# Patient Record
Sex: Male | Born: 1970 | Race: White | Hispanic: No | Smoking: Current every day smoker
Health system: Southern US, Community
[De-identification: ages and names within clinical notes are randomized; demographics above are authoritative.]

## PROBLEM LIST (undated history)

## (undated) ENCOUNTER — Emergency Department (HOSPITAL_COMMUNITY): Payer: Medicare Other | Source: Home / Self Care

## (undated) DIAGNOSIS — F32A Depression, unspecified: Secondary | ICD-10-CM

## (undated) DIAGNOSIS — I4901 Ventricular fibrillation: Secondary | ICD-10-CM

## (undated) DIAGNOSIS — R51 Headache: Principal | ICD-10-CM

## (undated) DIAGNOSIS — F99 Mental disorder, not otherwise specified: Secondary | ICD-10-CM

## (undated) DIAGNOSIS — I10 Essential (primary) hypertension: Secondary | ICD-10-CM

## (undated) DIAGNOSIS — F419 Anxiety disorder, unspecified: Secondary | ICD-10-CM

## (undated) DIAGNOSIS — I998 Other disorder of circulatory system: Secondary | ICD-10-CM

## (undated) DIAGNOSIS — M1990 Unspecified osteoarthritis, unspecified site: Secondary | ICD-10-CM

## (undated) DIAGNOSIS — I2510 Atherosclerotic heart disease of native coronary artery without angina pectoris: Secondary | ICD-10-CM

## (undated) DIAGNOSIS — G2581 Restless legs syndrome: Secondary | ICD-10-CM

## (undated) DIAGNOSIS — F329 Major depressive disorder, single episode, unspecified: Secondary | ICD-10-CM

## (undated) DIAGNOSIS — E119 Type 2 diabetes mellitus without complications: Secondary | ICD-10-CM

## (undated) DIAGNOSIS — K760 Fatty (change of) liver, not elsewhere classified: Secondary | ICD-10-CM

## (undated) DIAGNOSIS — K219 Gastro-esophageal reflux disease without esophagitis: Secondary | ICD-10-CM

## (undated) DIAGNOSIS — R0602 Shortness of breath: Secondary | ICD-10-CM

## (undated) DIAGNOSIS — I252 Old myocardial infarction: Secondary | ICD-10-CM

## (undated) DIAGNOSIS — E785 Hyperlipidemia, unspecified: Secondary | ICD-10-CM

## (undated) DIAGNOSIS — F319 Bipolar disorder, unspecified: Secondary | ICD-10-CM

## (undated) DIAGNOSIS — M47812 Spondylosis without myelopathy or radiculopathy, cervical region: Secondary | ICD-10-CM

## (undated) DIAGNOSIS — N189 Chronic kidney disease, unspecified: Secondary | ICD-10-CM

## (undated) DIAGNOSIS — D759 Disease of blood and blood-forming organs, unspecified: Secondary | ICD-10-CM

## (undated) HISTORY — PX: NASAL SINUS SURGERY: SHX719

## (undated) HISTORY — DX: Headache: R51

## (undated) HISTORY — DX: Type 2 diabetes mellitus without complications: E11.9

## (undated) HISTORY — DX: Depression, unspecified: F32.A

## (undated) HISTORY — DX: Spondylosis without myelopathy or radiculopathy, cervical region: M47.812

## (undated) HISTORY — DX: Hyperlipidemia, unspecified: E78.5

## (undated) HISTORY — DX: Major depressive disorder, single episode, unspecified: F32.9

---

## 2003-07-19 ENCOUNTER — Other Ambulatory Visit: Payer: Self-pay

## 2004-09-28 ENCOUNTER — Emergency Department: Payer: Self-pay | Admitting: Unknown Physician Specialty

## 2005-06-24 ENCOUNTER — Ambulatory Visit: Payer: Self-pay | Admitting: Family Medicine

## 2005-07-13 ENCOUNTER — Emergency Department: Payer: Self-pay | Admitting: Emergency Medicine

## 2005-07-13 ENCOUNTER — Other Ambulatory Visit: Payer: Self-pay

## 2005-07-15 ENCOUNTER — Emergency Department: Payer: Self-pay | Admitting: Emergency Medicine

## 2005-07-16 ENCOUNTER — Emergency Department: Payer: Self-pay | Admitting: Emergency Medicine

## 2005-07-17 ENCOUNTER — Emergency Department (HOSPITAL_COMMUNITY): Admission: EM | Admit: 2005-07-17 | Discharge: 2005-07-17 | Payer: Self-pay | Admitting: Emergency Medicine

## 2005-09-18 ENCOUNTER — Emergency Department: Payer: Self-pay | Admitting: Emergency Medicine

## 2005-11-09 ENCOUNTER — Ambulatory Visit: Payer: Self-pay | Admitting: Unknown Physician Specialty

## 2005-12-24 ENCOUNTER — Emergency Department: Payer: Self-pay | Admitting: Emergency Medicine

## 2006-01-07 ENCOUNTER — Emergency Department: Payer: Self-pay | Admitting: General Practice

## 2006-01-17 ENCOUNTER — Ambulatory Visit: Payer: Self-pay | Admitting: Unknown Physician Specialty

## 2006-01-23 ENCOUNTER — Other Ambulatory Visit: Payer: Self-pay

## 2006-01-23 ENCOUNTER — Ambulatory Visit: Payer: Self-pay | Admitting: Unknown Physician Specialty

## 2006-03-16 ENCOUNTER — Emergency Department: Payer: Self-pay | Admitting: Emergency Medicine

## 2006-04-04 ENCOUNTER — Emergency Department: Payer: Self-pay | Admitting: Emergency Medicine

## 2006-04-10 ENCOUNTER — Other Ambulatory Visit: Payer: Self-pay

## 2006-04-10 ENCOUNTER — Emergency Department: Payer: Self-pay | Admitting: Unknown Physician Specialty

## 2006-04-24 ENCOUNTER — Ambulatory Visit: Payer: Self-pay | Admitting: Otolaryngology

## 2006-04-27 ENCOUNTER — Emergency Department: Payer: Self-pay | Admitting: Internal Medicine

## 2006-06-07 ENCOUNTER — Emergency Department: Payer: Self-pay | Admitting: Emergency Medicine

## 2006-06-28 ENCOUNTER — Ambulatory Visit: Payer: Self-pay | Admitting: Maternal & Fetal Medicine

## 2006-07-20 ENCOUNTER — Emergency Department (HOSPITAL_COMMUNITY): Admission: EM | Admit: 2006-07-20 | Discharge: 2006-07-20 | Payer: Self-pay | Admitting: Emergency Medicine

## 2006-10-18 ENCOUNTER — Emergency Department: Payer: Self-pay | Admitting: Emergency Medicine

## 2006-10-20 ENCOUNTER — Emergency Department: Payer: Self-pay | Admitting: Emergency Medicine

## 2006-11-26 ENCOUNTER — Emergency Department: Payer: Self-pay | Admitting: Emergency Medicine

## 2006-12-14 ENCOUNTER — Emergency Department: Payer: Self-pay | Admitting: Emergency Medicine

## 2006-12-20 ENCOUNTER — Emergency Department (HOSPITAL_COMMUNITY): Admission: EM | Admit: 2006-12-20 | Discharge: 2006-12-20 | Payer: Self-pay | Admitting: Emergency Medicine

## 2007-01-14 ENCOUNTER — Emergency Department: Payer: Self-pay | Admitting: Emergency Medicine

## 2007-01-29 ENCOUNTER — Emergency Department: Payer: Self-pay | Admitting: Emergency Medicine

## 2007-03-13 ENCOUNTER — Emergency Department: Payer: Self-pay | Admitting: Emergency Medicine

## 2007-03-13 ENCOUNTER — Other Ambulatory Visit: Payer: Self-pay

## 2007-03-21 ENCOUNTER — Emergency Department: Payer: Self-pay | Admitting: Emergency Medicine

## 2007-03-21 ENCOUNTER — Other Ambulatory Visit: Payer: Self-pay

## 2007-04-14 ENCOUNTER — Emergency Department (HOSPITAL_COMMUNITY): Admission: EM | Admit: 2007-04-14 | Discharge: 2007-04-14 | Payer: Self-pay | Admitting: Emergency Medicine

## 2007-05-11 ENCOUNTER — Ambulatory Visit: Payer: Self-pay | Admitting: Family Medicine

## 2007-05-13 ENCOUNTER — Ambulatory Visit: Payer: Self-pay | Admitting: Family Medicine

## 2007-06-18 ENCOUNTER — Emergency Department: Payer: Self-pay | Admitting: Emergency Medicine

## 2007-08-14 ENCOUNTER — Emergency Department: Payer: Self-pay | Admitting: Internal Medicine

## 2007-08-15 ENCOUNTER — Emergency Department (HOSPITAL_COMMUNITY): Admission: EM | Admit: 2007-08-15 | Discharge: 2007-08-15 | Payer: Self-pay | Admitting: Emergency Medicine

## 2007-09-03 ENCOUNTER — Inpatient Hospital Stay: Payer: Self-pay | Admitting: Psychiatry

## 2007-10-06 ENCOUNTER — Other Ambulatory Visit: Payer: Self-pay

## 2007-10-06 ENCOUNTER — Emergency Department: Payer: Self-pay | Admitting: Emergency Medicine

## 2007-11-09 ENCOUNTER — Ambulatory Visit: Payer: Self-pay | Admitting: Internal Medicine

## 2007-11-25 ENCOUNTER — Emergency Department: Payer: Self-pay | Admitting: Emergency Medicine

## 2007-11-27 ENCOUNTER — Emergency Department: Payer: Self-pay | Admitting: Emergency Medicine

## 2008-03-02 ENCOUNTER — Emergency Department: Payer: Self-pay | Admitting: Unknown Physician Specialty

## 2008-03-22 ENCOUNTER — Emergency Department (HOSPITAL_COMMUNITY): Admission: EM | Admit: 2008-03-22 | Discharge: 2008-03-23 | Payer: Self-pay | Admitting: Emergency Medicine

## 2008-03-23 ENCOUNTER — Inpatient Hospital Stay (HOSPITAL_COMMUNITY): Admission: RE | Admit: 2008-03-23 | Discharge: 2008-03-23 | Payer: Self-pay | Admitting: *Deleted

## 2008-03-23 ENCOUNTER — Ambulatory Visit: Payer: Self-pay | Admitting: *Deleted

## 2008-08-12 ENCOUNTER — Emergency Department: Payer: Self-pay | Admitting: Internal Medicine

## 2008-09-06 ENCOUNTER — Emergency Department (HOSPITAL_COMMUNITY): Admission: EM | Admit: 2008-09-06 | Discharge: 2008-09-06 | Payer: Self-pay | Admitting: Emergency Medicine

## 2008-11-25 ENCOUNTER — Emergency Department: Payer: Self-pay | Admitting: Emergency Medicine

## 2009-01-10 ENCOUNTER — Emergency Department: Payer: Self-pay | Admitting: Internal Medicine

## 2009-02-27 ENCOUNTER — Emergency Department: Payer: Self-pay | Admitting: Internal Medicine

## 2009-04-25 ENCOUNTER — Emergency Department (HOSPITAL_COMMUNITY): Admission: EM | Admit: 2009-04-25 | Discharge: 2009-04-25 | Payer: Self-pay | Admitting: Emergency Medicine

## 2009-08-04 ENCOUNTER — Emergency Department: Payer: Self-pay | Admitting: Unknown Physician Specialty

## 2009-08-09 ENCOUNTER — Emergency Department: Payer: Self-pay | Admitting: Emergency Medicine

## 2009-08-10 ENCOUNTER — Ambulatory Visit: Payer: Self-pay | Admitting: Family Medicine

## 2009-08-11 ENCOUNTER — Emergency Department: Payer: Self-pay | Admitting: Unknown Physician Specialty

## 2009-08-12 ENCOUNTER — Emergency Department: Payer: Self-pay | Admitting: Emergency Medicine

## 2009-09-21 ENCOUNTER — Emergency Department (HOSPITAL_COMMUNITY): Admission: EM | Admit: 2009-09-21 | Discharge: 2009-09-21 | Payer: Self-pay | Admitting: Emergency Medicine

## 2009-09-23 ENCOUNTER — Emergency Department: Payer: Self-pay | Admitting: Emergency Medicine

## 2009-10-29 ENCOUNTER — Emergency Department: Payer: Self-pay | Admitting: Emergency Medicine

## 2010-05-20 ENCOUNTER — Emergency Department: Payer: Self-pay | Admitting: Emergency Medicine

## 2010-06-27 ENCOUNTER — Emergency Department: Payer: Self-pay | Admitting: Internal Medicine

## 2010-08-06 ENCOUNTER — Emergency Department: Payer: Self-pay | Admitting: Unknown Physician Specialty

## 2010-08-12 NOTE — Discharge Summary (Signed)
Michael Schmitt, Michael Schmitt             ACCOUNT NO.:  [id number]   MEDICAL RECORD NO.:  [id number]          PATIENT TYPE:  IPS   LOCATION:  0302                          FACILITY:  [facility]   PHYSICIAN:  Stark Jock, M.D. DATE OF BIRTH:  January 06, 1971   DATE OF ADMISSION:  03/23/2008  DATE OF DISCHARGE:  03/23/2008                               DISCHARGE SUMMARY   IDENTIFICATION:  This is a 40 year old single white male from [place],  [place], who was admitted on March 23, 2008 on a voluntary  basis.   HISTORY OF PRESENT ILLNESS:  The patient states father is very sick and  this has been a stress.  He has been feeling depressed.  He has been  arguing with his girlfriend.  He has not been helping around the house.  His fiancee told him he had to get help, so he came to the hospital.  He  has been having psychiatric problems since beginning panic attacks 3  years ago.  He has been treated with Klonopin and has had no more panic  attacks with the Klonopin.  He states he still worries a lot.  He had  been on Lexapro, which helped, but he cannot afford this.  He has been  on Cymbalta, which caused suicidal ideation, Wellbutrin, which caused  suicidal ideation, and Zoloft, which did not help.  He sees Dr. Collie Siad for  psychiatric treatment.  He admits that when he went to the ED, he told  the staff there he was suicidal.   PAST PSYCHIATRIC HISTORY:  The patient sees Dr. Collie Siad.  He has been  hospitalized x1 at [facility].  He has a  Social worker, named Designer, multimedia.  He has been treated with Klonopin.  He has been  treated by [facility] in [place].  For further past psychiatric history,  see above.   ALCOHOL AND DRUG HISTORY:  The patient denies.   FAMILY HISTORY:  His mother has depression.  Father was depressed to  sickness.  Grandmother was hospitalized at [facility] in the past.   MEDICAL PROBLEMS:  Gastritis, hypertension.   MEDICATIONS:  1. Atenolol 25 mg per day.  2.  Zantac.  3. Klonopin 0.5 mg p.o. b.i.d.   PHYSICAL FINDINGS:  There were no acute physical or medical problems  noted.   HOSPITAL COURSE:  Upon admission, the patient was restarted on atenolol  25 mg p.o. q. day.  He was also started on Klonopin 0.5 mg p.o. b.i.d.  and trazodone 50 mg p.o. q.h.s. p.r.n. may repeat x1 as well as a  nicotine patch 21 mg daily and Celexa 20 mg daily.  In individual  sessions, the patient was friendly and cooperative.  He stated he wanted  to go home.  He gave Education officer, museum permission to contact his Danice Goltz.  He wanted a family session prior to discharge, but social  worker explained that if the patient wants to go home today, they could  not have a family session prior to discharge due to no available times.  Social worker did speak with Threasa Beards over the phone and  answered her  questions.  Social worker explained both the patient and Threasa Beards that if  the patient wanted a formal family session, one could be scheduled for  tomorrow.  The patient was willing to hold off on discharge.  However,  they both decided it would be better to have the patient come home  today.  Social worker gave Threasa Beards and the patient the phone number to  the mental health association in [place].  He was also scheduled back  at [facility] in [place] with Ky Barban for counseling and Dr. Collie Siad for  medication management.  His fiancee, Threasa Beards was going to pick him up in  the afternoon.  He requested a letter to take home with his hospital  stay on it.  Mental status had improved.  The patient was less  depressed, less anxious.  Affect was consistent with his affect was  consistent with mood.  There was no suicidal or homicidal ideation.  No  thoughts of self-injurious behavior.  No auditory or visual  hallucinations.  No paranoia or delusions.  Thoughts were logical and  goal-directed.  Thought content no predominant theme.  Cognitive was  grossly intact.  Insight good.   Judgment good.  Impulse control good.  It was felt the patient was safe for discharge today.   DISCHARGE DIAGNOSES:  Axis I:  Mood disorder, not otherwise specified,  anxiety disorder.  Axis II:  None.  Axis III:  Hypertension and gastritis.  Axis IV:  Severe (problems with primary support, other psychosocial  problems, burden of psychiatric illness).  Axis V:  Global assessment of functioning was 50 upon discharge.  GAF  was 35 upon admission.  GAF highest past year was 89.   DISCHARGE PLANS:  There was no specific activity level or dietary  restrictions.   POSTHOSPITAL CARE PLANS:  The patient will return to see Dr. Collie Siad, at  [facility] in [place] on January 25 at 4:45 p.m.  He will also return to  [facility] Barban, [place] counselor on January 6th at 1 p.m.   DISCHARGE MEDICATIONS:  1. Celexa 20 mg daily.  2. Klonopin 0.5 mg twice a day.  3. Atenolol 25 mg daily.      Stark Jock, M.D.  Electronically Signed     BHS/MEDQ  D:  04/27/2008  T:  04/28/2008  Job:  [id number]

## 2010-09-15 ENCOUNTER — Emergency Department: Payer: Self-pay | Admitting: Emergency Medicine

## 2010-10-08 ENCOUNTER — Emergency Department: Payer: Self-pay | Admitting: Emergency Medicine

## 2010-10-16 ENCOUNTER — Emergency Department: Payer: Self-pay | Admitting: Emergency Medicine

## 2010-10-20 DIAGNOSIS — J45909 Unspecified asthma, uncomplicated: Secondary | ICD-10-CM | POA: Diagnosis present

## 2010-10-20 DIAGNOSIS — F17200 Nicotine dependence, unspecified, uncomplicated: Secondary | ICD-10-CM | POA: Insufficient documentation

## 2010-11-24 ENCOUNTER — Encounter: Payer: Self-pay | Admitting: Family Medicine

## 2010-11-26 ENCOUNTER — Encounter: Payer: Self-pay | Admitting: Family Medicine

## 2010-12-21 LAB — POCT I-STAT, CHEM 8
Calcium, Ion: 1.22
Glucose, Bld: 170 — ABNORMAL HIGH
HCT: 50
Hemoglobin: 17
Potassium: 4.1

## 2010-12-21 LAB — RAPID URINE DRUG SCREEN, HOSP PERFORMED: Opiates: NOT DETECTED

## 2010-12-21 LAB — ETHANOL

## 2010-12-25 ENCOUNTER — Emergency Department: Payer: Self-pay | Admitting: Emergency Medicine

## 2010-12-30 LAB — RAPID URINE DRUG SCREEN, HOSP PERFORMED
Amphetamines: NOT DETECTED
Benzodiazepines: POSITIVE — AB
Opiates: NOT DETECTED
Tetrahydrocannabinol: NOT DETECTED

## 2010-12-30 LAB — CBC
MCHC: 33.2 g/dL (ref 30.0–36.0)
Platelets: 182 10*3/uL (ref 150–400)
RDW: 12.9 % (ref 11.5–15.5)

## 2010-12-30 LAB — POCT I-STAT, CHEM 8
Calcium, Ion: 1.07 mmol/L — ABNORMAL LOW (ref 1.12–1.32)
Creatinine, Ser: 1 mg/dL (ref 0.4–1.5)
Glucose, Bld: 112 mg/dL — ABNORMAL HIGH (ref 70–99)
Hemoglobin: 15.3 g/dL (ref 13.0–17.0)
TCO2: 25 mmol/L (ref 0–100)

## 2010-12-30 LAB — DIFFERENTIAL
Basophils Absolute: 0 10*3/uL (ref 0.0–0.1)
Basophils Relative: 1 % (ref 0–1)
Monocytes Absolute: 0.5 10*3/uL (ref 0.1–1.0)
Neutro Abs: 3.7 10*3/uL (ref 1.7–7.7)
Neutrophils Relative %: 52 % (ref 43–77)

## 2010-12-30 LAB — ETHANOL

## 2011-01-05 LAB — URINE CULTURE: Colony Count: NO GROWTH

## 2011-01-05 LAB — COMPREHENSIVE METABOLIC PANEL
ALT: 48
Albumin: 4.1
Alkaline Phosphatase: 75
BUN: 11
Chloride: 102
Potassium: 4.4
Sodium: 139
Total Bilirubin: 0.8
Total Protein: 6.6

## 2011-01-05 LAB — DIFFERENTIAL
Basophils Relative: 1
Eosinophils Absolute: 0.2
Eosinophils Relative: 2
Lymphs Abs: 2.5
Monocytes Absolute: 0.5
Monocytes Relative: 6
Neutrophils Relative %: 59

## 2011-01-05 LAB — CBC
Hemoglobin: 15.4
MCHC: 33.9
RBC: 5.01
RDW: 13.6

## 2011-01-05 LAB — URINALYSIS, ROUTINE W REFLEX MICROSCOPIC
Bilirubin Urine: NEGATIVE
Glucose, UA: NEGATIVE
Hgb urine dipstick: NEGATIVE
Protein, ur: NEGATIVE

## 2011-01-26 ENCOUNTER — Emergency Department: Payer: Self-pay | Admitting: Emergency Medicine

## 2011-01-30 ENCOUNTER — Emergency Department: Payer: Self-pay

## 2011-04-16 ENCOUNTER — Emergency Department: Payer: Self-pay | Admitting: Emergency Medicine

## 2011-05-08 ENCOUNTER — Emergency Department: Payer: Self-pay | Admitting: Emergency Medicine

## 2011-05-17 ENCOUNTER — Emergency Department: Payer: Self-pay

## 2011-05-18 ENCOUNTER — Emergency Department: Payer: Self-pay | Admitting: Emergency Medicine

## 2011-06-06 ENCOUNTER — Ambulatory Visit: Payer: Self-pay | Admitting: Pain Medicine

## 2011-06-16 ENCOUNTER — Ambulatory Visit: Payer: Self-pay | Admitting: Pain Medicine

## 2011-06-21 ENCOUNTER — Ambulatory Visit: Payer: Self-pay | Admitting: Pain Medicine

## 2011-07-05 ENCOUNTER — Ambulatory Visit: Payer: Self-pay | Admitting: Pain Medicine

## 2011-07-19 ENCOUNTER — Ambulatory Visit: Payer: Self-pay | Admitting: Pain Medicine

## 2011-09-29 ENCOUNTER — Ambulatory Visit: Payer: Self-pay

## 2011-10-04 ENCOUNTER — Emergency Department: Payer: Self-pay | Admitting: Internal Medicine

## 2011-10-04 LAB — BASIC METABOLIC PANEL
Anion Gap: 7 (ref 7–16)
BUN: 6 mg/dL — ABNORMAL LOW (ref 7–18)
Co2: 27 mmol/L (ref 21–32)
Creatinine: 1.03 mg/dL (ref 0.60–1.30)
EGFR (African American): >60
EGFR (Non-African Amer.): >60
Sodium: 142 mmol/L (ref 136–145)

## 2011-10-04 LAB — URINALYSIS, COMPLETE
Bilirubin,UR: NEGATIVE
Blood: NEGATIVE
Ketone: NEGATIVE
Leukocyte Esterase: NEGATIVE
Ph: 6 (ref 4.5–8.0)
Protein: NEGATIVE
RBC,UR: NONE SEEN /HPF (ref 0–5)
Squamous Epithelial: <1

## 2011-10-04 LAB — CBC
HGB: 15.9 g/dL (ref 13.0–18.0)
MCH: 31.6 pg (ref 26.0–34.0)
Platelet: 202 10*3/uL (ref 150–440)
RBC: 5.03 10*6/uL (ref 4.40–5.90)

## 2011-11-01 ENCOUNTER — Other Ambulatory Visit: Payer: Self-pay | Admitting: Neurosurgery

## 2011-11-06 ENCOUNTER — Encounter (HOSPITAL_COMMUNITY): Payer: Self-pay

## 2011-11-09 ENCOUNTER — Encounter (HOSPITAL_COMMUNITY)
Admission: RE | Admit: 2011-11-09 | Discharge: 2011-11-09 | Disposition: A | Discharge status: Home / Self Care | Payer: Medicaid Other | Source: Ambulatory Visit | Attending: Neurosurgery | Admitting: Neurosurgery

## 2011-11-09 ENCOUNTER — Encounter (HOSPITAL_COMMUNITY): Payer: Self-pay

## 2011-11-09 ENCOUNTER — Ambulatory Visit (HOSPITAL_COMMUNITY)
Admission: RE | Admit: 2011-11-09 | Discharge: 2011-11-09 | Disposition: A | Discharge status: Home / Self Care | Payer: Medicaid Other | Source: Ambulatory Visit | Attending: Neurosurgery | Admitting: Neurosurgery

## 2011-11-09 DIAGNOSIS — Z01810 Encounter for preprocedural cardiovascular examination: Secondary | ICD-10-CM | POA: Insufficient documentation

## 2011-11-09 DIAGNOSIS — F17200 Nicotine dependence, unspecified, uncomplicated: Secondary | ICD-10-CM | POA: Insufficient documentation

## 2011-11-09 DIAGNOSIS — Z01812 Encounter for preprocedural laboratory examination: Secondary | ICD-10-CM | POA: Insufficient documentation

## 2011-11-09 DIAGNOSIS — Z01818 Encounter for other preprocedural examination: Secondary | ICD-10-CM | POA: Insufficient documentation

## 2011-11-09 DIAGNOSIS — I10 Essential (primary) hypertension: Secondary | ICD-10-CM | POA: Insufficient documentation

## 2011-11-09 DIAGNOSIS — K219 Gastro-esophageal reflux disease without esophagitis: Secondary | ICD-10-CM | POA: Insufficient documentation

## 2011-11-09 DIAGNOSIS — R0602 Shortness of breath: Secondary | ICD-10-CM | POA: Insufficient documentation

## 2011-11-09 HISTORY — DX: Unspecified osteoarthritis, unspecified site: M19.90

## 2011-11-09 HISTORY — DX: Gastro-esophageal reflux disease without esophagitis: K21.9

## 2011-11-09 HISTORY — DX: Headache: R51

## 2011-11-09 HISTORY — DX: Restless legs syndrome: G25.81

## 2011-11-09 HISTORY — DX: Essential (primary) hypertension: I10

## 2011-11-09 HISTORY — DX: Anxiety disorder, unspecified: F41.9

## 2011-11-09 HISTORY — DX: Chronic kidney disease, unspecified: N18.9

## 2011-11-09 HISTORY — DX: Mental disorder, not otherwise specified: F99

## 2011-11-09 HISTORY — DX: Disease of blood and blood-forming organs, unspecified: D75.9

## 2011-11-09 HISTORY — DX: Shortness of breath: R06.02

## 2011-11-09 LAB — CBC
HCT: 47.1 % (ref 39.0–52.0)
MCV: 92.2 fL (ref 78.0–100.0)
RBC: 5.11 MIL/uL (ref 4.22–5.81)
WBC: 6.7 10*3/uL (ref 4.0–10.5)

## 2011-11-09 LAB — BASIC METABOLIC PANEL
CO2: 32 mEq/L (ref 19–32)
Chloride: 101 mEq/L (ref 96–112)
Potassium: 3.7 mEq/L (ref 3.5–5.1)
Sodium: 142 mEq/L (ref 135–145)

## 2011-11-09 LAB — SURGICAL PCR SCREEN: Staphylococcus aureus: NEGATIVE

## 2011-11-09 NOTE — Progress Notes (Signed)
ekg- last one done- [facility], June 2013

## 2011-11-09 NOTE — Pre-Procedure Instructions (Signed)
[place]  11/09/2011   Your procedure is scheduled on:  11/13/2011  Report to [facility] at 11:45 AM.  Call this number if you have problems the morning of surgery: [phone]   Remember:   Do not eat food or drink liquid :After Midnight.-  TUESDAY      Take these medicines the morning of surgery with A SIP OF WATER: pain medicine if needed, atenolol, klonipin, lexapro, prilosec     Do not wear jewelry, make-up or nail polish.   Do not wear lotions, powders, or perfumes. You may wear deodorant.   Men may shave face and neck.   Do not bring valuables to the hospital.   Contacts, dentures or bridgework may not be worn into surgery.   Leave suitcase in the car. After surgery it may be brought to your room.  For patients admitted to the hospital, checkout time is 11:00 AM the day of discharge.   Patients discharged the day of surgery will not be allowed to drive home.  Name and phone number of your driver: /w family  Special Instructions: CHG Shower Use Special Wash: 1/2 bottle night before surgery and 1/2 bottle morning of surgery.   Please read over the following fact sheets that you were given: Pain Booklet, Coughing and Deep Breathing, MRSA Information and Surgical Site Infection Prevention

## 2011-11-09 NOTE — Progress Notes (Signed)
Pt. Reports that the Vicodin is not helping his pain at all. Pt. States that there is an Rx of Oxycodone waiting for him in the MD office. He also reports that he has not been taking the atenolol the last several days because he ran out & he has contacted the MD office but "they have messed up all my medicines".   Pt. Reports that he has been out of work for 2 yrs+ & can't afford some of the medicines.  Also reports lapsing on Lexapro & Remeron, states he sees a Teacher, music & a counselor but missed appt. yesterday because of pain. Pt. Admits to feeling hopeless but denies thoughts of harming himself.  States he took his normal Klonopin upon waking today & then a couple hrs. later took another because he knew he was coming to the hosp. For PAT visit & he was nervous.   Pt. Has been advised to get Atenolol filled & take as prescribed. Pt. Agrees.

## 2011-11-12 MED ORDER — CEFAZOLIN SODIUM-DEXTROSE 2-3 GM-% IV SOLR
2.0000 g | INTRAVENOUS | Status: AC
  Administered 2011-11-13: 2 g via INTRAVENOUS
  Filled 2011-11-12 (×2): qty 50

## 2011-11-13 ENCOUNTER — Encounter (HOSPITAL_COMMUNITY): Payer: Self-pay | Admitting: Certified Registered Nurse Anesthetist

## 2011-11-13 ENCOUNTER — Ambulatory Visit (HOSPITAL_COMMUNITY): Payer: Medicaid Other | Admitting: Certified Registered Nurse Anesthetist

## 2011-11-13 ENCOUNTER — Encounter (HOSPITAL_COMMUNITY)
Admission: RE | Disposition: A | Discharge status: Home / Self Care | Payer: Self-pay | Source: Ambulatory Visit | Attending: Neurosurgery

## 2011-11-13 ENCOUNTER — Ambulatory Visit (HOSPITAL_COMMUNITY): Payer: Medicaid Other

## 2011-11-13 ENCOUNTER — Encounter (HOSPITAL_COMMUNITY): Payer: Self-pay | Admitting: *Deleted

## 2011-11-13 ENCOUNTER — Ambulatory Visit (HOSPITAL_COMMUNITY)
Admission: RE | Admit: 2011-11-13 | Discharge: 2011-11-14 | Disposition: A | Discharge status: Home / Self Care | Payer: Medicaid Other | Source: Ambulatory Visit | Attending: Neurosurgery | Admitting: Neurosurgery

## 2011-11-13 DIAGNOSIS — K219 Gastro-esophageal reflux disease without esophagitis: Secondary | ICD-10-CM | POA: Insufficient documentation

## 2011-11-13 DIAGNOSIS — F17200 Nicotine dependence, unspecified, uncomplicated: Secondary | ICD-10-CM | POA: Insufficient documentation

## 2011-11-13 DIAGNOSIS — J45909 Unspecified asthma, uncomplicated: Secondary | ICD-10-CM | POA: Insufficient documentation

## 2011-11-13 DIAGNOSIS — M47812 Spondylosis without myelopathy or radiculopathy, cervical region: Secondary | ICD-10-CM | POA: Insufficient documentation

## 2011-11-13 DIAGNOSIS — I10 Essential (primary) hypertension: Secondary | ICD-10-CM | POA: Insufficient documentation

## 2011-11-13 HISTORY — PX: ANTERIOR CERVICAL DECOMP/DISCECTOMY FUSION: SHX1161

## 2011-11-13 SURGERY — ANTERIOR CERVICAL DECOMPRESSION/DISCECTOMY FUSION 1 LEVEL
Anesthesia: General | Wound class: Clean

## 2011-11-13 MED ORDER — CEFAZOLIN SODIUM 1-5 GM-% IV SOLN
1.0000 g | Freq: Three times a day (TID) | INTRAVENOUS | Status: AC
  Administered 2011-11-13 – 2011-11-14 (×2): 1 g via INTRAVENOUS
  Filled 2011-11-13 (×2): qty 50

## 2011-11-13 MED ORDER — PROPOFOL 10 MG/ML IV BOLUS
INTRAVENOUS | Status: DC | PRN
  Administered 2011-11-13: 200 mg via INTRAVENOUS

## 2011-11-13 MED ORDER — LIDOCAINE HCL (CARDIAC) 20 MG/ML IV SOLN
INTRAVENOUS | Status: DC | PRN
  Administered 2011-11-13: 20 mg via INTRAVENOUS

## 2011-11-13 MED ORDER — DEXAMETHASONE SODIUM PHOSPHATE 10 MG/ML IJ SOLN
10.0000 mg | Freq: Four times a day (QID) | INTRAMUSCULAR | Status: AC
  Administered 2011-11-13 (×2): 10 mg via INTRAVENOUS
  Filled 2011-11-13 (×3): qty 1

## 2011-11-13 MED ORDER — NEOSTIGMINE METHYLSULFATE 1 MG/ML IJ SOLN
INTRAMUSCULAR | Status: DC | PRN
  Administered 2011-11-13: 4 mg via INTRAVENOUS

## 2011-11-13 MED ORDER — BACITRACIN 50000 UNITS IM SOLR
INTRAMUSCULAR | Status: AC
  Filled 2011-11-13: qty 1

## 2011-11-13 MED ORDER — DOCUSATE SODIUM 100 MG PO CAPS
100.0000 mg | ORAL_CAPSULE | Freq: Two times a day (BID) | ORAL | Status: DC
  Administered 2011-11-13: 100 mg via ORAL
  Filled 2011-11-13: qty 1

## 2011-11-13 MED ORDER — HYDROMORPHONE HCL PF 1 MG/ML IJ SOLN
INTRAMUSCULAR | Status: AC
  Filled 2011-11-13: qty 1

## 2011-11-13 MED ORDER — MIRTAZAPINE 7.5 MG PO TABS
22.5000 mg | ORAL_TABLET | Freq: Every day | ORAL | Status: DC
  Administered 2011-11-13: 22.5 mg via ORAL
  Filled 2011-11-13 (×2): qty 1

## 2011-11-13 MED ORDER — SODIUM CHLORIDE 0.9 % IV SOLN
INTRAVENOUS | Status: AC
  Filled 2011-11-13: qty 500

## 2011-11-13 MED ORDER — MENTHOL 3 MG MT LOZG
1.0000 | LOZENGE | OROMUCOSAL | Status: DC | PRN
  Filled 2011-11-13: qty 9

## 2011-11-13 MED ORDER — HEMOSTATIC AGENTS (NO CHARGE) OPTIME
TOPICAL | Status: DC | PRN
  Administered 2011-11-13: 1 via TOPICAL

## 2011-11-13 MED ORDER — CLONAZEPAM 0.5 MG PO TABS: 1.0000 mg | ORAL_TABLET | Freq: Every day | ORAL | Status: DC

## 2011-11-13 MED ORDER — ROCURONIUM BROMIDE 100 MG/10ML IV SOLN
INTRAVENOUS | Status: DC | PRN
  Administered 2011-11-13: 10 mg via INTRAVENOUS
  Administered 2011-11-13: 50 mg via INTRAVENOUS
  Administered 2011-11-13 (×2): 10 mg via INTRAVENOUS

## 2011-11-13 MED ORDER — THROMBIN 5000 UNITS EX SOLR
OROMUCOSAL | Status: DC | PRN
  Administered 2011-11-13: 14:00:00 via TOPICAL

## 2011-11-13 MED ORDER — OXYCODONE-ACETAMINOPHEN 5-325 MG PO TABS
1.0000 | ORAL_TABLET | ORAL | Status: DC | PRN
  Administered 2011-11-13 – 2011-11-14 (×3): 2 via ORAL
  Filled 2011-11-13 (×3): qty 2

## 2011-11-13 MED ORDER — SODIUM CHLORIDE 0.9 % IJ SOLN
3.0000 mL | Freq: Two times a day (BID) | INTRAMUSCULAR | Status: DC
  Administered 2011-11-13: 3 mL via INTRAVENOUS

## 2011-11-13 MED ORDER — MIDAZOLAM HCL 2 MG/2ML IJ SOLN: 0.5000 mg | Freq: Once | INTRAMUSCULAR | Status: DC | PRN

## 2011-11-13 MED ORDER — LORATADINE 10 MG PO TABS
10.0000 mg | ORAL_TABLET | Freq: Every day | ORAL | Status: DC
  Filled 2011-11-13 (×2): qty 1

## 2011-11-13 MED ORDER — HYDROCODONE-ACETAMINOPHEN 5-325 MG PO TABS: 1.0000 | ORAL_TABLET | ORAL | Status: DC | PRN

## 2011-11-13 MED ORDER — SODIUM CHLORIDE 0.9 % IV SOLN: 250.0000 mL | INTRAVENOUS | Status: DC

## 2011-11-13 MED ORDER — 0.9 % SODIUM CHLORIDE (POUR BTL) OPTIME
TOPICAL | Status: DC | PRN
  Administered 2011-11-13: 1000 mL

## 2011-11-13 MED ORDER — MIRTAZAPINE 15 MG PO TABS: 22.5000 mg | ORAL_TABLET | Freq: Every day | ORAL | Status: DC

## 2011-11-13 MED ORDER — HYDROMORPHONE HCL PF 1 MG/ML IJ SOLN
0.2500 mg | INTRAMUSCULAR | Status: DC | PRN
  Administered 2011-11-13 (×4): 0.5 mg via INTRAVENOUS

## 2011-11-13 MED ORDER — THROMBIN 5000 UNITS EX KIT
PACK | CUTANEOUS | Status: DC | PRN
  Administered 2011-11-13 (×2): 5000 [IU] via TOPICAL

## 2011-11-13 MED ORDER — PROMETHAZINE HCL 25 MG/ML IJ SOLN: 6.2500 mg | INTRAMUSCULAR | Status: DC | PRN

## 2011-11-13 MED ORDER — CYCLOBENZAPRINE HCL 10 MG PO TABS
10.0000 mg | ORAL_TABLET | Freq: Three times a day (TID) | ORAL | Status: DC | PRN
  Administered 2011-11-13: 10 mg via ORAL
  Filled 2011-11-13: qty 1

## 2011-11-13 MED ORDER — LACTATED RINGERS IV SOLN
INTRAVENOUS | Status: DC | PRN
  Administered 2011-11-13 (×2): via INTRAVENOUS

## 2011-11-13 MED ORDER — ONDANSETRON HCL 4 MG/2ML IJ SOLN
INTRAMUSCULAR | Status: DC | PRN
  Administered 2011-11-13: 4 mg via INTRAVENOUS

## 2011-11-13 MED ORDER — ACETAMINOPHEN 325 MG PO TABS: 650.0000 mg | ORAL_TABLET | ORAL | Status: DC | PRN

## 2011-11-13 MED ORDER — PANTOPRAZOLE SODIUM 40 MG PO TBEC
40.0000 mg | DELAYED_RELEASE_TABLET | Freq: Every day | ORAL | Status: DC
  Administered 2011-11-14: 40 mg via ORAL
  Filled 2011-11-13: qty 1

## 2011-11-13 MED ORDER — SODIUM CHLORIDE 0.9 % IJ SOLN: 3.0000 mL | INTRAMUSCULAR | Status: DC | PRN

## 2011-11-13 MED ORDER — HYDROMORPHONE HCL PF 1 MG/ML IJ SOLN: 0.5000 mg | INTRAMUSCULAR | Status: DC | PRN

## 2011-11-13 MED ORDER — SODIUM CHLORIDE 0.9 % IR SOLN
Status: DC | PRN
  Administered 2011-11-13: 14:00:00

## 2011-11-13 MED ORDER — MIDAZOLAM HCL 5 MG/5ML IJ SOLN
INTRAMUSCULAR | Status: DC | PRN
  Administered 2011-11-13: 2 mg via INTRAVENOUS

## 2011-11-13 MED ORDER — PANTOPRAZOLE SODIUM 40 MG PO TBEC: 40.0000 mg | DELAYED_RELEASE_TABLET | Freq: Every day | ORAL | Status: DC

## 2011-11-13 MED ORDER — ESCITALOPRAM OXALATE 20 MG PO TABS
20.0000 mg | ORAL_TABLET | Freq: Every day | ORAL | Status: DC
  Filled 2011-11-13 (×2): qty 1

## 2011-11-13 MED ORDER — MIRTAZAPINE 7.5 MG PO TABS
7.5000 mg | ORAL_TABLET | Freq: Every evening | ORAL | Status: DC | PRN
  Filled 2011-11-13: qty 1

## 2011-11-13 MED ORDER — DEXAMETHASONE SODIUM PHOSPHATE 10 MG/ML IJ SOLN
INTRAMUSCULAR | Status: DC | PRN
  Administered 2011-11-13: 10 mg via INTRAVENOUS

## 2011-11-13 MED ORDER — GLYCOPYRROLATE 0.2 MG/ML IJ SOLN
INTRAMUSCULAR | Status: DC | PRN
  Administered 2011-11-13: .8 mg via INTRAVENOUS

## 2011-11-13 MED ORDER — CLONAZEPAM 0.5 MG PO TABS
1.0000 mg | ORAL_TABLET | Freq: Three times a day (TID) | ORAL | Status: DC | PRN
  Administered 2011-11-13 – 2011-11-14 (×2): 1 mg via ORAL
  Filled 2011-11-13 (×2): qty 2

## 2011-11-13 MED ORDER — ONDANSETRON HCL 4 MG/2ML IJ SOLN: 4.0000 mg | INTRAMUSCULAR | Status: DC | PRN

## 2011-11-13 MED ORDER — ACETAMINOPHEN 650 MG RE SUPP: 650.0000 mg | RECTAL | Status: DC | PRN

## 2011-11-13 MED ORDER — MEPERIDINE HCL 25 MG/ML IJ SOLN: 6.2500 mg | INTRAMUSCULAR | Status: DC | PRN

## 2011-11-13 MED ORDER — FENTANYL CITRATE 0.05 MG/ML IJ SOLN
INTRAMUSCULAR | Status: DC | PRN
  Administered 2011-11-13: 250 ug via INTRAVENOUS

## 2011-11-13 MED ORDER — ATENOLOL 25 MG PO TABS
25.0000 mg | ORAL_TABLET | Freq: Every day | ORAL | Status: DC
  Administered 2011-11-14: 25 mg via ORAL
  Filled 2011-11-13 (×2): qty 1

## 2011-11-13 MED ORDER — ALUM & MAG HYDROXIDE-SIMETH 200-200-20 MG/5ML PO SUSP: 30.0000 mL | Freq: Four times a day (QID) | ORAL | Status: DC | PRN

## 2011-11-13 MED ORDER — PHENOL 1.4 % MT LIQD
1.0000 | OROMUCOSAL | Status: DC | PRN
  Filled 2011-11-13: qty 177

## 2011-11-13 SURGICAL SUPPLY — 62 items
ADH SKN CLS APL DERMABOND .7 (GAUZE/BANDAGES/DRESSINGS) ×1
APL SKNCLS STERI-STRIP NONHPOA (GAUZE/BANDAGES/DRESSINGS) ×1
BAG DECANTER FOR FLEXI CONT (MISCELLANEOUS) ×2 IMPLANT
BENZOIN TINCTURE PRP APPL 2/3 (GAUZE/BANDAGES/DRESSINGS) ×2 IMPLANT
BIT DRILL SPINE QC 12 (BIT) ×1 IMPLANT
BRUSH SCRUB EZ PLAIN DRY (MISCELLANEOUS) ×2 IMPLANT
BUR MATCHSTICK NEURO 3.0 LAGG (BURR) ×2 IMPLANT
CANISTER SUCTION 2500CC (MISCELLANEOUS) ×2 IMPLANT
CLOTH BEACON ORANGE TIMEOUT ST (SAFETY) ×2 IMPLANT
CONT SPEC 4OZ CLIKSEAL STRL BL (MISCELLANEOUS) ×2 IMPLANT
DERMABOND ADVANCED (GAUZE/BANDAGES/DRESSINGS) ×1
DERMABOND ADVANCED .7 DNX12 (GAUZE/BANDAGES/DRESSINGS) IMPLANT
DRAPE C-ARM 42X72 X-RAY (DRAPES) ×4 IMPLANT
DRAPE LAPAROTOMY 100X72 PEDS (DRAPES) ×2 IMPLANT
DRAPE MICROSCOPE ZEISS OPMI (DRAPES) ×2 IMPLANT
DRAPE POUCH INSTRU U-SHP 10X18 (DRAPES) ×2 IMPLANT
DRSG OPSITE 4X5.5 SM (GAUZE/BANDAGES/DRESSINGS) ×2 IMPLANT
ELECT COATED BLADE 2.86 ST (ELECTRODE) ×2 IMPLANT
ELECT REM PT RETURN 9FT ADLT (ELECTROSURGICAL) ×2
ELECTRODE REM PT RTRN 9FT ADLT (ELECTROSURGICAL) ×1 IMPLANT
GAUZE SPONGE 4X4 16PLY XRAY LF (GAUZE/BANDAGES/DRESSINGS) IMPLANT
GLOVE BIO SURGEON STRL SZ8 (GLOVE) ×2 IMPLANT
GLOVE BIOGEL PI IND STRL 8.5 (GLOVE) IMPLANT
GLOVE BIOGEL PI INDICATOR 8.5 (GLOVE) ×1
GLOVE EXAM NITRILE LRG STRL (GLOVE) IMPLANT
GLOVE EXAM NITRILE MD LF STRL (GLOVE) IMPLANT
GLOVE EXAM NITRILE XL STR (GLOVE) IMPLANT
GLOVE EXAM NITRILE XS STR PU (GLOVE) IMPLANT
GLOVE INDICATOR 7.5 STRL GRN (GLOVE) ×1 IMPLANT
GLOVE INDICATOR 8.5 STRL (GLOVE) ×2 IMPLANT
GLOVE SURG SS PI 8.0 STRL IVOR (GLOVE) ×1 IMPLANT
GOWN BRE IMP SLV AUR LG STRL (GOWN DISPOSABLE) ×2 IMPLANT
GOWN BRE IMP SLV AUR XL STRL (GOWN DISPOSABLE) ×2 IMPLANT
GOWN STRL REIN 2XL LVL4 (GOWN DISPOSABLE) ×2 IMPLANT
HEAD HALTER (SOFTGOODS) ×2 IMPLANT
HEMOSTAT POWDER KIT SURGIFOAM (HEMOSTASIS) ×2 IMPLANT
KIT BASIN OR (CUSTOM PROCEDURE TRAY) ×2 IMPLANT
KIT ROOM TURNOVER OR (KITS) ×2 IMPLANT
NDL HYPO 18GX1.5 BLUNT FILL (NEEDLE) ×1 IMPLANT
NDL SPNL 20GX3.5 QUINCKE YW (NEEDLE) ×1 IMPLANT
NEEDLE HYPO 18GX1.5 BLUNT FILL (NEEDLE) ×2 IMPLANT
NEEDLE SPNL 20GX3.5 QUINCKE YW (NEEDLE) ×2 IMPLANT
NS IRRIG 1000ML POUR BTL (IV SOLUTION) ×2 IMPLANT
PACK LAMINECTOMY NEURO (CUSTOM PROCEDURE TRAY) ×2 IMPLANT
PAD ARMBOARD 7.5X6 YLW CONV (MISCELLANEOUS) ×5 IMPLANT
PLATE ANT CERV XTEND 1 LV 14 (Plate) ×1 IMPLANT
PUTTY BONE DBX 2.5 MIS (Bone Implant) ×1 IMPLANT
RUBBERBAND STERILE (MISCELLANEOUS) ×4 IMPLANT
SCREW XTD VAR 4.2 SELF TAP 12 (Screw) ×4 IMPLANT
SPACER COLONIAL 7 SZ 9 (Spacer) ×1 IMPLANT
SPONGE GAUZE 4X4 12PLY (GAUZE/BANDAGES/DRESSINGS) ×2 IMPLANT
SPONGE INTESTINAL PEANUT (DISPOSABLE) ×2 IMPLANT
SPONGE SURGIFOAM ABS GEL SZ50 (HEMOSTASIS) ×2 IMPLANT
STRIP CLOSURE SKIN 1/2X4 (GAUZE/BANDAGES/DRESSINGS) ×2 IMPLANT
SUT VIC AB 3-0 SH 8-18 (SUTURE) ×2 IMPLANT
SUT VICRYL 4-0 PS2 18IN ABS (SUTURE) ×2 IMPLANT
SYR 20ML ECCENTRIC (SYRINGE) ×2 IMPLANT
TAPE CLOTH 4X10 WHT NS (GAUZE/BANDAGES/DRESSINGS) IMPLANT
TOWEL OR 17X24 6PK STRL BLUE (TOWEL DISPOSABLE) ×2 IMPLANT
TOWEL OR 17X26 10 PK STRL BLUE (TOWEL DISPOSABLE) ×2 IMPLANT
TRAP SPECIMEN MUCOUS 40CC (MISCELLANEOUS) ×2 IMPLANT
WATER STERILE IRR 1000ML POUR (IV SOLUTION) ×2 IMPLANT

## 2011-11-13 NOTE — Anesthesia Postprocedure Evaluation (Signed)
  Anesthesia Post-op Note  Patient: Michael Schmitt  Procedure(s) Performed: Procedure(s) (LRB): ANTERIOR CERVICAL DECOMPRESSION/DISCECTOMY FUSION 1 LEVEL (N/A)  Patient Location: PACU  Anesthesia Type: General  Level of Consciousness: awake, alert  and oriented  Airway and Oxygen Therapy: Patient Spontanous Breathing  Post-op Pain: mild  Post-op Assessment: Post-op Vital signs reviewed, Patient's Cardiovascular Status Stable, Respiratory Function Stable, Patent Airway, No signs of Nausea or vomiting and Pain level controlled  Post-op Vital Signs: Reviewed and stable  Complications: No apparent anesthesia complications

## 2011-11-13 NOTE — Plan of Care (Signed)
Problem: Consults Goal: Diagnosis - Spinal Surgery Outcome: Completed/Met Date Met:  11/13/11 Cervical Spine Fusion

## 2011-11-13 NOTE — H&P (Signed)
Michael Schmitt is an 41 y.o. male.   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 I also explained perioperative course and expectations of outcome alternatives surgery.  Past Medical History  Diagnosis Date  . Hypertension   . Mental disorder   . Anxiety   . Shortness of breath   . RLS (restless legs syndrome)     detected on sleep study  . GERD (gastroesophageal reflux disease)   . Headache   . Blood dyscrasia     told that when he was young he was a" free bleeder"  . Arthritis     cerv. stenosis, spondylosis, HNP- lower back , has been followed in pain clinic, has  had injection s in cerv. area  . Chronic kidney disease     renal calculi- passed spontaneously    Past Surgical History  Procedure Date  . Nasal sinus surgery     2005    History reviewed. No pertinent family history. Social History:  reports that he has been smoking.  He does not have any smokeless tobacco history on file. He reports that he drinks alcohol. He reports that he does not use illicit drugs.  Allergies:  Allergies  Allergen Reactions  . Hydrocodone     Headache, irritable    Medications Prior to Admission  Medication Sig Dispense Refill  . atenolol (TENORMIN) 25 MG tablet Take 25 mg by mouth daily before breakfast.       . cetirizine (ZYRTEC) 10 MG tablet Take 10 mg by mouth daily.      . clonazePAM (KLONOPIN) 1  MG tablet Take 1 mg by mouth 3 (three) times daily as needed. For anxiety      . escitalopram (LEXAPRO) 20 MG tablet Take 20 mg by mouth daily.      . mirtazapine (REMERON) 15 MG tablet Take 22.5-30 mg by mouth at bedtime. 1.5 tablets - 2 tablets for sleep. If 1.5 tablets doesn't make him fall asleep he will take the remaining 0.5 tablet      . omeprazole (PRILOSEC) 40 MG capsule Take 40 mg by mouth daily before breakfast.       . aspirin 325 MG tablet Take 650 mg by mouth every 4 (four) hours as needed. Remarks that its BC powder that he used 3-4 times per day, until 11/06/2011      . HYDROcodone-acetaminophen (NORCO/VICODIN) 5-325 MG per tablet Take 1-2 tablets by mouth every 4 (four) hours as needed.        No results found for this or any previous visit (from the past 48 hour(s)). No results found.  Review of Systems  Constitutional: Negative.   HENT: Positive for neck pain.   Eyes: Negative.   Respiratory: Negative.   Cardiovascular: Negative.   Gastrointestinal: Negative.   Genitourinary: Negative.   Musculoskeletal: Positive for myalgias and joint pain.  Skin: Negative.   Neurological: Positive for sensory change.  Endo/Heme/Allergies: Negative.     Blood pressure 134/82,  pulse 69, temperature 98.3 F (36.8 C), temperature source Oral, resp. rate 16, SpO2 98.00%. Physical Exam  Constitutional: He is oriented to person, place, and time.  HENT:  Head: Normocephalic and atraumatic.  Eyes: Pupils are equal, round, and reactive to light.  Cardiovascular: Normal rate.   Respiratory: Breath sounds normal.  GI: Soft.  Musculoskeletal: Normal range of motion.  Neurological: He is alert and oriented to person, place, and time. He has normal strength. He displays a negative Romberg sign. GCS eye subscore is 4. GCS verbal subscore is 5. GCS motor subscore is 6.  Reflex Scores:      Tricep reflexes are 2+ on the right side and 2+ on the left side.      Bicep reflexes are 2+ on the  right side and 2+ on the left side.      Brachioradialis reflexes are 2+ on the right side and 2+ on the left side.      Patellar reflexes are 1+ on the right side and 1+ on the left side.      Achilles reflexes are 1+ on the right side and 1+ on the left side.      Strength is 5 of 5 in his deltoids, biceps, triceps, wrist extension, wrist flexion, and intrinsics. Lower ectomy strength is also 5 out of 5     Assessment/Plan 30 years and presents for an ACDF at C3-4.  Tyerra Loretto P 11/13/2011, 12:10 PM

## 2011-11-13 NOTE — Op Note (Signed)
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  EBL: Minimal  History of present illness: Patient is a very pleasant 41 year old gentleman has had progressive worsening neck and prominent pain he would radiate into the webspace of the left side his neck he failed all forms of conservative treatment with physical therapy exercises anti-inflammatories and steroids MRI scan showed cervical stenosis with a severe spur extending in the left C4 foramen in his neck pain and pain in the web spaces To be due to this as it is today conservative treatment imaging findings and progression conical syndrome was recommended anterior cervical discectomy and fusion 1 over the risks and benefits of the operation with her as well as perioperative course and expectations of outcome and alternatives of surgery he understands and agrees to proceed forward.  Operative procedure: Patient was brought into the or was induced under general anesthesia positioned supine the neck in slight extension in 5 pounds of halter traction the right side was next prepped and draped in routine sterile fashion preoperative localizing appropriate level so a curvilinear incision was made just off midline to the interbody the sternomastoid and the superficial layer of the platysma was dissected and divided longitudinally the avascular plane to sternomastoid and strap as was developed and the previously fashioned professes acid with Kitners. Initial postoperative x-ray identify the C2-3 disc space suggesting when the space below this at C3-4 this was incised with 15 blade scalpel marked the disc space and lungs close  was reflected laterally and self-retaining retractors placed. The disc space was further incised large anterior aspect of did not have him a Kerrison punch disc space is then drilled down capturing the bone shavings in a mucous trap at this point he optimizer drape brought in the field and the microscope illumination the space was further drilled down the posterior annulus facet complex. Then using a 1 Kerrison punch aggressive and viable template is carried out the PLL was identified and removed in piecemeal fashion exposing the thecal sac. There was a large osteophyte coming off the posterior aspect of the C3 vertebral body extending towards the left side of the spinal canal and left C4 foramen there is also some soft disc material that was easily extracted to the nerve hook I. the C4 foramen excess amount of epidural fibrosis was also overlying the C4 foramen. After all this was removed the C4 nerve root was a decompressed marking laterally the right C4 nerve was also identified around and the right C4 nerve root was decompressed flush thoracic pedicle. At the discectomy after aggressive abutting both endplates central canal and foramina were all widely patent. Then the endplates were scraped and copiously irrigated the space this was maintained a 9 mm peek cages packed with local autograft mixed DBX and inserted a 12 mm globus extend plate was placed all screws excellent purchase locking mechanism was engaged wounds and copiously irrigated posterior fluoroscopy confirmed good position of plate screws and bone grafts and the wounds closed in layers with after Vicryl and platysma and a running 4 subcuticular benzoin Steri-Strips were applied patient recovered in stable condition at the case on it counts sponge counts were correct.

## 2011-11-13 NOTE — Preoperative (Signed)
Beta Blockers   Reason not to administer Beta Blockers:Not Applicable 

## 2011-11-13 NOTE — Transfer of Care (Signed)
Immediate Anesthesia Transfer of Care Note  Patient: Michael Schmitt  Procedure(s) Performed: Procedure(s) (LRB): ANTERIOR CERVICAL DECOMPRESSION/DISCECTOMY FUSION 1 LEVEL (N/A)  Patient Location: PACU  Anesthesia Type: General  Level of Consciousness: awake, alert , oriented and patient cooperative  Airway & Oxygen Therapy: Patient Spontanous Breathing and Patient connected to nasal cannula oxygen  Post-op Assessment: Report given to PACU RN, Post -op Vital signs reviewed and stable and Patient moving all extremities X 4  Post vital signs: Reviewed and stable  Complications: No apparent anesthesia complications

## 2011-11-13 NOTE — Anesthesia Preprocedure Evaluation (Addendum)
Anesthesia Evaluation  Patient identified by MRN, date of birth, ID band Patient awake    Reviewed: Allergy & Precautions, H&P , NPO status , Patient's Chart, lab work & pertinent test results, reviewed documented beta blocker date and time   History of Anesthesia Complications Negative for: history of anesthetic complications  Airway Mallampati: II TM Distance: >3 FB Neck ROM: Limited    Dental No notable dental hx. (+) Teeth Intact and Dental Advisory Given   Pulmonary asthma (no inhaler in a year) , Current Smoker,  breath sounds clear to auscultation  Pulmonary exam normal       Cardiovascular hypertension, Pt. on medications and Pt. on home beta blockers Rhythm:Regular Rate:Normal     Neuro/Psych  Headaches, PSYCHIATRIC DISORDERS Anxiety    GI/Hepatic Neg liver ROS, GERD-  Controlled and Medicated,  Endo/Other  negative endocrine ROS  Renal/GU Renal diseasenegative Renal ROS     Musculoskeletal   Abdominal   Peds  Hematology   Anesthesia Other Findings   Reproductive/Obstetrics                          Anesthesia Physical Anesthesia Plan  ASA: II  Anesthesia Plan: General   Post-op Pain Management:    Induction: Intravenous  Airway Management Planned: Oral ETT and Video Laryngoscope Planned  Additional Equipment:   Intra-op Plan:   Post-operative Plan: Extubation in OR  Informed Consent: I have reviewed the patients History and Physical, chart, labs and discussed the procedure including the risks, benefits and alternatives for the proposed anesthesia with the patient or authorized representative who has indicated his/her understanding and acceptance.   Dental advisory given  Plan Discussed with: CRNA, Surgeon and Anesthesiologist  Anesthesia Plan Comments: (Plan routine monitors, GETA with VideoGlide intubation  )       Anesthesia Quick Evaluation

## 2011-11-13 NOTE — Anesthesia Procedure Notes (Signed)
Procedure Name: Intubation Date/Time: 11/13/2011 12:56 PM Performed by: Carney Living Pre-anesthesia Checklist: Patient identified, Emergency Drugs available, Suction available, Patient being monitored and Timeout performed Patient Re-evaluated:Patient Re-evaluated prior to inductionOxygen Delivery Method: Circle system utilized Preoxygenation: Pre-oxygenation with 100% oxygen Ventilation: Mask ventilation without difficulty and Oral airway inserted - appropriate to patient size Tube type: Oral Tube size: 7.5 mm Number of attempts: 1 Airway Equipment and Method: Video-laryngoscopy and Stylet Placement Confirmation: ETT inserted through vocal cords under direct vision,  positive ETCO2 and breath sounds checked- equal and bilateral Secured at: 21 cm Tube secured with: Tape Dental Injury: Teeth and Oropharynx as per pre-operative assessment

## 2011-11-14 ENCOUNTER — Encounter (HOSPITAL_COMMUNITY): Payer: Self-pay | Admitting: Neurosurgery

## 2011-11-14 MED ORDER — OXYCODONE-ACETAMINOPHEN 5-325 MG PO TABS: 1.0000 | ORAL_TABLET | ORAL | Status: AC | PRN

## 2011-11-14 MED ORDER — CYCLOBENZAPRINE HCL 10 MG PO TABS: 10.0000 mg | ORAL_TABLET | Freq: Three times a day (TID) | ORAL | Status: AC | PRN

## 2011-11-14 NOTE — Discharge Summary (Signed)
  Physician Discharge Summary  Patient ID: Michael Schmitt MRN: [id number] DOB/AGE: Jul 28, 1970 41 y.o.  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 *    Discharged Condition: good  Hospital Course: Patient was admitted hospital underwent the aforementioned procedure of an ACDF at C3-4 postoperatively patient did very well with recovered in the floor on the floor patient convalesced well was angling and voiding spontaneously tolerating regular diet to soft foods and was he'll be discharged on postop day 1.  Consults: Significant Diagnostic Studies: Treatments: ACDF C3-4 Discharge Exam: Blood pressure 129/77, pulse 83, temperature 98.2 F (36.8 C), temperature source Oral, resp. rate 16, SpO2 93.00%. Strength out of 5 wound clean and dry  Disposition: Home   Medication List  As of 11/14/2011  7:44 AM   TAKE these medications         aspirin 325 MG tablet   Take 650 mg by mouth every 4 (four) hours as needed. Remarks that its BC powder that he used 3-4 times per day, until 11/06/2011      atenolol 25 MG tablet   Commonly known as: TENORMIN   Take 25 mg by mouth daily before breakfast.      cetirizine 10 MG tablet   Commonly known as: ZYRTEC   Take 10 mg by mouth daily.      clonazePAM 1 MG tablet   Commonly known as: KLONOPIN   Take 1 mg by mouth 3 (three) times daily as needed. For anxiety      cyclobenzaprine 10 MG tablet   Commonly known as: FLEXERIL   Take 1 tablet (10 mg total) by mouth 3 (three) times daily as needed for muscle spasms.      escitalopram 20 MG tablet   Commonly known as: LEXAPRO   Take 20 mg by mouth daily.      HYDROcodone-acetaminophen 5-325 MG per tablet   Commonly known as: NORCO/VICODIN   Take 1-2 tablets by mouth every 4 (four) hours as needed.      mirtazapine 15 MG tablet   Commonly known as:  REMERON   Take 22.5-30 mg by mouth at bedtime. 1.5 tablets - 2 tablets for sleep. If 1.5 tablets doesn't make him fall asleep he will take the remaining 0.5 tablet      omeprazole 40 MG capsule   Commonly known as: PRILOSEC   Take 40 mg by mouth daily before breakfast.      oxyCODONE-acetaminophen 5-325 MG per tablet   Commonly known as: PERCOCET/ROXICET   Take 1-2 tablets by mouth every 4 (four) hours as needed.             Signed: Mackenze Grandison P 11/14/2011, 7:44 AM

## 2011-11-14 NOTE — Progress Notes (Signed)
Subjective: Patient reports Significant proven his arm pain swelling is slightly better  Objective: Vital signs in last 24 hours: Temp:  [96.8 F (36 C)-98.6 F (37 C)] 98.2 F (36.8 C) (08/20 0348) Pulse Rate:  [69-89] 83  (08/20 0348) Resp:  [11-27] 16  (08/20 0348) BP: (129-156)/(77-97) 129/77 mmHg (08/20 0348) SpO2:  [93 %-100 %] 93 % (08/20 0348)  Intake/Output from previous day: 08/19 0701 - 08/20 0700 In: 2440 [P.O.:840; I.V.:1600] Out: -  Intake/Output this shift:    Strength out of 5 wound clean and dry  Lab Results: No results found for this basename: WBC:2,HGB:2,HCT:2,PLT:2 in the last 72 hours BMET No results found for this basename: NA:2,K:2,CL:2,CO2:2,GLUCOSE:2,BUN:2,CREATININE:2,CALCIUM:2 in the last 72 hours  Studies/Results: Dg Cervical Spine 1 View  11/13/2011  *RADIOLOGY REPORT*  Clinical Data: Neck pain  DG C-ARM 1-60 MIN,DG CERVICAL SPINE - 1 VIEW  Comparison: None.  Findings: C-arm films document C3-4 ACDF.  No adverse features.  IMPRESSION: As above.   Original Report Authenticated By: Staci Righter, M.D.    Dg C-arm 1-60 Min  11/13/2011  *RADIOLOGY REPORT*  Clinical Data: Neck pain  DG C-ARM 1-60 MIN,DG CERVICAL SPINE - 1 VIEW  Comparison: None.  Findings: C-arm films document C3-4 ACDF.  No adverse features.  IMPRESSION: As above.   Original Report Authenticated By: Staci Righter, M.D.     Assessment/Plan: Discharged home  LOS: 1 day     Michael Schmitt P 11/14/2011, 7:41 AM

## 2011-11-16 ENCOUNTER — Encounter (HOSPITAL_COMMUNITY): Payer: Self-pay

## 2011-12-14 ENCOUNTER — Emergency Department: Payer: Self-pay | Admitting: Emergency Medicine

## 2011-12-14 LAB — COMPREHENSIVE METABOLIC PANEL
Anion Gap: 6 — ABNORMAL LOW (ref 7–16)
BUN: 10 mg/dL (ref 7–18)
Calcium, Total: 9.1 mg/dL (ref 8.5–10.1)
Co2: 27 mmol/L (ref 21–32)
Creatinine: 1.17 mg/dL (ref 0.60–1.30)
EGFR (Non-African Amer.): >60
Osmolality: 278 (ref 275–301)
Potassium: 4 mmol/L (ref 3.5–5.1)
Sodium: 140 mmol/L (ref 136–145)

## 2011-12-14 LAB — CBC
Platelet: 188 10*3/uL (ref 150–440)
RBC: 5.3 10*6/uL (ref 4.40–5.90)
WBC: 8.2 10*3/uL (ref 3.8–10.6)

## 2011-12-15 ENCOUNTER — Emergency Department: Payer: Self-pay | Admitting: Emergency Medicine

## 2011-12-15 LAB — CBC
HGB: 16.6 g/dL (ref 13.0–18.0)
MCH: 31.1 pg (ref 26.0–34.0)
MCV: 91 fL (ref 80–100)
RBC: 5.35 10*6/uL (ref 4.40–5.90)

## 2011-12-15 LAB — BASIC METABOLIC PANEL
Anion Gap: 7 (ref 7–16)
BUN: 11 mg/dL (ref 7–18)
Chloride: 103 mmol/L (ref 98–107)
Co2: 28 mmol/L (ref 21–32)
EGFR (African American): >60
Osmolality: 275 (ref 275–301)

## 2012-01-10 ENCOUNTER — Emergency Department: Payer: Self-pay

## 2012-02-20 ENCOUNTER — Encounter: Payer: Self-pay | Admitting: Neurosurgery

## 2012-02-23 ENCOUNTER — Emergency Department: Payer: Self-pay | Admitting: Emergency Medicine

## 2012-02-25 ENCOUNTER — Encounter: Payer: Self-pay | Admitting: Neurosurgery

## 2012-03-12 ENCOUNTER — Ambulatory Visit: Payer: Self-pay | Admitting: Pain Medicine

## 2012-03-22 ENCOUNTER — Emergency Department: Payer: Self-pay | Admitting: Emergency Medicine

## 2012-03-25 ENCOUNTER — Emergency Department: Payer: Self-pay | Admitting: Emergency Medicine

## 2012-03-25 LAB — CBC
HCT: 50.1 % (ref 40.0–52.0)
MCHC: 33.2 g/dL (ref 32.0–36.0)
MCV: 93 fL (ref 80–100)
Platelet: 218 10*3/uL (ref 150–440)
RBC: 5.41 10*6/uL (ref 4.40–5.90)
RDW: 15 % — ABNORMAL HIGH (ref 11.5–14.5)
WBC: 8.3 10*3/uL (ref 3.8–10.6)

## 2012-03-25 LAB — URINALYSIS, COMPLETE
Bacteria: NONE SEEN
Bilirubin,UR: NEGATIVE
Glucose,UR: NEGATIVE mg/dL (ref 0–75)
Ketone: NEGATIVE
Ph: 5 (ref 4.5–8.0)
RBC,UR: <1 /HPF (ref 0–5)
Squamous Epithelial: 1
WBC UR: 1 /HPF (ref 0–5)

## 2012-03-25 LAB — DRUG SCREEN, URINE
Amphetamines, Ur Screen: NEGATIVE (ref ?–1000)
Barbiturates, Ur Screen: NEGATIVE (ref ?–200)
Cocaine Metabolite,Ur ~~LOC~~: NEGATIVE (ref ?–300)
MDMA (Ecstasy)Ur Screen: NEGATIVE (ref ?–500)

## 2012-03-25 LAB — COMPREHENSIVE METABOLIC PANEL
Bilirubin,Total: 0.1 mg/dL — ABNORMAL LOW (ref 0.2–1.0)
Calcium, Total: 8.9 mg/dL (ref 8.5–10.1)
Co2: 27 mmol/L (ref 21–32)
EGFR (African American): >60
EGFR (Non-African Amer.): >60
Glucose: 118 mg/dL — ABNORMAL HIGH (ref 65–99)
Osmolality: 277 (ref 275–301)
Potassium: 4.2 mmol/L (ref 3.5–5.1)
SGOT(AST): 26 U/L (ref 15–37)

## 2012-03-25 LAB — ETHANOL
Ethanol %: <0.003 % (ref 0.000–0.080)
Ethanol: <3 mg/dL

## 2012-07-05 ENCOUNTER — Emergency Department: Payer: Self-pay | Admitting: Emergency Medicine

## 2012-07-05 LAB — URINALYSIS, COMPLETE
Bacteria: NONE SEEN
Ph: 6 (ref 4.5–8.0)
Protein: NEGATIVE
RBC,UR: 1 /HPF (ref 0–5)
Specific Gravity: 1.019 (ref 1.003–1.030)
WBC UR: <1 /HPF (ref 0–5)

## 2012-07-05 LAB — CBC
HGB: 16.7 g/dL (ref 13.0–18.0)
MCV: 93 fL (ref 80–100)

## 2012-07-05 LAB — COMPREHENSIVE METABOLIC PANEL
Albumin: 3.8 g/dL (ref 3.4–5.0)
Alkaline Phosphatase: 112 U/L (ref 50–136)
BUN: 13 mg/dL (ref 7–18)
Calcium, Total: 9.1 mg/dL (ref 8.5–10.1)
Glucose: 89 mg/dL (ref 65–99)
SGOT(AST): 52 U/L — ABNORMAL HIGH (ref 15–37)

## 2012-07-12 ENCOUNTER — Ambulatory Visit: Payer: Self-pay

## 2012-08-14 ENCOUNTER — Emergency Department: Payer: Self-pay | Admitting: Emergency Medicine

## 2012-08-14 LAB — DRUG SCREEN, URINE
Barbiturates, Ur Screen: NEGATIVE (ref ?–200)
Cannabinoid 50 Ng, Ur ~~LOC~~: NEGATIVE (ref ?–50)
MDMA (Ecstasy)Ur Screen: NEGATIVE (ref ?–500)
Opiate, Ur Screen: NEGATIVE (ref ?–300)

## 2012-08-14 LAB — COMPREHENSIVE METABOLIC PANEL
Albumin: 3.7 g/dL (ref 3.4–5.0)
BUN: 7 mg/dL (ref 7–18)
Calcium, Total: 9.1 mg/dL (ref 8.5–10.1)
Creatinine: 0.98 mg/dL (ref 0.60–1.30)
EGFR (African American): >60
EGFR (Non-African Amer.): >60
Glucose: 106 mg/dL — ABNORMAL HIGH (ref 65–99)
Osmolality: 268 (ref 275–301)

## 2012-08-14 LAB — URINALYSIS, COMPLETE
Blood: NEGATIVE
Hyaline Cast: 1
Nitrite: NEGATIVE
Protein: NEGATIVE
Specific Gravity: 1.015 (ref 1.003–1.030)
WBC UR: 1 /HPF (ref 0–5)

## 2012-08-14 LAB — CBC
HCT: 48.2 % (ref 40.0–52.0)
MCH: 31.7 pg (ref 26.0–34.0)
MCV: 91 fL (ref 80–100)
RBC: 5.3 10*6/uL (ref 4.40–5.90)

## 2012-08-14 LAB — ETHANOL: Ethanol: <3 mg/dL

## 2012-12-02 ENCOUNTER — Emergency Department: Payer: Self-pay | Admitting: Emergency Medicine

## 2012-12-02 LAB — COMPREHENSIVE METABOLIC PANEL
Albumin: 4.1 g/dL (ref 3.4–5.0)
BUN: 12 mg/dL (ref 7–18)
Calcium, Total: 9.3 mg/dL (ref 8.5–10.1)
Co2: 26 mmol/L (ref 21–32)
Creatinine: 1.16 mg/dL (ref 0.60–1.30)
EGFR (African American): >60
Potassium: 4.2 mmol/L (ref 3.5–5.1)
SGOT(AST): 34 U/L (ref 15–37)
SGPT (ALT): 52 U/L (ref 12–78)
Sodium: 138 mmol/L (ref 136–145)
Total Protein: 7.7 g/dL (ref 6.4–8.2)

## 2012-12-02 LAB — URINALYSIS, COMPLETE
Bacteria: NONE SEEN
Nitrite: NEGATIVE
Ph: 5 (ref 4.5–8.0)
RBC,UR: <1 /HPF (ref 0–5)

## 2012-12-02 LAB — CBC
MCH: 32 pg (ref 26.0–34.0)
MCV: 93 fL (ref 80–100)
Platelet: 195 10*3/uL (ref 150–440)
RBC: 5.38 10*6/uL (ref 4.40–5.90)

## 2013-02-12 DIAGNOSIS — J342 Deviated nasal septum: Secondary | ICD-10-CM | POA: Insufficient documentation

## 2013-03-31 ENCOUNTER — Emergency Department: Payer: Self-pay | Admitting: Emergency Medicine

## 2013-03-31 LAB — COMPREHENSIVE METABOLIC PANEL
ALK PHOS: 102 U/L
AST: 29 U/L (ref 15–37)
Albumin: 3.6 g/dL (ref 3.4–5.0)
Anion Gap: 3 — ABNORMAL LOW (ref 7–16)
BILIRUBIN TOTAL: 0.3 mg/dL (ref 0.2–1.0)
BUN: 15 mg/dL (ref 7–18)
CALCIUM: 9.2 mg/dL (ref 8.5–10.1)
CO2: 29 mmol/L (ref 21–32)
Chloride: 108 mmol/L — ABNORMAL HIGH (ref 98–107)
Creatinine: 0.96 mg/dL (ref 0.60–1.30)
GLUCOSE: 83 mg/dL (ref 65–99)
OSMOLALITY: 279 (ref 275–301)
Potassium: 4.5 mmol/L (ref 3.5–5.1)
SGPT (ALT): 54 U/L (ref 12–78)
SODIUM: 140 mmol/L (ref 136–145)
TOTAL PROTEIN: 7 g/dL (ref 6.4–8.2)

## 2013-03-31 LAB — CBC
HCT: 45.8 % (ref 40.0–52.0)
HGB: 15.7 g/dL (ref 13.0–18.0)
MCH: 31.3 pg (ref 26.0–34.0)
MCHC: 34.3 g/dL (ref 32.0–36.0)
MCV: 91 fL (ref 80–100)
PLATELETS: 204 10*3/uL (ref 150–440)
RBC: 5.02 10*6/uL (ref 4.40–5.90)
RDW: 13.4 % (ref 11.5–14.5)
WBC: 7.7 10*3/uL (ref 3.8–10.6)

## 2013-04-15 DIAGNOSIS — F332 Major depressive disorder, recurrent severe without psychotic features: Secondary | ICD-10-CM | POA: Insufficient documentation

## 2013-04-17 ENCOUNTER — Emergency Department: Payer: Self-pay | Admitting: Emergency Medicine

## 2013-04-17 LAB — BASIC METABOLIC PANEL
Anion Gap: 6 — ABNORMAL LOW (ref 7–16)
BUN: 10 mg/dL (ref 7–18)
CHLORIDE: 103 mmol/L (ref 98–107)
CO2: 26 mmol/L (ref 21–32)
Calcium, Total: 9.3 mg/dL (ref 8.5–10.1)
Creatinine: 1.1 mg/dL (ref 0.60–1.30)
EGFR (Non-African Amer.): >60
Glucose: 151 mg/dL — ABNORMAL HIGH (ref 65–99)
Osmolality: 272 (ref 275–301)
POTASSIUM: 4.2 mmol/L (ref 3.5–5.1)
SODIUM: 135 mmol/L — AB (ref 136–145)

## 2013-04-17 LAB — PROTIME-INR
INR: 1
PROTHROMBIN TIME: 12.6 s (ref 11.5–14.7)

## 2013-04-17 LAB — CBC WITH DIFFERENTIAL/PLATELET
Basophil #: 0.1 10*3/uL (ref 0.0–0.1)
Basophil %: 0.3 %
EOS PCT: 0 %
Eosinophil #: 0 10*3/uL (ref 0.0–0.7)
HCT: 45.2 % (ref 40.0–52.0)
HGB: 14.8 g/dL (ref 13.0–18.0)
LYMPHS PCT: 5.1 %
Lymphocyte #: 0.9 10*3/uL — ABNORMAL LOW (ref 1.0–3.6)
MCH: 30.4 pg (ref 26.0–34.0)
MCHC: 32.7 g/dL (ref 32.0–36.0)
MCV: 93 fL (ref 80–100)
Monocyte #: 0.5 x10 3/mm (ref 0.2–1.0)
Monocyte %: 2.5 %
NEUTROS PCT: 92.1 %
Neutrophil #: 16.8 10*3/uL — ABNORMAL HIGH (ref 1.4–6.5)
Platelet: 199 10*3/uL (ref 150–440)
RBC: 4.87 10*6/uL (ref 4.40–5.90)
RDW: 13.7 % (ref 11.5–14.5)
WBC: 18.3 10*3/uL — ABNORMAL HIGH (ref 3.8–10.6)

## 2013-04-18 ENCOUNTER — Emergency Department: Payer: Self-pay | Admitting: Emergency Medicine

## 2013-08-18 ENCOUNTER — Emergency Department: Payer: Self-pay | Admitting: Emergency Medicine

## 2013-08-18 LAB — COMPREHENSIVE METABOLIC PANEL
ALBUMIN: 3.7 g/dL (ref 3.4–5.0)
ALT: 101 U/L — AB (ref 12–78)
AST: 76 U/L — AB (ref 15–37)
Alkaline Phosphatase: 90 U/L
Anion Gap: 7 (ref 7–16)
BUN: 11 mg/dL (ref 7–18)
Bilirubin,Total: 0.3 mg/dL (ref 0.2–1.0)
CO2: 26 mmol/L (ref 21–32)
Calcium, Total: 9.7 mg/dL (ref 8.5–10.1)
Chloride: 104 mmol/L (ref 98–107)
Creatinine: 1.23 mg/dL (ref 0.60–1.30)
EGFR (Non-African Amer.): >60
GLUCOSE: 153 mg/dL — AB (ref 65–99)
Osmolality: 276 (ref 275–301)
Potassium: 4.3 mmol/L (ref 3.5–5.1)
Sodium: 137 mmol/L (ref 136–145)
Total Protein: 7.2 g/dL (ref 6.4–8.2)

## 2013-08-18 LAB — URINALYSIS, COMPLETE
BILIRUBIN, UR: NEGATIVE
Bacteria: NONE SEEN
Blood: NEGATIVE
Glucose,UR: NEGATIVE mg/dL (ref 0–75)
KETONE: NEGATIVE
LEUKOCYTE ESTERASE: NEGATIVE
Nitrite: NEGATIVE
PROTEIN: NEGATIVE
Ph: 5 (ref 4.5–8.0)
RBC,UR: <1 /HPF (ref 0–5)
Specific Gravity: 1.023 (ref 1.003–1.030)
Squamous Epithelial: 1
WBC UR: NONE SEEN /HPF (ref 0–5)

## 2013-08-18 LAB — CBC
HCT: 47.5 % (ref 40.0–52.0)
HGB: 16 g/dL (ref 13.0–18.0)
MCH: 31.3 pg (ref 26.0–34.0)
MCHC: 33.7 g/dL (ref 32.0–36.0)
MCV: 93 fL (ref 80–100)
Platelet: 218 10*3/uL (ref 150–440)
RBC: 5.12 10*6/uL (ref 4.40–5.90)
RDW: 14.4 % (ref 11.5–14.5)
WBC: 7 10*3/uL (ref 3.8–10.6)

## 2013-08-18 LAB — ETHANOL
Ethanol %: <0.003 % (ref 0.000–0.080)
Ethanol: <3 mg/dL

## 2013-08-18 LAB — LIPASE, BLOOD: LIPASE: 173 U/L (ref 73–393)

## 2013-08-18 LAB — ACETAMINOPHEN LEVEL

## 2013-09-09 ENCOUNTER — Inpatient Hospital Stay: Payer: Self-pay | Admitting: Psychiatry

## 2013-09-09 LAB — URINALYSIS, COMPLETE
BLOOD: NEGATIVE
Bacteria: NONE SEEN
Bilirubin,UR: NEGATIVE
GLUCOSE, UR: NEGATIVE mg/dL (ref 0–75)
KETONE: NEGATIVE
Leukocyte Esterase: NEGATIVE
Nitrite: NEGATIVE
PH: 5 (ref 4.5–8.0)
PROTEIN: NEGATIVE
RBC, UR: NONE SEEN /HPF (ref 0–5)
SPECIFIC GRAVITY: 1.014 (ref 1.003–1.030)
Squamous Epithelial: <1

## 2013-09-09 LAB — BEHAVIORAL MEDICINE 1 PANEL
ALBUMIN: 3.9 g/dL (ref 3.4–5.0)
ANION GAP: 4 — AB (ref 7–16)
AST: 64 U/L — AB (ref 15–37)
Alkaline Phosphatase: 83 U/L
BASOS ABS: 0.1 10*3/uL (ref 0.0–0.1)
BASOS PCT: 1.4 %
BUN: 11 mg/dL (ref 7–18)
Bilirubin,Total: 0.3 mg/dL (ref 0.2–1.0)
CALCIUM: 8.8 mg/dL (ref 8.5–10.1)
CHLORIDE: 106 mmol/L (ref 98–107)
CO2: 27 mmol/L (ref 21–32)
CREATININE: 1.07 mg/dL (ref 0.60–1.30)
EGFR (African American): >60
EGFR (Non-African Amer.): >60
Eosinophil #: 0.3 10*3/uL (ref 0.0–0.7)
Eosinophil %: 3.9 %
Glucose: 96 mg/dL (ref 65–99)
HCT: 48 % (ref 40.0–52.0)
HGB: 16 g/dL (ref 13.0–18.0)
LYMPHS ABS: 2.7 10*3/uL (ref 1.0–3.6)
Lymphocyte %: 32.8 %
MCH: 31.5 pg (ref 26.0–34.0)
MCHC: 33.4 g/dL (ref 32.0–36.0)
MCV: 94 fL (ref 80–100)
Monocyte #: 0.6 x10 3/mm (ref 0.2–1.0)
Monocyte %: 6.7 %
NEUTROS PCT: 55.2 %
Neutrophil #: 4.5 10*3/uL (ref 1.4–6.5)
OSMOLALITY: 273 (ref 275–301)
Platelet: 239 10*3/uL (ref 150–440)
Potassium: 4.3 mmol/L (ref 3.5–5.1)
RBC: 5.09 10*6/uL (ref 4.40–5.90)
RDW: 14.2 % (ref 11.5–14.5)
SGPT (ALT): 88 U/L — ABNORMAL HIGH (ref 12–78)
SODIUM: 137 mmol/L (ref 136–145)
TOTAL PROTEIN: 7.2 g/dL (ref 6.4–8.2)
Thyroid Stimulating Horm: 0.61 u[IU]/mL
WBC: 8.2 10*3/uL (ref 3.8–10.6)

## 2013-09-10 ENCOUNTER — Ambulatory Visit: Payer: Self-pay | Admitting: Psychiatry

## 2013-10-03 ENCOUNTER — Ambulatory Visit: Payer: Self-pay | Admitting: Psychiatry

## 2014-03-18 ENCOUNTER — Ambulatory Visit: Payer: Self-pay

## 2014-03-18 LAB — CBC WITH DIFFERENTIAL/PLATELET
Basophil #: 0.1 10*3/uL (ref 0.0–0.1)
Basophil %: 1.4 %
EOS ABS: 0.2 10*3/uL (ref 0.0–0.7)
EOS PCT: 3.2 %
HCT: 41.9 % (ref 40.0–52.0)
HGB: 14 g/dL (ref 13.0–18.0)
LYMPHS ABS: 2.2 10*3/uL (ref 1.0–3.6)
LYMPHS PCT: 33.7 %
MCH: 30.9 pg (ref 26.0–34.0)
MCHC: 33.4 g/dL (ref 32.0–36.0)
MCV: 93 fL (ref 80–100)
MONOS PCT: 5.3 %
Monocyte #: 0.3 x10 3/mm (ref 0.2–1.0)
NEUTROS ABS: 3.6 10*3/uL (ref 1.4–6.5)
NEUTROS PCT: 56.4 %
PLATELETS: 197 10*3/uL (ref 150–440)
RBC: 4.53 10*6/uL (ref 4.40–5.90)
RDW: 13.8 % (ref 11.5–14.5)
WBC: 6.4 10*3/uL (ref 3.8–10.6)

## 2014-03-18 LAB — COMPREHENSIVE METABOLIC PANEL
ALBUMIN: 3.4 g/dL (ref 3.4–5.0)
ALT: 75 U/L — AB
ANION GAP: 6 — AB (ref 7–16)
Alkaline Phosphatase: 104 U/L
BUN: 7 mg/dL (ref 7–18)
Bilirubin,Total: 0.2 mg/dL (ref 0.2–1.0)
CALCIUM: 8.5 mg/dL (ref 8.5–10.1)
CO2: 26 mmol/L (ref 21–32)
CREATININE: 1.02 mg/dL (ref 0.60–1.30)
Chloride: 110 mmol/L — ABNORMAL HIGH (ref 98–107)
EGFR (Non-African Amer.): >60
Glucose: 117 mg/dL — ABNORMAL HIGH (ref 65–99)
OSMOLALITY: 282 (ref 275–301)
Potassium: 4 mmol/L (ref 3.5–5.1)
SGOT(AST): 44 U/L — ABNORMAL HIGH (ref 15–37)
SODIUM: 142 mmol/L (ref 136–145)
TOTAL PROTEIN: 6.5 g/dL (ref 6.4–8.2)

## 2014-03-18 LAB — T4, FREE: Free Thyroxine: 0.73 ng/dL — ABNORMAL LOW (ref 0.76–1.46)

## 2014-03-18 LAB — TSH: THYROID STIMULATING HORM: 0.763 u[IU]/mL

## 2014-03-18 LAB — HEMOGLOBIN A1C: HEMOGLOBIN A1C: 7.5 % — AB (ref 4.2–6.3)

## 2014-05-23 ENCOUNTER — Emergency Department: Payer: Self-pay | Admitting: Emergency Medicine

## 2014-06-24 ENCOUNTER — Emergency Department
Admit: 2014-06-24 | Disposition: A | Discharge status: Home / Self Care | Payer: Self-pay | Admitting: Emergency Medicine

## 2014-06-29 ENCOUNTER — Ambulatory Visit: Payer: Medicaid Other | Admitting: Nurse Practitioner

## 2014-07-17 NOTE — Consult Note (Signed)
Brief Consult Note: Diagnosis: generallized anxiety disorder.   Patient was seen by consultant.   Consult note dictated.   Orders entered.   Discussed with Attending MD.   Comments: Psychiatry: Patient seen. Chronic depression and anxiety. Off clonazepam 2 days, he says, due to using it up too soon. Denies any SI. Has an appointment with outpt provider next tuesday. Wants script to last until then. Psychoed done. Script given for 12 x 1.0mg  clonazepam to take bid and instructions to follow up with psychiatry.  Electronic Signatures: Gonzella Lex (MD)  (Signed 21-May-14 18:15)  Authored: Brief Consult Note   Last Updated: 21-May-14 18:15 by Gonzella Lex (MD)

## 2014-07-17 NOTE — Consult Note (Signed)
PATIENT NAME:  Michael Schmitt, Michael Schmitt MR#:  [id number] DATE OF BIRTH:  1970/11/02  DATE OF CONSULTATION:  08/14/2012  CONSULTING PHYSICIAN:  Gonzella Lex, MD  IDENTIFYING INFORMATION AND REASON FOR CONSULT: A 44 year old man with a history of depression and anxiety symptoms, who presented to the Emergency Room saying that he was anxious. Consultation for appropriate management and treatment.   HISTORY OF PRESENT ILLNESS: Information obtained from the patient and from the chart. The patient tells me that he was taking clonazepam prescribed to him by an outpatient psychiatrist, but that outpatient psychiatrist had decreased his dose to twice a day rather than 3 times a day last month. As a result, the patient ran through his medicines early, and is now out of his clonazepam for the last 2 days. As a result, he is feeling anxious and dysphoric, jittery, feels like he needs to be on his medicine. Afraid he cannot cope without it. He also complains of his chronic pain, which seems to be at about his baseline. He tells me overall his mood has been mildly depressed, but not too bad. He says he wakes up at night frequently from his pain. He has been eating okay. He denies having any suicidal ideation. Says that he never thinks about killing himself because he has to live for his children. He does, however, say that he is frustrated with his chronic impairment from anxiety, and from chronic neck and back pain. He denies any psychotic symptoms. He denies that he is drinking alcohol or using any other drugs.   PAST PSYCHIATRIC HISTORY: The patient has had 2 prior psychiatric hospitalizations, the last one probably about 5 years ago here at our hospital. He has been diagnosed with depression in the past. He has been treated with multiple antidepressants, but it appears that he has had little to no response to them in recent years. He gives a history of having had one panic attack many years ago. He does not report that  he is having regular panic attacks now. He denies any history of trying to kill himself. Denies any history of violence. The patient was being prescribed clonazepam by his primary care doctor previously. The primary care doctor requested that the patient switch over to seeing a psychiatrist. His psychiatrist advised him that he would like him to cut the dose down, and switched him to 1 mg twice a day last month.   PAST MEDICAL HISTORY: The patient says he has high blood pressure. Also has a history of chronic neck and back pain. Had a "disc" in his neck, and had surgery last autumn. Subsequently the pain did not get better. He had some management by Dr. Primus Bravo, but does not seem to still be going. He is not currently getting any narcotic pain medicine.   SOCIAL HISTORY: The patient has 3 children, ages 6, 49 and 70. He is a single parent, takes care of the 3 kids. He is not currently working. He tells me that he survives off a small amount of money that he gets every month, and food stamps. He has applied for disability, but has not gotten it.   MEDICATIONS:  Atenolol 25 mg once a day, probably lisinopril from what he can remember, although it also listed in the chart as Bentyl 20 mg 4 times a day. Clonazepam variable doses. Mirtazapine is 15 mg, 2of them at night, as listed in the chart. Patient did not report that he was currently taking that.   ALLERGIES:  No known drug allergies.   REVIEW OF SYSTEMS:  He complains of chronic neck and back pain. Chronic general anxiety. Feels nervous. Feels dysphoric. Mildly hopeless. Denies hallucinations. Denies suicidal or homicidal ideation.   MENTAL STATUS EXAMINATION: Neatly groomed man, looks his stated age, cooperative with the interview. Eye contact normal. Psychomotor activity normal. Speech normal rate, tone and volume. Affect slightly blunted. Mood stated as okay. Thoughts are lucid, without loosening of associations or delusions. Denies auditory or visual  hallucinations. Denies any suicidal or homicidal ideation. Adequate judgment and insight, overall normal intelligence.   LABORATORY RESULTS: The drug screen is positive for benzodiazepines. TSH normal. Alcohol undetected. Chemistry shows a low sodium at 135. CBC normal. Urinalysis unremarkable.   ASSESSMENT: This is a 44 year old man with chronic anxiety and depression, and chronic pain. 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 By the standard of using larger amounts than were prescribed, he has been abusing it for the past month. He does not, however, have, as far as we know, a past history of other drug or alcohol abuse. The patient is not acutely suicidal or dangerous, and does not require hospitalization. At the same time, it seems probably unnecessarily harsh to immediately cut him off of clonazepam at this juncture, especially considering that we do not know that he has a chronic abuse problem. It seems reasonable to me to give him a prescription for 6 days of medicine at the lower dose of 1 mg b.i.d.   TREATMENT PLAN:  Psychoeducation and medicine education was done with the patient, and I strongly requested that he stay with the twice a day dosage. He should not suffer any ill effects with staying on this compared to the 3 mg. He needs to follow up with his appointment at [facility] on Tuesday, and he can discuss with the doctor his needs and thoughts about his medicine, both the benzodiazepine and antidepressants, and follow up with recommended treatment there. The patient is agreeable to the plan.   DIAGNOSIS, PRINCIPAL AND PRIMARY:  AXIS I:  Depression, not otherwise specified.   SECONDARY DIAGNOSES: AXIS I:  No further diagnosis.   AXIS II:  Deferred.   AXIS III:  Chronic pain, hypertension.   AXIS IV:  Moderate to severe from being a single parent out of work with 3 children.   AXIS V:  Functioning at time of evaluation  44.    ____________________________ Gonzella Lex, MD jtc:mr D: 08/14/2012 18:24:30 ET T: 08/14/2012 19:31:55 ET JOB#: [id number]  cc: Gonzella Lex, MD, <Dictator> Gonzella Lex MD ELECTRONICALLY SIGNED 08/14/2012 20:45

## 2014-07-18 NOTE — H&P (Signed)
PATIENT NAME:  Michael Schmitt, Michael Schmitt MR#:  [id number] DATE OF BIRTH:  [birth year]  DATE OF ADMISSION:  09/09/2013  IDENTIFYING INFORMATION AND CHIEF COMPLAINT: A 44 year old man admitted voluntarily with a planned admission to begin ECT.   CHIEF COMPLAINT: "I hope this works."   HISTORY OF PRESENT ILLNESS: Information obtained from the patient and the chart. I have met him before in my office. The patient reports several years of severely depressed mood. It has been getting worse recently. His mood feels sad and down all of the time. He feels hopeless about his life. He has no interest in doing anything and his activities that he used to find enjoyable are of no interest to him anymore. He feels tired most of the time during the day. His sleep is frequently interrupted by pain. Appetite is normal. He denies any psychotic symptoms. He has passive suicidal thoughts. He is currently being treated by his outpatient psychiatrist, who has tried him on several medications without clear-cut lasting benefit. He has been referred for ECT treatment.   PAST PSYCHIATRIC HISTORY: No previous hospitalizations. Denies suicide attempts, denies violent behavior. Has had chronic passive suicidal ideation. He is currently taking mirtazapine at a high dose, which has been and no benefit. He has been on several other antidepressants and mood stabilizers in the past without benefit.   SOCIAL HISTORY: The patient has 3 children. The oldest one is now a young adult but the other 2 are adolescents and all live with him at home. The patient has been unable to work for years because of chronic neck and back pain. He has applied for disability but it has not been awarded yet. He is divorced. Closest family member is his sister who lives next-door.   FAMILY HISTORY: Positive for depression.   PAST MEDICAL HISTORY: Neck and back pain. Diagnosed with a bulging disk in his neck. Has had surgery on his neck without benefit. Takes chronic  narcotics for it. Also gastric reflux disease and a history of ulcers for which he takes medication.   CURRENT MEDICATIONS: Atenolol 25 mg per day, clonazepam 1 mg 3 times a day, lisinopril 5 mg per day, mirtazapine 60 mg at night, pantoprazole 40 mg twice a day, oxycodone 10 mg 4 of them a day, sucralfate 1 gram orally with each meal and at bedtime, Zantac 150 mg twice a day.   ALLERGIES: PREDNISONE.   REVIEW OF SYSTEMS: Depressed mood, hopelessness, fatigue, lack of interest in activities. Chronic severe neck pain and back pain. No pulmonary complaints. No cough, no wheezes. No cardiac complaints. No chest pain, no palpitations. No GI complaints.   MENTAL STATUS EXAMINATION: Neatly-dressed and groomed gentleman who looks his stated age or older, cooperative with the interview. Eye contact good. Psychomotor activity a little subdued. Speech normal rate, tone and volume. Affect is dysphoric and down but not tearful. Mood is stated as depressed. Thoughts are lucid. No evidence of loosening of associations or delusions. Denies auditory or visual hallucinations. Denies acute suicidal intent or plan, but has chronic hopelessness. No homicidal ideation. Short and long-term memory intact. Alert and oriented x 4. Normal intelligence and fund of knowledge.   PHYSICAL EXAMINATION: GENERAL: The patient appears to move stiffly, probably has some chronic discomfort in his neck.  SKIN:  No acute skin lesions identified.  HEENT: Pupils equal and reactive. Face symmetric. Oral mucosa normal.  NECK AND BACK: Tender to palpation.  NEUROLOGICAL EXAMINATION:  Full range of motion at extremities. Gait within normal  limits. Reflexes and strength symmetric throughout. Cranial nerves symmetric and normal.  LUNGS: Clear, no wheezes.  HEART: Regular rate and rhythm.  ABDOMEN: Soft, nontender, normal bowel sounds.  CURRENT VITAL SIGNS: Show a temperature of 98.2, pulse 72, respirations 18, blood pressure 139/94.    LABORATORY, DIAGNOSTIC AND RADIOLOGICAL DATA:  EKG shows sinus bradycardia but is otherwise normal. Chest x-ray unremarkable. Chemistry panel: Slightly elevated ALT at 88, elevated AST at 64, otherwise normal. CBC normal. Urinalysis is not back yet.   ASSESSMENT: A 44 year old man with major depression, severe, has not responded to multiple medications. Has severe effects on his ability to take care of himself and his family. Passive suicidal ideation. Based on failure to respond to adequate treatment, he is a good candidate for electroconvulsive therapy.    TREATMENT PLAN: Admit to psychiatry for ECT initiation. Laboratory tests ordered. He has already met with the ECT service and signed consent. Orders done. The patient was educated about ECT and given ample opportunity to ask questions. He is agreeable to the treatment. We anticipate treatment tomorrow morning and likely discharge after that.   DIAGNOSIS, PRINCIPAL AND PRIMARY:  AXIS I: Major depression, severe, single.   SECONDARY DIAGNOSES: AXIS I: No further.  AXIS II: No diagnosis.  AXIS III: Chronic pain, history of ulcers.  AXIS IV: Severe from financial difficulties.  AXIS V: Functioning at time of evaluation 37.   ____________________________ Gonzella Lex, MD jtc:cs D: 09/09/2013 18:39:15 ET T: 09/09/2013 18:53:49 ET JOB#: [id number]  cc: Gonzella Lex, MD, <Dictator> Gonzella Lex MD ELECTRONICALLY SIGNED 09/10/2013 13:24

## 2014-07-18 NOTE — Discharge Summary (Signed)
PATIENT NAME:  Michael Schmitt, Michael Schmitt MR#:  [id number] DATE OF BIRTH:  [birth year]  DATE OF ADMISSION:  09/09/2013 DATE OF DISCHARGE:  09/10/2013  HOSPITAL COURSE: See dictated history and physical for details of admission. A 44 year old male with a history of severe major depression that has not responded to medication treatment. Also a long history of chronic pain, major social stresses. Admitted to the hospital to begin ECT. The patient did not engage in any dangerous or aggressive behavior in the hospital. Did not show any suicidal actions. He was cooperative with treatment. Full lab studies were all ordered in preparation for ECT and there were no significant findings to contraindicate treatment. He was seen by anesthesia on the morning of treatment and had 1 right unilateral ECT treatment this morning. Afterwards, he had a significant headache and was given his usual pain medication. He is alert and oriented and awake but continues to have some head pain. His blood pressure went up transiently during the procedure, which was managed with medication and has come down again to a reasonable range afterwards. At this point, the patient is not reporting acute suicidal ideation and is agreeable to followup ECT. He will be discharged home today with the intention that he will follow up with Dr. Kasandra Knudsen and that he will come back for ECT here on Friday. I have requested that he not take his clonazepam the night before the treatment, and also not take the clonazepam the morning of treatments.   MENTAL STATUS EXAM AT DISCHARGE: Neatly dressed and groomed man who looks his stated age, cooperative with the interview. Good eye contact. Sluggish psychomotor activity. Slow speech. Blunted affect. Mood stated as being in pain. Thoughts are lucid but slow. No loosening of associations or delusions. Denies auditory or visual hallucinations. Denies suicidal or homicidal ideation. Judgment and insight adequate. Short and long-term  memory intact. Intelligence normal.   DISCHARGE MEDICATIONS: Oxycodone 10 mg every 6 hours for pain, lisinopril 5 mg once a day for blood pressure, mirtazapine 30 mg 2 tablets at night, temazepam 1 mg 3 times a day, atenolol 25 mg once a day, Zantac 150 mg twice a day, sucralfate 1 gram with each meal and at bedtime, and esomeprazole, that is Nexium 20 mg once a day.   LABORATORY, DIAGNOSTIC AND RADIOLOGICAL DATA:  Urinalysis all normal. Chemistry panel: Slightly elevated AST, otherwise unremarkable. CBC all normal. EKG sinus bradycardia, otherwise normal. The chest x-ray was normal.   DISPOSITION: Discharge home. Follow up to see me for ECT on Friday. As mentioned above, I have requested that he cut back on his clonazepam prior to treatment.   DIAGNOSIS, PRINCIPAL AND PRIMARY:  AXIS I: Major depression, severe, single episode.   SECONDARY DIAGNOSES: AXIS I: No further.  AXIS II: Deferred.  AXIS III: Chronic pain, gastric reflux, history of ulcers.  AXIS IV: Severe from financial difficulties.  AXIS V: Functioning at time of discharge is 52.    ____________________________ Gonzella Lex, MD jtc:cs D: 09/10/2013 13:36:00 ET T: 09/10/2013 14:05:33 ET JOB#: [id number]  cc: Gonzella Lex, MD, <Dictator> Gonzella Lex MD ELECTRONICALLY SIGNED 09/16/2013 17:10

## 2014-07-24 ENCOUNTER — Ambulatory Visit (INDEPENDENT_AMBULATORY_CARE_PROVIDER_SITE_OTHER): Payer: Medicaid Other | Admitting: Neurology

## 2014-07-24 ENCOUNTER — Encounter: Payer: Self-pay | Admitting: Neurology

## 2014-07-24 VITALS — BP 139/87 | HR 78 | Ht 74.0 in | Wt 225.8 lb

## 2014-07-24 DIAGNOSIS — M47812 Spondylosis without myelopathy or radiculopathy, cervical region: Secondary | ICD-10-CM | POA: Diagnosis not present

## 2014-07-24 DIAGNOSIS — R51 Headache: Secondary | ICD-10-CM | POA: Diagnosis not present

## 2014-07-24 DIAGNOSIS — G4486 Cervicogenic headache: Secondary | ICD-10-CM

## 2014-07-24 HISTORY — DX: Cervicogenic headache: G44.86

## 2014-07-24 HISTORY — DX: Spondylosis without myelopathy or radiculopathy, cervical region: M47.812

## 2014-07-24 MED ORDER — PREGABALIN 25 MG PO CAPS: ORAL_CAPSULE | ORAL | Status: DC

## 2014-07-24 NOTE — Patient Instructions (Signed)

## 2014-07-24 NOTE — Progress Notes (Signed)
Reason for visit: Cervicogenic headache  Referring physician: Dr. Luna Fuse is a 44 y.o. male  History of present illness:  Michael Schmitt is a 44 year old right-handed white male with a history of cervical spine disease, requiring cervical spine surgery in 2013. Michael Schmitt indicates that his pain level increased after surgery, and he is having significant neck and left shoulder discomfort, and some discomfort down Michael left arm to Michael forearm. He denies any numbness or weakness of Michael extremities, but he also has low back pain that is chronic in nature. He denies balance issues or difficulty controlling Michael bowels or Michael bladder. He indicates that he has not had a repeat MRI of Michael cervical spine since surgery. He has been seen through a pain center, and epidural steroid injections have not been beneficial. He claims that he has had EMG and nerve conduction study evaluation of Michael arm on Michael left, but Michael results of this is not available to me. Michael Schmitt describes Michael pain is a sharp discomfort that will go up Michael neck into Michael head, and forward into Michael frontal areas. Michael Schmitt has daily pain. Michael Schmitt has neck stiffness, crepitus in Michael neck. He is sent to this office for further evaluation. Michael Schmitt has a history of significant depression, requiring hospitalization and ECT therapy previously.  Past Medical History  Diagnosis Date  . Hypertension   . Mental disorder   . Anxiety   . Shortness of breath   . RLS (restless legs syndrome)     detected on sleep study  . GERD (gastroesophageal reflux disease)   . Headache(784.0)   . Blood dyscrasia     told that when he was young he was a" free bleeder"  . Arthritis     cerv. stenosis, spondylosis, HNP- lower back , has been followed in pain clinic, has  had injection s in cerv. area  . Chronic kidney disease     renal calculi- passed spontaneously  . Anxiety   . Depression   . Diabetes mellitus without complication    . Cervicogenic headache 07/24/2014  . Cervical spondylosis without myelopathy 07/24/2014    Past Surgical History  Procedure Laterality Date  . Nasal sinus surgery      2005  . Anterior cervical decomp/discectomy fusion  11/13/2011    Procedure: ANTERIOR CERVICAL DECOMPRESSION/DISCECTOMY FUSION 1 LEVEL;  Surgeon: [name], MD;  Location: [facility];  Service: Neurosurgery;  Laterality: N/A;  Anterior Cervical Decompression/discectomy Fusion. Cervical three-four.    Family History  Problem Relation Age of Onset  . Prostate cancer Father   . Hypertension Mother   . Kidney Stones Mother   . COPD Sister   . Hypertension Sister   . Diabetes Sister     Social history:  reports that he has been smoking.  He does not have any smokeless tobacco history on file. He reports that he drinks alcohol. He reports that he does not use illicit drugs.  Medications:  Prior to Admission medications   Medication Sig Start Date End Date Taking? Authorizing Provider  atenolol (TENORMIN) 25 MG tablet Take 25 mg by mouth daily before breakfast.    Yes Historical Provider, MD  clonazePAM (KLONOPIN) 1 MG tablet Take 1 mg by mouth 2 (two) times daily.   Yes Historical Provider, MD  esomeprazole (NEXIUM) 40 MG capsule Take 40 mg by mouth daily at 12 noon.   Yes Historical Provider, MD  glimepiride (AMARYL) 1  MG tablet Take 1 mg by mouth daily with breakfast.   Yes Historical Provider, MD  lisinopril (PRINIVIL,ZESTRIL) 5 MG tablet Take 5 mg by mouth daily.   Yes Historical Provider, MD  mirtazapine (REMERON) 30 MG tablet Take 60 mg by mouth at bedtime.   Yes Historical Provider, MD  QUEtiapine (SEROQUEL) 300 MG tablet Take 300 mg by mouth at bedtime.   Yes Historical Provider, MD  tamsulosin (FLOMAX) 0.4 MG CAPS capsule Take 0.4 mg by mouth daily.   Yes Historical Provider, MD  pregabalin (LYRICA) 25 MG capsule One capsule twice a day for 2 weeks, then take 2 capsules twice a day 07/24/14   Kathrynn Ducking,  MD      Allergies  Allergen Reactions  . Hydrocodone     Headache, irritable    ROS:  Out of a complete 14 system review of symptoms, Michael Schmitt complains only of Michael following symptoms, and all other reviewed systems are negative.  Weight gain Difficulty swallowing Skin rash Blurred vision, loss of vision Wheezing, snoring Feeling hot, increased thirst Joint pain, achy muscles Confusion, headache, numbness, weakness, difficulty swallowing Depression, anxiety, decreased energy, racing thoughts Snoring, restless legs  Blood pressure 139/87, pulse 78, height 6\' 2"  (1.88 m), weight 225 lb 12.8 oz (102.422 kg).  Physical Exam  General: Michael Schmitt is alert and cooperative at Michael time of Michael examination.  Eyes: Pupils are equal, round, and reactive to light. Discs are flat bilaterally.  Neck: Michael neck is supple, no carotid bruits are noted.  Respiratory: Michael respiratory examination is clear.  Cardiovascular: Michael cardiovascular examination reveals a regular rate and rhythm, no obvious murmurs or rubs are noted.  Neuromuscular: Range of movement of Michael cervical spine lacks about 20 of lateral rotation bilaterally.  Skin: Extremities are without significant edema.  Neurologic Exam  Mental status: Michael Schmitt is alert and oriented x 3 at Michael time of Michael examination. Michael Schmitt has apparent normal recent and remote memory, with an apparently normal attention span and concentration ability.  Cranial nerves: Facial symmetry is present. There is good sensation of Michael face to pinprick and soft touch bilaterally. Michael strength of Michael facial muscles and Michael muscles to head turning and shoulder shrug are normal bilaterally. Speech is well enunciated, no aphasia or dysarthria is noted. Extraocular movements are full. Visual fields are full. Michael tongue is midline, and Michael Schmitt has symmetric elevation of Michael soft palate. No obvious hearing deficits are noted.  Motor: Michael motor  testing reveals 5 over 5 strength of all 4 extremities. Good symmetric motor tone is noted throughout.  Sensory: Sensory testing is intact to pinprick, soft touch, vibration sensation, and position sense on all 4 extremities. No evidence of extinction is noted.  Coordination: Cerebellar testing reveals good finger-nose-finger and heel-to-shin bilaterally.  Gait and station: Gait is normal. Tandem gait is normal. Romberg is negative. No drift is seen.  Reflexes: Deep tendon reflexes are symmetric and normal bilaterally. Toes are downgoing bilaterally.   Assessment/Plan:  1. Cervical spondylosis  2. Cervicogenic headache  3. Chronic low back pain  Michael Schmitt is having ongoing neuromuscular pain. He has oxycodone to take if needed, we will place him on Lyrica and gradually increase Michael dose. Michael Schmitt will be set up for MRI evaluation of Michael cervical spine. If this does not show any significant structural abnormalities, Michael Schmitt may be set up for trigger point injections on Michael neck and head. He will follow-up otherwise in  3 months.  Jill Alexanders MD 07/25/2014 5:10 PM  [facility] [address] [place], [place] [address]  Phone [phone] Fax [phone]

## 2014-07-27 ENCOUNTER — Telehealth: Payer: Self-pay

## 2014-07-27 MED ORDER — GABAPENTIN 300 MG PO CAPS: ORAL_CAPSULE | ORAL | Status: DC

## 2014-07-27 NOTE — Telephone Encounter (Signed)
I called the patient. The insurance will not cover Lyrica, I'll switch him over to gabapentin capsules.

## 2014-07-27 NOTE — Telephone Encounter (Signed)
I called patient to schedule f/u appointment. He stated that he has not been able to get his Lyrica Rx filled d/t not having a prior authorization. I am forwarding this to [place].

## 2014-07-27 NOTE — Telephone Encounter (Signed)
Pharmacy sent the PA request while we were closed over the weekend.  I contacted Medicaid.  They will not cover Lyrica, as it is not preferred on the plan.  Message has been forwarded to the provider for review.   (current encounter was already closed when it was forwarded)

## 2014-07-27 NOTE — Telephone Encounter (Signed)
[employer] will not approve the request for coverage on Lyrica.  They indicate a documented trial and failure of Gabapentin is required.  They will cover Gabapentin Capsules (tablets are not covered under plan) if a drug change is desired.  Otherwise, a letter of med [place] may be written including reason Gabapentin is not appropriate for the patient.  Please advise.  Thank you.

## 2014-07-29 ENCOUNTER — Telehealth: Payer: Self-pay | Admitting: Neurology

## 2014-07-29 DIAGNOSIS — M542 Cervicalgia: Secondary | ICD-10-CM

## 2014-07-29 NOTE — Telephone Encounter (Signed)
mri cervical spine denied case [id number]  for review md may call [phone] case has been denied.

## 2014-07-30 MED ORDER — GABAPENTIN 100 MG PO CAPS: ORAL_CAPSULE | ORAL | Status: DC

## 2014-07-30 NOTE — Telephone Encounter (Signed)
I called the insurance company, they still denied the MRI. They indicate that he needs a recent cervical spine x-ray, and we need to wait 6 weeks on Lyrica therapy to determine whether the pain is still there. I will call the patient regarding this.

## 2014-08-06 ENCOUNTER — Telehealth: Payer: Self-pay | Admitting: Neurology

## 2014-08-06 MED ORDER — TRAMADOL HCL 50 MG PO TABS: 50.0000 mg | ORAL_TABLET | Freq: Four times a day (QID) | ORAL | Status: DC | PRN

## 2014-08-06 NOTE — Telephone Encounter (Signed)
I called patient. The patient makes operate the increase on the gabapentin if he is tolerating it. He may go to 200 mg 3 times a day now, if he is doing well for 5 days or so, let me know, I will go up to the 300 mg capsules. I will not call in a prescription for Percocet, I will give him a small prescription for Ultram.

## 2014-08-06 NOTE — Telephone Encounter (Signed)
Patient called and stated that he is currently taking Rx. gabapentin (NEURONTIN) 100 MG capsule and is requesting to change to Rx. PERCOCET instead. He states that he cannot complete normal daily activities because the gabapentin is offering no relief. Please call and advise.

## 2014-08-07 ENCOUNTER — Inpatient Hospital Stay: Admission: RE | Admit: 2014-08-07 | Payer: Medicaid Other | Source: Ambulatory Visit

## 2014-08-17 ENCOUNTER — Telehealth: Payer: Self-pay | Admitting: Neurology

## 2014-08-17 NOTE — Telephone Encounter (Signed)
I called the patient. I explained to him that per Dr. Rexene Alberts, he could take an anti-inflammatory, such as advil or motrin in addition to what he is already taking. I explained to him that she cautioned against using it every day. I promised him that I would follow up on the MRI that was ordered, and that I would pass this along to Dr. Jannifer Franklin in case there is anything else he would like to advise once he is back in the office tomorrow.

## 2014-08-17 NOTE — Telephone Encounter (Signed)
Patients MRI was denied x2.  He was supposed to go to [facility] for an xray.  We have to wait 6 weeks before submitting another request for MRI.  According to notes he was aware of this.

## 2014-08-17 NOTE — Telephone Encounter (Signed)
I Have called [facility] and spoke to them They have order for XRAY Patient can walk in Monday - Friday between 8-4 to have done . I have called patient he is aware of details.

## 2014-08-17 NOTE — Telephone Encounter (Signed)
I called the patient to relay Shannon's message. He stated that he wanted to get his x-ray in [place]. The order was place for [facility] but had not been sent. Hinton Dyer is working on sending the order now.

## 2014-08-17 NOTE — Telephone Encounter (Signed)
Patient is calling and is in a extreme pain in neck.  He states he is on Rx gabapentin and tramadol for pain but it does not help. Please call to discuss another to pain medicine.  He states Dr. Jannifer Franklin also suggested he have an x-ray done but has not heard anything.  Thanks!

## 2014-08-18 ENCOUNTER — Ambulatory Visit
Admission: RE | Admit: 2014-08-18 | Discharge: 2014-08-18 | Disposition: A | Discharge status: Home / Self Care | Payer: Medicaid Other | Source: Ambulatory Visit | Attending: Neurology | Admitting: Neurology

## 2014-08-18 ENCOUNTER — Telehealth: Payer: Self-pay | Admitting: Neurology

## 2014-08-18 DIAGNOSIS — M542 Cervicalgia: Secondary | ICD-10-CM

## 2014-08-18 DIAGNOSIS — M47812 Spondylosis without myelopathy or radiculopathy, cervical region: Secondary | ICD-10-CM | POA: Diagnosis not present

## 2014-08-18 DIAGNOSIS — Z981 Arthrodesis status: Secondary | ICD-10-CM | POA: Insufficient documentation

## 2014-08-18 NOTE — Telephone Encounter (Signed)
Patient called requesting to speak with [facility] RN regarding the possibility of receiving a nerve block injection. Please call and advise.

## 2014-08-18 NOTE — Telephone Encounter (Signed)
I called the patient. MRI of the cervical spine cannot be done, the insurance denied the study. X-ray of the neck was done, does not show any significant abnormalities, fusion at the C3-4 level. The patient is still having a lot of pain issues, particularly on the left side. In the past, epidural steroidal injections were not helpful. The patient will be set up for trigger point injections to see if this is helpful.

## 2014-08-18 NOTE — Telephone Encounter (Signed)
I returned Michael Schmitt's call. I told her it was my understanding that the orders for x-ray (such as this one) placed by our providers are printed and faxed without signatures. I told her I could get Dr. Jannifer Franklin to sign something, I just needed to know exactly what she needed. She spoke to her manager and they determined the information already sent over to them should be sufficient. They will call back if they end up needing a signature.

## 2014-08-18 NOTE — Telephone Encounter (Signed)
    X-ray cervical spine 08/18/2014:  IMPRESSION: Solid fusion changes at C3-4. No complicating features.  Normal alignment and no acute bony findings or significant degenerative changes.

## 2014-08-18 NOTE — Addendum Note (Signed)
Addended by: Margette Fast on: 08/18/2014 05:21 PM   Modules accepted: Orders, Medications

## 2014-08-18 NOTE — Telephone Encounter (Signed)
Toney Reil from [facility] [phone]) called regarding an order that was sent over for the patient, it is missing Dr. Jannifer Franklin' signature. Please call and advise.

## 2014-08-19 ENCOUNTER — Ambulatory Visit (INDEPENDENT_AMBULATORY_CARE_PROVIDER_SITE_OTHER): Payer: Medicaid Other | Admitting: Neurology

## 2014-08-19 ENCOUNTER — Encounter: Payer: Self-pay | Admitting: Neurology

## 2014-08-19 VITALS — BP 135/84 | HR 69

## 2014-08-19 DIAGNOSIS — G4486 Cervicogenic headache: Secondary | ICD-10-CM

## 2014-08-19 DIAGNOSIS — M62838 Other muscle spasm: Secondary | ICD-10-CM

## 2014-08-19 DIAGNOSIS — M47812 Spondylosis without myelopathy or radiculopathy, cervical region: Secondary | ICD-10-CM | POA: Diagnosis not present

## 2014-08-19 DIAGNOSIS — M6248 Contracture of muscle, other site: Secondary | ICD-10-CM | POA: Diagnosis not present

## 2014-08-19 DIAGNOSIS — R51 Headache: Secondary | ICD-10-CM | POA: Diagnosis not present

## 2014-08-19 MED ORDER — TIZANIDINE HCL 2 MG PO TABS: 4.0000 mg | ORAL_TABLET | Freq: Every day | ORAL | Status: DC

## 2014-08-19 MED ORDER — BETAMETHASONE SOD PHOS & ACET 6 (3-3) MG/ML IJ SUSP
30.0000 mg | Freq: Once | INTRAMUSCULAR | Status: AC
  Administered 2014-08-19: 18 mg via INTRAMUSCULAR

## 2014-08-19 MED ORDER — BETAMETHASONE SOD PHOS & ACET 6 (3-3) MG/ML IJ SUSP
30.0000 mg | Freq: Once | INTRAMUSCULAR | Status: AC
  Administered 2014-08-19: 9 mg via INTRAMUSCULAR

## 2014-08-19 MED ORDER — BETAMETHASONE SOD PHOS & ACET 6 (3-3) MG/ML IJ SUSP
30.0000 mg | Freq: Once | INTRAMUSCULAR | Status: AC
  Administered 2014-08-19: 30 mg via INTRAMUSCULAR

## 2014-08-19 NOTE — Progress Notes (Signed)
Reason for visit: Cervicogenic headache  Michael Schmitt is an 44 y.o. male  History of present illness:  Michael Schmitt is a 44 year old right-handed white male with a history of cervical spondylosis associated with chronic cervical spasm and cervicogenic headache. The patient has been given a prescription for Lyrica, but his insurance company would not allow for this medication. The patient was then given a prescription for gabapentin, but he has not been able to tolerate the medication. He feels sleepy, lethargic, and cognitively clouded on the medication. He will come off of the drug. He indicates that his neck is quite stiff in the morning. He feels nauseated at times associated with the headache. The left side of the neck is worse than the right. The patient returns for further evaluation. He comes in for trigger point injections to help treat the neck pain and headache.  Past Medical History  Diagnosis Date  . Hypertension   . Mental disorder   . Anxiety   . Shortness of breath   . RLS (restless legs syndrome)     detected on sleep study  . GERD (gastroesophageal reflux disease)   . Headache(784.0)   . Blood dyscrasia     told that when he was young he was a" free bleeder"  . Arthritis     cerv. stenosis, spondylosis, HNP- lower back , has been followed in pain clinic, has  had injection s in cerv. area  . Chronic kidney disease     renal calculi- passed spontaneously  . Anxiety   . Depression   . Diabetes mellitus without complication   . Cervicogenic headache 07/24/2014  . Cervical spondylosis without myelopathy 07/24/2014    Past Surgical History  Procedure Laterality Date  . Nasal sinus surgery      2005  . Anterior cervical decomp/discectomy fusion  11/13/2011    Procedure: ANTERIOR CERVICAL DECOMPRESSION/DISCECTOMY FUSION 1 LEVEL;  Surgeon: [name], MD;  Location: [facility];  Service: Neurosurgery;  Laterality: N/A;  Anterior Cervical  Decompression/discectomy Fusion. Cervical three-four.    Family History  Problem Relation Age of Onset  . Prostate cancer Father   . Hypertension Mother   . Kidney Stones Mother   . COPD Sister   . Hypertension Sister   . Diabetes Sister     Social history:  reports that he has been smoking.  He does not have any smokeless tobacco history on file. He reports that he drinks alcohol. He reports that he does not use illicit drugs.    Allergies  Allergen Reactions  . Hydrocodone     Headache, irritable    Medications:  Prior to Admission medications   Medication Sig Start Date End Date Taking? Authorizing Provider  atenolol (TENORMIN) 25 MG tablet Take 25 mg by mouth daily before breakfast.     Historical Provider, MD  clonazePAM (KLONOPIN) 1 MG tablet Take 1 mg by mouth 2 (two) times daily.    Historical Provider, MD  esomeprazole (NEXIUM) 40 MG capsule Take 40 mg by mouth daily at 12 noon.    Historical Provider, MD  glimepiride (AMARYL) 1 MG tablet Take 1 mg by mouth daily with breakfast.    Historical Provider, MD  lisinopril (PRINIVIL,ZESTRIL) 5 MG tablet Take 5 mg by mouth daily.    Historical Provider, MD  mirtazapine (REMERON) 30 MG tablet Take 60 mg by mouth at bedtime.    Historical Provider, MD  QUEtiapine (SEROQUEL) 300 MG tablet Take 300 mg by  mouth at bedtime.    Historical Provider, MD  tamsulosin (FLOMAX) 0.4 MG CAPS capsule Take 0.4 mg by mouth daily.    Historical Provider, MD  traMADol (ULTRAM) 50 MG tablet Take 1 tablet (50 mg total) by mouth every 6 (six) hours as needed. Must last 28 days 08/06/14   Kathrynn Ducking, MD    ROS:  Out of a complete 14 system review of symptoms, the patient complains only of the following symptoms, and all other reviewed systems are negative.  Decreased activity, change in appetite Heat intolerance, excessive thirst Wheezing Abdominal pain, diarrhea, nausea, vomiting Restless legs, snoring Difficulty urinating Back pain,  achy muscles, muscle cramps, neck pain, neck stiffness Memory loss, dizziness, headache Behavior problem, confusion, depression, anxiety  Blood pressure 135/84, pulse 69.  Physical Exam  General: The patient is alert and cooperative at the time of the examination.  Neuromuscular: The patient lacks about 20 of full lateral rotation of the cervical spine bilaterally.  Skin: No significant peripheral edema is noted.   Neurologic Exam  Mental status: The patient is alert and oriented x 3 at the time of the examination. The patient has apparent normal recent and remote memory, with an apparently normal attention span and concentration ability.   Cranial nerves: Facial symmetry is present. Speech is normal, no aphasia or dysarthria is noted. Extraocular movements are full. Visual fields are full.  Motor: The patient has good strength in all 4 extremities.  Sensory examination: Soft touch sensation is symmetric on the face, arms, and legs.  Coordination: The patient has good finger-nose-finger and heel-to-shin bilaterally.  Gait and station: The patient has a normal gait. Tandem gait is normal. Romberg is negative. No drift is seen.  Reflexes: Deep tendon reflexes are symmetric.   Assessment/Plan:  1. Cervical spondylosis, chronic neck pain  2. Cervicogenic headache  The patient cannot tolerate the gabapentin, he will stop the medication. The patient will be placed on tizanidine in the evening hours, the dose can be increased if he can tolerate the drug and it is effective. The patient has an appointment to be seen in August 2016. The patient will contact me if he believes that the trigger point injections were helpful, this can be repeated in the future.  Jill Alexanders MD 08/19/2014 8:37 PM  [facility] [address] [place], [place] [address]  Phone [phone] Fax [phone]

## 2014-08-19 NOTE — Telephone Encounter (Signed)
Appointment scheduled for today at 4 PM.

## 2014-08-19 NOTE — Procedures (Signed)
    History: Michael Schmitt is a 44 year old gentleman with a chronic history of neck discomfort, and cervicogenic headache. The patient has trigger points along the shoulders and upper neck. The patient returns for her point injections today.  Description of procedure:  A 1:1 mixture of bupivacaine and betamethasone was used. A 0.5% bupivacaine solution was used, and the betamethasone concentration was 30 mg per 5 mL solution.  Bilateral injections were done in the upper cervical paraspinal muscles, upper trapezius muscle, and middle trapezius muscle. 0.5 mL solution was injected into the upper cervical paraspinal muscles on 2 locations on both sides, and 1 mL of solution was injected into the upper trapezius and middle trapezius muscle bilaterally.  The patient tolerated procedure well, patient were noted.  Bupivacaine 0.5%  NDC 5515 0-1 6 9-1 0 Lot number: [id number] Expiration date: December 2017

## 2014-08-19 NOTE — Telephone Encounter (Signed)
Please see phone note on 5/23.

## 2014-08-19 NOTE — Patient Instructions (Signed)

## 2014-08-20 ENCOUNTER — Telehealth: Payer: Self-pay | Admitting: Neurology

## 2014-08-20 NOTE — Telephone Encounter (Signed)
I called the patient. He stated that the injections seemed to help right after he left our office yesterday but when he tried to go to sleep last night his head began hurting again. He described the pain as "feeling like someone is pressing on his temples." He stated this is the same type of pain he had before, but it is much worse now.

## 2014-08-20 NOTE — Telephone Encounter (Signed)
Patient's sister is calling regarding the patient. The patient was seen in our office yesterday and had trigger point injections. Today the patient is in so much pain and the pain is making him very sick on his stomach. Please call and discuss.

## 2014-08-20 NOTE — Telephone Encounter (Signed)
I called the patient. The patient has had increased headache today. The patient actually feels a little bit better with his neck and shoulders. The patient has not yet started the tizanidine that I called in. He will get on the medication. I have also recommended physical therapy, he will call me back with the name of a therapist that he prefers.

## 2014-08-28 ENCOUNTER — Emergency Department
Admission: EM | Admit: 2014-08-28 | Discharge: 2014-08-28 | Discharge status: Against Medical Advice | Payer: Medicaid Other | Attending: Emergency Medicine | Admitting: Emergency Medicine

## 2014-08-28 DIAGNOSIS — R112 Nausea with vomiting, unspecified: Secondary | ICD-10-CM | POA: Insufficient documentation

## 2014-08-28 DIAGNOSIS — R1010 Upper abdominal pain, unspecified: Secondary | ICD-10-CM | POA: Insufficient documentation

## 2014-08-28 DIAGNOSIS — Z720 Tobacco use: Secondary | ICD-10-CM | POA: Insufficient documentation

## 2014-08-28 DIAGNOSIS — N189 Chronic kidney disease, unspecified: Secondary | ICD-10-CM | POA: Diagnosis not present

## 2014-08-28 DIAGNOSIS — I129 Hypertensive chronic kidney disease with stage 1 through stage 4 chronic kidney disease, or unspecified chronic kidney disease: Secondary | ICD-10-CM | POA: Diagnosis not present

## 2014-08-28 DIAGNOSIS — R197 Diarrhea, unspecified: Secondary | ICD-10-CM | POA: Diagnosis not present

## 2014-08-28 DIAGNOSIS — E119 Type 2 diabetes mellitus without complications: Secondary | ICD-10-CM | POA: Diagnosis not present

## 2014-08-28 HISTORY — DX: Fatty (change of) liver, not elsewhere classified: K76.0

## 2014-08-28 LAB — COMPREHENSIVE METABOLIC PANEL
ALBUMIN: 4.3 g/dL (ref 3.5–5.0)
ALT: 45 U/L (ref 17–63)
AST: 30 U/L (ref 15–41)
Alkaline Phosphatase: 95 U/L (ref 38–126)
Anion gap: 8 (ref 5–15)
BUN: 16 mg/dL (ref 6–20)
CALCIUM: 9.6 mg/dL (ref 8.9–10.3)
CO2: 26 mmol/L (ref 22–32)
CREATININE: 1.25 mg/dL — AB (ref 0.61–1.24)
Chloride: 106 mmol/L (ref 101–111)
GFR calc Af Amer: >60 mL/min (ref >60)
GFR calc non Af Amer: >60 mL/min (ref >60)
Glucose, Bld: 131 mg/dL — ABNORMAL HIGH (ref 65–99)
Potassium: 3.9 mmol/L (ref 3.5–5.1)
Sodium: 140 mmol/L (ref 135–145)
Total Bilirubin: 0.5 mg/dL (ref 0.3–1.2)
Total Protein: 7.6 g/dL (ref 6.5–8.1)

## 2014-08-28 LAB — CBC WITH DIFFERENTIAL/PLATELET
Basophils Absolute: 0.1 10*3/uL (ref 0–0.1)
Basophils Relative: 2 %
EOS ABS: 0.1 10*3/uL (ref 0–0.7)
EOS PCT: 1 %
HCT: 47.2 % (ref 40.0–52.0)
Hemoglobin: 15.9 g/dL (ref 13.0–18.0)
LYMPHS ABS: 2.1 10*3/uL (ref 1.0–3.6)
Lymphocytes Relative: 23 %
MCH: 31.5 pg (ref 26.0–34.0)
MCHC: 33.8 g/dL (ref 32.0–36.0)
MCV: 93.3 fL (ref 80.0–100.0)
Monocytes Absolute: 0.5 10*3/uL (ref 0.2–1.0)
Monocytes Relative: 6 %
Neutro Abs: 6.1 10*3/uL (ref 1.4–6.5)
Neutrophils Relative %: 68 %
PLATELETS: 244 10*3/uL (ref 150–440)
RBC: 5.06 MIL/uL (ref 4.40–5.90)
RDW: 14.9 % — ABNORMAL HIGH (ref 11.5–14.5)
WBC: 8.9 10*3/uL (ref 3.8–10.6)

## 2014-08-28 LAB — URINALYSIS COMPLETE WITH MICROSCOPIC (ARMC ONLY)
BACTERIA UA: NONE SEEN
Bilirubin Urine: NEGATIVE
Glucose, UA: NEGATIVE mg/dL
HGB URINE DIPSTICK: NEGATIVE
Ketones, ur: NEGATIVE mg/dL
Leukocytes, UA: NEGATIVE
Nitrite: NEGATIVE
Protein, ur: NEGATIVE mg/dL
Specific Gravity, Urine: 1.02 (ref 1.005–1.030)
pH: 5 (ref 5.0–8.0)

## 2014-08-28 LAB — LIPASE, BLOOD: Lipase: 36 U/L (ref 22–51)

## 2014-08-28 MED ORDER — GI COCKTAIL ~~LOC~~
30.0000 mL | Freq: Once | ORAL | Status: AC
  Administered 2014-08-28: 30 mL via ORAL

## 2014-08-28 MED ORDER — GI COCKTAIL ~~LOC~~
ORAL | Status: AC
Start: 2014-08-28 — End: 2014-08-28
  Administered 2014-08-28: 30 mL via ORAL
  Filled 2014-08-28: qty 30

## 2014-08-28 NOTE — ED Notes (Signed)
Pt c/o upper abd pain with N/V/D for the past week..states he has only had ensure shakes the past 2 days.Marland Kitchen

## 2014-08-31 ENCOUNTER — Telehealth: Payer: Self-pay | Admitting: Emergency Medicine

## 2014-08-31 NOTE — ED Notes (Signed)
Called pt due to lwobs to ask about condition and follow up plans.  Patient says he is still sick and thinks he has a stomach ulcer.  Says pcp is Dr. Humphrey Rolls at [place].  I told him to call office to let his pcp know about his condition.  Patient agreed.

## 2014-09-03 ENCOUNTER — Emergency Department
Admission: EM | Admit: 2014-09-03 | Discharge: 2014-09-03 | Disposition: A | Discharge status: Home / Self Care | Payer: Medicaid Other | Attending: Emergency Medicine | Admitting: Emergency Medicine

## 2014-09-03 ENCOUNTER — Encounter: Payer: Self-pay | Admitting: Emergency Medicine

## 2014-09-03 ENCOUNTER — Emergency Department: Payer: Medicaid Other

## 2014-09-03 DIAGNOSIS — M549 Dorsalgia, unspecified: Secondary | ICD-10-CM | POA: Diagnosis not present

## 2014-09-03 DIAGNOSIS — Z720 Tobacco use: Secondary | ICD-10-CM | POA: Insufficient documentation

## 2014-09-03 DIAGNOSIS — G8929 Other chronic pain: Secondary | ICD-10-CM | POA: Insufficient documentation

## 2014-09-03 DIAGNOSIS — Z79899 Other long term (current) drug therapy: Secondary | ICD-10-CM | POA: Insufficient documentation

## 2014-09-03 DIAGNOSIS — K2970 Gastritis, unspecified, without bleeding: Secondary | ICD-10-CM | POA: Diagnosis not present

## 2014-09-03 DIAGNOSIS — N189 Chronic kidney disease, unspecified: Secondary | ICD-10-CM | POA: Diagnosis not present

## 2014-09-03 DIAGNOSIS — R109 Unspecified abdominal pain: Secondary | ICD-10-CM

## 2014-09-03 DIAGNOSIS — R1084 Generalized abdominal pain: Secondary | ICD-10-CM | POA: Diagnosis present

## 2014-09-03 DIAGNOSIS — E119 Type 2 diabetes mellitus without complications: Secondary | ICD-10-CM | POA: Insufficient documentation

## 2014-09-03 DIAGNOSIS — I129 Hypertensive chronic kidney disease with stage 1 through stage 4 chronic kidney disease, or unspecified chronic kidney disease: Secondary | ICD-10-CM | POA: Insufficient documentation

## 2014-09-03 LAB — CBC WITH DIFFERENTIAL/PLATELET
BASOS ABS: 0.1 10*3/uL (ref 0–0.1)
Basophils Relative: 1 %
EOS ABS: 0.2 10*3/uL (ref 0–0.7)
EOS PCT: 2 %
HCT: 47.6 % (ref 40.0–52.0)
HEMOGLOBIN: 16.2 g/dL (ref 13.0–18.0)
LYMPHS PCT: 19 %
Lymphs Abs: 1.9 10*3/uL (ref 1.0–3.6)
MCH: 31.9 pg (ref 26.0–34.0)
MCHC: 34.1 g/dL (ref 32.0–36.0)
MCV: 93.5 fL (ref 80.0–100.0)
MONOS PCT: 5 %
Monocytes Absolute: 0.5 10*3/uL (ref 0.2–1.0)
NEUTROS ABS: 7.3 10*3/uL — AB (ref 1.4–6.5)
Neutrophils Relative %: 73 %
PLATELETS: 241 10*3/uL (ref 150–440)
RBC: 5.09 MIL/uL (ref 4.40–5.90)
RDW: 14.6 % — ABNORMAL HIGH (ref 11.5–14.5)
WBC: 9.9 10*3/uL (ref 3.8–10.6)

## 2014-09-03 LAB — URINALYSIS COMPLETE WITH MICROSCOPIC (ARMC ONLY)
BACTERIA UA: NONE SEEN
Bilirubin Urine: NEGATIVE
Glucose, UA: NEGATIVE mg/dL
Hgb urine dipstick: NEGATIVE
KETONES UR: NEGATIVE mg/dL
LEUKOCYTES UA: NEGATIVE
Nitrite: NEGATIVE
PROTEIN: 30 mg/dL — AB
Specific Gravity, Urine: 1.033 — ABNORMAL HIGH (ref 1.005–1.030)
pH: 5 (ref 5.0–8.0)

## 2014-09-03 LAB — COMPREHENSIVE METABOLIC PANEL
ALK PHOS: 100 U/L (ref 38–126)
ALT: 43 U/L (ref 17–63)
AST: 30 U/L (ref 15–41)
Albumin: 4 g/dL (ref 3.5–5.0)
Anion gap: 8 (ref 5–15)
BILIRUBIN TOTAL: 0.5 mg/dL (ref 0.3–1.2)
BUN: 12 mg/dL (ref 6–20)
CHLORIDE: 105 mmol/L (ref 101–111)
CO2: 25 mmol/L (ref 22–32)
CREATININE: 1.03 mg/dL (ref 0.61–1.24)
Calcium: 9.1 mg/dL (ref 8.9–10.3)
GFR calc non Af Amer: >60 mL/min (ref >60)
Glucose, Bld: 128 mg/dL — ABNORMAL HIGH (ref 65–99)
Potassium: 4.2 mmol/L (ref 3.5–5.1)
SODIUM: 138 mmol/L (ref 135–145)
Total Protein: 7.1 g/dL (ref 6.5–8.1)

## 2014-09-03 MED ORDER — PANTOPRAZOLE SODIUM 40 MG PO TBEC: 40.0000 mg | DELAYED_RELEASE_TABLET | Freq: Every day | ORAL | Status: DC

## 2014-09-03 MED ORDER — PROMETHAZINE HCL 12.5 MG PO TABS: 12.5000 mg | ORAL_TABLET | Freq: Four times a day (QID) | ORAL | Status: DC | PRN

## 2014-09-03 MED ORDER — ONDANSETRON HCL 4 MG/2ML IJ SOLN
4.0000 mg | Freq: Once | INTRAMUSCULAR | Status: AC
  Administered 2014-09-03: 4 mg via INTRAVENOUS

## 2014-09-03 MED ORDER — ONDANSETRON HCL 4 MG/2ML IJ SOLN
INTRAMUSCULAR | Status: AC
Start: 2014-09-03 — End: 2014-09-03
  Administered 2014-09-03: 4 mg via INTRAVENOUS
  Filled 2014-09-03: qty 2

## 2014-09-03 NOTE — Discharge Instructions (Signed)
Gastritis, Adult °Gastritis is soreness and puffiness (inflammation) of the lining of the stomach. If you do not get help, gastritis can cause bleeding and sores (ulcers) in the stomach. °HOME CARE  °· Only take medicine as told by your doctor. °· If you were given antibiotic medicines, take them as told. Finish the medicines even if you start to feel better. °· Drink enough fluids to keep your pee (urine) clear or pale yellow. °· Avoid foods and drinks that make your problems worse. Foods you may want to avoid include: °¨ Caffeine or alcohol. °¨ Chocolate. °¨ Mint. °¨ Garlic and onions. °¨ Spicy foods. °¨ Citrus fruits, including oranges, lemons, or limes. °¨ Food containing tomatoes, including sauce, chili, salsa, and pizza. °¨ Fried and fatty foods. °· Eat small meals throughout the day instead of large meals. °GET HELP RIGHT AWAY IF:  °· You have black or dark red poop (stools). °· You throw up (vomit) blood. It may look like coffee grounds. °· You cannot keep fluids down. °· Your belly (abdominal) pain gets worse. °· You have a fever. °· You do not feel better after 1 week. °· You have any other questions or concerns. °MAKE SURE YOU:  °· Understand these instructions. °· Will watch your condition. °· Will get help right away if you are not doing well or get worse. °Document Released: 08/30/2007 Document Revised: 06/05/2011 Document Reviewed: 04/26/2011 °ExitCare® Patient Information ©2015 [employer]. This information is not intended to replace advice given to you by your health care provider. Make sure you discuss any questions you have with your health care provider. ° °

## 2014-09-03 NOTE — ED Notes (Signed)
Pt here with c/o lower abd pain for past 2 weeks now, diarrhea for 2 weeks also. Pt states his stomach feels full of gas. Also c/o lower back pain.

## 2014-09-03 NOTE — ED Provider Notes (Signed)
[facility] Emergency Department Provider Note  Time seen: 3:07 PM  I have reviewed the triage vital signs and the nursing notes.   HISTORY  Chief Complaint Abdominal Pain    HPI Michael Schmitt is a 44 y.o. male with past medical history of hypertension, anxiety, reflux, arthritis, chronic pain, diabetes, fatty liver who presents the emergency department with diffuse abdominal pain 2 weeks. According to the patient passed several weeks he's had gradually worsening upper abdominal pain. States he gets nauseated every time he eats and often times has diarrhea within 30 minutes of eating. Denies any fever, bloody or black stool or vomit. Denies dysuria. Patient states he has been on chronic Percocet for years for his chronic pain however 3 months ago the patient was taken off all narcotics and since that time his pain has become much worse. He has been taking Goody's powders every day for the past several weeks oftentimes multiple times per day with no relief. He states Tylenol does not work for him. He sees a neurologist for his chronic pain, and is currently prescribed tramadol but he states this does not work either. Denies any incontinence issues. Patient describes his pain as upper abdominal, 10/10 at times, associated with nausea/vomiting/diarrhea. Denies any worsening of pain when he eats but states his nausea worsens when he eats. He has been diagnosed with gastritis in the past several years ago when he had an endoscopy. He has not followed up with GI medicine since.    Past Medical History  Diagnosis Date  . Hypertension   . Mental disorder   . Anxiety   . Shortness of breath   . RLS (restless legs syndrome)     detected on sleep study  . GERD (gastroesophageal reflux disease)   . Headache(784.0)   . Blood dyscrasia     told that when he was young he was a" free bleeder"  . Arthritis     cerv. stenosis, spondylosis, HNP- lower back , has been followed  in pain clinic, has  had injection s in cerv. area  . Chronic kidney disease     renal calculi- passed spontaneously  . Anxiety   . Depression   . Diabetes mellitus without complication   . Cervicogenic headache 07/24/2014  . Cervical spondylosis without myelopathy 07/24/2014  . Fatty liver     Patient Active Problem List   Diagnosis Date Noted  . Cervicogenic headache 07/24/2014  . Cervical spondylosis without myelopathy 07/24/2014    Past Surgical History  Procedure Laterality Date  . Nasal sinus surgery      2005  . Anterior cervical decomp/discectomy fusion  11/13/2011    Procedure: ANTERIOR CERVICAL DECOMPRESSION/DISCECTOMY FUSION 1 LEVEL;  Surgeon: [name], MD;  Location: [facility];  Service: Neurosurgery;  Laterality: N/A;  Anterior Cervical Decompression/discectomy Fusion. Cervical three-four.    Current Outpatient Rx  Name  Route  Sig  Dispense  Refill  . atenolol (TENORMIN) 25 MG tablet   Oral   Take 25 mg by mouth daily before breakfast.          . clonazePAM (KLONOPIN) 1 MG tablet   Oral   Take 1 mg by mouth 2 (two) times daily.         Marland Kitchen esomeprazole (NEXIUM) 40 MG capsule   Oral   Take 40 mg by mouth daily at 12 noon.         Marland Kitchen glimepiride (AMARYL) 1 MG tablet   Oral  Take 1 mg by mouth daily with breakfast.         . lisinopril (PRINIVIL,ZESTRIL) 5 MG tablet   Oral   Take 5 mg by mouth daily.         . mirtazapine (REMERON) 30 MG tablet   Oral   Take 60 mg by mouth at bedtime.         Marland Kitchen QUEtiapine (SEROQUEL) 300 MG tablet   Oral   Take 300 mg by mouth at bedtime.         . tamsulosin (FLOMAX) 0.4 MG CAPS capsule   Oral   Take 0.4 mg by mouth daily.         Marland Kitchen tiZANidine (ZANAFLEX) 2 MG tablet   Oral   Take 2 tablets (4 mg total) by mouth at bedtime.   60 tablet   2   . traMADol (ULTRAM) 50 MG tablet   Oral   Take 1 tablet (50 mg total) by mouth every 6 (six) hours as needed. Must last 28 days   50 tablet   1      Allergies Hydrocodone  Family History  Problem Relation Age of Onset  . Prostate cancer Father   . Hypertension Mother   . Kidney Stones Mother   . COPD Sister   . Hypertension Sister   . Diabetes Sister     Social History History  Substance Use Topics  . Smoking status: Current Every Day Smoker -- 1.50 packs/day for 17 years  . Smokeless tobacco: Never Used  . Alcohol Use: Yes     Comment: beer- 1-2 x's per month     Review of Systems Constitutional: Negative for fever. Cardiovascular: Negative for chest pain. Respiratory: Negative for shortness of breath. Gastrointestinal: Positive for upper abdominal pain, nausea, vomiting, diarrhea. Genitourinary: Negative for dysuria. Musculoskeletal: Positive for back pain but states this is chronic. Neurological: Negative for headache 10-point ROS otherwise negative.  ____________________________________________   PHYSICAL EXAM:  VITAL SIGNS: ED Triage Vitals  Enc Vitals Group     BP 09/03/14 1239 134/82 mmHg     Pulse Rate 09/03/14 1239 66     Resp 09/03/14 1239 18     Temp 09/03/14 1239 97.5 F (36.4 C)     Temp Source 09/03/14 1239 Oral     SpO2 09/03/14 1239 97 %     Weight 09/03/14 1239 210 lb (95.255 kg)     Height 09/03/14 1239 6\' 2"  (1.88 m)     Head Cir --      Peak Flow --      Pain Score 09/03/14 1241 8     Pain Loc --      Pain Edu? --      Excl. in [place]? --     Constitutional: Alert and oriented. Well appearing and in no distress. ENT   Head: Normocephalic and atraumatic.   Mouth/Throat: Mucous membranes are moist. Cardiovascular: Normal rate, regular rhythm. No murmurs Respiratory: Normal respiratory effort without tachypnea nor retractions. Breath sounds are clear  Gastrointestinal: Soft and nontender. No distention.   Musculoskeletal: Nontender with normal range of motion in all extremities.  Neurologic:  Normal speech and language. No gross focal neurologic deficits  Skin:  Skin is  warm, dry and intact.  Psychiatric: Mood and affect are normal. Speech and behavior are normal.   ____________________________________________   INITIAL IMPRESSION / ASSESSMENT AND PLAN / ED COURSE  Pertinent labs & imaging results that were available during my care of  the patient were reviewed by me and considered in my medical decision making (see chart for details).  44 year old male with multiple men medical problems presents with upper abdominal pain 2-3 weeks. States he is seen here several days ago but left as it was taking too long to be seen. We will check a quick CT scan, noncontrasted to further evaluate. Patient's symptoms are highly suggestive of gastritis, especially given his recent increase aspirin/NSAID use to treat his chronic pain. I discussed with the patient that we will not be writing narcotics for his chronic pain, and he is to follow-up with pain management. Patient takes Nexium on a daily basis, I have discussed with the patient discontinuing Nexium and starting Protonix, as well as using Maalox when necessary. Patient is agreeable to this plan.  Labs are within normal limits. CT scan is within normal limits. We'll discharge the patient home with GI medicine follow-up. Patient agreeable to plan. ____________________________________________   FINAL CLINICAL IMPRESSION(S) / ED DIAGNOSES  Gastritis Upper abdominal pain   Harvest Dark, MD 09/03/14 1513

## 2014-09-10 ENCOUNTER — Other Ambulatory Visit: Payer: Self-pay | Admitting: Nurse Practitioner

## 2014-09-10 DIAGNOSIS — K2970 Gastritis, unspecified, without bleeding: Secondary | ICD-10-CM

## 2014-09-10 DIAGNOSIS — R1013 Epigastric pain: Secondary | ICD-10-CM

## 2014-09-16 ENCOUNTER — Ambulatory Visit
Admission: RE | Admit: 2014-09-16 | Discharge: 2014-09-16 | Disposition: A | Discharge status: Home / Self Care | Payer: Medicaid Other | Source: Ambulatory Visit | Attending: Nurse Practitioner | Admitting: Nurse Practitioner

## 2014-09-16 DIAGNOSIS — K3189 Other diseases of stomach and duodenum: Secondary | ICD-10-CM | POA: Diagnosis not present

## 2014-09-16 DIAGNOSIS — K2970 Gastritis, unspecified, without bleeding: Secondary | ICD-10-CM

## 2014-09-16 DIAGNOSIS — R1013 Epigastric pain: Secondary | ICD-10-CM

## 2014-10-05 ENCOUNTER — Encounter: Payer: Self-pay | Admitting: General Surgery

## 2014-10-05 ENCOUNTER — Ambulatory Visit (INDEPENDENT_AMBULATORY_CARE_PROVIDER_SITE_OTHER): Payer: Medicaid Other | Admitting: General Surgery

## 2014-10-05 VITALS — BP 140/80 | HR 78 | Resp 14 | Ht 74.0 in | Wt 207.0 lb

## 2014-10-05 DIAGNOSIS — K838 Other specified diseases of biliary tract: Secondary | ICD-10-CM | POA: Diagnosis not present

## 2014-10-05 DIAGNOSIS — R1031 Right lower quadrant pain: Secondary | ICD-10-CM

## 2014-10-05 DIAGNOSIS — R634 Abnormal weight loss: Secondary | ICD-10-CM

## 2014-10-05 NOTE — Progress Notes (Signed)
Patient ID: Michael Schmitt, male   DOB: [birth year], 44 y.o.   MRN: [id number]  Chief Complaint  Patient presents with  . Other    gallstones    HPI Michael Schmitt is a 44 y.o. male here today for a evaluation of gallstones. Patient states he has been having abdominal pain for a year and a half. He states the pain is in his right lower quadrant and radiation to his right lower back. The pain is constant. He has had some nausea but rare vomiting. He reports that his peak weight was 220 pounds prior to the onset of his pain. He has had multiple evaluations in the emergency department and private physician offices. Multiple CTs and ultrasounds completed. An earlier ultrasound completed in spring 2016 was notable only for fatty liver. He had a ultrasound performed at his PCP office on 09/16/14 that suggested the possibility of cholelithiasis as well as common bile duct dilatation.    The patient was unable to pinpoint any particular foods that are troublesome, reporting that all dietary intake produces some abdominal discomfort. His appetite has been down more so than experiencing early satiety. He did report a modest change in his bowels occasional pudding-like stools. No history of rectal bleeding or mucus in the stools.  The patient has had multiple orthopedic procedures with less than ideal results reported. At the end of his visits he described having retrosternal pain, burning in nature when he lays down at night. A GI appointment is scheduled for next month.  He reports he was only recently diagnosed with glucose intolerance and started on oral medications for diabetes.  He reports he had previously been followed in a pain management clinic but had been discharged due to some discrepancy between the pills he had with him and the pills that were expected. He had been making use of Percocet 10 last 325 4 times a day. In its place he has been consuming a significant number of Goody's/BC powders.  Its with this recent substitution of medications that he is noticed increased retrosternal discomfort. This has persisted in spite of multiple changes of his PPI.   Sister Jayion Favinger was present with patient, as well as his 20 year old son. HPI  Past Medical History  Diagnosis Date  . Hypertension   . Mental disorder   . Anxiety   . Shortness of breath   . RLS (restless legs syndrome)     detected on sleep study  . GERD (gastroesophageal reflux disease)   . Headache(784.0)   . Blood dyscrasia     told that when he was young he was a" free bleeder"  . Arthritis     cerv. stenosis, spondylosis, HNP- lower back , has been followed in pain clinic, has  had injection s in cerv. area  . Chronic kidney disease     renal calculi- passed spontaneously  . Anxiety   . Depression   . Diabetes mellitus without complication   . Cervicogenic headache 07/24/2014  . Cervical spondylosis without myelopathy 07/24/2014  . Fatty liver     Past Surgical History  Procedure Laterality Date  . Nasal sinus surgery      2005  . Anterior cervical decomp/discectomy fusion  11/13/2011    Procedure: ANTERIOR CERVICAL DECOMPRESSION/DISCECTOMY FUSION 1 LEVEL;  Surgeon: [name], MD;  Location: [place] [facility];  Service: Neurosurgery;  Laterality: N/A;  Anterior Cervical Decompression/discectomy Fusion. Cervical three-four.    Family History  Problem Relation Age of Onset  .  Prostate cancer Father   . Hypertension Mother   . Kidney Stones Mother   . COPD Sister   . Hypertension Sister   . Diabetes Sister     Social History History  Substance Use Topics  . Smoking status: Current Every Day Smoker -- 1.50 packs/day for 17 years  . Smokeless tobacco: Never Used  . Alcohol Use: Yes     Comment: beer- 1-2 x's per month     Allergies  Allergen Reactions  . Hydrocodone     Headache, irritable    Current Outpatient Prescriptions  Medication Sig Dispense Refill  . atenolol (TENORMIN) 25 MG  tablet Take 25 mg by mouth daily before breakfast.     . clonazePAM (KLONOPIN) 1 MG tablet Take 1 mg by mouth 2 (two) times daily.    Marland Kitchen esomeprazole (NEXIUM) 40 MG capsule Take 40 mg by mouth daily at 12 noon.    Marland Kitchen glimepiride (AMARYL) 1 MG tablet Take 1 mg by mouth daily with breakfast.    . lisinopril (PRINIVIL,ZESTRIL) 5 MG tablet Take 5 mg by mouth daily.    . mirtazapine (REMERON) 30 MG tablet Take 60 mg by mouth at bedtime.    . pantoprazole (PROTONIX) 40 MG tablet Take 1 tablet (40 mg total) by mouth daily. 30 tablet 1  . Probiotic Product (PROBIOTIC ADVANCED PO) Take by mouth.    . promethazine (PHENERGAN) 12.5 MG tablet Take 1 tablet (12.5 mg total) by mouth every 6 (six) hours as needed for nausea or vomiting. 30 tablet 0  . QUEtiapine (SEROQUEL) 300 MG tablet Take 300 mg by mouth at bedtime.    . tamsulosin (FLOMAX) 0.4 MG CAPS capsule Take 0.4 mg by mouth daily.    Marland Kitchen tiZANidine (ZANAFLEX) 2 MG tablet Take 2 tablets (4 mg total) by mouth at bedtime. 60 tablet 2  . traMADol (ULTRAM) 50 MG tablet Take 1 tablet (50 mg total) by mouth every 6 (six) hours as needed. Must last 28 days 50 tablet 1   No current facility-administered medications for this visit.    Review of Systems Review of Systems  Constitutional: Positive for chills and appetite change.  HENT: Negative.   Eyes: Negative.   Respiratory: Negative.   Cardiovascular: Negative.   Gastrointestinal: Positive for nausea, vomiting, abdominal pain and diarrhea. Negative for constipation, blood in stool, abdominal distention, anal bleeding and rectal pain.  Endocrine: Negative.   Hematological: Negative.     Blood pressure 140/80, pulse 78, resp. rate 14, height 6\' 2"  (1.88 m), weight 207 lb (93.895 kg).  Physical Exam Physical Exam  Constitutional: He is oriented to person, place, and time. He appears well-developed and well-nourished.  HENT:  Mouth/Throat: Oropharynx is clear and moist.  Eyes: Conjunctivae are normal.  No scleral icterus.  Neck: Neck supple.  Cardiovascular: Normal rate, regular rhythm and normal heart sounds.   Pulmonary/Chest: Effort normal and breath sounds normal.  Abdominal: Soft. Normal appearance and bowel sounds are normal. There is no hepatosplenomegaly or hepatomegaly. There is tenderness in the right lower quadrant and left lower quadrant. No hernia.    Lymphadenopathy:    He has no cervical adenopathy.  Neurological: He is alert and oriented to person, place, and time.  Skin: Skin is warm and dry.  Psychiatric: He has a normal mood and affect.    Data Reviewed Emergency room records from June 2016 including noncontrast CT and laboratory studies were reviewed. Serum transaminases and alkaline phosphatase were normal. No hepatic, biliary or  pancreatic lesions noted. No renal calculi. Abdominal wall was unremarkable on my review.  PCP records up to and including 10/01/2014 were reviewed.  Recently completed upper GI series showed a normal esophagus, and suggested a prominent gastric mucosal thickening thought secondary to gastritis. Gastric malignancy could not be excluded. No reflux was evident on my review the films and none was reported by the radiologist.  Abdominal ultrasound completed in office on 04/16/2014 showed a normal gallbladder. On bile duct 5 mm.  Abdominal ultrasound completed in office on 09/25/2014 suggested a shadowing echogenic foci in the anticipated cystic duct region suspicious for a calculus. Proximal common bile duct was dilated to 11 mm suggesting distal obstruction. New from 04/16/2014 exam area at the 2 ultrasounds noted above are not available for review.  Assessment    Long history of abdominal pain predating by at least 12 months the possible identification of cholelithiasis/common bile duct dilatation.    Plan    Based on the patient's history and clinical exam, by suspicions for a biliary source of his 18 month history of abdominal pain is  unlikely. With the language of the ultrasound report and the new report of common bile duct dilatation, confirmation of biliary calculi with or without choledocholithiasis would be critical to avoid an unnecessary procedure and certainly would provide guidance if common bile duct stones are identified necessitating preoperative ERCP and stone extraction.  We'll arrange for an MRCP if this can be cleared with his insurance provider.  In regards to his increasing episodes of retrosternal pain, he is going to need to decide whether the aspirin products can be excluded for a week to see if this doesn't improve his reflux symptoms.    Patient has been scheduled for an MRCP at [facility] for Friday, 10-09-14 at 7:30 am (arrive 7 am). Prep: NPO after midnight.    PCP:  Vanetta Shawl 10/06/2014, 8:08 PM

## 2014-10-05 NOTE — Patient Instructions (Addendum)
Patient to have a MRI.      Patient has been scheduled for an MRCP at [facility] for Friday, 10-09-14 at 7:30 am (arrive 7 am). Prep: NPO after midnight.

## 2014-10-09 ENCOUNTER — Ambulatory Visit
Admission: RE | Admit: 2014-10-09 | Discharge: 2014-10-09 | Disposition: A | Discharge status: Home / Self Care | Payer: Medicaid Other | Source: Ambulatory Visit | Attending: General Surgery | Admitting: General Surgery

## 2014-10-09 DIAGNOSIS — K838 Other specified diseases of biliary tract: Secondary | ICD-10-CM

## 2014-10-09 DIAGNOSIS — N281 Cyst of kidney, acquired: Secondary | ICD-10-CM | POA: Insufficient documentation

## 2014-10-09 MED ORDER — GADOBENATE DIMEGLUMINE 529 MG/ML IV SOLN
20.0000 mL | Freq: Once | INTRAVENOUS | Status: AC | PRN
  Administered 2014-10-09: 19 mL via INTRAVENOUS

## 2014-10-12 ENCOUNTER — Other Ambulatory Visit: Payer: Self-pay | Admitting: Neurology

## 2014-10-12 NOTE — Telephone Encounter (Signed)
Rx signed and faxed.

## 2014-10-13 ENCOUNTER — Telehealth: Payer: Self-pay

## 2014-10-13 NOTE — Telephone Encounter (Signed)
-----   Message from Robert Bellow, MD sent at 10/13/2014  8:20 AM EDT ----- Please notify the patient that the MRI did not show any gallstones. There is no indication for removal of the gallbladder.  He should continue follow-up with his PCP. A copy of the report should be forwarded to Leretha Pol NP. ----- Message -----    From: Rad Results In Interface    Sent: 10/09/2014   9:18 AM      To: Robert Bellow, MD

## 2014-10-13 NOTE — Telephone Encounter (Signed)
Notified patient as instructed, patient pleased. Discussed follow-up with his PCP, patient agrees. Copy of MRI forwarded to Leretha Pol NP.

## 2014-10-29 ENCOUNTER — Telehealth: Payer: Self-pay

## 2014-10-29 ENCOUNTER — Ambulatory Visit: Payer: Medicaid Other | Admitting: Neurology

## 2014-10-29 NOTE — Telephone Encounter (Signed)
Patient canceled appointment the morning of the appointment.

## 2015-01-01 ENCOUNTER — Inpatient Hospital Stay (HOSPITAL_COMMUNITY)
Admission: EM | Admit: 2015-01-01 | Discharge: 2015-01-03 | DRG: 247 | Disposition: A | Discharge status: Home / Self Care | Payer: Medicaid Other | Attending: Internal Medicine | Admitting: Internal Medicine

## 2015-01-01 ENCOUNTER — Encounter (HOSPITAL_COMMUNITY)
Admission: EM | Disposition: A | Discharge status: Home / Self Care | Payer: Medicaid Other | Attending: Internal Medicine

## 2015-01-01 ENCOUNTER — Encounter (HOSPITAL_COMMUNITY): Payer: Self-pay | Admitting: Emergency Medicine

## 2015-01-01 DIAGNOSIS — F17210 Nicotine dependence, cigarettes, uncomplicated: Secondary | ICD-10-CM | POA: Diagnosis present

## 2015-01-01 DIAGNOSIS — N189 Chronic kidney disease, unspecified: Secondary | ICD-10-CM | POA: Diagnosis present

## 2015-01-01 DIAGNOSIS — G2581 Restless legs syndrome: Secondary | ICD-10-CM | POA: Diagnosis present

## 2015-01-01 DIAGNOSIS — Z981 Arthrodesis status: Secondary | ICD-10-CM | POA: Diagnosis not present

## 2015-01-01 DIAGNOSIS — K219 Gastro-esophageal reflux disease without esophagitis: Secondary | ICD-10-CM | POA: Diagnosis present

## 2015-01-01 DIAGNOSIS — I2111 ST elevation (STEMI) myocardial infarction involving right coronary artery: Secondary | ICD-10-CM | POA: Diagnosis present

## 2015-01-01 DIAGNOSIS — E785 Hyperlipidemia, unspecified: Secondary | ICD-10-CM | POA: Diagnosis present

## 2015-01-01 DIAGNOSIS — M1990 Unspecified osteoarthritis, unspecified site: Secondary | ICD-10-CM | POA: Diagnosis present

## 2015-01-01 DIAGNOSIS — I129 Hypertensive chronic kidney disease with stage 1 through stage 4 chronic kidney disease, or unspecified chronic kidney disease: Secondary | ICD-10-CM | POA: Diagnosis present

## 2015-01-01 DIAGNOSIS — Z8249 Family history of ischemic heart disease and other diseases of the circulatory system: Secondary | ICD-10-CM | POA: Diagnosis not present

## 2015-01-01 DIAGNOSIS — F329 Major depressive disorder, single episode, unspecified: Secondary | ICD-10-CM | POA: Diagnosis present

## 2015-01-01 DIAGNOSIS — I2119 ST elevation (STEMI) myocardial infarction involving other coronary artery of inferior wall: Secondary | ICD-10-CM

## 2015-01-01 DIAGNOSIS — I213 ST elevation (STEMI) myocardial infarction of unspecified site: Secondary | ICD-10-CM

## 2015-01-01 DIAGNOSIS — F419 Anxiety disorder, unspecified: Secondary | ICD-10-CM | POA: Diagnosis present

## 2015-01-01 DIAGNOSIS — R079 Chest pain, unspecified: Secondary | ICD-10-CM | POA: Diagnosis present

## 2015-01-01 DIAGNOSIS — E1122 Type 2 diabetes mellitus with diabetic chronic kidney disease: Secondary | ICD-10-CM | POA: Diagnosis present

## 2015-01-01 HISTORY — PX: CARDIAC CATHETERIZATION: SHX172

## 2015-01-01 SURGERY — LEFT HEART CATH AND CORONARY ANGIOGRAPHY
Anesthesia: LOCAL

## 2015-01-01 MED ORDER — FAMOTIDINE IN NACL 20-0.9 MG/50ML-% IV SOLN
INTRAVENOUS | Status: AC
  Filled 2015-01-01: qty 50

## 2015-01-01 MED ORDER — LIDOCAINE-EPINEPHRINE 1 %-1:100000 IJ SOLN
INTRAMUSCULAR | Status: AC
  Filled 2015-01-01: qty 1

## 2015-01-01 MED ORDER — TRAMADOL HCL 50 MG PO TABS: 50.0000 mg | ORAL_TABLET | Freq: Four times a day (QID) | ORAL | Status: DC | PRN

## 2015-01-01 MED ORDER — ATORVASTATIN CALCIUM 80 MG PO TABS
80.0000 mg | ORAL_TABLET | Freq: Every day | ORAL | Status: DC
Start: 2015-01-01 — End: 2015-01-03
  Administered 2015-01-02 (×2): 80 mg via ORAL
  Filled 2015-01-01 (×2): qty 1

## 2015-01-01 MED ORDER — ASPIRIN EC 81 MG PO TBEC: 81.0000 mg | DELAYED_RELEASE_TABLET | Freq: Every day | ORAL | Status: DC

## 2015-01-01 MED ORDER — SODIUM CHLORIDE 0.9 % IJ SOLN: 3.0000 mL | INTRAMUSCULAR | Status: DC | PRN

## 2015-01-01 MED ORDER — MIDAZOLAM HCL 2 MG/2ML IJ SOLN
INTRAMUSCULAR | Status: DC | PRN
  Administered 2015-01-01 (×2): 1 mg via INTRAVENOUS

## 2015-01-01 MED ORDER — TIROFIBAN HCL IV 5 MG/100ML
INTRAVENOUS | Status: DC | PRN
  Administered 2015-01-01: 0.15 ug/kg/min via INTRAVENOUS

## 2015-01-01 MED ORDER — TIROFIBAN HCL IV 5 MG/100ML: 0.1500 ug/kg/min | INTRAVENOUS | Status: AC

## 2015-01-01 MED ORDER — ONDANSETRON HCL 4 MG/2ML IJ SOLN: 4.0000 mg | Freq: Four times a day (QID) | INTRAMUSCULAR | Status: DC | PRN

## 2015-01-01 MED ORDER — MIRTAZAPINE 30 MG PO TABS
60.0000 mg | ORAL_TABLET | Freq: Every day | ORAL | Status: DC
  Administered 2015-01-02 (×2): 60 mg via ORAL
  Filled 2015-01-01 (×4): qty 2

## 2015-01-01 MED ORDER — SODIUM CHLORIDE 0.9 % IV SOLN
INTRAVENOUS | Status: AC
  Administered 2015-01-02 (×2): via INTRAVENOUS

## 2015-01-01 MED ORDER — TIROFIBAN (AGGRASTAT) BOLUS VIA INFUSION
INTRAVENOUS | Status: DC | PRN
  Administered 2015-01-01: 2325 ug via INTRAVENOUS

## 2015-01-01 MED ORDER — BIVALIRUDIN BOLUS VIA INFUSION - CUPID
INTRAVENOUS | Status: DC | PRN
  Administered 2015-01-01: 69.75 mg via INTRAVENOUS

## 2015-01-01 MED ORDER — LISINOPRIL 5 MG PO TABS
5.0000 mg | ORAL_TABLET | Freq: Every day | ORAL | Status: DC
  Administered 2015-01-02 – 2015-01-03 (×2): 5 mg via ORAL
  Filled 2015-01-01 (×2): qty 1

## 2015-01-01 MED ORDER — MIDAZOLAM HCL 2 MG/2ML IJ SOLN
INTRAMUSCULAR | Status: AC
  Filled 2015-01-01: qty 4

## 2015-01-01 MED ORDER — QUETIAPINE FUMARATE 100 MG PO TABS
100.0000 mg | ORAL_TABLET | Freq: Every day | ORAL | Status: DC
  Administered 2015-01-02: 100 mg via ORAL
  Filled 2015-01-01: qty 1

## 2015-01-01 MED ORDER — FENTANYL CITRATE (PF) 100 MCG/2ML IJ SOLN
INTRAMUSCULAR | Status: AC
  Filled 2015-01-01: qty 4

## 2015-01-01 MED ORDER — FENTANYL CITRATE (PF) 100 MCG/2ML IJ SOLN
INTRAMUSCULAR | Status: DC | PRN
  Administered 2015-01-01 (×2): 25 ug via INTRAVENOUS

## 2015-01-01 MED ORDER — PRASUGREL HCL 10 MG PO TABS
ORAL_TABLET | ORAL | Status: AC
  Filled 2015-01-01: qty 6

## 2015-01-01 MED ORDER — ATORVASTATIN CALCIUM 80 MG PO TABS: 80.0000 mg | ORAL_TABLET | Freq: Every day | ORAL | Status: DC

## 2015-01-01 MED ORDER — SODIUM CHLORIDE 0.9 % IV SOLN
250.0000 mg | INTRAVENOUS | Status: DC | PRN
  Administered 2015-01-01: 1.75 mg/kg/h via INTRAVENOUS

## 2015-01-01 MED ORDER — SODIUM CHLORIDE 0.9 % IV SOLN: 250.0000 mL | INTRAVENOUS | Status: DC | PRN

## 2015-01-01 MED ORDER — PRASUGREL HCL 10 MG PO TABS
60.0000 mg | ORAL_TABLET | Freq: Once | ORAL | Status: DC
  Filled 2015-01-01: qty 6

## 2015-01-01 MED ORDER — INSULIN ASPART 100 UNIT/ML ~~LOC~~ SOLN
0.0000 [IU] | Freq: Three times a day (TID) | SUBCUTANEOUS | Status: DC
  Administered 2015-01-02: 1 [IU] via SUBCUTANEOUS

## 2015-01-01 MED ORDER — TAMSULOSIN HCL 0.4 MG PO CAPS: 0.4000 mg | ORAL_CAPSULE | Freq: Every day | ORAL | Status: DC

## 2015-01-01 MED ORDER — ACETAMINOPHEN 325 MG PO TABS: 650.0000 mg | ORAL_TABLET | ORAL | Status: DC | PRN

## 2015-01-01 MED ORDER — BIVALIRUDIN 250 MG IV SOLR
INTRAVENOUS | Status: AC
  Filled 2015-01-01: qty 250

## 2015-01-01 MED ORDER — TIROFIBAN HCL IV 12.5 MG/250 ML
INTRAVENOUS | Status: AC
  Filled 2015-01-01: qty 250

## 2015-01-01 MED ORDER — HEPARIN SODIUM (PORCINE) 5000 UNIT/ML IJ SOLN
INTRAMUSCULAR | Status: AC
  Administered 2015-01-01: 4000 [IU]
  Filled 2015-01-01: qty 1

## 2015-01-01 MED ORDER — SODIUM CHLORIDE 0.9 % IJ SOLN
3.0000 mL | Freq: Two times a day (BID) | INTRAMUSCULAR | Status: DC
  Administered 2015-01-02 (×3): 3 mL via INTRAVENOUS

## 2015-01-01 MED ORDER — METOPROLOL TARTRATE 12.5 MG HALF TABLET
12.5000 mg | ORAL_TABLET | Freq: Two times a day (BID) | ORAL | Status: DC
Start: 2015-01-01 — End: 2015-01-03
  Administered 2015-01-02 – 2015-01-03 (×4): 12.5 mg via ORAL
  Filled 2015-01-01 (×4): qty 1

## 2015-01-01 MED ORDER — QUETIAPINE FUMARATE 300 MG PO TABS
300.0000 mg | ORAL_TABLET | Freq: Every day | ORAL | Status: DC
  Filled 2015-01-01: qty 1

## 2015-01-01 MED ORDER — NITROGLYCERIN IN D5W 200-5 MCG/ML-% IV SOLN
5.0000 ug/min | INTRAVENOUS | Status: DC
  Administered 2015-01-02: 5 ug/min via INTRAVENOUS

## 2015-01-01 MED ORDER — PANTOPRAZOLE SODIUM 40 MG PO TBEC
40.0000 mg | DELAYED_RELEASE_TABLET | Freq: Every day | ORAL | Status: DC
  Administered 2015-01-02 – 2015-01-03 (×2): 40 mg via ORAL
  Filled 2015-01-01 (×2): qty 1

## 2015-01-01 MED ORDER — ONDANSETRON HCL 4 MG/2ML IJ SOLN
4.0000 mg | Freq: Four times a day (QID) | INTRAMUSCULAR | Status: DC | PRN
Start: 2015-01-01 — End: 2015-01-01

## 2015-01-01 MED ORDER — PRASUGREL HCL 10 MG PO TABS
ORAL_TABLET | ORAL | Status: DC | PRN
  Administered 2015-01-01: 60 mg via ORAL

## 2015-01-01 MED ORDER — NITROGLYCERIN 0.4 MG SL SUBL: 0.4000 mg | SUBLINGUAL_TABLET | SUBLINGUAL | Status: DC | PRN

## 2015-01-01 MED ORDER — ONDANSETRON HCL 4 MG/2ML IJ SOLN
INTRAMUSCULAR | Status: DC | PRN
  Administered 2015-01-01: 4 mg via INTRAVENOUS

## 2015-01-01 MED ORDER — ASPIRIN 81 MG PO CHEW
81.0000 mg | CHEWABLE_TABLET | Freq: Every day | ORAL | Status: DC
  Administered 2015-01-02 – 2015-01-03 (×2): 81 mg via ORAL
  Filled 2015-01-01 (×2): qty 1

## 2015-01-01 MED ORDER — PRASUGREL HCL 10 MG PO TABS
10.0000 mg | ORAL_TABLET | Freq: Every day | ORAL | Status: DC
  Administered 2015-01-02 – 2015-01-03 (×2): 10 mg via ORAL
  Filled 2015-01-01 (×2): qty 1

## 2015-01-01 MED ORDER — ONDANSETRON HCL 4 MG/2ML IJ SOLN
INTRAMUSCULAR | Status: AC
  Filled 2015-01-01: qty 2

## 2015-01-01 MED ORDER — PRASUGREL HCL 10 MG PO TABS: 10.0000 mg | ORAL_TABLET | Freq: Every day | ORAL | Status: DC

## 2015-01-01 SURGICAL SUPPLY — 22 items
BALLN EMERGE MR 2.5X15 (BALLOONS) ×2
BALLN ~~LOC~~ EMERGE MR 3.25X20 (BALLOONS) ×2
BALLN ~~LOC~~ EMERGE MR 3.5X20 (BALLOONS) ×2
BALLOON EMERGE MR 2.5X15 (BALLOONS) IMPLANT
BALLOON ~~LOC~~ EMERGE MR 3.25X20 (BALLOONS) IMPLANT
BALLOON ~~LOC~~ EMERGE MR 3.5X20 (BALLOONS) IMPLANT
CATH INFINITI 5FR MULTPACK ANG (CATHETERS) ×2 IMPLANT
GUIDE CATH RUNWAY 6FR FR4 (CATHETERS) ×1 IMPLANT
GUIDE CATH RUNWAY 6FR FR4 SH (CATHETERS) ×1 IMPLANT
GUIDE CATH RUNWAY 6FR VL3.5 (CATHETERS) ×1 IMPLANT
KIT ENCORE 26 ADVANTAGE (KITS) ×1 IMPLANT
KIT HEART LEFT (KITS) ×2 IMPLANT
PACK CARDIAC CATHETERIZATION (CUSTOM PROCEDURE TRAY) ×2 IMPLANT
SHEATH PINNACLE 5F 10CM (SHEATH) ×2 IMPLANT
SHEATH PINNACLE 6F 10CM (SHEATH) ×1 IMPLANT
STENT XIENCE ALPINE RX 3.0X12 (Permanent Stent) ×1 IMPLANT
STENT XIENCE ALPINE RX 3.0X28 (Permanent Stent) ×1 IMPLANT
STENT XIENCE ALPINE RX 3.0X33 (Permanent Stent) ×1 IMPLANT
SYR MEDRAD MARK V 150ML (SYRINGE) ×2 IMPLANT
TRANSDUCER W/STOPCOCK (MISCELLANEOUS) ×2 IMPLANT
WIRE EMERALD 3MM-J .035X150CM (WIRE) ×2 IMPLANT
WIRE RUNTHROUGH .014X180CM (WIRE) ×1 IMPLANT

## 2015-01-01 NOTE — H&P (Signed)
Michael Schmitt is an 44 y.o. male.   Chief Complaint: Recurrent chest pain HPI: Patient is 44 year old male with past medical history significant for hypertension, diabetes mellitus, hyperlipidemia, tobacco abuse, positive family history of coronary artery disease, depression, anxiety disorder, degenerative joint disease, came to the ER by EMS as code STEMI was called. Patient complains of recurrent chest pain which started on Tuesday off and on called EMS on Tuesday had EKG done which was normal as per patient and was told to have acid reflux today as the chest pain got worst so decided to call EMS again chest pain radiated to the left arm and back associated with nausea EKG done on the field showed the sinus tach with ST elevation in inferior leads and ST depression in lead V1 to V3 and reciprocal ST depression in lead 1 aVL suggestive of acute inferoposterior wall injury. Patient was emergently brought to the Cath Lab. Patient denies any cocaine abuse denies any shortness of breath denies palpitation lightheadedness or syncope.  Past Medical History  Diagnosis Date  . Hypertension   . Mental disorder   . Anxiety   . Shortness of breath   . RLS (restless legs syndrome)     detected on sleep study  . GERD (gastroesophageal reflux disease)   . Headache(784.0)   . Blood dyscrasia     told that when he was young he was a" free bleeder"  . Arthritis     cerv. stenosis, spondylosis, HNP- lower back , has been followed in pain clinic, has  had injection s in cerv. area  . Chronic kidney disease     renal calculi- passed spontaneously  . Anxiety   . Depression   . Diabetes mellitus without complication ([place])   . Cervicogenic headache 07/24/2014  . Cervical spondylosis without myelopathy 07/24/2014  . Fatty liver     Past Surgical History  Procedure Laterality Date  . Nasal sinus surgery      2005  . Anterior cervical decomp/discectomy fusion  11/13/2011    Procedure: ANTERIOR CERVICAL  DECOMPRESSION/DISCECTOMY FUSION 1 LEVEL;  Surgeon: [name], MD;  Location: [place] [facility];  Service: Neurosurgery;  Laterality: N/A;  Anterior Cervical Decompression/discectomy Fusion. Cervical three-four.    Family History  Problem Relation Age of Onset  . Prostate cancer Father   . Hypertension Mother   . Kidney Stones Mother   . COPD Sister   . Hypertension Sister   . Diabetes Sister    Social History:  reports that he has been smoking.  He has never used smokeless tobacco. He reports that he drinks alcohol. He reports that he does not use illicit drugs.  Allergies:  Allergies  Allergen Reactions  . Hydrocodone     Headache, irritable    Medications Prior to Admission  Medication Sig Dispense Refill  . atenolol (TENORMIN) 25 MG tablet Take 25 mg by mouth daily before breakfast.     . clonazePAM (KLONOPIN) 1 MG tablet Take 1 mg by mouth 2 (two) times daily.    Marland Kitchen esomeprazole (NEXIUM) 40 MG capsule Take 40 mg by mouth daily at 12 noon.    Marland Kitchen glimepiride (AMARYL) 1 MG tablet Take 1 mg by mouth daily with breakfast.    . lisinopril (PRINIVIL,ZESTRIL) 5 MG tablet Take 5 mg by mouth daily.    . mirtazapine (REMERON) 30 MG tablet Take 60 mg by mouth at bedtime.    . pantoprazole (PROTONIX) 40 MG tablet Take 1 tablet (40 mg  total) by mouth daily. 30 tablet 1  . Probiotic Product (PROBIOTIC ADVANCED PO) Take by mouth.    . promethazine (PHENERGAN) 12.5 MG tablet Take 1 tablet (12.5 mg total) by mouth every 6 (six) hours as needed for nausea or vomiting. 30 tablet 0  . QUEtiapine (SEROQUEL) 300 MG tablet Take 300 mg by mouth at bedtime.    . tamsulosin (FLOMAX) 0.4 MG CAPS capsule Take 0.4 mg by mouth daily.    Marland Kitchen tiZANidine (ZANAFLEX) 2 MG tablet Take 2 tablets (4 mg total) by mouth at bedtime. 60 tablet 2  . traMADol (ULTRAM) 50 MG tablet TAKE 1 TABLET EVERY 6 HOURS AS NEEDED(MUST LAST 28 DAYS) 50 tablet 1    No results found for this or any previous visit (from the past 48  hour(s)). No results found.  Review of Systems  Respiratory: Negative for sputum production and shortness of breath.   Cardiovascular: Positive for chest pain. Negative for palpitations and leg swelling.  Gastrointestinal: Positive for nausea and abdominal pain.  Genitourinary: Negative for dysuria.  Neurological: Negative for dizziness and headaches.    Blood pressure 148/90, pulse 91, temperature 97.5 F (36.4 C), temperature source Oral, resp. rate 20, SpO2 100 %. Physical Exam  Constitutional: He is oriented to person, place, and time.  HENT:  Head: Atraumatic.  Eyes: Conjunctivae are normal. Pupils are equal, round, and reactive to light. Left eye exhibits no discharge. No scleral icterus.  Neck: Normal range of motion. Neck supple. No tracheal deviation present. No thyromegaly present.  Cardiovascular: Normal rate and regular rhythm.   Murmur (Soft systolic murmur and S4 gallop noted) heard. Respiratory: Effort normal and breath sounds normal. No respiratory distress. He has no wheezes. He has no rales.  GI: Soft. Bowel sounds are normal. He exhibits no distension. There is no tenderness.  Musculoskeletal: He exhibits no edema or tenderness.  Neurological: He is alert and oriented to person, place, and time.     Assessment/Plan Acute inferoposterior wall cardial infarction Hypertension Diabetes mellitus Tobacco abuse Strong family history of coronary artery disease Depression Anxiety disorder Degenerative joint disease Plan Discussed with patient briefly regarding emergency left cath possible PTCA stenting its risk and benefits i.e. death MI stroke need for emergency CABG local vascular complications etc. and consents for PCI  [name] 01/01/2015, 11:05 PM

## 2015-01-01 NOTE — ED Notes (Signed)
From home via [facility], sudden onset central CP radiating to back, left arm numbness, no other symptoms, VSS, HTN, 18g LAC, A/O X4  3 nitros 324 ASA 50 mcg Fentanyl

## 2015-01-01 NOTE — ED Provider Notes (Signed)
CSN: [id number]     Arrival date & time 01/01/15  2119 History   None    Chief Complaint  Patient presents with  . Code STEMI   Patient is a 44 y.o. male presenting with chest pain.  Chest Pain Pain location:  Epigastric Pain quality: aching and pressure   Pain radiates to:  Mid back and L arm Pain radiates to the back: no   Pain severity:  Moderate Onset quality:  Sudden Timing:  Constant Progression:  Improving Chronicity:  Recurrent Context: eating   Relieved by:  Nothing Associated symptoms: abdominal pain, nausea and shortness of breath   Associated symptoms: no cough, no diaphoresis, no fatigue, no headache, no near-syncope, not vomiting and no weakness   Risk factors: diabetes mellitus, high cholesterol, hypertension, male sex, obesity and smoking     Past Medical History  Diagnosis Date  . Hypertension   . Mental disorder   . Anxiety   . Shortness of breath   . RLS (restless legs syndrome)     detected on sleep study  . GERD (gastroesophageal reflux disease)   . Headache(784.0)   . Blood dyscrasia     told that when he was young he was a" free bleeder"  . Arthritis     cerv. stenosis, spondylosis, HNP- lower back , has been followed in pain clinic, has  had injection s in cerv. area  . Chronic kidney disease     renal calculi- passed spontaneously  . Anxiety   . Depression   . Diabetes mellitus without complication ([place])   . Cervicogenic headache 07/24/2014  . Cervical spondylosis without myelopathy 07/24/2014  . Fatty liver    Past Surgical History  Procedure Laterality Date  . Nasal sinus surgery      2005  . Anterior cervical decomp/discectomy fusion  11/13/2011    Procedure: ANTERIOR CERVICAL DECOMPRESSION/DISCECTOMY FUSION 1 LEVEL;  Surgeon: [name], MD;  Location: [place] [facility];  Service: Neurosurgery;  Laterality: N/A;  Anterior Cervical Decompression/discectomy Fusion. Cervical three-four.   Family History  Problem Relation Age of Onset  .  Prostate cancer Father   . Hypertension Mother   . Kidney Stones Mother   . COPD Sister   . Hypertension Sister   . Diabetes Sister    Social History  Substance Use Topics  . Smoking status: Current Every Day Smoker -- 1.50 packs/day for 17 years  . Smokeless tobacco: Never Used  . Alcohol Use: Yes     Comment: beer- 1-2 x's per month     Review of Systems  Constitutional: Negative for diaphoresis and fatigue.  Respiratory: Positive for shortness of breath. Negative for cough.   Cardiovascular: Positive for chest pain. Negative for near-syncope.  Gastrointestinal: Positive for nausea and abdominal pain. Negative for vomiting.  Neurological: Negative for weakness and headaches.  All other systems reviewed and are negative.     Allergies  Hydrocodone  Home Medications   Prior to Admission medications   Medication Sig Start Date End Date Taking? Authorizing Provider  atenolol (TENORMIN) 25 MG tablet Take 25 mg by mouth daily before breakfast.     Historical Provider, MD  clonazePAM (KLONOPIN) 1 MG tablet Take 1 mg by mouth 2 (two) times daily.    Historical Provider, MD  esomeprazole (NEXIUM) 40 MG capsule Take 40 mg by mouth daily at 12 noon.    Historical Provider, MD  glimepiride (AMARYL) 1 MG tablet Take 1 mg by mouth daily with breakfast.  Historical Provider, MD  lisinopril (PRINIVIL,ZESTRIL) 5 MG tablet Take 5 mg by mouth daily.    Historical Provider, MD  mirtazapine (REMERON) 30 MG tablet Take 60 mg by mouth at bedtime.    Historical Provider, MD  pantoprazole (PROTONIX) 40 MG tablet Take 1 tablet (40 mg total) by mouth daily. 09/03/14 09/03/15  Harvest Dark, MD  Probiotic Product (PROBIOTIC ADVANCED PO) Take by mouth.    Historical Provider, MD  promethazine (PHENERGAN) 12.5 MG tablet Take 1 tablet (12.5 mg total) by mouth every 6 (six) hours as needed for nausea or vomiting. 09/03/14   Harvest Dark, MD  QUEtiapine (SEROQUEL) 300 MG tablet Take 300 mg by mouth  at bedtime.    Historical Provider, MD  tamsulosin (FLOMAX) 0.4 MG CAPS capsule Take 0.4 mg by mouth daily.    Historical Provider, MD  tiZANidine (ZANAFLEX) 2 MG tablet Take 2 tablets (4 mg total) by mouth at bedtime. 08/19/14   Kathrynn Ducking, MD  traMADol (ULTRAM) 50 MG tablet TAKE 1 TABLET EVERY 6 HOURS AS NEEDED(MUST LAST 28 DAYS) 10/12/14   Kathrynn Ducking, MD   BP 148/90 mmHg  Pulse 91  Temp(Src) 97.5 F (36.4 C) (Oral)  Resp 20  SpO2 100% Physical Exam  Constitutional: He is oriented to person, place, and time. He appears well-developed and well-nourished. No distress.  HENT:  Head: Normocephalic and atraumatic.  Eyes: Pupils are equal, round, and reactive to light.  Neck: Normal range of motion. No JVD present.  Cardiovascular: Normal rate.   Pulmonary/Chest: Effort normal. No respiratory distress.  Abdominal: Soft. He exhibits no distension.  Musculoskeletal: Normal range of motion. He exhibits no edema.  Neurological: He is alert and oriented to person, place, and time. No cranial nerve deficit.  Skin: Skin is warm and dry. He is not diaphoretic. No erythema.  Psychiatric: He has a normal mood and affect. His behavior is normal. Judgment and thought content normal.  Nursing note and vitals reviewed.   ED Course  Procedures (including critical care time) Labs Review Labs Reviewed  MRSA PCR SCREENING  COMPREHENSIVE METABOLIC PANEL  TSH  T4, FREE  TROPONIN I  TROPONIN I  TROPONIN I  HEMOGLOBIN A1C  CBC WITH DIFFERENTIAL/PLATELET  PROTIME-INR  LIPID PANEL  CBC  BASIC METABOLIC PANEL  HEMOGLOBIN A1C    MDM   Patient seen as code STEMI from field. EKG shows inferior elevation with reciprocal changes in anteroseptal leads. Patient given ASA in field and 3 nitroglycerins. Patient has improvement in pain but slight abdominal pain. Patient anticoagulated with heparin and prasugrel in ED. Pads placed and patient disrobed. Patient sent to [facility] lab with cardiology.    Final diagnoses:  ST elevation myocardial infarction (STEMI), unspecified artery (HCC)  Acute MI, inferoposterior wall [facility])      Roberto Scales, MD 01/02/15 0028  Elnora Morrison, MD 01/02/15 [id number]

## 2015-01-01 NOTE — H&P (Signed)
Patient ID: Michael Schmitt MRN: [id number], DOB/AGE: [birth year]   Admit date: 01/01/2015   Primary Physician: [name], MD Primary Cardiologist: None  Pt. Profile:  Pt is a 44 yo M w/ a PMH significant for HL, HTN, DMII, and smoking and FHx of early MI p/w STEMI.  Problem List  Past Medical History  Diagnosis Date  . Hypertension   . Mental disorder   . Anxiety   . Shortness of breath   . RLS (restless legs syndrome)     detected on sleep study  . GERD (gastroesophageal reflux disease)   . Headache(784.0)   . Blood dyscrasia     told that when he was young he was a" free bleeder"  . Arthritis     cerv. stenosis, spondylosis, HNP- lower back , has been followed in pain clinic, has  had injection s in cerv. area  . Chronic kidney disease     renal calculi- passed spontaneously  . Anxiety   . Depression   . Diabetes mellitus without complication ([place])   . Cervicogenic headache 07/24/2014  . Cervical spondylosis without myelopathy 07/24/2014  . Fatty liver     Past Surgical History  Procedure Laterality Date  . Nasal sinus surgery      2005  . Anterior cervical decomp/discectomy fusion  11/13/2011    Procedure: ANTERIOR CERVICAL DECOMPRESSION/DISCECTOMY FUSION 1 LEVEL;  Surgeon: [name], MD;  Location: [facility];  Service: Neurosurgery;  Laterality: N/A;  Anterior Cervical Decompression/discectomy Fusion. Cervical three-four.    Allergies  Allergies  Allergen Reactions  . Hydrocodone     Headache, irritable   HPI  CP symptoms have been going on since Wed, 10/5 on and off. Started today again sometime in the evening. Chest and stomach bothering him. Felt very nauseated. Pain radiated to neck and L arm. SOB and diaphoretic. Called EMS. Improved by arrival. Initial EKG showed inferolateral ST elevations and depressions in R precordial leads. Cath shows 99% lesion pLAD and pRCA lesion as well as ~90% in large branching OM. Hazy appearance in LAD suggests  thrombus and culprit lesion. PCI performed to LAD and RCA. Admit to CCU.   Home Medications  Prior to Admission medications   Medication Sig Start Date End Date Taking? Authorizing Provider  atenolol (TENORMIN) 25 MG tablet Take 25 mg by mouth daily before breakfast.     Historical Provider, MD  clonazePAM (KLONOPIN) 1 MG tablet Take 1 mg by mouth 2 (two) times daily.    Historical Provider, MD  esomeprazole (NEXIUM) 40 MG capsule Take 40 mg by mouth daily at 12 noon.    Historical Provider, MD  glimepiride (AMARYL) 1 MG tablet Take 1 mg by mouth daily with breakfast.    Historical Provider, MD  lisinopril (PRINIVIL,ZESTRIL) 5 MG tablet Take 5 mg by mouth daily.    Historical Provider, MD  mirtazapine (REMERON) 30 MG tablet Take 60 mg by mouth at bedtime.    Historical Provider, MD  pantoprazole (PROTONIX) 40 MG tablet Take 1 tablet (40 mg total) by mouth daily. 09/03/14 09/03/15  Harvest Dark, MD  Probiotic Product (PROBIOTIC ADVANCED PO) Take by mouth.    Historical Provider, MD  promethazine (PHENERGAN) 12.5 MG tablet Take 1 tablet (12.5 mg total) by mouth every 6 (six) hours as needed for nausea or vomiting. 09/03/14   Harvest Dark, MD  QUEtiapine (SEROQUEL) 300 MG tablet Take 300 mg by mouth at bedtime.    Historical Provider, MD  tamsulosin (FLOMAX) 0.4 MG CAPS capsule Take 0.4 mg by mouth daily.    Historical Provider, MD  tiZANidine (ZANAFLEX) 2 MG tablet Take 2 tablets (4 mg total) by mouth at bedtime. 08/19/14   Kathrynn Ducking, MD  traMADol (ULTRAM) 50 MG tablet TAKE 1 TABLET EVERY 6 HOURS AS NEEDED(MUST LAST 28 DAYS) 10/12/14   Kathrynn Ducking, MD   Family History  Family History  Problem Relation Age of Onset  . Prostate cancer Father   . Hypertension Mother   . Kidney Stones Mother   . COPD Sister   . Hypertension Sister   . Diabetes Sister    Social History  Social History   Social History  . Marital Status: Single    Spouse Name: N/A  . Number of Children: 3    . Years of Education: 12   Occupational History  .      unemployed   Social History Main Topics  . Smoking status: Current Every Day Smoker -- 1.50 packs/day for 17 years  . Smokeless tobacco: Never Used  . Alcohol Use: Yes     Comment: beer- 1-2 x's per month   . Drug Use: No  . Sexual Activity: Not on file   Other Topics Concern  . Not on file   Social History Narrative   Patient is right handed.   Patient drinks 2 sodas daily.    Review of Systems General:  No chills, fever, night sweats or weight changes.  Cardiovascular:  No chest pain, dyspnea on exertion, edema, orthopnea, palpitations, paroxysmal nocturnal dyspnea. Dermatological: No rash, lesions/masses Respiratory: No cough, dyspnea Urologic: No hematuria, dysuria Abdominal:   No nausea, vomiting, diarrhea, bright red blood per rectum, melena, or hematemesis Neurologic:  No visual changes, wkns, changes in mental status. All other systems reviewed and are otherwise negative except as noted above.  Physical Exam  Blood pressure 148/90, pulse 91, temperature 97.5 F (36.4 C), temperature source Oral, resp. rate 20, SpO2 100 %.  General: Pleasant, NAD Psych: Normal affect. Neuro: Alert and oriented X 3. Moves all extremities spontaneously. HEENT: Normal  Neck: Supple without bruits or JVD. Lungs:  Resp regular and unlabored, CTA. Heart: RRR no s3, s4, or murmurs. Abdomen: Soft, non-tender, non-distended, BS + x 4.  Extremities: No clubbing, cyanosis or edema. DP/PT/Radials 2+ and equal bilaterally.  Labs  Troponin (Point of Care Test) No results for input(s): TROPIPOC in the last 72 hours. No results for input(s): CKTOTAL, CKMB, TROPONINI in the last 72 hours. Lab Results  Component Value Date   WBC 9.9 09/03/2014   HGB 16.2 09/03/2014   HCT 47.6 09/03/2014   MCV 93.5 09/03/2014   PLT 241 09/03/2014   No results for input(s): NA, K, CL, CO2, BUN, CREATININE, CALCIUM, PROT, BILITOT, ALKPHOS, ALT, AST,  GLUCOSE in the last 168 hours.  Invalid input(s): LABALBU No results found for: CHOL, HDL, LDLCALC, TRIG No results found for: DDIMER   Radiology/Studies  No results found.  ECG - NSR, inferolateral STE, R precordial STD, no pathologic Q waves or early R in precordial leads  ASSESSMENT AND PLAN Pt is a 44 yo M w/ a PMH significant for HL, HTN, DMII, and smoking and FHx of early MI p/w STEMI.  #STEMI: 3vCAD in setting of DMII. Likely LAD is culprit lesion. Plan per cath attending is PCI of LAD and RCA. Could stage PCI and address OM lesion per PRAMI/CULPRIT trial. Will need to assess risk factors and aggressive lifestyle  modification/medications. -admit to tele -check lipid panel, HgbA1c, TFTs -echo in AM to assess EF -continue ASA and prasugrel -start atorvastatin 80 mg PO q hs -start metoprolol 12.5 mg PO bid, lisinopril 5 mg PO q d -refer for smoking cessation and phase I cardiac rehab  #IDDM -check HgbA1c -SSI  #FEN -IVF PRN -replete lytes PRN -cardiac/diabetic diet  #PPx -early ambulation -PPI  #Dispo  -pending w/u and eval  Signed, Bari Edward, MD 01/01/2015, 11:00 PM

## 2015-01-01 NOTE — Progress Notes (Signed)
   01/01/15 2135  Clinical Encounter Type  Visited With Health care provider  Visit Type Initial;Code  Referral From Nurse   Chaplain responded to a Code STEMI in the ED, and there was no family present. Chaplain support available as needed.   Jeri Lager, Chaplain 01/01/2015 9:36 PM

## 2015-01-01 NOTE — Progress Notes (Signed)
   01/01/15 2211  Clinical Encounter Type  Visited With Family;Health care provider  Visit Type Follow-up;Patient in surgery  Referral From Nurse   Chaplain helped escort family from ED waiting area to Cath Lab waiting and facilitated medical update. Chaplain support available as needed.   Jeri Lager, Chaplain 01/01/2015 10:12 PM

## 2015-01-02 ENCOUNTER — Inpatient Hospital Stay (HOSPITAL_COMMUNITY): Payer: Medicaid Other

## 2015-01-02 ENCOUNTER — Encounter (HOSPITAL_COMMUNITY): Payer: Self-pay | Admitting: *Deleted

## 2015-01-02 DIAGNOSIS — R079 Chest pain, unspecified: Secondary | ICD-10-CM

## 2015-01-02 LAB — TROPONIN I
TROPONIN I: 3.34 ng/mL — AB (ref <0.031)
TROPONIN I: 4.79 ng/mL — AB (ref <0.031)
Troponin I: 1.86 ng/mL (ref <0.031)
Troponin I: 5.13 ng/mL (ref <0.031)
Troponin I: 4.67 ng/mL (ref <0.031)
Troponin I: 4.35 ng/mL (ref <0.031)

## 2015-01-02 LAB — POCT ACTIVATED CLOTTING TIME: Activated Clotting Time: 134 seconds

## 2015-01-02 LAB — T4, FREE: Free T4: 0.74 ng/dL (ref 0.61–1.12)

## 2015-01-02 LAB — CBC WITH DIFFERENTIAL/PLATELET
BASOS PCT: 0 %
Basophils Absolute: 0 10*3/uL (ref 0.0–0.1)
EOS ABS: 0 10*3/uL (ref 0.0–0.7)
EOS PCT: 0 %
HCT: 40.1 % (ref 39.0–52.0)
Hemoglobin: 13.3 g/dL (ref 13.0–17.0)
Lymphocytes Relative: 24 %
Lymphs Abs: 2.3 10*3/uL (ref 0.7–4.0)
MCH: 30.2 pg (ref 26.0–34.0)
MCHC: 33.2 g/dL (ref 30.0–36.0)
MCV: 90.9 fL (ref 78.0–100.0)
MONO ABS: 0.4 10*3/uL (ref 0.1–1.0)
MONOS PCT: 4 %
NEUTROS PCT: 72 %
Neutro Abs: 6.7 10*3/uL (ref 1.7–7.7)
PLATELETS: 208 10*3/uL (ref 150–400)
RBC: 4.41 MIL/uL (ref 4.22–5.81)
RDW: 13.8 % (ref 11.5–15.5)
WBC: 9.4 10*3/uL (ref 4.0–10.5)

## 2015-01-02 LAB — COMPREHENSIVE METABOLIC PANEL
ALK PHOS: 89 U/L (ref 38–126)
ALT: 20 U/L (ref 17–63)
AST: 28 U/L (ref 15–41)
Albumin: 3.2 g/dL — ABNORMAL LOW (ref 3.5–5.0)
Anion gap: 7 (ref 5–15)
BUN: 8 mg/dL (ref 6–20)
CALCIUM: 8.3 mg/dL — AB (ref 8.9–10.3)
CHLORIDE: 102 mmol/L (ref 101–111)
CO2: 25 mmol/L (ref 22–32)
CREATININE: 0.87 mg/dL (ref 0.61–1.24)
Glucose, Bld: 102 mg/dL — ABNORMAL HIGH (ref 65–99)
Potassium: 4.2 mmol/L (ref 3.5–5.1)
Sodium: 134 mmol/L — ABNORMAL LOW (ref 135–145)
Total Bilirubin: 0.2 mg/dL — ABNORMAL LOW (ref 0.3–1.2)
Total Protein: 5.8 g/dL — ABNORMAL LOW (ref 6.5–8.1)

## 2015-01-02 LAB — BASIC METABOLIC PANEL
ANION GAP: 9 (ref 5–15)
ANION GAP: 8 (ref 5–15)
BUN: 7 mg/dL (ref 6–20)
BUN: 8 mg/dL (ref 6–20)
CALCIUM: 9 mg/dL (ref 8.9–10.3)
CO2: 27 mmol/L (ref 22–32)
CO2: 26 mmol/L (ref 22–32)
Calcium: 8.7 mg/dL — ABNORMAL LOW (ref 8.9–10.3)
Chloride: 103 mmol/L (ref 101–111)
Chloride: 107 mmol/L (ref 101–111)
Creatinine, Ser: 0.97 mg/dL (ref 0.61–1.24)
Creatinine, Ser: 0.85 mg/dL (ref 0.61–1.24)
GFR calc Af Amer: >60 mL/min (ref >60)
GFR calc Af Amer: >60 mL/min (ref >60)
GFR calc non Af Amer: >60 mL/min (ref >60)
GLUCOSE: 144 mg/dL — AB (ref 65–99)
GLUCOSE: 85 mg/dL (ref 65–99)
POTASSIUM: 4.3 mmol/L (ref 3.5–5.1)
POTASSIUM: 3.9 mmol/L (ref 3.5–5.1)
Sodium: 139 mmol/L (ref 135–145)
Sodium: 141 mmol/L (ref 135–145)

## 2015-01-02 LAB — CBC
HEMATOCRIT: 45.3 % (ref 39.0–52.0)
HEMATOCRIT: 40.2 % (ref 39.0–52.0)
HEMOGLOBIN: 14.6 g/dL (ref 13.0–17.0)
Hemoglobin: 13.4 g/dL (ref 13.0–17.0)
MCH: 30 pg (ref 26.0–34.0)
MCH: 30.6 pg (ref 26.0–34.0)
MCHC: 32.2 g/dL (ref 30.0–36.0)
MCHC: 33.3 g/dL (ref 30.0–36.0)
MCV: 93.2 fL (ref 78.0–100.0)
MCV: 91.8 fL (ref 78.0–100.0)
Platelets: 199 10*3/uL (ref 150–400)
Platelets: 206 10*3/uL (ref 150–400)
RBC: 4.86 MIL/uL (ref 4.22–5.81)
RBC: 4.38 MIL/uL (ref 4.22–5.81)
RDW: 14 % (ref 11.5–15.5)
RDW: 14 % (ref 11.5–15.5)
WBC: 8.2 10*3/uL (ref 4.0–10.5)
WBC: 9.8 10*3/uL (ref 4.0–10.5)

## 2015-01-02 LAB — GLUCOSE, CAPILLARY
GLUCOSE-CAPILLARY: 83 mg/dL (ref 65–99)
GLUCOSE-CAPILLARY: 93 mg/dL (ref 65–99)
Glucose-Capillary: 133 mg/dL — ABNORMAL HIGH (ref 65–99)
Glucose-Capillary: 109 mg/dL — ABNORMAL HIGH (ref 65–99)
Glucose-Capillary: 103 mg/dL — ABNORMAL HIGH (ref 65–99)

## 2015-01-02 LAB — PROTIME-INR
INR: 1.44 (ref 0.00–1.49)
PROTHROMBIN TIME: 17.6 s — AB (ref 11.6–15.2)

## 2015-01-02 LAB — LIPID PANEL
Cholesterol: 184 mg/dL (ref 0–200)
HDL: 28 mg/dL — ABNORMAL LOW (ref >40)
LDL Cholesterol: 92 mg/dL (ref 0–99)
Total CHOL/HDL Ratio: 6.6 RATIO
Triglycerides: 321 mg/dL — ABNORMAL HIGH (ref <150)
VLDL: 64 mg/dL — ABNORMAL HIGH (ref 0–40)

## 2015-01-02 LAB — TSH: TSH: 0.98 u[IU]/mL (ref 0.350–4.500)

## 2015-01-02 LAB — MRSA PCR SCREENING: MRSA by PCR: NEGATIVE

## 2015-01-02 MED ORDER — PNEUMOCOCCAL VAC POLYVALENT 25 MCG/0.5ML IJ INJ
0.5000 mL | INJECTION | INTRAMUSCULAR | Status: AC
  Administered 2015-01-03: 0.5 mL via INTRAMUSCULAR
  Filled 2015-01-02: qty 0.5

## 2015-01-02 MED ORDER — INFLUENZA VAC SPLIT QUAD 0.5 ML IM SUSY
0.5000 mL | PREFILLED_SYRINGE | INTRAMUSCULAR | Status: AC
  Administered 2015-01-03: 0.5 mL via INTRAMUSCULAR
  Filled 2015-01-02 (×2): qty 0.5

## 2015-01-02 MED ORDER — OXYCODONE-ACETAMINOPHEN 5-325 MG PO TABS
1.0000 | ORAL_TABLET | Freq: Three times a day (TID) | ORAL | Status: DC | PRN
  Administered 2015-01-02 – 2015-01-03 (×2): 2 via ORAL
  Filled 2015-01-02 (×4): qty 2

## 2015-01-02 MED ORDER — ZOLPIDEM TARTRATE 5 MG PO TABS
5.0000 mg | ORAL_TABLET | Freq: Every evening | ORAL | Status: DC | PRN
Start: 2015-01-02 — End: 2015-01-03
  Administered 2015-01-02: 5 mg via ORAL
  Filled 2015-01-02: qty 1

## 2015-01-02 MED ORDER — PANTOPRAZOLE SODIUM 40 MG IV SOLR
40.0000 mg | Freq: Once | INTRAVENOUS | Status: AC
  Administered 2015-01-02: 40 mg via INTRAVENOUS
  Filled 2015-01-02: qty 40

## 2015-01-02 NOTE — Progress Notes (Signed)
Sheath pulled at 0510 from right groin. Pressure held for 20 mins. Pt vitals remained stable. Pt tolerated well. Instructed pt on site care when coughing sneezing etc. Also informed pt of keeping right leg straight. Pt demonstrated understanding. Will continue to monitor.

## 2015-01-02 NOTE — Progress Notes (Signed)
Utilization review completed.  

## 2015-01-02 NOTE — Progress Notes (Signed)
CARDIAC REHAB PHASE I   PRE:  Rate/Rhythm: 72  BP:  Sitting: 138/98     SaO2: 98  MODE:  Ambulation: 150 ft   POST:  Rate/Rhythm: 77  BP:  Sitting: 160/93     SaO2: 99  1:13pm-1:45pm  Patient ambulated at steady pace independently. Patient did not complain of pain--patient stated that it felt good to get up out of bed. Patient placed in chair with feet elevated and call bell in reach.   Earline Stiner, [place], [place] 01/02/2015 1:43 PM

## 2015-01-02 NOTE — Plan of Care (Signed)
Problem: Consults Goal: Tobacco Cessation referral if indicated Outcome: Completed/Met Date Met:  01/02/15 Encouraged to stop smoking

## 2015-01-02 NOTE — Progress Notes (Signed)
[facility] Progress Note Patient Name: Michael Schmitt DOB: [birth year] MRN: [id number]   Date of Service  01/02/2015  HPI/Events of Note  55 M with MMP including DM/CKD/HTN admitted with STEMI to cath lab where he was found to have multiple vessel disease requiring PTCA and stenting times 3.  He is HD stable alert and in NAD.  eICU Interventions  Plan of care per primary cardiology team Continue to monitor via [facility]     Intervention Category Evaluation Type: New Patient Evaluation  DETERDING,ELIZABETH 01/02/2015, 12:59 AM

## 2015-01-02 NOTE — Progress Notes (Signed)
  Echocardiogram 2D Echocardiogram has been performed.  Aggie Cosier 01/02/2015, 2:31 PM

## 2015-01-02 NOTE — Progress Notes (Signed)
Subjective:  Doing well denies any chest pain or shortness of breath. Tolerated multivessel PCI well with excellent angiographic results.  Objective:  Vital Signs in the last 24 hours: Temp:  [97.5 F (36.4 C)-98.5 F (36.9 C)] 98.4 F (36.9 C) (10/08 1100) Pulse Rate:  [0-128] 71 (10/08 1100) Resp:  [0-33] 12 (10/08 1100) BP: (108-157)/(64-104) 127/73 mmHg (10/08 1100) SpO2:  [0 %-100 %] 98 % (10/08 1100) Arterial Line BP: (130-166)/(75-95) 134/76 mmHg (10/08 0500) Weight:  [93.2 kg (205 lb 7.5 oz)] 93.2 kg (205 lb 7.5 oz) (10/07 2349)  Intake/Output from previous day: 10/07 0701 - 10/08 0700 In: 1192.9 [I.V.:1192.9] Out: 1150 [Urine:1150] Intake/Output from this shift: Total I/O In: 807.3 [P.O.:340; I.V.:467.3] Out: 950 [Urine:950]  Physical Exam: Neck: no adenopathy, no carotid bruit, no JVD and supple, symmetrical, trachea midline Lungs: clear to auscultation bilaterally Heart: regular rate and rhythm, S1, S2 normal and Soft systolic murmur noted Abdomen: soft, non-tender; bowel sounds normal; no masses,  no organomegaly Extremities: extremities normal, atraumatic, no cyanosis or edema  Lab Results:  Recent Labs  01/02/15 0226 01/02/15 0518  WBC 8.2 9.8  HGB 14.6 13.4  PLT 199 206    Recent Labs  01/02/15 0226 01/02/15 0518  NA 139 141  K 4.3 3.9  CL 103 107  CO2 27 26  GLUCOSE 144* 85  BUN 7 8  CREATININE 0.97 0.85    Recent Labs  01/02/15 0708 01/02/15 1040  TROPONINI 5.13* 4.67*   Hepatic Function Panel  Recent Labs  01/02/15 0010  PROT 5.8*  ALBUMIN 3.2*  AST 28  ALT 20  ALKPHOS 89  BILITOT 0.2*    Recent Labs  01/02/15 0010  CHOL 184   No results for input(s): PROTIME in the last 72 hours.  Imaging: Imaging results have been reviewed and No results found.  Cardiac Studies:  Assessment/Plan:  Status post acute inferoposterior wall myocardial infarction status post PCI to RCA/obtuse marginal/LAD with excellent angiographic  result Multivessel CAD Hypertension Diabetes mellitus Tobacco abuse Strong family history of coronary artery disease Depression Anxiety disorder Degenerative joint disease plan Plan Start low-dose ACE inhibitor as per orders Check labs in a.m. Okay to transfer to 2 heart stepdown unit.  LOS: 1 day    Michael Schmitt 01/02/2015, 1:00 PM

## 2015-01-03 LAB — POCT ACTIVATED CLOTTING TIME: Activated Clotting Time: 460 seconds

## 2015-01-03 LAB — GLUCOSE, CAPILLARY: GLUCOSE-CAPILLARY: 121 mg/dL — AB (ref 65–99)

## 2015-01-03 LAB — CBC
HEMATOCRIT: 42.9 % (ref 39.0–52.0)
HEMOGLOBIN: 14.1 g/dL (ref 13.0–17.0)
MCH: 30.5 pg (ref 26.0–34.0)
MCHC: 32.9 g/dL (ref 30.0–36.0)
MCV: 92.7 fL (ref 78.0–100.0)
Platelets: 200 10*3/uL (ref 150–400)
RBC: 4.63 MIL/uL (ref 4.22–5.81)
RDW: 14.2 % (ref 11.5–15.5)
WBC: 9.9 10*3/uL (ref 4.0–10.5)

## 2015-01-03 LAB — BASIC METABOLIC PANEL
Anion gap: 12 (ref 5–15)
BUN: 9 mg/dL (ref 6–20)
CHLORIDE: 103 mmol/L (ref 101–111)
CO2: 24 mmol/L (ref 22–32)
CREATININE: 1.08 mg/dL (ref 0.61–1.24)
Calcium: 9.1 mg/dL (ref 8.9–10.3)
GFR calc non Af Amer: >60 mL/min (ref >60)
Glucose, Bld: 121 mg/dL — ABNORMAL HIGH (ref 65–99)
Potassium: 4.2 mmol/L (ref 3.5–5.1)
Sodium: 139 mmol/L (ref 135–145)

## 2015-01-03 LAB — TROPONIN I: Troponin I: 3.17 ng/mL (ref <0.031)

## 2015-01-03 MED ORDER — PRASUGREL HCL 10 MG PO TABS
10.0000 mg | ORAL_TABLET | Freq: Every day | ORAL | Status: DC
Start: 2015-01-03 — End: 2015-06-21

## 2015-01-03 MED ORDER — ATORVASTATIN CALCIUM 80 MG PO TABS: 80.0000 mg | ORAL_TABLET | Freq: Every day | ORAL | Status: DC

## 2015-01-03 MED ORDER — METOPROLOL TARTRATE 25 MG PO TABS: 12.5000 mg | ORAL_TABLET | Freq: Two times a day (BID) | ORAL | Status: DC

## 2015-01-03 MED ORDER — ASPIRIN 81 MG PO CHEW: 81.0000 mg | CHEWABLE_TABLET | Freq: Every day | ORAL | Status: DC

## 2015-01-03 MED ORDER — NITROGLYCERIN 0.4 MG SL SUBL: 0.4000 mg | SUBLINGUAL_TABLET | SUBLINGUAL | Status: DC | PRN

## 2015-01-03 NOTE — Discharge Summary (Signed)
NAMECLEMENCE, Michael Schmitt             ACCOUNT NO.:  [id number]  MEDICAL RECORD NO.:  [id number]  LOCATION:  2H20C                        FACILITY:  [facility]  PHYSICIAN:  Jobany Montellano N. Terrence Dupont, M.D. DATE OF BIRTH:  03/08/1971  DATE OF ADMISSION:  01/01/2015 DATE OF DISCHARGE:  01/03/2015                              DISCHARGE SUMMARY   ADMITTING DIAGNOSES: 1. Acute inferoposterior wall myocardial infarction. 2. Hypertension. 3. Diabetes mellitus. 4. Tobacco abuse. 5. Strong family history of coronary artery disease. 6. Depression. 7. Anxiety disorder. 8. Degenerative joint disease.  DISCHARGE DIAGNOSES: 1. Status post acute inferoposterior wall myocardial infarction,     status post percutaneous transluminal coronary angioplasty (PTCA)     stenting to right coronary artery (RCA)/obtuse marginal/left     anterior descending (LAD). 2. Multivessel coronary artery disease (CAD). 3. Hypertension. 4. Diabetes mellitus. 5. Tobacco abuse. 6. Strong family history of coronary artery disease. 7. Depression. 8. Anxiety disorder. 9. Degenerative joint disease. 10.Chronic pain syndrome.  DISCHARGE HOME MEDICATIONS: 1. Aspirin 81 mg 1 tablet daily. 2. Atorvastatin 80 mg 1 tablet daily. 3. Metoprolol tartrate 12.5 mg twice daily. 4. Nitrostat sublingual p.r.n. 5. Prasugrel 10 mg daily. 6. Cetirizine 10 mg daily. 7. Clonazepam 1 mg 3 times daily, as before as needed. 8. Dexilant 60 mg daily. 9. Glimepiride 1 mg daily. 10.Lisinopril 5 mg daily. 11.Lyrica 75 mg 3 times daily as before. 12.Zanaflex 2 mg 2 tablets at night as before. 13.The patient has been advised to stop atenolol, morphine, oxycodone,     pantoprazole, promethazine, tramadol, and Trintellix.  DIET:  Low-salt low-cholesterol 1800 calories ADA diet.  Monitor blood pressure and blood sugar daily and chart.  The patient will be seen by phase 1 cardiac rehab prior to discharge and will be scheduled for phase 2 cardiac rehab as  outpatient.  CONDITION AT DISCHARGE:  Stable.  Post PCI instructions have been given. The patient has been encouraged regarding lifestyle changes diet, exercise, and also smoking cessation to which he agrees.  CONDITION AT DISCHARGE:  Stable.  BRIEF HISTORY AND HOSPITAL COURSE:  Mr. Michael Schmitt is a 44 year old male, with past medical history significant for hypertension, diabetes mellitus, hyperlipidemia, tobacco abuse, positive family history of coronary artery disease, depression, anxiety disorder, and degenerative joint disease.  He came to the ER by EMS, as code STEMI was called.  The patient complained of recurrent chest pain that started on Tuesday off and on.  Called the EMS on Tuesday, had  EKG done, which was normal as per the patient and was told to have acid reflux today.  He had chest pain, it got worst, so decided to call EMS again.  Chest pain radiating to the left arm, back associated with nausea.  EKG done on the field showed sinus tach with ST elevation in inferior leads and ST depression in leads V1 to V3, and reciprocal ST depression in lead 1 aVL, suggestive of acute inferoposterior wall MI.  The patient was emergently brought to the cath lab.  The patient denies any cocaine abuse.  Denies shortness of breath.  Denies palpitation, lightheadedness or syncope.  PHYSICAL EXAMINATION:  GENERAL:  He was alert, awake, oriented x3. VITAL SIGNS:  Blood  pressure was 148/90 and pulse 91.  He was afebrile. HEENT:  Conjunctivae were pink. NECK:  Supple.  No JVD.  No bruit. LUNGS:  Clear to auscultation without rhonchi or rales. CARDIOVASCULAR:  S1, S2 was normal.  There was soft systolic murmur and S4 gallop. ABDOMEN:  Soft.  Bowel sounds present and nontender. EXTREMITIES:  There is no clubbing, cyanosis or edema.  LABORATORY DATA:  Sodium was 134, potassium 4.2, BUN was 8, creatinine 0.87, and glucose was 102.  Troponin I was first set 3.34, second set 1.86, post PCI 4.79,  5.13, 4.67, 4.35, and today it is down to 3.17. Last labs today; sodium 139, potassium 4.2, BUN 9, and creatinine 1.08. Glucose was 121.  Hemoglobin is 14.1, hematocrit 42.9, and white count of 9.9.  His cholesterol was 184, triglycerides were 321, HDL was low at 20, and LDL was 92.  BRIEF HOSPITAL COURSE:  The patient was directly brought to the cath lab and underwent left cardiac cath with selective left and right coronary angiography and PTCA stenting to LAD, obtuse marginal, and RCA.  As per procedure report, the patient tolerated the procedure well.  There were no complications.  Postprocedure, the patient did not have any episodes of chest pain during the hospital stay.  His groin is stable with no evidence of hematoma or bruit.  The patient is ambulating in room without any problems.  The patient has been discussed extensively regarding compliance with diet, medication, lifestyle changes, smoking cessation, and joining phase 2 cardiac rehab.  The patient agrees.  The patient will be followed up in my office in 1 week.     Allegra Lai. Terrence Dupont, M.D.     MNH/MEDQ  D:  01/03/2015  T:  01/03/2015  Job:  [id number]

## 2015-01-03 NOTE — Discharge Instructions (Signed)
Acute Coronary Syndrome °Acute coronary syndrome (ACS) is a serious problem in which there is suddenly not enough blood and oxygen supplied to the heart. ACS may mean that one or more of the blood vessels in your heart (coronary arteries) may be blocked. ACS can result in chest pain or a heart attack (myocardial infarction or MI). °CAUSES °This condition is caused by atherosclerosis, which is the buildup of fat and cholesterol (plaque) on the inside of the arteries. Over time, the plaque may narrow or block the artery, and this will lessen blood flow to the heart. Plaque can also become weak and break off within a coronary artery to form a clot and cause a sudden blockage. °RISK FACTORS °The risks factors of this condition include: °· High cholesterol levels. °· High blood pressure (hypertension). °· Smoking. °· Diabetes. °· Age. °· Family history of chest pain, heart disease, or stroke. °· Lack of exercise. °SYMPTOMS °The most common signs of this condition include: °· Chest pain, which can be: °¨ A crushing or squeezing in the chest. °¨ A tightness, pressure, fullness, or heaviness in the chest. °¨ Present for more than a few minutes, or it can stop and recur. °· Pain in the arms, neck, jaw, or back. °· Unexplained heartburn or indigestion. °· Shortness of breath. °· Nausea. °· Sudden cold sweats. °· Feeling light-headed or dizzy. °Sometimes, this condition has no symptoms. °DIAGNOSIS °ACS may be diagnosed through the following tests: °· Electrocardiogram (ECG). °· Blood tests. °· Coronary angiogram. This is a procedure to look at the coronary arteries to see if there is any blockage. °TREATMENT °Treatment for ACS may include: °· Healthy behavioral changes to reduce or control risk factors. °· Medicine. °· Coronary stenting. A stent helps to keep an artery open. °· Coronary angioplasty. This procedure widens a narrowed or blocked artery. °· Coronary artery bypass surgery. This will allow your blood to pass the  blockage (bypass) to reach your heart. °HOME CARE INSTRUCTIONS °Eating and Drinking °· Follow a heart-healthy diet. A dietitian can you help to educate you about healthy food options and changes. °· Use healthy cooking methods such as roasting, grilling, broiling, baking, poaching, steaming, or stir-frying. Talk to a dietitian to learn more about healthy cooking methods. °Medicines °· Take medicines only as directed by your health care provider. °· Do not take the following medicines unless your health care provider approves: °¨ Nonsteroidal anti-inflammatory drugs (NSAIDs), such as ibuprofen, naproxen, or celecoxib. °¨ Vitamin supplements that contain vitamin A, vitamin E, or both. °¨ Hormone replacement therapy that contains estrogen with or without progestin. °· Stop illegal drug use. °Activities °· Follow an exercise program that is approved by your health care provider. °· Plan rest periods when you are fatigued. °Lifestyle °· Do not use any tobacco products, including cigarettes, chewing tobacco, or electronic cigarettes. If you need help quitting, ask your health care provider. °· If you drink alcohol, and your health care provider approves, limit your alcohol intake to no more than 1 drink per day. One drink equals 12 ounces of beer, 5 ounces of wine, or 1½ ounces of hard liquor. °· Learn to manage stress. °· Maintain a healthy weight. Lose weight as approved by your health care provider. °General Instructions °· Manage other health conditions, such as hypertension and diabetes, as directed by your health care provider. °· Keep all follow-up visits as directed by your health care provider. This is important. °· Your health care provider may ask you to monitor your blood   pressure. A blood pressure reading consists of a higher number over a lower number, such as 110 over 72, written as 110/72. Ideally, your blood pressure should be:  Below 140/90 if you have no other medical conditions.  Below 130/80 if  you have diabetes or kidney disease. SEEK IMMEDIATE MEDICAL CARE IF:  You have pain in your chest, neck, arm, jaw, stomach, or back that lasts more than a few minutes, is recurring, or is not relieved by taking medicine under your tongue (sublingual nitroglycerin).  You have profuse sweating without cause.  You have unexplained:  Heartburn or indigestion.  Shortness of breath or difficulty breathing.  Nausea or vomiting.  Fatigue.  Feelings of nervousness or anxiety.  Weakness.  Diarrhea.  You have sudden light-headedness or dizziness.  You faint. These symptoms may represent a serious problem that is an emergency. Do not wait to see if the symptoms will go away. Get medical help right away. Call your local emergency services (911 in the [place] Do not drive yourself to the clinic or hospital.   This information is not intended to replace advice given to you by your health care provider. Make sure you discuss any questions you have with your health care provider.   Document Released: 03/13/2005 Document Revised: 04/03/2014 Document Reviewed: 07/15/2013 Elsevier Interactive Patient Education 2016 Elsevier [employer]. Coronary Angiogram With Stent Coronary angiography with stent placement is a procedure to widen or open a narrow blood vessel of the heart (coronary artery). When a coronary artery becomes partially blocked, it decreases blood flow to that area. This may lead to chest pain or a heart attack (myocardial infarction). Arteries may become blocked by cholesterol buildup (plaque) in the lining or wall.  A stent is a small piece of metal that looks like a mesh or a spring. Stent placement may be done right after a coronary angiography in which a blocked artery is found or as a treatment for a heart attack.  LET [facility] CARE PROVIDER KNOW ABOUT:  Any allergies you have.   All medicines you are taking, including vitamins, herbs, eye drops, creams, and over-the-counter  medicines.   Previous problems you or members of your family have had with the use of anesthetics.   Any blood disorders you have.   Previous surgeries you have had.   Medical conditions you have. RISKS AND COMPLICATIONS Generally, coronary angiography with stent is a safe procedure. However, problems can occur and include:  Damage to the heart or its blood vessels.   A return of blockage.   Bleeding, infection, or bruising at the insertion site.   A collection of blood under the skin (hematoma) at the insertion site.  Blood clot in another part of the body.   Kidney injury.   Allergic reaction to the dye or contrast used.   Bleeding into the abdomen (retroperitoneal bleeding). BEFORE THE PROCEDURE  Do not eat or drink anything after midnight on the night before the procedure or as directed by your health care provider.  Ask your health care provider about changing or stopping your regular medicines. This is especially important if you are taking diabetes medicines or blood thinners.  Your health care provider will make sure you understand the procedure as well as the risks and potential problems associated with the procedure.  PROCEDURE  You may be given a medicine to help you relax before and during the procedure (sedative). This medicine will be given through an IV tube that is put into one  of your veins.   The area where the catheter will be inserted will be shaved and cleaned. This is usually done in the groin but may be done in the fold of your arm (near your elbow) or in the wrist.   A medicine will be given to numb the area where the catheter will be inserted (local anesthetic).   The catheter will be inserted into an artery using a guide wire. A type of X-ray (fluoroscopy) will be used to help guide the catheter to the opening of the blocked artery.   A dye will then be injected into the catheter, and X-rays will be taken. The dye will help to  show where any narrowing or blockages are located in the heart arteries.   A tiny wire will be guided to the blocked spot, and a balloon will be inflated to make the artery wider. The stent will be expanded and will crush the plaque into the wall of the vessel. The stent will hold the area open like a scaffolding and improve the blood flow.   Sometimes the artery may be made wider using a laser or other tools to remove plaque.   When the blood flow is better, the catheter will be removed. The lining of the artery will grow over the stent, which stays where it was placed.  AFTER THE PROCEDURE  If the procedure is done through the leg, you will be kept in bed lying flat for about 6 hours. You will be instructed to not bend or cross your legs.   The insertion site will be checked frequently.   The pulse in your feet or wrist will be checked frequently.   Additional blood tests, X-rays, and electrocardiography may be done.   This information is not intended to replace advice given to you by your health care provider. Make sure you discuss any questions you have with your health care provider.   Document Released: 09/17/2002 Document Revised: 04/03/2014 Document Reviewed: 08/05/2012 Elsevier Interactive Patient Education [employer].

## 2015-01-03 NOTE — Progress Notes (Signed)
Pt stable for discharge.  IV removed, cannula intact.  Discharge instructions given to patient.  He stated his understanding.  Prescriptions electronically sent to patients pharmacy.  Pt discharged home via POV with family.

## 2015-01-03 NOTE — Discharge Summary (Signed)
Discharge summary dictated on 01/03/2015 dictation number is [phone]

## 2015-01-03 NOTE — Care Management Note (Signed)
Case Management Note  Patient Details  Name: Michael Schmitt MRN: [id number] Date of Birth: [birth year]  Subjective/Objective:                  Recurrent chest pain  Action/Plan: Discharge planning  Expected Discharge Date:  01/03/15               Expected Discharge Plan:  Home/Self Care  In-House Referral:     Discharge planning Services  CM Consult, Medication Assistance  Post Acute Care Choice:    Choice offered to:     DME Arranged:    DME Agency:     HH Arranged:    [place] Agency:     Status of Service:  Completed, signed off  Medicare Important Message Given:    Date Medicare IM Given:    Medicare IM give by:    Date Additional Medicare IM Given:    Additional Medicare Important Message give by:     If discussed at [place] of Stay Meetings, dates discussed:    Additional Comments: Cm met with pt in room and gave pt effient free 30 day trail card.  Pt verbalizes understanding this card will cover the cost of today's discharge prescription and give enough time for medication to be authorized for refills.  No other CM needs were communicated. Dellie Catholic, RN 01/03/2015, 1:05 PM

## 2015-01-04 ENCOUNTER — Telehealth (HOSPITAL_COMMUNITY): Payer: Self-pay | Admitting: *Deleted

## 2015-01-04 ENCOUNTER — Encounter (HOSPITAL_COMMUNITY): Payer: Self-pay | Admitting: Cardiology

## 2015-01-04 LAB — HEMOGLOBIN A1C
Hgb A1c MFr Bld: 5.6 % (ref 4.8–5.6)
Mean Plasma Glucose: 114 mg/dL

## 2015-01-04 MED FILL — Lidocaine Inj 1% w/ Epinephrine-1:100000: INTRAMUSCULAR | Qty: 20 | Status: AC

## 2015-01-05 MED FILL — Tirofiban HCl in NaCl IV Soln 25 MG/500ML (Base Equiv): INTRAVENOUS | Qty: 250 | Status: AC

## 2015-01-05 MED FILL — Famotidine in NaCl 0.9% IV Soln 20 MG/50ML: INTRAVENOUS | Qty: 50 | Status: AC

## 2015-05-13 ENCOUNTER — Emergency Department
Admission: EM | Admit: 2015-05-13 | Discharge: 2015-05-13 | Disposition: A | Discharge status: Home / Self Care | Payer: Medicaid Other

## 2015-05-13 NOTE — ED Notes (Signed)
Pt called for triage without answer 

## 2015-05-13 NOTE — ED Notes (Signed)
Pt called to triage without answer.  

## 2015-06-06 ENCOUNTER — Emergency Department
Admission: EM | Admit: 2015-06-06 | Discharge: 2015-06-06 | Disposition: A | Discharge status: Home / Self Care | Payer: Medicaid Other | Attending: Emergency Medicine | Admitting: Emergency Medicine

## 2015-06-06 ENCOUNTER — Encounter: Payer: Self-pay | Admitting: *Deleted

## 2015-06-06 DIAGNOSIS — I129 Hypertensive chronic kidney disease with stage 1 through stage 4 chronic kidney disease, or unspecified chronic kidney disease: Secondary | ICD-10-CM | POA: Diagnosis not present

## 2015-06-06 DIAGNOSIS — Z79899 Other long term (current) drug therapy: Secondary | ICD-10-CM | POA: Diagnosis not present

## 2015-06-06 DIAGNOSIS — E119 Type 2 diabetes mellitus without complications: Secondary | ICD-10-CM | POA: Diagnosis not present

## 2015-06-06 DIAGNOSIS — F419 Anxiety disorder, unspecified: Secondary | ICD-10-CM | POA: Insufficient documentation

## 2015-06-06 DIAGNOSIS — Z7982 Long term (current) use of aspirin: Secondary | ICD-10-CM | POA: Diagnosis not present

## 2015-06-06 DIAGNOSIS — N189 Chronic kidney disease, unspecified: Secondary | ICD-10-CM | POA: Diagnosis not present

## 2015-06-06 DIAGNOSIS — F17200 Nicotine dependence, unspecified, uncomplicated: Secondary | ICD-10-CM | POA: Insufficient documentation

## 2015-06-06 DIAGNOSIS — Z760 Encounter for issue of repeat prescription: Secondary | ICD-10-CM

## 2015-06-06 NOTE — ED Notes (Addendum)
Goes to [place] [facility] for meds - states no longer has medicaid because he makes too much money thru disability.  also ran out of some of his meds, ie: his blood thinner in February and had a panic attack thinking about it. Gave pt info on [facility], and advised that they go by income which may help, or that his new doctor could help him find less expensive substitutes. Pt states his medicaid is to renew in august, but can't wait til then, and also wants to quit smoking. Again advised he could use the [facility].

## 2015-06-06 NOTE — Discharge Instructions (Signed)
Follow-up with the medication management clinic as directed.

## 2015-06-06 NOTE — ED Notes (Signed)
Pt asking if the [facility] would help him refill his pain management prescriptions - advised he would have to speak with them about that.

## 2015-06-06 NOTE — ED Notes (Signed)
Pt presents w/ multiple vague complaints related to health conditions and life stressors as well as an inability to pay for and obtain medications. Pt states he has run out of most of his home meds. Pt has a lot of social complaints related to his health problems and medical problems.

## 2015-06-06 NOTE — ED Provider Notes (Signed)
[facility] Emergency Department Provider Note  ____________________________________________  Time seen: Approximately 9:49 PM  I have reviewed the triage vital signs and the nursing notes.   HISTORY  Chief Complaint Panic Attack    HPI Michael Schmitt is a 45 y.o. male patient state Medicaid has been counselled because he is receiving too much money on disability. Patient say ran out of his medications this month and is anxious because the medicines are expensive. Patient is asked for samples of his medications until he can find another source to pay for his prescriptions. Patient states he is becoming anxious to his condition of not being able to afford medications.   Past Medical History  Diagnosis Date  . Hypertension   . Mental disorder   . Anxiety   . Shortness of breath   . RLS (restless legs syndrome)     detected on sleep study  . GERD (gastroesophageal reflux disease)   . Headache(784.0)   . Blood dyscrasia     told that when he was young he was a" free bleeder"  . Arthritis     cerv. stenosis, spondylosis, HNP- lower back , has been followed in pain clinic, has  had injection s in cerv. area  . Chronic kidney disease     renal calculi- passed spontaneously  . Anxiety   . Depression   . Diabetes mellitus without complication ([place])   . Cervicogenic headache 07/24/2014  . Cervical spondylosis without myelopathy 07/24/2014  . Fatty liver     Patient Active Problem List   Diagnosis Date Noted  . STEMI (ST elevation myocardial infarction) ([place]) 01/01/2015  . 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 Common bile duct dilation 10/06/2014  . Right lower quadrant abdominal pain 10/06/2014  . Loss of weight 10/06/2014  . Cervicogenic headache 07/24/2014  . Cervical spondylosis without myelopathy 07/24/2014    Past Surgical History  Procedure Laterality Date  . Nasal sinus surgery      2005  . Anterior cervical decomp/discectomy  fusion  11/13/2011    Procedure: ANTERIOR CERVICAL DECOMPRESSION/DISCECTOMY FUSION 1 LEVEL;  Surgeon: [name], MD;  Location: [place];  Service: Neurosurgery;  Laterality: N/A;  Anterior Cervical Decompression/discectomy Fusion. Cervical three-four.  . Cardiac catheterization N/A 01/01/2015    Procedure: Left Heart Cath and Coronary Angiography;  Surgeon: [name], MD;  Location: [facility];  Service: Cardiovascular;  Laterality: N/A;    Current Outpatient Rx  Name  Route  Sig  Dispense  Refill  . aspirin 81 MG chewable tablet   Oral   Chew 1 tablet (81 mg total) by mouth daily.   30 tablet   3   . atorvastatin (LIPITOR) 80 MG tablet   Oral   Take 1 tablet (80 mg total) by mouth daily at 6 PM.   30 tablet   3   . cetirizine (ZYRTEC) 10 MG tablet   Oral   Take 10 mg by mouth daily.      5   . clonazePAM (KLONOPIN) 1 MG tablet   Oral   Take 1 mg by mouth 3 (three) times daily.          Marland Kitchen dexlansoprazole (DEXILANT) 60 MG capsule   Oral   Take 60 mg by mouth daily.         Marland Kitchen glimepiride (AMARYL) 1 MG tablet   Oral   Take 1 mg by mouth daily.          Marland Kitchen  lisinopril (PRINIVIL,ZESTRIL) 5 MG tablet   Oral   Take 5 mg by mouth at bedtime as needed (blood pressure >150/100).          . Menthol, Topical Analgesic, (ICY HOT EX)   Apply externally   Apply 1 application topically at bedtime.         . metoprolol tartrate (LOPRESSOR) 25 MG tablet   Oral   Take 0.5 tablets (12.5 mg total) by mouth 2 (two) times daily.   30 tablet   3   . nitroGLYCERIN (NITROSTAT) 0.4 MG SL tablet   Sublingual   Place 1 tablet (0.4 mg total) under the tongue every 5 (five) minutes x 3 doses as needed for chest pain.   25 tablet   12   . prasugrel (EFFIENT) 10 MG TABS tablet   Oral   Take 1 tablet (10 mg total) by mouth daily.   30 tablet   11   . pregabalin (LYRICA) 75 MG capsule   Oral   Take 75 mg by mouth 3 (three) times daily.         Marland Kitchen tiZANidine  (ZANAFLEX) 2 MG tablet   Oral   Take 2 tablets (4 mg total) by mouth at bedtime. Patient not taking: Reported on 01/02/2015   60 tablet   2     Allergies Prednisone; Hydrocodone; and Varenicline  Family History  Problem Relation Age of Onset  . Prostate cancer Father   . Hypertension Mother   . Kidney Stones Mother   . COPD Sister   . Hypertension Sister   . Diabetes Sister     Social History Social History  Substance Use Topics  . Smoking status: Current Every Day Smoker -- 1.50 packs/day for 17 years  . Smokeless tobacco: Never Used  . Alcohol Use: Yes     Comment: beer- 1-2 x's per month     Review of Systems Constitutional: No fever/chills Eyes: No visual changes. ENT: No sore throat. Cardiovascular: Denies chest pain. Respiratory: Denies shortness of breath. Gastrointestinal: No abdominal pain.  No nausea, no vomiting.  No diarrhea.  No constipation. Genitourinary: Negative for dysuria. Musculoskeletal: Negative for back pain. Skin: Negative for rash. Neurological: Negative for headaches, focal weakness or numbness. Psychiatric:Anxiety Endocrine:Hypertension/ diabetes Hematological/Lymphatic: Allergic/Immunilogical: The medication list 10-point ROS otherwise negative.  ____________________________________________   PHYSICAL EXAM:  VITAL SIGNS: ED Triage Vitals  Enc Vitals Group     BP 06/06/15 2104 111/74 mmHg     Pulse Rate 06/06/15 2104 72     Resp 06/06/15 2104 18     Temp 06/06/15 2104 98.2 F (36.8 C)     Temp Source 06/06/15 2104 Oral     SpO2 06/06/15 2104 96 %     Weight --      Height --      Head Cir --      Peak Flow --      Pain Score 06/06/15 2046 7     Pain Loc --      Pain Edu? --      Excl. in [place]? --     Constitutional: Alert and oriented. Well appearing and in no acute distress. Eyes: Conjunctivae are normal. PERRL. EOMI. Head: Atraumatic. Nose: No congestion/rhinnorhea. Mouth/Throat: Mucous membranes are moist.   Oropharynx non-erythematous. Neck: No stridor.  No cervical spine tenderness to palpation. Hematological/Lymphatic/Immunilogical: No cervical lymphadenopathy. Cardiovascular: Normal rate, regular rhythm. Grossly normal heart sounds.  Good peripheral circulation. Respiratory: Normal respiratory effort.  No retractions.  Lungs CTAB. Gastrointestinal: Soft and nontender. No distention. No abdominal bruits. No CVA tenderness. Musculoskeletal: No lower extremity tenderness nor edema.  No joint effusions. Neurologic:  Normal speech and language. No gross focal neurologic deficits are appreciated. No gait instability. Skin:  Skin is warm, dry and intact. No rash noted. Psychiatric: Mood and affect are normal. Speech and behavior are normal.  ____________________________________________   LABS (all labs ordered are listed, but only abnormal results are displayed)  Labs Reviewed - No data to display ____________________________________________  EKG   ____________________________________________  RADIOLOGY   ____________________________________________   PROCEDURES  Procedure(s) performed: None  Critical Care performed: No  ____________________________________________   INITIAL IMPRESSION / ASSESSMENT AND PLAN / ED COURSE  Pertinent labs & imaging results that were available during my care of the patient were reviewed by me and considered in my medical decision making (see chart for details).  Medication refill. Patient is referred to medication management clinic to determine status to obtain free prescriptions. ____________________________________________   FINAL CLINICAL IMPRESSION(S) / ED DIAGNOSES  Final diagnoses:  None      Sable Feil, PA-C 06/06/15 2157  Delman Kitten, MD 06/07/15 [phone]

## 2015-06-20 ENCOUNTER — Inpatient Hospital Stay
Admission: EM | Admit: 2015-06-20 | Discharge: 2015-06-21 | DRG: 311 | Disposition: A | Discharge status: Home / Self Care | Payer: Medicaid Other | Attending: Internal Medicine | Admitting: Internal Medicine

## 2015-06-20 ENCOUNTER — Emergency Department: Payer: Medicaid Other

## 2015-06-20 ENCOUNTER — Encounter: Payer: Self-pay | Admitting: Emergency Medicine

## 2015-06-20 DIAGNOSIS — Z7982 Long term (current) use of aspirin: Secondary | ICD-10-CM | POA: Diagnosis not present

## 2015-06-20 DIAGNOSIS — Z9119 Patient's noncompliance with other medical treatment and regimen: Secondary | ICD-10-CM | POA: Diagnosis not present

## 2015-06-20 DIAGNOSIS — Z825 Family history of asthma and other chronic lower respiratory diseases: Secondary | ICD-10-CM

## 2015-06-20 DIAGNOSIS — Z981 Arthrodesis status: Secondary | ICD-10-CM

## 2015-06-20 DIAGNOSIS — I252 Old myocardial infarction: Secondary | ICD-10-CM | POA: Diagnosis not present

## 2015-06-20 DIAGNOSIS — F331 Major depressive disorder, recurrent, moderate: Secondary | ICD-10-CM | POA: Diagnosis not present

## 2015-06-20 DIAGNOSIS — Z79899 Other long term (current) drug therapy: Secondary | ICD-10-CM | POA: Diagnosis not present

## 2015-06-20 DIAGNOSIS — Z955 Presence of coronary angioplasty implant and graft: Secondary | ICD-10-CM

## 2015-06-20 DIAGNOSIS — Z8042 Family history of malignant neoplasm of prostate: Secondary | ICD-10-CM | POA: Diagnosis not present

## 2015-06-20 DIAGNOSIS — F410 Panic disorder [episodic paroxysmal anxiety] without agoraphobia: Secondary | ICD-10-CM | POA: Diagnosis present

## 2015-06-20 DIAGNOSIS — K219 Gastro-esophageal reflux disease without esophagitis: Secondary | ICD-10-CM | POA: Diagnosis present

## 2015-06-20 DIAGNOSIS — G8929 Other chronic pain: Secondary | ICD-10-CM | POA: Diagnosis present

## 2015-06-20 DIAGNOSIS — F17210 Nicotine dependence, cigarettes, uncomplicated: Secondary | ICD-10-CM | POA: Diagnosis present

## 2015-06-20 DIAGNOSIS — Z888 Allergy status to other drugs, medicaments and biological substances status: Secondary | ICD-10-CM

## 2015-06-20 DIAGNOSIS — N189 Chronic kidney disease, unspecified: Secondary | ICD-10-CM | POA: Diagnosis present

## 2015-06-20 DIAGNOSIS — F329 Major depressive disorder, single episode, unspecified: Secondary | ICD-10-CM | POA: Diagnosis present

## 2015-06-20 DIAGNOSIS — F419 Anxiety disorder, unspecified: Secondary | ICD-10-CM | POA: Diagnosis present

## 2015-06-20 DIAGNOSIS — E871 Hypo-osmolality and hyponatremia: Secondary | ICD-10-CM | POA: Diagnosis present

## 2015-06-20 DIAGNOSIS — Z87442 Personal history of urinary calculi: Secondary | ICD-10-CM | POA: Diagnosis not present

## 2015-06-20 DIAGNOSIS — E1122 Type 2 diabetes mellitus with diabetic chronic kidney disease: Secondary | ICD-10-CM | POA: Diagnosis present

## 2015-06-20 DIAGNOSIS — Z833 Family history of diabetes mellitus: Secondary | ICD-10-CM | POA: Diagnosis not present

## 2015-06-20 DIAGNOSIS — I129 Hypertensive chronic kidney disease with stage 1 through stage 4 chronic kidney disease, or unspecified chronic kidney disease: Secondary | ICD-10-CM | POA: Diagnosis present

## 2015-06-20 DIAGNOSIS — I25119 Atherosclerotic heart disease of native coronary artery with unspecified angina pectoris: Secondary | ICD-10-CM | POA: Diagnosis present

## 2015-06-20 DIAGNOSIS — Z716 Tobacco abuse counseling: Secondary | ICD-10-CM | POA: Diagnosis not present

## 2015-06-20 DIAGNOSIS — Z8249 Family history of ischemic heart disease and other diseases of the circulatory system: Secondary | ICD-10-CM

## 2015-06-20 DIAGNOSIS — Z9889 Other specified postprocedural states: Secondary | ICD-10-CM

## 2015-06-20 DIAGNOSIS — E876 Hypokalemia: Secondary | ICD-10-CM | POA: Diagnosis present

## 2015-06-20 DIAGNOSIS — M1990 Unspecified osteoarthritis, unspecified site: Secondary | ICD-10-CM | POA: Diagnosis present

## 2015-06-20 DIAGNOSIS — Z885 Allergy status to narcotic agent status: Secondary | ICD-10-CM

## 2015-06-20 DIAGNOSIS — G2581 Restless legs syndrome: Secondary | ICD-10-CM | POA: Diagnosis present

## 2015-06-20 DIAGNOSIS — Z599 Problem related to housing and economic circumstances, unspecified: Secondary | ICD-10-CM

## 2015-06-20 DIAGNOSIS — F32A Depression, unspecified: Secondary | ICD-10-CM

## 2015-06-20 DIAGNOSIS — R778 Other specified abnormalities of plasma proteins: Secondary | ICD-10-CM

## 2015-06-20 DIAGNOSIS — R7989 Other specified abnormal findings of blood chemistry: Secondary | ICD-10-CM

## 2015-06-20 DIAGNOSIS — I248 Other forms of acute ischemic heart disease: Principal | ICD-10-CM | POA: Diagnosis present

## 2015-06-20 HISTORY — DX: Atherosclerotic heart disease of native coronary artery without angina pectoris: I25.10

## 2015-06-20 LAB — CBC
HCT: 45.4 % (ref 40.0–52.0)
HEMOGLOBIN: 15.5 g/dL (ref 13.0–18.0)
MCH: 30.1 pg (ref 26.0–34.0)
MCHC: 34.1 g/dL (ref 32.0–36.0)
MCV: 88.2 fL (ref 80.0–100.0)
PLATELETS: 239 10*3/uL (ref 150–440)
RBC: 5.15 MIL/uL (ref 4.40–5.90)
RDW: 14.5 % (ref 11.5–14.5)
WBC: 10.5 10*3/uL (ref 3.8–10.6)

## 2015-06-20 LAB — BASIC METABOLIC PANEL
ANION GAP: 8 (ref 5–15)
BUN: 12 mg/dL (ref 6–20)
CALCIUM: 9.4 mg/dL (ref 8.9–10.3)
CO2: 23 mmol/L (ref 22–32)
CREATININE: 1.04 mg/dL (ref 0.61–1.24)
Chloride: 103 mmol/L (ref 101–111)
Glucose, Bld: 117 mg/dL — ABNORMAL HIGH (ref 65–99)
Potassium: 3.4 mmol/L — ABNORMAL LOW (ref 3.5–5.1)
SODIUM: 134 mmol/L — AB (ref 135–145)

## 2015-06-20 LAB — TROPONIN I
TROPONIN I: 0.09 ng/mL — AB (ref <0.031)
TROPONIN I: 0.12 ng/mL — AB (ref <0.031)

## 2015-06-20 MED ORDER — SODIUM CHLORIDE 0.9 % IV SOLN
INTRAVENOUS | Status: DC
  Administered 2015-06-20: via INTRAVENOUS

## 2015-06-20 MED ORDER — NITROGLYCERIN 0.4 MG SL SUBL: 0.4000 mg | SUBLINGUAL_TABLET | SUBLINGUAL | Status: DC | PRN

## 2015-06-20 MED ORDER — PRASUGREL HCL 10 MG PO TABS
10.0000 mg | ORAL_TABLET | Freq: Once | ORAL | Status: AC
  Administered 2015-06-20: 10 mg via ORAL
  Filled 2015-06-20: qty 1

## 2015-06-20 MED ORDER — ONDANSETRON HCL 4 MG/2ML IJ SOLN
4.0000 mg | Freq: Four times a day (QID) | INTRAMUSCULAR | Status: DC | PRN
  Administered 2015-06-20 – 2015-06-21 (×2): 4 mg via INTRAVENOUS
  Filled 2015-06-20 (×2): qty 2

## 2015-06-20 MED ORDER — KETOROLAC TROMETHAMINE 30 MG/ML IJ SOLN
15.0000 mg | Freq: Four times a day (QID) | INTRAMUSCULAR | Status: DC | PRN
  Administered 2015-06-20: 15 mg via INTRAVENOUS
  Filled 2015-06-20 (×2): qty 1

## 2015-06-20 MED ORDER — CLONAZEPAM 1 MG PO TABS
1.0000 mg | ORAL_TABLET | Freq: Three times a day (TID) | ORAL | Status: DC
  Administered 2015-06-20 – 2015-06-21 (×2): 1 mg via ORAL
  Filled 2015-06-20: qty 2
  Filled 2015-06-20: qty 1

## 2015-06-20 MED ORDER — DIAZEPAM 5 MG PO TABS
5.0000 mg | ORAL_TABLET | Freq: Once | ORAL | Status: AC
  Administered 2015-06-20: 5 mg via ORAL
  Filled 2015-06-20: qty 1

## 2015-06-20 MED ORDER — PANTOPRAZOLE SODIUM 40 MG PO TBEC
40.0000 mg | DELAYED_RELEASE_TABLET | Freq: Every day | ORAL | Status: DC
  Administered 2015-06-20 – 2015-06-21 (×2): 40 mg via ORAL
  Filled 2015-06-20 (×2): qty 1

## 2015-06-20 MED ORDER — ALPRAZOLAM 0.25 MG PO TABS
0.2500 mg | ORAL_TABLET | Freq: Two times a day (BID) | ORAL | Status: DC | PRN
  Administered 2015-06-20: 0.25 mg via ORAL
  Filled 2015-06-20: qty 1

## 2015-06-20 MED ORDER — TIZANIDINE HCL 4 MG PO TABS
4.0000 mg | ORAL_TABLET | Freq: Every day | ORAL | Status: DC
  Administered 2015-06-20: 4 mg via ORAL
  Filled 2015-06-20: qty 1

## 2015-06-20 MED ORDER — PRASUGREL HCL 10 MG PO TABS
10.0000 mg | ORAL_TABLET | Freq: Every day | ORAL | Status: DC
  Administered 2015-06-21: 10 mg via ORAL
  Filled 2015-06-20: qty 1

## 2015-06-20 MED ORDER — LISINOPRIL 5 MG PO TABS: 5.0000 mg | ORAL_TABLET | Freq: Every evening | ORAL | Status: DC | PRN

## 2015-06-20 MED ORDER — LORATADINE 10 MG PO TABS
10.0000 mg | ORAL_TABLET | Freq: Every day | ORAL | Status: DC
  Administered 2015-06-20 – 2015-06-21 (×2): 10 mg via ORAL
  Filled 2015-06-20 (×2): qty 1

## 2015-06-20 MED ORDER — METOPROLOL TARTRATE 25 MG PO TABS
12.5000 mg | ORAL_TABLET | Freq: Two times a day (BID) | ORAL | Status: DC
  Administered 2015-06-20: 12.5 mg via ORAL
  Filled 2015-06-20: qty 1

## 2015-06-20 MED ORDER — ACETAMINOPHEN 325 MG PO TABS: 650.0000 mg | ORAL_TABLET | ORAL | Status: DC | PRN

## 2015-06-20 MED ORDER — ASPIRIN 81 MG PO CHEW
324.0000 mg | CHEWABLE_TABLET | Freq: Once | ORAL | Status: AC
  Administered 2015-06-20: 324 mg via ORAL
  Filled 2015-06-20: qty 4

## 2015-06-20 MED ORDER — KETOROLAC TROMETHAMINE 30 MG/ML IJ SOLN
INTRAMUSCULAR | Status: AC
  Filled 2015-06-20: qty 1

## 2015-06-20 NOTE — ED Notes (Signed)
MD Williams at bedside.  

## 2015-06-20 NOTE — ED Notes (Addendum)
Pt c/o neck pain for years.  Reports single parent of 2 kids and has had anxiety and worries about having another heart attack after having one last November.  Felt like heart was racing.  Anxiety is biggest thing pt wants help with.  Woke up at 3 am with anxiety today.  Has had some shortness of breath.  C/o soreness in left chest.  Feels like he may dehydrated.  "i would like to be admitted to the basement, this is everyday and i need help.  i started ECT therapy but his driver could not get here anymore".  Denies SI/HI.  Denies visual or auditory hallucination.

## 2015-06-20 NOTE — ED Notes (Signed)
Notified MD Jimmye Norman of pt's pain score, and pt's request not to go to the [facility]

## 2015-06-20 NOTE — ED Notes (Signed)
Pt requesting to speak to Chaplain. Chaplain called

## 2015-06-20 NOTE — ED Notes (Signed)
Admitting MD at bedside.

## 2015-06-20 NOTE — ED Notes (Signed)
Brought pt pillow, lowered head of bed
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
## 2015-06-20 NOTE — ED Notes (Signed)
TTS at bedside. 
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
## 2015-06-20 NOTE — ED Notes (Signed)
Chaplain at bedside

## 2015-06-20 NOTE — H&P (Addendum)
[facility] NAME: Michael Schmitt    MR#:  [id number]  DATE OF BIRTH:  [birth year]  DATE OF ADMISSION:  06/20/2015  PRIMARY CARE PHYSICIAN: [name], MD   REQUESTING/REFERRING PHYSICIAN: Earleen Newport, MD  CHIEF COMPLAINT:   Chief Complaint  Patient presents with  . Shortness of Breath  Chest pain and shortness of breath today.  HISTORY OF PRESENT ILLNESS:  Michael Schmitt  is a 45 y.o. male with a known history of Hypertension, diabetes, anxiety. The patient presented in the ED with chest pain and shortness of breath today. The chest pain is seen in substernal area, intermittent, sharp without radiation. Patient also complains of nausea but no vomiting or diarrhea. He has chronic lower back pain. He feels anxious but denies any depression. He recently run out of prasugrel. His troponin is elevated and trending up. He had heart attack and got stent placement last year.  PAST MEDICAL HISTORY:   Past Medical History  Diagnosis Date  . Hypertension   . Mental disorder   . Anxiety   . Shortness of breath   . RLS (restless legs syndrome)     detected on sleep study  . GERD (gastroesophageal reflux disease)   . Headache(784.0)   . Blood dyscrasia     told that when he was young he was a" free bleeder"  . Arthritis     cerv. stenosis, spondylosis, HNP- lower back , has been followed in pain clinic, has  had injection s in cerv. area  . Chronic kidney disease     renal calculi- passed spontaneously  . Anxiety   . Depression   . Diabetes mellitus without complication ([place])   . Cervicogenic headache 07/24/2014  . Cervical spondylosis without myelopathy 07/24/2014  . Fatty liver     PAST SURGICAL HISTORY:   Past Surgical History  Procedure Laterality Date  . Nasal sinus surgery      2005  . Anterior cervical decomp/discectomy fusion  11/13/2011    Procedure: ANTERIOR CERVICAL DECOMPRESSION/DISCECTOMY FUSION  1 LEVEL;  Surgeon: [name], MD;  Location: [facility];  Service: Neurosurgery;  Laterality: N/A;  Anterior Cervical Decompression/discectomy Fusion. Cervical three-four.  . Cardiac catheterization N/A 01/01/2015    Procedure: Left Heart Cath and Coronary Angiography;  Surgeon: [name], MD;  Location: [facility];  Service: Cardiovascular;  Laterality: N/A;    SOCIAL HISTORY:   Social History  Substance Use Topics  . Smoking status: Current Every Day Smoker -- 1.50 packs/day for 17 years  . Smokeless tobacco: Never Used  . Alcohol Use: Yes     Comment: beer- 1-2 x's per month     FAMILY HISTORY:   Family History  Problem Relation Age of Onset  . Prostate cancer Father   . Hypertension Mother   . Kidney Stones Mother   . COPD Sister   . Hypertension Sister   . Diabetes Sister     DRUG ALLERGIES:   Allergies  Allergen Reactions  . Prednisone Other (See Comments)    Hypertension, makes him feel spacey  . Hydrocodone Other (See Comments)    Headache, irritable  . Varenicline Other (See Comments)    Suicidal thoughts    REVIEW OF SYSTEMS:  CONSTITUTIONAL: No fever, fatigue or weakness.  EYES: No blurred or double vision.  EARS, NOSE, AND THROAT: No tinnitus or ear pain.  RESPIRATORY: No cough, Has shortness of breath, no  wheezing or hemoptysis.  CARDIOVASCULAR: Has chest pain, no orthopnea, edema.  GASTROINTESTINAL: Has nausea, no vomiting, diarrhea or abdominal pain.  GENITOURINARY: No dysuria, hematuria.  ENDOCRINE: No polyuria, nocturia,  HEMATOLOGY: No anemia, easy bruising or bleeding SKIN: No rash or lesion. MUSCULOSKELETAL: Chronic back pain. NEUROLOGIC: No tingling, numbness, weakness.  PSYCHIATRY: has anxiety but no depression.   MEDICATIONS AT HOME:   Prior to Admission medications   Medication Sig Start Date End Date Taking? Authorizing Provider  aspirin 81 MG chewable tablet Chew 1 tablet (81 mg total) by mouth daily. 01/03/15   [name], MD  atorvastatin (LIPITOR) 80 MG tablet Take 1 tablet (80 mg total) by mouth daily at 6 PM. 01/03/15   [name], MD  cetirizine (ZYRTEC) 10 MG tablet Take 10 mg by mouth daily. 11/19/14   Historical Provider, MD  clonazePAM (KLONOPIN) 1 MG tablet Take 1 mg by mouth 3 (three) times daily.     Historical Provider, MD  dexlansoprazole (DEXILANT) 60 MG capsule Take 60 mg by mouth daily.    Historical Provider, MD  glimepiride (AMARYL) 1 MG tablet Take 1 mg by mouth daily.     Historical Provider, MD  lisinopril (PRINIVIL,ZESTRIL) 5 MG tablet Take 5 mg by mouth at bedtime as needed (blood pressure >150/100).     Historical Provider, MD  Menthol, Topical Analgesic, (ICY HOT EX) Apply 1 application topically at bedtime.    Historical Provider, MD  metoprolol tartrate (LOPRESSOR) 25 MG tablet Take 0.5 tablets (12.5 mg total) by mouth 2 (two) times daily. 01/03/15   [name], MD  nitroGLYCERIN (NITROSTAT) 0.4 MG SL tablet Place 1 tablet (0.4 mg total) under the tongue every 5 (five) minutes x 3 doses as needed for chest pain. 01/03/15   [name], MD  prasugrel (EFFIENT) 10 MG TABS tablet Take 1 tablet (10 mg total) by mouth daily. 01/03/15   [name], MD  pregabalin (LYRICA) 75 MG capsule Take 75 mg by mouth 3 (three) times daily.    Historical Provider, MD  tiZANidine (ZANAFLEX) 2 MG tablet Take 2 tablets (4 mg total) by mouth at bedtime. Patient not taking: Reported on 01/02/2015 08/19/14   Kathrynn Ducking, MD      VITAL SIGNS:  Blood pressure 115/90, pulse 78, temperature 98.2 F (36.8 C), temperature source Oral, resp. rate 23, height 6\' 2"  (1.88 m), weight 92.534 kg (204 lb), SpO2 91 %.  PHYSICAL EXAMINATION:  GENERAL:  45 y.o.-year-old patient lying in the bed with no acute distress.  EYES: Pupils equal, round, reactive to light and accommodation. No scleral icterus. Extraocular muscles intact.  HEENT: Head atraumatic, normocephalic. Oropharynx and nasopharynx clear. Moist  oral mucosa. NECK:  Supple, no jugular venous distention. No thyroid enlargement, no tenderness.  LUNGS: Normal breath sounds bilaterally, no wheezing, rales,rhonchi or crepitation. No use of accessory muscles of respiration.  CARDIOVASCULAR: S1, S2 normal. No murmurs, rubs, or gallops.  ABDOMEN: Soft, mild tenderness, nondistended. Bowel sounds present. No organomegaly or mass.  EXTREMITIES: No pedal edema, cyanosis, or clubbing.  NEUROLOGIC: Cranial nerves II through XII are intact. Muscle strength 5/5 in all extremities. Sensation intact. Gait not checked.  PSYCHIATRIC: The patient is alert and oriented x 3.  SKIN: No obvious rash, lesion, or ulcer.   LABORATORY PANEL:   CBC  Recent Labs Lab 06/20/15 1721  WBC 10.5  HGB 15.5  HCT 45.4  PLT 239   ------------------------------------------------------------------------------------------------------------------  Chemistries   Recent Labs Lab 06/20/15 1721  NA 134*  K 3.4*  CL 103  CO2 23  GLUCOSE 117*  BUN 12  CREATININE 1.04  CALCIUM 9.4   ------------------------------------------------------------------------------------------------------------------  Cardiac Enzymes  Recent Labs Lab 06/20/15 1920  TROPONINI 0.12*   ------------------------------------------------------------------------------------------------------------------  RADIOLOGY:  Dg Chest 2 View  06/20/2015  CLINICAL DATA:  Chest pain, history of MI, heart racing EXAM: CHEST  2 VIEW COMPARISON:  09/09/2013 FINDINGS: Cardiomediastinal silhouette is stable. No acute infiltrate or pleural effusion. No pulmonary edema. Bony thorax is unremarkable. IMPRESSION: No active cardiopulmonary disease. Electronically Signed   By: Lahoma Crocker M.D.   On: 06/20/2015 17:48    EKG:   Orders placed or performed during the hospital encounter of 06/20/15  . EKG 12-Lead  . EKG 12-Lead  . ED EKG within 10 minutes  . ED EKG within 10 minutes    IMPRESSION AND  PLAN:   Chest pain with elevated troponin, possible non-STEMI Continue telemetry monitor, follow-up troponin, continue aspirin and Lipitor, heparin drip and nitroglycerin when necessary. Cardiology consult.  Hyponatremia and Hypokalemia. Give normal saline with potassium, follow-up BMP.  Hypertension. Continue Lopressor and lisinopril. Diabetes. Start a sliding scale. Hold by mouth glimepiride. Anxiety. Xanax when necessary.  Tobacco abuse. Smoking cessation was counseled for 3 minutes, give nicotine patch.  All the records are reviewed and case discussed with ED provider. Management plans discussed with the patient, family and they are in agreement.  CODE STATUS: Full code  TOTAL TIME TAKING CARE OF THIS PATIENT: 53 minutes.    [name] M.D on 06/20/2015 at 9:40 PM  Between 7am to 6pm - Pager - [phone]  After 6pm go to [email or web address] - password [email or web address] [facility]  Office  [phone]  CC: Primary care physician; [name], MD

## 2015-06-21 DIAGNOSIS — F331 Major depressive disorder, recurrent, moderate: Secondary | ICD-10-CM

## 2015-06-21 LAB — HEPARIN LEVEL (UNFRACTIONATED): HEPARIN UNFRACTIONATED: 0.48 [IU]/mL (ref 0.30–0.70)

## 2015-06-21 LAB — PROTIME-INR
INR: 0.97
Prothrombin Time: 13.1 seconds (ref 11.4–15.0)

## 2015-06-21 LAB — GLUCOSE, CAPILLARY
GLUCOSE-CAPILLARY: 107 mg/dL — AB (ref 65–99)
Glucose-Capillary: 118 mg/dL — ABNORMAL HIGH (ref 65–99)
Glucose-Capillary: 95 mg/dL (ref 65–99)

## 2015-06-21 LAB — CBC
HEMATOCRIT: 41.8 % (ref 40.0–52.0)
HEMOGLOBIN: 14.2 g/dL (ref 13.0–18.0)
MCH: 30.2 pg (ref 26.0–34.0)
MCHC: 34 g/dL (ref 32.0–36.0)
MCV: 89 fL (ref 80.0–100.0)
Platelets: 198 10*3/uL (ref 150–440)
RBC: 4.7 MIL/uL (ref 4.40–5.90)
RDW: 14.4 % (ref 11.5–14.5)
WBC: 8.4 10*3/uL (ref 3.8–10.6)

## 2015-06-21 LAB — APTT: aPTT: 32 seconds (ref 24–36)

## 2015-06-21 LAB — TROPONIN I: Troponin I: 0.1 ng/mL — ABNORMAL HIGH (ref <0.031)

## 2015-06-21 MED ORDER — SODIUM CHLORIDE 0.9% FLUSH: 3.0000 mL | INTRAVENOUS | Status: DC | PRN

## 2015-06-21 MED ORDER — PREGABALIN 50 MG PO CAPS
75.0000 mg | ORAL_CAPSULE | Freq: Three times a day (TID) | ORAL | Status: DC
  Administered 2015-06-21: 75 mg via ORAL
  Filled 2015-06-21: qty 1

## 2015-06-21 MED ORDER — SODIUM CHLORIDE 0.9 % IV SOLN: 250.0000 mL | INTRAVENOUS | Status: DC | PRN

## 2015-06-21 MED ORDER — HEPARIN BOLUS VIA INFUSION
4000.0000 [IU] | Freq: Once | INTRAVENOUS | Status: AC
  Administered 2015-06-21: 4000 [IU] via INTRAVENOUS
  Filled 2015-06-21: qty 4000

## 2015-06-21 MED ORDER — LISINOPRIL 5 MG PO TABS: 5.0000 mg | ORAL_TABLET | Freq: Every day | ORAL | Status: DC

## 2015-06-21 MED ORDER — ATORVASTATIN CALCIUM 80 MG PO TABS
80.0000 mg | ORAL_TABLET | Freq: Every day | ORAL | Status: DC
Start: 2015-06-21 — End: 2016-04-07

## 2015-06-21 MED ORDER — INSULIN ASPART 100 UNIT/ML ~~LOC~~ SOLN: 0.0000 [IU] | Freq: Three times a day (TID) | SUBCUTANEOUS | Status: DC

## 2015-06-21 MED ORDER — ASPIRIN EC 81 MG PO TBEC
81.0000 mg | DELAYED_RELEASE_TABLET | Freq: Every day | ORAL | Status: DC
  Administered 2015-06-21: 81 mg via ORAL
  Filled 2015-06-21: qty 1

## 2015-06-21 MED ORDER — HEPARIN (PORCINE) IN NACL 100-0.45 UNIT/ML-% IJ SOLN
1200.0000 [IU]/h | INTRAMUSCULAR | Status: DC
  Administered 2015-06-21: 1200 [IU]/h via INTRAVENOUS
  Filled 2015-06-21: qty 250

## 2015-06-21 MED ORDER — KETOROLAC TROMETHAMINE 15 MG/ML IJ SOLN
15.0000 mg | Freq: Four times a day (QID) | INTRAMUSCULAR | Status: DC | PRN
Start: 2015-06-21 — End: 2015-06-21
  Administered 2015-06-21: 15 mg via INTRAVENOUS
  Filled 2015-06-21: qty 1

## 2015-06-21 MED ORDER — NITROGLYCERIN 0.4 MG SL SUBL: 0.4000 mg | SUBLINGUAL_TABLET | SUBLINGUAL | Status: DC | PRN

## 2015-06-21 MED ORDER — POTASSIUM CHLORIDE IN NACL 20-0.9 MEQ/L-% IV SOLN
INTRAVENOUS | Status: DC
  Administered 2015-06-21: 01:00:00 via INTRAVENOUS
  Filled 2015-06-21 (×3): qty 1000

## 2015-06-21 MED ORDER — SODIUM CHLORIDE 0.9% FLUSH
3.0000 mL | Freq: Two times a day (BID) | INTRAVENOUS | Status: DC
  Administered 2015-06-21 (×2): 3 mL via INTRAVENOUS

## 2015-06-21 MED ORDER — ATORVASTATIN CALCIUM 20 MG PO TABS: 80.0000 mg | ORAL_TABLET | Freq: Every day | ORAL | Status: DC

## 2015-06-21 MED ORDER — ATORVASTATIN CALCIUM 20 MG PO TABS: 40.0000 mg | ORAL_TABLET | Freq: Every day | ORAL | Status: DC

## 2015-06-21 MED ORDER — INSULIN ASPART 100 UNIT/ML ~~LOC~~ SOLN: 0.0000 [IU] | Freq: Every day | SUBCUTANEOUS | Status: DC

## 2015-06-21 MED ORDER — METOPROLOL TARTRATE 25 MG PO TABS: 12.5000 mg | ORAL_TABLET | Freq: Two times a day (BID) | ORAL | Status: DC

## 2015-06-21 MED ORDER — PRASUGREL HCL 10 MG PO TABS: 10.0000 mg | ORAL_TABLET | Freq: Every day | ORAL | Status: DC

## 2015-06-21 NOTE — Consult Note (Signed)
Mr. Michael Schmitt 's 45 year old male with a history of depression seen in consultation for worsening of depression. Denies suicidal or homicidal ideation.  PLAN: 1. The patient doesn't meet criteria for inpatient psychiatric commitment. Please discharge is appropriate.  2. The patient is not interested in starting antidepressant. He wants to continue Clonazepam.   3. He is interested in ECT treatment but [name] is out of town.  4. Will consult note to follow.

## 2015-06-21 NOTE — Consult Note (Signed)
Reason for Consult: Chest pain angina panic attacks borderline troponin Referring Physician: Dr. Bridgett Larsson hospitalist. Michael Schmitt primary  Michael Schmitt is an 45 y.o. male.  HPI: 45 year old male known coronary disease history of myocardial infarction back in October 2016 requiring PCI and stent. Procedure was reported and agrees per his cardiologist resides. Patient states she's had some financial issues and wants to cut his Medicaid so he no longer receives financial assistance this happened sometime in late January. Patient states she is one of many of his medications since February was for at least a month he hasn't taken Effient for his stent which was placed in October and he should be on Effient for a minimum of 12 months. Patient also states he ran out of some of his pain medications for chronic neck pain. Patient states he feels depressed and hopeless. Presented to the emergency room with a panic attack which she states she's had before. No different than his myocardial infarction he complains of anxiety palpitations and rapid breathing. He states he gets them often because he sits at home alone and has no money. Patient continues to smoke states to be pain-free. He is asking for help with his finances so he can afford his medications  Past Medical History  Diagnosis Date  . Hypertension   . Mental disorder   . Anxiety   . Shortness of breath   . RLS (restless legs syndrome)     detected on sleep study  . GERD (gastroesophageal reflux disease)   . Headache(784.0)   . Blood dyscrasia     told that when he was young he was a" free bleeder"  . Arthritis     cerv. stenosis, spondylosis, HNP- lower back , has been followed in pain clinic, has  had injection s in cerv. area  . Chronic kidney disease     renal calculi- passed spontaneously  . Anxiety   . Depression   . Diabetes mellitus without complication ([place])   . Cervicogenic headache 07/24/2014  . Cervical spondylosis without  myelopathy 07/24/2014  . Fatty liver   . CAD (coronary artery disease)     Past Surgical History  Procedure Laterality Date  . Nasal sinus surgery      2005  . Anterior cervical decomp/discectomy fusion  11/13/2011    Procedure: ANTERIOR CERVICAL DECOMPRESSION/DISCECTOMY FUSION 1 LEVEL;  Surgeon: [name], MD;  Location: [facility];  Service: Neurosurgery;  Laterality: N/A;  Anterior Cervical Decompression/discectomy Fusion. Cervical three-four.  . Cardiac catheterization N/A 01/01/2015    Procedure: Left Heart Cath and Coronary Angiography;  Surgeon: [name], MD;  Location: [facility];  Service: Cardiovascular;  Laterality: N/A;    Family History  Problem Relation Age of Onset  . Prostate cancer Father   . Hypertension Mother   . Kidney Stones Mother   . COPD Sister   . Hypertension Sister   . Diabetes Sister     Social History:  reports that he has been smoking.  He has never used smokeless tobacco. He reports that he drinks alcohol. He reports that he does not use illicit drugs.  Allergies:  Allergies  Allergen Reactions  . Prednisone Other (See Comments)    Hypertension, makes him feel spacey  . Hydrocodone Other (See Comments)    Headache, irritable  . Varenicline Other (See Comments)    Suicidal thoughts    Medications: I have reviewed the patient's current medications.  Results for orders placed or performed during  the hospital encounter of 06/20/15 (from the past 48 hour(s))  Basic metabolic panel     Status: Abnormal   Collection Time: 06/20/15  5:21 PM  Result Value Ref Range   Sodium 134 (L) 135 - 145 mmol/L   Potassium 3.4 (L) 3.5 - 5.1 mmol/L   Chloride 103 101 - 111 mmol/L   CO2 23 22 - 32 mmol/L   Glucose, Bld 117 (H) 65 - 99 mg/dL   BUN 12 6 - 20 mg/dL   Creatinine, Ser 1.04 0.61 - 1.24 mg/dL   Calcium 9.4 8.9 - 10.3 mg/dL   GFR calc non Af Amer >60 >60 mL/min   GFR calc Af Amer >60 >60 mL/min    Comment: (NOTE) The eGFR has been  calculated using the CKD EPI equation. This calculation has not been validated in all clinical situations. eGFR's persistently <60 mL/min signify possible Chronic Kidney Disease.    Anion gap 8 5 - 15  CBC     Status: None   Collection Time: 06/20/15  5:21 PM  Result Value Ref Range   WBC 10.5 3.8 - 10.6 K/uL   RBC 5.15 4.40 - 5.90 MIL/uL   Hemoglobin 15.5 13.0 - 18.0 g/dL   HCT 45.4 40.0 - 52.0 %   MCV 88.2 80.0 - 100.0 fL   MCH 30.1 26.0 - 34.0 pg   MCHC 34.1 32.0 - 36.0 g/dL   RDW 14.5 11.5 - 14.5 %   Platelets 239 150 - 440 K/uL  Troponin I     Status: Abnormal   Collection Time: 06/20/15  5:21 PM  Result Value Ref Range   Troponin I 0.09 (H) <0.031 ng/mL    Comment: READ BACK AND VERIFIED WITH Velta Addison RN AT 3354 06/20/15 MSS.        PERSISTENTLY INCREASED TROPONIN VALUES IN THE RANGE OF 0.04-0.49 ng/mL CAN BE SEEN IN:       -UNSTABLE ANGINA       -CONGESTIVE HEART FAILURE       -MYOCARDITIS       -CHEST TRAUMA       -ARRYHTHMIAS       -LATE PRESENTING MYOCARDIAL INFARCTION       -COPD   CLINICAL FOLLOW-UP RECOMMENDED.   Troponin I     Status: Abnormal   Collection Time: 06/20/15  7:20 PM  Result Value Ref Range   Troponin I 0.12 (H) <0.031 ng/mL    Comment: PREVIOUS RESULT CALLED AT 1745 06/20/15.PMH        PERSISTENTLY INCREASED TROPONIN VALUES IN THE RANGE OF 0.04-0.49 ng/mL CAN BE SEEN IN:       -UNSTABLE ANGINA       -CONGESTIVE HEART FAILURE       -MYOCARDITIS       -CHEST TRAUMA       -ARRYHTHMIAS       -LATE PRESENTING MYOCARDIAL INFARCTION       -COPD   CLINICAL FOLLOW-UP RECOMMENDED.   Protime-INR     Status: None   Collection Time: 06/20/15 11:42 PM  Result Value Ref Range   Prothrombin Time 13.1 11.4 - 15.0 seconds   INR 0.97   APTT     Status: None   Collection Time: 06/20/15 11:42 PM  Result Value Ref Range   aPTT 32 24 - 36 seconds  Troponin I     Status: Abnormal   Collection Time: 06/21/15 12:16 AM  Result Value Ref Range    Troponin I 0.10 (H) <  0.031 ng/mL    Comment: PREVIOUS RESULT CALLED BY MSS @ 3151 ON 06/20/2015 [facility]        PERSISTENTLY INCREASED TROPONIN VALUES IN THE RANGE OF 0.04-0.49 ng/mL CAN BE SEEN IN:       -UNSTABLE ANGINA       -CONGESTIVE HEART FAILURE       -MYOCARDITIS       -CHEST TRAUMA       -ARRYHTHMIAS       -LATE PRESENTING MYOCARDIAL INFARCTION       -COPD   CLINICAL FOLLOW-UP RECOMMENDED.   Glucose, capillary     Status: None   Collection Time: 06/21/15 12:29 AM  Result Value Ref Range   Glucose-Capillary 95 65 - 99 mg/dL  Heparin level (unfractionated)     Status: None   Collection Time: 06/21/15  7:02 AM  Result Value Ref Range   Heparin Unfractionated 0.48 0.30 - 0.70 IU/mL    Comment:        IF HEPARIN RESULTS ARE BELOW EXPECTED VALUES, AND PATIENT DOSAGE HAS BEEN CONFIRMED, SUGGEST FOLLOW UP TESTING OF ANTITHROMBIN III LEVELS.   CBC     Status: None   Collection Time: 06/21/15  7:02 AM  Result Value Ref Range   WBC 8.4 3.8 - 10.6 K/uL   RBC 4.70 4.40 - 5.90 MIL/uL   Hemoglobin 14.2 13.0 - 18.0 g/dL   HCT 41.8 40.0 - 52.0 %   MCV 89.0 80.0 - 100.0 fL   MCH 30.2 26.0 - 34.0 pg   MCHC 34.0 32.0 - 36.0 g/dL   RDW 14.4 11.5 - 14.5 %   Platelets 198 150 - 440 K/uL  Glucose, capillary     Status: Abnormal   Collection Time: 06/21/15  7:28 AM  Result Value Ref Range   Glucose-Capillary 118 (H) 65 - 99 mg/dL  Glucose, capillary     Status: Abnormal   Collection Time: 06/21/15 11:34 AM  Result Value Ref Range   Glucose-Capillary 107 (H) 65 - 99 mg/dL    Dg Chest 2 View  06/20/2015  CLINICAL DATA:  Chest pain, history of MI, heart racing EXAM: CHEST  2 VIEW COMPARISON:  09/09/2013 FINDINGS: Cardiomediastinal silhouette is stable. No acute infiltrate or pleural effusion. No pulmonary edema. Bony thorax is unremarkable. IMPRESSION: No active cardiopulmonary disease. Electronically Signed   By: Lahoma Crocker M.D.   On: 06/20/2015 17:48    Review of Systems   Constitutional: Positive for malaise/fatigue and diaphoresis.  HENT: Positive for congestion.   Eyes: Negative.   Respiratory: Positive for shortness of breath.   Cardiovascular: Positive for chest pain and palpitations.  Gastrointestinal: Negative.   Genitourinary: Negative.   Musculoskeletal: Negative.   Skin: Negative.   Neurological: Positive for dizziness and weakness.  Endo/Heme/Allergies: Negative.   Psychiatric/Behavioral: Positive for depression. The patient is nervous/anxious.    Blood pressure 132/76, pulse 65, temperature 98.2 F (36.8 C), temperature source Oral, resp. rate 18, height 6' 2" (1.88 m), weight 89.132 kg (196 lb 8 oz), SpO2 96 %. Physical Exam  Assessment/Plan: Chest pain possible angina Borderline troponins Coronary disease Panic attack anxiety Chronic pain from arthritis in neck Smoking Depression Shortness of breath Restless leg syndrome Hypertension Diabetes . PLAN Agree with admit for rule out myocardial infarction Follow-up troponins which were basically flat Probably represents demand ischemia This does not represent a non-STEMI in Opinion History of PCI and stent would recommend reinstitution Eliquis or Plavix to complete 12 months Aspirin therapy for appears  cardiovascular disease Advised patient quit smoking Noncompliance because of financial issues Continue pain control with Pain clinic I do not recommend any invasive studies at this point Recommend medical therapy DVT prophylaxis Consider treatment for panic attacks and anxiety. Psychological counseling Have the patient follow-up with cardiology as an outpatient   , D. 06/21/2015, 1:54 PM

## 2015-06-21 NOTE — Care Management Note (Signed)
Case Management Note  Patient Details  Name: Michael Schmitt MRN: [id number] Date of Birth: 1971-02-16  Subjective/Objective:  CM consult for medication assistance. Met with patient at bedside. Patient states he lives at home with his children and uses no assistive devices. He had medicaid until Jan. 31, 2017 when his disability started and they then told him he made too much.  Gave patient applications to [facility]. Explained both applications. Patient stated he had heard of the "[facility]" and " they are hard to get an appointment with and can't help me with my narcotics".  He wanted to pay the $75 to see his normal PCP, Dr. Humphrey Rolls.  During our conversation, patient was c/o severe headache, depression and anxiety. He states he never leaves the house due to his depression. Encouraged patient to get connected with [facility] psychiatrics and pain management to get help with his pain and depression. He is on disability due to back and neck nerve damage. He then said he had no way to drive there because he doesn't drive that far. Offered transportation options and he refused. With each option CM offered the patient was negative, had some reason he couldn't do it.                    Action/Plan: Case closed  Expected Discharge Date:                  Expected Discharge Plan:  Home/Self Care  In-House Referral:     Discharge planning Services  CM Consult, Medication Assistance, [facility]  Post Acute Care Choice:    Choice offered to:  Patient  DME Arranged:    DME Agency:     HH Arranged:    [place] Agency:     Status of Service:  Completed, signed off  Medicare Important Message Given:    Date Medicare IM Given:    Medicare IM give by:    Date Additional Medicare IM Given:    Additional Medicare Important Message give by:     If discussed at [place] of Stay Meetings, dates discussed:    Additional Comments:  Jolly Mango, RN 06/21/2015, 11:49 AM

## 2015-06-21 NOTE — Progress Notes (Signed)
MD Michael Schmitt was asked about doing cath/ stress test. Order was to feed will not proceed with procedure at this time.

## 2015-06-21 NOTE — Progress Notes (Signed)
ANTICOAGULATION CONSULT NOTE - follow up [place] for heparin drip Indication: ACS/STEMI  Allergies  Allergen Reactions  . Prednisone Other (See Comments)    Hypertension, makes him feel spacey  . Hydrocodone Other (See Comments)    Headache, irritable  . Varenicline Other (See Comments)    Suicidal thoughts    Patient Measurements: Height: 6\' 2"  (188 cm) Weight: 196 lb 8 oz (89.132 kg) IBW/kg (Calculated) : 82.2 Heparin Dosing Weight: 89.1 kg  Vital Signs: Temp: 97.8 F (36.6 C) (03/27 0537) Temp Source: Oral (03/27 0537) BP: 113/62 mmHg (03/27 0537) Pulse Rate: 56 (03/27 0537)  Labs:  Recent Labs  06/20/15 1721 06/20/15 1920 06/20/15 2342 06/21/15 0016 06/21/15 0702  HGB 15.5  --   --   --  14.2  HCT 45.4  --   --   --  41.8  PLT 239  --   --   --  198  APTT  --   --  32  --   --   LABPROT  --   --  13.1  --   --   INR  --   --  0.97  --   --   HEPARINUNFRC  --   --   --   --  0.48  CREATININE 1.04  --   --   --   --   TROPONINI 0.09* 0.12*  --  0.10*  --     Estimated Creatinine Clearance: 107.6 mL/min (by C-G formula based on Cr of 1.04).     Assessment: 45 yo male here with chest pain on heparin drip for possible NSTEMI  INR 0.97   aPTT 32 3/26 Hgb 15.5, Plt 239 3/27 Hgb 14.2, Plt 198  No anticoagulation as outpatient in med rec.  RN states heparin drip running at 12 ml/hr; no line issues or s/sx of bleeding noted  Goal of Therapy:  Heparin level 0.3-0.7 units/ml Monitor platelets by anticoagulation protocol: Yes   Plan:  First anti-Xa level therapeutic at 0.48. Will continue heparin drip at current rate.  Heparin level in 6h to confirm CBC in AM  Pharmacy will continue to follow  Rayna Sexton L 06/21/2015,7:48 AM

## 2015-06-21 NOTE — Consult Note (Signed)
[facility] Face-to-Face Psychiatry Consult   Reason for Consult:  Depression. Referring Physician:  Dr. Bettey Costa Patient Identification: Michael Schmitt MRN:  [id number] Principal Diagnosis: NSTEMI (non-ST elevated myocardial infarction) [facility]) Diagnosis:   Patient Active Problem List   Diagnosis Date Noted  . NSTEMI (non-ST elevated myocardial infarction) ([place]) [I21.4] 06/20/2015  . STEMI (ST elevation myocardial infarction) ([place]) [I21.3] 01/01/2015  . Acute MI, inferoposterior wall ([place]) [I21.19] 01/01/2015  . Common bile duct dilation [K83.8] 10/06/2014  . Right lower quadrant abdominal pain [R10.31] 10/06/2014  . Loss of weight [R63.4] 10/06/2014  . Cervicogenic headache [R51] 07/24/2014  . Cervical spondylosis without myelopathy [M47.812] 07/24/2014    Total Time spent with patient: 1 hour  Subjective:    Identifying data. Michael Schmitt is a 45 year old male with a history of depression.  Chief complaint. "I need ECT."  History of present illness. Information was obtained from the patient and the chart. The patient has a long history of depression and according to his own account he has been tried on every antidepressant known. He does not believe that they were ever helpful. During his last hospitalization at [facility] he was offered ECT treatment. He received one treatment as an inpatient and was supposed to continue it on outpatient basis. Unfortunately he did not have transportation and stop the treatment. He was admitted to a medical floor for chest pain and a cardiac event was ruled out. The patient now inquires about ECT again. He complains of severe depression with poor sleep, decreased appetite, anhedonia, feeling of guilt and hopelessness worthlessness, poor energy and concentration, crying spells and social isolation. He denies thoughts of suicide any intentions or plans. He sometimes feels like people would be better without him. He is not actively  suicidal or homicidal at the moment. She denies psychotic symptoms or symptoms suggestive of bipolar mania. He reports some anxiety for which she takes clonazepam with much improvement. He denies alcohol or illicit substance use.  Past psychiatric history. There were several inpatient hospitalizations for depression and an attempt to treat depression with ECT. 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 He denies ever attempting suicide.  Family psychiatric history. None reported.  Social history. He is a single father of 3 children. He is disabled and has multiple medical conditions including chronic pain. He lost Medicaid and its difficult for him to obtain proper care that involves pain management.  Risk to Self: Suicidal Ideation: No Suicidal Intent: No Is patient at risk for suicide?: No Suicidal Plan?: No Access to Means: No What has been your use of drugs/alcohol within the last 12 months?: None How many times?: 0 Other Self Harm Risks: Unknown  Triggers for Past Attempts: Other (Comment) (Pain) Intentional Self Injurious Behavior: None Risk to Others: Homicidal Ideation: No Thoughts of Harm to Others: No Current Homicidal Intent: No Current Homicidal Plan: No Access to Homicidal Means: No Identified Victim: N/A History of harm to others?: No Assessment of Violence: None Noted Violent Behavior Description: N/A Does patient have access to weapons?: No Criminal Charges Pending?: No Does patient have a court date: No Prior Inpatient Therapy: Prior Inpatient Therapy: Yes Prior Therapy Dates: 2015 Prior Therapy Facilty/Provider(s): [facility] Reason for Treatment: Derpression  Prior Outpatient Therapy: Prior Outpatient Therapy: Yes Prior Therapy Dates: Ended 03/2015 (Due to insurace loss) Prior Therapy Facilty/Provider(s): [facility] Reason for Treatment: Depression and Anxiety  Does patient have an ACCT team?: No Does patient have Intensive In-House Services?  : No  Does  patient have [facility] services? : No Does patient have P4CC services?: No  Past Medical History:  Past Medical History  Diagnosis Date  . Hypertension   . Mental disorder   . Anxiety   . Shortness of breath   . RLS (restless legs syndrome)     detected on sleep study  . GERD (gastroesophageal reflux disease)   . Headache(784.0)   . Blood dyscrasia     told that when he was young he was a" free bleeder"  . Arthritis     cerv. stenosis, spondylosis, HNP- lower back , has been followed in pain clinic, has  had injection s in cerv. area  . Chronic kidney disease     renal calculi- passed spontaneously  . Anxiety   . Depression   . Diabetes mellitus without complication ([place])   . Cervicogenic headache 07/24/2014  . Cervical spondylosis without myelopathy 07/24/2014  . Fatty liver   . CAD (coronary artery disease)     Past Surgical History  Procedure Laterality Date  . Nasal sinus surgery      2005  . Anterior cervical decomp/discectomy fusion  11/13/2011    Procedure: ANTERIOR CERVICAL DECOMPRESSION/DISCECTOMY FUSION 1 LEVEL;  Surgeon: [name], MD;  Location: [place] NEURO ORS;  Service: Neurosurgery;  Laterality: N/A;  Anterior Cervical Decompression/discectomy Fusion. Cervical three-four.  . Cardiac catheterization N/A 01/01/2015    Procedure: Left Heart Cath and Coronary Angiography;  Surgeon: [name], MD;  Location: [place] CV LAB;  Service: Cardiovascular;  Laterality: N/A;   Family History:  Family History  Problem Relation Age of Onset  . Prostate cancer Father   . Hypertension Mother   . Kidney Stones Mother   . COPD Sister   . Hypertension Sister   . Diabetes Sister    Family Psychiatric  History: None reported. Social History:  History  Alcohol Use  . Yes    Comment: beer- 1-2 x's per month      History  Drug Use No    Social History   Social History  . Marital Status: Single    Spouse Name: N/A  . Number of Children: 3  . Years of Education: 12    Occupational History  .      unemployed   Social History Main Topics  . Smoking status: Current Every Day Smoker -- 1.50 packs/day for 17 years  . Smokeless tobacco: Never Used  . Alcohol Use: Yes     Comment: beer- 1-2 x's per month   . Drug Use: No  . Sexual Activity: Not Asked   Other Topics Concern  . None   Social History Narrative   Patient is right handed.   Patient drinks 2 sodas daily.   Additional Social History:    Allergies:   Allergies  Allergen Reactions  . Prednisone Other (See Comments)    Hypertension, makes him feel spacey  . Hydrocodone Other (See Comments)    Headache, irritable  . Varenicline Other (See Comments)    Suicidal thoughts    Labs:  Results for orders placed or performed during the hospital encounter of 06/20/15 (from the past 48 hour(s))  Basic metabolic panel     Status: Abnormal   Collection Time: 06/20/15  5:21 PM  Result Value Ref Range   Sodium 134 (L) 135 - 145 mmol/L   Potassium 3.4 (L) 3.5 - 5.1 mmol/L   Chloride 103 101 - 111 mmol/L   CO2 23 22 - 32  mmol/L   Glucose, Bld 117 (H) 65 - 99 mg/dL   BUN 12 6 - 20 mg/dL   Creatinine, Ser 1.04 0.61 - 1.24 mg/dL   Calcium 9.4 8.9 - 10.3 mg/dL   GFR calc non Af Amer >60 >60 mL/min   GFR calc Af Amer >60 >60 mL/min    Comment: (NOTE) The eGFR has been calculated using the CKD EPI equation. This calculation has not been validated in all clinical situations. eGFR's persistently <60 mL/min signify possible Chronic Kidney Disease.    Anion gap 8 5 - 15  CBC     Status: None   Collection Time: 06/20/15  5:21 PM  Result Value Ref Range   WBC 10.5 3.8 - 10.6 K/uL   RBC 5.15 4.40 - 5.90 MIL/uL   Hemoglobin 15.5 13.0 - 18.0 g/dL   HCT 45.4 40.0 - 52.0 %   MCV 88.2 80.0 - 100.0 fL   MCH 30.1 26.0 - 34.0 pg   MCHC 34.1 32.0 - 36.0 g/dL   RDW 14.5 11.5 - 14.5 %   Platelets 239 150 - 440 K/uL  Troponin I     Status: Abnormal   Collection Time: 06/20/15  5:21 PM  Result Value  Ref Range   Troponin I 0.09 (H) <0.031 ng/mL    Comment: READ BACK AND VERIFIED WITH Velta Addison RN AT 8280 06/20/15 MSS.        PERSISTENTLY INCREASED TROPONIN VALUES IN THE RANGE OF 0.04-0.49 ng/mL CAN BE SEEN IN:       -UNSTABLE ANGINA       -CONGESTIVE HEART FAILURE       -MYOCARDITIS       -CHEST TRAUMA       -ARRYHTHMIAS       -LATE PRESENTING MYOCARDIAL INFARCTION       -COPD   CLINICAL FOLLOW-UP RECOMMENDED.   Troponin I     Status: Abnormal   Collection Time: 06/20/15  7:20 PM  Result Value Ref Range   Troponin I 0.12 (H) <0.031 ng/mL    Comment: PREVIOUS RESULT CALLED AT 1745 06/20/15.PMH        PERSISTENTLY INCREASED TROPONIN VALUES IN THE RANGE OF 0.04-0.49 ng/mL CAN BE SEEN IN:       -UNSTABLE ANGINA       -CONGESTIVE HEART FAILURE       -MYOCARDITIS       -CHEST TRAUMA       -ARRYHTHMIAS       -LATE PRESENTING MYOCARDIAL INFARCTION       -COPD   CLINICAL FOLLOW-UP RECOMMENDED.   Protime-INR     Status: None   Collection Time: 06/20/15 11:42 PM  Result Value Ref Range   Prothrombin Time 13.1 11.4 - 15.0 seconds   INR 0.97   APTT     Status: None   Collection Time: 06/20/15 11:42 PM  Result Value Ref Range   aPTT 32 24 - 36 seconds  Troponin I     Status: Abnormal   Collection Time: 06/21/15 12:16 AM  Result Value Ref Range   Troponin I 0.10 (H) <0.031 ng/mL    Comment: PREVIOUS RESULT CALLED BY MSS @ 0349 ON 06/20/2015 [facility]        PERSISTENTLY INCREASED TROPONIN VALUES IN THE RANGE OF 0.04-0.49 ng/mL CAN BE SEEN IN:       -UNSTABLE ANGINA       -CONGESTIVE HEART FAILURE       -MYOCARDITIS       -  CHEST TRAUMA       -ARRYHTHMIAS       -LATE PRESENTING MYOCARDIAL INFARCTION       -COPD   CLINICAL FOLLOW-UP RECOMMENDED.   Glucose, capillary     Status: None   Collection Time: 06/21/15 12:29 AM  Result Value Ref Range   Glucose-Capillary 95 65 - 99 mg/dL  Heparin level (unfractionated)     Status: None   Collection Time: 06/21/15  7:02 AM   Result Value Ref Range   Heparin Unfractionated 0.48 0.30 - 0.70 IU/mL    Comment:        IF HEPARIN RESULTS ARE BELOW EXPECTED VALUES, AND PATIENT DOSAGE HAS BEEN CONFIRMED, SUGGEST FOLLOW UP TESTING OF ANTITHROMBIN III LEVELS.   CBC     Status: None   Collection Time: 06/21/15  7:02 AM  Result Value Ref Range   WBC 8.4 3.8 - 10.6 K/uL   RBC 4.70 4.40 - 5.90 MIL/uL   Hemoglobin 14.2 13.0 - 18.0 g/dL   HCT 41.8 40.0 - 52.0 %   MCV 89.0 80.0 - 100.0 fL   MCH 30.2 26.0 - 34.0 pg   MCHC 34.0 32.0 - 36.0 g/dL   RDW 14.4 11.5 - 14.5 %   Platelets 198 150 - 440 K/uL  Glucose, capillary     Status: Abnormal   Collection Time: 06/21/15  7:28 AM  Result Value Ref Range   Glucose-Capillary 118 (H) 65 - 99 mg/dL  Glucose, capillary     Status: Abnormal   Collection Time: 06/21/15 11:34 AM  Result Value Ref Range   Glucose-Capillary 107 (H) 65 - 99 mg/dL    Current Facility-Administered Medications  Medication Dose Route Frequency Provider Last Rate Last Dose  . 0.9 %  sodium chloride infusion  250 mL Intravenous PRN [name], MD      . 0.9 %  sodium chloride infusion   Intravenous Continuous [name], MD   Stopped at 06/21/15 0105  . 0.9 % NaCl with KCl 20 mEq/ L  infusion   Intravenous Continuous [name], MD   Stopped at 06/21/15 1133  . acetaminophen (TYLENOL) tablet 650 mg  650 mg Oral Q4H PRN [name], MD      . ALPRAZolam Duanne Moron) tablet 0.25 mg  0.25 mg Oral BID PRN [name], MD   0.25 mg at 06/20/15 2241  . aspirin EC tablet 81 mg  81 mg Oral Daily [name], MD   81 mg at 06/21/15 0946  . atorvastatin (LIPITOR) tablet 80 mg  80 mg Oral q1800 [name], MD      . clonazePAM [facility]) tablet 1 mg  1 mg Oral TID [name], MD   1 mg at 06/21/15 0946  . heparin ADULT infusion 100 units/mL (25000 units/250 mL)  1,200 Units/hr Intravenous Continuous [name], MD   Stopped at 06/21/15 1133  . insulin aspart (novoLOG) injection 0-5 Units  0-5 Units Subcutaneous QHS [name],  MD   0 Units at 06/21/15 0015  . insulin aspart (novoLOG) injection 0-9 Units  0-9 Units Subcutaneous TID WC [name], MD   0 Units at 06/21/15 0800  . ketorolac (TORADOL) 15 MG/ML injection 15 mg  15 mg Intravenous Q6H PRN Bettey Costa, MD   15 mg at 06/21/15 0816  . lisinopril (PRINIVIL,ZESTRIL) tablet 5 mg  5 mg Oral QHS PRN [name], MD      . loratadine (CLARITIN) tablet 10 mg  10 mg Oral Daily [name],  MD   10 mg at 06/21/15 1000  . metoprolol tartrate (LOPRESSOR) tablet 12.5 mg  12.5 mg Oral BID [name], MD   Stopped at 06/21/15 1000  . nitroGLYCERIN (NITROSTAT) SL tablet 0.4 mg  0.4 mg Sublingual Q5 Min x 3 PRN [name], MD      . ondansetron [facility]) injection 4 mg  4 mg Intravenous Q6H PRN [name], MD   4 mg at 06/21/15 0946  . pantoprazole (PROTONIX) EC tablet 40 mg  40 mg Oral Daily [name], MD   40 mg at 06/21/15 0946  . prasugrel (EFFIENT) tablet 10 mg  10 mg Oral Daily [name], MD   10 mg at 06/21/15 0946  . pregabalin (LYRICA) capsule 75 mg  75 mg Oral TID [name], MD   75 mg at 06/21/15 0946  . sodium chloride flush (NS) 0.9 % injection 3 mL  3 mL Intravenous Q12H [name], MD   3 mL at 06/21/15 1000  . sodium chloride flush (NS) 0.9 % injection 3 mL  3 mL Intravenous PRN [name], MD      . tiZANidine (ZANAFLEX) tablet 4 mg  4 mg Oral QHS [name], MD   4 mg at 06/20/15 2251   Current Outpatient Prescriptions  Medication Sig Dispense Refill  . aspirin 81 MG chewable tablet Chew 1 tablet (81 mg total) by mouth daily. 30 tablet 3  . clonazePAM (KLONOPIN) 1 MG tablet Take 1 mg by mouth 3 (three) times daily.     Marland Kitchen atorvastatin (LIPITOR) 80 MG tablet Take 1 tablet (80 mg total) by mouth daily at 6 PM. 30 tablet 3  . lisinopril (PRINIVIL,ZESTRIL) 5 MG tablet Take 1 tablet (5 mg total) by mouth daily. 30 tablet 0  . metoprolol tartrate (LOPRESSOR) 25 MG tablet Take 0.5 tablets (12.5 mg total) by mouth 2 (two) times daily. 30 tablet 3  . nitroGLYCERIN (NITROSTAT) 0.4  MG SL tablet Place 1 tablet (0.4 mg total) under the tongue every 5 (five) minutes x 3 doses as needed for chest pain. 25 tablet 12  . prasugrel (EFFIENT) 10 MG TABS tablet Take 1 tablet (10 mg total) by mouth daily. 30 tablet 11    Musculoskeletal: Strength & Muscle Tone: within normal limits Gait & Station: normal Patient leans: N/A  Psychiatric Specialty Exam: Review of Systems  Psychiatric/Behavioral: Positive for depression.  All other systems reviewed and are negative.   Blood pressure 132/76, pulse 65, temperature 98.2 F (36.8 C), temperature source Oral, resp. rate 18, height _0  (1.88 m), weight 89.132 kg (196 lb 8 oz), SpO2 96 %.Body mass index is 25.22 kg/(m^2).  General Appearance: Casual  Eye Contact::  Good  Speech:  Clear and Coherent  Volume:  Normal  Mood:  Anxious  Affect:  Appropriate  Thought Process:  Goal Directed  Orientation:  Full (Time, Place, and Person)  Thought Content:  WDL  Suicidal Thoughts:  No  Homicidal Thoughts:  No  Memory:  Immediate;   Fair Recent;   Fair Remote;   Fair  Judgement:  Fair  Insight:  Shallow  Psychomotor Activity:  Normal  Concentration:  Fair  Recall:  [place]  Language: Fair  Akathisia:  No  Handed:  Right  AIMS (if indicated):     Assets:  Communication Skills Desire for Improvement Resilience Social Support  ADL's:  Intact  Cognition: WNL  Sleep:      Treatment Plan Summary: Daily contact  with patient to assess and evaluate symptoms and progress in treatment and Medication management  Disposition: No evidence of imminent risk to self or others at present.   Patient does not meet criteria for psychiatric inpatient admission. Supportive therapy provided about ongoing stressors. Discussed crisis plan, support from social network, calling 911, coming to the Emergency Department, and calling Suicide Hotline.   PLAN: 1. The patient doesn't meet criteria for inpatient psychiatric  commitment. Please discharge is appropriate.  2. The patient is not interested in starting antidepressant. He wants to continue Clonazepam.   3. He is interested in ECT treatment but [name] is out of town.   Orson Slick, MD 06/21/2015 4:46 PM

## 2015-06-21 NOTE — Progress Notes (Signed)
A & O. Complained of pain and received meds. Takes meds ok. Ambulated to the BR and tolerarted it well. IV and tele removed. Discharge instructions given to pt. Med assist application was given pt states he can not afford that. Lattie Haw was notified.Pt has no further concerns at this time.

## 2015-06-21 NOTE — Progress Notes (Addendum)
ANTICOAGULATION CONSULT NOTE - Initial Consult  Pharmacy Consult for he;arin drip Indication: ACS/STEMI  Allergies  Allergen Reactions  . Prednisone Other (See Comments)    Hypertension, makes him feel spacey  . Hydrocodone Other (See Comments)    Headache, irritable  . Varenicline Other (See Comments)    Suicidal thoughts    Patient Measurements: Height: 6\' 2"  (188 cm) Weight: 204 lb (92.534 kg) IBW/kg (Calculated) : 82.2 Heparin Dosing Weight: 92.5kg  Vital Signs: Temp: 98.2 F (36.8 C) (03/26 1718) Temp Source: Oral (03/26 1718) BP: 124/69 mmHg (03/26 2330) Pulse Rate: 57 (03/26 2330)  Labs:  Recent Labs  06/20/15 1721 06/20/15 1920  HGB 15.5  --   HCT 45.4  --   PLT 239  --   CREATININE 1.04  --   TROPONINI 0.09* 0.12*    Estimated Creatinine Clearance: 107.6 mL/min (by C-G formula based on Cr of 1.04).   Medical History: Past Medical History  Diagnosis Date  . Hypertension   . Mental disorder   . Anxiety   . Shortness of breath   . RLS (restless legs syndrome)     detected on sleep study  . GERD (gastroesophageal reflux disease)   . Headache(784.0)   . Blood dyscrasia     told that when he was young he was a" free bleeder"  . Arthritis     cerv. stenosis, spondylosis, HNP- lower back , has been followed in pain clinic, has  had injection s in cerv. area  . Chronic kidney disease     renal calculi- passed spontaneously  . Anxiety   . Depression   . Diabetes mellitus without complication ([place])   . Cervicogenic headache 07/24/2014  . Cervical spondylosis without myelopathy 07/24/2014  . Fatty liver     Medications:    Assessment: INR 0.92   aPTT 32 No anticoagulation as outpatient in med rec.  Goal of Therapy:  Heparin level 0.3-0.7 units/ml Monitor platelets by anticoagulation protocol: Yes   Plan:  4000 unit bolus and initial rate of 1200 units/hr. First anti-Xa 6 hours after start of infusion.  Michael Schmitt S 06/21/2015,12:04  AM

## 2015-06-21 NOTE — Progress Notes (Signed)
[facility] [place] NAME: Michael Schmitt    MR#:  [id number]  DATE OF BIRTH:  11/19/1970  SUBJECTIVE:  Patient without chest pain thinks it was anxiety related  REVIEW OF SYSTEMS:    Review of Systems  Constitutional: Negative for fever, chills and malaise/fatigue.  HENT: Negative for ear discharge, ear pain, hearing loss, nosebleeds and sore throat.   Eyes: Negative for blurred vision and pain.  Respiratory: Negative for cough, hemoptysis, shortness of breath and wheezing.   Cardiovascular: Negative for chest pain, palpitations and leg swelling.  Gastrointestinal: Negative for nausea, vomiting, abdominal pain, diarrhea and blood in stool.  Genitourinary: Negative for dysuria.  Musculoskeletal: Negative for back pain.  Neurological: Negative for dizziness, tremors, speech change, focal weakness, seizures and headaches.  Endo/Heme/Allergies: Does not bruise/bleed easily.  Psychiatric/Behavioral: Negative for depression, suicidal ideas and hallucinations. The patient is nervous/anxious.     Tolerating Diet: yes      DRUG ALLERGIES:   Allergies  Allergen Reactions  . Prednisone Other (See Comments)    Hypertension, makes him feel spacey  . Hydrocodone Other (See Comments)    Headache, irritable  . Varenicline Other (See Comments)    Suicidal thoughts    VITALS:  Blood pressure 113/62, pulse 56, temperature 97.8 F (36.6 C), temperature source Oral, resp. rate 18, height 6\' 2"  (1.88 m), weight 89.132 kg (196 lb 8 oz), SpO2 97 %.  PHYSICAL EXAMINATION:   Physical Exam  Constitutional: He is oriented to person, place, and time and well-developed, well-nourished, and in no distress. No distress.  HENT:  Head: Normocephalic.  Eyes: No scleral icterus.  Neck: Normal range of motion. Neck supple. No JVD present. No tracheal deviation present.  Cardiovascular: Normal rate, regular rhythm and normal heart sounds.  Exam  reveals no gallop and no friction rub.   No murmur heard. Pulmonary/Chest: Effort normal and breath sounds normal. No respiratory distress. He has no wheezes. He has no rales. He exhibits no tenderness.  Abdominal: Soft. Bowel sounds are normal. He exhibits no distension and no mass. There is no tenderness. There is no rebound and no guarding.  Musculoskeletal: Normal range of motion. He exhibits no edema.  Neurological: He is alert and oriented to person, place, and time.  Skin: Skin is warm. No rash noted. No erythema.  Psychiatric: Affect and judgment normal.      LABORATORY PANEL:   CBC  Recent Labs Lab 06/21/15 0702  WBC 8.4  HGB 14.2  HCT 41.8  PLT 198   ------------------------------------------------------------------------------------------------------------------  Chemistries   Recent Labs Lab 06/20/15 1721  NA 134*  K 3.4*  CL 103  CO2 23  GLUCOSE 117*  BUN 12  CREATININE 1.04  CALCIUM 9.4   ------------------------------------------------------------------------------------------------------------------  Cardiac Enzymes  Recent Labs Lab 06/20/15 1721 06/20/15 1920 06/21/15 0016  TROPONINI 0.09* 0.12* 0.10*   ------------------------------------------------------------------------------------------------------------------  RADIOLOGY:  Dg Chest 2 View  06/20/2015  CLINICAL DATA:  Chest pain, history of MI, heart racing EXAM: CHEST  2 VIEW COMPARISON:  09/09/2013 FINDINGS: Cardiomediastinal silhouette is stable. No acute infiltrate or pleural effusion. No pulmonary edema. Bony thorax is unremarkable. IMPRESSION: No active cardiopulmonary disease. Electronically Signed   By: Lahoma Crocker M.D.   On: 06/20/2015 17:48     ASSESSMENT AND PLAN:    45 year old male with a history of diabetes and CAD with 3 stents in October who presents with chest pain and due to insurance expiring has not  been on any medication since the end of January.   1. Chest  pain with elevated troponin: Does not appear to be non-ST elevation MI. Patient was restarted on his outpatient medications.Cardiology consult pending. Case management consult.  2. Anxiety and depression: Patient was on clonazepam.  3. Tobacco dependence: Patient was counseled by admitting physician and myself to stop smoking.  4. Essential hypertension: Continue metoprolol and lisinopril.   D/w dr Clayborn Bigness  Management plans discussed with the patient and he is in agreement.  CODE STATUS: FULL  TOTAL TIME TAKING CARE OF THIS PATIENT: 30 minutes.     POSSIBLE D/C today or tomorrow, DEPENDING ON CLINICAL CONDITION.   Juanda Luba M.D on 06/21/2015 at 11:00 AM  Between 7am to 6pm - Pager - [phone] After 6pm go to [email or web address] - password [email or web address] [facility]  Office  [phone]  CC: Primary care physician; [name], MD  Note: This dictation was prepared with Dragon dictation along with smaller phrase technology. Any transcriptional errors that result from this process are unintentional.

## 2015-06-21 NOTE — Discharge Summary (Signed)
[facility] NAME: Michael Schmitt    MR#:  [id number]  DATE OF BIRTH:  Aug 27, 1970  DATE OF ADMISSION:  06/20/2015 ADMITTING PHYSICIAN: [name], MD  DATE OF DISCHARGE: 06/21/2015  2:48 PM  PRIMARY CARE PHYSICIAN: [name], MD    ADMISSION DIAGNOSIS:  Anxiety [F41.9] Depression [F32.9] Elevated troponin I level [R79.89]  DISCHARGE DIAGNOSIS:  Principal Problem:   NSTEMI (non-ST elevated myocardial infarction) ([place])   SECONDARY DIAGNOSIS:   Past Medical History  Diagnosis Date  . Hypertension   . Mental disorder   . Anxiety   . Shortness of breath   . RLS (restless legs syndrome)     detected on sleep study  . GERD (gastroesophageal reflux disease)   . Headache(784.0)   . Blood dyscrasia     told that when he was young he was a" free bleeder"  . Arthritis     cerv. stenosis, spondylosis, HNP- lower back , has been followed in pain clinic, has  had injection s in cerv. area  . Chronic kidney disease     renal calculi- passed spontaneously  . Anxiety   . Depression   . Diabetes mellitus without complication ([place])   . Cervicogenic headache 07/24/2014  . Cervical spondylosis without myelopathy 07/24/2014  . Fatty liver   . CAD (coronary artery disease)     HOSPITAL COURSE:  45 year old male with a history of diabetes and CAD with 3 stents in October who presents with chest pain and due to insurance expiring has not been on any medication since the end of January.   1. Chest pain with elevated troponin with hX ASCVD/stents: This was due to demnad ischemia not ACS.  Patient was restarted on his outpatient medications.Cardiology consult did not recommend further investigation as his chest pain was atypical in nature and troponins were flat and EKG did not show ST/T changes. Case management was consulted for discharge planning  2. Anxiety and depression: Patient was on clonazepam.  3. Tobacco dependence:  Patient was counseled by admitting physician and myself to stop smoking.  4. Essential hypertension: Continue metoprolol and lisinopril. 5. Depression: He was seen and evaluated by Psychiatrist. He doed not meet IP Psych hospital stay. He will follow up for possible ECT in the future.  DISCHARGE CONDITIONS AND DIET:  Stable for discharge Cardiac diet  CONSULTS OBTAINED:  Treatment Team:  Yolonda Kida, MD Clovis Fredrickson, MD  DRUG ALLERGIES:   Allergies  Allergen Reactions  . Prednisone Other (See Comments)    Hypertension, makes him feel spacey  . Hydrocodone Other (See Comments)    Headache, irritable  . Varenicline Other (See Comments)    Suicidal thoughts    DISCHARGE MEDICATIONS:   Discharge Medication List as of 06/21/2015 12:18 PM    CONTINUE these medications which have CHANGED   Details  atorvastatin (LIPITOR) 80 MG tablet Take 1 tablet (80 mg total) by mouth daily at 6 PM., Starting 06/21/2015, Until Discontinued, Normal    lisinopril (PRINIVIL,ZESTRIL) 5 MG tablet Take 1 tablet (5 mg total) by mouth daily., Starting 06/21/2015, Until Discontinued, Normal    metoprolol tartrate (LOPRESSOR) 25 MG tablet Take 0.5 tablets (12.5 mg total) by mouth 2 (two) times daily., Starting 06/21/2015, Until Discontinued, Normal    nitroGLYCERIN (NITROSTAT) 0.4 MG SL tablet Place 1 tablet (0.4 mg total) under the tongue every 5 (five) minutes x 3 doses as needed for chest pain., Starting 06/21/2015,  Until Discontinued, Normal    prasugrel (EFFIENT) 10 MG TABS tablet Take 1 tablet (10 mg total) by mouth daily., Starting 06/21/2015, Until Discontinued, Normal      CONTINUE these medications which have NOT CHANGED   Details  aspirin 81 MG chewable tablet Chew 1 tablet (81 mg total) by mouth daily., Starting 01/03/2015, Until Discontinued, Normal    clonazePAM (KLONOPIN) 1 MG tablet Take 1 mg by mouth 3 (three) times daily. , Until Discontinued, Historical Med      STOP  taking these medications     mirtazapine (REMERON) 15 MG tablet      OXcarbazepine (TRILEPTAL) 150 MG tablet      oxyCODONE-acetaminophen (PERCOCET) 10-325 MG tablet      cetirizine (ZYRTEC) 10 MG tablet      dexlansoprazole (DEXILANT) 60 MG capsule      pregabalin (LYRICA) 75 MG capsule      tiZANidine (ZANAFLEX) 2 MG tablet               Today   CHIEF COMPLAINT:  No chest pain overnight No SI.   VITAL SIGNS:  Blood pressure 132/76, pulse 65, temperature 98.2 F (36.8 C), temperature source Oral, resp. rate 18, height 6\' 2"  (1.88 m), weight 89.132 kg (196 lb 8 oz), SpO2 96 %.   REVIEW OF SYSTEMS:  Review of Systems  Constitutional: Negative for fever, chills and malaise/fatigue.  HENT: Negative for ear discharge, ear pain, hearing loss, nosebleeds and sore throat.   Eyes: Negative for blurred vision and pain.  Respiratory: Negative for cough, hemoptysis, shortness of breath and wheezing.   Cardiovascular: Negative for chest pain, palpitations and leg swelling.  Gastrointestinal: Negative for nausea, vomiting, abdominal pain, diarrhea and blood in stool.  Genitourinary: Negative for dysuria.  Musculoskeletal: Negative for back pain.  Neurological: Negative for dizziness, tremors, speech change, focal weakness, seizures and headaches.  Endo/Heme/Allergies: Does not bruise/bleed easily.  Psychiatric/Behavioral: Positive for depression. Negative for suicidal ideas and hallucinations.     PHYSICAL EXAMINATION:  GENERAL:  45 y.o.-year-old patient lying in the bed with no acute distress.  NECK:  Supple, no jugular venous distention. No thyroid enlargement, no tenderness.  LUNGS: Normal breath sounds bilaterally, no wheezing, rales,rhonchi  No use of accessory muscles of respiration.  CARDIOVASCULAR: S1, S2 normal. No murmurs, rubs, or gallops.  ABDOMEN: Soft, non-tender, non-distended. Bowel sounds present. No organomegaly or mass.  EXTREMITIES: No pedal edema,  cyanosis, or clubbing.  PSYCHIATRIC: The patient is alert and oriented x 3.  SKIN: No obvious rash, lesion, or ulcer.   DATA REVIEW:   CBC  Recent Labs Lab 06/21/15 0702  WBC 8.4  HGB 14.2  HCT 41.8  PLT 198    Chemistries   Recent Labs Lab 06/20/15 1721  NA 134*  K 3.4*  CL 103  CO2 23  GLUCOSE 117*  BUN 12  CREATININE 1.04  CALCIUM 9.4    Cardiac Enzymes  Recent Labs Lab 06/20/15 1721 06/20/15 1920 06/21/15 0016  TROPONINI 0.09* 0.12* 0.10*    Microbiology Results  @[email or web address] @  RADIOLOGY:  Dg Chest 2 View  06/20/2015  CLINICAL DATA:  Chest pain, history of MI, heart racing EXAM: CHEST  2 VIEW COMPARISON:  09/09/2013 FINDINGS: Cardiomediastinal silhouette is stable. No acute infiltrate or pleural effusion. No pulmonary edema. Bony thorax is unremarkable. IMPRESSION: No active cardiopulmonary disease. Electronically Signed   By: Lahoma Crocker M.D.   On: 06/20/2015 17:48      Management plans discussed with  the patient and he is in agreement. Stable for discharge home  Patient should follow up with pcp in 1 week  CODE STATUS:     Code Status Orders        Start     Ordered   06/21/15 0013  Full code   Continuous     06/21/15 0012    Code Status History    Date Active Date Inactive Code Status Order ID Comments User Context   01/01/2015 11:20 PM 01/03/2015  4:15 PM Full Code [id number]  [name], MD Inpatient   01/01/2015 11:00 PM 01/01/2015 11:20 PM Full Code [id number]  Jay Schlichter, MD Inpatient      TOTAL TIME TAKING CARE OF THIS PATIENT: 35 minutes.    Note: This dictation was prepared with Dragon dictation along with smaller phrase technology. Any transcriptional errors that result from this process are unintentional.  Calianna Kim M.D on 06/21/2015 at 3:48 PM  Between 7am to 6pm - Pager - [phone] After 6pm go to [email or web address] - password [email or web address] [facility]  Office  [phone]  CC: Primary care  physician; [name], MD

## 2015-06-23 NOTE — Care Management (Addendum)
Received call from patient at 3:50pm.  Says that his family is at the [facility] and his prescriptions are not there.  Patient questions why his prescriptions were sent to [facility] when he informed CM that he did not have money to pay for his meds (lisinopril, metoprolol, atorvastatin, ntg, Effient)   CM receiving call does recall discussion  3/28 that attending should be contacted to have scripts reprinted and given to patient/ [facility].  Contacted attending and obtained permission to call in prescriptions to the clinic.  Clinic will contact Dr Clayborn Bigness to inquire whether Plavix can be supplemented for Effient as clinic does not have this medication.  Clinic will provide assist with completion of patient assistance application for Effient.  Informed patient that he must present to [facility] to obtain his medications 3/30 as clinic closes at 4:30.  Instructed to inquire prior to see if clinic has received response from Dr Clayborn Bigness prior to picking up meds to prevent 2 trips.  Instructed to let clinic assist him with completion of Effient application.  Patient had medication but it was terminated Jan 2017.  Patient has not intervened on his own behalf to reapply.  Provided phone number for financial counselors and instructed patient to call.  He says "they said maybe i could get adult medicaid."  Patient says he has two children in the home (he is the father) under the age of 12.  Complains that "it can take 45 days to get approval' for [facility]

## 2015-07-07 ENCOUNTER — Ambulatory Visit: Payer: Medicaid Other

## 2015-07-10 ENCOUNTER — Encounter: Payer: Self-pay | Admitting: Emergency Medicine

## 2015-07-10 ENCOUNTER — Emergency Department
Admission: EM | Admit: 2015-07-10 | Discharge: 2015-07-10 | Disposition: A | Discharge status: Home / Self Care | Payer: Medicaid Other | Attending: Emergency Medicine | Admitting: Emergency Medicine

## 2015-07-10 DIAGNOSIS — Z5321 Procedure and treatment not carried out due to patient leaving prior to being seen by health care provider: Secondary | ICD-10-CM | POA: Insufficient documentation

## 2015-07-10 DIAGNOSIS — R109 Unspecified abdominal pain: Secondary | ICD-10-CM | POA: Diagnosis present

## 2015-07-10 HISTORY — DX: Old myocardial infarction: I25.2

## 2015-07-10 LAB — URINALYSIS COMPLETE WITH MICROSCOPIC (ARMC ONLY)
BACTERIA UA: NONE SEEN
BILIRUBIN URINE: NEGATIVE
GLUCOSE, UA: NEGATIVE mg/dL
HGB URINE DIPSTICK: NEGATIVE
Ketones, ur: NEGATIVE mg/dL
LEUKOCYTES UA: NEGATIVE
Nitrite: NEGATIVE
Protein, ur: NEGATIVE mg/dL
RBC / HPF: NONE SEEN RBC/hpf (ref 0–5)
Specific Gravity, Urine: 1.016 (ref 1.005–1.030)
pH: 5 (ref 5.0–8.0)

## 2015-07-10 LAB — COMPREHENSIVE METABOLIC PANEL
ALBUMIN: 4.2 g/dL (ref 3.5–5.0)
ALT: 38 U/L (ref 17–63)
AST: 33 U/L (ref 15–41)
Alkaline Phosphatase: 94 U/L (ref 38–126)
Anion gap: 8 (ref 5–15)
BUN: 10 mg/dL (ref 6–20)
CHLORIDE: 106 mmol/L (ref 101–111)
CO2: 23 mmol/L (ref 22–32)
CREATININE: 0.88 mg/dL (ref 0.61–1.24)
Calcium: 9.2 mg/dL (ref 8.9–10.3)
GFR calc Af Amer: >60 mL/min (ref >60)
GLUCOSE: 149 mg/dL — AB (ref 65–99)
POTASSIUM: 3.9 mmol/L (ref 3.5–5.1)
SODIUM: 137 mmol/L (ref 135–145)
Total Bilirubin: 0.4 mg/dL (ref 0.3–1.2)
Total Protein: 7.1 g/dL (ref 6.5–8.1)

## 2015-07-10 LAB — LIPASE, BLOOD: LIPASE: 22 U/L (ref 11–51)

## 2015-07-10 LAB — CBC
HEMATOCRIT: 43.9 % (ref 40.0–52.0)
Hemoglobin: 14.9 g/dL (ref 13.0–18.0)
MCH: 30.8 pg (ref 26.0–34.0)
MCHC: 34 g/dL (ref 32.0–36.0)
MCV: 90.5 fL (ref 80.0–100.0)
PLATELETS: 173 10*3/uL (ref 150–440)
RBC: 4.85 MIL/uL (ref 4.40–5.90)
RDW: 14.1 % (ref 11.5–14.5)
WBC: 8.4 10*3/uL (ref 3.8–10.6)

## 2015-07-10 NOTE — ED Notes (Signed)
Mid abdominal pain began this am, nausea without vomiting. Denies fevers. States urinating more often but not painful.

## 2015-07-22 ENCOUNTER — Emergency Department: Payer: Medicaid Other

## 2015-07-22 ENCOUNTER — Emergency Department
Admission: EM | Admit: 2015-07-22 | Discharge: 2015-07-22 | Disposition: A | Discharge status: Home / Self Care | Payer: Medicaid Other | Attending: Emergency Medicine | Admitting: Emergency Medicine

## 2015-07-22 ENCOUNTER — Encounter: Payer: Self-pay | Admitting: Emergency Medicine

## 2015-07-22 DIAGNOSIS — I252 Old myocardial infarction: Secondary | ICD-10-CM | POA: Diagnosis not present

## 2015-07-22 DIAGNOSIS — F329 Major depressive disorder, single episode, unspecified: Secondary | ICD-10-CM | POA: Insufficient documentation

## 2015-07-22 DIAGNOSIS — F17210 Nicotine dependence, cigarettes, uncomplicated: Secondary | ICD-10-CM | POA: Diagnosis not present

## 2015-07-22 DIAGNOSIS — L03113 Cellulitis of right upper limb: Secondary | ICD-10-CM

## 2015-07-22 DIAGNOSIS — N189 Chronic kidney disease, unspecified: Secondary | ICD-10-CM | POA: Diagnosis not present

## 2015-07-22 DIAGNOSIS — F419 Anxiety disorder, unspecified: Secondary | ICD-10-CM

## 2015-07-22 DIAGNOSIS — I2510 Atherosclerotic heart disease of native coronary artery without angina pectoris: Secondary | ICD-10-CM | POA: Diagnosis not present

## 2015-07-22 DIAGNOSIS — M79601 Pain in right arm: Secondary | ICD-10-CM | POA: Diagnosis present

## 2015-07-22 DIAGNOSIS — I10 Essential (primary) hypertension: Secondary | ICD-10-CM

## 2015-07-22 DIAGNOSIS — M549 Dorsalgia, unspecified: Secondary | ICD-10-CM | POA: Insufficient documentation

## 2015-07-22 DIAGNOSIS — F32A Depression, unspecified: Secondary | ICD-10-CM

## 2015-07-22 DIAGNOSIS — I129 Hypertensive chronic kidney disease with stage 1 through stage 4 chronic kidney disease, or unspecified chronic kidney disease: Secondary | ICD-10-CM | POA: Insufficient documentation

## 2015-07-22 DIAGNOSIS — E119 Type 2 diabetes mellitus without complications: Secondary | ICD-10-CM | POA: Insufficient documentation

## 2015-07-22 DIAGNOSIS — M542 Cervicalgia: Secondary | ICD-10-CM | POA: Insufficient documentation

## 2015-07-22 LAB — URIC ACID: Uric Acid, Serum: 6.5 mg/dL (ref 4.4–7.6)

## 2015-07-22 LAB — CBC WITH DIFFERENTIAL/PLATELET
BASOS PCT: 1 %
Basophils Absolute: 0.1 10*3/uL (ref 0–0.1)
EOS PCT: 1 %
Eosinophils Absolute: 0.1 10*3/uL (ref 0–0.7)
HEMATOCRIT: 40.5 % (ref 40.0–52.0)
HEMOGLOBIN: 13.9 g/dL (ref 13.0–18.0)
LYMPHS PCT: 19 %
Lymphs Abs: 1.9 10*3/uL (ref 1.0–3.6)
MCH: 31 pg (ref 26.0–34.0)
MCHC: 34.4 g/dL (ref 32.0–36.0)
MCV: 90 fL (ref 80.0–100.0)
Monocytes Absolute: 0.6 10*3/uL (ref 0.2–1.0)
Monocytes Relative: 6 %
NEUTROS ABS: 7.2 10*3/uL — AB (ref 1.4–6.5)
NEUTROS PCT: 73 %
Platelets: 164 10*3/uL (ref 150–440)
RBC: 4.5 MIL/uL (ref 4.40–5.90)
RDW: 14 % (ref 11.5–14.5)
WBC: 9.9 10*3/uL (ref 3.8–10.6)

## 2015-07-22 MED ORDER — CLINDAMYCIN HCL 300 MG PO CAPS: 300.0000 mg | ORAL_CAPSULE | Freq: Three times a day (TID) | ORAL | Status: DC

## 2015-07-22 MED ORDER — NAPROXEN 500 MG PO TABS: 500.0000 mg | ORAL_TABLET | Freq: Two times a day (BID) | ORAL | Status: DC

## 2015-07-22 NOTE — Discharge Instructions (Signed)
Cellulitis Cellulitis is an infection of the skin and the tissue beneath it. The infected area is usually red and tender. Cellulitis occurs most often in the arms and lower legs.  CAUSES  Cellulitis is caused by bacteria that enter the skin through cracks or cuts in the skin. The most common types of bacteria that cause cellulitis are staphylococci and streptococci. SIGNS AND SYMPTOMS   Redness and warmth.  Swelling.  Tenderness or pain.  Fever. DIAGNOSIS  Your health care provider can usually determine what is wrong based on a physical exam. Blood tests may also be done. TREATMENT  Treatment usually involves taking an antibiotic medicine. HOME CARE INSTRUCTIONS   Take your antibiotic medicine as directed by your health care provider. Finish the antibiotic even if you start to feel better.  Keep the infected arm or leg elevated to reduce swelling.  Apply a warm cloth to the affected area up to 4 times per day to relieve pain.  Take medicines only as directed by your health care provider.  Keep all follow-up visits as directed by your health care provider. SEEK MEDICAL CARE IF:   You notice red streaks coming from the infected area.  Your red area gets larger or turns dark in color.  Your bone or joint underneath the infected area becomes painful after the skin has healed.  Your infection returns in the same area or another area.  You notice a swollen bump in the infected area.  You develop new symptoms.  You have a fever. SEEK IMMEDIATE MEDICAL CARE IF:   You feel very sleepy.  You develop vomiting or diarrhea.  You have a general ill feeling (malaise) with muscle aches and pains.   This information is not intended to replace advice given to you by your health care provider. Make sure you discuss any questions you have with your health care provider.   Document Released: 12/21/2004 Document Revised: 12/02/2014 Document Reviewed: 05/29/2011 Elsevier Interactive  Patient Education 2016 Else[employer] Inc[employer]  Panic Attacks Panic attacks are sudden, short feelings of great fear or discomfort. You may have them for no reason when you are relaxed, when you are uneasy (anxious), or when you are sleeping.  HOME CARE  Take all your medicines as told.  Check with your doctor before starting new medicines.  Keep all doctor visits. GET HELP IF:  You are not able to take your medicines as told.  Your symptoms do not get better.  Your symptoms get worse. GET HELP RIGHT AWAY IF:  Your attacks seem different than your normal attacks.  You have thoughts about hurting yourself or others.  You take panic attack medicine and you have a side effect. MAKE SURE YOU:  Understand these instructions.  Will watch your condition.  Will get help right away if you are not doing well or get worse.   This information is not intended to replace advice given to you by your health care provider. Make sure you discuss any questions you have with your health care provider.   Document Released: 04/15/2010 Document Revised: 01/01/2013 Document Reviewed: 10/25/2012 Elsevier Interactive Patient Education 2016 [employer].  Hypertension Hypertension is another name for high blood pressure. High blood pressure forces your heart to work harder to pump blood. A blood pressure reading has two numbers, which includes a higher number over a lower number (example: 110/72). HOME CARE   Have your blood pressure rechecked by your doctor.  Only take medicine as told by your doctor. Follow  the directions carefully. The medicine does not work as well if you skip doses. Skipping doses also puts you at risk for problems.  Do not smoke.  Monitor your blood pressure at home as told by your doctor. GET HELP IF:  You think you are having a reaction to the medicine you are taking.  You have repeat headaches or feel dizzy.  You have puffiness (swelling) in your ankles.  You have  trouble with your vision. GET HELP RIGHT AWAY IF:   You get a very bad headache and are confused.  You feel weak, numb, or faint.  You get chest or belly (abdominal) pain.  You throw up (vomit).  You cannot breathe very well. MAKE SURE YOU:   Understand these instructions.  Will watch your condition.  Will get help right away if you are not doing well or get worse.   This information is not intended to replace advice given to you by your health care provider. Make sure you discuss any questions you have with your health care provider.   Document Released: 08/30/2007 Document Revised: 03/18/2013 Document Reviewed: 01/03/2013 Elsevier Interactive Patient Education [employer].

## 2015-07-22 NOTE — ED Notes (Signed)
Patient presents to the ED with left arm redness, swelling and pain.  Patient reports increased anxiety x 2 weeks with increased blood pressure.  Patient states, "I wish they would give me something for my anxiety too."  Patient's right arm appears red from his mid forearm past his elbow.  Patient is complaining of elbow pain.

## 2015-07-22 NOTE — ED Provider Notes (Signed)
[facility] Emergency Department Provider Note  ____________________________________________  Time seen: Approximately 7:34 PM  I have reviewed the triage vital signs and the nursing notes.   HISTORY  Chief Complaint Arm Pain    HPI Michael Schmitt is a 45 y.o. male , NAD, presents to the emergency department with redness, swelling and pain to the right elbow. States this began approximately 2 days ago. Is currently on disability and does not work. Denies any injury, falls, traumas. Does not believe he was bitten by anything. Has not noted any oozing or weeping from the elbow. Has not noted any skin sores. Denies fever, chills, body aches. Has not had any abdominal pain, nausea, vomiting.   Also states he has had increased depression and anxiety since his MI in October 2016. Is currently taking Klonopin 1 mg 3 times daily but he doesn't feel that is working. States he is under a lot of stress at home. He denies suicidal or homicidal ideations at this time noting that he "lives for his 2 children". States he will be starting electroconvulsive shock treatment next Friday. Has taken those treatments in the past but unfortunately had a family emergency and which she missed 2 appointments and therefore had to restart. He notes he has been in psychotherapy in the past but doesn't feel that it helps. He believes that his disability status and sitting at home watching television without a life outside of his home is contributing to his depression.  Also notes that when he becomes stressed she can feel his blood pressure rising. He is on 2 blood pressure medications in which he takes on a daily basis. He does keep regular follow-up with his primary care provider who is monitoring.   Also reports he has chronic neck and back pain which he is no longer on pain medications. Lost his insurance sometime ago and was unable to return to the pain clinic. He was seen in this emergency  department approximately one month ago and the hospital is assisting him in getting established with the [place] [facility] pain clinic at this time.   Past Medical History  Diagnosis Date  . Hypertension   . Mental disorder   . Anxiety   . Shortness of breath   . RLS (restless legs syndrome)     detected on sleep study  . GERD (gastroesophageal reflux disease)   . Headache(784.0)   . Blood dyscrasia     told that when he was young he was a" free bleeder"  . Arthritis     cerv. stenosis, spondylosis, HNP- lower back , has been followed in pain clinic, has  had injection s in cerv. area  . Chronic kidney disease     renal calculi- passed spontaneously  . Anxiety   . Depression   . Diabetes mellitus without complication ([place])   . Cervicogenic headache 07/24/2014  . Cervical spondylosis without myelopathy 07/24/2014  . Fatty liver   . CAD (coronary artery disease)   . MI, old     Patient Active Problem List   Diagnosis Date Noted  . Major depressive disorder, recurrent episode, moderate with anxious distress ([place]) 06/21/2015  . NSTEMI (non-ST elevated myocardial infarction) ([place]) 06/20/2015  . STEMI (ST elevation myocardial infarction) ([place]) 01/01/2015  . Acute MI, inferoposterior wall ([place]) 01/01/2015  . Common bile duct dilation 10/06/2014  . Right lower quadrant abdominal pain 10/06/2014  . Loss of weight 10/06/2014  . Cervicogenic headache 07/24/2014  . Cervical spondylosis without  myelopathy 07/24/2014    Past Surgical History  Procedure Laterality Date  . Nasal sinus surgery      2005  . Anterior cervical decomp/discectomy fusion  11/13/2011    Procedure: ANTERIOR CERVICAL DECOMPRESSION/DISCECTOMY FUSION 1 LEVEL;  Surgeon: [name], MD;  Location: [place] NEUR[facility] ORS;  Service: Neurosurgery;  Laterality: N/A;  Anterior Cervical Decompression/discectomy Fusion. Cervical three-four.  . Cardiac catheterization N/A 01/01/2015    Procedure: Left Heart Cath and Coronary  Angiography;  Surgeon: [name], MD;  Location: [place] CV LAB;  Service: Cardiovascular;  Laterality: N/A;    Current Outpatient Rx  Name  Route  Sig  Dispense  Refill  . aspirin 81 MG chewable tablet   Oral   Chew 1 tablet (81 mg total) by mouth daily.   30 tablet   3   . atorvastatin (LIPITOR) 80 MG tablet   Oral   Take 1 tablet (80 mg total) by mouth daily at 6 PM.   30 tablet   3   . clindamycin (CLEOCIN) 300 MG capsule   Oral   Take 1 capsule (300 mg total) by mouth 3 (three) times daily.   30 capsule   0   . clonazePAM (KLONOPIN) 1 MG tablet   Oral   Take 1 mg by mouth 3 (three) times daily.          Marland Kitchen lisinopril (PRINIVIL,ZESTRIL) 5 MG tablet   Oral   Take 1 tablet (5 mg total) by mouth daily.   30 tablet   0   . metoprolol tartrate (LOPRESSOR) 25 MG tablet   Oral   Take 0.5 tablets (12.5 mg total) by mouth 2 (two) times daily.   30 tablet   3   . naproxen (NAPROSYN) 500 MG tablet   Oral   Take 1 tablet (500 mg total) by mouth 2 (two) times daily with a meal.   14 tablet   0   . nitroGLYCERIN (NITROSTAT) 0.4 MG SL tablet   Sublingual   Place 1 tablet (0.4 mg total) under the tongue every 5 (five) minutes x 3 doses as needed for chest pain.   25 tablet   12   . prasugrel (EFFIENT) 10 MG TABS tablet   Oral   Take 1 tablet (10 mg total) by mouth daily.   30 tablet   11     Allergies Prednisone; Hydrocodone; and Varenicline  Family History  Problem Relation Age of Onset  . Prostate cancer Father   . Hypertension Mother   . Kidney Stones Mother   . COPD Sister   . Hypertension Sister   . Diabetes Sister     Social History Social History  Substance Use Topics  . Smoking status: Current Every Day Smoker -- 1.50 packs/day for 17 years    Types: Cigarettes  . Smokeless tobacco: Never Used  . Alcohol Use: Yes     Comment: beer- 1-2 x's per month      Review of Systems Constitutional: No fever/chills, fatigue Eyes: No visual  changes.  Cardiovascular: No chest pain. Respiratory:  No shortness of breath. No wheezing.  Gastrointestinal: No abdominal pain.  No nausea, vomiting.  Musculoskeletal: Positive right elbow, upper arm pain. Positive chronic neck and back pain.   Psychological: Positive depression and anxiety. No suicidal or homicidal ideation.  Skin: Negative for rash. Neurological: Negative for headaches, focal weakness or numbness. No saddle paraesthesias. No loss of bowel or bladder.  10-point ROS otherwise negative.  ____________________________________________  PHYSICAL EXAM:  VITAL SIGNS: ED Triage Vitals  Enc Vitals Group     BP 07/22/15 1901 144/99 mmHg     Pulse Rate 07/22/15 1901 92     Resp 07/22/15 1901 20     Temp 07/22/15 1901 98.7 F (37.1 C)     Temp Source 07/22/15 1901 Oral     SpO2 07/22/15 1901 97 %     Weight 07/22/15 1901 203 lb (92.08 kg)     Height 07/22/15 1901 6\' 2"  (1.88 m)     Head Cir --      Peak Flow --      Pain Score 07/22/15 1902 8     Pain Loc --      Pain Edu? --      Excl. in [place]? --      Constitutional: Alert and oriented. Well appearing and in no acute distress. Eyes: Conjunctivae are normal. PERRL. EOMI without pain.  Head: Atraumatic. Neck: Supple with full range of motion Hematological/Lymphatic/Immunilogical: No cervical lymphadenopathy. Cardiovascular: Normal rate, regular rhythm. Normal S1 and S2.  Good peripheral circulation noting 2+ pulses in bilateral upper extremities. Capillary refill less than 3 seconds in the right upper extremity. Respiratory: Normal respiratory effort without tachypnea or retractions. Lungs CTAB with breath sounds noted in all lung fields. Musculoskeletal: Full range of motion of the right shoulder, right elbow, right wrist, right hand and fingers. Some pain with full extension at the right elbow. No effusion to palpation of the right elbow. Tender to palpation about the olecranon. No lower extremity tenderness nor  edema.  No joint effusions. Neurologic:  Normal speech and language. No gross focal neurologic deficits are appreciated.  Skin:  Skin about the right proximal forearm, elbow, distal upper arm is diffusely erythematous, warm to touch. No fluctuance or induration is noted. No open wounds or lesions. No skin sores. No oozing or weeping.  Psychiatric: Mood and affect are normal. Speech and behavior are normal. Patient exhibits appropriate insight and judgement. Patient continuously verbalizes he is "not suicidal".    ____________________________________________   LABS (all labs ordered are listed, but only abnormal results are displayed)  Labs Reviewed  CBC WITH DIFFERENTIAL/PLATELET  URIC ACID   ____________________________________________  EKG  None ____________________________________________  RADIOLOGY I have personally viewed and evaluated these images (plain radiographs) as part of my medical decision making, as well as reviewing the written report by the radiologist.  Dg Elbow Complete Right  07/22/2015  CLINICAL DATA:  Right arm redness, swelling and pain for 3 days. EXAM: RIGHT ELBOW - COMPLETE 3+ VIEW COMPARISON:  None. FINDINGS: There is no evidence of fracture, dislocation, or joint effusion. There is no evidence of arthropathy or other focal bone abnormality. Soft tissues are unremarkable. IMPRESSION: No acute fracture or dislocation. Electronically Signed   By: Abelardo Diesel M.D.   On: 07/22/2015 20:00    ____________________________________________    PROCEDURES  Procedure(s) performed: None    Medications - No data to display   ____________________________________________   INITIAL IMPRESSION / ASSESSMENT AND PLAN / ED COURSE  Pertinent labs & imaging results that were available during my care of the patient were reviewed by me and considered in my medical decision making (see chart for details).  Patient's diagnosis is consistent with cellulitis right  upper extremity, depression, anxiety, essential hypertension. Patient will be discharged home with prescriptions for clindamycin and naproxen to take as directed. Patient is to follow-up with his primary care provider in 2 days for  recheck.  If he is unable to get into his primary care provider he may go to the [place] [facility], [facility] or [facility] Crumble to [place] for follow-up. Patient is advised to continue regular follow-up with his primary care provider for continues treatment of his hypertension, depression and anxiety.  Patient is given strict ED precautions to return to the ED for any worsening or new symptoms.    ____________________________________________  FINAL CLINICAL IMPRESSION(S) / ED DIAGNOSES  Final diagnoses:  Cellulitis of right upper extremity  Depression  Anxiety  Essential hypertension      NEW MEDICATIONS STARTED DURING THIS VISIT:  New Prescriptions   CLINDAMYCIN (CLEOCIN) 300 MG CAPSULE    Take 1 capsule (300 mg total) by mouth 3 (three) times daily.   NAPROXEN (NAPROSYN) 500 MG TABLET    Take 1 tablet (500 mg total) by mouth 2 (two) times daily with a meal.         Braxton Feathers, PA-C 07/22/15 2122  Schuyler Amor, MD 07/22/15 [phone]

## 2015-07-30 ENCOUNTER — Ambulatory Visit (INDEPENDENT_AMBULATORY_CARE_PROVIDER_SITE_OTHER): Payer: Medicaid Other | Admitting: Psychiatry

## 2015-07-30 ENCOUNTER — Encounter: Payer: Self-pay | Admitting: Psychiatry

## 2015-07-30 VITALS — BP 120/82 | HR 86 | Temp 98.4°F | Ht 74.0 in | Wt 204.6 lb

## 2015-07-30 DIAGNOSIS — F339 Major depressive disorder, recurrent, unspecified: Secondary | ICD-10-CM

## 2015-07-31 ENCOUNTER — Emergency Department
Admission: EM | Admit: 2015-07-31 | Discharge: 2015-08-01 | Disposition: A | Discharge status: Home / Self Care | Payer: Medicaid Other | Attending: Emergency Medicine | Admitting: Emergency Medicine

## 2015-07-31 ENCOUNTER — Emergency Department: Payer: Medicaid Other

## 2015-07-31 DIAGNOSIS — F1019 Alcohol abuse with unspecified alcohol-induced disorder: Secondary | ICD-10-CM | POA: Insufficient documentation

## 2015-07-31 DIAGNOSIS — F418 Other specified anxiety disorders: Secondary | ICD-10-CM | POA: Diagnosis not present

## 2015-07-31 DIAGNOSIS — I129 Hypertensive chronic kidney disease with stage 1 through stage 4 chronic kidney disease, or unspecified chronic kidney disease: Secondary | ICD-10-CM | POA: Diagnosis not present

## 2015-07-31 DIAGNOSIS — F17210 Nicotine dependence, cigarettes, uncomplicated: Secondary | ICD-10-CM | POA: Insufficient documentation

## 2015-07-31 DIAGNOSIS — E1122 Type 2 diabetes mellitus with diabetic chronic kidney disease: Secondary | ICD-10-CM | POA: Insufficient documentation

## 2015-07-31 DIAGNOSIS — N189 Chronic kidney disease, unspecified: Secondary | ICD-10-CM | POA: Diagnosis not present

## 2015-07-31 DIAGNOSIS — Z79899 Other long term (current) drug therapy: Secondary | ICD-10-CM | POA: Diagnosis not present

## 2015-07-31 DIAGNOSIS — F1010 Alcohol abuse, uncomplicated: Secondary | ICD-10-CM

## 2015-07-31 DIAGNOSIS — F32A Depression, unspecified: Secondary | ICD-10-CM

## 2015-07-31 DIAGNOSIS — F329 Major depressive disorder, single episode, unspecified: Secondary | ICD-10-CM

## 2015-07-31 DIAGNOSIS — R079 Chest pain, unspecified: Secondary | ICD-10-CM | POA: Diagnosis present

## 2015-07-31 DIAGNOSIS — Z7982 Long term (current) use of aspirin: Secondary | ICD-10-CM | POA: Insufficient documentation

## 2015-07-31 DIAGNOSIS — I2510 Atherosclerotic heart disease of native coronary artery without angina pectoris: Secondary | ICD-10-CM | POA: Insufficient documentation

## 2015-07-31 LAB — BASIC METABOLIC PANEL
ANION GAP: 12 (ref 5–15)
BUN: 12 mg/dL (ref 6–20)
CO2: 23 mmol/L (ref 22–32)
Calcium: 9.7 mg/dL (ref 8.9–10.3)
Chloride: 105 mmol/L (ref 101–111)
Creatinine, Ser: 0.94 mg/dL (ref 0.61–1.24)
GFR calc Af Amer: >60 mL/min (ref >60)
GLUCOSE: 101 mg/dL — AB (ref 65–99)
POTASSIUM: 3.7 mmol/L (ref 3.5–5.1)
Sodium: 140 mmol/L (ref 135–145)

## 2015-07-31 LAB — CBC
HEMATOCRIT: 43.9 % (ref 40.0–52.0)
HEMOGLOBIN: 15.3 g/dL (ref 13.0–18.0)
MCH: 31 pg (ref 26.0–34.0)
MCHC: 34.7 g/dL (ref 32.0–36.0)
MCV: 89.3 fL (ref 80.0–100.0)
Platelets: 236 10*3/uL (ref 150–440)
RBC: 4.92 MIL/uL (ref 4.40–5.90)
RDW: 13.9 % (ref 11.5–14.5)
WBC: 9.7 10*3/uL (ref 3.8–10.6)

## 2015-07-31 LAB — URINE DRUG SCREEN, QUALITATIVE (ARMC ONLY)
AMPHETAMINES, UR SCREEN: NOT DETECTED
Barbiturates, Ur Screen: NOT DETECTED
Benzodiazepine, Ur Scrn: POSITIVE — AB
Cannabinoid 50 Ng, Ur ~~LOC~~: NOT DETECTED
Cocaine Metabolite,Ur ~~LOC~~: NOT DETECTED
MDMA (ECSTASY) UR SCREEN: NOT DETECTED
METHADONE SCREEN, URINE: NOT DETECTED
Opiate, Ur Screen: NOT DETECTED
PHENCYCLIDINE (PCP) UR S: NOT DETECTED
TRICYCLIC, UR SCREEN: POSITIVE — AB

## 2015-07-31 LAB — TROPONIN I: Troponin I: <0.03 ng/mL (ref <0.031)

## 2015-07-31 LAB — ETHANOL

## 2015-07-31 MED ORDER — LORATADINE 10 MG PO TABS
10.0000 mg | ORAL_TABLET | Freq: Every day | ORAL | Status: DC
  Administered 2015-08-01: 10 mg via ORAL
  Filled 2015-07-31: qty 1

## 2015-07-31 MED ORDER — NITROGLYCERIN 0.4 MG SL SUBL
0.4000 mg | SUBLINGUAL_TABLET | SUBLINGUAL | Status: DC | PRN
  Filled 2015-07-31: qty 25

## 2015-07-31 MED ORDER — CLOPIDOGREL BISULFATE 75 MG PO TABS
75.0000 mg | ORAL_TABLET | Freq: Once | ORAL | Status: AC
  Administered 2015-08-01: 75 mg via ORAL
  Filled 2015-07-31: qty 1

## 2015-07-31 MED ORDER — GLIMEPIRIDE 1 MG PO TABS
1.0000 mg | ORAL_TABLET | Freq: Every day | ORAL | Status: DC
  Administered 2015-08-01: 1 mg via ORAL
  Filled 2015-07-31 (×2): qty 1

## 2015-07-31 MED ORDER — ATORVASTATIN CALCIUM 20 MG PO TABS: 80.0000 mg | ORAL_TABLET | Freq: Every day | ORAL | Status: DC

## 2015-07-31 MED ORDER — MIRTAZAPINE 15 MG PO TABS
15.0000 mg | ORAL_TABLET | Freq: Every day | ORAL | Status: DC
  Administered 2015-07-31: 15 mg via ORAL
  Filled 2015-07-31: qty 1

## 2015-07-31 MED ORDER — LISINOPRIL 5 MG PO TABS
5.0000 mg | ORAL_TABLET | Freq: Every day | ORAL | Status: DC
  Administered 2015-08-01: 5 mg via ORAL
  Filled 2015-07-31: qty 1

## 2015-07-31 MED ORDER — NICOTINE 21 MG/24HR TD PT24
21.0000 mg | MEDICATED_PATCH | Freq: Once | TRANSDERMAL | Status: DC
  Administered 2015-07-31: 21 mg via TRANSDERMAL
  Filled 2015-07-31: qty 1

## 2015-07-31 MED ORDER — CLONAZEPAM 1 MG PO TABS
1.0000 mg | ORAL_TABLET | Freq: Three times a day (TID) | ORAL | Status: DC
  Administered 2015-07-31 – 2015-08-01 (×3): 1 mg via ORAL
  Filled 2015-07-31: qty 1
  Filled 2015-07-31: qty 2
  Filled 2015-07-31: qty 1

## 2015-07-31 MED ORDER — ASPIRIN 81 MG PO CHEW
81.0000 mg | CHEWABLE_TABLET | Freq: Every day | ORAL | Status: DC
  Administered 2015-08-01: 81 mg via ORAL
  Filled 2015-07-31: qty 1

## 2015-07-31 MED ORDER — METOPROLOL TARTRATE 25 MG PO TABS
12.5000 mg | ORAL_TABLET | Freq: Two times a day (BID) | ORAL | Status: DC
  Administered 2015-07-31 – 2015-08-01 (×2): 12.5 mg via ORAL
  Filled 2015-07-31 (×2): qty 1

## 2015-07-31 MED ORDER — ACETAMINOPHEN 500 MG PO TABS
1000.0000 mg | ORAL_TABLET | Freq: Once | ORAL | Status: AC
  Administered 2015-07-31: 1000 mg via ORAL
  Filled 2015-07-31: qty 2

## 2015-07-31 NOTE — ED Notes (Signed)
Patient returned from XR. 

## 2015-07-31 NOTE — ED Notes (Signed)

## 2015-07-31 NOTE — BH Assessment (Signed)
Assessment Note  Michael Schmitt is an 45 y.o. male presenting to the ED with concerns of depression, anxiety and chest pains. Patient reports being depressed for years, following a back injury he sustained several years ago.  Despite medications and talk therapy, he says his depression has not improved.  He reports seeing [name] yesterday for referral for ECT but was denied because he doesn't have insurance. He reports loosing his Medicaid because he makes too much with disability.  He says that it will be August before he is approved for Medicare.   Patient reports drinking last night and made the "bad decision" to drive.  He reports getting arrested for DUI.  He reports having anxieties about the thought of having to go to court next month.  He says that he decided to drink because he is in constant pain and is "tired of being depressed all the time"  He states he came to the ED because he wants help with the chronic pain and treatment for depression.  Pt denies any SI/HI or auditory/visual hallucinations.  He denies drug/alcohol abuse.   Diagnosis: Depression  Past Medical History:  Past Medical History  Diagnosis Date  . Hypertension   . Mental disorder   . Anxiety   . Shortness of breath   . RLS (restless legs syndrome)     detected on sleep study  . GERD (gastroesophageal reflux disease)   . Headache(784.0)   . Blood dyscrasia     told that when he was young he was a" free bleeder"  . Arthritis     cerv. stenosis, spondylosis, HNP- lower back , has been followed in pain clinic, has  had injection s in cerv. area  . Chronic kidney disease     renal calculi- passed spontaneously  . Anxiety   . Depression   . Diabetes mellitus without complication ([place])   . Cervicogenic headache 07/24/2014  . Cervical spondylosis without myelopathy 07/24/2014  . Fatty liver   . CAD (coronary artery disease)   . MI, old     Past Surgical History  Procedure Laterality Date  . Nasal sinus  surgery      2005  . Anterior cervical decomp/discectomy fusion  11/13/2011    Procedure: ANTERIOR CERVICAL DECOMPRESSION/DISCECTOMY FUSION 1 LEVEL;  Surgeon: [name], MD;  Location: [place] NEURO ORS;  Service: Neurosurgery;  Laterality: N/A;  Anterior Cervical Decompression/discectomy Fusion. Cervical three-four.  . Cardiac catheterization N/A 01/01/2015    Procedure: Left Heart Cath and Coronary Angiography;  Surgeon: [name], MD;  Location: [place] CV LAB;  Service: Cardiovascular;  Laterality: N/A;    Family History:  Family History  Problem Relation Age of Onset  . Prostate cancer Father   . Depression Father   . Hypertension Mother   . Kidney Stones Mother   . Anxiety disorder Mother   . Depression Mother   . COPD Sister   . Hypertension Sister   . Diabetes Sister   . Depression Sister   . Anxiety disorder Sister   . Seizures Sister     Social History:  reports that he has been smoking Cigarettes.  He has a 25.5 pack-year smoking history. He has never used smokeless tobacco. He reports that he drinks about 7.2 oz of alcohol per week. He reports that he does not use illicit drugs.  Additional Social History:  Alcohol / Drug Use History of alcohol / drug use?: No history of alcohol / drug abuse (Pt denies)  CIWA: CIWA-Ar BP: (!) 154/89 mmHg Pulse Rate: 95 COWS:    Allergies:  Allergies  Allergen Reactions  . Prednisone Other (See Comments)    Hypertension, makes him feel spacey  . Hydrocodone Other (See Comments)    Headache, irritable  . Varenicline Other (See Comments)    Suicidal thoughts    Home Medications:  (Not in a hospital admission)  OB/GYN Status:  No LMP for male patient.  General Assessment Data Location of Assessment: [facility] ED TTS Assessment: In system Is this a Tele or Face-to-Face Assessment?: Face-to-Face Is this an Initial Assessment or a Re-assessment for this encounter?: Initial Assessment Marital status: Single Maiden name:  N/A Is patient pregnant?: No Pregnancy Status: No Living Arrangements: Children Can pt return to current living arrangement?: Yes Admission Status: Voluntary Is patient capable of signing voluntary admission?: Yes Referral Source: Self/Family/Friend Insurance type: None     Crisis Care Plan Living Arrangements: Children Legal Guardian: Other: (self) Name of Psychiatrist: [name] Name of Therapist: [name]  Education Status Is patient currently in school?: No Current Grade: N/A Highest grade of school patient has completed: 12th Name of school: N/A Contact person: N/A  Risk to self with the past 6 months Suicidal Ideation: No Has patient been a risk to self within the past 6 months prior to admission? : No Suicidal Intent: No Has patient had any suicidal intent within the past 6 months prior to admission? : No Is patient at risk for suicide?: No Suicidal Plan?: No Has patient had any suicidal plan within the past 6 months prior to admission? : No Access to Means: No What has been your use of drugs/alcohol within the last 12 months?: a couple of beers Previous Attempts/Gestures: No How many times?: 0 Other Self Harm Risks: None identified Triggers for Past Attempts: None known Intentional Self Injurious Behavior: None Family Suicide History: No Recent stressful life event(s): Recent negative physical changes, Legal Issues Persecutory voices/beliefs?: No Depression: Yes Depression Symptoms: Loss of interest in usual pleasures, Feeling worthless/self pity, Despondent Substance abuse history and/or treatment for substance abuse?: No Suicide prevention information given to non-admitted patients: Not applicable  Risk to Others within the past 6 months Homicidal Ideation: No Does patient have any lifetime risk of violence toward others beyond the six months prior to admission? : No Thoughts of Harm to Others: No Current Homicidal  Intent: No Current Homicidal Plan: No Access to Homicidal Means: No Identified Victim: None identified History of harm to others?: No Assessment of Violence: None Noted Violent Behavior Description: None identified Does patient have access to weapons?: No Criminal Charges Pending?: Yes Describe Pending Criminal Charges: DWI Does patient have a court date: Yes Court Date: 08/31/15 Is patient on probation?: No  Psychosis Hallucinations: None noted Delusions: None noted  Mental Status Report Appearance/Hygiene: In scrubs Eye Contact: Good Motor Activity: Freedom of movement Speech: Logical/coherent Level of Consciousness: Alert Mood: Depressed Affect: Appropriate to circumstance, Depressed Anxiety Level: None Thought Processes: Coherent, Relevant Judgement: Unimpaired Orientation: Person, Place, Time, Situation Obsessive Compulsive Thoughts/Behaviors: None  Cognitive Functioning Concentration: Normal Memory: Recent Intact, Remote Intact IQ: Average Insight: Good Impulse Control: Good Appetite: Good Weight Loss: 0 Weight Gain: 0 Sleep: No Change Total Hours of Sleep: 6 Vegetative Symptoms: None  ADLScreening [facility] Assessment Services) Patient's cognitive ability adequate to safely complete daily activities?: Yes Patient able to express need for assistance with ADLs?: Yes Independently performs ADLs?: Yes (appropriate for developmental age)  Prior Inpatient Therapy  Prior Inpatient Therapy: No Prior Therapy Dates: N/A Prior Therapy Facilty/Provider(s): N/A Reason for Treatment: N/A  Prior Outpatient Therapy Prior Outpatient Therapy: Yes Prior Therapy Dates: current Prior Therapy Facilty[facility]Provider([facility]): [facility] Reason for Treatment: depression Does patient have an ACCT team?: No Does patient have Intensive In-House Services?  : No Does patient have [facility] services? : No Does patient have P4CC services?: No  ADL Screening (condition at time of  admission) Patient's cognitive ability adequate to safely complete daily activities?: Yes Patient able to express need for assistance with ADLs?: Yes Independently performs ADLs?: Yes (appropriate for developmental age)       Abuse/Neglect Assessment (Assessment to be complete while patient is alone) Physical Abuse: Denies Verbal Abuse: Denies Sexual Abuse: Denies Exploitation of patient/patient's resources: Denies Self-Neglect: Denies Values / Beliefs Cultural Requests During Hospitalization: None Spiritual Requests During Hospitalization: None Consults Spiritual Care Consult Needed: No Social Work Consult Needed: No Regulatory affairs officer (For Healthcare) Does patient have an advance directive?: No Would patient like information on creating an advanced directive?: No - patient declined information    Additional Information 1:1 In Past 12 Months?: No CIRT Risk: No Elopement Risk: No Does patient have medical clearance?: Yes     Disposition:  Disposition Initial Assessment Completed for this Encounter: Yes Disposition of Patient: Other dispositions Other disposition(s): Other (Comment) (Psych MD Consult)  On Site Evaluation by:   Reviewed with Physician:    Oneita Hurt 07/31/2015 10:36 PM

## 2015-07-31 NOTE — ED Notes (Signed)
Pt sending his sister home with wallet, some cash money, coins, klonopin and Nitroglycerin

## 2015-07-31 NOTE — ED Notes (Signed)
Patient transported to X-ray 

## 2015-07-31 NOTE — ED Notes (Signed)
Took patient a sandwich tray and Diet Coke, removed utensils.

## 2015-07-31 NOTE — ED Provider Notes (Signed)
[facility] Emergency Department Provider Note        Time seen: ----------------------------------------- 8:19 PM on 07/31/2015 -----------------------------------------    I have reviewed the triage vital signs and the nursing notes.   HISTORY  Chief Complaint Chest Pain; Panic Attack; and Depression    HPI Michael Schmitt is a 45 y.o. male who presents to ER for chest pain and anxiety. Patient states his been depressed for years, has not improved despite treatment. He states he was post to receive ECT but his insurance wouldn't cover it. Patient states last night he drank and got arrested for a DUI. Patient states this is what made him very anxious and depressed feeling. He states he is in chronic constant pain.   Past Medical History  Diagnosis Date  . Hypertension   . Mental disorder   . Anxiety   . Shortness of breath   . RLS (restless legs syndrome)     detected on sleep study  . GERD (gastroesophageal reflux disease)   . Headache(784.0)   . Blood dyscrasia     told that when he was young he was a" free bleeder"  . Arthritis     cerv. stenosis, spondylosis, HNP- lower back , has been followed in pain clinic, has  had injection s in cerv. area  . Chronic kidney disease     renal calculi- passed spontaneously  . Anxiety   . Depression   . Diabetes mellitus without complication ([place])   . Cervicogenic headache 07/24/2014  . Cervical spondylosis without myelopathy 07/24/2014  . Fatty liver   . CAD (coronary artery disease)   . MI, old     Patient Active Problem List   Diagnosis Date Noted  . Major depressive disorder, recurrent episode, moderate with anxious distress ([place]) 06/21/2015  . NSTEMI (non-ST elevated myocardial infarction) ([place]) 06/20/2015  . STEMI (ST elevation myocardial infarction) ([place]) 01/01/2015  . Acute MI, inferoposterior wall ([place]) 01/01/2015  . Common bile duct dilation 10/06/2014  . Right lower quadrant abdominal  pain 10/06/2014  . Loss of weight 10/06/2014  . Cervicogenic headache 07/24/2014  . Cervical spondylosis without myelopathy 07/24/2014  . Clinical depression 04/15/2013  . Acid reflux 04/15/2013  . BP (high blood pressure) 04/15/2013  . Encounter for other preprocedural examination 04/15/2013  . Nasal obstruction 02/12/2013  . Deflected nasal septum 02/12/2013  . Inflamed nasal mucosa 02/12/2013  . Sinus infection 02/12/2013  . Gastric catarrh 03/01/2011  . Allergic rhinitis 10/20/2010  . Airway hyperreactivity 10/20/2010  . Anxiety, generalized 10/20/2010  . Cervical pain 10/20/2010  . Current tobacco use 10/20/2010    Past Surgical History  Procedure Laterality Date  . Nasal sinus surgery      2005  . Anterior cervical decomp/discectomy fusion  11/13/2011    Procedure: ANTERIOR CERVICAL DECOMPRESSION/DISCECTOMY FUSION 1 LEVEL;  Surgeon: [name], MD;  Location: [facility];  Service: Neurosurgery;  Laterality: N/A;  Anterior Cervical Decompression/discectomy Fusion. Cervical three-four.  . Cardiac catheterization N/A 01/01/2015    Procedure: Left Heart Cath and Coronary Angiography;  Surgeon: [name], MD;  Location: [facility];  Service: Cardiovascular;  Laterality: N/A;    Allergies Prednisone; Hydrocodone; and Varenicline  Social History Social History  Substance Use Topics  . Smoking status: Current Every Day Smoker -- 1.50 packs/day for 17 years    Types: Cigarettes  . Smokeless tobacco: Never Used  . Alcohol Use: 7.2 oz/week    0 Glasses of wine, 12  Cans of beer, 0 Shots of liquor per week     Comment: beer- 1-2 x's per month     Review of Systems Constitutional: Negative for fever. Eyes: Negative for visual changes. ENT: Negative for sore throat. Cardiovascular: Negative for chest pain. Respiratory: Negative for shortness of breath. Gastrointestinal: Negative for abdominal pain, vomiting and diarrhea. Genitourinary: Negative for  dysuria. Musculoskeletal: Positive for chronic back pain Skin: Negative for rash. Neurological: Negative for headaches, focal weakness or numbness. Psychiatric: Positive for depression, denies SI 10-point ROS otherwise negative.  ____________________________________________   PHYSICAL EXAM:  VITAL SIGNS: ED Triage Vitals  Enc Vitals Group     BP 07/31/15 2003 154/89 mmHg     Pulse Rate 07/31/15 2003 95     Resp 07/31/15 2003 18     Temp 07/31/15 2003 98.2 F (36.8 C)     Temp Source 07/31/15 2003 Oral     SpO2 07/31/15 2003 97 %     Weight 07/31/15 2003 204 lb (92.534 kg)     Height 07/31/15 2003 6\' 2"  (1.88 m)     Head Cir --      Peak Flow --      Pain Score 07/31/15 2004 7     Pain Loc --      Pain Edu? --      Excl. in [place]? --     Constitutional: Alert and oriented. Well appearing and in no distress. Eyes: Conjunctivae are normal. PERRL. Normal extraocular movements. ENT   Head: Normocephalic and atraumatic.   Nose: No congestion/rhinnorhea.   Mouth/Throat: Mucous membranes are moist.   Neck: No stridor. Cardiovascular: Normal rate, regular rhythm. No murmurs, rubs, or gallops. Respiratory: Normal respiratory effort without tachypnea nor retractions. Breath sounds are clear and equal bilaterally. No wheezes/rales/rhonchi. Gastrointestinal: Soft and nontender. Normal bowel sounds Musculoskeletal: Nontender with normal range of motion in all extremities. No lower extremity tenderness nor edema. Neurologic:  Normal speech and language. No gross focal neurologic deficits are appreciated.  Skin:  Skin is warm, dry and intact. No rash noted. Psychiatric: Depressed mood and affect ____________________________________________  EKG: Interpreted by me. Sinus rhythm with a rate of 96 bpm, normal PR interval, normal QRS, normal QT interval. Normal axis. Nonspecific t weight changes ____________________________________________  ED COURSE:  Pertinent labs &  imaging results that were available during my care of the patient were reviewed by me and considered in my medical decision making (see chart for details). Patient presents with depression, I will check basic labs and likely consult psychiatry. ____________________________________________    LABS (pertinent positives/negatives)  Labs Reviewed  BASIC METABOLIC PANEL - Abnormal; Notable for the following:    Glucose, Bld 101 (*)    All other components within normal limits  URINE DRUG SCREEN, QUALITATIVE ([facility] ONLY) - Abnormal; Notable for the following:    Tricyclic, Ur Screen POSITIVE (*)    Benzodiazepine, Ur Scrn POSITIVE (*)    All other components within normal limits  CBC  TROPONIN I  ETHANOL    RADIOLOGY  Chest x-ray IMPRESSION: No active cardiopulmonary disease. ____________________________________________  FINAL ASSESSMENT AND PLAN  Depression, anxiety, alcohol abuse  Plan: Patient with labs and imaging as dictated above. Patient is medically stable for psychiatric disposition.   Earleen Newport, MD   Note: This dictation was prepared with Dragon dictation. Any transcriptional errors that result from this process are unintentional   Earleen Newport, MD 07/31/15 2245

## 2015-07-31 NOTE — ED Notes (Signed)
Pt says he's not sure if he's having chest pain or a panic attack; pt adds he's been depressed for years; denies suicidal thoughts at this time; pt says last night he "did something stupid"-drank beer and got arrested for DUI; says this has set him off today; pt with flat affect; pt adds he is having pain to right lower back, history of bulging disc;

## 2015-08-01 MED ORDER — IBUPROFEN 800 MG PO TABS: 800.0000 mg | ORAL_TABLET | Freq: Once | ORAL | Status: DC

## 2015-08-01 MED ORDER — NAPROXEN 500 MG PO TABS
ORAL_TABLET | ORAL | Status: AC
  Administered 2015-08-01: 500 mg via ORAL
  Filled 2015-08-01: qty 1

## 2015-08-01 MED ORDER — ASPIRIN 81 MG PO CHEW
CHEWABLE_TABLET | ORAL | Status: AC
  Administered 2015-08-01: 81 mg via ORAL
  Filled 2015-08-01: qty 1

## 2015-08-01 MED ORDER — NAPROXEN 250 MG PO TABS
500.0000 mg | ORAL_TABLET | Freq: Once | ORAL | Status: AC
  Administered 2015-08-01: 500 mg via ORAL
  Filled 2015-08-01: qty 2

## 2015-08-01 NOTE — ED Notes (Signed)

## 2015-08-01 NOTE — Progress Notes (Signed)
Patient assigned to appropriate care area. Patient oriented to unit/care area: Informed that, for their safety, care areas are designed for safety and monitored by security cameras at all times; and visiting hours explained to patient. Patient verbalizes understanding, and verbal contract for safety obtained.Patient denies needs/SI/HI.WIll continue to monitor for safety.

## 2015-08-01 NOTE — ED Notes (Signed)
Patient resting quietly in room. No noted distress or abnormal behaviors noted. Will continue 15 minute checks and observation by security camera for safety. 

## 2015-08-01 NOTE — ED Notes (Signed)

## 2015-08-01 NOTE — ED Notes (Signed)
Patient met with psychiatrist.  Maintained on 15 minute checks and observation by security camera for safety. 

## 2015-08-01 NOTE — ED Notes (Signed)
Patient discharged ambulatory to home. He denies SI or HI. Discharge instructions reviewed with patient, he verbalizes understanding. Patient received copy of DC plan and all personal belongings.

## 2015-08-01 NOTE — Discharge Instructions (Signed)
Please return to the emergency department for thoughts of hurting yourself or anyone else, hallucinations, or any other symptoms concerning to you.

## 2015-08-01 NOTE — Progress Notes (Signed)
Report received from David, RN

## 2015-08-01 NOTE — ED Notes (Signed)
Patient awake, alert, and oriented. He states he is depressed and anxious and medications are not working. He denies SI. No evidence of psychotic thinking. Patient is focused on legal issues, and also inability to get health insurance until August. He states this is the reason he came to the hospital. He says he can no longer afford his medications and is unable to see out- patient physician. He wants to be hospitalized. Maintained on 15 minute checks and observation by security camera for safety.

## 2015-08-01 NOTE — ED Notes (Signed)
Patient belongings taken to [facility]:  One pairs of pants, One pair of socks, One t-shirt, One pair of underwear, One pair of shoes.  All valuables given to family prior to rooming in ER per Triage RN

## 2015-08-01 NOTE — ED Provider Notes (Signed)
-----------------------------------------   7:23 AM on 08/01/2015 -----------------------------------------   Blood pressure 136/93, pulse 79, temperature 97.6 F (36.4 C), temperature source Oral, resp. rate 18, height 6\' 2"  (1.88 m), weight 204 lb (92.534 kg), SpO2 97 %.  The patient had no acute events since last update.  Calm and cooperative at this time.    The patient is awaiting psych evaluation.     Loney Hering, MD 08/01/15 [phone]

## 2015-08-05 ENCOUNTER — Emergency Department
Admission: EM | Admit: 2015-08-05 | Discharge: 2015-08-05 | Disposition: A | Discharge status: Home / Self Care | Payer: Medicaid Other | Attending: Emergency Medicine | Admitting: Emergency Medicine

## 2015-08-05 ENCOUNTER — Encounter: Payer: Self-pay | Admitting: Emergency Medicine

## 2015-08-05 DIAGNOSIS — M1990 Unspecified osteoarthritis, unspecified site: Secondary | ICD-10-CM | POA: Insufficient documentation

## 2015-08-05 DIAGNOSIS — N189 Chronic kidney disease, unspecified: Secondary | ICD-10-CM | POA: Insufficient documentation

## 2015-08-05 DIAGNOSIS — F17210 Nicotine dependence, cigarettes, uncomplicated: Secondary | ICD-10-CM | POA: Insufficient documentation

## 2015-08-05 DIAGNOSIS — Z79899 Other long term (current) drug therapy: Secondary | ICD-10-CM | POA: Insufficient documentation

## 2015-08-05 DIAGNOSIS — E119 Type 2 diabetes mellitus without complications: Secondary | ICD-10-CM | POA: Diagnosis not present

## 2015-08-05 DIAGNOSIS — I2510 Atherosclerotic heart disease of native coronary artery without angina pectoris: Secondary | ICD-10-CM | POA: Diagnosis not present

## 2015-08-05 DIAGNOSIS — F331 Major depressive disorder, recurrent, moderate: Secondary | ICD-10-CM | POA: Diagnosis not present

## 2015-08-05 DIAGNOSIS — I129 Hypertensive chronic kidney disease with stage 1 through stage 4 chronic kidney disease, or unspecified chronic kidney disease: Secondary | ICD-10-CM | POA: Diagnosis not present

## 2015-08-05 DIAGNOSIS — R197 Diarrhea, unspecified: Secondary | ICD-10-CM | POA: Insufficient documentation

## 2015-08-05 DIAGNOSIS — R1084 Generalized abdominal pain: Secondary | ICD-10-CM

## 2015-08-05 DIAGNOSIS — I252 Old myocardial infarction: Secondary | ICD-10-CM | POA: Insufficient documentation

## 2015-08-05 DIAGNOSIS — Z7984 Long term (current) use of oral hypoglycemic drugs: Secondary | ICD-10-CM | POA: Insufficient documentation

## 2015-08-05 DIAGNOSIS — Z7982 Long term (current) use of aspirin: Secondary | ICD-10-CM | POA: Insufficient documentation

## 2015-08-05 LAB — URINALYSIS COMPLETE WITH MICROSCOPIC (ARMC ONLY)
BILIRUBIN URINE: NEGATIVE
Bacteria, UA: NONE SEEN
GLUCOSE, UA: NEGATIVE mg/dL
HGB URINE DIPSTICK: NEGATIVE
KETONES UR: NEGATIVE mg/dL
LEUKOCYTES UA: NEGATIVE
NITRITE: NEGATIVE
Protein, ur: NEGATIVE mg/dL
Specific Gravity, Urine: 1.018 (ref 1.005–1.030)
pH: 6 (ref 5.0–8.0)

## 2015-08-05 LAB — CBC
HCT: 44.1 % (ref 40.0–52.0)
Hemoglobin: 15 g/dL (ref 13.0–18.0)
MCH: 30.5 pg (ref 26.0–34.0)
MCHC: 34.1 g/dL (ref 32.0–36.0)
MCV: 89.5 fL (ref 80.0–100.0)
PLATELETS: 233 10*3/uL (ref 150–440)
RBC: 4.93 MIL/uL (ref 4.40–5.90)
RDW: 13.9 % (ref 11.5–14.5)
WBC: 9.5 10*3/uL (ref 3.8–10.6)

## 2015-08-05 LAB — COMPREHENSIVE METABOLIC PANEL
ALT: 27 U/L (ref 17–63)
AST: 22 U/L (ref 15–41)
Albumin: 4.5 g/dL (ref 3.5–5.0)
Alkaline Phosphatase: 105 U/L (ref 38–126)
Anion gap: 7 (ref 5–15)
BILIRUBIN TOTAL: 0.4 mg/dL (ref 0.3–1.2)
BUN: 11 mg/dL (ref 6–20)
CALCIUM: 9.6 mg/dL (ref 8.9–10.3)
CO2: 29 mmol/L (ref 22–32)
CREATININE: 0.94 mg/dL (ref 0.61–1.24)
Chloride: 104 mmol/L (ref 101–111)
Glucose, Bld: 84 mg/dL (ref 65–99)
Potassium: 4 mmol/L (ref 3.5–5.1)
Sodium: 140 mmol/L (ref 135–145)
TOTAL PROTEIN: 7.5 g/dL (ref 6.5–8.1)

## 2015-08-05 LAB — LIPASE, BLOOD: Lipase: 37 U/L (ref 11–51)

## 2015-08-05 MED ORDER — RANITIDINE HCL 150 MG PO TABS
150.0000 mg | ORAL_TABLET | Freq: Every day | ORAL | Status: DC
Start: 2015-08-05 — End: 2015-09-23

## 2015-08-05 MED ORDER — GI COCKTAIL ~~LOC~~
30.0000 mL | Freq: Once | ORAL | Status: AC
  Administered 2015-08-05: 30 mL via ORAL

## 2015-08-05 MED ORDER — DICYCLOMINE HCL 20 MG PO TABS: 20.0000 mg | ORAL_TABLET | Freq: Three times a day (TID) | ORAL | Status: DC | PRN

## 2015-08-05 MED ORDER — DICYCLOMINE HCL 10 MG/ML IM SOLN
20.0000 mg | Freq: Once | INTRAMUSCULAR | Status: AC
  Administered 2015-08-05: 20 mg via INTRAMUSCULAR
  Filled 2015-08-05 (×2): qty 2

## 2015-08-05 MED ORDER — DICYCLOMINE HCL 10 MG PO CAPS
ORAL_CAPSULE | ORAL | Status: AC
  Filled 2015-08-05: qty 2

## 2015-08-05 MED ORDER — GI COCKTAIL ~~LOC~~
ORAL | Status: AC
  Administered 2015-08-05: 30 mL via ORAL
  Filled 2015-08-05: qty 30

## 2015-08-05 MED ORDER — SUCRALFATE 1 G PO TABS: 1.0000 g | ORAL_TABLET | Freq: Four times a day (QID) | ORAL | Status: DC

## 2015-08-05 NOTE — ED Provider Notes (Signed)
[facility] Emergency Department Provider Note   ____________________________________________  Time seen: ~2130  I have reviewed the triage vital signs and the nursing notes.   HISTORY  Chief Complaint Abdominal Pain and Diarrhea   History limited by: Not Limited   HPI Michael Schmitt is a 45 y.o. male who presents to the emergency department today because of concerns for diarrhea, nausea and abdominal pain. The patient states that the symptoms have been present for the past4-5 days. He states that he feels like every time he eats it immediately comes out of the form of diarrhea. He states that when he eats he does also have the onset of abdominal pain. He describes it as being somewhat diffuse however does present worse perhaps in the lower quadrants. The patient denies any fevers. The patient does state he has been under a lot of stress recently. States he was recently charged with DUI. Additionally the patient states that he has no longer been getting the Percocet prescription he is used to. He denies any recent fevers.   Past Medical History  Diagnosis Date  . Hypertension   . Mental disorder   . Anxiety   . Shortness of breath   . RLS (restless legs syndrome)     detected on sleep study  . GERD (gastroesophageal reflux disease)   . Headache(784.0)   . Blood dyscrasia     told that when he was young he was a" free bleeder"  . Arthritis     cerv. stenosis, spondylosis, HNP- lower back , has been followed in pain clinic, has  had injection s in cerv. area  . Chronic kidney disease     renal calculi- passed spontaneously  . Anxiety   . Depression   . Diabetes mellitus without complication ([place])   . Cervicogenic headache 07/24/2014  . Cervical spondylosis without myelopathy 07/24/2014  . Fatty liver   . CAD (coronary artery disease)   . MI, old     Patient Active Problem List   Diagnosis Date Noted  . Major depressive disorder, recurrent  episode, moderate with anxious distress ([place]) 06/21/2015  . NSTEMI (non-ST elevated myocardial infarction) ([place]) 06/20/2015  . STEMI (ST elevation myocardial infarction) ([place]) 01/01/2015  . Acute MI, inferoposterior wall ([place]) 01/01/2015  . Common bile duct dilation 10/06/2014  . Right lower quadrant abdominal pain 10/06/2014  . Loss of weight 10/06/2014  . Cervicogenic headache 07/24/2014  . Cervical spondylosis without myelopathy 07/24/2014  . Clinical depression 04/15/2013  . Acid reflux 04/15/2013  . BP (high blood pressure) 04/15/2013  . Encounter for other preprocedural examination 04/15/2013  . Nasal obstruction 02/12/2013  . Deflected nasal septum 02/12/2013  . Inflamed nasal mucosa 02/12/2013  . Sinus infection 02/12/2013  . Gastric catarrh 03/01/2011  . Allergic rhinitis 10/20/2010  . Airway hyperreactivity 10/20/2010  . Anxiety, generalized 10/20/2010  . Cervical pain 10/20/2010  . Current tobacco use 10/20/2010    Past Surgical History  Procedure Laterality Date  . Nasal sinus surgery      2005  . Anterior cervical decomp/discectomy fusion  11/13/2011    Procedure: ANTERIOR CERVICAL DECOMPRESSION/DISCECTOMY FUSION 1 LEVEL;  Surgeon: [name], MD;  Location: [facility];  Service: Neurosurgery;  Laterality: N/A;  Anterior Cervical Decompression/discectomy Fusion. Cervical three-four.  . Cardiac catheterization N/A 01/01/2015    Procedure: Left Heart Cath and Coronary Angiography;  Surgeon: [name], MD;  Location: [facility];  Service: Cardiovascular;  Laterality: N/A;  Current Outpatient Rx  Name  Route  Sig  Dispense  Refill  . aspirin 81 MG chewable tablet   Oral   Chew 1 tablet (81 mg total) by mouth daily.   30 tablet   3   . atorvastatin (LIPITOR) 80 MG tablet   Oral   Take 1 tablet (80 mg total) by mouth daily at 6 PM.   30 tablet   3   . cetirizine (ZYRTEC) 10 MG tablet               . clindamycin (CLEOCIN) 300 MG capsule    Oral   Take 1 capsule (300 mg total) by mouth 3 (three) times daily.   30 capsule   0   . clonazePAM (KLONOPIN) 1 MG tablet   Oral   Take 1 mg by mouth 3 (three) times daily.          . clopidogrel (PLAVIX) 75 MG tablet               . glimepiride (AMARYL) 1 MG tablet      TAKE 1 TABLET(S) BY MOUTH DAILY WITH LARGEST MEAL OF THE DAY      3   . lisinopril (PRINIVIL,ZESTRIL) 5 MG tablet   Oral   Take 1 tablet (5 mg total) by mouth daily.   30 tablet   0   . metoprolol tartrate (LOPRESSOR) 25 MG tablet   Oral   Take 0.5 tablets (12.5 mg total) by mouth 2 (two) times daily.   30 tablet   3   . mirtazapine (REMERON) 15 MG tablet      TAKE 1 TABLET(S) BY MOUTH EVERY EVENING FOR DEPRESSION/INSOMNIA      3   . naproxen (NAPROSYN) 500 MG tablet   Oral   Take 1 tablet (500 mg total) by mouth 2 (two) times daily with a meal.   14 tablet   0   . nitroGLYCERIN (NITROSTAT) 0.4 MG SL tablet   Sublingual   Place 1 tablet (0.4 mg total) under the tongue every 5 (five) minutes x 3 doses as needed for chest pain.   25 tablet   12   . prasugrel (EFFIENT) 10 MG TABS tablet   Oral   Take 1 tablet (10 mg total) by mouth daily.   30 tablet   11     Allergies Prednisone; Hydrocodone; and Varenicline  Family History  Problem Relation Age of Onset  . Prostate cancer Father   . Depression Father   . Hypertension Mother   . Kidney Stones Mother   . Anxiety disorder Mother   . Depression Mother   . COPD Sister   . Hypertension Sister   . Diabetes Sister   . Depression Sister   . Anxiety disorder Sister   . Seizures Sister     Social History Social History  Substance Use Topics  . Smoking status: Current Every Day Smoker -- 1.50 packs/day for 17 years    Types: Cigarettes  . Smokeless tobacco: Never Used  . Alcohol Use: 7.2 oz/week    0 Glasses of wine, 12 Cans of beer, 0 Shots of liquor per week     Comment: beer- 1-2 x's per month     Review of  Systems  Constitutional: Negative for fever. Cardiovascular: Negative for chest pain. Respiratory: Negative for shortness of breath. Gastrointestinal: Positive for abdominal pain, nausea and diarrhea Neurological: Negative for headaches, focal weakness or numbness.   10-point ROS otherwise negative.  ____________________________________________   PHYSICAL EXAM:  VITAL SIGNS: ED Triage Vitals  Enc Vitals Group     BP 08/05/15 2026 139/74 mmHg     Pulse Rate 08/05/15 2026 83     Resp 08/05/15 2026 18     Temp 08/05/15 2026 98 F (36.7 C)     Temp src --      SpO2 08/05/15 2026 97 %     Weight 08/05/15 2026 204 lb (92.534 kg)     Height 08/05/15 2026 6\' 2"  (1.88 m)     Head Cir --      Peak Flow --      Pain Score 08/05/15 2027 8   Constitutional: Alert and oriented. Well appearing and in no distress. Eyes: Conjunctivae are normal. PERRL. Normal extraocular movements. ENT   Head: Normocephalic and atraumatic.   Nose: No congestion/rhinnorhea.   Mouth/Throat: Mucous membranes are moist.   Neck: No stridor. Hematological/Lymphatic/Immunilogical: No cervical lymphadenopathy. Cardiovascular: Normal rate, regular rhythm.  No murmurs, rubs, or gallops. Respiratory: Normal respiratory effort without tachypnea nor retractions. Breath sounds are clear and equal bilaterally. No wheezes/rales/rhonchi. Gastrointestinal: Soft and Somewhat diffusely mildly tender to palpation. Genitourinary: Deferred Musculoskeletal: Normal range of motion in all extremities. No joint effusions.  No lower extremity tenderness nor edema. Neurologic:  Normal speech and language. No gross focal neurologic deficits are appreciated.  Skin:  Skin is warm, dry and intact. No rash noted. Psychiatric: Mood and affect are normal. Speech and behavior are normal. Patient exhibits appropriate insight and judgment.  ____________________________________________    LABS (pertinent  positives/negatives)  Labs Reviewed  URINALYSIS COMPLETEWITH MICROSCOPIC ([place] ONLY) - Abnormal; Notable for the following:    Color, Urine YELLOW (*)    APPearance CLEAR (*)    Squamous Epithelial / LPF 0-5 (*)    All other components within normal limits  LIPASE, BLOOD  COMPREHENSIVE METABOLIC PANEL  CBC     ____________________________________________   EKG  None  ____________________________________________    RADIOLOGY  None  ____________________________________________   PROCEDURES  Procedure(s) performed: None  Critical Care performed: No  ____________________________________________   INITIAL IMPRESSION / ASSESSMENT AND PLAN / ED COURSE  Pertinent labs & imaging results that were available during my care of the patient were reviewed by me and considered in my medical decision making (see chart for details).  Patient presented to the emergency department today because of concerns for abdominal pain nausea and diarrhea. Patient has been under a lot of stress recently and does maintain that he has a lot of anxiety. Denies any SI or HI today. The patient abdominal exam has mild diffuse tenderness however no rebound or guarding. Blood work without any concerning leukocytosis. Will try symptomatic treatment and reassess.  ----------------------------------------- 10:27 PM on 08/05/2015 -----------------------------------------  Patient does state he feels better after medication. I think likely then his pain is secondary to GI upset essentially secondary to over-the-counter medication use. Discussed this with the patient. Will give patient prescription for medication. Will discharge home.  ____________________________________________   FINAL CLINICAL IMPRESSION(S) / ED DIAGNOSES  Final diagnoses:  Generalized abdominal pain     Nance Pear, MD 08/05/15 2228

## 2015-08-05 NOTE — ED Notes (Signed)
Pt reports he has had lower abdominal pain for weeks, loss of appetite and in the recent week has developed diarrhea. Pt states he use to take percocet and his doctor stopped the prescriptions and the pt has been taking 5 to 6 BC powders per day.

## 2015-08-05 NOTE — ED Notes (Signed)
Pt to ER with c/o abdominal pain everytime he eats, diarrhea, hemiroids, and low back pain.

## 2015-08-05 NOTE — Discharge Instructions (Signed)
Please seek medical attention for any high fevers, chest pain, shortness of breath, change in behavior, persistent vomiting, bloody stool or any other new or concerning symptoms.  Outpatient Psychiatry and Counseling  [facility]: Mobile Crisis Management 24 hours:  [phone]  [facility] of the [employer] sliding scale fee and walk in schedule: M-F 8am-12pm/1pm-3pm [place], [place] [address] [place] [place], [place] [address] [phone]  [facility] ([facility] [employer])- new patient walk-in appointments available Monday - Friday 8am -3pm.          [address] [place], [place] [address] [phone] or crisis line- [facility]/ Intensive Outpatient Therapy Program [place], [place] [address] [place]      [phone] [place], [place] [address]                 [place]   [facility] [phone]. [place], [place] [address]   [employer] of Care          [address] Johnette Abraham  [place], [place] [address]       [phone]  [facility] [address] [place], [place] [address] [phone]  [facility]    [address] [place], [place] [address]     [place], [place] Joycelyn Man     [place] [address]     [phone]       [facility] [place] [address]  [facility]     [address], [place], [place], [place] [address] [place]     [address]     [place], [place] [address]     [phone]       [facility] [address] [place], [place] [address] [phone] Resources for Temporary Residential Assistance/Crisis [facility]) M-F 8am-3pm   [address], [place] [address]   [phone] Services include: laundry, barbering, support groups, case management, phone  & computer access, showers, AA/NA mtgs, mental health/substance abuse nurse, job skills class, disability information, VA assistance, spiritual classes, etc.   [facility]   [address], [place]     [place]              [employer] (women and children)       [place]. [place], [place] [address] [phone] [email or web address] for application and process Application Required  [facility]   [address]    [place] [address]     [phone]                    [place],  [place] [address] [phone] [email or web address] application appt.) Application Required  [facility] (women only)    [place], [place] [address]     [phone]      Intake starts 6pm daily Need valid ID, SSC, & Police report [employer] [address] [place], [place] [id number] Application Required  [employer] (men only)     [place].      [place], [place]       [place] (Pregnant women only) [address] [place], [place]  [facility]      [place] [address].      [place], [place] [address]     [phone]             [facility] [address] [place], [place] 90 day commitment/SA/Application process  [facility](men only)     [address]     [place], [place]       Check-in at [place] of [facility] [address] [place], [place] [address] [phone] Men/Women/Women and Children must be there by 7 pm  [place], [place]

## 2015-08-08 ENCOUNTER — Emergency Department: Payer: Medicaid Other

## 2015-08-08 ENCOUNTER — Encounter: Payer: Self-pay | Admitting: Emergency Medicine

## 2015-08-08 ENCOUNTER — Emergency Department
Admission: EM | Admit: 2015-08-08 | Discharge: 2015-08-08 | Disposition: A | Discharge status: Home / Self Care | Payer: Medicaid Other | Attending: Emergency Medicine | Admitting: Emergency Medicine

## 2015-08-08 DIAGNOSIS — F329 Major depressive disorder, single episode, unspecified: Secondary | ICD-10-CM | POA: Insufficient documentation

## 2015-08-08 DIAGNOSIS — I2510 Atherosclerotic heart disease of native coronary artery without angina pectoris: Secondary | ICD-10-CM | POA: Diagnosis not present

## 2015-08-08 DIAGNOSIS — M1990 Unspecified osteoarthritis, unspecified site: Secondary | ICD-10-CM | POA: Insufficient documentation

## 2015-08-08 DIAGNOSIS — I252 Old myocardial infarction: Secondary | ICD-10-CM | POA: Diagnosis not present

## 2015-08-08 DIAGNOSIS — Z7982 Long term (current) use of aspirin: Secondary | ICD-10-CM | POA: Insufficient documentation

## 2015-08-08 DIAGNOSIS — R1084 Generalized abdominal pain: Secondary | ICD-10-CM | POA: Diagnosis present

## 2015-08-08 DIAGNOSIS — Z79899 Other long term (current) drug therapy: Secondary | ICD-10-CM | POA: Insufficient documentation

## 2015-08-08 DIAGNOSIS — F17210 Nicotine dependence, cigarettes, uncomplicated: Secondary | ICD-10-CM | POA: Insufficient documentation

## 2015-08-08 DIAGNOSIS — I129 Hypertensive chronic kidney disease with stage 1 through stage 4 chronic kidney disease, or unspecified chronic kidney disease: Secondary | ICD-10-CM | POA: Diagnosis not present

## 2015-08-08 DIAGNOSIS — N189 Chronic kidney disease, unspecified: Secondary | ICD-10-CM | POA: Insufficient documentation

## 2015-08-08 DIAGNOSIS — E119 Type 2 diabetes mellitus without complications: Secondary | ICD-10-CM | POA: Insufficient documentation

## 2015-08-08 LAB — COMPREHENSIVE METABOLIC PANEL
ALK PHOS: 97 U/L (ref 38–126)
ALT: 28 U/L (ref 17–63)
AST: 25 U/L (ref 15–41)
Albumin: 4.3 g/dL (ref 3.5–5.0)
Anion gap: 5 (ref 5–15)
BUN: 14 mg/dL (ref 6–20)
CALCIUM: 9.3 mg/dL (ref 8.9–10.3)
CHLORIDE: 105 mmol/L (ref 101–111)
CO2: 29 mmol/L (ref 22–32)
Creatinine, Ser: 0.99 mg/dL (ref 0.61–1.24)
GFR calc non Af Amer: >60 mL/min (ref >60)
GLUCOSE: 115 mg/dL — AB (ref 65–99)
Potassium: 3.7 mmol/L (ref 3.5–5.1)
SODIUM: 139 mmol/L (ref 135–145)
TOTAL PROTEIN: 7.3 g/dL (ref 6.5–8.1)
Total Bilirubin: 0.3 mg/dL (ref 0.3–1.2)

## 2015-08-08 LAB — LIPASE, BLOOD: Lipase: 30 U/L (ref 11–51)

## 2015-08-08 LAB — CBC WITH DIFFERENTIAL/PLATELET
BASOS ABS: 0.1 10*3/uL (ref 0–0.1)
Basophils Relative: 1 %
Eosinophils Absolute: 0.2 10*3/uL (ref 0–0.7)
HEMATOCRIT: 43.9 % (ref 40.0–52.0)
Hemoglobin: 14.8 g/dL (ref 13.0–18.0)
Lymphocytes Relative: 16 %
Lymphs Abs: 1.9 10*3/uL (ref 1.0–3.6)
MCH: 30.4 pg (ref 26.0–34.0)
MCHC: 33.8 g/dL (ref 32.0–36.0)
MCV: 89.9 fL (ref 80.0–100.0)
MONO ABS: 0.8 10*3/uL (ref 0.2–1.0)
Monocytes Relative: 6 %
NEUTROS ABS: 9.4 10*3/uL — AB (ref 1.4–6.5)
Neutrophils Relative %: 76 %
PLATELETS: 231 10*3/uL (ref 150–440)
RBC: 4.89 MIL/uL (ref 4.40–5.90)
RDW: 13.7 % (ref 11.5–14.5)
WBC: 12.4 10*3/uL — AB (ref 3.8–10.6)

## 2015-08-08 MED ORDER — DICYCLOMINE HCL 10 MG/ML IM SOLN
INTRAMUSCULAR | Status: AC
  Administered 2015-08-08: 20 mg via INTRAMUSCULAR
  Filled 2015-08-08: qty 2

## 2015-08-08 MED ORDER — DICYCLOMINE HCL 10 MG/ML IM SOLN
20.0000 mg | Freq: Once | INTRAMUSCULAR | Status: AC
  Administered 2015-08-08: 20 mg via INTRAMUSCULAR

## 2015-08-08 MED ORDER — IOPAMIDOL (ISOVUE-300) INJECTION 61%
100.0000 mL | Freq: Once | INTRAVENOUS | Status: AC | PRN
  Administered 2015-08-08: 100 mL via INTRAVENOUS

## 2015-08-08 MED ORDER — DIATRIZOATE MEGLUMINE & SODIUM 66-10 % PO SOLN
15.0000 mL | Freq: Once | ORAL | Status: AC
  Administered 2015-08-08: 15 mL via ORAL

## 2015-08-08 NOTE — ED Provider Notes (Signed)
[facility] Emergency Department Provider Note   ____________________________________________  Time seen: ~1230  I have reviewed the triage vital signs and the nursing notes.   HISTORY  Chief Complaint Abdominal Pain   History limited by: Not Limited   HPI Michael Schmitt is a 45 y.o. male who read presents to the emergency department today because of continued and worsening abdominal pain. I did evaluate the patient when he was in the emergency department 3 days ago. He states that he has continued to have abdominal pain. He does describe as being diffuse. He describes it as being severe. He states he is still taking Excedrin. He denies any bloody stool. No vomiting. No fevers.   Past Medical History  Diagnosis Date  . Hypertension   . Mental disorder   . Anxiety   . Shortness of breath   . RLS (restless legs syndrome)     detected on sleep study  . GERD (gastroesophageal reflux disease)   . Headache(784.0)   . Blood dyscrasia     told that when he was young he was a" free bleeder"  . Arthritis     cerv. stenosis, spondylosis, HNP- lower back , has been followed in pain clinic, has  had injection s in cerv. area  . Chronic kidney disease     renal calculi- passed spontaneously  . Anxiety   . Depression   . Diabetes mellitus without complication ([place])   . Cervicogenic headache 07/24/2014  . Cervical spondylosis without myelopathy 07/24/2014  . Fatty liver   . CAD (coronary artery disease)   . MI, old     Patient Active Problem List   Diagnosis Date Noted  . Major depressive disorder, recurrent episode, moderate with anxious distress ([place]) 06/21/2015  . NSTEMI (non-ST elevated myocardial infarction) ([place]) 06/20/2015  . STEMI (ST elevation myocardial infarction) ([place]) 01/01/2015  . Acute MI, inferoposterior wall ([place]) 01/01/2015  . Common bile duct dilation 10/06/2014  . Right lower quadrant abdominal pain 10/06/2014  . Loss of weight  10/06/2014  . Cervicogenic headache 07/24/2014  . Cervical spondylosis without myelopathy 07/24/2014  . Clinical depression 04/15/2013  . Acid reflux 04/15/2013  . BP (high blood pressure) 04/15/2013  . Encounter for other preprocedural examination 04/15/2013  . Nasal obstruction 02/12/2013  . Deflected nasal septum 02/12/2013  . Inflamed nasal mucosa 02/12/2013  . Sinus infection 02/12/2013  . Gastric catarrh 03/01/2011  . Allergic rhinitis 10/20/2010  . Airway hyperreactivity 10/20/2010  . Anxiety, generalized 10/20/2010  . Cervical pain 10/20/2010  . Current tobacco use 10/20/2010    Past Surgical History  Procedure Laterality Date  . Nasal sinus surgery      2005  . Anterior cervical decomp/discectomy fusion  11/13/2011    Procedure: ANTERIOR CERVICAL DECOMPRESSION/DISCECTOMY FUSION 1 LEVEL;  Surgeon: [name], MD;  Location: [facility];  Service: Neurosurgery;  Laterality: N/A;  Anterior Cervical Decompression/discectomy Fusion. Cervical three-four.  . Cardiac catheterization N/A 01/01/2015    Procedure: Left Heart Cath and Coronary Angiography;  Surgeon: [name], MD;  Location: [facility];  Service: Cardiovascular;  Laterality: N/A;    Current Outpatient Rx  Name  Route  Sig  Dispense  Refill  . aspirin 81 MG chewable tablet   Oral   Chew 1 tablet (81 mg total) by mouth daily.   30 tablet   3   . atorvastatin (LIPITOR) 80 MG tablet   Oral   Take 1 tablet (80 mg total)  by mouth daily at 6 PM.   30 tablet   3   . cetirizine (ZYRTEC) 10 MG tablet               . clindamycin (CLEOCIN) 300 MG capsule   Oral   Take 1 capsule (300 mg total) by mouth 3 (three) times daily.   30 capsule   0   . clonazePAM (KLONOPIN) 1 MG tablet   Oral   Take 1 mg by mouth 3 (three) times daily.          . clopidogrel (PLAVIX) 75 MG tablet               . dicyclomine (BENTYL) 20 MG tablet   Oral   Take 1 tablet (20 mg total) by mouth 3 (three) times  daily as needed (abdominal pain).   30 tablet   0   . glimepiride (AMARYL) 1 MG tablet      TAKE 1 TABLET(S) BY MOUTH DAILY WITH LARGEST MEAL OF THE DAY      3   . lisinopril (PRINIVIL,ZESTRIL) 5 MG tablet   Oral   Take 1 tablet (5 mg total) by mouth daily.   30 tablet   0   . metoprolol tartrate (LOPRESSOR) 25 MG tablet   Oral   Take 0.5 tablets (12.5 mg total) by mouth 2 (two) times daily.   30 tablet   3   . mirtazapine (REMERON) 15 MG tablet      TAKE 1 TABLET(S) BY MOUTH EVERY EVENING FOR DEPRESSION/INSOMNIA      3   . naproxen (NAPROSYN) 500 MG tablet   Oral   Take 1 tablet (500 mg total) by mouth 2 (two) times daily with a meal.   14 tablet   0   . nitroGLYCERIN (NITROSTAT) 0.4 MG SL tablet   Sublingual   Place 1 tablet (0.4 mg total) under the tongue every 5 (five) minutes x 3 doses as needed for chest pain.   25 tablet   12   . prasugrel (EFFIENT) 10 MG TABS tablet   Oral   Take 1 tablet (10 mg total) by mouth daily.   30 tablet   11   . ranitidine (ZANTAC) 150 MG tablet   Oral   Take 1 tablet (150 mg total) by mouth at bedtime.   30 tablet   1   . sucralfate (CARAFATE) 1 g tablet   Oral   Take 1 tablet (1 g total) by mouth 4 (four) times daily.   60 tablet   0     Allergies Prednisone; Hydrocodone; and Varenicline  Family History  Problem Relation Age of Onset  . Prostate cancer Father   . Depression Father   . Hypertension Mother   . Kidney Stones Mother   . Anxiety disorder Mother   . Depression Mother   . COPD Sister   . Hypertension Sister   . Diabetes Sister   . Depression Sister   . Anxiety disorder Sister   . Seizures Sister     Social History Social History  Substance Use Topics  . Smoking status: Current Every Day Smoker -- 1.50 packs/day for 17 years    Types: Cigarettes  . Smokeless tobacco: Never Used  . Alcohol Use: 7.2 oz/week    0 Glasses of wine, 12 Cans of beer, 0 Shots of liquor per week     Comment:  beer- 1-2 x's per month     Review of Systems  Constitutional: Negative for fever. Cardiovascular: Negative for chest pain. Respiratory: Negative for shortness of breath. Gastrointestinal: Positive for abdominal pain. Neurological: Negative for headaches, focal weakness or numbness.  10-point ROS otherwise negative.  ____________________________________________   PHYSICAL EXAM:  VITAL SIGNS: ED Triage Vitals  Enc Vitals Group     BP 08/08/15 1141 149/97 mmHg     Pulse Rate 08/08/15 1141 70     Resp 08/08/15 1141 18     Temp 08/08/15 1141 97.7 F (36.5 C)     Temp Source 08/08/15 1141 Oral     SpO2 --      Weight 08/08/15 1141 204 lb (92.534 kg)     Height 08/08/15 1141 6\' 2"  (1.88 m)     Head Cir --      Peak Flow --      Pain Score 08/08/15 1139 10   Constitutional: Alert and oriented. Well appearing and in no distress. Eyes: Conjunctivae are normal. PERRL. Normal extraocular movements. ENT   Head: Normocephalic and atraumatic.   Nose: No congestion/rhinnorhea.   Mouth/Throat: Mucous membranes are moist.   Neck: No stridor. Hematological/Lymphatic/Immunilogical: No cervical lymphadenopathy. Cardiovascular: Normal rate, regular rhythm.  No murmurs, rubs, or gallops. Respiratory: Normal respiratory effort without tachypnea nor retractions. Breath sounds are clear and equal bilaterally. No wheezes/rales/rhonchi. Gastrointestinal: Soft and Minimally tender diffusely. No rebound. No guarding. No distention. Genitourinary: Deferred Musculoskeletal: Normal range of motion in all extremities. No joint effusions.  No lower extremity tenderness nor edema. Neurologic:  Normal speech and language. No gross focal neurologic deficits are appreciated.  Skin:  Skin is warm, dry and intact. No rash noted. Psychiatric: Mood and affect are normal. Speech and behavior are normal. Patient exhibits appropriate insight and  judgment.  ____________________________________________    LABS (pertinent positives/negatives)  Labs Reviewed  COMPREHENSIVE METABOLIC PANEL - Abnormal; Notable for the following:    Glucose, Bld 115 (*)    All other components within normal limits  CBC WITH DIFFERENTIAL/PLATELET - Abnormal; Notable for the following:    WBC 12.4 (*)    Neutro Abs 9.4 (*)    All other components within normal limits  LIPASE, BLOOD     ____________________________________________   EKG  I, Nance Pear, attending physician, personally viewed and interpreted this EKG  EKG Time: 1142 Rate: 75 Rhythm: normal sinus rhythm Axis: normal Intervals: qtc 418 QRS: narrow, RSR' in V1, V2 ST changes: no st elevation Impression: abnormal ekg  ____________________________________________    RADIOLOGY  CT abd/pel  IMPRESSION: 1. No evidence of acute inflammatory process within abdomen or pelvis. 2. No hydronephrosis or hydroureter. 3. No small bowel obstruction. 4. Normal appendix. No pericecal inflammation.   ____________________________________________   PROCEDURES  Procedure(s) performed: None  Critical Care performed: No  ____________________________________________   INITIAL IMPRESSION / ASSESSMENT AND PLAN / ED COURSE  Pertinent labs & imaging results that were available during my care of the patient were reviewed by me and considered in my medical decision making (see chart for details).  Patient presented to the emergency department today because of concerns for continued abdominal pain. I did examine the patient read days ago for similar complaints. Today's physical exam is not significantly changed from the exam 3 days ago. Blood work did show however a leukocytosis today. Patient states he is continue to take over-the-counter pain medications. Again I discussed with the patient that I think this could be contributing to the patient's symptoms. Will obtain a CT scan  today however to  evaluate for other intra-abdominal pathology.  ----------------------------------------- 3:21 PM on 08/08/2015 -----------------------------------------  CT scan without any concerning pathology. I did discuss again with patient importance of stopping over-the-counter medications. I did state that Tylenol would be the best for him. Started the patient to continue to take the antacids and sucralfate prescribed at the last visit. Will give GI follow up.  ____________________________________________   FINAL CLINICAL IMPRESSION(S) / ED DIAGNOSES  Final diagnoses:  Generalized abdominal pain     Nance Pear, MD 08/08/15 1521

## 2015-08-08 NOTE — ED Notes (Signed)
Pt called out reporting pain in right side of abd and increased amount of urination. MD made aware.

## 2015-08-08 NOTE — ED Notes (Signed)
Patient transported to CT 

## 2015-08-08 NOTE — ED Notes (Signed)
MD at bedside. 

## 2015-08-08 NOTE — Discharge Instructions (Signed)
Please seek medical attention for any high fevers, chest pain, shortness of breath, change in behavior, persistent vomiting, bloody stool or any other new or concerning symptoms. ° ° °Abdominal Pain, Adult °Many things can cause belly (abdominal) pain. Most times, the belly pain is not dangerous. Many cases of belly pain can be watched and treated at home. °HOME CARE  °· Do not take medicines that help you go poop (laxatives) unless told to by your doctor. °· Only take medicine as told by your doctor. °· Eat or drink as told by your doctor. Your doctor will tell you if you should be on a special diet. °GET HELP IF: °· You do not know what is causing your belly pain. °· You have belly pain while you are sick to your stomach (nauseous) or have runny poop (diarrhea). °· You have pain while you pee or poop. °· Your belly pain wakes you up at night. °· You have belly pain that gets worse or better when you eat. °· You have belly pain that gets worse when you eat fatty foods. °· You have a fever. °GET HELP RIGHT AWAY IF:  °· The pain does not go away within 2 hours. °· You keep throwing up (vomiting). °· The pain changes and is only in the right or left part of the belly. °· You have bloody or tarry looking poop. °MAKE SURE YOU:  °· Understand these instructions. °· Will watch your condition. °· Will get help right away if you are not doing well or get worse. °  °This information is not intended to replace advice given to you by your health care provider. Make sure you discuss any questions you have with your health care provider. °  °Document Released: 08/30/2007 Document Revised: 04/03/2014 Document Reviewed: 11/20/2012 °Elsevier Interactive Patient Education ©2016 [employer] ° °

## 2015-08-08 NOTE — ED Notes (Signed)
Seen in ED Thursday night for c/o abdominal pain.  Pain continues and worsening.  C/O generalized abdominal pain, headache, and chills.

## 2015-08-08 NOTE — ED Notes (Addendum)
Pt up to use restroom. Pt able to ambulate independently and without difficulty.

## 2015-08-10 ENCOUNTER — Emergency Department
Admission: EM | Admit: 2015-08-10 | Discharge: 2015-08-10 | Disposition: A | Discharge status: Home / Self Care | Payer: Medicaid Other | Attending: Emergency Medicine | Admitting: Emergency Medicine

## 2015-08-10 ENCOUNTER — Encounter: Payer: Self-pay | Admitting: Emergency Medicine

## 2015-08-10 DIAGNOSIS — Z5321 Procedure and treatment not carried out due to patient leaving prior to being seen by health care provider: Secondary | ICD-10-CM | POA: Insufficient documentation

## 2015-08-10 DIAGNOSIS — Z7982 Long term (current) use of aspirin: Secondary | ICD-10-CM | POA: Diagnosis not present

## 2015-08-10 DIAGNOSIS — F17210 Nicotine dependence, cigarettes, uncomplicated: Secondary | ICD-10-CM | POA: Insufficient documentation

## 2015-08-10 DIAGNOSIS — F329 Major depressive disorder, single episode, unspecified: Secondary | ICD-10-CM | POA: Diagnosis present

## 2015-08-10 LAB — URINALYSIS COMPLETE WITH MICROSCOPIC (ARMC ONLY)
BILIRUBIN URINE: NEGATIVE
Bacteria, UA: NONE SEEN
GLUCOSE, UA: NEGATIVE mg/dL
HGB URINE DIPSTICK: NEGATIVE
LEUKOCYTES UA: NEGATIVE
NITRITE: NEGATIVE
Protein, ur: NEGATIVE mg/dL
RBC / HPF: NONE SEEN RBC/hpf (ref 0–5)
Specific Gravity, Urine: 1.015 (ref 1.005–1.030)
pH: 5 (ref 5.0–8.0)

## 2015-08-10 LAB — COMPREHENSIVE METABOLIC PANEL
ALK PHOS: 96 U/L (ref 38–126)
ALT: 28 U/L (ref 17–63)
AST: 29 U/L (ref 15–41)
Albumin: 4.5 g/dL (ref 3.5–5.0)
Anion gap: 9 (ref 5–15)
BILIRUBIN TOTAL: 0.4 mg/dL (ref 0.3–1.2)
BUN: 12 mg/dL (ref 6–20)
CO2: 26 mmol/L (ref 22–32)
CREATININE: 1.03 mg/dL (ref 0.61–1.24)
Calcium: 9.7 mg/dL (ref 8.9–10.3)
Chloride: 104 mmol/L (ref 101–111)
GFR calc Af Amer: >60 mL/min (ref >60)
Glucose, Bld: 95 mg/dL (ref 65–99)
Potassium: 4.2 mmol/L (ref 3.5–5.1)
Sodium: 139 mmol/L (ref 135–145)
TOTAL PROTEIN: 7.5 g/dL (ref 6.5–8.1)

## 2015-08-10 LAB — CBC
HCT: 46.4 % (ref 40.0–52.0)
Hemoglobin: 15.2 g/dL (ref 13.0–18.0)
MCH: 30 pg (ref 26.0–34.0)
MCHC: 32.8 g/dL (ref 32.0–36.0)
MCV: 91.5 fL (ref 80.0–100.0)
PLATELETS: 234 10*3/uL (ref 150–440)
RBC: 5.07 MIL/uL (ref 4.40–5.90)
RDW: 14.4 % (ref 11.5–14.5)
WBC: 15.4 10*3/uL — AB (ref 3.8–10.6)

## 2015-08-10 LAB — LIPASE, BLOOD: LIPASE: 30 U/L (ref 11–51)

## 2015-08-10 NOTE — ED Notes (Addendum)
Pt to ed with c/o abd pain and vomiting.  Pt states he was seen here last week for same.  States he was told he has an ulcer but has not been able to keep food down.  Pt states he is also having severe anxiety and depression and his meds are not working anymore. Pt denies si, denies hi, denies hallucinations.  Pt states he recently got a dwi.  Reports he is very anxious about this.

## 2015-08-10 NOTE — ED Notes (Signed)
Last call for patient without answer.

## 2015-08-10 NOTE — ED Notes (Signed)
Pt called to room without answer.  

## 2015-08-10 NOTE — ED Notes (Signed)
Pt called to room without answer. Looked in lobby for patient.

## 2015-08-11 ENCOUNTER — Emergency Department
Admission: EM | Admit: 2015-08-11 | Discharge: 2015-08-11 | Disposition: A | Discharge status: Home / Self Care | Payer: Medicaid Other | Attending: Emergency Medicine | Admitting: Emergency Medicine

## 2015-08-11 ENCOUNTER — Emergency Department: Payer: Medicaid Other

## 2015-08-11 ENCOUNTER — Encounter: Payer: Self-pay | Admitting: Emergency Medicine

## 2015-08-11 DIAGNOSIS — F419 Anxiety disorder, unspecified: Secondary | ICD-10-CM | POA: Diagnosis not present

## 2015-08-11 DIAGNOSIS — N189 Chronic kidney disease, unspecified: Secondary | ICD-10-CM | POA: Diagnosis not present

## 2015-08-11 DIAGNOSIS — Z7984 Long term (current) use of oral hypoglycemic drugs: Secondary | ICD-10-CM | POA: Diagnosis not present

## 2015-08-11 DIAGNOSIS — I2510 Atherosclerotic heart disease of native coronary artery without angina pectoris: Secondary | ICD-10-CM | POA: Diagnosis not present

## 2015-08-11 DIAGNOSIS — F331 Major depressive disorder, recurrent, moderate: Secondary | ICD-10-CM | POA: Insufficient documentation

## 2015-08-11 DIAGNOSIS — Z79899 Other long term (current) drug therapy: Secondary | ICD-10-CM | POA: Diagnosis not present

## 2015-08-11 DIAGNOSIS — E1122 Type 2 diabetes mellitus with diabetic chronic kidney disease: Secondary | ICD-10-CM | POA: Diagnosis not present

## 2015-08-11 DIAGNOSIS — R109 Unspecified abdominal pain: Secondary | ICD-10-CM | POA: Diagnosis present

## 2015-08-11 DIAGNOSIS — I252 Old myocardial infarction: Secondary | ICD-10-CM | POA: Diagnosis not present

## 2015-08-11 DIAGNOSIS — Z7982 Long term (current) use of aspirin: Secondary | ICD-10-CM | POA: Diagnosis not present

## 2015-08-11 DIAGNOSIS — F17210 Nicotine dependence, cigarettes, uncomplicated: Secondary | ICD-10-CM | POA: Insufficient documentation

## 2015-08-11 DIAGNOSIS — R079 Chest pain, unspecified: Secondary | ICD-10-CM | POA: Diagnosis not present

## 2015-08-11 DIAGNOSIS — I129 Hypertensive chronic kidney disease with stage 1 through stage 4 chronic kidney disease, or unspecified chronic kidney disease: Secondary | ICD-10-CM | POA: Insufficient documentation

## 2015-08-11 DIAGNOSIS — Z791 Long term (current) use of non-steroidal anti-inflammatories (NSAID): Secondary | ICD-10-CM | POA: Diagnosis not present

## 2015-08-11 LAB — CBC
HEMATOCRIT: 44.7 % (ref 40.0–52.0)
HEMOGLOBIN: 15.5 g/dL (ref 13.0–18.0)
MCH: 30.8 pg (ref 26.0–34.0)
MCHC: 34.6 g/dL (ref 32.0–36.0)
MCV: 89 fL (ref 80.0–100.0)
Platelets: 234 10*3/uL (ref 150–440)
RBC: 5.02 MIL/uL (ref 4.40–5.90)
RDW: 13.9 % (ref 11.5–14.5)
WBC: 10.5 10*3/uL (ref 3.8–10.6)

## 2015-08-11 LAB — BASIC METABOLIC PANEL
Anion gap: 10 (ref 5–15)
BUN: 11 mg/dL (ref 6–20)
CALCIUM: 9.8 mg/dL (ref 8.9–10.3)
CHLORIDE: 104 mmol/L (ref 101–111)
CO2: 26 mmol/L (ref 22–32)
Creatinine, Ser: 1.03 mg/dL (ref 0.61–1.24)
GFR calc Af Amer: >60 mL/min (ref >60)
GLUCOSE: 102 mg/dL — AB (ref 65–99)
POTASSIUM: 3.4 mmol/L — AB (ref 3.5–5.1)
Sodium: 140 mmol/L (ref 135–145)

## 2015-08-11 LAB — HEPATIC FUNCTION PANEL
ALT: 26 U/L (ref 17–63)
AST: 28 U/L (ref 15–41)
Albumin: 4.6 g/dL (ref 3.5–5.0)
Alkaline Phosphatase: 101 U/L (ref 38–126)
TOTAL PROTEIN: 7.6 g/dL (ref 6.5–8.1)
Total Bilirubin: 0.7 mg/dL (ref 0.3–1.2)

## 2015-08-11 LAB — TROPONIN I: Troponin I: <0.03 ng/mL (ref <0.031)

## 2015-08-11 LAB — LIPASE, BLOOD: LIPASE: 28 U/L (ref 11–51)

## 2015-08-11 MED ORDER — ESCITALOPRAM OXALATE 10 MG PO TABS: 10.0000 mg | ORAL_TABLET | Freq: Every day | ORAL | Status: DC

## 2015-08-11 MED ORDER — HYDROXYZINE HCL 25 MG PO TABS
25.0000 mg | ORAL_TABLET | Freq: Once | ORAL | Status: AC
  Administered 2015-08-11: 25 mg via ORAL

## 2015-08-11 MED ORDER — HYDROXYZINE HCL 25 MG PO TABS
ORAL_TABLET | ORAL | Status: AC
  Administered 2015-08-11: 25 mg via ORAL
  Filled 2015-08-11: qty 1

## 2015-08-11 MED ORDER — MIRTAZAPINE 45 MG PO TABS: 45.0000 mg | ORAL_TABLET | Freq: Every day | ORAL | Status: DC

## 2015-08-11 NOTE — ED Provider Notes (Signed)
Ultrasound unremarkable. Vital signs remain stable and normal. Patient calm and comfortable. Low suspicion for acute intra-abdominal pathology. Patient is medically stable at this time to proceed with psychiatry evaluation. We'll move to the behavioral health unit area of the emergency department.  Carrie Mew, MD 08/11/15 [phone]

## 2015-08-11 NOTE — ED Notes (Signed)
Pt to go home with sis ter and 2 rx

## 2015-08-11 NOTE — ED Notes (Signed)
Pt expressing significant depression and anxiety related to recent DUI, upcoming court date, loss of financial assistance, fear of losing his kids, and other life stressors.  Pt states he does not want to go home.  Pt told he cannot be admitted unless he meets certain criteria.  Pt expressing interest to go to behavioral medicine.  Dr. Dahlia Client to be informed.

## 2015-08-11 NOTE — ED Provider Notes (Signed)
Case discussed with [name] of psychiatry after his evaluation of the patient. He feels the patient is psychiatrically stable, not a danger to himself or others. He feels that this is related to the patient's chronic anxiety and desire for more benzodiazepine medications. The patient has not followed up with his psychiatrist due to financial concerns and has not been able to get refills of his benzodiazepines on an outpatient basis so he ultimately presented to the emergency department in an attempt to gain a refill. [name] plans to write prescriptions for Remeron and recommends patient follow up with [facility] or Dr. Kasandra Knudsen his psychiatrist.  Carrie Mew, MD 08/11/15 [phone]

## 2015-08-11 NOTE — ED Provider Notes (Signed)
[facility] Emergency Department Provider Note   ____________________________________________  Time seen: Approximately 530 AM  I have reviewed the triage vital signs and the nursing notes.   HISTORY  Chief Complaint Abdominal Pain and Chest Pain    HPI Michael Schmitt is a 45 y.o. male who comes into the hospital today with multiple symptoms. He reports he's been having a lot of panic attacks and he's been having abdominal pain. He reports that the abdominal pain has been going on for over a week. He has been to the hospital twice and reports that he had blood work initially administered CT scan which is negative. He reports that it sharp pain in his right side. He also has been straining to urinate and feels that that makes his abdominal pain worse. He's been taking a lot of green powders as well as some Tylenol for his chronic back pain. He reports that nothing makes his symptoms better or worse. He's vomited the other day after drinking and ensure. He reports that he is only eats once a day and does need much. He denies any alcohol or drugs. He did have some diarrhea but it improved. The patient has not had any fevers. He reports that he does have some panic attacks no every single day and is affecting his quality of life.   Past Medical History  Diagnosis Date  . Hypertension   . Mental disorder   . Anxiety   . Shortness of breath   . RLS (restless legs syndrome)     detected on sleep study  . GERD (gastroesophageal reflux disease)   . Headache(784.0)   . Blood dyscrasia     told that when he was young he was a" free bleeder"  . Arthritis     cerv. stenosis, spondylosis, HNP- lower back , has been followed in pain clinic, has  had injection s in cerv. area  . Chronic kidney disease     renal calculi- passed spontaneously  . Anxiety   . Depression   . Diabetes mellitus without complication ([place])   . Cervicogenic headache 07/24/2014  . Cervical  spondylosis without myelopathy 07/24/2014  . Fatty liver   . CAD (coronary artery disease)   . MI, old     Patient Active Problem List   Diagnosis Date Noted  . Major depressive disorder, recurrent episode, moderate with anxious distress ([place]) 06/21/2015  . NSTEMI (non-ST elevated myocardial infarction) ([place]) 06/20/2015  . STEMI (ST elevation myocardial infarction) ([place]) 01/01/2015  . Acute MI, inferoposterior wall ([place]) 01/01/2015  . Common bile duct dilation 10/06/2014  . Right lower quadrant abdominal pain 10/06/2014  . Loss of weight 10/06/2014  . Cervicogenic headache 07/24/2014  . Cervical spondylosis without myelopathy 07/24/2014  . Clinical depression 04/15/2013  . Acid reflux 04/15/2013  . BP (high blood pressure) 04/15/2013  . Encounter for other preprocedural examination 04/15/2013  . Nasal obstruction 02/12/2013  . Deflected nasal septum 02/12/2013  . Inflamed nasal mucosa 02/12/2013  . Sinus infection 02/12/2013  . Gastric catarrh 03/01/2011  . Allergic rhinitis 10/20/2010  . Airway hyperreactivity 10/20/2010  . Anxiety, generalized 10/20/2010  . Cervical pain 10/20/2010  . Current tobacco use 10/20/2010    Past Surgical History  Procedure Laterality Date  . Nasal sinus surgery      2005  . Anterior cervical decomp/discectomy fusion  11/13/2011    Procedure: ANTERIOR CERVICAL DECOMPRESSION/DISCECTOMY FUSION 1 LEVEL;  Surgeon: [name], MD;  Location: [facility];  Service: Neurosurgery;  Laterality: N/A;  Anterior Cervical Decompression/discectomy Fusion. Cervical three-four.  . Cardiac catheterization N/A 01/01/2015    Procedure: Left Heart Cath and Coronary Angiography;  Surgeon: [name], MD;  Location: [facility];  Service: Cardiovascular;  Laterality: N/A;    Current Outpatient Rx  Name  Route  Sig  Dispense  Refill  . aspirin 81 MG chewable tablet   Oral   Chew 1 tablet (81 mg total) by mouth daily.   30 tablet   3   . atorvastatin  (LIPITOR) 80 MG tablet   Oral   Take 1 tablet (80 mg total) by mouth daily at 6 PM.   30 tablet   3   . cetirizine (ZYRTEC) 10 MG tablet               . clindamycin (CLEOCIN) 300 MG capsule   Oral   Take 1 capsule (300 mg total) by mouth 3 (three) times daily.   30 capsule   0   . clonazePAM (KLONOPIN) 1 MG tablet   Oral   Take 1 mg by mouth 3 (three) times daily.          . clopidogrel (PLAVIX) 75 MG tablet               . dicyclomine (BENTYL) 20 MG tablet   Oral   Take 1 tablet (20 mg total) by mouth 3 (three) times daily as needed (abdominal pain).   30 tablet   0   . glimepiride (AMARYL) 1 MG tablet      TAKE 1 TABLET(S) BY MOUTH DAILY WITH LARGEST MEAL OF THE DAY      3   . lisinopril (PRINIVIL,ZESTRIL) 5 MG tablet   Oral   Take 1 tablet (5 mg total) by mouth daily.   30 tablet   0   . metoprolol tartrate (LOPRESSOR) 25 MG tablet   Oral   Take 0.5 tablets (12.5 mg total) by mouth 2 (two) times daily.   30 tablet   3   . mirtazapine (REMERON) 15 MG tablet      TAKE 1 TABLET(S) BY MOUTH EVERY EVENING FOR DEPRESSION/INSOMNIA      3   . naproxen (NAPROSYN) 500 MG tablet   Oral   Take 1 tablet (500 mg total) by mouth 2 (two) times daily with a meal.   14 tablet   0   . nitroGLYCERIN (NITROSTAT) 0.4 MG SL tablet   Sublingual   Place 1 tablet (0.4 mg total) under the tongue every 5 (five) minutes x 3 doses as needed for chest pain.   25 tablet   12   . prasugrel (EFFIENT) 10 MG TABS tablet   Oral   Take 1 tablet (10 mg total) by mouth daily.   30 tablet   11   . ranitidine (ZANTAC) 150 MG tablet   Oral   Take 1 tablet (150 mg total) by mouth at bedtime.   30 tablet   1   . sucralfate (CARAFATE) 1 g tablet   Oral   Take 1 tablet (1 g total) by mouth 4 (four) times daily.   60 tablet   0     Allergies Prednisone; Hydrocodone; and Varenicline  Family History  Problem Relation Age of Onset  . Prostate cancer Father   .  Depression Father   . Hypertension Mother   . Kidney Stones Mother   . Anxiety disorder Mother   .  Depression Mother   . COPD Sister   . Hypertension Sister   . Diabetes Sister   . Depression Sister   . Anxiety disorder Sister   . Seizures Sister     Social History Social History  Substance Use Topics  . Smoking status: Current Every Day Smoker -- 1.50 packs/day for 17 years    Types: Cigarettes  . Smokeless tobacco: Never Used  . Alcohol Use: 7.2 oz/week    0 Glasses of wine, 12 Cans of beer, 0 Shots of liquor per week     Comment: beer- 1-2 x's per month     Review of Systems Constitutional: No fever/chills Eyes: No visual changes. ENT: No sore throat. Cardiovascular:chest pain. Respiratory: Denies shortness of breath. Gastrointestinal:  abdominal pain, nausea and vomiting.  No diarrhea.  No constipation. Genitourinary: Negative for dysuria. Musculoskeletal: Negative for back pain. Skin: Negative for rash. Neurological: Negative for headaches, focal weakness or numbness. Psych: Anxiety and racing thoughts  10-point ROS otherwise negative.  ____________________________________________   PHYSICAL EXAM:  VITAL SIGNS: ED Triage Vitals  Enc Vitals Group     BP 08/11/15 0448 145/104 mmHg     Pulse Rate 08/11/15 0448 74     Resp 08/11/15 0448 14     Temp 08/11/15 0448 97.3 F (36.3 C)     Temp Source 08/11/15 0448 Oral     SpO2 08/11/15 0448 100 %     Weight 08/11/15 0448 204 lb (92.534 kg)     Height 08/11/15 0448 6\' 2"  (1.88 m)     Head Cir --      Peak Flow --      Pain Score 08/11/15 0448 8     Pain Loc --      Pain Edu? --      Excl. in [place]? --     Constitutional: Alert and oriented. Well appearing and in moderate distress. Eyes: Conjunctivae are normal. PERRL. EOMI. Head: Atraumatic. Nose: No congestion/rhinnorhea. Mouth/Throat: Mucous membranes are moist.  Oropharynx non-erythematous. Cardiovascular: Normal rate, regular rhythm. Grossly normal  heart sounds.  Good peripheral circulation. Respiratory: Normal respiratory effort.  No retractions. Lungs CTAB. Gastrointestinal: Soft with right upper quadrant tenderness to palpation. No distention.  Musculoskeletal: No lower extremity tenderness nor edema.  Neurologic:  Normal speech and language.  Skin:  Skin is warm, dry and intact. [place] [facility] Psychiatric: Mood and affect are normal.   ____________________________________________   LABS (all labs ordered are listed, but only abnormal results are displayed)  Labs Reviewed  BASIC METABOLIC PANEL - Abnormal; Notable for the following:    Potassium 3.4 (*)    Glucose, Bld 102 (*)    All other components within normal limits  HEPATIC FUNCTION PANEL - Abnormal; Notable for the following:    Bilirubin, Direct <0.1 (*)    All other components within normal limits  CBC  TROPONIN I  LIPASE, BLOOD  TROPONIN I   ____________________________________________  EKG  ED ECG REPORT I, Loney Hering, the attending physician, personally viewed and interpreted this ECG.   Date: 08/11/2015  EKG Time: 451  Rate: 78  Rhythm: normal sinus rhythm  Axis: normal  Intervals:none  ST&T Change: flipped t wave leads I, avl  ____________________________________________  RADIOLOGY  CXR: No acute cardiopulmonary disease  [place] abd: Normal limited right upper quadrant abdominal ultrasound ____________________________________________   PROCEDURES  Procedure(s) performed: None  Critical Care performed: No  ____________________________________________   INITIAL IMPRESSION / ASSESSMENT AND PLAN / ED COURSE  Pertinent labs & imaging results that were available during my care of the patient were reviewed by me and considered in my medical decision making (see chart for details).  Is a 44 year old male who comes in with right upper quadrant pain and abdominal pain. The patient's also been having some anxiety and panic attacks. We will evaluate  the patient and disposition him home as BP. Also due to his panic attacks and anxiety I will have him evaluated by TTS. Denies suicidal ideation.  The patient's care was signed out to Dr. Joni Fears who will follow up the recommendations by psychiatry. He will also follow up the patient's urinalysis. ____________________________________________   FINAL CLINICAL IMPRESSION(S) / ED DIAGNOSES  Final diagnoses:  Abdominal pain  Anxiety  Chest pain, unspecified chest pain type      NEW MEDICATIONS STARTED DURING THIS VISIT:  New Prescriptions   No medications on file     Note:  This document was prepared using Dragon voice recognition software and may include unintentional dictation errors.    Loney Hering, MD 08/11/15 [id number]

## 2015-08-11 NOTE — Consult Note (Signed)
[facility] Face-to-Face Psychiatry Consult   Reason for Consult:  Consult for this 45 year old man came in to the hospital complaining primarily of medical problems specifically abdominal pain. Referral for psychiatric evaluation for anxiety and depression Referring Physician:  Dahlia Client Patient Identification: COWAN PILAR MRN:  [id number] Principal Diagnosis: Major depressive disorder, recurrent episode, moderate with anxious distress [facility]) Diagnosis:   Patient Active Problem List   Diagnosis Date Noted  . Generalized anxiety disorder [F41.1] 08/11/2015  . Major depressive disorder, recurrent episode, moderate with anxious distress ([place]) [F33.1] 06/21/2015  . NSTEMI (non-ST elevated myocardial infarction) ([place]) [I21.4] 06/20/2015  . STEMI (ST elevation myocardial infarction) ([place]) [I21.3] 01/01/2015  . Acute MI, inferoposterior wall ([place]) [I21.19] 01/01/2015  . Common bile duct dilation [K83.8] 10/06/2014  . Right lower quadrant abdominal pain [R10.31] 10/06/2014  . Loss of weight [R63.4] 10/06/2014  . Cervicogenic headache [R51] 07/24/2014  . Cervical spondylosis without myelopathy [M47.812] 07/24/2014  . Clinical depression [F32.9] 04/15/2013  . Acid reflux [K21.9] 04/15/2013  . BP (high blood pressure) [I10] 04/15/2013  . Encounter for other preprocedural examination [L97.673] 04/15/2013  . Nasal obstruction [J34.89] 02/12/2013  . Inflamed nasal mucosa [J31.0] 02/12/2013  . Sinus infection [J32.9] 02/12/2013  . Gastric catarrh [K29.70] 03/01/2011  . Allergic rhinitis [J30.9] 10/20/2010  . Airway hyperreactivity [J45.909] 10/20/2010  . Anxiety, generalized [F41.1] 10/20/2010  . Cervical pain [M54.2] 10/20/2010  . Current tobacco use [Z72.0] 10/20/2010    Total Time spent with patient: 1 hour  Subjective:   Michael Schmitt is a 45 y.o. male patient admitted with "I've just been under a lot of stress".  HPI:  Patient interviewed. Patient known to me also from recent and past  encounters. Chart reviewed. Labs and vitals reviewed. Patient reported to the emergency room today primarily complaining about physical symptoms. It became evident to the emergency room doctor that his most acute problem was anxiety and he was referred for a psychiatric consult. Patient tells me that his mood stays depressed all the time and he feels like he is under tremendous stress. The symptoms of been present for years but he says they've been a little worse over the last couple weeks. Most immediate new stress was that 2 weeks ago he went out and had a couple beers for what he says was the first time in years. Needless to say he immediately got a DWI. Now he has no driver's license and is even more worried about money. Has a lot of somatic symptoms of anxiety. Stomach stays upset all the time. Feels achy. He says his thoughts are racing all the time. He doesn't sleep. Well and he doesn't eat very well. Patient denies that he has continued to use alcohol and denies other drug abuse. He was a little evasive about it at first but it eventually came out that he is really not been compliant with his psychiatric treatment. Ever since his Medicaid expired recently he has not gone back to see Dr. Kasandra Knudsen and so he has not gotten refills on his medications.  Social history: Patient lives independently. He has 2 children ages 46 and 16 who live most of the time with him although he shares custody I believe with his ex-wife. He is not working outside the home. He used to get Medicaid along with his disability but no longer gets it evidently because his income was too high. Now he has no insurance.  Substance abuse history: Patient has a history of alcohol abuse and possibly abuse  of prescription medicines in the past. He claims that he has not been drinking and not been abusing any drugs in years except for this one day that he got a DWI 2 weeks ago.  Medical history: Multiple somatic complaints without often a really  clear cause. He has chronic abdominal pain. It's been worked up and there is no evidence of clear abdominal pathology. He likes to believe that he has ulcers but he is taking H2 blockers and Carafate regularly. He has high blood pressure. Possible past history of an MI  Past Psychiatric History: Patient has had long-standing depression. He's had hospitalizations in the past. He has had times in the past of expressing suicidal ideation. Has not made serious suicide attempts. No homicidal ideation no history of violence. He's been on multiple medications. He says that Lexapro was at one time helpful but eventually seemed to stop working. More recently he was taking mirtazapine and Klonopin. He says he still on the Klonopin but has run out of the mirtazapine.  Risk to Self: Is patient at risk for suicide?: No Risk to Others:   Prior Inpatient Therapy:   Prior Outpatient Therapy:    Past Medical History:  Past Medical History  Diagnosis Date  . Hypertension   . Mental disorder   . Anxiety   . Shortness of breath   . RLS (restless legs syndrome)     detected on sleep study  . GERD (gastroesophageal reflux disease)   . Headache(784.0)   . Blood dyscrasia     told that when he was young he was a" free bleeder"  . Arthritis     cerv. stenosis, spondylosis, HNP- lower back , has been followed in pain clinic, has  had injection s in cerv. area  . Chronic kidney disease     renal calculi- passed spontaneously  . Anxiety   . Depression   . Diabetes mellitus without complication ([place])   . Cervicogenic headache 07/24/2014  . Cervical spondylosis without myelopathy 07/24/2014  . Fatty liver   . CAD (coronary artery disease)   . MI, old     Past Surgical History  Procedure Laterality Date  . Nasal sinus surgery      2005  . Anterior cervical decomp/discectomy fusion  11/13/2011    Procedure: ANTERIOR CERVICAL DECOMPRESSION/DISCECTOMY FUSION 1 LEVEL;  Surgeon: [name], MD;  Location: [place] [facility];  Service: Neurosurgery;  Laterality: N/A;  Anterior Cervical Decompression/discectomy Fusion. Cervical three-four.  . Cardiac catheterization N/A 01/01/2015    Procedure: Left Heart Cath and Coronary Angiography;  Surgeon: [name], MD;  Location: [place] [facility];  Service: Cardiovascular;  Laterality: N/A;   Family History:  Family History  Problem Relation Age of Onset  . Prostate cancer Father   . Depression Father   . Hypertension Mother   . Kidney Stones Mother   . Anxiety disorder Mother   . Depression Mother   . COPD Sister   . Hypertension Sister   . Diabetes Sister   . Depression Sister   . Anxiety disorder Sister   . Seizures Sister    Family Psychiatric  History: Patient denies knowing of any family history of mental health problems Social History:  History  Alcohol Use  . 7.2 oz/week  . 0 Glasses of wine, 12 Cans of beer, 0 Shots of liquor per week    Comment: beer- 1-2 x's per month      History  Drug Use No  Social History   Social History  . Marital Status: Single    Spouse Name: N/A  . Number of Children: 3  . Years of Education: 12   Occupational History  .      unemployed   Social History Main Topics  . Smoking status: Current Every Day Smoker -- 1.50 packs/day for 17 years    Types: Cigarettes  . Smokeless tobacco: Never Used  . Alcohol Use: 7.2 oz/week    0 Glasses of wine, 12 Cans of beer, 0 Shots of liquor per week     Comment: beer- 1-2 x's per month   . Drug Use: No  . Sexual Activity: Not Currently   Other Topics Concern  . None   Social History Narrative   Patient is right handed.   Patient drinks 2 sodas daily.   Additional Social History:    Allergies:   Allergies  Allergen Reactions  . Prednisone Other (See Comments)    Hypertension, makes him feel spacey  . Hydrocodone Other (See Comments)    Headache, irritable  . Varenicline Other (See Comments)    Suicidal thoughts    Labs:  Results for orders  placed or performed during the hospital encounter of 08/11/15 (from the past 48 hour(s))  Basic metabolic panel     Status: Abnormal   Collection Time: 08/11/15  4:55 AM  Result Value Ref Range   Sodium 140 135 - 145 mmol/L   Potassium 3.4 (L) 3.5 - 5.1 mmol/L   Chloride 104 101 - 111 mmol/L   CO2 26 22 - 32 mmol/L   Glucose, Bld 102 (H) 65 - 99 mg/dL   BUN 11 6 - 20 mg/dL   Creatinine, Ser 1.03 0.61 - 1.24 mg/dL   Calcium 9.8 8.9 - 10.3 mg/dL   GFR calc non Af Amer >60 >60 mL/min   GFR calc Af Amer >60 >60 mL/min    Comment: (NOTE) The eGFR has been calculated using the CKD EPI equation. This calculation has not been validated in all clinical situations. eGFR's persistently <60 mL/min signify possible Chronic Kidney Disease.    Anion gap 10 5 - 15  CBC     Status: None   Collection Time: 08/11/15  4:55 AM  Result Value Ref Range   WBC 10.5 3.8 - 10.6 K/uL   RBC 5.02 4.40 - 5.90 MIL/uL   Hemoglobin 15.5 13.0 - 18.0 g/dL   HCT 44.7 40.0 - 52.0 %   MCV 89.0 80.0 - 100.0 fL   MCH 30.8 26.0 - 34.0 pg   MCHC 34.6 32.0 - 36.0 g/dL   RDW 13.9 11.5 - 14.5 %   Platelets 234 150 - 440 K/uL  Troponin I     Status: None   Collection Time: 08/11/15  4:55 AM  Result Value Ref Range   Troponin I <0.03 <0.031 ng/mL    Comment:        NO INDICATION OF MYOCARDIAL INJURY.   Lipase, blood     Status: None   Collection Time: 08/11/15  4:55 AM  Result Value Ref Range   Lipase 28 11 - 51 U/L  Hepatic function panel     Status: Abnormal   Collection Time: 08/11/15  4:55 AM  Result Value Ref Range   Total Protein 7.6 6.5 - 8.1 g/dL   Albumin 4.6 3.5 - 5.0 g/dL   AST 28 15 - 41 U/L   ALT 26 17 - 63 U/L   Alkaline Phosphatase 101  38 - 126 U/L   Total Bilirubin 0.7 0.3 - 1.2 mg/dL   Bilirubin, Direct <0.1 (L) 0.1 - 0.5 mg/dL   Indirect Bilirubin NOT CALCULATED 0.3 - 0.9 mg/dL  Troponin I     Status: None   Collection Time: 08/11/15  8:35 AM  Result Value Ref Range   Troponin I <0.03  <0.031 ng/mL    Comment:        NO INDICATION OF MYOCARDIAL INJURY.     No current facility-administered medications for this encounter.   Current Outpatient Prescriptions  Medication Sig Dispense Refill  . aspirin 81 MG chewable tablet Chew 1 tablet (81 mg total) by mouth daily. 30 tablet 3  . atorvastatin (LIPITOR) 80 MG tablet Take 1 tablet (80 mg total) by mouth daily at 6 PM. 30 tablet 3  . cetirizine (ZYRTEC) 10 MG tablet     . clindamycin (CLEOCIN) 300 MG capsule Take 1 capsule (300 mg total) by mouth 3 (three) times daily. 30 capsule 0  . clonazePAM (KLONOPIN) 1 MG tablet Take 1 mg by mouth 3 (three) times daily.     . clopidogrel (PLAVIX) 75 MG tablet     . dicyclomine (BENTYL) 20 MG tablet Take 1 tablet (20 mg total) by mouth 3 (three) times daily as needed (abdominal pain). 30 tablet 0  . escitalopram (LEXAPRO) 10 MG tablet Take 1 tablet (10 mg total) by mouth daily. 30 tablet 0  . glimepiride (AMARYL) 1 MG tablet TAKE 1 TABLET(S) BY MOUTH DAILY WITH LARGEST MEAL OF THE DAY  3  . lisinopril (PRINIVIL,ZESTRIL) 5 MG tablet Take 1 tablet (5 mg total) by mouth daily. 30 tablet 0  . metoprolol tartrate (LOPRESSOR) 25 MG tablet Take 0.5 tablets (12.5 mg total) by mouth 2 (two) times daily. 30 tablet 3  . mirtazapine (REMERON) 45 MG tablet Take 1 tablet (45 mg total) by mouth at bedtime. 30 tablet 0  . naproxen (NAPROSYN) 500 MG tablet Take 1 tablet (500 mg total) by mouth 2 (two) times daily with a meal. 14 tablet 0  . nitroGLYCERIN (NITROSTAT) 0.4 MG SL tablet Place 1 tablet (0.4 mg total) under the tongue every 5 (five) minutes x 3 doses as needed for chest pain. 25 tablet 12  . prasugrel (EFFIENT) 10 MG TABS tablet Take 1 tablet (10 mg total) by mouth daily. 30 tablet 11  . ranitidine (ZANTAC) 150 MG tablet Take 1 tablet (150 mg total) by mouth at bedtime. 30 tablet 1  . sucralfate (CARAFATE) 1 g tablet Take 1 tablet (1 g total) by mouth 4 (four) times daily. 60 tablet 0     Musculoskeletal: Strength & Muscle Tone: within normal limits Gait & Station: normal Patient leans: N/A  Psychiatric Specialty Exam: Review of Systems  Constitutional: Negative.   HENT: Negative.   Eyes: Negative.   Respiratory: Negative.   Cardiovascular: Negative.   Gastrointestinal: Positive for nausea and abdominal pain.  Musculoskeletal: Negative.   Skin: Negative.   Neurological: Negative.   Psychiatric/Behavioral: Positive for depression. Negative for suicidal ideas, hallucinations, memory loss and substance abuse. The patient is nervous/anxious and has insomnia.     Blood pressure 122/75, pulse 82, temperature 98 F (36.7 C), temperature source Oral, resp. rate 20, height '6\' 2"'  (1.88 m), weight 92.534 kg (204 lb), SpO2 99 %.Body mass index is 26.18 kg/(m^2).  General Appearance: Casual  Eye Contact::  Fair  Speech:  Clear and Coherent  Volume:  Normal  Mood:  Anxious and Dysphoric  Affect:  Constricted  Thought Process:  Goal Directed  Orientation:  Full (Time, Place, and Person)  Thought Content:  Negative  Suicidal Thoughts:  No  Homicidal Thoughts:  No  Memory:  Immediate;   Good Recent;   Fair Remote;   Fair  Judgement:  Fair  Insight:  Fair  Psychomotor Activity:  Decreased  Concentration:  Fair  Recall:  [employer] of Knowledge:Fair  Language: Fair  Akathisia:  No  Handed:  Right  AIMS (if indicated):     Assets:  Desire for Improvement Financial Resources/Insurance Housing Resilience Social Support  ADL's:  Intact  Cognition: WNL  Sleep:      Treatment Plan Summary: Medication management and Plan Patient is not currently suicidal or homicidal, not psychotic, does not require inpatient psychiatric treatment. Patient has been strongly advised to get back into seeing a psychiatrist as soon as possible. It sounds like Dr. Kasandra Knudsen would see him for only $75 a visit which seems more than reasonable to me but if that is still too much for him we will  suggest he go to [facility] for an intake. I have agreed to give her a prescription for the Remeron 45 mg at night and also Lexapro 10 mg a day for his anxiety and depression but I have declined to give him a new prescription for Klonopin which he also asked for. Patient strongly encouraged to continue staying off of alcohol. He can be released from the emergency room. Case discussed with emergency room physician.  Disposition: Patient does not meet criteria for psychiatric inpatient admission. Supportive therapy provided about ongoing stressors.  Alethia Berthold, MD 08/11/2015 2:02 PM

## 2015-08-11 NOTE — ED Notes (Signed)
Pt to rm 2 via EMS from home.  Pt reports CP x 2 weeks and lower abd pain x 1 week.  Pt reports nausea, no vomiting, reports loose stools but not diarrhea.  Pt reports anxiety and depression as well, denies SI.  Pt NAD at this time, respirations equal and unlabored, skin warm and dry.

## 2015-08-11 NOTE — ED Notes (Signed)
Pt resting in bed, eyes closed, xray at bedside for transfer

## 2015-08-11 NOTE — Discharge Instructions (Signed)
Abdominal Pain, Adult Many things can cause abdominal pain. Usually, abdominal pain is not caused by a disease and will improve without treatment. It can often be observed and treated at home. Your health care provider will do a physical exam and possibly order blood tests and X-rays to help determine the seriousness of your pain. However, in many cases, more time must pass before a clear cause of the pain can be found. Before that point, your health care provider may not know if you need more testing or further treatment. HOME CARE INSTRUCTIONS Monitor your abdominal pain for any changes. The following actions may help to alleviate any discomfort you are experiencing:  Only take over-the-counter or prescription medicines as directed by your health care provider.  Do not take laxatives unless directed to do so by your health care provider.  Try a clear liquid diet (broth, tea, or water) as directed by your health care provider. Slowly move to a bland diet as tolerated. SEEK MEDICAL CARE IF:  You have unexplained abdominal pain.  You have abdominal pain associated with nausea or diarrhea.  You have pain when you urinate or have a bowel movement.  You experience abdominal pain that wakes you in the night.  You have abdominal pain that is worsened or improved by eating food.  You have abdominal pain that is worsened with eating fatty foods.  You have a fever. SEEK IMMEDIATE MEDICAL CARE IF:  Your pain does not go away within 2 hours.  You keep throwing up (vomiting).  Your pain is felt only in portions of the abdomen, such as the right side or the left lower portion of the abdomen.  You pass bloody or black tarry stools. MAKE SURE YOU:  Understand these instructions.  Will watch your condition.  Will get help right away if you are not doing well or get worse.   This information is not intended to replace advice given to you by your health care provider. Make sure you discuss  any questions you have with your health care provider.   Document Released: 12/21/2004 Document Revised: 12/02/2014 Document Reviewed: 11/20/2012 Elsevier Interactive Patient Education 2016 Elsevier Inc.  Nonspecific Chest Pain  Chest pain can be caused by many different conditions. There is always a chance that your pain could be related to something serious, such as a heart attack or a blood clot in your lungs. Chest pain can also be caused by conditions that are not life-threatening. If you have chest pain, it is very important to follow up with your health care provider. CAUSES  Chest pain can be caused by:  Heartburn.  Pneumonia or bronchitis.  Anxiety or stress.  Inflammation around your heart (pericarditis) or lung (pleuritis or pleurisy).  A blood clot in your lung.  A collapsed lung (pneumothorax). It can develop suddenly on its own (spontaneous pneumothorax) or from trauma to the chest.  Shingles infection (varicella-zoster virus).  Heart attack.  Damage to the bones, muscles, and cartilage that make up your chest wall. This can include:  Bruised bones due to injury.  Strained muscles or cartilage due to frequent or repeated coughing or overwork.  Fracture to one or more ribs.  Sore cartilage due to inflammation (costochondritis). RISK FACTORS  Risk factors for chest pain may include:  Activities that increase your risk for trauma or injury to your chest.  Respiratory infections or conditions that cause frequent coughing.  Medical conditions or overeating that can cause heartburn.  Heart disease or family  history of heart disease.  Conditions or health behaviors that increase your risk of developing a blood clot.  Having had chicken pox (varicella zoster). SIGNS AND SYMPTOMS Chest pain can feel like:  Burning or tingling on the surface of your chest or deep in your chest.  Crushing, pressure, aching, or squeezing pain.  Dull or sharp pain that is  worse when you move, cough, or take a deep breath.  Pain that is also felt in your back, neck, shoulder, or arm, or pain that spreads to any of these areas. Your chest pain may come and go, or it may stay constant. DIAGNOSIS Lab tests or other studies may be needed to find the cause of your pain. Your health care provider may have you take a test called an ambulatory ECG (electrocardiogram). An ECG records your heartbeat patterns at the time the test is performed. You may also have other tests, such as:  Transthoracic echocardiogram (TTE). During echocardiography, sound waves are used to create a picture of all of the heart structures and to look at how blood flows through your heart.  Transesophageal echocardiogram (TEE).This is a more advanced imaging test that obtains images from inside your body. It allows your health care provider to see your heart in finer detail.  Cardiac monitoring. This allows your health care provider to monitor your heart rate and rhythm in real time.  Holter monitor. This is a portable device that records your heartbeat and can help to diagnose abnormal heartbeats. It allows your health care provider to track your heart activity for several days, if needed.  Stress tests. These can be done through exercise or by taking medicine that makes your heart beat more quickly.  Blood tests.  Imaging tests. TREATMENT  Your treatment depends on what is causing your chest pain. Treatment may include:  Medicines. These may include:  Acid blockers for heartburn.  Anti-inflammatory medicine.  Pain medicine for inflammatory conditions.  Antibiotic medicine, if an infection is present.  Medicines to dissolve blood clots.  Medicines to treat coronary artery disease.  Supportive care for conditions that do not require medicines. This may include:  Resting.  Applying heat or cold packs to injured areas.  Limiting activities until pain decreases. HOME CARE  INSTRUCTIONS  If you were prescribed an antibiotic medicine, finish it all even if you start to feel better.  Avoid any activities that bring on chest pain.  Do not use any tobacco products, including cigarettes, chewing tobacco, or electronic cigarettes. If you need help quitting, ask your health care provider.  Do not drink alcohol.  Take medicines only as directed by your health care provider.  Keep all follow-up visits as directed by your health care provider. This is important. This includes any further testing if your chest pain does not go away.  If heartburn is the cause for your chest pain, you may be told to keep your head raised (elevated) while sleeping. This reduces the chance that acid will go from your stomach into your esophagus.  Make lifestyle changes as directed by your health care provider. These may include:  Getting regular exercise. Ask your health care provider to suggest some activities that are safe for you.  Eating a heart-healthy diet. A registered dietitian can help you to learn healthy eating options.  Maintaining a healthy weight.  Managing diabetes, if necessary.  Reducing stress. SEEK MEDICAL CARE IF:  Your chest pain does not go away after treatment.  You have a rash with  blisters on your chest.  You have a fever. SEEK IMMEDIATE MEDICAL CARE IF:   Your chest pain is worse.  You have an increasing cough, or you cough up blood.  You have severe abdominal pain.  You have severe weakness.  You faint.  You have chills.  You have sudden, unexplained chest discomfort.  You have sudden, unexplained discomfort in your arms, back, neck, or jaw.  You have shortness of breath at any time.  You suddenly start to sweat, or your skin gets clammy.  You feel nauseous or you vomit.  You suddenly feel light-headed or dizzy.  Your heart begins to beat quickly, or it feels like it is skipping beats. These symptoms may represent a serious  problem that is an emergency. Do not wait to see if the symptoms will go away. Get medical help right away. Call your local emergency services (911 in the [place] Do not drive yourself to the hospital.   This information is not intended to replace advice given to you by your health care provider. Make sure you discuss any questions you have with your health care provider.   Document Released: 12/21/2004 Document Revised: 04/03/2014 Document Reviewed: 10/17/2013 Elsevier Interactive Patient Education 2016 Elsevier Inc.  Generalized Anxiety Disorder Generalized anxiety disorder (GAD) is a mental disorder. It interferes with life functions, including relationships, work, and school. GAD is different from normal anxiety, which everyone experiences at some point in their lives in response to specific life events and activities. Normal anxiety actually helps [place] prepare for and get through these life events and activities. Normal anxiety goes away after the event or activity is over.  GAD causes anxiety that is not necessarily related to specific events or activities. It also causes excess anxiety in proportion to specific events or activities. The anxiety associated with GAD is also difficult to control. GAD can vary from mild to severe. People with severe GAD can have intense waves of anxiety with physical symptoms (panic attacks).  SYMPTOMS The anxiety and worry associated with GAD are difficult to control. This anxiety and worry are related to many life events and activities and also occur more days than not for 6 months or longer. People with GAD also have three or more of the following symptoms (one or more in children):  Restlessness.   Fatigue.  Difficulty concentrating.   Irritability.  Muscle tension.  Difficulty sleeping or unsatisfying sleep. DIAGNOSIS GAD is diagnosed through an assessment by your health care provider. Your health care provider will ask you questions aboutyour  mood,physical symptoms, and events in your life. Your health care provider may ask you about your medical history and use of alcohol or drugs, including prescription medicines. Your health care provider may also do a physical exam and blood tests. Certain medical conditions and the use of certain substances can cause symptoms similar to those associated with GAD. Your health care provider may refer you to a mental health specialist for further evaluation. TREATMENT The following therapies are usually used to treat GAD:   Medication. Antidepressant medication usually is prescribed for long-term daily control. Antianxiety medicines may be added in severe cases, especially when panic attacks occur.   Talk therapy (psychotherapy). Certain types of talk therapy can be helpful in treating GAD by providing support, education, and guidance. A form of talk therapy called cognitive behavioral therapy can teach you healthy ways to think about and react to daily life events and activities.  Stress managementtechniques. These include yoga, meditation, and  exercise and can be very helpful when they are practiced regularly. A mental health specialist can help determine which treatment is best for you. Some people see improvement with one therapy. However, other people require a combination of therapies.   This information is not intended to replace advice given to you by your health care provider. Make sure you discuss any questions you have with your health care provider.   Document Released: 07/08/2012 Document Revised: 04/03/2014 Document Reviewed: 07/08/2012 Elsevier Interactive Patient Education [employer].

## 2015-08-11 NOTE — ED Notes (Signed)
Pt returned to room, resting in bed in no acute distress

## 2015-08-11 NOTE — Consult Note (Signed)
  Psychiatry: Preliminary note. Full note to follow. Patient not currently suicidal or dangerous and does not require inpatient treatment. Prescriptions written. Reviewed case with the ER doctor. Patient can be released from the ER.

## 2015-08-13 ENCOUNTER — Telehealth: Payer: Self-pay | Admitting: *Deleted

## 2015-08-13 NOTE — Telephone Encounter (Signed)
sw pt made him aware that his appt for 10/01/15 has been cancelled due to [facility] no longer accepting new pts. pt requested for me to try Dr. Lowella Dandy...td

## 2015-08-20 ENCOUNTER — Emergency Department
Admission: EM | Admit: 2015-08-20 | Discharge: 2015-08-20 | Disposition: A | Discharge status: Home / Self Care | Payer: Medicaid Other | Attending: Emergency Medicine | Admitting: Emergency Medicine

## 2015-08-20 ENCOUNTER — Encounter: Payer: Self-pay | Admitting: Emergency Medicine

## 2015-08-20 DIAGNOSIS — I129 Hypertensive chronic kidney disease with stage 1 through stage 4 chronic kidney disease, or unspecified chronic kidney disease: Secondary | ICD-10-CM | POA: Diagnosis not present

## 2015-08-20 DIAGNOSIS — Z72820 Sleep deprivation: Secondary | ICD-10-CM | POA: Diagnosis not present

## 2015-08-20 DIAGNOSIS — G8929 Other chronic pain: Secondary | ICD-10-CM | POA: Diagnosis not present

## 2015-08-20 DIAGNOSIS — M542 Cervicalgia: Secondary | ICD-10-CM | POA: Diagnosis present

## 2015-08-20 DIAGNOSIS — F331 Major depressive disorder, recurrent, moderate: Secondary | ICD-10-CM | POA: Diagnosis not present

## 2015-08-20 DIAGNOSIS — N189 Chronic kidney disease, unspecified: Secondary | ICD-10-CM | POA: Diagnosis not present

## 2015-08-20 DIAGNOSIS — F17210 Nicotine dependence, cigarettes, uncomplicated: Secondary | ICD-10-CM | POA: Diagnosis not present

## 2015-08-20 DIAGNOSIS — R109 Unspecified abdominal pain: Secondary | ICD-10-CM | POA: Insufficient documentation

## 2015-08-20 DIAGNOSIS — M1990 Unspecified osteoarthritis, unspecified site: Secondary | ICD-10-CM | POA: Diagnosis not present

## 2015-08-20 DIAGNOSIS — I2510 Atherosclerotic heart disease of native coronary artery without angina pectoris: Secondary | ICD-10-CM | POA: Insufficient documentation

## 2015-08-20 DIAGNOSIS — I252 Old myocardial infarction: Secondary | ICD-10-CM | POA: Diagnosis not present

## 2015-08-20 DIAGNOSIS — R51 Headache: Secondary | ICD-10-CM | POA: Insufficient documentation

## 2015-08-20 DIAGNOSIS — E1122 Type 2 diabetes mellitus with diabetic chronic kidney disease: Secondary | ICD-10-CM | POA: Diagnosis not present

## 2015-08-20 DIAGNOSIS — Z79899 Other long term (current) drug therapy: Secondary | ICD-10-CM | POA: Insufficient documentation

## 2015-08-20 DIAGNOSIS — R519 Headache, unspecified: Secondary | ICD-10-CM

## 2015-08-20 DIAGNOSIS — Z7982 Long term (current) use of aspirin: Secondary | ICD-10-CM | POA: Insufficient documentation

## 2015-08-20 NOTE — ED Provider Notes (Signed)
[facility] Emergency Department Provider Note   ____________________________________________  Time seen: ~0835  I have reviewed the triage vital signs and the nursing notes.   HISTORY  Chief Complaint Insomnia and Neck Pain   History limited by: Not Limited   HPI Michael Schmitt is a 45 y.o. male who presents to the emergency department today with primary concerns for difficulty with sleeping. Patient has secondary concerns of chronic neck pain, abdominal pain. This is now the patient's fifth visit to this emergency Department this month. His is the third time I have personally evaluated this patient. Patient states that for the past 3 days he has been unable to sleep. He will sleep for maybe 2 hours and then wake up and not go back to sleep. He states he continues to have his chronic neck pain. He continues to have his abdominal plain and is continuing to take Excedrin. He denies any SI or HI.   Past Medical History  Diagnosis Date  . Hypertension   . Mental disorder   . Anxiety   . Shortness of breath   . RLS (restless legs syndrome)     detected on sleep study  . GERD (gastroesophageal reflux disease)   . Headache(784.0)   . Blood dyscrasia     told that when he was young he was a" free bleeder"  . Arthritis     cerv. stenosis, spondylosis, HNP- lower back , has been followed in pain clinic, has  had injection s in cerv. area  . Chronic kidney disease     renal calculi- passed spontaneously  . Anxiety   . Depression   . Diabetes mellitus without complication ([place])   . Cervicogenic headache 07/24/2014  . Cervical spondylosis without myelopathy 07/24/2014  . Fatty liver   . CAD (coronary artery disease)   . MI, old     Patient Active Problem List   Diagnosis Date Noted  . Generalized anxiety disorder 08/11/2015  . Major depressive disorder, recurrent episode, moderate with anxious distress ([place]) 06/21/2015  . NSTEMI (non-ST elevated  myocardial infarction) ([place]) 06/20/2015  . STEMI (ST elevation myocardial infarction) ([place]) 01/01/2015  . Acute MI, inferoposterior wall ([place]) 01/01/2015  . Common bile duct dilation 10/06/2014  . Right lower quadrant abdominal pain 10/06/2014  . Loss of weight 10/06/2014  . Cervicogenic headache 07/24/2014  . Cervical spondylosis without myelopathy 07/24/2014  . Clinical depression 04/15/2013  . Acid reflux 04/15/2013  . BP (high blood pressure) 04/15/2013  . Encounter for other preprocedural examination 04/15/2013  . Nasal obstruction 02/12/2013  . Inflamed nasal mucosa 02/12/2013  . Sinus infection 02/12/2013  . Gastric catarrh 03/01/2011  . Allergic rhinitis 10/20/2010  . Airway hyperreactivity 10/20/2010  . Anxiety, generalized 10/20/2010  . Cervical pain 10/20/2010  . Current tobacco use 10/20/2010    Past Surgical History  Procedure Laterality Date  . Nasal sinus surgery      2005  . Anterior cervical decomp/discectomy fusion  11/13/2011    Procedure: ANTERIOR CERVICAL DECOMPRESSION/DISCECTOMY FUSION 1 LEVEL;  Surgeon: [name], MD;  Location: [place] [facility];  Service: Neurosurgery;  Laterality: N/A;  Anterior Cervical Decompression/discectomy Fusion. Cervical three-four.  . Cardiac catheterization N/A 01/01/2015    Procedure: Left Heart Cath and Coronary Angiography;  Surgeon: [name], MD;  Location: [place] [facility];  Service: Cardiovascular;  Laterality: N/A;    Current Outpatient Rx  Name  Route  Sig  Dispense  Refill  . aspirin 81 MG  chewable tablet   Oral   Chew 1 tablet (81 mg total) by mouth daily.   30 tablet   3   . atorvastatin (LIPITOR) 80 MG tablet   Oral   Take 1 tablet (80 mg total) by mouth daily at 6 PM.   30 tablet   3   . cetirizine (ZYRTEC) 10 MG tablet               . clindamycin (CLEOCIN) 300 MG capsule   Oral   Take 1 capsule (300 mg total) by mouth 3 (three) times daily.   30 capsule   0   . clonazePAM (KLONOPIN) 1 MG  tablet   Oral   Take 1 mg by mouth 3 (three) times daily.          . clopidogrel (PLAVIX) 75 MG tablet               . dicyclomine (BENTYL) 20 MG tablet   Oral   Take 1 tablet (20 mg total) by mouth 3 (three) times daily as needed (abdominal pain).   30 tablet   0   . escitalopram (LEXAPRO) 10 MG tablet   Oral   Take 1 tablet (10 mg total) by mouth daily.   30 tablet   0   . glimepiride (AMARYL) 1 MG tablet      TAKE 1 TABLET(S) BY MOUTH DAILY WITH LARGEST MEAL OF THE DAY      3   . lisinopril (PRINIVIL,ZESTRIL) 5 MG tablet   Oral   Take 1 tablet (5 mg total) by mouth daily.   30 tablet   0   . metoprolol tartrate (LOPRESSOR) 25 MG tablet   Oral   Take 0.5 tablets (12.5 mg total) by mouth 2 (two) times daily.   30 tablet   3   . mirtazapine (REMERON) 45 MG tablet   Oral   Take 1 tablet (45 mg total) by mouth at bedtime.   30 tablet   0   . naproxen (NAPROSYN) 500 MG tablet   Oral   Take 1 tablet (500 mg total) by mouth 2 (two) times daily with a meal.   14 tablet   0   . nitroGLYCERIN (NITROSTAT) 0.4 MG SL tablet   Sublingual   Place 1 tablet (0.4 mg total) under the tongue every 5 (five) minutes x 3 doses as needed for chest pain.   25 tablet   12   . prasugrel (EFFIENT) 10 MG TABS tablet   Oral   Take 1 tablet (10 mg total) by mouth daily.   30 tablet   11   . ranitidine (ZANTAC) 150 MG tablet   Oral   Take 1 tablet (150 mg total) by mouth at bedtime.   30 tablet   1   . sucralfate (CARAFATE) 1 g tablet   Oral   Take 1 tablet (1 g total) by mouth 4 (four) times daily.   60 tablet   0     Allergies Prednisone; Hydrocodone; and Varenicline  Family History  Problem Relation Age of Onset  . Prostate cancer Father   . Depression Father   . Hypertension Mother   . Kidney Stones Mother   . Anxiety disorder Mother   . Depression Mother   . COPD Sister   . Hypertension Sister   . Diabetes Sister   . Depression Sister   . Anxiety  disorder Sister   . Seizures Sister  Social History Social History  Substance Use Topics  . Smoking status: Current Every Day Smoker -- 1.50 packs/day for 17 years    Types: Cigarettes  . Smokeless tobacco: Never Used  . Alcohol Use: 7.2 oz/week    0 Glasses of wine, 12 Cans of beer, 0 Shots of liquor per week     Comment: beer- 1-2 x's per month     Review of Systems  Constitutional: Negative for fever. Cardiovascular: Negative for chest pain. Respiratory: Negative for shortness of breath. Gastrointestinal: Positive for abdominal pain Genitourinary: Negative for dysuria. Musculoskeletal: Positive neck pain Skin: Negative for rash. Neurological: Negative for headaches, focal weakness or numbness.   10-point ROS otherwise negative.  ____________________________________________   PHYSICAL EXAM:  VITAL SIGNS:    97.9 F (36.6 C)  70  18   133/100 mmHg  100 %     Constitutional: Alert and oriented. Well appearing and in no distress. Eyes: Conjunctivae are normal. PERRL. Normal extraocular movements. ENT   Head: Normocephalic and atraumatic.   Nose: No congestion/rhinnorhea.   Mouth/Throat: Mucous membranes are moist.   Neck: No stridor. Hematological/Lymphatic/Immunilogical: No cervical lymphadenopathy. Cardiovascular: Normal rate, regular rhythm.  No murmurs, rubs, or gallops. Respiratory: Normal respiratory effort without tachypnea nor retractions. Breath sounds are clear and equal bilaterally. No wheezes/rales/rhonchi. Gastrointestinal: Soft and nontender. No distention. There is no CVA tenderness. Genitourinary: Deferred Musculoskeletal: Normal range of motion in all extremities. No joint effusions.  No lower extremity tenderness nor edema. Neurologic:  Normal speech and language. No gross focal neurologic deficits are appreciated.  Skin:  Skin is warm, dry and intact. No rash noted. Psychiatric: Mood and affect are normal. Speech and behavior  are normal. Patient exhibits appropriate insight and judgment.  ____________________________________________    LABS (pertinent positives/negatives)  None  ____________________________________________   EKG  None  ____________________________________________    RADIOLOGY  None  ____________________________________________   PROCEDURES  Procedure(s) performed: None  Critical Care performed: No  ____________________________________________   INITIAL IMPRESSION / ASSESSMENT AND PLAN / ED COURSE  Pertinent labs & imaging results that were available during my care of the patient were reviewed by me and considered in my medical decision making (see chart for details).  Patient presents to the emergency department today because of 3 days difficulty sleeping as well as chronic pain issues. Physical exam today is benign. I discussion with the patient that his sleep and medications to help for sleep would best be managed by primary care. I again advised the patient to stop Excedrin given that that is likely contributing to his abdominal pain. Will again give patient a primary care follow-up information.  ____________________________________________   FINAL CLINICAL IMPRESSION(S) / ED DIAGNOSES  Final diagnoses:  Chronic neck pain  Headache, unspecified headache type  Lack of adequate sleep     Note: This dictation was prepared with Dragon dictation. Any transcriptional errors that result from this process are unintentional    Nance Pear, MD 08/20/15 [phone]

## 2015-08-20 NOTE — ED Notes (Signed)
Patient states he hasnt been sleeping and having a headache.  C/o chronic neck pain where he takes 4 10mg  Percocet a day but states he does not have Medicaid any longer.  Pt continues to take Excedrin.  Pt also reports abdominal pain.  Pt states he hasn't talked to primary care MD and has not followed up. Dr. Archie Balboa in room talking to patient.

## 2015-08-20 NOTE — Discharge Instructions (Signed)
Please seek medical attention for any high fevers, chest pain, shortness of breath, change in behavior, persistent vomiting, bloody stool or any other new or concerning symptoms.  

## 2015-08-20 NOTE — ED Notes (Signed)
Patient given info for [facility] as well as [facility].  Patient already going to medication management clinic.

## 2015-08-20 NOTE — ED Notes (Signed)
Pt denies any SI/HI and states he just wants to sleep.

## 2015-08-20 NOTE — ED Notes (Signed)
Patient presents to the ED with difficulty sleeping and headache for 3 days.  Patient was recently seen by [name] and was prescribed mirtazipine to help patient sleep and patient states it is not helping him sleep.  Patient states he was recently taken off medicaid and can now not afford to go to the pain clinic.  Patient states he is under a lot of stress but denies any SI and HI.  Patient states, "I do not want to go to the basement and I do not want to go to that holding area."  Patient is calm and cooperative.  Appears tired.

## 2015-08-27 ENCOUNTER — Encounter: Payer: Self-pay | Admitting: Emergency Medicine

## 2015-08-27 ENCOUNTER — Emergency Department
Admission: EM | Admit: 2015-08-27 | Discharge: 2015-08-27 | Disposition: A | Discharge status: Home / Self Care | Payer: Medicaid Other | Attending: Emergency Medicine | Admitting: Emergency Medicine

## 2015-08-27 ENCOUNTER — Emergency Department: Payer: Medicaid Other

## 2015-08-27 DIAGNOSIS — Z7984 Long term (current) use of oral hypoglycemic drugs: Secondary | ICD-10-CM | POA: Diagnosis not present

## 2015-08-27 DIAGNOSIS — Z7902 Long term (current) use of antithrombotics/antiplatelets: Secondary | ICD-10-CM | POA: Diagnosis not present

## 2015-08-27 DIAGNOSIS — N189 Chronic kidney disease, unspecified: Secondary | ICD-10-CM | POA: Insufficient documentation

## 2015-08-27 DIAGNOSIS — Z5321 Procedure and treatment not carried out due to patient leaving prior to being seen by health care provider: Secondary | ICD-10-CM | POA: Insufficient documentation

## 2015-08-27 DIAGNOSIS — F329 Major depressive disorder, single episode, unspecified: Secondary | ICD-10-CM | POA: Insufficient documentation

## 2015-08-27 DIAGNOSIS — I129 Hypertensive chronic kidney disease with stage 1 through stage 4 chronic kidney disease, or unspecified chronic kidney disease: Secondary | ICD-10-CM | POA: Diagnosis not present

## 2015-08-27 DIAGNOSIS — R079 Chest pain, unspecified: Secondary | ICD-10-CM | POA: Diagnosis present

## 2015-08-27 DIAGNOSIS — Z7982 Long term (current) use of aspirin: Secondary | ICD-10-CM | POA: Insufficient documentation

## 2015-08-27 DIAGNOSIS — E1122 Type 2 diabetes mellitus with diabetic chronic kidney disease: Secondary | ICD-10-CM | POA: Insufficient documentation

## 2015-08-27 DIAGNOSIS — M1990 Unspecified osteoarthritis, unspecified site: Secondary | ICD-10-CM | POA: Insufficient documentation

## 2015-08-27 DIAGNOSIS — I252 Old myocardial infarction: Secondary | ICD-10-CM | POA: Diagnosis not present

## 2015-08-27 DIAGNOSIS — Z79899 Other long term (current) drug therapy: Secondary | ICD-10-CM | POA: Insufficient documentation

## 2015-08-27 DIAGNOSIS — I2510 Atherosclerotic heart disease of native coronary artery without angina pectoris: Secondary | ICD-10-CM | POA: Diagnosis not present

## 2015-08-27 NOTE — ED Notes (Signed)
Patient stating that he wants to leave. States "well they said my EKG looked good and I already had the xray and y'all will call me if that looks bad." I encouraged patient to stay so that he could be evaluated by an MD. I explained to him the labs that we were collecting and what each one looked for. Patient stated that "I know this is just stress, I just thought when my chest started hurting I would come get it check out but last time I was here y'all told me that I had a light heart attack and kept me over night and nothing was wrong. I'm not going through that again." Again, stressed to patient the need to stay and be evaluated, however patient was adamant about leaving and walked out of triage.

## 2015-08-27 NOTE — ED Notes (Addendum)
Pt to ed with c/o chest pain that started this am after smoking several cigarettes today.  States it was a small episode of chest pain in a couple of different places on the left side of his chest.  Pt states he has been under a lot of stress, was seen here about 1 month by dr clapacs and started on lexapro, however pt states it caused insomnia and nausea, so he stopped taking the meds.  Pt reports he continues to feel "stressed out"  Pt states he is taking klonopin but that is not helping either.  Reports he should be taking 3 per day but he is on 5 per day.  Pt denies SI, denies HI.

## 2015-09-04 ENCOUNTER — Emergency Department
Admission: EM | Admit: 2015-09-04 | Discharge: 2015-09-04 | Disposition: A | Discharge status: Home / Self Care | Payer: Medicaid Other | Attending: Emergency Medicine | Admitting: Emergency Medicine

## 2015-09-04 ENCOUNTER — Encounter: Payer: Self-pay | Admitting: Emergency Medicine

## 2015-09-04 DIAGNOSIS — Z7984 Long term (current) use of oral hypoglycemic drugs: Secondary | ICD-10-CM | POA: Diagnosis not present

## 2015-09-04 DIAGNOSIS — I129 Hypertensive chronic kidney disease with stage 1 through stage 4 chronic kidney disease, or unspecified chronic kidney disease: Secondary | ICD-10-CM | POA: Insufficient documentation

## 2015-09-04 DIAGNOSIS — F17210 Nicotine dependence, cigarettes, uncomplicated: Secondary | ICD-10-CM | POA: Diagnosis not present

## 2015-09-04 DIAGNOSIS — E1122 Type 2 diabetes mellitus with diabetic chronic kidney disease: Secondary | ICD-10-CM | POA: Insufficient documentation

## 2015-09-04 DIAGNOSIS — I2510 Atherosclerotic heart disease of native coronary artery without angina pectoris: Secondary | ICD-10-CM | POA: Insufficient documentation

## 2015-09-04 DIAGNOSIS — F329 Major depressive disorder, single episode, unspecified: Secondary | ICD-10-CM | POA: Insufficient documentation

## 2015-09-04 DIAGNOSIS — I252 Old myocardial infarction: Secondary | ICD-10-CM | POA: Diagnosis not present

## 2015-09-04 DIAGNOSIS — Z79899 Other long term (current) drug therapy: Secondary | ICD-10-CM | POA: Insufficient documentation

## 2015-09-04 DIAGNOSIS — F10120 Alcohol abuse with intoxication, uncomplicated: Secondary | ICD-10-CM

## 2015-09-04 DIAGNOSIS — Z7982 Long term (current) use of aspirin: Secondary | ICD-10-CM | POA: Insufficient documentation

## 2015-09-04 DIAGNOSIS — F10129 Alcohol abuse with intoxication, unspecified: Secondary | ICD-10-CM | POA: Diagnosis not present

## 2015-09-04 DIAGNOSIS — M479 Spondylosis, unspecified: Secondary | ICD-10-CM | POA: Insufficient documentation

## 2015-09-04 DIAGNOSIS — E860 Dehydration: Secondary | ICD-10-CM

## 2015-09-04 DIAGNOSIS — N189 Chronic kidney disease, unspecified: Secondary | ICD-10-CM | POA: Diagnosis not present

## 2015-09-04 LAB — BASIC METABOLIC PANEL
ANION GAP: 12 (ref 5–15)
BUN: 10 mg/dL (ref 6–20)
CHLORIDE: 104 mmol/L (ref 101–111)
CO2: 23 mmol/L (ref 22–32)
Calcium: 9.5 mg/dL (ref 8.9–10.3)
Creatinine, Ser: 0.87 mg/dL (ref 0.61–1.24)
GFR calc Af Amer: >60 mL/min (ref >60)
GFR calc non Af Amer: >60 mL/min (ref >60)
GLUCOSE: 98 mg/dL (ref 65–99)
POTASSIUM: 3.9 mmol/L (ref 3.5–5.1)
SODIUM: 139 mmol/L (ref 135–145)

## 2015-09-04 MED ORDER — SODIUM CHLORIDE 0.9 % IV BOLUS (SEPSIS)
1000.0000 mL | Freq: Once | INTRAVENOUS | Status: AC
  Administered 2015-09-04: 1000 mL via INTRAVENOUS

## 2015-09-04 MED ORDER — ONDANSETRON 4 MG PO TBDP: 4.0000 mg | ORAL_TABLET | Freq: Four times a day (QID) | ORAL | Status: DC | PRN

## 2015-09-04 MED ORDER — ONDANSETRON HCL 4 MG/2ML IJ SOLN
4.0000 mg | Freq: Once | INTRAMUSCULAR | Status: AC
  Administered 2015-09-04: 4 mg via INTRAVENOUS
  Filled 2015-09-04: qty 2

## 2015-09-04 MED ORDER — GLIMEPIRIDE 1 MG PO TABS
1.0000 mg | ORAL_TABLET | ORAL | Status: DC
Start: 2015-09-04 — End: 2016-04-05

## 2015-09-04 NOTE — ED Notes (Addendum)
States he has been depressed for awhile   Drank a lot yesterday  Feels like he is dehydrated with rapid heart rate states he just doesn t feel right   Dizziness and pain to neck  Denies SI or HI

## 2015-09-04 NOTE — ED Provider Notes (Signed)
[facility] Emergency Department Provider Note  ____________________________________________  Time seen: Approximately 4:11 PM  I have reviewed the triage vital signs and the nursing notes.   HISTORY  Chief Complaint Dehydration    HPI Michael Schmitt is a 45 y.o. male sense for evaluation of feeling dry and dehydrated, his heart occasionally racing when he is up walking around today. Patient reports that he drank heavily, but 18 beers yesterday and this morning when he got up he's been feeling lightheaded and feeling hung over.  Denies any other symptoms. No fevers, chills, chest pain, trouble breathing. Reports he feels "dry" and is been drinking water at home. Some mild nausea but no vomiting. He's had these symptoms may times in the past, namely when he drank heavily. He is not a daily drinker but he does report that occasionally on the weekends he will have several beers and then evening.  Patient also tells me that he is running low on his glimeperide and Percocet.   Past Medical History  Diagnosis Date  . Hypertension   . Mental disorder   . Anxiety   . Shortness of breath   . RLS (restless legs syndrome)     detected on sleep study  . GERD (gastroesophageal reflux disease)   . Headache(784.0)   . Blood dyscrasia     told that when he was young he was a" free bleeder"  . Arthritis     cerv. stenosis, spondylosis, HNP- lower back , has been followed in pain clinic, has  had injection s in cerv. area  . Chronic kidney disease     renal calculi- passed spontaneously  . Anxiety   . Depression   . Diabetes mellitus without complication ([place])   . Cervicogenic headache 07/24/2014  . Cervical spondylosis without myelopathy 07/24/2014  . Fatty liver   . CAD (coronary artery disease)   . MI, old     Patient Active Problem List   Diagnosis Date Noted  . Generalized anxiety disorder 08/11/2015  . Major depressive disorder, recurrent episode,  moderate with anxious distress ([place]) 06/21/2015  . NSTEMI (non-ST elevated myocardial infarction) ([place]) 06/20/2015  . STEMI (ST elevation myocardial infarction) ([place]) 01/01/2015  . Acute MI, inferoposterior wall ([place]) 01/01/2015  . Common bile duct dilation 10/06/2014  . Right lower quadrant abdominal pain 10/06/2014  . Loss of weight 10/06/2014  . Cervicogenic headache 07/24/2014  . Cervical spondylosis without myelopathy 07/24/2014  . Clinical depression 04/15/2013  . Acid reflux 04/15/2013  . BP (high blood pressure) 04/15/2013  . Encounter for other preprocedural examination 04/15/2013  . Nasal obstruction 02/12/2013  . Inflamed nasal mucosa 02/12/2013  . Sinus infection 02/12/2013  . Gastric catarrh 03/01/2011  . Allergic rhinitis 10/20/2010  . Airway hyperreactivity 10/20/2010  . Anxiety, generalized 10/20/2010  . Cervical pain 10/20/2010  . Current tobacco use 10/20/2010    Past Surgical History  Procedure Laterality Date  . Nasal sinus surgery      2005  . Anterior cervical decomp/discectomy fusion  11/13/2011    Procedure: ANTERIOR CERVICAL DECOMPRESSION/DISCECTOMY FUSION 1 LEVEL;  Surgeon: [name], MD;  Location: [facility];  Service: Neurosurgery;  Laterality: N/A;  Anterior Cervical Decompression/discectomy Fusion. Cervical three-four.  . Cardiac catheterization N/A 01/01/2015    Procedure: Left Heart Cath and Coronary Angiography;  Surgeon: [name], MD;  Location: [facility];  Service: Cardiovascular;  Laterality: N/A;    Current Outpatient Rx  Name  Route  Sig  Dispense  Refill  . aspirin 81 MG chewable tablet   Oral   Chew 1 tablet (81 mg total) by mouth daily.   30 tablet   3   . atorvastatin (LIPITOR) 80 MG tablet   Oral   Take 1 tablet (80 mg total) by mouth daily at 6 PM.   30 tablet   3   . cetirizine (ZYRTEC) 10 MG tablet               . clindamycin (CLEOCIN) 300 MG capsule   Oral   Take 1 capsule (300 mg total) by mouth 3  (three) times daily.   30 capsule   0   . clonazePAM (KLONOPIN) 1 MG tablet   Oral   Take 1 mg by mouth 3 (three) times daily.          . clopidogrel (PLAVIX) 75 MG tablet               . dicyclomine (BENTYL) 20 MG tablet   Oral   Take 1 tablet (20 mg total) by mouth 3 (three) times daily as needed (abdominal pain).   30 tablet   0   . escitalopram (LEXAPRO) 10 MG tablet   Oral   Take 1 tablet (10 mg total) by mouth daily.   30 tablet   0   . glimepiride (AMARYL) 1 MG tablet   Oral   Take 1 tablet (1 mg total) by mouth every morning.   30 tablet   0   . lisinopril (PRINIVIL,ZESTRIL) 5 MG tablet   Oral   Take 1 tablet (5 mg total) by mouth daily.   30 tablet   0   . metoprolol tartrate (LOPRESSOR) 25 MG tablet   Oral   Take 0.5 tablets (12.5 mg total) by mouth 2 (two) times daily.   30 tablet   3   . mirtazapine (REMERON) 45 MG tablet   Oral   Take 1 tablet (45 mg total) by mouth at bedtime.   30 tablet   0   . naproxen (NAPROSYN) 500 MG tablet   Oral   Take 1 tablet (500 mg total) by mouth 2 (two) times daily with a meal.   14 tablet   0   . nitroGLYCERIN (NITROSTAT) 0.4 MG SL tablet   Sublingual   Place 1 tablet (0.4 mg total) under the tongue every 5 (five) minutes x 3 doses as needed for chest pain.   25 tablet   12   . ondansetron (ZOFRAN ODT) 4 MG disintegrating tablet   Oral   Take 1 tablet (4 mg total) by mouth every 6 (six) hours as needed for nausea or vomiting.   20 tablet   0   . prasugrel (EFFIENT) 10 MG TABS tablet   Oral   Take 1 tablet (10 mg total) by mouth daily.   30 tablet   11   . ranitidine (ZANTAC) 150 MG tablet   Oral   Take 1 tablet (150 mg total) by mouth at bedtime.   30 tablet   1   . sucralfate (CARAFATE) 1 g tablet   Oral   Take 1 tablet (1 g total) by mouth 4 (four) times daily.   60 tablet   0     Allergies Prednisone; Hydrocodone; and Varenicline  Family History  Problem Relation Age of Onset   . Prostate cancer Father   . Depression Father   . Hypertension Mother   . Kidney Stones  Mother   . Anxiety disorder Mother   . Depression Mother   . COPD Sister   . Hypertension Sister   . Diabetes Sister   . Depression Sister   . Anxiety disorder Sister   . Seizures Sister     Social History Social History  Substance Use Topics  . Smoking status: Current Every Day Smoker -- 1.50 packs/day for 17 years    Types: Cigarettes  . Smokeless tobacco: Never Used  . Alcohol Use: 7.2 oz/week    0 Glasses of wine, 12 Cans of beer, 0 Shots of liquor per week     Comment: beer- 1-2 x's per month     Review of Systems Constitutional: No fever/chills Eyes: No visual changes. ENT: No sore throat.Mouth feels "dry" Cardiovascular: Denies chest pain. Respiratory: Denies shortness of breath. Gastrointestinal: No abdominal pain.  No vomiting.  No diarrhea.  No constipation. Genitourinary: Negative for dysuria. Musculoskeletal: Negative for back pain. Skin: Negative for rash. Neurological: Negative for headaches, focal weakness or numbness.  10-point ROS otherwise negative.  ____________________________________________   PHYSICAL EXAM:  VITAL SIGNS: ED Triage Vitals  Enc Vitals Group     BP 09/04/15 1533 141/89 mmHg     Pulse Rate 09/04/15 1533 102     Resp 09/04/15 1533 20     Temp 09/04/15 1533 98.2 F (36.8 C)     Temp Source 09/04/15 1533 Oral     SpO2 09/04/15 1533 96 %     Weight 09/04/15 1533 204 lb (92.534 kg)     Height 09/04/15 1533 6\' 2"  (1.88 m)     Head Cir --      Peak Flow --      Pain Score 09/04/15 1552 8     Pain Loc --      Pain Edu? --      Excl. in [place]? --    Constitutional: Alert and oriented. Well appearing and in no acute distress. Eyes: Conjunctivae are normal. PERRL. EOMI. Head: Atraumatic. Nose: No congestion/rhinnorhea. Mouth/Throat: Mucous membranes are Slightly dry.  Oropharynx non-erythematous. Neck: No stridor.   Cardiovascular: Normal  rate, regular rhythm. Grossly normal heart sounds.  Good peripheral circulation. Respiratory: Normal respiratory effort.  No retractions. Lungs CTAB. Gastrointestinal: Soft and nontender. No distention.Musculoskeletal: No lower extremity tenderness nor edema.  No joint effusions. Neurologic:  Normal speech and language. No gross focal neurologic deficits are appreciated. No pronator drift. No tremors. 5/5 strength all extremities.  Skin:  Skin is warm, dry and intact. No rash noted. Psychiatric: Mood and affect are normal. Speech and behavior are normal.  ____________________________________________   LABS (all labs ordered are listed, but only abnormal results are displayed)  Labs Reviewed  BASIC METABOLIC PANEL   ____________________________________________  EKG  Reviewed and interpreted by me at 1625 Normal sinus rhythm Heart rate 85 PR 142 QTc 450 QRS 90 Viewed and interpreted as normal sinus rhythm, RSR prime pattern noted without ischemic abnormality. No evidence of acute ischemic change ____________________________________________  RADIOLOGY   ____________________________________________   PROCEDURES  Procedure(s) performed: None  Critical Care performed: No  ____________________________________________   INITIAL IMPRESSION / ASSESSMENT AND PLAN / ED COURSE  Pertinent labs & imaging results that were available during my care of the patient were reviewed by me and considered in my medical decision making (see chart for details).  Patient presents for feeling dehydrated after drinking heavily yesterday. He is intact and normal neurologic examination, no cardiac or pulmonary complaints other than some  palpitations with walking around earlier. He is not expressing any chest pain. He is a diabetic and reports he ran out yesterday of his Amaryl, which I will refill. The patient is very pleasant, in no distress.  Discussed with the patient, he we will have him follow  up with primary care doctor.  ----------------------------------------- 4:42 PM on 09/04/2015 -----------------------------------------  Patient reports improvement, nausea improved and is able take by mouth by mouth well here. He is stable hemodynamics, plan to discharge him home. His sister is driving him. Return precautions and treatment recommendations and follow-up discussed with the patient who is agreeable with the plan. Counseled on alcohol use especially with excessive use and  the dangers of such. ____________________________________________   FINAL CLINICAL IMPRESSION(S) / ED DIAGNOSES  Final diagnoses:  Hangover without complication ([place])  Dehydration, mild      Delman Kitten, MD 09/04/15 1643

## 2015-09-04 NOTE — Discharge Instructions (Signed)

## 2015-09-05 ENCOUNTER — Emergency Department
Admission: EM | Admit: 2015-09-05 | Discharge: 2015-09-05 | Disposition: A | Discharge status: Home / Self Care | Payer: Medicaid Other | Attending: Emergency Medicine | Admitting: Emergency Medicine

## 2015-09-05 DIAGNOSIS — F339 Major depressive disorder, recurrent, unspecified: Secondary | ICD-10-CM | POA: Diagnosis not present

## 2015-09-05 DIAGNOSIS — I129 Hypertensive chronic kidney disease with stage 1 through stage 4 chronic kidney disease, or unspecified chronic kidney disease: Secondary | ICD-10-CM | POA: Insufficient documentation

## 2015-09-05 DIAGNOSIS — F99 Mental disorder, not otherwise specified: Secondary | ICD-10-CM | POA: Diagnosis not present

## 2015-09-05 DIAGNOSIS — Z79899 Other long term (current) drug therapy: Secondary | ICD-10-CM | POA: Insufficient documentation

## 2015-09-05 DIAGNOSIS — F17210 Nicotine dependence, cigarettes, uncomplicated: Secondary | ICD-10-CM | POA: Insufficient documentation

## 2015-09-05 DIAGNOSIS — N189 Chronic kidney disease, unspecified: Secondary | ICD-10-CM | POA: Insufficient documentation

## 2015-09-05 DIAGNOSIS — I2510 Atherosclerotic heart disease of native coronary artery without angina pectoris: Secondary | ICD-10-CM | POA: Diagnosis not present

## 2015-09-05 DIAGNOSIS — E1122 Type 2 diabetes mellitus with diabetic chronic kidney disease: Secondary | ICD-10-CM | POA: Insufficient documentation

## 2015-09-05 DIAGNOSIS — Z7982 Long term (current) use of aspirin: Secondary | ICD-10-CM | POA: Diagnosis not present

## 2015-09-05 DIAGNOSIS — I252 Old myocardial infarction: Secondary | ICD-10-CM | POA: Insufficient documentation

## 2015-09-05 LAB — COMPREHENSIVE METABOLIC PANEL
ALBUMIN: 4.5 g/dL (ref 3.5–5.0)
ALK PHOS: 104 U/L (ref 38–126)
ALT: 24 U/L (ref 17–63)
AST: 23 U/L (ref 15–41)
Anion gap: 9 (ref 5–15)
BILIRUBIN TOTAL: 0.3 mg/dL (ref 0.3–1.2)
BUN: 10 mg/dL (ref 6–20)
CALCIUM: 9.3 mg/dL (ref 8.9–10.3)
CO2: 22 mmol/L (ref 22–32)
Chloride: 106 mmol/L (ref 101–111)
Creatinine, Ser: 1 mg/dL (ref 0.61–1.24)
GFR calc Af Amer: >60 mL/min (ref >60)
GFR calc non Af Amer: >60 mL/min (ref >60)
GLUCOSE: 98 mg/dL (ref 65–99)
Potassium: 3.9 mmol/L (ref 3.5–5.1)
SODIUM: 137 mmol/L (ref 135–145)
Total Protein: 7.1 g/dL (ref 6.5–8.1)

## 2015-09-05 LAB — CBC WITH DIFFERENTIAL/PLATELET
BASOS ABS: 0.1 10*3/uL (ref 0–0.1)
Basophils Relative: 1 %
EOS PCT: 2 %
Eosinophils Absolute: 0.2 10*3/uL (ref 0–0.7)
HEMATOCRIT: 43.6 % (ref 40.0–52.0)
Hemoglobin: 14.9 g/dL (ref 13.0–18.0)
LYMPHS PCT: 22 %
Lymphs Abs: 2 10*3/uL (ref 1.0–3.6)
MCH: 30.7 pg (ref 26.0–34.0)
MCHC: 34.1 g/dL (ref 32.0–36.0)
MCV: 89.9 fL (ref 80.0–100.0)
Monocytes Absolute: 0.4 10*3/uL (ref 0.2–1.0)
Monocytes Relative: 5 %
Neutro Abs: 6.2 10*3/uL (ref 1.4–6.5)
Neutrophils Relative %: 70 %
PLATELETS: 185 10*3/uL (ref 150–440)
RBC: 4.85 MIL/uL (ref 4.40–5.90)
RDW: 14.3 % (ref 11.5–14.5)
WBC: 8.8 10*3/uL (ref 3.8–10.6)

## 2015-09-05 LAB — URINE DRUG SCREEN, QUALITATIVE (ARMC ONLY)
AMPHETAMINES, UR SCREEN: NOT DETECTED
BARBITURATES, UR SCREEN: NOT DETECTED
BENZODIAZEPINE, UR SCRN: POSITIVE — AB
Cannabinoid 50 Ng, Ur ~~LOC~~: NOT DETECTED
Cocaine Metabolite,Ur ~~LOC~~: NOT DETECTED
MDMA (Ecstasy)Ur Screen: NOT DETECTED
METHADONE SCREEN, URINE: NOT DETECTED
OPIATE, UR SCREEN: NOT DETECTED
Phencyclidine (PCP) Ur S: NOT DETECTED
TRICYCLIC, UR SCREEN: NOT DETECTED

## 2015-09-05 LAB — ETHANOL

## 2015-09-05 NOTE — ED Notes (Signed)
[place] set up in pt room. RN notified.

## 2015-09-05 NOTE — ED Notes (Signed)
Told registration he is here for suicidal thoughts. States having racing thoughts, and having chronic pain. States was on lexapro and been taking his girlfriends prozac. States thoughts of suicide but states has kids and would never kill himself.

## 2015-09-05 NOTE — ED Notes (Signed)
Pt given warm blanket and TV remote. MD in with pt now.

## 2015-09-05 NOTE — ED Provider Notes (Signed)
[facility] Emergency Department Provider Note  ____________________________________________  Time seen: Approximately 5:26 PM  I have reviewed the triage vital signs and the nursing notes.   HISTORY  Chief Complaint Psychiatric Evaluation    HPI Michael Schmitt is a 45 y.o. male presents for evaluation of depression area patient reports that he feels anxious at home all the time checking his blood pressure repeatedly since he had a heart attack years ago, and he feels like there is not much going on in his life and that he stuck in his "for walls". He reports that he is feeling much better yet than he did yesterday after having drank so much beer, and is no longer having nausea and lightheadedness but reports that he thinks he just needs to talk to a doctor about getting back on anxiety and depression medication. He also continues to deal with chronic neck pain.  No nausea vomiting fevers or chills. Eating and drinking well today. He is no longer drinking alcohol, and has no history of daily use or withdrawals per the patient.  He denies suicidal thoughts. He tells me that he would "swear on the Bible" and his children that he would never harm himself, and he is not feeling suicidal but just very "severely depressed".He also affirm to the nurse that he is not suicidal or homicidal.   Past Medical History  Diagnosis Date  . Hypertension   . Mental disorder   . Anxiety   . Shortness of breath   . RLS (restless legs syndrome)     detected on sleep study  . GERD (gastroesophageal reflux disease)   . Headache(784.0)   . Blood dyscrasia     told that when he was young he was a" free bleeder"  . Arthritis     cerv. stenosis, spondylosis, HNP- lower back , has been followed in pain clinic, has  had injection s in cerv. area  . Chronic kidney disease     renal calculi- passed spontaneously  . Anxiety   . Depression   . Diabetes mellitus without complication  ([place])   . Cervicogenic headache 07/24/2014  . Cervical spondylosis without myelopathy 07/24/2014  . Fatty liver   . CAD (coronary artery disease)   . MI, old     Patient Active Problem List   Diagnosis Date Noted  . Generalized anxiety disorder 08/11/2015  . Major depressive disorder, recurrent episode, moderate with anxious distress ([place]) 06/21/2015  . NSTEMI (non-ST elevated myocardial infarction) ([place]) 06/20/2015  . STEMI (ST elevation myocardial infarction) ([place]) 01/01/2015  . Acute MI, inferoposterior wall ([place]) 01/01/2015  . Common bile duct dilation 10/06/2014  . Right lower quadrant abdominal pain 10/06/2014  . Loss of weight 10/06/2014  . Cervicogenic headache 07/24/2014  . Cervical spondylosis without myelopathy 07/24/2014  . Clinical depression 04/15/2013  . Acid reflux 04/15/2013  . BP (high blood pressure) 04/15/2013  . Encounter for other preprocedural examination 04/15/2013  . Nasal obstruction 02/12/2013  . Inflamed nasal mucosa 02/12/2013  . Sinus infection 02/12/2013  . Gastric catarrh 03/01/2011  . Allergic rhinitis 10/20/2010  . Airway hyperreactivity 10/20/2010  . Anxiety, generalized 10/20/2010  . Cervical pain 10/20/2010  . Current tobacco use 10/20/2010    Past Surgical History  Procedure Laterality Date  . Nasal sinus surgery      2005  . Anterior cervical decomp/discectomy fusion  11/13/2011    Procedure: ANTERIOR CERVICAL DECOMPRESSION/DISCECTOMY FUSION 1 LEVEL;  Surgeon: [name], MD;  Location:  [place] [facility];  Service: Neurosurgery;  Laterality: N/A;  Anterior Cervical Decompression/discectomy Fusion. Cervical three-four.  . Cardiac catheterization N/A 01/01/2015    Procedure: Left Heart Cath and Coronary Angiography;  Surgeon: [name], MD;  Location: [facility];  Service: Cardiovascular;  Laterality: N/A;    Current Outpatient Rx  Name  Route  Sig  Dispense  Refill  . aspirin 81 MG chewable tablet   Oral   Chew 1 tablet (81 mg  total) by mouth daily.   30 tablet   3   . atorvastatin (LIPITOR) 80 MG tablet   Oral   Take 1 tablet (80 mg total) by mouth daily at 6 PM.   30 tablet   3   . cetirizine (ZYRTEC) 10 MG tablet               . clindamycin (CLEOCIN) 300 MG capsule   Oral   Take 1 capsule (300 mg total) by mouth 3 (three) times daily.   30 capsule   0   . clonazePAM (KLONOPIN) 1 MG tablet   Oral   Take 1 mg by mouth 3 (three) times daily.          . clopidogrel (PLAVIX) 75 MG tablet               . dicyclomine (BENTYL) 20 MG tablet   Oral   Take 1 tablet (20 mg total) by mouth 3 (three) times daily as needed (abdominal pain).   30 tablet   0   . escitalopram (LEXAPRO) 10 MG tablet   Oral   Take 1 tablet (10 mg total) by mouth daily.   30 tablet   0   . glimepiride (AMARYL) 1 MG tablet   Oral   Take 1 tablet (1 mg total) by mouth every morning.   30 tablet   0   . lisinopril (PRINIVIL,ZESTRIL) 5 MG tablet   Oral   Take 1 tablet (5 mg total) by mouth daily.   30 tablet   0   . metoprolol tartrate (LOPRESSOR) 25 MG tablet   Oral   Take 0.5 tablets (12.5 mg total) by mouth 2 (two) times daily.   30 tablet   3   . mirtazapine (REMERON) 45 MG tablet   Oral   Take 1 tablet (45 mg total) by mouth at bedtime.   30 tablet   0   . naproxen (NAPROSYN) 500 MG tablet   Oral   Take 1 tablet (500 mg total) by mouth 2 (two) times daily with a meal.   14 tablet   0   . nitroGLYCERIN (NITROSTAT) 0.4 MG SL tablet   Sublingual   Place 1 tablet (0.4 mg total) under the tongue every 5 (five) minutes x 3 doses as needed for chest pain.   25 tablet   12   . ondansetron (ZOFRAN ODT) 4 MG disintegrating tablet   Oral   Take 1 tablet (4 mg total) by mouth every 6 (six) hours as needed for nausea or vomiting.   20 tablet   0   . prasugrel (EFFIENT) 10 MG TABS tablet   Oral   Take 1 tablet (10 mg total) by mouth daily.   30 tablet   11   . ranitidine (ZANTAC) 150 MG  tablet   Oral   Take 1 tablet (150 mg total) by mouth at bedtime.   30 tablet   1   . sucralfate (CARAFATE) 1 g tablet  Oral   Take 1 tablet (1 g total) by mouth 4 (four) times daily.   60 tablet   0     Allergies Prednisone; Hydrocodone; and Varenicline  Family History  Problem Relation Age of Onset  . Prostate cancer Father   . Depression Father   . Hypertension Mother   . Kidney Stones Mother   . Anxiety disorder Mother   . Depression Mother   . COPD Sister   . Hypertension Sister   . Diabetes Sister   . Depression Sister   . Anxiety disorder Sister   . Seizures Sister     Social History Social History  Substance Use Topics  . Smoking status: Current Every Day Smoker -- 1.50 packs/day for 17 years    Types: Cigarettes  . Smokeless tobacco: Never Used  . Alcohol Use: 7.2 oz/week    0 Glasses of wine, 12 Cans of beer, 0 Shots of liquor per week     Comment: beer- 1-2 x's per month     Review of Systems Constitutional: No fever/chills Eyes: No visual changes. ENT: No sore throat. Chronic posterior neck pain. Cardiovascular: Denies chest pain. Respiratory: Denies shortness of breath. Gastrointestinal: No abdominal pain.  No nausea, no vomiting.  No diarrhea.  No constipation. Genitourinary: Negative for dysuria. Musculoskeletal: Negative for back pain. Skin: Negative for rash. Neurological: Negative for headaches, focal weakness or numbness.  Denies suicidal thoughts, but tells me that he is quite depressed and sad all the time.  10-point ROS otherwise negative.  ____________________________________________   PHYSICAL EXAM:  VITAL SIGNS: ED Triage Vitals  Enc Vitals Group     BP 09/05/15 1628 131/83 mmHg     Pulse Rate 09/05/15 1628 91     Resp 09/05/15 1628 16     Temp 09/05/15 1628 98.5 F (36.9 C)     Temp src --      SpO2 09/05/15 1628 96 %     Weight 09/05/15 1628 204 lb (92.534 kg)     Height 09/05/15 1628 6\' 2"  (1.88 m)     Head Cir  --      Peak Flow --      Pain Score 09/05/15 1628 0     Pain Loc --      Pain Edu? --      Excl. in [place]? --    Constitutional: Alert and oriented. Well appearing and in no acute distress.Watching television. Eyes: Conjunctivae are normal. PERRL. EOMI. Head: Atraumatic. Nose: No congestion/rhinnorhea. Mouth/Throat: Mucous membranes are moist.  Oropharynx non-erythematous. Neck: No stridor.   Cardiovascular: Normal rate, regular rhythm. Grossly normal heart sounds.  Good peripheral circulation. Respiratory: Normal respiratory effort.  No retractions. Lungs CTAB. Gastrointestinal: Soft and nontender. Musculoskeletal: No lower extremity tenderness nor edema.   Neurologic:  Normal speech and language. No gross focal neurologic deficits are appreciated. No gait instability. Skin:  Skin is warm, dry and intact. No rash noted. Psychiatric: Mood and affect are slightly flat. Speech and behavior are normal.  ____________________________________________   LABS (all labs ordered are listed, but only abnormal results are displayed)  Labs Reviewed  URINE DRUG SCREEN, QUALITATIVE ([facility] ONLY) - Abnormal; Notable for the following:    Benzodiazepine, Ur Scrn POSITIVE (*)    All other components within normal limits  COMPREHENSIVE METABOLIC PANEL  ETHANOL  CBC WITH DIFFERENTIAL/PLATELET   ____________________________________________  EKG   ____________________________________________  RADIOLOGY   ____________________________________________   PROCEDURES  Procedure(s) performed: None  Critical Care performed:  No  ____________________________________________   INITIAL IMPRESSION / ASSESSMENT AND PLAN / ED COURSE  Pertinent labs & imaging results that were available during my care of the patient were reviewed by me and considered in my medical decision making (see chart for details).  Patient presents for evaluation of symptoms of anxiety and depression. He denies to myself as  well as nurse any thoughts that he would harm himself and swears to me upon his children bottle that he would not. He denies any active paranoia or hallucinations. He came here voluntarily, and I will keep him here voluntarily at present based on what he is telling me. He is agreeable to waiting to see a psychiatrist to discuss his symptoms, and he has an outpatient psychiatrist and tells me he has an appointment on Tuesday.  ----------------------------------------- 7:50 PM on 09/05/2015 -----------------------------------------  Patient cleared for discharge by psychiatry. He has an appointment with his outpatient psychiatrist on Tuesday from which he will follow-up. Return precautions and treatment recommendations and follow-up discussed with the patient who is agreeable with the plan.  ____________________________________________   FINAL CLINICAL IMPRESSION(S) / ED DIAGNOSES  Final diagnoses:  Recurrent major depressive disorder, remission status unspecified ([facility])      Delman Kitten, MD 09/05/15 1950

## 2015-09-05 NOTE — Discharge Instructions (Signed)
You have been seen in the Emergency Department (ED) today for a psychiatric complaint.  You have been evaluated by psychiatry and we believe you are safe to be discharged from the hospital.    Please return to the ED immediately if you have ANY thoughts of hurting yourself or anyone else, so that we may help you.  Please avoid alcohol and drug use.  Follow up with your doctor Tuesday as already scheduled regarding today's ED visit.   Please follow up any other recommendations and clinic appointments provided by the psychiatry team that saw you in the Emergency Department.   Major Depressive Disorder Major depressive disorder is a mental illness. It also may be called clinical depression or unipolar depression. Major depressive disorder usually causes feelings of sadness, hopelessness, or helplessness. Some people with this disorder do not feel particularly sad but lose interest in doing things they used to enjoy (anhedonia). Major depressive disorder also can cause physical symptoms. It can interfere with work, school, relationships, and other normal everyday activities. The disorder varies in severity but is longer lasting and more serious than the sadness we all feel from time to time in our lives. Major depressive disorder often is triggered by stressful life events or major life changes. Examples of these triggers include divorce, loss of your job or home, a move, and the death of a family member or close friend. Sometimes this disorder occurs for no obvious reason at all. People who have family members with major depressive disorder or bipolar disorder are at higher risk for developing this disorder, with or without life stressors. Major depressive disorder can occur at any age. It may occur just once in your life (single episode major depressive disorder). It may occur multiple times (recurrent major depressive disorder). SYMPTOMS People with major depressive disorder have either anhedonia or  depressed mood on nearly a daily basis for at least 2 weeks or longer. Symptoms of depressed mood include:  Feelings of sadness (blue or down in the dumps) or emptiness.  Feelings of hopelessness or helplessness.  Tearfulness or episodes of crying (may be observed by others).  Irritability (children and adolescents). In addition to depressed mood or anhedonia or both, people with this disorder have at least four of the following symptoms:  Difficulty sleeping or sleeping too much.   Significant change (increase or decrease) in appetite or weight.   Lack of energy or motivation.  Feelings of guilt and worthlessness.   Difficulty concentrating, remembering, or making decisions.  Unusually slow movement (psychomotor retardation) or restlessness (as observed by others).   Recurrent wishes for death, recurrent thoughts of self-harm (suicide), or a suicide attempt. People with major depressive disorder commonly have persistent negative thoughts about themselves, other people, and the world. People with severe major depressive disorder may experiencedistorted beliefs or perceptions about the world (psychotic delusions). They also may see or hear things that are not real (psychotic hallucinations). DIAGNOSIS Major depressive disorder is diagnosed through an assessment by your health care provider. Your health care provider will ask aboutaspects of your daily life, such as mood,sleep, and appetite, to see if you have the diagnostic symptoms of major depressive disorder. Your health care provider may ask about your medical history and use of alcohol or drugs, including prescription medicines. Your health care provider also may do a physical exam and blood work. This is because certain medical conditions and the use of certain substances can cause major depressive disorder-like symptoms (secondary depression). Your health care provider  also may refer you to a mental health specialist for  further evaluation and treatment. TREATMENT It is important to recognize the symptoms of major depressive disorder and seek treatment. The following treatments can be prescribed for this disorder:   Medicine. Antidepressant medicines usually are prescribed. Antidepressant medicines are thought to correct chemical imbalances in the brain that are commonly associated with major depressive disorder. Other types of medicine may be added if the symptoms do not respond to antidepressant medicines alone or if psychotic delusions or hallucinations occur.  Talk therapy. Talk therapy can be helpful in treating major depressive disorder by providing support, education, and guidance. Certain types of talk therapy also can help with negative thinking (cognitive behavioral therapy) and with relationship issues that trigger this disorder (interpersonal therapy). A mental health specialist can help determine which treatment is best for you. Most people with major depressive disorder do well with a combination of medicine and talk therapy. Treatments involving electrical stimulation of the brain can be used in situations with extremely severe symptoms or when medicine and talk therapy do not work over time. These treatments include electroconvulsive therapy, transcranial magnetic stimulation, and vagal nerve stimulation.   This information is not intended to replace advice given to you by your health care provider. Make sure you discuss any questions you have with your health care provider.   Document Released: 07/08/2012 Document Revised: 04/03/2014 Document Reviewed: 07/08/2012 Elsevier Interactive Patient Education [employer].

## 2015-09-05 NOTE — ED Notes (Signed)
SOC set up at bedside. 

## 2015-09-05 NOTE — ED Notes (Signed)
Pt denies SI/HI at this time.  States tired of feeling sad and depressed but no thoughts of harming self.

## 2015-09-08 ENCOUNTER — Emergency Department
Admission: EM | Admit: 2015-09-08 | Discharge: 2015-09-08 | Disposition: A | Discharge status: Home / Self Care | Payer: Medicaid Other | Attending: Emergency Medicine | Admitting: Emergency Medicine

## 2015-09-08 ENCOUNTER — Encounter: Payer: Self-pay | Admitting: Emergency Medicine

## 2015-09-08 DIAGNOSIS — M1990 Unspecified osteoarthritis, unspecified site: Secondary | ICD-10-CM | POA: Diagnosis not present

## 2015-09-08 DIAGNOSIS — F411 Generalized anxiety disorder: Secondary | ICD-10-CM | POA: Insufficient documentation

## 2015-09-08 DIAGNOSIS — E1122 Type 2 diabetes mellitus with diabetic chronic kidney disease: Secondary | ICD-10-CM | POA: Insufficient documentation

## 2015-09-08 DIAGNOSIS — Z79899 Other long term (current) drug therapy: Secondary | ICD-10-CM | POA: Insufficient documentation

## 2015-09-08 DIAGNOSIS — I129 Hypertensive chronic kidney disease with stage 1 through stage 4 chronic kidney disease, or unspecified chronic kidney disease: Secondary | ICD-10-CM | POA: Diagnosis not present

## 2015-09-08 DIAGNOSIS — Z794 Long term (current) use of insulin: Secondary | ICD-10-CM | POA: Insufficient documentation

## 2015-09-08 DIAGNOSIS — N189 Chronic kidney disease, unspecified: Secondary | ICD-10-CM | POA: Insufficient documentation

## 2015-09-08 DIAGNOSIS — F331 Major depressive disorder, recurrent, moderate: Secondary | ICD-10-CM | POA: Insufficient documentation

## 2015-09-08 DIAGNOSIS — I252 Old myocardial infarction: Secondary | ICD-10-CM | POA: Insufficient documentation

## 2015-09-08 DIAGNOSIS — F17210 Nicotine dependence, cigarettes, uncomplicated: Secondary | ICD-10-CM | POA: Insufficient documentation

## 2015-09-08 DIAGNOSIS — M47812 Spondylosis without myelopathy or radiculopathy, cervical region: Secondary | ICD-10-CM | POA: Diagnosis not present

## 2015-09-08 DIAGNOSIS — Z792 Long term (current) use of antibiotics: Secondary | ICD-10-CM | POA: Insufficient documentation

## 2015-09-08 DIAGNOSIS — I2510 Atherosclerotic heart disease of native coronary artery without angina pectoris: Secondary | ICD-10-CM | POA: Diagnosis not present

## 2015-09-08 DIAGNOSIS — Z7982 Long term (current) use of aspirin: Secondary | ICD-10-CM | POA: Diagnosis not present

## 2015-09-08 DIAGNOSIS — R451 Restlessness and agitation: Secondary | ICD-10-CM | POA: Diagnosis present

## 2015-09-08 LAB — GLUCOSE, CAPILLARY: Glucose-Capillary: 106 mg/dL — ABNORMAL HIGH (ref 65–99)

## 2015-09-08 NOTE — ED Provider Notes (Signed)
[facility] Emergency Department Provider Note    ____________________________________________  Time seen: ~0940  I have reviewed the triage vital signs and the nursing notes.   HISTORY  Chief Complaint Suicidal Ideation  and Insomnia   History limited by: Not Limited   HPI Michael Schmitt is a 45 y.o. male who presents to the emergency department today because of concern for agitation and continued restlessness. Patient has been seen multiple times in this emergency department for these symptoms. I personally evaluated the patient multiple times in the past couple of months for similar complaints. Today patient states that he continues to have difficulty with sleeping. States he feels like his thoughts are racing. He saw his primary psychiatrist yesterday who put him on a new medication. Patient states he tried that but was still unable to sleep last night. Patient states that he currently does not have any thoughts of self-harm. He states he knows he would never hurt himself. He does have a history of diabetes states he just started his medication again today. Denies any recent fevers or chest pain.    Past Medical History  Diagnosis Date  . Hypertension   . Mental disorder   . Anxiety   . Shortness of breath   . RLS (restless legs syndrome)     detected on sleep study  . GERD (gastroesophageal reflux disease)   . Headache(784.0)   . Blood dyscrasia     told that when he was young he was a" free bleeder"  . Arthritis     cerv. stenosis, spondylosis, HNP- lower back , has been followed in pain clinic, has  had injection s in cerv. area  . Chronic kidney disease     renal calculi- passed spontaneously  . Anxiety   . Depression   . Diabetes mellitus without complication ([place])   . Cervicogenic headache 07/24/2014  . Cervical spondylosis without myelopathy 07/24/2014  . Fatty liver   . CAD (coronary artery disease)   . MI, old     Patient Active  Problem List   Diagnosis Date Noted  . Generalized anxiety disorder 08/11/2015  . Major depressive disorder, recurrent episode, moderate with anxious distress ([place]) 06/21/2015  . NSTEMI (non-ST elevated myocardial infarction) ([place]) 06/20/2015  . STEMI (ST elevation myocardial infarction) ([place]) 01/01/2015  . Acute MI, inferoposterior wall ([place]) 01/01/2015  . Common bile duct dilation 10/06/2014  . Right lower quadrant abdominal pain 10/06/2014  . Loss of weight 10/06/2014  . Cervicogenic headache 07/24/2014  . Cervical spondylosis without myelopathy 07/24/2014  . Clinical depression 04/15/2013  . Acid reflux 04/15/2013  . BP (high blood pressure) 04/15/2013  . Encounter for other preprocedural examination 04/15/2013  . Nasal obstruction 02/12/2013  . Inflamed nasal mucosa 02/12/2013  . Sinus infection 02/12/2013  . Gastric catarrh 03/01/2011  . Allergic rhinitis 10/20/2010  . Airway hyperreactivity 10/20/2010  . Anxiety, generalized 10/20/2010  . Cervical pain 10/20/2010  . Current tobacco use 10/20/2010    Past Surgical History  Procedure Laterality Date  . Nasal sinus surgery      2005  . Anterior cervical decomp/discectomy fusion  11/13/2011    Procedure: ANTERIOR CERVICAL DECOMPRESSION/DISCECTOMY FUSION 1 LEVEL;  Surgeon: [name], MD;  Location: [facility];  Service: Neurosurgery;  Laterality: N/A;  Anterior Cervical Decompression/discectomy Fusion. Cervical three-four.  . Cardiac catheterization N/A 01/01/2015    Procedure: Left Heart Cath and Coronary Angiography;  Surgeon: [name], MD;  Location: [facility];  Service: Cardiovascular;  Laterality: N/A;    Current Outpatient Rx  Name  Route  Sig  Dispense  Refill  . aspirin 81 MG chewable tablet   Oral   Chew 1 tablet (81 mg total) by mouth daily.   30 tablet   3   . atorvastatin (LIPITOR) 80 MG tablet   Oral   Take 1 tablet (80 mg total) by mouth daily at 6 PM.   30 tablet   3   . cetirizine  (ZYRTEC) 10 MG tablet               . clindamycin (CLEOCIN) 300 MG capsule   Oral   Take 1 capsule (300 mg total) by mouth 3 (three) times daily.   30 capsule   0   . clonazePAM (KLONOPIN) 1 MG tablet   Oral   Take 1 mg by mouth 3 (three) times daily.          . clopidogrel (PLAVIX) 75 MG tablet               . dicyclomine (BENTYL) 20 MG tablet   Oral   Take 1 tablet (20 mg total) by mouth 3 (three) times daily as needed (abdominal pain).   30 tablet   0   . escitalopram (LEXAPRO) 10 MG tablet   Oral   Take 1 tablet (10 mg total) by mouth daily.   30 tablet   0   . glimepiride (AMARYL) 1 MG tablet   Oral   Take 1 tablet (1 mg total) by mouth every morning.   30 tablet   0   . lisinopril (PRINIVIL,ZESTRIL) 5 MG tablet   Oral   Take 1 tablet (5 mg total) by mouth daily.   30 tablet   0   . metoprolol tartrate (LOPRESSOR) 25 MG tablet   Oral   Take 0.5 tablets (12.5 mg total) by mouth 2 (two) times daily.   30 tablet   3   . mirtazapine (REMERON) 45 MG tablet   Oral   Take 1 tablet (45 mg total) by mouth at bedtime.   30 tablet   0   . naproxen (NAPROSYN) 500 MG tablet   Oral   Take 1 tablet (500 mg total) by mouth 2 (two) times daily with a meal.   14 tablet   0   . nitroGLYCERIN (NITROSTAT) 0.4 MG SL tablet   Sublingual   Place 1 tablet (0.4 mg total) under the tongue every 5 (five) minutes x 3 doses as needed for chest pain.   25 tablet   12   . ondansetron (ZOFRAN ODT) 4 MG disintegrating tablet   Oral   Take 1 tablet (4 mg total) by mouth every 6 (six) hours as needed for nausea or vomiting.   20 tablet   0   . prasugrel (EFFIENT) 10 MG TABS tablet   Oral   Take 1 tablet (10 mg total) by mouth daily.   30 tablet   11   . ranitidine (ZANTAC) 150 MG tablet   Oral   Take 1 tablet (150 mg total) by mouth at bedtime.   30 tablet   1   . sucralfate (CARAFATE) 1 g tablet   Oral   Take 1 tablet (1 g total) by mouth 4 (four) times  daily.   60 tablet   0     Allergies Prednisone; Hydrocodone; and Varenicline  Family History  Problem Relation Age of Onset  .  Prostate cancer Father   . Depression Father   . Hypertension Mother   . Kidney Stones Mother   . Anxiety disorder Mother   . Depression Mother   . COPD Sister   . Hypertension Sister   . Diabetes Sister   . Depression Sister   . Anxiety disorder Sister   . Seizures Sister     Social History Social History  Substance Use Topics  . Smoking status: Current Every Day Smoker -- 1.50 packs/day for 17 years    Types: Cigarettes  . Smokeless tobacco: Never Used  . Alcohol Use: 7.2 oz/week    0 Glasses of wine, 12 Cans of beer, 0 Shots of liquor per week     Comment: beer- 1-2 x's per month     Review of Systems  Constitutional: Negative for fever. Cardiovascular: Negative for chest pain. Respiratory: Negative for shortness of breath. Gastrointestinal: Negative for abdominal pain, vomiting and diarrhea. Neurological: Negative for headaches, focal weakness or numbness.  10-point ROS otherwise negative.  ____________________________________________   PHYSICAL EXAM:  VITAL SIGNS: ED Triage Vitals  Enc Vitals Group     BP 09/08/15 0842 117/80 mmHg     Pulse Rate 09/08/15 0842 79     Resp 09/08/15 0842 18     Temp 09/08/15 0842 98.1 F (36.7 C)     Temp Source 09/08/15 0842 Oral     SpO2 09/08/15 0842 99 %     Weight 09/08/15 0842 191 lb (86.637 kg)     Height 09/08/15 0842 6\' 2"  (1.88 m)     Head Cir --      Peak Flow --      Pain Score 09/08/15 0843 8   Constitutional: Alert and oriented. Well appearing and in no distress. Eyes: Conjunctivae are normal. PERRL. Normal extraocular movements. ENT   Head: Normocephalic and atraumatic.   Nose: No congestion/rhinnorhea.   Mouth/Throat: Mucous membranes are moist.   Neck: No stridor. Hematological/Lymphatic/Immunilogical: No cervical lymphadenopathy. Cardiovascular: Normal  rate, regular rhythm.  No murmurs, rubs, or gallops. Respiratory: Normal respiratory effort without tachypnea nor retractions. Breath sounds are clear and equal bilaterally. No wheezes/rales/rhonchi. Gastrointestinal: Soft and nontender. No distention. There is no CVA tenderness. Genitourinary: Deferred Musculoskeletal: Normal range of motion in all extremities. No joint effusions.  No lower extremity tenderness nor edema. Neurologic:  Normal speech and language. No gross focal neurologic deficits are appreciated.  Skin:  Skin is warm, dry and intact. No rash noted. Psychiatric: Mood and affect are normal. Speech and behavior are normal. Patient exhibits appropriate insight and judgment.  ____________________________________________    LABS (pertinent positives/negatives)  Labs Reviewed  GLUCOSE, CAPILLARY - Abnormal; Notable for the following:    Glucose-Capillary 106 (*)    All other components within normal limits     ____________________________________________   EKG  None  ____________________________________________    RADIOLOGY  None  ____________________________________________   PROCEDURES  Procedure(s) performed: None  Critical Care performed: No  ____________________________________________   INITIAL IMPRESSION / ASSESSMENT AND PLAN / ED COURSE  Pertinent labs & imaging results that were available during my care of the patient were reviewed by me and considered in my medical decision making (see chart for details).  Patient presents to the emergency department today because of concerns for continued restlessness and agitation. Patient was seen by psychiatrist yesterday. He states he was put on some new medication but last night he was still unable to sleep. He stated that he was not suicidal.  Denies any current thoughts of self-harm. Has been seen by emergency psychiatry recently. Discussed with patient that he will need to continue to follow-up with  psychiatry. We will additionally give patient [facility] information.  ____________________________________________   FINAL CLINICAL IMPRESSION(S) / ED DIAGNOSES  Final diagnoses:  Generalized anxiety disorder     Note: This dictation was prepared with Dragon dictation. Any transcriptional errors that result from this process are unintentional    Nance Pear, MD 09/08/15 1008

## 2015-09-08 NOTE — ED Notes (Signed)
Pt presents to ED with reports of racing thoughts, insomnia, fear of being by himself and thoughts that if he were to die he would be better off. Pt denies active plan to commit suicide. Pt presents voluntarily. Pt states he saw his psychiatrist yesterday and his medications were changed.

## 2015-09-08 NOTE — ED Notes (Signed)
Pt states "racing thoughts", states he has occasional thoughts of SI but claims he would never actually hurt himself, states he is a single parent of two and fears everything, states he does not sleep, pt sees outpatient psych and states they are trying to wean him off clonopin and recently started on clorazepate

## 2015-09-08 NOTE — Discharge Instructions (Signed)
Please seek medical attention and help for any thoughts about wanting to harm herself, harm others, any concerning change in behavior, severe depression, inappropriate drug use or any other new or concerning symptoms.  Generalized Anxiety Disorder Generalized anxiety disorder (GAD) is a mental disorder. It interferes with life functions, including relationships, work, and school. GAD is different from normal anxiety, which everyone experiences at some point in their lives in response to specific life events and activities. Normal anxiety actually helps [place] prepare for and get through these life events and activities. Normal anxiety goes away after the event or activity is over.  GAD causes anxiety that is not necessarily related to specific events or activities. It also causes excess anxiety in proportion to specific events or activities. The anxiety associated with GAD is also difficult to control. GAD can vary from mild to severe. People with severe GAD can have intense waves of anxiety with physical symptoms (panic attacks).  SYMPTOMS The anxiety and worry associated with GAD are difficult to control. This anxiety and worry are related to many life events and activities and also occur more days than not for 6 months or longer. People with GAD also have three or more of the following symptoms (one or more in children):  Restlessness.   Fatigue.  Difficulty concentrating.   Irritability.  Muscle tension.  Difficulty sleeping or unsatisfying sleep. DIAGNOSIS GAD is diagnosed through an assessment by your health care provider. Your health care provider will ask you questions aboutyour mood,physical symptoms, and events in your life. Your health care provider may ask you about your medical history and use of alcohol or drugs, including prescription medicines. Your health care provider may also do a physical exam and blood tests. Certain medical conditions and the use of certain substances can  cause symptoms similar to those associated with GAD. Your health care provider may refer you to a mental health specialist for further evaluation. TREATMENT The following therapies are usually used to treat GAD:   Medication. Antidepressant medication usually is prescribed for long-term daily control. Antianxiety medicines may be added in severe cases, especially when panic attacks occur.   Talk therapy (psychotherapy). Certain types of talk therapy can be helpful in treating GAD by providing support, education, and guidance. A form of talk therapy called cognitive behavioral therapy can teach you healthy ways to think about and react to daily life events and activities.  Stress managementtechniques. These include yoga, meditation, and exercise and can be very helpful when they are practiced regularly. A mental health specialist can help determine which treatment is best for you. Some people see improvement with one therapy. However, other people require a combination of therapies.   This information is not intended to replace advice given to you by your health care provider. Make sure you discuss any questions you have with your health care provider.   Document Released: 07/08/2012 Document Revised: 04/03/2014 Document Reviewed: 07/08/2012 Elsevier Interactive Patient Education [employer].

## 2015-09-23 ENCOUNTER — Emergency Department
Admission: EM | Admit: 2015-09-23 | Discharge: 2015-09-23 | Disposition: A | Discharge status: Home / Self Care | Payer: Medicaid Other | Attending: Emergency Medicine | Admitting: Emergency Medicine

## 2015-09-23 ENCOUNTER — Encounter: Payer: Self-pay | Admitting: *Deleted

## 2015-09-23 DIAGNOSIS — F17210 Nicotine dependence, cigarettes, uncomplicated: Secondary | ICD-10-CM | POA: Diagnosis not present

## 2015-09-23 DIAGNOSIS — N189 Chronic kidney disease, unspecified: Secondary | ICD-10-CM | POA: Insufficient documentation

## 2015-09-23 DIAGNOSIS — I129 Hypertensive chronic kidney disease with stage 1 through stage 4 chronic kidney disease, or unspecified chronic kidney disease: Secondary | ICD-10-CM | POA: Diagnosis not present

## 2015-09-23 DIAGNOSIS — I2510 Atherosclerotic heart disease of native coronary artery without angina pectoris: Secondary | ICD-10-CM | POA: Insufficient documentation

## 2015-09-23 DIAGNOSIS — F99 Mental disorder, not otherwise specified: Secondary | ICD-10-CM | POA: Diagnosis not present

## 2015-09-23 DIAGNOSIS — E1122 Type 2 diabetes mellitus with diabetic chronic kidney disease: Secondary | ICD-10-CM | POA: Diagnosis not present

## 2015-09-23 DIAGNOSIS — Z79899 Other long term (current) drug therapy: Secondary | ICD-10-CM | POA: Diagnosis not present

## 2015-09-23 DIAGNOSIS — M1990 Unspecified osteoarthritis, unspecified site: Secondary | ICD-10-CM | POA: Insufficient documentation

## 2015-09-23 DIAGNOSIS — R45851 Suicidal ideations: Secondary | ICD-10-CM | POA: Diagnosis present

## 2015-09-23 DIAGNOSIS — I252 Old myocardial infarction: Secondary | ICD-10-CM | POA: Insufficient documentation

## 2015-09-23 DIAGNOSIS — F331 Major depressive disorder, recurrent, moderate: Secondary | ICD-10-CM | POA: Diagnosis not present

## 2015-09-23 LAB — COMPREHENSIVE METABOLIC PANEL
ALT: 22 U/L (ref 17–63)
AST: 19 U/L (ref 15–41)
Albumin: 4.5 g/dL (ref 3.5–5.0)
Alkaline Phosphatase: 102 U/L (ref 38–126)
Anion gap: 10 (ref 5–15)
BUN: 14 mg/dL (ref 6–20)
CHLORIDE: 106 mmol/L (ref 101–111)
CO2: 23 mmol/L (ref 22–32)
Calcium: 9.4 mg/dL (ref 8.9–10.3)
Creatinine, Ser: 1 mg/dL (ref 0.61–1.24)
Glucose, Bld: 99 mg/dL (ref 65–99)
POTASSIUM: 4.2 mmol/L (ref 3.5–5.1)
SODIUM: 139 mmol/L (ref 135–145)
Total Bilirubin: 0.4 mg/dL (ref 0.3–1.2)
Total Protein: 7.2 g/dL (ref 6.5–8.1)

## 2015-09-23 LAB — ACETAMINOPHEN LEVEL

## 2015-09-23 LAB — URINE DRUG SCREEN, QUALITATIVE (ARMC ONLY)
AMPHETAMINES, UR SCREEN: NOT DETECTED
Barbiturates, Ur Screen: NOT DETECTED
Benzodiazepine, Ur Scrn: POSITIVE — AB
COCAINE METABOLITE, UR ~~LOC~~: NOT DETECTED
Cannabinoid 50 Ng, Ur ~~LOC~~: NOT DETECTED
MDMA (ECSTASY) UR SCREEN: NOT DETECTED
METHADONE SCREEN, URINE: NOT DETECTED
OPIATE, UR SCREEN: NOT DETECTED
Phencyclidine (PCP) Ur S: NOT DETECTED
TRICYCLIC, UR SCREEN: POSITIVE — AB

## 2015-09-23 LAB — CBC
HEMATOCRIT: 46 % (ref 40.0–52.0)
HEMOGLOBIN: 15.6 g/dL (ref 13.0–18.0)
MCH: 30.7 pg (ref 26.0–34.0)
MCHC: 34 g/dL (ref 32.0–36.0)
MCV: 90.4 fL (ref 80.0–100.0)
Platelets: 215 10*3/uL (ref 150–440)
RBC: 5.09 MIL/uL (ref 4.40–5.90)
RDW: 14 % (ref 11.5–14.5)
WBC: 12.5 10*3/uL — AB (ref 3.8–10.6)

## 2015-09-23 LAB — GLUCOSE, CAPILLARY: Glucose-Capillary: 71 mg/dL (ref 65–99)

## 2015-09-23 LAB — SALICYLATE LEVEL

## 2015-09-23 LAB — ETHANOL: Alcohol, Ethyl (B): <5 mg/dL (ref <5)

## 2015-09-23 NOTE — ED Notes (Signed)
Pt given back all belongings at this time, pt states everything there at this time. Nothing missing.

## 2015-09-23 NOTE — Discharge Instructions (Signed)
Continue to follow up with Dr. Kasandra Knudsen. The Trintillix you're taking should help with your depression symptoms including difficulty sleeping, poor appetite, poor motivation, and sadness. You may need a dosage adjustment if the current dose is not improving your symptoms enough.  Major Depressive Disorder Major depressive disorder is a mental illness. It also may be called clinical depression or unipolar depression. Major depressive disorder usually causes feelings of sadness, hopelessness, or helplessness. Some people with this disorder do not feel particularly sad but lose interest in doing things they used to enjoy (anhedonia). Major depressive disorder also can cause physical symptoms. It can interfere with work, school, relationships, and other normal everyday activities. The disorder varies in severity but is longer lasting and more serious than the sadness we all feel from time to time in our lives. Major depressive disorder often is triggered by stressful life events or major life changes. Examples of these triggers include divorce, loss of your job or home, a move, and the death of a family member or close friend. Sometimes this disorder occurs for no obvious reason at all. People who have family members with major depressive disorder or bipolar disorder are at higher risk for developing this disorder, with or without life stressors. Major depressive disorder can occur at any age. It may occur just once in your life (single episode major depressive disorder). It may occur multiple times (recurrent major depressive disorder). SYMPTOMS People with major depressive disorder have either anhedonia or depressed mood on nearly a daily basis for at least 2 weeks or longer. Symptoms of depressed mood include:  Feelings of sadness (blue or down in the dumps) or emptiness.  Feelings of hopelessness or helplessness.  Tearfulness or episodes of crying (may be observed by others).  Irritability (children and  adolescents). In addition to depressed mood or anhedonia or both, people with this disorder have at least four of the following symptoms:  Difficulty sleeping or sleeping too much.   Significant change (increase or decrease) in appetite or weight.   Lack of energy or motivation.  Feelings of guilt and worthlessness.   Difficulty concentrating, remembering, or making decisions.  Unusually slow movement (psychomotor retardation) or restlessness (as observed by others).   Recurrent wishes for death, recurrent thoughts of self-harm (suicide), or a suicide attempt. People with major depressive disorder commonly have persistent negative thoughts about themselves, other people, and the world. People with severe major depressive disorder may experiencedistorted beliefs or perceptions about the world (psychotic delusions). They also may see or hear things that are not real (psychotic hallucinations). DIAGNOSIS Major depressive disorder is diagnosed through an assessment by your health care provider. Your health care provider will ask aboutaspects of your daily life, such as mood,sleep, and appetite, to see if you have the diagnostic symptoms of major depressive disorder. Your health care provider may ask about your medical history and use of alcohol or drugs, including prescription medicines. Your health care provider also may do a physical exam and blood work. This is because certain medical conditions and the use of certain substances can cause major depressive disorder-like symptoms (secondary depression). Your health care provider also may refer you to a mental health specialist for further evaluation and treatment. TREATMENT It is important to recognize the symptoms of major depressive disorder and seek treatment. The following treatments can be prescribed for this disorder:   Medicine. Antidepressant medicines usually are prescribed. Antidepressant medicines are thought to correct chemical  imbalances in the brain that are commonly associated  with major depressive disorder. Other types of medicine may be added if the symptoms do not respond to antidepressant medicines alone or if psychotic delusions or hallucinations occur.  Talk therapy. Talk therapy can be helpful in treating major depressive disorder by providing support, education, and guidance. Certain types of talk therapy also can help with negative thinking (cognitive behavioral therapy) and with relationship issues that trigger this disorder (interpersonal therapy). A mental health specialist can help determine which treatment is best for you. Most people with major depressive disorder do well with a combination of medicine and talk therapy. Treatments involving electrical stimulation of the brain can be used in situations with extremely severe symptoms or when medicine and talk therapy do not work over time. These treatments include electroconvulsive therapy, transcranial magnetic stimulation, and vagal nerve stimulation.   This information is not intended to replace advice given to you by your health care provider. Make sure you discuss any questions you have with your health care provider.   Document Released: 07/08/2012 Document Revised: 04/03/2014 Document Reviewed: 07/08/2012 Elsevier Interactive Patient Education [employer].

## 2015-09-23 NOTE — ED Notes (Signed)
Pt arrived to ED via EMS from home reporting headache and thoughts of hurting self. Pt reports he has been seeing outpatient psych. Klonopin 10 years 1mg  3 times a day. mertazepim 30mg  at night that is no longer working. Pt reports "I am about to go crazy"  Decreased appetite, 1 meal a day Pt reports he has been having worsening depression ever since receiving a DUI on May 5th. Pt reports he has had decreased appetite ( 1 meal a day) and has been having trouble sleeping through the night recently ( 3 hours of sleep). Pt reports he has been seeing an outpatient psychiatrist that had placed him on Klonopin that used to help but no longer helps. Then pt was placed on 10 mg melatonin which pt also reports did not help. Pt most recently reports taking XR Xanax which had not helped with depression but helped pt sleep through the night. Pt reports he has been sitting in his house not wanting to move from one spot and has felt like he can function due to decreased energy. Denies HI but reports having thoughts of hurting self but states, " I would never hurt myself because of my children." Pt denies substance abuse to triage RN.

## 2015-09-23 NOTE — ED Notes (Signed)
Pt currently on phone with wife, pt tolerated well. NAD noted. Calm and cooperative.

## 2015-09-23 NOTE — ED Provider Notes (Signed)
[facility] Emergency Department Provider Note  ____________________________________________  Time seen: 12:05 PM  I have reviewed the triage vital signs and the nursing notes.   HISTORY  Chief Complaint Depression   HPI NUR KRALIK is a 45 y.o. male who complains of chronic depression. He states that last time he was here [name] started him on mirtazapine 45 mg once daily and Lexapro daily. He then followed up with his psychiatrist Dr. Collie Siad who decreased her mirtazapine dose and discontinued the Lexapro and replaced it with Trintillix.  Patient reports that he still is having persistence of chronic insomnia, poor appetite, poor motivation and volition, sadness. Denies suicidal ideation, homicidal ideation, or hallucinations.  He does not relate any specific new concerns. He emphasizes "I just want to be happy".   Past Medical History  Diagnosis Date  . Hypertension   . Mental disorder   . Anxiety   . Shortness of breath   . RLS (restless legs syndrome)     detected on sleep study  . GERD (gastroesophageal reflux disease)   . Headache(784.0)   . Blood dyscrasia     told that when he was young he was a" free bleeder"  . Arthritis     cerv. stenosis, spondylosis, HNP- lower back , has been followed in pain clinic, has  had injection s in cerv. area  . Chronic kidney disease     renal calculi- passed spontaneously  . Anxiety   . Depression   . Diabetes mellitus without complication ([place])   . Cervicogenic headache 07/24/2014  . Cervical spondylosis without myelopathy 07/24/2014  . Fatty liver   . CAD (coronary artery disease)   . MI, old      Patient Active Problem List   Diagnosis Date Noted  . Generalized anxiety disorder 08/11/2015  . Major depressive disorder, recurrent episode, moderate with anxious distress ([place]) 06/21/2015  . NSTEMI (non-ST elevated myocardial infarction) ([place]) 06/20/2015  . STEMI (ST elevation myocardial  infarction) ([place]) 01/01/2015  . Acute MI, inferoposterior wall ([place]) 01/01/2015  . Common bile duct dilation 10/06/2014  . Right lower quadrant abdominal pain 10/06/2014  . Loss of weight 10/06/2014  . Cervicogenic headache 07/24/2014  . Cervical spondylosis without myelopathy 07/24/2014  . Clinical depression 04/15/2013  . Acid reflux 04/15/2013  . BP (high blood pressure) 04/15/2013  . Encounter for other preprocedural examination 04/15/2013  . Nasal obstruction 02/12/2013  . Inflamed nasal mucosa 02/12/2013  . Sinus infection 02/12/2013  . Gastric catarrh 03/01/2011  . Allergic rhinitis 10/20/2010  . Airway hyperreactivity 10/20/2010  . Anxiety, generalized 10/20/2010  . Cervical pain 10/20/2010  . Current tobacco use 10/20/2010     Past Surgical History  Procedure Laterality Date  . Nasal sinus surgery      2005  . Anterior cervical decomp/discectomy fusion  11/13/2011    Procedure: ANTERIOR CERVICAL DECOMPRESSION/DISCECTOMY FUSION 1 LEVEL;  Surgeon: [name], MD;  Location: [place] [facility];  Service: Neurosurgery;  Laterality: N/A;  Anterior Cervical Decompression/discectomy Fusion. Cervical three-four.  . Cardiac catheterization N/A 01/01/2015    Procedure: Left Heart Cath and Coronary Angiography;  Surgeon: [name], MD;  Location: [place] [facility];  Service: Cardiovascular;  Laterality: N/A;     Current Outpatient Rx  Name  Route  Sig  Dispense  Refill  . aspirin 81 MG chewable tablet   Oral   Chew 1 tablet (81 mg total) by mouth daily.   30 tablet   3   .  atorvastatin (LIPITOR) 80 MG tablet   Oral   Take 1 tablet (80 mg total) by mouth daily at 6 PM.   30 tablet   3   . cetirizine (ZYRTEC) 10 MG tablet               . clindamycin (CLEOCIN) 300 MG capsule   Oral   Take 1 capsule (300 mg total) by mouth 3 (three) times daily.   30 capsule   0   . clonazePAM (KLONOPIN) 1 MG tablet   Oral   Take 1 mg by mouth 3 (three) times daily.           . clopidogrel (PLAVIX) 75 MG tablet               . dicyclomine (BENTYL) 20 MG tablet   Oral   Take 1 tablet (20 mg total) by mouth 3 (three) times daily as needed (abdominal pain).   30 tablet   0   . escitalopram (LEXAPRO) 10 MG tablet   Oral   Take 1 tablet (10 mg total) by mouth daily.   30 tablet   0   . glimepiride (AMARYL) 1 MG tablet   Oral   Take 1 tablet (1 mg total) by mouth every morning.   30 tablet   0   . lisinopril (PRINIVIL,ZESTRIL) 5 MG tablet   Oral   Take 1 tablet (5 mg total) by mouth daily.   30 tablet   0   . metoprolol tartrate (LOPRESSOR) 25 MG tablet   Oral   Take 0.5 tablets (12.5 mg total) by mouth 2 (two) times daily.   30 tablet   3   . mirtazapine (REMERON) 45 MG tablet   Oral   Take 1 tablet (45 mg total) by mouth at bedtime.   30 tablet   0   . naproxen (NAPROSYN) 500 MG tablet   Oral   Take 1 tablet (500 mg total) by mouth 2 (two) times daily with a meal.   14 tablet   0   . nitroGLYCERIN (NITROSTAT) 0.4 MG SL tablet   Sublingual   Place 1 tablet (0.4 mg total) under the tongue every 5 (five) minutes x 3 doses as needed for chest pain.   25 tablet   12   . ondansetron (ZOFRAN ODT) 4 MG disintegrating tablet   Oral   Take 1 tablet (4 mg total) by mouth every 6 (six) hours as needed for nausea or vomiting.   20 tablet   0   . prasugrel (EFFIENT) 10 MG TABS tablet   Oral   Take 1 tablet (10 mg total) by mouth daily.   30 tablet   11   . ranitidine (ZANTAC) 150 MG tablet   Oral   Take 1 tablet (150 mg total) by mouth at bedtime.   30 tablet   1   . sucralfate (CARAFATE) 1 g tablet   Oral   Take 1 tablet (1 g total) by mouth 4 (four) times daily.   60 tablet   0      Allergies Prednisone; Hydrocodone; and Varenicline   Family History  Problem Relation Age of Onset  . Prostate cancer Father   . Depression Father   . Hypertension Mother   . Kidney Stones Mother   . Anxiety disorder Mother   .  Depression Mother   . COPD Sister   . Hypertension Sister   . Diabetes Sister   . Depression Sister   .  Anxiety disorder Sister   . Seizures Sister     Social History Social History  Substance Use Topics  . Smoking status: Current Every Day Smoker -- 1.50 packs/day for 17 years    Types: Cigarettes  . Smokeless tobacco: Never Used  . Alcohol Use: 7.2 oz/week    0 Glasses of wine, 12 Cans of beer, 0 Shots of liquor per week     Comment: beer- 1-2 x's per month     Review of Systems  Constitutional:   No fever or chills.   Cardiovascular:   No chest pain. Respiratory:   No dyspnea or cough. Gastrointestinal:   Negative for abdominal pain, vomiting and diarrhea.   Neurological:   Negative for headaches 10-point ROS otherwise negative.  ____________________________________________   PHYSICAL EXAM:  VITAL SIGNS: ED Triage Vitals  Enc Vitals Group     BP 09/23/15 1208 134/84 mmHg     Pulse Rate 09/23/15 1208 88     Resp 09/23/15 1208 17     Temp 09/23/15 1208 98.5 F (36.9 C)     Temp Source 09/23/15 1208 Oral     SpO2 09/23/15 1208 97 %     Weight 09/23/15 1208 190 lb (86.183 kg)     Height 09/23/15 1208 6\' 2"  (1.88 m)     Head Cir --      Peak Flow --      Pain Score --      Pain Loc --      Pain Edu? --      Excl. in [place]? --     Vital signs reviewed, nursing assessments reviewed.   Constitutional:   Alert and oriented. Well appearing and in no distress. Eyes:   No scleral icterus. Juliette Mangle.   ENT   Head:   Normocephalic and atraumatic.       Neck:   No stridor. No SubQ emphysema. No meningismus. Hematological/Lymphatic/Immunilogical:   No cervical lymphadenopathy. Cardiovascular:   RRR. Symmetric bilateral radial and DP pulses.  No murmurs.  Respiratory:   Normal respiratory effort without tachypnea nor retractions. Breath sounds are clear and equal bilaterally. No wheezes/rales/rhonchi. Gastrointestinal:   Soft and nontender. Non distended.  There is no CVA tenderness.  No rebound, rigidity, or guarding. Genitourinary:   deferred Musculoskeletal:   Nontender with normal range of motion in all extremities. No joint effusions.  No lower extremity tenderness.  No edema. Neurologic:   Normal speech and language.  CN 2-10 normal. Motor grossly intact. No gross focal neurologic deficits are appreciated.  Skin:    Skin is warm, dry and intact. No rash noted.  No petechiae, purpura, or bullae. Psychiatric: No SI HI or hallucinations. Linear thought, appropriately interactive. Goal-directed, future oriented, intact insight.    ____________________________________________    LABS (pertinent positives/negatives) (all labs ordered are listed, but only abnormal results are displayed) Labs Reviewed  CBC - Abnormal; Notable for the following:    WBC 12.5 (*)    All other components within normal limits  COMPREHENSIVE METABOLIC PANEL  ETHANOL  SALICYLATE LEVEL  ACETAMINOPHEN LEVEL  URINE DRUG SCREEN, QUALITATIVE ([place])   ____________________________________________   EKG    ____________________________________________    RADIOLOGY    ____________________________________________   PROCEDURES   ____________________________________________   INITIAL IMPRESSION / ASSESSMENT AND PLAN / ED COURSE  Pertinent labs & imaging results that were available during my care of the patient were reviewed by me and considered in my medical decision making (see chart  for details).  Patient well appearing no acute distress. Medically and psychiatrically stable. Not a danger to himself or others. Continue current outpatient regimen, continue following up with outpatient psychiatrist. He seems to have a desire to switch from clonazepam to Xanax which I will defer to his psychiatrist. No further interventions needed at this time.     ____________________________________________   FINAL CLINICAL IMPRESSION(S) / ED  DIAGNOSES  Final diagnoses:  Major depressive disorder, recurrent episode, moderate with anxious distress ([place])       Portions of this note were generated with dragon dictation software. Dictation errors may occur despite best attempts at proofreading.   Carrie Mew, MD 09/23/15 1330

## 2015-09-23 NOTE — ED Notes (Signed)
Pt arrived to ED via EMS from home reporting increased depression. Pt reports having decreased energy and recent inability to sleep through the night. Pt denies attempted suicide but reports having thoughts of hurting self. Pt is alert and oriented x 4 and in NAD.

## 2015-09-23 NOTE — ED Notes (Signed)
Patient called sister to come pick him up for discharge. Sister says her car is out of gas but she would get patient's son to come and get him. Patient given belongings and changed into clothes. Waiting on ride for discharge.

## 2015-09-23 NOTE — ED Notes (Signed)
Pt now currently speaking to sister on phone in regards to ride home. Pt made aware of discharge and getting a ride set up as we speak.

## 2015-09-25 ENCOUNTER — Emergency Department
Admission: EM | Admit: 2015-09-25 | Discharge: 2015-09-25 | Disposition: A | Discharge status: Home / Self Care | Payer: Medicaid Other | Attending: Emergency Medicine | Admitting: Emergency Medicine

## 2015-09-25 ENCOUNTER — Encounter: Payer: Self-pay | Admitting: Emergency Medicine

## 2015-09-25 DIAGNOSIS — E119 Type 2 diabetes mellitus without complications: Secondary | ICD-10-CM | POA: Diagnosis not present

## 2015-09-25 DIAGNOSIS — F17210 Nicotine dependence, cigarettes, uncomplicated: Secondary | ICD-10-CM | POA: Diagnosis not present

## 2015-09-25 DIAGNOSIS — T43015A Adverse effect of tricyclic antidepressants, initial encounter: Secondary | ICD-10-CM | POA: Insufficient documentation

## 2015-09-25 DIAGNOSIS — F331 Major depressive disorder, recurrent, moderate: Secondary | ICD-10-CM | POA: Insufficient documentation

## 2015-09-25 DIAGNOSIS — F419 Anxiety disorder, unspecified: Secondary | ICD-10-CM | POA: Diagnosis present

## 2015-09-25 DIAGNOSIS — I10 Essential (primary) hypertension: Secondary | ICD-10-CM | POA: Diagnosis not present

## 2015-09-25 DIAGNOSIS — I252 Old myocardial infarction: Secondary | ICD-10-CM | POA: Insufficient documentation

## 2015-09-25 DIAGNOSIS — Z79899 Other long term (current) drug therapy: Secondary | ICD-10-CM | POA: Diagnosis not present

## 2015-09-25 DIAGNOSIS — M47812 Spondylosis without myelopathy or radiculopathy, cervical region: Secondary | ICD-10-CM | POA: Insufficient documentation

## 2015-09-25 DIAGNOSIS — R002 Palpitations: Secondary | ICD-10-CM

## 2015-09-25 DIAGNOSIS — I2510 Atherosclerotic heart disease of native coronary artery without angina pectoris: Secondary | ICD-10-CM | POA: Insufficient documentation

## 2015-09-25 LAB — BASIC METABOLIC PANEL
Anion gap: 7 (ref 5–15)
BUN: 14 mg/dL (ref 6–20)
CO2: 25 mmol/L (ref 22–32)
CREATININE: 0.99 mg/dL (ref 0.61–1.24)
Calcium: 8.9 mg/dL (ref 8.9–10.3)
Chloride: 107 mmol/L (ref 101–111)
GFR calc Af Amer: >60 mL/min (ref >60)
Glucose, Bld: 112 mg/dL — ABNORMAL HIGH (ref 65–99)
Potassium: 4.2 mmol/L (ref 3.5–5.1)
SODIUM: 139 mmol/L (ref 135–145)

## 2015-09-25 LAB — TROPONIN I

## 2015-09-25 LAB — HEMOGLOBIN AND HEMATOCRIT, BLOOD
HEMATOCRIT: 42.1 % (ref 40.0–52.0)
HEMOGLOBIN: 14.5 g/dL (ref 13.0–18.0)

## 2015-09-25 MED ORDER — ASPIRIN 81 MG PO CHEW
324.0000 mg | CHEWABLE_TABLET | Freq: Once | ORAL | Status: AC
  Administered 2015-09-25: 324 mg via ORAL
  Filled 2015-09-25: qty 4

## 2015-09-25 NOTE — ED Provider Notes (Signed)
[facility] Emergency Department Provider Note  ____________________________________________  Time seen: Approximately 9:00 AM  I have reviewed the triage vital signs and the nursing notes.   HISTORY  Chief Complaint Chest Pain; Insomnia; and Anxiety    HPI Michael Schmitt is a 45 y.o. male presents for evaluation of poor sleep, and also tells me that this morning he started having a racing heart that lasted for about 30 minutes, and went away about time paramedics arrived.  Patient tells me that he's been having a lot of trouble sleeping, only sleeping 2-3 hours a day. He is been following up closely with and is seeing [name] and was recently on and continues on mirtazapine, and also tried doxepin last night for sleep. Reports at 2 AM he took a doxepin, and then he started to feel little lightheaded, and shortly thereafter he felt like his heart was racing. He was not having chest pain or tightness, but describes what is racing heart. This went away, but he advises the paramedics told him to come for evaluation.  He reports he had a heart attack once, but this feels nothing similar. He is not having shortness of breath. No wheezing. He reports all of his symptoms are improved, but does report that he is having ongoing insomnia. He is not feeling suicidal or having any thoughts of harming himself or others. Denies any hallucinations. He was no nausea, sweating, chest pain, or other symptoms.  Patient reports he does drink alcohol from time to time.   Past Medical History  Diagnosis Date  . Hypertension   . Mental disorder   . Anxiety   . Shortness of breath   . RLS (restless legs syndrome)     detected on sleep study  . GERD (gastroesophageal reflux disease)   . Headache(784.0)   . Blood dyscrasia     told that when he was young he was a" free bleeder"  . Arthritis     cerv. stenosis, spondylosis, HNP- lower back , has been followed in pain clinic,  has  had injection s in cerv. area  . Chronic kidney disease     renal calculi- passed spontaneously  . Anxiety   . Depression   . Diabetes mellitus without complication ([place])   . Cervicogenic headache 07/24/2014  . Cervical spondylosis without myelopathy 07/24/2014  . Fatty liver   . CAD (coronary artery disease)   . MI, old     Patient Active Problem List   Diagnosis Date Noted  . Generalized anxiety disorder 08/11/2015  . Major depressive disorder, recurrent episode, moderate with anxious distress ([place]) 06/21/2015  . NSTEMI (non-ST elevated myocardial infarction) ([place]) 06/20/2015  . STEMI (ST elevation myocardial infarction) ([place]) 01/01/2015  . Acute MI, inferoposterior wall ([place]) 01/01/2015  . Common bile duct dilation 10/06/2014  . Right lower quadrant abdominal pain 10/06/2014  . Loss of weight 10/06/2014  . Cervicogenic headache 07/24/2014  . Cervical spondylosis without myelopathy 07/24/2014  . Clinical depression 04/15/2013  . Acid reflux 04/15/2013  . BP (high blood pressure) 04/15/2013  . Encounter for other preprocedural examination 04/15/2013  . Nasal obstruction 02/12/2013  . Inflamed nasal mucosa 02/12/2013  . Sinus infection 02/12/2013  . Gastric catarrh 03/01/2011  . Allergic rhinitis 10/20/2010  . Airway hyperreactivity 10/20/2010  . Anxiety, generalized 10/20/2010  . Cervical pain 10/20/2010  . Current tobacco use 10/20/2010    Past Surgical History  Procedure Laterality Date  . Nasal sinus surgery  2005  . Anterior cervical decomp/discectomy fusion  11/13/2011    Procedure: ANTERIOR CERVICAL DECOMPRESSION/DISCECTOMY FUSION 1 LEVEL;  Surgeon: [name], MD;  Location: [place] [facility];  Service: Neurosurgery;  Laterality: N/A;  Anterior Cervical Decompression/discectomy Fusion. Cervical three-four.  . Cardiac catheterization N/A 01/01/2015    Procedure: Left Heart Cath and Coronary Angiography;  Surgeon: [name], MD;  Location: [place] [facility];   Service: Cardiovascular;  Laterality: N/A;    Current Outpatient Rx  Name  Route  Sig  Dispense  Refill  . atorvastatin (LIPITOR) 80 MG tablet   Oral   Take 1 tablet (80 mg total) by mouth daily at 6 PM.   30 tablet   3   . clonazePAM (KLONOPIN) 1 MG tablet   Oral   Take 1 mg by mouth 3 (three) times daily.          Marland Kitchen doxepin (SINEQUAN) 50 MG capsule   Oral   Take 50 mg by mouth at bedtime as needed.         Marland Kitchen glimepiride (AMARYL) 1 MG tablet   Oral   Take 1 tablet (1 mg total) by mouth every morning.   30 tablet   0   . lisinopril (PRINIVIL,ZESTRIL) 5 MG tablet   Oral   Take 1 tablet (5 mg total) by mouth daily.   30 tablet   0   . LORazepam (ATIVAN) 1 MG tablet   Oral   Take 1 mg by mouth 3 (three) times daily.         . mirtazapine (REMERON) 45 MG tablet   Oral   Take 1 tablet (45 mg total) by mouth at bedtime. Patient taking differently: Take 30 mg by mouth at bedtime.    30 tablet   0   . nitroGLYCERIN (NITROSTAT) 0.4 MG SL tablet   Sublingual   Place 1 tablet (0.4 mg total) under the tongue every 5 (five) minutes x 3 doses as needed for chest pain.   25 tablet   12   . ondansetron (ZOFRAN ODT) 4 MG disintegrating tablet   Oral   Take 1 tablet (4 mg total) by mouth every 6 (six) hours as needed for nausea or vomiting.   20 tablet   0   . propranolol (INDERAL) 10 MG tablet   Oral   Take 10 mg by mouth 2 (two) times daily.         . QUEtiapine (SEROQUEL) 100 MG tablet   Oral   Take 100 mg by mouth at bedtime.         . vortioxetine HBr (TRINTELLIX) 10 MG TABS   Oral   Take 1 tablet by mouth daily.           Allergies Prednisone; Hydrocodone; and Varenicline  Family History  Problem Relation Age of Onset  . Prostate cancer Father   . Depression Father   . Hypertension Mother   . Kidney Stones Mother   . Anxiety disorder Mother   . Depression Mother   . COPD Sister   . Hypertension Sister   . Diabetes Sister   .  Depression Sister   . Anxiety disorder Sister   . Seizures Sister     Social History Social History  Substance Use Topics  . Smoking status: Current Every Day Smoker -- 1.50 packs/day for 17 years    Types: Cigarettes  . Smokeless tobacco: Never Used  . Alcohol Use: 7.2 oz/week    0  Glasses of wine, 12 Cans of beer, 0 Shots of liquor per week     Comment: beer- 1-2 x's per month     Review of Systems Constitutional: No fever/chills Eyes: No visual changes. ENT: No sore throat. Cardiovascular: Denies chest pain. Respiratory: Denies shortness of breath. Gastrointestinal: No abdominal pain.  No nausea, no vomiting.  No diarrhea.  No constipation. Genitourinary: Negative for dysuria. Musculoskeletal: Negative for back pain. Skin: Negative for rash. Neurological: Negative for headaches, focal weakness or numbness. See history of present illness regarding poor sleep and depression. Denies suicidal ideation or thoughts of self-harm.  10-point ROS otherwise negative.  ____________________________________________   PHYSICAL EXAM:  VITAL SIGNS: ED Triage Vitals  Enc Vitals Group     BP 09/25/15 0848 124/89 mmHg     Pulse Rate 09/25/15 0848 83     Resp 09/25/15 0848 16     Temp 09/25/15 0848 97.9 F (36.6 C)     Temp Source 09/25/15 0848 Oral     SpO2 09/25/15 0848 96 %     Weight 09/25/15 0848 191 lb 6.4 oz (86.818 kg)     Height 09/25/15 0848 5\' 9"  (1.753 m)     Head Cir --      Peak Flow --      Pain Score 09/25/15 0850 4     Pain Loc --      Pain Edu? --      Excl. in [place]? --    Constitutional: Alert and oriented. Well appearing and in no acute distress. Eyes: Conjunctivae are normal. PERRL. EOMI. Head: Atraumatic. Nose: No congestion/rhinnorhea. Mouth/Throat: Mucous membranes are moist.  Oropharynx non-erythematous. Neck: No stridor.   Cardiovascular: Normal rate, regular rhythm. Grossly normal heart sounds.  Good peripheral circulation. Respiratory: Normal  respiratory effort.  No retractions. Lungs CTAB. Gastrointestinal: Soft and nontender. No distention. Musculoskeletal: No lower extremity tenderness nor edema.  No joint effusions. Neurologic:  Normal speech and language. Normal cranial nerve exam. No pronator drift. No tremors. Normal strength all extremities. No gross focal neurologic deficits are appreciated. Skin:  Skin is warm, dry and intact. No rash noted. Psychiatric: Mood and affect are normal. Speech and behavior are normal.  ____________________________________________   LABS (all labs ordered are listed, but only abnormal results are displayed)  Labs Reviewed  BASIC METABOLIC PANEL - Abnormal; Notable for the following:    Glucose, Bld 112 (*)    All other components within normal limits  TROPONIN I  HEMOGLOBIN AND HEMATOCRIT, BLOOD   ____________________________________________  EKG  Reviewed and interpreted me at 8:45 AM Normal sinus rhythm Heart rate 80 QRS 90 QTc 4:30 Reviewed and interpreted as normal sinus rhythm, no evidence of ischemic abnormality. ____________________________________________  RADIOLOGY   ____________________________________________   PROCEDURES  Procedure(s) performed: None  Critical Care performed: No  ____________________________________________   INITIAL IMPRESSION / ASSESSMENT AND PLAN / ED COURSE  Pertinent labs & imaging results that were available during my care of the patient were reviewed by me and considered in my medical decision making (see chart for details).  Patient presents for evaluation of a brief episode of palpitations. Was not associated with any concerning symptoms such as dyspnea, chest pain, severe weakness, or other concerns. He is very normal and reassuring examination at this time. EKG demonstrates no ischemic abnormality. His symptoms seem very atypical acute coronary syndrome, and having occurred about 2 AM I suspect that if his first troponin is  negative, there is no further need for  close observation in the ER but rather I think the patient would benefit by stopping doxepin, which she reports is a new medicine that he took for the first time last night. He reports to me that he will stop this medication.  He'll follow closely with [name], as well as his primary physician.  ----------------------------------------- 9:46 AM on 09/25/2015 -----------------------------------------  Patient resting comfortably. No complaints.  Return precautions and treatment recommendations and follow-up discussed with the patient who is agreeable with the plan.  ____________________________________________   FINAL CLINICAL IMPRESSION(S) / ED DIAGNOSES  Final diagnoses:  Palpitation  Doxepin adverse reaction, initial encounter      Delman Kitten, MD 09/25/15 [phone]

## 2015-09-25 NOTE — Discharge Instructions (Signed)
Palpitations  A palpitation is the feeling that your heartbeat is irregular or is faster than normal. It may feel like your heart is fluttering or skipping a beat. Palpitations are usually not a serious problem. However, in some cases, you may need further medical evaluation.  CAUSES   Palpitations can be caused by:  · Smoking.  · Caffeine or other stimulants, such as diet pills or energy drinks.  · Alcohol.  · Stress and anxiety.  · Strenuous physical activity.  · Fatigue.  · Certain medicines.  · Heart disease, especially if you have a history of irregular heart rhythms (arrhythmias), such as atrial fibrillation, atrial flutter, or supraventricular tachycardia.  · An improperly working pacemaker or defibrillator.  DIAGNOSIS   To find the cause of your palpitations, your health care provider will take your medical history and perform a physical exam. Your health care provider may also have you take a test called an ambulatory electrocardiogram (ECG). An ECG records your heartbeat patterns over a 24-hour period. You may also have other tests, such as:  · Transthoracic echocardiogram (TTE). During echocardiography, sound waves are used to evaluate how blood flows through your heart.  · Transesophageal echocardiogram (TEE).  · Cardiac monitoring. This allows your health care provider to monitor your heart rate and rhythm in real time.  · Holter monitor. This is a portable device that records your heartbeat and can help diagnose heart arrhythmias. It allows your health care provider to track your heart activity for several days, if needed.  · Stress tests by exercise or by giving medicine that makes the heart beat faster.  TREATMENT   Treatment of palpitations depends on the cause of your symptoms and can vary greatly. Most cases of palpitations do not require any treatment other than time, relaxation, and monitoring your symptoms. Other causes, such as atrial fibrillation, atrial flutter, or supraventricular  tachycardia, usually require further treatment.  HOME CARE INSTRUCTIONS   · Avoid:    Caffeinated coffee, tea, soft drinks, diet pills, and energy drinks.    Chocolate.    Alcohol.  · Stop smoking if you smoke.  · Reduce your stress and anxiety. Things that can help you relax include:    A method of controlling things in your body, such as your heartbeats, with your mind (biofeedback).    Yoga.    Meditation.    Physical activity such as swimming, jogging, or walking.  · Get plenty of rest and sleep.  SEEK MEDICAL CARE IF:   · You continue to have a fast or irregular heartbeat beyond 24 hours.  · Your palpitations occur more often.  SEEK IMMEDIATE MEDICAL CARE IF:  · You have chest pain or shortness of breath.  · You have a severe headache.  · You feel dizzy or you faint.  MAKE SURE YOU:  · Understand these instructions.  · Will watch your condition.  · Will get help right away if you are not doing well or get worse.     This information is not intended to replace advice given to you by your health care provider. Make sure you discuss any questions you have with your health care provider.     Document Released: 03/10/2000 Document Revised: 03/18/2013 Document Reviewed: 05/12/2011  Elsevier Interactive Patient Education ©2016 [employer]

## 2015-09-25 NOTE — ED Notes (Signed)
Patient brought in by [facility] from home for central chest pain, patient reports that he felt like his heart was racing. Patient also c/o not being able to sleep. Patient has a hx/o anxiety attack.

## 2015-10-01 ENCOUNTER — Ambulatory Visit: Payer: Self-pay | Admitting: Anesthesiology

## 2015-10-03 ENCOUNTER — Emergency Department
Admission: EM | Admit: 2015-10-03 | Discharge: 2015-10-03 | Disposition: A | Discharge status: Home / Self Care | Payer: Medicaid Other | Attending: Emergency Medicine | Admitting: Emergency Medicine

## 2015-10-03 ENCOUNTER — Emergency Department: Payer: Medicaid Other

## 2015-10-03 ENCOUNTER — Encounter: Payer: Self-pay | Admitting: Emergency Medicine

## 2015-10-03 DIAGNOSIS — I129 Hypertensive chronic kidney disease with stage 1 through stage 4 chronic kidney disease, or unspecified chronic kidney disease: Secondary | ICD-10-CM | POA: Insufficient documentation

## 2015-10-03 DIAGNOSIS — N189 Chronic kidney disease, unspecified: Secondary | ICD-10-CM | POA: Diagnosis not present

## 2015-10-03 DIAGNOSIS — F17210 Nicotine dependence, cigarettes, uncomplicated: Secondary | ICD-10-CM | POA: Diagnosis not present

## 2015-10-03 DIAGNOSIS — J0111 Acute recurrent frontal sinusitis: Secondary | ICD-10-CM | POA: Diagnosis not present

## 2015-10-03 DIAGNOSIS — M1990 Unspecified osteoarthritis, unspecified site: Secondary | ICD-10-CM | POA: Insufficient documentation

## 2015-10-03 DIAGNOSIS — I252 Old myocardial infarction: Secondary | ICD-10-CM | POA: Diagnosis not present

## 2015-10-03 DIAGNOSIS — F331 Major depressive disorder, recurrent, moderate: Secondary | ICD-10-CM | POA: Diagnosis not present

## 2015-10-03 DIAGNOSIS — I2510 Atherosclerotic heart disease of native coronary artery without angina pectoris: Secondary | ICD-10-CM | POA: Diagnosis not present

## 2015-10-03 DIAGNOSIS — G894 Chronic pain syndrome: Secondary | ICD-10-CM | POA: Insufficient documentation

## 2015-10-03 DIAGNOSIS — M940 Chondrocostal junction syndrome [Tietze]: Secondary | ICD-10-CM | POA: Insufficient documentation

## 2015-10-03 DIAGNOSIS — Z79899 Other long term (current) drug therapy: Secondary | ICD-10-CM | POA: Insufficient documentation

## 2015-10-03 DIAGNOSIS — Z7984 Long term (current) use of oral hypoglycemic drugs: Secondary | ICD-10-CM | POA: Insufficient documentation

## 2015-10-03 DIAGNOSIS — E1122 Type 2 diabetes mellitus with diabetic chronic kidney disease: Secondary | ICD-10-CM | POA: Insufficient documentation

## 2015-10-03 DIAGNOSIS — G8929 Other chronic pain: Secondary | ICD-10-CM

## 2015-10-03 DIAGNOSIS — R042 Hemoptysis: Secondary | ICD-10-CM | POA: Diagnosis present

## 2015-10-03 LAB — URINALYSIS COMPLETE WITH MICROSCOPIC (ARMC ONLY)
BACTERIA UA: NONE SEEN
Bilirubin Urine: NEGATIVE
Glucose, UA: NEGATIVE mg/dL
Hgb urine dipstick: NEGATIVE
KETONES UR: NEGATIVE mg/dL
Nitrite: NEGATIVE
Protein, ur: NEGATIVE mg/dL
Specific Gravity, Urine: 1.006 (ref 1.005–1.030)
pH: 6 (ref 5.0–8.0)

## 2015-10-03 LAB — COMPREHENSIVE METABOLIC PANEL
ALBUMIN: 4.3 g/dL (ref 3.5–5.0)
ALK PHOS: 94 U/L (ref 38–126)
ALT: 29 U/L (ref 17–63)
ANION GAP: 6 (ref 5–15)
AST: 28 U/L (ref 15–41)
BILIRUBIN TOTAL: 0.7 mg/dL (ref 0.3–1.2)
BUN: 11 mg/dL (ref 6–20)
CO2: 27 mmol/L (ref 22–32)
Calcium: 9.5 mg/dL (ref 8.9–10.3)
Chloride: 106 mmol/L (ref 101–111)
Creatinine, Ser: 1 mg/dL (ref 0.61–1.24)
GFR calc Af Amer: >60 mL/min (ref >60)
GFR calc non Af Amer: >60 mL/min (ref >60)
GLUCOSE: 91 mg/dL (ref 65–99)
POTASSIUM: 4 mmol/L (ref 3.5–5.1)
SODIUM: 139 mmol/L (ref 135–145)
TOTAL PROTEIN: 7.1 g/dL (ref 6.5–8.1)

## 2015-10-03 LAB — CBC
HEMATOCRIT: 45.9 % (ref 40.0–52.0)
HEMOGLOBIN: 16.3 g/dL (ref 13.0–18.0)
MCH: 31.9 pg (ref 26.0–34.0)
MCHC: 35.4 g/dL (ref 32.0–36.0)
MCV: 90.1 fL (ref 80.0–100.0)
Platelets: 191 10*3/uL (ref 150–440)
RBC: 5.09 MIL/uL (ref 4.40–5.90)
RDW: 14 % (ref 11.5–14.5)
WBC: 10.3 10*3/uL (ref 3.8–10.6)

## 2015-10-03 LAB — LIPASE, BLOOD: Lipase: 20 U/L (ref 11–51)

## 2015-10-03 LAB — TROPONIN I: Troponin I: <0.03 ng/mL (ref <0.03)

## 2015-10-03 MED ORDER — AZITHROMYCIN 250 MG PO TABS: ORAL_TABLET | ORAL | Status: AC

## 2015-10-03 MED ORDER — ACETAMINOPHEN 325 MG PO TABS
650.0000 mg | ORAL_TABLET | Freq: Once | ORAL | Status: DC
  Filled 2015-10-03: qty 2

## 2015-10-03 NOTE — ED Provider Notes (Signed)
[facility] Emergency Department Provider Note  ____________________________________________  Time seen: Approximately 12:07 PM  I have reviewed the triage vital signs and the nursing notes.   HISTORY  Chief Complaint Hematemesis and Abdominal Pain    HPI Michael Schmitt is a 45 y.o. male with a history of ongoing tobacco abuse, recurrent sinusitis, CAD S/P MI, and chronic neck pain presenting with multiple symptoms. The patient reports a chronic neck pain, for which he has been taking a significant amount of Goody's powder because he has been unable to visit his pain doctor in [place]. This is unchanged and he has no new neurologic symptoms. The patient also reports difficulty with sleep, which is managed by his psychiatrist, but he has been unable to follow up with his psychiatrist for this ongoing issue. The patient ascribes frontal and maxillary pressure over the past week with associated postnasal drip and build up of mucus when he lays down. This causes him to cough and he has noticed clear or yellowish sputum. When he coughs, he has a central chest pain, which is also worse with positional changes. He denies any shortness of breath, lower extremity swelling. This does not feel similar to his previous MI. The patient denies any nausea vomiting or diarrhea, has not had any sore throat or ear pain. After taking Goody's powder, the patient describes a diffuse abdominal cramping, 3 episodes of diarrhea which resolved with loperamide overnight. He thought he might feel better if he tried to make himself vomit so he "gagged himself" and then attempted to deeply cough "to see if I could bring up blood." He noticed some sputum with 1 blood streak. He denies any lightheadedness, syncope, or shortness of breath. He does have long fingernails and thinks he may have scratched his throat.   Past Medical History  Diagnosis Date  . Hypertension   . Mental disorder   . Anxiety    . Shortness of breath   . RLS (restless legs syndrome)     detected on sleep study  . GERD (gastroesophageal reflux disease)   . Headache(784.0)   . Blood dyscrasia     told that when he was young he was a" free bleeder"  . Arthritis     cerv. stenosis, spondylosis, HNP- lower back , has been followed in pain clinic, has  had injection s in cerv. area  . Chronic kidney disease     renal calculi- passed spontaneously  . Anxiety   . Depression   . Diabetes mellitus without complication ([place])   . Cervicogenic headache 07/24/2014  . Cervical spondylosis without myelopathy 07/24/2014  . Fatty liver   . CAD (coronary artery disease)   . MI, old     Patient Active Problem List   Diagnosis Date Noted  . Generalized anxiety disorder 08/11/2015  . Major depressive disorder, recurrent episode, moderate with anxious distress ([place]) 06/21/2015  . NSTEMI (non-ST elevated myocardial infarction) ([place]) 06/20/2015  . STEMI (ST elevation myocardial infarction) ([place]) 01/01/2015  . Acute MI, inferoposterior wall ([place]) 01/01/2015  . Common bile duct dilation 10/06/2014  . Right lower quadrant abdominal pain 10/06/2014  . Loss of weight 10/06/2014  . Cervicogenic headache 07/24/2014  . Cervical spondylosis without myelopathy 07/24/2014  . Clinical depression 04/15/2013  . Acid reflux 04/15/2013  . BP (high blood pressure) 04/15/2013  . Encounter for other preprocedural examination 04/15/2013  . Nasal obstruction 02/12/2013  . Inflamed nasal mucosa 02/12/2013  . Sinus infection 02/12/2013  . Gastric  catarrh 03/01/2011  . Allergic rhinitis 10/20/2010  . Airway hyperreactivity 10/20/2010  . Anxiety, generalized 10/20/2010  . Cervical pain 10/20/2010  . Current tobacco use 10/20/2010    Past Surgical History  Procedure Laterality Date  . Nasal sinus surgery      2005  . Anterior cervical decomp/discectomy fusion  11/13/2011    Procedure: ANTERIOR CERVICAL DECOMPRESSION/DISCECTOMY FUSION 1  LEVEL;  Surgeon: [name], MD;  Location: [facility];  Service: Neurosurgery;  Laterality: N/A;  Anterior Cervical Decompression/discectomy Fusion. Cervical three-four.  . Cardiac catheterization N/A 01/01/2015    Procedure: Left Heart Cath and Coronary Angiography;  Surgeon: [name], MD;  Location: [facility];  Service: Cardiovascular;  Laterality: N/A;    Current Outpatient Rx  Name  Route  Sig  Dispense  Refill  . atorvastatin (LIPITOR) 80 MG tablet   Oral   Take 1 tablet (80 mg total) by mouth daily at 6 PM.   30 tablet   3   . azithromycin (ZITHROMAX Z-PAK) 250 MG tablet      Take 2 tablets (500 mg) on  Day 1,  followed by 1 tablet (250 mg) once daily on Days 2 through 5.   6 each   0   . clonazePAM (KLONOPIN) 1 MG tablet   Oral   Take 1 mg by mouth 3 (three) times daily.          Marland Kitchen doxepin (SINEQUAN) 50 MG capsule   Oral   Take 50 mg by mouth at bedtime as needed.         Marland Kitchen glimepiride (AMARYL) 1 MG tablet   Oral   Take 1 tablet (1 mg total) by mouth every morning.   30 tablet   0   . lisinopril (PRINIVIL,ZESTRIL) 5 MG tablet   Oral   Take 1 tablet (5 mg total) by mouth daily.   30 tablet   0   . LORazepam (ATIVAN) 1 MG tablet   Oral   Take 1 mg by mouth 3 (three) times daily.         . mirtazapine (REMERON) 45 MG tablet   Oral   Take 1 tablet (45 mg total) by mouth at bedtime. Patient taking differently: Take 30 mg by mouth at bedtime.    30 tablet   0   . nitroGLYCERIN (NITROSTAT) 0.4 MG SL tablet   Sublingual   Place 1 tablet (0.4 mg total) under the tongue every 5 (five) minutes x 3 doses as needed for chest pain.   25 tablet   12   . ondansetron (ZOFRAN ODT) 4 MG disintegrating tablet   Oral   Take 1 tablet (4 mg total) by mouth every 6 (six) hours as needed for nausea or vomiting.   20 tablet   0   . propranolol (INDERAL) 10 MG tablet   Oral   Take 10 mg by mouth 2 (two) times daily.         . QUEtiapine  (SEROQUEL) 100 MG tablet   Oral   Take 100 mg by mouth at bedtime.         . vortioxetine HBr (TRINTELLIX) 10 MG TABS   Oral   Take 1 tablet by mouth daily.           Allergies Prednisone; Hydrocodone; and Varenicline  Family History  Problem Relation Age of Onset  . Prostate cancer Father   . Depression Father   . Hypertension Mother   .  Kidney Stones Mother   . Anxiety disorder Mother   . Depression Mother   . COPD Sister   . Hypertension Sister   . Diabetes Sister   . Depression Sister   . Anxiety disorder Sister   . Seizures Sister     Social History Social History  Substance Use Topics  . Smoking status: Current Every Day Smoker -- 1.50 packs/day for 17 years    Types: Cigarettes  . Smokeless tobacco: Never Used  . Alcohol Use: 7.2 oz/week    0 Glasses of wine, 12 Cans of beer, 0 Shots of liquor per week     Comment: beer- 1-2 x's per month     Review of Systems Constitutional: No fever/chills.No lightheadedness or syncope. No general malaise. Eyes: No visual changes. No eye discharge. ENT: No sore throat. No congestion or rhinorrhea. Positive maxillary and frontal sinus pressure. Positive postnasal drip. Cardiovascular: Positive chest pain. Denies palpitations. Respiratory: Denies shortness of breath.  Positive cough with a single episode of a mild blood streak with sputum. Gastrointestinal: No abdominal pain.  No nausea, no vomiting.  Positive diarrhea, now resolved.  No constipation. No melena. No bright red blood per rectum. Genitourinary: Negative for dysuria. Musculoskeletal: Negative for back pain. Positive chronic neck pain which is unchanged. Skin: Negative for rash. Neurological: Negative for headaches. No focal numbness, tingling or weakness.  Psychiatric:Positive difficulty sleeping. 10-point ROS otherwise negative.  ____________________________________________   PHYSICAL EXAM:  VITAL SIGNS: ED Triage Vitals  Enc Vitals Group     BP  10/03/15 0853 129/85 mmHg     Pulse Rate 10/03/15 0853 77     Resp 10/03/15 0853 18     Temp 10/03/15 0853 98.3 F (36.8 C)     Temp src --      SpO2 10/03/15 0849 98 %     Weight 10/03/15 0853 186 lb (84.369 kg)     Height 10/03/15 0853 6\' 2"  (1.88 m)     Head Cir --      Peak Flow --      Pain Score 10/03/15 0854 7     Pain Loc --      Pain Edu? --      Excl. in [place]? --     Constitutional: Alert and oriented. Well appearing and in no acute distress. Answers questions appropriately. Eyes: Conjunctivae are normal.  EOMI. No scleral icterus. Head: Atraumatic.Positive mild tenderness to palpation over the frontal and maxillary sinuses. Nose: No congestion/rhinnorhea. Mouth/Throat: Mucous membranes are moist. No posterior pharyngeal erythema. No tonsillar swelling or exudate. No posterior palate asymmetry. Uvula is midline. No evidence of scratch or laceration in the posterior pharynx. Neck: No stridor.  Supple.  No JVD. Cardiovascular: Normal rate, regular rhythm. No murmurs, rubs or gallops.  Respiratory: Normal respiratory effort.  No accessory muscle use or retractions. Lungs CTAB.  No wheezes, rales or ronchi. Gastrointestinal: Soft, nontender and nondistended.  No guarding or rebound.  No peritoneal signs. Musculoskeletal: No LE edema. No ttp in the calves or palpable cords.  Negative Homan's sign. Positive long fingernails on all the fingers. Neurologic:  A&Ox3.  Speech is clear.  Face and smile are symmetric.  EOMI.  Moves all extremities well. Skin:  Skin is warm, dry and intact. No rash noted. Psychiatric: Mood is normal with flat affect. Speech and behavior are normal.  Normal judgement.  ____________________________________________   LABS (all labs ordered are listed, but only abnormal results are displayed)  Labs Reviewed  URINALYSIS  COMPLETEWITH MICROSCOPIC ([facility] ONLY) - Abnormal; Notable for the following:    Color, Urine YELLOW (*)    APPearance CLEAR (*)     Leukocytes, UA TRACE (*)    Squamous Epithelial / LPF 0-5 (*)    All other components within normal limits  COMPREHENSIVE METABOLIC PANEL  CBC  TROPONIN I  LIPASE, BLOOD   ____________________________________________  EKG  ED ECG REPORT I, Eula Listen, the attending physician, personally viewed and interpreted this ECG.   Date: 10/03/2015  EKG Time: 853  Rate: 82  Rhythm: normal sinus rhythm  Axis: normal  Intervals:none  ST&T Change: Specific T-wave inversion in V1. No ST elevation. No evidence of ischemic changes.  ____________________________________________  RADIOLOGY  Dg Chest 2 View  10/03/2015  CLINICAL DATA:  Chest pain EXAM: CHEST  2 VIEW COMPARISON:  08/27/2015 chest radiograph. FINDINGS: Coronary stent overlies the left heart. Stable cardiomediastinal silhouette with normal heart size. No pneumothorax. No pleural effusion. Lungs appear clear, with no acute consolidative airspace disease and no pulmonary edema. IMPRESSION: No active cardiopulmonary disease. Electronically Signed   By: Ilona Sorrel M.D.   On: 10/03/2015 09:44    ____________________________________________   PROCEDURES  Procedure(s) performed: None  Critical Care performed: No ____________________________________________   INITIAL IMPRESSION / ASSESSMENT AND PLAN / ED COURSE  Pertinent labs & imaging results that were available during my care of the patient were reviewed by me and considered in my medical decision making (see chart for details).  45 y.o. male with multiple chronic medical and psychiatric comorbidities presenting with multiple vague symptoms. I have encouraged the patient to reestablish care with his primary pain doctor under him to discuss his chronic pain issues. I have encouraged the patient to recheck his psychiatrist to discuss his sleep issues. I'll plan to treat the patient for sinusitis with a Z-Pak, and have him follow-up with his primary care physician for  this. The patient's chest pain is atypical and unlikely to be cardiac in origin. His EKG does not show any ischemic changes and his troponin which is negative. The patient's chest x-ray does not show pneumonia, nor any acute cardiopulmonary process. At this time, the patient is stable for discharge and I will have him follow-up as an outpatient. I discussed return precautions as well as follow-up instructions.  ____________________________________________  FINAL CLINICAL IMPRESSION(S) / ED DIAGNOSES  Final diagnoses:  Acute recurrent frontal sinusitis  Costochondritis, acute  Chronic pain      NEW MEDICATIONS STARTED DURING THIS VISIT:  New Prescriptions   AZITHROMYCIN (ZITHROMAX Z-PAK) 250 MG TABLET    Take 2 tablets (500 mg) on  Day 1,  followed by 1 tablet (250 mg) once daily on Days 2 through 5.     Eula Listen, MD 10/03/15 1218

## 2015-10-03 NOTE — ED Notes (Signed)
MD at bedside for eval.

## 2015-10-03 NOTE — Discharge Instructions (Signed)
Please make an appointment with your chronic pain doctor to discuss long-term pain management.  Please take the entire course of antibiotics for your sinus infection. Please make a follow-up appointment with your primary care physician in 3-5 days.  Return to the emergency department if you develop severe pain, lightheadedness or fainting, chest pain, shortness of breath, or any other symptoms concerning to you.

## 2015-10-03 NOTE — ED Notes (Signed)
Pt currently in xray. Will assess when placed in room

## 2015-10-03 NOTE — ED Notes (Signed)
Pt via EMS from home.  Pt states he has been having chest pain and abdominal pain for several days.  Pt also has hx of insomnia and states he has not been sleeping either. Pt tried to make him self vomit and when he did a small amount of  bright red came up.  Pt states he has hx of ulcers.  Pt states he uses about 5-6 Goody's powder.  Hx of gastritis as well.

## 2015-11-11 DIAGNOSIS — R000 Tachycardia, unspecified: Secondary | ICD-10-CM | POA: Diagnosis not present

## 2015-11-24 ENCOUNTER — Emergency Department: Payer: Medicare Other

## 2015-11-24 ENCOUNTER — Emergency Department
Admission: EM | Admit: 2015-11-24 | Discharge: 2015-11-24 | Disposition: A | Discharge status: Home / Self Care | Payer: Medicare Other | Attending: Emergency Medicine | Admitting: Emergency Medicine

## 2015-11-24 DIAGNOSIS — Z79899 Other long term (current) drug therapy: Secondary | ICD-10-CM | POA: Diagnosis not present

## 2015-11-24 DIAGNOSIS — N189 Chronic kidney disease, unspecified: Secondary | ICD-10-CM | POA: Diagnosis not present

## 2015-11-24 DIAGNOSIS — E1122 Type 2 diabetes mellitus with diabetic chronic kidney disease: Secondary | ICD-10-CM | POA: Insufficient documentation

## 2015-11-24 DIAGNOSIS — R079 Chest pain, unspecified: Secondary | ICD-10-CM | POA: Diagnosis not present

## 2015-11-24 DIAGNOSIS — I252 Old myocardial infarction: Secondary | ICD-10-CM | POA: Diagnosis not present

## 2015-11-24 DIAGNOSIS — F17210 Nicotine dependence, cigarettes, uncomplicated: Secondary | ICD-10-CM | POA: Insufficient documentation

## 2015-11-24 DIAGNOSIS — I129 Hypertensive chronic kidney disease with stage 1 through stage 4 chronic kidney disease, or unspecified chronic kidney disease: Secondary | ICD-10-CM | POA: Insufficient documentation

## 2015-11-24 DIAGNOSIS — R0789 Other chest pain: Secondary | ICD-10-CM | POA: Diagnosis not present

## 2015-11-24 DIAGNOSIS — Z7984 Long term (current) use of oral hypoglycemic drugs: Secondary | ICD-10-CM | POA: Insufficient documentation

## 2015-11-24 DIAGNOSIS — I2510 Atherosclerotic heart disease of native coronary artery without angina pectoris: Secondary | ICD-10-CM | POA: Insufficient documentation

## 2015-11-24 LAB — COMPREHENSIVE METABOLIC PANEL
ALBUMIN: 4.4 g/dL (ref 3.5–5.0)
ALT: 38 U/L (ref 17–63)
ANION GAP: 6 (ref 5–15)
AST: 29 U/L (ref 15–41)
Alkaline Phosphatase: 110 U/L (ref 38–126)
BILIRUBIN TOTAL: 0.9 mg/dL (ref 0.3–1.2)
BUN: 15 mg/dL (ref 6–20)
CO2: 28 mmol/L (ref 22–32)
Calcium: 9.4 mg/dL (ref 8.9–10.3)
Chloride: 102 mmol/L (ref 101–111)
Creatinine, Ser: 1.14 mg/dL (ref 0.61–1.24)
Glucose, Bld: 92 mg/dL (ref 65–99)
POTASSIUM: 4.3 mmol/L (ref 3.5–5.1)
Sodium: 136 mmol/L (ref 135–145)
TOTAL PROTEIN: 7.8 g/dL (ref 6.5–8.1)

## 2015-11-24 LAB — TROPONIN I

## 2015-11-24 MED ORDER — ASPIRIN 81 MG PO CHEW
324.0000 mg | CHEWABLE_TABLET | Freq: Once | ORAL | Status: AC
  Administered 2015-11-24: 324 mg via ORAL
  Filled 2015-11-24: qty 4

## 2015-11-24 NOTE — Discharge Instructions (Signed)

## 2015-11-24 NOTE — ED Triage Notes (Signed)
Pt arrives here via [facility] from home with reports of chest pain and anxiety  Pt has a history of chronic pain and goes to the pain clinic in [place]  He also sees his psychiatrist in [place] tomorrow but he does not have any gas to get there  Pt reports klonipin is not working for him and he cannot get to his appointment

## 2015-11-24 NOTE — ED Provider Notes (Signed)
[facility] Emergency Department Provider Note  ____________________________________________   First MD Initiated Contact with Patient 11/24/15 1003     (approximate)  I have reviewed the triage vital signs and the nursing notes.   HISTORY  Chief Complaint Chest Pain    HPI Michael Schmitt is a 45 y.o. male with a history of prior episodes of chest pain, coronary artery disease, anxiety, unspecified mental disorder, recurrent tobacco use, and chronic pain who presents for evaluation of anxiety, running out of his benzodiazepines, and chest pain.  He states that he is worried that he will not be able to get to his psychiatrist next week to have a refill for his benzodiazepines.  He is down to only a few tablets left.  He has felt gradually worsening anxiety recently.  Last night he developed gradual onset of persistent "tingling and sharp pain" in his anterior chest.  He denies fever/chills, shortness of breath, abdominal pain, episodes of diaphoresis, nausea/vomiting.  Nothing makes it better and nothing makes it worse.   Past Medical History:  Diagnosis Date  . Anxiety   . Anxiety   . Arthritis    cerv. stenosis, spondylosis, HNP- lower back , has been followed in pain clinic, has  had injection s in cerv. area  . Blood dyscrasia    told that when he was young he was a" free bleeder"  . CAD (coronary artery disease)   . Cervical spondylosis without myelopathy 07/24/2014  . Cervicogenic headache 07/24/2014  . Chronic kidney disease    renal calculi- passed spontaneously  . Depression   . Diabetes mellitus without complication ([place])   . Fatty liver   . GERD (gastroesophageal reflux disease)   . Headache(784.0)   . Hypertension   . Mental disorder   . MI, old   . RLS (restless legs syndrome)    detected on sleep study  . Shortness of breath     Patient Active Problem List   Diagnosis Date Noted  . Generalized anxiety disorder 08/11/2015  .  Major depressive disorder, recurrent episode, moderate with anxious distress ([place]) 06/21/2015  . NSTEMI (non-ST elevated myocardial infarction) ([place]) 06/20/2015  . STEMI (ST elevation myocardial infarction) ([place]) 01/01/2015  . Acute MI, inferoposterior wall ([place]) 01/01/2015  . Common bile duct dilation 10/06/2014  . Right lower quadrant abdominal pain 10/06/2014  . Loss of weight 10/06/2014  . Cervicogenic headache 07/24/2014  . Cervical spondylosis without myelopathy 07/24/2014  . Clinical depression 04/15/2013  . Acid reflux 04/15/2013  . BP (high blood pressure) 04/15/2013  . Encounter for other preprocedural examination 04/15/2013  . Nasal obstruction 02/12/2013  . Inflamed nasal mucosa 02/12/2013  . Sinus infection 02/12/2013  . Gastric catarrh 03/01/2011  . Allergic rhinitis 10/20/2010  . Airway hyperreactivity 10/20/2010  . Anxiety, generalized 10/20/2010  . Cervical pain 10/20/2010  . Current tobacco use 10/20/2010    Past Surgical History:  Procedure Laterality Date  . ANTERIOR CERVICAL DECOMP/DISCECTOMY FUSION  11/13/2011   Procedure: ANTERIOR CERVICAL DECOMPRESSION/DISCECTOMY FUSION 1 LEVEL;  Surgeon: [name], MD;  Location: [facility];  Service: Neurosurgery;  Laterality: N/A;  Anterior Cervical Decompression/discectomy Fusion. Cervical three-four.  Marland Kitchen CARDIAC CATHETERIZATION N/A 01/01/2015   Procedure: Left Heart Cath and Coronary Angiography;  Surgeon: [name], MD;  Location: [facility];  Service: Cardiovascular;  Laterality: N/A;  . NASAL SINUS SURGERY     2005    Prior to Admission medications   Medication Sig Start  Date End Date Taking? Authorizing Provider  atorvastatin (LIPITOR) 80 MG tablet Take 1 tablet (80 mg total) by mouth daily at 6 PM. 06/21/15  Yes Sital Mody, MD  clonazePAM (KLONOPIN) 1 MG tablet Take 1 mg by mouth 3 (three) times daily.    Yes Historical Provider, MD  esomeprazole (NEXIUM) 40 MG capsule Take 40 mg by mouth daily at 12  noon.   Yes Historical Provider, MD  lisinopril (PRINIVIL,ZESTRIL) 5 MG tablet Take 1 tablet (5 mg total) by mouth daily. 06/21/15  Yes Bettey Costa, MD  mirtazapine (REMERON) 45 MG tablet Take 1 tablet (45 mg total) by mouth at bedtime. Patient taking differently: Take 30 mg by mouth at bedtime.  08/11/15  Yes Gonzella Lex, MD  prasugrel (EFFIENT) 10 MG TABS tablet Take 10 mg by mouth daily.   Yes Historical Provider, MD  propranolol (INDERAL) 10 MG tablet Take 10 mg by mouth 2 (two) times daily.   Yes Historical Provider, MD  vortioxetine HBr (TRINTELLIX) 10 MG TABS Take 1 tablet by mouth daily.   Yes Historical Provider, MD  dexlansoprazole (DEXILANT) 60 MG capsule Take 60 mg by mouth daily.    Historical Provider, MD  doxepin (SINEQUAN) 50 MG capsule Take 50 mg by mouth at bedtime as needed.    Historical Provider, MD  glimepiride (AMARYL) 1 MG tablet Take 1 tablet (1 mg total) by mouth every morning. Patient not taking: Reported on 11/24/2015 09/04/15 09/03/16  Delman Kitten, MD  LORazepam (ATIVAN) 1 MG tablet Take 1 mg by mouth 3 (three) times daily.    Historical Provider, MD  nitroGLYCERIN (NITROSTAT) 0.4 MG SL tablet Place 1 tablet (0.4 mg total) under the tongue every 5 (five) minutes x 3 doses as needed for chest pain. 06/21/15   Sital Mody, MD  ondansetron (ZOFRAN ODT) 4 MG disintegrating tablet Take 1 tablet (4 mg total) by mouth every 6 (six) hours as needed for nausea or vomiting. Patient not taking: Reported on 11/24/2015 09/04/15   Delman Kitten, MD  QUEtiapine (SEROQUEL) 100 MG tablet Take 100 mg by mouth at bedtime.    Historical Provider, MD    Allergies Prednisone; Hydrocodone; and Varenicline  Family History  Problem Relation Age of Onset  . Prostate cancer Father   . Depression Father   . Hypertension Mother   . Kidney Stones Mother   . Anxiety disorder Mother   . Depression Mother   . COPD Sister   . Hypertension Sister   . Diabetes Sister   . Depression Sister   . Anxiety  disorder Sister   . Seizures Sister     Social History Social History  Substance Use Topics  . Smoking status: Current Every Day Smoker    Packs/day: 1.50    Years: 17.00    Types: Cigarettes  . Smokeless tobacco: Never Used  . Alcohol use 7.2 oz/week    12 Cans of beer per week     Comment: beer- 1-2 x's per month     Review of Systems Constitutional: No fever/chills Eyes: No visual changes. ENT: No sore throat. Cardiovascular: +chest pain. Respiratory: Denies shortness of breath. Gastrointestinal: No abdominal pain.  No nausea, no vomiting.  No diarrhea.  No constipation. Genitourinary: Negative for dysuria. Musculoskeletal: Negative for back pain. Skin: Negative for rash. Neurological: Negative for headaches, focal weakness or numbness.  10-point ROS otherwise negative.  ____________________________________________   PHYSICAL EXAM:  VITAL SIGNS: ED Triage Vitals [11/24/15 0918]  Enc Vitals Group  BP 127/80     Pulse Rate 82     Resp 17     Temp 98.4 F (36.9 C)     Temp Source Oral     SpO2 97 %     Weight 180 lb (81.6 kg)     Height 6' (1.829 m)     Head Circumference      Peak Flow      Pain Score 7     Pain Loc      Pain Edu?      Excl. in [place]?     Constitutional: Alert and oriented. Well appearing and in no acute distress. Eyes: Conjunctivae are normal. PERRL. EOMI. Head: Atraumatic. Nose: No congestion/rhinnorhea. Mouth/Throat: Mucous membranes are moist.  Oropharynx non-erythematous. Neck: No stridor.  No meningeal signs.   Cardiovascular: Normal rate, regular rhythm. Good peripheral circulation. Grossly normal heart sounds. Respiratory: Normal respiratory effort.  No retractions. Lungs CTAB. Gastrointestinal: Soft and nontender. No distention.  Musculoskeletal: No lower extremity tenderness nor edema. No gross deformities of extremities. Neurologic:  Normal speech and language. No gross focal neurologic deficits are appreciated.  Skin:   Skin is warm, dry and intact. No rash noted. Psychiatric: Mood and affect are normal. Speech and behavior are normal.  ____________________________________________   LABS (all labs ordered are listed, but only abnormal results are displayed)  Labs Reviewed  COMPREHENSIVE METABOLIC PANEL  TROPONIN I   ____________________________________________  EKG  ED ECG REPORT I, Jozeph Persing, the attending physician, personally viewed and interpreted this ECG.  Date: 11/24/2015 EKG Time: 09:24 Rate: 81 Rhythm: normal sinus rhythm QRS Axis: normal Intervals: normal ST/T Wave abnormalities: normal Conduction Disturbances: none Narrative Interpretation: unremarkable  ____________________________________________  RADIOLOGY   Dg Chest 2 View  Result Date: 11/24/2015 CLINICAL DATA:  Chest pain for 1 day EXAM: CHEST  2 VIEW COMPARISON:  October 03, 2015 FINDINGS: There is no edema or consolidation. The heart size and pulmonary vascularity are normal. No adenopathy. No pneumothorax. No bone lesions. IMPRESSION: No edema or consolidation. Electronically Signed   By: Lowella Grip III M.D.   On: 11/24/2015 09:50    ____________________________________________   PROCEDURES  Procedure(s) performed:   Procedures   Critical Care performed: No ____________________________________________   INITIAL IMPRESSION / ASSESSMENT AND PLAN / ED COURSE  Pertinent labs & imaging results that were available during my care of the patient were reviewed by me and considered in my medical decision making (see chart for details).  The patient's symptoms seem to mostly stemming from his concern over almost running out of his benzodiazepines and reportedly not having transportation to his psychiatrist.  I explained that according to the [facility] I cannot prescribe additional benzodiazepines and that he should have her narcotics at this time.  He did ask several times if there is any pain  medicine I can give him and I gave him a full dose aspirin given the cardiac benefit aspirin has been shown to have.  I encouraged him to follow up as an outpatient.  He states that he used to go to a pain management doctor but he "cussed him out because he lied to me", and I provided the name and number of the pain clinic so that he can follow-up.  I gave my usual and customary return precautions.   ____________________________________________  FINAL CLINICAL IMPRESSION(S) / ED DIAGNOSES  Final diagnoses:  Atypical chest pain     MEDICATIONS GIVEN DURING THIS VISIT:  Medications  aspirin  chewable tablet 324 mg (324 mg Oral Given 11/24/15 1225)     NEW OUTPATIENT MEDICATIONS STARTED DURING THIS VISIT:  New Prescriptions   No medications on file      Note:  This document was prepared using Dragon voice recognition software and may include unintentional dictation errors.    Hinda Kehr, MD 11/24/15 1259

## 2015-12-01 ENCOUNTER — Telehealth: Payer: Self-pay

## 2015-12-01 NOTE — Telephone Encounter (Signed)
lmov on for patient to call back and schedule ED fu from 11/24/15

## 2015-12-06 ENCOUNTER — Encounter: Payer: Self-pay | Admitting: Emergency Medicine

## 2015-12-06 ENCOUNTER — Emergency Department
Admission: EM | Admit: 2015-12-06 | Discharge: 2015-12-06 | Disposition: A | Discharge status: Home / Self Care | Payer: Medicare Other | Attending: Emergency Medicine | Admitting: Emergency Medicine

## 2015-12-06 DIAGNOSIS — R112 Nausea with vomiting, unspecified: Secondary | ICD-10-CM | POA: Insufficient documentation

## 2015-12-06 DIAGNOSIS — I2510 Atherosclerotic heart disease of native coronary artery without angina pectoris: Secondary | ICD-10-CM | POA: Diagnosis not present

## 2015-12-06 DIAGNOSIS — R000 Tachycardia, unspecified: Secondary | ICD-10-CM | POA: Diagnosis not present

## 2015-12-06 DIAGNOSIS — Z79899 Other long term (current) drug therapy: Secondary | ICD-10-CM | POA: Diagnosis not present

## 2015-12-06 DIAGNOSIS — E860 Dehydration: Secondary | ICD-10-CM | POA: Insufficient documentation

## 2015-12-06 DIAGNOSIS — R197 Diarrhea, unspecified: Secondary | ICD-10-CM

## 2015-12-06 DIAGNOSIS — I252 Old myocardial infarction: Secondary | ICD-10-CM | POA: Diagnosis not present

## 2015-12-06 DIAGNOSIS — I129 Hypertensive chronic kidney disease with stage 1 through stage 4 chronic kidney disease, or unspecified chronic kidney disease: Secondary | ICD-10-CM | POA: Diagnosis not present

## 2015-12-06 DIAGNOSIS — N189 Chronic kidney disease, unspecified: Secondary | ICD-10-CM | POA: Insufficient documentation

## 2015-12-06 DIAGNOSIS — F17210 Nicotine dependence, cigarettes, uncomplicated: Secondary | ICD-10-CM | POA: Insufficient documentation

## 2015-12-06 DIAGNOSIS — E1122 Type 2 diabetes mellitus with diabetic chronic kidney disease: Secondary | ICD-10-CM | POA: Insufficient documentation

## 2015-12-06 LAB — COMPREHENSIVE METABOLIC PANEL
ALK PHOS: 116 U/L (ref 38–126)
ALT: 28 U/L (ref 17–63)
AST: 22 U/L (ref 15–41)
Albumin: 4.2 g/dL (ref 3.5–5.0)
Anion gap: 9 (ref 5–15)
BUN: 5 mg/dL — ABNORMAL LOW (ref 6–20)
CHLORIDE: 104 mmol/L (ref 101–111)
CO2: 21 mmol/L — AB (ref 22–32)
Calcium: 9.1 mg/dL (ref 8.9–10.3)
Creatinine, Ser: 0.89 mg/dL (ref 0.61–1.24)
GLUCOSE: 141 mg/dL — AB (ref 65–99)
Potassium: 4 mmol/L (ref 3.5–5.1)
SODIUM: 134 mmol/L — AB (ref 135–145)
TOTAL PROTEIN: 7.6 g/dL (ref 6.5–8.1)
Total Bilirubin: 0.6 mg/dL (ref 0.3–1.2)

## 2015-12-06 LAB — CBC
HEMATOCRIT: 51.3 % (ref 40.0–52.0)
HEMOGLOBIN: 17.6 g/dL (ref 13.0–18.0)
MCH: 31.5 pg (ref 26.0–34.0)
MCHC: 34.3 g/dL (ref 32.0–36.0)
MCV: 92 fL (ref 80.0–100.0)
Platelets: 259 10*3/uL (ref 150–440)
RBC: 5.57 MIL/uL (ref 4.40–5.90)
RDW: 13.9 % (ref 11.5–14.5)
WBC: 13.3 10*3/uL — ABNORMAL HIGH (ref 3.8–10.6)

## 2015-12-06 LAB — URINALYSIS COMPLETE WITH MICROSCOPIC (ARMC ONLY)
BACTERIA UA: NONE SEEN
Bilirubin Urine: NEGATIVE
GLUCOSE, UA: NEGATIVE mg/dL
HGB URINE DIPSTICK: NEGATIVE
Ketones, ur: NEGATIVE mg/dL
Leukocytes, UA: NEGATIVE
NITRITE: NEGATIVE
PROTEIN: NEGATIVE mg/dL
Specific Gravity, Urine: 1.005 (ref 1.005–1.030)
pH: 6 (ref 5.0–8.0)

## 2015-12-06 LAB — LIPASE, BLOOD: Lipase: 25 U/L (ref 11–51)

## 2015-12-06 MED ORDER — PROMETHAZINE HCL 25 MG/ML IJ SOLN
25.0000 mg | Freq: Once | INTRAMUSCULAR | Status: AC
  Administered 2015-12-06: 25 mg via INTRAVENOUS
  Filled 2015-12-06: qty 1

## 2015-12-06 MED ORDER — GI COCKTAIL ~~LOC~~
30.0000 mL | Freq: Once | ORAL | Status: AC
  Administered 2015-12-06: 30 mL via ORAL
  Filled 2015-12-06: qty 30

## 2015-12-06 MED ORDER — MORPHINE SULFATE (PF) 4 MG/ML IV SOLN
4.0000 mg | Freq: Once | INTRAVENOUS | Status: AC
  Administered 2015-12-06: 4 mg via INTRAVENOUS
  Filled 2015-12-06: qty 1

## 2015-12-06 MED ORDER — PROMETHAZINE HCL 12.5 MG PO TABS: 12.5000 mg | ORAL_TABLET | Freq: Four times a day (QID) | ORAL | 0 refills | Status: DC | PRN

## 2015-12-06 NOTE — ED Notes (Signed)
Pt states feeling better.  Pt in hallway.  No n/v/d  Pt alert.  Family with pt.

## 2015-12-06 NOTE — ED Triage Notes (Signed)
Pt to ed with c/o vomiting and diarrhea x 2 days.

## 2015-12-06 NOTE — ED Notes (Signed)
Pt reports he has ulcers in his mouth, abd pain with vomiting, headache, neck pain and feeling well for several days.  Pt in hallway  Family and md with pt.

## 2015-12-06 NOTE — ED Notes (Signed)
Iv started and meds given.  siderails up x 2.

## 2015-12-06 NOTE — Discharge Instructions (Signed)
Stay hydrated.   Take phenergan for nausea.   See your doctor and pain specialist.   Return to ER if you have worse vomiting, severe abdominal pain, fevers, dehydration

## 2015-12-06 NOTE — ED Provider Notes (Signed)
[facility] Provider Note   CSN: [id number] Arrival date & time: 12/06/15  1430     History   Chief Complaint Chief Complaint  Patient presents with  . Emesis  . Diarrhea    HPI Michael Schmitt is a 45 y.o. male hx of anxiety, CAD, CKD, Chronic pain syndrome, here presenting with vomiting, diarrhea, tachycardia, possible aphthous ulcers. Patient states that for the last 2 days she's been having some diarrhea and vomiting. Also noticed some ulcers in his mouth. Patient states that he has been having chronic neck pain but denies a chance to refill his narcotics with his pain doctor injure him. Patient was seen in the ER about 2 weeks ago for anxiety and eventually did see his doctor and had his Ativan refilled. Denies any sick contacts currently denies any fevers.     The history is provided by the patient.    Past Medical History:  Diagnosis Date  . Anxiety   . Anxiety   . Arthritis    cerv. stenosis, spondylosis, HNP- lower back , has been followed in pain clinic, has  had injection s in cerv. area  . Blood dyscrasia    told that when he was young he was a" free bleeder"  . CAD (coronary artery disease)   . Cervical spondylosis without myelopathy 07/24/2014  . Cervicogenic headache 07/24/2014  . Chronic kidney disease    renal calculi- passed spontaneously  . Depression   . Diabetes mellitus without complication ([place])   . Fatty liver   . GERD (gastroesophageal reflux disease)   . Headache(784.0)   . Hypertension   . Mental disorder   . MI, old   . RLS (restless legs syndrome)    detected on sleep study  . Shortness of breath     Patient Active Problem List   Diagnosis Date Noted  . Generalized anxiety disorder 08/11/2015  . Major depressive disorder, recurrent episode, moderate with anxious distress ([place]) 06/21/2015  . NSTEMI (non-ST elevated myocardial infarction) ([place]) 06/20/2015  . STEMI (ST elevation myocardial infarction) ([place]) 01/01/2015  .  Acute MI, inferoposterior wall ([place]) 01/01/2015  . Common bile duct dilation 10/06/2014  . Right lower quadrant abdominal pain 10/06/2014  . Loss of weight 10/06/2014  . Cervicogenic headache 07/24/2014  . Cervical spondylosis without myelopathy 07/24/2014  . Clinical depression 04/15/2013  . Acid reflux 04/15/2013  . BP (high blood pressure) 04/15/2013  . Encounter for other preprocedural examination 04/15/2013  . Nasal obstruction 02/12/2013  . Inflamed nasal mucosa 02/12/2013  . Sinus infection 02/12/2013  . Gastric catarrh 03/01/2011  . Allergic rhinitis 10/20/2010  . Airway hyperreactivity 10/20/2010  . Anxiety, generalized 10/20/2010  . Cervical pain 10/20/2010  . Current tobacco use 10/20/2010    Past Surgical History:  Procedure Laterality Date  . ANTERIOR CERVICAL DECOMP/DISCECTOMY FUSION  11/13/2011   Procedure: ANTERIOR CERVICAL DECOMPRESSION/DISCECTOMY FUSION 1 LEVEL;  Surgeon: [name], MD;  Location: [facility];  Service: Neurosurgery;  Laterality: N/A;  Anterior Cervical Decompression/discectomy Fusion. Cervical three-four.  Marland Kitchen CARDIAC CATHETERIZATION N/A 01/01/2015   Procedure: Left Heart Cath and Coronary Angiography;  Surgeon: [name], MD;  Location: [facility];  Service: Cardiovascular;  Laterality: N/A;  . NASAL SINUS SURGERY     2005       Home Medications    Prior to Admission medications   Medication Sig Start Date End Date Taking? Authorizing Provider  atorvastatin (LIPITOR) 80 MG tablet Take 1 tablet (80 mg  total) by mouth daily at 6 PM. 06/21/15   Bettey Costa, MD  clonazePAM (KLONOPIN) 1 MG tablet Take 1 mg by mouth 3 (three) times daily.     Historical Provider, MD  dexlansoprazole (DEXILANT) 60 MG capsule Take 60 mg by mouth daily.    Historical Provider, MD  doxepin (SINEQUAN) 50 MG capsule Take 50 mg by mouth at bedtime as needed.    Historical Provider, MD  esomeprazole (NEXIUM) 40 MG capsule Take 40 mg by mouth daily at 12 noon.     Historical Provider, MD  glimepiride (AMARYL) 1 MG tablet Take 1 tablet (1 mg total) by mouth every morning. Patient not taking: Reported on 11/24/2015 09/04/15 09/03/16  Delman Kitten, MD  lisinopril (PRINIVIL,ZESTRIL) 5 MG tablet Take 1 tablet (5 mg total) by mouth daily. 06/21/15   Sital Mody, MD  LORazepam (ATIVAN) 1 MG tablet Take 1 mg by mouth 3 (three) times daily.    Historical Provider, MD  mirtazapine (REMERON) 45 MG tablet Take 1 tablet (45 mg total) by mouth at bedtime. Patient taking differently: Take 30 mg by mouth at bedtime.  08/11/15   Gonzella Lex, MD  nitroGLYCERIN (NITROSTAT) 0.4 MG SL tablet Place 1 tablet (0.4 mg total) under the tongue every 5 (five) minutes x 3 doses as needed for chest pain. 06/21/15   Sital Mody, MD  ondansetron (ZOFRAN ODT) 4 MG disintegrating tablet Take 1 tablet (4 mg total) by mouth every 6 (six) hours as needed for nausea or vomiting. Patient not taking: Reported on 11/24/2015 09/04/15   Delman Kitten, MD  prasugrel (EFFIENT) 10 MG TABS tablet Take 10 mg by mouth daily.    Historical Provider, MD  propranolol (INDERAL) 10 MG tablet Take 10 mg by mouth 2 (two) times daily.    Historical Provider, MD  QUEtiapine (SEROQUEL) 100 MG tablet Take 100 mg by mouth at bedtime.    Historical Provider, MD  vortioxetine HBr (TRINTELLIX) 10 MG TABS Take 1 tablet by mouth daily.    Historical Provider, MD    Family History Family History  Problem Relation Age of Onset  . Prostate cancer Father   . Depression Father   . Hypertension Mother   . Kidney Stones Mother   . Anxiety disorder Mother   . Depression Mother   . COPD Sister   . Hypertension Sister   . Diabetes Sister   . Depression Sister   . Anxiety disorder Sister   . Seizures Sister     Social History Social History  Substance Use Topics  . Smoking status: Current Every Day Smoker    Packs/day: 1.50    Years: 17.00    Types: Cigarettes  . Smokeless tobacco: Never Used  . Alcohol use 7.2 oz/week      12 Cans of beer per week     Comment: beer- 1-2 x's per month      Allergies   Prednisone; Hydrocodone; and Varenicline   Review of Systems Review of Systems  Gastrointestinal: Positive for diarrhea and vomiting.  All other systems reviewed and are negative.    Physical Exam Updated Vital Signs BP 118/84 (BP Location: Right Arm)   Pulse (!) 102   Temp 98.3 F (36.8 C) (Oral)   Resp 18   Ht 6\' 2"  (1.88 m)   Wt 185 lb (83.9 kg)   SpO2 96%   BMI 23.75 kg/m   Physical Exam  Constitutional:  Anxious   HENT:  Head: Normocephalic.  MM slightly  dry. Some small aphthous ulcers on tongue and cheek   Eyes: EOM are normal. Pupils are equal, round, and reactive to light.  Neck: Normal range of motion. Neck supple.  Cardiovascular: Regular rhythm.   Slightly tachy   Pulmonary/Chest: Effort normal and breath sounds normal.  Abdominal: Soft. Bowel sounds are normal.  Mild diffuse tenderness, no rebound. When he is distracted, nontender    Musculoskeletal: Normal range of motion.  Neurological: He is alert.  Skin: Skin is warm.  Psychiatric: He has a normal mood and affect.  Nursing note and vitals reviewed.    ED Treatments / Results  Labs (all labs ordered are listed, but only abnormal results are displayed) Labs Reviewed  COMPREHENSIVE METABOLIC PANEL - Abnormal; Notable for the following:       Result Value   Sodium 134 (*)    CO2 21 (*)    Glucose, Bld 141 (*)    BUN 5 (*)    All other components within normal limits  CBC - Abnormal; Notable for the following:    WBC 13.3 (*)    All other components within normal limits  URINALYSIS COMPLETEWITH MICROSCOPIC ([facility] ONLY) - Abnormal; Notable for the following:    Color, Urine YELLOW (*)    APPearance CLEAR (*)    Squamous Epithelial / LPF 0-5 (*)    All other components within normal limits  LIPASE, BLOOD    EKG  EKG Interpretation None       Radiology No results found.  Procedures Procedures  (including critical care time)  Medications Ordered in ED Medications  promethazine (PHENERGAN) injection 25 mg (25 mg Intravenous Given 12/06/15 1721)  morphine 4 MG/ML injection 4 mg (4 mg Intravenous Given 12/06/15 1721)  gi cocktail (Maalox,Lidocaine,Donnatal) (30 mLs Oral Given 12/06/15 1721)     Initial Impression / Assessment and Plan / ED Course  I have reviewed the triage vital signs and the nursing notes.  Pertinent labs & imaging results that were available during my care of the patient were reviewed by me and considered in my medical decision making (see chart for details).  Clinical Course    Michael Schmitt is a 45 y.o. male here with vomiting, diarrhea. Also has aphthous ulcers. Slightly tachy likely from dehydration and anxiety. Will give IVF, antiemetics. Will check labs and reassess.   6:21 PM Labs showed bicarb 21, nl AG. UA nl. Given IVF and phenergan and GI cocktail and felt better. HR down to 102 from 122. Will dc home with phenergan prn, nystatin for cold sores.    Final Clinical Impressions(s) / ED Diagnoses   Final diagnoses:  None    New Prescriptions New Prescriptions   No medications on file     Drenda Freeze, MD 12/06/15 1821

## 2015-12-07 NOTE — Telephone Encounter (Signed)
Called patient to make ED appointment  Pt states he would call us back to schedule appointment States his sister (who was sleeping at the time) is his ride and he would like to make sure when he can come based on her times available

## 2015-12-08 ENCOUNTER — Ambulatory Visit: Payer: Medicare Other | Admitting: Cardiology

## 2015-12-11 ENCOUNTER — Emergency Department
Admission: EM | Admit: 2015-12-11 | Discharge: 2015-12-11 | Disposition: A | Discharge status: Home / Self Care | Payer: Medicare Other | Attending: Emergency Medicine | Admitting: Emergency Medicine

## 2015-12-11 DIAGNOSIS — Z7984 Long term (current) use of oral hypoglycemic drugs: Secondary | ICD-10-CM | POA: Diagnosis not present

## 2015-12-11 DIAGNOSIS — I129 Hypertensive chronic kidney disease with stage 1 through stage 4 chronic kidney disease, or unspecified chronic kidney disease: Secondary | ICD-10-CM | POA: Insufficient documentation

## 2015-12-11 DIAGNOSIS — Z5321 Procedure and treatment not carried out due to patient leaving prior to being seen by health care provider: Secondary | ICD-10-CM | POA: Diagnosis not present

## 2015-12-11 DIAGNOSIS — F17210 Nicotine dependence, cigarettes, uncomplicated: Secondary | ICD-10-CM | POA: Diagnosis not present

## 2015-12-11 DIAGNOSIS — I2510 Atherosclerotic heart disease of native coronary artery without angina pectoris: Secondary | ICD-10-CM | POA: Insufficient documentation

## 2015-12-11 DIAGNOSIS — N189 Chronic kidney disease, unspecified: Secondary | ICD-10-CM | POA: Insufficient documentation

## 2015-12-11 DIAGNOSIS — R42 Dizziness and giddiness: Secondary | ICD-10-CM | POA: Diagnosis not present

## 2015-12-11 DIAGNOSIS — E1122 Type 2 diabetes mellitus with diabetic chronic kidney disease: Secondary | ICD-10-CM | POA: Diagnosis not present

## 2015-12-11 LAB — BASIC METABOLIC PANEL
ANION GAP: 8 (ref 5–15)
BUN: 10 mg/dL (ref 6–20)
CALCIUM: 9 mg/dL (ref 8.9–10.3)
CO2: 25 mmol/L (ref 22–32)
Chloride: 104 mmol/L (ref 101–111)
Creatinine, Ser: 0.91 mg/dL (ref 0.61–1.24)
Glucose, Bld: 100 mg/dL — ABNORMAL HIGH (ref 65–99)
POTASSIUM: 4.5 mmol/L (ref 3.5–5.1)
Sodium: 137 mmol/L (ref 135–145)

## 2015-12-11 LAB — URINALYSIS COMPLETE WITH MICROSCOPIC (ARMC ONLY)
BILIRUBIN URINE: NEGATIVE
Bacteria, UA: NONE SEEN
Glucose, UA: NEGATIVE mg/dL
Hgb urine dipstick: NEGATIVE
KETONES UR: NEGATIVE mg/dL
LEUKOCYTES UA: NEGATIVE
NITRITE: NEGATIVE
PH: 5 (ref 5.0–8.0)
Protein, ur: NEGATIVE mg/dL
SPECIFIC GRAVITY, URINE: 1.016 (ref 1.005–1.030)
Squamous Epithelial / LPF: NONE SEEN

## 2015-12-11 LAB — CBC
HEMATOCRIT: 46.3 % (ref 40.0–52.0)
HEMOGLOBIN: 16.3 g/dL (ref 13.0–18.0)
MCH: 32.5 pg (ref 26.0–34.0)
MCHC: 35.2 g/dL (ref 32.0–36.0)
MCV: 92.2 fL (ref 80.0–100.0)
Platelets: 187 10*3/uL (ref 150–440)
RBC: 5.02 MIL/uL (ref 4.40–5.90)
RDW: 14.4 % (ref 11.5–14.5)
WBC: 7.7 10*3/uL (ref 3.8–10.6)

## 2015-12-11 LAB — GLUCOSE, CAPILLARY: Glucose-Capillary: 116 mg/dL — ABNORMAL HIGH (ref 65–99)

## 2015-12-11 LAB — TROPONIN I: Troponin I: <0.03 ng/mL (ref <0.03)

## 2015-12-11 NOTE — ED Triage Notes (Signed)
BIB EMS for dizziness. States he stood up once today and fell caught himself on his hands denies injury. Pt states Monday he was diagnosed with the stomach flu, States he has not felt better since constantly nauseated. Denies vomiting, diarrhea or fever. Pt states he was started on a new mediation for anxiety that he is scheduled to take at bedtime. States that he took the medication again today by accident and had felt lightheaded every since. Denies SI/HI

## 2015-12-11 NOTE — ED Notes (Signed)
Called for room, no in waiting room.

## 2015-12-13 ENCOUNTER — Emergency Department
Admission: EM | Admit: 2015-12-13 | Discharge: 2015-12-13 | Disposition: A | Discharge status: Home / Self Care | Payer: Medicare Other | Attending: Emergency Medicine | Admitting: Emergency Medicine

## 2015-12-13 ENCOUNTER — Encounter: Payer: Self-pay | Admitting: Medical Oncology

## 2015-12-13 DIAGNOSIS — E119 Type 2 diabetes mellitus without complications: Secondary | ICD-10-CM | POA: Insufficient documentation

## 2015-12-13 DIAGNOSIS — E1122 Type 2 diabetes mellitus with diabetic chronic kidney disease: Secondary | ICD-10-CM | POA: Diagnosis not present

## 2015-12-13 DIAGNOSIS — T887XXA Unspecified adverse effect of drug or medicament, initial encounter: Secondary | ICD-10-CM | POA: Diagnosis not present

## 2015-12-13 DIAGNOSIS — N189 Chronic kidney disease, unspecified: Secondary | ICD-10-CM | POA: Diagnosis not present

## 2015-12-13 DIAGNOSIS — Z7984 Long term (current) use of oral hypoglycemic drugs: Secondary | ICD-10-CM | POA: Diagnosis not present

## 2015-12-13 DIAGNOSIS — Z79899 Other long term (current) drug therapy: Secondary | ICD-10-CM | POA: Insufficient documentation

## 2015-12-13 DIAGNOSIS — F17210 Nicotine dependence, cigarettes, uncomplicated: Secondary | ICD-10-CM | POA: Insufficient documentation

## 2015-12-13 DIAGNOSIS — I252 Old myocardial infarction: Secondary | ICD-10-CM | POA: Insufficient documentation

## 2015-12-13 DIAGNOSIS — T50995A Adverse effect of other drugs, medicaments and biological substances, initial encounter: Secondary | ICD-10-CM | POA: Diagnosis not present

## 2015-12-13 DIAGNOSIS — Y69 Unspecified misadventure during surgical and medical care: Secondary | ICD-10-CM | POA: Diagnosis not present

## 2015-12-13 DIAGNOSIS — M791 Myalgia: Secondary | ICD-10-CM | POA: Diagnosis not present

## 2015-12-13 DIAGNOSIS — I2510 Atherosclerotic heart disease of native coronary artery without angina pectoris: Secondary | ICD-10-CM | POA: Diagnosis not present

## 2015-12-13 DIAGNOSIS — I129 Hypertensive chronic kidney disease with stage 1 through stage 4 chronic kidney disease, or unspecified chronic kidney disease: Secondary | ICD-10-CM | POA: Insufficient documentation

## 2015-12-13 DIAGNOSIS — Z5321 Procedure and treatment not carried out due to patient leaving prior to being seen by health care provider: Secondary | ICD-10-CM | POA: Insufficient documentation

## 2015-12-13 DIAGNOSIS — T50905A Adverse effect of unspecified drugs, medicaments and biological substances, initial encounter: Secondary | ICD-10-CM

## 2015-12-13 DIAGNOSIS — R42 Dizziness and giddiness: Secondary | ICD-10-CM | POA: Diagnosis present

## 2015-12-13 LAB — CBC
HCT: 47.2 % (ref 40.0–52.0)
Hemoglobin: 16.3 g/dL (ref 13.0–18.0)
MCH: 31.6 pg (ref 26.0–34.0)
MCHC: 34.5 g/dL (ref 32.0–36.0)
MCV: 91.6 fL (ref 80.0–100.0)
PLATELETS: 189 10*3/uL (ref 150–440)
RBC: 5.16 MIL/uL (ref 4.40–5.90)
RDW: 13.9 % (ref 11.5–14.5)
WBC: 9.2 10*3/uL (ref 3.8–10.6)

## 2015-12-13 LAB — COMPREHENSIVE METABOLIC PANEL
ALT: 30 U/L (ref 17–63)
AST: 32 U/L (ref 15–41)
Albumin: 4.1 g/dL (ref 3.5–5.0)
Alkaline Phosphatase: 84 U/L (ref 38–126)
Anion gap: 8 (ref 5–15)
BUN: 12 mg/dL (ref 6–20)
CHLORIDE: 103 mmol/L (ref 101–111)
CO2: 24 mmol/L (ref 22–32)
CREATININE: 0.86 mg/dL (ref 0.61–1.24)
Calcium: 8.9 mg/dL (ref 8.9–10.3)
GFR calc Af Amer: >60 mL/min (ref >60)
GFR calc non Af Amer: >60 mL/min (ref >60)
Glucose, Bld: 127 mg/dL — ABNORMAL HIGH (ref 65–99)
POTASSIUM: 4.4 mmol/L (ref 3.5–5.1)
SODIUM: 135 mmol/L (ref 135–145)
Total Bilirubin: 0.4 mg/dL (ref 0.3–1.2)
Total Protein: 7.2 g/dL (ref 6.5–8.1)

## 2015-12-13 MED ORDER — SODIUM CHLORIDE 0.9 % IV BOLUS (SEPSIS)
1000.0000 mL | Freq: Once | INTRAVENOUS | Status: AC
  Administered 2015-12-13: 1000 mL via INTRAVENOUS

## 2015-12-13 MED ORDER — HYDROXYZINE HCL 25 MG PO TABS
25.0000 mg | ORAL_TABLET | Freq: Once | ORAL | Status: AC
  Administered 2015-12-13: 25 mg via ORAL
  Filled 2015-12-13: qty 1

## 2015-12-13 MED ORDER — HYDROXYZINE HCL 25 MG PO TABS: 25.0000 mg | ORAL_TABLET | Freq: Three times a day (TID) | ORAL | 0 refills | Status: DC | PRN

## 2015-12-13 NOTE — ED Notes (Signed)
Pt called to room without answer.  

## 2015-12-13 NOTE — ED Triage Notes (Signed)
Pt discharged from ER earlier today. Still c/o headache and panic attacks at home. Pt states he was prescribed vistaril and needs to be prescribed something else. Pt alert and oriented X4, active, cooperative, pt in NAD. RR even and unlabored, color WNL.

## 2015-12-13 NOTE — ED Notes (Signed)
No answer when called from lobby 

## 2015-12-13 NOTE — ED Provider Notes (Signed)
Medical screening exam: I have evaluated the patient in the ER triage room #1. Patient states he's run out of his benzodiazepines and feels bad, having tremor and muscle aches. Patient was just seen in the ER earlier today and was discharged with withdrawal precautions. Patient was not given prescriptions for controlled substance. I do not fill, will writing for controlled substances from patient. Patient clearly seems to be in some type of medication withdrawal. Medically he seems stable at this time.   Earleen Newport, MD 12/13/15 [phone]

## 2015-12-13 NOTE — ED Provider Notes (Signed)
[facility] Emergency Department Provider Note  Time seen: 8:33 AM  I have reviewed the triage vital signs and the nursing notes.   HISTORY  Chief Complaint Shortness of Breath; Dizziness; Headache; and Chest Pain    HPI Michael Schmitt is a 45 y.o. male with a past medical history of anxiety, CK D, diabetes, who presents the emergency department not feeling right. According to the patient over the past 2-3 weeks he has gone through multiple medication changes by his psychiatrist for his anxiety. States his psychiatrist took them off Klonopin, which she has been on for 10 years and placed him on Ativan every 8 hours. Patient states he was needing to take 4 tablets per day and has run out of Ativan. He called his psychiatrist but he cannot get any more Ativan for 8 days. He states his psychiatrist called in a new medication for him, he does not recall the name of this medication, but states since taking it he has felt very "loopy" and out of it. Patient states he did not take the medication today. Patient denies any acute medical concerns. Denies any chest pain or abdominal pain. States he has been feeling somewhat nauseated.  Past Medical History:  Diagnosis Date  . Anxiety   . Anxiety   . Arthritis    cerv. stenosis, spondylosis, HNP- lower back , has been followed in pain clinic, has  had injection s in cerv. area  . Blood dyscrasia    told that when he was young he was a" free bleeder"  . CAD (coronary artery disease)   . Cervical spondylosis without myelopathy 07/24/2014  . Cervicogenic headache 07/24/2014  . Chronic kidney disease    renal calculi- passed spontaneously  . Depression   . Diabetes mellitus without complication ([place])   . Fatty liver   . GERD (gastroesophageal reflux disease)   . Headache(784.0)   . Hypertension   . Mental disorder   . MI, old   . RLS (restless legs syndrome)    detected on sleep study  . Shortness of breath      Patient Active Problem List   Diagnosis Date Noted  . Generalized anxiety disorder 08/11/2015  . Major depressive disorder, recurrent episode, moderate with anxious distress ([place]) 06/21/2015  . NSTEMI (non-ST elevated myocardial infarction) ([place]) 06/20/2015  . STEMI (ST elevation myocardial infarction) ([place]) 01/01/2015  . Acute MI, inferoposterior wall ([place]) 01/01/2015  . Common bile duct dilation 10/06/2014  . Right lower quadrant abdominal pain 10/06/2014  . Loss of weight 10/06/2014  . Cervicogenic headache 07/24/2014  . Cervical spondylosis without myelopathy 07/24/2014  . Clinical depression 04/15/2013  . Acid reflux 04/15/2013  . BP (high blood pressure) 04/15/2013  . Encounter for other preprocedural examination 04/15/2013  . Nasal obstruction 02/12/2013  . Inflamed nasal mucosa 02/12/2013  . Sinus infection 02/12/2013  . Gastric catarrh 03/01/2011  . Allergic rhinitis 10/20/2010  . Airway hyperreactivity 10/20/2010  . Anxiety, generalized 10/20/2010  . Cervical pain 10/20/2010  . Current tobacco use 10/20/2010    Past Surgical History:  Procedure Laterality Date  . ANTERIOR CERVICAL DECOMP/DISCECTOMY FUSION  11/13/2011   Procedure: ANTERIOR CERVICAL DECOMPRESSION/DISCECTOMY FUSION 1 LEVEL;  Surgeon: [name], MD;  Location: [place] [facility];  Service: Neurosurgery;  Laterality: N/A;  Anterior Cervical Decompression/discectomy Fusion. Cervical three-four.  Marland Kitchen CARDIAC CATHETERIZATION N/A 01/01/2015   Procedure: Left Heart Cath and Coronary Angiography;  Surgeon: [name], MD;  Location: [place] [facility];  Service: Cardiovascular;  Laterality: N/A;  . NASAL SINUS SURGERY     2005    Prior to Admission medications   Medication Sig Start Date End Date Taking? Authorizing Provider  atorvastatin (LIPITOR) 80 MG tablet Take 1 tablet (80 mg total) by mouth daily at 6 PM. 06/21/15   Bettey Costa, MD  clonazePAM (KLONOPIN) 1 MG tablet Take 1 mg by mouth 3 (three) times  daily.     Historical Provider, MD  dexlansoprazole (DEXILANT) 60 MG capsule Take 60 mg by mouth daily.    Historical Provider, MD  doxepin (SINEQUAN) 50 MG capsule Take 50 mg by mouth at bedtime as needed.    Historical Provider, MD  esomeprazole (NEXIUM) 40 MG capsule Take 40 mg by mouth daily at 12 noon.    Historical Provider, MD  glimepiride (AMARYL) 1 MG tablet Take 1 tablet (1 mg total) by mouth every morning. Patient not taking: Reported on 11/24/2015 09/04/15 09/03/16  Delman Kitten, MD  lisinopril (PRINIVIL,ZESTRIL) 5 MG tablet Take 1 tablet (5 mg total) by mouth daily. 06/21/15   Sital Mody, MD  LORazepam (ATIVAN) 1 MG tablet Take 1 mg by mouth 3 (three) times daily.    Historical Provider, MD  mirtazapine (REMERON) 45 MG tablet Take 1 tablet (45 mg total) by mouth at bedtime. Patient taking differently: Take 30 mg by mouth at bedtime.  08/11/15   Gonzella Lex, MD  nitroGLYCERIN (NITROSTAT) 0.4 MG SL tablet Place 1 tablet (0.4 mg total) under the tongue every 5 (five) minutes x 3 doses as needed for chest pain. 06/21/15   Sital Mody, MD  ondansetron (ZOFRAN ODT) 4 MG disintegrating tablet Take 1 tablet (4 mg total) by mouth every 6 (six) hours as needed for nausea or vomiting. Patient not taking: Reported on 11/24/2015 09/04/15   Delman Kitten, MD  prasugrel (EFFIENT) 10 MG TABS tablet Take 10 mg by mouth daily.    Historical Provider, MD  promethazine (PHENERGAN) 12.5 MG tablet Take 1 tablet (12.5 mg total) by mouth every 6 (six) hours as needed for nausea or vomiting. 12/06/15   Drenda Freeze, MD  propranolol (INDERAL) 10 MG tablet Take 10 mg by mouth 2 (two) times daily.    Historical Provider, MD  QUEtiapine (SEROQUEL) 100 MG tablet Take 100 mg by mouth at bedtime.    Historical Provider, MD  vortioxetine HBr (TRINTELLIX) 10 MG TABS Take 1 tablet by mouth daily.    Historical Provider, MD    Allergies  Allergen Reactions  . Prednisone Other (See Comments)    Hypertension, makes him feel  spacey  . Hydrocodone Other (See Comments)    Headache, irritable  . Varenicline Other (See Comments)    Suicidal thoughts    Family History  Problem Relation Age of Onset  . Prostate cancer Father   . Depression Father   . Hypertension Mother   . Kidney Stones Mother   . Anxiety disorder Mother   . Depression Mother   . COPD Sister   . Hypertension Sister   . Diabetes Sister   . Depression Sister   . Anxiety disorder Sister   . Seizures Sister     Social History Social History  Substance Use Topics  . Smoking status: Current Every Day Smoker    Packs/day: 1.50    Years: 17.00    Types: Cigarettes  . Smokeless tobacco: Never Used  . Alcohol use 7.2 oz/week    12 Cans of beer per week  Comment: beer- 1-2 x's per month     Review of Systems Constitutional: Negative for fever Cardiovascular: Negative for chest pain. Respiratory: Negative for shortness of breath. Gastrointestinal: Negative for abdominal pain Musculoskeletal: Negative for back pain. Neurological: Negative for headache 10-point ROS otherwise negative.  ____________________________________________   PHYSICAL EXAM:  VITAL SIGNS: ED Triage Vitals  Enc Vitals Group     BP 12/13/15 0808 (!) 135/91     Pulse Rate 12/13/15 0808 87     Resp 12/13/15 0808 18     Temp 12/13/15 0808 97.9 F (36.6 C)     Temp Source 12/13/15 0808 Oral     SpO2 12/13/15 0808 100 %     Weight 12/13/15 0802 187 lb (84.8 kg)     Height 12/13/15 0802 6\' 2"  (1.88 m)     Head Circumference --      Peak Flow --      Pain Score 12/13/15 0802 7     Pain Loc --      Pain Edu? --      Excl. in [place]? --     Constitutional: Alert and oriented. Well appearing and in no distress. Eyes: Normal exam ENT   Head: Normocephalic and atraumatic.   Mouth/Throat: Mucous membranes are moist. Cardiovascular: Normal rate, regular rhythm. No murmur Respiratory: Normal respiratory effort without tachypnea nor retractions. Breath  sounds are clear Gastrointestinal: Soft and nontender. No distention. Musculoskeletal: Nontender with normal range of motion in all extremities. Neurologic:  Normal speech and language. No gross focal neurologic deficits Skin:  Skin is warm, dry and intact.  Psychiatric: Mood and affect are normal.  ____________________________________________    EKG  EKG reviewed and interpreted by myself shows normal sinus rhythm at 90 bpm, narrow QRS, normal axis, normal intervals, no ST changes. Normal EKG.  ____________________________________________    INITIAL IMPRESSION / ASSESSMENT AND PLAN / ED COURSE  Pertinent labs & imaging results that were available during my care of the patient were reviewed by me and considered in my medical decision making (see chart for details).  The patient presents emergency department for symptoms of feeling "out of it" and loopy. Patient has undergone multiple medication changes over the past 2-3 weeks, has run out of Ativan. I strongly believe the patient's symptoms are likely medication induced. However I discussed with the patient that I cannot refill his controlled substance and he will need to speak to his psychiatrist to do so. We will check basic labs, IV hydrate, dose hydroxyzine in the emergency department, and continue to closely monitor.  Patient's labs are largely within normal limits. Patient is not complaining of pain and asking for something for pain, patient has a history of chronic pain. I discussed with the patient that with his history of chronic pain and did not feel comfortable writing him for narcotic pain medication. We will prescribe a short course of hydroxyzine and the patient is follow up with his doctor.  ____________________________________________   FINAL CLINICAL IMPRESSION(S) / ED DIAGNOSES  Medication reaction    Harvest Dark, MD 12/13/15 [phone]

## 2015-12-13 NOTE — Discharge Instructions (Signed)
Please call your psychiatrist today to discuss your symptoms and any medication changes to baseline medications.

## 2015-12-13 NOTE — ED Notes (Addendum)
Pt reports CP, dizziness that is causing him to fall and SOB since Saturday. Chronic neck pain 8/10. Pt started on a new medication by his psychiatrist Saturday, unsure if related. Pt also reports that he was taking Klonopin and is concerned with withdrawal from that. MD at bedside currently. Pt alert and oriented X4, active, cooperative, pt in NAD. RR even and unlabored, color WNL.

## 2015-12-13 NOTE — ED Notes (Addendum)
MD, Jimmye Norman- in triage room. Offering patient to be seen by psychiatry if he would like his Klonopin refilled. Pt states that he has to be seen now and cannot wait. Pt informed that there are no rooms available at this time and will have to wait for room. Pt back in lobby with wife.

## 2015-12-13 NOTE — ED Triage Notes (Signed)
Pt reports he was seen here Saturday for same thing but reports chest pain, sob, headache, and dizziness since he stopped taking one of his psych meds.

## 2015-12-28 DIAGNOSIS — I2584 Coronary atherosclerosis due to calcified coronary lesion: Secondary | ICD-10-CM | POA: Diagnosis not present

## 2015-12-28 DIAGNOSIS — E119 Type 2 diabetes mellitus without complications: Secondary | ICD-10-CM | POA: Diagnosis not present

## 2015-12-28 DIAGNOSIS — E291 Testicular hypofunction: Secondary | ICD-10-CM | POA: Diagnosis not present

## 2016-01-05 DIAGNOSIS — E782 Mixed hyperlipidemia: Secondary | ICD-10-CM | POA: Diagnosis not present

## 2016-01-05 DIAGNOSIS — I2510 Atherosclerotic heart disease of native coronary artery without angina pectoris: Secondary | ICD-10-CM | POA: Diagnosis not present

## 2016-01-05 DIAGNOSIS — R030 Elevated blood-pressure reading, without diagnosis of hypertension: Secondary | ICD-10-CM | POA: Diagnosis not present

## 2016-01-05 DIAGNOSIS — R0789 Other chest pain: Secondary | ICD-10-CM | POA: Diagnosis not present

## 2016-01-05 DIAGNOSIS — R079 Chest pain, unspecified: Secondary | ICD-10-CM | POA: Diagnosis not present

## 2016-01-05 DIAGNOSIS — I252 Old myocardial infarction: Secondary | ICD-10-CM | POA: Insufficient documentation

## 2016-01-11 DIAGNOSIS — N401 Enlarged prostate with lower urinary tract symptoms: Secondary | ICD-10-CM | POA: Diagnosis not present

## 2016-01-11 DIAGNOSIS — E538 Deficiency of other specified B group vitamins: Secondary | ICD-10-CM | POA: Diagnosis not present

## 2016-01-11 DIAGNOSIS — E291 Testicular hypofunction: Secondary | ICD-10-CM | POA: Diagnosis not present

## 2016-01-11 DIAGNOSIS — I10 Essential (primary) hypertension: Secondary | ICD-10-CM | POA: Diagnosis not present

## 2016-01-11 DIAGNOSIS — E785 Hyperlipidemia, unspecified: Secondary | ICD-10-CM | POA: Diagnosis not present

## 2016-01-11 DIAGNOSIS — E118 Type 2 diabetes mellitus with unspecified complications: Secondary | ICD-10-CM | POA: Diagnosis not present

## 2016-01-11 DIAGNOSIS — K760 Fatty (change of) liver, not elsewhere classified: Secondary | ICD-10-CM | POA: Diagnosis not present

## 2016-01-12 DIAGNOSIS — Z720 Tobacco use: Secondary | ICD-10-CM | POA: Diagnosis not present

## 2016-01-12 DIAGNOSIS — R030 Elevated blood-pressure reading, without diagnosis of hypertension: Secondary | ICD-10-CM | POA: Diagnosis not present

## 2016-01-12 DIAGNOSIS — R0789 Other chest pain: Secondary | ICD-10-CM | POA: Diagnosis not present

## 2016-01-12 DIAGNOSIS — I2510 Atherosclerotic heart disease of native coronary artery without angina pectoris: Secondary | ICD-10-CM | POA: Diagnosis not present

## 2016-01-12 DIAGNOSIS — E782 Mixed hyperlipidemia: Secondary | ICD-10-CM | POA: Diagnosis not present

## 2016-01-20 DIAGNOSIS — I10 Essential (primary) hypertension: Secondary | ICD-10-CM | POA: Diagnosis not present

## 2016-01-20 DIAGNOSIS — E785 Hyperlipidemia, unspecified: Secondary | ICD-10-CM | POA: Diagnosis not present

## 2016-01-20 DIAGNOSIS — E237 Disorder of pituitary gland, unspecified: Secondary | ICD-10-CM | POA: Diagnosis not present

## 2016-01-20 DIAGNOSIS — E291 Testicular hypofunction: Secondary | ICD-10-CM | POA: Diagnosis not present

## 2016-01-24 DIAGNOSIS — G894 Chronic pain syndrome: Secondary | ICD-10-CM | POA: Diagnosis not present

## 2016-01-24 DIAGNOSIS — M542 Cervicalgia: Secondary | ICD-10-CM | POA: Diagnosis not present

## 2016-01-24 DIAGNOSIS — Z79891 Long term (current) use of opiate analgesic: Secondary | ICD-10-CM | POA: Diagnosis not present

## 2016-01-24 DIAGNOSIS — M5412 Radiculopathy, cervical region: Secondary | ICD-10-CM | POA: Diagnosis not present

## 2016-01-24 DIAGNOSIS — M961 Postlaminectomy syndrome, not elsewhere classified: Secondary | ICD-10-CM | POA: Diagnosis not present

## 2016-03-11 NOTE — Progress Notes (Signed)
Patient came to the office seeking to begin treatment. We reviewed his plan. Reviewed past treatment. This patient has no insurance still. He has chronic anxiety and depression. He is currently on medicine prescribed outside.  Long-standing depression. No active suicidal intent or plan. No sign of psychosis.  Review treatment goals with the patient and possible medication changes were right now I am not able to start working with him because of his lack of insurance and inability to continue paying. He has outpatient treatment already in the community at the local clinics and should continue to follow up there. No need for any further appointments with me. I will not be able to do ECT which is his primary goal until he were to have insurance.

## 2016-03-26 ENCOUNTER — Encounter: Payer: Self-pay | Admitting: Emergency Medicine

## 2016-03-26 ENCOUNTER — Emergency Department: Payer: Medicare Other

## 2016-03-26 ENCOUNTER — Emergency Department
Admission: EM | Admit: 2016-03-26 | Discharge: 2016-03-26 | Disposition: A | Discharge status: Home / Self Care | Payer: Medicare Other | Attending: Emergency Medicine | Admitting: Emergency Medicine

## 2016-03-26 DIAGNOSIS — J069 Acute upper respiratory infection, unspecified: Secondary | ICD-10-CM

## 2016-03-26 DIAGNOSIS — R0602 Shortness of breath: Secondary | ICD-10-CM | POA: Diagnosis not present

## 2016-03-26 DIAGNOSIS — I129 Hypertensive chronic kidney disease with stage 1 through stage 4 chronic kidney disease, or unspecified chronic kidney disease: Secondary | ICD-10-CM | POA: Insufficient documentation

## 2016-03-26 DIAGNOSIS — R05 Cough: Secondary | ICD-10-CM | POA: Diagnosis not present

## 2016-03-26 DIAGNOSIS — F17210 Nicotine dependence, cigarettes, uncomplicated: Secondary | ICD-10-CM | POA: Insufficient documentation

## 2016-03-26 DIAGNOSIS — Z79899 Other long term (current) drug therapy: Secondary | ICD-10-CM | POA: Diagnosis not present

## 2016-03-26 DIAGNOSIS — J209 Acute bronchitis, unspecified: Secondary | ICD-10-CM | POA: Insufficient documentation

## 2016-03-26 DIAGNOSIS — F329 Major depressive disorder, single episode, unspecified: Secondary | ICD-10-CM

## 2016-03-26 DIAGNOSIS — F32A Depression, unspecified: Secondary | ICD-10-CM

## 2016-03-26 DIAGNOSIS — E1122 Type 2 diabetes mellitus with diabetic chronic kidney disease: Secondary | ICD-10-CM | POA: Insufficient documentation

## 2016-03-26 DIAGNOSIS — N189 Chronic kidney disease, unspecified: Secondary | ICD-10-CM | POA: Diagnosis not present

## 2016-03-26 DIAGNOSIS — I2510 Atherosclerotic heart disease of native coronary artery without angina pectoris: Secondary | ICD-10-CM | POA: Diagnosis not present

## 2016-03-26 LAB — CBC
HCT: 44.5 % (ref 40.0–52.0)
Hemoglobin: 15.1 g/dL (ref 13.0–18.0)
MCH: 31.6 pg (ref 26.0–34.0)
MCHC: 34 g/dL (ref 32.0–36.0)
MCV: 92.8 fL (ref 80.0–100.0)
PLATELETS: 199 10*3/uL (ref 150–440)
RBC: 4.79 MIL/uL (ref 4.40–5.90)
RDW: 14.3 % (ref 11.5–14.5)
WBC: 8.4 10*3/uL (ref 3.8–10.6)

## 2016-03-26 LAB — INFLUENZA PANEL BY PCR (TYPE A & B)
INFLAPCR: NEGATIVE
INFLBPCR: NEGATIVE

## 2016-03-26 LAB — COMPREHENSIVE METABOLIC PANEL
ALT: 22 U/L (ref 17–63)
AST: 26 U/L (ref 15–41)
Albumin: 3.8 g/dL (ref 3.5–5.0)
Alkaline Phosphatase: 77 U/L (ref 38–126)
Anion gap: 5 (ref 5–15)
BILIRUBIN TOTAL: 0.4 mg/dL (ref 0.3–1.2)
BUN: 12 mg/dL (ref 6–20)
CHLORIDE: 108 mmol/L (ref 101–111)
CO2: 27 mmol/L (ref 22–32)
CREATININE: 1.03 mg/dL (ref 0.61–1.24)
Calcium: 9.2 mg/dL (ref 8.9–10.3)
Glucose, Bld: 97 mg/dL (ref 65–99)
POTASSIUM: 4.3 mmol/L (ref 3.5–5.1)
Sodium: 140 mmol/L (ref 135–145)
TOTAL PROTEIN: 6.9 g/dL (ref 6.5–8.1)

## 2016-03-26 LAB — ETHANOL

## 2016-03-26 LAB — URINE DRUG SCREEN, QUALITATIVE (ARMC ONLY)
Amphetamines, Ur Screen: NOT DETECTED
BENZODIAZEPINE, UR SCRN: POSITIVE — AB
Barbiturates, Ur Screen: NOT DETECTED
CANNABINOID 50 NG, UR ~~LOC~~: NOT DETECTED
Cocaine Metabolite,Ur ~~LOC~~: NOT DETECTED
MDMA (Ecstasy)Ur Screen: NOT DETECTED
Methadone Scn, Ur: NOT DETECTED
Opiate, Ur Screen: NOT DETECTED
PHENCYCLIDINE (PCP) UR S: NOT DETECTED
TRICYCLIC, UR SCREEN: NOT DETECTED

## 2016-03-26 LAB — ACETAMINOPHEN LEVEL: Acetaminophen (Tylenol), Serum: <10 ug/mL — ABNORMAL LOW (ref 10–30)

## 2016-03-26 LAB — SALICYLATE LEVEL

## 2016-03-26 MED ORDER — OLANZAPINE 5 MG PO TABS: 5.0000 mg | ORAL_TABLET | Freq: Every day | ORAL | 0 refills | Status: DC

## 2016-03-26 MED ORDER — AMOXICILLIN-POT CLAVULANATE 875-125 MG PO TABS: 1.0000 | ORAL_TABLET | Freq: Two times a day (BID) | ORAL | 0 refills | Status: DC

## 2016-03-26 MED ORDER — FLUOXETINE HCL 20 MG PO CAPS
20.0000 mg | ORAL_CAPSULE | Freq: Every day | ORAL | 0 refills | Status: DC
Start: 2016-03-26 — End: 2016-03-26

## 2016-03-26 MED ORDER — CLONAZEPAM 1 MG PO TABS: 1.0000 mg | ORAL_TABLET | Freq: Three times a day (TID) | ORAL | 0 refills | Status: DC | PRN

## 2016-03-26 MED ORDER — FLUOXETINE HCL 20 MG PO CAPS: 20.0000 mg | ORAL_CAPSULE | Freq: Every day | ORAL | 0 refills | Status: DC

## 2016-03-26 MED ORDER — CLONAZEPAM 1 MG PO TABS: 1.0000 mg | ORAL_TABLET | Freq: Every day | ORAL | 0 refills | Status: DC

## 2016-03-26 NOTE — ED Provider Notes (Signed)
Patient sent to be evaluated by me given his complaints of SI. He tells me he would never hurt himself because he has two kids. He states he wants his mother to demonstrate affection towards him. He also is scared of going to jail for a dwi. Do not feel patient is an acute threat to himself. He agrees to stay and speak with psychiarty. Anticipate discharge.    Lavonia Drafts, MD 03/26/16 [phone]

## 2016-03-26 NOTE — ED Provider Notes (Signed)
[facility] Emergency Department Provider Note  ____________________________________________  Time seen: Approximately 10:50 AM  I have reviewed the triage vital signs and the nursing notes.   HISTORY  Chief Complaint Influenza    HPI Michael Schmitt is a 45 y.o. male presents for evaluation of a one-week history of cough congestion and body aches fever chills nausea vomiting. Patient states he took ibuprofen 800 mg this morning still feels better. In addition patient is feelings of hopelessness helplessness. States he received 2 nebulizers here lost his home is homeless and has a suicidal plan and intact.   Past Medical History:  Diagnosis Date  . Anxiety   . Anxiety   . Arthritis    cerv. stenosis, spondylosis, HNP- lower back , has been followed in pain clinic, has  had injection s in cerv. area  . Blood dyscrasia    told that when he was young he was a" free bleeder"  . CAD (coronary artery disease)   . Cervical spondylosis without myelopathy 07/24/2014  . Cervicogenic headache 07/24/2014  . Chronic kidney disease    renal calculi- passed spontaneously  . Depression   . Diabetes mellitus without complication ([place])   . Fatty liver   . GERD (gastroesophageal reflux disease)   . Headache(784.0)   . Hypertension   . Mental disorder   . MI, old   . RLS (restless legs syndrome)    detected on sleep study  . Shortness of breath     Patient Active Problem List   Diagnosis Date Noted  . Generalized anxiety disorder 08/11/2015  . Major depressive disorder, recurrent episode, moderate with anxious distress ([place]) 06/21/2015  . NSTEMI (non-ST elevated myocardial infarction) ([place]) 06/20/2015  . STEMI (ST elevation myocardial infarction) ([place]) 01/01/2015  . Acute MI, inferoposterior wall ([place]) 01/01/2015  . Common bile duct dilation 10/06/2014  . Right lower quadrant abdominal pain 10/06/2014  . Loss of weight 10/06/2014  . Cervicogenic headache  07/24/2014  . Cervical spondylosis without myelopathy 07/24/2014  . Clinical depression 04/15/2013  . Acid reflux 04/15/2013  . BP (high blood pressure) 04/15/2013  . Encounter for other preprocedural examination 04/15/2013  . Nasal obstruction 02/12/2013  . Inflamed nasal mucosa 02/12/2013  . Sinus infection 02/12/2013  . Gastric catarrh 03/01/2011  . Allergic rhinitis 10/20/2010  . Airway hyperreactivity 10/20/2010  . Anxiety, generalized 10/20/2010  . Cervical pain 10/20/2010  . Current tobacco use 10/20/2010    Past Surgical History:  Procedure Laterality Date  . ANTERIOR CERVICAL DECOMP/DISCECTOMY FUSION  11/13/2011   Procedure: ANTERIOR CERVICAL DECOMPRESSION/DISCECTOMY FUSION 1 LEVEL;  Surgeon: [name], MD;  Location: [place] NEURO ORS;  Service: Neurosurgery;  Laterality: N/A;  Anterior Cervical Decompression/discectomy Fusion. Cervical three-four.  Marland Kitchen CARDIAC CATHETERIZATION N/A 01/01/2015   Procedure: Left Heart Cath and Coronary Angiography;  Surgeon: [name], MD;  Location: [place] [facility];  Service: Cardiovascular;  Laterality: N/A;  . NASAL SINUS SURGERY     2005    Prior to Admission medications   Medication Sig Start Date End Date Taking? Authorizing Provider  atorvastatin (LIPITOR) 80 MG tablet Take 1 tablet (80 mg total) by mouth daily at 6 PM. 06/21/15   Bettey Costa, MD  clonazePAM (KLONOPIN) 1 MG tablet Take 1 mg by mouth 3 (three) times daily.     Historical Provider, MD  dexlansoprazole (DEXILANT) 60 MG capsule Take 60 mg by mouth daily.    Historical Provider, MD  doxepin (SINEQUAN) 50 MG capsule Take  50 mg by mouth at bedtime as needed.    Historical Provider, MD  esomeprazole (NEXIUM) 40 MG capsule Take 40 mg by mouth daily at 12 noon.    Historical Provider, MD  glimepiride (AMARYL) 1 MG tablet Take 1 tablet (1 mg total) by mouth every morning. Patient not taking: Reported on 11/24/2015 09/04/15 09/03/16  Delman Kitten, MD  hydrOXYzine (ATARAX/VISTARIL) 25  MG tablet Take 1 tablet (25 mg total) by mouth 3 (three) times daily as needed for anxiety. 12/13/15   Harvest Dark, MD  lisinopril (PRINIVIL,ZESTRIL) 5 MG tablet Take 1 tablet (5 mg total) by mouth daily. 06/21/15   Sital Mody, MD  LORazepam (ATIVAN) 1 MG tablet Take 1 mg by mouth 3 (three) times daily.    Historical Provider, MD  mirtazapine (REMERON) 45 MG tablet Take 1 tablet (45 mg total) by mouth at bedtime. Patient taking differently: Take 30 mg by mouth at bedtime.  08/11/15   Gonzella Lex, MD  nitroGLYCERIN (NITROSTAT) 0.4 MG SL tablet Place 1 tablet (0.4 mg total) under the tongue every 5 (five) minutes x 3 doses as needed for chest pain. 06/21/15   Sital Mody, MD  ondansetron (ZOFRAN ODT) 4 MG disintegrating tablet Take 1 tablet (4 mg total) by mouth every 6 (six) hours as needed for nausea or vomiting. Patient not taking: Reported on 11/24/2015 09/04/15   Delman Kitten, MD  prasugrel (EFFIENT) 10 MG TABS tablet Take 10 mg by mouth daily.    Historical Provider, MD  promethazine (PHENERGAN) 12.5 MG tablet Take 1 tablet (12.5 mg total) by mouth every 6 (six) hours as needed for nausea or vomiting. 12/06/15   Drenda Freeze, MD  propranolol (INDERAL) 10 MG tablet Take 10 mg by mouth 2 (two) times daily.    Historical Provider, MD  QUEtiapine (SEROQUEL) 100 MG tablet Take 100 mg by mouth at bedtime.    Historical Provider, MD  vortioxetine HBr (TRINTELLIX) 10 MG TABS Take 1 tablet by mouth daily.    Historical Provider, MD    Allergies Prednisone; Hydrocodone; and Varenicline  Family History  Problem Relation Age of Onset  . Prostate cancer Father   . Depression Father   . Hypertension Mother   . Kidney Stones Mother   . Anxiety disorder Mother   . Depression Mother   . COPD Sister   . Hypertension Sister   . Diabetes Sister   . Depression Sister   . Anxiety disorder Sister   . Seizures Sister     Social History Social History  Substance Use Topics  . Smoking status:  Current Every Day Smoker    Packs/day: 1.50    Years: 17.00    Types: Cigarettes  . Smokeless tobacco: Never Used  . Alcohol use 7.2 oz/week    12 Cans of beer per week     Comment: beer- 1-2 x's per month     Review of Systems Constitutional: Positive intermittent fever/chills Eyes: No visual changes. ENT: Positive for runny nose drainage sore throat. Cardiovascular: Positive chest pain secondary to coughing. Respiratory: Admits shortness of breath at times when coughing. Gastrointestinal: No abdominal pain.  No nausea, no vomiting.  No diarrhea.  No constipation. Genitourinary: Negative for dysuria. Musculoskeletal: Negative for back pain. Skin: Negative for rash. Neurological: Negative for headaches, focal weakness or numbness.  10-point ROS otherwise negative.  ____________________________________________   PHYSICAL EXAM:  VITAL SIGNS: ED Triage Vitals  Enc Vitals Group     BP 03/26/16 0928 Marland Kitchen)  144/84     Pulse Rate 03/26/16 0928 89     Resp 03/26/16 0928 17     Temp 03/26/16 0928 98.1 F (36.7 C)     Temp Source 03/26/16 0928 Oral     SpO2 03/26/16 0928 98 %     Weight 03/26/16 0929 200 lb (90.7 kg)     Height 03/26/16 0929 6\' 2"  (1.88 m)     Head Circumference --      Peak Flow --      Pain Score --      Pain Loc --      Pain Edu? --      Excl. in [place]? --     Constitutional: Alert and oriented. Well appearing and in Mild distress. Patient is tearful during exam. Eyes: Conjunctivae are normal. PERRL. EOMI. Head: Atraumatic. Nose: Positive congestion/rhinnorhea. Mouth/Throat: Mucous membranes are moist.  Oropharynx non-erythematous. Neck: No stridor. Minimal adenopathy noted.  Cardiovascular: Normal rate, regular rhythm. Grossly normal heart sounds.  Good peripheral circulation. Respiratory: Normal respiratory effort.  No retractions. Lungs decreased breath sounds bilaterally. No acute rhonchi. Gastrointestinal: Soft and nontender. No distention. No  abdominal bruits. No CVA tenderness. Musculoskeletal: No lower extremity tenderness nor edema.  No joint effusions. Neurologic:  Normal speech and language. No gross focal neurologic deficits are appreciated. No gait instability. Skin:  Skin is warm, dry and intact. No rash noted. Psychiatric: Mood and affect are normal. Speech and behavior are normal.  ____________________________________________   LABS (all labs ordered are listed, but only abnormal results are displayed)  Labs Reviewed  INFLUENZA PANEL BY PCR (TYPE A & B, H1N1)   ____________________________________________  EKG   ____________________________________________  RADIOLOGY  Bronchitic changes noted ____________________________________________   PROCEDURES  Procedure(s) performed: None  Critical Care performed: No  ____________________________________________   INITIAL IMPRESSION / ASSESSMENT AND PLAN / ED COURSE  Pertinent labs & imaging results that were available during my care of the patient were reviewed by me and considered in my medical decision making (see chart for details). Review of the  [place]RS was performed in accordance of the [place] prior to dispensing any controlled drugs.  Acute URI with bronchitis. In addition patient is feeling very depressed. We'll transfer to the ED for further evaluation  Clinical Course     ____________________________________________   FINAL CLINICAL IMPRESSION(S) / ED DIAGNOSES  Final diagnoses:  None     This chart was dictated using voice recognition software/Dragon. Despite best efforts to proofread, errors can occur which can change the meaning. Any change was purely unintentional.    Arlyss Repress, PA-C 03/26/16 [place], MD 03/26/16 1251    Lavonia Drafts, MD 03/26/16 [phone]

## 2016-03-26 NOTE — ED Notes (Signed)
SOC at bedside, MD talking to pt

## 2016-03-26 NOTE — ED Notes (Signed)
Pt changed into paper scrubs by Edison Nasuti, EDT, patient's family left to go home, report called to [place], [place]. Pt transferred to room 20 due to voicing SI to [place], [place]. Pt did not voice SI with this RN.

## 2016-03-26 NOTE — Discharge Instructions (Addendum)
STOP Remeron and Paxil.  You have been seen in the Emergency Department (ED) today for a psychiatric complaint.  You have been evaluated by psychiatry and we believe you are safe to be discharged from the hospital.    Please return to the ED immediately if you have ANY thoughts of hurting yourself or anyone else, so that we may help you.  Please avoid alcohol and drug use.  Follow up with your doctor and/or therapist as soon as possible regarding today's ED visit.   Please follow up any other recommendations and clinic appointments provided by the psychiatry team that saw you in the Emergency Department.

## 2016-03-26 NOTE — ED Triage Notes (Signed)
Patient presents ambulatory to [facility] ED via POV with complaint of nasal congestion, cough, N/V (yesterday), generalized weakness, body aches, shortness of breath with 2 PPD smoking HX

## 2016-03-26 NOTE — ED Provider Notes (Signed)
Specialist on-call has rescinded the patient's involuntary commitment. They have made medication recommendations, this was discussed with the patient including discontinuing his Paxil as well as Remeron.  I provided short prescriptions to the patient for Klonopin, Zyprexa, and fluoxetine as recommended by psychiatry and the patient understands that he needs to set up follow-up with his doctor and primary physician/psychiatrist this week for ongoing management and refills of his medications.  Patient is alert, well oriented, denies any suicidal ideations or desire to harm himself or anyone else to me.  Return precautions and treatment recommendations and follow-up discussed with the patient who is agreeable with the plan.    Delman Kitten, MD 03/26/16 [phone]

## 2016-03-26 NOTE — ED Notes (Addendum)
Pt presents to ED with c/o nasal and head congestion, cough, fever, N/V yesterday, has not had an emesis episode since approx 1400 yesterday. Pt is alert and oriented at this time. Pt states his symptoms started approx 4 days ago.

## 2016-03-29 ENCOUNTER — Other Ambulatory Visit: Payer: Self-pay | Admitting: *Deleted

## 2016-03-29 NOTE — Patient Outreach (Signed)
[place] [facility]) Care Management  03/29/2016  Michael Schmitt 1970/06/26 [id number]   RN Health Coach  attempted #1telephone screening call to patient.  Patient was unavailable. HIPPA compliance message was left with person answering phone with return callback number given.   Plan: RN will call patient again within 14 days.  [place] Care Management [phone]

## 2016-04-04 ENCOUNTER — Other Ambulatory Visit: Payer: Self-pay

## 2016-04-04 ENCOUNTER — Ambulatory Visit: Payer: Self-pay

## 2016-04-04 NOTE — Patient Outreach (Signed)
[place] [facility]) Care Management  04/04/2016  Michael Schmitt 04-17-1970 [id number]   2nd telephone call to patient for high ED utilization referral. No answer.  HIPAA compliant voice message left.  Plan: RN Health Coach will attempt patient again within 14 days.  Jone Baseman, RN, MSN [place] [phone]

## 2016-04-05 ENCOUNTER — Inpatient Hospital Stay (HOSPITAL_COMMUNITY)
Admission: EM | Admit: 2016-04-05 | Discharge: 2016-04-07 | DRG: 246 | Disposition: A | Discharge status: Home / Self Care | Payer: Medicare Other | Source: Other Acute Inpatient Hospital | Attending: Interventional Cardiology | Admitting: Interventional Cardiology

## 2016-04-05 ENCOUNTER — Encounter (HOSPITAL_COMMUNITY)
Admission: EM | Disposition: A | Discharge status: Home / Self Care | Payer: Self-pay | Source: Other Acute Inpatient Hospital | Attending: Interventional Cardiology

## 2016-04-05 ENCOUNTER — Inpatient Hospital Stay (HOSPITAL_COMMUNITY): Payer: Medicare Other

## 2016-04-05 ENCOUNTER — Encounter (HOSPITAL_COMMUNITY): Payer: Self-pay | Admitting: Physician Assistant

## 2016-04-05 ENCOUNTER — Emergency Department: Payer: Medicare Other

## 2016-04-05 ENCOUNTER — Emergency Department
Admission: EM | Admit: 2016-04-05 | Discharge: 2016-04-05 | Payer: Medicare Other | Attending: Emergency Medicine | Admitting: Emergency Medicine

## 2016-04-05 ENCOUNTER — Encounter: Payer: Self-pay | Admitting: *Deleted

## 2016-04-05 DIAGNOSIS — I2102 ST elevation (STEMI) myocardial infarction involving left anterior descending coronary artery: Secondary | ICD-10-CM | POA: Diagnosis not present

## 2016-04-05 DIAGNOSIS — E785 Hyperlipidemia, unspecified: Secondary | ICD-10-CM | POA: Diagnosis present

## 2016-04-05 DIAGNOSIS — E119 Type 2 diabetes mellitus without complications: Secondary | ICD-10-CM | POA: Diagnosis not present

## 2016-04-05 DIAGNOSIS — G2581 Restless legs syndrome: Secondary | ICD-10-CM | POA: Diagnosis present

## 2016-04-05 DIAGNOSIS — E1122 Type 2 diabetes mellitus with diabetic chronic kidney disease: Secondary | ICD-10-CM | POA: Insufficient documentation

## 2016-04-05 DIAGNOSIS — I214 Non-ST elevation (NSTEMI) myocardial infarction: Secondary | ICD-10-CM | POA: Diagnosis present

## 2016-04-05 DIAGNOSIS — K219 Gastro-esophageal reflux disease without esophagitis: Secondary | ICD-10-CM | POA: Diagnosis present

## 2016-04-05 DIAGNOSIS — I4901 Ventricular fibrillation: Secondary | ICD-10-CM | POA: Diagnosis not present

## 2016-04-05 DIAGNOSIS — F1010 Alcohol abuse, uncomplicated: Secondary | ICD-10-CM | POA: Diagnosis present

## 2016-04-05 DIAGNOSIS — F411 Generalized anxiety disorder: Secondary | ICD-10-CM | POA: Diagnosis present

## 2016-04-05 DIAGNOSIS — T82855A Stenosis of coronary artery stent, initial encounter: Secondary | ICD-10-CM | POA: Diagnosis not present

## 2016-04-05 DIAGNOSIS — Z79899 Other long term (current) drug therapy: Secondary | ICD-10-CM | POA: Diagnosis not present

## 2016-04-05 DIAGNOSIS — F17210 Nicotine dependence, cigarettes, uncomplicated: Secondary | ICD-10-CM | POA: Insufficient documentation

## 2016-04-05 DIAGNOSIS — I472 Ventricular tachycardia: Secondary | ICD-10-CM | POA: Diagnosis present

## 2016-04-05 DIAGNOSIS — N189 Chronic kidney disease, unspecified: Secondary | ICD-10-CM | POA: Diagnosis present

## 2016-04-05 DIAGNOSIS — I129 Hypertensive chronic kidney disease with stage 1 through stage 4 chronic kidney disease, or unspecified chronic kidney disease: Secondary | ICD-10-CM | POA: Diagnosis not present

## 2016-04-05 DIAGNOSIS — T82867A Thrombosis of cardiac prosthetic devices, implants and grafts, initial encounter: Secondary | ICD-10-CM | POA: Diagnosis not present

## 2016-04-05 DIAGNOSIS — Z885 Allergy status to narcotic agent status: Secondary | ICD-10-CM | POA: Diagnosis not present

## 2016-04-05 DIAGNOSIS — G8929 Other chronic pain: Secondary | ICD-10-CM | POA: Diagnosis not present

## 2016-04-05 DIAGNOSIS — Z955 Presence of coronary angioplasty implant and graft: Secondary | ICD-10-CM

## 2016-04-05 DIAGNOSIS — I469 Cardiac arrest, cause unspecified: Secondary | ICD-10-CM | POA: Insufficient documentation

## 2016-04-05 DIAGNOSIS — I2510 Atherosclerotic heart disease of native coronary artery without angina pectoris: Secondary | ICD-10-CM | POA: Insufficient documentation

## 2016-04-05 DIAGNOSIS — R9431 Abnormal electrocardiogram [ECG] [EKG]: Secondary | ICD-10-CM | POA: Diagnosis not present

## 2016-04-05 DIAGNOSIS — F329 Major depressive disorder, single episode, unspecified: Secondary | ICD-10-CM | POA: Diagnosis present

## 2016-04-05 DIAGNOSIS — R079 Chest pain, unspecified: Secondary | ICD-10-CM | POA: Diagnosis present

## 2016-04-05 DIAGNOSIS — M549 Dorsalgia, unspecified: Secondary | ICD-10-CM | POA: Diagnosis present

## 2016-04-05 DIAGNOSIS — I213 ST elevation (STEMI) myocardial infarction of unspecified site: Secondary | ICD-10-CM

## 2016-04-05 DIAGNOSIS — M47812 Spondylosis without myelopathy or radiculopathy, cervical region: Secondary | ICD-10-CM | POA: Diagnosis present

## 2016-04-05 DIAGNOSIS — Z888 Allergy status to other drugs, medicaments and biological substances status: Secondary | ICD-10-CM

## 2016-04-05 DIAGNOSIS — Y831 Surgical operation with implant of artificial internal device as the cause of abnormal reaction of the patient, or of later complication, without mention of misadventure at the time of the procedure: Secondary | ICD-10-CM | POA: Diagnosis present

## 2016-04-05 HISTORY — DX: Ventricular fibrillation: I49.01

## 2016-04-05 HISTORY — PX: CARDIAC CATHETERIZATION: SHX172

## 2016-04-05 LAB — CBC
HCT: 49.7 % (ref 40.0–52.0)
HCT: 49.9 % (ref 39.0–52.0)
HEMOGLOBIN: 16.9 g/dL (ref 13.0–17.0)
Hemoglobin: 16.9 g/dL (ref 13.0–18.0)
MCH: 30.7 pg (ref 26.0–34.0)
MCH: 31.1 pg (ref 26.0–34.0)
MCHC: 34 g/dL (ref 32.0–36.0)
MCHC: 33.9 g/dL (ref 30.0–36.0)
MCV: 90.5 fL (ref 80.0–100.0)
MCV: 91.9 fL (ref 78.0–100.0)
Platelets: 418 10*3/uL (ref 150–440)
Platelets: 302 10*3/uL (ref 150–400)
RBC: 5.49 MIL/uL (ref 4.40–5.90)
RBC: 5.43 MIL/uL (ref 4.22–5.81)
RDW: 13.7 % (ref 11.5–14.5)
RDW: 13.5 % (ref 11.5–15.5)
WBC: 12.6 10*3/uL — ABNORMAL HIGH (ref 3.8–10.6)
WBC: 17.1 10*3/uL — ABNORMAL HIGH (ref 4.0–10.5)

## 2016-04-05 LAB — BASIC METABOLIC PANEL
ANION GAP: 9 (ref 5–15)
Anion gap: 11 (ref 5–15)
BUN: 8 mg/dL (ref 6–20)
BUN: 10 mg/dL (ref 6–20)
CHLORIDE: 103 mmol/L (ref 101–111)
CO2: 24 mmol/L (ref 22–32)
CO2: 22 mmol/L (ref 22–32)
Calcium: 9.5 mg/dL (ref 8.9–10.3)
Calcium: 9.3 mg/dL (ref 8.9–10.3)
Chloride: 102 mmol/L (ref 101–111)
Creatinine, Ser: 1.03 mg/dL (ref 0.61–1.24)
Creatinine, Ser: 0.97 mg/dL (ref 0.61–1.24)
GFR calc non Af Amer: >60 mL/min (ref >60)
GLUCOSE: 115 mg/dL — AB (ref 65–99)
Glucose, Bld: 149 mg/dL — ABNORMAL HIGH (ref 65–99)
Potassium: 5.2 mmol/L — ABNORMAL HIGH (ref 3.5–5.1)
Potassium: 3.7 mmol/L (ref 3.5–5.1)
SODIUM: 135 mmol/L (ref 135–145)
Sodium: 136 mmol/L (ref 135–145)

## 2016-04-05 LAB — LIPID PANEL
CHOL/HDL RATIO: 5.9 ratio
CHOLESTEROL: 246 mg/dL — AB (ref 0–200)
HDL: 42 mg/dL (ref >40)
LDL Cholesterol: UNDETERMINED mg/dL (ref 0–99)
TRIGLYCERIDES: 860 mg/dL — AB (ref <150)
VLDL: UNDETERMINED mg/dL (ref 0–40)

## 2016-04-05 LAB — TROPONIN I
TROPONIN I: 17.08 ng/mL — AB (ref <0.03)
TROPONIN I: 31.74 ng/mL — AB (ref <0.03)
Troponin I: 0.08 ng/mL (ref <0.03)
Troponin I: 2.34 ng/mL (ref <0.03)

## 2016-04-05 LAB — DIFFERENTIAL
BASOS PCT: 2 %
Basophils Absolute: 0.3 10*3/uL — ABNORMAL HIGH (ref 0–0.1)
EOS PCT: 1 %
Eosinophils Absolute: 0.2 10*3/uL (ref 0–0.7)
Lymphocytes Relative: 25 %
Lymphs Abs: 3.1 10*3/uL (ref 1.0–3.6)
Monocytes Absolute: 0.9 10*3/uL (ref 0.2–1.0)
Monocytes Relative: 7 %
NEUTROS PCT: 65 %
Neutro Abs: 8.1 10*3/uL — ABNORMAL HIGH (ref 1.4–6.5)

## 2016-04-05 LAB — COMPREHENSIVE METABOLIC PANEL
ALT: 47 U/L (ref 17–63)
ANION GAP: 11 (ref 5–15)
AST: 56 U/L — ABNORMAL HIGH (ref 15–41)
Albumin: 4.1 g/dL (ref 3.5–5.0)
Alkaline Phosphatase: 100 U/L (ref 38–126)
BILIRUBIN TOTAL: 0.5 mg/dL (ref 0.3–1.2)
BUN: 11 mg/dL (ref 6–20)
CHLORIDE: 102 mmol/L (ref 101–111)
CO2: 27 mmol/L (ref 22–32)
Calcium: 9.8 mg/dL (ref 8.9–10.3)
Creatinine, Ser: 1.21 mg/dL (ref 0.61–1.24)
Glucose, Bld: 151 mg/dL — ABNORMAL HIGH (ref 65–99)
POTASSIUM: 3.8 mmol/L (ref 3.5–5.1)
Sodium: 140 mmol/L (ref 135–145)
TOTAL PROTEIN: 7.5 g/dL (ref 6.5–8.1)

## 2016-04-05 LAB — PROTIME-INR
INR: 0.93
PROTHROMBIN TIME: 12.5 s (ref 11.4–15.2)

## 2016-04-05 LAB — POCT ACTIVATED CLOTTING TIME: ACTIVATED CLOTTING TIME: 301 s

## 2016-04-05 LAB — ECHOCARDIOGRAM COMPLETE
Height: 74 in
Weight: 3058.22 oz

## 2016-04-05 LAB — APTT: APTT: 26 s (ref 24–36)

## 2016-04-05 LAB — PLATELET COUNT
PLATELETS: 292 10*3/uL (ref 150–400)
Platelets: 285 10*3/uL (ref 150–400)

## 2016-04-05 LAB — LIPASE, BLOOD: Lipase: 27 U/L (ref 11–51)

## 2016-04-05 LAB — MRSA PCR SCREENING: MRSA by PCR: NEGATIVE

## 2016-04-05 SURGERY — LEFT HEART CATH AND CORONARY ANGIOGRAPHY

## 2016-04-05 MED ORDER — HEPARIN SODIUM (PORCINE) 1000 UNIT/ML IJ SOLN
INTRAMUSCULAR | Status: DC | PRN
  Administered 2016-04-05 (×2): 4000 [IU] via INTRAVENOUS
  Administered 2016-04-05: 2000 [IU] via INTRAVENOUS

## 2016-04-05 MED ORDER — HYDRALAZINE HCL 20 MG/ML IJ SOLN
5.0000 mg | INTRAMUSCULAR | Status: AC | PRN
  Administered 2016-04-05: 5 mg via INTRAVENOUS
  Filled 2016-04-05: qty 1

## 2016-04-05 MED ORDER — LISINOPRIL 5 MG PO TABS
5.0000 mg | ORAL_TABLET | Freq: Every day | ORAL | Status: DC
  Administered 2016-04-05 – 2016-04-07 (×3): 5 mg via ORAL
  Filled 2016-04-05 (×3): qty 1

## 2016-04-05 MED ORDER — ATORVASTATIN CALCIUM 80 MG PO TABS
80.0000 mg | ORAL_TABLET | Freq: Every day | ORAL | Status: DC
  Administered 2016-04-05 – 2016-04-06 (×2): 80 mg via ORAL
  Filled 2016-04-05 (×2): qty 1

## 2016-04-05 MED ORDER — HEPARIN (PORCINE) IN NACL 2-0.9 UNIT/ML-% IJ SOLN
INTRAMUSCULAR | Status: DC | PRN
  Administered 2016-04-05: 10 mL via INTRA_ARTERIAL

## 2016-04-05 MED ORDER — NITROGLYCERIN IN D5W 200-5 MCG/ML-% IV SOLN
2.0000 ug/min | INTRAVENOUS | Status: DC
  Administered 2016-04-05: 2 ug/min via INTRAVENOUS
  Filled 2016-04-05: qty 250

## 2016-04-05 MED ORDER — NITROGLYCERIN 1 MG/10 ML FOR IR/CATH LAB
INTRA_ARTERIAL | Status: DC | PRN
  Administered 2016-04-05: 200 ug via INTRACORONARY

## 2016-04-05 MED ORDER — NICOTINE 21 MG/24HR TD PT24
21.0000 mg | MEDICATED_PATCH | Freq: Every day | TRANSDERMAL | Status: DC
  Administered 2016-04-05 – 2016-04-07 (×3): 21 mg via TRANSDERMAL
  Filled 2016-04-05 (×3): qty 1

## 2016-04-05 MED ORDER — QUETIAPINE FUMARATE 100 MG PO TABS: 100.0000 mg | ORAL_TABLET | Freq: Every day | ORAL | Status: DC

## 2016-04-05 MED ORDER — ASPIRIN 81 MG PO CHEW: 81.0000 mg | CHEWABLE_TABLET | Freq: Every day | ORAL | Status: DC

## 2016-04-05 MED ORDER — SODIUM CHLORIDE 0.9% FLUSH
3.0000 mL | Freq: Two times a day (BID) | INTRAVENOUS | Status: DC
  Administered 2016-04-05 – 2016-04-06 (×3): 3 mL via INTRAVENOUS

## 2016-04-05 MED ORDER — HEPARIN SODIUM (PORCINE) 5000 UNIT/ML IJ SOLN
4000.0000 [IU] | INTRAMUSCULAR | Status: AC
  Administered 2016-04-05: 4000 [IU] via INTRAVENOUS

## 2016-04-05 MED ORDER — MORPHINE SULFATE (PF) 4 MG/ML IV SOLN
4.0000 mg | Freq: Once | INTRAVENOUS | Status: AC
  Administered 2016-04-05: 4 mg via INTRAVENOUS

## 2016-04-05 MED ORDER — CARVEDILOL 3.125 MG PO TABS
3.1250 mg | ORAL_TABLET | Freq: Two times a day (BID) | ORAL | Status: DC
  Administered 2016-04-05 – 2016-04-06 (×3): 3.125 mg via ORAL
  Filled 2016-04-05 (×3): qty 1

## 2016-04-05 MED ORDER — HEPARIN SODIUM (PORCINE) 5000 UNIT/ML IJ SOLN
5000.0000 [IU] | Freq: Three times a day (TID) | INTRAMUSCULAR | Status: DC
  Administered 2016-04-05 – 2016-04-07 (×5): 5000 [IU] via SUBCUTANEOUS
  Filled 2016-04-05 (×5): qty 1

## 2016-04-05 MED ORDER — LIDOCAINE 5 % EX PTCH
1.0000 | MEDICATED_PATCH | CUTANEOUS | Status: DC
  Administered 2016-04-05 – 2016-04-07 (×3): 1 via TRANSDERMAL
  Filled 2016-04-05 (×3): qty 1

## 2016-04-05 MED ORDER — LABETALOL HCL 5 MG/ML IV SOLN: 10.0000 mg | INTRAVENOUS | Status: AC | PRN

## 2016-04-05 MED ORDER — FENTANYL CITRATE (PF) 100 MCG/2ML IJ SOLN
INTRAMUSCULAR | Status: AC
  Filled 2016-04-05: qty 2

## 2016-04-05 MED ORDER — NITROGLYCERIN 1 MG/10 ML FOR IR/CATH LAB
INTRA_ARTERIAL | Status: AC
  Filled 2016-04-05: qty 10

## 2016-04-05 MED ORDER — HEPARIN SODIUM (PORCINE) 1000 UNIT/ML IJ SOLN
INTRAMUSCULAR | Status: AC
  Filled 2016-04-05: qty 1

## 2016-04-05 MED ORDER — TIROFIBAN HCL IN NACL 5-0.9 MG/100ML-% IV SOLN
INTRAVENOUS | Status: DC | PRN
  Administered 2016-04-05: 0.15 ug/kg/min via INTRAVENOUS

## 2016-04-05 MED ORDER — SODIUM CHLORIDE 0.9 % IV SOLN
10.0000 mL/h | INTRAVENOUS | Status: DC
  Administered 2016-04-05: 20 mL/h via INTRAVENOUS

## 2016-04-05 MED ORDER — TICAGRELOR 90 MG PO TABS
ORAL_TABLET | ORAL | Status: AC
  Filled 2016-04-05: qty 2

## 2016-04-05 MED ORDER — METOPROLOL TARTRATE 12.5 MG HALF TABLET: 12.5000 mg | ORAL_TABLET | Freq: Two times a day (BID) | ORAL | Status: DC

## 2016-04-05 MED ORDER — FENTANYL CITRATE (PF) 100 MCG/2ML IJ SOLN
INTRAMUSCULAR | Status: DC | PRN
  Administered 2016-04-05: 75 ug via INTRAVENOUS
  Administered 2016-04-05: 50 ug via INTRAVENOUS

## 2016-04-05 MED ORDER — ONDANSETRON HCL 4 MG/2ML IJ SOLN
4.0000 mg | Freq: Once | INTRAMUSCULAR | Status: AC
  Administered 2016-04-05: 4 mg via INTRAVENOUS

## 2016-04-05 MED ORDER — IOPAMIDOL (ISOVUE-370) INJECTION 76%
INTRAVENOUS | Status: DC | PRN
  Administered 2016-04-05: 185 mL via INTRA_ARTERIAL

## 2016-04-05 MED ORDER — TICAGRELOR 90 MG PO TABS: 90.0000 mg | ORAL_TABLET | Freq: Two times a day (BID) | ORAL | Status: DC

## 2016-04-05 MED ORDER — FLUOXETINE HCL 20 MG PO CAPS
20.0000 mg | ORAL_CAPSULE | Freq: Every day | ORAL | Status: DC
  Administered 2016-04-05 – 2016-04-06 (×2): 20 mg via ORAL
  Filled 2016-04-05 (×2): qty 1

## 2016-04-05 MED ORDER — SODIUM CHLORIDE 0.9 % WEIGHT BASED INFUSION: 1.0000 mL/kg/h | INTRAVENOUS | Status: AC

## 2016-04-05 MED ORDER — LIDOCAINE HCL (PF) 1 % IJ SOLN
INTRAMUSCULAR | Status: DC | PRN
  Administered 2016-04-05: 3 mL

## 2016-04-05 MED ORDER — IOPAMIDOL (ISOVUE-370) INJECTION 76%
INTRAVENOUS | Status: AC
  Filled 2016-04-05: qty 100

## 2016-04-05 MED ORDER — MORPHINE SULFATE (PF) 4 MG/ML IV SOLN
INTRAVENOUS | Status: AC
  Administered 2016-04-05: 4 mg via INTRAVENOUS
  Filled 2016-04-05: qty 1

## 2016-04-05 MED ORDER — ACETAMINOPHEN 325 MG PO TABS: 650.0000 mg | ORAL_TABLET | ORAL | Status: DC | PRN

## 2016-04-05 MED ORDER — AMIODARONE IV BOLUS ONLY 150 MG/100ML: 150.0000 mg | Freq: Once | INTRAVENOUS | Status: DC

## 2016-04-05 MED ORDER — HEPARIN (PORCINE) IN NACL 2-0.9 UNIT/ML-% IJ SOLN
INTRAMUSCULAR | Status: DC | PRN
  Administered 2016-04-05: 1000 mL

## 2016-04-05 MED ORDER — MIDAZOLAM HCL 2 MG/2ML IJ SOLN
INTRAMUSCULAR | Status: AC
  Filled 2016-04-05: qty 2

## 2016-04-05 MED ORDER — TIROFIBAN HCL IN NACL 5-0.9 MG/100ML-% IV SOLN
0.1500 ug/kg/min | INTRAVENOUS | Status: AC
  Administered 2016-04-05 (×3): 0.15 ug/kg/min via INTRAVENOUS
  Filled 2016-04-05 (×3): qty 100

## 2016-04-05 MED ORDER — ASPIRIN EC 81 MG PO TBEC
81.0000 mg | DELAYED_RELEASE_TABLET | Freq: Every day | ORAL | Status: DC
  Administered 2016-04-06 – 2016-04-07 (×2): 81 mg via ORAL
  Filled 2016-04-05 (×2): qty 1

## 2016-04-05 MED ORDER — IOPAMIDOL (ISOVUE-370) INJECTION 76%
INTRAVENOUS | Status: AC
  Filled 2016-04-05: qty 125

## 2016-04-05 MED ORDER — PRASUGREL HCL 10 MG PO TABS: 10.0000 mg | ORAL_TABLET | Freq: Every day | ORAL | Status: DC

## 2016-04-05 MED ORDER — PANTOPRAZOLE SODIUM 40 MG PO TBEC
40.0000 mg | DELAYED_RELEASE_TABLET | Freq: Every day | ORAL | Status: DC
  Administered 2016-04-06 – 2016-04-07 (×2): 40 mg via ORAL
  Filled 2016-04-05 (×2): qty 1

## 2016-04-05 MED ORDER — TICAGRELOR 90 MG PO TABS
90.0000 mg | ORAL_TABLET | Freq: Two times a day (BID) | ORAL | Status: DC
  Administered 2016-04-05 – 2016-04-07 (×4): 90 mg via ORAL
  Filled 2016-04-05 (×5): qty 1

## 2016-04-05 MED ORDER — MIDAZOLAM HCL 2 MG/2ML IJ SOLN
INTRAMUSCULAR | Status: DC | PRN
  Administered 2016-04-05: 1 mg via INTRAVENOUS

## 2016-04-05 MED ORDER — MORPHINE SULFATE (PF) 2 MG/ML IV SOLN
2.0000 mg | INTRAVENOUS | Status: DC | PRN
  Administered 2016-04-05 – 2016-04-06 (×6): 4 mg via INTRAVENOUS
  Administered 2016-04-06: 2 mg via INTRAVENOUS
  Administered 2016-04-06 (×3): 4 mg via INTRAVENOUS
  Administered 2016-04-07 (×2): 2 mg via INTRAVENOUS
  Filled 2016-04-05: qty 1
  Filled 2016-04-05 (×8): qty 2
  Filled 2016-04-05: qty 1
  Filled 2016-04-05: qty 2
  Filled 2016-04-05: qty 1

## 2016-04-05 MED ORDER — ONDANSETRON HCL 4 MG/2ML IJ SOLN: 4.0000 mg | Freq: Four times a day (QID) | INTRAMUSCULAR | Status: DC | PRN

## 2016-04-05 MED ORDER — NITROGLYCERIN 0.4 MG SL SUBL: 0.4000 mg | SUBLINGUAL_TABLET | SUBLINGUAL | Status: DC | PRN

## 2016-04-05 MED ORDER — BIVALIRUDIN BOLUS VIA INFUSION - CUPID: INTRAVENOUS | Status: DC | PRN

## 2016-04-05 MED ORDER — TIROFIBAN HCL IN NACL 5-0.9 MG/100ML-% IV SOLN
INTRAVENOUS | Status: AC
  Filled 2016-04-05: qty 100

## 2016-04-05 MED ORDER — VORTIOXETINE HBR 10 MG PO TABS: 1.0000 | ORAL_TABLET | Freq: Every day | ORAL | Status: DC

## 2016-04-05 MED ORDER — VERAPAMIL HCL 2.5 MG/ML IV SOLN
INTRAVENOUS | Status: AC
  Filled 2016-04-05: qty 2

## 2016-04-05 MED ORDER — OLANZAPINE 5 MG PO TABS: 5.0000 mg | ORAL_TABLET | Freq: Every day | ORAL | Status: DC

## 2016-04-05 MED ORDER — LIDOCAINE HCL (PF) 1 % IJ SOLN
INTRAMUSCULAR | Status: AC
  Filled 2016-04-05: qty 30

## 2016-04-05 MED ORDER — MIRTAZAPINE 7.5 MG PO TABS
30.0000 mg | ORAL_TABLET | Freq: Every day | ORAL | Status: DC
  Administered 2016-04-05 – 2016-04-06 (×2): 30 mg via ORAL
  Filled 2016-04-05: qty 4
  Filled 2016-04-05 (×2): qty 1

## 2016-04-05 MED ORDER — ASPIRIN 81 MG PO CHEW
324.0000 mg | CHEWABLE_TABLET | Freq: Once | ORAL | Status: AC
  Administered 2016-04-05: 324 mg via ORAL

## 2016-04-05 MED ORDER — SODIUM CHLORIDE 0.9 % IV SOLN: 250.0000 mL | INTRAVENOUS | Status: DC | PRN

## 2016-04-05 MED ORDER — HEPARIN (PORCINE) IN NACL 2-0.9 UNIT/ML-% IJ SOLN
INTRAMUSCULAR | Status: AC
  Filled 2016-04-05: qty 1000

## 2016-04-05 MED ORDER — AMIODARONE IV BOLUS ONLY 150 MG/100ML
150.0000 mg | Freq: Once | INTRAVENOUS | Status: AC
  Administered 2016-04-05 (×2): 150 mg via INTRAVENOUS

## 2016-04-05 MED ORDER — BIVALIRUDIN 250 MG IV SOLR: INTRAVENOUS | Status: DC | PRN

## 2016-04-05 MED ORDER — CLONAZEPAM 0.5 MG PO TABS
1.0000 mg | ORAL_TABLET | Freq: Three times a day (TID) | ORAL | Status: DC | PRN
  Administered 2016-04-05 – 2016-04-07 (×5): 1 mg via ORAL
  Filled 2016-04-05 (×3): qty 1
  Filled 2016-04-05: qty 2
  Filled 2016-04-05: qty 1

## 2016-04-05 MED ORDER — ACETAMINOPHEN 325 MG PO TABS
650.0000 mg | ORAL_TABLET | ORAL | Status: DC | PRN
  Administered 2016-04-05 – 2016-04-06 (×2): 650 mg via ORAL
  Filled 2016-04-05 (×2): qty 2

## 2016-04-05 MED ORDER — GLIMEPIRIDE 1 MG PO TABS
1.0000 mg | ORAL_TABLET | Freq: Every day | ORAL | Status: DC
  Administered 2016-04-06 – 2016-04-07 (×2): 1 mg via ORAL
  Filled 2016-04-05 (×3): qty 1

## 2016-04-05 MED ORDER — ONDANSETRON HCL 4 MG/2ML IJ SOLN
INTRAMUSCULAR | Status: AC
  Administered 2016-04-05: 4 mg via INTRAVENOUS
  Filled 2016-04-05: qty 2

## 2016-04-05 MED ORDER — PANTOPRAZOLE SODIUM 40 MG PO TBEC
40.0000 mg | DELAYED_RELEASE_TABLET | Freq: Every day | ORAL | Status: DC
  Administered 2016-04-05: 40 mg via ORAL
  Filled 2016-04-05: qty 1

## 2016-04-05 MED ORDER — SODIUM CHLORIDE 0.9% FLUSH: 3.0000 mL | INTRAVENOUS | Status: DC | PRN

## 2016-04-05 MED ORDER — TICAGRELOR 90 MG PO TABS
ORAL_TABLET | ORAL | Status: DC | PRN
  Administered 2016-04-05: 180 mg via ORAL

## 2016-04-05 MED ORDER — PANTOPRAZOLE SODIUM 40 MG PO TBEC: 40.0000 mg | DELAYED_RELEASE_TABLET | Freq: Every day | ORAL | Status: DC

## 2016-04-05 MED ORDER — TIROFIBAN (AGGRASTAT) BOLUS VIA INFUSION
INTRAVENOUS | Status: DC | PRN
  Administered 2016-04-05: 2267.5 ug via INTRAVENOUS

## 2016-04-05 MED ORDER — MORPHINE SULFATE (PF) 2 MG/ML IV SOLN
2.0000 mg | INTRAVENOUS | Status: DC | PRN
  Administered 2016-04-05 (×2): 2 mg via INTRAVENOUS
  Filled 2016-04-05 (×2): qty 1

## 2016-04-05 MED ORDER — ALPRAZOLAM 0.5 MG PO TABS
0.5000 mg | ORAL_TABLET | Freq: Three times a day (TID) | ORAL | Status: DC | PRN
  Administered 2016-04-05 – 2016-04-06 (×4): 0.5 mg via ORAL
  Filled 2016-04-05 (×4): qty 1

## 2016-04-05 SURGICAL SUPPLY — 21 items
BALLN EMERGE MR 3.0X12 (BALLOONS) ×2
BALLN ~~LOC~~ EMERGE MR 3.75X6 (BALLOONS) ×2
BALLOON EMERGE MR 3.0X12 (BALLOONS) IMPLANT
BALLOON ~~LOC~~ EMERGE MR 3.75X6 (BALLOONS) IMPLANT
CATH EXTRAC PRONTO 5.5F 138CM (CATHETERS) ×1 IMPLANT
CATH INFINITI JR4 5F (CATHETERS) ×1 IMPLANT
CATH OPTICROSS 40MHZ (CATHETERS) ×1 IMPLANT
CATH VISTA GUIDE 6FR XBLAD3.0 (CATHETERS) ×1 IMPLANT
DEVICE RAD COMP TR BAND LRG (VASCULAR PRODUCTS) ×1 IMPLANT
ELECT DEFIB PAD ADLT CADENCE (PAD) ×2 IMPLANT
GLIDESHEATH SLEND A-KIT 6F 22G (SHEATH) ×1 IMPLANT
GUIDEWIRE INQWIRE 1.5J.035X260 (WIRE) IMPLANT
INQWIRE 1.5J .035X260CM (WIRE) ×2
KIT ENCORE 26 ADVANTAGE (KITS) ×1 IMPLANT
KIT HEART LEFT (KITS) ×2 IMPLANT
PACK CARDIAC CATHETERIZATION (CUSTOM PROCEDURE TRAY) ×2 IMPLANT
SLED PULL BACK IVUS (MISCELLANEOUS) ×1 IMPLANT
STENT SYNERGY DES 3.5X12 (Permanent Stent) ×1 IMPLANT
TRANSDUCER W/STOPCOCK (MISCELLANEOUS) ×2 IMPLANT
TUBING CIL FLEX 10 FLL-RA (TUBING) ×2 IMPLANT
WIRE ASAHI PROWATER 180CM (WIRE) ×1 IMPLANT

## 2016-04-05 NOTE — ED Provider Notes (Signed)
[facility] Emergency Department Provider Note  ____________________________________________   First MD Initiated Contact with Patient 04/05/16 [phone]     (approximate)  I have reviewed the triage vital signs and the nursing notes.   HISTORY  Chief Complaint Chest Pain  Level 5 caveat:  history/ROS limited by acute/critical illness  HPI Michael Schmitt is a 46 y.o. male with a prior history of coronary artery disease status post 3 stents last performed in 2016 at [facility].  He presents by EMS for acute onset chest pain couple of hours prior to arrival.  His initial EKG by EMS was concerning for questionable ischemia but reportedly did not meet STEMI criteria.  Upon arrival in triage 12-lead EKG was performed and it was clearly a STEMI.  EKG was brought to me and the patient was brought immediately to room 12.  See hospital course below for additional details.  Patient is pale and diaphoretic and ill-appearing upon arrival but he is alert and oriented.  His chest pain is central, severe, sharp, 10 out of 10.  He denies shortness of breath.   Past Medical History:  Diagnosis Date  . Anxiety   . Anxiety   . Arthritis    cerv. stenosis, spondylosis, HNP- lower back , has been followed in pain clinic, has  had injection s in cerv. area  . Blood dyscrasia    told that when he was young he was a" free bleeder"  . CAD (coronary artery disease)   . Cervical spondylosis without myelopathy 07/24/2014  . Cervicogenic headache 07/24/2014  . Chronic kidney disease    renal calculi- passed spontaneously  . Depression   . Diabetes mellitus without complication ([address])   . Fatty liver   . GERD (gastroesophageal reflux disease)   . Headache(784.0)   . Hypertension   . Mental disorder   . MI, old   . RLS (restless legs syndrome)    detected on sleep study  . Shortness of breath     Patient Active Problem List   Diagnosis Date Noted  . Generalized anxiety  disorder 08/11/2015  . Major depressive disorder, recurrent episode, moderate with anxious distress ([place]) 06/21/2015  . NSTEMI (non-ST elevated myocardial infarction) ([place]) 06/20/2015  . STEMI (ST elevation myocardial infarction) ([place]) 01/01/2015  . Acute MI, inferoposterior wall ([place]) 01/01/2015  . Common bile duct dilation 10/06/2014  . Right lower quadrant abdominal pain 10/06/2014  . Loss of weight 10/06/2014  . Cervicogenic headache 07/24/2014  . Cervical spondylosis without myelopathy 07/24/2014  . Clinical depression 04/15/2013  . Acid reflux 04/15/2013  . BP (high blood pressure) 04/15/2013  . Encounter for other preprocedural examination 04/15/2013  . Nasal obstruction 02/12/2013  . Inflamed nasal mucosa 02/12/2013  . Sinus infection 02/12/2013  . Gastric catarrh 03/01/2011  . Allergic rhinitis 10/20/2010  . Airway hyperreactivity 10/20/2010  . Anxiety, generalized 10/20/2010  . Cervical pain 10/20/2010  . Current tobacco use 10/20/2010    Past Surgical History:  Procedure Laterality Date  . ANTERIOR CERVICAL DECOMP/DISCECTOMY FUSION  11/13/2011   Procedure: ANTERIOR CERVICAL DECOMPRESSION/DISCECTOMY FUSION 1 LEVEL;  Surgeon: [name], MD;  Location: [place] [facility]URO ORS;  Service: Neurosurgery;  Laterality: N/A;  Anterior Cervical Decompression/discectomy Fusion. Cervical three-four.  Marland Kitchen CARDIAC CATHETERIZATION N/A 01/01/2015   Procedure: Left Heart Cath and Coronary Angiography;  Surgeon: [name], MD;  Location: [place] CV LAB;  Service: Cardiovascular;  Laterality: N/A;  . NASAL SINUS SURGERY     2005  Prior to Admission medications   Medication Sig Start Date End Date Taking? Authorizing Provider  amoxicillin-clavulanate (AUGMENTIN) 875-125 MG tablet Take 1 tablet by mouth 2 (two) times daily. 03/26/16   Delman Kitten, MD  atorvastatin (LIPITOR) 80 MG tablet Take 1 tablet (80 mg total) by mouth daily at 6 PM. 06/21/15   Bettey Costa, MD  clonazePAM (KLONOPIN) 1 MG  tablet Take 1 tablet (1 mg total) by mouth 3 (three) times daily as needed for anxiety. 03/26/16 04/02/16  Delman Kitten, MD  dexlansoprazole (DEXILANT) 60 MG capsule Take 60 mg by mouth daily.    Historical Provider, MD  doxepin (SINEQUAN) 50 MG capsule Take 50 mg by mouth at bedtime as needed.    Historical Provider, MD  esomeprazole (NEXIUM) 40 MG capsule Take 40 mg by mouth daily at 12 noon.    Historical Provider, MD  FLUoxetine (PROZAC) 20 MG capsule Take 1 capsule (20 mg total) by mouth at bedtime. 03/26/16 04/02/16  Delman Kitten, MD  glimepiride (AMARYL) 1 MG tablet Take 1 tablet (1 mg total) by mouth every morning. Patient not taking: Reported on 11/24/2015 09/04/15 09/03/16  Delman Kitten, MD  hydrOXYzine (ATARAX/VISTARIL) 25 MG tablet Take 1 tablet (25 mg total) by mouth 3 (three) times daily as needed for anxiety. 12/13/15   Harvest Dark, MD  lisinopril (PRINIVIL,ZESTRIL) 5 MG tablet Take 1 tablet (5 mg total) by mouth daily. 06/21/15   Bettey Costa, MD  nitroGLYCERIN (NITROSTAT) 0.4 MG SL tablet Place 1 tablet (0.4 mg total) under the tongue every 5 (five) minutes x 3 doses as needed for chest pain. 06/21/15   Sital Mody, MD  OLANZapine (ZYPREXA) 5 MG tablet Take 1 tablet (5 mg total) by mouth at bedtime. 03/26/16 04/02/16  Delman Kitten, MD  ondansetron (ZOFRAN ODT) 4 MG disintegrating tablet Take 1 tablet (4 mg total) by mouth every 6 (six) hours as needed for nausea or vomiting. Patient not taking: Reported on 11/24/2015 09/04/15   Delman Kitten, MD  prasugrel (EFFIENT) 10 MG TABS tablet Take 10 mg by mouth daily.    Historical Provider, MD  promethazine (PHENERGAN) 12.5 MG tablet Take 1 tablet (12.5 mg total) by mouth every 6 (six) hours as needed for nausea or vomiting. 12/06/15   Drenda Freeze, MD  propranolol (INDERAL) 10 MG tablet Take 10 mg by mouth 2 (two) times daily.    Historical Provider, MD  QUEtiapine (SEROQUEL) 100 MG tablet Take 100 mg by mouth at bedtime.    Historical Provider, MD    vortioxetine HBr (TRINTELLIX) 10 MG TABS Take 1 tablet by mouth daily.    Historical Provider, MD    Allergies Prednisone; Hydrocodone; and Varenicline  Family History  Problem Relation Age of Onset  . Prostate cancer Father   . Depression Father   . Hypertension Mother   . Kidney Stones Mother   . Anxiety disorder Mother   . Depression Mother   . COPD Sister   . Hypertension Sister   . Diabetes Sister   . Depression Sister   . Anxiety disorder Sister   . Seizures Sister     Social History Social History  Substance Use Topics  . Smoking status: Current Every Day Smoker    Packs/day: 1.50    Years: 17.00    Types: Cigarettes  . Smokeless tobacco: Never Used  . Alcohol use 7.2 oz/week    12 Cans of beer per week     Comment: beer- 1-2 x's per month  Review of Systems Level 5 caveat:  history/ROS limited by acute/critical illness  ____________________________________________   PHYSICAL EXAM:  VITAL SIGNS: ED Triage Vitals  Enc Vitals Group     BP 04/05/16 0241 114/69     Pulse Rate 04/05/16 0241 67     Resp 04/05/16 0241 20     Temp 04/05/16 0241 97.5 F (36.4 C)     Temp Source 04/05/16 0241 Oral     SpO2 --      Weight 04/05/16 0242 200 lb (90.7 kg)     Height 04/05/16 0242 6\' 2"  (1.88 m)     Head Circumference --      Peak Flow --      Pain Score 04/05/16 0242 10     Pain Loc --      Pain Edu? --      Excl. in [place]? --     Constitutional: Alert and oriented. Ill appearing, pale, diaphoretic.   Eyes: Conjunctivae are normal. PERRL. EOMI. Head: Atraumatic. Nose: No congestion/rhinnorhea. Mouth/Throat: Mucous membranes are moist.  Oropharynx non-erythematous. Neck: No stridor.  No meningeal signs.   Cardiovascular: Normal rate, regular rhythm. Good peripheral circulation. Grossly normal heart sounds. Respiratory: Normal respiratory effort.  No retractions. Lungs CTAB. Gastrointestinal: Soft and nontender. No distention.  Musculoskeletal: No  lower extremity tenderness nor edema. No gross deformities of extremities. Neurologic:  Normal speech and language. No gross focal neurologic deficits are appreciated.  Skin:  Skin is pale, warm, diaphoretic, and intact. No rash noted. Psychiatric: Mood and affect are normal. Speech and behavior are normal.  ____________________________________________   LABS (all labs ordered are listed, but only abnormal results are displayed)  Labs Reviewed  TROPONIN I - Abnormal; Notable for the following:       Result Value   Troponin I 0.08 (*)    All other components within normal limits  DIFFERENTIAL - Abnormal; Notable for the following:    Neutro Abs 8.1 (*)    Basophils Absolute 0.3 (*)    All other components within normal limits  CBC - Abnormal; Notable for the following:    WBC 12.6 (*)    All other components within normal limits  LIPASE, BLOOD  PROTIME-INR  APTT  COMPREHENSIVE METABOLIC PANEL  LIPID PANEL   ____________________________________________  EKG  ED ECG REPORT I, Race Latour, the attending physician, personally viewed and interpreted this ECG.  Date: 04/05/2016 EKG Time: 02:43 Rate: 64 Rhythm: normal sinus rhythm QRS Axis: LAD Intervals: normal ST/T Wave abnormalities: acute STEMI with marked elevation in I, aVL, V2-V6 and reciprocal changes in III and aVF Conduction Disturbances: none Narrative Interpretation: acute STEMI  ____________________________________________  RADIOLOGY   No results found.  ____________________________________________   PROCEDURES  Procedure(s) performed:   (defibrillation) .Cardioversion Date/Time: 04/05/2016 3:05 AM Performed by: Hinda Kehr Authorized by: Hinda Kehr   Consent:    Consent obtained:  Emergent situation Pre-procedure details:    Cardioversion basis:  Emergent   Pre-procedure rhythm: ventricular fibrillation. Attempt one:    Cardioversion mode:  Asynchronous   Waveform:  Biphasic   Shock  (Joules):  200   Shock outcome:  Conversion to normal sinus rhythm .Critical Care Performed by: Hinda Kehr Authorized by: Hinda Kehr   Critical care provider statement:    Critical care time (minutes):  30   Critical care time was exclusive of:  Separately billable procedures and treating other patients   Critical care was necessary to treat or prevent imminent or life-threatening deterioration of  the following conditions:  Cardiac failure and circulatory failure   Critical care was time spent personally by me on the following activities:  Development of treatment plan with patient or surrogate, discussions with consultants, evaluation of patient's response to treatment, examination of patient, obtaining history from patient or surrogate, ordering and performing treatments and interventions, ordering and review of laboratory studies, ordering and review of radiographic studies, pulse oximetry, re-evaluation of patient's condition and review of old charts     Critical Care performed: Yes, see critical care procedure note(s) ____________________________________________   INITIAL IMPRESSION / [place] / ED COURSE  Pertinent labs & imaging results that were available during my care of the patient were reviewed by me and considered in my medical decision making (see chart for details).   Clinical Course as of Apr 05 330  Wed Apr 05, 2016  0253 Identified STEMI from triage EKG.  Patient brought to room #12 immediately.  Saw patient, confirmed three prior stents, tobacco use, non-compliance with all medications including no ASA use.  Has been to [facility] before.  Awaitin callback from [facility] STEMI doc  [CF]  0301 Patient had v-fib arrest while I was talking to cardiology.  Defib at 200 J, immediate return of normal sinus rhythm.  Giving amiodarone 300 mg IVP.  Finished conversation with Dr. Tamala Julian at [facility] who accepted patient.  Critical care truck from [place] on the way.  [CF]    0303 Dr. Tamala Julian did not want any additional medications (such as Brilinta).  [CF]    Clinical Course User Index [CF] Hinda Kehr, MD    ____________________________________________  FINAL CLINICAL IMPRESSION(S) / ED DIAGNOSES  Final diagnoses:  ST elevation myocardial infarction (STEMI), unspecified artery [facility])  Cardiac arrest with ventricular fibrillation ([place])     MEDICATIONS GIVEN DURING THIS VISIT:  Medications  0.9 %  sodium chloride infusion (999 mL/hr Intravenous Transfusing/Transfer 04/05/16 0325)  aspirin chewable tablet 324 mg (324 mg Oral Given 04/05/16 0253)  heparin injection 4,000 Units (4,000 Units Intravenous Given 04/05/16 0253)  amiodarone (NEXTERONE) IV bolus only 150 mg/100 mL (0 mg Intravenous Stopped 04/05/16 0317)  morphine 4 MG/ML injection 4 mg (4 mg Intravenous Given 04/05/16 0311)  ondansetron (ZOFRAN) injection 4 mg (4 mg Intravenous Given 04/05/16 0311)     NEW OUTPATIENT MEDICATIONS STARTED DURING THIS VISIT:  New Prescriptions   No medications on file    Modified Medications   No medications on file    Discontinued Medications   No medications on file     Note:  This document was prepared using Dragon voice recognition software and may include unintentional dictation errors.    Hinda Kehr, MD 04/05/16 [phone]

## 2016-04-05 NOTE — Care Management Note (Addendum)
Case Management Note  Patient Details  Name: KNOX CERVI MRN: [id number] Date of Birth: June 15, 1970  Subjective/Objective:     Adm w mi             Action/Plan:lives at home, pcp dr Humphrey Rolls   Expected Discharge Date:                  Expected Discharge Plan:  Home/Self Care  In-House Referral:     Discharge planning Services  CM Consult, Medication Assistance  Post Acute Care Choice:    Choice offered to:     DME Arranged:    DME Agency:     HH Arranged:    [place] Agency:     Status of Service:  In process, will continue to follow  If discussed at Long Length of Stay Meetings, dates discussed:    Additional Comments:gave pt 30day free brilinta card. [facility] medicare ins for meds.S/W TINA @ [employer] RX # [phone]   BRILINTA  90 MG BID   COVER- YES   PATIENT A LEVEL- 2 LOWE INCOME  MEDICAID PAY $ 3.70 FOR [address]   Lacretia Leigh, RN 04/05/2016, 10:13 AM

## 2016-04-05 NOTE — CV Procedure (Signed)
   Presentation: Anterior ST elevation MI, secondary to acute stent thrombosis.  Results: Angiography demonstrated thrombosis with partial reperfusion of the proximal segment of the LAD stent. Obtuse marginal stent is patent. Proximal obtuse marginal is 70-80% obstructed. Right coronary including the previously placed stent is patent.  Intervention: Based Thrombectomy, Followed by Angioplasty, Followed by Overlapped Stenting of the Proximal LAD Stent Margin, Followed by Intravascular Ultrasound to Document and Confirm Good Stent Apposition.  Complications: None

## 2016-04-05 NOTE — ED Notes (Signed)
[facility]STER [facility] [phone] CALLED TO MAKE AWARE OF TRANSFER TO [facility]  STILL AT [facility] NO ANSWER VOICEMAIL LEFT.  CHARGE NURSE MADE AWARE.

## 2016-04-05 NOTE — ED Notes (Signed)
Report given to [facility] RN IN THE CATH LAB AT [place].

## 2016-04-05 NOTE — Progress Notes (Signed)
Patient Name: Michael Schmitt Date of Encounter: 04/05/2016  Primary Cardiologist: Dr Tamala Julian did cath, fellow did H&P  Hospital Problem List     Principal Problem:   Acute ST elevation myocardial infarction (STEMI) involving left anterior descending (LAD) coronary artery [facility]) Active Problems:   Clinical depression   Acid reflux   Generalized anxiety disorder   Coronary stent thrombosis     Subjective   Pt has significant anxiety. Pt is having central CP, Right CP, back pain. He is concerned about not being on his home rx. Smokes heavily. Drinks a lot of beer, but says he will not have DTs. Says he cannot quit drinking and smoking due to depression/anxiety. Depression/anxiety are severe for him.  Inpatient Medications    Scheduled Meds: . [START ON 04/06/2016] aspirin EC  81 mg Oral Daily  . atorvastatin  80 mg Oral q1800  . carvedilol  3.125 mg Oral BID WC  . FLUoxetine  20 mg Oral QHS  . glimepiride  1 mg Oral Q breakfast  . heparin  5,000 Units Subcutaneous Q8H  . lisinopril  5 mg Oral Daily  . mirtazapine  30 mg Oral QHS  . pantoprazole  40 mg Oral Daily  . QUEtiapine  100 mg Oral QHS  . sodium chloride flush  3 mL Intravenous Q12H  . ticagrelor  90 mg Oral BID   Continuous Infusions: . sodium chloride 1 mL/kg/hr (04/05/16 0800)  . tirofiban 0.15 mcg/kg/min (04/05/16 0800)   PRN Meds: sodium chloride, acetaminophen, ALPRAZolam, clonazePAM, hydrALAZINE, labetalol, morphine injection, nitroGLYCERIN, ondansetron (ZOFRAN) IV, sodium chloride flush   Vital Signs    Vitals:   04/05/16 0645 04/05/16 0700 04/05/16 0812 04/05/16 0819  BP: (!) 149/93 (!) 137/95 (!) 144/107   Pulse: 72 78    Resp: 17 16    Temp:    98.2 F (36.8 C)  TempSrc:    Oral  SpO2: 97% 97%    Weight:      Height:        Intake/Output Summary (Last 24 hours) at 04/05/16 1033 Last data filed at 04/05/16 0800  Gross per 24 hour  Intake           408.55 ml  Output             1200 ml    Net          -791.45 ml   Filed Weights   04/05/16 0545  Weight: 191 lb 2.2 oz (86.7 kg)    Physical Exam    GEN: Well nourished, well developed, anxious, uncomfortable.  HEENT: Grossly normal.  Neck: Supple, no JVD, carotid bruits, or masses. Cardiac: RRR, no murmurs, rubs, or gallops. No clubbing, cyanosis, edema.  Radials/DP/PT 2+ and equal bilaterally. TR band still in place but deflated Respiratory:  Respirations regular and unlabored, clear to auscultation bilaterally. GI: Soft, nontender, nondistended, BS + x 4. MS: no deformity or atrophy. Skin: warm and dry, no rash. Neuro:  Strength and sensation are intact. Psych: AAOx3.  Normal affect.  Labs    CBC  Recent Labs  04/05/16 0246 04/05/16 0555  WBC 12.6* 17.1*  NEUTROABS 8.1*  --   HGB 16.9 16.9  HCT 49.7 49.9  MCV 90.5 91.9  PLT 418 465   Basic Metabolic Panel  Recent Labs  04/05/16 0246 04/05/16 0555  NA 140 136  K 3.8 5.2*  CL 102 103  CO2 27 24  GLUCOSE 151* 115*  BUN 11 8  CREATININE 1.21 1.03  CALCIUM 9.8 9.5   Liver Function Tests  Recent Labs  04/05/16 0246  AST 56*  ALT 47  ALKPHOS 100  BILITOT 0.5  PROT 7.5  ALBUMIN 4.1    Recent Labs  04/05/16 0246  LIPASE 27   Cardiac Enzymes  Recent Labs  04/05/16 0246 04/05/16 0555  TROPONINI 0.08* 2.34*   Hemoglobin A1C ordered  Fasting Lipid Panel  Recent Labs  04/05/16 0246  CHOL 246*  HDL 42  LDLCALC UNABLE TO REPORT DUE TO LIPEMIC INTERFERENCE  TRIG 860*  CHOLHDL 5.9    Telemetry    SR, ST, rare ectopy - Personally Reviewed  ECG    01/10 at 5:34 am, SR but still w/ ST elevation - Personally Reviewed  Radiology    No results found.  Cardiac Studies   CATH: 01/10  Acute anterior ST elevation myocardial infarction from acute LAD stent thrombosis.  Widely patent second obtuse marginal and right coronary stents.  75-80% stenosis in the ostium of the second obtuse marginal.  95-99% proximal LAD  in-stent restenosis and stent thrombosis with heavy thrombus burden noted by angiography. Apical occlusion of the LAD due to distal embolization from the proximal vessel.  Successful PTCA followed by 12 x 3.5 mm Synergy overlap stenting of the previous proximal LAD stent. Stenosis from 99% to 0% with TIMI grade 3 flow. The apical LAD could not be recanalized with "Dotter".  LVEF 45-50% with apical akinesis noted. EDP 19 mmHg RECOMMENDATIONS:  Aggrastat 18 hours  Aspirin and Brilinta. After 12 months of Brilinta, switch to Plavix which should continue indefinitely.  Low-dose ACE inhibitor and beta blocker therapy  Cardiac rehabilitation  Aggressive statin management of lipids  Aggressive management of diabetes and tobacco use. Post-Intervention Diagram       Patient Profile      46 y o man with a PMH of diabetes, CAD (s/p multiple stents in 2016), anxiety and depression now presents with Stemi from [place]. In the ED he went into [facility]F twice. Required 2 DCCV with immediate ROSC. 300 Amio given. Also Heparin and ASA 325. S/p cath w/ DES prox LAD  Assessment & Plan    Principal Problem: 1.  Acute ST elevation myocardial infarction (STEMI) involving left anterior descending (LAD) coronary artery (HCC) - S/P cath w/ DES pLAD, overlapping previous stent - distal embolization may be responsible for his ongoing pain, will add IV NTG, morphine  Active Problems: 2.   Ventricular fib: - happened at [place] in setting of STEMI - follow on telemetry - increase BB  3.  Clinical depression - home rx  4.  Acid reflux - on Protonix  5.  Generalized anxiety disorder - on home rx - if sx not controlled, may need Psych consult  6.  Coronary stent thrombosis - see above  7. Back pain - disc problems, so is chronic - try Lidocaine patch  8. Tobacco use - nicotine patch  9. ETOH abuse - pt states will not go into DTs - watch for this  Signed, Rosaria Ferries, PA-C    04/05/2016, 10:33 AM   History and all data above reviewed.  Patient examined.  I agree with the findings as above.  Complains of cervical neck pain and chronic back pain.  Also with some residual chest discomfort.  He has chronic pain.  The patient exam reveals COR:RRR, no rub  ,  Lungs: Clear  ,  Abd: Positive bowel sounds, no rebound no guarding, Ext No  edema  .  All available labs, radiology testing, previous records reviewed. Agree with documented assessment and plan. Continue care as above.   Jeneen Rinks Marnie Fazzino  1:57 PM  04/05/2016

## 2016-04-05 NOTE — H&P (Signed)
History & Physical    Patient ID: Michael Schmitt MRN: [id number], DOB/AGE: Sep 07, 1970   Admit date: 04/05/2016   Primary Physician: [name], MD Primary Cardiologist: Not following  Patient Profile    Mr Michael Schmitt is a 46 y o man with a PMH of diabetes, CAD (s/p multiple stents in 2016), anxiety and depression now presents with Stemi from [place]. He developed CP late last night. EMS came to his house earlier and determined that patient was doing ok. On a second EMS call he was then brought in to ED where ST elevations were diagnosed. This was a repeat episode of CP. He had recurrent CP over last months with an ED visit late last year. No recent stress tests or caths since 2016. He aslo had numerous Ed visits for psychiatric issues and admits to have stopped taking all his CV meds shortly after the stents were placed. He lists side effects such as "not feeling well" as one reason for it.   In the ED he went into [facility] twice. Required 2 DCCV with immediate ROSC. 300 Amio given. Also Heparin and ASA 325 given.   ON arrival to [facility] still in pain. Persistent ST elevations.     2016 cath : Colon Flattery 2nd Mrg to 2nd Mrg lesion, 50% stenosed.  Prox RCA to Mid RCA lesion, 85% stenosed. Post intervention, there is a 0% residual stenosis.  2nd Mrg lesion, 90% stenosed. Post intervention, there is a 0% residual stenosis.  Prox LAD lesion, 95% stenosed. Post intervention, there is a 0% residual stenosis.   TTE with preserved LVEF  Past Medical History    Past Medical History:  Diagnosis Date  . Anxiety   . Anxiety   . Arthritis    cerv. stenosis, spondylosis, HNP- lower back , has been followed in pain clinic, has  had injection s in cerv. area  . Blood dyscrasia    told that when he was young he was a" free bleeder"  . CAD (coronary artery disease)   . Cervical spondylosis without myelopathy 07/24/2014  . Cervicogenic headache 07/24/2014  . Chronic kidney disease    renal calculi-  passed spontaneously  . Depression   . Diabetes mellitus without complication ([place])   . Fatty liver   . GERD (gastroesophageal reflux disease)   . Headache(784.0)   . Hypertension   . Mental disorder   . MI, old   . RLS (restless legs syndrome)    detected on sleep study  . Shortness of breath     Past Surgical History:  Procedure Laterality Date  . ANTERIOR CERVICAL DECOMP/DISCECTOMY FUSION  11/13/2011   Procedure: ANTERIOR CERVICAL DECOMPRESSION/DISCECTOMY FUSION 1 LEVEL;  Surgeon: [name], MD;  Location: [place] [facility];  Service: Neurosurgery;  Laterality: N/A;  Anterior Cervical Decompression/discectomy Fusion. Cervical three-four.  Marland Kitchen CARDIAC CATHETERIZATION N/A 01/01/2015   Procedure: Left Heart Cath and Coronary Angiography;  Surgeon: [name], MD;  Location: [place] [facility];  Service: Cardiovascular;  Laterality: N/A;  . NASAL SINUS SURGERY     2005     Allergies  Allergies  Allergen Reactions  . Prednisone Other (See Comments)    Hypertension, makes him feel spacey  . Hydrocodone Other (See Comments)    Headache, irritable  . Varenicline Other (See Comments)    Suicidal thoughts     Home Medications    Prior to Admission medications   Medication Sig Start Date End Date Taking? Authorizing Provider  amoxicillin-clavulanate (AUGMENTIN)  875-125 MG tablet Take 1 tablet by mouth 2 (two) times daily. 03/26/16   Delman Kitten, MD  atorvastatin (LIPITOR) 80 MG tablet Take 1 tablet (80 mg total) by mouth daily at 6 PM. 06/21/15   Bettey Costa, MD  clonazePAM (KLONOPIN) 1 MG tablet Take 1 tablet (1 mg total) by mouth 3 (three) times daily as needed for anxiety. 03/26/16 04/02/16  Delman Kitten, MD  dexlansoprazole (DEXILANT) 60 MG capsule Take 60 mg by mouth daily.    Historical Provider, MD  doxepin (SINEQUAN) 50 MG capsule Take 50 mg by mouth at bedtime as needed.    Historical Provider, MD  esomeprazole (NEXIUM) 40 MG capsule Take 40 mg by mouth daily at 12 noon.     Historical Provider, MD  FLUoxetine (PROZAC) 20 MG capsule Take 1 capsule (20 mg total) by mouth at bedtime. 03/26/16 04/02/16  Delman Kitten, MD  glimepiride (AMARYL) 1 MG tablet Take 1 tablet (1 mg total) by mouth every morning. Patient not taking: Reported on 11/24/2015 09/04/15 09/03/16  Delman Kitten, MD  hydrOXYzine (ATARAX/VISTARIL) 25 MG tablet Take 1 tablet (25 mg total) by mouth 3 (three) times daily as needed for anxiety. 12/13/15   Harvest Dark, MD  lisinopril (PRINIVIL,ZESTRIL) 5 MG tablet Take 1 tablet (5 mg total) by mouth daily. 06/21/15   Bettey Costa, MD  nitroGLYCERIN (NITROSTAT) 0.4 MG SL tablet Place 1 tablet (0.4 mg total) under the tongue every 5 (five) minutes x 3 doses as needed for chest pain. 06/21/15   Sital Mody, MD  OLANZapine (ZYPREXA) 5 MG tablet Take 1 tablet (5 mg total) by mouth at bedtime. 03/26/16 04/02/16  Delman Kitten, MD  ondansetron (ZOFRAN ODT) 4 MG disintegrating tablet Take 1 tablet (4 mg total) by mouth every 6 (six) hours as needed for nausea or vomiting. Patient not taking: Reported on 11/24/2015 09/04/15   Delman Kitten, MD  prasugrel (EFFIENT) 10 MG TABS tablet Take 10 mg by mouth daily.    Historical Provider, MD  promethazine (PHENERGAN) 12.5 MG tablet Take 1 tablet (12.5 mg total) by mouth every 6 (six) hours as needed for nausea or vomiting. 12/06/15   Drenda Freeze, MD  propranolol (INDERAL) 10 MG tablet Take 10 mg by mouth 2 (two) times daily.    Historical Provider, MD  QUEtiapine (SEROQUEL) 100 MG tablet Take 100 mg by mouth at bedtime.    Historical Provider, MD  vortioxetine HBr (TRINTELLIX) 10 MG TABS Take 1 tablet by mouth daily.    Historical Provider, MD    Family History    Family History  Problem Relation Age of Onset  . Prostate cancer Father   . Depression Father   . Hypertension Mother   . Kidney Stones Mother   . Anxiety disorder Mother   . Depression Mother   . COPD Sister   . Hypertension Sister   . Diabetes Sister   . Depression  Sister   . Anxiety disorder Sister   . Seizures Sister     Social History    Social History   Social History  . Marital status: Widowed    Spouse name: N/A  . Number of children: 3  . Years of education: 12   Occupational History  .      unemployed   Social History Main Topics  . Smoking status: Current Every Day Smoker    Packs/day: 1.50    Years: 17.00    Types: Cigarettes  . Smokeless tobacco: Never Used  .  Alcohol use 7.2 oz/week    12 Cans of beer per week     Comment: beer- 1-2 x's per month   . Drug use: No  . Sexual activity: Not Currently   Other Topics Concern  . Not on file   Social History Narrative   Patient is right handed.   Patient drinks 2 sodas daily.     Review of Systems    General:  No chills, fever, night sweats or weight changes.  Cardiovascular:  No chest pain, dyspnea on exertion, edema, orthopnea, palpitations, paroxysmal nocturnal dyspnea. Dermatological: No rash, lesions/masses Respiratory: No cough, dyspnea Urologic: No hematuria, dysuria Abdominal:   No nausea, vomiting, diarrhea, bright red blood per rectum, melena, or hematemesis Neurologic:  No visual changes, wkns, changes in mental status. All other systems reviewed and are otherwise negative except as noted above.  Physical Exam    There were no vitals taken for this visit.  General: in pain Psych: Normal affect. Neuro: Alert and oriented X 3. Moves all extremities spontaneously. HEENT: Normal  Neck: Supple without bruits or JVD. Lungs:  Resp regular and unlabored, CTA. Heart: tachycardic Abdomen: Soft, non-tender, non-distended, BS + x 4.  Extremities: No clubbing, cyanosis or edema. DP/PT/Radials 2+ and equal bilaterally.  Labs    Troponin (Point of Care Test) No results for input(s): TROPIPOC in the last 72 hours.  Recent Labs  04/05/16 0246  TROPONINI 0.08*   Lab Results  Component Value Date   WBC 12.6 (H) 04/05/2016   HGB 16.9 04/05/2016   HCT 49.7  04/05/2016   MCV 90.5 04/05/2016   PLT PENDING 04/05/2016    Recent Labs Lab 04/05/16 0246  NA 140  K 3.8  CL 102  CO2 27  BUN 11  CREATININE 1.21  CALCIUM 9.8  PROT 7.5  BILITOT 0.5  ALKPHOS 100  ALT 47  AST 56*  GLUCOSE 151*   Lab Results  Component Value Date   CHOL 246 (H) 04/05/2016   HDL 42 04/05/2016   LDLCALC UNABLE TO REPORT DUE TO LIPEMIC INTERFERENCE 04/05/2016   TRIG 860 (H) 04/05/2016   No results found for: [facility]   Radiology Studies    Dg Chest 2 View  Result Date: 03/26/2016 CLINICAL DATA:  Cough and chest congestion and shortness of breath. EXAM: CHEST  2 VIEW COMPARISON:  11/24/2015 FINDINGS: Heart size and pulmonary vascularity are normal. Chronic or recurrent peribronchial thickening. No acute infiltrates or effusions. Bones are normal. IMPRESSION: Chronic or recurrent bronchitic changes. Electronically Signed   By: Lorriane Shire M.D.   On: 03/26/2016 10:29    ECG & Cardiac Imaging    Sinus tach with St elevation in lateral leads.  Assessment & Plan    Mr Sells is a 56 y o man with hyperlipidemia, diabetes and CAD. He presents with STEMI. Suspicious for LAD in stent thrombosis. Cause of his presentation is likely non adherence to medical therapy .  Plan: - [facility] - Will restart on home meds which he has not been taking including prasugrel, BB, ACEI, statin and ASA.  Signed, Cristina Gong, MD 04/05/2016, 3:55 AM

## 2016-04-05 NOTE — ED Notes (Signed)
CODE STEMI CALLED AT THIS TIME

## 2016-04-05 NOTE — ED Notes (Signed)
ekg done stat and pt taken to room 12 from triage.

## 2016-04-05 NOTE — ED Notes (Signed)
PT HAD TWO SEPARATE DOSE OF 150MG  BOLUS PER DR. [facility] SEE NOTES IN MAR. PT IS ALERT AND RESTING. NSR WITH ST ELEVATION. AWAITING TRANPORT.

## 2016-04-05 NOTE — ED Notes (Signed)
Report given to [facility].

## 2016-04-05 NOTE — Progress Notes (Signed)
CRITICAL VALUE ALERT  Critical value received:  Troponin 17.08  Date of notification:  04/05/16  Time of notification:  12.54  Critical value read back:Yes.    Nurse who received alert:  Stann Ore   MD notified (1st page):  Suanne Marker, [place]  Time of first page:  1335  MD notified (2nd page):  Time of second page:  Responding MD:  Suanne Marker, [place]  Time MD responded:  [phone]

## 2016-04-05 NOTE — ED Triage Notes (Addendum)
Pt brought in via ems from home with chest pain since 2030 tonight.  Pain in center of chest.   Pt belching in triage.  Pt has reflux.  No sob.   Pt pale and diaphoetic Pt alert.

## 2016-04-06 LAB — BASIC METABOLIC PANEL
ANION GAP: 12 (ref 5–15)
BUN: 10 mg/dL (ref 6–20)
CHLORIDE: 99 mmol/L — AB (ref 101–111)
CO2: 22 mmol/L (ref 22–32)
Calcium: 8.6 mg/dL — ABNORMAL LOW (ref 8.9–10.3)
Creatinine, Ser: 0.96 mg/dL (ref 0.61–1.24)
GFR calc non Af Amer: >60 mL/min (ref >60)
GLUCOSE: 177 mg/dL — AB (ref 65–99)
Potassium: 3.8 mmol/L (ref 3.5–5.1)
Sodium: 133 mmol/L — ABNORMAL LOW (ref 135–145)

## 2016-04-06 LAB — HEMOGLOBIN A1C
HEMOGLOBIN A1C: 5.9 % — AB (ref 4.8–5.6)
MEAN PLASMA GLUCOSE: 123 mg/dL

## 2016-04-06 LAB — PLATELET COUNT
PLATELETS: 239 10*3/uL (ref 150–400)
PLATELETS: 229 10*3/uL (ref 150–400)
PLATELETS: 202 10*3/uL (ref 150–400)
Platelets: 258 10*3/uL (ref 150–400)

## 2016-04-06 LAB — TROPONIN I
TROPONIN I: 22.89 ng/mL — AB (ref <0.03)
TROPONIN I: 16.8 ng/mL — AB (ref <0.03)
Troponin I: 39.4 ng/mL (ref <0.03)
Troponin I: 24.91 ng/mL (ref <0.03)

## 2016-04-06 MED ORDER — SALINE SPRAY 0.65 % NA SOLN
1.0000 | NASAL | Status: DC | PRN
  Filled 2016-04-06: qty 44

## 2016-04-06 MED ORDER — GUAIFENESIN ER 600 MG PO TB12
600.0000 mg | ORAL_TABLET | Freq: Two times a day (BID) | ORAL | Status: DC
  Administered 2016-04-06 – 2016-04-07 (×2): 600 mg via ORAL
  Filled 2016-04-06: qty 1

## 2016-04-06 MED ORDER — CARVEDILOL 6.25 MG PO TABS
6.2500 mg | ORAL_TABLET | Freq: Two times a day (BID) | ORAL | Status: DC
  Administered 2016-04-06 – 2016-04-07 (×2): 6.25 mg via ORAL
  Filled 2016-04-06 (×2): qty 1

## 2016-04-06 MED ORDER — LORATADINE 10 MG PO TABS
10.0000 mg | ORAL_TABLET | Freq: Every day | ORAL | Status: DC
  Administered 2016-04-07: 10 mg via ORAL
  Filled 2016-04-06: qty 1

## 2016-04-06 NOTE — Progress Notes (Signed)
EKG CRITICAL VALUE     12 lead EKG performed.  Critical value noted.  Jacqlyn Larsen, RN notified.   Martie Lee, CCT 04/06/2016 7:17 AM

## 2016-04-06 NOTE — Progress Notes (Signed)
Pt complaining of soreness in chest with inspiration as well as sore throat, stuffy nose and pressure in his ears. EKG obtained. ST elevation noted though less prominent than prior EKG, EKG relayed to PA Barrett. Order given for nasal spray and Claritin daily. Will continue to monitor.

## 2016-04-06 NOTE — Progress Notes (Signed)
CARDIAC REHAB PHASE I   PRE:  Rate/Rhythm: 98 SR    BP: sitting 128/58    SaO2: 96 RA  MODE:  Ambulation: 940 ft   POST:  Rate/Rhythm: 108 ST    BP: sitting 132/90     SaO2: 96 RA  Pt thankful to get up. Able to walk without problems except neck pain. Anxious about his HR being elevated.  To recliner, comfortable. Ed began including MI, stent, restrictions, smoking cessation, Brilinta, NTG, and CRPII. Pt has many stressors in his life. He is raising his children, is facing jail time for DWIs, and may be homeless soon. He is very concerned about pain meds. Sts he can't imagine quitting smoking but is thinking about quitting drinking. I gave him a fake cigarette. Encouraged coping strategies. Will refer to [place].  [place], ACSM 04/06/2016 1:36 PM

## 2016-04-06 NOTE — Progress Notes (Addendum)
Patient Name: Michael Schmitt Date of Encounter: 04/06/2016  Primary Cardiologist: Dr. Laurena Bering Problem List     Principal Problem:   Acute ST elevation myocardial infarction (STEMI) involving left anterior descending (LAD) coronary artery [facility]) Active Problems:   Clinical depression   Acid reflux   Generalized anxiety disorder   Coronary stent thrombosis   Ventricular fibrillation (HCC)     Subjective   Complaining of headache and neck and back pain. Not complaining of his previous chest pain.   Inpatient Medications    Scheduled Meds: . aspirin EC  81 mg Oral Daily  . atorvastatin  80 mg Oral q1800  . carvedilol  3.125 mg Oral BID WC  . FLUoxetine  20 mg Oral QHS  . glimepiride  1 mg Oral Q breakfast  . heparin  5,000 Units Subcutaneous Q8H  . lidocaine  1 patch Transdermal Q24H  . lisinopril  5 mg Oral Daily  . mirtazapine  30 mg Oral QHS  . nicotine  21 mg Transdermal Daily  . pantoprazole  40 mg Oral Daily  . sodium chloride flush  3 mL Intravenous Q12H  . ticagrelor  90 mg Oral BID   Continuous Infusions: . nitroGLYCERIN 5 mcg/min (04/06/16 0800)   PRN Meds: sodium chloride, acetaminophen, ALPRAZolam, clonazePAM, morphine injection, nitroGLYCERIN, ondansetron (ZOFRAN) IV, sodium chloride flush   Vital Signs    Vitals:   04/06/16 0630 04/06/16 0700 04/06/16 0800 04/06/16 0804  BP:  116/73 126/86   Pulse:  (!) 102 100   Resp:  (!) 21 17   Temp: 99.3 F (37.4 C)   98.8 F (37.1 C)  TempSrc: Oral   Oral  SpO2:  94% 95%   Weight:      Height:        Intake/Output Summary (Last 24 hours) at 04/06/16 0915 Last data filed at 04/06/16 0800  Gross per 24 hour  Intake          1083.66 ml  Output              450 ml  Net           633.66 ml   Filed Weights   04/05/16 0545  Weight: 191 lb 2.2 oz (86.7 kg)    Physical Exam    GEN: NAD.  Neck:  No  JVD Cardiac: Regular Rate and Rhythm, no murmurs, rubs, or gallops.  No edema.   Radials/DP/PT 2+ without hematoma at the right radial and equal bilaterally.  Respiratory:  Respirations  regular and unlabored, clear to auscultation bilaterally. GI: Soft, nontender, nondistended, BS + x 4. Skin: warm and dry, no rash. Neuro:   Strength and sensation are intact. Psych:  AAOx3.  Normal affect.  Labs    CBC  Recent Labs  04/05/16 0246 04/05/16 0555  04/06/16 0010 04/06/16 0727  WBC 12.6* 17.1*  --   --   --   NEUTROABS 8.1*  --   --   --   --   HGB 16.9 16.9  --   --   --   HCT 49.7 49.9  --   --   --   MCV 90.5 91.9  --   --   --   PLT 418 302  < > 258 239  < > = values in this interval not displayed. Basic Metabolic Panel  Recent Labs  04/05/16 1803 04/06/16 0727  NA 135 133*  K 3.7 3.8  CL 102 99*  CO2 22 22  GLUCOSE 149* 177*  BUN 10 10  CREATININE 0.97 0.96  CALCIUM 9.3 8.6*   Liver Function Tests  Recent Labs  04/05/16 0246  AST 56*  ALT 47  ALKPHOS 100  BILITOT 0.5  PROT 7.5  ALBUMIN 4.1    Recent Labs  04/05/16 0246  LIPASE 27   Cardiac Enzymes  Recent Labs  04/05/16 1803 04/06/16 0010 04/06/16 0727  TROPONINI 31.74* 39.40* 24.91*   BNP Invalid input(s): POCBNP D-Dimer No results for input(s): DDIMER in the last 72 hours. Hemoglobin A1C  Recent Labs  04/05/16 0555  HGBA1C 5.9*   Fasting Lipid Panel  Recent Labs  04/05/16 0246  CHOL 246*  HDL 42  LDLCALC UNABLE TO REPORT DUE TO LIPEMIC INTERFERENCE  TRIG 860*  CHOLHDL 5.9   Thyroid Function Tests No results for input(s): TSH, T4TOTAL, T3FREE, THYROIDAB in the last 72 hours.  Invalid input(s): FREET3  Telemetry     NSVT, NSR - Personally Reviewed  ECG    NSR, rate 121, evolving anterior MI, acute injury pattern is resolved, QT prolonged. . - Personally Reviewed  Radiology    No results found.  Cardiac Studies   ECHO:  - Left ventricle: The cavity size was normal. Systolic function was   mildly reduced. The estimated ejection fraction  was in the range   of 45% to 50%. There is akinesis of the apical myocardium.   Doppler parameters are consistent with abnormal left ventricular   relaxation (grade 1 diastolic dysfunction). - Tricuspid valve: There was trivial regurgitation.   Patient Profile     46 y o man with a PMH of diabetes, CAD (s/p multiple stents in 2016), anxiety and depression now presents with Stemi from [place]. In the ED he went into VF twice. Required 2 DCCV with immediate ROSC. 300 Amio given. Also Heparin and ASA 325. S/p cath w/ DES prox LAD  Assessment & Plan    ACUTE ANTERIOR MI:  PCI with DES to previously stented LAD.    EF is slightly low compared with previous.  Medical management. Complains of neck pain and headache.   Stop ASA.   Stop IV NTG.  Cardiac rehab.    VENTRICULAR FIB:   Runs of NSVT.  Will increased beta blocker.   TOBACCO ABUSE:  Educated.  He is unlikely to quit.   ETOH ABUSE:   Educated.  He is unlikely to quit.   CHRONIC PAIN:   He was advised not to use Goodies Powder and to follow up as an out patient with cardiac rehab.   Signed, Minus Breeding, MD  04/06/2016, 9:15 AM

## 2016-04-07 LAB — TROPONIN I
TROPONIN I: 10.87 ng/mL — AB (ref <0.03)
Troponin I: 15.22 ng/mL (ref <0.03)
Troponin I: 10.33 ng/mL (ref <0.03)

## 2016-04-07 LAB — CBC
HCT: 43.6 % (ref 39.0–52.0)
Hemoglobin: 14.6 g/dL (ref 13.0–17.0)
MCH: 30.7 pg (ref 26.0–34.0)
MCHC: 33.5 g/dL (ref 30.0–36.0)
MCV: 91.8 fL (ref 78.0–100.0)
PLATELETS: 202 10*3/uL (ref 150–400)
RBC: 4.75 MIL/uL (ref 4.22–5.81)
RDW: 12.7 % (ref 11.5–15.5)
WBC: 11.2 10*3/uL — AB (ref 4.0–10.5)

## 2016-04-07 LAB — PLATELET COUNT
PLATELETS: 191 10*3/uL (ref 150–400)
PLATELETS: 198 10*3/uL (ref 150–400)
Platelets: 186 10*3/uL (ref 150–400)

## 2016-04-07 MED ORDER — CARVEDILOL 6.25 MG PO TABS: 6.2500 mg | ORAL_TABLET | Freq: Two times a day (BID) | ORAL | 5 refills | Status: DC

## 2016-04-07 MED ORDER — NITROGLYCERIN 0.4 MG SL SUBL: 0.4000 mg | SUBLINGUAL_TABLET | SUBLINGUAL | 2 refills | Status: DC | PRN

## 2016-04-07 MED ORDER — TICAGRELOR 90 MG PO TABS: 90.0000 mg | ORAL_TABLET | Freq: Two times a day (BID) | ORAL | 10 refills | Status: DC

## 2016-04-07 MED ORDER — ATORVASTATIN CALCIUM 80 MG PO TABS: 80.0000 mg | ORAL_TABLET | Freq: Every day | ORAL | 5 refills | Status: DC

## 2016-04-07 MED ORDER — ASPIRIN 81 MG PO TBEC: 81.0000 mg | DELAYED_RELEASE_TABLET | Freq: Every day | ORAL | Status: DC

## 2016-04-07 MED ORDER — NICOTINE 21 MG/24HR TD PT24: 21.0000 mg | MEDICATED_PATCH | Freq: Every day | TRANSDERMAL | 0 refills | Status: DC

## 2016-04-07 MED ORDER — TICAGRELOR 90 MG PO TABS
90.0000 mg | ORAL_TABLET | Freq: Two times a day (BID) | ORAL | 0 refills | Status: DC
Start: 2016-04-07 — End: 2016-05-11

## 2016-04-07 MED FILL — Medication: Qty: 1 | Status: AC

## 2016-04-07 NOTE — Progress Notes (Signed)
Pt discharged in stable condition with his sister and son to private vehicle. I reviewed discharge instructions with patient. No further questions. Patient not very receptive to education related to his heart.

## 2016-04-07 NOTE — Discharge Instructions (Signed)
Finish your antibiotics that you were recently given for your cough  Acute Coronary Syndrome Acute coronary syndrome (ACS) is a serious problem in which there is suddenly not enough blood and oxygen supplied to the heart. ACS may mean that one or more of the blood vessels in your heart (coronary arteries) may be blocked. ACS can result in chest pain or a heart attack (myocardial infarction or MI). What are the causes? This condition is caused by atherosclerosis, which is the buildup of fat and cholesterol (plaque) on the inside of the arteries. Over time, the plaque may narrow or block the artery, and this will lessen blood flow to the heart. Plaque can also become weak and break off within a coronary artery to form a clot and cause a sudden blockage. What increases the risk? The risk factors of this condition include:  High cholesterol levels.  High blood pressure (hypertension).  Smoking.  Diabetes.  Age.  Family history of chest pain, heart disease, or stroke.  Lack of exercise. What are the signs or symptoms? The most common signs of this condition include:  Chest pain, which can be:  A crushing or squeezing in the chest.  A tightness, pressure, fullness, or heaviness in the chest.  Present for more than a few minutes, or it can stop and recur.  Pain in the arms, neck, jaw, or back.  Unexplained heartburn or indigestion.  Shortness of breath.  Nausea.  Sudden cold sweats.  Feeling light-headed or dizzy. Sometimes, this condition has no symptoms. How is this diagnosed? ACS may be diagnosed through the following tests:  Electrocardiogram (ECG).  Blood tests.  Coronary angiogram. This is a procedure to look at the coronary arteries to see if there is any blockage. How is this treated? Treatment for ACS may include:  Healthy behavioral changes to reduce or control risk factors.  Medicine.  Coronary stenting.A stent helps to keep an artery open.  Coronary  angioplasty. This procedure widens a narrowed or blocked artery.  Coronary artery bypass surgery. This will allow your blood to pass the blockage (bypass) to reach your heart. Follow these instructions at home: Eating and drinking  Follow a heart-healthy diet. A dietitian can you help to educate you about healthy food options and changes.  Use healthy cooking methods such as roasting, grilling, broiling, baking, poaching, steaming, or stir-frying. Talk to a dietitian to learn more about healthy cooking methods. Medicines  Take medicines only as directed by your health care provider.  Do not take the following medicines unless your health care provider approves:  Nonsteroidal anti-inflammatory drugs (NSAIDs), such as ibuprofen, naproxen, or celecoxib.  Vitamin supplements that contain vitamin A, vitamin E, or both.  Hormone replacement therapy that contains estrogen with or without progestin.  Stop illegal drug use. Activity  Follow an exercise program that is approved by your health care provider.  Plan rest periods when you are fatigued. Lifestyle  Do not use any tobacco products, including cigarettes, chewing tobacco, or electronic cigarettes. If you need help quitting, ask your health care provider.  If you drink alcohol, and your health care provider approves, limit your alcohol intake to no more than 1 drink per day. One drink equals 12 ounces of beer, 5 ounces of wine, or 1 ounces of hard liquor.  Learn to manage stress.  Maintain a healthy weight. Lose weight as approved by your health care provider. General instructions  Manage other health conditions, such as hypertension and diabetes, as directed by your  health care provider.  Keep all follow-up visits as directed by your health care provider. This is important.  Your health care provider may ask you to monitor your blood pressure. A blood pressure reading consists of a higher number over a lower number, such as  110 over 72, written as 110/72. Ideally, your blood pressure should be:  Below 140/90 if you have no other medical conditions.  Below 130/80 if you have diabetes or kidney disease. Get help right away if:  You have pain in your chest, neck, arm, jaw, stomach, or back that lasts more than a few minutes, is recurring, or is not relieved by taking medicine under your tongue (sublingual nitroglycerin).  You have profuse sweating without cause.  You have unexplained:  Heartburn or indigestion.  Shortness of breath or difficulty breathing.  Nausea or vomiting.  Fatigue.  Feelings of nervousness or anxiety.  Weakness.  Diarrhea.  You have sudden light-headedness or dizziness.  You faint. These symptoms may represent a serious problem that is an emergency. Do not wait to see if the symptoms will go away. Get medical help right away. Call your local emergency services (911 in the [place] Do not drive yourself to the clinic or hospital.  This information is not intended to replace advice given to you by your health care provider. Make sure you discuss any questions you have with your health care provider. Document Released: 03/13/2005 Document Revised: 08/25/2015 Document Reviewed: 07/15/2013 Elsevier Interactive Patient Education  2017 [employer].

## 2016-04-07 NOTE — Discharge Summary (Signed)
Discharge Summary    Patient ID: Michael Schmitt,  MRN: [id number], DOB/AGE: October 15, 1970 46 y.o.  Admit date: 04/05/2016 Discharge date: 04/07/2016  Primary Care Provider: [name] Primary Cardiologist: Dr. Tamala Julian   Discharge Diagnoses    Principal Problem:   Acute ST elevation myocardial infarction (STEMI) involving left anterior descending (LAD) coronary artery [facility]) Active Problems:   Clinical depression   Acid reflux   Generalized anxiety disorder   Coronary stent thrombosis   Ventricular fibrillation (HCC)   Allergies Allergies  Allergen Reactions  . Prednisone Other (See Comments)    Hypertension, makes him feel spacey  . Hydrocodone Other (See Comments)    Headache, irritable  . Varenicline Other (See Comments)    Suicidal thoughts    Diagnostic Studies/Procedures    Procedures   Coronary Stent Intervention  Intravascular Ultrasound/IVUS  Left Heart Cath and Coronary Angiography  Conclusion    Acute anterior ST elevation myocardial infarction from acute LAD stent thrombosis.  Widely patent second obtuse marginal and right coronary stents.  75-80% stenosis in the ostium of the second obtuse marginal.  95-99% proximal LAD in-stent restenosis and stent thrombosis with heavy thrombus burden noted by angiography. Apical occlusion of the LAD due to distal embolization from the proximal vessel.  Successful PTCA followed by overlap stenting of the proximal LAD from 99% to 0% with TIMI grade 3 flow. The apical LAD could not be recanalized with "Dotter".  LVEF 45-50% with apical akinesis noted. EDP 19 mmHg    2D Echo 04/05/16 Study Conclusions  - Left ventricle: The cavity size was normal. Systolic function was   mildly reduced. The estimated ejection fraction was in the range   of 45% to 50%. There is akinesis of the apical myocardium.   Doppler parameters are consistent with abnormal left ventricular   relaxation (grade 1 diastolic  dysfunction). - Tricuspid valve: There was trivial regurgitation.   History of Present Illness    46 y/o male with h/o DM, CAD (s/p multiple stents in 2016), anxiety and depression who presented as a CODE STEMI on 04/05/2016.  In the ED he went into VF twice. Required 2 DCCV with immediate ROSC. 300 Amio given. Also Heparin and ASA 325 given.   ON arrival to cath lab, patient was  still in pain with persistent ST elevations.   Hospital Course   Emergent LHC was performed by Dr. Tamala Julian. He was found to have acute LAD stent thrombosis, widely patent second obtuse marginal and right coronary stents, 75-80% stenosis in the ostium of the second obtuse marginal. He underwent successful PTCA followed by overlap stenting of the proximal LAD from 99% to 0% with TIMI grade 3 flow. The apical LAD could not be recanalized with "Dotter". LVEF 45-50% with apical akinesis noted. EDP 19 mmHg.  Troponin peaked at 15.22. He tolerated the procedure well and left the cath lab in stable condition. He was placed on DAPT with ASA + Brilinta. He was also treated with a high dose statin, a beta blocker and ACE-I. Lipid panel showed severely elevated TG at 860 mg/dL. LDL could not be calculated. Lipitor 80 mg was prescribed. His DM is controlled. Hgb A1c is at goal of < 7 at 5.9. He had no post cath complications and no recurrent CP. 2D echo was preformed showing an EF of 45-50%. There is akinesis of the apical myocardium. Renal function remained stable. He was noted to have an elevated white count, as high as 17  at time of admit and complained of cough and ear pain. He reported he had been given an Rx for an antibiotic prior to being admitted, but had not yet finished his antibiotic course. He remained afebrile and repeat CBC showed improvement in WBC, down to 11. He was advised to resume his home antibiotic once discharged, 8 days remaining. On 04/07/16, he was seen and examined by Dr. Percival Spanish, who determined he was stable for  discharge home. He will f/u in 1-2 weeks with Dr. Tamala Julian or APP.    Consultants: none    Discharge Vitals Blood pressure 129/85, pulse 97, temperature 98.5 F (36.9 C), temperature source Oral, resp. rate (!) 21, height 6\' 2"  (1.88 m), weight 187 lb 6.3 oz (85 kg), SpO2 99 %.  Filed Weights   04/05/16 0545 04/06/16 0400 04/07/16 0335  Weight: 191 lb 2.2 oz (86.7 kg) 190 lb 1.6 oz (86.2 kg) 187 lb 6.3 oz (85 kg)    Labs & Radiologic Studies    CBC  Recent Labs  04/05/16 0246 04/05/16 0555  04/07/16 0623 04/07/16 1139  WBC 12.6* 17.1*  --   --  11.2*  NEUTROABS 8.1*  --   --   --   --   HGB 16.9 16.9  --   --  14.6  HCT 49.7 49.9  --   --  43.6  MCV 90.5 91.9  --   --  91.8  PLT 418 302  < > 191 198  202  < > = values in this interval not displayed. Basic Metabolic Panel  Recent Labs  04/05/16 1803 04/06/16 0727  NA 135 133*  K 3.7 3.8  CL 102 99*  CO2 22 22  GLUCOSE 149* 177*  BUN 10 10  CREATININE 0.97 0.96  CALCIUM 9.3 8.6*   Liver Function Tests  Recent Labs  04/05/16 0246  AST 56*  ALT 47  ALKPHOS 100  BILITOT 0.5  PROT 7.5  ALBUMIN 4.1    Recent Labs  04/05/16 0246  LIPASE 27   Cardiac Enzymes  Recent Labs  04/06/16 2351 04/07/16 0623 04/07/16 1139  TROPONINI 15.22* 10.87* 10.33*   BNP Invalid input(s): POCBNP D-Dimer No results for input(s): DDIMER in the last 72 hours. Hemoglobin A1C  Recent Labs  04/05/16 0555  HGBA1C 5.9*   Fasting Lipid Panel  Recent Labs  04/05/16 0246  CHOL 246*  HDL 42  LDLCALC UNABLE TO REPORT DUE TO LIPEMIC INTERFERENCE  TRIG 860*  CHOLHDL 5.9   Thyroid Function Tests No results for input(s): TSH, T4TOTAL, T3FREE, THYROIDAB in the last 72 hours.  Invalid input(s): FREET3 _____________  Dg Chest 2 View  Result Date: 03/26/2016 CLINICAL DATA:  Cough and chest congestion and shortness of breath. EXAM: CHEST  2 VIEW COMPARISON:  11/24/2015 FINDINGS: Heart size and pulmonary vascularity  are normal. Chronic or recurrent peribronchial thickening. No acute infiltrates or effusions. Bones are normal. IMPRESSION: Chronic or recurrent bronchitic changes. Electronically Signed   By: Lorriane Shire M.D.   On: 03/26/2016 10:29   Disposition   Pt is being discharged home today in good condition.  Follow-up Plans & Appointments    Follow-up Dib[place] III, MD Follow up.   Specialty:  Cardiology Why:  our office will call you with a hospital follow visit in our office in 1-2 weeks  Contact information: [address] [place] [address] [phone]  Discharge Instructions    Amb Referral to Cardiac Rehabilitation    Complete by:  As directed    Diagnosis:   Coronary Stents STEMI PTCA     Diet - low sodium heart healthy    Complete by:  As directed    Increase activity slowly    Complete by:  As directed       Discharge Medications   Discharge Medication List as of 04/07/2016  1:31 PM    START taking these medications   Details  aspirin EC 81 MG EC tablet Take 1 tablet (81 mg total) by mouth daily., Starting Sat 04/08/2016, No Print    carvedilol (COREG) 6.25 MG tablet Take 1 tablet (6.25 mg total) by mouth 2 (two) times daily with a meal., Starting Fri 04/07/2016, Normal    nicotine (NICODERM CQ - DOSED IN MG/24 HOURS) 21 mg/24hr patch Place 1 patch (21 mg total) onto the skin daily., Starting Sat 04/08/2016, OTC    !! ticagrelor (BRILINTA) 90 MG TABS tablet Take 1 tablet (90 mg total) by mouth 2 (two) times daily., Starting Fri 04/07/2016, Normal    !! ticagrelor (BRILINTA) 90 MG TABS tablet Take 1 tablet (90 mg total) by mouth 2 (two) times daily., Starting Fri 04/07/2016, Normal     !! - Potential duplicate medications found. Please discuss with provider.    CONTINUE these medications which have CHANGED   Details  atorvastatin (LIPITOR) 80 MG tablet Take 1 tablet (80 mg total) by mouth daily at 6 PM., Starting  Fri 04/07/2016, Normal    nitroGLYCERIN (NITROSTAT) 0.4 MG SL tablet Place 1 tablet (0.4 mg total) under the tongue every 5 (five) minutes x 3 doses as needed for chest pain., Starting Fri 04/07/2016, Normal      CONTINUE these medications which have NOT CHANGED   Details  clonazePAM (KLONOPIN) 1 MG tablet Take 1 tablet (1 mg total) by mouth 3 (three) times daily as needed for anxiety., Starting Sun 03/26/2016, Until Wed 04/05/2016, Print    dexlansoprazole (DEXILANT) 60 MG capsule Take 60 mg by mouth daily., Historical Med    FLUoxetine (PROZAC) 20 MG capsule Take 1 capsule (20 mg total) by mouth at bedtime., Starting Sun 03/26/2016, Until Wed 04/05/2016, Print    lisinopril (PRINIVIL,ZESTRIL) 5 MG tablet Take 1 tablet (5 mg total) by mouth daily., Starting Mon 06/21/2015, Normal    mirtazapine (REMERON) 30 MG tablet Take 30 mg by mouth at bedtime., Starting Tue 03/07/2016, Historical Med    OLANZapine (ZYPREXA) 5 MG tablet Take 1 tablet (5 mg total) by mouth at bedtime., Starting Sun 03/26/2016, Until Sun 04/02/2016, Print      STOP taking these medications     glimepiride (AMARYL) 1 MG tablet      propranolol (INDERAL) 10 MG tablet          Aspirin prescribed at discharge?  Yes High Intensity Statin Prescribed? (Lipitor 40-80mg  or Crestor 20-40mg ): Yes Beta Blocker Prescribed? Yes For EF <40%, was ACEI/ARB Prescribed? Yes ADP Receptor Inhibitor Prescribed? (i.e. Plavix etc.-Includes Medically Managed Patients): Yes For EF <40%, Aldosterone Inhibitor Prescribed? No: EF >40% Was EF assessed during THIS hospitalization? Yes Was Cardiac Rehab II ordered? (Included Medically managed Patients): Yes   Outstanding Labs/Studies   F/u BMP at post hospital f/u. F/u FLP and HFTs in 6 weeks  Duration of Discharge Encounter   Greater than 30 minutes including physician time.  Signed, Lyda Jester PA-C 04/07/2016, 2:14 PM  Patient seen and examined.  Plan  as discussed in my  rounding note for today and outlined above. Jeneen Rinks [facility]  04/07/2016  2:24 PM

## 2016-04-07 NOTE — Progress Notes (Signed)
CARDIAC REHAB PHASE I   PRE:  Rate/Rhythm: 98 SR  BP:  Sitting: 122/75        SaO2: 97 RA  MODE:  Ambulation: 550 ft   POST:  Rate/Rhythm: 107 ST  BP:  Sitting: 128/92         SaO2: 99 RA  Pt c/o ongoing chest discomfort, states it is worse with inspiration. Pt ambulated 550 ft on RA, independent, steady gait, tolerated well with no worsening complaints, declined to ambulate farther. Pt is concerned about his HR. Attempted to review/reinforce education with pt with very limited receptiveness. Pt unlikely to change his habits, states he is more concerned about losing his home and having to go to jail after multiple DWIs. Phase 2 referral sent to [facility]. Pt to bed per pt request after walk, call bell within reach.   [place], RN, BSN 04/07/2016 11:45 AM

## 2016-04-07 NOTE — Progress Notes (Signed)
Patient Name: Michael Schmitt Date of Encounter: 04/07/2016  Primary Cardiologist: Dr. Laurena Bering Problem List     Principal Problem:   Acute ST elevation myocardial infarction (STEMI) involving left anterior descending (LAD) coronary artery [facility]) Active Problems:   Clinical depression   Acid reflux   Generalized anxiety disorder   Coronary stent thrombosis   Ventricular fibrillation (HCC)     Subjective   Complaining of cough with some ear pain.  No SOB.  Chest soreness.  No acute changes on EKG yesterday.     Inpatient Medications    Scheduled Meds: . aspirin EC  81 mg Oral Daily  . atorvastatin  80 mg Oral q1800  . carvedilol  6.25 mg Oral BID WC  . FLUoxetine  20 mg Oral QHS  . glimepiride  1 mg Oral Q breakfast  . guaiFENesin  600 mg Oral BID  . heparin  5,000 Units Subcutaneous Q8H  . lidocaine  1 patch Transdermal Q24H  . lisinopril  5 mg Oral Daily  . loratadine  10 mg Oral Daily  . mirtazapine  30 mg Oral QHS  . nicotine  21 mg Transdermal Daily  . pantoprazole  40 mg Oral Daily  . sodium chloride flush  3 mL Intravenous Q12H  . ticagrelor  90 mg Oral BID   Continuous Infusions:  PRN Meds: sodium chloride, acetaminophen, ALPRAZolam, clonazePAM, morphine injection, nitroGLYCERIN, ondansetron (ZOFRAN) IV, sodium chloride, sodium chloride flush   Vital Signs    Vitals:   04/07/16 0052 04/07/16 0335 04/07/16 0611 04/07/16 0747  BP: 110/80 105/75 119/78 129/85  Pulse: (!) 101 90 98 97  Resp: 18 16 (!) 23 (!) 21  Temp: 98.2 F (36.8 C) 98.5 F (36.9 C)    TempSrc: Oral Oral    SpO2: 97% 97% 95% 99%  Weight:  187 lb 6.3 oz (85 kg)    Height:        Intake/Output Summary (Last 24 hours) at 04/07/16 0821 Last data filed at 04/07/16 0700  Gross per 24 hour  Intake              480 ml  Output             1250 ml  Net             -770 ml   Filed Weights   04/05/16 0545 04/06/16 0400 04/07/16 0335  Weight: 191 lb 2.2 oz (86.7 kg) 190 lb 1.6  oz (86.2 kg) 187 lb 6.3 oz (85 kg)    Physical Exam    GEN: NAD.  Neck:  No  JVD Cardiac: Regular Rate (tachy) and Rhythm, no murmurs, no rubs, or gallops.  No edema.  Radials/DP/PT 2+  Respiratory:  Respirations  regular and unlabored, clear to auscultation bilaterally. GI: Soft, nontender, nondistended, BS + x 4. Skin: warm and dry, no rash. Neuro:   Strength and sensation are intact. Psych:  AAOx3.  Normal affect.  Labs    CBC  Recent Labs  04/05/16 0246 04/05/16 0555  04/06/16 2351 04/07/16 0623  WBC 12.6* 17.1*  --   --   --   NEUTROABS 8.1*  --   --   --   --   HGB 16.9 16.9  --   --   --   HCT 49.7 49.9  --   --   --   MCV 90.5 91.9  --   --   --   PLT 418 302  < >  186 191  < > = values in this interval not displayed. Basic Metabolic Panel  Recent Labs  04/05/16 1803 04/06/16 0727  NA 135 133*  K 3.7 3.8  CL 102 99*  CO2 22 22  GLUCOSE 149* 177*  BUN 10 10  CREATININE 0.97 0.96  CALCIUM 9.3 8.6*   Liver Function Tests  Recent Labs  04/05/16 0246  AST 56*  ALT 47  ALKPHOS 100  BILITOT 0.5  PROT 7.5  ALBUMIN 4.1    Recent Labs  04/05/16 0246  LIPASE 27   Cardiac Enzymes  Recent Labs  04/06/16 1914 04/06/16 2351 04/07/16 0623  TROPONINI 16.80* 15.22* 10.87*   BNP Invalid input(s): POCBNP D-Dimer No results for input(s): DDIMER in the last 72 hours. Hemoglobin A1C  Recent Labs  04/05/16 0555  HGBA1C 5.9*   Fasting Lipid Panel  Recent Labs  04/05/16 0246  CHOL 246*  HDL 42  LDLCALC UNABLE TO REPORT DUE TO LIPEMIC INTERFERENCE  TRIG 860*  CHOLHDL 5.9   Thyroid Function Tests No results for input(s): TSH, T4TOTAL, T3FREE, THYROIDAB in the last 72 hours.  Invalid input(s): FREET3  Telemetry    NSR, PVCs - Personally Reviewed  ECG    NSR, rate 96, evolving anterior MI, acute injury pattern is resolving, QT prolonged (Done 1/11). . - Personally Reviewed  Radiology    No results found.  Cardiac Studies    ECHO:  - Left ventricle: The cavity size was normal. Systolic function was   mildly reduced. The estimated ejection fraction was in the range   of 45% to 50%. There is akinesis of the apical myocardium.   Doppler parameters are consistent with abnormal left ventricular   relaxation (grade 1 diastolic dysfunction). - Tricuspid valve: There was trivial regurgitation.   Patient Profile     46 y o man with a PMH of diabetes, CAD (s/p multiple stents in 2016), anxiety and depression now presents with Stemi from [place]. In the ED he went into VF twice. Required 2 DCCV with immediate ROSC. 300 Amio given. Also Heparin and ASA 325. S/p cath w/ DES prox LAD  Assessment & Plan    ACUTE ANTERIOR MI:  PCI with DES to previously stented LAD.    EF is slightly low compared with previous.  Medical management.  I increased the beta blocker yesterday.   VENTRICULAR FIB:   No further runs of NSVT.  Beta blocker increased yesterday.  I think his continued tachycardia is due to his chronic pain, lack of ETOH use in hospital and lower benzos here than he gets at home.  Continue current meds.   TOBACCO ABUSE:  Educated.  He is unlikely to quit.   ETOH ABUSE:   Educated.  He is unlikely to quit.   CHRONIC PAIN:   He was advised not to use Goodies Powder and to follow up as an out patient with cardiac rehab.   COUGH/EAR PAIN:  Nothing on exam.  However, he said that he did not complete a course of antibiotics that he was to take at home and he has 8 days worth of amoxacillin to complete for a URI.  He has not had fevers.  WBC was up yesterday secondary to the MI.  I will repeat CBC and BMET.  He can go home to complete his antibiotics and follow with his PCP.    Signed, Minus Breeding, MD  04/07/2016, 8:21 AM

## 2016-04-11 ENCOUNTER — Other Ambulatory Visit: Payer: Self-pay | Admitting: *Deleted

## 2016-04-11 ENCOUNTER — Ambulatory Visit: Payer: Self-pay | Admitting: *Deleted

## 2016-04-11 DIAGNOSIS — I2102 ST elevation (STEMI) myocardial infarction involving left anterior descending coronary artery: Secondary | ICD-10-CM

## 2016-04-11 DIAGNOSIS — E0800 Diabetes mellitus due to underlying condition with hyperosmolarity without nonketotic hyperglycemic-hyperosmolar coma (NKHHC): Secondary | ICD-10-CM

## 2016-04-11 NOTE — Patient Outreach (Signed)
[facility]) Care Management  04/11/2016  Michael Schmitt March 14, 1971 [id number]  Patient was referred from [facility] ED utilization  Census. Patient was admitted to hospital on 04/05/2016 and Discharged on 04/07/2016. Patient had Stent placement Plan:  Refer to [facility] Management for transition of care.  [place] [facility] Management [phone]

## 2016-04-12 ENCOUNTER — Other Ambulatory Visit: Payer: Self-pay | Admitting: *Deleted

## 2016-04-12 ENCOUNTER — Encounter: Payer: Self-pay | Admitting: *Deleted

## 2016-04-12 NOTE — Patient Outreach (Signed)
Transition of care call successful, referral received  from [place] coach 1/16 to follow for transition of care.  Recent hospitalization 1/10-1/12 for MI (stemi), stent placement.  Hx of clinical depression, acid reflux, V fib, DM.    Spoke with pt, HIPAA/identity verified, discussed purpose of call, follow up on recent hospital discharge.  Also discussed [facility] services/transition of care program - follow 31 days (weekly phone calls, a home visit), no cost to him to which pt was not open at first but then gave verbal consent.   Pt reports no chest pain, little sob, knows when to call MD/signs to look for.   RN CM discussed with pt MD follow up appointments post discharge to which pt states told them he is not going to [facility] (Heart MD), has his own Heart MD Dr. Saralyn Pilar. Pt reports he has not made follow up appointments yet with Primary Care MD and Heart MD, has issues with transportation, lost his license, can ask sister to drive to MD appointments. RN CM discussed with pt if sister unable to provide transportation to call RN CM to which pt voiced  Understanding.   Pt reports his is not checking his blood sugars, last time MD checked it,  it was where it was suppose to be, MD took him off diabetic medication.   Pt reports he has all of his medications except Aspirin (was called in but pharmacy did not give to him) and Nicoderm patch (does not want to stop smoking).  Pt requested RN CM speak with his sister Graylon Good to review his medications as she helps him with them.   Patient was recently discharged from hospital and all medications have been reviewed- with pt's sister.   Sister reports pt only has one tablet  left of his Clonazepam and as was relayed by pt to sister was only given a 7 day supply post discharge/told to follow up with MD for refill.  Sister states pt did not want to follow up with MD to which RN CM discussed   Importance of pt follow up with MD (heart) post discharge, Primary Care MD post  discharge/medication refill - sister voiced understanding.     Plan:  As discussed with pt, plan to follow  up again next week- home visit.   Pt provided a different address for home visit next week-               [address], [place] [address].            Barrier letter sent to Dr. Clayborn Bigness - inform of [facility] involvement (Transition of care).     Zara Chess.   [facility] Management  [phone]

## 2016-04-20 ENCOUNTER — Encounter: Payer: Self-pay | Admitting: *Deleted

## 2016-04-20 ENCOUNTER — Other Ambulatory Visit: Payer: Self-pay | Admitting: *Deleted

## 2016-04-20 NOTE — Patient Outreach (Signed)
[facility]) Care Management   04/20/2016  Michael Schmitt 05/24/1970 [id number]  Michael Schmitt is an 46 y.o. male  Subjective: Pt reports currently dealing with where he and his children will live after 2/3, where he  Is staying has to be out by then.   Pt reports he is a single parent raising 2 children plus his sister and  Mom are staying with him.  Pt reports  He has issues with his Mom but cannot live without both sister And Mom - on disability because of chronic pain, mental issues.  Pt reports depression causes him to Drink bad, not drinking now, no license because of DWI, has to go to court for second DWI, concerned  If will go to jail.   Pt reports his sister manages his medications, did  not get Aspirin yet.   Pt reports did  not schedule appointment with Primary Care MD or heart MD yet.   Objective:  Vitals:   04/20/16 1540  BP: 114/80  Pulse: 90  Resp: 20     ROS  Physical Exam  Constitutional: He is oriented to person, place, and time. He appears well-developed and well-nourished.  Cardiovascular: Normal rate, regular rhythm and normal heart sounds.   Respiratory: Effort normal and breath sounds normal.  GI: Soft. Bowel sounds are normal.  Musculoskeletal: Normal range of motion. He exhibits no edema.  Neurological: He is alert and oriented to person, place, and time.  Skin: Skin is warm and dry.  Psychiatric: His behavior is normal. Judgment and thought content normal.  Per pt depressed, tear eye at times.     Encounter Medications:   Outpatient Encounter Prescriptions as of 04/20/2016  Medication Sig Note  . atorvastatin (LIPITOR) 80 MG tablet Take 1 tablet (80 mg total) by mouth daily at 6 PM.   . carvedilol (COREG) 6.25 MG tablet Take 1 tablet (6.25 mg total) by mouth 2 (two) times daily with a meal.   . dexlansoprazole (DEXILANT) 60 MG capsule Take 60 mg by mouth daily.   Marland Kitchen lisinopril (PRINIVIL,ZESTRIL) 5 MG tablet Take 1 tablet (5 mg  total) by mouth daily.   . mirtazapine (REMERON) 30 MG tablet Take 30 mg by mouth at bedtime. 04/05/2016: Received from: External Pharmacy Received Sig: TAKE 1 TABLET BY MOUTH AT BEDTIME  . ticagrelor (BRILINTA) 90 MG TABS tablet Take 1 tablet (90 mg total) by mouth 2 (two) times daily.   Marland Kitchen aspirin EC 81 MG EC tablet Take 1 tablet (81 mg total) by mouth daily. (Patient not taking: Reported on 04/12/2016)   . clonazePAM (KLONOPIN) 1 MG tablet Take 1 tablet (1 mg total) by mouth 3 (three) times daily as needed for anxiety. (Patient taking differently: Take 2 mg by mouth 3 (three) times daily as needed for anxiety. ) 04/12/2016: Per pt, only has one pill left, told to follow up with MD (no refills)   . FLUoxetine (PROZAC) 20 MG capsule Take 1 capsule (20 mg total) by mouth at bedtime. 04/20/2016: Pt reports has one tablet left.  Need to f/u with PCP for refill.   . nicotine (NICODERM CQ - DOSED IN MG/24 HOURS) 21 mg/24hr patch Place 1 patch (21 mg total) onto the skin daily. (Patient not taking: Reported on 04/12/2016) 04/12/2016: Does not have, per pt does not want to stop smoking   . nitroGLYCERIN (NITROSTAT) 0.4 MG SL tablet Place 1 tablet (0.4 mg total) under the tongue every 5 (five) minutes x 3  doses as needed for chest pain. (Patient not taking: Reported on 04/12/2016) 04/20/2016: Available if needed.   Marland Kitchen OLANZapine (ZYPREXA) 5 MG tablet Take 1 tablet (5 mg total) by mouth at bedtime. 04/12/2016: Pt pt not been taking.    . ticagrelor (BRILINTA) 90 MG TABS tablet Take 1 tablet (90 mg total) by mouth 2 (two) times daily. 04/20/2016: Medication repeated.    No facility-administered encounter medications on file as of 04/20/2016.     Functional Status:   In your present state of health, do you have any difficulty performing the following activities: 04/20/2016 04/05/2016  Hearing? N N  Vision? Y N  Difficulty concentrating or making decisions? Y N  Walking or climbing stairs? N N  Dressing or bathing? N N   Doing errands, shopping? N N  Preparing Food and eating ? N -  Using the Toilet? N -  In the past six months, have you accidently leaked urine? N -  Do you have problems with loss of bowel control? N -  Managing your Medications? Y -  Managing your Finances? N -  Housekeeping or managing your Housekeeping? N -  Some recent data might be hidden    Fall/Depression Screening:    PHQ 2/9 Scores 04/20/2016  PHQ - 2 Score 6  PHQ- 9 Score 24    Assessment:  46 year old gentleman, resides with 2 children,sister, Mom.   Tear  Eye at times  talking about issues with Mom, depression.  Depression/social issues:  Discussed with pt referral to [facility] LCSW to which pt agreed.  Chronic pain:  As discussed with pt, need to follow up with Primary Care MD Klonopin.   CAD:  Recent hospitalization for MI (stemi)- reinforced with pt to purchase Aspirin, take as  Directed.   Pt needs follow up appointments with Primary Care MD and Heart MD.   Plan:  Per pt's permission, called Dr. Laurelyn Sickle office- follow up appointment made for 2/14.            Per pt's permission, called Dr. Alveria Apley office- follow up appointment made for 1/30, both               Appointments relayed to sister.             Medication issues:  Relayed to sister pt needs Aspirin to which voiced understanding.             Referral for [facility] CSW to be sent- address social issues.             Plan to continue to follow pt for transition of care, follow up again next week telephonically.             Plan to fax  Dr. Humphrey Rolls 1/25 home visit encounter.                [facility] CM Care Plan Problem One   Flowsheet Row Most Recent Value  Care Plan Problem One  Risk for readmission related to recent hospitalization of MI (stemi)  Role Documenting the Problem One  Care Management Toast for Problem One  Active  THN Long Term Goal (31-90 days)  Pt will not readmit to the hospital within the next 31 days   THN Long Term Goal Start Date   04/12/16  Interventions for Problem One Long Term Goal  Home visit done, medications reviewed,    THN CM Short Term Goal #1 (0-30 days)  Pt would take all  medications as ordered for the next 30 days   THN CM Short Term Goal #1 Start Date  04/12/16  Interventions for Short Term Goal #1  Reinforced with pt/sister to purchase aspirin, start taking.   THN CM Short Term Goal #2 (0-30 days)  Pt would follow up with Primary Care MD and Heart MD within the next 14 days   THN CM Short Term Goal #2 Start Date  04/12/16  Interventions for Short Term Goal #2  Appointments made for pt to see Heart MD 1/30 and PCP 2/14.      Zara Chess.   [place] Care Management  [phone]

## 2016-04-25 ENCOUNTER — Other Ambulatory Visit: Payer: Self-pay | Admitting: *Deleted

## 2016-04-25 NOTE — Patient Outreach (Signed)
[facility]) Care Management  04/25/2016  Michael Schmitt  1970-05-13 [id number]    Phone call to patient at the request of RNCM to provide community resources. Patient reports having 2 DUI's and is facing mandatory  jail time of 7 days to 1 year. Patient increasingly nervous about this stating that he cannot do jail time. Patient concerned about not being allowed his Klonopjn while incarcerated and about the welfare of his children, who will probably reside with family members. Patient reports feeling depressed, however not suicidal. Patient reports last using alcohol about 2 to 3 days ago.  "I have been so stressed lately, I do not know what else to do".   This Education officer, museum provided supportive counseling.  Discussed the possibility of talking to his lawyer about alternative solutions as in substance abuse treatment. Patient states that he was considering [facility](halfway house, substance abuse program), however he is not really interested in substance abuse treatment. "I can quit when I want".  Patient also reports not being interested in mental health counseling, stating that he did not find it beneficial.  Patient states that he has an appointment to talk with his lawyer on 04/26/16 to further discuss his options but he is pretty sure he is facing some jail time.  Patient also states that his housing is temporary and he will need to move by 04/28/16.  This Education officer, museum gave patient the contact information for [employer] for emergency housing and food resources    Plan: This social will follow up with patient on 04/26/16 following his meeting with his lawyer.    Sheralyn Boatman [facility] Care Management [phone]

## 2016-04-26 ENCOUNTER — Other Ambulatory Visit: Payer: Self-pay | Admitting: *Deleted

## 2016-04-26 NOTE — Patient Outreach (Addendum)
[facility]) Care Management  04/26/2016  Michael Schmitt 06-Mar-1971 [id number]   Phone call to patient to follow up on his meeting with his lawyer. Per patient, he did not have transportation to see his lawyer and was not abel to speak with him.  This Education officer, museum provided patient with the contact number to [facility] [phone] to assist with addressing patient's alcohol abuse and mental health needs.   Plan; This Education officer, museum will follow up with patient within 2 weeks.     Sheralyn Boatman [facility] Care Management [phone]

## 2016-04-27 ENCOUNTER — Other Ambulatory Visit: Payer: Self-pay | Admitting: *Deleted

## 2016-04-27 ENCOUNTER — Ambulatory Visit: Payer: Medicare Other | Admitting: *Deleted

## 2016-04-27 NOTE — Patient Outreach (Signed)
Attempt made to contact pt as part of ongoing transition of care, follow up on recent hospitalization 1/10-1/12 for MI (STEMI) stent placement.    HIPAA compliant voice message left with contact name and number.     Plan: if no response to voice message left, plan to follow up again tomorrow telephonically.     Zara Chess.   [place] Care Management  [phone]

## 2016-04-28 ENCOUNTER — Other Ambulatory Visit: Payer: Self-pay | Admitting: *Deleted

## 2016-04-28 NOTE — Patient Outreach (Signed)
Transition of care call successful, ongoing follow up on recent hospitalization 1/10-1/12 for MI (STEMI), stent placement.   Spoke with pt, HIPAA/identity verified.   Pt reports was suppose to see Dr. Nehemiah Schmitt 1/30, sister was sick (provides transportation), rescheduled for 2/5.  Pt reports did get his Aspirin, taking as well as his other medications, sister still does his medications.   Pt reports no chest pain.  Pt reports on housing situation, fixing to move today- friend got him a house (address [address] [place], [place] [address]), once settled in to change address.   Pt reports he was not able to meet with his lawyer, sister (provides transportation)got sick, left lawyer a message,have not heard back.  Pt reports continues to smoke, not willing to quit.     Plan:  As discussed with pt, plan to follow up again next week telephonically (part of transition of care).  Michael Schmitt.   [place] Care Management  [phone]

## 2016-05-04 ENCOUNTER — Ambulatory Visit: Payer: Medicare Other | Admitting: *Deleted

## 2016-05-04 ENCOUNTER — Other Ambulatory Visit: Payer: Self-pay | Admitting: *Deleted

## 2016-05-04 NOTE — Patient Outreach (Signed)
Attempt made to contact pt as part of transition of care,ongoing follow up on recent hospitalization 1/10-1/12 for MI (STEMI), stent placement.  HIPAA compliant voice message left with contact name and number.   Plan:  If no response, plan to follow up again in next 4 days.      Zara Chess.   [place] Care Management  [phone]

## 2016-05-05 DIAGNOSIS — Z79899 Other long term (current) drug therapy: Secondary | ICD-10-CM | POA: Diagnosis not present

## 2016-05-06 ENCOUNTER — Emergency Department
Admission: EM | Admit: 2016-05-06 | Discharge: 2016-05-06 | Disposition: A | Discharge status: Home / Self Care | Payer: Medicare Other | Attending: Emergency Medicine | Admitting: Emergency Medicine

## 2016-05-06 ENCOUNTER — Encounter: Payer: Self-pay | Admitting: Emergency Medicine

## 2016-05-06 DIAGNOSIS — Z7982 Long term (current) use of aspirin: Secondary | ICD-10-CM | POA: Insufficient documentation

## 2016-05-06 DIAGNOSIS — E119 Type 2 diabetes mellitus without complications: Secondary | ICD-10-CM | POA: Insufficient documentation

## 2016-05-06 DIAGNOSIS — I2510 Atherosclerotic heart disease of native coronary artery without angina pectoris: Secondary | ICD-10-CM | POA: Diagnosis not present

## 2016-05-06 DIAGNOSIS — F1099 Alcohol use, unspecified with unspecified alcohol-induced disorder: Secondary | ICD-10-CM | POA: Insufficient documentation

## 2016-05-06 DIAGNOSIS — I10 Essential (primary) hypertension: Secondary | ICD-10-CM | POA: Insufficient documentation

## 2016-05-06 DIAGNOSIS — F17290 Nicotine dependence, other tobacco product, uncomplicated: Secondary | ICD-10-CM | POA: Diagnosis not present

## 2016-05-06 DIAGNOSIS — F17210 Nicotine dependence, cigarettes, uncomplicated: Secondary | ICD-10-CM | POA: Diagnosis not present

## 2016-05-06 DIAGNOSIS — F329 Major depressive disorder, single episode, unspecified: Secondary | ICD-10-CM | POA: Insufficient documentation

## 2016-05-06 DIAGNOSIS — Y900 Blood alcohol level of less than 20 mg/100 ml: Secondary | ICD-10-CM | POA: Diagnosis not present

## 2016-05-06 DIAGNOSIS — IMO0002 Reserved for concepts with insufficient information to code with codable children: Secondary | ICD-10-CM

## 2016-05-06 DIAGNOSIS — F32A Depression, unspecified: Secondary | ICD-10-CM

## 2016-05-06 DIAGNOSIS — Z79899 Other long term (current) drug therapy: Secondary | ICD-10-CM | POA: Diagnosis not present

## 2016-05-06 LAB — URINE DRUG SCREEN, QUALITATIVE (ARMC ONLY)
Amphetamines, Ur Screen: NOT DETECTED
BARBITURATES, UR SCREEN: NOT DETECTED
Benzodiazepine, Ur Scrn: NOT DETECTED
Cocaine Metabolite,Ur ~~LOC~~: NOT DETECTED
MDMA (Ecstasy)Ur Screen: NOT DETECTED
METHADONE SCREEN, URINE: NOT DETECTED
OPIATE, UR SCREEN: NOT DETECTED
Phencyclidine (PCP) Ur S: NOT DETECTED
Tricyclic, Ur Screen: NOT DETECTED

## 2016-05-06 LAB — COMPREHENSIVE METABOLIC PANEL
ALBUMIN: 3.9 g/dL (ref 3.5–5.0)
ALK PHOS: 138 U/L — AB (ref 38–126)
ALT: 30 U/L (ref 17–63)
ANION GAP: 11 (ref 5–15)
AST: 44 U/L — ABNORMAL HIGH (ref 15–41)
BUN: 9 mg/dL (ref 6–20)
CALCIUM: 9 mg/dL (ref 8.9–10.3)
CO2: 21 mmol/L — AB (ref 22–32)
CREATININE: 1.17 mg/dL (ref 0.61–1.24)
Chloride: 104 mmol/L (ref 101–111)
GFR calc Af Amer: >60 mL/min (ref >60)
GFR calc non Af Amer: >60 mL/min (ref >60)
Glucose, Bld: 136 mg/dL — ABNORMAL HIGH (ref 65–99)
Potassium: 4.4 mmol/L (ref 3.5–5.1)
SODIUM: 136 mmol/L (ref 135–145)
Total Bilirubin: 0.5 mg/dL (ref 0.3–1.2)
Total Protein: 7.5 g/dL (ref 6.5–8.1)

## 2016-05-06 LAB — CBC
HEMATOCRIT: 46.3 % (ref 40.0–52.0)
HEMOGLOBIN: 16.1 g/dL (ref 13.0–18.0)
MCH: 31.3 pg (ref 26.0–34.0)
MCHC: 34.7 g/dL (ref 32.0–36.0)
MCV: 90 fL (ref 80.0–100.0)
Platelets: 210 10*3/uL (ref 150–440)
RBC: 5.14 MIL/uL (ref 4.40–5.90)
RDW: 13.8 % (ref 11.5–14.5)
WBC: 10.8 10*3/uL — ABNORMAL HIGH (ref 3.8–10.6)

## 2016-05-06 LAB — ETHANOL: Alcohol, Ethyl (B): <5 mg/dL (ref <5)

## 2016-05-06 LAB — ACETAMINOPHEN LEVEL

## 2016-05-06 LAB — SALICYLATE LEVEL: Salicylate Lvl: 17.2 mg/dL (ref 2.8–30.0)

## 2016-05-06 NOTE — ED Triage Notes (Signed)
Pt to ed with c/o wanting to go through detox for klonopin and alcohol.  Pt states he stopped using klonopin yesterday and now is going to withdrawal.  Pt states "he wanted me to go to [place] but I don't have a ride"  Pt reports si, depression, denies HI.

## 2016-05-06 NOTE — ED Notes (Signed)
BEHAVIORAL HEALTH ROUNDING Patient sleeping: No. Patient alert and oriented: yes Behavior appropriate: Yes.  ; If no, describe:  Nutrition and fluids offered: Yes  Toileting and hygiene offered: Yes  Sitter present: not applicable Law enforcement present: Yes  

## 2016-05-06 NOTE — Discharge Instructions (Signed)
You have been seen in the emergency department for a  psychiatric concern. You have been evaluated both medically as well as psychiatrically. Please follow-up with your outpatient resources provided. Return to the emergency department for any worsening symptoms, or any thoughts of hurting yourself or anyone else so that we may attempt to help you. 

## 2016-05-06 NOTE — ED Notes (Signed)

## 2016-05-06 NOTE — ED Notes (Signed)
Patient requests discharge. Instructions given. Patient discharged ambulatory.

## 2016-05-06 NOTE — ED Provider Notes (Signed)
[facility] Emergency Department Provider Note  Time seen: 11:34 AM  I have reviewed the triage vital signs and the nursing notes.   HISTORY  Chief Complaint Addiction Problem    HPI Michael Schmitt is a 46 y.o. male with a past medical history of anxiety, hypertension, hyperlipidemia, presents to the emergency department with depression and Klonopin withdrawal. According to the patient his psychiatrist abruptly stopped his Klonopin 2 days ago. Told him to go to the hospital for an inpatient detox per patient. Patient states he admitted to the psychiatrist that he was occasionally drinking alcohol which prompted them to discontinue the Klonopin. Patient states his last alcohol drink was last night. Denies anything today. Denies any recreational drug use. Patient states he is feeling depressed but specifically denies any SI or HI.  Past Medical History:  Diagnosis Date  . Anxiety   . Anxiety   . Arthritis    cerv. stenosis, spondylosis, HNP- lower back , has been followed in pain clinic, has  had injection s in cerv. area  . Blood dyscrasia    told that when he was young he was a" free bleeder"  . CAD (coronary artery disease)   . Cervical spondylosis without myelopathy 07/24/2014  . Cervicogenic headache 07/24/2014  . Chronic kidney disease    renal calculi- passed spontaneously  . Depression   . Diabetes mellitus without complication ([place])   . Fatty liver   . GERD (gastroesophageal reflux disease)   . Headache(784.0)   . Hyperlipidemia   . Hypertension   . Mental disorder   . MI, old   . RLS (restless legs syndrome)    detected on sleep study  . Shortness of breath   . Ventricular fibrillation ([place]) 04/05/2016    Patient Active Problem List   Diagnosis Date Noted  . Ventricular fibrillation ([place]) 04/05/2016  . Coronary stent thrombosis   . Generalized anxiety disorder 08/11/2015  . Major depressive disorder, recurrent episode, moderate with  anxious distress ([place]) 06/21/2015  . NSTEMI (non-ST elevated myocardial infarction) ([place]) 06/20/2015  . Acute ST elevation myocardial infarction (STEMI) involving left anterior descending (LAD) coronary artery ([place]) 01/01/2015  . Acute MI, inferoposterior wall ([place]) 01/01/2015  . Common bile duct dilation 10/06/2014  . Right lower quadrant abdominal pain 10/06/2014  . Loss of weight 10/06/2014  . Cervicogenic headache 07/24/2014  . Cervical spondylosis without myelopathy 07/24/2014  . Clinical depression 04/15/2013  . Acid reflux 04/15/2013  . BP (high blood pressure) 04/15/2013  . Encounter for other preprocedural examination 04/15/2013  . Nasal obstruction 02/12/2013  . Inflamed nasal mucosa 02/12/2013  . Sinus infection 02/12/2013  . Gastric catarrh 03/01/2011  . Allergic rhinitis 10/20/2010  . Airway hyperreactivity 10/20/2010  . Anxiety, generalized 10/20/2010  . Cervical pain 10/20/2010  . Current tobacco use 10/20/2010    Past Surgical History:  Procedure Laterality Date  . ANTERIOR CERVICAL DECOMP/DISCECTOMY FUSION  11/13/2011   Procedure: ANTERIOR CERVICAL DECOMPRESSION/DISCECTOMY FUSION 1 LEVEL;  Surgeon: [name], MD;  Location: [facility]URO ORS;  Service: Neurosurgery;  Laterality: N/A;  Anterior Cervical Decompression/discectomy Fusion. Cervical three-four.  Marland Kitchen CARDIAC CATHETERIZATION N/A 01/01/2015   Procedure: Left Heart Cath and Coronary Angiography;  Surgeon: [name], MD;  Location: [facility] CV LAB;  Service: Cardiovascular;  Laterality: N/A;  . CARDIAC CATHETERIZATION N/A 04/05/2016   Procedure: Left Heart Cath and Coronary Angiography;  Surgeon: Belva Crome, MD;  Location: [place] CV LAB;  Service: Cardiovascular;  Laterality:  N/A;  . CARDIAC CATHETERIZATION N/A 04/05/2016   Procedure: Coronary Stent Intervention;  Surgeon: Belva Crome, MD;  Location: [place] CV LAB;  Service: Cardiovascular;  Laterality: N/A;  . CARDIAC CATHETERIZATION N/A  04/05/2016   Procedure: Intravascular Ultrasound/IVUS;  Surgeon: Belva Crome, MD;  Location: [place] CV LAB;  Service: Cardiovascular;  Laterality: N/A;  . NASAL SINUS SURGERY     2005    Prior to Admission medications   Medication Sig Start Date End Date Taking? Authorizing Provider  aspirin EC 81 MG EC tablet Take 1 tablet (81 mg total) by mouth daily. Patient not taking: Reported on 04/12/2016 04/08/16   Brittainy M Rosita Fire, PA-C  atorvastatin (LIPITOR) 80 MG tablet Take 1 tablet (80 mg total) by mouth daily at 6 PM. 04/07/16   Brittainy M Rosita Fire, PA-C  carvedilol (COREG) 6.25 MG tablet Take 1 tablet (6.25 mg total) by mouth 2 (two) times daily with a meal. 04/07/16   Brittainy Erie Noe, PA-C  clonazePAM (KLONOPIN) 1 MG tablet Take 1 tablet (1 mg total) by mouth 3 (three) times daily as needed for anxiety. Patient taking differently: Take 2 mg by mouth 3 (three) times daily as needed for anxiety.  03/26/16 04/05/16  Delman Kitten, MD  dexlansoprazole (DEXILANT) 60 MG capsule Take 60 mg by mouth daily.    Historical Provider, MD  FLUoxetine (PROZAC) 20 MG capsule Take 1 capsule (20 mg total) by mouth at bedtime. 03/26/16 04/05/16  Delman Kitten, MD  lisinopril (PRINIVIL,ZESTRIL) 5 MG tablet Take 1 tablet (5 mg total) by mouth daily. 06/21/15   Bettey Costa, MD  mirtazapine (REMERON) 30 MG tablet Take 30 mg by mouth at bedtime. 03/07/16   Historical Provider, MD  nicotine (NICODERM CQ - DOSED IN MG/24 HOURS) 21 mg/24hr patch Place 1 patch (21 mg total) onto the skin daily. Patient not taking: Reported on 04/12/2016 04/08/16   Brittainy M Rosita Fire, PA-C  nitroGLYCERIN (NITROSTAT) 0.4 MG SL tablet Place 1 tablet (0.4 mg total) under the tongue every 5 (five) minutes x 3 doses as needed for chest pain. Patient not taking: Reported on 04/12/2016 04/07/16   Brittainy M Rosita Fire, PA-C  OLANZapine (ZYPREXA) 5 MG tablet Take 1 tablet (5 mg total) by mouth at bedtime. 03/26/16 04/02/16  Delman Kitten, MD  ticagrelor  (BRILINTA) 90 MG TABS tablet Take 1 tablet (90 mg total) by mouth 2 (two) times daily. 04/07/16   Brittainy Erie Noe, PA-C  ticagrelor (BRILINTA) 90 MG TABS tablet Take 1 tablet (90 mg total) by mouth 2 (two) times daily. 04/07/16   Brittainy Erie Noe, PA-C    Allergies  Allergen Reactions  . Prednisone Other (See Comments)    Hypertension, makes him feel spacey  . Hydrocodone Other (See Comments)    Headache, irritable  . Varenicline Other (See Comments)    Suicidal thoughts    Family History  Problem Relation Age of Onset  . Prostate cancer Father   . Hypertension Mother   . Kidney Stones Mother   . Anxiety disorder Mother   . Depression Mother   . COPD Sister   . Hypertension Sister   . Diabetes Sister   . Depression Sister   . Anxiety disorder Sister   . Seizures Sister     Social History Social History  Substance Use Topics  . Smoking status: Current Every Day Smoker    Packs/day: 2.00    Years: 17.00    Types: Cigarettes, Cigars  . Smokeless tobacco: Current  User     Comment: occassional snuff   . Alcohol use No     Comment: trying not to drink     Review of Systems Constitutional: Negative for fever. Cardiovascular: Negative for chest pain. Respiratory: Negative for shortness of breath.Occasional cough Gastrointestinal: Negative for abdominal pain Neurological: Negative for headache 10-point ROS otherwise negative.  ____________________________________________   PHYSICAL EXAM:  VITAL SIGNS: ED Triage Vitals  Enc Vitals Group     BP 05/06/16 1049 (!) 144/100     Pulse Rate 05/06/16 1049 70     Resp 05/06/16 1049 20     Temp 05/06/16 1049 98.3 F (36.8 C)     Temp Source 05/06/16 1049 Oral     SpO2 05/06/16 1049 100 %     Weight 05/06/16 1049 204 lb (92.5 kg)     Height --      Head Circumference --      Peak Flow --      Pain Score 05/06/16 1050 0     Pain Loc --      Pain Edu? --      Excl. in [place]? --     Constitutional: Alert and  oriented. Well appearing and in no distress. Eyes: Normal exam ENT   Head: Normocephalic and atraumatic   Mouth/Throat: Mucous membranes are moist. Cardiovascular: Normal rate, regular rhythm. No murmur Respiratory: Normal respiratory effort without tachypnea nor retractions. Breath sounds are clear Gastrointestinal: Soft and nontender. No distention.  Musculoskeletal: Nontender with normal range of motion in all extremities. Neurologic:  Normal speech and language. No gross focal neurologic deficits  Skin:  Skin is warm, dry and intact.  Psychiatric: Admits depression, somewhat flat affect, denies SI or HI  ____________________________________________    INITIAL IMPRESSION / ASSESSMENT AND PLAN / ED COURSE  Pertinent labs & imaging results that were available during my care of the patient were reviewed by me and considered in my medical decision making (see chart for details).  The patient presents to the emergency department with depression and Klonopin withdrawal. Patient states his psychiatrist took him off Klonopin 2 days ago and since he has been withdrawing. I discussed with the patient possible detox which he is requesting. He states worsening depression but specifically denies any SI or HI.  Patient is now requesting to leave the emergency department. States he no longer wishes to detox. As the patient is not under IVC, denies SI or HI, we will release from the emergency department. Medical workup has been largely nonrevealing.  ____________________________________________   FINAL CLINICAL IMPRESSION(S) / ED DIAGNOSES  Depression Benzodiazepine withdrawal Alcohol use disorder    Harvest Dark, MD 05/06/16 1334

## 2016-05-06 NOTE — ED Notes (Signed)
Sitting in bed watching TV, lunch served.

## 2016-05-07 ENCOUNTER — Emergency Department
Admission: EM | Admit: 2016-05-07 | Discharge: 2016-05-07 | Disposition: A | Discharge status: Home / Self Care | Payer: Medicare Other | Attending: Emergency Medicine | Admitting: Emergency Medicine

## 2016-05-07 ENCOUNTER — Encounter: Payer: Self-pay | Admitting: Emergency Medicine

## 2016-05-07 DIAGNOSIS — IMO0002 Reserved for concepts with insufficient information to code with codable children: Secondary | ICD-10-CM

## 2016-05-07 DIAGNOSIS — Z955 Presence of coronary angioplasty implant and graft: Secondary | ICD-10-CM | POA: Diagnosis not present

## 2016-05-07 DIAGNOSIS — E1122 Type 2 diabetes mellitus with diabetic chronic kidney disease: Secondary | ICD-10-CM | POA: Insufficient documentation

## 2016-05-07 DIAGNOSIS — F1320 Sedative, hypnotic or anxiolytic dependence, uncomplicated: Secondary | ICD-10-CM | POA: Insufficient documentation

## 2016-05-07 DIAGNOSIS — Z7982 Long term (current) use of aspirin: Secondary | ICD-10-CM | POA: Insufficient documentation

## 2016-05-07 DIAGNOSIS — F17210 Nicotine dependence, cigarettes, uncomplicated: Secondary | ICD-10-CM | POA: Diagnosis not present

## 2016-05-07 DIAGNOSIS — N189 Chronic kidney disease, unspecified: Secondary | ICD-10-CM | POA: Diagnosis not present

## 2016-05-07 DIAGNOSIS — F17290 Nicotine dependence, other tobacco product, uncomplicated: Secondary | ICD-10-CM | POA: Diagnosis not present

## 2016-05-07 DIAGNOSIS — Z79899 Other long term (current) drug therapy: Secondary | ICD-10-CM | POA: Insufficient documentation

## 2016-05-07 DIAGNOSIS — I10 Essential (primary) hypertension: Secondary | ICD-10-CM | POA: Diagnosis not present

## 2016-05-07 DIAGNOSIS — F1010 Alcohol abuse, uncomplicated: Secondary | ICD-10-CM | POA: Insufficient documentation

## 2016-05-07 DIAGNOSIS — I2510 Atherosclerotic heart disease of native coronary artery without angina pectoris: Secondary | ICD-10-CM | POA: Insufficient documentation

## 2016-05-07 DIAGNOSIS — I129 Hypertensive chronic kidney disease with stage 1 through stage 4 chronic kidney disease, or unspecified chronic kidney disease: Secondary | ICD-10-CM | POA: Insufficient documentation

## 2016-05-07 DIAGNOSIS — Y909 Presence of alcohol in blood, level not specified: Secondary | ICD-10-CM | POA: Diagnosis not present

## 2016-05-07 DIAGNOSIS — R05 Cough: Secondary | ICD-10-CM | POA: Diagnosis present

## 2016-05-07 DIAGNOSIS — G4700 Insomnia, unspecified: Secondary | ICD-10-CM | POA: Diagnosis not present

## 2016-05-07 DIAGNOSIS — R110 Nausea: Secondary | ICD-10-CM | POA: Diagnosis not present

## 2016-05-07 DIAGNOSIS — I252 Old myocardial infarction: Secondary | ICD-10-CM | POA: Diagnosis not present

## 2016-05-07 DIAGNOSIS — K219 Gastro-esophageal reflux disease without esophagitis: Secondary | ICD-10-CM | POA: Diagnosis not present

## 2016-05-07 NOTE — ED Provider Notes (Signed)
[facility] Emergency Department Provider Note  Time seen: 7:20 AM  I have reviewed the triage vital signs and the nursing notes.   HISTORY  Chief Complaint No chief complaint on file.    HPI Michael Schmitt is a 46 y.o. male with a past medical history of anxiety, hypertension who presents to the emergency department hoping to detox off Klonopin. According to the patient his psychiatrist discontinued his Klonopin 2 days ago, and since he has not had any. He is now saying that his blood pressure is high, and "I just don't feel right." The patient was seen by myself Yesterday but left before he could be seen by TTS. Denies any drug use. Last alcohol use was 2 days ago. Patient is on medical complaint is a mild cough which was present when he was seen yesterday as well.  Past Medical History:  Diagnosis Date  . Anxiety   . Anxiety   . Arthritis    cerv. stenosis, spondylosis, HNP- lower back , has been followed in pain clinic, has  had injection s in cerv. area  . Blood dyscrasia    told that when he was young he was a" free bleeder"  . CAD (coronary artery disease)   . Cervical spondylosis without myelopathy 07/24/2014  . Cervicogenic headache 07/24/2014  . Chronic kidney disease    renal calculi- passed spontaneously  . Depression   . Diabetes mellitus without complication ([place])   . Fatty liver   . GERD (gastroesophageal reflux disease)   . Headache(784.0)   . Hyperlipidemia   . Hypertension   . Mental disorder   . MI, old   . RLS (restless legs syndrome)    detected on sleep study  . Shortness of breath   . Ventricular fibrillation ([place]) 04/05/2016    Patient Active Problem List   Diagnosis Date Noted  . Ventricular fibrillation ([place]) 04/05/2016  . Coronary stent thrombosis   . Generalized anxiety disorder 08/11/2015  . Major depressive disorder, recurrent episode, moderate with anxious distress ([place]) 06/21/2015  . NSTEMI (non-ST elevated  myocardial infarction) ([place]) 06/20/2015  . Acute ST elevation myocardial infarction (STEMI) involving left anterior descending (LAD) coronary artery ([place]) 01/01/2015  . Acute MI, inferoposterior wall ([place]) 01/01/2015  . Common bile duct dilation 10/06/2014  . Right lower quadrant abdominal pain 10/06/2014  . Loss of weight 10/06/2014  . Cervicogenic headache 07/24/2014  . Cervical spondylosis without myelopathy 07/24/2014  . Clinical depression 04/15/2013  . Acid reflux 04/15/2013  . BP (high blood pressure) 04/15/2013  . Encounter for other preprocedural examination 04/15/2013  . Nasal obstruction 02/12/2013  . Inflamed nasal mucosa 02/12/2013  . Sinus infection 02/12/2013  . Gastric catarrh 03/01/2011  . Allergic rhinitis 10/20/2010  . Airway hyperreactivity 10/20/2010  . Anxiety, generalized 10/20/2010  . Cervical pain 10/20/2010  . Current tobacco use 10/20/2010    Past Surgical History:  Procedure Laterality Date  . ANTERIOR CERVICAL DECOMP/DISCECTOMY FUSION  11/13/2011   Procedure: ANTERIOR CERVICAL DECOMPRESSION/DISCECTOMY FUSION 1 LEVEL;  Surgeon: [name], MD;  Location: [place] NEURO ORS;  Service: Neurosurgery;  Laterality: N/A;  Anterior Cervical Decompression/discectomy Fusion. Cervical three-four.  Marland Kitchen CARDIAC CATHETERIZATION N/A 01/01/2015   Procedure: Left Heart Cath and Coronary Angiography;  Surgeon: [name], MD;  Location: [place] CV LAB;  Service: Cardiovascular;  Laterality: N/A;  . CARDIAC CATHETERIZATION N/A 04/05/2016   Procedure: Left Heart Cath and Coronary Angiography;  Surgeon: Belva Crome, MD;  Location: [facility]  INVASIVE CV LAB;  Service: Cardiovascular;  Laterality: N/A;  . CARDIAC CATHETERIZATION N/A 04/05/2016   Procedure: Coronary Stent Intervention;  Surgeon: Belva Crome, MD;  Location: [place] CV LAB;  Service: Cardiovascular;  Laterality: N/A;  . CARDIAC CATHETERIZATION N/A 04/05/2016   Procedure: Intravascular Ultrasound/IVUS;  Surgeon: Belva Crome, MD;  Location: [place] CV LAB;  Service: Cardiovascular;  Laterality: N/A;  . NASAL SINUS SURGERY     2005    Prior to Admission medications   Medication Sig Start Date End Date Taking? Authorizing Provider  aspirin EC 81 MG EC tablet Take 1 tablet (81 mg total) by mouth daily. Patient not taking: Reported on 04/12/2016 04/08/16   Brittainy M Rosita Fire, PA-C  atorvastatin (LIPITOR) 80 MG tablet Take 1 tablet (80 mg total) by mouth daily at 6 PM. 04/07/16   Brittainy M Rosita Fire, PA-C  carvedilol (COREG) 6.25 MG tablet Take 1 tablet (6.25 mg total) by mouth 2 (two) times daily with a meal. 04/07/16   Brittainy Erie Noe, PA-C  clonazePAM (KLONOPIN) 1 MG tablet Take 1 tablet (1 mg total) by mouth 3 (three) times daily as needed for anxiety. Patient taking differently: Take 2 mg by mouth 3 (three) times daily as needed for anxiety.  03/26/16 04/05/16  Delman Kitten, MD  dexlansoprazole (DEXILANT) 60 MG capsule Take 60 mg by mouth daily.    Historical Provider, MD  FLUoxetine (PROZAC) 20 MG capsule Take 1 capsule (20 mg total) by mouth at bedtime. 03/26/16 04/05/16  Delman Kitten, MD  lisinopril (PRINIVIL,ZESTRIL) 5 MG tablet Take 1 tablet (5 mg total) by mouth daily. 06/21/15   Bettey Costa, MD  mirtazapine (REMERON) 30 MG tablet Take 30 mg by mouth at bedtime. 03/07/16   Historical Provider, MD  nicotine (NICODERM CQ - DOSED IN MG/24 HOURS) 21 mg/24hr patch Place 1 patch (21 mg total) onto the skin daily. Patient not taking: Reported on 04/12/2016 04/08/16   Brittainy M Rosita Fire, PA-C  nitroGLYCERIN (NITROSTAT) 0.4 MG SL tablet Place 1 tablet (0.4 mg total) under the tongue every 5 (five) minutes x 3 doses as needed for chest pain. Patient not taking: Reported on 04/12/2016 04/07/16   Brittainy M Rosita Fire, PA-C  OLANZapine (ZYPREXA) 5 MG tablet Take 1 tablet (5 mg total) by mouth at bedtime. 03/26/16 04/02/16  Delman Kitten, MD  ticagrelor (BRILINTA) 90 MG TABS tablet Take 1 tablet (90 mg total) by mouth 2 (two)  times daily. 04/07/16   Brittainy Erie Noe, PA-C  ticagrelor (BRILINTA) 90 MG TABS tablet Take 1 tablet (90 mg total) by mouth 2 (two) times daily. 04/07/16   Brittainy Erie Noe, PA-C    Allergies  Allergen Reactions  . Prednisone Other (See Comments)    Hypertension, makes him feel spacey  . Hydrocodone Other (See Comments)    Headache, irritable  . Varenicline Other (See Comments)    Suicidal thoughts    Family History  Problem Relation Age of Onset  . Prostate cancer Father   . Hypertension Mother   . Kidney Stones Mother   . Anxiety disorder Mother   . Depression Mother   . COPD Sister   . Hypertension Sister   . Diabetes Sister   . Depression Sister   . Anxiety disorder Sister   . Seizures Sister     Social History Social History  Substance Use Topics  . Smoking status: Current Every Day Smoker    Packs/day: 2.00    Years: 17.00  Types: Cigarettes, Cigars  . Smokeless tobacco: Current User     Comment: occassional snuff   . Alcohol use No     Comment: trying not to drink     Review of Systems Constitutional: Negative for fever. Cardiovascular: Negative for chest pain. Respiratory: Negative for shortness of breath.Occasional cough. Gastrointestinal: Negative for abdominal pain Neurological: Negative for headache 10-point ROS otherwise negative.  ____________________________________________   PHYSICAL EXAM:  Constitutional: Alert and oriented. Well appearing and in no distress. Eyes: Normal exam ENT   Head: Normocephalic and atraumatic.   Mouth/Throat: Mucous membranes are moist. Cardiovascular: Normal rate, regular rhythm. No murmur Respiratory: Normal respiratory effort without tachypnea nor retractions. Breath sounds are clear  Gastrointestinal: Soft and nontender. No distention. Musculoskeletal: Nontender with normal range of motion in all extremities. Neurologic:  Normal speech and language. No gross focal neurologic deficits  Skin:   Skin is warm, dry and intact.  Psychiatric: Mood and affect are normal. Denies SI or HI.  ____________________________________________   INITIAL IMPRESSION / ASSESSMENT AND PLAN / ED COURSE  Pertinent labs & imaging results that were available during my care of the patient were reviewed by me and considered in my medical decision making (see chart for details).  The patient presents the emergency department wishing to detox from Klonopin and alcohol. Patient denies SI or HI. Patient was seen here for the same yesterday but after being told that we would not be prescribing his Klonopin he decided to leave. He is back today hoping to detox from [place]. We will discuss with TTS for detox options. Labs were performed yesterday, we will repeat if needed for detox.  Patient has been provided outpatient resources for detox by TTS. We will discharge the patient home at this time.  ____________________________________________   FINAL CLINICAL IMPRESSION(S) / ED DIAGNOSES  Benzodiazepine detox Alcohol detox    Harvest Dark, MD 05/07/16 [phone]

## 2016-05-07 NOTE — ED Notes (Signed)
Encouraged patient to follow up with referrals for treatment centers. Discussed going to [name] and [name]. Pt to lobby to call for a ride home.  Denies any SI/HI at this time. States he wants to be back on his Klonopin.

## 2016-05-07 NOTE — BH Assessment (Signed)
Per the request of ER MD (Dr. Wynonia Hazard) and patient is able to discharge home when medically cleared. Patient was giving referral information and instructions on how to follow up with Inpatient & Outpatient Treatment for his substance use.  Patient denies SI/HI and AV/H.    Behavioral Health Referral Facilities [facility] Providers Out Patient Treatment  [facility] Patient Treatment  [facility]ox [facility] SA Treatment Inpatient Address: [address]  [place], [place] [address] [phone] Trixie Deis ([address]) [place],  [place], [place] [address] [phone](P) Detox & SA Treatment Inpatient  [facility] [address],  [place], [place] [address] [phone] Inpatient SA Treatment

## 2016-05-07 NOTE — Discharge Instructions (Signed)
Please follow-up with the outpatient resources provided by TTS. Return to the emergency department for any symptoms personally concerning to yourself.

## 2016-05-07 NOTE — ED Triage Notes (Addendum)
Pt arrived via EMS from home with reports of being depressed and not having Klonopin to take.  Pt has been on Klonopin x 10 years and his PCP took him off on Friday because the patient continues to drink. Pt is wanting information on a detox program. No etoh use in 2-3 days.

## 2016-05-08 ENCOUNTER — Other Ambulatory Visit: Payer: Self-pay | Admitting: *Deleted

## 2016-05-08 ENCOUNTER — Encounter: Payer: Self-pay | Admitting: *Deleted

## 2016-05-08 NOTE — Patient Outreach (Addendum)
Transition of care call done, follow  up on recent hospitalization 1/10-1/12 for MI (STEMI) stent placement.   Spoke with Olin Hauser (pt's sister, on consent form), HIPAA/identity verified on pt (verified pt's change of address [address] [place] [address])  Sister  reports pt not available, in detox - went last night to [place] at [facility], not sure when he will come home.  Discussed with sister pt calling RN CM when discharged home.    Plan:  Update to  [facility] LCSW  On pt going to [facility].             Plan to discharge pt from community nurse case management services, close case (pt  enrolled             In external program, does not meet [facility] program criteria).            Plan to fax Dr. Clayborn Bigness case closure letter.            Case closure letter to be sent to pt's home.                Zara Chess.   [facility] Care Management  [phone]

## 2016-05-09 NOTE — Progress Notes (Signed)
This encounter was created in error - please disregard.

## 2016-05-11 ENCOUNTER — Encounter: Payer: Self-pay | Admitting: Emergency Medicine

## 2016-05-11 ENCOUNTER — Ambulatory Visit: Payer: Medicare Other | Admitting: *Deleted

## 2016-05-11 ENCOUNTER — Other Ambulatory Visit: Payer: Self-pay | Admitting: *Deleted

## 2016-05-11 ENCOUNTER — Emergency Department
Admission: EM | Admit: 2016-05-11 | Discharge: 2016-05-11 | Disposition: A | Discharge status: Home / Self Care | Payer: Medicare Other | Attending: Emergency Medicine | Admitting: Emergency Medicine

## 2016-05-11 DIAGNOSIS — E1122 Type 2 diabetes mellitus with diabetic chronic kidney disease: Secondary | ICD-10-CM | POA: Diagnosis not present

## 2016-05-11 DIAGNOSIS — Z7982 Long term (current) use of aspirin: Secondary | ICD-10-CM | POA: Insufficient documentation

## 2016-05-11 DIAGNOSIS — F17210 Nicotine dependence, cigarettes, uncomplicated: Secondary | ICD-10-CM | POA: Insufficient documentation

## 2016-05-11 DIAGNOSIS — Z79899 Other long term (current) drug therapy: Secondary | ICD-10-CM | POA: Diagnosis not present

## 2016-05-11 DIAGNOSIS — N189 Chronic kidney disease, unspecified: Secondary | ICD-10-CM | POA: Diagnosis not present

## 2016-05-11 DIAGNOSIS — R109 Unspecified abdominal pain: Secondary | ICD-10-CM | POA: Diagnosis not present

## 2016-05-11 DIAGNOSIS — F13230 Sedative, hypnotic or anxiolytic dependence with withdrawal, uncomplicated: Secondary | ICD-10-CM | POA: Insufficient documentation

## 2016-05-11 DIAGNOSIS — I129 Hypertensive chronic kidney disease with stage 1 through stage 4 chronic kidney disease, or unspecified chronic kidney disease: Secondary | ICD-10-CM | POA: Diagnosis not present

## 2016-05-11 DIAGNOSIS — F17290 Nicotine dependence, other tobacco product, uncomplicated: Secondary | ICD-10-CM | POA: Insufficient documentation

## 2016-05-11 DIAGNOSIS — F13930 Sedative, hypnotic or anxiolytic use, unspecified with withdrawal, uncomplicated: Secondary | ICD-10-CM

## 2016-05-11 DIAGNOSIS — F419 Anxiety disorder, unspecified: Secondary | ICD-10-CM | POA: Diagnosis present

## 2016-05-11 DIAGNOSIS — I2510 Atherosclerotic heart disease of native coronary artery without angina pectoris: Secondary | ICD-10-CM | POA: Insufficient documentation

## 2016-05-11 LAB — URINALYSIS, COMPLETE (UACMP) WITH MICROSCOPIC
Bilirubin Urine: NEGATIVE
GLUCOSE, UA: NEGATIVE mg/dL
Hgb urine dipstick: NEGATIVE
Ketones, ur: 5 mg/dL — AB
Leukocytes, UA: NEGATIVE
Nitrite: NEGATIVE
PH: 5 (ref 5.0–8.0)
Protein, ur: 30 mg/dL — AB
SPECIFIC GRAVITY, URINE: 1.031 — AB (ref 1.005–1.030)

## 2016-05-11 LAB — COMPREHENSIVE METABOLIC PANEL
ALK PHOS: 102 U/L (ref 38–126)
ALT: 25 U/L (ref 17–63)
ANION GAP: 9 (ref 5–15)
AST: 29 U/L (ref 15–41)
Albumin: 4 g/dL (ref 3.5–5.0)
BILIRUBIN TOTAL: 0.5 mg/dL (ref 0.3–1.2)
BUN: 7 mg/dL (ref 6–20)
CHLORIDE: 105 mmol/L (ref 101–111)
CO2: 25 mmol/L (ref 22–32)
Calcium: 9.1 mg/dL (ref 8.9–10.3)
Creatinine, Ser: 0.83 mg/dL (ref 0.61–1.24)
GLUCOSE: 132 mg/dL — AB (ref 65–99)
Potassium: 3.8 mmol/L (ref 3.5–5.1)
SODIUM: 139 mmol/L (ref 135–145)
Total Protein: 7.7 g/dL (ref 6.5–8.1)

## 2016-05-11 LAB — LIPASE, BLOOD: Lipase: 37 U/L (ref 11–51)

## 2016-05-11 LAB — CBC
HEMATOCRIT: 44.9 % (ref 40.0–52.0)
Hemoglobin: 15.6 g/dL (ref 13.0–18.0)
MCH: 30.9 pg (ref 26.0–34.0)
MCHC: 34.7 g/dL (ref 32.0–36.0)
MCV: 88.9 fL (ref 80.0–100.0)
Platelets: 234 10*3/uL (ref 150–440)
RBC: 5.06 MIL/uL (ref 4.40–5.90)
RDW: 14.6 % — ABNORMAL HIGH (ref 11.5–14.5)
WBC: 6.7 10*3/uL (ref 3.8–10.6)

## 2016-05-11 MED ORDER — DIPHENHYDRAMINE HCL 25 MG PO CAPS
50.0000 mg | ORAL_CAPSULE | Freq: Once | ORAL | Status: AC
  Administered 2016-05-11: 50 mg via ORAL

## 2016-05-11 MED ORDER — DIPHENHYDRAMINE HCL 25 MG PO CAPS
ORAL_CAPSULE | ORAL | Status: AC
  Filled 2016-05-11: qty 2

## 2016-05-11 MED ORDER — IBUPROFEN 400 MG PO TABS
ORAL_TABLET | ORAL | Status: AC
  Administered 2016-05-11: 400 mg
  Filled 2016-05-11: qty 1

## 2016-05-11 MED ORDER — ONDANSETRON 4 MG PO TBDP
4.0000 mg | ORAL_TABLET | Freq: Once | ORAL | Status: AC
  Administered 2016-05-11: 4 mg via ORAL
  Filled 2016-05-11: qty 1

## 2016-05-11 NOTE — ED Triage Notes (Addendum)
Pt arrived to triage by EMS, ambulatory and from home. EMS reports pt [facility], pt sts he was taken off his klonopin and feels he is going through withdrawal symptoms. Pt c/o abdominal pain, headaches and malaise. Pt is A&O x4. Pt denies SI/HI at this time.

## 2016-05-11 NOTE — ED Notes (Signed)
Pt awaiting to see psych. Pt denies any thoughts of SI or HI. Sister at bedside.

## 2016-05-11 NOTE — ED Notes (Signed)
Per pt, states he was "taken off klonopin last Friday (05/05/16) while he was a pt at [facility]. Pt states he was there for "detox of alcohol and klonopin." Pt states since that time he has had "abd pain, diarrhea, feeling weak." Pt states he was on klonopin "for anxiety", denies seizures. Pt resting in bed at this time, able to answer all questions at this time.

## 2016-05-11 NOTE — ED Notes (Signed)
Pt aware that he is waiting to see Dr Weber Cooks. Pt also aware that it may this afternoon before md makes his rounds. Pt is thinking about leaving, states he cannot wait that long.  Pt up out of bed, unhooked from monitor. Pt states he can't lay in the bed any longer.  Pt given IBU for headache and neck pain which is chronic for pt.

## 2016-05-11 NOTE — ED Provider Notes (Signed)
Patient is no longer willing to wait. He does not meet any criteria for involuntary commitment. We'll discharge with outpatient follow-up. He knows he can return at any time   Lavonia Drafts, MD 05/11/16 1118

## 2016-05-11 NOTE — Patient Outreach (Signed)
[facility]) Care Management  05/11/2016 MATAN STEEN 06-12-1970 [id number]   Collaboration phone call from [place] who states that patient is currently in detox at [facility] in [place].  Per patient's sister, she is not sure when he will return.  Patient to be discharged from [facility] Management at patient is involved in another external program. RNCM has faxed closure letter's to patient and patient's  provider's office.   Sheralyn Boatman [facility] Care Management [phone]

## 2016-05-11 NOTE — ED Provider Notes (Signed)
[facility] Emergency Department Provider Note   ____________________________________________    I have reviewed the triage vital signs and the nursing notes.   HISTORY  Chief Complaint Withdrawal     HPI Michael Schmitt is a 46 y.o. male with a history of anxiety, hypertension who presents with withdrawal symptoms. Patient reports that he stopped taking Klonopin proximally one week ago and he is having diarrhea and abdominal cramping. He also reports he is anxious and feels significantly depressed. He denies suicidal ideation. He wants to speak to psychiatry. He thinks he may need to "check himself".   Past Medical History:  Diagnosis Date  . Anxiety   . Anxiety   . Arthritis    cerv. stenosis, spondylosis, HNP- lower back , has been followed in pain clinic, has  had injection s in cerv. area  . Blood dyscrasia    told that when he was young he was a" free bleeder"  . CAD (coronary artery disease)   . Cervical spondylosis without myelopathy 07/24/2014  . Cervicogenic headache 07/24/2014  . Chronic kidney disease    renal calculi- passed spontaneously  . Depression   . Diabetes mellitus without complication ([place])   . Fatty liver   . GERD (gastroesophageal reflux disease)   . Headache(784.0)   . Hyperlipidemia   . Hypertension   . Mental disorder   . MI, old   . RLS (restless legs syndrome)    detected on sleep study  . Shortness of breath   . Ventricular fibrillation ([place]) 04/05/2016    Patient Active Problem List   Diagnosis Date Noted  . Ventricular fibrillation ([place]) 04/05/2016  . Coronary stent thrombosis   . Generalized anxiety disorder 08/11/2015  . Major depressive disorder, recurrent episode, moderate with anxious distress ([place]) 06/21/2015  . NSTEMI (non-ST elevated myocardial infarction) ([place]) 06/20/2015  . Acute ST elevation myocardial infarction (STEMI) involving left anterior descending (LAD) coronary artery ([place])  01/01/2015  . Acute MI, inferoposterior wall ([place]) 01/01/2015  . Common bile duct dilation 10/06/2014  . Right lower quadrant abdominal pain 10/06/2014  . Loss of weight 10/06/2014  . Cervicogenic headache 07/24/2014  . Cervical spondylosis without myelopathy 07/24/2014  . Clinical depression 04/15/2013  . Acid reflux 04/15/2013  . BP (high blood pressure) 04/15/2013  . Encounter for other preprocedural examination 04/15/2013  . Nasal obstruction 02/12/2013  . Inflamed nasal mucosa 02/12/2013  . Sinus infection 02/12/2013  . Gastric catarrh 03/01/2011  . Allergic rhinitis 10/20/2010  . Airway hyperreactivity 10/20/2010  . Anxiety, generalized 10/20/2010  . Cervical pain 10/20/2010  . Current tobacco use 10/20/2010    Past Surgical History:  Procedure Laterality Date  . ANTERIOR CERVICAL DECOMP/DISCECTOMY FUSION  11/13/2011   Procedure: ANTERIOR CERVICAL DECOMPRESSION/DISCECTOMY FUSION 1 LEVEL;  Surgeon: [name], MD;  Location: [place] NEURO ORS;  Service: Neurosurgery;  Laterality: N/A;  Anterior Cervical Decompression/discectomy Fusion. Cervical three-four.  Marland Kitchen CARDIAC CATHETERIZATION N/A 01/01/2015   Procedure: Left Heart Cath and Coronary Angiography;  Surgeon: [name], MD;  Location: [place] CV LAB;  Service: Cardiovascular;  Laterality: N/A;  . CARDIAC CATHETERIZATION N/A 04/05/2016   Procedure: Left Heart Cath and Coronary Angiography;  Surgeon: Belva Crome, MD;  Location: [facility] CV LAB;  Service: Cardiovascular;  Laterality: N/A;  . CARDIAC CATHETERIZATION N/A 04/05/2016   Procedure: Coronary Stent Intervention;  Surgeon: Belva Crome, MD;  Location: [facility] CV LAB;  Service: Cardiovascular;  Laterality: N/A;  . CARDIAC  CATHETERIZATION N/A 04/05/2016   Procedure: Intravascular Ultrasound/IVUS;  Surgeon: Belva Crome, MD;  Location: [place] CV LAB;  Service: Cardiovascular;  Laterality: N/A;  . NASAL SINUS SURGERY     2005    Prior to Admission  medications   Medication Sig Start Date End Date Taking? Authorizing Provider  atorvastatin (LIPITOR) 80 MG tablet Take 1 tablet (80 mg total) by mouth daily at 6 PM. 04/07/16  Yes Brittainy Erie Noe, PA-C  carvedilol (COREG) 6.25 MG tablet Take 1 tablet (6.25 mg total) by mouth 2 (two) times daily with a meal. 04/07/16  Yes Brittainy Erie Noe, PA-C  dexlansoprazole (DEXILANT) 60 MG capsule Take 60 mg by mouth daily.   Yes Historical Provider, MD  FLUoxetine (PROZAC) 20 MG capsule Take 1 capsule (20 mg total) by mouth at bedtime. 03/26/16 05/11/16 Yes Delman Kitten, MD  lisinopril (PRINIVIL,ZESTRIL) 5 MG tablet Take 1 tablet (5 mg total) by mouth daily. 06/21/15  Yes Bettey Costa, MD  metoprolol tartrate (LOPRESSOR) 25 MG tablet Take 25 mg by mouth 2 (two) times daily.   Yes Historical Provider, MD  mirtazapine (REMERON) 30 MG tablet Take 30 mg by mouth at bedtime. 03/07/16  Yes Historical Provider, MD  nitroGLYCERIN (NITROSTAT) 0.4 MG SL tablet Place 1 tablet (0.4 mg total) under the tongue every 5 (five) minutes x 3 doses as needed for chest pain. 04/07/16  Yes Brittainy Erie Noe, PA-C  ticagrelor (BRILINTA) 90 MG TABS tablet Take 1 tablet (90 mg total) by mouth 2 (two) times daily. 04/07/16  Yes Brittainy Erie Noe, PA-C  aspirin EC 81 MG EC tablet Take 1 tablet (81 mg total) by mouth daily. Patient not taking: Reported on 04/12/2016 04/08/16   Brittainy M Rosita Fire, PA-C  clonazePAM (KLONOPIN) 1 MG tablet Take 1 tablet (1 mg total) by mouth 3 (three) times daily as needed for anxiety. Patient taking differently: Take 2 mg by mouth 3 (three) times daily as needed for anxiety.  03/26/16 04/05/16  Delman Kitten, MD  nicotine (NICODERM CQ - DOSED IN MG/24 HOURS) 21 mg/24hr patch Place 1 patch (21 mg total) onto the skin daily. Patient not taking: Reported on 04/12/2016 04/08/16   Brittainy M Rosita Fire, PA-C  OLANZapine (ZYPREXA) 5 MG tablet Take 1 tablet (5 mg total) by mouth at bedtime. Patient not taking: Reported  on 05/11/2016 03/26/16 04/02/16  Delman Kitten, MD     Allergies Prednisone; Hydrocodone; and Varenicline  Family History  Problem Relation Age of Onset  . Prostate cancer Father   . Hypertension Mother   . Kidney Stones Mother   . Anxiety disorder Mother   . Depression Mother   . COPD Sister   . Hypertension Sister   . Diabetes Sister   . Depression Sister   . Anxiety disorder Sister   . Seizures Sister     Social History Social History  Substance Use Topics  . Smoking status: Current Every Day Smoker    Packs/day: 2.00    Years: 17.00    Types: Cigarettes, Cigars  . Smokeless tobacco: Current User     Comment: occassional snuff   . Alcohol use No     Comment: trying not to drink     Review of Systems  Constitutional: No fever/chills  Cardiovascular: Denies chest pain. Respiratory: Denies shortness of breath. Gastrointestinal: Mild abdominal cramping, diarrhea, mild nausea Genitourinary: Negative for dysuria. Musculoskeletal: Chronic neck pain  Neurological: Negative for headaches  10-point ROS otherwise negative.  ____________________________________________   PHYSICAL  EXAM:  VITAL SIGNS: ED Triage Vitals  Enc Vitals Group     BP 05/11/16 0320 (!) 137/98     Pulse Rate 05/11/16 0320 89     Resp 05/11/16 0320 15     Temp 05/11/16 0320 98 F (36.7 C)     Temp Source 05/11/16 0320 Oral     SpO2 05/11/16 0320 100 %     Weight 05/11/16 0320 204 lb (92.5 kg)     Height 05/11/16 0320 6\' 2"  (1.88 m)     Head Circumference --      Peak Flow --      Pain Score 05/11/16 0818 10     Pain Loc --      Pain Edu? --      Excl. in [place]? --     Constitutional: Alert and oriented. No acute distress.  Eyes: Conjunctivae are normal.   Nose: No congestion/rhinnorhea.  Cardiovascular: Normal rate, regular rhythm. Grossly normal heart sounds.  Good peripheral circulation. Respiratory: Normal respiratory effort.  No retractions. Lungs CTAB. Gastrointestinal: Soft and  nontender. No distention.  No CVA tenderness. Genitourinary: deferred Musculoskeletal: No lower extremity tenderness nor edema.  Warm and well perfused Neurologic:  Normal speech and language. No gross focal neurologic deficits are appreciated.  Skin:  Skin is warm, dry and intact. No rash noted. Psychiatric: Mood and affect are normal. Speech and behavior are normal.  ____________________________________________   LABS (all labs ordered are listed, but only abnormal results are displayed)  Labs Reviewed  COMPREHENSIVE METABOLIC PANEL - Abnormal; Notable for the following:       Result Value   Glucose, Bld 132 (*)    All other components within normal limits  CBC - Abnormal; Notable for the following:    RDW 14.6 (*)    All other components within normal limits  URINALYSIS, COMPLETE (UACMP) WITH MICROSCOPIC - Abnormal; Notable for the following:    Color, Urine YELLOW (*)    APPearance TURBID (*)    Specific Gravity, Urine 1.031 (*)    Ketones, ur 5 (*)    Protein, ur 30 (*)    All other components within normal limits  LIPASE, BLOOD   ____________________________________________  EKG  None ____________________________________________  RADIOLOGY  None ____________________________________________   PROCEDURES  Procedure(s) performed: No    Critical Care performed: No ____________________________________________   INITIAL IMPRESSION / ASSESSMENT AND PLAN / ED COURSE  Pertinent labs & imaging results that were available during my care of the patient were reviewed by me and considered in my medical decision making (see chart for details).  Patient presents with likely mild withdrawal symptoms. His exam is unremarkable. Lab work is reassuring. No SI but he is requesting to speak to psychiatry and does report significant depression and anxiety so I will consult [name]    ____________________________________________   FINAL CLINICAL IMPRESSION(S) / ED  DIAGNOSES  Final diagnoses:  Anxiety  Benzodiazepine withdrawal without complication ([place])      NEW MEDICATIONS STARTED DURING THIS VISIT:  New Prescriptions   No medications on file     Note:  This document was prepared using Dragon voice recognition software and may include unintentional dictation errors.    Lavonia Drafts, MD 05/11/16 [phone]

## 2016-05-11 NOTE — ED Notes (Signed)
Pt still awaiting to see psych. Pt given PO Benedryl for anxiety. Sister remains at bedside.

## 2016-05-12 ENCOUNTER — Emergency Department
Admission: EM | Admit: 2016-05-12 | Discharge: 2016-05-12 | Discharge status: Against Medical Advice | Payer: Medicare Other | Attending: Emergency Medicine | Admitting: Emergency Medicine

## 2016-05-12 ENCOUNTER — Encounter: Payer: Self-pay | Admitting: Emergency Medicine

## 2016-05-12 DIAGNOSIS — N189 Chronic kidney disease, unspecified: Secondary | ICD-10-CM | POA: Diagnosis not present

## 2016-05-12 DIAGNOSIS — Y900 Blood alcohol level of less than 20 mg/100 ml: Secondary | ICD-10-CM | POA: Diagnosis not present

## 2016-05-12 DIAGNOSIS — F419 Anxiety disorder, unspecified: Secondary | ICD-10-CM | POA: Diagnosis not present

## 2016-05-12 DIAGNOSIS — Z7982 Long term (current) use of aspirin: Secondary | ICD-10-CM | POA: Insufficient documentation

## 2016-05-12 DIAGNOSIS — F1010 Alcohol abuse, uncomplicated: Secondary | ICD-10-CM | POA: Diagnosis not present

## 2016-05-12 DIAGNOSIS — I129 Hypertensive chronic kidney disease with stage 1 through stage 4 chronic kidney disease, or unspecified chronic kidney disease: Secondary | ICD-10-CM | POA: Diagnosis not present

## 2016-05-12 DIAGNOSIS — Z79899 Other long term (current) drug therapy: Secondary | ICD-10-CM | POA: Insufficient documentation

## 2016-05-12 DIAGNOSIS — F17210 Nicotine dependence, cigarettes, uncomplicated: Secondary | ICD-10-CM | POA: Diagnosis not present

## 2016-05-12 DIAGNOSIS — E1122 Type 2 diabetes mellitus with diabetic chronic kidney disease: Secondary | ICD-10-CM | POA: Insufficient documentation

## 2016-05-12 DIAGNOSIS — F17290 Nicotine dependence, other tobacco product, uncomplicated: Secondary | ICD-10-CM | POA: Insufficient documentation

## 2016-05-12 DIAGNOSIS — F1320 Sedative, hypnotic or anxiolytic dependence, uncomplicated: Secondary | ICD-10-CM

## 2016-05-12 DIAGNOSIS — I2510 Atherosclerotic heart disease of native coronary artery without angina pectoris: Secondary | ICD-10-CM | POA: Insufficient documentation

## 2016-05-12 LAB — CBC WITH DIFFERENTIAL/PLATELET
Basophils Absolute: 0 10*3/uL (ref 0–0.1)
Basophils Relative: 1 %
EOS ABS: 0 10*3/uL (ref 0–0.7)
Eosinophils Relative: 0 %
HCT: 44.2 % (ref 40.0–52.0)
Hemoglobin: 14.7 g/dL (ref 13.0–18.0)
LYMPHS ABS: 1.2 10*3/uL (ref 1.0–3.6)
Lymphocytes Relative: 13 %
MCH: 30.3 pg (ref 26.0–34.0)
MCHC: 33.3 g/dL (ref 32.0–36.0)
MCV: 91 fL (ref 80.0–100.0)
MONO ABS: 0.7 10*3/uL (ref 0.2–1.0)
MONOS PCT: 7 %
Neutro Abs: 7.5 10*3/uL — ABNORMAL HIGH (ref 1.4–6.5)
Neutrophils Relative %: 79 %
PLATELETS: 230 10*3/uL (ref 150–440)
RBC: 4.86 MIL/uL (ref 4.40–5.90)
RDW: 14.5 % (ref 11.5–14.5)
WBC: 9.5 10*3/uL (ref 3.8–10.6)

## 2016-05-12 LAB — URINE DRUG SCREEN, QUALITATIVE (ARMC ONLY)
AMPHETAMINES, UR SCREEN: NOT DETECTED
BARBITURATES, UR SCREEN: NOT DETECTED
Benzodiazepine, Ur Scrn: NOT DETECTED
COCAINE METABOLITE, UR ~~LOC~~: NOT DETECTED
Cannabinoid 50 Ng, Ur ~~LOC~~: NOT DETECTED
MDMA (Ecstasy)Ur Screen: NOT DETECTED
METHADONE SCREEN, URINE: NOT DETECTED
Opiate, Ur Screen: NOT DETECTED
Phencyclidine (PCP) Ur S: NOT DETECTED
TRICYCLIC, UR SCREEN: NOT DETECTED

## 2016-05-12 LAB — COMPREHENSIVE METABOLIC PANEL
ALK PHOS: 105 U/L (ref 38–126)
ALT: 22 U/L (ref 17–63)
ANION GAP: 10 (ref 5–15)
AST: 28 U/L (ref 15–41)
Albumin: 4.2 g/dL (ref 3.5–5.0)
BUN: 9 mg/dL (ref 6–20)
CALCIUM: 9.1 mg/dL (ref 8.9–10.3)
CHLORIDE: 104 mmol/L (ref 101–111)
CO2: 23 mmol/L (ref 22–32)
Creatinine, Ser: 0.86 mg/dL (ref 0.61–1.24)
GFR calc non Af Amer: >60 mL/min (ref >60)
Glucose, Bld: 119 mg/dL — ABNORMAL HIGH (ref 65–99)
POTASSIUM: 4 mmol/L (ref 3.5–5.1)
SODIUM: 137 mmol/L (ref 135–145)
Total Bilirubin: 0.7 mg/dL (ref 0.3–1.2)
Total Protein: 7.4 g/dL (ref 6.5–8.1)

## 2016-05-12 LAB — TROPONIN I

## 2016-05-12 LAB — ETHANOL: Alcohol, Ethyl (B): <5 mg/dL (ref <5)

## 2016-05-12 LAB — LIPASE, BLOOD: Lipase: 43 U/L (ref 11–51)

## 2016-05-12 MED ORDER — FOLIC ACID 1 MG PO TABS: 1.0000 mg | ORAL_TABLET | Freq: Every day | ORAL | Status: DC

## 2016-05-12 MED ORDER — THIAMINE HCL 100 MG/ML IJ SOLN: 100.0000 mg | Freq: Every day | INTRAMUSCULAR | Status: DC

## 2016-05-12 MED ORDER — LORAZEPAM 2 MG PO TABS: 0.0000 mg | ORAL_TABLET | Freq: Two times a day (BID) | ORAL | Status: DC

## 2016-05-12 MED ORDER — ADULT MULTIVITAMIN W/MINERALS CH: 1.0000 | ORAL_TABLET | Freq: Every day | ORAL | Status: DC

## 2016-05-12 MED ORDER — LORAZEPAM 2 MG PO TABS
0.0000 mg | ORAL_TABLET | Freq: Four times a day (QID) | ORAL | Status: DC
Start: 2016-05-12 — End: 2016-05-12

## 2016-05-12 MED ORDER — VITAMIN B-1 100 MG PO TABS: 100.0000 mg | ORAL_TABLET | Freq: Every day | ORAL | Status: DC

## 2016-05-12 MED ORDER — LORAZEPAM 1 MG PO TABS
1.0000 mg | ORAL_TABLET | Freq: Once | ORAL | Status: AC
  Administered 2016-05-12: 1 mg via ORAL
  Filled 2016-05-12: qty 1

## 2016-05-12 MED ORDER — LORAZEPAM 1 MG PO TABS: 1.0000 mg | ORAL_TABLET | Freq: Four times a day (QID) | ORAL | Status: DC | PRN

## 2016-05-12 MED ORDER — LORAZEPAM 2 MG/ML IJ SOLN: 1.0000 mg | Freq: Four times a day (QID) | INTRAMUSCULAR | Status: DC | PRN

## 2016-05-12 NOTE — ED Triage Notes (Signed)
Pt ems from home for anxiety, palpitations. Pt seen here yesterday for same. Per ems pts md refused to refill Klonopin prescription due to etoh. pts last dose was Friday.

## 2016-05-12 NOTE — BH Assessment (Signed)
Assessment Note  Michael Schmitt is an 46 y.o. male who presents to the ER due to having concerns increase anxiety. Per his report, he's doctor stop prescribing his Klonopin due to his ongoing use of alcohol. Patient denies SI/HI and AV/H. He reports of being inpatient for his alcohol use approximately 18 years ago. He was last inpatient with [facility] 08/2013 for depression.  He currently receives mental health outpatient treatment with [facility] He is under the psychiatric care of Dr. Myer Haff. He has an appointment with him next week. Patient states, he was hoping to get "some Klonopin to hold me over till I see my doctor next week. I have a letter from the detox place to prove I stop drinking..."   Patient currently have two upcoming court dates, 05/15/2016 and 05/19/2016. According to the [employer], he was charged with; [place], Traffic DRIVING WHILE IMPAIRED, Traffic CIVIL REVOCATION DR LIC & Traffic OPEN CONT AFTER CONS ALC 1ST.   Past Medical History:  Past Medical History:  Diagnosis Date  . Anxiety   . Anxiety   . Arthritis    cerv. stenosis, spondylosis, HNP- lower back , has been followed in pain clinic, has  had injection s in cerv. area  . Blood dyscrasia    told that when he was young he was a" free bleeder"  . CAD (coronary artery disease)   . Cervical spondylosis without myelopathy 07/24/2014  . Cervicogenic headache 07/24/2014  . Chronic kidney disease    renal calculi- passed spontaneously  . Depression   . Diabetes mellitus without complication ([place])   . Fatty liver   . GERD (gastroesophageal reflux disease)   . Headache(784.0)   . Hyperlipidemia   . Hypertension   . Mental disorder   . MI, old   . RLS (restless legs syndrome)    detected on sleep study  . Shortness of breath   . Ventricular fibrillation ([place]) 04/05/2016    Past Surgical History:  Procedure Laterality Date  . ANTERIOR CERVICAL  DECOMP/DISCECTOMY FUSION  11/13/2011   Procedure: ANTERIOR CERVICAL DECOMPRESSION/DISCECTOMY FUSION 1 LEVEL;  Surgeon: [name], MD;  Location: [facility];  Service: Neurosurgery;  Laterality: N/A;  Anterior Cervical Decompression/discectomy Fusion. Cervical three-four.  Marland Kitchen CARDIAC CATHETERIZATION N/A 01/01/2015   Procedure: Left Heart Cath and Coronary Angiography;  Surgeon: [name], MD;  Location: [place] [facility];  Service: Cardiovascular;  Laterality: N/A;  . CARDIAC CATHETERIZATION N/A 04/05/2016   Procedure: Left Heart Cath and Coronary Angiography;  Surgeon: Belva Crome, MD;  Location: [place] [facility];  Service: Cardiovascular;  Laterality: N/A;  . CARDIAC CATHETERIZATION N/A 04/05/2016   Procedure: Coronary Stent Intervention;  Surgeon: Belva Crome, MD;  Location: [place] [facility];  Service: Cardiovascular;  Laterality: N/A;  . CARDIAC CATHETERIZATION N/A 04/05/2016   Procedure: Intravascular Ultrasound/IVUS;  Surgeon: Belva Crome, MD;  Location: [place] [facility];  Service: Cardiovascular;  Laterality: N/A;  . NASAL SINUS SURGERY     2005    Family History:  Family History  Problem Relation Age of Onset  . Prostate cancer Father   . Hypertension Mother   . Kidney Stones Mother   . Anxiety disorder Mother   . Depression Mother   . COPD Sister   . Hypertension Sister   . Diabetes Sister   . Depression Sister   . Anxiety disorder Sister   . Seizures Sister  Social History:  reports that he has been smoking Cigarettes and Cigars.  He has a 34.00 pack-year smoking history. He uses smokeless tobacco. He reports that he does not drink alcohol or use drugs.  Additional Social History:  Alcohol / Drug Use Pain Medications: See PTA Prescriptions: See PTA Over the Counter: See PTA  History of alcohol / drug use?: Yes Longest period of sobriety (when/how long): Unable to quantify Substance #1 Name of Substance 1: Alcohol 1 - Age of First Use: 20 1 - Amount  (size/oz): "12 pack" 1 - Frequency: Daily 1 - Duration: Unable to quantify 1 - Last Use / Amount: 05/06/2016  CIWA: CIWA-Ar BP: (!) 157/96 Pulse Rate: (!) 107 Nausea and Vomiting: no nausea and no vomiting Tactile Disturbances: none Tremor: moderate, with patient's arms extended Auditory Disturbances: not present Paroxysmal Sweats: barely perceptible sweating, palms moist Visual Disturbances: not present Anxiety: three Headache, Fullness in Head: none present Agitation: normal activity Orientation and Clouding of Sensorium: oriented and can do serial additions CIWA-Ar Total: 8 COWS:    Allergies:  Allergies  Allergen Reactions  . Prednisone Other (See Comments)    Hypertension, makes him feel spacey  . Hydrocodone Other (See Comments)    Headache, irritable  . Varenicline Other (See Comments)    Suicidal thoughts    Home Medications:  (Not in a hospital admission)  OB/GYN Status:  No LMP for male patient.  General Assessment Data Location of Assessment: [facility] ED TTS Assessment: In system Is this a Tele or Face-to-Face Assessment?: Face-to-Face Is this an Initial Assessment or a Re-assessment for this encounter?: Initial Assessment Marital status: Single Maiden name: n/a Is patient pregnant?: No Living Arrangements: Alone Can pt return to current living arrangement?: Yes Admission Status: Voluntary Is patient capable of signing voluntary admission?: Yes Referral Source: Self/Family/Friend Insurance type: [facility] MCR  Medical Screening Exam ([place]) Medical Exam completed: Yes  Crisis Care Plan Living Arrangements: Alone Legal Guardian: Other: (Self) Name of Psychiatrist: Dr. Myer Haff ([place]) Name of Therapist: [place]  Education Status Is patient currently in school?: No Current Grade: n/a Highest grade of school patient has completed: n/a Name of school: n/a Contact person: n/a  Risk to self with the past 6  months Suicidal Ideation: No Has patient been a risk to self within the past 6 months prior to admission? : No Suicidal Intent: No Has patient had any suicidal intent within the past 6 months prior to admission? : No Is patient at risk for suicide?: No Suicidal Plan?: No Has patient had any suicidal plan within the past 6 months prior to admission? : No Access to Means: No What has been your use of drugs/alcohol within the last 12 months?: Alcohol Previous Attempts/Gestures: No How many times?: 0 Other Self Harm Risks: Active Addiction Triggers for Past Attempts: None known Intentional Self Injurious Behavior: None Family Suicide History: No Recent stressful life event(s): Other (Comment), Turmoil (Comment) (Active Addiction) Persecutory voices/beliefs?: No Depression: Yes Depression Symptoms: Guilt Substance abuse history and/or treatment for substance abuse?: Yes Suicide prevention information given to non-admitted patients: Not applicable  Risk to Others within the past 6 months Homicidal Ideation: No Does patient have any lifetime risk of violence toward others beyond the six months prior to admission? : No Thoughts of Harm to Others: No Current Homicidal Intent: No Current Homicidal Plan: No Access to Homicidal Means: No Identified Victim: Reports of none History of harm to others?: No Assessment of  Violence: None Noted Violent Behavior Description: Reports of none Does patient have access to weapons?: No Criminal Charges Pending?: Yes Describe Pending Criminal Charges: Two DWI's Does patient have a court date: Yes Court Date: 05/15/16 (05/19/2016) Is patient on probation?: No  Psychosis Hallucinations: None noted Delusions: None noted  Mental Status Report Appearance/Hygiene: Unremarkable, In scrubs Eye Contact: Good Motor Activity: Unremarkable Speech: Logical/coherent Level of Consciousness: Alert Mood: Anxious, Depressed, Sad, Pleasant Affect: Appropriate  to circumstance, Sad Anxiety Level: None Thought Processes: Coherent, Relevant Judgement: Unimpaired Orientation: Person, Place, Time, Situation, Appropriate for developmental age Obsessive Compulsive Thoughts/Behaviors: None  Cognitive Functioning Concentration: Normal Memory: Recent Intact, Remote Intact IQ: Average Insight: Fair Impulse Control: Fair Appetite: Good Weight Loss: 0 Weight Gain: 0 Sleep: Decreased (Trouble falling and staying asleep) Total Hours of Sleep: 2 Vegetative Symptoms: None  ADLScreening [facility] Assessment Services) Patient's cognitive ability adequate to safely complete daily activities?: Yes Patient able to express need for assistance with ADLs?: Yes Independently performs ADLs?: Yes (appropriate for developmental age)  Prior Inpatient Therapy Prior Inpatient Therapy: Yes Prior Therapy Dates: "It was over 18 years ago." Prior Therapy Facilty/Provider(s): [facility] Reason for Treatment: Alcohol Use Disorder  Prior Outpatient Therapy Prior Outpatient Therapy: Yes Prior Therapy Dates: Current Prior Therapy Facilty/Provider(s): [facility] Reason for Treatment: Anxiety and Depression Does patient have an ACCT team?: No Does patient have Intensive In-House Services?  : No Does patient have [facility] services? : No Does patient have P4CC services?: No  ADL Screening (condition at time of admission) Patient's cognitive ability adequate to safely complete daily activities?: Yes Is the patient deaf or have difficulty hearing?: No Does the patient have difficulty seeing, even when wearing glasses/contacts?: No Does the patient have difficulty concentrating, remembering, or making decisions?: No Patient able to express need for assistance with ADLs?: Yes Does the patient have difficulty dressing or bathing?: No Independently performs ADLs?: Yes (appropriate for developmental age) Does the patient have difficulty walking or  climbing stairs?: No Weakness of Legs: None Weakness of Arms/Hands: None  Home Assistive Devices/Equipment Home Assistive Devices/Equipment: None  Therapy Consults (therapy consults require a physician order) PT Evaluation Needed: No OT Evalulation Needed: No SLP Evaluation Needed: No Abuse/Neglect Assessment (Assessment to be complete while patient is alone) Physical Abuse: Denies Verbal Abuse: Denies Sexual Abuse: Denies Exploitation of patient/patient's resources: Denies Self-Neglect: Denies Values / Beliefs Cultural Requests During Hospitalization: None Spiritual Requests During Hospitalization: None Consults Spiritual Care Consult Needed: No Social Work Consult Needed: No Regulatory affairs officer (For Healthcare) Does Patient Have a Medical Advance Directive?: No    Additional Information 1:1 In Past 12 Months?: No CIRT Risk: No Elopement Risk: No Does patient have medical clearance?: Yes  Child/Adolescent Assessment Running Away Risk: Denies (Patient is an adult)  Disposition:  Disposition Initial Assessment Completed for this Encounter: Yes Disposition of Patient: Other dispositions (ER MD Ordered Psych Consult)  On Site Evaluation by:   Reviewed with Physician:    Gunnar Fusi MS, LCAS, LPC, [place], CCSI Therapeutic Triage Specialist 05/12/2016 11:29 AM

## 2016-05-12 NOTE — Consult Note (Signed)
[facility] Consult   Reason for Consult:  Consult for 46 year old man with a history of alcohol and substance abuse who came to the emergency room complaining of anxiety Referring Physician:  Darl Householder Patient Identification: Michael Schmitt MRN:  [id number] Principal Diagnosis: Alcohol abuse Diagnosis:   Patient Active Problem List   Diagnosis Date Noted  . Alcohol abuse [F10.10] 05/12/2016  . Substance induced mood disorder ([place]) [F19.94] 05/12/2016  . Benzodiazepine dependence ([place]) [F13.20] 05/12/2016  . Ventricular fibrillation ([place]) [I49.01] 04/05/2016  . 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 Generalized anxiety disorder [F41.1] 08/11/2015  . Major depressive disorder, recurrent episode, moderate with anxious distress ([employer]) [F33.1] 06/21/2015  . NSTEMI (non-ST elevated myocardial infarction) ([place]) [I21.4] 06/20/2015  . Acute ST elevation myocardial infarction (STEMI) involving left anterior descending (LAD) coronary artery ([place]) [I21.02] 01/01/2015  . Acute MI, inferoposterior wall ([place]) [I21.19] 01/01/2015  . Common bile duct dilation [K83.8] 10/06/2014  . Right lower quadrant abdominal pain [R10.31] 10/06/2014  . Loss of weight [R63.4] 10/06/2014  . Cervicogenic headache [R51] 07/24/2014  . Cervical spondylosis without myelopathy [M47.812] 07/24/2014  . Clinical depression [F32.9] 04/15/2013  . Acid reflux [K21.9] 04/15/2013  . BP (high blood pressure) [I10] 04/15/2013  . Encounter for other preprocedural examination [W09.811] 04/15/2013  . Nasal obstruction [J34.89] 02/12/2013  . Inflamed nasal mucosa [J31.0] 02/12/2013  . Sinus infection [J32.9] 02/12/2013  . Gastric catarrh [K29.70] 03/01/2011  . Allergic rhinitis [J30.9] 10/20/2010  . Airway hyperreactivity [J45.909] 10/20/2010  . Anxiety, generalized [F41.1] 10/20/2010  . Cervical pain [M54.2] 10/20/2010  . Current tobacco use [Z72.0] 10/20/2010    Total Time spent with patient: 1  hour  Subjective:   Michael Schmitt is a 46 y.o. male patient admitted with "a took me off those Klonopin and I just can't take it".  HPI:  Patient interviewed. Chart reviewed. Patient known from multiple prior encounters. 46 year old man presented to the emergency room complaining of anxiety. He says that his psychiatrist abruptly took him off of his Klonopin last Friday. In fact what it sounds like And is that the patient was asking for a refill and his doctor told him that he was unwilling to do it given that the patient was still drinking regularly without coming in for an appointment. The patient was in a detox program at [facility] for several days and just left on Wednesday. During that time he was not given any clonazepam or other benzodiazepines. As soon as he got out it sounds that he started back to drinking. He says he's had about 3 beers each of the last 2 nights which by his standard is a trivial amount of alcohol. His chief complaint now is that he is so anxious that he just can't stand how he feels. Ringing any psychotic symptoms. Not suicidal or homicidal. Physically he says that he's had some tachycardia although currently his vital signs are stable.  Social history: Patient lives with his mother and children and sister. He is on disability. Not currently working.  Medical history: He has not had full DTs or alcohol withdrawal seizures. Long-standing alcohol abuse. Reasonably good physical condition otherwise.  Substance abuse history: Long-standing abuse of alcohol and other benzodiazepines. Has an outpatient psychiatrist who has facilitated this by maintaining him on benzodiazepines for years. Patient's insight is poor in terms of being able to understand the connection between his substance abuse and his anxiety symptoms.  Past Psychiatric History: He has long complained of depression and anxiety.  Currently not on any psychiatric medicines. He had asked about restarting on  Prozac which she had taken previously although it's not clear how beneficial any of that has been. No history of suicide attempts or violence or psychosis.  Risk to Self: Suicidal Ideation: No Suicidal Intent: No Is patient at risk for suicide?: No Suicidal Plan?: No Access to Means: No What has been your use of drugs/alcohol within the last 12 months?: Alcohol How many times?: 0 Other Self Harm Risks: Active Addiction Triggers for Past Attempts: None known Intentional Self Injurious Behavior: None Risk to Others: Homicidal Ideation: No Thoughts of Harm to Others: No Current Homicidal Intent: No Current Homicidal Plan: No Access to Homicidal Means: No Identified Victim: Reports of none History of harm to others?: No Assessment of Violence: None Noted Violent Behavior Description: Reports of none Does patient have access to weapons?: No Criminal Charges Pending?: Yes Describe Pending Criminal Charges: Two DWI's Does patient have a court date: Yes Court Date: 05/15/16 (05/19/2016) Prior Inpatient Therapy: Prior Inpatient Therapy: Yes Prior Therapy Dates: "It was over 18 years ago." Prior Therapy Facilty/Provider(s): [facility] Reason for Treatment: Alcohol Use Disorder Prior Outpatient Therapy: Prior Outpatient Therapy: Yes Prior Therapy Dates: Current Prior Therapy Facilty/Provider(s): [facility] Reason for Treatment: Anxiety and Depression Does patient have an ACCT team?: No Does patient have Intensive In-House Services?  : No Does patient have [facility] services? : No Does patient have P4CC services?: No  Past Medical History:  Past Medical History:  Diagnosis Date  . Anxiety   . Anxiety   . Arthritis    cerv. stenosis, spondylosis, HNP- lower back , has been followed in pain clinic, has  had injection s in cerv. area  . Blood dyscrasia    told that when he was young he was a" free bleeder"  . CAD (coronary artery disease)   . Cervical  spondylosis without myelopathy 07/24/2014  . Cervicogenic headache 07/24/2014  . Chronic kidney disease    renal calculi- passed spontaneously  . Depression   . Diabetes mellitus without complication ([place])   . Fatty liver   . GERD (gastroesophageal reflux disease)   . Headache(784.0)   . Hyperlipidemia   . Hypertension   . Mental disorder   . MI, old   . RLS (restless legs syndrome)    detected on sleep study  . Shortness of breath   . Ventricular fibrillation ([place]) 04/05/2016    Past Surgical History:  Procedure Laterality Date  . ANTERIOR CERVICAL DECOMP/DISCECTOMY FUSION  11/13/2011   Procedure: ANTERIOR CERVICAL DECOMPRESSION/DISCECTOMY FUSION 1 LEVEL;  Surgeon: [name], MD;  Location: [facility];  Service: Neurosurgery;  Laterality: N/A;  Anterior Cervical Decompression/discectomy Fusion. Cervical three-four.  Marland Kitchen CARDIAC CATHETERIZATION N/A 01/01/2015   Procedure: Left Heart Cath and Coronary Angiography;  Surgeon: [name], MD;  Location: [facility];  Service: Cardiovascular;  Laterality: N/A;  . CARDIAC CATHETERIZATION N/A 04/05/2016   Procedure: Left Heart Cath and Coronary Angiography;  Surgeon: Belva Crome, MD;  Location: [facility];  Service: Cardiovascular;  Laterality: N/A;  . CARDIAC CATHETERIZATION N/A 04/05/2016   Procedure: Coronary Stent Intervention;  Surgeon: Belva Crome, MD;  Location: [facility];  Service: Cardiovascular;  Laterality: N/A;  . CARDIAC CATHETERIZATION N/A 04/05/2016   Procedure: Intravascular Ultrasound/IVUS;  Surgeon: Belva Crome, MD;  Location: [facility];  Service: Cardiovascular;  Laterality: N/A;  . NASAL  SINUS SURGERY     2005   Family History:  Family History  Problem Relation Age of Onset  . Prostate cancer Father   . Hypertension Mother   . Kidney Stones Mother   . Anxiety disorder Mother   . Depression Mother   . COPD Sister   . Hypertension Sister   . Diabetes Sister   . Depression  Sister   . Anxiety disorder Sister   . Seizures Sister    Family Psychiatric  History: Unknown Social History:  History  Alcohol Use No    Comment: trying not to drink      History  Drug Use No    Social History   Social History  . Marital status: Widowed    Spouse name: N/A  . Number of children: 3  . Years of education: 12   Occupational History  .      unemployed   Social History Main Topics  . Smoking status: Current Every Day Smoker    Packs/day: 2.00    Years: 17.00    Types: Cigarettes, Cigars  . Smokeless tobacco: Current User     Comment: occassional snuff   . Alcohol use No     Comment: trying not to drink   . Drug use: No  . Sexual activity: Not Currently   Other Topics Concern  . None   Social History Narrative   Patient is right handed.   Patient drinks 2 sodas daily.   Additional Social History:    Allergies:   Allergies  Allergen Reactions  . Prednisone Other (See Comments)    Hypertension, makes him feel spacey  . Hydrocodone Other (See Comments)    Headache, irritable  . Varenicline Other (See Comments)    Suicidal thoughts    Labs:  Results for orders placed or performed during the hospital encounter of 05/12/16 (from the past 48 hour(s))  CBC with Differential/Platelet     Status: Abnormal   Collection Time: 05/12/16  7:38 AM  Result Value Ref Range   WBC 9.5 3.8 - 10.6 K/uL   RBC 4.86 4.40 - 5.90 MIL/uL   Hemoglobin 14.7 13.0 - 18.0 g/dL   HCT 44.2 40.0 - 52.0 %   MCV 91.0 80.0 - 100.0 fL   MCH 30.3 26.0 - 34.0 pg   MCHC 33.3 32.0 - 36.0 g/dL   RDW 14.5 11.5 - 14.5 %   Platelets 230 150 - 440 K/uL   Neutrophils Relative % 79 %   Neutro Abs 7.5 (H) 1.4 - 6.5 K/uL   Lymphocytes Relative 13 %   Lymphs Abs 1.2 1.0 - 3.6 K/uL   Monocytes Relative 7 %   Monocytes Absolute 0.7 0.2 - 1.0 K/uL   Eosinophils Relative 0 %   Eosinophils Absolute 0.0 0 - 0.7 K/uL   Basophils Relative 1 %   Basophils Absolute 0.0 0 - 0.1 K/uL   Comprehensive metabolic panel     Status: Abnormal   Collection Time: 05/12/16  7:38 AM  Result Value Ref Range   Sodium 137 135 - 145 mmol/L   Potassium 4.0 3.5 - 5.1 mmol/L   Chloride 104 101 - 111 mmol/L   CO2 23 22 - 32 mmol/L   Glucose, Bld 119 (H) 65 - 99 mg/dL   BUN 9 6 - 20 mg/dL   Creatinine, Ser 0.86 0.61 - 1.24 mg/dL   Calcium 9.1 8.9 - 10.3 mg/dL   Total Protein 7.4 6.5 - 8.1 g/dL  Albumin 4.2 3.5 - 5.0 g/dL   AST 28 15 - 41 U/L   ALT 22 17 - 63 U/L   Alkaline Phosphatase 105 38 - 126 U/L   Total Bilirubin 0.7 0.3 - 1.2 mg/dL   GFR calc non Af Amer >60 >60 mL/min   GFR calc Af Amer >60 >60 mL/min    Comment: (NOTE) The eGFR has been calculated using the CKD EPI equation. This calculation has not been validated in all clinical situations. eGFR's persistently <60 mL/min signify possible Chronic Kidney Disease.    Anion gap 10 5 - 15  Ethanol     Status: None   Collection Time: 05/12/16  7:38 AM  Result Value Ref Range   Alcohol, Ethyl (B) <5 <5 mg/dL    Comment:        LOWEST DETECTABLE LIMIT FOR SERUM ALCOHOL IS 5 mg/dL FOR MEDICAL PURPOSES ONLY   Urine Drug Screen, Qualitative ([facility] only)     Status: None   Collection Time: 05/12/16  7:38 AM  Result Value Ref Range   Tricyclic, Ur Screen NONE DETECTED NONE DETECTED   Amphetamines, Ur Screen NONE DETECTED NONE DETECTED   MDMA (Ecstasy)Ur Screen NONE DETECTED NONE DETECTED   Cocaine Metabolite,Ur [place] NONE DETECTED NONE DETECTED   Opiate, Ur Screen NONE DETECTED NONE DETECTED   Phencyclidine (PCP) Ur S NONE DETECTED NONE DETECTED   Cannabinoid 50 Ng, Ur [place] NONE DETECTED NONE DETECTED   Barbiturates, Ur Screen NONE DETECTED NONE DETECTED   Benzodiazepine, Ur Scrn NONE DETECTED NONE DETECTED   Methadone Scn, Ur NONE DETECTED NONE DETECTED    Comment: (NOTE) 253  Tricyclics, urine               Cutoff 1000 ng/mL 200  Amphetamines, urine             Cutoff 1000 ng/mL 300  MDMA (Ecstasy), urine            Cutoff 500 ng/mL 400  Cocaine Metabolite, urine       Cutoff 300 ng/mL 500  Opiate, urine                   Cutoff 300 ng/mL 600  Phencyclidine (PCP), urine      Cutoff 25 ng/mL 700  Cannabinoid, urine              Cutoff 50 ng/mL 800  Barbiturates, urine             Cutoff 200 ng/mL 900  Benzodiazepine, urine           Cutoff 200 ng/mL 1000 Methadone, urine                Cutoff 300 ng/mL 1100 1200 The urine drug screen provides only a preliminary, unconfirmed 1300 analytical test result and should not be used for non-medical 1400 purposes. Clinical consideration and professional judgment should 1500 be applied to any positive drug screen result due to possible 1600 interfering substances. A more specific alternate chemical method 1700 must be used in order to obtain a confirmed analytical result.  1800 Gas chromato graphy / mass spectrometry (GC/MS) is the preferred 1900 confirmatory method.   Lipase, blood     Status: None   Collection Time: 05/12/16  7:38 AM  Result Value Ref Range   Lipase 43 11 - 51 U/L  Troponin I     Status: None   Collection Time: 05/12/16  7:38 AM  Result Value Ref Range  Troponin I <0.03 <0.03 ng/mL    Current Facility-Administered Medications  Medication Dose Route Frequency Provider Last Rate Last Dose  . folic acid (FOLVITE) tablet 1 mg  1 mg Oral Daily Drenda Freeze, MD      . LORazepam (ATIVAN) tablet 1 mg  1 mg Oral Q6H PRN Drenda Freeze, MD       Or  . LORazepam (ATIVAN) injection 1 mg  1 mg Intravenous Q6H PRN Drenda Freeze, MD      . LORazepam (ATIVAN) tablet 0-4 mg  0-4 mg Oral Q6H Drenda Freeze, MD       Followed by  . [START ON 05/14/2016] LORazepam (ATIVAN) tablet 0-4 mg  0-4 mg Oral Q12H Drenda Freeze, MD      . multivitamin with minerals tablet 1 tablet  1 tablet Oral Daily Drenda Freeze, MD      . thiamine (VITAMIN B-1) tablet 100 mg  100 mg Oral Daily Drenda Freeze, MD       Or  . thiamine (B-1)  injection 100 mg  100 mg Intravenous Daily Drenda Freeze, MD       Current Outpatient Prescriptions  Medication Sig Dispense Refill  . atorvastatin (LIPITOR) 80 MG tablet Take 1 tablet (80 mg total) by mouth daily at 6 PM. 30 tablet 5  . carvedilol (COREG) 6.25 MG tablet Take 1 tablet (6.25 mg total) by mouth 2 (two) times daily with a meal. 60 tablet 5  . dexlansoprazole (DEXILANT) 60 MG capsule Take 60 mg by mouth daily.    Marland Kitchen FLUoxetine (PROZAC) 20 MG capsule Take 1 capsule (20 mg total) by mouth at bedtime. 7 capsule 0  . lisinopril (PRINIVIL,ZESTRIL) 5 MG tablet Take 1 tablet (5 mg total) by mouth daily. 30 tablet 0  . metoprolol tartrate (LOPRESSOR) 25 MG tablet Take 25 mg by mouth 2 (two) times daily.    . mirtazapine (REMERON) 30 MG tablet Take 30 mg by mouth at bedtime.  4  . nitroGLYCERIN (NITROSTAT) 0.4 MG SL tablet Place 1 tablet (0.4 mg total) under the tongue every 5 (five) minutes x 3 doses as needed for chest pain. 25 tablet 2  . ticagrelor (BRILINTA) 90 MG TABS tablet Take 1 tablet (90 mg total) by mouth 2 (two) times daily. 60 tablet 10  . aspirin EC 81 MG EC tablet Take 1 tablet (81 mg total) by mouth daily. (Patient not taking: Reported on 04/12/2016)    . clonazePAM (KLONOPIN) 1 MG tablet Take 1 tablet (1 mg total) by mouth 3 (three) times daily as needed for anxiety. (Patient not taking: Reported on 05/12/2016) 21 tablet 0  . nicotine (NICODERM CQ - DOSED IN MG/24 HOURS) 21 mg/24hr patch Place 1 patch (21 mg total) onto the skin daily. (Patient not taking: Reported on 04/12/2016) 28 patch 0  . OLANZapine (ZYPREXA) 5 MG tablet Take 1 tablet (5 mg total) by mouth at bedtime. (Patient not taking: Reported on 05/11/2016) 7 tablet 0    Musculoskeletal: Strength & Muscle Tone: within normal limits Gait & Station: normal Patient leans: N/A  Psychiatric Specialty Exam: Physical Exam  Nursing note and vitals reviewed. Constitutional: He appears well-developed and  well-nourished.  HENT:  Head: Normocephalic and atraumatic.  Eyes: Conjunctivae are normal. Pupils are equal, round, and reactive to light.  Neck: Normal range of motion.  Cardiovascular: Regular rhythm and normal heart sounds.   Respiratory: Effort normal. No respiratory distress.  GI: Soft.  Musculoskeletal:  Normal range of motion.  Neurological: He is alert.  Skin: Skin is warm and dry.  Psychiatric: His speech is normal and behavior is normal. Judgment and thought content normal. His mood appears anxious. Cognition and memory are normal.    Review of Systems  Constitutional: Negative.   HENT: Negative.   Eyes: Negative.   Respiratory: Negative.   Cardiovascular: Negative.   Gastrointestinal: Negative.   Musculoskeletal: Negative.   Skin: Negative.   Neurological: Positive for dizziness and tingling.  Psychiatric/Behavioral: Positive for depression, memory loss and substance abuse. Negative for hallucinations and suicidal ideas. The patient is nervous/anxious and has insomnia.     Blood pressure (!) 149/93, pulse (!) 108, temperature 98.1 F (36.7 C), temperature source Oral, resp. rate 18, height _0  (1.88 m), weight 92.5 kg (204 lb), SpO2 97 %.Body mass index is 26.19 kg/m.  General Appearance: Casual  Eye Contact:  Fair  Speech:  Slow  Volume:  Decreased  Mood:  Dysphoric  Affect:  Congruent  Thought Process:  Goal Directed  Orientation:  Full (Time, Place, and Person)  Thought Content:  Tangential  Suicidal Thoughts:  No  Homicidal Thoughts:  No  Memory:  Immediate;   Fair Recent;   Fair Remote;   Fair  Judgement:  Fair  Insight:  Fair  Psychomotor Activity:  Decreased  Concentration:  Concentration: Fair  Recall:  [employer] of Knowledge:  Fair  Language:  Fair  Akathisia:  No  Handed:  Right  AIMS (if indicated):     Assets:  Communication Skills Desire for Improvement Housing Physical Health Resilience  ADL's:  Intact  Cognition:  WNL  Sleep:         Treatment Plan Summary: Plan 46 year old man with alcohol abuse and benzodiazepine abuse. No suicidal ideation no psychosis medically stable. Patient is essentially here in the emergency room requesting a prescription for clonazepam. I explained to him that it would be inappropriate to restart this first of all because I think he has a clear alcohol and drug abuse history and second of all because we are not going to start any controlled substances from the emergency room. There is no medical indication for right now. Patient encouraged to try to stick to his plan to stop drinking. He is going to follow-up with his outpatient psychiatrist, Dr. Kasandra Knudsen next week.  Disposition: Patient does not meet criteria for psychiatric inpatient admission.  Alethia Berthold, MD 05/12/2016 2:26 PM

## 2016-05-12 NOTE — ED Provider Notes (Signed)
[name] Provider Note   CSN: [id number] Arrival date & time: 05/12/16  0719     History   Chief Complaint Chief Complaint  Patient presents with  . Anxiety    HPI Michael Schmitt is a 46 y.o. male hx of anxiety, CAD, DM, HL, HTN, here with Depression, alcohol abuse, anxiety. Patient has been drinking alcohol off and on for many years. Detox before. Patient has been anxious and was taken off of Klonopin a week ago because he has been drinking alcohol. Last alcohol was last night. It actually came to the ER yesterday for anxiety and depression but left prior to the psychiatrist. Patient states that last night he got more anxious so tried to drink some alcohol again. This morning, he admits to be anxious and very depressed. Denies any suicidal ideation or plan.   The history is provided by the patient.    Past Medical History:  Diagnosis Date  . Anxiety   . Anxiety   . Arthritis    cerv. stenosis, spondylosis, HNP- lower back , has been followed in pain clinic, has  had injection s in cerv. area  . Blood dyscrasia    told that when he was young he was a" free bleeder"  . CAD (coronary artery disease)   . Cervical spondylosis without myelopathy 07/24/2014  . Cervicogenic headache 07/24/2014  . Chronic kidney disease    renal calculi- passed spontaneously  . Depression   . Diabetes mellitus without complication ([place])   . Fatty liver   . GERD (gastroesophageal reflux disease)   . Headache(784.0)   . Hyperlipidemia   . Hypertension   . Mental disorder   . MI, old   . RLS (restless legs syndrome)    detected on sleep study  . Shortness of breath   . Ventricular fibrillation ([place]) 04/05/2016    Patient Active Problem List   Diagnosis Date Noted  . Ventricular fibrillation ([place]) 04/05/2016  . Coronary stent thrombosis   . Generalized anxiety disorder 08/11/2015  . Major depressive disorder, recurrent episode, moderate with anxious distress ([place])  06/21/2015  . NSTEMI (non-ST elevated myocardial infarction) ([place]) 06/20/2015  . Acute ST elevation myocardial infarction (STEMI) involving left anterior descending (LAD) coronary artery ([place]) 01/01/2015  . Acute MI, inferoposterior wall ([place]) 01/01/2015  . Common bile duct dilation 10/06/2014  . Right lower quadrant abdominal pain 10/06/2014  . Loss of weight 10/06/2014  . Cervicogenic headache 07/24/2014  . Cervical spondylosis without myelopathy 07/24/2014  . Clinical depression 04/15/2013  . Acid reflux 04/15/2013  . BP (high blood pressure) 04/15/2013  . Encounter for other preprocedural examination 04/15/2013  . Nasal obstruction 02/12/2013  . Inflamed nasal mucosa 02/12/2013  . Sinus infection 02/12/2013  . Gastric catarrh 03/01/2011  . Allergic rhinitis 10/20/2010  . Airway hyperreactivity 10/20/2010  . Anxiety, generalized 10/20/2010  . Cervical pain 10/20/2010  . Current tobacco use 10/20/2010    Past Surgical History:  Procedure Laterality Date  . ANTERIOR CERVICAL DECOMP/DISCECTOMY FUSION  11/13/2011   Procedure: ANTERIOR CERVICAL DECOMPRESSION/DISCECTOMY FUSION 1 LEVEL;  Surgeon: [name], MD;  Location: [facility];  Service: Neurosurgery;  Laterality: N/A;  Anterior Cervical Decompression/discectomy Fusion. Cervical three-four.  Marland Kitchen CARDIAC CATHETERIZATION N/A 01/01/2015   Procedure: Left Heart Cath and Coronary Angiography;  Surgeon: [name], MD;  Location: [facility];  Service: Cardiovascular;  Laterality: N/A;  . CARDIAC CATHETERIZATION N/A 04/05/2016   Procedure: Left Heart Cath and Coronary Angiography;  Surgeon:  Belva Crome, MD;  Location: [facility];  Service: Cardiovascular;  Laterality: N/A;  . CARDIAC CATHETERIZATION N/A 04/05/2016   Procedure: Coronary Stent Intervention;  Surgeon: Belva Crome, MD;  Location: [facility];  Service: Cardiovascular;  Laterality: N/A;  . CARDIAC CATHETERIZATION N/A 04/05/2016   Procedure:  Intravascular Ultrasound/IVUS;  Surgeon: Belva Crome, MD;  Location: [facility];  Service: Cardiovascular;  Laterality: N/A;  . NASAL SINUS SURGERY     2005       Home Medications    Prior to Admission medications   Medication Sig Start Date End Date Taking? Authorizing Provider  aspirin EC 81 MG EC tablet Take 1 tablet (81 mg total) by mouth daily. Patient not taking: Reported on 04/12/2016 04/08/16   Brittainy M Rosita Fire, PA-C  atorvastatin (LIPITOR) 80 MG tablet Take 1 tablet (80 mg total) by mouth daily at 6 PM. 04/07/16   Brittainy M Rosita Fire, PA-C  carvedilol (COREG) 6.25 MG tablet Take 1 tablet (6.25 mg total) by mouth 2 (two) times daily with a meal. 04/07/16   Brittainy Erie Noe, PA-C  clonazePAM (KLONOPIN) 1 MG tablet Take 1 tablet (1 mg total) by mouth 3 (three) times daily as needed for anxiety. Patient taking differently: Take 2 mg by mouth 3 (three) times daily as needed for anxiety.  03/26/16 04/05/16  Delman Kitten, MD  dexlansoprazole (DEXILANT) 60 MG capsule Take 60 mg by mouth daily.    Historical Provider, MD  FLUoxetine (PROZAC) 20 MG capsule Take 1 capsule (20 mg total) by mouth at bedtime. 03/26/16 05/11/16  Delman Kitten, MD  lisinopril (PRINIVIL,ZESTRIL) 5 MG tablet Take 1 tablet (5 mg total) by mouth daily. 06/21/15   Bettey Costa, MD  metoprolol tartrate (LOPRESSOR) 25 MG tablet Take 25 mg by mouth 2 (two) times daily.    Historical Provider, MD  mirtazapine (REMERON) 30 MG tablet Take 30 mg by mouth at bedtime. 03/07/16   Historical Provider, MD  nicotine (NICODERM CQ - DOSED IN MG/24 HOURS) 21 mg/24hr patch Place 1 patch (21 mg total) onto the skin daily. Patient not taking: Reported on 04/12/2016 04/08/16   Brittainy M Rosita Fire, PA-C  nitroGLYCERIN (NITROSTAT) 0.4 MG SL tablet Place 1 tablet (0.4 mg total) under the tongue every 5 (five) minutes x 3 doses as needed for chest pain. 04/07/16   Brittainy M Simmons, PA-C  OLANZapine (ZYPREXA) 5 MG tablet Take 1 tablet (5 mg  total) by mouth at bedtime. Patient not taking: Reported on 05/11/2016 03/26/16 04/02/16  Delman Kitten, MD  ticagrelor (BRILINTA) 90 MG TABS tablet Take 1 tablet (90 mg total) by mouth 2 (two) times daily. 04/07/16   Brittainy Erie Noe, PA-C    Family History Family History  Problem Relation Age of Onset  . Prostate cancer Father   . Hypertension Mother   . Kidney Stones Mother   . Anxiety disorder Mother   . Depression Mother   . COPD Sister   . Hypertension Sister   . Diabetes Sister   . Depression Sister   . Anxiety disorder Sister   . Seizures Sister     Social History Social History  Substance Use Topics  . Smoking status: Current Every Day Smoker    Packs/day: 2.00    Years: 17.00    Types: Cigarettes, Cigars  . Smokeless tobacco: Current User     Comment: occassional snuff   . Alcohol use No     Comment: trying not to  drink      Allergies   Prednisone; Hydrocodone; and Varenicline   Review of Systems Review of Systems  Psychiatric/Behavioral: Positive for dysphoric mood.  All other systems reviewed and are negative.    Physical Exam Updated Vital Signs BP (!) 157/96 (BP Location: Left Arm)   Pulse (!) 107   Temp 98.1 F (36.7 C) (Oral)   Ht 6\' 2"  (1.88 m)   Wt 204 lb (92.5 kg)   SpO2 97%   BMI 26.19 kg/m   Physical Exam  Constitutional: He is oriented to person, place, and time.  Depressed, anxious   HENT:  Head: Normocephalic.  Mouth/Throat: Oropharynx is clear and moist.  Eyes: EOM are normal. Pupils are equal, round, and reactive to light.  Neck: Normal range of motion. Neck supple.  Cardiovascular: Normal rate, regular rhythm and normal heart sounds.   Pulmonary/Chest: Effort normal and breath sounds normal. No respiratory distress. He has no wheezes. He has no rales.  Abdominal: Soft. Bowel sounds are normal. He exhibits no distension. There is no tenderness. There is no guarding.  Musculoskeletal: Normal range of motion.  Neurological: He  is alert and oriented to person, place, and time. No cranial nerve deficit. Coordination normal.  Skin: Skin is warm.  Psychiatric:  Depressed, anxious   Nursing note and vitals reviewed.    ED Treatments / Results  Labs (all labs ordered are listed, but only abnormal results are displayed) Labs Reviewed  CBC WITH DIFFERENTIAL/PLATELET  COMPREHENSIVE METABOLIC PANEL  ETHANOL  URINE DRUG SCREEN, QUALITATIVE ([place])    EKG  EKG Interpretation None       ED ECG REPORT I, Wandra Arthurs, the attending physician, personally viewed and interpreted this ECG.   Date: 05/12/2016  EKG Time: 7:22 am  Rate: 103  Rhythm: sinus tachycardia  Axis: normal  Intervals:none  ST&T Change: nonspecific   Radiology No results found.  Procedures Procedures (including critical care time)  Medications Ordered in ED Medications  LORazepam (ATIVAN) tablet 1 mg (not administered)  LORazepam (ATIVAN) tablet 1 mg (not administered)    Or  LORazepam (ATIVAN) injection 1 mg (not administered)  thiamine (VITAMIN B-1) tablet 100 mg (not administered)    Or  thiamine (B-1) injection 100 mg (not administered)  folic acid (FOLVITE) tablet 1 mg (not administered)  multivitamin with minerals tablet 1 tablet (not administered)  LORazepam (ATIVAN) tablet 0-4 mg (not administered)    Followed by  LORazepam (ATIVAN) tablet 0-4 mg (not administered)     Initial Impression / Assessment and Plan / ED Course  I have reviewed the triage vital signs and the nursing notes.  Pertinent labs & imaging results that were available during my care of the patient were reviewed by me and considered in my medical decision making (see chart for details).     Michael Schmitt is a 46 y.o. male here with depression, anxiety, alcohol abuse. Slightly tachycardic. Appears anxious and depressed. Last alcohol use yesterday. Will check labs, ETOH, UDS. Will start on CIWA.   1 pm Labs unremarkable. ETOH neg. Given  ativan. TTS saw patient. [name] didn't see patient yet. He wanted to leave. Signed AMA form. Patient not intoxicated, competent to make decisions.   Final Clinical Impressions(s) / ED Diagnoses   Final diagnoses:  None    New Prescriptions New Prescriptions   No medications on file     Drenda Freeze, MD 05/12/16 1413

## 2016-05-12 NOTE — ED Notes (Signed)
Pt oob to desk stating he was going to leave. I asked him did he wanted to wait to be discharged. Pt replied that there was no reason, Dr Weber Cooks was not giving him what he needed, that he had wasted his time for nothing. EDP was not at desk to notify. Pt signed AMA form. Dr. Darl Householder notified after pt left.

## 2016-05-14 ENCOUNTER — Emergency Department
Admission: EM | Admit: 2016-05-14 | Discharge: 2016-05-14 | Disposition: A | Discharge status: Home / Self Care | Payer: Medicare Other | Source: Home / Self Care

## 2016-05-14 ENCOUNTER — Emergency Department: Payer: Medicare Other

## 2016-05-14 ENCOUNTER — Encounter: Payer: Self-pay | Admitting: Emergency Medicine

## 2016-05-14 ENCOUNTER — Emergency Department
Admission: EM | Admit: 2016-05-14 | Discharge: 2016-05-14 | Disposition: A | Discharge status: Home / Self Care | Payer: Medicare Other | Attending: Emergency Medicine | Admitting: Emergency Medicine

## 2016-05-14 DIAGNOSIS — F419 Anxiety disorder, unspecified: Secondary | ICD-10-CM | POA: Insufficient documentation

## 2016-05-14 DIAGNOSIS — Z79899 Other long term (current) drug therapy: Secondary | ICD-10-CM | POA: Insufficient documentation

## 2016-05-14 DIAGNOSIS — I2510 Atherosclerotic heart disease of native coronary artery without angina pectoris: Secondary | ICD-10-CM | POA: Insufficient documentation

## 2016-05-14 DIAGNOSIS — Z7982 Long term (current) use of aspirin: Secondary | ICD-10-CM

## 2016-05-14 DIAGNOSIS — F17290 Nicotine dependence, other tobacco product, uncomplicated: Secondary | ICD-10-CM | POA: Diagnosis not present

## 2016-05-14 DIAGNOSIS — Z5321 Procedure and treatment not carried out due to patient leaving prior to being seen by health care provider: Secondary | ICD-10-CM

## 2016-05-14 DIAGNOSIS — F17210 Nicotine dependence, cigarettes, uncomplicated: Secondary | ICD-10-CM | POA: Diagnosis not present

## 2016-05-14 DIAGNOSIS — I129 Hypertensive chronic kidney disease with stage 1 through stage 4 chronic kidney disease, or unspecified chronic kidney disease: Secondary | ICD-10-CM

## 2016-05-14 DIAGNOSIS — E1122 Type 2 diabetes mellitus with diabetic chronic kidney disease: Secondary | ICD-10-CM | POA: Insufficient documentation

## 2016-05-14 DIAGNOSIS — R079 Chest pain, unspecified: Secondary | ICD-10-CM | POA: Diagnosis not present

## 2016-05-14 DIAGNOSIS — N189 Chronic kidney disease, unspecified: Secondary | ICD-10-CM | POA: Insufficient documentation

## 2016-05-14 LAB — BASIC METABOLIC PANEL
Anion gap: 8 (ref 5–15)
BUN: 15 mg/dL (ref 6–20)
CALCIUM: 9.1 mg/dL (ref 8.9–10.3)
CO2: 25 mmol/L (ref 22–32)
CREATININE: 0.96 mg/dL (ref 0.61–1.24)
Chloride: 104 mmol/L (ref 101–111)
GFR calc non Af Amer: >60 mL/min (ref >60)
Glucose, Bld: 182 mg/dL — ABNORMAL HIGH (ref 65–99)
Potassium: 3.9 mmol/L (ref 3.5–5.1)
SODIUM: 137 mmol/L (ref 135–145)

## 2016-05-14 LAB — URINE DRUG SCREEN, QUALITATIVE (ARMC ONLY)
Amphetamines, Ur Screen: NOT DETECTED
BARBITURATES, UR SCREEN: NOT DETECTED
Benzodiazepine, Ur Scrn: POSITIVE — AB
COCAINE METABOLITE, UR ~~LOC~~: NOT DETECTED
Cannabinoid 50 Ng, Ur ~~LOC~~: NOT DETECTED
MDMA (ECSTASY) UR SCREEN: NOT DETECTED
Methadone Scn, Ur: NOT DETECTED
Opiate, Ur Screen: NOT DETECTED
PHENCYCLIDINE (PCP) UR S: NOT DETECTED
TRICYCLIC, UR SCREEN: NOT DETECTED

## 2016-05-14 LAB — CBC WITH DIFFERENTIAL/PLATELET
BASOS PCT: 1 %
Basophils Absolute: 0.1 10*3/uL (ref 0–0.1)
EOS PCT: 1 %
Eosinophils Absolute: 0.1 10*3/uL (ref 0–0.7)
HCT: 43.3 % (ref 40.0–52.0)
Hemoglobin: 14.5 g/dL (ref 13.0–18.0)
LYMPHS ABS: 1.9 10*3/uL (ref 1.0–3.6)
Lymphocytes Relative: 21 %
MCH: 30.7 pg (ref 26.0–34.0)
MCHC: 33.6 g/dL (ref 32.0–36.0)
MCV: 91.4 fL (ref 80.0–100.0)
MONO ABS: 0.6 10*3/uL (ref 0.2–1.0)
MONOS PCT: 7 %
Neutro Abs: 6.4 10*3/uL (ref 1.4–6.5)
Neutrophils Relative %: 70 %
Platelets: 248 10*3/uL (ref 150–440)
RBC: 4.74 MIL/uL (ref 4.40–5.90)
RDW: 14.4 % (ref 11.5–14.5)
WBC: 9.2 10*3/uL (ref 3.8–10.6)

## 2016-05-14 LAB — TROPONIN I
Troponin I: <0.03 ng/mL (ref <0.03)
Troponin I: <0.03 ng/mL (ref <0.03)

## 2016-05-14 LAB — ETHANOL

## 2016-05-14 MED ORDER — CHLORDIAZEPOXIDE HCL 25 MG PO CAPS: ORAL_CAPSULE | ORAL | 0 refills | Status: DC

## 2016-05-14 MED ORDER — LORAZEPAM 2 MG/ML IJ SOLN
1.0000 mg | Freq: Once | INTRAMUSCULAR | Status: AC
  Administered 2016-05-14: 1 mg via INTRAVENOUS
  Filled 2016-05-14: qty 1

## 2016-05-14 MED ORDER — ASPIRIN 81 MG PO CHEW: 324.0000 mg | CHEWABLE_TABLET | Freq: Once | ORAL | Status: DC

## 2016-05-14 MED ORDER — SODIUM CHLORIDE 0.9 % IV BOLUS (SEPSIS)
500.0000 mL | Freq: Once | INTRAVENOUS | Status: AC
  Administered 2016-05-14: 500 mL via INTRAVENOUS

## 2016-05-14 MED ORDER — NITROGLYCERIN 0.4 MG SL SUBL
0.4000 mg | SUBLINGUAL_TABLET | SUBLINGUAL | Status: DC | PRN
  Filled 2016-05-14: qty 1

## 2016-05-14 NOTE — Discharge Instructions (Signed)

## 2016-05-14 NOTE — ED Notes (Signed)
Pt denies any pain at present pt reports his anxiety is starting to increase reports he does not feel like Ativan helped. RN informed will be doing another blood test

## 2016-05-14 NOTE — ED Notes (Signed)
Dr. Veronese at bedside 

## 2016-05-14 NOTE — ED Notes (Signed)
[facility] administered 324mg  of aspirin

## 2016-05-14 NOTE — ED Notes (Signed)

## 2016-05-14 NOTE — ED Notes (Signed)
Pt states to RN "Do I have to be suicidal to be admitted? I just want to be honest with you I am anxious and I have two kids at home and I don't want to get upset because this makes me upset and I don't want to yell at you" RN asked pt if he feels SI pt did not answered reports he does not want the police to come if he gets mad because he gets upset and starts yelling. RN provided active listening comforted pt and informed that blood work sent to lab to rule out any heart related condition with his symptoms.

## 2016-05-14 NOTE — ED Provider Notes (Signed)
[facility] Emergency Department Provider Note  ____________________________________________  Time seen: Approximately 7:33 AM  I have reviewed the triage vital signs and the nursing notes.   HISTORY  Chief Complaint Chest Pain   HPI Michael Schmitt is a 46 y.o. male with h/o CAD s/p STEMI one month ago on Brilinta, anxiety, depression, alcohol abuse, smoker, DM, HTN, HLD who presents for evaluation of chest pain. Patient reportsthat he has had continuous central chest pain for the last 2 days since running out of his Klonopin. He reports that his psychiatrist stopped prescribing it to him because he was drinking alcohol together with his Klonopin. He reports that he has been very anxious. He reports that his chest pain is moderate, constant, nonradiating, dull. Different than his prior heart attacks. He reports that his last drink was 3 days ago. He denies history of complicated withdrawals. He continues to smoke cigarettes. Denies any other drug use. He reports compliance with his Brilinta. No SOB, URI symptoms, paresthesias, dizziness, abdominal pain, nausea, vomiting, diarrhea. Patient reports that he has been very anxious. Last Wednesday he checked into rehabilitation for alcohol and benzos however they weren't giving him any medications so he left  Past Medical History:  Diagnosis Date  . Anxiety   . Anxiety   . Arthritis    cerv. stenosis, spondylosis, HNP- lower back , has been followed in pain clinic, has  had injection s in cerv. area  . Blood dyscrasia    told that when he was young he was a" free bleeder"  . CAD (coronary artery disease)   . Cervical spondylosis without myelopathy 07/24/2014  . Cervicogenic headache 07/24/2014  . Chronic kidney disease    renal calculi- passed spontaneously  . Depression   . Diabetes mellitus without complication ([place])   . Fatty liver   . GERD (gastroesophageal reflux disease)   . Headache(784.0)   .  Hyperlipidemia   . Hypertension   . Mental disorder   . MI, old   . RLS (restless legs syndrome)    detected on sleep study  . Shortness of breath   . Ventricular fibrillation ([place]) 04/05/2016    Patient Active Problem List   Diagnosis Date Noted  . Alcohol abuse 05/12/2016  . Substance induced mood disorder ([place]) 05/12/2016  . Benzodiazepine dependence ([place]) 05/12/2016  . Ventricular fibrillation ([place]) 04/05/2016  . Coronary stent thrombosis   . Generalized anxiety disorder 08/11/2015  . Major depressive disorder, recurrent episode, moderate with anxious distress ([place]) 06/21/2015  . NSTEMI (non-ST elevated myocardial infarction) ([place]) 06/20/2015  . Acute ST elevation myocardial infarction (STEMI) involving left anterior descending (LAD) coronary artery ([place]) 01/01/2015  . Acute MI, inferoposterior wall ([place]) 01/01/2015  . Common bile duct dilation 10/06/2014  . Right lower quadrant abdominal pain 10/06/2014  . Loss of weight 10/06/2014  . Cervicogenic headache 07/24/2014  . Cervical spondylosis without myelopathy 07/24/2014  . Clinical depression 04/15/2013  . Acid reflux 04/15/2013  . BP (high blood pressure) 04/15/2013  . Encounter for other preprocedural examination 04/15/2013  . Nasal obstruction 02/12/2013  . Inflamed nasal mucosa 02/12/2013  . Sinus infection 02/12/2013  . Gastric catarrh 03/01/2011  . Allergic rhinitis 10/20/2010  . Airway hyperreactivity 10/20/2010  . Anxiety, generalized 10/20/2010  . Cervical pain 10/20/2010  . Current tobacco use 10/20/2010    Past Surgical History:  Procedure Laterality Date  . ANTERIOR CERVICAL DECOMP/DISCECTOMY FUSION  11/13/2011   Procedure: ANTERIOR CERVICAL DECOMPRESSION/DISCECTOMY FUSION 1 LEVEL;  Surgeon: [name], MD;  Location: [place] [facility];  Service: Neurosurgery;  Laterality: N/A;  Anterior Cervical Decompression/discectomy Fusion. Cervical three-four.  Marland Kitchen CARDIAC CATHETERIZATION N/A 01/01/2015   Procedure: Left  Heart Cath and Coronary Angiography;  Surgeon: [name], MD;  Location: [place] [facility];  Service: Cardiovascular;  Laterality: N/A;  . CARDIAC CATHETERIZATION N/A 04/05/2016   Procedure: Left Heart Cath and Coronary Angiography;  Surgeon: Belva Crome, MD;  Location: [place] [facility];  Service: Cardiovascular;  Laterality: N/A;  . CARDIAC CATHETERIZATION N/A 04/05/2016   Procedure: Coronary Stent Intervention;  Surgeon: Belva Crome, MD;  Location: [place] [facility];  Service: Cardiovascular;  Laterality: N/A;  . CARDIAC CATHETERIZATION N/A 04/05/2016   Procedure: Intravascular Ultrasound/IVUS;  Surgeon: Belva Crome, MD;  Location: [place] [facility];  Service: Cardiovascular;  Laterality: N/A;  . NASAL SINUS SURGERY     2005    Prior to Admission medications   Medication Sig Start Date End Date Taking? Authorizing Provider  atorvastatin (LIPITOR) 80 MG tablet Take 1 tablet (80 mg total) by mouth daily at 6 PM. 04/07/16  Yes Brittainy Erie Noe, PA-C  carvedilol (COREG) 6.25 MG tablet Take 1 tablet (6.25 mg total) by mouth 2 (two) times daily with a meal. 04/07/16  Yes Brittainy Erie Noe, PA-C  dexlansoprazole (DEXILANT) 60 MG capsule Take 60 mg by mouth daily.   Yes Historical Provider, MD  lisinopril (PRINIVIL,ZESTRIL) 5 MG tablet Take 1 tablet (5 mg total) by mouth daily. 06/21/15  Yes Bettey Costa, MD  mirtazapine (REMERON) 30 MG tablet Take 30 mg by mouth at bedtime. 03/07/16  Yes Historical Provider, MD  ticagrelor (BRILINTA) 90 MG TABS tablet Take 1 tablet (90 mg total) by mouth 2 (two) times daily. 04/07/16  Yes Brittainy Erie Noe, PA-C  aspirin EC 81 MG EC tablet Take 1 tablet (81 mg total) by mouth daily. Patient not taking: Reported on 04/12/2016 04/08/16   Brittainy M Rosita Fire, PA-C  chlordiazePOXIDE (LIBRIUM) 25 MG capsule 50 mg 3 times a day x 3 days 50 mg 2 times a day for 3 days 50 mg once a day for 3 days stop 05/14/16   Rudene Re, MD  clonazePAM (KLONOPIN) 1 MG  tablet Take 1 tablet (1 mg total) by mouth 3 (three) times daily as needed for anxiety. Patient not taking: Reported on 05/12/2016 03/26/16 04/05/16  Delman Kitten, MD  FLUoxetine (PROZAC) 20 MG capsule Take 1 capsule (20 mg total) by mouth at bedtime. Patient not taking: Reported on 05/14/2016 03/26/16 05/14/16  Delman Kitten, MD  metoprolol tartrate (LOPRESSOR) 25 MG tablet Take 25 mg by mouth 2 (two) times daily.    Historical Provider, MD  nicotine (NICODERM CQ - DOSED IN MG/24 HOURS) 21 mg/24hr patch Place 1 patch (21 mg total) onto the skin daily. Patient not taking: Reported on 04/12/2016 04/08/16   Brittainy M Rosita Fire, PA-C  nitroGLYCERIN (NITROSTAT) 0.4 MG SL tablet Place 1 tablet (0.4 mg total) under the tongue every 5 (five) minutes x 3 doses as needed for chest pain. 04/07/16   Brittainy M Simmons, PA-C  OLANZapine (ZYPREXA) 5 MG tablet Take 1 tablet (5 mg total) by mouth at bedtime. Patient not taking: Reported on 05/11/2016 03/26/16 04/02/16  Delman Kitten, MD    Allergies Prednisone; Hydrocodone; and Varenicline  Family History  Problem Relation Age of Onset  . Prostate cancer Father   . Hypertension Mother   . Kidney Stones Mother   .  Anxiety disorder Mother   . Depression Mother   . COPD Sister   . Hypertension Sister   . Diabetes Sister   . Depression Sister   . Anxiety disorder Sister   . Seizures Sister     Social History Social History  Substance Use Topics  . Smoking status: Current Every Day Smoker    Packs/day: 2.00    Years: 17.00    Types: Cigarettes, Cigars  . Smokeless tobacco: Current User     Comment: occassional snuff   . Alcohol use No     Comment: trying not to drink     Review of Systems  Constitutional: Negative for fever. Eyes: Negative for visual changes. ENT: Negative for sore throat. Neck: No neck pain  Cardiovascular: + chest pain. Respiratory: Negative for shortness of breath. Gastrointestinal: Negative for abdominal pain, vomiting or  diarrhea. Genitourinary: Negative for dysuria. Musculoskeletal: Negative for back pain. Skin: Negative for rash. Neurological: Negative for headaches, weakness or numbness. Psych: No SI or HI + anxiety  ____________________________________________   PHYSICAL EXAM:  VITAL SIGNS: ED Triage Vitals  Enc Vitals Group     BP 05/14/16 0722 (!) 157/97     Pulse Rate 05/14/16 0722 89     Resp 05/14/16 0722 15     Temp 05/14/16 0722 98.6 F (37 C)     Temp Source 05/14/16 0722 Oral     SpO2 05/14/16 0722 99 %     Weight 05/14/16 0723 204 lb (92.5 kg)     Height 05/14/16 0723 6\' 2"  (1.88 m)     Head Circumference --      Peak Flow --      Pain Score 05/14/16 0723 8     Pain Loc --      Pain Edu? --      Excl. in [place]? --     Constitutional: Alert and oriented. Well appearing and in no apparent distress. HEENT:      Head: Normocephalic and atraumatic.         Eyes: Conjunctivae are normal. Sclera is non-icteric. EOMI. PERRL      Mouth/Throat: Mucous membranes are moist.       Neck: Supple with no signs of meningismus. Cardiovascular: Regular rate and rhythm. No murmurs, gallops, or rubs. 2+ symmetrical distal pulses are present in all extremities. No JVD. Respiratory: Normal respiratory effort. Lungs are clear to auscultation bilaterally. No wheezes, crackles, or rhonchi.  Gastrointestinal: Soft, non tender, and non distended with positive bowel sounds. No rebound or guarding. Genitourinary: No CVA tenderness. Musculoskeletal: Nontender with normal range of motion in all extremities. No edema, cyanosis, or erythema of extremities. Neurologic: Normal speech and language. Face is symmetric. Moving all extremities. No gross focal neurologic deficits are appreciated. Skin: Skin is warm, dry and intact. No rash noted. Psychiatric: Mood and affect are normal. Speech and behavior are normal.  ____________________________________________   LABS (all labs ordered are listed, but only  abnormal results are displayed)  Labs Reviewed  BASIC METABOLIC PANEL - Abnormal; Notable for the following:       Result Value   Glucose, Bld 182 (*)    All other components within normal limits  URINE DRUG SCREEN, QUALITATIVE ([facility] ONLY) - Abnormal; Notable for the following:    Benzodiazepine, Ur Scrn POSITIVE (*)    All other components within normal limits  TROPONIN I  CBC WITH DIFFERENTIAL/PLATELET  ETHANOL  TROPONIN I   ____________________________________________  EKG  ED ECG REPORT I,  Rudene Re, the attending physician, personally viewed and interpreted this ECG.  Sinus rhythm, rate of 87, normal intervals, right axis deviation, deep T-wave inversions on anterior and lateral leads which are unchanged from prior. No ST elevation. ____________________________________________  RADIOLOGY  CXR: No acute findigns  ____________________________________________   PROCEDURES  Procedure(s) performed: None Procedures Critical Care performed:  None ____________________________________________   INITIAL IMPRESSION / ASSESSMENT AND PLAN / ED COURSE  46 y.o. male with h/o CAD s/p STEMI one month ago on Brilinta, anxiety, depression, alcohol abuse, smoker, DM, HTN, HLD who presents for evaluation of chest pain. EKG has T-wave T-wave inversions on anterior lateral leads however those are unchanged from prior from 2 days ago. Patient has obvious cardiac disease and needs to be rule out for ACS at this time. The patient is also very anxious to be withdrawing from alcohol and benzos. We'll give her a milligram of IV Ativan. Patient already received a full dose of aspirin. If that doesn't take care of patients pain will give nitroglycerin. We'll cycle cardiac enzymes. Will start patient on Librium taper. Patient does have an appointment with his PCP on Tuesday. We'll check alcohol level and drug screen. Patient has been placed on telemetry.  Clinical Course as of May 14 1210   Sun May 14, 2016  1207 Patient's chest pain resolved with 1 mg of IV Ativan. He was monitored in the emergency department for 5 hours with no arrhythmias or recurrence of his chest pain. Troponin 2 is negative. I had a long discussion with patient about his benzos and alcohol abuse. At this time I will provide him with a Librium taper to help him with withdrawal symptoms. Explained to him that nobody here in emergency department will prescribe him benzodiazepines such as his Klonopin. He does have an appointment with his PCP in 2 days and I told him he needs to have this conversation with his doctor and if no one is willing to give him benzos that he should try different medications for anxiety and his PCP can provide those to him. Patient denies being suicidal or homicidal at this time. He is going to be discharged home.  [CV]    Clinical Course User Index [CV] Rudene Re, MD    Pertinent labs & imaging results that were available during my care of the patient were reviewed by me and considered in my medical decision making (see chart for details).    ____________________________________________   FINAL CLINICAL IMPRESSION(S) / ED DIAGNOSES  Final diagnoses:  Chest pain, unspecified type  Anxiety      NEW MEDICATIONS STARTED DURING THIS VISIT:  New Prescriptions   CHLORDIAZEPOXIDE (LIBRIUM) 25 MG CAPSULE    50 mg 3 times a day x 3 days 50 mg 2 times a day for 3 days 50 mg once a day for 3 days stop     Note:  This document was prepared using Dragon voice recognition software and may include unintentional dictation errors.    Rudene Re, MD 05/14/16 1213

## 2016-05-14 NOTE — ED Triage Notes (Addendum)
Patient presents to the ED with anxiety and left leg and arm pain.  Patient states he is withdrawing from klonopin.  Patient states, "I feel better with ativan.  This is my 5th time up here and I don't want to go home."  Patient states, "they gave me medicine this morning but it's not helping."  Patient states, "I'm off alcohol now and I'd like to go back on klonopin.  I have court tomorrow."  Patient has an appointment with psychiatry Wednesday.

## 2016-05-14 NOTE — ED Notes (Signed)
Pt's sister called and left message for RN to call back to talk about pt's health at [phone], RN talked to pt and provided phone to pt to call his sister informed pt RN unable to discuss any health information over the phone

## 2016-05-14 NOTE — ED Notes (Signed)
Provided pt with an urinal and a warm blanket denies any further needs at present will continue to monitor

## 2016-05-14 NOTE — ED Triage Notes (Signed)
Pt presents to ER via [facility] with complaints of chest pain, pt reports he has been of Klonopin for 9 days, reports "I'm having anxiety" reports has been seen in ER for the same W/Th, has a f/u with PCP 2/20. Pt talks in complete sentences no respiratory distress noted, asked pain level "I'm having anxiety" points to left chest.

## 2016-05-15 ENCOUNTER — Ambulatory Visit: Payer: Medicare Other | Admitting: *Deleted

## 2016-05-16 DIAGNOSIS — I10 Essential (primary) hypertension: Secondary | ICD-10-CM | POA: Diagnosis not present

## 2016-05-22 ENCOUNTER — Encounter: Payer: Self-pay | Admitting: *Deleted

## 2016-05-22 ENCOUNTER — Emergency Department
Admission: EM | Admit: 2016-05-22 | Discharge: 2016-05-22 | Disposition: A | Discharge status: Home / Self Care | Payer: Medicare Other | Attending: Emergency Medicine | Admitting: Emergency Medicine

## 2016-05-22 DIAGNOSIS — I252 Old myocardial infarction: Secondary | ICD-10-CM | POA: Diagnosis not present

## 2016-05-22 DIAGNOSIS — Z9114 Patient's other noncompliance with medication regimen: Secondary | ICD-10-CM | POA: Insufficient documentation

## 2016-05-22 DIAGNOSIS — Z79899 Other long term (current) drug therapy: Secondary | ICD-10-CM | POA: Diagnosis not present

## 2016-05-22 DIAGNOSIS — N189 Chronic kidney disease, unspecified: Secondary | ICD-10-CM | POA: Insufficient documentation

## 2016-05-22 DIAGNOSIS — Z7982 Long term (current) use of aspirin: Secondary | ICD-10-CM | POA: Diagnosis not present

## 2016-05-22 DIAGNOSIS — I129 Hypertensive chronic kidney disease with stage 1 through stage 4 chronic kidney disease, or unspecified chronic kidney disease: Secondary | ICD-10-CM | POA: Insufficient documentation

## 2016-05-22 DIAGNOSIS — R51 Headache: Secondary | ICD-10-CM | POA: Insufficient documentation

## 2016-05-22 DIAGNOSIS — R519 Headache, unspecified: Secondary | ICD-10-CM

## 2016-05-22 DIAGNOSIS — E1122 Type 2 diabetes mellitus with diabetic chronic kidney disease: Secondary | ICD-10-CM | POA: Diagnosis not present

## 2016-05-22 DIAGNOSIS — F17290 Nicotine dependence, other tobacco product, uncomplicated: Secondary | ICD-10-CM | POA: Diagnosis not present

## 2016-05-22 DIAGNOSIS — F17210 Nicotine dependence, cigarettes, uncomplicated: Secondary | ICD-10-CM | POA: Insufficient documentation

## 2016-05-22 DIAGNOSIS — I2510 Atherosclerotic heart disease of native coronary artery without angina pectoris: Secondary | ICD-10-CM | POA: Insufficient documentation

## 2016-05-22 MED ORDER — METOCLOPRAMIDE HCL 5 MG/ML IJ SOLN
10.0000 mg | Freq: Once | INTRAMUSCULAR | Status: AC
  Administered 2016-05-22: 10 mg via INTRAVENOUS
  Filled 2016-05-22: qty 2

## 2016-05-22 MED ORDER — SODIUM CHLORIDE 0.9 % IV SOLN
Freq: Once | INTRAVENOUS | Status: AC
  Administered 2016-05-22: 09:00:00 via INTRAVENOUS

## 2016-05-22 MED ORDER — LORAZEPAM 2 MG/ML IJ SOLN
1.0000 mg | Freq: Once | INTRAMUSCULAR | Status: AC
  Administered 2016-05-22: 1 mg via INTRAVENOUS
  Filled 2016-05-22: qty 1

## 2016-05-22 MED ORDER — KETOROLAC TROMETHAMINE 30 MG/ML IJ SOLN
30.0000 mg | Freq: Once | INTRAMUSCULAR | Status: AC
  Administered 2016-05-22: 30 mg via INTRAVENOUS
  Filled 2016-05-22: qty 1

## 2016-05-22 NOTE — ED Notes (Signed)
Pt resting in bed, eyes closed, resp even and unlabored

## 2016-05-22 NOTE — ED Triage Notes (Signed)
Pt arrives via EMS with complaints of a headache and SI per EMS, upon arrival pt states "my head just feels weird", states he has been off his BP meds for several weeks, when asked about SI pt states "I just want to feel better, I have been off my clonodine since 2/9"

## 2016-05-22 NOTE — ED Notes (Signed)
Per Dr. Jimmye Norman pt not dressed out at this time

## 2016-05-22 NOTE — ED Provider Notes (Signed)
[facility] Emergency Department Provider Note        Time seen: ----------------------------------------- 8:34 AM on 05/22/2016 -----------------------------------------    I have reviewed the triage vital signs and the nursing notes.   HISTORY  Chief Complaint Headache    HPI Michael Schmitt is a 46 y.o. male who presents to ER with headache and suicidal ideation according to EMS. On arrival he states his head just feels weird. Patient reports she's been off his blood pressure medicines for several weeks and reports he doesn't have any Klonopin which he is to take for anxiety. Patient states they took him off of it because he drinks. Patient states he wants to feel better.   Past Medical History:  Diagnosis Date  . Anxiety   . Anxiety   . Arthritis    cerv. stenosis, spondylosis, HNP- lower back , has been followed in pain clinic, has  had injection s in cerv. area  . Blood dyscrasia    told that when he was young he was a" free bleeder"  . CAD (coronary artery disease)   . Cervical spondylosis without myelopathy 07/24/2014  . Cervicogenic headache 07/24/2014  . Chronic kidney disease    renal calculi- passed spontaneously  . Depression   . Diabetes mellitus without complication ([place])   . Fatty liver   . GERD (gastroesophageal reflux disease)   . Headache(784.0)   . Hyperlipidemia   . Hypertension   . Mental disorder   . MI, old   . RLS (restless legs syndrome)    detected on sleep study  . Shortness of breath   . Ventricular fibrillation ([place]) 04/05/2016    Patient Active Problem List   Diagnosis Date Noted  . Alcohol abuse 05/12/2016  . Substance induced mood disorder ([place]) 05/12/2016  . Benzodiazepine dependence ([place]) 05/12/2016  . Ventricular fibrillation ([place]) 04/05/2016  . Coronary stent thrombosis   . Generalized anxiety disorder 08/11/2015  . Major depressive disorder, recurrent episode, moderate with anxious distress ([place])  06/21/2015  . NSTEMI (non-ST elevated myocardial infarction) ([place]) 06/20/2015  . Acute ST elevation myocardial infarction (STEMI) involving left anterior descending (LAD) coronary artery ([place]) 01/01/2015  . Acute MI, inferoposterior wall ([place]) 01/01/2015  . Common bile duct dilation 10/06/2014  . Right lower quadrant abdominal pain 10/06/2014  . Loss of weight 10/06/2014  . Cervicogenic headache 07/24/2014  . Cervical spondylosis without myelopathy 07/24/2014  . Clinical depression 04/15/2013  . Acid reflux 04/15/2013  . BP (high blood pressure) 04/15/2013  . Encounter for other preprocedural examination 04/15/2013  . Nasal obstruction 02/12/2013  . Inflamed nasal mucosa 02/12/2013  . Sinus infection 02/12/2013  . Gastric catarrh 03/01/2011  . Allergic rhinitis 10/20/2010  . Airway hyperreactivity 10/20/2010  . Anxiety, generalized 10/20/2010  . Cervical pain 10/20/2010  . Current tobacco use 10/20/2010    Past Surgical History:  Procedure Laterality Date  . ANTERIOR CERVICAL DECOMP/DISCECTOMY FUSION  11/13/2011   Procedure: ANTERIOR CERVICAL DECOMPRESSION/DISCECTOMY FUSION 1 LEVEL;  Surgeon: [name], MD;  Location: [place] [facility];  Service: Neurosurgery;  Laterality: N/A;  Anterior Cervical Decompression/discectomy Fusion. Cervical three-four.  Marland Kitchen CARDIAC CATHETERIZATION N/A 01/01/2015   Procedure: Left Heart Cath and Coronary Angiography;  Surgeon: [name], MD;  Location: [place] [facility];  Service: Cardiovascular;  Laterality: N/A;  . CARDIAC CATHETERIZATION N/A 04/05/2016   Procedure: Left Heart Cath and Coronary Angiography;  Surgeon: Belva Crome, MD;  Location: [place] [facility];  Service: Cardiovascular;  Laterality: N/A;  . CARDIAC CATHETERIZATION N/A 04/05/2016   Procedure: Coronary Stent Intervention;  Surgeon: Belva Crome, MD;  Location: [place] [facility] LAB;  Service: Cardiovascular;  Laterality: N/A;  . CARDIAC CATHETERIZATION N/A 04/05/2016   Procedure:  Intravascular Ultrasound/IVUS;  Surgeon: Belva Crome, MD;  Location: [place] [facility];  Service: Cardiovascular;  Laterality: N/A;  . NASAL SINUS SURGERY     2005    Allergies Prednisone; Hydrocodone; and Varenicline  Social History Social History  Substance Use Topics  . Smoking status: Current Every Day Smoker    Packs/day: 2.00    Years: 17.00    Types: Cigarettes, Cigars  . Smokeless tobacco: Current User     Comment: occassional snuff   . Alcohol use No     Comment: trying not to drink     Review of Systems Constitutional: Negative for fever. Cardiovascular: Negative for chest pain. Respiratory: Negative for shortness of breath. Gastrointestinal: Negative for abdominal pain, vomiting and diarrhea. Genitourinary: Negative for dysuria. Musculoskeletal: Negative for back pain. Skin: Negative for rash. Neurological: Positive for headache  10-point ROS otherwise negative.  ____________________________________________   PHYSICAL EXAM:  VITAL SIGNS: ED Triage Vitals  Enc Vitals Group     BP 05/22/16 0827 (!) 179/105     Pulse Rate 05/22/16 0827 99     Resp 05/22/16 0827 18     Temp 05/22/16 0827 98.1 F (36.7 C)     Temp Source 05/22/16 0827 Oral     SpO2 05/22/16 0823 96 %     Weight 05/22/16 0827 194 lb (88 kg)     Height 05/22/16 0827 6\' 2"  (1.88 m)     Head Circumference --      Peak Flow --      Pain Score 05/22/16 0827 10     Pain Loc --      Pain Edu? --      Excl. in [place]? --     Constitutional: Alert and oriented. Well appearing and in no distress. Eyes: Conjunctivae are normal. PERRL. Normal extraocular movements. ENT   Head: Normocephalic and atraumatic.   Nose: No congestion/rhinnorhea.   Mouth/Throat: Mucous membranes are moist.   Neck: No stridor. Cardiovascular: Normal rate, regular rhythm. No murmurs, rubs, or gallops. Respiratory: Normal respiratory effort without tachypnea nor retractions. Breath sounds are clear and  equal bilaterally. No wheezes/rales/rhonchi. Gastrointestinal: Soft and nontender. Normal bowel sounds Musculoskeletal: Nontender with normal range of motion in all extremities. No lower extremity tenderness nor edema. Neurologic:  Normal speech and language. No gross focal neurologic deficits are appreciated.  Skin:  Skin is warm, dry and intact. No rash noted. Psychiatric: Depressed mood and affect ____________________________________________  ED COURSE:  Pertinent labs & imaging results that were available during my care of the patient were reviewed by me and considered in my medical decision making (see chart for details). Patient presents to ER with headache and general ill feeling. He has been noncompliant with his medications.   Procedures  ____________________________________________  FINAL ASSESSMENT AND PLAN  Headache  Plan: Patient  is well-known to the ER and presents with headache and after running out of his medications. He was given IV headache cocktail and is encouraged to refill his medications when possible. He is stable for outpatient follow-up.   Earleen Newport, MD   Note: This note was generated in part or whole with voice recognition software. Voice recognition is usually quite accurate but there are transcription errors that can  and very often do occur. I apologize for any typographical errors that were not detected and corrected.     Earleen Newport, MD 05/22/16 1031

## 2016-05-24 ENCOUNTER — Emergency Department
Admission: EM | Admit: 2016-05-24 | Discharge: 2016-05-24 | Disposition: A | Discharge status: Home / Self Care | Payer: Medicare Other | Attending: Emergency Medicine | Admitting: Emergency Medicine

## 2016-05-24 DIAGNOSIS — I2510 Atherosclerotic heart disease of native coronary artery without angina pectoris: Secondary | ICD-10-CM | POA: Insufficient documentation

## 2016-05-24 DIAGNOSIS — F339 Major depressive disorder, recurrent, unspecified: Secondary | ICD-10-CM | POA: Insufficient documentation

## 2016-05-24 DIAGNOSIS — R45851 Suicidal ideations: Secondary | ICD-10-CM | POA: Diagnosis present

## 2016-05-24 DIAGNOSIS — F17210 Nicotine dependence, cigarettes, uncomplicated: Secondary | ICD-10-CM | POA: Diagnosis not present

## 2016-05-24 DIAGNOSIS — F17290 Nicotine dependence, other tobacco product, uncomplicated: Secondary | ICD-10-CM | POA: Diagnosis not present

## 2016-05-24 DIAGNOSIS — Z7982 Long term (current) use of aspirin: Secondary | ICD-10-CM | POA: Insufficient documentation

## 2016-05-24 DIAGNOSIS — E119 Type 2 diabetes mellitus without complications: Secondary | ICD-10-CM | POA: Diagnosis not present

## 2016-05-24 DIAGNOSIS — I10 Essential (primary) hypertension: Secondary | ICD-10-CM | POA: Insufficient documentation

## 2016-05-24 DIAGNOSIS — F331 Major depressive disorder, recurrent, moderate: Secondary | ICD-10-CM | POA: Diagnosis not present

## 2016-05-24 DIAGNOSIS — Z79899 Other long term (current) drug therapy: Secondary | ICD-10-CM | POA: Insufficient documentation

## 2016-05-24 DIAGNOSIS — F1320 Sedative, hypnotic or anxiolytic dependence, uncomplicated: Secondary | ICD-10-CM | POA: Diagnosis present

## 2016-05-24 LAB — URINE DRUG SCREEN, QUALITATIVE (ARMC ONLY)
AMPHETAMINES, UR SCREEN: NOT DETECTED
BENZODIAZEPINE, UR SCRN: POSITIVE — AB
Barbiturates, Ur Screen: NOT DETECTED
CANNABINOID 50 NG, UR ~~LOC~~: NOT DETECTED
Cocaine Metabolite,Ur ~~LOC~~: NOT DETECTED
MDMA (Ecstasy)Ur Screen: NOT DETECTED
Methadone Scn, Ur: NOT DETECTED
OPIATE, UR SCREEN: NOT DETECTED
PHENCYCLIDINE (PCP) UR S: POSITIVE — AB
Tricyclic, Ur Screen: NOT DETECTED

## 2016-05-24 LAB — COMPREHENSIVE METABOLIC PANEL
ALBUMIN: 4.2 g/dL (ref 3.5–5.0)
ALK PHOS: 112 U/L (ref 38–126)
ALT: 13 U/L — ABNORMAL LOW (ref 17–63)
AST: 16 U/L (ref 15–41)
Anion gap: 14 (ref 5–15)
BILIRUBIN TOTAL: 0.9 mg/dL (ref 0.3–1.2)
BUN: 9 mg/dL (ref 6–20)
CALCIUM: 9.2 mg/dL (ref 8.9–10.3)
CO2: 22 mmol/L (ref 22–32)
CREATININE: 0.98 mg/dL (ref 0.61–1.24)
Chloride: 101 mmol/L (ref 101–111)
GFR calc Af Amer: >60 mL/min (ref >60)
GLUCOSE: 92 mg/dL (ref 65–99)
Potassium: 4.2 mmol/L (ref 3.5–5.1)
Sodium: 137 mmol/L (ref 135–145)
TOTAL PROTEIN: 7.8 g/dL (ref 6.5–8.1)

## 2016-05-24 LAB — CBC
HEMATOCRIT: 48.5 % (ref 40.0–52.0)
Hemoglobin: 15.8 g/dL (ref 13.0–18.0)
MCH: 29.7 pg (ref 26.0–34.0)
MCHC: 32.6 g/dL (ref 32.0–36.0)
MCV: 91 fL (ref 80.0–100.0)
PLATELETS: 310 10*3/uL (ref 150–440)
RBC: 5.33 MIL/uL (ref 4.40–5.90)
RDW: 14.9 % — AB (ref 11.5–14.5)
WBC: 11.7 10*3/uL — ABNORMAL HIGH (ref 3.8–10.6)

## 2016-05-24 LAB — ETHANOL

## 2016-05-24 LAB — ACETAMINOPHEN LEVEL: Acetaminophen (Tylenol), Serum: <10 ug/mL — ABNORMAL LOW (ref 10–30)

## 2016-05-24 LAB — SALICYLATE LEVEL: Salicylate Lvl: <7 mg/dL (ref 2.8–30.0)

## 2016-05-24 MED ORDER — ACETAMINOPHEN 500 MG PO TABS
ORAL_TABLET | ORAL | Status: AC
  Administered 2016-05-24: 1000 mg via ORAL
  Filled 2016-05-24: qty 2

## 2016-05-24 MED ORDER — QUETIAPINE FUMARATE 100 MG PO TABS: 100.0000 mg | ORAL_TABLET | Freq: Every day | ORAL | 0 refills | Status: DC

## 2016-05-24 MED ORDER — ACETAMINOPHEN 500 MG PO TABS
1000.0000 mg | ORAL_TABLET | Freq: Once | ORAL | Status: AC
  Administered 2016-05-24: 1000 mg via ORAL

## 2016-05-24 NOTE — ED Notes (Signed)
Pt given warm blanket and offered a breakfast tray, but refused.

## 2016-05-24 NOTE — ED Notes (Signed)
Pt ambulated to bathroom with no assistance. Pt requested pain med for neck. RN notified.

## 2016-05-24 NOTE — ED Provider Notes (Signed)
Patient has been cleared again by psychiatry for discharge. He's been prescribed Seroquel and is encouraged to have outpatient follow-up.   Earleen Newport, MD 05/24/16 1250

## 2016-05-24 NOTE — Consult Note (Signed)
[facility] Consult   Reason for Consult:  Consult for 46 year old man with history of chronic depression and anxiety who came to the emergency room complaining of his usual anxiety Referring Physician:  Jimmye Norman Patient Identification: Michael Schmitt MRN:  [id number] Principal Diagnosis: Major depressive disorder, recurrent episode, moderate with anxious distress [facility]) Diagnosis:   Patient Active Problem List   Diagnosis Date Noted  . Alcohol abuse [F10.10] 05/12/2016  . Substance induced mood disorder ([place]) [F19.94] 05/12/2016  . Benzodiazepine dependence ([place]) [F13.20] 05/12/2016  . Ventricular fibrillation ([place]) [I49.01] 04/05/2016  . 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 Generalized anxiety disorder [F41.1] 08/11/2015  . Major depressive disorder, recurrent episode, moderate with anxious distress ([place]) [F33.1] 06/21/2015  . NSTEMI (non-ST elevated myocardial infarction) ([place]) [I21.4] 06/20/2015  . Acute ST elevation myocardial infarction (STEMI) involving left anterior descending (LAD) coronary artery ([place]) [I21.02] 01/01/2015  . Acute MI, inferoposterior wall ([place]) [I21.19] 01/01/2015  . Common bile duct dilation [K83.8] 10/06/2014  . Right lower quadrant abdominal pain [R10.31] 10/06/2014  . Loss of weight [R63.4] 10/06/2014  . Cervicogenic headache [R51] 07/24/2014  . Cervical spondylosis without myelopathy [M47.812] 07/24/2014  . Clinical depression [F32.9] 04/15/2013  . Acid reflux [K21.9] 04/15/2013  . BP (high blood pressure) [I10] 04/15/2013  . Encounter for other preprocedural examination [K81.275] 04/15/2013  . Nasal obstruction [J34.89] 02/12/2013  . Inflamed nasal mucosa [J31.0] 02/12/2013  . Sinus infection [J32.9] 02/12/2013  . Gastric catarrh [K29.70] 03/01/2011  . Allergic rhinitis [J30.9] 10/20/2010  . Airway hyperreactivity [J45.909] 10/20/2010  . Anxiety, generalized [F41.1] 10/20/2010  . Cervical pain [M54.2] 10/20/2010  . Current  tobacco use [Z72.0] 10/20/2010    Total Time spent with patient: 1 hour  Subjective:   Michael Schmitt is a 46 y.o. male patient admitted with "I'm just very depressed".  HPI:  Patient interviewed. Chart reviewed. Patient well known to me from previous encounters. 46 year old man who has a history of chronic anxiety and depression as well as a history of chronic dependency on benzodiazepines and abuse of alcohol. He came to the emergency room today complaining of chronic depression and anxiety essentially the same symptoms that he always has. He says he's been sleeping poorly recently. He feels stressed out and hopeless about his situation. Major stresses that he has 2 DWI charges hanging over him. His attorney has evidently told him that he can count on doing some jail time. Although there is no time course for any of this the patient remains very anxious and focused on it. He tells me that his mind will think about suicide but doesn't have any plan about it and has no intention. As always he reacts very strongly against the idea and says he would never try to kill himself. Not having any psychotic symptoms. He says he is continuing to take his Depakote Lexapro and mirtazapine but has not been taking any kind of benzodiazepines or sedatives. He has been told by his outpatient psychiatrist, Dr. Kasandra Knudsen, that he needs to come and make an in person appointment if he wants to be reevaluated for that sort of medicine. Patient has not made this appointment because of his belief that he can't arrange the transport. Drug screen is positive for benzodiazepines indicating that he is certainly getting some benzodiazepines off the street somehow although he denies it. Denies that he is drinking alcohol. He was given a prescription for Seroquel last time he came through the emergency room to help him with sleep  but never got it filled.  History: He gets disability for his neck pain. Lives with his mother and sister  neither of whom work either. He has 2 children ages 62 and 78 who live with him. He claims that among all of them in the household they do have one working vehicle but that his sister refuses to help to drive him to [place].  Medical history: Multiple medical problems including diabetes and high blood pressure chronic neck pain  Substance abuse history: Long history of alcohol abuse as well as abuse and dependency on prescription benzodiazepines. Patient says he is not drinking recently. He claims he is not taking any benzodiazepines although the drug screen indicates otherwise. His insight about all of this is not good.    Past Psychiatric History: Patient has a history of the symptoms going back years. Nothing really has consistently gotten him back to a functional. Patient tends to be very dependent and throw up a lot of excuses rather than work on finding solutions to his problems. He does not have any history of suicide attempts or violence or psychosis.  Risk to Self: Is patient at risk for suicide?: Yes Risk to Others:   Prior Inpatient Therapy:   Prior Outpatient Therapy:    Past Medical History:  Past Medical History:  Diagnosis Date  . Anxiety   . Anxiety   . Arthritis    cerv. stenosis, spondylosis, HNP- lower back , has been followed in pain clinic, has  had injection s in cerv. area  . Blood dyscrasia    told that when he was young he was a" free bleeder"  . CAD (coronary artery disease)   . Cervical spondylosis without myelopathy 07/24/2014  . Cervicogenic headache 07/24/2014  . Chronic kidney disease    renal calculi- passed spontaneously  . Depression   . Diabetes mellitus without complication ([place])   . Fatty liver   . GERD (gastroesophageal reflux disease)   . Headache(784.0)   . Hyperlipidemia   . Hypertension   . Mental disorder   . MI, old   . RLS (restless legs syndrome)    detected on sleep study  . Shortness of breath   . Ventricular fibrillation ([place])  04/05/2016    Past Surgical History:  Procedure Laterality Date  . ANTERIOR CERVICAL DECOMP/DISCECTOMY FUSION  11/13/2011   Procedure: ANTERIOR CERVICAL DECOMPRESSION/DISCECTOMY FUSION 1 LEVEL;  Surgeon: [name], MD;  Location: [facility];  Service: Neurosurgery;  Laterality: N/A;  Anterior Cervical Decompression/discectomy Fusion. Cervical three-four.  Marland Kitchen CARDIAC CATHETERIZATION N/A 01/01/2015   Procedure: Left Heart Cath and Coronary Angiography;  Surgeon: [name], MD;  Location: [facility];  Service: Cardiovascular;  Laterality: N/A;  . CARDIAC CATHETERIZATION N/A 04/05/2016   Procedure: Left Heart Cath and Coronary Angiography;  Surgeon: Belva Crome, MD;  Location: [facility];  Service: Cardiovascular;  Laterality: N/A;  . CARDIAC CATHETERIZATION N/A 04/05/2016   Procedure: Coronary Stent Intervention;  Surgeon: Belva Crome, MD;  Location: [facility];  Service: Cardiovascular;  Laterality: N/A;  . CARDIAC CATHETERIZATION N/A 04/05/2016   Procedure: Intravascular Ultrasound/IVUS;  Surgeon: Belva Crome, MD;  Location: [facility];  Service: Cardiovascular;  Laterality: N/A;  . NASAL SINUS SURGERY     2005   Family History:  Family History  Problem Relation Age of Onset  . Prostate cancer Father   . Hypertension Mother   . Kidney Stones Mother   .  Anxiety disorder Mother   . Depression Mother   . COPD Sister   . Hypertension Sister   . Diabetes Sister   . Depression Sister   . Anxiety disorder Sister   . Seizures Sister    Family Psychiatric  History: Family history positive for anxiety Social History:  History  Alcohol Use No    Comment: trying not to drink      History  Drug Use No    Social History   Social History  . Marital status: Widowed    Spouse name: N/A  . Number of children: 3  . Years of education: 12   Occupational History  .      unemployed   Social History Main Topics  . Smoking status: Current Every Day  Smoker    Packs/day: 2.00    Years: 17.00    Types: Cigarettes, Cigars  . Smokeless tobacco: Current User     Comment: occassional snuff   . Alcohol use No     Comment: trying not to drink   . Drug use: No  . Sexual activity: Not Currently   Other Topics Concern  . Not on file   Social History Narrative   Patient is right handed.   Patient drinks 2 sodas daily.   Additional Social History:    Allergies:   Allergies  Allergen Reactions  . Prednisone Other (See Comments)    Hypertension, makes him feel spacey  . Hydrocodone Other (See Comments)    Headache, irritable  . Varenicline Other (See Comments)    Suicidal thoughts    Labs:  Results for orders placed or performed during the hospital encounter of 05/24/16 (from the past 48 hour(s))  Comprehensive metabolic panel     Status: Abnormal   Collection Time: 05/24/16  8:17 AM  Result Value Ref Range   Sodium 137 135 - 145 mmol/L   Potassium 4.2 3.5 - 5.1 mmol/L   Chloride 101 101 - 111 mmol/L   CO2 22 22 - 32 mmol/L   Glucose, Bld 92 65 - 99 mg/dL   BUN 9 6 - 20 mg/dL   Creatinine, Ser 0.98 0.61 - 1.24 mg/dL   Calcium 9.2 8.9 - 10.3 mg/dL   Total Protein 7.8 6.5 - 8.1 g/dL   Albumin 4.2 3.5 - 5.0 g/dL   AST 16 15 - 41 U/L   ALT 13 (L) 17 - 63 U/L   Alkaline Phosphatase 112 38 - 126 U/L   Total Bilirubin 0.9 0.3 - 1.2 mg/dL   GFR calc non Af Amer >60 >60 mL/min   GFR calc Af Amer >60 >60 mL/min    Comment: (NOTE) The eGFR has been calculated using the CKD EPI equation. This calculation has not been validated in all clinical situations. eGFR's persistently <60 mL/min signify possible Chronic Kidney Disease.    Anion gap 14 5 - 15  Ethanol     Status: None   Collection Time: 05/24/16  8:17 AM  Result Value Ref Range   Alcohol, Ethyl (B) <5 <5 mg/dL    Comment:        LOWEST DETECTABLE LIMIT FOR SERUM ALCOHOL IS 5 mg/dL FOR MEDICAL PURPOSES ONLY   Salicylate level     Status: None   Collection Time:  05/24/16  8:17 AM  Result Value Ref Range   Salicylate Lvl <4.9 2.8 - 30.0 mg/dL  Acetaminophen level     Status: Abnormal   Collection Time: 05/24/16  8:17 AM  Result Value Ref Range   Acetaminophen (Tylenol), Serum <10 (L) 10 - 30 ug/mL    Comment:        THERAPEUTIC CONCENTRATIONS VARY SIGNIFICANTLY. A RANGE OF 10-30 ug/mL MAY BE AN EFFECTIVE CONCENTRATION FOR MANY PATIENTS. HOWEVER, SOME ARE BEST TREATED AT CONCENTRATIONS OUTSIDE THIS RANGE. ACETAMINOPHEN CONCENTRATIONS >150 ug/mL AT 4 HOURS AFTER INGESTION AND >50 ug/mL AT 12 HOURS AFTER INGESTION ARE OFTEN ASSOCIATED WITH TOXIC REACTIONS.   cbc     Status: Abnormal   Collection Time: 05/24/16  8:17 AM  Result Value Ref Range   WBC 11.7 (H) 3.8 - 10.6 K/uL   RBC 5.33 4.40 - 5.90 MIL/uL   Hemoglobin 15.8 13.0 - 18.0 g/dL   HCT 48.5 40.0 - 52.0 %   MCV 91.0 80.0 - 100.0 fL   MCH 29.7 26.0 - 34.0 pg   MCHC 32.6 32.0 - 36.0 g/dL   RDW 14.9 (H) 11.5 - 14.5 %   Platelets 310 150 - 440 K/uL  Urine Drug Screen, Qualitative     Status: Abnormal   Collection Time: 05/24/16  8:17 AM  Result Value Ref Range   Tricyclic, Ur Screen NONE DETECTED NONE DETECTED   Amphetamines, Ur Screen NONE DETECTED NONE DETECTED   MDMA (Ecstasy)Ur Screen NONE DETECTED NONE DETECTED   Cocaine Metabolite,Ur [place] NONE DETECTED NONE DETECTED   Opiate, Ur Screen NONE DETECTED NONE DETECTED   Phencyclidine (PCP) Ur S POSITIVE (A) NONE DETECTED   Cannabinoid 50 Ng, Ur [place] NONE DETECTED NONE DETECTED   Barbiturates, Ur Screen NONE DETECTED NONE DETECTED   Benzodiazepine, Ur Scrn POSITIVE (A) NONE DETECTED   Methadone Scn, Ur NONE DETECTED NONE DETECTED    Comment: (NOTE) 782  Tricyclics, urine               Cutoff 1000 ng/mL 200  Amphetamines, urine             Cutoff 1000 ng/mL 300  MDMA (Ecstasy), urine           Cutoff 500 ng/mL 400  Cocaine Metabolite, urine       Cutoff 300 ng/mL 500  Opiate, urine                   Cutoff 300 ng/mL 600   Phencyclidine (PCP), urine      Cutoff 25 ng/mL 700  Cannabinoid, urine              Cutoff 50 ng/mL 800  Barbiturates, urine             Cutoff 200 ng/mL 900  Benzodiazepine, urine           Cutoff 200 ng/mL 1000 Methadone, urine                Cutoff 300 ng/mL 1100 1200 The urine drug screen provides only a preliminary, unconfirmed 1300 analytical test result and should not be used for non-medical 1400 purposes. Clinical consideration and professional judgment should 1500 be applied to any positive drug screen result due to possible 1600 interfering substances. A more specific alternate chemical method 1700 must be used in order to obtain a confirmed analytical result.  1800 Gas chromato graphy / mass spectrometry (GC/MS) is the preferred 1900 confirmatory method.     No current facility-administered medications for this encounter.    Current Outpatient Prescriptions  Medication Sig Dispense Refill  . atorvastatin (LIPITOR) 80 MG tablet Take 1 tablet (80 mg total) by mouth daily at 6  PM. 30 tablet 5  . carvedilol (COREG) 6.25 MG tablet Take 1 tablet (6.25 mg total) by mouth 2 (two) times daily with a meal. 60 tablet 5  . dexlansoprazole (DEXILANT) 60 MG capsule Take 60 mg by mouth daily.    . divalproex (DEPAKOTE ER) 500 MG 24 hr tablet Take 500 mg by mouth 2 (two) times daily.    Marland Kitchen escitalopram (LEXAPRO) 20 MG tablet Take 20 mg by mouth 2 (two) times daily.    Marland Kitchen lisinopril (PRINIVIL,ZESTRIL) 5 MG tablet Take 1 tablet (5 mg total) by mouth daily. 30 tablet 0  . metoprolol tartrate (LOPRESSOR) 25 MG tablet Take 25 mg by mouth 2 (two) times daily.    . mirtazapine (REMERON) 30 MG tablet Take 60 mg by mouth at bedtime.   4  . nitroGLYCERIN (NITROSTAT) 0.4 MG SL tablet Place 1 tablet (0.4 mg total) under the tongue every 5 (five) minutes x 3 doses as needed for chest pain. 25 tablet 2  . ticagrelor (BRILINTA) 90 MG TABS tablet Take 1 tablet (90 mg total) by mouth 2 (two) times daily. 60  tablet 10  . aspirin EC 81 MG EC tablet Take 1 tablet (81 mg total) by mouth daily. (Patient not taking: Reported on 04/12/2016)    . chlordiazePOXIDE (LIBRIUM) 25 MG capsule 50 mg 3 times a day x 3 days 50 mg 2 times a day for 3 days 50 mg once a day for 3 days stop 36 capsule 0  . clonazePAM (KLONOPIN) 1 MG tablet Take 1 tablet (1 mg total) by mouth 3 (three) times daily as needed for anxiety. (Patient not taking: Reported on 05/12/2016) 21 tablet 0  . FLUoxetine (PROZAC) 20 MG capsule Take 1 capsule (20 mg total) by mouth at bedtime. (Patient not taking: Reported on 05/14/2016) 7 capsule 0  . nicotine (NICODERM CQ - DOSED IN MG/24 HOURS) 21 mg/24hr patch Place 1 patch (21 mg total) onto the skin daily. (Patient not taking: Reported on 04/12/2016) 28 patch 0  . OLANZapine (ZYPREXA) 5 MG tablet Take 1 tablet (5 mg total) by mouth at bedtime. (Patient not taking: Reported on 05/11/2016) 7 tablet 0  . QUEtiapine (SEROQUEL) 100 MG tablet Take 1 tablet (100 mg total) by mouth at bedtime. 30 tablet 0    Musculoskeletal: Strength & Muscle Tone: within normal limits Gait & Station: normal Patient leans: N/A  Psychiatric Specialty Exam: Physical Exam  Nursing note and vitals reviewed. Constitutional: He appears well-developed and well-nourished.  HENT:  Head: Normocephalic and atraumatic.  Eyes: Conjunctivae are normal. Pupils are equal, round, and reactive to light.  Neck: Normal range of motion.  Cardiovascular: Regular rhythm and normal heart sounds.   Respiratory: Effort normal and breath sounds normal.  GI: Soft.  Musculoskeletal: Normal range of motion.  Neurological: He is alert.  Skin: Skin is warm and dry.  Psychiatric: His mood appears anxious. His affect is blunt. His speech is delayed. He is slowed. Cognition and memory are normal. He expresses impulsivity. He expresses no suicidal ideation.    Review of Systems  Constitutional: Negative.   HENT: Negative.   Eyes: Negative.    Respiratory: Negative.   Cardiovascular: Negative.   Gastrointestinal: Negative.   Musculoskeletal: Negative.   Skin: Negative.   Neurological: Negative.   Psychiatric/Behavioral: Positive for depression and substance abuse. Negative for hallucinations, memory loss and suicidal ideas. The patient is nervous/anxious and has insomnia.     Blood pressure (!) 148/87, pulse 87, temperature 98.4  F (36.9 C), temperature source Oral, resp. rate 18, height '6\' 2"'  (1.88 m), weight 88 kg (194 lb), SpO2 96 %.Body mass index is 24.91 kg/m.  General Appearance: Casual  Eye Contact:  Good  Speech:  Clear and Coherent  Volume:  Normal  Mood:  Anxious and Depressed  Affect:  Congruent  Thought Process:  Goal Directed  Orientation:  Full (Time, Place, and Person)  Thought Content:  Logical and Rumination  Suicidal Thoughts:  Yes.  without intent/plan  Homicidal Thoughts:  No  Memory:  Immediate;   Good Recent;   Fair Remote;   Fair  Judgement:  Impaired  Insight:  Shallow  Psychomotor Activity:  Decreased  Concentration:  Concentration: Fair  Recall:  [employer] of Knowledge:  Fair  Language:  Fair  Akathisia:  No  Handed:  Right  AIMS (if indicated):     Assets:  Desire for Improvement Housing Social Support  ADL's:  Intact  Cognition:  WNL  Sleep:        Treatment Plan Summary: Medication management and Plan This is a patient with chronic anxiety and depression and chronic dependency on benzodiazepines. It's pretty clear that he has come into the emergency room for the same basic agenda as previously which is that he would like [place] to give him a prescription for clonazepam or a similar medicine. I have made it clear to him as have the emergency room doctors that this is not going to happen because of his history of abuse of alcohol and drugs as well as his lack of any plan to get follow-up treatment and his clear history of alcohol abuse. Patient has been informed that he absolutely  needs to get back to see Dr. Kasandra Knudsen to have an outpatient appointment if he wants any appropriate further treatment for his anxiety. Meanwhile I agreed to once again try to address his complaints about sleep by giving him a prescription for 100 mg of Seroquel at night as needed for sleep for 1 month. Patient can be released from the ER. Case reviewed with TTS and emergency room physician  Disposition: Patient does not meet criteria for psychiatric inpatient admission. Supportive therapy provided about ongoing stressors. Discussed crisis plan, support from social network, calling 911, coming to the Emergency Department, and calling Suicide Hotline.  Alethia Berthold, MD 05/24/2016 12:50 PM

## 2016-05-24 NOTE — ED Triage Notes (Signed)
He arrives with [name] with reports of being taken off his klonopin "cold [place]" on the 9th and he has been feeling suicidal  "I just can't do this anymore"
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
## 2016-05-24 NOTE — ED Notes (Signed)
Pt reports neck pain, pt reports he usually takes goodies powder at home for his neck pain, MD made aware.

## 2016-05-24 NOTE — ED Notes (Signed)
Pt given lunch tray.

## 2016-05-26 ENCOUNTER — Inpatient Hospital Stay
Admission: RE | Admit: 2016-05-26 | Discharge: 2016-06-02 | DRG: 885 | Disposition: A | Discharge status: Home / Self Care | Payer: Medicare Other | Source: Intra-hospital | Attending: Psychiatry | Admitting: Psychiatry

## 2016-05-26 ENCOUNTER — Encounter: Payer: Self-pay | Admitting: Emergency Medicine

## 2016-05-26 ENCOUNTER — Emergency Department
Admission: EM | Admit: 2016-05-26 | Discharge: 2016-05-26 | Disposition: A | Discharge status: Other Acute Inpatient Hospital | Payer: Medicare Other | Attending: Emergency Medicine | Admitting: Emergency Medicine

## 2016-05-26 DIAGNOSIS — E7800 Pure hypercholesterolemia, unspecified: Secondary | ICD-10-CM | POA: Diagnosis present

## 2016-05-26 DIAGNOSIS — I249 Acute ischemic heart disease, unspecified: Secondary | ICD-10-CM | POA: Diagnosis present

## 2016-05-26 DIAGNOSIS — Z87442 Personal history of urinary calculi: Secondary | ICD-10-CM

## 2016-05-26 DIAGNOSIS — F331 Major depressive disorder, recurrent, moderate: Secondary | ICD-10-CM | POA: Diagnosis not present

## 2016-05-26 DIAGNOSIS — K219 Gastro-esophageal reflux disease without esophagitis: Secondary | ICD-10-CM | POA: Diagnosis present

## 2016-05-26 DIAGNOSIS — I10 Essential (primary) hypertension: Secondary | ICD-10-CM | POA: Diagnosis present

## 2016-05-26 DIAGNOSIS — E119 Type 2 diabetes mellitus without complications: Secondary | ICD-10-CM | POA: Diagnosis present

## 2016-05-26 DIAGNOSIS — G4700 Insomnia, unspecified: Secondary | ICD-10-CM | POA: Diagnosis present

## 2016-05-26 DIAGNOSIS — Z818 Family history of other mental and behavioral disorders: Secondary | ICD-10-CM

## 2016-05-26 DIAGNOSIS — F411 Generalized anxiety disorder: Secondary | ICD-10-CM | POA: Diagnosis present

## 2016-05-26 DIAGNOSIS — E785 Hyperlipidemia, unspecified: Secondary | ICD-10-CM | POA: Diagnosis present

## 2016-05-26 DIAGNOSIS — R45851 Suicidal ideations: Secondary | ICD-10-CM | POA: Diagnosis present

## 2016-05-26 DIAGNOSIS — F332 Major depressive disorder, recurrent severe without psychotic features: Principal | ICD-10-CM | POA: Diagnosis present

## 2016-05-26 DIAGNOSIS — F1320 Sedative, hypnotic or anxiolytic dependence, uncomplicated: Secondary | ICD-10-CM | POA: Diagnosis present

## 2016-05-26 DIAGNOSIS — Z888 Allergy status to other drugs, medicaments and biological substances status: Secondary | ICD-10-CM

## 2016-05-26 DIAGNOSIS — N189 Chronic kidney disease, unspecified: Secondary | ICD-10-CM | POA: Insufficient documentation

## 2016-05-26 DIAGNOSIS — Z955 Presence of coronary angioplasty implant and graft: Secondary | ICD-10-CM

## 2016-05-26 DIAGNOSIS — F329 Major depressive disorder, single episode, unspecified: Secondary | ICD-10-CM | POA: Insufficient documentation

## 2016-05-26 DIAGNOSIS — Z833 Family history of diabetes mellitus: Secondary | ICD-10-CM

## 2016-05-26 DIAGNOSIS — Y900 Blood alcohol level of less than 20 mg/100 ml: Secondary | ICD-10-CM | POA: Diagnosis present

## 2016-05-26 DIAGNOSIS — I2510 Atherosclerotic heart disease of native coronary artery without angina pectoris: Secondary | ICD-10-CM | POA: Insufficient documentation

## 2016-05-26 DIAGNOSIS — Z9119 Patient's noncompliance with other medical treatment and regimen: Secondary | ICD-10-CM | POA: Diagnosis not present

## 2016-05-26 DIAGNOSIS — Z7982 Long term (current) use of aspirin: Secondary | ICD-10-CM

## 2016-05-26 DIAGNOSIS — F1010 Alcohol abuse, uncomplicated: Secondary | ICD-10-CM | POA: Diagnosis present

## 2016-05-26 DIAGNOSIS — F17290 Nicotine dependence, other tobacco product, uncomplicated: Secondary | ICD-10-CM | POA: Insufficient documentation

## 2016-05-26 DIAGNOSIS — K760 Fatty (change of) liver, not elsewhere classified: Secondary | ICD-10-CM | POA: Diagnosis present

## 2016-05-26 DIAGNOSIS — Z8249 Family history of ischemic heart disease and other diseases of the circulatory system: Secondary | ICD-10-CM

## 2016-05-26 DIAGNOSIS — F17200 Nicotine dependence, unspecified, uncomplicated: Secondary | ICD-10-CM | POA: Diagnosis present

## 2016-05-26 DIAGNOSIS — G2581 Restless legs syndrome: Secondary | ICD-10-CM | POA: Diagnosis present

## 2016-05-26 DIAGNOSIS — I252 Old myocardial infarction: Secondary | ICD-10-CM | POA: Diagnosis not present

## 2016-05-26 DIAGNOSIS — Z885 Allergy status to narcotic agent status: Secondary | ICD-10-CM

## 2016-05-26 DIAGNOSIS — F17210 Nicotine dependence, cigarettes, uncomplicated: Secondary | ICD-10-CM | POA: Diagnosis present

## 2016-05-26 DIAGNOSIS — Z79899 Other long term (current) drug therapy: Secondary | ICD-10-CM

## 2016-05-26 DIAGNOSIS — F429 Obsessive-compulsive disorder, unspecified: Secondary | ICD-10-CM | POA: Diagnosis present

## 2016-05-26 DIAGNOSIS — E1122 Type 2 diabetes mellitus with diabetic chronic kidney disease: Secondary | ICD-10-CM | POA: Insufficient documentation

## 2016-05-26 DIAGNOSIS — F410 Panic disorder [episodic paroxysmal anxiety] without agoraphobia: Secondary | ICD-10-CM | POA: Diagnosis present

## 2016-05-26 DIAGNOSIS — I129 Hypertensive chronic kidney disease with stage 1 through stage 4 chronic kidney disease, or unspecified chronic kidney disease: Secondary | ICD-10-CM | POA: Insufficient documentation

## 2016-05-26 DIAGNOSIS — F32A Depression, unspecified: Secondary | ICD-10-CM

## 2016-05-26 DIAGNOSIS — R531 Weakness: Secondary | ICD-10-CM | POA: Diagnosis not present

## 2016-05-26 DIAGNOSIS — R6889 Other general symptoms and signs: Secondary | ICD-10-CM | POA: Diagnosis not present

## 2016-05-26 DIAGNOSIS — I25119 Atherosclerotic heart disease of native coronary artery with unspecified angina pectoris: Secondary | ICD-10-CM | POA: Diagnosis present

## 2016-05-26 MED ORDER — MAGNESIUM HYDROXIDE 400 MG/5ML PO SUSP: 30.0000 mL | Freq: Every day | ORAL | Status: DC | PRN

## 2016-05-26 MED ORDER — LISINOPRIL 5 MG PO TABS
ORAL_TABLET | ORAL | Status: AC
  Filled 2016-05-26: qty 1

## 2016-05-26 MED ORDER — CARVEDILOL 6.25 MG PO TABS
6.2500 mg | ORAL_TABLET | Freq: Two times a day (BID) | ORAL | Status: DC
Start: 2016-05-26 — End: 2016-05-26
  Administered 2016-05-26: 6.25 mg via ORAL

## 2016-05-26 MED ORDER — CARVEDILOL 6.25 MG PO TABS
ORAL_TABLET | ORAL | Status: AC
  Filled 2016-05-26: qty 1

## 2016-05-26 MED ORDER — ASPIRIN EC 81 MG PO TBEC
DELAYED_RELEASE_TABLET | ORAL | Status: AC
  Filled 2016-05-26: qty 1

## 2016-05-26 MED ORDER — PANTOPRAZOLE SODIUM 40 MG PO TBEC
DELAYED_RELEASE_TABLET | ORAL | Status: AC
  Filled 2016-05-26: qty 1

## 2016-05-26 MED ORDER — ESCITALOPRAM OXALATE 10 MG PO TABS
ORAL_TABLET | ORAL | Status: AC
  Filled 2016-05-26: qty 2

## 2016-05-26 MED ORDER — TRAZODONE HCL 100 MG PO TABS: 100.0000 mg | ORAL_TABLET | Freq: Every evening | ORAL | Status: DC | PRN

## 2016-05-26 MED ORDER — ESCITALOPRAM OXALATE 10 MG PO TABS
20.0000 mg | ORAL_TABLET | Freq: Every day | ORAL | Status: DC
  Administered 2016-05-26: 20 mg via ORAL

## 2016-05-26 MED ORDER — ATORVASTATIN CALCIUM 20 MG PO TABS: 80.0000 mg | ORAL_TABLET | Freq: Every day | ORAL | Status: DC

## 2016-05-26 MED ORDER — ALUM & MAG HYDROXIDE-SIMETH 200-200-20 MG/5ML PO SUSP: 30.0000 mL | ORAL | Status: DC | PRN

## 2016-05-26 MED ORDER — DIVALPROEX SODIUM 500 MG PO DR TAB
500.0000 mg | DELAYED_RELEASE_TABLET | Freq: Two times a day (BID) | ORAL | Status: DC
  Administered 2016-05-26: 500 mg via ORAL

## 2016-05-26 MED ORDER — LISINOPRIL 5 MG PO TABS
5.0000 mg | ORAL_TABLET | Freq: Every day | ORAL | Status: DC
  Administered 2016-05-26: 5 mg via ORAL

## 2016-05-26 MED ORDER — DIVALPROEX SODIUM 500 MG PO DR TAB
DELAYED_RELEASE_TABLET | ORAL | Status: AC
  Filled 2016-05-26: qty 1

## 2016-05-26 MED ORDER — ACETAMINOPHEN 325 MG PO TABS
650.0000 mg | ORAL_TABLET | Freq: Four times a day (QID) | ORAL | Status: DC | PRN
  Administered 2016-05-29: 650 mg via ORAL
  Filled 2016-05-26: qty 2

## 2016-05-26 MED ORDER — ASPIRIN EC 81 MG PO TBEC
81.0000 mg | DELAYED_RELEASE_TABLET | Freq: Every day | ORAL | Status: DC
  Administered 2016-05-26: 81 mg via ORAL

## 2016-05-26 MED ORDER — QUETIAPINE FUMARATE 25 MG PO TABS
100.0000 mg | ORAL_TABLET | Freq: Every day | ORAL | Status: DC
Start: 2016-05-26 — End: 2016-05-26

## 2016-05-26 MED ORDER — PANTOPRAZOLE SODIUM 40 MG PO TBEC
40.0000 mg | DELAYED_RELEASE_TABLET | Freq: Every day | ORAL | Status: DC
  Administered 2016-05-26: 40 mg via ORAL

## 2016-05-26 MED ORDER — MIRTAZAPINE 15 MG PO TABS: 30.0000 mg | ORAL_TABLET | Freq: Every day | ORAL | Status: DC

## 2016-05-26 NOTE — Tx Team (Signed)
Initial Treatment Plan 05/26/2016 10:46 PM Michael Schmitt LGK:[id number]    PATIENT STRESSORS: Medication change or noncompliance   PATIENT STRENGTHS: Capable of independent living Communication skills General fund of knowledge   PATIENT IDENTIFIED PROBLEMS:   Substance Abusse  Depression  Anxiety    " My head feels foggy"  "My medicines aren't right"         DISCHARGE CRITERIA:  Improved stabilization in mood, thinking, and/or behavior  PRELIMINARY DISCHARGE PLAN: Outpatient therapy Return to previous living arrangement  PATIENT/FAMILY INVOLVEMENT: This treatment plan has been presented to and reviewed with the patient, Michael Schmitt, and/or family member,.The patient and family have been given the opportunity to ask questions and make suggestions.  Amie [place], RN 05/26/2016, 10:46 PM

## 2016-05-26 NOTE — ED Notes (Signed)
Pt observed lying in bed    Pt visualized with NAD  No verbalized needs or concerns at this time  He is to be admitted to [facility]  Pt verbalizes agreement with the plan   Continue to monitor

## 2016-05-26 NOTE — ED Notes (Signed)
meds administered as ordered   
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
## 2016-05-26 NOTE — ED Notes (Signed)

## 2016-05-26 NOTE — ED Notes (Signed)
BEHAVIORAL HEALTH ROUNDING Patient sleeping: No. Patient alert and oriented: yes Behavior appropriate: Yes.  ; If no, describe:  Nutrition and fluids offered: yes Toileting and hygiene offered: Yes  Sitter present: q15 minute observations and security  monitoring Law enforcement present: Yes  ODS  

## 2016-05-26 NOTE — Progress Notes (Signed)
LCSW met with patient to provided 1-1 emotional support. He reports he needs real help and just feels hopeless. He is followed by Dr Sui/CBC in the community and can return home. Patient reports he is very anxious with upcoming court cases for DUI- This worker collected data to complete BMU assessment will provide handoff rough draft for LCSW  BMU- Weekend   [employer] LCSW [phone]

## 2016-05-26 NOTE — ED Triage Notes (Addendum)
Pt states that he is hopeless, that he wants to be here for his kids and does not know if he will. States he is not eating, sleeping and has not taken a shower in 3 months. Also concerned that having 2 DWI's will mean jail time.
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
## 2016-05-26 NOTE — Progress Notes (Signed)
LCSW was consulted by Dr Weber Cooks and this patient will be admitted to [facility]. Will consult with [place] LCSW [phone]

## 2016-05-26 NOTE — ED Provider Notes (Addendum)
[facility] Emergency Department Provider Note ____________________________________________   I have reviewed the triage vital signs and the triage nursing note.  HISTORY  Chief Complaint Psychiatric Evaluation   Historian Patient  HPI Michael Schmitt is a 46 y.o. male with a history of anxiety and depression, states he lives at home with his mother and 2 children, has a history of alcohol abuse with 2 DWIs last year, but is currently not drinking, presents stating that he still has extremely depressed mood, and trouble sleeping as well as anxiety. He states he hasn't slept in several nights. He actually was seen in the emergency department on Wednesdayand saw [name] who added Seroquel for "sleep" per the patient. Patient states he still feels jittery and anxious. He states he is "reaching out for help. "States that he has occasionally vague suicidal thoughts, about not wanting to be alive, but no plan. He states when it came down to it, he "would never be able to do it" because he has 2 children he has 2 take care of.  Past Medical History:  Diagnosis Date  . Anxiety   . Anxiety   . Arthritis    cerv. stenosis, spondylosis, HNP- lower back , has been followed in pain clinic, has  had injection s in cerv. area  . Blood dyscrasia    told that when he was young he was a" free bleeder"  . CAD (coronary artery disease)   . Cervical spondylosis without myelopathy 07/24/2014  . Cervicogenic headache 07/24/2014  . Chronic kidney disease    renal calculi- passed spontaneously  . Depression   . Diabetes mellitus without complication ([place])   . Fatty liver   . GERD (gastroesophageal reflux disease)   . Headache(784.0)   . Hyperlipidemia   . Hypertension   . Mental disorder   . MI, old   . RLS (restless legs syndrome)    detected on sleep study  . Shortness of breath   . Ventricular fibrillation ([place]) 04/05/2016    Patient Active Problem List   Diagnosis  Date Noted  . Alcohol abuse 05/12/2016  . Substance induced mood disorder ([place]) 05/12/2016  . Benzodiazepine dependence ([place]) 05/12/2016  . Ventricular fibrillation ([place]) 04/05/2016  . Coronary stent thrombosis   . Generalized anxiety disorder 08/11/2015  . Major depressive disorder, recurrent episode, moderate with anxious distress ([place]) 06/21/2015  . NSTEMI (non-ST elevated myocardial infarction) ([place]) 06/20/2015  . Acute ST elevation myocardial infarction (STEMI) involving left anterior descending (LAD) coronary artery ([place]) 01/01/2015  . Acute MI, inferoposterior wall ([place]) 01/01/2015  . Common bile duct dilation 10/06/2014  . Right lower quadrant abdominal pain 10/06/2014  . Loss of weight 10/06/2014  . Cervicogenic headache 07/24/2014  . Cervical spondylosis without myelopathy 07/24/2014  . Clinical depression 04/15/2013  . Acid reflux 04/15/2013  . BP (high blood pressure) 04/15/2013  . Encounter for other preprocedural examination 04/15/2013  . Nasal obstruction 02/12/2013  . Inflamed nasal mucosa 02/12/2013  . Sinus infection 02/12/2013  . Gastric catarrh 03/01/2011  . Allergic rhinitis 10/20/2010  . Airway hyperreactivity 10/20/2010  . Anxiety, generalized 10/20/2010  . Cervical pain 10/20/2010  . Current tobacco use 10/20/2010    Past Surgical History:  Procedure Laterality Date  . ANTERIOR CERVICAL DECOMP/DISCECTOMY FUSION  11/13/2011   Procedure: ANTERIOR CERVICAL DECOMPRESSION/DISCECTOMY FUSION 1 LEVEL;  Surgeon: [name], MD;  Location: [place] [facility];  Service: Neurosurgery;  Laterality: N/A;  Anterior Cervical Decompression/discectomy Fusion. Cervical three-four.  Marland Kitchen  CARDIAC CATHETERIZATION N/A 01/01/2015   Procedure: Left Heart Cath and Coronary Angiography;  Surgeon: [name], MD;  Location: [place] [facility];  Service: Cardiovascular;  Laterality: N/A;  . CARDIAC CATHETERIZATION N/A 04/05/2016   Procedure: Left Heart Cath and Coronary Angiography;   Surgeon: Belva Crome, MD;  Location: [place] [facility];  Service: Cardiovascular;  Laterality: N/A;  . CARDIAC CATHETERIZATION N/A 04/05/2016   Procedure: Coronary Stent Intervention;  Surgeon: Belva Crome, MD;  Location: [place] [facility];  Service: Cardiovascular;  Laterality: N/A;  . CARDIAC CATHETERIZATION N/A 04/05/2016   Procedure: Intravascular Ultrasound/IVUS;  Surgeon: Belva Crome, MD;  Location: [place] [facility];  Service: Cardiovascular;  Laterality: N/A;  . NASAL SINUS SURGERY     2005    Prior to Admission medications   Medication Sig Start Date End Date Taking? Authorizing Provider  aspirin EC 81 MG EC tablet Take 1 tablet (81 mg total) by mouth daily. Patient not taking: Reported on 04/12/2016 04/08/16   Brittainy M Rosita Fire, PA-C  atorvastatin (LIPITOR) 80 MG tablet Take 1 tablet (80 mg total) by mouth daily at 6 PM. 04/07/16   Brittainy M Rosita Fire, PA-C  carvedilol (COREG) 6.25 MG tablet Take 1 tablet (6.25 mg total) by mouth 2 (two) times daily with a meal. 04/07/16   Brittainy Erie Noe, PA-C  chlordiazePOXIDE (LIBRIUM) 25 MG capsule 50 mg 3 times a day x 3 days 50 mg 2 times a day for 3 days 50 mg once a day for 3 days stop 05/14/16   Rudene Re, MD  clonazePAM (KLONOPIN) 1 MG tablet Take 1 tablet (1 mg total) by mouth 3 (three) times daily as needed for anxiety. Patient not taking: Reported on 05/12/2016 03/26/16 04/05/16  Delman Kitten, MD  dexlansoprazole (DEXILANT) 60 MG capsule Take 60 mg by mouth daily.    Historical Provider, MD  divalproex (DEPAKOTE ER) 500 MG 24 hr tablet Take 500 mg by mouth 2 (two) times daily.    Historical Provider, MD  escitalopram (LEXAPRO) 20 MG tablet Take 20 mg by mouth 2 (two) times daily.    Historical Provider, MD  FLUoxetine (PROZAC) 20 MG capsule Take 1 capsule (20 mg total) by mouth at bedtime. Patient not taking: Reported on 05/14/2016 03/26/16 05/14/16  Delman Kitten, MD  lisinopril (PRINIVIL,ZESTRIL) 5 MG tablet Take 1 tablet (5  mg total) by mouth daily. 06/21/15   Bettey Costa, MD  metoprolol tartrate (LOPRESSOR) 25 MG tablet Take 25 mg by mouth 2 (two) times daily.    Historical Provider, MD  mirtazapine (REMERON) 30 MG tablet Take 60 mg by mouth at bedtime.  03/07/16   Historical Provider, MD  nicotine (NICODERM CQ - DOSED IN MG/24 HOURS) 21 mg/24hr patch Place 1 patch (21 mg total) onto the skin daily. Patient not taking: Reported on 04/12/2016 04/08/16   Brittainy M Rosita Fire, PA-C  nitroGLYCERIN (NITROSTAT) 0.4 MG SL tablet Place 1 tablet (0.4 mg total) under the tongue every 5 (five) minutes x 3 doses as needed for chest pain. 04/07/16   Brittainy M Simmons, PA-C  OLANZapine (ZYPREXA) 5 MG tablet Take 1 tablet (5 mg total) by mouth at bedtime. Patient not taking: Reported on 05/11/2016 03/26/16 04/02/16  Delman Kitten, MD  QUEtiapine (SEROQUEL) 100 MG tablet Take 1 tablet (100 mg total) by mouth at bedtime. 05/24/16   Gonzella Lex, MD  ticagrelor (BRILINTA) 90 MG TABS tablet Take 1 tablet (90 mg total) by mouth 2 (two) times  daily. 04/07/16   Brittainy Erie Noe, PA-C    Allergies  Allergen Reactions  . Prednisone Other (See Comments)    Hypertension, makes him feel spacey  . Hydrocodone Other (See Comments)    Headache, irritable  . Varenicline Other (See Comments)    Suicidal thoughts    Family History  Problem Relation Age of Onset  . Prostate cancer Father   . Hypertension Mother   . Kidney Stones Mother   . Anxiety disorder Mother   . Depression Mother   . COPD Sister   . Hypertension Sister   . Diabetes Sister   . Depression Sister   . Anxiety disorder Sister   . Seizures Sister     Social History Social History  Substance Use Topics  . Smoking status: Current Every Day Smoker    Packs/day: 2.00    Years: 17.00    Types: Cigarettes, Cigars  . Smokeless tobacco: Former Systems developer     Comment: occassional snuff   . Alcohol use 0.0 oz/week     Comment: trying not to drink     Review of  Systems  Constitutional: Negative for fever. Eyes: Negative for visual changes. ENT: Negative for sore throat. Cardiovascular: Negative for chest pain. Respiratory: Negative for shortness of breath. Gastrointestinal: Negative for abdominal pain, vomiting and diarrhea. Genitourinary: Negative for dysuria. Musculoskeletal: Negative for back pain. Skin: Negative for rash. Neurological: Negative for headache. 10 point Review of Systems otherwise negative ____________________________________________   PHYSICAL EXAM:  VITAL SIGNS: ED Triage Vitals  Enc Vitals Group     BP 05/26/16 1240 (!) 149/103     Pulse Rate 05/26/16 1240 89     Resp --      Temp 05/26/16 1240 98.7 F (37.1 C)     Temp Source 05/26/16 1240 Oral     SpO2 05/26/16 1240 96 %     Weight 05/26/16 1240 194 lb (88 kg)     Height 05/26/16 1240 6\' 2"  (1.88 m)     Head Circumference --      Peak Flow --      Pain Score 05/26/16 1241 10     Pain Loc --      Pain Edu? --      Excl. in [place]? --      Constitutional: Alert and oriented. Well appearing and in no distress.  Somewhat anxious. HEENT   Head: Normocephalic and atraumatic.      Eyes: Conjunctivae are normal. PERRL. Normal extraocular movements.      Ears:         Nose: No congestion/rhinnorhea.   Mouth/Throat: Mucous membranes are moist.   Neck: No stridor. Cardiovascular/Chest: Normal rate, regular rhythm.  No murmurs, rubs, or gallops. Respiratory: Normal respiratory effort without tachypnea nor retractions. Breath sounds are clear and equal bilaterally. No wheezes/rales/rhonchi. Gastrointestinal: Soft. No distention, no guarding, no rebound. Nontender.    Genitourinary/rectal:Deferred Musculoskeletal: Nontender with normal range of motion in all extremities. No joint effusions.  No lower extremity tenderness.  No edema. Neurologic:  Normal speech and language. No gross or focal neurologic deficits are appreciated. Skin:  Skin is warm, dry and  intact. No rash noted. Psychiatric: Depressed mood and flat affect. No agitation or aggression. Speech and behavior are normal. Patient exhibits appropriate insight and judgment.  Initially endorses vague suicidal ideation without any specific plan, actually states he would never do anything like that.   ____________________________________________  LABS (pertinent positives/negatives)  Labs Reviewed - No data to  display  ____________________________________________    EKG I, Lisa Roca, MD, the attending physician have personally viewed and interpreted all ECGs.  None  ____________________________________________  RADIOLOGY All Xrays were viewed by me. Imaging interpreted by Radiologist.  None __________________________________________  PROCEDURES  Procedure(s) performed: None  Critical Care performed: None  ____________________________________________   ED COURSE / ASSESSMENT AND PLAN  Pertinent labs & imaging results that were available during my care of the patient were reviewed by me and considered in my medical decision making (see chart for details).   Mr. Word is back for behavioral health evaluation given ongoing depressed mood and trouble with sleep, endorsing hopelessness. When asked about suicidal thoughts, he initially states that yes he is had some vague thoughts, but then quickly states he would never be able to do anything like that. He is voluntary here for help. He saw [name] on Wednesday. I will have TTS and [name] see him.  Denies any new or ongoing medical issues, I don't think any labs are indicated today.  I spoke with [name] who intends to admit this patient here [place]    CONSULTATIONS: Psychiatry and TTS.   Patient / Family / Caregiver informed of clinical course, medical decision-making process, and agree with plan.    ___________________________________________   FINAL CLINICAL IMPRESSION(S) / ED  DIAGNOSES   Final diagnoses:  Depression, unspecified depression type              Note: This dictation was prepared with Dragon dictation. Any transcriptional errors that result from this process are unintentional    Lisa Roca, MD 05/26/16 [place], MD 05/26/16 1435

## 2016-05-26 NOTE — Consult Note (Signed)
[facility] Face-to-Face Psychiatry Consult   Reason for Consult:  Consult for 46 year old man well known to the psychiatric service with a history of recurrent severe depression and anxiety and substance abuse who presents once again for one of many presentations to the emergency room in the last week or so. Referring Physician:  Reita Cliche Patient Identification: Michael Schmitt MRN:  [id number] Principal Diagnosis: Major depressive disorder, recurrent episode, moderate with anxious distress ([place]) Diagnosis:   Patient Active Problem List   Diagnosis Date Noted  . Alcohol abuse [F10.10] 05/12/2016  . Substance induced mood disorder ([place]) [F19.94] 05/12/2016  . Benzodiazepine dependence ([place]) [F13.20] 05/12/2016  . Ventricular fibrillation ([place]) [I49.01] 04/05/2016  . 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 Generalized anxiety disorder [F41.1] 08/11/2015  . Major depressive disorder, recurrent episode, moderate with anxious distress ([place]) [F33.1] 06/21/2015  . NSTEMI (non-ST elevated myocardial infarction) ([place]) [I21.4] 06/20/2015  . Acute ST elevation myocardial infarction (STEMI) involving left anterior descending (LAD) coronary artery ([place]) [I21.02] 01/01/2015  . Acute MI, inferoposterior wall ([place]) [I21.19] 01/01/2015  . Common bile duct dilation [K83.8] 10/06/2014  . Right lower quadrant abdominal pain [R10.31] 10/06/2014  . Loss of weight [R63.4] 10/06/2014  . Cervicogenic headache [R51] 07/24/2014  . Cervical spondylosis without myelopathy [M47.812] 07/24/2014  . Clinical depression [F32.9] 04/15/2013  . Acid reflux [K21.9] 04/15/2013  . BP (high blood pressure) [I10] 04/15/2013  . Encounter for other preprocedural examination [Y07.371] 04/15/2013  . Nasal obstruction [J34.89] 02/12/2013  . Inflamed nasal mucosa [J31.0] 02/12/2013  . Sinus infection [J32.9] 02/12/2013  . Gastric catarrh [K29.70] 03/01/2011  . Allergic rhinitis [J30.9] 10/20/2010  . Airway hyperreactivity [J45.909]  10/20/2010  . Anxiety, generalized [F41.1] 10/20/2010  . Cervical pain [M54.2] 10/20/2010  . Current tobacco use [Z72.0] 10/20/2010    Total Time spent with patient: 1 hour  Subjective:   Michael Schmitt is a 46 y.o. male patient admitted with "I just can't stand it, I just don't know what to do".  HPI:  Patient interviewed. Chart reviewed. This a 46 year old man with chronic depression and anxiety. He has been to the emergency room several times in the last week or so and was here just 2 days ago. He returns with essentially exactly the same complaints. He complains of feeling depressed sad and hopeless all the time. Says his anxiety is enormous and he feels shaky and nervous constantly. He claims that he is not able to sleep whatsoever despite adding 100 mg of Seroquel at night to his medicine. Patient says he is having suicidal thoughts and feels hopeless although he admits that he is not likely to act on any suicidality because of his love for his children. Denies having any psychotic symptoms. He denies that he is been using any benzodiazepines, abusing any drugs or drinking any alcohol. He says that he has made an appointment to see his outpatient provider but will not be time for it for over 2 more weeks.  Social history: He lives at home with his mother and his 2 young children. Patient is disabled due to chronic pain in his neck.  Medical history: Complaints of chronic pain, acid reflex  Substance abuse history: Patient has a history of alcohol abuse and a history of benzodiazepine dependence. Has been on high doses of benzodiazepines with excessive use as well as a history of alcohol use in the past. Has a history of presentation frequently to the hospital asking for more sedative medicines. He is clearly misrepresenting his current  sedative use as he insists that he has not been taking any benzodiazepines but always has a positive in his drug screen  Past Psychiatric History: Patient  has a history of depression. Has been prescribed multiple medicines with partial to no improvement. He has recently been noncompliant with his outpatient provider. Chronic anxiety symptoms. He has been offered ECT in the past but did not follow through with full course. Unclear if it would be of any benefit to him. No history of suicide attempts or violence  Risk to Self: Is patient at risk for suicide?: No Risk to Others:   Prior Inpatient Therapy:   Prior Outpatient Therapy:    Past Medical History:  Past Medical History:  Diagnosis Date  . Anxiety   . Anxiety   . Arthritis    cerv. stenosis, spondylosis, HNP- lower back , has been followed in pain clinic, has  had injection s in cerv. area  . Blood dyscrasia    told that when he was young he was a" free bleeder"  . CAD (coronary artery disease)   . Cervical spondylosis without myelopathy 07/24/2014  . Cervicogenic headache 07/24/2014  . Chronic kidney disease    renal calculi- passed spontaneously  . Depression   . Diabetes mellitus without complication ([place])   . Fatty liver   . GERD (gastroesophageal reflux disease)   . Headache(784.0)   . Hyperlipidemia   . Hypertension   . Mental disorder   . MI, old   . RLS (restless legs syndrome)    detected on sleep study  . Shortness of breath   . Ventricular fibrillation ([place]) 04/05/2016    Past Surgical History:  Procedure Laterality Date  . ANTERIOR CERVICAL DECOMP/DISCECTOMY FUSION  11/13/2011   Procedure: ANTERIOR CERVICAL DECOMPRESSION/DISCECTOMY FUSION 1 LEVEL;  Surgeon: [name], MD;  Location: [facility];  Service: Neurosurgery;  Laterality: N/A;  Anterior Cervical Decompression/discectomy Fusion. Cervical three-four.  Marland Kitchen CARDIAC CATHETERIZATION N/A 01/01/2015   Procedure: Left Heart Cath and Coronary Angiography;  Surgeon: [name], MD;  Location: [place];  Service: Cardiovascular;  Laterality: N/A;  . CARDIAC CATHETERIZATION N/A 04/05/2016   Procedure:  Left Heart Cath and Coronary Angiography;  Surgeon: Belva Crome, MD;  Location: [facility];  Service: Cardiovascular;  Laterality: N/A;  . CARDIAC CATHETERIZATION N/A 04/05/2016   Procedure: Coronary Stent Intervention;  Surgeon: Belva Crome, MD;  Location: [place] [facility];  Service: Cardiovascular;  Laterality: N/A;  . CARDIAC CATHETERIZATION N/A 04/05/2016   Procedure: Intravascular Ultrasound/IVUS;  Surgeon: Belva Crome, MD;  Location: [facility];  Service: Cardiovascular;  Laterality: N/A;  . NASAL SINUS SURGERY     2005   Family History:  Family History  Problem Relation Age of Onset  . Prostate cancer Father   . Hypertension Mother   . Kidney Stones Mother   . Anxiety disorder Mother   . Depression Mother   . COPD Sister   . Hypertension Sister   . Diabetes Sister   . Depression Sister   . Anxiety disorder Sister   . Seizures Sister    Family Psychiatric  History: Positive for anxiety Social History:  History  Alcohol Use  . 0.0 oz/week    Comment: trying not to drink      History  Drug Use No    Social History   Social History  . Marital status: Widowed    Spouse name: N/A  . Number of  children: 3  . Years of education: 12   Occupational History  .      unemployed   Social History Main Topics  . Smoking status: Current Every Day Smoker    Packs/day: 2.00    Years: 17.00    Types: Cigarettes, Cigars  . Smokeless tobacco: Former Systems developer     Comment: occassional snuff   . Alcohol use 0.0 oz/week     Comment: trying not to drink   . Drug use: No  . Sexual activity: Not Currently   Other Topics Concern  . None   Social History Narrative   Patient is right handed.   Patient drinks 2 sodas daily.   Additional Social History:    Allergies:   Allergies  Allergen Reactions  . Prednisone Other (See Comments)    Hypertension, makes him feel spacey  . Hydrocodone Other (See Comments)    Headache, irritable  . Varenicline Other  (See Comments)    Suicidal thoughts    Labs: No results found for this or any previous visit (from the past 48 hour(s)).  No current facility-administered medications for this encounter.    Current Outpatient Prescriptions  Medication Sig Dispense Refill  . aspirin EC 81 MG EC tablet Take 1 tablet (81 mg total) by mouth daily. (Patient not taking: Reported on 04/12/2016)    . atorvastatin (LIPITOR) 80 MG tablet Take 1 tablet (80 mg total) by mouth daily at 6 PM. 30 tablet 5  . carvedilol (COREG) 6.25 MG tablet Take 1 tablet (6.25 mg total) by mouth 2 (two) times daily with a meal. 60 tablet 5  . chlordiazePOXIDE (LIBRIUM) 25 MG capsule 50 mg 3 times a day x 3 days 50 mg 2 times a day for 3 days 50 mg once a day for 3 days stop 36 capsule 0  . clonazePAM (KLONOPIN) 1 MG tablet Take 1 tablet (1 mg total) by mouth 3 (three) times daily as needed for anxiety. (Patient not taking: Reported on 05/12/2016) 21 tablet 0  . dexlansoprazole (DEXILANT) 60 MG capsule Take 60 mg by mouth daily.    . divalproex (DEPAKOTE ER) 500 MG 24 hr tablet Take 500 mg by mouth 2 (two) times daily.    Marland Kitchen escitalopram (LEXAPRO) 20 MG tablet Take 20 mg by mouth 2 (two) times daily.    Marland Kitchen FLUoxetine (PROZAC) 20 MG capsule Take 1 capsule (20 mg total) by mouth at bedtime. (Patient not taking: Reported on 05/14/2016) 7 capsule 0  . lisinopril (PRINIVIL,ZESTRIL) 5 MG tablet Take 1 tablet (5 mg total) by mouth daily. 30 tablet 0  . metoprolol tartrate (LOPRESSOR) 25 MG tablet Take 25 mg by mouth 2 (two) times daily.    . mirtazapine (REMERON) 30 MG tablet Take 60 mg by mouth at bedtime.   4  . nicotine (NICODERM CQ - DOSED IN MG/24 HOURS) 21 mg/24hr patch Place 1 patch (21 mg total) onto the skin daily. (Patient not taking: Reported on 04/12/2016) 28 patch 0  . nitroGLYCERIN (NITROSTAT) 0.4 MG SL tablet Place 1 tablet (0.4 mg total) under the tongue every 5 (five) minutes x 3 doses as needed for chest pain. 25 tablet 2  .  OLANZapine (ZYPREXA) 5 MG tablet Take 1 tablet (5 mg total) by mouth at bedtime. (Patient not taking: Reported on 05/11/2016) 7 tablet 0  . QUEtiapine (SEROQUEL) 100 MG tablet Take 1 tablet (100 mg total) by mouth at bedtime. 30 tablet 0  . ticagrelor (BRILINTA) 90 MG TABS  tablet Take 1 tablet (90 mg total) by mouth 2 (two) times daily. 60 tablet 10    Musculoskeletal: Strength & Muscle Tone: within normal limits Gait & Station: normal Patient leans: N/A  Psychiatric Specialty Exam: Physical Exam  Nursing note and vitals reviewed. Constitutional: He appears well-developed and well-nourished.  HENT:  Head: Normocephalic and atraumatic.  Eyes: Conjunctivae are normal. Pupils are equal, round, and reactive to light.  Neck: Normal range of motion.  Cardiovascular: Regular rhythm and normal heart sounds.   Respiratory: Effort normal. No respiratory distress.  GI: Soft.  Musculoskeletal: Normal range of motion.  Neurological: He is alert.  Skin: Skin is warm and dry.  Psychiatric: His mood appears anxious. His speech is delayed. He is slowed and withdrawn. Cognition and memory are normal. He expresses impulsivity and inappropriate judgment. He exhibits a depressed mood. He expresses suicidal ideation. He expresses no suicidal plans.    Review of Systems  Constitutional: Negative.   HENT: Negative.   Eyes: Negative.   Respiratory: Negative.   Cardiovascular: Negative.   Gastrointestinal: Negative.   Musculoskeletal: Negative.   Skin: Negative.   Neurological: Negative.   Psychiatric/Behavioral: Positive for depression and suicidal ideas. Negative for hallucinations, memory loss and substance abuse. The patient is nervous/anxious and has insomnia.     Blood pressure (!) 149/103, pulse 89, temperature 98.7 F (37.1 C), temperature source Oral, height 6\' 2"  (1.88 m), weight 88 kg (194 lb), SpO2 96 %.Body mass index is 24.91 kg/m.  General Appearance: Casual  Eye Contact:  Fair   Speech:  Slow  Volume:  Decreased  Mood:  Anxious and Depressed  Affect:  Congruent and Tearful  Thought Process:  Goal Directed  Orientation:  Full (Time, Place, and Person)  Thought Content:  Rumination  Suicidal Thoughts:  Yes.  without intent/plan  Homicidal Thoughts:  No  Memory:  Immediate;   Good Recent;   Fair Remote;   Fair  Judgement:  Impaired  Insight:  Lacking  Psychomotor Activity:  Decreased  Concentration:  Concentration: Fair  Recall:  [employer] of Knowledge:  Fair  Language:  Good  Akathisia:  No  Handed:  Right  AIMS (if indicated):     Assets:  Desire for Improvement Housing Physical Health  ADL's:  Intact  Cognition:  WNL  Sleep:        Treatment Plan Summary: Daily contact with patient to assess and evaluate symptoms and progress in treatment, Medication management and Plan 46 year old man presents to the hospital with complaints of anxiety and depression. He has been to the emergency room multiple times in the last couple weeks. Always with the same complaints. Supportive counseling and even addition of medicine and discharge is apparently not doing anything to address his neediness for hospital level care. It has been made clear to the patient that I would not support giving him any benzodiazepines at all but that if he feels that it's a matter of safety we can admit him to the hospital. Patient is rather passive aggressive and dependent about all of this. Given the need to break this cycle of repeated hospitalizations I am going to recommend admission to the psychiatry unit. Continue outpatient medicines but with no benzodiazepines. Patient will be on 15 minute checks. Labs will be reviewed. He can be engaged in regular group and individual counseling.  Disposition: Recommend psychiatric Inpatient admission when medically cleared. Supportive therapy provided about ongoing stressors.  Alethia Berthold, MD 05/26/2016 3:34 PM

## 2016-05-26 NOTE — BHH Counselor (Signed)
Per [name], pt meets criteria for admission to  [facility] BMU.  Case Reviewed with [facility] Charge Nurse Cyril Mourning.  Pt has been admitted and assigned to Bed 324-A. Attending MD: Dr. Bary Leriche.

## 2016-05-27 DIAGNOSIS — Z818 Family history of other mental and behavioral disorders: Secondary | ICD-10-CM

## 2016-05-27 LAB — LIPID PANEL
CHOL/HDL RATIO: 6.8 ratio
Cholesterol: 217 mg/dL — ABNORMAL HIGH (ref 0–200)
HDL: 32 mg/dL — AB (ref >40)
LDL CALC: 118 mg/dL — AB (ref 0–99)
TRIGLYCERIDES: 334 mg/dL — AB (ref <150)
VLDL: 67 mg/dL — AB (ref 0–40)

## 2016-05-27 LAB — TSH: TSH: 0.732 u[IU]/mL (ref 0.350–4.500)

## 2016-05-27 MED ORDER — SERTRALINE HCL 25 MG PO TABS
25.0000 mg | ORAL_TABLET | Freq: Every day | ORAL | Status: DC
  Administered 2016-05-27 – 2016-05-28 (×2): 25 mg via ORAL
  Filled 2016-05-27 (×2): qty 1

## 2016-05-27 MED ORDER — HYDROXYZINE HCL 25 MG PO TABS
25.0000 mg | ORAL_TABLET | Freq: Three times a day (TID) | ORAL | Status: DC | PRN
  Administered 2016-05-27 – 2016-06-01 (×10): 25 mg via ORAL
  Filled 2016-05-27 (×10): qty 1

## 2016-05-27 NOTE — Plan of Care (Signed)
Problem: Safety: Goal: Periods of time without injury will increase Outcome: Progressing Pt remained free from injury this shift.

## 2016-05-27 NOTE — BHH Suicide Risk Assessment (Signed)
[facility] Admission Suicide Risk Assessment   Nursing information obtained from:    Demographic factors:   46 yo male with depression  Current Mental Status:   depressed, anxious, denies SI/AH/VH, limited insight and judgement Loss Factors:   multiple Historical Factors:   substance dependence Risk Reduction Factors:   unknown  Total Time spent with patient: 45 minutes Principal Problem: <principal problem not specified> Diagnosis:   Patient Active Problem List   Diagnosis Date Noted  . Severe recurrent major depression without psychotic features ([place]) [F33.2] 05/26/2016  . Alcohol abuse [F10.10] 05/12/2016  . Substance induced mood disorder ([place]) [F19.94] 05/12/2016  . Benzodiazepine dependence ([place]) [F13.20] 05/12/2016  . Ventricular fibrillation ([place]) [I49.01] 04/05/2016  . 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 Generalized anxiety disorder [F41.1] 08/11/2015  . Major depressive disorder, recurrent episode, moderate with anxious distress ([place]) [F33.1] 06/21/2015  . NSTEMI (non-ST elevated myocardial infarction) ([place]) [I21.4] 06/20/2015  . Acute ST elevation myocardial infarction (STEMI) involving left anterior descending (LAD) coronary artery ([place]) [I21.02] 01/01/2015  . Acute MI, inferoposterior wall ([place]) [I21.19] 01/01/2015  . Common bile duct dilation [K83.8] 10/06/2014  . Right lower quadrant abdominal pain [R10.31] 10/06/2014  . Loss of weight [R63.4] 10/06/2014  . Cervicogenic headache [R51] 07/24/2014  . Cervical spondylosis without myelopathy [M47.812] 07/24/2014  . Clinical depression [F32.9] 04/15/2013  . Acid reflux [K21.9] 04/15/2013  . BP (high blood pressure) [I10] 04/15/2013  . Encounter for other preprocedural examination [X21.194] 04/15/2013  . Nasal obstruction [J34.89] 02/12/2013  . Inflamed nasal mucosa [J31.0] 02/12/2013  . Sinus infection [J32.9] 02/12/2013  . Gastric catarrh [K29.70] 03/01/2011  . Allergic rhinitis [J30.9] 10/20/2010  . Airway  hyperreactivity [J45.909] 10/20/2010  . Anxiety, generalized [F41.1] 10/20/2010  . Cervical pain [M54.2] 10/20/2010  . Current tobacco use [Z72.0] 10/20/2010   Subjective Data:Patient is a 46 yo male with depression and history of benzodiazepine dependence  Continued Clinical Symptoms:  Alcohol Use Disorder Identification Test Final Score (AUDIT): 7 The "Alcohol Use Disorders Identification Test", Guidelines for Use in Primary Care, Second Edition.  World Pharmacologist [facility]). Score between 0-7:  no or low risk or alcohol related problems. Score between 8-15:  moderate risk of alcohol related problems. Score between 16-19:  high risk of alcohol related problems. Score 20 or above:  warrants further diagnostic evaluation for alcohol dependence and treatment.   CLINICAL FACTORS:   Depression:   Hopelessness Alcohol/Substance Abuse/Dependencies   Musculoskeletal: Strength & Muscle Tone: within normal limits Gait & Station: normal Patient leans: N/A  Psychiatric Specialty Exam: Physical Exam  ROS  Blood pressure (!) 148/83, pulse 83, temperature 98.1 F (36.7 C), resp. rate 18, height 6\' 2"  (1.88 m), weight 180 lb (81.6 kg), SpO2 99 %.Body mass index is 23.11 kg/m.  General Appearance: Casual  Eye Contact:  Poor  Speech:  Clear and Coherent  Volume:  Normal  Mood:  Anxious and Dysphoric  Affect:  Blunt and Constricted  Thought Process:  Coherent  Orientation:  Full (Time, Place, and Person)  Thought Content:  Rumination  Suicidal Thoughts:  No  Homicidal Thoughts:  No  Memory:  Immediate;   Fair Recent;   Fair Remote;   Fair  Judgement:  Impaired  Insight:  Lacking  Psychomotor Activity:  Normal  Concentration:  Concentration: Fair and Attention Span: Fair  Recall:  [employer] of Knowledge:  Poor  Language:  Fair  Akathisia:  No  Handed:  Right  AIMS (if indicated):  Assets:  Communication Skills  ADL's:  Intact  Cognition:  WNL  Sleep:  Number of Hours:  4.45    Treatment Plan Summary: Daily contact with patient to assess and evaluate symptoms and progress in treatment and Medication management  Observation Level/Precautions:  15 minute checks  Laboratory:  Lipid panel with elevated cholesterol,ldl, triglycerides, HgA1C 5.9, TSH is normal  Psychotherapy:  Patient to engage in therapy and develop coping skills to address his substance dependence and depression.  Medications:  None except for prn meds  Consultations:  As needed  Discharge Concerns:  Follow up plan for outpatient care and stabilization  Estimated LOS:3-4 days  Other:     Physician Treatment Plan for Primary Diagnosis: MDD, without psychotic symptoms  Start Zoloft at 25mg  po qd.  Long Term Goal(s): Improvement in symptoms so as ready for discharge  Short Term Goals: Ability to identify changes in lifestyle to reduce recurrence of condition will improve, Ability to verbalize feelings will improve, Ability to disclose and discuss suicidal ideas, Ability to demonstrate self-control will improve, Ability to identify and develop effective coping behaviors will improve, Ability to maintain clinical measurements within normal limits will improve and Compliance with prescribed medications will improve  Physician Treatment Plan for Secondary Diagnosis: Active Problems:   Severe recurrent major depression without psychotic features ([place])  Long Term Goal(s): Improvement in symptoms so as ready for discharge  Short Term Goals: Ability to identify changes in lifestyle to reduce recurrence of condition will improve, Ability to verbalize feelings will improve, Ability to disclose and discuss suicidal ideas, Ability to demonstrate self-control will improve, Ability to identify and develop effective coping behaviors will improve, Ability to maintain clinical measurements within normal limits will improve and Compliance with prescribed medications will improve                                                           COGNITIVE FEATURES THAT CONTRIBUTE TO RISK:  Closed-mindedness, Polarized thinking and Thought constriction (tunnel vision)    SUICIDE RISK:   Mild:  Suicidal ideation of limited frequency, intensity, duration, and specificity.  There are no identifiable plans, no associated intent, mild dysphoria and related symptoms, good self-control (both objective and subjective assessment), few other risk factors, and identifiable protective factors, including available and accessible social support.  PLAN OF CARE: as above  I certify that inpatient services furnished can reasonably be expected to improve the patient's condition.   Elvin So, MD 05/27/2016, 3:40 PM

## 2016-05-27 NOTE — Progress Notes (Signed)
Patient with depressed affect, withdrawn and tired in bed. No meds scheduled and MD is notified and aware. MD into visit.  No SI/HI at this time. Patient states he "does not feel well". VS monitored and recorded. Minimal interaction with peers. Verbalizes needs appropriately with staff. Rests in bed rt malaise. Safety maintained.

## 2016-05-27 NOTE — BHH Counselor (Signed)
CSW attempted to meet with patient to complete psychosocial assessment. Patient stated he was not feeling well and has not been able to sleep. Patient stated "I just can't think straight, I feel so cloudy". Patient stated he does not feel he can participate in the assessment today, CSW informed patient she would meet with his tomorrow to complete assessment.   Keats Kingry G. Claybon Jabs MSW, [facility] 05/27/2016 3:40 PM

## 2016-05-27 NOTE — H&P (Signed)
Psychiatric Admission Assessment Adult  Patient Identification: Michael Schmitt MRN:  [id number] Date of Evaluation:  05/27/2016 Chief Complaint:  Depression Principal Diagnosis: <principal problem not specified> Diagnosis:   Patient Active Problem List   Diagnosis Date Noted  . Severe recurrent major depression without psychotic features ([place]) [F33.2] 05/26/2016  . Alcohol abuse [F10.10] 05/12/2016  . Substance induced mood disorder ([place]) [F19.94] 05/12/2016  . Benzodiazepine dependence ([place]) [F13.20] 05/12/2016  . Ventricular fibrillation ([place]) [I49.01] 04/05/2016  . 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 Generalized anxiety disorder [F41.1] 08/11/2015  . Major depressive disorder, recurrent episode, moderate with anxious distress ([place]) [F33.1] 06/21/2015  . NSTEMI (non-ST elevated myocardial infarction) ([place]) [I21.4] 06/20/2015  . Acute ST elevation myocardial infarction (STEMI) involving left anterior descending (LAD) coronary artery ([place]) [I21.02] 01/01/2015  . Acute MI, inferoposterior wall ([place]) [I21.19] 01/01/2015  . Common bile duct dilation [K83.8] 10/06/2014  . Right lower quadrant abdominal pain [R10.31] 10/06/2014  . Loss of weight [R63.4] 10/06/2014  . Cervicogenic headache [R51] 07/24/2014  . Cervical spondylosis without myelopathy [M47.812] 07/24/2014  . Clinical depression [F32.9] 04/15/2013  . Acid reflux [K21.9] 04/15/2013  . BP (high blood pressure) [I10] 04/15/2013  . Encounter for other preprocedural examination [U31.497] 04/15/2013  . Nasal obstruction [J34.89] 02/12/2013  . Inflamed nasal mucosa [J31.0] 02/12/2013  . Sinus infection [J32.9] 02/12/2013  . Gastric catarrh [K29.70] 03/01/2011  . Allergic rhinitis [J30.9] 10/20/2010  . Airway hyperreactivity [J45.909] 10/20/2010  . Anxiety, generalized [F41.1] 10/20/2010  . Cervical pain [M54.2] 10/20/2010  . Current tobacco use [Z72.0] 10/20/2010   History of Present Illness:Per consult note, " This a  46 year old man with chronic depression and anxiety. He has been to the emergency room several times in the last week or so and was here just 2 days ago. He returns with essentially exactly the same complaints. He complains of feeling depressed sad and hopeless all the time. Says his anxiety is enormous and he feels shaky and nervous constantly. He claims that he is not able to sleep whatsoever despite adding 100 mg of Seroquel at night to his medicine. Patient says he is having suicidal thoughts and feels hopeless although he admits that he is not likely to act on any suicidality because of his love for his children. Denies having any psychotic symptoms. He denies that he is been using any benzodiazepines, abusing any drugs or drinking any alcohol. He says that he has made an appointment to see his outpatient provider but will not be time for it for over 2 more weeks."  Patient this morning very focused on feeling weird, states he feels foggy and clouded in his thinking. Looks comfortable when ambulating on unit. Not forthcoming about his admission, denies any substance abuse, denies suicidal thoughts.    Total Time spent with patient: 1 hour  Past Psychiatric History: Patient has a history of depression. Has been prescribed multiple medicines with partial to no improvement. He has recently been noncompliant with his outpatient provider. Chronic anxiety symptoms. He has been offered ECT in the past but did not follow through with full course. Unclear if it would be of any benefit to him. No history of suicide attempts or violence  Is the patient at risk to self? Yes.    Has the patient been a risk to self in the past 6 months? Yes.    Has the patient been a risk to self within the distant past? Yes.    Is the patient a risk to  others? Yes.    Has the patient been a risk to others in the past 6 months? Yes.    Has the patient been a risk to others within the distant past? No.   Prior Inpatient  Therapy:   Prior Outpatient Therapy:    Alcohol Screening: 1. How often do you have a drink containing alcohol?: 2 to 3 times a week 2. How many drinks containing alcohol do you have on a typical day when you are drinking?: 3 or 4 3. How often do you have six or more drinks on one occasion?: Less than monthly Preliminary Score: 2 4. How often during the last year have you found that you were not able to stop drinking once you had started?: Monthly 5. How often during the last year have you failed to do what was normally expected from you becasue of drinking?: Never 6. How often during the last year have you needed a first drink in the morning to get yourself going after a heavy drinking session?: Never 7. How often during the last year have you had a feeling of guilt of remorse after drinking?: Never 8. How often during the last year have you been unable to remember what happened the night before because you had been drinking?: Never 9. Have you or someone else been injured as a result of your drinking?: No 10. Has a relative or friend or a doctor or another health worker been concerned about your drinking or suggested you cut down?: No Alcohol Use Disorder Identification Test Final Score (AUDIT): 7 Brief Intervention: Patient declined brief intervention Substance Abuse History in the last 12 months:  Yes.    Patient has a history of alcohol abuse and a history of benzodiazepine dependence. Has been on high doses of benzodiazepines with excessive use as well as a history of alcohol use in the past. Has a history of presentation frequently to the hospital asking for more sedative medicines. He is clearly misrepresenting his current sedative use as he insists that he has not been taking any benzodiazepines but always has a positive in his drug screen Consequences of Substance Abuse: NA Previous Psychotropic Medications: Yes  Psychological Evaluations: No  Past Medical History:  Past Medical  History:  Diagnosis Date  . Anxiety   . Anxiety   . Arthritis    cerv. stenosis, spondylosis, HNP- lower back , has been followed in pain clinic, has  had injection s in cerv. area  . Blood dyscrasia    told that when he was young he was a" free bleeder"  . CAD (coronary artery disease)   . Cervical spondylosis without myelopathy 07/24/2014  . Cervicogenic headache 07/24/2014  . Chronic kidney disease    renal calculi- passed spontaneously  . Depression   . Diabetes mellitus without complication ([place])   . Fatty liver   . GERD (gastroesophageal reflux disease)   . Headache(784.0)   . Hyperlipidemia   . Hypertension   . Mental disorder   . MI, old   . RLS (restless legs syndrome)    detected on sleep study  . Shortness of breath   . Ventricular fibrillation ([place]) 04/05/2016    Past Surgical History:  Procedure Laterality Date  . ANTERIOR CERVICAL DECOMP/DISCECTOMY FUSION  11/13/2011   Procedure: ANTERIOR CERVICAL DECOMPRESSION/DISCECTOMY FUSION 1 LEVEL;  Surgeon: [name], MD;  Location: [facility];  Service: Neurosurgery;  Laterality: N/A;  Anterior Cervical Decompression/discectomy Fusion. Cervical three-four.  Marland Kitchen CARDIAC CATHETERIZATION N/A 01/01/2015  Procedure: Left Heart Cath and Coronary Angiography;  Surgeon: [name], MD;  Location: [facility];  Service: Cardiovascular;  Laterality: N/A;  . CARDIAC CATHETERIZATION N/A 04/05/2016   Procedure: Left Heart Cath and Coronary Angiography;  Surgeon: Belva Crome, MD;  Location: [facility];  Service: Cardiovascular;  Laterality: N/A;  . CARDIAC CATHETERIZATION N/A 04/05/2016   Procedure: Coronary Stent Intervention;  Surgeon: Belva Crome, MD;  Location: [facility];  Service: Cardiovascular;  Laterality: N/A;  . CARDIAC CATHETERIZATION N/A 04/05/2016   Procedure: Intravascular Ultrasound/IVUS;  Surgeon: Belva Crome, MD;  Location: [facility];  Service: Cardiovascular;  Laterality: N/A;  .  NASAL SINUS SURGERY     2005   Family History:  Family History  Problem Relation Age of Onset  . Prostate cancer Father   . Hypertension Mother   . Kidney Stones Mother   . Anxiety disorder Mother   . Depression Mother   . COPD Sister   . Hypertension Sister   . Diabetes Sister   . Depression Sister   . Anxiety disorder Sister   . Seizures Sister    Family Psychiatric  History: none Tobacco Screening: Have you used any form of tobacco in the last 30 days? (Cigarettes, Smokeless Tobacco, Cigars, and/or Pipes): Yes Tobacco use, Select all that apply: 5 or more cigarettes per day Are you interested in Tobacco Cessation Medications?: No, patient refused Counseled patient on smoking cessation including recognizing danger situations, developing coping skills and basic information about quitting provided: Refused/Declined practical counseling Social History:  History  Alcohol Use  . 0.0 oz/week    Comment: trying not to drink      History  Drug Use No    Additional Social History:                           Allergies:   Allergies  Allergen Reactions  . Prednisone Other (See Comments)    Hypertension, makes him feel spacey  . Hydrocodone Other (See Comments)    Headache, irritable  . Varenicline Other (See Comments)    Suicidal thoughts   Lab Results:  Results for orders placed or performed during the hospital encounter of 05/26/16 (from the past 48 hour(s))  Lipid panel     Status: Abnormal   Collection Time: 05/27/16  7:25 AM  Result Value Ref Range   Cholesterol 217 (H) 0 - 200 mg/dL   Triglycerides 334 (H) <150 mg/dL   HDL 32 (L) >40 mg/dL   Total CHOL/HDL Ratio 6.8 RATIO   VLDL 67 (H) 0 - 40 mg/dL   LDL Cholesterol 118 (H) 0 - 99 mg/dL    Comment:        Total Cholesterol/HDL:CHD Risk Coronary Heart Disease Risk Table                     Men   Women  1/2 Average Risk   3.4   3.3  Average Risk       5.0   4.4  2 X Average Risk   9.6   7.1  3 X  Average Risk  23.4   11.0        Use the calculated Patient Ratio above and the CHD Risk Table to determine the patient's CHD Risk.        ATP III CLASSIFICATION (LDL):  <100     mg/dL   Optimal  100-129  mg/dL   Near or Above                    Optimal  130-159  mg/dL   Borderline  160-189  mg/dL   High  >190     mg/dL   Very High   TSH     Status: None   Collection Time: 05/27/16  7:25 AM  Result Value Ref Range   TSH 0.732 0.350 - 4.500 uIU/mL    Comment: Performed by a 3rd Generation assay with a functional sensitivity of <=0.01 uIU/mL.    Blood Alcohol level:  Lab Results  Component Value Date   ETH <5 05/24/2016   ETH <5 61/44/3154    Metabolic Disorder Labs:  Lab Results  Component Value Date   HGBA1C 5.9 (H) 04/05/2016   MPG 123 04/05/2016   MPG 114 01/02/2015   No results found for: PROLACTIN Lab Results  Component Value Date   CHOL 217 (H) 05/27/2016   TRIG 334 (H) 05/27/2016   HDL 32 (L) 05/27/2016   CHOLHDL 6.8 05/27/2016   VLDL 67 (H) 05/27/2016   LDLCALC 118 (H) 05/27/2016   LDLCALC UNABLE TO REPORT DUE TO LIPEMIC INTERFERENCE 04/05/2016    Current Medications: Current Facility-Administered Medications  Medication Dose Route Frequency Provider Last Rate Last Dose  . acetaminophen (TYLENOL) tablet 650 mg  650 mg Oral Q6H PRN Gonzella Lex, MD      . alum & mag hydroxide-simeth (MAALOX/MYLANTA) 200-200-20 MG/5ML suspension 30 mL  30 mL Oral Q4H PRN Gonzella Lex, MD      . magnesium hydroxide (MILK OF MAGNESIA) suspension 30 mL  30 mL Oral Daily PRN Gonzella Lex, MD      . traZODone (DESYREL) tablet 100 mg  100 mg Oral QHS PRN Gonzella Lex, MD       PTA Medications: Prescriptions Prior to Admission  Medication Sig Dispense Refill Last Dose  . aspirin EC 81 MG EC tablet Take 1 tablet (81 mg total) by mouth daily. (Patient not taking: Reported on 04/12/2016)   Not Taking at Unknown time  . atorvastatin (LIPITOR) 80 MG tablet Take 1 tablet (80  mg total) by mouth daily at 6 PM. 30 tablet 5 05/13/2016 at unknown  . carvedilol (COREG) 6.25 MG tablet Take 1 tablet (6.25 mg total) by mouth 2 (two) times daily with a meal. 60 tablet 5 Past Week at Unknown time  . chlordiazePOXIDE (LIBRIUM) 25 MG capsule 50 mg 3 times a day x 3 days 50 mg 2 times a day for 3 days 50 mg once a day for 3 days stop 36 capsule 0   . clonazePAM (KLONOPIN) 1 MG tablet Take 1 tablet (1 mg total) by mouth 3 (three) times daily as needed for anxiety. (Patient not taking: Reported on 05/12/2016) 21 tablet 0 Not Taking at Unknown time  . dexlansoprazole (DEXILANT) 60 MG capsule Take 60 mg by mouth daily.   05/13/2016 at unknown  . divalproex (DEPAKOTE ER) 500 MG 24 hr tablet Take 500 mg by mouth 2 (two) times daily.     Marland Kitchen escitalopram (LEXAPRO) 20 MG tablet Take 20 mg by mouth 2 (two) times daily.   Past Week at Unknown time  . FLUoxetine (PROZAC) 20 MG capsule Take 1 capsule (20 mg total) by mouth at bedtime. (Patient not taking: Reported on 05/14/2016) 7 capsule 0 Not Taking at unknown  . lisinopril (PRINIVIL,ZESTRIL) 5 MG tablet Take 1 tablet (  5 mg total) by mouth daily. 30 tablet 0 05/13/2016 at unknown  . metoprolol tartrate (LOPRESSOR) 25 MG tablet Take 25 mg by mouth 2 (two) times daily.   Not Taking at unknown  . mirtazapine (REMERON) 30 MG tablet Take 60 mg by mouth at bedtime.   4 unknown at unknown  . nicotine (NICODERM CQ - DOSED IN MG/24 HOURS) 21 mg/24hr patch Place 1 patch (21 mg total) onto the skin daily. (Patient not taking: Reported on 04/12/2016) 28 patch 0 Not Taking at Unknown time  . nitroGLYCERIN (NITROSTAT) 0.4 MG SL tablet Place 1 tablet (0.4 mg total) under the tongue every 5 (five) minutes x 3 doses as needed for chest pain. 25 tablet 2 pen at prn  . OLANZapine (ZYPREXA) 5 MG tablet Take 1 tablet (5 mg total) by mouth at bedtime. (Patient not taking: Reported on 05/11/2016) 7 tablet 0 Not Taking at Unknown time  . QUEtiapine (SEROQUEL) 100 MG tablet  Take 1 tablet (100 mg total) by mouth at bedtime. 30 tablet 0   . ticagrelor (BRILINTA) 90 MG TABS tablet Take 1 tablet (90 mg total) by mouth 2 (two) times daily. 60 tablet 10 05/13/2016 at pm    Musculoskeletal: Strength & Muscle Tone: within normal limits Gait & Station: normal Patient leans: N/A  Psychiatric Specialty Exam: Physical Exam  ROS  Blood pressure (!) 148/83, pulse 83, temperature 98.1 F (36.7 C), resp. rate 18, height 6\' 2"  (1.88 m), weight 180 lb (81.6 kg), SpO2 99 %.Body mass index is 23.11 kg/m.  General Appearance: Casual  Eye Contact:  Poor  Speech:  Clear and Coherent  Volume:  Normal  Mood:  Anxious and Dysphoric  Affect:  Blunt and Constricted  Thought Process:  Coherent  Orientation:  Full (Time, Place, and Person)  Thought Content:  Rumination  Suicidal Thoughts:  No  Homicidal Thoughts:  No  Memory:  Immediate;   Fair Recent;   Fair Remote;   Fair  Judgement:  Impaired  Insight:  Lacking  Psychomotor Activity:  Normal  Concentration:  Concentration: Fair and Attention Span: Fair  Recall:  [employer] of Knowledge:  Poor  Language:  Fair  Akathisia:  No  Handed:  Right  AIMS (if indicated):     Assets:  Communication Skills  ADL's:  Intact  Cognition:  WNL  Sleep:  Number of Hours: 4.45    Treatment Plan Summary: Daily contact with patient to assess and evaluate symptoms and progress in treatment and Medication management  Observation Level/Precautions:  15 minute checks  Laboratory:  Lipid panel with elevated cholesterol,ldl, triglycerides, HgA1C 5.9, TSH is normal  Psychotherapy:  Patient to engage in therapy and develop coping skills to address his substance dependence and depression.  Medications:  None except for prn meds  Consultations:  As needed  Discharge Concerns:  Follow up plan for outpatient care and stabilization  Estimated LOS:3-4 days  Other:     Physician Treatment Plan for Primary Diagnosis: MDD, without psychotic  symptoms  Start Zoloft at 25mg  po qd.  Long Term Goal(s): Improvement in symptoms so as ready for discharge  Short Term Goals: Ability to identify changes in lifestyle to reduce recurrence of condition will improve, Ability to verbalize feelings will improve, Ability to disclose and discuss suicidal ideas, Ability to demonstrate self-control will improve, Ability to identify and develop effective coping behaviors will improve, Ability to maintain clinical measurements within normal limits will improve and Compliance with prescribed medications will improve  Physician Treatment Plan for Secondary Diagnosis: Active Problems:   Severe recurrent major depression without psychotic features ([place])  Long Term Goal(s): Improvement in symptoms so as ready for discharge  Short Term Goals: Ability to identify changes in lifestyle to reduce recurrence of condition will improve, Ability to verbalize feelings will improve, Ability to disclose and discuss suicidal ideas, Ability to demonstrate self-control will improve, Ability to identify and develop effective coping behaviors will improve, Ability to maintain clinical measurements within normal limits will improve and Compliance with prescribed medications will improve  I certify that inpatient services furnished can reasonably be expected to improve the patient's condition.    Elvin So, MD 3/3/20181:02 PM

## 2016-05-27 NOTE — Progress Notes (Signed)
Pt isolated to room majority of shift. Requesting meds to help "his head feel right". Pt did not initiate conversation other than to ask about medications. Denies pain. Affect anxious and depressed. Med focused. Voices no additional concerns at this time. Safety maintained. Will continue to monitor.

## 2016-05-27 NOTE — [facility] Group Notes (Signed)
[facility] LCSW Group Therapy  05/27/2016 3:22 PM  Type of Therapy:  Group Therapy  Participation Level:  Patient did not attend group. CSW invited patient to group.   Summary of Progress/Problems: Communications: Patients identify how individuals communicate with one another appropriately and inappropriately. Patients will be guided to discuss their thoughts, feelings, and behaviors related to barriers when communicating. The group will process together ways to execute positive and appropriate communications.   Zaydon Kinser G. [place], [place] 05/27/2016, 3:22 PM

## 2016-05-28 ENCOUNTER — Encounter: Payer: Self-pay | Admitting: Psychiatry

## 2016-05-28 LAB — HEMOGLOBIN A1C
Hgb A1c MFr Bld: 5.6 % (ref 4.8–5.6)
MEAN PLASMA GLUCOSE: 114 mg/dL

## 2016-05-28 MED ORDER — ATORVASTATIN CALCIUM 20 MG PO TABS
40.0000 mg | ORAL_TABLET | Freq: Every day | ORAL | Status: DC
  Administered 2016-05-28 – 2016-06-01 (×5): 40 mg via ORAL
  Filled 2016-05-28 (×5): qty 2

## 2016-05-28 MED ORDER — METOPROLOL TARTRATE 25 MG PO TABS
25.0000 mg | ORAL_TABLET | Freq: Two times a day (BID) | ORAL | Status: DC
  Administered 2016-05-28 – 2016-05-30 (×4): 25 mg via ORAL
  Filled 2016-05-28 (×4): qty 1

## 2016-05-28 MED ORDER — CLONIDINE HCL 0.1 MG PO TABS
0.0500 mg | ORAL_TABLET | Freq: Once | ORAL | Status: AC
  Administered 2016-05-28: 0.05 mg via ORAL
  Filled 2016-05-28: qty 1

## 2016-05-28 MED ORDER — SERTRALINE HCL 25 MG PO TABS
50.0000 mg | ORAL_TABLET | Freq: Every day | ORAL | Status: DC
  Administered 2016-05-29 – 2016-05-30 (×2): 50 mg via ORAL
  Filled 2016-05-28 (×2): qty 2

## 2016-05-28 NOTE — Progress Notes (Signed)
[place] responded to a OR for suicidal thoughts. Pt presented scared and confused. Pt stated he has not eaten for a couple of days because of diarrhea. He also stated that he has not slept. Pt is afraid he will be put in jail because of past DUI's. He feels he could not survive jail, and fears losing his children as well. CH offered a prayer for relief of his anxiety, cessation of his diarrhea, and for sleep. CH is available for follow up as needed.    05/28/16 1000  Clinical Encounter Type  Visited With Patient  Visit Type Initial;Spiritual support  Referral From Nurse  Consult/Referral To Chaplain  Spiritual Encounters  Spiritual Needs Prayer;Emotional

## 2016-05-28 NOTE — [facility] Group Notes (Signed)
[facility] Group Notes:  (Nursing/MHT/Case Management/Adjunct)  Date:  05/28/2016  Time:  2:45 AM  Type of Therapy:  Psychoeducational Skills  Participation Level:  Did Not Attend  Summary of Progress/Problems:  [name] 05/28/2016, 2:45 AM
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
## 2016-05-28 NOTE — BHH Group Notes (Signed)
[facility] Group Notes:  (Nursing/MHT/Case Management/Adjunct)  Date:  05/28/2016  Time:  11:10 PM  Type of Therapy:  Evening Wrap-up Group  Participation Level:  Active  Participation Quality:  Appropriate and Attentive  Affect:  Appropriate  Cognitive:  Alert and Appropriate  Insight:  Appropriate and Good  Engagement in Group:  Developing/Improving and Engaged  Modes of Intervention:  Discussion  Summary of Progress/Problems:  Levonne Spiller 05/28/2016, 11:10 PM

## 2016-05-28 NOTE — Plan of Care (Signed)
Problem: Activity: Goal: Interest or engagement in activities will improve Outcome: Not Progressing Pt isolates to room frequently during free time.

## 2016-05-28 NOTE — Progress Notes (Signed)
Patient requesting something for anxiety.  Informed that hydroxyzine is what he has for anxiety. Patient states "that will not work, do have a shot or some ativan."  Explained that hydroxyzine all you have.  Patient again states   "that will not work"  Then patient starting stating  "Lord how mercy, help me please."  Informed Dr. Einar Grad of what patient stated.  Dr. Einar Grad instructed to give a cup of sprite with ice and inform patient that will help him cool down and help with anxiety and to also tell him to do some exercises and that will help with the anxiety. Patient informed.

## 2016-05-28 NOTE — BHH Suicide Risk Assessment (Signed)
[facility] INPATIENT:  Family/Significant Other Suicide Prevention Education  Suicide Prevention Education:  Education Completed;Michael Schmitt(sister [phone]), has been identified by the patient as the family member/significant other with whom the patient will be residing, and identified as the person(s) who will aid the patient in the event of a mental health crisis (suicidal ideations/suicide attempt).  With written consent from the patient, the family member/significant other has been provided the following suicide prevention education, prior to the and/or following the discharge of the patient.  The suicide prevention education provided includes the following:  Suicide risk factors  Suicide prevention and interventions  National Suicide Hotline telephone number  [facility] assessment telephone number  [facility] Emergency Assistance [place] and/or Residential Mobile Crisis Unit telephone number  Request made of family/significant other to:  Remove weapons (e.g., guns, rifles, knives), all items previously/currently identified as safety concern.    Remove drugs/medications (over-the-counter, prescriptions, illicit drugs), all items previously/currently identified as a safety concern.  The family member/significant other verbalizes understanding of the suicide prevention education information provided.  The family member/significant other agrees to remove the items of safety concern listed above.  Michael Schmitt G. [place], [place] 05/28/2016, 4:26 PM

## 2016-05-28 NOTE — BHH Group Notes (Signed)
[facility] LCSW Group Therapy  05/28/2016 2:45 PM  Type of Therapy:  Group Therapy  Participation Level:  Patient did not attend group. CSW invited patient to group.   Summary of Progress/Problems: Coping Skills: Patients defined and discussed healthy coping skills. Patients identified healthy coping skills they would like to try during hospitalization and after discharge. CSW offered insight to varying coping skills that may have been new to patients such as practicing mindfulness.  Jiselle Sheu G. [place], [place] 05/28/2016, 2:45 PM

## 2016-05-28 NOTE — Progress Notes (Signed)
Pt isolated to room. Refused PRN Trazodone and Vistaril after it was offered due to Patient stating that he did not sleep well last night. Pt refused stating "Trazodone doesn't work, it keeps me up". Attended evening group. Minimal interaction with peers and staff. Denies pain and voices no additional concerns at this time. Encouragement and support provided. Safety maintained. Will continue to monitor.

## 2016-05-29 MED ORDER — QUETIAPINE FUMARATE ER 50 MG PO TB24
100.0000 mg | ORAL_TABLET | Freq: Every day | ORAL | Status: DC
  Administered 2016-05-29 – 2016-05-31 (×3): 100 mg via ORAL
  Filled 2016-05-29 (×3): qty 2

## 2016-05-29 MED ORDER — NICOTINE 21 MG/24HR TD PT24
21.0000 mg | MEDICATED_PATCH | Freq: Every day | TRANSDERMAL | Status: DC
Start: 2016-05-29 — End: 2016-06-02
  Administered 2016-05-29 – 2016-06-02 (×5): 21 mg via TRANSDERMAL
  Filled 2016-05-29 (×5): qty 1

## 2016-05-29 NOTE — BHH Group Notes (Signed)
[facility] Group Notes:  (Nursing/MHT/Case Management/Adjunct)  Date:  05/29/2016  Time:  5:56 PM  Type of Therapy:  Psychoeducational Skills  Participation Level:  Active  Participation Quality:  Appropriate, Attentive and Supportive  Affect:  Appropriate  Cognitive:  Appropriate  Insight:  Appropriate  Engagement in Group:  Engaged and Supportive  Modes of Intervention:  Discussion and Education  Summary of Progress/Problems:  Charise Killian 05/29/2016, 5:56 PM

## 2016-05-29 NOTE — Progress Notes (Signed)
Pt awake, alert, oriented and up on unit today. Appropriately interacts with staff/peers. Observed socializing in dayroom/hallway with peers. Complains of anxiety and confusion related to stress. Pt did take a dose of PRN hydroxyzine as ordered, but reports this medication does not help. Requests ativan at morning medication pass. Reports fair sleep without the help of sleep medications, which is noted pt refused last night. Reports fair appetite, normal energy, good concentration. Rates depression 10/10, hopelessness 5/10, anxiety 8/10 (low 0-10 high). Denies SI/HI/AVH. Goal today is "myself" by "ask for help." Pt is cooperative, no behavioral issues. Did attend group. Pt is medication compliant.   Support and encouragement provided with use of therapeutic communication. Medications administered with education as ordered. Safety maintained with every 15 minute checks. Will continue to monitor.

## 2016-05-29 NOTE — Plan of Care (Signed)
Problem: Coping: Goal: Ability to cope will improve Outcome: Not Progressing Pt continues to complain of anxiety and confusion related to stress. Did take a hydroxyzine as ordered this morning, but reports no change. Pt verbally request ativan.

## 2016-05-29 NOTE — Progress Notes (Signed)
[facility] MD Progress Note  05/29/2016 12:00 PM Michael Schmitt  MRN:  [id number] Subjective:    05/28/2016. Patient seen in his room. Reports feeling anxious. Denies suicidal thoughts. Compliant with Zoloft , tolerating well. Slept well, good appetite.  Per nursing, patient presenting with anxiety, requesting ativan, was told he has vistaril and given strategies to help with his anxiety. Patient not attending groups.  05/29/2016. Michael Schmitt complains of feeling anxious and confused. He is under considerable stress from pending legal charges. He knows he will have to go to jail and this would be unbearable. His charges are related to drinking. He is very pessimistic and MEDICATIONS and believes that on the Klonopin is helpful. He was explained that Klonopin is not appropriate for someone with substance abuse history. He minimizes his substance abuse problems and is not interested in treatment. He started participating in groups yesterday. There are no somatic complaints.  Per nursing: Pt isolated to room. Refused PRN Trazodone and Vistaril after it was offered due to Patient stating that he did not sleep well last night. Pt refused stating "Trazodone doesn't work, it keeps me up". Attended evening group. Minimal interaction with peers and staff. Denies pain and voices no additional concerns at this time. Encouragement and support provided. Safety maintained. Will continue to monitor.  Principal Problem: Severe recurrent major depression without psychotic features ([place]) Diagnosis:   Patient Active Problem List   Diagnosis Date Noted  . Phencyclidine (PCP) use disorder, severe, dependence ([place]) [F16.20] 05/28/2016  . Severe recurrent major depression without psychotic features ([place]) [F33.2] 05/26/2016  . Alcohol abuse [F10.10] 05/12/2016  . Substance induced mood disorder ([place]) [F19.94] 05/12/2016  . Sedative, hypnotic or anxiolytic use disorder, severe, dependence ([place]) [F13.20] 05/12/2016  .  Ventricular fibrillation ([place]) [I49.01] 04/05/2016  . 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 Generalized anxiety disorder [F41.1] 08/11/2015  . Major depressive disorder, recurrent episode, moderate with anxious distress ([place]) [F33.1] 06/21/2015  . NSTEMI (non-ST elevated myocardial infarction) ([place]) [I21.4] 06/20/2015  . Acute ST elevation myocardial infarction (STEMI) involving left anterior descending (LAD) coronary artery ([place]) [I21.02] 01/01/2015  . Acute MI, inferoposterior wall ([place]) [I21.19] 01/01/2015  . Common bile duct dilation [K83.8] 10/06/2014  . Right lower quadrant abdominal pain [R10.31] 10/06/2014  . Loss of weight [R63.4] 10/06/2014  . Cervicogenic headache [R51] 07/24/2014  . Cervical spondylosis without myelopathy [M47.812] 07/24/2014  . Clinical depression [F32.9] 04/15/2013  . Acid reflux [K21.9] 04/15/2013  . BP (high blood pressure) [I10] 04/15/2013  . Encounter for other preprocedural examination [H60.737] 04/15/2013  . Nasal obstruction [J34.89] 02/12/2013  . Inflamed nasal mucosa [J31.0] 02/12/2013  . Sinus infection [J32.9] 02/12/2013  . Gastric catarrh [K29.70] 03/01/2011  . Allergic rhinitis [J30.9] 10/20/2010  . Airway hyperreactivity [J45.909] 10/20/2010  . Anxiety, generalized [F41.1] 10/20/2010  . Cervical pain [M54.2] 10/20/2010  . Tobacco use disorder [F17.200] 10/20/2010   Total Time spent with patient: 20 minutes  Past Psychiatric History:  Patient has a history of depression. Has been prescribed multiple medicines with partial to no improvement. He has recently been noncompliant with his outpatient provider. Chronic anxiety symptoms. He has been offered ECT in the past but did not follow through with full course. Unclear if it would be of any benefit to him. No history of suicide attempts or violence  Past Medical History:  Past Medical History:  Diagnosis Date  . Anxiety   . Anxiety   . Arthritis    cerv. stenosis, spondylosis, HNP- lower  back , has been followed in pain clinic, has  had injection s in cerv. area  . Blood dyscrasia    told that when he was young he was a" free bleeder"  . CAD (coronary artery disease)   . Cervical spondylosis without myelopathy 07/24/2014  . Cervicogenic headache 07/24/2014  . Chronic kidney disease    renal calculi- passed spontaneously  . Depression   . Diabetes mellitus without complication ([place])   . Fatty liver   . GERD (gastroesophageal reflux disease)   . Headache(784.0)   . Hyperlipidemia   . Hypertension   . Mental disorder   . MI, old   . RLS (restless legs syndrome)    detected on sleep study  . Shortness of breath   . Ventricular fibrillation ([place]) 04/05/2016    Past Surgical History:  Procedure Laterality Date  . ANTERIOR CERVICAL DECOMP/DISCECTOMY FUSION  11/13/2011   Procedure: ANTERIOR CERVICAL DECOMPRESSION/DISCECTOMY FUSION 1 LEVEL;  Surgeon: [name], MD;  Location: [place] [facility];  Service: Neurosurgery;  Laterality: N/A;  Anterior Cervical Decompression/discectomy Fusion. Cervical three-four.  Marland Kitchen CARDIAC CATHETERIZATION N/A 01/01/2015   Procedure: Left Heart Cath and Coronary Angiography;  Surgeon: [name], MD;  Location: [facility];  Service: Cardiovascular;  Laterality: N/A;  . CARDIAC CATHETERIZATION N/A 04/05/2016   Procedure: Left Heart Cath and Coronary Angiography;  Surgeon: Belva Crome, MD;  Location: [facility];  Service: Cardiovascular;  Laterality: N/A;  . CARDIAC CATHETERIZATION N/A 04/05/2016   Procedure: Coronary Stent Intervention;  Surgeon: Belva Crome, MD;  Location: [place] [facility] LAB;  Service: Cardiovascular;  Laterality: N/A;  . CARDIAC CATHETERIZATION N/A 04/05/2016   Procedure: Intravascular Ultrasound/IVUS;  Surgeon: Belva Crome, MD;  Location: [facility] CV LAB;  Service: Cardiovascular;  Laterality: N/A;  . NASAL SINUS SURGERY     2005   Family History:  Family History  Problem Relation Age of Onset  . Prostate  cancer Father   . Hypertension Mother   . Kidney Stones Mother   . Anxiety disorder Mother   . Depression Mother   . COPD Sister   . Hypertension Sister   . Diabetes Sister   . Depression Sister   . Anxiety disorder Sister   . Seizures Sister    Family Psychiatric  History: unknown Social History:  History  Alcohol Use  . 0.0 oz/week    Comment: trying not to drink      History  Drug Use No    Social History   Social History  . Marital status: Widowed    Spouse name: N/A  . Number of children: 3  . Years of education: 12   Occupational History  .      unemployed   Social History Main Topics  . Smoking status: Current Every Day Smoker    Packs/day: 2.00    Years: 17.00    Types: Cigarettes, Cigars  . Smokeless tobacco: Former Systems developer     Comment: occassional snuff   . Alcohol use 0.0 oz/week     Comment: trying not to drink   . Drug use: No  . Sexual activity: Not Currently   Other Topics Concern  . None   Social History Narrative   Patient is right handed.   Patient drinks 2 sodas daily.   Additional Social History:  Sleep: Fair  Appetite:  Fair  Current Medications: Current Facility-Administered Medications  Medication Dose Route Frequency Provider Last Rate Last Dose  . acetaminophen (TYLENOL) tablet 650 mg  650 mg Oral Q6H PRN Gonzella Lex, MD      . alum & mag hydroxide-simeth (MAALOX/MYLANTA) 200-200-20 MG/5ML suspension 30 mL  30 mL Oral Q4H PRN Gonzella Lex, MD      . atorvastatin (LIPITOR) tablet 40 mg  40 mg Oral q1800 Himabindu Ravi, MD   40 mg at 05/28/16 1651  . hydrOXYzine (ATARAX/VISTARIL) tablet 25 mg  25 mg Oral TID PRN Himabindu Ravi, MD   25 mg at 05/29/16 0836  . magnesium hydroxide (MILK OF MAGNESIA) suspension 30 mL  30 mL Oral Daily PRN Gonzella Lex, MD      . metoprolol tartrate (LOPRESSOR) tablet 25 mg  25 mg Oral BID Himabindu Ravi, MD   25 mg at 05/29/16 0998  . sertraline (ZOLOFT)  tablet 50 mg  50 mg Oral Daily Himabindu Ravi, MD   50 mg at 05/29/16 3382  . traZODone (DESYREL) tablet 100 mg  100 mg Oral QHS PRN Gonzella Lex, MD        Lab Results:  No results found for this or any previous visit (from the past 48 hour(s)).  Blood Alcohol level:  Lab Results  Component Value Date   ETH <5 05/24/2016   ETH <5 50/53/9767    Metabolic Disorder Labs: Lab Results  Component Value Date   HGBA1C 5.6 05/27/2016   MPG 114 05/27/2016   MPG 123 04/05/2016   No results found for: PROLACTIN Lab Results  Component Value Date   CHOL 217 (H) 05/27/2016   TRIG 334 (H) 05/27/2016   HDL 32 (L) 05/27/2016   CHOLHDL 6.8 05/27/2016   VLDL 67 (H) 05/27/2016   LDLCALC 118 (H) 05/27/2016   [place] UNABLE TO REPORT DUE TO LIPEMIC INTERFERENCE 04/05/2016    Physical Findings: AIMS:  , ,  ,  ,    CIWA:    COWS:     Musculoskeletal: Strength & Muscle Tone: within normal limits Gait & Station: normal Patient leans: N/A  Psychiatric Specialty Exam: Physical Exam  Nursing note and vitals reviewed. Psychiatric: His speech is normal. His mood appears anxious. He is withdrawn. Cognition and memory are normal. He expresses impulsivity. He expresses suicidal ideation. He expresses suicidal plans.    Review of Systems  Psychiatric/Behavioral: Positive for depression and suicidal ideas.  All other systems reviewed and are negative.   Blood pressure 139/81, pulse 89, temperature 98.2 F (36.8 C), temperature source Oral, resp. rate 18, height 6\' 2"  (1.88 m), weight 81.6 kg (180 lb), SpO2 99 %.Body mass index is 23.11 kg/m.  General Appearance: Casual  Eye Contact:  fair  Speech:  Clear and Coherent  Volume:  Normal  Mood:  Anxious and Dysphoric  Affect:  Blunt and Constricted  Thought Process:  Coherent  Orientation:  Full (Time, Place, and Person)  Thought Content:  Rumination  Suicidal Thoughts:  No  Homicidal Thoughts:  No  Memory:  Immediate;   Fair Recent;    Fair Remote;   Fair  Judgement:  Impaired  Insight:  Lacking  Psychomotor Activity:  Normal  Concentration:  Concentration: Fair and Attention Span: Fair  Recall:  [employer] of Knowledge:  Poor  Language:  Fair  Akathisia:  No  Handed:  Right  AIMS (if indicated):     Assets:  Communication  Skills  ADL's:  Intact  Cognition:  WNL  Sleep:  Number of Hours: 6                                                          Treatment Plan Summary: Daily contact with patient to assess and evaluate symptoms and progress in treatment and Medication management   Michael Schmitt is a 46 year old male with history of depression, anxiety, mood instability, and substance abuse admitted for suicidal ideation.   1. Suicidal ideation. The patient is able to contract for safety in the hospital.   2. Mood. He was started on Zoloft and the dose was increased to 50 mg daily for depression.   3. Benzodiazepine abuse.  Patient minimizing, encourage patient to follow up with AA/NA meetings and substance abuse counselling.  4. Insomnia. He is on trazodone.  5. Anxiety. He is on Vistaril.  6. Metabolic syndrome monitoring. Lipid panel shows elevated cholesterol, LDL and triglycerides. Hemoglobin A1c and TSH are normal. We restarted Lipitor.   6. HTN. We restarted Metoprolol at 25mg  po bid and given one dose of clonidine 0.05mg .  (patient has not been compliant per admission assessment).  7. Smoking. Nicotine patch is available.  8. Disposition. He will be discharged to home with family.     Orson Slick, MD 05/29/2016, 12:00 PM

## 2016-05-29 NOTE — BHH Group Notes (Signed)
[facility] Group Notes:  (Nursing/MHT/Case Management/Adjunct)  Date:  05/29/2016  Time:  11:20 PM  Type of Therapy:  Psychoeducational Skills  Participation Level:  Minimal  Participation Quality:  Appropriate  Affect:  Appropriate  Cognitive:  Alert  Insight:  Good  Engagement in Group:  Engaged  Modes of Intervention:  Activity  Summary of Progress/Problems:  Michael Schmitt 05/29/2016, 11:20 PM

## 2016-05-29 NOTE — Tx Team (Signed)
Interdisciplinary Treatment and Diagnostic Plan Update  05/29/2016 Time of Session: 10:30 AM Michael Schmitt MRN: [id number]  Principal Diagnosis: Severe recurrent major depression without psychotic features [facility])  Secondary Diagnoses: Principal Problem:   Severe recurrent major depression without psychotic features ([place]) Active Problems:   Tobacco use disorder   Sedative, hypnotic or anxiolytic use disorder, severe, dependence ([place])   Phencyclidine (PCP) use disorder, severe, dependence ([place])   Current Medications:  Current Facility-Administered Medications  Medication Dose Route Frequency Provider Last Rate Last Dose  . acetaminophen (TYLENOL) tablet 650 mg  650 mg Oral Q6H PRN Gonzella Lex, MD      . alum & mag hydroxide-simeth (MAALOX/MYLANTA) 200-200-20 MG/5ML suspension 30 mL  30 mL Oral Q4H PRN Gonzella Lex, MD      . atorvastatin (LIPITOR) tablet 40 mg  40 mg Oral q1800 Himabindu Ravi, MD   40 mg at 05/28/16 1651  . hydrOXYzine (ATARAX/VISTARIL) tablet 25 mg  25 mg Oral TID PRN Himabindu Ravi, MD   25 mg at 05/29/16 0836  . magnesium hydroxide (MILK OF MAGNESIA) suspension 30 mL  30 mL Oral Daily PRN Gonzella Lex, MD      . metoprolol tartrate (LOPRESSOR) tablet 25 mg  25 mg Oral BID Himabindu Ravi, MD   25 mg at 05/29/16 3007  . sertraline (ZOLOFT) tablet 50 mg  50 mg Oral Daily Himabindu Ravi, MD   50 mg at 05/29/16 6226  . traZODone (DESYREL) tablet 100 mg  100 mg Oral QHS PRN Gonzella Lex, MD       PTA Medications: Prescriptions Prior to Admission  Medication Sig Dispense Refill Last Dose  . aspirin EC 81 MG EC tablet Take 1 tablet (81 mg total) by mouth daily. (Patient not taking: Reported on 04/12/2016)   Not Taking at Unknown time  . atorvastatin (LIPITOR) 80 MG tablet Take 1 tablet (80 mg total) by mouth daily at 6 PM. 30 tablet 5 05/13/2016 at unknown  . carvedilol (COREG) 6.25 MG tablet Take 1 tablet (6.25 mg total) by mouth 2 (two) times daily with a meal. 60  tablet 5 Past Week at Unknown time  . chlordiazePOXIDE (LIBRIUM) 25 MG capsule 50 mg 3 times a day x 3 days 50 mg 2 times a day for 3 days 50 mg once a day for 3 days stop 36 capsule 0   . clonazePAM (KLONOPIN) 1 MG tablet Take 1 tablet (1 mg total) by mouth 3 (three) times daily as needed for anxiety. (Patient not taking: Reported on 05/12/2016) 21 tablet 0 Not Taking at Unknown time  . dexlansoprazole (DEXILANT) 60 MG capsule Take 60 mg by mouth daily.   05/13/2016 at unknown  . divalproex (DEPAKOTE ER) 500 MG 24 hr tablet Take 500 mg by mouth 2 (two) times daily.     Marland Kitchen escitalopram (LEXAPRO) 20 MG tablet Take 20 mg by mouth 2 (two) times daily.   Past Week at Unknown time  . FLUoxetine (PROZAC) 20 MG capsule Take 1 capsule (20 mg total) by mouth at bedtime. (Patient not taking: Reported on 05/14/2016) 7 capsule 0 Not Taking at unknown  . lisinopril (PRINIVIL,ZESTRIL) 5 MG tablet Take 1 tablet (5 mg total) by mouth daily. 30 tablet 0 05/13/2016 at unknown  . metoprolol tartrate (LOPRESSOR) 25 MG tablet Take 25 mg by mouth 2 (two) times daily.   Not Taking at unknown  . mirtazapine (REMERON) 30 MG tablet Take 60 mg by mouth at bedtime.  4 unknown at unknown  . nicotine (NICODERM CQ - DOSED IN MG/24 HOURS) 21 mg/24hr patch Place 1 patch (21 mg total) onto the skin daily. (Patient not taking: Reported on 04/12/2016) 28 patch 0 Not Taking at Unknown time  . nitroGLYCERIN (NITROSTAT) 0.4 MG SL tablet Place 1 tablet (0.4 mg total) under the tongue every 5 (five) minutes x 3 doses as needed for chest pain. 25 tablet 2 pen at prn  . OLANZapine (ZYPREXA) 5 MG tablet Take 1 tablet (5 mg total) by mouth at bedtime. (Patient not taking: Reported on 05/11/2016) 7 tablet 0 Not Taking at Unknown time  . QUEtiapine (SEROQUEL) 100 MG tablet Take 1 tablet (100 mg total) by mouth at bedtime. 30 tablet 0   . ticagrelor (BRILINTA) 90 MG TABS tablet Take 1 tablet (90 mg total) by mouth 2 (two) times daily. 60 tablet 10  05/13/2016 at pm    Patient Stressors: Medication change or noncompliance  Patient Strengths: Capable of independent living [employer] fund of knowledge  Treatment Modalities: Medication Management, Group therapy, Case management,  1 to 1 session with clinician, Psychoeducation, Recreational therapy.   Physician Treatment Plan for Primary Diagnosis: Severe recurrent major depression without psychotic features ([place]) Long Term Goal(s): Improvement in symptoms so as ready for discharge Improvement in symptoms so as ready for discharge   Short Term Goals: Ability to identify changes in lifestyle to reduce recurrence of condition will improve Ability to verbalize feelings will improve Ability to disclose and discuss suicidal ideas Ability to demonstrate self-control will improve Ability to identify and develop effective coping behaviors will improve Ability to maintain clinical measurements within normal limits will improve Compliance with prescribed medications will improve Ability to identify changes in lifestyle to reduce recurrence of condition will improve Ability to verbalize feelings will improve Ability to disclose and discuss suicidal ideas Ability to demonstrate self-control will improve Ability to identify and develop effective coping behaviors will improve Ability to maintain clinical measurements within normal limits will improve Compliance with prescribed medications will improve  Medication Management: Evaluate patient's response, side effects, and tolerance of medication regimen.  Therapeutic Interventions: 1 to 1 sessions, Unit Group sessions and Medication administration.  Evaluation of Outcomes: Progressing  Physician Treatment Plan for Secondary Diagnosis: Principal Problem:   Severe recurrent major depression without psychotic features ([place]) Active Problems:   Tobacco use disorder   Sedative, hypnotic or anxiolytic use disorder, severe,  dependence ([facility])   Phencyclidine (PCP) use disorder, severe, dependence ([place])  Long Term Goal(s): Improvement in symptoms so as ready for discharge Improvement in symptoms so as ready for discharge   Short Term Goals: Ability to identify changes in lifestyle to reduce recurrence of condition will improve Ability to verbalize feelings will improve Ability to disclose and discuss suicidal ideas Ability to demonstrate self-control will improve Ability to identify and develop effective coping behaviors will improve Ability to maintain clinical measurements within normal limits will improve Compliance with prescribed medications will improve Ability to identify changes in lifestyle to reduce recurrence of condition will improve Ability to verbalize feelings will improve Ability to disclose and discuss suicidal ideas Ability to demonstrate self-control will improve Ability to identify and develop effective coping behaviors will improve Ability to maintain clinical measurements within normal limits will improve Compliance with prescribed medications will improve     Medication Management: Evaluate patient's response, side effects, and tolerance of medication regimen.  Therapeutic Interventions: 1 to 1 sessions, Unit Group sessions and Medication administration.  Evaluation of Outcomes: Progressing   RN Treatment Plan for Primary Diagnosis: Severe recurrent major depression without psychotic features ([place]) Long Term Goal(s): Knowledge of disease and therapeutic regimen to maintain health will improve  Short Term Goals: Ability to demonstrate self-control, Ability to disclose and discuss suicidal ideas and Ability to identify and develop effective coping behaviors will improve  Medication Management: RN will administer medications as ordered by provider, will assess and evaluate patient's response and provide education to patient for prescribed medication. RN will report any adverse and/or  side effects to prescribing provider.  Therapeutic Interventions: 1 on 1 counseling sessions, Psychoeducation, Medication administration, Evaluate responses to treatment, Monitor vital signs and CBGs as ordered, Perform/monitor CIWA, COWS, AIMS and Fall Risk screenings as ordered, Perform wound care treatments as ordered.  Evaluation of Outcomes: Progressing   LCSW Treatment Plan for Primary Diagnosis: Severe recurrent major depression without psychotic features ([place]) Long Term Goal(s): Safe transition to appropriate next level of care at discharge, Engage patient in therapeutic group addressing interpersonal concerns.  Short Term Goals: Engage patient in aftercare planning with referrals and resources, Increase social support and Increase ability to appropriately verbalize feelings  Therapeutic Interventions: Assess for all discharge needs, 1 to 1 time with Social worker, Explore available resources and support systems, Assess for adequacy in community support network, Educate family and significant other(s) on suicide prevention, Complete Psychosocial Assessment, Interpersonal group therapy.  Evaluation of Outcomes: Progressing   Progress in Treatment: Attending groups: Yes. Participating in groups: Yes. Taking medication as prescribed: Yes. Toleration medication: Yes. Family/Significant other contact made: Yes, individual(s) contacted:  sister was contacted. Patient understands diagnosis: Yes. Discussing patient identified problems/goals with staff: Yes. Medical problems stabilized or resolved: Yes. Denies suicidal/homicidal ideation: Yes. Issues/concerns per patient self-inventory: No.  New problem(s) identified: No, Describe:  None identified.  New Short Term/Long Term Goal(s): Pt's goal is to return home and find effective coping mechanisms.  Discharge Plan or Barriers: Patient will return home and f/u with [facility].  Reason for Continuation of Hospitalization:  Depression Withdrawal symptoms  Estimated Length of Stay: 1-2 days   Attendees: Patient: Michael Schmitt 05/29/2016 11:17 AM  Physician: Dr. Orson Slick, MD  05/29/2016 11:17 AM  Nursing: Polly Cobia, RN  05/29/2016 11:17 AM  RN Care Manager: 05/29/2016 11:17 AM  Social Worker: Glorious Peach, MSW, LCSW-A 05/29/2016 11:17 AM  Recreational Therapist:  05/29/2016 11:17 AM  Other:  05/29/2016 11:17 AM  Other:  05/29/2016 11:17 AM  Other: 05/29/2016 11:17 AM    Scribe for Treatment Team: Emilie Rutter, [place] 05/29/2016 11:17 AM

## 2016-05-29 NOTE — BHH Group Notes (Signed)
[facility] LCSW Group Therapy Note  Date/Time: 05/29/16, 1300  Type of Therapy and Topic:  Group Therapy:  Overcoming Obstacles  Participation Level:  minimal  Description of Group:    In this group patients will be encouraged to explore what they see as obstacles to their own wellness and recovery. They will be guided to discuss their thoughts, feelings, and behaviors related to these obstacles. The group will process together ways to cope with barriers, with attention given to specific choices patients can make. Each patient will be challenged to identify changes they are motivated to make in order to overcome their obstacles. This group will be process-oriented, with patients participating in exploration of their own experiences as well as giving and receiving support and challenge from other group members.  Therapeutic Goals: 1. Patient will identify personal and current obstacles as they relate to admission. 2. Patient will identify barriers that currently interfere with their wellness or overcoming obstacles.  3. Patient will identify feelings, thought process and behaviors related to these barriers. 4. Patient will identify two changes they are willing to make to overcome these obstacles:    Summary of Patient Progress: Pt reports that he is on disability and has nothing to do most days and ends up "overthinking" everything, which lead to worry and anxiety.  CSW and group did brainstorm with pt possible productive activities he could add to his schedule so that he was not sitting in his house for long periods of time.  Pt did offer that he could get reinvolved with church as a way of being more active.      Therapeutic Modalities:   Cognitive Behavioral Therapy Solution Focused Therapy Motivational Interviewing Relapse Prevention Therapy  Lurline Idol, LCSW

## 2016-05-30 LAB — PROLACTIN: PROLACTIN: 6.5 ng/mL (ref 4.0–15.2)

## 2016-05-30 MED ORDER — METOPROLOL TARTRATE 50 MG PO TABS
50.0000 mg | ORAL_TABLET | Freq: Two times a day (BID) | ORAL | Status: DC
  Administered 2016-05-30 – 2016-06-02 (×6): 50 mg via ORAL
  Filled 2016-05-30 (×7): qty 1

## 2016-05-30 MED ORDER — LISINOPRIL 5 MG PO TABS
5.0000 mg | ORAL_TABLET | Freq: Every day | ORAL | Status: DC
  Administered 2016-05-30 – 2016-06-02 (×4): 5 mg via ORAL
  Filled 2016-05-30 (×5): qty 1

## 2016-05-30 MED ORDER — FLUVOXAMINE MALEATE 50 MG PO TABS
50.0000 mg | ORAL_TABLET | Freq: Every day | ORAL | Status: DC
  Administered 2016-05-30 – 2016-05-31 (×2): 50 mg via ORAL
  Filled 2016-05-30 (×2): qty 1

## 2016-05-30 NOTE — Progress Notes (Addendum)
Pt used call light to call out for assistance in his room. Unable to understand what pt is staying, so this nurse went to his room. Pt found lying in bed on his stomach, breathing heavily and tearful, stating "I don't know what's wrong with me." Pt instructed to deep breathe, relax, use meditation to work through high anxiety. Pt somewhat cooperative. Encouraged pt to utilize activity other than being in his room like color, write, or work a puzzle. Pt refuses, stating "I don't want to do that."  VS stable. PRN anxiety medication given as ordered, see MAR. Pt was able to slow breathing, stop crying, and appears to be in no distress. Observed pt walk to the bathroom to urinate before exiting the room without issues. Pt turned around before this nurse left the room, and asked, "what do I need to do now? Can I lie down and pull the covers up over me? I'm cold." Pt somewhat childlike in his request. Informed pt that he is allowed to lie down and cover up with blanket in bed. Safety maintained with every 15 minute checks. Will continue to monitor.

## 2016-05-30 NOTE — Progress Notes (Signed)
D: Pt denies SI/HI/AVH. Pt is pleasant and cooperative, affect is blunted, thoughts are organized no bizarre behavior noted. Patient was noted to be restless he stated " l need something for sleep' Patient was noted to be pacing around the unit no aggression or hostile behavior notes at any time. Patient has minimal interaction with peers and staff.   A: Pt was offered support and encouragement. Pt was given scheduled medications. Pt was encouraged to attend groups. Q 15 minute checks were done for safety.  R:Pt attends groups and interacts well with peers and staff. Pt is taking medication. Pt has no complaints.Pt receptive to treatment and safety maintained on unit.

## 2016-05-30 NOTE — Progress Notes (Signed)
PT requested to speak to CSW, reports his anxiety is very high currently and he is not sure it will ever get better.  Pt very anxious about having to leave and go home while he is feeling this way.  Pt reports he does try to using breathing to calm himself down, also has been walking constantly on the unit.  CSW encouraged him to continue taking his medication, communicating with his MD, and participating in the scheduled activities as a way to cope with the anxiety. Winferd Humphrey, MSW, LCSW Clinical Social Worker 05/30/2016 2:03 PM

## 2016-05-30 NOTE — Progress Notes (Signed)
[facility] MD Progress Note  05/30/2016 1:47 PM Michael Schmitt  MRN:  [id number] Subjective:    05/28/2016. Patient seen in his room. Reports feeling anxious. Denies suicidal thoughts. Compliant with Zoloft , tolerating well. Slept well, good appetite.  Per nursing, patient presenting with anxiety, requesting ativan, was told he has vistaril and given strategies to help with his anxiety. Patient not attending groups.  05/29/2016. Michael Schmitt complains of feeling anxious and confused. He is under considerable stress from pending legal charges. He knows he will have to go to jail and this would be unbearable. His charges are related to drinking. He is very pessimistic and MEDICATIONS and believes that on the Klonopin is helpful. He was explained that Klonopin is not appropriate for someone with substance abuse history. He minimizes his substance abuse problems and is not interested in treatment. He started participating in groups yesterday. There are no somatic complaints.  05/30/2016. Michael Schmitt has multiple complaints today. He slept poorly, he is very anxious, he has diarrhea, he is confused, he is very depressed, his thoughts are racing, he had a bad dream last night, he woke up with a racing heart this morning. Yesterday he told me that he was very pessimistic about any pharmacotherapy but I was called last night to prescribe Seroquel for sleep. The patient also tells me that 60 mg of Remeron was helpful for insomnia. Some of his medications were prescribed by his primary care provider and they include Celexa, Prozac, Depakote, Lexapro. Seroquel comes from Dr. Weber Schmitt. The patient used to get clonazepam from Dr. Kasandra Knudsen at [facility]. It is no longer possible at the patient is a drinker. He understands that no benzodiazepines will be given in the hospital. He does not remember what other medicines Dr. Manuela Schwartz prescribes.  Per nursing: D: Pt denies SI/HI/AVH. Pt is pleasant and cooperative, affect is blunted, thoughts are  organized no bizarre behavior noted. Patient was noted to be restless he stated " l need something for sleep' Patient was noted to be pacing around the unit no aggression or hostile behavior notes at any time. Patient has minimal interaction with peers and staff.   A: Pt was offered support and encouragement. Pt was given scheduled medications. Pt was encouraged to attend groups. Q 15 minute checks were done for safety.  R:Pt attends groups and interacts well with peers and staff. Pt is taking medication. Pt has no complaints.Pt receptive to treatment and safety maintained on unit.  Principal Problem: Severe recurrent major depression without psychotic features ([place]) Diagnosis:   Patient Active Problem List   Diagnosis Date Noted  . Phencyclidine (PCP) use disorder, severe, dependence ([place]) [F16.20] 05/28/2016  . Severe recurrent major depression without psychotic features ([place]) [F33.2] 05/26/2016  . Alcohol abuse [F10.10] 05/12/2016  . Substance induced mood disorder ([place]) [F19.94] 05/12/2016  . Sedative, hypnotic or anxiolytic use disorder, severe, dependence ([place]) [F13.20] 05/12/2016  . Ventricular fibrillation ([place]) [I49.01] 04/05/2016  . 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 Generalized anxiety disorder [F41.1] 08/11/2015  . Major depressive disorder, recurrent episode, moderate with anxious distress ([place]) [F33.1] 06/21/2015  . NSTEMI (non-ST elevated myocardial infarction) ([place]) [I21.4] 06/20/2015  . Acute ST elevation myocardial infarction (STEMI) involving left anterior descending (LAD) coronary artery ([place]) [I21.02] 01/01/2015  . Acute MI, inferoposterior wall ([place]) [I21.19] 01/01/2015  . Common bile duct dilation [K83.8] 10/06/2014  . Right lower quadrant abdominal pain [R10.31] 10/06/2014  . Loss of weight [R63.4] 10/06/2014  . Cervicogenic headache [R51] 07/24/2014  .  Cervical spondylosis without myelopathy [M47.812] 07/24/2014  . Clinical depression [F32.9] 04/15/2013  .  Acid reflux [K21.9] 04/15/2013  . BP (high blood pressure) [I10] 04/15/2013  . Encounter for other preprocedural examination [E93.810] 04/15/2013  . Nasal obstruction [J34.89] 02/12/2013  . Inflamed nasal mucosa [J31.0] 02/12/2013  . Sinus infection [J32.9] 02/12/2013  . Gastric catarrh [K29.70] 03/01/2011  . Allergic rhinitis [J30.9] 10/20/2010  . Airway hyperreactivity [J45.909] 10/20/2010  . Anxiety, generalized [F41.1] 10/20/2010  . Cervical pain [M54.2] 10/20/2010  . Tobacco use disorder [F17.200] 10/20/2010   Total Time spent with patient: 20 minutes  Past Psychiatric History:  Patient has a history of depression. Has been prescribed multiple medicines with partial to no improvement. He has recently been noncompliant with his outpatient provider. Chronic anxiety symptoms. He has been offered ECT in the past but did not follow through with full course. Unclear if it would be of any benefit to him. No history of suicide attempts or violence  Past Medical History:  Past Medical History:  Diagnosis Date  . Anxiety   . Anxiety   . Arthritis    cerv. stenosis, spondylosis, HNP- lower back , has been followed in pain clinic, has  had injection s in cerv. area  . Blood dyscrasia    told that when he was young he was a" free bleeder"  . CAD (coronary artery disease)   . Cervical spondylosis without myelopathy 07/24/2014  . Cervicogenic headache 07/24/2014  . Chronic kidney disease    renal calculi- passed spontaneously  . Depression   . Diabetes mellitus without complication ([place])   . Fatty liver   . GERD (gastroesophageal reflux disease)   . Headache(784.0)   . Hyperlipidemia   . Hypertension   . Mental disorder   . MI, old   . RLS (restless legs syndrome)    detected on sleep study  . Shortness of breath   . Ventricular fibrillation ([place]) 04/05/2016    Past Surgical History:  Procedure Laterality Date  . ANTERIOR CERVICAL DECOMP/DISCECTOMY FUSION  11/13/2011   Procedure:  ANTERIOR CERVICAL DECOMPRESSION/DISCECTOMY FUSION 1 LEVEL;  Surgeon: [name], MD;  Location: [place] NEURO ORS;  Service: Neurosurgery;  Laterality: N/A;  Anterior Cervical Decompression/discectomy Fusion. Cervical three-four.  Marland Kitchen CARDIAC CATHETERIZATION N/A 01/01/2015   Procedure: Left Heart Cath and Coronary Angiography;  Surgeon: [name], MD;  Location: [place] CV LAB;  Service: Cardiovascular;  Laterality: N/A;  . CARDIAC CATHETERIZATION N/A 04/05/2016   Procedure: Left Heart Cath and Coronary Angiography;  Surgeon: Belva Crome, MD;  Location: [place] CV LAB;  Service: Cardiovascular;  Laterality: N/A;  . CARDIAC CATHETERIZATION N/A 04/05/2016   Procedure: Coronary Stent Intervention;  Surgeon: Belva Crome, MD;  Location: [place] CV LAB;  Service: Cardiovascular;  Laterality: N/A;  . CARDIAC CATHETERIZATION N/A 04/05/2016   Procedure: Intravascular Ultrasound/IVUS;  Surgeon: Belva Crome, MD;  Location: [place] CV LAB;  Service: Cardiovascular;  Laterality: N/A;  . NASAL SINUS SURGERY     2005   Family History:  Family History  Problem Relation Age of Onset  . Prostate cancer Father   . Hypertension Mother   . Kidney Stones Mother   . Anxiety disorder Mother   . Depression Mother   . COPD Sister   . Hypertension Sister   . Diabetes Sister   . Depression Sister   . Anxiety disorder Sister   . Seizures Sister    Family Psychiatric  History: unknown Social History:  History  Alcohol Use  . 0.0 oz/week    Comment: trying not to drink      History  Drug Use No    Social History   Social History  . Marital status: Widowed    Spouse name: N/A  . Number of children: 3  . Years of education: 12   Occupational History  .      unemployed   Social History Main Topics  . Smoking status: Current Every Day Smoker    Packs/day: 2.00    Years: 17.00    Types: Cigarettes, Cigars  . Smokeless tobacco: Former Systems developer     Comment: occassional snuff   . Alcohol  use 0.0 oz/week     Comment: trying not to drink   . Drug use: No  . Sexual activity: Not Currently   Other Topics Concern  . None   Social History Narrative   Patient is right handed.   Patient drinks 2 sodas daily.   Additional Social History:                         Sleep: Fair  Appetite:  Fair  Current Medications: Current Facility-Administered Medications  Medication Dose Route Frequency Provider Last Rate Last Dose  . acetaminophen (TYLENOL) tablet 650 mg  650 mg Oral Q6H PRN Gonzella Lex, MD   650 mg at 05/29/16 1708  . alum & mag hydroxide-simeth (MAALOX/MYLANTA) 200-200-20 MG/5ML suspension 30 mL  30 mL Oral Q4H PRN Gonzella Lex, MD      . atorvastatin (LIPITOR) tablet 40 mg  40 mg Oral q1800 Himabindu Ravi, MD   40 mg at 05/29/16 1706  . hydrOXYzine (ATARAX/VISTARIL) tablet 25 mg  25 mg Oral TID PRN Himabindu Ravi, MD   25 mg at 05/30/16 0817  . magnesium hydroxide (MILK OF MAGNESIA) suspension 30 mL  30 mL Oral Daily PRN Gonzella Lex, MD      . metoprolol tartrate (LOPRESSOR) tablet 25 mg  25 mg Oral BID Himabindu Ravi, MD   25 mg at 05/30/16 0815  . nicotine (NICODERM CQ - dosed in mg/24 hours) patch 21 mg  21 mg Transdermal Daily Aaleah Hirsch B Adalyn Pennock, MD   21 mg at 05/30/16 0816  . QUEtiapine (SEROQUEL XR) 24 hr tablet 100 mg  100 mg Oral QHS Angy Swearengin B Marsella Suman, MD   100 mg at 05/29/16 2244  . sertraline (ZOLOFT) tablet 50 mg  50 mg Oral Daily Himabindu Ravi, MD   50 mg at 05/30/16 0815    Lab Results:  No results found for this or any previous visit (from the past 61 hour(s)).  Blood Alcohol level:  Lab Results  Component Value Date   ETH <5 05/24/2016   ETH <5 82/42/3536    Metabolic Disorder Labs: Lab Results  Component Value Date   HGBA1C 5.6 05/27/2016   MPG 114 05/27/2016   MPG 123 04/05/2016   Lab Results  Component Value Date   PROLACTIN 6.5 05/27/2016   Lab Results  Component Value Date   CHOL 217 (H) 05/27/2016   TRIG  334 (H) 05/27/2016   HDL 32 (L) 05/27/2016   CHOLHDL 6.8 05/27/2016   VLDL 67 (H) 05/27/2016   LDLCALC 118 (H) 05/27/2016   LDLCALC UNABLE TO REPORT DUE TO LIPEMIC INTERFERENCE 04/05/2016    Physical Findings: AIMS:  , ,  ,  ,    CIWA:    COWS:     Musculoskeletal:  Strength & Muscle Tone: within normal limits Gait & Station: normal Patient leans: N/A  Psychiatric Specialty Exam: Physical Exam  Nursing note and vitals reviewed. Psychiatric: His speech is normal. His mood appears anxious. He is withdrawn. Cognition and memory are normal. He expresses impulsivity. He expresses suicidal ideation. He expresses suicidal plans.    Review of Systems  Psychiatric/Behavioral: Positive for depression and suicidal ideas.  All other systems reviewed and are negative.   Blood pressure 135/90, pulse (!) 110, temperature 98.1 F (36.7 C), resp. rate 18, height 6\' 2"  (1.88 m), weight 81.6 kg (180 lb), SpO2 99 %.Body mass index is 23.11 kg/m.  General Appearance: Casual  Eye Contact:  fair  Speech:  Clear and Coherent  Volume:  Normal  Mood:  Anxious and Dysphoric  Affect:  Blunt and Constricted  Thought Process:  Coherent  Orientation:  Full (Time, Place, and Person)  Thought Content:  Rumination  Suicidal Thoughts:  No  Homicidal Thoughts:  No  Memory:  Immediate;   Fair Recent;   Fair Remote;   Fair  Judgement:  Impaired  Insight:  Lacking  Psychomotor Activity:  Normal  Concentration:  Concentration: Fair and Attention Span: Fair  Recall:  [employer] of Knowledge:  Poor  Language:  Fair  Akathisia:  No  Handed:  Right  AIMS (if indicated):     Assets:  Communication Skills  ADL's:  Intact  Cognition:  WNL  Sleep:  Number of Hours: 6                                                          Treatment Plan Summary: Daily contact with patient to assess and evaluate symptoms and progress in treatment and Medication management   Michael Schmitt is a  46 year old male with history of depression, anxiety, mood instability, and substance abuse admitted for suicidal ideation.   1. Suicidal ideation. The patient is able to contract for safety in the hospital.   2. Mood. He was started on Zoloft. We will stop Zoloft and give Luvox at night. We will continue Seroquel started last night.    3. Benzodiazepine abuse.  Patient minimizing, encourage patient to follow up with AA/NA meetings and substance abuse counselling.  4. Insomnia. He is on trazodone.  5. Anxiety. He is on Vistaril.  6. Metabolic syndrome monitoring. Lipid panel shows elevated cholesterol, LDL and triglycerides. Hemoglobin A1c and TSH are normal. We restarted Lipitor.   6. HTN. We restarted Metoprolol and Lisinopril.   7. Smoking. Nicotine patch is available.  8. Disposition. He will be discharged to home with family.     Orson Slick, MD 05/30/2016, 1:47 PM

## 2016-05-30 NOTE — B[facility] Group Notes (Signed)
Goals Group Date/Time: 05/30/2016 9:00 AM Type of Therapy and Topic: Group Therapy: Goals Group: SMART Goals   Participation Level: Moderate  Description of Group:    The purpose of a daily goals group is to assist and guide patients in setting recovery/wellness-related goals. The objective is to set goals as they relate to the crisis in which they were admitted. Patients will be using SMART goal modalities to set measurable goals. Characteristics of realistic goals will be discussed and patients will be assisted in setting and processing how one will reach their goal. Facilitator will also assist patients in applying interventions and coping skills learned in psycho-education groups to the SMART goal and process how one will achieve defined goal.   Therapeutic Goals:   -Patients will develop and document one goal related to or their crisis in which brought them into treatment.  -Patients will be guided by LCSW using SMART goal setting modality in how to set a measurable, attainable, realistic and time sensitive goal.  -Patients will process barriers in reaching goal.  -Patients will process interventions in how to overcome and successful in reaching goal.   Patient's Goal: PT reports his anxiety has been very high all morning and he is frustrated by his continued inability to get relief from it. His goal, aided by CSW, is to continue to work with his MD to try to find something to help with the anxiety.   Therapeutic Modalities:  Motivational Interviewing  Art gallery manager  SMART goals setting   Lurline Idol, 

## 2016-05-30 NOTE — B[facility] Group Notes (Signed)
[place] LCSW Group Therapy Note  Date/Time: 05/30/16, 0930  Type of Therapy/Topic:  Group Therapy:  Feelings about Diagnosis  Participation Level:  Minimal   Mood: anxious  Description of Group:    This group will allow patients to explore their thoughts and feelings about diagnoses they have received. Patients will be guided to explore their level of understanding and acceptance of these diagnoses. Facilitator will encourage patients to process their thoughts and feelings about the reactions of others to their diagnosis, and will guide patients in identifying ways to discuss their diagnosis with significant others in their lives. This group will be process-oriented, with patients participating in exploration of their own experiences as well as giving and receiving support and challenge from other group members.   Therapeutic Goals: 1. Patient will demonstrate understanding of diagnosis as evidence by identifying two or more symptoms of the disorder:  2. Patient will be able to express two feelings regarding the diagnosis 3. Patient will demonstrate ability to communicate their needs through discussion and/or role plays  Summary of Patient Progress: Pt shared that he feels "cheated" that he has to constantly deal with his depression and anxiety and that it is difficult to be around other people who do not have these problems.  He confessed to thinking about "what's the point of life?" when he has to live like this.  Pt attended morning meeting and was very engaged but left the group session early and had participated less. CSW later asked him in the hallway if he was OK and he said he could not stay in group any longer due to his anxiety leve.        Therapeutic Modalities:   Cognitive Behavioral Therapy Brief Therapy Feelings Identification   Lurline Idol, LCSW

## 2016-05-30 NOTE — Plan of Care (Signed)
Problem: Coping: Goal: Ability to verbalize frustrations and anger appropriately will improve Outcome: Progressing Patient verbalize frustration to staff.

## 2016-05-30 NOTE — Progress Notes (Signed)
Recreation Therapy Notes  INPATIENT RECREATION THERAPY ASSESSMENT  Patient Details Name: Michael Schmitt MRN: [id number] DOB: 08-10-1970 Today's Date: 05/30/2016  Patient Stressors: Other (Comment) (2 DWIs last year and cannot deal with it has court coming up)  Coping Skills:   Isolate, Arguments, Substance Abuse, Avoidance, Exercise, Talking, Music, Other (Comment) (Deep breathing)  Personal Challenges: Communication, Concentration, Decision-Making, Expressing Yourself, Problem-Solving, Self-Esteem/Confidence, Social Interaction, Stress Management, Substance Abuse, Time Management, Trusting Others  Leisure Interests (2+):   ("I can't think of any")  Awareness of Community Resources:  No  Community Resources:     Current Use:    If no, Barriers?:    Patient Strengths:  "I can't thik of any"  Patient Identified Areas of Improvement:  Want to get better  Current Recreation Participation:  Nothing  Patient Goal for Hospitalization:  To get better  [place] of Residence:  [place] of Residence:  [place]   Current SI (including self-harm):  Yes  Current HI:  No  Consent to Intern Participation: N/A   Leonette Monarch, LRT/CTRS 05/30/2016, 4:07 PM

## 2016-05-30 NOTE — Progress Notes (Signed)
Pt complaining of diarrhea this morning, asking for medication. MD informed. Will continue to monitor.

## 2016-05-30 NOTE — Progress Notes (Signed)
Recreation Therapy Notes  Date: 03.06.18 Time: 3:00 pm Location: Craft Room  Group Topic: Self-expression  Goal Area(s) Addresses:  Patient will be able to identify a color that represents each emotion. Patient will verbalize benefit of using art as a means of self-expression. Patient will verbalize one emotion experienced while participating in activity.  Behavioral Response: Attentive, Interactive  Intervention: The Colors Within Me  Activity: Patients were given a blank face worksheet and were instructed pick a color for each emotion they were feeling and show on the worksheet how much of the emotion they were feeling.  Education: LRT educated patients on other forms of self-expression.  Education Outcome: In group clarification offered  Clinical Observations/Feedback: Patient picked a color for each emotion and showed on the worksheet how much of that emotion he was feeling. Patient contributed to group discussion by stating what emotions he was feeling, what he can do to feel more positive emotions, and what emotions he felt during group.  Leonette Monarch, LRT/CTRS 05/30/2016 3:54 PM

## 2016-05-31 DIAGNOSIS — F332 Major depressive disorder, recurrent severe without psychotic features: Principal | ICD-10-CM

## 2016-05-31 MED ORDER — QUETIAPINE FUMARATE 25 MG PO TABS
25.0000 mg | ORAL_TABLET | Freq: Three times a day (TID) | ORAL | Status: DC
  Administered 2016-05-31 – 2016-06-02 (×8): 25 mg via ORAL
  Filled 2016-05-31 (×8): qty 1

## 2016-05-31 NOTE — Progress Notes (Signed)
Pt awake, alert, oriented and up on unit today. Less needy, noted to be complaining less about anxiety and what to do on the unit. Appears less anxious. Reports his day is going "alright," which is voiced improvement, more optimistic.  Does not pace the unit today.  Observed in dayroom socializing with staff/peers appropriately. Attends groups. Pt is medication compliant. Denies SI/HI/AVH. Pt is able to voice coping skills in which he has developed while being here on the unit. Support and encouragement provided. Medications administered as ordered with education. Safety maintained with every 15 minute checks. Will continue to monitor.

## 2016-05-31 NOTE — Progress Notes (Signed)
[facility] MD Progress Note  05/31/2016 1:25 PM Michael Schmitt  MRN:  [id number] Subjective:    05/28/2016. Patient seen in his room. Reports feeling anxious. Denies suicidal thoughts. Compliant with Zoloft , tolerating well. Slept well, good appetite.  Per nursing, patient presenting with anxiety, requesting ativan, was told he has vistaril and given strategies to help with his anxiety. Patient not attending groups.  05/29/2016. Michael Schmitt complains of feeling anxious and confused. He is under considerable stress from pending legal charges. He knows he will have to go to jail and this would be unbearable. His charges are related to drinking. He is very pessimistic and MEDICATIONS and believes that on the Klonopin is helpful. He was explained that Klonopin is not appropriate for someone with substance abuse history. He minimizes his substance abuse problems and is not interested in treatment. He started participating in groups yesterday. There are no somatic complaints.  05/30/2016. Michael Schmitt has multiple complaints today. He slept poorly, he is very anxious, he has diarrhea, he is confused, he is very depressed, his thoughts are racing, he had a bad dream last night, he woke up with a racing heart this morning. Yesterday he told me that he was very pessimistic about any pharmacotherapy but I was called last night to prescribe Seroquel for sleep. The patient also tells me that 60 mg of Remeron was helpful for insomnia. Some of his medications were prescribed by his primary care provider and they include Celexa, Prozac, Depakote, Lexapro. Seroquel comes from [name]. The patient used to get clonazepam from Dr. Kasandra Knudsen at [facility]. It is no longer possible at the patient is a drinker. He understands that no benzodiazepines will be given in the hospital. He does not remember what other medicines Dr. Manuela Schwartz prescribes.  05/31/2016. Michael Schmitt met with the treatment team today. He is having a "better day". He seems less  anxious and not as so somatic. He sept better. There are no side effects of medications. He participates in programming. He is out of his room and visible in the milieu. No more pacing.  Per nursing: Patient remains anxious and restless. Walking up/down the halls. Limited interaction with peers and staff. Med compliant. No PRNs given. Med adm w/education. Safety maintained with q 15 min checks.   Principal Problem: Severe recurrent major depression without psychotic features ([place]) Diagnosis:   Patient Active Problem List   Diagnosis Date Noted  . Phencyclidine (PCP) use disorder, severe, dependence ([place]) [F16.20] 05/28/2016  . Severe recurrent major depression without psychotic features ([place]) [F33.2] 05/26/2016  . Alcohol abuse [F10.10] 05/12/2016  . Substance induced mood disorder ([place]) [F19.94] 05/12/2016  . Sedative, hypnotic or anxiolytic use disorder, severe, dependence ([place]) [F13.20] 05/12/2016  . Ventricular fibrillation ([place]) [I49.01] 04/05/2016  . 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 Generalized anxiety disorder [F41.1] 08/11/2015  . Major depressive disorder, recurrent episode, moderate with anxious distress ([place]) [F33.1] 06/21/2015  . NSTEMI (non-ST elevated myocardial infarction) ([place]) [I21.4] 06/20/2015  . Acute ST elevation myocardial infarction (STEMI) involving left anterior descending (LAD) coronary artery ([place]) [I21.02] 01/01/2015  . Acute MI, inferoposterior wall ([place]) [I21.19] 01/01/2015  . Common bile duct dilation [K83.8] 10/06/2014  . Right lower quadrant abdominal pain [R10.31] 10/06/2014  . Loss of weight [R63.4] 10/06/2014  . Cervicogenic headache [R51] 07/24/2014  . Cervical spondylosis without myelopathy [M47.812] 07/24/2014  . Clinical depression [F32.9] 04/15/2013  . Acid reflux [K21.9] 04/15/2013  . BP (high blood pressure) [I10] 04/15/2013  . Encounter  for other preprocedural examination [Z01.818] 04/15/2013  . Nasal obstruction [J34.89] 02/12/2013   . Inflamed nasal mucosa [J31.0] 02/12/2013  . Sinus infection [J32.9] 02/12/2013  . Gastric catarrh [K29.70] 03/01/2011  . Allergic rhinitis [J30.9] 10/20/2010  . Airway hyperreactivity [J45.909] 10/20/2010  . Anxiety, generalized [F41.1] 10/20/2010  . Cervical pain [M54.2] 10/20/2010  . Tobacco use disorder [F17.200] 10/20/2010   Total Time spent with patient: 20 minutes  Past Psychiatric History:  Patient has a history of depression. Has been prescribed multiple medicines with partial to no improvement. He has recently been noncompliant with his outpatient provider. Chronic anxiety symptoms. He has been offered ECT in the past but did not follow through with full course. Unclear if it would be of any benefit to him. No history of suicide attempts or violence  Past Medical History:  Past Medical History:  Diagnosis Date  . Anxiety   . Anxiety   . Arthritis    cerv. stenosis, spondylosis, HNP- lower back , has been followed in pain clinic, has  had injection s in cerv. area  . Blood dyscrasia    told that when he was young he was a" free bleeder"  . CAD (coronary artery disease)   . Cervical spondylosis without myelopathy 07/24/2014  . Cervicogenic headache 07/24/2014  . Chronic kidney disease    renal calculi- passed spontaneously  . Depression   . Diabetes mellitus without complication ([place])   . Fatty liver   . GERD (gastroesophageal reflux disease)   . Headache(784.0)   . Hyperlipidemia   . Hypertension   . Mental disorder   . MI, old   . RLS (restless legs syndrome)    detected on sleep study  . Shortness of breath   . Ventricular fibrillation ([place]) 04/05/2016    Past Surgical History:  Procedure Laterality Date  . ANTERIOR CERVICAL DECOMP/DISCECTOMY FUSION  11/13/2011   Procedure: ANTERIOR CERVICAL DECOMPRESSION/DISCECTOMY FUSION 1 LEVEL;  Surgeon: [name], MD;  Location: [facility];  Service: Neurosurgery;  Laterality: N/A;  Anterior Cervical  Decompression/discectomy Fusion. Cervical three-four.  Marland Kitchen CARDIAC CATHETERIZATION N/A 01/01/2015   Procedure: Left Heart Cath and Coronary Angiography;  Surgeon: [name], MD;  Location: [facility];  Service: Cardiovascular;  Laterality: N/A;  . CARDIAC CATHETERIZATION N/A 04/05/2016   Procedure: Left Heart Cath and Coronary Angiography;  Surgeon: Belva Crome, MD;  Location: [place] [facility];  Service: Cardiovascular;  Laterality: N/A;  . CARDIAC CATHETERIZATION N/A 04/05/2016   Procedure: Coronary Stent Intervention;  Surgeon: Belva Crome, MD;  Location: [place] [facility];  Service: Cardiovascular;  Laterality: N/A;  . CARDIAC CATHETERIZATION N/A 04/05/2016   Procedure: Intravascular Ultrasound/IVUS;  Surgeon: Belva Crome, MD;  Location: [place] [facility];  Service: Cardiovascular;  Laterality: N/A;  . NASAL SINUS SURGERY     2005   Family History:  Family History  Problem Relation Age of Onset  . Prostate cancer Father   . Hypertension Mother   . Kidney Stones Mother   . Anxiety disorder Mother   . Depression Mother   . COPD Sister   . Hypertension Sister   . Diabetes Sister   . Depression Sister   . Anxiety disorder Sister   . Seizures Sister    Family Psychiatric  History: unknown Social History:  History  Alcohol Use  . 0.0 oz/week    Comment: trying not to drink      History  Drug Use No  Social History   Social History  . Marital status: Widowed    Spouse name: N/A  . Number of children: 3  . Years of education: 12   Occupational History  .      unemployed   Social History Main Topics  . Smoking status: Current Every Day Smoker    Packs/day: 2.00    Years: 17.00    Types: Cigarettes, Cigars  . Smokeless tobacco: Former Systems developer     Comment: occassional snuff   . Alcohol use 0.0 oz/week     Comment: trying not to drink   . Drug use: No  . Sexual activity: Not Currently   Other Topics Concern  . None   Social History Narrative    Patient is right handed.   Patient drinks 2 sodas daily.   Additional Social History:                         Sleep: Fair  Appetite:  Fair  Current Medications: Current Facility-Administered Medications  Medication Dose Route Frequency Provider Last Rate Last Dose  . acetaminophen (TYLENOL) tablet 650 mg  650 mg Oral Q6H PRN Gonzella Lex, MD   650 mg at 05/29/16 1708  . alum & mag hydroxide-simeth (MAALOX/MYLANTA) 200-200-20 MG/5ML suspension 30 mL  30 mL Oral Q4H PRN Gonzella Lex, MD      . atorvastatin (LIPITOR) tablet 40 mg  40 mg Oral q1800 Himabindu Ravi, MD   40 mg at 05/30/16 1641  . fluvoxaMINE (LUVOX) tablet 50 mg  50 mg Oral QHS Wagner Tanzi B Yuki Brunsman, MD   50 mg at 05/30/16 2117  . hydrOXYzine (ATARAX/VISTARIL) tablet 25 mg  25 mg Oral TID PRN Himabindu Ravi, MD   25 mg at 05/31/16 0804  . lisinopril (PRINIVIL,ZESTRIL) tablet 5 mg  5 mg Oral Daily Clovis Fredrickson, MD   5 mg at 05/31/16 0804  . magnesium hydroxide (MILK OF MAGNESIA) suspension 30 mL  30 mL Oral Daily PRN Gonzella Lex, MD      . metoprolol (LOPRESSOR) tablet 50 mg  50 mg Oral BID Clovis Fredrickson, MD   50 mg at 05/31/16 0804  . nicotine (NICODERM CQ - dosed in mg/24 hours) patch 21 mg  21 mg Transdermal Daily Clovis Fredrickson, MD   21 mg at 05/31/16 0806  . QUEtiapine (SEROQUEL XR) 24 hr tablet 100 mg  100 mg Oral QHS Clovis Fredrickson, MD   100 mg at 05/30/16 2117  . QUEtiapine (SEROQUEL) tablet 25 mg  25 mg Oral TID Clovis Fredrickson, MD   25 mg at 05/31/16 1151    Lab Results:  No results found for this or any previous visit (from the past 48 hour(s)).  Blood Alcohol level:  Lab Results  Component Value Date   ETH <5 05/24/2016   ETH <5 96/75/9163    Metabolic Disorder Labs: Lab Results  Component Value Date   HGBA1C 5.6 05/27/2016   MPG 114 05/27/2016   MPG 123 04/05/2016   Lab Results  Component Value Date   PROLACTIN 6.5 05/27/2016   Lab Results   Component Value Date   CHOL 217 (H) 05/27/2016   TRIG 334 (H) 05/27/2016   HDL 32 (L) 05/27/2016   CHOLHDL 6.8 05/27/2016   VLDL 67 (H) 05/27/2016   LDLCALC 118 (H) 05/27/2016   [place] UNABLE TO REPORT DUE TO LIPEMIC INTERFERENCE 04/05/2016    Physical Findings: AIMS:  , ,  ,  ,  CIWA:    COWS:     Musculoskeletal: Strength & Muscle Tone: within normal limits Gait & Station: normal Patient leans: N/A  Psychiatric Specialty Exam: Physical Exam  Nursing note and vitals reviewed. Psychiatric: His speech is normal. His mood appears anxious. He is withdrawn. Cognition and memory are normal. He expresses impulsivity. He expresses suicidal ideation. He expresses suicidal plans.    Review of Systems  Psychiatric/Behavioral: Positive for depression and suicidal ideas.  All other systems reviewed and are negative.   Blood pressure (!) 129/92, pulse 87, temperature 98.1 F (36.7 C), temperature source Oral, resp. rate 18, height _0  (1.88 m), weight 81.6 kg (180 lb), SpO2 98 %.Body mass index is 23.11 kg/m.  General Appearance: Casual  Eye Contact:  fair  Speech:  Clear and Coherent  Volume:  Normal  Mood:  Anxious and Dysphoric  Affect:  Blunt and Constricted  Thought Process:  Coherent  Orientation:  Full (Time, Place, and Person)  Thought Content:  Rumination  Suicidal Thoughts:  No  Homicidal Thoughts:  No  Memory:  Immediate;   Fair Recent;   Fair Remote;   Fair  Judgement:  Impaired  Insight:  Lacking  Psychomotor Activity:  Normal  Concentration:  Concentration: Fair and Attention Span: Fair  Recall:  [employer] of Knowledge:  Poor  Language:  Fair  Akathisia:  No  Handed:  Right  AIMS (if indicated):     Assets:  Communication Skills  ADL's:  Intact  Cognition:  WNL  Sleep:  Number of Hours: 6                                                          Treatment Plan Summary: Daily contact with patient to assess and evaluate  symptoms and progress in treatment and Medication management   Michael Schmitt is a 46 year old male with history of depression, anxiety, mood instability, and substance abuse admitted for suicidal ideation.   1. Suicidal ideation. The patient is able to contract for safety in the hospital.   2. Mood. He was started on Zoloft. We will stop Zoloft and give Luvox at night. We will continue Seroquel started last night.    3. Benzodiazepine abuse.  Patient minimizing, encourage patient to follow up with AA/NA meetings and substance abuse counselling.  4. Insomnia. He is on trazodone.  5. Anxiety. He is on Vistaril. We added low dose Seroquel throughout the day.  6. Metabolic syndrome monitoring. Lipid panel shows elevated cholesterol, LDL and triglycerides. Hemoglobin A1c and TSH are normal. We restarted Lipitor.   6. HTN. We restarted Metoprolol and Lisinopril.   7. Smoking. Nicotine patch is available.  8. Disposition. He will be discharged to home with family.     Orson Slick, MD 05/31/2016, 1:25 PM

## 2016-05-31 NOTE — Plan of Care (Signed)
Problem: Coping: Goal: Ability to verbalize frustrations and anger appropriately will improve Outcome: Not Met (add Reason) Patient remains anxious and restless. Walking up/down the halls. Limited interaction with peers and staff. Med compliant. No PRNs given. Med adm w/education. Safety maintained with q 15 min checks.

## 2016-05-31 NOTE — Plan of Care (Signed)
Problem: Activity: Goal: Interest or engagement in leisure activities will improve Outcome: Progressing Pt noted to be in dayroom most of the day, observed socializing with staff/peers. Attends groups.

## 2016-05-31 NOTE — Plan of Care (Signed)
Problem: [facility] Participation in Recreation Therapeutic Interventions Goal: STG-Patient will demonstrate improved self esteem by identif STG: Self-Esteem - Within 4 treatment sessions, patient will verbalize at least 5 positive affirmation statements in each of 2 treatment sessions to increase self-esteem.  Outcome: Progressing Treatment Session 1; Completed 1 out of 2: At approximately 2:15 pm, LRT met with patient in community room. Patient verbalized 5 positive affirmation statements. Patient reported it felt "good". LRT encouraged patient to continue saying positive affirmation statements.   M , LRT/CTRS 03.07.18 3:16 pm Goal: STG-Patient will identify at least five coping skills for ** STG: Coping Skills - Within 4 treatment sessions, patient will verbalize at least 5 coping skills for substance abuse in each of 2 treatment sessions to decrease substance abuse.  Outcome: Progressing Treatment Session 1; Completed 1 out of 2: At approximately 2:15 pm, LRT met with patient in community room. Patient verbalized 5 coping skills for substance abuse. LRT educated patient on leisure and why it is important to implement it into his schedule. LRT educated and provided patient with blank schedules to help him plan his day and try to avoid using substances. LRT educated patient on healthy support systems.   M , LRT/CTRS 03.07.18 3:18 pm   

## 2016-05-31 NOTE — [facility] Group Notes (Signed)
[place] LCSW Group Therapy   05/31/2016  9:30 am   Type of Therapy: Group Therapy   Participation Level: Active   Participation Quality: Attentive, Sharing and Supportive   Affect: Appropriate    Cognitive: Alert and Oriented   Insight: Developing/Improving and Engaged   Engagement in Therapy: Developing/Improving and Engaged   Modes of Intervention: Clarification, Confrontation, Discussion, Education, Exploration, Limit-setting, Orientation, Problem-solving, Rapport Building, Art therapist, Socialization and Support   Summary of Progress/Problems: The topic for group today was emotional regulation. This group focused on both positive and negative emotion identification and allowed  group members to process ways to identify feelings, regulate negative emotions, and find healthy ways to manage internal/external emotions. Group members were asked to reflect on a time when their reaction to an emotion led to a negative outcome and explored how alternative responses using emotion regulation would have benefited them. Group members were also asked to discuss a time when emotion regulation was utilized when a negative emotion was experienced.  Patient identified emotions that led to hospitalization as feeling isolated. To regulate that negative emotion, he stated coping mechanisms such as prayer and therapy.   Glorious Peach, MSW, LCSWA 05/31/2016, 11:28AM

## 2016-05-31 NOTE — Progress Notes (Signed)
Recreation Therapy Notes  Date: 03.07.18 Time: 1:00 pm Location: Craft Room  Group Topic: Communication  Goal Area(s) Addresses:  Patient will participate in communication activity. Patient will verbalize importance of communication.  Behavioral Response: Attentive, Interactive  Intervention: I Am  Activity: Patients were paired up back-to-back and one patient was given a picture and was instructed to tell the other patient what to draw and afterwards they switched roles. Next they could sit side by side and one patient would tell the other patient what to draw and they switched roles.  Education: LRT educated patients on communication.  Education Outcome: In group clarification offered  Clinical Observations/Feedback: Patient gave directions to peer and listened to directions from peer. Patient contributed to group discussion.  Leonette Monarch, LRT/CTRS 05/31/2016 4:12 PM

## 2016-05-31 NOTE — Tx Team (Addendum)
Interdisciplinary Treatment and Diagnostic Plan Update  05/31/2016 Time of Session: 10:30 AM Michael Schmitt MRN: [id number]  Principal Diagnosis: Severe recurrent major depression without psychotic features [facility])  Secondary Diagnoses: Principal Problem:   Severe recurrent major depression without psychotic features ([place]) Active Problems:   Tobacco use disorder   Sedative, hypnotic or anxiolytic use disorder, severe, dependence ([place])   Phencyclidine (PCP) use disorder, severe, dependence ([place])   Current Medications:  Current Facility-Administered Medications  Medication Dose Route Frequency Provider Last Rate Last Dose  . acetaminophen (TYLENOL) tablet 650 mg  650 mg Oral Q6H PRN Gonzella Lex, MD   650 mg at 05/29/16 1708  . alum & mag hydroxide-simeth (MAALOX/MYLANTA) 200-200-20 MG/5ML suspension 30 mL  30 mL Oral Q4H PRN Gonzella Lex, MD      . atorvastatin (LIPITOR) tablet 40 mg  40 mg Oral q1800 Himabindu Ravi, MD   40 mg at 05/30/16 1641  . fluvoxaMINE (LUVOX) tablet 50 mg  50 mg Oral QHS Jolanta B Pucilowska, MD   50 mg at 05/30/16 2117  . hydrOXYzine (ATARAX/VISTARIL) tablet 25 mg  25 mg Oral TID PRN Himabindu Ravi, MD   25 mg at 05/31/16 0804  . lisinopril (PRINIVIL,ZESTRIL) tablet 5 mg  5 mg Oral Daily Clovis Fredrickson, MD   5 mg at 05/31/16 0804  . magnesium hydroxide (MILK OF MAGNESIA) suspension 30 mL  30 mL Oral Daily PRN Gonzella Lex, MD      . metoprolol (LOPRESSOR) tablet 50 mg  50 mg Oral BID Clovis Fredrickson, MD   50 mg at 05/31/16 0804  . nicotine (NICODERM CQ - dosed in mg/24 hours) patch 21 mg  21 mg Transdermal Daily Clovis Fredrickson, MD   21 mg at 05/31/16 0806  . QUEtiapine (SEROQUEL XR) 24 hr tablet 100 mg  100 mg Oral QHS Clovis Fredrickson, MD   100 mg at 05/30/16 2117  . QUEtiapine (SEROQUEL) tablet 25 mg  25 mg Oral TID Clovis Fredrickson, MD   25 mg at 05/31/16 1004   PTA Medications: Prescriptions Prior to Admission  Medication Sig  Dispense Refill Last Dose  . aspirin EC 81 MG EC tablet Take 1 tablet (81 mg total) by mouth daily. (Patient not taking: Reported on 04/12/2016)   Not Taking at Unknown time  . atorvastatin (LIPITOR) 80 MG tablet Take 1 tablet (80 mg total) by mouth daily at 6 PM. 30 tablet 5 05/13/2016 at unknown  . carvedilol (COREG) 6.25 MG tablet Take 1 tablet (6.25 mg total) by mouth 2 (two) times daily with a meal. 60 tablet 5 Past Week at Unknown time  . chlordiazePOXIDE (LIBRIUM) 25 MG capsule 50 mg 3 times a day x 3 days 50 mg 2 times a day for 3 days 50 mg once a day for 3 days stop 36 capsule 0   . clonazePAM (KLONOPIN) 1 MG tablet Take 1 tablet (1 mg total) by mouth 3 (three) times daily as needed for anxiety. (Patient not taking: Reported on 05/12/2016) 21 tablet 0 Not Taking at Unknown time  . dexlansoprazole (DEXILANT) 60 MG capsule Take 60 mg by mouth daily.   05/13/2016 at unknown  . divalproex (DEPAKOTE ER) 500 MG 24 hr tablet Take 500 mg by mouth 2 (two) times daily.     Marland Kitchen escitalopram (LEXAPRO) 20 MG tablet Take 20 mg by mouth 2 (two) times daily.   Past Week at Unknown time  . FLUoxetine (PROZAC) 20 MG  capsule Take 1 capsule (20 mg total) by mouth at bedtime. (Patient not taking: Reported on 05/14/2016) 7 capsule 0 Not Taking at unknown  . lisinopril (PRINIVIL,ZESTRIL) 5 MG tablet Take 1 tablet (5 mg total) by mouth daily. 30 tablet 0 05/13/2016 at unknown  . metoprolol tartrate (LOPRESSOR) 25 MG tablet Take 25 mg by mouth 2 (two) times daily.   Not Taking at unknown  . mirtazapine (REMERON) 30 MG tablet Take 60 mg by mouth at bedtime.   4 unknown at unknown  . nicotine (NICODERM CQ - DOSED IN MG/24 HOURS) 21 mg/24hr patch Place 1 patch (21 mg total) onto the skin daily. (Patient not taking: Reported on 04/12/2016) 28 patch 0 Not Taking at Unknown time  . nitroGLYCERIN (NITROSTAT) 0.4 MG SL tablet Place 1 tablet (0.4 mg total) under the tongue every 5 (five) minutes x 3 doses as needed for chest pain.  25 tablet 2 pen at prn  . OLANZapine (ZYPREXA) 5 MG tablet Take 1 tablet (5 mg total) by mouth at bedtime. (Patient not taking: Reported on 05/11/2016) 7 tablet 0 Not Taking at Unknown time  . QUEtiapine (SEROQUEL) 100 MG tablet Take 1 tablet (100 mg total) by mouth at bedtime. 30 tablet 0   . ticagrelor (BRILINTA) 90 MG TABS tablet Take 1 tablet (90 mg total) by mouth 2 (two) times daily. 60 tablet 10 05/13/2016 at pm    Patient Stressors: Medication change or noncompliance  Patient Strengths: Capable of independent living [employer] knowledge  Treatment Modalities: Medication Management, Group therapy, Case management,  1 to 1 session with clinician, Psychoeducation, Recreational therapy.   Physician Treatment Plan for Primary Diagnosis: Severe recurrent major depression without psychotic features ([place]) Long Term Goal(s): Improvement in symptoms so as ready for discharge Improvement in symptoms so as ready for discharge   Short Term Goals: Ability to identify changes in lifestyle to reduce recurrence of condition will improve Ability to verbalize feelings will improve Ability to disclose and discuss suicidal ideas Ability to demonstrate self-control will improve Ability to identify and develop effective coping behaviors will improve Ability to maintain clinical measurements within normal limits will improve Compliance with prescribed medications will improve Ability to identify changes in lifestyle to reduce recurrence of condition will improve Ability to verbalize feelings will improve Ability to disclose and discuss suicidal ideas Ability to demonstrate self-control will improve Ability to identify and develop effective coping behaviors will improve Ability to maintain clinical measurements within normal limits will improve Compliance with prescribed medications will improve  Medication Management: Evaluate patient's response, side effects, and tolerance  of medication regimen.  Therapeutic Interventions: 1 to 1 sessions, Unit Group sessions and Medication administration.  Evaluation of Outcomes: Progressing  Physician Treatment Plan for Secondary Diagnosis: Principal Problem:   Severe recurrent major depression without psychotic features ([place]) Active Problems:   Tobacco use disorder   Sedative, hypnotic or anxiolytic use disorder, severe, dependence ([facility])   Phencyclidine (PCP) use disorder, severe, dependence ([place])  Long Term Goal(s): Improvement in symptoms so as ready for discharge Improvement in symptoms so as ready for discharge   Short Term Goals: Ability to identify changes in lifestyle to reduce recurrence of condition will improve Ability to verbalize feelings will improve Ability to disclose and discuss suicidal ideas Ability to demonstrate self-control will improve Ability to identify and develop effective coping behaviors will improve Ability to maintain clinical measurements within normal limits will improve Compliance with prescribed medications will improve Ability to  identify changes in lifestyle to reduce recurrence of condition will improve Ability to verbalize feelings will improve Ability to disclose and discuss suicidal ideas Ability to demonstrate self-control will improve Ability to identify and develop effective coping behaviors will improve Ability to maintain clinical measurements within normal limits will improve Compliance with prescribed medications will improve     Medication Management: Evaluate patient's response, side effects, and tolerance of medication regimen.  Therapeutic Interventions: 1 to 1 sessions, Unit Group sessions and Medication administration.  Evaluation of Outcomes: Progressing   RN Treatment Plan for Primary Diagnosis: Severe recurrent major depression without psychotic features ([place]) Long Term Goal(s): Knowledge of disease and therapeutic regimen to maintain health will  improve  Short Term Goals: Ability to demonstrate self-control, Ability to disclose and discuss suicidal ideas and Ability to identify and develop effective coping behaviors will improve  Medication Management: RN will administer medications as ordered by provider, will assess and evaluate patient's response and provide education to patient for prescribed medication. RN will report any adverse and/or side effects to prescribing provider.  Therapeutic Interventions: 1 on 1 counseling sessions, Psychoeducation, Medication administration, Evaluate responses to treatment, Monitor vital signs and CBGs as ordered, Perform/monitor CIWA, COWS, AIMS and Fall Risk screenings as ordered, Perform wound care treatments as ordered.  Evaluation of Outcomes: Progressing   LCSW Treatment Plan for Primary Diagnosis: Severe recurrent major depression without psychotic features ([place]) Long Term Goal(s): Safe transition to appropriate next level of care at discharge, Engage patient in therapeutic group addressing interpersonal concerns.  Short Term Goals: Engage patient in aftercare planning with referrals and resources, Increase social support and Increase ability to appropriately verbalize feelings  Therapeutic Interventions: Assess for all discharge needs, 1 to 1 time with Social worker, Explore available resources and support systems, Assess for adequacy in community support network, Educate family and significant other(s) on suicide prevention, Complete Psychosocial Assessment, Interpersonal group therapy.  Evaluation of Outcomes: Progressing    Recreational Therapy Treatment Plan for Primary Diagnosis: Severe recurrent major depression without psychotic features ([place]) Long Term Goal(s): Interest or engagement in recreation therapeutic interventions will improve  Short Term Goals: Increase self-esteem, Increase healthy coping skills  Treatment Modalities: Group Therapy and Individual Treatment  Sessions  Therapeutic Interventions: Psychoeducation  Evaluation of Outcomes: Progressing   Progress in Treatment: Attending groups: Yes. Participating in groups: Yes. Taking medication as prescribed: Yes. Toleration medication: Yes. Family/Significant other contact made: Yes, individual(s) contacted:  sister was contacted. Patient understands diagnosis: Yes. Discussing patient identified problems/goals with staff: Yes. Medical problems stabilized or resolved: Yes. Denies suicidal/homicidal ideation: Yes. Issues/concerns per patient self-inventory: No.  New problem(s) identified: No, Describe:  None identified.  New Short Term/Long Term Goal(s): Pt's goal is to return home and find effective coping mechanisms.  Discharge Plan or Barriers: Patient will return home and f/u with [facility].  Reason for Continuation of Hospitalization: Depression Withdrawal symptoms  Estimated Length of Stay: 2 days   Attendees: Patient: Michael Schmitt 05/31/2016 10:50 AM  Physician: Dr. Orson Slick, MD  05/31/2016 10:50 AM  Nursing: Polly Cobia, RN  05/31/2016 10:50 AM  RN Care Manager: 05/31/2016 10:50 AM  Social Worker: Glorious Peach, MSW, LCSW-A 05/31/2016 10:50 AM  Recreational Therapist: Drue Flirt, LRT/CTRS  05/31/2016 10:50 AM  Other:  05/31/2016 10:50 AM  Other:  05/31/2016 10:50 AM  Other: 05/31/2016 10:50 AM    Scribe for Treatment Team: Emilie Rutter, [place] 05/31/2016 10:50 AM

## 2016-06-01 MED ORDER — FLUVOXAMINE MALEATE 50 MG PO TABS
100.0000 mg | ORAL_TABLET | Freq: Every day | ORAL | Status: DC
  Administered 2016-06-01: 100 mg via ORAL
  Filled 2016-06-01: qty 2

## 2016-06-01 MED ORDER — QUETIAPINE FUMARATE ER 300 MG PO TB24
300.0000 mg | ORAL_TABLET | Freq: Every day | ORAL | Status: DC
  Administered 2016-06-01: 300 mg via ORAL
  Filled 2016-06-01: qty 1

## 2016-06-01 NOTE — Plan of Care (Signed)
Problem: Nutrition: Goal: Adequate nutrition will be maintained Outcome: Progressing Pt does eat his meals, but complains of poor appetite.
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
## 2016-06-01 NOTE — Progress Notes (Signed)
D: Pt denies SI/HI/AVH. Pt is pleasant and cooperative, affect is flat bur brightens. Patient's thoughts are organized no bizarre behavior noted. Pt appears less anxious and he is interacting with peers and staff appropriately.  A: Pt was offered support and encouragement. Pt was given scheduled medications. Pt was encouraged to attend groups. Q 15 minute checks were done for safety.  R:Pt attends groups and interacts well with peers and staff. Pt is taking medication. Pt has no complaints.Pt receptive to treatment and safety maintained on unit.

## 2016-06-01 NOTE — BHH Group Notes (Signed)
[place] Group Notes:  (Nursing/MHT/Case Management/Adjunct)  Date:  06/01/2016  Time:  1:52 AM  Type of Therapy:  Group Therapy  Participation Level:  Active  Participation Quality:  Attentive  Affect:  Depressed and Flat  Cognitive:  Alert  Insight:  Appropriate  Engagement in Group:  Engaged  Modes of Intervention:  n/a  Summary of Progress/Problems: Pt stated that his goal was to remain positive throughout the day. Pt stated that he was able to accomplish this until later in the evening. Pt felt like his thoughts begin to overwhelm him the later it became.   Jenetta Downer Leeana Creer 06/01/2016, 1:52 AM

## 2016-06-01 NOTE — BHH Group Notes (Signed)
[place] Group Notes:  (Nursing/MHT/Case Management/Adjunct)  Date:  06/01/2016  Time:  9:56 PM  Type of Therapy:  Evening Wrap-up Group  Participation Level:  Minimal  Participation Quality:  Appropriate  Affect:  Appropriate  Cognitive:  Appropriate  Insight:  Improving  Engagement in Group:  Developing/Improving  Modes of Intervention:  Discussion  Summary of Progress/Problems:  Levonne Spiller 06/01/2016, 9:56 PM

## 2016-06-01 NOTE — BHH Group Notes (Signed)
[place] Group Notes:  (Nursing/MHT/Case Management/Adjunct)  Date:  06/01/2016  Time:  4:59 PM  Type of Therapy:  Psychoeducational Skills  Participation Level:  Active  Participation Quality:  Appropriate and Attentive  Affect:  Appropriate  Cognitive:  Alert, Appropriate and Oriented  Insight:  Appropriate  Engagement in Group:  Engaged  Modes of Intervention:  Activity  Summary of Progress/Problems:  Michael Schmitt 06/01/2016, 4:59 PM

## 2016-06-01 NOTE — BHH Group Notes (Signed)
Goals Group Date/Time: 06/01/2016 9:00 AM Type of Therapy and Topic: Group Therapy: Goals Group: SMART Goals   Participation Level: Moderate  Description of Group:    The purpose of a daily goals group is to assist and guide patients in setting recovery/wellness-related goals. The objective is to set goals as they relate to the crisis in which they were admitted. Patients will be using SMART goal modalities to set measurable goals. Characteristics of realistic goals will be discussed and patients will be assisted in setting and processing how one will reach their goal. Facilitator will also assist patients in applying interventions and coping skills learned in psycho-education groups to the SMART goal and process how one will achieve defined goal.   Therapeutic Goals:   -Patients will develop and document one goal related to or their crisis in which brought them into treatment.  -Patients will be guided by LCSW using SMART goal setting modality in how to set a measurable, attainable, realistic and time sensitive goal.  -Patients will process barriers in reaching goal.  -Patients will process interventions in how to overcome and successful in reaching goal.   Patient's Goal: Pt goal today is "learning how to stop drinking."  CSW spent some time with pt reviewing how to make this a SMART goal.   Therapeutic Modalities:  Motivational Interviewing  Cognitive Behavioral Therapy  Crisis Intervention Model  SMART goals setting   Lurline Idol, LCSW

## 2016-06-01 NOTE — BHH Group Notes (Signed)
[place] LCSW Group Therapy Note  Date/Time: 06/01/16, 0930  Type of Therapy/Topic:  Group Therapy:  Balance in Life  Participation Level:  minimal  Description of Group:    This group will address the concept of balance and how it feels and looks when one is unbalanced. Patients will be encouraged to process areas in their lives that are out of balance, and identify reasons for remaining unbalanced. Facilitators will guide patients utilizing problem- solving interventions to address and correct the stressor making their life unbalanced. Understanding and applying boundaries will be explored and addressed for obtaining  and maintaining a balanced life. Patients will be encouraged to explore ways to assertively make their unbalanced needs known to significant others in their lives, using other group members and facilitator for support and feedback.  Therapeutic Goals: 1. Patient will identify two or more emotions or situations they have that consume much of in their lives. 2. Patient will identify signs/triggers that life has become out of balance:  3. Patient will identify two ways to set boundaries in order to achieve balance in their lives:  4. Patient will demonstrate ability to communicate their needs through discussion and/or role plays  Summary of Patient Progress: Pt was attentive during group but made only a few comments when called on by CSW.  He identified work as an area of his life that was out of balance due to the compete lack of productive activity in his life due to his disability. CSW processed with him other productive things he could engage in that were not paid employment.          Therapeutic Modalities:   Cognitive Behavioral Therapy Solution-Focused Therapy Assertiveness Training  Lurline Idol, [place]

## 2016-06-01 NOTE — Progress Notes (Signed)
Pt awake, alert, oriented and up on unit today. Noted to be out of his room, in dayroom most of the day. Minimal interactions with staff/peers. Pleasant and cooperative on approach. Today pt voices hopelessness in feeling better, voices feeling anxious, unable to see a positive future. Reports poor sleep last night although he did take sleep medications and "only thought about a few things, my mind wasn't racing." Pt is medication complaint. Denies SI/HI/AVH.   Support and encouragement provided. Medications administered as ordered with education. Safety maintained with every 15 minute checks. Will continue to monitor.

## 2016-06-01 NOTE — Plan of Care (Signed)
Problem: [facility] Participation in Recreation Therapeutic Interventions Goal: STG-Patient will demonstrate improved self esteem by identif STG: Self-Esteem - Within 4 treatment sessions, patient will verbalize at least 5 positive affirmation statements in each of 2 treatment sessions to increase self-esteem.  Outcome: Completed/Met Date Met: 06/01/16 Treatment Session 2; Completed 2 out of 2: At approximately 1:55 pm, LRT met with patient in craft room. Patient verbalized 5 positive affirmation statements. Patient reported, "It made me feel good." LRT encouraged patient to continue saying positive affirmation statements.  Leonette Monarch, LRT/CTRS 03.08.18 3:48 pm Goal: STG-Patient will identify at least five coping skills for ** STG: Coping Skills - Within 4 treatment sessions, patient will verbalize at least 5 coping skills for substance abuse in each of 2 treatment sessions to decrease substance abuse.  Outcome: Completed/Met Date Met: 06/01/16 Treatment Session 2; Completed 2 out of 2: At approximately 1:55 pm, LRT met with patient in craft room. Patient verbalized 5 coping skills for substance abuse. LRT encouraged patient to use his coping skills instead of using substances.  Leonette Monarch, LRT/CTRS 03.08.18 3:51 pm

## 2016-06-02 MED ORDER — LISINOPRIL 5 MG PO TABS: 5.0000 mg | ORAL_TABLET | Freq: Every day | ORAL | 1 refills | Status: DC

## 2016-06-02 MED ORDER — ASPIRIN 81 MG PO TBEC: 81.0000 mg | DELAYED_RELEASE_TABLET | Freq: Every day | ORAL | 1 refills | Status: DC

## 2016-06-02 MED ORDER — QUETIAPINE FUMARATE 25 MG PO TABS: 25.0000 mg | ORAL_TABLET | Freq: Three times a day (TID) | ORAL | 1 refills | Status: DC

## 2016-06-02 MED ORDER — NITROGLYCERIN 0.4 MG SL SUBL: 0.4000 mg | SUBLINGUAL_TABLET | SUBLINGUAL | 2 refills | Status: DC | PRN

## 2016-06-02 MED ORDER — HYDROXYZINE HCL 25 MG PO TABS: 25.0000 mg | ORAL_TABLET | Freq: Three times a day (TID) | ORAL | 1 refills | Status: DC | PRN

## 2016-06-02 MED ORDER — QUETIAPINE FUMARATE 300 MG PO TABS: 300.0000 mg | ORAL_TABLET | Freq: Every day | ORAL | 1 refills | Status: DC

## 2016-06-02 MED ORDER — TICAGRELOR 90 MG PO TABS
90.0000 mg | ORAL_TABLET | Freq: Two times a day (BID) | ORAL | Status: DC
  Filled 2016-06-02: qty 1

## 2016-06-02 MED ORDER — CARVEDILOL 6.25 MG PO TABS: 6.2500 mg | ORAL_TABLET | Freq: Two times a day (BID) | ORAL | Status: DC

## 2016-06-02 MED ORDER — FLUVOXAMINE MALEATE 100 MG PO TABS: 100.0000 mg | ORAL_TABLET | Freq: Every day | ORAL | 1 refills | Status: DC

## 2016-06-02 MED ORDER — NITROGLYCERIN 0.4 MG SL SUBL: 0.4000 mg | SUBLINGUAL_TABLET | SUBLINGUAL | Status: DC | PRN

## 2016-06-02 MED ORDER — MELATONIN 5 MG PO TABS
10.0000 mg | ORAL_TABLET | Freq: Every day | ORAL | Status: DC
  Filled 2016-06-02: qty 2

## 2016-06-02 MED ORDER — ATORVASTATIN CALCIUM 20 MG PO TABS: 80.0000 mg | ORAL_TABLET | Freq: Every day | ORAL | Status: DC

## 2016-06-02 MED ORDER — TICAGRELOR 90 MG PO TABS: 90.0000 mg | ORAL_TABLET | Freq: Two times a day (BID) | ORAL | 1 refills | Status: DC

## 2016-06-02 MED ORDER — ASPIRIN EC 81 MG PO TBEC
81.0000 mg | DELAYED_RELEASE_TABLET | Freq: Every day | ORAL | Status: DC
  Administered 2016-06-02: 81 mg via ORAL
  Filled 2016-06-02: qty 1

## 2016-06-02 MED ORDER — MELATONIN 5 MG PO TABS: 10.0000 mg | ORAL_TABLET | Freq: Every day | ORAL | 1 refills | Status: DC

## 2016-06-02 MED ORDER — QUETIAPINE FUMARATE 200 MG PO TABS: 300.0000 mg | ORAL_TABLET | Freq: Every day | ORAL | Status: DC

## 2016-06-02 MED ORDER — ATORVASTATIN CALCIUM 80 MG PO TABS: 80.0000 mg | ORAL_TABLET | Freq: Every day | ORAL | 1 refills | Status: DC

## 2016-06-02 MED ORDER — CARVEDILOL 6.25 MG PO TABS: 6.2500 mg | ORAL_TABLET | Freq: Two times a day (BID) | ORAL | 1 refills | Status: DC

## 2016-06-02 NOTE — Progress Notes (Signed)
  [facility] Adult Case Management Discharge Plan :  Will you be returning to the same living situation after discharge:  Yes,  home with family. At discharge, do you have transportation home?: Yes,  family will pick pt up. Do you have the ability to pay for your medications: Yes,  [employer].  Release of information consent forms completed and in the chart;  Patient's signature needed at discharge.  Patient to Follow up at: Follow-up Information    [facility]. Go on 06/20/2016.   Why:  [facility] your appointment with Dr. Kasandra Knudsen on March 27th at [facility]. Please bring your discharge paperwork to this appointment. If you have any questions or concerns, contact office directly. Contact information: [place] [address] [phone]           Next level of care provider has access to [place] and Suicide Prevention discussed: Yes,  SPE completed with patient and sister.  Have you used any form of tobacco in the last 30 days? (Cigarettes, Smokeless Tobacco, Cigars, and/or Pipes): Yes  Has patient been referred to the Quitline?: Patient refused referral  Patient has been referred for addiction treatment: Pt. refused referral  Emilie Rutter, MSW, LCSW-A 06/02/2016, 2:49 PM

## 2016-06-02 NOTE — Progress Notes (Signed)
Recreation Therapy Notes  INPATIENT RECREATION TR PLAN  Patient Details Name: [name] MRN: [id number] DOB: Jul 20, 1970 Today's Date: 06/02/2016  Rec Therapy Plan Is patient appropriate for Therapeutic Recreation?: Yes Treatment times per week: At least once a week TR Treatment/Interventions: 1:1 session, Group participation (Comment) (Appropriate participation in daily recreational therapy tx)  Discharge Criteria Pt will be discharged from therapy if:: Treatment goals are met, Discharged Treatment plan/goals/alternatives discussed and agreed upon by:: Patient/family  Discharge Summary Short term goals set: See Care Plan Short term goals met: Complete Progress toward goals comments: One-to-one attended Which groups?: Communication, Social skills, Leisure education, Other (Comment) (Self-expression) One-to-one attended: Self-esteem, coping skills Reason goals not met: N/A Therapeutic equipment acquired: None Reason patient discharged from therapy: Discharge from hospital Pt/family agrees with progress & goals achieved: Yes Date patient discharged from therapy: 06/02/16   Leonette Monarch, LRT/CTRS 06/02/2016, 4:41 PM
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
## 2016-06-02 NOTE — BHH Group Notes (Signed)
[place] LCSW Group Therapy Note  Date/Time: 06/02/16, 0930  Type of Therapy and Topic:  Group Therapy:  Feelings around Relapse and Recovery  Participation Level:  Active   Mood: pleasant  Description of Group:    Patients in this group will discuss emotions they experience before and after a relapse. They will process how experiencing these feelings, or avoidance of experiencing them, relates to having a relapse. Facilitator will guide patients to explore emotions they have related to recovery. Patients will be encouraged to process which emotions are more powerful. They will be guided to discuss the emotional reaction significant others in their lives may have to patients' relapse or recovery. Patients will be assisted in exploring ways to respond to the emotions of others without this contributing to a relapse.  Therapeutic Goals: 1. Patient will identify two or more emotions that lead to relapse for them:  2. Patient will identify two emotions that result when they relapse:  3. Patient will identify two emotions related to recovery:  4. Patient will demonstrate ability to communicate their needs through discussion and/or role plays.   Summary of Patient Progress: Pt had trouble identifying feelings that lead to relapse but with CSW direction acknowledged it is often anxiety.  Pt did not participate significantly in the group, but appeared to be engaged throughout.     Therapeutic Modalities:   Cognitive Behavioral Therapy Solution-Focused Therapy Assertiveness Training Relapse Prevention Therapy  Lurline Idol, LCSW

## 2016-06-02 NOTE — Progress Notes (Signed)
Recreation Therapy Notes  Date: 03.09.18 Time: 1:00 pm Location: Craft Room  Group Topic: Social Skills  Goal Area(s) Addresses:  Patient will effectively work with peer towards shared goal. Patient will identify skills used to make activity successful. Patient will identify benefit of using group skills effectively post d/c.  Behavioral Response: Attentive, Interactive  Intervention: [employer]  Activity: Patients were put in groups and given 15 pipe cleaners. Patients were instructed to build a free standing tower with all 15 pipe cleaners. Patients were given 2 minutes to strategize. After about 5 minutes of building, patient were instructed to put their dominant hand behind their backs. After about another 5 minutes of building, patients were instructed to stop talking to each other.  Education: LRT educated patients on healthy support systems.  Education Outcome: Acknowledges education/In group clarification offered  Clinical Observations/Feedback: Patient worked with peers to build tower. Patient used effective communication, problem solving, and teamwork skills. Patient was anxious at the end of the activity and went for a walk. Patient returned to group. Patient did not contribute to group discussion.  Leonette Monarch, LRT/CTRS 06/02/2016 2:05 PM

## 2016-06-02 NOTE — Progress Notes (Signed)
D: Pt denies SI/HI/AVH. Pt is pleasant and cooperative, affect is flat but brightens upon approach. Pt stated appears less anxious and he is interacting with peers and staff appropriately.  A: Pt was offered support and encouragement. Pt was given scheduled medications. Pt was encouraged to attend groups. Q 15 minute checks were done for safety.  R:Pt attends groups and interacts well with peers and staff. Pt is taking medication. Pt has no complaints.Pt receptive to treatment and safety maintained on unit.

## 2016-06-02 NOTE — [facility] Suicide Risk Assessment (Signed)
[facility] Discharge Suicide Risk Assessment   Principal Problem: Severe recurrent major depression without psychotic features [facility]) Discharge Diagnoses:  Patient Active Problem List   Diagnosis Date Noted  . Phencyclidine (PCP) use disorder, severe, dependence ([place]) [F16.20] 05/28/2016  . Severe recurrent major depression without psychotic features ([place]) [F33.2] 05/26/2016  . Alcohol abuse [F10.10] 05/12/2016  . Substance induced mood disorder ([place]) [F19.94] 05/12/2016  . Sedative, hypnotic or anxiolytic use disorder, severe, dependence ([place]) [F13.20] 05/12/2016  . Ventricular fibrillation ([place]) [I49.01] 04/05/2016  . 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 Generalized anxiety disorder [F41.1] 08/11/2015  . Major depressive disorder, recurrent episode, moderate with anxious distress ([place]) [F33.1] 06/21/2015  . NSTEMI (non-ST elevated myocardial infarction) ([place]) [I21.4] 06/20/2015  . Acute ST elevation myocardial infarction (STEMI) involving left anterior descending (LAD) coronary artery ([place]) [I21.02] 01/01/2015  . Acute MI, inferoposterior wall ([place]) [I21.19] 01/01/2015  . Common bile duct dilation [K83.8] 10/06/2014  . Right lower quadrant abdominal pain [R10.31] 10/06/2014  . Loss of weight [R63.4] 10/06/2014  . Cervicogenic headache [R51] 07/24/2014  . Cervical spondylosis without myelopathy [M47.812] 07/24/2014  . Clinical depression [F32.9] 04/15/2013  . Acid reflux [K21.9] 04/15/2013  . BP (high blood pressure) [I10] 04/15/2013  . Encounter for other preprocedural examination [P95.093] 04/15/2013  . Nasal obstruction [J34.89] 02/12/2013  . Inflamed nasal mucosa [J31.0] 02/12/2013  . Sinus infection [J32.9] 02/12/2013  . Gastric catarrh [K29.70] 03/01/2011  . Allergic rhinitis [J30.9] 10/20/2010  . Airway hyperreactivity [J45.909] 10/20/2010  . Anxiety, generalized [F41.1] 10/20/2010  . Cervical pain [M54.2] 10/20/2010  . Tobacco use disorder [F17.200] 10/20/2010    Total  Time spent with patient: 30 minutes  Musculoskeletal: Strength & Muscle Tone: within normal limits Gait & Station: normal Patient leans: N/A  Psychiatric Specialty Exam: Review of Systems  Psychiatric/Behavioral: The patient has insomnia.   All other systems reviewed and are negative.   Blood pressure 128/86, pulse 75, temperature 97.8 F (36.6 C), resp. rate 18, height 6\' 2"  (1.88 m), weight 81.6 kg (180 lb), SpO2 98 %.Body mass index is 23.11 kg/m.  General Appearance: Casual  Eye Contact::  Good  Speech:  Clear and Coherent409  Volume:  Normal  Mood:  Euthymic  Affect:  Appropriate  Thought Process:  Goal Directed and Descriptions of Associations: Intact  Orientation:  Full (Time, Place, and Person)  Thought Content:  WDL  Suicidal Thoughts:  No  Homicidal Thoughts:  No  Memory:  Immediate;   Fair Recent;   Fair Remote;   Fair  Judgement:  Impaired  Insight:  Shallow  Psychomotor Activity:  Normal  Concentration:  Fair  Recall:  [employer] of Knowledge:Fair  Language: Fair  Akathisia:  No  Handed:  Right  AIMS (if indicated):     Assets:  Communication Skills Desire for Improvement Financial Resources/Insurance Housing Physical Health Resilience Social Support  Sleep:  Number of Hours: 6.15  Cognition: WNL  ADL's:  Intact   Mental Status Per Nursing Assessment::   On Admission:     Demographic Factors:  Male, Divorced or widowed and Caucasian  Loss Factors: Legal issues  Historical Factors: Prior suicide attempts, Family history of mental illness or substance abuse and Impulsivity  Risk Reduction Factors:   Responsible for children under 50 years of age, Sense of responsibility to family, Living with another person, especially a relative, Positive social support and Positive therapeutic relationship  Continued Clinical Symptoms:  Depression:   Comorbid alcohol abuse/dependence Impulsivity Insomnia Alcohol/Substance  Abuse/Dependencies  Cognitive Features That Contribute To Risk:  None    Suicide Risk:  Minimal: No identifiable suicidal ideation.  Patients presenting with no risk factors but with morbid ruminations; may be classified as minimal risk based on the severity of the depressive symptoms  Follow-up [place]. Go on 06/20/2016.   Why:  Please arrive to [facility] for your appointment with Dr. Kasandra Knudsen on March 27th at [facility]. Please bring your discharge paperwork to this appointment. If you have any questions or concerns, contact office directly. Contact information: [place] [address] [phone]           Plan Of Care/Follow-up recommendations:  Activity:  As tolerated. Diet:  Low sodium heart healthy. Other:  Keep follow-up appointments.  Orson Slick, MD 06/02/2016, 11:31 AM

## 2016-06-02 NOTE — Plan of Care (Signed)
Problem: Coping: Goal: Ability to verbalize feelings will improve Outcome: Progressing Patient expressed feelings to staff.

## 2016-06-02 NOTE — Tx Team (Signed)
Interdisciplinary Treatment and Diagnostic Plan Update  06/02/2016 Time of Session: 10:30 AM Michael Schmitt MRN: [id number]  Principal Diagnosis: Severe recurrent major depression without psychotic features [facility])  Secondary Diagnoses: Principal Problem:   Severe recurrent major depression without psychotic features ([place]) Active Problems:   Tobacco use disorder   Sedative, hypnotic or anxiolytic use disorder, severe, dependence ([place])   Phencyclidine (PCP) use disorder, severe, dependence ([place])   Current Medications:  Current Facility-Administered Medications  Medication Dose Route Frequency Provider Last Rate Last Dose  . acetaminophen (TYLENOL) tablet 650 mg  650 mg Oral Q6H PRN Gonzella Lex, MD   650 mg at 05/29/16 1708  . alum & mag hydroxide-simeth (MAALOX/MYLANTA) 200-200-20 MG/5ML suspension 30 mL  30 mL Oral Q4H PRN Gonzella Lex, MD      . atorvastatin (LIPITOR) tablet 40 mg  40 mg Oral q1800 Himabindu Ravi, MD   40 mg at 06/01/16 1650  . fluvoxaMINE (LUVOX) tablet 100 mg  100 mg Oral QHS Jolanta B Pucilowska, MD   100 mg at 06/01/16 2142  . hydrOXYzine (ATARAX/VISTARIL) tablet 25 mg  25 mg Oral TID PRN Himabindu Ravi, MD   25 mg at 06/01/16 1153  . lisinopril (PRINIVIL,ZESTRIL) tablet 5 mg  5 mg Oral Daily Clovis Fredrickson, MD   5 mg at 06/02/16 0849  . magnesium hydroxide (MILK OF MAGNESIA) suspension 30 mL  30 mL Oral Daily PRN Gonzella Lex, MD      . Melatonin TABS 10 mg  10 mg Oral QHS Jolanta B Pucilowska, MD      . metoprolol (LOPRESSOR) tablet 50 mg  50 mg Oral BID Clovis Fredrickson, MD   50 mg at 06/02/16 0849  . nicotine (NICODERM CQ - dosed in mg/24 hours) patch 21 mg  21 mg Transdermal Daily Jolanta B Pucilowska, MD   21 mg at 06/02/16 0849  . QUEtiapine (SEROQUEL XR) 24 hr tablet 300 mg  300 mg Oral QHS Jolanta B Pucilowska, MD   300 mg at 06/01/16 2142  . QUEtiapine (SEROQUEL) tablet 25 mg  25 mg Oral TID Clovis Fredrickson, MD   25 mg at 06/02/16  0849   PTA Medications: Prescriptions Prior to Admission  Medication Sig Dispense Refill Last Dose  . aspirin EC 81 MG EC tablet Take 1 tablet (81 mg total) by mouth daily. (Patient not taking: Reported on 04/12/2016)   Not Taking at Unknown time  . atorvastatin (LIPITOR) 80 MG tablet Take 1 tablet (80 mg total) by mouth daily at 6 PM. 30 tablet 5 05/13/2016 at unknown  . carvedilol (COREG) 6.25 MG tablet Take 1 tablet (6.25 mg total) by mouth 2 (two) times daily with a meal. 60 tablet 5 Past Week at Unknown time  . chlordiazePOXIDE (LIBRIUM) 25 MG capsule 50 mg 3 times a day x 3 days 50 mg 2 times a day for 3 days 50 mg once a day for 3 days stop 36 capsule 0   . clonazePAM (KLONOPIN) 1 MG tablet Take 1 tablet (1 mg total) by mouth 3 (three) times daily as needed for anxiety. (Patient not taking: Reported on 05/12/2016) 21 tablet 0 Not Taking at Unknown time  . dexlansoprazole (DEXILANT) 60 MG capsule Take 60 mg by mouth daily.   05/13/2016 at unknown  . divalproex (DEPAKOTE ER) 500 MG 24 hr tablet Take 500 mg by mouth 2 (two) times daily.     Marland Kitchen escitalopram (LEXAPRO) 20 MG tablet Take 20 mg  by mouth 2 (two) times daily.   Past Week at Unknown time  . FLUoxetine (PROZAC) 20 MG capsule Take 1 capsule (20 mg total) by mouth at bedtime. (Patient not taking: Reported on 05/14/2016) 7 capsule 0 Not Taking at unknown  . lisinopril (PRINIVIL,ZESTRIL) 5 MG tablet Take 1 tablet (5 mg total) by mouth daily. 30 tablet 0 05/13/2016 at unknown  . metoprolol tartrate (LOPRESSOR) 25 MG tablet Take 25 mg by mouth 2 (two) times daily.   Not Taking at unknown  . mirtazapine (REMERON) 30 MG tablet Take 60 mg by mouth at bedtime.   4 unknown at unknown  . nicotine (NICODERM CQ - DOSED IN MG/24 HOURS) 21 mg/24hr patch Place 1 patch (21 mg total) onto the skin daily. (Patient not taking: Reported on 04/12/2016) 28 patch 0 Not Taking at Unknown time  . nitroGLYCERIN (NITROSTAT) 0.4 MG SL tablet Place 1 tablet (0.4 mg total)  under the tongue every 5 (five) minutes x 3 doses as needed for chest pain. 25 tablet 2 pen at prn  . OLANZapine (ZYPREXA) 5 MG tablet Take 1 tablet (5 mg total) by mouth at bedtime. (Patient not taking: Reported on 05/11/2016) 7 tablet 0 Not Taking at Unknown time  . QUEtiapine (SEROQUEL) 100 MG tablet Take 1 tablet (100 mg total) by mouth at bedtime. 30 tablet 0   . ticagrelor (BRILINTA) 90 MG TABS tablet Take 1 tablet (90 mg total) by mouth 2 (two) times daily. 60 tablet 10 05/13/2016 at pm    Patient Stressors: Medication change or noncompliance  Patient Strengths: Capable of independent living Curator fund of knowledge  Treatment Modalities: Medication Management, Group therapy, Case management,  1 to 1 session with clinician, Psychoeducation, Recreational therapy.   Physician Treatment Plan for Primary Diagnosis: Severe recurrent major depression without psychotic features ([place]) Long Term Goal(s): Improvement in symptoms so as ready for discharge Improvement in symptoms so as ready for discharge   Short Term Goals: Ability to identify changes in lifestyle to reduce recurrence of condition will improve Ability to verbalize feelings will improve Ability to disclose and discuss suicidal ideas Ability to demonstrate self-control will improve Ability to identify and develop effective coping behaviors will improve Ability to maintain clinical measurements within normal limits will improve Compliance with prescribed medications will improve Ability to identify changes in lifestyle to reduce recurrence of condition will improve Ability to verbalize feelings will improve Ability to disclose and discuss suicidal ideas Ability to demonstrate self-control will improve Ability to identify and develop effective coping behaviors will improve Ability to maintain clinical measurements within normal limits will improve Compliance with prescribed medications will  improve  Medication Management: Evaluate patient's response, side effects, and tolerance of medication regimen.  Therapeutic Interventions: 1 to 1 sessions, Unit Group sessions and Medication administration.  Evaluation of Outcomes: Progressing  Physician Treatment Plan for Secondary Diagnosis: Principal Problem:   Severe recurrent major depression without psychotic features ([place]) Active Problems:   Tobacco use disorder   Sedative, hypnotic or anxiolytic use disorder, severe, dependence ([facility])   Phencyclidine (PCP) use disorder, severe, dependence ([place])  Long Term Goal(s): Improvement in symptoms so as ready for discharge Improvement in symptoms so as ready for discharge   Short Term Goals: Ability to identify changes in lifestyle to reduce recurrence of condition will improve Ability to verbalize feelings will improve Ability to disclose and discuss suicidal ideas Ability to demonstrate self-control will improve Ability to identify and develop effective coping behaviors will  improve Ability to maintain clinical measurements within normal limits will improve Compliance with prescribed medications will improve Ability to identify changes in lifestyle to reduce recurrence of condition will improve Ability to verbalize feelings will improve Ability to disclose and discuss suicidal ideas Ability to demonstrate self-control will improve Ability to identify and develop effective coping behaviors will improve Ability to maintain clinical measurements within normal limits will improve Compliance with prescribed medications will improve     Medication Management: Evaluate patient's response, side effects, and tolerance of medication regimen.  Therapeutic Interventions: 1 to 1 sessions, Unit Group sessions and Medication administration.  Evaluation of Outcomes: Progressing   RN Treatment Plan for Primary Diagnosis: Severe recurrent major depression without psychotic features ([place]) Long  Term Goal(s): Knowledge of disease and therapeutic regimen to maintain health will improve  Short Term Goals: Ability to demonstrate self-control, Ability to disclose and discuss suicidal ideas and Ability to identify and develop effective coping behaviors will improve  Medication Management: RN will administer medications as ordered by provider, will assess and evaluate patient's response and provide education to patient for prescribed medication. RN will report any adverse and/or side effects to prescribing provider.  Therapeutic Interventions: 1 on 1 counseling sessions, Psychoeducation, Medication administration, Evaluate responses to treatment, Monitor vital signs and CBGs as ordered, Perform/monitor CIWA, COWS, AIMS and Fall Risk screenings as ordered, Perform wound care treatments as ordered.  Evaluation of Outcomes: Progressing   LCSW Treatment Plan for Primary Diagnosis: Severe recurrent major depression without psychotic features ([place]) Long Term Goal(s): Safe transition to appropriate next level of care at discharge, Engage patient in therapeutic group addressing interpersonal concerns.  Short Term Goals: Engage patient in aftercare planning with referrals and resources, Increase social support and Increase ability to appropriately verbalize feelings  Therapeutic Interventions: Assess for all discharge needs, 1 to 1 time with Social worker, Explore available resources and support systems, Assess for adequacy in community support network, Educate family and significant other(s) on suicide prevention, Complete Psychosocial Assessment, Interpersonal group therapy.  Evaluation of Outcomes: Progressing    Recreational Therapy Treatment Plan for Primary Diagnosis: Severe recurrent major depression without psychotic features ([place]) Long Term Goal(s): Interest or engagement in recreation therapeutic interventions will improve  Short Term Goals: Increase self-esteem, Increase healthy coping  skills  Treatment Modalities: Group Therapy and Individual Treatment Sessions  Therapeutic Interventions: Psychoeducation  Evaluation of Outcomes: Progressing   Progress in Treatment: Attending groups: Yes. Participating in groups: Yes. Taking medication as prescribed: Yes. Toleration medication: Yes. Family/Significant other contact made: Yes, individual(s) contacted:  sister was contacted. Patient understands diagnosis: Yes. Discussing patient identified problems/goals with staff: Yes. Medical problems stabilized or resolved: Yes. Denies suicidal/homicidal ideation: Yes. Issues/concerns per patient self-inventory: No.  New problem(s) identified: No, Describe:  None identified.  New Short Term/Long Term Goal(s): Pt's goal is to return home and find effective coping mechanisms.  Discharge Plan or Barriers: Patient will return home and f/u with [facility].  Reason for Continuation of Hospitalization: Depression Withdrawal symptoms  Estimated Length of Stay: D/C 06/02/2016  Attendees: Patient: Michael Schmitt 06/02/2016 10:54 AM  Physician: Dr. Orson Slick, MD  06/02/2016 10:54 AM  Nursing: Elige Radon, BSN, RN  06/02/2016 10:54 AM  RN Care Manager: 06/02/2016 10:54 AM  Social Worker: Glorious Peach, MSW, LCSW-A 06/02/2016 10:54 AM  Recreational Therapist: Drue Flirt, LRT/CTRS  06/02/2016 10:54 AM  Other:  06/02/2016 10:54 AM  Other:  06/02/2016 10:54 AM  Other: 06/02/2016 10:54 AM    Scribe for  Treatment Team: Emilie Rutter, Latanya Presser 06/02/2016 10:54 AM

## 2016-06-02 NOTE — Progress Notes (Signed)
Recreation Therapy Notes  (Late Entry for 03.08.18)  Date: 03.08.18 Time: 1:00 pm Location: Craft Room  Group Topic: Leisure Education  Goal Area(s) Addresses:  Patient will identify activities for each letter of the alphabet. Patient will verbalize ability to integrate positive leisure into life post d/c. 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  Behavioral Response: Attentive, Interactive  Intervention: Leisure Alphabet  Activity: Patients were given a Leisure Air traffic controller and were instructed to write healthy leisure activities for each letter of the alphabet.  Education: LRT educated patients on what they need to participate in leisure.  Education Outcome: In group clarification offered   Clinical Observations/Feedback: Patient wrote healthy leisure activities. Patient contributed to group discussion by stating some of his healthy leisure activities.  Leonette Monarch, LRT/CTRS 06/02/2016 8:20 AM

## 2016-06-02 NOTE — Progress Notes (Signed)
In review it appears this patients assessment was not completed. LCSW will submit a late submission. Information was collected by LCSW on March 4th while in the ED.  [employer] LCSW [phone]

## 2016-06-03 ENCOUNTER — Encounter: Payer: Self-pay | Admitting: Emergency Medicine

## 2016-06-03 ENCOUNTER — Emergency Department
Admission: EM | Admit: 2016-06-03 | Discharge: 2016-06-03 | Disposition: A | Discharge status: Home / Self Care | Payer: Medicare Other | Source: Home / Self Care | Attending: Emergency Medicine | Admitting: Emergency Medicine

## 2016-06-03 ENCOUNTER — Emergency Department
Admission: EM | Admit: 2016-06-03 | Discharge: 2016-06-05 | Disposition: A | Discharge status: Home / Self Care | Payer: Medicare Other | Attending: Emergency Medicine | Admitting: Emergency Medicine

## 2016-06-03 DIAGNOSIS — I252 Old myocardial infarction: Secondary | ICD-10-CM

## 2016-06-03 DIAGNOSIS — N189 Chronic kidney disease, unspecified: Secondary | ICD-10-CM

## 2016-06-03 DIAGNOSIS — I10 Essential (primary) hypertension: Secondary | ICD-10-CM | POA: Diagnosis not present

## 2016-06-03 DIAGNOSIS — F32A Depression, unspecified: Secondary | ICD-10-CM

## 2016-06-03 DIAGNOSIS — F419 Anxiety disorder, unspecified: Secondary | ICD-10-CM

## 2016-06-03 DIAGNOSIS — E119 Type 2 diabetes mellitus without complications: Secondary | ICD-10-CM | POA: Insufficient documentation

## 2016-06-03 DIAGNOSIS — F17290 Nicotine dependence, other tobacco product, uncomplicated: Secondary | ICD-10-CM

## 2016-06-03 DIAGNOSIS — R45851 Suicidal ideations: Secondary | ICD-10-CM | POA: Diagnosis present

## 2016-06-03 DIAGNOSIS — Z79899 Other long term (current) drug therapy: Secondary | ICD-10-CM | POA: Insufficient documentation

## 2016-06-03 DIAGNOSIS — I2510 Atherosclerotic heart disease of native coronary artery without angina pectoris: Secondary | ICD-10-CM | POA: Insufficient documentation

## 2016-06-03 DIAGNOSIS — I129 Hypertensive chronic kidney disease with stage 1 through stage 4 chronic kidney disease, or unspecified chronic kidney disease: Secondary | ICD-10-CM

## 2016-06-03 DIAGNOSIS — F329 Major depressive disorder, single episode, unspecified: Secondary | ICD-10-CM | POA: Diagnosis not present

## 2016-06-03 DIAGNOSIS — Z7982 Long term (current) use of aspirin: Secondary | ICD-10-CM

## 2016-06-03 DIAGNOSIS — F411 Generalized anxiety disorder: Secondary | ICD-10-CM | POA: Diagnosis not present

## 2016-06-03 DIAGNOSIS — F17210 Nicotine dependence, cigarettes, uncomplicated: Secondary | ICD-10-CM | POA: Insufficient documentation

## 2016-06-03 DIAGNOSIS — E1122 Type 2 diabetes mellitus with diabetic chronic kidney disease: Secondary | ICD-10-CM | POA: Insufficient documentation

## 2016-06-03 LAB — URINE DRUG SCREEN, QUALITATIVE (ARMC ONLY)
AMPHETAMINES, UR SCREEN: NOT DETECTED
Barbiturates, Ur Screen: NOT DETECTED
Benzodiazepine, Ur Scrn: NOT DETECTED
CANNABINOID 50 NG, UR ~~LOC~~: NOT DETECTED
Cocaine Metabolite,Ur ~~LOC~~: NOT DETECTED
MDMA (ECSTASY) UR SCREEN: NOT DETECTED
Methadone Scn, Ur: NOT DETECTED
OPIATE, UR SCREEN: NOT DETECTED
PHENCYCLIDINE (PCP) UR S: NOT DETECTED
Tricyclic, Ur Screen: NOT DETECTED

## 2016-06-03 LAB — CBC WITH DIFFERENTIAL/PLATELET
BASOS ABS: 0.1 10*3/uL (ref 0–0.1)
Basophils Relative: 1 %
EOS PCT: 1 %
Eosinophils Absolute: 0.1 10*3/uL (ref 0–0.7)
HEMATOCRIT: 46.1 % (ref 40.0–52.0)
Hemoglobin: 15.2 g/dL (ref 13.0–18.0)
LYMPHS ABS: 1.2 10*3/uL (ref 1.0–3.6)
LYMPHS PCT: 15 %
MCH: 29.8 pg (ref 26.0–34.0)
MCHC: 33 g/dL (ref 32.0–36.0)
MCV: 90.2 fL (ref 80.0–100.0)
MONO ABS: 0.6 10*3/uL (ref 0.2–1.0)
Monocytes Relative: 8 %
NEUTROS ABS: 6.1 10*3/uL (ref 1.4–6.5)
Neutrophils Relative %: 75 %
Platelets: 232 10*3/uL (ref 150–440)
RBC: 5.11 MIL/uL (ref 4.40–5.90)
RDW: 14.7 % — ABNORMAL HIGH (ref 11.5–14.5)
WBC: 8 10*3/uL (ref 3.8–10.6)

## 2016-06-03 LAB — COMPREHENSIVE METABOLIC PANEL
ALT: 46 U/L (ref 17–63)
AST: 37 U/L (ref 15–41)
Albumin: 4.3 g/dL (ref 3.5–5.0)
Alkaline Phosphatase: 93 U/L (ref 38–126)
Anion gap: 8 (ref 5–15)
BUN: 10 mg/dL (ref 6–20)
CHLORIDE: 102 mmol/L (ref 101–111)
CO2: 23 mmol/L (ref 22–32)
CREATININE: 1.02 mg/dL (ref 0.61–1.24)
Calcium: 9.7 mg/dL (ref 8.9–10.3)
GFR calc Af Amer: >60 mL/min (ref >60)
Glucose, Bld: 236 mg/dL — ABNORMAL HIGH (ref 65–99)
POTASSIUM: 3.9 mmol/L (ref 3.5–5.1)
SODIUM: 133 mmol/L — AB (ref 135–145)
Total Bilirubin: 0.2 mg/dL — ABNORMAL LOW (ref 0.3–1.2)
Total Protein: 7.3 g/dL (ref 6.5–8.1)

## 2016-06-03 LAB — ETHANOL

## 2016-06-03 MED ORDER — IBUPROFEN 800 MG PO TABS
ORAL_TABLET | ORAL | Status: AC
  Administered 2016-06-03: 800 mg via ORAL
  Filled 2016-06-03: qty 1

## 2016-06-03 MED ORDER — IBUPROFEN 800 MG PO TABS
800.0000 mg | ORAL_TABLET | Freq: Four times a day (QID) | ORAL | Status: DC | PRN
  Administered 2016-06-03: 800 mg via ORAL

## 2016-06-03 NOTE — ED Notes (Signed)

## 2016-06-03 NOTE — ED Notes (Signed)
Patient states that he was discharged from behavioral unit here yesterday. States that he does not have SI. States that he did not want to be discharged and that being discharged made him anxious. States he is having hallucinations. When questioned regarding type, states that when he woke up this am he could not remember what his sons face looked like at first.

## 2016-06-03 NOTE — ED Provider Notes (Signed)
[facility] Emergency Department Provider Note        Time seen: ----------------------------------------- 3:33 PM on 06/03/2016 -----------------------------------------    I have reviewed the triage vital signs and the nursing notes.   HISTORY  Chief Complaint Suicidal    HPI Michael Schmitt is a 46 y.o. male who presents to ER for possible suicidal ideation. Patient is brought back under involuntary commitment after asking his sister shoot him. Patient states when he was in the psychiatric part of the hospital he felt safe but when he went home he felt like he was unsafe and he was concerned he may hurt himself or someone else.   Past Medical History:  Diagnosis Date  . Anxiety   . Anxiety   . Arthritis    cerv. stenosis, spondylosis, HNP- lower back , has been followed in pain clinic, has  had injection s in cerv. area  . Blood dyscrasia    told that when he was young he was a" free bleeder"  . CAD (coronary artery disease)   . Cervical spondylosis without myelopathy 07/24/2014  . Cervicogenic headache 07/24/2014  . Chronic kidney disease    renal calculi- passed spontaneously  . Depression   . Diabetes mellitus without complication ([place])   . Fatty liver   . GERD (gastroesophageal reflux disease)   . Headache(784.0)   . Hyperlipidemia   . Hypertension   . Mental disorder   . MI, old   . RLS (restless legs syndrome)    detected on sleep study  . Shortness of breath   . Ventricular fibrillation ([place]) 04/05/2016    Patient Active Problem List   Diagnosis Date Noted  . Phencyclidine (PCP) use disorder, severe, dependence ([place]) 05/28/2016  . Severe recurrent major depression without psychotic features ([place]) 05/26/2016  . Alcohol abuse 05/12/2016  . Substance induced mood disorder ([place]) 05/12/2016  . Sedative, hypnotic or anxiolytic use disorder, severe, dependence ([place]) 05/12/2016  . Ventricular fibrillation ([place]) 04/05/2016  .  Coronary stent thrombosis   . Generalized anxiety disorder 08/11/2015  . Major depressive disorder, recurrent episode, moderate with anxious distress ([place]) 06/21/2015  . NSTEMI (non-ST elevated myocardial infarction) ([place]) 06/20/2015  . Acute ST elevation myocardial infarction (STEMI) involving left anterior descending (LAD) coronary artery ([place]) 01/01/2015  . Acute MI, inferoposterior wall ([place]) 01/01/2015  . Common bile duct dilation 10/06/2014  . Right lower quadrant abdominal pain 10/06/2014  . Loss of weight 10/06/2014  . Cervicogenic headache 07/24/2014  . Cervical spondylosis without myelopathy 07/24/2014  . Clinical depression 04/15/2013  . Acid reflux 04/15/2013  . BP (high blood pressure) 04/15/2013  . Encounter for other preprocedural examination 04/15/2013  . Nasal obstruction 02/12/2013  . Inflamed nasal mucosa 02/12/2013  . Sinus infection 02/12/2013  . Gastric catarrh 03/01/2011  . Allergic rhinitis 10/20/2010  . Airway hyperreactivity 10/20/2010  . Anxiety, generalized 10/20/2010  . Cervical pain 10/20/2010  . Tobacco use disorder 10/20/2010    Past Surgical History:  Procedure Laterality Date  . ANTERIOR CERVICAL DECOMP/DISCECTOMY FUSION  11/13/2011   Procedure: ANTERIOR CERVICAL DECOMPRESSION/DISCECTOMY FUSION 1 LEVEL;  Surgeon: [name], MD;  Location: [place];  Service: Neurosurgery;  Laterality: N/A;  Anterior Cervical Decompression/discectomy Fusion. Cervical three-four.  Marland Kitchen CARDIAC CATHETERIZATION N/A 01/01/2015   Procedure: Left Heart Cath and Coronary Angiography;  Surgeon: [name], MD;  Location: [facility];  Service: Cardiovascular;  Laterality: N/A;  . CARDIAC CATHETERIZATION N/A 04/05/2016   Procedure: Left Heart  Cath and Coronary Angiography;  Surgeon: Belva Crome, MD;  Location: [facility];  Service: Cardiovascular;  Laterality: N/A;  . CARDIAC CATHETERIZATION N/A 04/05/2016   Procedure: Coronary Stent Intervention;   Surgeon: Belva Crome, MD;  Location: [place] [facility];  Service: Cardiovascular;  Laterality: N/A;  . CARDIAC CATHETERIZATION N/A 04/05/2016   Procedure: Intravascular Ultrasound/IVUS;  Surgeon: Belva Crome, MD;  Location: [place];  Service: Cardiovascular;  Laterality: N/A;  . NASAL SINUS SURGERY     2005    Allergies Prednisone; Hydrocodone; and Varenicline  Social History Social History  Substance Use Topics  . Smoking status: Current Every Day Smoker    Packs/day: 2.00    Years: 17.00    Types: Cigarettes, Cigars  . Smokeless tobacco: Former Systems developer     Comment: occassional snuff   . Alcohol use 0.0 oz/week     Comment: trying not to drink     Review of Systems Constitutional: Negative for fever. Cardiovascular: Negative for chest pain. Respiratory: Negative for shortness of breath. Gastrointestinal: Negative for abdominal pain, vomiting and diarrhea. Genitourinary: Negative for dysuria. Musculoskeletal: Negative for back pain. Skin: Negative for rash. Neurological: Negative for headaches, focal weakness or numbness. Psychiatric: Positive for anxiety, depression, suicidal ideation  10-point ROS otherwise negative.  ____________________________________________   PHYSICAL EXAM:  VITAL SIGNS: ED Triage Vitals [06/03/16 1437]  Enc Vitals Group     BP (!) 162/86     Pulse Rate (!) 126     Resp 18     Temp 98.5 F (36.9 C)     Temp Source Oral     SpO2 98 %     Weight 180 lb (81.6 kg)     Height 6\' 2"  (1.88 m)     Head Circumference      Peak Flow      Pain Score 0     Pain Loc      Pain Edu?      Excl. in [place]?    Constitutional: Alert and oriented. Well appearing and in no distress. Eyes: Conjunctivae are normal. PERRL. Normal extraocular movements. ENT   Head: Normocephalic and atraumatic.   Nose: No congestion/rhinnorhea.   Mouth/Throat: Mucous membranes are moist.   Neck: No stridor. Cardiovascular: Normal rate, regular  rhythm. No murmurs, rubs, or gallops. Respiratory: Normal respiratory effort without tachypnea nor retractions. Breath sounds are clear and equal bilaterally. No wheezes/rales/rhonchi. Gastrointestinal: Soft and nontender. Normal bowel sounds Musculoskeletal: Nontender with normal range of motion in all extremities. No lower extremity tenderness nor edema. Neurologic:  Normal speech and language. No gross focal neurologic deficits are appreciated.  Skin:  Skin is warm, dry and intact. No rash noted. Psychiatric:Depressed mood and affect ____________________________________________  ED COURSE:  Pertinent labs & imaging results that were available during my care of the patient were reviewed by me and considered in my medical decision making (see chart for details). Patient presents to the ER with depression and suicidal ideation that appears chronic. We will consult with psychiatry for evaluation.   Procedures ____________________________________________  FINAL ASSESSMENT AND PLAN  Depression, suicidal ideation  Plan: Patient presents with persistent depression and suicidal thought. He is stable medically for psychiatric evaluation.   Earleen Newport, MD   Note: This note was generated in part or whole with voice recognition software. Voice recognition is usually quite accurate but there are transcription errors that can and very often do occur. I apologize for any typographical  errors that were not detected and corrected.     Earleen Newport, MD 06/03/16 1535

## 2016-06-03 NOTE — ED Notes (Signed)
Pt given dinner tray with sprite to drink.

## 2016-06-03 NOTE — Progress Notes (Signed)
LCSW met with patient and received verbal consent to speak to all his family members, Call the ACT Team and [employer] to complete a referral for Intensive Care manager who will assist with coordinating community resources as required.  LCSW will proceed with all referrals for this patient and contact family to review strategies for additional support.  [employer]  [phone]

## 2016-06-03 NOTE — ED Notes (Signed)
Pt states that he was dc yesterday and everything was ok until he woke up  during the night and was confused and thought he was seeing things  He then had an argument  With his mother and then he came to the hospital

## 2016-06-03 NOTE — ED Triage Notes (Signed)
Pt discharged from facility this AM. Pt back under IVC with [name] [employer] for asking his sister to shoot him.

## 2016-06-03 NOTE — ED Notes (Signed)
BEHAVIORAL HEALTH ROUNDING Patient sleeping: Yes.   Patient alert and oriented: not applicable SLEEPING Behavior appropriate: Yes.  ; If no, describe: SLEEPING Nutrition and fluids offered: No SLEEPING Toileting and hygiene offered: NoSLEEPING Sitter present: not applicable, Q 15 min safety rounds and observation. Law enforcement present: Yes ODS 

## 2016-06-03 NOTE — BH Assessment (Signed)
Per Heloise Purpura, DNP - patient does not meet criteria for inpatient hospitalization.  Patient is able to follow up with his current provider Dr. Collie Siad with [facility].  Writer informed the ER MD of the disposition.

## 2016-06-03 NOTE — ED Notes (Signed)
BEHAVIORAL HEALTH ROUNDING  Patient sleeping: No.  Patient alert and oriented: yes  Behavior appropriate: Yes. ; If no, describe:  Nutrition and fluids offered: Yes  Toileting and hygiene offered: Yes  Sitter present: not applicable, Q 15 min safety rounds and observation.  Law enforcement present: Yes ODS  

## 2016-06-03 NOTE — ED Notes (Signed)
BEHAVIORAL HEALTH ROUNDING Patient sleeping: Yes.   Patient alert and oriented: yes Behavior appropriate: Yes.  ; If no, describe:  Nutrition and fluids offered: Yes  Toileting and hygiene offered: Yes  Sitter present: not applicable Law enforcement present: Yes  

## 2016-06-03 NOTE — ED Notes (Signed)
After social work informed pt  about his aftercare and provided him with bus tickets for transportation . I went back to offer him lunch before he left and offer him the phone if he needed a ride . He became angry stating he had to stay here and that he could not go home and then he began yelling and using obscenities and so I called extra security and pt was escorted out of unit and out of hospital He refused his vss

## 2016-06-03 NOTE — ED Notes (Signed)
BEHAVIORAL HEALTH ROUNDING Patient sleeping: No. Patient alert and oriented: yes Behavior appropriate: Yes.  ; If no, describe:  Nutrition and fluids offered: Yes  Toileting and hygiene offered: Yes  Sitter present: not applicable Law enforcement present: Yes  

## 2016-06-03 NOTE — BH Assessment (Signed)
Assessment Note  Michael Schmitt is an 46 y.o. male that denies SI/HI/Psychosis/Substance Abuse.    Patient reports that he was discharged from [facility] yesterday.  Patient reports that he has been compliant with taking his psychiatric medication.     Patient reports that he got into an argument with his mother and began yelling.  Patient reports that he has been feeling as if he is in a fog.  Patient reports that he  is not able to describe how he his feeling.   Patient reports that he was feeling confused; therefore, he called the police to take him to the ED.   Per Michael Purpura, DNP - patient does not meet criteria for inpatient hospitalization.  Patient is able to follow up with his current provider Dr. Collie Siad with [facility].   Diagnosis:   Past Medical History:  Past Medical History:  Diagnosis Date  . Anxiety   . Anxiety   . Arthritis    cerv. stenosis, spondylosis, HNP- lower back , has been followed in pain clinic, has  had injection s in cerv. area  . Blood dyscrasia    told that when he was young he was a" free bleeder"  . CAD (coronary artery disease)   . Cervical spondylosis without myelopathy 07/24/2014  . Cervicogenic headache 07/24/2014  . Chronic kidney disease    renal calculi- passed spontaneously  . Depression   . Diabetes mellitus without complication ([place])   . Fatty liver   . GERD (gastroesophageal reflux disease)   . Headache(784.0)   . Hyperlipidemia   . Hypertension   . Mental disorder   . MI, old   . RLS (restless legs syndrome)    detected on sleep study  . Shortness of breath   . Ventricular fibrillation ([place]) 04/05/2016    Past Surgical History:  Procedure Laterality Date  . ANTERIOR CERVICAL DECOMP/DISCECTOMY FUSION  11/13/2011   Procedure: ANTERIOR CERVICAL DECOMPRESSION/DISCECTOMY FUSION 1 LEVEL;  Surgeon: [name], MD;  Location: [place] [facility];  Service: Neurosurgery;  Laterality: N/A;  Anterior Cervical  Decompression/discectomy Fusion. Cervical three-four.  Marland Kitchen CARDIAC CATHETERIZATION N/A 01/01/2015   Procedure: Left Heart Cath and Coronary Angiography;  Surgeon: [name], MD;  Location: [facility];  Service: Cardiovascular;  Laterality: N/A;  . CARDIAC CATHETERIZATION N/A 04/05/2016   Procedure: Left Heart Cath and Coronary Angiography;  Surgeon: Belva Crome, MD;  Location: [place];  Service: Cardiovascular;  Laterality: N/A;  . CARDIAC CATHETERIZATION N/A 04/05/2016   Procedure: Coronary Stent Intervention;  Surgeon: Belva Crome, MD;  Location: [facility];  Service: Cardiovascular;  Laterality: N/A;  . CARDIAC CATHETERIZATION N/A 04/05/2016   Procedure: Intravascular Ultrasound/IVUS;  Surgeon: Belva Crome, MD;  Location: [facility];  Service: Cardiovascular;  Laterality: N/A;  . NASAL SINUS SURGERY     2005    Family History:  Family History  Problem Relation Age of Onset  . Prostate cancer Father   . Hypertension Mother   . Kidney Stones Mother   . Anxiety disorder Mother   . Depression Mother   . COPD Sister   . Hypertension Sister   . Diabetes Sister   . Depression Sister   . Anxiety disorder Sister   . Seizures Sister     Social History:  reports that he has been smoking Cigarettes and Cigars.  He has a 34.00 pack-year smoking history. He has quit using smokeless tobacco.  He reports that he drinks alcohol. He reports that he does not use drugs.  Additional Social History:  Alcohol / Drug Use History of alcohol / drug use?: No history of alcohol / drug abuse Longest period of sobriety (when/how long): NA  CIWA: CIWA-Ar BP: (!) 143/105 Pulse Rate: (!) 108 COWS:    Allergies:  Allergies  Allergen Reactions  . Prednisone Other (See Comments)    Hypertension, makes him feel spacey  . Hydrocodone Other (See Comments)    Headache, irritable  . Varenicline Other (See Comments)    Suicidal thoughts    Home Medications:  (Not in  a hospital admission)  OB/GYN Status:  No LMP for male patient.  General Assessment Data [facility] of Assessment: [facility] ED TTS Assessment: In system Is this a Tele or Face-to-Face Assessment?: Face-to-Face Is this an Initial Assessment or a Re-assessment for this encounter?: Initial Assessment Marital status: Single Maiden name: NA Is patient pregnant?: No Pregnancy Status: No Living Arrangements: Children Can pt return to current living arrangement?: Yes Admission Status: Voluntary Is patient capable of signing voluntary admission?: Yes Referral Source: Self/Family/Friend Insurance type: uhc  Medical Screening Exam ([place]) Medical Exam completed:  (na)  Crisis Care Plan Living Arrangements: Children Legal Guardian:  (NA) Name of Psychiatrist: Dr. Myer Haff Name of Therapist: [place]  Education Status Is patient currently in school?: No Current Grade: NA Highest grade of school patient has completed: NA Name of school: NA Contact person: NA  Risk to self with the past 6 months Suicidal Ideation: No Has patient been a risk to self within the past 6 months prior to admission? : No Suicidal Intent: No Has patient had any suicidal intent within the past 6 months prior to admission? : No Is patient at risk for suicide?: No Suicidal Plan?: No Has patient had any suicidal plan within the past 6 months prior to admission? : No Access to Means: No What has been your use of drugs/alcohol within the last 12 months?: NA Previous Attempts/Gestures: No How many times?:  (Unable to remember) Other Self Harm Risks: NA Triggers for Past Attempts: None known Intentional Self Injurious Behavior: None Family Suicide History: No Recent stressful life event(s): Other (Comment), Conflict (Comment) (Arguing with his mother) Persecutory voices/beliefs?: No Depression: Yes Depression Symptoms: Despondent, Loss of interest in usual pleasures Substance abuse  history and/or treatment for substance abuse?: No Suicide prevention information given to non-admitted patients: Not applicable  Risk to Others within the past 6 months Homicidal Ideation: No Does patient have any lifetime risk of violence toward others beyond the six months prior to admission? : No Thoughts of Harm to Others: No Current Homicidal Intent: No Current Homicidal Plan: No Access to Homicidal Means: No Identified Victim: None Reported History of harm to others?: No Assessment of Violence: None Noted Violent Behavior Description: None Reported Does patient have access to weapons?: No Criminal Charges Pending?: No Describe Pending Criminal Charges: NA Does patient have a court date: No Court Date:  (NA) Is patient on probation?: No  Psychosis Hallucinations: None noted Delusions: None noted  Mental Status Report Appearance/Hygiene: Unremarkable Eye Contact: Fair Motor Activity: Freedom of movement Speech: Logical/coherent Level of Consciousness: Alert Mood: Anxious Affect: Blunted Anxiety Level: Minimal Thought Processes: Coherent, Relevant Judgement: Unimpaired Orientation: Person, Place, Time, Situation, Appropriate for developmental age Obsessive Compulsive Thoughts/Behaviors: None  Cognitive Functioning Concentration: Decreased Memory: Recent Intact, Remote Intact IQ: Average Insight: Fair Impulse Control: Poor Appetite: Fair Weight Loss:  0 Weight Gain: 0 Sleep: Decreased Total Hours of Sleep: 6 Vegetative Symptoms: Staying in bed  ADLScreening [facility]) Patient's cognitive ability adequate to safely complete daily activities?: Yes Patient able to express need for assistance with ADLs?: Yes Independently performs ADLs?: Yes (appropriate for developmental age)  Prior Inpatient Therapy Prior Inpatient Therapy: Yes Prior Therapy Dates: 06-02-2016 Prior Therapy Facilty/Provider(s): [facility] Reason for Treatment:  Alcohol Use Disorder  Prior Outpatient Therapy Prior Outpatient Therapy: Yes Prior Therapy Dates: Current Prior Therapy Facilty/Provider(s): [facility] Reason for Treatment: Anxiety and Depression Does patient have an ACCT team?: No Does patient have Intensive In-House Services?  : No Does patient have [facility] services? : No Does patient have P4CC services?: No  ADL Screening (condition at time of admission) Patient's cognitive ability adequate to safely complete daily activities?: Yes Is the patient deaf or have difficulty hearing?: No Does the patient have difficulty seeing, even when wearing glasses/contacts?: No Does the patient have difficulty concentrating, remembering, or making decisions?: Yes Patient able to express need for assistance with ADLs?: Yes Does the patient have difficulty dressing or bathing?: No Independently performs ADLs?: Yes (appropriate for developmental age) Does the patient have difficulty walking or climbing stairs?: No Weakness of Legs: None Weakness of Arms/Hands: None  Home Assistive Devices/Equipment Home Assistive Devices/Equipment: None    Abuse/Neglect Assessment (Assessment to be complete while patient is alone) Physical Abuse: Denies Verbal Abuse: Denies Sexual Abuse: Denies Exploitation of patient/patient's resources: Denies Self-Neglect: Denies Values / Beliefs Cultural Requests During Hospitalization: None Spiritual Requests During Hospitalization: None Consults Spiritual Care Consult Needed: No Social Work Consult Needed: No Regulatory affairs officer (For Healthcare) Does Patient Have a Medical Advance Directive?: No Would patient like information on creating a medical advance directive?: No - Patient declined    Additional Information 1:1 In Past 12 Months?: No CIRT Risk: No Elopement Risk: No Does patient have medical clearance?: Yes     Disposition: Per Michael Purpura, DNP - patient does not meet criteria for inpatient  hospitalization.  Patient is able to follow up with his current provider Dr. Collie Siad with [facility]. Disposition Initial Assessment Completed for this Encounter: Yes Disposition of Patient: Outpatient treatment Type of outpatient treatment: Adult (Pt can follow up with his current provider.)  On Site Evaluation by:   Reviewed with Physician:    Graciella Freer [place] 06/03/2016 11:21 AM

## 2016-06-03 NOTE — ED Notes (Signed)
Consult with Education officer, museum.

## 2016-06-03 NOTE — ED Provider Notes (Signed)
[facility] Emergency Department Provider Note  Time seen: 9:53 AM  I have reviewed the triage vital signs and the nursing notes.   HISTORY  Chief Complaint Suicidal    HPI Michael Schmitt is a 46 y.o. male with a past medical history of anxiety, diabetes, hypertension, hyperlipidemia, presents the emergency department for psychiatric evaluation. According to the patient he was discharged from psychiatry yesterday has been started on Seroquel. States he takes Seroquel last night, he woke up this morning and could not remember what his son's face look like. States he sleeps on a couch and his son sleeps on the love seat, he was looking at the back of his son's head but he could not remember what his son's face look like. Patient was concerned so he came to the emergency department for evaluation. Denies any SI or HI. Denies alcohol or drug use.  Past Medical History:  Diagnosis Date  . Anxiety   . Anxiety   . Arthritis    cerv. stenosis, spondylosis, HNP- lower back , has been followed in pain clinic, has  had injection s in cerv. area  . Blood dyscrasia    told that when he was young he was a" free bleeder"  . CAD (coronary artery disease)   . Cervical spondylosis without myelopathy 07/24/2014  . Cervicogenic headache 07/24/2014  . Chronic kidney disease    renal calculi- passed spontaneously  . Depression   . Diabetes mellitus without complication ([place])   . Fatty liver   . GERD (gastroesophageal reflux disease)   . Headache(784.0)   . Hyperlipidemia   . Hypertension   . Mental disorder   . MI, old   . RLS (restless legs syndrome)    detected on sleep study  . Shortness of breath   . Ventricular fibrillation ([place]) 04/05/2016    Patient Active Problem List   Diagnosis Date Noted  . Phencyclidine (PCP) use disorder, severe, dependence ([place]) 05/28/2016  . Severe recurrent major depression without psychotic features ([place]) 05/26/2016  . Alcohol abuse  05/12/2016  . Substance induced mood disorder ([place]) 05/12/2016  . Sedative, hypnotic or anxiolytic use disorder, severe, dependence ([place]) 05/12/2016  . Ventricular fibrillation ([place]) 04/05/2016  . Coronary stent thrombosis   . Generalized anxiety disorder 08/11/2015  . Major depressive disorder, recurrent episode, moderate with anxious distress ([place]) 06/21/2015  . NSTEMI (non-ST elevated myocardial infarction) ([place]) 06/20/2015  . Acute ST elevation myocardial infarction (STEMI) involving left anterior descending (LAD) coronary artery ([place]) 01/01/2015  . Acute MI, inferoposterior wall ([place]) 01/01/2015  . Common bile duct dilation 10/06/2014  . Right lower quadrant abdominal pain 10/06/2014  . Loss of weight 10/06/2014  . Cervicogenic headache 07/24/2014  . Cervical spondylosis without myelopathy 07/24/2014  . Clinical depression 04/15/2013  . Acid reflux 04/15/2013  . BP (high blood pressure) 04/15/2013  . Encounter for other preprocedural examination 04/15/2013  . Nasal obstruction 02/12/2013  . Inflamed nasal mucosa 02/12/2013  . Sinus infection 02/12/2013  . Gastric catarrh 03/01/2011  . Allergic rhinitis 10/20/2010  . Airway hyperreactivity 10/20/2010  . Anxiety, generalized 10/20/2010  . Cervical pain 10/20/2010  . Tobacco use disorder 10/20/2010    Past Surgical History:  Procedure Laterality Date  . ANTERIOR CERVICAL DECOMP/DISCECTOMY FUSION  11/13/2011   Procedure: ANTERIOR CERVICAL DECOMPRESSION/DISCECTOMY FUSION 1 LEVEL;  Surgeon: [name], MD;  Location: [place] [facility];  Service: Neurosurgery;  Laterality: N/A;  Anterior Cervical Decompression/discectomy Fusion. Cervical three-four.  Marland Kitchen CARDIAC CATHETERIZATION  N/A 01/01/2015   Procedure: Left Heart Cath and Coronary Angiography;  Surgeon: [name], MD;  Location: [place] CV LAB;  Service: Cardiovascular;  Laterality: N/A;  . CARDIAC CATHETERIZATION N/A 04/05/2016   Procedure: Left Heart Cath and Coronary  Angiography;  Surgeon: Belva Crome, MD;  Location: [place] CV LAB;  Service: Cardiovascular;  Laterality: N/A;  . CARDIAC CATHETERIZATION N/A 04/05/2016   Procedure: Coronary Stent Intervention;  Surgeon: Belva Crome, MD;  Location: [place] CV LAB;  Service: Cardiovascular;  Laterality: N/A;  . CARDIAC CATHETERIZATION N/A 04/05/2016   Procedure: Intravascular Ultrasound/IVUS;  Surgeon: Belva Crome, MD;  Location: [place] CV LAB;  Service: Cardiovascular;  Laterality: N/A;  . NASAL SINUS SURGERY     2005    Prior to Admission medications   Medication Sig Start Date End Date Taking? Authorizing Provider  aspirin 81 MG EC tablet Take 1 tablet (81 mg total) by mouth daily. 06/02/16   Clovis Fredrickson, MD  atorvastatin (LIPITOR) 80 MG tablet Take 1 tablet (80 mg total) by mouth daily at 6 PM. 06/02/16   Jolanta B Pucilowska, MD  carvedilol (COREG) 6.25 MG tablet Take 1 tablet (6.25 mg total) by mouth 2 (two) times daily with a meal. 06/02/16   Jolanta B Pucilowska, MD  fluvoxaMINE (LUVOX) 100 MG tablet Take 1 tablet (100 mg total) by mouth at bedtime. 06/02/16   Clovis Fredrickson, MD  hydrOXYzine (ATARAX/VISTARIL) 25 MG tablet Take 1 tablet (25 mg total) by mouth 3 (three) times daily as needed for anxiety. 06/02/16   Clovis Fredrickson, MD  lisinopril (PRINIVIL,ZESTRIL) 5 MG tablet Take 1 tablet (5 mg total) by mouth daily. 06/02/16   Clovis Fredrickson, MD  Melatonin 5 MG TABS Take 2 tablets (10 mg total) by mouth at bedtime. 06/02/16   Clovis Fredrickson, MD  nitroGLYCERIN (NITROSTAT) 0.4 MG SL tablet Place 1 tablet (0.4 mg total) under the tongue every 5 (five) minutes x 3 doses as needed for chest pain. 06/02/16   Clovis Fredrickson, MD  QUEtiapine (SEROQUEL) 25 MG tablet Take 1 tablet (25 mg total) by mouth 3 (three) times daily. 06/02/16   Clovis Fredrickson, MD  QUEtiapine (SEROQUEL) 300 MG tablet Take 1 tablet (300 mg total) by mouth at bedtime. 06/02/16   Clovis Fredrickson,  MD  ticagrelor (BRILINTA) 90 MG TABS tablet Take 1 tablet (90 mg total) by mouth 2 (two) times daily. 06/02/16   Clovis Fredrickson, MD    Allergies  Allergen Reactions  . Prednisone Other (See Comments)    Hypertension, makes him feel spacey  . Hydrocodone Other (See Comments)    Headache, irritable  . Varenicline Other (See Comments)    Suicidal thoughts    Family History  Problem Relation Age of Onset  . Prostate cancer Father   . Hypertension Mother   . Kidney Stones Mother   . Anxiety disorder Mother   . Depression Mother   . COPD Sister   . Hypertension Sister   . Diabetes Sister   . Depression Sister   . Anxiety disorder Sister   . Seizures Sister     Social History Social History  Substance Use Topics  . Smoking status: Current Every Day Smoker    Packs/day: 2.00    Years: 17.00    Types: Cigarettes, Cigars  . Smokeless tobacco: Former Systems developer     Comment: occassional snuff   . Alcohol use 0.0 oz/week  Comment: trying not to drink     Review of Systems Constitutional: Negative for fever.States anxiety. Cardiovascular: Negative for chest pain. Respiratory: Negative for shortness of breath. Gastrointestinal: Negative for abdominal pain Neurological: Negative for headache 10-point ROS otherwise negative.  ____________________________________________   PHYSICAL EXAM:  VITAL SIGNS: ED Triage Vitals  Enc Vitals Group     BP 06/03/16 0842 (!) 143/105     Pulse Rate 06/03/16 0842 (!) 108     Resp 06/03/16 0842 20     Temp 06/03/16 0842 98.3 F (36.8 C)     Temp Source 06/03/16 0842 Oral     SpO2 06/03/16 0842 98 %     Weight 06/03/16 0843 180 lb (81.6 kg)     Height --      Head Circumference --      Peak Flow --      Pain Score 06/03/16 0843 0     Pain Loc --      Pain Edu? --      Excl. in [place]? --     Constitutional: Alert and oriented. Well appearing and in no distress. Eyes: Normal exam ENT   Head: Normocephalic and atraumatic.    Mouth/Throat: Mucous membranes are moist. Cardiovascular: Normal rate, regular rhythm. No murmur Respiratory: Normal respiratory effort without tachypnea nor retractions. Breath sounds are clear Gastrointestinal: Soft and nontender. No distention. Musculoskeletal: Nontender with normal range of motion in all extremities. Neurologic:  Normal speech and language. No gross focal neurologic deficits Skin:  Skin is warm, dry and intact.  Psychiatric: Mood and affect are normal.   ____________________________________________   INITIAL IMPRESSION / ASSESSMENT AND PLAN / ED COURSE  Pertinent labs & imaging results that were available during my care of the patient were reviewed by me and considered in my medical decision making (see chart for details).  Patient presents the emergency department for psychiatric evaluation. Denies any medical issues besides anxiety. We'll have the patient seen by telemetry psychiatry and TTS. Labs are largely at baseline.  The patient has been seen by psychiatry through telemetry psychiatry. They believe the patient is safe for discharge home. Patient will be discharged with outpatient follow-up.  ____________________________________________   FINAL CLINICAL IMPRESSION(S) / ED DIAGNOSES  Psychiatric evaluation    Harvest Dark, MD 06/03/16 1200

## 2016-06-03 NOTE — ED Notes (Signed)
Pt brought over from main ed , cooperative c/o of confusion and poor thought process

## 2016-06-03 NOTE — Progress Notes (Signed)
LCSW consulted Dr Bary Leriche for additional information on this patient to ensure this patients wellness and care . Dr Bary Leriche has reconciled this patients medications and reports he has not been on Benzos. She recommends that he continue on with his medications plan and follow up with Dr Evelena Peat. She suggested we contact [facility] to see how they assist patients who dont want to leave. She did agree he has anxiety over DUI- but is not suicidal and reported this to Dr Mamie Nick several times this past week.   LCSW did report to [facility] and Charge nurse that this patient meds are reconciled. They will advise Dr Raelyn Ensign  In meeting this patient earlier LCSW and BHU nurse heard patient say  he is not suicidal or homicidal and would never hurt his kids.   LCSW created a plan of care and put it under Plan of care.  [employer] LCSW [phone]

## 2016-06-03 NOTE — Discharge Instructions (Signed)
You have been seen in the emergency department for a  psychiatric concern. You have been evaluated both medically as well as psychiatrically. Please follow-up with your outpatient resources provided. Return to the emergency department for any worsening symptoms, or any thoughts of hurting yourself or anyone else so that we may attempt to help you. 

## 2016-06-03 NOTE — ED Notes (Signed)
ENVIRONMENTAL ASSESSMENT  Potentially harmful objects out of patient reach: Yes.  Personal belongings secured: Yes.  Patient dressed in hospital provided attire only: Yes.  Plastic bags out of patient reach: Yes.  Patient care equipment (cords, cables, call bells, lines, and drains) shortened, removed, or accounted for: Yes.  Equipment and supplies removed from bottom of stretcher: Yes.  Potentially toxic materials out of patient reach: Yes.  Sharps container removed or out of patient reach: Yes.   BEHAVIORAL HEALTH ROUNDING  Patient sleeping: No.  Patient alert and oriented: yes  Behavior appropriate: Yes. ; If no, describe:  Nutrition and fluids offered: Yes  Toileting and hygiene offered: Yes  Sitter present: not applicable, Q 15 min safety rounds and observation.  Law enforcement present: Yes ODS  ED BHU PLACEMENT JUSTIFICATION  Is the patient under IVC or is there intent for IVC: Yes.  Is the patient medically cleared: Yes.  Is there vacancy in the ED BHU: Yes.  Is the population mix appropriate for patient: Yes.  Is the patient awaiting placement in inpatient or outpatient setting: Yes.  Has the patient had a psychiatric consult: Yes.  Survey of unit performed for contraband, proper placement and condition of furniture, tampering with fixtures in bathroom, shower, and each patient room: Yes. ; Findings: All clear  APPEARANCE/BEHAVIOR  calm, cooperative and adequate rapport can be established  NEURO ASSESSMENT  Orientation: time, place and person  Hallucinations: No.None noted (Hallucinations)  Speech: Normal  Gait: normal  RESPIRATORY ASSESSMENT  WNL  CARDIOVASCULAR ASSESSMENT  WNL  GASTROINTESTINAL ASSESSMENT  WNL  EXTREMITIES  WNL  PLAN OF CARE  Provide calm/safe environment. Vital signs assessed TID. ED BHU Assessment once each 12-hour shift. Collaborate with TTS daily or as condition indicates. Assure the ED provider has rounded once each shift. Provide and  encourage hygiene. Provide redirection as needed. Assess for escalating behavior; address immediately and inform ED provider.  Assess family dynamic and appropriateness for visitation as needed: Yes. ; If necessary, describe findings:  Educate the patient/family about BHU procedures/visitation: Yes. ; If necessary, describe findings: Pt is calm and cooperative at this time. Pt understanding and accepting of unit procedures/rules. Will continue to monitor with Q 15 min safety rounds and observation.      

## 2016-06-03 NOTE — ED Notes (Signed)
BEHAVIORAL HEALTH ROUNDING Patient sleeping: Yes.   Patient alert and oriented: yes Behavior appropriate: Yes.  ; If no, describe:  Nutrition and fluids offered: Yes  Toileting and hygiene offered: Yes  Sitter present: not applicable Law enforcement present: Yes  and No

## 2016-06-03 NOTE — ED Notes (Signed)
Pt given warm blanket.

## 2016-06-03 NOTE — ED Notes (Signed)
Hydrologist drew labs, [employer] [facility].

## 2016-06-03 NOTE — ED Notes (Signed)
Resting on guerney on hallway bed by officer. Has taken dinner.

## 2016-06-03 NOTE — ED Triage Notes (Addendum)
Pt to ed with c/o hallucinations.  Pt states he was just released form BHU yesterday, reports he was started on a new medication which makes him see things that are not there.  Pt states he feels confused and disoriented.  Pt alert, accompanied by police.

## 2016-06-03 NOTE — Progress Notes (Signed)
LCSW met with patient and provided 1-1 Support for 30 minutes with patient and reviewed steps this patient needs to follow to ensure his health and wellness.  Step 1) He will [facility] [place] and take his medications as prescribed Step 2) He will contact his OUTPATIENT Provider Dr Sui requesting a referral for a therapist that he can be seen 1x week and additional phone support. Current appointment with Dr Sui is on March 27th/2018 P Step 3) He will access the emergency crisis line or his family supports when he is having an anxiety  Patient will review and access the Transit system as CSW reviewed and provided map and 4 tickets to ensure his transportation issues to an from pharmacy and Dr appointments will be met. Patient repeated I don't want to leave. It was explained to him there is a criteria that has to be met for admission and he is not exhibited behaviors that state he meets inpatient criteria ( acute psychosis)    LCSW [phone] 

## 2016-06-04 DIAGNOSIS — F329 Major depressive disorder, single episode, unspecified: Secondary | ICD-10-CM | POA: Diagnosis not present

## 2016-06-04 MED ORDER — QUETIAPINE FUMARATE 25 MG PO TABS
25.0000 mg | ORAL_TABLET | Freq: Three times a day (TID) | ORAL | Status: DC
  Administered 2016-06-04 – 2016-06-05 (×3): 25 mg via ORAL
  Filled 2016-06-04 (×3): qty 1

## 2016-06-04 MED ORDER — FLUVOXAMINE MALEATE 50 MG PO TABS
100.0000 mg | ORAL_TABLET | Freq: Every day | ORAL | Status: DC
  Administered 2016-06-04: 100 mg via ORAL
  Filled 2016-06-04: qty 2

## 2016-06-04 MED ORDER — HYDROXYZINE HCL 25 MG PO TABS
25.0000 mg | ORAL_TABLET | Freq: Three times a day (TID) | ORAL | Status: DC | PRN
  Administered 2016-06-05 (×2): 25 mg via ORAL
  Filled 2016-06-04 (×2): qty 1

## 2016-06-04 MED ORDER — QUETIAPINE FUMARATE 300 MG PO TABS
300.0000 mg | ORAL_TABLET | Freq: Every day | ORAL | Status: DC
  Administered 2016-06-04: 300 mg via ORAL
  Filled 2016-06-04: qty 1

## 2016-06-04 NOTE — ED Notes (Signed)
BEHAVIORAL HEALTH ROUNDING Patient sleeping: Yes.   Patient alert and oriented: not applicable SLEEPING Behavior appropriate: Yes.  ; If no, describe: SLEEPING Nutrition and fluids offered: No SLEEPING Toileting and hygiene offered: NoSLEEPING Sitter present: not applicable, Q 15 min safety rounds and observation. Law enforcement present: Yes ODS 

## 2016-06-04 NOTE — ED Notes (Addendum)
Pt lying in bed with eyes open.

## 2016-06-04 NOTE — ED Notes (Signed)
Pt given lunch tray.

## 2016-06-04 NOTE — Progress Notes (Signed)
LCSW met with Patient and he is agreeable for a [facility] and [facility] Act Team. Patient signed both consent forms.  LCSW consulted with ED RN and EDP and they will review and administer his medications. EDP reported SOC recommends inpatient treatment at this time.  LCSW awaiting referal package from [facility] and will complete and refax it back.  LCSW called and spoke to his sister Brewster Wolters [phone] (30 minutes on the phone.) LCSW has agreed to meet with pt sister between 1-1:30 today to provide her additional resources ( LCSW will create a care giver package) Explained he was seen by other [facility] and they recommended inpatient however we have no beds at [facility] so he may go to another psychiatric hospital for 3-7 days. He will remain in our ED hallway until then.  Reviewed patients home environment and she supports this pt medications- LCSW went step by step over the original discharge instruction and advised her to have all the new medications that were prescribed to be filled. She is to throw out any old prescriptions that he is not required to take and bring it to the pharmacy ( She agreed to do this today) She will only assist in his medications based on March 09 th 2018 Discharge package medication list.  Explained to her that a referral has been made to get pt a Care Coordinator from [facility] and provided her the number- She will call in 3-5 days if she has not heard from them. Explained at length [facility] act team and provided her their phone number/Crisis line numbers  Reviewed patient safety plan, and family states there is no access to guns or weapons and they will bring him to all his doctor appointments as required.  TTS and LCSW consulted and she will send out information to other Adult Psych Facilities.  [employer] LCSW [phone]

## 2016-06-04 NOTE — ED Provider Notes (Signed)
-----------------------------------------   6:58 AM on 06/04/2016 -----------------------------------------   Blood pressure 133/86, pulse 69, temperature 98 F (36.7 C), temperature source Oral, resp. rate 16, height 6\' 2"  (1.88 m), weight 180 lb (81.6 kg), SpO2 96 %.  The patient had no acute events since last update.  Calm and cooperative at this time.  Patient with frequent ED presentations, has been evaluated by psychiatry multiple times in the last several days and discharged home. However, were unable to find a suitable disposition for the patient at this time. We will reconsult psychiatry. Disposition is pending Psychiatry/Behavioral Medicine team recommendations.     Carrie Mew, MD 06/04/16 [phone]

## 2016-06-04 NOTE — Progress Notes (Signed)
LCSW had family meeting from 1:30-150pm ( 20 minutes)  Resources and Crisis Line numbers and [place] way handouts for both [place] were provided.  Reviewed brochure of [facility] and explained this might be a good resource. Suicide prevention and anxiety reduction workbooks were provided to both patient and sister.  After meeting LCSW, patient and his sister all chatted and safety matters and plans were reviewed this patient is to be connected to [facility] and [facility] application has been completed and faxed. They will assess and contact the family or patient in 3-5 It was verified that [facility] received fax.  Sister reported she bagged up all old medication and found the [facility]  LCSW consulted with TTS, EDP and EDRN and patient will remain in hall until seen by Dr Weber Cooks.  No further needs

## 2016-06-04 NOTE — ED Notes (Signed)
PT IVC/ PENDING PLACEMENT  

## 2016-06-04 NOTE — ED Notes (Signed)
Pt given breakfast tray

## 2016-06-04 NOTE — ED Notes (Signed)
Pt finished eating; lying in hall bed; calm and cooperative

## 2016-06-04 NOTE — BH Assessment (Signed)
Referral information for Psychiatric Hospitalization faxed to;   [facility] [phone] or [phone])  - Per Ivin Booty they do have open beds.  Writer faxed referral to [facility] [phone]), - Per Estill Bamberg, they do have open beds.  Writer faxed referral to  [phone]   [facility] [phone]), -  Per Ena Dawley there are open beds.  Writer faxed referral to [phone]  [facility] [phone]) - Per Larene Beach, they have one adult male bed.  Writer faxed referral to: [phone]  [facility] Ho[facility] [phone]), - Per Danae Chen - no open today; however, a referral can be sent due to open beds tomorrow.  Writer faxed referral to [phone]   [facility] [phone]), - Per Janett Billow no open today; however, a referral can be sent due to open beds tomorrow. Writer faxed referral to [phone]    The following facilities do not have any open beds [facility] [phone]), Per [facility] - no open beds. Mikel Cella [phone]), - Per Roderic Palau - no open beds.     The following facilities did not answer the phone but referrals were faxed to the facility.   [facility] [phone] or [phone] - Voicemail - referral faxed.        [name] [phone]), - No answer writer faxed referral    [facility] [phone] or [phone]), - No answer writer faxed referral    [facility] Bab[facility] [phone]), - No answer Probation officer faxed referral    [facility] [phone]) - No Education officer, environmental faxed referral     [facility] [phone]), - No Education officer, environmental faxed referral

## 2016-06-04 NOTE — Progress Notes (Signed)
   LCSW called the [facility] ACT team and requested them to fax over a referral form for this patient. I will complete and resend to them information/have patient sign a consent form and they will do a home visit next 3-5 days to assess.  Called [facility] to request a Care Manager called Judson Roch and a referral has been made. This process will take 3-5 business days.  LCSW met with patient and had him sign consents forms for [facility] and [facility] Act team. LCSW will contact family to set up family conference if patient is OK with this plan.   Cira Deyoe LCSW

## 2016-06-04 NOTE — ED Notes (Signed)
Sister visited with pt, spoke with sw claudine, then returned with claudine so they could speak with pt together

## 2016-06-04 NOTE — BH Assessment (Signed)
Assessment Note  Michael Schmitt is an 46 y.o. male that reports SI with a plan to have his sister shot him.  Patient denies having access to a gun.  Patient was brought to the ED by IVC.   Patient has a history of mental illness and psychiatric hospitalizations.  Per documentation in the [facility] the patient reports that, "I thought that a red ball was talking to me"    Patient reports that he is not able to control his feeling of anxiety and he is afraid of everything.  Patient was emotional during the assessment.  Patient reports that he has family supports in place.  Patient reports that he resides with his sister, mother and two children ages 10 and 42.  Patient reports that his mother and sister assist him in raising his children.    Patient denies HI and Substance Abuse; however, patient reported to the [facility] a past history of alcohol abuse.    Diagnosis: Anxiety Disorder and Bipolar Disorder with psychotic features  Past Medical History:  Past Medical History:  Diagnosis Date  . Anxiety   . Anxiety   . Arthritis    cerv. stenosis, spondylosis, HNP- lower back , has been followed in pain clinic, has  had injection s in cerv. area  . Blood dyscrasia    told that when he was young he was a" free bleeder"  . CAD (coronary artery disease)   . Cervical spondylosis without myelopathy 07/24/2014  . Cervicogenic headache 07/24/2014  . Chronic kidney disease    renal calculi- passed spontaneously  . Depression   . Diabetes mellitus without complication ([place])   . Fatty liver   . GERD (gastroesophageal reflux disease)   . Headache(784.0)   . Hyperlipidemia   . Hypertension   . Mental disorder   . MI, old   . RLS (restless legs syndrome)    detected on sleep study  . Shortness of breath   . Ventricular fibrillation ([place]) 04/05/2016    Past Surgical History:  Procedure Laterality Date  . ANTERIOR CERVICAL DECOMP/DISCECTOMY FUSION  11/13/2011   Procedure: ANTERIOR CERVICAL  DECOMPRESSION/DISCECTOMY FUSION 1 LEVEL;  Surgeon: [name], MD;  Location: [place] [facility]UR[facility] ORS;  Service: Neurosurgery;  Laterality: N/A;  Anterior Cervical Decompression/discectomy Fusion. Cervical three-four.  Marland Kitchen CARDIAC CATHETERIZATION N/A 01/01/2015   Procedure: Left Heart Cath and Coronary Angiography;  Surgeon: [name], MD;  Location: [facility] CV LAB;  Service: Cardiovascular;  Laterality: N/A;  . CARDIAC CATHETERIZATION N/A 04/05/2016   Procedure: Left Heart Cath and Coronary Angiography;  Surgeon: Belva Crome, MD;  Location: [place] [facility];  Service: Cardiovascular;  Laterality: N/A;  . CARDIAC CATHETERIZATION N/A 04/05/2016   Procedure: Coronary Stent Intervention;  Surgeon: Belva Crome, MD;  Location: [facility] [facility];  Service: Cardiovascular;  Laterality: N/A;  . CARDIAC CATHETERIZATION N/A 04/05/2016   Procedure: Intravascular Ultrasound/IVUS;  Surgeon: Belva Crome, MD;  Location: [place] [facility] LAB;  Service: Cardiovascular;  Laterality: N/A;  . NASAL SINUS SURGERY     2005    Family History:  Family History  Problem Relation Age of Onset  . Prostate cancer Father   . Hypertension Mother   . Kidney Stones Mother   . Anxiety disorder Mother   . Depression Mother   . COPD Sister   . Hypertension Sister   . Diabetes Sister   . Depression Sister   . Anxiety disorder Sister   . Seizures Sister  Social History:  reports that he has been smoking Cigarettes and Cigars.  He has a 34.00 pack-year smoking history. He has quit using smokeless tobacco. He reports that he drinks alcohol. He reports that he does not use drugs.  Additional Social History:  Alcohol / Drug Use History of alcohol / drug use?: No history of alcohol / drug abuse  CIWA: CIWA-Ar BP: 128/78 Pulse Rate: 84 COWS:    Allergies:  Allergies  Allergen Reactions  . Prednisone Other (See Comments)    Hypertension, makes him feel spacey  . Hydrocodone Other (See Comments)    Headache,  irritable  . Varenicline Other (See Comments)    Suicidal thoughts    Home Medications:  (Not in a hospital admission)  OB/GYN Status:  No LMP for male patient.  General Assessment Data Location of Assessment: [facility] ED TTS Assessment: In system Is this a Tele or Face-to-Face Assessment?: Face-to-Face Is this an Initial Assessment or a Re-assessment for this encounter?: Initial Assessment Marital status: Single Maiden name: NA Is patient pregnant?: No Pregnancy Status: No Living Arrangements: Children Can pt return to current living arrangement?: Yes Admission Status: Involuntary Is patient capable of signing voluntary admission?: No Referral Source: Self/Family/Friend Insurance type: [facility]  Medical Screening Exam ([place]) Medical Exam completed:  (NA)  Crisis Care Plan Living Arrangements: Children Legal Guardian:  (NA) Name of Psychiatrist: Dr. Myer Haff Name of Therapist: [place]  Education Status Is patient currently in school?: No Current Grade: NA Highest grade of school patient has completed: NA Name of school: NA Contact person: NA  Risk to self with the past 6 months Suicidal Ideation: Yes-Currently Present Has patient been a risk to self within the past 6 months prior to admission? : Yes Suicidal Intent: No-Not Currently/Within Last 6 Months Has patient had any suicidal intent within the past 6 months prior to admission? : Yes Is patient at risk for suicide?: Yes Suicidal Plan?: Yes-Currently Present Has patient had any suicidal plan within the past 6 months prior to admission? : Yes Specify Current Suicidal Plan: Asked his sister to shot him Access to Means: No What has been your use of drugs/alcohol within the last 12 months?: NA Previous Attempts/Gestures: Yes How many times?:  (Pt unable to remember) Other Self Harm Risks: None Reported Triggers for Past Attempts: None known Intentional Self Injurious Behavior: None Family  Suicide History: No Recent stressful life event(s): Other (Comment) (Psychosis) Persecutory voices/beliefs?: Yes Depression: Yes Depression Symptoms: Despondent, Insomnia, Tearfulness, Isolating, Fatigue, Guilt, Loss of interest in usual pleasures, Feeling worthless/self pity, Feeling angry/irritable Substance abuse history and/or treatment for substance abuse?: No Suicide prevention information given to non-admitted patients: Yes  Risk to Others within the past 6 months Homicidal Ideation: No Does patient have any lifetime risk of violence toward others beyond the six months prior to admission? : No Thoughts of Harm to Others: No Current Homicidal Intent: No Current Homicidal Plan: No Access to Homicidal Means: No Identified Victim: NA History of harm to others?: No Assessment of Violence: None Noted Violent Behavior Description: NA Does patient have access to weapons?: No Criminal Charges Pending?: Yes Describe Pending Criminal Charges: DWI Does patient have a court date: Yes Court Date:  (June 16, 2016) Is patient on probation?: No  Psychosis Hallucinations: Auditory, Visual Delusions: None noted  Mental Status Report Appearance/Hygiene: Unremarkable Eye Contact: Poor Motor Activity: Freedom of movement Speech: Pressured Level of Consciousness: Alert Mood: Depressed, Anxious, Suspicious Affect: Blunted Anxiety Level:  Minimal Thought Processes: Flight of Ideas Judgement: Impaired Orientation: Person, Place, Time, Situation, Appropriate for developmental age Obsessive Compulsive Thoughts/Behaviors: None  Cognitive Functioning Concentration: Decreased Memory: Recent Intact, Remote Intact IQ: Average Insight: Poor Impulse Control: Poor Appetite: Fair Weight Loss: 0 Weight Gain: 0 Sleep: Decreased Total Hours of Sleep: 3 Vegetative Symptoms: Decreased grooming, Staying in bed, Not bathing  ADLScreening [facility] Assessment Services) Patient's cognitive ability  adequate to safely complete daily activities?: Yes Patient able to express need for assistance with ADLs?: Yes Independently performs ADLs?: Yes (appropriate for developmental age)  Prior Inpatient Therapy Prior Inpatient Therapy: Yes Prior Therapy Dates: 06-02-2016 Prior Therapy Facilty/Provider(s): [place] Reason for Treatment: Alcohol Use Disorder  Prior Outpatient Therapy Prior Outpatient Therapy: Yes Prior Therapy Dates: Current Prior Therapy Facilty/Provider(s): [place] Reason for Treatment: Anxiety and Depression Does patient have an ACCT team?: No Does patient have Intensive In-House Services?  : No Does patient have [facility] services? : No Does patient have P4CC services?: No  ADL Screening (condition at time of admission) Patient's cognitive ability adequate to safely complete daily activities?: Yes Is the patient deaf or have difficulty hearing?: No Does the patient have difficulty seeing, even when wearing glasses/contacts?: No Does the patient have difficulty concentrating, remembering, or making decisions?: Yes Patient able to express need for assistance with ADLs?: Yes Does the patient have difficulty dressing or bathing?: No Independently performs ADLs?: Yes (appropriate for developmental age) Does the patient have difficulty walking or climbing stairs?: No Weakness of Legs: None Weakness of Arms/Hands: None  Home Assistive Devices/Equipment Home Assistive Devices/Equipment: None    Abuse/Neglect Assessment (Assessment to be complete while patient is alone) Physical Abuse: Denies Verbal Abuse: Denies Sexual Abuse: Denies Exploitation of patient/patient's resources: Denies Self-Neglect: Denies Values / Beliefs Cultural Requests During Hospitalization: None Spiritual Requests During Hospitalization: None Consults Spiritual Care Consult Needed: No Social Work Consult Needed: No Regulatory affairs officer (For Healthcare) Does  Patient Have a Medical Advance Directive?: No Would patient like information on creating a medical advance directive?: No - Patient declined    Additional Information 1:1 In Past 12 Months?: No CIRT Risk: No Elopement Risk: No Does patient have medical clearance?: Yes     Disposition: Per SOC, Dr. Roderic Ovens - patient meets criteria for inpatient hospitalization.  TTS will seek placement.  Disposition Initial Assessment Completed for this Encounter: Yes Disposition of Patient: Inpatient treatment program Type of inpatient treatment program: Adult Type of outpatient treatment: Adult (Per SOC Dr. Hinton Lovely, [place]onia Blood pt meets inpt criteria)  On Site Evaluation by:   Reviewed with Physician:    Graciella Freer [place] 06/04/2016 2:40 PM

## 2016-06-04 NOTE — ED Notes (Signed)
Pt refused shower. No reason given.

## 2016-06-04 NOTE — ED Notes (Signed)
Pt sister is visiting with pt.

## 2016-06-05 DIAGNOSIS — F411 Generalized anxiety disorder: Secondary | ICD-10-CM | POA: Diagnosis not present

## 2016-06-05 DIAGNOSIS — F329 Major depressive disorder, single episode, unspecified: Secondary | ICD-10-CM | POA: Diagnosis not present

## 2016-06-05 NOTE — ED Notes (Signed)
Pt given cab voucher. Info in patient chart

## 2016-06-05 NOTE — ED Notes (Signed)
BEHAVIORAL HEALTH ROUNDING Patient sleeping: Yes.   Patient alert and oriented: not applicable SLEEPING Behavior appropriate: Yes.  ; If no, describe: SLEEPING Nutrition and fluids offered: No SLEEPING Toileting and hygiene offered: NoSLEEPING Sitter present: not applicable, Q 15 min safety rounds and observation. Law enforcement present: Yes ODS 

## 2016-06-05 NOTE — ED Provider Notes (Signed)
-----------------------------------------   7:49 AM on 06/05/2016 -----------------------------------------   Blood pressure 127/77, pulse 61, temperature 97.9 F (36.6 C), temperature source Oral, resp. rate 18, height 6\' 2"  (1.88 m), weight 180 lb (81.6 kg), SpO2 97 %.  The patient had no acute events since last update.  Calm and cooperative at this time.  Disposition is pending Psychiatry/Behavioral Medicine team recommendations.     Darel Hong, MD 06/05/16 [id number]

## 2016-06-05 NOTE — Consult Note (Signed)
[facility] Face-to-Face Psychiatry Consult   Reason for Consult:  Consult for 46 year old man with a history of anxiety and mood symptoms who came back to the emergency room over the weekend. Referring Physician:  Jimmye Norman Patient Identification: Michael Schmitt MRN:  [id number] Principal Diagnosis: Anxiety, generalized Diagnosis:   Patient Active Problem List   Diagnosis Date Noted  . Phencyclidine (PCP) use disorder, severe, dependence ([place]) [F16.20] 05/28/2016  . Severe recurrent major depression without psychotic features ([place]) [F33.2] 05/26/2016  . Alcohol abuse [F10.10] 05/12/2016  . Substance induced mood disorder ([place]) [F19.94] 05/12/2016  . Sedative, hypnotic or anxiolytic use disorder, severe, dependence ([place]) [F13.20] 05/12/2016  . Ventricular fibrillation ([place]) [I49.01] 04/05/2016  . 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 Generalized anxiety disorder [F41.1] 08/11/2015  . Major depressive disorder, recurrent episode, moderate with anxious distress ([place]) [F33.1] 06/21/2015  . NSTEMI (non-ST elevated myocardial infarction) ([place]) [I21.4] 06/20/2015  . Acute ST elevation myocardial infarction (STEMI) involving left anterior descending (LAD) coronary artery ([place]) [I21.02] 01/01/2015  . Acute MI, inferoposterior wall ([place]) [I21.19] 01/01/2015  . Common bile duct dilation [K83.8] 10/06/2014  . Right lower quadrant abdominal pain [R10.31] 10/06/2014  . Loss of weight [R63.4] 10/06/2014  . Cervicogenic headache [R51] 07/24/2014  . Cervical spondylosis without myelopathy [M47.812] 07/24/2014  . Clinical depression [F32.9] 04/15/2013  . Acid reflux [K21.9] 04/15/2013  . BP (high blood pressure) [I10] 04/15/2013  . Encounter for other preprocedural examination [Z99.357] 04/15/2013  . Nasal obstruction [J34.89] 02/12/2013  . Inflamed nasal mucosa [J31.0] 02/12/2013  . Sinus infection [J32.9] 02/12/2013  . Gastric catarrh [K29.70] 03/01/2011  . Allergic rhinitis [J30.9] 10/20/2010  .  Airway hyperreactivity [J45.909] 10/20/2010  . Anxiety, generalized [F41.1] 10/20/2010  . Cervical pain [M54.2] 10/20/2010  . Tobacco use disorder [F17.200] 10/20/2010    Total Time spent with patient: 1 hour  Subjective:   Michael Schmitt is a 46 y.o. male patient admitted with "I just get so anxious".  HPI:  Patient interviewed. Chart reviewed. Patient known from previous encounters. 46 year old man with a history of anxiety and depression. He was discharged from the hospital on Friday and returned to the emergency room by mid day on Saturday once again complaining of anxiety. Symptoms were the same as what he was having before. Chronic anxiety. Feels sometimes like he just can't cope. Passive suicidal thoughts without any intention or plan or specifics. No homicidal ideation. No psychosis. Says he has not gone back to drinking or using any other drugs.  Medical history: Patient has chronic pain issues.  Social history: Lives with his children and his sister. Doesn't get out of the house much. Gets disability but actually seems to regret it sometimes.  Substance abuse history: History of benzodiazepine abuse and alcohol abuse.  Past Psychiatric History: Multiple prior admissions and presentations to the emergency room. Past history of suicidality. No history of psychosis. History of poor insight as far as his substance abuse goes. Tends to continue to abuse benzodiazepines repeatedly. Just discharged from the hospital the day before re-presenting.  Risk to Self: Suicidal Ideation: Yes-Currently Present Suicidal Intent: No-Not Currently/Within Last 6 Months Is patient at risk for suicide?: Yes Suicidal Plan?: Yes-Currently Present Specify Current Suicidal Plan: Asked his sister to shot him Access to Means: No What has been your use of drugs/alcohol within the last 12 months?: NA How many times?:  (Pt unable to remember) Other Self Harm Risks: None Reported Triggers for Past Attempts:  None known Intentional Self Injurious Behavior:  None Risk to Others: Homicidal Ideation: No Thoughts of Harm to Others: No Current Homicidal Intent: No Current Homicidal Plan: No Access to Homicidal Means: No Identified Victim: NA History of harm to others?: No Assessment of Violence: None Noted Violent Behavior Description: NA Does patient have access to weapons?: No Criminal Charges Pending?: Yes Describe Pending Criminal Charges: DWI Does patient have a court date: Yes Court Date:  (June 16, 2016) Prior Inpatient Therapy: Prior Inpatient Therapy: Yes Prior Therapy Dates: 06-02-2016 Prior Therapy Facilty/Provider(s): [place] Reason for Treatment: Alcohol Use Disorder Prior Outpatient Therapy: Prior Outpatient Therapy: Yes Prior Therapy Dates: Current Prior Therapy Facilty/Provider(s): [place] Reason for Treatment: Anxiety and Depression Does patient have an ACCT team?: No Does patient have Intensive In-House Services?  : No Does patient have [facility] services? : No Does patient have P4CC services?: No  Past Medical History:  Past Medical History:  Diagnosis Date  . Anxiety   . Anxiety   . Arthritis    cerv. stenosis, spondylosis, HNP- lower back , has been followed in pain clinic, has  had injection s in cerv. area  . Blood dyscrasia    told that when he was young he was a" free bleeder"  . CAD (coronary artery disease)   . Cervical spondylosis without myelopathy 07/24/2014  . Cervicogenic headache 07/24/2014  . Chronic kidney disease    renal calculi- passed spontaneously  . Depression   . Diabetes mellitus without complication ([place])   . Fatty liver   . GERD (gastroesophageal reflux disease)   . Headache(784.0)   . Hyperlipidemia   . Hypertension   . Mental disorder   . MI, old   . RLS (restless legs syndrome)    detected on sleep study  . Shortness of breath   . Ventricular fibrillation ([place]) 04/05/2016    Past Surgical  History:  Procedure Laterality Date  . ANTERIOR CERVICAL DECOMP/DISCECTOMY FUSION  11/13/2011   Procedure: ANTERIOR CERVICAL DECOMPRESSION/DISCECTOMY FUSION 1 LEVEL;  Surgeon: [name], MD;  Location: [place] [facility]UR[facility];  Service: Neurosurgery;  Laterality: N/A;  Anterior Cervical Decompression/discectomy Fusion. Cervical three-four.  Marland Kitchen CARDIAC CATHETERIZATION N/A 01/01/2015   Procedure: Left Heart Cath and Coronary Angiography;  Surgeon: [name], MD;  Location: [place] CV LAB;  Service: Cardiovascular;  Laterality: N/A;  . CARDIAC CATHETERIZATION N/A 04/05/2016   Procedure: Left Heart Cath and Coronary Angiography;  Surgeon: Belva Crome, MD;  Location: [place] CV LAB;  Service: Cardiovascular;  Laterality: N/A;  . CARDIAC CATHETERIZATION N/A 04/05/2016   Procedure: Coronary Stent Intervention;  Surgeon: Belva Crome, MD;  Location: [place] CV LAB;  Service: Cardiovascular;  Laterality: N/A;  . CARDIAC CATHETERIZATION N/A 04/05/2016   Procedure: Intravascular Ultrasound/IVUS;  Surgeon: Belva Crome, MD;  Location: [place] CV LAB;  Service: Cardiovascular;  Laterality: N/A;  . NASAL SINUS SURGERY     2005   Family History:  Family History  Problem Relation Age of Onset  . Prostate cancer Father   . Hypertension Mother   . Kidney Stones Mother   . Anxiety disorder Mother   . Depression Mother   . COPD Sister   . Hypertension Sister   . Diabetes Sister   . Depression Sister   . Anxiety disorder Sister   . Seizures Sister    Family Psychiatric  History: Positive for anxiety Social History:  History  Alcohol Use  . 0.0 oz/week    Comment: trying  not to drink      History  Drug Use No    Social History   Social History  . Marital status: Widowed    Spouse name: N/A  . Number of children: 3  . Years of education: 12   Occupational History  .      unemployed   Social History Main Topics  . Smoking status: Current Every Day Smoker    Packs/day: 2.00     Years: 17.00    Types: Cigarettes, Cigars  . Smokeless tobacco: Former Systems developer     Comment: occassional snuff   . Alcohol use 0.0 oz/week     Comment: trying not to drink   . Drug use: No  . Sexual activity: Not Currently   Other Topics Concern  . None   Social History Narrative   Patient is right handed.   Patient drinks 2 sodas daily.   Additional Social History:    Allergies:   Allergies  Allergen Reactions  . Prednisone Other (See Comments)    Hypertension, makes him feel spacey  . Hydrocodone Other (See Comments)    Headache, irritable  . Varenicline Other (See Comments)    Suicidal thoughts    Labs: No results found for this or any previous visit (from the past 48 hour(s)).  Current Facility-Administered Medications  Medication Dose Route Frequency Provider Last Rate Last Dose  . fluvoxaMINE (LUVOX) tablet 100 mg  100 mg Oral QHS Nena Polio, MD   100 mg at 06/04/16 2153  . hydrOXYzine (ATARAX/VISTARIL) tablet 25 mg  25 mg Oral TID PRN Nena Polio, MD   25 mg at 06/05/16 1338  . QUEtiapine (SEROQUEL) tablet 25 mg  25 mg Oral TID Nena Polio, MD   25 mg at 06/05/16 9509  . QUEtiapine (SEROQUEL) tablet 300 mg  300 mg Oral QHS Nena Polio, MD   300 mg at 06/04/16 2142   Current Outpatient Prescriptions  Medication Sig Dispense Refill  . aspirin 81 MG EC tablet Take 1 tablet (81 mg total) by mouth daily. 30 tablet 1  . fluvoxaMINE (LUVOX) 100 MG tablet Take 1 tablet (100 mg total) by mouth at bedtime. 30 tablet 1  . hydrOXYzine (ATARAX/VISTARIL) 25 MG tablet Take 1 tablet (25 mg total) by mouth 3 (three) times daily as needed for anxiety. 90 tablet 1  . QUEtiapine (SEROQUEL) 25 MG tablet Take 1 tablet (25 mg total) by mouth 3 (three) times daily. 90 tablet 1  . QUEtiapine (SEROQUEL) 300 MG tablet Take 1 tablet (300 mg total) by mouth at bedtime. 30 tablet 1  . ticagrelor (BRILINTA) 90 MG TABS tablet Take 1 tablet (90 mg total) by mouth 2 (two) times daily.  (Patient not taking: Reported on 06/03/2016) 60 tablet 1    Musculoskeletal: Strength & Muscle Tone: within normal limits Gait & Station: normal Patient leans: N/A  Psychiatric Specialty Exam: Physical Exam  Nursing note and vitals reviewed. Constitutional: He appears well-developed and well-nourished.  HENT:  Head: Normocephalic and atraumatic.  Eyes: Conjunctivae are normal. Pupils are equal, round, and reactive to light.  Neck: Normal range of motion.  Cardiovascular: Regular rhythm and normal heart sounds.   Respiratory: Effort normal. No respiratory distress.  GI: Soft.  Musculoskeletal: Normal range of motion.  Neurological: He is alert.  Skin: Skin is warm and dry.  Psychiatric: His mood appears anxious. His speech is delayed. He is slowed. Cognition and memory are normal. He expresses impulsivity. He expresses  no homicidal and no suicidal ideation.    Review of Systems  Constitutional: Negative.   HENT: Negative.   Eyes: Negative.   Respiratory: Negative.   Cardiovascular: Negative.   Gastrointestinal: Negative.   Musculoskeletal: Negative.   Skin: Negative.   Neurological: Negative.   Psychiatric/Behavioral: Positive for depression. Negative for hallucinations, memory loss, substance abuse and suicidal ideas. The patient is nervous/anxious and has insomnia.     Blood pressure 132/90, pulse 91, temperature 97.9 F (36.6 C), temperature source Oral, resp. rate 16, height 6\' 2"  (1.88 m), weight 81.6 kg (180 lb), SpO2 97 %.Body mass index is 23.11 kg/m.  General Appearance: Fairly Groomed  Eye Contact:  Fair  Speech:  Slow  Volume:  Normal  Mood:  Anxious  Affect:  Congruent  Thought Process:  Goal Directed  Orientation:  Full (Time, Place, and Person)  Thought Content:  Logical  Suicidal Thoughts:  No  Homicidal Thoughts:  No  Memory:  Immediate;   Good Recent;   Fair Remote;   Poor  Judgement:  Impaired  Insight:  Shallow  Psychomotor Activity:  Normal   Concentration:  Concentration: Fair  Recall:  [employer] of Knowledge:  Fair  Language:  Fair  Akathisia:  No  Handed:  Right  AIMS (if indicated):     Assets:  Desire for Improvement Financial Resources/Insurance Housing Social Support  ADL's:  Intact  Cognition:  WNL  Sleep:        Treatment Plan Summary: Plan 46 year old man with a history of anxiety and depression. Not actively suicidal. Not psychotic. Patient's behavior has been appropriate since being in the emergency room. Patient is not meeting commitment criteria nor does require inpatient psychiatric treatment. He Artie has medication and has an outpatient plan to follow-up with Dr. Kasandra Knudsen. Case reviewed with emergency room physician. Patient no longer meets commitment criteria and can be discharged home.  Disposition: Patient does not meet criteria for psychiatric inpatient admission. Supportive therapy provided about ongoing stressors.  Alethia Berthold, MD 06/05/2016 3:33 PM

## 2016-06-05 NOTE — ED Notes (Signed)

## 2016-06-05 NOTE — Discharge Instructions (Signed)
Followup with your psychiatrist this week.  Return for any concerns.

## 2016-06-10 ENCOUNTER — Emergency Department
Admission: EM | Admit: 2016-06-10 | Discharge: 2016-06-10 | Disposition: A | Discharge status: Home / Self Care | Payer: Medicare Other | Attending: Emergency Medicine | Admitting: Emergency Medicine

## 2016-06-10 DIAGNOSIS — F17290 Nicotine dependence, other tobacco product, uncomplicated: Secondary | ICD-10-CM | POA: Insufficient documentation

## 2016-06-10 DIAGNOSIS — I2510 Atherosclerotic heart disease of native coronary artery without angina pectoris: Secondary | ICD-10-CM | POA: Insufficient documentation

## 2016-06-10 DIAGNOSIS — Z79899 Other long term (current) drug therapy: Secondary | ICD-10-CM | POA: Insufficient documentation

## 2016-06-10 DIAGNOSIS — N189 Chronic kidney disease, unspecified: Secondary | ICD-10-CM | POA: Insufficient documentation

## 2016-06-10 DIAGNOSIS — I129 Hypertensive chronic kidney disease with stage 1 through stage 4 chronic kidney disease, or unspecified chronic kidney disease: Secondary | ICD-10-CM | POA: Insufficient documentation

## 2016-06-10 DIAGNOSIS — F17210 Nicotine dependence, cigarettes, uncomplicated: Secondary | ICD-10-CM | POA: Diagnosis not present

## 2016-06-10 DIAGNOSIS — F419 Anxiety disorder, unspecified: Secondary | ICD-10-CM | POA: Diagnosis not present

## 2016-06-10 DIAGNOSIS — I252 Old myocardial infarction: Secondary | ICD-10-CM | POA: Insufficient documentation

## 2016-06-10 DIAGNOSIS — Z046 Encounter for general psychiatric examination, requested by authority: Secondary | ICD-10-CM | POA: Diagnosis present

## 2016-06-10 DIAGNOSIS — E1122 Type 2 diabetes mellitus with diabetic chronic kidney disease: Secondary | ICD-10-CM | POA: Insufficient documentation

## 2016-06-10 DIAGNOSIS — Z7982 Long term (current) use of aspirin: Secondary | ICD-10-CM | POA: Diagnosis not present

## 2016-06-10 LAB — COMPREHENSIVE METABOLIC PANEL
ALBUMIN: 3.9 g/dL (ref 3.5–5.0)
ALT: 36 U/L (ref 17–63)
ANION GAP: 10 (ref 5–15)
AST: 29 U/L (ref 15–41)
Alkaline Phosphatase: 118 U/L (ref 38–126)
BILIRUBIN TOTAL: 0.4 mg/dL (ref 0.3–1.2)
BUN: 11 mg/dL (ref 6–20)
CALCIUM: 9 mg/dL (ref 8.9–10.3)
CO2: 21 mmol/L — ABNORMAL LOW (ref 22–32)
CREATININE: 1.11 mg/dL (ref 0.61–1.24)
Chloride: 106 mmol/L (ref 101–111)
GFR calc Af Amer: >60 mL/min (ref >60)
GFR calc non Af Amer: >60 mL/min (ref >60)
Glucose, Bld: 119 mg/dL — ABNORMAL HIGH (ref 65–99)
Potassium: 3.9 mmol/L (ref 3.5–5.1)
SODIUM: 137 mmol/L (ref 135–145)
Total Protein: 7.4 g/dL (ref 6.5–8.1)

## 2016-06-10 LAB — URINE DRUG SCREEN, QUALITATIVE (ARMC ONLY)
Amphetamines, Ur Screen: NOT DETECTED
BARBITURATES, UR SCREEN: NOT DETECTED
Benzodiazepine, Ur Scrn: NOT DETECTED
Cannabinoid 50 Ng, Ur ~~LOC~~: POSITIVE — AB
Cocaine Metabolite,Ur ~~LOC~~: NOT DETECTED
MDMA (Ecstasy)Ur Screen: NOT DETECTED
Methadone Scn, Ur: NOT DETECTED
Opiate, Ur Screen: NOT DETECTED
PHENCYCLIDINE (PCP) UR S: NOT DETECTED
TRICYCLIC, UR SCREEN: NOT DETECTED

## 2016-06-10 LAB — SALICYLATE LEVEL: SALICYLATE LVL: 21 mg/dL (ref 2.8–30.0)

## 2016-06-10 LAB — CBC
HCT: 44.7 % (ref 40.0–52.0)
Hemoglobin: 15.2 g/dL (ref 13.0–18.0)
MCH: 29.8 pg (ref 26.0–34.0)
MCHC: 34 g/dL (ref 32.0–36.0)
MCV: 87.8 fL (ref 80.0–100.0)
PLATELETS: 378 10*3/uL (ref 150–440)
RBC: 5.09 MIL/uL (ref 4.40–5.90)
RDW: 14.9 % — ABNORMAL HIGH (ref 11.5–14.5)
WBC: 14.3 10*3/uL — ABNORMAL HIGH (ref 3.8–10.6)

## 2016-06-10 LAB — ACETAMINOPHEN LEVEL: Acetaminophen (Tylenol), Serum: <10 ug/mL — ABNORMAL LOW (ref 10–30)

## 2016-06-10 LAB — ETHANOL: Alcohol, Ethyl (B): <5 mg/dL (ref <5)

## 2016-06-10 MED ORDER — LORAZEPAM 2 MG/ML IJ SOLN
1.0000 mg | Freq: Once | INTRAMUSCULAR | Status: AC
  Administered 2016-06-10: 1 mg via INTRAVENOUS
  Filled 2016-06-10: qty 1

## 2016-06-10 NOTE — ED Triage Notes (Signed)
Pt came to ED via EMS from home. Reports suicidal thoughts, unable to verbalize plan. Reports withdrawing from alcohol and clonipine. Reports last use 2/16. Pt restless in bed.

## 2016-06-10 NOTE — ED Provider Notes (Signed)
[facility] Emergency Department Provider Note  ____________________________________________   I have reviewed the triage vital signs and the nursing notes.   HISTORY  Chief Complaint Suicidal    HPI Michael Schmitt is a 46 y.o. male are well-known to [place] presents today complaining of being anxious. To me he denies any SI or HIhe states he is just anxious. He thought maybe he was anxious because he was in withdrawal from benzos but he hasn't used them in well over a month. Same with alcohol. I did express some this is unlikely. Patient has no hallucinations, no thoughts of hurting himself or anyone else. He just feels anxious. Patient is very frequently here with similar complaints.      Past Medical History:  Diagnosis Date  . Anxiety   . Anxiety   . Arthritis    cerv. stenosis, spondylosis, HNP- lower back , has been followed in pain clinic, has  had injection s in cerv. area  . Blood dyscrasia    told that when he was young he was a" free bleeder"  . CAD (coronary artery disease)   . Cervical spondylosis without myelopathy 07/24/2014  . Cervicogenic headache 07/24/2014  . Chronic kidney disease    renal calculi- passed spontaneously  . Depression   . Diabetes mellitus without complication ([place])   . Fatty liver   . GERD (gastroesophageal reflux disease)   . Headache(784.0)   . Hyperlipidemia   . Hypertension   . Mental disorder   . MI, old   . RLS (restless legs syndrome)    detected on sleep study  . Shortness of breath   . Ventricular fibrillation ([place]) 04/05/2016    Patient Active Problem List   Diagnosis Date Noted  . Phencyclidine (PCP) use disorder, severe, dependence ([place]) 05/28/2016  . Severe recurrent major depression without psychotic features ([place]) 05/26/2016  . Alcohol abuse 05/12/2016  . Substance induced mood disorder ([place]) 05/12/2016  . Sedative, hypnotic or anxiolytic use disorder, severe, dependence ([place]) 05/12/2016  .  Ventricular fibrillation ([place]) 04/05/2016  . Coronary stent thrombosis   . Generalized anxiety disorder 08/11/2015  . Major depressive disorder, recurrent episode, moderate with anxious distress ([place]) 06/21/2015  . NSTEMI (non-ST elevated myocardial infarction) ([place]) 06/20/2015  . Acute ST elevation myocardial infarction (STEMI) involving left anterior descending (LAD) coronary artery ([place]) 01/01/2015  . Acute MI, inferoposterior wall ([place]) 01/01/2015  . Common bile duct dilation 10/06/2014  . Right lower quadrant abdominal pain 10/06/2014  . Loss of weight 10/06/2014  . Cervicogenic headache 07/24/2014  . Cervical spondylosis without myelopathy 07/24/2014  . Clinical depression 04/15/2013  . Acid reflux 04/15/2013  . BP (high blood pressure) 04/15/2013  . Encounter for other preprocedural examination 04/15/2013  . Nasal obstruction 02/12/2013  . Inflamed nasal mucosa 02/12/2013  . Sinus infection 02/12/2013  . Gastric catarrh 03/01/2011  . Allergic rhinitis 10/20/2010  . Airway hyperreactivity 10/20/2010  . Anxiety, generalized 10/20/2010  . Cervical pain 10/20/2010  . Tobacco use disorder 10/20/2010    Past Surgical History:  Procedure Laterality Date  . ANTERIOR CERVICAL DECOMP/DISCECTOMY FUSION  11/13/2011   Procedure: ANTERIOR CERVICAL DECOMPRESSION/DISCECTOMY FUSION 1 LEVEL;  Surgeon: [name], MD;  Location: [place] [facility];  Service: Neurosurgery;  Laterality: N/A;  Anterior Cervical Decompression/discectomy Fusion. Cervical three-four.  Marland Kitchen CARDIAC CATHETERIZATION N/A 01/01/2015   Procedure: Left Heart Cath and Coronary Angiography;  Surgeon: [name], MD;  Location: [place] [facility];  Service: Cardiovascular;  Laterality: N/A;  .  CARDIAC CATHETERIZATION N/A 04/05/2016   Procedure: Left Heart Cath and Coronary Angiography;  Surgeon: Belva Crome, MD;  Location: [place] [facility];  Service: Cardiovascular;  Laterality: N/A;  . CARDIAC CATHETERIZATION N/A 04/05/2016    Procedure: Coronary Stent Intervention;  Surgeon: Belva Crome, MD;  Location: [place] [facility];  Service: Cardiovascular;  Laterality: N/A;  . CARDIAC CATHETERIZATION N/A 04/05/2016   Procedure: Intravascular Ultrasound/IVUS;  Surgeon: Belva Crome, MD;  Location: [place] [facility];  Service: Cardiovascular;  Laterality: N/A;  . NASAL SINUS SURGERY     2005    Prior to Admission medications   Medication Sig Start Date End Date Taking? Authorizing Provider  aspirin 81 MG EC tablet Take 1 tablet (81 mg total) by mouth daily. 06/02/16   Clovis Fredrickson, MD  fluvoxaMINE (LUVOX) 100 MG tablet Take 1 tablet (100 mg total) by mouth at bedtime. 06/02/16   Clovis Fredrickson, MD  hydrOXYzine (ATARAX/VISTARIL) 25 MG tablet Take 1 tablet (25 mg total) by mouth 3 (three) times daily as needed for anxiety. 06/02/16   Clovis Fredrickson, MD  QUEtiapine (SEROQUEL) 25 MG tablet Take 1 tablet (25 mg total) by mouth 3 (three) times daily. 06/02/16   Clovis Fredrickson, MD  QUEtiapine (SEROQUEL) 300 MG tablet Take 1 tablet (300 mg total) by mouth at bedtime. 06/02/16   Clovis Fredrickson, MD  ticagrelor (BRILINTA) 90 MG TABS tablet Take 1 tablet (90 mg total) by mouth 2 (two) times daily. Patient not taking: Reported on 06/03/2016 06/02/16   Clovis Fredrickson, MD    Allergies Prednisone; Hydrocodone; and Varenicline  Family History  Problem Relation Age of Onset  . Prostate cancer Father   . Hypertension Mother   . Kidney Stones Mother   . Anxiety disorder Mother   . Depression Mother   . COPD Sister   . Hypertension Sister   . Diabetes Sister   . Depression Sister   . Anxiety disorder Sister   . Seizures Sister     Social History Social History  Substance Use Topics  . Smoking status: Current Every Day Smoker    Packs/day: 2.00    Years: 17.00    Types: Cigarettes, Cigars  . Smokeless tobacco: Former Systems developer     Comment: occassional snuff   . Alcohol use 0.0 oz/week     Comment:  trying not to drink     Review of Systems Constitutional: No fever/chills Eyes: No visual changes. ENT: No sore throat. No stiff neck no neck pain Cardiovascular: Denies chest pain. Respiratory: Denies shortness of breath. Gastrointestinal:   no vomiting.  No diarrhea.  No constipation. Genitourinary: Negative for dysuria. Musculoskeletal: Negative lower extremity swelling Skin: Negative for rash. Neurological: Negative for severe headaches, focal weakness or numbness. 10-point ROS otherwise negative.  ____________________________________________   PHYSICAL EXAM:  VITAL SIGNS: ED Triage Vitals  Enc Vitals Group     BP 06/10/16 1124 (!) 156/95     Pulse Rate 06/10/16 1124 95     Resp 06/10/16 1124 20     Temp 06/10/16 1124 97.9 F (36.6 C)     Temp Source 06/10/16 1124 Oral     SpO2 06/10/16 1124 100 %     Weight 06/10/16 1119 180 lb (81.6 kg)     Height 06/10/16 1119 6\' 2"  (1.88 m)     Head Circumference --      Peak Flow --      Pain Score --  Pain Loc --      Pain Edu? --      Excl. in [place]? --     Constitutional: Alert and oriented. Well appearing and in no acute distress.anxious and upset but in no other medical  Eyes: Conjunctivae are normal. PERRL. EOMI. Head: Atraumatic. Nose: No congestion/rhinnorhea. Mouth/Throat: Mucous membranes are moist.  Oropharynx non-erythematous. Neck: No stridor.   Nontender with no meningismus Cardiovascular: Normal rate, regular rhythm. Grossly normal heart sounds.  Good peripheral circulation. Respiratory: Normal respiratory effort.  No retractions. Lungs CTAB. Abdominal: Soft and nontender. No distention. No guarding no rebound Back:  There is no focal tenderness or step off.  there is no midline tenderness there are no lesions noted. there is no CVA tenderness Musculoskeletal: No lower extremity tenderness, no upper extremity tenderness. No joint effusions, no DVT signs strong distal pulses no edema Neurologic:  Normal  speech and language. No gross focal neurologic deficits are appreciated.  Skin:  Skin is warm, dry and intact. No rash noted. Psychiatric: Mood and affect are normal. Speech and behavior are normal.  ____________________________________________   LABS (all labs ordered are listed, but only abnormal results are displayed)  Labs Reviewed  COMPREHENSIVE METABOLIC PANEL - Abnormal; Notable for the following:       Result Value   CO2 21 (*)    Glucose, Bld 119 (*)    All other components within normal limits  ACETAMINOPHEN LEVEL - Abnormal; Notable for the following:    Acetaminophen (Tylenol), Serum <10 (*)    All other components within normal limits  CBC - Abnormal; Notable for the following:    WBC 14.3 (*)    RDW 14.9 (*)    All other components within normal limits  ETHANOL  SALICYLATE LEVEL  URINE DRUG SCREEN, QUALITATIVE ([facility] ONLY)   ____________________________________________  EKG  I personally interpreted any EKGs ordered by me or triage  ____________________________________________  RADIOLOGY  I reviewed any imaging ordered by me or triage that were performed during my shift and, if possible, patient and/or family made aware of any abnormal findings. ____________________________________________   PROCEDURES  Procedure(s) performed: None  Procedures  Critical Care performed: None  ____________________________________________   INITIAL IMPRESSION / ASSESSMENT AND PLAN / ED COURSE  Pertinent labs & imaging results that were available during my care of the patient were reviewed by me and considered in my medical decision making (see chart for details).  The patientpresents with anxiety gave him some benzodiazepine and he feels much better. No evidence of withdrawal. He was evaluated by psychiatry they feel there is no role for inpatient treatment. The wish him to go home. Patient has adequate outpatient follow-up and we will discharge him     ____________________________________________   FINAL CLINICAL IMPRESSION(S) / ED DIAGNOSES  Final diagnoses:  None      This chart was dictated using voice recognition software.  Despite best efforts to proofread,  errors can occur which can change meaning.      Schuyler Amor, MD 06/10/16 [phone]

## 2016-06-10 NOTE — ED Notes (Signed)
Pt called ride. Sister will come get pt.

## 2016-06-11 DIAGNOSIS — Z79899 Other long term (current) drug therapy: Secondary | ICD-10-CM | POA: Diagnosis not present

## 2016-06-11 DIAGNOSIS — I2510 Atherosclerotic heart disease of native coronary artery without angina pectoris: Secondary | ICD-10-CM | POA: Diagnosis not present

## 2016-06-11 DIAGNOSIS — F1310 Sedative, hypnotic or anxiolytic abuse, uncomplicated: Secondary | ICD-10-CM | POA: Insufficient documentation

## 2016-06-11 DIAGNOSIS — Z8249 Family history of ischemic heart disease and other diseases of the circulatory system: Secondary | ICD-10-CM | POA: Diagnosis not present

## 2016-06-11 DIAGNOSIS — F1021 Alcohol dependence, in remission: Secondary | ICD-10-CM | POA: Insufficient documentation

## 2016-06-11 DIAGNOSIS — Z7951 Long term (current) use of inhaled steroids: Secondary | ICD-10-CM | POA: Diagnosis not present

## 2016-06-11 DIAGNOSIS — G4700 Insomnia, unspecified: Secondary | ICD-10-CM | POA: Diagnosis not present

## 2016-06-11 DIAGNOSIS — Z7982 Long term (current) use of aspirin: Secondary | ICD-10-CM | POA: Diagnosis not present

## 2016-06-11 DIAGNOSIS — I10 Essential (primary) hypertension: Secondary | ICD-10-CM | POA: Diagnosis not present

## 2016-06-11 DIAGNOSIS — G8929 Other chronic pain: Secondary | ICD-10-CM | POA: Diagnosis not present

## 2016-06-11 DIAGNOSIS — I252 Old myocardial infarction: Secondary | ICD-10-CM | POA: Diagnosis not present

## 2016-06-11 DIAGNOSIS — Z765 Malingerer [conscious simulation]: Secondary | ICD-10-CM | POA: Insufficient documentation

## 2016-06-11 DIAGNOSIS — K219 Gastro-esophageal reflux disease without esophagitis: Secondary | ICD-10-CM | POA: Diagnosis not present

## 2016-06-11 DIAGNOSIS — R5383 Other fatigue: Secondary | ICD-10-CM | POA: Diagnosis not present

## 2016-06-23 ENCOUNTER — Encounter: Payer: Self-pay | Admitting: Emergency Medicine

## 2016-06-23 ENCOUNTER — Emergency Department: Payer: Medicare Other

## 2016-06-23 ENCOUNTER — Emergency Department
Admission: EM | Admit: 2016-06-23 | Discharge: 2016-06-23 | Disposition: A | Discharge status: Home / Self Care | Payer: Medicare Other | Attending: Emergency Medicine | Admitting: Emergency Medicine

## 2016-06-23 DIAGNOSIS — F17210 Nicotine dependence, cigarettes, uncomplicated: Secondary | ICD-10-CM | POA: Insufficient documentation

## 2016-06-23 DIAGNOSIS — N189 Chronic kidney disease, unspecified: Secondary | ICD-10-CM | POA: Insufficient documentation

## 2016-06-23 DIAGNOSIS — F1910 Other psychoactive substance abuse, uncomplicated: Secondary | ICD-10-CM | POA: Diagnosis not present

## 2016-06-23 DIAGNOSIS — R110 Nausea: Secondary | ICD-10-CM | POA: Diagnosis not present

## 2016-06-23 DIAGNOSIS — R079 Chest pain, unspecified: Secondary | ICD-10-CM | POA: Diagnosis not present

## 2016-06-23 DIAGNOSIS — Z7982 Long term (current) use of aspirin: Secondary | ICD-10-CM | POA: Insufficient documentation

## 2016-06-23 DIAGNOSIS — Z79899 Other long term (current) drug therapy: Secondary | ICD-10-CM | POA: Insufficient documentation

## 2016-06-23 DIAGNOSIS — F419 Anxiety disorder, unspecified: Secondary | ICD-10-CM | POA: Diagnosis not present

## 2016-06-23 DIAGNOSIS — E1122 Type 2 diabetes mellitus with diabetic chronic kidney disease: Secondary | ICD-10-CM | POA: Insufficient documentation

## 2016-06-23 DIAGNOSIS — I129 Hypertensive chronic kidney disease with stage 1 through stage 4 chronic kidney disease, or unspecified chronic kidney disease: Secondary | ICD-10-CM | POA: Diagnosis not present

## 2016-06-23 DIAGNOSIS — I2510 Atherosclerotic heart disease of native coronary artery without angina pectoris: Secondary | ICD-10-CM | POA: Diagnosis not present

## 2016-06-23 DIAGNOSIS — F17290 Nicotine dependence, other tobacco product, uncomplicated: Secondary | ICD-10-CM | POA: Diagnosis not present

## 2016-06-23 LAB — URINE DRUG SCREEN, QUALITATIVE (ARMC ONLY)
Amphetamines, Ur Screen: NOT DETECTED
BARBITURATES, UR SCREEN: NOT DETECTED
Benzodiazepine, Ur Scrn: NOT DETECTED
Cannabinoid 50 Ng, Ur ~~LOC~~: NOT DETECTED
Cocaine Metabolite,Ur ~~LOC~~: NOT DETECTED
MDMA (Ecstasy)Ur Screen: NOT DETECTED
METHADONE SCREEN, URINE: NOT DETECTED
Opiate, Ur Screen: NOT DETECTED
Phencyclidine (PCP) Ur S: NOT DETECTED
TRICYCLIC, UR SCREEN: POSITIVE — AB

## 2016-06-23 LAB — COMPREHENSIVE METABOLIC PANEL
ALBUMIN: 4.5 g/dL (ref 3.5–5.0)
ALT: 44 U/L (ref 17–63)
ANION GAP: 10 (ref 5–15)
AST: 27 U/L (ref 15–41)
Alkaline Phosphatase: 136 U/L — ABNORMAL HIGH (ref 38–126)
BILIRUBIN TOTAL: 0.7 mg/dL (ref 0.3–1.2)
BUN: 8 mg/dL (ref 6–20)
CO2: 25 mmol/L (ref 22–32)
Calcium: 9.9 mg/dL (ref 8.9–10.3)
Chloride: 102 mmol/L (ref 101–111)
Creatinine, Ser: 0.92 mg/dL (ref 0.61–1.24)
GFR calc Af Amer: >60 mL/min (ref >60)
Glucose, Bld: 150 mg/dL — ABNORMAL HIGH (ref 65–99)
POTASSIUM: 4 mmol/L (ref 3.5–5.1)
Sodium: 137 mmol/L (ref 135–145)
TOTAL PROTEIN: 7.7 g/dL (ref 6.5–8.1)

## 2016-06-23 LAB — CBC WITH DIFFERENTIAL/PLATELET
BASOS PCT: 0 %
Basophils Absolute: 0 10*3/uL (ref 0–0.1)
EOS ABS: 0.1 10*3/uL (ref 0–0.7)
Eosinophils Relative: 1 %
HCT: 45.8 % (ref 40.0–52.0)
HEMOGLOBIN: 15.8 g/dL (ref 13.0–18.0)
Lymphocytes Relative: 16 %
Lymphs Abs: 1.3 10*3/uL (ref 1.0–3.6)
MCH: 30.1 pg (ref 26.0–34.0)
MCHC: 34.6 g/dL (ref 32.0–36.0)
MCV: 86.9 fL (ref 80.0–100.0)
Monocytes Absolute: 0.5 10*3/uL (ref 0.2–1.0)
Monocytes Relative: 6 %
NEUTROS PCT: 77 %
Neutro Abs: 6.3 10*3/uL (ref 1.4–6.5)
Platelets: 214 10*3/uL (ref 150–440)
RBC: 5.27 MIL/uL (ref 4.40–5.90)
RDW: 14.3 % (ref 11.5–14.5)
WBC: 8.2 10*3/uL (ref 3.8–10.6)

## 2016-06-23 LAB — ETHANOL: Alcohol, Ethyl (B): <5 mg/dL (ref <5)

## 2016-06-23 LAB — ACETAMINOPHEN LEVEL: Acetaminophen (Tylenol), Serum: <10 ug/mL — ABNORMAL LOW (ref 10–30)

## 2016-06-23 LAB — TROPONIN I: Troponin I: <0.03 ng/mL (ref <0.03)

## 2016-06-23 MED ORDER — ONDANSETRON 4 MG PO TBDP
4.0000 mg | ORAL_TABLET | Freq: Once | ORAL | Status: AC
  Administered 2016-06-23: 4 mg via ORAL
  Filled 2016-06-23: qty 1

## 2016-06-23 MED ORDER — LORAZEPAM 2 MG PO TABS
2.0000 mg | ORAL_TABLET | Freq: Once | ORAL | Status: AC
  Administered 2016-06-23: 2 mg via ORAL
  Filled 2016-06-23: qty 1

## 2016-06-23 NOTE — Progress Notes (Signed)
READ HIS PLAN OF CARE FYI- Section  UNDER FYI- Once medically cleared he can follow up with his community supports.   [employer] LCSW [phone]

## 2016-06-23 NOTE — ED Notes (Signed)
Called [facility] for consult [phone]

## 2016-06-23 NOTE — Progress Notes (Signed)
LCSW met with patient and he was pleasant and less anxious than before. He reported he has been at [facility] for almost a week and discharged on this past  Sunday  He reported he went to [place] and did well on their program and reports he no longer drinks/ Court is on [address] and reports he hopes he will get off and not have jail time.  He stated his sister and mother have been a great support and he even went to [place] last Sunday.  He has made an appointment with [facility] April 3rd at 2pm and will be attending th [place] called and spoke to his sister yesterday and his family expects a home visit soon.  March 27th appointment was cancelled with Dr Evelena Peat as he was in [facility]. Patient himself will renew a new time to follow up with Dr Evelena Peat.   [employer] LCSW [phone]

## 2016-06-23 NOTE — ED Notes (Signed)
Shaunta Milsapp, sister to pt called, on the way to pick-up pt

## 2016-06-23 NOTE — ED Notes (Signed)
Per verbal from Dr. Cinda Quest, wait until patient is medically cleared to dress patient in behavioral paper scrubs.

## 2016-06-23 NOTE — ED Provider Notes (Signed)
[facility] Emergency Department Provider Note   ____________________________________________   First MD Initiated Contact with Patient 06/23/16 1320     (approximate)  I have reviewed the triage vital signs and the nursing notes.   HISTORY  Chief Complaint Chest Pain and Suicidal    HPI Michael Schmitt is a 46 y.o. male who comes in by EMS. EMS reports urine initially called out for chest pain but then the patient said he was really having anxiety. Patient tells me that he had some chest pain this morning but will not elaborate on that further. He says he is really very anxious his been in the hospital for anxiety several times is still anxious and nauseated and almost crying with his anxiety. He does not want to talk about his chest pain anymore.   Past Medical History:  Diagnosis Date  . Anxiety   . Anxiety   . Arthritis    cerv. stenosis, spondylosis, HNP- lower back , has been followed in pain clinic, has  had injection s in cerv. area  . Blood dyscrasia    told that when he was young he was a" free bleeder"  . CAD (coronary artery disease)   . Cervical spondylosis without myelopathy 07/24/2014  . Cervicogenic headache 07/24/2014  . Chronic kidney disease    renal calculi- passed spontaneously  . Depression   . Diabetes mellitus without complication ([place])   . Fatty liver   . GERD (gastroesophageal reflux disease)   . Headache(784.0)   . Hyperlipidemia   . Hypertension   . Mental disorder   . MI, old   . RLS (restless legs syndrome)    detected on sleep study  . Shortness of breath   . Ventricular fibrillation ([place]) 04/05/2016    Patient Active Problem List   Diagnosis Date Noted  . Phencyclidine (PCP) use disorder, severe, dependence ([place]) 05/28/2016  . Severe recurrent major depression without psychotic features ([place]) 05/26/2016  . Alcohol abuse 05/12/2016  . Substance induced mood disorder ([place]) 05/12/2016  . Sedative, hypnotic  or anxiolytic use disorder, severe, dependence ([place]) 05/12/2016  . Ventricular fibrillation ([place]) 04/05/2016  . Coronary stent thrombosis   . Generalized anxiety disorder 08/11/2015  . Major depressive disorder, recurrent episode, moderate with anxious distress ([place]) 06/21/2015  . NSTEMI (non-ST elevated myocardial infarction) ([place] ) 06/20/2015  . Acute ST elevation myocardial infarction (STEMI) involving left anterior descending (LAD) coronary artery ([place]) 01/01/2015  . Acute MI, inferoposterior wall ([place]) 01/01/2015  . Common bile duct dilation 10/06/2014  . Right lower quadrant abdominal pain 10/06/2014  . Loss of weight 10/06/2014  . Cervicogenic headache 07/24/2014  . Cervical spondylosis without myelopathy 07/24/2014  . Clinical depression 04/15/2013  . Acid reflux 04/15/2013  . BP (high blood pressure) 04/15/2013  . Encounter for other preprocedural examination 04/15/2013  . Nasal obstruction 02/12/2013  . Inflamed nasal mucosa 02/12/2013  . Sinus infection 02/12/2013  . Gastric catarrh 03/01/2011  . Allergic rhinitis 10/20/2010  . Airway hyperreactivity 10/20/2010  . Anxiety, generalized 10/20/2010  . Cervical pain 10/20/2010  . Tobacco use disorder 10/20/2010    Past Surgical History:  Procedure Laterality Date  . ANTERIOR CERVICAL DECOMP/DISCECTOMY FUSION  11/13/2011   Procedure: ANTERIOR CERVICAL DECOMPRESSION/DISCECTOMY FUSION 1 LEVEL;  Surgeon: [name], MD;  Location: [place] [facility];  Service: Neurosurgery;  Laterality: N/A;  Anterior Cervical Decompression/discectomy Fusion. Cervical three-four.  Marland Kitchen CARDIAC CATHETERIZATION N/A 01/01/2015   Procedure: Left Heart Cath and Coronary Angiography;  Surgeon: [name], MD;  Location: [place] [facility];  Service: Cardiovascular;  Laterality: N/A;  . CARDIAC CATHETERIZATION N/A 04/05/2016   Procedure: Left Heart Cath and Coronary Angiography;  Surgeon: Belva Crome, MD;  Location: [place] [facility];  Service:  Cardiovascular;  Laterality: N/A;  . CARDIAC CATHETERIZATION N/A 04/05/2016   Procedure: Coronary Stent Intervention;  Surgeon: Belva Crome, MD;  Location: [place] [facility];  Service: Cardiovascular;  Laterality: N/A;  . CARDIAC CATHETERIZATION N/A 04/05/2016   Procedure: Intravascular Ultrasound/IVUS;  Surgeon: Belva Crome, MD;  Location: [place] [facility];  Service: Cardiovascular;  Laterality: N/A;  . NASAL SINUS SURGERY     2005    Prior to Admission medications   Medication Sig Start Date End Date Taking? Authorizing Provider  aspirin 81 MG EC tablet Take 1 tablet (81 mg total) by mouth daily. 06/02/16   Clovis Fredrickson, MD  fluvoxaMINE (LUVOX) 100 MG tablet Take 1 tablet (100 mg total) by mouth at bedtime. 06/02/16   Clovis Fredrickson, MD  hydrOXYzine (ATARAX/VISTARIL) 25 MG tablet Take 1 tablet (25 mg total) by mouth 3 (three) times daily as needed for anxiety. 06/02/16   Clovis Fredrickson, MD  QUEtiapine (SEROQUEL) 25 MG tablet Take 1 tablet (25 mg total) by mouth 3 (three) times daily. 06/02/16   Clovis Fredrickson, MD  QUEtiapine (SEROQUEL) 300 MG tablet Take 1 tablet (300 mg total) by mouth at bedtime. 06/02/16   Clovis Fredrickson, MD  ticagrelor (BRILINTA) 90 MG TABS tablet Take 1 tablet (90 mg total) by mouth 2 (two) times daily. Patient not taking: Reported on 06/03/2016 06/02/16   Clovis Fredrickson, MD    Allergies Prednisone; Hydrocodone; and Varenicline  Family History  Problem Relation Age of Onset  . Prostate cancer Father   . Hypertension Mother   . Kidney Stones Mother   . Anxiety disorder Mother   . Depression Mother   . COPD Sister   . Hypertension Sister   . Diabetes Sister   . Depression Sister   . Anxiety disorder Sister   . Seizures Sister     Social History Social History  Substance Use Topics  . Smoking status: Current Every Day Smoker    Packs/day: 2.00    Years: 17.00    Types: Cigarettes, Cigars  . Smokeless tobacco: Former Systems developer       Comment: occassional snuff   . Alcohol use 0.0 oz/week     Comment: trying not to drink     Review of Systems Constitutional: No fever/chills Eyes: No visual changes. ENT: No sore throat. Cardiovascular: See history of present illness Respiratory: Denies shortness of breath. Gastrointestinal: No abdominal pain.  No nausea, no vomiting.  No diarrhea.  No constipation. Genitourinary: Negative for dysuria. Musculoskeletal: Negative for back pain. Skin: Negative for rash. Neurological: Negative for headaches, focal weakness or numbness.  10-point ROS otherwise negative.  ____________________________________________   PHYSICAL EXAM:  VITAL SIGNS: ED Triage Vitals  Enc Vitals Group     BP 06/23/16 1330 (!) 160/99     Pulse Rate 06/23/16 1330 87     Resp 06/23/16 1330 17     Temp 06/23/16 1330 98.4 F (36.9 C)     Temp Source 06/23/16 1330 Oral     SpO2 06/23/16 1330 99 %     Weight 06/23/16 1324 180 lb (81.6 kg)     Height 06/23/16 1324 5\' 10"  (1.778 m)  Head Circumference --      Peak Flow --      Pain Score 06/23/16 1324 10     Pain Loc --      Pain Edu? --      Excl. in [place]? --     Constitutional: Alert and oriented.Appearing anxious Eyes: Conjunctivae are normal. PERRL. EOMI. Head: Atraumatic. Nose: No congestion/rhinnorhea. Mouth/Throat: Mucous membranes are moist.  Oropharynx non-erythematous. Neck: No stridor. Cardiovascular: Normal rate, regular rhythm. Grossly normal heart sounds.  Good peripheral circulation. Respiratory: Normal respiratory effort.  No retractions. Lungs CTAB. Gastrointestinal: Soft and nontender. No distention. No abdominal bruits. No CVA tenderness. Musculoskeletal: No lower extremity tenderness nor edema.  No joint effusions. Neurologic:  Normal speech and language. No gross focal neurologic deficits are appreciated. No gait instability. Skin:  Skin is warm, dry and intact. No rash  noted.  ____________________________________________   LABS (all labs ordered are listed, but only abnormal results are displayed)  Labs Reviewed  ACETAMINOPHEN LEVEL  COMPREHENSIVE METABOLIC PANEL  ETHANOL  TROPONIN I  CBC WITH DIFFERENTIAL/PLATELET  URINE DRUG SCREEN, QUALITATIVE ([place])   ____________________________________________  EKG  EKG read and interpreted by me shows sinus rhythm at a rate of 86 right axis there is some ST segment elevation in V3 and V4 slightly in the 5 as well this was not present on the last EKG done in the emergency room but was present on several EKGs previous to that. ____________________________________________  RADIOLOGY  Study Result   CLINICAL DATA:  Chest pain  EXAM: CHEST  2 VIEW  COMPARISON:  05/14/2016  FINDINGS: The heart size and mediastinal contours are within normal limits. Both lungs are clear. The visualized skeletal structures are unremarkable.  IMPRESSION: No active cardiopulmonary disease.   Electronically Signed   By: Inez Catalina M.D.   On: 06/23/2016 14:15     ____________________________________________   PROCEDURES  Procedure(s) performed:   Procedures  Critical Care performed:  ____________________________________________   INITIAL IMPRESSION / ASSESSMENT AND PLAN / ED COURSE  Pertinent labs & imaging results that were available during my care of the patient were reviewed by me and considered in my medical decision making (see chart for details).        ____________________________________________   FINAL CLINICAL IMPRESSION(S) / ED DIAGNOSES  Final diagnoses:  None      NEW MEDICATIONS STARTED DURING THIS VISIT:  New Prescriptions   No medications on file     Note:  This document was prepared using Dragon voice recognition software and may include unintentional dictation errors.    Nena Polio, MD 06/23/16 1535

## 2016-06-23 NOTE — Discharge Instructions (Signed)

## 2016-06-23 NOTE — ED Triage Notes (Signed)
Per [facility], patient comes from home with c/o CP that started yesterday. EMS reports patient had 4 stents placed and 2 MI's. Patient states, "Its more my anxiety. I can't do this anymore. I don't want to be here anymore. I hate having to come here".

## 2016-06-23 NOTE — ED Provider Notes (Signed)
Patient has been calm, appropriate throughout the remainder of his ER stay. He has been seen by psychiatry was cleared him for discharge. Patient comfortable with plan to go home and follow-up with outpatient.  Return precautions and treatment recommendations and follow-up discussed with the patient who is agreeable with the plan.    Delman Kitten, MD 06/23/16 [phone]

## 2016-06-24 ENCOUNTER — Emergency Department
Admission: EM | Admit: 2016-06-24 | Discharge: 2016-06-24 | Disposition: A | Discharge status: Home / Self Care | Payer: Medicare Other | Attending: Emergency Medicine | Admitting: Emergency Medicine

## 2016-06-24 ENCOUNTER — Encounter: Payer: Self-pay | Admitting: Emergency Medicine

## 2016-06-24 DIAGNOSIS — I2510 Atherosclerotic heart disease of native coronary artery without angina pectoris: Secondary | ICD-10-CM | POA: Insufficient documentation

## 2016-06-24 DIAGNOSIS — F419 Anxiety disorder, unspecified: Secondary | ICD-10-CM | POA: Insufficient documentation

## 2016-06-24 DIAGNOSIS — I129 Hypertensive chronic kidney disease with stage 1 through stage 4 chronic kidney disease, or unspecified chronic kidney disease: Secondary | ICD-10-CM | POA: Insufficient documentation

## 2016-06-24 DIAGNOSIS — N189 Chronic kidney disease, unspecified: Secondary | ICD-10-CM | POA: Insufficient documentation

## 2016-06-24 DIAGNOSIS — E1122 Type 2 diabetes mellitus with diabetic chronic kidney disease: Secondary | ICD-10-CM | POA: Insufficient documentation

## 2016-06-24 DIAGNOSIS — Z7982 Long term (current) use of aspirin: Secondary | ICD-10-CM | POA: Insufficient documentation

## 2016-06-24 DIAGNOSIS — F17210 Nicotine dependence, cigarettes, uncomplicated: Secondary | ICD-10-CM | POA: Insufficient documentation

## 2016-06-24 DIAGNOSIS — Z79899 Other long term (current) drug therapy: Secondary | ICD-10-CM

## 2016-06-24 DIAGNOSIS — I252 Old myocardial infarction: Secondary | ICD-10-CM | POA: Insufficient documentation

## 2016-06-24 DIAGNOSIS — F17290 Nicotine dependence, other tobacco product, uncomplicated: Secondary | ICD-10-CM | POA: Insufficient documentation

## 2016-06-24 LAB — COMPREHENSIVE METABOLIC PANEL
ALT: 36 U/L (ref 17–63)
AST: 23 U/L (ref 15–41)
Albumin: 4.1 g/dL (ref 3.5–5.0)
Alkaline Phosphatase: 115 U/L (ref 38–126)
Anion gap: 9 (ref 5–15)
BUN: 11 mg/dL (ref 6–20)
CALCIUM: 9.5 mg/dL (ref 8.9–10.3)
CHLORIDE: 104 mmol/L (ref 101–111)
CO2: 24 mmol/L (ref 22–32)
CREATININE: 0.82 mg/dL (ref 0.61–1.24)
GFR calc non Af Amer: >60 mL/min (ref >60)
GLUCOSE: 121 mg/dL — AB (ref 65–99)
POTASSIUM: 4 mmol/L (ref 3.5–5.1)
SODIUM: 137 mmol/L (ref 135–145)
Total Bilirubin: 0.5 mg/dL (ref 0.3–1.2)
Total Protein: 6.9 g/dL (ref 6.5–8.1)

## 2016-06-24 LAB — CBC
HCT: 44.6 % (ref 40.0–52.0)
Hemoglobin: 15 g/dL (ref 13.0–18.0)
MCH: 29.7 pg (ref 26.0–34.0)
MCHC: 33.6 g/dL (ref 32.0–36.0)
MCV: 88.4 fL (ref 80.0–100.0)
Platelets: 194 10*3/uL (ref 150–440)
RBC: 5.05 MIL/uL (ref 4.40–5.90)
RDW: 14.4 % (ref 11.5–14.5)
WBC: 9.4 10*3/uL (ref 3.8–10.6)

## 2016-06-24 LAB — ETHANOL: Alcohol, Ethyl (B): <5 mg/dL (ref <5)

## 2016-06-24 LAB — TROPONIN I: Troponin I: <0.03 ng/mL (ref <0.03)

## 2016-06-24 NOTE — BH Assessment (Signed)
Assessment Note  Michael Schmitt is an 46 y.o. male. Pt was discharged recently from [facility] and reports he has since been admitted to [facility] and was discharged Wednesday.  Pt reports he continues to have problems with his anxiety and that it "won't go away and never gets better."  Pt reports he told them he did not want to be discharged from [facility] and they discharged him anyway.  Pt reports "I think about hanging myself all the time but I would never do it because of my kids."  Pt denies HI, AV.  Pt has history of significant alcohol use and has 2 pending DUI charges.  Diagnosis: anxiety disorder  Past Medical History:  Past Medical History:  Diagnosis Date  . Anxiety   . Anxiety   . Arthritis    cerv. stenosis, spondylosis, HNP- lower back , has been followed in pain clinic, has  had injection s in cerv. area  . Blood dyscrasia    told that when he was young he was a" free bleeder"  . CAD (coronary artery disease)   . Cervical spondylosis without myelopathy 07/24/2014  . Cervicogenic headache 07/24/2014  . Chronic kidney disease    renal calculi- passed spontaneously  . Depression   . Diabetes mellitus without complication ([place])   . Fatty liver   . GERD (gastroesophageal reflux disease)   . Headache(784.0)   . Hyperlipidemia   . Hypertension   . Mental disorder   . MI, old   . RLS (restless legs syndrome)    detected on sleep study  . Shortness of breath   . Ventricular fibrillation ([place]) 04/05/2016    Past Surgical History:  Procedure Laterality Date  . ANTERIOR CERVICAL DECOMP/DISCECTOMY FUSION  11/13/2011   Procedure: ANTERIOR CERVICAL DECOMPRESSION/DISCECTOMY FUSION 1 LEVEL;  Surgeon: [name], MD;  Location: [facility]UR[facility] ORS;  Service: Neurosurgery;  Laterality: N/A;  Anterior Cervical Decompression/discectomy Fusion. Cervical three-four.  Marland Kitchen CARDIAC CATHETERIZATION N/A 01/01/2015   Procedure: Left Heart Cath and Coronary Angiography;  Surgeon: [name], MD;   Location: [facility] CV LAB;  Service: Cardiovascular;  Laterality: N/A;  . CARDIAC CATHETERIZATION N/A 04/05/2016   Procedure: Left Heart Cath and Coronary Angiography;  Surgeon: Belva Crome, MD;  Location: [place] CV LAB;  Service: Cardiovascular;  Laterality: N/A;  . CARDIAC CATHETERIZATION N/A 04/05/2016   Procedure: Coronary Stent Intervention;  Surgeon: Belva Crome, MD;  Location: [place] CV LAB;  Service: Cardiovascular;  Laterality: N/A;  . CARDIAC CATHETERIZATION N/A 04/05/2016   Procedure: Intravascular Ultrasound/IVUS;  Surgeon: Belva Crome, MD;  Location: [place] CV LAB;  Service: Cardiovascular;  Laterality: N/A;  . NASAL SINUS SURGERY     2005    Family History:  Family History  Problem Relation Age of Onset  . Prostate cancer Father   . Hypertension Mother   . Kidney Stones Mother   . Anxiety disorder Mother   . Depression Mother   . COPD Sister   . Hypertension Sister   . Diabetes Sister   . Depression Sister   . Anxiety disorder Sister   . Seizures Sister     Social History:  reports that he has been smoking Cigarettes and Cigars.  He has a 34.00 pack-year smoking history. He has quit using smokeless tobacco. He reports that he drinks alcohol. He reports that he does not use drugs.  Additional Social History:  Alcohol / Drug Use Pain Medications: pt denies Prescriptions: pt  denies Over the Counter: pt denies History of alcohol / drug use?: Yes Substance #1 Name of Substance 1: Alcohol 1 - Amount (size/oz): Pt has history of significant alcohol use, 2 pending DWI.  Pt reports he has not had any alcohol since 05/12/16.  CIWA: CIWA-Ar BP: (!) 130/93 Pulse Rate: 68 COWS:    Allergies:  Allergies  Allergen Reactions  . Prednisone Other (See Comments)    Hypertension, makes him feel spacey  . Hydrocodone Other (See Comments)    Headache, irritable  . Varenicline Other (See Comments)    Suicidal thoughts    Home Medications:  (Not in a  hospital admission)  OB/GYN Status:  No LMP for male patient.  General Assessment Data Location of Assessment: [facility] ED TTS Assessment: In system Is this a Tele or Face-to-Face Assessment?: Face-to-Face Is this an Initial Assessment or a Re-assessment for this encounter?: Initial Assessment Marital status: Single Is patient pregnant?: No Pregnancy Status: No Living Arrangements: Parent, Other relatives, Children Can pt return to current living arrangement?: Yes Admission Status: Voluntary     Crisis Care Plan Living Arrangements: Parent, Other relatives, Children Name of Psychiatrist: Dr. Myer Haff Name of Therapist: [place] to self with the past 6 months Suicidal Ideation: No-Not Currently/Within Last 6 Months ("I think about hanging myself all the time.") Has patient been a risk to self within the past 6 months prior to admission? : Yes Suicidal Intent: No Has patient had any suicidal intent within the past 6 months prior to admission? : No Is patient at risk for suicide?: No Suicidal Plan?: No-Not Currently/Within Last 6 Months Has patient had any suicidal plan within the past 6 months prior to admission? : Yes What has been your use of drugs/alcohol within the last 12 months?: history of problem alcohol use Previous Attempts/Gestures: No Intentional Self Injurious Behavior: None Family Suicide History: No Recent stressful life event(s):  (none, per pt) Persecutory voices/beliefs?: No Depression: No Substance abuse history and/or treatment for substance abuse?: Yes  Risk to Others within the past 6 months Homicidal Ideation: No Does patient have any lifetime risk of violence toward others beyond the six months prior to admission? : No Thoughts of Harm to Others: No Current Homicidal Intent: No Current Homicidal Plan: No Access to Homicidal Means: No History of harm to others?: No Assessment of Violence: None Noted Does patient have access  to weapons?: No Criminal Charges Pending?: Yes Describe Pending Criminal Charges: 2 DUI charges Does patient have a court date: Yes Court Date: 07/14/16 Is patient on probation?: No  Psychosis Hallucinations: None noted Delusions: None noted  Mental Status Report Appearance/Hygiene: Unremarkable Eye Contact: Good Motor Activity: Unremarkable Speech: Logical/coherent Level of Consciousness: Alert Mood: Anxious Affect: Anxious Anxiety Level: Moderate Thought Processes: Coherent, Relevant Judgement: Unimpaired Orientation: Person, Place, Time, Situation Obsessive Compulsive Thoughts/Behaviors: None  Cognitive Functioning Concentration: Normal Memory: Recent Intact, Remote Intact IQ: Average Insight: Poor Impulse Control: Unable to Assess Appetite: Poor Weight Loss:  (unsure) Weight Gain: 0 Sleep: Decreased Total Hours of Sleep:  (varies) Vegetative Symptoms: None  ADLScreening [facility] Assessment Services) Patient's cognitive ability adequate to safely complete daily activities?: Yes Patient able to express need for assistance with ADLs?: Yes Independently performs ADLs?: Yes (appropriate for developmental age)  Prior Inpatient Therapy Prior Inpatient Therapy: Yes Prior Therapy Dates: 05/2016 Prior Therapy Facilty/Provider(s): [facility] [place] Reason for Treatment: anxiety  Prior Outpatient Therapy Prior Outpatient Therapy: Yes  Prior Therapy Dates: Current Prior Therapy Facilty/Provider(s): [facility] Reason for Treatment: Anxiety and Depression Does patient have an ACCT team?: No Does patient have Intensive In-House Services?  : No Does patient have [facility] services? : No Does patient have P4CC services?: No  ADL Screening (condition at time of admission) Patient's cognitive ability adequate to safely complete daily activities?: Yes Patient able to express need for assistance with ADLs?: Yes Independently performs ADLs?: Yes  (appropriate for developmental age)             Regulatory affairs officer ([place]) Does Patient Have a Medical Advance Directive?: No    Additional Information 1:1 In Past 12 Months?: No CIRT Risk: No Elopement Risk: No Does patient have medical clearance?: Yes     Disposition: TTS came out from assessment and was informed by RN that [facility] had recommended discharge and they were working to discharge pt currently. Disposition Initial Assessment Completed for this Encounter: Yes Disposition of Patient: Other dispositions (Current provider) Other disposition(s): To current provider  On Site Evaluation by:   Reviewed with Physician:    Joanne Chars 06/24/2016 8:27 PM

## 2016-06-24 NOTE — ED Notes (Signed)
Per MD Kinner, no labs needed at this time. Pt was recently [facility] and results are on file.

## 2016-06-24 NOTE — ED Triage Notes (Signed)
Pt states that he does not remember being discharged from our facility last evening. Pt states that he is feeling very anxious and does not know what else to do at this time. Pt denies wanting to hurt himself. Pt is ambulatory to triage with NAD noted at this time.

## 2016-06-24 NOTE — ED Notes (Signed)

## 2016-06-24 NOTE — ED Notes (Signed)
Report was received from [place]., RN; Pt. Verbalizes no complaints or distress; denies S.I./Hi. Continue to monitor with 15 min. Monitoring.

## 2016-06-24 NOTE — ED Notes (Signed)
Patient having tele psych assessment at present.

## 2016-06-24 NOTE — ED Notes (Signed)

## 2016-06-24 NOTE — ED Notes (Signed)
Pt just discharged from ED [facility]. Pt up to the registration desk stating he can not go home because he feels suicidal. Pt states he "just needs some help."

## 2016-06-24 NOTE — ED Notes (Signed)
Patient stating that if he goes home he feels like he will snap.  Patient denying SI/HI at this time, but unsure what will happen if he goes home, feeling very anxious.  Patient being dressed into hospital scrubs by Edison Nasuti, tech and belongings placed into belongings bag and secured.

## 2016-06-24 NOTE — Progress Notes (Signed)
LCSW met with patient. It became apparent he was having some anxiety today. He was assured that he has been improving over the last month. He was reminded of all the community supports in place- [employer], [facility] Evelena Peat, He admits he is taking his medications  Each day and his mother and sister are helping him, he does not understand why he just cant shake this feeling.  LCSW provided support and he was informed they will run some blood work and if he is medically clear he is free to go home and be with his children for Easter.   No further needs at this time.    Ralphie Lovelady LCSW

## 2016-06-24 NOTE — ED Triage Notes (Addendum)
Pt states he was recently discharged from [facility], pt states that his luvox was increased and also states that every time he stands he feels like he is going to fall backwards, light headed, pt denies SI, states "no, I don't want to hurt myself, I want to feel better, I'm tired of feeling this way, my children are wanting me to come outside and play and I just don't feel well enough to do that and it hurts my heart", pt is tearful when talking about his children. Pt also states that he thinks that the neurontin maybe causing him to feel this way also,states that his father told him neurontin made him suicidal so pt is concerned the neurontin maybe making him feel "off"

## 2016-06-24 NOTE — ED Provider Notes (Addendum)
[facility] Emergency Department Provider Note   ____________________________________________    I have reviewed the triage vital signs and the nursing notes.   HISTORY  Chief Complaint Anxiety     HPI Michael Schmitt is a 46 y.o. male who presents with complaints of anxiety. Patient well-known to our emergency department presents frequently with similar complaints. He was here yesterday and reports psychiatrist increased his Luvox, he states he has also been having dizziness when he stands up which she attributed to being severely anxious. He requests Ativan for his anxiety. He denies chest pain. No shortness of breath. No palpitations.   Past Medical History:  Diagnosis Date  . Anxiety   . Anxiety   . Arthritis    cerv. stenosis, spondylosis, HNP- lower back , has been followed in pain clinic, has  had injection s in cerv. area  . Blood dyscrasia    told that when he was young he was a" free bleeder"  . CAD (coronary artery disease)   . Cervical spondylosis without myelopathy 07/24/2014  . Cervicogenic headache 07/24/2014  . Chronic kidney disease    renal calculi- passed spontaneously  . Depression   . Diabetes mellitus without complication ([place])   . Fatty liver   . GERD (gastroesophageal reflux disease)   . Headache(784.0)   . Hyperlipidemia   . Hypertension   . Mental disorder   . MI, old   . RLS (restless legs syndrome)    detected on sleep study  . Shortness of breath   . Ventricular fibrillation ([place]) 04/05/2016    Patient Active Problem List   Diagnosis Date Noted  . Phencyclidine (PCP) use disorder, severe, dependence ([place]) 05/28/2016  . Severe recurrent major depression without psychotic features ([place]) 05/26/2016  . Alcohol abuse 05/12/2016  . Substance induced mood disorder ([place]) 05/12/2016  . Sedative, hypnotic or anxiolytic use disorder, severe, dependence ([place]) 05/12/2016  . Ventricular fibrillation ([place]) 04/05/2016    . Coronary stent thrombosis   . Generalized anxiety disorder 08/11/2015  . Major depressive disorder, recurrent episode, moderate with anxious distress ([place]) 06/21/2015  . NSTEMI (non-ST elevated myocardial infarction) ([place]) 06/20/2015  . Acute ST elevation myocardial infarction (STEMI) involving left anterior descending (LAD) coronary artery ([place]) 01/01/2015  . Acute MI, inferoposterior wall ([place]) 01/01/2015  . Common bile duct dilation 10/06/2014  . Right lower quadrant abdominal pain 10/06/2014  . Loss of weight 10/06/2014  . Cervicogenic headache 07/24/2014  . Cervical spondylosis without myelopathy 07/24/2014  . Clinical depression 04/15/2013  . Acid reflux 04/15/2013  . BP (high blood pressure) 04/15/2013  . Encounter for other preprocedural examination 04/15/2013  . Nasal obstruction 02/12/2013  . Inflamed nasal mucosa 02/12/2013  . Sinus infection 02/12/2013  . Gastric catarrh 03/01/2011  . Allergic rhinitis 10/20/2010  . Airway hyperreactivity 10/20/2010  . Anxiety, generalized 10/20/2010  . Cervical pain 10/20/2010  . Tobacco use disorder 10/20/2010    Past Surgical History:  Procedure Laterality Date  . ANTERIOR CERVICAL DECOMP/DISCECTOMY FUSION  11/13/2011   Procedure: ANTERIOR CERVICAL DECOMPRESSION/DISCECTOMY FUSION 1 LEVEL;  Surgeon: [name], MD;  Location: [place] NEURO ORS;  Service: Neurosurgery;  Laterality: N/A;  Anterior Cervical Decompression/discectomy Fusion. Cervical three-four.  Marland Kitchen CARDIAC CATHETERIZATION N/A 01/01/2015   Procedure: Left Heart Cath and Coronary Angiography;  Surgeon: [name], MD;  Location: [place] CV LAB;  Service: Cardiovascular;  Laterality: N/A;  . CARDIAC CATHETERIZATION N/A 04/05/2016   Procedure: Left Heart Cath and Coronary Angiography;  Surgeon: Belva Crome, MD;  Location: [place] CV LAB;  Service: Cardiovascular;  Laterality: N/A;  . CARDIAC CATHETERIZATION N/A 04/05/2016   Procedure: Coronary Stent Intervention;   Surgeon: Belva Crome, MD;  Location: [place] CV LAB;  Service: Cardiovascular;  Laterality: N/A;  . CARDIAC CATHETERIZATION N/A 04/05/2016   Procedure: Intravascular Ultrasound/IVUS;  Surgeon: Belva Crome, MD;  Location: [place] CV LAB;  Service: Cardiovascular;  Laterality: N/A;  . NASAL SINUS SURGERY     2005    Prior to Admission medications   Medication Sig Start Date End Date Taking? Authorizing Provider  aspirin 81 MG EC tablet Take 1 tablet (81 mg total) by mouth daily. 06/02/16   Clovis Fredrickson, MD  atorvastatin (LIPITOR) 80 MG tablet Take 80 mg by mouth every evening.    Historical Provider, MD  baclofen (LIORESAL) 10 MG tablet Take 10 mg by mouth 2 (two) times daily as needed for muscle spasms.    Historical Provider, MD  benztropine (COGENTIN) 1 MG tablet Take 1 mg by mouth daily as needed for tremors.    Historical Provider, MD  carvedilol (COREG) 6.25 MG tablet Take 6.25 mg by mouth 2 (two) times daily. 05/07/16   Historical Provider, MD  fluvoxaMINE (LUVOX) 100 MG tablet Take 1 tablet (100 mg total) by mouth at bedtime. 06/02/16   Clovis Fredrickson, MD  gabapentin (NEURONTIN) 400 MG capsule Take 400 mg by mouth 3 (three) times daily.    Historical Provider, MD  hydrOXYzine (ATARAX/VISTARIL) 25 MG tablet Take 1 tablet (25 mg total) by mouth 3 (three) times daily as needed for anxiety. 06/02/16   Clovis Fredrickson, MD  naltrexone (DEPADE) 50 MG tablet Take 50 mg by mouth daily.    Historical Provider, MD  nitroGLYCERIN (NITROSTAT) 0.4 MG SL tablet Place 0.4 mg under the tongue every 5 (five) minutes x 3 doses as needed. 05/07/16 05/07/17  Historical Provider, MD  QUEtiapine (SEROQUEL) 25 MG tablet Take 1 tablet (25 mg total) by mouth 3 (three) times daily. 06/02/16   Clovis Fredrickson, MD  QUEtiapine (SEROQUEL) 300 MG tablet Take 1 tablet (300 mg total) by mouth at bedtime. 06/02/16   Clovis Fredrickson, MD  ticagrelor (BRILINTA) 90 MG TABS tablet Take 1 tablet (90 mg  total) by mouth 2 (two) times daily. 06/02/16   Clovis Fredrickson, MD  traZODone (DESYREL) 50 MG tablet Take 50 mg by mouth at bedtime as needed for sleep.    Historical Provider, MD     Allergies Prednisone; Hydrocodone; and Varenicline  Family History  Problem Relation Age of Onset  . Prostate cancer Father   . Hypertension Mother   . Kidney Stones Mother   . Anxiety disorder Mother   . Depression Mother   . COPD Sister   . Hypertension Sister   . Diabetes Sister   . Depression Sister   . Anxiety disorder Sister   . Seizures Sister     Social History Social History  Substance Use Topics  . Smoking status: Current Every Day Smoker    Packs/day: 2.00    Years: 17.00    Types: Cigarettes, Cigars  . Smokeless tobacco: Former Systems developer     Comment: occassional snuff   . Alcohol use 0.0 oz/week     Comment: trying not to drink     Review of Systems  Constitutional: No fever/chills Eyes: No visual changes.  ENT: Chronic neck pain Cardiovascular: Denies chest pain. Respiratory: Denies shortness  of breath. Gastrointestinal: No abdominal pain.  No nausea, no vomiting.    Musculoskeletal: Chronic neck pain  Skin: Negative for rash. Neurological: Negative for headaches or weakness  10-point ROS otherwise negative.  ____________________________________________   PHYSICAL EXAM:  VITAL SIGNS: ED Triage Vitals  Enc Vitals Group     BP 06/24/16 1452 (!) 140/96     Pulse Rate 06/24/16 1452 82     Resp 06/24/16 1452 20     Temp 06/24/16 1452 98 F (36.7 C)     Temp Source 06/24/16 1452 Oral     SpO2 06/24/16 1452 98 %     Weight 06/24/16 1453 200 lb (90.7 kg)     Height 06/24/16 1453 6\' 2"  (1.88 m)     Head Circumference --      Peak Flow --      Pain Score 06/24/16 1451 7     Pain Loc --      Pain Edu? --      Excl. in [facility]? --     Constitutional: Alert and oriented. No acute distress. Eyes: Conjunctivae are normal.   Nose: No  congestion/rhinnorhea. Mouth/Throat: Mucous membranes are moist.   Neck: No vertebral tenderness to palpation Cardiovascular: Normal rate, regular rhythm. Grossly normal heart sounds.  Good peripheral circulation. Respiratory: Normal respiratory effort.  No retractions. Lungs CTAB. Gastrointestinal: Soft and nontender. No distention.   Genitourinary: deferred Musculoskeletal: No lower extremity tenderness nor edema.  Warm and well perfused Neurologic:  Normal speech and language. No gross focal neurologic deficits are appreciated.  Skin:  Skin is warm, dry and intact. No rash noted. Psychiatric:  Speech and behavior are normal.  ____________________________________________   LABS (all labs ordered are listed, but only abnormal results are displayed)  Labs Reviewed  COMPREHENSIVE METABOLIC PANEL - Abnormal; Notable for the following:       Result Value   Glucose, Bld 121 (*)    All other components within normal limits  CBC  TROPONIN I  ETHANOL   ____________________________________________  EKG  ED ECG REPORT I, Lavonia Drafts, the attending physician, personally viewed and interpreted this ECG.  Date: 06/24/2016  Rate: 79 Rhythm: normal sinus rhythm QRS Axis: normal Intervals: normal ST/T Wave abnormalities: T-wave inversions laterally, unchanged from prior EKGs Conduction Disturbances: none   ____________________________________________  RADIOLOGY   ____________________________________________   PROCEDURES  Procedure(s) performed: No    Critical Care performed: No ____________________________________________   INITIAL IMPRESSION / ASSESSMENT AND PLAN / ED COURSE  Pertinent labs & imaging results that were available during my care of the patient were reviewed by me and considered in my medical decision making (see chart for details).  Patient presents with complaints of anxiety. Also complains of dizziness but this seems to be related to his anxiety  per the patient. We will check labs, including troponin and EKG given his history of heart disease however I suspect these will be unremarkable and he'll be appropriate for discharge. He has significant outpatient resources available to him. He has no SI or HI and does not meet criteria for involuntary commitment   Lab work is unremarkable.  Went to discuss with patient that his labs are normal and now he states "he is going to snap" and is requesting to see a psychiatrist.   I will consult [place] and psych. He remains voluntary at this time.   Will transfer to [facility] by psychiatrist and cleared for discharge    ____________________________________________   FINAL CLINICAL  IMPRESSION(S) / ED DIAGNOSES  Final diagnoses:  Anxiety      NEW MEDICATIONS STARTED DURING THIS VISIT:  New Prescriptions   No medications on file     Note:  This document was prepared using Dragon voice recognition software and may include unintentional dictation errors.    Lavonia Drafts, MD 06/24/16 1742    Lavonia Drafts, MD 06/24/16 [phone]

## 2016-06-25 ENCOUNTER — Emergency Department
Admission: EM | Admit: 2016-06-25 | Discharge: 2016-06-25 | Disposition: A | Discharge status: Other Acute Inpatient Hospital | Payer: Medicare Other | Source: Home / Self Care | Attending: Emergency Medicine | Admitting: Emergency Medicine

## 2016-06-25 ENCOUNTER — Encounter: Payer: Self-pay | Admitting: Emergency Medicine

## 2016-06-25 ENCOUNTER — Inpatient Hospital Stay
Admission: AD | Admit: 2016-06-25 | Discharge: 2016-07-03 | DRG: 885 | Disposition: A | Discharge status: Home / Self Care | Payer: Medicare Other | Source: Ambulatory Visit | Attending: Psychiatry | Admitting: Psychiatry

## 2016-06-25 ENCOUNTER — Emergency Department
Admission: EM | Admit: 2016-06-25 | Discharge: 2016-06-25 | Disposition: A | Discharge status: Home / Self Care | Payer: Medicare Other | Source: Home / Self Care | Attending: Emergency Medicine | Admitting: Emergency Medicine

## 2016-06-25 DIAGNOSIS — I252 Old myocardial infarction: Secondary | ICD-10-CM | POA: Diagnosis not present

## 2016-06-25 DIAGNOSIS — I10 Essential (primary) hypertension: Secondary | ICD-10-CM

## 2016-06-25 DIAGNOSIS — Z79899 Other long term (current) drug therapy: Secondary | ICD-10-CM

## 2016-06-25 DIAGNOSIS — F1620 Hallucinogen dependence, uncomplicated: Secondary | ICD-10-CM | POA: Diagnosis present

## 2016-06-25 DIAGNOSIS — Z8249 Family history of ischemic heart disease and other diseases of the circulatory system: Secondary | ICD-10-CM | POA: Diagnosis not present

## 2016-06-25 DIAGNOSIS — G2581 Restless legs syndrome: Secondary | ICD-10-CM | POA: Diagnosis present

## 2016-06-25 DIAGNOSIS — E119 Type 2 diabetes mellitus without complications: Secondary | ICD-10-CM

## 2016-06-25 DIAGNOSIS — M47812 Spondylosis without myelopathy or radiculopathy, cervical region: Secondary | ICD-10-CM | POA: Diagnosis present

## 2016-06-25 DIAGNOSIS — K219 Gastro-esophageal reflux disease without esophagitis: Secondary | ICD-10-CM

## 2016-06-25 DIAGNOSIS — F17200 Nicotine dependence, unspecified, uncomplicated: Secondary | ICD-10-CM | POA: Diagnosis present

## 2016-06-25 DIAGNOSIS — I129 Hypertensive chronic kidney disease with stage 1 through stage 4 chronic kidney disease, or unspecified chronic kidney disease: Secondary | ICD-10-CM | POA: Diagnosis present

## 2016-06-25 DIAGNOSIS — Z888 Allergy status to other drugs, medicaments and biological substances status: Secondary | ICD-10-CM

## 2016-06-25 DIAGNOSIS — F1021 Alcohol dependence, in remission: Secondary | ICD-10-CM | POA: Diagnosis present

## 2016-06-25 DIAGNOSIS — F32A Depression, unspecified: Secondary | ICD-10-CM

## 2016-06-25 DIAGNOSIS — L600 Ingrowing nail: Secondary | ICD-10-CM | POA: Diagnosis present

## 2016-06-25 DIAGNOSIS — G4700 Insomnia, unspecified: Secondary | ICD-10-CM | POA: Diagnosis present

## 2016-06-25 DIAGNOSIS — E1122 Type 2 diabetes mellitus with diabetic chronic kidney disease: Secondary | ICD-10-CM | POA: Diagnosis present

## 2016-06-25 DIAGNOSIS — I2510 Atherosclerotic heart disease of native coronary artery without angina pectoris: Secondary | ICD-10-CM | POA: Diagnosis present

## 2016-06-25 DIAGNOSIS — G8929 Other chronic pain: Secondary | ICD-10-CM | POA: Diagnosis present

## 2016-06-25 DIAGNOSIS — F329 Major depressive disorder, single episode, unspecified: Secondary | ICD-10-CM

## 2016-06-25 DIAGNOSIS — J45909 Unspecified asthma, uncomplicated: Secondary | ICD-10-CM | POA: Diagnosis present

## 2016-06-25 DIAGNOSIS — F17210 Nicotine dependence, cigarettes, uncomplicated: Secondary | ICD-10-CM | POA: Insufficient documentation

## 2016-06-25 DIAGNOSIS — L0390 Cellulitis, unspecified: Secondary | ICD-10-CM | POA: Diagnosis present

## 2016-06-25 DIAGNOSIS — I4901 Ventricular fibrillation: Secondary | ICD-10-CM | POA: Diagnosis present

## 2016-06-25 DIAGNOSIS — F331 Major depressive disorder, recurrent, moderate: Secondary | ICD-10-CM | POA: Diagnosis not present

## 2016-06-25 DIAGNOSIS — N189 Chronic kidney disease, unspecified: Secondary | ICD-10-CM | POA: Diagnosis not present

## 2016-06-25 DIAGNOSIS — F418 Other specified anxiety disorders: Secondary | ICD-10-CM | POA: Insufficient documentation

## 2016-06-25 DIAGNOSIS — Z7982 Long term (current) use of aspirin: Secondary | ICD-10-CM | POA: Insufficient documentation

## 2016-06-25 DIAGNOSIS — F1320 Sedative, hypnotic or anxiolytic dependence, uncomplicated: Secondary | ICD-10-CM | POA: Diagnosis present

## 2016-06-25 DIAGNOSIS — Z818 Family history of other mental and behavioral disorders: Secondary | ICD-10-CM

## 2016-06-25 DIAGNOSIS — F419 Anxiety disorder, unspecified: Secondary | ICD-10-CM | POA: Diagnosis present

## 2016-06-25 DIAGNOSIS — I152 Hypertension secondary to endocrine disorders: Secondary | ICD-10-CM

## 2016-06-25 DIAGNOSIS — Z885 Allergy status to narcotic agent status: Secondary | ICD-10-CM

## 2016-06-25 DIAGNOSIS — E782 Mixed hyperlipidemia: Secondary | ICD-10-CM | POA: Diagnosis present

## 2016-06-25 DIAGNOSIS — Z833 Family history of diabetes mellitus: Secondary | ICD-10-CM

## 2016-06-25 DIAGNOSIS — Z825 Family history of asthma and other chronic lower respiratory diseases: Secondary | ICD-10-CM

## 2016-06-25 DIAGNOSIS — K760 Fatty (change of) liver, not elsewhere classified: Secondary | ICD-10-CM | POA: Diagnosis present

## 2016-06-25 DIAGNOSIS — Z87442 Personal history of urinary calculi: Secondary | ICD-10-CM | POA: Diagnosis not present

## 2016-06-25 MED ORDER — MAGNESIUM HYDROXIDE 400 MG/5ML PO SUSP: 30.0000 mL | Freq: Every day | ORAL | Status: DC | PRN

## 2016-06-25 MED ORDER — ACETAMINOPHEN 500 MG PO TABS
1000.0000 mg | ORAL_TABLET | Freq: Once | ORAL | Status: AC
Start: 2016-06-25 — End: 2016-06-25
  Administered 2016-06-25: 1000 mg via ORAL

## 2016-06-25 MED ORDER — ACETAMINOPHEN 500 MG PO TABS
ORAL_TABLET | ORAL | Status: AC
  Filled 2016-06-25: qty 2

## 2016-06-25 MED ORDER — ALUM & MAG HYDROXIDE-SIMETH 200-200-20 MG/5ML PO SUSP: 30.0000 mL | ORAL | Status: DC | PRN

## 2016-06-25 MED ORDER — TRAZODONE HCL 100 MG PO TABS
100.0000 mg | ORAL_TABLET | Freq: Every evening | ORAL | Status: DC | PRN
  Administered 2016-06-26: 100 mg via ORAL
  Filled 2016-06-25: qty 1

## 2016-06-25 MED ORDER — ACETAMINOPHEN 325 MG PO TABS
650.0000 mg | ORAL_TABLET | Freq: Four times a day (QID) | ORAL | Status: DC | PRN
  Administered 2016-06-26: 650 mg via ORAL
  Filled 2016-06-25: qty 2

## 2016-06-25 MED ORDER — NICOTINE 21 MG/24HR TD PT24
21.0000 mg | MEDICATED_PATCH | Freq: Every day | TRANSDERMAL | Status: DC
  Administered 2016-06-26 – 2016-07-03 (×7): 21 mg via TRANSDERMAL
  Filled 2016-06-25 (×9): qty 1

## 2016-06-25 NOTE — ED Notes (Addendum)
Patient's sister called the unit requesting to speak with patient. She did not have ID privacy code. Policy explained to sister. RN told sister that if patient was here, staff will give him message to call family. Spoke with patient regarding above. Patient said he did not want to talk to his sister now.

## 2016-06-25 NOTE — ED Notes (Signed)
Patient is IVC and is pending placement. 

## 2016-06-25 NOTE — ED Notes (Signed)
Pt dressed and ready for DC came to desk and asked again what do I do now? This RN informed pt another time that he needs to follow up with [facility] and get assistance there. Pt to lobby at this time. First nurse Dawn aware and will make sure that pt locates the phone for his ride home.

## 2016-06-25 NOTE — ED Notes (Signed)
Patient resting quietly in room. No noted distress or abnormal behaviors noted. Will continue 15 minute checks and observation by security camera for safety. 

## 2016-06-25 NOTE — ED Notes (Signed)
Patient reports continued anxiety with intermittent suicidal thoughts, no specific plan.  Maintained on 15 minute checks and observation by security camera for safety.

## 2016-06-25 NOTE — ED Notes (Signed)
Patient continues to rest quietly, voices no complaints.

## 2016-06-25 NOTE — ED Notes (Signed)
Pt to room and requesting "something for feeling sick". MD Archie Balboa aware and informed this RN and pt that he would be receiving no medication and will be discharged shortly.

## 2016-06-25 NOTE — ED Notes (Signed)
Report was received from Amy h., RN; Pt. Verbalizes  Complaints of having anxiety and somatic complaints; verbalizes having S.I.; denies having H.I.. Continue to monitor with 15 min. Monitoring.

## 2016-06-25 NOTE — Progress Notes (Signed)
LCSW and TTS consulted- As per [facility] patient is to be admitted  inpatient ( [facility] Dr Mamie Nick )   Enis Slipper LCSW [phone]

## 2016-06-25 NOTE — ED Notes (Signed)

## 2016-06-25 NOTE — ED Notes (Signed)
Patient resting quietly at this time.  Patient voices no complaints.

## 2016-06-25 NOTE — Discharge Instructions (Signed)
Please seek medical attention and help for any thoughts about wanting to harm yourself, harm others, any concerning change in behavior, severe depression, inappropriate drug use or any other new or concerning symptoms. ° °

## 2016-06-25 NOTE — BH Assessment (Signed)
Per [facility], patient meets inpatient criteria.   Writer spoke with [facility] Attending Physician, Dr. Bary Leriche and she will put in admissions orders.   Writer spoke [facility] BMU Charge RN Meredith Mody F.),  Pending bed assignment.

## 2016-06-25 NOTE — ED Notes (Signed)
[place] set up at bedside for Psych Consult.

## 2016-06-25 NOTE — ED Notes (Signed)
Resting quietly with eyes closed. No acute distress noted.

## 2016-06-25 NOTE — ED Notes (Signed)
Patient given meal tray and beverage.

## 2016-06-25 NOTE — ED Notes (Signed)
Pt received discharge instructions and shown three times where his follow up contact number for [facility] was located. Pt stated "but they're closed today". This RN informed pt that the rehab center does not close and he can call today or Monday morning. Pt then stated that "I don't want to hit someone" and was informed that if he chose to attempt to assault a staff member, he would be taken into custody. [facility] officers aware of discussion.

## 2016-06-25 NOTE — ED Provider Notes (Signed)
[facility] Emergency Department Provider Note   ____________________________________________   I have reviewed the triage vital signs and the nursing notes.   HISTORY  Chief Complaint Anxiety and Suicidal   History limited by: Not Limited   HPI Michael Schmitt is a 46 y.o. male who presents to the emergency department today because of continued anxiety and depression. The patient was seen today for the same symptom and checked back into the emergency department shortly afterwards. He was evaluated by psychiatry and deemed safe for outpatient evaluation. Furthermore he was seen the day before and again was evaluated by psychiatry who felt he was safe for outpatient follow-up. Patient does have an appointment scheduled in 2 days per patient. He over felt like he needed something for anxiety. Patient denies suicidal ideation   Past Medical History:  Diagnosis Date  . Anxiety   . Anxiety   . Arthritis    cerv. stenosis, spondylosis, HNP- lower back , has been followed in pain clinic, has  had injection s in cerv. area  . Blood dyscrasia    told that when he was young he was a" free bleeder"  . CAD (coronary artery disease)   . Cervical spondylosis without myelopathy 07/24/2014  . Cervicogenic headache 07/24/2014  . Chronic kidney disease    renal calculi- passed spontaneously  . Depression   . Diabetes mellitus without complication ([place])   . Fatty liver   . GERD (gastroesophageal reflux disease)   . Headache(784.0)   . Hyperlipidemia   . Hypertension   . Mental disorder   . MI, old   . RLS (restless legs syndrome)    detected on sleep study  . Shortness of breath   . Ventricular fibrillation ([place]) 04/05/2016    Patient Active Problem List   Diagnosis Date Noted  . Phencyclidine (PCP) use disorder, severe, dependence ([place]) 05/28/2016  . Severe recurrent major depression without psychotic features ([place]) 05/26/2016  . Alcohol abuse 05/12/2016  .  Substance induced mood disorder ([place]) 05/12/2016  . Sedative, hypnotic or anxiolytic use disorder, severe, dependence ([place]) 05/12/2016  . Ventricular fibrillation ([place]) 04/05/2016  . Coronary stent thrombosis   . Generalized anxiety disorder 08/11/2015  . Major depressive disorder, recurrent episode, moderate with anxious distress ([place]) 06/21/2015  . NSTEMI (non-ST elevated myocardial infarction) ([place]) 06/20/2015  . Acute ST elevation myocardial infarction (STEMI) involving left anterior descending (LAD) coronary artery ([place]) 01/01/2015  . Acute MI, inferoposterior wall ([place]) 01/01/2015  . Common bile duct dilation 10/06/2014  . Right lower quadrant abdominal pain 10/06/2014  . Loss of weight 10/06/2014  . Cervicogenic headache 07/24/2014  . Cervical spondylosis without myelopathy 07/24/2014  . Clinical depression 04/15/2013  . Acid reflux 04/15/2013  . BP (high blood pressure) 04/15/2013  . Encounter for other preprocedural examination 04/15/2013  . Nasal obstruction 02/12/2013  . Inflamed nasal mucosa 02/12/2013  . Sinus infection 02/12/2013  . Gastric catarrh 03/01/2011  . Allergic rhinitis 10/20/2010  . Airway hyperreactivity 10/20/2010  . Anxiety, generalized 10/20/2010  . Cervical pain 10/20/2010  . Tobacco use disorder 10/20/2010    Past Surgical History:  Procedure Laterality Date  . ANTERIOR CERVICAL DECOMP/DISCECTOMY FUSION  11/13/2011   Procedure: ANTERIOR CERVICAL DECOMPRESSION/DISCECTOMY FUSION 1 LEVEL;  Surgeon: [name], MD;  Location: [place] [facility];  Service: Neurosurgery;  Laterality: N/A;  Anterior Cervical Decompression/discectomy Fusion. Cervical three-four.  Marland Kitchen CARDIAC CATHETERIZATION N/A 01/01/2015   Procedure: Left Heart Cath and Coronary Angiography;  Surgeon: [name],  MD;  Location: [place] [facility];  Service: Cardiovascular;  Laterality: N/A;  . CARDIAC CATHETERIZATION N/A 04/05/2016   Procedure: Left Heart Cath and Coronary Angiography;  Surgeon:  Belva Crome, MD;  Location: [place] [facility];  Service: Cardiovascular;  Laterality: N/A;  . CARDIAC CATHETERIZATION N/A 04/05/2016   Procedure: Coronary Stent Intervention;  Surgeon: Belva Crome, MD;  Location: [place] [facility];  Service: Cardiovascular;  Laterality: N/A;  . CARDIAC CATHETERIZATION N/A 04/05/2016   Procedure: Intravascular Ultrasound/IVUS;  Surgeon: Belva Crome, MD;  Location: [place] [facility];  Service: Cardiovascular;  Laterality: N/A;  . NASAL SINUS SURGERY     2005    Prior to Admission medications   Medication Sig Start Date End Date Taking? Authorizing Provider  aspirin 81 MG EC tablet Take 1 tablet (81 mg total) by mouth daily. 06/02/16  Yes Jolanta B Pucilowska, MD  atorvastatin (LIPITOR) 80 MG tablet Take 80 mg by mouth every evening.   Yes Historical Provider, MD  baclofen (LIORESAL) 10 MG tablet Take 10 mg by mouth 2 (two) times daily as needed for muscle spasms.   Yes Historical Provider, MD  benztropine (COGENTIN) 1 MG tablet Take 1 mg by mouth daily as needed for tremors.   Yes Historical Provider, MD  carvedilol (COREG) 6.25 MG tablet Take 6.25 mg by mouth 2 (two) times daily. 05/07/16  Yes Historical Provider, MD  fluvoxaMINE (LUVOX) 100 MG tablet Take 1 tablet (100 mg total) by mouth at bedtime. 06/02/16  Yes Jolanta B Pucilowska, MD  gabapentin (NEURONTIN) 400 MG capsule Take 400 mg by mouth 3 (three) times daily.   Yes Historical Provider, MD  hydrOXYzine (ATARAX/VISTARIL) 25 MG tablet Take 1 tablet (25 mg total) by mouth 3 (three) times daily as needed for anxiety. 06/02/16  Yes Jolanta B Pucilowska, MD  naltrexone (DEPADE) 50 MG tablet Take 50 mg by mouth daily.   Yes Historical Provider, MD  nitroGLYCERIN (NITROSTAT) 0.4 MG SL tablet Place 0.4 mg under the tongue every 5 (five) minutes x 3 doses as needed. 05/07/16 05/07/17 Yes Historical Provider, MD  QUEtiapine (SEROQUEL) 25 MG tablet Take 1 tablet (25 mg total) by mouth 3 (three) times daily. 06/02/16   Yes Jolanta B Pucilowska, MD  QUEtiapine (SEROQUEL) 300 MG tablet Take 1 tablet (300 mg total) by mouth at bedtime. 06/02/16  Yes Jolanta B Pucilowska, MD  ticagrelor (BRILINTA) 90 MG TABS tablet Take 1 tablet (90 mg total) by mouth 2 (two) times daily. 06/02/16  Yes Jolanta B Pucilowska, MD  traZODone (DESYREL) 50 MG tablet Take 50 mg by mouth at bedtime as needed for sleep.   Yes Historical Provider, MD    Allergies Prednisone; Hydrocodone; and Varenicline  Family History  Problem Relation Age of Onset  . Prostate cancer Father   . Hypertension Mother   . Kidney Stones Mother   . Anxiety disorder Mother   . Depression Mother   . COPD Sister   . Hypertension Sister   . Diabetes Sister   . Depression Sister   . Anxiety disorder Sister   . Seizures Sister     Social History Social History  Substance Use Topics  . Smoking status: Current Every Day Smoker    Packs/day: 2.00    Years: 17.00    Types: Cigarettes, Cigars  . Smokeless tobacco: Former Systems developer     Comment: occassional snuff   . Alcohol use 0.0 oz/week     Comment: trying not to  drink     Review of Systems  Constitutional: Negative for fever. Cardiovascular: Negative for chest pain. Respiratory: Negative for shortness of breath. Gastrointestinal: Negative for abdominal pain, vomiting and diarrhea. Positive for nausea.  Genitourinary: Negative for dysuria. Musculoskeletal: Negative for back pain. Skin: Negative for rash. Neurological: Negative for headaches, focal weakness or numbness.  10-point ROS otherwise negative.  ____________________________________________   PHYSICAL EXAM:  VITAL SIGNS: ED Triage Vitals  Enc Vitals Group     BP 06/24/16 2133 (!) 141/100     Pulse Rate 06/24/16 2133 87     Resp 06/24/16 2133 18     Temp 06/24/16 2133 98.7 F (37.1 C)     Temp Source 06/24/16 2133 Oral     SpO2 06/24/16 2133 99 %     Weight 06/24/16 2133 200 lb (90.7 kg)     Height 06/24/16 2133 6\' 2"  (1.88 m)      Head Circumference --      Peak Flow --      Pain Score 06/24/16 2141 10   Constitutional: Alert and oriented. Well appearing and in no distress. Eyes: Conjunctivae are normal. Normal extraocular movements. ENT   Head: Normocephalic and atraumatic.   Nose: No congestion/rhinnorhea.   Mouth/Throat: Mucous membranes are moist.   Neck: No stridor. Hematological/Lymphatic/Immunilogical: No cervical lymphadenopathy. Cardiovascular: Normal rate, regular rhythm.  No murmurs, rubs, or gallops. Respiratory: Normal respiratory effort without tachypnea nor retractions. Breath sounds are clear and equal bilaterally. No wheezes/rales/rhonchi. Gastrointestinal: Soft and non tender. No rebound. No guarding.  Genitourinary: Deferred Musculoskeletal: Normal range of motion in all extremities. No lower extremity edema. Neurologic:  Normal speech and language. No gross focal neurologic deficits are appreciated.  Skin:  Skin is warm, dry and intact. No rash noted. Psychiatric: Slightly anxious. Denies SI.  ____________________________________________    LABS (pertinent positives/negatives)  None  ____________________________________________   EKG  None  ____________________________________________    RADIOLOGY  None  ____________________________________________   PROCEDURES  Procedures  ____________________________________________   INITIAL IMPRESSION / ASSESSMENT AND PLAN / ED COURSE  Pertinent labs & imaging results that were available during my care of the patient were reviewed by me and considered in my medical decision making (see chart for details).  Patient presents to the emergency department today with continued concerns for anxiety. Patient has been evaluated by psychiatry and past 2 days and is been deemed safe for outpatient follow up both times. No new or change in the patient's symptoms. This point do not feel that patient requires another emergent  psychiatric evaluation. Will discharge patient to follow up at scheduled outpatient appointment in 2 days.  ____________________________________________   FINAL CLINICAL IMPRESSION(S) / ED DIAGNOSES  Final diagnoses:  Anxiety     Note: This dictation was prepared with Dragon dictation. Any transcriptional errors that result from this process are unintentional     Nance Pear, MD 06/25/16 [phone]

## 2016-06-25 NOTE — ED Notes (Signed)
Continues to rest quietly without complaint.

## 2016-06-25 NOTE — ED Provider Notes (Signed)
[facility] Emergency Department Provider Note   ____________________________________________   I have reviewed the triage vital signs and the nursing notes.   HISTORY  Chief Complaint Suicidal   History limited by: Not Limited   HPI Michael Schmitt is a 46 y.o. male who presents to the emergency department today because of concerns for suicidal ideation. The patient presents under IVC paperwork. I evaluated the patient last night. At that time he is complaining of anxiety. He now states that he is suicidal with plans to hang himself. He states he cannot be honest with me or psychiatrist in the past because he was worried that no one would be able to take cares for his children. He states that since being discharged she is now arranged for his sister to take care of his children so he can now be honest with providers.   Past Medical History:  Diagnosis Date  . Anxiety   . Anxiety   . Arthritis    cerv. stenosis, spondylosis, HNP- lower back , has been followed in pain clinic, has  had injection s in cerv. area  . Blood dyscrasia    told that when he was young he was a" free bleeder"  . CAD (coronary artery disease)   . Cervical spondylosis without myelopathy 07/24/2014  . Cervicogenic headache 07/24/2014  . Chronic kidney disease    renal calculi- passed spontaneously  . Depression   . Diabetes mellitus without complication ([place])   . Fatty liver   . GERD (gastroesophageal reflux disease)   . Headache(784.0)   . Hyperlipidemia   . Hypertension   . Mental disorder   . MI, old   . RLS (restless legs syndrome)    detected on sleep study  . Shortness of breath   . Ventricular fibrillation ([place]) 04/05/2016    Patient Active Problem List   Diagnosis Date Noted  . Phencyclidine (PCP) use disorder, severe, dependence ([place]) 05/28/2016  . Severe recurrent major depression without psychotic features ([place]) 05/26/2016  . Alcohol abuse 05/12/2016  .  Substance induced mood disorder ([place]) 05/12/2016  . Sedative, hypnotic or anxiolytic use disorder, severe, dependence ([place]) 05/12/2016  . Ventricular fibrillation ([place]) 04/05/2016  . Coronary stent thrombosis   . Generalized anxiety disorder 08/11/2015  . Major depressive disorder, recurrent episode, moderate with anxious distress ([place]) 06/21/2015  . NSTEMI (non-ST elevated myocardial infarction) ([place]) 06/20/2015  . Acute ST elevation myocardial infarction (STEMI) involving left anterior descending (LAD) coronary artery ([place]) 01/01/2015  . Acute MI, inferoposterior wall ([place]) 01/01/2015  . Common bile duct dilation 10/06/2014  . Right lower quadrant abdominal pain 10/06/2014  . Loss of weight 10/06/2014  . Cervicogenic headache 07/24/2014  . Cervical spondylosis without myelopathy 07/24/2014  . Clinical depression 04/15/2013  . Acid reflux 04/15/2013  . BP (high blood pressure) 04/15/2013  . Encounter for other preprocedural examination 04/15/2013  . Nasal obstruction 02/12/2013  . Inflamed nasal mucosa 02/12/2013  . Sinus infection 02/12/2013  . Gastric catarrh 03/01/2011  . Allergic rhinitis 10/20/2010  . Airway hyperreactivity 10/20/2010  . Anxiety, generalized 10/20/2010  . Cervical pain 10/20/2010  . Tobacco use disorder 10/20/2010    Past Surgical History:  Procedure Laterality Date  . ANTERIOR CERVICAL DECOMP/DISCECTOMY FUSION  11/13/2011   Procedure: ANTERIOR CERVICAL DECOMPRESSION/DISCECTOMY FUSION 1 LEVEL;  Surgeon: [name], MD;  Location: [place] [facility];  Service: Neurosurgery;  Laterality: N/A;  Anterior Cervical Decompression/discectomy Fusion. Cervical three-four.  Marland Kitchen CARDIAC CATHETERIZATION N/A 01/01/2015  Procedure: Left Heart Cath and Coronary Angiography;  Surgeon: [name], MD;  Location: [place] [facility];  Service: Cardiovascular;  Laterality: N/A;  . CARDIAC CATHETERIZATION N/A 04/05/2016   Procedure: Left Heart Cath and Coronary Angiography;  Surgeon:  Belva Crome, MD;  Location: [place] CV [facility];  Service: Cardiovascular;  Laterality: N/A;  . CARDIAC CATHETERIZATION N/A 04/05/2016   Procedure: Coronary Stent Intervention;  Surgeon: Belva Crome, MD;  Location: [place] CV LAB;  Service: Cardiovascular;  Laterality: N/A;  . CARDIAC CATHETERIZATION N/A 04/05/2016   Procedure: Intravascular Ultrasound/IVUS;  Surgeon: Belva Crome, MD;  Location: [place] [facility];  Service: Cardiovascular;  Laterality: N/A;  . NASAL SINUS SURGERY     2005    Prior to Admission medications   Medication Sig Start Date End Date Taking? Authorizing Provider  aspirin 81 MG EC tablet Take 1 tablet (81 mg total) by mouth daily. 06/02/16   Clovis Fredrickson, MD  atorvastatin (LIPITOR) 80 MG tablet Take 80 mg by mouth every evening.    Historical Provider, MD  baclofen (LIORESAL) 10 MG tablet Take 10 mg by mouth 2 (two) times daily as needed for muscle spasms.    Historical Provider, MD  benztropine (COGENTIN) 1 MG tablet Take 1 mg by mouth daily as needed for tremors.    Historical Provider, MD  carvedilol (COREG) 6.25 MG tablet Take 6.25 mg by mouth 2 (two) times daily. 05/07/16   Historical Provider, MD  fluvoxaMINE (LUVOX) 100 MG tablet Take 1 tablet (100 mg total) by mouth at bedtime. 06/02/16   Clovis Fredrickson, MD  gabapentin (NEURONTIN) 400 MG capsule Take 400 mg by mouth 3 (three) times daily.    Historical Provider, MD  hydrOXYzine (ATARAX/VISTARIL) 25 MG tablet Take 1 tablet (25 mg total) by mouth 3 (three) times daily as needed for anxiety. 06/02/16   Clovis Fredrickson, MD  naltrexone (DEPADE) 50 MG tablet Take 50 mg by mouth daily.    Historical Provider, MD  nitroGLYCERIN (NITROSTAT) 0.4 MG SL tablet Place 0.4 mg under the tongue every 5 (five) minutes x 3 doses as needed. 05/07/16 05/07/17  Historical Provider, MD  QUEtiapine (SEROQUEL) 25 MG tablet Take 1 tablet (25 mg total) by mouth 3 (three) times daily. 06/02/16   Clovis Fredrickson, MD   QUEtiapine (SEROQUEL) 300 MG tablet Take 1 tablet (300 mg total) by mouth at bedtime. 06/02/16   Clovis Fredrickson, MD  ticagrelor (BRILINTA) 90 MG TABS tablet Take 1 tablet (90 mg total) by mouth 2 (two) times daily. 06/02/16   Clovis Fredrickson, MD  traZODone (DESYREL) 50 MG tablet Take 50 mg by mouth at bedtime as needed for sleep.    Historical Provider, MD    Allergies Prednisone; Hydrocodone; and Varenicline  Family History  Problem Relation Age of Onset  . Prostate cancer Father   . Hypertension Mother   . Kidney Stones Mother   . Anxiety disorder Mother   . Depression Mother   . COPD Sister   . Hypertension Sister   . Diabetes Sister   . Depression Sister   . Anxiety disorder Sister   . Seizures Sister     Social History Social History  Substance Use Topics  . Smoking status: Current Every Day Smoker    Packs/day: 2.00    Years: 17.00    Types: Cigarettes, Cigars  . Smokeless tobacco: Former Systems developer     Comment: occassional snuff   . Alcohol  use 0.0 oz/week     Comment: trying not to drink     Review of Systems  Constitutional: Negative for fever. Cardiovascular: Negative for chest pain. Respiratory: Negative for shortness of breath. Gastrointestinal: Negative for abdominal pain, vomiting and diarrhea. Genitourinary: Negative for dysuria. Musculoskeletal: Negative for back pain. Skin: Negative for rash. Neurological: Negative for headaches, focal weakness or numbness. Psychiatry: Positive for SI, anxiety 10-point ROS otherwise negative.  ____________________________________________   PHYSICAL EXAM:  VITAL SIGNS: ED Triage Vitals  Enc Vitals Group     BP 06/25/16 0824 (!) 156/90     Pulse Rate 06/25/16 0824 90     Resp 06/25/16 0824 (!) 24     Temp --      Temp src --      SpO2 06/25/16 0824 98 %     Weight 06/25/16 0824 200 lb (90.7 kg)     Height 06/25/16 0824 6\' 2"  (1.88 m)     Head Circumference --      Peak Flow --      Pain Score  06/25/16 0823 10   Constitutional: Alert and oriented. Appears anxious. Eyes: Conjunctivae are normal. Normal extraocular movements. ENT   Head: Normocephalic and atraumatic.   Nose: No congestion/rhinnorhea.   Mouth/Throat: Mucous membranes are moist.   Neck: No stridor. Hematological/Lymphatic/Immunilogical: No cervical lymphadenopathy. Cardiovascular: Normal rate, regular rhythm.  No murmurs, rubs, or gallops.  Respiratory: Normal respiratory effort without tachypnea nor retractions. Breath sounds are clear and equal bilaterally. No wheezes/rales/rhonchi. Gastrointestinal: Soft and non tender. No rebound. No guarding.  Genitourinary: Deferred Musculoskeletal: Normal range of motion in all extremities. No lower extremity edema. Neurologic:  Normal speech and language. No gross focal neurologic deficits are appreciated.  Skin:  Skin is warm, dry and intact. No rash noted. Psychiatric: Anxious, endorses SI.  ____________________________________________    LABS (pertinent positives/negatives)  None  ____________________________________________   EKG  None  ____________________________________________    RADIOLOGY  None  ____________________________________________   PROCEDURES  Procedures  ____________________________________________   INITIAL IMPRESSION / ASSESSMENT AND PLAN / ED COURSE  Pertinent labs & imaging results that were available during my care of the patient were reviewed by me and considered in my medical decision making (see chart for details).  Patient presents now stating he is suicidal. States he cannot say that earlier because of concerns that his children would not have anybody to look after him. He did present under IVC. Will have patella psych evaluate. At this point do not feel any repeat blood work necessary.  ____________________________________________   FINAL CLINICAL IMPRESSION(S) / ED  DIAGNOSES  Depression Anxiety  Note: This dictation was prepared with Dragon dictation. Any transcriptional errors that result from this process are unintentional     Nance Pear, MD 06/25/16 [phone]

## 2016-06-25 NOTE — ED Notes (Signed)
Pt unable to sign to sign e-pad at this time due to being upset over discharge.

## 2016-06-25 NOTE — ED Notes (Signed)
Patient received PM snack. 

## 2016-06-25 NOTE — ED Notes (Signed)

## 2016-06-25 NOTE — ED Notes (Signed)
Patient asserting that he "wants help" and "nobody understands what it is like for him." Patient was reminded that he has out patient resources available for him but he declines to use them.  Maintained on 15 minute checks and observation by security camera for safety.

## 2016-06-25 NOTE — ED Notes (Signed)
PT IVC/ PENDING PLACEMENT  

## 2016-06-25 NOTE — ED Notes (Signed)
Patient resting quietly at this time, voices no complaints.

## 2016-06-25 NOTE — ED Notes (Signed)
Pt transported to [place] per myself and ODS.  Pt belongings placed in [facility] locker at this time.

## 2016-06-25 NOTE — ED Triage Notes (Signed)
Pt here from home via [name] [employer], reports he's suicidal and has been lying when recently admitted here. Pt anxious in triage, states "am I going to have to act out to get some attention around here?"

## 2016-06-26 DIAGNOSIS — I152 Hypertension secondary to endocrine disorders: Secondary | ICD-10-CM

## 2016-06-26 DIAGNOSIS — F331 Major depressive disorder, recurrent, moderate: Principal | ICD-10-CM

## 2016-06-26 DIAGNOSIS — F1021 Alcohol dependence, in remission: Secondary | ICD-10-CM

## 2016-06-26 DIAGNOSIS — K219 Gastro-esophageal reflux disease without esophagitis: Secondary | ICD-10-CM

## 2016-06-26 DIAGNOSIS — I10 Essential (primary) hypertension: Secondary | ICD-10-CM

## 2016-06-26 MED ORDER — ACETAMINOPHEN 500 MG PO TABS
1000.0000 mg | ORAL_TABLET | Freq: Four times a day (QID) | ORAL | Status: DC | PRN
  Administered 2016-06-27 – 2016-06-29 (×4): 1000 mg via ORAL
  Filled 2016-06-26 (×4): qty 2

## 2016-06-26 MED ORDER — ATORVASTATIN CALCIUM 20 MG PO TABS
80.0000 mg | ORAL_TABLET | Freq: Every evening | ORAL | Status: DC
  Administered 2016-06-26 – 2016-07-02 (×7): 80 mg via ORAL
  Filled 2016-06-26 (×7): qty 4

## 2016-06-26 MED ORDER — TICAGRELOR 90 MG PO TABS
90.0000 mg | ORAL_TABLET | Freq: Two times a day (BID) | ORAL | Status: DC
  Administered 2016-06-26 – 2016-07-03 (×14): 90 mg via ORAL
  Filled 2016-06-26 (×16): qty 1

## 2016-06-26 MED ORDER — CARVEDILOL 6.25 MG PO TABS
6.2500 mg | ORAL_TABLET | Freq: Two times a day (BID) | ORAL | Status: DC
  Administered 2016-06-26 – 2016-07-03 (×14): 6.25 mg via ORAL
  Filled 2016-06-26 (×15): qty 1

## 2016-06-26 MED ORDER — DOXEPIN HCL 25 MG PO CAPS
50.0000 mg | ORAL_CAPSULE | Freq: Every day | ORAL | Status: DC
  Administered 2016-06-26 – 2016-06-27 (×2): 50 mg via ORAL
  Filled 2016-06-26 (×2): qty 2

## 2016-06-26 MED ORDER — ASPIRIN EC 81 MG PO TBEC
81.0000 mg | DELAYED_RELEASE_TABLET | Freq: Every day | ORAL | Status: DC
  Administered 2016-06-26 – 2016-07-03 (×8): 81 mg via ORAL
  Filled 2016-06-26 (×9): qty 1

## 2016-06-26 MED ORDER — CEPHALEXIN 500 MG PO CAPS
500.0000 mg | ORAL_CAPSULE | Freq: Three times a day (TID) | ORAL | Status: DC
  Administered 2016-06-26 – 2016-07-03 (×22): 500 mg via ORAL
  Filled 2016-06-26 (×25): qty 1

## 2016-06-26 MED ORDER — HALOPERIDOL 0.5 MG PO TABS
0.5000 mg | ORAL_TABLET | Freq: Four times a day (QID) | ORAL | Status: DC | PRN
  Administered 2016-06-26: 0.5 mg via ORAL

## 2016-06-26 MED ORDER — HALOPERIDOL 0.5 MG PO TABS
0.5000 mg | ORAL_TABLET | Freq: Three times a day (TID) | ORAL | Status: DC
  Administered 2016-06-26 – 2016-06-30 (×12): 0.5 mg via ORAL
  Filled 2016-06-26 (×13): qty 1

## 2016-06-26 MED ORDER — FLUVOXAMINE MALEATE 50 MG PO TABS
100.0000 mg | ORAL_TABLET | Freq: Two times a day (BID) | ORAL | Status: DC
  Administered 2016-06-26 – 2016-06-28 (×4): 100 mg via ORAL
  Filled 2016-06-26 (×4): qty 2

## 2016-06-26 MED ORDER — IBUPROFEN 600 MG PO TABS
600.0000 mg | ORAL_TABLET | Freq: Four times a day (QID) | ORAL | Status: DC | PRN
  Administered 2016-06-27 – 2016-06-29 (×2): 600 mg via ORAL
  Filled 2016-06-26 (×2): qty 1

## 2016-06-26 MED ORDER — CYCLOBENZAPRINE HCL 10 MG PO TABS
5.0000 mg | ORAL_TABLET | Freq: Three times a day (TID) | ORAL | Status: DC
  Administered 2016-06-26 – 2016-06-29 (×9): 5 mg via ORAL
  Filled 2016-06-26 (×9): qty 1

## 2016-06-26 MED ORDER — NITROGLYCERIN 0.4 MG SL SUBL: 0.4000 mg | SUBLINGUAL_TABLET | SUBLINGUAL | Status: DC | PRN

## 2016-06-26 NOTE — Progress Notes (Signed)
Patient denies SI/HI/AVH.   Affect is anxious and tense.  Verbalizes that he does not know what is wrong with him because he has never felt this way before.  Complains of being cold.  Extra blankets given at his request.  Support and encouragement offered.  Safety maintained.

## 2016-06-26 NOTE — [facility] Suicide Risk Assessment (Signed)
[facility] Admission Suicide Risk Assessment   Nursing information obtained from:  Patient Demographic factors:  Male, Divorced or widowed, Caucasian, Unemployed Current Mental Status:  Suicidal ideation indicated by patient, Self-harm thoughts Loss Factors:  Legal issues Historical Factors:  NA Risk Reduction Factors:  Living with another person, especially a relative, Responsible for children under 44 years of age, Sense of responsibility to family   Principal Problem: Major depressive disorder, recurrent episode, moderate with anxious distress ([place]) Diagnosis:   Patient Active Problem List   Diagnosis Date Noted  . HTN (hypertension) [I10] 06/26/2016  . GERD (gastroesophageal reflux disease) [K21.9] 06/26/2016  . Alcohol use disorder, severe, 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[place]) [F10.21] 06/26/2016  . Phencyclidine (PCP) use disorder, severe, dependence ([place]) [F16.20] 05/28/2016  . Sedative, hypnotic or anxiolytic use disorder, severe, dependence ([place]) [F13.20] 05/12/2016  . Ventricular fibrillation ([place]) [I49.01] 04/05/2016  . 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 History of ST elevation myocardial infarction (STEMI) [I25.2] 01/05/2016  . Major depressive disorder, recurrent episode, moderate with anxious distress ([place]) [F33.1] 06/21/2015  . Cervical spondylosis without myelopathy [M47.812] 07/24/2014  . Tobacco use disorder [F17.200] 10/20/2010  . Asthma [J45.909] 10/20/2010   Subjective Data:   Continued Clinical Symptoms:  Alcohol Use Disorder Identification Test Final Score (AUDIT): 18 The "Alcohol Use Disorders Identification Test", Guidelines for Use in Primary Care, Second Edition.  World Pharmacologist [facility]). Score between 0-7:  no or low risk or alcohol related problems. Score between 8-15:  moderate risk of alcohol related problems. Score between 16-19:  high risk of alcohol related problems. Score 20 or above:  warrants further diagnostic evaluation for alcohol dependence  and treatment.   CLINICAL FACTORS:   Severe Anxiety and/or Agitation Depression:   Comorbid alcohol abuse/dependence Hopelessness Insomnia Alcohol/Substance Abuse/Dependencies Chronic Pain More than one psychiatric diagnosis Previous Psychiatric Diagnoses and Treatments Medical Diagnoses and Treatments/Surgeries    Psychiatric Specialty Exam: Physical Exam  Constitutional: He is oriented to person, place, and time. He appears well-developed and well-nourished.  HENT:  Head: Normocephalic and atraumatic.  Eyes: Conjunctivae and EOM are normal.  Neck: Normal range of motion.  Respiratory: Effort normal.  Musculoskeletal: Normal range of motion.  Neurological: He is alert and oriented to person, place, and time.    Review of Systems  Constitutional: Negative.   HENT: Negative.   Eyes: Negative.   Respiratory: Negative.   Cardiovascular: Negative.   Gastrointestinal: Negative.   Genitourinary: Negative.   Musculoskeletal: Negative.   Skin: Negative.   Neurological: Negative.   Endo/Heme/Allergies: Negative.   Psychiatric/Behavioral: Positive for depression and substance abuse. Negative for hallucinations, memory loss and suicidal ideas. The patient is nervous/anxious and has insomnia.     Blood pressure 130/87, pulse 71, temperature 98.4 F (36.9 C), temperature source Oral, resp. rate 18, height 6\' 2"  (1.88 m), weight 90.7 kg (200 lb), SpO2 99 %.Body mass index is 25.68 kg/m.                                                    Sleep:  Number of Hours: 5.45      COGNITIVE FEATURES THAT CONTRIBUTE TO RISK:  Closed-mindedness    SUICIDE RISK:   Moderate:  Frequent suicidal ideation with limited intensity, and duration, some specificity in terms of plans, no associated intent, good self-control, limited dysphoria/symptomatology, some risk factors  present, and identifiable protective factors, including available and accessible social  support.  PLAN OF CARE: admit to [facility]  I certify that inpatient services furnished can reasonably be expected to improve the patient's condition.   Hildred Priest, MD 06/26/2016, 3:20 PM

## 2016-06-26 NOTE — H&P (Addendum)
Psychiatric Admission Assessment Adult  Patient Identification: Michael Schmitt MRN:  [id number] Date of Evaluation:  06/26/2016 Chief Complaint:  depression Principal Diagnosis: Major depressive disorder, recurrent episode, moderate with anxious distress ([place]) Diagnosis:   Patient Active Problem List   Diagnosis Date Noted  . HTN (hypertension) [I10] 06/26/2016  . GERD (gastroesophageal reflux disease) [K21.9] 06/26/2016  . Alcohol use disorder, severe, 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[place]) [F10.21] 06/26/2016  . Phencyclidine (PCP) use disorder, severe, dependence ([place]) [F16.20] 05/28/2016  . Sedative, hypnotic or anxiolytic use disorder, severe, dependence ([place]) [F13.20] 05/12/2016  . Ventricular fibrillation ([place]) [I49.01] 04/05/2016  . 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 History of ST elevation myocardial infarction (STEMI) [I25.2] 01/05/2016  . Major depressive disorder, recurrent episode, moderate with anxious distress ([place]) [F33.1] 06/21/2015  . Cervical spondylosis without myelopathy [M47.812] 07/24/2014  . Tobacco use disorder [F17.200] 10/20/2010  . Asthma [J45.909] 10/20/2010   History of Present Illness:   Patient is a 46 year old widowed Caucasian male from [place]. Patient has history of major depressive disorder and alcohol use disorder.  Last year the patient was charged with 2 DWIs and also with disorderly conduct. He is awaiting court hearing for those charges this month. He says he has been unable to deal with the anxiety related to possible outcome that he is facing which is going to jail.  Patient was able to stop drinking in February and his last drink was on February 16. Patient has been under psychiatric care for depression and anxiety for many years. He was mainly treated with high doses of clonazepam  Patient came to the emergency department voluntarily on March 30. He was evaluated by a [facility] and discharged from the emergency department. He came  back again on March 31 and was once again discharged from the emergency department. Patient came back to the emergency department on April 1.  All his complaints were worsening depression and anxiety. During his last ER visit he complained about also having suicidal ideation and wanting some help.  Patient was here in our unit from March 2 to March 9. Immediately after discharge on March 10 he came to the emergency department twice. He had a third visit to the emergency department on March 17. On March 18 he went to [facility] and from there he was transferred to [facility]. He was just discharged from there on March 28 (9 day stay).  Patient complains that his anxiety is severe. He feels almost "drunk". Seroquel, Vistaril are not working. He was started on Neurontin and he has not felt any benefit from it. He is afraid of taking it as well because his father and sister were on Neurontin and had suicidality.  Today the patient denies having suicidal thoughts but does feel like life is never going to get better. He denies auditory or visual hallucinations. He complains of having poor concentration, poor appetite poor energy, severe anxiety and severe depression.  As far as substance abuse per chart He had a PCP addiction however he did not report this to me. He smokes heavily. He was drinking about 12 pack a day but quit drinking in February. He tried marijuana years ago denies any recent use.  Urine toxicology only positive for TCAs (likely false positive for seroquel). Alcohol level was below the detection limit.     Associated Signs/Symptoms: Depression Symptoms:  depressed mood, insomnia, psychomotor agitation, hopelessness, anxiety, (Hypo) Manic Symptoms:  Distractibility, Anxiety Symptoms:  Excessive Worry, Psychotic Symptoms:  denies  PTSD Symptoms: NA Total Time spent with patient: 1 hour  Past Psychiatric History: Patient reports that he has had at least 4 hospitalizations in his life,  so basically most of them had happened this year as a result of the charges he received last year.  He has been a patient of Dr. suicidal at [facility] in [place] for more than 14 years. He was treated with high-dose clonazepam. High-dose clonazepam was discontinued several months ago as the patient was also drinking.  Denies any history of suicidal attempts  Is the patient at risk to self? Yes.    Has the patient been a risk to self in the past 6 months? No.  Has the patient been a risk to self within the distant past? No.  Is the patient a risk to others? No.  Has the patient been a risk to others in the past 6 months? No.  Has the patient been a risk to others within the distant past? No.    Alcohol Screening: 1. How often do you have a drink containing alcohol?: Never 2. How many drinks containing alcohol do you have on a typical day when you are drinking?: 1 or 2 3. How often do you have six or more drinks on one occasion?: Never Preliminary Score: 0 4. How often during the last year have you found that you were not able to stop drinking once you had started?: Daily or almost daily 5. How often during the last year have you failed to do what was normally expected from you becasue of drinking?: Daily or almost daily 6. How often during the last year have you needed a first drink in the morning to get yourself going after a heavy drinking session?: Monthly 7. How often during the last year have you had a feeling of guilt of remorse after drinking?: Daily or almost daily 8. How often during the last year have you been unable to remember what happened the night before because you had been drinking?: Never 9. Have you or someone else been injured as a result of your drinking?: No 10. Has a relative or friend or a doctor or another health worker been concerned about your drinking or suggested you cut down?: Yes, during the last year Alcohol Use Disorder Identification Test Final Score (AUDIT):  18 Brief Intervention: Patient declined brief intervention  Past Medical History:  Past Medical History:  Diagnosis Date  . Anxiety   . Anxiety   . Arthritis    cerv. stenosis, spondylosis, HNP- lower back , has been followed in pain clinic, has  had injection s in cerv. area  . Blood dyscrasia    told that when he was young he was a" free bleeder"  . CAD (coronary artery disease)   . Cervical spondylosis without myelopathy 07/24/2014  . Cervicogenic headache 07/24/2014  . Chronic kidney disease    renal calculi- passed spontaneously  . Depression   . Diabetes mellitus without complication ([place])   . Fatty liver   . GERD (gastroesophageal reflux disease)   . Headache(784.0)   . Hyperlipidemia   . Hypertension   . Mental disorder   . MI, old   . RLS (restless legs syndrome)    detected on sleep study  . Shortness of breath   . Ventricular fibrillation ([place]) 04/05/2016    Past Surgical History:  Procedure Laterality Date  . ANTERIOR CERVICAL DECOMP/DISCECTOMY FUSION  11/13/2011   Procedure: ANTERIOR CERVICAL DECOMPRESSION/DISCECTOMY FUSION 1 LEVEL;  Surgeon: Dominica Severin  Levy Sjogren, MD;  Location: [facility] ORS;  Service: Neurosurgery;  Laterality: N/A;  Anterior Cervical Decompression/discectomy Fusion. Cervical three-four.  Marland Kitchen CARDIAC CATHETERIZATION N/A 01/01/2015   Procedure: Left Heart Cath and Coronary Angiography;  Surgeon: [name], MD;  Location: [facility] CV LAB;  Service: Cardiovascular;  Laterality: N/A;  . CARDIAC CATHETERIZATION N/A 04/05/2016   Procedure: Left Heart Cath and Coronary Angiography;  Surgeon: Belva Crome, MD;  Location: [facility];  Service: Cardiovascular;  Laterality: N/A;  . CARDIAC CATHETERIZATION N/A 04/05/2016   Procedure: Coronary Stent Intervention;  Surgeon: Belva Crome, MD;  Location: [facility] LAB;  Service: Cardiovascular;  Laterality: N/A;  . CARDIAC CATHETERIZATION N/A 04/05/2016   Procedure: Intravascular Ultrasound/IVUS;  Surgeon:  Belva Crome, MD;  Location: [facility] LAB;  Service: Cardiovascular;  Laterality: N/A;  . NASAL SINUS SURGERY     2005   Family History:  Family History  Problem Relation Age of Onset  . Prostate cancer Father   . Hypertension Mother   . Kidney Stones Mother   . Anxiety disorder Mother   . Depression Mother   . COPD Sister   . Hypertension Sister   . Diabetes Sister   . Depression Sister   . Anxiety disorder Sister   . Seizures Sister    Family Psychiatric  History: Denies  Tobacco Screening: Have you used any form of tobacco in the last 30 days? (Cigarettes, Smokeless Tobacco, Cigars, and/or Pipes): Yes Tobacco use, Select all that apply: 5 or more cigarettes per day Are you interested in Tobacco Cessation Medications?: Yes, will notify MD for an order Counseled patient on smoking cessation including recognizing danger situations, developing coping skills and basic information about quitting provided: Yes  Social History: Patient has been disabled for years after having an injury in his neck. 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 He lives with his mother, sister and the patient's 2 children who are 77 and 61 years old. The patient's first wife died many years ago years ago. He had a long relationship after that but never got married. He had 2 children with this partner.  History  Alcohol Use  . 0.0 oz/week    Comment: trying not to drink      History  Drug Use No    Additional Social History: Marital status: Widowed Widowed, when?: 80 years Are you sexually active?: No What is your sexual orientation?: heterosexual Does patient have children?: Yes How many children?: 3 How is patient's relationship with their children?: two still in the home, one is an adult and lives outside the home.  Good relationships.     Allergies:   Allergies  Allergen Reactions  . Prednisone Other (See Comments)    Hypertension, makes him feel spacey  . Hydrocodone Other (See  Comments)    Headache, irritable  . Varenicline Other (See Comments)    Suicidal thoughts   Lab Results:  Results for orders placed or performed during the hospital encounter of 06/24/16 (from the past 48 hour(s))  CBC     Status: None   Collection Time: 06/24/16  3:48 PM  Result Value Ref Range   WBC 9.4 3.8 - 10.6 K/uL   RBC 5.05 4.40 - 5.90 MIL/uL   Hemoglobin 15.0 13.0 - 18.0 g/dL   HCT 44.6 40.0 - 52.0 %   MCV 88.4 80.0 - 100.0 fL   MCH 29.7 26.0 - 34.0 pg   MCHC 33.6 32.0 -  36.0 g/dL   RDW 14.4 11.5 - 14.5 %   Platelets 194 150 - 440 K/uL  Comprehensive metabolic panel     Status: Abnormal   Collection Time: 06/24/16  3:48 PM  Result Value Ref Range   Sodium 137 135 - 145 mmol/L   Potassium 4.0 3.5 - 5.1 mmol/L   Chloride 104 101 - 111 mmol/L   CO2 24 22 - 32 mmol/L   Glucose, Bld 121 (H) 65 - 99 mg/dL   BUN 11 6 - 20 mg/dL   Creatinine, Ser 0.82 0.61 - 1.24 mg/dL   Calcium 9.5 8.9 - 10.3 mg/dL   Total Protein 6.9 6.5 - 8.1 g/dL   Albumin 4.1 3.5 - 5.0 g/dL   AST 23 15 - 41 U/L   ALT 36 17 - 63 U/L   Alkaline Phosphatase 115 38 - 126 U/L   Total Bilirubin 0.5 0.3 - 1.2 mg/dL   GFR calc non Af Amer >60 >60 mL/min   GFR calc Af Amer >60 >60 mL/min    Comment: (NOTE) The eGFR has been calculated using the CKD EPI equation. This calculation has not been validated in all clinical situations. eGFR's persistently <60 mL/min signify possible Chronic Kidney Disease.    Anion gap 9 5 - 15  Troponin I     Status: None   Collection Time: 06/24/16  3:48 PM  Result Value Ref Range   Troponin I <0.03 <0.03 ng/mL  Ethanol     Status: None   Collection Time: 06/24/16  3:48 PM  Result Value Ref Range   Alcohol, Ethyl (B) <5 <5 mg/dL    Comment:        LOWEST DETECTABLE LIMIT FOR SERUM ALCOHOL IS 5 mg/dL FOR MEDICAL PURPOSES ONLY     Blood Alcohol level:  Lab Results  Component Value Date   ETH <5 06/24/2016   ETH <5 70/96/2836    Metabolic Disorder Labs:  Lab  Results  Component Value Date   HGBA1C 5.6 05/27/2016   MPG 114 05/27/2016   MPG 123 04/05/2016   Lab Results  Component Value Date   PROLACTIN 6.5 05/27/2016   Lab Results  Component Value Date   CHOL 217 (H) 05/27/2016   TRIG 334 (H) 05/27/2016   HDL 32 (L) 05/27/2016   CHOLHDL 6.8 05/27/2016   VLDL 67 (H) 05/27/2016   LDLCALC 118 (H) 05/27/2016   [place] UNABLE TO REPORT DUE TO LIPEMIC INTERFERENCE 04/05/2016    Current Medications: Current Facility-Administered Medications  Medication Dose Route Frequency Provider Last Rate Last Dose  . acetaminophen (TYLENOL) tablet 1,000 mg  1,000 mg Oral Q6H PRN Hildred Priest, MD      . alum & mag hydroxide-simeth (MAALOX/MYLANTA) 200-200-20 MG/5ML suspension 30 mL  30 mL Oral Q4H PRN Jolanta B Pucilowska, MD      . aspirin EC tablet 81 mg  81 mg Oral Daily Hildred Priest, MD   81 mg at 06/26/16 1215  . atorvastatin (LIPITOR) tablet 80 mg  80 mg Oral QPM Hildred Priest, MD      . carvedilol (COREG) tablet 6.25 mg  6.25 mg Oral BID Hildred Priest, MD      . cephALEXin (KEFLEX) capsule 500 mg  500 mg Oral Q8H Hildred Priest, MD      . cyclobenzaprine (FLEXERIL) tablet 5 mg  5 mg Oral TID Hildred Priest, MD   5 mg at 06/26/16 1228  . doxepin (SINEQUAN) capsule 50 mg  50 mg Oral QHS  Hildred Priest, MD      . fluvoxaMINE (LUVOX) tablet 100 mg  100 mg Oral BID Hildred Priest, MD      . haloperidol (HALDOL) tablet 0.5 mg  0.5 mg Oral TID Hildred Priest, MD   0.5 mg at 06/26/16 1228  . ibuprofen (ADVIL,MOTRIN) tablet 600 mg  600 mg Oral Q6H PRN Hildred Priest, MD      . magnesium hydroxide (MILK OF MAGNESIA) suspension 30 mL  30 mL Oral Daily PRN Jolanta B Pucilowska, MD      . nicotine (NICODERM CQ - dosed in mg/24 hours) patch 21 mg  21 mg Transdermal Q0600 Clovis Fredrickson, MD   21 mg at 06/26/16 0640  . nitroGLYCERIN (NITROSTAT)  SL tablet 0.4 mg  0.4 mg Sublingual Q5 Min x 3 PRN Hildred Priest, MD       PTA Medications: Prescriptions Prior to Admission  Medication Sig Dispense Refill Last Dose  . aspirin 81 MG EC tablet Take 1 tablet (81 mg total) by mouth daily. 30 tablet 1 unknown at unknown  . atorvastatin (LIPITOR) 80 MG tablet Take 80 mg by mouth every evening.   unknown at unknown  . baclofen (LIORESAL) 10 MG tablet Take 10 mg by mouth 2 (two) times daily as needed for muscle spasms.   unknown at unknown  . benztropine (COGENTIN) 1 MG tablet Take 1 mg by mouth daily as needed for tremors.   unknown at unknown  . carvedilol (COREG) 6.25 MG tablet Take 6.25 mg by mouth 2 (two) times daily.   unknown at unknown  . fluvoxaMINE (LUVOX) 100 MG tablet Take 1 tablet (100 mg total) by mouth at bedtime. 30 tablet 1 unknown at unknown  . gabapentin (NEURONTIN) 400 MG capsule Take 400 mg by mouth 3 (three) times daily.   unknown at unknown  . hydrOXYzine (ATARAX/VISTARIL) 25 MG tablet Take 1 tablet (25 mg total) by mouth 3 (three) times daily as needed for anxiety. 90 tablet 1 unknown at unknown  . naltrexone (DEPADE) 50 MG tablet Take 50 mg by mouth daily.   unknown at unknown  . nitroGLYCERIN (NITROSTAT) 0.4 MG SL tablet Place 0.4 mg under the tongue every 5 (five) minutes x 3 doses as needed.   unknown at unknown  . QUEtiapine (SEROQUEL) 25 MG tablet Take 1 tablet (25 mg total) by mouth 3 (three) times daily. 90 tablet 1 unknown at unknown  . QUEtiapine (SEROQUEL) 300 MG tablet Take 1 tablet (300 mg total) by mouth at bedtime. 30 tablet 1 unknown at unknown  . ticagrelor (BRILINTA) 90 MG TABS tablet Take 1 tablet (90 mg total) by mouth 2 (two) times daily. 60 tablet 1 unknown at unknown  . traZODone (DESYREL) 50 MG tablet Take 50 mg by mouth at bedtime as needed for sleep.   unknown at unknown    Musculoskeletal: Strength & Muscle Tone: within normal limits Gait & Station: normal Patient leans:  N/A  Psychiatric Specialty Exam: Physical Exam  Constitutional: He is oriented to person, place, and time. He appears well-developed and well-nourished.  HENT:  Head: Normocephalic and atraumatic.  Eyes: Conjunctivae and EOM are normal.  Neck: Normal range of motion.  Respiratory: Effort normal.  Musculoskeletal: Normal range of motion.  Neurological: He is alert and oriented to person, place, and time.    Review of Systems  Constitutional: Negative.   HENT: Negative.   Eyes: Negative.   Respiratory: Negative.   Cardiovascular: Negative.   Gastrointestinal: Negative.  Genitourinary: Negative.   Musculoskeletal: Positive for back pain and neck pain. Negative for falls, joint pain and myalgias.  Skin: Negative.   Neurological: Positive for tingling and sensory change.  Endo/Heme/Allergies: Negative.   Psychiatric/Behavioral: Positive for depression and substance abuse. Negative for hallucinations, memory loss and suicidal ideas. The patient is nervous/anxious and has insomnia.     Blood pressure 130/87, pulse 71, temperature 98.4 F (36.9 C), temperature source Oral, resp. rate 18, height '6\' 2"'  (1.88 m), weight 90.7 kg (200 lb), SpO2 99 %.Body mass index is 25.68 kg/m.  General Appearance: Well Groomed  Eye Contact:  Good  Speech:  Clear and Coherent  Volume:  Normal  Mood:  Anxious and Dysphoric  Affect:  Appropriate and Congruent  Thought Process:  Linear and Descriptions of Associations: Intact  Orientation:  Full (Time, Place, and Person)  Thought Content:  Hallucinations: None  Suicidal Thoughts:  No  Homicidal Thoughts:  No  Memory:  Immediate;   Fair Recent;   Fair Remote;   Fair  Judgement:  Poor  Insight:  Shallow  Psychomotor Activity:  Increased  Concentration:  Concentration: Poor and Attention Span: Poor  Recall:  [employer] of Knowledge:  Fair  Language:  Good  Akathisia:  No  Handed:    AIMS (if indicated):     Assets:  Armed forces logistics/support/administrative officer Social  Support  ADL's:  Intact  Cognition:  WNL  Sleep:  Number of Hours: 5.45    Treatment Plan Summary: Daily contact with patient to assess and evaluate symptoms and progress in treatment and Medication management  Patient is a 46 year old with depression, chronic anxiety and substance abuse issues. The patient is facing legal charges this month, discharges are related to alcohol dependence. Patient is distressed about the possible outcome of having to go to jail. Patient believes his making jail time by staying in the hospital and seems to think that the judge will not making the stain jail because he has already stated in a lockdown facility   Major depressive disorder the patient has been started on Luvox which has been titrated up from 100-150. Today I will increase it to 100 mg twice a day  For anxiety the patient has failed multiple agents. He says that Seroquel, Neurontin, Vistaril have not helped at all. Will start patient on Haldol 0.5 mg 3 times a day for now .   For insomnia and chronic pain we'll start him on doxepin 50 mg daily at bedtime.  Cellulitis: Ingrown toenail. [place] the patient on Keflex 500 mg every 8 hours  Alcohol use disorder patient has been in remission for about a month. He will benefit from intensive outpatient substance abuse  Chronic pain we'll start him on Flexeril 5 mg 3 times a day along with Tylenol and ibuprofen as needed  Tobacco use disorder I'll start him on nicotine patch 21 mg  HTN: continue coreg 6.5 mg bid  Dyslipidemia: Continue Lipitor 80 mg a day   Cardiovascular disease continue aspirin 81 mg a day and brilinta  Diet heart healthy  VS q shift  Hospitalization status voluntary  Precautions q 15 m  Dispo: back home  F/u: CBC       Physician Treatment Plan for Primary Diagnosis: Major depressive disorder, recurrent episode, moderate with anxious distress ([place]) Long Term Goal(s): Improvement in symptoms so as ready for  discharge  Short Term Goals: Ability to identify changes in lifestyle to reduce recurrence of condition will improve, Ability to demonstrate  self-control will improve, Ability to identify and develop effective coping behaviors will improve and Ability to identify triggers associated with substance abuse/mental health issues will improve  Physician Treatment Plan for Secondary Diagnosis: Principal Problem:   Major depressive disorder, recurrent episode, moderate with anxious distress ([place]) Active Problems:   Cervical spondylosis without myelopathy   Tobacco use disorder   Ventricular fibrillation ([facility])   Sedative, hypnotic or anxiolytic use disorder, severe, dependence ([facility])   HTN (hypertension)   GERD (gastroesophageal reflux disease)   Alcohol use disorder, severe, 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[place])   Asthma   Combined hyperlipidemia   History of ST elevation myocardial infarction (STEMI)  Long Term Goal(s): Improvement in symptoms so as ready for discharge  Short Term Goals: Ability to identify changes in lifestyle to reduce recurrence of condition will improve, Ability to demonstrate self-control will improve, Ability to identify and develop effective coping behaviors will improve, Compliance with prescribed medications will improve and Ability to identify triggers associated with substance abuse/mental health issues will improve  I certify that inpatient services furnished can reasonably be expected to improve the patient's condition.    Hildred Priest, MD 4/2/20183:22 PM

## 2016-06-26 NOTE — Progress Notes (Signed)
Patient arrived on the unit at 2345 from [facility] accompanied by Nurse and Security.  He has had multiple admissions to [facility] since the beginning of March for depression and SI.  Patient is cooperative during assessment.  Fall Risk and Personal Belongings forms were signed.  Skin assessment was done with left big toe having a reddened area in the corner of the nail.  Patient states he may have an ingrown nail.  No contraband was found.  He reports pain in his left lower neck of 10/10 and was given acetaminophen.  He denies SI/HI/AV/H but admits to feeling hopeless and worried.  He focused on physical pain and anxiety rating both at 10/10.  He states he has not had alcohol intake since May 05, 2016, "which I was using to medicate myself".  He smokes up to two packs a day when not in the hospital.  He requests an order for a Nicotine patch. He was observed sleeping by 0045.

## 2016-06-26 NOTE — Progress Notes (Signed)
Recreation Therapy Notes  INPATIENT RECREATION THERAPY ASSESSMENT  Patient Details Name: Michael Schmitt MRN: [id number] DOB: Jul 09, 1970 Today's Date: 06/26/2016  Patient Stressors: Other (Comment) (Pain; has court at the end of the month for two DWIs)  Coping Skills:   Isolate, Avoidance, Exercise, Talking, Music, Other (Comment) (Deep breathing, reading the Bible)  Personal Challenges: Communication, Concentration, Decision-Making, Expressing Yourself, Problem-Solving, Self-Esteem/Confidence, Social Interaction, Stress Management, Trusting Others, Anger, Relationships  Leisure Interests (2+):  Lawyer, Therapist, music - Leisure centre manager Resources:  No  Community Resources:     Current Use:    If no, Barriers?:    Patient Strengths:  "I don't have any"  Patient Identified Areas of Improvement:  Not feel like he does  Current Recreation Participation:  Nothing  Patient Goal for Hospitalization:  To feel better and not be scared when doing every day activities  [place] of Residence:  [place] of Residence:  [place]   Current SI (including self-harm):  Yes  Current HI:  No  Consent to Intern Participation: N/A   Leonette Monarch, LRT/CTRS 06/26/2016, 3:31 PM

## 2016-06-26 NOTE — Tx Team (Signed)
Initial Treatment Plan 06/26/2016 3:02 AM Michael Schmitt NGI:[id number]    PATIENT STRESSORS: Financial difficulties Legal issue Medication change or noncompliance Substance abuse   PATIENT STRENGTHS: Motivation for treatment/growth Supportive family/friends   PATIENT IDENTIFIED PROBLEMS: Pending court issues with DUI  Anxiety/Hx of medication misuse  Chronic pain  Depression with Suicidal Ideation               DISCHARGE CRITERIA:  Ability to meet basic life and health needs Improved stabilization in mood, thinking, and/or behavior Verbal commitment to aftercare and medication compliance  PRELIMINARY DISCHARGE PLAN: Outpatient therapy Return to previous living arrangement  PATIENT/FAMILY INVOLVEMENT: This treatment plan has been presented to and reviewed with the patient, Michael Schmitt.  The Patient verbalizes willingness to comply.  Carl Best, RN 06/26/2016, 3:02 AM

## 2016-06-26 NOTE — Progress Notes (Signed)
Chaplain received an order to visit with pt in room 320. Pt expressed suicidal thoughts and said he did not know if he was going to act on it. Chaplain spoke with nursing staff about a long term facility and was told that he would be discharging in the morning. Pt has been in a long term facility prior to coming to [facility]. Provided ministry of prayer and emotional support.    06/26/16 1728  Clinical Encounter Type  Visited With Patient  Visit Type Initial;Spiritual support  Referral From Nurse  Consult/Referral To Chaplain  Spiritual Encounters  Spiritual Needs Prayer;Emotional
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
## 2016-06-26 NOTE — Plan of Care (Signed)
Problem: Alteration in mood Goal: Knowledge of medications will improve Outcome: Progressing Pt is knowledgeable of his medication regime and remains  compliant

## 2016-06-26 NOTE — BHH Group Notes (Signed)
[place] LCSW Group Therapy Note  Date/Time: 06/26/16, 0930  Type of Therapy and Topic:  Group Therapy:  Overcoming Obstacles  Participation Level:  PT did not attend group.  Description of Group:    In this group patients will be encouraged to explore what they see as obstacles to their own wellness and recovery. They will be guided to discuss their thoughts, feelings, and behaviors related to these obstacles. The group will process together ways to cope with barriers, with attention given to specific choices patients can make. Each patient will be challenged to identify changes they are motivated to make in order to overcome their obstacles. This group will be process-oriented, with patients participating in exploration of their own experiences as well as giving and receiving support and challenge from other group members.  Therapeutic Goals: 1. Patient will identify personal and current obstacles as they relate to admission. 2. Patient will identify barriers that currently interfere with their wellness or overcoming obstacles.  3. Patient will identify feelings, thought process and behaviors related to these barriers. 4. Patient will identify two changes they are willing to make to overcome these obstacles:    Summary of Patient Progress      Therapeutic Modalities:   Cognitive Behavioral Therapy Solution Focused Therapy Motivational Interviewing Relapse Prevention Therapy  Lurline Idol, LCSW

## 2016-06-26 NOTE — Plan of Care (Signed)
Problem: Safety: Goal: Ability to remain free from injury will improve Outcome: Progressing No injuries. Safety maintained

## 2016-06-26 NOTE — Progress Notes (Signed)
Patient has been in bed and did not attend evening activities. Reports that he is not ready for discharge tomorrow "I still .Marland KitchenMarland KitchenMarland Kitchenfeel suicidal, they should not discharge me...Marland KitchenMarland Kitchen need to go to a treatment program...". Was encouraged to discuss his feelings with MD and case management. Safety precautions reinforced.

## 2016-06-26 NOTE — Progress Notes (Signed)
Patient pacing halls.  Presents to the nurses station stating "I need something for anxiety before I loose it"  [name] informed.  Orders received.

## 2016-06-26 NOTE — Progress Notes (Signed)
Recreation Therapy Notes  Date: 04.02.18 Time: 1:00 pm Location: [facility]  Group Topic: Wellness  Goal Area(s) Addresses:  Patient will identify at least one item per dimension of health. Patient will examine areas they are deficient in.  Behavioral Response: Did not attend  Intervention: 6 Dimensions of Health  Activity: Patients were given a definition sheet defining each dimension of health. Patients were given a worksheet with each dimension listed and were instructed to write items they were currently doing in each dimension.  Education: LRT educated patients on ways to improve each dimension.  Education Outcome: Patient did not attend group.  Clinical Observations/Feedback: Patient did not attend group.  Leonette Monarch, LRT/CTRS 06/26/2016 1:41 PM

## 2016-06-26 NOTE — Tx Team (Signed)
Interdisciplinary Treatment and Diagnostic Plan Update  06/26/2016 Time of Session: [place] MRN: [id number]  Principal Diagnosis: Major depressive disorder, recurrent episode, moderate with anxious distress ([place])  Secondary Diagnoses: Principal Problem:   Major depressive disorder, recurrent episode, moderate with anxious distress ([place]) Active Problems:   Cervical spondylosis without myelopathy   Tobacco use disorder   Ventricular fibrillation (HCC)   Sedative, hypnotic or anxiolytic use disorder, severe, dependence (HCC)   HTN (hypertension)   GERD (gastroesophageal reflux disease)   Alcohol use disorder, severe, 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[place])   Asthma   Combined hyperlipidemia   History of ST elevation myocardial infarction (STEMI)   Current Medications:  Current Facility-Administered Medications  Medication Dose Route Frequency Provider Last Rate Last Dose  . acetaminophen (TYLENOL) tablet 1,000 mg  1,000 mg Oral Q6H PRN Hildred Priest, MD      . alum & mag hydroxide-simeth (MAALOX/MYLANTA) 200-200-20 MG/5ML suspension 30 mL  30 mL Oral Q4H PRN Jolanta B Pucilowska, MD      . aspirin EC tablet 81 mg  81 mg Oral Daily Hildred Priest, MD   81 mg at 06/26/16 1215  . atorvastatin (LIPITOR) tablet 80 mg  80 mg Oral QPM Hildred Priest, MD      . carvedilol (COREG) tablet 6.25 mg  6.25 mg Oral BID Hildred Priest, MD      . cephALEXin (KEFLEX) capsule 500 mg  500 mg Oral Q8H Hildred Priest, MD      . cyclobenzaprine (FLEXERIL) tablet 5 mg  5 mg Oral TID Hildred Priest, MD   5 mg at 06/26/16 1228  . doxepin (SINEQUAN) capsule 50 mg  50 mg Oral QHS Hildred Priest, MD      . fluvoxaMINE (LUVOX) tablet 100 mg  100 mg Oral BID Hildred Priest, MD      . haloperidol (HALDOL) tablet 0.5 mg  0.5 mg Oral TID Hildred Priest, MD   0.5 mg at 06/26/16 1228  . ibuprofen (ADVIL,MOTRIN) tablet 600  mg  600 mg Oral Q6H PRN Hildred Priest, MD      . magnesium hydroxide (MILK OF MAGNESIA) suspension 30 mL  30 mL Oral Daily PRN Jolanta B Pucilowska, MD      . nicotine (NICODERM CQ - dosed in mg/24 hours) patch 21 mg  21 mg Transdermal Q0600 Clovis Fredrickson, MD   21 mg at 06/26/16 0640  . nitroGLYCERIN (NITROSTAT) SL tablet 0.4 mg  0.4 mg Sublingual Q5 Min x 3 PRN Hildred Priest, MD       PTA Medications: Prescriptions Prior to Admission  Medication Sig Dispense Refill Last Dose  . aspirin 81 MG EC tablet Take 1 tablet (81 mg total) by mouth daily. 30 tablet 1 unknown at unknown  . atorvastatin (LIPITOR) 80 MG tablet Take 80 mg by mouth every evening.   unknown at unknown  . baclofen (LIORESAL) 10 MG tablet Take 10 mg by mouth 2 (two) times daily as needed for muscle spasms.   unknown at unknown  . benztropine (COGENTIN) 1 MG tablet Take 1 mg by mouth daily as needed for tremors.   unknown at unknown  . carvedilol (COREG) 6.25 MG tablet Take 6.25 mg by mouth 2 (two) times daily.   unknown at unknown  . fluvoxaMINE (LUVOX) 100 MG tablet Take 1 tablet (100 mg total) by mouth at bedtime. 30 tablet 1 unknown at unknown  . gabapentin (NEURONTIN) 400 MG capsule Take 400 mg by mouth 3 (three) times daily.  unknown at unknown  . hydrOXYzine (ATARAX/VISTARIL) 25 MG tablet Take 1 tablet (25 mg total) by mouth 3 (three) times daily as needed for anxiety. 90 tablet 1 unknown at unknown  . naltrexone (DEPADE) 50 MG tablet Take 50 mg by mouth daily.   unknown at unknown  . nitroGLYCERIN (NITROSTAT) 0.4 MG SL tablet Place 0.4 mg under the tongue every 5 (five) minutes x 3 doses as needed.   unknown at unknown  . QUEtiapine (SEROQUEL) 25 MG tablet Take 1 tablet (25 mg total) by mouth 3 (three) times daily. 90 tablet 1 unknown at unknown  . QUEtiapine (SEROQUEL) 300 MG tablet Take 1 tablet (300 mg total) by mouth at bedtime. 30 tablet 1 unknown at unknown  . ticagrelor (BRILINTA) 90  MG TABS tablet Take 1 tablet (90 mg total) by mouth 2 (two) times daily. 60 tablet 1 unknown at unknown  . traZODone (DESYREL) 50 MG tablet Take 50 mg by mouth at bedtime as needed for sleep.   unknown at unknown    Patient Stressors: Financial difficulties Legal issue Medication change or noncompliance Substance abuse  Patient Strengths: Motivation for treatment/growth Supportive family/friends  Treatment Modalities: Medication Management, Group therapy, Case management,  1 to 1 session with clinician, Psychoeducation, Recreational therapy.   Physician Treatment Plan for Primary Diagnosis: Major depressive disorder, recurrent episode, moderate with anxious distress ([place]) Long Term Goal(s):     Short Term Goals:    Medication Management: Evaluate patient's response, side effects, and tolerance of medication regimen.  Therapeutic Interventions: 1 to 1 sessions, Unit Group sessions and Medication administration.  Evaluation of Outcomes: Not Met  Physician Treatment Plan for Secondary Diagnosis: Principal Problem:   Major depressive disorder, recurrent episode, moderate with anxious distress ([place]) Active Problems:   Cervical spondylosis without myelopathy   Tobacco use disorder   Ventricular fibrillation (HCC)   Sedative, hypnotic or anxiolytic use disorder, severe, dependence (HCC)   HTN (hypertension)   GERD (gastroesophageal reflux disease)   Alcohol use disorder, severe, 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[place])   Asthma   Combined hyperlipidemia   History of ST elevation myocardial infarction (STEMI)  Long Term Goal(s):     Short Term Goals:       Medication Management: Evaluate patient's response, side effects, and tolerance of medication regimen.  Therapeutic Interventions: 1 to 1 sessions, Unit Group sessions and Medication administration.  Evaluation of Outcomes: Not Met   RN Treatment Plan for Primary Diagnosis: Major depressive disorder, recurrent episode, moderate with  anxious distress ([place]) Long Term Goal(s): Knowledge of disease and therapeutic regimen to maintain health will improve  Short Term Goals: Ability to demonstrate self-control and Ability to identify and develop effective coping behaviors will improve  Medication Management: RN will administer medications as ordered by provider, will assess and evaluate patient's response and provide education to patient for prescribed medication. RN will report any adverse and/or side effects to prescribing provider.  Therapeutic Interventions: 1 on 1 counseling sessions, Psychoeducation, Medication administration, Evaluate responses to treatment, Monitor vital signs and CBGs as ordered, Perform/monitor CIWA, COWS, AIMS and Fall Risk screenings as ordered, Perform wound care treatments as ordered.  Evaluation of Outcomes: Not Met   LCSW Treatment Plan for Primary Diagnosis: Major depressive disorder, recurrent episode, moderate with anxious distress ([place]) Long Term Goal(s): Safe transition to appropriate next level of care at discharge, Engage patient in therapeutic group addressing interpersonal concerns.  Short Term Goals: Engage patient in aftercare planning with referrals and resources, Increase emotional  regulation and Increase skills for wellness and recovery  Therapeutic Interventions: Assess for all discharge needs, 1 to 1 time with Social worker, Explore available resources and support systems, Assess for adequacy in community support network, Educate family and significant other(s) on suicide prevention, Complete Psychosocial Assessment, Interpersonal group therapy.  Evaluation of Outcomes: Not Met   Progress in Treatment: Attending groups: No. Just arrived. Participating in groups: No. Taking medication as prescribed: Yes. Toleration medication: Yes. Family/Significant other contact made: No, will contact:  sister Patient understands diagnosis: Yes. Discussing patient identified problems/goals  with staff: Yes. Medical problems stabilized or resolved: Yes. Denies suicidal/homicidal ideation: Yes. Issues/concerns per patient self-inventory: No. Other: none  New problem(s) identified: No, Describe:  none  New Short Term/Long Term Goal(s):  Discharge Plan or Barriers:   Reason for Continuation of Hospitalization: Anxiety Depression Medication stabilization  Estimated Length of Stay:2-3 days.  Attendees: Patient: 06/26/2016   Physician: [name], MD 06/26/2016   Nursing: Elige Radon, RN 06/26/2016   RN Care Manager: 06/26/2016  Social Worker: Lurline Idol, LCSW 06/26/2016   Recreational Therapist: Everitt Amber, LRT/CTRS  06/26/2016   Other:  06/26/2016   Other:  06/26/2016   Other: 06/26/2016     Scribe for Treatment Team: Joanne Chars, [place] 06/26/2016 3:34 PM

## 2016-06-27 NOTE — Progress Notes (Signed)
Pt is alert and oriented this evening. Pt mood is depressed and his affect is sad/flat. Pt is c/o 10/10 neck pain although he does not appear to be in any distress. Writer provided PRN medications. Pt reports passive SI but also contracts for safety. He is pleasant and cooperative with staff. 15 minute checks are ongoing for safety.

## 2016-06-27 NOTE — BHH Suicide Risk Assessment (Signed)
[place] INPATIENT:  Family/Significant Other Suicide Prevention Education  Suicide Prevention Education:  Contact Attempts: Keeton Kassebaum, sister, [phone] has been identified by the patient as the family member/significant other with whom the patient will be residing, and identified as the person(s) who will aid the patient in the event of a mental health crisis.  With written consent from the patient, two attempts were made to provide suicide prevention education, prior to and/or following the patient's discharge.  We were unsuccessful in providing suicide prevention education.  A suicide education pamphlet was given to the patient to share with family/significant other.  Date and time of first attempt:06/27/16, 1250 Date and time of second attempt:  Joanne Chars 06/27/2016, 12:50 PM

## 2016-06-27 NOTE — Progress Notes (Signed)
Continues to have passive SI.  States that he does not know what he will do once he leaves.  Continues to have anxiety.  [place] hall. Minimum interaction noted with peers.  Support and encouragement offered.  Safety checks maintained.

## 2016-06-27 NOTE — BHH Suicide Risk Assessment (Signed)
[place] INPATIENT:  Family/Significant Other Suicide Prevention Education  Suicide Prevention Education:  Education Completed;Kearney Evitt, sister, [phone],   has been identified by the patient as the family member/significant other with whom the patient will be residing, and identified as the person(s) who will aid the patient in the event of a mental health crisis (suicidal ideations/suicide attempt).  With written consent from the patient, the family member/significant other has been provided the following suicide prevention education, prior to the and/or following the discharge of the patient.  The suicide prevention education provided includes the following:  Suicide risk factors  Suicide prevention and interventions  National Suicide Hotline telephone number  [facility] assessment telephone number  [facility] Emergency Assistance [place] and/or Residential Mobile Crisis Unit telephone number  Request made of family/significant other to:  Remove weapons (e.g., guns, rifles, knives), all items previously/currently identified as safety concern.  NO guns in the home, per [place].  Remove drugs/medications (over-the-counter, prescriptions, illicit drugs), all items previously/currently identified as a safety concern.  The family member/significant other verbalizes understanding of the suicide prevention education information provided.  The family member/significant other agrees to remove the items of safety concern listed above.  Shanta Mcgeachy also provided the following information:  Pt was taken off klonapan at [place] in February due to alcohol use.  Pt has been much worse since then.  He has not been drinking since mid February, just like he says.  They are worried that he is developing dementia as he does strange things: calls for his daddy (who is deceased), cries a lot, can't remember who certain people are.  He has not been any better  after either of this recent hospitalizations at [facility] or at [facility].    Joanne Chars, LCSW 06/27/2016, 2:33 PM

## 2016-06-27 NOTE — Plan of Care (Signed)
Problem: Alteration in mood Goal: Ability to cope will improve Outcome: Not Progressing  Patient verbalizes that he does not know what he can do to stop feeling this way.  States that he feels safe here but does not feel safe at home.  Wants long term treatment.  Discussed coping skills and explained that he has to take ownership of his anxiety.  Further stated that we can assist him in staying safe while he is here but once discharged he is the only that can ensure that he does not harm himself.

## 2016-06-27 NOTE — BHH Group Notes (Signed)
[place] LCSW Group Therapy Note  Date/Time  Type of Therapy/Topic:  Group Therapy:  Feelings about Diagnosis  Participation Level:  None   Mood: Depressed and Flat    Description of Group:    This group will allow patients to explore their thoughts and feelings about diagnoses they have received. Patients will be guided to explore their level of understanding and acceptance of these diagnoses. Facilitator will encourage patients to process their thoughts and feelings about the reactions of others to their diagnosis, and will guide patients in identifying ways to discuss their diagnosis with significant others in their lives. This group will be process-oriented, with patients participating in exploration of their own experiences as well as giving and receiving support and challenge from other group members.   Therapeutic Goals: 1. Patient will demonstrate understanding of diagnosis as evidence by identifying two or more symptoms of the disorder:  2. Patient will be able to express two feelings regarding the diagnosis 3. Patient will demonstrate ability to communicate their needs through discussion and/or role plays  Summary of Patient Progress: Patient did not stay in group, complained about suicidal thoughts and anxiety.     Therapeutic Modalities:   Cognitive Behavioral Therapy Brief Therapy Feelings Identification   Glorious Peach, MSW, LCSW-A 06/27/2016, 3:28PM

## 2016-06-27 NOTE — Progress Notes (Signed)
[facility] MD Progress Note  06/27/2016 2:27 PM DEV DHONDT  MRN:  [id number] Subjective:  Patient is a 46 year old widowed Caucasian male from [place]. Patient has history of major depressive disorder and alcohol use disorder.  Last year the patient was charged with 2 DWIs and also with disorderly conduct. He is awaiting court hearing for those charges this month. He says he has been unable to deal with the anxiety related to possible outcome that he is facing which is going to jail.  Patient was able to stop drinking in February and his last drink was on February 16. Patient has been under psychiatric care for depression and anxiety for many years. He was mainly treated with high doses of clonazepam  Patient came to the emergency department voluntarily on March 30. He was evaluated by a [facility] and discharged from the emergency department. He came back again on March 31 and was once again discharged from the emergency department. Patient came back to the emergency department on April 1.  All his complaints were worsening depression and anxiety. During his last ER visit he complained about also having suicidal ideation and wanting some help.  Patient was here in our unit from March 2 to March 9. Immediately after discharge on March 10 he came to the emergency department twice. He had a third visit to the emergency department on March 17. On March 18 he went to [facility] and from there he was transferred to [facility]. He was just discharged from there on March 28 (9 day stay).  Patient complains that his anxiety is severe. He feels almost "drunk". Seroquel, Vistaril are not working. He was started on Neurontin and he has not felt any benefit from it. He is afraid of taking it as well because his father and sister were on Neurontin and had suicidality.  06/26/16 the patient denies having suicidal thoughts but does feel like life is never going to get better. He denies auditory or visual  hallucinations. He complains of having poor concentration, poor appetite poor energy, severe anxiety and severe depression.  06/27/16 patient has been to several staff members that he should not be discharged because he still suicidal. He was stating he just did that if he were to be discharged today and that he was going to kill himself. The patient has been pacing around the unit. He appears to be severely anxious and has no ability to cope with stress. Patient says that the medications are not helping. He feels that a lot of the medications he has tried over the years has really help other than clonazepam.  Patient denies any side effects. As far as physical complaints he continues to complain about chronic neck and shoulder pain.  Per nursing: Patient verbalizes that he does not know what he can do to stop feeling this way.  States that he feels safe here but does not feel safe at home.  Wants long term treatment.  Discussed coping skills and explained that he has to take ownership of his anxiety.  Further stated that we can assist him in staying safe while he is here but once discharged he is the only that can ensure that he does not harm himself.   Patient has been in bed and did not attend evening activities. Reports that he is not ready for discharge tomorrow "I still .Marland KitchenMarland KitchenMarland Kitchenfeel suicidal, they should not discharge me...Marland KitchenMarland Kitchen need to go to a treatment program...". Was encouraged to discuss his feelings  with MD and case management. Safety precautions reinforced.   Principal Problem: Major depressive disorder, recurrent episode, moderate with anxious distress ([place]) Diagnosis:   Patient Active Problem List   Diagnosis Date Noted  . HTN (hypertension) [I10] 06/26/2016  . GERD (gastroesophageal reflux disease) [K21.9] 06/26/2016  . Alcohol use disorder, severe, 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[place]) [F10.21] 06/26/2016  . Cellulitis [L03.90] 06/26/2016  . Phencyclidine (PCP) use disorder, severe, dependence ([place])  [F16.20] 05/28/2016  . Sedative, hypnotic or anxiolytic use disorder, severe, dependence ([place]) [F13.20] 05/12/2016  . Ventricular fibrillation ([place]) [I49.01] 04/05/2016  . 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 History of ST elevation myocardial infarction (STEMI) [I25.2] 01/05/2016  . Major depressive disorder, recurrent episode, moderate with anxious distress ([place]) [F33.1] 06/21/2015  . Cervical spondylosis without myelopathy [M47.812] 07/24/2014  . Tobacco use disorder [F17.200] 10/20/2010  . Asthma [J45.909] 10/20/2010   Total Time spent with patient: 30 minutes  Past Psychiatric History: Patient reports that he has had at least 4 hospitalizations in his life, so basically most of them had happened this year as a result of the charges he received last year.  He has been a patient of Dr. suicidal at [facility] in [place] for more than 14 years. He was treated with high-dose clonazepam. High-dose clonazepam was discontinued several months ago as the patient was also drinking.  Denies any history of suicidal attempts  Past Medical History:  Past Medical History:  Diagnosis Date  . Anxiety   . Anxiety   . Arthritis    cerv. stenosis, spondylosis, HNP- lower back , has been followed in pain clinic, has  had injection s in cerv. area  . Blood dyscrasia    told that when he was young he was a" free bleeder"  . CAD (coronary artery disease)   . Cervical spondylosis without myelopathy 07/24/2014  . Cervicogenic headache 07/24/2014  . Chronic kidney disease    renal calculi- passed spontaneously  . Depression   . Diabetes mellitus without complication ([place])   . Fatty liver   . GERD (gastroesophageal reflux disease)   . Headache(784.0)   . Hyperlipidemia   . Hypertension   . Mental disorder   . MI, old   . RLS (restless legs syndrome)    detected on sleep study  . Shortness of breath   . Ventricular fibrillation ([place]) 04/05/2016    Past Surgical History:  Procedure Laterality  Date  . ANTERIOR CERVICAL DECOMP/DISCECTOMY FUSION  11/13/2011   Procedure: ANTERIOR CERVICAL DECOMPRESSION/DISCECTOMY FUSION 1 LEVEL;  Surgeon: [name], MD;  Location: [place] [facility] ORS;  Service: Neurosurgery;  Laterality: N/A;  Anterior Cervical Decompression/discectomy Fusion. Cervical three-four.  Marland Kitchen CARDIAC CATHETERIZATION N/A 01/01/2015   Procedure: Left Heart Cath and Coronary Angiography;  Surgeon: [name], MD;  Location: [place] CV LAB;  Service: Cardiovascular;  Laterality: N/A;  . CARDIAC CATHETERIZATION N/A 04/05/2016   Procedure: Left Heart Cath and Coronary Angiography;  Surgeon: Belva Crome, MD;  Location: [place] CV LAB;  Service: Cardiovascular;  Laterality: N/A;  . CARDIAC CATHETERIZATION N/A 04/05/2016   Procedure: Coronary Stent Intervention;  Surgeon: Belva Crome, MD;  Location: [place] CV LAB;  Service: Cardiovascular;  Laterality: N/A;  . CARDIAC CATHETERIZATION N/A 04/05/2016   Procedure: Intravascular Ultrasound/IVUS;  Surgeon: Belva Crome, MD;  Location: [place] CV LAB;  Service: Cardiovascular;  Laterality: N/A;  . NASAL SINUS SURGERY     2005   Family History:  Family History  Problem Relation Age  of Onset  . Prostate cancer Father   . Hypertension Mother   . Kidney Stones Mother   . Anxiety disorder Mother   . Depression Mother   . COPD Sister   . Hypertension Sister   . Diabetes Sister   . Depression Sister   . Anxiety disorder Sister   . Seizures Sister    Family Psychiatric  History: Denies  Social History: Patient has been disabled for years after having an injury in his neck. 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 He lives with his mother, sister and the patient's 2 children who are 94 and 50 years old. The patient's first wife died many years ago years ago. He had a long relationship after that but never got married. He had 2 children with this partner.  History  Alcohol Use  . 0.0 oz/week    Comment: trying not to  drink      History  Drug Use No    Social History   Social History  . Marital status: Widowed    Spouse name: N/A  . Number of children: 3  . Years of education: 12   Occupational History  .      unemployed   Social History Main Topics  . Smoking status: Current Every Day Smoker    Packs/day: 2.00    Years: 17.00    Types: Cigarettes, Cigars  . Smokeless tobacco: Former Systems developer     Comment: occassional snuff   . Alcohol use 0.0 oz/week     Comment: trying not to drink   . Drug use: No  . Sexual activity: Not Currently   Other Topics Concern  . None   Social History Narrative   Patient is right handed.   Patient drinks 2 sodas daily.     Current Medications: Current Facility-Administered Medications  Medication Dose Route Frequency Provider Last Rate Last Dose  . acetaminophen (TYLENOL) tablet 1,000 mg  1,000 mg Oral Q6H PRN Hildred Priest, MD      . alum & mag hydroxide-simeth (MAALOX/MYLANTA) 200-200-20 MG/5ML suspension 30 mL  30 mL Oral Q4H PRN Jolanta B Pucilowska, MD      . aspirin EC tablet 81 mg  81 mg Oral Daily Hildred Priest, MD   81 mg at 06/27/16 0854  . atorvastatin (LIPITOR) tablet 80 mg  80 mg Oral QPM Hildred Priest, MD   80 mg at 06/26/16 1640  . carvedilol (COREG) tablet 6.25 mg  6.25 mg Oral BID Hildred Priest, MD   6.25 mg at 06/27/16 0854  . cephALEXin (KEFLEX) capsule 500 mg  500 mg Oral Q8H Hildred Priest, MD   500 mg at 06/27/16 1322  . cyclobenzaprine (FLEXERIL) tablet 5 mg  5 mg Oral TID Hildred Priest, MD   5 mg at 06/27/16 1323  . doxepin (SINEQUAN) capsule 50 mg  50 mg Oral QHS Hildred Priest, MD   50 mg at 06/26/16 2100  . fluvoxaMINE (LUVOX) tablet 100 mg  100 mg Oral BID Hildred Priest, MD   100 mg at 06/27/16 0854  . haloperidol (HALDOL) tablet 0.5 mg  0.5 mg Oral TID Hildred Priest, MD   0.5 mg at 06/27/16 1323  . ibuprofen (ADVIL,MOTRIN)  tablet 600 mg  600 mg Oral Q6H PRN Hildred Priest, MD      . magnesium hydroxide (MILK OF MAGNESIA) suspension 30 mL  30 mL Oral Daily PRN Jolanta B Pucilowska, MD      . nicotine (NICODERM CQ - dosed  in mg/24 hours) patch 21 mg  21 mg Transdermal Q0600 Clovis Fredrickson, MD   21 mg at 06/27/16 0855  . nitroGLYCERIN (NITROSTAT) SL tablet 0.4 mg  0.4 mg Sublingual Q5 Min x 3 PRN Hildred Priest, MD      . ticagrelor [facility]) tablet 90 mg  90 mg Oral BID Hildred Priest, MD   90 mg at 06/27/16 1660    Lab Results: No results found for this or any previous visit (from the past 48 hour(s)).  Blood Alcohol level:  Lab Results  Component Value Date   ETH <5 06/24/2016   ETH <5 63/03/6008    Metabolic Disorder Labs: Lab Results  Component Value Date   HGBA1C 5.6 05/27/2016   MPG 114 05/27/2016   MPG 123 04/05/2016   Lab Results  Component Value Date   PROLACTIN 6.5 05/27/2016   Lab Results  Component Value Date   CHOL 217 (H) 05/27/2016   TRIG 334 (H) 05/27/2016   HDL 32 (L) 05/27/2016   CHOLHDL 6.8 05/27/2016   VLDL 67 (H) 05/27/2016   LDLCALC 118 (H) 05/27/2016   LDLCALC UNABLE TO REPORT DUE TO LIPEMIC INTERFERENCE 04/05/2016    Physical Findings: AIMS:  , ,  ,  ,    CIWA:    COWS:     Musculoskeletal: Strength & Muscle Tone: within normal limits Gait & Station: normal Patient leans: N/A  Psychiatric Specialty Exam: Physical Exam  Constitutional: He is oriented to person, place, and time. He appears well-developed and well-nourished.  HENT:  Head: Normocephalic and atraumatic.  Eyes: EOM are normal.  Respiratory: Effort normal.  Neurological: He is alert and oriented to person, place, and time.    Review of Systems  Constitutional: Negative.   HENT: Negative.   Eyes: Negative.   Respiratory: Negative.   Cardiovascular: Negative.   Gastrointestinal: Negative.   Genitourinary: Negative.   Musculoskeletal: Positive for joint  pain and neck pain.  Skin: Negative.   Neurological: Negative.   Endo/Heme/Allergies: Negative.   Psychiatric/Behavioral: Positive for depression, substance abuse and suicidal ideas. Negative for hallucinations and memory loss. The patient is nervous/anxious and has insomnia.     Blood pressure (!) 138/92, pulse 81, temperature 98 F (36.7 C), resp. rate 18, height '6\' 2"'  (1.88 m), weight 90.7 kg (200 lb), SpO2 99 %.Body mass index is 25.68 kg/m.  General Appearance: Well Groomed  Eye Contact:  Good  Speech:  Clear and Coherent  Volume:  Normal  Mood:  Anxious and Dysphoric  Affect:  Congruent  Thought Process:  Linear and Descriptions of Associations: Intact  Orientation:  Full (Time, Place, and Person)  Thought Content:  Hallucinations: None  Suicidal Thoughts:  Yes.  without intent/plan  Homicidal Thoughts:  No  Memory:  Immediate;   Fair Recent;   Fair Remote;   Fair  Judgement:  Poor  Insight:  Shallow  Psychomotor Activity:  Increased  Concentration:  Concentration: Poor and Attention Span: Poor  Recall:  [employer] of Knowledge:  Fair  Language:  Fair  Akathisia:  No  Handed:    AIMS (if indicated):     Assets:  Agricultural consultant Housing Social Support  ADL's:  Intact  Cognition:  WNL  Sleep:  Number of Hours: 8     Treatment Plan Summary: Daily contact with patient to assess and evaluate symptoms and progress in treatment and Medication management   Patient is a 46 year old with depression, chronic anxiety and substance abuse  issues. The patient is facing legal charges this month, discharges are related to alcohol dependence. Patient is distressed about the possible outcome of having to go to jail. Patient believes his making jail time by staying in the hospital and seems to think that the judge will not making the stain jail because he has already stated in a lockdown facility   Major depressive disorder the patient has been  started on Luvox which has been titrated up from 100 to 150. Here luvox has been increased to 100 mg twice a day.  Patient has been med seeking. He has been pacing the floors. He has not been attending groups and when he does he does not stay all throughout the group. Social worker and myself met with the patient today we encouraged the patient to participate in groups.  For anxiety the patient has failed multiple agents. He says that Seroquel, Neurontin, Vistaril have not helped at all. Started Haldol 0.5 mg 3 times a day for now .   For insomnia and chronic pain: continue doxepin 50 mg po qhs  Cellulitis: Ingrown toenail. Continue  Keflex 500 mg every 8 hours until Tuesday 10  Alcohol use disorder patient has been in remission for about a month. He will benefit from intensive outpatient substance abuse  Chronic pain we'll start him on Flexeril 5 mg 3 times a day along with Tylenol and ibuprofen as needed  Tobacco use disorder: continue  nicotine patch 21 mg  HTN: continue coreg 6.5 mg bid  Dyslipidemia: Continue Lipitor 80 mg a day   Cardiovascular disease continue aspirin 81 mg a day and brilinta  Diet heart healthy  VS q shift  Hospitalization status voluntary  Precautions q 15 m  Dispo: back home  F/u: CBC  Hildred Priest, MD 06/27/2016, 2:27 PM

## 2016-06-27 NOTE — Plan of Care (Signed)
Problem: [facility] Participation in Recreation Therapeutic Interventions Goal: STG-Patient will demonstrate improved self esteem by identif STG: Self-Esteem - Within 4 treatment sessions, patient will verbalize at least 5 positive affirmation statements in each of 2 treatment sessions to increase self-esteem.  Outcome: Progressing Treatment Session 1; Completed 1 out of 1: At approximately 10:10 am, LRT met with patient in patient room. Patient verbalized 5 positive affirmation statements. Patient reported it felt "like shit". LRT encouraged patient to continue saying positive affirmation statements. Patient reported he did not want to feel like he did, but did not know how to get better.  Leonette Monarch, LRT/CTRS 04.03.18 1:30 pm Goal: STG-Other Recreation Therapy Goal (Specify) STG: Stress Management - Within 4 treatment sessions, patient will verbalize understanding of the stress management techniques in each of 2 treatment sessions to increase stress management skills.  Outcome: Progressing Treatment Session 1; Completed 1 out of 2: At approximately 10:10 am, LRT met with patient in patient room. LRT educated and provided patient with handouts on the stress management techniques. Patient verbalized understanding. LRT encouraged patient to read over and practice the stress management techniques.  Leonette Monarch, LRT/CTRS  04.03.18 1:32 pm

## 2016-06-27 NOTE — Progress Notes (Signed)
Recreation Therapy Notes  Date: 04.03.18 Time: 3:00 pm Location: Craft Room  Group Topic: Self-expression  Goal Area(s) Addresses:  Patient will effectively use art as a means of self-expression. Patient will recognize positive benefit of self-expression. Patient will verbalize one positive emotion experienced while participating in activity.  Behavioral Response: Attentive, Interactive  Intervention: Two Faces of Me  Activity: Patients were given blank face worksheets and were instructed to draw a line down the middle. On one side, they were instructed to draw or write how they felt when they were admitted and on the other side they were instructed to draw or write how they want to feel when they are d/c.  Education: LRT educated patients on other forms of self-expression.  Education Outcome: Acknowledges education/In group clarification offered  Clinical Observations/Feedback: Patient drew and wrote how he felt when he was admitted and how he wants to feel when he is d/c. Patient contributed to group discussion by stating how the sides were different, what he can do to help maintain feeling good, what makes art a good form of self-expression, and what happens when he bottles up his emotions.  Leonette Monarch, LRT/CTRS 06/27/2016 3:37 PM

## 2016-06-28 MED ORDER — DOXEPIN HCL 25 MG PO CAPS
75.0000 mg | ORAL_CAPSULE | Freq: Every day | ORAL | Status: DC
  Administered 2016-06-28: 75 mg via ORAL
  Filled 2016-06-28 (×2): qty 3

## 2016-06-28 MED ORDER — FLUVOXAMINE MALEATE 50 MG PO TABS
50.0000 mg | ORAL_TABLET | Freq: Two times a day (BID) | ORAL | Status: DC
  Administered 2016-06-28 – 2016-06-29 (×2): 50 mg via ORAL
  Filled 2016-06-28 (×2): qty 1

## 2016-06-28 NOTE — [facility] Group Notes (Signed)
[facility] Group Notes:  (Nursing/MHT/Case Management/Adjunct)  Date:  06/28/2016  Time:  5:30 PM  Type of Therapy:  Psychoeducational Skills  Participation Level:  Active  Participation Quality:  Appropriate  Affect:  Appropriate  Cognitive:  Appropriate  Insight:  Appropriate  Engagement in Group:  Engaged  Modes of Intervention:  Socialization  Summary of Progress/Problems:  Michael Schmitt [name] 06/28/2016, 5:30 PM

## 2016-06-28 NOTE — Progress Notes (Signed)
Recreation Therapy Notes  Date: 04.04.18 Time: 1:00 pm Location: Craft Room  Group Topic: Self-esteem  Goal Area(s) Addresses:  Patients will write at least one positive trait about themselves. Patients will verbalize benefit of having a healthy self-esteem.  Behavioral Response: Attentive  Intervention: I Am  Activity: Patients were given a worksheet with the letter I on it and were instructed to write as many positive traits about themselves inside the letter.  Education: LRT educated patients on ways they can improve their self-esteem.  Education Outcome: Acknowledges education/In group clarification offered  Clinical Observations/Feedback: Patient wrote positive traits about self. Patient did not contribute to group discussion.  Leonette Monarch, LRT/CTRS 06/28/2016 1:58 PM

## 2016-06-28 NOTE — Progress Notes (Signed)
[facility] MD Progress Note  06/28/2016 12:58 PM Michael Schmitt  MRN:  [id number] Subjective:  Patient is a 46 year old widowed Caucasian male from [place]. Patient has history of major depressive disorder and alcohol use disorder.  Last year the patient was charged with 2 DWIs and also with disorderly conduct. He is awaiting court hearing for those charges this month. He says he has been unable to deal with the anxiety related to possible outcome that he is facing which is going to jail.  Patient was able to stop drinking in February and his last drink was on February 16. Patient has been under psychiatric care for depression and anxiety for many years. He was mainly treated with high doses of clonazepam  Patient came to the emergency department voluntarily on March 30. He was evaluated by a [facility] and discharged from the emergency department. He came back again on March 31 and was once again discharged from the emergency department. Patient came back to the emergency department on April 1.  All his complaints were worsening depression and anxiety. During his last ER visit he complained about also having suicidal ideation and wanting some help.  Patient was here in our unit from March 2 to March 9. Immediately after discharge on March 10 he came to the emergency department twice. He had a third visit to the emergency department on March 17. On March 18 he went to [facility] and from there he was transferred to [facility]. He was just discharged from there on March 28 (9 day stay).  Patient complains that his anxiety is severe. He feels almost "drunk". Seroquel, Vistaril are not working. He was started on Neurontin and he has not felt any benefit from it. He is afraid of taking it as well because his father and sister were on Neurontin and had suicidality.  06/26/16 the patient denies having suicidal thoughts but does feel like life is never going to get better. He denies auditory or visual  hallucinations. He complains of having poor concentration, poor appetite poor energy, severe anxiety and severe depression.  06/27/16 patient has been to several staff members that he should not be discharged because he still suicidal. He was stating he just did that if he were to be discharged today and that he was going to kill himself. The patient has been pacing around the unit. He appears to be severely anxious and has no ability to cope with stress. Patient says that the medications are not helping. He feels that a lot of the medications he has tried over the years has really help other than clonazepam.  Patient denies any side effects. As far as physical complaints he continues to complain about chronic neck and shoulder pain.  06/28/16 patient reports sleeping a little better last night however he continued to have multiple awakenings throughout the night. He has been able to attend groups and feels that he is benefited from the material his learning. He is still having a very difficult time relaxing. He is pacing the hallways and hyperventilating all day. He does not feel the medications have been of much benefit. Continues to have passive suicidal ideation. Denies homicidal ideation or auditory or visual hallucinations. Denies side effects from medications or major physical complaints other than chronic pain.  Per nursing: Pt is alert and oriented this evening. Pt mood is depressed and his affect is sad/flat. Pt is c/o 10/10 neck pain although he does not appear to be in any  distress. Writer provided PRN medications. Pt reports passive SI but also contracts for safety. He is pleasant and cooperative with staff. 15 minute checks are ongoing for safety.   Pt is alert and oriented this evening. Pt mood is depressed and his affect is sad/flat. Pt is c/o 10/10 neck pain although he does not appear to be in any distress. Writer provided PRN medications. Pt reports passive SI but also contracts for safety.  He is pleasant and cooperative with staff. 15 minute checks are ongoing for safety.   Principal Problem: Major depressive disorder, recurrent episode, moderate with anxious distress ([place]) Diagnosis:   Patient Active Problem List   Diagnosis Date Noted  . HTN (hypertension) [I10] 06/26/2016  . GERD (gastroesophageal reflux disease) [K21.9] 06/26/2016  . Alcohol use disorder, severe, 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[place]) [F10.21] 06/26/2016  . Cellulitis [L03.90] 06/26/2016  . Phencyclidine (PCP) use disorder, severe, dependence ([place]) [F16.20] 05/28/2016  . Sedative, hypnotic or anxiolytic use disorder, severe, dependence ([place]) [F13.20] 05/12/2016  . Ventricular fibrillation ([place]) [I49.01] 04/05/2016  . 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 History of ST elevation myocardial infarction (STEMI) [I25.2] 01/05/2016  . Major depressive disorder, recurrent episode, moderate with anxious distress ([place]) [F33.1] 06/21/2015  . Cervical spondylosis without myelopathy [M47.812] 07/24/2014  . Tobacco use disorder [F17.200] 10/20/2010  . Asthma [J45.909] 10/20/2010   Total Time spent with patient: 30 minutes  Past Psychiatric History: Patient reports that he has had at least 4 hospitalizations in his life, so basically most of them had happened this year as a result of the charges he received last year.  He has been a patient of Dr. suicidal at [facility] in [place] for more than 14 years. He was treated with high-dose clonazepam. High-dose clonazepam was discontinued several months ago as the patient was also drinking.  Denies any history of suicidal attempts  Past Medical History:  Past Medical History:  Diagnosis Date  . Anxiety   . Anxiety   . Arthritis    cerv. stenosis, spondylosis, HNP- lower back , has been followed in pain clinic, has  had injection s in cerv. area  . Blood dyscrasia    told that when he was young he was a" free bleeder"  . CAD (coronary artery disease)   . Cervical  spondylosis without myelopathy 07/24/2014  . Cervicogenic headache 07/24/2014  . Chronic kidney disease    renal calculi- passed spontaneously  . Depression   . Diabetes mellitus without complication ([place])   . Fatty liver   . GERD (gastroesophageal reflux disease)   . Headache(784.0)   . Hyperlipidemia   . Hypertension   . Mental disorder   . MI, old   . RLS (restless legs syndrome)    detected on sleep study  . Shortness of breath   . Ventricular fibrillation ([place]) 04/05/2016    Past Surgical History:  Procedure Laterality Date  . ANTERIOR CERVICAL DECOMP/DISCECTOMY FUSION  11/13/2011   Procedure: ANTERIOR CERVICAL DECOMPRESSION/DISCECTOMY FUSION 1 LEVEL;  Surgeon: [name], MD;  Location: [facility];  Service: Neurosurgery;  Laterality: N/A;  Anterior Cervical Decompression/discectomy Fusion. Cervical three-four.  Marland Kitchen CARDIAC CATHETERIZATION N/A 01/01/2015   Procedure: Left Heart Cath and Coronary Angiography;  Surgeon: [name], MD;  Location: [facility];  Service: Cardiovascular;  Laterality: N/A;  . CARDIAC CATHETERIZATION N/A 04/05/2016   Procedure: Left Heart Cath and Coronary Angiography;  Surgeon: Belva Crome, MD;  Location: [place] [facility];  Service: Cardiovascular;  Laterality:  N/A;  . CARDIAC CATHETERIZATION N/A 04/05/2016   Procedure: Coronary Stent Intervention;  Surgeon: Belva Crome, MD;  Location: [place] CV LAB;  Service: Cardiovascular;  Laterality: N/A;  . CARDIAC CATHETERIZATION N/A 04/05/2016   Procedure: Intravascular Ultrasound/IVUS;  Surgeon: Belva Crome, MD;  Location: [place];  Service: Cardiovascular;  Laterality: N/A;  . NASAL SINUS SURGERY     2005   Family History:  Family History  Problem Relation Age of Onset  . Prostate cancer Father   . Hypertension Mother   . Kidney Stones Mother   . Anxiety disorder Mother   . Depression Mother   . COPD Sister   . Hypertension Sister   . Diabetes Sister   . Depression  Sister   . Anxiety disorder Sister   . Seizures Sister    Family Psychiatric  History: Denies  Social History: Patient has been disabled for years after having an injury in his neck. 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 He lives with his mother, sister and the patient's 2 children who are 14 and 43 years old. The patient's first wife died many years ago years ago. He had a long relationship after that but never got married. He had 2 children with this partner.  History  Alcohol Use  . 0.0 oz/week    Comment: trying not to drink      History  Drug Use No    Social History   Social History  . Marital status: Widowed    Spouse name: N/A  . Number of children: 3  . Years of education: 12   Occupational History  .      unemployed   Social History Main Topics  . Smoking status: Current Every Day Smoker    Packs/day: 2.00    Years: 17.00    Types: Cigarettes, Cigars  . Smokeless tobacco: Former Systems developer     Comment: occassional snuff   . Alcohol use 0.0 oz/week     Comment: trying not to drink   . Drug use: No  . Sexual activity: Not Currently   Other Topics Concern  . None   Social History Narrative   Patient is right handed.   Patient drinks 2 sodas daily.     Current Medications: Current Facility-Administered Medications  Medication Dose Route Frequency Provider Last Rate Last Dose  . acetaminophen (TYLENOL) tablet 1,000 mg  1,000 mg Oral Q6H PRN Hildred Priest, MD   1,000 mg at 06/28/16 1158  . alum & mag hydroxide-simeth (MAALOX/MYLANTA) 200-200-20 MG/5ML suspension 30 mL  30 mL Oral Q4H PRN Jolanta B Pucilowska, MD      . aspirin EC tablet 81 mg  81 mg Oral Daily Hildred Priest, MD   81 mg at 06/28/16 0811  . atorvastatin (LIPITOR) tablet 80 mg  80 mg Oral QPM Hildred Priest, MD   80 mg at 06/27/16 1801  . carvedilol (COREG) tablet 6.25 mg  6.25 mg Oral BID Hildred Priest, MD   6.25 mg at 06/28/16 0811  .  cephALEXin (KEFLEX) capsule 500 mg  500 mg Oral Q8H Hildred Priest, MD   500 mg at 06/28/16 1246  . cyclobenzaprine (FLEXERIL) tablet 5 mg  5 mg Oral TID Hildred Priest, MD   5 mg at 06/28/16 1158  . doxepin (SINEQUAN) capsule 75 mg  75 mg Oral QHS Hildred Priest, MD      . fluvoxaMINE (LUVOX) tablet 50 mg  50 mg Oral BID Hildred Priest,  MD      . haloperidol (HALDOL) tablet 0.5 mg  0.5 mg Oral TID Hildred Priest, MD   0.5 mg at 06/28/16 1157  . ibuprofen (ADVIL,MOTRIN) tablet 600 mg  600 mg Oral Q6H PRN Hildred Priest, MD   600 mg at 06/27/16 2124  . magnesium hydroxide (MILK OF MAGNESIA) suspension 30 mL  30 mL Oral Daily PRN Jolanta B Pucilowska, MD      . nicotine (NICODERM CQ - dosed in mg/24 hours) patch 21 mg  21 mg Transdermal Q0600 Jolanta B Pucilowska, MD   21 mg at 06/28/16 0522  . nitroGLYCERIN (NITROSTAT) SL tablet 0.4 mg  0.4 mg Sublingual Q5 Min x 3 PRN Hildred Priest, MD      . ticagrelor (BRILINTA) tablet 90 mg  90 mg Oral BID Hildred Priest, MD   90 mg at 06/28/16 8250    Lab Results: No results found for this or any previous visit (from the past 48 hour(s)).  Blood Alcohol level:  Lab Results  Component Value Date   ETH <5 06/24/2016   ETH <5 53/97/6734    Metabolic Disorder Labs: Lab Results  Component Value Date   HGBA1C 5.6 05/27/2016   MPG 114 05/27/2016   MPG 123 04/05/2016   Lab Results  Component Value Date   PROLACTIN 6.5 05/27/2016   Lab Results  Component Value Date   CHOL 217 (H) 05/27/2016   TRIG 334 (H) 05/27/2016   HDL 32 (L) 05/27/2016   CHOLHDL 6.8 05/27/2016   VLDL 67 (H) 05/27/2016   LDLCALC 118 (H) 05/27/2016   LDLCALC UNABLE TO REPORT DUE TO LIPEMIC INTERFERENCE 04/05/2016    Physical Findings: AIMS:  , ,  ,  ,    CIWA:    COWS:     Musculoskeletal: Strength & Muscle Tone: within normal limits Gait & Station: normal Patient leans:  N/A  Psychiatric Specialty Exam: Physical Exam  Constitutional: He is oriented to person, place, and time. He appears well-developed and well-nourished.  HENT:  Head: Normocephalic and atraumatic.  Eyes: EOM are normal.  Respiratory: Effort normal.  Neurological: He is alert and oriented to person, place, and time.    Review of Systems  Constitutional: Negative.   HENT: Negative.   Eyes: Negative.   Respiratory: Negative.   Cardiovascular: Negative.   Gastrointestinal: Negative.   Genitourinary: Negative.   Musculoskeletal: Positive for joint pain and neck pain.  Skin: Negative.   Neurological: Negative.   Endo/Heme/Allergies: Negative.   Psychiatric/Behavioral: Positive for depression, substance abuse and suicidal ideas. Negative for hallucinations and memory loss. The patient is nervous/anxious and has insomnia.     Blood pressure (!) 138/92, pulse 81, temperature 98 F (36.7 C), resp. rate 18, height 6\' 2"  (1.88 m), weight 90.7 kg (200 lb), SpO2 99 %.Body mass index is 25.68 kg/m.  General Appearance: Well Groomed  Eye Contact:  Good  Speech:  Clear and Coherent  Volume:  Normal  Mood:  Anxious and Dysphoric  Affect:  Congruent  Thought Process:  Linear and Descriptions of Associations: Intact  Orientation:  Full (Time, Place, and Person)  Thought Content:  Hallucinations: None  Suicidal Thoughts:  Yes.  without intent/plan  Homicidal Thoughts:  No  Memory:  Immediate;   Fair Recent;   Fair Remote;   Fair  Judgement:  Poor  Insight:  Shallow  Psychomotor Activity:  Increased  Concentration:  Concentration: Poor and Attention Span: Poor  Recall:  [employer] of Knowledge:  Fair  Language:  Fair  Akathisia:  No  Handed:    AIMS (if indicated):     Assets:  Agricultural consultant Housing Social Support  ADL's:  Intact  Cognition:  WNL  Sleep:  Number of Hours: 7.75     Treatment Plan Summary: Daily contact with patient to  assess and evaluate symptoms and progress in treatment and Medication management   Patient is a 46 year old with depression, chronic anxiety and substance abuse issues. The patient is facing legal charges this month, discharges are related to alcohol dependence. Patient is distressed about the possible outcome of having to go to jail. Patient believes his making jail time by staying in the hospital and seems to think that the judge will not making the stain jail because he has already stated in a lockdown facility   Major depressive disorder: Patient is still very anxious, affect is blunted, pacing the hallways.  The plan today will be to titrate Doxepin and use it to target insomnia, depression and chronic pain. I we will start titrating down Luvox; dose will be reduced today to 50 mg twice a day.  Patient likes doxepin and says that he had tried in the past with good response.  For anxiety the patient has failed multiple agents. He says that Seroquel, Neurontin, Vistaril have not helped at all. Started Haldol 0.5 mg 3 times a day for now .   For insomnia and chronic pain: continue doxepin but we will increase to 75 mg  Cellulitis: Ingrown toenail. Continue  Keflex 500 mg every 8 hours until Tuesday 10  Alcohol use disorder patient has been in remission for about a month. He will benefit from intensive outpatient substance abuse  Chronic pain we'll start him on Flexeril 5 mg 3 times a day along with Tylenol and ibuprofen as needed  Tobacco use disorder: continue  nicotine patch 21 mg  HTN: continue coreg 6.5 mg bid  Dyslipidemia: Continue Lipitor 80 mg a day   Cardiovascular disease continue aspirin 81 mg a day and brilinta  Diet heart healthy  VS q shift  Hospitalization status voluntary  Precautions q 15 m  Dispo: back home  F/u: [facility]  Hildred Priest, MD 06/28/2016, 12:58 PM

## 2016-06-28 NOTE — Progress Notes (Signed)
Pt less anxious. [place] hall occasionally. Isolates to self and room. Pt mood is depressed with flat affect. Denies HI, AVH, but endorses passive SI. Encouragement and support offered. Safety checks maintained. Meds given as prescribed. Pt receptive and remains safe on unit with q 15 min checks.

## 2016-06-28 NOTE — B[facility] Group Notes (Signed)
  [facility] LCSW Group Therapy Note  Date/Time: 06/28/16, 0930  Type of Therapy/Topic:  Group Therapy:  Emotion Regulation  Participation Level:  Active   Mood: engaged  Description of Group:    The purpose of this group is to assist patients in learning to regulate negative emotions and experience positive emotions. Patients will be guided to discuss ways in which they have been vulnerable to their negative emotions. These vulnerabilities will be juxtaposed with experiences of positive emotions or situations, and patients challenged to use positive emotions to combat negative ones. Special emphasis will be placed on coping with negative emotions in conflict situations, and patients will process healthy conflict resolution skills.  Therapeutic Goals: 1. Patient will identify two positive emotions or experiences to reflect on in order to balance out negative emotions:  2. Patient will label two or more emotions that they find the most difficult to experience:  3. Patient will be able to demonstrate positive conflict resolution skills through discussion or role plays:   Summary of Patient Progress:Pt identified anger and anxiety as emotions that he is having difficulty managing.  Pt was engaged in group but did not contribute unless asked a question by CSW.      Therapeutic Modalities:   Cognitive Behavioral Therapy Feelings Identification Dialectical Behavioral Therapy  Lurline Idol, LCSW

## 2016-06-28 NOTE — Tx Team (Signed)
Interdisciplinary Treatment and Diagnostic Plan Update  06/28/2016 Time of Session: [place] MRN: [id number]  Principal Diagnosis: Major depressive disorder, recurrent episode, moderate with anxious distress ([place])  Secondary Diagnoses: Principal Problem:   Major depressive disorder, recurrent episode, moderate with anxious distress ([place]) Active Problems:   Cervical spondylosis without myelopathy   Tobacco use disorder   Ventricular fibrillation (HCC)   Sedative, hypnotic or anxiolytic use disorder, severe, dependence (HCC)   HTN (hypertension)   GERD (gastroesophageal reflux disease)   Alcohol use disorder, severe, 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[place])   Asthma   Combined hyperlipidemia   History of ST elevation myocardial infarction (STEMI)   Cellulitis   Current Medications:  Current Facility-Administered Medications  Medication Dose Route Frequency Provider Last Rate Last Dose  . acetaminophen (TYLENOL) tablet 1,000 mg  1,000 mg Oral Q6H PRN Hildred Priest, MD   1,000 mg at 06/28/16 1158  . alum & mag hydroxide-simeth (MAALOX/MYLANTA) 200-200-20 MG/5ML suspension 30 mL  30 mL Oral Q4H PRN Jolanta B Pucilowska, MD      . aspirin EC tablet 81 mg  81 mg Oral Daily Hildred Priest, MD   81 mg at 06/28/16 0811  . atorvastatin (LIPITOR) tablet 80 mg  80 mg Oral QPM Hildred Priest, MD   80 mg at 06/27/16 1801  . carvedilol (COREG) tablet 6.25 mg  6.25 mg Oral BID Hildred Priest, MD   6.25 mg at 06/28/16 0811  . cephALEXin (KEFLEX) capsule 500 mg  500 mg Oral Q8H Hildred Priest, MD   500 mg at 06/28/16 1246  . cyclobenzaprine (FLEXERIL) tablet 5 mg  5 mg Oral TID Hildred Priest, MD   5 mg at 06/28/16 1158  . doxepin (SINEQUAN) capsule 75 mg  75 mg Oral QHS Hildred Priest, MD      . fluvoxaMINE (LUVOX) tablet 50 mg  50 mg Oral BID Hildred Priest, MD      . haloperidol (HALDOL) tablet 0.5 mg  0.5 mg  Oral TID Hildred Priest, MD   0.5 mg at 06/28/16 1157  . ibuprofen (ADVIL,MOTRIN) tablet 600 mg  600 mg Oral Q6H PRN Hildred Priest, MD   600 mg at 06/27/16 2124  . magnesium hydroxide (MILK OF MAGNESIA) suspension 30 mL  30 mL Oral Daily PRN Jolanta B Pucilowska, MD      . nicotine (NICODERM CQ - dosed in mg/24 hours) patch 21 mg  21 mg Transdermal Q0600 Jolanta B Pucilowska, MD   21 mg at 06/28/16 0522  . nitroGLYCERIN (NITROSTAT) SL tablet 0.4 mg  0.4 mg Sublingual Q5 Min x 3 PRN Hildred Priest, MD      . ticagrelor (BRILINTA) tablet 90 mg  90 mg Oral BID Hildred Priest, MD   90 mg at 06/28/16 0962   PTA Medications: Prescriptions Prior to Admission  Medication Sig Dispense Refill Last Dose  . aspirin 81 MG EC tablet Take 1 tablet (81 mg total) by mouth daily. 30 tablet 1 unknown at unknown  . atorvastatin (LIPITOR) 80 MG tablet Take 80 mg by mouth every evening.   unknown at unknown  . baclofen (LIORESAL) 10 MG tablet Take 10 mg by mouth 2 (two) times daily as needed for muscle spasms.   unknown at unknown  . benztropine (COGENTIN) 1 MG tablet Take 1 mg by mouth daily as needed for tremors.   unknown at unknown  . carvedilol (COREG) 6.25 MG tablet Take 6.25 mg by mouth 2 (two) times daily.   unknown at unknown  .  fluvoxaMINE (LUVOX) 100 MG tablet Take 1 tablet (100 mg total) by mouth at bedtime. 30 tablet 1 unknown at unknown  . gabapentin (NEURONTIN) 400 MG capsule Take 400 mg by mouth 3 (three) times daily.   unknown at unknown  . hydrOXYzine (ATARAX/VISTARIL) 25 MG tablet Take 1 tablet (25 mg total) by mouth 3 (three) times daily as needed for anxiety. 90 tablet 1 unknown at unknown  . naltrexone (DEPADE) 50 MG tablet Take 50 mg by mouth daily.   unknown at unknown  . nitroGLYCERIN (NITROSTAT) 0.4 MG SL tablet Place 0.4 mg under the tongue every 5 (five) minutes x 3 doses as needed.   unknown at unknown  . QUEtiapine (SEROQUEL) 25 MG tablet Take  1 tablet (25 mg total) by mouth 3 (three) times daily. 90 tablet 1 unknown at unknown  . QUEtiapine (SEROQUEL) 300 MG tablet Take 1 tablet (300 mg total) by mouth at bedtime. 30 tablet 1 unknown at unknown  . ticagrelor (BRILINTA) 90 MG TABS tablet Take 1 tablet (90 mg total) by mouth 2 (two) times daily. 60 tablet 1 unknown at unknown  . traZODone (DESYREL) 50 MG tablet Take 50 mg by mouth at bedtime as needed for sleep.   unknown at unknown    Patient Stressors: Financial difficulties Legal issue Medication change or noncompliance Substance abuse  Patient Strengths: Motivation for treatment/growth Supportive family/friends  Treatment Modalities: Medication Management, Group therapy, Case management,  1 to 1 session with clinician, Psychoeducation, Recreational therapy.   Physician Treatment Plan for Primary Diagnosis: Major depressive disorder, recurrent episode, moderate with anxious distress ([place]) Long Term Goal(s): Improvement in symptoms so as ready for discharge Improvement in symptoms so as ready for discharge   Short Term Goals: Ability to identify changes in lifestyle to reduce recurrence of condition will improve Ability to demonstrate self-control will improve Ability to identify and develop effective coping behaviors will improve Ability to identify triggers associated with substance abuse/mental health issues will improve Ability to identify changes in lifestyle to reduce recurrence of condition will improve Ability to demonstrate self-control will improve Ability to identify and develop effective coping behaviors will improve Compliance with prescribed medications will improve Ability to identify triggers associated with substance abuse/mental health issues will improve  Medication Management: Evaluate patient's response, side effects, and tolerance of medication regimen.  Therapeutic Interventions: 1 to 1 sessions, Unit Group sessions and Medication  administration.  Evaluation of Outcomes: Progressing  Physician Treatment Plan for Secondary Diagnosis: Principal Problem:   Major depressive disorder, recurrent episode, moderate with anxious distress ([place]) Active Problems:   Cervical spondylosis without myelopathy   Tobacco use disorder   Ventricular fibrillation (HCC)   Sedative, hypnotic or anxiolytic use disorder, severe, dependence (HCC)   HTN (hypertension)   GERD (gastroesophageal reflux disease)   Alcohol use disorder, severe, 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[place])   Asthma   Combined hyperlipidemia   History of ST elevation myocardial infarction (STEMI)   Cellulitis  Long Term Goal(s): Improvement in symptoms so as ready for discharge Improvement in symptoms so as ready for discharge   Short Term Goals: Ability to identify changes in lifestyle to reduce recurrence of condition will improve Ability to demonstrate self-control will improve Ability to identify and develop effective coping behaviors will improve Ability to identify triggers associated with substance abuse/mental health issues will improve Ability to identify changes in lifestyle to reduce recurrence of condition will improve Ability to demonstrate self-control will improve Ability to identify and develop effective coping  behaviors will improve Compliance with prescribed medications will improve Ability to identify triggers associated with substance abuse/mental health issues will improve     Medication Management: Evaluate patient's response, side effects, and tolerance of medication regimen.  Therapeutic Interventions: 1 to 1 sessions, Unit Group sessions and Medication administration.  Evaluation of Outcomes: Progressing   RN Treatment Plan for Primary Diagnosis: Major depressive disorder, recurrent episode, moderate with anxious distress ([place]) Long Term Goal(s): Knowledge of disease and therapeutic regimen to maintain health will improve  Short Term Goals:  Ability to demonstrate self-control and Ability to identify and develop effective coping behaviors will improve  Medication Management: RN will administer medications as ordered by provider, will assess and evaluate patient's response and provide education to patient for prescribed medication. RN will report any adverse and/or side effects to prescribing provider.  Therapeutic Interventions: 1 on 1 counseling sessions, Psychoeducation, Medication administration, Evaluate responses to treatment, Monitor vital signs and CBGs as ordered, Perform/monitor CIWA, COWS, AIMS and Fall Risk screenings as ordered, Perform wound care treatments as ordered.  Evaluation of Outcomes: Progressing   LCSW Treatment Plan for Primary Diagnosis: Major depressive disorder, recurrent episode, moderate with anxious distress ([place]) Long Term Goal(s): Safe transition to appropriate next level of care at discharge, Engage patient in therapeutic group addressing interpersonal concerns.  Short Term Goals: Engage patient in aftercare planning with referrals and resources, Increase emotional regulation and Increase skills for wellness and recovery  Therapeutic Interventions: Assess for all discharge needs, 1 to 1 time with Social worker, Explore available resources and support systems, Assess for adequacy in community support network, Educate family and significant other(s) on suicide prevention, Complete Psychosocial Assessment, Interpersonal group therapy.  Evaluation of Outcomes: Progressing    Recreational Therapy Treatment Plan for Primary Diagnosis: Major depressive disorder, recurrent episode, moderate with anxious distress ([place]) Long Term Goal(s): Patient will participate in recreation therapy treatment in at least 2 group sessions without prompting from LRT  Short Term Goals: Increase self-esteem, Increase stress management skills  Treatment Modalities: Group Therapy and Individual Treatment Sessions  Therapeutic  Interventions: Psychoeducation  Evaluation of Outcomes: Progressing   Progress in Treatment: Attending groups: yes Participating in groups:Yes Taking medication as prescribed: Yes. Toleration medication: Yes. Family/Significant other contact made: Yes, sister Patient understands diagnosis: Yes. Discussing patient identified problems/goals with staff: Yes. Medical problems stabilized or resolved: Yes. Denies suicidal/homicidal ideation: Yes. Issues/concerns per patient self-inventory: No. Other: none  New problem(s) identified: No, Describe:  none  New Short Term/Long Term Goal(s): Pt goal is to "learn how to cope with things better."  Discharge Plan or Barriers: Possible day treatment program.  Reason for Continuation of Hospitalization: Anxiety Depression Medication stabilization  Estimated Length of Stay:2-3 days.  Attendees: Patient: Michael Schmitt 06/28/2016   Physician: [name], MD 06/28/2016   Nursing: Floyde Parkins, RN 06/28/2016   RN Care Manager: 06/28/2016   Social Worker: Lurline Idol, LCSW 06/28/2016   Recreational Therapist: Drue Flirt, LRT/CTRS  06/28/2016   Other:  06/28/2016  Other:  06/28/2016   Other: 06/28/2016        Scribe for Treatment Team: Joanne Chars, [place] 06/28/2016 2:28 PM

## 2016-06-28 NOTE — Plan of Care (Signed)
Problem: Pain Managment: Goal: General experience of comfort will improve Outcome: Not Progressing Patient continues to complain of neck pain and focus on meds and anxiety

## 2016-06-29 MED ORDER — ACETAMINOPHEN 500 MG PO TABS
1000.0000 mg | ORAL_TABLET | Freq: Two times a day (BID) | ORAL | Status: DC
  Administered 2016-06-29 – 2016-07-03 (×8): 1000 mg via ORAL
  Filled 2016-06-29 (×10): qty 2

## 2016-06-29 MED ORDER — FLUVOXAMINE MALEATE 50 MG PO TABS
25.0000 mg | ORAL_TABLET | Freq: Two times a day (BID) | ORAL | Status: DC
  Administered 2016-06-29 – 2016-06-30 (×2): 25 mg via ORAL
  Filled 2016-06-29 (×2): qty 1

## 2016-06-29 MED ORDER — LIDOCAINE 5 % EX PTCH
1.0000 | MEDICATED_PATCH | CUTANEOUS | Status: DC
  Administered 2016-06-29 – 2016-06-30 (×2): 1 via TRANSDERMAL
  Filled 2016-06-29 (×5): qty 1

## 2016-06-29 MED ORDER — DOXEPIN HCL 25 MG PO CAPS
100.0000 mg | ORAL_CAPSULE | Freq: Every day | ORAL | Status: DC
  Administered 2016-06-29: 100 mg via ORAL
  Filled 2016-06-29: qty 4

## 2016-06-29 MED ORDER — LOPERAMIDE HCL 2 MG PO CAPS: 4.0000 mg | ORAL_CAPSULE | ORAL | Status: DC | PRN

## 2016-06-29 MED ORDER — CYCLOBENZAPRINE HCL 5 MG PO TABS
7.5000 mg | ORAL_TABLET | Freq: Three times a day (TID) | ORAL | Status: DC
  Administered 2016-06-29 – 2016-07-03 (×12): 7.5 mg via ORAL
  Filled 2016-06-29 (×15): qty 1.5

## 2016-06-29 MED ORDER — MELOXICAM 7.5 MG PO TABS
7.5000 mg | ORAL_TABLET | Freq: Two times a day (BID) | ORAL | Status: DC
  Administered 2016-06-29 – 2016-07-03 (×8): 7.5 mg via ORAL
  Filled 2016-06-29 (×9): qty 1

## 2016-06-29 NOTE — [facility] Group Notes (Signed)
[facility] LCSW Group Therapy  06/29/2016 2:35 PM  Type of Therapy:  Group Therapy  Participation Level:  Minimal  Participation Quality:  Attentive  Affect:  Appropriate  Cognitive:  Alert  Insight:  Limited  Engagement in Therapy:  Limited  Modes of Intervention:  Activity, Discussion, Education, Problem-solving, Reality Testing and Socialization, and Support  Summary of Progress/Problems: Balance in life: Patients will discuss the concept of balance and how it looks and feels to be unbalanced. Pt will identify areas in their life that is unbalanced and ways to become more balanced. They discussed what aspects in their lives has influenced their self care. Patients also discussed self care in the areas of self regulation/control, hygiene/appearance, sleep/relaxation, healthy leisure, healthy eating habits, exercise, inner peace/spirituality, self improvement, sobriety, and health management. They were challenged to identify changes that are needed in order to improve self care. Patient discussed needing to improve his eating habits and health management. CSW provided support to patient and discussed how to incorporate health management in his daily routine at home.   Josalynn Johndrow G. [place], [place] 06/29/2016, 2:37 PM

## 2016-06-29 NOTE — [facility] Group Notes (Signed)
[facility] LCSW Group Therapy Note  Type of Therapy and Topic:  Group Therapy:  Goals Group: SMART Goals  Participation Level:  Patient attended group on this date and minimally participated in the group discussion.   Description of Group:   The purpose of a daily goals group is to assist and guide patients in setting recovery/wellness-related goals.  The objective is to set goals as they relate to the crisis in which they were admitted. Patients will be using SMART goal modalities to set measurable goals.  Characteristics of realistic goals will be discussed and patients will be assisted in setting and processing how one will reach their goal. Facilitator will also assist patients in applying interventions and coping skills learned in psycho-education groups to the SMART goal and process how one will achieve defined goal.  Therapeutic Goals: -Patients will develop and document one goal related to or their crisis in which brought them into treatment. -Patients will be guided by LCSW using SMART goal setting modality in how to set a measurable, attainable, realistic and time sensitive goal.  -Patients will process barriers in reaching goal. -Patients will process interventions in how to overcome and successful in reaching goal.   Summary of Patient Progress:  Patient Goal: "I need to work on my anxiety". CSW helped patient develop the goal of developing 2 new coping skills to help patient manage his anxiety.    Therapeutic Modalities:   Motivational Interviewing Public relations account executive Therapy Crisis Intervention Model SMART goals setting  Vaughan Garfinkle G. [place], [place] 06/29/2016 10:40 AM

## 2016-06-29 NOTE — Progress Notes (Signed)
D: Pt denies SI/HI/AVH. Pt is pleasant and cooperative, affect is flat and sad. Pt was noted pacing around the halls, he appears anxious, patiet was medicated and encouraged to use his coping skills. Patient is  interacting with peers and staff appropriately.  A: Pt was offered support and encouragement. Pt was given scheduled medications. Pt was encouraged to attend groups. Q 15 minute checks were done for safety.  R:Pt attends groups and interacts well with peers and staff. Pt is taking medication. Pt has no complaints.Pt receptive to treatment and safety maintained on unit.

## 2016-06-29 NOTE — Progress Notes (Addendum)
[facility] MD Progress Note  06/29/2016 2:45 PM Michael Schmitt  MRN:  [id number] Subjective:  Patient is a 46 year old widowed Caucasian male from [place]. Patient has history of major depressive disorder and alcohol use disorder.  Last year the patient was charged with 2 DWIs and also with disorderly conduct. He is awaiting court hearing for those charges this month. He says he has been unable to deal with the anxiety related to possible outcome that he is facing which is going to jail.  Patient was able to stop drinking in February and his last drink was on February 16. Patient has been under psychiatric care for depression and anxiety for many years. He was mainly treated with high doses of clonazepam  Patient came to the emergency department voluntarily on March 30. He was evaluated by a [facility] and discharged from the emergency department. He came back again on March 31 and was once again discharged from the emergency department. Patient came back to the emergency department on April 1.  All his complaints were worsening depression and anxiety. During his last ER visit he complained about also having suicidal ideation and wanting some help.  Patient was here in our unit from March 2 to March 9. Immediately after discharge on March 10 he came to the emergency department twice. He had a third visit to the emergency department on March 17. On March 18 he went to [facility] and from there he was transferred to [facility]. He was just discharged from there on March 28 (9 day stay).  Patient complains that his anxiety is severe. He feels almost "drunk". Seroquel, Vistaril are not working. He was started on Neurontin and he has not felt any benefit from it. He is afraid of taking it as well because his father and sister were on Neurontin and had suicidality.  06/26/16 the patient denies having suicidal thoughts but does feel like life is never going to get better. He denies auditory or visual  hallucinations. He complains of having poor concentration, poor appetite poor energy, severe anxiety and severe depression.  06/27/16 patient has been to several staff members that he should not be discharged because he still suicidal. He was stating he just did that if he were to be discharged today and that he was going to kill himself. The patient has been pacing around the unit. He appears to be severely anxious and has no ability to cope with stress. Patient says that the medications are not helping. He feels that a lot of the medications he has tried over the years has really help other than clonazepam.  Patient denies any side effects. As far as physical complaints he continues to complain about chronic neck and shoulder pain.  06/28/16 patient reports sleeping a little better last night however he continued to have multiple awakenings throughout the night. He has been able to attend groups and feels that he is benefited from the material his learning. He is still having a very difficult time relaxing. He is pacing the hallways and hyperventilating all day. He does not feel the medications have been of much benefit. Continues to have passive suicidal ideation. Denies homicidal ideation or auditory or visual hallucinations. Denies side effects from medications or major physical complaints other than chronic pain.  06/29/2016 Pt has been attending and benefiting from group sessions. He does not feel better, continues to feel anxious and depressed. The only improvement he is able to verbalize is with his sleep.  He states that the doxepin is helping with sleep but nothing is helping with his anxiety or depression. He complains of severe neck and shoulder pain. Says the pain makes him feel drunk.  Per nursing: Calm on unit, compliant with care. Reports "some" depression and anxiety but verbally contracts for safety. Denies SI.HI.AVH. Visible on unit without incident. Will continue to monitor and provide  therapeutic interventions as needed  Principal Problem: Major depressive disorder, recurrent episode, moderate with anxious distress ([place]) Diagnosis:   Patient Active Problem List   Diagnosis Date Noted  . HTN (hypertension) [I10] 06/26/2016  . GERD (gastroesophageal reflux disease) [K21.9] 06/26/2016  . Alcohol use disorder, severe, 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[place]) [F10.21] 06/26/2016  . Cellulitis [L03.90] 06/26/2016  . Phencyclidine (PCP) use disorder, severe, dependence ([place]) [F16.20] 05/28/2016  . Sedative, hypnotic or anxiolytic use disorder, severe, dependence ([place]) [F13.20] 05/12/2016  . Ventricular fibrillation ([place]) [I49.01] 04/05/2016  . 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 History of ST elevation myocardial infarction (STEMI) [I25.2] 01/05/2016  . Major depressive disorder, recurrent episode, moderate with anxious distress ([place]) [F33.1] 06/21/2015  . Cervical spondylosis without myelopathy [M47.812] 07/24/2014  . Tobacco use disorder [F17.200] 10/20/2010  . Asthma [J45.909] 10/20/2010   Total Time spent with patient: 30 minutes  Past Psychiatric History: Patient reports that he has had at least 4 hospitalizations in his life, so basically most of them had happened this year as a result of the charges he received last year.  He has been a patient of Dr. suicidal at [facility] in [place] for more than 14 years. He was treated with high-dose clonazepam. High-dose clonazepam was discontinued several months ago as the patient was also drinking.  Denies any history of suicidal attempts  Past Medical History:  Past Medical History:  Diagnosis Date  . Anxiety   . Anxiety   . Arthritis    cerv. stenosis, spondylosis, HNP- lower back , has been followed in pain clinic, has  had injection s in cerv. area  . Blood dyscrasia    told that when he was young he was a" free bleeder"  . CAD (coronary artery disease)   . Cervical spondylosis without myelopathy 07/24/2014  . Cervicogenic  headache 07/24/2014  . Chronic kidney disease    renal calculi- passed spontaneously  . Depression   . Diabetes mellitus without complication ([place])   . Fatty liver   . GERD (gastroesophageal reflux disease)   . Headache(784.0)   . Hyperlipidemia   . Hypertension   . Mental disorder   . MI, old   . RLS (restless legs syndrome)    detected on sleep study  . Shortness of breath   . Ventricular fibrillation ([place]) 04/05/2016    Past Surgical History:  Procedure Laterality Date  . ANTERIOR CERVICAL DECOMP/DISCECTOMY FUSION  11/13/2011   Procedure: ANTERIOR CERVICAL DECOMPRESSION/DISCECTOMY FUSION 1 LEVEL;  Surgeon: [name], MD;  Location: [place] NEURO ORS;  Service: Neurosurgery;  Laterality: N/A;  Anterior Cervical Decompression/discectomy Fusion. Cervical three-four.  Marland Kitchen CARDIAC CATHETERIZATION N/A 01/01/2015   Procedure: Left Heart Cath and Coronary Angiography;  Surgeon: [name], MD;  Location: [place] CV LAB;  Service: Cardiovascular;  Laterality: N/A;  . CARDIAC CATHETERIZATION N/A 04/05/2016   Procedure: Left Heart Cath and Coronary Angiography;  Surgeon: Belva Crome, MD;  Location: [place] CV LAB;  Service: Cardiovascular;  Laterality: N/A;  . CARDIAC CATHETERIZATION N/A 04/05/2016   Procedure: Coronary Stent Intervention;  Surgeon: Belva Crome, MD;  Location:  [place] INVASIVE CV LAB;  Service: Cardiovascular;  Laterality: N/A;  . CARDIAC CATHETERIZATION N/A 04/05/2016   Procedure: Intravascular Ultrasound/IVUS;  Surgeon: Belva Crome, MD;  Location: [place] CV LAB;  Service: Cardiovascular;  Laterality: N/A;  . NASAL SINUS SURGERY     2005   Family History:  Family History  Problem Relation Age of Onset  . Prostate cancer Father   . Hypertension Mother   . Kidney Stones Mother   . Anxiety disorder Mother   . Depression Mother   . COPD Sister   . Hypertension Sister   . Diabetes Sister   . Depression Sister   . Anxiety disorder Sister   . Seizures Sister     Family Psychiatric  History: Denies  Social History: Patient has been disabled for years after having an injury in his neck. 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 He lives with his mother, sister and the patient's 2 children who are 9 and 93 years old. The patient's first wife died many years ago years ago. He had a long relationship after that but never got married. He had 2 children with this partner.  History  Alcohol Use  . 0.0 oz/week    Comment: trying not to drink      History  Drug Use No    Social History   Social History  . Marital status: Widowed    Spouse name: N/A  . Number of children: 3  . Years of education: 12   Occupational History  .      unemployed   Social History Main Topics  . Smoking status: Current Every Day Smoker    Packs/day: 2.00    Years: 17.00    Types: Cigarettes, Cigars  . Smokeless tobacco: Former Systems developer     Comment: occassional snuff   . Alcohol use 0.0 oz/week     Comment: trying not to drink   . Drug use: No  . Sexual activity: Not Currently   Other Topics Concern  . None   Social History Narrative   Patient is right handed.   Patient drinks 2 sodas daily.     Current Medications: Current Facility-Administered Medications  Medication Dose Route Frequency Provider Last Rate Last Dose  . acetaminophen (TYLENOL) tablet 1,000 mg  1,000 mg Oral BID Hildred Priest, MD   1,000 mg at 06/29/16 1213  . alum & mag hydroxide-simeth (MAALOX/MYLANTA) 200-200-20 MG/5ML suspension 30 mL  30 mL Oral Q4H PRN Jolanta B Pucilowska, MD      . aspirin EC tablet 81 mg  81 mg Oral Daily Hildred Priest, MD   81 mg at 06/29/16 0809  . atorvastatin (LIPITOR) tablet 80 mg  80 mg Oral QPM Hildred Priest, MD   80 mg at 06/28/16 1647  . carvedilol (COREG) tablet 6.25 mg  6.25 mg Oral BID Hildred Priest, MD   6.25 mg at 06/29/16 0645  . cephALEXin (KEFLEX) capsule 500 mg  500 mg Oral Q8H Hildred Priest, MD   500 mg at 06/29/16 1347  . cyclobenzaprine (FLEXERIL) tablet 7.5 mg  7.5 mg Oral TID Hildred Priest, MD   7.5 mg at 06/29/16 1227  . doxepin (SINEQUAN) capsule 100 mg  100 mg Oral QHS Hildred Priest, MD      . fluvoxaMINE (LUVOX) tablet 25 mg  25 mg Oral BID Hildred Priest, MD      . haloperidol (HALDOL) tablet 0.5 mg  0.5 mg Oral TID Hildred Priest, MD  0.5 mg at 06/29/16 1212  . lidocaine (LIDODERM) 5 % 1 patch  1 patch Transdermal Q24H Hildred Priest, MD   1 patch at 06/29/16 1228  . loperamide (IMODIUM) capsule 4 mg  4 mg Oral PRN Hildred Priest, MD      . magnesium hydroxide (MILK OF MAGNESIA) suspension 30 mL  30 mL Oral Daily PRN Jolanta B Pucilowska, MD      . meloxicam (MOBIC) tablet 7.5 mg  7.5 mg Oral BID Hildred Priest, MD      . nicotine (NICODERM CQ - dosed in mg/24 hours) patch 21 mg  21 mg Transdermal Q0600 Jolanta B Pucilowska, MD   21 mg at 06/29/16 0647  . nitroGLYCERIN (NITROSTAT) SL tablet 0.4 mg  0.4 mg Sublingual Q5 Min x 3 PRN Hildred Priest, MD      . ticagrelor (BRILINTA) tablet 90 mg  90 mg Oral BID Hildred Priest, MD   90 mg at 06/29/16 7412    Lab Results: No results found for this or any previous visit (from the past 65 hour(s)).  Blood Alcohol level:  Lab Results  Component Value Date   ETH <5 06/24/2016   ETH <5 87/86/7672    Metabolic Disorder Labs: Lab Results  Component Value Date   HGBA1C 5.6 05/27/2016   MPG 114 05/27/2016   MPG 123 04/05/2016   Lab Results  Component Value Date   PROLACTIN 6.5 05/27/2016   Lab Results  Component Value Date   CHOL 217 (H) 05/27/2016   TRIG 334 (H) 05/27/2016   HDL 32 (L) 05/27/2016   CHOLHDL 6.8 05/27/2016   VLDL 67 (H) 05/27/2016   LDLCALC 118 (H) 05/27/2016   LDLCALC UNABLE TO REPORT DUE TO LIPEMIC INTERFERENCE 04/05/2016    Physical Findings: AIMS:  , ,  ,  ,    CIWA:     COWS:     Musculoskeletal: Strength & Muscle Tone: within normal limits Gait & Station: normal Patient leans: N/A  Psychiatric Specialty Exam: Physical Exam  Constitutional: He is oriented to person, place, and time. He appears well-developed and well-nourished.  HENT:  Head: Normocephalic and atraumatic.  Eyes: EOM are normal.  Respiratory: Effort normal.  Neurological: He is alert and oriented to person, place, and time.    Review of Systems  Constitutional: Negative.   HENT: Negative.   Eyes: Negative.   Respiratory: Negative.   Cardiovascular: Negative.   Gastrointestinal: Negative.   Genitourinary: Negative.   Musculoskeletal: Positive for joint pain and neck pain.  Skin: Negative.   Neurological: Negative.   Endo/Heme/Allergies: Negative.   Psychiatric/Behavioral: Positive for depression, substance abuse and suicidal ideas. Negative for hallucinations and memory loss. The patient is nervous/anxious and has insomnia.     Blood pressure (!) 138/92, pulse 81, temperature 98 F (36.7 C), resp. rate 18, height 6\' 2"  (1.88 m), weight 90.7 kg (200 lb), SpO2 99 %.Body mass index is 25.68 kg/m.  General Appearance: Well Groomed  Eye Contact:  Good  Speech:  Clear and Coherent  Volume:  Normal  Mood:  Anxious and Dysphoric  Affect:  Congruent  Thought Process:  Linear and Descriptions of Associations: Intact  Orientation:  Full (Time, Place, and Person)  Thought Content:  Hallucinations: None  Suicidal Thoughts:  Yes.  without intent/plan  Homicidal Thoughts:  No  Memory:  Immediate;   Fair Recent;   Fair Remote;   Fair  Judgement:  Poor  Insight:  Shallow  Psychomotor Activity:  Increased  Concentration:  Concentration: Poor and Attention Span: Poor  Recall:  [employer] of Knowledge:  Fair  Language:  Fair  Akathisia:  No  Handed:    AIMS (if indicated):     Assets:  Agricultural consultant Housing Social Support  ADL's:  Intact   Cognition:  WNL  Sleep:  Number of Hours: 7.75     Treatment Plan Summary: Daily contact with patient to assess and evaluate symptoms and progress in treatment and Medication management   Patient is a 46 year old with depression, chronic anxiety and substance abuse issues. The patient is facing legal charges this month, discharges are related to alcohol dependence. Patient is distressed about the possible outcome of having to go to jail. Patient believes his making jail time by staying in the hospital and seems to think that the judge will not making the stain jail because he has already stated in a lockdown facility   Major depressive disorder: Patient is still very anxious, affect is blunted, pacing the hallways.  The plan today will be to increase Doxepin to 100mg  qhs. Continue luvox 50mg  bid and plan to taper off prior to discharge  Patient likes doxepin and says that he had tried in the past with good response.  For anxiety the patient has failed multiple agents. He says that Seroquel, Neurontin, Vistaril have not helped at all. Continue Haldol 0.5 mg 3 times a day for now .   For insomnia and chronic pain: continue doxepin which will be increased to150 mg  Cellulitis: Ingrown toenail. Continue  Keflex 500 mg every 8 hours until Tuesday 10  Alcohol use disorder patient has been in remission for about a month. He will benefit from intensive outpatient substance abuse  Chronic pain  increase Flexeril 7.5 mg tid and add meloxican 7.5mg  bid. Also will order lidocaine patch for neck.  Diarrhea: likely due to anxiety. Will order imodium prn  Tobacco use disorder: continue  nicotine patch 21 mg  HTN: continue coreg 6.5 mg bid  Dyslipidemia: Continue Lipitor 80 mg a day   Cardiovascular disease continue aspirin 81 mg a day and brilinta  Diet heart healthy  VS q shift  Hospitalization status voluntary  Precautions q 15 m  Dispo: back home  F/u: CBC and  also will refer to a day program. Case manager consulted for establishing primary care.  Hildred Priest, MD 06/29/2016, 2:45 PM

## 2016-06-29 NOTE — Plan of Care (Signed)
Problem: Activity: Goal: Interest or engagement in leisure activities will improve Outcome: Progressing Encouraged to participate in unit milieu, visible on unit.

## 2016-06-29 NOTE — Progress Notes (Signed)
TC with [facility], PSI.  Pt medicaid is showing as inactive.  They do not have slots available for uninsured right now.   Winferd Humphrey, MSW, LCSW Clinical Social Worker 06/29/2016 1:52 PM

## 2016-06-29 NOTE — BHH Group Notes (Signed)
[place] Group Notes:  (Nursing/MHT/Case Management/Adjunct)  Date:  06/29/2016  Time:  12:55 AM  Type of Therapy:  Group Therapy  Participation Level:  Minimal  Participation Quality:  Attentive  Affect:  Flat  Cognitive:  Alert  Insight:  Lacking  Engagement in Group:  Lacking  Modes of Intervention:  Discussion  Summary of Progress/Problems: Pt. Sat quietly in group. When asked about his goal, he stated to remain positive. When staff asked was he able to accomplish this goal. Pt shrugged and shook his head. Staff attempted to get him to elaborate. He did not say anything else.   Jenetta Downer Gerilynn Mccullars 06/29/2016, 12:55 AM

## 2016-06-29 NOTE — Progress Notes (Signed)
Calm on unit, compliant with care. Reports "some" depression and anxiety but verbally contracts for safety. Denies SI.HI.AVH. Visible on unit without incident. Will continue to monitor and provide therapeutic interventions as needed.

## 2016-06-29 NOTE — Progress Notes (Signed)
Recreation Therapy Notes  Date: 04.05.18 Time: 9:30 am Location: Craft Room  Group Topic: Leisure Education  Goal Area(s) Addresses:  Patient will identify what type of leisure they prefer (health and wellness/social/etc.) Patient will identify one activity they would like to participate in that addresses their personal leisure interest. Patient will identify one leisure activity they currently participate in that addresses their personal leisure interest.  Behavioral Response: Attentive  Intervention: Leisure Skills Checklist  Activity: Patients were given a Leisure Skills Checklist and were instructed to complete the worksheet and add their scores.  Education: LRT educated patients on what they need to participate in leisure.  Education Outcome: Acknowledges education/In group clarification offered  Clinical Observations/Feedback: Patient completed worksheet. Patient did not contribute to group discussion.  Leonette Monarch, LRT/CTRS 06/29/2016 10:35 AM

## 2016-06-30 MED ORDER — RISPERIDONE 1 MG PO TABS
1.0000 mg | ORAL_TABLET | Freq: Two times a day (BID) | ORAL | Status: DC
  Administered 2016-06-30: 1 mg via ORAL
  Filled 2016-06-30: qty 1

## 2016-06-30 MED ORDER — DOXEPIN HCL 25 MG PO CAPS
125.0000 mg | ORAL_CAPSULE | Freq: Every day | ORAL | Status: DC
  Filled 2016-06-30: qty 1

## 2016-06-30 MED ORDER — ROPINIROLE HCL 0.25 MG PO TABS
0.2500 mg | ORAL_TABLET | Freq: Every day | ORAL | Status: DC
  Administered 2016-06-30 – 2016-07-02 (×3): 0.25 mg via ORAL
  Filled 2016-06-30 (×3): qty 1

## 2016-06-30 MED ORDER — DOXEPIN HCL 50 MG PO CAPS
100.0000 mg | ORAL_CAPSULE | Freq: Every day | ORAL | Status: DC
  Administered 2016-06-30 – 2016-07-02 (×3): 100 mg via ORAL
  Filled 2016-06-30: qty 2
  Filled 2016-06-30 (×3): qty 1

## 2016-06-30 MED ORDER — RISPERIDONE 1 MG PO TABS
0.5000 mg | ORAL_TABLET | Freq: Two times a day (BID) | ORAL | Status: DC
  Administered 2016-07-01 – 2016-07-03 (×6): 0.5 mg via ORAL
  Filled 2016-06-30 (×7): qty 1

## 2016-06-30 NOTE — BHH Group Notes (Signed)
[place] LCSW Group Therapy  06/30/2016 11:15 AM  Type of Therapy:  Group Therapy  Participation Level:  Minimal  Participation Quality:  Attentive  Affect:  Appropriate  Cognitive:  Alert  Insight:  Limited  Engagement in Therapy:  Limited  Modes of Intervention:  Activity, Discussion, Education, Limit-setting, Problem-solving, Reality Testing, Socialization and Support  Summary of Progress/Problems: Stress management: Patients defined and discussed the topic of stress and the related symptoms and triggers for stress. Patients identified healthy coping skills they would like to try during hospitalization and after discharge to manage stress in a healthy way. CSW offered insight to varying stress management techniques. Patient stated he feels better and that his sleep and appetite have improved. Patient reports still feeling anxious. CSW provided support to patient and discussed coping strategies to manage stress.   Alvina Strother [place], [place] 06/30/2016, 11:18 AM

## 2016-06-30 NOTE — Progress Notes (Signed)
D: Pt denies SI/HI/AVH. Pt is pleasant and cooperative, affect is flat and sad. Pt thoughts are organized, he appears less anxious and he is interacting with peers and staff appropriately.  A: Pt was offered support and encouragement. Pt was given scheduled medications. Pt was encouraged to attend groups. Q 15 minute checks were done for safety.  R:Pt attends groups and interacts well with peers and staff. Pt is complaint with medication. Pt has no complaints.Pt receptive to treatment and safety maintained on unit.

## 2016-06-30 NOTE — Progress Notes (Signed)
Patient continues to endorse depression rates it as a 8/10.  Anxiety as 8/10.  Hopelessness as a 8/10.  Denies SI to this Probation officer although on self inventory writes that has SI but no plan.  Denies AVH.  Multiple somatic complaints.  [place] halls.  Medication and group compliant.  Minimal interaction noted with peers.  Support and encouragement offered.  Safety maintained.

## 2016-06-30 NOTE — BHH Group Notes (Signed)
[place] Group Notes:  (Nursing/MHT/Case Management/Adjunct)  Date:  06/30/2016  Time:  3:10 AM  Type of Therapy:  Psychoeducational Skills  Participation Level:  Did Not Attend  Participation Quality Summary of Progress/Problems:  Michael Schmitt 06/30/2016, 3:10 AM

## 2016-06-30 NOTE — Progress Notes (Addendum)
[facility] MD Progress Note  06/30/2016 10:23 AM Michael Schmitt  MRN:  [id number] Subjective:  Patient is a 46 year old widowed Caucasian male from [place]. Patient has history of major depressive disorder and alcohol use disorder.  Last year the patient was charged with 2 DWIs and also with disorderly conduct. He is awaiting court hearing for those charges this month. He says he has been unable to deal with the anxiety related to possible outcome that he is facing which is going to jail.  Patient was able to stop drinking in February and his last drink was on February 16. Patient has been under psychiatric care for depression and anxiety for many years. He was mainly treated with high doses of clonazepam  Patient came to the emergency department voluntarily on March 30. He was evaluated by a [facility] and discharged from the emergency department. He came back again on March 31 and was once again discharged from the emergency department. Patient came back to the emergency department on April 1.  All his complaints were worsening depression and anxiety. During his last ER visit he complained about also having suicidal ideation and wanting some help.  Patient was here in our unit from March 2 to March 9. Immediately after discharge on March 10 he came to the emergency department twice. He had a third visit to the emergency department on March 17. On March 18 he went to [facility] and from there he was transferred to [facility]. He was just discharged from there on March 28 (9 day stay).  Patient complains that his anxiety is severe. He feels almost "drunk". Seroquel, Vistaril are not working. He was started on Neurontin and he has not felt any benefit from it. He is afraid of taking it as well because his father and sister were on Neurontin and had suicidality.  06/26/16 the patient denies having suicidal thoughts but does feel like life is never going to get better. He denies auditory or visual  hallucinations. He complains of having poor concentration, poor appetite poor energy, severe anxiety and severe depression.  06/27/16 patient has been to several staff members that he should not be discharged because he still suicidal. He was stating he just did that if he were to be discharged today and that he was going to kill himself. The patient has been pacing around the unit. He appears to be severely anxious and has no ability to cope with stress. Patient says that the medications are not helping. He feels that a lot of the medications he has tried over the years has really help other than clonazepam.  Patient denies any side effects. As far as physical complaints he continues to complain about chronic neck and shoulder pain.  06/28/16 patient reports sleeping a little better last night however he continued to have multiple awakenings throughout the night. He has been able to attend groups and feels that he is benefited from the material his learning. He is still having a very difficult time relaxing. He is pacing the hallways and hyperventilating all day. He does not feel the medications have been of much benefit. Continues to have passive suicidal ideation. Denies homicidal ideation or auditory or visual hallucinations. Denies side effects from medications or major physical complaints other than chronic pain.  06/29/2016 Pt has been attending and benefiting from group sessions. He does not feel better, continues to feel anxious and depressed. The only improvement he is able to verbalize is with his sleep.  He states that the doxepin is helping with sleep but nothing is helping with his anxiety or depression. He complains of severe neck and shoulder pain. Says the pain makes him feel drunk.  06/30/16 much change. He does appear to be more relaxed but he does not report significant improvement. He said that with with the doxepin he wasn't sleeping okay up until last night. He had multiple awakenings  overnight. He also reports some discomfort on his legs. Tells me his being diagnosed with restless legs in the past. He still feels depressed, very anxious, and his pain aggravates his mood and anxiety. He is still her poor hopelessness and helplessness. Fear of facing legal at the end of the month.  Per nursing:  D: Pt denies SI/HI/AVH. Pt is pleasant and cooperative, affect is flat and sad. Pt thoughts are organized, he appears less anxious and he is interacting with peers and staff appropriately.  A: Pt was offered support and encouragement. Pt was given scheduled medications. Pt was encouraged to attend groups. Q 15 minute checks were done for safety.  R:Pt attends groups and interacts well with peers and staff. Pt is complaint with medication. Pt has no complaints.Pt receptive to treatment and safety maintained on unit.  Calm on unit, compliant with care. Reports "some" depression and anxiety but verbally contracts for safety. Denies SI.HI.AVH. Visible on unit without incident. Will continue to monitor and provide therapeutic interventions as needed.   Principal Problem: Major depressive disorder, recurrent episode, moderate with anxious distress ([place]) Diagnosis:   Patient Active Problem List   Diagnosis Date Noted  . HTN (hypertension) [I10] 06/26/2016  . GERD (gastroesophageal reflux disease) [K21.9] 06/26/2016  . Alcohol use disorder, severe, 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[place]) [F10.21] 06/26/2016  . Cellulitis [L03.90] 06/26/2016  . Phencyclidine (PCP) use disorder, severe, dependence ([place]) [F16.20] 05/28/2016  . Sedative, hypnotic or anxiolytic use disorder, severe, dependence ([place]) [F13.20] 05/12/2016  . Ventricular fibrillation ([place]) [I49.01] 04/05/2016  . 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 History of ST elevation myocardial infarction (STEMI) [I25.2] 01/05/2016  . Major depressive disorder, recurrent episode, moderate with anxious distress ([place]) [F33.1] 06/21/2015  . Cervical  spondylosis without myelopathy [M47.812] 07/24/2014  . Tobacco use disorder [F17.200] 10/20/2010  . Asthma [J45.909] 10/20/2010   Total Time spent with patient: 30 minutes  Past Psychiatric History: Patient reports that he has had at least 4 hospitalizations in his life, so basically most of them had happened this year as a result of the charges he received last year.  He has been a patient of Dr. suicidal at [facility] in [place] for more than 14 years. He was treated with high-dose clonazepam. High-dose clonazepam was discontinued several months ago as the patient was also drinking.  Denies any history of suicidal attempts  Past Medical History:  Past Medical History:  Diagnosis Date  . Anxiety   . Anxiety   . Arthritis    cerv. stenosis, spondylosis, HNP- lower back , has been followed in pain clinic, has  had injection s in cerv. area  . Blood dyscrasia    told that when he was young he was a" free bleeder"  . CAD (coronary artery disease)   . Cervical spondylosis without myelopathy 07/24/2014  . Cervicogenic headache 07/24/2014  . Chronic kidney disease    renal calculi- passed spontaneously  . Depression   . Diabetes mellitus without complication ([place])   . Fatty liver   . GERD (gastroesophageal reflux disease)   . Headache(784.0)   .  Hyperlipidemia   . Hypertension   . Mental disorder   . MI, old   . RLS (restless legs syndrome)    detected on sleep study  . Shortness of breath   . Ventricular fibrillation ([place]) 04/05/2016    Past Surgical History:  Procedure Laterality Date  . ANTERIOR CERVICAL DECOMP/DISCECTOMY FUSION  11/13/2011   Procedure: ANTERIOR CERVICAL DECOMPRESSION/DISCECTOMY FUSION 1 LEVEL;  Surgeon: [name], MD;  Location: [place] [facility];  Service: Neurosurgery;  Laterality: N/A;  Anterior Cervical Decompression/discectomy Fusion. Cervical three-four.  Marland Kitchen CARDIAC CATHETERIZATION N/A 01/01/2015   Procedure: Left Heart Cath and Coronary Angiography;  Surgeon:  [name], MD;  Location: [place] CV LAB;  Service: Cardiovascular;  Laterality: N/A;  . CARDIAC CATHETERIZATION N/A 04/05/2016   Procedure: Left Heart Cath and Coronary Angiography;  Surgeon: Belva Crome, MD;  Location: [place] [facility];  Service: Cardiovascular;  Laterality: N/A;  . CARDIAC CATHETERIZATION N/A 04/05/2016   Procedure: Coronary Stent Intervention;  Surgeon: Belva Crome, MD;  Location: [place] [facility];  Service: Cardiovascular;  Laterality: N/A;  . CARDIAC CATHETERIZATION N/A 04/05/2016   Procedure: Intravascular Ultrasound/IVUS;  Surgeon: Belva Crome, MD;  Location: [place] [facility];  Service: Cardiovascular;  Laterality: N/A;  . NASAL SINUS SURGERY     2005   Family History:  Family History  Problem Relation Age of Onset  . Prostate cancer Father   . Hypertension Mother   . Kidney Stones Mother   . Anxiety disorder Mother   . Depression Mother   . COPD Sister   . Hypertension Sister   . Diabetes Sister   . Depression Sister   . Anxiety disorder Sister   . Seizures Sister    Family Psychiatric  History: Denies  Social History: Patient has been disabled for years after having an injury in his neck. 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 He lives with his mother, sister and the patient's 2 children who are 40 and 13 years old. The patient's first wife died many years ago years ago. He had a long relationship after that but never got married. He had 2 children with this partner.  History  Alcohol Use  . 0.0 oz/week    Comment: trying not to drink      History  Drug Use No    Social History   Social History  . Marital status: Widowed    Spouse name: N/A  . Number of children: 3  . Years of education: 12   Occupational History  .      unemployed   Social History Main Topics  . Smoking status: Current Every Day Smoker    Packs/day: 2.00    Years: 17.00    Types: Cigarettes, Cigars  . Smokeless tobacco: Former Systems developer     Comment:  occassional snuff   . Alcohol use 0.0 oz/week     Comment: trying not to drink   . Drug use: No  . Sexual activity: Not Currently   Other Topics Concern  . None   Social History Narrative   Patient is right handed.   Patient drinks 2 sodas daily.     Current Medications: Current Facility-Administered Medications  Medication Dose Route Frequency Provider Last Rate Last Dose  . acetaminophen (TYLENOL) tablet 1,000 mg  1,000 mg Oral BID Hildred Priest, MD   1,000 mg at 06/30/16 0651  . alum & mag hydroxide-simeth (MAALOX/MYLANTA) 200-200-20 MG/5ML suspension 30 mL  30  mL Oral Q4H PRN Clovis Fredrickson, MD      . aspirin EC tablet 81 mg  81 mg Oral Daily Hildred Priest, MD   81 mg at 06/30/16 0843  . atorvastatin (LIPITOR) tablet 80 mg  80 mg Oral QPM Hildred Priest, MD   80 mg at 06/29/16 1658  . carvedilol (COREG) tablet 6.25 mg  6.25 mg Oral BID Hildred Priest, MD   6.25 mg at 06/30/16 0843  . cephALEXin (KEFLEX) capsule 500 mg  500 mg Oral Q8H Hildred Priest, MD   500 mg at 06/30/16 0651  . cyclobenzaprine (FLEXERIL) tablet 7.5 mg  7.5 mg Oral TID Hildred Priest, MD   7.5 mg at 06/30/16 0843  . doxepin (SINEQUAN) capsule 125 mg  125 mg Oral QHS Hildred Priest, MD      . lidocaine (LIDODERM) 5 % 1 patch  1 patch Transdermal Q24H Hildred Priest, MD   1 patch at 06/29/16 1228  . loperamide (IMODIUM) capsule 4 mg  4 mg Oral PRN Hildred Priest, MD      . magnesium hydroxide (MILK OF MAGNESIA) suspension 30 mL  30 mL Oral Daily PRN Jolanta B Pucilowska, MD      . meloxicam (MOBIC) tablet 7.5 mg  7.5 mg Oral BID Hildred Priest, MD   7.5 mg at 06/30/16 0843  . nicotine (NICODERM CQ - dosed in mg/24 hours) patch 21 mg  21 mg Transdermal Q0600 Clovis Fredrickson, MD   21 mg at 06/30/16 9242  . nitroGLYCERIN (NITROSTAT) SL tablet 0.4 mg  0.4 mg Sublingual Q5 Min x 3 PRN Hildred Priest, MD      . risperiDONE (RISPERDAL) tablet 1 mg  1 mg Oral BID Hildred Priest, MD      . ticagrelor [facility]) tablet 90 mg  90 mg Oral BID Hildred Priest, MD   90 mg at 06/30/16 6834    Lab Results: No results found for this or any previous visit (from the past 48 hour(s)).  Blood Alcohol level:  Lab Results  Component Value Date   ETH <5 06/24/2016   ETH <5 19/62/2297    Metabolic Disorder Labs: Lab Results  Component Value Date   HGBA1C 5.6 05/27/2016   MPG 114 05/27/2016   MPG 123 04/05/2016   Lab Results  Component Value Date   PROLACTIN 6.5 05/27/2016   Lab Results  Component Value Date   CHOL 217 (H) 05/27/2016   TRIG 334 (H) 05/27/2016   HDL 32 (L) 05/27/2016   CHOLHDL 6.8 05/27/2016   VLDL 67 (H) 05/27/2016   LDLCALC 118 (H) 05/27/2016   LDLCALC UNABLE TO REPORT DUE TO LIPEMIC INTERFERENCE 04/05/2016    Physical Findings: AIMS:  , ,  ,  ,    CIWA:    COWS:     Musculoskeletal: Strength & Muscle Tone: within normal limits Gait & Station: normal Patient leans: N/A  Psychiatric Specialty Exam: Physical Exam  Constitutional: He is oriented to person, place, and time. He appears well-developed and well-nourished.  HENT:  Head: Normocephalic and atraumatic.  Eyes: EOM are normal.  Respiratory: Effort normal.  Neurological: He is alert and oriented to person, place, and time.    Review of Systems  Constitutional: Negative.   HENT: Negative.   Eyes: Negative.   Respiratory: Negative.   Cardiovascular: Negative.   Gastrointestinal: Negative.   Genitourinary: Negative.   Musculoskeletal: Positive for joint pain and neck pain.  Skin: Negative.   Neurological: Negative.   Endo/Heme/Allergies:  Negative.   Psychiatric/Behavioral: Positive for depression, substance abuse and suicidal ideas. Negative for hallucinations and memory loss. The patient is nervous/anxious and has insomnia.     Blood pressure 136/86, pulse  85, temperature 98.6 F (37 C), temperature source Oral, resp. rate 18, height 6\' 2"  (1.88 m), weight 90.7 kg (200 lb), SpO2 98 %.Body mass index is 25.68 kg/m.  General Appearance: Well Groomed  Eye Contact:  Good  Speech:  Clear and Coherent  Volume:  Normal  Mood:  Anxious and Dysphoric  Affect:  Congruent  Thought Process:  Linear and Descriptions of Associations: Intact  Orientation:  Full (Time, Place, and Person)  Thought Content:  Hallucinations: None  Suicidal Thoughts:  passive SI  Homicidal Thoughts:  No  Memory:  Immediate;   Fair Recent;   Fair Remote;   Fair  Judgement:  Poor  Insight:  Shallow  Psychomotor Activity:  Increased  Concentration:  Concentration: Poor and Attention Span: Poor  Recall:  [employer] of Knowledge:  Fair  Language:  Fair  Akathisia:  No  Handed:    AIMS (if indicated):     Assets:  Agricultural consultant Housing Social Support  ADL's:  Intact  Cognition:  WNL  Sleep:  Number of Hours: 7.15     Treatment Plan Summary: Daily contact with patient to assess and evaluate symptoms and progress in treatment and Medication management   Patient is a 46 year old with depression, chronic anxiety and substance abuse issues. The patient is facing legal charges this month, discharges are related to alcohol dependence. Patient is distressed about the possible outcome of having to go to jail. Patient believes his making jail time by staying in the hospital and seems to think that the judge will not making the stain jail because he has already stated in a lockdown facility   Major depressive disorder: Patient is still very anxious, affect is blunted, pacing the hallways.  The plan today will be to Continue Doxepin 100 mg qhs. Will d/c  luvox today  Patient likes doxepin and says that he had tried in the past with good response.  For anxiety the patient has failed multiple agents. He says that Seroquel, Neurontin,  Vistaril have not helped at all. In preparation for d/c I will change haldol to risperdal as I'm concern he might take more than prescribe and have a dystonic reaction. Will start risperdal 0.5 mg po bid  For insomnia and chronic pain: continue doxepin which will be increased to 100 mg  Cellulitis: Ingrown toenail. Continue  Keflex 500 mg every 8 hours until Tuesday 10  Alcohol use disorder patient has been in remission for about a month. He will benefit from intensive outpatient substance abuse  Chronic pain  increase Flexeril 7.5 mg tid and add meloxican 7.5mg  bid.   Restless leg syndrome patient has been started on Requip 0.25 mg daily at bedtime  Diarrhea: likely due to anxiety. Continue  imodium prn  Tobacco use disorder: continue  nicotine patch 21 mg  HTN: continue coreg 6.5 mg bid  Dyslipidemia: Continue Lipitor 80 mg a day   Cardiovascular disease continue aspirin 81 mg a day and brilinta  Diet heart healthy  VS q shift  Hospitalization status voluntary  Precautions q 15 m  Dispo: back home  F/u: CBC and also will refer to a day program. Case manager consulted for establishing primary care.  Plan to d/c home in the next 3 days  Hildred Priest, MD  06/30/2016, 10:23 AM

## 2016-06-30 NOTE — Progress Notes (Signed)
Recreation Therapy Notes  Date:04.06.18 Time:1:00 pm Location:Craft Room  Group Topic:Social Skills  Goal Area(s) Addresses: Patient will work in teams towards shared goal. Patient will verbalize skills needed to make activity successful Patient will verbalize benefit of using skills identified to reach post d/c goals  Behavioral Response: Attentive, Interactive  Intervention: Landing Pad  Activity:Patients were put in groups and given 15 straws and approximately 3 feet of tape. Patients were instructed to build a landing pad that would catch and secure a golf ball that would be dropped from approximately 4 feet.  Education:LRT educated patients on healthy support systems.  Education Outcome: Acknowledges education/In group clarification offered  Clinical Observations/Feedback:Patient worked with peers towards shared goal. Patient used effective communication, problem solving, and Scientist, clinical (histocompatibility and immunogenetics). Patient did not contribute to group discussion.  Leonette Monarch, LRT/CTRS 06/30/2016 4:37 PM

## 2016-06-30 NOTE — Tx Team (Signed)
Interdisciplinary Treatment and Diagnostic Plan Update  06/30/2016 Time of Session: 11:00am YOLTZIN BARG MRN: [id number]  Principal Diagnosis: Major depressive disorder, recurrent episode, moderate with anxious distress ([place])  Secondary Diagnoses: Principal Problem:   Major depressive disorder, recurrent episode, moderate with anxious distress ([place]) Active Problems:   Cervical spondylosis without myelopathy   Tobacco use disorder   Ventricular fibrillation (HCC)   Sedative, hypnotic or anxiolytic use disorder, severe, dependence (HCC)   HTN (hypertension)   GERD (gastroesophageal reflux disease)   Alcohol use disorder, severe, 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[place])   Asthma   Combined hyperlipidemia   History of ST elevation myocardial infarction (STEMI)   Cellulitis   Current Medications:  Current Facility-Administered Medications  Medication Dose Route Frequency Provider Last Rate Last Dose  . acetaminophen (TYLENOL) tablet 1,000 mg  1,000 mg Oral BID Hildred Priest, MD   1,000 mg at 06/30/16 0651  . alum & mag hydroxide-simeth (MAALOX/MYLANTA) 200-200-20 MG/5ML suspension 30 mL  30 mL Oral Q4H PRN Jolanta B Pucilowska, MD      . aspirin EC tablet 81 mg  81 mg Oral Daily Hildred Priest, MD   81 mg at 06/30/16 0843  . atorvastatin (LIPITOR) tablet 80 mg  80 mg Oral QPM Hildred Priest, MD   80 mg at 06/29/16 1658  . carvedilol (COREG) tablet 6.25 mg  6.25 mg Oral BID Hildred Priest, MD   6.25 mg at 06/30/16 0843  . cephALEXin (KEFLEX) capsule 500 mg  500 mg Oral Q8H Hildred Priest, MD   500 mg at 06/30/16 1233  . cyclobenzaprine (FLEXERIL) tablet 7.5 mg  7.5 mg Oral TID Hildred Priest, MD   7.5 mg at 06/30/16 0843  . doxepin (SINEQUAN) capsule 100 mg  100 mg Oral QHS Hildred Priest, MD      . lidocaine (LIDODERM) 5 % 1 patch  1 patch Transdermal Q24H Hildred Priest, MD   1 patch at 06/30/16 1233  .  loperamide (IMODIUM) capsule 4 mg  4 mg Oral PRN Hildred Priest, MD      . magnesium hydroxide (MILK OF MAGNESIA) suspension 30 mL  30 mL Oral Daily PRN Jolanta B Pucilowska, MD      . meloxicam (MOBIC) tablet 7.5 mg  7.5 mg Oral BID Hildred Priest, MD   7.5 mg at 06/30/16 0843  . nicotine (NICODERM CQ - dosed in mg/24 hours) patch 21 mg  21 mg Transdermal Q0600 Clovis Fredrickson, MD   21 mg at 06/30/16 3614  . nitroGLYCERIN (NITROSTAT) SL tablet 0.4 mg  0.4 mg Sublingual Q5 Min x 3 PRN Hildred Priest, MD      . Derrill Memo ON 07/01/2016] risperiDONE (RISPERDAL) tablet 0.5 mg  0.5 mg Oral BID Hildred Priest, MD      . rOPINIRole (REQUIP) tablet 0.25 mg  0.25 mg Oral QHS Hildred Priest, MD      . ticagrelor [facility]) tablet 90 mg  90 mg Oral BID Hildred Priest, MD   90 mg at 06/30/16 4315   PTA Medications: Prescriptions Prior to Admission  Medication Sig Dispense Refill Last Dose  . aspirin 81 MG EC tablet Take 1 tablet (81 mg total) by mouth daily. 30 tablet 1 unknown at unknown  . atorvastatin (LIPITOR) 80 MG tablet Take 80 mg by mouth every evening.   unknown at unknown  . baclofen (LIORESAL) 10 MG tablet Take 10 mg by mouth 2 (two) times daily as needed for muscle spasms.   unknown at unknown  . benztropine (  COGENTIN) 1 MG tablet Take 1 mg by mouth daily as needed for tremors.   unknown at unknown  . carvedilol (COREG) 6.25 MG tablet Take 6.25 mg by mouth 2 (two) times daily.   unknown at unknown  . fluvoxaMINE (LUVOX) 100 MG tablet Take 1 tablet (100 mg total) by mouth at bedtime. 30 tablet 1 unknown at unknown  . gabapentin (NEURONTIN) 400 MG capsule Take 400 mg by mouth 3 (three) times daily.   unknown at unknown  . hydrOXYzine (ATARAX/VISTARIL) 25 MG tablet Take 1 tablet (25 mg total) by mouth 3 (three) times daily as needed for anxiety. 90 tablet 1 unknown at unknown  . naltrexone (DEPADE) 50 MG tablet Take 50 mg by mouth  daily.   unknown at unknown  . nitroGLYCERIN (NITROSTAT) 0.4 MG SL tablet Place 0.4 mg under the tongue every 5 (five) minutes x 3 doses as needed.   unknown at unknown  . QUEtiapine (SEROQUEL) 25 MG tablet Take 1 tablet (25 mg total) by mouth 3 (three) times daily. 90 tablet 1 unknown at unknown  . QUEtiapine (SEROQUEL) 300 MG tablet Take 1 tablet (300 mg total) by mouth at bedtime. 30 tablet 1 unknown at unknown  . ticagrelor (BRILINTA) 90 MG TABS tablet Take 1 tablet (90 mg total) by mouth 2 (two) times daily. 60 tablet 1 unknown at unknown  . traZODone (DESYREL) 50 MG tablet Take 50 mg by mouth at bedtime as needed for sleep.   unknown at unknown    Patient Stressors: Financial difficulties Legal issue Medication change or noncompliance Substance abuse  Patient Strengths: Motivation for treatment/growth Supportive family/friends  Treatment Modalities: Medication Management, Group therapy, Case management,  1 to 1 session with clinician, Psychoeducation, Recreational therapy.   Physician Treatment Plan for Primary Diagnosis: Major depressive disorder, recurrent episode, moderate with anxious distress ([place]) Long Term Goal(s): Improvement in symptoms so as ready for discharge Improvement in symptoms so as ready for discharge   Short Term Goals: Ability to identify changes in lifestyle to reduce recurrence of condition will improve Ability to demonstrate self-control will improve Ability to identify and develop effective coping behaviors will improve Ability to identify triggers associated with substance abuse/mental health issues will improve Ability to identify changes in lifestyle to reduce recurrence of condition will improve Ability to demonstrate self-control will improve Ability to identify and develop effective coping behaviors will improve Compliance with prescribed medications will improve Ability to identify triggers associated with substance abuse/mental health issues will  improve  Medication Management: Evaluate patient's response, side effects, and tolerance of medication regimen.  Therapeutic Interventions: 1 to 1 sessions, Unit Group sessions and Medication administration.  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[place])   Asthma   Combined hyperlipidemia   History of ST elevation myocardial infarction (STEMI)   Cellulitis  Long Term Goal(s): Improvement in symptoms so as ready for discharge Improvement in symptoms so as ready for discharge   Short Term Goals: Ability to identify changes in lifestyle to reduce recurrence of condition will improve Ability to demonstrate self-control will improve Ability to identify and develop effective  coping behaviors will improve Ability to identify triggers associated with substance abuse/mental health issues will improve Ability to identify changes in lifestyle to reduce recurrence of condition will improve Ability to demonstrate self-control will improve Ability to identify and develop effective coping behaviors will improve Compliance with prescribed medications will improve Ability to identify triggers associated with substance abuse/mental health issues will improve     Medication Management: Evaluate patient's response, side effects, and tolerance of medication regimen.  Therapeutic Interventions: 1 to 1 sessions, Unit Group sessions and Medication administration.  Evaluation of Outcomes: Progressing   RN Treatment Plan for Primary Diagnosis: Major  depressive disorder, recurrent episode, moderate with anxious distress ([place]) Long Term Goal(s): Knowledge of disease and therapeutic regimen to maintain health will improve  Short Term Goals: Ability to demonstrate self-control and Ability to identify and develop effective coping behaviors will improve  Medication Management: RN will administer medications as ordered by provider, will assess and evaluate patient's response and provide education to patient for prescribed medication. RN will report any adverse and/or side effects to prescribing provider.  Therapeutic Interventions: 1 on 1 counseling sessions, Psychoeducation, Medication administration, Evaluate responses to treatment, Monitor vital signs and CBGs as ordered, Perform/monitor CIWA, COWS, AIMS and Fall Risk screenings as ordered, Perform wound care treatments as ordered.  Evaluation of Outcomes: Progressing   LCSW Treatment Plan for Primary Diagnosis: Major depressive disorder, recurrent episode, moderate with anxious distress ([place]) Long Term Goal(s): Safe transition to appropriate next level of care at discharge, Engage patient in therapeutic group addressing interpersonal concerns.  Short Term Goals: Engage patient in aftercare planning with referrals and resources, Increase emotional regulation and Increase skills for wellness and recovery  Therapeutic Interventions: Assess for all discharge needs, 1 to 1 time with Social worker, Explore available resources and support systems, Assess for adequacy in community support network, Educate family and significant other(s) on suicide prevention, Complete Psychosocial Assessment, Interpersonal group therapy.  Evaluation of Outcomes: Progressing    Recreational Therapy Treatment Plan for Primary Diagnosis: Major depressive disorder, recurrent episode, moderate with anxious distress ([place]) Long Term Goal(s): Patient will participate in recreation therapy treatment in at least 2 group  sessions without prompting from LRT  Short Term Goals: Increase self-esteem, Increase stress management skills  Treatment Modalities: Group Therapy and Individual Treatment Sessions  Therapeutic Interventions: Psychoeducation  Evaluation of Outcomes: Progressing   Progress in Treatment: Attending groups: Yes. Participating in groups: Yes. Taking medication as prescribed: Yes. Toleration medication: Yes. Family/Significant other contact made: Yes, individual(s) contacted:  sister Patient understands diagnosis: Yes. Discussing patient identified problems/goals with staff: Yes. Medical problems stabilized or resolved: Yes. Denies suicidal/homicidal ideation: Yes. Issues/concerns per patient self-inventory: No. Other: n/a  New problem(s) identified: Patient no longer has Medicaid insurance.   New Short Term/Long Term Goal(s):  Patient will develop new coping strategies to manage his stress before discharge.   Discharge Plan or Barriers: CSW still assessing appropriate discharge plan. Patient recently lost his Medicaid.   Reason for Continuation of Hospitalization: Anxiety Depression Medication stabilization  Estimated Length of Stay: 7 days.   Attendees: Patient: 06/30/2016 2:28 PM  Physician: Dr. Merlyn AlbertPatsey Berthold, MD 06/30/2016 2:28 PM  Nursing: Elige Radon, RN 06/30/2016 2:28 PM  RN Care Manager: 06/30/2016 2:28 PM  Social Worker: Lear Ng. Claybon Jabs MSW, [place] 06/30/2016 2:28 PM  Recreational Therapist: Leonette Monarch, LRT/CTRS 06/30/2016 2:28 PM  Other:  06/30/2016 2:28 PM  Other:  06/30/2016 2:28 PM  Other: 06/30/2016 2:28 PM  Scribe for Treatment Team: Calvert Cantor 06/30/2016 2:31 PM

## 2016-06-30 NOTE — Plan of Care (Signed)
Problem: Coping: Goal: Ability to verbalize feelings will improve Outcome: Progressing Patient verbalized feelings to staff.    

## 2016-07-01 NOTE — Progress Notes (Signed)
Rates depression, anxiety and hopelessness as 8/10.  Denies SI/HI/AV.  Minimal interaction noted with peers.  Continues to verbalize anxiety and that does not know how to get on top of it.  Medication and group compliant.   Support offered.  Safety checks maintained.

## 2016-07-01 NOTE — [facility] Group Notes (Signed)
[facility] Group Notes:  (Nursing/MHT/Case Management/Adjunct)  Date:  07/01/2016  Time:  1:00 AM  Type of Therapy:  Psychoeducational Skills  Participation Level:  Minimal  Participation Quality:  Sharing and Supportive  Affect:  Appropriate  Cognitive:  Appropriate  Insight:  Good  Engagement in Group:  Engaged  Modes of Intervention:  Discussion and Exploration  Summary of Progress/Problems:  Rudi Bunyard R Mikel Pyon 07/01/2016, 1:00 AM

## 2016-07-01 NOTE — [facility] Group Notes (Signed)
[facility] LCSW Group Therapy  07/01/2016 2:43 PM  Type of Therapy:  Group Therapy  Participation Level:  Minimal  Participation Quality:  Attentive  Affect:  Appropriate  Cognitive:  Alert  Insight:  Limited  Engagement in Therapy:  Limited  Modes of Intervention:  Activity, Clarification, Discussion, Education, Problem-solving, Art therapist, Socialization and Support  Summary of Progress/Problems: Coping Skills: Patients defined and discussed healthy coping skills. Patients identified healthy coping skills they would like to try during hospitalization and after discharge. CSW offered insight to varying coping skills that may have been new to patients such as practicing mindfulness. Patient stated his sleep and mood has improved. Patient stated he was still experiencing anxiety. CSW provided support to patient and discussed relaxation techniques. Patient participated in the mindfulness meditation exercise.    Lindsey Hommel G. [place], [place] 07/01/2016, 2:45 PM

## 2016-07-01 NOTE — Progress Notes (Signed)
Denies SI/HI/AVH. Endorses Depression. Paces unit. Does not initiate conversation. Attended evening group. Minimal participation. Denies pain. Voices no additional concerns at this time.Safety maintained. Will continue to monitor.

## 2016-07-01 NOTE — Progress Notes (Signed)
[facility] MD Progress Note  07/01/2016 2:15 PM Michael Schmitt  MRN:  [id number] Subjective:  Patient is a 46 year old widowed Caucasian male from [place]. Patient has history of major depressive disorder and alcohol use disorder.  Last year the patient was charged with 2 DWIs and also with disorderly conduct. He is awaiting court hearing for those charges this month. He says he has been unable to deal with the anxiety related to possible outcome that he is facing which is going to jail.  Patient was able to stop drinking in February and his last drink was on February 16. Patient has been under psychiatric care for depression and anxiety for many years. He was mainly treated with high doses of clonazepam  Patient came to the emergency department voluntarily on March 30. He was evaluated by a [facility] and discharged from the emergency department. He came back again on March 31 and was once again discharged from the emergency department. Patient came back to the emergency department on April 1.  All his complaints were worsening depression and anxiety. During his last ER visit he complained about also having suicidal ideation and wanting some help.  Patient was here in our unit from March 2 to March 9. Immediately after discharge on March 10 he came to the emergency department twice. He had a third visit to the emergency department on March 17. On March 18 he went to [facility] and from there he was transferred to [facility]. He was just discharged from there on March 28 (9 day stay).  Patient complains that his anxiety is severe. He feels almost "drunk". Seroquel, Vistaril are not working. He was started on Neurontin and he has not felt any benefit from it. He is afraid of taking it as well because his father and sister were on Neurontin and had suicidality.  06/26/16 the patient denies having suicidal thoughts but does feel like life is never going to get better. He denies auditory or visual  hallucinations. He complains of having poor concentration, poor appetite poor energy, severe anxiety and severe depression.  06/27/16 patient has been to several staff members that he should not be discharged because he still suicidal. He was stating he just did that if he were to be discharged today and that he was going to kill himself. The patient has been pacing around the unit. He appears to be severely anxious and has no ability to cope with stress. Patient says that the medications are not helping. He feels that a lot of the medications he has tried over the years has really help other than clonazepam.  Patient denies any side effects. As far as physical complaints he continues to complain about chronic neck and shoulder pain.  06/28/16 patient reports sleeping a little better last night however he continued to have multiple awakenings throughout the night. He has been able to attend groups and feels that he is benefited from the material his learning. He is still having a very difficult time relaxing. He is pacing the hallways and hyperventilating all day. He does not feel the medications have been of much benefit. Continues to have passive suicidal ideation. Denies homicidal ideation or auditory or visual hallucinations. Denies side effects from medications or major physical complaints other than chronic pain.  06/29/2016 Pt has been attending and benefiting from group sessions. He does not feel better, continues to feel anxious and depressed. The only improvement he is able to verbalize is with his sleep.  He states that the doxepin is helping with sleep but nothing is helping with his anxiety or depression. He complains of severe neck and shoulder pain. Says the pain makes him feel drunk.  06/30/16 much change. He does appear to be more relaxed but he does not report significant improvement. He said that with with the doxepin he wasn't sleeping okay up until last night. He had multiple awakenings  overnight. He also reports some discomfort on his legs. Tells me his being diagnosed with restless legs in the past. He still feels depressed, very anxious, and his pain aggravates his mood and anxiety. He is still her poor hopelessness and helplessness. Fear of facing legal at the end of the month.  Follow-up today Saturday the seventh. Patient is neatly dressed and groomed. Wide-awake. Affect a little blunted. Says he is feeling a little better not actively suicidal. Still seems very passive but says he is going to consider the possibility of discharge at the end of the weekend. No other specific new complaints  Per nursing:  D: Pt denies SI/HI/AVH. Pt is pleasant and cooperative, affect is flat and sad. Pt thoughts are organized, he appears less anxious and he is interacting with peers and staff appropriately.  A: Pt was offered support and encouragement. Pt was given scheduled medications. Pt was encouraged to attend groups. Q 15 minute checks were done for safety.  R:Pt attends groups and interacts well with peers and staff. Pt is complaint with medication. Pt has no complaints.Pt receptive to treatment and safety maintained on unit.  Calm on unit, compliant with care. Reports "some" depression and anxiety but verbally contracts for safety. Denies SI.HI.AVH. Visible on unit without incident. Will continue to monitor and provide therapeutic interventions as needed.   Principal Problem: Major depressive disorder, recurrent episode, moderate with anxious distress ([place]) Diagnosis:   Patient Active Problem List   Diagnosis Date Noted  . HTN (hypertension) [I10] 06/26/2016  . GERD (gastroesophageal reflux disease) [K21.9] 06/26/2016  . Alcohol use disorder, severe, 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[place]) [F10.21] 06/26/2016  . Cellulitis [L03.90] 06/26/2016  . Phencyclidine (PCP) use disorder, severe, dependence ([place]) [F16.20] 05/28/2016  . Sedative, hypnotic or anxiolytic use disorder, severe, dependence ([place])  [F13.20] 05/12/2016  . Ventricular fibrillation ([place]) [I49.01] 04/05/2016  . 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 History of ST elevation myocardial infarction (STEMI) [I25.2] 01/05/2016  . Major depressive disorder, recurrent episode, moderate with anxious distress ([place]) [F33.1] 06/21/2015  . Cervical spondylosis without myelopathy [M47.812] 07/24/2014  . Tobacco use disorder [F17.200] 10/20/2010  . Asthma [J45.909] 10/20/2010   Total Time spent with patient: 30 minutes  Past Psychiatric History: Patient reports that he has had at least 4 hospitalizations in his life, so basically most of them had happened this year as a result of the charges he received last year.  He has been a patient of Dr. suicidal at [facility] in [place] for more than 14 years. He was treated with high-dose clonazepam. High-dose clonazepam was discontinued several months ago as the patient was also drinking.  Denies any history of suicidal attempts  Past Medical History:  Past Medical History:  Diagnosis Date  . Anxiety   . Anxiety   . Arthritis    cerv. stenosis, spondylosis, HNP- lower back , has been followed in pain clinic, has  had injection s in cerv. area  . Blood dyscrasia    told that when he was young he was a" free bleeder"  . CAD (coronary artery disease)   .  Cervical spondylosis without myelopathy 07/24/2014  . Cervicogenic headache 07/24/2014  . Chronic kidney disease    renal calculi- passed spontaneously  . Depression   . Diabetes mellitus without complication ([place])   . Fatty liver   . GERD (gastroesophageal reflux disease)   . Headache(784.0)   . Hyperlipidemia   . Hypertension   . Mental disorder   . MI, old   . RLS (restless legs syndrome)    detected on sleep study  . Shortness of breath   . Ventricular fibrillation ([place]) 04/05/2016    Past Surgical History:  Procedure Laterality Date  . ANTERIOR CERVICAL DECOMP/DISCECTOMY FUSION  11/13/2011   Procedure: ANTERIOR CERVICAL  DECOMPRESSION/DISCECTOMY FUSION 1 LEVEL;  Surgeon: [name], MD;  Location: [facility];  Service: Neurosurgery;  Laterality: N/A;  Anterior Cervical Decompression/discectomy Fusion. Cervical three-four.  Marland Kitchen CARDIAC CATHETERIZATION N/A 01/01/2015   Procedure: Left Heart Cath and Coronary Angiography;  Surgeon: [name], MD;  Location: [place] CV LAB;  Service: Cardiovascular;  Laterality: N/A;  . CARDIAC CATHETERIZATION N/A 04/05/2016   Procedure: Left Heart Cath and Coronary Angiography;  Surgeon: Belva Crome, MD;  Location: [place] CV LAB;  Service: Cardiovascular;  Laterality: N/A;  . CARDIAC CATHETERIZATION N/A 04/05/2016   Procedure: Coronary Stent Intervention;  Surgeon: Belva Crome, MD;  Location: [place] CV LAB;  Service: Cardiovascular;  Laterality: N/A;  . CARDIAC CATHETERIZATION N/A 04/05/2016   Procedure: Intravascular Ultrasound/IVUS;  Surgeon: Belva Crome, MD;  Location: [place] CV LAB;  Service: Cardiovascular;  Laterality: N/A;  . NASAL SINUS SURGERY     2005   Family History:  Family History  Problem Relation Age of Onset  . Prostate cancer Father   . Hypertension Mother   . Kidney Stones Mother   . Anxiety disorder Mother   . Depression Mother   . COPD Sister   . Hypertension Sister   . Diabetes Sister   . Depression Sister   . Anxiety disorder Sister   . Seizures Sister    Family Psychiatric  History: Denies  Social History: Patient has been disabled for years after having an injury in his neck. 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 He lives with his mother, sister and the patient's 2 children who are 44 and 63 years old. The patient's first wife died many years ago years ago. He had a long relationship after that but never got married. He had 2 children with this partner.  History  Alcohol Use  . 0.0 oz/week    Comment: trying not to drink      History  Drug Use No    Social History   Social History  . Marital status:  Widowed    Spouse name: N/A  . Number of children: 3  . Years of education: 12   Occupational History  .      unemployed   Social History Main Topics  . Smoking status: Current Every Day Smoker    Packs/day: 2.00    Years: 17.00    Types: Cigarettes, Cigars  . Smokeless tobacco: Former Systems developer     Comment: occassional snuff   . Alcohol use 0.0 oz/week     Comment: trying not to drink   . Drug use: No  . Sexual activity: Not Currently   Other Topics Concern  . None   Social History Narrative   Patient is right handed.   Patient drinks 2 sodas daily.  Current Medications: Current Facility-Administered Medications  Medication Dose Route Frequency Provider Last Rate Last Dose  . acetaminophen (TYLENOL) tablet 1,000 mg  1,000 mg Oral BID Hildred Priest, MD   1,000 mg at 07/01/16 0834  . alum & mag hydroxide-simeth (MAALOX/MYLANTA) 200-200-20 MG/5ML suspension 30 mL  30 mL Oral Q4H PRN Jolanta B Pucilowska, MD      . aspirin EC tablet 81 mg  81 mg Oral Daily Hildred Priest, MD   81 mg at 07/01/16 3790  . atorvastatin (LIPITOR) tablet 80 mg  80 mg Oral QPM Hildred Priest, MD   80 mg at 06/30/16 1730  . carvedilol (COREG) tablet 6.25 mg  6.25 mg Oral BID Hildred Priest, MD   6.25 mg at 07/01/16 2409  . cephALEXin (KEFLEX) capsule 500 mg  500 mg Oral Q8H Hildred Priest, MD   500 mg at 07/01/16 0650  . cyclobenzaprine (FLEXERIL) tablet 7.5 mg  7.5 mg Oral TID Hildred Priest, MD   7.5 mg at 07/01/16 1251  . doxepin (SINEQUAN) capsule 100 mg  100 mg Oral QHS Hildred Priest, MD   100 mg at 06/30/16 2138  . lidocaine (LIDODERM) 5 % 1 patch  1 patch Transdermal Q24H Hildred Priest, MD   1 patch at 06/30/16 1233  . loperamide (IMODIUM) capsule 4 mg  4 mg Oral PRN Hildred Priest, MD      . magnesium hydroxide (MILK OF MAGNESIA) suspension 30 mL  30 mL Oral Daily PRN Jolanta B Pucilowska, MD       . meloxicam (MOBIC) tablet 7.5 mg  7.5 mg Oral BID Hildred Priest, MD   7.5 mg at 07/01/16 0834  . nicotine (NICODERM CQ - dosed in mg/24 hours) patch 21 mg  21 mg Transdermal Q0600 Clovis Fredrickson, MD   21 mg at 06/30/16 7353  . nitroGLYCERIN (NITROSTAT) SL tablet 0.4 mg  0.4 mg Sublingual Q5 Min x 3 PRN Hildred Priest, MD      . risperiDONE (RISPERDAL) tablet 0.5 mg  0.5 mg Oral BID Hildred Priest, MD   0.5 mg at 07/01/16 1251  . rOPINIRole (REQUIP) tablet 0.25 mg  0.25 mg Oral QHS Hildred Priest, MD   0.25 mg at 06/30/16 2138  . ticagrelor (BRILINTA) tablet 90 mg  90 mg Oral BID Hildred Priest, MD   90 mg at 07/01/16 2992    Lab Results: No results found for this or any previous visit (from the past 48 hour(s)).  Blood Alcohol level:  Lab Results  Component Value Date   ETH <5 06/24/2016   ETH <5 42/68/3419    Metabolic Disorder Labs: Lab Results  Component Value Date   HGBA1C 5.6 05/27/2016   MPG 114 05/27/2016   MPG 123 04/05/2016   Lab Results  Component Value Date   PROLACTIN 6.5 05/27/2016   Lab Results  Component Value Date   CHOL 217 (H) 05/27/2016   TRIG 334 (H) 05/27/2016   HDL 32 (L) 05/27/2016   CHOLHDL 6.8 05/27/2016   VLDL 67 (H) 05/27/2016   LDLCALC 118 (H) 05/27/2016   [place] UNABLE TO REPORT DUE TO LIPEMIC INTERFERENCE 04/05/2016    Physical Findings: AIMS:  , ,  ,  ,    CIWA:    COWS:     Musculoskeletal: Strength & Muscle Tone: within normal limits Gait & Station: normal Patient leans: N/A  Psychiatric Specialty Exam: Physical Exam  Nursing note and vitals reviewed. Constitutional: He is oriented to person, place, and time. He appears  well-developed and well-nourished.  HENT:  Head: Normocephalic and atraumatic.  Eyes: EOM are normal.  Cardiovascular: Regular rhythm.   Respiratory: Effort normal. No respiratory distress.  Neurological: He is alert and oriented to person,  place, and time.  Psychiatric: His speech is normal. His affect is blunt. He is slowed. Cognition and memory are normal. He expresses inappropriate judgment. He expresses no suicidal ideation.    Review of Systems  Constitutional: Negative.   HENT: Negative.   Eyes: Negative.   Respiratory: Negative.   Cardiovascular: Negative.   Gastrointestinal: Negative.   Genitourinary: Negative.   Musculoskeletal: Positive for joint pain and neck pain.  Skin: Negative.   Neurological: Negative.   Endo/Heme/Allergies: Negative.   Psychiatric/Behavioral: Positive for depression and substance abuse. Negative for hallucinations, memory loss and suicidal ideas. The patient is nervous/anxious and has insomnia.     Blood pressure (!) 151/96, pulse 95, temperature 98 F (36.7 C), temperature source Oral, resp. rate 18, height 6\' 2"  (1.88 m), weight 90.7 kg (200 lb), SpO2 98 %.Body mass index is 25.68 kg/m.  General Appearance: Well Groomed  Eye Contact:  Good  Speech:  Clear and Coherent  Volume:  Normal  Mood:  Anxious and Dysphoric  Affect:  Congruent  Thought Process:  Linear and Descriptions of Associations: Intact  Orientation:  Full (Time, Place, and Person)  Thought Content:  Hallucinations: None  Suicidal Thoughts:  passive SI  Homicidal Thoughts:  No  Memory:  Immediate;   Fair Recent;   Fair Remote;   Fair  Judgement:  Poor  Insight:  Shallow  Psychomotor Activity:  Increased  Concentration:  Concentration: Poor and Attention Span: Poor  Recall:  [employer] of Knowledge:  Fair  Language:  Fair  Akathisia:  No  Handed:    AIMS (if indicated):     Assets:  Agricultural consultant Housing Social Support  ADL's:  Intact  Cognition:  WNL  Sleep:  Number of Hours: 7.15     Treatment Plan Summary: Daily contact with patient to assess and evaluate symptoms and progress in treatment and Medication management   Patient is a 46 year old with depression,  chronic anxiety and substance abuse issues. The patient is facing legal charges this month, discharges are related to alcohol dependence. Patient is distressed about the possible outcome of having to go to jail. Patient believes his making jail time by staying in the hospital and seems to think that the judge will not making the stain jail because he has already stated in a lockdown facility   Major depressive disorder: Patient is still very anxious, affect is blunted, pacing the hallways.  The plan today will be to Continue Doxepin 100 mg qhs. Will d/c  luvox today  Patient likes doxepin and says that he had tried in the past with good response.  For anxiety the patient has failed multiple agents. He says that Seroquel, Neurontin, Vistaril have not helped at all. In preparation for d/c I will change haldol to risperdal as I'm concern he might take more than prescribe and have a dystonic reaction. Will start risperdal 0.5 mg po bid  For insomnia and chronic pain: continue doxepin which will be increased to 100 mg  Cellulitis: Ingrown toenail. Continue  Keflex 500 mg every 8 hours until Tuesday 10  Alcohol use disorder patient has been in remission for about a month. He will benefit from intensive outpatient substance abuse  Chronic pain  increase Flexeril 7.5 mg tid and add  meloxican 7.5mg  bid.   Restless leg syndrome patient has been started on Requip 0.25 mg daily at bedtime  Diarrhea: likely due to anxiety. Continue  imodium prn  Tobacco use disorder: continue  nicotine patch 21 mg  HTN: continue coreg 6.5 mg bid  Dyslipidemia: Continue Lipitor 80 mg a day   Cardiovascular disease continue aspirin 81 mg a day and brilinta  Diet heart healthy  VS q shift  Hospitalization status voluntary  Precautions q 15 m  Dispo: back home  F/u: [facility] and also will refer to a day program. Case manager consulted for establishing primary care.  Plan to d/c home in the next 3  days  No change to medicine or treatment plan. Encourage group participation. Patient appears to of been attending groups and says he thinks he is getting a lot out of it. Feels good about having been able to be around people again. No other change to plan.  Alethia Berthold, MD 07/01/2016, 2:15 PM

## 2016-07-02 NOTE — Progress Notes (Signed)
Denies SI/HI/AVH. Endorses Depression. Paces unit. Does not initiate conversation. Attended evening group.  Denies pain. Voices no additional concerns at this time.Safety maintained. Will continue to monitor.

## 2016-07-02 NOTE — Progress Notes (Signed)
Denies SI/HI/AVH.  Continues to have anxiety and depression.  [place] halls.  Will engage if approached, no initiation.  Support and encouragement offered.  Safety checks maintained.

## 2016-07-02 NOTE — Plan of Care (Signed)
Problem: Coping: Goal: Ability to cope will improve Outcome: Progressing Pt states that nothing is working. Continues to pace unit and endorse anxiety when staff attempts to discuss coping skills. Pt fixated on court date coming up.

## 2016-07-02 NOTE — [facility] Group Notes (Signed)
April 10/2016 100 PM  Type of Therapy:  Group Therapy Emotional Regulation Participation Level:  Active  Participation Quality:  Attentive  Affect:  Appropriate  Cognitive:  Appropriate  Insight:  Engaged  Engagement in Therapy:  Developing/Improving  Modes of Intervention:  Activity, Clarification, Discussion, Education, Exploration and Socialization   The purpose of this group is to assist patients in learning to regulate negative emotions and experience positive emotions.  Patients will be guided to discuss ways in which they have been vulnerable to their negative emotions.  These vulnerabilities will be juxtaposed with experiences of positive emotions or situations, and  patients challenged to use positive emotions to combat negative ones.  Special emphasis will be placed on coping with negative emotions in conflict situations,  and patients will process healthy conflict resolution skills.    Therapeutic Goals:   1. Patient will identify two positive emotions or experiences to reflect on in order to balance out negative emotions:    2. Patient will label two or more emotions that they find the most difficult to experience:    3. Patient will be able to demonstrate positive conflict resolution skills through discussion or role plays:    Summary of Progress/Problems: The topic for today was Emotional Regulation Peers were asked to reflect on 2 incidents when they lives were unbalanced.  With facilitation from  group Leader those situations were reflected by patient and their peers.   Pt discussed coping skills that can be used for regulating emotions This patient was able to be supportive and this patients feelings was supported by this worker and peers Patient were then to review ways to improve thier emotions and to share options that assisted them in reducing thier own emotional disregulation. Patient was an active listener and participant in the group discussion  today ( taking medications, keep doctors appointments, create a daily routine,excercize, volunteer, call friend/family and several stress reduction techniques were reviewed.)    Michael Schmitt April 8,2018 2:30pm

## 2016-07-02 NOTE — Progress Notes (Signed)
[facility] MD Progress Note  07/02/2016 3:59 PM Michael Schmitt  MRN:  [id number] Subjective:  Patient is a 46 year old widowed Caucasian male from [place]. Patient has history of major depressive disorder and alcohol use disorder.  Last year the patient was charged with 2 DWIs and also with disorderly conduct. He is awaiting court hearing for those charges this month. He says he has been unable to deal with the anxiety related to possible outcome that he is facing which is going to jail.  Patient was able to stop drinking in February and his last drink was on February 16. Patient has been under psychiatric care for depression and anxiety for many years. He was mainly treated with high doses of clonazepam  Patient came to the emergency department voluntarily on March 30. He was evaluated by a [facility] and discharged from the emergency department. He came back again on March 31 and was once again discharged from the emergency department. Patient came back to the emergency department on April 1.  All his complaints were worsening depression and anxiety. During his last ER visit he complained about also having suicidal ideation and wanting some help.  Patient was here in our unit from March 2 to March 9. Immediately after discharge on March 10 he came to the emergency department twice. He had a third visit to the emergency department on March 17. On March 18 he went to [facility] and from there he was transferred to [facility]. He was just discharged from there on March 28 (9 day stay).  Patient complains that his anxiety is severe. He feels almost "drunk". Seroquel, Vistaril are not working. He was started on Neurontin and he has not felt any benefit from it. He is afraid of taking it as well because his father and sister were on Neurontin and had suicidality.  06/26/16 the patient denies having suicidal thoughts but does feel like life is never going to get better. He denies auditory or visual  hallucinations. He complains of having poor concentration, poor appetite poor energy, severe anxiety and severe depression.  06/27/16 patient has been to several staff members that he should not be discharged because he still suicidal. He was stating he just did that if he were to be discharged today and that he was going to kill himself. The patient has been pacing around the unit. He appears to be severely anxious and has no ability to cope with stress. Patient says that the medications are not helping. He feels that a lot of the medications he has tried over the years has really help other than clonazepam.  Patient denies any side effects. As far as physical complaints he continues to complain about chronic neck and shoulder pain.  06/28/16 patient reports sleeping a little better last night however he continued to have multiple awakenings throughout the night. He has been able to attend groups and feels that he is benefited from the material his learning. He is still having a very difficult time relaxing. He is pacing the hallways and hyperventilating all day. He does not feel the medications have been of much benefit. Continues to have passive suicidal ideation. Denies homicidal ideation or auditory or visual hallucinations. Denies side effects from medications or major physical complaints other than chronic pain.  06/29/2016 Pt has been attending and benefiting from group sessions. He does not feel better, continues to feel anxious and depressed. The only improvement he is able to verbalize is with his sleep.  He states that the doxepin is helping with sleep but nothing is helping with his anxiety or depression. He complains of severe neck and shoulder pain. Says the pain makes him feel drunk.  06/30/16 much change. He does appear to be more relaxed but he does not report significant improvement. He said that with with the doxepin he wasn't sleeping okay up until last night. He had multiple awakenings  overnight. He also reports some discomfort on his legs. Tells me his being diagnosed with restless legs in the past. He still feels depressed, very anxious, and his pain aggravates his mood and anxiety. He is still her poor hopelessness and helplessness. Fear of facing legal at the end of the month.  Follow-up today Saturday the seventh. Patient is neatly dressed and groomed. Wide-awake. Affect a little blunted. Says he is feeling a little better not actively suicidal. Still seems very passive but says he is going to consider the possibility of discharge at the end of the weekend. No other specific new complaints  Follow-up Sunday the eighth. No new complaints. Interacting well with peers. Denies suicidal or homicidal thoughts. Feels more optimistic. Tolerating medicine well.  Per nursing:  D: Pt denies SI/HI/AVH. Pt is pleasant and cooperative, affect is flat and sad. Pt thoughts are organized, he appears less anxious and he is interacting with peers and staff appropriately.  A: Pt was offered support and encouragement. Pt was given scheduled medications. Pt was encouraged to attend groups. Q 15 minute checks were done for safety.  R:Pt attends groups and interacts well with peers and staff. Pt is complaint with medication. Pt has no complaints.Pt receptive to treatment and safety maintained on unit.  Calm on unit, compliant with care. Reports "some" depression and anxiety but verbally contracts for safety. Denies SI.HI.AVH. Visible on unit without incident. Will continue to monitor and provide therapeutic interventions as needed.   Principal Problem: Major depressive disorder, recurrent episode, moderate with anxious distress ([place]) Diagnosis:   Patient Active Problem List   Diagnosis Date Noted  . HTN (hypertension) [I10] 06/26/2016  . GERD (gastroesophageal reflux disease) [K21.9] 06/26/2016  . Alcohol use disorder, severe, 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[place]) [F10.21] 06/26/2016  . Cellulitis [L03.90]  06/26/2016  . Phencyclidine (PCP) use disorder, severe, dependence ([place]) [F16.20] 05/28/2016  . Sedative, hypnotic or anxiolytic use disorder, severe, dependence ([place]) [F13.20] 05/12/2016  . Ventricular fibrillation ([place]) [I49.01] 04/05/2016  . 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 History of ST elevation myocardial infarction (STEMI) [I25.2] 01/05/2016  . Major depressive disorder, recurrent episode, moderate with anxious distress ([place]) [F33.1] 06/21/2015  . Cervical spondylosis without myelopathy [M47.812] 07/24/2014  . Tobacco use disorder [F17.200] 10/20/2010  . Asthma [J45.909] 10/20/2010   Total Time spent with patient: 30 minutes  Past Psychiatric History: Patient reports that he has had at least 4 hospitalizations in his life, so basically most of them had happened this year as a result of the charges he received last year.  He has been a patient of Dr. suicidal at [facility] in [place] for more than 14 years. He was treated with high-dose clonazepam. High-dose clonazepam was discontinued several months ago as the patient was also drinking.  Denies any history of suicidal attempts  Past Medical History:  Past Medical History:  Diagnosis Date  . Anxiety   . Anxiety   . Arthritis    cerv. stenosis, spondylosis, HNP- lower back , has been followed in pain clinic, has  had injection s in cerv. area  .  Blood dyscrasia    told that when he was young he was a" free bleeder"  . CAD (coronary artery disease)   . Cervical spondylosis without myelopathy 07/24/2014  . Cervicogenic headache 07/24/2014  . Chronic kidney disease    renal calculi- passed spontaneously  . Depression   . Diabetes mellitus without complication ([place])   . Fatty liver   . GERD (gastroesophageal reflux disease)   . Headache(784.0)   . Hyperlipidemia   . Hypertension   . Mental disorder   . MI, old   . RLS (restless legs syndrome)    detected on sleep study  . Shortness of breath   . Ventricular  fibrillation ([place]) 04/05/2016    Past Surgical History:  Procedure Laterality Date  . ANTERIOR CERVICAL DECOMP/DISCECTOMY FUSION  11/13/2011   Procedure: ANTERIOR CERVICAL DECOMPRESSION/DISCECTOMY FUSION 1 LEVEL;  Surgeon: [name], MD;  Location: [place] NEURO ORS;  Service: Neurosurgery;  Laterality: N/A;  Anterior Cervical Decompression/discectomy Fusion. Cervical three-four.  Marland Kitchen CARDIAC CATHETERIZATION N/A 01/01/2015   Procedure: Left Heart Cath and Coronary Angiography;  Surgeon: [name], MD;  Location: [place] CV LAB;  Service: Cardiovascular;  Laterality: N/A;  . CARDIAC CATHETERIZATION N/A 04/05/2016   Procedure: Left Heart Cath and Coronary Angiography;  Surgeon: Belva Crome, MD;  Location: [place] CV LAB;  Service: Cardiovascular;  Laterality: N/A;  . CARDIAC CATHETERIZATION N/A 04/05/2016   Procedure: Coronary Stent Intervention;  Surgeon: Belva Crome, MD;  Location: [place] CV LAB;  Service: Cardiovascular;  Laterality: N/A;  . CARDIAC CATHETERIZATION N/A 04/05/2016   Procedure: Intravascular Ultrasound/IVUS;  Surgeon: Belva Crome, MD;  Location: [place] CV LAB;  Service: Cardiovascular;  Laterality: N/A;  . NASAL SINUS SURGERY     2005   Family History:  Family History  Problem Relation Age of Onset  . Prostate cancer Father   . Hypertension Mother   . Kidney Stones Mother   . Anxiety disorder Mother   . Depression Mother   . COPD Sister   . Hypertension Sister   . Diabetes Sister   . Depression Sister   . Anxiety disorder Sister   . Seizures Sister    Family Psychiatric  History: Denies  Social History: Patient has been disabled for years after having an injury in his neck. 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 He lives with his mother, sister and the patient's 2 children who are 36 and 69 years old. The patient's first wife died many years ago years ago. He had a long relationship after that but never got married. He had 2 children with  this partner.  History  Alcohol Use  . 0.0 oz/week    Comment: trying not to drink      History  Drug Use No    Social History   Social History  . Marital status: Widowed    Spouse name: N/A  . Number of children: 3  . Years of education: 12   Occupational History  .      unemployed   Social History Main Topics  . Smoking status: Current Every Day Smoker    Packs/day: 2.00    Years: 17.00    Types: Cigarettes, Cigars  . Smokeless tobacco: Former Systems developer     Comment: occassional snuff   . Alcohol use 0.0 oz/week     Comment: trying not to drink   . Drug use: No  . Sexual activity: Not Currently   Other Topics  Concern  . None   Social History Narrative   Patient is right handed.   Patient drinks 2 sodas daily.     Current Medications: Current Facility-Administered Medications  Medication Dose Route Frequency Provider Last Rate Last Dose  . acetaminophen (TYLENOL) tablet 1,000 mg  1,000 mg Oral BID Hildred Priest, MD   1,000 mg at 07/02/16 0715  . alum & mag hydroxide-simeth (MAALOX/MYLANTA) 200-200-20 MG/5ML suspension 30 mL  30 mL Oral Q4H PRN Jolanta B Pucilowska, MD      . aspirin EC tablet 81 mg  81 mg Oral Daily Hildred Priest, MD   81 mg at 07/02/16 0716  . atorvastatin (LIPITOR) tablet 80 mg  80 mg Oral QPM Hildred Priest, MD   80 mg at 07/01/16 1715  . carvedilol (COREG) tablet 6.25 mg  6.25 mg Oral BID Hildred Priest, MD   6.25 mg at 07/02/16 0715  . cephALEXin (KEFLEX) capsule 500 mg  500 mg Oral Q8H Hildred Priest, MD   500 mg at 07/02/16 1223  . cyclobenzaprine (FLEXERIL) tablet 7.5 mg  7.5 mg Oral TID Hildred Priest, MD   7.5 mg at 07/02/16 1223  . doxepin (SINEQUAN) capsule 100 mg  100 mg Oral QHS Lenis Noon, RPH   100 mg at 07/01/16 2118  . lidocaine (LIDODERM) 5 % 1 patch  1 patch Transdermal Q24H Hildred Priest, MD   1 patch at 06/30/16 1233  . loperamide (IMODIUM) capsule  4 mg  4 mg Oral PRN Hildred Priest, MD      . magnesium hydroxide (MILK OF MAGNESIA) suspension 30 mL  30 mL Oral Daily PRN Jolanta B Pucilowska, MD      . meloxicam (MOBIC) tablet 7.5 mg  7.5 mg Oral BID Hildred Priest, MD   7.5 mg at 07/02/16 0715  . nicotine (NICODERM CQ - dosed in mg/24 hours) patch 21 mg  21 mg Transdermal Q0600 Jolanta B Pucilowska, MD   21 mg at 07/02/16 0714  . nitroGLYCERIN (NITROSTAT) SL tablet 0.4 mg  0.4 mg Sublingual Q5 Min x 3 PRN Hildred Priest, MD      . risperiDONE (RISPERDAL) tablet 0.5 mg  0.5 mg Oral BID Hildred Priest, MD   0.5 mg at 07/02/16 1223  . rOPINIRole (REQUIP) tablet 0.25 mg  0.25 mg Oral QHS Hildred Priest, MD   0.25 mg at 07/01/16 2118  . ticagrelor (BRILINTA) tablet 90 mg  90 mg Oral BID Hildred Priest, MD   90 mg at 07/02/16 5427    Lab Results: No results found for this or any previous visit (from the past 48 hour(s)).  Blood Alcohol level:  Lab Results  Component Value Date   [facility] <5 06/24/2016   ETH <5 09/17/7626    Metabolic Disorder Labs: Lab Results  Component Value Date   HGBA1C 5.6 05/27/2016   MPG 114 05/27/2016   MPG 123 04/05/2016   Lab Results  Component Value Date   PROLACTIN 6.5 05/27/2016   Lab Results  Component Value Date   CHOL 217 (H) 05/27/2016   TRIG 334 (H) 05/27/2016   HDL 32 (L) 05/27/2016   CHOLHDL 6.8 05/27/2016   VLDL 67 (H) 05/27/2016   LDLCALC 118 (H) 05/27/2016   LDLCALC UNABLE TO REPORT DUE TO LIPEMIC INTERFERENCE 04/05/2016    Physical Findings: AIMS:  , ,  ,  ,    CIWA:    COWS:     Musculoskeletal: Strength & Muscle Tone: within normal limits Gait & Station:  normal Patient leans: N/A  Psychiatric Specialty Exam: Physical Exam  Nursing note and vitals reviewed. Constitutional: He is oriented to person, place, and time. He appears well-developed and well-nourished.  HENT:  Head: Normocephalic and atraumatic.   Eyes: EOM are normal.  Cardiovascular: Regular rhythm.   Respiratory: Effort normal. No respiratory distress.  Neurological: He is alert and oriented to person, place, and time.  Psychiatric: His speech is normal. His affect is blunt. He is slowed. Cognition and memory are normal. He does not express inappropriate judgment. He expresses no suicidal ideation.    Review of Systems  Constitutional: Negative.   HENT: Negative.   Eyes: Negative.   Respiratory: Negative.   Cardiovascular: Negative.   Gastrointestinal: Negative.   Genitourinary: Negative.   Musculoskeletal: Positive for joint pain and neck pain.  Skin: Negative.   Neurological: Negative.   Endo/Heme/Allergies: Negative.   Psychiatric/Behavioral: Positive for depression and substance abuse. Negative for hallucinations, memory loss and suicidal ideas. The patient is nervous/anxious and has insomnia.     Blood pressure (!) 144/86, pulse 73, temperature 97.7 F (36.5 C), temperature source Oral, resp. rate 18, height 6\' 2"  (1.88 m), weight 90.7 kg (200 lb), SpO2 98 %.Body mass index is 25.68 kg/m.  General Appearance: Well Groomed  Eye Contact:  Good  Speech:  Clear and Coherent  Volume:  Normal  Mood:  Anxious and Dysphoric  Affect:  Congruent  Thought Process:  Linear and Descriptions of Associations: Intact  Orientation:  Full (Time, Place, and Person)  Thought Content:  Hallucinations: None  Suicidal Thoughts:  passive SI  Homicidal Thoughts:  No  Memory:  Immediate;   Fair Recent;   Fair Remote;   Fair  Judgement:  Poor  Insight:  Shallow  Psychomotor Activity:  Increased  Concentration:  Concentration: Poor and Attention Span: Poor  Recall:  [employer] of Knowledge:  Fair  Language:  Fair  Akathisia:  No  Handed:    AIMS (if indicated):     Assets:  Agricultural consultant Housing Social Support  ADL's:  Intact  Cognition:  WNL  Sleep:  Number of Hours: 6.3      Treatment Plan Summary: Daily contact with patient to assess and evaluate symptoms and progress in treatment and Medication management   Patient is a 46 year old with depression, chronic anxiety and substance abuse issues. The patient is facing legal charges this month, discharges are related to alcohol dependence. Patient is distressed about the possible outcome of having to go to jail. Patient believes his making jail time by staying in the hospital and seems to think that the judge will not making the stain jail because he has already stated in a lockdown facility   Major depressive disorder: Patient is still very anxious, affect is blunted, pacing the hallways.  The plan today will be to Continue Doxepin 100 mg qhs. Will d/c  luvox today  Patient likes doxepin and says that he had tried in the past with good response.  For anxiety the patient has failed multiple agents. He says that Seroquel, Neurontin, Vistaril have not helped at all. In preparation for d/c I will change haldol to risperdal as I'm concern he might take more than prescribe and have a dystonic reaction. Will start risperdal 0.5 mg po bid  For insomnia and chronic pain: continue doxepin which will be increased to 100 mg  Cellulitis: Ingrown toenail. Continue  Keflex 500 mg every 8 hours until Tuesday 10  Alcohol use  disorder patient has been in remission for about a month. He will benefit from intensive outpatient substance abuse  Chronic pain  increase Flexeril 7.5 mg tid and add meloxican 7.5mg  bid.   Restless leg syndrome patient has been started on Requip 0.25 mg daily at bedtime  Diarrhea: likely due to anxiety. Continue  imodium prn  Tobacco use disorder: continue  nicotine patch 21 mg  HTN: continue coreg 6.5 mg bid  Dyslipidemia: Continue Lipitor 80 mg a day   Cardiovascular disease continue aspirin 81 mg a day and brilinta  Diet heart healthy  VS q shift  Hospitalization status  voluntary  Precautions q 15 m  Dispo: back home  F/u: CBC and also will refer to a day program. Case manager consulted for establishing primary care.  Plan to d/c home in the next 3 days  No change to medicine or treatment plan. Encourage group participation. Patient appears to of been attending groups and says he thinks he is getting a lot out of it. Feels good about having been able to be around people again. No other change to plan. No change to treatment as of Sunday. Supportive counseling. Encourage discussion with treatment team about discharge planning.  07/02/2016, 3:59 PM

## 2016-07-02 NOTE — BHH Group Notes (Signed)
[place] Group Notes:  (Nursing/MHT/Case Management/Adjunct)  Date:  07/02/2016  Time:  9:37 PM  Type of Therapy:  Evening Wrap-up Group  Participation Level:  Active  Participation Quality:  Appropriate and Attentive  Affect:  Appropriate  Cognitive:  Alert and Appropriate  Insight:  Appropriate and Good  Engagement in Group:  Developing/Improving and Engaged  Modes of Intervention:  Discussion  Summary of Progress/Problems:  Levonne Spiller 07/02/2016, 9:37 PM

## 2016-07-02 NOTE — Progress Notes (Signed)
Denies SI/HI/AVH. Endorses Depression and anxiety. Paces unit. Does not initiate conversation. Attended evening group. Minimal participation. Denies pain. Voices no additional concerns at this time.Safety maintained. Will continue to monitor.

## 2016-07-02 NOTE — BHH Group Notes (Signed)
[place] Group Notes:  (Nursing/MHT/Case Management/Adjunct)  Date:  07/02/2016  Time:  3:36 AM  Type of Therapy:  Psychoeducational Skills  Participation Level:  Did Not Attend  Summary of Progress/Problems:  Michael Schmitt 07/02/2016, 3:36 AM

## 2016-07-03 MED ORDER — MELOXICAM 7.5 MG PO TABS: 7.5000 mg | ORAL_TABLET | Freq: Two times a day (BID) | ORAL | 0 refills | Status: DC

## 2016-07-03 MED ORDER — ROPINIROLE HCL 0.25 MG PO TABS: 0.2500 mg | ORAL_TABLET | Freq: Every day | ORAL | 0 refills | Status: DC

## 2016-07-03 MED ORDER — CEPHALEXIN 500 MG PO CAPS: 500.0000 mg | ORAL_CAPSULE | Freq: Three times a day (TID) | ORAL | 0 refills | Status: DC

## 2016-07-03 MED ORDER — DOXEPIN HCL 150 MG PO CAPS: 150.0000 mg | ORAL_CAPSULE | Freq: Every day | ORAL | 0 refills | Status: DC

## 2016-07-03 MED ORDER — CYCLOBENZAPRINE HCL 7.5 MG PO TABS: 7.5000 mg | ORAL_TABLET | Freq: Three times a day (TID) | ORAL | 0 refills | Status: DC

## 2016-07-03 MED ORDER — RISPERIDONE 0.5 MG PO TABS
0.5000 mg | ORAL_TABLET | Freq: Two times a day (BID) | ORAL | 0 refills | Status: DC
Start: 2016-07-03 — End: 2016-08-03

## 2016-07-03 NOTE — BHH Suicide Risk Assessment (Signed)
[facility] Discharge Suicide Risk Assessment   Principal Problem: Major depressive disorder, recurrent episode, moderate with anxious distress [facility]) Discharge Diagnoses:  Patient Active Problem List   Diagnosis Date Noted  . HTN (hypertension) [I10] 06/26/2016  . GERD (gastroesophageal reflux disease) [K21.9] 06/26/2016  . Alcohol use disorder, severe, 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[place]) [F10.21] 06/26/2016  . Cellulitis [L03.90] 06/26/2016  . Phencyclidine (PCP) use disorder, severe, dependence ([place]) [F16.20] 05/28/2016  . Sedative, hypnotic or anxiolytic use disorder, severe, dependence ([place]) [F13.20] 05/12/2016  . Ventricular fibrillation ([place]) [I49.01] 04/05/2016  . 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 History of ST elevation myocardial infarction (STEMI) [I25.2] 01/05/2016  . Major depressive disorder, recurrent episode, moderate with anxious distress ([place]) [F33.1] 06/21/2015  . Cervical spondylosis without myelopathy [M47.812] 07/24/2014  . Tobacco use disorder [F17.200] 10/20/2010  . Asthma [J45.909] 10/20/2010     Psychiatric Specialty Exam: ROS  Blood pressure 138/84, pulse 74, temperature 98.2 F (36.8 C), temperature source Oral, resp. rate 18, height 6\' 2"  (1.88 m), weight 90.7 kg (200 lb), SpO2 98 %.Body mass index is 25.68 kg/m.                                                       Mental Status Per Nursing Assessment::   On Admission:  Suicidal ideation indicated by patient, Self-harm thoughts  Demographic Factors:  Male, Divorced or widowed and Caucasian  Loss Factors: Legal issues  Historical Factors: Impulsivity  Risk Reduction Factors:   Responsible for children under 85 years of age, Sense of responsibility to family, Religious beliefs about death, Living with another person, especially a relative and Positive social support  Continued Clinical Symptoms:  Alcohol/Substance Abuse/Dependencies Chronic Pain More than one psychiatric  diagnosis Previous Psychiatric Diagnoses and Treatments  Cognitive Features That Contribute To Risk:  Polarized thinking    Suicide Risk:  Minimal: No identifiable suicidal ideation.  Patients presenting with no risk factors but with morbid ruminations; may be classified as minimal risk based on the severity of the depressive symptoms   Hildred Priest, MD 07/03/2016, 9:49 AM

## 2016-07-03 NOTE — Progress Notes (Signed)
Recreation Therapy Notes  INPATIENT RECREATION TR PLAN  Patient Details Name: Michael Schmitt MRN: [id number] DOB: 1970/10/30 Today's Date: 07/03/2016  Rec Therapy Plan Is patient appropriate for Therapeutic Recreation?: Yes Treatment times per week: At least once a week TR Treatment/Interventions: 1:1 session, Group participation (Comment) (Appropriate participation in daily recreational therapy tx)  Discharge Criteria Pt will be discharged from therapy if:: Treatment goals are met, Discharged Treatment plan/goals/alternatives discussed and agreed upon by:: Patient/family  Discharge Summary Short term goals set: See Care Plan Short term goals met: Adequate for discharge Progress toward goals comments: One-to-one attended Which groups?: Wellness, Social skills, Leisure education, Self-esteem, Other (Comment) (Self-expression) One-to-one attended: Self-esteem, stress management Reason goals not met: Patient d/c before goals could be met Therapeutic equipment acquired: None Reason patient discharged from therapy: Discharge from hospital Pt/family agrees with progress & goals achieved: Yes Date patient discharged from therapy: 07/03/16   Leonette Monarch, LRT/CTRS 07/03/2016, 2:56 PM

## 2016-07-03 NOTE — BHH Group Notes (Signed)
[place] LCSW Group Therapy   07/03/2016 9:30 am Type of Therapy: Group Therapy   Participation Level: Active   Participation Quality: Attentive, Sharing and Supportive   Affect: Appropriate   Cognitive: Alert and Oriented   Insight: Developing/Improving and Engaged   Engagement in Therapy: Developing/Improving and Engaged   Modes of Intervention: Clarification, Confrontation, Discussion, Education, Exploration,  Limit-setting, Orientation, Problem-solving, Rapport Building, Art therapist, Socialization and Support   Summary of Progress/Problems: Pt identified obstacles faced currently and processed barriers involved in overcoming these obstacles. Pt identified steps necessary for overcoming these obstacles and explored motivation (internal and external) for facing these difficulties head on. Pt further identified one area of concern in their lives and chose a goal to focus on for today.   Glorious Peach, MSW, LCSWA 07/03/2016, 1:27PM

## 2016-07-03 NOTE — Progress Notes (Addendum)
Attempts made to arrange day treatment/[facility] for pt through PSI failed due to issues with pt medicaid/[employer] Medicare.  Pt referred back to [place] and Dr. Kasandra Knudsen.  [place] meeting schedule also given to pt. [place] unwilling to schedule pt for therapy prior to his meeting with Dr Kasandra Knudsen. Winferd Humphrey, MSW, LCSW Clinical Social Worker 07/03/2016 10:47 AM

## 2016-07-03 NOTE — Discharge Summary (Signed)
Physician Discharge Summary Note  Patient:  Michael Schmitt is an 46 y.o., male MRN:  [id number] DOB:  Jan 28, 1971 Patient phone:  [phone] (home)  Patient address:   [address] [place] [address],  Total Time spent with patient: 30 minutes  Date of Admission:  06/25/2016 Date of Discharge: 07/03/16  Reason for Admission:  SI  Principal Problem: Major depressive disorder, recurrent episode, moderate with anxious distress [facility]) Discharge Diagnoses: Patient Active Problem List   Diagnosis Date Noted  . HTN (hypertension) [I10] 06/26/2016  . GERD (gastroesophageal reflux disease) [K21.9] 06/26/2016  . Alcohol use disorder, severe, 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[place]) [F10.21] 06/26/2016  . Cellulitis [L03.90] 06/26/2016  . Phencyclidine (PCP) use disorder, severe, dependence ([place]) [F16.20] 05/28/2016  . Sedative, hypnotic or anxiolytic use disorder, severe, dependence ([place]) [F13.20] 05/12/2016  . Ventricular fibrillation ([place]) [I49.01] 04/05/2016  . 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 History of ST elevation myocardial infarction (STEMI) [I25.2] 01/05/2016  . Major depressive disorder, recurrent episode, moderate with anxious distress ([place]) [F33.1] 06/21/2015  . Cervical spondylosis without myelopathy [M47.812] 07/24/2014  . Tobacco use disorder [F17.200] 10/20/2010  . Asthma [J45.909] 10/20/2010   History of Present Illness:   Patient is a 46 year old widowed Caucasian male from [place]. Patient has history of major depressive disorder and alcohol use disorder.  Last year the patient was charged with 2 DWIs and also with disorderly conduct. He is awaiting court hearing for those charges this month. He says he has been unable to deal with the anxiety related to possible outcome that he is facing which is going to jail.  Patient was able to stop drinking in February and his last drink was on February 16. Patient has been under psychiatric care for depression and  anxiety for many years. He was mainly treated with high doses of clonazepam  Patient came to the emergency department voluntarily on March 30. He was evaluated by a [facility] and discharged from the emergency department. He came back again on March 31 and was once again discharged from the emergency department. Patient came back to the emergency department on April 1.  All his complaints were worsening depression and anxiety. During his last ER visit he complained about also having suicidal ideation and wanting some help.  Patient was here in our unit from March 2 to March 9. Immediately after discharge on March 10 he came to the emergency department twice. He had a third visit to the emergency department on March 17. [facility] and [facility] was [facility] to [facility]. He was just discharged from there on March 28 (9 day stay).  Patient complains that his anxiety is severe. He feels almost "drunk". Seroquel, Vistaril are not working. He was started on Neurontin and he has not felt any benefit from it. He is afraid of taking it as well because his father and sister were on Neurontin and had suicidality.  Today the patient denies having suicidal thoughts but does feel like life is never going to get better. He denies auditory or visual hallucinations. He complains of having poor concentration, poor appetite poor energy, severe anxiety and severe depression.  As far as substance abuse per chart He had a PCP addiction however he did not report this to me. He smokes heavily. He was drinking about 12 pack a day but quit drinking in February. He tried marijuana years ago denies any recent use.  Urine toxicology only positive  for TCAs (likely false positive for seroquel). Alcohol level was below the detection limit.     Associated Signs/Symptoms: Depression Symptoms:  depressed mood, insomnia, psychomotor agitation, hopelessness, anxiety, (Hypo) Manic Symptoms:   Distractibility, Anxiety Symptoms:  Excessive Worry, Psychotic Symptoms:  denies PTSD Symptoms: NA Total Time spent with patient: 1 hour  Past Psychiatric History: Patient reports that he has had at least 4 hospitalizations in his life, so basically most of them had happened this year as a result of the charges he received last year.  He has been a patient of Dr. suicidal at [facility] in [place] for more than 14 years. He was treated with high-dose clonazepam. High-dose clonazepam was discontinued several months ago as the patient was also drinking.  Denies any history of suicidal attempts  Past Medical History:  Past Medical History:  Diagnosis Date  . Anxiety   . Anxiety   . Arthritis    cerv. stenosis, spondylosis, HNP- lower back , has been followed in pain clinic, has  had injection s in cerv. area  . Blood dyscrasia    told that when he was young he was a" free bleeder"  . CAD (coronary artery disease)   . Cervical spondylosis without myelopathy 07/24/2014  . Cervicogenic headache 07/24/2014  . Chronic kidney disease    renal calculi- passed spontaneously  . Depression   . Diabetes mellitus without complication ([place])   . Fatty liver   . GERD (gastroesophageal reflux disease)   . Headache(784.0)   . Hyperlipidemia   . Hypertension   . Mental disorder   . MI, old   . RLS (restless legs syndrome)    detected on sleep study  . Shortness of breath   . Ventricular fibrillation ([place]) 04/05/2016    Past Surgical History:  Procedure Laterality Date  . ANTERIOR CERVICAL DECOMP/DISCECTOMY FUSION  11/13/2011   Procedure: ANTERIOR CERVICAL DECOMPRESSION/DISCECTOMY FUSION 1 LEVEL;  Surgeon: [name], MD;  Location: [facility] NEURO ORS;  Service: Neurosurgery;  Laterality: N/A;  Anterior Cervical Decompression/discectomy Fusion. Cervical three-four.  [facility] CARDIAC CATHETERIZATION N/A 01/01/2015   Procedure: Left Heart Cath and Coronary Angiography;  Surgeon: [name], MD;  Location: [facility] CV LAB;  Service: Cardiovascular;  Laterality: N/A;  . CARDIAC CATHETERIZATION N/A 04/05/2016   Procedure: Left Heart Cath and Coronary Angiography;  Surgeon: Belva Crome, MD;  Location: [facility] CV LAB;  Service: Cardiovascular;  Laterality: N/A;  . CARDIAC CATHETERIZATION N/A 04/05/2016   Procedure: Coronary Stent Intervention;  Surgeon: Belva Crome, MD;  Location: [facility] CV LAB;  Service: Cardiovascular;  Laterality: N/A;  . CARDIAC CATHETERIZATION N/A 04/05/2016   Procedure: Intravascular Ultrasound/IVUS;  Surgeon: Belva Crome, MD;  Location: [facility] CV LAB;  Service: Cardiovascular;  Laterality: N/A;  . NASAL SINUS SURGERY     2005   Family History:  Family History  Problem Relation Age of Onset  . Prostate cancer Father   . Hypertension Mother   . Kidney Stones Mother   . Anxiety disorder Mother   . Depression Mother   . COPD Sister   . Hypertension Sister   . Diabetes Sister   . Depression Sister   . Anxiety disorder Sister   . Seizures Sister    Family Psychiatric  History: denies  Social History: Patient has been disabled for years after having an injury in his neck. 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 He lives with his mother, sister and the patient's 2  children who are 17 and 46 years old. The patient's first wife died many years ago years ago. He had a long relationship after that but never got married. He had 2 children with this partner.  History  Alcohol Use  . 0.0 oz/week    Comment: trying not to drink      History  Drug Use No    Social History   Social History  . Marital status: Widowed    Spouse name: N/A  . Number of children: 3  . Years of education: 12   Occupational History  .      unemployed   Social History Main Topics  . Smoking status: Current Every Day Smoker    Packs/day: 2.00    Years: 17.00    Types: Cigarettes, Cigars  . Smokeless tobacco: Former Systems developer     Comment: occassional snuff   . Alcohol  use 0.0 oz/week     Comment: trying not to drink   . Drug use: No  . Sexual activity: Not Currently   Other Topics Concern  . None   Social History Narrative   Patient is right handed.   Patient drinks 2 sodas daily.    Hospital Course:    Patient is a 46 year old with depression, chronic anxiety and substance abuse issues. The patient is facing legal charges this month, discharges are related to alcohol dependence. Patient is distressed about the possible outcome of having to go to jail. Patient believes his making jail time by staying in the hospital and seems to think that the judge will not making the stain jail because he has already stated in a lockdown facility   Major depressive disorder: Patient reports significant improvement in mood and anxiety level. He has been eating much better and is sleeping well. He is tolerating medications well. He has been actively participating in groups. The patient says that he has even been encouraging other patients.  The plan today will be to Continue Doxepin which has been titrated up to 150 mg qhs.   Patient likes doxepin and says that he had tried in the past with good response.  For anxiety the patient has failed multiple agents. He says that Seroquel, Neurontin, Vistaril have not helped at all. In preparation for d/c I will change haldol to risperdal as I'm concern he might take more than prescribe and have a dystonic reaction. Will start risperdal 0.5 mg po bid  For insomnia and chronic pain: continue doxepin which will be increased to 150 mg  Cellulitis: Ingrown toenail. Continue  Keflex 500 mg every 8 hours until Tuesday 10  Alcohol use disorder patient has been in remission for about a month. He will benefit from intensive outpatient substance abuse  Chronic pain  increase Flexeril 7.5 mg tid and add meloxican 7.93m bid.   Restless leg syndrome patient has been started on Requip 0.25 mg daily at bedtime  Tobacco use  disorder: pt received  nicotine patch 21 mg  HTN: continue coreg 6.5 mg bid  Dyslipidemia: Continue Lipitor 80 mg a day   Cardiovascular disease continue aspirin 81 mg a day and brilinta   Dispo: back home  F/u: [facility]   Plan to d/c home today   Patient did very well during these hospital stay. Initially he was very focused on medications and was not attending groups. He was educated about the need of active involvement in psychotherapy in order to increase his coping ability. He also was educated about how medication  actually address symptoms. It was pointed out to him that medications was not going to resolve the symptoms but only alleviated them to a point where he was going to be able to participate in therapy.  Patient started to participate very actively in groups. He is stopped asking for changes in medications. He started feeling reporting being more hopeful and future oriented. He denied suicidality homicidality but the time of discharge. He denied major problems with his sleep, appetite energy. He reported significant improvement in concentration. He does  not have any access to guns.  He will be discharged back to his family, his mother and children. Staff working with the patient feels that he is much improved and they do not have any concern about his safety or the safety of others upon discharge.  He did not require seclusion, restraints or forced medications.  Follow-up plan was difficult to establish due to patient having [place] healthcare. We tried to connect him with a day program but due to his insurance they were not able to serve him. He will return to follow up with [place] [facility]. He found an AA group close to his home where he can walk to.     Physical Findings: AIMS:  , ,  ,  ,    CIWA:    COWS:     Musculoskeletal: Strength & Muscle Tone: within normal limits Gait & Station: normal Patient leans: N/A  Psychiatric Specialty Exam: Physical  Exam  Constitutional: He is oriented to person, place, and time. He appears well-developed and well-nourished.  HENT:  Head: Normocephalic and atraumatic.  Eyes: Conjunctivae and EOM are normal.  Neck: Normal range of motion.  Respiratory: Effort normal.  Musculoskeletal: Normal range of motion.  Neurological: He is alert and oriented to person, place, and time.    Review of Systems  Constitutional: Negative.   HENT: Negative.   Eyes: Negative.   Respiratory: Negative.   Cardiovascular: Negative.   Gastrointestinal: Negative.   Genitourinary: Negative.   Musculoskeletal: Positive for back pain and neck pain.  Skin: Negative.   Neurological: Positive for tingling and sensory change.  Endo/Heme/Allergies: Negative.   Psychiatric/Behavioral: Positive for depression and substance abuse. Negative for hallucinations, memory loss and suicidal ideas. The patient is nervous/anxious. The patient does not have insomnia.     Blood pressure 138/84, pulse 74, temperature 98.2 F (36.8 C), temperature source Oral, resp. rate 18, height _0  (1.88 m), weight 90.7 kg (200 lb), SpO2 98 %.Body mass index is 25.68 kg/m.  General Appearance: Well Groomed  Eye Contact:  Good  Speech:  Clear and Coherent  Volume:  Normal  Mood:  Anxious and Dysphoric  Affect:  Appropriate and Congruent  Thought Process:  Linear and Descriptions of Associations: Intact  Orientation:  Full (Time, Place, and Person)  Thought Content:  Hallucinations: None  Suicidal Thoughts:  No  Homicidal Thoughts:  No  Memory:  Immediate;   Good Recent;   Good Remote;   Good  Judgement:  Fair  Insight:  Shallow  Psychomotor Activity:  Normal  Concentration:  Concentration: Fair and Attention Span: Fair  Recall:  Good  Fund of Knowledge:  Good  Language:  Good  Akathisia:  No  Handed:    AIMS (if indicated):     Assets:  Agricultural consultant Housing Physical Health Social Support  ADL's:   Intact  Cognition:  WNL  Sleep:  Number of Hours: 6     Have you used  any form of tobacco in the last 30 days? (Cigarettes, Smokeless Tobacco, Cigars, and/or Pipes): Yes  Has this patient used any form of tobacco in the last 30 days? (Cigarettes, Smokeless Tobacco, Cigars, and/or Pipes) Yes, Yes, A prescription for an FDA-approved tobacco cessation medication was offered at discharge and the patient refused  Blood Alcohol level:  Lab Results  Component Value Date   [facility] <5 06/24/2016   ETH <5 36/64/4034    Metabolic Disorder Labs:  Lab Results  Component Value Date   HGBA1C 5.6 05/27/2016   MPG 114 05/27/2016   MPG 123 04/05/2016   Lab Results  Component Value Date   PROLACTIN 6.5 05/27/2016   Lab Results  Component Value Date   CHOL 217 (H) 05/27/2016   TRIG 334 (H) 05/27/2016   HDL 32 (L) 05/27/2016   CHOLHDL 6.8 05/27/2016   VLDL 67 (H) 05/27/2016   LDLCALC 118 (H) 05/27/2016   [place] UNABLE TO REPORT DUE TO [place] INTERFERENCE 04/05/2016   Results for FLOYED, MASOUD (MRN [id number]) as of 07/03/2016 09:56  Ref. Range 06/23/2016 14:12 06/23/2016 16:14 06/24/2016 15:48 06/24/2016 15:59 06/26/2016 11:41  COMPREHENSIVE METABOLIC PANEL Unknown   Rpt (A)    Sodium Latest Ref Range: 135 - 145 mmol/L   137    Potassium Latest Ref Range: 3.5 - 5.1 mmol/L   4.0    Chloride Latest Ref Range: 101 - 111 mmol/L   104    CO2 Latest Ref Range: 22 - 32 mmol/L   24    Glucose Latest Ref Range: 65 - 99 mg/dL   121 (H)    BUN Latest Ref Range: 6 - 20 mg/dL   11    Creatinine Latest Ref Range: 0.61 - 1.24 mg/dL   0.82    Calcium Latest Ref Range: 8.9 - 10.3 mg/dL   9.5    Anion gap Latest Ref Range: 5 - 15    9    Alkaline Phosphatase Latest Ref Range: 38 - 126 U/L   115    Albumin Latest Ref Range: 3.5 - 5.0 g/dL   4.1    AST Latest Ref Range: 15 - 41 U/L   23    ALT Latest Ref Range: 17 - 63 U/L   36    Total Protein Latest Ref Range: 6.5 - 8.1 g/dL   6.9    Total Bilirubin Latest  Ref Range: 0.3 - 1.2 mg/dL   0.5    EGFR (African American) Latest Ref Range: >60 mL/min   >60    EGFR (Non-African Amer.) Latest Ref Range: >60 mL/min   >60    Troponin I Latest Ref Range: <0.03 ng/mL  <0.03 <0.03    WBC Latest Ref Range: 3.8 - 10.6 K/uL   9.4    RBC Latest Ref Range: 4.40 - 5.90 MIL/uL   5.05    Hemoglobin Latest Ref Range: 13.0 - 18.0 g/dL   15.0    HCT Latest Ref Range: 40.0 - 52.0 %   44.6    MCV Latest Ref Range: 80.0 - 100.0 fL   88.4    MCH Latest Ref Range: 26.0 - 34.0 pg   29.7    MCHC Latest Ref Range: 32.0 - 36.0 g/dL   33.6    RDW Latest Ref Range: 11.5 - 14.5 %   14.4    Platelets Latest Ref Range: 150 - 440 K/uL   194    Alcohol, Ethyl (B) Latest Ref Range: <5 mg/dL   <  5    Amphetamines, Ur Screen Latest Ref Range: NONE DETECTED   NONE DETECTED     Barbiturates, Ur Screen Latest Ref Range: NONE DETECTED   NONE DETECTED     Benzodiazepine, Ur Scrn Latest Ref Range: NONE DETECTED   NONE DETECTED     Cocaine Metabolite,Ur [place] Latest Ref Range: NONE DETECTED   NONE DETECTED     Methadone Scn, Ur Latest Ref Range: NONE DETECTED   NONE DETECTED     MDMA (Ecstasy)Ur Screen Latest Ref Range: NONE DETECTED   NONE DETECTED     Cannabinoid 50 Ng, Ur [place] Latest Ref Range: NONE DETECTED   NONE DETECTED     Opiate, Ur Screen Latest Ref Range: NONE DETECTED   NONE DETECTED     Phencyclidine (PCP) Ur S Latest Ref Range: NONE DETECTED   NONE DETECTED     Tricyclic, Ur Screen Latest Ref Range: NONE DETECTED   POSITIVE (A)      See Psychiatric Specialty Exam and Suicide Risk Assessment completed by Attending Physician prior to discharge.  Discharge destination:  Home  Is patient on multiple antipsychotic therapies at discharge:  No   Has Patient had three or more failed trials of antipsychotic monotherapy by history:  No  Recommended Plan for Multiple Antipsychotic Therapies: NA   Allergies as of 07/03/2016      Reactions   Prednisone Other (See Comments)    Hypertension, makes him feel spacey   Hydrocodone Other (See Comments)   Headache, irritable   Varenicline Other (See Comments)   Suicidal thoughts      Medication List    STOP taking these medications   baclofen 10 MG tablet Commonly known as:  LIORESAL   benztropine 1 MG tablet Commonly known as:  COGENTIN   fluvoxaMINE 100 MG tablet Commonly known as:  LUVOX   gabapentin 400 MG capsule Commonly known as:  NEURONTIN   hydrOXYzine 25 MG tablet Commonly known as:  ATARAX/VISTARIL   naltrexone 50 MG tablet Commonly known as:  DEPADE   QUEtiapine 25 MG tablet Commonly known as:  SEROQUEL   QUEtiapine 300 MG tablet Commonly known as:  SEROQUEL   traZODone 50 MG tablet Commonly known as:  DESYREL     TAKE these medications     Indication  aspirin 81 MG EC tablet Take 1 tablet (81 mg total) by mouth daily.  Indication:  Inflammation   atorvastatin 80 MG tablet Commonly known as:  LIPITOR Take 80 mg by mouth every evening.  Indication:  Ischemic Heart Disease   carvedilol 6.25 MG tablet Commonly known as:  COREG Take 6.25 mg by mouth 2 (two) times daily.  Indication:  High Blood Pressure of Unknown Cause   cephALEXin 500 MG capsule Commonly known as:  KEFLEX Take 1 capsule (500 mg total) by mouth every 8 (eight) hours.  Indication:  Infection of the Skin and/or Skin Structures   cyclobenzaprine 7.5 MG tablet Commonly known as:  FEXMID Take 1 tablet (7.5 mg total) by mouth 3 (three) times daily.  Indication:  Muscle Spasm   doxepin 150 MG capsule Commonly known as:  SINEQUAN Take 1 capsule (150 mg total) by mouth at bedtime.  Indication:  Major Depressive Disorder   meloxicam 7.5 MG tablet Commonly known as:  MOBIC Take 1 tablet (7.5 mg total) by mouth 2 (two) times daily.  Indication:  Joint Damage causing Pain and Loss of Function   nitroGLYCERIN 0.4 MG SL tablet Commonly known as:  NITROSTAT Place 0.4 mg under the tongue every 5 (five) minutes x  3 doses as needed.  Indication:  Acute Angina Pectoris   risperiDONE 0.5 MG tablet Commonly known as:  RISPERDAL Take 1 tablet (0.5 mg total) by mouth 2 (two) times daily.  Indication:  severe anxiety   rOPINIRole 0.25 MG tablet Commonly known as:  REQUIP Take 1 tablet (0.25 mg total) by mouth at bedtime.  Indication:  Restless Leg Syndrome   ticagrelor 90 MG Tabs tablet Commonly known as:  BRILINTA Take 1 tablet (90 mg total) by mouth 2 (two) times daily.  Indication:  Acute Coronary Syndrome      Follow-up [place]. Go on 07/05/2016.   Why:  Please attend your hospital discharge appointment at [facility] on 07/05/16 at 12:30pm.  Please bring photo ID and a copy of your hospital discharge paperwork. Contact information: [place] [address] [phone]        [facility]. Go on 08/01/2016.   Why:  Please attend your follow up appointment with Dr. Kasandra Knudsen on 08/01/16 at 11:20AM. Contact information: [address] [place] [address] [place]. Schedule an appointment as soon as possible for a visit in 1 week(s).   Specialty:  General Practice Contact information: [place] [address] [phone]          >30 minutes. >50 % of the time was spent in coordination of care  Signed: Hildred Priest, MD 07/03/2016, 11:21 AM

## 2016-07-03 NOTE — Progress Notes (Signed)
Patient discharged home. DC instructions provided and explained. Medications reviewed. Rx given. All questions answered. Denies SI, HI, AVH. All questions answered. Belongings returned. AVS, Risk Assessment, and Transition record given to patient. Pt stable at discharge.

## 2016-07-03 NOTE — Tx Team (Signed)
Interdisciplinary Treatment and Diagnostic Plan Update  07/03/2016 Time of Session: 11:05am Michael Schmitt MRN: [id number]  Principal Diagnosis: Major depressive disorder, recurrent episode, moderate with anxious distress ([place])  Secondary Diagnoses: Principal Problem:   Major depressive disorder, recurrent episode, moderate with anxious distress ([place]) Active Problems:   Cervical spondylosis without myelopathy   Tobacco use disorder   Ventricular fibrillation (HCC)   Sedative, hypnotic or anxiolytic use disorder, severe, dependence (HCC)   HTN (hypertension)   GERD (gastroesophageal reflux disease)   Alcohol use disorder, severe, 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[place])   Asthma   Combined hyperlipidemia   History of ST elevation myocardial infarction (STEMI)   Cellulitis   Current Medications:  Current Facility-Administered Medications  Medication Dose Route Frequency Provider Last Rate Last Dose  . acetaminophen (TYLENOL) tablet 1,000 mg  1,000 mg Oral BID Hildred Priest, MD   1,000 mg at 07/03/16 0800  . alum & mag hydroxide-simeth (MAALOX/MYLANTA) 200-200-20 MG/5ML suspension 30 mL  30 mL Oral Q4H PRN Jolanta B Pucilowska, MD      . aspirin EC tablet 81 mg  81 mg Oral Daily Hildred Priest, MD   81 mg at 07/03/16 0800  . atorvastatin (LIPITOR) tablet 80 mg  80 mg Oral QPM Hildred Priest, MD   80 mg at 07/02/16 1728  . carvedilol (COREG) tablet 6.25 mg  6.25 mg Oral BID Hildred Priest, MD   6.25 mg at 07/03/16 0800  . cephALEXin (KEFLEX) capsule 500 mg  500 mg Oral Q8H Hildred Priest, MD   500 mg at 07/03/16 1204  . cyclobenzaprine (FLEXERIL) tablet 7.5 mg  7.5 mg Oral TID Hildred Priest, MD   7.5 mg at 07/03/16 0803  . doxepin (SINEQUAN) capsule 100 mg  100 mg Oral QHS Lenis Noon, RPH   100 mg at 07/02/16 2124  . lidocaine (LIDODERM) 5 % 1 patch  1 patch Transdermal Q24H Hildred Priest, MD   1 patch at 06/30/16 1233   . loperamide (IMODIUM) capsule 4 mg  4 mg Oral PRN Hildred Priest, MD      . magnesium hydroxide (MILK OF MAGNESIA) suspension 30 mL  30 mL Oral Daily PRN Jolanta B Pucilowska, MD      . meloxicam (MOBIC) tablet 7.5 mg  7.5 mg Oral BID Hildred Priest, MD   7.5 mg at 07/03/16 0801  . nicotine (NICODERM CQ - dosed in mg/24 hours) patch 21 mg  21 mg Transdermal Q0600 Clovis Fredrickson, MD   21 mg at 07/03/16 0801  . nitroGLYCERIN (NITROSTAT) SL tablet 0.4 mg  0.4 mg Sublingual Q5 Min x 3 PRN Hildred Priest, MD      . risperiDONE (RISPERDAL) tablet 0.5 mg  0.5 mg Oral BID Hildred Priest, MD   0.5 mg at 07/03/16 1203  . rOPINIRole (REQUIP) tablet 0.25 mg  0.25 mg Oral QHS Hildred Priest, MD   0.25 mg at 07/02/16 2124  . ticagrelor (BRILINTA) tablet 90 mg  90 mg Oral BID Hildred Priest, MD   90 mg at 07/03/16 0800   PTA Medications: Prescriptions Prior to Admission  Medication Sig Dispense Refill Last Dose  . aspirin 81 MG EC tablet Take 1 tablet (81 mg total) by mouth daily. 30 tablet 1 unknown at unknown  . atorvastatin (LIPITOR) 80 MG tablet Take 80 mg by mouth every evening.   unknown at unknown  . baclofen (LIORESAL) 10 MG tablet Take 10 mg by mouth 2 (two) times daily as needed for muscle spasms.  unknown at unknown  . benztropine (COGENTIN) 1 MG tablet Take 1 mg by mouth daily as needed for tremors.   unknown at unknown  . carvedilol (COREG) 6.25 MG tablet Take 6.25 mg by mouth 2 (two) times daily.   unknown at unknown  . fluvoxaMINE (LUVOX) 100 MG tablet Take 1 tablet (100 mg total) by mouth at bedtime. 30 tablet 1 unknown at unknown  . gabapentin (NEURONTIN) 400 MG capsule Take 400 mg by mouth 3 (three) times daily.   unknown at unknown  . hydrOXYzine (ATARAX/VISTARIL) 25 MG tablet Take 1 tablet (25 mg total) by mouth 3 (three) times daily as needed for anxiety. 90 tablet 1 unknown at unknown  . naltrexone (DEPADE) 50 MG  tablet Take 50 mg by mouth daily.   unknown at unknown  . nitroGLYCERIN (NITROSTAT) 0.4 MG SL tablet Place 0.4 mg under the tongue every 5 (five) minutes x 3 doses as needed.   unknown at unknown  . QUEtiapine (SEROQUEL) 25 MG tablet Take 1 tablet (25 mg total) by mouth 3 (three) times daily. 90 tablet 1 unknown at unknown  . QUEtiapine (SEROQUEL) 300 MG tablet Take 1 tablet (300 mg total) by mouth at bedtime. 30 tablet 1 unknown at unknown  . ticagrelor (BRILINTA) 90 MG TABS tablet Take 1 tablet (90 mg total) by mouth 2 (two) times daily. 60 tablet 1 unknown at unknown  . traZODone (DESYREL) 50 MG tablet Take 50 mg by mouth at bedtime as needed for sleep.   unknown at unknown    Patient Stressors: Financial difficulties Legal issue Medication change or noncompliance Substance abuse  Patient Strengths: Motivation for treatment/growth Supportive family/friends  Treatment Modalities: Medication Management, Group therapy, Case management,  1 to 1 session with clinician, Psychoeducation, Recreational therapy.   Physician Treatment Plan for Primary Diagnosis: Major depressive disorder, recurrent episode, moderate with anxious distress ([place]) Long Term Goal(s): Improvement in symptoms so as ready for discharge Improvement in symptoms so as ready for discharge   Short Term Goals: Ability to identify changes in lifestyle to reduce recurrence of condition will improve Ability to demonstrate self-control will improve Ability to identify and develop effective coping behaviors will improve Ability to identify triggers associated with substance abuse/mental health issues will improve Ability to identify changes in lifestyle to reduce recurrence of condition will improve Ability to demonstrate self-control will improve Ability to identify and develop effective coping behaviors will improve Compliance with prescribed medications will improve Ability to identify triggers associated with substance  abuse/mental health issues will improve  Medication Management: Evaluate patient's response, side effects, and tolerance of medication regimen.  Therapeutic Interventions: 1 to 1 sessions, Unit Group sessions and Medication administration.  Evaluation of Outcomes: Adequate for Discharge  Physician Treatment Plan for Secondary Diagnosis: Principal Problem:   Major depressive disorder, recurrent episode, moderate with anxious distress ([place]) Active Problems:   Cervical spondylosis without myelopathy   Tobacco use disorder   Ventricular fibrillation (HCC)   Sedative, hypnotic or anxiolytic use disorder, severe, dependence (HCC)   HTN (hypertension)   GERD (gastroesophageal reflux disease)   Alcohol use disorder, severe, 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[place])   Asthma   Combined hyperlipidemia   History of ST elevation myocardial infarction (STEMI)   Cellulitis  Long Term Goal(s): Improvement in symptoms so as ready for discharge Improvement in symptoms so as ready for discharge   Short Term Goals: Ability to identify changes in lifestyle to reduce recurrence of condition will improve Ability to demonstrate self-control  will improve Ability to identify and develop effective coping behaviors will improve Ability to identify triggers associated with substance abuse/mental health issues will improve Ability to identify changes in lifestyle to reduce recurrence of condition will improve Ability to demonstrate self-control will improve Ability to identify and develop effective coping behaviors will improve Compliance with prescribed medications will improve Ability to identify triggers associated with substance abuse/mental health issues will improve     Medication Management: Evaluate patient's response, side effects, and tolerance of medication regimen.  Therapeutic Interventions: 1 to 1 sessions, Unit Group sessions and Medication administration.  Evaluation of Outcomes: Adequate for  Discharge   RN Treatment Plan for Primary Diagnosis: Major depressive disorder, recurrent episode, moderate with anxious distress ([place]) Long Term Goal(s): Knowledge of disease and therapeutic regimen to maintain health will improve  Short Term Goals: Ability to demonstrate self-control and Ability to identify and develop effective coping behaviors will improve  Medication Management: RN will administer medications as ordered by provider, will assess and evaluate patient's response and provide education to patient for prescribed medication. RN will report any adverse and/or side effects to prescribing provider.  Therapeutic Interventions: 1 on 1 counseling sessions, Psychoeducation, Medication administration, Evaluate responses to treatment, Monitor vital signs and CBGs as ordered, Perform/monitor CIWA, COWS, AIMS and Fall Risk screenings as ordered, Perform wound care treatments as ordered.  Evaluation of Outcomes: Adequate for Discharge   LCSW Treatment Plan for Primary Diagnosis: Major depressive disorder, recurrent episode, moderate with anxious distress ([place]) Long Term Goal(s): Safe transition to appropriate next level of care at discharge, Engage patient in therapeutic group addressing interpersonal concerns.  Short Term Goals: Engage patient in aftercare planning with referrals and resources, Increase emotional regulation and Increase skills for wellness and recovery  Therapeutic Interventions: Assess for all discharge needs, 1 to 1 time with Social worker, Explore available resources and support systems, Assess for adequacy in community support network, Educate family and significant other(s) on suicide prevention, Complete Psychosocial Assessment, Interpersonal group therapy.  Evaluation of Outcomes: Adequate for Discharge    Recreational Therapy Treatment Plan for Primary Diagnosis: Major depressive disorder, recurrent episode, moderate with anxious distress ([place]) Long Term  Goal(s): Patient will participate in recreation therapy treatment in at least 2 group sessions without prompting from LRT  Short Term Goals: Increase self-esteem, Increase stress management skills  Treatment Modalities: Group Therapy and Individual Treatment Sessions  Therapeutic Interventions: Psychoeducation  Evaluation of Outcomes: Progressing   Progress in Treatment: Attending groups: Yes. Participating in groups: Yes. Taking medication as prescribed: Yes. Toleration medication: Yes. Family/Significant other contact made: Yes, individual(s) contacted:  sister Patient understands diagnosis: Yes. Discussing patient identified problems/goals with staff: Yes. Medical problems stabilized or resolved: Yes. Denies suicidal/homicidal ideation: Yes. Issues/concerns per patient self-inventory: No. Other: n/a  New problem(s) identified: Patient no longer has Medicaid insurance.   New Short Term/Long Term Goal(s):  Patient will develop new coping strategies to manage his stress before discharge.   Discharge Plan or Barriers: CSW still assessing appropriate discharge plan. Patient recently lost his Medicaid.   Reason for Continuation of Hospitalization: none  Estimated Length of Stay: Discharging today.  Attendees: Patient:  07/03/2016   Physician: Dr. Hildred Priest, MD 07/03/2016   Nursing: Floyde Parkins, RN 07/03/2016   RN Care Manager: 07/03/2016   Social Worker: Lurline Idol MSW, LCSW 07/03/2016   Recreational Therapist: Leonette Monarch, LRT/CTRS 07/03/2016   Other:  07/03/2016   Other:  07/03/2016   Other: 07/03/2016  Scribe for Treatment Team: Deirdre Evener 07/03/2016 12:42 PM

## 2016-07-03 NOTE — Progress Notes (Signed)
Recreation Therapy Notes  Date: 04.09.18 Time: 1:00 pm Location: Craft Room  Group Topic: Wellness  Goal Area(s) Addresses:  Patient will identify at least one item per dimension of health.  Patient will examine areas they are deficient in.  Behavioral Response: Attentive, Interactive, Left early  Intervention: 6 Dimensions of Health  Activity: Patients were given a definition sheet defining each dimension of health and a worksheet with each dimension listed. Patients were instructed to write at least one item that they were currently doing in each dimension.  Education: LRT educated patients on ways to improve each dimension of health.  Education Outcome: Acknowledges education/In group clarification offered   Clinical Observations/Feedback: Patient wrote at least one item in each dimension. Patient contributed to group discussion by stating ways he could improve certain dimensions. Patient left group at approximately 1:35 pm with nurse and did not return to group.  Leonette Monarch, LRT/CTRS 07/03/2016 2:07 PM

## 2016-07-03 NOTE — Progress Notes (Signed)
  [facility] Adult Case Management Discharge Plan :  Will you be returning to the same living situation after discharge:  Yes,  with mother, sister At discharge, do you have transportation home?: Yes,  sister Do you have the ability to pay for your medications: Yes,  [facility] medicare  Release of information consent forms completed and in the chart;  Patient's signature needed at discharge.  Patient to Follow up at: [facility]. Go on 07/05/2016.   Why:  Please attend your hospital discharge appointment at [facility] on 07/05/16 at 12:30pm.  Please bring photo ID and a copy of your hospital discharge paperwork. Contact information: [place] [address] [phone]        [facility]. Go on 08/01/2016.   Why:  Please attend your follow up appointment with Dr. Kasandra Knudsen on 08/01/16 at 11:20AM. Contact information: [place] [phone] [name] Follow up on 07/27/2016.   Specialty:  Behavioral Health Why:  Your appointment is at 1:30 pm. Please arrive 15 minutes early. Please bring your insurance card and an updated list of your medications. Contact information: [place] [phone]          Next level of care provider has access to [place] and Suicide Prevention discussed: Yes,  with sister  Have you used any form of tobacco in the last 30 days? (Cigarettes, Smokeless Tobacco, Cigars, and/or Pipes): Yes  Has patient been referred to the Quitline?: Patient refused referral  Patient has been referred for addiction treatment: Yes  Michael Schmitt , [place] 07/03/2016, 2:42 PM

## 2016-07-04 ENCOUNTER — Emergency Department
Admission: EM | Admit: 2016-07-04 | Discharge: 2016-07-04 | Disposition: A | Discharge status: Home / Self Care | Payer: Medicare Other | Attending: Emergency Medicine | Admitting: Emergency Medicine

## 2016-07-04 ENCOUNTER — Encounter: Payer: Self-pay | Admitting: Emergency Medicine

## 2016-07-04 DIAGNOSIS — I129 Hypertensive chronic kidney disease with stage 1 through stage 4 chronic kidney disease, or unspecified chronic kidney disease: Secondary | ICD-10-CM | POA: Insufficient documentation

## 2016-07-04 DIAGNOSIS — F17290 Nicotine dependence, other tobacco product, uncomplicated: Secondary | ICD-10-CM | POA: Insufficient documentation

## 2016-07-04 DIAGNOSIS — I2510 Atherosclerotic heart disease of native coronary artery without angina pectoris: Secondary | ICD-10-CM | POA: Insufficient documentation

## 2016-07-04 DIAGNOSIS — Z139 Encounter for screening, unspecified: Secondary | ICD-10-CM

## 2016-07-04 DIAGNOSIS — J45909 Unspecified asthma, uncomplicated: Secondary | ICD-10-CM | POA: Diagnosis not present

## 2016-07-04 DIAGNOSIS — R197 Diarrhea, unspecified: Secondary | ICD-10-CM | POA: Diagnosis not present

## 2016-07-04 DIAGNOSIS — F17210 Nicotine dependence, cigarettes, uncomplicated: Secondary | ICD-10-CM | POA: Diagnosis not present

## 2016-07-04 DIAGNOSIS — E1122 Type 2 diabetes mellitus with diabetic chronic kidney disease: Secondary | ICD-10-CM | POA: Insufficient documentation

## 2016-07-04 DIAGNOSIS — I252 Old myocardial infarction: Secondary | ICD-10-CM | POA: Diagnosis not present

## 2016-07-04 DIAGNOSIS — Z7982 Long term (current) use of aspirin: Secondary | ICD-10-CM | POA: Insufficient documentation

## 2016-07-04 DIAGNOSIS — N189 Chronic kidney disease, unspecified: Secondary | ICD-10-CM | POA: Diagnosis not present

## 2016-07-04 DIAGNOSIS — Z79899 Other long term (current) drug therapy: Secondary | ICD-10-CM | POA: Diagnosis not present

## 2016-07-04 DIAGNOSIS — Z046 Encounter for general psychiatric examination, requested by authority: Secondary | ICD-10-CM | POA: Insufficient documentation

## 2016-07-04 LAB — URINE DRUG SCREEN, QUALITATIVE (ARMC ONLY)
AMPHETAMINES, UR SCREEN: NOT DETECTED
BARBITURATES, UR SCREEN: NOT DETECTED
BENZODIAZEPINE, UR SCRN: NOT DETECTED
Cannabinoid 50 Ng, Ur ~~LOC~~: NOT DETECTED
Cocaine Metabolite,Ur ~~LOC~~: NOT DETECTED
MDMA (Ecstasy)Ur Screen: NOT DETECTED
METHADONE SCREEN, URINE: NOT DETECTED
Opiate, Ur Screen: NOT DETECTED
PHENCYCLIDINE (PCP) UR S: NOT DETECTED
Tricyclic, Ur Screen: POSITIVE — AB

## 2016-07-04 LAB — CBC
HCT: 44.3 % (ref 40.0–52.0)
Hemoglobin: 14.7 g/dL (ref 13.0–18.0)
MCH: 29.5 pg (ref 26.0–34.0)
MCHC: 33.3 g/dL (ref 32.0–36.0)
MCV: 88.5 fL (ref 80.0–100.0)
PLATELETS: 196 10*3/uL (ref 150–440)
RBC: 5 MIL/uL (ref 4.40–5.90)
RDW: 14.1 % (ref 11.5–14.5)
WBC: 6.9 10*3/uL (ref 3.8–10.6)

## 2016-07-04 LAB — COMPREHENSIVE METABOLIC PANEL
ALK PHOS: 103 U/L (ref 38–126)
ALT: 51 U/L (ref 17–63)
AST: 31 U/L (ref 15–41)
Albumin: 4.3 g/dL (ref 3.5–5.0)
Anion gap: 9 (ref 5–15)
BUN: 8 mg/dL (ref 6–20)
CALCIUM: 9.6 mg/dL (ref 8.9–10.3)
CO2: 21 mmol/L — ABNORMAL LOW (ref 22–32)
CREATININE: 0.92 mg/dL (ref 0.61–1.24)
Chloride: 106 mmol/L (ref 101–111)
Glucose, Bld: 171 mg/dL — ABNORMAL HIGH (ref 65–99)
Potassium: 4 mmol/L (ref 3.5–5.1)
Sodium: 136 mmol/L (ref 135–145)
Total Bilirubin: 0.5 mg/dL (ref 0.3–1.2)
Total Protein: 7.4 g/dL (ref 6.5–8.1)

## 2016-07-04 LAB — ETHANOL
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
## 2016-07-05 DIAGNOSIS — I219 Acute myocardial infarction, unspecified: Secondary | ICD-10-CM | POA: Insufficient documentation

## 2016-07-06 DIAGNOSIS — R9431 Abnormal electrocardiogram [ECG] [EKG]: Secondary | ICD-10-CM | POA: Diagnosis not present

## 2016-07-06 DIAGNOSIS — I252 Old myocardial infarction: Secondary | ICD-10-CM | POA: Diagnosis not present

## 2016-07-18 DIAGNOSIS — N189 Chronic kidney disease, unspecified: Secondary | ICD-10-CM | POA: Diagnosis not present

## 2016-07-18 DIAGNOSIS — K219 Gastro-esophageal reflux disease without esophagitis: Secondary | ICD-10-CM | POA: Diagnosis not present

## 2016-07-18 DIAGNOSIS — I252 Old myocardial infarction: Secondary | ICD-10-CM | POA: Diagnosis not present

## 2016-07-18 DIAGNOSIS — G4700 Insomnia, unspecified: Secondary | ICD-10-CM | POA: Diagnosis not present

## 2016-07-18 DIAGNOSIS — I2510 Atherosclerotic heart disease of native coronary artery without angina pectoris: Secondary | ICD-10-CM | POA: Diagnosis not present

## 2016-07-18 DIAGNOSIS — I129 Hypertensive chronic kidney disease with stage 1 through stage 4 chronic kidney disease, or unspecified chronic kidney disease: Secondary | ICD-10-CM | POA: Diagnosis not present

## 2016-07-18 DIAGNOSIS — E1122 Type 2 diabetes mellitus with diabetic chronic kidney disease: Secondary | ICD-10-CM | POA: Diagnosis not present

## 2016-07-18 DIAGNOSIS — I10 Essential (primary) hypertension: Secondary | ICD-10-CM | POA: Diagnosis not present

## 2016-07-18 DIAGNOSIS — Z79899 Other long term (current) drug therapy: Secondary | ICD-10-CM | POA: Diagnosis not present

## 2016-07-18 DIAGNOSIS — Z7982 Long term (current) use of aspirin: Secondary | ICD-10-CM | POA: Diagnosis not present

## 2016-07-18 DIAGNOSIS — M25512 Pain in left shoulder: Secondary | ICD-10-CM | POA: Diagnosis not present

## 2016-07-19 DIAGNOSIS — N189 Chronic kidney disease, unspecified: Secondary | ICD-10-CM | POA: Diagnosis not present

## 2016-07-19 DIAGNOSIS — Z7982 Long term (current) use of aspirin: Secondary | ICD-10-CM | POA: Diagnosis not present

## 2016-07-19 DIAGNOSIS — Z79899 Other long term (current) drug therapy: Secondary | ICD-10-CM | POA: Diagnosis not present

## 2016-07-19 DIAGNOSIS — E1122 Type 2 diabetes mellitus with diabetic chronic kidney disease: Secondary | ICD-10-CM | POA: Diagnosis not present

## 2016-07-19 DIAGNOSIS — I2510 Atherosclerotic heart disease of native coronary artery without angina pectoris: Secondary | ICD-10-CM | POA: Diagnosis not present

## 2016-07-19 DIAGNOSIS — I129 Hypertensive chronic kidney disease with stage 1 through stage 4 chronic kidney disease, or unspecified chronic kidney disease: Secondary | ICD-10-CM | POA: Diagnosis not present

## 2016-07-19 DIAGNOSIS — I252 Old myocardial infarction: Secondary | ICD-10-CM | POA: Diagnosis not present

## 2016-07-19 DIAGNOSIS — K219 Gastro-esophageal reflux disease without esophagitis: Secondary | ICD-10-CM | POA: Diagnosis not present

## 2016-07-27 ENCOUNTER — Ambulatory Visit: Payer: Self-pay | Admitting: Family Medicine

## 2016-08-02 ENCOUNTER — Encounter (HOSPITAL_COMMUNITY): Payer: Self-pay | Admitting: *Deleted

## 2016-08-02 ENCOUNTER — Observation Stay (HOSPITAL_COMMUNITY)
Admission: AD | Admit: 2016-08-02 | Discharge: 2016-08-03 | Disposition: A | Discharge status: Home / Self Care | Payer: 59 | Attending: Psychiatry | Admitting: Psychiatry

## 2016-08-02 DIAGNOSIS — J45909 Unspecified asthma, uncomplicated: Secondary | ICD-10-CM | POA: Diagnosis not present

## 2016-08-02 DIAGNOSIS — E782 Mixed hyperlipidemia: Secondary | ICD-10-CM | POA: Insufficient documentation

## 2016-08-02 DIAGNOSIS — K760 Fatty (change of) liver, not elsewhere classified: Secondary | ICD-10-CM | POA: Insufficient documentation

## 2016-08-02 DIAGNOSIS — I129 Hypertensive chronic kidney disease with stage 1 through stage 4 chronic kidney disease, or unspecified chronic kidney disease: Secondary | ICD-10-CM | POA: Insufficient documentation

## 2016-08-02 DIAGNOSIS — F1021 Alcohol dependence, in remission: Secondary | ICD-10-CM | POA: Insufficient documentation

## 2016-08-02 DIAGNOSIS — N189 Chronic kidney disease, unspecified: Secondary | ICD-10-CM | POA: Insufficient documentation

## 2016-08-02 DIAGNOSIS — Z7982 Long term (current) use of aspirin: Secondary | ICD-10-CM | POA: Insufficient documentation

## 2016-08-02 DIAGNOSIS — I252 Old myocardial infarction: Secondary | ICD-10-CM | POA: Diagnosis not present

## 2016-08-02 DIAGNOSIS — Z885 Allergy status to narcotic agent status: Secondary | ICD-10-CM | POA: Diagnosis not present

## 2016-08-02 DIAGNOSIS — F17210 Nicotine dependence, cigarettes, uncomplicated: Secondary | ICD-10-CM | POA: Insufficient documentation

## 2016-08-02 DIAGNOSIS — I2510 Atherosclerotic heart disease of native coronary artery without angina pectoris: Secondary | ICD-10-CM | POA: Insufficient documentation

## 2016-08-02 DIAGNOSIS — Z915 Personal history of self-harm: Secondary | ICD-10-CM | POA: Insufficient documentation

## 2016-08-02 DIAGNOSIS — Z7902 Long term (current) use of antithrombotics/antiplatelets: Secondary | ICD-10-CM | POA: Insufficient documentation

## 2016-08-02 DIAGNOSIS — R45851 Suicidal ideations: Secondary | ICD-10-CM | POA: Insufficient documentation

## 2016-08-02 DIAGNOSIS — F333 Major depressive disorder, recurrent, severe with psychotic symptoms: Secondary | ICD-10-CM | POA: Diagnosis not present

## 2016-08-02 DIAGNOSIS — K219 Gastro-esophageal reflux disease without esophagitis: Secondary | ICD-10-CM | POA: Insufficient documentation

## 2016-08-02 DIAGNOSIS — F411 Generalized anxiety disorder: Secondary | ICD-10-CM | POA: Diagnosis not present

## 2016-08-02 DIAGNOSIS — G2581 Restless legs syndrome: Secondary | ICD-10-CM | POA: Diagnosis not present

## 2016-08-02 DIAGNOSIS — Z590 Homelessness: Secondary | ICD-10-CM | POA: Insufficient documentation

## 2016-08-02 DIAGNOSIS — E1122 Type 2 diabetes mellitus with diabetic chronic kidney disease: Secondary | ICD-10-CM | POA: Insufficient documentation

## 2016-08-02 LAB — URINALYSIS, ROUTINE W REFLEX MICROSCOPIC
BILIRUBIN URINE: NEGATIVE
Glucose, UA: NEGATIVE mg/dL
Hgb urine dipstick: NEGATIVE
KETONES UR: NEGATIVE mg/dL
Leukocytes, UA: NEGATIVE
NITRITE: NEGATIVE
Protein, ur: NEGATIVE mg/dL
Specific Gravity, Urine: 1.014 (ref 1.005–1.030)
pH: 6 (ref 5.0–8.0)

## 2016-08-02 MED ORDER — MIRTAZAPINE 15 MG PO TABS
45.0000 mg | ORAL_TABLET | Freq: Every day | ORAL | Status: DC
  Administered 2016-08-02: 45 mg via ORAL
  Filled 2016-08-02: qty 3

## 2016-08-02 MED ORDER — LISINOPRIL 10 MG PO TABS: 10.0000 mg | ORAL_TABLET | Freq: Every day | ORAL | Status: DC

## 2016-08-02 MED ORDER — CITALOPRAM HYDROBROMIDE 20 MG PO TABS
20.0000 mg | ORAL_TABLET | Freq: Every day | ORAL | Status: DC
  Administered 2016-08-02 – 2016-08-03 (×2): 20 mg via ORAL
  Filled 2016-08-02 (×2): qty 1

## 2016-08-02 MED ORDER — NICOTINE 21 MG/24HR TD PT24
21.0000 mg | MEDICATED_PATCH | Freq: Every day | TRANSDERMAL | Status: DC
  Administered 2016-08-02 – 2016-08-03 (×2): 21 mg via TRANSDERMAL
  Filled 2016-08-02 (×2): qty 1

## 2016-08-02 MED ORDER — ALUM & MAG HYDROXIDE-SIMETH 200-200-20 MG/5ML PO SUSP: 30.0000 mL | ORAL | Status: DC | PRN

## 2016-08-02 MED ORDER — HYDROXYZINE HCL 25 MG PO TABS
25.0000 mg | ORAL_TABLET | Freq: Four times a day (QID) | ORAL | Status: DC | PRN
  Administered 2016-08-02 – 2016-08-03 (×3): 25 mg via ORAL
  Filled 2016-08-02 (×5): qty 1

## 2016-08-02 MED ORDER — TRAZODONE HCL 100 MG PO TABS
100.0000 mg | ORAL_TABLET | Freq: Every day | ORAL | Status: DC
  Administered 2016-08-02: 100 mg via ORAL
  Filled 2016-08-02: qty 1

## 2016-08-02 MED ORDER — ACETAMINOPHEN 325 MG PO TABS
650.0000 mg | ORAL_TABLET | Freq: Four times a day (QID) | ORAL | Status: DC | PRN
  Administered 2016-08-02 – 2016-08-03 (×3): 650 mg via ORAL
  Filled 2016-08-02 (×3): qty 2

## 2016-08-02 MED ORDER — LISINOPRIL 5 MG PO TABS
5.0000 mg | ORAL_TABLET | Freq: Every day | ORAL | Status: DC
  Administered 2016-08-03: 5 mg via ORAL
  Filled 2016-08-02: qty 1

## 2016-08-02 MED ORDER — MAGNESIUM HYDROXIDE 400 MG/5ML PO SUSP
30.0000 mL | Freq: Every day | ORAL | Status: DC | PRN
Start: 2016-08-02 — End: 2016-08-03

## 2016-08-02 MED ORDER — ASENAPINE MALEATE 5 MG SL SUBL
10.0000 mg | SUBLINGUAL_TABLET | Freq: Every day | SUBLINGUAL | Status: DC
  Administered 2016-08-02: 10 mg via SUBLINGUAL
  Filled 2016-08-02 (×3): qty 2

## 2016-08-02 NOTE — Progress Notes (Addendum)
D) Pt. Is 46 year old pt. Who reports depression and SI with no specific plan for 2 weeks.  Pt. Repots he has "tried everything for his depression"  ( ie all medications and "nothing has worked".  Pt. Reports he is interested in receiving ECT treatments. Reports no specific stressors and states "I have to live for my kids".  Pt. Has cardiac history and reports 2 prior heart attacks with 4 stints placed ( 1 in January 2018, and 3  Others placed in October 2016.)  Pt. reports no alcohol or drug use since 04/2016.  Pt. Is 1-2 packe per day smoker.  Has used THC when "he was much younger".  Pt. Reports chronic pain in neck which ranges "never gets better than a 6". Pt. reports pain is from neck surgery in 2013  Pt. States he does not take percocet which has prescribed because "I'm afraid I'll get addicted".  Pt. Appears depressed and is talkative and anxious.  A) VS taken, skin assessment completed, belonging stowed in locker and brought to observation unit.  R) Pt. Receptive, restless, but cooperative.
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
## 2016-08-03 DIAGNOSIS — F17210 Nicotine dependence, cigarettes, uncomplicated: Secondary | ICD-10-CM

## 2016-08-03 DIAGNOSIS — Z818 Family history of other mental and behavioral disorders: Secondary | ICD-10-CM | POA: Diagnosis not present

## 2016-08-03 DIAGNOSIS — R45851 Suicidal ideations: Secondary | ICD-10-CM | POA: Diagnosis not present

## 2016-08-03 DIAGNOSIS — G2581 Restless legs syndrome: Secondary | ICD-10-CM | POA: Diagnosis not present

## 2016-08-03 DIAGNOSIS — E1122 Type 2 diabetes mellitus with diabetic chronic kidney disease: Secondary | ICD-10-CM | POA: Diagnosis not present

## 2016-08-03 DIAGNOSIS — N189 Chronic kidney disease, unspecified: Secondary | ICD-10-CM | POA: Diagnosis not present

## 2016-08-03 DIAGNOSIS — F333 Major depressive disorder, recurrent, severe with psychotic symptoms: Secondary | ICD-10-CM | POA: Diagnosis not present

## 2016-08-03 DIAGNOSIS — Z885 Allergy status to narcotic agent status: Secondary | ICD-10-CM | POA: Diagnosis not present

## 2016-08-03 DIAGNOSIS — K219 Gastro-esophageal reflux disease without esophagitis: Secondary | ICD-10-CM | POA: Diagnosis not present

## 2016-08-03 DIAGNOSIS — J45909 Unspecified asthma, uncomplicated: Secondary | ICD-10-CM | POA: Diagnosis not present

## 2016-08-03 DIAGNOSIS — Z7982 Long term (current) use of aspirin: Secondary | ICD-10-CM | POA: Diagnosis not present

## 2016-08-03 DIAGNOSIS — I252 Old myocardial infarction: Secondary | ICD-10-CM | POA: Diagnosis not present

## 2016-08-03 DIAGNOSIS — Z7902 Long term (current) use of antithrombotics/antiplatelets: Secondary | ICD-10-CM | POA: Diagnosis not present

## 2016-08-03 DIAGNOSIS — Z590 Homelessness: Secondary | ICD-10-CM | POA: Diagnosis not present

## 2016-08-03 DIAGNOSIS — I129 Hypertensive chronic kidney disease with stage 1 through stage 4 chronic kidney disease, or unspecified chronic kidney disease: Secondary | ICD-10-CM | POA: Diagnosis not present

## 2016-08-03 DIAGNOSIS — I2510 Atherosclerotic heart disease of native coronary artery without angina pectoris: Secondary | ICD-10-CM | POA: Diagnosis not present

## 2016-08-03 DIAGNOSIS — E782 Mixed hyperlipidemia: Secondary | ICD-10-CM | POA: Diagnosis not present

## 2016-08-03 DIAGNOSIS — K760 Fatty (change of) liver, not elsewhere classified: Secondary | ICD-10-CM | POA: Diagnosis not present

## 2016-08-03 LAB — COMPREHENSIVE METABOLIC PANEL
ALBUMIN: 4.4 g/dL (ref 3.5–5.0)
ALT: 32 U/L (ref 17–63)
AST: 22 U/L (ref 15–41)
Alkaline Phosphatase: 134 U/L — ABNORMAL HIGH (ref 38–126)
Anion gap: 9 (ref 5–15)
BILIRUBIN TOTAL: 0.5 mg/dL (ref 0.3–1.2)
BUN: 10 mg/dL (ref 6–20)
CHLORIDE: 107 mmol/L (ref 101–111)
CO2: 24 mmol/L (ref 22–32)
Calcium: 9.5 mg/dL (ref 8.9–10.3)
Creatinine, Ser: 0.86 mg/dL (ref 0.61–1.24)
GFR calc Af Amer: >60 mL/min (ref >60)
GFR calc non Af Amer: >60 mL/min (ref >60)
GLUCOSE: 176 mg/dL — AB (ref 65–99)
POTASSIUM: 4 mmol/L (ref 3.5–5.1)
SODIUM: 140 mmol/L (ref 135–145)
Total Protein: 7.6 g/dL (ref 6.5–8.1)

## 2016-08-03 LAB — CBC
HEMATOCRIT: 44.4 % (ref 39.0–52.0)
Hemoglobin: 14.6 g/dL (ref 13.0–17.0)
MCH: 29 pg (ref 26.0–34.0)
MCHC: 32.9 g/dL (ref 30.0–36.0)
MCV: 88.1 fL (ref 78.0–100.0)
Platelets: 275 10*3/uL (ref 150–400)
RBC: 5.04 MIL/uL (ref 4.22–5.81)
RDW: 13.9 % (ref 11.5–15.5)
WBC: 7.6 10*3/uL (ref 4.0–10.5)

## 2016-08-03 LAB — LIPID PANEL
CHOL/HDL RATIO: 3.5 ratio
Cholesterol: 112 mg/dL (ref 0–200)
HDL: 32 mg/dL — AB (ref >40)
LDL CALC: 44 mg/dL (ref 0–99)
Triglycerides: 180 mg/dL — ABNORMAL HIGH (ref <150)
VLDL: 36 mg/dL (ref 0–40)

## 2016-08-03 LAB — ETHANOL

## 2016-08-03 LAB — TSH: TSH: 0.843 u[IU]/mL (ref 0.350–4.500)

## 2016-08-03 MED ORDER — MIRTAZAPINE 45 MG PO TABS: 45.0000 mg | ORAL_TABLET | Freq: Every day | ORAL | 0 refills | Status: DC

## 2016-08-03 MED ORDER — TRAZODONE HCL 100 MG PO TABS: 100.0000 mg | ORAL_TABLET | Freq: Every evening | ORAL | 0 refills | Status: DC | PRN

## 2016-08-03 MED ORDER — CITALOPRAM HYDROBROMIDE 20 MG PO TABS: 20.0000 mg | ORAL_TABLET | Freq: Every day | ORAL | 0 refills | Status: DC

## 2016-08-03 MED ORDER — NICOTINE 21 MG/24HR TD PT24: 21.0000 mg | MEDICATED_PATCH | Freq: Every day | TRANSDERMAL | 0 refills | Status: DC

## 2016-08-03 MED ORDER — ASENAPINE MALEATE 5 MG SL SUBL: 10.0000 mg | SUBLINGUAL_TABLET | Freq: Two times a day (BID) | SUBLINGUAL | 0 refills | Status: DC

## 2016-08-03 MED ORDER — HYDROXYZINE HCL 25 MG PO TABS: 25.0000 mg | ORAL_TABLET | Freq: Four times a day (QID) | ORAL | 0 refills | Status: DC | PRN

## 2016-08-03 NOTE — Progress Notes (Signed)
D: Michael Schmitt endorses SI and says he hears voices to harm himself. He says he feels hopeless and doesn't know what he's going to do, but wants help so he can be there for his children. He wants ECT. He does not feel ready for discharge. He says Saphris helped him last night, "but only for a little while." He does not feel that trazodone helps at all. He feels like he is "just laying here" when he needs more help.  A: Meds given as ordered. Support/encouragement offered.   R: Pt remains free from harm and continues with treatment. Will continue to monitor for needs/safety.

## 2016-08-03 NOTE — Progress Notes (Signed)
Patient was discharged per order. AVS and scripts were all reviewed with patient. Pt was given an opportunity to ask questions and verbalized understanding of all discharge paperwork. Belongings were returned, and patient signed for receipt. Patient verbalized readiness for discharge and appeared in no acute distress when escorted to lobby by [facility].
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
## 2016-08-03 NOTE — BHH Counselor (Addendum)
Pt has a follow up appointment with [facility] in [place] with [name] on Thursday, Aug 10, 2016 @ 3pm. Pt made aware of this appointment and was informed that he should arrive 30 mins early, bring a list of his medications, and a copy of his insurance card. Pt will be getting discharged later today after 12pm.

## 2016-08-03 NOTE — Discharge Summary (Signed)
[facility] OBS Unit  Discharge Summary Note  Patient:  Michael Schmitt is an 46 y.o., male MRN:  [id number] DOB:  March 22, 1971 Patient phone:  [phone] (home)  Patient address:   [address] [place] [address],  Total Time spent with patient: 20 minutes  Date of Admission:  08/02/2016 Date of Discharge: 08/03/2016  Reason for Admission: Depression and Suicidal Ideation   Principal Problem: Major depressive disorder, recurrent, severe with psychotic features [facility])  Discharge Diagnoses: Patient Active Problem List   Diagnosis Date Noted  . Major depressive disorder, recurrent, severe with psychotic features ([place]) [F33.3] 08/02/2016  . HTN (hypertension) [I10] 06/26/2016  . GERD (gastroesophageal reflux disease) [K21.9] 06/26/2016  . Alcohol use disorder, severe, 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[place]) [F10.21] 06/26/2016  . Cellulitis [L03.90] 06/26/2016  . Phencyclidine (PCP) use disorder, severe, dependence ([place]) [F16.20] 05/28/2016  . Sedative, hypnotic or anxiolytic use disorder, severe, dependence ([place]) [F13.20] 05/12/2016  . Ventricular fibrillation ([place]) [I49.01] 04/05/2016  . 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 History of ST elevation myocardial infarction (STEMI) [I25.2] 01/05/2016  . Major depressive disorder, recurrent episode, moderate with anxious distress ([place]) [F33.1] 06/21/2015  . Cervical spondylosis without myelopathy [M47.812] 07/24/2014  . Tobacco use disorder [F17.200] 10/20/2010  . Asthma [J45.909] 10/20/2010    Past Psychiatric History: MDD, Suicidal ideation, Substance Abuse disorder  Past Medical History:  Past Medical History:  Diagnosis Date  . Anxiety   . Anxiety   . Arthritis    cerv. stenosis, spondylosis, HNP- lower back , has been followed in pain clinic, has  had injection s in cerv. area  . Blood dyscrasia    told that when he was young he was a" free bleeder"  . CAD (coronary artery disease)   . Cervical spondylosis without myelopathy  07/24/2014  . Cervicogenic headache 07/24/2014  . Chronic kidney disease    renal calculi- passed spontaneously  . Depression   . Diabetes mellitus without complication ([place])   . Fatty liver   . GERD (gastroesophageal reflux disease)   . Headache(784.0)   . Hyperlipidemia   . Hypertension   . Mental disorder   . MI, old   . RLS (restless legs syndrome)    detected on sleep study  . Shortness of breath   . Ventricular fibrillation ([place]) 04/05/2016    Past Surgical History:  Procedure Laterality Date  . ANTERIOR CERVICAL DECOMP/DISCECTOMY FUSION  11/13/2011   Procedure: ANTERIOR CERVICAL DECOMPRESSION/DISCECTOMY FUSION 1 LEVEL;  Surgeon: [name], MD;  Location: [facility];  Service: Neurosurgery;  Laterality: N/A;  Anterior Cervical Decompression/discectomy Fusion. Cervical three-four.  Marland Kitchen CARDIAC CATHETERIZATION N/A 01/01/2015   Procedure: Left Heart Cath and Coronary Angiography;  Surgeon: [name], MD;  Location: [facility] CV LAB;  Service: Cardiovascular;  Laterality: N/A;  . CARDIAC CATHETERIZATION N/A 04/05/2016   Procedure: Left Heart Cath and Coronary Angiography;  Surgeon: Belva Crome, MD;  Location: [facility] CV LAB;  Service: Cardiovascular;  Laterality: N/A;  . CARDIAC CATHETERIZATION N/A 04/05/2016   Procedure: Coronary Stent Intervention;  Surgeon: Belva Crome, MD;  Location: [facility] CV LAB;  Service: Cardiovascular;  Laterality: N/A;  . CARDIAC CATHETERIZATION N/A 04/05/2016   Procedure: Intravascular Ultrasound/IVUS;  Surgeon: Belva Crome, MD;  Location: [facility] CV LAB;  Service: Cardiovascular;  Laterality: N/A;  . NASAL SINUS SURGERY     2005   Family History:  Family History  Problem Relation Age of Onset  . Prostate cancer  Father   . Hypertension Mother   . Kidney Stones Mother   . Anxiety disorder Mother   . Depression Mother   . COPD Sister   . Hypertension Sister   . Diabetes Sister   . Depression Sister   . Anxiety disorder Sister    . Seizures Sister    Family Psychiatric  History: Unknown Social History:  History  Alcohol Use  . 0.0 oz/week    Comment: trying not to drink      History  Drug Use No    Social History   Social History  . Marital status: Widowed    Spouse name: N/A  . Number of children: 3  . Years of education: 12   Occupational History  .      unemployed   Social History Main Topics  . Smoking status: Current Every Day Smoker    Packs/day: 2.00    Years: 17.00    Types: Cigarettes, Cigars  . Smokeless tobacco: Former Systems developer     Comment: occassional snuff   . Alcohol use 0.0 oz/week     Comment: trying not to drink   . Drug use: No  . Sexual activity: Not Currently   Other Topics Concern  . None   Social History Narrative   Patient is right handed.   Patient drinks 2 sodas daily.    Hospital Course:    Physical Findings: AIMS: Facial and Oral Movements Muscles of Facial Expression: None, normal Lips and Perioral Area: None, normal Jaw: None, normal Tongue: None, normal,Extremity Movements Upper (arms, wrists, hands, fingers): None, normal Lower (legs, knees, ankles, toes): None, normal, Trunk Movements Neck, shoulders, hips: None, normal, Overall Severity Severity of abnormal movements (highest score from questions above): None, normal Incapacitation due to abnormal movements: None, normal Patient's awareness of abnormal movements (rate only patient's report): No Awareness, Dental Status Current problems with teeth and/or dentures?: No Does patient usually wear dentures?: No  CIWA:    COWS:     Musculoskeletal: Unable to assess: camera  Psychiatric Specialty Exam: Physical Exam  ROS  Blood pressure (!) 141/89, pulse (!) 101, temperature 97.9 F (36.6 C), temperature source Oral, resp. rate 18, height 6\' 1"  (1.854 m), weight 85.5 kg (188 lb 8 oz), SpO2 100 %.Body mass index is 24.87 kg/m.  General Appearance: Casual  Eye Contact:  Good  Speech:  Clear and  Coherent and Normal Rate  Volume:  Normal  Mood:  Anxious and Depressed  Affect:  Congruent  Thought Process:  Coherent, Goal Directed and Linear  Orientation:  Full (Time, Place, and Person)  Thought Content:  Logical  Suicidal Thoughts:  Yes.  without intent/plan  Homicidal Thoughts:  No  Memory:  Immediate;   Good Recent;   Good Remote;   Fair  Judgement:  Fair  Insight:  Fair  Psychomotor Activity:  Normal  Concentration:  Concentration: Good and Attention Span: Good  Recall:  Good  Fund of Knowledge:  Good  Language:  Good  Akathisia:  No  Handed:  Right  AIMS (if indicated):     Assets:  Communication Skills Desire for Improvement Housing Resilience Social Support Transportation  ADL's:  Intact  Cognition:  WNL  Sleep:           Has this patient used any form of tobacco in the last 30 days? (Cigarettes, Smokeless Tobacco, Cigars, and/or Pipes) Yes, No  Blood Alcohol level:  Lab Results  Component Value Date   ETH <5 08/03/2016  ETH <5 16/12/9602    Metabolic Disorder Labs:  Lab Results  Component Value Date   HGBA1C 5.6 05/27/2016   MPG 114 05/27/2016   MPG 123 04/05/2016   Lab Results  Component Value Date   PROLACTIN 6.5 05/27/2016   Lab Results  Component Value Date   CHOL 112 08/03/2016   TRIG 180 (H) 08/03/2016   HDL 32 (L) 08/03/2016   CHOLHDL 3.5 08/03/2016   VLDL 36 08/03/2016   LDLCALC 44 08/03/2016   LDLCALC 118 (H) 05/27/2016    See Psychiatric Specialty Exam and Suicide Risk Assessment completed by Attending Physician prior to discharge.  Discharge destination:  Home  Is patient on multiple antipsychotic therapies at discharge:  No   Has Patient had three or more failed trials of antipsychotic monotherapy by history:  No  Recommended Plan for Multiple Antipsychotic Therapies: NA   Allergies as of 08/03/2016      Reactions   Prednisone Other (See Comments)   Hypertension, makes him feel spacey   Hydrocodone Other (See  Comments)   Headache, irritable   Varenicline Other (See Comments)   Suicidal thoughts      Medication List    STOP taking these medications   cephALEXin 500 MG capsule Commonly known as:  KEFLEX   cyclobenzaprine 7.5 MG tablet Commonly known as:  FEXMID   doxepin 150 MG capsule Commonly known as:  SINEQUAN   meloxicam 7.5 MG tablet Commonly known as:  MOBIC   risperiDONE 0.5 MG tablet Commonly known as:  RISPERDAL   rOPINIRole 0.25 MG tablet Commonly known as:  REQUIP     TAKE these medications     Indication  asenapine 5 MG Subl 24 hr tablet Commonly known as:  SAPHRIS Place 2 tablets (10 mg total) under the tongue 2 (two) times daily.  Indication:  Manic-Depression   aspirin 81 MG EC tablet Take 1 tablet (81 mg total) by mouth daily.  Indication:  Inflammation   atorvastatin 80 MG tablet Commonly known as:  LIPITOR Take 80 mg by mouth every evening.  Indication:  Ischemic Heart Disease   carvedilol 6.25 MG tablet Commonly known as:  COREG Take 6.25 mg by mouth 2 (two) times daily.  Indication:  High Blood Pressure of Unknown Cause   citalopram 20 MG tablet Commonly known as:  CELEXA Take 1 tablet (20 mg total) by mouth daily. Start taking on:  08/04/2016  Indication:  Depression   clopidogrel 75 MG tablet Commonly known as:  PLAVIX Take 75 mg by mouth daily.  Indication:  Acute Coronary Syndrome   gabapentin 800 MG tablet Commonly known as:  NEURONTIN Take 800 mg by mouth 4 (four) times daily.  Indication:  Agitation   hydrOXYzine 25 MG tablet Commonly known as:  ATARAX/VISTARIL Take 1 tablet (25 mg total) by mouth every 6 (six) hours as needed for anxiety. What changed:  when to take this  Indication:  Anxiety Neurosis   lisinopril 5 MG tablet Commonly known as:  PRINIVIL,ZESTRIL Take 5 mg by mouth daily.  Indication:  High Blood Pressure Disorder   lithium carbonate 300 MG capsule Take 300 mg by mouth daily.  Indication:   Manic-Depression   mirtazapine 45 MG tablet Commonly known as:  REMERON Take 1 tablet (45 mg total) by mouth at bedtime.  Indication:  Major Depressive Disorder   nicotine 21 mg/24hr patch Commonly known as:  NICODERM CQ - dosed in mg/24 hours Place 1 patch (21 mg total) onto the skin  daily at 6 (six) AM. Start taking on:  08/04/2016  Indication:  Nicotine Addiction   nitroGLYCERIN 0.4 MG SL tablet Commonly known as:  NITROSTAT Place 0.4 mg under the tongue every 5 (five) minutes x 3 doses as needed.  Indication:  Acute Angina Pectoris   ticagrelor 90 MG Tabs tablet Commonly known as:  BRILINTA Take 1 tablet (90 mg total) by mouth 2 (two) times daily.  Indication:  Acute Coronary Syndrome   tiZANidine 2 MG tablet Commonly known as:  ZANAFLEX Take 2 mg by mouth every 8 (eight) hours as needed for muscle spasms.  Indication:  Muscle Spasticity   traZODone 100 MG tablet Commonly known as:  DESYREL Take 1 tablet (100 mg total) by mouth at bedtime as needed for sleep.  Indication:  Trouble Sleeping        Follow-up recommendations:  Activity:  as tolerated Diet:  regular Other:  Follow up with outpatient resources as provided Take all medications as prescribed  Comments:  Pt will see Dr Weber Cooks on Monday at [facility] for evaluation of need for ECT.   Signed: Ethelene Hal, NP 08/03/2016, 10:51 AM

## 2016-08-04 LAB — HEMOGLOBIN A1C
Hgb A1c MFr Bld: 7.7 % — ABNORMAL HIGH (ref 4.8–5.6)
MEAN PLASMA GLUCOSE: 174 mg/dL

## 2016-08-10 ENCOUNTER — Ambulatory Visit (INDEPENDENT_AMBULATORY_CARE_PROVIDER_SITE_OTHER): Payer: 59 | Admitting: Psychiatry

## 2016-08-10 ENCOUNTER — Encounter: Payer: Self-pay | Admitting: Psychiatry

## 2016-08-10 VITALS — BP 153/93 | HR 108 | Temp 98.3°F | Wt 191.8 lb

## 2016-08-10 DIAGNOSIS — F339 Major depressive disorder, recurrent, unspecified: Secondary | ICD-10-CM | POA: Diagnosis not present

## 2016-08-10 NOTE — Progress Notes (Signed)
ECT consult for this 46 year old man with a history of recurrent depression and anxiety. Patient known to me from previous encounters. He comes in today requesting ECT treatment. He complains of chronic depressed mood but mostly severe anxiety. Feels so bad all the time that he can't leave the house. Sleep and appetite are poor. Talks about having passive suicidal thoughts without any intention or plan. He is right now not seeing an outpatient psychiatrist having been discontinued from Dr. Lilla Shook office. He claims he is not drinking or using any drugs.  Social history: He finally got Medicare approved I believe. Staying with his sister. He has children but has little activity and doesn't get out of the house much.  Substance abuse history: Has a history of alcohol and prescription drug abuse claims he is not using anything recently.  Past psychiatric history: Multiple previous hospitalizations multiple presentations to the emergency room. Patient typically presents with a strong focus on getting more controlled substances. He even tried to ask me today a 5 would give him "something" for his nerves. We have tried to do ECT once a few years ago with him. Only got one treatment done before he was discharged and he never followed up with outpatient treatment. Ever since he has been convinced that ECT was going to be a great benefit for him.  Neatly dressed. Generally appropriate interaction. Blunted anxious affect. Thoughts lucid no sign of thought disorder or loosening of associations. Talks about how if he doesn't get better he's just going to kill himself but when confronted about this denies any intention to harm himself now. No evidence of psychosis.  Patient with chronic anxiety chronic substance abuse and complaints of depression. I have long been ambivalent about how likely it is that ECT would be helpful for this gentleman. He's been on a large number of antidepressants mood stabilizers and  antipsychotics none of them with any real benefit and the only ones he really seems to like are the benzodiazepines. I presented him with my skepticism that ECT would be helpful and told him that I thought that the likelihood was probably below 50%. Patient nevertheless is strongly in favor of getting ECT treatment even when I emphasized to him the risks of memory loss and reviewed with him that his history of coronary artery disease puts him at increased risk of heart arrhythmia or myocardial infarction.  I have made it clear to the patient that he absolutely must have a regular outpatient psychiatric provider prescribing his medicine and that we have evidence of that before we start ECT. Also made it clear to him that he must continue to not abuse drugs or alcohol during that time. Also made it clear that he absolutely must have transportation to and from his treatments. That said he still wants to get the lab studies done. I told him that the earliest we would be able to start would probably be towards the end of this month or early June. I will review the case with the ECT team and we will consider scheduling him if all the labs and EKG look good and he definitely has an outpatient provider. Patient is enthusiastically in favor of the plan.

## 2016-08-11 DIAGNOSIS — R0602 Shortness of breath: Secondary | ICD-10-CM | POA: Diagnosis not present

## 2016-08-11 DIAGNOSIS — G8929 Other chronic pain: Secondary | ICD-10-CM | POA: Diagnosis not present

## 2016-08-11 DIAGNOSIS — Z79899 Other long term (current) drug therapy: Secondary | ICD-10-CM | POA: Diagnosis not present

## 2016-08-11 DIAGNOSIS — M542 Cervicalgia: Secondary | ICD-10-CM | POA: Diagnosis not present

## 2016-08-11 DIAGNOSIS — I10 Essential (primary) hypertension: Secondary | ICD-10-CM | POA: Diagnosis not present

## 2016-08-11 DIAGNOSIS — E785 Hyperlipidemia, unspecified: Secondary | ICD-10-CM | POA: Diagnosis not present

## 2016-08-11 DIAGNOSIS — Z5181 Encounter for therapeutic drug level monitoring: Secondary | ICD-10-CM | POA: Diagnosis not present

## 2016-08-13 DIAGNOSIS — J45998 Other asthma: Secondary | ICD-10-CM | POA: Diagnosis not present

## 2016-08-13 DIAGNOSIS — E119 Type 2 diabetes mellitus without complications: Secondary | ICD-10-CM | POA: Diagnosis not present

## 2016-08-13 DIAGNOSIS — I10 Essential (primary) hypertension: Secondary | ICD-10-CM | POA: Diagnosis not present

## 2016-08-18 ENCOUNTER — Inpatient Hospital Stay: Admission: RE | Admit: 2016-08-18 | Payer: Self-pay | Source: Ambulatory Visit

## 2016-08-24 DIAGNOSIS — Z7982 Long term (current) use of aspirin: Secondary | ICD-10-CM | POA: Diagnosis not present

## 2016-08-24 DIAGNOSIS — I252 Old myocardial infarction: Secondary | ICD-10-CM | POA: Diagnosis not present

## 2016-08-24 DIAGNOSIS — I2510 Atherosclerotic heart disease of native coronary artery without angina pectoris: Secondary | ICD-10-CM | POA: Diagnosis not present

## 2016-08-24 DIAGNOSIS — E1122 Type 2 diabetes mellitus with diabetic chronic kidney disease: Secondary | ICD-10-CM | POA: Diagnosis not present

## 2016-08-24 DIAGNOSIS — I129 Hypertensive chronic kidney disease with stage 1 through stage 4 chronic kidney disease, or unspecified chronic kidney disease: Secondary | ICD-10-CM | POA: Diagnosis not present

## 2016-08-24 DIAGNOSIS — N189 Chronic kidney disease, unspecified: Secondary | ICD-10-CM | POA: Diagnosis not present

## 2016-08-24 DIAGNOSIS — K219 Gastro-esophageal reflux disease without esophagitis: Secondary | ICD-10-CM | POA: Diagnosis not present

## 2016-08-24 DIAGNOSIS — I2109 ST elevation (STEMI) myocardial infarction involving other coronary artery of anterior wall: Secondary | ICD-10-CM | POA: Diagnosis not present

## 2016-08-24 DIAGNOSIS — R9431 Abnormal electrocardiogram [ECG] [EKG]: Secondary | ICD-10-CM | POA: Diagnosis not present

## 2016-08-24 DIAGNOSIS — Z79899 Other long term (current) drug therapy: Secondary | ICD-10-CM | POA: Diagnosis not present

## 2016-08-25 ENCOUNTER — Inpatient Hospital Stay: Admission: RE | Admit: 2016-08-25 | Payer: Self-pay | Source: Ambulatory Visit

## 2016-08-26 DIAGNOSIS — E119 Type 2 diabetes mellitus without complications: Secondary | ICD-10-CM | POA: Diagnosis not present

## 2016-08-26 DIAGNOSIS — I10 Essential (primary) hypertension: Secondary | ICD-10-CM | POA: Diagnosis not present

## 2016-09-04 ENCOUNTER — Other Ambulatory Visit: Payer: Self-pay

## 2016-09-08 ENCOUNTER — Telehealth: Payer: Self-pay

## 2016-09-11 ENCOUNTER — Inpatient Hospital Stay
Admission: EM | Admit: 2016-09-11 | Discharge: 2016-09-15 | DRG: 885 | Disposition: A | Discharge status: Home / Self Care | Payer: Medicare Other | Source: Intra-hospital | Attending: Psychiatry | Admitting: Psychiatry

## 2016-09-11 ENCOUNTER — Emergency Department
Admission: EM | Admit: 2016-09-11 | Discharge: 2016-09-11 | Disposition: A | Discharge status: Other Acute Inpatient Hospital | Payer: Medicare Other | Attending: Emergency Medicine | Admitting: Emergency Medicine

## 2016-09-11 DIAGNOSIS — F333 Major depressive disorder, recurrent, severe with psychotic symptoms: Secondary | ICD-10-CM | POA: Insufficient documentation

## 2016-09-11 DIAGNOSIS — F1021 Alcohol dependence, in remission: Secondary | ICD-10-CM

## 2016-09-11 DIAGNOSIS — Z590 Homelessness: Secondary | ICD-10-CM

## 2016-09-11 DIAGNOSIS — I252 Old myocardial infarction: Secondary | ICD-10-CM

## 2016-09-11 DIAGNOSIS — Z7982 Long term (current) use of aspirin: Secondary | ICD-10-CM

## 2016-09-11 DIAGNOSIS — E1122 Type 2 diabetes mellitus with diabetic chronic kidney disease: Secondary | ICD-10-CM | POA: Diagnosis not present

## 2016-09-11 DIAGNOSIS — G8929 Other chronic pain: Secondary | ICD-10-CM | POA: Diagnosis present

## 2016-09-11 DIAGNOSIS — F17210 Nicotine dependence, cigarettes, uncomplicated: Secondary | ICD-10-CM | POA: Diagnosis not present

## 2016-09-11 DIAGNOSIS — Z7984 Long term (current) use of oral hypoglycemic drugs: Secondary | ICD-10-CM

## 2016-09-11 DIAGNOSIS — I129 Hypertensive chronic kidney disease with stage 1 through stage 4 chronic kidney disease, or unspecified chronic kidney disease: Secondary | ICD-10-CM | POA: Diagnosis present

## 2016-09-11 DIAGNOSIS — M545 Low back pain: Secondary | ICD-10-CM | POA: Diagnosis present

## 2016-09-11 DIAGNOSIS — Z62811 Personal history of psychological abuse in childhood: Secondary | ICD-10-CM | POA: Diagnosis present

## 2016-09-11 DIAGNOSIS — Z888 Allergy status to other drugs, medicaments and biological substances status: Secondary | ICD-10-CM

## 2016-09-11 DIAGNOSIS — K219 Gastro-esophageal reflux disease without esophagitis: Secondary | ICD-10-CM | POA: Diagnosis present

## 2016-09-11 DIAGNOSIS — R45851 Suicidal ideations: Secondary | ICD-10-CM | POA: Diagnosis not present

## 2016-09-11 DIAGNOSIS — I2510 Atherosclerotic heart disease of native coronary artery without angina pectoris: Secondary | ICD-10-CM | POA: Insufficient documentation

## 2016-09-11 DIAGNOSIS — Z7902 Long term (current) use of antithrombotics/antiplatelets: Secondary | ICD-10-CM | POA: Diagnosis not present

## 2016-09-11 DIAGNOSIS — Z62810 Personal history of physical and sexual abuse in childhood: Secondary | ICD-10-CM | POA: Diagnosis present

## 2016-09-11 DIAGNOSIS — I4901 Ventricular fibrillation: Secondary | ICD-10-CM | POA: Diagnosis present

## 2016-09-11 DIAGNOSIS — N189 Chronic kidney disease, unspecified: Secondary | ICD-10-CM | POA: Diagnosis present

## 2016-09-11 DIAGNOSIS — Z79899 Other long term (current) drug therapy: Secondary | ICD-10-CM | POA: Insufficient documentation

## 2016-09-11 DIAGNOSIS — F1620 Hallucinogen dependence, uncomplicated: Secondary | ICD-10-CM | POA: Diagnosis present

## 2016-09-11 DIAGNOSIS — G4700 Insomnia, unspecified: Secondary | ICD-10-CM | POA: Diagnosis present

## 2016-09-11 DIAGNOSIS — K760 Fatty (change of) liver, not elsewhere classified: Secondary | ICD-10-CM | POA: Diagnosis present

## 2016-09-11 DIAGNOSIS — Z818 Family history of other mental and behavioral disorders: Secondary | ICD-10-CM

## 2016-09-11 DIAGNOSIS — F322 Major depressive disorder, single episode, severe without psychotic features: Secondary | ICD-10-CM

## 2016-09-11 DIAGNOSIS — E782 Mixed hyperlipidemia: Secondary | ICD-10-CM | POA: Diagnosis present

## 2016-09-11 DIAGNOSIS — M47812 Spondylosis without myelopathy or radiculopathy, cervical region: Secondary | ICD-10-CM | POA: Diagnosis present

## 2016-09-11 DIAGNOSIS — F17290 Nicotine dependence, other tobacco product, uncomplicated: Secondary | ICD-10-CM | POA: Diagnosis present

## 2016-09-11 DIAGNOSIS — J45909 Unspecified asthma, uncomplicated: Secondary | ICD-10-CM | POA: Diagnosis present

## 2016-09-11 DIAGNOSIS — Z981 Arthrodesis status: Secondary | ICD-10-CM

## 2016-09-11 DIAGNOSIS — Z885 Allergy status to narcotic agent status: Secondary | ICD-10-CM

## 2016-09-11 DIAGNOSIS — I152 Hypertension secondary to endocrine disorders: Secondary | ICD-10-CM | POA: Diagnosis present

## 2016-09-11 DIAGNOSIS — M479 Spondylosis, unspecified: Secondary | ICD-10-CM | POA: Diagnosis present

## 2016-09-11 DIAGNOSIS — F1320 Sedative, hypnotic or anxiolytic dependence, uncomplicated: Secondary | ICD-10-CM | POA: Diagnosis present

## 2016-09-11 DIAGNOSIS — F17200 Nicotine dependence, unspecified, uncomplicated: Secondary | ICD-10-CM | POA: Diagnosis present

## 2016-09-11 DIAGNOSIS — F419 Anxiety disorder, unspecified: Secondary | ICD-10-CM | POA: Diagnosis present

## 2016-09-11 DIAGNOSIS — Z765 Malingerer [conscious simulation]: Secondary | ICD-10-CM

## 2016-09-11 DIAGNOSIS — Z955 Presence of coronary angioplasty implant and graft: Secondary | ICD-10-CM | POA: Insufficient documentation

## 2016-09-11 DIAGNOSIS — Z5181 Encounter for therapeutic drug level monitoring: Secondary | ICD-10-CM | POA: Diagnosis not present

## 2016-09-11 DIAGNOSIS — I10 Essential (primary) hypertension: Secondary | ICD-10-CM | POA: Diagnosis present

## 2016-09-11 LAB — CBC
HEMATOCRIT: 41.8 % (ref 40.0–52.0)
Hemoglobin: 14.3 g/dL (ref 13.0–18.0)
MCH: 29.6 pg (ref 26.0–34.0)
MCHC: 34.2 g/dL (ref 32.0–36.0)
MCV: 86.5 fL (ref 80.0–100.0)
PLATELETS: 189 10*3/uL (ref 150–440)
RBC: 4.83 MIL/uL (ref 4.40–5.90)
RDW: 14 % (ref 11.5–14.5)
WBC: 9.6 10*3/uL (ref 3.8–10.6)

## 2016-09-11 LAB — COMPREHENSIVE METABOLIC PANEL
ALK PHOS: 93 U/L (ref 38–126)
ALT: 15 U/L — ABNORMAL LOW (ref 17–63)
AST: 24 U/L (ref 15–41)
Albumin: 4.2 g/dL (ref 3.5–5.0)
Anion gap: 9 (ref 5–15)
BILIRUBIN TOTAL: 0.6 mg/dL (ref 0.3–1.2)
BUN: 8 mg/dL (ref 6–20)
CALCIUM: 9.1 mg/dL (ref 8.9–10.3)
CO2: 23 mmol/L (ref 22–32)
Chloride: 103 mmol/L (ref 101–111)
Creatinine, Ser: 0.92 mg/dL (ref 0.61–1.24)
GFR calc Af Amer: >60 mL/min (ref >60)
GLUCOSE: 248 mg/dL — AB (ref 65–99)
POTASSIUM: 4.1 mmol/L (ref 3.5–5.1)
Sodium: 135 mmol/L (ref 135–145)
TOTAL PROTEIN: 7.3 g/dL (ref 6.5–8.1)

## 2016-09-11 LAB — ACETAMINOPHEN LEVEL: Acetaminophen (Tylenol), Serum: <10 ug/mL — ABNORMAL LOW (ref 10–30)

## 2016-09-11 LAB — URINE DRUG SCREEN, QUALITATIVE (ARMC ONLY)
AMPHETAMINES, UR SCREEN: NOT DETECTED
BARBITURATES, UR SCREEN: NOT DETECTED
Benzodiazepine, Ur Scrn: NOT DETECTED
CANNABINOID 50 NG, UR ~~LOC~~: NOT DETECTED
Cocaine Metabolite,Ur ~~LOC~~: NOT DETECTED
MDMA (Ecstasy)Ur Screen: NOT DETECTED
Methadone Scn, Ur: NOT DETECTED
OPIATE, UR SCREEN: NOT DETECTED
PHENCYCLIDINE (PCP) UR S: NOT DETECTED
Tricyclic, Ur Screen: POSITIVE — AB

## 2016-09-11 LAB — ETHANOL

## 2016-09-11 LAB — SALICYLATE LEVEL: Salicylate Lvl: <7 mg/dL (ref 2.8–30.0)

## 2016-09-11 MED ORDER — ALUM & MAG HYDROXIDE-SIMETH 200-200-20 MG/5ML PO SUSP: 30.0000 mL | ORAL | Status: DC | PRN

## 2016-09-11 MED ORDER — ACETAMINOPHEN 325 MG PO TABS: 650.0000 mg | ORAL_TABLET | Freq: Four times a day (QID) | ORAL | Status: DC | PRN

## 2016-09-11 MED ORDER — LISINOPRIL 5 MG PO TABS
5.0000 mg | ORAL_TABLET | Freq: Every day | ORAL | Status: DC
  Administered 2016-09-12 – 2016-09-14 (×3): 5 mg via ORAL
  Filled 2016-09-11 (×4): qty 1

## 2016-09-11 MED ORDER — MIRTAZAPINE 15 MG PO TABS: 45.0000 mg | ORAL_TABLET | Freq: Every day | ORAL | Status: DC

## 2016-09-11 MED ORDER — CLOPIDOGREL BISULFATE 75 MG PO TABS
75.0000 mg | ORAL_TABLET | Freq: Every day | ORAL | Status: DC
  Administered 2016-09-12 – 2016-09-15 (×4): 75 mg via ORAL
  Filled 2016-09-11 (×4): qty 1

## 2016-09-11 MED ORDER — MAGNESIUM HYDROXIDE 400 MG/5ML PO SUSP: 30.0000 mL | Freq: Every day | ORAL | Status: DC | PRN

## 2016-09-11 MED ORDER — LORAZEPAM 2 MG PO TABS: 2.0000 mg | ORAL_TABLET | Freq: Once | ORAL | Status: DC

## 2016-09-11 MED ORDER — LITHIUM CARBONATE 300 MG PO CAPS: 300.0000 mg | ORAL_CAPSULE | Freq: Two times a day (BID) | ORAL | Status: DC

## 2016-09-11 MED ORDER — GABAPENTIN 400 MG PO CAPS
800.0000 mg | ORAL_CAPSULE | Freq: Four times a day (QID) | ORAL | Status: DC
  Administered 2016-09-11 – 2016-09-15 (×15): 800 mg via ORAL
  Filled 2016-09-11 (×15): qty 2

## 2016-09-11 MED ORDER — CITALOPRAM HYDROBROMIDE 20 MG PO TABS: 20.0000 mg | ORAL_TABLET | Freq: Every day | ORAL | Status: DC

## 2016-09-11 MED ORDER — TRAZODONE HCL 100 MG PO TABS: 100.0000 mg | ORAL_TABLET | Freq: Every day | ORAL | Status: DC

## 2016-09-11 MED ORDER — ASENAPINE MALEATE 5 MG SL SUBL
10.0000 mg | SUBLINGUAL_TABLET | Freq: Two times a day (BID) | SUBLINGUAL | Status: DC
  Administered 2016-09-11 – 2016-09-15 (×8): 10 mg via SUBLINGUAL
  Filled 2016-09-11 (×8): qty 2

## 2016-09-11 MED ORDER — ZIPRASIDONE MESYLATE 20 MG IM SOLR
INTRAMUSCULAR | Status: AC
  Administered 2016-09-11: 18:00:00
  Filled 2016-09-11: qty 20

## 2016-09-11 MED ORDER — LISINOPRIL 5 MG PO TABS: 5.0000 mg | ORAL_TABLET | Freq: Every day | ORAL | Status: DC

## 2016-09-11 MED ORDER — MIDAZOLAM HCL 5 MG/5ML IJ SOLN
INTRAMUSCULAR | Status: AC
  Administered 2016-09-11: 18:00:00
  Filled 2016-09-11: qty 5

## 2016-09-11 MED ORDER — CITALOPRAM HYDROBROMIDE 20 MG PO TABS
20.0000 mg | ORAL_TABLET | Freq: Every day | ORAL | Status: DC
  Administered 2016-09-12 – 2016-09-13 (×2): 20 mg via ORAL
  Filled 2016-09-11 (×2): qty 1

## 2016-09-11 MED ORDER — TICAGRELOR 90 MG PO TABS: 90.0000 mg | ORAL_TABLET | Freq: Two times a day (BID) | ORAL | Status: DC

## 2016-09-11 MED ORDER — NICOTINE 21 MG/24HR TD PT24
21.0000 mg | MEDICATED_PATCH | Freq: Every day | TRANSDERMAL | Status: DC
  Administered 2016-09-12 – 2016-09-15 (×5): 21 mg via TRANSDERMAL
  Filled 2016-09-11 (×4): qty 1

## 2016-09-11 MED ORDER — ASPIRIN EC 81 MG PO TBEC
81.0000 mg | DELAYED_RELEASE_TABLET | Freq: Every day | ORAL | Status: DC
  Administered 2016-09-12 – 2016-09-15 (×4): 81 mg via ORAL
  Filled 2016-09-11 (×4): qty 1

## 2016-09-11 MED ORDER — TICAGRELOR 90 MG PO TABS
90.0000 mg | ORAL_TABLET | Freq: Two times a day (BID) | ORAL | Status: DC
  Administered 2016-09-11 – 2016-09-13 (×4): 90 mg via ORAL
  Filled 2016-09-11 (×4): qty 1

## 2016-09-11 MED ORDER — CARVEDILOL 6.25 MG PO TABS: 6.2500 mg | ORAL_TABLET | Freq: Two times a day (BID) | ORAL | Status: DC

## 2016-09-11 MED ORDER — ATORVASTATIN CALCIUM 20 MG PO TABS
80.0000 mg | ORAL_TABLET | Freq: Every day | ORAL | Status: DC
  Administered 2016-09-11 – 2016-09-14 (×4): 80 mg via ORAL
  Filled 2016-09-11 (×4): qty 4

## 2016-09-11 MED ORDER — ASENAPINE MALEATE 5 MG SL SUBL: 10.0000 mg | SUBLINGUAL_TABLET | Freq: Two times a day (BID) | SUBLINGUAL | Status: DC

## 2016-09-11 MED ORDER — ASPIRIN EC 81 MG PO TBEC: 81.0000 mg | DELAYED_RELEASE_TABLET | Freq: Every day | ORAL | Status: DC

## 2016-09-11 MED ORDER — CLOPIDOGREL BISULFATE 75 MG PO TABS: 75.0000 mg | ORAL_TABLET | Freq: Every day | ORAL | Status: DC

## 2016-09-11 MED ORDER — LITHIUM CARBONATE 300 MG PO CAPS
300.0000 mg | ORAL_CAPSULE | Freq: Two times a day (BID) | ORAL | Status: DC
  Administered 2016-09-12: 300 mg via ORAL
  Filled 2016-09-11: qty 1

## 2016-09-11 MED ORDER — GABAPENTIN 400 MG PO CAPS: 800.0000 mg | ORAL_CAPSULE | Freq: Four times a day (QID) | ORAL | Status: DC

## 2016-09-11 MED ORDER — CARVEDILOL 6.25 MG PO TABS
6.2500 mg | ORAL_TABLET | Freq: Two times a day (BID) | ORAL | Status: DC
  Administered 2016-09-12 – 2016-09-15 (×7): 6.25 mg via ORAL
  Filled 2016-09-11 (×7): qty 1

## 2016-09-11 MED ORDER — ATORVASTATIN CALCIUM 20 MG PO TABS: 80.0000 mg | ORAL_TABLET | Freq: Every day | ORAL | Status: DC

## 2016-09-11 MED ORDER — TRAZODONE HCL 100 MG PO TABS
100.0000 mg | ORAL_TABLET | Freq: Every day | ORAL | Status: DC
  Administered 2016-09-11: 100 mg via ORAL
  Filled 2016-09-11: qty 1

## 2016-09-11 MED ORDER — MIRTAZAPINE 15 MG PO TABS
45.0000 mg | ORAL_TABLET | Freq: Every day | ORAL | Status: DC
  Administered 2016-09-11 – 2016-09-14 (×4): 45 mg via ORAL
  Filled 2016-09-11 (×4): qty 3

## 2016-09-11 MED ORDER — LORAZEPAM 2 MG PO TABS
ORAL_TABLET | ORAL | Status: AC
Start: 2016-09-11 — End: 2016-09-11
  Administered 2016-09-11: 2 mg
  Filled 2016-09-11: qty 1

## 2016-09-11 NOTE — ED Notes (Signed)
Pt sts that he does not feel calmer, requesting more medication. Pt ambulatory w/o issue.

## 2016-09-11 NOTE — BH Assessment (Signed)
Assessment Note  Michael Schmitt is an 46 y.o. male. Mr. Michael Schmitt arrived to the ED by way of the [facility] department, under IVC.  He reports that he contacted mobile crisis today after putting his head and his hand through the ceiling.  He states "I had a rope to do something".  He shared, "I feel this way every day, some days I can play it off, some days not". He reports that he has been feeling depressed for the last 3-4 months.  He states that he is being forced to move, and he is trying to get into a boarding house.  He reports symptoms of anxiety, but would not elaborate as to what symptoms he is having.  He denied having visual hallucinations, but reports that he hears voices telling him to kill himself.  He denied homicidal ideation or intent.  He reports that he has suicidal ideations currently.  He denied the use of alcohol or drugs.    Diagnosis: Depression, SI  Past Medical History:  Past Medical History:  Diagnosis Date  . Anxiety   . Anxiety   . Arthritis    cerv. stenosis, spondylosis, HNP- lower back , has been followed in pain clinic, has  had injection s in cerv. area  . Blood dyscrasia    told that when he was young he was a" free bleeder"  . CAD (coronary artery disease)   . Cervical spondylosis without myelopathy 07/24/2014  . Cervicogenic headache 07/24/2014  . Chronic kidney disease    renal calculi- passed spontaneously  . Depression   . Diabetes mellitus without complication ([place])   . Fatty liver   . GERD (gastroesophageal reflux disease)   . Headache(784.0)   . Hyperlipidemia   . Hypertension   . Mental disorder   . MI, old   . RLS (restless legs syndrome)    detected on sleep study  . Shortness of breath   . Ventricular fibrillation ([place]) 04/05/2016    Past Surgical History:  Procedure Laterality Date  . ANTERIOR CERVICAL DECOMP/DISCECTOMY FUSION  11/13/2011   Procedure: ANTERIOR CERVICAL DECOMPRESSION/DISCECTOMY FUSION 1 LEVEL;  Surgeon: [name], MD;   Location: [facility];  Service: Neurosurgery;  Laterality: N/A;  Anterior Cervical Decompression/discectomy Fusion. Cervical three-four.  Marland Kitchen CARDIAC CATHETERIZATION N/A 01/01/2015   Procedure: Left Heart Cath and Coronary Angiography;  Surgeon: [name], MD;  Location: [place] [facility];  Service: Cardiovascular;  Laterality: N/A;  . CARDIAC CATHETERIZATION N/A 04/05/2016   Procedure: Left Heart Cath and Coronary Angiography;  Surgeon: Belva Crome, MD;  Location: [place] [facility];  Service: Cardiovascular;  Laterality: N/A;  . CARDIAC CATHETERIZATION N/A 04/05/2016   Procedure: Coronary Stent Intervention;  Surgeon: Belva Crome, MD;  Location: [place] [facility];  Service: Cardiovascular;  Laterality: N/A;  . CARDIAC CATHETERIZATION N/A 04/05/2016   Procedure: Intravascular Ultrasound/IVUS;  Surgeon: Belva Crome, MD;  Location: [place] [facility];  Service: Cardiovascular;  Laterality: N/A;  . NASAL SINUS SURGERY     2005    Family History:  Family History  Problem Relation Age of Onset  . Prostate cancer Father   . Hypertension Mother   . Kidney Stones Mother   . Anxiety disorder Mother   . Depression Mother   . COPD Sister   . Hypertension Sister   . Diabetes Sister   . Depression Sister   . Anxiety disorder Sister   . Seizures Sister   . ADD /  ADHD Son   . ADD / ADHD Daughter     Social History:  reports that he has been smoking Cigarettes and Cigars.  He has a 34.00 pack-year smoking history. He has quit using smokeless tobacco. He reports that he does not drink alcohol or use drugs.  Additional Social History:  Alcohol / Drug Use History of alcohol / drug use?: No history of alcohol / drug abuse (Patient denies alcohol use in 4 months)  CIWA: CIWA-Ar BP: 119/79 Pulse Rate: 87 COWS:    Allergies:  Allergies  Allergen Reactions  . Prednisone Other (See Comments)    Hypertension, makes him feel spacey  . Hydrocodone Other (See Comments)    Headache,  irritable  . Varenicline Other (See Comments)    Suicidal thoughts    Home Medications:  Medications Prior to Admission  Medication Sig Dispense Refill  . asenapine (SAPHRIS) 5 MG SUBL 24 hr tablet Place 2 tablets (10 mg total) under the tongue 2 (two) times daily. 28 tablet 0  . aspirin 81 MG EC tablet Take 1 tablet (81 mg total) by mouth daily. 30 tablet 1  . atorvastatin (LIPITOR) 80 MG tablet Take 80 mg by mouth every evening.    . carvedilol (COREG) 6.25 MG tablet Take 6.25 mg by mouth 2 (two) times daily.    . citalopram (CELEXA) 20 MG tablet Take 1 tablet (20 mg total) by mouth daily. 14 tablet 0  . clopidogrel (PLAVIX) 75 MG tablet Take 75 mg by mouth daily.    Marland Kitchen gabapentin (NEURONTIN) 800 MG tablet Take 800 mg by mouth 4 (four) times daily.    . hydrOXYzine (ATARAX/VISTARIL) 25 MG tablet Take 1 tablet (25 mg total) by mouth every 6 (six) hours as needed for anxiety. 30 tablet 0  . lisinopril (PRINIVIL,ZESTRIL) 5 MG tablet Take 5 mg by mouth daily.    Marland Kitchen lithium carbonate 300 MG capsule Take 300 mg by mouth daily.    . mirtazapine (REMERON) 45 MG tablet Take 1 tablet (45 mg total) by mouth at bedtime. 14 tablet 0  . nicotine (NICODERM CQ - DOSED IN MG/24 HOURS) 21 mg/24hr patch Place 1 patch (21 mg total) onto the skin daily at 6 (six) AM. 28 patch 0  . nitroGLYCERIN (NITROSTAT) 0.4 MG SL tablet Place 0.4 mg under the tongue every 5 (five) minutes x 3 doses as needed.    . ticagrelor (BRILINTA) 90 MG TABS tablet Take 1 tablet (90 mg total) by mouth 2 (two) times daily. 60 tablet 1  . tiZANidine (ZANAFLEX) 2 MG tablet Take 2 mg by mouth every 8 (eight) hours as needed for muscle spasms.    . traZODone (DESYREL) 100 MG tablet Take 1 tablet (100 mg total) by mouth at bedtime as needed for sleep. 14 tablet 0    OB/GYN Status:  No LMP for male patient.  General Assessment Data Location of Assessment: [facility] ED TTS Assessment: In system Is this a Tele or Face-to-Face Assessment?:  Face-to-Face Is this an Initial Assessment or a Re-assessment for this encounter?: Initial Assessment Marital status: Widowed [place] name: n/a Is patient pregnant?: No Pregnancy Status: No Living Arrangements: Other relatives Can pt return to current living arrangement?: Yes Admission Status: Involuntary Is patient capable of signing voluntary admission?: Yes Referral Source: Self/Family/Friend Insurance type: [place] Screening Exam ([place]) Medical Exam completed: Yes  Crisis Care Plan Living Arrangements: Other relatives Legal Guardian: Other: (Self) Name of Psychiatrist: [facility] Name of Therapist: [facility]  Education Status Is patient currently in school?: No Current Grade: n/a Highest grade of school patient has completed: 11th Name of school: n/a Contact person: n/a  Risk to self with the past 6 months Suicidal Ideation: Yes-Currently Present Has patient been a risk to self within the past 6 months prior to admission? : Yes Suicidal Intent: Yes-Currently Present Has patient had any suicidal intent within the past 6 months prior to admission? : Yes Is patient at risk for suicide?: No (Currently in the hospital) Suicidal Plan?: Yes-Currently Present Has patient had any suicidal plan within the past 6 months prior to admission? : Yes Specify Current Suicidal Plan: Elbert Ewings himself Access to Means: No (Currently in the hospital) What has been your use of drugs/alcohol within the last 12 months?: Denied recent use of alcohol or drugs Previous Attempts/Gestures: Yes How many times?: 2 Other Self Harm Risks: DENIED Triggers for Past Attempts: Unknown Intentional Self Injurious Behavior: None Family Suicide History: Unknown Recent stressful life event(s): Other (Comment) (Housing) Persecutory voices/beliefs?: No Depression: Yes Depression Symptoms: Feeling worthless/self pity Substance abuse history and/or treatment for substance abuse?: Yes Suicide  prevention information given to non-admitted patients: Not applicable  Risk to Others within the past 6 months Homicidal Ideation: No Does patient have any lifetime risk of violence toward others beyond the six months prior to admission? : No Thoughts of Harm to Others: No Current Homicidal Intent: No Current Homicidal Plan: No Access to Homicidal Means: No Identified Victim: None identified History of harm to others?: Yes Assessment of Violence: None Noted Violent Behavior Description: None reported this visit Does patient have access to weapons?: No Criminal Charges Pending?: Yes Describe Pending Criminal Charges: DWI, intoxicated and disruptive, license revoked, open container Does patient have a court date: Yes Court Date: 10/02/16 (11/24/2016) Is patient on probation?: Unknown  Psychosis Hallucinations: Auditory, With command Delusions: None noted  Mental Status Report Appearance/Hygiene: In scrubs Eye Contact: Poor Motor Activity: Unremarkable Speech: Logical/coherent Level of Consciousness: Drowsy Mood: Depressed Affect: Irritable Anxiety Level: Minimal Thought Processes: Coherent Judgement: Unable to Assess Orientation: Place, Time, Situation Obsessive Compulsive Thoughts/Behaviors: None  Cognitive Functioning Concentration: Fair Memory: Recent Intact IQ: Average Insight: Poor Impulse Control: Poor Appetite: Fair Sleep: No Change Vegetative Symptoms: None  ADLScreening [facility] Assessment Services) Patient's cognitive ability adequate to safely complete daily activities?: Yes Patient able to express need for assistance with ADLs?: Yes Independently performs ADLs?: Yes (appropriate for developmental age)  Prior Inpatient Therapy Prior Inpatient Therapy: Yes Prior Therapy Dates: 2018 and prior Prior Therapy Facilty/Provider(s): [place], [place], [place] Reason for Treatment: Depression  Prior Outpatient Therapy Prior Outpatient Therapy: Yes Prior  Therapy Dates: Current Prior Therapy Facilty/Provider(s): [facility] Reason for Treatment: Depression Does patient have an ACCT team?: No Does patient have Intensive In-House Services?  : No Does patient have [facility] services? : No Does patient have P4CC services?: No  ADL Screening (condition at time of admission) Patient's cognitive ability adequate to safely complete daily activities?: Yes Is the patient deaf or have difficulty hearing?: No Does the patient have difficulty seeing, even when wearing glasses/contacts?: No Does the patient have difficulty concentrating, remembering, or making decisions?: No Patient able to express need for assistance with ADLs?: Yes Does the patient have difficulty dressing or bathing?: No Independently performs ADLs?: Yes (appropriate for developmental age) Does the patient have difficulty walking or climbing stairs?: No Weakness of Legs: None Weakness of Arms/Hands: None  Home Assistive Devices/Equipment Home Assistive Devices/Equipment: None  Therapy Consults (  therapy consults require a physician order) PT Evaluation Needed: No OT Evalulation Needed: No SLP Evaluation Needed: No Abuse/Neglect Assessment (Assessment to be complete while patient is alone) Physical Abuse: Denies Verbal Abuse: Denies Sexual Abuse: Denies Exploitation of patient/patient's resources: Denies Self-Neglect: Denies Values / Beliefs Cultural Requests During Hospitalization: None Spiritual Requests During Hospitalization: None Consults Spiritual Care Consult Needed: No Social Work Consult Needed: No Regulatory affairs officer (For Healthcare) Does Patient Have a Medical Advance Directive?: No Would patient like information on creating a medical advance directive?: No - Patient declined Nutrition Screen- [facility] Adult/[facility]/AP Patient's home diet: Carb modified Has the patient recently lost weight without trying?: No Has the patient been eating poorly because of a decreased appetite?:  No Malnutrition Screening Tool Score: 0  Additional Information 1:1 In Past 12 Months?: No CIRT Risk: No Elopement Risk: No Does patient have medical clearance?: Yes     Disposition:  Disposition Initial Assessment Completed for this Encounter: Yes Disposition of Patient: Inpatient treatment program Type of inpatient treatment program: Adult  On Site Evaluation by:   Reviewed with Physician:    Elmer Bales 09/11/2016 10:59 PM

## 2016-09-11 NOTE — ED Notes (Signed)
Pt out to ODS station for remote

## 2016-09-11 NOTE — ED Notes (Signed)
Report given [facility] in [facility] unit

## 2016-09-11 NOTE — Tx Team (Signed)
Initial Treatment Plan 09/11/2016 9:18 PM Michael Schmitt ZPH:[id number]    PATIENT STRESSORS: Financial difficulties Health problems Marital or family conflict   PATIENT STRENGTHS: Ability for insight Average or above average intelligence Capable of independent living Communication skills Supportive family/friends   PATIENT IDENTIFIED PROBLEMS: "Anxiety"  "Stress in life"  "Voices"  Depression  Suicidal thoughts             DISCHARGE CRITERIA:  Ability to meet basic life and health needs Adequate post-discharge living arrangements Verbal commitment to aftercare and medication compliance  PRELIMINARY DISCHARGE PLAN: Attend aftercare/continuing care group Outpatient therapy Placement in alternative living arrangements  PATIENT/FAMILY INVOLVEMENT: This treatment plan has been presented to and reviewed with the patient, Michael Schmitt, and/or family member.  The patient and family have been given the opportunity to ask questions and make suggestions.  Vela Prose, RN 09/11/2016, 9:18 PM

## 2016-09-11 NOTE — Consult Note (Signed)
[facility] Consult   Reason for Consult:  Consult for 46 year old man with long-standing problems with depression and anxiety now saying he is actively suicidal Referring Physician:  Karma Greaser Patient Identification: Michael Schmitt MRN:  [id number] Principal Diagnosis: Major depressive disorder, recurrent, severe with psychotic features [facility]) Diagnosis:   Patient Active Problem List   Diagnosis Date Noted  . Major depressive disorder, recurrent, severe with psychotic features ([place]) [F33.3] 08/02/2016  . HTN (hypertension) [I10] 06/26/2016  . GERD (gastroesophageal reflux disease) [K21.9] 06/26/2016  . Alcohol use disorder, severe, 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[place]) [F10.21] 06/26/2016  . Cellulitis [L03.90] 06/26/2016  . Phencyclidine (PCP) use disorder, severe, dependence ([place]) [F16.20] 05/28/2016  . Sedative, hypnotic or anxiolytic use disorder, severe, dependence ([place]) [F13.20] 05/12/2016  . Ventricular fibrillation ([place]) [I49.01] 04/05/2016  . 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 History of ST elevation myocardial infarction (STEMI) [I25.2] 01/05/2016  . Major depressive disorder, recurrent episode, moderate with anxious distress ([place]) [F33.1] 06/21/2015  . Cervical spondylosis without myelopathy [M47.812] 07/24/2014  . Tobacco use disorder [F17.200] 10/20/2010  . Asthma [J45.909] 10/20/2010    Total Time spent with patient: 45 minutes  Subjective:   Michael Schmitt is a 46 y.o. male patient admitted with "I just want to die".  HPI:  Patient seen chart reviewed. Patient well known to me from many prior encounters. 46 year old man presents to the emergency room after supposedly threatening to hang himself today. Patient says he hung a noose in his yard and put his neck and it. He was just discharged from [facility] within the last week. Says he remains compliant with his medicine. Mood is still very depressed. Patient presents as anxious and agitated.  Claims to be having hallucinations. Denies homicidal ideation. Denies that he's been abusing any drugs or alcohol.  Social history: Patient lives with some extended family. Sister is supportive at times.  Medical history: Multiple medical problems including hypertension dyslipidemia  Substance abuse history: Long history of abuse of benzodiazepines  Past Psychiatric History: Patient has a history of depression and anxiety and multiple presentations to the emergency room. Usually seeking benzodiazepines in the past. Patient has had multiple hospitalizations. Usually presents very histrionic.  Risk to Self: Is patient at risk for suicide?: Yes Risk to Others:   Prior Inpatient Therapy:   Prior Outpatient Therapy:    Past Medical History:  Past Medical History:  Diagnosis Date  . Anxiety   . Anxiety   . Arthritis    cerv. stenosis, spondylosis, HNP- lower back , has been followed in pain clinic, has  had injection s in cerv. area  . Blood dyscrasia    told that when he was young he was a" free bleeder"  . CAD (coronary artery disease)   . Cervical spondylosis without myelopathy 07/24/2014  . Cervicogenic headache 07/24/2014  . Chronic kidney disease    renal calculi- passed spontaneously  . Depression   . Diabetes mellitus without complication ([place])   . Fatty liver   . GERD (gastroesophageal reflux disease)   . Headache(784.0)   . Hyperlipidemia   . Hypertension   . Mental disorder   . MI, old   . RLS (restless legs syndrome)    detected on sleep study  . Shortness of breath   . Ventricular fibrillation ([place]) 04/05/2016    Past Surgical History:  Procedure Laterality Date  . ANTERIOR CERVICAL DECOMP/DISCECTOMY FUSION  11/13/2011   Procedure: ANTERIOR CERVICAL DECOMPRESSION/DISCECTOMY FUSION 1 LEVEL;  Surgeon: Dominica Severin  Levy Sjogren, MD;  Location: [facility];  Service: Neurosurgery;  Laterality: N/A;  Anterior Cervical Decompression/discectomy Fusion. Cervical three-four.  Marland Kitchen CARDIAC  CATHETERIZATION N/A 01/01/2015   Procedure: Left Heart Cath and Coronary Angiography;  Surgeon: [name], MD;  Location: [facility];  Service: Cardiovascular;  Laterality: N/A;  . CARDIAC CATHETERIZATION N/A 04/05/2016   Procedure: Left Heart Cath and Coronary Angiography;  Surgeon: Belva Crome, MD;  Location: [facility];  Service: Cardiovascular;  Laterality: N/A;  . CARDIAC CATHETERIZATION N/A 04/05/2016   Procedure: Coronary Stent Intervention;  Surgeon: Belva Crome, MD;  Location: [facility];  Service: Cardiovascular;  Laterality: N/A;  . CARDIAC CATHETERIZATION N/A 04/05/2016   Procedure: Intravascular Ultrasound/IVUS;  Surgeon: Belva Crome, MD;  Location: [facility];  Service: Cardiovascular;  Laterality: N/A;  . NASAL SINUS SURGERY     2005   Family History:  Family History  Problem Relation Age of Onset  . Prostate cancer Father   . Hypertension Mother   . Kidney Stones Mother   . Anxiety disorder Mother   . Depression Mother   . COPD Sister   . Hypertension Sister   . Diabetes Sister   . Depression Sister   . Anxiety disorder Sister   . Seizures Sister   . ADD / ADHD Son   . ADD / ADHD Daughter    Family Psychiatric  History: Positive for anxiety Social History:  History  Alcohol Use No    Comment: last drinked in 3 months.      History  Drug Use No    Social History   Social History  . Marital status: Widowed    Spouse name: N/A  . Number of children: 3  . Years of education: 12   Occupational History  .      unemployed   Social History Main Topics  . Smoking status: Current Every Day Smoker    Packs/day: 2.00    Years: 17.00    Types: Cigarettes, Cigars  . Smokeless tobacco: Former Systems developer     Comment: occassional snuff   . Alcohol use No     Comment: last drinked in 3 months.   . Drug use: No  . Sexual activity: Not Currently   Other Topics Concern  . None   Social History Narrative   Patient is right  handed.   Patient drinks 2 sodas daily.   Additional Social History:    Allergies:   Allergies  Allergen Reactions  . Prednisone Other (See Comments)    Hypertension, makes him feel spacey  . Hydrocodone Other (See Comments)    Headache, irritable  . Varenicline Other (See Comments)    Suicidal thoughts    Labs:  Results for orders placed or performed during the hospital encounter of 09/11/16 (from the past 48 hour(s))  Comprehensive metabolic panel     Status: Abnormal   Collection Time: 09/11/16  3:01 PM  Result Value Ref Range   Sodium 135 135 - 145 mmol/L   Potassium 4.1 3.5 - 5.1 mmol/L   Chloride 103 101 - 111 mmol/L   CO2 23 22 - 32 mmol/L   Glucose, Bld 248 (H) 65 - 99 mg/dL   BUN 8 6 - 20 mg/dL   Creatinine, Ser 0.92 0.61 - 1.24 mg/dL   Calcium 9.1 8.9 - 10.3 mg/dL   Total Protein 7.3 6.5 - 8.1 g/dL   Albumin 4.2 3.5 - 5.0 g/dL  AST 24 15 - 41 U/L   ALT 15 (L) 17 - 63 U/L   Alkaline Phosphatase 93 38 - 126 U/L   Total Bilirubin 0.6 0.3 - 1.2 mg/dL   GFR calc non Af Amer >60 >60 mL/min   GFR calc Af Amer >60 >60 mL/min    Comment: (NOTE) The eGFR has been calculated using the CKD EPI equation. This calculation has not been validated in all clinical situations. eGFR's persistently <60 mL/min signify possible Chronic Kidney Disease.    Anion gap 9 5 - 15  Ethanol     Status: None   Collection Time: 09/11/16  3:01 PM  Result Value Ref Range   Alcohol, Ethyl (B) <5 <5 mg/dL    Comment:        LOWEST DETECTABLE LIMIT FOR SERUM ALCOHOL IS 5 mg/dL FOR MEDICAL PURPOSES ONLY   Salicylate level     Status: None   Collection Time: 09/11/16  3:01 PM  Result Value Ref Range   Salicylate Lvl <2.4 2.8 - 30.0 mg/dL  Acetaminophen level     Status: Abnormal   Collection Time: 09/11/16  3:01 PM  Result Value Ref Range   Acetaminophen (Tylenol), Serum <10 (L) 10 - 30 ug/mL    Comment:        THERAPEUTIC CONCENTRATIONS VARY SIGNIFICANTLY. A RANGE OF 10-30 ug/mL  MAY BE AN EFFECTIVE CONCENTRATION FOR MANY PATIENTS. HOWEVER, SOME ARE BEST TREATED AT CONCENTRATIONS OUTSIDE THIS RANGE. ACETAMINOPHEN CONCENTRATIONS >150 ug/mL AT 4 HOURS AFTER INGESTION AND >50 ug/mL AT 12 HOURS AFTER INGESTION ARE OFTEN ASSOCIATED WITH TOXIC REACTIONS.   cbc     Status: None   Collection Time: 09/11/16  3:01 PM  Result Value Ref Range   WBC 9.6 3.8 - 10.6 K/uL   RBC 4.83 4.40 - 5.90 MIL/uL   Hemoglobin 14.3 13.0 - 18.0 g/dL   HCT 41.8 40.0 - 52.0 %   MCV 86.5 80.0 - 100.0 fL   MCH 29.6 26.0 - 34.0 pg   MCHC 34.2 32.0 - 36.0 g/dL   RDW 14.0 11.5 - 14.5 %   Platelets 189 150 - 440 K/uL  Urine Drug Screen, Qualitative     Status: Abnormal   Collection Time: 09/11/16  3:01 PM  Result Value Ref Range   Tricyclic, Ur Screen POSITIVE (A) NONE DETECTED   Amphetamines, Ur Screen NONE DETECTED NONE DETECTED   MDMA (Ecstasy)Ur Screen NONE DETECTED NONE DETECTED   Cocaine Metabolite,Ur [place] NONE DETECTED NONE DETECTED   Opiate, Ur Screen NONE DETECTED NONE DETECTED   Phencyclidine (PCP) Ur S NONE DETECTED NONE DETECTED   Cannabinoid 50 Ng, Ur [place] NONE DETECTED NONE DETECTED   Barbiturates, Ur Screen NONE DETECTED NONE DETECTED   Benzodiazepine, Ur Scrn NONE DETECTED NONE DETECTED   Methadone Scn, Ur NONE DETECTED NONE DETECTED    Comment: (NOTE) 825  Tricyclics, urine               Cutoff 1000 ng/mL 200  Amphetamines, urine             Cutoff 1000 ng/mL 300  MDMA (Ecstasy), urine           Cutoff 500 ng/mL 400  Cocaine Metabolite, urine       Cutoff 300 ng/mL 500  Opiate, urine                   Cutoff 300 ng/mL 600  Phencyclidine (PCP), urine      Cutoff 25  ng/mL 700  Cannabinoid, urine              Cutoff 50 ng/mL 800  Barbiturates, urine             Cutoff 200 ng/mL 900  Benzodiazepine, urine           Cutoff 200 ng/mL 1000 Methadone, urine                Cutoff 300 ng/mL 1100 1200 The urine drug screen provides only a preliminary, unconfirmed 1300  analytical test result and should not be used for non-medical 1400 purposes. Clinical consideration and professional judgment should 1500 be applied to any positive drug screen result due to possible 1600 interfering substances. A more specific alternate chemical method 1700 must be used in order to obtain a confirmed analytical result.  1800 Gas chromato graphy / mass spectrometry (GC/MS) is the preferred 1900 confirmatory method.     Current Facility-Administered Medications  Medication Dose Route Frequency Provider Last Rate Last Dose  . asenapine (SAPHRIS) sublingual tablet 10 mg  10 mg Sublingual BID Dell Hurtubise, Madie Reno, MD      . aspirin EC tablet 81 mg  81 mg Oral Daily Daemian Gahm, Madie Reno, MD      . Derrill Memo ON 09/12/2016] atorvastatin (LIPITOR) tablet 80 mg  80 mg Oral q1800 Adriene Knipfer T, MD      . Derrill Memo ON 09/12/2016] carvedilol (COREG) tablet 6.25 mg  6.25 mg Oral BID WC Cedrick Partain T, MD      . citalopram (CELEXA) tablet 20 mg  20 mg Oral Daily Naphtali Zywicki T, MD      . clopidogrel (PLAVIX) tablet 75 mg  75 mg Oral Daily Woodson Macha T, MD      . gabapentin (NEURONTIN) capsule 800 mg  800 mg Oral QID Shelly Shoultz T, MD      . lisinopril (PRINIVIL,ZESTRIL) tablet 5 mg  5 mg Oral Daily Diedra Sinor T, MD      . Derrill Memo ON 09/12/2016] lithium carbonate capsule 300 mg  300 mg Oral BID WC Rawlins Stuard T, MD      . mirtazapine (REMERON) tablet 45 mg  45 mg Oral QHS Parvin Stetzer T, MD      . ticagrelor (BRILINTA) tablet 90 mg  90 mg Oral BID Obinna Ehresman T, MD      . traZODone (DESYREL) tablet 100 mg  100 mg Oral QHS Clayburn Weekly, Madie Reno, MD       Current Outpatient Prescriptions  Medication Sig Dispense Refill  . asenapine (SAPHRIS) 5 MG SUBL 24 hr tablet Place 2 tablets (10 mg total) under the tongue 2 (two) times daily. 28 tablet 0  . aspirin 81 MG EC tablet Take 1 tablet (81 mg total) by mouth daily. 30 tablet 1  . atorvastatin (LIPITOR) 80 MG tablet Take 80 mg by mouth every  evening.    . carvedilol (COREG) 6.25 MG tablet Take 6.25 mg by mouth 2 (two) times daily.    . citalopram (CELEXA) 20 MG tablet Take 1 tablet (20 mg total) by mouth daily. 14 tablet 0  . clopidogrel (PLAVIX) 75 MG tablet Take 75 mg by mouth daily.    Marland Kitchen gabapentin (NEURONTIN) 800 MG tablet Take 800 mg by mouth 4 (four) times daily.    . hydrOXYzine (ATARAX/VISTARIL) 25 MG tablet Take 1 tablet (25 mg total) by mouth every 6 (six) hours as needed for anxiety. 30 tablet 0  . lisinopril (PRINIVIL,ZESTRIL) 5 MG  tablet Take 5 mg by mouth daily.    Marland Kitchen lithium carbonate 300 MG capsule Take 300 mg by mouth daily.    . mirtazapine (REMERON) 45 MG tablet Take 1 tablet (45 mg total) by mouth at bedtime. 14 tablet 0  . nicotine (NICODERM CQ - DOSED IN MG/24 HOURS) 21 mg/24hr patch Place 1 patch (21 mg total) onto the skin daily at 6 (six) AM. 28 patch 0  . nitroGLYCERIN (NITROSTAT) 0.4 MG SL tablet Place 0.4 mg under the tongue every 5 (five) minutes x 3 doses as needed.    . ticagrelor (BRILINTA) 90 MG TABS tablet Take 1 tablet (90 mg total) by mouth 2 (two) times daily. 60 tablet 1  . tiZANidine (ZANAFLEX) 2 MG tablet Take 2 mg by mouth every 8 (eight) hours as needed for muscle spasms.    . traZODone (DESYREL) 100 MG tablet Take 1 tablet (100 mg total) by mouth at bedtime as needed for sleep. 14 tablet 0    Musculoskeletal: Strength & Muscle Tone: within normal limits Gait & Station: normal Patient leans: N/A  Psychiatric Specialty Exam: Physical Exam  Nursing note and vitals reviewed. Constitutional: He appears well-developed and well-nourished.  HENT:  Head: Normocephalic and atraumatic.  Eyes: Conjunctivae are normal. Pupils are equal, round, and reactive to light.  Neck: Normal range of motion.  Cardiovascular: Regular rhythm and normal heart sounds.   Respiratory: Effort normal. No respiratory distress.  GI: Soft.  Musculoskeletal: Normal range of motion.  Neurological: He is alert.   Skin: Skin is warm and dry.  Psychiatric: His mood appears anxious. His speech is tangential. He is agitated. Cognition and memory are impaired. He expresses impulsivity. He exhibits a depressed mood. He expresses suicidal ideation. He exhibits abnormal recent memory.    Review of Systems  Constitutional: Negative.   HENT: Negative.   Eyes: Negative.   Respiratory: Negative.   Cardiovascular: Negative.   Gastrointestinal: Negative.   Musculoskeletal: Negative.   Skin: Negative.   Neurological: Negative.   Psychiatric/Behavioral: Positive for depression, hallucinations and suicidal ideas. Negative for memory loss and substance abuse. The patient is nervous/anxious and has insomnia.     Blood pressure (!) 144/97, pulse (!) 127, temperature 98.6 F (37 C), temperature source Oral, resp. rate (!) 22, height _0  (1.88 m), weight 88.5 kg (195 lb), SpO2 96 %.Body mass index is 25.04 kg/m.  General Appearance: Casual  Eye Contact:  Minimal  Speech:  Pressured  Volume:  Normal  Mood:  Anxious, Depressed and Dysphoric  Affect:  Depressed  Thought Process:  Disorganized  Orientation:  Full (Time, Place, and Person)  Thought Content:  Rumination and Tangential  Suicidal Thoughts:  Yes.  with intent/plan  Homicidal Thoughts:  No  Memory:  Immediate;   Fair Recent;   Fair Remote;   Fair  Judgement:  Impaired  Insight:  Shallow  Psychomotor Activity:  Decreased  Concentration:  Concentration: Poor  Recall:  Poor  Fund of Knowledge:  Fair  Language:  Fair  Akathisia:  No  Handed:  Right  AIMS (if indicated):     Assets:  Housing Social Support  ADL's:  Intact  Cognition:  WNL  Sleep:        Treatment Plan Summary: Daily contact with patient to assess and evaluate symptoms and progress in treatment, Medication management and Plan 46 year old man with a history of depression and anxiety claiming that he was going to hang himself today. Patient will be admitted to the  psychiatric  unit. 15 minute checks in place. Full set of labs. Continue outpatient medicine. Patient is once again asking for ECT. I advised him that at this point that may be impractical as I will be out of the office next week but I will be happy to consider it along with his treatment team. Orders completed case reviewed with emergency room physician and TTS  Disposition: Recommend psychiatric Inpatient admission when medically cleared. Supportive therapy provided about ongoing stressors.  Alethia Berthold, MD 09/11/2016 6:43 PM

## 2016-09-11 NOTE — ED Notes (Signed)
Pts sister and mother given pts passcode w/ pts permission.

## 2016-09-11 NOTE — ED Triage Notes (Signed)
Pt brought in under IVC with Officer Moore of [place] [employer]. Pt hung rope outside today, was going to hang himself. Pt states that he wants to die.

## 2016-09-11 NOTE — ED Provider Notes (Signed)
[facility] Emergency Department Provider Note  ____________________________________________   First MD Initiated Contact with Patient 09/11/16 1734     (approximate)  I have reviewed the triage vital signs and the nursing notes.   HISTORY  Chief Complaint Suicidal  Level 5 caveat:  history/ROS limited by active psychosis / mental illness / altered mental status   HPI Michael Schmitt is a 46 y.o. male who is well-known to this emergency department with 20 visits in the last 6 months for a variety of psychiatric complaints.  Typically he presents with suicidal ideation and specific plans to kill himself and requesting benzodiazepines.  Today's presentation is similar although he was brought in by the [facility] after he reportedly hung a rope outside his house and claimed he was going to hang himself.  He states that he wants to die and is asking for benzodiazepines to help him calm down.  His symptoms are severe.  It is unclear for how long they have been going on.  He states that he has been compliant with his medications since he was last in the emergency department.  He denies fever/chills, chest pain, shortness of breath, nausea, vomiting, abdominal pain.    Nothing in particular makes the patient's symptoms better nor worse.    I evaluated the patient at roughly the same time as [name].  The patient had become violent in the emergency department and was hitting himself as well as throwing his bed around the ED exam room.  Both [name] and I responded and I authorized the use of Geodon 20 mg intramuscular as a calming agent.     Past Medical History:  Diagnosis Date  . Anxiety   . Anxiety   . Arthritis    cerv. stenosis, spondylosis, HNP- lower back , has been followed in pain clinic, has  had injection s in cerv. area  . Blood dyscrasia    told that when he was young he was a" free bleeder"  . CAD (coronary artery disease)     . Cervical spondylosis without myelopathy 07/24/2014  . Cervicogenic headache 07/24/2014  . Chronic kidney disease    renal calculi- passed spontaneously  . Depression   . Diabetes mellitus without complication ([place])   . Fatty liver   . GERD (gastroesophageal reflux disease)   . Headache(784.0)   . Hyperlipidemia   . Hypertension   . Mental disorder   . MI, old   . RLS (restless legs syndrome)    detected on sleep study  . Shortness of breath   . Ventricular fibrillation ([place]) 04/05/2016    Patient Active Problem List   Diagnosis Date Noted  . Major depressive disorder, recurrent, severe with psychotic features ([place]) 08/02/2016  . HTN (hypertension) 06/26/2016  . GERD (gastroesophageal reflux disease) 06/26/2016  . Alcohol use disorder, severe, 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[place]) 06/26/2016  . Cellulitis 06/26/2016  . Phencyclidine (PCP) use disorder, severe, dependence ([employer]) 05/28/2016  . Sedative, hypnotic or anxiolytic use disorder, severe, dependence ([place]) 05/12/2016  . Ventricular fibrillation ([place]) 04/05/2016  . Combined hyperlipidemia 01/05/2016  . History of ST elevation myocardial infarction (STEMI) 01/05/2016  . Major depressive disorder, recurrent episode, moderate with anxious distress ([place]) 06/21/2015  . Cervical spondylosis without myelopathy 07/24/2014  . Tobacco use disorder 10/20/2010  . Asthma 10/20/2010    Past Surgical History:  Procedure Laterality Date  . ANTERIOR CERVICAL DECOMP/DISCECTOMY FUSION  11/13/2011   Procedure: ANTERIOR CERVICAL DECOMPRESSION/DISCECTOMY FUSION  1 LEVEL;  Surgeon: [name], MD;  Location: [facility];  Service: Neurosurgery;  Laterality: N/A;  Anterior Cervical Decompression/discectomy Fusion. Cervical three-four.  Marland Kitchen CARDIAC CATHETERIZATION N/A 01/01/2015   Procedure: Left Heart Cath and Coronary Angiography;  Surgeon: [name], MD;  Location: [facility];  Service: Cardiovascular;  Laterality: N/A;  . CARDIAC  CATHETERIZATION N/A 04/05/2016   Procedure: Left Heart Cath and Coronary Angiography;  Surgeon: Belva Crome, MD;  Location: [facility];  Service: Cardiovascular;  Laterality: N/A;  . CARDIAC CATHETERIZATION N/A 04/05/2016   Procedure: Coronary Stent Intervention;  Surgeon: Belva Crome, MD;  Location: [facility];  Service: Cardiovascular;  Laterality: N/A;  . CARDIAC CATHETERIZATION N/A 04/05/2016   Procedure: Intravascular Ultrasound/IVUS;  Surgeon: Belva Crome, MD;  Location: [facility];  Service: Cardiovascular;  Laterality: N/A;  . NASAL SINUS SURGERY     2005    Prior to Admission medications   Medication Sig Start Date End Date Taking? Authorizing Provider  asenapine (SAPHRIS) 5 MG SUBL 24 hr tablet Place 2 tablets (10 mg total) under the tongue 2 (two) times daily. 08/03/16   Ethelene Hal, NP  aspirin 81 MG EC tablet Take 1 tablet (81 mg total) by mouth daily. 06/02/16   Pucilowska, Jolanta B, MD  atorvastatin (LIPITOR) 80 MG tablet Take 80 mg by mouth every evening.    [provider]  carvedilol (COREG) 6.25 MG tablet Take 6.25 mg by mouth 2 (two) times daily. 05/07/16   [provider]  citalopram (CELEXA) 20 MG tablet Take 1 tablet (20 mg total) by mouth daily. 08/04/16   Ethelene Hal, NP  clopidogrel (PLAVIX) 75 MG tablet Take 75 mg by mouth daily.    [provider]  gabapentin (NEURONTIN) 800 MG tablet Take 800 mg by mouth 4 (four) times daily.    [provider]  hydrOXYzine (ATARAX/VISTARIL) 25 MG tablet Take 1 tablet (25 mg total) by mouth every 6 (six) hours as needed for anxiety. 08/03/16   Ethelene Hal, NP  lisinopril (PRINIVIL,ZESTRIL) 5 MG tablet Take 5 mg by mouth daily.    [provider]  lithium carbonate 300 MG capsule Take 300 mg by mouth daily.    [provider]  mirtazapine (REMERON) 45 MG tablet Take 1 tablet (45 mg total) by mouth at bedtime. 08/03/16   Ethelene Hal, NP  nicotine (NICODERM CQ - DOSED IN MG/24 HOURS) 21 mg/24hr patch Place 1 patch (21 mg total) onto the skin daily at 6 (six) AM. 08/04/16   Ethelene Hal, NP  nitroGLYCERIN (NITROSTAT) 0.4 MG SL tablet Place 0.4 mg under the tongue every 5 (five) minutes x 3 doses as needed. 05/07/16 05/07/17  [provider]  ticagrelor (BRILINTA) 90 MG TABS tablet Take 1 tablet (90 mg total) by mouth 2 (two) times daily. 06/02/16   Pucilowska, Jolanta B, MD  tiZANidine (ZANAFLEX) 2 MG tablet Take 2 mg by mouth every 8 (eight) hours as needed for muscle spasms.    [provider]  traZODone (DESYREL) 100 MG tablet Take 1 tablet (100 mg total) by mouth at bedtime as needed for sleep. 08/03/16   Ethelene Hal, NP    Allergies Prednisone; Hydrocodone; and Varenicline  Family History  Problem Relation Age of Onset  . Prostate cancer Father   . Hypertension Mother   . Kidney Stones Mother   . Anxiety disorder Mother   .  Depression Mother   . COPD Sister   . Hypertension Sister   . Diabetes Sister   . Depression Sister   . Anxiety disorder Sister   . Seizures Sister   . ADD / ADHD Son   . ADD / ADHD Daughter     Social History Social History  Substance Use Topics  . Smoking status: Current Every Day Smoker    Packs/day: 2.00    Years: 17.00    Types: Cigarettes, Cigars  . Smokeless tobacco: Former Systems developer     Comment: occassional snuff   . Alcohol use No     Comment: last drinked in 3 months.     Review of Systems Level 5 caveat:  history/ROS limited by active psychosis / mental illness / altered mental status, but the patient does deny chest pain, fever/chills, shortness of breath, nausea, vomiting, abdominal pain.    ____________________________________________   PHYSICAL EXAM:  VITAL SIGNS: ED Triage Vitals [09/11/16 1502]  Enc Vitals Group     BP (!) 144/97     Pulse Rate (!) 127     Resp (!) 22     Temp 98.6 F (37 C)     Temp Source  Oral     SpO2 96 %     Weight 88.5 kg (195 lb)     Height 1.88 m (6\' 2" )     Head Circumference      Peak Flow      Pain Score      Pain Loc      Pain Edu?      Excl. in [place]?     Constitutional: Alert and oriented. Agitated and anxious, pacing the room and acting out aggressively Eyes: Conjunctivae are normal.  Head: Atraumatic. Nose: No congestion/rhinnorhea. Mouth/Throat: Mucous membranes are moist. Neck: No stridor.  No meningeal signs.   Cardiovascular: Normal rate, regular rhythm. Good peripheral circulation.  Respiratory: Normal respiratory effort.  No accessory muscle usage Musculoskeletal: No lower extremity tenderness nor edema. No gross deformities of extremities. Neurologic:  Normal speech and language. No gross focal neurologic deficits are appreciated.  Psychiatric: The patient's speech is pressured and loud.  He is endorsing suicidal ideation and acting violent in the emergency department.  ____________________________________________   LABS (all labs ordered are listed, but only abnormal results are displayed)  Labs Reviewed  COMPREHENSIVE METABOLIC PANEL - Abnormal; Notable for the following:       Result Value   Glucose, Bld 248 (*)    ALT 15 (*)    All other components within normal limits  ACETAMINOPHEN LEVEL - Abnormal; Notable for the following:    Acetaminophen (Tylenol), Serum <10 (*)    All other components within normal limits  URINE DRUG SCREEN, QUALITATIVE ([facility] ONLY) - Abnormal; Notable for the following:    Tricyclic, Ur Screen POSITIVE (*)    All other components within normal limits  ETHANOL  SALICYLATE LEVEL  CBC   ____________________________________________  EKG  None - EKG not ordered by ED physician ____________________________________________  RADIOLOGY   No results found.  ____________________________________________   PROCEDURES  Critical Care performed: No   Procedure(s) performed:    Procedures   ____________________________________________   INITIAL IMPRESSION / ASSESSMENT AND PLAN / ED COURSE  Pertinent labs & imaging results that were available during my care of the patient were reviewed by me and considered in my medical decision making (see chart for details).  The patient is being administered a calming agent and has no  evidence of any acute medical issues at this time.  [name] has already let me know that he plans to admit.      ____________________________________________  FINAL CLINICAL IMPRESSION(S) / ED DIAGNOSES  Final diagnoses:  Severe episode of recurrent major depressive disorder, with psychotic features ([place])     MEDICATIONS GIVEN DURING THIS VISIT:  Medications  asenapine (SAPHRIS) sublingual tablet 10 mg (not administered)  aspirin EC tablet 81 mg (not administered)  atorvastatin (LIPITOR) tablet 80 mg (not administered)  carvedilol (COREG) tablet 6.25 mg (not administered)  citalopram (CELEXA) tablet 20 mg (not administered)  clopidogrel (PLAVIX) tablet 75 mg (not administered)  gabapentin (NEURONTIN) capsule 800 mg (not administered)  lisinopril (PRINIVIL,ZESTRIL) tablet 5 mg (not administered)  lithium carbonate capsule 300 mg (not administered)  mirtazapine (REMERON) tablet 45 mg (not administered)  ticagrelor (BRILINTA) tablet 90 mg (not administered)  traZODone (DESYREL) tablet 100 mg (not administered)  LORazepam (ATIVAN) tablet 2 mg (not administered)  ziprasidone (GEODON) 20 MG injection (  Given 09/11/16 1745)  midazolam (VERSED) 5 MG/5ML injection (  Given 09/11/16 1745)  LORazepam (ATIVAN) 2 MG tablet (2 mg  Given 09/11/16 1916)     NEW OUTPATIENT MEDICATIONS STARTED DURING THIS VISIT:  New Prescriptions   No medications on file    Modified Medications   No medications on file    Discontinued Medications   No medications on file     Note:  This document was prepared using Dragon voice recognition  software and may include unintentional dictation errors.    Hinda Kehr, MD 09/11/16 1949

## 2016-09-11 NOTE — Progress Notes (Signed)
Admission Note: Patient is an 46 years old male admitted to the unit with symptoms of depression, anxiety and auditory hallucination.  Patient presents with flat affect and depressed mood.  Patient reports passive SI and contracts for safety.  Reports one of his stressor is finding placement in a group home and getting away from his family.  Skin assessment completed.  Skin is dry and intact.  Personal belonging searched.  No contraband found.  Plan of care reviewed with patient.  Consent for treatment signed.  Reports neck pain but refused pain medication when offered.  Patient oriented to the unit, staff and room.  Routine safety checks initiated.  Patient is safe on the unit.  Patient offered support and encouragement as needed.

## 2016-09-11 NOTE — ED Notes (Signed)
Pt becoming violent, hitting self, hitting bed. Pt yelling out. MD Karma Greaser made aware

## 2016-09-12 ENCOUNTER — Encounter: Payer: Self-pay | Admitting: Psychiatry

## 2016-09-12 DIAGNOSIS — F322 Major depressive disorder, single episode, severe without psychotic features: Secondary | ICD-10-CM

## 2016-09-12 DIAGNOSIS — I2510 Atherosclerotic heart disease of native coronary artery without angina pectoris: Secondary | ICD-10-CM

## 2016-09-12 LAB — LIPID PANEL
CHOL/HDL RATIO: 3.8 ratio
CHOLESTEROL: 126 mg/dL (ref 0–200)
HDL: 33 mg/dL — ABNORMAL LOW (ref >40)
LDL CALC: 37 mg/dL (ref 0–99)
Triglycerides: 281 mg/dL — ABNORMAL HIGH (ref <150)
VLDL: 56 mg/dL — AB (ref 0–40)

## 2016-09-12 LAB — GLUCOSE, CAPILLARY: GLUCOSE-CAPILLARY: 192 mg/dL — AB (ref 65–99)

## 2016-09-12 LAB — TSH: TSH: 1.343 u[IU]/mL (ref 0.350–4.500)

## 2016-09-12 MED ORDER — CYCLOBENZAPRINE HCL 5 MG PO TABS
7.5000 mg | ORAL_TABLET | Freq: Three times a day (TID) | ORAL | Status: DC | PRN
  Administered 2016-09-12 – 2016-09-14 (×2): 7.5 mg via ORAL
  Filled 2016-09-12 (×3): qty 1.5

## 2016-09-12 MED ORDER — METFORMIN HCL 500 MG PO TABS
500.0000 mg | ORAL_TABLET | Freq: Two times a day (BID) | ORAL | Status: DC
  Administered 2016-09-12 – 2016-09-15 (×6): 500 mg via ORAL
  Filled 2016-09-12 (×6): qty 1

## 2016-09-12 MED ORDER — ACETAMINOPHEN 500 MG PO TABS: 1000.0000 mg | ORAL_TABLET | Freq: Four times a day (QID) | ORAL | Status: DC | PRN

## 2016-09-12 MED ORDER — MELOXICAM 7.5 MG PO TABS: 7.5000 mg | ORAL_TABLET | Freq: Two times a day (BID) | ORAL | Status: DC | PRN

## 2016-09-12 MED ORDER — HYDROXYZINE HCL 25 MG PO TABS
25.0000 mg | ORAL_TABLET | Freq: Four times a day (QID) | ORAL | Status: DC | PRN
  Administered 2016-09-12 – 2016-09-15 (×6): 25 mg via ORAL
  Filled 2016-09-12 (×6): qty 1

## 2016-09-12 NOTE — Progress Notes (Signed)
Patient ID: Michael Schmitt, male   DOB: 21-Oct-1970, 46 y.o.   MRN: [id number] Medication regimen discussed, "where is my Ambien and Trazodone, why were they discontinued????.." Efforts to explain that the Remeron 45 mg Tablet is targeted insomnia medication was unsuccessful; Flexeril 7.5 mg given for muscle spasm.

## 2016-09-12 NOTE — [facility] Group Notes (Signed)
  09/12/2016 4:06 PM [place] LCSW Group Therapy Note  Date/Time  Type of Therapy/Topic:  Group Therapy:  Feelings about Diagnosis  Participation Level:  Minimal   Mood: Okay  Description of Group:    This group will allow patients to explore their thoughts and feelings about diagnoses they have received. Patients will be guided to explore their level of understanding and acceptance of these diagnoses. Facilitator will encourage patients to process their thoughts and feelings about the reactions of others to their diagnosis, and will guide patients in identifying ways to discuss their diagnosis with significant others in their lives. This group will be process-oriented, with patients participating in exploration of their own experiences as well as giving and receiving support and challenge from other group members.   Therapeutic Goals: 1. Patient will demonstrate understanding of diagnosis as evidence by identifying two or more symptoms of the disorder:  2. Patient will be able to express two feelings regarding the diagnosis 3. Patient will demonstrate ability to communicate their needs through discussion and/or role plays  Summary of Patient Progress: Pt participated minimally.  Appeared to be listening actively to other group members but did not contribute.  Remained present for the entire group.  Therapeutic Modalities:   Cognitive Behavioral Therapy Brief Therapy Feelings Identification   August Saucer, LCSW 09/12/2016, 4:06 PM

## 2016-09-12 NOTE — [facility] Group Notes (Signed)
[facility] LCSW Group Therapy  09/12/2016 4:06 PM  Goals Group Date/Time: 09/12/2016 9:00 AM Type of Therapy and Topic: Group Therapy: Goals Group: SMART Goals   Participation Level: DID NOT ATTEND  August Saucer, LCSW 09/12/2016, 4:06 PM
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
## 2016-09-12 NOTE — [facility] Suicide Risk Assessment (Signed)
[facility] Admission Suicide Risk Assessment   Nursing information obtained from:    Demographic factors:    Current Mental Status:    Loss Factors:    Historical Factors:    Risk Reduction Factors:     Total Time spent with patient: 30 minutes Principal Problem: MDD (major depressive disorder), severe ([place]) Diagnosis:   Patient Active Problem List   Diagnosis Date Noted  . Coronary artery disease [I25.10] 09/12/2016  . MDD (major depressive disorder), severe ([place]) [F32.2] 09/12/2016  . HTN (hypertension) [I10] 06/26/2016  . GERD (gastroesophageal reflux disease) [K21.9] 06/26/2016  . Alcohol use disorder, severe, 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[place]) [F10.21] 06/26/2016  . Phencyclidine (PCP) use disorder, severe, dependence ([place]) [F16.20] 05/28/2016  . Sedative, hypnotic or anxiolytic use disorder, severe, dependence ([place]) [F13.20] 05/12/2016  . Ventricular fibrillation ([place]) [I49.01] 04/05/2016  . 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 Cervical spondylosis without myelopathy [M47.812] 07/24/2014  . Tobacco use disorder [F17.200] 10/20/2010  . Asthma [J45.909] 10/20/2010   Subjective Data:   Continued Clinical Symptoms:  Alcohol Use Disorder Identification Test Final Score (AUDIT): 0 The "Alcohol Use Disorders Identification Test", Guidelines for Use in Primary Care, Second Edition.  World Pharmacologist [facility]). Score between 0-7:  no or low risk or alcohol related problems. Score between 8-15:  moderate risk of alcohol related problems. Score between 16-19:  high risk of alcohol related problems. Score 20 or above:  warrants further diagnostic evaluation for alcohol dependence and treatment.   CLINICAL FACTORS:   Severe Anxiety and/or Agitation Depression:   Hopelessness Impulsivity Chronic Pain Previous Psychiatric Diagnoses and Treatments   Psychiatric Specialty Exam: Physical Exam  ROS  Blood pressure (!) 127/91, pulse 86, temperature 98 F (36.7 C), temperature source  Oral, resp. rate 18, height 6\' 2"  (1.88 m), weight 88.5 kg (195 lb), SpO2 97 %.Body mass index is 25.04 kg/m.                                                    Sleep:  Number of Hours: 7.15      COGNITIVE FEATURES THAT CONTRIBUTE TO RISK:  Closed-mindedness    SUICIDE RISK:   Moderate:  Frequent suicidal ideation with limited intensity, and duration, some specificity in terms of plans, no associated intent, good self-control, limited dysphoria/symptomatology, some risk factors present, and identifiable protective factors, including available and accessible social support.  PLAN OF CARE: admit to [facility]  I certify that inpatient services furnished can reasonably be expected to improve the patient's condition.   Hildred Priest, MD 09/12/2016, 2:10 PM
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
## 2016-09-12 NOTE — Progress Notes (Signed)
   09/12/16 1400  Clinical Encounter Type  Visited With Patient;Other (Comment)  Visit Type Follow-up;Spiritual support;Social support (Group )   Chaplain was holding emotional support group on unit. Patient attended the small group. Patient participated and interacted within the group. Patient shared and laughed while completing activities. Chaplain will follow up with an individual visit.
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
## 2016-09-12 NOTE — Patient Care Conference (Signed)
Patient was anxious this morning and came to staff states "I feel like hurting me and other."Patient stated that he was doing good with clonopin and verbalized his frustration not getting clonopin.Pharmacologist for safety.At this patient verbalized that he feels better with his anxiety and only passive suicidal thoughts.Compliant with medications.Attended groups.Appetite and energy level good.Support & encouragement given.

## 2016-09-12 NOTE — BHH Group Notes (Signed)
[facility] Group Notes:  (Nursing/MHT/Case Management/Adjunct)  Date:  09/12/2016  Time:  1:45 PM  Type of Therapy:  Psychoeducational Skills  Participation Level:  Active  Participation Quality:  Appropriate, Attentive and Sharing  Affect:  Appropriate  Cognitive:  Alert and Appropriate  Insight:  Appropriate  Engagement in Group:  Engaged  Modes of Intervention:  Discussion, Education and Support  Summary of Progress/Problems:  Michael Schmitt 09/12/2016, 1:45 PM

## 2016-09-13 LAB — GLUCOSE, CAPILLARY
GLUCOSE-CAPILLARY: 183 mg/dL — AB (ref 65–99)
Glucose-Capillary: 115 mg/dL — ABNORMAL HIGH (ref 65–99)

## 2016-09-13 LAB — HEMOGLOBIN A1C
HEMOGLOBIN A1C: 7.8 % — AB (ref 4.8–5.6)
MEAN PLASMA GLUCOSE: 177 mg/dL

## 2016-09-13 LAB — PROLACTIN: PROLACTIN: 18.1 ng/mL — AB (ref 4.0–15.2)

## 2016-09-13 MED ORDER — TRAZODONE HCL 50 MG PO TABS
150.0000 mg | ORAL_TABLET | Freq: Once | ORAL | Status: AC
  Administered 2016-09-13: 150 mg via ORAL
  Filled 2016-09-13: qty 1

## 2016-09-13 NOTE — Tx Team (Signed)
Interdisciplinary Treatment and Diagnostic Plan Update  09/13/2016 Time of Session: 11:15 AM Michael Schmitt MRN: [id number]  Principal Diagnosis: MDD (major depressive disorder), severe ([place])  Secondary Diagnoses: Principal Problem:   MDD (major depressive disorder), severe ([place]) Active Problems:   Cervical spondylosis without myelopathy   Tobacco use disorder   Ventricular fibrillation (HCC)   Sedative, hypnotic or anxiolytic use disorder, severe, dependence (HCC)   HTN (hypertension)   GERD (gastroesophageal reflux disease)   Alcohol use disorder, severe, 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[place])   Asthma   Combined hyperlipidemia   Coronary artery disease   Current Medications:  Current Facility-Administered Medications  Medication Dose Route Frequency Provider Last Rate Last Dose  . acetaminophen (TYLENOL) tablet 1,000 mg  1,000 mg Oral Q6H PRN Hildred Priest, MD      . alum & mag hydroxide-simeth (MAALOX/MYLANTA) 200-200-20 MG/5ML suspension 30 mL  30 mL Oral Q4H PRN Clapacs, John T, MD      . asenapine (SAPHRIS) sublingual tablet 10 mg  10 mg Sublingual BID Clapacs, Madie Reno, MD   10 mg at 09/13/16 0840  . aspirin EC tablet 81 mg  81 mg Oral Daily Clapacs, Madie Reno, MD   81 mg at 09/13/16 0840  . atorvastatin (LIPITOR) tablet 80 mg  80 mg Oral q1800 Clapacs, Madie Reno, MD   80 mg at 09/12/16 1809  . carvedilol (COREG) tablet 6.25 mg  6.25 mg Oral BID WC Clapacs, Madie Reno, MD   6.25 mg at 09/13/16 0839  . clopidogrel (PLAVIX) tablet 75 mg  75 mg Oral Daily Clapacs, Madie Reno, MD   75 mg at 09/13/16 0839  . cyclobenzaprine (FLEXERIL) tablet 7.5 mg  7.5 mg Oral TID PRN Hildred Priest, MD   7.5 mg at 09/12/16 2322  . gabapentin (NEURONTIN) capsule 800 mg  800 mg Oral QID Clapacs, John T, MD   800 mg at 09/13/16 0839  . hydrOXYzine (ATARAX/VISTARIL) tablet 25 mg  25 mg Oral Q6H PRN Hildred Priest, MD   25 mg at 09/13/16 0840  . lisinopril (PRINIVIL,ZESTRIL) tablet 5 mg   5 mg Oral Daily Clapacs, Madie Reno, MD   5 mg at 09/13/16 0910  . magnesium hydroxide (MILK OF MAGNESIA) suspension 30 mL  30 mL Oral Daily PRN Clapacs, John T, MD      . meloxicam (MOBIC) tablet 7.5 mg  7.5 mg Oral BID PRN Hildred Priest, MD      . metFORMIN (GLUCOPHAGE) tablet 500 mg  500 mg Oral BID WC Hildred Priest, MD   500 mg at 09/13/16 0839  . mirtazapine (REMERON) tablet 45 mg  45 mg Oral QHS Clapacs, John T, MD   45 mg at 09/12/16 1956  . nicotine (NICODERM CQ - dosed in mg/24 hours) patch 21 mg  21 mg Transdermal Daily Pucilowska, Jolanta B, MD   21 mg at 09/13/16 0800  . ticagrelor (BRILINTA) tablet 90 mg  90 mg Oral BID Clapacs, Madie Reno, MD   90 mg at 09/13/16 6270   PTA Medications: Prescriptions Prior to Admission  Medication Sig Dispense Refill Last Dose  . asenapine (SAPHRIS) 5 MG SUBL 24 hr tablet Place 2 tablets (10 mg total) under the tongue 2 (two) times daily. 28 tablet 0 Past Week at Unknown time  . aspirin 81 MG EC tablet Take 1 tablet (81 mg total) by mouth daily. 30 tablet 1 Past Week at Unknown time  . atorvastatin (LIPITOR) 80 MG tablet Take 80 mg by mouth every  evening.   Past Week at Unknown time  . busPIRone (BUSPAR) 10 MG tablet Take 20 mg by mouth 3 (three) times daily.   Past Week at Unknown time  . carvedilol (COREG) 6.25 MG tablet Take 6.25 mg by mouth 2 (two) times daily.   Past Week at Unknown time  . chlorproMAZINE (THORAZINE) 200 MG tablet Take 200 mg by mouth at bedtime.     . chlorproMAZINE (THORAZINE) 25 MG tablet Take 25 mg by mouth 3 (three) times daily.   Past Week at Unknown time  . clopidogrel (PLAVIX) 75 MG tablet Take 75 mg by mouth daily.   Past Week at Unknown time  . divalproex (DEPAKOTE ER) 500 MG 24 hr tablet Take 500-1,500 mg by mouth daily. Take 500 mg in the morning and 1,500 mg at night   Past Week at Unknown time  . DULoxetine (CYMBALTA) 20 MG capsule Take 20 mg by mouth 2 (two) times daily.   Past Week at Unknown  time  . FLUoxetine (PROZAC) 20 MG capsule Take 40 mg by mouth daily.   Past Week at Unknown time  . gabapentin (NEURONTIN) 800 MG tablet Take 800 mg by mouth 3 (three) times daily.    Past Week at Unknown time  . hydrOXYzine (ATARAX/VISTARIL) 25 MG tablet Take 1 tablet (25 mg total) by mouth every 6 (six) hours as needed for anxiety. (Patient taking differently: Take 50 mg by mouth every 6 (six) hours as needed for anxiety. ) 30 tablet 0 unknown at unknown  . lisinopril (PRINIVIL,ZESTRIL) 5 MG tablet Take 10 mg by mouth daily.    Past Week at Unknown time  . metFORMIN (GLUCOPHAGE) 500 MG tablet Take 500 mg by mouth 2 (two) times daily.  0 Past Week at UNKNOWN  . mirtazapine (REMERON) 45 MG tablet Take 1 tablet (45 mg total) by mouth at bedtime. 14 tablet 0 Past Week at Unknown time  . nitroGLYCERIN (NITROSTAT) 0.4 MG SL tablet Place 0.4 mg under the tongue every 5 (five) minutes x 3 doses as needed.   PRN at PRN  . citalopram (CELEXA) 20 MG tablet Take 1 tablet (20 mg total) by mouth daily. (Patient not taking: Reported on 09/12/2016) 14 tablet 0 Completed Course at Unknown time  . nicotine (NICODERM CQ - DOSED IN MG/24 HOURS) 21 mg/24hr patch Place 1 patch (21 mg total) onto the skin daily at 6 (six) AM. (Patient not taking: Reported on 09/12/2016) 28 patch 0 Not Taking at Unknown time  . ticagrelor (BRILINTA) 90 MG TABS tablet Take 1 tablet (90 mg total) by mouth 2 (two) times daily. (Patient not taking: Reported on 09/12/2016) 60 tablet 1 Not Taking at Unknown time  . traZODone (DESYREL) 100 MG tablet Take 1 tablet (100 mg total) by mouth at bedtime as needed for sleep. (Patient not taking: Reported on 09/12/2016) 14 tablet 0 Not Taking at PRN    Patient Stressors: Financial difficulties Health problems Marital or family conflict  Patient Strengths: Ability for insight Average or above average intelligence Capable of independent living Communication skills Supportive family/friends  Treatment  Modalities: Medication Management, Group therapy, Case management,  1 to 1 session with clinician, Psychoeducation, Recreational therapy.   Physician Treatment Plan for Primary Diagnosis: MDD (major depressive disorder), severe ([place]) Long Term Goal(s): Improvement in symptoms so as ready for discharge Improvement in symptoms so as ready for discharge   Short Term Goals: Ability to verbalize feelings will improve Ability to disclose and discuss suicidal ideas  Ability to identify triggers associated with substance abuse/mental health issues will improve Ability to identify and develop effective coping behaviors will improve  Medication Management: Evaluate patient's response, side effects, and tolerance of medication regimen.  Therapeutic Interventions: 1 to 1 sessions, Unit Group sessions and Medication administration.  Evaluation of Outcomes: Progressing  Physician Treatment Plan for Secondary Diagnosis: Principal Problem:   MDD (major depressive disorder), severe ([place]) Active Problems:   Cervical spondylosis without myelopathy   Tobacco use disorder   Ventricular fibrillation (HCC)   Sedative, hypnotic or anxiolytic use disorder, severe, dependence (HCC)   HTN (hypertension)   GERD (gastroesophageal reflux disease)   Alcohol use disorder, severe, 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[place])   Asthma   Combined hyperlipidemia   Coronary artery disease  Long Term Goal(s): Improvement in symptoms so as ready for discharge Improvement in symptoms so as ready for discharge   Short Term Goals: Ability to verbalize feelings will improve Ability to disclose and discuss suicidal ideas Ability to identify triggers associated with substance abuse/mental health issues will improve Ability to identify and develop effective coping behaviors will improve     Medication Management: Evaluate patient's response, side effects, and tolerance of medication regimen.  Therapeutic Interventions: 1 to 1 sessions,  Unit Group sessions and Medication administration.  Evaluation of Outcomes: Progressing   RN Treatment Plan for Primary Diagnosis: MDD (major depressive disorder), severe ([place]) Long Term Goal(s): Knowledge of disease and therapeutic regimen to maintain health will improve  Short Term Goals: Ability to demonstrate self-control, Ability to disclose and discuss suicidal ideas and Compliance with prescribed medications will improve  Medication Management: RN will administer medications as ordered by provider, will assess and evaluate patient's response and provide education to patient for prescribed medication. RN will report any adverse and/or side effects to prescribing provider.  Therapeutic Interventions: 1 on 1 counseling sessions, Psychoeducation, Medication administration, Evaluate responses to treatment, Monitor vital signs and CBGs as ordered, Perform/monitor CIWA, COWS, AIMS and Fall Risk screenings as ordered, Perform wound care treatments as ordered.  Evaluation of Outcomes: Progressing   LCSW Treatment Plan for Primary Diagnosis: MDD (major depressive disorder), severe ([place]) Long Term Goal(s): Safe transition to appropriate next level of care at discharge, Engage patient in therapeutic group addressing interpersonal concerns.  Short Term Goals: Engage patient in aftercare planning with referrals and resources, Increase social support, Increase emotional regulation and Increase skills for wellness and recovery  Therapeutic Interventions: Assess for all discharge needs, 1 to 1 time with Social worker, Explore available resources and support systems, Assess for adequacy in community support network, Educate family and significant other(s) on suicide prevention, Complete Psychosocial Assessment, Interpersonal group therapy.  Evaluation of Outcomes: Progressing   Progress in Treatment: Attending groups: Yes. Participating in groups: Yes. Taking medication as prescribed:  Yes. Toleration medication: Yes. Family/Significant other contact made: No, will contact:  CSW assessing appropriate contact. Patient understands diagnosis: Yes. Discussing patient identified problems/goals with staff: Yes. Medical problems stabilized or resolved: Yes. Denies suicidal/homicidal ideation: Yes. Issues/concerns per patient self-inventory: No.  New problem(s) identified: No, Describe:  None.  New Short Term/Long Term Goal(s): Patient stated that his goal is to "feel better."  Discharge Plan or Barriers: CSW assessing appropriate discharge plan.  Reason for Continuation of Hospitalization: Anxiety Depression Suicidal ideation  Estimated Length of Stay: 3-5 days   Attendees: Patient: Michael Schmitt 09/13/2016 11:30 AM  Physician: Dr. Merlyn Albert, MD  09/13/2016 11:30 AM  Nursing:  09/13/2016 11:30 AM  RN Care  Manager: 09/13/2016 11:30 AM  Social Worker: Glorious Peach, MSW, LCSW-A 09/13/2016 11:30 AM  Recreational Therapist:  09/13/2016 11:30 AM  Other: Pasty Spillers, PA Student  09/13/2016 11:30 AM  Other:  09/13/2016 11:30 AM  Other: 09/13/2016 11:30 AM    Scribe for Treatment Team: Emilie Rutter, [place] 09/13/2016 11:30 AM

## 2016-09-13 NOTE — Progress Notes (Signed)
Patient irritable this am. Reports has demons inside of him. Pt reports he wants to hurt himself. Pt also reports he does better when hes on klonopin. Pt says he wants to die, but has no plan to kill himself. Reports "I just don't wanna be here anymore" Denies HI. Pt did attend groups today. Encouraged patient to use coping skill to deal with anxiety. Pt verbalized understanding.  Encouragement and support offered. Safety checks maintained. Medications prescribed. Pt receptive and remains safe on unit with q 15 min checks.

## 2016-09-13 NOTE — BHH Suicide Risk Assessment (Signed)
[place] INPATIENT:  Family/Significant Other Suicide Prevention Education  Suicide Prevention Education:  Education Completed; sister, Irfan Veal ph#: [phone] has been identified by the patient as the family member/significant other with whom the patient will be residing, and identified as the person(s) who will aid the patient in the event of a mental health crisis (suicidal ideations/suicide attempt).  With written consent from the patient, the family member/significant other has been provided the following suicide prevention education, prior to the and/or following the discharge of the patient.  The suicide prevention education provided includes the following:  Suicide risk factors  Suicide prevention and interventions  National Suicide Hotline telephone number  [facility] assessment telephone number  [facility] Emergency Assistance [place] and/or Residential Mobile Crisis Unit telephone number  Request made of family/significant other to:  Remove weapons (e.g., guns, rifles, knives), all items previously/currently identified as safety concern.    Remove drugs/medications (over-the-counter, prescriptions, illicit drugs), all items previously/currently identified as a safety concern.  The family member/significant other verbalizes understanding of the suicide prevention education information provided.  The family member/significant other agrees to remove the items of safety concern listed above.  Emilie Rutter, MSW, LCSW-A 09/13/2016, 2:12 PM

## 2016-09-13 NOTE — Progress Notes (Signed)
Patient ID: Michael Schmitt, male   DOB: Jan 08, 1971, 46 y.o.   MRN: [id number] "[name] said I am going home on Friday, I don't want to go home, I will kill myself if I go home. I want to go to [facility] in [place] .Marland Kitchen..do you think she will send me to [facility].." Patient is communicating threat of SIB (Head banging, smacking and punching self), patient constantly perseverating about going to [facility]; "If I don't hit myself, she will send me there? I think if I hit myself she will understand that I am not ready to go home, I want to go to a long care environment.Marland KitchenMarland Kitchen"

## 2016-09-13 NOTE — Plan of Care (Signed)
Problem: Activity: Goal: Sleeping patterns will improve Outcome: Progressing Patient slept for Estimated Hours of 6.30; q15 minutes safety round maintained, no injury or falls during this shift.

## 2016-09-13 NOTE — Progress Notes (Signed)
Pt. requested prayer from Ch. which he rendered.

## 2016-09-13 NOTE — Progress Notes (Signed)
[facility] MD Progress Note  09/13/2016 9:09 AM Michael Schmitt  MRN:  [id number] Subjective:   Patient is a 46 year old male with history of major depressive disorder, benzodiazepine and alcohol abuse. The patient presented under involuntary commitment to our emergency department on June 18. Apparently the patient hung a rope outside his home and was going to hang himself. Unclear as to who called mobile crisis.  While in the ER the patient was hitting himself, the bed and yelling.  Patient has had 20 visits in the emergency department over the last 6 months. He has a long history of med seeking behaviors. Patient usually states he is suicidal and requests benzodiazepines in order to help his symptoms.  In addition patient reports that he received an eviction notice from his landlord.  Patient states that for many years he was managed with high doses of clonazepam. His psychiatrist discontinued it in February 2018, since then the patient reports that he has been much worse and constantly having suicidal ideation. He also frequently thinks about hurting others but nobody in specific. Patient complains of feeling afraid of leaving his house, he cannot elaborate why. Patient reported depressed mood, insomnia, anergia, poor appetite, poor concentration, suicidality and anhedonia. He denies auditory or visual hallucinations. Patient denies any symptoms consistent with mania or hypomania.  6/20 patient says he is not doing well. Has multiple complaints. Says he can't sleep with trazodone. He once the Ambien back. He wants to see what medication changes are going to be made today because he doesn't feel any better. He reports not to be on this earth. He is very manipulative. Every time he hears an answer he dislikes he says that he rather be dead. Yesterday he told me his Medicaid had expired today he tells me his medications not spiral. He told me he was evicted yesterday but today tells me he has not  received any formal  documentation of eviction  Per nursing: Randomly roaming and pacing, "I just want my Ambien back so that I can sleep, I feel like hitting someone and if I don't I will hit myself..."  Medication regimen discussed, "where is my Ambien and Trazodone, why were they discontinued????.." Efforts to explain that the Remeron 45 mg Tablet is targeted insomnia medication was unsuccessful; Flexeril 7.5 mg given for muscle spasm.  Principal Problem: MDD (major depressive disorder), severe ([place]) Diagnosis:   Patient Active Problem List   Diagnosis Date Noted  . Coronary artery disease [I25.10] 09/12/2016  . MDD (major depressive disorder), severe ([place]) [F32.2] 09/12/2016  . HTN (hypertension) [I10] 06/26/2016  . GERD (gastroesophageal reflux disease) [K21.9] 06/26/2016  . Alcohol use disorder, severe, 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[place]) [F10.21] 06/26/2016  . Phencyclidine (PCP) use disorder, severe, dependence ([place]) [F16.20] 05/28/2016  . Sedative, hypnotic or anxiolytic use disorder, severe, dependence ([place]) [F13.20] 05/12/2016  . Ventricular fibrillation ([place]) [I49.01] 04/05/2016  . 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 Cervical spondylosis without myelopathy [M47.812] 07/24/2014  . Tobacco use disorder [F17.200] 10/20/2010  . Asthma [J45.909] 10/20/2010   Total Time spent with patient: 30 minutes  Past Psychiatric History: Patient reports that he has had at least 5 hospitalizations in his life, so basically most of them had happened this year as a result of the charges he received last year.  He has been a patient of Dr. Kasandra Knudsen at [facility] in [place] for more than 14 years. He was treated with high-dose clonazepam. High-dose clonazepam was discontinued several months ago as the patient was  also drinking.  Pertinent [place] controlled substance database appears that the patient was just discharged from [facility] and was prescribed Ambien   Past Medical History:  Past  Medical History:  Diagnosis Date  . Anxiety   . Anxiety   . Arthritis    cerv. stenosis, spondylosis, HNP- lower back , has been followed in pain clinic, has  had injection s in cerv. area  . Blood dyscrasia    told that when he was young he was a" free bleeder"  . CAD (coronary artery disease)   . Cervical spondylosis without myelopathy 07/24/2014  . Cervicogenic headache 07/24/2014  . Chronic kidney disease    renal calculi- passed spontaneously  . Depression   . Diabetes mellitus without complication ([place])   . Fatty liver   . GERD (gastroesophageal reflux disease)   . Headache(784.0)   . Hyperlipidemia   . Hypertension   . Mental disorder   . MI, old   . RLS (restless legs syndrome)    detected on sleep study  . Shortness of breath   . Ventricular fibrillation ([place]) 04/05/2016    Past Surgical History:  Procedure Laterality Date  . ANTERIOR CERVICAL DECOMP/DISCECTOMY FUSION  11/13/2011   Procedure: ANTERIOR CERVICAL DECOMPRESSION/DISCECTOMY FUSION 1 LEVEL;  Surgeon: [name], MD;  Location: [place];  Service: Neurosurgery;  Laterality: N/A;  Anterior Cervical Decompression/discectomy Fusion. Cervical three-four.  Marland Kitchen CARDIAC CATHETERIZATION N/A 01/01/2015   Procedure: Left Heart Cath and Coronary Angiography;  Surgeon: [name], MD;  Location: [place] [facility];  Service: Cardiovascular;  Laterality: N/A;  . CARDIAC CATHETERIZATION N/A 04/05/2016   Procedure: Left Heart Cath and Coronary Angiography;  Surgeon: Belva Crome, MD;  Location: [place] CV LAB;  Service: Cardiovascular;  Laterality: N/A;  . CARDIAC CATHETERIZATION N/A 04/05/2016   Procedure: Coronary Stent Intervention;  Surgeon: Belva Crome, MD;  Location: [facility] CV LAB;  Service: Cardiovascular;  Laterality: N/A;  . CARDIAC CATHETERIZATION N/A 04/05/2016   Procedure: Intravascular Ultrasound/IVUS;  Surgeon: Belva Crome, MD;  Location: [place] CV LAB;  Service: Cardiovascular;  Laterality:  N/A;  . NASAL SINUS SURGERY     2005   Family History:  Family History  Problem Relation Age of Onset  . Prostate cancer Father   . Hypertension Mother   . Kidney Stones Mother   . Anxiety disorder Mother   . Depression Mother   . COPD Sister   . Hypertension Sister   . Diabetes Sister   . Depression Sister   . Anxiety disorder Sister   . Seizures Sister   . ADD / ADHD Son   . ADD / ADHD Daughter    Family Psychiatric  History: Paternal grandmother suffered from depression. Patient's mother was diagnosed with depression. No drug use or alcoholism in the family  Social History: Patient has been disabled for years after having an injury in his neck. 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 He lives with his mother, sister and the patient's 2 children who are 36 and 33 years old. The patient's first wife died many years ago years ago. He had a long relationship after that but never got married. He had 2 children with this partner.   Highest level of education is 11th grade History  Alcohol Use No    Comment: last drinked in 3 months.      History  Drug Use No    Social History   Social History  .  Marital status: Widowed    Spouse name: N/A  . Number of children: 3  . Years of education: 12   Occupational History  .      unemployed   Social History Main Topics  . Smoking status: Current Every Day Smoker    Packs/day: 2.00    Years: 17.00    Types: Cigarettes, Cigars  . Smokeless tobacco: Former Systems developer     Comment: occassional snuff   . Alcohol use No     Comment: last drinked in 3 months.   . Drug use: No  . Sexual activity: Not Currently   Other Topics Concern  . None   Social History Narrative   Patient is right handed.   Patient drinks 2 sodas daily.   Additional Social History:    History of alcohol / drug use?: No history of alcohol / drug abuse (Patient denies alcohol use in 4 months)    Current Medications: Current Facility-Administered  Medications  Medication Dose Route Frequency Provider Last Rate Last Dose  . acetaminophen (TYLENOL) tablet 1,000 mg  1,000 mg Oral Q6H PRN Hildred Priest, MD      . alum & mag hydroxide-simeth (MAALOX/MYLANTA) 200-200-20 MG/5ML suspension 30 mL  30 mL Oral Q4H PRN Clapacs, John T, MD      . asenapine (SAPHRIS) sublingual tablet 10 mg  10 mg Sublingual BID Clapacs, Madie Reno, MD   10 mg at 09/13/16 0840  . aspirin EC tablet 81 mg  81 mg Oral Daily Clapacs, Madie Reno, MD   81 mg at 09/13/16 0840  . atorvastatin (LIPITOR) tablet 80 mg  80 mg Oral q1800 Clapacs, Madie Reno, MD   80 mg at 09/12/16 1809  . carvedilol (COREG) tablet 6.25 mg  6.25 mg Oral BID WC Clapacs, Madie Reno, MD   6.25 mg at 09/13/16 0839  . clopidogrel (PLAVIX) tablet 75 mg  75 mg Oral Daily Clapacs, Madie Reno, MD   75 mg at 09/13/16 0839  . cyclobenzaprine (FLEXERIL) tablet 7.5 mg  7.5 mg Oral TID PRN Hildred Priest, MD   7.5 mg at 09/12/16 2322  . gabapentin (NEURONTIN) capsule 800 mg  800 mg Oral QID Clapacs, John T, MD   800 mg at 09/13/16 0839  . hydrOXYzine (ATARAX/VISTARIL) tablet 25 mg  25 mg Oral Q6H PRN Hildred Priest, MD   25 mg at 09/13/16 0840  . lisinopril (PRINIVIL,ZESTRIL) tablet 5 mg  5 mg Oral Daily Clapacs, John T, MD   5 mg at 09/12/16 1016  . magnesium hydroxide (MILK OF MAGNESIA) suspension 30 mL  30 mL Oral Daily PRN Clapacs, John T, MD      . meloxicam (MOBIC) tablet 7.5 mg  7.5 mg Oral BID PRN Hildred Priest, MD      . metFORMIN (GLUCOPHAGE) tablet 500 mg  500 mg Oral BID WC Hildred Priest, MD   500 mg at 09/13/16 0839  . mirtazapine (REMERON) tablet 45 mg  45 mg Oral QHS Clapacs, John T, MD   45 mg at 09/12/16 1956  . nicotine (NICODERM CQ - dosed in mg/24 hours) patch 21 mg  21 mg Transdermal Daily Pucilowska, Jolanta B, MD   21 mg at 09/12/16 1019  . ticagrelor (BRILINTA) tablet 90 mg  90 mg Oral BID Clapacs, Madie Reno, MD   90 mg at 09/12/16 1818    Lab  Results:  Results for orders placed or performed during the hospital encounter of 09/11/16 (from the past 48 hour(s))  Hemoglobin  A1c     Status: Abnormal   Collection Time: 09/12/16  6:42 AM  Result Value Ref Range   Hgb A1c MFr Bld 7.8 (H) 4.8 - 5.6 %    Comment: (NOTE)         Pre-diabetes: 5.7 - 6.4         Diabetes: >6.4         Glycemic control for adults with diabetes: <7.0    Mean Plasma Glucose 177 mg/dL    Comment: (NOTE) Performed At: [facility] [place], [place] [id number] Lindon Romp MD BJ:[id number]   Lipid panel     Status: Abnormal   Collection Time: 09/12/16  6:42 AM  Result Value Ref Range   Cholesterol 126 0 - 200 mg/dL   Triglycerides 281 (H) <150 mg/dL   HDL 33 (L) >40 mg/dL   Total CHOL/HDL Ratio 3.8 RATIO   VLDL 56 (H) 0 - 40 mg/dL   LDL Cholesterol 37 0 - 99 mg/dL    Comment:        Total Cholesterol/HDL:CHD Risk Coronary Heart Disease Risk Table                     Men   Women  1/2 Average Risk   3.4   3.3  Average Risk       5.0   4.4  2 X Average Risk   9.6   7.1  3 X Average Risk  23.4   11.0        Use the calculated Patient Ratio above and the CHD Risk Table to determine the patient's CHD Risk.        ATP III CLASSIFICATION (LDL):  <100     mg/dL   Optimal  100-129  mg/dL   Near or Above                    Optimal  130-159  mg/dL   Borderline  160-189  mg/dL   High  >190     mg/dL   Very High   Prolactin     Status: Abnormal   Collection Time: 09/12/16  6:42 AM  Result Value Ref Range   Prolactin 18.1 (H) 4.0 - 15.2 ng/mL    Comment: (NOTE) Performed At: [facility] [place], [place] [id number] Lindon Romp MD IR:[id number]   TSH     Status: None   Collection Time: 09/12/16  6:42 AM  Result Value Ref Range   TSH 1.343 0.350 - 4.500 uIU/mL    Comment: Performed by a 3rd Generation assay with a functional sensitivity of <=0.01 uIU/mL.  Glucose, capillary     Status:  Abnormal   Collection Time: 09/12/16  8:42 PM  Result Value Ref Range   Glucose-Capillary 192 (H) 65 - 99 mg/dL  Glucose, capillary     Status: Abnormal   Collection Time: 09/13/16  6:08 AM  Result Value Ref Range   Glucose-Capillary 183 (H) 65 - 99 mg/dL   Comment 1 Notify RN    Comment 2 Document in Chart     Blood Alcohol level:  Lab Results  Component Value Date   ETH <5 09/11/2016   ETH <5 00/93/8182    Metabolic Disorder Labs: Lab Results  Component Value Date   HGBA1C 7.8 (H) 09/12/2016   MPG 177 09/12/2016   MPG 174 08/03/2016   Lab Results  Component Value Date   PROLACTIN 18.1 (H)  09/12/2016   PROLACTIN 6.5 05/27/2016   Lab Results  Component Value Date   CHOL 126 09/12/2016   TRIG 281 (H) 09/12/2016   HDL 33 (L) 09/12/2016   CHOLHDL 3.8 09/12/2016   VLDL 56 (H) 09/12/2016   LDLCALC 37 09/12/2016   LDLCALC 44 08/03/2016    Physical Findings: AIMS: Facial and Oral Movements Muscles of Facial Expression: None, normal Lips and Perioral Area: None, normal Jaw: None, normal Tongue: None, normal,Extremity Movements Upper (arms, wrists, hands, fingers): None, normal Lower (legs, knees, ankles, toes): None, normal, Trunk Movements Neck, shoulders, hips: None, normal, Overall Severity Severity of abnormal movements (highest score from questions above): None, normal Incapacitation due to abnormal movements: None, normal Patient's awareness of abnormal movements (rate only patient's report): No Awareness, Dental Status Current problems with teeth and/or dentures?: No Does patient usually wear dentures?: No  CIWA:    COWS:     Musculoskeletal: Strength & Muscle Tone: within normal limits Gait & Station: normal Patient leans: N/A  Psychiatric Specialty Exam: Physical Exam  Constitutional: He appears well-developed and well-nourished.  HENT:  Head: Normocephalic and atraumatic.  Respiratory: Effort normal.  Musculoskeletal: Normal range of motion.   Neurological: He is alert.    Review of Systems  Constitutional: Negative.   HENT: Negative.   Eyes: Negative.   Respiratory: Negative.   Cardiovascular: Negative.   Gastrointestinal: Negative.   Genitourinary: Negative.   Musculoskeletal: Negative.   Skin: Negative.   Neurological: Negative.   Endo/Heme/Allergies: Negative.   Psychiatric/Behavioral: Positive for depression and suicidal ideas.    Blood pressure (!) 109/57, pulse 78, temperature 98.2 F (36.8 C), temperature source Oral, resp. rate 18, height 6\' 2"  (1.88 m), weight 88.5 kg (195 lb), SpO2 97 %.Body mass index is 25.04 kg/m.  General Appearance: Well Groomed  Eye Contact:  Good  Speech:  Clear and Coherent  Volume:  Decreased  Mood:  Dysphoric  Affect:  Blunt  Thought Process:  Linear and Descriptions of Associations: Intact  Orientation:  Full (Time, Place, and Person)  Thought Content:  Hallucinations: None  Suicidal Thoughts:  No  Homicidal Thoughts:  No  Memory:  Immediate;   Fair Recent;   Fair Remote;   Fair  Judgement:  Poor  Insight:  Lacking  Psychomotor Activity:  Decreased  Concentration:  Concentration: Poor and Attention Span: Poor  Recall:  [employer] of Knowledge:  Fair  Language:  Good  Akathisia:  No  Handed:    AIMS (if indicated):     Assets:  Communication Skills  ADL's:  Intact  Cognition:  WNL  Sleep:  Number of Hours: 6.3     Treatment Plan Summary:  Patient is a 46 year old with depression, chronic anxiety and substance abuse issues. Patient has very limited coping abilities. He tells me he has received eviction notice to 2 prior legal charges of DWIs.  Currently homeless   Major depressive disorder and insomnia. The patient continues to state he has great difficulty sleeping. I will continue him on mirtazapine 45 mg at bedtime and trazodone. I will discontinue citalopram as the patient should not be on 3 antidepressants. Patient slept 6 hours last night  Anxiety:  Continue Saphris 10 mg twice a day. Patient has refractory anxiety that has failed multiple treatments. Patient has displayed significant issues with med seeking behaviors. He is frequently in the emergency department requesting Ativan or Klonopin  Alcohol use disorder patient has been in remission for several months.   Chronic pain:  Continue Flexeril 7.5 mg tid when necessary and add meloxican 7.5mg  bidwhen necessary. Continue Neurontin 800 mg 4 times a day  Tobacco use disorder: Continuenicotine patch 21 mg  HTN: continue coreg 6.5 mg bid and lisinopril 5 mg daily  Diabetes continue metformin 500 mg twice a day  Dyslipidemia: Continue Lipitor 80 mg a day   Cardiovascular disease continue aspirin 81 mg a day and brilinta  Patient has been somewhat manipulative telling me that he will kill himself and that he feels like hurting others. He continues to request benzodiazepines. In addition to his problems with depression and anxiety the patient tells me he was also evicted from his home because of his criminal record (2 previous DWIs). Patient told me yesterday that he is homeless but he has not received a written notification of addiction  He let his Medicaid expired. He is requesting a family care home, which will be appropriate, however he he has no funding for the payment of placement as he no longer has medicaid.  Labs have been reviewed. He has a normal TSH. Urine toxicology is only positive for TCAs. Alcohol level was below the detection limit.  Precautions every 15 minute checks  Hospitalization and status will change to voluntary  Diet low sodium and carb modified  Vital signs daily  Dispo: To be determined  Follow-up to be determined.  Hildred Priest, MD 09/13/2016, 9:09 AM

## 2016-09-13 NOTE — Plan of Care (Signed)
Problem: Coping: Goal: Ability to identify and develop effective coping behavior will improve Outcome: Not Progressing Pt unable to state coping skills to deal with anxiety

## 2016-09-13 NOTE — B[facility] Group Notes (Signed)
[facility] Group Notes:  (Nursing/MHT/Case Management/Adjunct)  Date:  09/13/2016  Time:  9:29 PM  Type of Therapy:  Group Therapy  Participation Level:  Active  Participation Quality:  Appropriate  Affect:  Appropriate  Cognitive:  Appropriate  Insight:  Appropriate  Engagement in Group:  Engaged  Modes of Intervention:  Discussion  Summary of Progress/Problems:  Kandis Fantasia 09/13/2016, 9:29 PM

## 2016-09-13 NOTE — Progress Notes (Signed)
Randomly roaming and pacing, "I just want my Ambien back so that I can sleep, I feel like hitting someone and if I don't I will hit myself..."

## 2016-09-13 NOTE — BHH Group Notes (Signed)
  [facility] LCSW Group Therapy Note  Date/Time:09/13/2016, 9:30am  Type of Therapy/Topic:  Group Therapy:  Emotion Regulation  Participation Level:  Minimal   Mood: Anxious, depressed per self report  Description of Group:    The purpose of this group is to assist patients in learning to regulate negative emotions and experience positive emotions. Patients will be guided to discuss ways in which they have been vulnerable to their negative emotions. These vulnerabilities will be juxtaposed with experiences of positive emotions or situations, and patients challenged to use positive emotions to combat negative ones. Special emphasis will be placed on coping with negative emotions in conflict situations, and patients will process healthy conflict resolution skills.  Therapeutic Goals: 1. Patient will identify two positive emotions or experiences to reflect on in order to balance out negative emotions:  2. Patient will label two or more emotions that they find the most difficult to experience:  3. Patient will be able to demonstrate positive conflict resolution skills through discussion or role plays:   Summary of Patient Progress:  Pt participated minimally in group discussion, but appeared to listen actively.   Therapeutic Modalities:   Cognitive Behavioral Therapy Feelings Identification Dialectical Behavioral Therapy   August Saucer, LCSW 09/13/2016, 2:31 PM

## 2016-09-14 MED ORDER — ASPIRIN 81 MG PO TBEC: 81.0000 mg | DELAYED_RELEASE_TABLET | Freq: Every day | ORAL | 0 refills | Status: DC

## 2016-09-14 MED ORDER — ATORVASTATIN CALCIUM 80 MG PO TABS: 80.0000 mg | ORAL_TABLET | Freq: Every day | ORAL | 0 refills | Status: DC

## 2016-09-14 MED ORDER — NITROGLYCERIN 0.4 MG SL SUBL: 0.4000 mg | SUBLINGUAL_TABLET | SUBLINGUAL | 0 refills | Status: DC | PRN

## 2016-09-14 MED ORDER — TRAZODONE HCL 50 MG PO TABS
150.0000 mg | ORAL_TABLET | Freq: Every day | ORAL | Status: DC
  Administered 2016-09-14: 21:00:00 150 mg via ORAL
  Filled 2016-09-14: qty 1

## 2016-09-14 MED ORDER — CARVEDILOL 6.25 MG PO TABS: 6.2500 mg | ORAL_TABLET | Freq: Two times a day (BID) | ORAL | 0 refills | Status: DC

## 2016-09-14 MED ORDER — ASENAPINE MALEATE 5 MG SL SUBL: 10.0000 mg | SUBLINGUAL_TABLET | Freq: Two times a day (BID) | SUBLINGUAL | 0 refills | Status: DC

## 2016-09-14 MED ORDER — GABAPENTIN 400 MG PO CAPS: 800.0000 mg | ORAL_CAPSULE | Freq: Four times a day (QID) | ORAL | 0 refills | Status: DC

## 2016-09-14 MED ORDER — LISINOPRIL 10 MG PO TABS: 10.0000 mg | ORAL_TABLET | Freq: Every day | ORAL | 0 refills | Status: DC

## 2016-09-14 MED ORDER — TRAZODONE HCL 150 MG PO TABS: 150.0000 mg | ORAL_TABLET | Freq: Every day | ORAL | 0 refills | Status: DC

## 2016-09-14 MED ORDER — MIRTAZAPINE 45 MG PO TABS: 45.0000 mg | ORAL_TABLET | Freq: Every day | ORAL | 0 refills | Status: DC

## 2016-09-14 MED ORDER — METFORMIN HCL 500 MG PO TABS: 500.0000 mg | ORAL_TABLET | Freq: Two times a day (BID) | ORAL | 0 refills | Status: DC

## 2016-09-14 MED ORDER — CLOPIDOGREL BISULFATE 75 MG PO TABS: 75.0000 mg | ORAL_TABLET | Freq: Every day | ORAL | 0 refills | Status: DC

## 2016-09-14 NOTE — Progress Notes (Signed)
[facility] MD Progress Note  09/14/2016 9:17 AM Michael Schmitt  MRN:  [id number] Subjective:   Patient is a 46 year old male with history of major depressive disorder, benzodiazepine and alcohol abuse. The patient presented under involuntary commitment to our emergency department on June 18. Apparently the patient hung a rope outside his home and was going to hang himself. Unclear as to who called mobile crisis.  While in the ER the patient was hitting himself, the bed and yelling.  Patient has had 20 visits in the emergency department over the last 6 months. He has a long history of med seeking behaviors. Patient usually states he is suicidal and requests benzodiazepines in order to help his symptoms.  In addition patient reports that he received an eviction notice from his landlord.  Patient states that for many years he was managed with high doses of clonazepam. His psychiatrist discontinued it in February 2018, since then the patient reports that he has been much worse and constantly having suicidal ideation. He also frequently thinks about hurting others but nobody in specific. Patient complains of feeling afraid of leaving his house, he cannot elaborate why. Patient reported depressed mood, insomnia, anergia, poor appetite, poor concentration, suicidality and anhedonia. He denies auditory or visual hallucinations. Patient denies any symptoms consistent with mania or hypomania.  6/20 patient says he is not doing well. Has multiple complaints. Says he can't sleep with trazodone. He once the Ambien back. He wants to see what medication changes are going to be made today because he doesn't feel any better. He reports not to be on this earth. He is very manipulative. Every time he hears an answer he dislikes he says that he rather be dead. Yesterday he told me his Medicaid had expired today he tells me his medications not spiral. He told me he was evicted yesterday but today tells me he has not  received any formal  documentation of eviction  6/21 patient was lying in bed. Says that he has not been able to sleep well. Says that last night he did not receive trazodone. He does not feel better. He says he doesn't know what to do. We talked about the possibilities of having him placed in the future once he really applies for Medicaid. He was encouraged to stay out of the hospital and proceed with the Medicaid application and attend his outpatient appointments. We discussed how suicide in a parent increases the suicidal risk for their children.   Per nursing: Patient crying and walking about in his room, "I just want to die, I don't want to be here anymore .Marland Kitchen.." Spiritual Consult placed to provided support.  "Dr. Jerilee Schmitt said I am going home on Friday, I don't want to go home, I will kill myself if I go home. I want to go to [facility] in [place] .Marland Kitchen..do you think she will send me to [facility].." Patient is communicating threat of SIB (Head banging, smacking and punching self), patient constantly perseverating about going to [facility]; "If I don't hit myself, she will send me there? I think if I hit myself she will understand that I am not ready to go home, I want to go to a long care environment..."  Principal Problem: MDD (major depressive disorder), severe ([place]) Diagnosis:   Patient Active Problem List   Diagnosis Date Noted  . Coronary artery disease [I25.10] 09/12/2016  . MDD (major depressive disorder), severe ([place]) [F32.2] 09/12/2016  . HTN (hypertension) [I10] 06/26/2016  . GERD (gastroesophageal reflux disease) [  K21.9] 06/26/2016  . Alcohol use disorder, severe, 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[place]) [F10.21] 06/26/2016  . Sedative, hypnotic or anxiolytic use disorder, severe, dependence ([place]) [F13.20] 05/12/2016  . Ventricular fibrillation ([place]) [I49.01] 04/05/2016  . 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 Cervical spondylosis without myelopathy [M47.812] 07/24/2014  . Tobacco use disorder [F17.200]  10/20/2010  . Asthma [J45.909] 10/20/2010   Total Time spent with patient: 30 minutes  Past Psychiatric History: Patient reports that he has had at least 5 hospitalizations in his life, so basically most of them had happened this year as a result of the charges he received last year.  He has been a patient of Dr. Kasandra Knudsen at [facility] in [place] for more than 14 years. He was treated with high-dose clonazepam. High-dose clonazepam was discontinued several months ago as the patient was also drinking.  Pertinent [place] controlled substance database appears that the patient was just discharged from [facility] and was prescribed Ambien   Past Medical History:  Past Medical History:  Diagnosis Date  . Anxiety   . Anxiety   . Arthritis    cerv. stenosis, spondylosis, HNP- lower back , has been followed in pain clinic, has  had injection s in cerv. area  . Blood dyscrasia    told that when he was young he was a" free bleeder"  . CAD (coronary artery disease)   . Cervical spondylosis without myelopathy 07/24/2014  . Cervicogenic headache 07/24/2014  . Chronic kidney disease    renal calculi- passed spontaneously  . Depression   . Diabetes mellitus without complication ([place])   . Fatty liver   . GERD (gastroesophageal reflux disease)   . Headache(784.0)   . Hyperlipidemia   . Hypertension   . Mental disorder   . MI, old   . RLS (restless legs syndrome)    detected on sleep study  . Shortness of breath   . Ventricular fibrillation ([place]) 04/05/2016    Past Surgical History:  Procedure Laterality Date  . ANTERIOR CERVICAL DECOMP/DISCECTOMY FUSION  11/13/2011   Procedure: ANTERIOR CERVICAL DECOMPRESSION/DISCECTOMY FUSION 1 LEVEL;  Surgeon: [name], MD;  Location: [place] [facility] OR[facility];  Service: Neurosurgery;  Laterality: N/A;  Anterior Cervical Decompression/discectomy Fusion. Cervical three-four.  Marland Kitchen CARDIAC CATHETERIZATION N/A 01/01/2015   Procedure: Left Heart Cath and Coronary Angiography;   Surgeon: [name], MD;  Location: [place] CV LAB;  Service: Cardiovascular;  Laterality: N/A;  . CARDIAC CATHETERIZATION N/A 04/05/2016   Procedure: Left Heart Cath and Coronary Angiography;  Surgeon: Belva Crome, MD;  Location: [place] CV LAB;  Service: Cardiovascular;  Laterality: N/A;  . CARDIAC CATHETERIZATION N/A 04/05/2016   Procedure: Coronary Stent Intervention;  Surgeon: Belva Crome, MD;  Location: [place] CV LAB;  Service: Cardiovascular;  Laterality: N/A;  . CARDIAC CATHETERIZATION N/A 04/05/2016   Procedure: Intravascular Ultrasound/IVUS;  Surgeon: Belva Crome, MD;  Location: [place] CV LAB;  Service: Cardiovascular;  Laterality: N/A;  . NASAL SINUS SURGERY     2005   Family History:  Family History  Problem Relation Age of Onset  . Prostate cancer Father   . Hypertension Mother   . Kidney Stones Mother   . Anxiety disorder Mother   . Depression Mother   . COPD Sister   . Hypertension Sister   . Diabetes Sister   . Depression Sister   . Anxiety disorder Sister   . Seizures Sister   . ADD / ADHD Son   . ADD /  ADHD Daughter    Family Psychiatric  History: Paternal grandmother suffered from depression. Patient's mother was diagnosed with depression. No drug use or alcoholism in the family  Social History: Patient has been disabled for years after having an injury in his neck. 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 He lives with his mother, sister and the patient's 2 children who are 41 and 12 years old. The patient's first wife died many years ago years ago. He had a long relationship after that but never got married. He had 2 children with this partner.   Highest level of education is 11th grade History  Alcohol Use No    Comment: last drinked in 3 months.      History  Drug Use No    Social History   Social History  . Marital status: Widowed    Spouse name: N/A  . Number of children: 3  . Years of education: 12   Occupational  History  .      unemployed   Social History Main Topics  . Smoking status: Current Every Day Smoker    Packs/day: 2.00    Years: 17.00    Types: Cigarettes, Cigars  . Smokeless tobacco: Former Systems developer     Comment: occassional snuff   . Alcohol use No     Comment: last drinked in 3 months.   . Drug use: No  . Sexual activity: Not Currently   Other Topics Concern  . None   Social History Narrative   Patient is right handed.   Patient drinks 2 sodas daily.   Additional Social History:    History of alcohol / drug use?: No history of alcohol / drug abuse (Patient denies alcohol use in 4 months)    Current Medications: Current Facility-Administered Medications  Medication Dose Route Frequency Provider Last Rate Last Dose  . acetaminophen (TYLENOL) tablet 1,000 mg  1,000 mg Oral Q6H PRN Hildred Priest, MD      . alum & mag hydroxide-simeth (MAALOX/MYLANTA) 200-200-20 MG/5ML suspension 30 mL  30 mL Oral Q4H PRN Clapacs, John T, MD      . asenapine (SAPHRIS) sublingual tablet 10 mg  10 mg Sublingual BID Clapacs, Madie Reno, MD   10 mg at 09/14/16 0856  . aspirin EC tablet 81 mg  81 mg Oral Daily Clapacs, Madie Reno, MD   81 mg at 09/14/16 0857  . atorvastatin (LIPITOR) tablet 80 mg  80 mg Oral q1800 Clapacs, Madie Reno, MD   80 mg at 09/13/16 1651  . carvedilol (COREG) tablet 6.25 mg  6.25 mg Oral BID WC Clapacs, Madie Reno, MD   6.25 mg at 09/14/16 0856  . clopidogrel (PLAVIX) tablet 75 mg  75 mg Oral Daily Clapacs, Madie Reno, MD   75 mg at 09/14/16 0857  . cyclobenzaprine (FLEXERIL) tablet 7.5 mg  7.5 mg Oral TID PRN Hildred Priest, MD   7.5 mg at 09/14/16 0113  . gabapentin (NEURONTIN) capsule 800 mg  800 mg Oral QID Clapacs, John T, MD   800 mg at 09/14/16 0856  . hydrOXYzine (ATARAX/VISTARIL) tablet 25 mg  25 mg Oral Q6H PRN Hildred Priest, MD   25 mg at 09/14/16 0112  . lisinopril (PRINIVIL,ZESTRIL) tablet 5 mg  5 mg Oral Daily Clapacs, Madie Reno, MD   5 mg at  09/13/16 0910  . magnesium hydroxide (MILK OF MAGNESIA) suspension 30 mL  30 mL Oral Daily PRN Clapacs, Madie Reno, MD      .  meloxicam (MOBIC) tablet 7.5 mg  7.5 mg Oral BID PRN Hildred Priest, MD      . metFORMIN (GLUCOPHAGE) tablet 500 mg  500 mg Oral BID WC Hildred Priest, MD   500 mg at 09/14/16 0856  . mirtazapine (REMERON) tablet 45 mg  45 mg Oral QHS Clapacs, Madie Reno, MD   45 mg at 09/13/16 2109  . nicotine (NICODERM CQ - dosed in mg/24 hours) patch 21 mg  21 mg Transdermal Daily Pucilowska, Jolanta B, MD   21 mg at 09/14/16 0900    Lab Results:  Results for orders placed or performed during the hospital encounter of 09/11/16 (from the past 48 hour(s))  Glucose, capillary     Status: Abnormal   Collection Time: 09/12/16  8:42 PM  Result Value Ref Range   Glucose-Capillary 192 (H) 65 - 99 mg/dL  Glucose, capillary     Status: Abnormal   Collection Time: 09/13/16  6:08 AM  Result Value Ref Range   Glucose-Capillary 183 (H) 65 - 99 mg/dL   Comment 1 Notify RN    Comment 2 Document in Chart   Glucose, capillary     Status: Abnormal   Collection Time: 09/13/16  8:10 PM  Result Value Ref Range   Glucose-Capillary 115 (H) 65 - 99 mg/dL   Comment 1 Notify RN     Blood Alcohol level:  Lab Results  Component Value Date   ETH <5 09/11/2016   ETH <5 78/46/9629    Metabolic Disorder Labs: Lab Results  Component Value Date   HGBA1C 7.8 (H) 09/12/2016   MPG 177 09/12/2016   MPG 174 08/03/2016   Lab Results  Component Value Date   PROLACTIN 18.1 (H) 09/12/2016   PROLACTIN 6.5 05/27/2016   Lab Results  Component Value Date   CHOL 126 09/12/2016   TRIG 281 (H) 09/12/2016   HDL 33 (L) 09/12/2016   CHOLHDL 3.8 09/12/2016   VLDL 56 (H) 09/12/2016   LDLCALC 37 09/12/2016   LDLCALC 44 08/03/2016    Physical Findings: AIMS: Facial and Oral Movements Muscles of Facial Expression: None, normal Lips and Perioral Area: None, normal Jaw: None,  normal Tongue: None, normal,Extremity Movements Upper (arms, wrists, hands, fingers): None, normal Lower (legs, knees, ankles, toes): None, normal, Trunk Movements Neck, shoulders, hips: None, normal, Overall Severity Severity of abnormal movements (highest score from questions above): None, normal Incapacitation due to abnormal movements: None, normal Patient's awareness of abnormal movements (rate only patient's report): No Awareness, Dental Status Current problems with teeth and/or dentures?: No Does patient usually wear dentures?: No  CIWA:    COWS:     Musculoskeletal: Strength & Muscle Tone: within normal limits Gait & Station: normal Patient leans: N/A  Psychiatric Specialty Exam: Physical Exam  Constitutional: He appears well-developed and well-nourished.  HENT:  Head: Normocephalic and atraumatic.  Respiratory: Effort normal.  Musculoskeletal: Normal range of motion.  Neurological: He is alert.    Review of Systems  Constitutional: Negative.   HENT: Negative.   Eyes: Negative.   Respiratory: Negative.   Cardiovascular: Negative.   Gastrointestinal: Negative.   Genitourinary: Negative.   Musculoskeletal: Negative.   Skin: Negative.   Neurological: Negative.   Endo/Heme/Allergies: Negative.   Psychiatric/Behavioral: Positive for depression and suicidal ideas. Negative for hallucinations and substance abuse. The patient is nervous/anxious. The patient does not have insomnia.     Blood pressure (!) 144/86, pulse 79, temperature 97.8 F (36.6 C), temperature source Oral, resp. rate 18, height  6\' 2"  (1.88 m), weight 88.5 kg (195 lb), SpO2 97 %.Body mass index is 25.04 kg/m.  General Appearance: Well Groomed  Eye Contact:  Good  Speech:  Clear and Coherent  Volume:  Decreased  Mood:  Dysphoric  Affect:  Blunt  Thought Process:  Linear and Descriptions of Associations: Intact  Orientation:  Full (Time, Place, and Person)  Thought Content:  Hallucinations: None   Suicidal Thoughts:  No  Homicidal Thoughts:  No  Memory:  Immediate;   Fair Recent;   Fair Remote;   Fair  Judgement:  Poor  Insight:  Lacking  Psychomotor Activity:  Decreased  Concentration:  Concentration: Poor and Attention Span: Poor  Recall:  [employer] of Knowledge:  Fair  Language:  Good  Akathisia:  No  Handed:    AIMS (if indicated):     Assets:  Communication Skills  ADL's:  Intact  Cognition:  WNL  Sleep:  Number of Hours: 6.15     Treatment Plan Summary:  Patient is a 46 year old with depression, chronic anxiety and substance abuse issues. Patient has very limited coping abilities. He tells me he has received eviction notice to 2 prior legal charges of DWIs.  Currently homeless   Major depressive disorder and insomnia. The patient continues to state he has great difficulty sleeping. I will continue him on mirtazapine 45 mg at bedtime and trazodone 150 mg qhs  Anxiety: Continue Saphris 10 mg twice a day. Patient has refractory anxiety that has failed multiple treatments. Patient has displayed significant issues with med seeking behaviors. He is frequently in the emergency department requesting Ativan or Klonopin  Patient was told yesterday during treatment team meeting that we had the plan of discharging him this Friday. He was explained to him that because he does not have Medicaid we cannot help him with placement at this point in time. He will have to go to social services and apply for Medicaid. He is able to return to his current living situation as he has not received a formal eviction notice. He has a supportive family. Since he was told about discharge he is started saying just today again that he wants to kill himself and that now he was to be transferred to [facility].  [facility] is not a long-term care facility. Patient does not understand that. They will not be likely to admit this patient any sooner than 90 days as they have a very long wait list for patients. Patient is  admitted to [facility] are  Violent, aggressive and unmanageable. He does not meet any criteria for transfer to [facility].  Mr. Pasquariello is very manipulative. He certainly appears to be exaggerating his symptoms in order to prevent discharge.   Patient has at this point chronic suicidality. I do not feel that prolonging any inpatient services are going to be of any benefit to him. Prolonged inpatient care is only encouraging, enabling, reinforcing maladaptive behavior.  The evidence to support my statements that the fact that he has been hospitalized a multitude of times this year. He has failed to improve after any of those hospitalizations. He has failed to stay out of the hospital for more than a few days after discharges. He has failed to follow-up. His medication regimen has been change numerous times and patient claims "nothing helps"  ECT has evaluated him and they do not feel that ECT services will be of any benefit to this patient as this appears to be more behavioral. I discussed  this with Dr. Ignacia Bayley.  Alcohol use disorder patient has been in remission for several months.   Chronic pain: Continue Flexeril 7.5 mg tid when necessary and add meloxican 7.5mg  bidwhen necessary. Continue Neurontin 800 mg 4 times a day  Tobacco use disorder: Continuenicotine patch 21 mg  HTN: continue coreg 6.5 mg bid and lisinopril 5 mg daily  Diabetes continue metformin 500 mg twice a day  Dyslipidemia: Continue Lipitor 80 mg a day   Cardiovascular disease continue aspirin 81 mg a day and brilinta  Patient has been somewhat manipulative telling me that he will kill himself and that he feels like hurting others. He continues to request benzodiazepines. In addition to his problems with depression and anxiety the patient tells me he was also evicted from his home because of his criminal record (2 previous DWIs). Patient told me yesterday that he is homeless but he has not received a  written notification of addiction  He let his Medicaid expired. He is requesting a family care home, which will be appropriate, however he he has no funding for the payment of placement as he no longer has medicaid.  Labs have been reviewed. He has a normal TSH. Urine toxicology is only positive for TCAs. Alcohol level was below the detection limit.  Precautions every 15 minute checks  Hospitalization and status will change to voluntary  Diet low sodium and carb modified  Vital signs daily  Dispo: To be determined  Follow-up to be determined.  Hildred Priest, MD 09/14/2016, 9:17 AM

## 2016-09-14 NOTE — Progress Notes (Signed)
Patient crying and walking about in his room, "I just want to die, I don't want to be here anymore .Marland Kitchen.." Spiritual Consult placed to provided support.

## 2016-09-14 NOTE — Progress Notes (Signed)
D: Pt A & O to self, place and situation. Denies HI, AVH and pain. Endorsed passive HI, "I just don't want to live anymore, I want to go to [place], I feel like y'all not helping me to go to [facility]". Pt denies plan when assessed. Presents with blunted affect, fidgety, restless, observed pacing halls majority of this shift.Verbally threatening with intermittent verbal outburst.  A: Scheduled and PRN medications administered as per MD's order, effects monitored. Encourage pt to voice concerns and comply with current treatment regimen. Q 15 minutes safety checks maintained.  R: Pt calm at present. Remains medication compliant. Tolerates all PO intake well. Remains safe on unit.

## 2016-09-14 NOTE — Plan of Care (Addendum)
Problem: Activity: Goal: Sleeping patterns will improve Outcome: Progressing Patient slept for Estimated Hours of 6.15; q15 minutes safety round maintained, no injury or falls during this shift.

## 2016-09-14 NOTE — [facility] Group Notes (Signed)
[place] LCSW Group Therapy Note  Type of Therapy and Topic:  Group Therapy:  Goals Group: SMART Goals  Participation Level:  Patient attended group on this date and minimally participated in the group discussion.   Description of Group:   The purpose of a daily goals group is to assist and guide patients in setting recovery/wellness-related goals.  The objective is to set goals as they relate to the crisis in which they were admitted. Patients will be using SMART goal modalities to set measurable goals.  Characteristics of realistic goals will be discussed and patients will be assisted in setting and processing how one will reach their goal. Facilitator will also assist patients in applying interventions and coping skills learned in psycho-education groups to the SMART goal and process how one will achieve defined goal.  Therapeutic Goals: -Patients will develop and document one goal related to or their crisis in which brought them into treatment. -Patients will be guided by LCSW using SMART goal setting modality in how to set a measurable, attainable, realistic and time sensitive goal.  -Patients will process barriers in reaching goal. -Patients will process interventions in how to overcome and successful in reaching goal.   Summary of Patient Progress:  Patient Goal:  "I want to work on managing my anxiety better". Pt would like to develop new coping skills to manage his anxiety.    Therapeutic Modalities:   Motivational Interviewing Public relations account executive Therapy Crisis Intervention Model SMART goals setting  Tumeka Chimenti G. [place], [facility] 09/14/2016 10:26 AM
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
## 2016-09-14 NOTE — B[facility] Group Notes (Signed)
[place] LCSW Group Therapy  09/14/2016 10:26 AM  Type of Therapy:  Group Therapy  Participation Level:  Patient did not attend group. CSW invited patient to group.   Summary of Progress/Problems: Balance in life: Patients will discuss the concept of balance and how it looks and feels to be unbalanced. Pt will identify areas in their life that is unbalanced and ways to become more balanced. They discussed what aspects in their lives has influenced their self care. Patients also discussed self care in the areas of self regulation/control, hygiene/appearance, sleep/relaxation, healthy leisure, healthy eating habits, exercise, inner peace/spirituality, self improvement, sobriety, and health management. They were challenged to identify changes that are needed in order to improve self care.  Kamala Kolton G. Claybon Jabs MSW, [place] 09/14/2016, 10:27 AM

## 2016-09-14 NOTE — Plan of Care (Signed)
Problem: Medication: Goal: Compliance with prescribed medication regimen will improve Outcome: Progressing Compliant with medications when offered. Denies adverse drug reactions when assessed.

## 2016-09-15 DIAGNOSIS — R45851 Suicidal ideations: Secondary | ICD-10-CM

## 2016-09-15 HISTORY — DX: Suicidal ideations: R45.851

## 2016-09-15 MED ORDER — LORAZEPAM 2 MG/ML IJ SOLN
INTRAMUSCULAR | Status: AC
  Filled 2016-09-15: qty 1

## 2016-09-15 MED ORDER — LORAZEPAM 2 MG/ML IJ SOLN
1.0000 mg | Freq: Once | INTRAMUSCULAR | Status: AC
  Administered 2016-09-15: 1 mg via INTRAMUSCULAR

## 2016-09-15 NOTE — Progress Notes (Signed)
  [facility] Adult Case Management Discharge Plan :  Will you be returning to the same living situation after discharge:  Yes,  returning home At discharge, do you have transportation home?: Yes,  sister will pick pt up. Do you have the ability to pay for your medications: Yes,  insurance coverage.  Release of information consent forms completed and in the chart;  Patient's signature needed at discharge.  Patient to Follow up at: Follow-up Information    [facility], [place] on 09/21/2016.   Why:  Please follow-up with Dr. Kasandra Knudsen on Thursday, June 28th at [facility]. Please bring discharge info. to appointment. If you have to cancel, please call ahead to cancel! Contact information: [place] [address] [phone]        [facility]. Go on 09/20/2016.   Why:  Your appointment is at 8:00 am. Please bring a list of your current medications and your insurance card with you. Contact information: [place] [address] [phone]           Next level of care provider has access to [place] and Suicide Prevention discussed: Yes,  SPE completed with patient's sister.  Have you used any form of tobacco in the last 30 days? (Cigarettes, Smokeless Tobacco, Cigars, and/or Pipes): Yes  Has patient been referred to the Quitline?: Patient refused referral  Patient has been referred for addiction treatment: Pt. refused referral  Emilie Rutter, MSW, LCSW-A 09/15/2016, 12:13 PM

## 2016-09-15 NOTE — Progress Notes (Signed)
   09/15/16 1010  Clinical Encounter Type  Visited With Patient  Visit Type Follow-up;Psychological support;Spiritual support;Behavioral Health;Social support  Referral From Nurse  Spiritual Encounters  Spiritual Needs Prayer;Ritual  Stress Factors  Patient Stress Factors Major life changes   Patient hearing voices. Patient is scared that he will hurt someone around him. Patient stated he was being possessed by a demon that keeps telling him to do bad things. Patient spoke about his medication that he had been given and how it had eased the voice in his head. Patient spoke about previous drugs that helped him. Patient is afraid that it will continue to happen. Chaplain offered emotional support, patience, attentive listening, and offered a few practical tips to cleanse his area of demonic attachments.

## 2016-09-15 NOTE — BHH Suicide Risk Assessment (Signed)
[facility] Discharge Suicide Risk Assessment   Principal Problem: MDD (major depressive disorder), severe [facility]) Discharge Diagnoses:  Patient Active Problem List   Diagnosis Date Noted  . Coronary artery disease [I25.10] 09/12/2016  . MDD (major depressive disorder), severe ([place]) [F32.2] 09/12/2016  . HTN (hypertension) [I10] 06/26/2016  . GERD (gastroesophageal reflux disease) [K21.9] 06/26/2016  . Alcohol use disorder, severe, 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[place]) [F10.21] 06/26/2016  . Sedative, hypnotic or anxiolytic use disorder, severe, dependence ([place]) [F13.20] 05/12/2016  . Ventricular fibrillation ([place]) [I49.01] 04/05/2016  . 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 Cervical spondylosis without myelopathy [M47.812] 07/24/2014  . Tobacco use disorder [F17.200] 10/20/2010  . Asthma [J45.909] 10/20/2010     Psychiatric Specialty Exam: ROS  Blood pressure 107/73, pulse 70, temperature 98.4 F (36.9 C), temperature source Oral, resp. rate 18, height 6\' 2"  (1.88 m), weight 88.5 kg (195 lb), SpO2 97 %.Body mass index is 25.04 kg/m.                                                       Mental Status Per Nursing Assessment::   On Admission:     Demographic Factors:  Male, Divorced or widowed, Caucasian and Low socioeconomic status  Loss Factors: Decrease in vocational status  Historical Factors: Impulsivity  Risk Reduction Factors:   Responsible for children under 64 years of age, Sense of responsibility to family, Religious beliefs about death, Living with another person, especially a relative and Positive social support  No access to guns  Continued Clinical Symptoms:  Depression:   Impulsivity Alcohol/Substance Abuse/Dependencies Chronic Pain Previous Psychiatric Diagnoses and Treatments  Cognitive Features That Contribute To Risk:  Closed-mindedness    Suicide Risk:  Minimal: No identifiable suicidal ideation.  Patients presenting with no risk  factors but with morbid ruminations; may be classified as minimal risk based on the severity of the depressive symptoms  Follow-up Information    [facility], [place] on 09/21/2016.   Why:  Please follow-up with Dr. Kasandra Knudsen on Thursday, June 28th at [facility]. Please bring discharge info. to appointment. If you have to cancel, please call ahead to cancel! Contact information: [place] [address] [phone]        [facility]. Go on 09/20/2016.   Why:  Your appointment is at 8:00 am. Please bring a list of your current medications and your insurance card with you. Contact information: [place] [address] [phone]             Hildred Priest, MD 09/15/2016, 12:01 PM

## 2016-09-15 NOTE — Progress Notes (Signed)
Patient currently pacing the halls since he was awakened by another patient.  Patient states, "I'm tired.  I just want to sleep.  I don't want to live anymore.  I'm tired of this.  Can you get me discharged soon?"

## 2016-09-15 NOTE — BHH Group Notes (Signed)
[facility] LCSW Group Therapy  09/15/2016 10:38 AM  Type of Therapy:  Group Therapy  Participation Level:  Patient did not attend group. CSW invited patient to group.   Summary of Progress/Problems: Feelings around Relapse. Group members discussed the meaning of relapse and shared personal stories of relapse, how it affected them and others, and how they perceived themselves during this time. Group members were encouraged to identify triggers, warning signs and coping skills used when facing the possibility of relapse. Social supports were discussed and explored in detail. Patients also discussed facing disappointment and how that can trigger someone to relapse.  Jigar Zielke G. [place], [place] 09/15/2016, 10:38 AM

## 2016-09-15 NOTE — Discharge Planning (Signed)
L.E.- Spoke with  [facility] Post Discharge Out Patient [place] [phone] ext [phone]. Discussed pending discharge and aftercare, frequency of readmission to various hospitals. Advised Avis that this Probation officer will call her after appointment date to make sure patient kept his appointment.

## 2016-09-15 NOTE — Progress Notes (Signed)
Patient has multiple verbal outbursts throughout the night.  Staff unable to decipher what was said.  Patient now reports having "demons" inside him causing the outbursts.

## 2016-09-15 NOTE — Progress Notes (Signed)
Pt sleeping in room after receiving PRN IM medications for agitation.  Pt awoke calm and cooperative, med compliant.  Pt reports is "scared I'll hurt myself or my family."  Denies plan or intent.  Contracts for safety during.  Denies current SI/HI.  Reports "demons in my head" but will not elaborate.  Discharge orders received.  Discharge instructions reviewed with pt including follow up appts, prescriptions and crisis help lines.  Pt verbalized understanding.  All belongings returned to pt.  Escorted off unit, sister picking up.  No acute distress.

## 2016-09-15 NOTE — Progress Notes (Signed)
Patient in room yelling, "I have demons in me!  I don't like feeling like this!  I'm going to hurt somebody!"  On call MD contacted.  Order for 1 mg Ativan given.

## 2016-09-16 ENCOUNTER — Emergency Department: Payer: Medicare Other

## 2016-09-16 ENCOUNTER — Emergency Department
Admission: EM | Admit: 2016-09-16 | Discharge: 2016-09-17 | Disposition: A | Discharge status: Home / Self Care | Payer: Medicare Other | Attending: Emergency Medicine | Admitting: Emergency Medicine

## 2016-09-16 DIAGNOSIS — F17210 Nicotine dependence, cigarettes, uncomplicated: Secondary | ICD-10-CM | POA: Diagnosis not present

## 2016-09-16 DIAGNOSIS — S0990XA Unspecified injury of head, initial encounter: Secondary | ICD-10-CM | POA: Insufficient documentation

## 2016-09-16 DIAGNOSIS — Y929 Unspecified place or not applicable: Secondary | ICD-10-CM | POA: Insufficient documentation

## 2016-09-16 DIAGNOSIS — F329 Major depressive disorder, single episode, unspecified: Secondary | ICD-10-CM

## 2016-09-16 DIAGNOSIS — E1122 Type 2 diabetes mellitus with diabetic chronic kidney disease: Secondary | ICD-10-CM | POA: Diagnosis not present

## 2016-09-16 DIAGNOSIS — Y999 Unspecified external cause status: Secondary | ICD-10-CM | POA: Diagnosis not present

## 2016-09-16 DIAGNOSIS — F332 Major depressive disorder, recurrent severe without psychotic features: Secondary | ICD-10-CM | POA: Diagnosis not present

## 2016-09-16 DIAGNOSIS — I129 Hypertensive chronic kidney disease with stage 1 through stage 4 chronic kidney disease, or unspecified chronic kidney disease: Secondary | ICD-10-CM | POA: Diagnosis not present

## 2016-09-16 DIAGNOSIS — J45909 Unspecified asthma, uncomplicated: Secondary | ICD-10-CM | POA: Insufficient documentation

## 2016-09-16 DIAGNOSIS — Y939 Activity, unspecified: Secondary | ICD-10-CM | POA: Insufficient documentation

## 2016-09-16 DIAGNOSIS — I2510 Atherosclerotic heart disease of native coronary artery without angina pectoris: Secondary | ICD-10-CM | POA: Diagnosis not present

## 2016-09-16 DIAGNOSIS — W2209XA Striking against other stationary object, initial encounter: Secondary | ICD-10-CM | POA: Insufficient documentation

## 2016-09-16 DIAGNOSIS — F32A Depression, unspecified: Secondary | ICD-10-CM

## 2016-09-16 DIAGNOSIS — N189 Chronic kidney disease, unspecified: Secondary | ICD-10-CM | POA: Diagnosis not present

## 2016-09-16 LAB — COMPREHENSIVE METABOLIC PANEL
ALT: 26 U/L (ref 17–63)
ANION GAP: 9 (ref 5–15)
AST: 35 U/L (ref 15–41)
Albumin: 4.5 g/dL (ref 3.5–5.0)
Alkaline Phosphatase: 112 U/L (ref 38–126)
BILIRUBIN TOTAL: 0.4 mg/dL (ref 0.3–1.2)
BUN: 14 mg/dL (ref 6–20)
CHLORIDE: 103 mmol/L (ref 101–111)
CO2: 23 mmol/L (ref 22–32)
Calcium: 9.2 mg/dL (ref 8.9–10.3)
Creatinine, Ser: 0.98 mg/dL (ref 0.61–1.24)
GFR calc Af Amer: >60 mL/min (ref >60)
Glucose, Bld: 261 mg/dL — ABNORMAL HIGH (ref 65–99)
POTASSIUM: 4.1 mmol/L (ref 3.5–5.1)
Sodium: 135 mmol/L (ref 135–145)
TOTAL PROTEIN: 7.6 g/dL (ref 6.5–8.1)

## 2016-09-16 LAB — URINE DRUG SCREEN, QUALITATIVE (ARMC ONLY)
AMPHETAMINES, UR SCREEN: NOT DETECTED
BENZODIAZEPINE, UR SCRN: POSITIVE — AB
Barbiturates, Ur Screen: NOT DETECTED
Cannabinoid 50 Ng, Ur ~~LOC~~: NOT DETECTED
Cocaine Metabolite,Ur ~~LOC~~: NOT DETECTED
MDMA (ECSTASY) UR SCREEN: NOT DETECTED
Methadone Scn, Ur: NOT DETECTED
Opiate, Ur Screen: NOT DETECTED
PHENCYCLIDINE (PCP) UR S: NOT DETECTED
Tricyclic, Ur Screen: POSITIVE — AB

## 2016-09-16 LAB — CBC WITH DIFFERENTIAL/PLATELET
BASOS ABS: 0.1 10*3/uL (ref 0–0.1)
Basophils Relative: 1 %
EOS PCT: 2 %
Eosinophils Absolute: 0.2 10*3/uL (ref 0–0.7)
HCT: 41.5 % (ref 40.0–52.0)
HEMOGLOBIN: 14.2 g/dL (ref 13.0–18.0)
LYMPHS ABS: 1.8 10*3/uL (ref 1.0–3.6)
LYMPHS PCT: 17 %
MCH: 29.5 pg (ref 26.0–34.0)
MCHC: 34.2 g/dL (ref 32.0–36.0)
MCV: 86.3 fL (ref 80.0–100.0)
Monocytes Absolute: 0.7 10*3/uL (ref 0.2–1.0)
Monocytes Relative: 7 %
NEUTROS PCT: 73 %
Neutro Abs: 7.5 10*3/uL — ABNORMAL HIGH (ref 1.4–6.5)
PLATELETS: 221 10*3/uL (ref 150–440)
RBC: 4.81 MIL/uL (ref 4.40–5.90)
RDW: 14 % (ref 11.5–14.5)
WBC: 10.3 10*3/uL (ref 3.8–10.6)

## 2016-09-16 LAB — ACETAMINOPHEN LEVEL: Acetaminophen (Tylenol), Serum: <10 ug/mL — ABNORMAL LOW (ref 10–30)

## 2016-09-16 LAB — SALICYLATE LEVEL: Salicylate Lvl: <7 mg/dL (ref 2.8–30.0)

## 2016-09-16 LAB — ETHANOL

## 2016-09-16 MED ORDER — ZIPRASIDONE MESYLATE 20 MG IM SOLR
INTRAMUSCULAR | Status: AC
  Administered 2016-09-16: 20 mg via INTRAMUSCULAR
  Filled 2016-09-16: qty 20

## 2016-09-16 MED ORDER — ZIPRASIDONE MESYLATE 20 MG IM SOLR
20.0000 mg | Freq: Once | INTRAMUSCULAR | Status: AC
  Administered 2016-09-16: 20 mg via INTRAMUSCULAR

## 2016-09-16 MED ORDER — HALOPERIDOL LACTATE 5 MG/ML IJ SOLN
5.0000 mg | Freq: Once | INTRAMUSCULAR | Status: AC
  Administered 2016-09-16: 5 mg via INTRAMUSCULAR
  Filled 2016-09-16: qty 1

## 2016-09-16 MED ORDER — LORAZEPAM 2 MG/ML IJ SOLN
2.0000 mg | Freq: Once | INTRAMUSCULAR | Status: AC
  Administered 2016-09-16: 2 mg via INTRAMUSCULAR
  Filled 2016-09-16: qty 1

## 2016-09-16 NOTE — BHH Counselor (Signed)
Pt was sedated unable to assess

## 2016-09-16 NOTE — ED Provider Notes (Signed)
[facility] Emergency Department Provider Note  ____________________________________________   I have reviewed the triage vital signs and the nursing notes.   HISTORY  Chief Complaint Psychiatric Evaluation    HPI Michael Schmitt is a 46 y.o. male  who presents today complaining really wanted to hurt himself at this time.  He did bump his head and the wall. Did not pass out. Yelling and screaming, states he needs benzos. Has a history of benzo abuse.He denies overdose. He has no other medical complaints. He wants to live a mental hospital and that is why he is here.     Past Medical History:  Diagnosis Date  . Anxiety   . Anxiety   . Arthritis    cerv. stenosis, spondylosis, HNP- lower back , has been followed in pain clinic, has  had injection s in cerv. area  . Blood dyscrasia    told that when he was young he was a" free bleeder"  . CAD (coronary artery disease)   . Cervical spondylosis without myelopathy 07/24/2014  . Cervicogenic headache 07/24/2014  . Chronic kidney disease    renal calculi- passed spontaneously  . Depression   . Diabetes mellitus without complication ([place])   . Fatty liver   . GERD (gastroesophageal reflux disease)   . Headache(784.0)   . Hyperlipidemia   . Hypertension   . Mental disorder   . MI, old   . RLS (restless legs syndrome)    detected on sleep study  . Shortness of breath   . Ventricular fibrillation ([place]) 04/05/2016    Patient Active Problem List   Diagnosis Date Noted  . Chronic Suicidal ideation 09/15/2016  . Coronary artery disease 09/12/2016  . MDD (major depressive disorder), severe ([place]) 09/12/2016  . HTN (hypertension) 06/26/2016  . GERD (gastroesophageal reflux disease) 06/26/2016  . Alcohol use disorder, severe, 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[place]) 06/26/2016  . Sedative, hypnotic or anxiolytic use disorder, severe, dependence ([place]) 05/12/2016  . Ventricular fibrillation ([place]) 04/05/2016  . Combined  hyperlipidemia 01/05/2016  . Cervical spondylosis without myelopathy 07/24/2014  . Tobacco use disorder 10/20/2010  . Asthma 10/20/2010    Past Surgical History:  Procedure Laterality Date  . ANTERIOR CERVICAL DECOMP/DISCECTOMY FUSION  11/13/2011   Procedure: ANTERIOR CERVICAL DECOMPRESSION/DISCECTOMY FUSION 1 LEVEL;  Surgeon: [name], MD;  Location: [facility];  Service: Neurosurgery;  Laterality: N/A;  Anterior Cervical Decompression/discectomy Fusion. Cervical three-four.  [facility] CARDIAC CATHETERIZATION N/A 01/01/2015   Procedure: Left Heart Cath and Coronary Angiography;  Surgeon: [name], MD;  Location: [facility];  Service: Cardiovascular;  Laterality: N/A;  . CARDIAC CATHETERIZATION N/A 04/05/2016   Procedure: Left Heart Cath and Coronary Angiography;  Surgeon: Belva Crome, MD;  Location: [facility] [facility] CV LAB;  Service: Cardiovascular;  Laterality: N/A;  . CARDIAC CATHETERIZATION N/A 04/05/2016   Procedure: Coronary Stent Intervention;  Surgeon: Belva Crome, MD;  Location: [place] CV LAB;  Service: Cardiovascular;  Laterality: N/A;  . CARDIAC CATHETERIZATION N/A 04/05/2016   Procedure: Intravascular Ultrasound/IVUS;  Surgeon: Belva Crome, MD;  Location: [place] CV LAB;  Service: Cardiovascular;  Laterality: N/A;  . NASAL SINUS SURGERY     2005    Prior to Admission medications   Medication Sig Start Date End Date Taking? Authorizing Provider  asenapine (SAPHRIS) 5 MG SUBL 24 hr tablet Place 2 tablets (10 mg total) under the tongue 2 (two) times daily. 09/14/16   Hildred Priest, MD  aspirin EC  81 MG EC tablet Take 1 tablet (81 mg total) by mouth daily. 09/15/16   Hildred Priest, MD  atorvastatin (LIPITOR) 80 MG tablet Take 1 tablet (80 mg total) by mouth daily at 6 PM. 09/14/16   Hildred Priest, MD  carvedilol (COREG) 6.25 MG tablet Take 1 tablet (6.25 mg total) by mouth 2 (two) times daily with a meal. 09/14/16    Hildred Priest, MD  clopidogrel (PLAVIX) 75 MG tablet Take 1 tablet (75 mg total) by mouth daily. 09/15/16   Hildred Priest, MD  gabapentin (NEURONTIN) 400 MG capsule Take 2 capsules (800 mg total) by mouth 4 (four) times daily. 09/14/16   Hildred Priest, MD  lisinopril (PRINIVIL,ZESTRIL) 10 MG tablet Take 1 tablet (10 mg total) by mouth daily. 09/15/16   Hildred Priest, MD  metFORMIN (GLUCOPHAGE) 500 MG tablet Take 1 tablet (500 mg total) by mouth 2 (two) times daily with a meal. 09/14/16   Hildred Priest, MD  mirtazapine (REMERON) 45 MG tablet Take 1 tablet (45 mg total) by mouth at bedtime. 09/14/16   Hildred Priest, MD  nitroGLYCERIN (NITROSTAT) 0.4 MG SL tablet Place 1 tablet (0.4 mg total) under the tongue every 5 (five) minutes x 3 doses as needed. 09/14/16 09/14/17  Hildred Priest, MD  traZODone (DESYREL) 150 MG tablet Take 1 tablet (150 mg total) by mouth at bedtime. 09/14/16   Hildred Priest, MD    Allergies Prednisone; Hydrocodone; and Varenicline  Family History  Problem Relation Age of Onset  . Prostate cancer Father   . Hypertension Mother   . Kidney Stones Mother   . Anxiety disorder Mother   . Depression Mother   . COPD Sister   . Hypertension Sister   . Diabetes Sister   . Depression Sister   . Anxiety disorder Sister   . Seizures Sister   . ADD / ADHD Son   . ADD / ADHD Daughter     Social History Social History  Substance Use Topics  . Smoking status: Current Every Day Smoker    Packs/day: 2.00    Years: 17.00    Types: Cigarettes, Cigars  . Smokeless tobacco: Former Systems developer     Comment: occassional snuff   . Alcohol use No     Comment: last drinked in 3 months.     Review of Systems Constitutional: No fever/chills Eyes: No visual changes. ENT: No sore throat. No stiff neck no neck pain Cardiovascular: Denies chest pain. Respiratory: Denies shortness of  breath. Gastrointestinal:   no vomiting.  No diarrhea.  No constipation. Genitourinary: Negative for dysuria. Musculoskeletal: Negative lower extremity swelling Skin: Negative for rash. Neurological: Negative for severe headaches, focal weakness or numbness.   ____________________________________________   PHYSICAL EXAM:  VITAL SIGNS: ED Triage Vitals [09/16/16 1342]  Enc Vitals Group     BP (!) 165/94     Pulse Rate (!) 121     Resp 20     Temp      Temp src      SpO2 97 %     Weight 195 lb (88.5 kg)     Height 6\' 2"  (1.88 m)     Head Circumference      Peak Flow      Pain Score 8     Pain Loc      Pain Edu?      Excl. in [place]?     Constitutional: Anxious and angry and upset otherwise no acute distress Eyes: Conjunctivae are normal Head: Mild  bruising noted to the left cheek HEENT: No congestion/rhinnorhea. Mucous membranes are moist.  Oropharynx non-erythematous Neck:   Nontender with no meningismus, no masses, no stridor Cardiovascular: Tachycardia, regular rhythm. Grossly normal heart sounds.  Good peripheral circulation. Respiratory: Normal respiratory effort.  No retractions. Lungs CTAB. Abdominal: Soft and nontender. No distention. No guarding no rebound Back:  There is no focal tenderness or step off.  there is no midline tenderness there are no lesions noted. there is no CVA tenderness Musculoskeletal: No lower extremity tenderness, no upper extremity tenderness. No joint effusions, no DVT signs strong distal pulses no edema Neurologic:  Normal speech and language. No gross focal neurologic deficits are appreciated.  Skin:  Skin is warm, dry and intact. No rash noted. Psychiatric: Yelling and angry and anxious  ____________________________________________   LABS (all labs ordered are listed, but only abnormal results are displayed)  Labs Reviewed  CBC WITH DIFFERENTIAL/PLATELET - Abnormal; Notable for the following:       Result Value   Neutro Abs 7.5 (*)     All other components within normal limits  ETHANOL  COMPREHENSIVE METABOLIC PANEL  URINE DRUG SCREEN, QUALITATIVE ([facility] ONLY)   ____________________________________________  EKG  I personally interpreted any EKGs ordered by me or triage  ____________________________________________  RADIOLOGY  I reviewed any imaging ordered by me or triage that were performed during my shift and, if possible, patient and/or family made aware of any abnormal findings. ____________________________________________   PROCEDURES  Procedure(s) performed: None  Procedures  Critical Care performed: None  ____________________________________________   INITIAL IMPRESSION / ASSESSMENT AND PLAN / ED COURSE  Pertinent labs & imaging results that were available during my care of the patient were reviewed by me and considered in my medical decision making (see chart for details).  Patient likely here just seeking benzos but he is now trying to punch himself in the face we have given her Geodon and he still upset and angry, likely will have to give him other sedating medications. Some of this certainly is manipulative but at the same time patient certainly seems to have some significant medical mental illness. In order to stop himself or harming himself we will have to give him some medications.    ____________________________________________   FINAL CLINICAL IMPRESSION(S) / ED DIAGNOSES  Final diagnoses:  None      This chart was dictated using voice recognition software.  Despite best efforts to proofread,  errors can occur which can change meaning.      Schuyler Amor, MD 09/16/16 [phone]

## 2016-09-16 NOTE — ED Notes (Signed)
BEHAVIORAL HEALTH ROUNDING Patient sleeping: Yes.   Patient alert and oriented: not applicable SLEEPING Behavior appropriate: Yes.  ; If no, describe: SLEEPING Nutrition and fluids offered: No SLEEPING Toileting and hygiene offered: NoSLEEPING Sitter present: not applicable, Q 15 min safety rounds and observation. Law enforcement present: Yes ODS 

## 2016-09-16 NOTE — ED Triage Notes (Signed)
Pt was discharged yesterday, states they "took me off a lot of medications" and he wants [place] to admit him to a mental hospital to take care him

## 2016-09-16 NOTE — ED Notes (Addendum)
Pt yelling, stating that he wants to be knocked out, asking to transfer to [place], that he had demons in him. Pt is restless, agitated. Yelling one second and then calmly talking about getting meds. Dr Burlene Arnt notified and med odered

## 2016-09-16 NOTE — ED Notes (Signed)

## 2016-09-16 NOTE — ED Notes (Signed)
Pt yelling please get me some help, knocking bed siderails, hitting self with fist. Medic to bedside to monitor pt, Dr. Burlene Arnt notified and to bedside. meds ordered and given.

## 2016-09-16 NOTE — ED Notes (Signed)
Report given to [facility] Dr Christophe Louis.

## 2016-09-17 NOTE — ED Notes (Signed)
BEHAVIORAL HEALTH ROUNDING  Patient sleeping: No.  Patient alert and oriented: yes  Behavior appropriate: Yes. ; If no, describe:  Nutrition and fluids offered: Yes  Toileting and hygiene offered: Yes  Sitter present: not applicable, Q 15 min safety rounds and observation.  Law enforcement present: Yes ODS  

## 2016-09-17 NOTE — ED Provider Notes (Signed)
[facility] recommendations of the following: Patient is psychiatrically cleared for discharge to establish outpatient appointment June 28. In my medical opinion the patient is not currently appropriate for inpatient psychiatric admission. According to my experience with the patient, as well as the comments to the medical ED staff and past SO c-collar is, the patient is currently baseline and, furthermore, has received no appreciable benefit from inpatient admission for his chronic suicidal ideation-again, despite repeated inpatient psychiatric admissions over the last 3 months.  The patient will be discharged with outpatient follow-up.   Darel Hong, MD 09/17/16 [phone]

## 2016-09-17 NOTE — Discharge Instructions (Signed)
Please keep your appointment on June 28 as scheduled to see her psychiatrist. Return to the emergency department for any concerns.

## 2016-09-18 DIAGNOSIS — R9431 Abnormal electrocardiogram [ECG] [EKG]: Secondary | ICD-10-CM | POA: Diagnosis not present

## 2016-09-28 DIAGNOSIS — Z8674 Personal history of sudden cardiac arrest: Secondary | ICD-10-CM | POA: Diagnosis not present

## 2016-09-28 DIAGNOSIS — I252 Old myocardial infarction: Secondary | ICD-10-CM | POA: Diagnosis not present

## 2016-10-03 DIAGNOSIS — M542 Cervicalgia: Secondary | ICD-10-CM | POA: Diagnosis not present

## 2016-10-03 DIAGNOSIS — G8929 Other chronic pain: Secondary | ICD-10-CM | POA: Diagnosis not present

## 2016-10-05 DIAGNOSIS — G8929 Other chronic pain: Secondary | ICD-10-CM | POA: Diagnosis not present

## 2016-10-05 DIAGNOSIS — M542 Cervicalgia: Secondary | ICD-10-CM | POA: Diagnosis not present

## 2016-10-19 DIAGNOSIS — E119 Type 2 diabetes mellitus without complications: Secondary | ICD-10-CM | POA: Insufficient documentation

## 2016-10-19 DIAGNOSIS — M5412 Radiculopathy, cervical region: Secondary | ICD-10-CM | POA: Diagnosis not present

## 2016-10-19 DIAGNOSIS — I2510 Atherosclerotic heart disease of native coronary artery without angina pectoris: Secondary | ICD-10-CM | POA: Diagnosis not present

## 2016-10-19 DIAGNOSIS — Z125 Encounter for screening for malignant neoplasm of prostate: Secondary | ICD-10-CM | POA: Diagnosis not present

## 2016-10-19 DIAGNOSIS — E7800 Pure hypercholesterolemia, unspecified: Secondary | ICD-10-CM | POA: Diagnosis not present

## 2016-10-19 DIAGNOSIS — E1165 Type 2 diabetes mellitus with hyperglycemia: Secondary | ICD-10-CM

## 2016-10-19 DIAGNOSIS — I10 Essential (primary) hypertension: Secondary | ICD-10-CM | POA: Diagnosis not present

## 2016-10-28 ENCOUNTER — Emergency Department
Admission: EM | Admit: 2016-10-28 | Discharge: 2016-10-28 | Disposition: A | Discharge status: Home / Self Care | Payer: Medicare Other | Attending: Emergency Medicine | Admitting: Emergency Medicine

## 2016-10-28 ENCOUNTER — Encounter: Payer: Self-pay | Admitting: Emergency Medicine

## 2016-10-28 DIAGNOSIS — Z760 Encounter for issue of repeat prescription: Secondary | ICD-10-CM | POA: Insufficient documentation

## 2016-10-28 DIAGNOSIS — Z7982 Long term (current) use of aspirin: Secondary | ICD-10-CM | POA: Diagnosis not present

## 2016-10-28 DIAGNOSIS — I259 Chronic ischemic heart disease, unspecified: Secondary | ICD-10-CM | POA: Diagnosis not present

## 2016-10-28 DIAGNOSIS — Z79899 Other long term (current) drug therapy: Secondary | ICD-10-CM | POA: Diagnosis not present

## 2016-10-28 DIAGNOSIS — I10 Essential (primary) hypertension: Secondary | ICD-10-CM | POA: Insufficient documentation

## 2016-10-28 DIAGNOSIS — R4589 Other symptoms and signs involving emotional state: Secondary | ICD-10-CM | POA: Diagnosis not present

## 2016-10-28 DIAGNOSIS — F17210 Nicotine dependence, cigarettes, uncomplicated: Secondary | ICD-10-CM | POA: Insufficient documentation

## 2016-10-28 NOTE — ED Provider Notes (Signed)
[facility] Emergency Department Provider Note   ____________________________________________   I have reviewed the triage vital signs and the nursing notes.   HISTORY  Chief Complaint Medication Refill   History limited by: Not Limited, Some history obtained through chart review   HPI Michael Schmitt is a 46 y.o. male who presents to the emergency department today stating that he needs medication refill. The patient states that he was recently seen at [facility]. He states that when he was discharged daily given a two-week prescription however per [facility] chart it appears that they were written for one month. It is unclear why the prescription change would have happened. The patient did try contacting his primary care doctor last week about medication refill. He was told to contact psychiatry, and states that he got himself an appointment next week. Unclear why the psychiatrist would not refill the medications. Patient denies any recent illness, nausea or vomiting. States he is feeling bad.    Past Medical History:  Diagnosis Date  . Anxiety   . Anxiety   . Arthritis    cerv. stenosis, spondylosis, HNP- lower back , has been followed in pain clinic, has  had injection s in cerv. area  . Blood dyscrasia    told that when he was young he was a" free bleeder"  . CAD (coronary artery disease)   . Cervical spondylosis without myelopathy 07/24/2014  . Cervicogenic headache 07/24/2014  . Chronic kidney disease    renal calculi- passed spontaneously  . Depression   . Diabetes mellitus without complication ([place])   . Fatty liver   . GERD (gastroesophageal reflux disease)   . Headache(784.0)   . Hyperlipidemia   . Hypertension   . Mental disorder   . MI, old   . RLS (restless legs syndrome)    detected on sleep study  . Shortness of breath   . Ventricular fibrillation ([place]) 04/05/2016    Patient Active Problem List   Diagnosis Date Noted  . Chronic  Suicidal ideation 09/15/2016  . Coronary artery disease 09/12/2016  . MDD (major depressive disorder), severe ([place]) 09/12/2016  . HTN (hypertension) 06/26/2016  . GERD (gastroesophageal reflux disease) 06/26/2016  . Alcohol use disorder, severe, 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[place]) 06/26/2016  . Sedative, hypnotic or anxiolytic use disorder, severe, dependence ([place]) 05/12/2016  . Ventricular fibrillation ([place]) 04/05/2016  . Combined hyperlipidemia 01/05/2016  . Cervical spondylosis without myelopathy 07/24/2014  . Tobacco use disorder 10/20/2010  . Asthma 10/20/2010    Past Surgical History:  Procedure Laterality Date  . ANTERIOR CERVICAL DECOMP/DISCECTOMY FUSION  11/13/2011   Procedure: ANTERIOR CERVICAL DECOMPRESSION/DISCECTOMY FUSION 1 LEVEL;  Surgeon: [name], MD;  Location: [place] [facility]UR[facility];  Service: Neurosurgery;  Laterality: N/A;  Anterior Cervical Decompression/discectomy Fusion. Cervical three-four.  Marland Kitchen CARDIAC CATHETERIZATION N/A 01/01/2015   Procedure: Left Heart Cath and Coronary Angiography;  Surgeon: [name], MD;  Location: [place] CV LAB;  Service: Cardiovascular;  Laterality: N/A;  . CARDIAC CATHETERIZATION N/A 04/05/2016   Procedure: Left Heart Cath and Coronary Angiography;  Surgeon: Belva Crome, MD;  Location: [place] CV LAB;  Service: Cardiovascular;  Laterality: N/A;  . CARDIAC CATHETERIZATION N/A 04/05/2016   Procedure: Coronary Stent Intervention;  Surgeon: Belva Crome, MD;  Location: [place] CV LAB;  Service: Cardiovascular;  Laterality: N/A;  . CARDIAC CATHETERIZATION N/A 04/05/2016   Procedure: Intravascular Ultrasound/IVUS;  Surgeon: Belva Crome, MD;  Location: [place] CV LAB;  Service: Cardiovascular;  Laterality: N/A;  . NASAL SINUS SURGERY     2005    Prior to Admission medications   Medication Sig Start Date End Date Taking? Authorizing Provider  asenapine (SAPHRIS) 5 MG SUBL 24 hr tablet Place 2 tablets (10 mg total) under the tongue 2 (two)  times daily. 09/14/16   Hildred Priest, MD  aspirin EC 81 MG EC tablet Take 1 tablet (81 mg total) by mouth daily. 09/15/16   Hildred Priest, MD  atorvastatin (LIPITOR) 80 MG tablet Take 1 tablet (80 mg total) by mouth daily at 6 PM. 09/14/16   Hildred Priest, MD  carvedilol (COREG) 6.25 MG tablet Take 1 tablet (6.25 mg total) by mouth 2 (two) times daily with a meal. 09/14/16   Hildred Priest, MD  chlorproMAZINE (THORAZINE) 25 MG tablet Take 25 mg by mouth 3 (three) times daily.    [provider]  clopidogrel (PLAVIX) 75 MG tablet Take 1 tablet (75 mg total) by mouth daily. 09/15/16   Hildred Priest, MD  gabapentin (NEURONTIN) 400 MG capsule Take 2 capsules (800 mg total) by mouth 4 (four) times daily. 09/14/16   Hildred Priest, MD  lisinopril (PRINIVIL,ZESTRIL) 10 MG tablet Take 1 tablet (10 mg total) by mouth daily. 09/15/16   Hildred Priest, MD  metFORMIN (GLUCOPHAGE) 500 MG tablet Take 1 tablet (500 mg total) by mouth 2 (two) times daily with a meal. 09/14/16   Hildred Priest, MD  mirtazapine (REMERON) 45 MG tablet Take 1 tablet (45 mg total) by mouth at bedtime. 09/14/16   Hildred Priest, MD  nitroGLYCERIN (NITROSTAT) 0.4 MG SL tablet Place 1 tablet (0.4 mg total) under the tongue every 5 (five) minutes x 3 doses as needed. 09/14/16 09/14/17  Hildred Priest, MD  traZODone (DESYREL) 150 MG tablet Take 1 tablet (150 mg total) by mouth at bedtime. 09/14/16   Hildred Priest, MD    Allergies Prednisone; Hydrocodone; and Varenicline  Family History  Problem Relation Age of Onset  . Prostate cancer Father   . Hypertension Mother   . Kidney Stones Mother   . Anxiety disorder Mother   . Depression Mother   . COPD Sister   . Hypertension Sister   . Diabetes Sister   . Depression Sister   . Anxiety disorder Sister   . Seizures Sister   . ADD / ADHD Son   . ADD /  ADHD Daughter     Social History Social History  Substance Use Topics  . Smoking status: Current Every Day Smoker    Packs/day: 2.00    Years: 17.00    Types: Cigarettes, Cigars  . Smokeless tobacco: Former Systems developer     Comment: occassional snuff   . Alcohol use No     Comment: last drinked in 3 months.     Review of Systems Constitutional: No fever/chills Eyes: No visual changes. ENT: No sore throat. Cardiovascular: Denies chest pain. Respiratory: Denies shortness of breath. Gastrointestinal: No abdominal pain.  No nausea, no vomiting.  No diarrhea.   Genitourinary: Negative for dysuria. Musculoskeletal: Negative for back pain. Skin: Negative for rash. Neurological: Negative for headaches, focal weakness or numbness.  ____________________________________________   PHYSICAL EXAM:  VITAL SIGNS: ED Triage Vitals  Enc Vitals Group     BP 10/28/16 1515 (!) 143/96     Pulse Rate 10/28/16 1515 95     Resp 10/28/16 1515 16     Temp 10/28/16 1515 98.4 F (36.9 C)     Temp Source 10/28/16 1515 Oral  SpO2 10/28/16 1515 98 %     Weight --      Height --      Head Circumference --      Peak Flow --      Pain Score 10/28/16 1514 6    Constitutional: Alert and oriented. Well appearing and in no distress. Eyes: Conjunctivae are normal.  ENT   Head: Normocephalic and atraumatic.   Nose: No congestion/rhinnorhea.   Mouth/Throat: Mucous membranes are moist.   Neck: No stridor. Hematological/Lymphatic/Immunilogical: No cervical lymphadenopathy. Cardiovascular: Normal rate, regular rhythm.  No murmurs, rubs, or gallops.  Respiratory: Normal respiratory effort without tachypnea nor retractions. Breath sounds are clear and equal bilaterally. No wheezes/rales/rhonchi. Gastrointestinal: Soft and non tender. No rebound. No guarding.  Genitourinary: Deferred Musculoskeletal: Normal range of motion in all extremities. No lower extremity edema. Neurologic:  Normal speech  and language. No gross focal neurologic deficits are appreciated.  Skin:  Skin is warm, dry and intact. No rash noted. Psychiatric: States he is starting to feel depressed.  ____________________________________________    LABS (pertinent positives/negatives)  None  ____________________________________________   EKG  None  ____________________________________________    RADIOLOGY  None  ____________________________________________   PROCEDURES  Procedures  ____________________________________________   INITIAL IMPRESSION / ASSESSMENT AND PLAN / ED COURSE  Pertinent labs & imaging results that were available during my care of the patient were reviewed by me and considered in my medical decision making (see chart for details).  Patient presented to the emergency department today requesting medication refill. This slightly unclear what is happening with his medications since discharge from [facility]. Her [facility] notes he was given a prescription that would be good for a month. Per telephone encounters through his primary care doctor's office note saying that his medications were sent to the pharmacy on the 27 th of last month. Slightly unclear what is happening to these medications. I did discuss with patient importance of following up with his psychiatrist. Given the fact that he is on multiple psychiatric medication and it is of unclear. I do not feel comfortable refilling these medications at this time. Patient is not clinically going through any significant withdrawal. Discussed with patient importance of following up.  ____________________________________________   FINAL CLINICAL IMPRESSION(S) / ED DIAGNOSES  Final diagnoses:  Encounter for medication refill     Note: This dictation was prepared with Dragon dictation. Any transcriptional errors that result from this process are unintentional     Nance Pear, MD 10/28/16 [phone]

## 2016-10-28 NOTE — ED Triage Notes (Signed)
Pt states that he was in [facility] for 25 days for SI attempt. Pt states that he was started on Effexor 300 mg, Depakote 500 mg QAM and 1000 mg QHS, and Risperadol 2 mg QAM and 4 mg QPM. Pt states that he followed up with [facility] and went to get his medications refilled but the pharmacy said they did not have any meds for him. Pt states that he has not had his medication since Thursday. Pt keeps saying that "I'm going to snap somebody in half." Pt appears agittated in triage

## 2016-10-28 NOTE — ED Notes (Signed)
Seen by MD. Informs MD he is leaving without his instructions.

## 2016-10-29 DIAGNOSIS — Z7902 Long term (current) use of antithrombotics/antiplatelets: Secondary | ICD-10-CM | POA: Diagnosis not present

## 2016-10-29 DIAGNOSIS — Z760 Encounter for issue of repeat prescription: Secondary | ICD-10-CM | POA: Diagnosis not present

## 2016-10-29 DIAGNOSIS — Z7984 Long term (current) use of oral hypoglycemic drugs: Secondary | ICD-10-CM | POA: Diagnosis not present

## 2016-10-29 DIAGNOSIS — Z7982 Long term (current) use of aspirin: Secondary | ICD-10-CM | POA: Diagnosis not present

## 2016-10-29 DIAGNOSIS — Z992 Dependence on renal dialysis: Secondary | ICD-10-CM | POA: Diagnosis not present

## 2016-10-29 DIAGNOSIS — R451 Restlessness and agitation: Secondary | ICD-10-CM | POA: Diagnosis not present

## 2016-10-29 DIAGNOSIS — I129 Hypertensive chronic kidney disease with stage 1 through stage 4 chronic kidney disease, or unspecified chronic kidney disease: Secondary | ICD-10-CM | POA: Diagnosis not present

## 2016-10-29 DIAGNOSIS — Z79899 Other long term (current) drug therapy: Secondary | ICD-10-CM | POA: Diagnosis not present

## 2016-10-29 DIAGNOSIS — K219 Gastro-esophageal reflux disease without esophagitis: Secondary | ICD-10-CM | POA: Diagnosis not present

## 2016-10-29 DIAGNOSIS — E1122 Type 2 diabetes mellitus with diabetic chronic kidney disease: Secondary | ICD-10-CM | POA: Diagnosis not present

## 2016-10-29 DIAGNOSIS — Z885 Allergy status to narcotic agent status: Secondary | ICD-10-CM | POA: Diagnosis not present

## 2016-10-29 DIAGNOSIS — Z888 Allergy status to other drugs, medicaments and biological substances status: Secondary | ICD-10-CM | POA: Diagnosis not present

## 2016-10-29 DIAGNOSIS — R9431 Abnormal electrocardiogram [ECG] [EKG]: Secondary | ICD-10-CM | POA: Diagnosis not present

## 2016-10-29 DIAGNOSIS — I252 Old myocardial infarction: Secondary | ICD-10-CM | POA: Diagnosis not present

## 2016-10-29 DIAGNOSIS — N189 Chronic kidney disease, unspecified: Secondary | ICD-10-CM | POA: Diagnosis not present

## 2016-10-29 DIAGNOSIS — I2510 Atherosclerotic heart disease of native coronary artery without angina pectoris: Secondary | ICD-10-CM | POA: Diagnosis not present

## 2016-11-14 DIAGNOSIS — E1122 Type 2 diabetes mellitus with diabetic chronic kidney disease: Secondary | ICD-10-CM | POA: Diagnosis not present

## 2016-11-14 DIAGNOSIS — Z7902 Long term (current) use of antithrombotics/antiplatelets: Secondary | ICD-10-CM | POA: Diagnosis not present

## 2016-11-14 DIAGNOSIS — I252 Old myocardial infarction: Secondary | ICD-10-CM | POA: Diagnosis not present

## 2016-11-14 DIAGNOSIS — Z7984 Long term (current) use of oral hypoglycemic drugs: Secondary | ICD-10-CM | POA: Diagnosis not present

## 2016-11-14 DIAGNOSIS — I129 Hypertensive chronic kidney disease with stage 1 through stage 4 chronic kidney disease, or unspecified chronic kidney disease: Secondary | ICD-10-CM | POA: Diagnosis not present

## 2016-11-14 DIAGNOSIS — I2510 Atherosclerotic heart disease of native coronary artery without angina pectoris: Secondary | ICD-10-CM | POA: Diagnosis not present

## 2016-11-14 DIAGNOSIS — Z79899 Other long term (current) drug therapy: Secondary | ICD-10-CM | POA: Diagnosis not present

## 2016-11-14 DIAGNOSIS — I1310 Hypertensive heart and chronic kidney disease without heart failure, with stage 1 through stage 4 chronic kidney disease, or unspecified chronic kidney disease: Secondary | ICD-10-CM | POA: Diagnosis not present

## 2016-11-14 DIAGNOSIS — Z7982 Long term (current) use of aspirin: Secondary | ICD-10-CM | POA: Diagnosis not present

## 2016-11-14 DIAGNOSIS — Z9109 Other allergy status, other than to drugs and biological substances: Secondary | ICD-10-CM | POA: Diagnosis not present

## 2016-11-21 DIAGNOSIS — Z79899 Other long term (current) drug therapy: Secondary | ICD-10-CM | POA: Insufficient documentation

## 2016-11-21 DIAGNOSIS — G894 Chronic pain syndrome: Secondary | ICD-10-CM | POA: Insufficient documentation

## 2016-11-21 DIAGNOSIS — F1190 Opioid use, unspecified, uncomplicated: Secondary | ICD-10-CM | POA: Insufficient documentation

## 2016-11-21 HISTORY — DX: Other long term (current) drug therapy: Z79.899

## 2016-11-21 NOTE — Progress Notes (Signed)
Patient's Name: Michael Schmitt  MRN: [id number]  Referring Provider: Hortencia Pilar, MD  DOB: 25-Feb-1971  PCP: [facility]  DOS: 11/22/2016  Note by: Gaspar Cola, MD  Service setting: Ambulatory outpatient  Specialty: Interventional Pain Management  Location: [facility] (AMB) Pain Management Facility  Visit type: Initial Patient Evaluation  Patient type: New Patient   Primary Reason(s) for Visit: Encounter for initial evaluation of one or more chronic problems (new to examiner) potentially causing chronic pain, and posing a threat to normal musculoskeletal function. (Level of risk: High) CC: Neck Pain (left)  HPI  Michael Schmitt is a 46 y.o. year old, male patient, who comes today to see [place] for the first time for an initial evaluation of his chronic pain. He has Cervical spondylosis without myelopathy; Tobacco use disorder; Ventricular fibrillation ([place]); Sedative, hypnotic or anxiolytic use disorder, severe, dependence ([place]); HTN (hypertension); GERD (gastroesophageal reflux disease); Alcohol use disorder, severe, 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[place]); Asthma; Combined hyperlipidemia; Coronary artery disease; MDD (major depressive disorder), severe ([place]); Chronic Suicidal ideation; Moderate benzodiazepine use disorder ([place]); Severe episode of recurrent major depressive disorder, without psychotic features ([place]); Uncontrolled type 2 diabetes mellitus without complication, without long-term current use of insulin ([place]); Chronic pain syndrome; Long term prescription benzodiazepine use; Opiate use; Chronic neck pain (Primary Area of Pain) (Left); Cervical facet syndrome (Left); Cervical radiculitis (Left); History of cervical spinal surgery; Cervical spondylosis; Chronic shoulder pain (Secondary Area of Pain) (Left); Arthropathy of shoulder (Left); Chronic low back pain (Tertiary Area of Pain) (Bilateral) (L>R); and Lumbar facet syndrome (Bilateral) (L>R) on his problem list. Today he comes in for evaluation  of his Neck Pain (left)  Pain Assessment: Location: Left Neck Radiating: left shoulder and left upper arm, with weakness in left hand Onset: More than a month ago Duration: Chronic pain Quality: Aching, Numbness, Tingling Severity: 8 /10 (self-reported pain score)  Note: Reported level is inconsistent with clinical observations. Clinically the patient looks like a 2/10 Information on the proper use of the pain scale provided to the patient today Effect on ADL: affects mentally, unable to lift left arm Timing: Constant Modifying factors: nothing  Onset and Duration: Gradual and Date of onset: 2008 Cause of pain: Unknown Severity: Getting worse, NAS-11 at its worse: 10/10, NAS-11 at its best: 6/10, NAS-11 now: 8/10 and NAS-11 on the average: 8/10 Timing: Not influenced by the time of the day and During activity or exercise Aggravating Factors: Bending, Climbing, Kneeling, Lifiting, Motion, Nerve blocks, Prolonged sitting, Prolonged standing, Squatting, Stooping , Surgery made it worse, Twisting, Walking, Walking uphill, Walking downhill and Working Alleviating Factors: nothing Associated Problems: Day-time cramps, Night-time cramps, Depression, Dizziness, Nausea, Numbness, Personality changes, Sadness, Spasms, Suicidal ideations, Sweating, Swelling, Temperature changes, Tingling, Weakness, Pain that wakes patient up and Pain that does not allow patient to sleep Quality of Pain: Aching, Agonizing, Annoying, Cramping, Cruel, Deep, Disabling, Distressing, Dreadful, Dull, Exhausting, Fearful, Feeling of weight, Getting longer, Getting shorter, Horrible, Hot, Pressure-like, Sharp, Shooting, Sickening, Tender, Throbbing, Tingling, Tiring, Toothache-like and Uncomfortable Previous Examinations or Tests: Ct-Myelogram, MRI scan, X-rays and Nerve conduction test Previous Treatments: The patient denies any previous treatments  The patient comes into the clinics today for the first time for a chronic pain  management evaluation. According to the patient the primary area of pain is that of the neck. The pain is primarily on the left side of the neck. The patient indicates having had one cervical fusion around 2013 by Dr. Saintclair Halsted. Initially his  symptoms included neck pain and upper extremity pain and they did not improve after the surgery. He did have physical therapy approximately 2-3 years ago for a period of 2-3 months. He indicates having had this physical therapy at [facility]. He also indicates having had some nerve blocks done by Dr. Primus Bravo. According to the patient he had 2-3 blocks and they did not help. He denies any recent x-rays, MRIs, or CT scans. He does admit to having had some nerve conduction tests done at the [facility]. In addition he also bone tear that he has been seen at the [facility].  The patient's second area of pain is that of the left shoulder. He denies any prior surgeries, nerve blocks, joint injections, physical therapy, or x-rays. His third and final area of pain is that of the lower back with the pain being bilateral but worse on the left side when compared to the right. He denies any prior surgeries, nerve blocks, joint injections, physical therapy, or x-rays.  The patient indicates having managed his pain using Goody powders, BCs, hydrocodone, and clonazepam.  Today I took the time to provide the patient with information regarding my pain practice. The patient was informed that my practice is divided into two sections: an interventional pain management section, as well as a completely separate and distinct medication management section. I explained that I have procedure days for my interventional therapies, and evaluation days for follow-ups and medication management. Because of the amount of documentation required during both, they are kept separated. This means that there is the possibility that he may be scheduled for a procedure on one  day, and medication management the next. I have also informed him that because of staffing and facility limitations, I no longer take patients for medication management only. To illustrate the reasons for this, I gave the patient the example of surgeons, and how inappropriate it would be to refer a patient to his/her care, just to write for the post-surgical antibiotics on a surgery done by a different surgeon.   Because interventional pain management is my board-certified specialty, the patient was informed that joining my practice means that they are open to any and all interventional therapies. I made it clear that this does not mean that they will be forced to have any procedures done. What this means is that I believe interventional therapies to be essential part of the diagnosis and proper management of chronic pain conditions. Therefore, patients not interested in these interventional alternatives will be better served under the care of a different practitioner.  The patient was also made aware of my Comprehensive Pain Management Safety Guidelines where by joining my practice, they limit all of their nerve blocks and joint injections to those done by our practice, for as long as we are retained to manage their care.   Historic Controlled Substance Pharmacotherapy Review  PMP and historical list of controlled substances: Ambien 10 mg; chlordiazepoxide 25 mg; clonazepam 2 mg; lorazepam 2 mg; Belsomra 15 mg; alprazolam 2 mg; oxycodone/APAP 10/325; tramadol 50 mg; oxycodone/APAP 5/325; Lyrica 75 mg; oxycodone IR 10 mg; morphine ER 15 mg; hydrocodone/APAP 7.5/325; oxycodone IR 5 mg Highest opioid analgesic regimen found: Oxycodone/APAP 5/325 one tablet by mouth 11 times per day (55 mg/day of oxycodone) (82.5 MME/day) Most recent opioid analgesic: Oxycodone/APAP 10/325 one tablet by mouth every 6 hours (40 mg/day of oxycodone) (60 MME/day) (last filled on 05/20/2015)  Current opioid analgesics: None  Highest recorded MME/day: 82.5 mg/day MME/day: 0 mg/day Medications: The patient did not bring the medication(s) to the appointment, as requested in our "New Patient Package" Pharmacodynamics: Desired effects: Analgesia: The patient reports >50% benefit. Reported improvement in function: The patient reports medication allows him to accomplish basic ADLs. Clinically meaningful improvement in function (CMIF): Sustained CMIF goals met Perceived effectiveness: Described as relatively effective, allowing for increase in activities of daily living (ADL) Undesirable effects: Side-effects or Adverse reactions: None reported Historical Monitoring: The patient  reports that he does not use drugs. List of all UDS Test(s): Lab Results  Component Value Date   MDMA NONE DETECTED 09/16/2016   MDMA NONE DETECTED 09/11/2016   MDMA NONE DETECTED 07/04/2016   MDMA NONE DETECTED 06/23/2016   MDMA NONE DETECTED 06/10/2016   MDMA NONE DETECTED 06/03/2016   MDMA NONE DETECTED 05/24/2016   MDMA NONE DETECTED 05/14/2016   MDMA NONE DETECTED 05/12/2016   MDMA NONE DETECTED 05/06/2016   MDMA NONE DETECTED 03/26/2016   MDMA NONE DETECTED 09/23/2015   MDMA NONE DETECTED 09/05/2015   MDMA NONE DETECTED 07/31/2015   MDMA NEGATIVE 08/14/2012   MDMA NEGATIVE 03/25/2012   COCAINSCRNUR NONE DETECTED 09/16/2016   COCAINSCRNUR NONE DETECTED 09/11/2016   COCAINSCRNUR NONE DETECTED 07/04/2016   COCAINSCRNUR NONE DETECTED 06/23/2016   COCAINSCRNUR NONE DETECTED 06/10/2016   COCAINSCRNUR NONE DETECTED 06/03/2016   COCAINSCRNUR NONE DETECTED 05/24/2016   COCAINSCRNUR NONE DETECTED 05/14/2016   COCAINSCRNUR NONE DETECTED 05/12/2016   COCAINSCRNUR NONE DETECTED 05/06/2016   COCAINSCRNUR NONE DETECTED 03/26/2016   COCAINSCRNUR NONE DETECTED 09/23/2015   COCAINSCRNUR NONE DETECTED 09/05/2015   COCAINSCRNUR NONE DETECTED 07/31/2015   COCAINSCRNUR NEGATIVE 08/14/2012   COCAINSCRNUR NEGATIVE 03/25/2012    COCAINSCRNUR NONE DETECTED 03/22/2008   COCAINSCRNUR NONE DETECTED 08/15/2007   PCPSCRNUR NONE DETECTED 09/16/2016   PCPSCRNUR NONE DETECTED 09/11/2016   PCPSCRNUR NONE DETECTED 07/04/2016   PCPSCRNUR NONE DETECTED 06/23/2016   PCPSCRNUR NONE DETECTED 06/10/2016   PCPSCRNUR NONE DETECTED 06/03/2016   PCPSCRNUR POSITIVE (A) 05/24/2016   PCPSCRNUR NONE DETECTED 05/14/2016   PCPSCRNUR NONE DETECTED 05/12/2016   PCPSCRNUR NONE DETECTED 05/06/2016   PCPSCRNUR NONE DETECTED 03/26/2016   PCPSCRNUR NONE DETECTED 09/23/2015   PCPSCRNUR NONE DETECTED 09/05/2015   PCPSCRNUR NONE DETECTED 07/31/2015   PCPSCRNUR NEGATIVE 08/14/2012   PCPSCRNUR NEGATIVE 03/25/2012   THCU NONE DETECTED 09/16/2016   THCU NONE DETECTED 09/11/2016   THCU NONE DETECTED 07/04/2016   THCU NONE DETECTED 06/23/2016   THCU POSITIVE (A) 06/10/2016   THCU NONE DETECTED 06/03/2016   THCU NONE DETECTED 05/24/2016   THCU NONE DETECTED 05/14/2016   THCU NONE DETECTED 05/12/2016   THCU RESULTS UNAVAILABLE DUE TO INTERFERING SUBSTANCE (A) 05/06/2016   THCU NONE DETECTED 03/26/2016   THCU NONE DETECTED 09/23/2015   THCU NONE DETECTED 09/05/2015   THCU NONE DETECTED 07/31/2015   THCU NEGATIVE 08/14/2012   THCU NEGATIVE 03/25/2012   THCU NONE DETECTED 03/22/2008   THCU NONE DETECTED 08/15/2007   ETH <5 09/16/2016   ETH <5 09/11/2016   ETH <5 08/03/2016   ETH <5 07/04/2016   ETH <5 06/24/2016   ETH <5 06/23/2016   ETH <5 06/10/2016   ETH <5 06/03/2016   ETH <5 05/24/2016   ETH <5 05/14/2016   ETH <5 05/12/2016   ETH <5 05/06/2016   ETH <5 03/26/2016   ETH <5 09/23/2015   ETH <5 09/05/2015   ETH <5 07/31/2015   ETH  03/22/2008    <5  LOWEST DETECTABLE LIMIT FOR SERUM ALCOHOL IS 5 mg/dL FOR MEDICAL PURPOSES ONLY   [facility]  08/15/2007    <5        LOWEST DETECTABLE LIMIT FOR SERUM ALCOHOL IS 11 mg/dL FOR MEDICAL PURPOSES ONLY   List of all Serum Drug Screening Test(s):  No results found for: AMPHSCRSER,  BARBSCRSER, BENZOSCRSER, COCAINSCRSER, PCPSCRSER, PCPQUANT, THCSCRSER, CANNABQUANT, OPIATESCRSER, OXYSCRSER, PROPOXSCRSER Historical Background Evaluation: [place] PDMP: Six (6) year initial data search conducted.             [place] Department of public safety, offender search: Editor, commissioning Information) Non-contributory Risk Assessment Profile: Aberrant behavior: None observed or detected today Risk factors for fatal opioid overdose: None identified today Fatal overdose hazard ratio (HR): Calculation deferred Non-fatal overdose hazard ratio (HR): Calculation deferred Risk of opioid abuse or dependence: 0.7-3.0% with doses ? 36 MME/day and 6.1-26% with doses ? 120 MME/day. Substance use disorder (SUD) risk level: Pending results of Medical Psychology Evaluation for SUD Opioid risk tool (ORT) (Total Score): 6     Opioid Risk Tool - 11/22/16 1052      Family History of Substance Abuse   Alcohol Negative   Illegal Drugs Negative   Rx Drugs Negative     Personal History of Substance Abuse   Alcohol Positive Male or Male   Illegal Drugs Negative   Rx Drugs Negative     Age   Age between 39-45 years  No     History of Preadolescent Sexual Abuse   History of Preadolescent Sexual Abuse Negative or Male     Psychological Disease   Psychological Disease Positive   ADD Negative   OCD Negative   Bipolar Positive   Schizophrenia Negative   Depression Positive     Total Score   Opioid Risk Tool Scoring 6   Opioid Risk Interpretation Moderate Risk     ORT Scoring interpretation table:  Score <3 = Low Risk for SUD  Score between 4-7 = Moderate Risk for SUD  Score >8 = High Risk for Opioid Abuse   PHQ-2 Depression Scale:  Total score: 0  PHQ-2 Scoring interpretation table: (Score and probability of major depressive disorder)  Score 0 = No depression  Score 1 = 15.4% Probability  Score 2 = 21.1% Probability  Score 3 = 38.4% Probability  Score 4 = 45.5% Probability  Score 5 = 56.4%  Probability  Score 6 = 78.6% Probability   PHQ-9 Depression Scale:  Total score: 0  PHQ-9 Scoring interpretation table:  Score 0-4 = No depression  Score 5-9 = Mild depression  Score 10-14 = Moderate depression  Score 15-19 = Moderately severe depression  Score 20-27 = Severe depression (2.4 times higher risk of SUD and 2.89 times higher risk of overuse)   Pharmacologic Plan: Pending ordered tests and/or consults            Initial impression: Pending review of available data and ordered tests.  Meds   Current Outpatient Prescriptions:  .  aspirin EC 81 MG EC tablet, Take 1 tablet (81 mg total) by mouth daily., Disp: 30 tablet, Rfl: 0 .  atorvastatin (LIPITOR) 80 MG tablet, Take 1 tablet (80 mg total) by mouth daily at 6 PM., Disp: 30 tablet, Rfl: 0 .  carvedilol (COREG) 6.25 MG tablet, Take 1 tablet (6.25 mg total) by mouth 2 (two) times daily with a meal., Disp: 60 tablet, Rfl: 0 .  clopidogrel (PLAVIX) 75 MG tablet, Take 1 tablet (75 mg total) by mouth  daily., Disp: 30 tablet, Rfl: 0 .  divalproex (DEPAKOTE) 500 MG DR tablet, Take 500 mg by mouth 2 (two) times daily., Disp: , Rfl:  .  gabapentin (NEURONTIN) 400 MG capsule, Take 2 capsules (800 mg total) by mouth 4 (four) times daily., Disp: 240 capsule, Rfl: 0 .  hydrOXYzine (ATARAX/VISTARIL) 50 MG tablet, Take 50 mg by mouth 3 (three) times daily as needed., Disp: , Rfl:  .  metFORMIN (GLUCOPHAGE) 500 MG tablet, Take 1 tablet (500 mg total) by mouth 2 (two) times daily with a meal., Disp: 60 tablet, Rfl: 0 .  mirtazapine (REMERON) 45 MG tablet, Take 1 tablet (45 mg total) by mouth at bedtime., Disp: 30 tablet, Rfl: 0 .  nitroGLYCERIN (NITROSTAT) 0.4 MG SL tablet, Place 1 tablet (0.4 mg total) under the tongue every 5 (five) minutes x 3 doses as needed., Disp: 10 tablet, Rfl: 0 .  prazosin (MINIPRESS) 2 MG capsule, Take 2 mg by mouth at bedtime., Disp: , Rfl:  .  risperiDONE (RISPERDAL) 2 MG tablet, Take 2 mg by mouth at bedtime.,  Disp: , Rfl:  .  venlafaxine XR (EFFEXOR-XR) 150 MG 24 hr capsule, Take 150 mg by mouth daily with breakfast., Disp: , Rfl:   Imaging Review  Cervical Imaging: Cervical MR wo contrast:  Results for orders placed in visit on 09/29/11  MR C Spine Ltd W/O Cm   Narrative * PRIOR REPORT IMPORTED FROM AN EXTERNAL SYSTEM *   PRIOR REPORT IMPORTED FROM THE SYNGO [place] EXAM:    pain weakness bulging disc  COMMENTS:   PROCEDURE:     MR  - MR CERVICAL SPINE WO CONT  - Sep 29 2011  9:05AM   RESULT:   Technique: Multiplanar and multisequence imaging was obtained without the  administration of gadolinium.   Findings: Cervical cord demonstrates no T1 or T2-signal abnormalities.  Craniocervical junction is intact.   At the C2-C3 level, there is no evidence of sac stenosis or neuroforaminal  narrowing.   At the C3-C4 level, a broad-based disc bulge is appreciated causing  partial  effacement of the to CSF space. There is mild to moderate neuroforaminal  narrowing on the left without evidence of exiting nerve root compromise or  compression.   At the C4-C5 level, there is no evidence of thecal sac stenosis or  neuroforaminal narrowing.   At the C5-C6 level, there is no evidence of thecal sac stenosis or neural  foramina narrowing.   At the C6-C7 and C7-T1 levels, there is no evidence of thecal sac stenosis  or neural foraminal narrowing.   There is no evidence of marrow edema or linear signal void reflecting an  acute fracture.   IMPRESSION:   Broad-based disc bulge with lateralization to the left at C3-C4 with  asymmetric mild thecal sac stenosis and mild to moderate neuroforaminal  narrowing of the left without evidence of exiting nerve root compression  or  compromise.   Thank you for the opportunity to contribute to the care of your patient.       Cervical DG 1 view:  Results for orders placed during the hospital encounter of 11/13/11  DG Cervical  Spine 1 View   Narrative *RADIOLOGY REPORT*  Clinical Data: Neck pain  DG C-ARM 1-60 MIN,DG CERVICAL SPINE - 1 VIEW  Comparison: None.  Findings: C-arm films document C3-4 ACDF.  No adverse features.  IMPRESSION: As above.   Original Report Authenticated By: Staci Righter,  M.D.    Cervical DG complete:  Results for orders placed during the hospital encounter of 08/18/14  DG Cervical Spine Complete   Narrative CLINICAL DATA:  Chronic neck pain since cervical fusion in 2013.  EXAM: CERVICAL SPINE  4+ VIEWS  COMPARISON:  MRI cervical spine 09/29/2011  FINDINGS: Anterior and interbody fusion changes noted at C3-4. No complicating features are demonstrated. Mild degenerative spurring changes noted at C2-3. Normal alignment of the cervical vertebral bodies. No acute bony findings. The nerve foramen are grossly patent. The left oblique films are slightly over rotated. The C1-2 articulations are maintained. The lung apices are not visualized.  IMPRESSION: Solid fusion changes at C3-4.  No complicating features.  Normal alignment and no acute bony findings or significant degenerative changes.   Electronically Signed   By: Marijo Sanes M.D.   On: 08/18/2014 15:48    Lumbosacral Imaging: Lumbar MR wo contrast:  Results for orders placed in visit on 06/16/11  MR L Spine Ltd W/O Cm   Narrative * PRIOR REPORT IMPORTED FROM AN EXTERNAL SYSTEM *   PRIOR REPORT IMPORTED FROM THE SYNGO [place] EXAM:    low back pain  COMMENTS:   PROCEDURE:     MMR - MMR LUMBAR SPINE WO CONTRAST  - Jun 16 2011  2:24PM   RESULT:   Technique: Multiplanar and multisequence imaging of the lumbar spine was  obtained without the administration of gadolinium.   Findings: The conus medullaris terminates at an L1 level. The cauda equina  demonstrate no evidence of clumping or thickening.   At the T12-L1, L1-L2, L2-L3 levels, there is no evidence of thecal sac  stenosis  or neuroforaminal narrowing.   At the L3-L4 level, a mild broad-based disc bulge is appreciated causing  minimal effacement of the anterior CSF space. There is no evidence of  significant thecal sac stenosis or neuroforaminal narrowing.   At the L4-L5 level, there is no evidence of thecal sac stenosis or  neuroforaminal narrowing.   At the L5-L6 level, a mild focal disc protrusion is appreciated causing  partial effacement of the anterior CSF space. There is a component of  lateralization to the right and left. Mild bilateral neuroforaminal  narrowing is appreciated without evidence of exiting nerve root  compression.   A Schmorl's node is appreciated at the superior endplate region of L5.  There  is no evidence of marrow edema or linear signal void reflecting an acute  fracture.   IMPRESSION:   Mild multilevel degenerative disease changes without evidence of thecal  sac  stenosis no significant neuroforaminal narrowing.   Thank you for the opportunity to contribute to the care of your patient.       Complexity Note: Imaging results reviewed. Results discussed using Layman's terms.               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 He did bump his head and  the wall. Did not pass out. Yelling and screaming, states he needs benzos. Has a history of benzo abuse. He denies overdose.  He has no other medical complaints. He wants to live a mental hospital and that is why he is here.  EXAM: CT HEAD WITHOUT CONTRAST  TECHNIQUE: Contiguous axial images were obtained from the base of the skull through the vertex without intravenous contrast.  COMPARISON:  04/17/2013  FINDINGS: Brain: No evidence of acute infarction, hemorrhage, hydrocephalus, extra-axial collection or mass lesion/mass effect.  Vascular: No hyperdense vessel or unexpected calcification.  Skull: Normal. Negative for fracture or focal lesion.  Sinuses/Orbits: Globes and orbits are unremarkable. There is mild-to-moderate mucosal thickening along the inferior frontal sinuses and anterior ethmoid sinuses. There changes previous sinus surgery. Remaining sinus cavities are clear. Clear mastoid air cells.  Other: None.  IMPRESSION: 1. No intracranial abnormalities.   Electronically Signed   By: Lajean Manes M.D.   On: 09/16/2016 15:48    Psychological-Psychiatric History: Anxiousness, Depressed, Prone to panicking, Suicidal ideations, Attempted suicide and Difficulty sleeping and or falling asleep Gastrointestinal History: Vomiting blood (Ulcers) and Reflux or heatburn Genitourinary History: Passing kidney stones Hematological History: Brusing easily and Bleeding easily Endocrine History: High blood sugar controlled without the use of insulin (NIDDM) Rheumatologic History: No reported rheumatological signs and symptoms such as fatigue, joint pain, tenderness, swelling, redness, heat, stiffness, decreased range of motion, with or without associated rash Musculoskeletal History: Negative for myasthenia gravis, muscular dystrophy, multiple sclerosis or malignant hyperthermia Work History: Disabled  Allergies  Mr. Tay is allergic to prednisone; hydrocodone; and varenicline.  Laboratory Chemistry  Inflammation Markers (CRP: Acute Phase) (ESR: Chronic Phase) No results found for: CRP,  ESRSEDRATE               Renal Function Markers Lab Results  Component Value Date   BUN 14 09/16/2016   CREATININE 0.98 09/16/2016   GFRAA >60 09/16/2016   GFRNONAA >60 09/16/2016                 Hepatic Function Markers Lab Results  Component Value Date   AST 35 09/16/2016   ALT 26 09/16/2016   ALBUMIN 4.5 09/16/2016   ALKPHOS 112 09/16/2016                 Electrolytes Lab Results  Component Value Date   NA 135 09/16/2016   K 4.1 09/16/2016   CL 103 09/16/2016   CALCIUM 9.2 09/16/2016                 Neuropathy Markers No results found for: [id number]               Bone Pathology Markers Lab Results  Component Value Date   ALKPHOS 112 09/16/2016   CALCIUM 9.2 09/16/2016                 Coagulation Parameters Lab Results  Component Value Date   INR 0.93 04/05/2016   LABPROT 12.5 04/05/2016   APTT 26 04/05/2016   PLT 221 09/16/2016                 Cardiovascular Markers Lab Results  Component Value Date   HGB 14.2 09/16/2016   HCT 41.5 09/16/2016                 Note: Lab results reviewed.  [place]  Drug: Mr. Coll  reports that he does not use drugs. Alcohol:  reports that he does not drink  alcohol. Tobacco:  reports that he has been smoking Cigarettes and Cigars.  He has a 34.00 pack-year smoking history. He has quit using smokeless tobacco. Medical:  has a past medical history of Anxiety; Anxiety; Arthritis; Blood dyscrasia; CAD (coronary artery disease); Cervical spondylosis without myelopathy (07/24/2014); Cervicogenic headache (07/24/2014); Chronic kidney disease; Depression; Diabetes mellitus without complication ([place]); Fatty liver; GERD (gastroesophageal reflux disease); Headache(784.0); Hyperlipidemia; Hypertension; Mental disorder; MI, old; RLS (restless legs syndrome); Shortness of breath; and Ventricular fibrillation ([place]) (04/05/2016). Family: family history includes ADD / ADHD in his daughter and son; Anxiety disorder in his mother and sister;  COPD in his sister; Depression in his mother and sister; Diabetes in his sister; Hypertension in his mother and sister; Kidney Stones in his mother; Prostate cancer in his father; Seizures in his sister.  Past Surgical History:  Procedure Laterality Date  . ANTERIOR CERVICAL DECOMP/DISCECTOMY FUSION  11/13/2011   Procedure: ANTERIOR CERVICAL DECOMPRESSION/DISCECTOMY FUSION 1 LEVEL;  Surgeon: [name], MD;  Location: [facility];  Service: Neurosurgery;  Laterality: N/A;  Anterior Cervical Decompression/discectomy Fusion. Cervical three-four.  Marland Kitchen CARDIAC CATHETERIZATION N/A 01/01/2015   Procedure: Left Heart Cath and Coronary Angiography;  Surgeon: [name], MD;  Location: [facility];  Service: Cardiovascular;  Laterality: N/A;  . CARDIAC CATHETERIZATION N/A 04/05/2016   Procedure: Left Heart Cath and Coronary Angiography;  Surgeon: Belva Crome, MD;  Location: [facility];  Service: Cardiovascular;  Laterality: N/A;  . CARDIAC CATHETERIZATION N/A 04/05/2016   Procedure: Coronary Stent Intervention;  Surgeon: Belva Crome, MD;  Location: [facility];  Service: Cardiovascular;  Laterality: N/A;  . CARDIAC CATHETERIZATION N/A 04/05/2016   Procedure: Intravascular Ultrasound/IVUS;  Surgeon: Belva Crome, MD;  Location: [facility];  Service: Cardiovascular;  Laterality: N/A;  . NASAL SINUS SURGERY     2005   Active Ambulatory Problems    Diagnosis Date Noted  . Cervical spondylosis without myelopathy 07/24/2014  . Tobacco use disorder 10/20/2010  . Ventricular fibrillation ([place]) 04/05/2016  . Sedative, hypnotic or anxiolytic use disorder, severe, dependence ([place]) 05/12/2016  . HTN (hypertension) 06/26/2016  . GERD (gastroesophageal reflux disease) 06/26/2016  . Alcohol use disorder, severe, 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[place]) 06/26/2016  . Asthma 10/20/2010  . Combined hyperlipidemia 01/05/2016  . Coronary artery disease 09/12/2016  . MDD (major depressive disorder),  severe ([place]) 09/12/2016  . Chronic Suicidal ideation 09/15/2016  . Moderate benzodiazepine use disorder ([place]) 06/11/2016  . Severe episode of recurrent major depressive disorder, without psychotic features ([place]) 04/15/2013  . Uncontrolled type 2 diabetes mellitus without complication, without long-term current use of insulin ([place]) 10/19/2016  . Chronic pain syndrome 11/21/2016  . Long term prescription benzodiazepine use 11/21/2016  . Opiate use 11/21/2016  . Chronic neck pain (Primary Area of Pain) (Left) 11/22/2016  . Cervical facet syndrome (Left) 11/22/2016  . Cervical radiculitis (Left) 11/22/2016  . History of cervical spinal surgery 11/22/2016  . Cervical spondylosis 11/22/2016  . Chronic shoulder pain (Secondary Area of Pain) (Left) 11/22/2016  . Arthropathy of shoulder (Left) 11/22/2016  . Chronic low back pain [facility] Area of Pain) (Bilateral) (L>R) 11/22/2016  . Lumbar facet syndrome (Bilateral) (L>R) 11/22/2016   Resolved Ambulatory Problems    Diagnosis Date Noted  . Deflected nasal septum 02/12/2013   Past Medical History:  Diagnosis Date  . Anxiety   . Anxiety   . Arthritis   . Blood dyscrasia   . CAD (coronary artery disease)   .  Cervical spondylosis without myelopathy 07/24/2014  . Cervicogenic headache 07/24/2014  . Chronic kidney disease   . Depression   . Diabetes mellitus without complication ([place])   . Fatty liver   . GERD (gastroesophageal reflux disease)   . Headache(784.0)   . Hyperlipidemia   . Hypertension   . Mental disorder   . MI, old   . RLS (restless legs syndrome)   . Shortness of breath   . Ventricular fibrillation ([place]) 04/05/2016   Constitutional Exam  General appearance: Well nourished, well developed, and well hydrated. In no apparent acute distress Vitals:   11/22/16 1043  BP: 131/71  Pulse: 96  Resp: 16  Temp: 98.3 F (36.8 C)  TempSrc: Oral  SpO2: 98%  Weight: 197 lb (89.4 kg)  Height: '6\' 2"'$  (1.88 m)   BMI Assessment:  Estimated body mass index is 25.29 kg/m as calculated from the following:   Height as of this encounter: '6\' 2"'$  (1.88 m).   Weight as of this encounter: 197 lb (89.4 kg).  BMI interpretation table: BMI level Category Range association with higher incidence of chronic pain  <18 kg/m2 Underweight   18.5-24.9 kg/m2 Ideal body weight   25-29.9 kg/m2 Overweight Increased incidence by 20%  30-34.9 kg/m2 Obese (Class I) Increased incidence by 68%  35-39.9 kg/m2 Severe obesity (Class II) Increased incidence by 136%  >40 kg/m2 Extreme obesity (Class III) Increased incidence by 254%   BMI Readings from Last 4 Encounters:  11/22/16 25.29 kg/m  09/16/16 25.04 kg/m  09/11/16 25.04 kg/m  07/04/16 25.68 kg/m   Wt Readings from Last 4 Encounters:  11/22/16 197 lb (89.4 kg)  09/16/16 195 lb (88.5 kg)  09/11/16 195 lb (88.5 kg)  07/04/16 200 lb (90.7 kg)  Psych/Mental status: Alert, oriented x 3 (person, place, & time)       Eyes: PERLA Respiratory: No evidence of acute respiratory distress  Cervical Spine Area Exam  Skin & Axial Inspection: No masses, redness, edema, swelling, or associated skin lesions Alignment: Symmetrical Functional ROM: Unrestricted ROM      Stability: No instability detected Muscle Tone/Strength: Functionally intact. No obvious neuro-muscular anomalies detected. Sensory (Neurological): Unimpaired Palpation: No palpable anomalies              Upper Extremity (UE) Exam    Side: Right upper extremity  Side: Left upper extremity  Skin & Extremity Inspection: Skin color, temperature, and hair growth are WNL. No peripheral edema or cyanosis. No masses, redness, swelling, asymmetry, or associated skin lesions. No contractures.  Skin & Extremity Inspection: Skin color, temperature, and hair growth are WNL. No peripheral edema or cyanosis. No masses, redness, swelling, asymmetry, or associated skin lesions. No contractures.  Functional ROM: Unrestricted ROM           Functional ROM: Unrestricted ROM          Muscle Tone/Strength: Functionally intact. No obvious neuro-muscular anomalies detected.  Muscle Tone/Strength: Functionally intact. No obvious neuro-muscular anomalies detected.  Sensory (Neurological): Unimpaired          Sensory (Neurological): Unimpaired          Palpation: No palpable anomalies              Palpation: No palpable anomalies              Specialized Test(s): Deferred         Specialized Test(s): Deferred          Thoracic Spine Area Exam  Skin & Axial  Inspection: No masses, redness, or swelling Alignment: Symmetrical Functional ROM: Unrestricted ROM Stability: No instability detected Muscle Tone/Strength: Functionally intact. No obvious neuro-muscular anomalies detected. Sensory (Neurological): Unimpaired Muscle strength & Tone: No palpable anomalies  Lumbar Spine Area Exam  Skin & Axial Inspection: No masses, redness, or swelling Alignment: Symmetrical Functional ROM: Unrestricted ROM      Stability: No instability detected Muscle Tone/Strength: Functionally intact. No obvious neuro-muscular anomalies detected. Sensory (Neurological): Unimpaired Palpation: No palpable anomalies       Provocative Tests: Lumbar Hyperextension and rotation test: evaluation deferred today       Lumbar Lateral bending test: evaluation deferred today       Patrick's Maneuver: evaluation deferred today                    Gait & Posture Assessment  Ambulation: Unassisted Gait: Relatively normal for age and body habitus Posture: WNL   Lower Extremity Exam    Side: Right lower extremity  Side: Left lower extremity  Skin & Extremity Inspection: Skin color, temperature, and hair growth are WNL. No peripheral edema or cyanosis. No masses, redness, swelling, asymmetry, or associated skin lesions. No contractures.  Skin & Extremity Inspection: Skin color, temperature, and hair growth are WNL. No peripheral edema or cyanosis. No masses, redness,  swelling, asymmetry, or associated skin lesions. No contractures.  Functional ROM: Unrestricted ROM          Functional ROM: Unrestricted ROM          Muscle Tone/Strength: Functionally intact. No obvious neuro-muscular anomalies detected.  Muscle Tone/Strength: Functionally intact. No obvious neuro-muscular anomalies detected.  Sensory (Neurological): Unimpaired  Sensory (Neurological): Unimpaired  Palpation: No palpable anomalies  Palpation: No palpable anomalies   Assessment  Primary Diagnosis & Pertinent Problem List: The primary encounter diagnosis was Chronic pain syndrome. Diagnoses of Long term prescription benzodiazepine use, Opiate use, Chronic neck pain (Primary Area of Pain) (Left), Cervical facet syndrome (Left), Cervical radiculitis (Left), History of cervical spinal surgery, Cervical spondylosis, Chronic shoulder pain (Secondary Area of Pain) (Left), Arthropathy of shoulder (Left), Chronic low back pain (Tertiary Area of Pain) (Bilateral) (L>R), and Lumbar facet syndrome (Bilateral) (L>R) were also pertinent to this visit.  Visit Diagnosis (New problems to examiner): 1. Chronic pain syndrome   2. Long term prescription benzodiazepine use   3. Opiate use   4. Chronic neck pain (Primary Area of Pain) (Left)   5. Cervical facet syndrome (Left)   6. Cervical radiculitis (Left)   7. History of cervical spinal surgery   8. Cervical spondylosis   9. Chronic shoulder pain (Secondary Area of Pain) (Left)   10. Arthropathy of shoulder (Left)   11. Chronic low back pain (Tertiary Area of Pain) (Bilateral) (L>R)   12. Lumbar facet syndrome (Bilateral) (L>R)    Plan of Care (Initial workup plan)  Note: Mr. Obara was reminded that as per protocol, today's visit has been an evaluation only. We have not taken over the patient's controlled substance management.  Problem-specific plan: No problem-specific Assessment & Plan notes found for this encounter.   Lab Orders     Compliance  Drug Analysis, Ur     Comprehensive metabolic panel     C-reactive protein     Sedimentation rate     Magnesium     25-Hydroxyvitamin D Lcms D2+D3     Vitamin B12  Imaging Orders     DG Cervical Spine With Flex & Extend     DG Lumbar  Spine Complete W/Bend     DG Shoulder Left  Referral Orders     Ambulatory referral to Psychology Procedure Orders    No procedure(s) ordered today   Pharmacotherapy (current): Medications ordered:  No orders of the defined types were placed in this encounter.  Medications administered during this visit: Mr. Kornegay had no medications administered during this visit.   Pharmacological management options:  Opioid Analgesics: The patient was informed that there is no guarantee that he would be a candidate for opioid analgesics. The decision will be made following CDC guidelines. This decision will be based on the results of diagnostic studies, as well as Mr. Cryder risk profile.   Membrane stabilizer: To be determined at a later time  Muscle relaxant: To be determined at a later time  NSAID: To be determined at a later time  Other analgesic(s): To be determined at a later time   Interventional management options: Mr. Raygoza was informed that there is no guarantee that he would be a candidate for interventional therapies. The decision will be based on the results of diagnostic studies, as well as Mr. Gitlin risk profile.  Procedure(s) under consideration:  Diagnostic left sided cervical epidural steroid injection  Diagnostic left-sided cervical facet block  Possible left cervical facet radiofrequency ablation  Diagnostic left sided intra-articular shoulder joint injection  Diagnostic left suprascapular nerve block  Possible left suprascapular nerve RFA  Diagnostic bilateral lumbar facet block  Possible bilateral lumbar facet RFA    Provider-requested follow-up: Return for 2nd Visit with Dr. Dossie Arbour after MedPsych eval.  No future  appointments.  Primary Care Physician: Langley Gauss Primary Care Location: [facility] Note by: Gaspar Cola, MD Date: 11/22/2016; Time: 4:35 PM

## 2016-11-22 ENCOUNTER — Ambulatory Visit: Payer: Medicare Other | Attending: Pain Medicine | Admitting: Pain Medicine

## 2016-11-22 ENCOUNTER — Other Ambulatory Visit: Payer: Self-pay | Admitting: Pain Medicine

## 2016-11-22 ENCOUNTER — Encounter: Payer: Self-pay | Admitting: Pain Medicine

## 2016-11-22 VITALS — BP 131/71 | HR 96 | Temp 98.3°F | Resp 16 | Ht 74.0 in | Wt 197.0 lb

## 2016-11-22 DIAGNOSIS — M4722 Other spondylosis with radiculopathy, cervical region: Secondary | ICD-10-CM

## 2016-11-22 DIAGNOSIS — M5412 Radiculopathy, cervical region: Secondary | ICD-10-CM | POA: Insufficient documentation

## 2016-11-22 DIAGNOSIS — M47812 Spondylosis without myelopathy or radiculopathy, cervical region: Secondary | ICD-10-CM

## 2016-11-22 DIAGNOSIS — Z9889 Other specified postprocedural states: Secondary | ICD-10-CM | POA: Insufficient documentation

## 2016-11-22 DIAGNOSIS — F1190 Opioid use, unspecified, uncomplicated: Secondary | ICD-10-CM

## 2016-11-22 DIAGNOSIS — M4692 Unspecified inflammatory spondylopathy, cervical region: Secondary | ICD-10-CM

## 2016-11-22 DIAGNOSIS — M19012 Primary osteoarthritis, left shoulder: Secondary | ICD-10-CM | POA: Insufficient documentation

## 2016-11-22 DIAGNOSIS — M4696 Unspecified inflammatory spondylopathy, lumbar region: Secondary | ICD-10-CM

## 2016-11-22 DIAGNOSIS — M25512 Pain in left shoulder: Secondary | ICD-10-CM | POA: Diagnosis not present

## 2016-11-22 DIAGNOSIS — M47816 Spondylosis without myelopathy or radiculopathy, lumbar region: Secondary | ICD-10-CM | POA: Insufficient documentation

## 2016-11-22 DIAGNOSIS — G894 Chronic pain syndrome: Secondary | ICD-10-CM

## 2016-11-22 DIAGNOSIS — Z79899 Other long term (current) drug therapy: Secondary | ICD-10-CM

## 2016-11-22 DIAGNOSIS — M545 Low back pain, unspecified: Secondary | ICD-10-CM | POA: Insufficient documentation

## 2016-11-22 DIAGNOSIS — G8929 Other chronic pain: Secondary | ICD-10-CM

## 2016-11-22 DIAGNOSIS — M542 Cervicalgia: Secondary | ICD-10-CM | POA: Diagnosis not present

## 2016-11-22 NOTE — Patient Instructions (Addendum)
_______________________________Instructed to get labwork and xrays done today.  Instructed to call for follow up appointment after all tests and med-psych done.  _____________________________________________________________  Appointment Policy Summary  It is our goal and responsibility to provide the medical community with assistance in the evaluation and management of patients with chronic pain. Unfortunately our resources are limited. Because we do not have an unlimited amount of time, or available appointments, we are required to closely monitor and manage their use. The following rules exist to maximize their use:  Patient's responsibilities: 1. Punctuality:  At what time should I arrive? You should be physically present in our office 30 minutes before your scheduled appointment. Your scheduled appointment is with your assigned healthcare provider. However, it takes 5-10 minutes to be "checked-in", and another 15 minutes for the nurses to do the admission. If you arrive to our office at the time you were given for your appointment, you will end up being at least 20-25 minutes late to your appointment with the provider. 2. Tardiness:  What happens if I arrive only a few minutes after my scheduled appointment time? You will need to reschedule your appointment. The cutoff is your appointment time. This is why it is so important that you arrive at least 30 minutes before that appointment. If you have an appointment scheduled for 10:00 AM and you arrive at 10:01, you will be required to reschedule your appointment.  3. Plan ahead:  Always assume that you will encounter traffic on your way in. Plan for it. If you are dependent on a driver, make sure they understand these rules and the need to arrive early. 4. Other appointments and responsibilities:  Avoid scheduling any other appointments before or after your pain clinic appointments.  5. Be prepared:  Write down everything that you need to discuss  with your healthcare provider and give this information to the admitting nurse. Write down the medications that you will need refilled. Bring your pills and bottles (even the empty ones), to all of your appointments, except for those where a procedure is scheduled. 6. No children or pets:  Find someone to take care of them. It is not appropriate to bring them in. 7. Scheduling changes:  We request "advanced notification" of any changes or cancellations. 8. Advanced notification:  Defined as a time period of more than 24 hours prior to the originally scheduled appointment. This allows for the appointment to be offered to other patients. 9. Rescheduling:  When a visit is rescheduled, it will require the cancellation of the original appointment. For this reason they both fall within the category of "Cancellations".  10. Cancellations:  They require advanced notification. Any cancellation less than 24 hours before the  appointment will be recorded as a "No Show". 11. No Show:  Defined as an unkept appointment where the patient failed to notify or declare to the practice their intention or inability to keep the appointment.  Corrective process for repeat offenders:  1. Tardiness: Three (3) episodes of rescheduling due to late arrivals will be recorded as one (1) "No Show". 2. Cancellation or reschedule: Three (3) cancellations or rescheduling will be recorded as one (1) "No Show". 3. "No Shows": Three (3) "No Shows" within a 12 month period will result in discharge from the practice.  ____________________________________________________________________________________________  ____________________________________________________________________________________________  Pain Scale  Introduction: The pain score used by this practice is the Verbal Numerical Rating Scale (VNRS-11). This is an 11-point scale. It is for adults and children 10 years or  older. There are significant differences in how the  pain score is reported, used, and applied. Forget everything you learned in the past and learn this scoring system.  General Information: The scale should reflect your current level of pain. Unless you are specifically asked for the level of your worst pain, or your average pain. If you are asked for one of these two, then it should be understood that it is over the past 24 hours.  Basic Activities of Daily Living (ADL): Personal hygiene, dressing, eating, transferring, and using restroom.  Instructions: Most patients tend to report their level of pain as a combination of two factors, their physical pain and their psychosocial pain. This last one is also known as "suffering" and it is reflection of how physical pain affects you socially and psychologically. From now on, report them separately. From this point on, when asked to report your pain level, report only your physical pain. Use the following table for reference.  Pain Clinic Pain Levels (0-5/10)  Pain Level Score  Description  No Pain 0   Mild pain 1 Nagging, annoying, but does not interfere with basic activities of daily living (ADL). Patients are able to eat, bathe, get dressed, toileting (being able to get on and off the toilet and perform personal hygiene functions), transfer (move in and out of bed or a chair without assistance), and maintain continence (able to control bladder and bowel functions). Blood pressure and heart rate are unaffected. A normal heart rate for a healthy adult ranges from 60 to 100 bpm (beats per minute).   Mild to moderate pain 2 Noticeable and distracting. Impossible to hide from other people. More frequent flare-ups. Still possible to adapt and function close to normal. It can be very annoying and may have occasional stronger flare-ups. With discipline, patients may get used to it and adapt.   Moderate pain 3 Interferes significantly with activities of daily living (ADL). It becomes difficult to feed, bathe, get  dressed, get on and off the toilet or to perform personal hygiene functions. Difficult to get in and out of bed or a chair without assistance. Very distracting. With effort, it can be ignored when deeply involved in activities.   Moderately severe pain 4 Impossible to ignore for more than a few minutes. With effort, patients may still be able to manage work or participate in some social activities. Very difficult to concentrate. Signs of autonomic nervous system discharge are evident: dilated pupils (mydriasis); mild sweating (diaphoresis); sleep interference. Heart rate becomes elevated (>115 bpm). Diastolic blood pressure (lower number) rises above 100 mmHg. Patients find relief in laying down and not moving.   Severe pain 5 Intense and extremely unpleasant. Associated with frowning face and frequent crying. Pain overwhelms the senses.  Ability to do any activity or maintain social relationships becomes significantly limited. Conversation becomes difficult. Pacing back and forth is common, as getting into a comfortable position is nearly impossible. Pain wakes you up from deep sleep. Physical signs will be obvious: pupillary dilation; increased sweating; goosebumps; brisk reflexes; cold, clammy hands and feet; nausea, vomiting or dry heaves; loss of appetite; significant sleep disturbance with inability to fall asleep or to remain asleep. When persistent, significant weight loss is observed due to the complete loss of appetite and sleep deprivation.  Blood pressure and heart rate becomes significantly elevated. Caution: If elevated blood pressure triggers a pounding headache associated with blurred vision, then the patient should immediately seek attention at an urgent or emergency care  unit, as these may be signs of an impending stroke.    Emergency Department Pain Levels (6-10/10)  Emergency Room Pain 6 Severely limiting. Requires emergency care and should not be seen or managed at an outpatient pain  management facility. Communication becomes difficult and requires great effort. Assistance to reach the emergency department may be required. Facial flushing and profuse sweating along with potentially dangerous increases in heart rate and blood pressure will be evident.   Distressing pain 7 Self-care is very difficult. Assistance is required to transport, or use restroom. Assistance to reach the emergency department will be required. Tasks requiring coordination, such as bathing and getting dressed become very difficult.   Disabling pain 8 Self-care is no longer possible. At this level, pain is disabling. The individual is unable to do even the most "basic" activities such as walking, eating, bathing, dressing, transferring to a bed, or toileting. Fine motor skills are lost. It is difficult to think clearly.   Incapacitating pain 9 Pain becomes incapacitating. Thought processing is no longer possible. Difficult to remember your own name. Control of movement and coordination are lost.   The worst pain imaginable 10 At this level, most patients pass out from pain. When this level is reached, collapse of the autonomic nervous system occurs, leading to a sudden drop in blood pressure and heart rate. This in turn results in a temporary and dramatic drop in blood flow to the brain, leading to a loss of consciousness. Fainting is one of the body's self defense mechanisms. Passing out puts the brain in a calmed state and causes it to shut down for a while, in order to begin the healing process.    Summary: 1. Refer to this scale when providing [place] with your pain level. 2. Be accurate and careful when reporting your pain level. This will help with your care. 3. Over-reporting your pain level will lead to loss of credibility. 4. Even a level of 1/10 means that there is pain and will be treated at our facility. 5. High, inaccurate reporting will be documented as "Symptom Exaggeration", leading to loss of  credibility and suspicions of possible secondary gains such as obtaining more narcotics, or wanting to appear disabled, for fraudulent reasons. 6. Only pain levels of 5 or below will be seen at our facility. 7. Pain levels of 6 and above will be sent to the Emergency Department and the appointment cancelled. ____________________________________________________________________________________________

## 2016-11-22 NOTE — Progress Notes (Signed)
Safety precautions to be maintained throughout the outpatient stay will include: orient to surroundings, keep bed in low position, maintain call bell within reach at all times, provide assistance with transfer out of bed and ambulation.  

## 2016-11-23 LAB — SEDIMENTATION RATE: Sed Rate: 2 mm/hr (ref 0–15)

## 2016-11-23 LAB — COMPREHENSIVE METABOLIC PANEL
A/G RATIO: 1.7 (ref 1.2–2.2)
ALBUMIN: 4.3 g/dL (ref 3.5–5.5)
ALT: 15 IU/L (ref 0–44)
AST: 13 IU/L (ref 0–40)
Alkaline Phosphatase: 125 IU/L — ABNORMAL HIGH (ref 39–117)
BUN / CREAT RATIO: 11 (ref 9–20)
BUN: 10 mg/dL (ref 6–24)
CHLORIDE: 100 mmol/L (ref 96–106)
CO2: 24 mmol/L (ref 20–29)
Calcium: 9.7 mg/dL (ref 8.7–10.2)
Creatinine, Ser: 0.9 mg/dL (ref 0.76–1.27)
GFR calc non Af Amer: 104 mL/min/{1.73_m2} (ref >59)
GFR, EST AFRICAN AMERICAN: 121 mL/min/{1.73_m2} (ref >59)
GLOBULIN, TOTAL: 2.5 g/dL (ref 1.5–4.5)
Glucose: 162 mg/dL — ABNORMAL HIGH (ref 65–99)
POTASSIUM: 4.3 mmol/L (ref 3.5–5.2)
Sodium: 142 mmol/L (ref 134–144)
TOTAL PROTEIN: 6.8 g/dL (ref 6.0–8.5)

## 2016-11-23 LAB — MAGNESIUM: MAGNESIUM: 2 mg/dL (ref 1.6–2.3)

## 2016-11-23 LAB — VITAMIN B12: Vitamin B-12: 351 pg/mL (ref 232–1245)

## 2016-11-23 LAB — C-REACTIVE PROTEIN: CRP: 2.6 mg/L (ref 0.0–4.9)

## 2016-11-26 LAB — 25-HYDROXY VITAMIN D LCMS D2+D3
25-Hydroxy, Vitamin D-2: <1 ng/mL
25-Hydroxy, Vitamin D-3: 28 ng/mL
25-Hydroxy, Vitamin D: 29 ng/mL — ABNORMAL LOW

## 2016-11-28 LAB — COMPLIANCE DRUG ANALYSIS, UR

## 2016-11-30 ENCOUNTER — Telehealth (HOSPITAL_COMMUNITY): Payer: Self-pay

## 2016-11-30 ENCOUNTER — Other Ambulatory Visit: Payer: Self-pay

## 2016-12-18 ENCOUNTER — Telehealth: Payer: Self-pay | Admitting: *Deleted

## 2016-12-18 NOTE — Progress Notes (Signed)
Unable to contact patient to sched med psych eval and patient has not completed requested xrays. Cannot send for [facility] notes or [facility]  Until we can reach patient to come in to sign.

## 2016-12-19 ENCOUNTER — Telehealth: Payer: Self-pay | Admitting: Nurse Practitioner

## 2016-12-19 NOTE — Telephone Encounter (Signed)
Wants to make an appt

## 2017-01-03 ENCOUNTER — Emergency Department
Admission: EM | Admit: 2017-01-03 | Discharge: 2017-01-04 | Disposition: A | Discharge status: Home / Self Care | Payer: Medicare Other | Attending: Emergency Medicine | Admitting: Emergency Medicine

## 2017-01-03 DIAGNOSIS — Z7982 Long term (current) use of aspirin: Secondary | ICD-10-CM | POA: Diagnosis not present

## 2017-01-03 DIAGNOSIS — F329 Major depressive disorder, single episode, unspecified: Secondary | ICD-10-CM | POA: Insufficient documentation

## 2017-01-03 DIAGNOSIS — F419 Anxiety disorder, unspecified: Secondary | ICD-10-CM | POA: Insufficient documentation

## 2017-01-03 DIAGNOSIS — J45909 Unspecified asthma, uncomplicated: Secondary | ICD-10-CM | POA: Insufficient documentation

## 2017-01-03 DIAGNOSIS — Z7984 Long term (current) use of oral hypoglycemic drugs: Secondary | ICD-10-CM | POA: Insufficient documentation

## 2017-01-03 DIAGNOSIS — F17210 Nicotine dependence, cigarettes, uncomplicated: Secondary | ICD-10-CM | POA: Insufficient documentation

## 2017-01-03 DIAGNOSIS — N189 Chronic kidney disease, unspecified: Secondary | ICD-10-CM | POA: Insufficient documentation

## 2017-01-03 DIAGNOSIS — R45851 Suicidal ideations: Secondary | ICD-10-CM | POA: Insufficient documentation

## 2017-01-03 DIAGNOSIS — F314 Bipolar disorder, current episode depressed, severe, without psychotic features: Secondary | ICD-10-CM

## 2017-01-03 DIAGNOSIS — I129 Hypertensive chronic kidney disease with stage 1 through stage 4 chronic kidney disease, or unspecified chronic kidney disease: Secondary | ICD-10-CM | POA: Diagnosis not present

## 2017-01-03 DIAGNOSIS — I2510 Atherosclerotic heart disease of native coronary artery without angina pectoris: Secondary | ICD-10-CM | POA: Diagnosis not present

## 2017-01-03 DIAGNOSIS — I252 Old myocardial infarction: Secondary | ICD-10-CM | POA: Insufficient documentation

## 2017-01-03 DIAGNOSIS — Z7902 Long term (current) use of antithrombotics/antiplatelets: Secondary | ICD-10-CM | POA: Insufficient documentation

## 2017-01-03 DIAGNOSIS — E1122 Type 2 diabetes mellitus with diabetic chronic kidney disease: Secondary | ICD-10-CM | POA: Insufficient documentation

## 2017-01-03 LAB — COMPREHENSIVE METABOLIC PANEL
ALK PHOS: 76 U/L (ref 38–126)
ALT: 11 U/L — ABNORMAL LOW (ref 17–63)
ANION GAP: 9 (ref 5–15)
AST: 27 U/L (ref 15–41)
Albumin: 3.4 g/dL — ABNORMAL LOW (ref 3.5–5.0)
BUN: 9 mg/dL (ref 6–20)
CHLORIDE: 108 mmol/L (ref 101–111)
CO2: 23 mmol/L (ref 22–32)
Calcium: 9 mg/dL (ref 8.9–10.3)
Creatinine, Ser: 0.9 mg/dL (ref 0.61–1.24)
GFR calc non Af Amer: >60 mL/min (ref >60)
Glucose, Bld: 182 mg/dL — ABNORMAL HIGH (ref 65–99)
Potassium: 3.4 mmol/L — ABNORMAL LOW (ref 3.5–5.1)
SODIUM: 140 mmol/L (ref 135–145)
Total Bilirubin: 0.4 mg/dL (ref 0.3–1.2)
Total Protein: 6.5 g/dL (ref 6.5–8.1)

## 2017-01-03 LAB — CBC
HEMATOCRIT: 39 % — AB (ref 40.0–52.0)
Hemoglobin: 13.4 g/dL (ref 13.0–18.0)
MCH: 31.2 pg (ref 26.0–34.0)
MCHC: 34.3 g/dL (ref 32.0–36.0)
MCV: 90.8 fL (ref 80.0–100.0)
PLATELETS: 167 10*3/uL (ref 150–440)
RBC: 4.3 MIL/uL — ABNORMAL LOW (ref 4.40–5.90)
RDW: 16.2 % — AB (ref 11.5–14.5)
WBC: 7.6 10*3/uL (ref 3.8–10.6)

## 2017-01-03 LAB — SALICYLATE LEVEL

## 2017-01-03 LAB — ETHANOL

## 2017-01-03 LAB — ACETAMINOPHEN LEVEL: Acetaminophen (Tylenol), Serum: <10 ug/mL — ABNORMAL LOW (ref 10–30)

## 2017-01-03 NOTE — ED Notes (Signed)
Pt. States he lives with mother, pt. Refers to her as "a bitch".  Pt. States he has been living with her for "some time".  Pt. States having HI thoughts about her.  Pt. Asked do have any place to go besides her place, pt. Responds "no".  Pt. Requests to go to [facility].

## 2017-01-03 NOTE — ED Notes (Signed)
IV[facility] 

## 2017-01-03 NOTE — ED Triage Notes (Signed)
Pt brought in by bpd.  Pt is ivc.  Pt reports feeling suicidal.  Pt reports SI.  Pt denies etoh use or drug use.  Pt is calm and cooperative

## 2017-01-04 MED ORDER — HALOPERIDOL 5 MG PO TABS
ORAL_TABLET | ORAL | Status: AC
  Administered 2017-01-04: 5 mg via ORAL
  Filled 2017-01-04: qty 1

## 2017-01-04 MED ORDER — LORAZEPAM 1 MG PO TABS
1.0000 mg | ORAL_TABLET | Freq: Once | ORAL | Status: AC
  Administered 2017-01-04: 1 mg via ORAL

## 2017-01-04 MED ORDER — HALOPERIDOL 5 MG PO TABS
5.0000 mg | ORAL_TABLET | Freq: Once | ORAL | Status: AC
  Administered 2017-01-04: 5 mg via ORAL

## 2017-01-04 MED ORDER — LORAZEPAM 1 MG PO TABS
ORAL_TABLET | ORAL | Status: AC
  Administered 2017-01-04: 1 mg via ORAL
  Filled 2017-01-04: qty 1

## 2017-01-04 NOTE — ED Notes (Signed)
Patient calm and cooperative. Pt stated he did not need to speak with a Education officer, museum and was ready to be discharged. Pt will be picked up by his sister. Discharge paperwork reviewed and signed. All belongings returned to patient. Denies SI/HI and AVH. Maintained on 15 minute checks and observation by security camera for safety.

## 2017-01-04 NOTE — ED Notes (Signed)
To be discharged per soc

## 2017-01-04 NOTE — ED Notes (Signed)
Patient in bed watching TV.  Patient is calm.

## 2017-01-04 NOTE — BH Assessment (Signed)
Assessment Note  Michael Schmitt is an 46 y.o. male presenting to the ED under IVC, initiated by his mother.  Pt reportedly got into a verbal argument with his mother whom he called "a bitch", and then he "said some stuff about killing myself".  He says he has no intention of killing himself but states he has suicidal thoughts all the time.  Pt is well-known to the ED for multiple visits for suicidal ideations and benzo dependence.  Pt denies HI and any auditory/visual hallucinations at this time.  Diagnosis: Depression, SI  Past Medical History:  Past Medical History:  Diagnosis Date  . Anxiety   . Anxiety   . Arthritis    cerv. stenosis, spondylosis, HNP- lower back , has been followed in pain clinic, has  had injection s in cerv. area  . Blood dyscrasia    told that when he was young he was a" free bleeder"  . CAD (coronary artery disease)   . Cervical spondylosis without myelopathy 07/24/2014  . Cervicogenic headache 07/24/2014  . Chronic kidney disease    renal calculi- passed spontaneously  . Depression   . Diabetes mellitus without complication ([place])   . Fatty liver   . GERD (gastroesophageal reflux disease)   . Headache(784.0)   . Hyperlipidemia   . Hypertension   . Mental disorder   . MI, old   . RLS (restless legs syndrome)    detected on sleep study  . Shortness of breath   . Ventricular fibrillation ([place]) 04/05/2016    Past Surgical History:  Procedure Laterality Date  . ANTERIOR CERVICAL DECOMP/DISCECTOMY FUSION  11/13/2011   Procedure: ANTERIOR CERVICAL DECOMPRESSION/DISCECTOMY FUSION 1 LEVEL;  Surgeon: [name], MD;  Location: [place] [facility];  Service: Neurosurgery;  Laterality: N/A;  Anterior Cervical Decompression/discectomy Fusion. Cervical three-four.  Marland Kitchen CARDIAC CATHETERIZATION N/A 01/01/2015   Procedure: Left Heart Cath and Coronary Angiography;  Surgeon: [name], MD;  Location: [place] [facility];  Service: Cardiovascular;  Laterality: N/A;  .  CARDIAC CATHETERIZATION N/A 04/05/2016   Procedure: Left Heart Cath and Coronary Angiography;  Surgeon: Belva Crome, MD;  Location: [place] [facility];  Service: Cardiovascular;  Laterality: N/A;  . CARDIAC CATHETERIZATION N/A 04/05/2016   Procedure: Coronary Stent Intervention;  Surgeon: Belva Crome, MD;  Location: [place] [facility];  Service: Cardiovascular;  Laterality: N/A;  . CARDIAC CATHETERIZATION N/A 04/05/2016   Procedure: Intravascular Ultrasound/IVUS;  Surgeon: Belva Crome, MD;  Location: [place] [facility];  Service: Cardiovascular;  Laterality: N/A;  . NASAL SINUS SURGERY     2005    Family History:  Family History  Problem Relation Age of Onset  . Prostate cancer Father   . Hypertension Mother   . Kidney Stones Mother   . Anxiety disorder Mother   . Depression Mother   . COPD Sister   . Hypertension Sister   . Diabetes Sister   . Depression Sister   . Anxiety disorder Sister   . Seizures Sister   . ADD / ADHD Son   . ADD / ADHD Daughter     Social History:  reports that he has been smoking Cigarettes and Cigars.  He has a 34.00 pack-year smoking history. He has quit using smokeless tobacco. He reports that he does not drink alcohol or use drugs.  Additional Social History:  Alcohol / Drug Use Pain Medications: See PTA Prescriptions: See PTA Over the Counter: See PTA  CIWA: CIWA-Ar BP: Marland Kitchen)  143/80 Pulse Rate: 95 COWS:    Allergies:  Allergies  Allergen Reactions  . Prednisone Other (See Comments)    Hypertension, makes him feel spacey  . Hydrocodone Other (See Comments)    Headache, irritable  . Varenicline Other (See Comments)    Suicidal thoughts    Home Medications:  (Not in a hospital admission)  OB/GYN Status:  No LMP for male patient.  General Assessment Data Location of Assessment: [facility] TTS Assessment: In system Is this a Tele or Face-to-Face Assessment?: Face-to-Face Is this an Initial Assessment or a Re-assessment for this  encounter?: Initial Assessment Marital status: Single Maiden name: n/a Is patient pregnant?: No Pregnancy Status: No Living Arrangements: Parent Can pt return to current living arrangement?: Yes Admission Status: Involuntary Is patient capable of signing voluntary admission?: No Referral Source: Self/Family/Friend Insurance type: [facility] Medicare     Crisis Care Plan Living Arrangements: Parent Legal Guardian: Other: (self) Name of Psychiatrist: [place] Name of Therapist: [facility]  Education Status Is patient currently in school?: No Current Grade: na Highest grade of school patient has completed: 11th Name of school: na Contact person: na  Risk to self with the past 6 months Suicidal Ideation: Yes-Currently Present Has patient been a risk to self within the past 6 months prior to admission? : Yes Suicidal Intent: No Has patient had any suicidal intent within the past 6 months prior to admission? : Yes Is patient at risk for suicide?: No Suicidal Plan?: No Has patient had any suicidal plan within the past 6 months prior to admission? : No Access to Means: Yes Specify Access to Suicidal Means: pt has access to pills What has been your use of drugs/alcohol within the last 12 months?: pt denes Previous Attempts/Gestures: Yes How many times?: 2 Other Self Harm Risks: none identified Triggers for Past Attempts: Family contact Intentional Self Injurious Behavior: None Family Suicide History: No Recent stressful life event(s): Conflict (Comment) Persecutory voices/beliefs?: No Depression: Yes Depression Symptoms: Loss of interest in usual pleasures, Feeling worthless/self pity, Feeling angry/irritable Substance abuse history and/or treatment for substance abuse?: No Suicide prevention information given to non-admitted patients: Not applicable  Risk to Others within the past 6 months Homicidal Ideation: Yes-Currently Present Does patient have any lifetime risk of violence toward  others beyond the six months prior to admission? : No Thoughts of Harm to Others: Yes-Currently Present Comment - Thoughts of Harm to Others: Pt reports thoughts of harming his mother Current Homicidal Intent: No Current Homicidal Plan: No Access to Homicidal Means: No Identified Victim: pt's mother History of harm to others?: No Assessment of Violence: None Noted Does patient have access to weapons?: No Criminal Charges Pending?: No Does patient have a court date: No Is patient on probation?: Unknown  Psychosis Hallucinations: None noted Delusions: None noted  Mental Status Report Appearance/Hygiene: In scrubs Eye Contact: Fair Motor Activity: Freedom of movement Speech: Logical/coherent Level of Consciousness: Alert, Irritable Mood: Irritable, Anxious Affect: Anxious, Irritable Anxiety Level: Minimal Thought Processes: Relevant Judgement: Partial Orientation: Person, Place, Time, Situation Obsessive Compulsive Thoughts/Behaviors: None  Cognitive Functioning Concentration: Normal Memory: Recent Intact, Remote Intact IQ: Average Insight: Fair Impulse Control: Fair Appetite: Good Sleep: No Change Vegetative Symptoms: None  ADLScreening [facility] Assessment Services) Patient's cognitive ability adequate to safely complete daily activities?: Yes Patient able to express need for assistance with ADLs?: Yes Independently performs ADLs?: Yes (appropriate for developmental age)  Prior Inpatient Therapy Prior Inpatient Therapy: Yes Prior Therapy Dates: 2018,2017 Prior Therapy Facilty/Provider(s):  [place], [place], [place] Reason for Treatment: depression  Prior Outpatient Therapy Prior Outpatient Therapy: Yes Prior Therapy Dates: current Prior Therapy Facilty/Provider(s): [facility] Reason for Treatment: depression Does patient have an ACCT team?: No Does patient have Intensive In-House Services?  : No Does patient have [facility] services? : No Does patient have P4CC services?:  No  ADL Screening (condition at time of admission) Patient's cognitive ability adequate to safely complete daily activities?: Yes Patient able to express need for assistance with ADLs?: Yes Independently performs ADLs?: Yes (appropriate for developmental age)       Abuse/Neglect Assessment (Assessment to be complete while patient is alone) Physical Abuse: Denies Verbal Abuse: Denies Sexual Abuse: Denies Exploitation of patient/patient's resources: Denies Self-Neglect: Denies Values / Beliefs Cultural Requests During Hospitalization: None Spiritual Requests During Hospitalization: None Consults Spiritual Care Consult Needed: No Social Work Consult Needed: No Regulatory affairs officer (For Healthcare) Does Patient Have a Medical Advance Directive?: No    Additional Information 1:1 In Past 12 Months?: No CIRT Risk: No Elopement Risk: No Does patient have medical clearance?: Yes     Disposition:  Disposition Initial Assessment Completed for this Encounter: Yes Disposition of Patient: Pending Review with psychiatrist  On Site Evaluation by:   Reviewed with Physician:    Oneita Hurt 01/04/2017 2:46 AM

## 2017-01-04 NOTE — ED Notes (Signed)
Patient irritable in dayroom stating, "I need help.  I'm not normal.  I just feel like hurting myself.  I want to punch myself because I'm a bad person."  Patient's fist are clenched as he paces the floor.  Staff verbally deescalated patient.  MD contacted for medication order.

## 2017-01-04 NOTE — ED Notes (Signed)
SOC in progress with Dr Mitzi Hansen.

## 2017-01-04 NOTE — ED Provider Notes (Signed)
[facility] Emergency Department Provider Note  ____________________________________________   First MD Initiated Contact with Patient 01/03/17 2256     (approximate)  I have reviewed the triage vital signs and the nursing notes.   HISTORY  Chief Complaint Suicidal  Level 5 caveat:  history/ROS limited by active psychosis / mental illness / altered mental status   HPI Michael Schmitt is a 46 y.o. male who is well-known to this emergency Department for multiple visits for a variety of complaints but typically related to psychiatric complaints including suicidal ideation and prior benzo abuse/dependence.  He presents tonight under involuntary commitment by the [employer].  He reports that he was placed under commitment because he got into a verbal argument with his mother whom he called "a bitch", and then he "said some stuff about killing myself".  She called the police after those comments.  He says he has no intention of killing himself although when he was triaged he said he was still having suicidal thoughts.  He claims he has suicidal thoughts all the time.  He denies any new or acute medical issue, specifically including fever/chills, chest pain, shortness of breath, nausea, vomiting, and abdominal pain.  He says the situation was serious and occurred acutely but he feels fine now.   Past Medical History:  Diagnosis Date  . Anxiety   . Anxiety   . Arthritis    cerv. stenosis, spondylosis, HNP- lower back , has been followed in pain clinic, has  had injection s in cerv. area  . Blood dyscrasia    told that when he was young he was a" free bleeder"  . CAD (coronary artery disease)   . Cervical spondylosis without myelopathy 07/24/2014  . Cervicogenic headache 07/24/2014  . Chronic kidney disease    renal calculi- passed spontaneously  . Depression   . Diabetes mellitus without complication ([place])   . Fatty liver   . GERD  (gastroesophageal reflux disease)   . Headache(784.0)   . Hyperlipidemia   . Hypertension   . Mental disorder   . MI, old   . RLS (restless legs syndrome)    detected on sleep study  . Shortness of breath   . Ventricular fibrillation ([place]) 04/05/2016    Patient Active Problem List   Diagnosis Date Noted  . Chronic neck pain (Primary Area of Pain) (Left) 11/22/2016  . Cervical facet syndrome (Left) 11/22/2016  . Cervical radiculitis (Left) 11/22/2016  . History of cervical spinal surgery 11/22/2016  . Cervical spondylosis 11/22/2016  . Chronic shoulder pain (Secondary Area of Pain) (Left) 11/22/2016  . Arthropathy of shoulder (Left) 11/22/2016  . Chronic low back pain [facility] Area of Pain) (Bilateral) (L>R) 11/22/2016  . Lumbar facet syndrome (Bilateral) (L>R) 11/22/2016  . Chronic pain syndrome 11/21/2016  . Long term prescription benzodiazepine use 11/21/2016  . Opiate use 11/21/2016  . Uncontrolled type 2 diabetes mellitus without complication, without long-term current use of insulin ([place]) 10/19/2016  . Chronic Suicidal ideation 09/15/2016  . Coronary artery disease 09/12/2016  . MDD (major depressive disorder), severe ([place]) 09/12/2016  . HTN (hypertension) 06/26/2016  . GERD (gastroesophageal reflux disease) 06/26/2016  . Alcohol use disorder, severe, 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[place]) 06/26/2016  . Moderate benzodiazepine use disorder ([place]) 06/11/2016  . Sedative, hypnotic or anxiolytic use disorder, severe, dependence ([place]) 05/12/2016  . Ventricular fibrillation ([place]) 04/05/2016  . Combined hyperlipidemia 01/05/2016  . Cervical spondylosis without myelopathy 07/24/2014  . Severe episode of recurrent  major depressive disorder, without psychotic features ([place]) 04/15/2013  . Tobacco use disorder 10/20/2010  . Asthma 10/20/2010    Past Surgical History:  Procedure Laterality Date  . ANTERIOR CERVICAL DECOMP/DISCECTOMY FUSION  11/13/2011   Procedure: ANTERIOR CERVICAL  DECOMPRESSION/DISCECTOMY FUSION 1 LEVEL;  Surgeon: [name], MD;  Location: [place] [facility];  Service: Neurosurgery;  Laterality: N/A;  Anterior Cervical Decompression/discectomy Fusion. Cervical three-four.  Marland Kitchen CARDIAC CATHETERIZATION N/A 01/01/2015   Procedure: Left Heart Cath and Coronary Angiography;  Surgeon: [name], MD;  Location: [place] [facility];  Service: Cardiovascular;  Laterality: N/A;  . CARDIAC CATHETERIZATION N/A 04/05/2016   Procedure: Left Heart Cath and Coronary Angiography;  Surgeon: Belva Crome, MD;  Location: [place] [facility];  Service: Cardiovascular;  Laterality: N/A;  . CARDIAC CATHETERIZATION N/A 04/05/2016   Procedure: Coronary Stent Intervention;  Surgeon: Belva Crome, MD;  Location: [place] [facility];  Service: Cardiovascular;  Laterality: N/A;  . CARDIAC CATHETERIZATION N/A 04/05/2016   Procedure: Intravascular Ultrasound/IVUS;  Surgeon: Belva Crome, MD;  Location: [place] [facility];  Service: Cardiovascular;  Laterality: N/A;  . NASAL SINUS SURGERY     2005    Prior to Admission medications   Medication Sig Start Date End Date Taking? Authorizing Provider  aspirin EC 81 MG EC tablet Take 1 tablet (81 mg total) by mouth daily. 09/15/16   Hildred Priest, MD  atorvastatin (LIPITOR) 80 MG tablet Take 1 tablet (80 mg total) by mouth daily at 6 PM. 09/14/16   Hildred Priest, MD  carvedilol (COREG) 6.25 MG tablet Take 1 tablet (6.25 mg total) by mouth 2 (two) times daily with a meal. 09/14/16   Hildred Priest, MD  clopidogrel (PLAVIX) 75 MG tablet Take 1 tablet (75 mg total) by mouth daily. 09/15/16   Hildred Priest, MD  divalproex (DEPAKOTE) 500 MG DR tablet Take 500 mg by mouth 2 (two) times daily.    [provider]  gabapentin (NEURONTIN) 400 MG capsule Take 2 capsules (800 mg total) by mouth 4 (four) times daily. 09/14/16   Hildred Priest, MD  hydrOXYzine (ATARAX/VISTARIL) 50 MG tablet Take  50 mg by mouth 3 (three) times daily as needed.    [provider]  metFORMIN (GLUCOPHAGE) 500 MG tablet Take 1 tablet (500 mg total) by mouth 2 (two) times daily with a meal. 09/14/16   Hildred Priest, MD  mirtazapine (REMERON) 45 MG tablet Take 1 tablet (45 mg total) by mouth at bedtime. 09/14/16   Hildred Priest, MD  nitroGLYCERIN (NITROSTAT) 0.4 MG SL tablet Place 1 tablet (0.4 mg total) under the tongue every 5 (five) minutes x 3 doses as needed. 09/14/16 09/14/17  Hildred Priest, MD  prazosin (MINIPRESS) 2 MG capsule Take 2 mg by mouth at bedtime.    [provider]  risperiDONE (RISPERDAL) 2 MG tablet Take 2 mg by mouth at bedtime.    [provider]  venlafaxine XR (EFFEXOR-XR) 150 MG 24 hr capsule Take 150 mg by mouth daily with breakfast.    [provider]    Allergies Prednisone; Hydrocodone; and Varenicline  Family History  Problem Relation Age of Onset  . Prostate cancer Father   . Hypertension Mother   . Kidney Stones Mother   . Anxiety disorder Mother   . Depression Mother   . COPD Sister   . Hypertension Sister   . Diabetes Sister   . Depression Sister   . Anxiety disorder Sister   .  Seizures Sister   . ADD / ADHD Son   . ADD / ADHD Daughter     Social History Social History  Substance Use Topics  . Smoking status: Current Every Day Smoker    Packs/day: 2.00    Years: 17.00    Types: Cigarettes, Cigars  . Smokeless tobacco: Former Systems developer     Comment: occassional snuff   . Alcohol use No     Comment: last drinked in 3 months.     Review of Systems Constitutional: No fever/chills Cardiovascular: Denies chest pain. Respiratory: Denies shortness of breath. Gastrointestinal: No abdominal pain.  No nausea, no vomiting.  No diarrhea.  No constipation. Musculoskeletal: Negative for neck pain.  Negative for back pain. Integumentary: Negative for rash. Neurological: Negative for headaches, focal  weakness or numbness. Psychiatric:Admits to frequent but "no worse than usual" suicidal ideation.  ____________________________________________   PHYSICAL EXAM:  VITAL SIGNS: ED Triage Vitals  Enc Vitals Group     BP 01/03/17 2114 (!) 143/80     Pulse Rate 01/03/17 2114 95     Resp 01/03/17 2114 20     Temp 01/03/17 2114 98.1 F (36.7 C)     Temp Source 01/03/17 2114 Oral     SpO2 01/03/17 2114 95 %     Weight 01/03/17 2113 88.5 kg (195 lb)     Height 01/03/17 2113 1.88 m (6\' 2" )     Head Circumference --      Peak Flow --      Pain Score --      Pain Loc --      Pain Edu? --      Excl. in [place]? --     Constitutional: Alert and oriented. Well appearing and in no acute distress. Eyes: Conjunctivae are normal.  Head: Atraumatic. Cardiovascular: Normal rate, regular rhythm. Good peripheral circulation. Grossly normal heart sounds. Respiratory: Normal respiratory effort.  No retractions. Lungs CTAB. Gastrointestinal: Soft and nontender. No distention.  Musculoskeletal: No lower extremity tenderness nor edema. No gross deformities of extremities. Neurologic:  Normal speech and language. No gross focal neurologic deficits are appreciated.  Skin:  Skin is warm, dry and intact. No rash noted. Psychiatric: Mood and affect are Somewhat irritable but essentially normal.  He reports chronic suicidal ideation but states with apparent exasperation: "I'm not going to go it!" ____________________________________________   LABS (all labs ordered are listed, but only abnormal results are displayed)  Labs Reviewed  COMPREHENSIVE METABOLIC PANEL - Abnormal; Notable for the following:       Result Value   Potassium 3.4 (*)    Glucose, Bld 182 (*)    Albumin 3.4 (*)    ALT 11 (*)    All other components within normal limits  ACETAMINOPHEN LEVEL - Abnormal; Notable for the following:    Acetaminophen (Tylenol), Serum <10 (*)    All other components within normal limits  CBC - Abnormal;  Notable for the following:    RBC 4.30 (*)    HCT 39.0 (*)    RDW 16.2 (*)    All other components within normal limits  ETHANOL  SALICYLATE LEVEL  URINE DRUG SCREEN, QUALITATIVE ([facility] ONLY)   ____________________________________________  EKG  None - EKG not ordered by ED physician ____________________________________________  RADIOLOGY   No results found.  ____________________________________________   PROCEDURES  Critical Care performed: No   Procedure(s) performed:   Procedures   ____________________________________________   INITIAL IMPRESSION / ASSESSMENT AND PLAN / ED COURSE  As part of my medical decision making, I reviewed the following data within the [facility] reviewed  and Notes from prior ED visits    Lab work is unremarkable with no acute abnormalities.  The patient has a negative alcohol level and has not yet given a urine sample for a urine drug screen.  Given that he has a complicated psychiatric history and has been admitted sometimes and discharged others, I will uphold the IVC and have ordered a telepsych consultation.  No evidence of any acute or emergent medical condition at this time.  The patient understands the plan.    ____________________________________________  FINAL CLINICAL IMPRESSION(S) / ED DIAGNOSES  Final diagnoses:  Suicidal ideation     MEDICATIONS GIVEN DURING THIS VISIT:  Medications - No data to display   NEW OUTPATIENT MEDICATIONS STARTED DURING THIS VISIT:  New Prescriptions   No medications on file    Modified Medications   No medications on file    Discontinued Medications   No medications on file     Note:  This document was prepared using Dragon voice recognition software and may include unintentional dictation errors.    Hinda Kehr, MD 01/04/17 [id number]

## 2017-01-04 NOTE — ED Notes (Signed)
Pt. Moved from quad #22A to [place] #1

## 2017-01-04 NOTE — ED Notes (Signed)
SOC complete.  

## 2017-01-04 NOTE — ED Provider Notes (Signed)
patient evaluated by specialist on-call psychiatrist Dr. Leland Johns reversed the patient's involuntary commitment and recommended discharge home. Based on his recommendation the patient was discharged from the emergency department. Patient has no suicidal or homicidal ideations at this time.   Gregor Hams, MD 01/04/17 [id number]

## 2017-01-04 NOTE — ED Notes (Signed)
Pt discharged to lobby. Denies SI/HI and AVH. All belongings returned to patient. Discharge paperwork signed and reviewed.

## 2017-01-27 ENCOUNTER — Encounter: Payer: Self-pay | Admitting: Emergency Medicine

## 2017-01-27 ENCOUNTER — Emergency Department
Admission: EM | Admit: 2017-01-27 | Discharge: 2017-01-27 | Disposition: A | Discharge status: Home / Self Care | Payer: Medicare Other | Attending: Emergency Medicine | Admitting: Emergency Medicine

## 2017-01-27 DIAGNOSIS — R112 Nausea with vomiting, unspecified: Secondary | ICD-10-CM | POA: Diagnosis present

## 2017-01-27 DIAGNOSIS — Z5321 Procedure and treatment not carried out due to patient leaving prior to being seen by health care provider: Secondary | ICD-10-CM | POA: Diagnosis not present

## 2017-01-27 NOTE — ED Triage Notes (Signed)
Achy all over , with N/V , pt has concerns the amount of goody powders he takes a day

## 2017-01-27 NOTE — ED Notes (Addendum)
No answer when called from lobby/flexwait x1.

## 2017-01-27 NOTE — ED Notes (Signed)
This EDT called for pt x3 in both the Main Lobby and in [place] [facility] waiting room. Pt was no where to be found./ Triage first nurse was notified

## 2017-02-04 ENCOUNTER — Other Ambulatory Visit: Payer: Self-pay

## 2017-02-04 ENCOUNTER — Emergency Department
Admission: EM | Admit: 2017-02-04 | Discharge: 2017-02-05 | Disposition: A | Discharge status: Home / Self Care | Payer: Medicare Other | Attending: Emergency Medicine | Admitting: Emergency Medicine

## 2017-02-04 DIAGNOSIS — E785 Hyperlipidemia, unspecified: Secondary | ICD-10-CM | POA: Insufficient documentation

## 2017-02-04 DIAGNOSIS — F322 Major depressive disorder, single episode, severe without psychotic features: Secondary | ICD-10-CM | POA: Diagnosis not present

## 2017-02-04 DIAGNOSIS — Z79899 Other long term (current) drug therapy: Secondary | ICD-10-CM | POA: Insufficient documentation

## 2017-02-04 DIAGNOSIS — F17210 Nicotine dependence, cigarettes, uncomplicated: Secondary | ICD-10-CM | POA: Diagnosis not present

## 2017-02-04 DIAGNOSIS — Z7982 Long term (current) use of aspirin: Secondary | ICD-10-CM | POA: Diagnosis not present

## 2017-02-04 DIAGNOSIS — R45851 Suicidal ideations: Secondary | ICD-10-CM

## 2017-02-04 DIAGNOSIS — E119 Type 2 diabetes mellitus without complications: Secondary | ICD-10-CM | POA: Diagnosis not present

## 2017-02-04 DIAGNOSIS — I2510 Atherosclerotic heart disease of native coronary artery without angina pectoris: Secondary | ICD-10-CM | POA: Insufficient documentation

## 2017-02-04 DIAGNOSIS — E1165 Type 2 diabetes mellitus with hyperglycemia: Secondary | ICD-10-CM

## 2017-02-04 DIAGNOSIS — F32A Depression, unspecified: Secondary | ICD-10-CM

## 2017-02-04 DIAGNOSIS — I10 Essential (primary) hypertension: Secondary | ICD-10-CM | POA: Insufficient documentation

## 2017-02-04 DIAGNOSIS — F411 Generalized anxiety disorder: Secondary | ICD-10-CM

## 2017-02-04 DIAGNOSIS — I252 Old myocardial infarction: Secondary | ICD-10-CM | POA: Diagnosis not present

## 2017-02-04 DIAGNOSIS — F329 Major depressive disorder, single episode, unspecified: Secondary | ICD-10-CM | POA: Insufficient documentation

## 2017-02-04 LAB — CBC
HEMATOCRIT: 46.1 % (ref 40.0–52.0)
HEMOGLOBIN: 15.1 g/dL (ref 13.0–18.0)
MCH: 30.4 pg (ref 26.0–34.0)
MCHC: 32.7 g/dL (ref 32.0–36.0)
MCV: 93 fL (ref 80.0–100.0)
Platelets: 191 10*3/uL (ref 150–440)
RBC: 4.96 MIL/uL (ref 4.40–5.90)
RDW: 15.6 % — ABNORMAL HIGH (ref 11.5–14.5)
WBC: 5.6 10*3/uL (ref 3.8–10.6)

## 2017-02-04 LAB — COMPREHENSIVE METABOLIC PANEL
ALT: 17 U/L (ref 17–63)
AST: 28 U/L (ref 15–41)
Albumin: 4.3 g/dL (ref 3.5–5.0)
Alkaline Phosphatase: 84 U/L (ref 38–126)
Anion gap: 13 (ref 5–15)
BUN: 11 mg/dL (ref 6–20)
CHLORIDE: 104 mmol/L (ref 101–111)
CO2: 21 mmol/L — AB (ref 22–32)
CREATININE: 0.92 mg/dL (ref 0.61–1.24)
Calcium: 9 mg/dL (ref 8.9–10.3)
GFR calc Af Amer: >60 mL/min (ref >60)
GLUCOSE: 179 mg/dL — AB (ref 65–99)
Potassium: 4 mmol/L (ref 3.5–5.1)
SODIUM: 138 mmol/L (ref 135–145)
Total Bilirubin: 0.1 mg/dL — ABNORMAL LOW (ref 0.3–1.2)
Total Protein: 7.1 g/dL (ref 6.5–8.1)

## 2017-02-04 LAB — URINE DRUG SCREEN, QUALITATIVE (ARMC ONLY)
Amphetamines, Ur Screen: NOT DETECTED
Barbiturates, Ur Screen: NOT DETECTED
Benzodiazepine, Ur Scrn: NOT DETECTED
CANNABINOID 50 NG, UR ~~LOC~~: NOT DETECTED
COCAINE METABOLITE, UR ~~LOC~~: NOT DETECTED
MDMA (ECSTASY) UR SCREEN: NOT DETECTED
Methadone Scn, Ur: NOT DETECTED
Opiate, Ur Screen: NOT DETECTED
PHENCYCLIDINE (PCP) UR S: NOT DETECTED
Tricyclic, Ur Screen: NOT DETECTED

## 2017-02-04 LAB — ACETAMINOPHEN LEVEL: Acetaminophen (Tylenol), Serum: 20 ug/mL (ref 10–30)

## 2017-02-04 LAB — SALICYLATE LEVEL: Salicylate Lvl: <7 mg/dL (ref 2.8–30.0)

## 2017-02-04 LAB — ETHANOL: Alcohol, Ethyl (B): <10 mg/dL (ref <10)

## 2017-02-04 MED ORDER — LORAZEPAM 1 MG PO TABS
1.0000 mg | ORAL_TABLET | Freq: Once | ORAL | Status: AC
  Administered 2017-02-04: 1 mg via ORAL
  Filled 2017-02-04: qty 1

## 2017-02-04 MED ORDER — OLANZAPINE 5 MG PO TABS
5.0000 mg | ORAL_TABLET | Freq: Two times a day (BID) | ORAL | Status: DC
  Administered 2017-02-04 – 2017-02-05 (×3): 5 mg via ORAL
  Filled 2017-02-04 (×3): qty 1

## 2017-02-04 MED ORDER — DIVALPROEX SODIUM 500 MG PO DR TAB
500.0000 mg | DELAYED_RELEASE_TABLET | Freq: Every day | ORAL | Status: DC
Start: 2017-02-04 — End: 2017-02-05
  Administered 2017-02-04: 500 mg via ORAL
  Filled 2017-02-04: qty 1

## 2017-02-04 MED ORDER — ATORVASTATIN CALCIUM 20 MG PO TABS
80.0000 mg | ORAL_TABLET | Freq: Every day | ORAL | Status: DC
  Administered 2017-02-04: 80 mg via ORAL
  Filled 2017-02-04: qty 4

## 2017-02-04 MED ORDER — CARVEDILOL 6.25 MG PO TABS
6.2500 mg | ORAL_TABLET | Freq: Two times a day (BID) | ORAL | Status: DC
Start: 2017-02-04 — End: 2017-02-05
  Administered 2017-02-04 – 2017-02-05 (×2): 6.25 mg via ORAL
  Filled 2017-02-04 (×4): qty 1

## 2017-02-04 MED ORDER — PRAZOSIN HCL 2 MG PO CAPS
2.0000 mg | ORAL_CAPSULE | Freq: Every day | ORAL | Status: DC
  Administered 2017-02-04: 2 mg via ORAL
  Filled 2017-02-04 (×2): qty 1

## 2017-02-04 MED ORDER — CLOPIDOGREL BISULFATE 75 MG PO TABS
75.0000 mg | ORAL_TABLET | Freq: Every day | ORAL | Status: DC
  Administered 2017-02-04 – 2017-02-05 (×2): 75 mg via ORAL
  Filled 2017-02-04 (×5): qty 1

## 2017-02-04 MED ORDER — METFORMIN HCL 500 MG PO TABS
500.0000 mg | ORAL_TABLET | Freq: Two times a day (BID) | ORAL | Status: DC
  Administered 2017-02-04 – 2017-02-05 (×2): 500 mg via ORAL
  Filled 2017-02-04 (×2): qty 1

## 2017-02-04 MED ORDER — MIRTAZAPINE 15 MG PO TABS
15.0000 mg | ORAL_TABLET | Freq: Every day | ORAL | Status: DC
  Administered 2017-02-04: 15 mg via ORAL
  Filled 2017-02-04: qty 1

## 2017-02-04 MED ORDER — ASPIRIN EC 81 MG PO TBEC
81.0000 mg | DELAYED_RELEASE_TABLET | Freq: Every day | ORAL | Status: DC
  Administered 2017-02-04 – 2017-02-05 (×2): 81 mg via ORAL
  Filled 2017-02-04 (×2): qty 1

## 2017-02-04 MED ORDER — GABAPENTIN 300 MG PO CAPS
800.0000 mg | ORAL_CAPSULE | Freq: Four times a day (QID) | ORAL | Status: DC
  Administered 2017-02-04 – 2017-02-05 (×4): 800 mg via ORAL
  Filled 2017-02-04 (×5): qty 2

## 2017-02-04 NOTE — ED Notes (Signed)
Lunch tray placed on sink area. Pt sleeping

## 2017-02-04 NOTE — ED Notes (Signed)
Patient received PM snack. 

## 2017-02-04 NOTE — ED Notes (Addendum)
Sister: Zayden Maffei [phone])

## 2017-02-04 NOTE — ED Notes (Signed)
Pt brought to [facility] room 3. Calm and cooperative with staff.  Pt states he wants help dealing with his aggression. Most of his anger is directed towards his mother. Pt feels his home medications are no longer effective. Pt knows the [facility] has recommended inpatient admission. Pt accepting. Pt compliant with medications. Maintained on 15 minute checks and observation by security camera for safety.
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
## 2017-02-04 NOTE — ED Triage Notes (Signed)
Pt arrives to ED via POV voluntarily with c/o "suicidal thoughts" and "aggressive tendencies". Pt reports being seen and treated for same here and [facility], taking medications as prescribed without affect. Pt is A&O, in NAD; RR even, regular, and unlabored.

## 2017-02-04 NOTE — ED Notes (Signed)
Gave update on plan of care to pt's sister after security code verified.

## 2017-02-04 NOTE — ED Notes (Signed)
Patient is IVC and is pending inpatient admission.

## 2017-02-04 NOTE — ED Notes (Signed)
Patient resting quietly in room. No noted distress or abnormal behaviors noted. Will continue 15 minute checks and observation by security camera for safety. 
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
## 2017-02-04 NOTE — ED Notes (Signed)
Vol p SOC
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
## 2017-02-04 NOTE — ED Notes (Signed)

## 2017-02-04 NOTE — ED Notes (Signed)
Report was received from Amy B., RN; Pt. Verbalizes  complaints of having Depression; and distress of having family issues; verbalizes having S.I. With a plan to O.D. ;and denies having Hi. Continue to monitor with 15 min. Monitoring.

## 2017-02-05 DIAGNOSIS — F322 Major depressive disorder, single episode, severe without psychotic features: Secondary | ICD-10-CM | POA: Diagnosis not present

## 2017-02-05 DIAGNOSIS — F411 Generalized anxiety disorder: Secondary | ICD-10-CM

## 2017-02-05 MED ORDER — VENLAFAXINE HCL ER 75 MG PO CP24: 225.0000 mg | ORAL_CAPSULE | Freq: Every day | ORAL | 1 refills | Status: DC

## 2017-02-05 NOTE — Consult Note (Signed)
[facility] Consult   Reason for Consult: Consult for 46 year old man with long-standing mood and anxiety problems who came to the emergency room seeking relief Referring Physician: McShane Patient Identification: Michael Schmitt MRN:  [id number] Principal Diagnosis: MDD (major depressive disorder), severe ([place]) Diagnosis:   Patient Active Problem List   Diagnosis Date Noted  . Generalized anxiety disorder [F41.1] 02/05/2017  . Chronic neck pain (Primary Area of Pain) (Left) [M54.2, G89.29] 11/22/2016  . Cervical facet syndrome (Left) [D98.338] 11/22/2016  . Cervical radiculitis (Left) [M54.12] 11/22/2016  . History of cervical spinal surgery [Z98.890] 11/22/2016  . Cervical spondylosis [M47.22] 11/22/2016  . Chronic shoulder pain (Secondary Area of Pain) (Left) [M25.512, G89.29] 11/22/2016  . 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 Chronic low back pain [facility] Area of Pain) (Bilateral) (L>R) [M54.5, G89.29] 11/22/2016  . Lumbar facet syndrome (Bilateral) (L>R) [M47.816] 11/22/2016  . Chronic pain syndrome [G89.4] 11/21/2016  . Long term prescription benzodiazepine use [Z79.899] 11/21/2016  . Opiate use [F11.90] 11/21/2016  . Uncontrolled type 2 diabetes mellitus without complication, without long-term current use of insulin ([place]) [E11.65] 10/19/2016  . Chronic Suicidal ideation [R45.851] 09/15/2016  . Coronary artery disease [I25.10] 09/12/2016  . MDD (major depressive disorder), severe ([place]) [F32.2] 09/12/2016  . HTN (hypertension) [I10] 06/26/2016  . GERD (gastroesophageal reflux disease) [K21.9] 06/26/2016  . Alcohol use disorder, severe, 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[place]) [F10.21] 06/26/2016  . 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 Sedative, hypnotic or anxiolytic use disorder, severe, dependence ([place]) [F13.20] 05/12/2016  . Ventricular fibrillation ([place]) [I49.01] 04/05/2016  . 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  Cervical spondylosis without myelopathy [M47.812] 07/24/2014  . Severe episode of recurrent major depressive disorder, without psychotic features ([place]) [F33.2] 04/15/2013  . Tobacco use disorder [F17.200] 10/20/2010  . Asthma [J45.909] 10/20/2010    Total Time spent with patient: 1 hour  Subjective:   Michael Schmitt is a 46 y.o. male patient admitted with "suicidal and anxious and all of that".  HPI: This is a consult for this 46 year old man well known from multiple prior encounters.  He has chronic depression and anxiety symptoms.  Presented to the emergency room saying that he was feeling worse.  He complains of suicidal ideation although on interview with me he admits that it is chronic and that he has no plan or intent of acting on it.  He denies any homicidal thoughts and denies psychosis.  He says he is still compliant with the medicines that were prescribed to him by his outpatient psychiatrist.  Patient has very little activity or support in his life.  Very passive about how he leads his life.  Says he has been irritable but has not been violent and does not have any thought of hurting anyone else.  No psychotic symptoms.  Denies that he has recently been drinking or using drugs.  No other new stressors besides his chronic pain and anxiety problems.  Sleeps poorly.  Substance abuse history: Patient has a history of misuse of benzodiazepines but does not appear to be misusing them right now although it is hard to tell.  His outpatient psychiatrist usually prescribes clonazepam for him.  Medical history: Patient has chronic musculoskeletal pain history of hyperlipidemia and asthma.  Social history: Patient does not work outside the home.  Lives with his mother and his own children.  I am not sure that he has disability although I think he may have finally gotten it.  He does  almost nothing saying he has no motivation or energy to do anything with his life.  Past Psychiatric History:  Long-standing history of similar symptoms unresponsive or minimally responsive to multiple medications.  Long history of perceived over dependence on controlled substances.  Does have a history of some self injury in the past although has not seriously attempted to kill himself any time recently.  No history of clear psychotic symptoms.  Risk to Self: Suicidal Ideation: Yes-Currently Present Suicidal Intent: No Is patient at risk for suicide?: Yes Suicidal Plan?: Yes-Currently Present Specify Current Suicidal Plan: Overdose on medication Access to Means: Yes Specify Access to Suicidal Means: Medication What has been your use of drugs/alcohol within the last 12 months?: Alcohol How many times?: 0 Other Self Harm Risks: Reports of none Triggers for Past Attempts: None known Intentional Self Injurious Behavior: None Risk to Others: Homicidal Ideation: No Thoughts of Harm to Others: No Comment - Thoughts of Harm to Others: Reports of none Current Homicidal Intent: No Current Homicidal Plan: No Access to Homicidal Means: No Identified Victim: Reports of none History of harm to others?: No Assessment of Violence: None Noted Violent Behavior Description: Reports of none Does patient have access to weapons?: No Criminal Charges Pending?: Yes Describe Pending Criminal Charges: Traffic-DRIVING WHILE IMPAIRED, Traffic-CIVIL REVOCATION DR LIC (30), (Traffic-OPEN CONT AFTER CONS ALC 1ST, Misdemeanor-INTOXICATE) Does patient have a court date: Yes Court Date: 03/16/17(05/15/2017) Prior Inpatient Therapy: Prior Inpatient Therapy: Yes Prior Therapy Dates: 2018,2017 Prior Therapy Facilty/Provider(s): [facility], [place], [place] Reason for Treatment: depression Prior Outpatient Therapy: Prior Outpatient Therapy: Yes Prior Therapy Dates: current Prior Therapy Facilty/Provider(s): [facility] Reason for Treatment: depression Does patient have an ACCT team?: No Does patient have Intensive  In-House Services?  : No Does patient have [facility] services? : No Does patient have P4CC services?: No  Past Medical History:  Past Medical History:  Diagnosis Date  . Anxiety   . Anxiety   . Arthritis    cerv. stenosis, spondylosis, HNP- lower back , has been followed in pain clinic, has  had injection s in cerv. area  . Blood dyscrasia    told that when he was young he was a" free bleeder"  . CAD (coronary artery disease)   . Cervical spondylosis without myelopathy 07/24/2014  . Cervicogenic headache 07/24/2014  . Chronic kidney disease    renal calculi- passed spontaneously  . Depression   . Diabetes mellitus without complication ([place])   . Fatty liver   . GERD (gastroesophageal reflux disease)   . Headache(784.0)   . Hyperlipidemia   . Hypertension   . Mental disorder   . MI, old   . RLS (restless legs syndrome)    detected on sleep study  . Shortness of breath   . Ventricular fibrillation ([place]) 04/05/2016    Past Surgical History:  Procedure Laterality Date  . NASAL SINUS SURGERY     2005   Family History:  Family History  Problem Relation Age of Onset  . Prostate cancer Father   . Hypertension Mother   . Kidney Stones Mother   . Anxiety disorder Mother   . Depression Mother   . COPD Sister   . Hypertension Sister   . Diabetes Sister   . Depression Sister   . Anxiety disorder Sister   . Seizures Sister   . ADD / ADHD Son   . ADD / ADHD Daughter    Family Psychiatric  History: Positive for anxiety Social History:  Social History   Substance and Sexual Activity  Alcohol Use No  . Alcohol/week: 0.0 oz   Comment: last drinked in 3 months.      Social History   Substance and Sexual Activity  Drug Use No    Social History   Socioeconomic History  . Marital status: Widowed    Spouse name: None  . Number of children: 3  . Years of education: 66  . Highest education level: None  Social Needs  . Financial resource strain: None  . Food insecurity -  worry: None  . Food insecurity - inability: None  . Transportation needs - medical: None  . Transportation needs - non-medical: None  Occupational History    Comment: unemployed  Tobacco Use  . Smoking status: Current Every Day Smoker    Packs/day: 2.00    Years: 17.00    Pack years: 34.00    Types: Cigarettes, Cigars  . Smokeless tobacco: Former Systems developer  . Tobacco comment: occassional snuff   Substance and Sexual Activity  . Alcohol use: No    Alcohol/week: 0.0 oz    Comment: last drinked in 3 months.   . Drug use: No  . Sexual activity: Not Currently  Other Topics Concern  . None  Social History Narrative   Patient is right handed.   Patient drinks 2 sodas daily.   Additional Social History:    Allergies:   Allergies  Allergen Reactions  . Prednisone Other (See Comments)    Hypertension, makes him feel spacey  . Hydrocodone Other (See Comments)    Headache, irritable  . Varenicline Other (See Comments)    Suicidal thoughts    Labs:  Results for orders placed or performed during the hospital encounter of 02/04/17 (from the past 48 hour(s))  Comprehensive metabolic panel     Status: Abnormal   Collection Time: 02/04/17  9:50 AM  Result Value Ref Range   Sodium 138 135 - 145 mmol/L   Potassium 4.0 3.5 - 5.1 mmol/L   Chloride 104 101 - 111 mmol/L   CO2 21 (L) 22 - 32 mmol/L   Glucose, Bld 179 (H) 65 - 99 mg/dL   BUN 11 6 - 20 mg/dL   Creatinine, Ser 0.92 0.61 - 1.24 mg/dL   Calcium 9.0 8.9 - 10.3 mg/dL   Total Protein 7.1 6.5 - 8.1 g/dL   Albumin 4.3 3.5 - 5.0 g/dL   AST 28 15 - 41 U/L   ALT 17 17 - 63 U/L   Alkaline Phosphatase 84 38 - 126 U/L   Total Bilirubin 0.1 (L) 0.3 - 1.2 mg/dL   GFR calc non Af Amer >60 >60 mL/min   GFR calc Af Amer >60 >60 mL/min    Comment: (NOTE) The eGFR has been calculated using the CKD EPI equation. This calculation has not been validated in all clinical situations. eGFR's persistently <60 mL/min signify possible Chronic  Kidney Disease.    Anion gap 13 5 - 15  Ethanol     Status: None   Collection Time: 02/04/17  9:50 AM  Result Value Ref Range   Alcohol, Ethyl (B) <10 <10 mg/dL    Comment:        LOWEST DETECTABLE LIMIT FOR SERUM ALCOHOL IS 10 mg/dL FOR MEDICAL PURPOSES ONLY   Salicylate level     Status: None   Collection Time: 02/04/17  9:50 AM  Result Value Ref Range   Salicylate Lvl <7.2 2.8 - 30.0 mg/dL  Acetaminophen level  Status: None   Collection Time: 02/04/17  9:50 AM  Result Value Ref Range   Acetaminophen (Tylenol), Serum 20 10 - 30 ug/mL    Comment:        THERAPEUTIC CONCENTRATIONS VARY SIGNIFICANTLY. A RANGE OF 10-30 ug/mL MAY BE AN EFFECTIVE CONCENTRATION FOR MANY PATIENTS. HOWEVER, SOME ARE BEST TREATED AT CONCENTRATIONS OUTSIDE THIS RANGE. ACETAMINOPHEN CONCENTRATIONS >150 ug/mL AT 4 HOURS AFTER INGESTION AND >50 ug/mL AT 12 HOURS AFTER INGESTION ARE OFTEN ASSOCIATED WITH TOXIC REACTIONS.   cbc     Status: Abnormal   Collection Time: 02/04/17  9:50 AM  Result Value Ref Range   WBC 5.6 3.8 - 10.6 K/uL   RBC 4.96 4.40 - 5.90 MIL/uL   Hemoglobin 15.1 13.0 - 18.0 g/dL   HCT 46.1 40.0 - 52.0 %   MCV 93.0 80.0 - 100.0 fL   MCH 30.4 26.0 - 34.0 pg   MCHC 32.7 32.0 - 36.0 g/dL   RDW 15.6 (H) 11.5 - 14.5 %   Platelets 191 150 - 440 K/uL  Urine Drug Screen, Qualitative     Status: None   Collection Time: 02/04/17 12:15 PM  Result Value Ref Range   Tricyclic, Ur Screen NONE DETECTED NONE DETECTED   Amphetamines, Ur Screen NONE DETECTED NONE DETECTED   MDMA (Ecstasy)Ur Screen NONE DETECTED NONE DETECTED   Cocaine Metabolite,Ur [place] NONE DETECTED NONE DETECTED   Opiate, Ur Screen NONE DETECTED NONE DETECTED   Phencyclidine (PCP) Ur S NONE DETECTED NONE DETECTED   Cannabinoid 50 Ng, Ur [place] NONE DETECTED NONE DETECTED   Barbiturates, Ur Screen NONE DETECTED NONE DETECTED   Benzodiazepine, Ur Scrn NONE DETECTED NONE DETECTED   Methadone Scn, Ur NONE DETECTED NONE  DETECTED    Comment: (NOTE) 794  Tricyclics, urine               Cutoff 1000 ng/mL 200  Amphetamines, urine             Cutoff 1000 ng/mL 300  MDMA (Ecstasy), urine           Cutoff 500 ng/mL 400  Cocaine Metabolite, urine       Cutoff 300 ng/mL 500  Opiate, urine                   Cutoff 300 ng/mL 600  Phencyclidine (PCP), urine      Cutoff 25 ng/mL 700  Cannabinoid, urine              Cutoff 50 ng/mL 800  Barbiturates, urine             Cutoff 200 ng/mL 900  Benzodiazepine, urine           Cutoff 200 ng/mL 1000 Methadone, urine                Cutoff 300 ng/mL 1100 1200 The urine drug screen provides only a preliminary, unconfirmed 1300 analytical test result and should not be used for non-medical 1400 purposes. Clinical consideration and professional judgment should 1500 be applied to any positive drug screen result due to possible 1600 interfering substances. A more specific alternate chemical method 1700 must be used in order to obtain a confirmed analytical result.  1800 Gas chromato graphy / mass spectrometry (GC/MS) is the preferred 1900 confirmatory method.     Current Facility-Administered Medications  Medication Dose Route Frequency Provider Last Rate Last Dose  . aspirin EC tablet 81 mg  81 mg Oral Daily Darel Hong, MD  81 mg at 02/05/17 0954  . atorvastatin (LIPITOR) tablet 80 mg  80 mg Oral q1800 Darel Hong, MD   80 mg at 02/04/17 1705  . carvedilol (COREG) tablet 6.25 mg  6.25 mg Oral BID WC Darel Hong, MD   6.25 mg at 02/05/17 0954  . clopidogrel (PLAVIX) tablet 75 mg  75 mg Oral Daily Darel Hong, MD   75 mg at 02/05/17 1026  . divalproex (DEPAKOTE) DR tablet 500 mg  500 mg Oral QHS Darel Hong, MD   500 mg at 02/04/17 2124  . gabapentin (NEURONTIN) capsule 800 mg  800 mg Oral QID Darel Hong, MD   800 mg at 02/05/17 0955  . metFORMIN (GLUCOPHAGE) tablet 500 mg  500 mg Oral BID WC Darel Hong, MD   500 mg at 02/05/17 0954  .  mirtazapine (REMERON) tablet 15 mg  15 mg Oral QHS Darel Hong, MD   15 mg at 02/04/17 2124  . OLANZapine (ZYPREXA) tablet 5 mg  5 mg Oral BID Darel Hong, MD   5 mg at 02/05/17 0955  . prazosin (MINIPRESS) capsule 2 mg  2 mg Oral QHS Darel Hong, MD   2 mg at 02/04/17 2125   Current Outpatient Medications  Medication Sig Dispense Refill  . aspirin EC 81 MG EC tablet Take 1 tablet (81 mg total) by mouth daily. 30 tablet 0  . atorvastatin (LIPITOR) 80 MG tablet Take 1 tablet (80 mg total) by mouth daily at 6 PM. 30 tablet 0  . carvedilol (COREG) 6.25 MG tablet Take 1 tablet (6.25 mg total) by mouth 2 (two) times daily with a meal. 60 tablet 0  . clopidogrel (PLAVIX) 75 MG tablet Take 1 tablet (75 mg total) by mouth daily. 30 tablet 0  . divalproex (DEPAKOTE) 500 MG DR tablet Take 500 mg by mouth 2 (two) times daily.    Marland Kitchen gabapentin (NEURONTIN) 400 MG capsule Take 2 capsules (800 mg total) by mouth 4 (four) times daily. 240 capsule 0  . hydrOXYzine (ATARAX/VISTARIL) 50 MG tablet Take 50 mg by mouth 3 (three) times daily as needed.    . metFORMIN (GLUCOPHAGE) 500 MG tablet Take 1 tablet (500 mg total) by mouth 2 (two) times daily with a meal. 60 tablet 0  . mirtazapine (REMERON) 45 MG tablet Take 1 tablet (45 mg total) by mouth at bedtime. 30 tablet 0  . nitroGLYCERIN (NITROSTAT) 0.4 MG SL tablet Place 1 tablet (0.4 mg total) under the tongue every 5 (five) minutes x 3 doses as needed. 10 tablet 0  . prazosin (MINIPRESS) 2 MG capsule Take 2 mg by mouth at bedtime.    . risperiDONE (RISPERDAL) 2 MG tablet Take 2 mg by mouth at bedtime.    Marland Kitchen venlafaxine XR (EFFEXOR-XR) 75 MG 24 hr capsule Take 3 capsules (225 mg total) daily with breakfast by mouth. 90 capsule 1    Musculoskeletal: Strength & Muscle Tone: within normal limits Gait & Station: normal Patient leans: N/A  Psychiatric Specialty Exam: Physical Exam  Nursing note and vitals reviewed. Constitutional: He appears  well-developed and well-nourished.  HENT:  Head: Normocephalic and atraumatic.  Eyes: Conjunctivae are normal. Pupils are equal, round, and reactive to light.  Neck: Normal range of motion.  Cardiovascular: Regular rhythm and normal heart sounds.  Respiratory: Effort normal and breath sounds normal. No respiratory distress.  GI: Soft.  Musculoskeletal: Normal range of motion.  Neurological: He is alert.  Skin: Skin is warm and dry.  Psychiatric: His  speech is normal and behavior is normal. His mood appears anxious. His affect is blunt. Thought content is not paranoid. Cognition and memory are normal. He expresses impulsivity. He expresses suicidal ideation. He expresses no homicidal ideation. He expresses no suicidal plans.    Review of Systems  Constitutional: Negative.   HENT: Negative.   Eyes: Negative.   Respiratory: Negative.   Cardiovascular: Negative.   Gastrointestinal: Negative.   Musculoskeletal: Positive for myalgias.  Skin: Negative.   Neurological: Negative.   Psychiatric/Behavioral: Positive for depression and suicidal ideas. Negative for hallucinations, memory loss and substance abuse. The patient is nervous/anxious and has insomnia.     Blood pressure 140/88, pulse 85, temperature 97.9 F (36.6 C), temperature source Oral, resp. rate 18, height '6\' 2"'  (1.88 m), weight 88.5 kg (195 lb), SpO2 99 %.Body mass index is 25.04 kg/m.  General Appearance: Casual  Eye Contact:  Good  Speech:  Slow  Volume:  Decreased  Mood:  Anxious and Dysphoric  Affect:  Constricted  Thought Process:  Goal Directed  Orientation:  Full (Time, Place, and Person)  Thought Content:  Logical  Suicidal Thoughts:  No  Homicidal Thoughts:  No  Memory:  Immediate;   Fair Recent;   Fair Remote;   Fair  Judgement:  Fair  Insight:  Fair  Psychomotor Activity:  Decreased  Concentration:  Concentration: Fair  Recall:  [employer] of Knowledge:  Fair  Language:  Fair  Akathisia:  No  Handed:   Right  AIMS (if indicated):     Assets:  Desire for Improvement Housing Resilience  ADL's:  Intact  Cognition:  WNL  Sleep:        Treatment Plan Summary: Daily contact with patient to assess and evaluate symptoms and progress in treatment, Medication management and Plan 46 year old man presented to the emergency room with his typical chronic symptoms of general anxiety and depression.  No evidence of psychosis.  No FH to recent self injury.  No intent to harm himself.  Patient is requesting "something" as far as a medication change that may be of help to him.  I suggested that given his testimony that Effexor was initially quite helpful at his current dose that we could increase the dose to 225 mg a day.  He agrees to that and I printed out a prescription for this.  Discontinue IVC.  Case reviewed with the ER doctor and TTS.  Patient can be discharged from the emergency room  Disposition: No evidence of imminent risk to self or others at present.   Patient does not meet criteria for psychiatric inpatient admission. Supportive therapy provided about ongoing stressors.  Alethia Berthold, MD 02/05/2017 2:37 PM

## 2017-02-05 NOTE — ED Provider Notes (Signed)
-----------------------------------------   8:29 AM on 02/05/2017 -----------------------------------------   Blood pressure 129/82, pulse 69, temperature (!) 97.5 F (36.4 C), temperature source Oral, resp. rate 20, height 6\' 2"  (1.88 m), weight 88.5 kg (195 lb), SpO2 99 %.  The patient had no acute events since last update.  Calm and cooperative at this time.  Disposition is pending Psychiatry/Behavioral Medicine team recommendations.  Awaiting bed and [facility] when one becomes available.   Loney Hering, MD 02/05/17 0830

## 2017-02-05 NOTE — ED Notes (Signed)
IVC  RESCINDED  PER  DR  CLAPACS  MD

## 2017-02-05 NOTE — ED Provider Notes (Signed)
-----------------------------------------   12:34 PM on 02/05/2017 -----------------------------------------  She was medically cleared prior to my arrival, he has been evaluated by psychiatry who is reversing IVC, patient contracts for safety and we will discharge.  Return precautions and follow-up given and understood   Schuyler Amor, MD 02/05/17 1235

## 2017-02-05 NOTE — BH Assessment (Signed)
Psychiatric consult interview is completed. Per Dr.Clapacs pt is recommended for discharge.

## 2017-03-10 ENCOUNTER — Encounter: Payer: Self-pay | Admitting: Emergency Medicine

## 2017-03-10 ENCOUNTER — Emergency Department
Admission: EM | Admit: 2017-03-10 | Discharge: 2017-03-10 | Disposition: A | Discharge status: Home / Self Care | Payer: Medicare Other | Attending: Student in an Organized Health Care Education/Training Program | Admitting: Student in an Organized Health Care Education/Training Program

## 2017-03-10 ENCOUNTER — Other Ambulatory Visit: Payer: Self-pay

## 2017-03-10 DIAGNOSIS — E119 Type 2 diabetes mellitus without complications: Secondary | ICD-10-CM | POA: Diagnosis not present

## 2017-03-10 DIAGNOSIS — J45909 Unspecified asthma, uncomplicated: Secondary | ICD-10-CM | POA: Diagnosis not present

## 2017-03-10 DIAGNOSIS — R451 Restlessness and agitation: Secondary | ICD-10-CM

## 2017-03-10 DIAGNOSIS — R109 Unspecified abdominal pain: Secondary | ICD-10-CM | POA: Insufficient documentation

## 2017-03-10 DIAGNOSIS — K219 Gastro-esophageal reflux disease without esophagitis: Secondary | ICD-10-CM | POA: Diagnosis present

## 2017-03-10 DIAGNOSIS — Z79899 Other long term (current) drug therapy: Secondary | ICD-10-CM | POA: Insufficient documentation

## 2017-03-10 DIAGNOSIS — I2510 Atherosclerotic heart disease of native coronary artery without angina pectoris: Secondary | ICD-10-CM | POA: Insufficient documentation

## 2017-03-10 DIAGNOSIS — F418 Other specified anxiety disorders: Secondary | ICD-10-CM | POA: Insufficient documentation

## 2017-03-10 DIAGNOSIS — I10 Essential (primary) hypertension: Secondary | ICD-10-CM | POA: Diagnosis not present

## 2017-03-10 DIAGNOSIS — G8929 Other chronic pain: Secondary | ICD-10-CM | POA: Diagnosis not present

## 2017-03-10 DIAGNOSIS — F331 Major depressive disorder, recurrent, moderate: Secondary | ICD-10-CM

## 2017-03-10 DIAGNOSIS — F411 Generalized anxiety disorder: Secondary | ICD-10-CM | POA: Diagnosis present

## 2017-03-10 DIAGNOSIS — Z7901 Long term (current) use of anticoagulants: Secondary | ICD-10-CM | POA: Insufficient documentation

## 2017-03-10 DIAGNOSIS — F99 Mental disorder, not otherwise specified: Secondary | ICD-10-CM | POA: Insufficient documentation

## 2017-03-10 LAB — URINALYSIS, COMPLETE (UACMP) WITH MICROSCOPIC
Bacteria, UA: NONE SEEN
Bilirubin Urine: NEGATIVE
HGB URINE DIPSTICK: NEGATIVE
Ketones, ur: 5 mg/dL — AB
Leukocytes, UA: NEGATIVE
NITRITE: NEGATIVE
PH: 5 (ref 5.0–8.0)
Protein, ur: 30 mg/dL — AB
SPECIFIC GRAVITY, URINE: 1.028 (ref 1.005–1.030)
Squamous Epithelial / LPF: NONE SEEN

## 2017-03-10 LAB — COMPREHENSIVE METABOLIC PANEL
ALBUMIN: 4.2 g/dL (ref 3.5–5.0)
ALK PHOS: 81 U/L (ref 38–126)
ALT: 14 U/L — AB (ref 17–63)
ANION GAP: 10 (ref 5–15)
AST: 22 U/L (ref 15–41)
BILIRUBIN TOTAL: 0.3 mg/dL (ref 0.3–1.2)
BUN: 12 mg/dL (ref 6–20)
CALCIUM: 9.8 mg/dL (ref 8.9–10.3)
CO2: 22 mmol/L (ref 22–32)
CREATININE: 0.91 mg/dL (ref 0.61–1.24)
Chloride: 104 mmol/L (ref 101–111)
GFR calc Af Amer: >60 mL/min (ref >60)
GFR calc non Af Amer: >60 mL/min (ref >60)
GLUCOSE: 146 mg/dL — AB (ref 65–99)
Potassium: 4 mmol/L (ref 3.5–5.1)
Sodium: 136 mmol/L (ref 135–145)
TOTAL PROTEIN: 7.3 g/dL (ref 6.5–8.1)

## 2017-03-10 LAB — CBC
HCT: 45.8 % (ref 40.0–52.0)
HEMOGLOBIN: 15.4 g/dL (ref 13.0–18.0)
MCH: 31.3 pg (ref 26.0–34.0)
MCHC: 33.6 g/dL (ref 32.0–36.0)
MCV: 93.2 fL (ref 80.0–100.0)
PLATELETS: 176 10*3/uL (ref 150–440)
RBC: 4.91 MIL/uL (ref 4.40–5.90)
RDW: 14.7 % — AB (ref 11.5–14.5)
WBC: 6.4 10*3/uL (ref 3.8–10.6)

## 2017-03-10 LAB — URINE DRUG SCREEN, QUALITATIVE (ARMC ONLY)
AMPHETAMINES, UR SCREEN: NOT DETECTED
BARBITURATES, UR SCREEN: NOT DETECTED
Benzodiazepine, Ur Scrn: NOT DETECTED
COCAINE METABOLITE, UR ~~LOC~~: NOT DETECTED
Cannabinoid 50 Ng, Ur ~~LOC~~: NOT DETECTED
MDMA (Ecstasy)Ur Screen: NOT DETECTED
METHADONE SCREEN, URINE: NOT DETECTED
OPIATE, UR SCREEN: NOT DETECTED
Phencyclidine (PCP) Ur S: NOT DETECTED
TRICYCLIC, UR SCREEN: NOT DETECTED

## 2017-03-10 LAB — ACETAMINOPHEN LEVEL: Acetaminophen (Tylenol), Serum: <10 ug/mL — ABNORMAL LOW (ref 10–30)

## 2017-03-10 LAB — LIPASE, BLOOD: Lipase: 28 U/L (ref 11–51)

## 2017-03-10 LAB — SALICYLATE LEVEL: Salicylate Lvl: <7 mg/dL (ref 2.8–30.0)

## 2017-03-10 MED ORDER — HALOPERIDOL LACTATE 5 MG/ML IJ SOLN
10.0000 mg | Freq: Once | INTRAMUSCULAR | Status: AC
  Administered 2017-03-10: 10 mg via INTRAMUSCULAR

## 2017-03-10 MED ORDER — LORAZEPAM 2 MG PO TABS
2.0000 mg | ORAL_TABLET | Freq: Once | ORAL | Status: AC
  Administered 2017-03-10: 2 mg via ORAL
  Filled 2017-03-10: qty 1

## 2017-03-10 MED ORDER — HALOPERIDOL LACTATE 5 MG/ML IJ SOLN
5.0000 mg | Freq: Once | INTRAMUSCULAR | Status: DC
  Filled 2017-03-10: qty 1

## 2017-03-10 MED ORDER — HALOPERIDOL LACTATE 5 MG/ML IJ SOLN
INTRAMUSCULAR | Status: AC
  Filled 2017-03-10: qty 1

## 2017-03-10 NOTE — ED Triage Notes (Addendum)
Pt arrived via [place] with wife. Pt reports abdominal pain x 1 week and several episodes of vomiting daily.  Pt states he has been increasing his Gabriel Earing Powder use for chronic neck pain using several packets per day.  Pt states "i'm on the verge of a nervous breakdown".  Pt also states "i'm just depressed with everything, I don't want to be here anymore"

## 2017-03-10 NOTE — ED Notes (Signed)
Patient questioned if he has done anything to hurt self. States "No, I have too much to live for" Questioned if he has thoughts of harming self, states "Yes, I dont want to live anymore."

## 2017-03-10 NOTE — ED Notes (Signed)
Pt in room yelling "why me" and being extremely loud even with door closed. Pt given ativan po but still yelling that he wants someone to "fix him". Offered im haldol and pt states as long as it "makes him sleep" he will accept it. Pt advised [name] will see him. Pt requesting to go to [facility].

## 2017-03-10 NOTE — ED Provider Notes (Signed)
[facility] Emergency Department Provider Note    First MD Initiated Contact with Patient 03/10/17 1042     (approximate)  I have reviewed the triage vital signs and the nursing notes.   HISTORY  Chief Complaint Abdominal Pain; Suicidal; and Emesis    HPI Michael Schmitt is a 46 y.o. male one in his facility with severe anxiety presents to the ER with chief complaint of abdominal pain times 1 week" feeling he is going to die, something is wrong with him ".  Patient wants help.  States he has been taking BC Goody powder for chronic neck pain over the past several days.  States he is on the verge of a nervous breakdown.  States that he wants to die.  Does not elicit any plan.  States he has not slept in 1 week..  Very very anxious and agitated on initial evaluation in the ER.  Past Medical History:  Diagnosis Date  . Anxiety   . Anxiety   . Arthritis    cerv. stenosis, spondylosis, HNP- lower back , has been followed in pain clinic, has  had injection s in cerv. area  . Blood dyscrasia    told that when he was young he was a" free bleeder"  . CAD (coronary artery disease)   . Cervical spondylosis without myelopathy 07/24/2014  . Cervicogenic headache 07/24/2014  . Chronic kidney disease    renal calculi- passed spontaneously  . Depression   . Diabetes mellitus without complication ([place])   . Fatty liver   . GERD (gastroesophageal reflux disease)   . Headache(784.0)   . Hyperlipidemia   . Hypertension   . Mental disorder   . MI, old   . RLS (restless legs syndrome)    detected on sleep study  . Shortness of breath   . Ventricular fibrillation ([place]) 04/05/2016   Family History  Problem Relation Age of Onset  . Prostate cancer Father   . Hypertension Mother   . Kidney Stones Mother   . Anxiety disorder Mother   . Depression Mother   . COPD Sister   . Hypertension Sister   . Diabetes Sister   . Depression Sister   . Anxiety disorder Sister     . Seizures Sister   . ADD / ADHD Son   . ADD / ADHD Daughter    Past Surgical History:  Procedure Laterality Date  . ANTERIOR CERVICAL DECOMP/DISCECTOMY FUSION  11/13/2011   Procedure: ANTERIOR CERVICAL DECOMPRESSION/DISCECTOMY FUSION 1 LEVEL;  Surgeon: [name], MD;  Location: [place];  Service: Neurosurgery;  Laterality: N/A;  Anterior Cervical Decompression/discectomy Fusion. Cervical three-four.  Marland Kitchen CARDIAC CATHETERIZATION N/A 01/01/2015   Procedure: Left Heart Cath and Coronary Angiography;  Surgeon: [name], MD;  Location: [place] [facility];  Service: Cardiovascular;  Laterality: N/A;  . CARDIAC CATHETERIZATION N/A 04/05/2016   Procedure: Left Heart Cath and Coronary Angiography;  Surgeon: Belva Crome, MD;  Location: [place] [facility];  Service: Cardiovascular;  Laterality: N/A;  . CARDIAC CATHETERIZATION N/A 04/05/2016   Procedure: Coronary Stent Intervention;  Surgeon: Belva Crome, MD;  Location: [facility];  Service: Cardiovascular;  Laterality: N/A;  . CARDIAC CATHETERIZATION N/A 04/05/2016   Procedure: Intravascular Ultrasound/IVUS;  Surgeon: Belva Crome, MD;  Location: [place];  Service: Cardiovascular;  Laterality: N/A;  . NASAL SINUS SURGERY     2005   Patient Active Problem List   Diagnosis Date  Noted  . Generalized anxiety disorder 02/05/2017  . Chronic neck pain (Primary Area of Pain) (Left) 11/22/2016  . Cervical facet syndrome (Left) 11/22/2016  . Cervical radiculitis (Left) 11/22/2016  . History of cervical spinal surgery 11/22/2016  . Cervical spondylosis 11/22/2016  . Chronic shoulder pain (Secondary Area of Pain) (Left) 11/22/2016  . Arthropathy of shoulder (Left) 11/22/2016  . Chronic low back pain [facility] Area of Pain) (Bilateral) (L>R) 11/22/2016  . Lumbar facet syndrome (Bilateral) (L>R) 11/22/2016  . Chronic pain syndrome 11/21/2016  . Long term prescription benzodiazepine use 11/21/2016  . Opiate use 11/21/2016  .  Uncontrolled type 2 diabetes mellitus without complication, without long-term current use of insulin ([place]) 10/19/2016  . Chronic Suicidal ideation 09/15/2016  . Coronary artery disease 09/12/2016  . MDD (major depressive disorder), severe ([place]) 09/12/2016  . HTN (hypertension) 06/26/2016  . GERD (gastroesophageal reflux disease) 06/26/2016  . Alcohol use disorder, severe, 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[place]) 06/26/2016  . Moderate benzodiazepine use disorder ([place]) 06/11/2016  . Sedative, hypnotic or anxiolytic use disorder, severe, dependence ([place]) 05/12/2016  . Ventricular fibrillation ([place]) 04/05/2016  . Combined hyperlipidemia 01/05/2016  . Cervical spondylosis without myelopathy 07/24/2014  . Severe episode of recurrent major depressive disorder, without psychotic features ([place]) 04/15/2013  . Tobacco use disorder 10/20/2010  . Asthma 10/20/2010      Prior to Admission medications   Medication Sig Start Date End Date Taking? Authorizing Provider  aspirin EC 81 MG EC tablet Take 1 tablet (81 mg total) by mouth daily. 09/15/16   Hildred Priest, MD  atorvastatin (LIPITOR) 80 MG tablet Take 1 tablet (80 mg total) by mouth daily at 6 PM. 09/14/16   Hildred Priest, MD  carvedilol (COREG) 6.25 MG tablet Take 1 tablet (6.25 mg total) by mouth 2 (two) times daily with a meal. 09/14/16   Hildred Priest, MD  clopidogrel (PLAVIX) 75 MG tablet Take 1 tablet (75 mg total) by mouth daily. 09/15/16   Hildred Priest, MD  divalproex (DEPAKOTE) 500 MG DR tablet Take 500 mg by mouth 2 (two) times daily.    [provider]  gabapentin (NEURONTIN) 400 MG capsule Take 2 capsules (800 mg total) by mouth 4 (four) times daily. 09/14/16   Hildred Priest, MD  hydrOXYzine (ATARAX/VISTARIL) 50 MG tablet Take 50 mg by mouth 3 (three) times daily as needed.    [provider]  metFORMIN (GLUCOPHAGE) 500 MG tablet Take 1 tablet (500 mg total) by mouth 2  (two) times daily with a meal. 09/14/16   Hildred Priest, MD  mirtazapine (REMERON) 45 MG tablet Take 1 tablet (45 mg total) by mouth at bedtime. 09/14/16   Hildred Priest, MD  nitroGLYCERIN (NITROSTAT) 0.4 MG SL tablet Place 1 tablet (0.4 mg total) under the tongue every 5 (five) minutes x 3 doses as needed. 09/14/16 09/14/17  Hildred Priest, MD  prazosin (MINIPRESS) 2 MG capsule Take 2 mg by mouth at bedtime.    [provider]  risperiDONE (RISPERDAL) 2 MG tablet Take 2 mg by mouth at bedtime.    [provider]  venlafaxine XR (EFFEXOR-XR) 75 MG 24 hr capsule Take 3 capsules (225 mg total) daily with breakfast by mouth. 02/05/17   Clapacs, Madie Reno, MD    Allergies Prednisone; Hydrocodone; and Varenicline    Social History Social History   Tobacco Use  . Smoking status: Current Every Day Smoker    Packs/day: 2.00    Years: 17.00    Pack years: 34.00  Types: Cigarettes, Cigars  . Smokeless tobacco: Former Systems developer  . Tobacco comment: occassional snuff   Substance Use Topics  . Alcohol use: No    Alcohol/week: 0.0 oz    Comment: last drinked in 3 months.   . Drug use: No    Review of Systems Patient denies headaches, rhinorrhea, blurry vision, numbness, shortness of breath, chest pain, edema, cough, abdominal pain, nausea, vomiting, diarrhea, dysuria, fevers, rashes or hallucinations unless otherwise stated above in HPI. ____________________________________________   PHYSICAL EXAM:  VITAL SIGNS: Vitals:   03/10/17 1022  BP: (!) 148/100  Pulse: 97  Resp: 18  Temp: 98.2 F (36.8 C)  SpO2: 97%    Constitutional: Alert, pacing back and forth in room Eyes: Conjunctivae are normal.  Head: Atraumatic. Nose: No congestion/rhinnorhea. Mouth/Throat: Mucous membranes are moist.   Neck: No stridor. Painless ROM.  Cardiovascular: Normal rate, regular rhythm. Grossly normal heart sounds.  Good peripheral  circulation. Respiratory: Normal respiratory effort.  No retractions. Lungs CTAB. Gastrointestinal: Soft and nontender. No distention. No abdominal bruits. No CVA tenderness. Genitourinary:  Musculoskeletal: No lower extremity tenderness nor edema.  No joint effusions. Neurologic:  Normal speech and language. No gross focal neurologic deficits are appreciated. No facial droop. Steady gait Skin:  Skin is warm, dry and intact. No rash noted. Psychiatric: agitated,. Pressured speech, HI without plan ____________________________________________   LABS (all labs ordered are listed, but only abnormal results are displayed)  Results for orders placed or performed during the hospital encounter of 03/10/17 (from the past 24 hour(s))  Lipase, blood     Status: None   Collection Time: 03/10/17 10:40 AM  Result Value Ref Range   Lipase 28 11 - 51 U/L  Comprehensive metabolic panel     Status: Abnormal   Collection Time: 03/10/17 10:40 AM  Result Value Ref Range   Sodium 136 135 - 145 mmol/L   Potassium 4.0 3.5 - 5.1 mmol/L   Chloride 104 101 - 111 mmol/L   CO2 22 22 - 32 mmol/L   Glucose, Bld 146 (H) 65 - 99 mg/dL   BUN 12 6 - 20 mg/dL   Creatinine, Ser 0.91 0.61 - 1.24 mg/dL   Calcium 9.8 8.9 - 10.3 mg/dL   Total Protein 7.3 6.5 - 8.1 g/dL   Albumin 4.2 3.5 - 5.0 g/dL   AST 22 15 - 41 U/L   ALT 14 (L) 17 - 63 U/L   Alkaline Phosphatase 81 38 - 126 U/L   Total Bilirubin 0.3 0.3 - 1.2 mg/dL   GFR calc non Af Amer >60 >60 mL/min   GFR calc Af Amer >60 >60 mL/min   Anion gap 10 5 - 15  CBC     Status: Abnormal   Collection Time: 03/10/17 10:40 AM  Result Value Ref Range   WBC 6.4 3.8 - 10.6 K/uL   RBC 4.91 4.40 - 5.90 MIL/uL   Hemoglobin 15.4 13.0 - 18.0 g/dL   HCT 45.8 40.0 - 52.0 %   MCV 93.2 80.0 - 100.0 fL   MCH 31.3 26.0 - 34.0 pg   MCHC 33.6 32.0 - 36.0 g/dL   RDW 14.7 (H) 11.5 - 14.5 %   Platelets 176 150 - 440 K/uL  Urinalysis, Complete w Microscopic     Status: Abnormal    Collection Time: 03/10/17 10:40 AM  Result Value Ref Range   Color, Urine YELLOW (A) YELLOW   APPearance CLEAR (A) CLEAR   Specific Gravity, Urine 1.028 1.005 -  1.030   pH 5.0 5.0 - 8.0   Glucose, UA >=500 (A) NEGATIVE mg/dL   Hgb urine dipstick NEGATIVE NEGATIVE   Bilirubin Urine NEGATIVE NEGATIVE   Ketones, ur 5 (A) NEGATIVE mg/dL   Protein, ur 30 (A) NEGATIVE mg/dL   Nitrite NEGATIVE NEGATIVE   Leukocytes, UA NEGATIVE NEGATIVE   RBC / HPF 0-5 0 - 5 RBC/hpf   WBC, UA 0-5 0 - 5 WBC/hpf   Bacteria, UA NONE SEEN NONE SEEN   Squamous Epithelial / LPF NONE SEEN NONE SEEN   Mucus PRESENT   Acetaminophen level     Status: Abnormal   Collection Time: 03/10/17 10:40 AM  Result Value Ref Range   Acetaminophen (Tylenol), Serum <10 (L) 10 - 30 ug/mL  Salicylate level     Status: None   Collection Time: 03/10/17 10:40 AM  Result Value Ref Range   Salicylate Lvl <6.3 2.8 - 30.0 mg/dL   ____________________________________________  EKG____________________________________________  RADIOLOGY   ____________________________________________   PROCEDURES  Procedure(s) performed:  .Critical Care Performed by: Merlyn Lot, MD Authorized by: Merlyn Lot, MD   Critical care provider statement:    Critical care time (minutes):  30   Critical care time was exclusive of:  Separately billable procedures and treating other patients   Critical care was necessary to treat or prevent imminent or life-threatening deterioration of the following conditions: agitation.   Critical care was time spent personally by me on the following activities:  Development of treatment plan with patient or surrogate, discussions with consultants, evaluation of patient's response to treatment, examination of patient, obtaining history from patient or surrogate, ordering and performing treatments and interventions, ordering and review of laboratory studies, ordering and review of radiographic studies, pulse  oximetry, re-evaluation of patient's condition and review of old charts      Critical Care performed: yes ____________________________________________   INITIAL IMPRESSION / [place] / ED COURSE  Pertinent labs & imaging results that were available during my care of the patient were reviewed by me and considered in my medical decision making (see chart for details).  DDX: Psychosis, delirium, medication effect, noncompliance, polysubstance abuse, Si, Hi, depression   Michael Schmitt is a 46 y.o. who presents to the ED with for evaluation of anixety, agitation and SI.  Patient has psych history of anxiety and depression.  Laboratory testing was ordered to evaluation for underlying electrolyte derangement or signs of underlying organic pathology to explain today's presentation.  There is no evidence of toxic overdose.  His abdominal exam is soft and benign.  Based on history and physical and laboratory evaluation, it appears that the patient's presentation is 2/2 underlying psychiatric disorder and will require further evaluation and management by inpatient psychiatry.  Patient was  made an IVC due to agitation, manic behavior upon arrival and SI.  Due to his agitation patient was given IM and oral calming agents for his own safety and that of staff and other surrounding patients.   Will continue to monitor. Disposition pending psychiatric evaluation.     The patient has been evaluated at bedside by [name], psychiatry.  Patient is clinically stable.  Not felt to be a danger to self or others.  No SI or Hi.  No indication for inpatient psychiatric admission at this time.  Appropriate for continued outpatient therapy.   ____________________________________________   FINAL CLINICAL IMPRESSION(S) / ED DIAGNOSES  Final diagnoses:  Agitation  Chronic abdominal pain      NEW  MEDICATIONS STARTED DURING THIS VISIT:  This SmartLink is deprecated. Use AVSMEDLIST instead to  display the medication list for a patient.   Note:  This document was prepared using Dragon voice recognition software and may include unintentional dictation errors.    Merlyn Lot, MD 03/10/17 [phone]

## 2017-03-10 NOTE — Consult Note (Signed)
[facility] Consult   Reason for Consult: Consult for 46 year old man with history of recurrent depression and anxiety Referring Physician: Quentin Cornwall Patient Identification: Michael Schmitt MRN:  [id number] Principal Diagnosis: Depression, major, recurrent, moderate ([place]) Diagnosis:   Patient Active Problem List   Diagnosis Date Noted  . Depression, major, recurrent, moderate ([place]) [F33.1] 03/10/2017  . Generalized anxiety disorder [F41.1] 02/05/2017  . Chronic neck pain (Primary Area of Pain) (Left) [M54.2, G89.29] 11/22/2016  . Cervical facet syndrome (Left) [X09.407] 11/22/2016  . Cervical radiculitis (Left) [M54.12] 11/22/2016  . History of cervical spinal surgery [Z98.890] 11/22/2016  . Cervical spondylosis [M47.22] 11/22/2016  . Chronic shoulder pain (Secondary Area of Pain) (Left) [M25.512, G89.29] 11/22/2016  . 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 Chronic low back pain [facility] Area of Pain) (Bilateral) (L>R) [M54.5, G89.29] 11/22/2016  . Lumbar facet syndrome (Bilateral) (L>R) [M47.816] 11/22/2016  . Chronic pain syndrome [G89.4] 11/21/2016  . Long term prescription benzodiazepine use [Z79.899] 11/21/2016  . Opiate use [F11.90] 11/21/2016  . Uncontrolled type 2 diabetes mellitus without complication, without long-term current use of insulin ([place]) [E11.65] 10/19/2016  . Chronic Suicidal ideation [R45.851] 09/15/2016  . Coronary artery disease [I25.10] 09/12/2016  . MDD (major depressive disorder), severe ([place]) [F32.2] 09/12/2016  . HTN (hypertension) [I10] 06/26/2016  . GERD (gastroesophageal reflux disease) [K21.9] 06/26/2016  . Alcohol use disorder, severe, 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[place]) [F10.21] 06/26/2016  . Moderate benzodiazepine use disorder ([place]) [F13.20] 06/11/2016  . Sedative, hypnotic or anxiolytic use disorder, severe, dependence ([place]) [F13.20] 05/12/2016  . Ventricular fibrillation ([place]) [I49.01] 04/05/2016  . Combined hyperlipidemia  [E78.2] 01/05/2016  . Cervical spondylosis without myelopathy [M47.812] 07/24/2014  . Severe episode of recurrent major depressive disorder, without psychotic features ([place]) [F33.2] 04/15/2013  . Tobacco use disorder [F17.200] 10/20/2010  . Asthma [J45.909] 10/20/2010    Total Time spent with patient: 1 hour  Subjective:   Michael Schmitt is a 46 y.o. male patient admitted with "it was just a misunderstanding".  HPI: Patient interviewed and chart reviewed.  Case reviewed with nursing and emergency room physician.  46 year old man with long-standing chronic depression and anxiety.  He came to the emergency room initially for complaints of abdominal pain.  During the screening questions he answered yes when they asked him if he had suicidal thoughts.  Patient on interview clarifies with me that as I will know he has chronic suicidal thoughts.  He says he has no intention or plan of acting on any of his suicidal thoughts.  He has chronic low mood frustration and anxiety which have been no different than usual recently.  His sleep patterns have been poor by his report with chronic insomnia.  He still sees his outpatient psychiatrist and is on medication but is not seeing a therapist.  Does not report hallucinations or psychotic symptoms.  Does not report any stress that is new or different than what he has had long-term.  Has to raise his children at home has no work and is frustrated with his boredom.  Social history: Patient finally has disability and yet still feels stressed out.  He lives at home with his children and some other extended family.  Medical history: Arthritis.  Chronic abdominal pain from gastric reflux  Substance abuse history: Long-standing problems with overuse and misuse especially of benzodiazepines and prescription drugs.  Some abuse of alcohol.  Right now he says he is not drinking recently and that he is only taking the clonazepam as  prescribed by his outpatient  doctor.  Past Psychiatric History: Multiple hospitalizations and multiple visits to the emergency room.  I have worked with this patient many times in the past.  Long-standing problems with depression and anxiety with only partial improvement with medication.  Patient is often very dramatic and somewhat histrionic in his presentation but does not present as psychotic.  He does not have a history of actually trying to kill himself and has always made it clear that despite his chronic suicidal thoughts he would never harm himself because of his children.  Risk to Self: Is patient at risk for suicide?: Yes Risk to Others:   Prior Inpatient Therapy:   Prior Outpatient Therapy:    Past Medical History:  Past Medical History:  Diagnosis Date  . Anxiety   . Anxiety   . Arthritis    cerv. stenosis, spondylosis, HNP- lower back , has been followed in pain clinic, has  had injection s in cerv. area  . Blood dyscrasia    told that when he was young he was a" free bleeder"  . CAD (coronary artery disease)   . Cervical spondylosis without myelopathy 07/24/2014  . Cervicogenic headache 07/24/2014  . Chronic kidney disease    renal calculi- passed spontaneously  . Depression   . Diabetes mellitus without complication ([place])   . Fatty liver   . GERD (gastroesophageal reflux disease)   . Headache(784.0)   . Hyperlipidemia   . Hypertension   . Mental disorder   . MI, old   . RLS (restless legs syndrome)    detected on sleep study  . Shortness of breath   . Ventricular fibrillation ([place]) 04/05/2016    Past Surgical History:  Procedure Laterality Date  . ANTERIOR CERVICAL DECOMP/DISCECTOMY FUSION  11/13/2011   Procedure: ANTERIOR CERVICAL DECOMPRESSION/DISCECTOMY FUSION 1 LEVEL;  Surgeon: [name], MD;  Location: [facility];  Service: Neurosurgery;  Laterality: N/A;  Anterior Cervical Decompression/discectomy Fusion. Cervical three-four.  Marland Kitchen CARDIAC CATHETERIZATION N/A 01/01/2015   Procedure:  Left Heart Cath and Coronary Angiography;  Surgeon: [name], MD;  Location: [facility];  Service: Cardiovascular;  Laterality: N/A;  . CARDIAC CATHETERIZATION N/A 04/05/2016   Procedure: Left Heart Cath and Coronary Angiography;  Surgeon: Belva Crome, MD;  Location: [facility];  Service: Cardiovascular;  Laterality: N/A;  . CARDIAC CATHETERIZATION N/A 04/05/2016   Procedure: Coronary Stent Intervention;  Surgeon: Belva Crome, MD;  Location: [facility];  Service: Cardiovascular;  Laterality: N/A;  . CARDIAC CATHETERIZATION N/A 04/05/2016   Procedure: Intravascular Ultrasound/IVUS;  Surgeon: Belva Crome, MD;  Location: [facility];  Service: Cardiovascular;  Laterality: N/A;  . NASAL SINUS SURGERY     2005   Family History:  Family History  Problem Relation Age of Onset  . Prostate cancer Father   . Hypertension Mother   . Kidney Stones Mother   . Anxiety disorder Mother   . Depression Mother   . COPD Sister   . Hypertension Sister   . Diabetes Sister   . Depression Sister   . Anxiety disorder Sister   . Seizures Sister   . ADD / ADHD Son   . ADD / ADHD Daughter    Family Psychiatric  History: Anxiety Social History:  Social History   Substance and Sexual Activity  Alcohol Use No  . Alcohol/week: 0.0 oz   Comment: last drinked in 3 months.  Social History   Substance and Sexual Activity  Drug Use No    Social History   Socioeconomic History  . Marital status: Widowed    Spouse name: None  . Number of children: 3  . Years of education: 51  . Highest education level: None  Social Needs  . Financial resource strain: None  . Food insecurity - worry: None  . Food insecurity - inability: None  . Transportation needs - medical: None  . Transportation needs - non-medical: None  Occupational History    Comment: unemployed  Tobacco Use  . Smoking status: Current Every Day Smoker    Packs/day: 2.00    Years: 17.00    Pack  years: 34.00    Types: Cigarettes, Cigars  . Smokeless tobacco: Former Systems developer  . Tobacco comment: occassional snuff   Substance and Sexual Activity  . Alcohol use: No    Alcohol/week: 0.0 oz    Comment: last drinked in 3 months.   . Drug use: No  . Sexual activity: Not Currently  Other Topics Concern  . None  Social History Narrative   Patient is right handed.   Patient drinks 2 sodas daily.   Additional Social History:    Allergies:   Allergies  Allergen Reactions  . Prednisone Other (See Comments)    Hypertension, makes him feel spacey  . Hydrocodone Other (See Comments)    Headache, irritable  . Varenicline Other (See Comments)    Suicidal thoughts    Labs:  Results for orders placed or performed during the hospital encounter of 03/10/17 (from the past 48 hour(s))  Lipase, blood     Status: None   Collection Time: 03/10/17 10:40 AM  Result Value Ref Range   Lipase 28 11 - 51 U/L  Comprehensive metabolic panel     Status: Abnormal   Collection Time: 03/10/17 10:40 AM  Result Value Ref Range   Sodium 136 135 - 145 mmol/L   Potassium 4.0 3.5 - 5.1 mmol/L   Chloride 104 101 - 111 mmol/L   CO2 22 22 - 32 mmol/L   Glucose, Bld 146 (H) 65 - 99 mg/dL   BUN 12 6 - 20 mg/dL   Creatinine, Ser 0.91 0.61 - 1.24 mg/dL   Calcium 9.8 8.9 - 10.3 mg/dL   Total Protein 7.3 6.5 - 8.1 g/dL   Albumin 4.2 3.5 - 5.0 g/dL   AST 22 15 - 41 U/L   ALT 14 (L) 17 - 63 U/L   Alkaline Phosphatase 81 38 - 126 U/L   Total Bilirubin 0.3 0.3 - 1.2 mg/dL   GFR calc non Af Amer >60 >60 mL/min   GFR calc Af Amer >60 >60 mL/min    Comment: (NOTE) The eGFR has been calculated using the CKD EPI equation. This calculation has not been validated in all clinical situations. eGFR's persistently <60 mL/min signify possible Chronic Kidney Disease.    Anion gap 10 5 - 15  CBC     Status: Abnormal   Collection Time: 03/10/17 10:40 AM  Result Value Ref Range   WBC 6.4 3.8 - 10.6 K/uL   RBC 4.91 4.40  - 5.90 MIL/uL   Hemoglobin 15.4 13.0 - 18.0 g/dL   HCT 45.8 40.0 - 52.0 %   MCV 93.2 80.0 - 100.0 fL   MCH 31.3 26.0 - 34.0 pg   MCHC 33.6 32.0 - 36.0 g/dL   RDW 14.7 (H) 11.5 - 14.5 %   Platelets 176 150 -  440 K/uL  Urinalysis, Complete w Microscopic     Status: Abnormal   Collection Time: 03/10/17 10:40 AM  Result Value Ref Range   Color, Urine YELLOW (A) YELLOW   APPearance CLEAR (A) CLEAR   Specific Gravity, Urine 1.028 1.005 - 1.030   pH 5.0 5.0 - 8.0   Glucose, UA >=500 (A) NEGATIVE mg/dL   Hgb urine dipstick NEGATIVE NEGATIVE   Bilirubin Urine NEGATIVE NEGATIVE   Ketones, ur 5 (A) NEGATIVE mg/dL   Protein, ur 30 (A) NEGATIVE mg/dL   Nitrite NEGATIVE NEGATIVE   Leukocytes, UA NEGATIVE NEGATIVE   RBC / HPF 0-5 0 - 5 RBC/hpf   WBC, UA 0-5 0 - 5 WBC/hpf   Bacteria, UA NONE SEEN NONE SEEN   Squamous Epithelial / LPF NONE SEEN NONE SEEN   Mucus PRESENT   Acetaminophen level     Status: Abnormal   Collection Time: 03/10/17 10:40 AM  Result Value Ref Range   Acetaminophen (Tylenol), Serum <10 (L) 10 - 30 ug/mL    Comment:        THERAPEUTIC CONCENTRATIONS VARY SIGNIFICANTLY. A RANGE OF 10-30 ug/mL MAY BE AN EFFECTIVE CONCENTRATION FOR MANY PATIENTS. HOWEVER, SOME ARE BEST TREATED AT CONCENTRATIONS OUTSIDE THIS RANGE. ACETAMINOPHEN CONCENTRATIONS >150 ug/mL AT 4 HOURS AFTER INGESTION AND >50 ug/mL AT 12 HOURS AFTER INGESTION ARE OFTEN ASSOCIATED WITH TOXIC REACTIONS.   Salicylate level     Status: None   Collection Time: 03/10/17 10:40 AM  Result Value Ref Range   Salicylate Lvl <8.1 2.8 - 30.0 mg/dL  Urine Drug Screen, Qualitative ([facility] only)     Status: None   Collection Time: 03/10/17 10:43 AM  Result Value Ref Range   Tricyclic, Ur Screen NONE DETECTED NONE DETECTED   Amphetamines, Ur Screen NONE DETECTED NONE DETECTED   MDMA (Ecstasy)Ur Screen NONE DETECTED NONE DETECTED   Cocaine Metabolite,Ur [place] NONE DETECTED NONE DETECTED   Opiate, Ur Screen NONE  DETECTED NONE DETECTED   Phencyclidine (PCP) Ur S NONE DETECTED NONE DETECTED   Cannabinoid 50 Ng, Ur [place] NONE DETECTED NONE DETECTED   Barbiturates, Ur Screen NONE DETECTED NONE DETECTED   Benzodiazepine, Ur Scrn NONE DETECTED NONE DETECTED   Methadone Scn, Ur NONE DETECTED NONE DETECTED    Comment: (NOTE) 448  Tricyclics, urine               Cutoff 1000 ng/mL 200  Amphetamines, urine             Cutoff 1000 ng/mL 300  MDMA (Ecstasy), urine           Cutoff 500 ng/mL 400  Cocaine Metabolite, urine       Cutoff 300 ng/mL 500  Opiate, urine                   Cutoff 300 ng/mL 600  Phencyclidine (PCP), urine      Cutoff 25 ng/mL 700  Cannabinoid, urine              Cutoff 50 ng/mL 800  Barbiturates, urine             Cutoff 200 ng/mL 900  Benzodiazepine, urine           Cutoff 200 ng/mL 1000 Methadone, urine                Cutoff 300 ng/mL 1100 1200 The urine drug screen provides only a preliminary, unconfirmed 1300 analytical test result and should not be used for  non-medical 1400 purposes. Clinical consideration and professional judgment should 1500 be applied to any positive drug screen result due to possible 1600 interfering substances. A more specific alternate chemical method 1700 must be used in order to obtain a confirmed analytical result.  1800 Gas chromato graphy / mass spectrometry (GC/MS) is the preferred 1900 confirmatory method.     Current Facility-Administered Medications  Medication Dose Route Frequency Provider Last Rate Last Dose  . haloperidol lactate (HALDOL) 5 MG/ML injection            Current Outpatient Medications  Medication Sig Dispense Refill  . aspirin EC 81 MG EC tablet Take 1 tablet (81 mg total) by mouth daily. 30 tablet 0  . atorvastatin (LIPITOR) 80 MG tablet Take 1 tablet (80 mg total) by mouth daily at 6 PM. 30 tablet 0  . carvedilol (COREG) 6.25 MG tablet Take 1 tablet (6.25 mg total) by mouth 2 (two) times daily with a meal. 60 tablet 0  .  clopidogrel (PLAVIX) 75 MG tablet Take 1 tablet (75 mg total) by mouth daily. 30 tablet 0  . divalproex (DEPAKOTE) 500 MG DR tablet Take 500 mg by mouth 2 (two) times daily.    Marland Kitchen gabapentin (NEURONTIN) 400 MG capsule Take 2 capsules (800 mg total) by mouth 4 (four) times daily. 240 capsule 0  . hydrOXYzine (ATARAX/VISTARIL) 50 MG tablet Take 50 mg by mouth 3 (three) times daily as needed.    . metFORMIN (GLUCOPHAGE) 500 MG tablet Take 1 tablet (500 mg total) by mouth 2 (two) times daily with a meal. 60 tablet 0  . mirtazapine (REMERON) 45 MG tablet Take 1 tablet (45 mg total) by mouth at bedtime. 30 tablet 0  . nitroGLYCERIN (NITROSTAT) 0.4 MG SL tablet Place 1 tablet (0.4 mg total) under the tongue every 5 (five) minutes x 3 doses as needed. 10 tablet 0  . prazosin (MINIPRESS) 2 MG capsule Take 2 mg by mouth at bedtime.    . risperiDONE (RISPERDAL) 2 MG tablet Take 2 mg by mouth at bedtime.    Marland Kitchen venlafaxine XR (EFFEXOR-XR) 75 MG 24 hr capsule Take 3 capsules (225 mg total) daily with breakfast by mouth. 90 capsule 1    Musculoskeletal: Strength & Muscle Tone: within normal limits Gait & Station: normal Patient leans: N/A  Psychiatric Specialty Exam: Physical Exam  Nursing note and vitals reviewed. Constitutional: He appears well-developed and well-nourished.  HENT:  Head: Normocephalic and atraumatic.  Eyes: Conjunctivae are normal. Pupils are equal, round, and reactive to light.  Neck: Normal range of motion.  Cardiovascular: Regular rhythm and normal heart sounds.  Respiratory: Effort normal. No respiratory distress.  GI: Soft.  Musculoskeletal: Normal range of motion.  Neurological: He is alert.  Skin: Skin is warm and dry.  Psychiatric: His speech is normal and behavior is normal. Judgment normal. His mood appears anxious. Cognition and memory are normal. He expresses suicidal ideation. He expresses no suicidal plans.    Review of Systems  Constitutional: Negative.   HENT:  Negative.   Eyes: Negative.   Respiratory: Negative.   Cardiovascular: Negative.   Gastrointestinal: Positive for abdominal pain.  Musculoskeletal: Negative.   Skin: Negative.   Neurological: Negative.   Psychiatric/Behavioral: Positive for depression and suicidal ideas. Negative for hallucinations, memory loss and substance abuse. The patient is nervous/anxious and has insomnia.     Blood pressure (!) 148/100, pulse 97, temperature 98.2 F (36.8 C), temperature source Oral, resp. rate 18, height '6\' 2"'  (1.88  m), weight 88.5 kg (195 lb), SpO2 97 %.Body mass index is 25.04 kg/m.  General Appearance: Fairly Groomed  Eye Contact:  Fair  Speech:  Clear and Coherent  Volume:  Decreased  Mood:  Anxious  Affect:  Appropriate  Thought Process:  Goal Directed  Orientation:  Full (Time, Place, and Person)  Thought Content:  Logical  Suicidal Thoughts:  Yes.  without intent/plan  Homicidal Thoughts:  No  Memory:  Immediate;   Fair Recent;   Fair Remote;   Fair  Judgement:  Fair  Insight:  Fair  Psychomotor Activity:  Decreased  Concentration:  Concentration: Fair  Recall:  [employer] of Knowledge:  Fair  Language:  Fair  Akathisia:  No  Handed:  Right  AIMS (if indicated):     Assets:  Desire for Improvement Housing  ADL's:  Intact  Cognition:  WNL  Sleep:        Treatment Plan Summary: Plan Reviewed situation with patient.  He does appear to be at his usual baseline mental state and does not represent an elevated risk of suicide.  Not likely to benefit from inpatient hospitalization.  I reviewed his labs and we do not see any evidence of acute substance abuse.  Spent some time counseling him about how important it would be to see a therapist and also for him to work on trying to get more activity into his life.  We have had the same conversation several times before.  Discontinue IV C.  Case reviewed with emergency room physician.  Patient can be released from the emergency room  once medically cleared.  Disposition: Patient does not meet criteria for psychiatric inpatient admission. Supportive therapy provided about ongoing stressors.  Alethia Berthold, MD 03/10/2017 2:14 PM

## 2017-03-12 ENCOUNTER — Inpatient Hospital Stay
Admission: AD | Admit: 2017-03-12 | Discharge: 2017-04-02 | DRG: 885 | Disposition: A | Discharge status: Home / Self Care | Payer: Medicare Other | Attending: Psychiatry | Admitting: Psychiatry

## 2017-03-12 ENCOUNTER — Emergency Department
Admission: EM | Admit: 2017-03-12 | Discharge: 2017-03-12 | Disposition: A | Discharge status: Other Acute Inpatient Hospital | Payer: Medicare Other | Attending: Emergency Medicine | Admitting: Emergency Medicine

## 2017-03-12 ENCOUNTER — Other Ambulatory Visit: Payer: Self-pay

## 2017-03-12 DIAGNOSIS — R569 Unspecified convulsions: Secondary | ICD-10-CM | POA: Diagnosis present

## 2017-03-12 DIAGNOSIS — Z79899 Other long term (current) drug therapy: Secondary | ICD-10-CM

## 2017-03-12 DIAGNOSIS — F17210 Nicotine dependence, cigarettes, uncomplicated: Secondary | ICD-10-CM | POA: Diagnosis not present

## 2017-03-12 DIAGNOSIS — F411 Generalized anxiety disorder: Secondary | ICD-10-CM | POA: Diagnosis present

## 2017-03-12 DIAGNOSIS — F329 Major depressive disorder, single episode, unspecified: Secondary | ICD-10-CM | POA: Diagnosis present

## 2017-03-12 DIAGNOSIS — Z7984 Long term (current) use of oral hypoglycemic drugs: Secondary | ICD-10-CM | POA: Insufficient documentation

## 2017-03-12 DIAGNOSIS — M542 Cervicalgia: Secondary | ICD-10-CM | POA: Diagnosis present

## 2017-03-12 DIAGNOSIS — G2581 Restless legs syndrome: Secondary | ICD-10-CM | POA: Diagnosis present

## 2017-03-12 DIAGNOSIS — I129 Hypertensive chronic kidney disease with stage 1 through stage 4 chronic kidney disease, or unspecified chronic kidney disease: Secondary | ICD-10-CM | POA: Diagnosis not present

## 2017-03-12 DIAGNOSIS — E782 Mixed hyperlipidemia: Secondary | ICD-10-CM | POA: Diagnosis present

## 2017-03-12 DIAGNOSIS — M545 Low back pain: Secondary | ICD-10-CM | POA: Diagnosis present

## 2017-03-12 DIAGNOSIS — M47816 Spondylosis without myelopathy or radiculopathy, lumbar region: Secondary | ICD-10-CM | POA: Diagnosis present

## 2017-03-12 DIAGNOSIS — R918 Other nonspecific abnormal finding of lung field: Secondary | ICD-10-CM | POA: Diagnosis not present

## 2017-03-12 DIAGNOSIS — F17290 Nicotine dependence, other tobacco product, uncomplicated: Secondary | ICD-10-CM | POA: Diagnosis present

## 2017-03-12 DIAGNOSIS — F1020 Alcohol dependence, uncomplicated: Secondary | ICD-10-CM | POA: Diagnosis present

## 2017-03-12 DIAGNOSIS — I25119 Atherosclerotic heart disease of native coronary artery with unspecified angina pectoris: Secondary | ICD-10-CM | POA: Diagnosis present

## 2017-03-12 DIAGNOSIS — M4722 Other spondylosis with radiculopathy, cervical region: Secondary | ICD-10-CM | POA: Diagnosis present

## 2017-03-12 DIAGNOSIS — R45851 Suicidal ideations: Secondary | ICD-10-CM | POA: Diagnosis present

## 2017-03-12 DIAGNOSIS — R451 Restlessness and agitation: Secondary | ICD-10-CM | POA: Diagnosis not present

## 2017-03-12 DIAGNOSIS — E1122 Type 2 diabetes mellitus with diabetic chronic kidney disease: Secondary | ICD-10-CM | POA: Diagnosis present

## 2017-03-12 DIAGNOSIS — Z981 Arthrodesis status: Secondary | ICD-10-CM | POA: Diagnosis not present

## 2017-03-12 DIAGNOSIS — Z818 Family history of other mental and behavioral disorders: Secondary | ICD-10-CM

## 2017-03-12 DIAGNOSIS — Z23 Encounter for immunization: Secondary | ICD-10-CM | POA: Diagnosis not present

## 2017-03-12 DIAGNOSIS — F419 Anxiety disorder, unspecified: Secondary | ICD-10-CM

## 2017-03-12 DIAGNOSIS — I2510 Atherosclerotic heart disease of native coronary artery without angina pectoris: Secondary | ICD-10-CM | POA: Diagnosis not present

## 2017-03-12 DIAGNOSIS — F332 Major depressive disorder, recurrent severe without psychotic features: Secondary | ICD-10-CM | POA: Diagnosis present

## 2017-03-12 DIAGNOSIS — Z7982 Long term (current) use of aspirin: Secondary | ICD-10-CM | POA: Diagnosis not present

## 2017-03-12 DIAGNOSIS — Z653 Problems related to other legal circumstances: Secondary | ICD-10-CM

## 2017-03-12 DIAGNOSIS — J45909 Unspecified asthma, uncomplicated: Secondary | ICD-10-CM | POA: Insufficient documentation

## 2017-03-12 DIAGNOSIS — I252 Old myocardial infarction: Secondary | ICD-10-CM | POA: Insufficient documentation

## 2017-03-12 DIAGNOSIS — Z833 Family history of diabetes mellitus: Secondary | ICD-10-CM

## 2017-03-12 DIAGNOSIS — F1010 Alcohol abuse, uncomplicated: Secondary | ICD-10-CM

## 2017-03-12 DIAGNOSIS — F17200 Nicotine dependence, unspecified, uncomplicated: Secondary | ICD-10-CM | POA: Diagnosis not present

## 2017-03-12 DIAGNOSIS — Z7902 Long term (current) use of antithrombotics/antiplatelets: Secondary | ICD-10-CM

## 2017-03-12 DIAGNOSIS — Z8249 Family history of ischemic heart disease and other diseases of the circulatory system: Secondary | ICD-10-CM

## 2017-03-12 DIAGNOSIS — Z915 Personal history of self-harm: Secondary | ICD-10-CM | POA: Diagnosis not present

## 2017-03-12 DIAGNOSIS — F1021 Alcohol dependence, in remission: Secondary | ICD-10-CM

## 2017-03-12 DIAGNOSIS — Z01810 Encounter for preprocedural cardiovascular examination: Secondary | ICD-10-CM | POA: Diagnosis not present

## 2017-03-12 DIAGNOSIS — G4700 Insomnia, unspecified: Secondary | ICD-10-CM | POA: Diagnosis present

## 2017-03-12 DIAGNOSIS — Z87442 Personal history of urinary calculi: Secondary | ICD-10-CM | POA: Diagnosis not present

## 2017-03-12 DIAGNOSIS — G894 Chronic pain syndrome: Secondary | ICD-10-CM | POA: Diagnosis present

## 2017-03-12 DIAGNOSIS — N189 Chronic kidney disease, unspecified: Secondary | ICD-10-CM | POA: Insufficient documentation

## 2017-03-12 DIAGNOSIS — Z888 Allergy status to other drugs, medicaments and biological substances status: Secondary | ICD-10-CM

## 2017-03-12 DIAGNOSIS — R200 Anesthesia of skin: Secondary | ICD-10-CM

## 2017-03-12 DIAGNOSIS — Z885 Allergy status to narcotic agent status: Secondary | ICD-10-CM

## 2017-03-12 LAB — CBC
HCT: 49.6 % (ref 40.0–52.0)
Hemoglobin: 16.5 g/dL (ref 13.0–18.0)
MCH: 31.2 pg (ref 26.0–34.0)
MCHC: 33.3 g/dL (ref 32.0–36.0)
MCV: 93.5 fL (ref 80.0–100.0)
Platelets: 257 K/uL (ref 150–440)
RBC: 5.3 MIL/uL (ref 4.40–5.90)
RDW: 14.5 % (ref 11.5–14.5)
WBC: 11.7 K/uL — ABNORMAL HIGH (ref 3.8–10.6)

## 2017-03-12 LAB — URINE DRUG SCREEN, QUALITATIVE (ARMC ONLY)
Amphetamines, Ur Screen: NOT DETECTED
BARBITURATES, UR SCREEN: NOT DETECTED
BENZODIAZEPINE, UR SCRN: NOT DETECTED
CANNABINOID 50 NG, UR ~~LOC~~: NOT DETECTED
COCAINE METABOLITE, UR ~~LOC~~: NOT DETECTED
MDMA (Ecstasy)Ur Screen: NOT DETECTED
Methadone Scn, Ur: NOT DETECTED
OPIATE, UR SCREEN: NOT DETECTED
PHENCYCLIDINE (PCP) UR S: NOT DETECTED
Tricyclic, Ur Screen: NOT DETECTED

## 2017-03-12 LAB — COMPREHENSIVE METABOLIC PANEL WITH GFR
ALT: 18 U/L (ref 17–63)
AST: 31 U/L (ref 15–41)
Albumin: 4.5 g/dL (ref 3.5–5.0)
Alkaline Phosphatase: 96 U/L (ref 38–126)
Anion gap: 15 (ref 5–15)
BUN: 8 mg/dL (ref 6–20)
CO2: 19 mmol/L — ABNORMAL LOW (ref 22–32)
Calcium: 9.5 mg/dL (ref 8.9–10.3)
Chloride: 102 mmol/L (ref 101–111)
Creatinine, Ser: 1.07 mg/dL (ref 0.61–1.24)
GFR calc Af Amer: >60 mL/min
GFR calc non Af Amer: >60 mL/min
Glucose, Bld: 110 mg/dL — ABNORMAL HIGH (ref 65–99)
Potassium: 4 mmol/L (ref 3.5–5.1)
Sodium: 136 mmol/L (ref 135–145)
Total Bilirubin: 0.5 mg/dL (ref 0.3–1.2)
Total Protein: 7.7 g/dL (ref 6.5–8.1)

## 2017-03-12 LAB — GLUCOSE, CAPILLARY: Glucose-Capillary: 103 mg/dL — ABNORMAL HIGH (ref 65–99)

## 2017-03-12 LAB — ETHANOL: Alcohol, Ethyl (B): 37 mg/dL — ABNORMAL HIGH (ref <10)

## 2017-03-12 MED ORDER — VENLAFAXINE HCL ER 75 MG PO CP24: 225.0000 mg | ORAL_CAPSULE | Freq: Every day | ORAL | Status: DC

## 2017-03-12 MED ORDER — MAGNESIUM HYDROXIDE 400 MG/5ML PO SUSP: 30.0000 mL | Freq: Every day | ORAL | Status: DC | PRN

## 2017-03-12 MED ORDER — ATORVASTATIN CALCIUM 20 MG PO TABS
80.0000 mg | ORAL_TABLET | Freq: Every day | ORAL | Status: DC
  Administered 2017-03-12: 80 mg via ORAL
  Filled 2017-03-12: qty 4

## 2017-03-12 MED ORDER — ALUM & MAG HYDROXIDE-SIMETH 200-200-20 MG/5ML PO SUSP: 30.0000 mL | ORAL | Status: DC | PRN

## 2017-03-12 MED ORDER — CLOPIDOGREL BISULFATE 75 MG PO TABS
75.0000 mg | ORAL_TABLET | Freq: Every day | ORAL | Status: DC
  Administered 2017-03-13 – 2017-04-02 (×21): 75 mg via ORAL
  Filled 2017-03-12 (×22): qty 1

## 2017-03-12 MED ORDER — CARVEDILOL 6.25 MG PO TABS
6.2500 mg | ORAL_TABLET | Freq: Two times a day (BID) | ORAL | Status: DC
  Administered 2017-03-12: 6.25 mg via ORAL
  Filled 2017-03-12: qty 1

## 2017-03-12 MED ORDER — ASPIRIN EC 81 MG PO TBEC
81.0000 mg | DELAYED_RELEASE_TABLET | Freq: Every day | ORAL | Status: DC
  Administered 2017-03-12: 81 mg via ORAL
  Filled 2017-03-12: qty 1

## 2017-03-12 MED ORDER — CLOPIDOGREL BISULFATE 75 MG PO TABS
75.0000 mg | ORAL_TABLET | Freq: Every day | ORAL | Status: DC
  Administered 2017-03-12: 75 mg via ORAL
  Filled 2017-03-12: qty 1

## 2017-03-12 MED ORDER — TRAZODONE HCL 100 MG PO TABS
100.0000 mg | ORAL_TABLET | Freq: Every evening | ORAL | Status: DC | PRN
  Administered 2017-03-12 – 2017-03-17 (×2): 100 mg via ORAL
  Filled 2017-03-12 (×3): qty 1

## 2017-03-12 MED ORDER — VENLAFAXINE HCL ER 75 MG PO CP24
225.0000 mg | ORAL_CAPSULE | Freq: Every day | ORAL | Status: DC
  Administered 2017-03-13 – 2017-03-15 (×3): 225 mg via ORAL
  Filled 2017-03-12 (×3): qty 3

## 2017-03-12 MED ORDER — DIPHENHYDRAMINE HCL 50 MG/ML IJ SOLN: 50.0000 mg | Freq: Once | INTRAMUSCULAR | Status: DC

## 2017-03-12 MED ORDER — IBUPROFEN 600 MG PO TABS
ORAL_TABLET | ORAL | Status: AC
  Filled 2017-03-12: qty 1

## 2017-03-12 MED ORDER — DIVALPROEX SODIUM 500 MG PO DR TAB
1500.0000 mg | DELAYED_RELEASE_TABLET | Freq: Every day | ORAL | Status: DC
  Administered 2017-03-12 – 2017-03-21 (×10): 1500 mg via ORAL
  Filled 2017-03-12 (×11): qty 3

## 2017-03-12 MED ORDER — PRAZOSIN HCL 2 MG PO CAPS
2.0000 mg | ORAL_CAPSULE | Freq: Every day | ORAL | Status: DC
  Filled 2017-03-12: qty 1

## 2017-03-12 MED ORDER — PRAZOSIN HCL 2 MG PO CAPS
2.0000 mg | ORAL_CAPSULE | Freq: Every day | ORAL | Status: DC
  Administered 2017-03-12 – 2017-04-01 (×21): 2 mg via ORAL
  Filled 2017-03-12 (×22): qty 1

## 2017-03-12 MED ORDER — DIVALPROEX SODIUM 500 MG PO DR TAB: 1500.0000 mg | DELAYED_RELEASE_TABLET | Freq: Every day | ORAL | Status: DC

## 2017-03-12 MED ORDER — METFORMIN HCL 500 MG PO TABS
500.0000 mg | ORAL_TABLET | Freq: Two times a day (BID) | ORAL | Status: DC
  Administered 2017-03-13 – 2017-04-02 (×41): 500 mg via ORAL
  Filled 2017-03-12 (×43): qty 1

## 2017-03-12 MED ORDER — ACETAMINOPHEN 325 MG PO TABS
650.0000 mg | ORAL_TABLET | Freq: Four times a day (QID) | ORAL | Status: DC | PRN
  Administered 2017-03-12 – 2017-03-31 (×12): 650 mg via ORAL
  Filled 2017-03-12 (×13): qty 2

## 2017-03-12 MED ORDER — ATORVASTATIN CALCIUM 20 MG PO TABS
80.0000 mg | ORAL_TABLET | Freq: Every day | ORAL | Status: DC
  Administered 2017-03-13 – 2017-04-01 (×19): 80 mg via ORAL
  Filled 2017-03-12 (×18): qty 4

## 2017-03-12 MED ORDER — ZIPRASIDONE MESYLATE 20 MG IM SOLR
20.0000 mg | Freq: Once | INTRAMUSCULAR | Status: AC
  Administered 2017-03-12: 20 mg via INTRAMUSCULAR
  Filled 2017-03-12: qty 20

## 2017-03-12 MED ORDER — ASPIRIN EC 81 MG PO TBEC
81.0000 mg | DELAYED_RELEASE_TABLET | Freq: Every day | ORAL | Status: DC
  Administered 2017-03-13 – 2017-04-02 (×21): 81 mg via ORAL
  Filled 2017-03-12 (×21): qty 1

## 2017-03-12 MED ORDER — CARVEDILOL 6.25 MG PO TABS
6.2500 mg | ORAL_TABLET | Freq: Two times a day (BID) | ORAL | Status: DC
  Administered 2017-03-13 – 2017-04-02 (×41): 6.25 mg via ORAL
  Filled 2017-03-12 (×42): qty 1

## 2017-03-12 MED ORDER — METFORMIN HCL 500 MG PO TABS
500.0000 mg | ORAL_TABLET | Freq: Two times a day (BID) | ORAL | Status: DC
  Administered 2017-03-12: 500 mg via ORAL
  Filled 2017-03-12: qty 1

## 2017-03-12 MED ORDER — DIPHENHYDRAMINE HCL 50 MG/ML IJ SOLN
50.0000 mg | Freq: Once | INTRAMUSCULAR | Status: AC
  Administered 2017-03-12: 50 mg via INTRAMUSCULAR
  Filled 2017-03-12: qty 1

## 2017-03-12 MED ORDER — MIRTAZAPINE 15 MG PO TABS
45.0000 mg | ORAL_TABLET | Freq: Every day | ORAL | Status: DC
  Administered 2017-03-12 – 2017-03-14 (×3): 45 mg via ORAL
  Filled 2017-03-12 (×3): qty 3

## 2017-03-12 MED ORDER — HYDROXYZINE HCL 25 MG PO TABS: 25.0000 mg | ORAL_TABLET | Freq: Three times a day (TID) | ORAL | Status: DC | PRN

## 2017-03-12 MED ORDER — IBUPROFEN 600 MG PO TABS
600.0000 mg | ORAL_TABLET | Freq: Once | ORAL | Status: AC
  Administered 2017-03-12: 600 mg via ORAL

## 2017-03-12 MED ORDER — MIRTAZAPINE 15 MG PO TABS: 45.0000 mg | ORAL_TABLET | Freq: Every day | ORAL | Status: DC

## 2017-03-12 NOTE — ED Notes (Signed)
BEHAVIORAL HEALTH ROUNDING Patient sleeping: No. Patient alert and oriented: yes Behavior appropriate: Yes.  ; If no, describe:  Nutrition and fluids offered: yes Toileting and hygiene offered: Yes  Sitter present: q15 minute observations and security  monitoring Law enforcement present: Yes  ODS  

## 2017-03-12 NOTE — ED Notes (Signed)
Pt complaint with medications and VS. Complaints of pain 9/10 in neck. RN to notify EDP. Maintained on 15 minute checks and observation by security camera for safety.

## 2017-03-12 NOTE — BH Assessment (Signed)
Assessment Note  Michael Schmitt is an 46 y.o. male. Michael Schmitt presented to [facility]- ED endorsing depression characterized by suicidal ideation. Patient endorses a history of Bipolar Disorder, Anxiety, and a suicidal ideation. Patient stated he drinks heavily and has not taken his medication. Furthermore, patient stated he is also homicidal and "I will hurt anyone who gets in my way." Michael Schmitt endorses self-neglect and lying in bed all day. Michael Schmitt has had several hospitalizations this year due to depression.   Diagnosis: Depression  Past Medical History:  Past Medical History:  Diagnosis Date  . Anxiety   . Anxiety   . Arthritis    cerv. stenosis, spondylosis, HNP- lower back , has been followed in pain clinic, has  had injection s in cerv. area  . Blood dyscrasia    told that when he was young he was a" free bleeder"  . CAD (coronary artery disease)   . Cervical spondylosis without myelopathy 07/24/2014  . Cervicogenic headache 07/24/2014  . Chronic kidney disease    renal calculi- passed spontaneously  . Depression   . Diabetes mellitus without complication ([place])   . Fatty liver   . GERD (gastroesophageal reflux disease)   . Headache(784.0)   . Hyperlipidemia   . Hypertension   . Mental disorder   . MI, old   . RLS (restless legs syndrome)    detected on sleep study  . Shortness of breath   . Ventricular fibrillation ([place]) 04/05/2016    Past Surgical History:  Procedure Laterality Date  . ANTERIOR CERVICAL DECOMP/DISCECTOMY FUSION  11/13/2011   Procedure: ANTERIOR CERVICAL DECOMPRESSION/DISCECTOMY FUSION 1 LEVEL;  Surgeon: [name], MD;  Location: [place] [facility];  Service: Neurosurgery;  Laterality: N/A;  Anterior Cervical Decompression/discectomy Fusion. Cervical three-four.  Marland Kitchen CARDIAC CATHETERIZATION N/A 01/01/2015   Procedure: Left Heart Cath and Coronary Angiography;  Surgeon: [name], MD;  Location: [place] CV [facility];  Service: Cardiovascular;  Laterality: N/A;  .  CARDIAC CATHETERIZATION N/A 04/05/2016   Procedure: Left Heart Cath and Coronary Angiography;  Surgeon: Belva Crome, MD;  Location: [place] CV LAB;  Service: Cardiovascular;  Laterality: N/A;  . CARDIAC CATHETERIZATION N/A 04/05/2016   Procedure: Coronary Stent Intervention;  Surgeon: Belva Crome, MD;  Location: [place] CV LAB;  Service: Cardiovascular;  Laterality: N/A;  . CARDIAC CATHETERIZATION N/A 04/05/2016   Procedure: Intravascular Ultrasound/IVUS;  Surgeon: Belva Crome, MD;  Location: [place] CV LAB;  Service: Cardiovascular;  Laterality: N/A;  . NASAL SINUS SURGERY     2005    Family History:  Family History  Problem Relation Age of Onset  . Prostate cancer Father   . Hypertension Mother   . Kidney Stones Mother   . Anxiety disorder Mother   . Depression Mother   . COPD Sister   . Hypertension Sister   . Diabetes Sister   . Depression Sister   . Anxiety disorder Sister   . Seizures Sister   . ADD / ADHD Son   . ADD / ADHD Daughter     Social History:  reports that he has been smoking cigarettes and cigars.  He has a 34.00 pack-year smoking history. He has quit using smokeless tobacco. He reports that he does not drink alcohol or use drugs.  Additional Social History:  Alcohol / Drug Use Pain Medications: SEE PTA  Prescriptions: SEE PTA  Over the Counter: SEE PTA  History of alcohol / drug use?: Yes Longest period of sobriety (when/how  long): 2 months  Negative Consequences of Use: Personal relationships, Scientist, research (physical sciences), Financial, Work / School Withdrawal Symptoms: Agitation, Irritability Substance #1 Name of Substance 1: Alcohol 1 - Age of First Use: 46 years old  1 - Amount (size/oz): 6-12 pack of beer  1 - Frequency: daily  1 - Duration: 26 years  1 - Last Use / Amount: this morning   CIWA: CIWA-Ar BP: (!) 133/93 Pulse Rate: 83 Nausea and Vomiting: no nausea and no vomiting Tactile Disturbances: none Tremor: not visible, but can be felt fingertip  to fingertip Auditory Disturbances: not present Paroxysmal Sweats: barely perceptible sweating, palms moist Visual Disturbances: very mild sensitivity Anxiety: mildly anxious Headache, Fullness in Head: mild Agitation: somewhat more than normal activity Orientation and Clouding of Sensorium: oriented and can do serial additions CIWA-Ar Total: 7 COWS:    Allergies:  Allergies  Allergen Reactions  . Prednisone Other (See Comments)    Hypertension, makes him feel spacey  . Hydrocodone Other (See Comments)    Headache, irritable  . Varenicline Other (See Comments)    Suicidal thoughts    Home Medications:  (Not in a hospital admission)  OB/GYN Status:  No LMP for male patient.  General Assessment Data Assessment unable to be completed: (Assessment completed ) Location of Assessment: [facility] ED TTS Assessment: In system Is this a Tele or Face-to-Face Assessment?: Face-to-Face Is this an Initial Assessment or a Re-assessment for this encounter?: Initial Assessment Marital status: Divorced [place] name: N/A Is patient pregnant?: No Pregnancy Status: No Living Arrangements: Children, Parent, Other relatives Can pt return to current living arrangement?: Yes Admission Status: Involuntary Is patient capable of signing voluntary admission?: Yes Referral Source: Self/Family/Friend Insurance type: Dietitian Exam ([place]) Medical Exam completed: Yes  Crisis Care Plan Living Arrangements: Children, Parent, Other relatives Legal Guardian: Other:(N/A) Name of Psychiatrist: Dr. Collie Siad Hansen([facility] ) Name of Therapist: N/A  Education Status Is patient currently in school?: No Current Grade: N/A Highest grade of school patient has completed: 11th Name of school: N/A Contact person: N/A  Risk to self with the past 6 months Suicidal Ideation: Yes-Currently Present Has patient been a risk to self within the  past 6 months prior to admission? : Yes Suicidal Intent: Yes-Currently Present Has patient had any suicidal intent within the past 6 months prior to admission? : Yes Is patient at risk for suicide?: Yes Suicidal Plan?: Yes-Currently Present Specify Current Suicidal Plan: Per pt "I'll do it however I can" Access to Means: Yes Specify Access to Suicidal Means: Kitchen knives, medications What has been your use of drugs/alcohol within the last 12 months?: Alcohol  Previous Attempts/Gestures: Yes How many times?: 1 Other Self Harm Risks: None reported  Triggers for Past Attempts: Unknown Intentional Self Injurious Behavior: None Family Suicide History: No Recent stressful life event(s): Other (Comment)(Per pt "everything") Persecutory voices/beliefs?: No Depression: Yes Depression Symptoms: Feeling worthless/self pity, Feeling angry/irritable Substance abuse history and/or treatment for substance abuse?: Yes Suicide prevention information given to non-admitted patients: Not applicable  Risk to Others within the past 6 months Homicidal Ideation: Yes-Currently Present Does patient have any lifetime risk of violence toward others beyond the six months prior to admission? : Yes (comment) Thoughts of Harm to Others: Yes-Currently Present Comment - Thoughts of Harm to Others: Per pt "Whoever gets in my way" Current Homicidal Intent: Yes-Currently Present Current Homicidal Plan: Yes-Currently Present Describe Current Homicidal Plan: Per pt "Whatever I have to do."  Access to Homicidal Means: Yes Describe Access to Homicidal Means: Knives Identified Victim: Per pt "Anyone who gets in my way" History of harm to others?: Yes Assessment of Violence: None Noted Violent Behavior Description: aggressive towards others Does patient have access to weapons?: Yes (Comment)(kichen knives) Criminal Charges Pending?: Yes Describe Pending Criminal Charges: driving while impaired; drivers license revoked;  open container of an alcohol beverage; intoxixated and disruptive   Does patient have a court date: Yes Court Date: 03/16/17(05/15/2017) Is patient on probation?: No  Psychosis Hallucinations: None noted Delusions: None noted  Mental Status Report Appearance/Hygiene: In hospital gown, Disheveled Eye Contact: Poor Motor Activity: Agitation Speech: Pressured Level of Consciousness: Alert Mood: Angry, Irritable Affect: Angry, Irritable Anxiety Level: Minimal Thought Processes: Flight of Ideas Judgement: Impaired Orientation: Person, Place, Time, Situation, Appropriate for developmental age Obsessive Compulsive Thoughts/Behaviors: None  Cognitive Functioning Concentration: Fair Memory: Recent Intact, Remote Intact IQ: Average Insight: Poor Impulse Control: Poor Appetite: Fair Weight Loss: 0 Weight Gain: 0 Sleep: Decreased Total Hours of Sleep: 1 Vegetative Symptoms: Staying in bed, Decreased grooming, Not bathing  ADLScreening [facility] Assessment Services) Patient's cognitive ability adequate to safely complete daily activities?: Yes Patient able to express need for assistance with ADLs?: Yes  Prior Inpatient Therapy Prior Inpatient Therapy: Yes Prior Therapy Dates: 09/11/2016(06/25/2016;05/26/2016) Prior Therapy Facilty/Provider(s): [facility] Reason for Treatment: Depression, Anxiety  Prior Outpatient Therapy Prior Outpatient Therapy: Yes Prior Therapy Dates: 2018 Prior Therapy Facilty/Provider(s): [place] Reason for Treatment: Depression Does patient have an ACCT team?: No Does patient have Intensive In-House Services?  : No Does patient have [facility] services? : No Does patient have P4CC services?: No  ADL Screening (condition at time of admission) Patient's cognitive ability adequate to safely complete daily activities?: Yes Is the patient deaf or have difficulty hearing?: No Does the patient have difficulty seeing, even when wearing  glasses/contacts?: No Does the patient have difficulty concentrating, remembering, or making decisions?: No Patient able to express need for assistance with ADLs?: Yes Does the patient have difficulty dressing or bathing?: No Communication: Independent Dressing (OT): Independent Grooming: Independent Feeding: Independent Bathing: Independent Toileting: Independent In/Out Bed: Independent Walks in Home: Independent Does the patient have difficulty walking or climbing stairs?: No Weakness of Legs: None Weakness of Arms/Hands: None  Home Assistive Devices/Equipment Home Assistive Devices/Equipment: None  Therapy Consults (therapy consults require a physician order) PT Evaluation Needed: No OT Evalulation Needed: No SLP Evaluation Needed: No Abuse/Neglect Assessment (Assessment to be complete while patient is alone) Abuse/Neglect Assessment Can Be Completed: Yes Physical Abuse: Denies Verbal Abuse: Denies Sexual Abuse: Denies Exploitation of patient/patient's resources: Denies Self-Neglect: Denies Values / Beliefs Cultural Requests During Hospitalization: None Spiritual Requests During Hospitalization: None Consults Spiritual Care Consult Needed: No Social Work Consult Needed: No Regulatory affairs officer (For Healthcare) Does Patient Have a Medical Advance Directive?: No Would patient like information on creating a medical advance directive?: No - Patient declined    Additional Information 1:1 In Past 12 Months?: No CIRT Risk: No Elopement Risk: No Does patient have medical clearance?: Yes     Disposition:  Disposition Initial Assessment Completed for this Encounter: Yes Disposition of Patient: Inpatient treatment program  On Site Evaluation by:   Reviewed with Physician:    Zuhayr Deeney L [place] Deleo, LPCA, LCASA 03/12/2017 3:57 PM

## 2017-03-12 NOTE — ED Notes (Signed)

## 2017-03-12 NOTE — BH Assessment (Signed)
Patient is to be admitted to [facility] by [name].  Attending Physician will be Dr.Clapacs.   Patient has been assigned to room 322, by [place] Nurse [place]. Intake Paper Work has been signed and placed on patient chart.  ER staff is aware of the admission [facility] ER Sect.; Dr. Nehemiah Settle, ER MD; Alyse Low Patient's Nurse & Genella Rife Patient Access).

## 2017-03-12 NOTE — ED Provider Notes (Signed)
Michael Schmitt  Time seen: 11:12 AM  I have reviewed the triage vital signs and the nursing notes.   HISTORY  Chief Complaint Mental Health Problem    HPI SOUA CALTAGIRONE is a 46 y.o. male with a past medical history of anxiety, alcohol abuse, diabetes, hypertension, hyperlipidemia, presents to the emergency department for acute agitation depression, suicidal thoughts and detox.  According to the patient he has been abusing alcohol and a daily basis, last use this morning.  He states increased depression and is now having thoughts of killing himself.  Denies recreational drug use.  Patient is hoping to detox from alcohol.  Patient is acutely agitated in the emergency department, yelling out at staff.  Asking for something to calm him down.  Past Medical History:  Diagnosis Date  . Anxiety   . Anxiety   . Arthritis    cerv. stenosis, spondylosis, HNP- lower back , has been followed in pain clinic, has  had injection s in cerv. area  . Blood dyscrasia    told that when he was young he was a" free bleeder"  . CAD (coronary artery disease)   . Cervical spondylosis without myelopathy 07/24/2014  . Cervicogenic headache 07/24/2014  . Chronic kidney disease    renal calculi- passed spontaneously  . Depression   . Diabetes mellitus without complication ([place])   . Fatty liver   . GERD (gastroesophageal reflux disease)   . Headache(784.0)   . Hyperlipidemia   . Hypertension   . Mental disorder   . MI, old   . RLS (restless legs syndrome)    detected on sleep study  . Shortness of breath   . Ventricular fibrillation ([place]) 04/05/2016    Patient Active Problem List   Diagnosis Date Noted  . Depression, major, recurrent, moderate ([place]) 03/10/2017  . Generalized anxiety disorder 02/05/2017  . Chronic neck pain (Primary Area of Pain) (Left) 11/22/2016  . Cervical facet syndrome (Left) 11/22/2016  . Cervical radiculitis (Left)  11/22/2016  . History of cervical spinal surgery 11/22/2016  . Cervical spondylosis 11/22/2016  . Chronic shoulder pain (Secondary Area of Pain) (Left) 11/22/2016  . Arthropathy of shoulder (Left) 11/22/2016  . Chronic low back pain [facility] Area of Pain) (Bilateral) (L>R) 11/22/2016  . Lumbar facet syndrome (Bilateral) (L>R) 11/22/2016  . Chronic pain syndrome 11/21/2016  . Long term prescription benzodiazepine use 11/21/2016  . Opiate use 11/21/2016  . Uncontrolled type 2 diabetes mellitus without complication, without long-term current use of insulin ([place]) 10/19/2016  . Chronic Suicidal ideation 09/15/2016  . Coronary artery disease 09/12/2016  . MDD (major depressive disorder), severe ([place]) 09/12/2016  . HTN (hypertension) 06/26/2016  . GERD (gastroesophageal reflux disease) 06/26/2016  . Alcohol use disorder, severe, 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[place]) 06/26/2016  . Moderate benzodiazepine use disorder ([place]) 06/11/2016  . Sedative, hypnotic or anxiolytic use disorder, severe, dependence ([place]) 05/12/2016  . Ventricular fibrillation ([place]) 04/05/2016  . Combined hyperlipidemia 01/05/2016  . Cervical spondylosis without myelopathy 07/24/2014  . Severe episode of recurrent major depressive disorder, without psychotic features ([place]) 04/15/2013  . Tobacco use disorder 10/20/2010  . Asthma 10/20/2010    Past Surgical History:  Procedure Laterality Date  . ANTERIOR CERVICAL DECOMP/DISCECTOMY FUSION  11/13/2011   Procedure: ANTERIOR CERVICAL DECOMPRESSION/DISCECTOMY FUSION 1 LEVEL;  Surgeon: [name], MD;  Location: [place] [facility];  Service: Neurosurgery;  Laterality: N/A;  Anterior Cervical Decompression/discectomy Fusion. Cervical three-four.  Marland Kitchen CARDIAC CATHETERIZATION N/A 01/01/2015  Procedure: Left Heart Cath and Coronary Angiography;  Surgeon: [name], MD;  Location: [place] CV LAB;  Service: Cardiovascular;  Laterality: N/A;  . CARDIAC CATHETERIZATION N/A 04/05/2016   Procedure:  Left Heart Cath and Coronary Angiography;  Surgeon: Belva Crome, MD;  Location: [place] CV LAB;  Service: Cardiovascular;  Laterality: N/A;  . CARDIAC CATHETERIZATION N/A 04/05/2016   Procedure: Coronary Stent Intervention;  Surgeon: Belva Crome, MD;  Location: [place] CV LAB;  Service: Cardiovascular;  Laterality: N/A;  . CARDIAC CATHETERIZATION N/A 04/05/2016   Procedure: Intravascular Ultrasound/IVUS;  Surgeon: Belva Crome, MD;  Location: [place] CV LAB;  Service: Cardiovascular;  Laterality: N/A;  . NASAL SINUS SURGERY     2005    Prior to Admission medications   Medication Sig Start Date End Date Taking? Authorizing Provider  aspirin EC 81 MG EC tablet Take 1 tablet (81 mg total) by mouth daily. 09/15/16   Hildred Priest, MD  atorvastatin (LIPITOR) 80 MG tablet Take 1 tablet (80 mg total) by mouth daily at 6 PM. 09/14/16   Hildred Priest, MD  carvedilol (COREG) 6.25 MG tablet Take 1 tablet (6.25 mg total) by mouth 2 (two) times daily with a meal. 09/14/16   Hildred Priest, MD  clopidogrel (PLAVIX) 75 MG tablet Take 1 tablet (75 mg total) by mouth daily. 09/15/16   Hildred Priest, MD  divalproex (DEPAKOTE) 500 MG DR tablet Take 500 mg by mouth 2 (two) times daily.    [provider]  gabapentin (NEURONTIN) 400 MG capsule Take 2 capsules (800 mg total) by mouth 4 (four) times daily. 09/14/16   Hildred Priest, MD  hydrOXYzine (ATARAX/VISTARIL) 50 MG tablet Take 50 mg by mouth 3 (three) times daily as needed.    [provider]  metFORMIN (GLUCOPHAGE) 500 MG tablet Take 1 tablet (500 mg total) by mouth 2 (two) times daily with a meal. 09/14/16   Hildred Priest, MD  mirtazapine (REMERON) 45 MG tablet Take 1 tablet (45 mg total) by mouth at bedtime. 09/14/16   Hildred Priest, MD  nitroGLYCERIN (NITROSTAT) 0.4 MG SL tablet Place 1 tablet (0.4 mg total) under the tongue every 5 (five)  minutes x 3 doses as needed. 09/14/16 09/14/17  Hildred Priest, MD  prazosin (MINIPRESS) 2 MG capsule Take 2 mg by mouth at bedtime.    [provider]  risperiDONE (RISPERDAL) 2 MG tablet Take 2 mg by mouth at bedtime.    [provider]  venlafaxine XR (EFFEXOR-XR) 75 MG 24 hr capsule Take 3 capsules (225 mg total) daily with breakfast by mouth. 02/05/17   Clapacs, Madie [place], MD    Allergies  Allergen Reactions  . Prednisone Other (See Comments)    Hypertension, makes him feel spacey  . Hydrocodone Other (See Comments)    Headache, irritable  . Varenicline Other (See Comments)    Suicidal thoughts    Family History  Problem Relation Age of Onset  . Prostate cancer Father   . Hypertension Mother   . Kidney Stones Mother   . Anxiety disorder Mother   . Depression Mother   . COPD Sister   . Hypertension Sister   . Diabetes Sister   . Depression Sister   . Anxiety disorder Sister   . Seizures Sister   . ADD / ADHD Son   . ADD / ADHD Daughter     Social History Social History   Tobacco Use  . Smoking status: Current Every Day Smoker  Packs/day: 2.00    Years: 17.00    Pack years: 34.00    Types: Cigarettes, Cigars  . Smokeless tobacco: Former Systems developer  . Tobacco comment: occassional snuff   Substance Use Topics  . Alcohol use: No    Alcohol/week: 0.0 oz    Comment: last drinked in 3 months.   . Drug use: No    Review of Systems Constitutional: Negative for fever. Cardiovascular: Negative for chest pain. Respiratory: Negative for shortness of breath. Gastrointestinal: Negative for abdominal pain, vomiting Neurological: Negative for headache All other ROS negative  ____________________________________________   PHYSICAL EXAM:  VITAL SIGNS: ED Triage Vitals  Enc Vitals Group     BP 03/12/17 1100 (!) 149/90     Pulse Rate 03/12/17 1100 (!) 115     Resp 03/12/17 1100 (!) 22     Temp --      Temp src --      SpO2 03/12/17 1100 97 %      Weight 03/12/17 1058 195 lb (88.5 kg)     Height 03/12/17 1058 6\' 2"  (1.88 m)     Head Circumference --      Peak Flow --      Pain Score 03/12/17 1057 10     Pain Loc --      Pain Edu? --      Excl. in [place]? --    Constitutional: Alert and oriented. Well appearing and in no distress. Eyes: Normal exam ENT   Head: Normocephalic and atraumatic.   Mouth/Throat: Mucous membranes are moist. Cardiovascular: Normal rate, regular rhythm. No murmur Respiratory: Normal respiratory effort without tachypnea nor retractions. Breath sounds are clear  Gastrointestinal: Soft and nontender. No distention.   Musculoskeletal: Nontender with normal range of motion in all extremities.  Neurologic:  Normal speech and language. No gross focal neurologic deficits Skin:  Skin is warm, dry and intact.  Psychiatric: Mood and affect are normal.   ____________________________________________  INITIAL IMPRESSION / ASSESSMENT AND PLAN / ED COURSE  Pertinent labs & imaging results that were available during my care of the patient were reviewed by me and considered in my medical decision making (see chart for details).  Patient presents to the emergency department for acute agitation, depression, suicidal thoughts and alcohol abuse.  Differential would include suicidal ideation, depression, EtOH use/abuse, anxiety.  We will check labs, have the patient seen by psychiatry.  Given the acute suicidal thoughts we will place the patient under IVC until he can be adequately evaluated by psychiatry.  Patient is acutely agitated at times in the emergency department although calm and cooperative during my exam.  Patient is asking for a shot to help him calm down.  I have ordered 20 mg of IM Geodon for the patient.  Patient's labs are resulted largely within normal limits besides a mildly elevated alcohol level.  Patient is once again acutely agitated yelling and screaming in the room.  When I went to speak to the  patient and asked him why he is yelling he says because he cannot sleep.  Patient has not slept since receiving Geodon, states he has not slept in "days".  I had a discussion with the patient about trying not CL in the emergency department as there is many other patients, he is agreeable.  I offered trazodone to help him sleep he says that does not work for him.  He is agreeable to a shot of Benadryl in order to help him get some sleep.  Continue to await psychiatric evaluation.  ____________________________________________   FINAL CLINICAL IMPRESSION(S) / ED DIAGNOSES  Agitation Anxiety Depression Suicidal ideation Alcohol abuse    Harvest Dark, MD 03/12/17 1349

## 2017-03-12 NOTE — ED Notes (Signed)
Pt cooperative with RN, but irritable. Pt endorses SI/HI. Denies AVH. Pt stated he hasn't slept in a week and none of the medications he was given in the ER have helped. Maintained on 15 minute checks and observation by security camera for safety.

## 2017-03-12 NOTE — Tx Team (Signed)
Initial Treatment Plan 03/12/2017 11:20 PM MATHIEU SCHLOEMER OYD:[id number]    PATIENT STRESSORS: Health problems Medication change or noncompliance Occupational concerns Substance abuse   PATIENT STRENGTHS: Average or above average intelligence Capable of independent living Communication skills Motivation for treatment/growth Supportive family/friends   PATIENT IDENTIFIED PROBLEMS: Suicidal thoughts    Chronic Neck Pain    Depression/Anxiety    Alcohol miss use    Diabetes      DISCHARGE CRITERIA:  Improved stabilization in mood, thinking, and/or behavior Motivation to continue treatment in a less acute level of care Need for constant or close observation no longer present Reduction of life-threatening or endangering symptoms to within safe limits Verbal commitment to aftercare and medication compliance  PRELIMINARY DISCHARGE PLAN: Attend aftercare/continuing care group Attend 12-step recovery group Outpatient therapy Participate in family therapy Return to previous living arrangement  PATIENT/FAMILY INVOLVEMENT: This treatment plan has been presented to and reviewed with the patient, ANDONI BUSCH,.  The patient  been given the opportunity to ask questions and make suggestions.  Clemens Catholic, RN 03/12/2017, 11:20 PM

## 2017-03-12 NOTE — ED Notes (Signed)
Medication for pain administered as ordered.

## 2017-03-12 NOTE — Consult Note (Signed)
[facility] Consult   Reason for Consult: Consult for 46 year old man with a history of depression who comes voluntarily to the emergency room Referring Physician: Paduchowski Patient Identification: Michael Schmitt MRN:  [id number] Principal Diagnosis: Severe episode of recurrent major depressive disorder, without psychotic features [facility]) Diagnosis:   Patient Active Problem List   Diagnosis Date Noted  . Depression, major, recurrent, moderate ([place]) [F33.1] 03/10/2017  . Generalized anxiety disorder [F41.1] 02/05/2017  . Chronic neck pain (Primary Area of Pain) (Left) [M54.2, G89.29] 11/22/2016  . Cervical facet syndrome (Left) [H85.277] 11/22/2016  . Cervical radiculitis (Left) [M54.12] 11/22/2016  . History of cervical spinal surgery [Z98.890] 11/22/2016  . Cervical spondylosis [M47.22] 11/22/2016  . Chronic shoulder pain (Secondary Area of Pain) (Left) [M25.512, G89.29] 11/22/2016  . 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 Chronic low back pain [facility] Area of Pain) (Bilateral) (L>R) [M54.5, G89.29] 11/22/2016  . Lumbar facet syndrome (Bilateral) (L>R) [M47.816] 11/22/2016  . Chronic pain syndrome [G89.4] 11/21/2016  . Long term prescription benzodiazepine use [Z79.899] 11/21/2016  . Opiate use [F11.90] 11/21/2016  . Uncontrolled type 2 diabetes mellitus without complication, without long-term current use of insulin ([place]) [E11.65] 10/19/2016  . Chronic Suicidal ideation [R45.851] 09/15/2016  . Coronary artery disease [I25.10] 09/12/2016  . MDD (major depressive disorder), severe ([place]) [F32.2] 09/12/2016  . HTN (hypertension) [I10] 06/26/2016  . GERD (gastroesophageal reflux disease) [K21.9] 06/26/2016  . Alcohol use disorder, severe, 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[place]) [F10.21] 06/26/2016  . Moderate benzodiazepine use disorder ([place]) [F13.20] 06/11/2016  . Sedative, hypnotic or anxiolytic use disorder, severe, dependence ([place]) [F13.20] 05/12/2016  .  Ventricular fibrillation ([place]) [I49.01] 04/05/2016  . 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 Cervical spondylosis without myelopathy [M47.812] 07/24/2014  . Severe episode of recurrent major depressive disorder, without psychotic features ([place]) [F33.2] 04/15/2013  . Tobacco use disorder [F17.200] 10/20/2010  . Asthma [J45.909] 10/20/2010    Total Time spent with patient: 1 hour  Subjective:   Michael Schmitt is a 46 y.o. male patient admitted with "I am so depressed I am just going to kill myself".  HPI: Patient interviewed chart reviewed.  Patient well known from multiple prior encounters.  46 year old man with a history of chronic anxiety and depression who frequently presents to the emergency room in crisis.  Today he says that he has not slept in several days and that his depression and anxiety has become so overwhelming that he cannot stop thinking about killing himself.  He is today threatening to beat himself on the head with a hammer.  He is getting outpatient psychiatric treatment but complains about it.  He denies hallucinations or psychosis.  He admits that he had several beers to drink this morning and that that is the first alcohol he has had in about a year.  Social history: Patient gets disability.  Lives with his children and his mother.  Very little social activity.  Medical history: Complaints of chronic pain as his primary medical issue.  Substance abuse history: Long-standing problems with benzodiazepine abuse and alcohol abuse.  From what he tells me his outpatient psychiatrist has finally stopped prescribing benzodiazepines to him.  Past Psychiatric History: Multiple presentations to the emergency room multiple hospitalizations multiple medicines have been tried.  Patient has a history of noncompliance and a history of poor response to treatment.  Poor response to recommendations he involve himself in therapy.  Presents usually as very histrionic and dependent.   Does have a history of suicide  attempts.  He has been fixated on ECT at times in the past but attempts at treatment in the past have not really panned out for any lasting treatment  Risk to Self: Suicidal Ideation: Yes-Currently Present Suicidal Intent: Yes-Currently Present Is patient at risk for suicide?: Yes Suicidal Plan?: Yes-Currently Present Specify Current Suicidal Plan: Per pt "I'll do it however I can" Access to Means: Yes Specify Access to Suicidal Means: Kitchen knives, medications What has been your use of drugs/alcohol within the last 12 months?: Alcohol  How many times?: 1 Other Self Harm Risks: None reported  Triggers for Past Attempts: Unknown Intentional Self Injurious Behavior: None Risk to Others: Homicidal Ideation: Yes-Currently Present Thoughts of Harm to Others: Yes-Currently Present Comment - Thoughts of Harm to Others: Per pt "Whoever gets in my way" Current Homicidal Intent: Yes-Currently Present Current Homicidal Plan: Yes-Currently Present Describe Current Homicidal Plan: Per pt "Whatever I have to do."  Access to Homicidal Means: Yes Describe Access to Homicidal Means: Knives Identified Victim: Per pt "Anyone who gets in my way" History of harm to others?: Yes Assessment of Violence: None Noted Violent Behavior Description: aggressive towards others Does patient have access to weapons?: Yes (Comment)(kichen knives) Criminal Charges Pending?: Yes Describe Pending Criminal Charges: driving while impaired; drivers license revoked; open container of an alcohol beverage; intoxixated and disruptive   Does patient have a court date: Yes Court Date: 03/16/17(05/15/2017) Prior Inpatient Therapy: Prior Inpatient Therapy: Yes Prior Therapy Dates: 09/11/2016(06/25/2016;05/26/2016) Prior Therapy Facilty/Provider(s): [facility] Reason for Treatment: Depression, Anxiety Prior Outpatient Therapy: Prior Outpatient Therapy: Yes Prior Therapy Dates: 2018 Prior Therapy  Facilty/Provider(s): [place] Reason for Treatment: Depression Does patient have an ACCT team?: No Does patient have Intensive In-House Services?  : No Does patient have [facility] services? : No Does patient have P4CC services?: No  Past Medical History:  Past Medical History:  Diagnosis Date  . Anxiety   . Anxiety   . Arthritis    cerv. stenosis, spondylosis, HNP- lower back , has been followed in pain clinic, has  had injection s in cerv. area  . Blood dyscrasia    told that when he was young he was a" free bleeder"  . CAD (coronary artery disease)   . Cervical spondylosis without myelopathy 07/24/2014  . Cervicogenic headache 07/24/2014  . Chronic kidney disease    renal calculi- passed spontaneously  . Depression   . Diabetes mellitus without complication ([place])   . Fatty liver   . GERD (gastroesophageal reflux disease)   . Headache(784.0)   . Hyperlipidemia   . Hypertension   . Mental disorder   . MI, old   . RLS (restless legs syndrome)    detected on sleep study  . Shortness of breath   . Ventricular fibrillation ([place]) 04/05/2016    Past Surgical History:  Procedure Laterality Date  . ANTERIOR CERVICAL DECOMP/DISCECTOMY FUSION  11/13/2011   Procedure: ANTERIOR CERVICAL DECOMPRESSION/DISCECTOMY FUSION 1 LEVEL;  Surgeon: [name], MD;  Location: [place] NEURO ORS;  Service: Neurosurgery;  Laterality: N/A;  Anterior Cervical Decompression/discectomy Fusion. Cervical three-four.  Marland Kitchen CARDIAC CATHETERIZATION N/A 01/01/2015   Procedure: Left Heart Cath and Coronary Angiography;  Surgeon: [name], MD;  Location: [place] CV LAB;  Service: Cardiovascular;  Laterality: N/A;  . CARDIAC CATHETERIZATION N/A 04/05/2016   Procedure: Left Heart Cath and Coronary Angiography;  Surgeon: Belva Crome, MD;  Location: [place] CV LAB;  Service: Cardiovascular;  Laterality: N/A;  . CARDIAC  CATHETERIZATION N/A 04/05/2016   Procedure: Coronary Stent  Intervention;  Surgeon: Belva Crome, MD;  Location: [place] CV LAB;  Service: Cardiovascular;  Laterality: N/A;  . CARDIAC CATHETERIZATION N/A 04/05/2016   Procedure: Intravascular Ultrasound/IVUS;  Surgeon: Belva Crome, MD;  Location: [place] CV LAB;  Service: Cardiovascular;  Laterality: N/A;  . NASAL SINUS SURGERY     2005   Family History:  Family History  Problem Relation Age of Onset  . Prostate cancer Father   . Hypertension Mother   . Kidney Stones Mother   . Anxiety disorder Mother   . Depression Mother   . COPD Sister   . Hypertension Sister   . Diabetes Sister   . Depression Sister   . Anxiety disorder Sister   . Seizures Sister   . ADD / ADHD Son   . ADD / ADHD Daughter    Family Psychiatric  History: Positive for anxiety Social History:  Social History   Substance and Sexual Activity  Alcohol Use No  . Alcohol/week: 0.0 oz   Comment: last drinked in 3 months.      Social History   Substance and Sexual Activity  Drug Use No    Social History   Socioeconomic History  . Marital status: Widowed    Spouse name: None  . Number of children: 3  . Years of education: 99  . Highest education level: None  Social Needs  . Financial resource strain: None  . Food insecurity - worry: None  . Food insecurity - inability: None  . Transportation needs - medical: None  . Transportation needs - non-medical: None  Occupational History    Comment: unemployed  Tobacco Use  . Smoking status: Current Every Day Smoker    Packs/day: 2.00    Years: 17.00    Pack years: 34.00    Types: Cigarettes, Cigars  . Smokeless tobacco: Former Systems developer  . Tobacco comment: occassional snuff   Substance and Sexual Activity  . Alcohol use: No    Alcohol/week: 0.0 oz    Comment: last drinked in 3 months.   . Drug use: No  . Sexual activity: Not Currently  Other Topics Concern  . None  Social History Narrative   Patient is right handed.   Patient drinks 2 sodas daily.    Additional Social History:    Allergies:   Allergies  Allergen Reactions  . Prednisone Other (See Comments)    Hypertension, makes him feel spacey  . Hydrocodone Other (See Comments)    Headache, irritable  . Varenicline Other (See Comments)    Suicidal thoughts    Labs:  Results for orders placed or performed during the hospital encounter of 03/12/17 (from the past 48 hour(s))  Urine Drug Screen, Qualitative     Status: None   Collection Time: 03/12/17 10:49 AM  Result Value Ref Range   Tricyclic, Ur Screen NONE DETECTED NONE DETECTED   Amphetamines, Ur Screen NONE DETECTED NONE DETECTED   MDMA (Ecstasy)Ur Screen NONE DETECTED NONE DETECTED   Cocaine Metabolite,Ur [place] NONE DETECTED NONE DETECTED   Opiate, Ur Screen NONE DETECTED NONE DETECTED   Phencyclidine (PCP) Ur S NONE DETECTED NONE DETECTED   Cannabinoid 50 Ng, Ur [place] NONE DETECTED NONE DETECTED   Barbiturates, Ur Screen NONE DETECTED NONE DETECTED   Benzodiazepine, Ur Scrn NONE DETECTED NONE DETECTED   Methadone Scn, Ur NONE DETECTED NONE DETECTED    Comment: (NOTE) 656  Tricyclics, urine  Cutoff 1000 ng/mL 200  Amphetamines, urine             Cutoff 1000 ng/mL 300  MDMA (Ecstasy), urine           Cutoff 500 ng/mL 400  Cocaine Metabolite, urine       Cutoff 300 ng/mL 500  Opiate, urine                   Cutoff 300 ng/mL 600  Phencyclidine (PCP), urine      Cutoff 25 ng/mL 700  Cannabinoid, urine              Cutoff 50 ng/mL 800  Barbiturates, urine             Cutoff 200 ng/mL 900  Benzodiazepine, urine           Cutoff 200 ng/mL 1000 Methadone, urine                Cutoff 300 ng/mL 1100 1200 The urine drug screen provides only a preliminary, unconfirmed 1300 analytical test result and should not be used for non-medical 1400 purposes. Clinical consideration and professional judgment should 1500 be applied to any positive drug screen result due to possible 1600 interfering substances. A more specific  alternate chemical method 1700 must be used in order to obtain a confirmed analytical result.  1800 Gas chromato graphy / mass spectrometry (GC/MS) is the preferred 1900 confirmatory method.   Glucose, capillary     Status: Abnormal   Collection Time: 03/12/17 11:01 AM  Result Value Ref Range   Glucose-Capillary 103 (H) 65 - 99 mg/dL  Comprehensive metabolic panel     Status: Abnormal   Collection Time: 03/12/17 11:04 AM  Result Value Ref Range   Sodium 136 135 - 145 mmol/L   Potassium 4.0 3.5 - 5.1 mmol/L   Chloride 102 101 - 111 mmol/L   CO2 19 (L) 22 - 32 mmol/L   Glucose, Bld 110 (H) 65 - 99 mg/dL   BUN 8 6 - 20 mg/dL   Creatinine, Ser 1.07 0.61 - 1.24 mg/dL   Calcium 9.5 8.9 - 10.3 mg/dL   Total Protein 7.7 6.5 - 8.1 g/dL   Albumin 4.5 3.5 - 5.0 g/dL   AST 31 15 - 41 U/L   ALT 18 17 - 63 U/L   Alkaline Phosphatase 96 38 - 126 U/L   Total Bilirubin 0.5 0.3 - 1.2 mg/dL   GFR calc non Af Amer >60 >60 mL/min   GFR calc Af Amer >60 >60 mL/min    Comment: (NOTE) The eGFR has been calculated using the CKD EPI equation. This calculation has not been validated in all clinical situations. eGFR's persistently <60 mL/min signify possible Chronic Kidney Disease.    Anion gap 15 5 - 15  Ethanol     Status: Abnormal   Collection Time: 03/12/17 11:04 AM  Result Value Ref Range   Alcohol, Ethyl (B) 37 (H) <10 mg/dL    Comment:        LOWEST DETECTABLE LIMIT FOR SERUM ALCOHOL IS 10 mg/dL FOR MEDICAL PURPOSES ONLY   cbc     Status: Abnormal   Collection Time: 03/12/17 11:04 AM  Result Value Ref Range   WBC 11.7 (H) 3.8 - 10.6 K/uL   RBC 5.30 4.40 - 5.90 MIL/uL   Hemoglobin 16.5 13.0 - 18.0 g/dL   HCT 49.6 40.0 - 52.0 %   MCV 93.5 80.0 - 100.0 fL   MCH  31.2 26.0 - 34.0 pg   MCHC 33.3 32.0 - 36.0 g/dL   RDW 14.5 11.5 - 14.5 %   Platelets 257 150 - 440 K/uL    No current facility-administered medications for this encounter.    Current Outpatient Medications  Medication  Sig Dispense Refill  . aspirin EC 81 MG EC tablet Take 1 tablet (81 mg total) by mouth daily. 30 tablet 0  . atorvastatin (LIPITOR) 80 MG tablet Take 1 tablet (80 mg total) by mouth daily at 6 PM. 30 tablet 0  . carvedilol (COREG) 6.25 MG tablet Take 1 tablet (6.25 mg total) by mouth 2 (two) times daily with a meal. 60 tablet 0  . clopidogrel (PLAVIX) 75 MG tablet Take 1 tablet (75 mg total) by mouth daily. 30 tablet 0  . divalproex (DEPAKOTE) 500 MG DR tablet Take 500 mg by mouth 2 (two) times daily.    Marland Kitchen gabapentin (NEURONTIN) 400 MG capsule Take 2 capsules (800 mg total) by mouth 4 (four) times daily. 240 capsule 0  . hydrOXYzine (ATARAX/VISTARIL) 50 MG tablet Take 50 mg by mouth 3 (three) times daily as needed.    . metFORMIN (GLUCOPHAGE) 500 MG tablet Take 1 tablet (500 mg total) by mouth 2 (two) times daily with a meal. 60 tablet 0  . mirtazapine (REMERON) 45 MG tablet Take 1 tablet (45 mg total) by mouth at bedtime. 30 tablet 0  . nitroGLYCERIN (NITROSTAT) 0.4 MG SL tablet Place 1 tablet (0.4 mg total) under the tongue every 5 (five) minutes x 3 doses as needed. 10 tablet 0  . prazosin (MINIPRESS) 2 MG capsule Take 2 mg by mouth at bedtime.    . risperiDONE (RISPERDAL) 2 MG tablet Take 2 mg by mouth at bedtime.    Marland Kitchen venlafaxine XR (EFFEXOR-XR) 75 MG 24 hr capsule Take 3 capsules (225 mg total) daily with breakfast by mouth. 90 capsule 1    Musculoskeletal: Strength & Muscle Tone: within normal limits Gait & Station: normal Patient leans: N/A  Psychiatric Specialty Exam: Physical Exam  Nursing note and vitals reviewed. Constitutional: He appears well-developed and well-nourished.  HENT:  Head: Normocephalic and atraumatic.  Eyes: Conjunctivae are normal. Pupils are equal, round, and reactive to light.  Neck: Normal range of motion.  Cardiovascular: Regular rhythm and normal heart sounds.  Respiratory: Effort normal.  GI: Soft.  Musculoskeletal: Normal range of motion.   Neurological: He is alert.  Skin: Skin is warm and dry.  Psychiatric: His speech is delayed. He is slowed. Cognition and memory are normal. He expresses impulsivity. He exhibits a depressed mood. He expresses suicidal ideation.    Review of Systems  Constitutional: Negative.   HENT: Negative.   Eyes: Negative.   Respiratory: Negative.   Cardiovascular: Negative.   Gastrointestinal: Negative.   Musculoskeletal: Negative.   Skin: Negative.   Neurological: Negative.   Psychiatric/Behavioral: Positive for depression, memory loss, substance abuse and suicidal ideas. Negative for hallucinations. The patient is nervous/anxious and has insomnia.     Blood pressure (!) 133/93, pulse 83, temperature 98.4 F (36.9 C), temperature source Oral, resp. rate 20, height _0  (1.88 m), weight 88.5 kg (195 lb), SpO2 95 %.Body mass index is 25.04 kg/m.  General Appearance: Guarded  Eye Contact:  Fair  Speech:  Slow  Volume:  Decreased  Mood:  Depressed  Affect:  Blunt  Thought Process:  Goal Directed  Orientation:  Full (Time, Place, and Person)  Thought Content:  Logical  Suicidal Thoughts:  Yes.  with intent/plan  Homicidal Thoughts:  No  Memory:  Immediate;   Good Recent;   Fair Remote;   Fair  Judgement:  Poor  Insight:  Shallow  Psychomotor Activity:  Decreased  Concentration:  Concentration: Poor  Recall:  [employer] of Knowledge:  Fair  Language:  Fair  Akathisia:  No  Handed:  Right  AIMS (if indicated):     Assets:  Desire for Improvement Housing Physical Health Resilience  ADL's:  Intact  Cognition:  Impaired,  Mild  Sleep:        Treatment Plan Summary: Daily contact with patient to assess and evaluate symptoms and progress in treatment, Medication management and Plan Patient had a blood alcohol level of just over 50 so he is not intoxicated.  No sign of withdrawal.  Continues to insist that he is going to kill himself outside the hospital.  Not really safe to let him  go we will plan on admission.  Continue usual outpatient psychiatric medicine.  Reassess depending on how quickly he gets admitted downstairs.  Labs reviewed.  Disposition: Recommend psychiatric Inpatient admission when medically cleared. Supportive therapy provided about ongoing stressors.  Alethia Berthold, MD 03/12/2017 4:48 PM

## 2017-03-12 NOTE — ED Triage Notes (Addendum)
Pt [rsents today with BPD. Pt has hx of bipolar, anxiety, suicidal ideations. BPD was called out to the pt house. Pt states he is a heavy drinker and has not taken his medication.BPD at bedside. Pt states he has a plan to commit suicide as well as a plan to hurt others. Pt states " He will hurt whom ever gets in his way." Pt also states " I will do what I have to do to make it happen."

## 2017-03-12 NOTE — ED Notes (Signed)
Patient resting quietly in room. No noted distress or abnormal behaviors noted. Will continue 15 minute checks and observation by security camera for safety. 

## 2017-03-13 ENCOUNTER — Other Ambulatory Visit: Payer: Self-pay | Admitting: *Deleted

## 2017-03-13 DIAGNOSIS — F332 Major depressive disorder, recurrent severe without psychotic features: Principal | ICD-10-CM

## 2017-03-13 LAB — LIPID PANEL
Cholesterol: 118 mg/dL (ref 0–200)
HDL: 31 mg/dL — ABNORMAL LOW (ref >40)
LDL Cholesterol: 22 mg/dL (ref 0–99)
Total CHOL/HDL Ratio: 3.8 RATIO
Triglycerides: 325 mg/dL — ABNORMAL HIGH (ref <150)
VLDL: 65 mg/dL — ABNORMAL HIGH (ref 0–40)

## 2017-03-13 LAB — TSH: TSH: 1.022 u[IU]/mL (ref 0.350–4.500)

## 2017-03-13 MED ORDER — ZOLPIDEM TARTRATE 5 MG PO TABS
5.0000 mg | ORAL_TABLET | Freq: Every day | ORAL | Status: DC
  Administered 2017-03-13 – 2017-03-14 (×2): 5 mg via ORAL
  Filled 2017-03-13 (×2): qty 1

## 2017-03-13 MED ORDER — TRAZODONE HCL 100 MG PO TABS: 100.0000 mg | ORAL_TABLET | Freq: Every day | ORAL | Status: DC

## 2017-03-13 MED ORDER — ADULT MULTIVITAMIN W/MINERALS CH
1.0000 | ORAL_TABLET | Freq: Every day | ORAL | Status: DC
  Administered 2017-03-13 – 2017-04-02 (×21): 1 via ORAL
  Filled 2017-03-13 (×21): qty 1

## 2017-03-13 MED ORDER — ONDANSETRON 4 MG PO TBDP: 4.0000 mg | ORAL_TABLET | Freq: Four times a day (QID) | ORAL | Status: AC | PRN

## 2017-03-13 MED ORDER — HYDROXYZINE HCL 50 MG PO TABS
50.0000 mg | ORAL_TABLET | Freq: Three times a day (TID) | ORAL | Status: DC | PRN
  Administered 2017-03-17 – 2017-04-02 (×7): 50 mg via ORAL
  Filled 2017-03-13 (×7): qty 1

## 2017-03-13 MED ORDER — LURASIDONE HCL 40 MG PO TABS
80.0000 mg | ORAL_TABLET | Freq: Every day | ORAL | Status: DC
  Administered 2017-03-14 – 2017-03-15 (×2): 80 mg via ORAL
  Filled 2017-03-13 (×2): qty 2

## 2017-03-13 MED ORDER — CHLORDIAZEPOXIDE HCL 25 MG PO CAPS: 25.0000 mg | ORAL_CAPSULE | Freq: Four times a day (QID) | ORAL | Status: AC | PRN

## 2017-03-13 MED ORDER — LOPERAMIDE HCL 2 MG PO CAPS: 2.0000 mg | ORAL_CAPSULE | ORAL | Status: AC | PRN

## 2017-03-13 MED ORDER — VITAMIN B-1 100 MG PO TABS
100.0000 mg | ORAL_TABLET | Freq: Every day | ORAL | Status: DC
  Administered 2017-03-14 – 2017-04-02 (×21): 100 mg via ORAL
  Filled 2017-03-13 (×20): qty 1

## 2017-03-13 MED ORDER — NICOTINE 21 MG/24HR TD PT24
21.0000 mg | MEDICATED_PATCH | Freq: Every day | TRANSDERMAL | Status: DC
  Administered 2017-03-13 – 2017-04-02 (×21): 21 mg via TRANSDERMAL
  Filled 2017-03-13 (×21): qty 1

## 2017-03-13 MED ORDER — TRAZODONE HCL 100 MG PO TABS
100.0000 mg | ORAL_TABLET | Freq: Once | ORAL | Status: AC
  Administered 2017-03-13: 100 mg via ORAL
  Filled 2017-03-13: qty 1

## 2017-03-13 MED ORDER — TRAZODONE HCL 100 MG PO TABS
150.0000 mg | ORAL_TABLET | Freq: Every day | ORAL | Status: DC
  Administered 2017-03-13 – 2017-03-14 (×2): 150 mg via ORAL
  Filled 2017-03-13 (×2): qty 1

## 2017-03-13 NOTE — Progress Notes (Signed)
Patient was admitted to the unit with security escort and belongings, patient appear depressed and anxious, alert and oriented able to answer assessment questions, Affect is flat and no signs of auditory or visual  Hallucinations, associations are intact, thinking is logical, and thought content is appropriate. Patient reports lack of sleep for several days before seeking help also endorses not taking his medications at home as he should and reports suicidal thoughts without intent to use a hammer to his head. Patient is living with his mother and two children. Shin assessment is done complete and body search was carried out by two RNs Alex/Slade and no contraband was found. Patient contract for safety of self and others. Patient is placed in room 322. Food and fluids was served to him before he goes to bed noted .

## 2017-03-13 NOTE — [facility] Suicide Risk Assessment (Addendum)
[place] INPATIENT:  Family/Significant Other Suicide Prevention Education  Suicide Prevention Education:  Contact Attempts: Michael Schmitt, pt's sister, at [phone] has been identified by the patient as the family member/significant other with whom the patient will be residing, and identified as the person(s) who will aid the patient in the event of a mental health crisis.  With written consent from the patient, two attempts were made to provide suicide prevention education, prior to and/or following the patient's discharge.  We were unsuccessful in providing suicide prevention education.  A suicide education pamphlet was given to the patient to share with family/significant other.  Date and time of first attempt: 03/13/17 at 2:30PM  Date and time of second attempt: 03/16/17 at 2:45  Michael Hipp, LCSW 03/16/2017, 2:56 PM

## 2017-03-13 NOTE — Progress Notes (Signed)
Recreation Therapy Notes  Date: 12.18.2018  Time: 9:30 am  Location: Craft Room  Behavioral response: N/A  Intervention Topic: Values  Discussion/Intervention: Patient did not attend group. Clinical Observations/Feedback:  Patient did not attend group. Livy Ross LRT/CTRS          Aubrie Lucien 03/13/2017 11:10 AM

## 2017-03-13 NOTE — Plan of Care (Signed)
Patient anxious and depressed.Denies SI.Safety maintained in the unit.

## 2017-03-13 NOTE — Plan of Care (Signed)
  Self-Concept: Ability to disclose and discuss suicidal ideas will improve 03/13/2017 2358 - Progressing by Providence Crosby, RN Note Pt passive SI- contracts for safety at this time   Self-Concept: Level of anxiety will decrease 03/13/2017 2358 - Progressing by Providence Crosby, RN Note Pt stated his anxiety was about thew same at this time as yesterday

## 2017-03-13 NOTE — Progress Notes (Signed)
Inpatient Diabetes Program Recommendations  AACE/ADA: New Consensus Statement on Inpatient Glycemic Control (2015)  Target Ranges:  Prepandial:   less than 140 mg/dL      Peak postprandial:   less than 180 mg/dL (1-2 hours)      Critically ill patients:  140 - 180 mg/dL   Lab Results  Component Value Date   GLUCAP 103 (H) 03/12/2017   HGBA1C 7.8 (H) 09/12/2016    Review of Glycemic Control  Results for Michael, Schmitt (MRN [id number]) as of 03/13/2017 14:10  Ref. Range 03/12/2017 11:01  Glucose-Capillary Latest Ref Range: 65 - 99 mg/dL 103 (H)    Inpatient Diabetes Program Recommendations:  Consider changing diet to carb modified.  With the amount of alcohol he consumes, Metformin may not be the best option to manage his diabetes.  Please have him discuss this with his primary care doctor at this next visit.  Appreciate A1C being taken so we can evaluate control.  Gentry Fitz, RN, BA, MHA, CDE Diabetes Coordinator Inpatient Diabetes Program  [phone] (Team Pager) [phone] ([place]) 03/13/2017 2:12 PM

## 2017-03-13 NOTE — H&P (Addendum)
Psychiatric Admission Assessment Adult  Patient Identification: Michael Schmitt MRN:  [id number] Date of Evaluation:  03/13/2017 Chief Complaint:  Depression Principal Diagnosis: Severe recurrent major depression without psychotic features [facility]) Diagnosis:   Patient Active Problem List   Diagnosis Date Noted  . Severe recurrent major depression without psychotic features ([place]) [F33.2] 03/12/2017  . Depression, major, recurrent, moderate ([place]) [F33.1] 03/10/2017  . Generalized anxiety disorder [F41.1] 02/05/2017  . Chronic neck pain (Primary Area of Pain) (Left) [M54.2, G89.29] 11/22/2016  . Cervical facet syndrome (Left) [W23.762] 11/22/2016  . Cervical radiculitis (Left) [M54.12] 11/22/2016  . History of cervical spinal surgery [Z98.890] 11/22/2016  . Cervical spondylosis [M47.22] 11/22/2016  . Chronic shoulder pain (Secondary Area of Pain) (Left) [M25.512, G89.29] 11/22/2016  . 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 Chronic low back pain [facility] Area of Pain) (Bilateral) (L>R) [M54.5, G89.29] 11/22/2016  . Lumbar facet syndrome (Bilateral) (L>R) [M47.816] 11/22/2016  . Chronic pain syndrome [G89.4] 11/21/2016  . Long term prescription benzodiazepine use [Z79.899] 11/21/2016  . Opiate use [F11.90] 11/21/2016  . Uncontrolled type 2 diabetes mellitus without complication, without long-term current use of insulin ([place]) [E11.65] 10/19/2016  . Chronic Suicidal ideation [R45.851] 09/15/2016  . Coronary artery disease [I25.10] 09/12/2016  . MDD (major depressive disorder), severe ([place]) [F32.2] 09/12/2016  . HTN (hypertension) [I10] 06/26/2016  . GERD (gastroesophageal reflux disease) [K21.9] 06/26/2016  . Alcohol use disorder, severe, 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[place]) [F10.21] 06/26/2016  . Moderate benzodiazepine use disorder ([place]) [F13.20] 06/11/2016  . Sedative, hypnotic or anxiolytic use disorder, severe, dependence ([place]) [F13.20] 05/12/2016  . Ventricular fibrillation  ([place]) [I49.01] 04/05/2016  . 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 Cervical spondylosis without myelopathy [M47.812] 07/24/2014  . Severe episode of recurrent major depressive disorder, without psychotic features ([place]) [F33.2] 04/15/2013  . Tobacco use disorder [F17.200] 10/20/2010  . Asthma [J45.909] 10/20/2010   History of Present Illness: 46 yo male admitted due to depression and SI. Pt has long history of depression and chronic suicidal thoughts. Pt reports that he has been hospitalized at least 10 times this year and "they haven't been helpful." He states that he is depressed 'all the time." He has been on many medications in the past and he feels like none of them have helped. He has chronic SI and states that he has them "every day" for many years. He states that he never acts on them because he has 3 kids ages 20,13 and 15. He is the primary care giver for them and "I would never do that to them. Also I'm a Darrick Meigs so I wouldn't kill myself." He reports that things have worsened recently due to insomnia. He has struggled with insomnia for several years. He states that when he was on Ambien at [facility] 'that was very helpful." He states that he lays in bed for many hours. He does not have any routine during the day and does not really get out of the house. HE sleeps on the couch and also hangs out on the couch all day. He is not active at all. He sees Dr. Kasandra Knudsen and was recently started on [place] with plan to taper off Effexor eventually. They recently went down to 225 mg from 300 mg. Pt states that he drinks about 12-24 beers "2-3 times a week." He states that he has 2 DUIs and has court on Friday. His lawyer rescheduled this and he is not sure when it is. He states that he may get 2-7 days  in jail and "this is really weighing on me." He states that his lawyer told him that if he is willing to get alcohol treatment then he might only get 2 days in jail. He is wanting either residential or  outpatient treatment for alcohol. Pt also has chronic pain from neck surgery which worsens depression. Pt has history of benzo dependence and used to beo n high doses of Klonopin but psychiatrist no longer prescribing this.   Associated Signs/Symptoms: Depression Symptoms:  depressed mood, anhedonia, insomnia, fatigue, feelings of worthlessness/guilt, hopelessness, suicidal thoughts without plan, (Hypo) Manic Symptoms:  Denies Anxiety Symptoms:  Excessive Worry, Psychotic Symptoms:  Denies PTSD Symptoms:  Total Time spent with patient: 1 hour  Past Psychiatric History: Long history of depression.He has tried many medication trials with little improvement. He sees Dr. Kasandra Knudsen about monthly. He does not have a therapist. He reports history of suicide attempt by "putting gun to my head in the past." He reports at least 10 inpatient hospitalizations in the past year. Last was in [place]. HE states that hospitalizations have never been helpful for him.   Is the patient at risk to self? Yes.    Has the patient been a risk to self in the past 6 months? Yes.    Has the patient been a risk to self within the distant past? Yes.    Is the patient a risk to others? No.  Has the patient been a risk to others in the past 6 months? No.  Has the patient been a risk to others within the distant past? No.   Alcohol Screening: 1. How often do you have a drink containing alcohol?: 2 to 3 times a week 2. How many drinks containing alcohol do you have on a typical day when you are drinking?: 3 or 4 3. How often do you have six or more drinks on one occasion?: Less than monthly AUDIT-C Score: 5 4. How often during the last year have you found that you were not able to stop drinking once you had started?: Less than monthly 5. How often during the last year have you failed to do what was normally expected from you becasue of drinking?: Less than monthly 6. How often during the last year have you needed a first  drink in the morning to get yourself going after a heavy drinking session?: Less than monthly 7. How often during the last year have you had a feeling of guilt of remorse after drinking?: Less than monthly 8. How often during the last year have you been unable to remember what happened the night before because you had been drinking?: Monthly 9. Have you or someone else been injured as a result of your drinking?: No 10. Has a relative or friend or a doctor or another health worker been concerned about your drinking or suggested you cut down?: No Alcohol Use Disorder Identification Test Final Score (AUDIT): 11 Intervention/Follow-up: Alcohol Education Substance Abuse History in the last 12 months:  Yes.  alcohol abuse Consequences of Substance Abuse: Legal Consequences:  2 DUIs Previous Psychotropic Medications: Yes  Psychological Evaluations: Yes  Past Medical History:  Past Medical History:  Diagnosis Date  . Anxiety   . Anxiety   . Arthritis    cerv. stenosis, spondylosis, HNP- lower back , has been followed in pain clinic, has  had injection s in cerv. area  . Blood dyscrasia    told that when he was young he was a" free bleeder"  .  CAD (coronary artery disease)   . Cervical spondylosis without myelopathy 07/24/2014  . Cervicogenic headache 07/24/2014  . Chronic kidney disease    renal calculi- passed spontaneously  . Depression   . Diabetes mellitus without complication ([place])   . Fatty liver   . GERD (gastroesophageal reflux disease)   . Headache(784.0)   . Hyperlipidemia   . Hypertension   . Mental disorder   . MI, old   . RLS (restless legs syndrome)    detected on sleep study  . Shortness of breath   . Ventricular fibrillation ([place]) 04/05/2016    Past Surgical History:  Procedure Laterality Date  . ANTERIOR CERVICAL DECOMP/DISCECTOMY FUSION  11/13/2011   Procedure: ANTERIOR CERVICAL DECOMPRESSION/DISCECTOMY FUSION 1 LEVEL;  Surgeon: [name], MD;  Location: [place] NEURO  ORS;  Service: Neurosurgery;  Laterality: N/A;  Anterior Cervical Decompression/discectomy Fusion. Cervical three-four.  Marland Kitchen CARDIAC CATHETERIZATION N/A 01/01/2015   Procedure: Left Heart Cath and Coronary Angiography;  Surgeon: [name], MD;  Location: [place] CV LAB;  Service: Cardiovascular;  Laterality: N/A;  . CARDIAC CATHETERIZATION N/A 04/05/2016   Procedure: Left Heart Cath and Coronary Angiography;  Surgeon: Belva Crome, MD;  Location: [place] CV LAB;  Service: Cardiovascular;  Laterality: N/A;  . CARDIAC CATHETERIZATION N/A 04/05/2016   Procedure: Coronary Stent Intervention;  Surgeon: Belva Crome, MD;  Location: [place] CV LAB;  Service: Cardiovascular;  Laterality: N/A;  . CARDIAC CATHETERIZATION N/A 04/05/2016   Procedure: Intravascular Ultrasound/IVUS;  Surgeon: Belva Crome, MD;  Location: [place] CV LAB;  Service: Cardiovascular;  Laterality: N/A;  . NASAL SINUS SURGERY     2005   Family History:  Family History  Problem Relation Age of Onset  . Prostate cancer Father   . Hypertension Mother   . Kidney Stones Mother   . Anxiety disorder Mother   . Depression Mother   . COPD Sister   . Hypertension Sister   . Diabetes Sister   . Depression Sister   . Anxiety disorder Sister   . Seizures Sister   . ADD / ADHD Son   . ADD / ADHD Daughter    Family Psychiatric  History: Unknown Tobacco Screening: Have you used any form of tobacco in the last 30 days? (Cigarettes, Smokeless Tobacco, Cigars, and/or Pipes): Yes Tobacco use, Select all that apply: cigar use daily Are you interested in Tobacco Cessation Medications?: Yes, will notify MD for an order Counseled patient on smoking cessation including recognizing danger situations, developing coping skills and basic information about quitting provided: Yes Social History: From [place]. Currently living with his mom, sister, and 2 kids. He is primary caregiver for his children ages 31 and 54. He also has a  53 year old kid. He has been on disability since 2015. He used to work as a Clinical cytogeneticist. He states that he does not have much of a support system.           Allergies:   Allergies  Allergen Reactions  . Prednisone Other (See Comments)    Hypertension, makes him feel spacey  . Hydrocodone Other (See Comments)    Headache, irritable  . Varenicline Other (See Comments)    Suicidal thoughts   Lab Results:  Results for orders placed or performed during the hospital encounter of 03/12/17 (from the past 48 hour(s))  Lipid panel     Status: Abnormal   Collection Time: 03/13/17  6:33 AM  Result Value Ref Range  Cholesterol 118 0 - 200 mg/dL   Triglycerides 325 (H) <150 mg/dL   HDL 31 (L) >40 mg/dL   Total CHOL/HDL Ratio 3.8 RATIO   VLDL 65 (H) 0 - 40 mg/dL   LDL Cholesterol 22 0 - 99 mg/dL    Comment:        Total Cholesterol/HDL:CHD Risk Coronary Heart Disease Risk Table                     Men   Women  1/2 Average Risk   3.4   3.3  Average Risk       5.0   4.4  2 X Average Risk   9.6   7.1  3 X Average Risk  23.4   11.0        Use the calculated Patient Ratio above and the CHD Risk Table to determine the patient's CHD Risk.        ATP III CLASSIFICATION (LDL):  <100     mg/dL   Optimal  100-129  mg/dL   Near or Above                    Optimal  130-159  mg/dL   Borderline  160-189  mg/dL   High  >190     mg/dL   Very High   TSH     Status: None   Collection Time: 03/13/17  6:33 AM  Result Value Ref Range   TSH 1.022 0.350 - 4.500 uIU/mL    Comment: Performed by a 3rd Generation assay with a functional sensitivity of <=0.01 uIU/mL.    Blood Alcohol level:  Lab Results  Component Value Date   ETH 37 (H) 03/12/2017   ETH <10 63/03/6008    Metabolic Disorder Labs:  Lab Results  Component Value Date   HGBA1C 7.8 (H) 09/12/2016   MPG 177 09/12/2016   MPG 174 08/03/2016   Lab Results  Component Value Date   PROLACTIN 18.1 (H) 09/12/2016   PROLACTIN 6.5  05/27/2016   Lab Results  Component Value Date   CHOL 118 03/13/2017   TRIG 325 (H) 03/13/2017   HDL 31 (L) 03/13/2017   CHOLHDL 3.8 03/13/2017   VLDL 65 (H) 03/13/2017   LDLCALC 22 03/13/2017   LDLCALC 37 09/12/2016    Current Medications: Current Facility-Administered Medications  Medication Dose Route Frequency Provider Last Rate Last Dose  . acetaminophen (TYLENOL) tablet 650 mg  650 mg Oral Q6H PRN Clapacs, Madie Reno, MD   650 mg at 03/13/17 0834  . alum & mag hydroxide-simeth (MAALOX/MYLANTA) 200-200-20 MG/5ML suspension 30 mL  30 mL Oral Q4H PRN Clapacs, John T, MD      . aspirin EC tablet 81 mg  81 mg Oral Daily Clapacs, Madie Reno, MD   81 mg at 03/13/17 0834  . atorvastatin (LIPITOR) tablet 80 mg  80 mg Oral q1800 Clapacs, John T, MD      . carvedilol (COREG) tablet 6.25 mg  6.25 mg Oral BID WC Clapacs, Madie Reno, MD   6.25 mg at 03/13/17 0834  . clopidogrel (PLAVIX) tablet 75 mg  75 mg Oral Daily Clapacs, Madie Reno, MD   75 mg at 03/13/17 0835  . divalproex (DEPAKOTE) DR tablet 1,500 mg  1,500 mg Oral QHS Clapacs, John T, MD   1,500 mg at 03/12/17 2100  . hydrOXYzine (ATARAX/VISTARIL) tablet 25 mg  25 mg Oral TID PRN Clapacs, Madie Reno, MD      . [  START ON 03/14/2017] lurasidone (LATUDA) tablet 80 mg  80 mg Oral Q breakfast Jahnai Slingerland R, MD      . magnesium hydroxide (MILK OF MAGNESIA) suspension 30 mL  30 mL Oral Daily PRN Clapacs, John T, MD      . metFORMIN (GLUCOPHAGE) tablet 500 mg  500 mg Oral BID WC Clapacs, Madie Reno, MD   500 mg at 03/13/17 0834  . mirtazapine (REMERON) tablet 45 mg  45 mg Oral QHS Clapacs, John T, MD   45 mg at 03/12/17 2100  . prazosin (MINIPRESS) capsule 2 mg  2 mg Oral QHS Clapacs, Madie Reno, MD   2 mg at 03/12/17 2108  . traZODone (DESYREL) tablet 100 mg  100 mg Oral QHS PRN Clapacs, Madie Reno, MD   100 mg at 03/12/17 2059  . traZODone (DESYREL) tablet 100 mg  100 mg Oral QHS Ahilyn Nell R, MD      . venlafaxine XR (EFFEXOR-XR) 24 hr capsule 225 mg  225 mg Oral Q  breakfast Clapacs, John T, MD   225 mg at 03/13/17 0834  . zolpidem (AMBIEN) tablet 5 mg  5 mg Oral QHS Casyn Becvar, Tyson Babinski, MD       PTA Medications: Medications Prior to Admission  Medication Sig Dispense Refill Last Dose  . aspirin EC 81 MG EC tablet Take 1 tablet (81 mg total) by mouth daily. 30 tablet 0 Taking  . atorvastatin (LIPITOR) 80 MG tablet Take 1 tablet (80 mg total) by mouth daily at 6 PM. 30 tablet 0 Taking  . carvedilol (COREG) 6.25 MG tablet Take 1 tablet (6.25 mg total) by mouth 2 (two) times daily with a meal. 60 tablet 0 Taking  . clopidogrel (PLAVIX) 75 MG tablet Take 1 tablet (75 mg total) by mouth daily. 30 tablet 0 Taking  . divalproex (DEPAKOTE) 500 MG DR tablet Take 500 mg by mouth 2 (two) times daily.   Taking  . gabapentin (NEURONTIN) 400 MG capsule Take 2 capsules (800 mg total) by mouth 4 (four) times daily. 240 capsule 0 Taking  . hydrOXYzine (ATARAX/VISTARIL) 50 MG tablet Take 50 mg by mouth 3 (three) times daily as needed.   Taking  . metFORMIN (GLUCOPHAGE) 500 MG tablet Take 1 tablet (500 mg total) by mouth 2 (two) times daily with a meal. 60 tablet 0 Taking  . mirtazapine (REMERON) 45 MG tablet Take 1 tablet (45 mg total) by mouth at bedtime. 30 tablet 0 Taking  . nitroGLYCERIN (NITROSTAT) 0.4 MG SL tablet Place 1 tablet (0.4 mg total) under the tongue every 5 (five) minutes x 3 doses as needed. 10 tablet 0 Taking  . prazosin (MINIPRESS) 2 MG capsule Take 2 mg by mouth at bedtime.   Taking  . risperiDONE (RISPERDAL) 2 MG tablet Take 2 mg by mouth at bedtime.   Taking  . venlafaxine XR (EFFEXOR-XR) 75 MG 24 hr capsule Take 3 capsules (225 mg total) daily with breakfast by mouth. 90 capsule 1     Musculoskeletal: Strength & Muscle Tone: within normal limits Gait & Station: normal Patient leans: N/A  Psychiatric Specialty Exam: Physical Exam  ROS  Blood pressure 125/87, pulse 99, temperature 98.2 F (36.8 C), resp. rate 20, height 6\' 2"  (1.88 m), weight 88.5  kg (195 lb), SpO2 98 %.Body mass index is 25.04 kg/m.  General Appearance: Casual  Eye Contact:  Fair  Speech:  Clear and Coherent  Volume:  Normal  Mood:  Depressed  Affect:  Blunt and  Congruent  Thought Process:  Coherent and Goal Directed  Orientation:  Full (Time, Place, and Person)  Thought Content:  Negative  Suicidal Thoughts:  Yes.  without intent/plan  Homicidal Thoughts:  No  Memory:  Immediate;   Fair  Judgement:  Impaired  Insight:  Lacking  Psychomotor Activity:  Normal  Concentration:  Concentration: Fair  Recall:  Good  Fund of Knowledge:  Fair  Language:  Fair  Akathisia:  No      Assets:  Communication Skills Resilience  ADL's:  Intact  Cognition:  WNL  Sleep:  Number of Hours: 5.45    Treatment Plan Summary: 46 yo male with history of multiple admission, chronic depression and SI admitted due to SI and insomnia. Pt's main focus is insomnia and reports this has caused worsening depression. He has upcoming court hearing for DUI and needs to show the judge that he is making an effort for treatment. He is requesting residential or outpatient alcohol treatment. He reports chronic SI "everyday" for several years. He states that he will not act on it because of his children. He has had at least 10 hospitalization in the past year and has not had much benefit from that. He has poor response to medications. He does not have a daily routine and does not get out of the house which is likely contributing to insomnia (he sleeps in living room and hangs out there all day) and depression. HE also does not see a therapist which would be of benefit for him. Will try to help him get some sleep while in the hospital since this is a primary concern for him.  Plan:  Mood disorder -Restart home medications of Latuda 80 mg daily (this was increased recently), Effexor XR 225 mg daily, and Remeron 45 mg qhs, Depakote 1500 mg qhs  Insomnia _trazodone 100 mg qhs -Ambien 5 mg qhs. He is  aware that he will not be prescribed this outside of the hospital.   Alcohol abuse -will have CSW look into treatment options  HTN -Carvedilol 6.25 mg BID  DM -Metformin 500 mg BID    Observation Level/Precautions:  15 minute checks  Laboratory:  Done in ED  Psychotherapy:    Medications:    Consultations:    Discharge Concerns:    Estimated LOS: 3-5 days  Other:     Physician Treatment Plan for Primary Diagnosis: Severe recurrent major depression without psychotic features ([place]) Long Term Goal(s): Improvement in symptoms so as ready for discharge  Short Term Goals: Ability to disclose and discuss suicidal ideas and Ability to demonstrate self-control will improve   I certify that inpatient services furnished can reasonably be expected to improve the patient's condition.    Marylin Crosby, MD 12/18/201811:53 AM
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
## 2017-03-13 NOTE — Progress Notes (Signed)
Patient is very anxious and depressed today.Patient states "everything my whole life makes me depressed."Denies SI,HI and AVH.In and out of room.Patient states "I have not sleeping for days."Compliant with medications.Appetite and energy level good.Support and encouragement given.

## 2017-03-13 NOTE — B[facility] Group Notes (Signed)
03/13/2017 1PM  Type of Therapy/Topic:  Group Therapy:  Feelings about Diagnosis  Participation Level:  Minimal   Description of Group:   This group will allow patients to explore their thoughts and feelings about diagnoses they have received. Patients will be guided to explore their level of understanding and acceptance of these diagnoses. Facilitator will encourage patients to process their thoughts and feelings about the reactions of others to their diagnosis and will guide patients in identifying ways to discuss their diagnosis with significant others in their lives. This group will be process-oriented, with patients participating in exploration of their own experiences, giving and receiving support, and processing challenge from other group members.   Therapeutic Goals: 1. Patient will demonstrate understanding of diagnosis as evidenced by identifying two or more symptoms of the disorder 2. Patient will be able to express two feelings regarding the diagnosis 3. Patient will demonstrate their ability to communicate their needs through discussion and/or role play  Summary of Patient Progress: Pt continues to work towards their tx goals but has not yet reached them. Pt was able to appropriately participate in group discussion, and was able to offer support/validation to other group members. Pt reported that he was diagnosed, "years ago with alcohol problems, but the Bipolar is a new diagnosis." Pt reports that he feels, "hopeless at times and judged frequently from others." Pt reported feeling depressed today and was not active in group discussions but would participate when prompted by CSW.    Therapeutic Modalities:   Cognitive Behavioral Therapy Brief Therapy   Alden Hipp, MSW, LCSW 03/13/2017 1:53 PM

## 2017-03-13 NOTE — [facility] Group Notes (Signed)
[facility] Group Notes:  (Nursing/MHT/Case Management/Adjunct)  Date:  03/13/2017  Time:  3:00 PM  Type of Therapy:  Psychoeducational Skills  Participation Level:  None  Participation Quality:  Drowsy  Affect:  Flat and Resistant  Cognitive:  Lacking  Insight:  None  Engagement in Group:  None and Poor  Modes of Intervention:  Discussion  Summary of Progress/Problems:  Michael Schmitt 03/13/2017, 3:00 PM

## 2017-03-13 NOTE — Patient Outreach (Signed)
[facility]) Care Management  03/13/2017  MANDEL SEIDEN 1970-12-30 [id number]   Referral Date:03/13/2017 Referral Source: [facility] ED List Referral Reason: High ED utilization Insurance: [place] received referral.  This patient is enrolled in external program and does not meet the program criteria at this time. Patient is in [employer].  [place] Care Management [phone]

## 2017-03-14 LAB — HEMOGLOBIN A1C
Hgb A1c MFr Bld: 6 % — ABNORMAL HIGH (ref 4.8–5.6)
MEAN PLASMA GLUCOSE: 126 mg/dL

## 2017-03-14 NOTE — Plan of Care (Signed)
Affect flat. Minimal interaction with staff.  No interaction noted with peers.  Isolates to room the majority of shift.  When visible in the milieu, paces hall.  No group attendance.    Support and encouragement offered.  Safety rounds maintained.

## 2017-03-14 NOTE — Progress Notes (Signed)
Recreation Therapy Notes  Date: 12.19.2018  Time: 1:00pm   Location: Craft Room  Behavioral response: N/A  Intervention Topic: Self-esteem  Discussion/Intervention: Patient did not attend group. Clinical Observations/Feedback:  Patient did not attend group.   Makinna Andy LRT/CTRS          Annise Boran 03/14/2017 2:53 PM

## 2017-03-14 NOTE — Progress Notes (Addendum)
[facility] MD Progress Note  03/14/2017 2:35 PM Michael Schmitt  MRN:  [id number] Subjective:  10/10 for depression with suicidal ideations but no plan, contracts for safety.  Lying on his bed resting quietly, not interacting in the milieu or attending groups despite encouragement.  "Tired of feeling this way, want to feel better."  Denies hallucinations or withdrawal symptoms.  Agreeable to go to rehab after discharge.  Lives with his mother, sister, and two children with minimal issues.  Disability for neck and back issues.   Principal Problem: Severe recurrent major depression without psychotic features ([place]) Diagnosis:   Patient Active Problem List   Diagnosis Date Noted  . Severe recurrent major depression without psychotic features ([place]) [F33.2] 03/12/2017    Priority: High  . Generalized anxiety disorder [F41.1] 02/05/2017  . Chronic neck pain (Primary Area of Pain) (Left) [M54.2, G89.29] 11/22/2016  . Cervical facet syndrome (Left) [N82.956] 11/22/2016  . Cervical radiculitis (Left) [M54.12] 11/22/2016  . History of cervical spinal surgery [Z98.890] 11/22/2016  . Cervical spondylosis [M47.22] 11/22/2016  . Chronic shoulder pain (Secondary Area of Pain) (Left) [M25.512, G89.29] 11/22/2016  . 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 Chronic low back pain [facility] Area of Pain) (Bilateral) (L>R) [M54.5, G89.29] 11/22/2016  . Lumbar facet syndrome (Bilateral) (L>R) [M47.816] 11/22/2016  . Chronic pain syndrome [G89.4] 11/21/2016  . Long term prescription benzodiazepine use [Z79.899] 11/21/2016  . Opiate use [F11.90] 11/21/2016  . Uncontrolled type 2 diabetes mellitus without complication, without long-term current use of insulin ([place]) [E11.65] 10/19/2016  . Chronic Suicidal ideation [R45.851] 09/15/2016  . Coronary artery disease [I25.10] 09/12/2016  . HTN (hypertension) [I10] 06/26/2016  . GERD (gastroesophageal reflux disease) [K21.9] 06/26/2016  . Alcohol use disorder,  severe, 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[place]) [F10.21] 06/26/2016  . Moderate benzodiazepine use disorder ([place]) [F13.20] 06/11/2016  . Sedative, hypnotic or anxiolytic use disorder, severe, dependence ([place]) [F13.20] 05/12/2016  . Ventricular fibrillation ([place]) [I49.01] 04/05/2016  . 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 Cervical spondylosis without myelopathy [M47.812] 07/24/2014  . Severe episode of recurrent major depressive disorder, without psychotic features ([place]) [F33.2] 04/15/2013  . Tobacco use disorder [F17.200] 10/20/2010  . Asthma [J45.909] 10/20/2010   Total Time spent with patient: 30 minutes  Past Psychiatric History: depression, alcohol dependence  Past Medical History:  Past Medical History:  Diagnosis Date  . Anxiety   . Anxiety   . Arthritis    cerv. stenosis, spondylosis, HNP- lower back , has been followed in pain clinic, has  had injection s in cerv. area  . Blood dyscrasia    told that when he was young he was a" free bleeder"  . CAD (coronary artery disease)   . Cervical spondylosis without myelopathy 07/24/2014  . Cervicogenic headache 07/24/2014  . Chronic kidney disease    renal calculi- passed spontaneously  . Depression   . Diabetes mellitus without complication ([place])   . Fatty liver   . GERD (gastroesophageal reflux disease)   . Headache(784.0)   . Hyperlipidemia   . Hypertension   . Mental disorder   . MI, old   . RLS (restless legs syndrome)    detected on sleep study  . Shortness of breath   . Ventricular fibrillation ([place]) 04/05/2016    Past Surgical History:  Procedure Laterality Date  . ANTERIOR CERVICAL DECOMP/DISCECTOMY FUSION  11/13/2011   Procedure: ANTERIOR CERVICAL DECOMPRESSION/DISCECTOMY FUSION 1 LEVEL;  Surgeon: [name], MD;  Location: [place] [facility]S;  Service: Neurosurgery;  Laterality: N/A;  Anterior Cervical Decompression/discectomy Fusion. Cervical three-four.  Marland Kitchen CARDIAC CATHETERIZATION N/A 01/01/2015   Procedure: Left Heart Cath  and Coronary Angiography;  Surgeon: [name], MD;  Location: [place] CV LAB;  Service: Cardiovascular;  Laterality: N/A;  . CARDIAC CATHETERIZATION N/A 04/05/2016   Procedure: Left Heart Cath and Coronary Angiography;  Surgeon: Belva Crome, MD;  Location: [place] CV LAB;  Service: Cardiovascular;  Laterality: N/A;  . CARDIAC CATHETERIZATION N/A 04/05/2016   Procedure: Coronary Stent Intervention;  Surgeon: Belva Crome, MD;  Location: [place] CV LAB;  Service: Cardiovascular;  Laterality: N/A;  . CARDIAC CATHETERIZATION N/A 04/05/2016   Procedure: Intravascular Ultrasound/IVUS;  Surgeon: Belva Crome, MD;  Location: [place] CV LAB;  Service: Cardiovascular;  Laterality: N/A;  . NASAL SINUS SURGERY     2005   Family History:  Family History  Problem Relation Age of Onset  . Prostate cancer Father   . Hypertension Mother   . Kidney Stones Mother   . Anxiety disorder Mother   . Depression Mother   . COPD Sister   . Hypertension Sister   . Diabetes Sister   . Depression Sister   . Anxiety disorder Sister   . Seizures Sister   . ADD / ADHD Son   . ADD / ADHD Daughter    Family Psychiatric  History: denies Social History:  Social History   Substance and Sexual Activity  Alcohol Use No  . Alcohol/week: 0.0 oz   Comment: last drinked in 3 months.      Social History   Substance and Sexual Activity  Drug Use No    Social History   Socioeconomic History  . Marital status: Widowed    Spouse name: None  . Number of children: 3  . Years of education: 56  . Highest education level: None  Social Needs  . Financial resource strain: None  . Food insecurity - worry: None  . Food insecurity - inability: None  . Transportation needs - medical: None  . Transportation needs - non-medical: None  Occupational History    Comment: unemployed  Tobacco Use  . Smoking status: Current Every Day Smoker    Packs/day: 2.00    Years: 17.00    Pack years: 34.00     Types: Cigarettes, Cigars  . Smokeless tobacco: Former Systems developer  . Tobacco comment: occassional snuff   Substance and Sexual Activity  . Alcohol use: No    Alcohol/week: 0.0 oz    Comment: last drinked in 3 months.   . Drug use: No  . Sexual activity: Not Currently  Other Topics Concern  . None  Social History Narrative   Patient is right handed.   Patient drinks 2 sodas daily.   Additional Social History:                         Sleep: Fair  Appetite:  Fair  Current Medications: Current Facility-Administered Medications  Medication Dose Route Frequency Provider Last Rate Last Dose  . acetaminophen (TYLENOL) tablet 650 mg  650 mg Oral Q6H PRN Clapacs, Madie Reno, MD   650 mg at 03/13/17 1739  . alum & mag hydroxide-simeth (MAALOX/MYLANTA) 200-200-20 MG/5ML suspension 30 mL  30 mL Oral Q4H PRN Clapacs, John T, MD      . aspirin EC tablet 81 mg  81 mg Oral Daily Clapacs, Madie Reno, MD   81 mg at 03/14/17 0910  . atorvastatin (  LIPITOR) tablet 80 mg  80 mg Oral q1800 Clapacs, Madie Reno, MD   80 mg at 03/13/17 1739  . carvedilol (COREG) tablet 6.25 mg  6.25 mg Oral BID WC Clapacs, Madie Reno, MD   6.25 mg at 03/14/17 0910  . chlordiazePOXIDE (LIBRIUM) capsule 25 mg  25 mg Oral Q6H PRN McNew, Holly R, MD      . clopidogrel (PLAVIX) tablet 75 mg  75 mg Oral Daily Clapacs, Madie Reno, MD   75 mg at 03/14/17 0910  . divalproex (DEPAKOTE) DR tablet 1,500 mg  1,500 mg Oral QHS Clapacs, John T, MD   1,500 mg at 03/13/17 2133  . hydrOXYzine (ATARAX/VISTARIL) tablet 50 mg  50 mg Oral TID PRN McNew, Tyson Babinski, MD      . loperamide (IMODIUM) capsule 2-4 mg  2-4 mg Oral PRN McNew, Tyson Babinski, MD      . lurasidone (LATUDA) tablet 80 mg  80 mg Oral Q breakfast McNew, Tyson Babinski, MD   80 mg at 03/14/17 0910  . magnesium hydroxide (MILK OF MAGNESIA) suspension 30 mL  30 mL Oral Daily PRN Clapacs, John T, MD      . metFORMIN (GLUCOPHAGE) tablet 500 mg  500 mg Oral BID WC Clapacs, Madie Reno, MD   500 mg at 03/14/17 0910  .  mirtazapine (REMERON) tablet 45 mg  45 mg Oral QHS Clapacs, Madie Reno, MD   45 mg at 03/13/17 2133  . multivitamin with minerals tablet 1 tablet  1 tablet Oral Daily McNew, Tyson Babinski, MD   1 tablet at 03/14/17 0910  . nicotine (NICODERM CQ - dosed in mg/24 hours) patch 21 mg  21 mg Transdermal Daily McNew, Tyson Babinski, MD   21 mg at 03/14/17 0910  . ondansetron (ZOFRAN-ODT) disintegrating tablet 4 mg  4 mg Oral Q6H PRN McNew, Holly R, MD      . prazosin (MINIPRESS) capsule 2 mg  2 mg Oral QHS Clapacs, Madie Reno, MD   2 mg at 03/13/17 2132  . thiamine (VITAMIN B-1) tablet 100 mg  100 mg Oral Daily McNew, Tyson Babinski, MD   100 mg at 03/14/17 0910  . traZODone (DESYREL) tablet 100 mg  100 mg Oral QHS PRN Clapacs, Madie Reno, MD   100 mg at 03/12/17 2059  . traZODone (DESYREL) tablet 150 mg  150 mg Oral QHS McNew, Tyson Babinski, MD   150 mg at 03/13/17 2132  . venlafaxine XR (EFFEXOR-XR) 24 hr capsule 225 mg  225 mg Oral Q breakfast Clapacs, John T, MD   225 mg at 03/14/17 0909  . zolpidem (AMBIEN) tablet 5 mg  5 mg Oral QHS McNew, Tyson Babinski, MD   5 mg at 03/13/17 2132    Lab Results:  Results for orders placed or performed during the hospital encounter of 03/12/17 (from the past 48 hour(s))  Hemoglobin A1c     Status: Abnormal   Collection Time: 03/13/17  6:33 AM  Result Value Ref Range   Hgb A1c MFr Bld 6.0 (H) 4.8 - 5.6 %    Comment: (NOTE)         Prediabetes: 5.7 - 6.4         Diabetes: >6.4         Glycemic control for adults with diabetes: <7.0    Mean Plasma Glucose 126 mg/dL    Comment: (NOTE) Performed At: [facility] [place], [place] [id number] Rush Farmer MD [id number]   Lipid panel  Status: Abnormal   Collection Time: 03/13/17  6:33 AM  Result Value Ref Range   Cholesterol 118 0 - 200 mg/dL   Triglycerides 325 (H) <150 mg/dL   HDL 31 (L) >40 mg/dL   Total CHOL/HDL Ratio 3.8 RATIO   VLDL 65 (H) 0 - 40 mg/dL   LDL Cholesterol 22 0 - 99 mg/dL    Comment:        Total  Cholesterol/HDL:CHD Risk Coronary Heart Disease Risk Table                     Men   Women  1/2 Average Risk   3.4   3.3  Average Risk       5.0   4.4  2 X Average Risk   9.6   7.1  3 X Average Risk  23.4   11.0        Use the calculated Patient Ratio above and the CHD Risk Table to determine the patient's CHD Risk.        ATP III CLASSIFICATION (LDL):  <100     mg/dL   Optimal  100-129  mg/dL   Near or Above                    Optimal  130-159  mg/dL   Borderline  160-189  mg/dL   High  >190     mg/dL   Very High   TSH     Status: None   Collection Time: 03/13/17  6:33 AM  Result Value Ref Range   TSH 1.022 0.350 - 4.500 uIU/mL    Comment: Performed by a 3rd Generation assay with a functional sensitivity of <=0.01 uIU/mL.    Blood Alcohol level:  Lab Results  Component Value Date   ETH 37 (H) 03/12/2017   ETH <10 96/06/5407    Metabolic Disorder Labs: Lab Results  Component Value Date   HGBA1C 6.0 (H) 03/13/2017   MPG 126 03/13/2017   MPG 177 09/12/2016   Lab Results  Component Value Date   PROLACTIN 18.1 (H) 09/12/2016   PROLACTIN 6.5 05/27/2016   Lab Results  Component Value Date   CHOL 118 03/13/2017   TRIG 325 (H) 03/13/2017   HDL 31 (L) 03/13/2017   CHOLHDL 3.8 03/13/2017   VLDL 65 (H) 03/13/2017   LDLCALC 22 03/13/2017   LDLCALC 37 09/12/2016    Physical Findings: AIMS: Facial and Oral Movements Muscles of Facial Expression: Minimal Lips and Perioral Area: None, normal Jaw: None, normal Tongue: None, normal,Extremity Movements Upper (arms, wrists, hands, fingers): None, normal Lower (legs, knees, ankles, toes): None, normal, Trunk Movements Neck, shoulders, hips: Minimal, Overall Severity Severity of abnormal movements (highest score from questions above): None, normal Incapacitation due to abnormal movements: Minimal Patient's awareness of abnormal movements (rate only patient's report): Aware, mild distress, Dental Status Current problems  with teeth and/or dentures?: No Does patient usually wear dentures?: No  CIWA:  CIWA-Ar Total: 2 COWS:  COWS Total Score: 2  Musculoskeletal: Strength & Muscle Tone: within normal limits Gait & Station: normal Patient leans: N/A  Psychiatric Specialty Exam: Physical Exam  Constitutional: He is oriented to person, place, and time. He appears well-developed and well-nourished.  HENT:  Head: Normocephalic.  Neck: Normal range of motion.  Respiratory: Effort normal.  Musculoskeletal: Normal range of motion.  Neurological: He is alert and oriented to person, place, and time.  Psychiatric: His speech is normal. Judgment normal. He is  withdrawn. Cognition and memory are normal. He exhibits a depressed mood. He expresses suicidal ideation.    Review of Systems  Psychiatric/Behavioral: Positive for depression, substance abuse and suicidal ideas.  All other systems reviewed and are negative.   Blood pressure (!) 134/98, pulse 95, temperature 97.9 F (36.6 C), temperature source Oral, resp. rate 18, height 6\' 2"  (1.88 m), weight 88.5 kg (195 lb), SpO2 96 %.Body mass index is 25.04 kg/m.  General Appearance: Casual  Eye Contact:  Fair  Speech:  Normal Rate  Volume:  Decreased  Mood:  Depressed  Affect:  Congruent  Thought Process:  Coherent and Descriptions of Associations: Intact  Orientation:  Full (Time, Place, and Person)  Thought Content:  Rumination  Suicidal Thoughts:  Yes.  without intent/plan  Homicidal Thoughts:  No  Memory:  Immediate;   Fair Recent;   Fair Remote;   Fair  Judgement:  Impaired  Insight:  Fair  Psychomotor Activity:  Decreased  Concentration:  Concentration: Fair and Attention Span: Fair  Recall:  [employer] of Knowledge:  Fair  Language:  Good  Akathisia:  No  Handed:  Right  AIMS (if indicated):     Assets:  Housing Leisure Time Physical Health Resilience Social Support  ADL's:  Intact  Cognition:  WNL  Sleep:  Number of Hours: 7.15      Treatment Plan Summary: Daily contact with patient to assess and evaluate symptoms and progress in treatment, Medication management and Plan major depressive disorder, recurrent, severe without psychosis:   Plan:   -Continue Effexor 225 mg daily for depression -Continue Latuda 80 mg daily for depression -Continue Depakote 1500 mg at bedtime for mood stabilization  Insomnia Plan: -Continue Trazodone 150 mg at bedtime for sleep -Continue Ambien 5 mg at bedtime for sleep -Continue Remeron 45 mg at bedtime for sleep and depression  Alcohol dependence Plan: -Continue Librium 25 mg every six hours PRN alcohol withdrawal symptoms  LORD, JAMISON, NP 03/14/2017, 2:35 PM

## 2017-03-14 NOTE — BHH Group Notes (Signed)
[place] Group Notes:  (Nursing/MHT/Case Management/Adjunct)  Date:  03/14/2017  Time:  5:50 PM  Type of Therapy:  Psychoeducational Skills  Participation Level:  Did Not Attend   Michael Schmitt 03/14/2017, 5:50 PM            Michael Schmitt 03/14/2017, 6:16 PM

## 2017-03-14 NOTE — Progress Notes (Signed)
Recreation Therapy Notes  INPATIENT RECREATION THERAPY ASSESSMENT  Patient Details Name: Michael Schmitt MRN: [id number] DOB: Apr 15, 1970 Today's Date: 03/14/2017 Patient states that he wants residential treatment for his substance abuse. He stated he is feel ing suicidal but can contract for safety while he is here and has no plan while here.  Patient Stressors: Other (Comment)(SI)  Coping Skills:   (Read bible)  Personal Challenges: Anger, Communication, Concentration, Decision-Making, Expressing Yourself, Problem-Solving, Self-Esteem/Confidence, Social Interaction, Stress Management, Substance Abuse, Time Management, Trusting Others  Leisure Interests (2+):  (Nothing)  Awareness of Community Resources:  Yes  Community Resources:  Church  Current Use: Yes  If no, Barriers?:    Patient Strengths:  Good Father  Patient Identified Areas of Improvement:  Everything  Current Recreation Participation:  None  Patient Goal for Hospitalization:  To get better  [place] of Residence:  [place] of Residence:  [place]   Current SI (including self-harm):  Yes  Current HI:  No  Consent to Intern Participation: N/A   Michael Schmitt 03/14/2017, 3:58 PM

## 2017-03-14 NOTE — Progress Notes (Signed)
D: Pt denies HI/AVH, Passive SI-contracts for safety at this time . Pt is pleasant and cooperative. Depressed about his situation his pain in his neck and his ETOH abuse. Pt wanted to go to a LT Tx facility for his ETOH. Pt stated he was doing about the same as when he came in as far as how he was feeling overall.   A: Pt was offered support and encouragement. Pt was given scheduled medications. Pt was encourage to attend groups. Q 15 minute checks were done for safety.   R:Pt attends groups and interacts well with peers and staff. Pt is taking medication. Pt receptive to treatment and safety maintained on unit.

## 2017-03-14 NOTE — BHH Group Notes (Signed)
  03/14/2017  Time: 0930  Type of Therapy/Topic:  Group Therapy:  Emotion Regulation  Participation Level:  Minimal   Description of Group:    The purpose of this group is to assist patients in learning to regulate negative emotions and experience positive emotions. Patients will be guided to discuss ways in which they have been vulnerable to their negative emotions. These vulnerabilities will be juxtaposed with experiences of positive emotions or situations, and patients will be challenged to use positive emotions to combat negative ones. Special emphasis will be placed on coping with negative emotions in conflict situations, and patients will process healthy conflict resolution skills.  Therapeutic Goals: 1. Patient will identify two positive emotions or experiences to reflect on in order to balance out negative emotions 2. Patient will label two or more emotions that they find the most difficult to experience 3. Patient will demonstrate positive conflict resolution skills through discussion and/or role plays  Summary of Patient Progress: Pt continues to work towards their tx goals but has not yet reached them. Pt was able to appropriately participate in group discussion, and was able to offer support/validation to other group members. Pt reported that two things he can do to stay safe and calm in the moment, and to effectively regulate his emotions are, "reading and exercise."     Therapeutic Modalities:   Cognitive Behavioral Therapy Feelings Identification Dialectical Behavioral Therapy   Alden Hipp, MSW, LCSW 03/14/2017 10:36 AM

## 2017-03-14 NOTE — Tx Team (Signed)
Interdisciplinary Treatment and Diagnostic Plan Update  03/14/2017 Time of Session: 11:00am NACHMAN SUNDT MRN: [id number]  Principal Diagnosis: Severe recurrent major depression without psychotic features [facility])  Secondary Diagnoses: Principal Problem:   Severe recurrent major depression without psychotic features ([place])   Current Medications:  Current Facility-Administered Medications  Medication Dose Route Frequency Provider Last Rate Last Dose  . acetaminophen (TYLENOL) tablet 650 mg  650 mg Oral Q6H PRN Clapacs, Madie Reno, MD   650 mg at 03/13/17 1739  . alum & mag hydroxide-simeth (MAALOX/MYLANTA) 200-200-20 MG/5ML suspension 30 mL  30 mL Oral Q4H PRN Clapacs, John T, MD      . aspirin EC tablet 81 mg  81 mg Oral Daily Clapacs, John T, MD   81 mg at 03/14/17 0910  . atorvastatin (LIPITOR) tablet 80 mg  80 mg Oral q1800 Clapacs, John T, MD   80 mg at 03/13/17 1739  . carvedilol (COREG) tablet 6.25 mg  6.25 mg Oral BID WC Clapacs, Madie Reno, MD   6.25 mg at 03/14/17 0910  . chlordiazePOXIDE (LIBRIUM) capsule 25 mg  25 mg Oral Q6H PRN McNew, Holly R, MD      . clopidogrel (PLAVIX) tablet 75 mg  75 mg Oral Daily Clapacs, Madie Reno, MD   75 mg at 03/14/17 0910  . divalproex (DEPAKOTE) DR tablet 1,500 mg  1,500 mg Oral QHS Clapacs, John T, MD   1,500 mg at 03/13/17 2133  . hydrOXYzine (ATARAX/VISTARIL) tablet 50 mg  50 mg Oral TID PRN McNew, Tyson Babinski, MD      . loperamide (IMODIUM) capsule 2-4 mg  2-4 mg Oral PRN McNew, Tyson Babinski, MD      . lurasidone (LATUDA) tablet 80 mg  80 mg Oral Q breakfast McNew, Tyson Babinski, MD   80 mg at 03/14/17 0910  . magnesium hydroxide (MILK OF MAGNESIA) suspension 30 mL  30 mL Oral Daily PRN Clapacs, John T, MD      . metFORMIN (GLUCOPHAGE) tablet 500 mg  500 mg Oral BID WC Clapacs, Madie Reno, MD   500 mg at 03/14/17 0910  . mirtazapine (REMERON) tablet 45 mg  45 mg Oral QHS Clapacs, Madie Reno, MD   45 mg at 03/13/17 2133  . multivitamin with minerals tablet 1 tablet  1 tablet  Oral Daily McNew, Tyson Babinski, MD   1 tablet at 03/14/17 0910  . nicotine (NICODERM CQ - dosed in mg/24 hours) patch 21 mg  21 mg Transdermal Daily McNew, Tyson Babinski, MD   21 mg at 03/14/17 0910  . ondansetron (ZOFRAN-ODT) disintegrating tablet 4 mg  4 mg Oral Q6H PRN McNew, Holly R, MD      . prazosin (MINIPRESS) capsule 2 mg  2 mg Oral QHS Clapacs, Madie Reno, MD   2 mg at 03/13/17 2132  . thiamine (VITAMIN B-1) tablet 100 mg  100 mg Oral Daily McNew, Tyson Babinski, MD   100 mg at 03/14/17 0910  . traZODone (DESYREL) tablet 100 mg  100 mg Oral QHS PRN Clapacs, Madie Reno, MD   100 mg at 03/12/17 2059  . traZODone (DESYREL) tablet 150 mg  150 mg Oral QHS McNew, Tyson Babinski, MD   150 mg at 03/13/17 2132  . venlafaxine XR (EFFEXOR-XR) 24 hr capsule 225 mg  225 mg Oral Q breakfast Clapacs, John T, MD   225 mg at 03/14/17 0909  . zolpidem (AMBIEN) tablet 5 mg  5 mg Oral QHS McNew, Tyson Babinski, MD  5 mg at 03/13/17 2132   PTA Medications: Medications Prior to Admission  Medication Sig Dispense Refill Last Dose  . aspirin EC 81 MG EC tablet Take 1 tablet (81 mg total) by mouth daily. 30 tablet 0 Taking  . atorvastatin (LIPITOR) 80 MG tablet Take 1 tablet (80 mg total) by mouth daily at 6 PM. 30 tablet 0 Taking  . carvedilol (COREG) 6.25 MG tablet Take 1 tablet (6.25 mg total) by mouth 2 (two) times daily with a meal. 60 tablet 0 Taking  . clopidogrel (PLAVIX) 75 MG tablet Take 1 tablet (75 mg total) by mouth daily. 30 tablet 0 Taking  . divalproex (DEPAKOTE) 500 MG DR tablet Take 500 mg by mouth 2 (two) times daily.   Taking  . gabapentin (NEURONTIN) 400 MG capsule Take 2 capsules (800 mg total) by mouth 4 (four) times daily. 240 capsule 0 Taking  . hydrOXYzine (ATARAX/VISTARIL) 50 MG tablet Take 50 mg by mouth 3 (three) times daily as needed.   Taking  . metFORMIN (GLUCOPHAGE) 500 MG tablet Take 1 tablet (500 mg total) by mouth 2 (two) times daily with a meal. 60 tablet 0 Taking  . mirtazapine (REMERON) 45 MG tablet Take 1  tablet (45 mg total) by mouth at bedtime. 30 tablet 0 Taking  . nitroGLYCERIN (NITROSTAT) 0.4 MG SL tablet Place 1 tablet (0.4 mg total) under the tongue every 5 (five) minutes x 3 doses as needed. 10 tablet 0 Taking  . prazosin (MINIPRESS) 2 MG capsule Take 2 mg by mouth at bedtime.   Taking  . risperiDONE (RISPERDAL) 2 MG tablet Take 2 mg by mouth at bedtime.   Taking  . venlafaxine XR (EFFEXOR-XR) 75 MG 24 hr capsule Take 3 capsules (225 mg total) daily with breakfast by mouth. 90 capsule 1     Patient Stressors: Health problems Medication change or noncompliance Occupational concerns Substance abuse  Patient Strengths: Average or above average intelligence Capable of independent living Communication skills Motivation for treatment/growth Supportive family/friends  Treatment Modalities: Medication Management, Group therapy, Case management,  1 to 1 session with clinician, Psychoeducation, Recreational therapy.   Physician Treatment Plan for Primary Diagnosis: Severe recurrent major depression without psychotic features ([place]) Long Term Goal(s): Improvement in symptoms so as ready for discharge   Short Term Goals: Ability to disclose and discuss suicidal ideas Ability to demonstrate self-control will improve  Medication Management: Evaluate patient's response, side effects, and tolerance of medication regimen.  Therapeutic Interventions: 1 to 1 sessions, Unit Group sessions and Medication administration.  Evaluation of Outcomes: Progressing  Physician Treatment Plan for Secondary Diagnosis: Principal Problem:   Severe recurrent major depression without psychotic features ([place])  Long Term Goal(s): Improvement in symptoms so as ready for discharge   Short Term Goals: Ability to disclose and discuss suicidal ideas Ability to demonstrate self-control will improve     Medication Management: Evaluate patient's response, side effects, and tolerance of medication  regimen.  Therapeutic Interventions: 1 to 1 sessions, Unit Group sessions and Medication administration.  Evaluation of Outcomes: Progressing   RN Treatment Plan for Primary Diagnosis: Severe recurrent major depression without psychotic features ([place]) Long Term Goal(s): Knowledge of disease and therapeutic regimen to maintain health will improve  Short Term Goals: Ability to participate in decision making will improve, Ability to identify and develop effective coping behaviors will improve and Compliance with prescribed medications will improve  Medication Management: RN will administer medications as ordered by provider, will assess and evaluate patient's response  and provide education to patient for prescribed medication. RN will report any adverse and/or side effects to prescribing provider.  Therapeutic Interventions: 1 on 1 counseling sessions, Psychoeducation, Medication administration, Evaluate responses to treatment, Monitor vital signs and CBGs as ordered, Perform/monitor CIWA, COWS, AIMS and Fall Risk screenings as ordered, Perform wound care treatments as ordered.  Evaluation of Outcomes: Progressing   LCSW Treatment Plan for Primary Diagnosis: Severe recurrent major depression without psychotic features ([place]) Long Term Goal(s): Safe transition to appropriate next level of care at discharge, Engage patient in therapeutic group addressing interpersonal concerns.  Short Term Goals: Engage patient in aftercare planning with referrals and resources, Identify triggers associated with mental health/substance abuse issues and Increase skills for wellness and recovery  Therapeutic Interventions: Assess for all discharge needs, 1 to 1 time with Social worker, Explore available resources and support systems, Assess for adequacy in community support network, Educate family and significant other(s) on suicide prevention, Complete Psychosocial Assessment, Interpersonal group  therapy.  Evaluation of Outcomes: Progressing   Progress in Treatment: Attending groups: No. Participating in groups: No. Taking medication as prescribed: Yes. Toleration medication: Yes. Family/Significant other contact made: No, will contact:    Patient understands diagnosis: Yes. Discussing patient identified problems/goals with staff: Yes. Medical problems stabilized or resolved: Yes. Denies suicidal/homicidal ideation: Yes. Issues/concerns per patient self-inventory: No. Other:    New problem(s) identified: No, Describe:     New Short Term/Long Term Goal(s): to go to residential treatment program   Discharge Plan or Barriers: CSW still assessing for appropriate referrals.  Reason for Continuation of Hospitalization: Depression Medication stabilization Suicidal ideation  Estimated Length of Stay: 3-5 days  Recreational Therapy: Patient Stressors: SI Patient Goal: Patient will identify 3 healthy leisure activities to participate in post discharge x5 days.  Attendees: Patient:Jeffrey Cockrell 03/14/2017 2:55 PM  Physician: Amador Cunas, MD 03/14/2017 2:55 PM  Nursing: Polly Cobia, RN 03/14/2017 2:55 PM  RN Care Manager: 03/14/2017 2:55 PM  Social Worker: Dossie Arbour, LCSW 03/14/2017 2:55 PM  Recreational Therapist: Roanna Epley, LRT 03/14/2017 2:55 PM  Other:  03/14/2017 2:55 PM  Other:  03/14/2017 2:55 PM  Other: 03/14/2017 2:55 PM    Scribe for Treatment Team: August Saucer, LCSW 03/14/2017 2:55 PM

## 2017-03-15 MED ORDER — LURASIDONE HCL 40 MG PO TABS
40.0000 mg | ORAL_TABLET | Freq: Every day | ORAL | Status: DC
  Administered 2017-03-16 – 2017-03-20 (×5): 40 mg via ORAL
  Filled 2017-03-15 (×5): qty 1

## 2017-03-15 MED ORDER — MIRTAZAPINE 15 MG PO TABS
15.0000 mg | ORAL_TABLET | Freq: Every day | ORAL | Status: DC
  Administered 2017-03-15 – 2017-03-17 (×3): 15 mg via ORAL
  Filled 2017-03-15 (×3): qty 1

## 2017-03-15 MED ORDER — ZOLPIDEM TARTRATE 5 MG PO TABS
10.0000 mg | ORAL_TABLET | Freq: Every day | ORAL | Status: DC
  Administered 2017-03-15 – 2017-03-16 (×2): 10 mg via ORAL
  Filled 2017-03-15 (×2): qty 2

## 2017-03-15 MED ORDER — VENLAFAXINE HCL ER 75 MG PO CP24
150.0000 mg | ORAL_CAPSULE | Freq: Every day | ORAL | Status: DC
  Administered 2017-03-16 – 2017-03-18 (×3): 150 mg via ORAL
  Filled 2017-03-15 (×3): qty 2

## 2017-03-15 MED ORDER — ESCITALOPRAM OXALATE 10 MG PO TABS
10.0000 mg | ORAL_TABLET | Freq: Every day | ORAL | Status: DC
  Administered 2017-03-15 – 2017-03-17 (×3): 10 mg via ORAL
  Filled 2017-03-15 (×3): qty 1

## 2017-03-15 NOTE — Progress Notes (Signed)
D:Pt. Reports passive suicidal ideations this evening with no plan. Pt able to verbally contract for safety and states, "I can remain safe while on the unit". Pt today reports feeling, "about the same" today when asked if he's feeling better, worse, or the same. Pt denies AVH and denies HI. Pt this evening reports anxiety and depression at an 8 for both. Pt reports eating and sleeping, "good".  A: Q x 15 minute observation checks were completed for safety. Patient was provided with education. Patient was given scheduled medications. Patient  was encourage to attend groups, participate in unit activities and continue with plan of care.   R:Patient is complaint with medication and meals, but does not attend groups. Pt this evening more active in the milieu and periodically seen socializing with other patients. Pt not able to comply with EKG this evening.             Patient slept for Estimated Hours of 7.30; Precautionary checks every 15 minutes for safety maintained, room free of safety hazards, patient sustains no injury or falls during this shift.

## 2017-03-15 NOTE — [facility] Group Notes (Signed)
  [place] Group Notes:  (Nursing/MHT/Case Management/Adjunct)  Date:  03/15/2017  Time:  1:38 AM  Type of Therapy:  Group Therapy  Participation Level:  Did Not Attend  Participation Quality Summary of Progress/Problems:  Michael Schmitt 03/15/2017, 1:38 AM

## 2017-03-15 NOTE — Progress Notes (Signed)
Recreation Therapy Notes  Date: 12.20.2018  Time: 9:30 am  Location: Craft Room  Behavioral response: N/A  Intervention Topic: Time Management  Discussion/Intervention: Patient did not attend group. Clinical Observations/Feedback:  Patient did not attend group. Michael Schmitt LRT/CTRS         [name] 03/15/2017 12:44 PM

## 2017-03-15 NOTE — Progress Notes (Signed)
Pt. States having an "ok" day.  Pt. States he went to group today but did not talk to therapist today.  Pt. Appears to be in a good mood.  Pt. Concerned about getting sleep at night.  Pt. Told he would be getting a sleep aid.

## 2017-03-15 NOTE — [facility] Group Notes (Signed)
03/15/2017  Time: 1:00PM  Type of Therapy/Topic:  Group Therapy:  Balance in Life  Participation Level:  Minimal  Description of Group:   This group will address the concept of balance and how it feels and looks when one is unbalanced. Patients will be encouraged to process areas in their lives that are out of balance and identify reasons for remaining unbalanced. Facilitators will guide patients in utilizing problem-solving interventions to address and correct the stressor making their life unbalanced. Understanding and applying boundaries will be explored and addressed for obtaining and maintaining a balanced life. Patients will be encouraged to explore ways to assertively make their unbalanced needs known to significant others in their lives, using other group members and facilitator for support and feedback.  Therapeutic Goals: 1. Patient will identify two or more emotions or situations they have that consume much of in their lives. 2. Patient will identify signs/triggers that life has become out of balance:  3. Patient will identify two ways to set boundaries in order to achieve balance in their lives:  4. Patient will demonstrate ability to communicate their needs through discussion and/or role plays  Summary of Patient Progress: Pt continues to work towards their tx goals but has not yet reached them. Pt was able to appropriately participate in group discussion, and was able to offer support/validation to other group members.  Pt reported two areas of his life he spends too much time on are, "financial and mental." Pt reported that one area of his life he'd like to devote more attention to is, "physical health."    Therapeutic Modalities:   Cognitive Behavioral Therapy Solution-Focused Therapy Assertiveness Training  Alden Hipp, MSW, LCSW 03/15/2017 1:53 PM

## 2017-03-15 NOTE — Plan of Care (Signed)
Continues to endorse passive SI.  Minimal interaction with peers and staff.  Support offered.  Safety rounds maintained.

## 2017-03-15 NOTE — Plan of Care (Signed)
Pt able to verbally contract for safety and states, "I can remain safe while on the unit". Pt verbalizes understanding of provided education. Pt able to talk about feelings and passive suicidal ideations. Pt able to remain safe while on the unit. Pt more active in the milieu this evening up for snacks and socializing with peers.

## 2017-03-15 NOTE — [facility] Group Notes (Signed)
[place] Group Notes:  (Nursing/MHT/Case Management/Adjunct)  Date:  03/15/2017  Time:  5:43 PM  Type of Therapy:  Psychoeducational Skills  Participation Level:  Minimal  Participation Quality:  Attentive  Affect:  Irritable  Cognitive:  Lacking  Insight:  Limited  Engagement in Group:  Limited  Modes of Intervention:  Discussion and Education  Summary of Progress/Problems:  Michael Schmitt 03/15/2017, 5:43 PM

## 2017-03-15 NOTE — Progress Notes (Addendum)
Recreation Therapy Notes  Date: 12.20 .2018  Time: 3:00pm  Location: Craft room  Behavioral response: Appropriate  Group Type: Craft  Participation level: Active  Communication: Patient was social with peers and staff.  Comments: N/A  Brannon Decaire LRT/CTRS        Tiyana Galla 03/15/2017 4:29 PM

## 2017-03-15 NOTE — Progress Notes (Signed)
[facility] MD Progress Note  03/15/2017 2:51 PM Michael Schmitt  MRN:  [id number] Subjective:  46 yo male who presented to the ED with suicidal ideations.  He continues to endorse passive suicidal ideations and a 10/10 mood.  Encouraged him to redirect his thoughts away from the suicidal ideations and the continuation of counseling after discharge to assist with this process.  His baseline appears to be suicidal with depression which was discussed with him.  He does report Trazodone never working for him and his MD wanting to get him off of Latuda, adjustments made.  Reiterated medications would assist him but he needed the therapy to get off the negative thinking.   Principal Problem: Severe recurrent major depression without psychotic features ([place]) Diagnosis:   Patient Active Problem List   Diagnosis Date Noted  . Severe recurrent major depression without psychotic features ([place]) [F33.2] 03/12/2017    Priority: High  . Generalized anxiety disorder [F41.1] 02/05/2017  . Chronic neck pain (Primary Area of Pain) (Left) [M54.2, G89.29] 11/22/2016  . Cervical facet syndrome (Left) [F57.322] 11/22/2016  . Cervical radiculitis (Left) [M54.12] 11/22/2016  . History of cervical spinal surgery [Z98.890] 11/22/2016  . Cervical spondylosis [M47.22] 11/22/2016  . Chronic shoulder pain (Secondary Area of Pain) (Left) [M25.512, G89.29] 11/22/2016  . 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 Chronic low back pain [facility] Area of Pain) (Bilateral) (L>R) [M54.5, G89.29] 11/22/2016  . Lumbar facet syndrome (Bilateral) (L>R) [M47.816] 11/22/2016  . Chronic pain syndrome [G89.4] 11/21/2016  . Long term prescription benzodiazepine use [Z79.899] 11/21/2016  . Opiate use [F11.90] 11/21/2016  . Uncontrolled type 2 diabetes mellitus without complication, without long-term current use of insulin ([place]) [E11.65] 10/19/2016  . Chronic Suicidal ideation [R45.851] 09/15/2016  . Coronary artery disease  [I25.10] 09/12/2016  . HTN (hypertension) [I10] 06/26/2016  . GERD (gastroesophageal reflux disease) [K21.9] 06/26/2016  . Alcohol use disorder, severe, 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[place]) [F10.21] 06/26/2016  . Moderate benzodiazepine use disorder ([place]) [F13.20] 06/11/2016  . Sedative, hypnotic or anxiolytic use disorder, severe, dependence ([place]) [F13.20] 05/12/2016  . Ventricular fibrillation ([place]) [I49.01] 04/05/2016  . 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 Cervical spondylosis without myelopathy [M47.812] 07/24/2014  . Severe episode of recurrent major depressive disorder, without psychotic features ([place]) [F33.2] 04/15/2013  . Tobacco use disorder [F17.200] 10/20/2010  . Asthma [J45.909] 10/20/2010   Total Time spent with patient: 30 minutes  Past Psychiatric History: depression, alcohol abuse  Past Medical History:  Past Medical History:  Diagnosis Date  . Anxiety   . Anxiety   . Arthritis    cerv. stenosis, spondylosis, HNP- lower back , has been followed in pain clinic, has  had injection s in cerv. area  . Blood dyscrasia    told that when he was young he was a" free bleeder"  . CAD (coronary artery disease)   . Cervical spondylosis without myelopathy 07/24/2014  . Cervicogenic headache 07/24/2014  . Chronic kidney disease    renal calculi- passed spontaneously  . Depression   . Diabetes mellitus without complication ([place])   . Fatty liver   . GERD (gastroesophageal reflux disease)   . Headache(784.0)   . Hyperlipidemia   . Hypertension   . Mental disorder   . MI, old   . RLS (restless legs syndrome)    detected on sleep study  . Shortness of breath   . Ventricular fibrillation ([place]) 04/05/2016    Past Surgical History:  Procedure Laterality Date  . ANTERIOR CERVICAL DECOMP/DISCECTOMY  FUSION  11/13/2011   Procedure: ANTERIOR CERVICAL DECOMPRESSION/DISCECTOMY FUSION 1 LEVEL;  Surgeon: [name], MD;  Location: [place] [facility];  Service: Neurosurgery;  Laterality: N/A;   Anterior Cervical Decompression/discectomy Fusion. Cervical three-four.  Marland Kitchen CARDIAC CATHETERIZATION N/A 01/01/2015   Procedure: Left Heart Cath and Coronary Angiography;  Surgeon: [name], MD;  Location: [place] CV LAB;  Service: Cardiovascular;  Laterality: N/A;  . CARDIAC CATHETERIZATION N/A 04/05/2016   Procedure: Left Heart Cath and Coronary Angiography;  Surgeon: Belva Crome, MD;  Location: [place] CV LAB;  Service: Cardiovascular;  Laterality: N/A;  . CARDIAC CATHETERIZATION N/A 04/05/2016   Procedure: Coronary Stent Intervention;  Surgeon: Belva Crome, MD;  Location: [place] CV LAB;  Service: Cardiovascular;  Laterality: N/A;  . CARDIAC CATHETERIZATION N/A 04/05/2016   Procedure: Intravascular Ultrasound/IVUS;  Surgeon: Belva Crome, MD;  Location: [place] CV LAB;  Service: Cardiovascular;  Laterality: N/A;  . NASAL SINUS SURGERY     2005   Family History:  Family History  Problem Relation Age of Onset  . Prostate cancer Father   . Hypertension Mother   . Kidney Stones Mother   . Anxiety disorder Mother   . Depression Mother   . COPD Sister   . Hypertension Sister   . Diabetes Sister   . Depression Sister   . Anxiety disorder Sister   . Seizures Sister   . ADD / ADHD Son   . ADD / ADHD Daughter    Family Psychiatric  History: none Social History:  Social History   Substance and Sexual Activity  Alcohol Use No  . Alcohol/week: 0.0 oz   Comment: last drinked in 3 months.      Social History   Substance and Sexual Activity  Drug Use No    Social History   Socioeconomic History  . Marital status: Widowed    Spouse name: None  . Number of children: 3  . Years of education: 46  . Highest education level: None  Social Needs  . Financial resource strain: None  . Food insecurity - worry: None  . Food insecurity - inability: None  . Transportation needs - medical: None  . Transportation needs - non-medical: None  Occupational History     Comment: unemployed  Tobacco Use  . Smoking status: Current Every Day Smoker    Packs/day: 2.00    Years: 17.00    Pack years: 34.00    Types: Cigarettes, Cigars  . Smokeless tobacco: Former Systems developer  . Tobacco comment: occassional snuff   Substance and Sexual Activity  . Alcohol use: No    Alcohol/week: 0.0 oz    Comment: last drinked in 3 months.   . Drug use: No  . Sexual activity: Not Currently  Other Topics Concern  . None  Social History Narrative   Patient is right handed.   Patient drinks 2 sodas daily.   Additional Social History:                         Sleep: Fair  Appetite:  Fair  Current Medications: Current Facility-Administered Medications  Medication Dose Route Frequency Provider Last Rate Last Dose  . acetaminophen (TYLENOL) tablet 650 mg  650 mg Oral Q6H PRN Clapacs, Madie Reno, MD   650 mg at 03/13/17 1739  . alum & mag hydroxide-simeth (MAALOX/MYLANTA) 200-200-20 MG/5ML suspension 30 mL  30 mL Oral Q4H PRN Clapacs, Madie Reno, MD      .  aspirin EC tablet 81 mg  81 mg Oral Daily Clapacs, Madie Reno, MD   81 mg at 03/15/17 0818  . atorvastatin (LIPITOR) tablet 80 mg  80 mg Oral q1800 Clapacs, Madie Reno, MD   80 mg at 03/14/17 1734  . carvedilol (COREG) tablet 6.25 mg  6.25 mg Oral BID WC Clapacs, Madie Reno, MD   6.25 mg at 03/15/17 0818  . chlordiazePOXIDE (LIBRIUM) capsule 25 mg  25 mg Oral Q6H PRN McNew, Holly R, MD      . clopidogrel (PLAVIX) tablet 75 mg  75 mg Oral Daily Clapacs, Madie Reno, MD   75 mg at 03/15/17 0818  . divalproex (DEPAKOTE) DR tablet 1,500 mg  1,500 mg Oral QHS Clapacs, John T, MD   1,500 mg at 03/14/17 2125  . hydrOXYzine (ATARAX/VISTARIL) tablet 50 mg  50 mg Oral TID PRN McNew, Tyson Babinski, MD      . loperamide (IMODIUM) capsule 2-4 mg  2-4 mg Oral PRN McNew, Tyson Babinski, MD      . lurasidone (LATUDA) tablet 80 mg  80 mg Oral Q breakfast McNew, Tyson Babinski, MD   80 mg at 03/15/17 0818  . magnesium hydroxide (MILK OF MAGNESIA) suspension 30 mL  30 mL Oral  Daily PRN Clapacs, John T, MD      . metFORMIN (GLUCOPHAGE) tablet 500 mg  500 mg Oral BID WC Clapacs, John T, MD   500 mg at 03/15/17 0818  . mirtazapine (REMERON) tablet 45 mg  45 mg Oral QHS Clapacs, Madie Reno, MD   45 mg at 03/14/17 2124  . multivitamin with minerals tablet 1 tablet  1 tablet Oral Daily McNew, Tyson Babinski, MD   1 tablet at 03/15/17 0818  . nicotine (NICODERM CQ - dosed in mg/24 hours) patch 21 mg  21 mg Transdermal Daily McNew, Tyson Babinski, MD   21 mg at 03/15/17 0818  . ondansetron (ZOFRAN-ODT) disintegrating tablet 4 mg  4 mg Oral Q6H PRN McNew, Holly R, MD      . prazosin (MINIPRESS) capsule 2 mg  2 mg Oral QHS Clapacs, Madie Reno, MD   2 mg at 03/14/17 2124  . thiamine (VITAMIN B-1) tablet 100 mg  100 mg Oral Daily McNew, Tyson Babinski, MD   100 mg at 03/15/17 0818  . traZODone (DESYREL) tablet 100 mg  100 mg Oral QHS PRN Clapacs, Madie Reno, MD   100 mg at 03/12/17 2059  . traZODone (DESYREL) tablet 150 mg  150 mg Oral QHS McNew, Tyson Babinski, MD   150 mg at 03/14/17 2125  . venlafaxine XR (EFFEXOR-XR) 24 hr capsule 225 mg  225 mg Oral Q breakfast Clapacs, John T, MD   225 mg at 03/15/17 0818  . zolpidem (AMBIEN) tablet 5 mg  5 mg Oral QHS McNew, Tyson Babinski, MD   5 mg at 03/14/17 2124    Lab Results: No results found for this or any previous visit (from the past 48 hour(s)).  Blood Alcohol level:  Lab Results  Component Value Date   ETH 37 (H) 03/12/2017   ETH <10 53/66/4403    Metabolic Disorder Labs: Lab Results  Component Value Date   HGBA1C 6.0 (H) 03/13/2017   MPG 126 03/13/2017   MPG 177 09/12/2016   Lab Results  Component Value Date   PROLACTIN 18.1 (H) 09/12/2016   PROLACTIN 6.5 05/27/2016   Lab Results  Component Value Date   CHOL 118 03/13/2017   TRIG 325 (H) 03/13/2017  HDL 31 (L) 03/13/2017   CHOLHDL 3.8 03/13/2017   VLDL 65 (H) 03/13/2017   LDLCALC 22 03/13/2017   LDLCALC 37 09/12/2016    Physical Findings: AIMS: Facial and Oral Movements Muscles of Facial  Expression: None, normal Lips and Perioral Area: None, normal Jaw: None, normal Tongue: None, normal,Extremity Movements Upper (arms, wrists, hands, fingers): None, normal Lower (legs, knees, ankles, toes): None, normal, Trunk Movements Neck, shoulders, hips: None, normal, Overall Severity Severity of abnormal movements (highest score from questions above): None, normal Incapacitation due to abnormal movements: None, normal Patient's awareness of abnormal movements (rate only patient's report): No Awareness, Dental Status Current problems with teeth and/or dentures?: No Does patient usually wear dentures?: No  CIWA:  CIWA-Ar Total: 0 COWS:  COWS Total Score: 2  Musculoskeletal: Strength & Muscle Tone: within normal limits Gait & Station: normal Patient leans: N/A  Psychiatric Specialty Exam: Physical Exam  Constitutional: He is oriented to person, place, and time. He appears well-developed and well-nourished.  HENT:  Head: Normocephalic.  Neck: Normal range of motion.  Respiratory: Effort normal.  Musculoskeletal: Normal range of motion.  Neurological: He is alert and oriented to person, place, and time.  Psychiatric: His speech is normal and behavior is normal. Judgment normal. Cognition and memory are normal. He exhibits a depressed mood. He expresses suicidal ideation.    Review of Systems  Psychiatric/Behavioral: Positive for depression, substance abuse and suicidal ideas.  All other systems reviewed and are negative.   Blood pressure 126/78, pulse (!) 53, temperature 98 F (36.7 C), temperature source Oral, resp. rate 18, height 6\' 2"  (1.88 m), weight 88.5 kg (195 lb), SpO2 96 %.Body mass index is 25.04 kg/m.  General Appearance: Casual  Eye Contact:  Good  Speech:  Normal Rate  Volume:  Normal  Mood:  Depressed  Affect:  Congruent  Thought Process:  Coherent and Descriptions of Associations: Intact  Orientation:  Full (Time, Place, and Person)  Thought Content:   Rumination  Suicidal Thoughts:  Yes.  without intent/plan  Homicidal Thoughts:  No  Memory:  Immediate;   Fair Recent;   Fair Remote;   Fair  Judgement:  Fair  Insight:  Fair  Psychomotor Activity:  Normal  Concentration:  Concentration: Fair and Attention Span: Fair  Recall:  [employer] of Knowledge:  Fair  Language:  Good  Akathisia:  No  Handed:  Right  AIMS (if indicated):     Assets:  Housing Leisure Time Physical Health Resilience Social Support  ADL's:  Intact  Cognition:  WNL  Sleep:  Number of Hours: 7.15    Treatment Plan Summary: 46 yo male with history of multiple admission, chronic depression and SI admitted due to SI and insomnia. Pt's main focus is insomnia and reports this has caused worsening depression. He has upcoming court hearing for DUI and needs to show the judge that he is making an effort for treatment. He is requesting residential or outpatient alcohol treatment. He reports chronic SI "everyday" for several years. He states that he will not act on it because of his children. He has had at least 10 hospitalization in the past year and has not had much benefit from that. He has poor response to medications. He does not have a daily routine and does not get out of the house which is likely contributing to insomnia (he sleeps in living room and hangs out there all day) and depression. HE also does not see a therapist which would  be of benefit for him. Will try to help him get some sleep while in the hospital since this is a primary concern for him.  Plan:  Mood disorder -Decrease Latuda 80 mg daily to 40 mg as he feels this made his depression worse, Effexor XR 225 mg daily decreased to 150 mg due to his request and MD wanting this (self report), and Remeron 45 mg qhs decreased to 15 mg since he is predominately using this for sleep (lower doses have this side effect more than the higher doses), Depakote 1500 mg qhs continued, Lexapro 10 mg daily for depression  started as he reports this helped him in the past and wanted to try it again  Insomnia -Trazodone 150 mg qhs discontinued per his request, does not feel this works -Ambien 5 mg qhs to 10 mg for sleep. He is aware that he will not be prescribed this outside of the hospital.  Prazosin 2 mg continued for nightmares  Alcohol abuse -will have CSW look into treatment options -discontinued his Librium due to no withdrawal effects   HTN -Carvedilol 6.25 mg BID  DM -Metformin 500 mg BID   Michael Elms, NP 03/15/2017, 2:51 PM

## 2017-03-15 NOTE — [facility] Group Notes (Signed)
LCSW Group Therapy Note 03/15/2017 9:00 AM  Type of Therapy and Topic:  Group Therapy:  Setting Goals  Participation Level:  Did Not Attend  Description of Group: In this process group, patients discussed using strengths to work toward goals and address challenges.  Patients identified two positive things about themselves and one goal they were working on.  Patients were given the opportunity to share openly and support each other's plan for self-empowerment.  The group discussed the value of gratitude and were encouraged to have a daily reflection of positive characteristics or circumstances.  Patients were encouraged to identify a plan to utilize their strengths to work on current challenges and goals.  Therapeutic Goals 1. Patient will verbalize personal strengths/positive qualities and relate how these can assist with achieving desired personal goals 2. Patients will verbalize affirmation of peers plans for personal change and goal setting 3. Patients will explore the value of gratitude and positive focus as related to successful achievement of goals 4. Patients will verbalize a plan for regular reinforcement of personal positive qualities and circumstances.  Summary of Patient Progress:       Therapeutic Modalities Cognitive Behavioral Therapy Motivational Interviewing    [name], [place] 03/15/2017 11:49 AM

## 2017-03-16 NOTE — Progress Notes (Signed)
Recreation Therapy Notes   Date: 12.21.2018  Time: 9:30 am  Location: Craft Room  Behavioral response: N/A  Intervention Topic: Leisure  Discussion/Intervention: Patient did not attend group Clinical Observations/Feedback:  Patient did not attend group.  Jakevious Hollister LRT/CTRS         Bertina Guthridge 03/16/2017 10:50 AM

## 2017-03-16 NOTE — Progress Notes (Signed)
[facility] MD Progress Note  03/16/2017 2:27 PM Michael Schmitt  MRN:  [id number] Subjective:  46 yo male who presented to the ED with suicidal ideations.  He continues to endorse passive suicidal ideations and a 10/10 mood.  Reports trying to redirect his thoughts but difficult for him.  Not very motivated or vested in getting in better, unfortunately.  Sleep was good last night, appetite is fair.  Continues to want to taper off of Latuda and Ambien (won't be given at discharge) while starting and tapering his Seroquel.  His goal is to get off the Effexor and increase his Lexapro, explained this would take awhile.  Focused on going to [place] for rehab. Principal Problem: Severe recurrent major depression without psychotic features ([place]) Diagnosis:   Patient Active Problem List   Diagnosis Date Noted  . Severe recurrent major depression without psychotic features ([place]) [F33.2] 03/12/2017    Priority: High  . Generalized anxiety disorder [F41.1] 02/05/2017  . Chronic neck pain (Primary Area of Pain) (Left) [M54.2, G89.29] 11/22/2016  . Cervical facet syndrome (Left) [U27.253] 11/22/2016  . Cervical radiculitis (Left) [M54.12] 11/22/2016  . History of cervical spinal surgery [Z98.890] 11/22/2016  . Cervical spondylosis [M47.22] 11/22/2016  . Chronic shoulder pain (Secondary Area of Pain) (Left) [M25.512, G89.29] 11/22/2016  . 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 Chronic low back pain [facility] Area of Pain) (Bilateral) (L>R) [M54.5, G89.29] 11/22/2016  . Lumbar facet syndrome (Bilateral) (L>R) [M47.816] 11/22/2016  . Chronic pain syndrome [G89.4] 11/21/2016  . Long term prescription benzodiazepine use [Z79.899] 11/21/2016  . Opiate use [F11.90] 11/21/2016  . Uncontrolled type 2 diabetes mellitus without complication, without long-term current use of insulin ([place]) [E11.65] 10/19/2016  . Chronic Suicidal ideation [R45.851] 09/15/2016  . Coronary artery disease [I25.10] 09/12/2016   . HTN (hypertension) [I10] 06/26/2016  . GERD (gastroesophageal reflux disease) [K21.9] 06/26/2016  . Alcohol use disorder, severe, 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[place]) [F10.21] 06/26/2016  . Moderate benzodiazepine use disorder ([place]) [F13.20] 06/11/2016  . Sedative, hypnotic or anxiolytic use disorder, severe, dependence ([place]) [F13.20] 05/12/2016  . Ventricular fibrillation ([place]) [I49.01] 04/05/2016  . 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 Cervical spondylosis without myelopathy [M47.812] 07/24/2014  . Severe episode of recurrent major depressive disorder, without psychotic features ([place]) [F33.2] 04/15/2013  . Tobacco use disorder [F17.200] 10/20/2010  . Asthma [J45.909] 10/20/2010   Total Time spent with patient: 30 minutes  Past Psychiatric History: depression, alcohol abuse  Past Medical History:  Past Medical History:  Diagnosis Date  . Anxiety   . Anxiety   . Arthritis    cerv. stenosis, spondylosis, HNP- lower back , has been followed in pain clinic, has  had injection s in cerv. area  . Blood dyscrasia    told that when he was young he was a" free bleeder"  . CAD (coronary artery disease)   . Cervical spondylosis without myelopathy 07/24/2014  . Cervicogenic headache 07/24/2014  . Chronic kidney disease    renal calculi- passed spontaneously  . Depression   . Diabetes mellitus without complication ([place])   . Fatty liver   . GERD (gastroesophageal reflux disease)   . Headache(784.0)   . Hyperlipidemia   . Hypertension   . Mental disorder   . MI, old   . RLS (restless legs syndrome)    detected on sleep study  . Shortness of breath   . Ventricular fibrillation ([place]) 04/05/2016    Past Surgical History:  Procedure Laterality Date  . ANTERIOR CERVICAL  DECOMP/DISCECTOMY FUSION  11/13/2011   Procedure: ANTERIOR CERVICAL DECOMPRESSION/DISCECTOMY FUSION 1 LEVEL;  Surgeon: [name], MD;  Location: [place] [facility]UR[facility] ORS;  Service: Neurosurgery;  Laterality: N/A;  Anterior Cervical  Decompression/discectomy Fusion. Cervical three-four.  Marland Kitchen CARDIAC CATHETERIZATION N/A 01/01/2015   Procedure: Left Heart Cath and Coronary Angiography;  Surgeon: [name], MD;  Location: [place] CV LAB;  Service: Cardiovascular;  Laterality: N/A;  . CARDIAC CATHETERIZATION N/A 04/05/2016   Procedure: Left Heart Cath and Coronary Angiography;  Surgeon: Belva Crome, MD;  Location: [place] CV LAB;  Service: Cardiovascular;  Laterality: N/A;  . CARDIAC CATHETERIZATION N/A 04/05/2016   Procedure: Coronary Stent Intervention;  Surgeon: Belva Crome, MD;  Location: [place] CV LAB;  Service: Cardiovascular;  Laterality: N/A;  . CARDIAC CATHETERIZATION N/A 04/05/2016   Procedure: Intravascular Ultrasound/IVUS;  Surgeon: Belva Crome, MD;  Location: [place] CV LAB;  Service: Cardiovascular;  Laterality: N/A;  . NASAL SINUS SURGERY     2005   Family History:  Family History  Problem Relation Age of Onset  . Prostate cancer Father   . Hypertension Mother   . Kidney Stones Mother   . Anxiety disorder Mother   . Depression Mother   . COPD Sister   . Hypertension Sister   . Diabetes Sister   . Depression Sister   . Anxiety disorder Sister   . Seizures Sister   . ADD / ADHD Son   . ADD / ADHD Daughter    Family Psychiatric  History: none Social History:  Social History   Substance and Sexual Activity  Alcohol Use No  . Alcohol/week: 0.0 oz   Comment: last drinked in 3 months.      Social History   Substance and Sexual Activity  Drug Use No    Social History   Socioeconomic History  . Marital status: Widowed    Spouse name: None  . Number of children: 3  . Years of education: 67  . Highest education level: None  Social Needs  . Financial resource strain: None  . Food insecurity - worry: None  . Food insecurity - inability: None  . Transportation needs - medical: None  . Transportation needs - non-medical: None  Occupational History    Comment: unemployed   Tobacco Use  . Smoking status: Current Every Day Smoker    Packs/day: 2.00    Years: 17.00    Pack years: 34.00    Types: Cigarettes, Cigars  . Smokeless tobacco: Former Systems developer  . Tobacco comment: occassional snuff   Substance and Sexual Activity  . Alcohol use: No    Alcohol/week: 0.0 oz    Comment: last drinked in 3 months.   . Drug use: No  . Sexual activity: Not Currently  Other Topics Concern  . None  Social History Narrative   Patient is right handed.   Patient drinks 2 sodas daily.   Additional Social History:                         Sleep: Fair  Appetite:  Fair  Current Medications: Current Facility-Administered Medications  Medication Dose Route Frequency Provider Last Rate Last Dose  . acetaminophen (TYLENOL) tablet 650 mg  650 mg Oral Q6H PRN Clapacs, Madie Reno, MD   650 mg at 03/16/17 1138  . alum & mag hydroxide-simeth (MAALOX/MYLANTA) 200-200-20 MG/5ML suspension 30 mL  30 mL Oral Q4H PRN Clapacs, Madie Reno, MD      .  aspirin EC tablet 81 mg  81 mg Oral Daily Clapacs, Madie Reno, MD   81 mg at 03/16/17 0836  . atorvastatin (LIPITOR) tablet 80 mg  80 mg Oral q1800 Clapacs, Madie Reno, MD   80 mg at 03/15/17 1645  . carvedilol (COREG) tablet 6.25 mg  6.25 mg Oral BID WC Clapacs, Madie Reno, MD   6.25 mg at 03/16/17 0837  . chlordiazePOXIDE (LIBRIUM) capsule 25 mg  25 mg Oral Q6H PRN McNew, Holly R, MD      . clopidogrel (PLAVIX) tablet 75 mg  75 mg Oral Daily Clapacs, Madie Reno, MD   75 mg at 03/16/17 0836  . divalproex (DEPAKOTE) DR tablet 1,500 mg  1,500 mg Oral QHS Clapacs, Madie Reno, MD   1,500 mg at 03/15/17 2137  . escitalopram (LEXAPRO) tablet 10 mg  10 mg Oral Daily Patrecia Pour, NP   10 mg at 03/16/17 6063  . hydrOXYzine (ATARAX/VISTARIL) tablet 50 mg  50 mg Oral TID PRN McNew, Tyson Babinski, MD      . loperamide (IMODIUM) capsule 2-4 mg  2-4 mg Oral PRN McNew, Tyson Babinski, MD      . lurasidone (LATUDA) tablet 40 mg  40 mg Oral Q breakfast Patrecia Pour, NP   40 mg at  03/16/17 0840  . magnesium hydroxide (MILK OF MAGNESIA) suspension 30 mL  30 mL Oral Daily PRN Clapacs, John T, MD      . metFORMIN (GLUCOPHAGE) tablet 500 mg  500 mg Oral BID WC Clapacs, Madie Reno, MD   500 mg at 03/16/17 0837  . mirtazapine (REMERON) tablet 15 mg  15 mg Oral QHS Patrecia Pour, NP   15 mg at 03/15/17 2138  . multivitamin with minerals tablet 1 tablet  1 tablet Oral Daily McNew, Tyson Babinski, MD   1 tablet at 03/16/17 0837  . nicotine (NICODERM CQ - dosed in mg/24 hours) patch 21 mg  21 mg Transdermal Daily McNew, Tyson Babinski, MD   21 mg at 03/16/17 0160  . ondansetron (ZOFRAN-ODT) disintegrating tablet 4 mg  4 mg Oral Q6H PRN McNew, Holly R, MD      . prazosin (MINIPRESS) capsule 2 mg  2 mg Oral QHS Clapacs, Madie Reno, MD   2 mg at 03/15/17 2136  . thiamine (VITAMIN B-1) tablet 100 mg  100 mg Oral Daily McNew, Tyson Babinski, MD   100 mg at 03/16/17 1093  . traZODone (DESYREL) tablet 100 mg  100 mg Oral QHS PRN Clapacs, Madie Reno, MD   100 mg at 03/12/17 2059  . venlafaxine XR (EFFEXOR-XR) 24 hr capsule 150 mg  150 mg Oral Q breakfast Patrecia Pour, NP   150 mg at 03/16/17 2355  . zolpidem (AMBIEN) tablet 10 mg  10 mg Oral QHS Patrecia Pour, NP   10 mg at 03/15/17 2138    Lab Results: No results found for this or any previous visit (from the past 48 hour(s)).  Blood Alcohol level:  Lab Results  Component Value Date   ETH 37 (H) 03/12/2017   ETH <10 73/22/0254    Metabolic Disorder Labs: Lab Results  Component Value Date   HGBA1C 6.0 (H) 03/13/2017   MPG 126 03/13/2017   MPG 177 09/12/2016   Lab Results  Component Value Date   PROLACTIN 18.1 (H) 09/12/2016   PROLACTIN 6.5 05/27/2016   Lab Results  Component Value Date   CHOL 118 03/13/2017   TRIG 325 (H) 03/13/2017  HDL 31 (L) 03/13/2017   CHOLHDL 3.8 03/13/2017   VLDL 65 (H) 03/13/2017   LDLCALC 22 03/13/2017   LDLCALC 37 09/12/2016    Physical Findings: AIMS: Facial and Oral Movements Muscles of Facial Expression:  None, normal Lips and Perioral Area: None, normal Jaw: None, normal Tongue: None, normal,Extremity Movements Upper (arms, wrists, hands, fingers): None, normal Lower (legs, knees, ankles, toes): None, normal, Trunk Movements Neck, shoulders, hips: None, normal, Overall Severity Severity of abnormal movements (highest score from questions above): None, normal Incapacitation due to abnormal movements: None, normal Patient's awareness of abnormal movements (rate only patient's report): No Awareness, Dental Status Current problems with teeth and/or dentures?: No Does patient usually wear dentures?: No  CIWA:  CIWA-Ar Total: 0 COWS:  COWS Total Score: 2  Musculoskeletal: Strength & Muscle Tone: within normal limits Gait & Station: normal Patient leans: N/A  Psychiatric Specialty Exam: Physical Exam  Constitutional: He is oriented to person, place, and time. He appears well-developed and well-nourished.  HENT:  Head: Normocephalic.  Neck: Normal range of motion.  Respiratory: Effort normal.  Musculoskeletal: Normal range of motion.  Neurological: He is alert and oriented to person, place, and time.  Psychiatric: His speech is normal and behavior is normal. Judgment normal. Cognition and memory are normal. He exhibits a depressed mood. He expresses suicidal ideation.    Review of Systems  Psychiatric/Behavioral: Positive for depression, substance abuse and suicidal ideas.  All other systems reviewed and are negative.   Blood pressure 124/88, pulse 79, temperature 97.9 F (36.6 C), temperature source Oral, resp. rate 18, height 6\' 2"  (1.88 m), weight 88.5 kg (195 lb), SpO2 96 %.Body mass index is 25.04 kg/m.  General Appearance: Casual  Eye Contact:  Good  Speech:  Normal Rate  Volume:  Normal  Mood:  Depressed  Affect:  Congruent  Thought Process:  Coherent and Descriptions of Associations: Intact  Orientation:  Full (Time, Place, and Person)  Thought Content:  Rumination   Suicidal Thoughts:  Yes.  without intent/plan  Homicidal Thoughts:  No  Memory:  Immediate;   Fair Recent;   Fair Remote;   Fair  Judgement:  Fair  Insight:  Fair  Psychomotor Activity:  Normal  Concentration:  Concentration: Fair and Attention Span: Fair  Recall:  [employer] of Knowledge:  Fair  Language:  Good  Akathisia:  No  Handed:  Right  AIMS (if indicated):     Assets:  Housing Leisure Time Physical Health Resilience Social Support  ADL's:  Intact  Cognition:  WNL  Sleep:  Number of Hours: 7.5    Treatment Plan Summary: 46 yo male with history of multiple admission, chronic depression and SI admitted due to SI and insomnia. Pt's main focus is insomnia and reports this has caused worsening depression. He has upcoming court hearing for DUI and needs to show the judge that he is making an effort for treatment. He is requesting residential or outpatient alcohol treatment. He reports chronic SI "everyday" for several years. He states that he will not act on it because of his children. He has had at least 10 hospitalization in the past year and has not had much benefit from that. He has poor response to medications. He does not have a daily routine and does not get out of the house which is likely contributing to insomnia (he sleeps in living room and hangs out there all day) and depression. HE also does not see a therapist which would be  of benefit for him. Will try to help him get some sleep while in the hospital since this is a primary concern for him.  Plan:  Mood disorder -Continue Latuda 40 mg for depression (goal to taper off of Latuda), Effexor XR 150 mg for depression, Depakote 1500 mg qhs continued for mood stabilization, Lexapro 10 mg daily for depression continued with the goal to titrate up as he tapers off the Effexor  Insomnia -Ambien 10 mg for sleep, goal to taper off as he tapers on the Seroquel. He is aware that he will not be prescribed this outside of the  hospital.  -Remeron 15 mg at bedtime for sleep -Prazosin 2 mg continued for nightmares -Start Seroquel 50 mg at bedtime for sleep/mood (goal to titrate up and taper off the Ambien)  Alcohol abuse -will have CSW look into treatment options  HTN -Carvedilol 6.25 mg BID  DM -Metformin 500 mg BID   Waylan Boga, NP 03/16/2017, 2:27 PMPatient ID: Michael Schmitt, male   DOB: 1971-03-06, 46 y.o.   MRN: [id number]

## 2017-03-16 NOTE — BHH Group Notes (Signed)
03/16/2017 1pm  Type of Therapy and Topic:  Group Therapy:  Feelings around Relapse and Recovery  Participation Level:  Active   Description of Group:    Patients in this group will discuss emotions they experience before and after a relapse. They will process how experiencing these feelings, or avoidance of experiencing them, relates to having a relapse. Facilitator will guide patients to explore emotions they have related to recovery. Patients will be encouraged to process which emotions are more powerful. They will be guided to discuss the emotional reaction significant others in their lives may have to patients' relapse or recovery. Patients will be assisted in exploring ways to respond to the emotions of others without this contributing to a relapse.  Therapeutic Goals: 1. Patient will identify two or more emotions that lead to a relapse for them 2. Patient will identify two emotions that result when they relapse 3. Patient will identify two emotions related to recovery 4. Patient will demonstrate ability to communicate their needs through discussion and/or role plays   Summary of Patient Progress:  Actively and appropriately engaged in the group. Patient was able to provide support and validation to other group members.Patient practiced active listening when interacting with the facilitator and other group members Patient in still in the process of obtaining treatment goals. Michael Schmitt says that "alcohol brought me in here. My goals are to go to a residential treatment program."  Therapeutic Modalities:   Cognitive Behavioral Therapy Solution-Focused Therapy Assertiveness Training Relapse Prevention Therapy   Darin Engels, LCSW 03/16/2017 2:00 PM

## 2017-03-16 NOTE — BHH Group Notes (Signed)
[place] Group Notes:  (Nursing/MHT/Case Management/Adjunct)  Date:  03/16/2017  Time:  3:13 PM  Type of Therapy:  Psychoeducational Skills  Participation Level:  Did Not Attend  Participation Quality:  Appropriate and Attentive  Affect:  Appropriate  Cognitive:  Alert and Appropriate  Insight:  Appropriate and Good  Engagement in Group:  Engaged  Modes of Intervention:  Discussion, Education and Socialization  Summary of Progress/Problems:  Michael Schmitt 03/16/2017, 3:13 PM

## 2017-03-16 NOTE — Progress Notes (Signed)
Received Michael Schmitt thias am after breakfast, he received his medication and returned to his room lying in the bed awake. During our one to one assessment he verbalized and rated his depression and anxiety a 10/10, denied feeling suicidal and hearing voices. He stated his stressors are related to being on disability, unable to work as a plummer and not being able  to care for his children. He was encouraged to get up, attend the group therapy sessions and socialized with his peers. It was suggested he consult with his social worker related to non [employer], such as [facility] that would get him out of the house for short intervals during the day. He has remained OOB this PM in the milieu.

## 2017-03-16 NOTE — BHH Group Notes (Signed)
[place] Group Notes:  (Nursing/MHT/Case Management/Adjunct)  Date:  03/16/2017  Time:  5:06 AM  Type of Therapy:  Psychoeducational Skills  Participation Level:  Active  Participation Quality:  Appropriate and Attentive  Affect:  Appropriate  Cognitive:  Appropriate  Insight:  Appropriate  Engagement in Group:  Engaged  Modes of Intervention:  Discussion, Socialization and Support  Summary of Progress/Problems:  [name] 03/16/2017, 5:06 AM

## 2017-03-16 NOTE — BHH Group Notes (Signed)
[facility] Group Notes:  (Nursing/MHT/Case Management/Adjunct)  Date:  03/16/2017  Time:  10:26 PM  Type of Therapy:  Evening Wrap-up Group  Participation Level:  Did Not Attend  Participation Quality:  N/A  Affect:  N/A  Cognitive:  N/A  Insight:  None  Engagement in Group:  Did Not Attend  Modes of Intervention:  Discussion, Socialization and Support  Summary of Progress/Problems:  Michael Schmitt 03/16/2017, 10:26 PM

## 2017-03-17 MED ORDER — ESCITALOPRAM OXALATE 10 MG PO TABS
20.0000 mg | ORAL_TABLET | Freq: Every day | ORAL | Status: DC
  Administered 2017-03-18 – 2017-04-02 (×16): 20 mg via ORAL
  Filled 2017-03-17 (×16): qty 2

## 2017-03-17 NOTE — Plan of Care (Signed)
Patient laying in bed. He denies si, hi,avh. Just feels depressed. He does get up for snack and he is med compliant. No behavior issues noted. Will continue to monitor.

## 2017-03-17 NOTE — B[facility] Group Notes (Signed)
LCSW Group Therapy Note   03/17/2017 1:15pm Type of Therapy and Topic:  Group Therapy:  Trust and Honesty  Participation Level:  None  Description of Group:    In this group patients will be asked to explore the value of being honest.  Patients will be guided to discuss their thoughts, feelings, and behaviors related to honesty and trusting in others. Patients will process together how trust and honesty relate to forming relationships with peers, family members, and self. Each patient will be challenged to identify and express feelings of being vulnerable. Patients will discuss reasons why people are dishonest and identify alternative outcomes if one was truthful (to self or others). This group will be process-oriented, with patients participating in exploration of their own experiences, giving and receiving support, and processing challenge from other group members.   Therapeutic Goals: 1. Patient will identify why honesty is important to relationships and how honesty overall affects relationships.  2. Patient will identify a situation where they lied or were lied too and the  feelings, thought process, and behaviors surrounding the situation 3. Patient will identify the meaning of being vulnerable, how that feels, and how that correlates to being honest with self and others. 4. Patient will identify situations where they could have told the truth, but instead lied and explain reasons of dishonesty.   Summary of Patient Progress : Pt attended group but did not participate.      Therapeutic Modalities:   Cognitive Behavioral Therapy Solution Focused Therapy Motivational Interviewing Brief Therapy Kenyon Eichelberger  CUEBAS-COLON, LCSW 03/17/2017 10:24 AM

## 2017-03-17 NOTE — Plan of Care (Signed)
Continues to endorse passive SI.  Verbalizes that he does not have a plan while he is in here.  When asked if he was not in the hospital would he have a plan.  States "Probably" but would not disclose any further details.  [place] halls. Interacts with select few peers.  No inappropriate behavior or outbursts noted.  Support offered.  Safety maintained on the unit.

## 2017-03-17 NOTE — Progress Notes (Addendum)
[facility] MD Progress Note  03/17/2017 2:17 PM Michael Schmitt  MRN:  [id number] Subjective:  46 yo male who presented to the ED with suicidal ideations.  Endorses no improvement at all in symptoms, along with persisting hopelessness and worthlessness, low energy, low motivation, sadness of mood.  Endorses that none of the medications are helping him.  Tells me that he has been trying to get ECT but has not been able to.  Also says that he has been thinking about putting a gun to his head once he gets out of the hospital. Tells me that Effexor has been practically useless from the time to begin the medication at [facility].  Is not sure of the Anette Guarneri is doing anything.  Continues to lie in bed and has poor hygiene. Principal Problem: Severe recurrent major depression without psychotic features ([place]) Diagnosis:   Patient Active Problem List   Diagnosis Date Noted  . Severe recurrent major depression without psychotic features ([place]) [F33.2] 03/12/2017  . Generalized anxiety disorder [F41.1] 02/05/2017  . Chronic neck pain (Primary Area of Pain) (Left) [M54.2, G89.29] 11/22/2016  . Cervical facet syndrome (Left) [X10.626] 11/22/2016  . Cervical radiculitis (Left) [M54.12] 11/22/2016  . History of cervical spinal surgery [Z98.890] 11/22/2016  . Cervical spondylosis [M47.22] 11/22/2016  . Chronic shoulder pain (Secondary Area of Pain) (Left) [M25.512, G89.29] 11/22/2016  . 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 Chronic low back pain [facility] Area of Pain) (Bilateral) (L>R) [M54.5, G89.29] 11/22/2016  . Lumbar facet syndrome (Bilateral) (L>R) [M47.816] 11/22/2016  . Chronic pain syndrome [G89.4] 11/21/2016  . Long term prescription benzodiazepine use [Z79.899] 11/21/2016  . Opiate use [F11.90] 11/21/2016  . Uncontrolled type 2 diabetes mellitus without complication, without long-term current use of insulin ([place]) [E11.65] 10/19/2016  . Chronic Suicidal ideation [R45.851] 09/15/2016  .  Coronary artery disease [I25.10] 09/12/2016  . HTN (hypertension) [I10] 06/26/2016  . GERD (gastroesophageal reflux disease) [K21.9] 06/26/2016  . Alcohol use disorder, severe, 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[place]) [F10.21] 06/26/2016  . Moderate benzodiazepine use disorder ([place]) [F13.20] 06/11/2016  . Sedative, hypnotic or anxiolytic use disorder, severe, dependence ([place]) [F13.20] 05/12/2016  . Ventricular fibrillation ([place]) [I49.01] 04/05/2016  . 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 Cervical spondylosis without myelopathy [M47.812] 07/24/2014  . Severe episode of recurrent major depressive disorder, without psychotic features ([place]) [F33.2] 04/15/2013  . Tobacco use disorder [F17.200] 10/20/2010  . Asthma [J45.909] 10/20/2010   Total Time spent with patient: 30 minutes  Past Psychiatric History: depression, alcohol abuse  Past Medical History:  Past Medical History:  Diagnosis Date  . Anxiety   . Anxiety   . Arthritis    cerv. stenosis, spondylosis, HNP- lower back , has been followed in pain clinic, has  had injection s in cerv. area  . Blood dyscrasia    told that when he was young he was a" free bleeder"  . CAD (coronary artery disease)   . Cervical spondylosis without myelopathy 07/24/2014  . Cervicogenic headache 07/24/2014  . Chronic kidney disease    renal calculi- passed spontaneously  . Depression   . Diabetes mellitus without complication ([place])   . Fatty liver   . GERD (gastroesophageal reflux disease)   . Headache(784.0)   . Hyperlipidemia   . Hypertension   . Mental disorder   . MI, old   . RLS (restless legs syndrome)    detected on sleep study  . Shortness of breath   . Ventricular fibrillation ([place]) 04/05/2016  Past Surgical History:  Procedure Laterality Date  . ANTERIOR CERVICAL DECOMP/DISCECTOMY FUSION  11/13/2011   Procedure: ANTERIOR CERVICAL DECOMPRESSION/DISCECTOMY FUSION 1 LEVEL;  Surgeon: [name], MD;  Location: [place] [facility];  Service:  Neurosurgery;  Laterality: N/A;  Anterior Cervical Decompression/discectomy Fusion. Cervical three-four.  Mar[facility] Kitchen CARDIAC CATHETERIZATION N/A 01/01/2015   Procedure: Left Heart Cath and Coronary Angiography;  Surgeon: [name], MD;  Location: [place] [facility];  Service: Cardiovascular;  Laterality: N/A;  . CARDIAC CATHETERIZATION N/A 04/05/2016   Procedure: Left Heart Cath and Coronary Angiography;  Surgeon: Belva Crome, MD;  Location: [place] [facility];  Service: Cardiovascular;  Laterality: N/A;  . CARDIAC CATHETERIZATION N/A 04/05/2016   Procedure: Coronary Stent Intervention;  Surgeon: Belva Crome, MD;  Location: [place] [facility];  Service: Cardiovascular;  Laterality: N/A;  . CARDIAC CATHETERIZATION N/A 04/05/2016   Procedure: Intravascular Ultrasound/IVUS;  Surgeon: Belva Crome, MD;  Location: [place] [facility];  Service: Cardiovascular;  Laterality: N/A;  . NASAL SINUS SURGERY     2005   Family History:  Family History  Problem Relation Age of Onset  . Prostate cancer Father   . Hypertension Mother   . Kidney Stones Mother   . Anxiety disorder Mother   . Depression Mother   . COPD Sister   . Hypertension Sister   . Diabetes Sister   . Depression Sister   . Anxiety disorder Sister   . Seizures Sister   . ADD / ADHD Son   . ADD / ADHD Daughter    Family Psychiatric  History: none Social History:  Social History   Substance and Sexual Activity  Alcohol Use No  . Alcohol/week: 0.0 oz   Comment: last drinked in 3 months.      Social History   Substance and Sexual Activity  Drug Use No    Social History   Socioeconomic History  . Marital status: Widowed    Spouse name: None  . Number of children: 3  . Years of education: 31  . Highest education level: None  Social Needs  . Financial resource strain: None  . Food insecurity - worry: None  . Food insecurity - inability: None  . Transportation needs - medical: None  . Transportation needs - non-medical:  None  Occupational History    Comment: unemployed  Tobacco Use  . Smoking status: Current Every Day Smoker    Packs/day: 2.00    Years: 17.00    Pack years: 34.00    Types: Cigarettes, Cigars  . Smokeless tobacco: Former Systems developer  . Tobacco comment: occassional snuff   Substance and Sexual Activity  . Alcohol use: No    Alcohol/week: 0.0 oz    Comment: last drinked in 3 months.   . Drug use: No  . Sexual activity: Not Currently  Other Topics Concern  . None  Social History Narrative   Patient is right handed.   Patient drinks 2 sodas daily.   Additional Social History:                         Sleep: Fair  Appetite:  Fair  Current Medications: Current Facility-Administered Medications  Medication Dose Route Frequency Provider Last Rate Last Dose  . acetaminophen (TYLENOL) tablet 650 mg  650 mg Oral Q6H PRN Clapacs, Madie Reno, MD   650 mg at 03/16/17 1138  . alum & mag hydroxide-simeth (MAALOX/MYLANTA) 200-200-20 MG/5ML suspension 30 mL  30 mL Oral Q4H PRN Clapacs, John T, MD      . aspirin EC tablet 81 mg  81 mg Oral Daily Clapacs, Madie Reno, MD   81 mg at 03/17/17 0834  . atorvastatin (LIPITOR) tablet 80 mg  80 mg Oral q1800 Clapacs, Madie Reno, MD   80 mg at 03/16/17 1818  . carvedilol (COREG) tablet 6.25 mg  6.25 mg Oral BID WC Clapacs, Madie Reno, MD   6.25 mg at 03/17/17 9381  . clopidogrel (PLAVIX) tablet 75 mg  75 mg Oral Daily Clapacs, Madie Reno, MD   75 mg at 03/17/17 0834  . divalproex (DEPAKOTE) DR tablet 1,500 mg  1,500 mg Oral QHS Clapacs, Madie Reno, MD   1,500 mg at 03/16/17 2117  . escitalopram (LEXAPRO) tablet 10 mg  10 mg Oral Daily Patrecia Pour, NP   10 mg at 03/17/17 0175  . hydrOXYzine (ATARAX/VISTARIL) tablet 50 mg  50 mg Oral TID PRN Marylin Crosby, MD      . lurasidone (LATUDA) tablet 40 mg  40 mg Oral Q breakfast Patrecia Pour, NP   40 mg at 03/17/17 1025  . magnesium hydroxide (MILK OF MAGNESIA) suspension 30 mL  30 mL Oral Daily PRN Clapacs, John T, MD       . metFORMIN (GLUCOPHAGE) tablet 500 mg  500 mg Oral BID WC Clapacs, Madie Reno, MD   500 mg at 03/17/17 [phone]  . mirtazapine (REMERON) tablet 15 mg  15 mg Oral QHS Patrecia Pour, NP   15 mg at 03/16/17 2117  . multivitamin with minerals tablet 1 tablet  1 tablet Oral Daily McNew, Tyson Babinski, MD   1 tablet at 03/17/17 [phone]  . nicotine (NICODERM CQ - dosed in mg/24 hours) patch 21 mg  21 mg Transdermal Daily McNew, Tyson Babinski, MD   21 mg at 03/17/17 0834  . prazosin (MINIPRESS) capsule 2 mg  2 mg Oral QHS Clapacs, Madie Reno, MD   2 mg at 03/16/17 2117  . thiamine (VITAMIN B-1) tablet 100 mg  100 mg Oral Daily McNew, Tyson Babinski, MD   100 mg at 03/17/17 2353  . traZODone (DESYREL) tablet 100 mg  100 mg Oral QHS PRN Clapacs, Madie Reno, MD   100 mg at 03/12/17 2059  . venlafaxine XR (EFFEXOR-XR) 24 hr capsule 150 mg  150 mg Oral Q breakfast Patrecia Pour, NP   150 mg at 03/17/17 6144  . zolpidem (AMBIEN) tablet 10 mg  10 mg Oral QHS Patrecia Pour, NP   10 mg at 03/16/17 2117    Lab Results: No results found for this or any previous visit (from the past 48 hour(s)).  Blood Alcohol level:  Lab Results  Component Value Date   ETH 37 (H) 03/12/2017   ETH <10 31/54/0086    Metabolic Disorder Labs: Lab Results  Component Value Date   HGBA1C 6.0 (H) 03/13/2017   MPG 126 03/13/2017   MPG 177 09/12/2016   Lab Results  Component Value Date   PROLACTIN 18.1 (H) 09/12/2016   PROLACTIN 6.5 05/27/2016   Lab Results  Component Value Date   CHOL 118 03/13/2017   TRIG 325 (H) 03/13/2017   HDL 31 (L) 03/13/2017   CHOLHDL 3.8 03/13/2017   VLDL 65 (H) 03/13/2017   LDLCALC 22 03/13/2017   LDLCALC 37 09/12/2016    Physical Findings: AIMS: Facial and Oral Movements Muscles of Facial Expression: None, normal Lips and Perioral Area: None, normal  Jaw: None, normal Tongue: None, normal,Extremity Movements Upper (arms, wrists, hands, fingers): None, normal Lower (legs, knees, ankles, toes): None, normal, Trunk  Movements Neck, shoulders, hips: None, normal, Overall Severity Severity of abnormal movements (highest score from questions above): None, normal Incapacitation due to abnormal movements: None, normal Patient's awareness of abnormal movements (rate only patient's report): No Awareness, Dental Status Current problems with teeth and/or dentures?: No Does patient usually wear dentures?: No  CIWA:  CIWA-Ar Total: 0 COWS:  COWS Total Score: 2  Musculoskeletal: Strength & Muscle Tone: within normal limits Gait & Station: normal Patient leans: N/A  Psychiatric Specialty Exam: Physical Exam  Constitutional: He is oriented to person, place, and time. He appears well-developed and well-nourished.  HENT:  Head: Normocephalic.  Neck: Normal range of motion.  Respiratory: Effort normal.  Musculoskeletal: Normal range of motion.  Neurological: He is alert and oriented to person, place, and time.  Psychiatric: His speech is normal and behavior is normal. Judgment normal. Cognition and memory are normal. He exhibits a depressed mood. He expresses suicidal ideation.    Review of Systems  Psychiatric/Behavioral: Positive for depression, substance abuse and suicidal ideas.  All other systems reviewed and are negative.   Blood pressure 122/89, pulse 82, temperature 98 F (36.7 C), temperature source Oral, resp. rate 18, height 6\' 2"  (1.88 m), weight 195 lb (88.5 kg), SpO2 96 %.Body mass index is 25.04 kg/m.  General Appearance: Casual  Eye Contact:  Good  Speech:  Normal Rate  Volume:  Normal  Mood:  Depressed  Affect:  Congruent  Thought Process:  Coherent and Descriptions of Associations: Intact  Orientation:  Full (Time, Place, and Person)  Thought Content:  Rumination  Suicidal Thoughts:  Yes.  without intent/plan  Homicidal Thoughts:  No  Memory:  Immediate;   Fair Recent;   Fair Remote;   Fair  Judgement:  Fair  Insight:  Fair  Psychomotor Activity:  Normal  Concentration:   Concentration: Fair and Attention Span: Fair  Recall:  [employer] of Knowledge:  Fair  Language:  Good  Akathisia:  No  Handed:  Right  AIMS (if indicated):     Assets:  Housing Leisure Time Physical Health Resilience Social Support  ADL's:  Intact  Cognition:  WNL  Sleep:  Number of Hours: 7.5    Treatment Plan Summary: 46 yo male with history of multiple admission, chronic depression and SI admitted due to SI and insomnia. Pt's main focus is insomnia and reports this has caused worsening depression. He has upcoming court hearing for DUI and needs to show the judge that he is making an effort for treatment. He is requesting residential or outpatient alcohol treatment. He reports chronic SI "everyday" for several years. He states that he will not act on it because of his children. He has had at least 10 hospitalization in the past year and has not had much benefit from that. He has poor response to medications. He does not have a daily routine and does not get out of the house which is likely contributing to insomnia (he sleeps in living room and hangs out there all day) and depression. He also does not see a therapist although this would be of benefit for him.  Patient may possibly benefit from a trial of ECT given severity of depressive symptoms.  Plan:  Mood disorder -Continue Latuda 40 mg for depression (goal to taper off of Latuda), Effexor XR 150 mg for depression, Depakote 1500 mg qhs  continued for mood stabilization (level not obtained during this hospitalization), increase Lexapro to 20 mg daily for depression continued with the goal to titrate up as he tapers off the Effexor  Insomnia -Remeron 15 mg at bedtime for sleep -Prazosin 2 mg continued for nightmares Stop ambien  Alcohol abuse -will have CSW look into treatment options  HTN -Carvedilol 6.25 mg BID  DM -Metformin 500 mg BID   Ramond Dial, MD 03/17/2017, 2:17 PM

## 2017-03-17 NOTE — [facility] Group Notes (Signed)
[facility] Group Notes:  (Nursing/MHT/Case Management/Adjunct)  Date:  03/17/2017  Time:  9:38 PM  Type of Therapy:  Group Therapy  Participation Level:  Active  Participation Quality:  Appropriate  Affect:  Appropriate  Cognitive:  Alert  Insight:  Good  Engagement in Group:  Engaged  Modes of Intervention:  Support  Summary of Progress/Problems:  Michael Schmitt 03/17/2017, 9:38 PM

## 2017-03-18 MED ORDER — VENLAFAXINE HCL ER 75 MG PO CP24
75.0000 mg | ORAL_CAPSULE | Freq: Every day | ORAL | Status: DC
  Administered 2017-03-19 – 2017-03-26 (×8): 75 mg via ORAL
  Filled 2017-03-18 (×8): qty 1

## 2017-03-18 MED ORDER — TRAZODONE HCL 100 MG PO TABS
100.0000 mg | ORAL_TABLET | Freq: Every evening | ORAL | Status: DC | PRN
  Administered 2017-03-21 – 2017-03-31 (×5): 100 mg via ORAL
  Filled 2017-03-18 (×6): qty 1

## 2017-03-18 MED ORDER — ZOLPIDEM TARTRATE 5 MG PO TABS
10.0000 mg | ORAL_TABLET | Freq: Every day | ORAL | Status: DC
  Administered 2017-03-18 – 2017-04-01 (×15): 10 mg via ORAL
  Filled 2017-03-18 (×16): qty 2

## 2017-03-18 NOTE — Plan of Care (Signed)
Pt continues to be intrusive at time. Pt continues to pace hallways. Pt denies ah/vh/si/hi at this time. Pt is medication compliant. Remains on Q 15 mins safety rounds. Will cont to monitor pt.

## 2017-03-18 NOTE — Plan of Care (Signed)
Patient laying in bed stating he just feels depressed. He states the medication is not working and they have mentioned ECT. He states he has done this in the past but only completed 1 treatment. He states he will be safe in here but not sure if he was at home. He just cant explain why he is depressed and makes him feel less of a man. He does come out for snack and medications but isolates to self. Safety maintained. Will continue to monitor.

## 2017-03-18 NOTE — Progress Notes (Signed)
[facility] MD Progress Note  03/18/2017 12:43 PM Michael Schmitt  MRN:  [id number] Subjective:  46 yo male who presented to the ED with suicidal ideations.  Endorses no improvement at all in symptoms, along with persisting hopelessness and worthlessness, low energy, low motivation, sadness of mood.  Endorses that none of the medications are helping him.  Has a PHQ 9 score of 27. Patient wakes up from his bed and gets out of his room only to get medications, has not been observed to interact at all with other patients otherwise.  Yesterday I canceled his Ambien, endorses that he could not sleep at night.  Endorses that the trazodone or the mirtazapine do not really help him.  Would like to have his Ambien back. Principal Problem: Severe recurrent major depression without psychotic features ([place]) Diagnosis:   Patient Active Problem List   Diagnosis Date Noted  . Severe recurrent major depression without psychotic features ([place]) [F33.2] 03/12/2017  . Generalized anxiety disorder [F41.1] 02/05/2017  . Chronic neck pain (Primary Area of Pain) (Left) [M54.2, G89.29] 11/22/2016  . Cervical facet syndrome (Left) [W41.324] 11/22/2016  . Cervical radiculitis (Left) [M54.12] 11/22/2016  . History of cervical spinal surgery [Z98.890] 11/22/2016  . Cervical spondylosis [M47.22] 11/22/2016  . Chronic shoulder pain (Secondary Area of Pain) (Left) [M25.512, G89.29] 11/22/2016  . 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 Chronic low back pain [facility] Area of Pain) (Bilateral) (L>R) [M54.5, G89.29] 11/22/2016  . Lumbar facet syndrome (Bilateral) (L>R) [M47.816] 11/22/2016  . Chronic pain syndrome [G89.4] 11/21/2016  . Long term prescription benzodiazepine use [Z79.899] 11/21/2016  . Opiate use [F11.90] 11/21/2016  . Uncontrolled type 2 diabetes mellitus without complication, without long-term current use of insulin ([place]) [E11.65] 10/19/2016  . Chronic Suicidal ideation [R45.851] 09/15/2016  .  Coronary artery disease [I25.10] 09/12/2016  . HTN (hypertension) [I10] 06/26/2016  . GERD (gastroesophageal reflux disease) [K21.9] 06/26/2016  . Alcohol use disorder, severe, 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[place]) [F10.21] 06/26/2016  . Moderate benzodiazepine use disorder ([place]) [F13.20] 06/11/2016  . Sedative, hypnotic or anxiolytic use disorder, severe, dependence ([place]) [F13.20] 05/12/2016  . Ventricular fibrillation ([place]) [I49.01] 04/05/2016  . 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 Cervical spondylosis without myelopathy [M47.812] 07/24/2014  . Severe episode of recurrent major depressive disorder, without psychotic features ([place]) [F33.2] 04/15/2013  . Tobacco use disorder [F17.200] 10/20/2010  . Asthma [J45.909] 10/20/2010   Total Time spent with patient: 30 minutes  Past Psychiatric History: depression, alcohol abuse  Past Medical History:  Past Medical History:  Diagnosis Date  . Anxiety   . Anxiety   . Arthritis    cerv. stenosis, spondylosis, HNP- lower back , has been followed in pain clinic, has  had injection s in cerv. area  . Blood dyscrasia    told that when he was young he was a" free bleeder"  . CAD (coronary artery disease)   . Cervical spondylosis without myelopathy 07/24/2014  . Cervicogenic headache 07/24/2014  . Chronic kidney disease    renal calculi- passed spontaneously  . Depression   . Diabetes mellitus without complication ([place])   . Fatty liver   . GERD (gastroesophageal reflux disease)   . Headache(784.0)   . Hyperlipidemia   . Hypertension   . Mental disorder   . MI, old   . RLS (restless legs syndrome)    detected on sleep study  . Shortness of breath   . Ventricular fibrillation ([place]) 04/05/2016    Past Surgical History:  Procedure  Laterality Date  . ANTERIOR CERVICAL DECOMP/DISCECTOMY FUSION  11/13/2011   Procedure: ANTERIOR CERVICAL DECOMPRESSION/DISCECTOMY FUSION 1 LEVEL;  Surgeon: [name], MD;  Location: [place] [facility];  Service:  Neurosurgery;  Laterality: N/A;  Anterior Cervical Decompression/discectomy Fusion. Cervical three-four.  Marland Kitchen CARDIAC CATHETERIZATION N/A 01/01/2015   Procedure: Left Heart Cath and Coronary Angiography;  Surgeon: [name], MD;  Location: [place] CV LAB;  Service: Cardiovascular;  Laterality: N/A;  . CARDIAC CATHETERIZATION N/A 04/05/2016   Procedure: Left Heart Cath and Coronary Angiography;  Surgeon: Belva Crome, MD;  Location: [place] CV LAB;  Service: Cardiovascular;  Laterality: N/A;  . CARDIAC CATHETERIZATION N/A 04/05/2016   Procedure: Coronary Stent Intervention;  Surgeon: Belva Crome, MD;  Location: [place] CV LAB;  Service: Cardiovascular;  Laterality: N/A;  . CARDIAC CATHETERIZATION N/A 04/05/2016   Procedure: Intravascular Ultrasound/IVUS;  Surgeon: Belva Crome, MD;  Location: [place] [facility];  Service: Cardiovascular;  Laterality: N/A;  . NASAL SINUS SURGERY     2005   Family History:  Family History  Problem Relation Age of Onset  . Prostate cancer Father   . Hypertension Mother   . Kidney Stones Mother   . Anxiety disorder Mother   . Depression Mother   . COPD Sister   . Hypertension Sister   . Diabetes Sister   . Depression Sister   . Anxiety disorder Sister   . Seizures Sister   . ADD / ADHD Son   . ADD / ADHD Daughter    Family Psychiatric  History: none Social History:  Social History   Substance and Sexual Activity  Alcohol Use No  . Alcohol/week: 0.0 oz   Comment: last drinked in 3 months.      Social History   Substance and Sexual Activity  Drug Use No    Social History   Socioeconomic History  . Marital status: Widowed    Spouse name: None  . Number of children: 3  . Years of education: 22  . Highest education level: None  Social Needs  . Financial resource strain: None  . Food insecurity - worry: None  . Food insecurity - inability: None  . Transportation needs - medical: None  . Transportation needs - non-medical:  None  Occupational History    Comment: unemployed  Tobacco Use  . Smoking status: Current Every Day Smoker    Packs/day: 2.00    Years: 17.00    Pack years: 34.00    Types: Cigarettes, Cigars  . Smokeless tobacco: Former Systems developer  . Tobacco comment: occassional snuff   Substance and Sexual Activity  . Alcohol use: No    Alcohol/week: 0.0 oz    Comment: last drinked in 3 months.   . Drug use: No  . Sexual activity: Not Currently  Other Topics Concern  . None  Social History Narrative   Patient is right handed.   Patient drinks 2 sodas daily.   Additional Social History:                         Sleep: Fair  Appetite:  Fair  Current Medications: Current Facility-Administered Medications  Medication Dose Route Frequency Provider Last Rate Last Dose  . acetaminophen (TYLENOL) tablet 650 mg  650 mg Oral Q6H PRN Clapacs, Madie Reno, MD   650 mg at 03/16/17 1138  . alum & mag hydroxide-simeth (MAALOX/MYLANTA) 200-200-20 MG/5ML suspension 30 mL  30 mL Oral Q4H PRN  Clapacs, Madie Reno, MD      . aspirin EC tablet 81 mg  81 mg Oral Daily Clapacs, Madie Reno, MD   81 mg at 03/18/17 0853  . atorvastatin (LIPITOR) tablet 80 mg  80 mg Oral q1800 Clapacs, Madie Reno, MD   80 mg at 03/17/17 1702  . carvedilol (COREG) tablet 6.25 mg  6.25 mg Oral BID WC Clapacs, Madie Reno, MD   6.25 mg at 03/18/17 0853  . clopidogrel (PLAVIX) tablet 75 mg  75 mg Oral Daily Clapacs, Madie Reno, MD   75 mg at 03/18/17 0853  . divalproex (DEPAKOTE) DR tablet 1,500 mg  1,500 mg Oral QHS Clapacs, John T, MD   1,500 mg at 03/17/17 2031  . escitalopram (LEXAPRO) tablet 20 mg  20 mg Oral Daily Ramond Dial, MD   20 mg at 03/18/17 0853  . hydrOXYzine (ATARAX/VISTARIL) tablet 50 mg  50 mg Oral TID PRN Marylin Crosby, MD   50 mg at 03/17/17 2033  . lurasidone (LATUDA) tablet 40 mg  40 mg Oral Q breakfast Patrecia Pour, NP   40 mg at 03/18/17 0854  . magnesium hydroxide (MILK OF MAGNESIA) suspension 30 mL  30 mL Oral Daily PRN  Clapacs, John T, MD      . metFORMIN (GLUCOPHAGE) tablet 500 mg  500 mg Oral BID WC Clapacs, Madie Reno, MD   500 mg at 03/18/17 0854  . multivitamin with minerals tablet 1 tablet  1 tablet Oral Daily McNew, Tyson Babinski, MD   1 tablet at 03/18/17 0854  . nicotine (NICODERM CQ - dosed in mg/24 hours) patch 21 mg  21 mg Transdermal Daily McNew, Tyson Babinski, MD   21 mg at 03/18/17 0855  . prazosin (MINIPRESS) capsule 2 mg  2 mg Oral QHS Clapacs, Madie Reno, MD   2 mg at 03/17/17 2031  . thiamine (VITAMIN B-1) tablet 100 mg  100 mg Oral Daily McNew, Tyson Babinski, MD   100 mg at 03/18/17 0853  . [START ON 03/19/2017] venlafaxine XR (EFFEXOR-XR) 24 hr capsule 75 mg  75 mg Oral Q breakfast Ramond Dial, MD      . zolpidem (AMBIEN) tablet 10 mg  10 mg Oral QHS Ramond Dial, MD        Lab Results: No results found for this or any previous visit (from the past 33 hour(s)).  Blood Alcohol level:  Lab Results  Component Value Date   ETH 37 (H) 03/12/2017   ETH <10 95/28/4132    Metabolic Disorder Labs: Lab Results  Component Value Date   HGBA1C 6.0 (H) 03/13/2017   MPG 126 03/13/2017   MPG 177 09/12/2016   Lab Results  Component Value Date   PROLACTIN 18.1 (H) 09/12/2016   PROLACTIN 6.5 05/27/2016   Lab Results  Component Value Date   CHOL 118 03/13/2017   TRIG 325 (H) 03/13/2017   HDL 31 (L) 03/13/2017   CHOLHDL 3.8 03/13/2017   VLDL 65 (H) 03/13/2017   LDLCALC 22 03/13/2017   LDLCALC 37 09/12/2016    Physical Findings: AIMS: Facial and Oral Movements Muscles of Facial Expression: None, normal Lips and Perioral Area: None, normal Jaw: None, normal Tongue: None, normal,Extremity Movements Upper (arms, wrists, hands, fingers): None, normal Lower (legs, knees, ankles, toes): None, normal, Trunk Movements Neck, shoulders, hips: None, normal, Overall Severity Severity of abnormal movements (highest score from questions above): None, normal Incapacitation due to abnormal movements: None,  normal Patient's awareness of abnormal movements (rate  only patient's report): No Awareness, Dental Status Current problems with teeth and/or dentures?: No Does patient usually wear dentures?: No  CIWA:  CIWA-Ar Total: 0 COWS:  COWS Total Score: 2  Musculoskeletal: Strength & Muscle Tone: within normal limits Gait & Station: normal Patient leans: N/A  Psychiatric Specialty Exam: Physical Exam  Constitutional: He is oriented to person, place, and time. He appears well-developed and well-nourished.  HENT:  Head: Normocephalic.  Neck: Normal range of motion.  Respiratory: Effort normal.  Musculoskeletal: Normal range of motion.  Neurological: He is alert and oriented to person, place, and time.  Psychiatric: His speech is normal and behavior is normal. Judgment normal. Cognition and memory are normal. He exhibits a depressed mood. He expresses suicidal ideation.    Review of Systems  Psychiatric/Behavioral: Positive for depression, substance abuse and suicidal ideas.  All other systems reviewed and are negative.   Blood pressure 129/77, pulse 88, temperature 97.7 F (36.5 C), temperature source Oral, resp. rate 18, height 6\' 2"  (1.88 m), weight 195 lb (88.5 kg), SpO2 97 %.Body mass index is 25.04 kg/m.  General Appearance: Casual  Eye Contact:  Good  Speech:  Normal Rate  Volume:  Normal  Mood:  Depressed  Affect:  Congruent  Thought Process:  Coherent and Descriptions of Associations: Intact  Orientation:  Full (Time, Place, and Person)  Thought Content:  Rumination  Suicidal Thoughts:  Yes.  without intent/plan  Homicidal Thoughts:  No  Memory:  Immediate;   Fair Recent;   Fair Remote;   Fair  Judgement:  Fair  Insight:  Fair  Psychomotor Activity:  Normal  Concentration:  Concentration: Fair and Attention Span: Fair  Recall:  [employer] of Knowledge:  Fair  Language:  Good  Akathisia:  No  Handed:  Right  AIMS (if indicated):     Assets:  Housing Leisure  Time Physical Health Resilience Social Support  ADL's:  Intact  Cognition:  WNL  Sleep:  Number of Hours: 7.5    Treatment Plan Summary: 46 yo male with history of multiple admission, chronic depression and SI admitted due to SI and insomnia.  He continues to endorse multiple depressive symptoms, and suicidal ideation.  Has had multiple inpatient hospitalizations for the symptoms.  It is highly possible the patient has treatment refractory depressive symptoms. Patient may possibly benefit from a trial of ECT given severity of depressive symptoms.  I will continue the Depakote, compared to agents like lamictal or second generation antipsychotics Depakote has minimal evidence for prophylaxis of MDD.  However Depakote was started by the previous provider and so I will continue the same.  Essential goal will be to stabilize him on Lexapro and Latuda. Lexapro was started by the previous provider and so I will continue to titrate the same upwards.   Although insomnia continues to be an issue, he benefits from Ambien.  Will D/C mirtazapine for now and leave trazodone as as needed.  Plan:  Mood disorder -Continue Latuda 40 mg for depression (goal to taper off of Latuda), plan to increase to 60 mg in the future. -Reduce Effexor XR to 75 mg mg for depression with the eventual goal of stopping Effexor.   -Depakote 1500 mg qhs continued for mood stabilization (level not obtained during this hospitalization). -Increase Lexapro to 20 mg daily for depression continued with the goal to titrate up as he tapers off the Effexor  Insomnia -Prazosin 2 mg continued for nightmares -Restart Ambien 10 mg at bedtime -Stop  mirtazapine to prevent polypharmacy  Alcohol abuse -will have CSW look into treatment options  HTN -Carvedilol 6.25 mg BID  DM -Metformin 500 mg BID  Continue aspirin 81 mg and clopidogrel 75 mg.   Ramond Dial, MD 03/18/2017, 12:43 PM

## 2017-03-18 NOTE — [facility] Group Notes (Signed)
LCSW Group Therapy Note 03/18/2017 1:15pm Type of Therapy and Topic: Group Therapy: Feelings Around Returning Home & Establishing a Supportive Framework and Supporting Oneself When Supports Not Available Participation Level: Did Not Attend Description of Group:  Patients first processed thoughts and feelings about upcoming discharge. These included fears of upcoming changes, lack of change, new living environments, judgements and expectations from others and overall stigma of mental health issues. The group then discussed the definition of a supportive framework, what that looks and feels like, and how do to discern it from an unhealthy non-supportive network. The group identified different types of supports as well as what to do when your family/friends are less than helpful or unavailable  Therapeutic Goals  1. Patient will identify one healthy supportive network that they can use at discharge. 2. Patient will identify one factor of a supportive framework and how to tell it from an unhealthy network. 3. Patient able to identify one coping skill to use when they do not have positive supports from others. 4. Patient will demonstrate ability to communicate their needs through discussion and/or role plays.  Summary of Patient Progress:      Therapeutic Modalities Cognitive Behavioral Therapy Motivational Interviewing   Michael Agnes  CUEBAS-COLON, LCSW 03/18/2017 12:16 PM

## 2017-03-19 NOTE — Progress Notes (Signed)
Recreation Therapy Notes   Date: 12.24 .2018  Time: 3:00pm  Location: Craft room  Behavioral response: Appropriate  Group Type: Craft  Participation level: Active  Communication: Patient was social with peers and staff.  Comments: N/A  Gunda Maqueda LRT/CTRS        Michael Schmitt 03/19/2017 3:58 PM

## 2017-03-19 NOTE — Plan of Care (Signed)
Affect flat.  Poor eye contact. Continues to endorse passive SI.  Isolates to self.  More verbal today.  Asking questions about medications and taking about possibly having ECT.  States that the doctor said he was going to talk  [name].  Further states that he is tired of being depressed.  Support ane encouragement offered.  Safety rounds maintained.  Support and encouragement offered.  Saftey rounds maintained.

## 2017-03-19 NOTE — Progress Notes (Signed)
[facility] MD Progress Note  03/19/2017 2:17 PM Michael Schmitt  MRN:  [id number] Subjective:  46 yo male who presented to the ED with suicidal ideations.  Continues to endorse persisting suicidal ideation without a plan.  Endorses that no medications are helping at this point.  Endorses that he was able to sleep okay last night.  Is in contact with family.  Endorses that his children are 25 and 49 years of age. Asks me if ECT will work for him, I communicate to him that this would be a decision that would be made by [name] who is the ECT provider at [facility]. Principal Problem: Severe recurrent major depression without psychotic features ([place]) Diagnosis:   Patient Active Problem List   Diagnosis Date Noted  . Severe recurrent major depression without psychotic features ([place]) [F33.2] 03/12/2017  . Generalized anxiety disorder [F41.1] 02/05/2017  . Chronic neck pain (Primary Area of Pain) (Left) [M54.2, G89.29] 11/22/2016  . Cervical facet syndrome (Left) [P80.998] 11/22/2016  . Cervical radiculitis (Left) [M54.12] 11/22/2016  . History of cervical spinal surgery [Z98.890] 11/22/2016  . Cervical spondylosis [M47.22] 11/22/2016  . Chronic shoulder pain (Secondary Area of Pain) (Left) [M25.512, G89.29] 11/22/2016  . 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 Chronic low back pain [facility] Area of Pain) (Bilateral) (L>R) [M54.5, G89.29] 11/22/2016  . Lumbar facet syndrome (Bilateral) (L>R) [M47.816] 11/22/2016  . Chronic pain syndrome [G89.4] 11/21/2016  . Long term prescription benzodiazepine use [Z79.899] 11/21/2016  . Opiate use [F11.90] 11/21/2016  . Uncontrolled type 2 diabetes mellitus without complication, without long-term current use of insulin ([place]) [E11.65] 10/19/2016  . Chronic Suicidal ideation [R45.851] 09/15/2016  . Coronary artery disease [I25.10] 09/12/2016  . HTN (hypertension) [I10] 06/26/2016  . GERD (gastroesophageal reflux disease) [K21.9] 06/26/2016  .  Alcohol use disorder, severe, 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[place]) [F10.21] 06/26/2016  . Moderate benzodiazepine use disorder ([place]) [F13.20] 06/11/2016  . Sedative, hypnotic or anxiolytic use disorder, severe, dependence ([place]) [F13.20] 05/12/2016  . Ventricular fibrillation ([place]) [I49.01] 04/05/2016  . 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 Cervical spondylosis without myelopathy [M47.812] 07/24/2014  . Severe episode of recurrent major depressive disorder, without psychotic features ([place]) [F33.2] 04/15/2013  . Tobacco use disorder [F17.200] 10/20/2010  . Asthma [J45.909] 10/20/2010   Total Time spent with patient: 30 minutes  Past Psychiatric History: depression, alcohol abuse  Past Medical History:  Past Medical History:  Diagnosis Date  . Anxiety   . Anxiety   . Arthritis    cerv. stenosis, spondylosis, HNP- lower back , has been followed in pain clinic, has  had injection s in cerv. area  . Blood dyscrasia    told that when he was young he was a" free bleeder"  . CAD (coronary artery disease)   . Cervical spondylosis without myelopathy 07/24/2014  . Cervicogenic headache 07/24/2014  . Chronic kidney disease    renal calculi- passed spontaneously  . Depression   . Diabetes mellitus without complication ([place])   . Fatty liver   . GERD (gastroesophageal reflux disease)   . Headache(784.0)   . Hyperlipidemia   . Hypertension   . Mental disorder   . MI, old   . RLS (restless legs syndrome)    detected on sleep study  . Shortness of breath   . Ventricular fibrillation ([place]) 04/05/2016    Past Surgical History:  Procedure Laterality Date  . ANTERIOR CERVICAL DECOMP/DISCECTOMY FUSION  11/13/2011   Procedure: ANTERIOR CERVICAL DECOMPRESSION/DISCECTOMY FUSION 1 LEVEL;  Surgeon:  [name], MD;  Location: [place] NEURO ORS;  Service: Neurosurgery;  Laterality: N/A;  Anterior Cervical Decompression/discectomy Fusion. Cervical three-four.  Marland Kitchen CARDIAC CATHETERIZATION N/A 01/01/2015   Procedure:  Left Heart Cath and Coronary Angiography;  Surgeon: [name], MD;  Location: [place] CV LAB;  Service: Cardiovascular;  Laterality: N/A;  . CARDIAC CATHETERIZATION N/A 04/05/2016   Procedure: Left Heart Cath and Coronary Angiography;  Surgeon: Belva Crome, MD;  Location: [place] CV LAB;  Service: Cardiovascular;  Laterality: N/A;  . CARDIAC CATHETERIZATION N/A 04/05/2016   Procedure: Coronary Stent Intervention;  Surgeon: Belva Crome, MD;  Location: [place] CV LAB;  Service: Cardiovascular;  Laterality: N/A;  . CARDIAC CATHETERIZATION N/A 04/05/2016   Procedure: Intravascular Ultrasound/IVUS;  Surgeon: Belva Crome, MD;  Location: [place] CV LAB;  Service: Cardiovascular;  Laterality: N/A;  . NASAL SINUS SURGERY     2005   Family History:  Family History  Problem Relation Age of Onset  . Prostate cancer Father   . Hypertension Mother   . Kidney Stones Mother   . Anxiety disorder Mother   . Depression Mother   . COPD Sister   . Hypertension Sister   . Diabetes Sister   . Depression Sister   . Anxiety disorder Sister   . Seizures Sister   . ADD / ADHD Son   . ADD / ADHD Daughter    Family Psychiatric  History: none Social History:  Social History   Substance and Sexual Activity  Alcohol Use No  . Alcohol/week: 0.0 oz   Comment: last drinked in 3 months.      Social History   Substance and Sexual Activity  Drug Use No    Social History   Socioeconomic History  . Marital status: Widowed    Spouse name: None  . Number of children: 3  . Years of education: 44  . Highest education level: None  Social Needs  . Financial resource strain: None  . Food insecurity - worry: None  . Food insecurity - inability: None  . Transportation needs - medical: None  . Transportation needs - non-medical: None  Occupational History    Comment: unemployed  Tobacco Use  . Smoking status: Current Every Day Smoker    Packs/day: 2.00    Years: 17.00    Pack years:  34.00    Types: Cigarettes, Cigars  . Smokeless tobacco: Former Systems developer  . Tobacco comment: occassional snuff   Substance and Sexual Activity  . Alcohol use: No    Alcohol/week: 0.0 oz    Comment: last drinked in 3 months.   . Drug use: No  . Sexual activity: Not Currently  Other Topics Concern  . None  Social History Narrative   Patient is right handed.   Patient drinks 2 sodas daily.   Additional Social History:                         Sleep: Fair  Appetite:  Fair  Current Medications: Current Facility-Administered Medications  Medication Dose Route Frequency Provider Last Rate Last Dose  . acetaminophen (TYLENOL) tablet 650 mg  650 mg Oral Q6H PRN Clapacs, Madie Reno, MD   650 mg at 03/16/17 1138  . alum & mag hydroxide-simeth (MAALOX/MYLANTA) 200-200-20 MG/5ML suspension 30 mL  30 mL Oral Q4H PRN Clapacs, John T, MD      . aspirin EC tablet 81 mg  81 mg Oral Daily Clapacs,  Madie Reno, MD   81 mg at 03/19/17 0912  . atorvastatin (LIPITOR) tablet 80 mg  80 mg Oral q1800 Clapacs, Madie Reno, MD   80 mg at 03/17/17 1702  . carvedilol (COREG) tablet 6.25 mg  6.25 mg Oral BID WC Clapacs, Madie Reno, MD   6.25 mg at 03/19/17 0912  . clopidogrel (PLAVIX) tablet 75 mg  75 mg Oral Daily Clapacs, Madie Reno, MD   75 mg at 03/19/17 0912  . divalproex (DEPAKOTE) DR tablet 1,500 mg  1,500 mg Oral QHS Clapacs, John T, MD   1,500 mg at 03/18/17 2048  . escitalopram (LEXAPRO) tablet 20 mg  20 mg Oral Daily Ramond Dial, MD   20 mg at 03/19/17 0911  . hydrOXYzine (ATARAX/VISTARIL) tablet 50 mg  50 mg Oral TID PRN Marylin Crosby, MD   50 mg at 03/17/17 2033  . lurasidone (LATUDA) tablet 40 mg  40 mg Oral Q breakfast Patrecia Pour, NP   40 mg at 03/19/17 0912  . magnesium hydroxide (MILK OF MAGNESIA) suspension 30 mL  30 mL Oral Daily PRN Clapacs, John T, MD      . metFORMIN (GLUCOPHAGE) tablet 500 mg  500 mg Oral BID WC Clapacs, John T, MD   500 mg at 03/19/17 0092  . multivitamin with minerals  tablet 1 tablet  1 tablet Oral Daily McNew, Tyson Babinski, MD   1 tablet at 03/19/17 0911  . nicotine (NICODERM CQ - dosed in mg/24 hours) patch 21 mg  21 mg Transdermal Daily McNew, Tyson Babinski, MD   21 mg at 03/19/17 0912  . prazosin (MINIPRESS) capsule 2 mg  2 mg Oral QHS Clapacs, Madie Reno, MD   2 mg at 03/18/17 2048  . thiamine (VITAMIN B-1) tablet 100 mg  100 mg Oral Daily McNew, Tyson Babinski, MD   100 mg at 03/19/17 0911  . traZODone (DESYREL) tablet 100 mg  100 mg Oral QHS PRN Ramond Dial, MD      . venlafaxine XR (EFFEXOR-XR) 24 hr capsule 75 mg  75 mg Oral Q breakfast Ramond Dial, MD   75 mg at 03/19/17 0911  . zolpidem (AMBIEN) tablet 10 mg  10 mg Oral QHS Ramond Dial, MD   10 mg at 03/18/17 2047    Lab Results: No results found for this or any previous visit (from the past 71 hour(s)).  Blood Alcohol level:  Lab Results  Component Value Date   ETH 37 (H) 03/12/2017   ETH <10 33/00/7622    Metabolic Disorder Labs: Lab Results  Component Value Date   HGBA1C 6.0 (H) 03/13/2017   MPG 126 03/13/2017   MPG 177 09/12/2016   Lab Results  Component Value Date   PROLACTIN 18.1 (H) 09/12/2016   PROLACTIN 6.5 05/27/2016   Lab Results  Component Value Date   CHOL 118 03/13/2017   TRIG 325 (H) 03/13/2017   HDL 31 (L) 03/13/2017   CHOLHDL 3.8 03/13/2017   VLDL 65 (H) 03/13/2017   LDLCALC 22 03/13/2017   LDLCALC 37 09/12/2016    Physical Findings: AIMS: Facial and Oral Movements Muscles of Facial Expression: None, normal Lips and Perioral Area: None, normal Jaw: None, normal Tongue: None, normal,Extremity Movements Upper (arms, wrists, hands, fingers): None, normal Lower (legs, knees, ankles, toes): None, normal, Trunk Movements Neck, shoulders, hips: None, normal, Overall Severity Severity of abnormal movements (highest score from questions above): None, normal Incapacitation due to abnormal movements: None, normal Patient's awareness of abnormal movements (  rate  only patient's report): No Awareness, Dental Status Current problems with teeth and/or dentures?: No Does patient usually wear dentures?: No  CIWA:  CIWA-Ar Total: 0 COWS:  COWS Total Score: 2  Musculoskeletal: Strength & Muscle Tone: within normal limits Gait & Station: normal Patient leans: N/A  Psychiatric Specialty Exam: Physical Exam  Constitutional: He is oriented to person, place, and time. He appears well-developed and well-nourished.  HENT:  Head: Normocephalic.  Neck: Normal range of motion.  Respiratory: Effort normal.  Musculoskeletal: Normal range of motion.  Neurological: He is alert and oriented to person, place, and time.  Psychiatric: His speech is normal and behavior is normal. Judgment normal. Cognition and memory are normal. He exhibits a depressed mood. He expresses suicidal ideation.    Review of Systems  Psychiatric/Behavioral: Positive for depression, substance abuse and suicidal ideas.  All other systems reviewed and are negative.   Blood pressure 131/62, pulse 75, temperature 97.6 F (36.4 C), temperature source Oral, resp. rate 18, height 6\' 2"  (1.88 m), weight 195 lb (88.5 kg), SpO2 97 %.Body mass index is 25.04 kg/m.  General Appearance: Casual  Eye Contact:  Good  Speech:  Normal Rate  Volume:  Normal  Mood:  Depressed  Affect:  Congruent  Thought Process:  Coherent and Descriptions of Associations: Intact  Orientation:  Full (Time, Place, and Person)  Thought Content:  Rumination  Suicidal Thoughts:  Yes.  without intent/plan  Homicidal Thoughts:  No  Memory:  Immediate;   Fair Recent;   Fair Remote;   Fair  Judgement:  Fair  Insight:  Fair  Psychomotor Activity:  Normal  Concentration:  Concentration: Fair and Attention Span: Fair  Recall:  [employer] of Knowledge:  Fair  Language:  Good  Akathisia:  No  Handed:  Right  AIMS (if indicated):     Assets:  Housing Leisure Time Physical Health Resilience Social Support  ADL's:   Intact  Cognition:  WNL  Sleep:  Number of Hours: 7.5    Treatment Plan Summary: 46 yo male with history of multiple admission, chronic depression and SI admitted due to SI and insomnia.  He continues to endorse multiple depressive symptoms, and suicidal ideation.  Has had multiple inpatient hospitalizations for the symptoms.  It is highly possible the patient has treatment refractory depressive symptoms. Patient may possibly benefit from a trial of ECT given severity of depressive symptoms.  I will continue the Depakote, compared to agents like lamictal or second generation antipsychotics Depakote has minimal evidence for prophylaxis of MDD.  However Depakote was started by the previous provider and so I will continue the same.  Essential goal will be to stabilize him on Lexapro and Latuda. Lexapro was started by the previous provider and so I will continue to titrate the same upwards.   Although insomnia continues to be an issue, he benefits from Ambien.  Will D/C mirtazapine for now and leave trazodone as as needed.  Plan:  Mood disorder -Continue Latuda 40 mg for depression (goal to taper off of Latuda), plan to increase to 60 mg in the future. -Reduce Effexor XR to 75 mg mg for depression with the eventual goal of stopping Effexor.   -Depakote 1500 mg qhs continued for mood stabilization (level not obtained during this hospitalization). -Increase Lexapro to 20 mg daily for depression continued with the goal to titrate up as he tapers off the Effexor  Insomnia -Prazosin 2 mg continued for nightmares -Restart Ambien 10 mg at  bedtime -Stop mirtazapine to prevent polypharmacy  Alcohol abuse -will have CSW look into treatment options  HTN -Carvedilol 6.25 mg BID  DM -Metformin 500 mg BID  Continue aspirin 81 mg and clopidogrel 75 mg.   Ramond Dial, MD 03/19/2017, 2:17 PM

## 2017-03-19 NOTE — Progress Notes (Signed)
D: Patient endorses passive SI without a plan. Patient contracts for safety. Patient is sad, depressed, flat on assessment.   A: Patient was assessed by this nurse. Patient received scheduled medications. Q x 15 minute observation checks were completed for safety. Patient was provided with verbal education on provided medications. Patient care plan was reviewed. Patient was offered support and encouragement. Patient was encourage to attend groups, participate in unit activities and continue with plan of care.   R: Patient adheres with scheduled medication. Patient has no complaints of pain at this time. Patient is receptive to treatment and safety maintained on unit.

## 2017-03-19 NOTE — Tx Team (Signed)
Interdisciplinary Treatment and Diagnostic Plan Update  03/19/2017 Time of Session: [place] MRN: [id number]  Principal Diagnosis: Severe recurrent major depression without psychotic features [facility])  Secondary Diagnoses: Principal Problem:   Severe recurrent major depression without psychotic features ([place])   Current Medications:  Current Facility-Administered Medications  Medication Dose Route Frequency Provider Last Rate Last Dose  . acetaminophen (TYLENOL) tablet 650 mg  650 mg Oral Q6H PRN Clapacs, Madie Reno, MD   650 mg at 03/16/17 1138  . alum & mag hydroxide-simeth (MAALOX/MYLANTA) 200-200-20 MG/5ML suspension 30 mL  30 mL Oral Q4H PRN Clapacs, John T, MD      . aspirin EC tablet 81 mg  81 mg Oral Daily Clapacs, Madie Reno, MD   81 mg at 03/19/17 0912  . atorvastatin (LIPITOR) tablet 80 mg  80 mg Oral q1800 Clapacs, Madie Reno, MD   80 mg at 03/17/17 1702  . carvedilol (COREG) tablet 6.25 mg  6.25 mg Oral BID WC Clapacs, Madie Reno, MD   6.25 mg at 03/19/17 0912  . clopidogrel (PLAVIX) tablet 75 mg  75 mg Oral Daily Clapacs, Madie Reno, MD   75 mg at 03/19/17 0912  . divalproex (DEPAKOTE) DR tablet 1,500 mg  1,500 mg Oral QHS Clapacs, John T, MD   1,500 mg at 03/18/17 2048  . escitalopram (LEXAPRO) tablet 20 mg  20 mg Oral Daily Ramond Dial, MD   20 mg at 03/19/17 0911  . hydrOXYzine (ATARAX/VISTARIL) tablet 50 mg  50 mg Oral TID PRN Marylin Crosby, MD   50 mg at 03/17/17 2033  . lurasidone (LATUDA) tablet 40 mg  40 mg Oral Q breakfast Patrecia Pour, NP   40 mg at 03/19/17 0912  . magnesium hydroxide (MILK OF MAGNESIA) suspension 30 mL  30 mL Oral Daily PRN Clapacs, John T, MD      . metFORMIN (GLUCOPHAGE) tablet 500 mg  500 mg Oral BID WC Clapacs, John T, MD   500 mg at 03/19/17 2297  . multivitamin with minerals tablet 1 tablet  1 tablet Oral Daily McNew, Tyson Babinski, MD   1 tablet at 03/19/17 0911  . nicotine (NICODERM CQ - dosed in mg/24 hours) patch 21 mg  21 mg Transdermal  Daily McNew, Tyson Babinski, MD   21 mg at 03/19/17 0912  . prazosin (MINIPRESS) capsule 2 mg  2 mg Oral QHS Clapacs, Madie Reno, MD   2 mg at 03/18/17 2048  . thiamine (VITAMIN B-1) tablet 100 mg  100 mg Oral Daily McNew, Tyson Babinski, MD   100 mg at 03/19/17 0911  . traZODone (DESYREL) tablet 100 mg  100 mg Oral QHS PRN Ramond Dial, MD      . venlafaxine XR (EFFEXOR-XR) 24 hr capsule 75 mg  75 mg Oral Q breakfast Ramond Dial, MD   75 mg at 03/19/17 0911  . zolpidem (AMBIEN) tablet 10 mg  10 mg Oral QHS Ramond Dial, MD   10 mg at 03/18/17 2047   PTA Medications: Medications Prior to Admission  Medication Sig Dispense Refill Last Dose  . aspirin EC 81 MG EC tablet Take 1 tablet (81 mg total) by mouth daily. 30 tablet 0 Taking  . atorvastatin (LIPITOR) 80 MG tablet Take 1 tablet (80 mg total) by mouth daily at 6 PM. 30 tablet 0 Taking  . carvedilol (COREG) 6.25 MG tablet Take 1 tablet (6.25 mg total) by mouth 2 (two) times daily with a meal. 60 tablet 0 Taking  .  clopidogrel (PLAVIX) 75 MG tablet Take 1 tablet (75 mg total) by mouth daily. 30 tablet 0 Taking  . divalproex (DEPAKOTE) 500 MG DR tablet Take 500 mg by mouth 2 (two) times daily.   Taking  . gabapentin (NEURONTIN) 400 MG capsule Take 2 capsules (800 mg total) by mouth 4 (four) times daily. 240 capsule 0 Taking  . hydrOXYzine (ATARAX/VISTARIL) 50 MG tablet Take 50 mg by mouth 3 (three) times daily as needed.   Taking  . metFORMIN (GLUCOPHAGE) 500 MG tablet Take 1 tablet (500 mg total) by mouth 2 (two) times daily with a meal. 60 tablet 0 Taking  . mirtazapine (REMERON) 45 MG tablet Take 1 tablet (45 mg total) by mouth at bedtime. 30 tablet 0 Taking  . nitroGLYCERIN (NITROSTAT) 0.4 MG SL tablet Place 1 tablet (0.4 mg total) under the tongue every 5 (five) minutes x 3 doses as needed. 10 tablet 0 Taking  . prazosin (MINIPRESS) 2 MG capsule Take 2 mg by mouth at bedtime.   Taking  . risperiDONE (RISPERDAL) 2 MG tablet Take 2 mg by  mouth at bedtime.   Taking  . venlafaxine XR (EFFEXOR-XR) 75 MG 24 hr capsule Take 3 capsules (225 mg total) daily with breakfast by mouth. 90 capsule 1     Patient Stressors: Health problems Medication change or noncompliance Occupational concerns Substance abuse  Patient Strengths: Average or above average intelligence Capable of independent living Communication skills Motivation for treatment/growth Supportive family/friends  Treatment Modalities: Medication Management, Group therapy, Case management,  1 to 1 session with clinician, Psychoeducation, Recreational therapy.   Physician Treatment Plan for Primary Diagnosis: Severe recurrent major depression without psychotic features ([place]) Long Term Goal(s): Improvement in symptoms so as ready for discharge   Short Term Goals: Ability to disclose and discuss suicidal ideas Ability to demonstrate self-control will improve  Medication Management: Evaluate patient's response, side effects, and tolerance of medication regimen.  Therapeutic Interventions: 1 to 1 sessions, Unit Group sessions and Medication administration.  Evaluation of Outcomes: Progressing  Physician Treatment Plan for Secondary Diagnosis: Principal Problem:   Severe recurrent major depression without psychotic features ([place])  Long Term Goal(s): Improvement in symptoms so as ready for discharge   Short Term Goals: Ability to disclose and discuss suicidal ideas Ability to demonstrate self-control will improve     Medication Management: Evaluate patient's response, side effects, and tolerance of medication regimen.  Therapeutic Interventions: 1 to 1 sessions, Unit Group sessions and Medication administration.  Evaluation of Outcomes: Progressing   RN Treatment Plan for Primary Diagnosis: Severe recurrent major depression without psychotic features ([place]) Long Term Goal(s): Knowledge of disease and therapeutic regimen to maintain health will improve  Short  Term Goals: Ability to participate in decision making will improve, Ability to identify and develop effective coping behaviors will improve and Compliance with prescribed medications will improve  Medication Management: RN will administer medications as ordered by provider, will assess and evaluate patient's response and provide education to patient for prescribed medication. RN will report any adverse and/or side effects to prescribing provider.  Therapeutic Interventions: 1 on 1 counseling sessions, Psychoeducation, Medication administration, Evaluate responses to treatment, Monitor vital signs and CBGs as ordered, Perform/monitor CIWA, COWS, AIMS and Fall Risk screenings as ordered, Perform wound care treatments as ordered.  Evaluation of Outcomes: Progressing   LCSW Treatment Plan for Primary Diagnosis: Severe recurrent major depression without psychotic features ([place]) Long Term Goal(s): Safe transition to appropriate next level of care at discharge, Engage  patient in therapeutic group addressing interpersonal concerns.  Short Term Goals: Engage patient in aftercare planning with referrals and resources, Identify triggers associated with mental health/substance abuse issues and Increase skills for wellness and recovery  Therapeutic Interventions: Assess for all discharge needs, 1 to 1 time with Social worker, Explore available resources and support systems, Assess for adequacy in community support network, Educate family and significant other(s) on suicide prevention, Complete Psychosocial Assessment, Interpersonal group therapy.  Evaluation of Outcomes: Progressing   Progress in Treatment: Attending groups: No. Participating in groups: No. Taking medication as prescribed: Yes. Toleration medication: Yes. Family/Significant other contact made: No, will contact:    Patient understands diagnosis: Yes. Discussing patient identified problems/goals with staff: Yes. Medical problems  stabilized or resolved: Yes. Denies suicidal/homicidal ideation: Yes. Issues/concerns per patient self-inventory: No. Other:    New problem(s) identified: No, Describe:     New Short Term/Long Term Goal(s): to go to residential treatment program   Discharge Plan or Barriers: CSW still assessing for appropriate referrals.  Reason for Continuation of Hospitalization: Depression Medication stabilization Suicidal ideation  Estimated Length of Stay: 2-3 days  Recreational Therapy: Patient Stressors: SI Patient Goal: Patient will identify 3 healthy leisure activities to participate in post discharge x5 days.  Attendees: Patient: 03/19/2017 11:14 AM  Physician: Dr. Davonna Belling, MD 03/19/2017 11:14 AM  Nursing: Elige Radon, RN 03/19/2017 11:14 AM  RN Care Manager:  03/19/2017 11:14 AM  Social Worker: Alden Hipp, LCSW 03/19/2017 11:14 AM  Recreational Therapist: Roanna Epley, LRT/CTRS 03/19/2017 11:14 AM  Other: Darin Engels, Joice 03/19/2017 11:14 AM  Other:  03/19/2017 11:14 AM  Other: 03/19/2017 11:14 AM     Scribe for Treatment Team: Alden Hipp, LCSW 03/19/2017 11:34 AM

## 2017-03-19 NOTE — Progress Notes (Signed)
Recreation Therapy Notes  Date: 12.24.2018  Time: 1:00pm   Location: Craft Room  Behavioral response: Appropriate  Intervention Topic: Stress  Discussion/Intervention: Group content on today was focused on stress. The group defined stress and way to cope with stress. Participants expressed how they know when they are stresses out. Individuals described the different ways they have to cope with stress. The group stated reasons why it is important to cope with stress. Patient explained what good stress is and some examples. The group participated in the intervention "Less stress", where the group got to work with their peers and not focus on their stressors.   Clinical Observations/Feedback:  Patient came to group and expressed he deals with his stress by sleeping. He participated in the intervention and was social with peers and staff during group.  Afreen Siebels LRT/CTRS         Dawana Asper 03/19/2017 1:49 PM

## 2017-03-19 NOTE — Plan of Care (Signed)
Patient oriented to unit. Patient reports passive Si, contracts for safety.   Education: Knowledge of disease or condition will improve 03/19/2017 0141 - Progressing by Alyson Locket I, RN   Safety: Ability to disclose and discuss suicidal ideas will improve 03/19/2017 0141 - Progressing by Anson [place], RN

## 2017-03-19 NOTE — [facility] Group Notes (Signed)
03/19/2017 9:30AM  Type of Therapy and Topic:  Group Therapy:  Overcoming Obstacles  Participation Level:  Did Not Attend    Description of Group:    In this group patients will be encouraged to explore what they see as obstacles to their own wellness and recovery. They will be guided to discuss their thoughts, feelings, and behaviors related to these obstacles. The group will process together ways to cope with barriers, with attention given to specific choices patients can make. Each patient will be challenged to identify changes they are motivated to make in order to overcome their obstacles. This group will be process-oriented, with patients participating in exploration of their own experiences as well as giving and receiving support and challenge from other group members.   Therapeutic Goals: 1. Patient will identify personal and current obstacles as they relate to admission. 2. Patient will identify barriers that currently interfere with their wellness or overcoming obstacles.  3. Patient will identify feelings, thought process and behaviors related to these barriers. 4. Patient will identify two changes they are willing to make to overcome these obstacles:      Summary of Patient Progress  Patient was encouraged and invited to attend group. Patient did not attend group. Social worker will continue to encourage group participation in the future.    Therapeutic Modalities:   Cognitive Behavioral Therapy Solution Focused Therapy Motivational Interviewing Relapse Prevention Therapy    Darin Engels MSW, [place] 03/19/2017 11:19 AM

## 2017-03-20 MED ORDER — LURASIDONE HCL 40 MG PO TABS
60.0000 mg | ORAL_TABLET | Freq: Every day | ORAL | Status: DC
  Administered 2017-03-21 – 2017-04-02 (×13): 60 mg via ORAL
  Filled 2017-03-20: qty 1
  Filled 2017-03-20: qty 2
  Filled 2017-03-20 (×4): qty 1
  Filled 2017-03-20 (×2): qty 2
  Filled 2017-03-20 (×2): qty 1
  Filled 2017-03-20: qty 2
  Filled 2017-03-20 (×3): qty 1

## 2017-03-20 MED ORDER — PNEUMOCOCCAL VAC POLYVALENT 25 MCG/0.5ML IJ INJ
0.5000 mL | INJECTION | INTRAMUSCULAR | Status: DC
  Filled 2017-03-20: qty 0.5

## 2017-03-20 MED ORDER — INFLUENZA VAC SPLIT QUAD 0.5 ML IM SUSY
0.5000 mL | PREFILLED_SYRINGE | INTRAMUSCULAR | Status: AC
  Administered 2017-03-21: 0.5 mL via INTRAMUSCULAR
  Filled 2017-03-20: qty 0.5

## 2017-03-20 NOTE — Progress Notes (Signed)
[facility] MD Progress Note  03/20/2017 1:33 PM Michael Schmitt  MRN:  [id number] Subjective:  46 yo male who presented to the ED with suicidal ideations.  Continues to endorse persisting suicidal ideation without a plan.  Endorses that no medications are helping at this point.   Keeps lying in bed for most of the day, wakes up only to go to the nursing station on the medication room which happens twice or thrice in a day.  He wakes up to get his meals.  Observed to be sitting by himself and not engaging with other patients. Principal Problem: Severe recurrent major depression without psychotic features ([place]) Diagnosis:   Patient Active Problem List   Diagnosis Date Noted  . Severe recurrent major depression without psychotic features ([place]) [F33.2] 03/12/2017  . Generalized anxiety disorder [F41.1] 02/05/2017  . Chronic neck pain (Primary Area of Pain) (Left) [M54.2, G89.29] 11/22/2016  . Cervical facet syndrome (Left) [U93.235] 11/22/2016  . Cervical radiculitis (Left) [M54.12] 11/22/2016  . History of cervical spinal surgery [Z98.890] 11/22/2016  . Cervical spondylosis [M47.22] 11/22/2016  . Chronic shoulder pain (Secondary Area of Pain) (Left) [M25.512, G89.29] 11/22/2016  . 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 Chronic low back pain [facility] Area of Pain) (Bilateral) (L>R) [M54.5, G89.29] 11/22/2016  . Lumbar facet syndrome (Bilateral) (L>R) [M47.816] 11/22/2016  . Chronic pain syndrome [G89.4] 11/21/2016  . Long term prescription benzodiazepine use [Z79.899] 11/21/2016  . Opiate use [F11.90] 11/21/2016  . Uncontrolled type 2 diabetes mellitus without complication, without long-term current use of insulin ([place]) [E11.65] 10/19/2016  . Chronic Suicidal ideation [R45.851] 09/15/2016  . Coronary artery disease [I25.10] 09/12/2016  . HTN (hypertension) [I10] 06/26/2016  . GERD (gastroesophageal reflux disease) [K21.9] 06/26/2016  . Alcohol use disorder, severe, in early  remission ([place]) [F10.21] 06/26/2016  . Moderate benzodiazepine use disorder ([place]) [F13.20] 06/11/2016  . Sedative, hypnotic or anxiolytic use disorder, severe, dependence ([place]) [F13.20] 05/12/2016  . Ventricular fibrillation ([place]) [I49.01] 04/05/2016  . 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 Cervical spondylosis without myelopathy [M47.812] 07/24/2014  . Severe episode of recurrent major depressive disorder, without psychotic features ([place]) [F33.2] 04/15/2013  . Tobacco use disorder [F17.200] 10/20/2010  . Asthma [J45.909] 10/20/2010   Total Time spent with patient: 30 minutes  Past Psychiatric History: depression, alcohol abuse  Past Medical History:  Past Medical History:  Diagnosis Date  . Anxiety   . Anxiety   . Arthritis    cerv. stenosis, spondylosis, HNP- lower back , has been followed in pain clinic, has  had injection s in cerv. area  . Blood dyscrasia    told that when he was young he was a" free bleeder"  . CAD (coronary artery disease)   . Cervical spondylosis without myelopathy 07/24/2014  . Cervicogenic headache 07/24/2014  . Chronic kidney disease    renal calculi- passed spontaneously  . Depression   . Diabetes mellitus without complication ([place])   . Fatty liver   . GERD (gastroesophageal reflux disease)   . Headache(784.0)   . Hyperlipidemia   . Hypertension   . Mental disorder   . MI, old   . RLS (restless legs syndrome)    detected on sleep study  . Shortness of breath   . Ventricular fibrillation ([place]) 04/05/2016    Past Surgical History:  Procedure Laterality Date  . ANTERIOR CERVICAL DECOMP/DISCECTOMY FUSION  11/13/2011   Procedure: ANTERIOR CERVICAL DECOMPRESSION/DISCECTOMY FUSION 1 LEVEL;  Surgeon: [name], MD;  Location: [facility];  Service: Neurosurgery;  Laterality: N/A;  Anterior Cervical Decompression/discectomy Fusion. Cervical three-four.  Marland Kitchen CARDIAC CATHETERIZATION N/A 01/01/2015   Procedure: Left Heart Cath and Coronary  Angiography;  Surgeon: [name], MD;  Location: [place] [facility];  Service: Cardiovascular;  Laterality: N/A;  . CARDIAC CATHETERIZATION N/A 04/05/2016   Procedure: Left Heart Cath and Coronary Angiography;  Surgeon: Belva Crome, MD;  Location: [place] [facility];  Service: Cardiovascular;  Laterality: N/A;  . CARDIAC CATHETERIZATION N/A 04/05/2016   Procedure: Coronary Stent Intervention;  Surgeon: Belva Crome, MD;  Location: [place] [facility];  Service: Cardiovascular;  Laterality: N/A;  . CARDIAC CATHETERIZATION N/A 04/05/2016   Procedure: Intravascular Ultrasound/IVUS;  Surgeon: Belva Crome, MD;  Location: [place] [facility];  Service: Cardiovascular;  Laterality: N/A;  . NASAL SINUS SURGERY     2005   Family History:  Family History  Problem Relation Age of Onset  . Prostate cancer Father   . Hypertension Mother   . Kidney Stones Mother   . Anxiety disorder Mother   . Depression Mother   . COPD Sister   . Hypertension Sister   . Diabetes Sister   . Depression Sister   . Anxiety disorder Sister   . Seizures Sister   . ADD / ADHD Son   . ADD / ADHD Daughter    Family Psychiatric  History: none Social History:  Social History   Substance and Sexual Activity  Alcohol Use No  . Alcohol/week: 0.0 oz   Comment: last drinked in 3 months.      Social History   Substance and Sexual Activity  Drug Use No    Social History   Socioeconomic History  . Marital status: Widowed    Spouse name: None  . Number of children: 3  . Years of education: 70  . Highest education level: None  Social Needs  . Financial resource strain: None  . Food insecurity - worry: None  . Food insecurity - inability: None  . Transportation needs - medical: None  . Transportation needs - non-medical: None  Occupational History    Comment: unemployed  Tobacco Use  . Smoking status: Current Every Day Smoker    Packs/day: 2.00    Years: 17.00    Pack years: 34.00    Types: Cigarettes,  Cigars  . Smokeless tobacco: Former Systems developer  . Tobacco comment: occassional snuff   Substance and Sexual Activity  . Alcohol use: No    Alcohol/week: 0.0 oz    Comment: last drinked in 3 months.   . Drug use: No  . Sexual activity: Not Currently  Other Topics Concern  . None  Social History Narrative   Patient is right handed.   Patient drinks 2 sodas daily.   Additional Social History:                         Sleep: Fair  Appetite:  Fair  Current Medications: Current Facility-Administered Medications  Medication Dose Route Frequency Provider Last Rate Last Dose  . acetaminophen (TYLENOL) tablet 650 mg  650 mg Oral Q6H PRN Clapacs, Madie Reno, MD   650 mg at 03/16/17 1138  . alum & mag hydroxide-simeth (MAALOX/MYLANTA) 200-200-20 MG/5ML suspension 30 mL  30 mL Oral Q4H PRN Clapacs, John T, MD      . aspirin EC tablet 81 mg  81 mg Oral Daily Clapacs, Madie Reno, MD   81 mg at  03/20/17 0851  . atorvastatin (LIPITOR) tablet 80 mg  80 mg Oral q1800 Clapacs, Madie Reno, MD   80 mg at 03/19/17 1736  . carvedilol (COREG) tablet 6.25 mg  6.25 mg Oral BID WC Clapacs, Madie Reno, MD   6.25 mg at 03/20/17 0851  . clopidogrel (PLAVIX) tablet 75 mg  75 mg Oral Daily Clapacs, Madie Reno, MD   75 mg at 03/20/17 0851  . divalproex (DEPAKOTE) DR tablet 1,500 mg  1,500 mg Oral QHS Clapacs, Madie Reno, MD   1,500 mg at 03/19/17 2125  . escitalopram (LEXAPRO) tablet 20 mg  20 mg Oral Daily Ramond Dial, MD   20 mg at 03/20/17 0938  . hydrOXYzine (ATARAX/VISTARIL) tablet 50 mg  50 mg Oral TID PRN Marylin Crosby, MD   50 mg at 03/17/17 2033  . lurasidone (LATUDA) tablet 40 mg  40 mg Oral Q breakfast Patrecia Pour, NP   40 mg at 03/20/17 1829  . magnesium hydroxide (MILK OF MAGNESIA) suspension 30 mL  30 mL Oral Daily PRN Clapacs, John T, MD      . metFORMIN (GLUCOPHAGE) tablet 500 mg  500 mg Oral BID WC Clapacs, Madie Reno, MD   500 mg at 03/20/17 0851  . multivitamin with minerals tablet 1 tablet  1 tablet Oral  Daily McNew, Tyson Babinski, MD   1 tablet at 03/20/17 [phone]  . nicotine (NICODERM CQ - dosed in mg/24 hours) patch 21 mg  21 mg Transdermal Daily McNew, Tyson Babinski, MD   21 mg at 03/20/17 6967  . prazosin (MINIPRESS) capsule 2 mg  2 mg Oral QHS Clapacs, Madie Reno, MD   2 mg at 03/20/17 0602  . thiamine (VITAMIN B-1) tablet 100 mg  100 mg Oral Daily McNew, Tyson Babinski, MD   100 mg at 03/20/17 8938  . traZODone (DESYREL) tablet 100 mg  100 mg Oral QHS PRN Ramond Dial, MD      . venlafaxine XR (EFFEXOR-XR) 24 hr capsule 75 mg  75 mg Oral Q breakfast Ramond Dial, MD   75 mg at 03/20/17 1017  . zolpidem (AMBIEN) tablet 10 mg  10 mg Oral QHS Ramond Dial, MD   10 mg at 03/19/17 2125    Lab Results: No results found for this or any previous visit (from the past 92 hour(s)).  Blood Alcohol level:  Lab Results  Component Value Date   ETH 37 (H) 03/12/2017   ETH <10 51/04/5850    Metabolic Disorder Labs: Lab Results  Component Value Date   HGBA1C 6.0 (H) 03/13/2017   MPG 126 03/13/2017   MPG 177 09/12/2016   Lab Results  Component Value Date   PROLACTIN 18.1 (H) 09/12/2016   PROLACTIN 6.5 05/27/2016   Lab Results  Component Value Date   CHOL 118 03/13/2017   TRIG 325 (H) 03/13/2017   HDL 31 (L) 03/13/2017   CHOLHDL 3.8 03/13/2017   VLDL 65 (H) 03/13/2017   LDLCALC 22 03/13/2017   LDLCALC 37 09/12/2016    Physical Findings: AIMS: Facial and Oral Movements Muscles of Facial Expression: None, normal Lips and Perioral Area: None, normal Jaw: None, normal Tongue: None, normal,Extremity Movements Upper (arms, wrists, hands, fingers): None, normal Lower (legs, knees, ankles, toes): None, normal, Trunk Movements Neck, shoulders, hips: None, normal, Overall Severity Severity of abnormal movements (highest score from questions above): None, normal Incapacitation due to abnormal movements: None, normal Patient's awareness of abnormal movements (rate only patient's report): No  Awareness, Dental  Status Current problems with teeth and/or dentures?: No Does patient usually wear dentures?: No  CIWA:  CIWA-Ar Total: 0 COWS:  COWS Total Score: 2  Musculoskeletal: Strength & Muscle Tone: within normal limits Gait & Station: normal Patient leans: N/A  Psychiatric Specialty Exam: Physical Exam  Constitutional: He is oriented to person, place, and time. He appears well-developed and well-nourished.  HENT:  Head: Normocephalic.  Neck: Normal range of motion.  Respiratory: Effort normal.  Musculoskeletal: Normal range of motion.  Neurological: He is alert and oriented to person, place, and time.  Psychiatric: His speech is normal and behavior is normal. Judgment normal. Cognition and memory are normal. He exhibits a depressed mood. He expresses suicidal ideation.    Review of Systems  Psychiatric/Behavioral: Positive for depression, substance abuse and suicidal ideas.  All other systems reviewed and are negative.   Blood pressure 132/78, pulse 72, temperature 98.1 F (36.7 C), temperature source Oral, resp. rate 18, height 6\' 2"  (1.88 m), weight 195 lb (88.5 kg), SpO2 97 %.Body mass index is 25.04 kg/m.  General Appearance: Casual  Eye Contact:  Good  Speech:  Normal Rate  Volume:  Normal  Mood:  Depressed  Affect:  Congruent  Thought Process:  Coherent and Descriptions of Associations: Intact  Orientation:  Full (Time, Place, and Person)  Thought Content:  Rumination  Suicidal Thoughts:  Yes.  without intent/plan  Homicidal Thoughts:  No  Memory:  Immediate;   Fair Recent;   Fair Remote;   Fair  Judgement:  Fair  Insight:  Fair  Psychomotor Activity:  Normal  Concentration:  Concentration: Fair and Attention Span: Fair  Recall:  [employer] of Knowledge:  Fair  Language:  Good  Akathisia:  No  Handed:  Right  AIMS (if indicated):     Assets:  Housing Leisure Time Physical Health Resilience Social Support  ADL's:  Intact  Cognition:  WNL   Sleep:  Number of Hours: 7.45    Treatment Plan Summary: 46 yo male with history of multiple admission, chronic depression and SI admitted due to SI and insomnia.  He continues to endorse multiple depressive symptoms, and suicidal ideation.  Has had multiple inpatient hospitalizations for the symptoms.  It is highly possible the patient has treatment refractory depressive symptoms. Patient may possibly benefit from a trial of ECT given severity of depressive symptoms.  I will continue the Depakote, compared to agents like lamictal or second generation antipsychotics Depakote has minimal evidence for prophylaxis of MDD.  However Depakote was started by the previous provider and so I will continue the same.  Essential goal will be to stabilize him on Lexapro and Latuda. Lexapro was started by the previous provider and so I will continue to titrate the same upwards.   Although insomnia continues to be an issue, he benefits from Ambien.  Will D/C mirtazapine for now and leave trazodone as as needed.  Plan:  Mood disorder -Increase Latuda to 60 mg from 03/21/2017 -Reduce Effexor XR to 75 mg mg for depression with the eventual goal of stopping Effexor.   -Depakote 1500 mg qhs continued for mood stabilization (level not obtained during this hospitalization). -Increase Lexapro to 20 mg daily for depression continued with the goal to titrate up as he tapers off the Effexor  Insomnia -Prazosin 2 mg continued for nightmares -Restart Ambien 10 mg at bedtime -Stop mirtazapine to prevent polypharmacy  Alcohol abuse -will have CSW look into treatment options  HTN -Carvedilol 6.25 mg BID  DM -Metformin 500 mg BID  Continue aspirin 81 mg and clopidogrel 75 mg.   Ramond Dial, MD 03/20/2017, 1:33 PM

## 2017-03-20 NOTE — Plan of Care (Signed)
Patient verbalizes understanding of his condition and discharge needs. Patient has the ability to identify changes in his lifestyle as well as the resources available to reduce recurrence of his condition. Patient denies SI/HI/AVH at this time stating "not right now". Patient verbalizes understanding of his therapeutic/medication regimen and has been in compliance. Patient has the ability to identify positive support systems that promote safety but has not disclosed them to this writer as of yet. Patient reports that his anxiety level is at a "10/10" because he's "not good". Patient states that he didn't sleep well last night "I had 30 mg of Ambien, but it didn't help". Patient has been observed interacting out in the milieu with other members on the unit. Patient has remained free from injury thus far and is safe on the unit at this time.

## 2017-03-20 NOTE — Progress Notes (Signed)
D- Patient alert and oriented. Patient presents in a sad/depressed mood on assessment stating that he didn't sleep well. Patient reports his depression and anxiety level is rated as a "10/10" stating that he's "not good". Patient denies SI, HI, AVH, at this time stating "not right now".  A- Scheduled medications administered to patient, per MD orders. Support and encouragement provided.  Routine safety checks conducted every 15 minutes.  Patient informed to notify staff with problems or concerns.  R- No adverse drug reactions noted. Patient contracts for safety at this time. Patient compliant with medications and treatment plan. Patient receptive, calm, and cooperative. Patient interacts well with others on the unit.  Patient remains safe at this time.

## 2017-03-20 NOTE — Progress Notes (Signed)
patient is safe and secure , contract for safety of self and others, encouraged patient to ask for prn medicines for sleep when he feels to have one, appear to spend most his time in the room than interacting with peers, denies any thoughts of  suicide ideations and no signs of AVH at this time, 15 minute check is maintained no distress noted

## 2017-03-20 NOTE — Progress Notes (Signed)
Patient is safe

## 2017-03-20 NOTE — Progress Notes (Signed)
Patient is sleeping long hours and endorses for safety of self and others, takes his medicines with out difficulties, interact less with peers, isolate self most of the time, 15 minute check is maintained no distress noted

## 2017-03-20 NOTE — [facility] Group Notes (Signed)
[facility] Group Notes:  (Nursing/MHT/Case Management/Adjunct)  Date:  03/20/2017  Time:  7:22 AM  Type of Therapy:  Psychoeducational Skills  Participation Level:  Active  Participation Quality:  Appropriate and Attentive  Affect:  Appropriate  Cognitive:  Appropriate  Insight:  Appropriate and Good  Engagement in Group:  Engaged  Modes of Intervention:  Discussion, Socialization and Support  Summary of Progress/Problems:  Michael Schmitt 03/20/2017, 7:22 AM

## 2017-03-21 NOTE — Progress Notes (Signed)
Recreation Therapy Notes   Date: 12.26.2018  Time: 9:30 am   Location: Craft Room  Behavioral response: N/A  Intervention Topic: Team Work  Discussion/Intervention: Patient did not attend group.  Clinical Observations/Feedback:  Patient did not attend group.    Gussie Murton LRT/CTRS            Tumeka Chimenti 03/21/2017 11:07 AM

## 2017-03-21 NOTE — [facility] Group Notes (Signed)
  03/21/2017  Time: 1:00PM  Type of Therapy/Topic:  Group Therapy:  Emotion Regulation  Participation Level:  Minimal   Description of Group:    The purpose of this group is to assist patients in learning to regulate negative emotions and experience positive emotions. Patients will be guided to discuss ways in which they have been vulnerable to their negative emotions. These vulnerabilities will be juxtaposed with experiences of positive emotions or situations, and patients will be challenged to use positive emotions to combat negative ones. Special emphasis will be placed on coping with negative emotions in conflict situations, and patients will process healthy conflict resolution skills.  Therapeutic Goals: 1. Patient will identify two positive emotions or experiences to reflect on in order to balance out negative emotions 2. Patient will label two or more emotions that they find the most difficult to experience 3. Patient will demonstrate positive conflict resolution skills through discussion and/or role plays  Summary of Patient Progress: Pt continues to work towards their tx goals but has not yet reached them. Pt was able to appropriately participate in group discussion, and was able to offer support/validation to other group members. Pt reported he is feeling, "Okay today. I don't know why." Pt reported something he can do to stay safe and calm in the moment is, "reading." Pt reported he feels, "I'm pretty in touch with my emotions, I think."    Therapeutic Modalities:   Cognitive Behavioral Therapy Feelings Identification Dialectical Behavioral Therapy   Alden Hipp, MSW, LCSW 03/21/2017 1:55 PM

## 2017-03-21 NOTE — Progress Notes (Signed)
[facility] MD Progress Note  03/21/2017 10:07 AM Michael Schmitt  MRN:  [id number]   Subjective:  Pt states that he feels "the same." HE continues to feel depressed and have passive SI with no plan. He states that Dr. Davonna Belling spoke to him about ECT and is wondering if this is something that would be helpful. Discussed that he nees psychotherapy and this is not something that he has tried before. HE has been on many medications in the past and none of them have ever been helpful. He is sleeping "okay." He isolates to his room. He is eating okay. He was encouraged to go to groups more often. He is still wanting residential substance treatment.   Principal Problem: Severe recurrent major depression without psychotic features ([place]) Diagnosis:   Patient Active Problem List   Diagnosis Date Noted  . Severe recurrent major depression without psychotic features ([place]) [F33.2] 03/12/2017    Priority: High  . Generalized anxiety disorder [F41.1] 02/05/2017  . Chronic neck pain (Primary Area of Pain) (Left) [M54.2, G89.29] 11/22/2016  . Cervical facet syndrome (Left) [X91.478] 11/22/2016  . Cervical radiculitis (Left) [M54.12] 11/22/2016  . History of cervical spinal surgery [Z98.890] 11/22/2016  . Cervical spondylosis [M47.22] 11/22/2016  . Chronic shoulder pain (Secondary Area of Pain) (Left) [M25.512, G89.29] 11/22/2016  . 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 Chronic low back pain [facility] Area of Pain) (Bilateral) (L>R) [M54.5, G89.29] 11/22/2016  . Lumbar facet syndrome (Bilateral) (L>R) [M47.816] 11/22/2016  . Chronic pain syndrome [G89.4] 11/21/2016  . Long term prescription benzodiazepine use [Z79.899] 11/21/2016  . Opiate use [F11.90] 11/21/2016  . Uncontrolled type 2 diabetes mellitus without complication, without long-term current use of insulin ([place]) [E11.65] 10/19/2016  . Chronic Suicidal ideation [R45.851] 09/15/2016  . Coronary artery disease [I25.10] 09/12/2016  . HTN  (hypertension) [I10] 06/26/2016  . GERD (gastroesophageal reflux disease) [K21.9] 06/26/2016  . Alcohol use disorder, severe, 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[place]) [F10.21] 06/26/2016  . Moderate benzodiazepine use disorder ([place]) [F13.20] 06/11/2016  . Sedative, hypnotic or anxiolytic use disorder, severe, dependence ([place]) [F13.20] 05/12/2016  . Ventricular fibrillation ([place]) [I49.01] 04/05/2016  . 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 Cervical spondylosis without myelopathy [M47.812] 07/24/2014  . Severe episode of recurrent major depressive disorder, without psychotic features ([place]) [F33.2] 04/15/2013  . Tobacco use disorder [F17.200] 10/20/2010  . Asthma [J45.909] 10/20/2010   Total Time spent with patient: 20 minutes  Past Psychiatric History: See H&P  Past Medical History:  Past Medical History:  Diagnosis Date  . Anxiety   . Anxiety   . Arthritis    cerv. stenosis, spondylosis, HNP- lower back , has been followed in pain clinic, has  had injection s in cerv. area  . Blood dyscrasia    told that when he was young he was a" free bleeder"  . CAD (coronary artery disease)   . Cervical spondylosis without myelopathy 07/24/2014  . Cervicogenic headache 07/24/2014  . Chronic kidney disease    renal calculi- passed spontaneously  . Depression   . Diabetes mellitus without complication ([place])   . Fatty liver   . GERD (gastroesophageal reflux disease)   . Headache(784.0)   . Hyperlipidemia   . Hypertension   . Mental disorder   . MI, old   . RLS (restless legs syndrome)    detected on sleep study  . Shortness of breath   . Ventricular fibrillation ([place]) 04/05/2016    Past Surgical History:  Procedure Laterality Date  .  ANTERIOR CERVICAL DECOMP/DISCECTOMY FUSION  11/13/2011   Procedure: ANTERIOR CERVICAL DECOMPRESSION/DISCECTOMY FUSION 1 LEVEL;  Surgeon: [name], MD;  Location: [place] [facility];  Service: Neurosurgery;  Laterality: N/A;  Anterior Cervical Decompression/discectomy  Fusion. Cervical three-four.  Marland Kitchen CARDIAC CATHETERIZATION N/A 01/01/2015   Procedure: Left Heart Cath and Coronary Angiography;  Surgeon: [name], MD;  Location: [place] [facility];  Service: Cardiovascular;  Laterality: N/A;  . CARDIAC CATHETERIZATION N/A 04/05/2016   Procedure: Left Heart Cath and Coronary Angiography;  Surgeon: Belva Crome, MD;  Location: [place] [facility];  Service: Cardiovascular;  Laterality: N/A;  . CARDIAC CATHETERIZATION N/A 04/05/2016   Procedure: Coronary Stent Intervention;  Surgeon: Belva Crome, MD;  Location: [place] [facility];  Service: Cardiovascular;  Laterality: N/A;  . CARDIAC CATHETERIZATION N/A 04/05/2016   Procedure: Intravascular Ultrasound/IVUS;  Surgeon: Belva Crome, MD;  Location: [place] [facility];  Service: Cardiovascular;  Laterality: N/A;  . NASAL SINUS SURGERY     2005   Family History:  Family History  Problem Relation Age of Onset  . Prostate cancer Father   . Hypertension Mother   . Kidney Stones Mother   . Anxiety disorder Mother   . Depression Mother   . COPD Sister   . Hypertension Sister   . Diabetes Sister   . Depression Sister   . Anxiety disorder Sister   . Seizures Sister   . ADD / ADHD Son   . ADD / ADHD Daughter    Family Psychiatric  History: See H&P Social History:  Social History   Substance and Sexual Activity  Alcohol Use No  . Alcohol/week: 0.0 oz   Comment: last drinked in 3 months.      Social History   Substance and Sexual Activity  Drug Use No    Social History   Socioeconomic History  . Marital status: Widowed    Spouse name: None  . Number of children: 3  . Years of education: 79  . Highest education level: None  Social Needs  . Financial resource strain: None  . Food insecurity - worry: None  . Food insecurity - inability: None  . Transportation needs - medical: None  . Transportation needs - non-medical: None  Occupational History    Comment: unemployed  Tobacco Use  .  Smoking status: Current Every Day Smoker    Packs/day: 2.00    Years: 17.00    Pack years: 34.00    Types: Cigarettes, Cigars  . Smokeless tobacco: Former Systems developer  . Tobacco comment: occassional snuff   Substance and Sexual Activity  . Alcohol use: No    Alcohol/week: 0.0 oz    Comment: last drinked in 3 months.   . Drug use: No  . Sexual activity: Not Currently  Other Topics Concern  . None  Social History Narrative   Patient is right handed.   Patient drinks 2 sodas daily.   Additional Social History:                         Sleep: Fair  Appetite:  Fair  Current Medications: Current Facility-Administered Medications  Medication Dose Route Frequency Provider Last Rate Last Dose  . acetaminophen (TYLENOL) tablet 650 mg  650 mg Oral Q6H PRN Clapacs, Madie Reno, MD   650 mg at 03/16/17 1138  . alum & mag hydroxide-simeth (MAALOX/MYLANTA) 200-200-20 MG/5ML suspension 30 mL  30 mL Oral Q4H PRN Clapacs, John T,  MD      . aspirin EC tablet 81 mg  81 mg Oral Daily Clapacs, John T, MD   81 mg at 03/21/17 0820  . atorvastatin (LIPITOR) tablet 80 mg  80 mg Oral q1800 Clapacs, Madie Reno, MD   80 mg at 03/20/17 1743  . carvedilol (COREG) tablet 6.25 mg  6.25 mg Oral BID WC Clapacs, Madie Reno, MD   6.25 mg at 03/21/17 7782  . clopidogrel (PLAVIX) tablet 75 mg  75 mg Oral Daily Clapacs, Madie Reno, MD   75 mg at 03/21/17 0820  . divalproex (DEPAKOTE) DR tablet 1,500 mg  1,500 mg Oral QHS Clapacs, Madie Reno, MD   1,500 mg at 03/20/17 2123  . escitalopram (LEXAPRO) tablet 20 mg  20 mg Oral Daily Ramond Dial, MD   20 mg at 03/21/17 4235  . hydrOXYzine (ATARAX/VISTARIL) tablet 50 mg  50 mg Oral TID PRN Marylin Crosby, MD   50 mg at 03/17/17 2033  . Influenza vac split quadrivalent PF (FLUARIX) injection 0.5 mL  0.5 mL Intramuscular Tomorrow-1000 McNew, Holly R, MD      . Lurasidone HCl TABS 60 mg  60 mg Oral Q breakfast Ramond Dial, MD   60 mg at 03/21/17 3614  . magnesium hydroxide (MILK  OF MAGNESIA) suspension 30 mL  30 mL Oral Daily PRN Clapacs, John T, MD      . metFORMIN (GLUCOPHAGE) tablet 500 mg  500 mg Oral BID WC Clapacs, Madie Reno, MD   500 mg at 03/21/17 4315  . multivitamin with minerals tablet 1 tablet  1 tablet Oral Daily McNew, Tyson Babinski, MD   1 tablet at 03/21/17 0820  . nicotine (NICODERM CQ - dosed in mg/24 hours) patch 21 mg  21 mg Transdermal Daily McNew, Tyson Babinski, MD   21 mg at 03/21/17 0820  . pneumococcal 23 valent vaccine (PNU-IMMUNE) injection 0.5 mL  0.5 mL Intramuscular Tomorrow-1000 McNew, Holly R, MD      . prazosin (MINIPRESS) capsule 2 mg  2 mg Oral QHS Clapacs, Madie Reno, MD   2 mg at 03/20/17 2125  . thiamine (VITAMIN B-1) tablet 100 mg  100 mg Oral Daily McNew, Tyson Babinski, MD   100 mg at 03/21/17 4008  . traZODone (DESYREL) tablet 100 mg  100 mg Oral QHS PRN Ramond Dial, MD      . venlafaxine XR (EFFEXOR-XR) 24 hr capsule 75 mg  75 mg Oral Q breakfast Ramond Dial, MD   75 mg at 03/21/17 6761  . zolpidem (AMBIEN) tablet 10 mg  10 mg Oral QHS Ramond Dial, MD   10 mg at 03/20/17 2123    Lab Results: No results found for this or any previous visit (from the past 63 hour(s)).  Blood Alcohol level:  Lab Results  Component Value Date   ETH 37 (H) 03/12/2017   ETH <10 95/11/3265    Metabolic Disorder Labs: Lab Results  Component Value Date   HGBA1C 6.0 (H) 03/13/2017   MPG 126 03/13/2017   MPG 177 09/12/2016   Lab Results  Component Value Date   PROLACTIN 18.1 (H) 09/12/2016   PROLACTIN 6.5 05/27/2016   Lab Results  Component Value Date   CHOL 118 03/13/2017   TRIG 325 (H) 03/13/2017   HDL 31 (L) 03/13/2017   CHOLHDL 3.8 03/13/2017   VLDL 65 (H) 03/13/2017   LDLCALC 22 03/13/2017   LDLCALC 37 09/12/2016    Physical Findings: AIMS: Facial and Oral Movements Muscles  of Facial Expression: None, normal Lips and Perioral Area: None, normal Jaw: None, normal Tongue: None, normal,Extremity Movements Upper (arms, wrists,  hands, fingers): None, normal Lower (legs, knees, ankles, toes): None, normal, Trunk Movements Neck, shoulders, hips: None, normal, Overall Severity Severity of abnormal movements (highest score from questions above): None, normal Incapacitation due to abnormal movements: None, normal Patient's awareness of abnormal movements (rate only patient's report): No Awareness, Dental Status Current problems with teeth and/or dentures?: No Does patient usually wear dentures?: No  CIWA:  CIWA-Ar Total: 0 COWS:  COWS Total Score: 2  Musculoskeletal: Strength & Muscle Tone: within normal limits Gait & Station: normal Patient leans: Normal  Psychiatric Specialty Exam: Physical Exam  Nursing note and vitals reviewed.   Review of Systems  All other systems reviewed and are negative.   Blood pressure 116/88, pulse 89, temperature 97.8 F (36.6 C), temperature source Oral, resp. rate 18, height 6\' 2"  (1.88 m), weight 88.5 kg (195 lb), SpO2 98 %.Body mass index is 25.04 kg/m.  General Appearance: Casual  Eye Contact:  Good  Speech:  Clear and Coherent  Volume:  Normal  Mood:  Depressed  Affect:  Appropriate  Thought Process:  Coherent and Goal Directed  Orientation:  Full (Time, Place, and Person)  Thought Content:  Negative  Suicidal Thoughts:  Yes.  No plan  Homicidal Thoughts:  No  Memory:  Immediate;   Fair  Judgement:  Fair  Insight:  Lacking  Psychomotor Activity:  Normal  Concentration:  Concentration: Fair  Recall:  [employer] of Knowledge:  Fair  Language:  Fair  Akathisia:  No      Assets:  Resilience  ADL's:  Intact  Cognition:  WNL  Sleep:  Number of Hours: 8.15     Treatment Plan Summary: 46 yo male admitted due to depression and SI. Pt has long history of depression and chronic SI and has had multiple hospitalization with not much benefit. He has been on several medications with no improvement. HE has not had consistent therapy which I think he would greatly benefit  from due to his dependant personality traits.   Plan:  Mood disorder -Continue Latuda 60 mg daily. This was increased today -Continue Effexor XR 75 mg daily. He is in the process of tapering this off -Depakote 1500 mg qhs. May consider tapering off -Continue Lexapro 20 mg daily -Dr. Rondel Jumbo will discuss with patient about option of ECT  Insomnia -Prazosin 2 mg qhs -Ambien 10 mg qhs  Alcohol abuse -CSW looking into treatment options  HTN -Carvedilol 6.25 mg BID  DM -Metformin 500 mg BID  Dispo -CSW looking into residential substance treatment. Dr. Rondel Jumbo will also discuss option of ECT with patient.  Marylin Crosby, MD 03/21/2017, 10:07 AM

## 2017-03-21 NOTE — BHH Group Notes (Signed)
[facility] Group Notes:  (Nursing/MHT/Case Management/Adjunct)  Date:  03/21/2017  Time:  7:11 AM  Type of Therapy:  Psychoeducational Skills  Participation Level:  Active  Participation Quality:  Appropriate and Attentive  Affect:  Appropriate  Cognitive:  Appropriate  Insight:  Appropriate and Good  Engagement in Group:  Engaged  Modes of Intervention:  Discussion, Socialization and Support  Summary of Progress/Problems:  [name] 03/21/2017, 7:11 AM

## 2017-03-21 NOTE — Progress Notes (Signed)
Patient ID: Michael Schmitt, male   DOB: 18-May-1970, 46 y.o.   MRN: [id number] CSW met with pt in order to obtain consent to submit documentation to [facility] and [facility] ([place] as required for their SA residential programs.  Pt gave CSW consent to submit documentation.  CSW informed pt that she would continue to pursue residential programs for him as a part of his  discharge plan.  CSW also informed pt that she had left a message at [facility] regarding his request to cancel his discharge follow-up appointment for 03/26/17.  It appears that the office is closed today.  CSW informed pt that if he wanted to receive his follow-up services with [facility] that he would be required to walk-in for the initial appointment, but he would be able to have subsequent appointments scheduled.  CSW informed pt that he would be given the walk-in information for [facility] prior to him discharging from the hospital.  CSW informed pt that she would continue to pursue residential placements for him.  CSW inquired where would pt discharge to if he is not accepted into a residential program prior to the need for him to discharge from the hospital.  Pt informed CSW that he would go back home to live with his sister and mother.

## 2017-03-21 NOTE — BHH Group Notes (Signed)
[facility] Group Notes:  (Nursing/MHT/Case Management/Adjunct)  Date:  03/21/2017  Time:  9:49 PM  Type of Therapy:  Group Therapy  Participation Level:  Active  Participation Quality:  Appropriate  Affect:  Appropriate  Cognitive:  Appropriate  Insight:  Appropriate  Engagement in Group:  Engaged  Modes of Intervention:  Discussion  Summary of Progress/Problems:  Michael Schmitt 03/21/2017, 9:49 PM

## 2017-03-21 NOTE — Plan of Care (Signed)
  Self-Concept: Level of anxiety will decrease 03/21/2017 2051 - Progressing by Providence Crosby, RN Note Pt stated he was feeling the same, but hopeful for improvement   Self-Concept: Ability to disclose and discuss suicidal ideas will improve 03/21/2017 2051 - Progressing by Providence Crosby, RN Note Pt passive SI-contracts for safety

## 2017-03-21 NOTE — Progress Notes (Signed)
Patient rated his depression and anxiety 10/10 and verbalized passive SI.Denies HI and AVH.Patient looking forward for the ECT.Minimal interactions with staff & peers.Compliant with medications.Appetite and energy level good.Support and encouragement given.

## 2017-03-21 NOTE — Progress Notes (Signed)
Recreation Therapy Notes   Date: 12.26 .2018  Time: 3:00pm  Location: Craft room  Behavioral response: Appropriate  Group Type: Craft  Participation level: Active  Communication: None  Comments: Patient left group early due to unknown reasons and never returned.   Yusif Gnau LRT/CTRS        Praveen Coia 03/21/2017 4:28 PM

## 2017-03-21 NOTE — Plan of Care (Signed)
Patient rated his depression 10/10.Looking forward for ECT.

## 2017-03-21 NOTE — Progress Notes (Signed)
D: Pt denies HI/AVH, passive SI-contracts for safety. Pt is pleasant and cooperative. Pt stated he was feeling the same as he came in, pt said he was waiting to discuss ECT with the doctor.   A: Pt was offered support and encouragement. Pt was given scheduled medications. Pt was encourage to attend groups. Q 15 minute checks were done for safety.   R:Pt attends groups and interacts well with peers and staff. Pt is taking medication. Pt receptive to treatment and safety maintained on unit.

## 2017-03-22 MED ORDER — DIVALPROEX SODIUM 500 MG PO DR TAB
1000.0000 mg | DELAYED_RELEASE_TABLET | Freq: Every day | ORAL | Status: DC
  Administered 2017-03-22 – 2017-03-25 (×4): 1000 mg via ORAL
  Filled 2017-03-22 (×4): qty 2

## 2017-03-22 NOTE — Plan of Care (Signed)
Patient verbalizes understanding of his condition and discharge needs. Patient has the ability to identify resources that can assist him in his health care needs, but is continuing to work towards verbalizing those resources. Patient is improving on what needs to be changed within his lifestyle in order to reduce recurrence of his condition. Patient endorses passive SI without a plan, but does contract for safety with staff at this time. Patient has had two outbursts on the unit today by way of yelling stating that nothing is helping and that he was in pain. Patient denies HI/AVH at this time. Patient has been observed reading the bible in his room to try and calm himself down. Patient has the ability to make decisions regarding his care. Patient has been in compliance with his therapeutic regimen. Patient reports his depression/anxiety level is "10/10" but he doesn't know why. Patient has been attending unit groups with minimal participation. Patient has remained free from injury thus far and is safe on the unit at this time.

## 2017-03-22 NOTE — BHH Group Notes (Signed)
03/22/2017  Time: 1:00PM  Type of Therapy/Topic:  Group Therapy:  Balance in Life  Participation Level:  Minimal  Description of Group:   This group will address the concept of balance and how it feels and looks when one is unbalanced. Patients will be encouraged to process areas in their lives that are out of balance and identify reasons for remaining unbalanced. Facilitators will guide patients in utilizing problem-solving interventions to address and correct the stressor making their life unbalanced. Understanding and applying boundaries will be explored and addressed for obtaining and maintaining a balanced life. Patients will be encouraged to explore ways to assertively make their unbalanced needs known to significant others in their lives, using other group members and facilitator for support and feedback.  Therapeutic Goals: 1. Patient will identify two or more emotions or situations they have that consume much of in their lives. 2. Patient will identify signs/triggers that life has become out of balance:  3. Patient will identify two ways to set boundaries in order to achieve balance in their lives:  4. Patient will demonstrate ability to communicate their needs through discussion and/or role plays  Summary of Patient Progress: Pt continues to work towards their tx goals but has not yet reached them. Pt was able to appropriately participate in group discussion, and was able to offer support/validation to other group members. Pt reported he feels he spends too much time on physical and financial health in his life. Pt reported he would like to devote more attention to keeping a structure in his life. Pt reported one way he can maintain a good life balance is, "to keep on top of everything and make sure I'm staying on it."    Therapeutic Modalities:   Cognitive Behavioral Therapy Solution-Focused Therapy Assertiveness Training   Alden Hipp, MSW, LCSW 03/22/2017 1:50 PM

## 2017-03-22 NOTE — BHH Group Notes (Signed)
[facility] Group Notes:  (Nursing/MHT/Case Management/Adjunct)  Date:  03/22/2017  Time:  9:41 PM  Type of Therapy:  Group Therapy  Participation Level:  Active  Participation Quality:  Appropriate  Affect:  Appropriate  Cognitive:  Alert  Insight:  Good  Engagement in Group:  Engaged  Modes of Intervention:  Activity  Summary of Progress/Problems:  Michael Schmitt 03/22/2017, 9:41 PM

## 2017-03-22 NOTE — Progress Notes (Signed)
D- Patient alert and oriented. Patient presents in sad/depressed moo on assessment stating that he slept good, but he's still depressed. Patient reports his depression/anxiety is a "10/10". Patient states that nothing is working, he doesn't have any coping skills and he is in pain. Patient reports his pain level as "8/10" in his neck stating that it's an aching pain. Patient requested pain medication from this writer. Patient endorses passive SI without a plan and does contract for safety at this time. Patient denies HI, AVH, at this time.   A- Scheduled medications administered to patient, per MD orders. Support and encouragement provided.  Routine safety checks conducted every 15 minutes.  Patient informed to notify staff with problems or concerns.  R- No adverse drug reactions noted. Patient contracts for safety at this time. Patient compliant with medications and treatment plan. Patient receptive, calm, and cooperative. Patient interacts well with others on the unit.  Patient remains safe at this time.

## 2017-03-22 NOTE — BHH Group Notes (Signed)
LCSW Group Therapy Note 03/22/2017 9:00 AM  Type of Therapy and Topic:  Group Therapy:  Setting Goals  Participation Level:  Did Not Attend  Description of Group: In this process group, patients discussed using strengths to work toward goals and address challenges.  Patients identified two positive things about themselves and one goal they were working on.  Patients were given the opportunity to share openly and support each other's plan for self-empowerment.  The group discussed the value of gratitude and were encouraged to have a daily reflection of positive characteristics or circumstances.  Patients were encouraged to identify a plan to utilize their strengths to work on current challenges and goals.  Therapeutic Goals 1. Patient will verbalize personal strengths/positive qualities and relate how these can assist with achieving desired personal goals 2. Patients will verbalize affirmation of peers plans for personal change and goal setting 3. Patients will explore the value of gratitude and positive focus as related to successful achievement of goals 4. Patients will verbalize a plan for regular reinforcement of personal positive qualities and circumstances.  Summary of Patient Progress:       Therapeutic Modalities Cognitive Behavioral Therapy Motivational Interviewing    [name], LCSW 03/22/2017 10:33 AM

## 2017-03-22 NOTE — Plan of Care (Signed)
  Self-Concept: Ability to disclose and discuss suicidal ideas will improve 03/22/2017 2206 - Progressing by Providence Crosby, RN Note Pt passive SI-contracts for safety at this time   Coping: Ability to cope will improve 03/22/2017 2206 - Progressing by Providence Crosby, RN Note Pt stated his depression and anxiety was the same today

## 2017-03-22 NOTE — Progress Notes (Signed)
Recreation Therapy Notes  Date: 12.27.2018  Time: 9:30 am  Location: Craft Room  Behavioral response: N/A  Intervention Topic: Problem Solving  Discussion/Intervention: Patient did not attend group. Clinical Observations/Feedback:  Patient did not attend group. Geneva Barrero LRT/CTRS         Kadir Azucena 03/22/2017 11:12 AM

## 2017-03-22 NOTE — Progress Notes (Signed)
[facility] MD Progress Note  03/22/2017 11:28 AM Michael Schmitt  MRN:  [id number] Subjective:  Pt states that he still feels very depressed and hopeless. He states, "I just feel like dying." Talked to him about his kids and if he misses them. He states, "No, I just don't care about anything." He states that medications have never been helpful for him. HE is adamant that he wants ECT and does not care about any side effects. HE has been isolating to his room and pacing the hallways. He continues to endorse SI.   Principal Problem: Severe recurrent major depression without psychotic features ([place]) Diagnosis:   Patient Active Problem List   Diagnosis Date Noted  . Severe recurrent major depression without psychotic features ([place]) [F33.2] 03/12/2017    Priority: High  . Generalized anxiety disorder [F41.1] 02/05/2017  . Chronic neck pain (Primary Area of Pain) (Left) [M54.2, G89.29] 11/22/2016  . Cervical facet syndrome (Left) [Y48.250] 11/22/2016  . Cervical radiculitis (Left) [M54.12] 11/22/2016  . History of cervical spinal surgery [Z98.890] 11/22/2016  . Cervical spondylosis [M47.22] 11/22/2016  . Chronic shoulder pain (Secondary Area of Pain) (Left) [M25.512, G89.29] 11/22/2016  . 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 Chronic low back pain [facility] Area of Pain) (Bilateral) (L>R) [M54.5, G89.29] 11/22/2016  . Lumbar facet syndrome (Bilateral) (L>R) [M47.816] 11/22/2016  . Chronic pain syndrome [G89.4] 11/21/2016  . Long term prescription benzodiazepine use [Z79.899] 11/21/2016  . Opiate use [F11.90] 11/21/2016  . Uncontrolled type 2 diabetes mellitus without complication, without long-term current use of insulin ([place]) [E11.65] 10/19/2016  . Chronic Suicidal ideation [R45.851] 09/15/2016  . Coronary artery disease [I25.10] 09/12/2016  . HTN (hypertension) [I10] 06/26/2016  . GERD (gastroesophageal reflux disease) [K21.9] 06/26/2016  . Alcohol use disorder, severe, in  early remission ([place]) [F10.21] 06/26/2016  . Moderate benzodiazepine use disorder ([place]) [F13.20] 06/11/2016  . Sedative, hypnotic or anxiolytic use disorder, severe, dependence ([place]) [F13.20] 05/12/2016  . Ventricular fibrillation ([place]) [I49.01] 04/05/2016  . 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 Cervical spondylosis without myelopathy [M47.812] 07/24/2014  . Severe episode of recurrent major depressive disorder, without psychotic features ([place]) [F33.2] 04/15/2013  . Tobacco use disorder [F17.200] 10/20/2010  . Asthma [J45.909] 10/20/2010   Total Time spent with patient: 20 minutes  Past Psychiatric History: See H&P  Past Medical History:  Past Medical History:  Diagnosis Date  . Anxiety   . Anxiety   . Arthritis    cerv. stenosis, spondylosis, HNP- lower back , has been followed in pain clinic, has  had injection s in cerv. area  . Blood dyscrasia    told that when he was young he was a" free bleeder"  . CAD (coronary artery disease)   . Cervical spondylosis without myelopathy 07/24/2014  . Cervicogenic headache 07/24/2014  . Chronic kidney disease    renal calculi- passed spontaneously  . Depression   . Diabetes mellitus without complication ([place])   . Fatty liver   . GERD (gastroesophageal reflux disease)   . Headache(784.0)   . Hyperlipidemia   . Hypertension   . Mental disorder   . MI, old   . RLS (restless legs syndrome)    detected on sleep study  . Shortness of breath   . Ventricular fibrillation ([place]) 04/05/2016    Past Surgical History:  Procedure Laterality Date  . ANTERIOR CERVICAL DECOMP/DISCECTOMY FUSION  11/13/2011   Procedure: ANTERIOR CERVICAL DECOMPRESSION/DISCECTOMY FUSION 1 LEVEL;  Surgeon: [name], MD;  Location: [place]  [facility];  Service: Neurosurgery;  Laterality: N/A;  Anterior Cervical Decompression/discectomy Fusion. Cervical three-four.  Marland Kitchen CARDIAC CATHETERIZATION N/A 01/01/2015   Procedure: Left Heart Cath and Coronary Angiography;   Surgeon: [name], MD;  Location: [place] [facility];  Service: Cardiovascular;  Laterality: N/A;  . CARDIAC CATHETERIZATION N/A 04/05/2016   Procedure: Left Heart Cath and Coronary Angiography;  Surgeon: Belva Crome, MD;  Location: [place] [facility];  Service: Cardiovascular;  Laterality: N/A;  . CARDIAC CATHETERIZATION N/A 04/05/2016   Procedure: Coronary Stent Intervention;  Surgeon: Belva Crome, MD;  Location: [place] [facility];  Service: Cardiovascular;  Laterality: N/A;  . CARDIAC CATHETERIZATION N/A 04/05/2016   Procedure: Intravascular Ultrasound/IVUS;  Surgeon: Belva Crome, MD;  Location: [place] [facility];  Service: Cardiovascular;  Laterality: N/A;  . NASAL SINUS SURGERY     2005   Family History:  Family History  Problem Relation Age of Onset  . Prostate cancer Father   . Hypertension Mother   . Kidney Stones Mother   . Anxiety disorder Mother   . Depression Mother   . COPD Sister   . Hypertension Sister   . Diabetes Sister   . Depression Sister   . Anxiety disorder Sister   . Seizures Sister   . ADD / ADHD Son   . ADD / ADHD Daughter    Family Psychiatric  History: See H&P Social History:  Social History   Substance and Sexual Activity  Alcohol Use No  . Alcohol/week: 0.0 oz   Comment: last drinked in 3 months.      Social History   Substance and Sexual Activity  Drug Use No    Social History   Socioeconomic History  . Marital status: Widowed    Spouse name: None  . Number of children: 3  . Years of education: 13  . Highest education level: None  Social Needs  . Financial resource strain: None  . Food insecurity - worry: None  . Food insecurity - inability: None  . Transportation needs - medical: None  . Transportation needs - non-medical: None  Occupational History    Comment: unemployed  Tobacco Use  . Smoking status: Current Every Day Smoker    Packs/day: 2.00    Years: 17.00    Pack years: 34.00    Types: Cigarettes, Cigars  .  Smokeless tobacco: Former Systems developer  . Tobacco comment: occassional snuff   Substance and Sexual Activity  . Alcohol use: No    Alcohol/week: 0.0 oz    Comment: last drinked in 3 months.   . Drug use: No  . Sexual activity: Not Currently  Other Topics Concern  . None  Social History Narrative   Patient is right handed.   Patient drinks 2 sodas daily.   Additional Social History:                         Sleep: Fair  Appetite:  Fair  Current Medications: Current Facility-Administered Medications  Medication Dose Route Frequency Provider Last Rate Last Dose  . acetaminophen (TYLENOL) tablet 650 mg  650 mg Oral Q6H PRN Clapacs, Madie Reno, MD   650 mg at 03/22/17 0848  . alum & mag hydroxide-simeth (MAALOX/MYLANTA) 200-200-20 MG/5ML suspension 30 mL  30 mL Oral Q4H PRN Clapacs, John T, MD      . aspirin EC tablet 81 mg  81 mg Oral Daily Clapacs, Madie Reno, MD   81 mg  at 03/22/17 0845  . atorvastatin (LIPITOR) tablet 80 mg  80 mg Oral q1800 Clapacs, Madie Reno, MD   80 mg at 03/21/17 1654  . carvedilol (COREG) tablet 6.25 mg  6.25 mg Oral BID WC Clapacs, Madie Reno, MD   6.25 mg at 03/22/17 0846  . clopidogrel (PLAVIX) tablet 75 mg  75 mg Oral Daily Clapacs, Madie Reno, MD   75 mg at 03/22/17 0843  . divalproex (DEPAKOTE) DR tablet 1,000 mg  1,000 mg Oral QHS McNew, Holly R, MD      . escitalopram (LEXAPRO) tablet 20 mg  20 mg Oral Daily Ramond Dial, MD   20 mg at 03/22/17 0843  . hydrOXYzine (ATARAX/VISTARIL) tablet 50 mg  50 mg Oral TID PRN Marylin Crosby, MD   50 mg at 03/17/17 2033  . Lurasidone HCl TABS 60 mg  60 mg Oral Q breakfast Ramond Dial, MD   60 mg at 03/22/17 0844  . magnesium hydroxide (MILK OF MAGNESIA) suspension 30 mL  30 mL Oral Daily PRN Clapacs, John T, MD      . metFORMIN (GLUCOPHAGE) tablet 500 mg  500 mg Oral BID WC Clapacs, Madie Reno, MD   500 mg at 03/22/17 0844  . multivitamin with minerals tablet 1 tablet  1 tablet Oral Daily McNew, Tyson Babinski, MD   1 tablet at  03/22/17 0844  . nicotine (NICODERM CQ - dosed in mg/24 hours) patch 21 mg  21 mg Transdermal Daily McNew, Tyson Babinski, MD   21 mg at 03/22/17 0843  . pneumococcal 23 valent vaccine (PNU-IMMUNE) injection 0.5 mL  0.5 mL Intramuscular Tomorrow-1000 McNew, Holly R, MD      . prazosin (MINIPRESS) capsule 2 mg  2 mg Oral QHS Clapacs, Madie Reno, MD   2 mg at 03/21/17 2123  . thiamine (VITAMIN B-1) tablet 100 mg  100 mg Oral Daily McNew, Tyson Babinski, MD   100 mg at 03/22/17 0845  . traZODone (DESYREL) tablet 100 mg  100 mg Oral QHS PRN Ramond Dial, MD   100 mg at 03/21/17 2123  . venlafaxine XR (EFFEXOR-XR) 24 hr capsule 75 mg  75 mg Oral Q breakfast Ramond Dial, MD   75 mg at 03/22/17 0845  . zolpidem (AMBIEN) tablet 10 mg  10 mg Oral QHS Ramond Dial, MD   10 mg at 03/21/17 2125    Lab Results: No results found for this or any previous visit (from the past 48 hour(s)).  Blood Alcohol level:  Lab Results  Component Value Date   ETH 37 (H) 03/12/2017   ETH <10 78/93/8101    Metabolic Disorder Labs: Lab Results  Component Value Date   HGBA1C 6.0 (H) 03/13/2017   MPG 126 03/13/2017   MPG 177 09/12/2016   Lab Results  Component Value Date   PROLACTIN 18.1 (H) 09/12/2016   PROLACTIN 6.5 05/27/2016   Lab Results  Component Value Date   CHOL 118 03/13/2017   TRIG 325 (H) 03/13/2017   HDL 31 (L) 03/13/2017   CHOLHDL 3.8 03/13/2017   VLDL 65 (H) 03/13/2017   LDLCALC 22 03/13/2017   LDLCALC 37 09/12/2016    Physical Findings: AIMS: Facial and Oral Movements Muscles of Facial Expression: None, normal Lips and Perioral Area: None, normal Jaw: None, normal Tongue: None, normal,Extremity Movements Upper (arms, wrists, hands, fingers): None, normal Lower (legs, knees, ankles, toes): None, normal, Trunk Movements Neck, shoulders, hips: None, normal, Overall Severity Severity of abnormal movements (highest score from questions  above): None, normal Incapacitation due to abnormal  movements: None, normal Patient's awareness of abnormal movements (rate only patient's report): No Awareness, Dental Status Current problems with teeth and/or dentures?: No Does patient usually wear dentures?: No  CIWA:  CIWA-Ar Total: 0 COWS:  COWS Total Score: 2  Musculoskeletal: Strength & Muscle Tone: within normal limits Gait & Station: normal Patient leans: N/A  Psychiatric Specialty Exam: Physical Exam  Nursing note and vitals reviewed.   Review of Systems  All other systems reviewed and are negative.   Blood pressure 128/63, pulse 82, temperature (!) 97.2 F (36.2 C), temperature source Oral, resp. rate 20, height 6\' 2"  (1.88 m), weight 88.5 kg (195 lb), SpO2 98 %.Body mass index is 25.04 kg/m.  General Appearance: Casual  Eye Contact:  Fair  Speech:  Clear and Coherent  Volume:  Normal  Mood:  Depressed  Affect:  Congruent  Thought Process:  Coherent and Goal Directed  Orientation:  Full (Time, Place, and Person)  Thought Content:  Negative  Suicidal Thoughts:  Yes.  without intent/plan  Homicidal Thoughts:  No  Memory:  Immediate;   Fair  Judgement:  Fair  Insight:  Fair  Psychomotor Activity:  EPS  Concentration:  Concentration: Fair  Recall:  [employer] of Knowledge:  Fair  Language:  Fair  Akathisia:  No      Assets:  Resilience  ADL's:  Intact  Cognition:  WNL  Sleep:  Number of Hours: 7     Treatment Plan Summary: 46 yo male admitted due to depression and SI. He has had many hospitalizations and been on many medications with very little benefit. He does not have any routine at home. He continues to endorse SI and hopelessness. He has not had consistent therapy and has dependent personality traits. He is very focused and adamant that he wants ECT.  Plan:  Mood disorder -Continue Latuda 60 mg daily -Continue Effexor XR 75 mg daily. He is in the process of tapering this off -Will taper off Depakote. He has not had any benefit from this and not  indicated for depression. He denies any history of mania -Continue LExapro 20 mg daily -Dr. Rondel Jumbo to discuss ECT with patient  Insomnia -Prazosin 2 mg qhs -Ambien 10 mg qhs  HTN -Carvedilol 6.25 mg BID  DM -Metformin 500 mg BID  Dispo -Dr. Rondel Jumbo to discuss ECT with patient Marylin Crosby, MD 03/22/2017, 11:28 AM

## 2017-03-22 NOTE — Progress Notes (Signed)
D: Pt passive SI-contracts for safety denies HI/AVH. Pt is pleasant and cooperative. Pt spent minimal time on the milieu this evening. Pt has isolated due to his increased depression and anxiety. Pt stated he really wants to try the ECT, pt stated he has done it in the past and really feels it could help him.   A: Pt was offered support and encouragement. Pt was given scheduled medications. Pt was encourage to attend groups. Q 15 minute checks were done for safety.   R: and safety maintained on unit.

## 2017-03-23 NOTE — Tx Team (Signed)
Interdisciplinary Treatment and Diagnostic Plan Update  03/23/2017 Time of Session: 11:00 AM Michael Schmitt MRN: [id number]  Principal Diagnosis: Severe recurrent major depression without psychotic features [facility])  Secondary Diagnoses: Principal Problem:   Severe recurrent major depression without psychotic features ([place])   Current Medications:  Current Facility-Administered Medications  Medication Dose Route Frequency Provider Last Rate Last Dose  . acetaminophen (TYLENOL) tablet 650 mg  650 mg Oral Q6H PRN Clapacs, Madie Reno, MD   650 mg at 03/22/17 0848  . alum & mag hydroxide-simeth (MAALOX/MYLANTA) 200-200-20 MG/5ML suspension 30 mL  30 mL Oral Q4H PRN Clapacs, John T, MD      . aspirin EC tablet 81 mg  81 mg Oral Daily Clapacs, Madie Reno, MD   81 mg at 03/23/17 0825  . atorvastatin (LIPITOR) tablet 80 mg  80 mg Oral q1800 Clapacs, Madie Reno, MD   80 mg at 03/22/17 1745  . carvedilol (COREG) tablet 6.25 mg  6.25 mg Oral BID WC Clapacs, Madie Reno, MD   6.25 mg at 03/23/17 0825  . clopidogrel (PLAVIX) tablet 75 mg  75 mg Oral Daily Clapacs, Madie Reno, MD   75 mg at 03/23/17 0825  . divalproex (DEPAKOTE) DR tablet 1,000 mg  1,000 mg Oral QHS McNew, Tyson Babinski, MD   1,000 mg at 03/22/17 2124  . escitalopram (LEXAPRO) tablet 20 mg  20 mg Oral Daily Ramond Dial, MD   20 mg at 03/23/17 9163  . hydrOXYzine (ATARAX/VISTARIL) tablet 50 mg  50 mg Oral TID PRN Marylin Crosby, MD   50 mg at 03/22/17 1135  . Lurasidone HCl TABS 60 mg  60 mg Oral Q breakfast Ramond Dial, MD   60 mg at 03/23/17 0824  . magnesium hydroxide (MILK OF MAGNESIA) suspension 30 mL  30 mL Oral Daily PRN Clapacs, John T, MD      . metFORMIN (GLUCOPHAGE) tablet 500 mg  500 mg Oral BID WC Clapacs, Madie Reno, MD   500 mg at 03/23/17 0826  . multivitamin with minerals tablet 1 tablet  1 tablet Oral Daily McNew, Tyson Babinski, MD   1 tablet at 03/23/17 [phone]  . nicotine (NICODERM CQ - dosed in mg/24 hours) patch 21 mg  21 mg Transdermal  Daily McNew, Tyson Babinski, MD   21 mg at 03/23/17 0827  . pneumococcal 23 valent vaccine (PNU-IMMUNE) injection 0.5 mL  0.5 mL Intramuscular Tomorrow-1000 McNew, Holly R, MD      . prazosin (MINIPRESS) capsule 2 mg  2 mg Oral QHS Clapacs, Madie Reno, MD   2 mg at 03/22/17 2123  . thiamine (VITAMIN B-1) tablet 100 mg  100 mg Oral Daily McNew, Tyson Babinski, MD   100 mg at 03/23/17 5993  . traZODone (DESYREL) tablet 100 mg  100 mg Oral QHS PRN Ramond Dial, MD   100 mg at 03/22/17 2124  . venlafaxine XR (EFFEXOR-XR) 24 hr capsule 75 mg  75 mg Oral Q breakfast Ramond Dial, MD   75 mg at 03/23/17 5701  . zolpidem (AMBIEN) tablet 10 mg  10 mg Oral QHS Ramond Dial, MD   10 mg at 03/22/17 2124   PTA Medications: Medications Prior to Admission  Medication Sig Dispense Refill Last Dose  . aspirin EC 81 MG EC tablet Take 1 tablet (81 mg total) by mouth daily. 30 tablet 0 Taking  . atorvastatin (LIPITOR) 80 MG tablet Take 1 tablet (80 mg total) by mouth daily at 6 PM. 30 tablet 0 Taking  .  carvedilol (COREG) 6.25 MG tablet Take 1 tablet (6.25 mg total) by mouth 2 (two) times daily with a meal. 60 tablet 0 Taking  . clopidogrel (PLAVIX) 75 MG tablet Take 1 tablet (75 mg total) by mouth daily. 30 tablet 0 Taking  . divalproex (DEPAKOTE) 500 MG DR tablet Take 500 mg by mouth 2 (two) times daily.   Taking  . gabapentin (NEURONTIN) 400 MG capsule Take 2 capsules (800 mg total) by mouth 4 (four) times daily. 240 capsule 0 Taking  . hydrOXYzine (ATARAX/VISTARIL) 50 MG tablet Take 50 mg by mouth 3 (three) times daily as needed.   Taking  . metFORMIN (GLUCOPHAGE) 500 MG tablet Take 1 tablet (500 mg total) by mouth 2 (two) times daily with a meal. 60 tablet 0 Taking  . mirtazapine (REMERON) 45 MG tablet Take 1 tablet (45 mg total) by mouth at bedtime. 30 tablet 0 Taking  . nitroGLYCERIN (NITROSTAT) 0.4 MG SL tablet Place 1 tablet (0.4 mg total) under the tongue every 5 (five) minutes x 3 doses as needed. 10  tablet 0 Taking  . prazosin (MINIPRESS) 2 MG capsule Take 2 mg by mouth at bedtime.   Taking  . risperiDONE (RISPERDAL) 2 MG tablet Take 2 mg by mouth at bedtime.   Taking  . venlafaxine XR (EFFEXOR-XR) 75 MG 24 hr capsule Take 3 capsules (225 mg total) daily with breakfast by mouth. 90 capsule 1     Patient Stressors: Health problems Medication change or noncompliance Occupational concerns Substance abuse  Patient Strengths: Average or above average intelligence Capable of independent living Communication skills Motivation for treatment/growth Supportive family/friends  Treatment Modalities: Medication Management, Group therapy, Case management,  1 to 1 session with clinician, Psychoeducation, Recreational therapy.   Physician Treatment Plan for Primary Diagnosis: Severe recurrent major depression without psychotic features ([place]) Long Term Goal(s): Improvement in symptoms so as ready for discharge   Short Term Goals: Ability to disclose and discuss suicidal ideas Ability to demonstrate self-control will improve  Medication Management: Evaluate patient's response, side effects, and tolerance of medication regimen.  Therapeutic Interventions: 1 to 1 sessions, Unit Group sessions and Medication administration.  Evaluation of Outcomes: Progressing  Physician Treatment Plan for Secondary Diagnosis: Principal Problem:   Severe recurrent major depression without psychotic features ([place])  Long Term Goal(s): Improvement in symptoms so as ready for discharge   Short Term Goals: Ability to disclose and discuss suicidal ideas Ability to demonstrate self-control will improve     Medication Management: Evaluate patient's response, side effects, and tolerance of medication regimen.  Therapeutic Interventions: 1 to 1 sessions, Unit Group sessions and Medication administration.  Evaluation of Outcomes: Progressing   RN Treatment Plan for Primary Diagnosis: Severe recurrent major  depression without psychotic features ([place]) Long Term Goal(s): Knowledge of disease and therapeutic regimen to maintain health will improve  Short Term Goals: Ability to participate in decision making will improve, Ability to identify and develop effective coping behaviors will improve and Compliance with prescribed medications will improve  Medication Management: RN will administer medications as ordered by provider, will assess and evaluate patient's response and provide education to patient for prescribed medication. RN will report any adverse and/or side effects to prescribing provider.  Therapeutic Interventions: 1 on 1 counseling sessions, Psychoeducation, Medication administration, Evaluate responses to treatment, Monitor vital signs and CBGs as ordered, Perform/monitor CIWA, COWS, AIMS and Fall Risk screenings as ordered, Perform wound care treatments as ordered.  Evaluation of Outcomes: Progressing   LCSW Treatment  Plan for Primary Diagnosis: Severe recurrent major depression without psychotic features ([place]) Long Term Goal(s): Safe transition to appropriate next level of care at discharge, Engage patient in therapeutic group addressing interpersonal concerns.  Short Term Goals: Engage patient in aftercare planning with referrals and resources, Identify triggers associated with mental health/substance abuse issues and Increase skills for wellness and recovery  Therapeutic Interventions: Assess for all discharge needs, 1 to 1 time with Social worker, Explore available resources and support systems, Assess for adequacy in community support network, Educate family and significant other(s) on suicide prevention, Complete Psychosocial Assessment, Interpersonal group therapy.  Evaluation of Outcomes: Progressing   Progress in Treatment: Attending groups: No. Participating in groups: No. Taking medication as prescribed: Yes. Toleration medication: Yes. Family/Significant other contact  made: No, will contact:  Two attempts made for Deniel's sister, Olin Hauser with no callbacks. Patient understands diagnosis: Yes. Discussing patient identified problems/goals with staff: Yes. Medical problems stabilized or resolved: Yes. Denies suicidal/homicidal ideation: Yes. Issues/concerns per patient self-inventory: No. Other:    New problem(s) identified: No, Describe:     New Short Term/Long Term Goal(s): to go to residential treatment program   Discharge Plan or Barriers: No availability of residential beds for substance abuse treatment;  No significant improvement in psychiatric symptoms  Reason for Continuation of Hospitalization: Depression Medication stabilization Suicidal ideation  Estimated Length of Stay: 3-5 days  Recreational Therapy: Patient Stressors: SI Patient Goal: Patient will identify 3 healthy leisure activities to participate in post discharge x5 days.  Attendees: Patient: 03/23/2017 11:14 AM  Physician: Amador Cunas, MD 03/23/2017 11:14 AM  Nursing: Polly Cobia, RN 03/23/2017 11:14 AM  RN Care Manager:  03/23/2017 11:14 AM  Social Worker: Derrek Gu, LCSW 03/23/2017 11:14 AM  Recreational Therapist: Roanna Epley, LRT/CTRS 03/23/2017 11:14 AM  Other:  03/23/2017 11:14 AM  Other:  03/23/2017 11:14 AM  Other: 03/23/2017 11:14 AM     Scribe for Treatment Team: [name], LCSW 03/23/2017 2:57 PM

## 2017-03-23 NOTE — [facility] Group Notes (Signed)

## 2017-03-23 NOTE — Progress Notes (Signed)
Patient is alert and oriented, he did talk with [name] late this afternoon, he attended groups, no signs of distress, has better eye contact than this am, he denies Si/hii or avh at this time. q 36min checks in progress for safety.

## 2017-03-23 NOTE — Progress Notes (Signed)
Patient is oriented, came to medication room, flat affect, poor eye contact, states that he does have Si thoughts, but did contract for safety verbally, Patient is cooperative, took morning medications without difficulty, Patient offered encouragement, and safety is maintained in unit.

## 2017-03-23 NOTE — Progress Notes (Addendum)
[facility] MD Progress Note  03/23/2017 9:56 AM Michael Schmitt  MRN:  [id number]   Subjective:  Pt states that he still feels "very depressed." He states that he feels hopeless that nothing will ever help him. Pt is still very deep ingrained in negative thoughts. He continues to report suicidal thoughts with no plan. He states that he does "not care about anything." He is still perseverative on wanting ECT. We did discuss possibility of outpatient ECT and he is going to check with his sister about transportation to help him get to ECT.   Principal Problem: Severe recurrent major depression without psychotic features ([place]) Diagnosis:   Patient Active Problem List   Diagnosis Date Noted  . Severe recurrent major depression without psychotic features ([place]) [F33.2] 03/12/2017    Priority: High  . Generalized anxiety disorder [F41.1] 02/05/2017  . Chronic neck pain (Primary Area of Pain) (Left) [M54.2, G89.29] 11/22/2016  . Cervical facet syndrome (Left) [H67.591] 11/22/2016  . Cervical radiculitis (Left) [M54.12] 11/22/2016  . History of cervical spinal surgery [Z98.890] 11/22/2016  . Cervical spondylosis [M47.22] 11/22/2016  . Chronic shoulder pain (Secondary Area of Pain) (Left) [M25.512, G89.29] 11/22/2016  . 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 Chronic low back pain [facility] Area of Pain) (Bilateral) (L>R) [M54.5, G89.29] 11/22/2016  . Lumbar facet syndrome (Bilateral) (L>R) [M47.816] 11/22/2016  . Chronic pain syndrome [G89.4] 11/21/2016  . Long term prescription benzodiazepine use [Z79.899] 11/21/2016  . Opiate use [F11.90] 11/21/2016  . Uncontrolled type 2 diabetes mellitus without complication, without long-term current use of insulin ([place]) [E11.65] 10/19/2016  . Chronic Suicidal ideation [R45.851] 09/15/2016  . Coronary artery disease [I25.10] 09/12/2016  . HTN (hypertension) [I10] 06/26/2016  . GERD (gastroesophageal reflux disease) [K21.9] 06/26/2016  . Alcohol  use disorder, severe, 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[place]) [F10.21] 06/26/2016  . Moderate benzodiazepine use disorder ([place]) [F13.20] 06/11/2016  . Sedative, hypnotic or anxiolytic use disorder, severe, dependence ([place]) [F13.20] 05/12/2016  . Ventricular fibrillation ([place]) [I49.01] 04/05/2016  . 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 Cervical spondylosis without myelopathy [M47.812] 07/24/2014  . Severe episode of recurrent major depressive disorder, without psychotic features ([place]) [F33.2] 04/15/2013  . Tobacco use disorder [F17.200] 10/20/2010  . Asthma [J45.909] 10/20/2010   Total Time spent with patient: 20 minutes  Past Psychiatric History: See H&P  Past Medical History:  Past Medical History:  Diagnosis Date  . Anxiety   . Anxiety   . Arthritis    cerv. stenosis, spondylosis, HNP- lower back , has been followed in pain clinic, has  had injection s in cerv. area  . Blood dyscrasia    told that when he was young he was a" free bleeder"  . CAD (coronary artery disease)   . Cervical spondylosis without myelopathy 07/24/2014  . Cervicogenic headache 07/24/2014  . Chronic kidney disease    renal calculi- passed spontaneously  . Depression   . Diabetes mellitus without complication ([place])   . Fatty liver   . GERD (gastroesophageal reflux disease)   . Headache(784.0)   . Hyperlipidemia   . Hypertension   . Mental disorder   . MI, old   . RLS (restless legs syndrome)    detected on sleep study  . Shortness of breath   . Ventricular fibrillation ([place]) 04/05/2016    Past Surgical History:  Procedure Laterality Date  . ANTERIOR CERVICAL DECOMP/DISCECTOMY FUSION  11/13/2011   Procedure: ANTERIOR CERVICAL DECOMPRESSION/DISCECTOMY FUSION 1 LEVEL;  Surgeon: [name], MD;  Location:  [facility];  Service: Neurosurgery;  Laterality: N/A;  Anterior Cervical Decompression/discectomy Fusion. Cervical three-four.  Marland Kitchen CARDIAC CATHETERIZATION N/A 01/01/2015   Procedure: Left Heart Cath and  Coronary Angiography;  Surgeon: [name], MD;  Location: [facility];  Service: Cardiovascular;  Laterality: N/A;  . CARDIAC CATHETERIZATION N/A 04/05/2016   Procedure: Left Heart Cath and Coronary Angiography;  Surgeon: Belva Crome, MD;  Location: [facility];  Service: Cardiovascular;  Laterality: N/A;  . CARDIAC CATHETERIZATION N/A 04/05/2016   Procedure: Coronary Stent Intervention;  Surgeon: Belva Crome, MD;  Location: [facility];  Service: Cardiovascular;  Laterality: N/A;  . CARDIAC CATHETERIZATION N/A 04/05/2016   Procedure: Intravascular Ultrasound/IVUS;  Surgeon: Belva Crome, MD;  Location: [facility];  Service: Cardiovascular;  Laterality: N/A;  . NASAL SINUS SURGERY     2005   Family History:  Family History  Problem Relation Age of Onset  . Prostate cancer Father   . Hypertension Mother   . Kidney Stones Mother   . Anxiety disorder Mother   . Depression Mother   . COPD Sister   . Hypertension Sister   . Diabetes Sister   . Depression Sister   . Anxiety disorder Sister   . Seizures Sister   . ADD / ADHD Son   . ADD / ADHD Daughter    Family Psychiatric  History: See H&P Social History:  Social History   Substance and Sexual Activity  Alcohol Use No  . Alcohol/week: 0.0 oz   Comment: last drinked in 3 months.      Social History   Substance and Sexual Activity  Drug Use No    Social History   Socioeconomic History  . Marital status: Widowed    Spouse name: None  . Number of children: 3  . Years of education: 30  . Highest education level: None  Social Needs  . Financial resource strain: None  . Food insecurity - worry: None  . Food insecurity - inability: None  . Transportation needs - medical: None  . Transportation needs - non-medical: None  Occupational History    Comment: unemployed  Tobacco Use  . Smoking status: Current Every Day Smoker    Packs/day: 2.00    Years: 17.00    Pack years: 34.00    Types:  Cigarettes, Cigars  . Smokeless tobacco: Former Systems developer  . Tobacco comment: occassional snuff   Substance and Sexual Activity  . Alcohol use: No    Alcohol/week: 0.0 oz    Comment: last drinked in 3 months.   . Drug use: No  . Sexual activity: Not Currently  Other Topics Concern  . None  Social History Narrative   Patient is right handed.   Patient drinks 2 sodas daily.   Additional Social History:                         Sleep: Fair  Appetite:  Fair  Current Medications: Current Facility-Administered Medications  Medication Dose Route Frequency Provider Last Rate Last Dose  . acetaminophen (TYLENOL) tablet 650 mg  650 mg Oral Q6H PRN Clapacs, Madie Reno, MD   650 mg at 03/22/17 0848  . alum & mag hydroxide-simeth (MAALOX/MYLANTA) 200-200-20 MG/5ML suspension 30 mL  30 mL Oral Q4H PRN Clapacs, John T, MD      . aspirin EC tablet 81 mg  81 mg Oral Daily Clapacs, Madie Reno, MD  81 mg at 03/23/17 0825  . atorvastatin (LIPITOR) tablet 80 mg  80 mg Oral q1800 Clapacs, Madie Reno, MD   80 mg at 03/22/17 1745  . carvedilol (COREG) tablet 6.25 mg  6.25 mg Oral BID WC Clapacs, Madie Reno, MD   6.25 mg at 03/23/17 0825  . clopidogrel (PLAVIX) tablet 75 mg  75 mg Oral Daily Clapacs, Madie Reno, MD   75 mg at 03/23/17 0825  . divalproex (DEPAKOTE) DR tablet 1,000 mg  1,000 mg Oral QHS Arianne Klinge, Tyson Babinski, MD   1,000 mg at 03/22/17 2124  . escitalopram (LEXAPRO) tablet 20 mg  20 mg Oral Daily Ramond Dial, MD   20 mg at 03/23/17 2951  . hydrOXYzine (ATARAX/VISTARIL) tablet 50 mg  50 mg Oral TID PRN Marylin Crosby, MD   50 mg at 03/22/17 1135  . Lurasidone HCl TABS 60 mg  60 mg Oral Q breakfast Ramond Dial, MD   60 mg at 03/23/17 0824  . magnesium hydroxide (MILK OF MAGNESIA) suspension 30 mL  30 mL Oral Daily PRN Clapacs, John T, MD      . metFORMIN (GLUCOPHAGE) tablet 500 mg  500 mg Oral BID WC Clapacs, Madie Reno, MD   500 mg at 03/23/17 0826  . multivitamin with minerals tablet 1 tablet  1 tablet  Oral Daily Sonnia Strong, Tyson Babinski, MD   1 tablet at 03/23/17 [phone]  . nicotine (NICODERM CQ - dosed in mg/24 hours) patch 21 mg  21 mg Transdermal Daily Milayna Rotenberg, Tyson Babinski, MD   21 mg at 03/23/17 0827  . pneumococcal 23 valent vaccine (PNU-IMMUNE) injection 0.5 mL  0.5 mL Intramuscular Tomorrow-1000 Korbin Mapps R, MD      . prazosin (MINIPRESS) capsule 2 mg  2 mg Oral QHS Clapacs, Madie Reno, MD   2 mg at 03/22/17 2123  . thiamine (VITAMIN B-1) tablet 100 mg  100 mg Oral Daily Grayce Budden, Tyson Babinski, MD   100 mg at 03/23/17 6606  . traZODone (DESYREL) tablet 100 mg  100 mg Oral QHS PRN Ramond Dial, MD   100 mg at 03/22/17 2124  . venlafaxine XR (EFFEXOR-XR) 24 hr capsule 75 mg  75 mg Oral Q breakfast Ramond Dial, MD   75 mg at 03/23/17 3016  . zolpidem (AMBIEN) tablet 10 mg  10 mg Oral QHS Ramond Dial, MD   10 mg at 03/22/17 2124    Lab Results: No results found for this or any previous visit (from the past 51 hour(s)).  Blood Alcohol level:  Lab Results  Component Value Date   ETH 37 (H) 03/12/2017   ETH <10 04/04/3233    Metabolic Disorder Labs: Lab Results  Component Value Date   HGBA1C 6.0 (H) 03/13/2017   MPG 126 03/13/2017   MPG 177 09/12/2016   Lab Results  Component Value Date   PROLACTIN 18.1 (H) 09/12/2016   PROLACTIN 6.5 05/27/2016   Lab Results  Component Value Date   CHOL 118 03/13/2017   TRIG 325 (H) 03/13/2017   HDL 31 (L) 03/13/2017   CHOLHDL 3.8 03/13/2017   VLDL 65 (H) 03/13/2017   LDLCALC 22 03/13/2017   LDLCALC 37 09/12/2016    Physical Findings: AIMS: Facial and Oral Movements Muscles of Facial Expression: None, normal Lips and Perioral Area: None, normal Jaw: None, normal Tongue: None, normal,Extremity Movements Upper (arms, wrists, hands, fingers): None, normal Lower (legs, knees, ankles, toes): None, normal, Trunk Movements Neck, shoulders, hips: None, normal, Overall Severity Severity of abnormal  movements (highest score from questions above):  None, normal Incapacitation due to abnormal movements: None, normal Patient's awareness of abnormal movements (rate only patient's report): No Awareness, Dental Status Current problems with teeth and/or dentures?: No Does patient usually wear dentures?: No  CIWA:  CIWA-Ar Total: 0 COWS:  COWS Total Score: 2  Musculoskeletal: Strength & Muscle Tone: within normal limits Gait & Station: normal Patient leans: N/A  Psychiatric Specialty Exam: Physical Exam  Nursing note and vitals reviewed.   Review of Systems  All other systems reviewed and are negative.   Blood pressure 121/74, pulse 70, temperature (!) 97.2 F (36.2 C), temperature source Oral, resp. rate 18, height 6\' 2"  (1.88 m), weight 88.5 kg (195 lb), SpO2 98 %.Body mass index is 25.04 kg/m.  General Appearance: Casual  Eye Contact:  Fair  Speech:  Clear and Coherent  Volume:  Normal  Mood:  Depressed  Affect:  Congruent  Thought Process:  Coherent and Goal Directed  Orientation:  Full (Time, Place, and Person)  Thought Content:  Negative  Suicidal Thoughts:  Yes.  without intent/plan  Homicidal Thoughts:  No  Memory:  Immediate;   Fair  Judgement:  Fair  Insight:  Lacking  Psychomotor Activity:  Normal  Concentration:  Concentration: Fair  Recall:  [employer] of Knowledge:  Fair  Language:  Fair  Akathisia:  No      Assets:  Resilience  ADL's:  Intact  Cognition:  WNL  Sleep:  Number of Hours: 7     Treatment Plan Summary: 46 yo male admitted due to worsening depression and SI. Pt reports no improvement in mood and continues to endorse SI. He has had many past medication trials with no benefit. He has had many hospitalizations and has not had much benefit from hospitalizations.  Plan:  Mood disorder -Continue Latuda 60 mg daily -Continue Effexor XR daily. He is in the process of tapering off this medications -Continue Depakote 1000 mg qhs. We are tapering off this as he has not had any benefit from this  and is not indicated for depression.  -Continue Lexapro 20 mg daily -Dr. Rondel Jumbo to discuss ECT with him.  Insomnia -Prazosin 2 mg qhs -Ambien 10 mg qhs  HTN -Carvedilol 6.25 mg BID  Dispo -Dr. Rondel Jumbo to discuss ECT with patient  Marylin Crosby, MD 03/23/2017, 9:56 AM

## 2017-03-23 NOTE — Consult Note (Signed)
ECT: Patient seen chart reviewed.  Patient well-known to me from multiple prior encounters.  Spoke also with Dr. Wonda Olds today about the patient's condition.  46 year old man with chronic symptoms of depression and anxiety who has been continuing on his downward course with poor functioning and severe symptoms even after getting his disability.  Multiple medications have been trialed with minimal improvement.  Patient is currently in the hospital with symptoms of depression and anxiety and suicidal thoughts.  Patient has requested ECT in the past and we have had difficulty with working out a IT sales professional.  On interview today the patient states that he very much would like to receive ECT.  We talked about risks and benefits and side effects.  I told him that the earliest we would potentially be able to start treatment would be 1 week from today on January 4.  I have spoken with Dr. Wonda Olds as well.  Not sure whether patient clinically still needs to be in the hospital through that entire time.  In the past the patient has had problems with transportation.  He is optimistic that his sister may actually be getting a car now.  In any case I am ordering the EKG and chest x-ray.  I will follow up with him next week and we will tentatively make a plan to begin treatment either inpatient or outpatient next Friday.

## 2017-03-23 NOTE — Progress Notes (Signed)
Patient ID: Michael Schmitt, male   DOB: May 29, 1970, 46 y.o.   MRN: [id number]  CSW re-faxed [facility] application to [employer] due to SA dx not being listed on application initially.  CSW did not receive authorization number back by [facility].  CSW will follow-up on this and continue to explore other SA residential placements per pt's request.

## 2017-03-23 NOTE — Progress Notes (Signed)
Recreation Therapy Notes  Date: 12.28.2018  Time: 9:30 am  Location: Craft Room  Behavioral response: N/A  Intervention Topic: Self-Care  Discussion/Intervention: Patient did not attend group. Clinical Observations/Feedback:  Patient did not attend group.   Michael Schmitt LRT/CTRS             Michael Schmitt 03/23/2017 11:37 AM

## 2017-03-23 NOTE — B[facility] Group Notes (Signed)
[facility] Group Notes:  (Nursing/MHT/Case Management/Adjunct)  Date:  03/23/2017  Time:  5:49 PM  Type of Therapy:  Psychoeducational Skills  Participation Level:  Active  Participation Quality:  Appropriate and Attentive  Affect:  Appropriate  Cognitive:  Appropriate  Insight:  Appropriate and Good  Engagement in Group:  Engaged  Modes of Intervention:  Discussion and Education  Summary of Progress/Problems:  Michael Schmitt 03/23/2017, 5:49 PM

## 2017-03-24 ENCOUNTER — Encounter: Payer: Self-pay | Admitting: Psychiatry

## 2017-03-24 ENCOUNTER — Inpatient Hospital Stay: Payer: Medicare Other

## 2017-03-24 NOTE — Progress Notes (Signed)
Patient was taken up stairs for Chest X-ray, stable and well tolerated returned to room without any issues, patient states that he is looking forward for ECT to begin in a week from today, endorsing depression but denies any suicidal ideations at this time, seeing patient interacting with peers socializing adequately, continue to comply with his medicines regimen, contract for safety of self and others, sleep long hours at a time with out interruptions, 15 minute check is continuing for safety.

## 2017-03-24 NOTE — BHH Group Notes (Signed)
LCSW Group Therapy Note  03/24/2017 1:15pm  Type of Therapy and Topic: Group Therapy: Holding on to Grudges   Participation Level: None   Description of Group:  In this group patients will be asked to explore and define a grudge. Patients will be guided to discuss their thoughts, feelings, and reasons as to why people have grudges. Patients will process the impact grudges have on daily life and identify thoughts and feelings related to holding grudges. Facilitator will challenge patients to identify ways to let go of grudges and the benefits this provides. Patients will be confronted to address why one struggles letting go of grudges. Lastly, patients will identify feelings and thoughts related to what life would look like without grudges. This group will be process-oriented, with patients participating in exploration of their own experiences, giving and receiving support, and processing challenge from other group members.  Therapeutic Goals:  1. Patient will identify specific grudges related to their personal life.  2. Patient will identify feelings, thoughts, and beliefs around grudges.  3. Patient will identify how one releases grudges appropriately.  4. Patient will identify situations where they could have let go of the grudge, but instead chose to hold on.   Summary of Patient Progress:Pt attended group but did not participate.    Therapeutic Modalities:  Cognitive Behavioral Therapy  Solution Focused Therapy  Motivational Interviewing  Brief Therapy   Kaelee Pfeffer  CUEBAS-COLON, LCSW 03/24/2017 10:22 AM

## 2017-03-24 NOTE — [facility] Group Notes (Signed)
[facility] Group Notes:  (Nursing/MHT/Case Management/Adjunct)  Date:  03/24/2017  Time:  10:02 PM  Type of Therapy:  Group Therapy  Participation Level:  Active  Participation Quality:  Appropriate  Affect:  Appropriate  Cognitive:  Alert  Insight:  Good  Engagement in Group:  Engaged  Modes of Intervention:  Support  Summary of Progress/Problems:  Nehemiah Settle 03/24/2017, 10:02 PM

## 2017-03-24 NOTE — Plan of Care (Signed)
Affect flat.  Denies SI.  Continues to endorse depression and rates it as a 10/10.  Minimal interaction with peers or staff.  Attends groups periodically.  Support and encouragement offered.  Safety rounds maintained.

## 2017-03-24 NOTE — Progress Notes (Signed)
[facility] MD Progress Note  03/24/2017 3:05 PM Michael Schmitt  MRN:  [id number]   Subjective:   Michael Schmitt feels slightly more optimistic today. He had a conversation with [name] last night and will star receiving ECT end of next week after the holidays. He has high hopes. There was attempt at ECT in the past but high dose of clonazepam was in the way. He slept better with a combination of Ambien and Trazodone and worries if they will be available at discharge. Tolerates medications well. No somatic complaints. Good group participation.   Treatment plan. We will continue Latuda, Depakote, Hydroxyzine, Minipress, Trazodone and Ambien. ECT next week.  Social/disposition. He will be discharged with family.  Principal Problem: Severe recurrent major depression without psychotic features ([place]) Diagnosis:   Patient Active Problem List   Diagnosis Date Noted  . Severe recurrent major depression without psychotic features ([place]) [F33.2] 03/12/2017    Priority: High  . Generalized anxiety disorder [F41.1] 02/05/2017  . Chronic neck pain (Primary Area of Pain) (Left) [M54.2, G89.29] 11/22/2016  . Cervical facet syndrome (Left) [G64.403] 11/22/2016  . Cervical radiculitis (Left) [M54.12] 11/22/2016  . History of cervical spinal surgery [Z98.890] 11/22/2016  . Cervical spondylosis [M47.22] 11/22/2016  . Chronic shoulder pain (Secondary Area of Pain) (Left) [M25.512, G89.29] 11/22/2016  . 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 Chronic low back pain [employer] Area of Pain) (Bilateral) (L>R) [M54.5, G89.29] 11/22/2016  . Lumbar facet syndrome (Bilateral) (L>R) [M47.816] 11/22/2016  . Chronic pain syndrome [G89.4] 11/21/2016  . Long term prescription benzodiazepine use [Z79.899] 11/21/2016  . Opiate use [F11.90] 11/21/2016  . Uncontrolled type 2 diabetes mellitus without complication, without long-term current use of insulin ([place]) [E11.65] 10/19/2016  . Chronic Suicidal ideation  [R45.851] 09/15/2016  . Coronary artery disease [I25.10] 09/12/2016  . HTN (hypertension) [I10] 06/26/2016  . GERD (gastroesophageal reflux disease) [K21.9] 06/26/2016  . Alcohol use disorder, severe, 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[place]) [F10.21] 06/26/2016  . Moderate benzodiazepine use disorder ([place]) [F13.20] 06/11/2016  . Sedative, hypnotic or anxiolytic use disorder, severe, dependence ([place]) [F13.20] 05/12/2016  . Ventricular fibrillation ([place]) [I49.01] 04/05/2016  . 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 Cervical spondylosis without myelopathy [M47.812] 07/24/2014  . Severe episode of recurrent major depressive disorder, without psychotic features ([place]) [F33.2] 04/15/2013  . Tobacco use disorder [F17.200] 10/20/2010  . Asthma [J45.909] 10/20/2010   Total Time spent with patient: 20 minutes  Past Psychiatric History: See H&P  Past Medical History:  Past Medical History:  Diagnosis Date  . Anxiety   . Anxiety   . Arthritis    cerv. stenosis, spondylosis, HNP- lower back , has been followed in pain clinic, has  had injection s in cerv. area  . Blood dyscrasia    told that when he was young he was a" free bleeder"  . CAD (coronary artery disease)   . Cervical spondylosis without myelopathy 07/24/2014  . Cervicogenic headache 07/24/2014  . Chronic kidney disease    renal calculi- passed spontaneously  . Depression   . Diabetes mellitus without complication ([place])   . Fatty liver   . GERD (gastroesophageal reflux disease)   . Headache(784.0)   . Hyperlipidemia   . Hypertension   . Mental disorder   . MI, old   . RLS (restless legs syndrome)    detected on sleep study  . Shortness of breath   . Ventricular fibrillation ([place]) 04/05/2016    Past Surgical History:  Procedure Laterality Date  .  ANTERIOR CERVICAL DECOMP/DISCECTOMY FUSION  11/13/2011   Procedure: ANTERIOR CERVICAL DECOMPRESSION/DISCECTOMY FUSION 1 LEVEL;  Surgeon: [name], MD;  Location: [facility];  Service:  Neurosurgery;  Laterality: N/A;  Anterior Cervical Decompression/discectomy Fusion. Cervical three-four.  Marland Kitchen CARDIAC CATHETERIZATION N/A 01/01/2015   Procedure: Left Heart Cath and Coronary Angiography;  Surgeon: [name], MD;  Location: [place] CV LAB;  Service: Cardiovascular;  Laterality: N/A;  . CARDIAC CATHETERIZATION N/A 04/05/2016   Procedure: Left Heart Cath and Coronary Angiography;  Surgeon: Belva Crome, MD;  Location: [place] [facility] LAB;  Service: Cardiovascular;  Laterality: N/A;  . CARDIAC CATHETERIZATION N/A 04/05/2016   Procedure: Coronary Stent Intervention;  Surgeon: Belva Crome, MD;  Location: [place] CV LAB;  Service: Cardiovascular;  Laterality: N/A;  . CARDIAC CATHETERIZATION N/A 04/05/2016   Procedure: Intravascular Ultrasound/IVUS;  Surgeon: Belva Crome, MD;  Location: [facility];  Service: Cardiovascular;  Laterality: N/A;  . NASAL SINUS SURGERY     2005   Family History:  Family History  Problem Relation Age of Onset  . Prostate cancer Father   . Hypertension Mother   . Kidney Stones Mother   . Anxiety disorder Mother   . Depression Mother   . COPD Sister   . Hypertension Sister   . Diabetes Sister   . Depression Sister   . Anxiety disorder Sister   . Seizures Sister   . ADD / ADHD Son   . ADD / ADHD Daughter    Family Psychiatric  History: See H&P Social History:  Social History   Substance and Sexual Activity  Alcohol Use No  . Alcohol/week: 0.0 oz   Comment: last drinked in 3 months.      Social History   Substance and Sexual Activity  Drug Use No    Social History   Socioeconomic History  . Marital status: Widowed    Spouse name: None  . Number of children: 3  . Years of education: 35  . Highest education level: None  Social Needs  . Financial resource strain: None  . Food insecurity - worry: None  . Food insecurity - inability: None  . Transportation needs - medical: None  . Transportation needs - non-medical:  None  Occupational History    Comment: unemployed  Tobacco Use  . Smoking status: Current Every Day Smoker    Packs/day: 2.00    Years: 17.00    Pack years: 34.00    Types: Cigarettes, Cigars  . Smokeless tobacco: Former Systems developer  . Tobacco comment: occassional snuff   Substance and Sexual Activity  . Alcohol use: No    Alcohol/week: 0.0 oz    Comment: last drinked in 3 months.   . Drug use: No  . Sexual activity: Not Currently  Other Topics Concern  . None  Social History Narrative   Patient is right handed.   Patient drinks 2 sodas daily.   Additional Social History:                         Sleep: Good  Appetite:  Fair  Current Medications: Current Facility-Administered Medications  Medication Dose Route Frequency Provider Last Rate Last Dose  . acetaminophen (TYLENOL) tablet 650 mg  650 mg Oral Q6H PRN Clapacs, Madie Reno, MD   650 mg at 03/22/17 0848  . alum & mag hydroxide-simeth (MAALOX/MYLANTA) 200-200-20 MG/5ML suspension 30 mL  30 mL Oral Q4H PRN Clapacs, John T,  MD      . aspirin EC tablet 81 mg  81 mg Oral Daily Clapacs, Madie Reno, MD   81 mg at 03/24/17 0857  . atorvastatin (LIPITOR) tablet 80 mg  80 mg Oral q1800 Clapacs, Madie Reno, MD   80 mg at 03/23/17 1711  . carvedilol (COREG) tablet 6.25 mg  6.25 mg Oral BID WC Clapacs, Madie Reno, MD   6.25 mg at 03/24/17 0857  . clopidogrel (PLAVIX) tablet 75 mg  75 mg Oral Daily Clapacs, Madie Reno, MD   75 mg at 03/24/17 0857  . divalproex (DEPAKOTE) DR tablet 1,000 mg  1,000 mg Oral QHS McNew, Tyson Babinski, MD   1,000 mg at 03/23/17 2118  . escitalopram (LEXAPRO) tablet 20 mg  20 mg Oral Daily Ramond Dial, MD   20 mg at 03/24/17 0857  . hydrOXYzine (ATARAX/VISTARIL) tablet 50 mg  50 mg Oral TID PRN Marylin Crosby, MD   50 mg at 03/22/17 1135  . Lurasidone HCl TABS 60 mg  60 mg Oral Q breakfast Ramond Dial, MD   60 mg at 03/24/17 0857  . magnesium hydroxide (MILK OF MAGNESIA) suspension 30 mL  30 mL Oral Daily PRN  Clapacs, John T, MD      . metFORMIN (GLUCOPHAGE) tablet 500 mg  500 mg Oral BID WC Clapacs, Madie Reno, MD   500 mg at 03/24/17 0857  . multivitamin with minerals tablet 1 tablet  1 tablet Oral Daily McNew, Tyson Babinski, MD   1 tablet at 03/24/17 0856  . nicotine (NICODERM CQ - dosed in mg/24 hours) patch 21 mg  21 mg Transdermal Daily McNew, Tyson Babinski, MD   21 mg at 03/24/17 0857  . pneumococcal 23 valent vaccine (PNU-IMMUNE) injection 0.5 mL  0.5 mL Intramuscular Tomorrow-1000 McNew, Holly R, MD      . prazosin (MINIPRESS) capsule 2 mg  2 mg Oral QHS Clapacs, Madie Reno, MD   2 mg at 03/23/17 2118  . thiamine (VITAMIN B-1) tablet 100 mg  100 mg Oral Daily McNew, Tyson Babinski, MD   100 mg at 03/24/17 0857  . traZODone (DESYREL) tablet 100 mg  100 mg Oral QHS PRN Ramond Dial, MD   100 mg at 03/22/17 2124  . venlafaxine XR (EFFEXOR-XR) 24 hr capsule 75 mg  75 mg Oral Q breakfast Ramond Dial, MD   75 mg at 03/24/17 0856  . zolpidem (AMBIEN) tablet 10 mg  10 mg Oral QHS Ramond Dial, MD   10 mg at 03/23/17 2118    Lab Results: No results found for this or any previous visit (from the past 12 hour(s)).  Blood Alcohol level:  Lab Results  Component Value Date   ETH 37 (H) 03/12/2017   ETH <10 35/57/3220    Metabolic Disorder Labs: Lab Results  Component Value Date   HGBA1C 6.0 (H) 03/13/2017   MPG 126 03/13/2017   MPG 177 09/12/2016   Lab Results  Component Value Date   PROLACTIN 18.1 (H) 09/12/2016   PROLACTIN 6.5 05/27/2016   Lab Results  Component Value Date   CHOL 118 03/13/2017   TRIG 325 (H) 03/13/2017   HDL 31 (L) 03/13/2017   CHOLHDL 3.8 03/13/2017   VLDL 65 (H) 03/13/2017   LDLCALC 22 03/13/2017   LDLCALC 37 09/12/2016    Physical Findings: AIMS: Facial and Oral Movements Muscles of Facial Expression: None, normal Lips and Perioral Area: None, normal Jaw: None, normal Tongue: None, normal,Extremity Movements Upper (arms, wrists,  hands, fingers): None,  normal Lower (legs, knees, ankles, toes): None, normal, Trunk Movements Neck, shoulders, hips: None, normal, Overall Severity Severity of abnormal movements (highest score from questions above): None, normal Incapacitation due to abnormal movements: None, normal Patient's awareness of abnormal movements (rate only patient's report): No Awareness, Dental Status Current problems with teeth and/or dentures?: No Does patient usually wear dentures?: No  CIWA:  CIWA-Ar Total: 0 COWS:  COWS Total Score: 2  Musculoskeletal: Strength & Muscle Tone: within normal limits Gait & Station: normal Patient leans: N/A  Psychiatric Specialty Exam: Physical Exam  Nursing note and vitals reviewed. Psychiatric: His speech is normal and behavior is normal. Thought content normal. His affect is blunt. Cognition and memory are normal. He expresses impulsivity. He exhibits a depressed mood.    Review of Systems  Neurological: Negative.   Psychiatric/Behavioral: Positive for depression.  All other systems reviewed and are negative.   Blood pressure 120/85, pulse 70, temperature 97.7 F (36.5 C), temperature source Oral, resp. rate 18, height 6\' 2"  (1.88 m), weight 88.5 kg (195 lb), SpO2 98 %.Body mass index is 25.04 kg/m.  General Appearance: Casual  Eye Contact:  Good  Speech:  Clear and Coherent  Volume:  Normal  Mood:  Depressed  Affect:  Blunt  Thought Process:  Coherent, Goal Directed and Descriptions of Associations: Intact  Orientation:  Full (Time, Place, and Person)  Thought Content:  WDL  Suicidal Thoughts:  Yes.  without intent/plan  Homicidal Thoughts:  No  Memory:  Immediate;   Fair Recent;   Fair Remote;   Fair  Judgement:  Fair  Insight:  Lacking  Psychomotor Activity:  Normal  Concentration:  Concentration: Fair and Attention Span: Fair  Recall:  [employer] of Knowledge:  Fair  Language:  Fair  Akathisia:  No      Assets:  Communication Skills Desire for  Improvement Financial Resources/Insurance Housing Resilience Social Support  ADL's:  Intact  Cognition:  WNL  Sleep:  Number of Hours: 6.45     Treatment Plan Summary: 46 yo male admitted due to worsening depression and SI. Pt reports no improvement in mood and continues to endorse SI. He has had many past medication trials with no benefit. He has had many hospitalizations and has not had much benefit from hospitalizations.  Plan:  Mood disorder -Continue Latuda 60 mg daily -Continue Effexor XR daily. He is in the process of tapering off this medications -Continue Depakote 1000 mg qhs. We are tapering off this as he has not had any benefit from this and is not indicated for depression.  -Continue Lexapro 20 mg daily -[name] will start ECT next week.  Insomnia -Prazosin 2 mg qhs -Ambien 10 mg qhs  HTN -Carvedilol 6.25 mg BID  Dispo -Dr. Rondel Jumbo to discuss ECT with patient  Orson Slick, MD 03/24/2017, 3:05 PM

## 2017-03-25 NOTE — Progress Notes (Signed)
Cooperative with treatment, pleasant on approach, he denies SI but he  continues to endorse depression and rates it as a 10/10.  Minimal interaction with peers or staff. Attended group on shift.  No behavioral issues to report on shift at this time. He appears to be in bed resting quietly at this time.

## 2017-03-25 NOTE — [facility] Group Notes (Signed)
LCSW Group Therapy Note 03/25/2017 1:15pm Type of Therapy and Topic: Group Therapy: Feelings Around Returning Home & Establishing a Supportive Framework and Supporting Oneself When Supports Not Available Participation Level: None Description of Group:  Patients first processed thoughts and feelings about upcoming discharge. These included fears of upcoming changes, lack of change, new living environments, judgements and expectations from others and overall stigma of mental health issues. The group then discussed the definition of a supportive framework, what that looks and feels like, and how do to discern it from an unhealthy non-supportive network. The group identified different types of supports as well as what to do when your family/friends are less than helpful or unavailable  Therapeutic Goals  1. Patient will identify one healthy supportive network that they can use at discharge. 2. Patient will identify one factor of a supportive framework and how to tell it from an unhealthy network. 3. Patient able to identify one coping skill to use when they do not have positive supports from others. 4. Patient will demonstrate ability to communicate their needs through discussion and/or role plays.  Summary of Patient Progress: Pt attended group but did not participate.     Therapeutic Modalities Cognitive Behavioral Therapy Motivational Interviewing   Alexandrina Fiorini  CUEBAS-COLON, LCSW 03/25/2017 9:29 AM

## 2017-03-25 NOTE — [facility] Group Notes (Signed)
[place] Group Notes:  (Nursing/MHT/Case Management/Adjunct)  Date:  03/25/2017  Time:  9:27 PM  Type of Therapy:  Group Therapy  Participation Level:  Active  Participation Quality:  Appropriate  Affect:  Appropriate  Cognitive:  Appropriate  Insight:  Appropriate  Engagement in Group:  Engaged  Modes of Intervention:  Discussion  Summary of Progress/Problems:  Michael Schmitt 03/25/2017, 9:27 PM

## 2017-03-25 NOTE — Progress Notes (Signed)
[facility] MD Progress Note  03/25/2017 12:12 PM Michael Schmitt  MRN:  [id number]   Subjective:    Michael Schmitt still depressed 10/10 but slightly more optimistic about the future awaiting ECT. No somatic complaints. Tolerates medications well. Sleep and appetite are fair.    Treatment plan. We will continue Latuda, Depakote, Hydroxyzine, Minipress, Trazodone and Ambien. ECT next week.  Social/disposition. He will be discharged with family.  Principal Problem: Severe recurrent major depression without psychotic features ([place]) Diagnosis:   Patient Active Problem List   Diagnosis Date Noted  . Severe recurrent major depression without psychotic features ([place]) [F33.2] 03/12/2017    Priority: High  . Generalized anxiety disorder [F41.1] 02/05/2017  . Chronic neck pain (Primary Area of Pain) (Left) [M54.2, G89.29] 11/22/2016  . Cervical facet syndrome (Left) [Y69.485] 11/22/2016  . Cervical radiculitis (Left) [M54.12] 11/22/2016  . History of cervical spinal surgery [Z98.890] 11/22/2016  . Cervical spondylosis [M47.22] 11/22/2016  . Chronic shoulder pain (Secondary Area of Pain) (Left) [M25.512, G89.29] 11/22/2016  . 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 Chronic low back pain [facility] Area of Pain) (Bilateral) (L>R) [M54.5, G89.29] 11/22/2016  . Lumbar facet syndrome (Bilateral) (L>R) [M47.816] 11/22/2016  . Chronic pain syndrome [G89.4] 11/21/2016  . Long term prescription benzodiazepine use [Z79.899] 11/21/2016  . Opiate use [F11.90] 11/21/2016  . Uncontrolled type 2 diabetes mellitus without complication, without long-term current use of insulin ([place]) [E11.65] 10/19/2016  . Chronic Suicidal ideation [R45.851] 09/15/2016  . Coronary artery disease [I25.10] 09/12/2016  . HTN (hypertension) [I10] 06/26/2016  . GERD (gastroesophageal reflux disease) [K21.9] 06/26/2016  . Alcohol use disorder, severe, 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[place]) [F10.21] 06/26/2016  . Moderate  benzodiazepine use disorder ([place]) [F13.20] 06/11/2016  . Sedative, hypnotic or anxiolytic use disorder, severe, dependence ([place]) [F13.20] 05/12/2016  . Ventricular fibrillation ([place]) [I49.01] 04/05/2016  . 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 Cervical spondylosis without myelopathy [M47.812] 07/24/2014  . Severe episode of recurrent major depressive disorder, without psychotic features ([place]) [F33.2] 04/15/2013  . Tobacco use disorder [F17.200] 10/20/2010  . Asthma [J45.909] 10/20/2010   Total Time spent with patient: 20 minutes  Past Psychiatric History: See H&P  Past Medical History:  Past Medical History:  Diagnosis Date  . Anxiety   . Anxiety   . Arthritis    cerv. stenosis, spondylosis, HNP- lower back , has been followed in pain clinic, has  had injection s in cerv. area  . Blood dyscrasia    told that when he was young he was a" free bleeder"  . CAD (coronary artery disease)   . Cervical spondylosis without myelopathy 07/24/2014  . Cervicogenic headache 07/24/2014  . Chronic kidney disease    renal calculi- passed spontaneously  . Depression   . Diabetes mellitus without complication ([place])   . Fatty liver   . GERD (gastroesophageal reflux disease)   . Headache(784.0)   . Hyperlipidemia   . Hypertension   . Mental disorder   . MI, old   . RLS (restless legs syndrome)    detected on sleep study  . Shortness of breath   . Ventricular fibrillation ([place]) 04/05/2016    Past Surgical History:  Procedure Laterality Date  . ANTERIOR CERVICAL DECOMP/DISCECTOMY FUSION  11/13/2011   Procedure: ANTERIOR CERVICAL DECOMPRESSION/DISCECTOMY FUSION 1 LEVEL;  Surgeon: [name], MD;  Location: [place] [facility]URO ORS;  Service: Neurosurgery;  Laterality: N/A;  Anterior Cervical Decompression/discectomy Fusion. Cervical three-four.  Marland Kitchen CARDIAC CATHETERIZATION N/A 01/01/2015   Procedure: Left  Heart Cath and Coronary Angiography;  Surgeon: [name], MD;  Location: [place] CV LAB;   Service: Cardiovascular;  Laterality: N/A;  . CARDIAC CATHETERIZATION N/A 04/05/2016   Procedure: Left Heart Cath and Coronary Angiography;  Surgeon: Belva Crome, MD;  Location: [place] [facility] LAB;  Service: Cardiovascular;  Laterality: N/A;  . CARDIAC CATHETERIZATION N/A 04/05/2016   Procedure: Coronary Stent Intervention;  Surgeon: Belva Crome, MD;  Location: [place] CV LAB;  Service: Cardiovascular;  Laterality: N/A;  . CARDIAC CATHETERIZATION N/A 04/05/2016   Procedure: Intravascular Ultrasound/IVUS;  Surgeon: Belva Crome, MD;  Location: [place] CV LAB;  Service: Cardiovascular;  Laterality: N/A;  . NASAL SINUS SURGERY     2005   Family History:  Family History  Problem Relation Age of Onset  . Prostate cancer Father   . Hypertension Mother   . Kidney Stones Mother   . Anxiety disorder Mother   . Depression Mother   . COPD Sister   . Hypertension Sister   . Diabetes Sister   . Depression Sister   . Anxiety disorder Sister   . Seizures Sister   . ADD / ADHD Son   . ADD / ADHD Daughter    Family Psychiatric  History: See H&P Social History:  Social History   Substance and Sexual Activity  Alcohol Use No  . Alcohol/week: 0.0 oz   Comment: last drinked in 3 months.      Social History   Substance and Sexual Activity  Drug Use No    Social History   Socioeconomic History  . Marital status: Widowed    Spouse name: None  . Number of children: 3  . Years of education: 31  . Highest education level: None  Social Needs  . Financial resource strain: None  . Food insecurity - worry: None  . Food insecurity - inability: None  . Transportation needs - medical: None  . Transportation needs - non-medical: None  Occupational History    Comment: unemployed  Tobacco Use  . Smoking status: Current Every Day Smoker    Packs/day: 2.00    Years: 17.00    Pack years: 34.00    Types: Cigarettes, Cigars  . Smokeless tobacco: Former Systems developer  . Tobacco comment:  occassional snuff   Substance and Sexual Activity  . Alcohol use: No    Alcohol/week: 0.0 oz    Comment: last drinked in 3 months.   . Drug use: No  . Sexual activity: Not Currently  Other Topics Concern  . None  Social History Narrative   Patient is right handed.   Patient drinks 2 sodas daily.   Additional Social History:                         Sleep: Good  Appetite:  Fair  Current Medications: Current Facility-Administered Medications  Medication Dose Route Frequency Provider Last Rate Last Dose  . acetaminophen (TYLENOL) tablet 650 mg  650 mg Oral Q6H PRN Clapacs, Madie Reno, MD   650 mg at 03/22/17 0848  . alum & mag hydroxide-simeth (MAALOX/MYLANTA) 200-200-20 MG/5ML suspension 30 mL  30 mL Oral Q4H PRN Clapacs, John T, MD      . aspirin EC tablet 81 mg  81 mg Oral Daily Clapacs, Madie Reno, MD   81 mg at 03/25/17 0843  . atorvastatin (LIPITOR) tablet 80 mg  80 mg Oral q1800 Clapacs, Madie Reno, MD   80 mg at  03/24/17 1712  . carvedilol (COREG) tablet 6.25 mg  6.25 mg Oral BID WC Clapacs, Madie Reno, MD   6.25 mg at 03/25/17 0843  . clopidogrel (PLAVIX) tablet 75 mg  75 mg Oral Daily Clapacs, Madie Reno, MD   75 mg at 03/25/17 0843  . divalproex (DEPAKOTE) DR tablet 1,000 mg  1,000 mg Oral QHS McNew, Tyson Babinski, MD   1,000 mg at 03/24/17 2128  . escitalopram (LEXAPRO) tablet 20 mg  20 mg Oral Daily Ramond Dial, MD   20 mg at 03/25/17 5027  . hydrOXYzine (ATARAX/VISTARIL) tablet 50 mg  50 mg Oral TID PRN Marylin Crosby, MD   50 mg at 03/22/17 1135  . Lurasidone HCl TABS 60 mg  60 mg Oral Q breakfast Ramond Dial, MD   60 mg at 03/25/17 0843  . magnesium hydroxide (MILK OF MAGNESIA) suspension 30 mL  30 mL Oral Daily PRN Clapacs, John T, MD      . metFORMIN (GLUCOPHAGE) tablet 500 mg  500 mg Oral BID WC Clapacs, Madie Reno, MD   500 mg at 03/25/17 [phone]  . multivitamin with minerals tablet 1 tablet  1 tablet Oral Daily McNew, Tyson Babinski, MD   1 tablet at 03/25/17 [phone]  . nicotine  (NICODERM CQ - dosed in mg/24 hours) patch 21 mg  21 mg Transdermal Daily McNew, Tyson Babinski, MD   21 mg at 03/25/17 0843  . pneumococcal 23 valent vaccine (PNU-IMMUNE) injection 0.5 mL  0.5 mL Intramuscular Tomorrow-1000 McNew, Holly R, MD      . prazosin (MINIPRESS) capsule 2 mg  2 mg Oral QHS Clapacs, Madie Reno, MD   2 mg at 03/24/17 2128  . thiamine (VITAMIN B-1) tablet 100 mg  100 mg Oral Daily McNew, Tyson Babinski, MD   100 mg at 03/25/17 0843  . traZODone (DESYREL) tablet 100 mg  100 mg Oral QHS PRN Ramond Dial, MD   100 mg at 03/24/17 2129  . venlafaxine XR (EFFEXOR-XR) 24 hr capsule 75 mg  75 mg Oral Q breakfast Ramond Dial, MD   75 mg at 03/25/17 7672  . zolpidem (AMBIEN) tablet 10 mg  10 mg Oral QHS Ramond Dial, MD   10 mg at 03/24/17 2128    Lab Results: No results found for this or any previous visit (from the past 55 hour(s)).  Blood Alcohol level:  Lab Results  Component Value Date   ETH 37 (H) 03/12/2017   ETH <10 09/47/0962    Metabolic Disorder Labs: Lab Results  Component Value Date   HGBA1C 6.0 (H) 03/13/2017   MPG 126 03/13/2017   MPG 177 09/12/2016   Lab Results  Component Value Date   PROLACTIN 18.1 (H) 09/12/2016   PROLACTIN 6.5 05/27/2016   Lab Results  Component Value Date   CHOL 118 03/13/2017   TRIG 325 (H) 03/13/2017   HDL 31 (L) 03/13/2017   CHOLHDL 3.8 03/13/2017   VLDL 65 (H) 03/13/2017   LDLCALC 22 03/13/2017   LDLCALC 37 09/12/2016    Physical Findings: AIMS: Facial and Oral Movements Muscles of Facial Expression: None, normal Lips and Perioral Area: None, normal Jaw: None, normal Tongue: None, normal,Extremity Movements Upper (arms, wrists, hands, fingers): None, normal Lower (legs, knees, ankles, toes): None, normal, Trunk Movements Neck, shoulders, hips: None, normal, Overall Severity Severity of abnormal movements (highest score from questions above): None, normal Incapacitation due to abnormal movements: None,  normal Patient's awareness of abnormal movements (rate only patient's report): No  Awareness, Dental Status Current problems with teeth and/or dentures?: No Does patient usually wear dentures?: No  CIWA:  CIWA-Ar Total: 0 COWS:  COWS Total Score: 2  Musculoskeletal: Strength & Muscle Tone: within normal limits Gait & Station: normal Patient leans: N/A  Psychiatric Specialty Exam: Physical Exam  Nursing note and vitals reviewed. Psychiatric: His speech is normal and behavior is normal. Thought content normal. His affect is blunt. Cognition and memory are normal. He expresses impulsivity. He exhibits a depressed mood.    Review of Systems  Neurological: Negative.   Psychiatric/Behavioral: Positive for depression.  All other systems reviewed and are negative.   Blood pressure (!) 112/59, pulse 73, temperature 97.8 F (36.6 C), temperature source Oral, resp. rate 18, height 6\' 2"  (1.88 m), weight 88.5 kg (195 lb), SpO2 98 %.Body mass index is 25.04 kg/m.  General Appearance: Casual  Eye Contact:  Good  Speech:  Clear and Coherent  Volume:  Normal  Mood:  Depressed  Affect:  Blunt  Thought Process:  Coherent, Goal Directed and Descriptions of Associations: Intact  Orientation:  Full (Time, Place, and Person)  Thought Content:  WDL  Suicidal Thoughts:  Yes.  without intent/plan  Homicidal Thoughts:  No  Memory:  Immediate;   Fair Recent;   Fair Remote;   Fair  Judgement:  Fair  Insight:  Lacking  Psychomotor Activity:  Normal  Concentration:  Concentration: Fair and Attention Span: Fair  Recall:  [employer] of Knowledge:  Fair  Language:  Fair  Akathisia:  No      Assets:  Communication Skills Desire for Improvement Financial Resources/Insurance Housing Resilience Social Support  ADL's:  Intact  Cognition:  WNL  Sleep:  Number of Hours: 7.75     Treatment Plan Summary: 46 yo male admitted due to worsening depression and SI. Pt reports no improvement in mood and  continues to endorse SI. He has had many past medication trials with no benefit. He has had many hospitalizations and has not had much benefit from hospitalizations.  Plan:  Mood disorder -Continue Latuda 60 mg daily -Continue Effexor XR daily. He is in the process of tapering off this medications -Continue Depakote 1000 mg qhs. We are tapering off this as he has not had any benefit from this and is not indicated for depression.  -Continue Lexapro 20 mg daily -[name] will start ECT next week.  Insomnia -Prazosin 2 mg qhs -Ambien 10 mg qhs  HTN -Carvedilol 6.25 mg BID  Dispo -Dr. Rondel Jumbo to discuss ECT with patient  Orson Slick, MD 03/25/2017, 12:12 PM

## 2017-03-25 NOTE — Plan of Care (Signed)
Continues to have a flat affect.  Verbalizes feelings and concerns more.  Attending groups.  More visible in the milieu and dayroom.  Interacting more with peers.  Medication compliant.  Support offered.  Safety rounds maintained.

## 2017-03-26 MED ORDER — VENLAFAXINE HCL ER 37.5 MG PO CP24
37.5000 mg | ORAL_CAPSULE | Freq: Every day | ORAL | Status: DC
  Administered 2017-03-27 – 2017-03-29 (×3): 37.5 mg via ORAL
  Filled 2017-03-26 (×3): qty 1

## 2017-03-26 MED ORDER — DIVALPROEX SODIUM 500 MG PO DR TAB
500.0000 mg | DELAYED_RELEASE_TABLET | Freq: Every day | ORAL | Status: DC
  Administered 2017-03-26: 500 mg via ORAL
  Filled 2017-03-26: qty 1

## 2017-03-26 MED ORDER — MIRTAZAPINE 15 MG PO TABS
15.0000 mg | ORAL_TABLET | Freq: Every day | ORAL | Status: DC
  Administered 2017-03-26 – 2017-04-01 (×7): 15 mg via ORAL
  Filled 2017-03-26 (×7): qty 1

## 2017-03-26 NOTE — Progress Notes (Signed)
[facility] MD Progress Note  03/26/2017 12:12 PM Michael Schmitt  MRN:  [id number] Subjective:  Pt states that he is looking forward to ECT on Friday and this has given him much more hope. SI are still present but less intrusive. He is still sleeping very poorly and really wants to be put back on Remeron for this. He still feels very depressed but more hopeful. He states that he will find out today if his sister will get a car so she can bring him to ECT appointments. He misses  His children a lot.   Principal Problem: Severe recurrent major depression without psychotic features ([place]) Diagnosis:   Patient Active Problem List   Diagnosis Date Noted  . Severe recurrent major depression without psychotic features ([place]) [F33.2] 03/12/2017    Priority: High  . Generalized anxiety disorder [F41.1] 02/05/2017  . Chronic neck pain (Primary Area of Pain) (Left) [M54.2, G89.29] 11/22/2016  . Cervical facet syndrome (Left) [N82.956] 11/22/2016  . Cervical radiculitis (Left) [M54.12] 11/22/2016  . History of cervical spinal surgery [Z98.890] 11/22/2016  . Cervical spondylosis [M47.22] 11/22/2016  . Chronic shoulder pain (Secondary Area of Pain) (Left) [M25.512, G89.29] 11/22/2016  . 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 Chronic low back pain [facility] Area of Pain) (Bilateral) (L>R) [M54.5, G89.29] 11/22/2016  . Lumbar facet syndrome (Bilateral) (L>R) [M47.816] 11/22/2016  . Chronic pain syndrome [G89.4] 11/21/2016  . Long term prescription benzodiazepine use [Z79.899] 11/21/2016  . Opiate use [F11.90] 11/21/2016  . Uncontrolled type 2 diabetes mellitus without complication, without long-term current use of insulin ([place]) [E11.65] 10/19/2016  . Chronic Suicidal ideation [R45.851] 09/15/2016  . Coronary artery disease [I25.10] 09/12/2016  . HTN (hypertension) [I10] 06/26/2016  . GERD (gastroesophageal reflux disease) [K21.9] 06/26/2016  . Alcohol use disorder, severe, in early remission  ([place]) [F10.21] 06/26/2016  . Moderate benzodiazepine use disorder ([place]) [F13.20] 06/11/2016  . Sedative, hypnotic or anxiolytic use disorder, severe, dependence ([place]) [F13.20] 05/12/2016  . Ventricular fibrillation ([place]) [I49.01] 04/05/2016  . 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 Cervical spondylosis without myelopathy [M47.812] 07/24/2014  . Severe episode of recurrent major depressive disorder, without psychotic features ([place]) [F33.2] 04/15/2013  . Tobacco use disorder [F17.200] 10/20/2010  . Asthma [J45.909] 10/20/2010   Total Time spent with patient: 20 minutes  Past Psychiatric History: See H&P  Past Medical History:  Past Medical History:  Diagnosis Date  . Anxiety   . Anxiety   . Arthritis    cerv. stenosis, spondylosis, HNP- lower back , has been followed in pain clinic, has  had injection s in cerv. area  . Blood dyscrasia    told that when he was young he was a" free bleeder"  . CAD (coronary artery disease)   . Cervical spondylosis without myelopathy 07/24/2014  . Cervicogenic headache 07/24/2014  . Chronic kidney disease    renal calculi- passed spontaneously  . Depression   . Diabetes mellitus without complication ([place])   . Fatty liver   . GERD (gastroesophageal reflux disease)   . Headache(784.0)   . Hyperlipidemia   . Hypertension   . Mental disorder   . MI, old   . RLS (restless legs syndrome)    detected on sleep study  . Shortness of breath   . Ventricular fibrillation ([place]) 04/05/2016    Past Surgical History:  Procedure Laterality Date  . ANTERIOR CERVICAL DECOMP/DISCECTOMY FUSION  11/13/2011   Procedure: ANTERIOR CERVICAL DECOMPRESSION/DISCECTOMY FUSION 1 LEVEL;  Surgeon: [name], MD;  Location: [place] NEURO ORS;  Service: Neurosurgery;  Laterality: N/A;  Anterior Cervical Decompression/discectomy Fusion. Cervical three-four.  Marland Kitchen CARDIAC CATHETERIZATION N/A 01/01/2015   Procedure: Left Heart Cath and Coronary Angiography;  Surgeon: [name],  MD;  Location: [place] CV LAB;  Service: Cardiovascular;  Laterality: N/A;  . CARDIAC CATHETERIZATION N/A 04/05/2016   Procedure: Left Heart Cath and Coronary Angiography;  Surgeon: Belva Crome, MD;  Location: [place] CV LAB;  Service: Cardiovascular;  Laterality: N/A;  . CARDIAC CATHETERIZATION N/A 04/05/2016   Procedure: Coronary Stent Intervention;  Surgeon: Belva Crome, MD;  Location: [place] CV LAB;  Service: Cardiovascular;  Laterality: N/A;  . CARDIAC CATHETERIZATION N/A 04/05/2016   Procedure: Intravascular Ultrasound/IVUS;  Surgeon: Belva Crome, MD;  Location: [place] CV LAB;  Service: Cardiovascular;  Laterality: N/A;  . NASAL SINUS SURGERY     2005   Family History:  Family History  Problem Relation Age of Onset  . Prostate cancer Father   . Hypertension Mother   . Kidney Stones Mother   . Anxiety disorder Mother   . Depression Mother   . COPD Sister   . Hypertension Sister   . Diabetes Sister   . Depression Sister   . Anxiety disorder Sister   . Seizures Sister   . ADD / ADHD Son   . ADD / ADHD Daughter    Family Psychiatric  History: See H&P Social History:  Social History   Substance and Sexual Activity  Alcohol Use No  . Alcohol/week: 0.0 oz   Comment: last drinked in 3 months.      Social History   Substance and Sexual Activity  Drug Use No    Social History   Socioeconomic History  . Marital status: Widowed    Spouse name: None  . Number of children: 3  . Years of education: 35  . Highest education level: None  Social Needs  . Financial resource strain: None  . Food insecurity - worry: None  . Food insecurity - inability: None  . Transportation needs - medical: None  . Transportation needs - non-medical: None  Occupational History    Comment: unemployed  Tobacco Use  . Smoking status: Current Every Day Smoker    Packs/day: 2.00    Years: 17.00    Pack years: 34.00    Types: Cigarettes, Cigars  . Smokeless tobacco:  Former Systems developer  . Tobacco comment: occassional snuff   Substance and Sexual Activity  . Alcohol use: No    Alcohol/week: 0.0 oz    Comment: last drinked in 3 months.   . Drug use: No  . Sexual activity: Not Currently  Other Topics Concern  . None  Social History Narrative   Patient is right handed.   Patient drinks 2 sodas daily.   Additional Social History:                         Sleep: Poor  Appetite:  Fair  Current Medications: Current Facility-Administered Medications  Medication Dose Route Frequency Provider Last Rate Last Dose  . acetaminophen (TYLENOL) tablet 650 mg  650 mg Oral Q6H PRN Clapacs, Madie Reno, MD   650 mg at 03/26/17 1205  . alum & mag hydroxide-simeth (MAALOX/MYLANTA) 200-200-20 MG/5ML suspension 30 mL  30 mL Oral Q4H PRN Clapacs, John T, MD      . aspirin EC tablet 81 mg  81 mg Oral Daily Clapacs, Madie Reno, MD  81 mg at 03/26/17 0815  . atorvastatin (LIPITOR) tablet 80 mg  80 mg Oral q1800 Clapacs, Madie Reno, MD   80 mg at 03/25/17 1705  . carvedilol (COREG) tablet 6.25 mg  6.25 mg Oral BID WC Clapacs, Madie Reno, MD   6.25 mg at 03/26/17 0815  . clopidogrel (PLAVIX) tablet 75 mg  75 mg Oral Daily Clapacs, Madie Reno, MD   75 mg at 03/26/17 0815  . divalproex (DEPAKOTE) DR tablet 1,000 mg  1,000 mg Oral QHS Delora Gravatt, Tyson Babinski, MD   1,000 mg at 03/25/17 2007  . escitalopram (LEXAPRO) tablet 20 mg  20 mg Oral Daily Ramond Dial, MD   20 mg at 03/26/17 9562  . hydrOXYzine (ATARAX/VISTARIL) tablet 50 mg  50 mg Oral TID PRN Marylin Crosby, MD   50 mg at 03/22/17 1135  . Lurasidone HCl TABS 60 mg  60 mg Oral Q breakfast Ramond Dial, MD   60 mg at 03/26/17 0815  . magnesium hydroxide (MILK OF MAGNESIA) suspension 30 mL  30 mL Oral Daily PRN Clapacs, John T, MD      . metFORMIN (GLUCOPHAGE) tablet 500 mg  500 mg Oral BID WC Clapacs, John T, MD   500 mg at 03/26/17 1308  . multivitamin with minerals tablet 1 tablet  1 tablet Oral Daily Jolie Strohecker, Tyson Babinski, MD   1 tablet  at 03/26/17 0815  . nicotine (NICODERM CQ - dosed in mg/24 hours) patch 21 mg  21 mg Transdermal Daily Jafar Poffenberger, Tyson Babinski, MD   21 mg at 03/26/17 0815  . pneumococcal 23 valent vaccine (PNU-IMMUNE) injection 0.5 mL  0.5 mL Intramuscular Tomorrow-1000 Rena Sweeden R, MD      . prazosin (MINIPRESS) capsule 2 mg  2 mg Oral QHS Clapacs, Madie Reno, MD   2 mg at 03/25/17 2008  . thiamine (VITAMIN B-1) tablet 100 mg  100 mg Oral Daily Antigone Crowell, Tyson Babinski, MD   100 mg at 03/26/17 0815  . traZODone (DESYREL) tablet 100 mg  100 mg Oral QHS PRN Ramond Dial, MD   100 mg at 03/25/17 2009  . venlafaxine XR (EFFEXOR-XR) 24 hr capsule 75 mg  75 mg Oral Q breakfast Ramond Dial, MD   75 mg at 03/26/17 0815  . zolpidem (AMBIEN) tablet 10 mg  10 mg Oral QHS Ramond Dial, MD   10 mg at 03/25/17 2007    Lab Results: No results found for this or any previous visit (from the past 48 hour(s)).  Blood Alcohol level:  Lab Results  Component Value Date   ETH 37 (H) 03/12/2017   ETH <10 65/78/4696    Metabolic Disorder Labs: Lab Results  Component Value Date   HGBA1C 6.0 (H) 03/13/2017   MPG 126 03/13/2017   MPG 177 09/12/2016   Lab Results  Component Value Date   PROLACTIN 18.1 (H) 09/12/2016   PROLACTIN 6.5 05/27/2016   Lab Results  Component Value Date   CHOL 118 03/13/2017   TRIG 325 (H) 03/13/2017   HDL 31 (L) 03/13/2017   CHOLHDL 3.8 03/13/2017   VLDL 65 (H) 03/13/2017   LDLCALC 22 03/13/2017   LDLCALC 37 09/12/2016    Physical Findings: AIMS: Facial and Oral Movements Muscles of Facial Expression: None, normal Lips and Perioral Area: None, normal Jaw: None, normal Tongue: None, normal,Extremity Movements Upper (arms, wrists, hands, fingers): None, normal Lower (legs, knees, ankles, toes): None, normal, Trunk Movements Neck, shoulders, hips: None, normal, Overall Severity Severity of abnormal  movements (highest score from questions above): None, normal Incapacitation due to  abnormal movements: None, normal Patient's awareness of abnormal movements (rate only patient's report): No Awareness, Dental Status Current problems with teeth and/or dentures?: No Does patient usually wear dentures?: No  CIWA:  CIWA-Ar Total: 0 COWS:  COWS Total Score: 2  Musculoskeletal: Strength & Muscle Tone: within normal limits Gait & Station: normal Patient leans: N/A  Psychiatric Specialty Exam: Physical Exam  ROS  Blood pressure (!) 147/74, pulse 87, temperature 98.5 F (36.9 C), temperature source Oral, resp. rate 18, height 6\' 2"  (1.88 m), weight 88.5 kg (195 lb), SpO2 98 %.Body mass index is 25.04 kg/m.  General Appearance: Casual  Eye Contact:  Fair  Speech:  Clear and Coherent  Volume:  Normal  Mood:  Depressed  Affect:  Congruent  Thought Process:  Coherent and Goal Directed  Orientation:  Full (Time, Place, and Person)  Thought Content:  Negative  Suicidal Thoughts:  Yes.  without intent/plan  Homicidal Thoughts:  No  Memory:  Immediate;   Fair  Judgement:  Fair  Insight:  Fair  Psychomotor Activity:  Normal  Concentration:  Concentration: Fair  Recall:  [employer] of Knowledge:  Fair  Language:  Fair  Akathisia:  No      Assets:  Communication Skills Resilience  ADL's:  Intact  Cognition:  WNL  Sleep:  Number of Hours: 7.75     Treatment Plan Summary: 46 yo male admitted due to [place]. Pt continues to endorse depression and chronic SI but feels more hopeful since he will be starting ECT on Friday.  Plan:  Mood disorder -Continue Latuda 60 mg daily -Decrease Effexor to 37.5 mg daily. He is in the process of tapering off this medication -Decrease Depakote to 500 mg qhs. We are tapering off this medication -Continue Lexapro 20 mg daily -Pt will start ECT on Friday-he will find out if sister can bring to appointments as an outpatient -Restart Remeron 15 mg qhs per his request  Insomnia -Prazosin -Ambien -Remeron 15 mg qhs  HTN -Carvedilol    Dispo -Pt will start ECT on Friday  Xzander Gilham R Emersynn Deatley, MD 03/26/2017, 12:12 PM

## 2017-03-26 NOTE — Plan of Care (Signed)
Denies SI/HI/AVH.  Continues to endorse depression.  Affect constricted.  Thought processes logical and coherent.  Paces halls at times.  Verbalizes feelings more freely.  Support and encouragement offered.  Safety rounds maintained.

## 2017-03-26 NOTE — Progress Notes (Signed)
Recreation Therapy Notes  Date: 12.31.2018   Time: 9:30 am   Location: Craft Room   Behavioral response: Appropriate   Intervention Topic: Coping skills   Discussion/Intervention: Group content on today was focused on coping skills. The group defined what coping skills are and when they can be used. Individuals described how they normally cope with thing and the coping skills they normally use. Patients expressed why it is important to cope with things and how not coping with things can affect you. The group participated in the intervention "My coping box" and made coping boxes while adding coping skills they could use in the future to the box. Clinical Observations/Feedback:  Patient came to group and was focused on what his peers and staff had to say about the topic at hand. He participated in group and was appropriate.   Fumiko Cham LRT/CTRS         Julizza Sassone 03/26/2017 1:01 PM

## 2017-03-26 NOTE — Progress Notes (Signed)
Recreation Therapy Notes  Date: 12.31 .2018  Time: 3:00pm  Location: Craft room  Behavioral response: Appropriate  Group Type: Craft  Participation level: Active  Communication: Patient was social with peers and staff.  Comments: N/A  Dalphine Cowie LRT/CTRS        Reggie Welge 03/26/2017 4:03 PM

## 2017-03-26 NOTE — Progress Notes (Signed)
D:Pt denies SI/HI/AVH. Pt is pleasant and cooperative. Pt verbally contracts for safety.    A: Q x 15 minute observation checks were completed for safety. Patient was provided with education. Patient was given scheduled medications. Patient  was encourage to attend groups, participate in unit activities and continue with plan of care.   R:Patient is complaint with medication and unit procedures. Attends groups.             Patient slept for Estimated Hours of 8; Precautionary checks every 15 minutes for safety maintained, room free of safety hazards, patient sustains no injury or falls during this shift.

## 2017-03-26 NOTE — Plan of Care (Signed)
Patient denies SI and contracts for safety. Patient's safety is maintained on unit. Patient has no report of anxiety at this time.    Progressing Safety: Ability to disclose and discuss suicidal ideas will improve 03/26/2017 2016 - Progressing by Anson [place], RN Self-Concept: Level of anxiety will decrease 03/26/2017 2016 - Progressing by Alyson Locket I, RN Safety: Ability to remain free from injury will improve 03/26/2017 2016 - Progressing by Anson [place], RN

## 2017-03-26 NOTE — [facility] Group Notes (Signed)
03/26/2017  Time: 1:00PM   Type of Therapy and Topic:  Group Therapy:  Overcoming Obstacles   Participation Level:  Minimal   Description of Group:   In this group patients will be encouraged to explore what they see as obstacles to their own wellness and recovery. They will be guided to discuss their thoughts, feelings, and behaviors related to these obstacles. The group will process together ways to cope with barriers, with attention given to specific choices patients can make. Each patient will be challenged to identify changes they are motivated to make in order to overcome their obstacles. This group will be process-oriented, with patients participating in exploration of their own experiences, giving and receiving support, and processing challenge from other group members.   Therapeutic Goals: 1. Patient will identify personal and current obstacles as they relate to admission. 2. Patient will identify barriers that currently interfere with their wellness or overcoming obstacles.  3. Patient will identify feelings, thought process and behaviors related to these barriers. 4. Patient will identify two changes they are willing to make to overcome these obstacles:      Summary of Patient Progress  Pt continues to work towards their tx goals but has not yet reached them. Pt was able to appropriately participate in group discussion, and was able to offer support/validation to other group members. Pt reported his long term obstacle is his depression. Pt reported one step he can take to overcome this obstacle is "trying ECT."    Therapeutic Modalities:   Cognitive Behavioral Therapy Solution Focused Therapy Motivational Interviewing Relapse Prevention Therapy  Alden Hipp, MSW, LCSW 03/26/2017 2:20 PM

## 2017-03-26 NOTE — Plan of Care (Signed)
Pt. Verbally contracts for safety. Pt denies SI/HI and denies AVH. Pt participates in unit activities and groups. Pt. States he is able to remain safe while on the unit.

## 2017-03-26 NOTE — [facility] Group Notes (Signed)
[facility] Group Notes:  (Nursing/MHT/Case Management/Adjunct)  Date:  03/26/2017  Time:  10:11 PM  Type of Therapy:  Group Therapy  Participation Level:  Active  Participation Quality:  Appropriate  Affect:  Appropriate  Cognitive:  Appropriate  Insight:  Appropriate  Engagement in Group:  Engaged  Modes of Intervention:  Discussion  Summary of Progress/Problems:  Kandis Fantasia 03/26/2017, 10:11 PM

## 2017-03-27 NOTE — Progress Notes (Signed)
D: Patient denies SI/HI/AVH. Patient is flat and depressed. Patient interaction is brief. Patient is seen in milieu interacting with peers.   A: Patient was assessed by this nurse. Patient received scheduled medications. Q x 15 minute observation checks were completed for safety. Patient was provided with verbal education on provided medications. Patient care plan was reviewed. Patient was offered support and encouragement. Patient was encourage to attend groups, participate in unit activities and continue with plan of care.   R: Patient adheres with scheduled medication. Patient has no complaints of pain at this time. Patient is receptive to treatment and safety maintained on unit.

## 2017-03-27 NOTE — [facility] Group Notes (Signed)
LCSW Group Therapy Note 03/27/2017 9:00am  Type of Therapy and Topic:  Group Therapy:  Setting Goals  Participation Level:  Did Not Attend  Description of Group: In this process group, patients discussed using strengths to work toward goals and address challenges.  Patients identified two positive things about themselves and one goal they were working on.  Patients were given the opportunity to share openly and support each other's plan for self-empowerment.  The group discussed the value of gratitude and were encouraged to have a daily reflection of positive characteristics or circumstances.  Patients were encouraged to identify a plan to utilize their strengths to work on current challenges and goals.  Therapeutic Goals 1. Patient will verbalize personal strengths/positive qualities and relate how these can assist with achieving desired personal goals 2. Patients will verbalize affirmation of peers plans for personal change and goal setting 3. Patients will explore the value of gratitude and positive focus as related to successful achievement of goals 4. Patients will verbalize a plan for regular reinforcement of personal positive qualities and circumstances.  Summary of Patient Progress:       Therapeutic Modalities Cognitive Behavioral Therapy Motivational Interviewing    Michael Saucer, LCSW 03/27/2017 10:48 AM

## 2017-03-27 NOTE — Progress Notes (Signed)
[facility] MD Progress Note  03/27/2017 12:35 PM Michael Schmitt  MRN:  [id number]   Subjective:  Pt is in his room this afternoon reading the bible. HE states that he has always had a strong faith and his grandfather was a Theme park manager. It helps him to read the bible. HE states that he is feeling more hopeful now that he is going to get ECT. He processed that depression has been really hard for him because his kids are "my everything." HE states that he loves them so much however he himself does not feel well so it has been really hard on him. He states that SI is improving now that he is looking forward to ECT. He wants to feel better so he can be there for his kids. He states that he feels "out of it." Discussed that this could be due to the taper off Effexor and can sometimes cause this. He did sleep better last night since restarted Remeron. Affect does appear somewhat brighter today and much less negative thinking. He states that his sisters phone got shut off and no one can get ahold of her until Thursday. He states that she was unable to buy a car because of the holiday and not having the money yet. This means that he will not have transportation to get to ECT until she is able to get a car.   Principal Problem: Severe recurrent major depression without psychotic features ([place]) Diagnosis:   Patient Active Problem List   Diagnosis Date Noted  . Severe recurrent major depression without psychotic features ([place]) [F33.2] 03/12/2017    Priority: High  . Generalized anxiety disorder [F41.1] 02/05/2017  . Chronic neck pain (Primary Area of Pain) (Left) [M54.2, G89.29] 11/22/2016  . Cervical facet syndrome (Left) [O27.741] 11/22/2016  . Cervical radiculitis (Left) [M54.12] 11/22/2016  . History of cervical spinal surgery [Z98.890] 11/22/2016  . Cervical spondylosis [M47.22] 11/22/2016  . Chronic shoulder pain (Secondary Area of Pain) (Left) [M25.512, G89.29] 11/22/2016  . Arthropathy of shoulder (Left)  [M19.012] 11/22/2016  . Chronic low back pain [facility] Area of Pain) (Bilateral) (L>R) [M54.5, G89.29] 11/22/2016  . Lumbar facet syndrome (Bilateral) (L>R) [M47.816] 11/22/2016  . Chronic pain syndrome [G89.4] 11/21/2016  . Long term prescription benzodiazepine use [Z79.899] 11/21/2016  . Opiate use [F11.90] 11/21/2016  . Uncontrolled type 2 diabetes mellitus without complication, without long-term current use of insulin ([place]) [E11.65] 10/19/2016  . Chronic Suicidal ideation [R45.851] 09/15/2016  . Coronary artery disease [I25.10] 09/12/2016  . HTN (hypertension) [I10] 06/26/2016  . GERD (gastroesophageal reflux disease) [K21.9] 06/26/2016  . Alcohol use disorder, severe, 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[place]) [F10.21] 06/26/2016  . Moderate benzodiazepine use disorder ([place]) [F13.20] 06/11/2016  . Sedative, hypnotic or anxiolytic use disorder, severe, dependence ([place]) [F13.20] 05/12/2016  . Ventricular fibrillation ([place]) [I49.01] 04/05/2016  . 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 Cervical spondylosis without myelopathy [M47.812] 07/24/2014  . Severe episode of recurrent major depressive disorder, without psychotic features ([place]) [F33.2] 04/15/2013  . Tobacco use disorder [F17.200] 10/20/2010  . Asthma [J45.909] 10/20/2010   Total Time spent with patient: 20 minutes  Past Psychiatric History: See H&P  Past Medical History:  Past Medical History:  Diagnosis Date  . Anxiety   . Anxiety   . Arthritis    cerv. stenosis, spondylosis, HNP- lower back , has been followed in pain clinic, has  had injection s in cerv. area  . Blood dyscrasia    told that when he was young he was  a" free bleeder"  . CAD (coronary artery disease)   . Cervical spondylosis without myelopathy 07/24/2014  . Cervicogenic headache 07/24/2014  . Chronic kidney disease    renal calculi- passed spontaneously  . Depression   . Diabetes mellitus without complication ([place])   . Fatty liver   . GERD (gastroesophageal  reflux disease)   . Headache(784.0)   . Hyperlipidemia   . Hypertension   . Mental disorder   . MI, old   . RLS (restless legs syndrome)    detected on sleep study  . Shortness of breath   . Ventricular fibrillation ([place]) 04/05/2016    Past Surgical History:  Procedure Laterality Date  . ANTERIOR CERVICAL DECOMP/DISCECTOMY FUSION  11/13/2011   Procedure: ANTERIOR CERVICAL DECOMPRESSION/DISCECTOMY FUSION 1 LEVEL;  Surgeon: [name], MD;  Location: [place] [facility];  Service: Neurosurgery;  Laterality: N/A;  Anterior Cervical Decompression/discectomy Fusion. Cervical three-four.  Marland Kitchen CARDIAC CATHETERIZATION N/A 01/01/2015   Procedure: Left Heart Cath and Coronary Angiography;  Surgeon: [name], MD;  Location: [place] [facility];  Service: Cardiovascular;  Laterality: N/A;  . CARDIAC CATHETERIZATION N/A 04/05/2016   Procedure: Left Heart Cath and Coronary Angiography;  Surgeon: Belva Crome, MD;  Location: [place] [facility];  Service: Cardiovascular;  Laterality: N/A;  . CARDIAC CATHETERIZATION N/A 04/05/2016   Procedure: Coronary Stent Intervention;  Surgeon: Belva Crome, MD;  Location: [place] [facility];  Service: Cardiovascular;  Laterality: N/A;  . CARDIAC CATHETERIZATION N/A 04/05/2016   Procedure: Intravascular Ultrasound/IVUS;  Surgeon: Belva Crome, MD;  Location: [place] [facility];  Service: Cardiovascular;  Laterality: N/A;  . NASAL SINUS SURGERY     2005   Family History:  Family History  Problem Relation Age of Onset  . Prostate cancer Father   . Hypertension Mother   . Kidney Stones Mother   . Anxiety disorder Mother   . Depression Mother   . COPD Sister   . Hypertension Sister   . Diabetes Sister   . Depression Sister   . Anxiety disorder Sister   . Seizures Sister   . ADD / ADHD Son   . ADD / ADHD Daughter    Family Psychiatric  History: See H&P Social History:  Social History   Substance and Sexual Activity  Alcohol Use No  . Alcohol/week: 0.0 oz    Comment: last drinked in 3 months.      Social History   Substance and Sexual Activity  Drug Use No    Social History   Socioeconomic History  . Marital status: Widowed    Spouse name: None  . Number of children: 3  . Years of education: 33  . Highest education level: None  Social Needs  . Financial resource strain: None  . Food insecurity - worry: None  . Food insecurity - inability: None  . Transportation needs - medical: None  . Transportation needs - non-medical: None  Occupational History    Comment: unemployed  Tobacco Use  . Smoking status: Current Every Day Smoker    Packs/day: 2.00    Years: 17.00    Pack years: 34.00    Types: Cigarettes, Cigars  . Smokeless tobacco: Former Systems developer  . Tobacco comment: occassional snuff   Substance and Sexual Activity  . Alcohol use: No    Alcohol/week: 0.0 oz    Comment: last drinked in 3 months.   . Drug use: No  . Sexual activity: Not Currently  Other Topics  Concern  . None  Social History Narrative   Patient is right handed.   Patient drinks 2 sodas daily.   Additional Social History:                         Sleep: Improving  Appetite:  Good  Current Medications: Current Facility-Administered Medications  Medication Dose Route Frequency Provider Last Rate Last Dose  . acetaminophen (TYLENOL) tablet 650 mg  650 mg Oral Q6H PRN Clapacs, Madie Reno, MD   650 mg at 03/27/17 1131  . alum & mag hydroxide-simeth (MAALOX/MYLANTA) 200-200-20 MG/5ML suspension 30 mL  30 mL Oral Q4H PRN Clapacs, John T, MD      . aspirin EC tablet 81 mg  81 mg Oral Daily Clapacs, Madie Reno, MD   81 mg at 03/27/17 0846  . atorvastatin (LIPITOR) tablet 80 mg  80 mg Oral q1800 Clapacs, Madie Reno, MD   80 mg at 03/26/17 1724  . carvedilol (COREG) tablet 6.25 mg  6.25 mg Oral BID WC Clapacs, Madie Reno, MD   6.25 mg at 03/27/17 0846  . clopidogrel (PLAVIX) tablet 75 mg  75 mg Oral Daily Clapacs, Madie Reno, MD   75 mg at 03/27/17 0845  . escitalopram  (LEXAPRO) tablet 20 mg  20 mg Oral Daily Ramond Dial, MD   20 mg at 03/27/17 0845  . hydrOXYzine (ATARAX/VISTARIL) tablet 50 mg  50 mg Oral TID PRN Marylin Crosby, MD   50 mg at 03/22/17 1135  . Lurasidone HCl TABS 60 mg  60 mg Oral Q breakfast Ramond Dial, MD   60 mg at 03/27/17 0847  . magnesium hydroxide (MILK OF MAGNESIA) suspension 30 mL  30 mL Oral Daily PRN Clapacs, John T, MD      . metFORMIN (GLUCOPHAGE) tablet 500 mg  500 mg Oral BID WC Clapacs, Madie Reno, MD   500 mg at 03/27/17 0846  . mirtazapine (REMERON) tablet 15 mg  15 mg Oral QHS Marylin Crosby, MD   15 mg at 03/26/17 2129  . multivitamin with minerals tablet 1 tablet  1 tablet Oral Daily McNew, Tyson Babinski, MD   1 tablet at 03/27/17 0845  . nicotine (NICODERM CQ - dosed in mg/24 hours) patch 21 mg  21 mg Transdermal Daily McNew, Tyson Babinski, MD   21 mg at 03/27/17 0848  . pneumococcal 23 valent vaccine (PNU-IMMUNE) injection 0.5 mL  0.5 mL Intramuscular Tomorrow-1000 McNew, Holly R, MD      . prazosin (MINIPRESS) capsule 2 mg  2 mg Oral QHS Clapacs, Madie Reno, MD   2 mg at 03/26/17 2129  . thiamine (VITAMIN B-1) tablet 100 mg  100 mg Oral Daily McNew, Tyson Babinski, MD   100 mg at 03/27/17 0845  . traZODone (DESYREL) tablet 100 mg  100 mg Oral QHS PRN Ramond Dial, MD   100 mg at 03/25/17 2009  . venlafaxine XR (EFFEXOR-XR) 24 hr capsule 37.5 mg  37.5 mg Oral Q breakfast McNew, Tyson Babinski, MD   37.5 mg at 03/27/17 0845  . zolpidem (AMBIEN) tablet 10 mg  10 mg Oral QHS Ramond Dial, MD   10 mg at 03/26/17 2129    Lab Results: No results found for this or any previous visit (from the past 48 hour(s)).  Blood Alcohol level:  Lab Results  Component Value Date   ETH 37 (H) 03/12/2017   ETH <10 48/54/6270    Metabolic Disorder Labs: Lab Results  Component Value Date   HGBA1C 6.0 (H) 03/13/2017   MPG 126 03/13/2017   MPG 177 09/12/2016   Lab Results  Component Value Date   PROLACTIN 18.1 (H) 09/12/2016   PROLACTIN  6.5 05/27/2016   Lab Results  Component Value Date   CHOL 118 03/13/2017   TRIG 325 (H) 03/13/2017   HDL 31 (L) 03/13/2017   CHOLHDL 3.8 03/13/2017   VLDL 65 (H) 03/13/2017   LDLCALC 22 03/13/2017   LDLCALC 37 09/12/2016    Physical Findings: AIMS: Facial and Oral Movements Muscles of Facial Expression: None, normal Lips and Perioral Area: None, normal Jaw: None, normal Tongue: None, normal,Extremity Movements Upper (arms, wrists, hands, fingers): None, normal Lower (legs, knees, ankles, toes): None, normal, Trunk Movements Neck, shoulders, hips: None, normal, Overall Severity Severity of abnormal movements (highest score from questions above): None, normal Incapacitation due to abnormal movements: None, normal Patient's awareness of abnormal movements (rate only patient's report): No Awareness, Dental Status Current problems with teeth and/or dentures?: No Does patient usually wear dentures?: No  CIWA:  CIWA-Ar Total: 0 COWS:  COWS Total Score: 2  Musculoskeletal: Strength & Muscle Tone: within normal limits Gait & Station: normal Patient leans: N/A  Psychiatric Specialty Exam: Physical Exam  ROS  Blood pressure 126/76, pulse 95, temperature (!) 97.5 F (36.4 C), temperature source Oral, resp. rate 18, height 6\' 2"  (1.88 m), weight 88.5 kg (195 lb), SpO2 98 %.Body mass index is 25.04 kg/m.  General Appearance: Casual  Eye Contact:  Good  Speech:  Clear and Coherent  Volume:  Normal  Mood:  Depressed  Affect:  Congruent  Thought Process:  Coherent and Goal Directed  Orientation:  Full (Time, Place, and Person)  Thought Content:  Negative  Suicidal Thoughts:  Yes.  without intent/plan  Homicidal Thoughts:  No  Memory:  Immediate;   Fair  Judgement:  Fair  Insight:  Fair  Psychomotor Activity:  Normal  Concentration:  Concentration: Fair  Recall:  [employer] of Knowledge:  Fair  Language:  Fair  Akathisia:  No      Assets:  Communication Skills Desire for  Improvement Housing Resilience  ADL's:  Intact  Cognition:  WNL  Sleep:  Number of Hours: 6.5     Treatment Plan Summary: 47 yo male admitted due to [place] [facility]. He has been on many medications with no improvement. He is set to start ECT on Friday with Dr. Rondel Jumbo. He would be able to complete this as an outpatient however transportation is a barrier and this has been a barrier in the past for him. His sister may get a car soon and can bring him for treatments but this has not happened yet. He will likely have to start treatments while in the hospital until his sister can give transportation and transition to outpatient.   Plan:  Mood disorder -Continue Latuda 60 mg daily -Continue Effexor 37. 5 mg daily. He is in the process of tapering off this medication -Stop Depakote -Continue LExapro 20 mg daily -Pt will start ECT on Friday -Continue Remeron 15 mg qhs  Insomnia _improving with Remeron -Continue Ambien and prazosin  HTN -Carvedilol  Dispo -He will start ECT on Friday   Marylin Crosby, MD 03/27/2017, 12:35 PM

## 2017-03-27 NOTE — Progress Notes (Signed)
D- Patient alert and oriented. Patient presents in a depressed mood on assessment stating that his depression/anxiety levels are a "10/10" stating that he doesn't know what's causing it. Patient denies SI, HI, AVH, and pain at this time.  A- Scheduled medications administered to patient, per MD orders. Support and encouragement provided.  Routine safety checks conducted every 15 minutes.  Patient informed to notify staff with problems or concerns.  R- No adverse drug reactions noted. Patient contracts for safety at this time. Patient compliant with medications and treatment plan. Patient receptive, calm, and cooperative. Patient interacts well with others on the unit.  Patient remains safe at this time.

## 2017-03-27 NOTE — Progress Notes (Signed)
Recreation Therapy Notes  Date: 01.01.2019   Time: 9:30 am   Location: Craft Room   Behavioral response: N/A   Intervention Topic: Goals   Discussion/Intervention: Patient did not attend group. Clinical Observations/Feedback:  Patient did not attend group. Ayslin Kundert LRT/CTRS         Taylen Wendland 03/27/2017 11:02 AM

## 2017-03-27 NOTE — [facility] Group Notes (Signed)
03/27/2017 1PM  Type of Therapy/Topic:  Group Therapy:  Feelings about Diagnosis  Participation Level:  Active   Description of Group:   This group will allow patients to explore their thoughts and feelings about diagnoses they have received. Patients will be guided to explore their level of understanding and acceptance of these diagnoses. Facilitator will encourage patients to process their thoughts and feelings about the reactions of others to their diagnosis and will guide patients in identifying ways to discuss their diagnosis with significant others in their lives. This group will be process-oriented, with patients participating in exploration of their own experiences, giving and receiving support, and processing challenge from other group members.   Therapeutic Goals: 1. Patient will demonstrate understanding of diagnosis as evidenced by identifying two or more symptoms of the disorder 2. Patient will be able to express two feelings regarding the diagnosis 3. Patient will demonstrate their ability to communicate their needs through discussion and/or role play  Summary of Patient Progress: Pt continues to work towards their tx goals but has not yet reached them. Pt was able to appropriately participate in group discussion, and was able to offer support/validation to other group members. Pt reported two feelings around his diagnosis he feels are, "nervous but also a little scared." Pt reported he doesn't necessarily agree with his diagnosis, but expressed understanding of the idea of there being varying degrees of symptoms.     Therapeutic Modalities:   Cognitive Behavioral Therapy Brief Therapy  Alden Hipp, MSW, LCSW 03/27/2017 2:12 PM

## 2017-03-27 NOTE — Plan of Care (Signed)
Patient states that he slept good last night and that he is feeling "ok" at this time. Patient verbalizes understanding of his condition and discharge needs. Patient has the ability to identify available resources to assist him in meeting his health care needs as well as any necessary lifestyle changes to reduce the recurrence of his condition. Patient denies SI/HI/AVH at this time. Patient has been observed reading and walking around the unit as a way to cope/improve his thought process. Patient verbalizes understanding of his therapeutic regimen thus far without any further questions or concerns at this time. Patient has the ability to identify/utilize positive support systems to promote safety and his overall well-being. Patient has been observed participating in unit groups. Patient reports that his depression/anxiety level is "10/10" and he doesn't know why and when this writer asked patient if reading helps, patient states "sometimes".

## 2017-03-28 MED ORDER — IBUPROFEN 600 MG PO TABS
600.0000 mg | ORAL_TABLET | Freq: Four times a day (QID) | ORAL | Status: DC | PRN
  Administered 2017-03-28 – 2017-04-02 (×8): 600 mg via ORAL
  Filled 2017-03-28 (×11): qty 1

## 2017-03-28 NOTE — Progress Notes (Signed)
[facility] MD Progress Note  03/28/2017 2:16 PM Michael Schmitt  MRN:  [id number]   Subjective:  Pt states that mood is improving and is very much looking forward to ECT. Suicidal thoughts are improving. He states that he misses his children a lot and wants to go  Home soon. He is sleeping better. Affect appears brighter. He states that his sister will have her phone turned on again tomorrow and will likely be getting a car tomorrow so she can bring him in for ECT.   Principal Problem: Severe recurrent major depression without psychotic features ([place]) Diagnosis:   Patient Active Problem List   Diagnosis Date Noted  . Severe recurrent major depression without psychotic features ([place]) [F33.2] 03/12/2017    Priority: High  . Generalized anxiety disorder [F41.1] 02/05/2017  . Chronic neck pain (Primary Area of Pain) (Left) [M54.2, G89.29] 11/22/2016  . Cervical facet syndrome (Left) [D32.202] 11/22/2016  . Cervical radiculitis (Left) [M54.12] 11/22/2016  . History of cervical spinal surgery [Z98.890] 11/22/2016  . Cervical spondylosis [M47.22] 11/22/2016  . Chronic shoulder pain (Secondary Area of Pain) (Left) [M25.512, G89.29] 11/22/2016  . 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 Chronic low back pain [facility] Area of Pain) (Bilateral) (L>R) [M54.5, G89.29] 11/22/2016  . Lumbar facet syndrome (Bilateral) (L>R) [M47.816] 11/22/2016  . Chronic pain syndrome [G89.4] 11/21/2016  . Long term prescription benzodiazepine use [Z79.899] 11/21/2016  . Opiate use [F11.90] 11/21/2016  . Uncontrolled type 2 diabetes mellitus without complication, without long-term current use of insulin ([place]) [E11.65] 10/19/2016  . Chronic Suicidal ideation [R45.851] 09/15/2016  . Coronary artery disease [I25.10] 09/12/2016  . HTN (hypertension) [I10] 06/26/2016  . GERD (gastroesophageal reflux disease) [K21.9] 06/26/2016  . Alcohol use disorder, severe, 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[place]) [F10.21] 06/26/2016  .  Moderate benzodiazepine use disorder ([place]) [F13.20] 06/11/2016  . Sedative, hypnotic or anxiolytic use disorder, severe, dependence ([place]) [F13.20] 05/12/2016  . Ventricular fibrillation ([place]) [I49.01] 04/05/2016  . 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 Cervical spondylosis without myelopathy [M47.812] 07/24/2014  . Severe episode of recurrent major depressive disorder, without psychotic features ([place]) [F33.2] 04/15/2013  . Tobacco use disorder [F17.200] 10/20/2010  . Asthma [J45.909] 10/20/2010   Total Time spent with patient: 15 minutes  Past Psychiatric History: See H&P  Past Medical History:  Past Medical History:  Diagnosis Date  . Anxiety   . Anxiety   . Arthritis    cerv. stenosis, spondylosis, HNP- lower back , has been followed in pain clinic, has  had injection s in cerv. area  . Blood dyscrasia    told that when he was young he was a" free bleeder"  . CAD (coronary artery disease)   . Cervical spondylosis without myelopathy 07/24/2014  . Cervicogenic headache 07/24/2014  . Chronic kidney disease    renal calculi- passed spontaneously  . Depression   . Diabetes mellitus without complication ([place])   . Fatty liver   . GERD (gastroesophageal reflux disease)   . Headache(784.0)   . Hyperlipidemia   . Hypertension   . Mental disorder   . MI, old   . RLS (restless legs syndrome)    detected on sleep study  . Shortness of breath   . Ventricular fibrillation ([place]) 04/05/2016    Past Surgical History:  Procedure Laterality Date  . ANTERIOR CERVICAL DECOMP/DISCECTOMY FUSION  11/13/2011   Procedure: ANTERIOR CERVICAL DECOMPRESSION/DISCECTOMY FUSION 1 LEVEL;  Surgeon: [name], MD;  Location: [facility];  Service: Neurosurgery;  Laterality:  N/A;  Anterior Cervical Decompression/discectomy Fusion. Cervical three-four.  Marland Kitchen CARDIAC CATHETERIZATION N/A 01/01/2015   Procedure: Left Heart Cath and Coronary Angiography;  Surgeon: [name], MD;  Location: [facility] CV  LAB;  Service: Cardiovascular;  Laterality: N/A;  . CARDIAC CATHETERIZATION N/A 04/05/2016   Procedure: Left Heart Cath and Coronary Angiography;  Surgeon: Belva Crome, MD;  Location: [place] CV LAB;  Service: Cardiovascular;  Laterality: N/A;  . CARDIAC CATHETERIZATION N/A 04/05/2016   Procedure: Coronary Stent Intervention;  Surgeon: Belva Crome, MD;  Location: [place] CV LAB;  Service: Cardiovascular;  Laterality: N/A;  . CARDIAC CATHETERIZATION N/A 04/05/2016   Procedure: Intravascular Ultrasound/IVUS;  Surgeon: Belva Crome, MD;  Location: [place] CV LAB;  Service: Cardiovascular;  Laterality: N/A;  . NASAL SINUS SURGERY     2005   Family History:  Family History  Problem Relation Age of Onset  . Prostate cancer Father   . Hypertension Mother   . Kidney Stones Mother   . Anxiety disorder Mother   . Depression Mother   . COPD Sister   . Hypertension Sister   . Diabetes Sister   . Depression Sister   . Anxiety disorder Sister   . Seizures Sister   . ADD / ADHD Son   . ADD / ADHD Daughter    Family Psychiatric  History: See H&P Social History:  Social History   Substance and Sexual Activity  Alcohol Use No  . Alcohol/week: 0.0 oz   Comment: last drinked in 3 months.      Social History   Substance and Sexual Activity  Drug Use No    Social History   Socioeconomic History  . Marital status: Widowed    Spouse name: None  . Number of children: 3  . Years of education: 58  . Highest education level: None  Social Needs  . Financial resource strain: None  . Food insecurity - worry: None  . Food insecurity - inability: None  . Transportation needs - medical: None  . Transportation needs - non-medical: None  Occupational History    Comment: unemployed  Tobacco Use  . Smoking status: Current Every Day Smoker    Packs/day: 2.00    Years: 17.00    Pack years: 34.00    Types: Cigarettes, Cigars  . Smokeless tobacco: Former Systems developer  . Tobacco comment:  occassional snuff   Substance and Sexual Activity  . Alcohol use: No    Alcohol/week: 0.0 oz    Comment: last drinked in 3 months.   . Drug use: No  . Sexual activity: Not Currently  Other Topics Concern  . None  Social History Narrative   Patient is right handed.   Patient drinks 2 sodas daily.   Additional Social History:                         Sleep: Fair  Appetite:  Good  Current Medications: Current Facility-Administered Medications  Medication Dose Route Frequency Provider Last Rate Last Dose  . acetaminophen (TYLENOL) tablet 650 mg  650 mg Oral Q6H PRN Clapacs, Madie Reno, MD   650 mg at 03/28/17 1240  . alum & mag hydroxide-simeth (MAALOX/MYLANTA) 200-200-20 MG/5ML suspension 30 mL  30 mL Oral Q4H PRN Clapacs, John T, MD      . aspirin EC tablet 81 mg  81 mg Oral Daily Clapacs, John T, MD   81 mg at 03/28/17 0900  . atorvastatin (  LIPITOR) tablet 80 mg  80 mg Oral q1800 Clapacs, Madie Reno, MD   80 mg at 03/27/17 1750  . carvedilol (COREG) tablet 6.25 mg  6.25 mg Oral BID WC Clapacs, John T, MD   6.25 mg at 03/28/17 0900  . clopidogrel (PLAVIX) tablet 75 mg  75 mg Oral Daily Clapacs, John T, MD   75 mg at 03/28/17 0900  . escitalopram (LEXAPRO) tablet 20 mg  20 mg Oral Daily Ramond Dial, MD   20 mg at 03/28/17 0900  . hydrOXYzine (ATARAX/VISTARIL) tablet 50 mg  50 mg Oral TID PRN Marylin Crosby, MD   50 mg at 03/22/17 1135  . Lurasidone HCl TABS 60 mg  60 mg Oral Q breakfast Ramond Dial, MD   60 mg at 03/28/17 0901  . magnesium hydroxide (MILK OF MAGNESIA) suspension 30 mL  30 mL Oral Daily PRN Clapacs, John T, MD      . metFORMIN (GLUCOPHAGE) tablet 500 mg  500 mg Oral BID WC Clapacs, John T, MD   500 mg at 03/28/17 0900  . mirtazapine (REMERON) tablet 15 mg  15 mg Oral QHS Yamen Castrogiovanni, Tyson Babinski, MD   15 mg at 03/27/17 2105  . multivitamin with minerals tablet 1 tablet  1 tablet Oral Daily Parlee Amescua, Tyson Babinski, MD   1 tablet at 03/28/17 0901  . nicotine (NICODERM CQ -  dosed in mg/24 hours) patch 21 mg  21 mg Transdermal Daily Latrease Kunde, Tyson Babinski, MD   21 mg at 03/28/17 0900  . pneumococcal 23 valent vaccine (PNU-IMMUNE) injection 0.5 mL  0.5 mL Intramuscular Tomorrow-1000 Thoma Paulsen R, MD      . prazosin (MINIPRESS) capsule 2 mg  2 mg Oral QHS Clapacs, Madie Reno, MD   2 mg at 03/27/17 2105  . thiamine (VITAMIN B-1) tablet 100 mg  100 mg Oral Daily Di Jasmer, Tyson Babinski, MD   100 mg at 03/28/17 0902  . traZODone (DESYREL) tablet 100 mg  100 mg Oral QHS PRN Ramond Dial, MD   100 mg at 03/25/17 2009  . venlafaxine XR (EFFEXOR-XR) 24 hr capsule 37.5 mg  37.5 mg Oral Q breakfast Novali Vollman R, MD   37.5 mg at 03/28/17 0902  . zolpidem (AMBIEN) tablet 10 mg  10 mg Oral QHS Ramond Dial, MD   10 mg at 03/27/17 2105    Lab Results: No results found for this or any previous visit (from the past 48 hour(s)).  Blood Alcohol level:  Lab Results  Component Value Date   ETH 37 (H) 03/12/2017   ETH <10 23/55/7322    Metabolic Disorder Labs: Lab Results  Component Value Date   HGBA1C 6.0 (H) 03/13/2017   MPG 126 03/13/2017   MPG 177 09/12/2016   Lab Results  Component Value Date   PROLACTIN 18.1 (H) 09/12/2016   PROLACTIN 6.5 05/27/2016   Lab Results  Component Value Date   CHOL 118 03/13/2017   TRIG 325 (H) 03/13/2017   HDL 31 (L) 03/13/2017   CHOLHDL 3.8 03/13/2017   VLDL 65 (H) 03/13/2017   LDLCALC 22 03/13/2017   LDLCALC 37 09/12/2016    Physical Findings: AIMS: Facial and Oral Movements Muscles of Facial Expression: None, normal Lips and Perioral Area: None, normal Jaw: None, normal Tongue: None, normal,Extremity Movements Upper (arms, wrists, hands, fingers): None, normal Lower (legs, knees, ankles, toes): None, normal, Trunk Movements Neck, shoulders, hips: None, normal, Overall Severity Severity of abnormal movements (highest score from questions above): None, normal  Incapacitation due to abnormal movements: None, normal Patient's  awareness of abnormal movements (rate only patient's report): No Awareness, Dental Status Current problems with teeth and/or dentures?: No Does patient usually wear dentures?: No  CIWA:  CIWA-Ar Total: 0 COWS:  COWS Total Score: 2  Musculoskeletal: Strength & Muscle Tone: within normal limits Gait & Station: normal Patient leans: N/A  Psychiatric Specialty Exam: Physical Exam  Nursing note and vitals reviewed.   Review of Systems  All other systems reviewed and are negative.   Blood pressure 110/62, pulse 75, temperature 97.9 F (36.6 C), temperature source Oral, resp. rate 18, height 6\' 2"  (1.88 m), weight 88.5 kg (195 lb), SpO2 98 %.Body mass index is 25.04 kg/m.  General Appearance: Casual  Eye Contact:  Good  Speech:  Clear and Coherent  Volume:  Normal  Mood:  Depressed  Affect:  Congruent  Thought Process:  Coherent and Goal Directed  Orientation:  Full (Time, Place, and Person)  Thought Content:  Negative  Suicidal Thoughts:  Yes.  without intent/plan  Homicidal Thoughts:  No  Memory:  Immediate;   Fair  Judgement:  Fair  Insight:  Fair  Psychomotor Activity:  Normal  Concentration:  Concentration: Fair  Recall:  [employer] of Knowledge:  Fair  Language:  Fair  Akathisia:  No      Assets:  Communication Skills Desire for Improvement Housing Resilience  ADL's:  Intact  Cognition:  WNL  Sleep:  Number of Hours: 7.3     Treatment Plan Summary: 47 yo male admitted due to Malcolm. Pt is set to start ECT on Friday. Mood is improving as he is looking forward to this.   Plan:  Mood disorder -Continue Latuda 60 mg daily -Continue Effexor 37.5 mg daily. He is in the process of tapering this off -Continue LExapro 20 mg daily -Pt is set to start ECT on Friday -Continue Remeron 15 mg qhs  Insomnia -improving  HTN -Carvedilol  Dispo -He will start ECT on Friday   Marylin Crosby, MD 03/28/2017, 2:16 PM

## 2017-03-28 NOTE — Plan of Care (Signed)
  Progressing Self-Concept: Ability to disclose and discuss suicidal ideas will improve 03/28/2017 2001 - Progressing by Alyson Locket I, RN Ability to verbalize positive feelings about self will improve 03/28/2017 2001 - Progressing by Anson [place], RN Education: Knowledge of the prescribed therapeutic regimen will improve 03/28/2017 2001 - Progressing by Anson [place], RN

## 2017-03-28 NOTE — Consult Note (Signed)
ECT: I have talked with the patient and the treatment team and spoken with the ECT team.  Patient is scheduled to begin ECT with a first treatment on Friday.  I will follow up with another note with him tomorrow.

## 2017-03-28 NOTE — Progress Notes (Signed)
Sisters phone number: [phone]. Michael Schmitt states that her phone should be turned back on on Thursday

## 2017-03-28 NOTE — Tx Team (Signed)
Interdisciplinary Treatment and Diagnostic Plan Update  03/28/2017 Time of Session: 11:00 AM Michael Schmitt MRN: [id number]  Principal Diagnosis: Severe recurrent major depression without psychotic features [facility])  Secondary Diagnoses: Principal Problem:   Severe recurrent major depression without psychotic features ([place]) Active Problems:   Tobacco use disorder   Current Medications:  Current Facility-Administered Medications  Medication Dose Route Frequency Provider Last Rate Last Dose  . acetaminophen (TYLENOL) tablet 650 mg  650 mg Oral Q6H PRN Clapacs, Madie Reno, MD   650 mg at 03/28/17 1240  . alum & mag hydroxide-simeth (MAALOX/MYLANTA) 200-200-20 MG/5ML suspension 30 mL  30 mL Oral Q4H PRN Clapacs, John T, MD      . aspirin EC tablet 81 mg  81 mg Oral Daily Clapacs, John T, MD   81 mg at 03/28/17 0900  . atorvastatin (LIPITOR) tablet 80 mg  80 mg Oral q1800 Clapacs, Madie Reno, MD   80 mg at 03/27/17 1750  . carvedilol (COREG) tablet 6.25 mg  6.25 mg Oral BID WC Clapacs, John T, MD   6.25 mg at 03/28/17 0900  . clopidogrel (PLAVIX) tablet 75 mg  75 mg Oral Daily Clapacs, John T, MD   75 mg at 03/28/17 0900  . escitalopram (LEXAPRO) tablet 20 mg  20 mg Oral Daily Ramond Dial, MD   20 mg at 03/28/17 0900  . hydrOXYzine (ATARAX/VISTARIL) tablet 50 mg  50 mg Oral TID PRN Marylin Crosby, MD   50 mg at 03/22/17 1135  . ibuprofen (ADVIL,MOTRIN) tablet 600 mg  600 mg Oral Q6H PRN McNew, Tyson Babinski, MD      . Lurasidone HCl TABS 60 mg  60 mg Oral Q breakfast Ramond Dial, MD   60 mg at 03/28/17 0901  . magnesium hydroxide (MILK OF MAGNESIA) suspension 30 mL  30 mL Oral Daily PRN Clapacs, John T, MD      . metFORMIN (GLUCOPHAGE) tablet 500 mg  500 mg Oral BID WC Clapacs, John T, MD   500 mg at 03/28/17 0900  . mirtazapine (REMERON) tablet 15 mg  15 mg Oral QHS McNew, Tyson Babinski, MD   15 mg at 03/27/17 2105  . multivitamin with minerals tablet 1 tablet  1 tablet Oral Daily McNew, Tyson Babinski,  MD   1 tablet at 03/28/17 0901  . nicotine (NICODERM CQ - dosed in mg/24 hours) patch 21 mg  21 mg Transdermal Daily McNew, Tyson Babinski, MD   21 mg at 03/28/17 0900  . pneumococcal 23 valent vaccine (PNU-IMMUNE) injection 0.5 mL  0.5 mL Intramuscular Tomorrow-1000 McNew, Holly R, MD      . prazosin (MINIPRESS) capsule 2 mg  2 mg Oral QHS Clapacs, Madie Reno, MD   2 mg at 03/27/17 2105  . thiamine (VITAMIN B-1) tablet 100 mg  100 mg Oral Daily McNew, Tyson Babinski, MD   100 mg at 03/28/17 0902  . traZODone (DESYREL) tablet 100 mg  100 mg Oral QHS PRN Ramond Dial, MD   100 mg at 03/25/17 2009  . venlafaxine XR (EFFEXOR-XR) 24 hr capsule 37.5 mg  37.5 mg Oral Q breakfast McNew, Tyson Babinski, MD   37.5 mg at 03/28/17 7017  . zolpidem (AMBIEN) tablet 10 mg  10 mg Oral QHS Ramond Dial, MD   10 mg at 03/27/17 2105   PTA Medications: Medications Prior to Admission  Medication Sig Dispense Refill Last Dose  . aspirin EC 81 MG EC tablet Take 1 tablet (81 mg total) by mouth  daily. 30 tablet 0 Taking  . atorvastatin (LIPITOR) 80 MG tablet Take 1 tablet (80 mg total) by mouth daily at 6 PM. 30 tablet 0 Taking  . carvedilol (COREG) 6.25 MG tablet Take 1 tablet (6.25 mg total) by mouth 2 (two) times daily with a meal. 60 tablet 0 Taking  . clopidogrel (PLAVIX) 75 MG tablet Take 1 tablet (75 mg total) by mouth daily. 30 tablet 0 Taking  . divalproex (DEPAKOTE) 500 MG DR tablet Take 500 mg by mouth 2 (two) times daily.   Taking  . gabapentin (NEURONTIN) 400 MG capsule Take 2 capsules (800 mg total) by mouth 4 (four) times daily. 240 capsule 0 Taking  . hydrOXYzine (ATARAX/VISTARIL) 50 MG tablet Take 50 mg by mouth 3 (three) times daily as needed.   Taking  . metFORMIN (GLUCOPHAGE) 500 MG tablet Take 1 tablet (500 mg total) by mouth 2 (two) times daily with a meal. 60 tablet 0 Taking  . mirtazapine (REMERON) 45 MG tablet Take 1 tablet (45 mg total) by mouth at bedtime. 30 tablet 0 Taking  . nitroGLYCERIN (NITROSTAT)  0.4 MG SL tablet Place 1 tablet (0.4 mg total) under the tongue every 5 (five) minutes x 3 doses as needed. 10 tablet 0 Taking  . prazosin (MINIPRESS) 2 MG capsule Take 2 mg by mouth at bedtime.   Taking  . risperiDONE (RISPERDAL) 2 MG tablet Take 2 mg by mouth at bedtime.   Taking  . venlafaxine XR (EFFEXOR-XR) 75 MG 24 hr capsule Take 3 capsules (225 mg total) daily with breakfast by mouth. 90 capsule 1     Patient Stressors: Health problems Medication change or noncompliance Occupational concerns Substance abuse  Patient Strengths: Average or above average intelligence Capable of independent living Communication skills Motivation for treatment/growth Supportive family/friends  Treatment Modalities: Medication Management, Group therapy, Case management,  1 to 1 session with clinician, Psychoeducation, Recreational therapy.   Physician Treatment Plan for Primary Diagnosis: Severe recurrent major depression without psychotic features ([place]) Long Term Goal(s): Improvement in symptoms so as ready for discharge   Short Term Goals: Ability to disclose and discuss suicidal ideas Ability to demonstrate self-control will improve  Medication Management: Evaluate patient's response, side effects, and tolerance of medication regimen.  Therapeutic Interventions: 1 to 1 sessions, Unit Group sessions and Medication administration.  Evaluation of Outcomes: Progressing  Physician Treatment Plan for Secondary Diagnosis: Principal Problem:   Severe recurrent major depression without psychotic features ([place]) Active Problems:   Tobacco use disorder  Long Term Goal(s): Improvement in symptoms so as ready for discharge   Short Term Goals: Ability to disclose and discuss suicidal ideas Ability to demonstrate self-control will improve     Medication Management: Evaluate patient's response, side effects, and tolerance of medication regimen.  Therapeutic Interventions: 1 to 1 sessions, Unit Group  sessions and Medication administration.  Evaluation of Outcomes: Progressing   RN Treatment Plan for Primary Diagnosis: Severe recurrent major depression without psychotic features ([place]) Long Term Goal(s): Knowledge of disease and therapeutic regimen to maintain health will improve  Short Term Goals: Ability to participate in decision making will improve, Ability to identify and develop effective coping behaviors will improve and Compliance with prescribed medications will improve  Medication Management: RN will administer medications as ordered by provider, will assess and evaluate patient's response and provide education to patient for prescribed medication. RN will report any adverse and/or side effects to prescribing provider.  Therapeutic Interventions: 1 on 1 counseling sessions, Psychoeducation, Medication  administration, Evaluate responses to treatment, Monitor vital signs and CBGs as ordered, Perform/monitor CIWA, COWS, AIMS and Fall Risk screenings as ordered, Perform wound care treatments as ordered.  Evaluation of Outcomes: Progressing   LCSW Treatment Plan for Primary Diagnosis: Severe recurrent major depression without psychotic features ([place]) Long Term Goal(s): Safe transition to appropriate next level of care at discharge, Engage patient in therapeutic group addressing interpersonal concerns.  Short Term Goals: Engage patient in aftercare planning with referrals and resources, Identify triggers associated with mental health/substance abuse issues and Increase skills for wellness and recovery  Therapeutic Interventions: Assess for all discharge needs, 1 to 1 time with Social worker, Explore available resources and support systems, Assess for adequacy in community support network, Educate family and significant other(s) on suicide prevention, Complete Psychosocial Assessment, Interpersonal group therapy.  Evaluation of Outcomes: Progressing   Progress in Treatment: Attending  groups: No. Participating in groups: No. Taking medication as prescribed: Yes. Toleration medication: Yes. Family/Significant other contact made: No, will contact:  Two attempts made for Shinichi's sister, Olin Hauser with no callbacks.-Pt says sisters phone has been disconnected and has no other numbers he will give for contacts. Patient understands diagnosis: Yes. Discussing patient identified problems/goals with staff: Yes. Medical problems stabilized or resolved: Yes. Denies suicidal/homicidal ideation: Yes. Issues/concerns per patient self-inventory: No. Other:    New problem(s) identified: No, Describe:     New Short Term/Long Term Goal(s): to go to residential treatment program    Discharge Plan or Barriers: No availability of residential beds for substance abuse treatment;  No significant improvement in psychiatric symptoms-Pt will begin ECT treatments on Friday.  Unable to make arrangements at this time for outpatient ECT.  Reason for Continuation of Hospitalization: Depression Medication stabilization Suicidal ideation  Estimated Length of Stay: 5-7 days  Recreational Therapy: Patient Stressors: SI Patient Goal: Patient will identify 3 healthy leisure activities to participate in post discharge x5 days.  Attendees: Patient: 03/23/2017 11:14 AM  Physician: Amador Cunas, MD 03/23/2017 11:14 AM  Nursing: 03/23/2017 11:14 AM  RN Care Manager:  03/23/2017 11:14 AM  Social Worker: Dossie Arbour, LCSW 03/23/2017 11:14 AM  Recreational Therapist: Roanna Epley, LRT/CTRS 03/23/2017 11:14 AM  Other:  03/23/2017 11:14 AM  Other:  03/23/2017 11:14 AM  Other: 03/23/2017 11:14 AM     Scribe for Treatment Team: August Saucer, LCSW 03/28/2017 3:17 PM

## 2017-03-28 NOTE — Progress Notes (Signed)
Recreation Therapy Notes  Date: 01.02.2019   Time: 9:30 am   Location: Craft Room   Behavioral response: N/A   Intervention Topic: Anger   Discussion/Intervention: Patient did not attend group. Clinical Observations/Feedback:  Patient did not attend group.  Zarielle Cea LRT/CTRS         Liyah Higham 03/28/2017 12:31 PM

## 2017-03-28 NOTE — [facility] Group Notes (Signed)
  03/28/2017  Time: 1:00PM  Type of Therapy/Topic:  Group Therapy:  Emotion Regulation  Participation Level:  Did Not Attend   Description of Group:    The purpose of this group is to assist patients in learning to regulate negative emotions and experience positive emotions. Patients will be guided to discuss ways in which they have been vulnerable to their negative emotions. These vulnerabilities will be juxtaposed with experiences of positive emotions or situations, and patients will be challenged to use positive emotions to combat negative ones. Special emphasis will be placed on coping with negative emotions in conflict situations, and patients will process healthy conflict resolution skills.  Therapeutic Goals: 1. Patient will identify two positive emotions or experiences to reflect on in order to balance out negative emotions 2. Patient will label two or more emotions that they find the most difficult to experience 3. Patient will demonstrate positive conflict resolution skills through discussion and/or role plays  Summary of Patient Progress: Pt was invited to attend group but chose not to attend. CSW will continue to encourage pt to attend group throughout their admission.   Therapeutic Modalities:   Cognitive Behavioral Therapy Feelings Identification Dialectical Behavioral Therapy  Alden Hipp, MSW, LCSW 03/28/2017 1:26 PM

## 2017-03-28 NOTE — Progress Notes (Signed)
Patient is safe and secure in the unit no complain of suicidal thoughts or ideations, no signs of depression present, patient took his medications, sleeping long hours without any interruptions, patient endorses for safety of self others. No respiratory distress noted.

## 2017-03-28 NOTE — Plan of Care (Signed)
Patient up ad lib with steady gait, alert and oriented x 4. Affect remains flat and sad but denies SI/HI/AVH. Observed in his room most of the day reading a book. Compliant with meals and medication. Milieu remains safe, will continue to monitor for changes in condition.

## 2017-03-28 NOTE — Progress Notes (Signed)
Patient up ad lib with steady gait, alert and oriented x 4. Affect remains flat and sad but denies SI/HI/AVH. Observed in his room most of the day reading a book. Compliant with meals and medication. Milieu remains safe, will continue to monitor for changes in condition.

## 2017-03-28 NOTE — Progress Notes (Signed)
Recreation Therapy Notes  Date: 01.02.2018  Time: 3:00pm  Location: Craft room  Behavioral response: Appropriate  Group Type: Craft  Participation level: Active  Communication: Patient was social with peers and staff.  Comments: N/A  Doyle Kunath LRT/CTRS        Seann Genther 03/28/2017 4:07 PM

## 2017-03-29 ENCOUNTER — Other Ambulatory Visit: Payer: Self-pay | Admitting: Psychiatry

## 2017-03-29 NOTE — B[facility] Group Notes (Signed)
03/29/2017  Time: 0930  Type of Therapy/Topic:  Group Therapy:  Balance in Life  Participation Level:  Did Not Attend  Description of Group:   This group will address the concept of balance and how it feels and looks when one is unbalanced. Patients will be encouraged to process areas in their lives that are out of balance and identify reasons for remaining unbalanced. Facilitators will guide patients in utilizing problem-solving interventions to address and correct the stressor making their life unbalanced. Understanding and applying boundaries will be explored and addressed for obtaining and maintaining a balanced life. Patients will be encouraged to explore ways to assertively make their unbalanced needs known to significant others in their lives, using other group members and facilitator for support and feedback.  Therapeutic Goals: 1. Patient will identify two or more emotions or situations they have that consume much of in their lives. 2. Patient will identify signs/triggers that life has become out of balance:  3. Patient will identify two ways to set boundaries in order to achieve balance in their lives:  4. Patient will demonstrate ability to communicate their needs through discussion and/or role plays  Summary of Patient Progress: Pt was invited to attend group but chose not to attend. CSW will continue to encourage pt to attend group throughout their admission.   Therapeutic Modalities:   Cognitive Behavioral Therapy Solution-Focused Therapy Assertiveness Training   Alden Hipp, MSW, LCSW 03/29/2017 10:18 AM

## 2017-03-29 NOTE — B[facility] Group Notes (Signed)
[facility] Group Notes:  (Nursing/MHT/Case Management/Adjunct)  Date:  03/29/2017  Time:  2:24 AM  Type of Therapy:  Psychoeducational Skills  Participation Level:  None   Joretta Bachelor 03/29/2017, 2:24 AM

## 2017-03-29 NOTE — Progress Notes (Signed)
D: Affect  Flat on  Approach. Limited interaction with peers and staff. Appropriate ADL'S and personal chores . Patient  Continue to wear scrubs  On the unit . Appetite good and voice no concerns around  Sleep.  Patient paces hall during shift . Patient does not attend groups  Or unit programing. Denies suicidal ideations and homicidal .  No pain concerns . Patient knowledgeable of disease process. Aware of possibility of discharge tomorrow  following ECT. Patient able to identify changes in his lifestyle , understands  resources available  for him . Continue to voice of suicidal ideation , no plan. Able to verbalize medications received . Attending some of the groups on unit , limited participation. Patient not yet able to control anxiety . Working on Radiographer, therapeutic   A: Encourage patient participation with unit programming . Instruction  Given on  Medication , verbalize understanding.  R: Voice no other concerns. Staff continue to monitor

## 2017-03-29 NOTE — [facility] Group Notes (Signed)
[facility] Group Notes:  (Nursing/MHT/Case Management/Adjunct)  Date:  03/29/2017  Time:  9:45 PM  Type of Therapy:  Group Therapy  Participation Level:  Active  Participation Quality:  Appropriate  Affect:  Appropriate  Cognitive:  Alert  Insight:  Good  Engagement in Group:  Engaged  Modes of Intervention:  Support  Summary of Progress/Problems:  Michael Schmitt 03/29/2017, 9:45 PM

## 2017-03-29 NOTE — Progress Notes (Signed)
I have attempted to contact his sister to discuss her ability to give him transportation to his ECT appointments. There was no answer. Will continue to try to call her.

## 2017-03-29 NOTE — Consult Note (Signed)
ECT: Follow-up note for this 47 year old man.  Plans are now in place that he will have ECT treatment in the morning.  I proposed bilateral treatment.  I reviewed his medicines.  He is not taking anything right now that would be a significant impairment.  I did order another EKG tonight otherwise labs look fine.  Patient is no longer reporting active suicidal ideation although his chronic depression and anxiety are still there.  Plan currently is for him to probably go home tomorrow afternoon or evening and then follow-up with outpatient treatment.  I will follow up with note and discharge tomorrow if necessary.  Patient was told about the whole plan and agreed to it.

## 2017-03-29 NOTE — B[facility] Group Notes (Signed)
LCSW Group Therapy Note 03/29/2017 9:00 AM  Type of Therapy and Topic:  Group Therapy:  Setting Goals  Participation Level:  Did Not Attend  Description of Group: In this process group, patients discussed using strengths to work toward goals and address challenges.  Patients identified two positive things about themselves and one goal they were working on.  Patients were given the opportunity to share openly and support each other's plan for self-empowerment.  The group discussed the value of gratitude and were encouraged to have a daily reflection of positive characteristics or circumstances.  Patients were encouraged to identify a plan to utilize their strengths to work on current challenges and goals.  Therapeutic Goals 1. Patient will verbalize personal strengths/positive qualities and relate how these can assist with achieving desired personal goals 2. Patients will verbalize affirmation of peers plans for personal change and goal setting 3. Patients will explore the value of gratitude and positive focus as related to successful achievement of goals 4. Patients will verbalize a plan for regular reinforcement of personal positive qualities and circumstances.  Summary of Patient Progress:       Therapeutic Modalities Cognitive Behavioral Therapy Motivational Interviewing    [name], LCSW 03/29/2017 5:15 PM

## 2017-03-29 NOTE — Progress Notes (Signed)
[facility] MD Progress Note  03/29/2017 1:16 PM Michael Schmitt  MRN:  [id number] Subjective:  Pt states that mood is improving and suicidal thoughts are improving. He continues to endorse passive SI. He is looking forward to ECT tomorrow. HE is sleeping better since starting back on Remeron. He states that he is hopeful that his sister will be able to bring him to his ECT appointments as an outpatient.   Principal Problem: Severe recurrent major depression without psychotic features ([place]) Diagnosis:   Patient Active Problem List   Diagnosis Date Noted  . Severe recurrent major depression without psychotic features ([place]) [F33.2] 03/12/2017    Priority: High  . Generalized anxiety disorder [F41.1] 02/05/2017  . Chronic neck pain (Primary Area of Pain) (Left) [M54.2, G89.29] 11/22/2016  . Cervical facet syndrome (Left) [D22.025] 11/22/2016  . Cervical radiculitis (Left) [M54.12] 11/22/2016  . History of cervical spinal surgery [Z98.890] 11/22/2016  . Cervical spondylosis [M47.22] 11/22/2016  . Chronic shoulder pain (Secondary Area of Pain) (Left) [M25.512, G89.29] 11/22/2016  . 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 Chronic low back pain [facility] Area of Pain) (Bilateral) (L>R) [M54.5, G89.29] 11/22/2016  . Lumbar facet syndrome (Bilateral) (L>R) [M47.816] 11/22/2016  . Chronic pain syndrome [G89.4] 11/21/2016  . Long term prescription benzodiazepine use [Z79.899] 11/21/2016  . Opiate use [F11.90] 11/21/2016  . Uncontrolled type 2 diabetes mellitus without complication, without long-term current use of insulin ([place]) [E11.65] 10/19/2016  . Chronic Suicidal ideation [R45.851] 09/15/2016  . Coronary artery disease [I25.10] 09/12/2016  . HTN (hypertension) [I10] 06/26/2016  . GERD (gastroesophageal reflux disease) [K21.9] 06/26/2016  . Alcohol use disorder, severe, 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[place]) [F10.21] 06/26/2016  . 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  Sedative, hypnotic or anxiolytic use disorder, severe, dependence ([place]) [F13.20] 05/12/2016  . Ventricular fibrillation ([place]) [I49.01] 04/05/2016  . 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 Cervical spondylosis without myelopathy [M47.812] 07/24/2014  . Severe episode of recurrent major depressive disorder, without psychotic features ([place]) [F33.2] 04/15/2013  . Tobacco use disorder [F17.200] 10/20/2010  . Asthma [J45.909] 10/20/2010   Total Time spent with patient: 20 minutes  Past Psychiatric History: See H&P  Past Medical History:  Past Medical History:  Diagnosis Date  . Anxiety   . Anxiety   . Arthritis    cerv. stenosis, spondylosis, HNP- lower back , has been followed in pain clinic, has  had injection s in cerv. area  . Blood dyscrasia    told that when he was young he was a" free bleeder"  . CAD (coronary artery disease)   . Cervical spondylosis without myelopathy 07/24/2014  . Cervicogenic headache 07/24/2014  . Chronic kidney disease    renal calculi- passed spontaneously  . Depression   . Diabetes mellitus without complication ([place])   . Fatty liver   . GERD (gastroesophageal reflux disease)   . Headache(784.0)   . Hyperlipidemia   . Hypertension   . Mental disorder   . MI, old   . RLS (restless legs syndrome)    detected on sleep study  . Shortness of breath   . Ventricular fibrillation ([place]) 04/05/2016    Past Surgical History:  Procedure Laterality Date  . ANTERIOR CERVICAL DECOMP/DISCECTOMY FUSION  11/13/2011   Procedure: ANTERIOR CERVICAL DECOMPRESSION/DISCECTOMY FUSION 1 LEVEL;  Surgeon: [name], MD;  Location: [facility];  Service: Neurosurgery;  Laterality: N/A;  Anterior Cervical Decompression/discectomy Fusion. Cervical three-four.  Marland Kitchen CARDIAC CATHETERIZATION N/A 01/01/2015   Procedure:  Left Heart Cath and Coronary Angiography;  Surgeon: [name], MD;  Location: [facility];  Service: Cardiovascular;  Laterality: N/A;  . CARDIAC  CATHETERIZATION N/A 04/05/2016   Procedure: Left Heart Cath and Coronary Angiography;  Surgeon: Belva Crome, MD;  Location: [facility];  Service: Cardiovascular;  Laterality: N/A;  . CARDIAC CATHETERIZATION N/A 04/05/2016   Procedure: Coronary Stent Intervention;  Surgeon: Belva Crome, MD;  Location: [facility];  Service: Cardiovascular;  Laterality: N/A;  . CARDIAC CATHETERIZATION N/A 04/05/2016   Procedure: Intravascular Ultrasound/IVUS;  Surgeon: Belva Crome, MD;  Location: [facility];  Service: Cardiovascular;  Laterality: N/A;  . NASAL SINUS SURGERY     2005   Family History:  Family History  Problem Relation Age of Onset  . Prostate cancer Father   . Hypertension Mother   . Kidney Stones Mother   . Anxiety disorder Mother   . Depression Mother   . COPD Sister   . Hypertension Sister   . Diabetes Sister   . Depression Sister   . Anxiety disorder Sister   . Seizures Sister   . ADD / ADHD Son   . ADD / ADHD Daughter    Family Psychiatric  History: See H&P Social History:  Social History   Substance and Sexual Activity  Alcohol Use No  . Alcohol/week: 0.0 oz   Comment: last drinked in 3 months.      Social History   Substance and Sexual Activity  Drug Use No    Social History   Socioeconomic History  . Marital status: Widowed    Spouse name: None  . Number of children: 3  . Years of education: 40  . Highest education level: None  Social Needs  . Financial resource strain: None  . Food insecurity - worry: None  . Food insecurity - inability: None  . Transportation needs - medical: None  . Transportation needs - non-medical: None  Occupational History    Comment: unemployed  Tobacco Use  . Smoking status: Current Every Day Smoker    Packs/day: 2.00    Years: 17.00    Pack years: 34.00    Types: Cigarettes, Cigars  . Smokeless tobacco: Former Systems developer  . Tobacco comment: occassional snuff   Substance and Sexual Activity  . Alcohol  use: No    Alcohol/week: 0.0 oz    Comment: last drinked in 3 months.   . Drug use: No  . Sexual activity: Not Currently  Other Topics Concern  . None  Social History Narrative   Patient is right handed.   Patient drinks 2 sodas daily.   Additional Social History:                         Sleep: Fair  Appetite:  Good  Current Medications: Current Facility-Administered Medications  Medication Dose Route Frequency Provider Last Rate Last Dose  . acetaminophen (TYLENOL) tablet 650 mg  650 mg Oral Q6H PRN Clapacs, Madie Reno, MD   650 mg at 03/28/17 2105  . alum & mag hydroxide-simeth (MAALOX/MYLANTA) 200-200-20 MG/5ML suspension 30 mL  30 mL Oral Q4H PRN Clapacs, John T, MD      . aspirin EC tablet 81 mg  81 mg Oral Daily Clapacs, Madie Reno, MD   81 mg at 03/29/17 0754  . atorvastatin (LIPITOR) tablet 80 mg  80 mg Oral q1800 Clapacs, Madie Reno, MD   80 mg  at 03/28/17 1731  . carvedilol (COREG) tablet 6.25 mg  6.25 mg Oral BID WC Clapacs, Madie Reno, MD   6.25 mg at 03/29/17 0754  . clopidogrel (PLAVIX) tablet 75 mg  75 mg Oral Daily Clapacs, Madie Reno, MD   75 mg at 03/29/17 0754  . escitalopram (LEXAPRO) tablet 20 mg  20 mg Oral Daily Ramond Dial, MD   20 mg at 03/29/17 0754  . hydrOXYzine (ATARAX/VISTARIL) tablet 50 mg  50 mg Oral TID PRN Marylin Crosby, MD   50 mg at 03/29/17 1038  . ibuprofen (ADVIL,MOTRIN) tablet 600 mg  600 mg Oral Q6H PRN Marylin Crosby, MD   600 mg at 03/29/17 1038  . lurasidone (LATUDA) tablet 60 mg  60 mg Oral Q breakfast Ramond Dial, MD   60 mg at 03/29/17 0754  . magnesium hydroxide (MILK OF MAGNESIA) suspension 30 mL  30 mL Oral Daily PRN Clapacs, John T, MD      . metFORMIN (GLUCOPHAGE) tablet 500 mg  500 mg Oral BID WC Clapacs, Madie Reno, MD   500 mg at 03/29/17 0754  . mirtazapine (REMERON) tablet 15 mg  15 mg Oral QHS Marylin Crosby, MD   15 mg at 03/28/17 2104  . multivitamin with minerals tablet 1 tablet  1 tablet Oral Daily Culley Hedeen, Tyson Babinski, MD   1  tablet at 03/29/17 0754  . nicotine (NICODERM CQ - dosed in mg/24 hours) patch 21 mg  21 mg Transdermal Daily Geneva Pallas, Tyson Babinski, MD   21 mg at 03/29/17 0830  . pneumococcal 23 valent vaccine (PNU-IMMUNE) injection 0.5 mL  0.5 mL Intramuscular Tomorrow-1000 Oluwadamilare Tobler R, MD      . prazosin (MINIPRESS) capsule 2 mg  2 mg Oral QHS Clapacs, Madie Reno, MD   2 mg at 03/28/17 2104  . thiamine (VITAMIN B-1) tablet 100 mg  100 mg Oral Daily Thornton Dohrmann, Tyson Babinski, MD   100 mg at 03/29/17 0754  . traZODone (DESYREL) tablet 100 mg  100 mg Oral QHS PRN Ramond Dial, MD   100 mg at 03/25/17 2009  . venlafaxine XR (EFFEXOR-XR) 24 hr capsule 37.5 mg  37.5 mg Oral Q breakfast Beatriz Settles R, MD   37.5 mg at 03/29/17 0754  . zolpidem (AMBIEN) tablet 10 mg  10 mg Oral QHS Ramond Dial, MD   10 mg at 03/28/17 2105    Lab Results: No results found for this or any previous visit (from the past 48 hour(s)).  Blood Alcohol level:  Lab Results  Component Value Date   ETH 37 (H) 03/12/2017   ETH <10 31/54/0086    Metabolic Disorder Labs: Lab Results  Component Value Date   HGBA1C 6.0 (H) 03/13/2017   MPG 126 03/13/2017   MPG 177 09/12/2016   Lab Results  Component Value Date   PROLACTIN 18.1 (H) 09/12/2016   PROLACTIN 6.5 05/27/2016   Lab Results  Component Value Date   CHOL 118 03/13/2017   TRIG 325 (H) 03/13/2017   HDL 31 (L) 03/13/2017   CHOLHDL 3.8 03/13/2017   VLDL 65 (H) 03/13/2017   LDLCALC 22 03/13/2017   LDLCALC 37 09/12/2016    Physical Findings: AIMS: Facial and Oral Movements Muscles of Facial Expression: None, normal Lips and Perioral Area: None, normal Jaw: None, normal Tongue: None, normal,Extremity Movements Upper (arms, wrists, hands, fingers): None, normal Lower (legs, knees, ankles, toes): None, normal, Trunk Movements Neck, shoulders, hips: None, normal, Overall Severity Severity of abnormal movements (  highest score from questions above): None, normal Incapacitation due  to abnormal movements: None, normal Patient's awareness of abnormal movements (rate only patient's report): No Awareness, Dental Status Current problems with teeth and/or dentures?: No Does patient usually wear dentures?: No  CIWA:  CIWA-Ar Total: 0 COWS:  COWS Total Score: 2  Musculoskeletal: Strength & Muscle Tone: within normal limits Gait & Station: normal Patient leans: N/A  Psychiatric Specialty Exam: Physical Exam  Nursing note and vitals reviewed.   Review of Systems  All other systems reviewed and are negative.   Blood pressure 135/69, pulse 66, temperature 97.6 F (36.4 C), temperature source Oral, resp. rate 18, height 6\' 2"  (1.88 m), weight 88.5 kg (195 lb), SpO2 96 %.Body mass index is 25.04 kg/m.  General Appearance: Casual and Fairly Groomed  Eye Contact:  Good  Speech:  Clear and Coherent  Volume:  Normal  Mood:  Depressed  Affect:  Congruent  Thought Process:  Coherent and Goal Directed  Orientation:  Full (Time, Place, and Person)  Thought Content:  Negative  Suicidal Thoughts:  Yes.  without intent/plan  Homicidal Thoughts:  No  Memory:  Immediate;   Fair  Judgement:  Fair  Insight:  Fair  Psychomotor Activity:  Normal  Concentration:  Concentration: Fair  Recall:  [employer] of Knowledge:  Fair  Language:  Fair  Akathisia:  No      Assets:  Communication Skills Desire for Improvement Housing Resilience  ADL's:  Intact  Cognition:  WNL  Sleep:  Number of Hours: 6.75     Treatment Plan Summary: 47 yo male admitted due to [place]. Mood and SI are improving as he is looking forward to ECT tomorrow.   Plan:  Mood disorder -Will stop Effexor and complete this taper -Continue Latuda 60 mg daily -Continue Lexapro 20 mg daily -Continue Remeron 15 mg qhs -Pt will start ECT tomorrow. CSW is contacting his sister to see if she is able to provide transportation to ECT appointments  Insomnia -Improving  HTN -Carvedilol  Dispo -He will start ECT  tomorrow and hopefully discharge after initial session  Marylin Crosby, MD 03/29/2017, 1:16 PM

## 2017-03-29 NOTE — Progress Notes (Signed)
Recreation Therapy Notes   Date: 01.03.2019  Time: 1:00PM  Location: Craft Room  Behavioral response: Appropriate  Intervention Topic: Values  Discussion/Intervention: Group content today was focused on values. The group identified what values are and where they come from. Individuals expressed some values and how many they have. Patients described how they go about add or removing values. The group described the importance of having values and how they go about using them in daily life. Patient participated in the intervention "My Values" where they were able to pick out values that were important to them and make a visual aide.  Clinical Observations/Feedback:  Patient came to group and was focused on what his peers and staff had to say about the topic at hand. He participated in the intervention and continues to work on his goals.  Ralph Benavidez LRT/CTRS          Tameem Pullara 03/29/2017 2:33 PM

## 2017-03-29 NOTE — Progress Notes (Signed)
D: Patient denies SI/HI/AVH. Patient is flat, depressed and sad during assessment. Patient verbally contracts for safety. Patient is calm, cooperative and pleasant. Patient is seen in milieu interacting with peers. Patient has no complaints at this time. Patient had complaint of pain 2/10.  A: Patient was assessed by this nurse. Patient was oriented to unit. Patient's safety was maintained on unit. Q x 15 minute observation checks were completed for safety. Patient care plan was reviewed. Patient was offered support and encouragement. Patient was encourage to attend groups, participate in unit activities and continue with plan of care.   R: Patient has no complaints of pain at this time. Patient responded well to PRN medications and symptoms were relieved. Patient is receptive to treatment and safety maintained on unit.

## 2017-03-29 NOTE — Plan of Care (Signed)
  Progressing Education: Knowledge of disease or condition will improve 03/29/2017 1729 - Progressing by Leodis Liverpool, RN Understanding of discharge needs will improve 03/29/2017 1729 - Progressing by Leodis Liverpool, RN Health Behavior/Discharge Planning: Ability to identify changes in lifestyle to reduce recurrence of condition will improve 03/29/2017 1729 - Progressing by Leodis Liverpool, RN Identification of resources available to assist in meeting health care needs will improve 03/29/2017 1729 - Progressing by Leodis Liverpool, RN Health Behavior/Discharge Planning: Identification of resources available to assist in meeting health care needs will improve 03/29/2017 1729 - Progressing by Leodis Liverpool, RN Self-Concept: Ability to disclose and discuss suicidal ideas will improve 03/29/2017 1729 - Progressing by Leodis Liverpool, RN Education: Knowledge of the prescribed therapeutic regimen will improve 03/29/2017 1729 - Progressing by Leodis Liverpool, RN Coping: Ability to cope will improve 03/29/2017 1729 - Progressing by Leodis Liverpool, RN Ability to verbalize feelings will improve 03/29/2017 1729 - Progressing by Leodis Liverpool, RN Safety: Ability to disclose and discuss suicidal ideas will improve 03/29/2017 1729 - Progressing by Leodis Liverpool, RN Self-Concept: Ability to verbalize positive feelings about self will improve 03/29/2017 1729 - Progressing by Leodis Liverpool, RN Education: Ability to state activities that reduce stress will improve 03/29/2017 1729 - Progressing by Leodis Liverpool, RN Self-Concept: Ability to identify factors that promote anxiety will improve 03/29/2017 1729 - Progressing by Leodis Liverpool, RN Level of anxiety will decrease 03/29/2017 1729 - Progressing by Leodis Liverpool, RN Ability to modify response to factors that promote anxiety will improve 03/29/2017 1729 - Progressing by Leodis Liverpool, RN

## 2017-03-30 ENCOUNTER — Inpatient Hospital Stay: Payer: Medicare Other

## 2017-03-30 ENCOUNTER — Inpatient Hospital Stay: Payer: Medicare Other | Admitting: Anesthesiology

## 2017-03-30 LAB — GLUCOSE, CAPILLARY
GLUCOSE-CAPILLARY: 124 mg/dL — AB (ref 65–99)
Glucose-Capillary: 177 mg/dL — ABNORMAL HIGH (ref 65–99)

## 2017-03-30 MED ORDER — HALOPERIDOL LACTATE 5 MG/ML IJ SOLN
INTRAMUSCULAR | Status: DC | PRN
  Administered 2017-03-30: 5 mg via INTRAVENOUS

## 2017-03-30 MED ORDER — SUCCINYLCHOLINE CHLORIDE 20 MG/ML IJ SOLN
INTRAMUSCULAR | Status: AC
  Filled 2017-03-30: qty 1

## 2017-03-30 MED ORDER — SODIUM CHLORIDE 0.9 % IV SOLN
500.0000 mL | Freq: Once | INTRAVENOUS | Status: AC
  Administered 2017-03-30: 500 mL via INTRAVENOUS

## 2017-03-30 MED ORDER — KETOROLAC TROMETHAMINE 30 MG/ML IJ SOLN
INTRAMUSCULAR | Status: AC
  Administered 2017-03-30: 30 mg via INTRAVENOUS
  Filled 2017-03-30: qty 1

## 2017-03-30 MED ORDER — MIDAZOLAM HCL 2 MG/2ML IJ SOLN
INTRAMUSCULAR | Status: DC | PRN
  Administered 2017-03-30: 4 mg via INTRAVENOUS

## 2017-03-30 MED ORDER — SUCCINYLCHOLINE CHLORIDE 200 MG/10ML IV SOSY
PREFILLED_SYRINGE | INTRAVENOUS | Status: DC | PRN
  Administered 2017-03-30: 100 mg via INTRAVENOUS

## 2017-03-30 MED ORDER — HALOPERIDOL LACTATE 5 MG/ML IJ SOLN
INTRAMUSCULAR | Status: AC
  Filled 2017-03-30: qty 1

## 2017-03-30 MED ORDER — SODIUM CHLORIDE 0.9 % IV SOLN
INTRAVENOUS | Status: DC | PRN
  Administered 2017-03-30: 10:00:00 via INTRAVENOUS

## 2017-03-30 MED ORDER — MIDAZOLAM HCL 2 MG/2ML IJ SOLN: 2.0000 mg | Freq: Once | INTRAMUSCULAR | Status: AC

## 2017-03-30 MED ORDER — METHOHEXITAL SODIUM 100 MG/10ML IV SOSY
PREFILLED_SYRINGE | INTRAVENOUS | Status: DC | PRN
  Administered 2017-03-30: 100 mg via INTRAVENOUS

## 2017-03-30 MED ORDER — MIDAZOLAM HCL 2 MG/2ML IJ SOLN
INTRAMUSCULAR | Status: AC
  Filled 2017-03-30: qty 4

## 2017-03-30 MED ORDER — KETOROLAC TROMETHAMINE 30 MG/ML IJ SOLN
30.0000 mg | Freq: Once | INTRAMUSCULAR | Status: AC
  Administered 2017-03-30: 30 mg via INTRAVENOUS

## 2017-03-30 NOTE — Anesthesia Preprocedure Evaluation (Signed)
Anesthesia Evaluation  Patient identified by MRN, date of birth, ID band Patient awake    Reviewed: Allergy & Precautions, H&P , NPO status , Patient's Chart, lab work & pertinent test results  History of Anesthesia Complications Negative for: history of anesthetic complications  Airway Mallampati: III  TM Distance: <3 FB Neck ROM: limited    Dental  (+) Chipped, Poor Dentition, Missing   Pulmonary shortness of breath and with exertion, asthma , Current Smoker,           Cardiovascular Exercise Tolerance: Poor hypertension, (-) angina+ CAD and + Past MI  + dysrhythmias      Neuro/Psych  Headaches, PSYCHIATRIC DISORDERS  Neuromuscular disease    GI/Hepatic Neg liver ROS, GERD  Controlled,  Endo/Other  diabetes, Type 2  Renal/GU Renal disease  negative genitourinary   Musculoskeletal  (+) Arthritis ,   Abdominal   Peds  Hematology negative hematology ROS (+)   Anesthesia Other Findings Past Medical History: No date: Anxiety No date: Anxiety No date: Arthritis     Comment:  cerv. stenosis, spondylosis, HNP- lower back , has been               followed in pain clinic, has  had injection s in cerv.               area No date: Blood dyscrasia     Comment:  told that when he was young he was a" free bleeder" No date: CAD (coronary artery disease) 07/24/2014: Cervical spondylosis without myelopathy 07/24/2014: Cervicogenic headache No date: Chronic kidney disease     Comment:  renal calculi- passed spontaneously No date: Depression No date: Diabetes mellitus without complication (HCC) No date: Fatty liver No date: GERD (gastroesophageal reflux disease) No date: Headache(784.0) No date: Hyperlipidemia No date: Hypertension No date: Mental disorder No date: MI, old No date: RLS (restless legs syndrome)     Comment:  detected on sleep study No date: Shortness of breath 04/05/2016: Ventricular fibrillation  ([place])  Past Surgical History: 11/13/2011: ANTERIOR CERVICAL DECOMP/DISCECTOMY FUSION     Comment:  Procedure: ANTERIOR CERVICAL DECOMPRESSION/DISCECTOMY               FUSION 1 LEVEL;  Surgeon: [name], MD;  Location: [place]               NEURO ORS;  Service: Neurosurgery;  Laterality: N/A;                Anterior Cervical Decompression/discectomy Fusion.               Cervical three-four. 01/01/2015: CARDIAC CATHETERIZATION; N/A     Comment:  Procedure: Left Heart Cath and Coronary Angiography;                Surgeon: [name], MD;  Location: [place] CV               LAB;  Service: Cardiovascular;  Laterality: N/A; 04/05/2016: CARDIAC CATHETERIZATION; N/A     Comment:  Procedure: Left Heart Cath and Coronary Angiography;                Surgeon: Belva Crome, MD;  Location: [place] CV               LAB;  Service: Cardiovascular;  Laterality: N/A; 04/05/2016: CARDIAC CATHETERIZATION; N/A     Comment:  Procedure: Coronary Stent Intervention;  Surgeon: Mallie Mussel  Nicholes Stairs, MD;  Location: [place] [facility] LAB;  Service:               Cardiovascular;  Laterality: N/A; 04/05/2016: CARDIAC CATHETERIZATION; N/A     Comment:  Procedure: Intravascular Ultrasound/IVUS;  Surgeon:               Belva Crome, MD;  Location: [place] CV LAB;                Service: Cardiovascular;  Laterality: N/A; No date: NASAL SINUS SURGERY     Comment:  2005  BMI    Body Mass Index:  25.94 kg/m      Reproductive/Obstetrics negative OB ROS                             Anesthesia Physical Anesthesia Plan  ASA: III  Anesthesia Plan: General   Post-op Pain Management:    Induction: Intravenous  PONV Risk Score and Plan:   Airway Management Planned: Natural Airway and Mask  Additional Equipment:   Intra-op Plan:   Post-operative Plan:   Informed Consent: I have reviewed the patients History and Physical, chart, labs and discussed the procedure  including the risks, benefits and alternatives for the proposed anesthesia with the patient or authorized representative who has indicated his/her understanding and acceptance.   Dental Advisory Given  Plan Discussed with: Anesthesiologist, CRNA and Surgeon  Anesthesia Plan Comments: (Patient consented for risks of anesthesia including but not limited to:  - adverse reactions to medications - risk of intubation if required - damage to teeth, lips or other oral mucosa - sore throat or hoarseness - Damage to heart, brain, lungs or loss of life  Patient voiced understanding.)        Anesthesia Quick Evaluation

## 2017-03-30 NOTE — Progress Notes (Signed)
  [facility] Adult Case Management Discharge Plan :  Will you be returning to the same living situation after discharge:  Yes,  Pt will be returning home with his family At discharge, do you have transportation home?: Yes,  Pt's sister will be picking him up at discharge Do you have the ability to pay for your medications: Yes,  Pt has financial means to pay for his medications  Release of information consent forms completed and in the chart;  Patient's signature needed at discharge.  Patient to Follow up at: Follow-up Information    [facility] Follow up.   Why:  Assessments to establish care are Walk-In Only,  Monday-Friday from 9AM-4PM.  Please bring your insurance card and medication list with you. Contact information: [place] [id number] [phone]           Next level of care provider has access to [facility] and Suicide Prevention discussed: Yes,  No safety concerns reported by pt.  Have you used any form of tobacco in the last 30 days? (Cigarettes, Smokeless Tobacco, Cigars, and/or Pipes): Yes  Has patient been referred to the Quitline?: Patient refused referral  Patient has been referred for addiction treatment: Yes  [name], LCSW 03/30/2017, 12:18 PM

## 2017-03-30 NOTE — Transfer of Care (Signed)
Immediate Anesthesia Transfer of Care Note  Patient: Michael Schmitt  Procedure(s) Performed: ECT TX  Patient Location: PACU  Anesthesia Type:General  Level of Consciousness: sedated  Airway & Oxygen Therapy: Patient Spontanous Breathing and Patient connected to face mask oxygen  Post-op Assessment: Report given to RN and Post -op Vital signs reviewed and stable  Post vital signs: Reviewed and stable  Last Vitals:  Vitals:   03/30/17 0901 03/30/17 1117  BP: (!) 145/94 136/83  Pulse: 66 75  Resp: 18 18  Temp: 36.4 C 37.2 C  SpO2: 98% 96%    Last Pain:  Vitals:   03/30/17 0901  TempSrc: Oral  PainSc:       Patients Stated Pain Goal: 1 (09/90/68 9340)  Complications: No apparent anesthesia complications

## 2017-03-30 NOTE — Progress Notes (Signed)
[facility] MD Progress Note  03/30/2017 7:52 PM Michael Schmitt   MRN:  [id number] Subjective: Follow-up for this 47 year old man with long-standing severe depression and anxiety.  Patient was scheduled for a first ECT treatment today.  He had bilateral ECT this morning.  The treatment itself was without complication and he had a seizure of adequate length and quality.  However, before the beginning of the treatment the patient had a intense outburst of emotion in the preop area.  He began crying and screaming and striking himself on the head saying that he was still suicidal and did not want to leave the hospital today.  On follow-up this evening he is calmer but still says he is depressed and having suicidal ideation.  He feels a little bit jittery and has some neck pain but otherwise no new complaints. Principal Problem: Severe recurrent major depression without psychotic features ([place]) Diagnosis:   Patient Active Problem List   Diagnosis Date Noted  . Severe recurrent major depression without psychotic features ([place]) [F33.2] 03/12/2017  . Generalized anxiety disorder [F41.1] 02/05/2017  . Chronic neck pain (Primary Area of Pain) (Left) [M54.2, G89.29] 11/22/2016  . Cervical facet syndrome (Left) [D63.875] 11/22/2016  . Cervical radiculitis (Left) [M54.12] 11/22/2016  . History of cervical spinal surgery [Z98.890] 11/22/2016  . Cervical spondylosis [M47.22] 11/22/2016  . Chronic shoulder pain (Secondary Area of Pain) (Left) [M25.512, G89.29] 11/22/2016  . 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 Chronic low back pain [facility] Area of Pain) (Bilateral) (L>R) [M54.5, G89.29] 11/22/2016  . Lumbar facet syndrome (Bilateral) (L>R) [M47.816] 11/22/2016  . Chronic pain syndrome [G89.4] 11/21/2016  . Long term prescription benzodiazepine use [Z79.899] 11/21/2016  . Opiate use [F11.90] 11/21/2016  . Uncontrolled type 2 diabetes mellitus without complication, without long-term current use  of insulin ([place]) [E11.65] 10/19/2016  . Chronic Suicidal ideation [R45.851] 09/15/2016  . Coronary artery disease [I25.10] 09/12/2016  . HTN (hypertension) [I10] 06/26/2016  . GERD (gastroesophageal reflux disease) [K21.9] 06/26/2016  . Alcohol use disorder, severe, 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[place]) [F10.21] 06/26/2016  . Moderate benzodiazepine use disorder ([place]) [F13.20] 06/11/2016  . Sedative, hypnotic or anxiolytic use disorder, severe, dependence ([place]) [F13.20] 05/12/2016  . Ventricular fibrillation ([place]) [I49.01] 04/05/2016  . 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 Cervical spondylosis without myelopathy [M47.812] 07/24/2014  . Severe episode of recurrent major depressive disorder, without psychotic features ([place]) [F33.2] 04/15/2013  . Tobacco use disorder [F17.200] 10/20/2010  . Asthma [J45.909] 10/20/2010   Total Time spent with patient: 30 minutes  Past Psychiatric History: Patient has long-standing problems with mental health anxiety depression and substance abuse  Past Medical History:  Past Medical History:  Diagnosis Date  . Anxiety   . Anxiety   . Arthritis    cerv. stenosis, spondylosis, HNP- lower back , has been followed in pain clinic, has  had injection s in cerv. area  . Blood dyscrasia    told that when he was young he was a" free bleeder"  . CAD (coronary artery disease)   . Cervical spondylosis without myelopathy 07/24/2014  . Cervicogenic headache 07/24/2014  . Chronic kidney disease    renal calculi- passed spontaneously  . Depression   . Diabetes mellitus without complication ([place])   . Fatty liver   . GERD (gastroesophageal reflux disease)   . Headache(784.0)   . Hyperlipidemia   . Hypertension   . Mental disorder   . MI, old   . RLS (restless legs syndrome)    detected  on sleep study  . Shortness of breath   . Ventricular fibrillation ([place]) 04/05/2016    Past Surgical History:  Procedure Laterality Date  . ANTERIOR CERVICAL DECOMP/DISCECTOMY  FUSION  11/13/2011   Procedure: ANTERIOR CERVICAL DECOMPRESSION/DISCECTOMY FUSION 1 LEVEL;  Surgeon: [name], MD;  Location: [place] NEURO ORS;  Service: Neurosurgery;  Laterality: N/A;  Anterior Cervical Decompression/discectomy Fusion. Cervical three-four.  Marland Kitchen CARDIAC CATHETERIZATION N/A 01/01/2015   Procedure: Left Heart Cath and Coronary Angiography;  Surgeon: [name], MD;  Location: [place] CV LAB;  Service: Cardiovascular;  Laterality: N/A;  . CARDIAC CATHETERIZATION N/A 04/05/2016   Procedure: Left Heart Cath and Coronary Angiography;  Surgeon: Belva Crome, MD;  Location: [place] CV LAB;  Service: Cardiovascular;  Laterality: N/A;  . CARDIAC CATHETERIZATION N/A 04/05/2016   Procedure: Coronary Stent Intervention;  Surgeon: Belva Crome, MD;  Location: [place] CV LAB;  Service: Cardiovascular;  Laterality: N/A;  . CARDIAC CATHETERIZATION N/A 04/05/2016   Procedure: Intravascular Ultrasound/IVUS;  Surgeon: Belva Crome, MD;  Location: [place] CV LAB;  Service: Cardiovascular;  Laterality: N/A;  . NASAL SINUS SURGERY     2005   Family History:  Family History  Problem Relation Age of Onset  . Prostate cancer Father   . Hypertension Mother   . Kidney Stones Mother   . Anxiety disorder Mother   . Depression Mother   . COPD Sister   . Hypertension Sister   . Diabetes Sister   . Depression Sister   . Anxiety disorder Sister   . Seizures Sister   . ADD / ADHD Son   . ADD / ADHD Daughter    Family Psychiatric  History: Depression Social History:  Social History   Substance and Sexual Activity  Alcohol Use No  . Alcohol/week: 0.0 oz   Comment: last drinked in 3 months.      Social History   Substance and Sexual Activity  Drug Use No    Social History   Socioeconomic History  . Marital status: Widowed    Spouse name: None  . Number of children: 3  . Years of education: 52  . Highest education level: None  Social Needs  . Financial resource strain:  None  . Food insecurity - worry: None  . Food insecurity - inability: None  . Transportation needs - medical: None  . Transportation needs - non-medical: None  Occupational History    Comment: unemployed  Tobacco Use  . Smoking status: Current Every Day Smoker    Packs/day: 2.00    Years: 17.00    Pack years: 34.00    Types: Cigarettes, Cigars  . Smokeless tobacco: Former Systems developer  . Tobacco comment: occassional snuff   Substance and Sexual Activity  . Alcohol use: No    Alcohol/week: 0.0 oz    Comment: last drinked in 3 months.   . Drug use: No  . Sexual activity: Not Currently  Other Topics Concern  . None  Social History Narrative   Patient is right handed.   Patient drinks 2 sodas daily.   Additional Social History:                         Sleep: Fair  Appetite:  Fair  Current Medications: Current Facility-Administered Medications  Medication Dose Route Frequency Provider Last Rate Last Dose  . acetaminophen (TYLENOL) tablet 650 mg  650 mg Oral Q6H PRN Michi Herrmann, Madie Reno, MD  650 mg at 03/30/17 1216  . alum & mag hydroxide-simeth (MAALOX/MYLANTA) 200-200-20 MG/5ML suspension 30 mL  30 mL Oral Q4H PRN Isami Mehra T, MD      . aspirin EC tablet 81 mg  81 mg Oral Daily Jyquan Kenley T, MD   81 mg at 03/30/17 1212  . atorvastatin (LIPITOR) tablet 80 mg  80 mg Oral q1800 Tivon Lemoine, Madie Reno, MD   80 mg at 03/30/17 1732  . carvedilol (COREG) tablet 6.25 mg  6.25 mg Oral BID WC Breslin Hemann T, MD   6.25 mg at 03/30/17 1732  . clopidogrel (PLAVIX) tablet 75 mg  75 mg Oral Daily Redith Drach, Madie Reno, MD   75 mg at 03/30/17 1211  . escitalopram (LEXAPRO) tablet 20 mg  20 mg Oral Daily Ramond Dial, MD   20 mg at 03/30/17 1211  . hydrOXYzine (ATARAX/VISTARIL) tablet 50 mg  50 mg Oral TID PRN Marylin Crosby, MD   50 mg at 03/29/17 1038  . ibuprofen (ADVIL,MOTRIN) tablet 600 mg  600 mg Oral Q6H PRN McNew, Holly R, MD   600 mg at 03/30/17 1410  . lurasidone (LATUDA) tablet 60  mg  60 mg Oral Q breakfast Ramond Dial, MD   60 mg at 03/30/17 1211  . magnesium hydroxide (MILK OF MAGNESIA) suspension 30 mL  30 mL Oral Daily PRN Tyianna Menefee T, MD      . metFORMIN (GLUCOPHAGE) tablet 500 mg  500 mg Oral BID WC Kacy Conely, Madie Reno, MD   500 mg at 03/30/17 1732  . midazolam (VERSED) injection 2 mg  2 mg Intravenous Once Maelie Chriswell T, MD      . mirtazapine (REMERON) tablet 15 mg  15 mg Oral QHS McNew, Tyson Babinski, MD   15 mg at 03/29/17 2117  . multivitamin with minerals tablet 1 tablet  1 tablet Oral Daily McNew, Tyson Babinski, MD   1 tablet at 03/30/17 1212  . nicotine (NICODERM CQ - dosed in mg/24 hours) patch 21 mg  21 mg Transdermal Daily McNew, Tyson Babinski, MD   21 mg at 03/30/17 1212  . pneumococcal 23 valent vaccine (PNU-IMMUNE) injection 0.5 mL  0.5 mL Intramuscular Tomorrow-1000 McNew, Holly R, MD      . prazosin (MINIPRESS) capsule 2 mg  2 mg Oral QHS Verda Mehta, Madie Reno, MD   2 mg at 03/29/17 2117  . thiamine (VITAMIN B-1) tablet 100 mg  100 mg Oral Daily McNew, Tyson Babinski, MD   100 mg at 03/30/17 1211  . traZODone (DESYREL) tablet 100 mg  100 mg Oral QHS PRN Ramond Dial, MD   100 mg at 03/25/17 2009  . zolpidem (AMBIEN) tablet 10 mg  10 mg Oral QHS Ramond Dial, MD   10 mg at 03/29/17 2117    Lab Results:  Results for orders placed or performed during the hospital encounter of 03/12/17 (from the past 48 hour(s))  Glucose, capillary     Status: Abnormal   Collection Time: 03/30/17  6:15 AM  Result Value Ref Range   Glucose-Capillary 124 (H) 65 - 99 mg/dL   Comment 1 Notify RN   Glucose, capillary     Status: Abnormal   Collection Time: 03/30/17 11:32 AM  Result Value Ref Range   Glucose-Capillary 177 (H) 65 - 99 mg/dL    Blood Alcohol level:  Lab Results  Component Value Date   ETH 37 (H) 03/12/2017   ETH <10 02/58/5277    Metabolic Disorder Labs: Lab Results  Component Value Date   HGBA1C 6.0 (H) 03/13/2017   MPG 126 03/13/2017   MPG 177  09/12/2016   Lab Results  Component Value Date   PROLACTIN 18.1 (H) 09/12/2016   PROLACTIN 6.5 05/27/2016   Lab Results  Component Value Date   CHOL 118 03/13/2017   TRIG 325 (H) 03/13/2017   HDL 31 (L) 03/13/2017   CHOLHDL 3.8 03/13/2017   VLDL 65 (H) 03/13/2017   LDLCALC 22 03/13/2017   LDLCALC 37 09/12/2016    Physical Findings: AIMS: Facial and Oral Movements Muscles of Facial Expression: None, normal Lips and Perioral Area: None, normal Jaw: None, normal Tongue: None, normal,Extremity Movements Upper (arms, wrists, hands, fingers): None, normal Lower (legs, knees, ankles, toes): None, normal, Trunk Movements Neck, shoulders, hips: None, normal, Overall Severity Severity of abnormal movements (highest score from questions above): None, normal Incapacitation due to abnormal movements: None, normal Patient's awareness of abnormal movements (rate only patient's report): No Awareness, Dental Status Current problems with teeth and/or dentures?: No Does patient usually wear dentures?: No  CIWA:  CIWA-Ar Total: 0 COWS:  COWS Total Score: 2  Musculoskeletal: Strength & Muscle Tone: within normal limits Gait & Station: normal Patient leans: N/A  Psychiatric Specialty Exam: Physical Exam  Nursing note and vitals reviewed. Constitutional: He appears well-developed and well-nourished.  HENT:  Head: Normocephalic and atraumatic.  Eyes: Conjunctivae are normal. Pupils are equal, round, and reactive to light.  Neck: Normal range of motion.  Cardiovascular: Regular rhythm and normal heart sounds.  Respiratory: Effort normal. No respiratory distress.  GI: Soft.  Musculoskeletal: Normal range of motion.  Neurological: He is alert.  Skin: Skin is warm and dry.  Psychiatric: His mood appears anxious. His speech is delayed. He is slowed. Cognition and memory are normal. He expresses impulsivity. He exhibits a depressed mood. He expresses suicidal ideation. He expresses suicidal  plans.    Review of Systems  Constitutional: Negative.   HENT: Negative.   Eyes: Negative.   Respiratory: Negative.   Cardiovascular: Negative.   Gastrointestinal: Negative.   Musculoskeletal: Negative.   Skin: Negative.   Neurological: Negative.   Psychiatric/Behavioral: Positive for depression and suicidal ideas. Negative for hallucinations, memory loss and substance abuse. The patient is nervous/anxious and has insomnia.     Blood pressure 139/83, pulse 84, temperature 98.6 F (37 C), resp. rate (!) 21, height 6\' 2"  (1.88 m), weight 91.6 kg (202 lb), SpO2 93 %.Body mass index is 25.94 kg/m.  General Appearance: Casual  Eye Contact:  Good  Speech:  Slow  Volume:  Normal  Mood:  Depressed  Affect:  Congruent  Thought Process:  Coherent  Orientation:  Full (Time, Place, and Person)  Thought Content:  Logical  Suicidal Thoughts:  Yes.  with intent/plan  Homicidal Thoughts:  No  Memory:  Immediate;   Good Recent;   Fair Remote;   Fair  Judgement:  Fair  Insight:  Fair  Psychomotor Activity:  Decreased  Concentration:  Concentration: Fair  Recall:  [employer] of Knowledge:  Fair  Language:  Fair  Akathisia:  No  Handed:  Right  AIMS (if indicated):     Assets:  Desire for Improvement Housing Resilience  ADL's:  Intact  Cognition:  Impaired,  Mild  Sleep:  Number of Hours: 6.15     Treatment Plan Summary: Daily contact with patient to assess and evaluate symptoms and progress in treatment, Medication management and Plan Patient is now beginning ECT as a course  of treatment.  First treatment tolerated well.  Schedule is for 3 times a week treatment into next week.  No indication to change medicine.  Plan had been for discharge today but the patient had a outburst of saying he was suicidal which has delayed decision on discharge.  I talked with him this evening about it and encouraged him to think seriously about whether we could look forward to discharge after the  weekend.  Meanwhile no change to medicine next treatment Monday  Alethia Berthold, MD 03/30/2017, 7:52 PM

## 2017-03-30 NOTE — Plan of Care (Signed)
Patient oriented to unit. Patient denies SI/HI. Patient's safety is maintained on unit.  Progressing Self-Concept: Ability to disclose and discuss suicidal ideas will improve 03/30/2017 2015 - Progressing by Anson [place], RN Safety: Ability to disclose and discuss suicidal ideas will improve 03/30/2017 2015 - Progressing by Alyson Locket I, RN Spiritual Needs Ability to function at adequate level 03/30/2017 2015 - Progressing by Anson [place], RN

## 2017-03-30 NOTE — Progress Notes (Signed)
Patient ID: Michael Schmitt, male   DOB: 1970-06-30, 47 y.o.   MRN: [id number] Pre-ECT CBG=124, NPO since MN. A&Ox3

## 2017-03-30 NOTE — Progress Notes (Signed)
D: Patient has a flat affect, is sad in expression. Patient had ECT treatment on 03/30/17. Patient has no complaints and denies SI/HI/AVH. Patient stayed in room most of shift, was interacting with roommate.   A: Patient was assessed by this nurse. Patient was oriented to unit. Patient's safety was maintained on unit. Q x 15 minute observation checks were completed for safety. Patient care plan was reviewed. Patient was offered support and encouragement. Patient was encourage to attend groups, participate in unit activities and continue with plan of care.   R: Patient has no complaints of pain at this time. Patient is receptive to treatment and safety maintained on unit.

## 2017-03-30 NOTE — [facility] Group Notes (Signed)
[place] Group Notes:  (Nursing/MHT/Case Management/Adjunct)  Date:  03/30/2017  Time:  9:29 PM  Type of Therapy:  Group Therapy  Participation Level:  Did Not Attend  ParticipSummary of Progress/Problems:  Michael Schmitt 03/30/2017, 9:29 PM

## 2017-03-30 NOTE — Anesthesia Procedure Notes (Signed)
Date/Time: 03/30/2017 11:08 AM Performed by: Dionne Bucy, CRNA Pre-anesthesia Checklist: Patient identified, Emergency Drugs available, Suction available and Patient being monitored Patient Re-evaluated:Patient Re-evaluated prior to induction Oxygen Delivery Method: Circle system utilized Preoxygenation: Pre-oxygenation with 100% oxygen Induction Type: IV induction Ventilation: Mask ventilation without difficulty and Mask ventilation throughout procedure Airway Equipment and Method: Bite block Placement Confirmation: positive ETCO2 Dental Injury: Teeth and Oropharynx as per pre-operative assessment

## 2017-03-30 NOTE — [facility] Group Notes (Signed)
[place] Group Notes:  (Nursing/MHT/Case Management/Adjunct)  Date:  03/30/2017  Time:  5:09 PM  Type of Therapy:  Psychoeducational Skills  Participation Level:  Did Not Attend  Alois Cliche 03/30/2017, 5:09 PM

## 2017-03-30 NOTE — Plan of Care (Signed)
Patient slept for Estimated Hours of 6.15; Precautionary checks every 15 minutes for safety maintained, room free of safety hazards, patient sustains no injury or falls during this shift.

## 2017-03-30 NOTE — Progress Notes (Signed)
Recreation Therapy Notes  Date: 01.04.2019  Time: 1:00 PM  Location: Craft Room  Behavioral response: N/A  Intervention Topic: Communication  Discussion/Intervention: Patient did not attend group.   Clinical Observations/Feedback:  Patient did not attend group.  Yeimi Debnam LRT/CTRS         Jandy Brackens 03/30/2017 2:18 PM

## 2017-03-30 NOTE — Progress Notes (Signed)
Patient on stretcher with side rails up, waiting for procedure to start.  He had begun getting more and more anxious until he started yelling and beating himself in the head. Shouts that he just  Wants to hurt himself, wants to feel better, wishes he were dead.  States both that he doesn't want to be discharged home and that he wants to leave here.  At times trying to climb out of the stretcher.  [name] was called to the room to intervene with the patient, after speaking with him Michael Schmitt calmed some.  Staff remain at patient bedside to insure safety until transport to procedure.

## 2017-03-30 NOTE — Progress Notes (Signed)
Patient ID: Michael Schmitt, male   DOB: Nov 27, 1970, 47 y.o.   MRN: [id number] Quiet, cognizant, inconsistent with thoughts, apathetic, denied pain, A&Ox3, enjoying his stay with [place], LOS = 18. Anticipated course of care discussed, questions encouraged, answered, understanding verbalized: "my medications are not working, I have ECT tomorrow with [name]..." NPO past MN, Pre-ECT Anesthesia EKG obtained, will obtained pre-ECT CBG in am.

## 2017-03-30 NOTE — Procedures (Signed)
ECT SERVICES Physician's Interval Evaluation & Treatment Note  Patient Identification: Michael Schmitt MRN:  [id number] Date of Evaluation:  03/30/2017 TX #: 1  MADRS: 38  MMSE: 28  P.E. Findings:  No change to physical exam vital signs stable  Psychiatric Interval Note:  Very anxious.  Had a bit of a meltdown in the preop area but was able to be redirected.  Subjective:  Patient is a 47 y.o. male seen for evaluation for Electroconvulsive Therapy. Depressed and nervous and hopeless  Treatment Summary:   []   Right Unilateral             [x]  Bilateral   % Energy : 1.0 ms 50%   Impedance: 660 ohms  Seizure Energy Index: 6851 V squared  Postictal Suppression Index: 13%  Seizure Concordance Index: 76%  Medications  Pre Shock: Toradol 30 mg Brevital 100 mg succinylcholine 110 mg  Post Shock: Versed 4 mg Haldol 5 mg  Seizure Duration: 45 seconds by EMG 78 seconds by EEG   Comments: Not clear at this point whether he will be discharged home today we will have to reassess later  Lungs:  [x]   Clear to auscultation               []  Other:   Heart:    [x]   Regular rhythm             []  irregular rhythm    [x]   Previous H&P reviewed, patient examined and there are NO CHANGES                 []   Previous H&P reviewed, patient examined and there are changes noted.   Alethia Berthold, MD 1/4/201911:03 AM

## 2017-03-30 NOTE — Anesthesia Postprocedure Evaluation (Signed)
Anesthesia Post Note  Patient: Michael Schmitt  Procedure(s) Performed: ECT TX  Patient location during evaluation: PACU Anesthesia Type: General Level of consciousness: awake and alert Pain management: pain level controlled Vital Signs Assessment: post-procedure vital signs reviewed and stable Respiratory status: spontaneous breathing, nonlabored ventilation, respiratory function stable and patient connected to nasal cannula oxygen Cardiovascular status: blood pressure returned to baseline and stable Postop Assessment: no apparent nausea or vomiting Anesthetic complications: no     Last Vitals:  Vitals:   03/30/17 1134 03/30/17 1137  BP:  139/83  Pulse: 86 84  Resp:  (!) 21  Temp:  37 C  SpO2: 96% 93%    Last Pain:  Vitals:   03/30/17 1137  TempSrc:   PainSc: 0-No pain                 Precious Haws Piscitello

## 2017-03-30 NOTE — H&P (Signed)
Michael Schmitt is an 47 y.o. male.   Chief Complaint: Depressed, anxious, intermittently agitated. HPI: History of recurrent severe chronic depression and anxiety  Past Medical History:  Diagnosis Date  . Anxiety   . Anxiety   . Arthritis    cerv. stenosis, spondylosis, HNP- lower back , has been followed in pain clinic, has  had injection s in cerv. area  . Blood dyscrasia    told that when he was young he was a" free bleeder"  . CAD (coronary artery disease)   . Cervical spondylosis without myelopathy 07/24/2014  . Cervicogenic headache 07/24/2014  . Chronic kidney disease    renal calculi- passed spontaneously  . Depression   . Diabetes mellitus without complication ([place])   . Fatty liver   . GERD (gastroesophageal reflux disease)   . Headache(784.0)   . Hyperlipidemia   . Hypertension   . Mental disorder   . MI, old   . RLS (restless legs syndrome)    detected on sleep study  . Shortness of breath   . Ventricular fibrillation ([place]) 04/05/2016    Past Surgical History:  Procedure Laterality Date  . ANTERIOR CERVICAL DECOMP/DISCECTOMY FUSION  11/13/2011   Procedure: ANTERIOR CERVICAL DECOMPRESSION/DISCECTOMY FUSION 1 LEVEL;  Surgeon: [name], MD;  Location: [facility];  Service: Neurosurgery;  Laterality: N/A;  Anterior Cervical Decompression/discectomy Fusion. Cervical three-four.  Marland Kitchen CARDIAC CATHETERIZATION N/A 01/01/2015   Procedure: Left Heart Cath and Coronary Angiography;  Surgeon: [name], MD;  Location: [facility];  Service: Cardiovascular;  Laterality: N/A;  . CARDIAC CATHETERIZATION N/A 04/05/2016   Procedure: Left Heart Cath and Coronary Angiography;  Surgeon: Belva Crome, MD;  Location: [facility];  Service: Cardiovascular;  Laterality: N/A;  . CARDIAC CATHETERIZATION N/A 04/05/2016   Procedure: Coronary Stent Intervention;  Surgeon: Belva Crome, MD;  Location: [facility];  Service: Cardiovascular;  Laterality: N/A;  .  CARDIAC CATHETERIZATION N/A 04/05/2016   Procedure: Intravascular Ultrasound/IVUS;  Surgeon: Belva Crome, MD;  Location: [facility];  Service: Cardiovascular;  Laterality: N/A;  . NASAL SINUS SURGERY     2005    Family History  Problem Relation Age of Onset  . Prostate cancer Father   . Hypertension Mother   . Kidney Stones Mother   . Anxiety disorder Mother   . Depression Mother   . COPD Sister   . Hypertension Sister   . Diabetes Sister   . Depression Sister   . Anxiety disorder Sister   . Seizures Sister   . ADD / ADHD Son   . ADD / ADHD Daughter    Social History:  reports that he has been smoking cigarettes and cigars.  He has a 34.00 pack-year smoking history. He has quit using smokeless tobacco. He reports that he does not drink alcohol or use drugs.  Allergies:  Allergies  Allergen Reactions  . Prednisone Other (See Comments)    Hypertension, makes him feel spacey  . Hydrocodone Other (See Comments)    Headache, irritable  . Varenicline Other (See Comments)    Suicidal thoughts    Medications Prior to Admission  Medication Sig Dispense Refill  . aspirin EC 81 MG EC tablet Take 1 tablet (81 mg total) by mouth daily. 30 tablet 0  . atorvastatin (LIPITOR) 80 MG tablet Take 1 tablet (80 mg total) by mouth daily at 6 PM. 30 tablet 0  . carvedilol (COREG) 6.25 MG tablet  Take 1 tablet (6.25 mg total) by mouth 2 (two) times daily with a meal. 60 tablet 0  . clopidogrel (PLAVIX) 75 MG tablet Take 1 tablet (75 mg total) by mouth daily. 30 tablet 0  . divalproex (DEPAKOTE) 500 MG DR tablet Take 500 mg by mouth 2 (two) times daily.    Marland Kitchen gabapentin (NEURONTIN) 400 MG capsule Take 2 capsules (800 mg total) by mouth 4 (four) times daily. 240 capsule 0  . hydrOXYzine (ATARAX/VISTARIL) 50 MG tablet Take 50 mg by mouth 3 (three) times daily as needed.    . metFORMIN (GLUCOPHAGE) 500 MG tablet Take 1 tablet (500 mg total) by mouth 2 (two) times daily with a meal. 60 tablet 0   . mirtazapine (REMERON) 45 MG tablet Take 1 tablet (45 mg total) by mouth at bedtime. 30 tablet 0  . nitroGLYCERIN (NITROSTAT) 0.4 MG SL tablet Place 1 tablet (0.4 mg total) under the tongue every 5 (five) minutes x 3 doses as needed. 10 tablet 0  . prazosin (MINIPRESS) 2 MG capsule Take 2 mg by mouth at bedtime.    . risperiDONE (RISPERDAL) 2 MG tablet Take 2 mg by mouth at bedtime.    Marland Kitchen venlafaxine XR (EFFEXOR-XR) 75 MG 24 hr capsule Take 3 capsules (225 mg total) daily with breakfast by mouth. 90 capsule 1    Results for orders placed or performed during the hospital encounter of 03/12/17 (from the past 48 hour(s))  Glucose, capillary     Status: Abnormal   Collection Time: 03/30/17  6:15 AM  Result Value Ref Range   Glucose-Capillary 124 (H) 65 - 99 mg/dL   Comment 1 Notify RN    No results found.  Review of Systems  Constitutional: Negative.   HENT: Negative.   Eyes: Negative.   Respiratory: Negative.   Cardiovascular: Negative.   Gastrointestinal: Negative.   Musculoskeletal: Positive for back pain.  Skin: Negative.   Neurological: Negative.   Psychiatric/Behavioral: Positive for depression and suicidal ideas. Negative for hallucinations, memory loss and substance abuse. The patient is nervous/anxious. The patient does not have insomnia.     Blood pressure (!) 145/94, pulse 66, temperature 97.6 F (36.4 C), temperature source Oral, resp. rate 18, height 6\' 2"  (1.88 m), weight 202 lb (91.6 kg), SpO2 98 %. Physical Exam  Nursing note and vitals reviewed. Constitutional: He appears well-developed and well-nourished.  HENT:  Head: Normocephalic and atraumatic.  Eyes: Conjunctivae are normal. Pupils are equal, round, and reactive to light.  Neck: Normal range of motion.  Cardiovascular: Regular rhythm and normal heart sounds.  Respiratory: Effort normal and breath sounds normal. No respiratory distress.  GI: Soft.  Musculoskeletal: Normal range of motion.  Neurological:  He is alert.  Skin: Skin is warm and dry.  Psychiatric: His mood appears anxious. His affect is labile. His speech is delayed. He is slowed. Cognition and memory are normal. He expresses impulsivity and inappropriate judgment. He exhibits a depressed mood. He expresses suicidal ideation. He expresses no suicidal plans.     Assessment/Plan Treatment today.  We had tentatively planned for outpatient treatment next week but will have to reassess that this afternoon.  Alethia Berthold, MD 03/30/2017, 11:00 AM

## 2017-03-30 NOTE — Discharge Instructions (Addendum)
1)  The drugs that you have been given will stay in your system until tomorrow so for the       next 24 hours you should not:  A. Drive an automobile  B. Make any legal decisions  C. Drink any alcoholic beverages  2)  You may resume your regular meals upon return home.  3)  A responsible adult must take you home.  Someone should stay with you for a few          hours, then be available by phone for the remainder of the treatment day.  4)  You May experience any of the following symptoms:  Headache, Nausea and a dry mouth (due to the medications you were given),  temporary memory loss and some confusion, or sore muscles (a warm bath  should help this).  If you you experience any of these symptoms let us know on                your return visit.  5)  Report any of the following: any acute discomfort, severe headache, or temperature        greater than 100.5 F.   Also report any unusual redness, swelling, drainage, or pain         at your IV site.    You may report Symptoms to:  [facility]          Phone: [phone], ECT Department           or Dr. Prescott Gum office [phone]  6)  Your next ECT Treatment is Wednesday January 9 at 8:15  We will call 2 days prior to your scheduled appointment for arrival times.  7)  Nothing to eat or drink after midnight the night before your procedure.  8)  Take Coreg     With a sip of water the morning of your procedure.  9)  Other Instructions: Call [phone] to cancel the morning of your procedure due         to illness or emergency.  10) We will call within 72 hours to assess how you are feeling.

## 2017-03-30 NOTE — [facility] Group Notes (Signed)
  03/30/2017  Time: 0930  Type of Therapy and Topic:  Group Therapy:  Feelings around Relapse and Recovery  Participation Level:  Did Not Attend   Description of Group:    Patients in this group will discuss emotions they experience before and after a relapse. They will process how experiencing these feelings, or avoidance of experiencing them, relates to having a relapse. Facilitator will guide patients to explore emotions they have related to recovery. Patients will be encouraged to process which emotions are more powerful. They will be guided to discuss the emotional reaction significant others in their lives may have to their relapse or recovery. Patients will be assisted in exploring ways to respond to the emotions of others without this contributing to a relapse.  Therapeutic Goals: 1. Patient will identify two or more emotions that lead to a relapse for them 2. Patient will identify two emotions that result when they relapse 3. Patient will identify two emotions related to recovery 4. Patient will demonstrate ability to communicate their needs through discussion and/or role plays   Summary of Patient Progress: Pt was invited to attend group but chose not to attend. CSW will continue to encourage pt to attend group throughout their admission.      Therapeutic Modalities:   Cognitive Behavioral Therapy Solution-Focused Therapy Assertiveness Training Relapse Prevention Therapy  Alden Hipp, MSW, LCSW 03/30/2017 10:25 AM

## 2017-03-30 NOTE — Anesthesia Post-op Follow-up Note (Signed)
Anesthesia QCDR form completed.        

## 2017-03-30 NOTE — Plan of Care (Signed)
Patient had ECT today.  At this time does not notice any difference.  Questions how long it will take for him to feel better.  Voicing that he cannot remember the date or his own telephone number.  Medicated x 2 for headache with good results.  Support and encouragement offered.  Safety maintained on the unit.   Denies SI/HI/AVH.

## 2017-03-31 DIAGNOSIS — F17200 Nicotine dependence, unspecified, uncomplicated: Secondary | ICD-10-CM

## 2017-03-31 NOTE — Progress Notes (Addendum)
[facility] MD Progress Note  03/31/2017 4:34 PM Michael Schmitt   MRN:  [id number]   Subjective:  The patient reports that yesterday was not a day and he still feeling a little yellow and irritable. He had an outburst prior to ECT and then started voicing suicidal thoughts last night. This morning, he says he does not feel good and has had some intermittent passive suicidal thoughts but no active suicidal thoughts. Denies any current psychotic symptoms. The patient is still having a headache from ECT. Yesterday he had difficulty remembering cell phone number and date but couldn't report his cell phone number today. He cannot identify specific triggers for depression other than insomnia. He does not appear very worried about legal charges. The patient has been visible on the day room no behavioral disturbances. He says his sister and son have come to visit him. He did not attend groups yesterday.. Other than neck pain, he denies any other somatic complaints. He did sleep over 7 hours last night. Appetite is good.    Past Psychiatric History: Long history of depression.He has tried many medication trials with little improvement. He sees Dr. Kasandra Knudsen about monthly. He does not have a therapist. He reports history of suicide attempt by "putting gun to my head in the past." He reports at least 10 inpatient hospitalizations in the past year. Last was in [place]. HE states that hospitalizations have never been helpful for him.    Family Psychiatric History: The patients sister and mother had depression.   Social History: From [place] [employer]. Currently living with his mom, sister, and 2 kids. He is primary caregiver for his children ages 77 and 33. He also has a 107 year old kid. He has been on disability since 2015. He used to work as a Clinical cytogeneticist. He states that he does not have much of a support system.       Principal Problem: Severe recurrent major depression without psychotic features ([place]) Diagnosis:    Patient Active Problem List   Diagnosis Date Noted  . Severe recurrent major depression without psychotic features ([place]) [F33.2] 03/12/2017  . Generalized anxiety disorder [F41.1] 02/05/2017  . Chronic neck pain (Primary Area of Pain) (Left) [M54.2, G89.29] 11/22/2016  . Cervical facet syndrome (Left) [E42.353] 11/22/2016  . Cervical radiculitis (Left) [M54.12] 11/22/2016  . History of cervical spinal surgery [Z98.890] 11/22/2016  . Cervical spondylosis [M47.22] 11/22/2016  . Chronic shoulder pain (Secondary Area of Pain) (Left) [M25.512, G89.29] 11/22/2016  . 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 Chronic low back pain [facility] Area of Pain) (Bilateral) (L>R) [M54.5, G89.29] 11/22/2016  . Lumbar facet syndrome (Bilateral) (L>R) [M47.816] 11/22/2016  . Chronic pain syndrome [G89.4] 11/21/2016  . Long term prescription benzodiazepine use [Z79.899] 11/21/2016  . Opiate use [F11.90] 11/21/2016  . Uncontrolled type 2 diabetes mellitus without complication, without long-term current use of insulin ([place]) [E11.65] 10/19/2016  . Chronic Suicidal ideation [R45.851] 09/15/2016  . Coronary artery disease [I25.10] 09/12/2016  . HTN (hypertension) [I10] 06/26/2016  . GERD (gastroesophageal reflux disease) [K21.9] 06/26/2016  . Alcohol use disorder, severe, 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[place]) [F10.21] 06/26/2016  . Moderate benzodiazepine use disorder ([place]) [F13.20] 06/11/2016  . Sedative, hypnotic or anxiolytic use disorder, severe, dependence ([place]) [F13.20] 05/12/2016  . Ventricular fibrillation ([place]) [I49.01] 04/05/2016  . 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 Cervical spondylosis without myelopathy [M47.812] 07/24/2014  . Severe episode of recurrent major depressive disorder, without psychotic features ([place]) [F33.2] 04/15/2013  . Tobacco use disorder [F17.200]  10/20/2010  . Asthma [J45.909] 10/20/2010   Total Time spent with patient: 30 minutes  Past Psychiatric History:  Patient has long-standing problems with mental health anxiety depression and substance abuse  Past Medical History:  Past Medical History:  Diagnosis Date  . Anxiety   . Anxiety   . Arthritis    cerv. stenosis, spondylosis, HNP- lower back , has been followed in pain clinic, has  had injection s in cerv. area  . Blood dyscrasia    told that when he was young he was a" free bleeder"  . CAD (coronary artery disease)   . Cervical spondylosis without myelopathy 07/24/2014  . Cervicogenic headache 07/24/2014  . Chronic kidney disease    renal calculi- passed spontaneously  . Depression   . Diabetes mellitus without complication ([place])   . Fatty liver   . GERD (gastroesophageal reflux disease)   . Headache(784.0)   . Hyperlipidemia   . Hypertension   . Mental disorder   . MI, old   . RLS (restless legs syndrome)    detected on sleep study  . Shortness of breath   . Ventricular fibrillation ([place]) 04/05/2016    Past Surgical History:  Procedure Laterality Date  . ANTERIOR CERVICAL DECOMP/DISCECTOMY FUSION  11/13/2011   Procedure: ANTERIOR CERVICAL DECOMPRESSION/DISCECTOMY FUSION 1 LEVEL;  Surgeon: [name], MD;  Location: [place] [facility]O ORS;  Service: Neurosurgery;  Laterality: N/A;  Anterior Cervical Decompression/discectomy Fusion. Cervical three-four.  Marland Kitchen CARDIAC CATHETERIZATION N/A 01/01/2015   Procedure: Left Heart Cath and Coronary Angiography;  Surgeon: [name], MD;  Location: [place] CV LAB;  Service: Cardiovascular;  Laterality: N/A;  . CARDIAC CATHETERIZATION N/A 04/05/2016   Procedure: Left Heart Cath and Coronary Angiography;  Surgeon: Belva Crome, MD;  Location: [place] CV LAB;  Service: Cardiovascular;  Laterality: N/A;  . CARDIAC CATHETERIZATION N/A 04/05/2016   Procedure: Coronary Stent Intervention;  Surgeon: Belva Crome, MD;  Location: [place] CV LAB;  Service: Cardiovascular;  Laterality: N/A;  . CARDIAC CATHETERIZATION N/A 04/05/2016   Procedure:  Intravascular Ultrasound/IVUS;  Surgeon: Belva Crome, MD;  Location: [place] CV LAB;  Service: Cardiovascular;  Laterality: N/A;  . NASAL SINUS SURGERY     2005   Family History:  Family History  Problem Relation Age of Onset  . Prostate cancer Father   . Hypertension Mother   . Kidney Stones Mother   . Anxiety disorder Mother   . Depression Mother   . COPD Sister   . Hypertension Sister   . Diabetes Sister   . Depression Sister   . Anxiety disorder Sister   . Seizures Sister   . ADD / ADHD Son   . ADD / ADHD Daughter    Family Psychiatric  History: Depression Social History:  Social History   Substance and Sexual Activity  Alcohol Use No  . Alcohol/week: 0.0 oz   Comment: last drinked in 3 months.      Social History   Substance and Sexual Activity  Drug Use No    Social History   Socioeconomic History  . Marital status: Widowed    Spouse name: None  . Number of children: 3  . Years of education: 76  . Highest education level: None  Social Needs  . Financial resource strain: None  . Food insecurity - worry: None  . Food insecurity - inability: None  . Transportation needs - medical: None  . Transportation needs - non-medical: None  Occupational History  Comment: unemployed  Tobacco Use  . Smoking status: Current Every Day Smoker    Packs/day: 2.00    Years: 17.00    Pack years: 34.00    Types: Cigarettes, Cigars  . Smokeless tobacco: Former Systems developer  . Tobacco comment: occassional snuff   Substance and Sexual Activity  . Alcohol use: No    Alcohol/week: 0.0 oz    Comment: last drinked in 3 months.   . Drug use: No  . Sexual activity: Not Currently  Other Topics Concern  . None  Social History Narrative   Patient is right handed.   Patient drinks 2 sodas daily.   Additional Social History:                         Sleep: Fair  Appetite:  Fair  Current Medications: Current Facility-Administered Medications  Medication Dose  Route Frequency Provider Last Rate Last Dose  . acetaminophen (TYLENOL) tablet 650 mg  650 mg Oral Q6H PRN Clapacs, Madie Reno, MD   650 mg at 03/31/17 0834  . alum & mag hydroxide-simeth (MAALOX/MYLANTA) 200-200-20 MG/5ML suspension 30 mL  30 mL Oral Q4H PRN Clapacs, John T, MD      . aspirin EC tablet 81 mg  81 mg Oral Daily Clapacs, Madie Reno, MD   81 mg at 03/31/17 0834  . atorvastatin (LIPITOR) tablet 80 mg  80 mg Oral q1800 Clapacs, Madie Reno, MD   80 mg at 03/30/17 1732  . carvedilol (COREG) tablet 6.25 mg  6.25 mg Oral BID WC Clapacs, Madie Reno, MD   6.25 mg at 03/31/17 4403  . clopidogrel (PLAVIX) tablet 75 mg  75 mg Oral Daily Clapacs, Madie Reno, MD   75 mg at 03/31/17 0834  . escitalopram (LEXAPRO) tablet 20 mg  20 mg Oral Daily Ramond Dial, MD   20 mg at 03/31/17 0834  . hydrOXYzine (ATARAX/VISTARIL) tablet 50 mg  50 mg Oral TID PRN Marylin Crosby, MD   50 mg at 03/31/17 0839  . ibuprofen (ADVIL,MOTRIN) tablet 600 mg  600 mg Oral Q6H PRN Marylin Crosby, MD   600 mg at 03/31/17 1518  . lurasidone (LATUDA) tablet 60 mg  60 mg Oral Q breakfast Ramond Dial, MD   60 mg at 03/31/17 0835  . magnesium hydroxide (MILK OF MAGNESIA) suspension 30 mL  30 mL Oral Daily PRN Clapacs, John T, MD      . metFORMIN (GLUCOPHAGE) tablet 500 mg  500 mg Oral BID WC Clapacs, Madie Reno, MD   500 mg at 03/31/17 4742  . midazolam (VERSED) injection 2 mg  2 mg Intravenous Once Clapacs, John T, MD      . mirtazapine (REMERON) tablet 15 mg  15 mg Oral QHS McNew, Tyson Babinski, MD   15 mg at 03/30/17 2102  . multivitamin with minerals tablet 1 tablet  1 tablet Oral Daily McNew, Tyson Babinski, MD   1 tablet at 03/31/17 [phone]  . nicotine (NICODERM CQ - dosed in mg/24 hours) patch 21 mg  21 mg Transdermal Daily McNew, Tyson Babinski, MD   21 mg at 03/31/17 0834  . pneumococcal 23 valent vaccine (PNU-IMMUNE) injection 0.5 mL  0.5 mL Intramuscular Tomorrow-1000 McNew, Holly R, MD      . prazosin (MINIPRESS) capsule 2 mg  2 mg Oral QHS Clapacs,  Madie Reno, MD   2 mg at 03/30/17 2102  . thiamine (VITAMIN B-1) tablet 100 mg  100 mg Oral  Daily McNew, Tyson Babinski, MD   100 mg at 03/31/17 0834  . traZODone (DESYREL) tablet 100 mg  100 mg Oral QHS PRN Ramond Dial, MD   100 mg at 03/25/17 2009  . zolpidem (AMBIEN) tablet 10 mg  10 mg Oral QHS Ramond Dial, MD   10 mg at 03/30/17 2102    Lab Results:  Results for orders placed or performed during the hospital encounter of 03/12/17 (from the past 48 hour(s))  Glucose, capillary     Status: Abnormal   Collection Time: 03/30/17  6:15 AM  Result Value Ref Range   Glucose-Capillary 124 (H) 65 - 99 mg/dL   Comment 1 Notify RN   Glucose, capillary     Status: Abnormal   Collection Time: 03/30/17 11:32 AM  Result Value Ref Range   Glucose-Capillary 177 (H) 65 - 99 mg/dL    Blood Alcohol level:  Lab Results  Component Value Date   ETH 37 (H) 03/12/2017   ETH <10 94/70/9628    Metabolic Disorder Labs: Lab Results  Component Value Date   HGBA1C 6.0 (H) 03/13/2017   MPG 126 03/13/2017   MPG 177 09/12/2016   Lab Results  Component Value Date   PROLACTIN 18.1 (H) 09/12/2016   PROLACTIN 6.5 05/27/2016   Lab Results  Component Value Date   CHOL 118 03/13/2017   TRIG 325 (H) 03/13/2017   HDL 31 (L) 03/13/2017   CHOLHDL 3.8 03/13/2017   VLDL 65 (H) 03/13/2017   LDLCALC 22 03/13/2017   LDLCALC 37 09/12/2016    Physical Findings: AIMS: Facial and Oral Movements Muscles of Facial Expression: None, normal Lips and Perioral Area: None, normal Jaw: None, normal Tongue: None, normal,Extremity Movements Upper (arms, wrists, hands, fingers): None, normal Lower (legs, knees, ankles, toes): None, normal, Trunk Movements Neck, shoulders, hips: None, normal, Overall Severity Severity of abnormal movements (highest score from questions above): None, normal Incapacitation due to abnormal movements: None, normal Patient's awareness of abnormal movements (rate only patient's report):  No Awareness, Dental Status Current problems with teeth and/or dentures?: No Does patient usually wear dentures?: No  CIWA:  CIWA-Ar Total: 0 COWS:  COWS Total Score: 2  Musculoskeletal: Strength & Muscle Tone: within normal limits Gait & Station: normal Patient leans: N/A  Psychiatric Specialty Exam: Physical Exam  Nursing note and vitals reviewed. Constitutional: He appears well-developed and well-nourished.  HENT:  Head: Normocephalic and atraumatic.  Eyes: Conjunctivae are normal. Pupils are equal, round, and reactive to light.  Neck: Normal range of motion.  Cardiovascular: Regular rhythm and normal heart sounds.  Respiratory: Effort normal. No respiratory distress.  GI: Soft.  Musculoskeletal: Normal range of motion.  Neurological: He is alert.  Skin: Skin is warm and dry.  Psychiatric: His mood appears anxious. His speech is delayed. He is slowed. Cognition and memory are normal. He expresses impulsivity. He exhibits a depressed mood. He expresses suicidal ideation. He expresses suicidal plans.    Review of Systems  Constitutional: Negative.   HENT: Negative.   Eyes: Negative.   Respiratory: Negative.   Cardiovascular: Negative.   Gastrointestinal: Negative.   Musculoskeletal: Negative.   Skin: Negative.   Neurological: Negative.   Psychiatric/Behavioral: Positive for depression and suicidal ideas. Negative for hallucinations, memory loss and substance abuse. The patient is nervous/anxious and has insomnia.     Blood pressure 130/86, pulse 77, temperature 97.7 F (36.5 C), temperature source Oral, resp. rate 20, height 6\' 2"  (1.88 m), weight 91.6 kg (202 lb), SpO2  99 %.Body mass index is 25.94 kg/m.  General Appearance: Casual  Eye Contact:  Good  Speech:  Slow  Volume:  Normal  Mood:  Depressed  Affect:  Congruent  Thought Process:  Coherent  Orientation:  Full (Time, Place, and Person)  Thought Content:  Logical  Suicidal Thoughts:  Yes.  with intent/plan   Homicidal Thoughts:  No  Memory:  Immediate;   Good Recent;   Fair Remote;   Fair  Judgement:  Fair  Insight:  Fair  Psychomotor Activity:  Decreased  Concentration:  Concentration: Fair  Recall:  [employer] of Knowledge:  Fair  Language:  Fair  Akathisia:  No  Handed:  Right  AIMS (if indicated):     Assets:  Desire for Improvement Housing Resilience  ADL's:  Intact  Cognition:  Impaired,  Mild  Sleep:  Number of Hours: 7.25     Treatment Plan Summary:  Treatment Plan Summary: 47 yo male with history of multiple admission, chronic depression and SI admitted due to SI and insomnia. Pt's main focus is insomnia and reports this has caused worsening depression. He has upcoming court hearing for DUI and needs to show the judge that he is making an effort for treatment. He is requesting residential or outpatient alcohol treatment. He reports chronic SI "everyday" for several years. He states that he will not act on it because of his children. He has had at least 10 hospitalization in the past year and has not had much benefit from that. He has poor response to medications. He does not have a daily routine and does not get out of the house which is likely contributing to insomnia (he sleeps in living room and hangs out there all day) and depression. HE also does not see a therapist which would be of benefit for him. Will try to help him get some sleep while in the hospital since this is a primary concern for him.  Plan:  Mood disorder -Restart home medications of Latuda now at 60mg  po daily, Remeron 15mg  po nightkly and Lexapro 20mg  podaily. He is no longer on Effexor or Depakote  -He started ECT treatment on Friday and so far is tolerating is well. He is scheduled for second treatment tomorrow (04/02/17) -Hemoglobin A1c was 6.0 total cholesterol was 118. -EKG showed a QTc of 440   Insomnia -Continue trazodone 100 mg qhs -Ambien 5 mg qhs. He is aware that he will not be prescribed  this outside of the hospital.   Alcohol abuse -No current withdrawal symptoms -The patient was advised to abstain from alcohol as it may worsen mood symptoms. -He will be encouraged to engage in a meaningful recovery program at discharge.-  HTN -Vital signs stable -Carvedilol 6.25 mg BID -Continue aspirin 81 mg by mouth daily and Plavix 75 mg by mouth daily.  Hyperlipidemia -Continue Lipitpr 80mg  po daily  DM -Continue Metformin 1000 milligrams by mouth twice a day -We'll her blood sugars   the patient will maintain on a carbohydrate limited diet  Disposition: -The patient has a stable living situation. -He will need psychotropic medication management follow-up appointment at the time of discharge.    Chauncey Mann, MD 03/31/2017, 4:34 PM

## 2017-03-31 NOTE — Progress Notes (Signed)
D- Patient alert and oriented. Patient presents in a depressed mood on assessment rating his depression a "10/10", but patient doesn't know what is making him depressed. Patient also endorses an anxiety level at a "10/10", and when this writer asked him what is making him anxious, patient states "I don't know". Patient denies SI, HI, AVH, at this time.   A- Scheduled medications administered to patient, per MD orders. Support and encouragement provided.  Routine safety checks conducted every 15 minutes.  Patient informed to notify staff with problems or concerns.  R- No adverse drug reactions noted. Patient contracts for safety at this time. Patient compliant with medications and treatment plan. Patient receptive, calm, and cooperative. Patient interacts well with others on the unit.  Patient remains safe at this time.

## 2017-03-31 NOTE — [facility] Group Notes (Signed)
LCSW Group Therapy Note   03/31/2017 1:15pm   Type of Therapy and Topic:  Group Therapy:  Trust and Honesty  Participation Level:  Minimal  Description of Group:    In this group patients will be asked to explore the value of being honest.  Patients will be guided to discuss their thoughts, feelings, and behaviors related to honesty and trusting in others. Patients will process together how trust and honesty relate to forming relationships with peers, family members, and self. Each patient will be challenged to identify and express feelings of being vulnerable. Patients will discuss reasons why people are dishonest and identify alternative outcomes if one was truthful (to self or others). This group will be process-oriented, with patients participating in exploration of their own experiences, giving and receiving support, and processing challenge from other group members.   Therapeutic Goals: 1. Patient will identify why honesty is important to relationships and how honesty overall affects relationships.  2. Patient will identify a situation where they lied or were lied too and the  feelings, thought process, and behaviors surrounding the situation 3. Patient will identify the meaning of being vulnerable, how that feels, and how that correlates to being honest with self and others. 4. Patient will identify situations where they could have told the truth, but instead lied and explain reasons of dishonesty.   Summary of Patient Progress; Pt reports that he is speechless.     Therapeutic Modalities:   Cognitive Behavioral Therapy Solution Focused Therapy Motivational Interviewing Brief Therapy  Marzelle Rutten  CUEBAS-COLON, LCSW 03/31/2017 9:38 AM

## 2017-04-01 ENCOUNTER — Other Ambulatory Visit: Payer: Self-pay | Admitting: Psychiatry

## 2017-04-01 LAB — GLUCOSE, CAPILLARY
Glucose-Capillary: 130 mg/dL — ABNORMAL HIGH (ref 65–99)
Glucose-Capillary: 90 mg/dL (ref 65–99)

## 2017-04-01 NOTE — Progress Notes (Signed)
Patient ID: Michael Schmitt, male   DOB: January 09, 1971, 47 y.o.   MRN: [id number]   D: Pt has been very irritable today on the unit, he spent most of the day pacing the halls. Pt started the morning off complaining of some pain and anxiety, medication was given he reported that the medication helped. Pt reported that he remained very depressed. Pt reported being negative SI/HI, no AH/VH noted. Pt took all medication without any problems. A: 15 min checks continued for patient safety. R: Pt safety maintained.

## 2017-04-01 NOTE — Plan of Care (Signed)
  Progressing Education: Knowledge of the prescribed therapeutic regimen will improve 04/01/2017 2349 - Progressing by Derek Mound, RN Health Behavior/Discharge Planning: Compliance with therapeutic regimen will improve 04/01/2017 2349 - Progressing by Derek Mound, RN

## 2017-04-01 NOTE — [facility] Group Notes (Signed)
[facility] Group Notes:  (Nursing/MHT/Case Management/Adjunct)  Date:  04/01/2017  Time:  11:14 PM  Type of Therapy:  Group Therapy  Participation Level:  Active  Participation Quality:  Appropriate  Affect:  Appropriate  Cognitive:  Appropriate  Insight:  Appropriate  Engagement in Group:  Engaged  Modes of Intervention:  Discussion  Summary of Progress/Problems:  Kandis Fantasia 04/01/2017, 11:14 PM

## 2017-04-01 NOTE — Progress Notes (Signed)
[facility] MD Progress Note  04/01/2017 11:35 AM Michael Schmitt   MRN:  [id number]   Subjective: The patient is reporting some passive suicidal thoughts still and says he is "tired of life". He says he feels confused about life and depressed about the fact that he cannot drive. The patient says his driver's license was taken away from him for 4 years secondary to having 2 DUIs. He cannot take his children anywhere as a result. He rated his depression and anxiety as a 10 out of 10. He denies any active suicidal thoughts and is able to contract for safety on the unit. He is also agreeable to continuing treatment tomorrow. He denies any current psychotic symptoms. He has been attending groups with participation is minimal. He is sleeping fairly well and denies any problems with his appetite. No somatic complaints.  After further discussion, the patient did report that he does have a lot of "idle time at work" and wants to return to working. He worked as a Development worker, community in the past. Supportive psychotherapy provided and Times spent discussing coping skills and alternative activities himself busy. He was encouraged to at least do some volunteer work.  Past Psychiatric History: Long history of depression.He has tried many medication trials with little improvement. He sees Dr. Kasandra Knudsen about monthly. He does not have a therapist. He reports history of suicide attempt by "putting gun to my head in the past." He reports at least 10 inpatient hospitalizations in the past year. Last was in [place]. HE states that hospitalizations have never been helpful for him.    Family Psychiatric History: The patients sister and mother had depression.   Social History: From [employer]. Currently living with his mom, sister, and 2 kids. He is primary caregiver for his children ages 25 and 55. He also has a 74 year old kid. He has been on disability since 2015. He used to work as a Clinical cytogeneticist. He states that he does not have much of a  support system.      Principal Problem: Severe recurrent major depression without psychotic features ([place]) Diagnosis:   Patient Active Problem List   Diagnosis Date Noted  . Severe recurrent major depression without psychotic features ([place]) [F33.2] 03/12/2017  . Generalized anxiety disorder [F41.1] 02/05/2017  . Chronic neck pain (Primary Area of Pain) (Left) [M54.2, G89.29] 11/22/2016  . Cervical facet syndrome (Left) [W54.627] 11/22/2016  . Cervical radiculitis (Left) [M54.12] 11/22/2016  . History of cervical spinal surgery [Z98.890] 11/22/2016  . Cervical spondylosis [M47.22] 11/22/2016  . Chronic shoulder pain (Secondary Area of Pain) (Left) [M25.512, G89.29] 11/22/2016  . 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 Chronic low back pain [facility] Area of Pain) (Bilateral) (L>R) [M54.5, G89.29] 11/22/2016  . Lumbar facet syndrome (Bilateral) (L>R) [M47.816] 11/22/2016  . Chronic pain syndrome [G89.4] 11/21/2016  . Long term prescription benzodiazepine use [Z79.899] 11/21/2016  . Opiate use [F11.90] 11/21/2016  . Uncontrolled type 2 diabetes mellitus without complication, without long-term current use of insulin ([place]) [E11.65] 10/19/2016  . Chronic Suicidal ideation [R45.851] 09/15/2016  . Coronary artery disease [I25.10] 09/12/2016  . HTN (hypertension) [I10] 06/26/2016  . GERD (gastroesophageal reflux disease) [K21.9] 06/26/2016  . Alcohol use disorder, severe, 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[place]) [F10.21] 06/26/2016  . Moderate benzodiazepine use disorder ([place]) [F13.20] 06/11/2016  . Sedative, hypnotic or anxiolytic use disorder, severe, dependence ([place]) [F13.20] 05/12/2016  . Ventricular fibrillation ([place]) [I49.01] 04/05/2016  . 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 Cervical spondylosis without myelopathy [  M47.812] 07/24/2014  . Severe episode of recurrent major depressive disorder, without psychotic features ([place]) [F33.2] 04/15/2013  . Tobacco use disorder [F17.200]  10/20/2010  . Asthma [J45.909] 10/20/2010   Total Time spent with patient: 30 minutes  Past Psychiatric History: Patient has long-standing problems with mental health anxiety depression and substance abuse  Past Medical History:  Past Medical History:  Diagnosis Date  . Anxiety   . Anxiety   . Arthritis    cerv. stenosis, spondylosis, HNP- lower back , has been followed in pain clinic, has  had injection s in cerv. area  . Blood dyscrasia    told that when he was young he was a" free bleeder"  . CAD (coronary artery disease)   . Cervical spondylosis without myelopathy 07/24/2014  . Cervicogenic headache 07/24/2014  . Chronic kidney disease    renal calculi- passed spontaneously  . Depression   . Diabetes mellitus without complication ([place])   . Fatty liver   . GERD (gastroesophageal reflux disease)   . Headache(784.0)   . Hyperlipidemia   . Hypertension   . Mental disorder   . MI, old   . RLS (restless legs syndrome)    detected on sleep study  . Shortness of breath   . Ventricular fibrillation ([place]) 04/05/2016    Past Surgical History:  Procedure Laterality Date  . ANTERIOR CERVICAL DECOMP/DISCECTOMY FUSION  11/13/2011   Procedure: ANTERIOR CERVICAL DECOMPRESSION/DISCECTOMY FUSION 1 LEVEL;  Surgeon: [name], MD;  Location: [facility];  Service: Neurosurgery;  Laterality: N/A;  Anterior Cervical Decompression/discectomy Fusion. Cervical three-four.  Marland Kitchen CARDIAC CATHETERIZATION N/A 01/01/2015   Procedure: Left Heart Cath and Coronary Angiography;  Surgeon: [name], MD;  Location: [facility];  Service: Cardiovascular;  Laterality: N/A;  . CARDIAC CATHETERIZATION N/A 04/05/2016   Procedure: Left Heart Cath and Coronary Angiography;  Surgeon: Belva Crome, MD;  Location: [facility];  Service: Cardiovascular;  Laterality: N/A;  . CARDIAC CATHETERIZATION N/A 04/05/2016   Procedure: Coronary Stent Intervention;  Surgeon: Belva Crome, MD;  Location: [facility];  Service: Cardiovascular;  Laterality: N/A;  . CARDIAC CATHETERIZATION N/A 04/05/2016   Procedure: Intravascular Ultrasound/IVUS;  Surgeon: Belva Crome, MD;  Location: [facility];  Service: Cardiovascular;  Laterality: N/A;  . NASAL SINUS SURGERY     2005   Family History:  Family History  Problem Relation Age of Onset  . Prostate cancer Father   . Hypertension Mother   . Kidney Stones Mother   . Anxiety disorder Mother   . Depression Mother   . COPD Sister   . Hypertension Sister   . Diabetes Sister   . Depression Sister   . Anxiety disorder Sister   . Seizures Sister   . ADD / ADHD Son   . ADD / ADHD Daughter    Family Psychiatric  History: Depression Social History:  Social History   Substance and Sexual Activity  Alcohol Use No  . Alcohol/week: 0.0 oz   Comment: last drinked in 3 months.      Social History   Substance and Sexual Activity  Drug Use No    Social History   Socioeconomic History  . Marital status: Widowed    Spouse name: None  . Number of children: 3  . Years of education: 26  . Highest education level: None  Social Needs  . Financial resource strain: None  . Food insecurity - worry: None  .  Food insecurity - inability: None  . Transportation needs - medical: None  . Transportation needs - non-medical: None  Occupational History    Comment: unemployed  Tobacco Use  . Smoking status: Current Every Day Smoker    Packs/day: 2.00    Years: 17.00    Pack years: 34.00    Types: Cigarettes, Cigars  . Smokeless tobacco: Former Systems developer  . Tobacco comment: occassional snuff   Substance and Sexual Activity  . Alcohol use: No    Alcohol/week: 0.0 oz    Comment: last drinked in 3 months.   . Drug use: No  . Sexual activity: Not Currently  Other Topics Concern  . None  Social History Narrative   Patient is right handed.   Patient drinks 2 sodas daily.   Additional Social History:                          Sleep: Fair  Appetite:  Fair  Current Medications: Current Facility-Administered Medications  Medication Dose Route Frequency Provider Last Rate Last Dose  . acetaminophen (TYLENOL) tablet 650 mg  650 mg Oral Q6H PRN Clapacs, Madie Reno, MD   650 mg at 03/31/17 0834  . alum & mag hydroxide-simeth (MAALOX/MYLANTA) 200-200-20 MG/5ML suspension 30 mL  30 mL Oral Q4H PRN Clapacs, John T, MD      . aspirin EC tablet 81 mg  81 mg Oral Daily Clapacs, Madie Reno, MD   81 mg at 04/01/17 0815  . atorvastatin (LIPITOR) tablet 80 mg  80 mg Oral q1800 Clapacs, Madie Reno, MD   80 mg at 03/31/17 1702  . carvedilol (COREG) tablet 6.25 mg  6.25 mg Oral BID WC Clapacs, Madie Reno, MD   6.25 mg at 04/01/17 0815  . clopidogrel (PLAVIX) tablet 75 mg  75 mg Oral Daily Clapacs, Madie Reno, MD   75 mg at 04/01/17 0815  . escitalopram (LEXAPRO) tablet 20 mg  20 mg Oral Daily Ramond Dial, MD   20 mg at 04/01/17 0815  . hydrOXYzine (ATARAX/VISTARIL) tablet 50 mg  50 mg Oral TID PRN Marylin Crosby, MD   50 mg at 04/01/17 0815  . ibuprofen (ADVIL,MOTRIN) tablet 600 mg  600 mg Oral Q6H PRN McNew, Tyson Babinski, MD   600 mg at 04/01/17 0815  . lurasidone (LATUDA) tablet 60 mg  60 mg Oral Q breakfast Ramond Dial, MD   60 mg at 04/01/17 0815  . magnesium hydroxide (MILK OF MAGNESIA) suspension 30 mL  30 mL Oral Daily PRN Clapacs, John T, MD      . metFORMIN (GLUCOPHAGE) tablet 500 mg  500 mg Oral BID WC Clapacs, Madie Reno, MD   500 mg at 04/01/17 0815  . mirtazapine (REMERON) tablet 15 mg  15 mg Oral QHS Marylin Crosby, MD   15 mg at 03/31/17 2114  . multivitamin with minerals tablet 1 tablet  1 tablet Oral Daily McNew, Tyson Babinski, MD   1 tablet at 04/01/17 0815  . nicotine (NICODERM CQ - dosed in mg/24 hours) patch 21 mg  21 mg Transdermal Daily McNew, Tyson Babinski, MD   21 mg at 04/01/17 0815  . pneumococcal 23 valent vaccine (PNU-IMMUNE) injection 0.5 mL  0.5 mL Intramuscular Tomorrow-1000 McNew, Holly R, MD      . prazosin (MINIPRESS)  capsule 2 mg  2 mg Oral QHS Clapacs, Madie Reno, MD   2 mg at 03/31/17 2114  . thiamine (VITAMIN B-1) tablet 100  mg  100 mg Oral Daily McNew, Tyson Babinski, MD   100 mg at 04/01/17 0815  . traZODone (DESYREL) tablet 100 mg  100 mg Oral QHS PRN Ramond Dial, MD   100 mg at 03/31/17 2114  . zolpidem (AMBIEN) tablet 10 mg  10 mg Oral QHS Ramond Dial, MD   10 mg at 03/31/17 2124    Lab Results:  Results for orders placed or performed during the hospital encounter of 03/12/17 (from the past 48 hour(s))  Glucose, capillary     Status: Abnormal   Collection Time: 04/01/17  7:00 AM  Result Value Ref Range   Glucose-Capillary 130 (H) 65 - 99 mg/dL    Blood Alcohol level:  Lab Results  Component Value Date   ETH 37 (H) 03/12/2017   ETH <10 45/62/5638    Metabolic Disorder Labs: Lab Results  Component Value Date   HGBA1C 6.0 (H) 03/13/2017   MPG 126 03/13/2017   MPG 177 09/12/2016   Lab Results  Component Value Date   PROLACTIN 18.1 (H) 09/12/2016   PROLACTIN 6.5 05/27/2016   Lab Results  Component Value Date   CHOL 118 03/13/2017   TRIG 325 (H) 03/13/2017   HDL 31 (L) 03/13/2017   CHOLHDL 3.8 03/13/2017   VLDL 65 (H) 03/13/2017   LDLCALC 22 03/13/2017   LDLCALC 37 09/12/2016    Physical Findings: AIMS: Facial and Oral Movements Muscles of Facial Expression: None, normal Lips and Perioral Area: None, normal Jaw: None, normal Tongue: None, normal,Extremity Movements Upper (arms, wrists, hands, fingers): None, normal Lower (legs, knees, ankles, toes): None, normal, Trunk Movements Neck, shoulders, hips: None, normal, Overall Severity Severity of abnormal movements (highest score from questions above): None, normal Incapacitation due to abnormal movements: None, normal Patient's awareness of abnormal movements (rate only patient's report): No Awareness, Dental Status Current problems with teeth and/or dentures?: No Does patient usually wear dentures?: No  CIWA:  CIWA-Ar  Total: 0 COWS:  COWS Total Score: 2  Musculoskeletal: Strength & Muscle Tone: within normal limits Gait & Station: normal Patient leans: N/A  Psychiatric Specialty Exam: Physical Exam  Nursing note and vitals reviewed. Constitutional: He appears well-developed and well-nourished.  HENT:  Head: Normocephalic and atraumatic.  Eyes: Conjunctivae are normal. Pupils are equal, round, and reactive to light.  Neck: Normal range of motion.  Cardiovascular: Regular rhythm and normal heart sounds.  Respiratory: Effort normal. No respiratory distress.  GI: Soft.  Musculoskeletal: Normal range of motion.  Neurological: He is alert.  Skin: Skin is warm and dry.  Psychiatric: His mood appears anxious. His speech is delayed. He is slowed. Cognition and memory are normal. He expresses impulsivity. He exhibits a depressed mood. He expresses suicidal ideation.  No plan but feels suicidal    Review of Systems  Constitutional: Negative.   HENT: Negative.   Eyes: Negative.   Respiratory: Negative.   Cardiovascular: Negative.   Gastrointestinal: Negative.   Musculoskeletal: Negative.   Skin: Negative.   Neurological: Negative.   Psychiatric/Behavioral: Positive for depression and suicidal ideas. Negative for hallucinations, memory loss and substance abuse. The patient is nervous/anxious and has insomnia.     Blood pressure 125/81, pulse 87, temperature 97.9 F (36.6 C), temperature source Oral, resp. rate 18, height 6\' 2"  (1.88 m), weight 91.6 kg (202 lb), SpO2 99 %.Body mass index is 25.94 kg/m.  General Appearance: Casual  Eye Contact:  Good  Speech:  Slow  Volume:  Normal  Mood:  Depressed and feels  confused  Affect:  Depressed  Thought Process:  Coherent  Orientation:  Full (Time, Place, and Person)  Thought Content:  Logical  Suicidal Thoughts:  Yes.  without intent/plan  Homicidal Thoughts:  No  Memory:  Immediate;   Good Recent;   Fair Remote;   Fair  Judgement:  Fair   Insight:  Fair  Psychomotor Activity:  Decreased  Concentration:  Concentration: Fair  Recall:  [employer] of Knowledge:  Fair  Language:  Fair  Akathisia:  No  Handed:  Right  AIMS (if indicated):     Assets:  Desire for Improvement Housing Resilience  ADL's:  Intact  Cognition:  WNL  Sleep:  Number of Hours: 7     Treatment Plan Summary:  Treatment Plan Summary: 47 yo male with history of multiple admission, chronic depression and SI admitted due to SI and insomnia. Pt's main focus is insomnia and reports this has caused worsening depression. He has upcoming court hearing for DUI and needs to show the judge that he is making an effort for treatment. He is requesting residential or outpatient alcohol treatment. He reports chronic SI "everyday" for several years. He states that he will not act on it because of his children. He has had at least 10 hospitalization in the past year and has not had much benefit from that. He has poor response to medications. He does not have a daily routine and does not get out of the house which is likely contributing to insomnia (he sleeps in living room and hangs out there all day) and depression. HE also does not see a therapist which would be of benefit for him. Will try to help him get some sleep while in the hospital since this is a primary concern for him.  Plan:  Mood disorder -Restart home medications of Latuda now at 60mg  po daily, Remeron 15mg  po nightkly and Lexapro 20mg  podaily. He is no longer on Effexor or Depakote  -Hemoglobin A1c was 6.0 total cholesterol was 118. -EKG showed a QTc of 440 -The patient got his first ECT Treatment on Friday and is scheduled for Monday for 2nd treatment. So far he is tolerating ECT well - He is still having passive SI but is able to contract for safety  Insomnia -Continue trazodone 100 mg qhs -Ambien 5 mg qhs. He is aware that he will not be prescribed this outside of the hospital.   Alcohol  abuse -No current withdrawal symptoms -The patient was advised to abstain from alcohol as it may worsen mood symptoms. -He will be encouraged to engage in a meaningful recovery program at discharge.-  HTN -Vital signs stable -Carvedilol 6.25 mg BID -Continue aspirin 81 mg by mouth daily and Plavix 75 mg by mouth daily.  Hyperlipidemia -Continue Lipitpr 80mg  po daily  DM -Continue Metformin 1000 milligrams by mouth twice a day -We'll monitor blood sugars   -The patient will maintain on a carbohydrate limited diet  Disposition: -The patient has a stable living situation. -He will need psychotropic medication management follow-up appointment at the time of discharge.    Chauncey Mann, MD 04/01/2017, 11:35 AM

## 2017-04-01 NOTE — Progress Notes (Signed)
Patient is describing depression as episodic that are occurring few times a day and last for few hours, no manic behaviors reported, appetite has decreased as patient could not remember eating snacks , patient expressed difficulties concentrating and forgetting current events  Patient is checked often and every 15 minutes as he states thoughts of suicide ideations without any specifics or plan, patient did contract for safety of self and others, patient is sleeping long hours at a time no distress noted.

## 2017-04-01 NOTE — Progress Notes (Addendum)
Patient found in hallway upon my arrival. Patient is visible throughout the evening but not social with peers. Patient reports he will be going home tomorrow and feels ready for discharge. "I've been here a month. That's too long." Patient reports he is to have ECT tomorrow. MD contacted regarding orders. Received order for NPO after midnight, informed patient. Verbalized understanding. Denies pain, reports mild anxiety and depression but reports it is tolerable. Reports eating and voiding adequately. Compliant with HS medications and staff direction. Q 15 minute checks maintained. Will continue to monitor throughout the shift.  Patient reports sleeping adequately. Remains NPO since before midnight. CBG 140. Will endorse care to oncoming shift.

## 2017-04-01 NOTE — [facility] Group Notes (Signed)
LCSW Group Therapy Note 04/01/2017 1:15pm  Type of Therapy and Topic: Group Therapy: Feelings Around Returning Home & Establishing a Supportive Framework and Supporting Oneself When Supports Not Available  Participation Level: Minimal  Description of Group:  Patients first processed thoughts and feelings about upcoming discharge. These included fears of upcoming changes, lack of change, new living environments, judgements and expectations from others and overall stigma of mental health issues. The group then discussed the definition of a supportive framework, what that looks and feels like, and how do to discern it from an unhealthy non-supportive network. The group identified different types of supports as well as what to do when your family/friends are less than helpful or unavailable  Therapeutic Goals  1. Patient will identify one healthy supportive network that they can use at discharge. 2. Patient will identify one factor of a supportive framework and how to tell it from an unhealthy network. 3. Patient able to identify one coping skill to use when they do not have positive supports from others. 4. Patient will demonstrate ability to communicate their needs through discussion and/or role plays.  Summary of Patient Progress:  Pt scored his mood at a 5 (10 best).   Therapeutic Modalities Cognitive Behavioral Therapy Motivational Interviewing   Latajah Thuman  CUEBAS-COLON, LCSW 04/01/2017 10:00 AM

## 2017-04-02 ENCOUNTER — Inpatient Hospital Stay: Payer: Medicare Other | Admitting: Anesthesiology

## 2017-04-02 ENCOUNTER — Inpatient Hospital Stay: Payer: Medicare Other

## 2017-04-02 DIAGNOSIS — Z23 Encounter for immunization: Secondary | ICD-10-CM | POA: Diagnosis not present

## 2017-04-02 LAB — GLUCOSE, CAPILLARY
GLUCOSE-CAPILLARY: 140 mg/dL — AB (ref 65–99)
Glucose-Capillary: 147 mg/dL — ABNORMAL HIGH (ref 65–99)

## 2017-04-02 MED ORDER — SODIUM CHLORIDE 0.9 % IV SOLN
INTRAVENOUS | Status: DC | PRN
  Administered 2017-04-02: 10:00:00 via INTRAVENOUS

## 2017-04-02 MED ORDER — KETOROLAC TROMETHAMINE 30 MG/ML IJ SOLN
INTRAMUSCULAR | Status: AC
  Filled 2017-04-02: qty 1

## 2017-04-02 MED ORDER — ESCITALOPRAM OXALATE 20 MG PO TABS: 20.0000 mg | ORAL_TABLET | Freq: Every day | ORAL | 1 refills | Status: DC

## 2017-04-02 MED ORDER — MIRTAZAPINE 15 MG PO TABS: 15.0000 mg | ORAL_TABLET | Freq: Every day | ORAL | 0 refills | Status: DC

## 2017-04-02 MED ORDER — TRAZODONE HCL 100 MG PO TABS: 100.0000 mg | ORAL_TABLET | Freq: Every evening | ORAL | 1 refills | Status: DC | PRN

## 2017-04-02 MED ORDER — METFORMIN HCL 500 MG PO TABS: 500.0000 mg | ORAL_TABLET | Freq: Two times a day (BID) | ORAL | 1 refills | Status: DC

## 2017-04-02 MED ORDER — HYDROXYZINE HCL 50 MG PO TABS: 50.0000 mg | ORAL_TABLET | Freq: Three times a day (TID) | ORAL | 1 refills | Status: DC | PRN

## 2017-04-02 MED ORDER — CARVEDILOL 6.25 MG PO TABS: 6.2500 mg | ORAL_TABLET | Freq: Two times a day (BID) | ORAL | 1 refills | Status: DC

## 2017-04-02 MED ORDER — METHOHEXITAL SODIUM 0.5 G IJ SOLR
INTRAMUSCULAR | Status: AC
  Filled 2017-04-02: qty 500

## 2017-04-02 MED ORDER — PRAZOSIN HCL 2 MG PO CAPS: 2.0000 mg | ORAL_CAPSULE | Freq: Every day | ORAL | 1 refills | Status: DC

## 2017-04-02 MED ORDER — MIDAZOLAM HCL 2 MG/2ML IJ SOLN: 4.0000 mg | Freq: Once | INTRAMUSCULAR | Status: DC

## 2017-04-02 MED ORDER — HALOPERIDOL LACTATE 5 MG/ML IJ SOLN
INTRAMUSCULAR | Status: AC
  Filled 2017-04-02: qty 1

## 2017-04-02 MED ORDER — ATORVASTATIN CALCIUM 80 MG PO TABS: 80.0000 mg | ORAL_TABLET | Freq: Every day | ORAL | 1 refills | Status: DC

## 2017-04-02 MED ORDER — ASPIRIN 81 MG PO TBEC: 81.0000 mg | DELAYED_RELEASE_TABLET | Freq: Every day | ORAL | 1 refills | Status: DC

## 2017-04-02 MED ORDER — CLOPIDOGREL BISULFATE 75 MG PO TABS: 75.0000 mg | ORAL_TABLET | Freq: Every day | ORAL | 1 refills | Status: DC

## 2017-04-02 MED ORDER — METHOHEXITAL SODIUM 100 MG/10ML IV SOSY
PREFILLED_SYRINGE | INTRAVENOUS | Status: DC | PRN
  Administered 2017-04-02: 100 mg via INTRAVENOUS

## 2017-04-02 MED ORDER — MIDAZOLAM HCL 2 MG/2ML IJ SOLN
INTRAMUSCULAR | Status: AC
  Filled 2017-04-02: qty 4

## 2017-04-02 MED ORDER — KETOROLAC TROMETHAMINE 30 MG/ML IJ SOLN
30.0000 mg | Freq: Once | INTRAMUSCULAR | Status: AC
  Administered 2017-04-02: 30 mg via INTRAVENOUS

## 2017-04-02 MED ORDER — HALOPERIDOL LACTATE 5 MG/ML IJ SOLN
INTRAMUSCULAR | Status: DC | PRN
  Administered 2017-04-02: 5 mg via INTRAVENOUS

## 2017-04-02 MED ORDER — MIDAZOLAM HCL 2 MG/2ML IJ SOLN
INTRAMUSCULAR | Status: DC | PRN
  Administered 2017-04-02: 4 mg via INTRAVENOUS

## 2017-04-02 MED ORDER — SUCCINYLCHOLINE CHLORIDE 20 MG/ML IJ SOLN
INTRAMUSCULAR | Status: DC | PRN
  Administered 2017-04-02: 100 mg via INTRAVENOUS

## 2017-04-02 MED ORDER — ZOLPIDEM TARTRATE 10 MG PO TABS: 10.0000 mg | ORAL_TABLET | Freq: Every day | ORAL | 0 refills | Status: DC

## 2017-04-02 MED ORDER — LURASIDONE HCL 60 MG PO TABS: 60.0000 mg | ORAL_TABLET | Freq: Every day | ORAL | 1 refills | Status: DC

## 2017-04-02 MED ORDER — SODIUM CHLORIDE 0.9 % IV SOLN
500.0000 mL | Freq: Once | INTRAVENOUS | Status: AC
  Administered 2017-04-02: 500 mL via INTRAVENOUS

## 2017-04-02 NOTE — Progress Notes (Signed)
[place] spoke with patient in the hallway briefly. Patient stated he had ECT earlier. He was going to be discharged soon, he believed. Patient was still groggy and stated he would talk later. [place] available for follow-up at patient request.

## 2017-04-02 NOTE — Discharge Summary (Signed)
Physician Discharge Summary Note  Patient:  Michael Schmitt is an 47 y.o., male MRN:  [id number] DOB:  [birth year] Patient phone:  [phone] (home)  Patient address:   [place] [address],  Total Time spent with patient: 45 minutes  Date of Admission:  03/12/2017 Date of Discharge: April 02, 2017  Reason for Admission: Patient was admitted through the emergency room when he presented with suicidal ideation and agitation severe depression and in fact a recent suicide attempt.  Outpatient treatment had appeared to be ineffective  Principal Problem: Severe recurrent major depression without psychotic features [facility]) Discharge Diagnoses: Patient Active Problem List   Diagnosis Date Noted  . Severe recurrent major depression without psychotic features ([place]) [F33.2] 03/12/2017  . Generalized anxiety disorder [F41.1] 02/05/2017  . Chronic neck pain (Primary Area of Pain) (Left) [M54.2, G89.29] 11/22/2016  . Cervical facet syndrome (Left) [T01.779] 11/22/2016  . Cervical radiculitis (Left) [M54.12] 11/22/2016  . History of cervical spinal surgery [Z98.890] 11/22/2016  . Cervical spondylosis [M47.22] 11/22/2016  . Chronic shoulder pain (Secondary Area of Pain) (Left) [M25.512, G89.29] 11/22/2016  . 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 Chronic low back pain [facility] Area of Pain) (Bilateral) (L>R) [M54.5, G89.29] 11/22/2016  . Lumbar facet syndrome (Bilateral) (L>R) [M47.816] 11/22/2016  . Chronic pain syndrome [G89.4] 11/21/2016  . Long term prescription benzodiazepine use [Z79.899] 11/21/2016  . Opiate use [F11.90] 11/21/2016  . Uncontrolled type 2 diabetes mellitus without complication, without long-term current use of insulin ([place]) [E11.65] 10/19/2016  . Chronic Suicidal ideation [R45.851] 09/15/2016  . Coronary artery disease [I25.10] 09/12/2016  . HTN (hypertension) [I10] 06/26/2016  . GERD (gastroesophageal reflux disease) [K21.9] 06/26/2016   . Alcohol use disorder, severe, 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[place]) [F10.21] 06/26/2016  . Moderate benzodiazepine use disorder ([place]) [F13.20] 06/11/2016  . Sedative, hypnotic or anxiolytic use disorder, severe, dependence ([place]) [F13.20] 05/12/2016  . Ventricular fibrillation ([place]) [I49.01] 04/05/2016  . 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 Cervical spondylosis without myelopathy [M47.812] 07/24/2014  . Severe episode of recurrent major depressive disorder, without psychotic features ([place]) [F33.2] 04/15/2013  . Tobacco use disorder [F17.200] 10/20/2010  . Asthma [J45.909] 10/20/2010    Past Psychiatric History: Patient has a long history of mood disorder and anxiety disorder which has not really shown consistent improvement with medication management.  He has had a history as well of complicating this by overusing and misusing prescription medication.  Patient has a history of suicide attempts of self injury of noncompliance.  Multiple presentations to the emergency room.  Past Medical History:  Past Medical History:  Diagnosis Date  . Anxiety   . Anxiety   . Arthritis    cerv. stenosis, spondylosis, HNP- lower back , has been followed in pain clinic, has  had injection s in cerv. area  . Blood dyscrasia    told that when he was young he was a" free bleeder"  . CAD (coronary artery disease)   . Cervical spondylosis without myelopathy 07/24/2014  . Cervicogenic headache 07/24/2014  . Chronic kidney disease    renal calculi- passed spontaneously  . Depression   . Diabetes mellitus without complication ([place])   . Fatty liver   . GERD (gastroesophageal reflux disease)   . Headache(784.0)   . Hyperlipidemia   . Hypertension   . Mental disorder   . MI, old   . RLS (restless legs syndrome)    detected on sleep study  . Shortness of breath   . Ventricular  fibrillation ([place]) 04/05/2016    Past Surgical History:  Procedure Laterality Date  . ANTERIOR CERVICAL DECOMP/DISCECTOMY FUSION   11/13/2011   Procedure: ANTERIOR CERVICAL DECOMPRESSION/DISCECTOMY FUSION 1 LEVEL;  Surgeon: [name], MD;  Location: [place] [facility] ORS;  Service: Neurosurgery;  Laterality: N/A;  Anterior Cervical Decompression/discectomy Fusion. Cervical three-four.  Marland Kitchen CARDIAC CATHETERIZATION N/A 01/01/2015   Procedure: Left Heart Cath and Coronary Angiography;  Surgeon: [name], MD;  Location: [place] CV LAB;  Service: Cardiovascular;  Laterality: N/A;  . CARDIAC CATHETERIZATION N/A 04/05/2016   Procedure: Left Heart Cath and Coronary Angiography;  Surgeon: Belva Crome, MD;  Location: [place] CV LAB;  Service: Cardiovascular;  Laterality: N/A;  . CARDIAC CATHETERIZATION N/A 04/05/2016   Procedure: Coronary Stent Intervention;  Surgeon: Belva Crome, MD;  Location: [place] CV LAB;  Service: Cardiovascular;  Laterality: N/A;  . CARDIAC CATHETERIZATION N/A 04/05/2016   Procedure: Intravascular Ultrasound/IVUS;  Surgeon: Belva Crome, MD;  Location: [place] CV LAB;  Service: Cardiovascular;  Laterality: N/A;  . NASAL SINUS SURGERY     2005   Family History:  Family History  Problem Relation Age of Onset  . Prostate cancer Father   . Hypertension Mother   . Kidney Stones Mother   . Anxiety disorder Mother   . Depression Mother   . COPD Sister   . Hypertension Sister   . Diabetes Sister   . Depression Sister   . Anxiety disorder Sister   . Seizures Sister   . ADD / ADHD Son   . ADD / ADHD Daughter    Family Psychiatric  History: Positive for anxiety Social History:  Social History   Substance and Sexual Activity  Alcohol Use No  . Alcohol/week: 0.0 oz   Comment: last drinked in 3 months.      Social History   Substance and Sexual Activity  Drug Use No    Social History   Socioeconomic History  . Marital status: Widowed    Spouse name: None  . Number of children: 3  . Years of education: 15  . Highest education level: None  Social Needs  . Financial resource strain:  None  . Food insecurity - worry: None  . Food insecurity - inability: None  . Transportation needs - medical: None  . Transportation needs - non-medical: None  Occupational History    Comment: unemployed  Tobacco Use  . Smoking status: Current Every Day Smoker    Packs/day: 2.00    Years: 17.00    Pack years: 34.00    Types: Cigarettes, Cigars  . Smokeless tobacco: Former Systems developer  . Tobacco comment: occassional snuff   Substance and Sexual Activity  . Alcohol use: No    Alcohol/week: 0.0 oz    Comment: last drinked in 3 months.   . Drug use: No  . Sexual activity: Not Currently  Other Topics Concern  . None  Social History Narrative   Patient is right handed.   Patient drinks 2 sodas daily.    Hospital Course: Patient was admitted to the hospital and treated with group, individual, and medication therapy.  For most of his course Dr. Wonda Olds was his primary physician.  Some changes were made to his antidepressant medications in an attempt to find something that would be more tolerable for him.  Patient was able to stay off of benzodiazepines without completely decompensating.  He continued to make suicidal statements although he did not engage in any dangerous  behavior in the hospital and for the most part was compliant.  Patient had persistently been requesting consideration for ECT.  This had been addressed several times in the past but there had always been practical reasons why it did not work out.  I was asked to reconsult with the patient and agreed that a trial of ECT would be appropriate for his severe depression.  As of the day of discharge patient has had 2 bilateral ECT treatments both of which he has tolerated without significant complication.  We had been considering discharge on Friday but his anxiety spiked and he did not feel safe.  Today however the patient is requesting discharge saying that he feels he is not actively suicidal, has his thoughts and behavior under control and  will be safe and compliant outpatient.  Therefore he is being discharged today on his current combination of medicines with a plan that we will continue this course of ECT as an outpatient.  Next treatment is scheduled for Wednesday the ninth and we will continue a Monday Wednesday and Friday schedule going forward.  Reportedly, the patient's sister now has access to reliable transportation which will allow for outpatient treatment to be done  Physical Findings: AIMS: Facial and Oral Movements Muscles of Facial Expression: None, normal Lips and Perioral Area: None, normal Jaw: None, normal Tongue: None, normal,Extremity Movements Upper (arms, wrists, hands, fingers): None, normal Lower (legs, knees, ankles, toes): None, normal, Trunk Movements Neck, shoulders, hips: None, normal, Overall Severity Severity of abnormal movements (highest score from questions above): None, normal Incapacitation due to abnormal movements: None, normal Patient's awareness of abnormal movements (rate only patient's report): No Awareness, Dental Status Current problems with teeth and/or dentures?: No Does patient usually wear dentures?: No  CIWA:  CIWA-Ar Total: 0 COWS:  COWS Total Score: 2  Musculoskeletal: Strength & Muscle Tone: within normal limits Gait & Station: normal Patient leans: N/A  Psychiatric Specialty Exam: Physical Exam  Nursing note and vitals reviewed. Constitutional: He appears well-developed and well-nourished.  HENT:  Head: Normocephalic and atraumatic.  Eyes: Conjunctivae are normal. Pupils are equal, round, and reactive to light.  Neck: Normal range of motion.  Cardiovascular: Regular rhythm and normal heart sounds.  Respiratory: Effort normal. No respiratory distress.  GI: Soft.  Musculoskeletal: Normal range of motion.  Neurological: He is alert.  Skin: Skin is warm and dry.  Psychiatric: Judgment normal. His affect is blunt. His speech is delayed. He is slowed. Thought content  is not paranoid. Cognition and memory are normal. He expresses no homicidal and no suicidal ideation.    Review of Systems  Constitutional: Negative.   HENT: Negative.   Eyes: Negative.   Respiratory: Negative.   Cardiovascular: Negative.   Gastrointestinal: Negative.   Musculoskeletal: Positive for myalgias.  Skin: Negative.   Neurological: Negative.   Psychiatric/Behavioral: Positive for depression. Negative for hallucinations, memory loss, substance abuse and suicidal ideas. The patient is nervous/anxious. The patient does not have insomnia.     Blood pressure 126/86, pulse (!) 59, temperature 97.9 F (36.6 C), resp. rate 13, height 6\' 2"  (1.88 m), weight 91.2 kg (201 lb), SpO2 99 %.Body mass index is 25.81 kg/m.  General Appearance: Casual  Eye Contact:  Fair  Speech:  Slow  Volume:  Decreased  Mood:  Dysphoric  Affect:  Constricted  Thought Process:  Coherent  Orientation:  Full (Time, Place, and Person)  Thought Content:  Logical  Suicidal Thoughts:  No  Homicidal Thoughts:  No  Memory:  Immediate;   Fair Recent;   Fair Remote;   Fair  Judgement:  Fair  Insight:  Fair  Psychomotor Activity:  Decreased  Concentration:  Concentration: Fair  Recall:  [place] of Knowledge:  Fair  Language:  Fair  Akathisia:  No  Handed:  Right  AIMS (if indicated):     Assets:  Communication Skills Desire for Improvement Housing Resilience  ADL's:  Intact  Cognition:  WNL  Sleep:  Number of Hours: 7.5     Have you used any form of tobacco in the last 30 days? (Cigarettes, Smokeless Tobacco, Cigars, and/or Pipes): Yes  Has this patient used any form of tobacco in the last 30 days? (Cigarettes, Smokeless Tobacco, Cigars, and/or Pipes) Yes, Yes, A prescription for an FDA-approved tobacco cessation medication was offered at discharge and the patient refused  Blood Alcohol level:  Lab Results  Component Value Date   ETH 37 (H) 03/12/2017   ETH <10 96/29/5284    Metabolic  Disorder Labs:  Lab Results  Component Value Date   HGBA1C 6.0 (H) 03/13/2017   MPG 126 03/13/2017   MPG 177 09/12/2016   Lab Results  Component Value Date   PROLACTIN 18.1 (H) 09/12/2016   PROLACTIN 6.5 05/27/2016   Lab Results  Component Value Date   CHOL 118 03/13/2017   TRIG 325 (H) 03/13/2017   HDL 31 (L) 03/13/2017   CHOLHDL 3.8 03/13/2017   VLDL 65 (H) 03/13/2017   LDLCALC 22 03/13/2017   LDLCALC 37 09/12/2016    See Psychiatric Specialty Exam and Suicide Risk Assessment completed by Attending Physician prior to discharge.  Discharge destination:  Home  Is patient on multiple antipsychotic therapies at discharge:  No   Has Patient had three or more failed trials of antipsychotic monotherapy by history:  No  Recommended Plan for Multiple Antipsychotic Therapies: NA  Discharge Instructions    Diet - low sodium heart healthy   Complete by:  As directed    Increase activity slowly   Complete by:  As directed      Allergies as of 04/02/2017      Reactions   Prednisone Other (See Comments)   Hypertension, makes him feel spacey   Hydrocodone Other (See Comments)   Headache, irritable   Varenicline Other (See Comments)   Suicidal thoughts      Medication List    STOP taking these medications   divalproex 500 MG DR tablet Commonly known as:  DEPAKOTE   gabapentin 400 MG capsule Commonly known as:  NEURONTIN   risperiDONE 2 MG tablet Commonly known as:  RISPERDAL   venlafaxine XR 75 MG 24 hr capsule Commonly known as:  EFFEXOR-XR     TAKE these medications     Indication  aspirin 81 MG EC tablet Take 1 tablet (81 mg total) by mouth daily. Start taking on:  04/03/2017  Indication:  Coronary Bypass Surgery   atorvastatin 80 MG tablet Commonly known as:  LIPITOR Take 1 tablet (80 mg total) by mouth daily at 6 PM.  Indication:  High Amount of Fats in the Blood   carvedilol 6.25 MG tablet Commonly known as:  COREG Take 1 tablet (6.25 mg total) by  mouth 2 (two) times daily with a meal.  Indication:  Chronic Angina Following Exercise, Stress, or Excitement   clopidogrel 75 MG tablet Commonly known as:  PLAVIX Take 1 tablet (75 mg total) by mouth daily. Start taking on:  04/03/2017  Indication:  Disease of the Peripheral Arteries   escitalopram 20 MG tablet Commonly known as:  LEXAPRO Take 1 tablet (20 mg total) by mouth daily. Start taking on:  04/03/2017  Indication:  Major Depressive Disorder   hydrOXYzine 50 MG tablet Commonly known as:  ATARAX/VISTARIL Take 1 tablet (50 mg total) by mouth 3 (three) times daily as needed for anxiety. What changed:  reasons to take this  Indication:  Feeling Anxious   Lurasidone HCl 60 MG Tabs Take 1 tablet (60 mg total) by mouth daily with breakfast. Start taking on:  04/03/2017  Indication:  Depressive Phase of Manic-Depression   metFORMIN 500 MG tablet Commonly known as:  GLUCOPHAGE Take 1 tablet (500 mg total) by mouth 2 (two) times daily with a meal.  Indication:  Type 2 Diabetes   mirtazapine 15 MG tablet Commonly known as:  REMERON Take 1 tablet (15 mg total) by mouth at bedtime. What changed:    medication strength  how much to take  Indication:  Major Depressive Disorder   nitroGLYCERIN 0.4 MG SL tablet Commonly known as:  NITROSTAT Place 1 tablet (0.4 mg total) under the tongue every 5 (five) minutes x 3 doses as needed.  Indication:  Acute Angina Pectoris   prazosin 2 MG capsule Commonly known as:  MINIPRESS Take 1 capsule (2 mg total) by mouth at bedtime.  Indication:  nightmares   traZODone 100 MG tablet Commonly known as:  DESYREL Take 1 tablet (100 mg total) by mouth at bedtime as needed for sleep.  Indication:  Trouble Sleeping   zolpidem 10 MG tablet Commonly known as:  AMBIEN Take 1 tablet (10 mg total) by mouth at bedtime.  Indication:  [place], [place] on 05/16/2017.   Why:  1:00pm, for  Hospital Follow up.  This is earliest available with Pt's chosen provider.   Contact information: [place] [address] [phone]        Gonzella Lex, MD. Go on 04/04/2017.   Specialty:  Psychiatry Why:  for ECT follow up.  ECT Nurse will call you to let you know what time that morning.   Contact information: [place] [address] [phone]           Follow-up recommendations:  Activity:  Activity as tolerated Diet:  Heart healthy, low carb diet Other:  ECT treatment next scheduled for Wednesday the ninth.  Otherwise he will follow-up with Dr. Collie Siad for his usual outpatient psychiatric follow-up.  Comments: See note above  Signed: Alethia Berthold, MD 04/02/2017, 4:26 PM

## 2017-04-02 NOTE — Tx Team (Signed)
Interdisciplinary Treatment and Diagnostic Plan Update  04/02/2017 Time of Session: 11:00 AM Michael Schmitt MRN: [id number]  Principal Diagnosis: Severe recurrent major depression without psychotic features [facility])  Secondary Diagnoses: Principal Problem:   Severe recurrent major depression without psychotic features ([place]) Active Problems:   Tobacco use disorder   Current Medications:  Current Facility-Administered Medications  Medication Dose Route Frequency Provider Last Rate Last Dose  . acetaminophen (TYLENOL) tablet 650 mg  650 mg Oral Q6H PRN Clapacs, Madie Reno, MD   650 mg at 03/31/17 0834  . alum & mag hydroxide-simeth (MAALOX/MYLANTA) 200-200-20 MG/5ML suspension 30 mL  30 mL Oral Q4H PRN Clapacs, John T, MD      . aspirin EC tablet 81 mg  81 mg Oral Daily Clapacs, Madie Reno, MD   81 mg at 04/02/17 1225  . atorvastatin (LIPITOR) tablet 80 mg  80 mg Oral q1800 Clapacs, Madie Reno, MD   80 mg at 04/01/17 1645  . carvedilol (COREG) tablet 6.25 mg  6.25 mg Oral BID WC Clapacs, John T, MD   6.25 mg at 04/02/17 1226  . clopidogrel (PLAVIX) tablet 75 mg  75 mg Oral Daily Clapacs, Madie Reno, MD   75 mg at 04/02/17 1225  . escitalopram (LEXAPRO) tablet 20 mg  20 mg Oral Daily Ramond Dial, MD   20 mg at 04/02/17 1226  . hydrOXYzine (ATARAX/VISTARIL) tablet 50 mg  50 mg Oral TID PRN Marylin Crosby, MD   50 mg at 04/02/17 1224  . ibuprofen (ADVIL,MOTRIN) tablet 600 mg  600 mg Oral Q6H PRN Marylin Crosby, MD   600 mg at 04/02/17 1224  . ketorolac (TORADOL) 30 MG/ML injection           . lurasidone (LATUDA) tablet 60 mg  60 mg Oral Q breakfast Ramond Dial, MD   60 mg at 04/02/17 1226  . magnesium hydroxide (MILK OF MAGNESIA) suspension 30 mL  30 mL Oral Daily PRN Clapacs, John T, MD      . metFORMIN (GLUCOPHAGE) tablet 500 mg  500 mg Oral BID WC Clapacs, John T, MD   500 mg at 04/02/17 1226  . midazolam (VERSED) injection 4 mg  4 mg Intravenous Once Clapacs, John T, MD      . mirtazapine  (REMERON) tablet 15 mg  15 mg Oral QHS McNew, Tyson Babinski, MD   15 mg at 04/01/17 2125  . multivitamin with minerals tablet 1 tablet  1 tablet Oral Daily McNew, Tyson Babinski, MD   1 tablet at 04/02/17 1225  . nicotine (NICODERM CQ - dosed in mg/24 hours) patch 21 mg  21 mg Transdermal Daily McNew, Tyson Babinski, MD   21 mg at 04/02/17 1227  . pneumococcal 23 valent vaccine (PNU-IMMUNE) injection 0.5 mL  0.5 mL Intramuscular Tomorrow-1000 McNew, Holly R, MD      . prazosin (MINIPRESS) capsule 2 mg  2 mg Oral QHS Clapacs, Madie Reno, MD   2 mg at 04/01/17 2125  . thiamine (VITAMIN B-1) tablet 100 mg  100 mg Oral Daily McNew, Tyson Babinski, MD   100 mg at 04/02/17 1228  . traZODone (DESYREL) tablet 100 mg  100 mg Oral QHS PRN Ramond Dial, MD   100 mg at 03/31/17 2114  . zolpidem (AMBIEN) tablet 10 mg  10 mg Oral QHS Ramond Dial, MD   10 mg at 04/01/17 2125   PTA Medications: Medications Prior to Admission  Medication Sig Dispense Refill Last Dose  . aspirin EC  81 MG EC tablet Take 1 tablet (81 mg total) by mouth daily. 30 tablet 0 03/29/2017  . atorvastatin (LIPITOR) 80 MG tablet Take 1 tablet (80 mg total) by mouth daily at 6 PM. 30 tablet 0 03/29/2017  . carvedilol (COREG) 6.25 MG tablet Take 1 tablet (6.25 mg total) by mouth 2 (two) times daily with a meal. 60 tablet 0 03/29/2017  . clopidogrel (PLAVIX) 75 MG tablet Take 1 tablet (75 mg total) by mouth daily. 30 tablet 0 03/29/2017  . divalproex (DEPAKOTE) 500 MG DR tablet Take 500 mg by mouth 2 (two) times daily.   03/29/2017  . gabapentin (NEURONTIN) 400 MG capsule Take 2 capsules (800 mg total) by mouth 4 (four) times daily. 240 capsule 0 03/29/2017  . hydrOXYzine (ATARAX/VISTARIL) 50 MG tablet Take 50 mg by mouth 3 (three) times daily as needed.   03/29/2017  . metFORMIN (GLUCOPHAGE) 500 MG tablet Take 1 tablet (500 mg total) by mouth 2 (two) times daily with a meal. 60 tablet 0 03/29/2017  . mirtazapine (REMERON) 45 MG tablet Take 1 tablet (45 mg total) by mouth at  bedtime. 30 tablet 0 03/29/2017  . nitroGLYCERIN (NITROSTAT) 0.4 MG SL tablet Place 1 tablet (0.4 mg total) under the tongue every 5 (five) minutes x 3 doses as needed. 10 tablet 0 03/29/2017  . prazosin (MINIPRESS) 2 MG capsule Take 2 mg by mouth at bedtime.   03/29/2017  . risperiDONE (RISPERDAL) 2 MG tablet Take 2 mg by mouth at bedtime.   03/29/2017  . venlafaxine XR (EFFEXOR-XR) 75 MG 24 hr capsule Take 3 capsules (225 mg total) daily with breakfast by mouth. 90 capsule 1 03/29/2017    Patient Stressors: Health problems Medication change or noncompliance Occupational concerns Substance abuse  Patient Strengths: Average or above average intelligence Capable of independent living Communication skills Motivation for treatment/growth Supportive family/friends  Treatment Modalities: Medication Management, Group therapy, Case management,  1 to 1 session with clinician, Psychoeducation, Recreational therapy.   Physician Treatment Plan for Primary Diagnosis: Severe recurrent major depression without psychotic features ([place]) Long Term Goal(s): Improvement in symptoms so as ready for discharge   Short Term Goals: Ability to disclose and discuss suicidal ideas Ability to demonstrate self-control will improve  Medication Management: Evaluate patient's response, side effects, and tolerance of medication regimen.  Therapeutic Interventions: 1 to 1 sessions, Unit Group sessions and Medication administration.  Evaluation of Outcomes: Adequate for discharge  Physician Treatment Plan for Secondary Diagnosis: Principal Problem:   Severe recurrent major depression without psychotic features ([place]) Active Problems:   Tobacco use disorder  Long Term Goal(s): Improvement in symptoms so as ready for discharge   Short Term Goals: Ability to disclose and discuss suicidal ideas Ability to demonstrate self-control will improve     Medication Management: Evaluate patient's response, side effects, and  tolerance of medication regimen.  Therapeutic Interventions: 1 to 1 sessions, Unit Group sessions and Medication administration.  Evaluation of Outcomes: Adequate for discharge   RN Treatment Plan for Primary Diagnosis: Severe recurrent major depression without psychotic features ([place]) Long Term Goal(s): Knowledge of disease and therapeutic regimen to maintain health will improve  Short Term Goals: Ability to participate in decision making will improve, Ability to identify and develop effective coping behaviors will improve and Compliance with prescribed medications will improve  Medication Management: RN will administer medications as ordered by provider, will assess and evaluate patient's response and provide education to patient for prescribed medication. RN will report any adverse  and/or side effects to prescribing provider.  Therapeutic Interventions: 1 on 1 counseling sessions, Psychoeducation, Medication administration, Evaluate responses to treatment, Monitor vital signs and CBGs as ordered, Perform/monitor CIWA, COWS, AIMS and Fall Risk screenings as ordered, Perform wound care treatments as ordered.  Evaluation of Outcomes: Adequate for discharge   LCSW Treatment Plan for Primary Diagnosis: Severe recurrent major depression without psychotic features ([place]) Long Term Goal(s): Safe transition to appropriate next level of care at discharge, Engage patient in therapeutic group addressing interpersonal concerns.  Short Term Goals: Engage patient in aftercare planning with referrals and resources, Identify triggers associated with mental health/substance abuse issues and Increase skills for wellness and recovery  Therapeutic Interventions: Assess for all discharge needs, 1 to 1 time with Social worker, Explore available resources and support systems, Assess for adequacy in community support network, Educate family and significant other(s) on suicide prevention, Complete Psychosocial  Assessment, Interpersonal group therapy.  Evaluation of Outcomes: Adequate for discharge   Progress in Treatment: Attending groups: No. Participating in groups: No. Taking medication as prescribed: Yes. Toleration medication: Yes. Family/Significant other contact made: No, will contact:  Two attempts made for Michael Schmitt's sister, Olin Hauser with no callbacks.-Pt says sisters phone has been disconnected and has no other numbers he will give for contacts. Patient understands diagnosis: Yes. Discussing patient identified problems/goals with staff: Yes. Medical problems stabilized or resolved: Yes. Denies suicidal/homicidal ideation: Yes. Issues/concerns per patient self-inventory: No. Other:    New problem(s) identified: No, Describe:     New Short Term/Long Term Goal(s): to go to residential treatment program    Discharge Plan or Barriers: No availability of residential beds for substance abuse treatment;  No significant improvement in psychiatric symptoms-Pt will begin ECT treatments on Friday.  Unable to make arrangements at this time for outpatient ECT.  Reason for Continuation of Hospitalization: Depression Medication stabilization Suicidal ideation  Estimated Length of Stay: 0 days  Recreational Therapy: Patient Stressors: SI Patient Goal: Patient will identify 3 healthy leisure activities to participate in post discharge x5 days.  Attendees: Patient: 03/23/2017 11:14 AM  Physician:John Clapacs, MD 03/23/2017 11:14 AM  Nursing:Phyllis Cobb, RN 03/23/2017 11:14 AM  RN Care Manager:  03/23/2017 11:14 AM  Social Worker: Dossie Arbour, LCSW 03/23/2017 11:14 AM  Recreational Therapist: Roanna Epley, LRT/CTRS 03/23/2017 11:14 AM  Other:  03/23/2017 11:14 AM  Other:  03/23/2017 11:14 AM  Other: 03/23/2017 11:14 AM     Scribe for Treatment Team: August Saucer, LCSW 04/02/2017 3:35 PM

## 2017-04-02 NOTE — Anesthesia Post-op Follow-up Note (Signed)
Anesthesia QCDR form completed.        

## 2017-04-02 NOTE — H&P (Signed)
Michael Schmitt is an 47 y.o. male.   Chief Complaint: Patient continues to have mild depression and anxiety but is not actively suicidal no other new complaints HPI: History of recurrent severe depression without response to medication currently starting ECT course  Past Medical History:  Diagnosis Date  . Anxiety   . Anxiety   . Arthritis    cerv. stenosis, spondylosis, HNP- lower back , has been followed in pain clinic, has  had injection s in cerv. area  . Blood dyscrasia    told that when he was young he was a" free bleeder"  . CAD (coronary artery disease)   . Cervical spondylosis without myelopathy 07/24/2014  . Cervicogenic headache 07/24/2014  . Chronic kidney disease    renal calculi- passed spontaneously  . Depression   . Diabetes mellitus without complication ([place])   . Fatty liver   . GERD (gastroesophageal reflux disease)   . Headache(784.0)   . Hyperlipidemia   . Hypertension   . Mental disorder   . MI, old   . RLS (restless legs syndrome)    detected on sleep study  . Shortness of breath   . Ventricular fibrillation ([facility]) 04/05/2016    Past Surgical History:  Procedure Laterality Date  . ANTERIOR CERVICAL DECOMP/DISCECTOMY FUSION  11/13/2011   Procedure: ANTERIOR CERVICAL DECOMPRESSION/DISCECTOMY FUSION 1 LEVEL;  Surgeon: [name], MD;  Location: [place] NEURO ORS;  Service: Neurosurgery;  Laterality: N/A;  Anterior Cervical Decompression/discectomy Fusion. Cervical three-four.  Marland Kitchen CARDIAC CATHETERIZATION N/A 01/01/2015   Procedure: Left Heart Cath and Coronary Angiography;  Surgeon: [name], MD;  Location: [place] CV LAB;  Service: Cardiovascular;  Laterality: N/A;  . CARDIAC CATHETERIZATION N/A 04/05/2016   Procedure: Left Heart Cath and Coronary Angiography;  Surgeon: Belva Crome, MD;  Location: [place] CV LAB;  Service: Cardiovascular;  Laterality: N/A;  . CARDIAC CATHETERIZATION N/A 04/05/2016   Procedure: Coronary Stent Intervention;  Surgeon:  Belva Crome, MD;  Location: [place] CV LAB;  Service: Cardiovascular;  Laterality: N/A;  . CARDIAC CATHETERIZATION N/A 04/05/2016   Procedure: Intravascular Ultrasound/IVUS;  Surgeon: Belva Crome, MD;  Location: [place] CV LAB;  Service: Cardiovascular;  Laterality: N/A;  . NASAL SINUS SURGERY     2005    Family History  Problem Relation Age of Onset  . Prostate cancer Father   . Hypertension Mother   . Kidney Stones Mother   . Anxiety disorder Mother   . Depression Mother   . COPD Sister   . Hypertension Sister   . Diabetes Sister   . Depression Sister   . Anxiety disorder Sister   . Seizures Sister   . ADD / ADHD Son   . ADD / ADHD Daughter    Social History:  reports that he has been smoking cigarettes and cigars.  He has a 34.00 pack-year smoking history. He has quit using smokeless tobacco. He reports that he does not drink alcohol or use drugs.  Allergies:  Allergies  Allergen Reactions  . Prednisone Other (See Comments)    Hypertension, makes him feel spacey  . Hydrocodone Other (See Comments)    Headache, irritable  . Varenicline Other (See Comments)    Suicidal thoughts    Medications Prior to Admission  Medication Sig Dispense Refill  . aspirin EC 81 MG EC tablet Take 1 tablet (81 mg total) by mouth daily. 30 tablet 0  . atorvastatin (LIPITOR) 80 MG tablet Take 1 tablet (80  mg total) by mouth daily at 6 PM. 30 tablet 0  . carvedilol (COREG) 6.25 MG tablet Take 1 tablet (6.25 mg total) by mouth 2 (two) times daily with a meal. 60 tablet 0  . clopidogrel (PLAVIX) 75 MG tablet Take 1 tablet (75 mg total) by mouth daily. 30 tablet 0  . divalproex (DEPAKOTE) 500 MG DR tablet Take 500 mg by mouth 2 (two) times daily.    Marland Kitchen gabapentin (NEURONTIN) 400 MG capsule Take 2 capsules (800 mg total) by mouth 4 (four) times daily. 240 capsule 0  . hydrOXYzine (ATARAX/VISTARIL) 50 MG tablet Take 50 mg by mouth 3 (three) times daily as needed.    . metFORMIN  (GLUCOPHAGE) 500 MG tablet Take 1 tablet (500 mg total) by mouth 2 (two) times daily with a meal. 60 tablet 0  . mirtazapine (REMERON) 45 MG tablet Take 1 tablet (45 mg total) by mouth at bedtime. 30 tablet 0  . nitroGLYCERIN (NITROSTAT) 0.4 MG SL tablet Place 1 tablet (0.4 mg total) under the tongue every 5 (five) minutes x 3 doses as needed. 10 tablet 0  . prazosin (MINIPRESS) 2 MG capsule Take 2 mg by mouth at bedtime.    . risperiDONE (RISPERDAL) 2 MG tablet Take 2 mg by mouth at bedtime.    Marland Kitchen venlafaxine XR (EFFEXOR-XR) 75 MG 24 hr capsule Take 3 capsules (225 mg total) daily with breakfast by mouth. 90 capsule 1    Results for orders placed or performed during the hospital encounter of 03/12/17 (from the past 48 hour(s))  Glucose, capillary     Status: Abnormal   Collection Time: 04/01/17  7:00 AM  Result Value Ref Range   Glucose-Capillary 130 (H) 65 - 99 mg/dL  Glucose, capillary     Status: None   Collection Time: 04/01/17  4:09 PM  Result Value Ref Range   Glucose-Capillary 90 65 - 99 mg/dL  Glucose, capillary     Status: Abnormal   Collection Time: 04/02/17  6:29 AM  Result Value Ref Range   Glucose-Capillary 140 (H) 65 - 99 mg/dL   No results found.  Review of Systems  Constitutional: Negative.   HENT: Negative.   Eyes: Negative.   Respiratory: Negative.   Cardiovascular: Negative.   Gastrointestinal: Negative.   Musculoskeletal: Negative.   Skin: Negative.   Neurological: Negative.   Psychiatric/Behavioral: Positive for depression. Negative for hallucinations, memory loss, substance abuse and suicidal ideas. The patient is nervous/anxious. The patient does not have insomnia.     Blood pressure 139/88, pulse 82, temperature 98.2 F (36.8 C), temperature source Oral, resp. rate 18, height 6\' 2"  (1.88 m), weight 91.2 kg (201 lb), SpO2 99 %. Physical Exam  Nursing note and vitals reviewed. Constitutional: He appears well-developed and well-nourished.  HENT:  Head:  Normocephalic and atraumatic.  Eyes: Conjunctivae are normal. Pupils are equal, round, and reactive to light.  Neck: Normal range of motion.  Cardiovascular: Regular rhythm and normal heart sounds.  Respiratory: Effort normal. No respiratory distress.  GI: Soft.  Musculoskeletal: Normal range of motion.  Neurological: He is alert.  Skin: Skin is warm and dry.  Psychiatric: Judgment and thought content normal. His affect is blunt. His speech is delayed. He is slowed. Cognition and memory are normal.     Assessment/Plan Tentative plan at this point is that after treatment today he will be going home this afternoon but we will continue the course of ECT as an outpatient  Alethia Berthold, MD 04/02/2017,  10:12 AM

## 2017-04-02 NOTE — Progress Notes (Signed)
Affect constricted.  Denies SI/HI/AVH.  Verbalizes that he is ready to go home. Discharge instructions given, verbalized understanding.  Prescriptions given and personal belongings returned.  Escorted off unit by this Probation officer to meet family to travel home.

## 2017-04-02 NOTE — Transfer of Care (Signed)
Immediate Anesthesia Transfer of Care Note  Patient: Michael Schmitt  Procedure(s) Performed: ECT TX  Patient Location: PACU  Anesthesia Type:General  Level of Consciousness: awake, alert  and oriented  Airway & Oxygen Therapy: Patient Spontanous Breathing and Patient connected to face mask oxygen  Post-op Assessment: Report given to RN and Post -op Vital signs reviewed and stable  Post vital signs: Reviewed and stable  Last Vitals:  Vitals:   04/02/17 0932 04/02/17 1028  BP: 139/88 123/72  Pulse: 82 72  Resp:  15  Temp: 36.8 C 37.1 C  SpO2: 99% 97%    Last Pain:  Vitals:   04/02/17 0932  TempSrc: Oral  PainSc:       Patients Stated Pain Goal: 0 (11/08/[birth year] 1856)  Complications: No apparent anesthesia complications

## 2017-04-02 NOTE — Procedures (Signed)
ECT SERVICES Physician's Interval Evaluation & Treatment Note  Patient Identification: Tytan Sandate MRN:  [id number] Date of Evaluation:  04/02/2017 TX #: 2  MADRS:   MMSE:   P.E. Findings:  No change to physical exam vitals unremarkable heart and  Psychiatric Interval Note:  Not actively suicidal still depressed and anxious  Subjective:  Patient is a 47 y.o. male seen for evaluation for Electroconvulsive Therapy. Slightly better  Treatment Summary:   []   Right Unilateral             [x]  Bilateral   % Energy : 1.0 ms 50%   Impedance: 1690 ohms  Seizure Energy Index: 2146 V squared  Postictal Suppression Index: Less than 10%  Seizure Concordance Index: 74%  Medications  Pre Shock: Toradol 30 mg Brevital 100 mg succinylcholine 110 mg  Post Shock: Versed 4 mg Haldol 5 mg  Seizure Duration: 36 seconds by EMG 52 seconds by EEG   Comments: Follow-up Wednesday Friday and into the following week presumably for continued ECT as an outpatient  Lungs:  [x]   Clear to auscultation               []  Other:   Heart:    [x]   Regular rhythm             []  irregular rhythm    [x]   Previous H&P reviewed, patient examined and there are NO CHANGES                 []   Previous H&P reviewed, patient examined and there are changes noted.   Alethia Berthold, MD 1/7/201910:13 AM

## 2017-04-02 NOTE — Progress Notes (Signed)
Recreation Therapy Notes   Date: 01.07.2019  Time: 1:00pm  Location: Craft Room  Behavioral response: N/A  Intervention Topic: Problem Solving  Discussion/Intervention: Patient did not attend group.  Clinical Observations/Feedback:  Patient did not attend group.  Safira Proffit LRT/CTRS         Carnisha Feltz 04/02/2017 3:19 PM

## 2017-04-02 NOTE — Progress Notes (Signed)
  [facility] Adult Case Management Discharge Plan :  Will you be returning to the same living situation after discharge:  Yes,    At discharge, do you have transportation home?: Yes,    Do you have the ability to pay for your medications: Yes,     Release of information consent forms completed and in the chart;  Patient's signature needed at discharge.  Patient to Follow up at: Follow-up Information    [facility] on 05/16/2017.   Why:  1:00pm, for Hospital Follow up.  This is earliest available with Pt's chosen provider.   Contact information: [place] [address] [phone]        Gonzella Lex, MD. Go on 04/04/2017.   Specialty:  Psychiatry Why:  for ECT follow up.  ECT Nurse will call you to let you know what time that morning.   Contact information: [address] [place] [address] [phone]           Next level of care provider has access to [facility] and Suicide Prevention discussed: Yes,     Have you used any form of tobacco in the last 30 days? (Cigarettes, Smokeless Tobacco, Cigars, and/or Pipes): Yes  Has patient been referred to the Quitline?: Patient refused referral  Patient has been referred for addiction treatment: Yes  August Saucer, LCSW 04/02/2017, 3:59 PM

## 2017-04-02 NOTE — Progress Notes (Signed)
Recreation Therapy Notes  INPATIENT RECREATION TR PLAN  Patient Details Name: Michael Schmitt MRN: [id number] DOB: 1970/04/19 Today's Date: 04/02/2017  Rec Therapy Plan Is patient appropriate for Therapeutic Recreation?: Yes Treatment times per week: At least 3 Estimated Length of Stay: 5-7 days TR Treatment/Interventions: Group participation (Comment)  Discharge Criteria Pt will be discharged from therapy if:: Discharged Treatment plan/goals/alternatives discussed and agreed upon by:: Patient/family  Discharge Summary Short term goals set: Patient will identify 3 healthy leisure activities to participate in post discharge x5 days.  Short term goals met: Adequate for discharge Progress toward goals comments: Groups attended Which groups?: Stress management, Coping skills(Values) Reason goals not met: N/A Therapeutic equipment acquired: N/A Reason patient discharged from therapy: Discharge from hospital Pt/family agrees with progress & goals achieved: Yes Date patient discharged from therapy: 04/02/17   Seaira Byus 04/02/2017, 3:36 PM

## 2017-04-02 NOTE — Anesthesia Procedure Notes (Signed)
Procedure Name: General with mask airway Performed by: Hedda Slade, CRNA Pre-anesthesia Checklist: Patient identified, Emergency Drugs available, Suction available and Patient being monitored Patient Re-evaluated:Patient Re-evaluated prior to induction Oxygen Delivery Method: Circle system utilized Preoxygenation: Pre-oxygenation with 100% oxygen Induction Type: IV induction Ventilation: Mask ventilation without difficulty Airway Equipment and Method: Bite block

## 2017-04-02 NOTE — BHH Suicide Risk Assessment (Signed)
[facility] Discharge Suicide Risk Assessment   Principal Problem: Severe recurrent major depression without psychotic features [facility]) Discharge Diagnoses:  Patient Active Problem List   Diagnosis Date Noted  . Severe recurrent major depression without psychotic features ([place]) [F33.2] 03/12/2017  . Generalized anxiety disorder [F41.1] 02/05/2017  . Chronic neck pain (Primary Area of Pain) (Left) [M54.2, G89.29] 11/22/2016  . Cervical facet syndrome (Left) [Y07.371] 11/22/2016  . Cervical radiculitis (Left) [M54.12] 11/22/2016  . History of cervical spinal surgery [Z98.890] 11/22/2016  . Cervical spondylosis [M47.22] 11/22/2016  . Chronic shoulder pain (Secondary Area of Pain) (Left) [M25.512, G89.29] 11/22/2016  . 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 Chronic low back pain [facility] Area of Pain) (Bilateral) (L>R) [M54.5, G89.29] 11/22/2016  . Lumbar facet syndrome (Bilateral) (L>R) [M47.816] 11/22/2016  . Chronic pain syndrome [G89.4] 11/21/2016  . Long term prescription benzodiazepine use [Z79.899] 11/21/2016  . Opiate use [F11.90] 11/21/2016  . Uncontrolled type 2 diabetes mellitus without complication, without long-term current use of insulin ([place]) [E11.65] 10/19/2016  . Chronic Suicidal ideation [R45.851] 09/15/2016  . Coronary artery disease [I25.10] 09/12/2016  . HTN (hypertension) [I10] 06/26/2016  . GERD (gastroesophageal reflux disease) [K21.9] 06/26/2016  . Alcohol use disorder, severe, 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[place]) [F10.21] 06/26/2016  . Moderate benzodiazepine use disorder ([place]) [F13.20] 06/11/2016  . Sedative, hypnotic or anxiolytic use disorder, severe, dependence ([place]) [F13.20] 05/12/2016  . Ventricular fibrillation ([place]) [I49.01] 04/05/2016  . 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 Cervical spondylosis without myelopathy [M47.812] 07/24/2014  . Severe episode of recurrent major depressive disorder, without psychotic features ([place]) [F33.2] 04/15/2013  .  Tobacco use disorder [F17.200] 10/20/2010  . Asthma [J45.909] 10/20/2010    Total Time spent with patient: 30 minutes  Musculoskeletal: Strength & Muscle Tone: within normal limits Gait & Station: normal Patient leans: N/A  Psychiatric Specialty Exam: Review of Systems  Constitutional: Negative.   HENT: Negative.   Eyes: Negative.   Respiratory: Negative.   Cardiovascular: Negative.   Gastrointestinal: Negative.   Musculoskeletal: Negative.   Skin: Negative.   Neurological: Negative.   Psychiatric/Behavioral: Positive for depression. Negative for hallucinations, memory loss, substance abuse and suicidal ideas. The patient is not nervous/anxious and does not have insomnia.     Blood pressure 126/86, pulse (!) 59, temperature 97.9 F (36.6 C), resp. rate 13, height 6\' 2"  (1.88 m), weight 91.2 kg (201 lb), SpO2 99 %.Body mass index is 25.81 kg/m.  General Appearance: Casual  Eye Contact::  Good  Speech:  Slow409  Volume:  Decreased  Mood:  Euthymic  Affect:  Constricted  Thought Process:  Goal Directed  Orientation:  Full (Time, Place, and Person)  Thought Content:  Logical  Suicidal Thoughts:  No  Homicidal Thoughts:  No  Memory:  Immediate;   Fair Recent;   Fair Remote;   Fair  Judgement:  Fair  Insight:  Fair  Psychomotor Activity:  Normal  Concentration:  Fair  Recall:  [employer] of Knowledge:Fair  Language: Fair  Akathisia:  No  Handed:  Right  AIMS (if indicated):     Assets:  Communication Skills Desire for Improvement Housing Resilience Social Support  Sleep:  Number of Hours: 7.5  Cognition: WNL  ADL's:  Intact   Mental Status Per Nursing Assessment::   On Admission:     Demographic Factors:  Male, Divorced or widowed, Caucasian and Unemployed  Loss Factors: Decrease in vocational status, Decline in physical health and Financial problems/change in socioeconomic status  Historical Factors:  Prior suicide attempts, Family history of mental  illness or substance abuse and Impulsivity  Risk Reduction Factors:   Responsible for children under 32 years of age, Sense of responsibility to family, Religious beliefs about death, Positive social support and Positive therapeutic relationship  Continued Clinical Symptoms:  Depression:   Anhedonia  Cognitive Features That Contribute To Risk:  Closed-mindedness    Suicide Risk:  Mild:  Suicidal ideation of limited frequency, intensity, duration, and specificity.  There are no identifiable plans, no associated intent, mild dysphoria and related symptoms, good self-control (both objective and subjective assessment), few other risk factors, and identifiable protective factors, including available and accessible social support.  Follow-up Information    [facility] [employer] Follow up.   Why:  Assessments to establish care are Walk-In Only,  Monday-Friday from 9AM-4PM.  Please bring your insurance card and medication list with you. Contact information: [place] [address] [phone]           Plan Of Care/Follow-up recommendations:  Activity:  as tolerated Diet:  low ccarb Other:  ECT follow up and also with regular psychiatric provider  Alethia Berthold, MD 04/02/2017, 1:09 PM

## 2017-04-02 NOTE — BHH Group Notes (Signed)
04/02/2017 9:30AM  Type of Therapy and Topic:  Group Therapy:  Overcoming Obstacles  Participation Level:  Did Not Attend    Description of Group:    In this group patients will be encouraged to explore what they see as obstacles to their own wellness and recovery. They will be guided to discuss their thoughts, feelings, and behaviors related to these obstacles. The group will process together ways to cope with barriers, with attention given to specific choices patients can make. Each patient will be challenged to identify changes they are motivated to make in order to overcome their obstacles. This group will be process-oriented, with patients participating in exploration of their own experiences as well as giving and receiving support and challenge from other group members.   Therapeutic Goals: 1. Patient will identify personal and current obstacles as they relate to admission. 2. Patient will identify barriers that currently interfere with their wellness or overcoming obstacles.  3. Patient will identify feelings, thought process and behaviors related to these barriers. 4. Patient will identify two changes they are willing to make to overcome these obstacles:      Summary of Patient Progress Patient was encouraged and invited to attend group. Patient did not attend group. Social worker will continue to encourage group participation in the future.     Therapeutic Modalities:   Cognitive Behavioral Therapy Solution Focused Therapy Motivational Interviewing Relapse Prevention Therapy    Darin Engels MSW, [place] 04/02/2017 10:33 AM

## 2017-04-02 NOTE — Anesthesia Preprocedure Evaluation (Signed)
Anesthesia Evaluation  Patient identified by MRN, date of birth, ID band Patient awake    Reviewed: Allergy & Precautions, H&P , NPO status , Patient's Chart, lab work & pertinent test results  History of Anesthesia Complications Negative for: history of anesthetic complications  Airway Mallampati: III  TM Distance: <3 FB Neck ROM: limited    Dental  (+) Chipped, Poor Dentition, Missing   Pulmonary shortness of breath and with exertion, asthma , Current Smoker,           Cardiovascular Exercise Tolerance: Poor hypertension, (-) angina+ CAD and + Past MI  + dysrhythmias      Neuro/Psych  Headaches, PSYCHIATRIC DISORDERS  Neuromuscular disease    GI/Hepatic Neg liver ROS, GERD  Controlled,  Endo/Other  diabetes, Type 2  Renal/GU Renal disease  negative genitourinary   Musculoskeletal  (+) Arthritis ,   Abdominal   Peds  Hematology  (+) Blood dyscrasia, ,   Anesthesia Other Findings Past Medical History: No date: Anxiety No date: Anxiety No date: Arthritis     Comment:  cerv. stenosis, spondylosis, HNP- lower back , has been               followed in pain clinic, has  had injection s in cerv.               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               Cervical three-four. 01/01/2015: CARDIAC CATHETERIZATION; N/A     Comment:  Procedure: Left Heart Cath and Coronary Angiography;                Surgeon: [name], MD;  Location: [facility]               LAB;  Service: Cardiovascular;  Laterality: N/A; 04/05/2016: CARDIAC CATHETERIZATION; N/A     Comment:  Procedure: Left Heart Cath and Coronary Angiography;                Surgeon: Belva Crome, MD;  Location: [facility]               LAB;  Service: Cardiovascular;  Laterality: N/A; 04/05/2016: CARDIAC CATHETERIZATION; N/A     Comment:  Procedure: Coronary Stent Intervention;  Surgeon: Mallie Mussel  Nicholes Stairs, MD;  Location: [facility] LAB;  Service:               Cardiovascular;  Laterality: N/A; 04/05/2016: CARDIAC CATHETERIZATION; N/A     Comment:  Procedure: Intravascular Ultrasound/IVUS;  Surgeon:               Belva Crome, MD;  Location: [facility] LAB;                Service: Cardiovascular;  Laterality: N/A; No date: NASAL SINUS SURGERY     Comment:  2005  BMI    Body Mass Index:  25.94 kg/m      Reproductive/Obstetrics negative OB ROS                             Anesthesia Physical  Anesthesia Plan  ASA: III  Anesthesia Plan: General   Post-op Pain Management:    Induction: Intravenous  PONV Risk Score and Plan:   Airway Management Planned: Natural Airway and Mask  Additional Equipment:   Intra-op Plan:   Post-operative Plan:   Informed Consent: I have reviewed the patients History and Physical, chart, labs and discussed the procedure  including the risks, benefits and alternatives for the proposed anesthesia with the patient or authorized representative who has indicated his/her understanding and acceptance.   Dental Advisory Given  Plan Discussed with: Anesthesiologist, CRNA and Surgeon  Anesthesia Plan Comments: (Patient consented for risks of anesthesia including but not limited to:  - adverse reactions to medications - risk of intubation if required - damage to teeth, lips or other oral mucosa - sore throat or hoarseness - Damage to heart, brain, lungs or loss of life  Patient voiced understanding.)        Anesthesia Quick Evaluation

## 2017-04-03 ENCOUNTER — Other Ambulatory Visit: Payer: Self-pay | Admitting: Psychiatry

## 2017-04-03 NOTE — Anesthesia Postprocedure Evaluation (Signed)
Anesthesia Post Note  Patient: Abdikadir Fohl  Procedure(s) Performed: ECT TX  Patient location during evaluation: PACU Anesthesia Type: General Level of consciousness: awake and alert Pain management: pain level controlled Vital Signs Assessment: post-procedure vital signs reviewed and stable Respiratory status: spontaneous breathing, nonlabored ventilation, respiratory function stable and patient connected to nasal cannula oxygen Cardiovascular status: blood pressure returned to baseline and stable Postop Assessment: no apparent nausea or vomiting Anesthetic complications: no     Last Vitals:  Vitals:   04/02/17 1050 04/02/17 1058  BP: 123/81 126/86  Pulse: 72 (!) 59  Resp: 13 13  Temp:  36.6 C  SpO2: 97% 99%    Last Pain:  Vitals:   04/02/17 1320  TempSrc:   PainSc: 4                  Cheyeanne Roadcap K Makael Stein

## 2017-04-03 NOTE — Care Management Utilization Note (Signed)
Updated contact information for [name] called into [facility] scheduling for Peer to Peer review. [name] made aware of scheduled review and given contact information for Dr. Nelva Bush at [facility].

## 2017-04-04 ENCOUNTER — Ambulatory Visit: Payer: Medicare Other | Attending: Psychiatry

## 2017-04-04 ENCOUNTER — Inpatient Hospital Stay
Admission: AD | Admit: 2017-04-04 | Discharge: 2017-04-16 | DRG: 885 | Disposition: A | Discharge status: Home / Self Care | Payer: Medicare Other | Source: Intra-hospital | Attending: Psychiatry | Admitting: Psychiatry

## 2017-04-04 ENCOUNTER — Other Ambulatory Visit: Payer: Self-pay

## 2017-04-04 DIAGNOSIS — K219 Gastro-esophageal reflux disease without esophagitis: Secondary | ICD-10-CM | POA: Diagnosis present

## 2017-04-04 DIAGNOSIS — E1122 Type 2 diabetes mellitus with diabetic chronic kidney disease: Secondary | ICD-10-CM | POA: Diagnosis present

## 2017-04-04 DIAGNOSIS — E1165 Type 2 diabetes mellitus with hyperglycemia: Secondary | ICD-10-CM | POA: Diagnosis present

## 2017-04-04 DIAGNOSIS — Z87442 Personal history of urinary calculi: Secondary | ICD-10-CM | POA: Diagnosis not present

## 2017-04-04 DIAGNOSIS — E782 Mixed hyperlipidemia: Secondary | ICD-10-CM | POA: Diagnosis present

## 2017-04-04 DIAGNOSIS — G894 Chronic pain syndrome: Secondary | ICD-10-CM | POA: Diagnosis present

## 2017-04-04 DIAGNOSIS — M129 Arthropathy, unspecified: Secondary | ICD-10-CM | POA: Diagnosis present

## 2017-04-04 DIAGNOSIS — F17210 Nicotine dependence, cigarettes, uncomplicated: Secondary | ICD-10-CM | POA: Diagnosis present

## 2017-04-04 DIAGNOSIS — G4700 Insomnia, unspecified: Secondary | ICD-10-CM | POA: Diagnosis present

## 2017-04-04 DIAGNOSIS — F1021 Alcohol dependence, in remission: Secondary | ICD-10-CM | POA: Diagnosis present

## 2017-04-04 DIAGNOSIS — I129 Hypertensive chronic kidney disease with stage 1 through stage 4 chronic kidney disease, or unspecified chronic kidney disease: Secondary | ICD-10-CM | POA: Diagnosis present

## 2017-04-04 DIAGNOSIS — I252 Old myocardial infarction: Secondary | ICD-10-CM

## 2017-04-04 DIAGNOSIS — Z915 Personal history of self-harm: Secondary | ICD-10-CM

## 2017-04-04 DIAGNOSIS — Z7982 Long term (current) use of aspirin: Secondary | ICD-10-CM

## 2017-04-04 DIAGNOSIS — K760 Fatty (change of) liver, not elsewhere classified: Secondary | ICD-10-CM | POA: Diagnosis present

## 2017-04-04 DIAGNOSIS — Z79899 Other long term (current) drug therapy: Secondary | ICD-10-CM

## 2017-04-04 DIAGNOSIS — Z818 Family history of other mental and behavioral disorders: Secondary | ICD-10-CM | POA: Diagnosis not present

## 2017-04-04 DIAGNOSIS — Z7902 Long term (current) use of antithrombotics/antiplatelets: Secondary | ICD-10-CM

## 2017-04-04 DIAGNOSIS — Z885 Allergy status to narcotic agent status: Secondary | ICD-10-CM | POA: Diagnosis not present

## 2017-04-04 DIAGNOSIS — N189 Chronic kidney disease, unspecified: Secondary | ICD-10-CM | POA: Diagnosis present

## 2017-04-04 DIAGNOSIS — Z888 Allergy status to other drugs, medicaments and biological substances status: Secondary | ICD-10-CM

## 2017-04-04 DIAGNOSIS — R45851 Suicidal ideations: Secondary | ICD-10-CM | POA: Diagnosis present

## 2017-04-04 DIAGNOSIS — F410 Panic disorder [episodic paroxysmal anxiety] without agoraphobia: Secondary | ICD-10-CM | POA: Diagnosis present

## 2017-04-04 DIAGNOSIS — F332 Major depressive disorder, recurrent severe without psychotic features: Secondary | ICD-10-CM | POA: Diagnosis present

## 2017-04-04 DIAGNOSIS — F411 Generalized anxiety disorder: Secondary | ICD-10-CM | POA: Diagnosis present

## 2017-04-04 DIAGNOSIS — I2510 Atherosclerotic heart disease of native coronary artery without angina pectoris: Secondary | ICD-10-CM | POA: Diagnosis present

## 2017-04-04 DIAGNOSIS — J45909 Unspecified asthma, uncomplicated: Secondary | ICD-10-CM | POA: Diagnosis present

## 2017-04-04 DIAGNOSIS — E119 Type 2 diabetes mellitus without complications: Secondary | ICD-10-CM | POA: Diagnosis present

## 2017-04-04 DIAGNOSIS — Z7984 Long term (current) use of oral hypoglycemic drugs: Secondary | ICD-10-CM

## 2017-04-04 DIAGNOSIS — G2581 Restless legs syndrome: Secondary | ICD-10-CM | POA: Diagnosis present

## 2017-04-04 MED ORDER — QUETIAPINE FUMARATE 100 MG PO TABS
100.0000 mg | ORAL_TABLET | Freq: Every day | ORAL | Status: DC
  Administered 2017-04-04 – 2017-04-09 (×6): 100 mg via ORAL
  Filled 2017-04-04 (×6): qty 1

## 2017-04-04 MED ORDER — ALUM & MAG HYDROXIDE-SIMETH 200-200-20 MG/5ML PO SUSP: 30.0000 mL | ORAL | Status: DC | PRN

## 2017-04-04 MED ORDER — MAGNESIUM HYDROXIDE 400 MG/5ML PO SUSP: 30.0000 mL | Freq: Every day | ORAL | Status: DC | PRN

## 2017-04-04 MED ORDER — ATORVASTATIN CALCIUM 80 MG PO TABS
80.0000 mg | ORAL_TABLET | Freq: Every day | ORAL | Status: DC
  Administered 2017-04-04 – 2017-04-15 (×12): 80 mg via ORAL
  Filled 2017-04-04 (×4): qty 4
  Filled 2017-04-04 (×2): qty 1
  Filled 2017-04-04 (×3): qty 4
  Filled 2017-04-04 (×3): qty 1
  Filled 2017-04-04: qty 4
  Filled 2017-04-04 (×2): qty 1
  Filled 2017-04-04: qty 4
  Filled 2017-04-04: qty 1
  Filled 2017-04-04 (×2): qty 4
  Filled 2017-04-04: qty 1
  Filled 2017-04-04: qty 4

## 2017-04-04 MED ORDER — CLOPIDOGREL BISULFATE 75 MG PO TABS
75.0000 mg | ORAL_TABLET | Freq: Every day | ORAL | Status: DC
  Administered 2017-04-04 – 2017-04-16 (×13): 75 mg via ORAL
  Filled 2017-04-04 (×13): qty 1

## 2017-04-04 MED ORDER — LURASIDONE HCL 40 MG PO TABS: 80.0000 mg | ORAL_TABLET | Freq: Every day | ORAL | Status: DC

## 2017-04-04 MED ORDER — PRAZOSIN HCL 2 MG PO CAPS
2.0000 mg | ORAL_CAPSULE | Freq: Every day | ORAL | Status: DC
  Administered 2017-04-04 – 2017-04-15 (×12): 2 mg via ORAL
  Filled 2017-04-04 (×13): qty 1

## 2017-04-04 MED ORDER — HYDROXYZINE HCL 50 MG PO TABS
50.0000 mg | ORAL_TABLET | Freq: Three times a day (TID) | ORAL | Status: DC | PRN
Start: 2017-04-04 — End: 2017-04-16
  Administered 2017-04-07 – 2017-04-13 (×3): 50 mg via ORAL
  Filled 2017-04-04 (×3): qty 1

## 2017-04-04 MED ORDER — ESCITALOPRAM OXALATE 10 MG PO TABS
20.0000 mg | ORAL_TABLET | Freq: Every day | ORAL | Status: DC
  Administered 2017-04-04 – 2017-04-16 (×13): 20 mg via ORAL
  Filled 2017-04-04 (×13): qty 2

## 2017-04-04 MED ORDER — ACETAMINOPHEN 325 MG PO TABS
650.0000 mg | ORAL_TABLET | Freq: Four times a day (QID) | ORAL | Status: DC | PRN
  Administered 2017-04-06 – 2017-04-13 (×4): 650 mg via ORAL
  Filled 2017-04-04 (×4): qty 2

## 2017-04-04 MED ORDER — TRAZODONE HCL 100 MG PO TABS
100.0000 mg | ORAL_TABLET | Freq: Every evening | ORAL | Status: DC | PRN
  Administered 2017-04-04 – 2017-04-14 (×11): 100 mg via ORAL
  Filled 2017-04-04 (×10): qty 1

## 2017-04-04 MED ORDER — CARVEDILOL 6.25 MG PO TABS
6.2500 mg | ORAL_TABLET | Freq: Two times a day (BID) | ORAL | Status: DC
  Administered 2017-04-04 – 2017-04-16 (×24): 6.25 mg via ORAL
  Filled 2017-04-04 (×24): qty 1

## 2017-04-04 MED ORDER — METFORMIN HCL 500 MG PO TABS
500.0000 mg | ORAL_TABLET | Freq: Two times a day (BID) | ORAL | Status: DC
  Administered 2017-04-04 – 2017-04-16 (×23): 500 mg via ORAL
  Filled 2017-04-04 (×24): qty 1

## 2017-04-04 MED ORDER — MIRTAZAPINE 15 MG PO TABS
30.0000 mg | ORAL_TABLET | Freq: Every day | ORAL | Status: DC
  Administered 2017-04-04 – 2017-04-10 (×7): 30 mg via ORAL
  Filled 2017-04-04 (×7): qty 2

## 2017-04-04 NOTE — Tx Team (Signed)
Initial Treatment Plan 04/04/2017 1:17 PM Michael Schmitt [id number]    PATIENT STRESSORS: Legal issue Medication change or noncompliance   PATIENT STRENGTHS: Average or above average intelligence Communication skills Supportive family/friends   PATIENT IDENTIFIED PROBLEMS: Major depression 04/04/2017  Suicidal ideations 04/04/2017                   DISCHARGE CRITERIA:  Ability to meet basic life and health needs Adequate post-discharge living arrangements Medical problems require only outpatient monitoring  PRELIMINARY DISCHARGE PLAN: Attend aftercare/continuing care group Return to previous living arrangement  PATIENT/FAMILY INVOLVEMENT: This treatment plan has been presented to and reviewed with the patient, Michael Schmitt, and/or family member, .  The patient and family have been given the opportunity to ask questions and make suggestions.  Merlene Morse, RN 04/04/2017, 1:17 PM

## 2017-04-04 NOTE — BHH Group Notes (Signed)
[facility] Group Notes:  (Nursing/MHT/Case Management/Adjunct)  Date:  04/04/2017  Time:  3:32 PM  Type of Therapy:  Psychoeducational Skills  Participation Level:  Minimal  Participation Quality:  Attentive  Affect:  Appropriate  Cognitive:  Appropriate  Insight:  Appropriate  Engagement in Group:  Engaged  Modes of Intervention:  Discussion and Education  Summary of Progress/Problems:  Michael Schmitt 04/04/2017, 3:32 PM

## 2017-04-04 NOTE — BHH Suicide Risk Assessment (Signed)
[facility] Admission Suicide Risk Assessment   Nursing information obtained from:   Review of nursing notes and discussion with unit nursing Demographic factors:   Male, single, unemployed, past history of suicide attempts Current Mental Status:   Agitated depressed hopeless not clearly psychotic but having suicidal thoughts Loss Factors:   Chronic frustration with loss of his function Historical Factors:   Chronic depression multiple hospitalizations positive past suicide attempts Risk Reduction Factors:   Appreciation for his children and family  Total Time spent with patient: 45 minutes Principal Problem: Severe recurrent major depression without psychotic features ([place]) Diagnosis:   Patient Active Problem List   Diagnosis Date Noted  . Severe recurrent major depression without psychotic features ([place]) [F33.2] 03/12/2017    Priority: High  . Generalized anxiety disorder [F41.1] 02/05/2017    Priority: High  . Chronic Suicidal ideation [R45.851] 09/15/2016    Priority: High  . Chronic pain syndrome [G89.4] 11/21/2016    Priority: Medium  . Uncontrolled type 2 diabetes mellitus without complication, without long-term current use of insulin ([place]) [E11.65] 10/19/2016    Priority: Medium  . Coronary artery disease [I25.10] 09/12/2016    Priority: Medium  . Combined hyperlipidemia [E78.2] 01/05/2016    Priority: Low  . Chronic neck pain (Primary Area of Pain) (Left) [M54.2, G89.29] 11/22/2016  . Cervical facet syndrome (Left) [I95.188] 11/22/2016  . Cervical radiculitis (Left) [M54.12] 11/22/2016  . History of cervical spinal surgery [Z98.890] 11/22/2016  . Cervical spondylosis [M47.22] 11/22/2016  . Chronic shoulder pain (Secondary Area of Pain) (Left) [M25.512, G89.29] 11/22/2016  . 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 Chronic low back pain [facility] Area of Pain) (Bilateral) (L>R) [M54.5, G89.29] 11/22/2016  . Lumbar facet syndrome (Bilateral) (L>R) [M47.816]  11/22/2016  . Long term prescription benzodiazepine use [Z79.899] 11/21/2016  . Opiate use [F11.90] 11/21/2016  . HTN (hypertension) [I10] 06/26/2016  . GERD (gastroesophageal reflux disease) [K21.9] 06/26/2016  . Alcohol use disorder, severe, 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[place]) [F10.21] 06/26/2016  . Moderate benzodiazepine use disorder ([place]) [F13.20] 06/11/2016  . Sedative, hypnotic or anxiolytic use disorder, severe, dependence ([place]) [F13.20] 05/12/2016  . Ventricular fibrillation ([place]) [I49.01] 04/05/2016  . Cervical spondylosis without myelopathy [M47.812] 07/24/2014  . Severe episode of recurrent major depressive disorder, without psychotic features ([place]) [F33.2] 04/15/2013  . Tobacco use disorder [F17.200] 10/20/2010  . Asthma [J45.909] 10/20/2010   Subjective Data: Patient is currently having suicidal ideation but has no intention to act on it on the unit.  He has been cooperative with the unit plan and is able to express some tentative optimism when discussing the treatment plan.  No physical complaints currently.  Continued Clinical Symptoms:  Alcohol Use Disorder Identification Test Final Score (AUDIT): 1 The "Alcohol Use Disorders Identification Test", Guidelines for Use in Primary Care, Second Edition.  World Pharmacologist [facility]). Score between 0-7:  no or low risk or alcohol related problems. Score between 8-15:  moderate risk of alcohol related problems. Score between 16-19:  high risk of alcohol related problems. Score 20 or above:  warrants further diagnostic evaluation for alcohol dependence and treatment.   CLINICAL FACTORS:   Severe Anxiety and/or Agitation Panic Attacks Depression:   Aggression Anhedonia Hopelessness Impulsivity   Musculoskeletal: Strength & Muscle Tone: within normal limits Gait & Station: normal Patient leans: N/A  Psychiatric Specialty Exam: Physical Exam  Nursing note and vitals reviewed. Constitutional: He appears well-developed and  well-nourished.  HENT:  Head: Normocephalic and atraumatic.  Eyes: Conjunctivae are normal.  Pupils are equal, round, and reactive to light.  Neck: Normal range of motion.  Cardiovascular: Regular rhythm and normal heart sounds.  Respiratory: Effort normal. No respiratory distress.  GI: Soft.  Musculoskeletal: Normal range of motion.  Neurological: He is alert.  Skin: Skin is warm and dry.  Psychiatric: His mood appears anxious. His speech is delayed. He is slowed. He expresses impulsivity. He exhibits a depressed mood. He expresses suicidal ideation. He expresses suicidal plans. He exhibits abnormal recent memory.    Review of Systems  Constitutional: Negative.   HENT: Negative.   Eyes: Negative.   Respiratory: Negative.   Cardiovascular: Negative.   Gastrointestinal: Negative.   Musculoskeletal: Negative.   Skin: Negative.   Neurological: Negative.   Psychiatric/Behavioral: Positive for depression and suicidal ideas. Negative for hallucinations, memory loss and substance abuse. The patient is nervous/anxious and has insomnia.     Blood pressure 124/88, pulse 82, temperature 98.2 F (36.8 C), temperature source Oral, resp. rate 18, height 6\' 2"  (1.88 m), weight 92.5 kg (204 lb), SpO2 99 %.Body mass index is 26.19 kg/m.  General Appearance: Casual  Eye Contact:  Fair  Speech:  Slow  Volume:  Decreased  Mood:  Depressed  Affect:  Congruent  Thought Process:  Coherent  Orientation:  Full (Time, Place, and Person)  Thought Content:  Logical  Suicidal Thoughts:  Yes.  with intent/plan  Homicidal Thoughts:  No  Memory:  Immediate;   Fair Recent;   Fair Remote;   Fair  Judgement:  Impaired  Insight:  Shallow  Psychomotor Activity:  Decreased  Concentration:  Concentration: Fair  Recall:  [employer] of Knowledge:  Fair  Language:  Fair  Akathisia:  No  Handed:  Right  AIMS (if indicated):     Assets:  Communication Skills Desire for  Improvement Housing Resilience Social Support  ADL's:  Intact  Cognition:  WNL  Sleep:         COGNITIVE FEATURES THAT CONTRIBUTE TO RISK:  Closed-mindedness, Polarized thinking and Thought constriction (tunnel vision)    SUICIDE RISK:   Moderate:  Frequent suicidal ideation with limited intensity, and duration, some specificity in terms of plans, no associated intent, good self-control, limited dysphoria/symptomatology, some risk factors present, and identifiable protective factors, including available and accessible social support.  PLAN OF CARE: Patient is readmitted to the psychiatric ward.  On 15-minute checks.  Medication changes as noted above.  Continue plan for ECT.  I certify that inpatient services furnished can reasonably be expected to improve the patient's condition.   Alethia Berthold, MD 04/04/2017, 4:33 PM

## 2017-04-04 NOTE — Progress Notes (Signed)
Patient is sad but cooperative during admission assessment. Patient denies SI/HI at this time. Patient denies AVH. Patient informed of fall risk status, fall risk assessed "low" at this time. Patient oriented to unit/staff/room. Patient denies any questions/concerns at this time. Patient safe on unit with Q15 minute checks for safety. Skin assessment & body search done.No contraband found.

## 2017-04-04 NOTE — Progress Notes (Signed)
Recreation Therapy Notes   Date: 01.09.2019  Time: 1:00PM  Location: Craft Room  Behavioral response: Appropriate  Intervention Topic: Creative Expressions  Discussion/Intervention: Group content on today was focused on creative expressions. The group defined creative expressions and ways they use creative expressions. Individual identified other positive ways creative expressions can be used and why it is important to express yourself. Patients participated in the intervention "learning origami", where they had a chance to learn new ways to creatively express themselves. Clinical Observations/Feedback:  Patient came to group and was focused on what his peers and staff had to say about the topic at hand. He participated in the intervention and was social with peers during group. Blue Ruggerio LRT/CTRS         Javana Schey 04/04/2017 2:30 PM

## 2017-04-04 NOTE — Progress Notes (Signed)
Patient arrived at his appointment with his sister and his 45 year old son 4 hours late. When this nurse stated that he was unable to receive ECT due to the lateness of his appointment (8:15 am). The patient became belligerent. This nurse at once took him to a private consult room and asked him to wait as I went to get [name]. As this nurse was going down the hall I could hear the patient getting louder yelling. By the time [name] and I had arrived Security had been called to subdue the patient. The sister to the patient stated that he could not go home like this and he needed to stay in the hospital. The patient agreed to staying in the hospital. Process for admitting the patient to Behavioral Medicine implemented.
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
## 2017-04-04 NOTE — [facility] Group Notes (Signed)
[facility] Group Notes:  (Nursing/MHT/Case Management/Adjunct)  Date:  04/04/2017  Time:  11:49 PM  Type of Therapy:  Group Therapy  Participation Level:  Active  Participation Quality:  Appropriate  Affect:  Appropriate  Cognitive:  Alert  Insight:  Good  Engagement in Group:  Engaged  Modes of Intervention:  Support  Summary of Progress/Problems:  Michael Schmitt 04/04/2017, 11:49 PM

## 2017-04-04 NOTE — BH Assessment (Signed)
Patient is to be admitted to [facility] by [name].  Attending Physician will be [name].   Patient has been assigned to room 322, by [place]

## 2017-04-04 NOTE — Plan of Care (Signed)
  Progressing Activity: Interest or engagement in activities will improve 04/04/2017 2209 - Progressing by Clemens Catholic, RN Sleeping patterns will improve 04/04/2017 2209 - Progressing by Clemens Catholic, RN Education: Knowledge of [facility] Education information/materials will improve 04/04/2017 2209 - Progressing by Clemens Catholic, RN Mental status will improve 04/04/2017 2209 - Progressing by Clemens Catholic, RN Coping: Ability to verbalize frustrations and anger appropriately will improve 04/04/2017 2209 - Progressing by Clemens Catholic, RN Health Behavior/Discharge Planning: Ability to make decisions will improve 04/04/2017 2209 - Progressing by Clemens Catholic, RN Safety: Ability to disclose and discuss suicidal ideas will improve 04/04/2017 2209 - Progressing by Clemens Catholic, RN Self-Concept: Ability to verbalize positive feelings about self will improve 04/04/2017 2209 - Progressing by Clemens Catholic, RN

## 2017-04-05 ENCOUNTER — Other Ambulatory Visit: Payer: Self-pay | Admitting: Psychiatry

## 2017-04-05 NOTE — Progress Notes (Signed)
Recreation Therapy Notes   Date: 01.10.2019  Time: 1:00 PM  Location: Craft Room  Behavioral response: Appropriate  Intervention Topic: Stress  Discussion/Intervention: Group content on today was focused on stress. The group defined stress and way to cope with stress. Participants expressed how they know when they are stresses out. Individuals described the different ways they have to cope with stress. The group stated reasons why it is important to cope with stress. Patient explained what good stress is and some examples. The group participated in the intervention "Dealing with stress". Individuals had a chance to discuss positive and negative ways to deal with certain stressful situations.  Clinical Observations/Feedback:  Patient came to group and was focused on the topic at hand. Individual continues to work on his goal. Pharmacist, community Rani Sisney LRT/CTRS            Yeshaya Vath 04/05/2017 1:53 PM

## 2017-04-05 NOTE — Progress Notes (Signed)
Patient reports that depression symptoms continues, they are unchanged in frequency and intensity, that symptoms occur  daily with difficulty concentrating, also described feeling sad, sleep difficulties has worsened and feeling of worthlessness is described, patient denies suicide ideation or intentions, support and encouragement is rendered and acknowledged , patient is compliance with his medicines as scheduled , peer activities is intermittently observed no distress noted  15 minute check is in progress for safety.
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
## 2017-04-05 NOTE — Plan of Care (Signed)
  Progressing Activity: Sleeping patterns will improve 04/05/2017 2040 - Progressing by Clemens Catholic, RN Education: Emotional status will improve 04/05/2017 2040 - Progressing by Clemens Catholic, RN Mental status will improve 04/05/2017 2040 - Progressing by Clemens Catholic, RN Coping: Ability to verbalize frustrations and anger appropriately will improve 04/05/2017 2040 - Progressing by Clemens Catholic, RN Health Behavior/Discharge Planning: Compliance with therapeutic regimen will improve 04/05/2017 2040 - Progressing by Clemens Catholic, RN

## 2017-04-05 NOTE — BHH Group Notes (Signed)
  04/05/2017  Time: 0900  Type of Therapy and Topic:  Group Therapy:  Setting Goals Participation Level:  Did Not Attend  Description of Group: In this process group, patients discussed using strengths to work toward goals and address challenges.  Patients identified two positive things about themselves and one goal they were working on.  Patients were given the opportunity to share openly and support each other's plan for self-empowerment.  The group discussed the value of gratitude and were encouraged to have a daily reflection of positive characteristics or circumstances.  Patients were encouraged to identify a plan to utilize their strengths to work on current challenges and goals.  Therapeutic Goals 1. Patient will verbalize personal strengths/positive qualities and relate how these can assist with achieving desired personal goals 2. Patients will verbalize affirmation of peers plans for personal change and goal setting 3. Patients will explore the value of gratitude and positive focus as related to successful achievement of goals 4. Patients will verbalize a plan for regular reinforcement of personal positive qualities and circumstances.  Summary of Patient Progress: Pt was invited to attend group but chose not to attend. CSW will continue to encourage pt to attend group throughout their admission.    Therapeutic Modalities Cognitive Behavioral Therapy Motivational Interviewing  Alden Hipp, MSW, LCSW 04/05/2017 9:40 AM

## 2017-04-05 NOTE — BHH Group Notes (Signed)
[facility] Group Notes:  (Nursing/MHT/Case Management/Adjunct)  Date:  04/05/2017  Time:  6:13 PM  Type of Therapy:  Psychoeducational Skills  Participation Level:  Active  Participation Quality:  Appropriate and Attentive  Affect:  Appropriate  Cognitive:  Alert and Appropriate  Insight:  Appropriate and Good  Engagement in Group:  Engaged  Modes of Intervention:  Discussion and Education  Summary of Progress/Problems:  Michael Schmitt 04/05/2017, 6:13 PM
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
## 2017-04-05 NOTE — Plan of Care (Signed)
Continues to endorse depression, anxiety and hopelessness.  Rates each one as a 10/10.  No group attendance.  Isolative.  [place] halls.  No initiation of conversations.  Unable to verbalize any coping skills.  Verbalizing that medications nor ECT is working.  When asked what he could do to help with his mood, states "I don't know"  Support offered.  Safety maintained on the unit.

## 2017-04-05 NOTE — BHH Suicide Risk Assessment (Signed)
[place] INPATIENT:  Family/Significant Other Suicide Prevention Education  Suicide Prevention Education:  Contact Attempts: Tavita Eastham, Sister, [phone] has been identified by the patient as the family member/significant other with whom the patient will be residing, and identified as the person(s) who will aid the patient in the event of a mental health crisis.  With written consent from the patient, two attempts were made to provide suicide prevention education, prior to and/or following the patient's discharge.  We were unsuccessful in providing suicide prevention education.  A suicide education pamphlet was given to the patient to share with family/significant other.  Date and time of first attempt:04/05/2017 at 11am Date and time of second attempt:  Darin Engels 04/05/2017, 11:02 AM

## 2017-04-05 NOTE — [facility] Group Notes (Signed)
04/05/2017 9:30AM  Type of Therapy/Topic:  Group Therapy:  Balance in Life  Participation Level:  Active  Description of Group:   This group will address the concept of balance and how it feels and looks when one is unbalanced. Patients will be encouraged to process areas in their lives that are out of balance and identify reasons for remaining unbalanced. Facilitators will guide patients in utilizing problem-solving interventions to address and correct the stressor making their life unbalanced. Understanding and applying boundaries will be explored and addressed for obtaining and maintaining a balanced life. Patients will be encouraged to explore ways to assertively make their unbalanced needs known to significant others in their lives, using other group members and facilitator for support and feedback.  Therapeutic Goals: 1. Patient will identify two or more emotions or situations they have that consume much of in their lives. 2. Patient will identify signs/triggers that life has become out of balance:  3. Patient will identify two ways to set boundaries in order to achieve balance in their lives:  4. Patient will demonstrate ability to communicate their needs through discussion and/or role plays  Summary of Patient Progress: Actively and appropriately engaged in the group. Patient was able to provide support and validation to other group members.Patient practiced active listening when interacting with the facilitator and other group members Patient in still in the process of obtaining treatment goals. Michael Schmitt says that his feels that his boundaries are healthy because he knows when enough is enough.        Therapeutic Modalities:   Cognitive Behavioral Therapy Solution-Focused Therapy Assertiveness Training  Darin Engels, [place]

## 2017-04-06 ENCOUNTER — Inpatient Hospital Stay: Payer: Medicare Other

## 2017-04-06 ENCOUNTER — Inpatient Hospital Stay: Payer: Medicare Other | Admitting: Anesthesiology

## 2017-04-06 ENCOUNTER — Encounter: Payer: Self-pay | Admitting: Anesthesiology

## 2017-04-06 LAB — GLUCOSE, CAPILLARY
GLUCOSE-CAPILLARY: 147 mg/dL — AB (ref 65–99)
Glucose-Capillary: 131 mg/dL — ABNORMAL HIGH (ref 65–99)

## 2017-04-06 MED ORDER — HALOPERIDOL LACTATE 5 MG/ML IJ SOLN
5.0000 mg | Freq: Once | INTRAMUSCULAR | Status: DC
  Filled 2017-04-06: qty 1

## 2017-04-06 MED ORDER — KETOROLAC TROMETHAMINE 30 MG/ML IJ SOLN: 30.0000 mg | Freq: Once | INTRAMUSCULAR | Status: AC

## 2017-04-06 MED ORDER — MIDAZOLAM HCL 2 MG/2ML IJ SOLN: 4.0000 mg | Freq: Once | INTRAMUSCULAR | Status: DC

## 2017-04-06 MED ORDER — MIDAZOLAM HCL 2 MG/2ML IJ SOLN
INTRAMUSCULAR | Status: DC | PRN
  Administered 2017-04-06: 4 mg via INTRAVENOUS

## 2017-04-06 MED ORDER — DEXTROSE 5 % IV SOLN: 500.0000 mL | Freq: Once | INTRAVENOUS | Status: DC

## 2017-04-06 MED ORDER — METHOHEXITAL SODIUM 0.5 G IJ SOLR
INTRAMUSCULAR | Status: AC
  Filled 2017-04-06: qty 500

## 2017-04-06 MED ORDER — KETOROLAC TROMETHAMINE 30 MG/ML IJ SOLN
INTRAMUSCULAR | Status: AC
  Filled 2017-04-06: qty 1

## 2017-04-06 MED ORDER — HALOPERIDOL LACTATE 5 MG/ML IJ SOLN
5.0000 mg | Freq: Once | INTRAMUSCULAR | Status: AC
  Administered 2017-04-13: 5 mg via INTRAVENOUS
  Filled 2017-04-06: qty 1

## 2017-04-06 MED ORDER — SODIUM CHLORIDE 0.9 % IV SOLN
INTRAVENOUS | Status: DC | PRN
  Administered 2017-04-06: 09:00:00 via INTRAVENOUS

## 2017-04-06 MED ORDER — FENTANYL CITRATE (PF) 100 MCG/2ML IJ SOLN: 25.0000 ug | INTRAMUSCULAR | Status: DC | PRN

## 2017-04-06 MED ORDER — MIDAZOLAM HCL 2 MG/2ML IJ SOLN
4.0000 mg | Freq: Once | INTRAMUSCULAR | Status: AC
  Administered 2017-04-13: 4 mg via INTRAVENOUS

## 2017-04-06 MED ORDER — METHOHEXITAL SODIUM 100 MG/10ML IV SOSY
PREFILLED_SYRINGE | INTRAVENOUS | Status: DC | PRN
  Administered 2017-04-06: 100 mg via INTRAVENOUS

## 2017-04-06 MED ORDER — SODIUM CHLORIDE 0.9 % IV SOLN
500.0000 mL | Freq: Once | INTRAVENOUS | Status: AC
  Administered 2017-04-06: 500 mL via INTRAVENOUS

## 2017-04-06 MED ORDER — KETOROLAC TROMETHAMINE 30 MG/ML IJ SOLN
30.0000 mg | Freq: Once | INTRAMUSCULAR | Status: AC
  Administered 2017-04-06: 30 mg via INTRAVENOUS

## 2017-04-06 MED ORDER — SUCCINYLCHOLINE CHLORIDE 200 MG/10ML IV SOSY
PREFILLED_SYRINGE | INTRAVENOUS | Status: DC | PRN
  Administered 2017-04-06: 110 mg via INTRAVENOUS

## 2017-04-06 MED ORDER — ONDANSETRON HCL 4 MG/2ML IJ SOLN: 4.0000 mg | Freq: Once | INTRAMUSCULAR | Status: DC | PRN

## 2017-04-06 MED ORDER — HALOPERIDOL LACTATE 5 MG/ML IJ SOLN
INTRAMUSCULAR | Status: DC | PRN
  Administered 2017-04-06: 5 mg via INTRAVENOUS

## 2017-04-06 MED ORDER — MIDAZOLAM HCL 2 MG/2ML IJ SOLN
INTRAMUSCULAR | Status: AC
  Filled 2017-04-06: qty 4

## 2017-04-06 NOTE — Progress Notes (Signed)
[facility] MD Progress Note  04/06/2017 7:18 PM Michael Schmitt  MRN:  [id number] Subjective: Follow-up 47 year old man with severe depression and anxiety.  Patient had ECT this morning.  He was seen by treatment team this afternoon.  Continues to report depressed mood hopelessness severe anxiety.  Continues to report suicidal ideation.  He has intermittent episodes still of screaming out about wanting to die.  On interview the patient is distant and still very depressed and anxious.  Tolerating ECT without difficulty Principal Problem: Severe recurrent major depression without psychotic features ([place]) Diagnosis:   Patient Active Problem List   Diagnosis Date Noted  . Severe recurrent major depression without psychotic features ([place]) [F33.2] 03/12/2017    Priority: High  . Generalized anxiety disorder [F41.1] 02/05/2017    Priority: High  . Chronic Suicidal ideation [R45.851] 09/15/2016    Priority: High  . Chronic pain syndrome [G89.4] 11/21/2016    Priority: Medium  . Uncontrolled type 2 diabetes mellitus without complication, without long-term current use of insulin ([place]) [E11.65] 10/19/2016    Priority: Medium  . Coronary artery disease [I25.10] 09/12/2016    Priority: Medium  . Combined hyperlipidemia [E78.2] 01/05/2016    Priority: Low  . Chronic neck pain (Primary Area of Pain) (Left) [M54.2, G89.29] 11/22/2016  . Cervical facet syndrome (Left) [J62.831] 11/22/2016  . Cervical radiculitis (Left) [M54.12] 11/22/2016  . History of cervical spinal surgery [Z98.890] 11/22/2016  . Cervical spondylosis [M47.22] 11/22/2016  . Chronic shoulder pain (Secondary Area of Pain) (Left) [M25.512, G89.29] 11/22/2016  . 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 Chronic low back pain [facility] Area of Pain) (Bilateral) (L>R) [M54.5, G89.29] 11/22/2016  . Lumbar facet syndrome (Bilateral) (L>R) [M47.816] 11/22/2016  . Long term prescription benzodiazepine use [Z79.899] 11/21/2016  .  Opiate use [F11.90] 11/21/2016  . HTN (hypertension) [I10] 06/26/2016  . GERD (gastroesophageal reflux disease) [K21.9] 06/26/2016  . Alcohol use disorder, severe, 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[place]) [F10.21] 06/26/2016  . Moderate benzodiazepine use disorder ([place]) [F13.20] 06/11/2016  . Sedative, hypnotic or anxiolytic use disorder, severe, dependence ([place]) [F13.20] 05/12/2016  . Ventricular fibrillation ([place]) [I49.01] 04/05/2016  . Cervical spondylosis without myelopathy [M47.812] 07/24/2014  . Severe episode of recurrent major depressive disorder, without psychotic features ([place]) [F33.2] 04/15/2013  . Tobacco use disorder [F17.200] 10/20/2010  . Asthma [J45.909] 10/20/2010   Total Time spent with patient: 30 minutes  Past Psychiatric History: Long history of depression and anxiety failing multiple medicines  Past Medical History:  Past Medical History:  Diagnosis Date  . Anxiety   . Anxiety   . Arthritis    cerv. stenosis, spondylosis, HNP- lower back , has been followed in pain clinic, has  had injection s in cerv. area  . Blood dyscrasia    told that when he was young he was a" free bleeder"  . CAD (coronary artery disease)   . Cervical spondylosis without myelopathy 07/24/2014  . Cervicogenic headache 07/24/2014  . Chronic kidney disease    renal calculi- passed spontaneously  . Depression   . Diabetes mellitus without complication ([place])   . Fatty liver   . GERD (gastroesophageal reflux disease)   . Headache(784.0)   . Hyperlipidemia   . Hypertension   . Mental disorder   . MI, old   . RLS (restless legs syndrome)    detected on sleep study  . Shortness of breath   . Ventricular fibrillation ([place]) 04/05/2016    Past Surgical History:  Procedure Laterality Date  .  ANTERIOR CERVICAL DECOMP/DISCECTOMY FUSION  11/13/2011   Procedure: ANTERIOR CERVICAL DECOMPRESSION/DISCECTOMY FUSION 1 LEVEL;  Surgeon: [name], MD;  Location: [place] [facility];  Service: Neurosurgery;  Laterality:  N/A;  Anterior Cervical Decompression/discectomy Fusion. Cervical three-four.  Marland Kitchen CARDIAC CATHETERIZATION N/A 01/01/2015   Procedure: Left Heart Cath and Coronary Angiography;  Surgeon: [name], MD;  Location: [place] [facility];  Service: Cardiovascular;  Laterality: N/A;  . CARDIAC CATHETERIZATION N/A 04/05/2016   Procedure: Left Heart Cath and Coronary Angiography;  Surgeon: Belva Crome, MD;  Location: [place] [facility];  Service: Cardiovascular;  Laterality: N/A;  . CARDIAC CATHETERIZATION N/A 04/05/2016   Procedure: Coronary Stent Intervention;  Surgeon: Belva Crome, MD;  Location: [place] CV LAB;  Service: Cardiovascular;  Laterality: N/A;  . CARDIAC CATHETERIZATION N/A 04/05/2016   Procedure: Intravascular Ultrasound/IVUS;  Surgeon: Belva Crome, MD;  Location: [place] [facility];  Service: Cardiovascular;  Laterality: N/A;  . NASAL SINUS SURGERY     2005   Family History:  Family History  Problem Relation Age of Onset  . Prostate cancer Father   . Hypertension Mother   . Kidney Stones Mother   . Anxiety disorder Mother   . Depression Mother   . COPD Sister   . Hypertension Sister   . Diabetes Sister   . Depression Sister   . Anxiety disorder Sister   . Seizures Sister   . ADD / ADHD Son   . ADD / ADHD Daughter    Family Psychiatric  History: None Social History:  Social History   Substance and Sexual Activity  Alcohol Use No  . Alcohol/week: 0.0 oz   Comment: last drinked in 3 months.      Social History   Substance and Sexual Activity  Drug Use No    Social History   Socioeconomic History  . Marital status: Widowed    Spouse name: None  . Number of children: 3  . Years of education: 21  . Highest education level: None  Social Needs  . Financial resource strain: None  . Food insecurity - worry: None  . Food insecurity - inability: None  . Transportation needs - medical: None  . Transportation needs - non-medical: None  Occupational History     Comment: unemployed  Tobacco Use  . Smoking status: Current Every Day Smoker    Packs/day: 2.00    Years: 17.00    Pack years: 34.00    Types: Cigarettes, Cigars  . Smokeless tobacco: Former Systems developer  . Tobacco comment: occassional snuff   Substance and Sexual Activity  . Alcohol use: No    Alcohol/week: 0.0 oz    Comment: last drinked in 3 months.   . Drug use: No  . Sexual activity: Not Currently  Other Topics Concern  . None  Social History Narrative   Patient is right handed.   Patient drinks 2 sodas daily.   Additional Social History:                         Sleep: Fair  Appetite:  Good  Current Medications: Current Facility-Administered Medications  Medication Dose Route Frequency Provider Last Rate Last Dose  . acetaminophen (TYLENOL) tablet 650 mg  650 mg Oral Q6H PRN Rosevelt Luu, Madie Reno, MD   650 mg at 04/06/17 1302  . alum & mag hydroxide-simeth (MAALOX/MYLANTA) 200-200-20 MG/5ML suspension 30 mL  30 mL Oral Q4H PRN Xinyi Batton, Madie Reno, MD      .  atorvastatin (LIPITOR) tablet 80 mg  80 mg Oral q1800 Sabir Charters, Madie Reno, MD   80 mg at 04/06/17 1732  . carvedilol (COREG) tablet 6.25 mg  6.25 mg Oral BID WC Janelis Stelzer T, MD   6.25 mg at 04/06/17 1732  . clopidogrel (PLAVIX) tablet 75 mg  75 mg Oral Daily Melitza Metheny, Madie Reno, MD   75 mg at 04/06/17 1303  . dextrose 5 % solution 500 mL  500 mL Intravenous Once Ammanda Dobbins T, MD      . escitalopram (LEXAPRO) tablet 20 mg  20 mg Oral Daily Jayani Rozman, Madie Reno, MD   20 mg at 04/06/17 1302  . fentaNYL (SUBLIMAZE) injection 25 mcg  25 mcg Intravenous Q5 min PRN Gunnar Bulla, MD      . haloperidol lactate (HALDOL) injection 5 mg  5 mg Intravenous Once Lilburn Straw T, MD      . haloperidol lactate (HALDOL) injection 5 mg  5 mg Intravenous Once Donny Heffern T, MD      . haloperidol lactate (HALDOL) injection 5 mg  5 mg Intravenous Once Pritesh Sobecki,  T, MD      . hydrOXYzine (ATARAX/VISTARIL) tablet 50 mg  50 mg Oral TID PRN  Randeep Biondolillo T, MD      . ketorolac (TORADOL) 30 MG/ML injection 30 mg  30 mg Intravenous Once  Carmack,  T, MD      . ketorolac (TORADOL) 30 MG/ML injection           . magnesium hydroxide (MILK OF MAGNESIA) suspension 30 mL  30 mL Oral Daily PRN Prentiss Polio T, MD      . metFORMIN (GLUCOPHAGE) tablet 500 mg  500 mg Oral BID WC Kerrie Timm, Madie Reno, MD   500 mg at 04/06/17 1732  . midazolam (VERSED) injection 4 mg  4 mg Intravenous Once Remon Quinto T, MD      . midazolam (VERSED) injection 4 mg  4 mg Intravenous Once Jessi Jessop T, MD      . mirtazapine (REMERON) tablet 30 mg  30 mg Oral QHS Daniyah Fohl, Madie Reno, MD   30 mg at 04/05/17 2128  . ondansetron (ZOFRAN) injection 4 mg  4 mg Intravenous Once PRN Gunnar Bulla, MD      . prazosin (MINIPRESS) capsule 2 mg  2 mg Oral QHS , Madie Reno, MD   2 mg at 04/05/17 2323  . QUEtiapine (SEROQUEL) tablet 100 mg  100 mg Oral QHS Sharief Wainwright T, MD   100 mg at 04/05/17 2128  . traZODone (DESYREL) tablet 100 mg  100 mg Oral QHS PRN , Madie Reno, MD   100 mg at 04/05/17 2128    Lab Results:  Results for orders placed or performed during the hospital encounter of 04/04/17 (from the past 48 hour(s))  Glucose, capillary     Status: Abnormal   Collection Time: 04/06/17  7:19 AM  Result Value Ref Range   Glucose-Capillary 147 (H) 65 - 99 mg/dL   Comment 1 Notify RN   Glucose, capillary     Status: Abnormal   Collection Time: 04/06/17 11:15 AM  Result Value Ref Range   Glucose-Capillary 131 (H) 65 - 99 mg/dL    Blood Alcohol level:  Lab Results  Component Value Date   ETH 37 (H) 03/12/2017   ETH <10 32/95/1884    Metabolic Disorder Labs: Lab Results  Component Value Date   HGBA1C 6.0 (H) 03/13/2017   MPG 126 03/13/2017   MPG 177 09/12/2016  Lab Results  Component Value Date   PROLACTIN 18.1 (H) 09/12/2016   PROLACTIN 6.5 05/27/2016   Lab Results  Component Value Date   CHOL 118 03/13/2017   TRIG 325 (H) 03/13/2017   HDL 31  (L) 03/13/2017   CHOLHDL 3.8 03/13/2017   VLDL 65 (H) 03/13/2017   LDLCALC 22 03/13/2017   LDLCALC 37 09/12/2016    Physical Findings: AIMS:  , ,  ,  ,    CIWA:    COWS:     Musculoskeletal: Strength & Muscle Tone: within normal limits Gait & Station: normal Patient leans: N/A  Psychiatric Specialty Exam: Physical Exam  Nursing note and vitals reviewed. Constitutional: He appears well-developed and well-nourished.  HENT:  Head: Normocephalic and atraumatic.  Eyes: Conjunctivae are normal. Pupils are equal, round, and reactive to light.  Neck: Normal range of motion.  Cardiovascular: Regular rhythm and normal heart sounds.  Respiratory: Effort normal. No respiratory distress.  GI: Soft.  Musculoskeletal: Normal range of motion.  Neurological: He is alert.  Skin: Skin is warm and dry.  Psychiatric: His mood appears anxious. His speech is delayed. He is slowed and withdrawn. He expresses impulsivity. He exhibits a depressed mood. He expresses suicidal ideation. He exhibits abnormal recent memory.    Review of Systems  Constitutional: Negative.   HENT: Negative.   Eyes: Negative.   Respiratory: Negative.   Cardiovascular: Negative.   Gastrointestinal: Negative.   Musculoskeletal: Negative.   Skin: Negative.   Neurological: Negative.   Psychiatric/Behavioral: Positive for depression, memory loss and suicidal ideas. Negative for hallucinations and substance abuse. The patient is nervous/anxious. The patient does not have insomnia.     Blood pressure (!) 144/94, pulse 84, temperature 97.7 F (36.5 C), temperature source Oral, resp. rate 20, height 6\' 2"  (1.88 m), weight 92.5 kg (204 lb), SpO2 94 %.Body mass index is 26.19 kg/m.  General Appearance: Casual  Eye Contact:  Fair  Speech:  Slow  Volume:  Decreased  Mood:  Depressed  Affect:  Congruent  Thought Process:  Goal Directed  Orientation:  Full (Time, Place, and Person)  Thought Content:  Logical  Suicidal  Thoughts:  Yes.  with intent/plan  Homicidal Thoughts:  No  Memory:  Immediate;   Fair Recent;   Poor Remote;   Fair  Judgement:  Impaired  Insight:  Fair  Psychomotor Activity:  Decreased  Concentration:  Concentration: Fair  Recall:  [employer] of Knowledge:  Fair  Language:  Fair  Akathisia:  No  Handed:  Right  AIMS (if indicated):     Assets:  Desire for Improvement Housing  ADL's:  Intact  Cognition:  Impaired,  Mild  Sleep:  Number of Hours: 8     Treatment Plan Summary: Daily contact with patient to assess and evaluate symptoms and progress in treatment, Medication management and Plan Continue ECT next treatment Monday.  Continue current medication.  Strongly encourage patient to be actively engaged on the unit in treatment.  Follow up into next week.  He is still reporting active suicidal ideation and remains agitated with bizarre behavior at times.  Alethia Berthold, MD 04/06/2017, 7:18 PM

## 2017-04-06 NOTE — Transfer of Care (Signed)
Immediate Anesthesia Transfer of Care Note  Patient: Michael Schmitt  Procedure(s) Performed: ECT TX  Patient Location: PACU  Anesthesia Type:General  Level of Consciousness: sedated  Airway & Oxygen Therapy: Patient Spontanous Breathing and Patient connected to face mask oxygen  Post-op Assessment: Report given to RN and Post -op Vital signs reviewed and stable  Post vital signs: Reviewed and stable  Last Vitals:  Vitals:   04/06/17 0700 04/06/17 1036  BP: 128/80 (!) 147/82  Pulse: 72 88  Resp: 18 18  Temp:  (!) 36 C  SpO2:  95%    Last Pain:  Vitals:   04/06/17 1036  TempSrc:   PainSc: Asleep         Complications: No apparent anesthesia complications

## 2017-04-06 NOTE — Anesthesia Postprocedure Evaluation (Signed)
Anesthesia Post Note  Patient: Michael Schmitt  Procedure(s) Performed: ECT TX  Patient location during evaluation: PACU Anesthesia Type: General Level of consciousness: awake and alert Pain management: pain level controlled Vital Signs Assessment: post-procedure vital signs reviewed and stable Respiratory status: spontaneous breathing, nonlabored ventilation, respiratory function stable and patient connected to nasal cannula oxygen Cardiovascular status: blood pressure returned to baseline and stable Postop Assessment: no apparent nausea or vomiting Anesthetic complications: no     Last Vitals:  Vitals:   04/06/17 1110 04/06/17 1126  BP: 126/75 (!) 144/94  Pulse: 73 84  Resp: (!) 38 20  Temp: 37.2 C 36.5 C  SpO2: 94%     Last Pain:  Vitals:   04/06/17 1126  TempSrc: Oral  PainSc:                  Daleyssa Loiselle S

## 2017-04-06 NOTE — Progress Notes (Signed)
Recreation Therapy Notes  Date: 01.11.2019  Time: 1:00 PM  Location: Craft Room  Behavioral response: Appropriate  Intervention Topic: Leisure  Discussion/Intervention: Group content today was focused on leisure. The group defined what leisure is and some positive leisure activities they participate in. Individuals identified the difference between good and bad leisure. Participants expressed how they feel after participating in the leisure of their choice. The group discussed how they go about picking a leisure activity and if others are involved in their leisure activities. The patient stated how many leisure activities they too choose from and reasons why it is important to have leisure time. Individuals participated in the intervention "Leisure Jeopardy" where they had a chance to identify new leisure activities as well as benefits of leisure. Clinical Observations/Feedback:  Patient came to group and identified reading as a leisure activity he likes to participate in. He participated in the intervention and continues to work on his goals.  Deniah Saia LRT/CTRS         Edwena Mayorga 04/06/2017 2:47 PM

## 2017-04-06 NOTE — H&P (Addendum)
Michael Schmitt is an 47 y.o. male.   Chief Complaint: Patient continues to feel depressed and anxious on edge slightly out of control suicidal ideation HPI: History of recurrent severe depression and anxiety  Past Medical History:  Diagnosis Date  . Anxiety   . Anxiety   . Arthritis    cerv. stenosis, spondylosis, HNP- lower back , has been followed in pain clinic, has  had injection s in cerv. area  . Blood dyscrasia    told that when he was young he was a" free bleeder"  . CAD (coronary artery disease)   . Cervical spondylosis without myelopathy 07/24/2014  . Cervicogenic headache 07/24/2014  . Chronic kidney disease    renal calculi- passed spontaneously  . Depression   . Diabetes mellitus without complication ([place])   . Fatty liver   . GERD (gastroesophageal reflux disease)   . Headache(784.0)   . Hyperlipidemia   . Hypertension   . Mental disorder   . MI, old   . RLS (restless legs syndrome)    detected on sleep study  . Shortness of breath   . Ventricular fibrillation ([place]) 04/05/2016    Past Surgical History:  Procedure Laterality Date  . ANTERIOR CERVICAL DECOMP/DISCECTOMY FUSION  11/13/2011   Procedure: ANTERIOR CERVICAL DECOMPRESSION/DISCECTOMY FUSION 1 LEVEL;  Surgeon: [name], MD;  Location: [place] [facility];  Service: Neurosurgery;  Laterality: N/A;  Anterior Cervical Decompression/discectomy Fusion. Cervical three-four.  Marland Kitchen CARDIAC CATHETERIZATION N/A 01/01/2015   Procedure: Left Heart Cath and Coronary Angiography;  Surgeon: [name], MD;  Location: [place] [facility];  Service: Cardiovascular;  Laterality: N/A;  . CARDIAC CATHETERIZATION N/A 04/05/2016   Procedure: Left Heart Cath and Coronary Angiography;  Surgeon: Belva Crome, MD;  Location: [facility];  Service: Cardiovascular;  Laterality: N/A;  . CARDIAC CATHETERIZATION N/A 04/05/2016   Procedure: Coronary Stent Intervention;  Surgeon: Belva Crome, MD;  Location: [facility];  Service:  Cardiovascular;  Laterality: N/A;  . CARDIAC CATHETERIZATION N/A 04/05/2016   Procedure: Intravascular Ultrasound/IVUS;  Surgeon: Belva Crome, MD;  Location: [place] [facility];  Service: Cardiovascular;  Laterality: N/A;  . NASAL SINUS SURGERY     2005    Family History  Problem Relation Age of Onset  . Prostate cancer Father   . Hypertension Mother   . Kidney Stones Mother   . Anxiety disorder Mother   . Depression Mother   . COPD Sister   . Hypertension Sister   . Diabetes Sister   . Depression Sister   . Anxiety disorder Sister   . Seizures Sister   . ADD / ADHD Son   . ADD / ADHD Daughter    Social History:  reports that he has been smoking cigarettes and cigars.  He has a 34.00 pack-year smoking history. He has quit using smokeless tobacco. He reports that he does not drink alcohol or use drugs.  Allergies:  Allergies  Allergen Reactions  . Prednisone Other (See Comments)    Hypertension, makes him feel spacey  . Hydrocodone Other (See Comments)    Headache, irritable  . Varenicline Other (See Comments)    Suicidal thoughts    Medications Prior to Admission  Medication Sig Dispense Refill  . aspirin EC 81 MG EC tablet Take 1 tablet (81 mg total) by mouth daily. 30 tablet 1  . atorvastatin (LIPITOR) 80 MG tablet Take 1 tablet (80 mg total) by mouth daily at 6 PM.  30 tablet 1  . carvedilol (COREG) 6.25 MG tablet Take 1 tablet (6.25 mg total) by mouth 2 (two) times daily with a meal. 60 tablet 1  . clopidogrel (PLAVIX) 75 MG tablet Take 1 tablet (75 mg total) by mouth daily. 30 tablet 1  . escitalopram (LEXAPRO) 20 MG tablet Take 1 tablet (20 mg total) by mouth daily. 30 tablet 1  . hydrOXYzine (ATARAX/VISTARIL) 50 MG tablet Take 1 tablet (50 mg total) by mouth 3 (three) times daily as needed for anxiety. 90 tablet 1  . lurasidone 60 MG TABS Take 1 tablet (60 mg total) by mouth daily with breakfast. 30 tablet 1  . metFORMIN (GLUCOPHAGE) 500 MG tablet Take 1 tablet  (500 mg total) by mouth 2 (two) times daily with a meal. 60 tablet 1  . mirtazapine (REMERON) 15 MG tablet Take 1 tablet (15 mg total) by mouth at bedtime. 30 tablet 0  . nitroGLYCERIN (NITROSTAT) 0.4 MG SL tablet Place 1 tablet (0.4 mg total) under the tongue every 5 (five) minutes x 3 doses as needed. 10 tablet 0  . prazosin (MINIPRESS) 2 MG capsule Take 1 capsule (2 mg total) by mouth at bedtime. 30 capsule 1  . traZODone (DESYREL) 100 MG tablet Take 1 tablet (100 mg total) by mouth at bedtime as needed for sleep. 30 tablet 1  . zolpidem (AMBIEN) 10 MG tablet Take 1 tablet (10 mg total) by mouth at bedtime. 30 tablet 0    Results for orders placed or performed during the hospital encounter of 04/04/17 (from the past 48 hour(s))  Glucose, capillary     Status: Abnormal   Collection Time: 04/06/17  7:19 AM  Result Value Ref Range   Glucose-Capillary 147 (H) 65 - 99 mg/dL   Comment 1 Notify RN    No results found.  Review of Systems  Constitutional: Negative.   HENT: Negative.   Eyes: Negative.   Respiratory: Negative.   Cardiovascular: Negative.   Gastrointestinal: Negative.   Musculoskeletal: Negative.   Skin: Negative.   Neurological: Negative.   Psychiatric/Behavioral: Positive for depression, memory loss and suicidal ideas. Negative for hallucinations and substance abuse. The patient is nervous/anxious and has insomnia.     Blood pressure 128/80, pulse 72, temperature 98.9 F (37.2 C), temperature source Oral, resp. rate 18, height 6\' 2"  (1.88 m), weight 92.5 kg (204 lb), SpO2 99 %. Physical Exam  Nursing note and vitals reviewed. Constitutional: He appears well-developed and well-nourished.  HENT:  Head: Normocephalic and atraumatic.  Eyes: Conjunctivae are normal. Pupils are equal, round, and reactive to light.  Neck: Normal range of motion.  Cardiovascular: Regular rhythm and normal heart sounds.  Respiratory: Effort normal. No respiratory distress.  GI: Soft.   Musculoskeletal: Normal range of motion.  Neurological: He is alert.  Skin: Skin is warm and dry.  Psychiatric: His mood appears anxious. His speech is delayed. He is slowed. Cognition and memory are impaired. He expresses impulsivity. He exhibits a depressed mood. He expresses suicidal ideation.     Assessment/Plan Today we will be treatment #3 after a break in treatment during the week and we will plan to try to make this now a continue all 3 times a week treatment going forward  Alethia Berthold, MD 04/06/2017, 10:17 AM

## 2017-04-06 NOTE — Progress Notes (Signed)
Patient in room yelling and screaming.  When asked what's going on states "I'm fucking tired of living"  When asked what he is going to do about it, yells "I don't know what to do, I'm just tired.  You tell me, you are suppose to be expert"  Informed that it would be helpful to try to start thinking positive. States "How do you do that"  Explained that you have to think about good instead of bad.

## 2017-04-06 NOTE — Anesthesia Preprocedure Evaluation (Signed)
Anesthesia Evaluation  Patient identified by MRN, date of birth, ID band Patient awake    Reviewed: Allergy & Precautions, NPO status , Patient's Chart, lab work & pertinent test results, reviewed documented beta blocker date and time   Airway Mallampati: II  TM Distance: >3 FB     Dental  (+) Chipped   Pulmonary shortness of breath, asthma , Current Smoker,           Cardiovascular hypertension, Pt. on medications and Pt. on home beta blockers + CAD and + Past MI       Neuro/Psych  Headaches, PSYCHIATRIC DISORDERS Anxiety Depression  Neuromuscular disease    GI/Hepatic GERD  Controlled,  Endo/Other  diabetes, Type 2  Renal/GU Renal disease     Musculoskeletal  (+) Arthritis ,   Abdominal   Peds  Hematology  (+) Blood dyscrasia, ,   Anesthesia Other Findings   Reproductive/Obstetrics                             Anesthesia Physical Anesthesia Plan  ASA: III  Anesthesia Plan: General   Post-op Pain Management:    Induction: Intravenous  PONV Risk Score and Plan:   Airway Management Planned:   Additional Equipment:   Intra-op Plan:   Post-operative Plan:   Informed Consent: I have reviewed the patients History and Physical, chart, labs and discussed the procedure including the risks, benefits and alternatives for the proposed anesthesia with the patient or authorized representative who has indicated his/her understanding and acceptance.     Plan Discussed with: CRNA  Anesthesia Plan Comments:         Anesthesia Quick Evaluation

## 2017-04-06 NOTE — [facility] Group Notes (Signed)
04/06/2017 9:30AM  Type of Therapy and Topic:  Group Therapy:  Feelings around Relapse and Recovery  Participation Level:  Did Not Attend   Description of Group:    Patients in this group will discuss emotions they experience before and after a relapse. They will process how experiencing these feelings, or avoidance of experiencing them, relates to having a relapse. Facilitator will guide patients to explore emotions they have related to recovery. Patients will be encouraged to process which emotions are more powerful. They will be guided to discuss the emotional reaction significant others in their lives may have to patients' relapse or recovery. Patients will be assisted in exploring ways to respond to the emotions of others without this contributing to a relapse.  Therapeutic Goals: 1. Patient will identify two or more emotions that lead to a relapse for them 2. Patient will identify two emotions that result when they relapse 3. Patient will identify two emotions related to recovery 4. Patient will demonstrate ability to communicate their needs through discussion and/or role plays   Summary of Patient Progress: Patient was encouraged and invited to attend group. Patient did not attend group. Social worker will continue to encourage group participation in the future.      Therapeutic Modalities:   Cognitive Behavioral Therapy Solution-Focused Therapy Assertiveness Training Relapse Prevention Therapy   Darin Engels, [place] 04/06/2017 10:55 AM

## 2017-04-06 NOTE — Plan of Care (Signed)
Support and encouragement offered.  Safety maintained on the unit.

## 2017-04-06 NOTE — Progress Notes (Signed)
Patient continues to endorse depression and anxiety, appeared depressed, lacking coping skills and does not want to be bordered, but cooperating with his medicines and stating that is not working for him, support and encouragement is rendered, patient contract for safety of self and others, sleep long hours without any interruptions, 15 minute checks is in progress at thiis time.

## 2017-04-06 NOTE — Anesthesia Procedure Notes (Signed)
Date/Time: 04/06/2017 10:27 AM Performed by: Dionne Bucy, CRNA Pre-anesthesia Checklist: Patient identified, Emergency Drugs available, Suction available and Patient being monitored Patient Re-evaluated:Patient Re-evaluated prior to induction Oxygen Delivery Method: Circle system utilized Preoxygenation: Pre-oxygenation with 100% oxygen Induction Type: IV induction Ventilation: Mask ventilation without difficulty and Mask ventilation throughout procedure Airway Equipment and Method: Bite block Placement Confirmation: positive ETCO2 Dental Injury: Teeth and Oropharynx as per pre-operative assessment

## 2017-04-06 NOTE — Tx Team (Signed)
Interdisciplinary Treatment and Diagnostic Plan Update  04/06/2017 Time of Session: 3:15pm Michael Schmitt MRN: [id number]  Principal Diagnosis: Severe recurrent major depression without psychotic features [facility])  Secondary Diagnoses: Principal Problem:   Severe recurrent major depression without psychotic features ([place]) Active Problems:   Combined hyperlipidemia   Coronary artery disease   Chronic Suicidal ideation   Uncontrolled type 2 diabetes mellitus without complication, without long-term current use of insulin (HCC)   Chronic pain syndrome   Generalized anxiety disorder   Current Medications:  Current Facility-Administered Medications  Medication Dose Route Frequency Provider Last Rate Last Dose  . acetaminophen (TYLENOL) tablet 650 mg  650 mg Oral Q6H PRN Clapacs, Madie Reno, MD   650 mg at 04/06/17 1302  . alum & mag hydroxide-simeth (MAALOX/MYLANTA) 200-200-20 MG/5ML suspension 30 mL  30 mL Oral Q4H PRN Clapacs, John T, MD      . atorvastatin (LIPITOR) tablet 80 mg  80 mg Oral q1800 Clapacs, Madie Reno, MD   80 mg at 04/05/17 1739  . carvedilol (COREG) tablet 6.25 mg  6.25 mg Oral BID WC Clapacs, John T, MD   6.25 mg at 04/06/17 1303  . clopidogrel (PLAVIX) tablet 75 mg  75 mg Oral Daily Clapacs, Madie Reno, MD   75 mg at 04/06/17 1303  . dextrose 5 % solution 500 mL  500 mL Intravenous Once Clapacs, John T, MD      . escitalopram (LEXAPRO) tablet 20 mg  20 mg Oral Daily Clapacs, Madie Reno, MD   20 mg at 04/06/17 1302  . fentaNYL (SUBLIMAZE) injection 25 mcg  25 mcg Intravenous Q5 min PRN Gunnar Bulla, MD      . haloperidol lactate (HALDOL) injection 5 mg  5 mg Intravenous Once Clapacs, John T, MD      . haloperidol lactate (HALDOL) injection 5 mg  5 mg Intravenous Once Clapacs, John T, MD      . haloperidol lactate (HALDOL) injection 5 mg  5 mg Intravenous Once Clapacs, John T, MD      . hydrOXYzine (ATARAX/VISTARIL) tablet 50 mg  50 mg Oral TID PRN Clapacs, John T, MD      .  ketorolac (TORADOL) 30 MG/ML injection 30 mg  30 mg Intravenous Once Clapacs, John T, MD      . ketorolac (TORADOL) 30 MG/ML injection           . magnesium hydroxide (MILK OF MAGNESIA) suspension 30 mL  30 mL Oral Daily PRN Clapacs, John T, MD      . metFORMIN (GLUCOPHAGE) tablet 500 mg  500 mg Oral BID WC Clapacs, Madie Reno, MD   500 mg at 04/06/17 1303  . midazolam (VERSED) injection 4 mg  4 mg Intravenous Once Clapacs, John T, MD      . midazolam (VERSED) injection 4 mg  4 mg Intravenous Once Clapacs, John T, MD      . mirtazapine (REMERON) tablet 30 mg  30 mg Oral QHS Clapacs, Madie Reno, MD   30 mg at 04/05/17 2128  . ondansetron (ZOFRAN) injection 4 mg  4 mg Intravenous Once PRN Gunnar Bulla, MD      . prazosin (MINIPRESS) capsule 2 mg  2 mg Oral QHS Clapacs, Madie Reno, MD   2 mg at 04/05/17 2323  . QUEtiapine (SEROQUEL) tablet 100 mg  100 mg Oral QHS Clapacs, John T, MD   100 mg at 04/05/17 2128  . traZODone (DESYREL) tablet 100 mg  100 mg Oral QHS  PRN Clapacs, Madie Reno, MD   100 mg at 04/05/17 2128   PTA Medications: Medications Prior to Admission  Medication Sig Dispense Refill Last Dose  . aspirin EC 81 MG EC tablet Take 1 tablet (81 mg total) by mouth daily. 30 tablet 1 04/05/2017  . atorvastatin (LIPITOR) 80 MG tablet Take 1 tablet (80 mg total) by mouth daily at 6 PM. 30 tablet 1 04/05/2017  . carvedilol (COREG) 6.25 MG tablet Take 1 tablet (6.25 mg total) by mouth 2 (two) times daily with a meal. 60 tablet 1 04/05/2017  . clopidogrel (PLAVIX) 75 MG tablet Take 1 tablet (75 mg total) by mouth daily. 30 tablet 1 04/05/2017  . escitalopram (LEXAPRO) 20 MG tablet Take 1 tablet (20 mg total) by mouth daily. 30 tablet 1 04/05/2017  . hydrOXYzine (ATARAX/VISTARIL) 50 MG tablet Take 1 tablet (50 mg total) by mouth 3 (three) times daily as needed for anxiety. 90 tablet 1 04/05/2017  . lurasidone 60 MG TABS Take 1 tablet (60 mg total) by mouth daily with breakfast. 30 tablet 1 04/05/2017  . metFORMIN  (GLUCOPHAGE) 500 MG tablet Take 1 tablet (500 mg total) by mouth 2 (two) times daily with a meal. 60 tablet 1 04/05/2017  . mirtazapine (REMERON) 15 MG tablet Take 1 tablet (15 mg total) by mouth at bedtime. 30 tablet 0 04/05/2017  . nitroGLYCERIN (NITROSTAT) 0.4 MG SL tablet Place 1 tablet (0.4 mg total) under the tongue every 5 (five) minutes x 3 doses as needed. 10 tablet 0 04/05/2017  . prazosin (MINIPRESS) 2 MG capsule Take 1 capsule (2 mg total) by mouth at bedtime. 30 capsule 1 04/05/2017  . traZODone (DESYREL) 100 MG tablet Take 1 tablet (100 mg total) by mouth at bedtime as needed for sleep. 30 tablet 1 04/05/2017  . zolpidem (AMBIEN) 10 MG tablet Take 1 tablet (10 mg total) by mouth at bedtime. 30 tablet 0 04/05/2017    Patient Stressors: Legal issue Medication change or noncompliance  Patient Strengths: Average or above average intelligence Communication skills Supportive family/friends  Treatment Modalities: Medication Management, Group therapy, Case management,  1 to 1 session with clinician, Psychoeducation, Recreational therapy.   Physician Treatment Plan for Primary Diagnosis: Severe recurrent major depression without psychotic features ([place]) Long Term Goal(s): Improvement in symptoms so as ready for discharge Improvement in symptoms so as ready for discharge   Short Term Goals: Ability to verbalize feelings will improve Ability to disclose and discuss suicidal ideas Ability to demonstrate self-control will improve Ability to demonstrate self-control will improve Ability to identify and develop effective coping behaviors will improve  Medication Management: Evaluate patient's response, side effects, and tolerance of medication regimen.  Therapeutic Interventions: 1 to 1 sessions, Unit Group sessions and Medication administration.  Evaluation of Outcomes: Progressing  Physician Treatment Plan for Secondary Diagnosis: Principal Problem:   Severe recurrent major depression  without psychotic features ([place]) Active Problems:   Combined hyperlipidemia   Coronary artery disease   Chronic Suicidal ideation   Uncontrolled type 2 diabetes mellitus without complication, without long-term current use of insulin (HCC)   Chronic pain syndrome   Generalized anxiety disorder  Long Term Goal(s): Improvement in symptoms so as ready for discharge Improvement in symptoms so as ready for discharge   Short Term Goals: Ability to verbalize feelings will improve Ability to disclose and discuss suicidal ideas Ability to demonstrate self-control will improve Ability to demonstrate self-control will improve Ability to identify and develop effective coping behaviors will improve  Medication Management: Evaluate patient's response, side effects, and tolerance of medication regimen.  Therapeutic Interventions: 1 to 1 sessions, Unit Group sessions and Medication administration.  Evaluation of Outcomes: Progressing   RN Treatment Plan for Primary Diagnosis: Severe recurrent major depression without psychotic features ([place]) Long Term Goal(s): Knowledge of disease and therapeutic regimen to maintain health will improve  Short Term Goals: Ability to demonstrate self-control, Ability to participate in decision making will improve, Ability to disclose and discuss suicidal ideas and Ability to identify and develop effective coping behaviors will improve  Medication Management: RN will administer medications as ordered by provider, will assess and evaluate patient's response and provide education to patient for prescribed medication. RN will report any adverse and/or side effects to prescribing provider.  Therapeutic Interventions: 1 on 1 counseling sessions, Psychoeducation, Medication administration, Evaluate responses to treatment, Monitor vital signs and CBGs as ordered, Perform/monitor CIWA, COWS, AIMS and Fall Risk screenings as ordered, Perform wound care treatments as  ordered.  Evaluation of Outcomes: Progressing   LCSW Treatment Plan for Primary Diagnosis: Severe recurrent major depression without psychotic features ([place]) Long Term Goal(s): Safe transition to appropriate next level of care at discharge, Engage patient in therapeutic group addressing interpersonal concerns.  Short Term Goals: Engage patient in aftercare planning with referrals and resources, Facilitate patient progression through stages of change regarding substance use diagnoses and concerns and Increase skills for wellness and recovery  Therapeutic Interventions: Assess for all discharge needs, 1 to 1 time with Social worker, Explore available resources and support systems, Assess for adequacy in community support network, Educate family and significant other(s) on suicide prevention, Complete Psychosocial Assessment, Interpersonal group therapy.  Evaluation of Outcomes: Progressing   Progress in Treatment: Attending groups: Yes. Participating in groups: Yes. Taking medication as prescribed: Yes. Toleration medication: Yes. Family/Significant other contact made: No. Attempted to contact sister 2x. Patient understands diagnosis: Yes. Discussing patient identified problems/goals with staff: Yes. Medical problems stabilized or resolved: Yes. Denies suicidal/homicidal ideation: No. Issues/concerns per patient self-inventory: No. Other:  New problem(s) identified: No, Describe:  None  New Short Term/Long Term Goal(s): "I want to get better."  Discharge Plan or Barriers: To return home with sister and follow up with [name] for ECT and CBC for medication management.  Reason for Continuation of Hospitalization: Depression Medication stabilization Suicidal ideation  Estimated Length of Stay: 5-7days  Attendees: Patient: Michael Schmitt 04/06/2017 3:35 PM  Physician: Alethia Berthold, MD 04/06/2017 3:35 PM  Nursing:  04/06/2017 3:35 PM  RN Care Manager: 04/06/2017 3:35 PM  Social  Worker: Darin Engels, [place] 04/06/2017 3:35 PM  Recreational Therapist:  04/06/2017 3:35 PM  Other:  04/06/2017 3:35 PM  Other:  04/06/2017 3:35 PM  Other: 04/06/2017 3:35 PM    Scribe for Treatment Team: Darin Engels, LCSW 04/06/2017 3:35 PM

## 2017-04-06 NOTE — Plan of Care (Signed)
Pt. Reports sleeping, "good". Pt verbalizes understanding of provided education. Pt. Compliant with medications this evening. Pt. Denies SI/HI and verbally contracts for safety. Pt reports he is able to remain safe while on the unit.

## 2017-04-06 NOTE — [facility] Suicide Risk Assessment (Signed)
[place] INPATIENT:  Family/Significant Other Suicide Prevention Education  Suicide Prevention Education:  Contact Attempts: Sergio Zawislak, Sister, [phone] has been identified by the patient as the family member/significant other with whom the patient will be residing, and identified as the person(s) who will aid the patient in the event of a mental health crisis.  With written consent from the patient, two attempts were made to provide suicide prevention education, prior to and/or following the patient's discharge.  We were unsuccessful in providing suicide prevention education.  A suicide education pamphlet was given to the patient to share with family/significant other.  Date and time of first attempt:04/05/2017 at 11am Date and time of second attempt: 04/06/2017 at 11:30am  Darin Engels 04/06/2017, 11:30 AM

## 2017-04-06 NOTE — Progress Notes (Signed)
Recreation Therapy Notes  INPATIENT RECREATION THERAPY ASSESSMENT  Patient Details Name: Michael Schmitt MRN: [id number] DOB: [birth year] Today's Date: 04/06/2017  Patient Stressors: (Depression)  Coping Skills:   (Read, Work)  Personal Challenges: Anger, Communication, Concentration, Decision-Making, Expressing Yourself, Self-Esteem/Confidence, Problem-Solving, Stress Management  Leisure Interests (2+):  (Nothing)  Awareness of Community Resources:  No  Community Resources:     Current Use: No  If no, Barriers?:    Patient Strengths:  I am a good dad  Patient Identified Areas of Improvement:  Work on my depression and anxiety   Current Recreation Participation:  Walking  Patient Goal for Hospitalization:  Focus on [place] of Residence:  [place] of Residence:  [place]   Current SI (including self-harm):  Yes Patient states that he is feeling a little suicidal but will contact staff if he feels the urge to act on this.  Current HI:  No  Consent to Intern Participation: N/A   [name] 04/06/2017, 3:09 PM

## 2017-04-06 NOTE — Procedures (Signed)
ECT SERVICES Physician's Interval Evaluation & Treatment Note  Patient Identification: Michael Schmitt MRN:  [id number] Date of Evaluation:  04/06/2017 TX #: 3  MADRS:   MMSE:   P.E. Findings:  No change to physical exam  Psychiatric Interval Note:  Patient continues to be very depressed very anxious.  He has intermittent episodes still of shouting out talking about suicidal ideation  Subjective:  Patient is a 47 y.o. male seen for evaluation for Electroconvulsive Therapy. Feeling very bad very anxious very hopeless  Treatment Summary:   []   Right Unilateral             [x]  Bilateral   % Energy : 1.0 ms 50%   Impedance: 690 ohms  Seizure Energy Index: 9815 microvolts squared  Postictal Suppression Index: 91%  Seizure Concordance Index: 99%  Medications  Pre Shock: Toradol 30 mg Brevital 100 mg succinylcholine 110 mg  Post Shock: Versed 4 mg Haldol 5 mg  Seizure Duration: 38 seconds by EMG 61 seconds by EEG   Comments: Follow up into next week  Lungs:  [x]   Clear to auscultation               []  Other:   Heart:    [x]   Regular rhythm             []  irregular rhythm    [x]   Previous H&P reviewed, patient examined and there are NO CHANGES                 []   Previous H&P reviewed, patient examined and there are changes noted.   Alethia Berthold, MD 1/11/201910:21 AM

## 2017-04-07 NOTE — [facility] Group Notes (Signed)
LCSW Group Therapy Note  04/07/2017 1:15pm  Type of Therapy and Topic:  Group Therapy:  Cognitive Distortions  Participation Level:  Did Not Attend   Description of Group:    Patients in this group will be introduced to the topic of cognitive distortions.  Patients will identify and describe cognitive distortions, describe the feelings these distortions create for them.  Patients will identify one or more situations in their personal life where they have cognitively distorted thinking and will verbalize challenging this cognitive distortion through positive thinking skills.  Patients will practice the skill of using positive affirmations to challenge cognitive distortions using affirmation cards.    Therapeutic Goals:  1. Patient will identify two or more cognitive distortions they have used 2. Patient will identify one or more emotions that stem from use of a cognitive distortion 3. Patient will demonstrate use of a positive affirmation to counter a cognitive distortion through discussion and/or role play. 4. Patient will describe one way cognitive distortions can be detrimental to wellness   Summary of Patient Progress:     Therapeutic Modalities:   Cognitive Simms, LCSW 04/07/2017 11:25 AM

## 2017-04-07 NOTE — Plan of Care (Signed)
Pt. Reports sleeping and eating, "good". Pt verbalizes understanding of provided education. Pt. Compliant with medications this evening. Pt. Denies SI/HI and verbally contracts for safety. Pt reports he is able to remain safe while on the unit. Pt does not attend groups. Pt. Reports that today he feels, "the same" as he did yesterday.

## 2017-04-07 NOTE — Progress Notes (Addendum)
Received Michael Schmitt this am after breakfast, he was compliant with his medications. He endorsed feeling very depressed and anxious this am. He requested Vistaril with his AM  medications. His affect is flat. He was noted walking in the hallway with a peer.

## 2017-04-07 NOTE — Progress Notes (Signed)
D:Pt denies SI/HI/AVH. Pt verbally contracts for safety. Pt reports he is able to remain safe while on the unit. Pt. Reports sleeping and eating, "good". Pt is minimally interactive, but does report he is depressed as reported yesterday. Pt. Not specific about severity of depression, but upon presentation seems moderately depressed and is isolative this evening again. Occasionally up in the hallways walking around. Pt when asked reports he feels,  "the same" today as he did yesterday.   A: Q x 15 minute observation checks were completed for safety. Patient was provided with education. Pt verbalizes understanding of provided education.Patient was given scheduled medications. Patient was encourage to attend groups, participate in unit activities and continue with plan of care.   R:Patient is complaint with medication and unit procedures, but does not attend group.             Patient slept for Estimated Hours of 8.30; Precautionary checks every 15 minutes for safety maintained, room free of safety hazards, patient sustains no injury or falls during this shift.

## 2017-04-07 NOTE — Progress Notes (Signed)
[facility] MD Progress Note  04/07/2017 9:16 PM Michael Schmitt  MRN:  [id number] Subjective: Follow-up 47 year old man with severe depression and anxiety.  Patient walks around the unit.  He is very anxious.  He asks me about how long I think ECT will take to work (counsel him give it another week but it could take as long as 4 weeks).  He states he really hopes he feels better soon.  Continues to have SI.  Spends some time in the dayroom.   Principal Problem: Severe recurrent major depression without psychotic features ([place]) Diagnosis:   Patient Active Problem List   Diagnosis Date Noted  . Severe recurrent major depression without psychotic features ([place]) [F33.2] 03/12/2017  . Generalized anxiety disorder [F41.1] 02/05/2017  . Chronic neck pain (Primary Area of Pain) (Left) [M54.2, G89.29] 11/22/2016  . Cervical facet syndrome (Left) [O53.664] 11/22/2016  . Cervical radiculitis (Left) [M54.12] 11/22/2016  . History of cervical spinal surgery [Z98.890] 11/22/2016  . Cervical spondylosis [M47.22] 11/22/2016  . Chronic shoulder pain (Secondary Area of Pain) (Left) [M25.512, G89.29] 11/22/2016  . 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 Chronic low back pain [facility] Area of Pain) (Bilateral) (L>R) [M54.5, G89.29] 11/22/2016  . Lumbar facet syndrome (Bilateral) (L>R) [M47.816] 11/22/2016  . Chronic pain syndrome [G89.4] 11/21/2016  . Long term prescription benzodiazepine use [Z79.899] 11/21/2016  . Opiate use [F11.90] 11/21/2016  . Uncontrolled type 2 diabetes mellitus without complication, without long-term current use of insulin ([place]) [E11.65] 10/19/2016  . Chronic Suicidal ideation [R45.851] 09/15/2016  . Coronary artery disease [I25.10] 09/12/2016  . HTN (hypertension) [I10] 06/26/2016  . GERD (gastroesophageal reflux disease) [K21.9] 06/26/2016  . Alcohol use disorder, severe, 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[place]) [F10.21] 06/26/2016  . Moderate benzodiazepine use disorder ([place])  [F13.20] 06/11/2016  . Sedative, hypnotic or anxiolytic use disorder, severe, dependence ([place]) [F13.20] 05/12/2016  . Ventricular fibrillation ([place]) [I49.01] 04/05/2016  . 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 Cervical spondylosis without myelopathy [M47.812] 07/24/2014  . Severe episode of recurrent major depressive disorder, without psychotic features ([place]) [F33.2] 04/15/2013  . Tobacco use disorder [F17.200] 10/20/2010  . Asthma [J45.909] 10/20/2010   Total Time spent with patient: 20 minutes  Past Psychiatric History: Long history of depression and anxiety failing multiple medicines  Past Medical History:  Past Medical History:  Diagnosis Date  . Anxiety   . Anxiety   . Arthritis    cerv. stenosis, spondylosis, HNP- lower back , has been followed in pain clinic, has  had injection s in cerv. area  . Blood dyscrasia    told that when he was young he was a" free bleeder"  . CAD (coronary artery disease)   . Cervical spondylosis without myelopathy 07/24/2014  . Cervicogenic headache 07/24/2014  . Chronic kidney disease    renal calculi- passed spontaneously  . Depression   . Diabetes mellitus without complication ([place])   . Fatty liver   . GERD (gastroesophageal reflux disease)   . Headache(784.0)   . Hyperlipidemia   . Hypertension   . Mental disorder   . MI, old   . RLS (restless legs syndrome)    detected on sleep study  . Shortness of breath   . Ventricular fibrillation ([place]) 04/05/2016    Past Surgical History:  Procedure Laterality Date  . ANTERIOR CERVICAL DECOMP/DISCECTOMY FUSION  11/13/2011   Procedure: ANTERIOR CERVICAL DECOMPRESSION/DISCECTOMY FUSION 1 LEVEL;  Surgeon: [name], MD;  Location: [place] NEURO ORS;  Service: Neurosurgery;  Laterality: N/A;  Anterior Cervical Decompression/discectomy Fusion. Cervical three-four.  Marland Kitchen CARDIAC CATHETERIZATION N/A 01/01/2015   Procedure: Left Heart Cath and Coronary Angiography;  Surgeon: [name], MD;  Location:  [place] CV LAB;  Service: Cardiovascular;  Laterality: N/A;  . CARDIAC CATHETERIZATION N/A 04/05/2016   Procedure: Left Heart Cath and Coronary Angiography;  Surgeon: Belva Crome, MD;  Location: [place] CV LAB;  Service: Cardiovascular;  Laterality: N/A;  . CARDIAC CATHETERIZATION N/A 04/05/2016   Procedure: Coronary Stent Intervention;  Surgeon: Belva Crome, MD;  Location: [place] CV LAB;  Service: Cardiovascular;  Laterality: N/A;  . CARDIAC CATHETERIZATION N/A 04/05/2016   Procedure: Intravascular Ultrasound/IVUS;  Surgeon: Belva Crome, MD;  Location: [place] CV LAB;  Service: Cardiovascular;  Laterality: N/A;  . NASAL SINUS SURGERY     2005   Family History:  Family History  Problem Relation Age of Onset  . Prostate cancer Father   . Hypertension Mother   . Kidney Stones Mother   . Anxiety disorder Mother   . Depression Mother   . COPD Sister   . Hypertension Sister   . Diabetes Sister   . Depression Sister   . Anxiety disorder Sister   . Seizures Sister   . ADD / ADHD Son   . ADD / ADHD Daughter    Family Psychiatric  History: None Social History:  Social History   Substance and Sexual Activity  Alcohol Use No  . Alcohol/week: 0.0 oz   Comment: last drinked in 3 months.      Social History   Substance and Sexual Activity  Drug Use No    Social History   Socioeconomic History  . Marital status: Widowed    Spouse name: None  . Number of children: 3  . Years of education: 91  . Highest education level: None  Social Needs  . Financial resource strain: None  . Food insecurity - worry: None  . Food insecurity - inability: None  . Transportation needs - medical: None  . Transportation needs - non-medical: None  Occupational History    Comment: unemployed  Tobacco Use  . Smoking status: Current Every Day Smoker    Packs/day: 2.00    Years: 17.00    Pack years: 34.00    Types: Cigarettes, Cigars  . Smokeless tobacco: Former Systems developer  .  Tobacco comment: occassional snuff   Substance and Sexual Activity  . Alcohol use: No    Alcohol/week: 0.0 oz    Comment: last drinked in 3 months.   . Drug use: No  . Sexual activity: Not Currently  Other Topics Concern  . None  Social History Narrative   Patient is right handed.   Patient drinks 2 sodas daily.   Additional Social History:                         Sleep: Fair  Appetite:  Good  Current Medications: Current Facility-Administered Medications  Medication Dose Route Frequency Provider Last Rate Last Dose  . acetaminophen (TYLENOL) tablet 650 mg  650 mg Oral Q6H PRN Clapacs, Madie Reno, MD   650 mg at 04/06/17 1302  . alum & mag hydroxide-simeth (MAALOX/MYLANTA) 200-200-20 MG/5ML suspension 30 mL  30 mL Oral Q4H PRN Clapacs, John T, MD      . atorvastatin (LIPITOR) tablet 80 mg  80 mg Oral q1800 Clapacs, Madie Reno, MD   80 mg at 04/07/17 1646  . carvedilol (COREG) tablet 6.25  mg  6.25 mg Oral BID WC Clapacs, Madie Reno, MD   6.25 mg at 04/07/17 1647  . clopidogrel (PLAVIX) tablet 75 mg  75 mg Oral Daily Clapacs, Madie Reno, MD   75 mg at 04/07/17 0852  . dextrose 5 % solution 500 mL  500 mL Intravenous Once Clapacs, John T, MD      . escitalopram (LEXAPRO) tablet 20 mg  20 mg Oral Daily Clapacs, Madie Reno, MD   20 mg at 04/07/17 0852  . fentaNYL (SUBLIMAZE) injection 25 mcg  25 mcg Intravenous Q5 min PRN Gunnar Bulla, MD      . haloperidol lactate (HALDOL) injection 5 mg  5 mg Intravenous Once Clapacs, John T, MD      . haloperidol lactate (HALDOL) injection 5 mg  5 mg Intravenous Once Clapacs, John T, MD      . haloperidol lactate (HALDOL) injection 5 mg  5 mg Intravenous Once Clapacs, John T, MD      . hydrOXYzine (ATARAX/VISTARIL) tablet 50 mg  50 mg Oral TID PRN Clapacs, Madie Reno, MD   50 mg at 04/07/17 0854  . ketorolac (TORADOL) 30 MG/ML injection 30 mg  30 mg Intravenous Once Clapacs, John T, MD      . magnesium hydroxide (MILK OF MAGNESIA) suspension 30 mL  30 mL Oral  Daily PRN Clapacs, John T, MD      . metFORMIN (GLUCOPHAGE) tablet 500 mg  500 mg Oral BID WC Clapacs, Madie Reno, MD   500 mg at 04/07/17 1647  . midazolam (VERSED) injection 4 mg  4 mg Intravenous Once Clapacs, John T, MD      . midazolam (VERSED) injection 4 mg  4 mg Intravenous Once Clapacs, John T, MD      . mirtazapine (REMERON) tablet 30 mg  30 mg Oral QHS Clapacs, Madie Reno, MD   30 mg at 04/07/17 2103  . ondansetron (ZOFRAN) injection 4 mg  4 mg Intravenous Once PRN Gunnar Bulla, MD      . prazosin (MINIPRESS) capsule 2 mg  2 mg Oral QHS Clapacs, Madie Reno, MD   2 mg at 04/07/17 2103  . QUEtiapine (SEROQUEL) tablet 100 mg  100 mg Oral QHS Clapacs, John T, MD   100 mg at 04/07/17 2103  . traZODone (DESYREL) tablet 100 mg  100 mg Oral QHS PRN Clapacs, Madie Reno, MD   100 mg at 04/07/17 2103    Lab Results:  Results for orders placed or performed during the hospital encounter of 04/04/17 (from the past 48 hour(s))  Glucose, capillary     Status: Abnormal   Collection Time: 04/06/17  7:19 AM  Result Value Ref Range   Glucose-Capillary 147 (H) 65 - 99 mg/dL   Comment 1 Notify RN   Glucose, capillary     Status: Abnormal   Collection Time: 04/06/17 11:15 AM  Result Value Ref Range   Glucose-Capillary 131 (H) 65 - 99 mg/dL    Blood Alcohol level:  Lab Results  Component Value Date   ETH 37 (H) 03/12/2017   ETH <10 54/00/8676    Metabolic Disorder Labs: Lab Results  Component Value Date   HGBA1C 6.0 (H) 03/13/2017   MPG 126 03/13/2017   MPG 177 09/12/2016   Lab Results  Component Value Date   PROLACTIN 18.1 (H) 09/12/2016   PROLACTIN 6.5 05/27/2016   Lab Results  Component Value Date   CHOL 118 03/13/2017   TRIG 325 (H) 03/13/2017  HDL 31 (L) 03/13/2017   CHOLHDL 3.8 03/13/2017   VLDL 65 (H) 03/13/2017   LDLCALC 22 03/13/2017   LDLCALC 37 09/12/2016    Physical Findings: AIMS: Facial and Oral Movements Muscles of Facial Expression: None, normal Lips and Perioral Area:  None, normal Jaw: None, normal Tongue: None, normal,Extremity Movements Upper (arms, wrists, hands, fingers): None, normal Lower (legs, knees, ankles, toes): None, normal, Trunk Movements Neck, shoulders, hips: None, normal, Overall Severity Severity of abnormal movements (highest score from questions above): None, normal Incapacitation due to abnormal movements: None, normal Patient's awareness of abnormal movements (rate only patient's report): No Awareness, Dental Status Current problems with teeth and/or dentures?: No Does patient usually wear dentures?: No  CIWA:    COWS:     Musculoskeletal: Strength & Muscle Tone: within normal limits Gait & Station: normal Patient leans: N/A  Psychiatric Specialty Exam: Physical Exam  Nursing note and vitals reviewed. Constitutional: He appears well-developed and well-nourished.  HENT:  Head: Normocephalic and atraumatic.  Eyes: Conjunctivae are normal. Pupils are equal, round, and reactive to light.  Neck: Normal range of motion.  Respiratory: Effort normal. No respiratory distress.  Musculoskeletal: Normal range of motion.  Neurological: He is alert.  Skin: Skin is warm and dry.  Psychiatric: His mood appears anxious. His speech is delayed. He is slowed and withdrawn. He expresses impulsivity. He exhibits a depressed mood. He expresses suicidal ideation. He exhibits abnormal recent memory.    Review of Systems  Constitutional: Negative.   HENT: Negative.   Eyes: Negative.   Respiratory: Negative.   Cardiovascular: Negative.   Gastrointestinal: Negative.   Musculoskeletal: Negative.   Skin: Negative.   Neurological: Negative.   Psychiatric/Behavioral: Positive for depression, memory loss and suicidal ideas. Negative for hallucinations and substance abuse. The patient is nervous/anxious. The patient does not have insomnia.     Blood pressure 136/85, pulse (!) 58, temperature 97.7 F (36.5 C), temperature source Oral, resp. rate  20, height 6\' 2"  (1.88 m), weight 92.5 kg (204 lb), SpO2 94 %.Body mass index is 26.19 kg/m.  General Appearance: Casual  Eye Contact:  Fair  Speech:  Slow  Volume:  Decreased  Mood:  Depressed  Affect:  Congruent  Thought Process:  Goal Directed  Orientation:  Full (Time, Place, and Person)  Thought Content:  Logical  Suicidal Thoughts:  Yes.  with intent/plan  Homicidal Thoughts:  No  Memory:  Immediate;   Fair Recent;   Poor Remote;   Fair  Judgement:  Impaired  Insight:  Fair  Psychomotor Activity:  Decreased  Concentration:  Concentration: Fair  Recall:  [employer] of Knowledge:  Fair  Language:  Fair  Akathisia:  No  Handed:  Right  AIMS (if indicated):     Assets:  Desire for Improvement Housing  ADL's:  Intact  Cognition:  Impaired,  Mild  Sleep:  Number of Hours: 8     Treatment Plan Summary: Daily contact with patient to assess and evaluate symptoms and progress in treatment, Medication management and Plan Continue ECT next treatment Monday.  Continue current medication.  Strongly encourage patient to be actively engaged on the unit in treatment.  Follow up into next week.  He is still reporting active suicidal ideation and remains agitated with bizarre behavior at times.  Jolene Schimke, MD 04/07/2017, 9:16 PM

## 2017-04-07 NOTE — Progress Notes (Signed)
D:Pt denies SI/HI/AVH. Pt verbally contracts for safety. Pt reports he is able to remain safe while on the unit. Pt. Reports sleeping and eating, "good". Pt is minimally interactive, but does report he is depressed. Pt. Not specific about severity of depression, but upon presentation seems moderately depressed and is isolative this evening. Pt when asked reports he feels, "about the same" today then he did yesterday.   A: Q x 15 minute observation checks were completed for safety. Patient was provided with education. Pt verbalizes understanding of provided education.Patient was given scheduled medications. Patient  was encourage to attend groups, participate in unit activities and continue with plan of care.   R:Patient is complaint with medication and unit procedures. Pt attends recreation therapy.            Patient slept for Estimated Hours of 7.45; Precautionary checks every 15 minutes for safety maintained, room free of safety hazards, patient sustains no injury or falls during this shift.

## 2017-04-08 NOTE — [facility] Group Notes (Signed)
[name]:  (Nursing/MHT/Case Management/Adjunct)  Date:  04/08/2017  Time:  5:58 AM  Type of Therapy:  Psychoeducational Skills  Participation Level:  Active  Participation Quality:  Appropriate  Affect:  Appropriate  Cognitive:  Appropriate  Insight:  Appropriate  Engagement in Group:  Engaged  Modes of Intervention:  Discussion, Socialization and Support  Summary of Progress/Problems:  [name] 04/08/2017, 5:58 AM

## 2017-04-08 NOTE — Progress Notes (Signed)
[facility] MD Progress Note  04/08/2017 8:50 PM Kerri Asche  MRN:  [id number] Subjective: Follow-up 47 year old man with severe depression and anxiety.   I sit with patient and we talk as he eats his snack.  He states he continues to feel depressed and he is having passive SI.  Provide supportive therapy.  He is optimistic ECT just needs some more time.  He is try to take things day by day.  He doesn't have a plan to hurt himself on the unit.   Principal Problem: Severe recurrent major depression without psychotic features ([place]) Diagnosis:   Patient Active Problem List   Diagnosis Date Noted  . Severe recurrent major depression without psychotic features ([place]) [F33.2] 03/12/2017  . Generalized anxiety disorder [F41.1] 02/05/2017  . Chronic neck pain (Primary Area of Pain) (Left) [M54.2, G89.29] 11/22/2016  . Cervical facet syndrome (Left) [J67.341] 11/22/2016  . Cervical radiculitis (Left) [M54.12] 11/22/2016  . History of cervical spinal surgery [Z98.890] 11/22/2016  . Cervical spondylosis [M47.22] 11/22/2016  . Chronic shoulder pain (Secondary Area of Pain) (Left) [M25.512, G89.29] 11/22/2016  . 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 Chronic low back pain [facility] Area of Pain) (Bilateral) (L>R) [M54.5, G89.29] 11/22/2016  . Lumbar facet syndrome (Bilateral) (L>R) [M47.816] 11/22/2016  . Chronic pain syndrome [G89.4] 11/21/2016  . Long term prescription benzodiazepine use [Z79.899] 11/21/2016  . Opiate use [F11.90] 11/21/2016  . Uncontrolled type 2 diabetes mellitus without complication, without long-term current use of insulin ([place]) [E11.65] 10/19/2016  . Chronic Suicidal ideation [R45.851] 09/15/2016  . Coronary artery disease [I25.10] 09/12/2016  . HTN (hypertension) [I10] 06/26/2016  . GERD (gastroesophageal reflux disease) [K21.9] 06/26/2016  . Alcohol use disorder, severe, 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[place]) [F10.21] 06/26/2016  . Moderate benzodiazepine use disorder  ([place]) [F13.20] 06/11/2016  . Sedative, hypnotic or anxiolytic use disorder, severe, dependence ([place]) [F13.20] 05/12/2016  . Ventricular fibrillation ([place]) [I49.01] 04/05/2016  . 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 Cervical spondylosis without myelopathy [M47.812] 07/24/2014  . Severe episode of recurrent major depressive disorder, without psychotic features ([place]) [F33.2] 04/15/2013  . Tobacco use disorder [F17.200] 10/20/2010  . Asthma [J45.909] 10/20/2010   Total Time spent with patient: 20 minutes  Past Psychiatric History: Long history of depression and anxiety failing multiple medicines  Past Medical History:  Past Medical History:  Diagnosis Date  . Anxiety   . Anxiety   . Arthritis    cerv. stenosis, spondylosis, HNP- lower back , has been followed in pain clinic, has  had injection s in cerv. area  . Blood dyscrasia    told that when he was young he was a" free bleeder"  . CAD (coronary artery disease)   . Cervical spondylosis without myelopathy 07/24/2014  . Cervicogenic headache 07/24/2014  . Chronic kidney disease    renal calculi- passed spontaneously  . Depression   . Diabetes mellitus without complication ([place])   . Fatty liver   . GERD (gastroesophageal reflux disease)   . Headache(784.0)   . Hyperlipidemia   . Hypertension   . Mental disorder   . MI, old   . RLS (restless legs syndrome)    detected on sleep study  . Shortness of breath   . Ventricular fibrillation ([place]) 04/05/2016    Past Surgical History:  Procedure Laterality Date  . ANTERIOR CERVICAL DECOMP/DISCECTOMY FUSION  11/13/2011   Procedure: ANTERIOR CERVICAL DECOMPRESSION/DISCECTOMY FUSION 1 LEVEL;  Surgeon: [name], MD;  Location: [place] [facility];  Service: Neurosurgery;  Laterality: N/A;  Anterior Cervical Decompression/discectomy Fusion. Cervical three-four.  Marland Kitchen CARDIAC CATHETERIZATION N/A 01/01/2015   Procedure: Left Heart Cath and Coronary Angiography;  Surgeon: [name], MD;   Location: [facility];  Service: Cardiovascular;  Laterality: N/A;  . CARDIAC CATHETERIZATION N/A 04/05/2016   Procedure: Left Heart Cath and Coronary Angiography;  Surgeon: Belva Crome, MD;  Location: [facility];  Service: Cardiovascular;  Laterality: N/A;  . CARDIAC CATHETERIZATION N/A 04/05/2016   Procedure: Coronary Stent Intervention;  Surgeon: Belva Crome, MD;  Location: [facility];  Service: Cardiovascular;  Laterality: N/A;  . CARDIAC CATHETERIZATION N/A 04/05/2016   Procedure: Intravascular Ultrasound/IVUS;  Surgeon: Belva Crome, MD;  Location: [facility];  Service: Cardiovascular;  Laterality: N/A;  . NASAL SINUS SURGERY     2005   Family History:  Family History  Problem Relation Age of Onset  . Prostate cancer Father   . Hypertension Mother   . Kidney Stones Mother   . Anxiety disorder Mother   . Depression Mother   . COPD Sister   . Hypertension Sister   . Diabetes Sister   . Depression Sister   . Anxiety disorder Sister   . Seizures Sister   . ADD / ADHD Son   . ADD / ADHD Daughter    Family Psychiatric  History: None Social History:  Social History   Substance and Sexual Activity  Alcohol Use No  . Alcohol/week: 0.0 oz   Comment: last drinked in 3 months.      Social History   Substance and Sexual Activity  Drug Use No    Social History   Socioeconomic History  . Marital status: Widowed    Spouse name: None  . Number of children: 3  . Years of education: 78  . Highest education level: None  Social Needs  . Financial resource strain: None  . Food insecurity - worry: None  . Food insecurity - inability: None  . Transportation needs - medical: None  . Transportation needs - non-medical: None  Occupational History    Comment: unemployed  Tobacco Use  . Smoking status: Current Every Day Smoker    Packs/day: 2.00    Years: 17.00    Pack years: 34.00    Types: Cigarettes, Cigars  . Smokeless tobacco: Former Systems developer   . Tobacco comment: occassional snuff   Substance and Sexual Activity  . Alcohol use: No    Alcohol/week: 0.0 oz    Comment: last drinked in 3 months.   . Drug use: No  . Sexual activity: Not Currently  Other Topics Concern  . None  Social History Narrative   Patient is right handed.   Patient drinks 2 sodas daily.   Additional Social History:                         Sleep: Fair  Appetite:  Good  Current Medications: Current Facility-Administered Medications  Medication Dose Route Frequency Provider Last Rate Last Dose  . acetaminophen (TYLENOL) tablet 650 mg  650 mg Oral Q6H PRN Clapacs, Madie Reno, MD   650 mg at 04/06/17 1302  . alum & mag hydroxide-simeth (MAALOX/MYLANTA) 200-200-20 MG/5ML suspension 30 mL  30 mL Oral Q4H PRN Clapacs, John T, MD      . atorvastatin (LIPITOR) tablet 80 mg  80 mg Oral q1800 Clapacs, Madie Reno, MD   80 mg at 04/08/17 1814  . carvedilol (  COREG) tablet 6.25 mg  6.25 mg Oral BID WC Clapacs, Madie Reno, MD   6.25 mg at 04/08/17 1814  . clopidogrel (PLAVIX) tablet 75 mg  75 mg Oral Daily Clapacs, Madie Reno, MD   75 mg at 04/08/17 0854  . dextrose 5 % solution 500 mL  500 mL Intravenous Once Clapacs, John T, MD      . escitalopram (LEXAPRO) tablet 20 mg  20 mg Oral Daily Clapacs, Madie Reno, MD   20 mg at 04/08/17 0854  . fentaNYL (SUBLIMAZE) injection 25 mcg  25 mcg Intravenous Q5 min PRN Gunnar Bulla, MD      . haloperidol lactate (HALDOL) injection 5 mg  5 mg Intravenous Once Clapacs, John T, MD      . haloperidol lactate (HALDOL) injection 5 mg  5 mg Intravenous Once Clapacs, John T, MD      . haloperidol lactate (HALDOL) injection 5 mg  5 mg Intravenous Once Clapacs, John T, MD      . hydrOXYzine (ATARAX/VISTARIL) tablet 50 mg  50 mg Oral TID PRN Clapacs, Madie Reno, MD   50 mg at 04/07/17 0854  . ketorolac (TORADOL) 30 MG/ML injection 30 mg  30 mg Intravenous Once Clapacs, John T, MD      . magnesium hydroxide (MILK OF MAGNESIA) suspension 30 mL  30 mL  Oral Daily PRN Clapacs, John T, MD      . metFORMIN (GLUCOPHAGE) tablet 500 mg  500 mg Oral BID WC Clapacs, Madie Reno, MD   500 mg at 04/08/17 1814  . midazolam (VERSED) injection 4 mg  4 mg Intravenous Once Clapacs, John T, MD      . midazolam (VERSED) injection 4 mg  4 mg Intravenous Once Clapacs, John T, MD      . mirtazapine (REMERON) tablet 30 mg  30 mg Oral QHS Clapacs, Madie Reno, MD   30 mg at 04/07/17 2103  . ondansetron (ZOFRAN) injection 4 mg  4 mg Intravenous Once PRN Gunnar Bulla, MD      . prazosin (MINIPRESS) capsule 2 mg  2 mg Oral QHS Clapacs, Madie Reno, MD   2 mg at 04/07/17 2103  . QUEtiapine (SEROQUEL) tablet 100 mg  100 mg Oral QHS Clapacs, John T, MD   100 mg at 04/07/17 2103  . traZODone (DESYREL) tablet 100 mg  100 mg Oral QHS PRN Clapacs, Madie Reno, MD   100 mg at 04/07/17 2103    Lab Results:  No results found for this or any previous visit (from the past 48 hour(s)).  Blood Alcohol level:  Lab Results  Component Value Date   ETH 37 (H) 03/12/2017   ETH <10 17/61/6073    Metabolic Disorder Labs: Lab Results  Component Value Date   HGBA1C 6.0 (H) 03/13/2017   MPG 126 03/13/2017   MPG 177 09/12/2016   Lab Results  Component Value Date   PROLACTIN 18.1 (H) 09/12/2016   PROLACTIN 6.5 05/27/2016   Lab Results  Component Value Date   CHOL 118 03/13/2017   TRIG 325 (H) 03/13/2017   HDL 31 (L) 03/13/2017   CHOLHDL 3.8 03/13/2017   VLDL 65 (H) 03/13/2017   LDLCALC 22 03/13/2017   LDLCALC 37 09/12/2016    Physical Findings: AIMS: Facial and Oral Movements Muscles of Facial Expression: None, normal Lips and Perioral Area: None, normal Jaw: None, normal Tongue: None, normal,Extremity Movements Upper (arms, wrists, hands, fingers): None, normal Lower (legs, knees, ankles, toes): None, normal, Trunk Movements  Neck, shoulders, hips: None, normal, Overall Severity Severity of abnormal movements (highest score from questions above): None, normal Incapacitation due  to abnormal movements: None, normal Patient's awareness of abnormal movements (rate only patient's report): No Awareness, Dental Status Current problems with teeth and/or dentures?: No Does patient usually wear dentures?: No  CIWA:    COWS:     Musculoskeletal: Strength & Muscle Tone: within normal limits Gait & Station: normal Patient leans: N/A  Psychiatric Specialty Exam: Physical Exam  Nursing note and vitals reviewed. Constitutional: He appears well-developed and well-nourished.  HENT:  Head: Normocephalic and atraumatic.  Eyes: Conjunctivae are normal. Pupils are equal, round, and reactive to light.  Neck: Normal range of motion.  Respiratory: Effort normal. No respiratory distress.  Musculoskeletal: Normal range of motion.  Neurological: He is alert.  Skin: Skin is warm and dry.  Psychiatric: His mood appears anxious. His speech is delayed. He is slowed and withdrawn. He expresses impulsivity. He exhibits a depressed mood. He expresses suicidal ideation. He exhibits abnormal recent memory.    Review of Systems  Constitutional: Negative.   HENT: Negative.   Eyes: Negative.   Respiratory: Negative.   Cardiovascular: Negative.   Gastrointestinal: Negative.   Musculoskeletal: Negative.   Skin: Negative.   Neurological: Negative.   Psychiatric/Behavioral: Positive for depression, memory loss and suicidal ideas. Negative for hallucinations and substance abuse. The patient is nervous/anxious. The patient does not have insomnia.     Blood pressure 124/78, pulse 73, temperature 97.7 F (36.5 C), temperature source Oral, resp. rate 18, height 6\' 2"  (1.88 m), weight 92.5 kg (204 lb), SpO2 94 %.Body mass index is 26.19 kg/m.  General Appearance: Casual  Eye Contact:  Fair  Speech:  Slow  Volume:  Decreased  Mood:  Depressed  Affect:  Congruent  Thought Process:  Goal Directed  Orientation:  Full (Time, Place, and Person)  Thought Content:  Logical  Suicidal Thoughts:   Yes.  with intent/plan  Homicidal Thoughts:  No  Memory:  Immediate;   Fair Recent;   Poor Remote;   Fair  Judgement:  Impaired  Insight:  Fair  Psychomotor Activity:  Decreased  Concentration:  Concentration: Fair  Recall:  [employer] of Knowledge:  Fair  Language:  Fair  Akathisia:  No  Handed:  Right  AIMS (if indicated):     Assets:  Desire for Improvement Housing  ADL's:  Intact  Cognition:  Impaired,  Mild  Sleep:  Number of Hours: 8     Treatment Plan Summary: Daily contact with patient to assess and evaluate symptoms and progress in treatment, Medication management and Plan Continue ECT next treatment Monday.  Continue current medication.  Strongly encourage patient to be actively engaged on the unit in treatment.  Follow up into next week.  He is still reporting active suicidal ideation and remains agitated with bizarre behavior at times.  Jolene Schimke, MD 04/08/2017, 8:50 PM

## 2017-04-08 NOTE — BHH Group Notes (Signed)
LCSW Group Therapy Note 04/08/2017 1:15pm  Type of Therapy and Topic: Group Therapy: Feelings Around Returning Home & Establishing a Supportive Framework and Supporting Oneself When Supports Not Available  Participation Level: Minimal  Description of Group:  Patients first processed thoughts and feelings about upcoming discharge. These included fears of upcoming changes, lack of change, new living environments, judgements and expectations from others and overall stigma of mental health issues. The group then discussed the definition of a supportive framework, what that looks and feels like, and how do to discern it from an unhealthy non-supportive network. The group identified different types of supports as well as what to do when your family/friends are less than helpful or unavailable  Therapeutic Goals  1. Patient will identify one healthy supportive network that they can use at discharge. 2. Patient will identify one factor of a supportive framework and how to tell it from an unhealthy network. 3. Patient able to identify one coping skill to use when they do not have positive supports from others. 4. Patient will demonstrate ability to communicate their needs through discussion and/or role plays.  Summary of Patient Progress:  Pt engaged during group session. As patients processed their anxiety about discharge and described healthy supports patient shared he is not ready to go back home.    Therapeutic Modalities Cognitive Behavioral Therapy Motivational Interviewing   Cheree Ditto, LCSW 04/08/2017 12:29 PM

## 2017-04-08 NOTE — Plan of Care (Signed)
afety: Ability to remain free from injury will improve 04/08/2017 1016 - Progressing by Rise Mu, RN Note Remains safe and free of injury on the unnit   Education: Emotional status will improve 04/08/2017 1016 - Not Progressing by Rise Mu, RN Note Affect flat.  Continues to endorse depression.  Rates depression, anxiety and hopelessness as 10/10.   Self-Concept: Ability to disclose and discuss suicidal ideas will improve 04/08/2017 1016 - Progressing by Rise Mu, RN Note Denies SI at this time   Self-Concept: Ability to verbalize positive feelings about self will improve 04/08/2017 1016 - Not Progressing by Rise Mu, RN Note Unable to verbalize anything positive about himself.  Only states "I'm tired"   Spiritual Needs Ability to function at adequate level 04/08/2017 1016 - Progressing by Rise Mu, RN Note Maintains personal care chores appropriately

## 2017-04-08 NOTE — Plan of Care (Signed)
Pt. Reports sleeping, "good" and eating, "okay". Pt reports, "I've been snacking a lot". Pt verbalizes understanding of provided education. Pt. Compliant with medications this evening. Pt. Denies SI/HI and verbally contracts for safety. Pt reports he is able to remain safe while on the unit. Pt does attend groups this evening. Pt. Reports that today he feels, "the same" as he did yesterday.

## 2017-04-08 NOTE — Progress Notes (Signed)
D:Pt denies SI/HI/AVH. Pt verbally contracts for safety.Pt reports he is able to remain safe while on the unit. Pt. Reports sleeping, "good" and eating, "okay". Pt reports, "Iv'e been snacking a lot".Pt isminimally interactive, but does report he is depressed as reported yesterday. Pt. Not specific about severity of depression, but upon presentation seems moderately depressed and is isolative this evening again. Pt eager for ECT to work for him. Occasionally up in the hallways walking around. Pt when asked reports he feels,  "the same" today as he did yesterday.   A: Q x 15 minute observation checks were completed for safety. Patient was provided with education.Pt verbalizes understanding of provided education.Patient was given scheduled medications. Patient was encourage to attend groups, participate in unit activities and continue with plan of care.   R:Patient is complaint with medication and unit procedures, and does attend group.             Patient slept for Estimated Hours of 7; Precautionary checks every 15 minutes for safety maintained, room free of safety hazards, patient sustains no injury or falls during this shift.

## 2017-04-09 ENCOUNTER — Inpatient Hospital Stay: Payer: Medicare Other | Admitting: Anesthesiology

## 2017-04-09 ENCOUNTER — Other Ambulatory Visit: Payer: Self-pay | Admitting: Psychiatry

## 2017-04-09 LAB — GLUCOSE, CAPILLARY: GLUCOSE-CAPILLARY: 144 mg/dL — AB (ref 65–99)

## 2017-04-09 MED ORDER — CHLORPROMAZINE HCL 100 MG PO TABS
100.0000 mg | ORAL_TABLET | Freq: Once | ORAL | Status: AC
  Administered 2017-04-09: 100 mg via ORAL
  Filled 2017-04-09: qty 1

## 2017-04-09 MED ORDER — SUCCINYLCHOLINE CHLORIDE 20 MG/ML IJ SOLN
INTRAMUSCULAR | Status: DC | PRN
  Administered 2017-04-09: 150 mg via INTRAVENOUS

## 2017-04-09 MED ORDER — MIDAZOLAM HCL 5 MG/5ML IJ SOLN
INTRAMUSCULAR | Status: DC | PRN
  Administered 2017-04-09: 4 mg via INTRAVENOUS

## 2017-04-09 MED ORDER — HALOPERIDOL LACTATE 5 MG/ML IJ SOLN
5.0000 mg | Freq: Once | INTRAMUSCULAR | Status: DC
  Filled 2017-04-09 (×2): qty 1

## 2017-04-09 MED ORDER — METHOHEXITAL SODIUM 100 MG/10ML IV SOSY
PREFILLED_SYRINGE | INTRAVENOUS | Status: DC | PRN
  Administered 2017-04-09: 100 mg via INTRAVENOUS

## 2017-04-09 MED ORDER — HALOPERIDOL LACTATE 5 MG/ML IJ SOLN
INTRAMUSCULAR | Status: DC | PRN
  Administered 2017-04-09: 5 mg via INTRAVENOUS

## 2017-04-09 MED ORDER — DEXTROSE 5 % IV SOLN: 500.0000 mL | Freq: Once | INTRAVENOUS | Status: DC

## 2017-04-09 MED ORDER — KETOROLAC TROMETHAMINE 30 MG/ML IJ SOLN
30.0000 mg | Freq: Once | INTRAMUSCULAR | Status: AC
  Administered 2017-04-09: 30 mg via INTRAVENOUS

## 2017-04-09 MED ORDER — MIDAZOLAM HCL 2 MG/2ML IJ SOLN
INTRAMUSCULAR | Status: AC
  Filled 2017-04-09: qty 4

## 2017-04-09 MED ORDER — HALOPERIDOL LACTATE 5 MG/ML IJ SOLN
INTRAMUSCULAR | Status: AC
  Filled 2017-04-09: qty 1

## 2017-04-09 MED ORDER — METHOHEXITAL SODIUM 0.5 G IJ SOLR
INTRAMUSCULAR | Status: AC
  Filled 2017-04-09: qty 500

## 2017-04-09 MED ORDER — KETOROLAC TROMETHAMINE 30 MG/ML IJ SOLN
INTRAMUSCULAR | Status: AC
  Administered 2017-04-09: 30 mg via INTRAVENOUS
  Filled 2017-04-09: qty 1

## 2017-04-09 MED ORDER — ONDANSETRON HCL 4 MG/2ML IJ SOLN: 4.0000 mg | Freq: Once | INTRAMUSCULAR | Status: DC | PRN

## 2017-04-09 MED ORDER — MIDAZOLAM HCL 2 MG/2ML IJ SOLN: 4.0000 mg | Freq: Once | INTRAMUSCULAR | Status: DC

## 2017-04-09 MED ORDER — SUCCINYLCHOLINE CHLORIDE 20 MG/ML IJ SOLN
INTRAMUSCULAR | Status: AC
  Filled 2017-04-09: qty 1

## 2017-04-09 MED ORDER — DEXTROSE-NACL 5-0.45 % IV SOLN
INTRAVENOUS | Status: DC
  Administered 2017-04-09: 10:00:00 via INTRAVENOUS

## 2017-04-09 NOTE — Anesthesia Preprocedure Evaluation (Signed)
Anesthesia Evaluation  Patient identified by MRN, date of birth, ID band Patient awake    Reviewed: Allergy & Precautions, NPO status , Patient's Chart, lab work & pertinent test results, reviewed documented beta blocker date and time   Airway Mallampati: II  TM Distance: >3 FB     Dental  (+) Chipped   Pulmonary shortness of breath, asthma , Current Smoker,           Cardiovascular hypertension, Pt. on medications and Pt. on home beta blockers + CAD and + Past MI       Neuro/Psych  Headaches, PSYCHIATRIC DISORDERS Anxiety Depression  Neuromuscular disease    GI/Hepatic GERD  Controlled,  Endo/Other  diabetes, Type 2  Renal/GU Renal disease     Musculoskeletal  (+) Arthritis ,   Abdominal   Peds  Hematology  (+) Blood dyscrasia, ,   Anesthesia Other Findings   Reproductive/Obstetrics                             Anesthesia Physical  Anesthesia Plan  ASA: III  Anesthesia Plan: General   Post-op Pain Management:    Induction: Intravenous  PONV Risk Score and Plan:   Airway Management Planned:   Additional Equipment:   Intra-op Plan:   Post-operative Plan:   Informed Consent: I have reviewed the patients History and Physical, chart, labs and discussed the procedure including the risks, benefits and alternatives for the proposed anesthesia with the patient or authorized representative who has indicated his/her understanding and acceptance.     Plan Discussed with: CRNA  Anesthesia Plan Comments:         Anesthesia Quick Evaluation

## 2017-04-09 NOTE — Plan of Care (Signed)
  Progressing Activity: Interest or engagement in activities will improve 04/09/2017 2009 - Progressing by Clemens Catholic, RN Sleeping patterns will improve 04/09/2017 2009 - Progressing by Clemens Catholic, RN Education: Emotional status will improve 04/09/2017 2009 - Progressing by Clemens Catholic, RN Verbalization of understanding the information provided will improve 04/09/2017 2009 - Progressing by Clemens Catholic, RN Coping: Ability to verbalize frustrations and anger appropriately will improve 04/09/2017 2009 - Progressing by Clemens Catholic, RN Ability to demonstrate self-control will improve 04/09/2017 2009 - Progressing by Clemens Catholic, RN

## 2017-04-09 NOTE — Progress Notes (Signed)
Recreation Therapy Notes  Date: 01.14.2019  Time: 1:00 PM  Location: Craft Room  Behavioral response: N/A  Intervention Topic: Self-Esteem  Discussion/Intervention: Patient did not attend group.  Clinical Observations/Feedback:  Patient did not attend group.  Aser Nylund LRT/CTRS          Kainon Varady 04/09/2017 3:00 PM

## 2017-04-09 NOTE — Procedures (Signed)
ECT SERVICES Physician's Interval Evaluation & Treatment Note  Patient Identification: Boston Cookson MRN:  [id number] Date of Evaluation:  04/09/2017 TX #: [id number]  MADRS:   MMSE:   P.E. Findings:  No change to physical exam  Psychiatric Interval Note:  Still depressed and anxious passive suicidal thoughts.  Subjective:  Patient is a 47 y.o. male seen for evaluation for Electroconvulsive Therapy. Still feeling bad and depressed not feeling like he has any change  Treatment Summary:   []   Right Unilateral             [x]  Bilateral   % Energy : 1.0 ms 50%   Impedance: 820 ohms  Seizure Energy Index: 8495 V squared  Postictal Suppression Index: 44%  Seizure Concordance Index: 99%  Medications  Pre Shock: Toradol 30 mg Brevital 100 mg succinylcholine 110 mg  Post Shock: Versed 4 mg Haldol 5 mg  Seizure Duration: 37 seconds by EMG 68 seconds by EEG   Comments: Follow-up Wednesday and continue 3 times a week schedule  Lungs:  [x]   Clear to auscultation               []  Other:   Heart:    [x]   Regular rhythm             []  irregular rhythm    [x]   Previous H&P reviewed, patient examined and there are NO CHANGES                 []   Previous H&P reviewed, patient examined and there are changes noted.   Alethia Berthold, MD 1/14/201910:13 AM

## 2017-04-09 NOTE — Progress Notes (Signed)
Patient was screaming and states "I am not doing any good.I want to die".Patient had a dose of Thorazine.Patient denies that it helped him.No screaming noted after that.Had ECT today.Patient verbalized that his appetite is poor now.Not attended groups.Support and encouragement given.

## 2017-04-09 NOTE — H&P (Signed)
Michael Schmitt is an 47 y.o. male.   Chief Complaint: Patient continues to feel depressed HPI: History of recurrent severe depression currently with worsening depression and anxiety and suicidal ideation in the middle of initial ECT treatment  Past Medical History:  Diagnosis Date  . Anxiety   . Anxiety   . Arthritis    cerv. stenosis, spondylosis, HNP- lower back , has been followed in pain clinic, has  had injection s in cerv. area  . Blood dyscrasia    told that when he was young he was a" free bleeder"  . CAD (coronary artery disease)   . Cervical spondylosis without myelopathy 07/24/2014  . Cervicogenic headache 07/24/2014  . Chronic kidney disease    renal calculi- passed spontaneously  . Depression   . Diabetes mellitus without complication ([place])   . Fatty liver   . GERD (gastroesophageal reflux disease)   . Headache(784.0)   . Hyperlipidemia   . Hypertension   . Mental disorder   . MI, old   . RLS (restless legs syndrome)    detected on sleep study  . Shortness of breath   . Ventricular fibrillation ([place]) 04/05/2016    Past Surgical History:  Procedure Laterality Date  . ANTERIOR CERVICAL DECOMP/DISCECTOMY FUSION  11/13/2011   Procedure: ANTERIOR CERVICAL DECOMPRESSION/DISCECTOMY FUSION 1 LEVEL;  Surgeon: [name], MD;  Location: [place] [facility];  Service: Neurosurgery;  Laterality: N/A;  Anterior Cervical Decompression/discectomy Fusion. Cervical three-four.  Marland Kitchen CARDIAC CATHETERIZATION N/A 01/01/2015   Procedure: Left Heart Cath and Coronary Angiography;  Surgeon: [name], MD;  Location: [place] [facility];  Service: Cardiovascular;  Laterality: N/A;  . CARDIAC CATHETERIZATION N/A 04/05/2016   Procedure: Left Heart Cath and Coronary Angiography;  Surgeon: Belva Crome, MD;  Location: [place] [facility];  Service: Cardiovascular;  Laterality: N/A;  . CARDIAC CATHETERIZATION N/A 04/05/2016   Procedure: Coronary Stent Intervention;  Surgeon: Belva Crome, MD;   Location: [place] [facility];  Service: Cardiovascular;  Laterality: N/A;  . CARDIAC CATHETERIZATION N/A 04/05/2016   Procedure: Intravascular Ultrasound/IVUS;  Surgeon: Belva Crome, MD;  Location: [place] [facility];  Service: Cardiovascular;  Laterality: N/A;  . NASAL SINUS SURGERY     2005    Family History  Problem Relation Age of Onset  . Prostate cancer Father   . Hypertension Mother   . Kidney Stones Mother   . Anxiety disorder Mother   . Depression Mother   . COPD Sister   . Hypertension Sister   . Diabetes Sister   . Depression Sister   . Anxiety disorder Sister   . Seizures Sister   . ADD / ADHD Son   . ADD / ADHD Daughter    Social History:  reports that he has been smoking cigarettes and cigars.  He has a 34.00 pack-year smoking history. He has quit using smokeless tobacco. He reports that he does not drink alcohol or use drugs.  Allergies:  Allergies  Allergen Reactions  . Prednisone Other (See Comments)    Hypertension, makes him feel spacey  . Hydrocodone Other (See Comments)    Headache, irritable  . Varenicline Other (See Comments)    Suicidal thoughts    Medications Prior to Admission  Medication Sig Dispense Refill  . aspirin EC 81 MG EC tablet Take 1 tablet (81 mg total) by mouth daily. 30 tablet 1  . atorvastatin (LIPITOR) 80 MG tablet Take 1 tablet (80 mg total) by mouth  daily at 6 PM. 30 tablet 1  . carvedilol (COREG) 6.25 MG tablet Take 1 tablet (6.25 mg total) by mouth 2 (two) times daily with a meal. 60 tablet 1  . clopidogrel (PLAVIX) 75 MG tablet Take 1 tablet (75 mg total) by mouth daily. 30 tablet 1  . escitalopram (LEXAPRO) 20 MG tablet Take 1 tablet (20 mg total) by mouth daily. 30 tablet 1  . hydrOXYzine (ATARAX/VISTARIL) 50 MG tablet Take 1 tablet (50 mg total) by mouth 3 (three) times daily as needed for anxiety. 90 tablet 1  . lurasidone 60 MG TABS Take 1 tablet (60 mg total) by mouth daily with breakfast. 30 tablet 1  . metFORMIN  (GLUCOPHAGE) 500 MG tablet Take 1 tablet (500 mg total) by mouth 2 (two) times daily with a meal. 60 tablet 1  . mirtazapine (REMERON) 15 MG tablet Take 1 tablet (15 mg total) by mouth at bedtime. 30 tablet 0  . nitroGLYCERIN (NITROSTAT) 0.4 MG SL tablet Place 1 tablet (0.4 mg total) under the tongue every 5 (five) minutes x 3 doses as needed. 10 tablet 0  . prazosin (MINIPRESS) 2 MG capsule Take 1 capsule (2 mg total) by mouth at bedtime. 30 capsule 1  . traZODone (DESYREL) 100 MG tablet Take 1 tablet (100 mg total) by mouth at bedtime as needed for sleep. 30 tablet 1  . zolpidem (AMBIEN) 10 MG tablet Take 1 tablet (10 mg total) by mouth at bedtime. 30 tablet 0    Results for orders placed or performed during the hospital encounter of 04/04/17 (from the past 48 hour(s))  Glucose, capillary     Status: Abnormal   Collection Time: 04/09/17  6:47 AM  Result Value Ref Range   Glucose-Capillary 144 (H) 65 - 99 mg/dL   No results found.  Review of Systems  Constitutional: Negative.   HENT: Negative.   Eyes: Negative.   Respiratory: Negative.   Cardiovascular: Negative.   Gastrointestinal: Negative.   Musculoskeletal: Negative.   Skin: Negative.   Neurological: Negative.   Psychiatric/Behavioral: Positive for depression and suicidal ideas. Negative for hallucinations, memory loss and substance abuse. The patient is nervous/anxious. The patient does not have insomnia.     Blood pressure (!) 142/96, pulse 71, temperature 97.8 F (36.6 C), temperature source Oral, resp. rate 16, height 6\' 2"  (1.88 m), weight 90.7 kg (200 lb), SpO2 99 %. Physical Exam  Nursing note and vitals reviewed. Constitutional: He appears well-developed and well-nourished.  HENT:  Head: Normocephalic and atraumatic.  Eyes: Conjunctivae are normal. Pupils are equal, round, and reactive to light.  Neck: Normal range of motion.  Cardiovascular: Regular rhythm and normal heart sounds.  Respiratory: Effort normal. No  respiratory distress.  GI: Soft.  Musculoskeletal: Normal range of motion.  Neurological: He is alert.  Skin: Skin is warm and dry.  Psychiatric: His mood appears anxious. His speech is delayed. He is slowed. Cognition and memory are normal. He expresses impulsivity. He exhibits a depressed mood. He expresses suicidal ideation. He expresses no suicidal plans.     Assessment/Plan Treatment today.  Reassess suicidality possibility of discharge while continuing plan for index course.  Alethia Berthold, MD 04/09/2017, 10:11 AM

## 2017-04-09 NOTE — [facility] Group Notes (Signed)
[facility] Group Notes:  (Nursing/MHT/Case Management/Adjunct)  Date:  04/09/2017  Time:  4:29 AM  Type of Therapy:  Group Therapy  Participation Level:  Active  Participation Quality:  Appropriate  Affect:  Appropriate  Cognitive:  Appropriate  Insight:  Appropriate  Engagement in Group:  Engaged  Modes of Intervention:  Discussion  Summary of Progress/Problems:  Michael Schmitt 04/09/2017, 4:29 AM

## 2017-04-09 NOTE — Transfer of Care (Signed)
Immediate Anesthesia Transfer of Care Note  Patient: Michael Schmitt  Procedure(s) Performed: ECT TX  Patient Location: PACU  Anesthesia Type:General  Level of Consciousness: awake  Airway & Oxygen Therapy: Patient Spontanous Breathing and Patient connected to face mask oxygen  Post-op Assessment: Report given to RN and Post -op Vital signs reviewed and stable  Post vital signs: Reviewed  Last Vitals:  Vitals:   04/09/17 0826 04/09/17 1029  BP: (!) 142/96 (!) 140/97  Pulse: 71 93  Resp: 16 18  Temp: 36.6 C   SpO2:  98%    Last Pain:  Vitals:   04/09/17 0826  TempSrc: Oral  PainSc: 0-No pain         Complications: No apparent anesthesia complications

## 2017-04-09 NOTE — [facility] Group Notes (Signed)
[facility] Group Notes:  (Nursing/MHT/Case Management/Adjunct)  Date:  04/09/2017  Time:  11:27 PM  Type of Therapy:  Group Therapy  Participation Level:  Active  Participation Quality:  Supportive  Affect:  Appropriate  Cognitive:  Alert  Insight:  Good  Engagement in Group:  Engaged  Modes of Intervention:  Support  Summary of Progress/Problems:  Michael Schmitt 04/09/2017, 11:27 PM

## 2017-04-09 NOTE — Anesthesia Postprocedure Evaluation (Signed)
Anesthesia Post Note  Patient: Michael Schmitt  Procedure(s) Performed: ECT TX  Patient location during evaluation: Other Anesthesia Type: General Level of consciousness: awake and alert Pain management: pain level controlled Vital Signs Assessment: post-procedure vital signs reviewed and stable Respiratory status: spontaneous breathing and respiratory function stable Cardiovascular status: stable Anesthetic complications: no     Last Vitals:  Vitals:   04/09/17 1049 04/09/17 1059  BP: (!) 151/94 139/69  Pulse: 82 75  Resp: 16 16  Temp: 37 C   SpO2: 98% 98%    Last Pain:  Vitals:   04/09/17 1059  TempSrc:   PainSc: 0-No pain                 Elisa Kutner K

## 2017-04-09 NOTE — Anesthesia Post-op Follow-up Note (Signed)
Anesthesia QCDR form completed.        

## 2017-04-09 NOTE — Progress Notes (Signed)
[facility] MD Progress Note  04/09/2017 6:35 PM Michael Schmitt  MRN:  [id number] Subjective: Follow-up for 47 year old man with chronic depression.  Patient had ECT today which once again was uneventful and produced what should be a potentially effective seizure.  I was called later in the day that the patient was once again screaming and yelling banging his head with his hands acting out.  Patient continues to report depressed mood and suicidal ideation. Principal Problem: Severe recurrent major depression without psychotic features ([place]) Diagnosis:   Patient Active Problem List   Diagnosis Date Noted  . Severe recurrent major depression without psychotic features ([place]) [F33.2] 03/12/2017    Priority: High  . Generalized anxiety disorder [F41.1] 02/05/2017    Priority: High  . Chronic Suicidal ideation [R45.851] 09/15/2016    Priority: High  . Chronic pain syndrome [G89.4] 11/21/2016    Priority: Medium  . Uncontrolled type 2 diabetes mellitus without complication, without long-term current use of insulin ([place]) [E11.65] 10/19/2016    Priority: Medium  . Coronary artery disease [I25.10] 09/12/2016    Priority: Medium  . Combined hyperlipidemia [E78.2] 01/05/2016    Priority: Low  . Chronic neck pain (Primary Area of Pain) (Left) [M54.2, G89.29] 11/22/2016  . Cervical facet syndrome (Left) [Z12.458] 11/22/2016  . Cervical radiculitis (Left) [M54.12] 11/22/2016  . History of cervical spinal surgery [Z98.890] 11/22/2016  . Cervical spondylosis [M47.22] 11/22/2016  . Chronic shoulder pain (Secondary Area of Pain) (Left) [M25.512, G89.29] 11/22/2016  . 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 Chronic low back pain [facility] Area of Pain) (Bilateral) (L>R) [M54.5, G89.29] 11/22/2016  . Lumbar facet syndrome (Bilateral) (L>R) [M47.816] 11/22/2016  . Long term prescription benzodiazepine use [Z79.899] 11/21/2016  . Opiate use [F11.90] 11/21/2016  . HTN (hypertension) [I10]  06/26/2016  . GERD (gastroesophageal reflux disease) [K21.9] 06/26/2016  . Alcohol use disorder, severe, 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[place]) [F10.21] 06/26/2016  . Moderate benzodiazepine use disorder ([place]) [F13.20] 06/11/2016  . Sedative, hypnotic or anxiolytic use disorder, severe, dependence ([place]) [F13.20] 05/12/2016  . Ventricular fibrillation ([place]) [I49.01] 04/05/2016  . Cervical spondylosis without myelopathy [M47.812] 07/24/2014  . Severe episode of recurrent major depressive disorder, without psychotic features ([place]) [F33.2] 04/15/2013  . Tobacco use disorder [F17.200] 10/20/2010  . Asthma [J45.909] 10/20/2010   Total Time spent with patient: 30 minutes  Past Psychiatric History: Long history of depression but this current acting out stuff is new.  Positive past history of suicide attempts  Past Medical History:  Past Medical History:  Diagnosis Date  . Anxiety   . Anxiety   . Arthritis    cerv. stenosis, spondylosis, HNP- lower back , has been followed in pain clinic, has  had injection s in cerv. area  . Blood dyscrasia    told that when he was young he was a" free bleeder"  . CAD (coronary artery disease)   . Cervical spondylosis without myelopathy 07/24/2014  . Cervicogenic headache 07/24/2014  . Chronic kidney disease    renal calculi- passed spontaneously  . Depression   . Diabetes mellitus without complication ([place])   . Fatty liver   . GERD (gastroesophageal reflux disease)   . Headache(784.0)   . Hyperlipidemia   . Hypertension   . Mental disorder   . MI, old   . RLS (restless legs syndrome)    detected on sleep study  . Shortness of breath   . Ventricular fibrillation ([place]) 04/05/2016    Past Surgical History:  Procedure Laterality Date  .  ANTERIOR CERVICAL DECOMP/DISCECTOMY FUSION  11/13/2011   Procedure: ANTERIOR CERVICAL DECOMPRESSION/DISCECTOMY FUSION 1 LEVEL;  Surgeon: [name], MD;  Location: [place] [facility];  Service: Neurosurgery;  Laterality: N/A;  Anterior  Cervical Decompression/discectomy Fusion. Cervical three-four.  Marland Kitchen CARDIAC CATHETERIZATION N/A 01/01/2015   Procedure: Left Heart Cath and Coronary Angiography;  Surgeon: [name], MD;  Location: [place] [facility];  Service: Cardiovascular;  Laterality: N/A;  . CARDIAC CATHETERIZATION N/A 04/05/2016   Procedure: Left Heart Cath and Coronary Angiography;  Surgeon: Belva Crome, MD;  Location: [place] [facility];  Service: Cardiovascular;  Laterality: N/A;  . CARDIAC CATHETERIZATION N/A 04/05/2016   Procedure: Coronary Stent Intervention;  Surgeon: Belva Crome, MD;  Location: [place] [facility];  Service: Cardiovascular;  Laterality: N/A;  . CARDIAC CATHETERIZATION N/A 04/05/2016   Procedure: Intravascular Ultrasound/IVUS;  Surgeon: Belva Crome, MD;  Location: [place] [facility];  Service: Cardiovascular;  Laterality: N/A;  . NASAL SINUS SURGERY     2005   Family History:  Family History  Problem Relation Age of Onset  . Prostate cancer Father   . Hypertension Mother   . Kidney Stones Mother   . Anxiety disorder Mother   . Depression Mother   . COPD Sister   . Hypertension Sister   . Diabetes Sister   . Depression Sister   . Anxiety disorder Sister   . Seizures Sister   . ADD / ADHD Son   . ADD / ADHD Daughter    Family Psychiatric  History: Anxiety Social History:  Social History   Substance and Sexual Activity  Alcohol Use No  . Alcohol/week: 0.0 oz   Comment: last drinked in 3 months.      Social History   Substance and Sexual Activity  Drug Use No    Social History   Socioeconomic History  . Marital status: Widowed    Spouse name: None  . Number of children: 3  . Years of education: 26  . Highest education level: None  Social Needs  . Financial resource strain: None  . Food insecurity - worry: None  . Food insecurity - inability: None  . Transportation needs - medical: None  . Transportation needs - non-medical: None  Occupational History    Comment:  unemployed  Tobacco Use  . Smoking status: Current Every Day Smoker    Packs/day: 2.00    Years: 17.00    Pack years: 34.00    Types: Cigarettes, Cigars  . Smokeless tobacco: Former Systems developer  . Tobacco comment: occassional snuff   Substance and Sexual Activity  . Alcohol use: No    Alcohol/week: 0.0 oz    Comment: last drinked in 3 months.   . Drug use: No  . Sexual activity: Not Currently  Other Topics Concern  . None  Social History Narrative   Patient is right handed.   Patient drinks 2 sodas daily.   Additional Social History:                         Sleep: Fair  Appetite:  Fair  Current Medications: Current Facility-Administered Medications  Medication Dose Route Frequency Provider Last Rate Last Dose  . acetaminophen (TYLENOL) tablet 650 mg  650 mg Oral Q6H PRN Clapacs, Madie Reno, MD   650 mg at 04/09/17 1612  . alum & mag hydroxide-simeth (MAALOX/MYLANTA) 200-200-20 MG/5ML suspension 30 mL  30 mL Oral Q4H PRN Clapacs, Madie Reno, MD      .  atorvastatin (LIPITOR) tablet 80 mg  80 mg Oral q1800 Clapacs, Madie Reno, MD   80 mg at 04/09/17 1828  . carvedilol (COREG) tablet 6.25 mg  6.25 mg Oral BID WC Clapacs, John T, MD   6.25 mg at 04/09/17 1612  . clopidogrel (PLAVIX) tablet 75 mg  75 mg Oral Daily Clapacs, Madie Reno, MD   75 mg at 04/09/17 1125  . dextrose 5 %-0.45 % sodium chloride infusion   Intravenous Continuous Clapacs, John T, MD      . escitalopram (LEXAPRO) tablet 20 mg  20 mg Oral Daily Clapacs, John T, MD   20 mg at 04/09/17 1124  . fentaNYL (SUBLIMAZE) injection 25 mcg  25 mcg Intravenous Q5 min PRN Gunnar Bulla, MD      . haloperidol lactate (HALDOL) injection 5 mg  5 mg Intravenous Once Clapacs, John T, MD      . haloperidol lactate (HALDOL) injection 5 mg  5 mg Intravenous Once Clapacs, John T, MD      . haloperidol lactate (HALDOL) injection 5 mg  5 mg Intravenous Once Clapacs, John T, MD      . haloperidol lactate (HALDOL) injection 5 mg  5 mg Intravenous  Once Clapacs, John T, MD      . hydrOXYzine (ATARAX/VISTARIL) tablet 50 mg  50 mg Oral TID PRN Clapacs, Madie Reno, MD   50 mg at 04/09/17 1330  . ketorolac (TORADOL) 30 MG/ML injection 30 mg  30 mg Intravenous Once Clapacs, John T, MD      . magnesium hydroxide (MILK OF MAGNESIA) suspension 30 mL  30 mL Oral Daily PRN Clapacs, John T, MD      . metFORMIN (GLUCOPHAGE) tablet 500 mg  500 mg Oral BID WC Clapacs, Madie Reno, MD   500 mg at 04/09/17 1612  . midazolam (VERSED) injection 4 mg  4 mg Intravenous Once Clapacs, John T, MD      . midazolam (VERSED) injection 4 mg  4 mg Intravenous Once Clapacs, John T, MD      . midazolam (VERSED) injection 4 mg  4 mg Intravenous Once Clapacs, John T, MD      . mirtazapine (REMERON) tablet 30 mg  30 mg Oral QHS Clapacs, Madie Reno, MD   30 mg at 04/08/17 2117  . ondansetron (ZOFRAN) injection 4 mg  4 mg Intravenous Once PRN Gunnar Bulla, MD      . ondansetron [facility]) injection 4 mg  4 mg Intravenous Once PRN Gunnar Fusi, MD      . prazosin (MINIPRESS) capsule 2 mg  2 mg Oral QHS Clapacs, Madie Reno, MD   2 mg at 04/08/17 2117  . QUEtiapine (SEROQUEL) tablet 100 mg  100 mg Oral QHS Clapacs, John T, MD   100 mg at 04/08/17 2117  . traZODone (DESYREL) tablet 100 mg  100 mg Oral QHS PRN Clapacs, Madie Reno, MD   100 mg at 04/08/17 2117    Lab Results:  Results for orders placed or performed during the hospital encounter of 04/04/17 (from the past 48 hour(s))  Glucose, capillary     Status: Abnormal   Collection Time: 04/09/17  6:47 AM  Result Value Ref Range   Glucose-Capillary 144 (H) 65 - 99 mg/dL    Blood Alcohol level:  Lab Results  Component Value Date   ETH 37 (H) 03/12/2017   ETH <10 63/81/7711    Metabolic Disorder Labs: Lab Results  Component Value Date   HGBA1C 6.0 (  H) 03/13/2017   MPG 126 03/13/2017   MPG 177 09/12/2016   Lab Results  Component Value Date   PROLACTIN 18.1 (H) 09/12/2016   PROLACTIN 6.5 05/27/2016   Lab Results   Component Value Date   CHOL 118 03/13/2017   TRIG 325 (H) 03/13/2017   HDL 31 (L) 03/13/2017   CHOLHDL 3.8 03/13/2017   VLDL 65 (H) 03/13/2017   LDLCALC 22 03/13/2017   LDLCALC 37 09/12/2016    Physical Findings: AIMS: Facial and Oral Movements Muscles of Facial Expression: None, normal Lips and Perioral Area: None, normal Jaw: None, normal Tongue: None, normal,Extremity Movements Upper (arms, wrists, hands, fingers): None, normal Lower (legs, knees, ankles, toes): None, normal, Trunk Movements Neck, shoulders, hips: None, normal, Overall Severity Severity of abnormal movements (highest score from questions above): None, normal Incapacitation due to abnormal movements: None, normal Patient's awareness of abnormal movements (rate only patient's report): No Awareness, Dental Status Current problems with teeth and/or dentures?: No Does patient usually wear dentures?: No  CIWA:    COWS:     Musculoskeletal: Strength & Muscle Tone: within normal limits Gait & Station: normal Patient leans: N/A  Psychiatric Specialty Exam: Physical Exam  Nursing note and vitals reviewed. Constitutional: He appears well-developed and well-nourished.  HENT:  Head: Normocephalic and atraumatic.  Eyes: Conjunctivae are normal. Pupils are equal, round, and reactive to light.  Neck: Normal range of motion.  Cardiovascular: Normal heart sounds.  Respiratory: Effort normal.  GI: Soft.  Musculoskeletal: Normal range of motion.  Neurological: He is alert.  Skin: Skin is warm and dry.  Psychiatric: Judgment normal. His affect is blunt. His speech is delayed. He is slowed and withdrawn. Cognition and memory are impaired. He expresses suicidal ideation.    Review of Systems  Constitutional: Negative.   HENT: Negative.   Eyes: Negative.   Respiratory: Negative.   Cardiovascular: Negative.   Gastrointestinal: Negative.   Musculoskeletal: Negative.   Skin: Negative.   Neurological: Negative.    Psychiatric/Behavioral: Positive for depression and suicidal ideas. Negative for hallucinations, memory loss and substance abuse. The patient is nervous/anxious. The patient does not have insomnia.     Blood pressure 139/69, pulse 75, temperature 98.6 F (37 C), resp. rate 16, height 6\' 2"  (1.88 m), weight 90.7 kg (200 lb), SpO2 98 %.Body mass index is 25.68 kg/m.  General Appearance: Fairly Groomed  Eye Contact:  Fair  Speech:  Slow  Volume:  Decreased  Mood:  Anxious, Depressed and Dysphoric  Affect:  Inappropriate  Thought Process:  Goal Directed  Orientation:  Full (Time, Place, and Person)  Thought Content:  Logical and Rumination  Suicidal Thoughts:  Yes.  with intent/plan  Homicidal Thoughts:  No  Memory:  Immediate;   Fair Recent;   Fair Remote;   Fair  Judgement:  Impaired  Insight:  Shallow  Psychomotor Activity:  Decreased  Concentration:  Concentration: Poor  Recall:  Poor  Fund of Knowledge:  Fair  Language:  Fair  Akathisia:  No  Handed:  Right  AIMS (if indicated):     Assets:  Desire for Improvement Social Support  ADL's:  Intact  Cognition:  WNL  Sleep:  Number of Hours: 8     Treatment Plan Summary: Daily contact with patient to assess and evaluate symptoms and progress in treatment, Medication management and Plan This is a 47 year old man with chronic depression and anxiety now getting bilateral ECT treatment.  Today was treatment #4.  Patient does not seem  to be showing improvement yet although he is also very anxious about the potential for improvement.  No change to medicine for today.  Continue to try to get him to engage in groups.  Vital signs stable no new medical problems.  Alethia Berthold, MD 04/09/2017, 6:35 PM

## 2017-04-09 NOTE — [facility] Group Notes (Signed)
04/09/2017  Time: 0930   Type of Therapy and Topic:  Group Therapy:  Overcoming Obstacles   Participation Level:  Did Not Attend   Description of Group:   In this group patients will be encouraged to explore what they see as obstacles to their own wellness and recovery. They will be guided to discuss their thoughts, feelings, and behaviors related to these obstacles. The group will process together ways to cope with barriers, with attention given to specific choices patients can make. Each patient will be challenged to identify changes they are motivated to make in order to overcome their obstacles. This group will be process-oriented, with patients participating in exploration of their own experiences, giving and receiving support, and processing challenge from other group members.   Therapeutic Goals: 1. Patient will identify personal and current obstacles as they relate to admission. 2. Patient will identify barriers that currently interfere with their wellness or overcoming obstacles.  3. Patient will identify feelings, thought process and behaviors related to these barriers. 4. Patient will identify two changes they are willing to make to overcome these obstacles:      Summary of Patient Progress  Pt was invited to attend group but chose not to attend. CSW will continue to encourage pt to attend group throughout their admission.     Therapeutic Modalities:   Cognitive Behavioral Therapy Solution Focused Therapy Motivational Interviewing Relapse Prevention Therapy  Alden Hipp, MSW, LCSW 04/09/2017 10:36 AM

## 2017-04-10 LAB — GLUCOSE, CAPILLARY: Glucose-Capillary: 185 mg/dL — ABNORMAL HIGH (ref 65–99)

## 2017-04-10 MED ORDER — QUETIAPINE FUMARATE 100 MG PO TABS
150.0000 mg | ORAL_TABLET | Freq: Every day | ORAL | Status: DC
  Administered 2017-04-10: 150 mg via ORAL
  Filled 2017-04-10: qty 2

## 2017-04-10 NOTE — Progress Notes (Signed)
Recreation Therapy Notes  Date: 01.15.2019  Time: 1:00pm   Location: Craft Room  Behavioral response: Appropriate  Intervention Topic: Coping skills  Discussion/Intervention: Group content on today was focused on coping skills. The group defined what coping skills are and when they can be used. Individuals described how they normally cope with thing and the coping skills they normally use. Patients expressed why it is important to cope with things and how not coping with things can affect you. The group participated in the intervention "My coping box" and made coping boxes while adding coping skills they could use in the future to the box. Clinical Observations/Feedback:  Patient came to group and identified reading as a coping skill he uses.He was focused on what his peers and staff had to say about coping skills. Individual participated in the intervention and was social with peers and staff during group.  Unita Detamore LRT/CTRS         Renn Dirocco 04/10/2017 2:58 PM

## 2017-04-10 NOTE — [facility] Group Notes (Signed)
04/10/2017 9:30AM  Type of Therapy/Topic:  Group Therapy:  Feelings about Diagnosis  Participation Level:  None   Description of Group:   This group will allow patients to explore their thoughts and feelings about diagnoses they have received. Patients will be guided to explore their level of understanding and acceptance of these diagnoses. Facilitator will encourage patients to process their thoughts and feelings about the reactions of others to their diagnosis and will guide patients in identifying ways to discuss their diagnosis with significant others in their lives. This group will be process-oriented, with patients participating in exploration of their own experiences, giving and receiving support, and processing challenge from other group members.   Therapeutic Goals: 1. Patient will demonstrate understanding of diagnosis as evidenced by identifying two or more symptoms of the disorder 2. Patient will be able to express two feelings regarding the diagnosis 3. Patient will demonstrate their ability to communicate their needs through discussion and/or role play  Summary of Patient Progress: Michael Schmitt stayed the entire time. He was unable to discuss his feelings related to his diagnosis. He did listen to other people as they spoke around the topic.       Therapeutic Modalities:   Cognitive Behavioral Therapy Brief Therapy Feelings Identification    Darin Engels, LCSW 04/10/2017 10:31 AM

## 2017-04-10 NOTE — Plan of Care (Signed)
Patient shows progress in all areas today.

## 2017-04-10 NOTE — Progress Notes (Signed)
Patient is exhibiting depression symptoms without manic episode, states that ECT is not working and don't known what will work for me patient said. Denies any plan to hurt him self or others ,support and encouragement is rendered and  Acknowledged, 15 minute safety checks is in place and regularly at this time, compliance with medicines and attends groups with minimal participation, patient is safe in the unit. No distress noted.

## 2017-04-10 NOTE — Progress Notes (Signed)
Patient is out in the day room interacting appropriately with peers most of the day.No screaming noted.Patient asked staff "Do you think ECT is going to help me? I want to get better and see my kids."Encouragement and support given.Patient is receptive with staff.Compliant with medications.Attended groups.Appetite and energy level good.

## 2017-04-10 NOTE — Progress Notes (Signed)
[employer] MD Progress Note  04/10/2017 7:10 PM Michael Schmitt  MRN:  [id number] Subjective: "I think may be him better" patient seen.  Spoke also with nursing staff.  47 year old man with severe chronic depression in the hospital receiving ECT.  Patient thinks he might be slightly better today and the nursing staff agree.  This morning he still had one episode of screaming and crying and pounding himself on the head but apparently he was able to be settled down and redirected with some assistance from the nurse with less difficulty than previously.  He has been going to groups more consistently.  Still has suicidal thoughts but he is not focused on them.  Instead he seemed more focused on trying to improve himself for the benefit of his children.  Still tolerating ECT and medicine without difficulty Principal Problem: Severe recurrent major depression without psychotic features ([place]) Diagnosis:   Patient Active Problem List   Diagnosis Date Noted  . Severe recurrent major depression without psychotic features ([place]) [F33.2] 03/12/2017    Priority: High  . Generalized anxiety disorder [F41.1] 02/05/2017    Priority: High  . Chronic Suicidal ideation [R45.851] 09/15/2016    Priority: High  . Chronic pain syndrome [G89.4] 11/21/2016    Priority: Medium  . Uncontrolled type 2 diabetes mellitus without complication, without long-term current use of insulin ([place]) [E11.65] 10/19/2016    Priority: Medium  . Coronary artery disease [I25.10] 09/12/2016    Priority: Medium  . Combined hyperlipidemia [E78.2] 01/05/2016    Priority: Low  . Chronic neck pain (Primary Area of Pain) (Left) [M54.2, G89.29] 11/22/2016  . Cervical facet syndrome (Left) [F81.017] 11/22/2016  . Cervical radiculitis (Left) [M54.12] 11/22/2016  . History of cervical spinal surgery [Z98.890] 11/22/2016  . Cervical spondylosis [M47.22] 11/22/2016  . Chronic shoulder pain (Secondary Area of Pain) (Left) [M25.512, G89.29] 11/22/2016   . 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 Chronic low back pain [facility] Area of Pain) (Bilateral) (L>R) [M54.5, G89.29] 11/22/2016  . Lumbar facet syndrome (Bilateral) (L>R) [M47.816] 11/22/2016  . Long term prescription benzodiazepine use [Z79.899] 11/21/2016  . Opiate use [F11.90] 11/21/2016  . HTN (hypertension) [I10] 06/26/2016  . GERD (gastroesophageal reflux disease) [K21.9] 06/26/2016  . Alcohol use disorder, severe, 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[place]) [F10.21] 06/26/2016  . Moderate benzodiazepine use disorder ([place]) [F13.20] 06/11/2016  . Sedative, hypnotic or anxiolytic use disorder, severe, dependence ([place]) [F13.20] 05/12/2016  . Ventricular fibrillation ([place]) [I49.01] 04/05/2016  . Cervical spondylosis without myelopathy [M47.812] 07/24/2014  . Severe episode of recurrent major depressive disorder, without psychotic features ([place]) [F33.2] 04/15/2013  . Tobacco use disorder [F17.200] 10/20/2010  . Asthma [J45.909] 10/20/2010   Total Time spent with patient: 30 minutes  Past Psychiatric History: Long history of depression and anxiety.  Multiple admissions multiple suicide attempts.  Past Medical History:  Past Medical History:  Diagnosis Date  . Anxiety   . Anxiety   . Arthritis    cerv. stenosis, spondylosis, HNP- lower back , has been followed in pain clinic, has  had injection s in cerv. area  . Blood dyscrasia    told that when he was young he was a" free bleeder"  . CAD (coronary artery disease)   . Cervical spondylosis without myelopathy 07/24/2014  . Cervicogenic headache 07/24/2014  . Chronic kidney disease    renal calculi- passed spontaneously  . Depression   . Diabetes mellitus without complication ([place])   . Fatty liver   . GERD (gastroesophageal reflux disease)   .  Headache(784.0)   . Hyperlipidemia   . Hypertension   . Mental disorder   . MI, old   . RLS (restless legs syndrome)    detected on sleep study  . Shortness of breath   . Ventricular  fibrillation ([place]) 04/05/2016    Past Surgical History:  Procedure Laterality Date  . ANTERIOR CERVICAL DECOMP/DISCECTOMY FUSION  11/13/2011   Procedure: ANTERIOR CERVICAL DECOMPRESSION/DISCECTOMY FUSION 1 LEVEL;  Surgeon: [name], MD;  Location: [facility];  Service: Neurosurgery;  Laterality: N/A;  Anterior Cervical Decompression/discectomy Fusion. Cervical three-four.  Marland Kitchen CARDIAC CATHETERIZATION N/A 01/01/2015   Procedure: Left Heart Cath and Coronary Angiography;  Surgeon: [name], MD;  Location: [facility];  Service: Cardiovascular;  Laterality: N/A;  . CARDIAC CATHETERIZATION N/A 04/05/2016   Procedure: Left Heart Cath and Coronary Angiography;  Surgeon: Belva Crome, MD;  Location: [facility];  Service: Cardiovascular;  Laterality: N/A;  . CARDIAC CATHETERIZATION N/A 04/05/2016   Procedure: Coronary Stent Intervention;  Surgeon: Belva Crome, MD;  Location: [facility];  Service: Cardiovascular;  Laterality: N/A;  . CARDIAC CATHETERIZATION N/A 04/05/2016   Procedure: Intravascular Ultrasound/IVUS;  Surgeon: Belva Crome, MD;  Location: [facility];  Service: Cardiovascular;  Laterality: N/A;  . NASAL SINUS SURGERY     2005   Family History:  Family History  Problem Relation Age of Onset  . Prostate cancer Father   . Hypertension Mother   . Kidney Stones Mother   . Anxiety disorder Mother   . Depression Mother   . COPD Sister   . Hypertension Sister   . Diabetes Sister   . Depression Sister   . Anxiety disorder Sister   . Seizures Sister   . ADD / ADHD Son   . ADD / ADHD Daughter    Family Psychiatric  History: Positive for depression Social History:  Social History   Substance and Sexual Activity  Alcohol Use No  . Alcohol/week: 0.0 oz   Comment: last drinked in 3 months.      Social History   Substance and Sexual Activity  Drug Use No    Social History   Socioeconomic History  . Marital status: Widowed    Spouse name:  None  . Number of children: 3  . Years of education: 10  . Highest education level: None  Social Needs  . Financial resource strain: None  . Food insecurity - worry: None  . Food insecurity - inability: None  . Transportation needs - medical: None  . Transportation needs - non-medical: None  Occupational History    Comment: unemployed  Tobacco Use  . Smoking status: Current Every Day Smoker    Packs/day: 2.00    Years: 17.00    Pack years: 34.00    Types: Cigarettes, Cigars  . Smokeless tobacco: Former Systems developer  . Tobacco comment: occassional snuff   Substance and Sexual Activity  . Alcohol use: No    Alcohol/week: 0.0 oz    Comment: last drinked in 3 months.   . Drug use: No  . Sexual activity: Not Currently  Other Topics Concern  . None  Social History Narrative   Patient is right handed.   Patient drinks 2 sodas daily.   Additional Social History:                         Sleep: Fair  Appetite:  Fair  Current Medications:  Current Facility-Administered Medications  Medication Dose Route Frequency Provider Last Rate Last Dose  . acetaminophen (TYLENOL) tablet 650 mg  650 mg Oral Q6H PRN Clapacs, Madie Reno, MD   650 mg at 04/09/17 1612  . alum & mag hydroxide-simeth (MAALOX/MYLANTA) 200-200-20 MG/5ML suspension 30 mL  30 mL Oral Q4H PRN Clapacs, John T, MD      . atorvastatin (LIPITOR) tablet 80 mg  80 mg Oral q1800 Clapacs, John T, MD   80 mg at 04/10/17 1710  . carvedilol (COREG) tablet 6.25 mg  6.25 mg Oral BID WC Clapacs, John T, MD   6.25 mg at 04/10/17 1710  . clopidogrel (PLAVIX) tablet 75 mg  75 mg Oral Daily Clapacs, Madie Reno, MD   75 mg at 04/10/17 0806  . dextrose 5 %-0.45 % sodium chloride infusion   Intravenous Continuous Clapacs, John T, MD      . escitalopram (LEXAPRO) tablet 20 mg  20 mg Oral Daily Clapacs, Madie Reno, MD   20 mg at 04/10/17 2355  . haloperidol lactate (HALDOL) injection 5 mg  5 mg Intravenous Once Clapacs, John T, MD      . haloperidol  lactate (HALDOL) injection 5 mg  5 mg Intravenous Once Clapacs, John T, MD      . haloperidol lactate (HALDOL) injection 5 mg  5 mg Intravenous Once Clapacs, John T, MD      . haloperidol lactate (HALDOL) injection 5 mg  5 mg Intravenous Once Clapacs, John T, MD      . hydrOXYzine (ATARAX/VISTARIL) tablet 50 mg  50 mg Oral TID PRN Clapacs, Madie Reno, MD   50 mg at 04/09/17 1330  . ketorolac (TORADOL) 30 MG/ML injection 30 mg  30 mg Intravenous Once Clapacs, John T, MD      . magnesium hydroxide (MILK OF MAGNESIA) suspension 30 mL  30 mL Oral Daily PRN Clapacs, John T, MD      . metFORMIN (GLUCOPHAGE) tablet 500 mg  500 mg Oral BID WC Clapacs, John T, MD   500 mg at 04/10/17 1710  . midazolam (VERSED) injection 4 mg  4 mg Intravenous Once Clapacs, John T, MD      . midazolam (VERSED) injection 4 mg  4 mg Intravenous Once Clapacs, John T, MD      . midazolam (VERSED) injection 4 mg  4 mg Intravenous Once Clapacs, John T, MD      . mirtazapine (REMERON) tablet 30 mg  30 mg Oral QHS Clapacs, Madie Reno, MD   30 mg at 04/09/17 2105  . ondansetron (ZOFRAN) injection 4 mg  4 mg Intravenous Once PRN Gunnar Bulla, MD      . ondansetron [facility]) injection 4 mg  4 mg Intravenous Once PRN Gunnar Fusi, MD      . prazosin (MINIPRESS) capsule 2 mg  2 mg Oral QHS Clapacs, Madie Reno, MD   2 mg at 04/09/17 2105  . QUEtiapine (SEROQUEL) tablet 150 mg  150 mg Oral QHS Clapacs, John T, MD      . traZODone (DESYREL) tablet 100 mg  100 mg Oral QHS PRN Clapacs, Madie Reno, MD   100 mg at 04/09/17 2106    Lab Results:  Results for orders placed or performed during the hospital encounter of 04/04/17 (from the past 48 hour(s))  Glucose, capillary     Status: Abnormal   Collection Time: 04/09/17  6:47 AM  Result Value Ref Range   Glucose-Capillary 144 (H) 65 - 99  mg/dL  Glucose, capillary     Status: Abnormal   Collection Time: 04/10/17  7:21 AM  Result Value Ref Range   Glucose-Capillary 185 (H) 65 - 99 mg/dL   Comment  1 Notify RN     Blood Alcohol level:  Lab Results  Component Value Date   ETH 37 (H) 03/12/2017   ETH <10 68/34/1962    Metabolic Disorder Labs: Lab Results  Component Value Date   HGBA1C 6.0 (H) 03/13/2017   MPG 126 03/13/2017   MPG 177 09/12/2016   Lab Results  Component Value Date   PROLACTIN 18.1 (H) 09/12/2016   PROLACTIN 6.5 05/27/2016   Lab Results  Component Value Date   CHOL 118 03/13/2017   TRIG 325 (H) 03/13/2017   HDL 31 (L) 03/13/2017   CHOLHDL 3.8 03/13/2017   VLDL 65 (H) 03/13/2017   LDLCALC 22 03/13/2017   LDLCALC 37 09/12/2016    Physical Findings: AIMS: Facial and Oral Movements Muscles of Facial Expression: None, normal Lips and Perioral Area: None, normal Jaw: None, normal Tongue: None, normal,Extremity Movements Upper (arms, wrists, hands, fingers): None, normal Lower (legs, knees, ankles, toes): None, normal, Trunk Movements Neck, shoulders, hips: None, normal, Overall Severity Severity of abnormal movements (highest score from questions above): None, normal Incapacitation due to abnormal movements: None, normal Patient's awareness of abnormal movements (rate only patient's report): No Awareness, Dental Status Current problems with teeth and/or dentures?: No Does patient usually wear dentures?: No  CIWA:    COWS:     Musculoskeletal: Strength & Muscle Tone: within normal limits Gait & Station: normal Patient leans: N/A  Psychiatric Specialty Exam: Physical Exam  Nursing note and vitals reviewed. Constitutional: He appears well-developed and well-nourished.  HENT:  Head: Normocephalic and atraumatic.  Eyes: Conjunctivae are normal. Pupils are equal, round, and reactive to light.  Neck: Normal range of motion.  Cardiovascular: Regular rhythm and normal heart sounds.  Respiratory: Effort normal. No respiratory distress.  GI: Soft.  Musculoskeletal: Normal range of motion.  Neurological: He is alert.  Skin: Skin is warm and dry.   Psychiatric: His mood appears anxious. His speech is delayed. He is slowed. Cognition and memory are normal. He expresses impulsivity. He exhibits a depressed mood. He expresses suicidal ideation. He expresses no suicidal plans.    Review of Systems  Constitutional: Negative.   HENT: Negative.   Eyes: Negative.   Respiratory: Negative.   Cardiovascular: Negative.   Gastrointestinal: Negative.   Musculoskeletal: Negative.   Skin: Negative.   Neurological: Negative.   Psychiatric/Behavioral: Positive for depression, memory loss and suicidal ideas. Negative for hallucinations and substance abuse. The patient is nervous/anxious. The patient does not have insomnia.     Blood pressure 132/90, pulse 99, temperature 97.8 F (36.6 C), temperature source Oral, resp. rate 18, height 6\' 2"  (1.88 m), weight 90.7 kg (200 lb), SpO2 99 %.Body mass index is 25.68 kg/m.  General Appearance: Casual  Eye Contact:  Fair  Speech:  Slow  Volume:  Decreased  Mood:  Depressed and Dysphoric  Affect:  Congruent  Thought Process:  Goal Directed  Orientation:  Full (Time, Place, and Person)  Thought Content:  Logical  Suicidal Thoughts:  Yes.  without intent/plan  Homicidal Thoughts:  No  Memory:  Immediate;   Fair Recent;   Fair Remote;   Fair  Judgement:  Fair  Insight:  Fair  Psychomotor Activity:  Decreased  Concentration:  Concentration: Fair  Recall:  [employer] of Knowledge:  Fair  Language:  Fair  Akathisia:  No  Handed:  Right  AIMS (if indicated):     Assets:  Communication Skills Desire for Improvement Financial Resources/Insurance Housing Resilience Social Support  ADL's:  Intact  Cognition:  WNL  Sleep:  Number of Hours: 8     Treatment Plan Summary: Daily contact with patient to assess and evaluate symptoms and progress in treatment, Medication management and Plan Patient is starting to perhaps show a little bit of improvement.  He seems less panicky today.  Less thoroughly  hopeless.  Still having suicidal thoughts but not as persistently.  Tolerating medication.  I gave him a pep talk education lots of encouragement.  Next ECT treatment will be tomorrow morning.  I am also going to increase his Seroquel up to 150 mg at night for continued treatment of depression.  Continue to reassess for when he might be safe to go home although given his recent decompensation I do not think we are there yet.  Michael Berthold, MD 04/10/2017, 7:10 PM

## 2017-04-10 NOTE — Plan of Care (Signed)
  Progressing Activity: Sleeping patterns will improve 04/10/2017 1943 - Progressing by Clemens Catholic, RN Education: Emotional status will improve 04/10/2017 1943 - Progressing by Clemens Catholic, RN Mental status will improve 04/10/2017 1943 - Progressing by Clemens Catholic, RN Verbalization of understanding the information provided will improve 04/10/2017 1943 - Progressing by Clemens Catholic, RN Coping: Ability to verbalize frustrations and anger appropriately will improve 04/10/2017 1943 - Progressing by Clemens Catholic, RN Safety: Ability to disclose and discuss suicidal ideas will improve 04/10/2017 1943 - Progressing by Clemens Catholic, RN Self-Concept: Level of anxiety will decrease 04/10/2017 1943 - Progressing by Clemens Catholic, RN

## 2017-04-11 ENCOUNTER — Inpatient Hospital Stay: Payer: Medicare Other | Admitting: Anesthesiology

## 2017-04-11 ENCOUNTER — Other Ambulatory Visit: Payer: Self-pay | Admitting: Psychiatry

## 2017-04-11 LAB — GLUCOSE, CAPILLARY
GLUCOSE-CAPILLARY: 150 mg/dL — AB (ref 65–99)
Glucose-Capillary: 137 mg/dL — ABNORMAL HIGH (ref 65–99)

## 2017-04-11 MED ORDER — METHOHEXITAL SODIUM 100 MG/10ML IV SOSY
PREFILLED_SYRINGE | INTRAVENOUS | Status: DC | PRN
  Administered 2017-04-11: 100 mg via INTRAVENOUS

## 2017-04-11 MED ORDER — HALOPERIDOL LACTATE 5 MG/ML IJ SOLN
INTRAMUSCULAR | Status: DC | PRN
  Administered 2017-04-11: 5 mg via INTRAVENOUS

## 2017-04-11 MED ORDER — MIDAZOLAM HCL 2 MG/2ML IJ SOLN: 4.0000 mg | Freq: Once | INTRAMUSCULAR | Status: DC

## 2017-04-11 MED ORDER — MIDAZOLAM HCL 2 MG/2ML IJ SOLN
INTRAMUSCULAR | Status: AC
  Filled 2017-04-11: qty 4

## 2017-04-11 MED ORDER — QUETIAPINE FUMARATE 200 MG PO TABS
200.0000 mg | ORAL_TABLET | Freq: Every day | ORAL | Status: DC
  Administered 2017-04-11 – 2017-04-12 (×2): 200 mg via ORAL
  Filled 2017-04-11 (×2): qty 1

## 2017-04-11 MED ORDER — KETOROLAC TROMETHAMINE 30 MG/ML IJ SOLN
30.0000 mg | Freq: Once | INTRAMUSCULAR | Status: AC
  Administered 2017-04-11: 30 mg via INTRAVENOUS

## 2017-04-11 MED ORDER — HALOPERIDOL LACTATE 5 MG/ML IJ SOLN
5.0000 mg | Freq: Once | INTRAMUSCULAR | Status: DC
  Filled 2017-04-11: qty 1

## 2017-04-11 MED ORDER — SODIUM CHLORIDE 0.9 % IV SOLN
INTRAVENOUS | Status: DC | PRN
  Administered 2017-04-11: 09:00:00 via INTRAVENOUS

## 2017-04-11 MED ORDER — IBUPROFEN 600 MG PO TABS
600.0000 mg | ORAL_TABLET | Freq: Three times a day (TID) | ORAL | Status: DC | PRN
  Administered 2017-04-11 – 2017-04-16 (×4): 600 mg via ORAL
  Filled 2017-04-11 (×4): qty 1

## 2017-04-11 MED ORDER — METHOHEXITAL SODIUM 0.5 G IJ SOLR
INTRAMUSCULAR | Status: AC
  Filled 2017-04-11: qty 500

## 2017-04-11 MED ORDER — MIDAZOLAM HCL 2 MG/2ML IJ SOLN
INTRAMUSCULAR | Status: DC | PRN
  Administered 2017-04-11: 4 mg via INTRAVENOUS

## 2017-04-11 MED ORDER — SODIUM CHLORIDE 0.9 % IV SOLN
500.0000 mL | Freq: Once | INTRAVENOUS | Status: AC
  Administered 2017-04-11: 500 mL via INTRAVENOUS

## 2017-04-11 MED ORDER — SUCCINYLCHOLINE CHLORIDE 200 MG/10ML IV SOSY
PREFILLED_SYRINGE | INTRAVENOUS | Status: DC | PRN
  Administered 2017-04-11: 150 mg via INTRAVENOUS

## 2017-04-11 MED ORDER — KETOROLAC TROMETHAMINE 30 MG/ML IJ SOLN
INTRAMUSCULAR | Status: AC
  Filled 2017-04-11: qty 1

## 2017-04-11 MED ORDER — MIRTAZAPINE 15 MG PO TABS
45.0000 mg | ORAL_TABLET | Freq: Every day | ORAL | Status: DC
  Administered 2017-04-11 – 2017-04-15 (×5): 45 mg via ORAL
  Filled 2017-04-11 (×5): qty 3

## 2017-04-11 NOTE — Progress Notes (Signed)
Patient is calm and cooperating with care , participating in groups and assertive , contract for safety of self and others. Patient remain safe in the unit no distress denies any suicidal tendencies, sleeping long hours no distress.

## 2017-04-11 NOTE — Plan of Care (Signed)
  Education: Emotional status will improve 04/11/2017 1617 - Progressing by Rise Mu, RN Note Per self inventory patient rates his depression as a 10/10.  When this writer spoke with patient, patient rated depression as a 5/10.   Education: Verbalization of understanding the information provided will improve 04/11/2017 1617 - Progressing by Rise Mu, RN   Coping: Ability to verbalize frustrations and anger appropriately will improve 04/11/2017 1617 - Progressing by Rise Mu, RN Note Continues to have outburst at times but frequency decreased

## 2017-04-11 NOTE — Procedures (Signed)
ECT SERVICES Physician's Interval Evaluation & Treatment Note  Patient Identification: Michael Schmitt MRN:  [id number] Date of Evaluation:  04/11/2017 TX #: [id number]  MADRS:   MMSE:   P.E. Findings:  As no change.  Vitals normal heart and lungs normal  Psychiatric Interval Note:  Mood still depressed and flat  Subjective:  Patient is a 47 y.o. male seen for evaluation for Electroconvulsive Therapy. Plan depressed passive suicidal ideation less anxious less agitated  Treatment Summary:   []   Right Unilateral             []  Bilateral   % Energy : 1.0 ms 50%   Impedance: 1610 ohms  Seizure Energy Index: 11,059 V squared  Postictal Suppression Index: 88%  Seizure Concordance Index: 99%  Medications  Pre Shock: Toradol 30 mg Brevital 100 mg succinylcholine 150 mg  Post Shock: Versed 4 mg Haldol 5 mg  Seizure Duration: 35 seconds by EMG 52 seconds by EEG   Comments: Next treatment Friday  Lungs:  [x]   Clear to auscultation               []  Other:   Heart:    [x]   Regular rhythm             []  irregular rhythm    [x]   Previous H&P reviewed, patient examined and there are NO CHANGES                 []   Previous H&P reviewed, patient examined and there are changes noted.   Alethia Berthold, MD 1/16/201910:28 AM

## 2017-04-11 NOTE — Progress Notes (Signed)
[facility] MD Progress Note  04/11/2017 5:21 PM Michael Schmitt  MRN:  [id number] Subjective: Follow-up for this 47 year old man with severe depression in the hospital receiving ECT.  Patient is starting to acknowledge that he is not quite as desperately depressed as before.  For the last couple days he has not been crying out or striking himself on the head.  Still has some passive suicidal ideation but does not spontaneously talk about it.  Has been tolerating ECT without difficulty.  Going to more groups.  He has a headache this afternoon but otherwise no new complaints Principal Problem: Severe recurrent major depression without psychotic features ([place]) Diagnosis:   Patient Active Problem List   Diagnosis Date Noted  . Severe recurrent major depression without psychotic features ([place]) [F33.2] 03/12/2017    Priority: High  . Generalized anxiety disorder [F41.1] 02/05/2017    Priority: High  . Chronic Suicidal ideation [R45.851] 09/15/2016    Priority: High  . Chronic pain syndrome [G89.4] 11/21/2016    Priority: Medium  . Uncontrolled type 2 diabetes mellitus without complication, without long-term current use of insulin ([place]) [E11.65] 10/19/2016    Priority: Medium  . Coronary artery disease [I25.10] 09/12/2016    Priority: Medium  . Combined hyperlipidemia [E78.2] 01/05/2016    Priority: Low  . Chronic neck pain (Primary Area of Pain) (Left) [M54.2, G89.29] 11/22/2016  . Cervical facet syndrome (Left) [N82.956] 11/22/2016  . Cervical radiculitis (Left) [M54.12] 11/22/2016  . History of cervical spinal surgery [Z98.890] 11/22/2016  . Cervical spondylosis [M47.22] 11/22/2016  . Chronic shoulder pain (Secondary Area of Pain) (Left) [M25.512, G89.29] 11/22/2016  . 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 Chronic low back pain [facility] Area of Pain) (Bilateral) (L>R) [M54.5, G89.29] 11/22/2016  . Lumbar facet syndrome (Bilateral) (L>R) [M47.816] 11/22/2016  . Long term  prescription benzodiazepine use [Z79.899] 11/21/2016  . Opiate use [F11.90] 11/21/2016  . HTN (hypertension) [I10] 06/26/2016  . GERD (gastroesophageal reflux disease) [K21.9] 06/26/2016  . Alcohol use disorder, severe, 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[place]) [F10.21] 06/26/2016  . Moderate benzodiazepine use disorder ([place]) [F13.20] 06/11/2016  . Sedative, hypnotic or anxiolytic use disorder, severe, dependence ([place]) [F13.20] 05/12/2016  . Ventricular fibrillation ([place]) [I49.01] 04/05/2016  . Cervical spondylosis without myelopathy [M47.812] 07/24/2014  . Severe episode of recurrent major depressive disorder, without psychotic features ([place]) [F33.2] 04/15/2013  . Tobacco use disorder [F17.200] 10/20/2010  . Asthma [J45.909] 10/20/2010   Total Time spent with patient: 30 minutes  Past Psychiatric History: Long-standing depression and history of suicidality.  Past Medical History:  Past Medical History:  Diagnosis Date  . Anxiety   . Anxiety   . Arthritis    cerv. stenosis, spondylosis, HNP- lower back , has been followed in pain clinic, has  had injection s in cerv. area  . Blood dyscrasia    told that when he was young he was a" free bleeder"  . CAD (coronary artery disease)   . Cervical spondylosis without myelopathy 07/24/2014  . Cervicogenic headache 07/24/2014  . Chronic kidney disease    renal calculi- passed spontaneously  . Depression   . Diabetes mellitus without complication ([place])   . Fatty liver   . GERD (gastroesophageal reflux disease)   . Headache(784.0)   . Hyperlipidemia   . Hypertension   . Mental disorder   . MI, old   . RLS (restless legs syndrome)    detected on sleep study  . Shortness of breath   . Ventricular fibrillation ([place])  04/05/2016    Past Surgical History:  Procedure Laterality Date  . ANTERIOR CERVICAL DECOMP/DISCECTOMY FUSION  11/13/2011   Procedure: ANTERIOR CERVICAL DECOMPRESSION/DISCECTOMY FUSION 1 LEVEL;  Surgeon: [name], MD;  Location: [place] NEURO  ORS;  Service: Neurosurgery;  Laterality: N/A;  Anterior Cervical Decompression/discectomy Fusion. Cervical three-four.  Marland Kitchen CARDIAC CATHETERIZATION N/A 01/01/2015   Procedure: Left Heart Cath and Coronary Angiography;  Surgeon: [name], MD;  Location: [place] CV LAB;  Service: Cardiovascular;  Laterality: N/A;  . CARDIAC CATHETERIZATION N/A 04/05/2016   Procedure: Left Heart Cath and Coronary Angiography;  Surgeon: Belva Crome, MD;  Location: [place] CV LAB;  Service: Cardiovascular;  Laterality: N/A;  . CARDIAC CATHETERIZATION N/A 04/05/2016   Procedure: Coronary Stent Intervention;  Surgeon: Belva Crome, MD;  Location: [place] CV LAB;  Service: Cardiovascular;  Laterality: N/A;  . CARDIAC CATHETERIZATION N/A 04/05/2016   Procedure: Intravascular Ultrasound/IVUS;  Surgeon: Belva Crome, MD;  Location: [place] CV LAB;  Service: Cardiovascular;  Laterality: N/A;  . NASAL SINUS SURGERY     2005   Family History:  Family History  Problem Relation Age of Onset  . Prostate cancer Father   . Hypertension Mother   . Kidney Stones Mother   . Anxiety disorder Mother   . Depression Mother   . COPD Sister   . Hypertension Sister   . Diabetes Sister   . Depression Sister   . Anxiety disorder Sister   . Seizures Sister   . ADD / ADHD Son   . ADD / ADHD Daughter    Family Psychiatric  History: Positive for anxiety Social History:  Social History   Substance and Sexual Activity  Alcohol Use No  . Alcohol/week: 0.0 oz   Comment: last drinked in 3 months.      Social History   Substance and Sexual Activity  Drug Use No    Social History   Socioeconomic History  . Marital status: Widowed    Spouse name: None  . Number of children: 3  . Years of education: 20  . Highest education level: None  Social Needs  . Financial resource strain: None  . Food insecurity - worry: None  . Food insecurity - inability: None  . Transportation needs - medical: None  .  Transportation needs - non-medical: None  Occupational History    Comment: unemployed  Tobacco Use  . Smoking status: Current Every Day Smoker    Packs/day: 2.00    Years: 17.00    Pack years: 34.00    Types: Cigarettes, Cigars  . Smokeless tobacco: Former Systems developer  . Tobacco comment: occassional snuff   Substance and Sexual Activity  . Alcohol use: No    Alcohol/week: 0.0 oz    Comment: last drinked in 3 months.   . Drug use: No  . Sexual activity: Not Currently  Other Topics Concern  . None  Social History Narrative   Patient is right handed.   Patient drinks 2 sodas daily.   Additional Social History:                         Sleep: Fair  Appetite:  Fair  Current Medications: Current Facility-Administered Medications  Medication Dose Route Frequency Provider Last Rate Last Dose  . acetaminophen (TYLENOL) tablet 650 mg  650 mg Oral Q6H PRN Price Lachapelle, Madie Reno, MD   650 mg at 04/11/17 1207  . alum & mag hydroxide-simeth (MAALOX/MYLANTA)  200-200-20 MG/5ML suspension 30 mL  30 mL Oral Q4H PRN Lanecia Sliva,  T, MD      . atorvastatin (LIPITOR) tablet 80 mg  80 mg Oral q1800 Kendricks Reap,  T, MD   80 mg at 04/10/17 1710  . carvedilol (COREG) tablet 6.25 mg  6.25 mg Oral BID WC Dayane Hillenburg T, MD   6.25 mg at 04/11/17 1206  . clopidogrel (PLAVIX) tablet 75 mg  75 mg Oral Daily Imri Lor, Madie Reno, MD   75 mg at 04/11/17 1206  . dextrose 5 %-0.45 % sodium chloride infusion   Intravenous Continuous Tyshawn Keel T, MD      . escitalopram (LEXAPRO) tablet 20 mg  20 mg Oral Daily Larcenia Holaday T, MD   20 mg at 04/11/17 1206  . haloperidol lactate (HALDOL) injection 5 mg  5 mg Intravenous Once Oyindamola Key T, MD      . haloperidol lactate (HALDOL) injection 5 mg  5 mg Intravenous Once Lachrista Heslin T, MD      . haloperidol lactate (HALDOL) injection 5 mg  5 mg Intravenous Once Bleu Moisan T, MD      . haloperidol lactate (HALDOL) injection 5 mg  5 mg Intravenous Once Diego Ulbricht T,  MD      . haloperidol lactate (HALDOL) injection 5 mg  5 mg Intravenous Once Masey Scheiber T, MD      . hydrOXYzine (ATARAX/VISTARIL) tablet 50 mg  50 mg Oral TID PRN Ricardo Kayes, Madie Reno, MD   50 mg at 04/09/17 1330  . ibuprofen (ADVIL,MOTRIN) tablet 600 mg  600 mg Oral Q8H PRN Ewel Lona T, MD      . ketorolac (TORADOL) 30 MG/ML injection           . magnesium hydroxide (MILK OF MAGNESIA) suspension 30 mL  30 mL Oral Daily PRN Zaharah Amir T, MD      . metFORMIN (GLUCOPHAGE) tablet 500 mg  500 mg Oral BID WC Dionisios Ricci T, MD   500 mg at 04/11/17 1206  . midazolam (VERSED) injection 4 mg  4 mg Intravenous Once Shavy Beachem T, MD      . midazolam (VERSED) injection 4 mg  4 mg Intravenous Once Tiyonna Sardinha T, MD      . midazolam (VERSED) injection 4 mg  4 mg Intravenous Once Kyarra Vancamp T, MD      . midazolam (VERSED) injection 4 mg  4 mg Intravenous Once Krystalynn Ridgeway T, MD      . mirtazapine (REMERON) tablet 45 mg  45 mg Oral QHS Florean Hoobler T, MD      . ondansetron (ZOFRAN) injection 4 mg  4 mg Intravenous Once PRN Gunnar Bulla, MD      . ondansetron [facility]) injection 4 mg  4 mg Intravenous Once PRN Gunnar Fusi, MD      . prazosin (MINIPRESS) capsule 2 mg  2 mg Oral QHS , Madie Reno, MD   2 mg at 04/10/17 2120  . QUEtiapine (SEROQUEL) tablet 200 mg  200 mg Oral QHS Caci Orren T, MD      . traZODone (DESYREL) tablet 100 mg  100 mg Oral QHS PRN , Madie Reno, MD   100 mg at 04/10/17 2120    Lab Results:  Results for orders placed or performed during the hospital encounter of 04/04/17 (from the past 48 hour(s))  Glucose, capillary     Status: Abnormal   Collection Time: 04/10/17  7:21 AM  Result Value Ref  Range   Glucose-Capillary 185 (H) 65 - 99 mg/dL   Comment 1 Notify RN   Glucose, capillary     Status: Abnormal   Collection Time: 04/11/17  7:08 AM  Result Value Ref Range   Glucose-Capillary 150 (H) 65 - 99 mg/dL  Glucose, capillary     Status: Abnormal    Collection Time: 04/11/17 10:52 AM  Result Value Ref Range   Glucose-Capillary 137 (H) 65 - 99 mg/dL    Blood Alcohol level:  Lab Results  Component Value Date   ETH 37 (H) 03/12/2017   ETH <10 34/19/6222    Metabolic Disorder Labs: Lab Results  Component Value Date   HGBA1C 6.0 (H) 03/13/2017   MPG 126 03/13/2017   MPG 177 09/12/2016   Lab Results  Component Value Date   PROLACTIN 18.1 (H) 09/12/2016   PROLACTIN 6.5 05/27/2016   Lab Results  Component Value Date   CHOL 118 03/13/2017   TRIG 325 (H) 03/13/2017   HDL 31 (L) 03/13/2017   CHOLHDL 3.8 03/13/2017   VLDL 65 (H) 03/13/2017   LDLCALC 22 03/13/2017   LDLCALC 37 09/12/2016    Physical Findings: AIMS: Facial and Oral Movements Muscles of Facial Expression: None, normal Lips and Perioral Area: None, normal Jaw: None, normal Tongue: None, normal,Extremity Movements Upper (arms, wrists, hands, fingers): None, normal Lower (legs, knees, ankles, toes): None, normal, Trunk Movements Neck, shoulders, hips: None, normal, Overall Severity Severity of abnormal movements (highest score from questions above): None, normal Incapacitation due to abnormal movements: None, normal Patient's awareness of abnormal movements (rate only patient's report): No Awareness, Dental Status Current problems with teeth and/or dentures?: No Does patient usually wear dentures?: No  CIWA:    COWS:     Musculoskeletal: Strength & Muscle Tone: within normal limits Gait & Station: normal Patient leans: N/A  Psychiatric Specialty Exam: Physical Exam  Nursing note and vitals reviewed. Constitutional: He appears well-developed and well-nourished.  HENT:  Head: Normocephalic and atraumatic.  Eyes: Conjunctivae are normal. Pupils are equal, round, and reactive to light.  Neck: Normal range of motion.  Cardiovascular: Regular rhythm and normal heart sounds.  Respiratory: Effort normal. No respiratory distress.  GI: Soft.   Musculoskeletal: Normal range of motion.  Neurological: He is alert.  Skin: Skin is warm and dry.  Psychiatric: Judgment normal. His speech is delayed. He is slowed. Thought content is not paranoid. Cognition and memory are impaired. He exhibits a depressed mood.    Review of Systems  Constitutional: Negative.   HENT: Negative.   Eyes: Negative.   Respiratory: Negative.   Cardiovascular: Negative.   Gastrointestinal: Negative.   Musculoskeletal: Negative.   Skin: Negative.   Neurological: Negative.   Psychiatric/Behavioral: Positive for depression, memory loss and suicidal ideas. Negative for hallucinations and substance abuse. The patient is not nervous/anxious and does not have insomnia.     Blood pressure 126/75, pulse 78, temperature 98.1 F (36.7 C), resp. rate 12, height 6\' 2"  (1.88 m), weight 90.7 kg (200 lb), SpO2 96 %.Body mass index is 25.68 kg/m.  General Appearance: Casual  Eye Contact:  Fair  Speech:  Slow  Volume:  Decreased  Mood:  Dysphoric  Affect:  Congruent  Thought Process:  Goal Directed  Orientation:  Full (Time, Place, and Person)  Thought Content:  Logical  Suicidal Thoughts:  Yes.  without intent/plan  Homicidal Thoughts:  No  Memory:  Immediate;   Fair Recent;   Fair Remote;   Fair  Judgement:  Fair  Insight:  Fair  Psychomotor Activity:  Decreased  Concentration:  Concentration: Fair  Recall:  [employer] of Knowledge:  Fair  Language:  Fair  Akathisia:  No  Handed:  Right  AIMS (if indicated):     Assets:  Desire for Improvement Housing Resilience Social Support  ADL's:  Intact  Cognition:  Impaired,  Mild  Sleep:  Number of Hours: 7.15     Treatment Plan Summary: Daily contact with patient to assess and evaluate symptoms and progress in treatment, Medication management and Plan Patient seems to be starting to show some benefit from ECT which will be continued as scheduled.  I have also today increased his Seroquel up to 200 mg at  night and the mirtazapine to 45 mg at night.  Encourage patient to continue attending groups.  Blood sugars seem to be stable.  No other new physical problems.  I spoke with social work and treatment team today.  I am optimistic that perhaps we can look at discharge within the next 2-4 days  Alethia Berthold, MD 04/11/2017, 5:21 PM

## 2017-04-11 NOTE — Anesthesia Preprocedure Evaluation (Signed)
Anesthesia Evaluation  Patient identified by MRN, date of birth, ID band Patient awake    Reviewed: Allergy & Precautions, NPO status , Patient's Chart, lab work & pertinent test results  History of Anesthesia Complications Negative for: history of anesthetic complications  Airway Mallampati: II  TM Distance: >3 FB Neck ROM: Full    Dental no notable dental hx.    Pulmonary asthma , neg sleep apnea, Current Smoker,    breath sounds clear to auscultation- rhonchi (-) wheezing      Cardiovascular hypertension, (-) angina+ CAD, + Past MI and + Cardiac Stents   Rhythm:Regular Rate:Normal - Systolic murmurs and - Diastolic murmurs    Neuro/Psych  Headaches, PSYCHIATRIC DISORDERS Anxiety Depression    GI/Hepatic Neg liver ROS, GERD  ,  Endo/Other  diabetes, Oral Hypoglycemic Agents  Renal/GU negative Renal ROS     Musculoskeletal  (+) Arthritis ,   Abdominal (+) - obese,   Peds  Hematology negative hematology ROS (+)   Anesthesia Other Findings Past Medical History: No date: Anxiety No date: Anxiety No date: Arthritis     Comment:  cerv. stenosis, spondylosis, HNP- lower back , has been               followed in pain clinic, has  had injection s in cerv.               area No date: Blood dyscrasia     Comment:  told that when he was young he was a" free bleeder" No date: CAD (coronary artery disease) 07/24/2014: Cervical spondylosis without myelopathy 07/24/2014: Cervicogenic headache No date: Chronic kidney disease     Comment:  renal calculi- passed spontaneously No date: Depression No date: Diabetes mellitus without complication (HCC) No date: Fatty liver No date: GERD (gastroesophageal reflux disease) No date: Headache(784.0) No date: Hyperlipidemia No date: Hypertension No date: Mental disorder No date: MI, old No date: RLS (restless legs syndrome)     Comment:  detected on sleep study No date:  Shortness of breath 04/05/2016: Ventricular fibrillation ([place])   Reproductive/Obstetrics                             Anesthesia Physical Anesthesia Plan  ASA: III  Anesthesia Plan: General   Post-op Pain Management:    Induction: Intravenous  PONV Risk Score and Plan: 0  Airway Management Planned: Mask  Additional Equipment:   Intra-op Plan:   Post-operative Plan:   Informed Consent: I have reviewed the patients History and Physical, chart, labs and discussed the procedure including the risks, benefits and alternatives for the proposed anesthesia with the patient or authorized representative who has indicated his/her understanding and acceptance.   Dental advisory given  Plan Discussed with: CRNA and Anesthesiologist  Anesthesia Plan Comments:         Anesthesia Quick Evaluation

## 2017-04-11 NOTE — Anesthesia Procedure Notes (Signed)
Date/Time: 04/11/2017 10:34 AM Performed by: Dionne Bucy, CRNA Pre-anesthesia Checklist: Patient identified, Emergency Drugs available, Suction available and Patient being monitored Patient Re-evaluated:Patient Re-evaluated prior to induction Oxygen Delivery Method: Circle system utilized Preoxygenation: Pre-oxygenation with 100% oxygen Induction Type: IV induction Ventilation: Mask ventilation without difficulty and Mask ventilation throughout procedure Airway Equipment and Method: Bite block Placement Confirmation: positive ETCO2 Dental Injury: Teeth and Oropharynx as per pre-operative assessment

## 2017-04-11 NOTE — Plan of Care (Signed)
  Progressing Activity: Interest or engagement in activities will improve 04/11/2017 2020 - Progressing by Clemens Catholic, RN Education: Emotional status will improve 04/11/2017 2020 - Progressing by Clemens Catholic, RN Mental status will improve 04/11/2017 2020 - Progressing by Clemens Catholic, RN Verbalization of understanding the information provided will improve 04/11/2017 2020 - Progressing by Clemens Catholic, RN Coping: Ability to demonstrate self-control will improve 04/11/2017 2020 - Progressing by Clemens Catholic, RN Safety: Ability to disclose and discuss suicidal ideas will improve 04/11/2017 2020 - Progressing by Clemens Catholic, RN Self-Concept: Ability to verbalize positive feelings about self will improve 04/11/2017 2020 - Progressing by Clemens Catholic, RN

## 2017-04-11 NOTE — H&P (Signed)
Michael Schmitt is an 47 y.o. male.   Chief Complaint: Patient still depressed passive suicidal ideation.  Slightly less agitated however and a little more optimism HPI: History of severe recurrent depression currently worsening but in the midst of ECT  Past Medical History:  Diagnosis Date  . Anxiety   . Anxiety   . Arthritis    cerv. stenosis, spondylosis, HNP- lower back , has been followed in pain clinic, has  had injection s in cerv. area  . Blood dyscrasia    told that when he was young he was a" free bleeder"  . CAD (coronary artery disease)   . Cervical spondylosis without myelopathy 07/24/2014  . Cervicogenic headache 07/24/2014  . Chronic kidney disease    renal calculi- passed spontaneously  . Depression   . Diabetes mellitus without complication ([place])   . Fatty liver   . GERD (gastroesophageal reflux disease)   . Headache(784.0)   . Hyperlipidemia   . Hypertension   . Mental disorder   . MI, old   . RLS (restless legs syndrome)    detected on sleep study  . Shortness of breath   . Ventricular fibrillation ([place]) 04/05/2016    Past Surgical History:  Procedure Laterality Date  . ANTERIOR CERVICAL DECOMP/DISCECTOMY FUSION  11/13/2011   Procedure: ANTERIOR CERVICAL DECOMPRESSION/DISCECTOMY FUSION 1 LEVEL;  Surgeon: [name], MD;  Location: [facility];  Service: Neurosurgery;  Laterality: N/A;  Anterior Cervical Decompression/discectomy Fusion. Cervical three-four.  Marland Kitchen CARDIAC CATHETERIZATION N/A 01/01/2015   Procedure: Left Heart Cath and Coronary Angiography;  Surgeon: [name], MD;  Location: [facility];  Service: Cardiovascular;  Laterality: N/A;  . CARDIAC CATHETERIZATION N/A 04/05/2016   Procedure: Left Heart Cath and Coronary Angiography;  Surgeon: Belva Crome, MD;  Location: [facility];  Service: Cardiovascular;  Laterality: N/A;  . CARDIAC CATHETERIZATION N/A 04/05/2016   Procedure: Coronary Stent Intervention;  Surgeon: Belva Crome,  MD;  Location: [facility];  Service: Cardiovascular;  Laterality: N/A;  . CARDIAC CATHETERIZATION N/A 04/05/2016   Procedure: Intravascular Ultrasound/IVUS;  Surgeon: Belva Crome, MD;  Location: [facility];  Service: Cardiovascular;  Laterality: N/A;  . NASAL SINUS SURGERY     2005    Family History  Problem Relation Age of Onset  . Prostate cancer Father   . Hypertension Mother   . Kidney Stones Mother   . Anxiety disorder Mother   . Depression Mother   . COPD Sister   . Hypertension Sister   . Diabetes Sister   . Depression Sister   . Anxiety disorder Sister   . Seizures Sister   . ADD / ADHD Son   . ADD / ADHD Daughter    Social History:  reports that he has been smoking cigarettes and cigars.  He has a 34.00 pack-year smoking history. He has quit using smokeless tobacco. He reports that he does not drink alcohol or use drugs.  Allergies:  Allergies  Allergen Reactions  . Prednisone Other (See Comments)    Hypertension, makes him feel spacey  . Hydrocodone Other (See Comments)    Headache, irritable  . Varenicline Other (See Comments)    Suicidal thoughts    Medications Prior to Admission  Medication Sig Dispense Refill  . aspirin EC 81 MG EC tablet Take 1 tablet (81 mg total) by mouth daily. 30 tablet 1  . atorvastatin (LIPITOR) 80 MG tablet Take 1 tablet (80 mg  total) by mouth daily at 6 PM. 30 tablet 1  . carvedilol (COREG) 6.25 MG tablet Take 1 tablet (6.25 mg total) by mouth 2 (two) times daily with a meal. 60 tablet 1  . clopidogrel (PLAVIX) 75 MG tablet Take 1 tablet (75 mg total) by mouth daily. 30 tablet 1  . escitalopram (LEXAPRO) 20 MG tablet Take 1 tablet (20 mg total) by mouth daily. 30 tablet 1  . hydrOXYzine (ATARAX/VISTARIL) 50 MG tablet Take 1 tablet (50 mg total) by mouth 3 (three) times daily as needed for anxiety. 90 tablet 1  . lurasidone 60 MG TABS Take 1 tablet (60 mg total) by mouth daily with breakfast. 30 tablet 1  . metFORMIN  (GLUCOPHAGE) 500 MG tablet Take 1 tablet (500 mg total) by mouth 2 (two) times daily with a meal. 60 tablet 1  . mirtazapine (REMERON) 15 MG tablet Take 1 tablet (15 mg total) by mouth at bedtime. 30 tablet 0  . nitroGLYCERIN (NITROSTAT) 0.4 MG SL tablet Place 1 tablet (0.4 mg total) under the tongue every 5 (five) minutes x 3 doses as needed. 10 tablet 0  . prazosin (MINIPRESS) 2 MG capsule Take 1 capsule (2 mg total) by mouth at bedtime. 30 capsule 1  . traZODone (DESYREL) 100 MG tablet Take 1 tablet (100 mg total) by mouth at bedtime as needed for sleep. 30 tablet 1  . zolpidem (AMBIEN) 10 MG tablet Take 1 tablet (10 mg total) by mouth at bedtime. 30 tablet 0    Results for orders placed or performed during the hospital encounter of 04/04/17 (from the past 48 hour(s))  Glucose, capillary     Status: Abnormal   Collection Time: 04/10/17  7:21 AM  Result Value Ref Range   Glucose-Capillary 185 (H) 65 - 99 mg/dL   Comment 1 Notify RN   Glucose, capillary     Status: Abnormal   Collection Time: 04/11/17  7:08 AM  Result Value Ref Range   Glucose-Capillary 150 (H) 65 - 99 mg/dL   No results found.  Review of Systems  Constitutional: Negative.   HENT: Negative.   Eyes: Negative.   Respiratory: Negative.   Cardiovascular: Negative.   Gastrointestinal: Negative.   Musculoskeletal: Negative.   Skin: Negative.   Neurological: Negative.   Psychiatric/Behavioral: Positive for depression, memory loss and suicidal ideas. Negative for hallucinations and substance abuse. The patient is nervous/anxious. The patient does not have insomnia.     Blood pressure 132/87, pulse 61, temperature 97.6 F (36.4 C), temperature source Oral, resp. rate 18, height 6\' 2"  (1.88 m), weight 200 lb (90.7 kg), SpO2 97 %. Physical Exam  Nursing note and vitals reviewed. Constitutional: He appears well-developed and well-nourished.  HENT:  Head: Normocephalic and atraumatic.  Eyes: Conjunctivae are normal.  Pupils are equal, round, and reactive to light.  Neck: Normal range of motion.  Cardiovascular: Regular rhythm and normal heart sounds.  Respiratory: Effort normal. No respiratory distress.  GI: Soft.  Musculoskeletal: Normal range of motion.  Neurological: He is alert.  Skin: Skin is warm and dry.  Psychiatric: His affect is blunt. His speech is delayed. He is slowed. Cognition and memory are impaired. He expresses impulsivity. He expresses suicidal ideation. He expresses no suicidal plans.     Assessment/Plan Continue current ECT course which appears to be well tolerated with ongoing assessment of the need for inpatient treatment and another medication management  Alethia Berthold, MD 04/11/2017, 10:26 AM

## 2017-04-11 NOTE — [facility] Group Notes (Signed)
[facility] Group Notes:  (Nursing/MHT/Case Management/Adjunct)  Date:  04/11/2017  Time:  2:28 AM  Type of Therapy:  Group Therapy  Participation Level:  Active  Participation Quality:  Appropriate  Affect:  Appropriate  Cognitive:  Appropriate  Insight:  Appropriate  Engagement in Group:  Engaged  Modes of Intervention:  Discussion  Summary of Progress/Problems:  Kandis Fantasia 04/11/2017, 2:28 AM

## 2017-04-11 NOTE — Anesthesia Postprocedure Evaluation (Signed)
Anesthesia Post Note  Patient: Michael Schmitt  Procedure(s) Performed: ECT TX  Patient location during evaluation: PACU Anesthesia Type: General Level of consciousness: awake and alert Pain management: pain level controlled Vital Signs Assessment: post-procedure vital signs reviewed and stable Respiratory status: spontaneous breathing, nonlabored ventilation and respiratory function stable Cardiovascular status: blood pressure returned to baseline and stable Postop Assessment: no signs of nausea or vomiting Anesthetic complications: no     Last Vitals:  Vitals:   04/11/17 1103 04/11/17 1112  BP: 113/66 126/75  Pulse: 74 78  Resp: 15 12  Temp:    SpO2: 95% 96%    Last Pain:  Vitals:   04/11/17 1112  TempSrc:   PainSc: Asleep                 Elexa Kivi

## 2017-04-11 NOTE — Transfer of Care (Signed)
Immediate Anesthesia Transfer of Care Note  Patient: Michael Schmitt  Procedure(s) Performed: ECT TX  Patient Location: PACU  Anesthesia Type:General  Level of Consciousness: sedated  Airway & Oxygen Therapy: Patient Spontanous Breathing and Patient connected to face mask oxygen  Post-op Assessment: Report given to RN and Post -op Vital signs reviewed and stable  Post vital signs: Reviewed and stable  Last Vitals:  Vitals:   04/11/17 0922 04/11/17 1042  BP: 132/87 133/81  Pulse: 61 88  Resp: 18 16  Temp: 36.4 C 36.7 C  SpO2: 97% 98%    Last Pain:  Vitals:   04/11/17 1042  TempSrc:   PainSc: Asleep         Complications: No apparent anesthesia complications

## 2017-04-11 NOTE — Progress Notes (Signed)
Patient is calm this evening and responding well in groups with peers, voice no complains , took his medicines and went to his Room with out any incidents, support and encouragement is rendered, patient contract for safety of self and others and denies any thoughts of suicide ideations or intent, patient is sleeping long hours at a time without any disturbances, 15 minute safety check is in progress.

## 2017-04-11 NOTE — Anesthesia Post-op Follow-up Note (Signed)
Anesthesia QCDR form completed.        

## 2017-04-11 NOTE — [facility] Group Notes (Signed)
  04/11/2017  Time: 0930  Type of Therapy/Topic:  Group Therapy:  Emotion Regulation  Participation Level:  Did Not Attend   Description of Group:    The purpose of this group is to assist patients in learning to regulate negative emotions and experience positive emotions. Patients will be guided to discuss ways in which they have been vulnerable to their negative emotions. These vulnerabilities will be juxtaposed with experiences of positive emotions or situations, and patients will be challenged to use positive emotions to combat negative ones. Special emphasis will be placed on coping with negative emotions in conflict situations, and patients will process healthy conflict resolution skills.  Therapeutic Goals: 1. Patient will identify two positive emotions or experiences to reflect on in order to balance out negative emotions 2. Patient will label two or more emotions that they find the most difficult to experience 3. Patient will demonstrate positive conflict resolution skills through discussion and/or role plays  Summary of Patient Progress: Pt was invited to attend group but chose not to attend. CSW will continue to encourage pt to attend group throughout their admission.   Therapeutic Modalities:   Cognitive Behavioral Therapy Feelings Identification Dialectical Behavioral Therapy  Alden Hipp, MSW, LCSW 04/11/2017 10:29 AM

## 2017-04-11 NOTE — Tx Team (Signed)
Interdisciplinary Treatment and Diagnostic Plan Update  04/11/2017 Time of Session: 3:15pm Michael Schmitt MRN: [id number]  Principal Diagnosis: Severe recurrent major depression without psychotic features [facility])  Secondary Diagnoses: Principal Problem:   Severe recurrent major depression without psychotic features ([place]) Active Problems:   Combined hyperlipidemia   Coronary artery disease   Chronic Suicidal ideation   Uncontrolled type 2 diabetes mellitus without complication, without long-term current use of insulin (H[facility])   Chronic pain syndrome   Generalized anxiety disorder   Current Medications:  Current Facility-Administered Medications  Medication Dose Route Frequency Provider Last Rate Last Dose  . acetaminophen (TYLENOL) tablet 650 mg  650 mg Oral Q6H PRN Clapacs, Madie Reno, MD   650 mg at 04/09/17 1612  . alum & mag hydroxide-simeth (MAALOX/MYLANTA) 200-200-20 MG/5ML suspension 30 mL  30 mL Oral Q4H PRN Clapacs, John T, MD      . atorvastatin (LIPITOR) tablet 80 mg  80 mg Oral q1800 Clapacs, John T, MD   80 mg at 04/10/17 1710  . carvedilol (COREG) tablet 6.25 mg  6.25 mg Oral BID WC Clapacs, John T, MD   6.25 mg at 04/10/17 1710  . clopidogrel (PLAVIX) tablet 75 mg  75 mg Oral Daily Clapacs, Madie Reno, MD   75 mg at 04/10/17 0806  . dextrose 5 %-0.45 % sodium chloride infusion   Intravenous Continuous Clapacs, John T, MD      . escitalopram (LEXAPRO) tablet 20 mg  20 mg Oral Daily Clapacs, Madie Reno, MD   20 mg at 04/10/17 2947  . haloperidol lactate (HALDOL) injection 5 mg  5 mg Intravenous Once Clapacs, John T, MD      . haloperidol lactate (HALDOL) injection 5 mg  5 mg Intravenous Once Clapacs, John T, MD      . haloperidol lactate (HALDOL) injection 5 mg  5 mg Intravenous Once Clapacs, John T, MD      . haloperidol lactate (HALDOL) injection 5 mg  5 mg Intravenous Once Clapacs, John T, MD      . haloperidol lactate (HALDOL) injection 5 mg  5 mg Intravenous Once Clapacs, John  T, MD      . hydrOXYzine (ATARAX/VISTARIL) tablet 50 mg  50 mg Oral TID PRN Clapacs, Madie Reno, MD   50 mg at 04/09/17 1330  . ketorolac (TORADOL) 30 MG/ML injection           . magnesium hydroxide (MILK OF MAGNESIA) suspension 30 mL  30 mL Oral Daily PRN Clapacs, John T, MD      . metFORMIN (GLUCOPHAGE) tablet 500 mg  500 mg Oral BID WC Clapacs, John T, MD   500 mg at 04/10/17 1710  . midazolam (VERSED) injection 4 mg  4 mg Intravenous Once Clapacs, John T, MD      . midazolam (VERSED) injection 4 mg  4 mg Intravenous Once Clapacs, John T, MD      . midazolam (VERSED) injection 4 mg  4 mg Intravenous Once Clapacs, John T, MD      . midazolam (VERSED) injection 4 mg  4 mg Intravenous Once Clapacs, John T, MD      . mirtazapine (REMERON) tablet 30 mg  30 mg Oral QHS Clapacs, Madie Reno, MD   30 mg at 04/10/17 2120  . ondansetron (ZOFRAN) injection 4 mg  4 mg Intravenous Once PRN Gunnar Bulla, MD      . ondansetron [facility]) injection 4 mg  4 mg Intravenous Once PRN Gunnar Fusi,  MD      . prazosin (MINIPRESS) capsule 2 mg  2 mg Oral QHS Clapacs, Madie Reno, MD   2 mg at 04/10/17 2120  . QUEtiapine (SEROQUEL) tablet 150 mg  150 mg Oral QHS Clapacs, Madie Reno, MD   150 mg at 04/10/17 2119  . traZODone (DESYREL) tablet 100 mg  100 mg Oral QHS PRN Clapacs, Madie Reno, MD   100 mg at 04/10/17 2120   PTA Medications: Medications Prior to Admission  Medication Sig Dispense Refill Last Dose  . aspirin EC 81 MG EC tablet Take 1 tablet (81 mg total) by mouth daily. 30 tablet 1 04/10/2017 at Unknown time  . atorvastatin (LIPITOR) 80 MG tablet Take 1 tablet (80 mg total) by mouth daily at 6 PM. 30 tablet 1 04/10/2017 at Unknown time  . carvedilol (COREG) 6.25 MG tablet Take 1 tablet (6.25 mg total) by mouth 2 (two) times daily with a meal. 60 tablet 1 04/10/2017 at Unknown time  . clopidogrel (PLAVIX) 75 MG tablet Take 1 tablet (75 mg total) by mouth daily. 30 tablet 1 04/10/2017 at Unknown time  . escitalopram (LEXAPRO)  20 MG tablet Take 1 tablet (20 mg total) by mouth daily. 30 tablet 1 04/10/2017 at Unknown time  . hydrOXYzine (ATARAX/VISTARIL) 50 MG tablet Take 1 tablet (50 mg total) by mouth 3 (three) times daily as needed for anxiety. 90 tablet 1 04/10/2017 at Unknown time  . lurasidone 60 MG TABS Take 1 tablet (60 mg total) by mouth daily with breakfast. 30 tablet 1 04/10/2017 at Unknown time  . metFORMIN (GLUCOPHAGE) 500 MG tablet Take 1 tablet (500 mg total) by mouth 2 (two) times daily with a meal. 60 tablet 1 04/10/2017 at Unknown time  . mirtazapine (REMERON) 15 MG tablet Take 1 tablet (15 mg total) by mouth at bedtime. 30 tablet 0 04/10/2017 at Unknown time  . nitroGLYCERIN (NITROSTAT) 0.4 MG SL tablet Place 1 tablet (0.4 mg total) under the tongue every 5 (five) minutes x 3 doses as needed. 10 tablet 0 04/10/2017 at Unknown time  . prazosin (MINIPRESS) 2 MG capsule Take 1 capsule (2 mg total) by mouth at bedtime. 30 capsule 1 04/10/2017 at Unknown time  . traZODone (DESYREL) 100 MG tablet Take 1 tablet (100 mg total) by mouth at bedtime as needed for sleep. 30 tablet 1 04/10/2017 at Unknown time  . zolpidem (AMBIEN) 10 MG tablet Take 1 tablet (10 mg total) by mouth at bedtime. 30 tablet 0 04/10/2017 at Unknown time    Patient Stressors: Legal issue Medication change or noncompliance  Patient Strengths: Average or above average intelligence Communication skills Supportive family/friends  Treatment Modalities: Medication Management, Group therapy, Case management,  1 to 1 session with clinician, Psychoeducation, Recreational therapy.   Physician Treatment Plan for Primary Diagnosis: Severe recurrent major depression without psychotic features ([place]) Long Term Goal(s): Improvement in symptoms so as ready for discharge Improvement in symptoms so as ready for discharge   Short Term Goals: Ability to verbalize feelings will improve Ability to disclose and discuss suicidal ideas Ability to demonstrate  self-control will improve Ability to demonstrate self-control will improve Ability to identify and develop effective coping behaviors will improve  Medication Management: Evaluate patient's response, side effects, and tolerance of medication regimen.  Therapeutic Interventions: 1 to 1 sessions, Unit Group sessions and Medication administration.  Evaluation of Outcomes: Progressing  Physician Treatment Plan for Secondary Diagnosis: Principal Problem:   Severe recurrent major depression without psychotic features ([place])  Active Problems:   Combined hyperlipidemia   Coronary artery disease   Chronic Suicidal ideation   Uncontrolled type 2 diabetes mellitus without complication, without long-term current use of insulin (HCC)   Chronic pain syndrome   Generalized anxiety disorder  Long Term Goal(s): Improvement in symptoms so as ready for discharge Improvement in symptoms so as ready for discharge   Short Term Goals: Ability to verbalize feelings will improve Ability to disclose and discuss suicidal ideas Ability to demonstrate self-control will improve Ability to demonstrate self-control will improve Ability to identify and develop effective coping behaviors will improve     Medication Management: Evaluate patient's response, side effects, and tolerance of medication regimen.  Therapeutic Interventions: 1 to 1 sessions, Unit Group sessions and Medication administration.  Evaluation of Outcomes: Progressing   RN Treatment Plan for Primary Diagnosis: Severe recurrent major depression without psychotic features ([place]) Long Term Goal(s): Knowledge of disease and therapeutic regimen to maintain health will improve  Short Term Goals: Ability to demonstrate self-control, Ability to participate in decision making will improve, Ability to disclose and discuss suicidal ideas and Ability to identify and develop effective coping behaviors will improve  Medication Management: RN will administer  medications as ordered by provider, will assess and evaluate patient's response and provide education to patient for prescribed medication. RN will report any adverse and/or side effects to prescribing provider.  Therapeutic Interventions: 1 on 1 counseling sessions, Psychoeducation, Medication administration, Evaluate responses to treatment, Monitor vital signs and CBGs as ordered, Perform/monitor CIWA, COWS, AIMS and Fall Risk screenings as ordered, Perform wound care treatments as ordered.  Evaluation of Outcomes: Progressing   LCSW Treatment Plan for Primary Diagnosis: Severe recurrent major depression without psychotic features ([place]) Long Term Goal(s): Safe transition to appropriate next level of care at discharge, Engage patient in therapeutic group addressing interpersonal concerns.  Short Term Goals: Engage patient in aftercare planning with referrals and resources, Facilitate patient progression through stages of change regarding substance use diagnoses and concerns and Increase skills for wellness and recovery  Therapeutic Interventions: Assess for all discharge needs, 1 to 1 time with Social worker, Explore available resources and support systems, Assess for adequacy in community support network, Educate family and significant other(s) on suicide prevention, Complete Psychosocial Assessment, Interpersonal group therapy.  Evaluation of Outcomes: Progressing   Progress in Treatment: Attending groups: Yes. Participating in groups: Yes. Taking medication as prescribed: Yes. Toleration medication: Yes. Family/Significant other contact made: No. Attempted to contact sister 2x. Patient understands diagnosis: Yes. Discussing patient identified problems/goals with staff: Yes. Medical problems stabilized or resolved: Yes. Denies suicidal/homicidal ideation: No. Issues/concerns per patient self-inventory: No. Other:  New problem(s) identified: No, Describe:  None  New Short Term/Long  Term Goal(s): "I want to get better."  Discharge Plan or Barriers: To return home with sister and follow up with [name] for ECT and CBC for medication management.  Reason for Continuation of Hospitalization: Depression Medication stabilization Suicidal ideation  Estimated Length of Stay: 5days  Attendees: Patient: Michael Schmitt 04/11/2017 11:42 AM  Physician: Alethia Berthold, MD 04/11/2017 11:42 AM  Nursing:  04/11/2017 11:42 AM  RN Care Manager: 04/11/2017 11:42 AM  Social Worker: Darin Engels, [place] 04/11/2017 11:42 AM  Recreational Therapist:  04/11/2017 11:42 AM  Other:  04/11/2017 11:42 AM  Other:  04/11/2017 11:42 AM  Other: 04/11/2017 11:42 AM    Scribe for Treatment Team: Darin Engels, LCSW 04/11/2017 11:42 AM

## 2017-04-12 DIAGNOSIS — I2510 Atherosclerotic heart disease of native coronary artery without angina pectoris: Secondary | ICD-10-CM

## 2017-04-12 LAB — GLUCOSE, CAPILLARY: GLUCOSE-CAPILLARY: 153 mg/dL — AB (ref 65–99)

## 2017-04-12 NOTE — [facility] Group Notes (Signed)
04/12/2017 9:30AM  Type of Therapy/Topic:  Group Therapy:  Balance in Life  Participation Level:  None  Description of Group:   This group will address the concept of balance and how it feels and looks when one is unbalanced. Patients will be encouraged to process areas in their lives that are out of balance and identify reasons for remaining unbalanced. Facilitators will guide patients in utilizing problem-solving interventions to address and correct the stressor making their life unbalanced. Understanding and applying boundaries will be explored and addressed for obtaining and maintaining a balanced life. Patients will be encouraged to explore ways to assertively make their unbalanced needs known to significant others in their lives, using other group members and facilitator for support and feedback.  Therapeutic Goals: 1. Patient will identify two or more emotions or situations they have that consume much of in their lives. 2. Patient will identify signs/triggers that life has become out of balance:  3. Patient will identify two ways to set boundaries in order to achieve balance in their lives:  4. Patient will demonstrate ability to communicate their needs through discussion and/or role plays  Summary of Patient Progress: Michael Schmitt did not participate but he did stay the entire time and listen to others statements.        Therapeutic Modalities:   Cognitive Behavioral Therapy Solution-Focused Therapy Assertiveness Training  Darin Engels, 

## 2017-04-12 NOTE — Progress Notes (Signed)
D: Patient denies SI/HI/AVH. Patient verbally contracts for safety. Patient is calm, cooperative and pleasant. Patient is seen in milieu interacting with peers. Patient has no complaints at this time. Patient to remain NPO after midnight.  A: Patient was assessed by this nurse. Patient was oriented to unit. Patient's safety was maintained on unit. Q x 15 minute observation checks were completed for safety. Patient care plan was reviewed. Patient was offered support and encouragement. Patient was encourage to attend groups, participate in unit activities and continue with plan of care.   R: Patient has no complaints of pain at this time. Patient is receptive to treatment and safety maintained on unit.

## 2017-04-12 NOTE — Plan of Care (Signed)
Patient is oriented to unit and safety is maintained. Patient denies SI/HI at this time. Patient able to understand information directed at patient.    Progressing Education: Knowledge of [place] Education information/materials will improve 04/12/2017 2032 - Progressing by Alyson Locket I, RN Verbalization of understanding the information provided will improve 04/12/2017 2032 - Progressing by [place], RN Safety: Ability to disclose and discuss suicidal ideas will improve 04/12/2017 2032 - Progressing by Alyson Locket I, RN Safety: Ability to remain free from injury will improve 04/12/2017 2032 - Progressing by An[place], RN

## 2017-04-12 NOTE — Progress Notes (Signed)
Recreation Therapy Notes   Date: 01.17.2019  Time: 1:00 PM  Location: Craft Room  Behavioral response: Appropriate  Intervention Topic: Anger  Discussion/Intervention: Group content on today was focused on anger management. The group defined anger and reasons they become angry. Individuals expressed negative way they have dealt with anger in the past. Patients stated some positive ways they could deal with anger in the future. The group described how anger can affect your health and daily plans. Individuals participated in the intervention "Score your anger" where they had a chance to answer questions about themselves and get a score of their anger.  Clinical Observations/Feedback:  Patient came to group and stated that sometimes he expresses he is angry by crying. Individual participated in the intervention and continues to make progress towards his goals.  Michael Schmitt LRT/CTRS         Michael Schmitt 04/13/2017 9:05 AM

## 2017-04-12 NOTE — Progress Notes (Signed)
[facility] MD Progress Note  04/12/2017 8:03 PM Michael Schmitt  MRN:  [id number] Subjective: The patient reports he is feeling okay today.  Does not feel overly depressed.  No current suicidal ideation.  Not feeling panicky.  Mild memory loss.  Some headache still Principal Problem: Severe recurrent major depression without psychotic features ([place]) Diagnosis:   Patient Active Problem List   Diagnosis Date Noted  . Severe recurrent major depression without psychotic features ([place]) [F33.2] 03/12/2017    Priority: High  . Generalized anxiety disorder [F41.1] 02/05/2017    Priority: High  . Chronic Suicidal ideation [R45.851] 09/15/2016    Priority: High  . Chronic pain syndrome [G89.4] 11/21/2016    Priority: Medium  . Uncontrolled type 2 diabetes mellitus without complication, without long-term current use of insulin ([place]) [E11.65] 10/19/2016    Priority: Medium  . Coronary artery disease [I25.10] 09/12/2016    Priority: Medium  . Combined hyperlipidemia [E78.2] 01/05/2016    Priority: Low  . Chronic neck pain (Primary Area of Pain) (Left) [M54.2, G89.29] 11/22/2016  . Cervical facet syndrome (Left) [A07.622] 11/22/2016  . Cervical radiculitis (Left) [M54.12] 11/22/2016  . History of cervical spinal surgery [Z98.890] 11/22/2016  . Cervical spondylosis [M47.22] 11/22/2016  . Chronic shoulder pain (Secondary Area of Pain) (Left) [M25.512, G89.29] 11/22/2016  . 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 Chronic low back pain [facility] Area of Pain) (Bilateral) (L>R) [M54.5, G89.29] 11/22/2016  . Lumbar facet syndrome (Bilateral) (L>R) [M47.816] 11/22/2016  . Long term prescription benzodiazepine use [Z79.899] 11/21/2016  . Opiate use [F11.90] 11/21/2016  . HTN (hypertension) [I10] 06/26/2016  . GERD (gastroesophageal reflux disease) [K21.9] 06/26/2016  . Alcohol use disorder, severe, 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[place]) [F10.21] 06/26/2016  . Moderate benzodiazepine use disorder ([place])  [F13.20] 06/11/2016  . Sedative, hypnotic or anxiolytic use disorder, severe, dependence ([place]) [F13.20] 05/12/2016  . Ventricular fibrillation ([place]) [I49.01] 04/05/2016  . Cervical spondylosis without myelopathy [M47.812] 07/24/2014  . Severe episode of recurrent major depressive disorder, without psychotic features ([place]) [F33.2] 04/15/2013  . Tobacco use disorder [F17.200] 10/20/2010  . Asthma [J45.909] 10/20/2010   Total Time spent with patient: 20 minutes  Past Psychiatric History: Long history of depression currently getting ECT  Past Medical History:  Past Medical History:  Diagnosis Date  . Anxiety   . Anxiety   . Arthritis    cerv. stenosis, spondylosis, HNP- lower back , has been followed in pain clinic, has  had injection s in cerv. area  . Blood dyscrasia    told that when he was young he was a" free bleeder"  . CAD (coronary artery disease)   . Cervical spondylosis without myelopathy 07/24/2014  . Cervicogenic headache 07/24/2014  . Chronic kidney disease    renal calculi- passed spontaneously  . Depression   . Diabetes mellitus without complication ([place])   . Fatty liver   . GERD (gastroesophageal reflux disease)   . Headache(784.0)   . Hyperlipidemia   . Hypertension   . Mental disorder   . MI, old   . RLS (restless legs syndrome)    detected on sleep study  . Shortness of breath   . Ventricular fibrillation ([place]) 04/05/2016    Past Surgical History:  Procedure Laterality Date  . ANTERIOR CERVICAL DECOMP/DISCECTOMY FUSION  11/13/2011   Procedure: ANTERIOR CERVICAL DECOMPRESSION/DISCECTOMY FUSION 1 LEVEL;  Surgeon: [name], MD;  Location: [place] [facility];  Service: Neurosurgery;  Laterality: N/A;  Anterior Cervical Decompression/discectomy Fusion. Cervical three-four.  Marland Kitchen  CARDIAC CATHETERIZATION N/A 01/01/2015   Procedure: Left Heart Cath and Coronary Angiography;  Surgeon: [name], MD;  Location: [place] [facility];  Service: Cardiovascular;  Laterality: N/A;   . CARDIAC CATHETERIZATION N/A 04/05/2016   Procedure: Left Heart Cath and Coronary Angiography;  Surgeon: Belva Crome, MD;  Location: [place] [facility];  Service: Cardiovascular;  Laterality: N/A;  . CARDIAC CATHETERIZATION N/A 04/05/2016   Procedure: Coronary Stent Intervention;  Surgeon: Belva Crome, MD;  Location: [place] [facility] LAB;  Service: Cardiovascular;  Laterality: N/A;  . CARDIAC CATHETERIZATION N/A 04/05/2016   Procedure: Intravascular Ultrasound/IVUS;  Surgeon: Belva Crome, MD;  Location: [place] [facility] LAB;  Service: Cardiovascular;  Laterality: N/A;  . NASAL SINUS SURGERY     2005   Family History:  Family History  Problem Relation Age of Onset  . Prostate cancer Father   . Hypertension Mother   . Kidney Stones Mother   . Anxiety disorder Mother   . Depression Mother   . COPD Sister   . Hypertension Sister   . Diabetes Sister   . Depression Sister   . Anxiety disorder Sister   . Seizures Sister   . ADD / ADHD Son   . ADD / ADHD Daughter    Family Psychiatric  History: Positive for anxiety Social History:  Social History   Substance and Sexual Activity  Alcohol Use No  . Alcohol/week: 0.0 oz   Comment: last drinked in 3 months.      Social History   Substance and Sexual Activity  Drug Use No    Social History   Socioeconomic History  . Marital status: Widowed    Spouse name: None  . Number of children: 3  . Years of education: 45  . Highest education level: None  Social Needs  . Financial resource strain: None  . Food insecurity - worry: None  . Food insecurity - inability: None  . Transportation needs - medical: None  . Transportation needs - non-medical: None  Occupational History    Comment: unemployed  Tobacco Use  . Smoking status: Current Every Day Smoker    Packs/day: 2.00    Years: 17.00    Pack years: 34.00    Types: Cigarettes, Cigars  . Smokeless tobacco: Former Systems developer  . Tobacco comment: occassional snuff   Substance and  Sexual Activity  . Alcohol use: No    Alcohol/week: 0.0 oz    Comment: last drinked in 3 months.   . Drug use: No  . Sexual activity: Not Currently  Other Topics Concern  . None  Social History Narrative   Patient is right handed.   Patient drinks 2 sodas daily.   Additional Social History:                         Sleep: Negative  Appetite:  Negative  Current Medications: Current Facility-Administered Medications  Medication Dose Route Frequency Provider Last Rate Last Dose  . acetaminophen (TYLENOL) tablet 650 mg  650 mg Oral Q6H PRN Clapacs, Madie Reno, MD   650 mg at 04/11/17 1207  . alum & mag hydroxide-simeth (MAALOX/MYLANTA) 200-200-20 MG/5ML suspension 30 mL  30 mL Oral Q4H PRN Clapacs, John T, MD      . atorvastatin (LIPITOR) tablet 80 mg  80 mg Oral q1800 Clapacs, Madie Reno, MD   80 mg at 04/12/17 1843  . carvedilol (COREG) tablet 6.25 mg  6.25 mg Oral BID  WC Clapacs, Madie Reno, MD   6.25 mg at 04/12/17 1843  . clopidogrel (PLAVIX) tablet 75 mg  75 mg Oral Daily Clapacs, Madie Reno, MD   75 mg at 04/12/17 0859  . dextrose 5 %-0.45 % sodium chloride infusion   Intravenous Continuous Clapacs, John T, MD      . escitalopram (LEXAPRO) tablet 20 mg  20 mg Oral Daily Clapacs, John T, MD   20 mg at 04/12/17 0859  . haloperidol lactate (HALDOL) injection 5 mg  5 mg Intravenous Once Clapacs, John T, MD      . haloperidol lactate (HALDOL) injection 5 mg  5 mg Intravenous Once Clapacs, John T, MD      . haloperidol lactate (HALDOL) injection 5 mg  5 mg Intravenous Once Clapacs, John T, MD      . haloperidol lactate (HALDOL) injection 5 mg  5 mg Intravenous Once Clapacs, John T, MD      . haloperidol lactate (HALDOL) injection 5 mg  5 mg Intravenous Once Clapacs, John T, MD      . hydrOXYzine (ATARAX/VISTARIL) tablet 50 mg  50 mg Oral TID PRN Clapacs, Madie Reno, MD   50 mg at 04/09/17 1330  . ibuprofen (ADVIL,MOTRIN) tablet 600 mg  600 mg Oral Q8H PRN Clapacs, Madie Reno, MD   600 mg at 04/11/17  1721  . magnesium hydroxide (MILK OF MAGNESIA) suspension 30 mL  30 mL Oral Daily PRN Clapacs, John T, MD      . metFORMIN (GLUCOPHAGE) tablet 500 mg  500 mg Oral BID WC Clapacs, Madie Reno, MD   500 mg at 04/12/17 1844  . midazolam (VERSED) injection 4 mg  4 mg Intravenous Once Clapacs, John T, MD      . midazolam (VERSED) injection 4 mg  4 mg Intravenous Once Clapacs, John T, MD      . midazolam (VERSED) injection 4 mg  4 mg Intravenous Once Clapacs, John T, MD      . midazolam (VERSED) injection 4 mg  4 mg Intravenous Once Clapacs, John T, MD      . mirtazapine (REMERON) tablet 45 mg  45 mg Oral QHS Clapacs, Madie Reno, MD   45 mg at 04/11/17 2120  . ondansetron (ZOFRAN) injection 4 mg  4 mg Intravenous Once PRN Gunnar Bulla, MD      . ondansetron [facility]) injection 4 mg  4 mg Intravenous Once PRN Gunnar Fusi, MD      . prazosin (MINIPRESS) capsule 2 mg  2 mg Oral QHS Clapacs, Madie Reno, MD   2 mg at 04/11/17 2120  . QUEtiapine (SEROQUEL) tablet 200 mg  200 mg Oral QHS Clapacs, John T, MD   200 mg at 04/11/17 2120  . traZODone (DESYREL) tablet 100 mg  100 mg Oral QHS PRN Clapacs, Madie Reno, MD   100 mg at 04/11/17 2120    Lab Results:  Results for orders placed or performed during the hospital encounter of 04/04/17 (from the past 48 hour(s))  Glucose, capillary     Status: Abnormal   Collection Time: 04/11/17  7:08 AM  Result Value Ref Range   Glucose-Capillary 150 (H) 65 - 99 mg/dL  Glucose, capillary     Status: Abnormal   Collection Time: 04/11/17 10:52 AM  Result Value Ref Range   Glucose-Capillary 137 (H) 65 - 99 mg/dL  Glucose, capillary     Status: Abnormal   Collection Time: 04/12/17  7:06 AM  Result Value Ref  Range   Glucose-Capillary 153 (H) 65 - 99 mg/dL   Comment 1 Notify RN     Blood Alcohol level:  Lab Results  Component Value Date   ETH 37 (H) 03/12/2017   ETH <10 85/46/2703    Metabolic Disorder Labs: Lab Results  Component Value Date   HGBA1C 6.0 (H)  03/13/2017   MPG 126 03/13/2017   MPG 177 09/12/2016   Lab Results  Component Value Date   PROLACTIN 18.1 (H) 09/12/2016   PROLACTIN 6.5 05/27/2016   Lab Results  Component Value Date   CHOL 118 03/13/2017   TRIG 325 (H) 03/13/2017   HDL 31 (L) 03/13/2017   CHOLHDL 3.8 03/13/2017   VLDL 65 (H) 03/13/2017   LDLCALC 22 03/13/2017   LDLCALC 37 09/12/2016    Physical Findings: AIMS: Facial and Oral Movements Muscles of Facial Expression: None, normal Lips and Perioral Area: None, normal Jaw: None, normal Tongue: None, normal,Extremity Movements Upper (arms, wrists, hands, fingers): None, normal Lower (legs, knees, ankles, toes): None, normal, Trunk Movements Neck, shoulders, hips: None, normal, Overall Severity Severity of abnormal movements (highest score from questions above): None, normal Incapacitation due to abnormal movements: None, normal Patient's awareness of abnormal movements (rate only patient's report): No Awareness, Dental Status Current problems with teeth and/or dentures?: No Does patient usually wear dentures?: No  CIWA:    COWS:     Musculoskeletal: Strength & Muscle Tone: within normal limits Gait & Station: normal Patient leans: N/A  Psychiatric Specialty Exam: Physical Exam  Nursing note and vitals reviewed. Constitutional: He appears well-developed and well-nourished.  HENT:  Head: Normocephalic and atraumatic.  Eyes: Conjunctivae are normal. Pupils are equal, round, and reactive to light.  Neck: Normal range of motion.  Cardiovascular: Regular rhythm and normal heart sounds.  Respiratory: Effort normal. No respiratory distress.  GI: Soft.  Musculoskeletal: Normal range of motion.  Neurological: He is alert.  Skin: Skin is warm and dry.  Psychiatric: Judgment normal. His speech is delayed. He is slowed. Cognition and memory are normal. He exhibits a depressed mood. He expresses no suicidal ideation.    Review of Systems  Constitutional:  Negative.   HENT: Negative.   Eyes: Negative.   Respiratory: Negative.   Cardiovascular: Negative.   Gastrointestinal: Negative.   Musculoskeletal: Negative.   Skin: Negative.   Neurological: Negative.   Psychiatric/Behavioral: Positive for depression and memory loss. Negative for hallucinations, substance abuse and suicidal ideas. The patient is nervous/anxious. The patient does not have insomnia.     Blood pressure 135/81, pulse 81, temperature 98.1 F (36.7 C), resp. rate 12, height 6\' 2"  (1.88 m), weight 90.7 kg (200 lb), SpO2 99 %.Body mass index is 25.68 kg/m.  General Appearance: Casual  Eye Contact:  Good  Speech:  Clear and Coherent  Volume:  Normal  Mood:  Dysphoric  Affect:  Constricted  Thought Process:  Goal Directed  Orientation:  Full (Time, Place, and Person)  Thought Content:  Logical  Suicidal Thoughts:  No  Homicidal Thoughts:  No  Memory:  Immediate;   Fair Recent;   Fair Remote;   Fair  Judgement:  Fair  Insight:  Fair  Psychomotor Activity:  Decreased  Concentration:  Concentration: Fair  Recall:  [employer] of Knowledge:  Fair  Language:  Fair  Akathisia:  No  Handed:  Right  AIMS (if indicated):     Assets:  Desire for Improvement Housing Resilience Social Support  ADL's:  Intact  Cognition:  WNL  Sleep:  Number of Hours: 7.15     Treatment Plan Summary: Daily contact with patient to assess and evaluate symptoms and progress in treatment, Medication management and Plan Patient seems to be showing gradual improvement with ECT.  Given the failure of his last discharge we are being cautious about plans to get him out of the hospital.  I gave him a lot of encouragement and praise and his current tolerance of medicine and his improvement.  We may consider discharge within the next few days.  Meanwhile his blood pressure and diabetes are under good control no change to medicine today.  Next ECT tomorrow.  Alethia Berthold, MD 04/12/2017, 8:03 PM

## 2017-04-12 NOTE — BHH Group Notes (Signed)
[facility] Group Notes:  (Nursing/MHT/Case Management/Adjunct)  Date:  04/12/2017  Time:  3:43 PM  Type of Therapy:  Psychoeducational Skills  Participation Level:  None  Participation Quality:  Resistant  Affect:  Blunted  Cognitive:  Appropriate  Insight:  Lacking  Engagement in Group:  Limited  Modes of Intervention:  Discussion and Education  Summary of Progress/Problems:  Kathi Ludwig 04/12/2017, 3:43 PM

## 2017-04-12 NOTE — BHH Group Notes (Signed)
LCSW Group Therapy Note 04/12/2017 9:00 AM  Type of Therapy and Topic:  Group Therapy:  Setting Goals  Participation Level:  Minimal  Description of Group: In this process group, patients discussed using strengths to work toward goals and address challenges.  Patients identified two positive things about themselves and one goal they were working on.  Patients were given the opportunity to share openly and support each other's plan for self-empowerment.  The group discussed the value of gratitude and were encouraged to have a daily reflection of positive characteristics or circumstances.  Patients were encouraged to identify a plan to utilize their strengths to work on current challenges and goals.  Therapeutic Goals 1. Patient will verbalize personal strengths/positive qualities and relate how these can assist with achieving desired personal goals 2. Patients will verbalize affirmation of peers plans for personal change and goal setting 3. Patients will explore the value of gratitude and positive focus as related to successful achievement of goals 4. Patients will verbalize a plan for regular reinforcement of personal positive qualities and circumstances.  Summary of Patient Progress:  Michael Schmitt participated some in Setting Goals group. He stated that he had a good understanding about SMART goals, but he didn't really have a goal that he wanted to to set himself  today.    Therapeutic Modalities Cognitive Behavioral Therapy Motivational Interviewing    [name], [place] 04/12/2017 2:56 PM

## 2017-04-13 ENCOUNTER — Other Ambulatory Visit: Payer: Self-pay | Admitting: Psychiatry

## 2017-04-13 ENCOUNTER — Inpatient Hospital Stay: Payer: Medicare Other | Admitting: Anesthesiology

## 2017-04-13 LAB — GLUCOSE, CAPILLARY
GLUCOSE-CAPILLARY: 159 mg/dL — AB (ref 65–99)
GLUCOSE-CAPILLARY: 129 mg/dL — AB (ref 65–99)

## 2017-04-13 MED ORDER — SODIUM CHLORIDE 0.9 % IV SOLN
500.0000 mL | Freq: Once | INTRAVENOUS | Status: AC
  Administered 2017-04-13: 500 mL via INTRAVENOUS

## 2017-04-13 MED ORDER — QUETIAPINE FUMARATE 200 MG PO TABS
300.0000 mg | ORAL_TABLET | Freq: Every day | ORAL | Status: DC
  Administered 2017-04-13 – 2017-04-15 (×3): 300 mg via ORAL
  Filled 2017-04-13 (×3): qty 1

## 2017-04-13 MED ORDER — METHOHEXITAL SODIUM 100 MG/10ML IV SOSY
PREFILLED_SYRINGE | INTRAVENOUS | Status: DC | PRN
  Administered 2017-04-13: 100 mg via INTRAVENOUS

## 2017-04-13 MED ORDER — SODIUM CHLORIDE 0.9 % IV SOLN
INTRAVENOUS | Status: DC | PRN
  Administered 2017-04-13: 09:00:00 via INTRAVENOUS

## 2017-04-13 MED ORDER — KETOROLAC TROMETHAMINE 30 MG/ML IJ SOLN
30.0000 mg | Freq: Once | INTRAMUSCULAR | Status: AC
  Administered 2017-04-13: 30 mg via INTRAVENOUS

## 2017-04-13 MED ORDER — KETOROLAC TROMETHAMINE 30 MG/ML IJ SOLN
INTRAMUSCULAR | Status: AC
  Filled 2017-04-13: qty 1

## 2017-04-13 MED ORDER — SUCCINYLCHOLINE CHLORIDE 200 MG/10ML IV SOSY
PREFILLED_SYRINGE | INTRAVENOUS | Status: DC | PRN
  Administered 2017-04-13: 150 mg via INTRAVENOUS

## 2017-04-13 MED ORDER — MIDAZOLAM HCL 2 MG/2ML IJ SOLN
INTRAMUSCULAR | Status: AC
  Filled 2017-04-13: qty 4

## 2017-04-13 NOTE — Anesthesia Procedure Notes (Signed)
Date/Time: 04/13/2017 11:00 AM Performed by: Dionne Bucy, CRNA Pre-anesthesia Checklist: Patient identified, Emergency Drugs available, Suction available and Patient being monitored Patient Re-evaluated:Patient Re-evaluated prior to induction Oxygen Delivery Method: Circle system utilized Preoxygenation: Pre-oxygenation with 100% oxygen Induction Type: IV induction Ventilation: Mask ventilation without difficulty and Mask ventilation throughout procedure Airway Equipment and Method: Bite block Placement Confirmation: positive ETCO2 Dental Injury: Teeth and Oropharynx as per pre-operative assessment

## 2017-04-13 NOTE — Plan of Care (Signed)
Patient's safety is maintained on the unit. Patient denies SI/HI. Patient is oriented to unit.    Progressing Education: Knowledge of [place] Education information/materials will improve 04/13/2017 2033 - Progressing by Alyson Locket I, RN Safety: Ability to disclose and discuss suicidal ideas will improve 04/13/2017 2033 - Progressing by Alyson Locket I, RN Safety: Ability to remain free from injury will improve 04/13/2017 2033 - Progressing by Anson [place], RN

## 2017-04-13 NOTE — Anesthesia Preprocedure Evaluation (Signed)
Anesthesia Evaluation  Patient identified by MRN, date of birth, ID band Patient awake    Reviewed: Allergy & Precautions, H&P , NPO status , Patient's Chart, lab work & pertinent test results  History of Anesthesia Complications Negative for: history of anesthetic complications  Airway Mallampati: III  TM Distance: <3 FB Neck ROM: limited    Dental  (+) Chipped, Poor Dentition, Missing   Pulmonary shortness of breath and with exertion, asthma , Current Smoker,           Cardiovascular Exercise Tolerance: Poor hypertension, (-) angina+ CAD and + Past MI  + dysrhythmias      Neuro/Psych  Headaches, PSYCHIATRIC DISORDERS Anxiety Depression  Neuromuscular disease    GI/Hepatic Neg liver ROS, GERD  Controlled,  Endo/Other  diabetes, Type 2  Renal/GU Renal disease  negative genitourinary   Musculoskeletal  (+) Arthritis ,   Abdominal   Peds  Hematology  (+) Blood dyscrasia, ,   Anesthesia Other Findings Past Medical History: No date: Anxiety No date: Anxiety No date: Arthritis     Comment:  cerv. stenosis, spondylosis, HNP- lower back , has been               followed in pain clinic, has  had injection s in cerv.               area No date: Blood dyscrasia     Comment:  told that when he was young he was a" free bleeder" No date: CAD (coronary artery disease) 07/24/2014: Cervical spondylosis without myelopathy 07/24/2014: Cervicogenic headache No date: Chronic kidney disease     Comment:  renal calculi- passed spontaneously No date: Depression No date: Diabetes mellitus without complication (HCC) No date: Fatty liver No date: GERD (gastroesophageal reflux disease) No date: Headache(784.0) No date: Hyperlipidemia No date: Hypertension No date: Mental disorder No date: MI, old No date: RLS (restless legs syndrome)     Comment:  detected on sleep study No date: Shortness of breath 04/05/2016: Ventricular  fibrillation ([place])  Past Surgical History: 11/13/2011: ANTERIOR CERVICAL DECOMP/DISCECTOMY FUSION     Comment:  Procedure: ANTERIOR CERVICAL DECOMPRESSION/DISCECTOMY               FUSION 1 LEVEL;  Surgeon: [name], MD;  Location: [place]               NEURO ORS;  Service: Neurosurgery;  Laterality: N/A;                Anterior Cervical Decompression/discectomy Fusion.               Cervical three-four. 01/01/2015: CARDIAC CATHETERIZATION; N/A     Comment:  Procedure: Left Heart Cath and Coronary Angiography;                Surgeon: [name], MD;  Location: [place] [facility]               LAB;  Service: Cardiovascular;  Laterality: N/A; 04/05/2016: CARDIAC CATHETERIZATION; N/A     Comment:  Procedure: Left Heart Cath and Coronary Angiography;                Surgeon: Belva Crome, MD;  Location: [place] [facility]               LAB;  Service: Cardiovascular;  Laterality: N/A; 04/05/2016: CARDIAC CATHETERIZATION; N/A     Comment:  Procedure: Coronary Stent Intervention;  Surgeon: Mallie Mussel  Nicholes Stairs, MD;  Location: [place] CV LAB;  Service:               Cardiovascular;  Laterality: N/A; 04/05/2016: CARDIAC CATHETERIZATION; N/A     Comment:  Procedure: Intravascular Ultrasound/IVUS;  Surgeon:               Belva Crome, MD;  Location: [place] [facility] LAB;                Service: Cardiovascular;  Laterality: N/A; No date: NASAL SINUS SURGERY     Comment:  2005  BMI    Body Mass Index:  25.94 kg/m      Reproductive/Obstetrics negative OB ROS                             Anesthesia Physical  Anesthesia Plan  ASA: III  Anesthesia Plan: General   Post-op Pain Management:    Induction: Intravenous  PONV Risk Score and Plan:   Airway Management Planned: Natural Airway and Mask  Additional Equipment:   Intra-op Plan:   Post-operative Plan:   Informed Consent: I have reviewed the patients History and Physical, chart, labs and discussed the  procedure including the risks, benefits and alternatives for the proposed anesthesia with the patient or authorized representative who has indicated his/her understanding and acceptance.   Dental Advisory Given  Plan Discussed with: Anesthesiologist, CRNA and Surgeon  Anesthesia Plan Comments: (Patient consented for risks of anesthesia including but not limited to:  - adverse reactions to medications - risk of intubation if required - damage to teeth, lips or other oral mucosa - sore throat or hoarseness - Damage to heart, brain, lungs or loss of life  Patient voiced understanding.)        Anesthesia Quick Evaluation

## 2017-04-13 NOTE — Anesthesia Postprocedure Evaluation (Signed)
Anesthesia Post Note  Patient: Michael Schmitt  Procedure(s) Performed: ECT TX  Patient location during evaluation: PACU Anesthesia Type: General Level of consciousness: awake and alert Pain management: pain level controlled Vital Signs Assessment: post-procedure vital signs reviewed and stable Respiratory status: spontaneous breathing, nonlabored ventilation, respiratory function stable and patient connected to nasal cannula oxygen Cardiovascular status: blood pressure returned to baseline and stable Postop Assessment: no apparent nausea or vomiting Anesthetic complications: no     Last Vitals:  Vitals:   04/13/17 1127 04/13/17 1139  BP: 109/61 108/63  Pulse: 92 90  Resp: 17 20  Temp:  37.1 C  SpO2: 94% 94%    Last Pain:  Vitals:   04/13/17 1139  TempSrc:   PainSc: 0-No pain                 Precious Haws Illyanna Petillo

## 2017-04-13 NOTE — Tx Team (Signed)
Interdisciplinary Treatment and Diagnostic Plan Update  04/13/2017 Time of Session: 3:15pm Michael Schmitt MRN: [id number]  Principal Diagnosis: Severe recurrent major depression without psychotic features [facility])  Secondary Diagnoses: Principal Problem:   Severe recurrent major depression without psychotic features ([place]) Active Problems:   Combined hyperlipidemia   Coronary artery disease   Chronic Suicidal ideation   Uncontrolled type 2 diabetes mellitus without complication, without long-term current use of insulin (HCC)   Chronic pain syndrome   Generalized anxiety disorder   Current Medications:  Current Facility-Administered Medications  Medication Dose Route Frequency Provider Last Rate Last Dose  . acetaminophen (TYLENOL) tablet 650 mg  650 mg Oral Q6H PRN Clapacs, Madie Reno, MD   650 mg at 04/11/17 1207  . alum & mag hydroxide-simeth (MAALOX/MYLANTA) 200-200-20 MG/5ML suspension 30 mL  30 mL Oral Q4H PRN Clapacs, John T, MD      . atorvastatin (LIPITOR) tablet 80 mg  80 mg Oral q1800 Clapacs, Madie Reno, MD   80 mg at 04/12/17 1843  . carvedilol (COREG) tablet 6.25 mg  6.25 mg Oral BID WC Clapacs, John T, MD   6.25 mg at 04/13/17 1217  . clopidogrel (PLAVIX) tablet 75 mg  75 mg Oral Daily Clapacs, Madie Reno, MD   75 mg at 04/13/17 1217  . dextrose 5 %-0.45 % sodium chloride infusion   Intravenous Continuous Clapacs, John T, MD      . escitalopram (LEXAPRO) tablet 20 mg  20 mg Oral Daily Clapacs, John T, MD   20 mg at 04/13/17 1216  . haloperidol lactate (HALDOL) injection 5 mg  5 mg Intravenous Once Clapacs, John T, MD      . haloperidol lactate (HALDOL) injection 5 mg  5 mg Intravenous Once Clapacs, John T, MD      . haloperidol lactate (HALDOL) injection 5 mg  5 mg Intravenous Once Clapacs, John T, MD      . haloperidol lactate (HALDOL) injection 5 mg  5 mg Intravenous Once Clapacs, John T, MD      . hydrOXYzine (ATARAX/VISTARIL) tablet 50 mg  50 mg Oral TID PRN Clapacs, Madie Reno, MD    50 mg at 04/09/17 1330  . ibuprofen (ADVIL,MOTRIN) tablet 600 mg  600 mg Oral Q8H PRN Clapacs, Madie Reno, MD   600 mg at 04/13/17 1216  . ketorolac (TORADOL) 30 MG/ML injection           . magnesium hydroxide (MILK OF MAGNESIA) suspension 30 mL  30 mL Oral Daily PRN Clapacs, John T, MD      . metFORMIN (GLUCOPHAGE) tablet 500 mg  500 mg Oral BID WC Clapacs, John T, MD   500 mg at 04/13/17 1217  . midazolam (VERSED) injection 4 mg  4 mg Intravenous Once Clapacs, John T, MD      . midazolam (VERSED) injection 4 mg  4 mg Intravenous Once Clapacs, John T, MD      . midazolam (VERSED) injection 4 mg  4 mg Intravenous Once Clapacs, John T, MD      . mirtazapine (REMERON) tablet 45 mg  45 mg Oral QHS Clapacs, John T, MD   45 mg at 04/12/17 2116  . prazosin (MINIPRESS) capsule 2 mg  2 mg Oral QHS Clapacs, Madie Reno, MD   2 mg at 04/12/17 2116  . QUEtiapine (SEROQUEL) tablet 200 mg  200 mg Oral QHS Clapacs, John T, MD   200 mg at 04/12/17 2116  . traZODone (DESYREL) tablet 100 mg  100 mg Oral QHS PRN Clapacs, Madie Reno, MD   100 mg at 04/12/17 2116   PTA Medications: Medications Prior to Admission  Medication Sig Dispense Refill Last Dose  . aspirin EC 81 MG EC tablet Take 1 tablet (81 mg total) by mouth daily. 30 tablet 1 04/10/2017 at Unknown time  . atorvastatin (LIPITOR) 80 MG tablet Take 1 tablet (80 mg total) by mouth daily at 6 PM. 30 tablet 1 04/10/2017 at Unknown time  . carvedilol (COREG) 6.25 MG tablet Take 1 tablet (6.25 mg total) by mouth 2 (two) times daily with a meal. 60 tablet 1 04/10/2017 at Unknown time  . clopidogrel (PLAVIX) 75 MG tablet Take 1 tablet (75 mg total) by mouth daily. 30 tablet 1 04/10/2017 at Unknown time  . escitalopram (LEXAPRO) 20 MG tablet Take 1 tablet (20 mg total) by mouth daily. 30 tablet 1 04/10/2017 at Unknown time  . hydrOXYzine (ATARAX/VISTARIL) 50 MG tablet Take 1 tablet (50 mg total) by mouth 3 (three) times daily as needed for anxiety. 90 tablet 1 04/10/2017 at  Unknown time  . lurasidone 60 MG TABS Take 1 tablet (60 mg total) by mouth daily with breakfast. 30 tablet 1 04/10/2017 at Unknown time  . metFORMIN (GLUCOPHAGE) 500 MG tablet Take 1 tablet (500 mg total) by mouth 2 (two) times daily with a meal. 60 tablet 1 04/10/2017 at Unknown time  . mirtazapine (REMERON) 15 MG tablet Take 1 tablet (15 mg total) by mouth at bedtime. 30 tablet 0 04/10/2017 at Unknown time  . nitroGLYCERIN (NITROSTAT) 0.4 MG SL tablet Place 1 tablet (0.4 mg total) under the tongue every 5 (five) minutes x 3 doses as needed. 10 tablet 0 04/10/2017 at Unknown time  . prazosin (MINIPRESS) 2 MG capsule Take 1 capsule (2 mg total) by mouth at bedtime. 30 capsule 1 04/10/2017 at Unknown time  . traZODone (DESYREL) 100 MG tablet Take 1 tablet (100 mg total) by mouth at bedtime as needed for sleep. 30 tablet 1 04/10/2017 at Unknown time  . zolpidem (AMBIEN) 10 MG tablet Take 1 tablet (10 mg total) by mouth at bedtime. 30 tablet 0 04/10/2017 at Unknown time    Patient Stressors: Legal issue Medication change or noncompliance  Patient Strengths: Average or above average intelligence Communication skills Supportive family/friends  Treatment Modalities: Medication Management, Group therapy, Case management,  1 to 1 session with clinician, Psychoeducation, Recreational therapy.   Physician Treatment Plan for Primary Diagnosis: Severe recurrent major depression without psychotic features ([place]) Long Term Goal(s): Improvement in symptoms so as ready for discharge Improvement in symptoms so as ready for discharge   Short Term Goals: Ability to verbalize feelings will improve Ability to disclose and discuss suicidal ideas Ability to demonstrate self-control will improve Ability to demonstrate self-control will improve Ability to identify and develop effective coping behaviors will improve  Medication Management: Evaluate patient's response, side effects, and tolerance of medication  regimen.  Therapeutic Interventions: 1 to 1 sessions, Unit Group sessions and Medication administration.  Evaluation of Outcomes: Progressing  Physician Treatment Plan for Secondary Diagnosis: Principal Problem:   Severe recurrent major depression without psychotic features ([place]) Active Problems:   Combined hyperlipidemia   Coronary artery disease   Chronic Suicidal ideation   Uncontrolled type 2 diabetes mellitus without complication, without long-term current use of insulin (HCC)   Chronic pain syndrome   Generalized anxiety disorder  Long Term Goal(s): Improvement in symptoms so as ready for discharge Improvement in symptoms so as  ready for discharge   Short Term Goals: Ability to verbalize feelings will improve Ability to disclose and discuss suicidal ideas Ability to demonstrate self-control will improve Ability to demonstrate self-control will improve Ability to identify and develop effective coping behaviors will improve     Medication Management: Evaluate patient's response, side effects, and tolerance of medication regimen.  Therapeutic Interventions: 1 to 1 sessions, Unit Group sessions and Medication administration.  Evaluation of Outcomes: Progressing   RN Treatment Plan for Primary Diagnosis: Severe recurrent major depression without psychotic features ([place]) Long Term Goal(s): Knowledge of disease and therapeutic regimen to maintain health will improve  Short Term Goals: Ability to demonstrate self-control, Ability to participate in decision making will improve, Ability to disclose and discuss suicidal ideas and Ability to identify and develop effective coping behaviors will improve  Medication Management: RN will administer medications as ordered by provider, will assess and evaluate patient's response and provide education to patient for prescribed medication. RN will report any adverse and/or side effects to prescribing provider.  Therapeutic Interventions: 1 on  1 counseling sessions, Psychoeducation, Medication administration, Evaluate responses to treatment, Monitor vital signs and CBGs as ordered, Perform/monitor CIWA, COWS, AIMS and Fall Risk screenings as ordered, Perform wound care treatments as ordered.  Evaluation of Outcomes: Progressing   LCSW Treatment Plan for Primary Diagnosis: Severe recurrent major depression without psychotic features ([place]) Long Term Goal(s): Safe transition to appropriate next level of care at discharge, Engage patient in therapeutic group addressing interpersonal concerns.  Short Term Goals: Engage patient in aftercare planning with referrals and resources, Facilitate patient progression through stages of change regarding substance use diagnoses and concerns and Increase skills for wellness and recovery  Therapeutic Interventions: Assess for all discharge needs, 1 to 1 time with Social worker, Explore available resources and support systems, Assess for adequacy in community support network, Educate family and significant other(s) on suicide prevention, Complete Psychosocial Assessment, Interpersonal group therapy.  Evaluation of Outcomes: Progressing   Progress in Treatment: Attending groups: Yes. Participating in groups: Yes. Taking medication as prescribed: Yes. Toleration medication: Yes. Family/Significant other contact made: No. Attempted to contact sister 2x. Patient understands diagnosis: Yes. Discussing patient identified problems/goals with staff: Yes. Medical problems stabilized or resolved: Yes. Denies suicidal/homicidal ideation: No. Issues/concerns per patient self-inventory: No. Other:  New problem(s) identified: No, Describe:  None  New Short Term/Long Term Goal(s): "I want to get better."  Discharge Plan or Barriers: To return home with sister and follow up with [name] for ECT and CBC for medication management.  Reason for Continuation of Hospitalization: Depression Medication  stabilization Suicidal ideation  Estimated Length of Stay: 3days  Attendees: Patient: Michael Schmitt 04/13/2017 2:44 PM  Physician: Alethia Berthold, MD 04/13/2017 2:44 PM  Nursing:  04/13/2017 2:44 PM  RN Care Manager: 04/13/2017 2:44 PM  Social Worker: Darin Engels, [place] 04/13/2017 2:44 PM  Recreational Therapist: Isaias Sakai. Marcello Fennel, LRT 04/13/2017 2:44 PM  Other:  04/13/2017 2:44 PM  Other:  04/13/2017 2:44 PM  Other: 04/13/2017 2:44 PM    Scribe for Treatment Team: Darin Engels, LCSW 04/13/2017 2:44 PM

## 2017-04-13 NOTE — Procedures (Signed)
ECT SERVICES Physician's Interval Evaluation & Treatment Note  Patient Identification: Michael Schmitt MRN:  [id number] Date of Evaluation:  04/13/2017 TX #: [id number]  MADRS:   MMSE:   P.E. Findings:  No change to physical exam vitals normal.  Psychiatric Interval Note:  Continues to complain of depression although he is less agitated  Subjective:  Patient is a 47 y.o. male seen for evaluation for Electroconvulsive Therapy. See note above  Treatment Summary:   []   Right Unilateral             [x]  Bilateral   % Energy : 1.0 ms 50%   Impedance: 1180 ohms  Seizure Energy Index: 10,476 V squared  Postictal Suppression Index: 49%  Seizure Concordance Index: 98%  Medications  Pre Shock: Toradol 30 mg Brevital 100 mg succinylcholine 150 mg  Post Shock: Versed 4 mg Haldol 5 mg  Seizure Duration: 31 seconds by EMG 54 seconds by EEG   Comments: Follow-up Monday  Lungs:  [x]   Clear to auscultation               []  Other:   Heart:    [x]   Regular rhythm             []  irregular rhythm    [x]   Previous H&P reviewed, patient examined and there are NO CHANGES                 []   Previous H&P reviewed, patient examined and there are changes noted.   Alethia Berthold, MD 1/18/201910:54 AM

## 2017-04-13 NOTE — Progress Notes (Signed)
Recreation Therapy Notes  Date: 01.18.2019  Time: 1:00 PM  Location: Craft Room  Behavioral response: Appropriate   Intervention Topic: Emotions  Discussion/Intervention: Group content on today was focused on emotions. The group identified what emotions are and why it is important to have emotions. Patients expressed some positive and negative emotions. Individuals gave some past experiences on how they normally dealt with emotions in the past. The group described some positive ways to deal with emotions in the future. Patients participated in the intervention "what a day" where individuals were given a chance to respond to certain situations involving their emotions. Clinical Observations/Feedback:  Patient came to group and was focused on what his peers and staff had to say about the topic at hand. Individual participated in the intervention during group.   Bryan Goin LRT/CTRS         Nareg Breighner 04/13/2017 2:46 PM

## 2017-04-13 NOTE — Transfer of Care (Signed)
Immediate Anesthesia Transfer of Care Note  Patient: Michael Schmitt  Procedure(s) Performed: ECT TX  Patient Location: PACU  Anesthesia Type:General  Level of Consciousness: sedated  Airway & Oxygen Therapy: Patient Spontanous Breathing and Patient connected to face mask oxygen  Post-op Assessment: Report given to RN and Post -op Vital signs reviewed and stable  Post vital signs: Reviewed and stable  Last Vitals:  Vitals:   04/13/17 0931 04/13/17 1107  BP: (!) 143/91 (!) 131/91  Pulse: 74 81  Resp:  17  Temp: (!) 36.3 C 37.1 C  SpO2:  93%    Last Pain:  Vitals:   04/13/17 0931  TempSrc: Oral  PainSc: 3          Complications: No apparent anesthesia complications

## 2017-04-13 NOTE — [facility] Suicide Risk Assessment (Signed)
[place] INPATIENT:  Family/Significant Other Suicide Prevention Education  Suicide Prevention Education:  Education Completed; Oseph Imburgia, Sister, [phone] has been identified by the patient as the family member/significant other with whom the patient will be residing, and identified as the person(s) who will aid the patient in the event of a mental health crisis (suicidal ideations/suicide attempt).  With written consent from the patient, the family member/significant other has been provided the following suicide prevention education, prior to the and/or following the discharge of the patient.  The suicide prevention education provided includes the following:  Suicide risk factors  Suicide prevention and interventions  National Suicide Hotline telephone number  [facility] assessment telephone number  [facility] Emergency Assistance [place] and/or Residential Mobile Crisis Unit telephone number  Request made of family/significant other to:  Remove weapons (e.g., guns, rifles, knives), all items previously/currently identified as safety concern.    Remove drugs/medications (over-the-counter, prescriptions, illicit drugs), all items previously/currently identified as a safety concern.  The family member/significant other verbalizes understanding of the suicide prevention education information provided.  The family member/significant other agrees to remove the items of safety concern listed above.  CSW spoke to sister inquiring about any concerns that she has about the patient. She has concerns about his memory loss with him having problems remembering and him telling her that he is not feeling right in his head. She wanted information on and in-home nurse to take care of him. She reports that he does not have any guns/weapons in the home.   Darin Engels 04/13/2017, 2:49 PM

## 2017-04-13 NOTE — Progress Notes (Signed)

## 2017-04-13 NOTE — [facility] Group Notes (Signed)
  04/13/2017  Time: 0930  Type of Therapy and Topic:  Group Therapy:  Feelings around Relapse and Recovery  Participation Level:  Did Not Attend   Description of Group:    Patients in this group will discuss emotions they experience before and after a relapse. They will process how experiencing these feelings, or avoidance of experiencing them, relates to having a relapse. Facilitator will guide patients to explore emotions they have related to recovery. Patients will be encouraged to process which emotions are more powerful. They will be guided to discuss the emotional reaction significant others in their lives may have to their relapse or recovery. Patients will be assisted in exploring ways to respond to the emotions of others without this contributing to a relapse.  Therapeutic Goals: 1. Patient will identify two or more emotions that lead to a relapse for them 2. Patient will identify two emotions that result when they relapse 3. Patient will identify two emotions related to recovery 4. Patient will demonstrate ability to communicate their needs through discussion and/or role plays   Summary of Patient Progress: Pt was invited to attend group but chose not to attend. CSW will continue to encourage pt to attend group throughout their admission.   Therapeutic Modalities:   Cognitive Behavioral Therapy Solution-Focused Therapy Assertiveness Training Relapse Prevention Therapy  Alden Hipp, MSW, LCSW 04/13/2017 10:25 AM

## 2017-04-13 NOTE — Progress Notes (Signed)
[facility] MD Progress Note  04/13/2017 7:34 PM Michael Schmitt  MRN:  [id number] Subjective: Follow-up for 47 year old man with severe recurrent depression currently receiving ECT.  He had ECT this morning and the treatment went off without any difficulty.  Patient when asked directly will still say that he is feeling depressed and has had suicidal thoughts although he does not appear to be as agitated about it.  He certainly has not been shouting out and yelling as much as he was a few days ago.  He is mostly taking care of his grooming all right and is going to groups.  Does not report any psychotic symptoms.  Still pretty passive however.  Probably having a little bit of memory impairment but not confusion from the ECT.  I was asked to call his sister today and attempted to do that but the phone number is not working. Principal Problem: Severe recurrent major depression without psychotic features ([place]) Diagnosis:   Patient Active Problem List   Diagnosis Date Noted  . Severe recurrent major depression without psychotic features ([place]) [F33.2] 03/12/2017    Priority: High  . Generalized anxiety disorder [F41.1] 02/05/2017    Priority: High  . Chronic Suicidal ideation [R45.851] 09/15/2016    Priority: High  . Chronic pain syndrome [G89.4] 11/21/2016    Priority: Medium  . Uncontrolled type 2 diabetes mellitus without complication, without long-term current use of insulin ([place]) [E11.65] 10/19/2016    Priority: Medium  . Coronary artery disease [I25.10] 09/12/2016    Priority: Medium  . Combined hyperlipidemia [E78.2] 01/05/2016    Priority: Low  . Chronic neck pain (Primary Area of Pain) (Left) [M54.2, G89.29] 11/22/2016  . Cervical facet syndrome (Left) [N98.921] 11/22/2016  . Cervical radiculitis (Left) [M54.12] 11/22/2016  . History of cervical spinal surgery [Z98.890] 11/22/2016  . Cervical spondylosis [M47.22] 11/22/2016  . Chronic shoulder pain (Secondary Area of Pain) (Left) [M25.512,  G89.29] 11/22/2016  . 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 Chronic low back pain [facility] Area of Pain) (Bilateral) (L>R) [M54.5, G89.29] 11/22/2016  . Lumbar facet syndrome (Bilateral) (L>R) [M47.816] 11/22/2016  . Long term prescription benzodiazepine use [Z79.899] 11/21/2016  . Opiate use [F11.90] 11/21/2016  . HTN (hypertension) [I10] 06/26/2016  . GERD (gastroesophageal reflux disease) [K21.9] 06/26/2016  . Alcohol use disorder, severe, 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[place]) [F10.21] 06/26/2016  . Moderate benzodiazepine use disorder ([place]) [F13.20] 06/11/2016  . Sedative, hypnotic or anxiolytic use disorder, severe, dependence ([place]) [F13.20] 05/12/2016  . Ventricular fibrillation ([place]) [I49.01] 04/05/2016  . Cervical spondylosis without myelopathy [M47.812] 07/24/2014  . Severe episode of recurrent major depressive disorder, without psychotic features ([place]) [F33.2] 04/15/2013  . Tobacco use disorder [F17.200] 10/20/2010  . Asthma [J45.909] 10/20/2010   Total Time spent with patient: 30 minutes  Past Psychiatric History: Long history of depression multiple hospitalizations.  Past history of suicide attempts.  First attempted ECT  Past Medical History:  Past Medical History:  Diagnosis Date  . Anxiety   . Anxiety   . Arthritis    cerv. stenosis, spondylosis, HNP- lower back , has been followed in pain clinic, has  had injection s in cerv. area  . Blood dyscrasia    told that when he was young he was a" free bleeder"  . CAD (coronary artery disease)   . Cervical spondylosis without myelopathy 07/24/2014  . Cervicogenic headache 07/24/2014  . Chronic kidney disease    renal calculi- passed spontaneously  . Depression   . Diabetes mellitus  without complication ([place])   . Fatty liver   . GERD (gastroesophageal reflux disease)   . Headache(784.0)   . Hyperlipidemia   . Hypertension   . Mental disorder   . MI, old   . RLS (restless legs syndrome)    detected on sleep study   . Shortness of breath   . Ventricular fibrillation ([place]) 04/05/2016    Past Surgical History:  Procedure Laterality Date  . ANTERIOR CERVICAL DECOMP/DISCECTOMY FUSION  11/13/2011   Procedure: ANTERIOR CERVICAL DECOMPRESSION/DISCECTOMY FUSION 1 LEVEL;  Surgeon: [name], MD;  Location: [place] NEUR[facility] ORS;  Service: Neurosurgery;  Laterality: N/A;  Anterior Cervical Decompression/discectomy Fusion. Cervical three-four.  Marland Kitchen CARDIAC CATHETERIZATION N/A 01/01/2015   Procedure: Left Heart Cath and Coronary Angiography;  Surgeon: [name], MD;  Location: [place] CV LAB;  Service: Cardiovascular;  Laterality: N/A;  . CARDIAC CATHETERIZATION N/A 04/05/2016   Procedure: Left Heart Cath and Coronary Angiography;  Surgeon: Belva Crome, MD;  Location: [place] CV LAB;  Service: Cardiovascular;  Laterality: N/A;  . CARDIAC CATHETERIZATION N/A 04/05/2016   Procedure: Coronary Stent Intervention;  Surgeon: Belva Crome, MD;  Location: [place] CV LAB;  Service: Cardiovascular;  Laterality: N/A;  . CARDIAC CATHETERIZATION N/A 04/05/2016   Procedure: Intravascular Ultrasound/IVUS;  Surgeon: Belva Crome, MD;  Location: [place] CV LAB;  Service: Cardiovascular;  Laterality: N/A;  . NASAL SINUS SURGERY     2005   Family History:  Family History  Problem Relation Age of Onset  . Prostate cancer Father   . Hypertension Mother   . Kidney Stones Mother   . Anxiety disorder Mother   . Depression Mother   . COPD Sister   . Hypertension Sister   . Diabetes Sister   . Depression Sister   . Anxiety disorder Sister   . Seizures Sister   . ADD / ADHD Son   . ADD / ADHD Daughter    Family Psychiatric  History: Positive for anxiety and depression Social History:  Social History   Substance and Sexual Activity  Alcohol Use No  . Alcohol/week: 0.0 oz   Comment: last drinked in 3 months.      Social History   Substance and Sexual Activity  Drug Use No    Social History   Socioeconomic  History  . Marital status: Widowed    Spouse name: None  . Number of children: 3  . Years of education: 83  . Highest education level: None  Social Needs  . Financial resource strain: None  . Food insecurity - worry: None  . Food insecurity - inability: None  . Transportation needs - medical: None  . Transportation needs - non-medical: None  Occupational History    Comment: unemployed  Tobacco Use  . Smoking status: Current Every Day Smoker    Packs/day: 2.00    Years: 17.00    Pack years: 34.00    Types: Cigarettes, Cigars  . Smokeless tobacco: Former Systems developer  . Tobacco comment: occassional snuff   Substance and Sexual Activity  . Alcohol use: No    Alcohol/week: 0.0 oz    Comment: last drinked in 3 months.   . Drug use: No  . Sexual activity: Not Currently  Other Topics Concern  . None  Social History Narrative   Patient is right handed.   Patient drinks 2 sodas daily.   Additional Social History:  Sleep: Fair  Appetite:  Good  Current Medications: Current Facility-Administered Medications  Medication Dose Route Frequency Provider Last Rate Last Dose  . acetaminophen (TYLENOL) tablet 650 mg  650 mg Oral Q6H PRN Jaline Pincock, Madie Reno, MD   650 mg at 04/11/17 1207  . alum & mag hydroxide-simeth (MAALOX/MYLANTA) 200-200-20 MG/5ML suspension 30 mL  30 mL Oral Q4H PRN Deacon Gadbois,  T, MD      . atorvastatin (LIPITOR) tablet 80 mg  80 mg Oral q1800 Kaedynce Tapp, Madie Reno, MD   80 mg at 04/13/17 1741  . carvedilol (COREG) tablet 6.25 mg  6.25 mg Oral BID WC Jahlisa Rossitto T, MD   6.25 mg at 04/13/17 1741  . clopidogrel (PLAVIX) tablet 75 mg  75 mg Oral Daily Ayvah Caroll, Madie Reno, MD   75 mg at 04/13/17 1217  . dextrose 5 %-0.45 % sodium chloride infusion   Intravenous Continuous Tearia Gibbs T, MD      . escitalopram (LEXAPRO) tablet 20 mg  20 mg Oral Daily Vonzell Lindblad T, MD   20 mg at 04/13/17 1216  . haloperidol lactate (HALDOL) injection 5 mg  5 mg  Intravenous Once Tiney Zipper T, MD      . haloperidol lactate (HALDOL) injection 5 mg  5 mg Intravenous Once Keiffer Piper T, MD      . haloperidol lactate (HALDOL) injection 5 mg  5 mg Intravenous Once Normon Pettijohn T, MD      . haloperidol lactate (HALDOL) injection 5 mg  5 mg Intravenous Once Tyreanna Bisesi,  T, MD      . hydrOXYzine (ATARAX/VISTARIL) tablet 50 mg  50 mg Oral TID PRN Damonie Ellenwood, Madie Reno, MD   50 mg at 04/09/17 1330  . ibuprofen (ADVIL,MOTRIN) tablet 600 mg  600 mg Oral Q8H PRN Mira Balon, Madie Reno, MD   600 mg at 04/13/17 1216  . ketorolac (TORADOL) 30 MG/ML injection           . magnesium hydroxide (MILK OF MAGNESIA) suspension 30 mL  30 mL Oral Daily PRN Saber Dickerman T, MD      . metFORMIN (GLUCOPHAGE) tablet 500 mg  500 mg Oral BID WC Beckie Viscardi, Madie Reno, MD   500 mg at 04/13/17 1741  . midazolam (VERSED) injection 4 mg  4 mg Intravenous Once Keyah Blizard T, MD      . midazolam (VERSED) injection 4 mg  4 mg Intravenous Once Taji Sather T, MD      . midazolam (VERSED) injection 4 mg  4 mg Intravenous Once Danne Scardina T, MD      . mirtazapine (REMERON) tablet 45 mg  45 mg Oral QHS Amylee Lodato T, MD   45 mg at 04/12/17 2116  . prazosin (MINIPRESS) capsule 2 mg  2 mg Oral QHS , Madie Reno, MD   2 mg at 04/12/17 2116  . QUEtiapine (SEROQUEL) tablet 200 mg  200 mg Oral QHS Sid Greener T, MD   200 mg at 04/12/17 2116  . traZODone (DESYREL) tablet 100 mg  100 mg Oral QHS PRN , Madie Reno, MD   100 mg at 04/12/17 2116    Lab Results:  Results for orders placed or performed during the hospital encounter of 04/04/17 (from the past 48 hour(s))  Glucose, capillary     Status: Abnormal   Collection Time: 04/12/17  7:06 AM  Result Value Ref Range   Glucose-Capillary 153 (H) 65 - 99 mg/dL   Comment 1 Notify RN   Glucose, capillary  Status: Abnormal   Collection Time: 04/13/17  6:47 AM  Result Value Ref Range   Glucose-Capillary 159 (H) 65 - 99 mg/dL  Glucose, capillary      Status: Abnormal   Collection Time: 04/13/17 11:14 AM  Result Value Ref Range   Glucose-Capillary 129 (H) 65 - 99 mg/dL    Blood Alcohol level:  Lab Results  Component Value Date   ETH 37 (H) 03/12/2017   ETH <10 54/27/0623    Metabolic Disorder Labs: Lab Results  Component Value Date   HGBA1C 6.0 (H) 03/13/2017   MPG 126 03/13/2017   MPG 177 09/12/2016   Lab Results  Component Value Date   PROLACTIN 18.1 (H) 09/12/2016   PROLACTIN 6.5 05/27/2016   Lab Results  Component Value Date   CHOL 118 03/13/2017   TRIG 325 (H) 03/13/2017   HDL 31 (L) 03/13/2017   CHOLHDL 3.8 03/13/2017   VLDL 65 (H) 03/13/2017   LDLCALC 22 03/13/2017   LDLCALC 37 09/12/2016    Physical Findings: AIMS: Facial and Oral Movements Muscles of Facial Expression: None, normal Lips and Perioral Area: None, normal Jaw: None, normal Tongue: None, normal,Extremity Movements Upper (arms, wrists, hands, fingers): None, normal Lower (legs, knees, ankles, toes): None, normal, Trunk Movements Neck, shoulders, hips: None, normal, Overall Severity Severity of abnormal movements (highest score from questions above): None, normal Incapacitation due to abnormal movements: None, normal Patient's awareness of abnormal movements (rate only patient's report): No Awareness, Dental Status Current problems with teeth and/or dentures?: No Does patient usually wear dentures?: No  CIWA:    COWS:     Musculoskeletal: Strength & Muscle Tone: within normal limits Gait & Station: normal Patient leans: N/A  Psychiatric Specialty Exam: Physical Exam  Nursing note and vitals reviewed. Constitutional: He appears well-developed and well-nourished.  HENT:  Head: Normocephalic and atraumatic.  Eyes: Conjunctivae are normal. Pupils are equal, round, and reactive to light.  Neck: Normal range of motion.  Cardiovascular: Normal heart sounds.  Respiratory: Effort normal.  GI: Soft.  Musculoskeletal: Normal range of  motion.  Neurological: He is alert.  Skin: Skin is warm and dry.  Psychiatric: His affect is blunt. His speech is delayed. He is slowed. Cognition and memory are impaired. He expresses impulsivity. He expresses suicidal ideation. He expresses no suicidal plans.    Review of Systems  Constitutional: Negative.   HENT: Negative.   Eyes: Negative.   Respiratory: Negative.   Cardiovascular: Negative.   Gastrointestinal: Negative.   Musculoskeletal: Positive for myalgias.  Skin: Negative.   Neurological: Negative.   Psychiatric/Behavioral: Positive for depression, memory loss and suicidal ideas. Negative for hallucinations and substance abuse. The patient is nervous/anxious. The patient does not have insomnia.     Blood pressure 120/72, pulse 73, temperature 98.5 F (36.9 C), temperature source Oral, resp. rate 20, height 6\' 2"  (1.88 m), weight 90.3 kg (199 lb), SpO2 99 %.Body mass index is 25.55 kg/m.  General Appearance: Casual  Eye Contact:  Fair  Speech:  Slow  Volume:  Decreased  Mood:  Dysphoric  Affect:  Congruent  Thought Process:  Goal Directed  Orientation:  Full (Time, Place, and Person)  Thought Content:  Logical and Rumination  Suicidal Thoughts:  Yes.  without intent/plan  Homicidal Thoughts:  No  Memory:  Immediate;   Fair Recent;   Fair Remote;   Fair  Judgement:  Fair  Insight:  Fair  Psychomotor Activity:  Decreased  Concentration:  Concentration: Fair  Recall:  [place] of Knowledge:  Fair  Language:  Fair  Akathisia:  No  Handed:  Right  AIMS (if indicated):     Assets:  Housing Resilience Social Support  ADL's:  Intact  Cognition:  Impaired,  Mild  Sleep:  Number of Hours: 7.15     Treatment Plan Summary: Daily contact with patient to assess and evaluate symptoms and progress in treatment, Medication management and Plan 47 year old man with severe recurrent depression who is currently in the hospital because of his recurrent suicidal ideation and  inability to function at home.  When he came in for this admission he was very agitated crying out labile very demonstrative.  He has calm down a bit.  Still when asked directly will talk about feeling hopeless but is attending some groups.  Blunted affect.  Probably having some cognitive impairment from the ECT.  He is on antidepressant medicine which has been increased and I am hoping that perhaps we can get him out of the hospital by sometime after the weekend.  I called his sister to make contact with her but the telephone number in the chart is not working.  No change to medicine for today.  Alethia Berthold, MD 04/13/2017, 7:34 PM

## 2017-04-13 NOTE — Anesthesia Post-op Follow-up Note (Signed)
Anesthesia QCDR form completed.        

## 2017-04-13 NOTE — Plan of Care (Signed)
  Activity: Interest or engagement in activities will improve 04/13/2017 1525 - Progressing by Rise Mu, RN Note Attending groups.  Visible in the milieu.  Outside playing basketball with peers.     Education: Emotional status will improve 04/13/2017 1525 - Progressing by Rise Mu, RN Note Affect flat.     Coping: Ability to verbalize frustrations and anger appropriately will improve 04/13/2017 1525 - Progressing by Rise Mu, RN Note Pleasant and cooperative.  No outburst    Coping: Ability to verbalize frustrations and anger appropriately will improve 04/13/2017 1525 - Progressing by Rise Mu, RN Note Pleasant and cooperative.  No outburst    Coping: Ability to demonstrate self-control will improve 04/13/2017 1525 - Progressing by Rise Mu, RN Note Pleasant and cooperative   Health Behavior/Discharge Planning: Compliance with therapeutic regimen will improve 04/13/2017 1525 - Progressing by Rise Mu, RN Note Receptive to treatment regiment   Role Relationship: Ability to demonstrate positive changes in social behaviors and relationships will improve 04/13/2017 1525 - Progressing by Rise Mu, RN Note Starting to interact more with peers   Safety: Ability to disclose and discuss suicidal ideas will improve 04/13/2017 1525 - Progressing by Rise Mu, RN Note Denies SI or any thoughts of self harm   Self-Concept: Level of anxiety will decrease 04/13/2017 1525 - Progressing by Rise Mu, RN   Safety: Ability to remain free from injury will improve 04/13/2017 1525 - Progressing by Rise Mu, RN Note Remains safe on the unit

## 2017-04-13 NOTE — H&P (Signed)
Michael Schmitt is an 47 y.o. male.   Chief Complaint: Continues to complain of depression and even suicidal ideation if asked but outwardly seems to be less agitated. HPI: History of recurrent severe depression with minimal response to most treatments.  Recent worsening and decompensation.  Unable to be maintained outside the hospital because of panicky suicidality  Past Medical History:  Diagnosis Date  . Anxiety   . Anxiety   . Arthritis    cerv. stenosis, spondylosis, HNP- lower back , has been followed in pain clinic, has  had injection s in cerv. area  . Blood dyscrasia    told that when he was young he was a" free bleeder"  . CAD (coronary artery disease)   . Cervical spondylosis without myelopathy 07/24/2014  . Cervicogenic headache 07/24/2014  . Chronic kidney disease    renal calculi- passed spontaneously  . Depression   . Diabetes mellitus without complication ([place])   . Fatty liver   . GERD (gastroesophageal reflux disease)   . Headache(784.0)   . Hyperlipidemia   . Hypertension   . Mental disorder   . MI, old   . RLS (restless legs syndrome)    detected on sleep study  . Shortness of breath   . Ventricular fibrillation ([place]) 04/05/2016    Past Surgical History:  Procedure Laterality Date  . ANTERIOR CERVICAL DECOMP/DISCECTOMY FUSION  11/13/2011   Procedure: ANTERIOR CERVICAL DECOMPRESSION/DISCECTOMY FUSION 1 LEVEL;  Surgeon: [name], MD;  Location: [place] NEURO ORS;  Service: Neurosurgery;  Laterality: N/A;  Anterior Cervical Decompression/discectomy Fusion. Cervical three-four.  Marland Kitchen CARDIAC CATHETERIZATION N/A 01/01/2015   Procedure: Left Heart Cath and Coronary Angiography;  Surgeon: [name], MD;  Location: [place] CV LAB;  Service: Cardiovascular;  Laterality: N/A;  . CARDIAC CATHETERIZATION N/A 04/05/2016   Procedure: Left Heart Cath and Coronary Angiography;  Surgeon: Belva Crome, MD;  Location: [place] CV LAB;  Service: Cardiovascular;  Laterality:  N/A;  . CARDIAC CATHETERIZATION N/A 04/05/2016   Procedure: Coronary Stent Intervention;  Surgeon: Belva Crome, MD;  Location: [place] CV LAB;  Service: Cardiovascular;  Laterality: N/A;  . CARDIAC CATHETERIZATION N/A 04/05/2016   Procedure: Intravascular Ultrasound/IVUS;  Surgeon: Belva Crome, MD;  Location: [place] CV LAB;  Service: Cardiovascular;  Laterality: N/A;  . NASAL SINUS SURGERY     2005    Family History  Problem Relation Age of Onset  . Prostate cancer Father   . Hypertension Mother   . Kidney Stones Mother   . Anxiety disorder Mother   . Depression Mother   . COPD Sister   . Hypertension Sister   . Diabetes Sister   . Depression Sister   . Anxiety disorder Sister   . Seizures Sister   . ADD / ADHD Son   . ADD / ADHD Daughter    Social History:  reports that he has been smoking cigarettes and cigars.  He has a 34.00 pack-year smoking history. He has quit using smokeless tobacco. He reports that he does not drink alcohol or use drugs.  Allergies:  Allergies  Allergen Reactions  . Prednisone Other (See Comments)    Hypertension, makes him feel spacey  . Hydrocodone Other (See Comments)    Headache, irritable  . Varenicline Other (See Comments)    Suicidal thoughts    Medications Prior to Admission  Medication Sig Dispense Refill  . aspirin EC 81 MG EC tablet Take 1 tablet (81 mg total) by  mouth daily. 30 tablet 1  . atorvastatin (LIPITOR) 80 MG tablet Take 1 tablet (80 mg total) by mouth daily at 6 PM. 30 tablet 1  . carvedilol (COREG) 6.25 MG tablet Take 1 tablet (6.25 mg total) by mouth 2 (two) times daily with a meal. 60 tablet 1  . clopidogrel (PLAVIX) 75 MG tablet Take 1 tablet (75 mg total) by mouth daily. 30 tablet 1  . escitalopram (LEXAPRO) 20 MG tablet Take 1 tablet (20 mg total) by mouth daily. 30 tablet 1  . hydrOXYzine (ATARAX/VISTARIL) 50 MG tablet Take 1 tablet (50 mg total) by mouth 3 (three) times daily as needed for anxiety. 90  tablet 1  . lurasidone 60 MG TABS Take 1 tablet (60 mg total) by mouth daily with breakfast. 30 tablet 1  . metFORMIN (GLUCOPHAGE) 500 MG tablet Take 1 tablet (500 mg total) by mouth 2 (two) times daily with a meal. 60 tablet 1  . mirtazapine (REMERON) 15 MG tablet Take 1 tablet (15 mg total) by mouth at bedtime. 30 tablet 0  . nitroGLYCERIN (NITROSTAT) 0.4 MG SL tablet Place 1 tablet (0.4 mg total) under the tongue every 5 (five) minutes x 3 doses as needed. 10 tablet 0  . prazosin (MINIPRESS) 2 MG capsule Take 1 capsule (2 mg total) by mouth at bedtime. 30 capsule 1  . traZODone (DESYREL) 100 MG tablet Take 1 tablet (100 mg total) by mouth at bedtime as needed for sleep. 30 tablet 1  . zolpidem (AMBIEN) 10 MG tablet Take 1 tablet (10 mg total) by mouth at bedtime. 30 tablet 0    Results for orders placed or performed during the hospital encounter of 04/04/17 (from the past 48 hour(s))  Glucose, capillary     Status: Abnormal   Collection Time: 04/12/17  7:06 AM  Result Value Ref Range   Glucose-Capillary 153 (H) 65 - 99 mg/dL   Comment 1 Notify RN   Glucose, capillary     Status: Abnormal   Collection Time: 04/13/17  6:47 AM  Result Value Ref Range   Glucose-Capillary 159 (H) 65 - 99 mg/dL   No results found.  Review of Systems  Constitutional: Negative.   HENT: Negative.   Eyes: Negative.   Respiratory: Negative.   Cardiovascular: Negative.   Gastrointestinal: Negative.   Musculoskeletal: Negative.   Skin: Negative.   Neurological: Negative.   Psychiatric/Behavioral: Positive for depression and suicidal ideas. Negative for hallucinations, memory loss and substance abuse. The patient is not nervous/anxious and does not have insomnia.     Blood pressure (!) 143/91, pulse 74, temperature (!) 97.3 F (36.3 C), temperature source Oral, resp. rate 18, height 6\' 2"  (1.88 m), weight 90.3 kg (199 lb), SpO2 99 %. Physical Exam  Nursing note and vitals reviewed. Constitutional: He  appears well-developed and well-nourished.  HENT:  Head: Normocephalic and atraumatic.  Eyes: Conjunctivae are normal. Pupils are equal, round, and reactive to light.  Neck: Normal range of motion.  Cardiovascular: Regular rhythm and normal heart sounds.  Respiratory: Effort normal. No respiratory distress.  GI: Soft.  Musculoskeletal: Normal range of motion.  Neurological: He is alert.  Skin: Skin is warm and dry.  Psychiatric: Judgment normal. His affect is blunt. His speech is delayed. He is slowed. He exhibits a depressed mood. He expresses suicidal ideation. He expresses no suicidal plans. He exhibits abnormal recent memory.     Assessment/Plan Treatment today follow-up Monday.  I am hoping that we can get him into  a stable enough condition that we can get him discharged soon even if we follow up with further ECT.  Alethia Berthold, MD 04/13/2017, 10:53 AM

## 2017-04-14 NOTE — Plan of Care (Signed)
Patient remains safe on the unit. Continues to pace the unit periodically letting out a yell, patient encouraged not to yell on the unit due to potentially scaring his peers. Compliant with medication regimen, complained of some neck pain this morning, prn  IBP administered with some relief. Denies SI but reports some depression. Milieu remains safe with q 15 minute safety checks.

## 2017-04-14 NOTE — Progress Notes (Signed)
[facility] MD Progress Note  04/14/2017 1:58 PM Michael Schmitt  MRN:  [id number] Subjective:   Pt with history of recurrent severe depression with minimal response to most treatments.  Recent worsening and decompensation, SI. Pt continue to endorse depression, denies SI. Pt reportedly isolative, agitates at times, screaming out loud, Next ECT on Monday   Principal Problem: Severe recurrent major depression without psychotic features [facility]) Diagnosis:   Patient Active Problem List   Diagnosis Date Noted  . Severe recurrent major depression without psychotic features ([place]) [F33.2] 03/12/2017  . Generalized anxiety disorder [F41.1] 02/05/2017  . Chronic neck pain (Primary Area of Pain) (Left) [M54.2, G89.29] 11/22/2016  . Cervical facet syndrome (Left) [A25.053] 11/22/2016  . Cervical radiculitis (Left) [M54.12] 11/22/2016  . History of cervical spinal surgery [Z98.890] 11/22/2016  . Cervical spondylosis [M47.22] 11/22/2016  . Chronic shoulder pain (Secondary Area of Pain) (Left) [M25.512, G89.29] 11/22/2016  . 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 Chronic low back pain [facility] Area of Pain) (Bilateral) (L>R) [M54.5, G89.29] 11/22/2016  . Lumbar facet syndrome (Bilateral) (L>R) [M47.816] 11/22/2016  . Chronic pain syndrome [G89.4] 11/21/2016  . Long term prescription benzodiazepine use [Z79.899] 11/21/2016  . Opiate use [F11.90] 11/21/2016  . Uncontrolled type 2 diabetes mellitus without complication, without long-term current use of insulin ([place]) [E11.65] 10/19/2016  . Chronic Suicidal ideation [R45.851] 09/15/2016  . Coronary artery disease [I25.10] 09/12/2016  . HTN (hypertension) [I10] 06/26/2016  . GERD (gastroesophageal reflux disease) [K21.9] 06/26/2016  . Alcohol use disorder, severe, 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[place]) [F10.21] 06/26/2016  . Moderate benzodiazepine use disorder ([place]) [F13.20] 06/11/2016  . Sedative, hypnotic or anxiolytic use disorder, severe, dependence  ([place]) [F13.20] 05/12/2016  . Ventricular fibrillation ([place]) [I49.01] 04/05/2016  . 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 Cervical spondylosis without myelopathy [M47.812] 07/24/2014  . Severe episode of recurrent major depressive disorder, without psychotic features ([place]) [F33.2] 04/15/2013  . Tobacco use disorder [F17.200] 10/20/2010  . Asthma [J45.909] 10/20/2010   Total Time spent with patient: 25 min  Past Psychiatric History: no new info  Past Medical History:  Past Medical History:  Diagnosis Date  . Anxiety   . Anxiety   . Arthritis    cerv. stenosis, spondylosis, HNP- lower back , has been followed in pain clinic, has  had injection s in cerv. area  . Blood dyscrasia    told that when he was young he was a" free bleeder"  . CAD (coronary artery disease)   . Cervical spondylosis without myelopathy 07/24/2014  . Cervicogenic headache 07/24/2014  . Chronic kidney disease    renal calculi- passed spontaneously  . Depression   . Diabetes mellitus without complication ([place])   . Fatty liver   . GERD (gastroesophageal reflux disease)   . Headache(784.0)   . Hyperlipidemia   . Hypertension   . Mental disorder   . MI, old   . RLS (restless legs syndrome)    detected on sleep study  . Shortness of breath   . Ventricular fibrillation ([place]) 04/05/2016    Past Surgical History:  Procedure Laterality Date  . ANTERIOR CERVICAL DECOMP/DISCECTOMY FUSION  11/13/2011   Procedure: ANTERIOR CERVICAL DECOMPRESSION/DISCECTOMY FUSION 1 LEVEL;  Surgeon: [name], MD;  Location: [facility];  Service: Neurosurgery;  Laterality: N/A;  Anterior Cervical Decompression/discectomy Fusion. Cervical three-four.  Marland Kitchen CARDIAC CATHETERIZATION N/A 01/01/2015   Procedure: Left Heart Cath and Coronary Angiography;  Surgeon: [name], MD;  Location: [facility];  Service:  Cardiovascular;  Laterality: N/A;  . CARDIAC CATHETERIZATION N/A 04/05/2016   Procedure: Left Heart Cath and  Coronary Angiography;  Surgeon: Belva Crome, MD;  Location: [facility];  Service: Cardiovascular;  Laterality: N/A;  . CARDIAC CATHETERIZATION N/A 04/05/2016   Procedure: Coronary Stent Intervention;  Surgeon: Belva Crome, MD;  Location: [place] [facility];  Service: Cardiovascular;  Laterality: N/A;  . CARDIAC CATHETERIZATION N/A 04/05/2016   Procedure: Intravascular Ultrasound/IVUS;  Surgeon: Belva Crome, MD;  Location: [facility];  Service: Cardiovascular;  Laterality: N/A;  . NASAL SINUS SURGERY     2005   Family History:  Family History  Problem Relation Age of Onset  . Prostate cancer Father   . Hypertension Mother   . Kidney Stones Mother   . Anxiety disorder Mother   . Depression Mother   . COPD Sister   . Hypertension Sister   . Diabetes Sister   . Depression Sister   . Anxiety disorder Sister   . Seizures Sister   . ADD / ADHD Son   . ADD / ADHD Daughter    Family Psychiatric  History: no new info Social History:  Social History   Substance and Sexual Activity  Alcohol Use No  . Alcohol/week: 0.0 oz   Comment: last drinked in 3 months.      Social History   Substance and Sexual Activity  Drug Use No    Social History   Socioeconomic History  . Marital status: Widowed    Spouse name: None  . Number of children: 3  . Years of education: 51  . Highest education level: None  Social Needs  . Financial resource strain: None  . Food insecurity - worry: None  . Food insecurity - inability: None  . Transportation needs - medical: None  . Transportation needs - non-medical: None  Occupational History    Comment: unemployed  Tobacco Use  . Smoking status: Current Every Day Smoker    Packs/day: 2.00    Years: 17.00    Pack years: 34.00    Types: Cigarettes, Cigars  . Smokeless tobacco: Former Systems developer  . Tobacco comment: occassional snuff   Substance and Sexual Activity  . Alcohol use: No    Alcohol/week: 0.0 oz    Comment: last drinked  in 3 months.   . Drug use: No  . Sexual activity: Not Currently  Other Topics Concern  . None  Social History Narrative   Patient is right handed.   Patient drinks 2 sodas daily.   Additional Social History:                         Sleep: Fair  Appetite:  Fair  Current Medications: Current Facility-Administered Medications  Medication Dose Route Frequency Provider Last Rate Last Dose  . acetaminophen (TYLENOL) tablet 650 mg  650 mg Oral Q6H PRN Clapacs, Madie Reno, MD   650 mg at 04/13/17 2122  . alum & mag hydroxide-simeth (MAALOX/MYLANTA) 200-200-20 MG/5ML suspension 30 mL  30 mL Oral Q4H PRN Clapacs, John T, MD      . atorvastatin (LIPITOR) tablet 80 mg  80 mg Oral q1800 Clapacs, Madie Reno, MD   80 mg at 04/13/17 1741  . carvedilol (COREG) tablet 6.25 mg  6.25 mg Oral BID WC Clapacs, Madie Reno, MD   6.25 mg at 04/14/17 0828  . clopidogrel (PLAVIX) tablet 75 mg  75 mg Oral Daily Clapacs, John  T, MD   75 mg at 04/14/17 0828  . dextrose 5 %-0.45 % sodium chloride infusion   Intravenous Continuous Clapacs, John T, MD      . escitalopram (LEXAPRO) tablet 20 mg  20 mg Oral Daily Clapacs, Madie Reno, MD   20 mg at 04/14/17 3244  . haloperidol lactate (HALDOL) injection 5 mg  5 mg Intravenous Once Clapacs, John T, MD      . haloperidol lactate (HALDOL) injection 5 mg  5 mg Intravenous Once Clapacs, John T, MD      . haloperidol lactate (HALDOL) injection 5 mg  5 mg Intravenous Once Clapacs, John T, MD      . haloperidol lactate (HALDOL) injection 5 mg  5 mg Intravenous Once Clapacs, John T, MD      . hydrOXYzine (ATARAX/VISTARIL) tablet 50 mg  50 mg Oral TID PRN Clapacs, Madie Reno, MD   50 mg at 04/13/17 2122  . ibuprofen (ADVIL,MOTRIN) tablet 600 mg  600 mg Oral Q8H PRN Clapacs, John T, MD   600 mg at 04/14/17 0830  . magnesium hydroxide (MILK OF MAGNESIA) suspension 30 mL  30 mL Oral Daily PRN Clapacs, John T, MD      . metFORMIN (GLUCOPHAGE) tablet 500 mg  500 mg Oral BID WC Clapacs, Madie Reno,  MD   500 mg at 04/14/17 0828  . midazolam (VERSED) injection 4 mg  4 mg Intravenous Once Clapacs, John T, MD      . midazolam (VERSED) injection 4 mg  4 mg Intravenous Once Clapacs, John T, MD      . midazolam (VERSED) injection 4 mg  4 mg Intravenous Once Clapacs, John T, MD      . mirtazapine (REMERON) tablet 45 mg  45 mg Oral QHS Clapacs, Madie Reno, MD   45 mg at 04/13/17 2122  . prazosin (MINIPRESS) capsule 2 mg  2 mg Oral QHS Clapacs, Madie Reno, MD   2 mg at 04/13/17 2122  . QUEtiapine (SEROQUEL) tablet 300 mg  300 mg Oral QHS Clapacs, Madie Reno, MD   300 mg at 04/13/17 2122  . traZODone (DESYREL) tablet 100 mg  100 mg Oral QHS PRN Clapacs, Madie Reno, MD   100 mg at 04/13/17 2122    Lab Results:  Results for orders placed or performed during the hospital encounter of 04/04/17 (from the past 48 hour(s))  Glucose, capillary     Status: Abnormal   Collection Time: 04/13/17  6:47 AM  Result Value Ref Range   Glucose-Capillary 159 (H) 65 - 99 mg/dL  Glucose, capillary     Status: Abnormal   Collection Time: 04/13/17 11:14 AM  Result Value Ref Range   Glucose-Capillary 129 (H) 65 - 99 mg/dL    Blood Alcohol level:  Lab Results  Component Value Date   ETH 37 (H) 03/12/2017   ETH <10 03/29/7251    Metabolic Disorder Labs: Lab Results  Component Value Date   HGBA1C 6.0 (H) 03/13/2017   MPG 126 03/13/2017   MPG 177 09/12/2016   Lab Results  Component Value Date   PROLACTIN 18.1 (H) 09/12/2016   PROLACTIN 6.5 05/27/2016   Lab Results  Component Value Date   CHOL 118 03/13/2017   TRIG 325 (H) 03/13/2017   HDL 31 (L) 03/13/2017   CHOLHDL 3.8 03/13/2017   VLDL 65 (H) 03/13/2017   LDLCALC 22 03/13/2017   LDLCALC 37 09/12/2016    Physical Findings: AIMS: Facial and Oral Movements Muscles of  Facial Expression: None, normal Lips and Perioral Area: None, normal Jaw: None, normal Tongue: None, normal,Extremity Movements Upper (arms, wrists, hands, fingers): None, normal Lower (legs,  knees, ankles, toes): None, normal, Trunk Movements Neck, shoulders, hips: None, normal, Overall Severity Severity of abnormal movements (highest score from questions above): None, normal Incapacitation due to abnormal movements: None, normal Patient's awareness of abnormal movements (rate only patient's report): No Awareness, Dental Status Current problems with teeth and/or dentures?: No Does patient usually wear dentures?: No  CIWA:    COWS:     Musculoskeletal: Strength & Muscle Tone: within normal limits Gait & Station: normal Patient leans:   Psychiatric Specialty Exam: Physical Exam  Nursing note and vitals reviewed.   ROS  Blood pressure 136/72, pulse 81, temperature 98.5 F (36.9 C), temperature source Oral, resp. rate 18, height 6\' 2"  (1.88 m), weight 90.3 kg (199 lb), SpO2 99 %.Body mass index is 25.55 kg/m.  General Appearance: Casual  Eye Contact:  Fair  Speech:  Slow  Volume:  Decreased  Mood:  Anxious, Depressed and Dysphoric  Affect:  Congruent  Thought Process:  Coherent  Orientation:  Full (Time, Place, and Person)  Thought Content:  Rumination , depressive   Suicidal Thoughts:  denies  Homicidal Thoughts:  No  Memory:  Immediate;   Fair Recent;   Fair Remote;   Fair  Judgement:  Impaired  Insight:  Shallow  Psychomotor Activity:  Decreased  Concentration:  Concentration: Fair  Recall:  [employer] of Knowledge:  Fair  Language:  Fair  Akathisia:  No  Handed:  Right  AIMS (if indicated):     Assets:  Desire for Improvement Housing Social Support  ADL's:  Intact  Cognition:  Impaired,  Mild   Sleep-7.15 hrs     Treatment Plan Summary: Pt depressed. Next ECT on Monday. Cont lexapro, remeron, seroquel, prazosin.   Lenward Chancellor, MD 04/14/2017, 1:58 PM

## 2017-04-14 NOTE — Progress Notes (Signed)
Patient remains safe on the unit. Continues to pace the unit periodically letting out a yell, patient encouraged not to yell on the unit due to potentially scaring his peers. Compliant with medication regimen, complained of some neck pain this morning, prn  IBP administered with some relief. Denies SI but reports some depression. Milieu remains safe with q 15 minute safety checks.

## 2017-04-14 NOTE — [facility] Group Notes (Signed)
LCSW Group Therapy Note   04/14/2017 1:15pm   Type of Therapy and Topic:  Group Therapy:  Trust and Honesty  Participation Level:  None  Description of Group:    In this group patients will be asked to explore the value of being honest.  Patients will be guided to discuss their thoughts, feelings, and behaviors related to honesty and trusting in others. Patients will process together how trust and honesty relate to forming relationships with peers, family members, and self. Each patient will be challenged to identify and express feelings of being vulnerable. Patients will discuss reasons why people are dishonest and identify alternative outcomes if one was truthful (to self or others). This group will be process-oriented, with patients participating in exploration of their own experiences, giving and receiving support, and processing challenge from other group members.   Therapeutic Goals: 1. Patient will identify why honesty is important to relationships and how honesty overall affects relationships.  2. Patient will identify a situation where they lied or were lied too and the  feelings, thought process, and behaviors surrounding the situation 3. Patient will identify the meaning of being vulnerable, how that feels, and how that correlates to being honest with self and others. 4. Patient will identify situations where they could have told the truth, but instead lied and explain reasons of dishonesty.   Summary of Patient Progress: Pt attended group and did not participate.     Therapeutic Modalities:   Cognitive Behavioral Therapy Solution Focused Therapy Motivational Interviewing Brief Therapy  Alya Smaltz  CUEBAS-COLON, LCSW 04/14/2017 12:58 PM

## 2017-04-14 NOTE — Plan of Care (Signed)
  Progressing Activity: Interest or engagement in activities will improve 04/14/2017 2000 - Progressing by Clemens Catholic, RN Education: Emotional status will improve 04/14/2017 2000 - Progressing by Clemens Catholic, RN Coping: Ability to verbalize frustrations and anger appropriately will improve 04/14/2017 2000 - Progressing by Clemens Catholic, RN Health Behavior/Discharge Planning: Ability to make decisions will improve 04/14/2017 2000 - Progressing by Clemens Catholic, RN Safety: Ability to disclose and discuss suicidal ideas will improve 04/14/2017 2000 - Progressing by Clemens Catholic, RN Self-Concept: Level of anxiety will decrease 04/14/2017 2000 - Progressing by Clemens Catholic, RN Safety: Ability to remain free from injury will improve 04/14/2017 2000 - Progressing by Clemens Catholic, RN

## 2017-04-15 ENCOUNTER — Other Ambulatory Visit: Payer: Self-pay | Admitting: Psychiatry

## 2017-04-15 LAB — GLUCOSE, CAPILLARY: GLUCOSE-CAPILLARY: 140 mg/dL — AB (ref 65–99)

## 2017-04-15 NOTE — Progress Notes (Signed)
[facility] MD Progress Note  04/15/2017 1:48 PM Michael Schmitt  MRN:  [id number] Subjective:   Pt with history of recurrent severe depression with minimal response to most treatments.  Recent worsening and decompensation, SI. Pt continue to endorse depression, hopelessness, feeling " not good", denies SI. Pt reportedly isolative, agitates at times, screaming out loud, Next ECT on Monday   Principal Problem: Severe recurrent major depression without psychotic features [facility]) Diagnosis:   Patient Active Problem List   Diagnosis Date Noted  . Severe recurrent major depression without psychotic features ([place]) [F33.2] 03/12/2017  . Generalized anxiety disorder [F41.1] 02/05/2017  . Chronic neck pain (Primary Area of Pain) (Left) [M54.2, G89.29] 11/22/2016  . Cervical facet syndrome (Left) [K93.267] 11/22/2016  . Cervical radiculitis (Left) [M54.12] 11/22/2016  . History of cervical spinal surgery [Z98.890] 11/22/2016  . Cervical spondylosis [M47.22] 11/22/2016  . Chronic shoulder pain (Secondary Area of Pain) (Left) [M25.512, G89.29] 11/22/2016  . 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 Chronic low back pain [facility] Area of Pain) (Bilateral) (L>R) [M54.5, G89.29] 11/22/2016  . Lumbar facet syndrome (Bilateral) (L>R) [M47.816] 11/22/2016  . Chronic pain syndrome [G89.4] 11/21/2016  . Long term prescription benzodiazepine use [Z79.899] 11/21/2016  . Opiate use [F11.90] 11/21/2016  . Uncontrolled type 2 diabetes mellitus without complication, without long-term current use of insulin ([place]) [E11.65] 10/19/2016  . Chronic Suicidal ideation [R45.851] 09/15/2016  . Coronary artery disease [I25.10] 09/12/2016  . HTN (hypertension) [I10] 06/26/2016  . GERD (gastroesophageal reflux disease) [K21.9] 06/26/2016  . Alcohol use disorder, severe, 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[place]) [F10.21] 06/26/2016  . Moderate benzodiazepine use disorder ([place]) [F13.20] 06/11/2016  . Sedative, hypnotic or anxiolytic  use disorder, severe, dependence ([place]) [F13.20] 05/12/2016  . Ventricular fibrillation ([place]) [I49.01] 04/05/2016  . 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 Cervical spondylosis without myelopathy [M47.812] 07/24/2014  . Severe episode of recurrent major depressive disorder, without psychotic features ([place]) [F33.2] 04/15/2013  . Tobacco use disorder [F17.200] 10/20/2010  . Asthma [J45.909] 10/20/2010   Total Time spent with patient: 25 min  Past Psychiatric History: no new info  Past Medical History:  Past Medical History:  Diagnosis Date  . Anxiety   . Anxiety   . Arthritis    cerv. stenosis, spondylosis, HNP- lower back , has been followed in pain clinic, has  had injection s in cerv. area  . Blood dyscrasia    told that when he was young he was a" free bleeder"  . CAD (coronary artery disease)   . Cervical spondylosis without myelopathy 07/24/2014  . Cervicogenic headache 07/24/2014  . Chronic kidney disease    renal calculi- passed spontaneously  . Depression   . Diabetes mellitus without complication ([place])   . Fatty liver   . GERD (gastroesophageal reflux disease)   . Headache(784.0)   . Hyperlipidemia   . Hypertension   . Mental disorder   . MI, old   . RLS (restless legs syndrome)    detected on sleep study  . Shortness of breath   . Ventricular fibrillation ([place]) 04/05/2016    Past Surgical History:  Procedure Laterality Date  . ANTERIOR CERVICAL DECOMP/DISCECTOMY FUSION  11/13/2011   Procedure: ANTERIOR CERVICAL DECOMPRESSION/DISCECTOMY FUSION 1 LEVEL;  Surgeon: [name], MD;  Location: [place] NEURO ORS;  Service: Neurosurgery;  Laterality: N/A;  Anterior Cervical Decompression/discectomy Fusion. Cervical three-four.  Marland Kitchen CARDIAC CATHETERIZATION N/A 01/01/2015   Procedure: Left Heart Cath and Coronary Angiography;  Surgeon: [name], MD;  Location: [facility]  INVASIVE CV LAB;  Service: Cardiovascular;  Laterality: N/A;  . CARDIAC CATHETERIZATION N/A 04/05/2016    Procedure: Left Heart Cath and Coronary Angiography;  Surgeon: Belva Crome, MD;  Location: [place] CV LAB;  Service: Cardiovascular;  Laterality: N/A;  . CARDIAC CATHETERIZATION N/A 04/05/2016   Procedure: Coronary Stent Intervention;  Surgeon: Belva Crome, MD;  Location: [place] CV LAB;  Service: Cardiovascular;  Laterality: N/A;  . CARDIAC CATHETERIZATION N/A 04/05/2016   Procedure: Intravascular Ultrasound/IVUS;  Surgeon: Belva Crome, MD;  Location: [place] CV LAB;  Service: Cardiovascular;  Laterality: N/A;  . NASAL SINUS SURGERY     2005   Family History:  Family History  Problem Relation Age of Onset  . Prostate cancer Father   . Hypertension Mother   . Kidney Stones Mother   . Anxiety disorder Mother   . Depression Mother   . COPD Sister   . Hypertension Sister   . Diabetes Sister   . Depression Sister   . Anxiety disorder Sister   . Seizures Sister   . ADD / ADHD Son   . ADD / ADHD Daughter    Family Psychiatric  History: no new info Social History:  Social History   Substance and Sexual Activity  Alcohol Use No  . Alcohol/week: 0.0 oz   Comment: last drinked in 3 months.      Social History   Substance and Sexual Activity  Drug Use No    Social History   Socioeconomic History  . Marital status: Widowed    Spouse name: None  . Number of children: 3  . Years of education: 58  . Highest education level: None  Social Needs  . Financial resource strain: None  . Food insecurity - worry: None  . Food insecurity - inability: None  . Transportation needs - medical: None  . Transportation needs - non-medical: None  Occupational History    Comment: unemployed  Tobacco Use  . Smoking status: Current Every Day Smoker    Packs/day: 2.00    Years: 17.00    Pack years: 34.00    Types: Cigarettes, Cigars  . Smokeless tobacco: Former Systems developer  . Tobacco comment: occassional snuff   Substance and Sexual Activity  . Alcohol use: No    Alcohol/week:  0.0 oz    Comment: last drinked in 3 months.   . Drug use: No  . Sexual activity: Not Currently  Other Topics Concern  . None  Social History Narrative   Patient is right handed.   Patient drinks 2 sodas daily.   Additional Social History:                         Sleep: Fair  Appetite:  Fair  Current Medications: Current Facility-Administered Medications  Medication Dose Route Frequency Provider Last Rate Last Dose  . acetaminophen (TYLENOL) tablet 650 mg  650 mg Oral Q6H PRN Clapacs, Madie Reno, MD   650 mg at 04/13/17 2122  . alum & mag hydroxide-simeth (MAALOX/MYLANTA) 200-200-20 MG/5ML suspension 30 mL  30 mL Oral Q4H PRN Clapacs, John T, MD      . atorvastatin (LIPITOR) tablet 80 mg  80 mg Oral q1800 Clapacs, Madie Reno, MD   80 mg at 04/14/17 1720  . carvedilol (COREG) tablet 6.25 mg  6.25 mg Oral BID WC Clapacs, Madie Reno, MD   6.25 mg at 04/15/17 0928  . clopidogrel (PLAVIX) tablet 75 mg  75  mg Oral Daily Clapacs, Madie Reno, MD   75 mg at 04/15/17 0934  . dextrose 5 %-0.45 % sodium chloride infusion   Intravenous Continuous Clapacs, John T, MD      . escitalopram (LEXAPRO) tablet 20 mg  20 mg Oral Daily Clapacs, Madie Reno, MD   20 mg at 04/15/17 8413  . haloperidol lactate (HALDOL) injection 5 mg  5 mg Intravenous Once Clapacs, John T, MD      . haloperidol lactate (HALDOL) injection 5 mg  5 mg Intravenous Once Clapacs, John T, MD      . haloperidol lactate (HALDOL) injection 5 mg  5 mg Intravenous Once Clapacs, John T, MD      . haloperidol lactate (HALDOL) injection 5 mg  5 mg Intravenous Once Clapacs, John T, MD      . hydrOXYzine (ATARAX/VISTARIL) tablet 50 mg  50 mg Oral TID PRN Clapacs, Madie Reno, MD   50 mg at 04/13/17 2122  . ibuprofen (ADVIL,MOTRIN) tablet 600 mg  600 mg Oral Q8H PRN Clapacs, John T, MD   600 mg at 04/14/17 0830  . magnesium hydroxide (MILK OF MAGNESIA) suspension 30 mL  30 mL Oral Daily PRN Clapacs, John T, MD      . metFORMIN (GLUCOPHAGE) tablet 500 mg  500  mg Oral BID WC Clapacs, Madie Reno, MD   500 mg at 04/15/17 0929  . midazolam (VERSED) injection 4 mg  4 mg Intravenous Once Clapacs, John T, MD      . midazolam (VERSED) injection 4 mg  4 mg Intravenous Once Clapacs, John T, MD      . midazolam (VERSED) injection 4 mg  4 mg Intravenous Once Clapacs, John T, MD      . mirtazapine (REMERON) tablet 45 mg  45 mg Oral QHS Clapacs, Madie Reno, MD   45 mg at 04/14/17 2146  . prazosin (MINIPRESS) capsule 2 mg  2 mg Oral QHS Clapacs, Madie Reno, MD   2 mg at 04/14/17 2147  . QUEtiapine (SEROQUEL) tablet 300 mg  300 mg Oral QHS Clapacs, John T, MD   300 mg at 04/14/17 2147  . traZODone (DESYREL) tablet 100 mg  100 mg Oral QHS PRN Clapacs, Madie Reno, MD   100 mg at 04/14/17 2146    Lab Results:  Results for orders placed or performed during the hospital encounter of 04/04/17 (from the past 48 hour(s))  Glucose, capillary     Status: Abnormal   Collection Time: 04/15/17  7:07 AM  Result Value Ref Range   Glucose-Capillary 140 (H) 65 - 99 mg/dL    Blood Alcohol level:  Lab Results  Component Value Date   ETH 37 (H) 03/12/2017   ETH <10 24/40/1027    Metabolic Disorder Labs: Lab Results  Component Value Date   HGBA1C 6.0 (H) 03/13/2017   MPG 126 03/13/2017   MPG 177 09/12/2016   Lab Results  Component Value Date   PROLACTIN 18.1 (H) 09/12/2016   PROLACTIN 6.5 05/27/2016   Lab Results  Component Value Date   CHOL 118 03/13/2017   TRIG 325 (H) 03/13/2017   HDL 31 (L) 03/13/2017   CHOLHDL 3.8 03/13/2017   VLDL 65 (H) 03/13/2017   LDLCALC 22 03/13/2017   LDLCALC 37 09/12/2016    Physical Findings: AIMS: Facial and Oral Movements Muscles of Facial Expression: None, normal Lips and Perioral Area: None, normal Jaw: None, normal Tongue: None, normal,Extremity Movements Upper (arms, wrists, hands, fingers): None, normal Lower (  legs, knees, ankles, toes): None, normal, Trunk Movements Neck, shoulders, hips: None, normal, Overall Severity Severity  of abnormal movements (highest score from questions above): None, normal Incapacitation due to abnormal movements: None, normal Patient's awareness of abnormal movements (rate only patient's report): No Awareness, Dental Status Current problems with teeth and/or dentures?: No Does patient usually wear dentures?: No  CIWA:    COWS:     Musculoskeletal: Strength & Muscle Tone: within normal limits Gait & Station: normal Patient leans:   Psychiatric Specialty Exam: Physical Exam  Nursing note and vitals reviewed.   ROS  Blood pressure 124/79, pulse 83, temperature (!) 97.4 F (36.3 C), temperature source Oral, resp. rate 18, height 6\' 2"  (1.88 m), weight 90.3 kg (199 lb), SpO2 99 %.Body mass index is 25.55 kg/m.  General Appearance: Casual  Eye Contact:  Fair  Speech:  Slow  Volume:  Decreased  Mood: " not good"  Affect:  Congruent, depressed  Thought Process:  Coherent  Orientation:  Full (Time, Place, and Person)  Thought Content:  Rumination , depressive   Suicidal Thoughts:  denies  Homicidal Thoughts:  No  Memory:  Immediate;   Fair Recent;   Fair Remote;   Fair  Judgement:  Impaired  Insight:  Shallow  Psychomotor Activity:  Decreased  Concentration:  Concentration: Fair  Recall:  [employer] of Knowledge:  Fair  Language:  Fair  Akathisia:  No  Handed:  Right  AIMS (if indicated):     Assets:  Desire for Improvement Housing Social Support  ADL's:  Intact  Cognition:  Impaired,  Mild   Sleep-7.15 hrs     Treatment Plan Summary: Pt depressed. Next ECT tomorrow. Cont lexapro, remeron, seroquel, prazosin.   Lenward Chancellor, MD 04/15/2017, 1:48 PMPatient ID: Michael Schmitt, male   DOB: 26-Dec-1970, 47 y.o.   MRN: [id number]

## 2017-04-15 NOTE — [facility] Group Notes (Signed)
LCSW Group Therapy Note 04/15/2017 1:15pm  Type of Therapy and Topic: Group Therapy: Feelings Around Returning Home & Establishing a Supportive Framework and Supporting Oneself When Supports Not Available  Participation Level: Minimal  Description of Group:  Patients first processed thoughts and feelings about upcoming discharge. These included fears of upcoming changes, lack of change, new living environments, judgements and expectations from others and overall stigma of mental health issues. The group then discussed the definition of a supportive framework, what that looks and feels like, and how do to discern it from an unhealthy non-supportive network. The group identified different types of supports as well as what to do when your family/friends are less than helpful or unavailable  Therapeutic Goals  1. Patient will identify one healthy supportive network that they can use at discharge. 2. Patient will identify one factor of a supportive framework and how to tell it from an unhealthy network. 3. Patient able to identify one coping skill to use when they do not have positive supports from others. 4. Patient will demonstrate ability to communicate their needs through discussion and/or role plays.  Summary of Patient Progress:  Pt scored his mood at an 8. As patients processed their anxiety about discharge and described healthy supports patient shared that he is ready to go back home. Patients identified at least one self-care tool they were willing to use after discharge.   Therapeutic Modalities Cognitive Behavioral Therapy Motivational Interviewing   Michael Schmitt  CUEBAS-COLON, LCSW 04/15/2017 11:23 AM

## 2017-04-15 NOTE — [facility] Group Notes (Signed)
[place] Group Notes:  (Nursing/MHT/Case Management/Adjunct)  Date:  04/15/2017  Time:  10:10 PM  Type of Therapy:  Group Therapy  Participation Level:  Active  Participation Quality:  Appropriate  Affect:  Appropriate  Cognitive:  Appropriate  Insight:  Appropriate  Engagement in Group:  Lacking  Modes of Intervention:  Support  Summary of Progress/Problems:  Kandis Fantasia 04/15/2017, 10:10 PM

## 2017-04-15 NOTE — Progress Notes (Signed)
Received Michael Schmitt this am after breakfast, he was compliant with his medications. He endorsed feeling very depressed. He was isolated to his room this am, but was visible in the milieu this PM.

## 2017-04-15 NOTE — Progress Notes (Signed)
Patient is calm and cooperative but endorses depression that is unchanged  In frequency and intensity, symptom occur daily, feeling hopelessness, patient is given support and encouragement, patient denies any suicidal intent or ideations, contract for safety of self and others. Patient is sleeping long hours and 15 minute round is in progress no distress noted.

## 2017-04-16 ENCOUNTER — Inpatient Hospital Stay: Payer: Medicare Other | Admitting: Certified Registered Nurse Anesthetist

## 2017-04-16 ENCOUNTER — Inpatient Hospital Stay: Payer: Medicare Other

## 2017-04-16 LAB — GLUCOSE, CAPILLARY
GLUCOSE-CAPILLARY: 147 mg/dL — AB (ref 65–99)
GLUCOSE-CAPILLARY: 127 mg/dL — AB (ref 65–99)

## 2017-04-16 MED ORDER — KETOROLAC TROMETHAMINE 30 MG/ML IJ SOLN
30.0000 mg | Freq: Once | INTRAMUSCULAR | Status: AC
  Administered 2017-04-16: 30 mg via INTRAVENOUS

## 2017-04-16 MED ORDER — SUCCINYLCHOLINE CHLORIDE 20 MG/ML IJ SOLN
INTRAMUSCULAR | Status: AC
  Filled 2017-04-16: qty 1

## 2017-04-16 MED ORDER — TRAZODONE HCL 100 MG PO TABS: 100.0000 mg | ORAL_TABLET | Freq: Every evening | ORAL | 1 refills | Status: DC | PRN

## 2017-04-16 MED ORDER — HYDROXYZINE HCL 50 MG PO TABS: 50.0000 mg | ORAL_TABLET | Freq: Three times a day (TID) | ORAL | 1 refills | Status: DC | PRN

## 2017-04-16 MED ORDER — SODIUM CHLORIDE 0.9 % IV SOLN
500.0000 mL | Freq: Once | INTRAVENOUS | Status: AC
  Administered 2017-04-16: 500 mL via INTRAVENOUS

## 2017-04-16 MED ORDER — CARVEDILOL 6.25 MG PO TABS: 6.2500 mg | ORAL_TABLET | Freq: Two times a day (BID) | ORAL | 0 refills | Status: DC

## 2017-04-16 MED ORDER — PRAZOSIN HCL 2 MG PO CAPS: 2.0000 mg | ORAL_CAPSULE | Freq: Every day | ORAL | 1 refills | Status: DC

## 2017-04-16 MED ORDER — METFORMIN HCL 500 MG PO TABS: 500.0000 mg | ORAL_TABLET | Freq: Two times a day (BID) | ORAL | 0 refills | Status: DC

## 2017-04-16 MED ORDER — LURASIDONE HCL 60 MG PO TABS: 60.0000 mg | ORAL_TABLET | Freq: Every day | ORAL | 1 refills | Status: DC

## 2017-04-16 MED ORDER — MIRTAZAPINE 45 MG PO TABS: 45.0000 mg | ORAL_TABLET | Freq: Every day | ORAL | 1 refills | Status: DC

## 2017-04-16 MED ORDER — MIDAZOLAM HCL 2 MG/2ML IJ SOLN
INTRAMUSCULAR | Status: AC
  Filled 2017-04-16: qty 4

## 2017-04-16 MED ORDER — METHOHEXITAL SODIUM 0.5 G IJ SOLR
INTRAMUSCULAR | Status: AC
  Filled 2017-04-16: qty 500

## 2017-04-16 MED ORDER — MIDAZOLAM HCL 2 MG/2ML IJ SOLN
INTRAMUSCULAR | Status: DC | PRN
  Administered 2017-04-16: 4 mg via INTRAVENOUS

## 2017-04-16 MED ORDER — METHOHEXITAL SODIUM 100 MG/10ML IV SOSY
PREFILLED_SYRINGE | INTRAVENOUS | Status: DC | PRN
  Administered 2017-04-16: 100 mg via INTRAVENOUS

## 2017-04-16 MED ORDER — QUETIAPINE FUMARATE 300 MG PO TABS: 300.0000 mg | ORAL_TABLET | Freq: Every day | ORAL | 1 refills | Status: DC

## 2017-04-16 MED ORDER — HALOPERIDOL LACTATE 5 MG/ML IJ SOLN
INTRAMUSCULAR | Status: AC
  Filled 2017-04-16: qty 1

## 2017-04-16 MED ORDER — HALOPERIDOL LACTATE 5 MG/ML IJ SOLN
INTRAMUSCULAR | Status: DC | PRN
  Administered 2017-04-16: 5 mg via INTRAVENOUS

## 2017-04-16 MED ORDER — ESCITALOPRAM OXALATE 20 MG PO TABS: 20.0000 mg | ORAL_TABLET | Freq: Every day | ORAL | 1 refills | Status: DC

## 2017-04-16 MED ORDER — ATORVASTATIN CALCIUM 80 MG PO TABS: 80.0000 mg | ORAL_TABLET | Freq: Every day | ORAL | 0 refills | Status: DC

## 2017-04-16 MED ORDER — SUCCINYLCHOLINE CHLORIDE 200 MG/10ML IV SOSY
PREFILLED_SYRINGE | INTRAVENOUS | Status: DC | PRN
  Administered 2017-04-16: 150 mg via INTRAVENOUS

## 2017-04-16 MED ORDER — ZOLPIDEM TARTRATE 10 MG PO TABS: 10.0000 mg | ORAL_TABLET | Freq: Every day | ORAL | 1 refills | Status: DC

## 2017-04-16 MED ORDER — MIDAZOLAM HCL 2 MG/2ML IJ SOLN: 4.0000 mg | Freq: Once | INTRAMUSCULAR | Status: DC

## 2017-04-16 MED ORDER — CLOPIDOGREL BISULFATE 75 MG PO TABS: 75.0000 mg | ORAL_TABLET | Freq: Every day | ORAL | 0 refills | Status: DC

## 2017-04-16 MED ORDER — SODIUM CHLORIDE 0.9 % IV SOLN
INTRAVENOUS | Status: DC | PRN
  Administered 2017-04-16: 10:00:00 via INTRAVENOUS

## 2017-04-16 MED ORDER — KETOROLAC TROMETHAMINE 30 MG/ML IJ SOLN
INTRAMUSCULAR | Status: AC
Start: 2017-04-16 — End: 2017-04-16
  Administered 2017-04-16: 30 mg via INTRAVENOUS
  Filled 2017-04-16: qty 1

## 2017-04-16 NOTE — Progress Notes (Signed)
Heart rate 64 to 80  Pvc;s and pair of pvs

## 2017-04-16 NOTE — Progress Notes (Signed)
Recreation Therapy Notes   Date: 01.21.2019  Time: 1:00 PM  Location: Craft Room  Behavioral response:N/A  Intervention Topic: Goals  Discussion/Intervention: Patient did not attend group.  Clinical Observations/Feedback:  Patient did not attend group.   Jahlon Baines LRT/CTRS           Dalonda Simoni 04/16/2017 2:21 PM

## 2017-04-16 NOTE — Discharge Summary (Signed)
Physician Discharge Summary Note  Patient:  Michael Schmitt is an 47 y.o., male MRN:  [id number] DOB:  01/01/1971 Patient phone:  [phone] (home)  Patient address:   [place] [address],  Total Time spent with patient: 45 minutes  Date of Admission:  04/04/2017 Date of Discharge: April 16, 2017  Reason for Admission: Patient was admitted when he presented back for ECT treatments in an agitated panicky state once again crying and striking himself on the head and unable to control his intrusive suicidal thoughts.  He was admitted for safety while medicines were adjusted and ECT was continued  Principal Problem: Severe recurrent major depression without psychotic features [facility]) Discharge Diagnoses: Patient Active Problem List   Diagnosis Date Noted  . Severe recurrent major depression without psychotic features ([place]) [F33.2] 03/12/2017    Priority: High  . Generalized anxiety disorder [F41.1] 02/05/2017    Priority: High  . Chronic Suicidal ideation [R45.851] 09/15/2016    Priority: High  . Chronic pain syndrome [G89.4] 11/21/2016    Priority: Medium  . Uncontrolled type 2 diabetes mellitus without complication, without long-term current use of insulin ([place]) [E11.65] 10/19/2016    Priority: Medium  . Coronary artery disease [I25.10] 09/12/2016    Priority: Medium  . Combined hyperlipidemia [E78.2] 01/05/2016    Priority: Low  . Chronic neck pain (Primary Area of Pain) (Left) [M54.2, G89.29] 11/22/2016  . Cervical facet syndrome (Left) [V25.366] 11/22/2016  . Cervical radiculitis (Left) [M54.12] 11/22/2016  . History of cervical spinal surgery [Z98.890] 11/22/2016  . Cervical spondylosis [M47.22] 11/22/2016  . Chronic shoulder pain (Secondary Area of Pain) (Left) [M25.512, G89.29] 11/22/2016  . 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 Chronic low back pain [facility] Area of Pain) (Bilateral) (L>R) [M54.5, G89.29] 11/22/2016  . Lumbar facet  syndrome (Bilateral) (L>R) [M47.816] 11/22/2016  . Long term prescription benzodiazepine use [Z79.899] 11/21/2016  . Opiate use [F11.90] 11/21/2016  . HTN (hypertension) [I10] 06/26/2016  . GERD (gastroesophageal reflux disease) [K21.9] 06/26/2016  . Alcohol use disorder, severe, 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[place]) [F10.21] 06/26/2016  . Moderate benzodiazepine use disorder ([place]) [F13.20] 06/11/2016  . Sedative, hypnotic or anxiolytic use disorder, severe, dependence ([place]) [F13.20] 05/12/2016  . Ventricular fibrillation ([place]) [I49.01] 04/05/2016  . Cervical spondylosis without myelopathy [M47.812] 07/24/2014  . Severe episode of recurrent major depressive disorder, without psychotic features ([place]) [F33.2] 04/15/2013  . Tobacco use disorder [F17.200] 10/20/2010  . Asthma [J45.909] 10/20/2010    Past Psychiatric History: Patient has a very long history of mental health problems with both depression and anxiety symptoms.  He has a long history of overreliance on benzodiazepines that was counterproductive.  Positive past history of suicide attempts.  No clear history of real mania.  Past Medical History:  Past Medical History:  Diagnosis Date  . Anxiety   . Anxiety   . Arthritis    cerv. stenosis, spondylosis, HNP- lower back , has been followed in pain clinic, has  had injection s in cerv. area  . Blood dyscrasia    told that when he was young he was a" free bleeder"  . CAD (coronary artery disease)   . Cervical spondylosis without myelopathy 07/24/2014  . Cervicogenic headache 07/24/2014  . Chronic kidney disease    renal calculi- passed spontaneously  . Depression   . Diabetes mellitus without complication ([place])   . Fatty liver   . GERD (gastroesophageal reflux disease)   . Headache(784.0)   . Hyperlipidemia   .  Hypertension   . Mental disorder   . MI, old   . RLS (restless legs syndrome)    detected on sleep study  . Shortness of breath   . Ventricular fibrillation ([place]) 04/05/2016     Past Surgical History:  Procedure Laterality Date  . ANTERIOR CERVICAL DECOMP/DISCECTOMY FUSION  11/13/2011   Procedure: ANTERIOR CERVICAL DECOMPRESSION/DISCECTOMY FUSION 1 LEVEL;  Surgeon: [name], MD;  Location: [facility];  Service: Neurosurgery;  Laterality: N/A;  Anterior Cervical Decompression/discectomy Fusion. Cervical three-four.  Marland Kitchen CARDIAC CATHETERIZATION N/A 01/01/2015   Procedure: Left Heart Cath and Coronary Angiography;  Surgeon: [name], MD;  Location: [place] CV LAB;  Service: Cardiovascular;  Laterality: N/A;  . CARDIAC CATHETERIZATION N/A 04/05/2016   Procedure: Left Heart Cath and Coronary Angiography;  Surgeon: Belva Crome, MD;  Location: [place] CV LAB;  Service: Cardiovascular;  Laterality: N/A;  . CARDIAC CATHETERIZATION N/A 04/05/2016   Procedure: Coronary Stent Intervention;  Surgeon: Belva Crome, MD;  Location: [place] CV LAB;  Service: Cardiovascular;  Laterality: N/A;  . CARDIAC CATHETERIZATION N/A 04/05/2016   Procedure: Intravascular Ultrasound/IVUS;  Surgeon: Belva Crome, MD;  Location: [facility] LAB;  Service: Cardiovascular;  Laterality: N/A;  . NASAL SINUS SURGERY     2005   Family History:  Family History  Problem Relation Age of Onset  . Prostate cancer Father   . Hypertension Mother   . Kidney Stones Mother   . Anxiety disorder Mother   . Depression Mother   . COPD Sister   . Hypertension Sister   . Diabetes Sister   . Depression Sister   . Anxiety disorder Sister   . Seizures Sister   . ADD / ADHD Son   . ADD / ADHD Daughter    Family Psychiatric  History: Positive for anxiety and depression Social History:  Social History   Substance and Sexual Activity  Alcohol Use No  . Alcohol/week: 0.0 oz   Comment: last drinked in 3 months.      Social History   Substance and Sexual Activity  Drug Use No    Social History   Socioeconomic History  . Marital status: Widowed    Spouse name: None  . Number of  children: 3  . Years of education: 39  . Highest education level: None  Social Needs  . Financial resource strain: None  . Food insecurity - worry: None  . Food insecurity - inability: None  . Transportation needs - medical: None  . Transportation needs - non-medical: None  Occupational History    Comment: unemployed  Tobacco Use  . Smoking status: Current Every Day Smoker    Packs/day: 2.00    Years: 17.00    Pack years: 34.00    Types: Cigarettes, Cigars  . Smokeless tobacco: Former Systems developer  . Tobacco comment: occassional snuff   Substance and Sexual Activity  . Alcohol use: No    Alcohol/week: 0.0 oz    Comment: last drinked in 3 months.   . Drug use: No  . Sexual activity: Not Currently  Other Topics Concern  . None  Social History Narrative   Patient is right handed.   Patient drinks 2 sodas daily.    Hospital Course: Patient was admitted to the psychiatric ward and was maintained on 15-minute precautions.  He did not attempt to kill himself but at least initially remain somewhat agitated.  He was cooperative with the ECT which has now been  pursued on a 3 times a week basis since he came back into the hospital.  He has now received a total of 6 treatments which have been overall well tolerated.  Mood is finally showing some improvements.  He is no longer voicing active suicidal ideation.  He describes himself as feeling "okay" now rather than always feeling terrible.  He is able to attend some groups and interact appropriately.  Medicines have been adjusted as well with an increase in antidepressant medication.  Also a change from [place] switching over to a plan to get him on to Seroquel.  Patient is now stating that he feels safe and comfortable with discharge.  Physical Findings: AIMS: Facial and Oral Movements Muscles of Facial Expression: None, normal Lips and Perioral Area: None, normal Jaw: None, normal Tongue: None, normal,Extremity Movements Upper (arms, wrists,  hands, fingers): None, normal Lower (legs, knees, ankles, toes): None, normal, Trunk Movements Neck, shoulders, hips: None, normal, Overall Severity Severity of abnormal movements (highest score from questions above): None, normal Incapacitation due to abnormal movements: None, normal Patient's awareness of abnormal movements (rate only patient's report): No Awareness, Dental Status Current problems with teeth and/or dentures?: No Does patient usually wear dentures?: No  CIWA:    COWS:     Musculoskeletal: Strength & Muscle Tone: within normal limits Gait & Station: normal Patient leans: N/A  Psychiatric Specialty Exam: Physical Exam  Nursing note and vitals reviewed. Constitutional: He appears well-developed and well-nourished.  HENT:  Head: Normocephalic and atraumatic.  Eyes: Conjunctivae are normal. Pupils are equal, round, and reactive to light.  Neck: Normal range of motion.  Cardiovascular: Regular rhythm and normal heart sounds.  Respiratory: Effort normal. No respiratory distress.  GI: Soft.  Musculoskeletal: Normal range of motion.  Neurological: He is alert.  Skin: Skin is warm and dry.  Psychiatric: Judgment normal. His affect is blunt. His speech is delayed. He is slowed. Thought content is not paranoid. He does not exhibit a depressed mood. He expresses no homicidal and no suicidal ideation. He exhibits abnormal recent memory.    Review of Systems  Constitutional: Negative.   HENT: Negative.   Eyes: Negative.   Respiratory: Negative.   Cardiovascular: Negative.   Gastrointestinal: Negative.   Musculoskeletal: Negative.   Skin: Negative.   Neurological: Negative.   Psychiatric/Behavioral: Positive for memory loss. Negative for depression, hallucinations, substance abuse and suicidal ideas. The patient is not nervous/anxious and does not have insomnia.     Blood pressure (!) 153/84, pulse 76, temperature 98.2 F (36.8 C), temperature source Oral, resp. rate  20, height 6\' 2"  (1.88 m), weight 90.7 kg (200 lb), SpO2 94 %.Body mass index is 25.68 kg/m.  General Appearance: Casual  Eye Contact:  Good  Speech:  Slow  Volume:  Decreased  Mood:  Euthymic  Affect:  Constricted  Thought Process:  Goal Directed  Orientation:  Full (Time, Place, and Person)  Thought Content:  Logical  Suicidal Thoughts:  No  Homicidal Thoughts:  No  Memory:  Immediate;   Fair Recent;   Fair Remote;   Fair  Judgement:  Fair  Insight:  Fair  Psychomotor Activity:  Normal  Concentration:  Concentration: Fair  Recall:  [employer] of Knowledge:  Fair  Language:  Fair  Akathisia:  No  Handed:  Right  AIMS (if indicated):     Assets:  Desire for Improvement Housing Resilience Social Support  ADL's:  Intact  Cognition:  WNL  Sleep:  Number of  Hours: 7.75     Have you used any form of tobacco in the last 30 days? (Cigarettes, Smokeless Tobacco, Cigars, and/or Pipes): Yes  Has this patient used any form of tobacco in the last 30 days? (Cigarettes, Smokeless Tobacco, Cigars, and/or Pipes) Yes, Yes, A prescription for an FDA-approved tobacco cessation medication was offered at discharge and the patient refused  Blood Alcohol level:  Lab Results  Component Value Date   ETH 37 (H) 03/12/2017   ETH <10 70/48/8891    Metabolic Disorder Labs:  Lab Results  Component Value Date   HGBA1C 6.0 (H) 03/13/2017   MPG 126 03/13/2017   MPG 177 09/12/2016   Lab Results  Component Value Date   PROLACTIN 18.1 (H) 09/12/2016   PROLACTIN 6.5 05/27/2016   Lab Results  Component Value Date   CHOL 118 03/13/2017   TRIG 325 (H) 03/13/2017   HDL 31 (L) 03/13/2017   CHOLHDL 3.8 03/13/2017   VLDL 65 (H) 03/13/2017   LDLCALC 22 03/13/2017   LDLCALC 37 09/12/2016    See Psychiatric Specialty Exam and Suicide Risk Assessment completed by Attending Physician prior to discharge.  Discharge destination:  Home  Is patient on multiple antipsychotic therapies at  discharge:  Yes,   Do you recommend tapering to monotherapy for antipsychotics?  Yes   Has Patient had three or more failed trials of antipsychotic monotherapy by history:  Yes,   Antipsychotic medications that previously failed include:   1.  Latuda., 2.  Risperdal. and 3.  Haldol.  Recommended Plan for Multiple Antipsychotic Therapies: Patient's medications are in the process of a cross-taper;  medications include:  Plan at this point is gradual reduction of Latuda as he appears to be tolerating and benefiting from the Seroquel instead  Discharge Instructions    Diet - low sodium heart healthy   Complete by:  As directed    Increase activity slowly   Complete by:  As directed      Allergies as of 04/16/2017      Reactions   Prednisone Other (See Comments)   Hypertension, makes him feel spacey   Hydrocodone Other (See Comments)   Headache, irritable   Varenicline Other (See Comments)   Suicidal thoughts       Follow-up Information    Care, [place] on 04/23/2017.   Why:  Please follow up with Dr. Collie Siad on Monday April 23, 2017 at 1pm. Thank you. Contact information: [place] [address] [phone]           Follow-up recommendations:  Activity:  Activity as tolerated Diet:  Heart healthy and low carb diet Other:  Follow-up with current medication, with outpatient provider and also with maintenance ECT with next treatment on Wednesday  Comments: Patient is being discharged today and is denying any current suicidal ideation.  I am aware of the tenacious course of his illness and the possibility that suicidal ideation may return and have spent some time coaching the patient on this and encouraging him to use coping skills rather than to panic.  We plan to see him back on Wednesday for another ECT treatment.  Prescriptions are completed for current medicine.  Signed: Alethia Berthold, MD 04/16/2017, 3:55 PM

## 2017-04-16 NOTE — BHH Suicide Risk Assessment (Signed)
[facility] Discharge Suicide Risk Assessment   Principal Problem: Severe recurrent major depression without psychotic features [facility]) Discharge Diagnoses:  Patient Active Problem List   Diagnosis Date Noted  . Severe recurrent major depression without psychotic features ([place]) [F33.2] 03/12/2017    Priority: High  . Generalized anxiety disorder [F41.1] 02/05/2017    Priority: High  . Chronic Suicidal ideation [R45.851] 09/15/2016    Priority: High  . Chronic pain syndrome [G89.4] 11/21/2016    Priority: Medium  . Uncontrolled type 2 diabetes mellitus without complication, without long-term current use of insulin ([place]) [E11.65] 10/19/2016    Priority: Medium  . Coronary artery disease [I25.10] 09/12/2016    Priority: Medium  . Combined hyperlipidemia [E78.2] 01/05/2016    Priority: Low  . Chronic neck pain (Primary Area of Pain) (Left) [M54.2, G89.29] 11/22/2016  . Cervical facet syndrome (Left) [D53.299] 11/22/2016  . Cervical radiculitis (Left) [M54.12] 11/22/2016  . History of cervical spinal surgery [Z98.890] 11/22/2016  . Cervical spondylosis [M47.22] 11/22/2016  . Chronic shoulder pain (Secondary Area of Pain) (Left) [M25.512, G89.29] 11/22/2016  . 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 Chronic low back pain [facility] Area of Pain) (Bilateral) (L>R) [M54.5, G89.29] 11/22/2016  . Lumbar facet syndrome (Bilateral) (L>R) [M47.816] 11/22/2016  . Long term prescription benzodiazepine use [Z79.899] 11/21/2016  . Opiate use [F11.90] 11/21/2016  . HTN (hypertension) [I10] 06/26/2016  . GERD (gastroesophageal reflux disease) [K21.9] 06/26/2016  . Alcohol use disorder, severe, 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[place]) [F10.21] 06/26/2016  . Moderate benzodiazepine use disorder ([place]) [F13.20] 06/11/2016  . Sedative, hypnotic or anxiolytic use disorder, severe, dependence ([place]) [F13.20] 05/12/2016  . Ventricular fibrillation ([place]) [I49.01] 04/05/2016  . Cervical spondylosis without myelopathy  [M47.812] 07/24/2014  . Severe episode of recurrent major depressive disorder, without psychotic features ([place]) [F33.2] 04/15/2013  . Tobacco use disorder [F17.200] 10/20/2010  . Asthma [J45.909] 10/20/2010    Total Time spent with patient: 45 minutes  Musculoskeletal: Strength & Muscle Tone: within normal limits Gait & Station: normal Patient leans: N/A  Psychiatric Specialty Exam: Review of Systems  Constitutional: Negative.   HENT: Negative.   Eyes: Negative.   Respiratory: Negative.   Cardiovascular: Negative.   Gastrointestinal: Negative.   Musculoskeletal: Negative.   Skin: Negative.   Neurological: Negative.   Psychiatric/Behavioral: Positive for memory loss. Negative for depression, hallucinations, substance abuse and suicidal ideas. The patient is not nervous/anxious and does not have insomnia.     Blood pressure (!) 153/84, pulse 76, temperature 98.2 F (36.8 C), temperature source Oral, resp. rate 20, height 6\' 2"  (1.88 m), weight 90.7 kg (200 lb), SpO2 94 %.Body mass index is 25.68 kg/m.  General Appearance: Casual  Eye Contact::  Good  Speech:  Slow409  Volume:  Decreased  Mood:  Euthymic  Affect:  Constricted  Thought Process:  Goal Directed  Orientation:  Full (Time, Place, and Person)  Thought Content:  Logical  Suicidal Thoughts:  No  Homicidal Thoughts:  No  Memory:  Immediate;   Fair Recent;   Fair Remote;   Fair  Judgement:  Fair  Insight:  Fair  Psychomotor Activity:  Normal  Concentration:  Fair  Recall:  [employer] of [place]  Language: Fair  Akathisia:  No  Handed:  Right  AIMS (if indicated):     Assets:  Communication Skills Desire for Improvement Financial Resources/Insurance Housing Resilience Social Support  Sleep:  Number of Hours: 7.75  Cognition: Impaired,  Mild  ADL's:  Intact   Mental  Status Per Nursing Assessment::   On Admission:     Demographic Factors:  Male, Divorced or widowed, Caucasian and  Unemployed  Loss Factors: Decrease in vocational status, Decline in physical health and Financial problems/change in socioeconomic status  Historical Factors: Prior suicide attempts and Impulsivity  Risk Reduction Factors:   Responsible for children under 33 years of age, Sense of responsibility to family, Religious beliefs about death, Living with another person, especially a relative, Positive social support and Positive therapeutic relationship  Continued Clinical Symptoms:  Depression:   Anhedonia  Cognitive Features That Contribute To Risk:  Closed-mindedness    Suicide Risk:  Mild:  Suicidal ideation of limited frequency, intensity, duration, and specificity.  There are no identifiable plans, no associated intent, mild dysphoria and related symptoms, good self-control (both objective and subjective assessment), few other risk factors, and identifiable protective factors, including available and accessible social support.  Follow-up Information    Care, [place] on 04/23/2017.   Why:  Please follow up with Dr. Collie Siad on Monday April 23, 2017 at 1pm. Thank you. Contact information: [place] [address] [phone]           Plan Of Care/Follow-up recommendations:  Activity:  Activity as tolerated Diet:  Heart healthy diet with an emphasis on low-carb diet Other:  Patient is to continue current medicine and follow-up with his outpatient provider but also he is to continue ECT at this time with a next scheduled appointment on Wednesday, 23 January  Alethia Berthold, MD 04/16/2017, 3:52 PM

## 2017-04-16 NOTE — BHH Group Notes (Signed)
04/16/2017 9:30AM Type of Therapy and Topic:  Group Therapy:  Overcoming Obstacles  Participation Level:  Did Not Attend    Description of Group:    In this group patients will be encouraged to explore what they see as obstacles to their own wellness and recovery. They will be guided to discuss their thoughts, feelings, and behaviors related to these obstacles. The group will process together ways to cope with barriers, with attention given to specific choices patients can make. Each patient will be challenged to identify changes they are motivated to make in order to overcome their obstacles. This group will be process-oriented, with patients participating in exploration of their own experiences as well as giving and receiving support and challenge from other group members.   Therapeutic Goals: 1. Patient will identify personal and current obstacles as they relate to admission. 2. Patient will identify barriers that currently interfere with their wellness or overcoming obstacles.  3. Patient will identify feelings, thought process and behaviors related to these barriers. 4. Patient will identify two changes they are willing to make to overcome these obstacles:      Summary of Patient Progress Patient was encouraged and invited to attend group. Patient did not attend group. Social worker will continue to encourage group participation in the future.     Therapeutic Modalities:   Cognitive Behavioral Therapy Solution Focused Therapy Motivational Interviewing Relapse Prevention Therapy    Darin Engels MSW, [place] 04/16/2017 10:41 AM

## 2017-04-16 NOTE — Transfer of Care (Signed)
Immediate Anesthesia Transfer of Care Note  Patient: Michael Schmitt  Procedure(s) Performed: ECT TX  Patient Location: PACU  Anesthesia Type:General  Level of Consciousness: sedated  Airway & Oxygen Therapy: Patient Spontanous Breathing and Patient connected to face mask oxygen  Post-op Assessment: Report given to RN and Post -op Vital signs reviewed and stable  Post vital signs: Reviewed and stable  Last Vitals:  Vitals:   04/16/17 0627 04/16/17 0929  BP: (!) 129/93 (!) 148/93  Pulse: 73 62  Resp:  16  Temp:  (!) 36.3 C  SpO2:  98%    Last Pain:  Vitals:   04/16/17 0929  TempSrc: Oral  PainSc:       Patients Stated Pain Goal: 0 (56/70/14 1030)  Complications: No apparent anesthesia complications

## 2017-04-16 NOTE — Progress Notes (Signed)
Affect blunted.  Denies SI/HI/AVH.  Rates depression as a 8/10. Discharge instructions given, verbalized understanding.  Prescriptions given and personal belongings returned.  Escorted off unit by this Probation officer to meet sister to travel home.

## 2017-04-16 NOTE — BHH Group Notes (Signed)
[facility] Group Notes:  (Nursing/MHT/Case Management/Adjunct)  Date:  04/16/2017  Time:  3:11 PM  Type of Therapy:  Psychoeducational Skills  Participation Level:  Minimal  Participation Quality:  Inattentive  Affect:  Flat  Cognitive:  Lacking  Insight:  Lacking and Limited  Engagement in Group:  Limited  Modes of Intervention:  Discussion, Education and Support  Summary of Progress/Problems:  Adela Lank Yuktha Kerchner 04/16/2017, 3:11 PM

## 2017-04-16 NOTE — Progress Notes (Signed)
  [facility] Adult Case Management Discharge Plan :  Will you be returning to the same living situation after discharge:  Yes,  Sisters home At discharge, do you have transportation home?: Yes,  sister will come get. Do you have the ability to pay for your medications: Yes,  Medicare  Release of information consent forms completed and in the chart;  Patient's signature needed at discharge.  Patient to Follow up at: Follow-up Information    Care, [place] on 04/23/2017.   Why:  Please follow up with Dr. Collie Siad on Monday April 23, 2017 at 1pm. Thank you. Contact information: [place] [address] [phone]           Next level of care provider has access to [place] and Suicide Prevention discussed: Yes,  Sister Olin Hauser  Have you used any form of tobacco in the last 30 days? (Cigarettes, Smokeless Tobacco, Cigars, and/or Pipes): Yes  Has patient been referred to the Quitline?: Patient refused referral  Patient has been referred for addiction treatment: N/A  Darin Engels, LCSW 04/16/2017, 3:33 PM

## 2017-04-16 NOTE — Procedures (Signed)
ECT SERVICES Physician's Interval Evaluation & Treatment Note  Patient Identification: Michael Schmitt MRN:  [id number] Date of Evaluation:  04/16/2017 TX #: [id number]  MADRS:   MMSE:   P.E. Findings:  No change to physical exam  Psychiatric Interval Note:  She actively a little better not acutely suicidal  Subjective:  Patient is a 47 y.o. male seen for evaluation for Electroconvulsive Therapy. Feeling better  Treatment Summary:   []   Right Unilateral             [x]  Bilateral   % Energy : 1.0 ms 50%   Impedance: 1950 ohms  Seizure Energy Index: 7049 V squared  Postictal Suppression Index: 88%  Seizure Concordance Index: 99%  Medications  Pre Shock: Toradol 30 mg Brevital 100 mg succinylcholine 150 mg  Post Shock: Versed 4 mg Haldol 5 mg  Seizure Duration: EMG 35 seconds EEG 55 seconds   Comments: Follow-up Wednesday as an outpatient  Lungs:  [x]   Clear to auscultation               []  Other:   Heart:    [x]   Regular rhythm             []  irregular rhythm    [x]   Previous H&P reviewed, patient examined and there are NO CHANGES                 []   Previous H&P reviewed, patient examined and there are changes noted.   Alethia Berthold, MD 1/21/201910:12 AM

## 2017-04-16 NOTE — Anesthesia Postprocedure Evaluation (Signed)
Anesthesia Post Note  Patient: Michael Schmitt  Procedure(s) Performed: ECT TX  Patient location during evaluation: PACU Anesthesia Type: General Level of consciousness: awake and alert Pain management: pain level controlled Vital Signs Assessment: post-procedure vital signs reviewed and stable Respiratory status: spontaneous breathing, nonlabored ventilation and respiratory function stable Cardiovascular status: blood pressure returned to baseline and stable Postop Assessment: no signs of nausea or vomiting Anesthetic complications: no     Last Vitals:  Vitals:   04/16/17 1104 04/16/17 1106  BP: 133/78 (!) 147/81  Pulse: 97 75  Resp: (!) 21 15  Temp:  36.7 C  SpO2: 96% 94%    Last Pain:  Vitals:   04/16/17 0929  TempSrc: Oral  PainSc:                  Jo-Ann Johanning

## 2017-04-16 NOTE — BHH Counselor (Signed)
CSW spoke to patients sister Olin Hauser and she will be coming to pick the patient up for discharge at 5pm today.    Darin Engels, MSW, [place], [place] 04/16/2017 3:47 PM

## 2017-04-16 NOTE — H&P (Signed)
Michael Schmitt is an 47 y.o. male.   Chief Complaint: Patient is feeling better.  Not currently depressed or suicidal HPI: History of recurrent severe depression responding gradually to ECT  Past Medical History:  Diagnosis Date  . Anxiety   . Anxiety   . Arthritis    cerv. stenosis, spondylosis, HNP- lower back , has been followed in pain clinic, has  had injection s in cerv. area  . Blood dyscrasia    told that when he was young he was a" free bleeder"  . CAD (coronary artery disease)   . Cervical spondylosis without myelopathy 07/24/2014  . Cervicogenic headache 07/24/2014  . Chronic kidney disease    renal calculi- passed spontaneously  . Depression   . Diabetes mellitus without complication ([place])   . Fatty liver   . GERD (gastroesophageal reflux disease)   . Headache(784.0)   . Hyperlipidemia   . Hypertension   . Mental disorder   . MI, old   . RLS (restless legs syndrome)    detected on sleep study  . Shortness of breath   . Ventricular fibrillation ([place]) 04/05/2016    Past Surgical History:  Procedure Laterality Date  . ANTERIOR CERVICAL DECOMP/DISCECTOMY FUSION  11/13/2011   Procedure: ANTERIOR CERVICAL DECOMPRESSION/DISCECTOMY FUSION 1 LEVEL;  Surgeon: [name], MD;  Location: [facility];  Service: Neurosurgery;  Laterality: N/A;  Anterior Cervical Decompression/discectomy Fusion. Cervical three-four.  Marland Kitchen CARDIAC CATHETERIZATION N/A 01/01/2015   Procedure: Left Heart Cath and Coronary Angiography;  Surgeon: [name], MD;  Location: [facility];  Service: Cardiovascular;  Laterality: N/A;  . CARDIAC CATHETERIZATION N/A 04/05/2016   Procedure: Left Heart Cath and Coronary Angiography;  Surgeon: Belva Crome, MD;  Location: [facility];  Service: Cardiovascular;  Laterality: N/A;  . CARDIAC CATHETERIZATION N/A 04/05/2016   Procedure: Coronary Stent Intervention;  Surgeon: Belva Crome, MD;  Location: [facility];  Service: Cardiovascular;   Laterality: N/A;  . CARDIAC CATHETERIZATION N/A 04/05/2016   Procedure: Intravascular Ultrasound/IVUS;  Surgeon: Belva Crome, MD;  Location: [facility];  Service: Cardiovascular;  Laterality: N/A;  . NASAL SINUS SURGERY     2005    Family History  Problem Relation Age of Onset  . Prostate cancer Father   . Hypertension Mother   . Kidney Stones Mother   . Anxiety disorder Mother   . Depression Mother   . COPD Sister   . Hypertension Sister   . Diabetes Sister   . Depression Sister   . Anxiety disorder Sister   . Seizures Sister   . ADD / ADHD Son   . ADD / ADHD Daughter    Social History:  reports that he has been smoking cigarettes and cigars.  He has a 34.00 pack-year smoking history. He has quit using smokeless tobacco. He reports that he does not drink alcohol or use drugs.  Allergies:  Allergies  Allergen Reactions  . Prednisone Other (See Comments)    Hypertension, makes him feel spacey  . Hydrocodone Other (See Comments)    Headache, irritable  . Varenicline Other (See Comments)    Suicidal thoughts    Medications Prior to Admission  Medication Sig Dispense Refill  . aspirin EC 81 MG EC tablet Take 1 tablet (81 mg total) by mouth daily. 30 tablet 1  . atorvastatin (LIPITOR) 80 MG tablet Take 1 tablet (80 mg total) by mouth daily at 6 PM. 30 tablet 1  .  carvedilol (COREG) 6.25 MG tablet Take 1 tablet (6.25 mg total) by mouth 2 (two) times daily with a meal. 60 tablet 1  . clopidogrel (PLAVIX) 75 MG tablet Take 1 tablet (75 mg total) by mouth daily. 30 tablet 1  . escitalopram (LEXAPRO) 20 MG tablet Take 1 tablet (20 mg total) by mouth daily. 30 tablet 1  . hydrOXYzine (ATARAX/VISTARIL) 50 MG tablet Take 1 tablet (50 mg total) by mouth 3 (three) times daily as needed for anxiety. 90 tablet 1  . lurasidone 60 MG TABS Take 1 tablet (60 mg total) by mouth daily with breakfast. 30 tablet 1  . metFORMIN (GLUCOPHAGE) 500 MG tablet Take 1 tablet (500 mg total) by  mouth 2 (two) times daily with a meal. 60 tablet 1  . mirtazapine (REMERON) 15 MG tablet Take 1 tablet (15 mg total) by mouth at bedtime. 30 tablet 0  . nitroGLYCERIN (NITROSTAT) 0.4 MG SL tablet Place 1 tablet (0.4 mg total) under the tongue every 5 (five) minutes x 3 doses as needed. 10 tablet 0  . prazosin (MINIPRESS) 2 MG capsule Take 1 capsule (2 mg total) by mouth at bedtime. 30 capsule 1  . traZODone (DESYREL) 100 MG tablet Take 1 tablet (100 mg total) by mouth at bedtime as needed for sleep. 30 tablet 1  . zolpidem (AMBIEN) 10 MG tablet Take 1 tablet (10 mg total) by mouth at bedtime. 30 tablet 0    Results for orders placed or performed during the hospital encounter of 04/04/17 (from the past 48 hour(s))  Glucose, capillary     Status: Abnormal   Collection Time: 04/15/17  7:07 AM  Result Value Ref Range   Glucose-Capillary 140 (H) 65 - 99 mg/dL  Glucose, capillary     Status: Abnormal   Collection Time: 04/16/17  7:02 AM  Result Value Ref Range   Glucose-Capillary 147 (H) 65 - 99 mg/dL   No results found.  Review of Systems  Constitutional: Negative.   HENT: Negative.   Eyes: Negative.   Respiratory: Negative.   Cardiovascular: Negative.   Gastrointestinal: Negative.   Musculoskeletal: Negative.   Skin: Negative.   Neurological: Negative.   Psychiatric/Behavioral: Positive for memory loss. Negative for depression, hallucinations, substance abuse and suicidal ideas. The patient is not nervous/anxious and does not have insomnia.     Blood pressure (!) 148/93, pulse 62, temperature (!) 97.4 F (36.3 C), temperature source Oral, resp. rate 16, height 6\' 2"  (1.88 m), weight 90.7 kg (200 lb), SpO2 98 %. Physical Exam  Nursing note and vitals reviewed. Constitutional: He appears well-developed and well-nourished.  HENT:  Head: Normocephalic and atraumatic.  Eyes: Conjunctivae are normal. Pupils are equal, round, and reactive to light.  Neck: Normal range of motion.   Cardiovascular: Regular rhythm and normal heart sounds.  Respiratory: Effort normal. No respiratory distress.  GI: Soft.  Musculoskeletal: Normal range of motion.  Neurological: He is alert.  Skin: Skin is warm and dry.  Psychiatric: Judgment normal. His affect is blunt. His speech is delayed. He is slowed. Thought content is not paranoid. Cognition and memory are impaired. He expresses no homicidal and no suicidal ideation.     Assessment/Plan Patient is asking for discharge today and is no longer reporting suicidal ideation he has been calm and more cooperative.  We will consider possibly discharged today and outpatient treatment on Wednesday.  Alethia Berthold, MD 04/16/2017, 10:10 AM

## 2017-04-16 NOTE — Progress Notes (Signed)
Patient was cooperative with treatment, he was medication complaint, he still reports feelings of hopelessness, he denies any suicidal intent or ideations, he is able to contract for safety of self and others. Patient is currently in bed resting at this time. No distress or behavioral issues to report on shift at this time.

## 2017-04-16 NOTE — Anesthesia Post-op Follow-up Note (Signed)
Anesthesia QCDR form completed.        

## 2017-04-16 NOTE — Anesthesia Procedure Notes (Signed)
Performed by: Demetrius Charity, CRNA Pre-anesthesia Checklist: Patient identified, Patient being monitored, Timeout performed, Emergency Drugs available and Suction available Patient Re-evaluated:Patient Re-evaluated prior to induction Oxygen Delivery Method: Circle system utilized Preoxygenation: Pre-oxygenation with 100% oxygen Ventilation: Mask ventilation without difficulty Airway Equipment and Method: Bite block Dental Injury: Teeth and Oropharynx as per pre-operative assessment

## 2017-04-16 NOTE — BHH Counselor (Signed)
CSW called patients sister Olin Hauser for discharge pick up directions. Olin Hauser did not answer the call and CSW was unable to leave a voicemail. CSW will continue to reach patients sister.   Darin Engels, MSW, [place], LCASA 04/16/2017 1:54 PM

## 2017-04-16 NOTE — Anesthesia Preprocedure Evaluation (Signed)
Anesthesia Evaluation  Patient identified by MRN, date of birth, ID band Patient awake    Reviewed: Allergy & Precautions, NPO status , Patient's Chart, lab work & pertinent test results  History of Anesthesia Complications Negative for: history of anesthetic complications  Airway Mallampati: II  TM Distance: >3 FB Neck ROM: Full    Dental no notable dental hx.    Pulmonary asthma , neg sleep apnea, Current Smoker,    breath sounds clear to auscultation- rhonchi (-) wheezing      Cardiovascular hypertension, (-) angina+ CAD, + Past MI and + Cardiac Stents   Rhythm:Regular Rate:Normal - Systolic murmurs and - Diastolic murmurs    Neuro/Psych  Headaches, PSYCHIATRIC DISORDERS Anxiety Depression    GI/Hepatic Neg liver ROS, GERD  ,  Endo/Other  diabetes, Oral Hypoglycemic Agents  Renal/GU negative Renal ROS     Musculoskeletal  (+) Arthritis ,   Abdominal (+) - obese,   Peds  Hematology negative hematology ROS (+)   Anesthesia Other Findings Past Medical History: No date: Anxiety No date: Anxiety No date: Arthritis     Comment:  cerv. stenosis, spondylosis, HNP- lower back , has been               followed in pain clinic, has  had injection s in cerv.               area No date: Blood dyscrasia     Comment:  told that when he was young he was a" free bleeder" No date: CAD (coronary artery disease) 07/24/2014: Cervical spondylosis without myelopathy 07/24/2014: Cervicogenic headache No date: Chronic kidney disease     Comment:  renal calculi- passed spontaneously No date: Depression No date: Diabetes mellitus without complication ([facility]) No date: Fatty liver No date: GERD (gastroesophageal reflux disease) No date: Headache(784.0) No date: Hyperlipidemia No date: Hypertension No date: Mental disorder No date: MI, old No date: RLS (restless legs syndrome)     Comment:  detected on sleep study No date:  Shortness of breath 04/05/2016: Ventricular fibrillation ([place])   Reproductive/Obstetrics                             Anesthesia Physical  Anesthesia Plan  ASA: III  Anesthesia Plan: General   Post-op Pain Management:    Induction: Intravenous  PONV Risk Score and Plan: 0  Airway Management Planned: Mask  Additional Equipment:   Intra-op Plan:   Post-operative Plan:   Informed Consent: I have reviewed the patients History and Physical, chart, labs and discussed the procedure including the risks, benefits and alternatives for the proposed anesthesia with the patient or authorized representative who has indicated his/her understanding and acceptance.   Dental advisory given  Plan Discussed with: CRNA and Anesthesiologist  Anesthesia Plan Comments:         Anesthesia Quick Evaluation

## 2017-04-17 NOTE — Progress Notes (Signed)
Recreation Therapy Notes  INPATIENT RECREATION TR PLAN  Patient Details Name: Michael Schmitt MRN: [id number] DOB: 28-Jan-1971 Today's Date: 04/17/2017  Rec Therapy Plan Is patient appropriate for Therapeutic Recreation?: Yes Treatment times per week: at least 3 Estimated Length of Stay: 5-7 days TR Treatment/Interventions: Group participation (Comment)  Discharge Criteria Pt will be discharged from therapy if:: Discharged Treatment plan/goals/alternatives discussed and agreed upon by:: Patient/family  Discharge Summary Short term goals set: Patient will identify 3 triggers for depressive symptoms x5 days.  Short term goals met: Adequate for discharge Progress toward goals comments: Groups attended Which groups?: Stress management, Leisure education, Coping skills, Anger management, Other (Comment)(Emotions, Creative expressions) Reason goals not met: N/A Therapeutic equipment acquired: N/A Reason patient discharged from therapy: Discharge from hospital Pt/family agrees with progress & goals achieved: Yes Date patient discharged from therapy: 04/16/17   Michael Schmitt 04/17/2017, 10:55 AM

## 2017-04-18 ENCOUNTER — Telehealth: Payer: Self-pay

## 2017-04-18 ENCOUNTER — Other Ambulatory Visit: Payer: Self-pay | Admitting: Psychiatry

## 2017-04-20 ENCOUNTER — Telehealth: Payer: Self-pay

## 2017-04-22 ENCOUNTER — Emergency Department: Payer: Medicare Other

## 2017-04-22 ENCOUNTER — Encounter: Payer: Self-pay | Admitting: Emergency Medicine

## 2017-04-22 ENCOUNTER — Emergency Department
Admission: EM | Admit: 2017-04-22 | Discharge: 2017-04-22 | Disposition: A | Discharge status: Home / Self Care | Payer: Medicare Other | Attending: Emergency Medicine | Admitting: Emergency Medicine

## 2017-04-22 DIAGNOSIS — R51 Headache: Secondary | ICD-10-CM | POA: Insufficient documentation

## 2017-04-22 DIAGNOSIS — N189 Chronic kidney disease, unspecified: Secondary | ICD-10-CM | POA: Insufficient documentation

## 2017-04-22 DIAGNOSIS — I129 Hypertensive chronic kidney disease with stage 1 through stage 4 chronic kidney disease, or unspecified chronic kidney disease: Secondary | ICD-10-CM | POA: Insufficient documentation

## 2017-04-22 DIAGNOSIS — I2510 Atherosclerotic heart disease of native coronary artery without angina pectoris: Secondary | ICD-10-CM | POA: Insufficient documentation

## 2017-04-22 DIAGNOSIS — F17210 Nicotine dependence, cigarettes, uncomplicated: Secondary | ICD-10-CM | POA: Insufficient documentation

## 2017-04-22 DIAGNOSIS — E1122 Type 2 diabetes mellitus with diabetic chronic kidney disease: Secondary | ICD-10-CM | POA: Diagnosis not present

## 2017-04-22 DIAGNOSIS — F32A Depression, unspecified: Secondary | ICD-10-CM

## 2017-04-22 DIAGNOSIS — J45909 Unspecified asthma, uncomplicated: Secondary | ICD-10-CM | POA: Insufficient documentation

## 2017-04-22 DIAGNOSIS — F329 Major depressive disorder, single episode, unspecified: Secondary | ICD-10-CM | POA: Insufficient documentation

## 2017-04-22 DIAGNOSIS — R45851 Suicidal ideations: Secondary | ICD-10-CM | POA: Diagnosis not present

## 2017-04-22 LAB — COMPREHENSIVE METABOLIC PANEL
ALBUMIN: 4.4 g/dL (ref 3.5–5.0)
ALT: 35 U/L (ref 17–63)
ANION GAP: 11 (ref 5–15)
AST: 30 U/L (ref 15–41)
Alkaline Phosphatase: 134 U/L — ABNORMAL HIGH (ref 38–126)
BILIRUBIN TOTAL: 0.2 mg/dL — AB (ref 0.3–1.2)
BUN: 7 mg/dL (ref 6–20)
CHLORIDE: 106 mmol/L (ref 101–111)
CO2: 20 mmol/L — AB (ref 22–32)
Calcium: 9.2 mg/dL (ref 8.9–10.3)
Creatinine, Ser: 1.02 mg/dL (ref 0.61–1.24)
GFR calc Af Amer: >60 mL/min (ref >60)
GFR calc non Af Amer: >60 mL/min (ref >60)
GLUCOSE: 229 mg/dL — AB (ref 65–99)
POTASSIUM: 3.9 mmol/L (ref 3.5–5.1)
SODIUM: 137 mmol/L (ref 135–145)
TOTAL PROTEIN: 7.6 g/dL (ref 6.5–8.1)

## 2017-04-22 LAB — URINE DRUG SCREEN, QUALITATIVE (ARMC ONLY)
AMPHETAMINES, UR SCREEN: NOT DETECTED
BENZODIAZEPINE, UR SCRN: NOT DETECTED
Barbiturates, Ur Screen: NOT DETECTED
Cannabinoid 50 Ng, Ur ~~LOC~~: NOT DETECTED
Cocaine Metabolite,Ur ~~LOC~~: NOT DETECTED
MDMA (ECSTASY) UR SCREEN: NOT DETECTED
METHADONE SCREEN, URINE: NOT DETECTED
OPIATE, UR SCREEN: NOT DETECTED
Phencyclidine (PCP) Ur S: NOT DETECTED
Tricyclic, Ur Screen: POSITIVE — AB

## 2017-04-22 LAB — CBC
HEMATOCRIT: 46.7 % (ref 40.0–52.0)
Hemoglobin: 15.9 g/dL (ref 13.0–18.0)
MCH: 30.7 pg (ref 26.0–34.0)
MCHC: 34 g/dL (ref 32.0–36.0)
MCV: 90.3 fL (ref 80.0–100.0)
PLATELETS: 264 10*3/uL (ref 150–440)
RBC: 5.17 MIL/uL (ref 4.40–5.90)
RDW: 13.3 % (ref 11.5–14.5)
WBC: 7.6 10*3/uL (ref 3.8–10.6)

## 2017-04-22 LAB — ACETAMINOPHEN LEVEL

## 2017-04-22 LAB — ETHANOL: Alcohol, Ethyl (B): <10 mg/dL (ref <10)

## 2017-04-22 LAB — SALICYLATE LEVEL: Salicylate Lvl: 17.6 mg/dL (ref 2.8–30.0)

## 2017-04-22 MED ORDER — TRAZODONE HCL 100 MG PO TABS: 100.0000 mg | ORAL_TABLET | Freq: Every evening | ORAL | Status: DC | PRN

## 2017-04-22 MED ORDER — CLOPIDOGREL BISULFATE 75 MG PO TABS
75.0000 mg | ORAL_TABLET | Freq: Every day | ORAL | Status: DC
  Administered 2017-04-22: 75 mg via ORAL
  Filled 2017-04-22: qty 1

## 2017-04-22 MED ORDER — CARVEDILOL 6.25 MG PO TABS
6.2500 mg | ORAL_TABLET | Freq: Two times a day (BID) | ORAL | Status: DC
  Administered 2017-04-22: 6.25 mg via ORAL
  Filled 2017-04-22 (×2): qty 1

## 2017-04-22 MED ORDER — HYDROXYZINE HCL 25 MG PO TABS: 50.0000 mg | ORAL_TABLET | Freq: Three times a day (TID) | ORAL | Status: DC | PRN

## 2017-04-22 MED ORDER — QUETIAPINE FUMARATE 25 MG PO TABS: 300.0000 mg | ORAL_TABLET | Freq: Every day | ORAL | Status: DC

## 2017-04-22 MED ORDER — LORAZEPAM 1 MG PO TABS
ORAL_TABLET | ORAL | Status: AC
  Filled 2017-04-22: qty 2

## 2017-04-22 MED ORDER — ATORVASTATIN CALCIUM 20 MG PO TABS
80.0000 mg | ORAL_TABLET | Freq: Every day | ORAL | Status: DC
  Administered 2017-04-22: 80 mg via ORAL
  Filled 2017-04-22 (×2): qty 4

## 2017-04-22 MED ORDER — PROCHLORPERAZINE EDISYLATE 5 MG/ML IJ SOLN
10.0000 mg | Freq: Once | INTRAMUSCULAR | Status: AC
  Administered 2017-04-22: 10 mg via INTRAVENOUS
  Filled 2017-04-22: qty 2

## 2017-04-22 MED ORDER — MIRTAZAPINE 15 MG PO TABS: 45.0000 mg | ORAL_TABLET | Freq: Every day | ORAL | Status: DC

## 2017-04-22 MED ORDER — ESCITALOPRAM OXALATE 10 MG PO TABS
20.0000 mg | ORAL_TABLET | Freq: Every day | ORAL | Status: DC
  Administered 2017-04-22: 20 mg via ORAL
  Filled 2017-04-22: qty 2

## 2017-04-22 MED ORDER — ASPIRIN EC 81 MG PO TBEC
81.0000 mg | DELAYED_RELEASE_TABLET | Freq: Every day | ORAL | Status: DC
  Administered 2017-04-22: 81 mg via ORAL
  Filled 2017-04-22: qty 1

## 2017-04-22 MED ORDER — LURASIDONE HCL 60 MG PO TABS: 60.0000 mg | ORAL_TABLET | Freq: Every day | ORAL | Status: DC

## 2017-04-22 MED ORDER — LORAZEPAM 2 MG PO TABS
2.0000 mg | ORAL_TABLET | Freq: Once | ORAL | Status: AC
  Administered 2017-04-22: 2 mg via ORAL

## 2017-04-22 MED ORDER — ZOLPIDEM TARTRATE 5 MG PO TABS: 10.0000 mg | ORAL_TABLET | Freq: Every day | ORAL | Status: DC

## 2017-04-22 MED ORDER — PRAZOSIN HCL 2 MG PO CAPS
2.0000 mg | ORAL_CAPSULE | Freq: Every day | ORAL | Status: DC
  Filled 2017-04-22: qty 1

## 2017-04-22 MED ORDER — METFORMIN HCL 500 MG PO TABS
500.0000 mg | ORAL_TABLET | Freq: Two times a day (BID) | ORAL | Status: DC
  Administered 2017-04-22: 500 mg via ORAL
  Filled 2017-04-22: qty 1

## 2017-04-22 NOTE — ED Notes (Signed)
IVC, RN and ODS informed

## 2017-04-22 NOTE — ED Provider Notes (Signed)
[facility] Emergency Department Provider Note   ____________________________________________   First MD Initiated Contact with Patient 04/22/17 1244     (approximate)  I have reviewed the triage vital signs and the nursing notes.   HISTORY  Chief Complaint Suicidal    HPI Michael Schmitt is a 47 y.o. male Who comes in complaining of suicidality. He says he has a bad throbbing headache he's been getting frequently. He has had them in the past but not for a while. He is wondering if they're related to his ECT. They make him somewhat nauseated as well. He says like I just wanted diet for have to live this way don't want to be here. He has frequent episodes of suicidality.   Past Medical History:  Diagnosis Date  . Anxiety   . Anxiety   . Arthritis    cerv. stenosis, spondylosis, HNP- lower back , has been followed in pain clinic, has  had injection s in cerv. area  . Blood dyscrasia    told that when he was young he was a" free bleeder"  . CAD (coronary artery disease)   . Cervical spondylosis without myelopathy 07/24/2014  . Cervicogenic headache 07/24/2014  . Chronic kidney disease    renal calculi- passed spontaneously  . Depression   . Diabetes mellitus without complication ([place])   . Fatty liver   . GERD (gastroesophageal reflux disease)   . Headache(784.0)   . Hyperlipidemia   . Hypertension   . Mental disorder   . MI, old   . RLS (restless legs syndrome)    detected on sleep study  . Shortness of breath   . Ventricular fibrillation ([place]) 04/05/2016    Patient Active Problem List   Diagnosis Date Noted  . Severe recurrent major depression without psychotic features ([place]) 03/12/2017  . Generalized anxiety disorder 02/05/2017  . Chronic neck pain (Primary Area of Pain) (Left) 11/22/2016  . Cervical facet syndrome (Left) 11/22/2016  . Cervical radiculitis (Left) 11/22/2016  . History of cervical spinal surgery 11/22/2016  . Cervical  spondylosis 11/22/2016  . Chronic shoulder pain (Secondary Area of Pain) (Left) 11/22/2016  . Arthropathy of shoulder (Left) 11/22/2016  . Chronic low back pain [facility] Area of Pain) (Bilateral) (L>R) 11/22/2016  . Lumbar facet syndrome (Bilateral) (L>R) 11/22/2016  . Chronic pain syndrome 11/21/2016  . Long term prescription benzodiazepine use 11/21/2016  . Opiate use 11/21/2016  . Uncontrolled type 2 diabetes mellitus without complication, without long-term current use of insulin ([place]) 10/19/2016  . Chronic Suicidal ideation 09/15/2016  . Coronary artery disease 09/12/2016  . HTN (hypertension) 06/26/2016  . GERD (gastroesophageal reflux disease) 06/26/2016  . Alcohol use disorder, severe, 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[place]) 06/26/2016  . Moderate benzodiazepine use disorder ([place]) 06/11/2016  . Sedative, hypnotic or anxiolytic use disorder, severe, dependence ([place]) 05/12/2016  . Ventricular fibrillation ([place]) 04/05/2016  . Combined hyperlipidemia 01/05/2016  . Cervical spondylosis without myelopathy 07/24/2014  . Severe episode of recurrent major depressive disorder, without psychotic features ([place]) 04/15/2013  . Tobacco use disorder 10/20/2010  . Asthma 10/20/2010    Past Surgical History:  Procedure Laterality Date  . ANTERIOR CERVICAL DECOMP/DISCECTOMY FUSION  11/13/2011   Procedure: ANTERIOR CERVICAL DECOMPRESSION/DISCECTOMY FUSION 1 LEVEL;  Surgeon: [name], MD;  Location: [place] NEURO ORS;  Service: Neurosurgery;  Laterality: N/A;  Anterior Cervical Decompression/discectomy Fusion. Cervical three-four.  Marland Kitchen CARDIAC CATHETERIZATION N/A 01/01/2015   Procedure: Left Heart Cath and Coronary Angiography;  Surgeon: [name],  MD;  Location: [place] CV LAB;  Service: Cardiovascular;  Laterality: N/A;  . CARDIAC CATHETERIZATION N/A 04/05/2016   Procedure: Left Heart Cath and Coronary Angiography;  Surgeon: Belva Crome, MD;  Location: [place] CV LAB;  Service: Cardiovascular;   Laterality: N/A;  . CARDIAC CATHETERIZATION N/A 04/05/2016   Procedure: Coronary Stent Intervention;  Surgeon: Belva Crome, MD;  Location: [place] CV LAB;  Service: Cardiovascular;  Laterality: N/A;  . CARDIAC CATHETERIZATION N/A 04/05/2016   Procedure: Intravascular Ultrasound/IVUS;  Surgeon: Belva Crome, MD;  Location: [place] CV LAB;  Service: Cardiovascular;  Laterality: N/A;  . NASAL SINUS SURGERY     2005    Prior to Admission medications   Medication Sig Start Date End Date Taking? Authorizing Provider  aspirin EC 81 MG EC tablet Take 1 tablet (81 mg total) by mouth daily. 04/03/17   Clapacs, Madie Reno, MD  atorvastatin (LIPITOR) 80 MG tablet Take 1 tablet (80 mg total) by mouth daily at 6 PM. 04/02/17   Clapacs, Madie Reno, MD  atorvastatin (LIPITOR) 80 MG tablet Take 1 tablet (80 mg total) by mouth daily at 6 PM. 04/16/17   Clapacs, Madie Reno, MD  carvedilol (COREG) 6.25 MG tablet Take 1 tablet (6.25 mg total) by mouth 2 (two) times daily with a meal. 04/16/17   Clapacs, Madie Reno, MD  clopidogrel (PLAVIX) 75 MG tablet Take 1 tablet (75 mg total) by mouth daily. 04/17/17   Clapacs, Madie Reno, MD  escitalopram (LEXAPRO) 20 MG tablet Take 1 tablet (20 mg total) by mouth daily. 04/17/17   Clapacs, Madie Reno, MD  hydrOXYzine (ATARAX/VISTARIL) 50 MG tablet Take 1 tablet (50 mg total) by mouth 3 (three) times daily as needed for anxiety. 04/16/17   Clapacs, Madie Reno, MD  Lurasidone HCl 60 MG TABS Take 1 tablet (60 mg total) by mouth daily with breakfast. 04/16/17   Clapacs, Madie Reno, MD  metFORMIN (GLUCOPHAGE) 500 MG tablet Take 1 tablet (500 mg total) by mouth 2 (two) times daily with a meal. 04/16/17   Clapacs, Madie Reno, MD  mirtazapine (REMERON) 45 MG tablet Take 1 tablet (45 mg total) by mouth at bedtime. 04/16/17   Clapacs, Madie Reno, MD  nitroGLYCERIN (NITROSTAT) 0.4 MG SL tablet Place 1 tablet (0.4 mg total) under the tongue every 5 (five) minutes x 3 doses as needed. 09/14/16 09/14/17  Hildred Priest, MD   prazosin (MINIPRESS) 2 MG capsule Take 1 capsule (2 mg total) by mouth at bedtime. 04/16/17   Clapacs, Madie Reno, MD  QUEtiapine (SEROQUEL) 300 MG tablet Take 1 tablet (300 mg total) by mouth at bedtime. 04/16/17   Clapacs, Madie Reno, MD  traZODone (DESYREL) 100 MG tablet Take 1 tablet (100 mg total) by mouth at bedtime as needed for sleep. 04/16/17   Clapacs, Madie Reno, MD  zolpidem (AMBIEN) 10 MG tablet Take 1 tablet (10 mg total) by mouth at bedtime. 04/16/17   Clapacs, Madie Reno, MD    Allergies Prednisone; Hydrocodone; and Varenicline  Family History  Problem Relation Age of Onset  . Prostate cancer Father   . Hypertension Mother   . Kidney Stones Mother   . Anxiety disorder Mother   . Depression Mother   . COPD Sister   . Hypertension Sister   . Diabetes Sister   . Depression Sister   . Anxiety disorder Sister   . Seizures Sister   . ADD / ADHD Son   . ADD / ADHD Daughter  Social History Social History   Tobacco Use  . Smoking status: Current Every Day Smoker    Packs/day: 2.00    Years: 17.00    Pack years: 34.00    Types: Cigarettes, Cigars  . Smokeless tobacco: Former Systems developer  . Tobacco comment: occassional snuff   Substance Use Topics  . Alcohol use: No    Alcohol/week: 0.0 oz    Comment: last drinked in 3 months.   . Drug use: No    Review of Systems  Constitutional: No fever/chills Eyes: No visual changes. ENT: No sore throat. Cardiovascular: Denies chest pain. Respiratory: Denies shortness of breath. Gastrointestinal: No abdominal pain.  No nausea, no vomiting.  No diarrhea.  No constipation. Genitourinary: Negative for dysuria. Musculoskeletal: Negative for back pain. Skin: Negative for rash. Neurological: Negative for headaches, focal weakness  ____________________________________________   PHYSICAL EXAM:  VITAL SIGNS: ED Triage Vitals  Enc Vitals Group     BP 04/22/17 1232 (!) 185/110     Pulse Rate 04/22/17 1232 (!) 115     Resp 04/22/17 1232 (!)  24     Temp 04/22/17 1232 98 F (36.7 C)     Temp Source 04/22/17 1232 Oral     SpO2 04/22/17 1232 99 %     Weight 04/22/17 1233 195 lb (88.5 kg)     Height 04/22/17 1233 6\' 2"  (1.88 m)     Head Circumference --      Peak Flow --      Pain Score 04/22/17 1233 10     Pain Loc --      Pain Edu? --      Excl. in [place]? --     Constitutional: Alert and oriented. Well appearing and in no acute distress. Eyes: Conjunctivae are normal.pupils are equal and round extraocular movements are intact Head: Atraumatic. Nose: No congestion/rhinnorhea. Mouth/Throat: Mucous membranes are moist.  Oropharynx non-erythematous. Neck: No stridor.  Cardiovascular: Normal rate, regular rhythm. Grossly normal heart sounds.  Good peripheral circulation. Respiratory: Normal respiratory effort.  No retractions. Lungs CTAB. Gastrointestinal: Soft and nontender. No distention. No abdominal bruits. No CVA tenderness. Musculoskeletal: No lower extremity tenderness nor edema.  No joint effusions. Neurologic:  Normal speech and language. No gross focal neurologic deficits are appreciated. specifically cranial nerves II through XII are intact, visual fields were not checked cerebellar finger-nose rapid alternating movements and hands are normal motor strength is 5 over 5 throughout patient does not complain of any numbness Skin:  Skin is warm, dry and intact. No rash noted. Psychiatric: Mood and affect are normal. Speech and behavior are normal.  ____________________________________________   LABS (all labs ordered are listed, but only abnormal results are displayed)  Labs Reviewed  COMPREHENSIVE METABOLIC PANEL - Abnormal; Notable for the following components:      Result Value   CO2 20 (*)    Glucose, Bld 229 (*)    Alkaline Phosphatase 134 (*)    Total Bilirubin 0.2 (*)    All other components within normal limits  ACETAMINOPHEN LEVEL - Abnormal; Notable for the following components:   Acetaminophen  (Tylenol), Serum <10 (*)    All other components within normal limits  URINE DRUG SCREEN, QUALITATIVE ([facility] ONLY) - Abnormal; Notable for the following components:   Tricyclic, Ur Screen POSITIVE (*)    All other components within normal limits  ETHANOL  SALICYLATE LEVEL  CBC   ____________________________________________  EKG   ____________________________________________  RADIOLOGY  ____________________________________________   PROCEDURES  Procedure(s)  performed:  Procedures  Critical Care performed:  ____________________________________________   INITIAL IMPRESSION / ASSESSMENT AND PLAN / ED COURSE           ____________________________________________   FINAL CLINICAL IMPRESSION(S) / ED DIAGNOSES  Final diagnoses:  Suicidal ideation     ED Discharge Orders    None       Note:  This document was prepared using Dragon voice recognition software and may include unintentional dictation errors.    Nena Polio, MD 04/22/17 [phone]

## 2017-04-22 NOTE — BH Assessment (Signed)
Writer unable to complete TTS Consult. Patient discharged before writer was able to see him. Patient seen by [facility] and recommended for discharged.

## 2017-04-22 NOTE — ED Notes (Signed)
Pt discharged to sister in lobby. Discharge paperwork reviewed with patient. All belongings returned to patient. Pt denies SI.

## 2017-04-22 NOTE — ED Notes (Signed)
WALLET GIVEN TO PTS SISTER

## 2017-04-22 NOTE — ED Triage Notes (Signed)
Pt comes into the ED via POV c/o suicidal ideation.  Patient states he just left the downstairs facility a couple of days ago and is still having bad thoughts.  Patient states he also feels like hurting other people but does not have nay specific plans.  Patient presents very anxious at this time and is screaming in the triage room.  Patient is still cooperating at this time.

## 2017-04-22 NOTE — ED Notes (Signed)
Headache is better per pt

## 2017-04-22 NOTE — ED Notes (Signed)
Pt speaking with SOC. 

## 2017-04-22 NOTE — ED Notes (Addendum)
Pt pacing dayroom, waiting for discharge papers. Maintained on 15 minute checks and observation by security camera for safety.

## 2017-04-22 NOTE — ED Notes (Signed)
Patient currently making the statement "I just want to die, I'm tired of feeling this way".

## 2017-04-22 NOTE — ED Notes (Signed)
SOC to recommend patient be discharged.

## 2017-04-22 NOTE — Discharge Instructions (Signed)
Please seek medical attention and help for any thoughts about wanting to harm yourself, harm others, any concerning change in behavior, severe depression, inappropriate drug use or any other new or concerning symptoms. ° °

## 2017-04-22 NOTE — ED Notes (Signed)
Pt calm and cooperative with staff. Pt endorsees SI and feelings of depression.  Pt also complaining of headaches. RN recommended pt tell [name] about his headaches in case they are related to ECT treatments. Maintained on 15 minute checks and observation by security camera for safety.

## 2017-04-22 NOTE — ED Provider Notes (Signed)
SOC has evaluated patient. They have rescinded IVC.    Nance Pear, MD 04/22/17 [phone]

## 2017-04-23 ENCOUNTER — Encounter: Payer: Self-pay | Admitting: Emergency Medicine

## 2017-04-23 ENCOUNTER — Other Ambulatory Visit: Payer: Self-pay

## 2017-04-23 ENCOUNTER — Emergency Department
Admission: EM | Admit: 2017-04-23 | Discharge: 2017-04-23 | Disposition: A | Discharge status: Home / Self Care | Payer: Medicare Other | Attending: Emergency Medicine | Admitting: Emergency Medicine

## 2017-04-23 DIAGNOSIS — Z7984 Long term (current) use of oral hypoglycemic drugs: Secondary | ICD-10-CM | POA: Diagnosis not present

## 2017-04-23 DIAGNOSIS — F419 Anxiety disorder, unspecified: Secondary | ICD-10-CM | POA: Insufficient documentation

## 2017-04-23 DIAGNOSIS — I2510 Atherosclerotic heart disease of native coronary artery without angina pectoris: Secondary | ICD-10-CM | POA: Insufficient documentation

## 2017-04-23 DIAGNOSIS — I252 Old myocardial infarction: Secondary | ICD-10-CM | POA: Diagnosis not present

## 2017-04-23 DIAGNOSIS — Z79899 Other long term (current) drug therapy: Secondary | ICD-10-CM | POA: Diagnosis not present

## 2017-04-23 DIAGNOSIS — F17210 Nicotine dependence, cigarettes, uncomplicated: Secondary | ICD-10-CM | POA: Insufficient documentation

## 2017-04-23 DIAGNOSIS — E119 Type 2 diabetes mellitus without complications: Secondary | ICD-10-CM | POA: Insufficient documentation

## 2017-04-23 DIAGNOSIS — N189 Chronic kidney disease, unspecified: Secondary | ICD-10-CM | POA: Diagnosis not present

## 2017-04-23 DIAGNOSIS — Z7902 Long term (current) use of antithrombotics/antiplatelets: Secondary | ICD-10-CM | POA: Diagnosis not present

## 2017-04-23 DIAGNOSIS — F99 Mental disorder, not otherwise specified: Secondary | ICD-10-CM | POA: Diagnosis present

## 2017-04-23 DIAGNOSIS — Z7982 Long term (current) use of aspirin: Secondary | ICD-10-CM | POA: Insufficient documentation

## 2017-04-23 DIAGNOSIS — I129 Hypertensive chronic kidney disease with stage 1 through stage 4 chronic kidney disease, or unspecified chronic kidney disease: Secondary | ICD-10-CM | POA: Insufficient documentation

## 2017-04-23 DIAGNOSIS — F329 Major depressive disorder, single episode, unspecified: Secondary | ICD-10-CM

## 2017-04-23 LAB — URINE DRUG SCREEN, QUALITATIVE (ARMC ONLY)
AMPHETAMINES, UR SCREEN: NOT DETECTED
Barbiturates, Ur Screen: NOT DETECTED
Benzodiazepine, Ur Scrn: NOT DETECTED
CANNABINOID 50 NG, UR ~~LOC~~: NOT DETECTED
COCAINE METABOLITE, UR ~~LOC~~: NOT DETECTED
MDMA (ECSTASY) UR SCREEN: NOT DETECTED
Methadone Scn, Ur: NOT DETECTED
Opiate, Ur Screen: NOT DETECTED
Phencyclidine (PCP) Ur S: NOT DETECTED
TRICYCLIC, UR SCREEN: POSITIVE — AB

## 2017-04-23 LAB — COMPREHENSIVE METABOLIC PANEL
ALT: 37 U/L (ref 17–63)
AST: 28 U/L (ref 15–41)
Albumin: 4.4 g/dL (ref 3.5–5.0)
Alkaline Phosphatase: 124 U/L (ref 38–126)
Anion gap: 10 (ref 5–15)
BUN: 12 mg/dL (ref 6–20)
CHLORIDE: 102 mmol/L (ref 101–111)
CO2: 25 mmol/L (ref 22–32)
CREATININE: 1.01 mg/dL (ref 0.61–1.24)
Calcium: 9.3 mg/dL (ref 8.9–10.3)
GFR calc Af Amer: >60 mL/min (ref >60)
GFR calc non Af Amer: >60 mL/min (ref >60)
Glucose, Bld: 180 mg/dL — ABNORMAL HIGH (ref 65–99)
Potassium: 4.2 mmol/L (ref 3.5–5.1)
Sodium: 137 mmol/L (ref 135–145)
Total Bilirubin: 0.5 mg/dL (ref 0.3–1.2)
Total Protein: 7.5 g/dL (ref 6.5–8.1)

## 2017-04-23 LAB — CBC
HCT: 46.5 % (ref 40.0–52.0)
HEMOGLOBIN: 15.5 g/dL (ref 13.0–18.0)
MCH: 30.3 pg (ref 26.0–34.0)
MCHC: 33.4 g/dL (ref 32.0–36.0)
MCV: 90.8 fL (ref 80.0–100.0)
Platelets: 239 10*3/uL (ref 150–440)
RBC: 5.12 MIL/uL (ref 4.40–5.90)
RDW: 13.4 % (ref 11.5–14.5)
WBC: 8 10*3/uL (ref 3.8–10.6)

## 2017-04-23 LAB — SALICYLATE LEVEL

## 2017-04-23 LAB — ACETAMINOPHEN LEVEL: Acetaminophen (Tylenol), Serum: <10 ug/mL — ABNORMAL LOW (ref 10–30)

## 2017-04-23 LAB — ETHANOL: Alcohol, Ethyl (B): <10 mg/dL (ref <10)

## 2017-04-23 MED ORDER — LORAZEPAM 2 MG PO TABS
2.0000 mg | ORAL_TABLET | Freq: Once | ORAL | Status: AC
  Administered 2017-04-23: 2 mg via ORAL
  Filled 2017-04-23: qty 1

## 2017-04-23 NOTE — ED Notes (Signed)
Patient dressed out , placed 1 pair of jeans, 1 black of  pair shoes, `1 teal colored shirt ,1 pair o f green underwear in bag patient going to 21 and clothes given to nurse Onalee Hua.

## 2017-04-23 NOTE — ED Provider Notes (Signed)
[facility] Emergency Department Provider Note       Time seen: ----------------------------------------- 11:40 AM on 04/23/2017 -----------------------------------------   I have reviewed the triage vital signs and the nursing notes.  HISTORY   Chief Complaint Suicidal    HPI Michael Schmitt is a 47 y.o. male with a history of anxiety, coronary artery disease, chronic kidney disease, hypertension and hyperlipidemia who presents to the ED for suicidal ideation.  Patient was seen here yesterday and reports he felt better so he went home.  He was evaluated by specialists on call in his involuntary commitment yesterday was overturned.  He denies homicidal ideation.  Patient is a chronic history of same.  Past Medical History:  Diagnosis Date  . Anxiety   . Anxiety   . Arthritis    cerv. stenosis, spondylosis, HNP- lower back , has been followed in pain clinic, has  had injection s in cerv. area  . Blood dyscrasia    told that when he was young he was a" free bleeder"  . CAD (coronary artery disease)   . Cervical spondylosis without myelopathy 07/24/2014  . Cervicogenic headache 07/24/2014  . Chronic kidney disease    renal calculi- passed spontaneously  . Depression   . Diabetes mellitus without complication ([place])   . Fatty liver   . GERD (gastroesophageal reflux disease)   . Headache(784.0)   . Hyperlipidemia   . Hypertension   . Mental disorder   . MI, old   . RLS (restless legs syndrome)    detected on sleep study  . Shortness of breath   . Ventricular fibrillation ([place]) 04/05/2016    Patient Active Problem List   Diagnosis Date Noted  . Severe recurrent major depression without psychotic features ([place]) 03/12/2017  . Generalized anxiety disorder 02/05/2017  . Chronic neck pain (Primary Area of Pain) (Left) 11/22/2016  . Cervical facet syndrome (Left) 11/22/2016  . Cervical radiculitis (Left) 11/22/2016  . History of cervical spinal  surgery 11/22/2016  . Cervical spondylosis 11/22/2016  . Chronic shoulder pain (Secondary Area of Pain) (Left) 11/22/2016  . Arthropathy of shoulder (Left) 11/22/2016  . Chronic low back pain [facility] Area of Pain) (Bilateral) (L>R) 11/22/2016  . Lumbar facet syndrome (Bilateral) (L>R) 11/22/2016  . Chronic pain syndrome 11/21/2016  . Long term prescription benzodiazepine use 11/21/2016  . Opiate use 11/21/2016  . Uncontrolled type 2 diabetes mellitus without complication, without long-term current use of insulin ([place]) 10/19/2016  . Chronic Suicidal ideation 09/15/2016  . Coronary artery disease 09/12/2016  . HTN (hypertension) 06/26/2016  . GERD (gastroesophageal reflux disease) 06/26/2016  . Alcohol use disorder, severe, 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[place]) 06/26/2016  . Moderate benzodiazepine use disorder ([place]) 06/11/2016  . Sedative, hypnotic or anxiolytic use disorder, severe, dependence ([place]) 05/12/2016  . Ventricular fibrillation ([place]) 04/05/2016  . Combined hyperlipidemia 01/05/2016  . Cervical spondylosis without myelopathy 07/24/2014  . Severe episode of recurrent major depressive disorder, without psychotic features ([place]) 04/15/2013  . Tobacco use disorder 10/20/2010  . Asthma 10/20/2010    Past Surgical History:  Procedure Laterality Date  . ANTERIOR CERVICAL DECOMP/DISCECTOMY FUSION  11/13/2011   Procedure: ANTERIOR CERVICAL DECOMPRESSION/DISCECTOMY FUSION 1 LEVEL;  Surgeon: [name], MD;  Location: [place] NEURO ORS;  Service: Neurosurgery;  Laterality: N/A;  Anterior Cervical Decompression/discectomy Fusion. Cervical three-four.  Marland Kitchen CARDIAC CATHETERIZATION N/A 01/01/2015   Procedure: Left Heart Cath and Coronary Angiography;  Surgeon: [name], MD;  Location: [place] CV LAB;  Service: Cardiovascular;  Laterality: N/A;  . CARDIAC CATHETERIZATION N/A 04/05/2016   Procedure: Left Heart Cath and Coronary Angiography;  Surgeon: Belva Crome, MD;  Location: [place] CV LAB;   Service: Cardiovascular;  Laterality: N/A;  . CARDIAC CATHETERIZATION N/A 04/05/2016   Procedure: Coronary Stent Intervention;  Surgeon: Belva Crome, MD;  Location: [place] CV LAB;  Service: Cardiovascular;  Laterality: N/A;  . CARDIAC CATHETERIZATION N/A 04/05/2016   Procedure: Intravascular Ultrasound/IVUS;  Surgeon: Belva Crome, MD;  Location: [place] CV LAB;  Service: Cardiovascular;  Laterality: N/A;  . NASAL SINUS SURGERY     2005    Allergies Prednisone; Hydrocodone; and Varenicline  Social History Social History   Tobacco Use  . Smoking status: Current Every Day Smoker    Packs/day: 2.00    Years: 17.00    Pack years: 34.00    Types: Cigarettes, Cigars  . Smokeless tobacco: Former Systems developer  . Tobacco comment: occassional snuff   Substance Use Topics  . Alcohol use: No    Alcohol/week: 0.0 oz    Comment: last drinked in 3 months.   . Drug use: No   Review of Systems Constitutional: Negative for fever. Cardiovascular: Negative for chest pain. Respiratory: Negative for shortness of breath. Gastrointestinal: Negative for abdominal pain, vomiting and diarrhea. Musculoskeletal: Negative for back pain. Skin: Negative for rash. Neurological: Negative for headaches, focal weakness or numbness. Psychiatric: Positive for depression, suicidal ideation All systems negative/normal/unremarkable except as stated in the HPI  ____________________________________________   PHYSICAL EXAM:  VITAL SIGNS: ED Triage Vitals  Enc Vitals Group     BP 04/23/17 1121 (!) 143/95     Pulse Rate 04/23/17 1121 94     Resp 04/23/17 1121 16     Temp 04/23/17 1121 98.4 F (36.9 C)     Temp Source 04/23/17 1121 Oral     SpO2 04/23/17 1121 96 %     Weight 04/23/17 1117 195 lb (88.5 kg)     Height 04/23/17 1117 6\' 2"  (1.88 m)     Head Circumference --      Peak Flow --      Pain Score --      Pain Loc --      Pain Edu? --      Excl. in [place]? --    Constitutional: Alert and  oriented. Well appearing and in no distress. Eyes: Conjunctivae are normal. Normal extraocular movements. ENT   Head: Normocephalic and atraumatic.   Nose: No congestion/rhinnorhea.   Mouth/Throat: Mucous membranes are moist.   Neck: No stridor. Cardiovascular: Normal rate, regular rhythm. No murmurs, rubs, or gallops. Respiratory: Normal respiratory effort without tachypnea nor retractions. Breath sounds are clear and equal bilaterally. No wheezes/rales/rhonchi. Gastrointestinal: Soft and nontender. Normal bowel sounds Musculoskeletal: Nontender with normal range of motion in extremities. No lower extremity tenderness nor edema. Neurologic:  Normal speech and language. No gross focal neurologic deficits are appreciated.  Skin:  Skin is warm, dry and intact. No rash noted. Psychiatric: Depressed mood and affect ____________________________________________  ED COURSE:  As part of my medical decision making, I reviewed the following data within the [place] History obtained from family if available, nursing notes, old chart and ekg, as well as notes from prior ED visits. Patient presented for depression and suicidal ideation which is chronic, frankly I am not sure there is anything else we can offer him.   Procedures ____________________________________________   LABS (pertinent positives/negatives)  Labs Reviewed  COMPREHENSIVE  METABOLIC PANEL  ETHANOL  SALICYLATE LEVEL  ACETAMINOPHEN LEVEL  CBC  URINE DRUG SCREEN, QUALITATIVE ([place])   ____________________________________________  DIFFERENTIAL DIAGNOSIS   Depression, suicidal ideation, substance abuse  FINAL ASSESSMENT AND PLAN  Depression   Plan: Patient had presented for depression.  He had made reference to suicidal thought which is a chronic issue for him.  He is currently undergoing ECT.  He was given a dose of Ativan here but do not feel like inpatient admission is necessary.  He  is stable for outpatient follow-up and continue his current regimen.   Earleen Newport, MD   Note: This note was generated in part or whole with voice recognition software. Voice recognition is usually quite accurate but there are transcription errors that can and very often do occur. I apologize for any typographical errors that were not detected and corrected.     Earleen Newport, MD 04/23/17 1147

## 2017-04-23 NOTE — ED Triage Notes (Signed)
FIRST NURSE NOTE-here for SI. Here and discharged yesterday for same. Sister with pt. Placed in chairs in triage wait with sister.

## 2017-04-23 NOTE — ED Triage Notes (Signed)
Here for SI. Was seen yesterday and reports felt better so went home.  No plan. Denies HI

## 2017-04-25 DIAGNOSIS — R000 Tachycardia, unspecified: Secondary | ICD-10-CM | POA: Diagnosis not present

## 2017-04-25 DIAGNOSIS — I517 Cardiomegaly: Secondary | ICD-10-CM | POA: Diagnosis not present

## 2017-04-25 DIAGNOSIS — Z7984 Long term (current) use of oral hypoglycemic drugs: Secondary | ICD-10-CM | POA: Diagnosis not present

## 2017-04-25 DIAGNOSIS — R82998 Other abnormal findings in urine: Secondary | ICD-10-CM | POA: Diagnosis not present

## 2017-04-25 DIAGNOSIS — Z79899 Other long term (current) drug therapy: Secondary | ICD-10-CM | POA: Diagnosis not present

## 2017-04-25 DIAGNOSIS — Z7902 Long term (current) use of antithrombotics/antiplatelets: Secondary | ICD-10-CM | POA: Diagnosis not present

## 2017-04-25 DIAGNOSIS — Z79891 Long term (current) use of opiate analgesic: Secondary | ICD-10-CM | POA: Diagnosis not present

## 2017-04-25 DIAGNOSIS — G8929 Other chronic pain: Secondary | ICD-10-CM | POA: Diagnosis not present

## 2017-04-25 DIAGNOSIS — M542 Cervicalgia: Secondary | ICD-10-CM | POA: Diagnosis not present

## 2017-04-25 DIAGNOSIS — R7989 Other specified abnormal findings of blood chemistry: Secondary | ICD-10-CM | POA: Diagnosis not present

## 2017-05-28 DIAGNOSIS — M1990 Unspecified osteoarthritis, unspecified site: Secondary | ICD-10-CM | POA: Diagnosis not present

## 2017-05-28 DIAGNOSIS — I2109 ST elevation (STEMI) myocardial infarction involving other coronary artery of anterior wall: Secondary | ICD-10-CM | POA: Diagnosis not present

## 2017-05-28 DIAGNOSIS — Z79899 Other long term (current) drug therapy: Secondary | ICD-10-CM | POA: Diagnosis not present

## 2017-05-28 DIAGNOSIS — Z825 Family history of asthma and other chronic lower respiratory diseases: Secondary | ICD-10-CM | POA: Diagnosis not present

## 2017-05-28 DIAGNOSIS — I252 Old myocardial infarction: Secondary | ICD-10-CM | POA: Diagnosis not present

## 2017-05-28 DIAGNOSIS — Z833 Family history of diabetes mellitus: Secondary | ICD-10-CM | POA: Diagnosis not present

## 2017-05-28 DIAGNOSIS — I499 Cardiac arrhythmia, unspecified: Secondary | ICD-10-CM | POA: Diagnosis not present

## 2017-05-28 DIAGNOSIS — Z9114 Patient's other noncompliance with medication regimen: Secondary | ICD-10-CM | POA: Diagnosis not present

## 2017-05-28 DIAGNOSIS — N189 Chronic kidney disease, unspecified: Secondary | ICD-10-CM | POA: Diagnosis not present

## 2017-05-28 DIAGNOSIS — Z7982 Long term (current) use of aspirin: Secondary | ICD-10-CM | POA: Diagnosis not present

## 2017-05-28 DIAGNOSIS — R451 Restlessness and agitation: Secondary | ICD-10-CM | POA: Diagnosis not present

## 2017-05-28 DIAGNOSIS — Z7984 Long term (current) use of oral hypoglycemic drugs: Secondary | ICD-10-CM | POA: Diagnosis not present

## 2017-05-28 DIAGNOSIS — I2510 Atherosclerotic heart disease of native coronary artery without angina pectoris: Secondary | ICD-10-CM | POA: Diagnosis not present

## 2017-05-28 DIAGNOSIS — Z823 Family history of stroke: Secondary | ICD-10-CM | POA: Diagnosis not present

## 2017-05-28 DIAGNOSIS — I129 Hypertensive chronic kidney disease with stage 1 through stage 4 chronic kidney disease, or unspecified chronic kidney disease: Secondary | ICD-10-CM | POA: Diagnosis not present

## 2017-05-28 DIAGNOSIS — E1122 Type 2 diabetes mellitus with diabetic chronic kidney disease: Secondary | ICD-10-CM | POA: Diagnosis not present

## 2017-05-31 DIAGNOSIS — I2510 Atherosclerotic heart disease of native coronary artery without angina pectoris: Secondary | ICD-10-CM | POA: Diagnosis not present

## 2017-05-31 DIAGNOSIS — E7849 Other hyperlipidemia: Secondary | ICD-10-CM | POA: Diagnosis not present

## 2017-05-31 DIAGNOSIS — I10 Essential (primary) hypertension: Secondary | ICD-10-CM | POA: Diagnosis not present

## 2017-05-31 DIAGNOSIS — I5022 Chronic systolic (congestive) heart failure: Secondary | ICD-10-CM | POA: Diagnosis not present

## 2017-05-31 DIAGNOSIS — E119 Type 2 diabetes mellitus without complications: Secondary | ICD-10-CM | POA: Diagnosis not present

## 2017-06-22 ENCOUNTER — Emergency Department
Admission: EM | Admit: 2017-06-22 | Discharge: 2017-06-22 | Disposition: A | Discharge status: Home / Self Care | Payer: Medicare Other | Attending: Physician Assistant | Admitting: Physician Assistant

## 2017-06-22 ENCOUNTER — Other Ambulatory Visit: Payer: Self-pay

## 2017-06-22 ENCOUNTER — Encounter: Payer: Self-pay | Admitting: Emergency Medicine

## 2017-06-22 DIAGNOSIS — N189 Chronic kidney disease, unspecified: Secondary | ICD-10-CM | POA: Insufficient documentation

## 2017-06-22 DIAGNOSIS — F332 Major depressive disorder, recurrent severe without psychotic features: Secondary | ICD-10-CM | POA: Diagnosis not present

## 2017-06-22 DIAGNOSIS — I129 Hypertensive chronic kidney disease with stage 1 through stage 4 chronic kidney disease, or unspecified chronic kidney disease: Secondary | ICD-10-CM | POA: Diagnosis not present

## 2017-06-22 DIAGNOSIS — R451 Restlessness and agitation: Secondary | ICD-10-CM | POA: Diagnosis not present

## 2017-06-22 DIAGNOSIS — Z7982 Long term (current) use of aspirin: Secondary | ICD-10-CM | POA: Insufficient documentation

## 2017-06-22 DIAGNOSIS — E1122 Type 2 diabetes mellitus with diabetic chronic kidney disease: Secondary | ICD-10-CM | POA: Diagnosis not present

## 2017-06-22 DIAGNOSIS — Z7902 Long term (current) use of antithrombotics/antiplatelets: Secondary | ICD-10-CM | POA: Diagnosis not present

## 2017-06-22 DIAGNOSIS — I10 Essential (primary) hypertension: Secondary | ICD-10-CM | POA: Diagnosis not present

## 2017-06-22 DIAGNOSIS — F17210 Nicotine dependence, cigarettes, uncomplicated: Secondary | ICD-10-CM | POA: Diagnosis not present

## 2017-06-22 DIAGNOSIS — I2510 Atherosclerotic heart disease of native coronary artery without angina pectoris: Secondary | ICD-10-CM | POA: Insufficient documentation

## 2017-06-22 DIAGNOSIS — R45851 Suicidal ideations: Secondary | ICD-10-CM | POA: Diagnosis not present

## 2017-06-22 LAB — CBC
HCT: 43.4 % (ref 40.0–52.0)
Hemoglobin: 14.1 g/dL (ref 13.0–18.0)
MCH: 29.8 pg (ref 26.0–34.0)
MCHC: 32.6 g/dL (ref 32.0–36.0)
MCV: 91.5 fL (ref 80.0–100.0)
PLATELETS: 285 10*3/uL (ref 150–440)
RBC: 4.74 MIL/uL (ref 4.40–5.90)
RDW: 14.7 % — AB (ref 11.5–14.5)
WBC: 7.4 10*3/uL (ref 3.8–10.6)

## 2017-06-22 LAB — COMPREHENSIVE METABOLIC PANEL
ALK PHOS: 77 U/L (ref 38–126)
ALT: 18 U/L (ref 17–63)
ANION GAP: 10 (ref 5–15)
AST: 26 U/L (ref 15–41)
Albumin: 4.4 g/dL (ref 3.5–5.0)
BUN: 11 mg/dL (ref 6–20)
CALCIUM: 9 mg/dL (ref 8.9–10.3)
CO2: 23 mmol/L (ref 22–32)
CREATININE: 0.85 mg/dL (ref 0.61–1.24)
Chloride: 107 mmol/L (ref 101–111)
Glucose, Bld: 109 mg/dL — ABNORMAL HIGH (ref 65–99)
Potassium: 4.1 mmol/L (ref 3.5–5.1)
SODIUM: 140 mmol/L (ref 135–145)
TOTAL PROTEIN: 7.7 g/dL (ref 6.5–8.1)
Total Bilirubin: 0.4 mg/dL (ref 0.3–1.2)

## 2017-06-22 LAB — URINE DRUG SCREEN, QUALITATIVE (ARMC ONLY)
Amphetamines, Ur Screen: NOT DETECTED
BARBITURATES, UR SCREEN: NOT DETECTED
BENZODIAZEPINE, UR SCRN: NOT DETECTED
CANNABINOID 50 NG, UR ~~LOC~~: NOT DETECTED
Cocaine Metabolite,Ur ~~LOC~~: NOT DETECTED
MDMA (Ecstasy)Ur Screen: NOT DETECTED
METHADONE SCREEN, URINE: NOT DETECTED
Opiate, Ur Screen: NOT DETECTED
Phencyclidine (PCP) Ur S: NOT DETECTED
TRICYCLIC, UR SCREEN: NOT DETECTED

## 2017-06-22 LAB — URINALYSIS, COMPLETE (UACMP) WITH MICROSCOPIC
BACTERIA UA: NONE SEEN
BILIRUBIN URINE: NEGATIVE
GLUCOSE, UA: NEGATIVE mg/dL
Hgb urine dipstick: NEGATIVE
KETONES UR: NEGATIVE mg/dL
LEUKOCYTES UA: NEGATIVE
NITRITE: NEGATIVE
PH: 6 (ref 5.0–8.0)
Protein, ur: NEGATIVE mg/dL
RBC / HPF: NONE SEEN RBC/hpf (ref 0–5)
SPECIFIC GRAVITY, URINE: 1.003 — AB (ref 1.005–1.030)
Squamous Epithelial / LPF: NONE SEEN

## 2017-06-22 LAB — ACETAMINOPHEN LEVEL

## 2017-06-22 LAB — SALICYLATE LEVEL

## 2017-06-22 LAB — ETHANOL

## 2017-06-22 MED ORDER — LORAZEPAM 2 MG/ML IJ SOLN
2.0000 mg | Freq: Once | INTRAMUSCULAR | Status: AC
  Administered 2017-06-22: 2 mg via INTRAMUSCULAR

## 2017-06-22 MED ORDER — LORAZEPAM 2 MG/ML IJ SOLN
INTRAMUSCULAR | Status: AC
  Filled 2017-06-22: qty 1

## 2017-06-22 MED ORDER — LORAZEPAM 2 MG PO TABS
2.0000 mg | ORAL_TABLET | Freq: Once | ORAL | Status: AC
  Administered 2017-06-22: 2 mg via ORAL

## 2017-06-22 MED ORDER — ZIPRASIDONE MESYLATE 20 MG IM SOLR
10.0000 mg | Freq: Once | INTRAMUSCULAR | Status: AC
  Administered 2017-06-22: 10 mg via INTRAMUSCULAR

## 2017-06-22 MED ORDER — LORAZEPAM 1 MG PO TABS
ORAL_TABLET | ORAL | Status: AC
  Filled 2017-06-22: qty 2

## 2017-06-22 MED ORDER — ZIPRASIDONE MESYLATE 20 MG IM SOLR
INTRAMUSCULAR | Status: AC
  Filled 2017-06-22: qty 20

## 2017-06-22 NOTE — ED Provider Notes (Signed)
[facility] Emergency Department Provider Note ____________________________________________   I have reviewed the triage vital signs and the triage nursing note.  HISTORY  Chief Complaint Suicidal   Historian Level 5 Caveat History Limited by severe psychiatric illness  HPI Michael Schmitt is a 47 y.o. male presents with BPD with involuntary papers for suicidal ideation.  He has a history of anxiety and depression and prior psychiatric admission.  Denies any overdose or injury, however he does keep saying "I just want to die.  "He is also at times slapping himself in the face.  Denies hearing voices.  Symptoms are severe.     Past Medical History:  Diagnosis Date  . Anxiety   . Anxiety   . Arthritis    cerv. stenosis, spondylosis, HNP- lower back , has been followed in pain clinic, has  had injection s in cerv. area  . Blood dyscrasia    told that when he was young he was a" free bleeder"  . CAD (coronary artery disease)   . Cervical spondylosis without myelopathy 07/24/2014  . Cervicogenic headache 07/24/2014  . Chronic kidney disease    renal calculi- passed spontaneously  . Depression   . Diabetes mellitus without complication ([place])   . Fatty liver   . GERD (gastroesophageal reflux disease)   . Headache(784.0)   . Hyperlipidemia   . Hypertension   . Mental disorder   . MI, old   . RLS (restless legs syndrome)    detected on sleep study  . Shortness of breath   . Ventricular fibrillation ([place]) 04/05/2016    Patient Active Problem List   Diagnosis Date Noted  . Severe recurrent major depression without psychotic features ([place]) 03/12/2017  . Generalized anxiety disorder 02/05/2017  . Chronic neck pain (Primary Area of Pain) (Left) 11/22/2016  . Cervical facet syndrome (Left) 11/22/2016  . Cervical radiculitis (Left) 11/22/2016  . History of cervical spinal surgery 11/22/2016  . Cervical spondylosis 11/22/2016  . Chronic shoulder  pain (Secondary Area of Pain) (Left) 11/22/2016  . Arthropathy of shoulder (Left) 11/22/2016  . Chronic low back pain [facility] Area of Pain) (Bilateral) (L>R) 11/22/2016  . Lumbar facet syndrome (Bilateral) (L>R) 11/22/2016  . Chronic pain syndrome 11/21/2016  . Long term prescription benzodiazepine use 11/21/2016  . Opiate use 11/21/2016  . Uncontrolled type 2 diabetes mellitus without complication, without long-term current use of insulin ([place]) 10/19/2016  . Chronic Suicidal ideation 09/15/2016  . Coronary artery disease 09/12/2016  . HTN (hypertension) 06/26/2016  . GERD (gastroesophageal reflux disease) 06/26/2016  . Alcohol use disorder, severe, 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[place]) 06/26/2016  . Moderate benzodiazepine use disorder ([place]) 06/11/2016  . Sedative, hypnotic or anxiolytic use disorder, severe, dependence ([place]) 05/12/2016  . Ventricular fibrillation ([place]) 04/05/2016  . Combined hyperlipidemia 01/05/2016  . Cervical spondylosis without myelopathy 07/24/2014  . Severe episode of recurrent major depressive disorder, without psychotic features ([place]) 04/15/2013  . Tobacco use disorder 10/20/2010  . Asthma 10/20/2010    Past Surgical History:  Procedure Laterality Date  . ANTERIOR CERVICAL DECOMP/DISCECTOMY FUSION  11/13/2011   Procedure: ANTERIOR CERVICAL DECOMPRESSION/DISCECTOMY FUSION 1 LEVEL;  Surgeon: [name], MD;  Location: [facility];  Service: Neurosurgery;  Laterality: N/A;  Anterior Cervical Decompression/discectomy Fusion. Cervical three-four.  Marland Kitchen CARDIAC CATHETERIZATION N/A 01/01/2015   Procedure: Left Heart Cath and Coronary Angiography;  Surgeon: [name], MD;  Location: [place] [facility];  Service: Cardiovascular;  Laterality: N/A;  . CARDIAC CATHETERIZATION N/A  04/05/2016   Procedure: Left Heart Cath and Coronary Angiography;  Surgeon: Belva Crome, MD;  Location: [place] [facility];  Service: Cardiovascular;  Laterality: N/A;  . CARDIAC CATHETERIZATION N/A  04/05/2016   Procedure: Coronary Stent Intervention;  Surgeon: Belva Crome, MD;  Location: [place] [facility];  Service: Cardiovascular;  Laterality: N/A;  . CARDIAC CATHETERIZATION N/A 04/05/2016   Procedure: Intravascular Ultrasound/IVUS;  Surgeon: Belva Crome, MD;  Location: [place] [facility];  Service: Cardiovascular;  Laterality: N/A;  . NASAL SINUS SURGERY     2005    Prior to Admission medications   Medication Sig Start Date End Date Taking? Authorizing Provider  aspirin EC 81 MG EC tablet Take 1 tablet (81 mg total) by mouth daily. 04/03/17   Clapacs, Madie Reno, MD  atorvastatin (LIPITOR) 80 MG tablet Take 1 tablet (80 mg total) by mouth daily at 6 PM. 04/02/17   Clapacs, Madie Reno, MD  atorvastatin (LIPITOR) 80 MG tablet Take 1 tablet (80 mg total) by mouth daily at 6 PM. 04/16/17   Clapacs, Madie Reno, MD  carvedilol (COREG) 6.25 MG tablet Take 1 tablet (6.25 mg total) by mouth 2 (two) times daily with a meal. 04/16/17   Clapacs, Madie Reno, MD  clopidogrel (PLAVIX) 75 MG tablet Take 1 tablet (75 mg total) by mouth daily. 04/17/17   Clapacs, Madie Reno, MD  escitalopram (LEXAPRO) 20 MG tablet Take 1 tablet (20 mg total) by mouth daily. 04/17/17   Clapacs, Madie Reno, MD  hydrOXYzine (ATARAX/VISTARIL) 50 MG tablet Take 1 tablet (50 mg total) by mouth 3 (three) times daily as needed for anxiety. 04/16/17   Clapacs, Madie Reno, MD  Lurasidone HCl 60 MG TABS Take 1 tablet (60 mg total) by mouth daily with breakfast. 04/16/17   Clapacs, Madie Reno, MD  metFORMIN (GLUCOPHAGE) 500 MG tablet Take 1 tablet (500 mg total) by mouth 2 (two) times daily with a meal. 04/16/17   Clapacs, Madie Reno, MD  mirtazapine (REMERON) 45 MG tablet Take 1 tablet (45 mg total) by mouth at bedtime. 04/16/17   Clapacs, Madie Reno, MD  nitroGLYCERIN (NITROSTAT) 0.4 MG SL tablet Place 1 tablet (0.4 mg total) under the tongue every 5 (five) minutes x 3 doses as needed. 09/14/16 09/14/17  Hildred Priest, MD  prazosin (MINIPRESS) 2 MG capsule Take 1  capsule (2 mg total) by mouth at bedtime. 04/16/17   Clapacs, Madie Reno, MD  QUEtiapine (SEROQUEL) 300 MG tablet Take 1 tablet (300 mg total) by mouth at bedtime. 04/16/17   Clapacs, Madie Reno, MD  traZODone (DESYREL) 100 MG tablet Take 1 tablet (100 mg total) by mouth at bedtime as needed for sleep. 04/16/17   Clapacs, Madie Reno, MD  zolpidem (AMBIEN) 10 MG tablet Take 1 tablet (10 mg total) by mouth at bedtime. 04/16/17   Clapacs, Madie Reno, MD    Allergies  Allergen Reactions  . Prednisone Other (See Comments)    Hypertension, makes him feel spacey  . Hydrocodone Other (See Comments)    Headache, irritable  . Varenicline Other (See Comments)    Suicidal thoughts    Family History  Problem Relation Age of Onset  . Prostate cancer Father   . Hypertension Mother   . Kidney Stones Mother   . Anxiety disorder Mother   . Depression Mother   . COPD Sister   . Hypertension Sister   . Diabetes Sister   . Depression Sister   . Anxiety disorder Sister   .  Seizures Sister   . ADD / ADHD Son   . ADD / ADHD Daughter     Social History Social History   Tobacco Use  . Smoking status: Current Every Day Smoker    Packs/day: 2.00    Years: 17.00    Pack years: 34.00    Types: Cigarettes, Cigars  . Smokeless tobacco: Former Systems developer  . Tobacco comment: occassional snuff   Substance Use Topics  . Alcohol use: No    Alcohol/week: 0.0 oz    Comment: last drinked in 3 months.   . Drug use: No    Review of Systems  Denies chest pain or trouble breathing.    ____________________________________________   PHYSICAL EXAM:  VITAL SIGNS: ED Triage Vitals  Enc Vitals Group     BP 06/22/17 1058 (!) 155/94     Pulse Rate 06/22/17 1058 93     Resp 06/22/17 1058 16     Temp 06/22/17 1058 97.8 F (36.6 C)     Temp Source 06/22/17 1058 Oral     SpO2 06/22/17 1058 98 %     Weight 06/22/17 1056 204 lb (92.5 kg)     Height 06/22/17 1056 6\' 2"  (1.88 m)     Head Circumference --      Peak Flow --       Pain Score 06/22/17 1056 0     Pain Loc --      Pain Edu? --      Excl. in [place]? --      Constitutional: Alert and yelling out.  He is agitated. HEENT      Head: Normocephalic and atraumatic.      Eyes: Conjunctivae are normal. Pupils equal and round.       Ears:         Nose: No congestion/rhinnorhea.      Mouth/Throat: Mucous membranes are moist.      Neck: No stridor. Cardiovascular/Chest: Normal rate, regular rhythm.  No murmurs, rubs, or gallops. Respiratory: Normal respiratory effort without tachypnea nor retractions.  Breath sounds are clear and equal. Gastrointestinal: Soft. No distention, no guarding, no rebound. Nontender.    Genitourinary/rectal:Deferred Musculoskeletal: Moving 4 extremities. Neurologic: No facial droop.  No slurred speech.  No gross or focal neurologic deficits are appreciated. Skin:  Skin is warm, dry and intact. No rash noted. Psychiatric: Somewhat agitated, minimally redirectable.  He is slapping his face at times.  He is stating suicidal ideation.   ____________________________________________  LABS (pertinent positives/negatives) I, Lisa Roca, MD the attending physician have reviewed the labs noted below.  Labs Reviewed  COMPREHENSIVE METABOLIC PANEL - Abnormal; Notable for the following components:      Result Value   Glucose, Bld 109 (*)    All other components within normal limits  ACETAMINOPHEN LEVEL - Abnormal; Notable for the following components:   Acetaminophen (Tylenol), Serum <10 (*)    All other components within normal limits  CBC - Abnormal; Notable for the following components:   RDW 14.7 (*)    All other components within normal limits  URINALYSIS, COMPLETE (UACMP) WITH MICROSCOPIC - Abnormal; Notable for the following components:   Color, Urine COLORLESS (*)    APPearance CLEAR (*)    Specific Gravity, Urine 1.003 (*)    All other components within normal limits  ETHANOL  SALICYLATE LEVEL  URINE DRUG SCREEN,  QUALITATIVE ([facility] ONLY)    ____________________________________________    EKG I, Lisa Roca, MD, the attending physician have personally viewed  and interpreted all ECGs.  92 bpm.  Narrow QRS.  Uncertain axis.  Nonspecific ST and T wave __________________________________________  PROCEDURES  Procedure(s) performed: None  Procedures  Critical Care performed: CRITICAL CARE Performed by: Lisa Roca   Total critical care time: 30 minutes  Critical care time was exclusive of separately billable procedures and treating other patients.  Critical care was necessary to treat or prevent imminent or life-threatening deterioration.  Critical care was time spent personally by me on the following activities: development of treatment plan with patient and/or surrogate as well as nursing, discussions with consultants, evaluation of patient's response to treatment, examination of patient, obtaining history from patient or surrogate, ordering and performing treatments and interventions, ordering and review of laboratory studies, ordering and review of radiographic studies, pulse oximetry and re-evaluation of patient's condition.    ____________________________________________  ED COURSE / ASSESSMENT AND PLAN  Pertinent labs & imaging results that were available during my care of the patient were reviewed by me and considered in my medical decision making (see chart for details).    Patient agitated and loud yelling in the hallway as well as slapping his face.  Initially minimally directable, however patient required intramuscular Geodon and Ativan to help with his acute agitation.  Magda Paganini does have a history of similar.  I did uphold his involuntary commitment in place consultations with TTS as well as tele-psychiatry.  Will transfer care to oncoming physician at shift change 3pm.  Patient able to be moved to [facility].  CONSULTATIONS:  TTS and telepsychiatry.   Patient / Family /  Caregiver informed of clinical course, medical decision-making process, and agree with plan.   ___________________________________________   FINAL CLINICAL IMPRESSION(S) / ED DIAGNOSES   Final diagnoses:  Suicidal ideation      ___________________________________________         Note: This dictation was prepared with Dragon dictation. Any transcriptional errors that result from this process are unintentional    Lisa Roca, MD 06/22/17 1325

## 2017-06-22 NOTE — ED Notes (Addendum)
Patient screaming and hitting self in face stating, "I need help!"

## 2017-06-22 NOTE — ED Notes (Signed)
Patient asking to go to [facility] for treatment.  He is facing possible jail time for an attack on his mother

## 2017-06-22 NOTE — ED Notes (Signed)
Linkoln Alkire - patient's sister -- [phone] -- petitioner for IVC

## 2017-06-22 NOTE — BH Assessment (Signed)
Writer unable to complete TTS Consult at this time. Patient currently agitated.

## 2017-06-22 NOTE — ED Notes (Signed)
Patient calm.  Resting on stretcher.  Eyes closed.  Respirations even and unlabored.  No distress noted at this time.

## 2017-06-22 NOTE — ED Notes (Signed)
PT BROUGHT IN BY PD WITH IVC PAPERS/MD LORD, RN AMY, AND ODS SECURITY MADE AWARE.

## 2017-06-22 NOTE — ED Provider Notes (Signed)
Telepsychiatry has cleared the patient for discharge. Psych has rescinded IVC   Lavonia Drafts, MD 06/22/17 [phone]

## 2017-06-22 NOTE — Progress Notes (Signed)
Pt A & O X3. Denies HI, AVH and pain at this time. Ambulatory from [facility] to [facility] with a steady gait, accompanied by staff. Presents irritable with labile mood. Per nursing report and chart, pt was IVC'd by his sister for depression and suicidal ideation. Per pt "I want to die, I want to kill myself, I know I can't because I have kids but I'm just tired of being depressed." denies active plan when assessed. Agitated about not getting enough medicine to "knock me out, the medication they gave me over there did not work". Preoccupied about 'this incident with my mother, I know she took out paper on me, to take me to court, damn I don't know". Observed banging on walls, verbally abusive intermittently. Redirections offered, pt was recently medication prior to transfer. Will continue to monitor pt for worsening agitation and update ED physician accordingly.

## 2017-06-22 NOTE — ED Triage Notes (Signed)
Arrives with BPD under IVC paperwork for evaluation of depression and suicidal.  Patient denies having a plan and states "I wouldn't do it because I have kids".  States has been feeling this way for over a year.  Has been seen through ED for same in the past.

## 2017-06-26 ENCOUNTER — Other Ambulatory Visit: Payer: Self-pay

## 2017-06-26 NOTE — Patient Outreach (Signed)
[facility] [facility]) Care Management  06/26/2017  Michael Schmitt 1970-05-18 [id number]   Telephone Screen Referral Date : 06/25/2017 Referral Source: [facility] ED census Referral Reason: ED Utilization Insurance: [facility]    Due to the patient's ED admission and involuntary statuse I will be unable to contact the patient.   Plan: RN Health Coach will close the case.  Lazaro Arms RN, BSN, [place] Direct Dial:  [phone] Fax: [phone]

## 2017-06-28 DIAGNOSIS — E1165 Type 2 diabetes mellitus with hyperglycemia: Secondary | ICD-10-CM | POA: Diagnosis not present

## 2017-06-28 DIAGNOSIS — R030 Elevated blood-pressure reading, without diagnosis of hypertension: Secondary | ICD-10-CM | POA: Diagnosis not present

## 2017-06-28 DIAGNOSIS — E782 Mixed hyperlipidemia: Secondary | ICD-10-CM | POA: Diagnosis not present

## 2017-06-30 ENCOUNTER — Other Ambulatory Visit: Payer: Self-pay | Admitting: Psychiatry

## 2017-07-06 ENCOUNTER — Other Ambulatory Visit: Payer: Self-pay | Admitting: Psychiatry

## 2017-07-08 ENCOUNTER — Other Ambulatory Visit: Payer: Self-pay

## 2017-07-08 ENCOUNTER — Emergency Department (HOSPITAL_COMMUNITY)
Admission: EM | Admit: 2017-07-08 | Discharge: 2017-07-09 | Disposition: A | Discharge status: Other Acute Inpatient Hospital | Payer: Medicare Other | Attending: Emergency Medicine | Admitting: Emergency Medicine

## 2017-07-08 ENCOUNTER — Encounter (HOSPITAL_COMMUNITY): Payer: Self-pay

## 2017-07-08 DIAGNOSIS — I129 Hypertensive chronic kidney disease with stage 1 through stage 4 chronic kidney disease, or unspecified chronic kidney disease: Secondary | ICD-10-CM | POA: Insufficient documentation

## 2017-07-08 DIAGNOSIS — Z7982 Long term (current) use of aspirin: Secondary | ICD-10-CM | POA: Insufficient documentation

## 2017-07-08 DIAGNOSIS — I10 Essential (primary) hypertension: Secondary | ICD-10-CM | POA: Diagnosis not present

## 2017-07-08 DIAGNOSIS — I252 Old myocardial infarction: Secondary | ICD-10-CM | POA: Diagnosis not present

## 2017-07-08 DIAGNOSIS — Z7984 Long term (current) use of oral hypoglycemic drugs: Secondary | ICD-10-CM | POA: Diagnosis not present

## 2017-07-08 DIAGNOSIS — R45851 Suicidal ideations: Secondary | ICD-10-CM | POA: Insufficient documentation

## 2017-07-08 DIAGNOSIS — F17210 Nicotine dependence, cigarettes, uncomplicated: Secondary | ICD-10-CM | POA: Diagnosis not present

## 2017-07-08 DIAGNOSIS — E1122 Type 2 diabetes mellitus with diabetic chronic kidney disease: Secondary | ICD-10-CM | POA: Diagnosis not present

## 2017-07-08 DIAGNOSIS — N189 Chronic kidney disease, unspecified: Secondary | ICD-10-CM | POA: Insufficient documentation

## 2017-07-08 DIAGNOSIS — Z7902 Long term (current) use of antithrombotics/antiplatelets: Secondary | ICD-10-CM | POA: Diagnosis not present

## 2017-07-08 DIAGNOSIS — I2510 Atherosclerotic heart disease of native coronary artery without angina pectoris: Secondary | ICD-10-CM | POA: Diagnosis not present

## 2017-07-08 DIAGNOSIS — F332 Major depressive disorder, recurrent severe without psychotic features: Secondary | ICD-10-CM | POA: Diagnosis not present

## 2017-07-08 LAB — CBC
HEMATOCRIT: 42.6 % (ref 39.0–52.0)
HEMOGLOBIN: 13.8 g/dL (ref 13.0–17.0)
MCH: 29.8 pg (ref 26.0–34.0)
MCHC: 32.4 g/dL (ref 30.0–36.0)
MCV: 92 fL (ref 78.0–100.0)
Platelets: 285 10*3/uL (ref 150–400)
RBC: 4.63 MIL/uL (ref 4.22–5.81)
RDW: 15.1 % (ref 11.5–15.5)
WBC: 9.6 10*3/uL (ref 4.0–10.5)

## 2017-07-08 LAB — BASIC METABOLIC PANEL
Anion gap: 10 (ref 5–15)
BUN: 9 mg/dL (ref 6–20)
CHLORIDE: 110 mmol/L (ref 101–111)
CO2: 20 mmol/L — ABNORMAL LOW (ref 22–32)
Calcium: 8.6 mg/dL — ABNORMAL LOW (ref 8.9–10.3)
Creatinine, Ser: 1.04 mg/dL (ref 0.61–1.24)
GFR calc Af Amer: >60 mL/min (ref >60)
GFR calc non Af Amer: >60 mL/min (ref >60)
GLUCOSE: 118 mg/dL — AB (ref 65–99)
POTASSIUM: 3.9 mmol/L (ref 3.5–5.1)
Sodium: 140 mmol/L (ref 135–145)

## 2017-07-08 LAB — SALICYLATE LEVEL
Salicylate Lvl: 31.3 mg/dL (ref 2.8–30.0)
Salicylate Lvl: 25.2 mg/dL (ref 2.8–30.0)

## 2017-07-08 LAB — RAPID URINE DRUG SCREEN, HOSP PERFORMED
Amphetamines: NOT DETECTED
BARBITURATES: NOT DETECTED
BENZODIAZEPINES: NOT DETECTED
COCAINE: NOT DETECTED
Opiates: NOT DETECTED
Tetrahydrocannabinol: NOT DETECTED

## 2017-07-08 LAB — ETHANOL: Alcohol, Ethyl (B): <10 mg/dL (ref <10)

## 2017-07-08 LAB — COMPREHENSIVE METABOLIC PANEL
ALBUMIN: 3.9 g/dL (ref 3.5–5.0)
ALK PHOS: 74 U/L (ref 38–126)
ALT: 19 U/L (ref 17–63)
ANION GAP: 9 (ref 5–15)
AST: 17 U/L (ref 15–41)
BUN: 8 mg/dL (ref 6–20)
CALCIUM: 8.9 mg/dL (ref 8.9–10.3)
CO2: 20 mmol/L — ABNORMAL LOW (ref 22–32)
Chloride: 111 mmol/L (ref 101–111)
Creatinine, Ser: 1.03 mg/dL (ref 0.61–1.24)
GFR calc Af Amer: >60 mL/min (ref >60)
GFR calc non Af Amer: >60 mL/min (ref >60)
GLUCOSE: 80 mg/dL (ref 65–99)
Potassium: 4 mmol/L (ref 3.5–5.1)
Sodium: 140 mmol/L (ref 135–145)
TOTAL PROTEIN: 6.7 g/dL (ref 6.5–8.1)
Total Bilirubin: 0.3 mg/dL (ref 0.3–1.2)

## 2017-07-08 LAB — URINALYSIS, ROUTINE W REFLEX MICROSCOPIC
Bilirubin Urine: NEGATIVE
Glucose, UA: NEGATIVE mg/dL
Hgb urine dipstick: NEGATIVE
KETONES UR: NEGATIVE mg/dL
LEUKOCYTES UA: NEGATIVE
NITRITE: NEGATIVE
PH: 6 (ref 5.0–8.0)
Protein, ur: NEGATIVE mg/dL
SPECIFIC GRAVITY, URINE: 1.008 (ref 1.005–1.030)

## 2017-07-08 LAB — ACETAMINOPHEN LEVEL
Acetaminophen (Tylenol), Serum: <10 ug/mL — ABNORMAL LOW (ref 10–30)
Acetaminophen (Tylenol), Serum: <10 ug/mL — ABNORMAL LOW (ref 10–30)

## 2017-07-08 LAB — I-STAT VENOUS BLOOD GAS, ED
Acid-base deficit: 2 mmol/L (ref 0.0–2.0)
BICARBONATE: 21.7 mmol/L (ref 20.0–28.0)
O2 SAT: 92 %
PCO2 VEN: 34.2 mmHg — AB (ref 44.0–60.0)
PO2 VEN: 64 mmHg — AB (ref 32.0–45.0)
TCO2: 23 mmol/L (ref 22–32)
pH, Ven: 7.41 (ref 7.250–7.430)

## 2017-07-08 LAB — LITHIUM LEVEL: Lithium Lvl: 0.67 mmol/L (ref 0.60–1.20)

## 2017-07-08 LAB — MAGNESIUM: Magnesium: 1.8 mg/dL (ref 1.7–2.4)

## 2017-07-08 MED ORDER — LITHIUM CARBONATE ER 450 MG PO TBCR
450.0000 mg | EXTENDED_RELEASE_TABLET | Freq: Two times a day (BID) | ORAL | Status: DC
  Administered 2017-07-08: 450 mg via ORAL
  Filled 2017-07-08 (×2): qty 1

## 2017-07-08 MED ORDER — CARVEDILOL 12.5 MG PO TABS
6.2500 mg | ORAL_TABLET | Freq: Two times a day (BID) | ORAL | Status: DC
  Administered 2017-07-09: 6.25 mg via ORAL
  Filled 2017-07-08: qty 1

## 2017-07-08 MED ORDER — CLONAZEPAM 0.5 MG PO TABS
0.5000 mg | ORAL_TABLET | Freq: Three times a day (TID) | ORAL | Status: DC
  Administered 2017-07-08: 0.5 mg via ORAL
  Filled 2017-07-08: qty 1

## 2017-07-08 MED ORDER — ZOLPIDEM TARTRATE 5 MG PO TABS
10.0000 mg | ORAL_TABLET | Freq: Every day | ORAL | Status: DC
  Administered 2017-07-08: 10 mg via ORAL
  Filled 2017-07-08: qty 2

## 2017-07-08 MED ORDER — PRAZOSIN HCL 2 MG PO CAPS
2.0000 mg | ORAL_CAPSULE | Freq: Every day | ORAL | Status: DC
  Administered 2017-07-08: 2 mg via ORAL
  Filled 2017-07-08: qty 1

## 2017-07-08 MED ORDER — VENLAFAXINE HCL ER 75 MG PO CP24: 75.0000 mg | ORAL_CAPSULE | Freq: Every day | ORAL | Status: DC

## 2017-07-08 MED ORDER — TRAZODONE HCL 100 MG PO TABS: 100.0000 mg | ORAL_TABLET | Freq: Every evening | ORAL | Status: DC | PRN

## 2017-07-08 MED ORDER — MIRTAZAPINE 45 MG PO TABS
45.0000 mg | ORAL_TABLET | Freq: Every day | ORAL | Status: DC
  Administered 2017-07-08: 45 mg via ORAL
  Filled 2017-07-08: qty 3
  Filled 2017-07-08: qty 1

## 2017-07-08 MED ORDER — QUETIAPINE FUMARATE 300 MG PO TABS
300.0000 mg | ORAL_TABLET | Freq: Every day | ORAL | Status: DC
  Administered 2017-07-08: 300 mg via ORAL
  Filled 2017-07-08: qty 1

## 2017-07-08 MED ORDER — OLANZAPINE 10 MG PO TABS
20.0000 mg | ORAL_TABLET | Freq: Every day | ORAL | Status: DC
  Administered 2017-07-08: 20 mg via ORAL
  Filled 2017-07-08: qty 2

## 2017-07-08 MED ORDER — HYDROXYZINE HCL 50 MG PO TABS: 50.0000 mg | ORAL_TABLET | Freq: Three times a day (TID) | ORAL | Status: DC | PRN

## 2017-07-08 MED ORDER — LORAZEPAM 2 MG/ML IJ SOLN
2.0000 mg | Freq: Once | INTRAMUSCULAR | Status: AC
  Administered 2017-07-08: 2 mg via INTRAMUSCULAR
  Filled 2017-07-08: qty 1

## 2017-07-08 MED ORDER — CLOPIDOGREL BISULFATE 75 MG PO TABS
75.0000 mg | ORAL_TABLET | Freq: Every day | ORAL | Status: DC
  Filled 2017-07-08: qty 1

## 2017-07-08 MED ORDER — ALUM & MAG HYDROXIDE-SIMETH 200-200-20 MG/5ML PO SUSP: 30.0000 mL | Freq: Four times a day (QID) | ORAL | Status: DC | PRN

## 2017-07-08 MED ORDER — LURASIDONE HCL 40 MG PO TABS: 80.0000 mg | ORAL_TABLET | Freq: Every day | ORAL | Status: DC

## 2017-07-08 MED ORDER — SODIUM CHLORIDE 0.9 % IV BOLUS
1000.0000 mL | Freq: Once | INTRAVENOUS | Status: AC
  Administered 2017-07-08: 1000 mL via INTRAVENOUS

## 2017-07-08 MED ORDER — PAROXETINE HCL 20 MG PO TABS: 20.0000 mg | ORAL_TABLET | Freq: Every day | ORAL | Status: DC

## 2017-07-08 MED ORDER — ATORVASTATIN CALCIUM 80 MG PO TABS: 80.0000 mg | ORAL_TABLET | Freq: Every day | ORAL | Status: DC

## 2017-07-08 MED ORDER — ESCITALOPRAM OXALATE 10 MG PO TABS
20.0000 mg | ORAL_TABLET | Freq: Every day | ORAL | Status: DC
  Administered 2017-07-08: 20 mg via ORAL
  Filled 2017-07-08: qty 2

## 2017-07-08 MED ORDER — ASPIRIN EC 81 MG PO TBEC
81.0000 mg | DELAYED_RELEASE_TABLET | Freq: Every day | ORAL | Status: DC
  Filled 2017-07-08 (×2): qty 1

## 2017-07-08 MED ORDER — METFORMIN HCL 500 MG PO TABS
500.0000 mg | ORAL_TABLET | Freq: Two times a day (BID) | ORAL | Status: DC
  Administered 2017-07-09: 500 mg via ORAL
  Filled 2017-07-08: qty 1

## 2017-07-08 MED ORDER — ACETAMINOPHEN 325 MG PO TABS: 650.0000 mg | ORAL_TABLET | ORAL | Status: DC | PRN

## 2017-07-08 MED ORDER — NICOTINE 21 MG/24HR TD PT24
21.0000 mg | MEDICATED_PATCH | Freq: Every day | TRANSDERMAL | Status: DC
  Administered 2017-07-08: 21 mg via TRANSDERMAL
  Filled 2017-07-08: qty 1

## 2017-07-08 MED ORDER — ONDANSETRON HCL 4 MG PO TABS: 4.0000 mg | ORAL_TABLET | Freq: Three times a day (TID) | ORAL | Status: DC | PRN

## 2017-07-08 NOTE — ED Notes (Signed)
Pt up and down to restroom. Pt hitting, slapping himself and kicking in the bed. Pt gets increasingly agitated when ask to stop. Pt has trazadone available for sleep/agitation but is so sedated at times now, do not feel comfortable giving any more sedatives. VSS. Will speak with md about pt and situation.

## 2017-07-08 NOTE — ED Notes (Signed)
Pt states that the vistaril will make him stay awake and he doesn't like it.

## 2017-07-08 NOTE — ED Notes (Addendum)
Per Poison Control:  Please continue to monitor BUN/createnine, acid/bass status, repeat Aspirin level at 1600.  Recommend adding magnesium.  Recommend blood gas (vein or artery).  Offer patient charcoal depending on possible consumption??  NEed to review hx of ingestion.  Urine PH recommended.  Cardiac monitor with EKG.  Repeat tylenol with aspirin repeat.  Recommend not giving any benzos or medicines that will decrease respiratory status (potential for acidosis).  Fluid bolus for urine output.  Supportive care for any other symptoms.    Look out for: tachycardia, diaphoresis, and tinnitus.

## 2017-07-08 NOTE — ED Notes (Signed)
Gave pt [place] sandwich, applesauce & Sprite, per Dr. Gilford Raid.

## 2017-07-08 NOTE — ED Notes (Signed)
Pt placed into paper scrubs, belongings in locker 5, pt wanded by security

## 2017-07-08 NOTE — ED Notes (Signed)
Pt continues to yell out and curse. Up and down out of bed.

## 2017-07-08 NOTE — BH Assessment (Addendum)
Tele Assessment Note   Patient Name: Michael Schmitt MRN: [id number] Referring Physician: Kennith Maes, PA Location of Patient: [facility] Location of Provider: [place] is an 47 y.o. male, who presents voluntary and unaccompanied to [facility]. Clinician asked the pt, "what brought you to the hospital?" Pt reported, "suicidal over two years and it's getting worse to the point I was about to do something last week." Pt reported, last week he tried to cut his wrist with a knife but he wants to be a good father. Pt often hits himself in the head. Pt planned to shooting or cut himself. Pt reported, two years ago he tried shooting himself but the gun had no bullets. Pt denies, HI, AVH, and access to weapons.   Pt denies, abuse. Pt reported, smoking two pack of cigarettes, daily. Pt reported, being linked to a psychiatrist in [place], [place]. Pt reported, he is prescribed, Lithium, Zyprexa, Klonopin, and Ambien. Pt reported, taking medication as prescribed. Pt reported, previous inpatient admissions.   Pt presents alert, in scrubs with logical/coherent speech. Pt's eye contact was fair. Pt's mood was depressed/anxious. Pt's affect was congruent with mood. Pt's thought process was coherent/relevant. Pt's judgement was partial. Pt was oriented x4. Pt concentration was normal. Pt's insight and impulse control are fair. Pt reported, if discharged [facility] he could not contract for safety. Pt reported, if inpatient treatment was recommended he would sign-in voluntarily.   Diagnosis: F33.2 Major Depressive Disorder, recurrent, severe without psychotic features.   Past Medical History:  Past Medical History:  Diagnosis Date  . Anxiety   . Anxiety   . Arthritis    cerv. stenosis, spondylosis, HNP- lower back , has been followed in pain clinic, has  had injection s in cerv. area  . Blood dyscrasia    told that when he was young he was a" free bleeder"  . CAD  (coronary artery disease)   . Cervical spondylosis without myelopathy 07/24/2014  . Cervicogenic headache 07/24/2014  . Chronic kidney disease    renal calculi- passed spontaneously  . Depression   . Diabetes mellitus without complication ([place])   . Fatty liver   . GERD (gastroesophageal reflux disease)   . Headache(784.0)   . Hyperlipidemia   . Hypertension   . Mental disorder   . MI, old   . RLS (restless legs syndrome)    detected on sleep study  . Shortness of breath   . Ventricular fibrillation ([place]) 04/05/2016    Past Surgical History:  Procedure Laterality Date  . ANTERIOR CERVICAL DECOMP/DISCECTOMY FUSION  11/13/2011   Procedure: ANTERIOR CERVICAL DECOMPRESSION/DISCECTOMY FUSION 1 LEVEL;  Surgeon: [name], MD;  Location: [place] NEURO ORS;  Service: Neurosurgery;  Laterality: N/A;  Anterior Cervical Decompression/discectomy Fusion. Cervical three-four.  Marland Kitchen CARDIAC CATHETERIZATION N/A 01/01/2015   Procedure: Left Heart Cath and Coronary Angiography;  Surgeon: [name], MD;  Location: [place] CV LAB;  Service: Cardiovascular;  Laterality: N/A;  . CARDIAC CATHETERIZATION N/A 04/05/2016   Procedure: Left Heart Cath and Coronary Angiography;  Surgeon: Belva Crome, MD;  Location: [place] CV LAB;  Service: Cardiovascular;  Laterality: N/A;  . CARDIAC CATHETERIZATION N/A 04/05/2016   Procedure: Coronary Stent Intervention;  Surgeon: Belva Crome, MD;  Location: [place] CV LAB;  Service: Cardiovascular;  Laterality: N/A;  . CARDIAC CATHETERIZATION N/A 04/05/2016   Procedure: Intravascular Ultrasound/IVUS;  Surgeon: Belva Crome, MD;  Location: [place] CV LAB;  Service:  Cardiovascular;  Laterality: N/A;  . NASAL SINUS SURGERY     2005    Family History:  Family History  Problem Relation Age of Onset  . Prostate cancer Father   . Hypertension Mother   . Kidney Stones Mother   . Anxiety disorder Mother   . Depression Mother   . COPD Sister   . Hypertension  Sister   . Diabetes Sister   . Depression Sister   . Anxiety disorder Sister   . Seizures Sister   . ADD / ADHD Son   . ADD / ADHD Daughter     Social History:  reports that he has been smoking cigarettes and cigars.  He has a 34.00 pack-year smoking history. He has quit using smokeless tobacco. He reports that he does not drink alcohol or use drugs.  Additional Social History:  Alcohol / Drug Use Pain Medications: See MAR Prescriptions: See MAR Over the Counter: See MAR History of alcohol / drug use?: Yes Substance #1 Name of Substance 1: Cigarettes. 1 - Age of First Use: UTA 1 - Amount (size/oz): Pt reported, smoking two packs of cigarettes, daily.  1 - Frequency: Daily. 1 - Duration: Ongoing.  1 - Last Use / Amount: Pt reported, daily.   CIWA: CIWA-Ar BP: 122/66 Pulse Rate: 87 COWS:    Allergies:  Allergies  Allergen Reactions  . Prednisone Other (See Comments)    Hypertension, makes him feel spacey  . Hydrocodone Other (See Comments)    Headache, irritable  . Varenicline Other (See Comments)    Suicidal thoughts    Home Medications:  (Not in a hospital admission)  OB/GYN Status:  No LMP for male patient.  General Assessment Data Location of Assessment: [facility] TTS Assessment: In system Is this a Tele or Face-to-Face Assessment?: Tele Assessment Is this an Initial Assessment or a Re-assessment for this encounter?: Initial Assessment Marital status: Widowed Living Arrangements: Parent, Children, Other relatives Can pt return to current living arrangement?: Yes Admission Status: Voluntary Is patient capable of signing voluntary admission?: Yes Referral Source: Self/Family/Friend Insurance type: [employer].      Crisis Care Plan Living Arrangements: Parent, Children, Other relatives Legal Guardian: Other:(Self. ) Name of Psychiatrist: Pt reported, psychiatrist in [place], [place].  Name of Therapist: Pending.   Education Status Is patient  currently in school?: No Is the patient employed, unemployed or receiving disability?: Receiving disability income  Risk to self with the past 6 months Suicidal Ideation: Yes-Currently Present Has patient been a risk to self within the past 6 months prior to admission? : Yes Suicidal Intent: Yes-Currently Present Has patient had any suicidal intent within the past 6 months prior to admission? : Yes Is patient at risk for suicide?: Yes Suicidal Plan?: Yes-Currently Present Has patient had any suicidal plan within the past 6 months prior to admission? : Yes Specify Current Suicidal Plan: Pt planned to shoot himself or cut himself.  Access to Means: No(Pt denies access to weapons. ) What has been your use of drugs/alcohol within the last 12 months?: Cigarettes. Previous Attempts/Gestures: Yes How many times?: 1 Other Self Harm Risks: Pt reported, cutting his wrist. Per chart, pt reported, hitting himself in the head.  Triggers for Past Attempts: None known Intentional Self Injurious Behavior: Cutting, Damaging Comment - Self Injurious Behavior: Pt reported, cutting his wrist. Pt often hits himself in the head.  Family Suicide History: No Recent stressful life event(s): Loss (Comment), Other (Comment)(Pt has been a widow for  13 years, depression, Bipolar. ) Persecutory voices/beliefs?: No Depression: Yes Depression Symptoms: Feeling angry/irritable, Feeling worthless/self pity, Loss of interest in usual pleasures, Guilt, Fatigue, Isolating Substance abuse history and/or treatment for substance abuse?: No Suicide prevention information given to non-admitted patients: Not applicable  Risk to Others within the past 6 months Homicidal Ideation: No(Pt denies. ) Does patient have any lifetime risk of violence toward others beyond the six months prior to admission? : No(Pt denies. ) Thoughts of Harm to Others: No(Pt denies. ) Current Homicidal Intent: No Current Homicidal Plan: No Access to  Homicidal Means: No Identified Victim: NA History of harm to others?: No Assessment of Violence: None Noted Violent Behavior Description: NA Does patient have access to weapons?: No(Pt denies. ) Criminal Charges Pending?: No Does patient have a court date: No Is patient on probation?: No  Psychosis Hallucinations: None noted Delusions: None noted  Mental Status Report Appearance/Hygiene: In scrubs Eye Contact: Fair Motor Activity: Unremarkable Speech: Logical/coherent Level of Consciousness: Alert Mood: Depressed, Anxious Affect: Other (Comment) Anxiety Level: Minimal Thought Processes: Coherent, Relevant Judgement: Partial Orientation: Person, Place, Time, Situation Obsessive Compulsive Thoughts/Behaviors: None  Cognitive Functioning Concentration: Normal Memory: Recent Intact Is patient IDD: No Is patient DD?: No Insight: Fair Impulse Control: Fair Appetite: Good Sleep: Decreased Total Hours of Sleep: (Pt reported, 4-5 hours.) Vegetative Symptoms: (Pt reported not having energy. )  ADLScreening [facility] Assessment Services) Patient's cognitive ability adequate to safely complete daily activities?: Yes Patient able to express need for assistance with ADLs?: Yes Independently performs ADLs?: Yes (appropriate for developmental age)  Prior Inpatient Therapy Prior Inpatient Therapy: No(Mother, sister. )     ADL Screening (condition at time of admission) Patient's cognitive ability adequate to safely complete daily activities?: Yes Is the patient deaf or have difficulty hearing?: No Does the patient have difficulty seeing, even when wearing glasses/contacts?: Yes(Pt reported, he is supposed to wear glasses. ) Does the patient have difficulty concentrating, remembering, or making decisions?: Yes Patient able to express need for assistance with ADLs?: Yes Does the patient have difficulty dressing or bathing?: No Independently performs ADLs?: Yes (appropriate for  developmental age) Does the patient have difficulty walking or climbing stairs?: No Weakness of Legs: None  Home Assistive Devices/Equipment Home Assistive Devices/Equipment: None    Abuse/Neglect Assessment (Assessment to be complete while patient is alone) Abuse/Neglect Assessment Can Be Completed: Yes Physical Abuse: Denies(Pt denies. ) Verbal Abuse: Denies(Pt denies. ) Sexual Abuse: Denies(Pt denies. ) Exploitation of patient/patient's resources: Denies(Pt denies. ) Self-Neglect: Denies(Pt denies. )     Advance Directives (For Healthcare) Does Patient Have a Medical Advance Directive?: (UTA)    Additional Information 1:1 In Past 12 Months?: No CIRT Risk: No Elopement Risk: No Does patient have medical clearance?: Yes     Disposition: Lindon Romp, NP recommends inpatient treatment. Disposition discussed with Aldona Bar, [place] and [place], Therapist, sports.   Disposition Initial Assessment Completed for this Encounter: Yes Disposition of Patient: (inpatient treatment.) Patient refused recommended treatment: No Mode of transportation if patient is discharged?: N/A  This service was provided via telemedicine using a 2-way, interactive audio and video technology.  Names of all persons participating in this telemedicine service and their role in this encounter.               Vertell Novak 07/08/2017 8:38 PM   Vertell Novak, MS, [facility], [facility] Triage Specialist [phone]

## 2017-07-08 NOTE — ED Notes (Signed)
In to speak with pt and pt states "I wake up everyday thinking about suicide" "Its terrible" "I don't want to, because of my kids, but I think about it too much". Pt states "I need a long term facility that is going to help me". "Do you know what they are going to do?" Explained the process to pt and that they are looking for placement at this time. Told pt that we are here to help him get better so he doesn't feel that way. Pt cooperative and calm at this time. States "I hit myself sometimes, I don't know why, but I just do". Will continue to monitor. Sitter at bedside.

## 2017-07-08 NOTE — ED Notes (Signed)
TTS at bedside. 

## 2017-07-08 NOTE — ED Notes (Signed)
Poison control updated on pt, requesting VBG, collected earlier, not resulted. Will recollect.

## 2017-07-08 NOTE — ED Notes (Signed)
Pt up and down out of the bed, will not relax and lay still. Fighting medication. Pt slurring words and walking back and forth to the restroom. When ask pt to stay in room, he gets more agitated and starts cursing and yelling.

## 2017-07-08 NOTE — ED Notes (Signed)
Lab called nurse first stated pt had an elevated Salicylate Lvl of 14.9 Nurse was notified.

## 2017-07-08 NOTE — ED Notes (Signed)
Spoke with Dr. Gilford Raid about pt status and how pt is slurring words, hitting himself, stumbling, and yelling out. This RN does not want to give trazadone or vistaril due to all the medications pt has had. MD agrees to hold on the trazadone and vistaril. Will continue to monitor pt.

## 2017-07-08 NOTE — ED Notes (Signed)
Snack and drink provided. ?

## 2017-07-08 NOTE — ED Notes (Signed)
Pt observed hitting himself and yelling out. Pt is tired, but fighting it. Pt trying to get up and walk to restroom. After medications, pt is a fall risk. Urinal given to pt. Urinated in urinal, but proceeded to get up and ambulate to restroom. Pt pushing door closed. Will continue to monitor pt and try to get him back in bed.

## 2017-07-08 NOTE — [facility] Counselor (Signed)
Pt has been referred to the following inpatient treatment facilities:   [facility]. [facility]. [facility]. [facility].   TTS will continue to follow up.   Vertell Novak, MS, [facility], [facility] Triage Specialist [phone]

## 2017-07-08 NOTE — ED Notes (Signed)
Salicylate reported to [place], [place] in pod A.

## 2017-07-08 NOTE — ED Provider Notes (Signed)
[facility] EMERGENCY DEPARTMENT Provider Note   CSN: [id number] Arrival date & time: 07/08/17  1259     History   Chief Complaint Chief Complaint  Patient presents with  . Suicidal    HPI Michael Schmitt is a 47 y.o. male with a hx of anxiety, depression, CKD, CAD, and vfib who presents to the ED requiring medical clearance for SI. Patient states he has felt suicidal for the past 1-2 years, states it has been worse lately, unable to give an exact time frame. States plans include shooting himself with a gun or utilizing a knife. States he attempted suicide 2 years ago with pulling trigger of gun that did not have any bullets. No alleviating/aggravating factors. States he is tired of feeling this way and often hits himself in the head with frustration. No LOC. No other complaints at this time. Denies fever, chills, vomiting, chest pain, dyspnea, palpitations, or tinnitus. Patient states he is tired because he has not been sleeping.   He states he has taken his 81 mg of aspirin which he takes daily. He has had a goodie powder as well.   HPI  Past Medical History:  Diagnosis Date  . Anxiety   . Anxiety   . Arthritis    cerv. stenosis, spondylosis, HNP- lower back , has been followed in pain clinic, has  had injection s in cerv. area  . Blood dyscrasia    told that when he was young he was a" free bleeder"  . CAD (coronary artery disease)   . Cervical spondylosis without myelopathy 07/24/2014  . Cervicogenic headache 07/24/2014  . Chronic kidney disease    renal calculi- passed spontaneously  . Depression   . Diabetes mellitus without complication ([place])   . Fatty liver   . GERD (gastroesophageal reflux disease)   . Headache(784.0)   . Hyperlipidemia   . Hypertension   . Mental disorder   . MI, old   . RLS (restless legs syndrome)    detected on sleep study  . Shortness of breath   . Ventricular fibrillation ([place]) 04/05/2016    Patient Active Problem  List   Diagnosis Date Noted  . Severe recurrent major depression without psychotic features ([place]) 03/12/2017  . Generalized anxiety disorder 02/05/2017  . Chronic neck pain (Primary Area of Pain) (Left) 11/22/2016  . Cervical facet syndrome (Left) 11/22/2016  . Cervical radiculitis (Left) 11/22/2016  . History of cervical spinal surgery 11/22/2016  . Cervical spondylosis 11/22/2016  . Chronic shoulder pain (Secondary Area of Pain) (Left) 11/22/2016  . Arthropathy of shoulder (Left) 11/22/2016  . Chronic low back pain [facility] Area of Pain) (Bilateral) (L>R) 11/22/2016  . Lumbar facet syndrome (Bilateral) (L>R) 11/22/2016  . Chronic pain syndrome 11/21/2016  . Long term prescription benzodiazepine use 11/21/2016  . Opiate use 11/21/2016  . Uncontrolled type 2 diabetes mellitus without complication, without long-term current use of insulin ([place]) 10/19/2016  . Chronic Suicidal ideation 09/15/2016  . Coronary artery disease 09/12/2016  . HTN (hypertension) 06/26/2016  . GERD (gastroesophageal reflux disease) 06/26/2016  . Alcohol use disorder, severe, 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[place]) 06/26/2016  . Moderate benzodiazepine use disorder ([place]) 06/11/2016  . Sedative, hypnotic or anxiolytic use disorder, severe, dependence ([place]) 05/12/2016  . Ventricular fibrillation ([place]) 04/05/2016  . Combined hyperlipidemia 01/05/2016  . Cervical spondylosis without myelopathy 07/24/2014  . Severe episode of recurrent major depressive disorder, without psychotic features ([place]) 04/15/2013  . Tobacco use disorder 10/20/2010  . Asthma 10/20/2010  Past Surgical History:  Procedure Laterality Date  . ANTERIOR CERVICAL DECOMP/DISCECTOMY FUSION  11/13/2011   Procedure: ANTERIOR CERVICAL DECOMPRESSION/DISCECTOMY FUSION 1 LEVEL;  Surgeon: [name], MD;  Location: [facility];  Service: Neurosurgery;  Laterality: N/A;  Anterior Cervical Decompression/discectomy Fusion. Cervical three-four.  Marland Kitchen CARDIAC  CATHETERIZATION N/A 01/01/2015   Procedure: Left Heart Cath and Coronary Angiography;  Surgeon: [name], MD;  Location: [facility];  Service: Cardiovascular;  Laterality: N/A;  . CARDIAC CATHETERIZATION N/A 04/05/2016   Procedure: Left Heart Cath and Coronary Angiography;  Surgeon: Belva Crome, MD;  Location: [facility];  Service: Cardiovascular;  Laterality: N/A;  . CARDIAC CATHETERIZATION N/A 04/05/2016   Procedure: Coronary Stent Intervention;  Surgeon: Belva Crome, MD;  Location: [facility];  Service: Cardiovascular;  Laterality: N/A;  . CARDIAC CATHETERIZATION N/A 04/05/2016   Procedure: Intravascular Ultrasound/IVUS;  Surgeon: Belva Crome, MD;  Location: [facility];  Service: Cardiovascular;  Laterality: N/A;  . NASAL SINUS SURGERY     2005        Home Medications    Prior to Admission medications   Medication Sig Start Date End Date Taking? Authorizing Provider  aspirin EC 81 MG EC tablet Take 1 tablet (81 mg total) by mouth daily. 04/03/17   Clapacs, Madie Reno, MD  atorvastatin (LIPITOR) 80 MG tablet Take 1 tablet (80 mg total) by mouth daily at 6 PM. 04/02/17   Clapacs, Madie Reno, MD  atorvastatin (LIPITOR) 80 MG tablet Take 1 tablet (80 mg total) by mouth daily at 6 PM. 04/16/17   Clapacs, Madie Reno, MD  carvedilol (COREG) 6.25 MG tablet Take 1 tablet (6.25 mg total) by mouth 2 (two) times daily with a meal. 04/16/17   Clapacs, Madie Reno, MD  clopidogrel (PLAVIX) 75 MG tablet Take 1 tablet (75 mg total) by mouth daily. 04/17/17   Clapacs, Madie Reno, MD  escitalopram (LEXAPRO) 20 MG tablet Take 1 tablet (20 mg total) by mouth daily. 04/17/17   Clapacs, Madie Reno, MD  hydrOXYzine (ATARAX/VISTARIL) 50 MG tablet Take 1 tablet (50 mg total) by mouth 3 (three) times daily as needed for anxiety. 04/16/17   Clapacs, Madie Reno, MD  Lurasidone HCl 60 MG TABS Take 1 tablet (60 mg total) by mouth daily with breakfast. 04/16/17   Clapacs, Madie Reno, MD  metFORMIN (GLUCOPHAGE) 500 MG  tablet Take 1 tablet (500 mg total) by mouth 2 (two) times daily with a meal. 04/16/17   Clapacs, Madie Reno, MD  mirtazapine (REMERON) 45 MG tablet Take 1 tablet (45 mg total) by mouth at bedtime. 04/16/17   Clapacs, Madie Reno, MD  nitroGLYCERIN (NITROSTAT) 0.4 MG SL tablet Place 1 tablet (0.4 mg total) under the tongue every 5 (five) minutes x 3 doses as needed. 09/14/16 09/14/17  Hildred Priest, MD  prazosin (MINIPRESS) 2 MG capsule Take 1 capsule (2 mg total) by mouth at bedtime. 04/16/17   Clapacs, Madie Reno, MD  QUEtiapine (SEROQUEL) 300 MG tablet Take 1 tablet (300 mg total) by mouth at bedtime. 04/16/17   Clapacs, Madie Reno, MD  traZODone (DESYREL) 100 MG tablet Take 1 tablet (100 mg total) by mouth at bedtime as needed for sleep. 04/16/17   Clapacs, Madie Reno, MD  zolpidem (AMBIEN) 10 MG tablet Take 1 tablet (10 mg total) by mouth at bedtime. 04/16/17   Clapacs, Madie Reno, MD    Family History Family History  Problem Relation Age of Onset  .  Prostate cancer Father   . Hypertension Mother   . Kidney Stones Mother   . Anxiety disorder Mother   . Depression Mother   . COPD Sister   . Hypertension Sister   . Diabetes Sister   . Depression Sister   . Anxiety disorder Sister   . Seizures Sister   . ADD / ADHD Son   . ADD / ADHD Daughter     Social History Social History   Tobacco Use  . Smoking status: Current Every Day Smoker    Packs/day: 2.00    Years: 17.00    Pack years: 34.00    Types: Cigarettes, Cigars  . Smokeless tobacco: Former Systems developer  . Tobacco comment: occassional snuff   Substance Use Topics  . Alcohol use: No    Alcohol/week: 0.0 oz    Comment: last drinked in 3 months.   . Drug use: No     Allergies   Prednisone; Hydrocodone; and Varenicline   Review of Systems Review of Systems  Constitutional: Negative for chills and fever.  HENT: Negative for ear pain, hearing loss and tinnitus.   Respiratory: Negative for shortness of breath.   Cardiovascular: Negative for  chest pain and palpitations.  Gastrointestinal: Negative for abdominal pain and vomiting.  Psychiatric/Behavioral: Positive for suicidal ideas.  All other systems reviewed and are negative.   Physical Exam Updated Vital Signs BP 129/86   Pulse 97   Temp 98.7 F (37.1 C) (Oral)   Resp 18   Ht 6\' 2"  (1.88 m)   Wt 92.5 kg (204 lb)   SpO2 97%   BMI 26.19 kg/m   Physical Exam  Constitutional: He is oriented to person, place, and time. He appears well-developed and well-nourished. No distress.  HENT:  Head: Normocephalic and atraumatic.  Eyes: Conjunctivae are normal. Right eye exhibits no discharge. Left eye exhibits no discharge.  Cardiovascular: Normal rate and regular rhythm.  No murmur heard. Pulmonary/Chest: Breath sounds normal. No respiratory distress. He has no wheezes. He has no rales.  Abdominal: Soft. He exhibits no distension. There is no tenderness.  Neurological: He is alert and oriented to person, place, and time.  Clear speech.   Skin: Skin is warm and dry. No rash noted.  Psychiatric: He has a normal mood and affect. He is agitated (somewhat). He expresses suicidal ideation. He expresses no homicidal ideation. He expresses suicidal plans. He expresses no homicidal plans.  Patient with somewhat slow responses to questions.   Nursing note and vitals reviewed.   ED Treatments / Results  Labs Results for orders placed or performed during the hospital encounter of 07/08/17  Comprehensive metabolic panel  Result Value Ref Range   Sodium 140 135 - 145 mmol/L   Potassium 4.0 3.5 - 5.1 mmol/L   Chloride 111 101 - 111 mmol/L   CO2 20 (L) 22 - 32 mmol/L   Glucose, Bld 80 65 - 99 mg/dL   BUN 8 6 - 20 mg/dL   Creatinine, Ser 1.03 0.61 - 1.24 mg/dL   Calcium 8.9 8.9 - 10.3 mg/dL   Total Protein 6.7 6.5 - 8.1 g/dL   Albumin 3.9 3.5 - 5.0 g/dL   AST 17 15 - 41 U/L   ALT 19 17 - 63 U/L   Alkaline Phosphatase 74 38 - 126 U/L   Total Bilirubin 0.3 0.3 - 1.2 mg/dL   GFR  calc non Af Amer >60 >60 mL/min   GFR calc Af Amer >60 >60 mL/min  Anion gap 9 5 - 15  Ethanol  Result Value Ref Range   Alcohol, Ethyl (B) <07 <37 mg/dL  Salicylate level  Result Value Ref Range   Salicylate Lvl 10.6 (HH) 2.8 - 30.0 mg/dL  Acetaminophen level  Result Value Ref Range   Acetaminophen (Tylenol), Serum <10 (L) 10 - 30 ug/mL  cbc  Result Value Ref Range   WBC 9.6 4.0 - 10.5 K/uL   RBC 4.63 4.22 - 5.81 MIL/uL   Hemoglobin 13.8 13.0 - 17.0 g/dL   HCT 42.6 39.0 - 52.0 %   MCV 92.0 78.0 - 100.0 fL   MCH 29.8 26.0 - 34.0 pg   MCHC 32.4 30.0 - 36.0 g/dL   RDW 15.1 11.5 - 15.5 %   Platelets 285 150 - 400 K/uL  Magnesium  Result Value Ref Range   Magnesium 1.8 1.7 - 2.4 mg/dL  Basic metabolic panel  Result Value Ref Range   Sodium 140 135 - 145 mmol/L   Potassium 3.9 3.5 - 5.1 mmol/L   Chloride 110 101 - 111 mmol/L   CO2 20 (L) 22 - 32 mmol/L   Glucose, Bld 118 (H) 65 - 99 mg/dL   BUN 9 6 - 20 mg/dL   Creatinine, Ser 1.04 0.61 - 1.24 mg/dL   Calcium 8.6 (L) 8.9 - 10.3 mg/dL   GFR calc non Af Amer >60 >60 mL/min   GFR calc Af Amer >60 >60 mL/min   Anion gap 10 5 - 15  Salicylate level  Result Value Ref Range   Salicylate Lvl 26.9 2.8 - 30.0 mg/dL  Acetaminophen level  Result Value Ref Range   Acetaminophen (Tylenol), Serum <10 (L) 10 - 30 ug/mL  Urinalysis, Routine w reflex microscopic  Result Value Ref Range   Color, Urine STRAW (A) YELLOW   APPearance CLEAR CLEAR   Specific Gravity, Urine 1.008 1.005 - 1.030   pH 6.0 5.0 - 8.0   Glucose, UA NEGATIVE NEGATIVE mg/dL   Hgb urine dipstick NEGATIVE NEGATIVE   Bilirubin Urine NEGATIVE NEGATIVE   Ketones, ur NEGATIVE NEGATIVE mg/dL   Protein, ur NEGATIVE NEGATIVE mg/dL   Nitrite NEGATIVE NEGATIVE   Leukocytes, UA NEGATIVE NEGATIVE  Rapid urine drug screen (hospital performed)  Result Value Ref Range   Opiates NONE DETECTED NONE DETECTED   Cocaine NONE DETECTED NONE DETECTED   Benzodiazepines NONE DETECTED  NONE DETECTED   Amphetamines NONE DETECTED NONE DETECTED   Tetrahydrocannabinol NONE DETECTED NONE DETECTED   Barbiturates NONE DETECTED NONE DETECTED  I-Stat Venous Blood Gas, ED (order at [facility] and [facility] only)  Result Value Ref Range   pH, Ven 7.410 7.250 - 7.430   pCO2, Ven 34.2 (L) 44.0 - 60.0 mmHg   pO2, Ven 64.0 (H) 32.0 - 45.0 mmHg   Bicarbonate 21.7 20.0 - 28.0 mmol/L   TCO2 23 22 - 32 mmol/L   O2 Saturation 92.0 %   Acid-base deficit 2.0 0.0 - 2.0 mmol/L   Patient temperature HIDE    Sample type VENOUS    No results found. EKG None  Radiology No results found.  Procedures Procedures (including critical care time)  Medications Ordered in ED Medications - No data to display   Initial Impression / Assessment and Plan / ED Course  I have reviewed the triage vital signs and the nursing notes.  Pertinent labs & imaging results that were available during my care of the patient were reviewed by me and considered in my medical decision making (see  chart for details).   15:19: Dallie Dad RN discussed with poison control- her note has been reviewed: "Per Poison Control:  Please continue to monitor BUN/createnine, acid/bass status, repeat Aspirin level at 1600.  Recommend adding magnesium.  Recommend blood gas (vein or artery).  Offer patient charcoal depending on possible consumption??  Need to review hx of ingestion.  Urine PH recommended.  Cardiac monitor with EKG.  Repeat tylenol with aspirin repeat.  Recommend not giving any benzos or medicines that will decrease respiratory status (potential for acidosis).  Fluid bolus for urine output.  Supportive care for any other symptoms  Look out for: tachycardia, diaphoresis, and tinnitus."  Patient reports ingestion of 81 mg ASA this AM as well as one goodie powder. He denies palpitations or tinnitus. On exam heart with RRR, rate is in the upper limits of normal. Patient does not appear diaphoretic. He is somewhat slow to respond,  irritated/agitated by my questions at times, and hitting himself in the head with frustration which he has been doing previously. Orders have been placed for add on magnesium, UA, and venous blood gas. NS 1L bolus ordered. Orders placed for repeat salicylate, acetaminophen, and BMP at 1600.   Labs reviewed. Salicylate level is improved, acetaminophen remains WNL. Magnesium WNL. Urine pH is normal. Blood gas is not consistent with significant acidosis.   Patient is medically cleared for TTS. His home med list is extensive however he confirmed taking all medication as prescribed and as listed in his chart therefore home meds re-ordered as listed to avoid withdrawal from any of his psychiatric medications. Disposition per behavioral health.   Discussed with Delrae Sawyers with behavioral health- recommended inpatient admission for the patient.   Findings and plan of care discussed with supervising physician Dr. Gilford Raid who personally evaluated patient and is in agreement.    Final Clinical Impressions(s) / ED Diagnoses   Final diagnoses:  Suicidal ideation    ED Discharge Orders    None       Amaryllis Dyke, PA-C 07/09/17 0155    Isla Pence, MD 07/12/17 [phone]

## 2017-07-08 NOTE — ED Triage Notes (Signed)
Pt c.o SI with plan to shoot himself, no access to guns at home. No HI at this time but states "at times" towards no particular person.

## 2017-07-08 NOTE — ED Notes (Signed)
Patient denies pain and is resting comfortably.  

## 2017-07-08 NOTE — ED Notes (Signed)
Pt in room hitting himself. When ask why he is hitting himself, pt states "I don't know".

## 2017-07-08 NOTE — BH Assessment (Signed)
Received call from [place] at [facility] stating Pt has been accepted to 1-East unit at their facility by Dr. Lynnell Grain. Pt can arrive after 1000. Number for nursing report is [phone]. Notified Beatris Ship, RN at [facility] who said she would notify staff in the morning.   Orpah Greek Anson Fret, LPC, [facility], [facility] Triage Specialist [phone]

## 2017-07-08 NOTE — BHH Counselor (Signed)
Clinician called tele-assessment cart to complete pt's TTS consult, to no avail. Clinician to call back.   Vertell Novak, MS, [facility], [facility] Triage Specialist [phone]

## 2017-07-08 NOTE — ED Notes (Signed)
Staffing notified of need for sitter, stated sitter is on the way

## 2017-07-09 LAB — CBG MONITORING, ED: Glucose-Capillary: 140 mg/dL — ABNORMAL HIGH (ref 65–99)

## 2017-07-09 NOTE — ED Triage Notes (Signed)
CBG 140  

## 2017-07-09 NOTE — ED Triage Notes (Signed)
TC to [facility] port @ 103 for trans port to [facility]

## 2017-07-09 NOTE — ED Notes (Signed)
Breakfast tray ordered 

## 2017-07-09 NOTE — ED Notes (Addendum)
Pt sleeping. Will continue to monitor. Sitter at bedside.

## 2017-07-09 NOTE — ED Notes (Signed)
Pt finally calm at this time. Laying in bed, quiet. Sitter at bedside. Will continue to monitor.

## 2017-07-09 NOTE — ED Notes (Addendum)
Pt sleeping well currently. NAD. Sitter at bedside.

## 2017-07-09 NOTE — ED Notes (Signed)
Pt urinated all over the bathroom floor. Environmental services called. Cleaned up restroom. Will wait for mop.

## 2017-07-09 NOTE — ED Notes (Addendum)
Ford from [facility] called. Pt has been accepted to [facility]. Pt can go anytime after 1000 tomorrow (07/09/17).

## 2017-07-09 NOTE — ED Triage Notes (Signed)
REport call to [facility] , [address]

## 2017-07-14 NOTE — ED Notes (Signed)
Found 1 labeled belongings bag in [place] Room. Called [facility] - verified pt is still there and requested for them to advise pt he may pick up his belongings when d/c'd. Bag placed in [facility] Room.

## 2017-07-22 ENCOUNTER — Other Ambulatory Visit: Payer: Self-pay | Admitting: Psychiatry

## 2017-08-03 DIAGNOSIS — L0291 Cutaneous abscess, unspecified: Secondary | ICD-10-CM | POA: Diagnosis not present

## 2017-08-03 DIAGNOSIS — E1165 Type 2 diabetes mellitus with hyperglycemia: Secondary | ICD-10-CM | POA: Diagnosis not present

## 2017-08-13 DIAGNOSIS — L97921 Non-pressure chronic ulcer of unspecified part of left lower leg limited to breakdown of skin: Secondary | ICD-10-CM | POA: Diagnosis not present

## 2017-08-13 DIAGNOSIS — I2510 Atherosclerotic heart disease of native coronary artery without angina pectoris: Secondary | ICD-10-CM | POA: Diagnosis not present

## 2017-08-13 DIAGNOSIS — E1165 Type 2 diabetes mellitus with hyperglycemia: Secondary | ICD-10-CM | POA: Diagnosis not present

## 2017-10-09 IMAGING — CR DG ELBOW COMPLETE 3+V*R*
1 series · 4 of 4 positions shown · non-contrast
Comparison: None.

CLINICAL DATA: Right arm redness, swelling and pain for 3 days.

EXAM:
RIGHT ELBOW - COMPLETE 3+ VIEW

[Series 1: dg elbow complete right · 0.14mm/px · 4 of 4 slices shown]
[im 1/4]
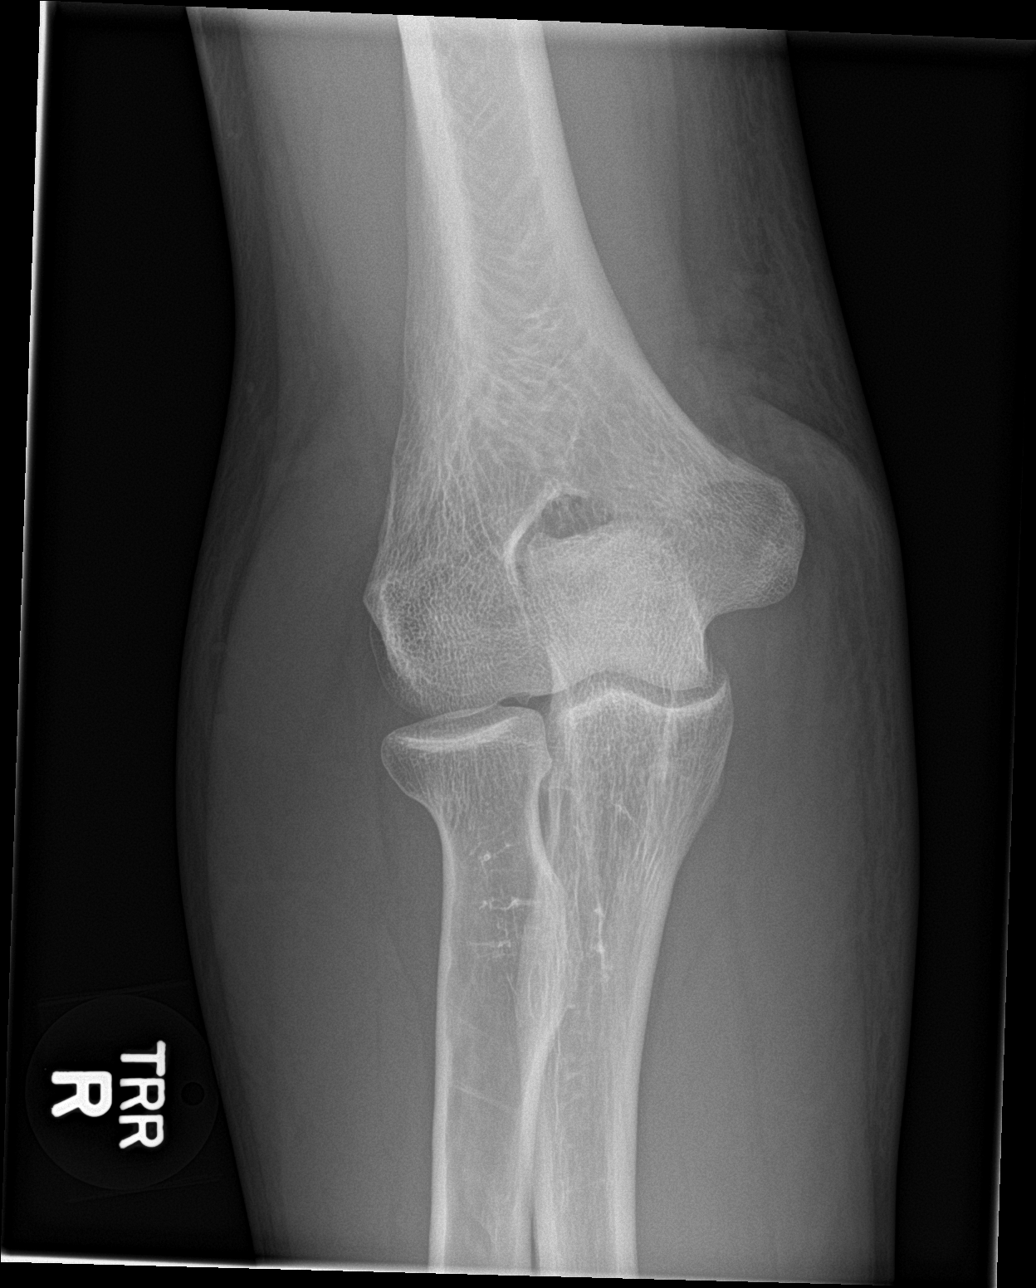
[im 2/4]
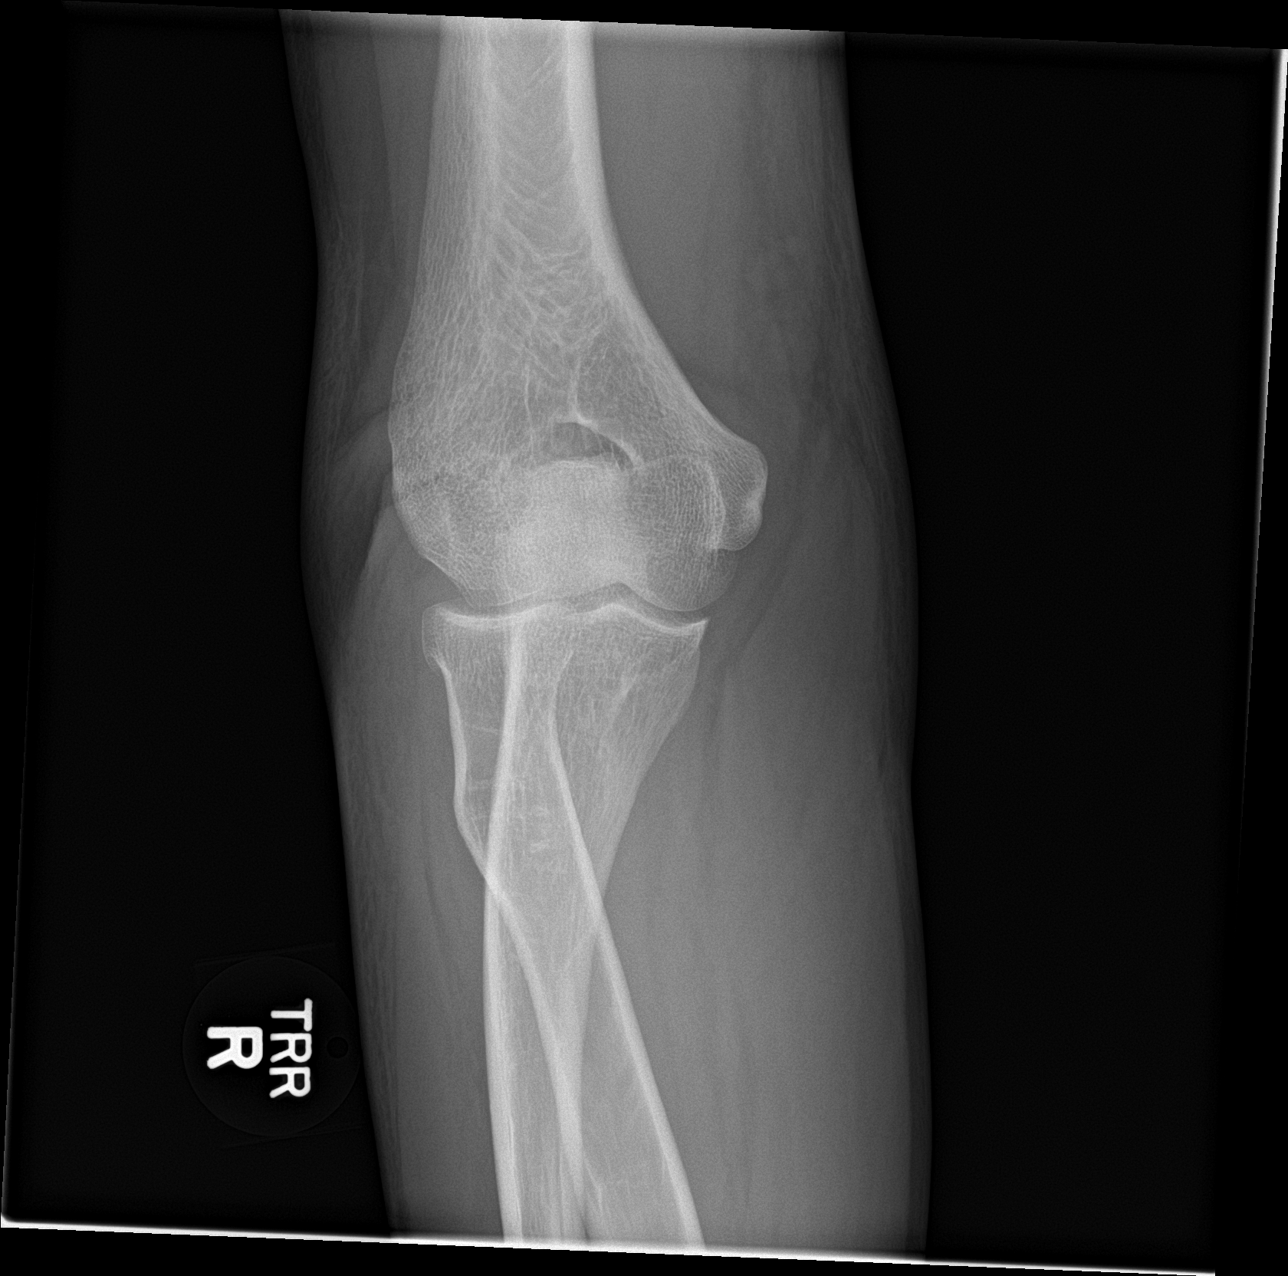
[im 3/4]
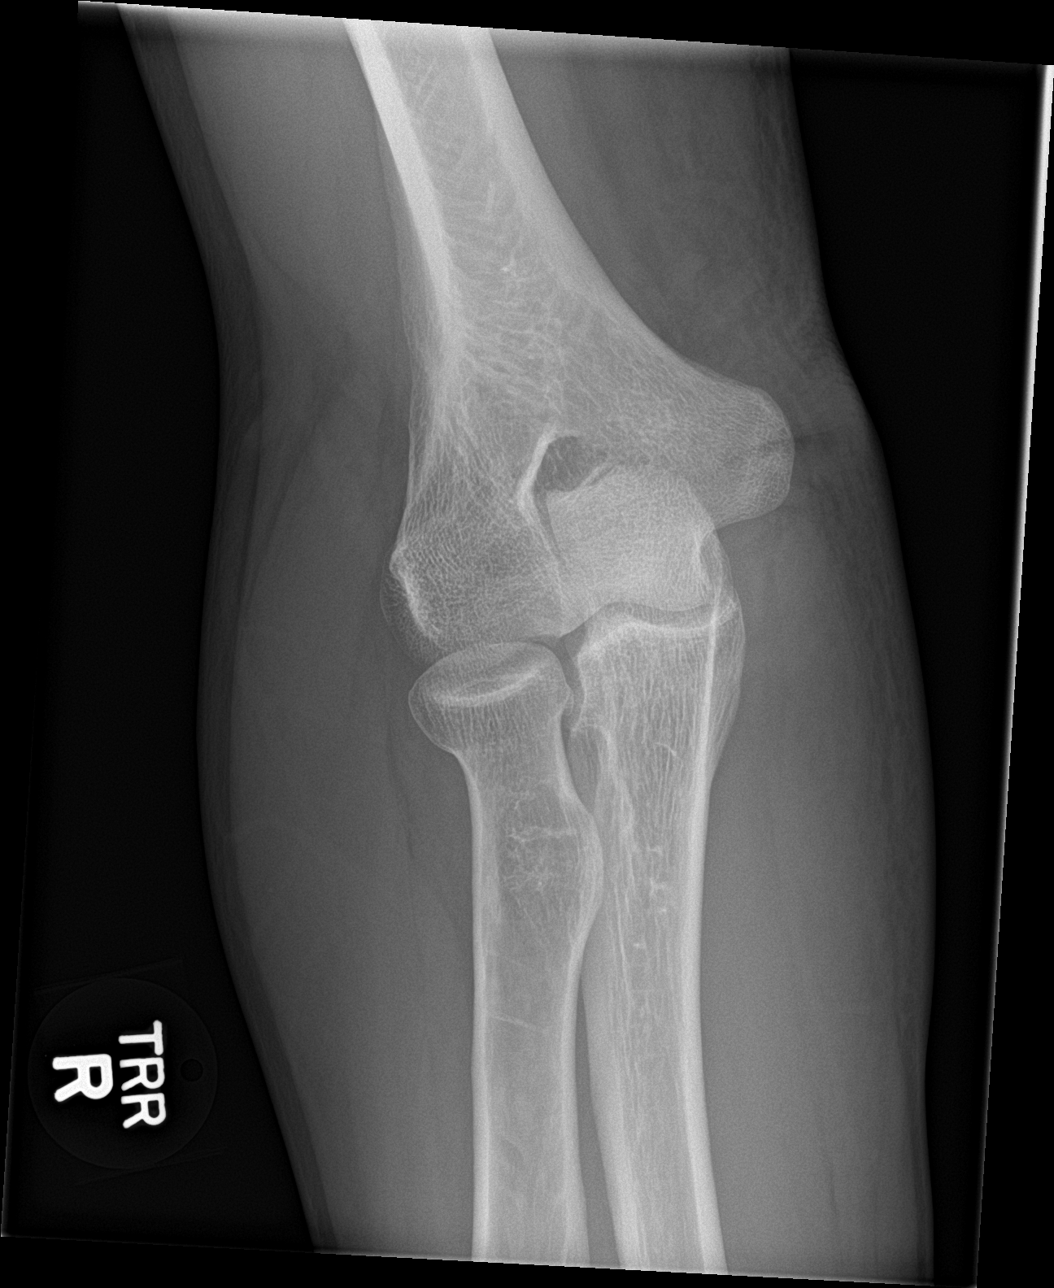
[im 4/4]
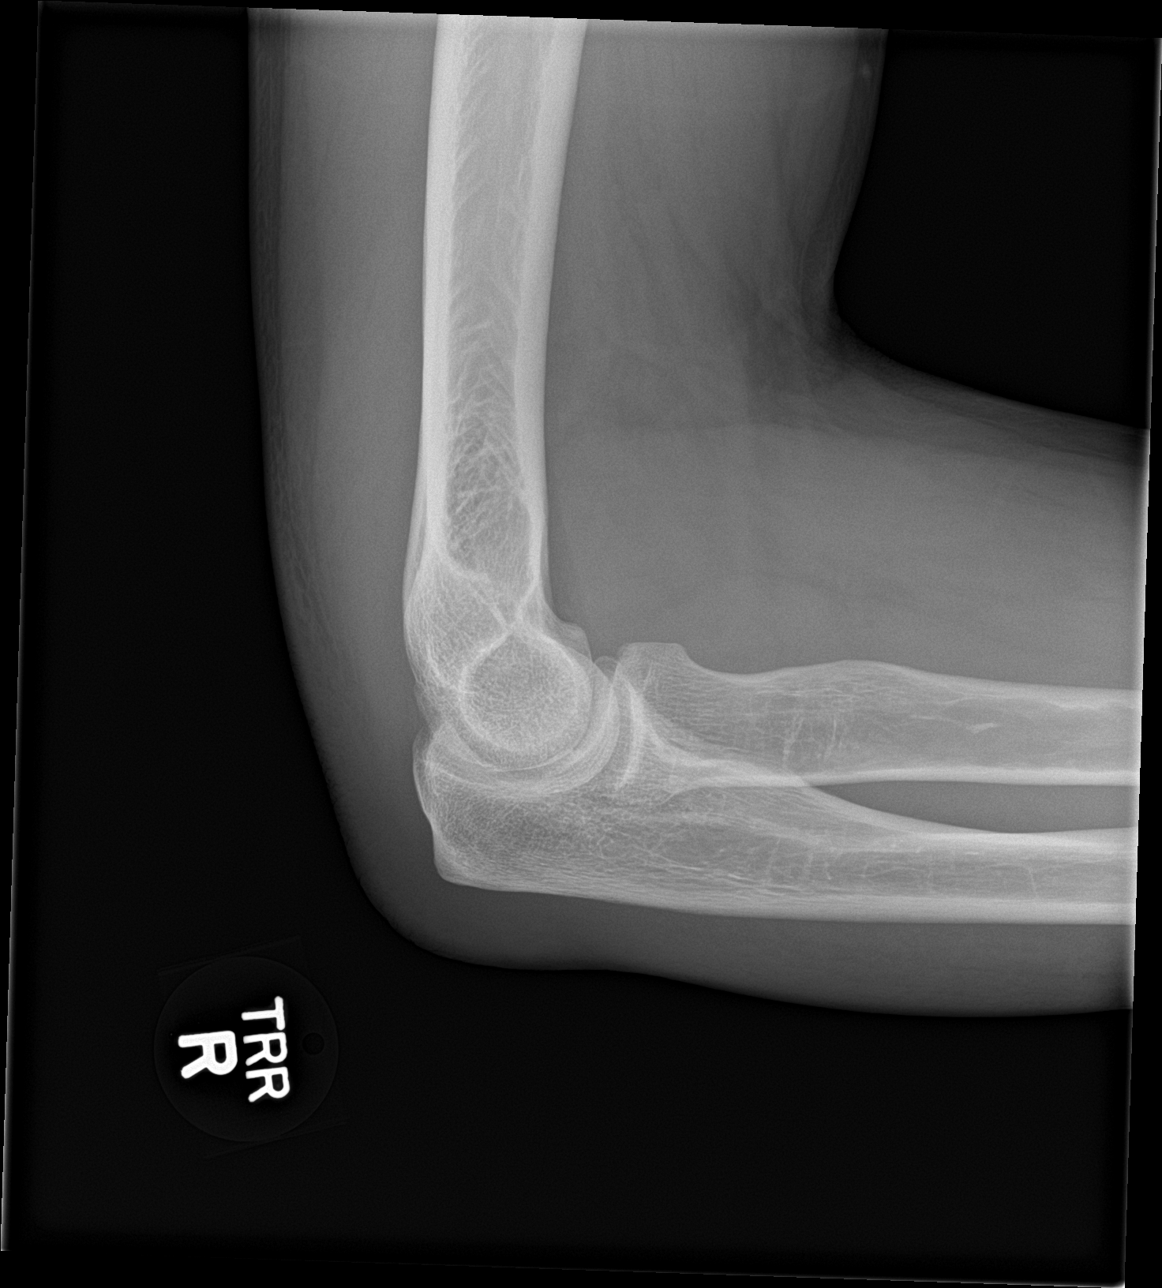

[4 of 4 positions shown; findings below may reference images not displayed]

FINDINGS: There is no evidence of fracture, dislocation, or joint effusion.
There is no evidence of arthropathy or other focal bone abnormality.
Soft tissues are unremarkable.
IMPRESSION: No acute fracture or dislocation.

## 2017-10-21 IMAGING — US US ABDOMEN LIMITED
1 series · 14 of 25 positions shown · non-contrast
Comparison: Abdominal and pelvic CT scan August 08, 2015.

CLINICAL DATA: Ten days of right upper quadrant pain associated
with nausea.

EXAM:
US ABDOMEN LIMITED - RIGHT UPPER QUADRANT

[Series 1: us abdomen limited · 0.25mm/px · 14 of 40 slices shown]
[im 1/40]
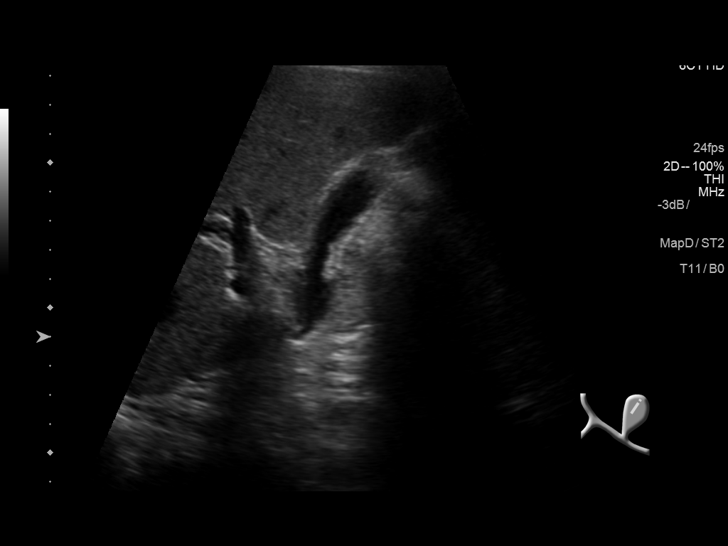
[im 4/40]
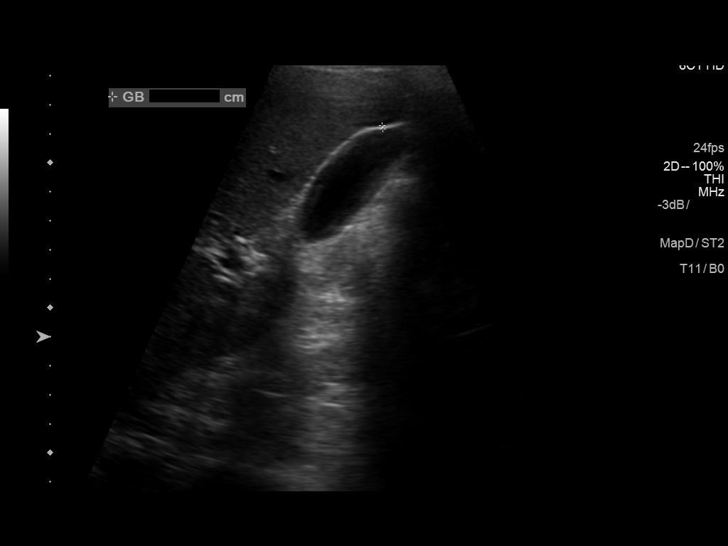
[im 7/40]
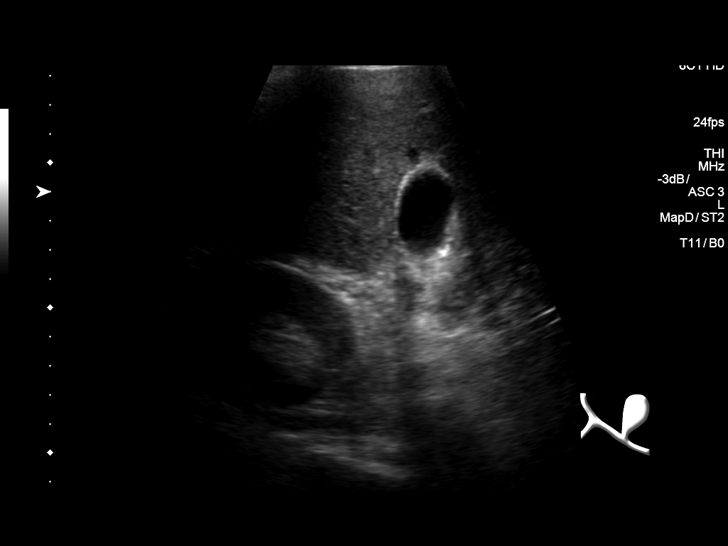
[im 10/40]
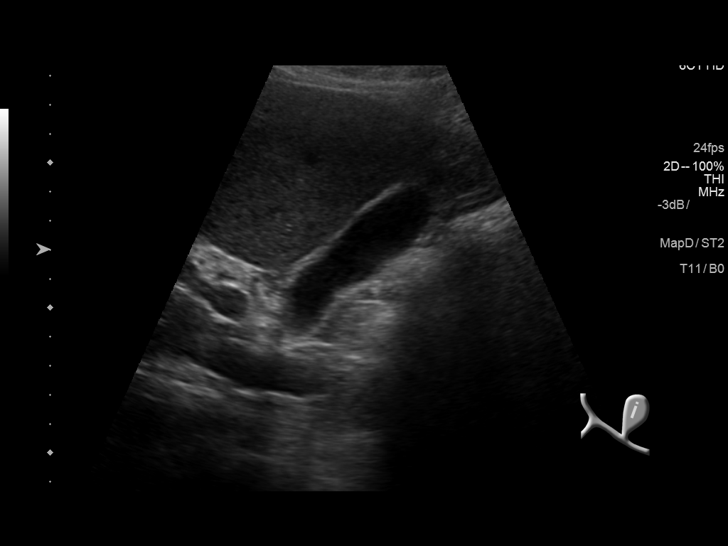
[im 14/40]
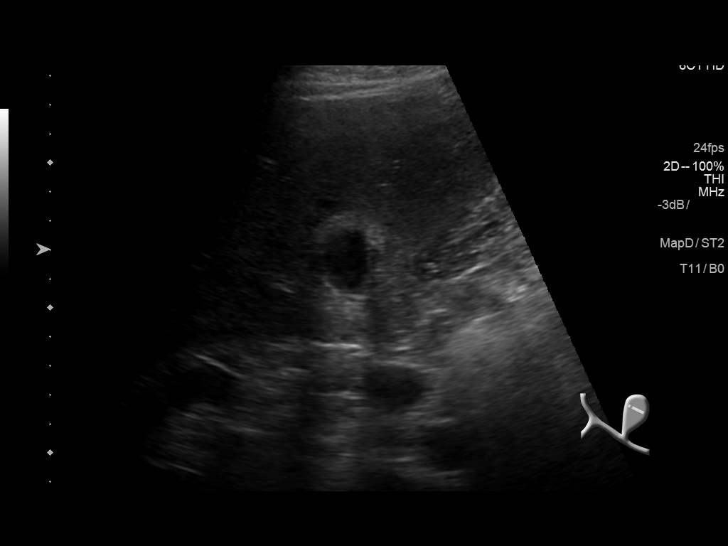
[im 15/40]
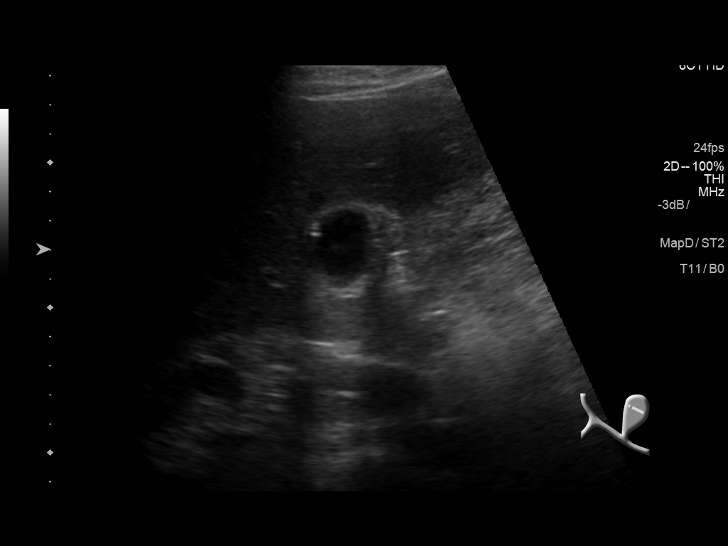
[im 18/40]
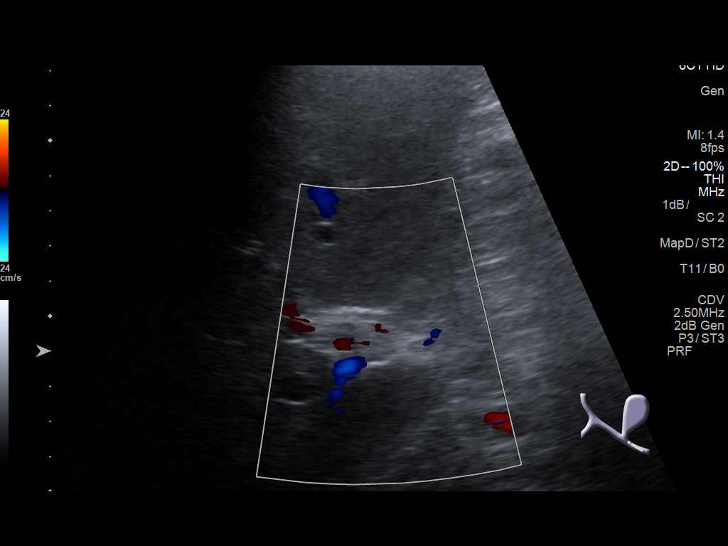
[im 22/40]
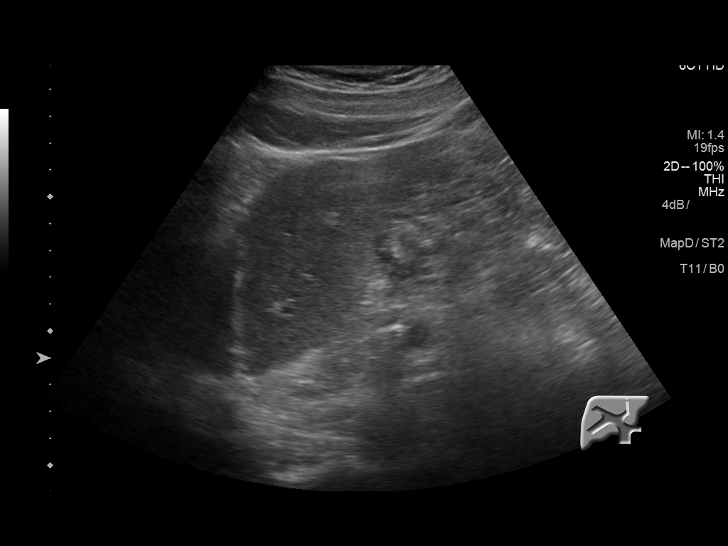
[im 25/40]
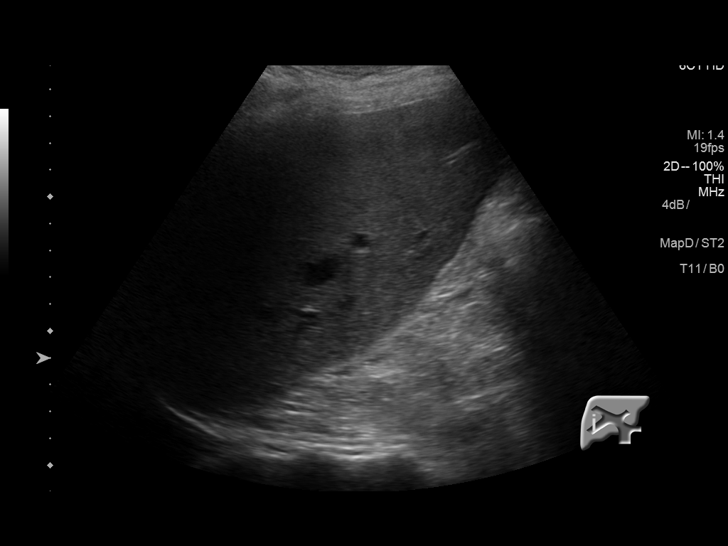
[im 27/40]
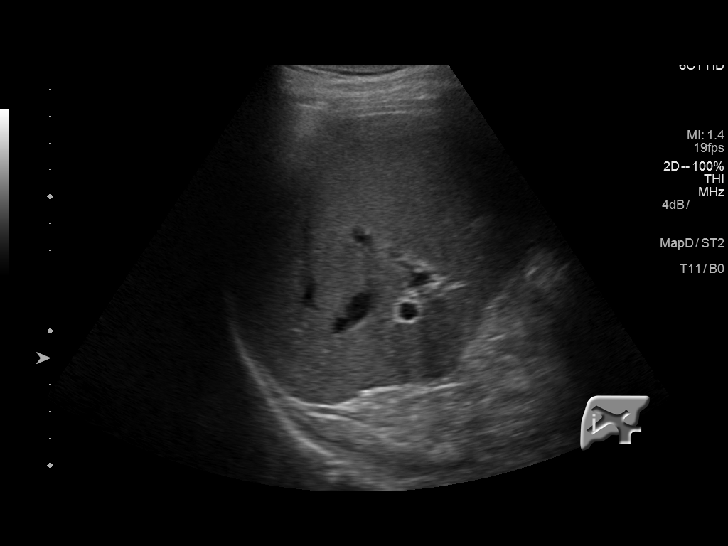
[im 30/40]
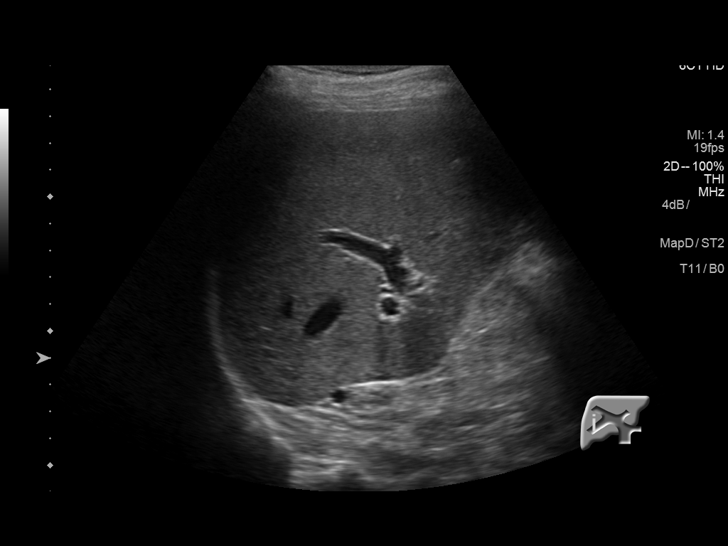
[im 33/40]
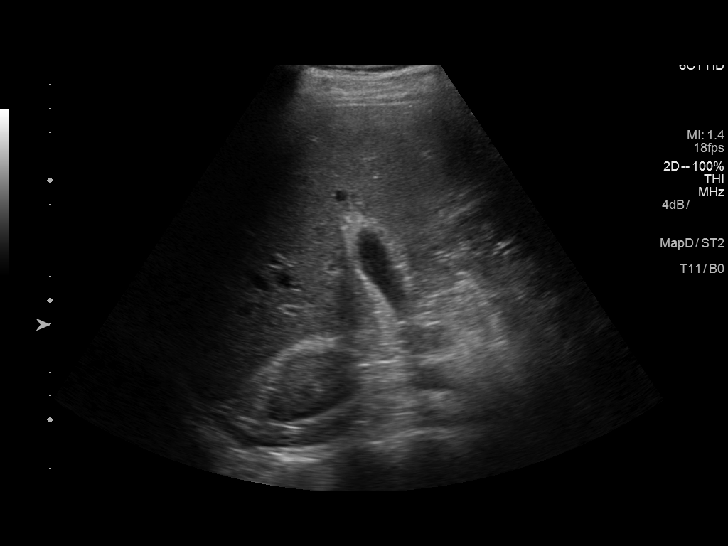
[im 36/40]
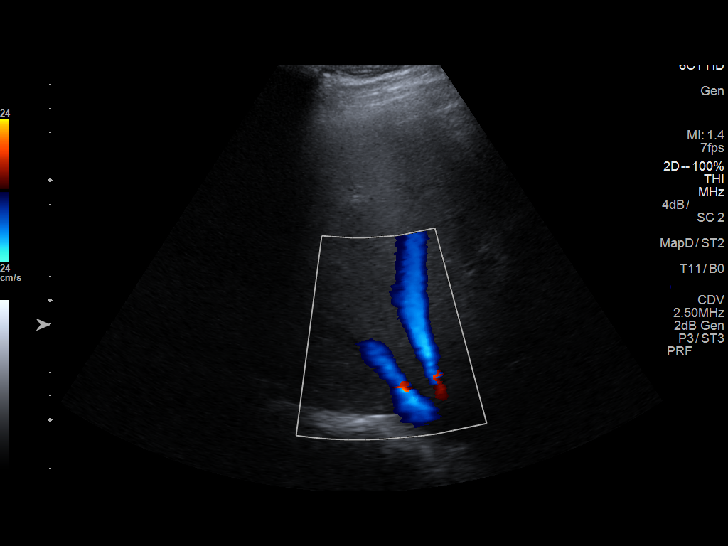
[im 40/40]
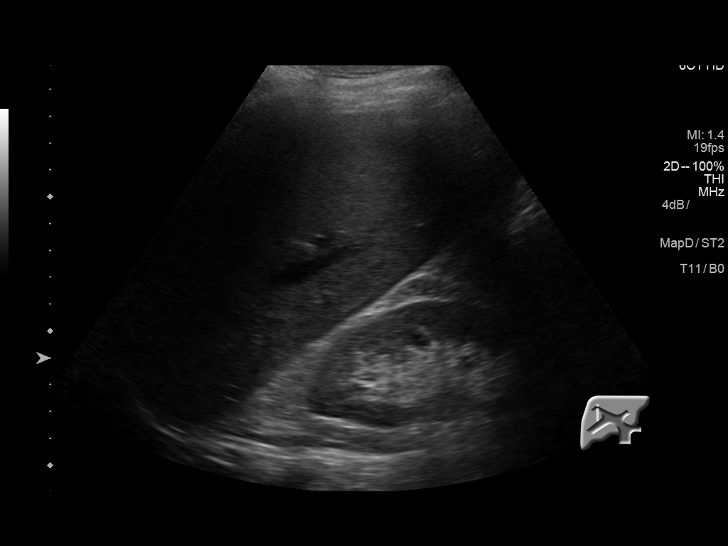

[14 of 25 positions shown; findings below may reference images not displayed]

FINDINGS: Gallbladder:

No gallstones or wall thickening visualized. No sonographic Murphy
sign noted by sonographer.

Common bile duct:

Diameter: 2.7 mm

Liver:

No focal lesion identified. Within normal limits in parenchymal
echogenicity.
IMPRESSION: Normal limited right upper quadrant abdominal ultrasound. If there
are clinical concerns of gallbladder dysfunction, a nuclear medicine
hepatobiliary scan may be useful.

## 2017-10-24 ENCOUNTER — Emergency Department
Admission: EM | Admit: 2017-10-24 | Discharge: 2017-10-24 | Disposition: A | Discharge status: Home / Self Care | Payer: Medicare Other | Attending: Emergency Medicine | Admitting: Emergency Medicine

## 2017-10-24 DIAGNOSIS — F17210 Nicotine dependence, cigarettes, uncomplicated: Secondary | ICD-10-CM | POA: Insufficient documentation

## 2017-10-24 DIAGNOSIS — I2510 Atherosclerotic heart disease of native coronary artery without angina pectoris: Secondary | ICD-10-CM | POA: Insufficient documentation

## 2017-10-24 DIAGNOSIS — I252 Old myocardial infarction: Secondary | ICD-10-CM | POA: Diagnosis not present

## 2017-10-24 DIAGNOSIS — Z0000 Encounter for general adult medical examination without abnormal findings: Secondary | ICD-10-CM | POA: Diagnosis not present

## 2017-10-24 DIAGNOSIS — E1122 Type 2 diabetes mellitus with diabetic chronic kidney disease: Secondary | ICD-10-CM | POA: Insufficient documentation

## 2017-10-24 DIAGNOSIS — Z9119 Patient's noncompliance with other medical treatment and regimen: Secondary | ICD-10-CM | POA: Diagnosis not present

## 2017-10-24 DIAGNOSIS — I129 Hypertensive chronic kidney disease with stage 1 through stage 4 chronic kidney disease, or unspecified chronic kidney disease: Secondary | ICD-10-CM | POA: Insufficient documentation

## 2017-10-24 DIAGNOSIS — L089 Local infection of the skin and subcutaneous tissue, unspecified: Secondary | ICD-10-CM | POA: Diagnosis not present

## 2017-10-24 DIAGNOSIS — N189 Chronic kidney disease, unspecified: Secondary | ICD-10-CM | POA: Diagnosis not present

## 2017-10-24 DIAGNOSIS — L98499 Non-pressure chronic ulcer of skin of other sites with unspecified severity: Secondary | ICD-10-CM | POA: Diagnosis not present

## 2017-10-24 DIAGNOSIS — Z139 Encounter for screening, unspecified: Secondary | ICD-10-CM | POA: Diagnosis not present

## 2017-10-24 DIAGNOSIS — Z9114 Patient's other noncompliance with medication regimen: Secondary | ICD-10-CM | POA: Insufficient documentation

## 2017-10-24 MED ORDER — FLUMAZENIL 0.5 MG/5ML IV SOLN
0.2000 mg | Freq: Once | INTRAVENOUS | Status: AC
  Administered 2017-10-24: 0.2 mg via INTRAVENOUS
  Filled 2017-10-24: qty 5

## 2017-10-24 NOTE — ED Provider Notes (Addendum)
[facility] Emergency Department Provider Note       Time seen: ----------------------------------------- 4:50 PM on 10/24/2017 -----------------------------------------   I have reviewed the triage vital signs and the nursing notes.  HISTORY   Chief Complaint No chief complaint on file.    HPI Michael Schmitt is a 47 y.o. male with a history of anxiety, arthritis, coronary artery disease, chronic kidney disease, diabetes, hyperlipidemia, hypertension who presents to the ED for possible overdose.  Patient had taken approximately 6 Klonopin and prior to seeing his doctor today.  He denies wanting to harm himself.  Patient is well-known to the ER and has a chronic history of suicidal ideation.  Patient states again that he was not trying to kill himself when he took 6 Klonopin.  Past Medical History:  Diagnosis Date  . Anxiety   . Anxiety   . Arthritis    cerv. stenosis, spondylosis, HNP- lower back , has been followed in pain clinic, has  had injection s in cerv. area  . Blood dyscrasia    told that when he was young he was a" free bleeder"  . CAD (coronary artery disease)   . Cervical spondylosis without myelopathy 07/24/2014  . Cervicogenic headache 07/24/2014  . Chronic kidney disease    renal calculi- passed spontaneously  . Depression   . Diabetes mellitus without complication ([place])   . Fatty liver   . GERD (gastroesophageal reflux disease)   . Headache(784.0)   . Hyperlipidemia   . Hypertension   . Mental disorder   . MI, old   . RLS (restless legs syndrome)    detected on sleep study  . Shortness of breath   . Ventricular fibrillation ([place]) 04/05/2016    Patient Active Problem List   Diagnosis Date Noted  . Severe recurrent major depression without psychotic features ([place]) 03/12/2017  . Generalized anxiety disorder 02/05/2017  . Chronic neck pain (Primary Area of Pain) (Left) 11/22/2016  . Cervical facet syndrome (Left) 11/22/2016   . Cervical radiculitis (Left) 11/22/2016  . History of cervical spinal surgery 11/22/2016  . Cervical spondylosis 11/22/2016  . Chronic shoulder pain (Secondary Area of Pain) (Left) 11/22/2016  . Arthropathy of shoulder (Left) 11/22/2016  . Chronic low back pain [facility] Area of Pain) (Bilateral) (L>R) 11/22/2016  . Lumbar facet syndrome (Bilateral) (L>R) 11/22/2016  . Chronic pain syndrome 11/21/2016  . Long term prescription benzodiazepine use 11/21/2016  . Opiate use 11/21/2016  . Uncontrolled type 2 diabetes mellitus without complication, without long-term current use of insulin ([place]) 10/19/2016  . Chronic Suicidal ideation 09/15/2016  . Coronary artery disease 09/12/2016  . HTN (hypertension) 06/26/2016  . GERD (gastroesophageal reflux disease) 06/26/2016  . Alcohol use disorder, severe, 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[place]) 06/26/2016  . Moderate benzodiazepine use disorder ([place]) 06/11/2016  . Sedative, hypnotic or anxiolytic use disorder, severe, dependence ([place]) 05/12/2016  . Ventricular fibrillation ([place]) 04/05/2016  . Combined hyperlipidemia 01/05/2016  . Cervical spondylosis without myelopathy 07/24/2014  . Severe episode of recurrent major depressive disorder, without psychotic features ([place]) 04/15/2013  . Tobacco use disorder 10/20/2010  . Asthma 10/20/2010    Past Surgical History:  Procedure Laterality Date  . ANTERIOR CERVICAL DECOMP/DISCECTOMY FUSION  11/13/2011   Procedure: ANTERIOR CERVICAL DECOMPRESSION/DISCECTOMY FUSION 1 LEVEL;  Surgeon: [name], MD;  Location: [facility];  Service: Neurosurgery;  Laterality: N/A;  Anterior Cervical Decompression/discectomy Fusion. Cervical three-four.  Marland Kitchen CARDIAC CATHETERIZATION N/A 01/01/2015   Procedure: Left Heart Cath and Coronary  Angiography;  Surgeon: [name], MD;  Location: [place] [facility];  Service: Cardiovascular;  Laterality: N/A;  . CARDIAC CATHETERIZATION N/A 04/05/2016   Procedure: Left Heart Cath and Coronary  Angiography;  Surgeon: Belva Crome, MD;  Location: [place] [facility];  Service: Cardiovascular;  Laterality: N/A;  . CARDIAC CATHETERIZATION N/A 04/05/2016   Procedure: Coronary Stent Intervention;  Surgeon: Belva Crome, MD;  Location: [place] [facility];  Service: Cardiovascular;  Laterality: N/A;  . CARDIAC CATHETERIZATION N/A 04/05/2016   Procedure: Intravascular Ultrasound/IVUS;  Surgeon: Belva Crome, MD;  Location: [place] [facility];  Service: Cardiovascular;  Laterality: N/A;  . NASAL SINUS SURGERY     2005    Allergies Prednisone; Hydrocodone; and Varenicline  Social History Social History   Tobacco Use  . Smoking status: Current Every Day Smoker    Packs/day: 2.00    Years: 17.00    Pack years: 34.00    Types: Cigarettes, Cigars  . Smokeless tobacco: Former Systems developer  . Tobacco comment: occassional snuff   Substance Use Topics  . Alcohol use: No    Alcohol/week: 0.0 oz    Comment: last drinked in 3 months.   . Drug use: No   Review of Systems Constitutional: Negative for fever. Cardiovascular: Negative for chest pain. Respiratory: Negative for shortness of breath. Gastrointestinal: Negative for abdominal pain, vomiting and diarrhea. Musculoskeletal: Negative for back pain. Skin: Negative for rash. Neurological: Negative for headaches, focal weakness or numbness. Psychiatric: Negative for suicidal ideation  All systems negative/normal/unremarkable except as stated in the HPI  ____________________________________________   PHYSICAL EXAM:  VITAL SIGNS: ED Triage Vitals  Enc Vitals Group     BP      Pulse      Resp      Temp      Temp src      SpO2      Weight      Height      Head Circumference      Peak Flow      Pain Score      Pain Loc      Pain Edu?      Excl. in [place]?    Constitutional: Lethargic but arouses easily to stimuli, no distress Eyes: Conjunctivae are normal. Normal extraocular movements. ENT   Head: Normocephalic and  atraumatic.   Nose: No congestion/rhinnorhea.   Mouth/Throat: Mucous membranes are moist.   Neck: No stridor. Cardiovascular: Normal rate, regular rhythm. No murmurs, rubs, or gallops. Respiratory: Normal respiratory effort without tachypnea nor retractions. Breath sounds are clear and equal bilaterally. No wheezes/rales/rhonchi. Gastrointestinal: Soft and nontender. Normal bowel sounds Musculoskeletal: Nontender with normal range of motion in extremities. No lower extremity tenderness nor edema. Neurologic:  Normal speech and language. No gross focal neurologic deficits are appreciated.  Skin:  Skin is warm, dry and intact. No rash noted. Psychiatric: Mood and affect are normal. Speech and behavior are normal.  ____________________________________________  ED COURSE:  As part of my medical decision making, I reviewed the following data within the [place] History obtained from family if available, nursing notes, old chart and ekg, as well as notes from prior ED visits. Patient presented for possible overdose, we will assess with labs and imaging as indicated at this time.   Procedures ____________________________________________  DIFFERENTIAL DIAGNOSIS   Overdose, intentional versus accidental, malingering, depression, suicidal ideation  FINAL ASSESSMENT AND PLAN  Medication noncompliance   Plan: The patient had presented  for lethargy after admitting to his primary care doctor and his sister that he took several Klonopin as well as Thorazine.  He was given a dose of flumazenil here with some improvement in his mental status but he still appears drowsy.  He denies any suicidal or homicidal ideation at this time.  Again he is well-known to the ER staff typically from nearly permanent suicidal ideation which he is denying at this time.  He has poor judgment which is a chronic issue and there are no other acute findings at this time, he is ambulating without  any difficulty.  Laurence Aly, MD   Note: This note was generated in part or whole with voice recognition software. Voice recognition is usually quite accurate but there are transcription errors that can and very often do occur. I apologize for any typographical errors that were not detected and corrected.     Earleen Newport, MD 10/24/17 1755    Earleen Newport, MD 10/24/17 [phone]

## 2017-10-24 NOTE — ED Notes (Signed)
Brought her by [employer] , pt was patient and sister were at the PCP office and patient admitted to taking 5 or more klonopin today and possibly another substance . Patient is having homicidal thoughts towards family members and SI. Patient threatened male EMT technician, "if you take me to [facility] hospital I am going to kick your ass", Pt is alert, and oriented. Patient stated when talking to writer, " I need help, but I want to go home".

## 2017-10-24 NOTE — ED Notes (Signed)
Patient discharged home, patient received discharge papers. Patient received belongings and verbalized he has received all of his belongings. Patient appropriate and cooperative, Denies SI/HI AVH. Vital signs taken. NAD noted.

## 2017-10-24 NOTE — ED Notes (Signed)
Patient continues to be very drowsy, given flumazenil (ROMAZICON) injection 0.2 mg IV. MD came in and talked with patient and patient discussed he also had taken Tramadol with the suspected Klonopin.

## 2017-10-24 NOTE — ED Notes (Signed)
Patient Belongings: [place] Cables and black shorts Orange tshirt Pair socks Orange boxers [employer] Klonopin 0.5mg 

## 2017-10-24 NOTE — ED Notes (Signed)
Patient Belongings: [place] Cables and black shorts Orange tshirt Pair socks Orange boxers [employer] Pill bottle of Klonopin 0.5mg  (23 pills verified pill bottle and count with Felicia, MHT)

## 2017-10-26 ENCOUNTER — Emergency Department
Admission: EM | Admit: 2017-10-26 | Discharge: 2017-10-26 | Disposition: A | Discharge status: Home / Self Care | Payer: Medicare Other | Attending: Emergency Medicine | Admitting: Emergency Medicine

## 2017-10-26 ENCOUNTER — Encounter: Payer: Self-pay | Admitting: Medical Oncology

## 2017-10-26 DIAGNOSIS — Z79899 Other long term (current) drug therapy: Secondary | ICD-10-CM | POA: Diagnosis not present

## 2017-10-26 DIAGNOSIS — F1320 Sedative, hypnotic or anxiolytic dependence, uncomplicated: Secondary | ICD-10-CM | POA: Diagnosis present

## 2017-10-26 DIAGNOSIS — F341 Dysthymic disorder: Secondary | ICD-10-CM

## 2017-10-26 DIAGNOSIS — E119 Type 2 diabetes mellitus without complications: Secondary | ICD-10-CM | POA: Diagnosis present

## 2017-10-26 DIAGNOSIS — N189 Chronic kidney disease, unspecified: Secondary | ICD-10-CM | POA: Insufficient documentation

## 2017-10-26 DIAGNOSIS — F17290 Nicotine dependence, other tobacco product, uncomplicated: Secondary | ICD-10-CM | POA: Diagnosis not present

## 2017-10-26 DIAGNOSIS — I129 Hypertensive chronic kidney disease with stage 1 through stage 4 chronic kidney disease, or unspecified chronic kidney disease: Secondary | ICD-10-CM | POA: Insufficient documentation

## 2017-10-26 DIAGNOSIS — I2510 Atherosclerotic heart disease of native coronary artery without angina pectoris: Secondary | ICD-10-CM | POA: Diagnosis not present

## 2017-10-26 DIAGNOSIS — F411 Generalized anxiety disorder: Secondary | ICD-10-CM | POA: Diagnosis present

## 2017-10-26 DIAGNOSIS — E1165 Type 2 diabetes mellitus with hyperglycemia: Secondary | ICD-10-CM

## 2017-10-26 DIAGNOSIS — Z7984 Long term (current) use of oral hypoglycemic drugs: Secondary | ICD-10-CM | POA: Insufficient documentation

## 2017-10-26 DIAGNOSIS — Z7902 Long term (current) use of antithrombotics/antiplatelets: Secondary | ICD-10-CM | POA: Insufficient documentation

## 2017-10-26 DIAGNOSIS — F17210 Nicotine dependence, cigarettes, uncomplicated: Secondary | ICD-10-CM | POA: Diagnosis not present

## 2017-10-26 DIAGNOSIS — R45851 Suicidal ideations: Secondary | ICD-10-CM | POA: Diagnosis not present

## 2017-10-26 DIAGNOSIS — J45909 Unspecified asthma, uncomplicated: Secondary | ICD-10-CM | POA: Insufficient documentation

## 2017-10-26 DIAGNOSIS — F329 Major depressive disorder, single episode, unspecified: Secondary | ICD-10-CM | POA: Diagnosis not present

## 2017-10-26 DIAGNOSIS — Z7982 Long term (current) use of aspirin: Secondary | ICD-10-CM | POA: Insufficient documentation

## 2017-10-26 DIAGNOSIS — F1021 Alcohol dependence, in remission: Secondary | ICD-10-CM

## 2017-10-26 LAB — CBC
HEMATOCRIT: 39.4 % — AB (ref 40.0–52.0)
HEMOGLOBIN: 13.2 g/dL (ref 13.0–18.0)
MCH: 30.7 pg (ref 26.0–34.0)
MCHC: 33.6 g/dL (ref 32.0–36.0)
MCV: 91.6 fL (ref 80.0–100.0)
Platelets: 257 10*3/uL (ref 150–440)
RBC: 4.3 MIL/uL — ABNORMAL LOW (ref 4.40–5.90)
RDW: 14.7 % — ABNORMAL HIGH (ref 11.5–14.5)
WBC: 11.6 10*3/uL — AB (ref 3.8–10.6)

## 2017-10-26 LAB — COMPREHENSIVE METABOLIC PANEL
ALT: 41 U/L (ref 0–44)
ANION GAP: 9 (ref 5–15)
AST: 36 U/L (ref 15–41)
Albumin: 4.2 g/dL (ref 3.5–5.0)
Alkaline Phosphatase: 114 U/L (ref 38–126)
BUN: 10 mg/dL (ref 6–20)
CHLORIDE: 107 mmol/L (ref 98–111)
CO2: 24 mmol/L (ref 22–32)
Calcium: 9.8 mg/dL (ref 8.9–10.3)
Creatinine, Ser: 0.88 mg/dL (ref 0.61–1.24)
GFR calc non Af Amer: >60 mL/min (ref >60)
Glucose, Bld: 190 mg/dL — ABNORMAL HIGH (ref 70–99)
Potassium: 3.7 mmol/L (ref 3.5–5.1)
Sodium: 140 mmol/L (ref 135–145)
Total Bilirubin: 0.4 mg/dL (ref 0.3–1.2)
Total Protein: 6.7 g/dL (ref 6.5–8.1)

## 2017-10-26 LAB — URINE DRUG SCREEN, QUALITATIVE (ARMC ONLY)
AMPHETAMINES, UR SCREEN: NOT DETECTED
Barbiturates, Ur Screen: NOT DETECTED
COCAINE METABOLITE, UR ~~LOC~~: NOT DETECTED
Cannabinoid 50 Ng, Ur ~~LOC~~: NOT DETECTED
MDMA (Ecstasy)Ur Screen: NOT DETECTED
METHADONE SCREEN, URINE: NOT DETECTED
OPIATE, UR SCREEN: NOT DETECTED
Phencyclidine (PCP) Ur S: NOT DETECTED
Tricyclic, Ur Screen: POSITIVE — AB

## 2017-10-26 LAB — SALICYLATE LEVEL: Salicylate Lvl: <7 mg/dL (ref 2.8–30.0)

## 2017-10-26 LAB — ACETAMINOPHEN LEVEL

## 2017-10-26 LAB — ETHANOL

## 2017-10-26 NOTE — ED Notes (Signed)
Pt discharged to lobby (pastor will give him a ride home).  Discharge paperwork and letter from psychiatrist given to patient. VS stable. All belongigns given to patient.
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
## 2017-10-26 NOTE — ED Provider Notes (Signed)
[facility] Emergency Department Provider Note ____________________________________________   First MD Initiated Contact with Patient 10/26/17 0900     (approximate)  I have reviewed the triage vital signs and the nursing notes.   HISTORY  Chief Complaint Suicidal    HPI Ender Rorke is a 47 y.o. male with PMH as noted below who presents with suicidal ideation, chronic in duration, gradual onset, and not associated with a specific plan at this time.  The patient states that he is primarily seeking referral to a long-term mental health facility.  He denies any acute medical complaints.  Past Medical History:  Diagnosis Date  . Anxiety   . Anxiety   . Arthritis    cerv. stenosis, spondylosis, HNP- lower back , has been followed in pain clinic, has  had injection s in cerv. area  . Blood dyscrasia    told that when he was young he was a" free bleeder"  . CAD (coronary artery disease)   . Cervical spondylosis without myelopathy 07/24/2014  . Cervicogenic headache 07/24/2014  . Chronic kidney disease    renal calculi- passed spontaneously  . Depression   . Diabetes mellitus without complication ([place])   . Fatty liver   . GERD (gastroesophageal reflux disease)   . Headache(784.0)   . Hyperlipidemia   . Hypertension   . Mental disorder   . MI, old   . RLS (restless legs syndrome)    detected on sleep study  . Shortness of breath   . Ventricular fibrillation ([place]) 04/05/2016    Patient Active Problem List   Diagnosis Date Noted  . Severe recurrent major depression without psychotic features ([place]) 03/12/2017  . Generalized anxiety disorder 02/05/2017  . Chronic neck pain (Primary Area of Pain) (Left) 11/22/2016  . Cervical facet syndrome (Left) 11/22/2016  . Cervical radiculitis (Left) 11/22/2016  . History of cervical spinal surgery 11/22/2016  . Cervical spondylosis 11/22/2016  . Chronic shoulder pain (Secondary Area of Pain) (Left)  11/22/2016  . Arthropathy of shoulder (Left) 11/22/2016  . Chronic low back pain [facility] Area of Pain) (Bilateral) (L>R) 11/22/2016  . Lumbar facet syndrome (Bilateral) (L>R) 11/22/2016  . Chronic pain syndrome 11/21/2016  . Long term prescription benzodiazepine use 11/21/2016  . Opiate use 11/21/2016  . Uncontrolled type 2 diabetes mellitus without complication, without long-term current use of insulin ([place]) 10/19/2016  . Chronic Suicidal ideation 09/15/2016  . Coronary artery disease 09/12/2016  . HTN (hypertension) 06/26/2016  . GERD (gastroesophageal reflux disease) 06/26/2016  . Alcohol use disorder, severe, 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[place]) 06/26/2016  . Moderate benzodiazepine use disorder ([place]) 06/11/2016  . Sedative, hypnotic or anxiolytic use disorder, severe, dependence ([place]) 05/12/2016  . Ventricular fibrillation ([place]) 04/05/2016  . Combined hyperlipidemia 01/05/2016  . Cervical spondylosis without myelopathy 07/24/2014  . Severe episode of recurrent major depressive disorder, without psychotic features ([place]) 04/15/2013  . Tobacco use disorder 10/20/2010  . Asthma 10/20/2010    Past Surgical History:  Procedure Laterality Date  . ANTERIOR CERVICAL DECOMP/DISCECTOMY FUSION  11/13/2011   Procedure: ANTERIOR CERVICAL DECOMPRESSION/DISCECTOMY FUSION 1 LEVEL;  Surgeon: [name], MD;  Location: [place] [facility]UR[facility] OR[facility];  Service: Neurosurgery;  Laterality: N/A;  Anterior Cervical Decompression/discectomy Fusion. Cervical three-four.  Marland Kitchen CARDIAC CATHETERIZATION N/A 01/01/2015   Procedure: Left Heart Cath and Coronary Angiography;  Surgeon: [name], MD;  Location: [place] CV LAB;  Service: Cardiovascular;  Laterality: N/A;  . CARDIAC CATHETERIZATION N/A 04/05/2016   Procedure: Left Heart Cath  and Coronary Angiography;  Surgeon: Belva Crome, MD;  Location: [place] CV LAB;  Service: Cardiovascular;  Laterality: N/A;  . CARDIAC CATHETERIZATION N/A 04/05/2016   Procedure: Coronary Stent  Intervention;  Surgeon: Belva Crome, MD;  Location: [place] CV LAB;  Service: Cardiovascular;  Laterality: N/A;  . CARDIAC CATHETERIZATION N/A 04/05/2016   Procedure: Intravascular Ultrasound/IVUS;  Surgeon: Belva Crome, MD;  Location: [place] CV LAB;  Service: Cardiovascular;  Laterality: N/A;  . NASAL SINUS SURGERY     2005    Prior to Admission medications   Medication Sig Start Date End Date Taking? Authorizing Provider  aspirin EC 81 MG EC tablet Take 1 tablet (81 mg total) by mouth daily. 04/03/17   Clapacs, Madie Reno, MD  atorvastatin (LIPITOR) 80 MG tablet Take 1 tablet (80 mg total) by mouth daily at 6 PM. Patient not taking: Reported on 07/08/2017 04/02/17   Clapacs, Madie Reno, MD  atorvastatin (LIPITOR) 80 MG tablet Take 1 tablet (80 mg total) by mouth daily at 6 PM. Patient taking differently: Take 80 mg by mouth daily.  04/16/17   Clapacs, Madie Reno, MD  carvedilol (COREG) 3.125 MG tablet Take 6.25 mg by mouth 2 (two) times daily with a meal. 06/28/17   [provider]  carvedilol (COREG) 6.25 MG tablet Take 1 tablet (6.25 mg total) by mouth 2 (two) times daily with a meal. Patient not taking: Reported on 07/08/2017 04/16/17   Clapacs, Madie Reno, MD  clonazePAM (KLONOPIN) 0.5 MG tablet Take 0.5 mg by mouth 3 (three) times daily. 07/04/17   [provider]  clopidogrel (PLAVIX) 75 MG tablet Take 1 tablet (75 mg total) by mouth daily. 04/17/17   Clapacs, Madie Reno, MD  escitalopram (LEXAPRO) 20 MG tablet Take 1 tablet (20 mg total) by mouth daily. 04/17/17   Clapacs, Madie Reno, MD  hydrOXYzine (ATARAX/VISTARIL) 50 MG tablet Take 1 tablet (50 mg total) by mouth 3 (three) times daily as needed for anxiety. 04/16/17   Clapacs, Madie Reno, MD  lithium carbonate (ESKALITH) 450 MG CR tablet Take 450 mg by mouth 2 (two) times daily. 07/05/17   [provider]  lurasidone (LATUDA) 80 MG TABS tablet Take 80 mg by mouth daily.    [provider]  Lurasidone HCl 60 MG TABS Take 1  tablet (60 mg total) by mouth daily with breakfast. Patient not taking: Reported on 07/08/2017 04/16/17   Clapacs, Madie Reno, MD  metFORMIN (GLUCOPHAGE) 500 MG tablet Take 1 tablet (500 mg total) by mouth 2 (two) times daily with a meal. 04/16/17   Clapacs, Madie Reno, MD  mirtazapine (REMERON) 45 MG tablet Take 1 tablet (45 mg total) by mouth at bedtime. 04/16/17   Clapacs, Madie Reno, MD  nitroGLYCERIN (NITROSTAT) 0.4 MG SL tablet Place 1 tablet (0.4 mg total) under the tongue every 5 (five) minutes x 3 doses as needed. 09/14/16 09/14/17  Hildred Priest, MD  OLANZapine (ZYPREXA) 20 MG tablet Take 20 mg by mouth at bedtime. 07/05/17   [provider]  OLANZapine (ZYPREXA) 5 MG tablet Take 5 mg by mouth daily. 06/07/17   [provider]  PARoxetine (PAXIL) 20 MG tablet Take 20 mg by mouth daily. 05/04/17   [provider]  prazosin (MINIPRESS) 2 MG capsule Take 1 capsule (2 mg total) by mouth at bedtime. 04/16/17   Clapacs, Madie Reno, MD  QUEtiapine (SEROQUEL) 300 MG tablet Take 1 tablet (300 mg total) by mouth at bedtime. 04/16/17  Clapacs, Madie Reno, MD  traZODone (DESYREL) 100 MG tablet Take 1 tablet (100 mg total) by mouth at bedtime as needed for sleep. 04/16/17   Clapacs, Madie Reno, MD  venlafaxine XR (EFFEXOR-XR) 75 MG 24 hr capsule Take 75 mg by mouth daily. 06/09/17   [provider]  zolpidem (AMBIEN) 10 MG tablet Take 1 tablet (10 mg total) by mouth at bedtime. 04/16/17   Clapacs, Madie Reno, MD    Allergies Prednisone; Hydrocodone; and Varenicline  Family History  Problem Relation Age of Onset  . Prostate cancer Father   . Hypertension Mother   . Kidney Stones Mother   . Anxiety disorder Mother   . Depression Mother   . COPD Sister   . Hypertension Sister   . Diabetes Sister   . Depression Sister   . Anxiety disorder Sister   . Seizures Sister   . ADD / ADHD Son   . ADD / ADHD Daughter     Social History Social History   Tobacco Use  . Smoking status:  Current Every Day Smoker    Packs/day: 2.00    Years: 17.00    Pack years: 34.00    Types: Cigarettes, Cigars  . Smokeless tobacco: Former Systems developer  . Tobacco comment: occassional snuff   Substance Use Topics  . Alcohol use: No    Alcohol/week: 0.0 oz    Comment: last drinked in 3 months.   . Drug use: No    Review of Systems  Constitutional: No fever. Eyes: No visual changes. ENT: No sore throat. Cardiovascular: Denies chest pain. Respiratory: Denies shortness of breath. Gastrointestinal: No vomiting. Genitourinary: Negative for dysuria.  Musculoskeletal: Negative for back pain. Skin: Negative for rash. Neurological: Negative for headache.   ____________________________________________   PHYSICAL EXAM:  VITAL SIGNS: ED Triage Vitals  Enc Vitals Group     BP 10/26/17 0746 (!) 151/88     Pulse Rate 10/26/17 0746 89     Resp 10/26/17 0746 16     Temp 10/26/17 0746 98.1 F (36.7 C)     Temp Source 10/26/17 0746 Oral     SpO2 10/26/17 0746 97 %     Weight 10/26/17 0747 220 lb (99.8 kg)     Height 10/26/17 0747 6\' 2"  (1.88 m)     Head Circumference --      Peak Flow --      Pain Score 10/26/17 0746 7     Pain Loc --      Pain Edu? --      Excl. in [place]? --     Constitutional: Alert and oriented. Well appearing and in no acute distress. Eyes: Conjunctivae are normal.  Head: Atraumatic. Nose: No congestion/rhinnorhea. Mouth/Throat: Mucous membranes are moist.   Neck: Normal range of motion.  Cardiovascular:  Good peripheral circulation. Respiratory: Normal respiratory effort.   Gastrointestinal: No distention.  Musculoskeletal: Extremities warm and well perfused.  Neurologic:  Normal speech and language. No gross focal neurologic deficits are appreciated.  Skin:  Skin is warm and dry. No rash noted. Psychiatric: Speech and behavior are normal.  ____________________________________________   LABS (all labs ordered are listed, but only abnormal results are  displayed)  Labs Reviewed  COMPREHENSIVE METABOLIC PANEL - Abnormal; Notable for the following components:      Result Value   Glucose, Bld 190 (*)    All other components within normal limits  ACETAMINOPHEN LEVEL - Abnormal; Notable for the following components:   Acetaminophen (Tylenol), Serum <10 (*)  All other components within normal limits  CBC - Abnormal; Notable for the following components:   WBC 11.6 (*)    RBC 4.30 (*)    HCT 39.4 (*)    RDW 14.7 (*)    All other components within normal limits  URINE DRUG SCREEN, QUALITATIVE ([facility] ONLY) - Abnormal; Notable for the following components:   Tricyclic, Ur Screen POSITIVE (*)    Benzodiazepine, Ur Scrn TEST NOT PERFORMED, REAGENT NOT AVAILABLE (*)    All other components within normal limits  ETHANOL  SALICYLATE LEVEL   ____________________________________________  EKG   ____________________________________________  RADIOLOGY    ____________________________________________   PROCEDURES  Procedure(s) performed: No  Procedures  Critical Care performed: No ____________________________________________   INITIAL IMPRESSION / ASSESSMENT AND PLAN / ED COURSE  Pertinent labs & imaging results that were available during my care of the patient were reviewed by me and considered in my medical decision making (see chart for details).  47 year old male with PMH as noted above presents with suicidal ideation, with no active current plan.  Patient states he primarily wants to be referred to a long-term treatment facility.  Denies any other acute complaints.  I reviewed the past medical records in Epic; the patient has had multiple prior ED visits and evaluations for suicidal ideation.  On exam, the patient is comfortable appearing.  Vital signs are normal.  Patient is voluntary at this time.  We will obtain labs for medical clearance, psychiatry consult and reassess.  ----------------------------------------- 2:24  PM on 10/26/2017 -----------------------------------------  The patient was transferred to the [facility].  He has been evaluated by [name], and he is now cleared for discharge.  The IVC has been rescinded.  Patient stable for discharge at this time. ____________________________________________   FINAL CLINICAL IMPRESSION(S) / ED DIAGNOSES  Final diagnoses:  Suicidal ideation      NEW MEDICATIONS STARTED DURING THIS VISIT:  New Prescriptions   No medications on file     Note:  This document was prepared using Dragon voice recognition software and may include unintentional dictation errors.    Arta Silence, MD 10/26/17 1424

## 2017-10-26 NOTE — ED Notes (Signed)
Pt had a visit from his pastor Michael Schmitt [phone]  Mr. Michael Schmitt brought paperwork (given to TTS) in hopes of the patient being referred to [facility].

## 2017-10-26 NOTE — ED Notes (Addendum)
Pt with flat affect, stating he feels he needs long term treatment. Pt stated he has 2 younger children that need him and "I can't be there for them like this."  Pt endorses suicidal thoughts. No plan.  Calm and cooperative at this time. Maintained on 15 minute checks and observation by security camera for safety.

## 2017-10-26 NOTE — ED Notes (Signed)
The patient was dressed out into the required purple scrubs. His cloths were placed into a white belongings bag and labeled properly. One pair grey shoes, one pair blue jeans, one pair plaid underwear, one yellow shirt. His preacher came with him and remained in the room during the dress out period along with a paramedic student. The patient gave his phone to the preacher to take with him. His cloths were given to the quad RN. He was taken to [facility] without any issues.

## 2017-10-26 NOTE — ED Notes (Signed)
IVC/Consult ordered/ Moved to [employer]

## 2017-10-26 NOTE — ED Notes (Signed)
Pt states that he is suicidal, denies having a plan.  Contracts for safety.  Denies HI, A/V hallucinations.  Pt states, "I'm here for a referral to that hospital in [place].  I need long term care.  I'm tired of coming to the hospital and getting discharged."

## 2017-10-26 NOTE — Discharge Instructions (Addendum)
Follow-up with your regular psychiatrist, and continue to take your normal medications.  Return to the ER for any new or worsening thoughts of wanting to hurt yourself or anyone else, or any other worsening symptoms that concern you.
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
## 2017-10-29 IMAGING — CR DG CHEST 2V
2 series · 2 of 2 positions shown · non-contrast
Comparison: 07/31/2015.

CLINICAL DATA: Chest pain.

EXAM:
CHEST  2 VIEW

[chest pa]
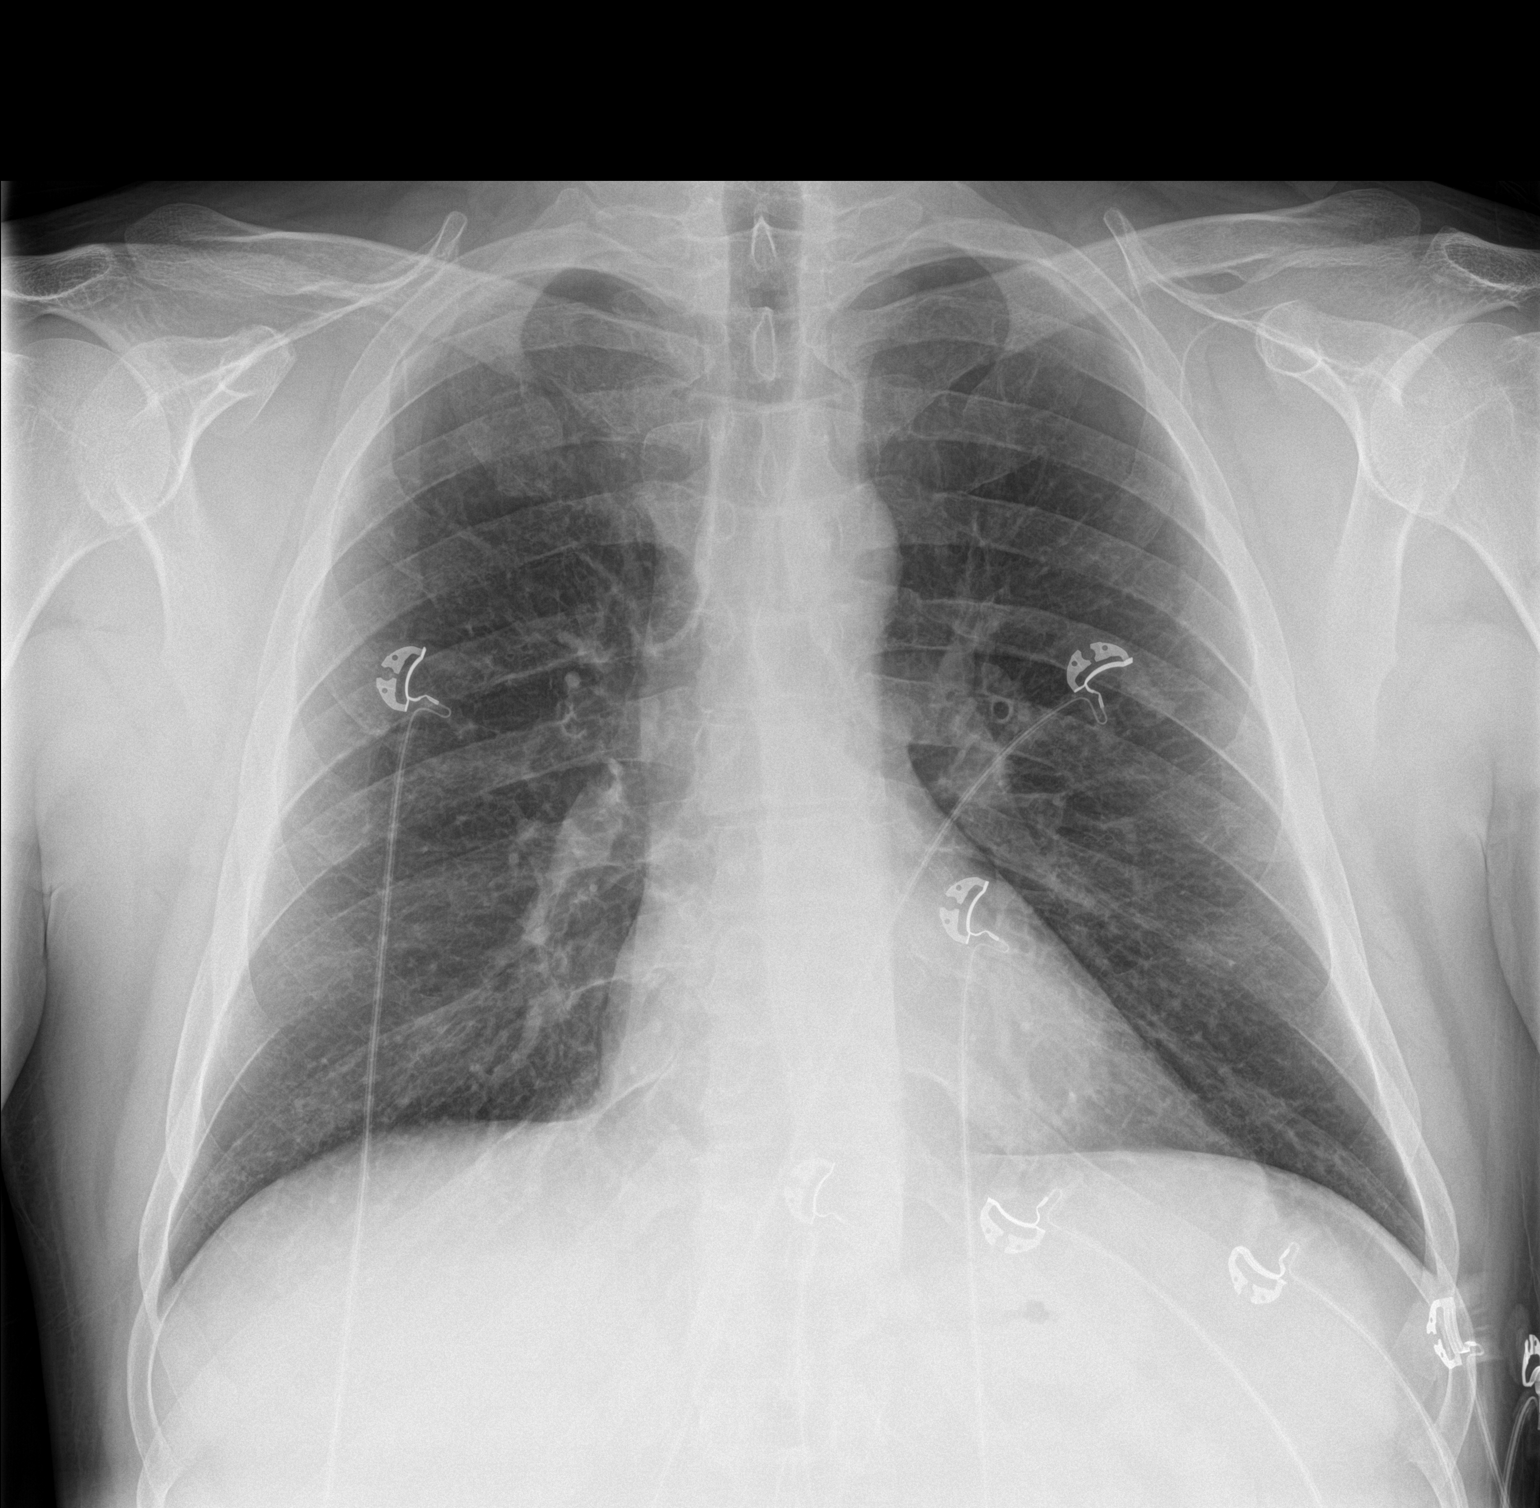

[chest lat]
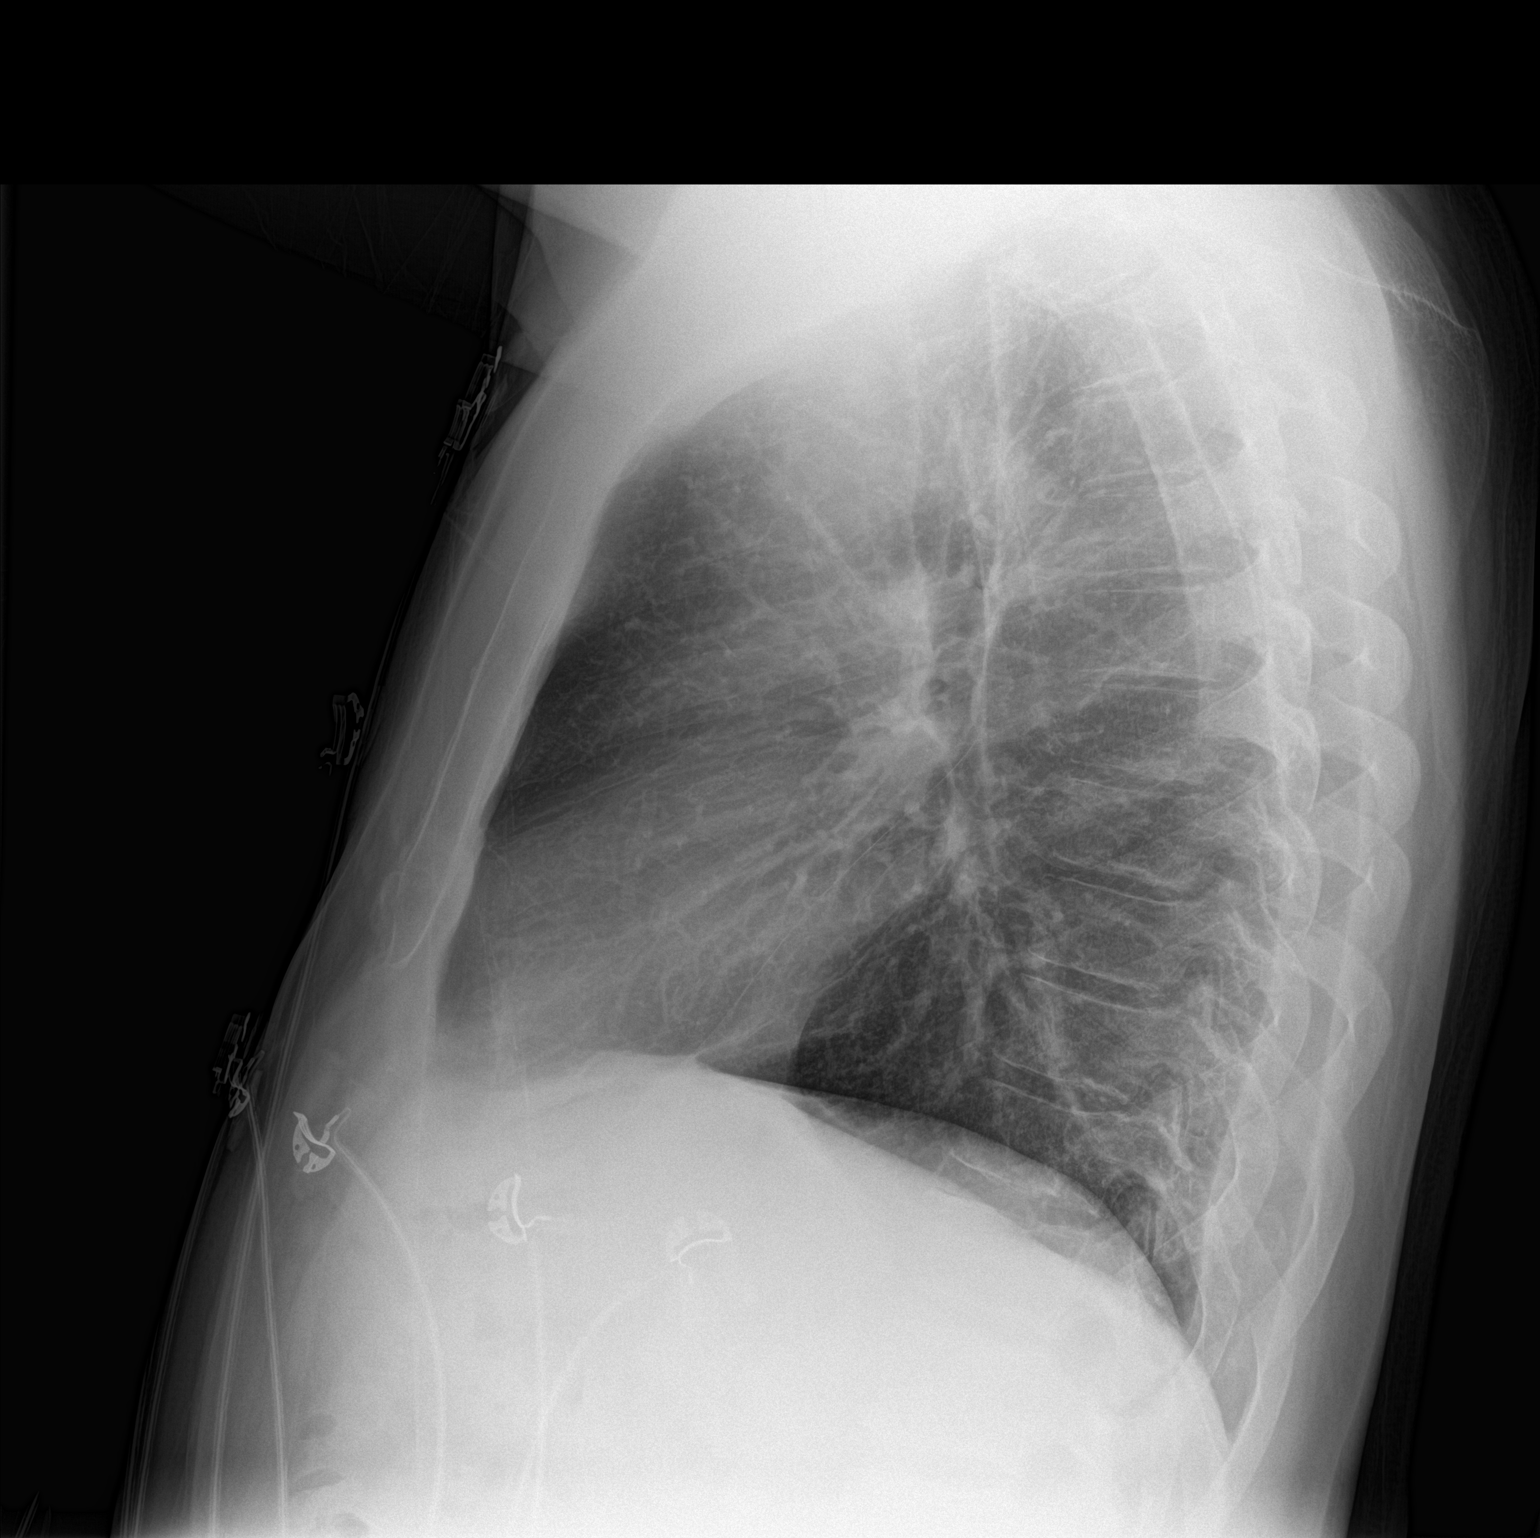

[2 of 2 positions shown; findings below may reference images not displayed]

FINDINGS: Mediastinum and hilar structures are normal. Lungs are clear. No
pleural effusion or pneumothorax. No acute bony abnormality.
IMPRESSION: No acute cardiopulmonary disease.

## 2017-10-31 ENCOUNTER — Other Ambulatory Visit: Payer: Self-pay

## 2017-10-31 NOTE — Patient Outreach (Signed)
[facility]) Care Management  10/31/2017  Michael Schmitt [birth year] [id number]   Medication Adherence call to Mr. Michael Schmitt patient's telephone number is disconnected. patient is due on Atorvastatin 80 mg and Metformin 500 mg. Michael Schmitt is showing past due under [place].   [place] Management Direct Dial [phone]  Fax [phone] [email or web address]

## 2017-11-16 ENCOUNTER — Emergency Department
Admission: EM | Admit: 2017-11-16 | Discharge: 2017-11-17 | Disposition: A | Discharge status: Home / Self Care | Payer: Medicare Other | Attending: Emergency Medicine | Admitting: Emergency Medicine

## 2017-11-16 ENCOUNTER — Other Ambulatory Visit: Payer: Self-pay

## 2017-11-16 DIAGNOSIS — F329 Major depressive disorder, single episode, unspecified: Secondary | ICD-10-CM | POA: Insufficient documentation

## 2017-11-16 DIAGNOSIS — R456 Violent behavior: Secondary | ICD-10-CM | POA: Insufficient documentation

## 2017-11-16 DIAGNOSIS — I2510 Atherosclerotic heart disease of native coronary artery without angina pectoris: Secondary | ICD-10-CM | POA: Insufficient documentation

## 2017-11-16 DIAGNOSIS — Z79899 Other long term (current) drug therapy: Secondary | ICD-10-CM | POA: Insufficient documentation

## 2017-11-16 DIAGNOSIS — Z7984 Long term (current) use of oral hypoglycemic drugs: Secondary | ICD-10-CM | POA: Diagnosis not present

## 2017-11-16 DIAGNOSIS — Z7902 Long term (current) use of antithrombotics/antiplatelets: Secondary | ICD-10-CM | POA: Insufficient documentation

## 2017-11-16 DIAGNOSIS — N189 Chronic kidney disease, unspecified: Secondary | ICD-10-CM | POA: Insufficient documentation

## 2017-11-16 DIAGNOSIS — I459 Conduction disorder, unspecified: Secondary | ICD-10-CM | POA: Diagnosis present

## 2017-11-16 DIAGNOSIS — Z7982 Long term (current) use of aspirin: Secondary | ICD-10-CM | POA: Insufficient documentation

## 2017-11-16 DIAGNOSIS — E119 Type 2 diabetes mellitus without complications: Secondary | ICD-10-CM | POA: Diagnosis not present

## 2017-11-16 DIAGNOSIS — F17210 Nicotine dependence, cigarettes, uncomplicated: Secondary | ICD-10-CM | POA: Insufficient documentation

## 2017-11-16 DIAGNOSIS — Z765 Malingerer [conscious simulation]: Secondary | ICD-10-CM | POA: Diagnosis not present

## 2017-11-16 DIAGNOSIS — R45851 Suicidal ideations: Secondary | ICD-10-CM | POA: Insufficient documentation

## 2017-11-16 DIAGNOSIS — I129 Hypertensive chronic kidney disease with stage 1 through stage 4 chronic kidney disease, or unspecified chronic kidney disease: Secondary | ICD-10-CM | POA: Insufficient documentation

## 2017-11-16 DIAGNOSIS — F32A Depression, unspecified: Secondary | ICD-10-CM

## 2017-11-16 LAB — COMPREHENSIVE METABOLIC PANEL
ALT: 31 U/L (ref 0–44)
AST: 35 U/L (ref 15–41)
Albumin: 4.4 g/dL (ref 3.5–5.0)
Alkaline Phosphatase: 106 U/L (ref 38–126)
Anion gap: 8 (ref 5–15)
BUN: 7 mg/dL (ref 6–20)
CHLORIDE: 101 mmol/L (ref 98–111)
CO2: 26 mmol/L (ref 22–32)
CREATININE: 0.78 mg/dL (ref 0.61–1.24)
Calcium: 9.5 mg/dL (ref 8.9–10.3)
GFR calc non Af Amer: >60 mL/min (ref >60)
Glucose, Bld: 136 mg/dL — ABNORMAL HIGH (ref 70–99)
POTASSIUM: 3.8 mmol/L (ref 3.5–5.1)
SODIUM: 135 mmol/L (ref 135–145)
Total Bilirubin: 0.5 mg/dL (ref 0.3–1.2)
Total Protein: 7.6 g/dL (ref 6.5–8.1)

## 2017-11-16 LAB — URINE DRUG SCREEN, QUALITATIVE (ARMC ONLY)
AMPHETAMINES, UR SCREEN: NOT DETECTED
Barbiturates, Ur Screen: NOT DETECTED
Cannabinoid 50 Ng, Ur ~~LOC~~: NOT DETECTED
Cocaine Metabolite,Ur ~~LOC~~: NOT DETECTED
MDMA (ECSTASY) UR SCREEN: NOT DETECTED
METHADONE SCREEN, URINE: NOT DETECTED
Opiate, Ur Screen: NOT DETECTED
Phencyclidine (PCP) Ur S: NOT DETECTED
TRICYCLIC, UR SCREEN: POSITIVE — AB

## 2017-11-16 LAB — CBC
HCT: 40.4 % (ref 40.0–52.0)
HEMOGLOBIN: 13.7 g/dL (ref 13.0–18.0)
MCH: 30.4 pg (ref 26.0–34.0)
MCHC: 33.9 g/dL (ref 32.0–36.0)
MCV: 89.7 fL (ref 80.0–100.0)
Platelets: 208 10*3/uL (ref 150–440)
RBC: 4.5 MIL/uL (ref 4.40–5.90)
RDW: 14.2 % (ref 11.5–14.5)
WBC: 8.2 10*3/uL (ref 3.8–10.6)

## 2017-11-16 LAB — ACETAMINOPHEN LEVEL

## 2017-11-16 LAB — SALICYLATE LEVEL

## 2017-11-16 LAB — ETHANOL: Alcohol, Ethyl (B): <10 mg/dL (ref <10)

## 2017-11-16 MED ORDER — LORAZEPAM 2 MG PO TABS
2.0000 mg | ORAL_TABLET | Freq: Once | ORAL | Status: AC
  Administered 2017-11-16: 2 mg via ORAL
  Filled 2017-11-16: qty 1

## 2017-11-16 MED ORDER — ZIPRASIDONE MESYLATE 20 MG IM SOLR
20.0000 mg | Freq: Once | INTRAMUSCULAR | Status: AC
  Administered 2017-11-16: 20 mg via INTRAMUSCULAR
  Filled 2017-11-16: qty 20

## 2017-11-16 NOTE — ED Notes (Signed)
Pt up using restroom with no assistance from ER staff

## 2017-11-16 NOTE — ED Notes (Signed)
Pt. Woke up from bed and used bathroom, pt. Returned to room with steady gait.
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
## 2017-11-16 NOTE — ED Provider Notes (Signed)
[facility] Emergency Department Provider Note  ____________________________________________   First MD Initiated Contact with Patient 11/16/17 1821     (approximate)  I have reviewed the triage vital signs and the nursing notes.   HISTORY  Chief Complaint Suicidal  Level 5 caveat:  history/ROS limited by active psychosis / mental illness / altered mental status   HPI Michael Schmitt is a 47 y.o. male who is well-known to the emergency department for psychiatric issues who presents under involuntary commitment for suicidal ideation with reportedly a plan to kill himself by slitting his wrist.  He is very agitated in the exam room and is punching himself in the head and screaming that he needs help.  He is begging for medication; he says he wants to die and needs something to calm him down.  He is not threatening anyone else but says that he will kill himself if given the chance.  He denies chest pain, shortness of breath, nausea, vomiting, and abdominal pain.  No additional history is available at this time given his active and acute mental illness or inability or unwillingness to cooperate.   Past Medical History:  Diagnosis Date  . Anxiety   . Anxiety   . Arthritis    cerv. stenosis, spondylosis, HNP- lower back , has been followed in pain clinic, has  had injection s in cerv. area  . Blood dyscrasia    told that when he was young he was a" free bleeder"  . CAD (coronary artery disease)   . Cervical spondylosis without myelopathy 07/24/2014  . Cervicogenic headache 07/24/2014  . Chronic kidney disease    renal calculi- passed spontaneously  . Depression   . Diabetes mellitus without complication ([place])   . Fatty liver   . GERD (gastroesophageal reflux disease)   . Headache(784.0)   . Hyperlipidemia   . Hypertension   . Mental disorder   . MI, old   . RLS (restless legs syndrome)    detected on sleep study  . Shortness of breath   .  Ventricular fibrillation ([place]) 04/05/2016    Patient Active Problem List   Diagnosis Date Noted  . Dysthymia 10/26/2017  . Severe recurrent major depression without psychotic features ([place]) 03/12/2017  . Generalized anxiety disorder 02/05/2017  . Chronic neck pain (Primary Area of Pain) (Left) 11/22/2016  . Cervical facet syndrome (Left) 11/22/2016  . Cervical radiculitis (Left) 11/22/2016  . History of cervical spinal surgery 11/22/2016  . Cervical spondylosis 11/22/2016  . Chronic shoulder pain (Secondary Area of Pain) (Left) 11/22/2016  . Arthropathy of shoulder (Left) 11/22/2016  . Chronic low back pain [facility] Area of Pain) (Bilateral) (L>R) 11/22/2016  . Lumbar facet syndrome (Bilateral) (L>R) 11/22/2016  . Chronic pain syndrome 11/21/2016  . Long term prescription benzodiazepine use 11/21/2016  . Opiate use 11/21/2016  . Uncontrolled type 2 diabetes mellitus without complication, without long-term current use of insulin ([place]) 10/19/2016  . Chronic Suicidal ideation 09/15/2016  . Coronary artery disease 09/12/2016  . HTN (hypertension) 06/26/2016  . GERD (gastroesophageal reflux disease) 06/26/2016  . Alcohol use disorder, severe, 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[place]) 06/26/2016  . Moderate benzodiazepine use disorder ([place]) 06/11/2016  . Sedative, hypnotic or anxiolytic use disorder, severe, dependence ([place]) 05/12/2016  . Ventricular fibrillation ([place]) 04/05/2016  . Combined hyperlipidemia 01/05/2016  . Cervical spondylosis without myelopathy 07/24/2014  . Severe episode of recurrent major depressive disorder, without psychotic features ([place]) 04/15/2013  . Tobacco use disorder 10/20/2010  .  Asthma 10/20/2010    Past Surgical History:  Procedure Laterality Date  . ANTERIOR CERVICAL DECOMP/DISCECTOMY FUSION  11/13/2011   Procedure: ANTERIOR CERVICAL DECOMPRESSION/DISCECTOMY FUSION 1 LEVEL;  Surgeon: [name], MD;  Location: [place] NEURO ORS;  Service: Neurosurgery;  Laterality: N/A;   Anterior Cervical Decompression/discectomy Fusion. Cervical three-four.  Marland Kitchen CARDIAC CATHETERIZATION N/A 01/01/2015   Procedure: Left Heart Cath and Coronary Angiography;  Surgeon: [name], MD;  Location: [place] CV LAB;  Service: Cardiovascular;  Laterality: N/A;  . CARDIAC CATHETERIZATION N/A 04/05/2016   Procedure: Left Heart Cath and Coronary Angiography;  Surgeon: Belva Crome, MD;  Location: [place] CV LAB;  Service: Cardiovascular;  Laterality: N/A;  . CARDIAC CATHETERIZATION N/A 04/05/2016   Procedure: Coronary Stent Intervention;  Surgeon: Belva Crome, MD;  Location: [place] CV LAB;  Service: Cardiovascular;  Laterality: N/A;  . CARDIAC CATHETERIZATION N/A 04/05/2016   Procedure: Intravascular Ultrasound/IVUS;  Surgeon: Belva Crome, MD;  Location: [place] CV LAB;  Service: Cardiovascular;  Laterality: N/A;  . NASAL SINUS SURGERY     2005    Prior to Admission medications   Medication Sig Start Date End Date Taking? Authorizing Provider  aspirin EC 81 MG EC tablet Take 1 tablet (81 mg total) by mouth daily. 04/03/17   Clapacs, Madie Reno, MD  atorvastatin (LIPITOR) 80 MG tablet Take 1 tablet (80 mg total) by mouth daily at 6 PM. Patient not taking: Reported on 07/08/2017 04/02/17   Clapacs, Madie Reno, MD  atorvastatin (LIPITOR) 80 MG tablet Take 1 tablet (80 mg total) by mouth daily at 6 PM. Patient taking differently: Take 80 mg by mouth daily.  04/16/17   Clapacs, Madie Reno, MD  carvedilol (COREG) 3.125 MG tablet Take 6.25 mg by mouth 2 (two) times daily with a meal. 06/28/17   [provider]  carvedilol (COREG) 6.25 MG tablet Take 1 tablet (6.25 mg total) by mouth 2 (two) times daily with a meal. Patient not taking: Reported on 07/08/2017 04/16/17   Clapacs, Madie Reno, MD  clonazePAM (KLONOPIN) 0.5 MG tablet Take 0.5 mg by mouth 3 (three) times daily. 07/04/17   [provider]  clopidogrel (PLAVIX) 75 MG tablet Take 1 tablet (75 mg total) by mouth daily. 04/17/17    Clapacs, Madie Reno, MD  escitalopram (LEXAPRO) 20 MG tablet Take 1 tablet (20 mg total) by mouth daily. 04/17/17   Clapacs, Madie Reno, MD  hydrOXYzine (ATARAX/VISTARIL) 50 MG tablet Take 1 tablet (50 mg total) by mouth 3 (three) times daily as needed for anxiety. 04/16/17   Clapacs, Madie Reno, MD  lithium carbonate (ESKALITH) 450 MG CR tablet Take 450 mg by mouth 2 (two) times daily. 07/05/17   [provider]  lurasidone (LATUDA) 80 MG TABS tablet Take 80 mg by mouth daily.    [provider]  Lurasidone HCl 60 MG TABS Take 1 tablet (60 mg total) by mouth daily with breakfast. Patient not taking: Reported on 07/08/2017 04/16/17   Clapacs, Madie Reno, MD  metFORMIN (GLUCOPHAGE) 500 MG tablet Take 1 tablet (500 mg total) by mouth 2 (two) times daily with a meal. 04/16/17   Clapacs, Madie Reno, MD  mirtazapine (REMERON) 45 MG tablet Take 1 tablet (45 mg total) by mouth at bedtime. 04/16/17   Clapacs, Madie Reno, MD  nitroGLYCERIN (NITROSTAT) 0.4 MG SL tablet Place 1 tablet (0.4 mg total) under the tongue every 5 (five) minutes x 3 doses as needed. 09/14/16 09/14/17  Hernandez-Gonzalez,  Seth Bake, MD  OLANZapine (ZYPREXA) 20 MG tablet Take 20 mg by mouth at bedtime. 07/05/17   [provider]  OLANZapine (ZYPREXA) 5 MG tablet Take 5 mg by mouth daily. 06/07/17   [provider]  PARoxetine (PAXIL) 20 MG tablet Take 20 mg by mouth daily. 05/04/17   [provider]  prazosin (MINIPRESS) 2 MG capsule Take 1 capsule (2 mg total) by mouth at bedtime. 04/16/17   Clapacs, Madie Reno, MD  QUEtiapine (SEROQUEL) 300 MG tablet Take 1 tablet (300 mg total) by mouth at bedtime. 04/16/17   Clapacs, Madie Reno, MD  traZODone (DESYREL) 100 MG tablet Take 1 tablet (100 mg total) by mouth at bedtime as needed for sleep. 04/16/17   Clapacs, Madie Reno, MD  venlafaxine XR (EFFEXOR-XR) 75 MG 24 hr capsule Take 75 mg by mouth daily. 06/09/17   [provider]  zolpidem (AMBIEN) 10 MG tablet Take 1 tablet (10 mg total) by  mouth at bedtime. 04/16/17   Clapacs, Madie Reno, MD    Allergies Prednisone; Hydrocodone; and Varenicline  Family History  Problem Relation Age of Onset  . Prostate cancer Father   . Hypertension Mother   . Kidney Stones Mother   . Anxiety disorder Mother   . Depression Mother   . COPD Sister   . Hypertension Sister   . Diabetes Sister   . Depression Sister   . Anxiety disorder Sister   . Seizures Sister   . ADD / ADHD Son   . ADD / ADHD Daughter     Social History Social History   Tobacco Use  . Smoking status: Current Every Day Smoker    Packs/day: 2.00    Years: 17.00    Pack years: 34.00    Types: Cigarettes, Cigars  . Smokeless tobacco: Former Systems developer  . Tobacco comment: occassional snuff   Substance Use Topics  . Alcohol use: No    Alcohol/week: 0.0 standard drinks    Comment: last drinked in 3 months.   . Drug use: No    Review of Systems  Level 5 caveat:  history/ROS limited by active psychosis / mental illness / altered mental status  ____________________________________________   PHYSICAL EXAM:  VITAL SIGNS: ED Triage Vitals  Enc Vitals Group     BP 11/16/17 1740 (!) 142/74     Pulse Rate 11/16/17 1740 74     Resp 11/16/17 1740 16     Temp 11/16/17 1740 98.3 F (36.8 C)     Temp Source 11/16/17 1740 Oral     SpO2 11/16/17 1740 98 %     Weight 11/16/17 1741 98.4 kg (217 lb)     Height 11/16/17 1741 1.88 m (6\' 2" )     Head Circumference --      Peak Flow --      Pain Score 11/16/17 1741 0     Pain Loc --      Pain Edu? --      Excl. in [place]? --     Constitutional: Alert, agitated, screaming, saying he wants to kill himself Eyes: Conjunctivae are normal.  Head: Atraumatic. Nose: No congestion/rhinnorhea. Mouth/Throat: Mucous membranes are moist. Neck: No stridor.  No meningeal signs.   Cardiovascular: Normal rate, regular rhythm. Good peripheral circulation. Grossly normal heart sounds. Respiratory: Normal respiratory effort but increased  respiratory rate.  No retractions. Lungs CTAB. Gastrointestinal: Soft and nontender. No distention.  Musculoskeletal: No lower extremity tenderness nor edema. No gross deformities of extremities. Neurologic:  Normal speech and language. No gross focal neurologic deficits are appreciated.  Skin:  Skin is warm, dry and intact. No rash noted. Psychiatric: Agitated and upset, screaming, endorses suicidal ideation  ____________________________________________   LABS (all labs ordered are listed, but only abnormal results are displayed)  Labs Reviewed  COMPREHENSIVE METABOLIC PANEL - Abnormal; Notable for the following components:      Result Value   Glucose, Bld 136 (*)    All other components within normal limits  ACETAMINOPHEN LEVEL - Abnormal; Notable for the following components:   Acetaminophen (Tylenol), Serum <10 (*)    All other components within normal limits  URINE DRUG SCREEN, QUALITATIVE ([facility] ONLY) - Abnormal; Notable for the following components:   Tricyclic, Ur Screen POSITIVE (*)    Benzodiazepine, Ur Scrn TEST NOT PERFORMED, REAGENT NOT AVAILABLE (*)    All other components within normal limits  ETHANOL  SALICYLATE LEVEL  CBC   ____________________________________________  EKG  None - EKG not ordered by ED physician ____________________________________________  RADIOLOGY   ED MD interpretation: No indication for imaging  Official radiology report(s): No results found.  ____________________________________________   PROCEDURES  Critical Care performed: No   Procedure(s) performed:   Procedures   ____________________________________________   INITIAL IMPRESSION / ASSESSMENT AND PLAN / ED COURSE  As part of my medical decision making, I reviewed the following data within the [place] notes reviewed and incorporated, Labs reviewed , Old chart reviewed and Notes from prior ED visits    The patient is well-known to this  emergency department but is particularly agitated tonight.  Differential includes depression, adjustment disorder, mood disorder, malingering, drug-seeking behavior.  Given the degree of impairment he is currently experiencing and the fact that he is punching himself in the head and screaming in spite of Ativan 2 mg by mouth, I have ordered Geodon 20 mg intramuscular.  He will require psychiatric evaluation after he has the opportunity to calm down.  No indication of an acute medical issue at this time.  Lab results are essentially within normal limits, no acute abnormalities on metabolic panel, CBC, ethanol, salicylate, acetaminophen, and urine drug screen is only positive for tricyclics.     ____________________________________________  FINAL CLINICAL IMPRESSION(S) / ED DIAGNOSES  Final diagnoses:  Depression, unspecified depression type  Suicidal ideation  Violent behavior     MEDICATIONS GIVEN DURING THIS VISIT:  Medications  LORazepam (ATIVAN) tablet 2 mg (2 mg Oral Given 11/16/17 1822)  ziprasidone (GEODON) injection 20 mg (20 mg Intramuscular Given 11/16/17 1844)     ED Discharge Orders    None       Note:  This document was prepared using Dragon voice recognition software and may include unintentional dictation errors.    Hinda Kehr, MD 11/16/17 [phone]

## 2017-11-16 NOTE — ED Triage Notes (Addendum)
Pt brought in by [facility] Department under IVC. Pt states that wants some help for suicidal thoughts. Denies any recent SI attempts. Pt states he would take a knife and cut his wrists, does have access to knife. Pt states "I want to go to [place]"  Pt alert and oriented X4, active, cooperative, pt in NAD. RR even and unlabored, color WNL.    Arrives in bilateral wrist cuffs and ankle cuffs, no signs of redness. Sensation intact.  Removed by officers now.

## 2017-11-16 NOTE — ED Notes (Signed)
Photo ID, sunglasses, 2 books, brown shoes, white socks, striped shirt, red banks, white underwear, black reading glasses, debit card and paperwork placed in patient belonging bag. Pt was brought from jail.

## 2017-11-17 MED ORDER — LORAZEPAM 2 MG/ML IJ SOLN
2.0000 mg | Freq: Once | INTRAMUSCULAR | Status: AC
  Administered 2017-11-17: 2 mg via INTRAMUSCULAR
  Filled 2017-11-17: qty 1

## 2017-11-17 MED ORDER — DIPHENHYDRAMINE HCL 25 MG PO CAPS
50.0000 mg | ORAL_CAPSULE | Freq: Once | ORAL | Status: AC
  Administered 2017-11-17: 50 mg via ORAL
  Filled 2017-11-17: qty 2

## 2017-11-17 NOTE — ED Provider Notes (Signed)
-----------------------------------------   7:24 AM on 11/17/2017 -----------------------------------------   Blood pressure (!) 142/74, pulse 74, temperature 98.3 F (36.8 C), temperature source Oral, resp. rate 16, height 6\' 2"  (1.88 m), weight 98.4 kg, SpO2 98 %.  The patient had no acute events since last update.  Calm and cooperative at this time.  Disposition is pending Psychiatry/Behavioral Medicine team recommendations.     Schuyler Amor, MD 11/17/17 [phone]

## 2017-11-17 NOTE — ED Provider Notes (Addendum)
-----------------------------------------   1:16 PM on 11/17/2017 -----------------------------------------  Patient talked to psychiatry who talk to me, psychiatry reveal the patient states that he, the patient, inadvertently revealed that he was faking all of his symptoms because he wanted to get out of jail and he wanted Ativan.  Obviously were not a place for people who would prefer not to be in jail and also were not a place for people who would prefer to have Ativan for no acute medical reason.  For this reason, psychiatry is worsening his IVC and we will discharge him back to wherever it is appropriate for him to go, it sounds like jail  ----------------------------------------- 8:58 AM on 11/17/2017 -----------------------------------------  Yelling on the top of his lungs, when I ask him why he states is because he wants to sleep.  This is an unusual method to get to sleep.  He is requesting somewhat forcefully a "shot" to get him to sleep.  He will be given some Ativan.   Schuyler Amor, MD 11/17/17 2671    Schuyler Amor, MD 11/17/17 [phone]

## 2017-11-17 NOTE — ED Notes (Signed)
Pt IVC and pending psych consult.

## 2017-11-17 NOTE — ED Notes (Signed)
Pt yelling while trying to call for a ride home.   Maintained on 15 minute checks and observation by security camera for safety.

## 2017-11-17 NOTE — ED Notes (Signed)
Called [facility] for consult   [phone]

## 2017-11-17 NOTE — ED Notes (Signed)
Pt discharged to lobby. Patient called a friend for a ride home.  VS stable. Discharge instructions reviewed with patient. Patient signed for discharge. All belongings returned to patient. Denies SI/HI and AVH. Denies pain.

## 2017-11-19 ENCOUNTER — Emergency Department (HOSPITAL_COMMUNITY)
Admission: EM | Admit: 2017-11-19 | Discharge: 2017-11-20 | Disposition: A | Discharge status: Other Acute Inpatient Hospital | Payer: Medicare Other | Attending: Emergency Medicine | Admitting: Emergency Medicine

## 2017-11-19 ENCOUNTER — Other Ambulatory Visit: Payer: Self-pay

## 2017-11-19 ENCOUNTER — Encounter (HOSPITAL_COMMUNITY): Payer: Self-pay | Admitting: Emergency Medicine

## 2017-11-19 DIAGNOSIS — Z7984 Long term (current) use of oral hypoglycemic drugs: Secondary | ICD-10-CM | POA: Diagnosis not present

## 2017-11-19 DIAGNOSIS — Z79899 Other long term (current) drug therapy: Secondary | ICD-10-CM | POA: Insufficient documentation

## 2017-11-19 DIAGNOSIS — E1122 Type 2 diabetes mellitus with diabetic chronic kidney disease: Secondary | ICD-10-CM | POA: Insufficient documentation

## 2017-11-19 DIAGNOSIS — F332 Major depressive disorder, recurrent severe without psychotic features: Secondary | ICD-10-CM | POA: Diagnosis not present

## 2017-11-19 DIAGNOSIS — I129 Hypertensive chronic kidney disease with stage 1 through stage 4 chronic kidney disease, or unspecified chronic kidney disease: Secondary | ICD-10-CM | POA: Diagnosis not present

## 2017-11-19 DIAGNOSIS — R45851 Suicidal ideations: Secondary | ICD-10-CM | POA: Diagnosis not present

## 2017-11-19 DIAGNOSIS — I2510 Atherosclerotic heart disease of native coronary artery without angina pectoris: Secondary | ICD-10-CM | POA: Diagnosis not present

## 2017-11-19 DIAGNOSIS — N189 Chronic kidney disease, unspecified: Secondary | ICD-10-CM | POA: Diagnosis not present

## 2017-11-19 DIAGNOSIS — J45909 Unspecified asthma, uncomplicated: Secondary | ICD-10-CM | POA: Diagnosis not present

## 2017-11-19 DIAGNOSIS — Z7902 Long term (current) use of antithrombotics/antiplatelets: Secondary | ICD-10-CM | POA: Diagnosis not present

## 2017-11-19 LAB — CBC
HEMATOCRIT: 44.4 % (ref 39.0–52.0)
HEMOGLOBIN: 13.9 g/dL (ref 13.0–17.0)
MCH: 29.5 pg (ref 26.0–34.0)
MCHC: 31.3 g/dL (ref 30.0–36.0)
MCV: 94.3 fL (ref 78.0–100.0)
Platelets: 250 10*3/uL (ref 150–400)
RBC: 4.71 MIL/uL (ref 4.22–5.81)
RDW: 13 % (ref 11.5–15.5)
WBC: 6.8 10*3/uL (ref 4.0–10.5)

## 2017-11-19 LAB — COMPREHENSIVE METABOLIC PANEL
ALBUMIN: 3.9 g/dL (ref 3.5–5.0)
ALK PHOS: 110 U/L (ref 38–126)
ALT: 41 U/L (ref 0–44)
ANION GAP: 10 (ref 5–15)
AST: 36 U/L (ref 15–41)
BILIRUBIN TOTAL: 0.4 mg/dL (ref 0.3–1.2)
BUN: <5 mg/dL — ABNORMAL LOW (ref 6–20)
CALCIUM: 9 mg/dL (ref 8.9–10.3)
CO2: 25 mmol/L (ref 22–32)
Chloride: 103 mmol/L (ref 98–111)
Creatinine, Ser: 1.02 mg/dL (ref 0.61–1.24)
GFR calc Af Amer: >60 mL/min (ref >60)
GFR calc non Af Amer: >60 mL/min (ref >60)
GLUCOSE: 163 mg/dL — AB (ref 70–99)
Potassium: 3.8 mmol/L (ref 3.5–5.1)
SODIUM: 138 mmol/L (ref 135–145)
TOTAL PROTEIN: 6.5 g/dL (ref 6.5–8.1)

## 2017-11-19 LAB — RAPID URINE DRUG SCREEN, HOSP PERFORMED
Amphetamines: NOT DETECTED
BARBITURATES: NOT DETECTED
Benzodiazepines: POSITIVE — AB
COCAINE: NOT DETECTED
Opiates: POSITIVE — AB
Tetrahydrocannabinol: NOT DETECTED

## 2017-11-19 LAB — ETHANOL: Alcohol, Ethyl (B): <10 mg/dL (ref <10)

## 2017-11-19 LAB — LITHIUM LEVEL: Lithium Lvl: <0.06 mmol/L — ABNORMAL LOW (ref 0.60–1.20)

## 2017-11-19 LAB — ACETAMINOPHEN LEVEL: ACETAMINOPHEN (TYLENOL), SERUM: 21 ug/mL (ref 10–30)

## 2017-11-19 LAB — SALICYLATE LEVEL: Salicylate Lvl: <7 mg/dL (ref 2.8–30.0)

## 2017-11-19 MED ORDER — DIPHENHYDRAMINE HCL 50 MG/ML IJ SOLN
50.0000 mg | Freq: Once | INTRAMUSCULAR | Status: AC | PRN
  Administered 2017-11-19: 50 mg via INTRAMUSCULAR
  Filled 2017-11-19: qty 1

## 2017-11-19 MED ORDER — CLOPIDOGREL BISULFATE 75 MG PO TABS
75.0000 mg | ORAL_TABLET | Freq: Every day | ORAL | Status: DC
  Administered 2017-11-20: 75 mg via ORAL
  Filled 2017-11-19: qty 1

## 2017-11-19 MED ORDER — GABAPENTIN 300 MG PO CAPS
300.0000 mg | ORAL_CAPSULE | Freq: Three times a day (TID) | ORAL | Status: DC
  Administered 2017-11-19 – 2017-11-20 (×2): 300 mg via ORAL
  Filled 2017-11-19 (×2): qty 1

## 2017-11-19 MED ORDER — ACETAMINOPHEN 325 MG PO TABS
650.0000 mg | ORAL_TABLET | ORAL | Status: DC | PRN
  Administered 2017-11-19: 650 mg via ORAL
  Filled 2017-11-19: qty 2

## 2017-11-19 MED ORDER — ATORVASTATIN CALCIUM 80 MG PO TABS: 80.0000 mg | ORAL_TABLET | Freq: Every day | ORAL | Status: DC

## 2017-11-19 MED ORDER — DIPHENHYDRAMINE HCL 50 MG/ML IJ SOLN
50.0000 mg | Freq: Once | INTRAMUSCULAR | Status: AC
  Administered 2017-11-19: 50 mg via INTRAMUSCULAR
  Filled 2017-11-19: qty 1

## 2017-11-19 MED ORDER — MIDAZOLAM HCL 2 MG/2ML IJ SOLN
5.0000 mg | Freq: Once | INTRAMUSCULAR | Status: AC
  Administered 2017-11-19: 5 mg via INTRAMUSCULAR
  Filled 2017-11-19: qty 6

## 2017-11-19 MED ORDER — QUETIAPINE FUMARATE 50 MG PO TABS
300.0000 mg | ORAL_TABLET | Freq: Every day | ORAL | Status: DC
  Administered 2017-11-20: 300 mg via ORAL
  Filled 2017-11-19: qty 2

## 2017-11-19 MED ORDER — CARVEDILOL 12.5 MG PO TABS
6.2500 mg | ORAL_TABLET | Freq: Two times a day (BID) | ORAL | Status: DC
  Administered 2017-11-19 – 2017-11-20 (×2): 6.25 mg via ORAL
  Filled 2017-11-19 (×2): qty 1

## 2017-11-19 MED ORDER — DIPHENHYDRAMINE HCL 50 MG/ML IJ SOLN
INTRAMUSCULAR | Status: AC
  Filled 2017-11-19: qty 1

## 2017-11-19 MED ORDER — LURASIDONE HCL 40 MG PO TABS
80.0000 mg | ORAL_TABLET | Freq: Every day | ORAL | Status: DC
  Administered 2017-11-19 – 2017-11-20 (×2): 80 mg via ORAL
  Filled 2017-11-19: qty 2

## 2017-11-19 MED ORDER — OLANZAPINE 5 MG PO TBDP
10.0000 mg | ORAL_TABLET | Freq: Once | ORAL | Status: AC
  Administered 2017-11-19: 10 mg via ORAL
  Filled 2017-11-19: qty 2

## 2017-11-19 MED ORDER — STERILE WATER FOR INJECTION IJ SOLN
INTRAMUSCULAR | Status: AC
  Administered 2017-11-19: 1.2 mL
  Filled 2017-11-19: qty 10

## 2017-11-19 MED ORDER — ZIPRASIDONE MESYLATE 20 MG IM SOLR
20.0000 mg | Freq: Once | INTRAMUSCULAR | Status: AC | PRN
  Administered 2017-11-19: 20 mg via INTRAMUSCULAR
  Filled 2017-11-19: qty 20

## 2017-11-19 MED ORDER — DIAZEPAM 5 MG PO TABS
10.0000 mg | ORAL_TABLET | Freq: Once | ORAL | Status: AC
  Administered 2017-11-19: 10 mg via ORAL
  Filled 2017-11-19: qty 2

## 2017-11-19 MED ORDER — HALOPERIDOL LACTATE 5 MG/ML IJ SOLN
INTRAMUSCULAR | Status: AC
  Filled 2017-11-19: qty 1

## 2017-11-19 MED ORDER — HALOPERIDOL LACTATE 5 MG/ML IJ SOLN
5.0000 mg | Freq: Once | INTRAMUSCULAR | Status: AC
  Administered 2017-11-19: 5 mg via INTRAMUSCULAR
  Filled 2017-11-19: qty 1

## 2017-11-19 NOTE — Progress Notes (Signed)
   11/19/17 1700  Clinical Encounter Type  Visited With Patient;Health care provider  Visit Type Initial;Psychological support;Behavioral Health  Referral From Nurse  Spiritual Encounters  Spiritual Needs Emotional;Prayer  [facility] call ed to meet with patient; patient observed to be lethargic; [place] talked with patient about patient status and circumstance; patient indicated extreme worry about possible incarceration which exacerbated his anxiety and depression per patient; [place] offered spiritual support along with prayer.

## 2017-11-19 NOTE — ED Notes (Addendum)
Staffing called for sitter- pt providing urine sample. Belongings given to family.

## 2017-11-19 NOTE — ED Notes (Addendum)
Psych NP at bedside explaining care plan for pt to go to a rehab/recovery center

## 2017-11-19 NOTE — ED Notes (Signed)
In person TTS in room
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
## 2017-11-19 NOTE — ED Notes (Signed)
Pt using phone at nurse's station

## 2017-11-19 NOTE — ED Notes (Signed)
Pt was cooperative with letting me draw blood

## 2017-11-19 NOTE — ED Notes (Signed)
Pharmacy tech in room to verify home meds pt forgot to mention earlier

## 2017-11-19 NOTE — ED Notes (Addendum)
Pt given [place] sandwich tray prior to RN giving meds Sitter @ bedside

## 2017-11-19 NOTE — ED Notes (Addendum)
Pt states to this RN that he needs long term care help. He said, "whenever I get discharged and go home, I am going to kill myself." Pt stated he cant go to short term care because he will kill himself once he is discharged.

## 2017-11-19 NOTE — ED Triage Notes (Signed)
IVC papers complete , copy faxed to [facility],  Original  IVC in red folder.

## 2017-11-19 NOTE — ED Notes (Signed)
Gave pt ice water, per Luellen Pucker - RN.
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
## 2017-11-19 NOTE — ED Notes (Signed)
Breakfast ordered placed Reg Diet
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
## 2017-11-19 NOTE — ED Notes (Signed)
Pt cooperative when giving injections and stating "wants something to help him calm down". Appreciative.

## 2017-11-19 NOTE — ED Notes (Signed)
Pt resting Sitter @ bedside

## 2017-11-19 NOTE — ED Notes (Signed)
Pt changing into scrubs and being wanded.

## 2017-11-19 NOTE — ED Notes (Signed)
Pt is continuously stating he "wants a shot to go to sleep"

## 2017-11-19 NOTE — ED Triage Notes (Signed)
PT shouting and pounding his head with his fist. Pt aggressive verbal and physical.

## 2017-11-19 NOTE — ED Notes (Signed)
Chaplin at bedside
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
## 2017-11-19 NOTE — ED Notes (Signed)
TTS in process 

## 2017-11-19 NOTE — ED Notes (Signed)
Pt made telephone call X1

## 2017-11-19 NOTE — Care Management (Signed)
11/19/2017  Writer referred the patient to several facilities through epic.  Writer informed the [facility] that the patient is in need of placement.  At present there are no appropriate beds for this patient at [facility]

## 2017-11-19 NOTE — ED Notes (Addendum)
Pt is screaming and hitting self in his head with his face. Security is by door. He is cussing "Goddammit!"  and pacing. PA at bedside to evaluate. Family outside of room. Pt keeps asking if we are going to help him- states he wants long term help. Requesting to go to [facility] asking if we will get him there.

## 2017-11-19 NOTE — ED Notes (Signed)
Pt in bed yelling out. I informed pt there are other people in this unit and to stop yelling.

## 2017-11-19 NOTE — ED Notes (Signed)
Dinner tray delivered.

## 2017-11-19 NOTE — ED Provider Notes (Signed)
RN asked this provider to come evaluate patient in triage as patient is screaming "please help!" Patient states that he is suicidal and "wants long-term help." He requests to go to [facility]. This provider attempted to calm and redirect patient, but he continued to yell. Patient appears internally stimulated. Patient is speaking loudly, but with normal rate and rhythm. Not displaying any manic/hypomanic symptoms. However, he has slurred speech and appears to be under the influence of alcohol or some substance.  Brief medical record review conducted. Per review and medications, patient has history of bipolar disorder.  Order placed for IM Benadryl 50mg  and Haldol 5mg  for acute agitation and psychosis. Will not order Ativan because patient appears intoxicated and increased risk of decreased respiratory drive if benzo and EtOH mixed.  Maury Dus, PA-C 11/19/2017 @ 10:50AM   Ledford Goodson, Jonelle Sports, PA-C 11/19/17 [place], MD 11/20/17 1151

## 2017-11-19 NOTE — ED Notes (Signed)
Pt requesting chaplin. Mable Fill has been called and notified to see this pt.

## 2017-11-20 ENCOUNTER — Inpatient Hospital Stay (HOSPITAL_COMMUNITY)
Admission: AD | Admit: 2017-11-20 | Discharge: 2017-11-21 | DRG: 885 | Disposition: A | Discharge status: Home / Self Care | Payer: Medicare Other | Source: Intra-hospital | Attending: Psychiatry | Admitting: Psychiatry

## 2017-11-20 ENCOUNTER — Encounter (HOSPITAL_COMMUNITY): Payer: Self-pay

## 2017-11-20 ENCOUNTER — Other Ambulatory Visit: Payer: Self-pay

## 2017-11-20 DIAGNOSIS — E1122 Type 2 diabetes mellitus with diabetic chronic kidney disease: Secondary | ICD-10-CM | POA: Diagnosis not present

## 2017-11-20 DIAGNOSIS — E782 Mixed hyperlipidemia: Secondary | ICD-10-CM | POA: Diagnosis present

## 2017-11-20 DIAGNOSIS — Z8249 Family history of ischemic heart disease and other diseases of the circulatory system: Secondary | ICD-10-CM

## 2017-11-20 DIAGNOSIS — Z818 Family history of other mental and behavioral disorders: Secondary | ICD-10-CM | POA: Diagnosis not present

## 2017-11-20 DIAGNOSIS — N189 Chronic kidney disease, unspecified: Secondary | ICD-10-CM | POA: Diagnosis not present

## 2017-11-20 DIAGNOSIS — Z8042 Family history of malignant neoplasm of prostate: Secondary | ICD-10-CM | POA: Diagnosis not present

## 2017-11-20 DIAGNOSIS — F332 Major depressive disorder, recurrent severe without psychotic features: Principal | ICD-10-CM | POA: Diagnosis present

## 2017-11-20 DIAGNOSIS — I129 Hypertensive chronic kidney disease with stage 1 through stage 4 chronic kidney disease, or unspecified chronic kidney disease: Secondary | ICD-10-CM | POA: Diagnosis present

## 2017-11-20 DIAGNOSIS — Z79899 Other long term (current) drug therapy: Secondary | ICD-10-CM | POA: Diagnosis not present

## 2017-11-20 DIAGNOSIS — I4901 Ventricular fibrillation: Secondary | ICD-10-CM | POA: Diagnosis not present

## 2017-11-20 DIAGNOSIS — G2581 Restless legs syndrome: Secondary | ICD-10-CM | POA: Diagnosis present

## 2017-11-20 DIAGNOSIS — K760 Fatty (change of) liver, not elsewhere classified: Secondary | ICD-10-CM | POA: Diagnosis present

## 2017-11-20 DIAGNOSIS — Z888 Allergy status to other drugs, medicaments and biological substances status: Secondary | ICD-10-CM | POA: Diagnosis not present

## 2017-11-20 DIAGNOSIS — I252 Old myocardial infarction: Secondary | ICD-10-CM | POA: Diagnosis not present

## 2017-11-20 DIAGNOSIS — Z981 Arthrodesis status: Secondary | ICD-10-CM | POA: Diagnosis not present

## 2017-11-20 DIAGNOSIS — Z7902 Long term (current) use of antithrombotics/antiplatelets: Secondary | ICD-10-CM

## 2017-11-20 DIAGNOSIS — F419 Anxiety disorder, unspecified: Secondary | ICD-10-CM | POA: Diagnosis present

## 2017-11-20 DIAGNOSIS — Z885 Allergy status to narcotic agent status: Secondary | ICD-10-CM | POA: Diagnosis not present

## 2017-11-20 DIAGNOSIS — R45851 Suicidal ideations: Secondary | ICD-10-CM | POA: Diagnosis present

## 2017-11-20 DIAGNOSIS — Z7984 Long term (current) use of oral hypoglycemic drugs: Secondary | ICD-10-CM | POA: Diagnosis not present

## 2017-11-20 DIAGNOSIS — J45909 Unspecified asthma, uncomplicated: Secondary | ICD-10-CM | POA: Diagnosis not present

## 2017-11-20 DIAGNOSIS — Z825 Family history of asthma and other chronic lower respiratory diseases: Secondary | ICD-10-CM

## 2017-11-20 DIAGNOSIS — I2510 Atherosclerotic heart disease of native coronary artery without angina pectoris: Secondary | ICD-10-CM | POA: Diagnosis present

## 2017-11-20 DIAGNOSIS — Z833 Family history of diabetes mellitus: Secondary | ICD-10-CM | POA: Diagnosis not present

## 2017-11-20 DIAGNOSIS — K219 Gastro-esophageal reflux disease without esophagitis: Secondary | ICD-10-CM | POA: Diagnosis present

## 2017-11-20 DIAGNOSIS — F17210 Nicotine dependence, cigarettes, uncomplicated: Secondary | ICD-10-CM | POA: Diagnosis not present

## 2017-11-20 DIAGNOSIS — Z7982 Long term (current) use of aspirin: Secondary | ICD-10-CM | POA: Diagnosis not present

## 2017-11-20 LAB — GLUCOSE, CAPILLARY: GLUCOSE-CAPILLARY: 202 mg/dL — AB (ref 70–99)

## 2017-11-20 LAB — MRSA PCR SCREENING: MRSA by PCR: POSITIVE — AB

## 2017-11-20 MED ORDER — HALOPERIDOL LACTATE 5 MG/ML IJ SOLN: 5.0000 mg | Freq: Four times a day (QID) | INTRAMUSCULAR | Status: DC | PRN

## 2017-11-20 MED ORDER — ATORVASTATIN CALCIUM 80 MG PO TABS
80.0000 mg | ORAL_TABLET | Freq: Every day | ORAL | Status: DC
  Administered 2017-11-20: 80 mg via ORAL
  Filled 2017-11-20 (×3): qty 1

## 2017-11-20 MED ORDER — HALOPERIDOL 5 MG PO TABS
5.0000 mg | ORAL_TABLET | Freq: Four times a day (QID) | ORAL | Status: DC | PRN
  Administered 2017-11-20 – 2017-11-21 (×2): 5 mg via ORAL
  Filled 2017-11-20 (×2): qty 1

## 2017-11-20 MED ORDER — LURASIDONE HCL 80 MG PO TABS
80.0000 mg | ORAL_TABLET | Freq: Every day | ORAL | Status: DC
  Filled 2017-11-20 (×2): qty 1

## 2017-11-20 MED ORDER — MUPIROCIN CALCIUM 2 % EX CREA
TOPICAL_CREAM | Freq: Two times a day (BID) | CUTANEOUS | Status: DC
  Administered 2017-11-20: 1 via TOPICAL
  Administered 2017-11-21: 08:00:00 via TOPICAL
  Filled 2017-11-20: qty 15

## 2017-11-20 MED ORDER — GABAPENTIN 300 MG PO CAPS
300.0000 mg | ORAL_CAPSULE | Freq: Three times a day (TID) | ORAL | Status: DC
  Administered 2017-11-20 – 2017-11-21 (×3): 300 mg via ORAL
  Filled 2017-11-20 (×9): qty 1

## 2017-11-20 MED ORDER — METFORMIN HCL 500 MG PO TABS
500.0000 mg | ORAL_TABLET | Freq: Two times a day (BID) | ORAL | Status: DC
  Administered 2017-11-20 – 2017-11-21 (×2): 500 mg via ORAL
  Filled 2017-11-20 (×6): qty 1

## 2017-11-20 MED ORDER — CARVEDILOL 6.25 MG PO TABS
6.2500 mg | ORAL_TABLET | Freq: Two times a day (BID) | ORAL | Status: DC
  Administered 2017-11-20 – 2017-11-21 (×2): 6.25 mg via ORAL
  Filled 2017-11-20 (×6): qty 1

## 2017-11-20 MED ORDER — NICOTINE 21 MG/24HR TD PT24
21.0000 mg | MEDICATED_PATCH | Freq: Every day | TRANSDERMAL | Status: DC
  Administered 2017-11-20 – 2017-11-21 (×2): 21 mg via TRANSDERMAL
  Filled 2017-11-20 (×5): qty 1

## 2017-11-20 MED ORDER — LURASIDONE HCL 80 MG PO TABS
80.0000 mg | ORAL_TABLET | Freq: Every day | ORAL | Status: DC
  Administered 2017-11-20: 80 mg via ORAL
  Filled 2017-11-20 (×3): qty 1

## 2017-11-20 MED ORDER — CLOPIDOGREL BISULFATE 75 MG PO TABS
75.0000 mg | ORAL_TABLET | Freq: Every day | ORAL | Status: DC
  Administered 2017-11-21: 75 mg via ORAL
  Filled 2017-11-20 (×3): qty 1

## 2017-11-20 MED ORDER — VORTIOXETINE HBR 10 MG PO TABS
10.0000 mg | ORAL_TABLET | Freq: Every day | ORAL | Status: DC
  Administered 2017-11-20 – 2017-11-21 (×2): 10 mg via ORAL
  Filled 2017-11-20 (×4): qty 1

## 2017-11-20 MED ORDER — MUPIROCIN 2 % EX OINT
TOPICAL_OINTMENT | Freq: Two times a day (BID) | CUTANEOUS | Status: DC
  Filled 2017-11-20: qty 22

## 2017-11-20 MED ORDER — CLONAZEPAM 1 MG PO TABS: 1.0000 mg | ORAL_TABLET | Freq: Every day | ORAL | Status: DC

## 2017-11-20 MED ORDER — OXCARBAZEPINE 300 MG PO TABS
300.0000 mg | ORAL_TABLET | Freq: Every day | ORAL | Status: DC
  Administered 2017-11-20: 300 mg via ORAL
  Filled 2017-11-20 (×2): qty 1

## 2017-11-20 MED ORDER — BACITRACIN-NEOMYCIN-POLYMYXIN 400-5-5000 EX OINT: TOPICAL_OINTMENT | Freq: Two times a day (BID) | CUTANEOUS | Status: DC

## 2017-11-20 MED ORDER — SULFAMETHOXAZOLE-TRIMETHOPRIM 800-160 MG PO TABS
1.0000 | ORAL_TABLET | Freq: Two times a day (BID) | ORAL | Status: DC
  Administered 2017-11-20 – 2017-11-21 (×2): 1 via ORAL
  Filled 2017-11-20 (×8): qty 1

## 2017-11-20 MED ORDER — CLONAZEPAM 0.5 MG PO TABS
1.0000 mg | ORAL_TABLET | Freq: Every day | ORAL | Status: DC
  Administered 2017-11-20: 1 mg via ORAL
  Filled 2017-11-20: qty 2

## 2017-11-20 MED ORDER — QUETIAPINE FUMARATE 300 MG PO TABS
300.0000 mg | ORAL_TABLET | Freq: Every day | ORAL | Status: DC
  Filled 2017-11-20: qty 1

## 2017-11-20 NOTE — ED Notes (Signed)
Pt hitting self and tossing in bed stating "I can't just be sitting here all day" explained to patient that we were waiting for placement. Pt agreed and clamed down.

## 2017-11-20 NOTE — ED Notes (Addendum)
Pt requesting "something to calm me down or a shot to knock me out" Explained to pt that we typically do not give medications to sleep during the day. Pt calm, ambulated to RR with steady gait.
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
## 2017-11-20 NOTE — ED Notes (Signed)
ED MD notified of pt hitting self and yelling, will come speak with patient.

## 2017-11-20 NOTE — Tx Team (Signed)
Initial Treatment Plan 11/20/2017 12:34 PM Michael Schmitt [id number]    PATIENT STRESSORS: Legal issue Marital or family conflict Substance abuse   PATIENT STRENGTHS: Average or above average intelligence Financial means General fund of knowledge   PATIENT IDENTIFIED PROBLEMS: "get long term treatment"  "treat my suicidal thoughts"  Substance Abuse  Suicide Risk               DISCHARGE CRITERIA:  Ability to meet basic life and health needs Adequate post-discharge living arrangements Improved stabilization in mood, thinking, and/or behavior Medical problems require only outpatient monitoring Motivation to continue treatment in a less acute level of care Need for constant or close observation no longer present Reduction of life-threatening or endangering symptoms to within safe limits Safe-care adequate arrangements made Verbal commitment to aftercare and medication compliance Withdrawal symptoms are absent or subacute and managed without 24-hour nursing intervention  PRELIMINARY DISCHARGE PLAN: Outpatient therapy  PATIENT/FAMILY INVOLVEMENT: This treatment plan has been presented to and reviewed with the patient, Michael Schmitt.  The patient and family have been given the opportunity to ask questions and make suggestions.  Annia Friendly, RN 11/20/2017, 12:34 PM

## 2017-11-20 NOTE — Progress Notes (Signed)
Pt accepted to [facility], Bed 302-1 Elmarie Shiley, NP is the accepting provider.  Dr. Myles Lipps, MD is the attending provider.  Call report to [phone]  [email or web address]  Psych ED notified.   Pt is IVC  Pt may be transported by [employer] Pt scheduled  to arrive at [facility] 11:00 AM.  Areatha Keas. Judi Cong, MSW, [place] Disposition Clinical Social Work [phone] (cell) [phone] (office)
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
## 2017-11-20 NOTE — Plan of Care (Signed)
  Problem: Education: Goal: Knowledge of [facility] Education information/materials will improve Outcome: Progressing   Patient has been provided written and verbal admission information. Patient verbalizes understanding.   Problem: Safety: Goal: Periods of time without injury will increase Outcome: Progressing   Patient is on q15 minute safety checks, low fall risk precautions and contracts for safety on the unit.
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
## 2017-11-20 NOTE — BHH Group Notes (Signed)
[facility] Group Therapy 11/20/2017 1:15pm  Type of Therapy: Mental Health Association Presentation  Participation Level: Invited. Chose to remain in bed.    Avelina Laine, LCSW 11/20/2017 3:49 PM

## 2017-11-20 NOTE — ED Notes (Signed)
[employer] called for transportation.

## 2017-11-20 NOTE — ED Notes (Signed)
ED Provider at bedside. 

## 2017-11-21 ENCOUNTER — Encounter (HOSPITAL_COMMUNITY): Payer: Self-pay | Admitting: Behavioral Health

## 2017-11-21 LAB — GLUCOSE, CAPILLARY: GLUCOSE-CAPILLARY: 157 mg/dL — AB (ref 70–99)

## 2017-11-21 MED ORDER — CLOPIDOGREL BISULFATE 75 MG PO TABS: 75.0000 mg | ORAL_TABLET | Freq: Every day | ORAL | Status: DC

## 2017-11-21 MED ORDER — MUPIROCIN CALCIUM 2 % EX CREA: TOPICAL_CREAM | Freq: Two times a day (BID) | CUTANEOUS | 0 refills | Status: DC

## 2017-11-21 MED ORDER — NITROGLYCERIN 0.4 MG SL SUBL: 0.4000 mg | SUBLINGUAL_TABLET | SUBLINGUAL | 0 refills | Status: DC | PRN

## 2017-11-21 MED ORDER — NICOTINE 21 MG/24HR TD PT24: 21.0000 mg | MEDICATED_PATCH | Freq: Every day | TRANSDERMAL | 0 refills | Status: DC

## 2017-11-21 MED ORDER — SULFAMETHOXAZOLE-TRIMETHOPRIM 800-160 MG PO TABS: 1.0000 | ORAL_TABLET | Freq: Two times a day (BID) | ORAL | 0 refills | Status: DC

## 2017-11-21 MED ORDER — OXCARBAZEPINE 300 MG PO TABS
300.0000 mg | ORAL_TABLET | Freq: Two times a day (BID) | ORAL | Status: DC
  Filled 2017-11-21 (×4): qty 1

## 2017-11-21 MED ORDER — TRAZODONE HCL 100 MG PO TABS
100.0000 mg | ORAL_TABLET | Freq: Every evening | ORAL | Status: DC | PRN
  Administered 2017-11-21: 100 mg via ORAL
  Filled 2017-11-21: qty 1

## 2017-11-21 MED ORDER — TRINTELLIX 10 MG PO TABS: 10.0000 mg | ORAL_TABLET | Freq: Every day | ORAL | 0 refills | Status: DC

## 2017-11-21 MED ORDER — OXCARBAZEPINE 300 MG PO TABS: 300.0000 mg | ORAL_TABLET | Freq: Every day | ORAL | 0 refills | Status: DC

## 2017-11-21 MED ORDER — TRAZODONE HCL 100 MG PO TABS: 100.0000 mg | ORAL_TABLET | Freq: Every evening | ORAL | 0 refills | Status: DC | PRN

## 2017-11-21 MED ORDER — GABAPENTIN 300 MG PO CAPS: 300.0000 mg | ORAL_CAPSULE | Freq: Three times a day (TID) | ORAL | 0 refills | Status: DC

## 2017-11-21 MED ORDER — METFORMIN HCL 500 MG PO TABS: 500.0000 mg | ORAL_TABLET | Freq: Two times a day (BID) | ORAL | Status: DC

## 2017-11-21 MED ORDER — ATORVASTATIN CALCIUM 80 MG PO TABS: 80.0000 mg | ORAL_TABLET | Freq: Every day | ORAL | 0 refills | Status: DC

## 2017-11-21 MED ORDER — LURASIDONE HCL 80 MG PO TABS: 80.0000 mg | ORAL_TABLET | Freq: Every day | ORAL | 0 refills | Status: DC

## 2017-11-21 MED ORDER — CARVEDILOL 6.25 MG PO TABS: 6.2500 mg | ORAL_TABLET | Freq: Two times a day (BID) | ORAL | 0 refills | Status: DC

## 2017-11-21 MED ORDER — ASPIRIN 81 MG PO TBEC: 81.0000 mg | DELAYED_RELEASE_TABLET | Freq: Every day | ORAL | 1 refills | Status: DC

## 2017-11-21 NOTE — Progress Notes (Signed)
Patient was irritable and labile upon approach during morning medication pass. Given Haldol 5 mg due to irritability and lability. Patient said he was ready to discharge, then said he was not ready and started stressing about where he will be discharged to. Patient said "as soon as I get on the bus I'm going home to get my shit." Writer told patient whatever he does after discharge is none of our business and he is no longer our responsibility. Denies SI HI AVH.  Patient safety maintained with 15 minute checks as well as environmental checks. Will continue to monitor.

## 2017-11-21 NOTE — Progress Notes (Signed)
Discharge note: Patient reviewed discharge paperwork with RN including prescriptions, follow up appointments, and lab work. Patient given the opportunity to ask questions. All concerns were addressed. All belongings were returned to patient. Denied SI/HI/AVH.  Patient was discharged to lobby with bus pass in hand. On the way out, patient went into the restroom and started yelling and screaming. Patient was walked out with Probation officer, MHT, and security. Patient was given resources to homeless shelters and rescue missions. Patient was obviously labile and irritated during discharge process.

## 2017-11-21 NOTE — Tx Team (Addendum)
Interdisciplinary Treatment and Diagnostic Plan Update  11/21/2017 Time of Session: 0830AM Michael Schmitt MRN: [id number]  Principal Diagnosis: MDD, recurrent, severe  Secondary Diagnoses: Active Problems:   MDD (major depressive disorder), recurrent episode, severe (HCC)   Current Medications:  Current Facility-Administered Medications  Medication Dose Route Frequency Provider Last Rate Last Dose  . atorvastatin (LIPITOR) tablet 80 mg  80 mg Oral q1800 Rankin, Shuvon B, NP   80 mg at 11/20/17 1700  . carvedilol (COREG) tablet 6.25 mg  6.25 mg Oral BID WC Rankin, Shuvon B, NP   6.25 mg at 11/21/17 0817  . clopidogrel (PLAVIX) tablet 75 mg  75 mg Oral Daily Rankin, Shuvon B, NP   75 mg at 11/21/17 0817  . gabapentin (NEURONTIN) capsule 300 mg  300 mg Oral TID Rankin, Shuvon B, NP   300 mg at 11/21/17 0817  . haloperidol (HALDOL) tablet 5 mg  5 mg Oral Q6H PRN Sharma Covert, MD   5 mg at 11/20/17 2134   Or  . haloperidol lactate (HALDOL) injection 5 mg  5 mg Intramuscular Q6H PRN Sharma Covert, MD      . lurasidone (LATUDA) tablet 80 mg  80 mg Oral QHS Sharma Covert, MD   80 mg at 11/20/17 2133  . metFORMIN (GLUCOPHAGE) tablet 500 mg  500 mg Oral BID WC Sharma Covert, MD   500 mg at 11/21/17 0817  . mupirocin cream (BACTROBAN) 2 %   Topical BID Sharma Covert, MD      . neomycin-bacitracin-polymyxin (NEOSPORIN) ointment   Topical BID Sharma Covert, MD      . nicotine (NICODERM CQ - dosed in mg/24 hours) patch 21 mg  21 mg Transdermal Daily Sharma Covert, MD   21 mg at 11/21/17 0817  . Oxcarbazepine (TRILEPTAL) tablet 300 mg  300 mg Oral QHS Sharma Covert, MD   300 mg at 11/20/17 2133  . sulfamethoxazole-trimethoprim (BACTRIM DS,SEPTRA DS) 800-160 MG per tablet 1 tablet  1 tablet Oral Q12H Lindon Romp A, NP   1 tablet at 11/21/17 0820  . traZODone (DESYREL) tablet 100 mg  100 mg Oral QHS PRN Lindon Romp A, NP   100 mg at 11/21/17 0041  .  vortioxetine HBr (TRINTELLIX) tablet 10 mg  10 mg Oral Daily Sharma Covert, MD   10 mg at 11/21/17 6789   PTA Medications: Medications Prior to Admission  Medication Sig Dispense Refill Last Dose  . aspirin EC 81 MG EC tablet Take 1 tablet (81 mg total) by mouth daily. (Patient not taking: Reported on 11/19/2017) 30 tablet 1 Not Taking at Unknown time  . atorvastatin (LIPITOR) 80 MG tablet Take 1 tablet (80 mg total) by mouth daily at 6 PM. 30 tablet 0 11/19/2017 at Unknown time  . carvedilol (COREG) 6.25 MG tablet Take 1 tablet (6.25 mg total) by mouth 2 (two) times daily with a meal. 60 tablet 0 11/19/2017 at am  . chlorproMAZINE (THORAZINE) 200 MG tablet Take 400 mg by mouth at bedtime.   unknown  . clonazePAM (KLONOPIN) 0.5 MG tablet Take 0.5 mg by mouth 3 (three) times daily.  0 Past Week at Unknown time  . clopidogrel (PLAVIX) 75 MG tablet Take 1 tablet (75 mg total) by mouth daily. 30 tablet 0 11/19/2017 at am  . doxepin (SINEQUAN) 50 MG capsule Take 50 mg by mouth at bedtime.  0 11/18/2017 at Unknown time  . escitalopram (LEXAPRO) 20 MG tablet Take  1 tablet (20 mg total) by mouth daily. 20 tablet 1 11/19/2017 at Unknown time  . hydrOXYzine (ATARAX/VISTARIL) 50 MG tablet Take 1 tablet (50 mg total) by mouth 3 (three) times daily as needed for anxiety. 60 tablet 1 11/19/2017 at Unknown time  . lurasidone (LATUDA) 80 MG TABS tablet Take 80 mg by mouth daily.   Not Taking at Unknown time  . Lurasidone HCl 60 MG TABS Take 1 tablet (60 mg total) by mouth daily with breakfast. (Patient not taking: Reported on 07/08/2017) 30 tablet 1 Not Taking at Unknown time  . Melatonin 5 MG TABS Take 5 mg by mouth at bedtime.   11/18/2017 at Unknown time  . metFORMIN (GLUCOPHAGE) 500 MG tablet Take 1 tablet (500 mg total) by mouth 2 (two) times daily with a meal. 60 tablet 0 11/19/2017 at Unknown time  . mirtazapine (REMERON) 45 MG tablet Take 1 tablet (45 mg total) by mouth at bedtime. 30 tablet 1 11/19/2017 at  Unknown time  . nitroGLYCERIN (NITROSTAT) 0.4 MG SL tablet Place 1 tablet (0.4 mg total) under the tongue every 5 (five) minutes x 3 doses as needed. (Patient taking differently: Place 0.4 mg under the tongue every 5 (five) minutes x 3 doses as needed for chest pain. ) 10 tablet 0 never  . OLANZapine (ZYPREXA) 20 MG tablet Take 20 mg by mouth at bedtime.  0 Not Taking at Unknown time  . OLANZapine (ZYPREXA) 5 MG tablet Take 5 mg by mouth daily.  0 Not Taking at Unknown time  . prazosin (MINIPRESS) 2 MG capsule Take 1 capsule (2 mg total) by mouth at bedtime. 30 capsule 1 11/18/2017 at Unknown time  . QUEtiapine (SEROQUEL) 300 MG tablet Take 1 tablet (300 mg total) by mouth at bedtime. 30 tablet 1 11/18/2017 at Unknown time  . QUEtiapine (SEROQUEL) 400 MG tablet Take 400 mg by mouth at bedtime.  1 unk  . risperiDONE (RISPERDAL) 1 MG tablet Take 1 mg by mouth 2 (two) times daily.  1 11/18/2017 at Unknown time  . temazepam (RESTORIL) 30 MG capsule Take 30 mg by mouth at bedtime.  0 11/18/2017 at Unknown time  . traZODone (DESYREL) 100 MG tablet Take 1 tablet (100 mg total) by mouth at bedtime as needed for sleep. (Patient not taking: Reported on 11/19/2017) 30 tablet 1 Not Taking at Unknown time  . TRINTELLIX 10 MG TABS tablet Take 10 mg by mouth daily.  0 11/18/2017 at Unknown time  . zolpidem (AMBIEN) 10 MG tablet Take 1 tablet (10 mg total) by mouth at bedtime. 30 tablet 1 Past Week at Unknown time    Patient Stressors: Legal issue Marital or family conflict Substance abuse  Patient Strengths: Average or above average intelligence Financial means General fund of knowledge  Treatment Modalities: Medication Management, Group therapy, Case management,  1 to 1 session with clinician, Psychoeducation, Recreational therapy.   Physician Treatment Plan for Primary Diagnosis: MDD, recurrent, severe Long Term Goal(s): Improvement in symptoms so as ready for discharge Improvement in symptoms so as ready  for discharge   Short Term Goals: Ability to identify changes in lifestyle to reduce recurrence of condition will improve Ability to verbalize feelings will improve Ability to disclose and discuss suicidal ideas Ability to demonstrate self-control will improve Ability to identify and develop effective coping behaviors will improve Ability to maintain clinical measurements within normal limits will improve Compliance with prescribed medications will improve Ability to identify triggers associated with substance abuse/mental health issues  will improve Ability to identify changes in lifestyle to reduce recurrence of condition will improve Ability to verbalize feelings will improve Ability to disclose and discuss suicidal ideas Ability to demonstrate self-control will improve Ability to identify and develop effective coping behaviors will improve Ability to maintain clinical measurements within normal limits will improve Compliance with prescribed medications will improve Ability to identify triggers associated with substance abuse/mental health issues will improve  Medication Management: Evaluate patient's response, side effects, and tolerance of medication regimen.  Therapeutic Interventions: 1 to 1 sessions, Unit Group sessions and Medication administration.  Evaluation of Outcomes: Adequate for discharge  Physician Treatment Plan for Secondary Diagnosis: Active Problems:   MDD (major depressive disorder), recurrent episode, severe ([place])  Long Term Goal(s): Improvement in symptoms so as ready for discharge Improvement in symptoms so as ready for discharge   Short Term Goals: Ability to identify changes in lifestyle to reduce recurrence of condition will improve Ability to verbalize feelings will improve Ability to disclose and discuss suicidal ideas Ability to demonstrate self-control will improve Ability to identify and develop effective coping behaviors will improve Ability to  maintain clinical measurements within normal limits will improve Compliance with prescribed medications will improve Ability to identify triggers associated with substance abuse/mental health issues will improve Ability to identify changes in lifestyle to reduce recurrence of condition will improve Ability to verbalize feelings will improve Ability to disclose and discuss suicidal ideas Ability to demonstrate self-control will improve Ability to identify and develop effective coping behaviors will improve Ability to maintain clinical measurements within normal limits will improve Compliance with prescribed medications will improve Ability to identify triggers associated with substance abuse/mental health issues will improve     Medication Management: Evaluate patient's response, side effects, and tolerance of medication regimen.  Therapeutic Interventions: 1 to 1 sessions, Unit Group sessions and Medication administration.  Evaluation of Outcomes: Adequate for discharge   RN Treatment Plan for Primary Diagnosis: MDD, recurrent, severe Long Term Goal(s): Knowledge of disease and therapeutic regimen to maintain health will improve  Short Term Goals: Ability to remain free from injury will improve, Ability to verbalize frustration and anger appropriately will improve, Ability to demonstrate self-control and Ability to disclose and discuss suicidal ideas  Medication Management: RN will administer medications as ordered by provider, will assess and evaluate patient's response and provide education to patient for prescribed medication. RN will report any adverse and/or side effects to prescribing provider.  Therapeutic Interventions: 1 on 1 counseling sessions, Psychoeducation, Medication administration, Evaluate responses to treatment, Monitor vital signs and CBGs as ordered, Perform/monitor CIWA, COWS, AIMS and Fall Risk screenings as ordered, Perform wound care treatments as  ordered.  Evaluation of Outcomes: Adequate for discharge  LCSW Treatment Plan for Primary Diagnosis: MDD, recurrent, severe Long Term Goal(s): Safe transition to appropriate next level of care at discharge, Engage patient in therapeutic group addressing interpersonal concerns.  Short Term Goals: Engage patient in aftercare planning with referrals and resources, Facilitate patient progression through stages of change regarding substance use diagnoses and concerns, Identify triggers associated with mental health/substance abuse issues and Increase skills for wellness and recovery  Therapeutic Interventions: Assess for all discharge needs, 1 to 1 time with Social worker, Explore available resources and support systems, Assess for adequacy in community support network, Educate family and significant other(s) on suicide prevention, Complete Psychosocial Assessment, Interpersonal group therapy.  Evaluation of Outcomes: Progressing   Progress in Treatment: Attending groups: Yes. Participating in groups: Yes. Taking medication as  prescribed: Yes. Toleration medication: Yes. Family/Significant other contact made: Yes, individual(s) contacted:  pt's sister for collateral information and to complete SPE. Patient understands diagnosis: Yes. Discussing patient identified problems/goals with staff: Yes. Medical problems stabilized or resolved: Yes. Denies suicidal/homicidal ideation: Yes. Issues/concerns per patient self-inventory: No. Other: n/a   New problem(s) identified: No, Describe:  n/a  New Short Term/Long Term Goal(s) medication management for mood stabilization; elimination of SI thoughts; development of comprehensive mental wellness/sobriety plan.   Patient Goals:  "To get help for my suicidal thoughts."   Discharge Plan or Barriers:  Pt plans to follow-up at [facility]. He is not allowed to return home and plans to go to [facility] for possible admission  or to local shelter--pt provided with [employer]. [employer] [facility] [employer] provided, [place] and [facility] information also provided to pt.  [place] pamphlet, [facility] information, and AA/NA information provided to patient for additional community support and resources.   Reason for Continuation of Hospitalization: none  Estimated Length of Stay: Wed, 8/28  Attendees: Patient: 11/21/2017 9:38 AM  Physician: Dr. Mallie Darting MD; Dr. Nancy Fetter MD 11/21/2017 9:38 AM  Nursing: Estill Bamberg RN; Legrand Como RN 11/21/2017 9:38 AM  RN Care Manager:x 11/21/2017 9:38 AM  Social Worker: Janice Norrie LCSW 11/21/2017 9:38 AM  Recreational Therapist: x 11/21/2017 9:38 AM  Other: Lindell Spar NP; Ricky Ala NP 11/21/2017 9:38 AM  Other:  11/21/2017 9:38 AM  Other: 11/21/2017 9:38 AM    Scribe for Treatment Team: Avelina Laine, LCSW 11/21/2017 9:38 AM

## 2017-11-21 NOTE — Progress Notes (Signed)
  [facility] Adult Case Management Discharge Plan :  Will you be returning to the same living situation after discharge:  No.Pt not allowed to return home. Shelter list; [place] list, and [place] rescue mission information.  At discharge, do you have transportation home?: Yes,  bus pass and PART bus ticket provided Do you have the ability to pay for your medications: Yes,  [employer]  Release of information consent forms completed and submitted to medical records by CSW.  Patient to Follow up at: Follow-up Information    [facility] Follow up on 11/28/2017.   Why:  Hospital follow-up on Wed, 9/4 at 2:45PM. Thank you.  Contact information: [place] [address] [phone]           Next level of care provider has access to [place] and Suicide Prevention discussed: Yes,  SPE completed with pt's sister.   Have you used any form of tobacco in the last 30 days? (Cigarettes, Smokeless Tobacco, Cigars, and/or Pipes): Yes  Has patient been referred to the Quitline?: Patient refused referral  Patient has been referred for addiction treatment: Yes  Avelina Laine, LCSW 11/21/2017, 11:29 AM

## 2017-11-21 NOTE — BHH Suicide Risk Assessment (Signed)
[facility] Discharge Suicide Risk Assessment   Principal Problem: <principal problem not specified> Discharge Diagnoses:  Patient Active Problem List   Diagnosis Date Noted  . MDD (major depressive disorder), recurrent episode, severe ([place]) [F33.2] 11/20/2017  . Dysthymia [F34.1] 10/26/2017  . Severe recurrent major depression without psychotic features ([place]) [F33.2] 03/12/2017  . Generalized anxiety disorder [F41.1] 02/05/2017  . Chronic neck pain (Primary Area of Pain) (Left) [M54.2, G89.29] 11/22/2016  . Cervical facet syndrome (Left) [Z16.967] 11/22/2016  . Cervical radiculitis (Left) [M54.12] 11/22/2016  . History of cervical spinal surgery [Z98.890] 11/22/2016  . Cervical spondylosis [M47.22] 11/22/2016  . Chronic shoulder pain (Secondary Area of Pain) (Left) [M25.512, G89.29] 11/22/2016  . 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 Chronic low back pain [facility] Area of Pain) (Bilateral) (L>R) [M54.5, G89.29] 11/22/2016  . Lumbar facet syndrome (Bilateral) (L>R) [M47.816] 11/22/2016  . Chronic pain syndrome [G89.4] 11/21/2016  . Long term prescription benzodiazepine use [Z79.899] 11/21/2016  . Opiate use [F11.90] 11/21/2016  . Uncontrolled type 2 diabetes mellitus without complication, without long-term current use of insulin ([place]) [E11.65] 10/19/2016  . Chronic Suicidal ideation [R45.851] 09/15/2016  . Coronary artery disease [I25.10] 09/12/2016  . HTN (hypertension) [I10] 06/26/2016  . GERD (gastroesophageal reflux disease) [K21.9] 06/26/2016  . Alcohol use disorder, severe, 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[place]) [F10.21] 06/26/2016  . 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 Ventricular fibrillation ([place]) [I49.01] 04/05/2016  . 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 Cervical spondylosis without myelopathy [M47.812] 07/24/2014  . Severe episode of recurrent  major depressive disorder, without psychotic features ([place]) [F33.2] 04/15/2013  . Tobacco use disorder [F17.200] 10/20/2010  . Asthma [J45.909] 10/20/2010    Total Time spent with patient: 15 minutes  Musculoskeletal: Strength & Muscle Tone: within normal limits Gait & Station: normal Patient leans: N/A  Psychiatric Specialty Exam: Review of Systems  Skin: Positive for rash.  All other systems reviewed and are negative.   Blood pressure 129/73, pulse 65, temperature 97.8 F (36.6 C), resp. rate 20, height 6\' 2"  (1.88 m), weight 98.4 kg, SpO2 98 %.Body mass index is 27.86 kg/m.  General Appearance: Casual  Eye Contact::  Fair  Speech:  Normal Rate409  Volume:  Decreased  Mood:  Irritable  Affect:  Congruent  Thought Process:  Coherent and Descriptions of Associations: Intact  Orientation:  Full (Time, Place, and Person)  Thought Content:  Logical  Suicidal Thoughts:  No  Homicidal Thoughts:  No  Memory:  Immediate;   Fair Recent;   Fair Remote;   Fair  Judgement:  Impaired  Insight:  Lacking  Psychomotor Activity:  Normal  Concentration:  Fair  Recall:  [employer] of Knowledge:Fair  Language: Fair  Akathisia:  Negative  Handed:  Right  AIMS (if indicated):     Assets:  Housing Resilience  Sleep:  Number of Hours: 6.25  Cognition: WNL  ADL's:  Intact   Mental Status Per Nursing Assessment::   On Admission:  Suicidal ideation indicated by patient, Thoughts of violence towards others, Self-harm behaviors  Demographic Factors:  Male, Divorced or widowed, Caucasian, Low socioeconomic status and Unemployed  Loss Factors: Legal issues  Historical Factors: Impulsivity  Risk Reduction Factors:   Living with another person, especially a relative  Continued Clinical Symptoms:  Depression:   Comorbid alcohol abuse/dependence Impulsivity Alcohol/Substance Abuse/Dependencies Personality Disorders:   Cluster C  Cognitive Features That Contribute To Risk:  Thought constriction (tunnel vision)    Suicide Risk:  Minimal: No identifiable suicidal ideation.  Patients presenting with no risk factors but with morbid ruminations; may be classified as minimal risk based on the severity of the depressive symptoms  Follow-up Information    [facility] Follow up.   Why:  Message left requesting appt. Also, Walk in hours: Monday-Friday 9am-4pm. Contact information: [place] [address] [phone]           Plan Of Care/Follow-up recommendations:  Activity:  ad lib  Sharma Covert, MD 11/21/2017, 10:41 AM

## 2017-11-21 NOTE — Discharge Summary (Signed)
Physician Discharge Summary Note  Patient:  Michael Schmitt is an 47 y.o., male  MRN:  [id number]  DOB:  April 21, 1970  Patient phone:  [phone] (home)   Patient address:   [place] [address],   Total Time spent with patient: Greater than 30 minutes  Date of Admission:  11/20/2017  Date of Discharge: 11-21-2017  Reason for Admission: Suicidal ideation.  Principal Problem: MDD (major depressive disorder), recurrent episode, severe [facility])  Discharge Diagnoses: Patient Active Problem List   Diagnosis Date Noted  . MDD (major depressive disorder), recurrent episode, severe ([place]) [F33.2] 11/20/2017    Priority: High  . Dysthymia [F34.1] 10/26/2017  . Severe recurrent major depression without psychotic features ([place]) [F33.2] 03/12/2017  . Generalized anxiety disorder [F41.1] 02/05/2017  . Chronic neck pain (Primary Area of Pain) (Left) [M54.2, G89.29] 11/22/2016  . Cervical facet syndrome (Left) [S56.812] 11/22/2016  . Cervical radiculitis (Left) [M54.12] 11/22/2016  . History of cervical spinal surgery [Z98.890] 11/22/2016  . Cervical spondylosis [M47.22] 11/22/2016  . Chronic shoulder pain (Secondary Area of Pain) (Left) [M25.512, G89.29] 11/22/2016  . 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 Chronic low back pain [facility] Area of Pain) (Bilateral) (L>R) [M54.5, G89.29] 11/22/2016  . Lumbar facet syndrome (Bilateral) (L>R) [M47.816] 11/22/2016  . Chronic pain syndrome [G89.4] 11/21/2016  . Long term prescription benzodiazepine use [Z79.899] 11/21/2016  . Opiate use [F11.90] 11/21/2016  . Uncontrolled type 2 diabetes mellitus without complication, without long-term current use of insulin ([place]) [E11.65] 10/19/2016  . Chronic Suicidal ideation [R45.851] 09/15/2016  . Coronary artery disease [I25.10] 09/12/2016  . HTN (hypertension) [I10] 06/26/2016  . GERD (gastroesophageal reflux disease) [K21.9] 06/26/2016  . Alcohol use disorder,  severe, 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[place]) [F10.21] 06/26/2016  . Moderate benzodiazepine use disorder ([place]) [F13.20] 06/11/2016  . Sedative, hypnotic or anxiolytic use disorder, severe, dependence ([place]) [F13.20] 05/12/2016  . Ventricular fibrillation ([place]) [I49.01] 04/05/2016  . 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 Cervical spondylosis without myelopathy [M47.812] 07/24/2014  . Severe episode of recurrent major depressive disorder, without psychotic features ([place]) [F33.2] 04/15/2013  . Tobacco use disorder [F17.200] 10/20/2010  . Asthma [J45.909] 10/20/2010   Past Psychiatric History: Patient has a long history of mood disorder and anxiety disorder which has not really shown consistent improvement with medication management.  He has had a history as well of complicating this by overusing and misusing prescription medication.  Patient has a history of suicide attempts of self injury of noncompliance.  Multiple presentations to the emergency room.  Past Medical History:  Past Medical History:  Diagnosis Date  . Anxiety   . Anxiety   . Arthritis    cerv. stenosis, spondylosis, HNP- lower back , has been followed in pain clinic, has  had injection s in cerv. area  . Blood dyscrasia    told that when he was young he was a" free bleeder"  . CAD (coronary artery disease)   . Cervical spondylosis without myelopathy 07/24/2014  . Cervicogenic headache 07/24/2014  . Chronic kidney disease    renal calculi- passed spontaneously  . Depression   . Diabetes mellitus without complication ([place])   . Fatty liver   . GERD (gastroesophageal reflux disease)   . Headache(784.0)   . Hyperlipidemia   . Hypertension   . Mental disorder   . MI, old   . RLS (restless legs syndrome)    detected on sleep study  . Shortness of breath   . Ventricular fibrillation (  [place]) 04/05/2016    Past Surgical History:  Procedure Laterality Date  . ANTERIOR CERVICAL DECOMP/DISCECTOMY FUSION  11/13/2011   Procedure:  ANTERIOR CERVICAL DECOMPRESSION/DISCECTOMY FUSION 1 LEVEL;  Surgeon: [name], MD;  Location: [place] [facility];  Service: Neurosurgery;  Laterality: N/A;  Anterior Cervical Decompression/discectomy Fusion. Cervical three-four.  Marland Kitchen CARDIAC CATHETERIZATION N/A 01/01/2015   Procedure: Left Heart Cath and Coronary Angiography;  Surgeon: [name], MD;  Location: [place] [facility];  Service: Cardiovascular;  Laterality: N/A;  . CARDIAC CATHETERIZATION N/A 04/05/2016   Procedure: Left Heart Cath and Coronary Angiography;  Surgeon: Belva Crome, MD;  Location: [place] CV LAB;  Service: Cardiovascular;  Laterality: N/A;  . CARDIAC CATHETERIZATION N/A 04/05/2016   Procedure: Coronary Stent Intervention;  Surgeon: Belva Crome, MD;  Location: [place] CV LAB;  Service: Cardiovascular;  Laterality: N/A;  . CARDIAC CATHETERIZATION N/A 04/05/2016   Procedure: Intravascular Ultrasound/IVUS;  Surgeon: Belva Crome, MD;  Location: [place] CV LAB;  Service: Cardiovascular;  Laterality: N/A;  . NASAL SINUS SURGERY     2005   Family History:  Family History  Problem Relation Age of Onset  . Prostate cancer Father   . Hypertension Mother   . Kidney Stones Mother   . Anxiety disorder Mother   . Depression Mother   . COPD Sister   . Hypertension Sister   . Diabetes Sister   . Depression Sister   . Anxiety disorder Sister   . Seizures Sister   . ADD / ADHD Son   . ADD / ADHD Daughter    Family Psychiatric  History: Positive for anxiety  Social History:  Social History   Substance and Sexual Activity  Alcohol Use No  . Alcohol/week: 0.0 standard drinks   Comment: last drinked in 3 months.      Social History   Substance and Sexual Activity  Drug Use No    Social History   Socioeconomic History  . Marital status: Widowed    Spouse name: Not on file  . Number of children: 3  . Years of education: 60  . Highest education level: Not on file  Occupational History    Comment:  unemployed  Social Needs  . Financial resource strain: Not on file  . Food insecurity:    Worry: Not on file    Inability: Not on file  . Transportation needs:    Medical: Not on file    Non-medical: Not on file  Tobacco Use  . Smoking status: Current Every Day Smoker    Packs/day: 2.00    Years: 17.00    Pack years: 34.00    Types: Cigarettes, Cigars  . Smokeless tobacco: Former Systems developer  . Tobacco comment: occassional snuff   Substance and Sexual Activity  . Alcohol use: No    Alcohol/week: 0.0 standard drinks    Comment: last drinked in 3 months.   . Drug use: No  . Sexual activity: Not Currently  Lifestyle  . Physical activity:    Days per week: Not on file    Minutes per session: Not on file  . Stress: Not on file  Relationships  . Social connections:    Talks on phone: Not on file    Gets together: Not on file    Attends religious service: Not on file    Active member of club or organization: Not on file    Attends meetings of clubs or organizations: Not on file  Relationship status: Not on file  Other Topics Concern  . Not on file  Social History Narrative   Patient is right handed.   Patient drinks 2 sodas daily.   Hospital Course: (Per Md's admission notes):  Patient is a 47 year old male originally seen at [facility], but then discharged, and then presented to the [facility] emergency department for evaluation of suicidal ideation. The patient is quite circumstantial overall the things have taken place recently. The old record notes stated that the patient had presented to [facility] "many times", but this was his first admission to the behavioral health hospital. The patient stated he had been admitted to the [facility] "lots of times". The patient stated that he was both "suicidal and depressed".  He also stated that he had been this way for "a long time".  He stated he felt as though he had been depressed his entire life.  He stated that  none of his depressive treatments it ever helped.  He stated he had gotten ECT with [name] in the past.  He stated that he had just gotten out of facility in [place] named [facility].  He stated he had been there 9 days.  He stated a friend of his from [facility] to taken them there.  The patient denied any recent issues with substances.  His drug screen had been positive for benzodiazepines as well as opiates.  It looks that he did receive benzodiazepines while he was in the facility in [place].  He is very drug seeking and wants "Klonopin".  He was unable to quantitate what depression meant to him.  He also admitted to having recent arrest and court dates for a DUI and assault of the male.  Sometime over the last several days he was laced on Latuda and Trintellix.  He is been on multiple antipsychotics.  While he was in the at [facility] emergency room he was banging his head, yelling.  He required Haldol and Benadryl.  He stated that he does not sleep well.  He was admitted to the hospital for evaluation and stabilization.  Gery's stay in this hospital was rather very brief. He came in to the hospital with worsening symptoms of depression & suicidal ideations. He was well established at the [facility] in [place] where he had received ECT for his chronic depression. He was recently discharged from this hospital prior to coming to the [facility] ED with complaints of suicidal ideations. He was seeking further mental health care.  After his presenting symptoms were evaluated & noted. The medication regimen targeting those symptoms were initiated by resuming his pertinent home medications for his mental illness. See list of medications below. He was also enrolled in the group counseling sessions to learn coping skills that should help him after discharge to cope better & effectively to maintain mood stability. He presented other significant pre-existing medical issues that required  treatment & or monitoring. He was resumed on all his pertinent home medications for those health issues. He tolerated his treatment regimen without any significant adverse effects & or reactions reported.  Today during his follow-up care assessment with the attending psychiatrist, Coy decided & asked to be discharged to go back to his home to follow-up care on an outpatient basis. He states that he is committed to abstaining from illegal substances & continue to take his mental health medications as recommended. He denied any SIHI, AVH, delusional thoughts or paranoia. He does not appear  to be responding to any internal stimuli.  And because there are no clinical criteria to keep [facility] admitted to the hospital, he is discharged as he requested.  Upon discharge, he appears much more in control of his mood & behavior than upon admission. His symptoms were reported as significantly improved or completely resolved. There are currently, no active SI plans or intent, AVH, delusional thoughts or paranoia. He is going to pursue outpatient treatment on an outpatient basis as noted below. He left [facility] with all personal belongings in no apparent distress.  Physical Findings: AIMS: Facial and Oral Movements Muscles of Facial Expression: None, normal Lips and Perioral Area: None, normal Jaw: None, normal Tongue: None, normal,Extremity Movements Upper (arms, wrists, hands, fingers): None, normal Lower (legs, knees, ankles, toes): None, normal, Trunk Movements Neck, shoulders, hips: None, normal, Overall Severity Severity of abnormal movements (highest score from questions above): None, normal Incapacitation due to abnormal movements: None, normal Patient's awareness of abnormal movements (rate only patient's report): No Awareness, Dental Status Current problems with teeth and/or dentures?: No Does patient usually wear dentures?: No  CIWA:  CIWA-Ar Total: 2 COWS:     Musculoskeletal: Strength & Muscle  Tone: within normal limits Gait & Station: normal Patient leans: N/A  Psychiatric Specialty Exam: Physical Exam  Nursing note and vitals reviewed. Constitutional: He appears well-developed and well-nourished.  HENT:  Head: Normocephalic and atraumatic.  Eyes: Pupils are equal, round, and reactive to light. Conjunctivae are normal.  Neck: Normal range of motion.  Cardiovascular: Regular rhythm and normal heart sounds.  Respiratory: Effort normal. No respiratory distress.  GI: Soft.  Genitourinary:  Genitourinary Comments: Deferred  Musculoskeletal: Normal range of motion.  Neurological: He is alert.  Skin: Skin is warm and dry.  Psychiatric: Judgment normal. His affect is blunt. His speech is delayed. He is slowed. Thought content is not paranoid. Cognition and memory are normal. He expresses no homicidal and no suicidal ideation.    Review of Systems  Constitutional: Negative.   HENT: Negative.   Eyes: Negative.   Respiratory: Negative.   Cardiovascular: Negative.   Gastrointestinal: Negative.   Musculoskeletal: Negative.   Skin: Negative.   Neurological: Negative.   Endo/Heme/Allergies: Negative.   Psychiatric/Behavioral: Positive for depression (Stable) and substance abuse (Hx. Benzodiazepine & opioid use disorder). Negative for hallucinations, memory loss and suicidal ideas. The patient has insomnia (Stable). The patient is not nervous/anxious.     Blood pressure 129/73, pulse 65, temperature 97.8 F (36.6 C), resp. rate 20, height 6\' 2"  (1.88 m), weight 98.4 kg, SpO2 98 %.Body mass index is 27.86 kg/m.  See Md's discharge SRA   Have you used any form of tobacco in the last 30 days? (Cigarettes, Smokeless Tobacco, Cigars, and/or Pipes): Yes  Has this patient used any form of tobacco in the last 30 days? (Cigarettes, Smokeless Tobacco, Cigars, and/or Pipes): Yes, A prescription for an FDA-approved tobacco cessation medication was recommended at discharge.  Blood Alcohol  level:  Lab Results  Component Value Date   ETH <10 11/19/2017   ETH <10 94/85/4627   Metabolic Disorder Labs:  Lab Results  Component Value Date   HGBA1C 6.0 (H) 03/13/2017   MPG 126 03/13/2017   MPG 177 09/12/2016   Lab Results  Component Value Date   PROLACTIN 18.1 (H) 09/12/2016   PROLACTIN 6.5 05/27/2016   Lab Results  Component Value Date   CHOL 118 03/13/2017   TRIG 325 (H) 03/13/2017   HDL 31 (L) 03/13/2017  CHOLHDL 3.8 03/13/2017   VLDL 65 (H) 03/13/2017   LDLCALC 22 03/13/2017   LDLCALC 37 09/12/2016   See Psychiatric Specialty Exam and Suicide Risk Assessment completed by Attending Physician prior to discharge.  Discharge destination:  Home  Is patient on multiple antipsychotic therapies at discharge:  No   Has Patient had three or more failed trials of antipsychotic monotherapy by history:  No  Recommended Plan for Multiple Antipsychotic Therapies: NA  Allergies as of 11/21/2017      Reactions   Prednisone Other (See Comments)   Hypertension, makes him feel spacey   Hydrocodone Other (See Comments)   Headache, irritable   Varenicline Other (See Comments)   Suicidal thoughts      Medication List    STOP taking these medications   chlorproMAZINE 200 MG tablet Commonly known as:  THORAZINE   clonazePAM 0.5 MG tablet Commonly known as:  KLONOPIN   doxepin 50 MG capsule Commonly known as:  SINEQUAN   escitalopram 20 MG tablet Commonly known as:  LEXAPRO   hydrOXYzine 50 MG tablet Commonly known as:  ATARAX/VISTARIL   Melatonin 5 MG Tabs   mirtazapine 45 MG tablet Commonly known as:  REMERON   OLANZapine 20 MG tablet Commonly known as:  ZYPREXA   OLANZapine 5 MG tablet Commonly known as:  ZYPREXA   prazosin 2 MG capsule Commonly known as:  MINIPRESS   QUEtiapine 300 MG tablet Commonly known as:  SEROQUEL   QUEtiapine 400 MG tablet Commonly known as:  SEROQUEL   risperiDONE 1 MG tablet Commonly known as:  RISPERDAL    temazepam 30 MG capsule Commonly known as:  RESTORIL   zolpidem 10 MG tablet Commonly known as:  AMBIEN     TAKE these medications     Indication  aspirin 81 MG EC tablet Take 1 tablet (81 mg total) by mouth daily. For heart health What changed:  additional instructions  Indication:  Heart health   atorvastatin 80 MG tablet Commonly known as:  LIPITOR Take 1 tablet (80 mg total) by mouth daily at 6 PM. For high cholesterol What changed:  additional instructions  Indication:  Inherited Heterozygous Hypercholesterolemia, High Amount of Fats in the Blood   carvedilol 6.25 MG tablet Commonly known as:  COREG Take 1 tablet (6.25 mg total) by mouth 2 (two) times daily with a meal. For high blood pressure What changed:  additional instructions  Indication:  High Blood Pressure of Unknown Cause   clopidogrel 75 MG tablet Commonly known as:  PLAVIX Take 1 tablet (75 mg total) by mouth daily. For blood clot prevention Start taking on:  11/22/2017 What changed:  additional instructions  Indication:  Disease of the Peripheral Arteries   gabapentin 300 MG capsule Commonly known as:  NEURONTIN Take 1 capsule (300 mg total) by mouth 3 (three) times daily. For agitation/neuropathic pain  Indication:  Neuropathic Pain, Agitation   lurasidone 80 MG Tabs tablet Commonly known as:  LATUDA Take 1 tablet (80 mg total) by mouth at bedtime. For mood control What changed:    when to take this  additional instructions  Another medication with the same name was removed. Continue taking this medication, and follow the directions you see here.  Indication:  Mood control   metFORMIN 500 MG tablet Commonly known as:  GLUCOPHAGE Take 1 tablet (500 mg total) by mouth 2 (two) times daily with a meal. For diabetes management What changed:  additional instructions  Indication:  Type 2 Diabetes  mupirocin cream 2 % Commonly known as:  BACTROBAN Apply topically 2 (two) times daily.   Indication:  Wound Infection   nicotine 21 mg/24hr patch Commonly known as:  NICODERM CQ - dosed in mg/24 hours Place 1 patch (21 mg total) onto the skin daily. (May buy from over the counter): For smoking cessation Start taking on:  11/22/2017  Indication:  Nicotine Addiction   nitroGLYCERIN 0.4 MG SL tablet Commonly known as:  NITROSTAT Place 1 tablet (0.4 mg total) under the tongue every 5 (five) minutes x 3 doses as needed. For chest pain What changed:  additional instructions  Indication:  Acute Angina Pectoris   Oxcarbazepine 300 MG tablet Commonly known as:  TRILEPTAL Take 1 tablet (300 mg total) by mouth at bedtime. For mood stabilization  Indication:  Mood stabilization   sulfamethoxazole-trimethoprim 800-160 MG tablet Commonly known as:  BACTRIM DS,SEPTRA DS Take 1 tablet by mouth every 12 (twelve) hours. For wound infection  Indication:  Wound infection   traZODone 100 MG tablet Commonly known as:  DESYREL Take 1 tablet (100 mg total) by mouth at bedtime as needed for sleep.  Indication:  Trouble Sleeping   TRINTELLIX 10 MG Tabs tablet Generic drug:  vortioxetine HBr Take 1 tablet (10 mg total) by mouth daily. For depression What changed:  additional instructions  Indication:  Major Depressive Disorder      Follow-up Information    [facility], [facility] Follow up on 11/28/2017.   Why:  Hospital follow-up on Wed, 9/4 at 2:45PM. Thank you.  Contact information: [address] [place] [address] [phone]          Follow-up recommendations: Activity:  As tolerated Diet: As recommended by your primary care doctor. Keep all scheduled follow-up appointments as recommended.   Comments: Patient is instructed prior to discharge to: Take all medications as prescribed by his/her mental healthcare provider. Report any adverse effects and or reactions from the medicines to his/her outpatient provider promptly. Patient has been instructed &  cautioned: To not engage in alcohol and or illegal drug use while on prescription medicines. In the event of worsening symptoms, patient is instructed to call the crisis hotline, 911 and or go to the nearest ED for appropriate evaluation and treatment of symptoms. To follow-up with his/her primary care provider for your other medical issues, concerns and or health care needs.   Signed: Lindell Spar, NP, PMHNP, FNP-BC 11/21/2017, 2:41 PM

## 2017-11-21 NOTE — Progress Notes (Signed)
Recreation Therapy Notes  Date: 8.28.19 Time: 0930 Location: 300 [address] Dayroom  Group Topic: Stress Management  Goal Area(s) Addresses:  Patient will verbalize importance of using healthy stress management.  Patient will identify positive emotions associated with healthy stress management.   Behavioral Response: Engaged  Intervention: Visual merchandiser, markers  Activity :  Patients were given a sheet of construction paper and a marker.  Patients were to trace their hands on the paper.  On the right hand, patients were to write down the things that cause them stress.  On the the left hand, they were to write all positive coping skills they use to help them deal with their stresses.  Education:  Stress Management, Discharge Planning.   Education Outcome: Acknowledges edcuation/In group clarification offered/Needs additional education  Clinical Observations/Feedback: Pt stated his stressor was bills.  Pt was engaged and attentive during group.     Victorino Sparrow, LRT/CTRS      Ria Comment, Jacole Capley A 11/21/2017 11:02 AM

## 2017-11-21 NOTE — Progress Notes (Signed)
CSW contacted pt's sister per her request to notify her of pt's scheduled discharge for today. She informed both pt and CSW that pt is not allowed back to the house per order of landlord and that they are getting evicted in 10 days. Pt provided with PART bus pass, as his sister is not willing to pick him up. He was given multiple resources including: [employer], [employer] halfway house, [employer], [employer], and Chartered certified accountant. Pt also given [employer] information if he chooses to stay in [place]. Pt has follow-up with [facility] per his request. He is aware that he may not return home due to his behavior, family request, and landlord order. Pt irritable, anxious, and pacing hall.   Breniyah Romm S. Ouida Sills, MSW, LCSW Clinical Social Worker 11/21/2017 11:50 AM

## 2017-11-21 NOTE — Progress Notes (Addendum)
Pt is new to the unit earlier today.  At the beginning of the shift, he was in bed dozing.  Writer went to his room to speak with him about his day.  He had questions about why most of his home meds were not reordered.  Writer was directed to speak to the providers tomorrow on day shift about his medication orders.  He was medicated per orders tonight along with a prn Haldol 5 mg at bedtime.  Medication education shared with patient.  Lab also called the unit to report that the abscess on pt's buttocks does have MRSA.  The evening provider was informed, and pt was started on Bactrim.  Pt contracts for safety on the unit.  He denies HI/AVH.  Support and encouragement offered.  Meds  given as ordered.  Discharge plans are in process.  Safety maintained with q15 minute checks.   0100:  Pt was back up a few minutes ago stating that he was not able to sleep.  Writer talked with evening provider who ordered Trazodone 100 mg.  Pt was given the med, but encouraged to speak to the providers during the day about his home meds that he takes for sleep.  Pt was also given reading glasses and magazines to read to hopefully help him become sleepy.  Pt is cooperative and agreeable.

## 2017-11-21 NOTE — Plan of Care (Signed)
  Problem: Education: Goal: Verbalization of understanding the information provided will improve Outcome: Adequate for Discharge   Problem: Activity: Goal: Interest or engagement in activities will improve Outcome: Adequate for Discharge   Problem: Activity: Goal: Sleeping patterns will improve Outcome: Adequate for Discharge   Problem: Coping: Goal: Ability to verbalize frustrations and anger appropriately will improve Outcome: Adequate for Discharge

## 2017-11-21 NOTE — Progress Notes (Signed)
Patient is yelling and screaming on the phone with patient's sister. Patient just approached nurse and said he is not ready to leave. MD notified and enforced decision to discharge today ASAP.

## 2017-11-22 LAB — MRSA CULTURE: Culture: DETECTED

## 2017-11-23 LAB — AEROBIC CULTURE W GRAM STAIN (SUPERFICIAL SPECIMEN): Gram Stain: NONE SEEN

## 2017-11-23 LAB — AEROBIC CULTURE  (SUPERFICIAL SPECIMEN): CULTURE: NORMAL

## 2017-11-26 DIAGNOSIS — Z5181 Encounter for therapeutic drug level monitoring: Secondary | ICD-10-CM | POA: Diagnosis not present

## 2017-11-28 ENCOUNTER — Other Ambulatory Visit: Payer: Self-pay

## 2017-11-28 NOTE — Patient Outreach (Signed)
[facility] [facility]) Care Management  11/28/2017  Franciscojavier Wronski 08-Jan-1971 [id number]   Medication Adherence call to Mr. Arlind Klingerman patients telephone number is disconnected patient is due on Metformin 500 mg and Atorvastatin 80 mg.Mr. Heffner is showing past due under [facility] ins.  [place] Management Direct Dial [phone]  Fax [phone] [email or web address]

## 2017-12-03 DIAGNOSIS — I2510 Atherosclerotic heart disease of native coronary artery without angina pectoris: Secondary | ICD-10-CM | POA: Diagnosis not present

## 2017-12-03 DIAGNOSIS — E119 Type 2 diabetes mellitus without complications: Secondary | ICD-10-CM | POA: Diagnosis not present

## 2017-12-03 DIAGNOSIS — G8929 Other chronic pain: Secondary | ICD-10-CM | POA: Diagnosis not present

## 2017-12-14 DIAGNOSIS — E1122 Type 2 diabetes mellitus with diabetic chronic kidney disease: Secondary | ICD-10-CM | POA: Diagnosis not present

## 2017-12-14 DIAGNOSIS — I252 Old myocardial infarction: Secondary | ICD-10-CM | POA: Diagnosis not present

## 2017-12-14 DIAGNOSIS — Z7984 Long term (current) use of oral hypoglycemic drugs: Secondary | ICD-10-CM | POA: Diagnosis not present

## 2017-12-14 DIAGNOSIS — I129 Hypertensive chronic kidney disease with stage 1 through stage 4 chronic kidney disease, or unspecified chronic kidney disease: Secondary | ICD-10-CM | POA: Diagnosis not present

## 2017-12-14 DIAGNOSIS — K219 Gastro-esophageal reflux disease without esophagitis: Secondary | ICD-10-CM | POA: Diagnosis not present

## 2017-12-14 DIAGNOSIS — I2510 Atherosclerotic heart disease of native coronary artery without angina pectoris: Secondary | ICD-10-CM | POA: Diagnosis not present

## 2017-12-14 DIAGNOSIS — Z79899 Other long term (current) drug therapy: Secondary | ICD-10-CM | POA: Diagnosis not present

## 2017-12-14 DIAGNOSIS — N189 Chronic kidney disease, unspecified: Secondary | ICD-10-CM | POA: Diagnosis not present

## 2017-12-14 DIAGNOSIS — R5383 Other fatigue: Secondary | ICD-10-CM | POA: Diagnosis not present

## 2017-12-26 ENCOUNTER — Other Ambulatory Visit: Payer: Self-pay

## 2017-12-26 NOTE — Patient Outreach (Signed)
[place] [facility]) Care Management  12/26/2017  Michael Schmitt 1970-06-13 [id number]   Medication Adherence call to Mr. Michael Schmitt left a message for patient to call back patient is due on Atorvastatin 80 mg and Metformin 500 mg under [facility].  [place] Management Direct Dial [phone]  Fax [phone] [email or web address]

## 2017-12-29 ENCOUNTER — Other Ambulatory Visit: Payer: Self-pay

## 2017-12-29 ENCOUNTER — Encounter: Payer: Self-pay | Admitting: Emergency Medicine

## 2017-12-29 ENCOUNTER — Emergency Department
Admission: EM | Admit: 2017-12-29 | Discharge: 2017-12-29 | Disposition: A | Discharge status: Home / Self Care | Payer: Medicare Other | Attending: Emergency Medicine | Admitting: Emergency Medicine

## 2017-12-29 DIAGNOSIS — Y9389 Activity, other specified: Secondary | ICD-10-CM | POA: Diagnosis not present

## 2017-12-29 DIAGNOSIS — S60512A Abrasion of left hand, initial encounter: Secondary | ICD-10-CM | POA: Insufficient documentation

## 2017-12-29 DIAGNOSIS — Y999 Unspecified external cause status: Secondary | ICD-10-CM | POA: Diagnosis not present

## 2017-12-29 DIAGNOSIS — Z79899 Other long term (current) drug therapy: Secondary | ICD-10-CM | POA: Insufficient documentation

## 2017-12-29 DIAGNOSIS — F329 Major depressive disorder, single episode, unspecified: Secondary | ICD-10-CM | POA: Diagnosis not present

## 2017-12-29 DIAGNOSIS — R451 Restlessness and agitation: Secondary | ICD-10-CM | POA: Diagnosis not present

## 2017-12-29 DIAGNOSIS — F639 Impulse disorder, unspecified: Secondary | ICD-10-CM | POA: Diagnosis not present

## 2017-12-29 DIAGNOSIS — Z046 Encounter for general psychiatric examination, requested by authority: Secondary | ICD-10-CM | POA: Insufficient documentation

## 2017-12-29 DIAGNOSIS — I2510 Atherosclerotic heart disease of native coronary artery without angina pectoris: Secondary | ICD-10-CM | POA: Diagnosis not present

## 2017-12-29 DIAGNOSIS — S6992XA Unspecified injury of left wrist, hand and finger(s), initial encounter: Secondary | ICD-10-CM | POA: Diagnosis present

## 2017-12-29 DIAGNOSIS — W2209XA Striking against other stationary object, initial encounter: Secondary | ICD-10-CM | POA: Insufficient documentation

## 2017-12-29 DIAGNOSIS — J45909 Unspecified asthma, uncomplicated: Secondary | ICD-10-CM | POA: Insufficient documentation

## 2017-12-29 DIAGNOSIS — F17210 Nicotine dependence, cigarettes, uncomplicated: Secondary | ICD-10-CM | POA: Insufficient documentation

## 2017-12-29 DIAGNOSIS — I10 Essential (primary) hypertension: Secondary | ICD-10-CM | POA: Diagnosis not present

## 2017-12-29 DIAGNOSIS — Z7984 Long term (current) use of oral hypoglycemic drugs: Secondary | ICD-10-CM | POA: Insufficient documentation

## 2017-12-29 DIAGNOSIS — E119 Type 2 diabetes mellitus without complications: Secondary | ICD-10-CM | POA: Diagnosis not present

## 2017-12-29 DIAGNOSIS — Y929 Unspecified place or not applicable: Secondary | ICD-10-CM | POA: Diagnosis not present

## 2017-12-29 DIAGNOSIS — Z7982 Long term (current) use of aspirin: Secondary | ICD-10-CM | POA: Insufficient documentation

## 2017-12-29 DIAGNOSIS — Z7289 Other problems related to lifestyle: Secondary | ICD-10-CM | POA: Insufficient documentation

## 2017-12-29 LAB — COMPREHENSIVE METABOLIC PANEL
ALBUMIN: 3.8 g/dL (ref 3.5–5.0)
ALT: 72 U/L — ABNORMAL HIGH (ref 0–44)
AST: 43 U/L — AB (ref 15–41)
Alkaline Phosphatase: 107 U/L (ref 38–126)
Anion gap: 11 (ref 5–15)
BILIRUBIN TOTAL: 0.6 mg/dL (ref 0.3–1.2)
BUN: 8 mg/dL (ref 6–20)
CHLORIDE: 105 mmol/L (ref 98–111)
CO2: 25 mmol/L (ref 22–32)
Calcium: 9.1 mg/dL (ref 8.9–10.3)
Creatinine, Ser: 0.62 mg/dL (ref 0.61–1.24)
GFR calc Af Amer: >60 mL/min (ref >60)
GFR calc non Af Amer: >60 mL/min (ref >60)
GLUCOSE: 185 mg/dL — AB (ref 70–99)
Potassium: 4 mmol/L (ref 3.5–5.1)
Sodium: 141 mmol/L (ref 135–145)
Total Protein: 7.4 g/dL (ref 6.5–8.1)

## 2017-12-29 LAB — URINE DRUG SCREEN, QUALITATIVE (ARMC ONLY)
AMPHETAMINES, UR SCREEN: NOT DETECTED
Barbiturates, Ur Screen: NOT DETECTED
Benzodiazepine, Ur Scrn: POSITIVE — AB
Cannabinoid 50 Ng, Ur ~~LOC~~: NOT DETECTED
Cocaine Metabolite,Ur ~~LOC~~: NOT DETECTED
MDMA (ECSTASY) UR SCREEN: NOT DETECTED
Methadone Scn, Ur: NOT DETECTED
Opiate, Ur Screen: NOT DETECTED
PHENCYCLIDINE (PCP) UR S: NOT DETECTED
Tricyclic, Ur Screen: POSITIVE — AB

## 2017-12-29 LAB — ETHANOL: Alcohol, Ethyl (B): <10 mg/dL (ref <10)

## 2017-12-29 LAB — CBC
HEMATOCRIT: 42.6 % (ref 40.0–52.0)
Hemoglobin: 14.1 g/dL (ref 13.0–18.0)
MCH: 29.5 pg (ref 26.0–34.0)
MCHC: 33.2 g/dL (ref 32.0–36.0)
MCV: 88.7 fL (ref 80.0–100.0)
Platelets: 190 10*3/uL (ref 150–440)
RBC: 4.8 MIL/uL (ref 4.40–5.90)
RDW: 14.5 % (ref 11.5–14.5)
WBC: 7.3 10*3/uL (ref 3.8–10.6)

## 2017-12-29 LAB — SALICYLATE LEVEL: Salicylate Lvl: <7 mg/dL (ref 2.8–30.0)

## 2017-12-29 LAB — ACETAMINOPHEN LEVEL: Acetaminophen (Tylenol), Serum: <10 ug/mL — ABNORMAL LOW (ref 10–30)

## 2017-12-29 NOTE — ED Notes (Signed)
Called [facility] for consult  [phone]

## 2017-12-29 NOTE — ED Notes (Signed)
BEHAVIORAL HEALTH ROUNDING Patient sleeping: Yes.   Patient alert and oriented: eyes closed  Appears to be asleep Behavior appropriate: Yes.  ; If no, describe:  Nutrition and fluids offered: Yes  Toileting and hygiene offered: sleeping Sitter present: q 15 minute observations and security monitoring Law enforcement present: yes  ODS 

## 2017-12-29 NOTE — Discharge Instructions (Signed)
Return to the emergency room immediately for any worsening condition including confusion altered mental status, thoughts of wanting to hurt yourself or others, hallucinations, violent behavior, or any other symptoms concerning to you.

## 2017-12-29 NOTE — ED Notes (Signed)

## 2017-12-29 NOTE — ED Provider Notes (Signed)
[facility] Emergency Department Provider Note ____________________________________________   I have reviewed the triage vital signs and the triage nursing note.  HISTORY  Chief Complaint No chief complaint on file. Agitation  Historian Patient  HPI Michael Schmitt is a 47 y.o. male presents from home where he had been discharged from psychiatric admission at [facility] yesterday, went home with his sister.  States that he became agitated that they were supposed to go the grocery store together and she was taking longer and so he punched the wall.  He has a small abrasion over his left fourth knuckle, but no bony tenderness.  Patient states he was also yelling.  He states that his sister wanted him to come over for evaluation     Past Medical History:  Diagnosis Date  . Anxiety   . Anxiety   . Arthritis    cerv. stenosis, spondylosis, HNP- lower back , has been followed in pain clinic, has  had injection s in cerv. area  . Blood dyscrasia    told that when he was young he was a" free bleeder"  . CAD (coronary artery disease)   . Cervical spondylosis without myelopathy 07/24/2014  . Cervicogenic headache 07/24/2014  . Chronic kidney disease    renal calculi- passed spontaneously  . Depression   . Diabetes mellitus without complication ([place])   . Fatty liver   . GERD (gastroesophageal reflux disease)   . Headache(784.0)   . Hyperlipidemia   . Hypertension   . Mental disorder   . MI, old   . RLS (restless legs syndrome)    detected on sleep study  . Shortness of breath   . Ventricular fibrillation ([place]) 04/05/2016    Patient Active Problem List   Diagnosis Date Noted  . MDD (major depressive disorder), recurrent episode, severe ([place]) 11/20/2017  . Dysthymia 10/26/2017  . Severe recurrent major depression without psychotic features ([place]) 03/12/2017  . Generalized anxiety disorder 02/05/2017  . Chronic neck pain (Primary Area of Pain) (Left)  11/22/2016  . Cervical facet syndrome (Left) 11/22/2016  . Cervical radiculitis (Left) 11/22/2016  . History of cervical spinal surgery 11/22/2016  . Cervical spondylosis 11/22/2016  . Chronic shoulder pain (Secondary Area of Pain) (Left) 11/22/2016  . Arthropathy of shoulder (Left) 11/22/2016  . Chronic low back pain [facility] Area of Pain) (Bilateral) (L>R) 11/22/2016  . Lumbar facet syndrome (Bilateral) (L>R) 11/22/2016  . Chronic pain syndrome 11/21/2016  . Long term prescription benzodiazepine use 11/21/2016  . Opiate use 11/21/2016  . Uncontrolled type 2 diabetes mellitus without complication, without long-term current use of insulin ([place]) 10/19/2016  . Chronic Suicidal ideation 09/15/2016  . Coronary artery disease 09/12/2016  . HTN (hypertension) 06/26/2016  . GERD (gastroesophageal reflux disease) 06/26/2016  . Alcohol use disorder, severe, 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[place]) 06/26/2016  . Moderate benzodiazepine use disorder ([place]) 06/11/2016  . Sedative, hypnotic or anxiolytic use disorder, severe, dependence ([place]) 05/12/2016  . Ventricular fibrillation ([place]) 04/05/2016  . Combined hyperlipidemia 01/05/2016  . Cervical spondylosis without myelopathy 07/24/2014  . Severe episode of recurrent major depressive disorder, without psychotic features ([place]) 04/15/2013  . Tobacco use disorder 10/20/2010  . Asthma 10/20/2010    Past Surgical History:  Procedure Laterality Date  . ANTERIOR CERVICAL DECOMP/DISCECTOMY FUSION  11/13/2011   Procedure: ANTERIOR CERVICAL DECOMPRESSION/DISCECTOMY FUSION 1 LEVEL;  Surgeon: [name], MD;  Location: [place] NEURO ORS;  Service: Neurosurgery;  Laterality: N/A;  Anterior Cervical Decompression/discectomy Fusion. Cervical three-four.  Marland Kitchen  CARDIAC CATHETERIZATION N/A 01/01/2015   Procedure: Left Heart Cath and Coronary Angiography;  Surgeon: [name], MD;  Location: [place] CV LAB;  Service: Cardiovascular;  Laterality: N/A;  . CARDIAC CATHETERIZATION N/A  04/05/2016   Procedure: Left Heart Cath and Coronary Angiography;  Surgeon: Belva Crome, MD;  Location: [place] CV LAB;  Service: Cardiovascular;  Laterality: N/A;  . CARDIAC CATHETERIZATION N/A 04/05/2016   Procedure: Coronary Stent Intervention;  Surgeon: Belva Crome, MD;  Location: [place] CV LAB;  Service: Cardiovascular;  Laterality: N/A;  . CARDIAC CATHETERIZATION N/A 04/05/2016   Procedure: Intravascular Ultrasound/IVUS;  Surgeon: Belva Crome, MD;  Location: [place] CV LAB;  Service: Cardiovascular;  Laterality: N/A;  . NASAL SINUS SURGERY     2005    Prior to Admission medications   Medication Sig Start Date End Date Taking? Authorizing Provider  carbamazepine (TEGRETOL) 200 MG tablet Take 200 mg by mouth 2 (two) times daily.  12/28/17  Yes [provider]  chlorproMAZINE (THORAZINE) 200 MG tablet Take 400 mg by mouth at bedtime.  12/13/17  Yes [provider]  clonazePAM (KLONOPIN) 1 MG tablet Take 1 mg by mouth 3 (three) times daily as needed.  12/28/17  Yes [provider]  divalproex (DEPAKOTE) 500 MG DR tablet Take 500 mg by mouth 2 (two) times daily.  12/05/17  Yes [provider]  doxepin (SINEQUAN) 50 MG capsule Take 50 mg by mouth at bedtime.  12/12/17  Yes [provider]  escitalopram (LEXAPRO) 10 MG tablet Take 10 mg by mouth daily.  11/30/17  Yes [provider]  gabapentin (NEURONTIN) 600 MG tablet Take 600 mg by mouth 3 (three) times daily.  12/28/17  Yes [provider]  Melatonin 5 MG TABS Take 1 tablet by mouth at bedtime.   Yes [provider]  nitroGLYCERIN (NITROSTAT) 0.4 MG SL tablet Place 1 tablet (0.4 mg total) under the tongue every 5 (five) minutes x 3 doses as needed. For chest pain 11/21/17 11/21/18 Yes Lindell Spar I, NP  prazosin (MINIPRESS) 2 MG capsule Take 2 mg by mouth at bedtime.  12/12/17  Yes [provider]  QUEtiapine (SEROQUEL) 300 MG tablet Take 600 mg by mouth  at bedtime.  11/30/17  Yes [provider]  traZODone (DESYREL) 100 MG tablet Take 1 tablet (100 mg total) by mouth at bedtime as needed for sleep. 11/21/17  Yes Nwoko, Herbert Pun I, NP  TRINTELLIX 10 MG TABS tablet Take 1 tablet (10 mg total) by mouth daily. For depression 11/21/17  Yes Lindell Spar I, NP  aspirin 81 MG EC tablet Take 1 tablet (81 mg total) by mouth daily. For heart health 11/21/17   Lindell Spar I, NP  atorvastatin (LIPITOR) 80 MG tablet Take 1 tablet (80 mg total) by mouth daily at 6 PM. For high cholesterol 11/21/17   Lindell Spar I, NP  carvedilol (COREG) 6.25 MG tablet Take 1 tablet (6.25 mg total) by mouth 2 (two) times daily with a meal. For high blood pressure 11/21/17   Lindell Spar I, NP  clopidogrel (PLAVIX) 75 MG tablet Take 1 tablet (75 mg total) by mouth daily. For blood clot prevention 11/22/17   Lindell Spar I, NP  gabapentin (NEURONTIN) 300 MG capsule Take 1 capsule (300 mg total) by mouth 3 (three) times daily. For agitation/neuropathic pain Patient not taking: Reported on 12/29/2017 11/21/17   Lindell Spar I, NP  lurasidone (LATUDA) 80 MG TABS tablet Take 1  tablet (80 mg total) by mouth at bedtime. For mood control 11/21/17   Lindell Spar I, NP  metFORMIN (GLUCOPHAGE) 500 MG tablet Take 1 tablet (500 mg total) by mouth 2 (two) times daily with a meal. For diabetes management 11/21/17   Lindell Spar I, NP  mupirocin cream (BACTROBAN) 2 % Apply topically 2 (two) times daily. 11/21/17   Lindell Spar I, NP  nicotine (NICODERM CQ - DOSED IN MG/24 HOURS) 21 mg/24hr patch Place 1 patch (21 mg total) onto the skin daily. (May buy from over the counter): For smoking cessation 11/22/17   Lindell Spar I, NP  Oxcarbazepine (TRILEPTAL) 300 MG tablet Take 1 tablet (300 mg total) by mouth at bedtime. For mood stabilization 11/21/17   Lindell Spar I, NP  sulfamethoxazole-trimethoprim (BACTRIM DS,SEPTRA DS) 800-160 MG tablet Take 1 tablet by mouth every 12 (twelve) hours. For wound  infection Patient not taking: Reported on 12/29/2017 11/21/17   Lindell Spar I, NP    Allergies  Allergen Reactions  . Prednisone Other (See Comments)    Hypertension, makes him feel spacey  . Hydrocodone Other (See Comments)    Headache, irritable  . Varenicline Other (See Comments)    Suicidal thoughts    Family History  Problem Relation Age of Onset  . Prostate cancer Father   . Hypertension Mother   . Kidney Stones Mother   . Anxiety disorder Mother   . Depression Mother   . COPD Sister   . Hypertension Sister   . Diabetes Sister   . Depression Sister   . Anxiety disorder Sister   . Seizures Sister   . ADD / ADHD Son   . ADD / ADHD Daughter     Social History Social History   Tobacco Use  . Smoking status: Current Every Day Smoker    Packs/day: 2.00    Years: 17.00    Pack years: 34.00    Types: Cigarettes, Cigars  . Smokeless tobacco: Former Systems developer  . Tobacco comment: occassional snuff   Substance Use Topics  . Alcohol use: No    Alcohol/week: 0.0 standard drinks    Comment: last drinked in 3 months.   . Drug use: No    Review of Systems  Constitutional: Negative for fever. Eyes: Negative for visual changes. ENT: Negative for sore throat.  Cardiovascular: Negative for chest pain. Respiratory: Negative for shortness of breath. Gastrointestinal: Negative for abdominal pain, vomiting and diarrhea. Genitourinary: Negative for dysuria. Musculoskeletal: Negative for back pain.   Skin: Negative for rash.  Abrasion left hand. Neurological: Negative for headache.  ____________________________________________   PHYSICAL EXAM:  VITAL SIGNS: ED Triage Vitals  Enc Vitals Group     BP 12/29/17 1028 119/83     Pulse Rate 12/29/17 1028 79     Resp 12/29/17 1028 18     Temp 12/29/17 1028 97.8 F (36.6 C)     Temp Source 12/29/17 1028 Oral     SpO2 12/29/17 1028 95 %     Weight 12/29/17 1025 210 lb (95.3 kg)     Height 12/29/17 1025 6\' 2"  (1.88 m)      Head Circumference --      Peak Flow --      Pain Score 12/29/17 1024 7     Pain Loc --      Pain Edu? --      Excl. in [place]? --      Constitutional: Alert.  HEENT      Head: Normocephalic  and atraumatic.      Eyes: Conjunctivae are normal. Pupils equal and round.       Ears:         Nose: No congestion/rhinnorhea.      Mouth/Throat: Mucous membranes are moist.      Neck: No stridor. Cardiovascular/Chest: Normal rate, regular rhythm.  No murmurs, rubs, or gallops. Respiratory: Normal respiratory effort without tachypnea nor retractions. Breath sounds are clear and equal bilaterally. No wheezes/rales/rhonchi. Gastrointestinal: Soft. No distention, no guarding, no rebound. Nontender.    Genitourinary/rectal:Deferred Musculoskeletal: Abrasion over 3rd knuckle of left hand.  No bony point tenderness, full range of motion at joints. Neurologic:  No facial droop.  Mild slurring of speech, able to clear up when asked about it.   No gross or focal neurologic deficits are appreciated. Skin:  Skin is warm, dry.  Abrasion left hand.  No rash noted. Psychiatric: Gets little agitated but is verbally redirectable.  Denies suicidal or homicidal ideation.   ____________________________________________  LABS (pertinent positives/negatives) I, Lisa Roca, MD the attending physician have reviewed the labs noted below.  Labs Reviewed  COMPREHENSIVE METABOLIC PANEL - Abnormal; Notable for the following components:      Result Value   Glucose, Bld 185 (*)    AST 43 (*)    ALT 72 (*)    All other components within normal limits  ACETAMINOPHEN LEVEL - Abnormal; Notable for the following components:   Acetaminophen (Tylenol), Serum <10 (*)    All other components within normal limits  URINE DRUG SCREEN, QUALITATIVE ([facility] ONLY) - Abnormal; Notable for the following components:   Tricyclic, Ur Screen POSITIVE (*)    Benzodiazepine, Ur Scrn POSITIVE (*)    All other components within normal limits   ETHANOL  SALICYLATE LEVEL  CBC    ____________________________________________    EKG I, Lisa Roca, MD, the attending physician have personally viewed and interpreted all ECGs.  None ____________________________________________  RADIOLOGY   None __________________________________________  PROCEDURES  Procedure(s) performed: None  Procedures  Critical Care performed: None   ____________________________________________  ED COURSE / ASSESSMENT AND PLAN  Pertinent labs & imaging results that were available during my care of the patient were reviewed by me and considered in my medical decision making (see chart for details).    Patient here for outburst and punching a wall.  From a trauma perspective, I am not suspicious of any underlying bony injury as he has full range of motion and no real tenderness there.  In terms of his agitation he does have impulse control issues, he was recently released from psychiatric hospitalization.  He is not having hallucinations or suicidal homicidal ideation, however I did request psychiatric consultation given the outburst.  Only other thing of note the patient seems to have a little bit of slurred speech patient tells me it is because of side effects Thorazine.  I do not see that on his medication list, we tried to get in touch with [name] to find out if this was in fact a new medication or if the patient was not accurate in this, and this was unsuccessful.  There is no reported recent head injuries.  He does have a history of banging his head in the past and wonder if some of his impulse issues may be related to frontal lobe injury as a result of multiple concussions.  I will go ahead and give the number for neurology follow-up as a consideration.     I was able  to get a copy of his discharge medication list from recent hospitalization and he is on new medication Thorazine at night and this may be contributing to his slurred  speech axis deviation.  He is also on Klonopin 3 times daily and his urine drug screen was just positive for benzo days pains.  In any case, not concern for acute stroke or new head injury.  I have asked him to follow-up with his psychiatrist.    CONSULTATIONS:   Tele-psychiatry, I spoke with Dr. Christophe Louis, recommends outpatient follow up, no indication for emergency psychiatric hospitalization.   Patient / Family / Caregiver informed of clinical course, medical decision-making process, and agree with plan.   I discussed return precautions, follow-up instructions, and discharge instructions with patient and/or family.  Discharge Instructions : Return to the emergency room immediately for any worsening condition including confusion altered mental status, thoughts of wanting to hurt yourself or others, hallucinations, violent behavior, or any other symptoms concerning to you.    ___________________________________________   FINAL CLINICAL IMPRESSION(S) / ED DIAGNOSES   Final diagnoses:  Abrasion of left hand, initial encounter  Agitation      ___________________________________________         Note: This dictation was prepared with Dragon dictation. Any transcriptional errors that result from this process are unintentional    Lisa Roca, MD 12/29/17 [phone]

## 2017-12-29 NOTE — ED Triage Notes (Signed)
"  I am angry, punching walls".  Abrasion noted to left hand. [employer] voluntary.  Pt [facility] to [facility] to [facility].  When asked why so angry pt states "I am bipolar and I think I have adhd".  Slurred speech in triage but per [employer] sister told them it was normal.  SI without plan.  When asked about HI pt states "yes" but has not one specific he wants to hurt.  Pt was hitting head with hands in lobby.

## 2017-12-29 NOTE — ED Notes (Signed)
BEHAVIORAL HEALTH ROUNDING Patient sleeping: No. Patient alert and oriented: yes Behavior appropriate:   Angry - mad   ; If no, describe:  Nutrition and fluids offered: yes Toileting and hygiene offered: Yes  Sitter present: q15 minute observations and security monitoring Law enforcement present: Yes  ODS

## 2017-12-29 NOTE — BH Assessment (Signed)
Tele Assessment Note   Patient Name: [name] MRN: [id number] Referring Physician:  Location of Patient:  Location of Provider: [place] is an 47 y.o. male.  Patient was brought in by [employer] as they were called due to anger outbursts at the home; Pt would not provide details regarding why he had gotten angry; Pt continued to state he was angry and hit the door; Pt was recently released from [facility] and is stating he would like to go back; Pt denies any current SI, but admitted to [facility] within the past; Pt does not have an intent to harm himself currently; Pt denied HI; Pt denied A/V hallucinations; Pt denied delusions; pt denied abusing drugs/alcohol; pt had slurred speech and was irritable; Pt lives with his mother and sister; Pt is currently engaged in services with [facility]; pt has several pending charges related to DWI and assault on male; pt admitted to feelings of depression;  Pt stated he has ADHD and no one will treat him for it; pt states his kids have ADHD and he needs to be diagnosed   Diagnosis:  Axis I: Mood Disorder Axis II: deferred Axis III: See medical Notes Axis IV: legal issues; access to mental health; employment   Past Medical History:  Past Medical History:  Diagnosis Date  . Anxiety   . Anxiety   . Arthritis    cerv. stenosis, spondylosis, HNP- lower back , has been followed in pain clinic, has  had injection s in cerv. area  . Blood dyscrasia    told that when he was young he was a" free bleeder"  . CAD (coronary artery disease)   . Cervical spondylosis without myelopathy 07/24/2014  . Cervicogenic headache 07/24/2014  . Chronic kidney disease    renal calculi- passed spontaneously  . Depression   . Diabetes mellitus without complication ([place])   . Fatty liver   . GERD (gastroesophageal reflux disease)   . Headache(784.0)   . Hyperlipidemia   . Hypertension   . Mental disorder   . MI, old   . RLS  (restless legs syndrome)    detected on sleep study  . Shortness of breath   . Ventricular fibrillation ([place]) 04/05/2016    Past Surgical History:  Procedure Laterality Date  . ANTERIOR CERVICAL DECOMP/DISCECTOMY FUSION  11/13/2011   Procedure: ANTERIOR CERVICAL DECOMPRESSION/DISCECTOMY FUSION 1 LEVEL;  Surgeon: [name], MD;  Location: [place] NEURO ORS;  Service: Neurosurgery;  Laterality: N/A;  Anterior Cervical Decompression/discectomy Fusion. Cervical three-four.  Marland Kitchen CARDIAC CATHETERIZATION N/A 01/01/2015   Procedure: Left Heart Cath and Coronary Angiography;  Surgeon: [name], MD;  Location: [place] CV LAB;  Service: Cardiovascular;  Laterality: N/A;  . CARDIAC CATHETERIZATION N/A 04/05/2016   Procedure: Left Heart Cath and Coronary Angiography;  Surgeon: Belva Crome, MD;  Location: [place] CV LAB;  Service: Cardiovascular;  Laterality: N/A;  . CARDIAC CATHETERIZATION N/A 04/05/2016   Procedure: Coronary Stent Intervention;  Surgeon: Belva Crome, MD;  Location: [place] CV LAB;  Service: Cardiovascular;  Laterality: N/A;  . CARDIAC CATHETERIZATION N/A 04/05/2016   Procedure: Intravascular Ultrasound/IVUS;  Surgeon: Belva Crome, MD;  Location: [place] CV LAB;  Service: Cardiovascular;  Laterality: N/A;  . NASAL SINUS SURGERY     2005    Family History:  Family History  Problem Relation Age of Onset  . Prostate cancer Father   . Hypertension Mother   . Kidney Stones  Mother   . Anxiety disorder Mother   . Depression Mother   . COPD Sister   . Hypertension Sister   . Diabetes Sister   . Depression Sister   . Anxiety disorder Sister   . Seizures Sister   . ADD / ADHD Son   . ADD / ADHD Daughter     Social History:  reports that he has been smoking cigarettes and cigars. He has a 34.00 pack-year smoking history. He has quit using smokeless tobacco. He reports that he does not drink alcohol or use drugs.  Additional Social History:     CIWA: CIWA-Ar BP:  119/83 Pulse Rate: 79 COWS:    Allergies:  Allergies  Allergen Reactions  . Prednisone Other (See Comments)    Hypertension, makes him feel spacey  . Hydrocodone Other (See Comments)    Headache, irritable  . Varenicline Other (See Comments)    Suicidal thoughts    Home Medications:  (Not in a hospital admission)  OB/GYN Status:  No LMP for male patient.  General Assessment Data Location of Assessment: [facility] ED TTS Assessment: In system Is this a Tele or Face-to-Face Assessment?: Face-to-Face Is this an Initial Assessment or a Re-assessment for this encounter?: Initial Assessment Patient Accompanied by:: N/A Living Arrangements: Other (Comment)(lives with mom and sister) Marital status: Widowed Living Arrangements: Parent, Other relatives Can pt return to current living arrangement?: Yes Admission Status: (P) Voluntary  Medical Screening Exam (Mobile) Medical Exam completed: (P) Yes  Crisis Care Plan Living Arrangements: Parent, Other relatives     Risk to self with the past 6 months Suicidal Ideation: (P) No Has patient been a risk to self within the past 6 months prior to admission? : (P) Yes Suicidal Intent: (P) No Has patient had any suicidal intent within the past 6 months prior to admission? : (P) Yes Is patient at risk for suicide?: (P) Yes Suicidal Plan?: (P) No Has patient had any suicidal plan within the past 6 months prior to admission? : (P) Yes Specify Current Suicidal Plan: (P) none indicated Access to Means: (P) Yes(household items and medications) Specify Access to Suicidal Means: (P) household items and medications What has been your use of drugs/alcohol within the last 12 months?: (P) patient denied Previous Attempts/Gestures: (P) Yes How many times?: (P) 3 Other Self Harm Risks: (P) anger Triggers for Past Attempts: (P) Unknown, Unpredictable Intentional Self Injurious Behavior: (P) Damaging, Bruising(pt was hitting himself in  lobby) Family Suicide History: (P) No Recent stressful life event(s): (P) Other (Comment)(patient did not indicate anything specific) Persecutory voices/beliefs?: (P) No Depression: (P) Yes Depression Symptoms: (P) Feeling angry/irritable Substance abuse history and/or treatment for substance abuse?: (P) No(denied) Suicide prevention information given to non-admitted patients: (P) Not applicable  Risk to Others within the past 6 months Homicidal Ideation: (P) No Does patient have any lifetime risk of violence toward others beyond the six months prior to admission? : (P) No Thoughts of Harm to Others: (P) No Current Homicidal Intent: (P) No Current Homicidal Plan: (P) No Access to Homicidal Means: (P) Yes(household items)     Mental Status Report Motor Activity: Freedom of movement                            Advance Directives (For Healthcare) Does Patient Have a Medical Advance Directive?: No          Disposition:       Adriana Lina  [place] 12/29/2017 12:53 PM

## 2017-12-30 ENCOUNTER — Encounter: Payer: Self-pay | Admitting: Emergency Medicine

## 2017-12-30 ENCOUNTER — Other Ambulatory Visit: Payer: Self-pay

## 2017-12-30 ENCOUNTER — Emergency Department
Admission: EM | Admit: 2017-12-30 | Discharge: 2017-12-31 | Disposition: A | Discharge status: Home / Self Care | Payer: Medicare Other | Attending: Emergency Medicine | Admitting: Emergency Medicine

## 2017-12-30 DIAGNOSIS — F17228 Nicotine dependence, chewing tobacco, with other nicotine-induced disorders: Secondary | ICD-10-CM | POA: Insufficient documentation

## 2017-12-30 DIAGNOSIS — Z7984 Long term (current) use of oral hypoglycemic drugs: Secondary | ICD-10-CM | POA: Diagnosis not present

## 2017-12-30 DIAGNOSIS — Z7902 Long term (current) use of antithrombotics/antiplatelets: Secondary | ICD-10-CM | POA: Diagnosis not present

## 2017-12-30 DIAGNOSIS — Z7982 Long term (current) use of aspirin: Secondary | ICD-10-CM | POA: Insufficient documentation

## 2017-12-30 DIAGNOSIS — Z79899 Other long term (current) drug therapy: Secondary | ICD-10-CM | POA: Diagnosis not present

## 2017-12-30 DIAGNOSIS — F6381 Intermittent explosive disorder: Secondary | ICD-10-CM | POA: Insufficient documentation

## 2017-12-30 DIAGNOSIS — F39 Unspecified mood [affective] disorder: Secondary | ICD-10-CM | POA: Diagnosis not present

## 2017-12-30 DIAGNOSIS — F918 Other conduct disorders: Secondary | ICD-10-CM | POA: Diagnosis present

## 2017-12-30 DIAGNOSIS — F17210 Nicotine dependence, cigarettes, uncomplicated: Secondary | ICD-10-CM | POA: Insufficient documentation

## 2017-12-30 DIAGNOSIS — I2510 Atherosclerotic heart disease of native coronary artery without angina pectoris: Secondary | ICD-10-CM | POA: Insufficient documentation

## 2017-12-30 DIAGNOSIS — F1310 Sedative, hypnotic or anxiolytic abuse, uncomplicated: Secondary | ICD-10-CM | POA: Diagnosis not present

## 2017-12-30 DIAGNOSIS — E1122 Type 2 diabetes mellitus with diabetic chronic kidney disease: Secondary | ICD-10-CM | POA: Insufficient documentation

## 2017-12-30 DIAGNOSIS — F609 Personality disorder, unspecified: Secondary | ICD-10-CM

## 2017-12-30 DIAGNOSIS — R45851 Suicidal ideations: Secondary | ICD-10-CM

## 2017-12-30 DIAGNOSIS — F17290 Nicotine dependence, other tobacco product, uncomplicated: Secondary | ICD-10-CM | POA: Diagnosis not present

## 2017-12-30 DIAGNOSIS — N189 Chronic kidney disease, unspecified: Secondary | ICD-10-CM | POA: Insufficient documentation

## 2017-12-30 DIAGNOSIS — I129 Hypertensive chronic kidney disease with stage 1 through stage 4 chronic kidney disease, or unspecified chronic kidney disease: Secondary | ICD-10-CM | POA: Insufficient documentation

## 2017-12-30 DIAGNOSIS — F411 Generalized anxiety disorder: Secondary | ICD-10-CM | POA: Diagnosis present

## 2017-12-30 LAB — CBC
HCT: 42.8 % (ref 40.0–52.0)
Hemoglobin: 14.1 g/dL (ref 13.0–18.0)
MCH: 29.5 pg (ref 26.0–34.0)
MCHC: 33 g/dL (ref 32.0–36.0)
MCV: 89.4 fL (ref 80.0–100.0)
PLATELETS: 189 10*3/uL (ref 150–440)
RBC: 4.79 MIL/uL (ref 4.40–5.90)
RDW: 14.9 % — AB (ref 11.5–14.5)
WBC: 8 10*3/uL (ref 3.8–10.6)

## 2017-12-30 LAB — COMPREHENSIVE METABOLIC PANEL
ALBUMIN: 4.1 g/dL (ref 3.5–5.0)
ALT: 63 U/L — ABNORMAL HIGH (ref 0–44)
ANION GAP: 15 (ref 5–15)
AST: 40 U/L (ref 15–41)
Alkaline Phosphatase: 103 U/L (ref 38–126)
BILIRUBIN TOTAL: 0.6 mg/dL (ref 0.3–1.2)
BUN: 9 mg/dL (ref 6–20)
CHLORIDE: 101 mmol/L (ref 98–111)
CO2: 22 mmol/L (ref 22–32)
Calcium: 9.2 mg/dL (ref 8.9–10.3)
Creatinine, Ser: 0.87 mg/dL (ref 0.61–1.24)
GFR calc Af Amer: >60 mL/min (ref >60)
GFR calc non Af Amer: >60 mL/min (ref >60)
GLUCOSE: 284 mg/dL — AB (ref 70–99)
POTASSIUM: 4 mmol/L (ref 3.5–5.1)
SODIUM: 138 mmol/L (ref 135–145)
TOTAL PROTEIN: 7 g/dL (ref 6.5–8.1)

## 2017-12-30 LAB — URINE DRUG SCREEN, QUALITATIVE (ARMC ONLY)
AMPHETAMINES, UR SCREEN: NOT DETECTED
BENZODIAZEPINE, UR SCRN: POSITIVE — AB
Barbiturates, Ur Screen: NOT DETECTED
Cannabinoid 50 Ng, Ur ~~LOC~~: NOT DETECTED
Cocaine Metabolite,Ur ~~LOC~~: NOT DETECTED
MDMA (ECSTASY) UR SCREEN: NOT DETECTED
METHADONE SCREEN, URINE: NOT DETECTED
Opiate, Ur Screen: NOT DETECTED
Phencyclidine (PCP) Ur S: NOT DETECTED
Tricyclic, Ur Screen: POSITIVE — AB

## 2017-12-30 LAB — ETHANOL: Alcohol, Ethyl (B): <10 mg/dL (ref <10)

## 2017-12-30 LAB — SALICYLATE LEVEL: Salicylate Lvl: <7 mg/dL (ref 2.8–30.0)

## 2017-12-30 LAB — ACETAMINOPHEN LEVEL

## 2017-12-30 MED ORDER — LORAZEPAM 1 MG PO TABS
1.0000 mg | ORAL_TABLET | Freq: Once | ORAL | Status: AC
  Administered 2017-12-30: 1 mg via ORAL
  Filled 2017-12-30: qty 1

## 2017-12-30 MED ORDER — CARVEDILOL 6.25 MG PO TABS
6.2500 mg | ORAL_TABLET | Freq: Two times a day (BID) | ORAL | Status: DC
  Administered 2017-12-30 – 2017-12-31 (×2): 6.25 mg via ORAL
  Filled 2017-12-30 (×4): qty 1

## 2017-12-30 MED ORDER — ASPIRIN EC 81 MG PO TBEC
81.0000 mg | DELAYED_RELEASE_TABLET | Freq: Every day | ORAL | Status: DC
  Administered 2017-12-30 – 2017-12-31 (×2): 81 mg via ORAL
  Filled 2017-12-30 (×2): qty 1

## 2017-12-30 MED ORDER — QUETIAPINE FUMARATE 200 MG PO TABS
200.0000 mg | ORAL_TABLET | ORAL | Status: AC
  Administered 2017-12-30: 200 mg via ORAL
  Filled 2017-12-30: qty 1

## 2017-12-30 MED ORDER — CLOPIDOGREL BISULFATE 75 MG PO TABS
75.0000 mg | ORAL_TABLET | Freq: Every day | ORAL | Status: DC
  Administered 2017-12-30 – 2017-12-31 (×2): 75 mg via ORAL
  Filled 2017-12-30 (×3): qty 1

## 2017-12-30 MED ORDER — METFORMIN HCL 500 MG PO TABS
500.0000 mg | ORAL_TABLET | Freq: Two times a day (BID) | ORAL | Status: DC
  Administered 2017-12-30 – 2017-12-31 (×2): 500 mg via ORAL
  Filled 2017-12-30 (×3): qty 1

## 2017-12-30 NOTE — ED Notes (Signed)
Hourly rounding reveals patient sleeping in room. No complaints, stable, in no acute distress. Q15 minute rounds and monitoring via Security Cameras to continue. 

## 2017-12-30 NOTE — Progress Notes (Signed)
LCSW provided emotional support after he yelled out. Patient was given verbal re-assurance he is safe and fine. ED RN disclosed she would give the patient his medications. Patient agreed to remain calm in his room.  [employer] LCSW [phone]

## 2017-12-30 NOTE — ED Notes (Signed)
19H

## 2017-12-30 NOTE — ED Notes (Addendum)
Upon arrival to unit, pt said in a garbled voice, "I need some help ... Y'all don't understand. I want to stop feeling this this way ... I need ADHD medicine ... I feel like I want to hit something." Pt then made punching gestures. After this writer left the room, he threw the remote at the wall.   Pt was given scheduled medications, which included Ativan 1 mg PO. Pt was told that striking anyone will result in arrest. Remote was removed from the room. Emotional support was provided, and pt was told that inpatient placement is pending. He was given water and a blanket.   Pt continues to intermittently yell out, "I need help." He does not want to speak with this Probation officer while others are present, however. "I don't want people knowing my business."   Pt was oriented to the unit regarding camera monitoring and the location of the bathroom. "I know that; I've been here." He endorses SI, hallucinations of demons, and says he "doesn't want to hurt anyone." He denied hitting his sister or mother. MD Joni Fears was notified of pt's behavior, which mirrors what was witnessed in the quad.  Will continue to monitor for needs/safety.

## 2017-12-30 NOTE — BHH Counselor (Signed)
Patient would like to go back to [facility]...  TTS contacted [name] and explained the patient's needs and request; [facility] requested patient information to be faxed over to be reviewed...  Faxed Information to [facility] and [employer]

## 2017-12-30 NOTE — ED Provider Notes (Signed)
[facility] Emergency Department Provider Note   ____________________________________________   First MD Initiated Contact with Patient 12/30/17 [phone]     (approximate)  I have reviewed the triage vital signs and the nursing notes.   HISTORY  Chief Complaint Suicidal    HPI Michael Schmitt is a 47 y.o. male history of known coronary disease, mental illness, recent discharge from [name] and seen in our ER yesterday.   Patient presents reports to me that he still feels aggressive at times.  Reports he lives with family in the mornings he is been feeling aggressive.  He knows he received an in [place] shot recently before leaving the psychiatric hospital, but reports he just continues to get angry off and on at times.  He reported at triage that he wants to die, but he denies to me that he wants to die with any suicidal.  Does not want to hurt anyone else at present, but reports he just thinks he needs something to help "calm him"  He does not have any specific plan to harm anyone.  Denies attacking or injuring anyone today.  Denies hallucinations  Past Medical History:  Diagnosis Date  . Anxiety   . Anxiety   . Arthritis    cerv. stenosis, spondylosis, HNP- lower back , has been followed in pain clinic, has  had injection s in cerv. area  . Blood dyscrasia    told that when he was young he was a" free bleeder"  . CAD (coronary artery disease)   . Cervical spondylosis without myelopathy 07/24/2014  . Cervicogenic headache 07/24/2014  . Chronic kidney disease    renal calculi- passed spontaneously  . Depression   . Diabetes mellitus without complication ([place])   . Fatty liver   . GERD (gastroesophageal reflux disease)   . Headache(784.0)   . Hyperlipidemia   . Hypertension   . Mental disorder   . MI, old   . RLS (restless legs syndrome)    detected on sleep study  . Shortness of breath   . Ventricular fibrillation ([place]) 04/05/2016     Patient Active Problem List   Diagnosis Date Noted  . MDD (major depressive disorder), recurrent episode, severe ([place]) 11/20/2017  . Dysthymia 10/26/2017  . Severe recurrent major depression without psychotic features ([place]) 03/12/2017  . Generalized anxiety disorder 02/05/2017  . Chronic neck pain (Primary Area of Pain) (Left) 11/22/2016  . Cervical facet syndrome (Left) 11/22/2016  . Cervical radiculitis (Left) 11/22/2016  . History of cervical spinal surgery 11/22/2016  . Cervical spondylosis 11/22/2016  . Chronic shoulder pain (Secondary Area of Pain) (Left) 11/22/2016  . Arthropathy of shoulder (Left) 11/22/2016  . Chronic low back pain [facility] Area of Pain) (Bilateral) (L>R) 11/22/2016  . Lumbar facet syndrome (Bilateral) (L>R) 11/22/2016  . Chronic pain syndrome 11/21/2016  . Long term prescription benzodiazepine use 11/21/2016  . Opiate use 11/21/2016  . Uncontrolled type 2 diabetes mellitus without complication, without long-term current use of insulin ([place]) 10/19/2016  . Chronic Suicidal ideation 09/15/2016  . Coronary artery disease 09/12/2016  . HTN (hypertension) 06/26/2016  . GERD (gastroesophageal reflux disease) 06/26/2016  . Alcohol use disorder, severe, 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[place]) 06/26/2016  . Moderate benzodiazepine use disorder ([place]) 06/11/2016  . Sedative, hypnotic or anxiolytic use disorder, severe, dependence ([place]) 05/12/2016  . Ventricular fibrillation ([place]) 04/05/2016  . Combined hyperlipidemia 01/05/2016  . Cervical spondylosis without myelopathy 07/24/2014  . Severe episode of recurrent major depressive disorder, without  psychotic features ([place]) 04/15/2013  . Tobacco use disorder 10/20/2010  . Asthma 10/20/2010    Past Surgical History:  Procedure Laterality Date  . ANTERIOR CERVICAL DECOMP/DISCECTOMY FUSION  11/13/2011   Procedure: ANTERIOR CERVICAL DECOMPRESSION/DISCECTOMY FUSION 1 LEVEL;  Surgeon: [name], MD;  Location: [facility];   Service: Neurosurgery;  Laterality: N/A;  Anterior Cervical Decompression/discectomy Fusion. Cervical three-four.  Marland Kitchen CARDIAC CATHETERIZATION N/A 01/01/2015   Procedure: Left Heart Cath and Coronary Angiography;  Surgeon: [name], MD;  Location: [place] CV LAB;  Service: Cardiovascular;  Laterality: N/A;  . CARDIAC CATHETERIZATION N/A 04/05/2016   Procedure: Left Heart Cath and Coronary Angiography;  Surgeon: Belva Crome, MD;  Location: [place] CV LAB;  Service: Cardiovascular;  Laterality: N/A;  . CARDIAC CATHETERIZATION N/A 04/05/2016   Procedure: Coronary Stent Intervention;  Surgeon: Belva Crome, MD;  Location: [place] CV LAB;  Service: Cardiovascular;  Laterality: N/A;  . CARDIAC CATHETERIZATION N/A 04/05/2016   Procedure: Intravascular Ultrasound/IVUS;  Surgeon: Belva Crome, MD;  Location: [place] CV [facility];  Service: Cardiovascular;  Laterality: N/A;  . NASAL SINUS SURGERY     2005    Prior to Admission medications   Medication Sig Start Date End Date Taking? Authorizing Provider  carbamazepine (TEGRETOL) 200 MG tablet Take 200 mg by mouth 2 (two) times daily.  12/28/17  Yes [provider]  chlorproMAZINE (THORAZINE) 200 MG tablet Take 400 mg by mouth at bedtime.  12/13/17  Yes [provider]  clonazePAM (KLONOPIN) 1 MG tablet Take 1 mg by mouth 3 (three) times daily as needed.  12/28/17  Yes [provider]  divalproex (DEPAKOTE) 500 MG DR tablet Take 500 mg by mouth 2 (two) times daily.  12/05/17  Yes [provider]  doxepin (SINEQUAN) 50 MG capsule Take 50 mg by mouth at bedtime.  12/12/17  Yes [provider]  escitalopram (LEXAPRO) 10 MG tablet Take 10 mg by mouth daily.  11/30/17  Yes [provider]  gabapentin (NEURONTIN) 600 MG tablet Take 600 mg by mouth 3 (three) times daily.  12/28/17  Yes [provider]  Melatonin 5 MG TABS Take 1 tablet by mouth at bedtime.   Yes [provider]   nitroGLYCERIN (NITROSTAT) 0.4 MG SL tablet Place 1 tablet (0.4 mg total) under the tongue every 5 (five) minutes x 3 doses as needed. For chest pain 11/21/17 11/21/18 Yes Lindell Spar I, NP  prazosin (MINIPRESS) 2 MG capsule Take 2 mg by mouth at bedtime.  12/12/17  Yes [provider]  QUEtiapine (SEROQUEL) 300 MG tablet Take 600 mg by mouth at bedtime.  11/30/17  Yes [provider]  traZODone (DESYREL) 100 MG tablet Take 1 tablet (100 mg total) by mouth at bedtime as needed for sleep. 11/21/17  Yes Nwoko, Herbert Pun I, NP  TRINTELLIX 10 MG TABS tablet Take 1 tablet (10 mg total) by mouth daily. For depression 11/21/17  Yes Lindell Spar I, NP  aspirin 81 MG EC tablet Take 1 tablet (81 mg total) by mouth daily. For heart health 11/21/17   Lindell Spar I, NP  atorvastatin (LIPITOR) 80 MG tablet Take 1 tablet (80 mg total) by mouth daily at 6 PM. For high cholesterol 11/21/17   Lindell Spar I, NP  carvedilol (COREG) 6.25 MG tablet Take 1 tablet (6.25 mg total) by mouth 2 (two) times daily with a meal. For high blood pressure 11/21/17   Lindell Spar I, NP  clopidogrel (  PLAVIX) 75 MG tablet Take 1 tablet (75 mg total) by mouth daily. For blood clot prevention 11/22/17   Lindell Spar I, NP  gabapentin (NEURONTIN) 300 MG capsule Take 1 capsule (300 mg total) by mouth 3 (three) times daily. For agitation/neuropathic pain Patient not taking: Reported on 12/29/2017 11/21/17   Lindell Spar I, NP  lurasidone (LATUDA) 80 MG TABS tablet Take 1 tablet (80 mg total) by mouth at bedtime. For mood control 11/21/17   Lindell Spar I, NP  metFORMIN (GLUCOPHAGE) 500 MG tablet Take 1 tablet (500 mg total) by mouth 2 (two) times daily with a meal. For diabetes management 11/21/17   Lindell Spar I, NP  mupirocin cream (BACTROBAN) 2 % Apply topically 2 (two) times daily. 11/21/17   Lindell Spar I, NP  nicotine (NICODERM CQ - DOSED IN MG/24 HOURS) 21 mg/24hr patch Place 1 patch (21 mg total) onto the skin daily. (May buy from  over the counter): For smoking cessation 11/22/17   Lindell Spar I, NP  Oxcarbazepine (TRILEPTAL) 300 MG tablet Take 1 tablet (300 mg total) by mouth at bedtime. For mood stabilization 11/21/17   Lindell Spar I, NP  sulfamethoxazole-trimethoprim (BACTRIM DS,SEPTRA DS) 800-160 MG tablet Take 1 tablet by mouth every 12 (twelve) hours. For wound infection Patient not taking: Reported on 12/29/2017 11/21/17   Lindell Spar I, NP    Allergies Prednisone; Hydrocodone; and Varenicline  Family History  Problem Relation Age of Onset  . Prostate cancer Father   . Hypertension Mother   . Kidney Stones Mother   . Anxiety disorder Mother   . Depression Mother   . COPD Sister   . Hypertension Sister   . Diabetes Sister   . Depression Sister   . Anxiety disorder Sister   . Seizures Sister   . ADD / ADHD Son   . ADD / ADHD Daughter     Social History Social History   Tobacco Use  . Smoking status: Current Every Day Smoker    Packs/day: 2.00    Years: 17.00    Pack years: 34.00    Types: Cigarettes, Cigars  . Smokeless tobacco: Former Systems developer  . Tobacco comment: occassional snuff   Substance Use Topics  . Alcohol use: No    Alcohol/week: 0.0 standard drinks    Comment: last drinked in 3 months.   . Drug use: No    Review of Systems Constitutional: No fever/chills denies recent illness except for being hospitalized for psychiatric symptoms Eyes: No visual changes. ENT: No sore throat. Cardiovascular: Denies chest pain. Respiratory: Denies shortness of breath. Gastrointestinal: No abdominal pain.   Neurological: Negative for headaches, areas of focal weakness or numbness.    ____________________________________________   PHYSICAL EXAM:  VITAL SIGNS: ED Triage Vitals  Enc Vitals Group     BP 12/30/17 0850 (!) 150/100     Pulse Rate 12/30/17 0850 (!) 118     Resp 12/30/17 0850 16     Temp 12/30/17 0850 97.7 F (36.5 C)     Temp Source 12/30/17 0850 Oral     SpO2 12/30/17 0850  97 %     Weight 12/30/17 0851 210 lb (95.3 kg)     Height --      Head Circumference --      Peak Flow --      Pain Score 12/30/17 0851 7     Pain Loc --      Pain Edu? --      Excl. in  GC? --     Constitutional: Alert and oriented. Well appearing and in no acute distress.  He is resting comfortably, arouses to voice sits up and is calm. Eyes: Conjunctivae are  slightly injected. Head: Atraumatic. Nose: No congestion/rhinnorhea. Mouth/Throat: Mucous membranes are moist. Neck: No stridor.  Cardiovascular: Normal rate (90 bpm on my exam), regular rhythm. Grossly normal heart sounds.  Good peripheral circulation. Respiratory: Normal respiratory effort.  No retractions. Lungs CTAB. Gastrointestinal: Soft and nontender. No distention. Musculoskeletal: No lower extremity tenderness nor edema. Neurologic:  Normal speech and language. No gross focal neurologic deficits are appreciated.  Skin:  Skin is warm, dry and intact. No rash noted. Psychiatric: Mood and affect are calm, no evidence of agitation and he is pleasant. Speech and behavior are normal.  ____________________________________________   LABS (all labs ordered are listed, but only abnormal results are displayed)  Labs Reviewed  COMPREHENSIVE METABOLIC PANEL - Abnormal; Notable for the following components:      Result Value   Glucose, Bld 284 (*)    ALT 63 (*)    All other components within normal limits  ACETAMINOPHEN LEVEL - Abnormal; Notable for the following components:   Acetaminophen (Tylenol), Serum <10 (*)    All other components within normal limits  CBC - Abnormal; Notable for the following components:   RDW 14.9 (*)    All other components within normal limits  URINE DRUG SCREEN, QUALITATIVE ([facility] ONLY) - Abnormal; Notable for the following components:   Tricyclic, Ur Screen POSITIVE (*)    Benzodiazepine, Ur Scrn POSITIVE (*)    All other components within normal limits  ETHANOL  SALICYLATE LEVEL  CBG  MONITORING, ED   ____________________________________________  EKG   ____________________________________________  RADIOLOGY   ____________________________________________   PROCEDURES  Procedure(s) performed: None  Procedures  Critical Care performed: No  ____________________________________________   INITIAL IMPRESSION / ASSESSMENT AND PLAN / ED COURSE  Pertinent labs & imaging results that were available during my care of the patient were reviewed by me and considered in my medical decision making (see chart for details).   Patient presents for evaluation of agitated behavior, reports that is worse in the morning.  He is alert, well-appearing and in no acute distress.  Denies suicidal or homicidal ideations to me.  He recently seen by psychiatry and released yesterday from the ER, and also recently hospitalized at [facility].  Does not appear to be any acute medical complaint, and he does not appear agitated or aggressive at this time.  I will obtain psychiatry consult for disposition and further recommendations.  Patient voluntary at this time, willing to undergo evaluation and be seen by psychiatry.    ----------------------------------------- 10:40 AM on 12/30/2017 -----------------------------------------  Patient awaiting tele-psych, coming agitated and he has been occasionally yelling out that it is taking too long and he is slapping himself in the forehead.  Discussed with patient that he needs to maintain some patients and understand that tele-psychiatry is planning to see him but he needs to be patient.  Patient somewhat calmed with verbal redirection, however still slightly agitated.    ----------------------------------------- 3:44 PM on 12/30/2017 -----------------------------------------  Ongoing care to Dr. Joni Fears.  Patient has been placed under involuntary commitment, pending further evaluation and disposition by psychiatry.  Seen by tele-psychiatry  are currently recommending ongoing IVC.  ____________________________________________   FINAL CLINICAL IMPRESSION(S) / ED DIAGNOSES  Final diagnoses:  Mood disorder ([place])        Note:  This document  was prepared using Systems analyst and may include unintentional dictation errors       Delman Kitten, MD 12/30/17 1545

## 2017-12-30 NOTE — ED Notes (Signed)
Bodie began yelling and using profanity while this writer was in another patient's room. Spoke with patient about the effects of his bad behavior and how it was counterproductive to some of his goals. Pt apologized and was advised that apologies are best backed up by changed behavior. Pt asked for large-print information on anger management, which was provided. Pt is quiet for the moment. Will continue to monitor for needs and safety.

## 2017-12-30 NOTE — ED Notes (Signed)
Pt is now resting with eyes closed and regular, even respirations.

## 2017-12-30 NOTE — ED Notes (Signed)
Pt awakened and was given his dinner tray. He has made a phone call, during which he yelled at the person on the other end. He requested the TV remote and was told he could not have a remote because he threw it earlier. Pt denied such, even though security saw him on camera throw it and this writer heard it from inside the nurses' station. He has resumed yelling intermittently. Will continue to monitor for needs/safety.

## 2017-12-30 NOTE — ED Notes (Signed)
Hourly rounding reveals patient in room. Stable, in no acute distress. Patient fixated on what medications he is to be administered this evening. When told that there were no medications ordered for him this evening he yelled out "Bullshit".  Q15 minute rounds and monitoring via [employer] to continue.

## 2017-12-30 NOTE — ED Notes (Signed)
Report to include Situation, Background, Assessment, and Recommendations received from [employer]. Patient alert and oriented, warm and dry, in no acute distress. Patient denies HI, VH and pain. Patient states he has SI without a plan and hears voices without command. Patient made aware of Q15 minute rounds and security cameras for their safety. Patient instructed to come to me with needs or concerns.

## 2017-12-30 NOTE — ED Notes (Signed)
Pt yelling, "I want some help!"  When asked what he needed pt states, "I need some medication for ADHD.  My daughter has it and I think that I have it too."  Pt requires frequent redirection.

## 2017-12-30 NOTE — ED Notes (Signed)
Hourly rounding reveals patient in rest room. No complaints, stable, in no acute distress. Q15 minute rounds and monitoring via [employer] to continue.

## 2017-12-30 NOTE — ED Notes (Signed)
BELONGINGS: Patient's sister dropped off a suitcase of clothing in case he gets an inpatient bed. The suitcase is in belongings room with his bag of belongings.

## 2017-12-30 NOTE — Progress Notes (Signed)
LCSW met with patient and reminded him we have worked together, patient agreed to take some deep breaths, he made several verbally abusive and racial statements. LCSW and Nurse Tech redirected patient and he apologized. With a refreshment and warm blanket he agreed not to yell out and disturb other patients.   [employer] LCSW [phone]
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
## 2017-12-30 NOTE — ED Triage Notes (Signed)
Pt to ED via POV, Pt states "I want to die, I don't feel no better". Pt is calm and cooperative currently. In NAD.

## 2017-12-30 NOTE — ED Notes (Signed)
Hourly rounding reveals patient in room. Stable, in no acute distress, yelling out intermittantly. Q15 minute rounds and monitoring via [employer] to continue.

## 2017-12-30 NOTE — ED Notes (Signed)
Pt. intermittently yelling out from his room.

## 2017-12-31 DIAGNOSIS — F1310 Sedative, hypnotic or anxiolytic abuse, uncomplicated: Secondary | ICD-10-CM

## 2017-12-31 DIAGNOSIS — F609 Personality disorder, unspecified: Secondary | ICD-10-CM

## 2017-12-31 MED ORDER — LORAZEPAM 2 MG/ML IJ SOLN
INTRAMUSCULAR | Status: AC
  Filled 2017-12-31: qty 1

## 2017-12-31 MED ORDER — ACETAMINOPHEN 325 MG PO TABS
650.0000 mg | ORAL_TABLET | Freq: Once | ORAL | Status: AC
  Administered 2017-12-31: 650 mg via ORAL
  Filled 2017-12-31: qty 2

## 2017-12-31 MED ORDER — LORAZEPAM 2 MG/ML IJ SOLN
2.0000 mg | Freq: Once | INTRAMUSCULAR | Status: AC
  Administered 2017-12-31: 2 mg via INTRAMUSCULAR

## 2017-12-31 MED ORDER — LORAZEPAM 2 MG PO TABS
2.0000 mg | ORAL_TABLET | Freq: Once | ORAL | Status: DC
  Filled 2017-12-31: qty 1

## 2017-12-31 NOTE — ED Notes (Signed)
Pt discharged to his sister in the lobby. VS stable. All belongings returned to patient. Pt given and signed for discharge paperwork.

## 2017-12-31 NOTE — ED Notes (Addendum)
Pt allowed to have his reading glasses and bible. Cooperative at this time.  Maintained on 15 minute checks and observation by security camera for safety.

## 2017-12-31 NOTE — Consult Note (Signed)
Psychiatry: Brief note.  Full note to follow.  Chart reviewed patient evaluated.  Patient is not currently suicidal and has a long history of behavior and mood instability often made much worse by his abuse of benzodiazepines.  He is not going to benefit from inpatient hospitalization.  Behavior is worsened here in the emergency room.  Does not meet commitment criteria any longer and does not require inpatient treatment.  Case reviewed with ER doctor.  IVC discontinued

## 2017-12-31 NOTE — ED Notes (Signed)
Hourly rounding reveals patient sleeping in room. No complaints, stable, in no acute distress. Q15 minute rounds and monitoring via Security Cameras to continue. 

## 2017-12-31 NOTE — ED Notes (Signed)
Pt is waiting for discharge paperwork. Pt is pacing and stating he is ready to get out of the hospital.   Maintained on 15 minute checks and observation by security camera for safety.

## 2017-12-31 NOTE — ED Notes (Signed)
Patient at the door to nurses station asking for "sleep medication". Patient informed of the time he has slept this shift and the fact that it would be too late for pain meds at this point anyway.
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
## 2017-12-31 NOTE — ED Provider Notes (Signed)
-----------------------------------------   6:50 AM on 12/31/2017 -----------------------------------------   Blood pressure 130/74, pulse 88, temperature 97.8 F (36.6 C), temperature source Oral, resp. rate 18, weight 95.3 kg, SpO2 99 %.  The patient had no acute events since last update.  Calm and cooperative at this time.  Disposition is pending Psychiatry/Behavioral Medicine team recommendations.     Paulette Blanch, MD 12/31/17 [phone]

## 2017-12-31 NOTE — ED Notes (Signed)
Pt administered  IM medication as ordered by EDP.   Pt had been yelling in his room, audible in nurse's station. RN unable to verbally re-direct patient.  Maintained on 15 minute checks and observation by security camera for safety.

## 2017-12-31 NOTE — ED Provider Notes (Signed)
Cleared for discharge by Dr. Bethena Midget, MD 12/31/17 1215

## 2018-01-03 ENCOUNTER — Other Ambulatory Visit: Payer: Self-pay

## 2018-01-03 ENCOUNTER — Other Ambulatory Visit: Payer: Self-pay | Admitting: *Deleted

## 2018-01-03 NOTE — Patient Outreach (Signed)
[name]  01/03/2018  Michael Schmitt 25-Dec-1970 [id number]   Care coordination  Referral Source: [facility] UM referral Referral Reason:Per UM patient discharged from [facility] on 10/4. Has had several admissions since then. Patient having suicidal thoughts   Plan: [facility] RN CM will close this referral as this [facility] RN CM was notified after consulting [facility] Leadership and [facility] team member that [facility] UM was informed on 01/02/18 by [facility] Leadership to call 911 on 01/02/18 for this patient in crisis.  Kimberly L. Lavina Hamman, RN, BSN, [place] Coordinator Office number [phone] Mobile number [phone]  Main [facility] number [phone] Fax number [phone]

## 2018-01-03 NOTE — Patient Outreach (Addendum)
[facility]) Care Management  01/03/2018  Michael Schmitt Aug 07, 1970 [id number]   Telephone Screen Referral Date :01/01/2018 Referral Source:[facility] ED Census Referral Reason:ED Utilization Insurance: [place] notified on 01/02/2018 the patient was in crisis.   After consulting with [facility] Team member and Leadership the patient had spoken with the [place]. The [facility] UM team  was informed by leadership on 01/02/2018 to call 911 for the patient.   Plan: RN Health Coach will close the case at this time.   Lazaro Arms RN, BSN, [place] Direct Dial:  [phone] Fax: [phone]

## 2018-01-04 ENCOUNTER — Other Ambulatory Visit: Payer: Self-pay | Admitting: *Deleted

## 2018-01-04 ENCOUNTER — Encounter (HOSPITAL_COMMUNITY): Payer: Self-pay | Admitting: *Deleted

## 2018-01-04 ENCOUNTER — Emergency Department (HOSPITAL_COMMUNITY)
Admission: EM | Admit: 2018-01-04 | Discharge: 2018-01-08 | Disposition: A | Discharge status: Home / Self Care | Payer: Medicare Other | Attending: Emergency Medicine | Admitting: Emergency Medicine

## 2018-01-04 DIAGNOSIS — Z7902 Long term (current) use of antithrombotics/antiplatelets: Secondary | ICD-10-CM | POA: Diagnosis not present

## 2018-01-04 DIAGNOSIS — F329 Major depressive disorder, single episode, unspecified: Secondary | ICD-10-CM | POA: Diagnosis present

## 2018-01-04 DIAGNOSIS — Z7982 Long term (current) use of aspirin: Secondary | ICD-10-CM | POA: Insufficient documentation

## 2018-01-04 DIAGNOSIS — J45909 Unspecified asthma, uncomplicated: Secondary | ICD-10-CM | POA: Diagnosis not present

## 2018-01-04 DIAGNOSIS — I252 Old myocardial infarction: Secondary | ICD-10-CM | POA: Diagnosis not present

## 2018-01-04 DIAGNOSIS — Z7984 Long term (current) use of oral hypoglycemic drugs: Secondary | ICD-10-CM | POA: Insufficient documentation

## 2018-01-04 DIAGNOSIS — R45851 Suicidal ideations: Secondary | ICD-10-CM | POA: Diagnosis not present

## 2018-01-04 DIAGNOSIS — E119 Type 2 diabetes mellitus without complications: Secondary | ICD-10-CM | POA: Insufficient documentation

## 2018-01-04 DIAGNOSIS — I2510 Atherosclerotic heart disease of native coronary artery without angina pectoris: Secondary | ICD-10-CM | POA: Diagnosis not present

## 2018-01-04 DIAGNOSIS — F3130 Bipolar disorder, current episode depressed, mild or moderate severity, unspecified: Secondary | ICD-10-CM | POA: Diagnosis not present

## 2018-01-04 DIAGNOSIS — Z008 Encounter for other general examination: Secondary | ICD-10-CM | POA: Diagnosis not present

## 2018-01-04 DIAGNOSIS — Z79899 Other long term (current) drug therapy: Secondary | ICD-10-CM | POA: Insufficient documentation

## 2018-01-04 DIAGNOSIS — I10 Essential (primary) hypertension: Secondary | ICD-10-CM | POA: Insufficient documentation

## 2018-01-04 DIAGNOSIS — F17210 Nicotine dependence, cigarettes, uncomplicated: Secondary | ICD-10-CM | POA: Diagnosis not present

## 2018-01-04 LAB — CBC
HEMATOCRIT: 44.5 % (ref 39.0–52.0)
Hemoglobin: 14 g/dL (ref 13.0–17.0)
MCH: 29.3 pg (ref 26.0–34.0)
MCHC: 31.5 g/dL (ref 30.0–36.0)
MCV: 93.1 fL (ref 80.0–100.0)
Platelets: 291 10*3/uL (ref 150–400)
RBC: 4.78 MIL/uL (ref 4.22–5.81)
RDW: 14.5 % (ref 11.5–15.5)
WBC: 7.6 10*3/uL (ref 4.0–10.5)
nRBC: 0 % (ref 0.0–0.2)

## 2018-01-04 LAB — RAPID URINE DRUG SCREEN, HOSP PERFORMED
Amphetamines: NOT DETECTED
BARBITURATES: NOT DETECTED
Benzodiazepines: POSITIVE — AB
Cocaine: NOT DETECTED
Opiates: NOT DETECTED
Tetrahydrocannabinol: NOT DETECTED

## 2018-01-04 LAB — ACETAMINOPHEN LEVEL: Acetaminophen (Tylenol), Serum: <10 ug/mL — ABNORMAL LOW (ref 10–30)

## 2018-01-04 LAB — COMPREHENSIVE METABOLIC PANEL
ALK PHOS: 104 U/L (ref 38–126)
ALT: 46 U/L — AB (ref 0–44)
AST: 35 U/L (ref 15–41)
Albumin: 3.9 g/dL (ref 3.5–5.0)
Anion gap: 10 (ref 5–15)
BILIRUBIN TOTAL: 0.4 mg/dL (ref 0.3–1.2)
BUN: 5 mg/dL — ABNORMAL LOW (ref 6–20)
CO2: 22 mmol/L (ref 22–32)
Calcium: 9.2 mg/dL (ref 8.9–10.3)
Chloride: 105 mmol/L (ref 98–111)
Creatinine, Ser: 0.96 mg/dL (ref 0.61–1.24)
GFR calc non Af Amer: >60 mL/min (ref >60)
Glucose, Bld: 340 mg/dL — ABNORMAL HIGH (ref 70–99)
Potassium: 3.8 mmol/L (ref 3.5–5.1)
Sodium: 137 mmol/L (ref 135–145)
TOTAL PROTEIN: 6.7 g/dL (ref 6.5–8.1)

## 2018-01-04 LAB — ETHANOL

## 2018-01-04 LAB — SALICYLATE LEVEL: Salicylate Lvl: <7 mg/dL (ref 2.8–30.0)

## 2018-01-04 MED ORDER — LURASIDONE HCL 40 MG PO TABS
80.0000 mg | ORAL_TABLET | Freq: Every day | ORAL | Status: DC
  Administered 2018-01-06: 80 mg via ORAL
  Filled 2018-01-04 (×4): qty 2

## 2018-01-04 MED ORDER — NITROGLYCERIN 0.4 MG SL SUBL: 0.4000 mg | SUBLINGUAL_TABLET | SUBLINGUAL | Status: DC | PRN

## 2018-01-04 MED ORDER — NICOTINE 21 MG/24HR TD PT24
21.0000 mg | MEDICATED_PATCH | Freq: Every day | TRANSDERMAL | Status: DC
  Administered 2018-01-04 – 2018-01-05 (×2): 21 mg via TRANSDERMAL
  Filled 2018-01-04: qty 1

## 2018-01-04 MED ORDER — LORAZEPAM 1 MG PO TABS
1.0000 mg | ORAL_TABLET | Freq: Once | ORAL | Status: AC
  Administered 2018-01-04: 1 mg via ORAL
  Filled 2018-01-04: qty 1

## 2018-01-04 MED ORDER — ESCITALOPRAM OXALATE 10 MG PO TABS
10.0000 mg | ORAL_TABLET | Freq: Every day | ORAL | Status: DC
  Administered 2018-01-05 – 2018-01-08 (×3): 10 mg via ORAL
  Filled 2018-01-04 (×5): qty 1

## 2018-01-04 MED ORDER — ATORVASTATIN CALCIUM 80 MG PO TABS
80.0000 mg | ORAL_TABLET | Freq: Every day | ORAL | Status: DC
  Administered 2018-01-06 – 2018-01-07 (×2): 80 mg via ORAL
  Filled 2018-01-04 (×3): qty 1

## 2018-01-04 MED ORDER — METFORMIN HCL 500 MG PO TABS
500.0000 mg | ORAL_TABLET | Freq: Two times a day (BID) | ORAL | Status: DC
  Administered 2018-01-04 – 2018-01-08 (×8): 500 mg via ORAL
  Filled 2018-01-04 (×8): qty 1

## 2018-01-04 MED ORDER — ASPIRIN EC 81 MG PO TBEC
81.0000 mg | DELAYED_RELEASE_TABLET | Freq: Every day | ORAL | Status: DC
  Administered 2018-01-04 – 2018-01-07 (×4): 81 mg via ORAL
  Filled 2018-01-04 (×4): qty 1

## 2018-01-04 MED ORDER — QUETIAPINE FUMARATE 200 MG PO TABS
600.0000 mg | ORAL_TABLET | Freq: Every day | ORAL | Status: DC
  Administered 2018-01-04 – 2018-01-07 (×4): 600 mg via ORAL
  Filled 2018-01-04 (×4): qty 3

## 2018-01-04 MED ORDER — VORTIOXETINE HBR 10 MG PO TABS: 10.0000 mg | ORAL_TABLET | Freq: Every day | ORAL | Status: DC

## 2018-01-04 MED ORDER — GABAPENTIN 600 MG PO TABS
600.0000 mg | ORAL_TABLET | Freq: Three times a day (TID) | ORAL | Status: DC
  Administered 2018-01-04 – 2018-01-08 (×12): 600 mg via ORAL
  Filled 2018-01-04 (×12): qty 1

## 2018-01-04 MED ORDER — DIVALPROEX SODIUM 250 MG PO DR TAB
500.0000 mg | DELAYED_RELEASE_TABLET | Freq: Two times a day (BID) | ORAL | Status: DC
  Administered 2018-01-04 – 2018-01-08 (×8): 500 mg via ORAL
  Filled 2018-01-04 (×8): qty 2

## 2018-01-04 MED ORDER — LORAZEPAM 1 MG PO TABS
1.0000 mg | ORAL_TABLET | Freq: Once | ORAL | Status: AC
  Administered 2018-01-06: 1 mg via ORAL
  Filled 2018-01-04 (×2): qty 1

## 2018-01-04 MED ORDER — HALOPERIDOL LACTATE 5 MG/ML IJ SOLN
10.0000 mg | Freq: Once | INTRAMUSCULAR | Status: AC
  Administered 2018-01-04: 10 mg via INTRAMUSCULAR
  Filled 2018-01-04: qty 2

## 2018-01-04 MED ORDER — CLOPIDOGREL BISULFATE 75 MG PO TABS
75.0000 mg | ORAL_TABLET | Freq: Every day | ORAL | Status: DC
  Administered 2018-01-05 – 2018-01-08 (×4): 75 mg via ORAL
  Filled 2018-01-04 (×5): qty 1

## 2018-01-04 MED ORDER — CLONAZEPAM 0.5 MG PO TABS
1.0000 mg | ORAL_TABLET | Freq: Three times a day (TID) | ORAL | Status: DC
  Administered 2018-01-04 – 2018-01-08 (×12): 1 mg via ORAL
  Filled 2018-01-04 (×12): qty 2

## 2018-01-04 MED ORDER — DOXEPIN HCL 50 MG PO CAPS
50.0000 mg | ORAL_CAPSULE | Freq: Every day | ORAL | Status: DC
  Administered 2018-01-04 – 2018-01-07 (×4): 50 mg via ORAL
  Filled 2018-01-04 (×4): qty 1

## 2018-01-04 MED ORDER — CARBAMAZEPINE 200 MG PO TABS
200.0000 mg | ORAL_TABLET | Freq: Two times a day (BID) | ORAL | Status: DC
  Administered 2018-01-04 – 2018-01-08 (×8): 200 mg via ORAL
  Filled 2018-01-04 (×8): qty 1

## 2018-01-04 MED ORDER — TRAZODONE HCL 100 MG PO TABS: 100.0000 mg | ORAL_TABLET | Freq: Every evening | ORAL | Status: DC | PRN

## 2018-01-04 MED ORDER — CARVEDILOL 12.5 MG PO TABS
6.2500 mg | ORAL_TABLET | Freq: Two times a day (BID) | ORAL | Status: DC
  Administered 2018-01-04 – 2018-01-08 (×8): 6.25 mg via ORAL
  Filled 2018-01-04 (×8): qty 1

## 2018-01-04 MED ORDER — OXCARBAZEPINE 300 MG PO TABS
300.0000 mg | ORAL_TABLET | Freq: Every day | ORAL | Status: DC
  Administered 2018-01-04 – 2018-01-07 (×4): 300 mg via ORAL
  Filled 2018-01-04 (×4): qty 1

## 2018-01-04 MED ORDER — NICOTINE 21 MG/24HR TD PT24
21.0000 mg | MEDICATED_PATCH | Freq: Every day | TRANSDERMAL | Status: DC
  Administered 2018-01-06 – 2018-01-07 (×2): 21 mg via TRANSDERMAL
  Filled 2018-01-04 (×4): qty 1

## 2018-01-04 NOTE — ED Notes (Signed)
Pt ate 100% of dinner tray

## 2018-01-04 NOTE — ED Notes (Signed)
Pt ambulate to bathroom 
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
## 2018-01-04 NOTE — ED Notes (Signed)
Pt placed in E46 for TTS consult. Will move back to hallway when finished.

## 2018-01-04 NOTE — Patient Outreach (Signed)
[facility]) Care Management  01/04/2018  [name] 11/23/1970 [id number]    Patient referred to this [facility] social worker by care management assistant for assistance with mental health services, and transportation services to the assistance. The member has specifically mentioned [facility] as his preference.   Per EMR, patient now in the emergency room due to thoughts of self harm.  This social worker will continue to follow and will assess for community resource needs post discharge from the hospital.   Elliot Gurney, [place] Management [phone]
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
## 2018-01-04 NOTE — ED Notes (Signed)
Pt dinner tray ordered.

## 2018-01-04 NOTE — Progress Notes (Signed)
Pt meets inpatient criteria per Earleen Newport, NP. Referral information has been sent to the following hospitals for review:  [facility]  [facility]  [facility]   Disposition will continue to assist with placement needs.   Audree Camel, LCSW, [place] Dis[facility] BHH/TTS [phone]
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
## 2018-01-04 NOTE — ED Notes (Signed)
Patient ambulatory to bathroom with steady gait at this time 

## 2018-01-04 NOTE — ED Notes (Signed)
Pt requested of sitter to speak with psychiatist, TTS present and at bedside providing update of care

## 2018-01-04 NOTE — ED Notes (Signed)
Pt having outbursts of shouting swear words, "help me!" and "I just want to die." Asking for something to help him calm down. Ativan 1mg  PO initially ordered, pt refused this saying it wouldn't work and he "needs a shot of something." Haldol 10mg  ordered

## 2018-01-05 ENCOUNTER — Other Ambulatory Visit: Payer: Self-pay

## 2018-01-05 ENCOUNTER — Encounter (HOSPITAL_COMMUNITY): Payer: Self-pay | Admitting: Registered Nurse

## 2018-01-05 NOTE — ED Notes (Signed)
Pt talking w/his mother and children on phone.

## 2018-01-05 NOTE — ED Notes (Signed)
Re-TTS being performed.  

## 2018-01-05 NOTE — ED Notes (Signed)
Pt ambulated to bathroom then to nurses' desk - on phone.

## 2018-01-05 NOTE — ED Notes (Signed)
Patient is resting with sitting watching

## 2018-01-05 NOTE — ED Notes (Signed)
Patient using his second privilege for the the phone.

## 2018-01-05 NOTE — BH Assessment (Signed)
01/05/2018:  Reassessment Patient reports receiving good quality sleep last night and has an appetite this morning and ate breakfast.  Patient reports current SI with intent and no plan.  He denied current HI and AVH.  Patient reports current feeling of severe depression and anger.  Patient reports wanting inpatient psychiatric care.      Per Earleen Newport, NP;  Patient continues to meet inpatient criteria.
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
## 2018-01-06 NOTE — ED Notes (Signed)
Pt dinner tray ordered.

## 2018-01-06 NOTE — ED Notes (Signed)
Re-TTS completed.  

## 2018-01-06 NOTE — ED Notes (Signed)
Pt in shower.  

## 2018-01-06 NOTE — ED Notes (Signed)
Pt on phone at nurses' desk - states he needed to check on his kids. Voiced understanding this is his last phone call for the day.

## 2018-01-06 NOTE — ED Notes (Signed)
Offered for pt shower x 2.

## 2018-01-06 NOTE — ED Notes (Signed)
Pt given Sprite and requested room lights turned off. Pt is resting quietly and watching TV in bed.

## 2018-01-06 NOTE — ED Notes (Signed)
Pt on phone at nurses' desk. Voiced understanding of 2 phone calls allowed per day.

## 2018-01-06 NOTE — Progress Notes (Signed)
Patient meets criteria for inpatient treatment--no appropriate beds available at [facility]. CSW re-faxed referrals to the following facilities for review:  [facility], [facility], [facility], [facility], [facility], [facility] will continue to seek bed placement.   Maxie Better, MSW, LCSW Clinical Social Worker 01/06/2018 2:59 PM

## 2018-01-07 NOTE — ED Notes (Signed)
Carb modified lunch tray ordered for pt.

## 2018-01-07 NOTE — ED Notes (Signed)
Pt to desk stating he is wanting to leave. Pt is currently denying SI/HI. Pt calm and cooperative at this time. Will notify SW at [facility].

## 2018-01-07 NOTE — ED Notes (Signed)
Pt using 2 of 2 of daily phone calls.

## 2018-01-07 NOTE — ED Notes (Signed)
Pt given drink and crackers  

## 2018-01-07 NOTE — Progress Notes (Signed)
Inpatient Diabetes Program Recommendations  AACE/ADA: New Consensus Statement on Inpatient Glycemic Control (2015)  Target Ranges:  Prepandial:   less than 140 mg/dL      Peak postprandial:   less than 180 mg/dL (1-2 hours)      Critically ill patients:  140 - 180 mg/dL   Review of Glycemic Control  Diabetes history: DM 2 Outpatient Diabetes medications: Metformin 500 mg BID Current orders for Inpatient glycemic control: Metformin 500 mg BID  Inpatient Diabetes Program Recommendations:    Noted patient was changed from Regular to carb modified diet. Home meds were ordered.  While awaiting potential placement, please consider checking CBGs [facility].  Thanks,  Tama Headings RN, MSN, BC-ADM Inpatient Diabetes Coordinator Team Pager [phone] (8a-5p)

## 2018-01-07 NOTE — Progress Notes (Signed)
Pt. Continues to meet criteria for inpatient treatment per Elmarie Shiley, NP.  Referred out to the following hospitals: [facility]  [facility]  [facility]  [facility]     Disposition CSW will continue to follow for placement.  Areatha Keas. Judi Cong, MSW, [place] Disposition Clinical Social Work [phone] (cell) [phone] (office)

## 2018-01-07 NOTE — ED Notes (Signed)
Pt used 1 of 2 phone calls for the day.
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
## 2018-01-08 ENCOUNTER — Encounter (HOSPITAL_COMMUNITY): Payer: Self-pay | Admitting: Registered Nurse

## 2018-01-08 MED ORDER — DOXEPIN HCL 50 MG PO CAPS: 50.0000 mg | ORAL_CAPSULE | Freq: Every day | ORAL | 0 refills | Status: DC

## 2018-01-08 MED ORDER — TRAZODONE HCL 100 MG PO TABS: 100.0000 mg | ORAL_TABLET | Freq: Every day | ORAL | 0 refills | Status: DC

## 2018-01-08 MED ORDER — DIVALPROEX SODIUM 500 MG PO DR TAB: 500.0000 mg | DELAYED_RELEASE_TABLET | Freq: Every day | ORAL | 0 refills | Status: DC

## 2018-01-08 MED ORDER — OXCARBAZEPINE 300 MG PO TABS: 300.0000 mg | ORAL_TABLET | Freq: Every day | ORAL | 0 refills | Status: DC

## 2018-01-08 MED ORDER — CARBAMAZEPINE 200 MG PO TABS: 200.0000 mg | ORAL_TABLET | Freq: Every day | ORAL | 0 refills | Status: DC

## 2018-01-08 MED ORDER — LURASIDONE HCL 80 MG PO TABS: 80.0000 mg | ORAL_TABLET | Freq: Every day | ORAL | 0 refills | Status: DC
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
## 2018-01-08 NOTE — ED Notes (Signed)
Sister has arrived to pick up pt. Aware of delay w/paperwork.

## 2018-01-08 NOTE — ED Notes (Signed)
Carb Modified was ordered for Lunch.

## 2018-01-08 NOTE — ED Notes (Signed)
Pt ambulatory to nurses' desk - asking about delay. Advised pt EDP is aware and apologized for delay.

## 2018-01-08 NOTE — ED Notes (Signed)
Pt aware of tx plan - d/c to home. Aware Dr Alvino Chapel is aware. Pt ambulating in hallway - RN requested for pt to ambulate in his room - pt voiced understanding and returned to room.

## 2018-01-08 NOTE — ED Notes (Signed)
Carb modified breakfast tray ordered 
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
## 2018-01-08 NOTE — ED Notes (Signed)
States he wants to talk to [facility] d/t he is no longer SI.

## 2018-01-08 NOTE — ED Notes (Signed)
Pt on phone at nurses' desk. 

## 2018-01-08 NOTE — ED Notes (Signed)
ALL belongings - 2 labeled belongings bags - NO Valuables noted - returned to pt - Pt signed verified all items present.

## 2018-01-08 NOTE — ED Notes (Signed)
Patient was given a drink and a Snack.

## 2018-01-08 NOTE — Discharge Instructions (Signed)
Follow-up tomorrow as planned.

## 2018-01-08 NOTE — ED Notes (Signed)
Pt ambulating between his room and hallway - aware of delay w/discharge.

## 2018-01-09 ENCOUNTER — Ambulatory Visit: Payer: Self-pay | Admitting: *Deleted

## 2018-01-09 ENCOUNTER — Other Ambulatory Visit: Payer: Self-pay | Admitting: *Deleted

## 2018-01-09 NOTE — Patient Outreach (Addendum)
[place] [facility]) Care Management  01/09/2018  Michael Schmitt 1970-09-26 [id number]  Attempt #1 Patient referred to this [facility] social worker  following his hospital stay for depression and suicide ideation. Patient discharged from [facility] on 01/08/18. Patient contacted on both his home and mobile number both of which were not accepting calls. Voicemail message could not be left.  Phone call to patient's sister and  DPR [name] on both numbers listed. Both were out of service.   Plan: This Education officer, museum will send a Economist and will follow with a second call within 3-4 business days.   Sheralyn Boatman [facility] Care Management [phone]

## 2018-01-11 ENCOUNTER — Ambulatory Visit: Payer: Self-pay | Admitting: *Deleted

## 2018-01-11 ENCOUNTER — Other Ambulatory Visit: Payer: Self-pay | Admitting: *Deleted

## 2018-01-11 NOTE — Patient Outreach (Signed)
[place] [facility]) Care Management  01/11/2018  Michael Schmitt [facility] March 12, 1971 [id number]   Second outreach attempt:   This patient was referred to this [facility] social worker  for follow up following his hospital stay for depression and suicide ideation. Patient discharged from [facility] on 01/08/18. Patient contacted on both his home and mobile number both of which were not accepting calls. Voicemail message could not be left.  Phone call to patient's sister and  DPR [name] on both numbers listed. Both were out of service.   Plan: This Education officer, museum will send a Economist and will follow with a second call within 3-4 business days.   Sheralyn Boatman [facility] Care Management [phone]

## 2018-01-17 ENCOUNTER — Ambulatory Visit: Payer: Self-pay | Admitting: *Deleted

## 2018-01-17 ENCOUNTER — Other Ambulatory Visit: Payer: Self-pay | Admitting: *Deleted

## 2018-01-17 NOTE — Patient Outreach (Signed)
[facility] [facility]) Care Management  01/17/2018  Michael Schmitt 1970-07-23 [id number]   Attempt #3 Patient referred to this [facility] social worker  following his hospital stay for depression and suicide ideation. Patient discharged from [facility] on 01/08/18. Patient contacted on both his home and mobile number both of which were out of service.  Phone call to patient's sister and  DPR Michael Schmitt on both numbers listed. Both continue to be out of service.   This Education officer, museum has received no response from the outreach letter sent. If there is no return call by 01/22/18, patient will be closed.    Sheralyn Boatman [facility] Care Management [phone]

## 2018-01-24 ENCOUNTER — Other Ambulatory Visit: Payer: Self-pay | Admitting: *Deleted

## 2018-01-24 NOTE — Patient Outreach (Signed)
[place] [facility]) Care Management  01/24/2018  Imari Sivertsen 1970-07-26 [id number]   Patient referred to this [facility] social worker following his hospital stay for depression and suicide ideation. Patient discharged from [facility] on 01/08/18.Patient contacted on both his home and mobile number both of which were out of service. Attempts made to contact patient's sister and DPR [name] which were unsuccessful.   Plan: Patient to be closed to [place] Management due to inability to establish contact with patient.   Sheralyn Boatman [facility] Care Management [phone]

## 2018-02-11 IMAGING — CR DG CHEST 2V
2 series · 2 of 2 positions shown · non-contrast
Comparison: October 03, 2015

CLINICAL DATA: Chest pain for 1 day

EXAM:
CHEST  2 VIEW

[chest pa]
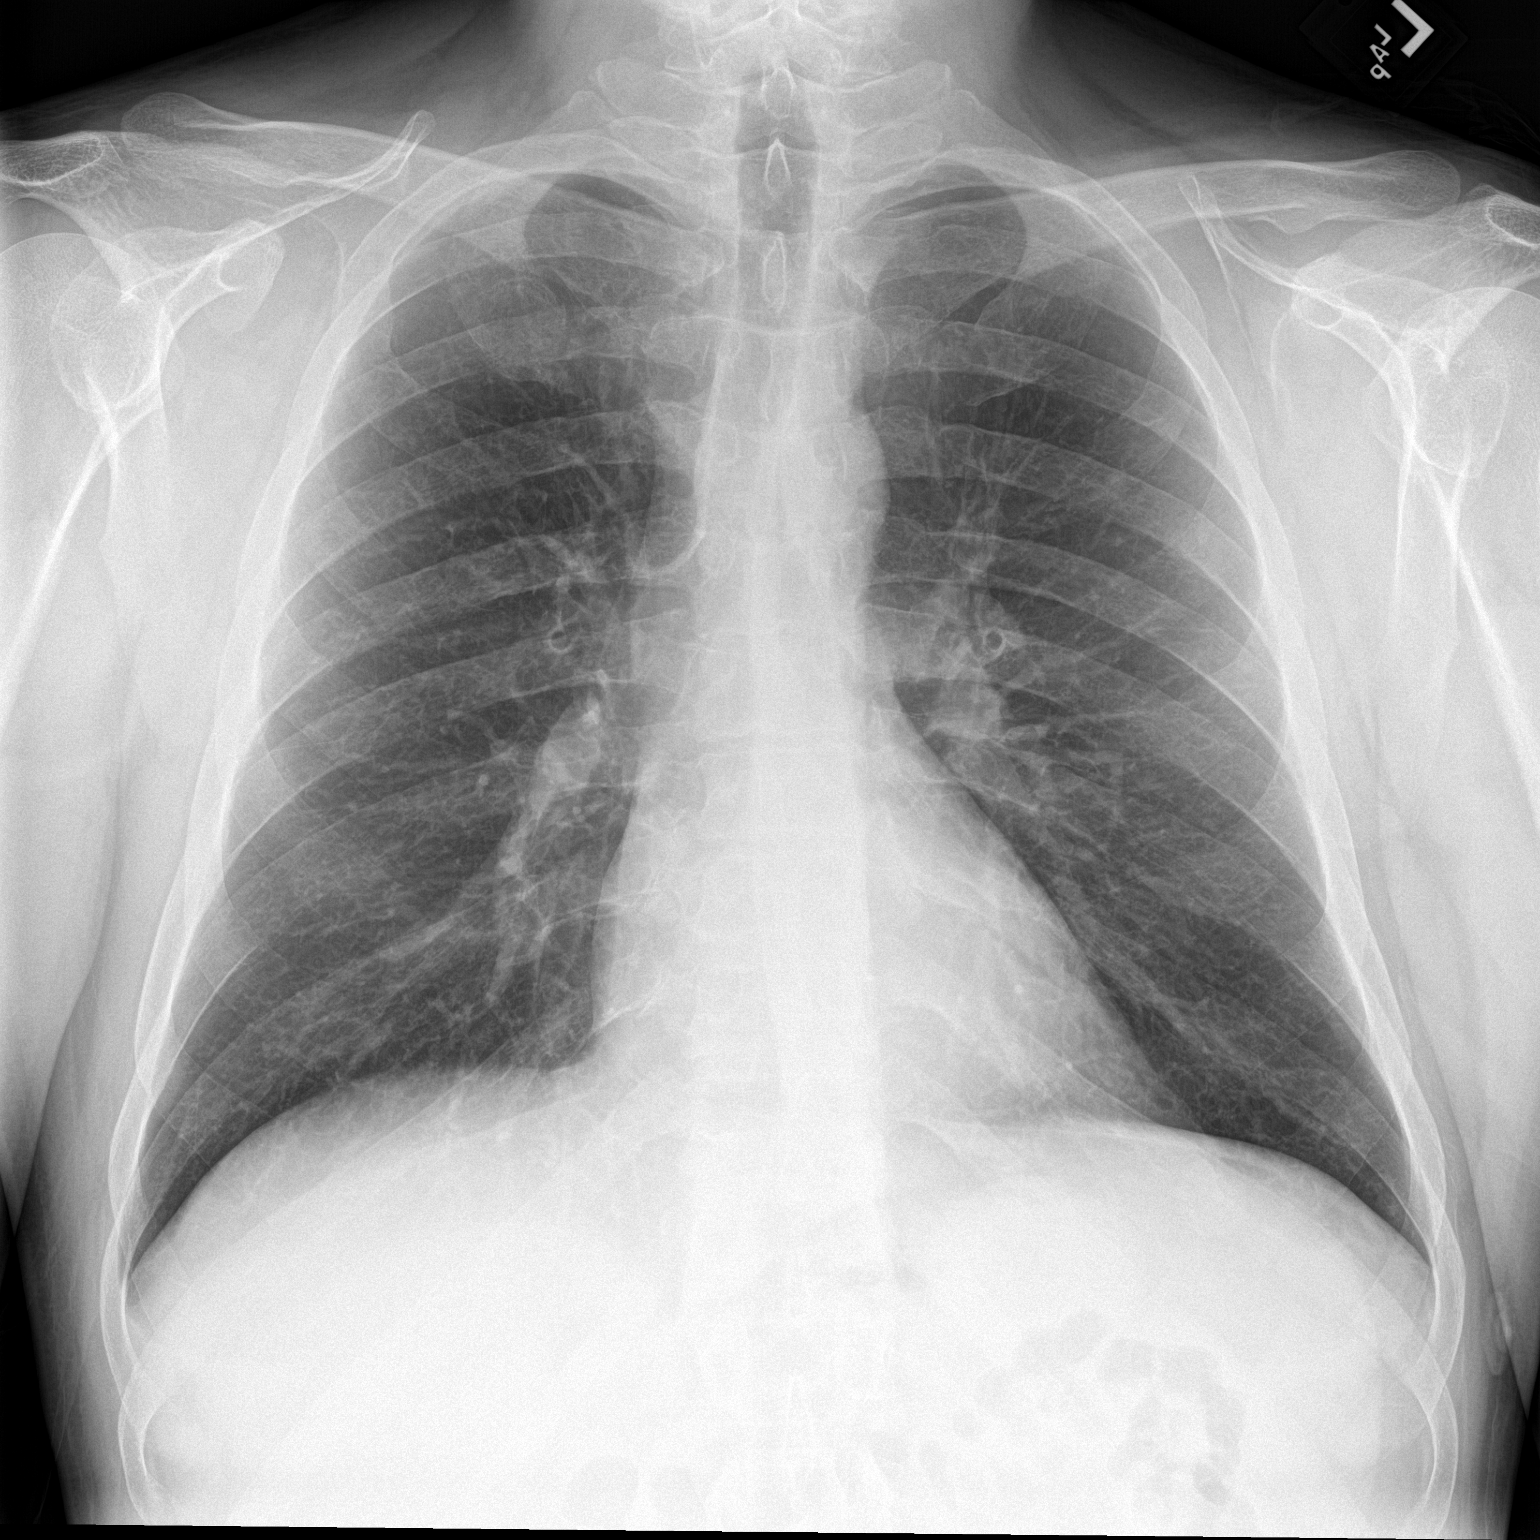

[chest lat]
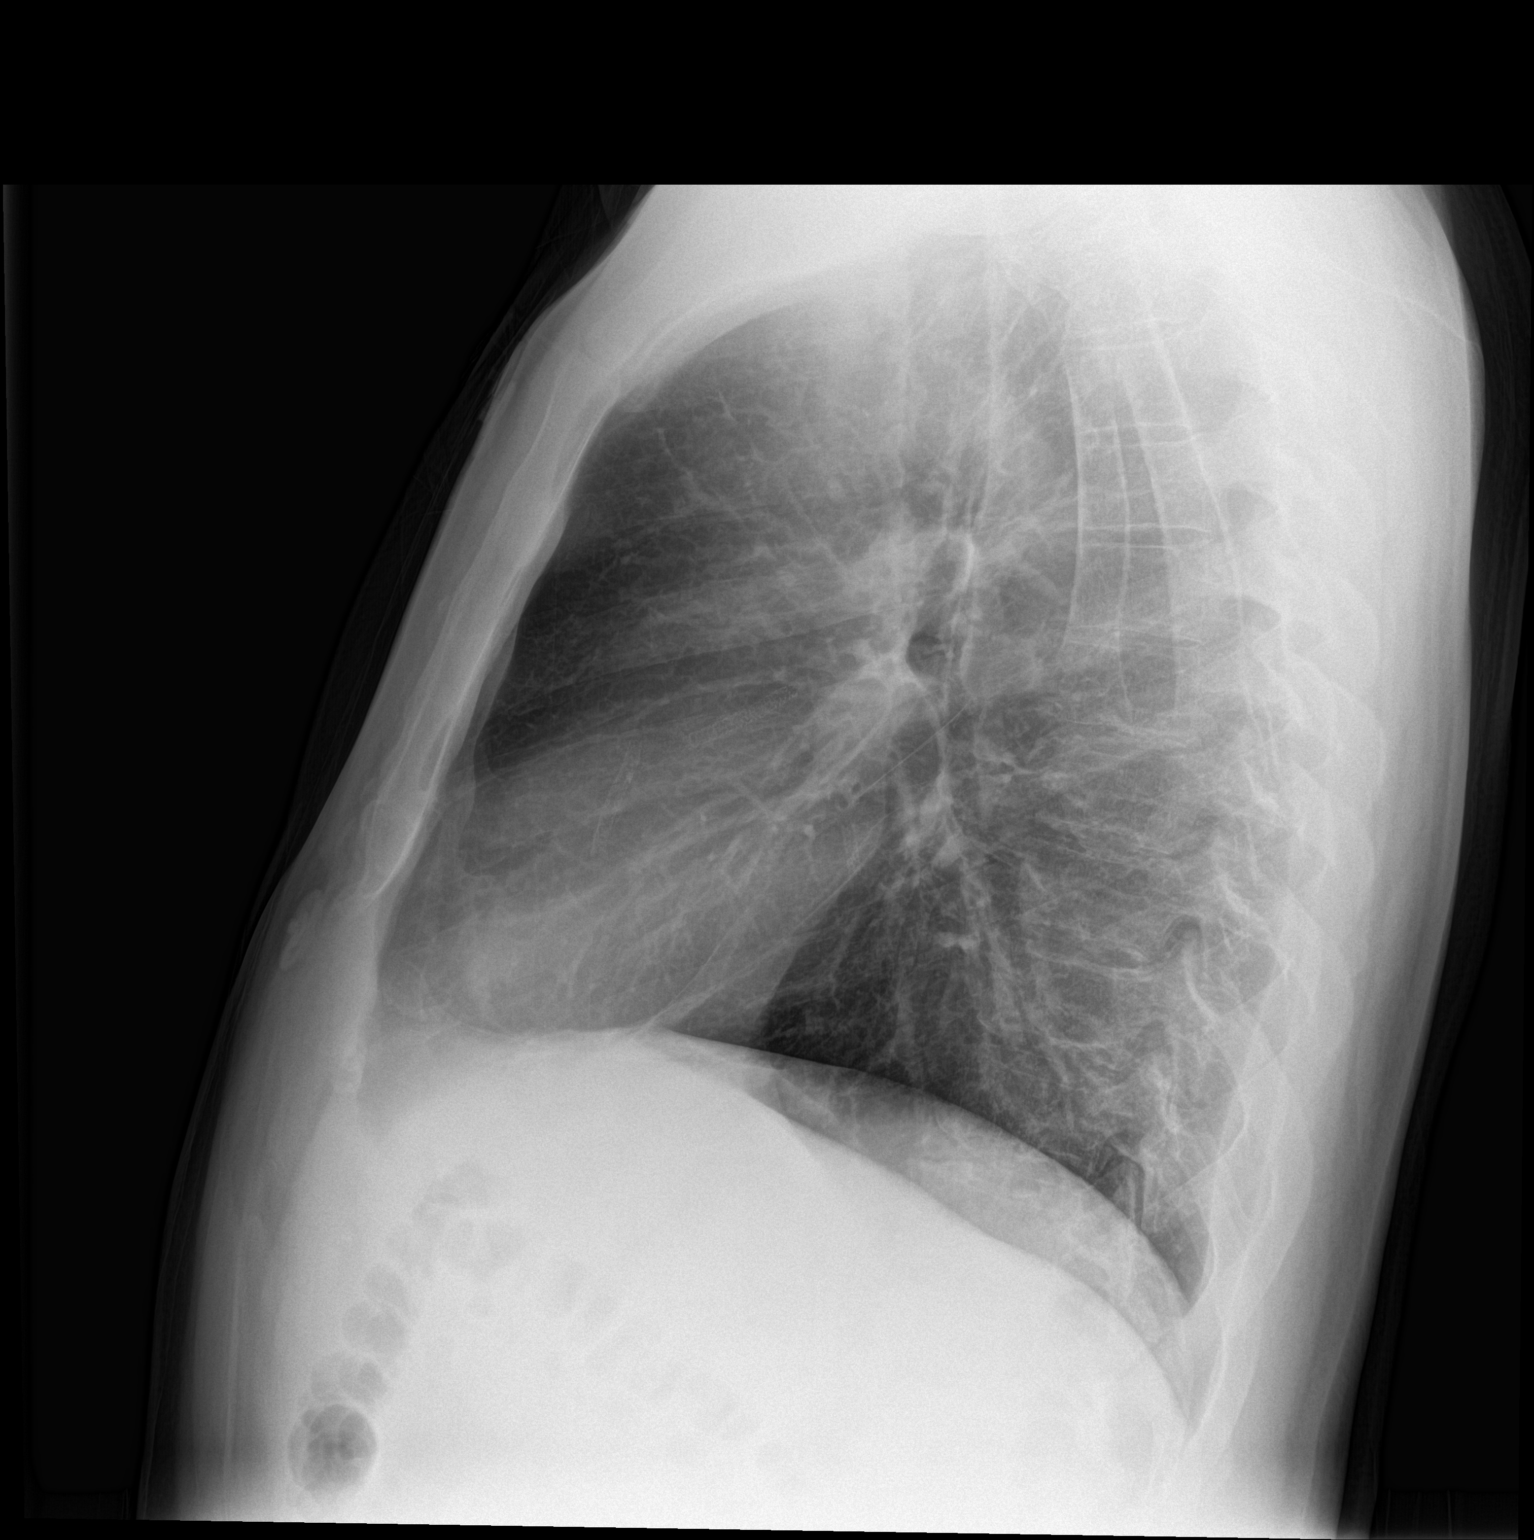

[2 of 2 positions shown; findings below may reference images not displayed]

FINDINGS: There is no edema or consolidation. The heart size and pulmonary
vascularity are normal. No adenopathy. No pneumothorax. No bone
lesions.
IMPRESSION: No edema or consolidation.

## 2018-02-19 DIAGNOSIS — K219 Gastro-esophageal reflux disease without esophagitis: Secondary | ICD-10-CM | POA: Diagnosis not present

## 2018-02-19 DIAGNOSIS — N189 Chronic kidney disease, unspecified: Secondary | ICD-10-CM | POA: Diagnosis not present

## 2018-02-19 DIAGNOSIS — G2581 Restless legs syndrome: Secondary | ICD-10-CM | POA: Diagnosis not present

## 2018-02-19 DIAGNOSIS — M1990 Unspecified osteoarthritis, unspecified site: Secondary | ICD-10-CM | POA: Diagnosis not present

## 2018-02-19 DIAGNOSIS — M542 Cervicalgia: Secondary | ICD-10-CM | POA: Diagnosis not present

## 2018-02-19 DIAGNOSIS — Z823 Family history of stroke: Secondary | ICD-10-CM | POA: Diagnosis not present

## 2018-02-19 DIAGNOSIS — Z992 Dependence on renal dialysis: Secondary | ICD-10-CM | POA: Diagnosis not present

## 2018-02-19 DIAGNOSIS — I2510 Atherosclerotic heart disease of native coronary artery without angina pectoris: Secondary | ICD-10-CM | POA: Diagnosis not present

## 2018-02-19 DIAGNOSIS — I252 Old myocardial infarction: Secondary | ICD-10-CM | POA: Diagnosis not present

## 2018-02-19 DIAGNOSIS — Z79899 Other long term (current) drug therapy: Secondary | ICD-10-CM | POA: Diagnosis not present

## 2018-02-19 DIAGNOSIS — E1122 Type 2 diabetes mellitus with diabetic chronic kidney disease: Secondary | ICD-10-CM | POA: Diagnosis not present

## 2018-02-19 DIAGNOSIS — I129 Hypertensive chronic kidney disease with stage 1 through stage 4 chronic kidney disease, or unspecified chronic kidney disease: Secondary | ICD-10-CM | POA: Diagnosis not present

## 2018-02-19 DIAGNOSIS — R079 Chest pain, unspecified: Secondary | ICD-10-CM | POA: Diagnosis not present

## 2018-02-19 DIAGNOSIS — R51 Headache: Secondary | ICD-10-CM | POA: Diagnosis not present

## 2018-02-25 DIAGNOSIS — I10 Essential (primary) hypertension: Secondary | ICD-10-CM | POA: Diagnosis not present

## 2018-02-25 DIAGNOSIS — E7849 Other hyperlipidemia: Secondary | ICD-10-CM | POA: Diagnosis not present

## 2018-02-25 DIAGNOSIS — I2510 Atherosclerotic heart disease of native coronary artery without angina pectoris: Secondary | ICD-10-CM | POA: Diagnosis not present

## 2018-02-25 DIAGNOSIS — E119 Type 2 diabetes mellitus without complications: Secondary | ICD-10-CM | POA: Diagnosis not present

## 2018-02-25 DIAGNOSIS — N189 Chronic kidney disease, unspecified: Secondary | ICD-10-CM | POA: Diagnosis not present

## 2018-03-07 ENCOUNTER — Other Ambulatory Visit: Payer: Self-pay

## 2018-03-07 ENCOUNTER — Emergency Department: Payer: Medicare Other

## 2018-03-07 ENCOUNTER — Emergency Department
Admission: EM | Admit: 2018-03-07 | Discharge: 2018-03-07 | Disposition: A | Discharge status: Home / Self Care | Payer: Medicare Other | Attending: Emergency Medicine | Admitting: Emergency Medicine

## 2018-03-07 ENCOUNTER — Encounter: Payer: Self-pay | Admitting: Emergency Medicine

## 2018-03-07 DIAGNOSIS — E1122 Type 2 diabetes mellitus with diabetic chronic kidney disease: Secondary | ICD-10-CM | POA: Diagnosis not present

## 2018-03-07 DIAGNOSIS — N189 Chronic kidney disease, unspecified: Secondary | ICD-10-CM | POA: Insufficient documentation

## 2018-03-07 DIAGNOSIS — J45909 Unspecified asthma, uncomplicated: Secondary | ICD-10-CM | POA: Insufficient documentation

## 2018-03-07 DIAGNOSIS — R531 Weakness: Secondary | ICD-10-CM

## 2018-03-07 DIAGNOSIS — W19XXXA Unspecified fall, initial encounter: Secondary | ICD-10-CM | POA: Insufficient documentation

## 2018-03-07 DIAGNOSIS — Y939 Activity, unspecified: Secondary | ICD-10-CM | POA: Diagnosis not present

## 2018-03-07 DIAGNOSIS — Z743 Need for continuous supervision: Secondary | ICD-10-CM | POA: Diagnosis not present

## 2018-03-07 DIAGNOSIS — S0003XA Contusion of scalp, initial encounter: Secondary | ICD-10-CM | POA: Diagnosis not present

## 2018-03-07 DIAGNOSIS — J9811 Atelectasis: Secondary | ICD-10-CM | POA: Diagnosis not present

## 2018-03-07 DIAGNOSIS — I129 Hypertensive chronic kidney disease with stage 1 through stage 4 chronic kidney disease, or unspecified chronic kidney disease: Secondary | ICD-10-CM | POA: Insufficient documentation

## 2018-03-07 DIAGNOSIS — R069 Unspecified abnormalities of breathing: Secondary | ICD-10-CM | POA: Diagnosis not present

## 2018-03-07 DIAGNOSIS — Z7984 Long term (current) use of oral hypoglycemic drugs: Secondary | ICD-10-CM | POA: Diagnosis not present

## 2018-03-07 DIAGNOSIS — S0990XA Unspecified injury of head, initial encounter: Secondary | ICD-10-CM

## 2018-03-07 DIAGNOSIS — Z7902 Long term (current) use of antithrombotics/antiplatelets: Secondary | ICD-10-CM | POA: Diagnosis not present

## 2018-03-07 DIAGNOSIS — R55 Syncope and collapse: Secondary | ICD-10-CM | POA: Diagnosis not present

## 2018-03-07 DIAGNOSIS — Y999 Unspecified external cause status: Secondary | ICD-10-CM | POA: Diagnosis not present

## 2018-03-07 DIAGNOSIS — R4182 Altered mental status, unspecified: Secondary | ICD-10-CM | POA: Insufficient documentation

## 2018-03-07 DIAGNOSIS — Y929 Unspecified place or not applicable: Secondary | ICD-10-CM | POA: Diagnosis not present

## 2018-03-07 DIAGNOSIS — Z7982 Long term (current) use of aspirin: Secondary | ICD-10-CM | POA: Diagnosis not present

## 2018-03-07 DIAGNOSIS — R000 Tachycardia, unspecified: Secondary | ICD-10-CM | POA: Diagnosis not present

## 2018-03-07 DIAGNOSIS — R0902 Hypoxemia: Secondary | ICD-10-CM | POA: Diagnosis not present

## 2018-03-07 DIAGNOSIS — S0012XA Contusion of left eyelid and periocular area, initial encounter: Secondary | ICD-10-CM | POA: Diagnosis not present

## 2018-03-07 DIAGNOSIS — I2510 Atherosclerotic heart disease of native coronary artery without angina pectoris: Secondary | ICD-10-CM | POA: Insufficient documentation

## 2018-03-07 DIAGNOSIS — S199XXA Unspecified injury of neck, initial encounter: Secondary | ICD-10-CM | POA: Diagnosis not present

## 2018-03-07 LAB — URINALYSIS, COMPLETE (UACMP) WITH MICROSCOPIC
Bacteria, UA: NONE SEEN
Bilirubin Urine: NEGATIVE
Glucose, UA: 150 mg/dL — AB
HGB URINE DIPSTICK: NEGATIVE
Ketones, ur: 5 mg/dL — AB
LEUKOCYTES UA: NEGATIVE
Nitrite: NEGATIVE
Protein, ur: NEGATIVE mg/dL
Specific Gravity, Urine: 1.016 (ref 1.005–1.030)
pH: 6 (ref 5.0–8.0)

## 2018-03-07 LAB — TROPONIN I: Troponin I: <0.03 ng/mL (ref <0.03)

## 2018-03-07 LAB — CBC WITH DIFFERENTIAL/PLATELET
Abs Immature Granulocytes: 0.07 10*3/uL (ref 0.00–0.07)
Basophils Absolute: 0 10*3/uL (ref 0.0–0.1)
Basophils Relative: 0 %
Eosinophils Absolute: 0.1 10*3/uL (ref 0.0–0.5)
Eosinophils Relative: 2 %
HCT: 40.1 % (ref 39.0–52.0)
Hemoglobin: 12.8 g/dL — ABNORMAL LOW (ref 13.0–17.0)
Immature Granulocytes: 1 %
Lymphocytes Relative: 23 %
Lymphs Abs: 1.2 10*3/uL (ref 0.7–4.0)
MCH: 29.7 pg (ref 26.0–34.0)
MCHC: 31.9 g/dL (ref 30.0–36.0)
MCV: 93 fL (ref 80.0–100.0)
Monocytes Absolute: 0.3 10*3/uL (ref 0.1–1.0)
Monocytes Relative: 6 %
Neutro Abs: 3.6 10*3/uL (ref 1.7–7.7)
Neutrophils Relative %: 68 %
Platelets: 160 10*3/uL (ref 150–400)
RBC: 4.31 MIL/uL (ref 4.22–5.81)
RDW: 14.8 % (ref 11.5–15.5)
WBC: 5.3 10*3/uL (ref 4.0–10.5)
nRBC: 0 % (ref 0.0–0.2)

## 2018-03-07 LAB — URINE DRUG SCREEN, QUALITATIVE (ARMC ONLY)
Amphetamines, Ur Screen: NOT DETECTED
Barbiturates, Ur Screen: NOT DETECTED
Benzodiazepine, Ur Scrn: NOT DETECTED
CANNABINOID 50 NG, UR ~~LOC~~: NOT DETECTED
Cocaine Metabolite,Ur ~~LOC~~: NOT DETECTED
MDMA (Ecstasy)Ur Screen: NOT DETECTED
Methadone Scn, Ur: NOT DETECTED
Opiate, Ur Screen: NOT DETECTED
Phencyclidine (PCP) Ur S: NOT DETECTED
Tricyclic, Ur Screen: POSITIVE — AB

## 2018-03-07 LAB — COMPREHENSIVE METABOLIC PANEL
ALBUMIN: 3.7 g/dL (ref 3.5–5.0)
ALT: 21 U/L (ref 0–44)
AST: 22 U/L (ref 15–41)
Alkaline Phosphatase: 98 U/L (ref 38–126)
Anion gap: 11 (ref 5–15)
BILIRUBIN TOTAL: 0.4 mg/dL (ref 0.3–1.2)
BUN: 9 mg/dL (ref 6–20)
CO2: 22 mmol/L (ref 22–32)
Calcium: 8.4 mg/dL — ABNORMAL LOW (ref 8.9–10.3)
Chloride: 105 mmol/L (ref 98–111)
Creatinine, Ser: 0.88 mg/dL (ref 0.61–1.24)
GFR calc Af Amer: >60 mL/min (ref >60)
GFR calc non Af Amer: >60 mL/min (ref >60)
Glucose, Bld: 223 mg/dL — ABNORMAL HIGH (ref 70–99)
Potassium: 4.2 mmol/L (ref 3.5–5.1)
Sodium: 138 mmol/L (ref 135–145)
TOTAL PROTEIN: 6.3 g/dL — AB (ref 6.5–8.1)

## 2018-03-07 LAB — ACETAMINOPHEN LEVEL: Acetaminophen (Tylenol), Serum: <10 ug/mL — ABNORMAL LOW (ref 10–30)

## 2018-03-07 LAB — PROTIME-INR
INR: 1
Prothrombin Time: 13.1 seconds (ref 11.4–15.2)

## 2018-03-07 LAB — CK: Total CK: 65 U/L (ref 49–397)

## 2018-03-07 LAB — AMMONIA: Ammonia: 31 umol/L (ref 9–35)

## 2018-03-07 LAB — ETHANOL: Alcohol, Ethyl (B): <10 mg/dL (ref <10)

## 2018-03-07 LAB — SALICYLATE LEVEL: Salicylate Lvl: <7 mg/dL (ref 2.8–30.0)

## 2018-03-07 LAB — VALPROIC ACID LEVEL: Valproic Acid Lvl: 71 ug/mL (ref 50.0–100.0)

## 2018-03-07 MED ORDER — SODIUM CHLORIDE 0.9 % IV BOLUS
500.0000 mL | Freq: Once | INTRAVENOUS | Status: AC
  Administered 2018-03-07: 500 mL via INTRAVENOUS

## 2018-03-07 NOTE — ED Notes (Signed)
PT ambulatory upon discharge, verbalizes d/c understanding and follow up. PT in NAD. PT signed hard copy of consent for discharge due to computer malfnx. See chart in medical records

## 2018-03-07 NOTE — ED Notes (Signed)
Family at bedside. 

## 2018-03-07 NOTE — ED Notes (Signed)
Pt up and ambulatory with steady gait noted, MD aware. PT drinking at this time, NAD noted

## 2018-03-07 NOTE — ED Triage Notes (Addendum)
PT to ED via EMS from home with c/o generalized weakness, syncope, and abnormal gait xfew days. PT has bruising noted to LFT eye from fall this am. Pt taking Plavix .Per EMS cbg 220, hr 105, all other VS stable. T&VG1

## 2018-03-07 NOTE — ED Notes (Signed)
Patient transported to CT 

## 2018-03-07 NOTE — ED Notes (Signed)
Pt speech appears garbled , states xfew days

## 2018-03-07 NOTE — Discharge Instructions (Addendum)
Be very careful at home do not drink or drive, take your medications as prescribed, do not to rapidly stand up and walk about.  Return to the emergency room for any new or worrisome symptoms

## 2018-03-07 NOTE — ED Provider Notes (Addendum)
[facility] Emergency Department Provider Note  ____________________________________________   I have reviewed the triage vital signs and the nursing notes. Where available I have reviewed prior notes and, if possible and indicated, outside hospital notes.    HISTORY  Chief Complaint Weakness    HPI Michael Schmitt is a 47 y.o. male with a complicated history of significant mental illness, suicidal thoughts in the past, depression, chronic neck pain, opiate and benzo abuse in the past etc.  Patient presents today stating that he feels unknown steady on his feet.  States he is fallen several times since Monday.  Patient used to be on benzos he states that they went down to 1 pill a day about a month ago and he stopped taking them after that.  He states his been about a month since he had any.  He does not feel that he is in withdrawal from anything.  Denies any alcohol abuse or opiate abuse.  Denies any fever or chills.  Denies any abdominal pain, denies headache, denies any focal numbness or weakness.  He states he just feels unsteady on his feet and has fallen a lot.  Denies syncope.  Denies chest pain.  States he did hit his head at least once it may be more than that.  Most recent fall was this morning.  He states he did not pass out.  EMS reports normal blood sugar.  He denies any significant pain or injury although he does have bruising around his eyes.  He states that he is suffering from severe headache.  Not give any further history about why he has been feeling unwell.  His review of systems is generally negative.  Denies chest pain or shortness of breath, patient does have a history of CAD with stents placed in January 2018.  Most recent echo showed EF of 45 to 50% in January of that year as well.   Past Medical History:  Diagnosis Date  . Anxiety   . Anxiety   . Arthritis    cerv. stenosis, spondylosis, HNP- lower back , has been followed in pain clinic,  has  had injection s in cerv. area  . Blood dyscrasia    told that when he was young he was a" free bleeder"  . CAD (coronary artery disease)   . Cervical spondylosis without myelopathy 07/24/2014  . Cervicogenic headache 07/24/2014  . Chronic kidney disease    renal calculi- passed spontaneously  . Depression   . Diabetes mellitus without complication ([place])   . Fatty liver   . GERD (gastroesophageal reflux disease)   . Headache(784.0)   . Hyperlipidemia   . Hypertension   . Mental disorder   . MI, old   . RLS (restless legs syndrome)    detected on sleep study  . Shortness of breath   . Ventricular fibrillation ([place]) 04/05/2016    Patient Active Problem List   Diagnosis Date Noted  . Personality disorder ([place]) 12/31/2017  . MDD (major depressive disorder), recurrent episode, severe ([place]) 11/20/2017  . Dysthymia 10/26/2017  . Severe recurrent major depression without psychotic features ([place]) 03/12/2017  . Generalized anxiety disorder 02/05/2017  . Chronic neck pain (Primary Area of Pain) (Left) 11/22/2016  . Cervical facet syndrome (Left) 11/22/2016  . Cervical radiculitis (Left) 11/22/2016  . History of cervical spinal surgery 11/22/2016  . Cervical spondylosis 11/22/2016  . Chronic shoulder pain (Secondary Area of Pain) (Left) 11/22/2016  . Arthropathy of shoulder (Left) 11/22/2016  .  Chronic low back pain [facility] Area of Pain) (Bilateral) (L>R) 11/22/2016  . Lumbar facet syndrome (Bilateral) (L>R) 11/22/2016  . Chronic pain syndrome 11/21/2016  . Long term prescription benzodiazepine use 11/21/2016  . Opiate use 11/21/2016  . Uncontrolled type 2 diabetes mellitus without complication, without long-term current use of insulin ([place]) 10/19/2016  . Chronic Suicidal ideation 09/15/2016  . Coronary artery disease 09/12/2016  . HTN (hypertension) 06/26/2016  . GERD (gastroesophageal reflux disease) 06/26/2016  . Alcohol use disorder, severe, 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[place])  06/26/2016  . Benzodiazepine abuse ([place]) 06/11/2016  . Sedative, hypnotic or anxiolytic use disorder, severe, dependence ([place]) 05/12/2016  . Ventricular fibrillation ([place]) 04/05/2016  . Combined hyperlipidemia 01/05/2016  . Cervical spondylosis without myelopathy 07/24/2014  . Severe episode of recurrent major depressive disorder, without psychotic features ([place]) 04/15/2013  . Tobacco use disorder 10/20/2010  . Asthma 10/20/2010    Past Surgical History:  Procedure Laterality Date  . ANTERIOR CERVICAL DECOMP/DISCECTOMY FUSION  11/13/2011   Procedure: ANTERIOR CERVICAL DECOMPRESSION/DISCECTOMY FUSION 1 LEVEL;  Surgeon: [name], MD;  Location: [place] [facility];  Service: Neurosurgery;  Laterality: N/A;  Anterior Cervical Decompression/discectomy Fusion. Cervical three-four.  Marland Kitchen CARDIAC CATHETERIZATION N/A 01/01/2015   Procedure: Left Heart Cath and Coronary Angiography;  Surgeon: [name], MD;  Location: [place] CV LAB;  Service: Cardiovascular;  Laterality: N/A;  . CARDIAC CATHETERIZATION N/A 04/05/2016   Procedure: Left Heart Cath and Coronary Angiography;  Surgeon: Belva Crome, MD;  Location: [place] CV LAB;  Service: Cardiovascular;  Laterality: N/A;  . CARDIAC CATHETERIZATION N/A 04/05/2016   Procedure: Coronary Stent Intervention;  Surgeon: Belva Crome, MD;  Location: [place] CV LAB;  Service: Cardiovascular;  Laterality: N/A;  . CARDIAC CATHETERIZATION N/A 04/05/2016   Procedure: Intravascular Ultrasound/IVUS;  Surgeon: Belva Crome, MD;  Location: [place] CV LAB;  Service: Cardiovascular;  Laterality: N/A;  . NASAL SINUS SURGERY     2005    Prior to Admission medications   Medication Sig Start Date End Date Taking? Authorizing Provider  aspirin 81 MG EC tablet Take 1 tablet (81 mg total) by mouth daily. For heart health 11/21/17   Lindell Spar I, NP  atorvastatin (LIPITOR) 80 MG tablet Take 1 tablet (80 mg total) by mouth daily at 6 PM. For high  cholesterol Patient taking differently: Take 80 mg by mouth daily. For high cholesterol 11/21/17   Lindell Spar I, NP  carbamazepine (TEGRETOL) 200 MG tablet Take 1 tablet (200 mg total) by mouth daily. 01/08/18   Davonna Belling, MD  carvedilol (COREG) 6.25 MG tablet Take 1 tablet (6.25 mg total) by mouth 2 (two) times daily with a meal. For high blood pressure 11/21/17   Nwoko, Herbert Pun I, NP  chlorproMAZINE (THORAZINE) 200 MG tablet Take 400 mg by mouth at bedtime.  12/13/17   [provider]  clonazePAM (KLONOPIN) 1 MG tablet Take 1 mg by mouth 3 (three) times daily.  12/28/17   [provider]  clopidogrel (PLAVIX) 75 MG tablet Take 1 tablet (75 mg total) by mouth daily. For blood clot prevention 11/22/17   Lindell Spar I, NP  divalproex (DEPAKOTE) 500 MG DR tablet Take 1 tablet (500 mg total) by mouth at bedtime. 01/08/18   Davonna Belling, MD  doxepin (SINEQUAN) 50 MG capsule Take 1 capsule (50 mg total) by mouth at bedtime. 01/08/18   Davonna Belling, MD  gabapentin (NEURONTIN) 300 MG capsule Take 1 capsule (300 mg total) by mouth  3 (three) times daily. For agitation/neuropathic pain Patient not taking: Reported on 01/04/2018 11/21/17   Lindell Spar I, NP  gabapentin (NEURONTIN) 600 MG tablet Take 600 mg by mouth 3 (three) times daily.  12/28/17   [provider]  lurasidone (LATUDA) 80 MG TABS tablet Take 1 tablet (80 mg total) by mouth at bedtime. 01/08/18   Davonna Belling, MD  Melatonin 3 MG CAPS Take 9 mg by mouth at bedtime.    [provider]  metFORMIN (GLUCOPHAGE) 500 MG tablet Take 1 tablet (500 mg total) by mouth 2 (two) times daily with a meal. For diabetes management 11/21/17   Lindell Spar I, NP  mupirocin cream (BACTROBAN) 2 % Apply topically 2 (two) times daily. Patient not taking: Reported on 01/04/2018 11/21/17   Lindell Spar I, NP  nicotine (NICODERM CQ - DOSED IN MG/24 HOURS) 21 mg/24hr patch Place 1 patch (21 mg total) onto the skin daily.  (May buy from over the counter): For smoking cessation Patient not taking: Reported on 01/04/2018 11/22/17   Lindell Spar I, NP  nitroGLYCERIN (NITROSTAT) 0.4 MG SL tablet Place 1 tablet (0.4 mg total) under the tongue every 5 (five) minutes x 3 doses as needed. For chest pain Patient not taking: Reported on 01/04/2018 11/21/17 11/21/18  Lindell Spar I, NP  Oxcarbazepine (TRILEPTAL) 300 MG tablet Take 1 tablet (300 mg total) by mouth at bedtime. For mood stabilization 01/08/18   Davonna Belling, MD  QUEtiapine (SEROQUEL) 300 MG tablet Take 600 mg by mouth at bedtime.  11/30/17   [provider]  traZODone (DESYREL) 100 MG tablet Take 1 tablet (100 mg total) by mouth at bedtime. 01/08/18   Davonna Belling, MD  TRINTELLIX 10 MG TABS tablet Take 1 tablet (10 mg total) by mouth daily. For depression Patient not taking: Reported on 01/04/2018 11/21/17   Lindell Spar I, NP    Allergies Prednisone; Hydrocodone; Trazodone and nefazodone; and Varenicline  Family History  Problem Relation Age of Onset  . Prostate cancer Father   . Hypertension Mother   . Kidney Stones Mother   . Anxiety disorder Mother   . Depression Mother   . COPD Sister   . Hypertension Sister   . Diabetes Sister   . Depression Sister   . Anxiety disorder Sister   . Seizures Sister   . ADD / ADHD Son   . ADD / ADHD Daughter     Social History Social History   Tobacco Use  . Smoking status: Current Every Day Smoker    Packs/day: 2.00    Years: 17.00    Pack years: 34.00    Types: Cigarettes, Cigars  . Smokeless tobacco: Former Systems developer  . Tobacco comment: occassional snuff   Substance Use Topics  . Alcohol use: No    Alcohol/week: 0.0 standard drinks    Comment: last drinked in 3 months.   . Drug use: No    Review of Systems Constitutional: No fever/chills Eyes: No visual changes. ENT: No sore throat. No stiff neck no neck pain Cardiovascular: Denies chest pain. Respiratory: Denies shortness of  breath.  Does have slight cough he states. Gastrointestinal:   no vomiting.  No diarrhea.  No constipation. Genitourinary: Negative for dysuria. Musculoskeletal: Negative lower extremity swelling Skin: Negative for rash. Neurological: Negative for severe headaches, focal weakness or numbness.   ____________________________________________   PHYSICAL EXAM:  VITAL SIGNS: ED Triage Vitals  Enc Vitals Group     BP 03/07/18 0819 126/89  Pulse Rate 03/07/18 0817 100     Resp 03/07/18 0813 16     Temp 03/07/18 0817 97.9 F (36.6 C)     Temp Source 03/07/18 0813 Oral     SpO2 03/07/18 0817 96 %     Weight 03/07/18 0814 203 lb (92.1 kg)     Height 03/07/18 0814 6\' 2"  (1.88 m)     Head Circumference --      Peak Flow --      Pain Score 03/07/18 0814 0     Pain Loc --      Pain Edu? --      Excl. in [place]? --     Constitutional: Slightly somnolent but answers questions and is awake with his eyes open, he is actually oriented x3 he knows the date knows where he is knows his name.  Very vague historian unclear how much of this is due to acute process.. Eyes: Conjunctivae are normal Head: There is bruising around the left eye with no evidence of ocular involvement or hyphema, slight abrasion noted there as well.  Some of the bruising appears to be old HEENT: No congestion/rhinnorhea. Mucous membranes are quite dry.  Oropharynx non-erythematous Neck:   Nontender with no meningismus, no masses, no stridor Cardiovascular: Normal rate, regular rhythm. Grossly normal heart sounds.  Good peripheral circulation. Respiratory: Normal respiratory effort.  No retractions. Lungs CTAB. Abdominal: Soft and nontender. No distention. No guarding no rebound Back:  There is no focal tenderness or step off.  there is no midline tenderness there are no lesions noted. there is no CVA tenderness Musculoskeletal: No lower extremity tenderness, no upper extremity tenderness. No joint effusions, no DVT signs  strong distal pulses no edema Neurologic: He is somewhat slow to respond is difficult to tell if there is dysarthria or a an expressive aphasia, otherwise, cranial nerves are intact he has intact sensation and strength in all extremities no obvious cerebellar lesions Skin:  Skin is warm, dry and intact. No rash noted. Psychiatric: Mood and affect are normal. Speech and behavior are normal.  ____________________________________________   LABS (all labs ordered are listed, but only abnormal results are displayed)  Labs Reviewed  AMMONIA  URINALYSIS, COMPLETE (UACMP) WITH MICROSCOPIC  URINE DRUG SCREEN, QUALITATIVE ([facility] ONLY)  COMPREHENSIVE METABOLIC PANEL  CBC WITH DIFFERENTIAL/PLATELET  PROTIME-INR  ETHANOL  ACETAMINOPHEN LEVEL  SALICYLATE LEVEL  TROPONIN I  CK  CBG MONITORING, ED    Pertinent labs  results that were available during my care of the patient were reviewed by me and considered in my medical decision making (see chart for details). ____________________________________________  EKG  I personally interpreted any EKGs ordered by me or triage Rate 99 bpm, baseline artifact limits interpretation, I favor most likely this is a sinus rhythm, no acute ST elevation or depression normal axis ____________________________________________  RADIOLOGY  Pertinent labs & imaging results that were available during my care of the patient were reviewed by me and considered in my medical decision making (see chart for details). If possible, patient and/or family made aware of any abnormal findings.  No results found. ____________________________________________    PROCEDURES  Procedure(s) performed: None  Procedures  Critical Care performed: None  ____________________________________________   INITIAL IMPRESSION / ASSESSMENT AND PLAN / ED COURSE  Pertinent labs & imaging results that were available during my care of the patient were reviewed by me and considered in my  medical decision making (see chart for details).  Patient here with frequent falls  at home, unclear exactly what his baseline there is significant medical problems associated with this patient but also significant mental health issues.  As well as substance abuse issues.  Difficult to tease out immediately what may be contributing to his generalized feelings of weakness.  I do not see any obvious focality to his exam.  He does have clear head injury, and is supposed to be and may actually be taking Plavix, will obtain CT scan of the head face and neck as I am not 100% sure I can clear his C-spine even though it is nontender, we will obtain blood work including Depakote level, EtOH aspirin and Tylenol levels as we do not know if the patient is overdosed although he denies it, he does have a history of suicidal thoughts but he states he is not suicidal at this time.  Patient could be in mild withdrawal from any of a number of different things we will send a urine drug screen and watch him closely, he does appear to be dehydrated, we will give him IV fluid.  Vital signs are stable at this time we will continue to watch him closely.  ----------------------------------------- 10:14 AM on 03/07/2018 -----------------------------------------  T scan of the head is negative CT scan of the face is negative CT scan of the cervical spine is negative urinalysis is reassuring UDS is unchanged from baseline ammonia level is reassuring CMP is reassuring CBC is reassuring INR is reassuring ethanol is reassuring acetaminophen and salicylate levels are reassuring Depakote level is reassuring chest x-ray is reassuring on exam remains nonfocal.  NIH stroke scale is 0.  I did have the patient ambulate, he seems to be able to walk with no difficulty he states that he just needed a little IV fluid and some rest may be.  Nothing to suggest cerebellar infarct.  He walks without assistance has a negative Romberg and again repeat  neurologic exams have been normal thus far.  No evidence of significant toxidrome he is awake and at his baseline.  Unclear why he fell.  Denies alcohol last night, does not appear to be in withdrawal at this time.  Did screen him for suicide and he has no SI or HI he is not hallucinating    ____________________________________________   FINAL CLINICAL IMPRESSION(S) / ED DIAGNOSES  Final diagnoses:  Weakness      This chart was dictated using voice recognition software.  Despite best efforts to proofread,  errors can occur which can change meaning.      Schuyler Amor, MD 03/07/18 2536    Schuyler Amor, MD 03/07/18 1015    Schuyler Amor, MD 03/07/18 1016

## 2018-03-09 ENCOUNTER — Emergency Department: Payer: Medicare Other

## 2018-03-09 ENCOUNTER — Encounter: Payer: Self-pay | Admitting: Emergency Medicine

## 2018-03-09 ENCOUNTER — Other Ambulatory Visit: Payer: Self-pay

## 2018-03-09 ENCOUNTER — Emergency Department
Admission: EM | Admit: 2018-03-09 | Discharge: 2018-03-09 | Disposition: A | Discharge status: Home / Self Care | Payer: Medicare Other | Attending: Emergency Medicine | Admitting: Emergency Medicine

## 2018-03-09 DIAGNOSIS — F1021 Alcohol dependence, in remission: Secondary | ICD-10-CM | POA: Insufficient documentation

## 2018-03-09 DIAGNOSIS — Z7902 Long term (current) use of antithrombotics/antiplatelets: Secondary | ICD-10-CM | POA: Insufficient documentation

## 2018-03-09 DIAGNOSIS — F17210 Nicotine dependence, cigarettes, uncomplicated: Secondary | ICD-10-CM | POA: Insufficient documentation

## 2018-03-09 DIAGNOSIS — F419 Anxiety disorder, unspecified: Secondary | ICD-10-CM | POA: Diagnosis not present

## 2018-03-09 DIAGNOSIS — I252 Old myocardial infarction: Secondary | ICD-10-CM | POA: Insufficient documentation

## 2018-03-09 DIAGNOSIS — I129 Hypertensive chronic kidney disease with stage 1 through stage 4 chronic kidney disease, or unspecified chronic kidney disease: Secondary | ICD-10-CM | POA: Insufficient documentation

## 2018-03-09 DIAGNOSIS — R000 Tachycardia, unspecified: Secondary | ICD-10-CM | POA: Diagnosis not present

## 2018-03-09 DIAGNOSIS — F329 Major depressive disorder, single episode, unspecified: Secondary | ICD-10-CM | POA: Insufficient documentation

## 2018-03-09 DIAGNOSIS — Z7982 Long term (current) use of aspirin: Secondary | ICD-10-CM | POA: Insufficient documentation

## 2018-03-09 DIAGNOSIS — R531 Weakness: Secondary | ICD-10-CM | POA: Diagnosis present

## 2018-03-09 DIAGNOSIS — I10 Essential (primary) hypertension: Secondary | ICD-10-CM | POA: Diagnosis not present

## 2018-03-09 DIAGNOSIS — E1122 Type 2 diabetes mellitus with diabetic chronic kidney disease: Secondary | ICD-10-CM | POA: Insufficient documentation

## 2018-03-09 DIAGNOSIS — R55 Syncope and collapse: Secondary | ICD-10-CM | POA: Insufficient documentation

## 2018-03-09 DIAGNOSIS — Z79899 Other long term (current) drug therapy: Secondary | ICD-10-CM | POA: Insufficient documentation

## 2018-03-09 DIAGNOSIS — N189 Chronic kidney disease, unspecified: Secondary | ICD-10-CM | POA: Diagnosis not present

## 2018-03-09 DIAGNOSIS — Z7984 Long term (current) use of oral hypoglycemic drugs: Secondary | ICD-10-CM | POA: Diagnosis not present

## 2018-03-09 DIAGNOSIS — I2510 Atherosclerotic heart disease of native coronary artery without angina pectoris: Secondary | ICD-10-CM | POA: Insufficient documentation

## 2018-03-09 DIAGNOSIS — R0602 Shortness of breath: Secondary | ICD-10-CM | POA: Diagnosis not present

## 2018-03-09 LAB — CBC
HCT: 43.7 % (ref 39.0–52.0)
Hemoglobin: 13.7 g/dL (ref 13.0–17.0)
MCH: 28.8 pg (ref 26.0–34.0)
MCHC: 31.4 g/dL (ref 30.0–36.0)
MCV: 92 fL (ref 80.0–100.0)
NRBC: 0 % (ref 0.0–0.2)
Platelets: 205 10*3/uL (ref 150–400)
RBC: 4.75 MIL/uL (ref 4.22–5.81)
RDW: 15.1 % (ref 11.5–15.5)
WBC: 7 10*3/uL (ref 4.0–10.5)

## 2018-03-09 LAB — BASIC METABOLIC PANEL
Anion gap: 10 (ref 5–15)
BUN: <5 mg/dL — ABNORMAL LOW (ref 6–20)
CO2: 21 mmol/L — ABNORMAL LOW (ref 22–32)
Calcium: 8.8 mg/dL — ABNORMAL LOW (ref 8.9–10.3)
Chloride: 104 mmol/L (ref 98–111)
Creatinine, Ser: 0.95 mg/dL (ref 0.61–1.24)
GFR calc Af Amer: >60 mL/min (ref >60)
GFR calc non Af Amer: >60 mL/min (ref >60)
Glucose, Bld: 223 mg/dL — ABNORMAL HIGH (ref 70–99)
Potassium: 3.7 mmol/L (ref 3.5–5.1)
Sodium: 135 mmol/L (ref 135–145)

## 2018-03-09 LAB — URINALYSIS, COMPLETE (UACMP) WITH MICROSCOPIC
Bacteria, UA: NONE SEEN
Bilirubin Urine: NEGATIVE
Hgb urine dipstick: NEGATIVE
Ketones, ur: NEGATIVE mg/dL
Nitrite: NEGATIVE
PH: 6 (ref 5.0–8.0)
Protein, ur: NEGATIVE mg/dL
Specific Gravity, Urine: 1.003 — ABNORMAL LOW (ref 1.005–1.030)

## 2018-03-09 LAB — TROPONIN I: Troponin I: <0.03 ng/mL (ref <0.03)

## 2018-03-09 MED ORDER — SODIUM CHLORIDE 0.9 % IV SOLN
1000.0000 mL | Freq: Once | INTRAVENOUS | Status: AC
  Administered 2018-03-09: 1000 mL via INTRAVENOUS

## 2018-03-09 NOTE — ED Notes (Signed)
Pt states out of bp med and dm meds x 1 week. That this pcp was suppose to call it in but he changed pharmacies. Advised pt to call his pcp to see where they called it in.

## 2018-03-09 NOTE — ED Triage Notes (Signed)
Has had multiple episodes of syncope over last week per pt.  Seems to happen when stands up per pt.  Was seen here 2 days ago for same per pt and worked up without dx.  Alert and oriented at this time. Bruising around left eye. Abrasions to left knee. Denies SI and psych issues.  C/o pain to neck.  This has been happening every day. VSS

## 2018-03-09 NOTE — ED Notes (Signed)
Pt up to the toilet with steady gait. 

## 2018-03-09 NOTE — ED Notes (Signed)
Attempted to educated pt on diabetes and drinking mountain dews and sweet teas with diabetes. Pt states he will drink what he likes.

## 2018-03-09 NOTE — ED Provider Notes (Signed)
[facility] Emergency Department Provider Note   ____________________________________________    I have reviewed the triage vital signs and the nursing notes.   HISTORY  Chief Complaint Weakness    HPI Michael Schmitt is a 47 y.o. male who presents today for complaints of weakness.  Patient seen in the emergency department 2 days ago for similar complaints had extensive work-up at that time.  Patient with extensive past medical history, no diabetes, also multiple psychiatric diseases as well as significant polysubstance abuse in the past.  He states that he was in [place] today and his legs got weak and he fell to the floor.  He denies injury.  He reports this is happened several times over the last week.  Denies chest pain or palpitations.  No shortness of breath.  No headache.  No neuro deficits.  No nausea vomiting or diaphoresis.  No abdominal pain.  No back pain saddle anesthesia or numbness.   Past Medical History:  Diagnosis Date  . Anxiety   . Anxiety   . Arthritis    cerv. stenosis, spondylosis, HNP- lower back , has been followed in pain clinic, has  had injection s in cerv. area  . Blood dyscrasia    told that when he was young he was a" free bleeder"  . CAD (coronary artery disease)   . Cervical spondylosis without myelopathy 07/24/2014  . Cervicogenic headache 07/24/2014  . Chronic kidney disease    renal calculi- passed spontaneously  . Depression   . Diabetes mellitus without complication ([place])   . Fatty liver   . GERD (gastroesophageal reflux disease)   . Headache(784.0)   . Hyperlipidemia   . Hypertension   . Mental disorder   . MI, old   . RLS (restless legs syndrome)    detected on sleep study  . Shortness of breath   . Ventricular fibrillation ([place]) 04/05/2016    Patient Active Problem List   Diagnosis Date Noted  . Personality disorder ([place]) 12/31/2017  . MDD (major depressive disorder), recurrent episode, severe  ([place]) 11/20/2017  . Dysthymia 10/26/2017  . Severe recurrent major depression without psychotic features ([place]) 03/12/2017  . Generalized anxiety disorder 02/05/2017  . Chronic neck pain (Primary Area of Pain) (Left) 11/22/2016  . Cervical facet syndrome (Left) 11/22/2016  . Cervical radiculitis (Left) 11/22/2016  . History of cervical spinal surgery 11/22/2016  . Cervical spondylosis 11/22/2016  . Chronic shoulder pain (Secondary Area of Pain) (Left) 11/22/2016  . Arthropathy of shoulder (Left) 11/22/2016  . Chronic low back pain [facility] Area of Pain) (Bilateral) (L>R) 11/22/2016  . Lumbar facet syndrome (Bilateral) (L>R) 11/22/2016  . Chronic pain syndrome 11/21/2016  . Long term prescription benzodiazepine use 11/21/2016  . Opiate use 11/21/2016  . Uncontrolled type 2 diabetes mellitus without complication, without long-term current use of insulin ([place]) 10/19/2016  . Chronic Suicidal ideation 09/15/2016  . Coronary artery disease 09/12/2016  . HTN (hypertension) 06/26/2016  . GERD (gastroesophageal reflux disease) 06/26/2016  . Alcohol use disorder, severe, 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[place]) 06/26/2016  . Benzodiazepine abuse ([place]) 06/11/2016  . Sedative, hypnotic or anxiolytic use disorder, severe, dependence ([place]) 05/12/2016  . Ventricular fibrillation ([place]) 04/05/2016  . Combined hyperlipidemia 01/05/2016  . Cervical spondylosis without myelopathy 07/24/2014  . Severe episode of recurrent major depressive disorder, without psychotic features ([place]) 04/15/2013  . Tobacco use disorder 10/20/2010  . Asthma 10/20/2010    Past Surgical History:  Procedure Laterality Date  . ANTERIOR  CERVICAL DECOMP/DISCECTOMY FUSION  11/13/2011   Procedure: ANTERIOR CERVICAL DECOMPRESSION/DISCECTOMY FUSION 1 LEVEL;  Surgeon: [name], MD;  Location: [place] NEURO ORS;  Service: Neurosurgery;  Laterality: N/A;  Anterior Cervical Decompression/discectomy Fusion. Cervical three-four.  Marland Kitchen CARDIAC CATHETERIZATION  N/A 01/01/2015   Procedure: Left Heart Cath and Coronary Angiography;  Surgeon: [name], MD;  Location: [place] CV LAB;  Service: Cardiovascular;  Laterality: N/A;  . CARDIAC CATHETERIZATION N/A 04/05/2016   Procedure: Left Heart Cath and Coronary Angiography;  Surgeon: Belva Crome, MD;  Location: [place] CV LAB;  Service: Cardiovascular;  Laterality: N/A;  . CARDIAC CATHETERIZATION N/A 04/05/2016   Procedure: Coronary Stent Intervention;  Surgeon: Belva Crome, MD;  Location: [place] CV LAB;  Service: Cardiovascular;  Laterality: N/A;  . CARDIAC CATHETERIZATION N/A 04/05/2016   Procedure: Intravascular Ultrasound/IVUS;  Surgeon: Belva Crome, MD;  Location: [place] CV LAB;  Service: Cardiovascular;  Laterality: N/A;  . NASAL SINUS SURGERY     2005    Prior to Admission medications   Medication Sig Start Date End Date Taking? Authorizing Provider  ARIPiprazole (ABILIFY) 30 MG tablet Take 30 mg by mouth at bedtime.    [provider]  aspirin 81 MG EC tablet Take 1 tablet (81 mg total) by mouth daily. For heart health 11/21/17   Lindell Spar I, NP  atorvastatin (LIPITOR) 80 MG tablet Take 1 tablet (80 mg total) by mouth daily at 6 PM. For high cholesterol Patient taking differently: Take 80 mg by mouth daily. For high cholesterol 11/21/17   Nwoko, Herbert Pun I, NP  busPIRone (BUSPAR) 15 MG tablet Take 15 mg by mouth 3 (three) times daily.    [provider]  carbamazepine (TEGRETOL) 200 MG tablet Take 1 tablet (200 mg total) by mouth daily. Patient taking differently: Take 200 mg by mouth 2 (two) times daily.  01/08/18   Davonna Belling, MD  carvedilol (COREG) 6.25 MG tablet Take 1 tablet (6.25 mg total) by mouth 2 (two) times daily with a meal. For high blood pressure 11/21/17   Nwoko, Herbert Pun I, NP  chlorproMAZINE (THORAZINE) 200 MG tablet Take 400 mg by mouth at bedtime.  12/13/17   [provider]  clonazePAM (KLONOPIN) 0.5 MG tablet Take 0.5 mg by  mouth 3 (three) times daily as needed for anxiety.    [provider]  clonazePAM (KLONOPIN) 1 MG tablet Take 1 mg by mouth 3 (three) times daily.  12/28/17   [provider]  clopidogrel (PLAVIX) 75 MG tablet Take 1 tablet (75 mg total) by mouth daily. For blood clot prevention 11/22/17   Lindell Spar I, NP  divalproex (DEPAKOTE) 500 MG DR tablet Take 1 tablet (500 mg total) by mouth at bedtime. Patient taking differently: Take 500 mg by mouth 2 (two) times daily.  01/08/18   Davonna Belling, MD  doxepin (SINEQUAN) 50 MG capsule Take 1 capsule (50 mg total) by mouth at bedtime. Patient taking differently: Take 100 mg by mouth at bedtime.  01/08/18   Davonna Belling, MD  escitalopram (LEXAPRO) 10 MG tablet Take 10 mg by mouth daily.    [provider]  gabapentin (NEURONTIN) 300 MG capsule Take 1 capsule (300 mg total) by mouth 3 (three) times daily. For agitation/neuropathic pain Patient taking differently: Take 900 mg by mouth 3 (three) times daily. For agitation/neuropathic pain 11/21/17   Lindell Spar I, NP  gabapentin (NEURONTIN) 600 MG tablet Take 600 mg by mouth 3 (three) times  daily.  12/28/17   [provider]  ibuprofen (ADVIL,MOTRIN) 800 MG tablet Take 2,400 mg by mouth 3 (three) times daily as needed.    [provider]  lamoTRIgine (LAMICTAL) 25 MG tablet Take 50 mg by mouth at bedtime.    [provider]  lithium carbonate (ESKALITH) 450 MG CR tablet Take 900 mg by mouth 2 (two) times daily.    [provider]  lurasidone (LATUDA) 80 MG TABS tablet Take 1 tablet (80 mg total) by mouth at bedtime. Patient not taking: Reported on 03/07/2018 01/08/18   Davonna Belling, MD  Melatonin 3 MG CAPS Take 9 mg by mouth at bedtime.    [provider]  metFORMIN (GLUCOPHAGE) 500 MG tablet Take 1 tablet (500 mg total) by mouth 2 (two) times daily with a meal. For diabetes management 11/21/17   Lindell Spar I, NP  mirtazapine  (REMERON) 45 MG tablet Take 45 mg by mouth at bedtime.    [provider]  mupirocin cream (BACTROBAN) 2 % Apply topically 2 (two) times daily. Patient not taking: Reported on 01/04/2018 11/21/17   Lindell Spar I, NP  nicotine (NICODERM CQ - DOSED IN MG/24 HOURS) 21 mg/24hr patch Place 1 patch (21 mg total) onto the skin daily. (May buy from over the counter): For smoking cessation Patient not taking: Reported on 01/04/2018 11/22/17   Lindell Spar I, NP  nitroGLYCERIN (NITROSTAT) 0.4 MG SL tablet Place 1 tablet (0.4 mg total) under the tongue every 5 (five) minutes x 3 doses as needed. For chest pain Patient not taking: Reported on 01/04/2018 11/21/17 11/21/18  Lindell Spar I, NP  OLANZapine (ZYPREXA) 20 MG tablet Take 20 mg by mouth at bedtime.    [provider]  Oxcarbazepine (TRILEPTAL) 300 MG tablet Take 1 tablet (300 mg total) by mouth at bedtime. For mood stabilization Patient not taking: Reported on 03/07/2018 01/08/18   Davonna Belling, MD  QUEtiapine (SEROQUEL) 200 MG tablet Take 400 mg by mouth 2 (two) times daily.    [provider]  QUEtiapine (SEROQUEL) 300 MG tablet Take 600 mg by mouth at bedtime.  11/30/17   [provider]  sertraline (ZOLOFT) 100 MG tablet Take 100 mg by mouth at bedtime.    [provider]  traZODone (DESYREL) 100 MG tablet Take 1 tablet (100 mg total) by mouth at bedtime. Patient not taking: Reported on 03/07/2018 01/08/18   Davonna Belling, MD  TRINTELLIX 10 MG TABS tablet Take 1 tablet (10 mg total) by mouth daily. For depression Patient not taking: Reported on 01/04/2018 11/21/17   Lindell Spar I, NP     Allergies Prednisone; Hydrocodone; Trazodone and nefazodone; and Varenicline  Family History  Problem Relation Age of Onset  . Prostate cancer Father   . Hypertension Mother   . Kidney Stones Mother   . Anxiety disorder Mother   . Depression Mother   . COPD Sister   . Hypertension Sister   . Diabetes  Sister   . Depression Sister   . Anxiety disorder Sister   . Seizures Sister   . ADD / ADHD Son   . ADD / ADHD Daughter     Social History Social History   Tobacco Use  . Smoking status: Current Every Day Smoker    Packs/day: 2.00    Years: 17.00    Pack years: 34.00    Types: Cigarettes, Cigars  . Smokeless tobacco: Former Systems developer  . Tobacco comment: occassional snuff   Substance  Use Topics  . Alcohol use: No    Alcohol/week: 0.0 standard drinks    Comment: last drinked in 3 months.   . Drug use: No    Review of Systems  Constitutional: No fever/chills Eyes: No visual changes.  ENT: No sore throat. Cardiovascular: Denies chest pain. Respiratory: Denies shortness of breath. Gastrointestinal: No abdominal pain.    Genitourinary: Negative for dysuria. Musculoskeletal: Negative for back pain. Skin: Negative for rash. Neurological: Negative for headaches or weakness   ____________________________________________   PHYSICAL EXAM:  VITAL SIGNS: ED Triage Vitals  Enc Vitals Group     BP 03/09/18 1519 (!) 139/96     Pulse Rate 03/09/18 1519 (!) 110     Resp 03/09/18 1519 20     Temp 03/09/18 1519 99 F (37.2 C)     Temp Source 03/09/18 1519 Oral     SpO2 03/09/18 1519 96 %     Weight 03/09/18 1516 92.1 kg (203 lb)     Height 03/09/18 1516 1.88 m (6\' 2" )     Head Circumference --      Peak Flow --      Pain Score 03/09/18 1516 7     Pain Loc --      Pain Edu? --      Excl. in [place]? --     Constitutional: Alert and oriented. No acute distress.  Anxious  Head: Ecchymosis left orbit, old Nose: No swelling or epistaxis Mouth/Throat: Mucous membranes are moist.   Neck:  Painless ROM, no vertebral tenderness to palpation Cardiovascular: Mild tachycardia, regular rhythm. Grossly normal heart sounds.  Good peripheral circulation. Respiratory: Normal respiratory effort.  No retractions. Lungs CTAB. Gastrointestinal: Soft and nontender. No distention.     Musculoskeletal: No lower extremity tenderness, normal strength in the lower extremities.  Warm and well perfused Neurologic:  Normal speech and language. No gross focal neurologic deficits are appreciated.  Cranial nerves II to XII are normal Skin:  Skin is warm, dry and intact Psychiatric: Anxious.  Speech and behavior are normal.  ____________________________________________   LABS (all labs ordered are listed, but only abnormal results are displayed)  Labs Reviewed  BASIC METABOLIC PANEL - Abnormal; Notable for the following components:      Result Value   CO2 21 (*)    Glucose, Bld 223 (*)    BUN <5 (*)    Calcium 8.8 (*)    All other components within normal limits  URINALYSIS, COMPLETE (UACMP) WITH MICROSCOPIC - Abnormal; Notable for the following components:   Color, Urine STRAW (*)    APPearance CLEAR (*)    Specific Gravity, Urine 1.003 (*)    Glucose, UA >=500 (*)    Leukocytes, UA MODERATE (*)    All other components within normal limits  CBC  TROPONIN I   ____________________________________________  EKG  ED ECG REPORT I, Lavonia Drafts, the attending physician, personally viewed and interpreted this ECG.  Date: 03/09/2018  Rate: 108 Rhythm: Sinus tachycardia QRS Axis: normal Intervals: normal ST/T Wave abnormalities: normal Narrative Interpretation: no evidence of acute ischemia  ____________________________________________  RADIOLOGY  Chest x-ray unremarkable ____________________________________________   PROCEDURES  Procedure(s) performed: No  Procedures   Critical Care performed: No ____________________________________________   INITIAL IMPRESSION / ASSESSMENT AND PLAN / ED COURSE  Pertinent labs & imaging results that were available during my care of the patient were reviewed by me and considered in my medical decision making (see chart for details).  Patient presents with weakness  as described above.  Second visit in the week  for this.  Symptoms do not appear cardiac as no history of palpitations or heart racing or chest pain.  He describes that his legs feel weak.  He denies substance abuse at this time.   Patient's work-up is overall unremarkable, still pending urinalysis.  Patient states that he is feeling better after IV fluids, he would like to leave urinalysis not yet resulted  Possible UTI on urinalysis over the patient has left, we will attempt to call in antibiotics for him.    ____________________________________________   FINAL CLINICAL IMPRESSION(S) / ED DIAGNOSES  Final diagnoses:  Near syncope        Note:  This document was prepared using Dragon voice recognition software and may include unintentional dictation errors.    Lavonia Drafts, MD 03/09/18 Tresa Moore

## 2018-03-12 DIAGNOSIS — I2510 Atherosclerotic heart disease of native coronary artery without angina pectoris: Secondary | ICD-10-CM | POA: Diagnosis not present

## 2018-03-12 DIAGNOSIS — E1165 Type 2 diabetes mellitus with hyperglycemia: Secondary | ICD-10-CM | POA: Diagnosis not present

## 2018-03-12 DIAGNOSIS — E782 Mixed hyperlipidemia: Secondary | ICD-10-CM | POA: Diagnosis not present

## 2018-03-12 DIAGNOSIS — R030 Elevated blood-pressure reading, without diagnosis of hypertension: Secondary | ICD-10-CM | POA: Diagnosis not present

## 2018-03-30 ENCOUNTER — Encounter: Payer: Self-pay | Admitting: Emergency Medicine

## 2018-03-30 ENCOUNTER — Inpatient Hospital Stay
Admission: AD | Admit: 2018-03-30 | Discharge: 2018-04-01 | DRG: 885 | Disposition: A | Discharge status: Home / Self Care | Payer: Medicare Other | Source: Intra-hospital | Attending: Psychiatry | Admitting: Psychiatry

## 2018-03-30 ENCOUNTER — Emergency Department
Admission: EM | Admit: 2018-03-30 | Discharge: 2018-03-30 | Disposition: A | Discharge status: Other Acute Inpatient Hospital | Payer: Medicare Other | Source: Home / Self Care | Attending: Emergency Medicine | Admitting: Emergency Medicine

## 2018-03-30 DIAGNOSIS — F1010 Alcohol abuse, uncomplicated: Secondary | ICD-10-CM | POA: Diagnosis present

## 2018-03-30 DIAGNOSIS — E119 Type 2 diabetes mellitus without complications: Secondary | ICD-10-CM | POA: Diagnosis present

## 2018-03-30 DIAGNOSIS — Z79890 Hormone replacement therapy (postmenopausal): Secondary | ICD-10-CM

## 2018-03-30 DIAGNOSIS — Z915 Personal history of self-harm: Secondary | ICD-10-CM

## 2018-03-30 DIAGNOSIS — F609 Personality disorder, unspecified: Secondary | ICD-10-CM | POA: Diagnosis present

## 2018-03-30 DIAGNOSIS — Z87442 Personal history of urinary calculi: Secondary | ICD-10-CM

## 2018-03-30 DIAGNOSIS — I10 Essential (primary) hypertension: Secondary | ICD-10-CM | POA: Diagnosis present

## 2018-03-30 DIAGNOSIS — F17290 Nicotine dependence, other tobacco product, uncomplicated: Secondary | ICD-10-CM | POA: Diagnosis present

## 2018-03-30 DIAGNOSIS — G2581 Restless legs syndrome: Secondary | ICD-10-CM | POA: Diagnosis present

## 2018-03-30 DIAGNOSIS — R45851 Suicidal ideations: Secondary | ICD-10-CM | POA: Diagnosis present

## 2018-03-30 DIAGNOSIS — Z8249 Family history of ischemic heart disease and other diseases of the circulatory system: Secondary | ICD-10-CM

## 2018-03-30 DIAGNOSIS — N189 Chronic kidney disease, unspecified: Secondary | ICD-10-CM | POA: Insufficient documentation

## 2018-03-30 DIAGNOSIS — G3184 Mild cognitive impairment, so stated: Secondary | ICD-10-CM | POA: Diagnosis present

## 2018-03-30 DIAGNOSIS — Z7902 Long term (current) use of antithrombotics/antiplatelets: Secondary | ICD-10-CM

## 2018-03-30 DIAGNOSIS — F333 Major depressive disorder, recurrent, severe with psychotic symptoms: Secondary | ICD-10-CM

## 2018-03-30 DIAGNOSIS — M542 Cervicalgia: Secondary | ICD-10-CM | POA: Diagnosis present

## 2018-03-30 DIAGNOSIS — F323 Major depressive disorder, single episode, severe with psychotic features: Secondary | ICD-10-CM | POA: Diagnosis present

## 2018-03-30 DIAGNOSIS — I252 Old myocardial infarction: Secondary | ICD-10-CM | POA: Diagnosis not present

## 2018-03-30 DIAGNOSIS — E1165 Type 2 diabetes mellitus with hyperglycemia: Secondary | ICD-10-CM | POA: Diagnosis present

## 2018-03-30 DIAGNOSIS — Z79899 Other long term (current) drug therapy: Secondary | ICD-10-CM

## 2018-03-30 DIAGNOSIS — I129 Hypertensive chronic kidney disease with stage 1 through stage 4 chronic kidney disease, or unspecified chronic kidney disease: Secondary | ICD-10-CM | POA: Insufficient documentation

## 2018-03-30 DIAGNOSIS — Z818 Family history of other mental and behavioral disorders: Secondary | ICD-10-CM

## 2018-03-30 DIAGNOSIS — D759 Disease of blood and blood-forming organs, unspecified: Secondary | ICD-10-CM | POA: Diagnosis present

## 2018-03-30 DIAGNOSIS — Z981 Arthrodesis status: Secondary | ICD-10-CM

## 2018-03-30 DIAGNOSIS — Z765 Malingerer [conscious simulation]: Secondary | ICD-10-CM | POA: Diagnosis not present

## 2018-03-30 DIAGNOSIS — G894 Chronic pain syndrome: Secondary | ICD-10-CM | POA: Diagnosis present

## 2018-03-30 DIAGNOSIS — Z955 Presence of coronary angioplasty implant and graft: Secondary | ICD-10-CM

## 2018-03-30 DIAGNOSIS — E1122 Type 2 diabetes mellitus with diabetic chronic kidney disease: Secondary | ICD-10-CM | POA: Insufficient documentation

## 2018-03-30 DIAGNOSIS — F411 Generalized anxiety disorder: Secondary | ICD-10-CM | POA: Diagnosis present

## 2018-03-30 DIAGNOSIS — R45850 Homicidal ideations: Secondary | ICD-10-CM | POA: Diagnosis present

## 2018-03-30 DIAGNOSIS — K760 Fatty (change of) liver, not elsewhere classified: Secondary | ICD-10-CM | POA: Diagnosis present

## 2018-03-30 DIAGNOSIS — Z7982 Long term (current) use of aspirin: Secondary | ICD-10-CM | POA: Insufficient documentation

## 2018-03-30 DIAGNOSIS — I2510 Atherosclerotic heart disease of native coronary artery without angina pectoris: Secondary | ICD-10-CM

## 2018-03-30 DIAGNOSIS — F329 Major depressive disorder, single episode, unspecified: Secondary | ICD-10-CM

## 2018-03-30 DIAGNOSIS — F17210 Nicotine dependence, cigarettes, uncomplicated: Secondary | ICD-10-CM | POA: Diagnosis present

## 2018-03-30 DIAGNOSIS — Z825 Family history of asthma and other chronic lower respiratory diseases: Secondary | ICD-10-CM

## 2018-03-30 DIAGNOSIS — Z885 Allergy status to narcotic agent status: Secondary | ICD-10-CM

## 2018-03-30 DIAGNOSIS — K219 Gastro-esophageal reflux disease without esophagitis: Secondary | ICD-10-CM | POA: Diagnosis present

## 2018-03-30 DIAGNOSIS — E785 Hyperlipidemia, unspecified: Secondary | ICD-10-CM | POA: Diagnosis present

## 2018-03-30 DIAGNOSIS — Z833 Family history of diabetes mellitus: Secondary | ICD-10-CM

## 2018-03-30 DIAGNOSIS — Z888 Allergy status to other drugs, medicaments and biological substances status: Secondary | ICD-10-CM

## 2018-03-30 DIAGNOSIS — Z8042 Family history of malignant neoplasm of prostate: Secondary | ICD-10-CM

## 2018-03-30 DIAGNOSIS — J45909 Unspecified asthma, uncomplicated: Secondary | ICD-10-CM

## 2018-03-30 DIAGNOSIS — G8929 Other chronic pain: Secondary | ICD-10-CM | POA: Diagnosis present

## 2018-03-30 DIAGNOSIS — Z7984 Long term (current) use of oral hypoglycemic drugs: Secondary | ICD-10-CM

## 2018-03-30 DIAGNOSIS — I152 Hypertension secondary to endocrine disorders: Secondary | ICD-10-CM | POA: Diagnosis present

## 2018-03-30 LAB — CBC WITH DIFFERENTIAL/PLATELET
Abs Immature Granulocytes: 0.03 10*3/uL (ref 0.00–0.07)
BASOS PCT: 1 %
Basophils Absolute: 0.1 10*3/uL (ref 0.0–0.1)
Eosinophils Absolute: 0.2 10*3/uL (ref 0.0–0.5)
Eosinophils Relative: 3 %
HCT: 42.6 % (ref 39.0–52.0)
HEMOGLOBIN: 14 g/dL (ref 13.0–17.0)
Immature Granulocytes: 0 %
Lymphocytes Relative: 20 %
Lymphs Abs: 1.4 10*3/uL (ref 0.7–4.0)
MCH: 30.4 pg (ref 26.0–34.0)
MCHC: 32.9 g/dL (ref 30.0–36.0)
MCV: 92.4 fL (ref 80.0–100.0)
MONO ABS: 0.5 10*3/uL (ref 0.1–1.0)
Monocytes Relative: 7 %
Neutro Abs: 4.7 10*3/uL (ref 1.7–7.7)
Neutrophils Relative %: 69 %
Platelets: 231 10*3/uL (ref 150–400)
RBC: 4.61 MIL/uL (ref 4.22–5.81)
RDW: 15.9 % — ABNORMAL HIGH (ref 11.5–15.5)
WBC: 6.8 10*3/uL (ref 4.0–10.5)
nRBC: 0 % (ref 0.0–0.2)

## 2018-03-30 LAB — SALICYLATE LEVEL: Salicylate Lvl: <7 mg/dL (ref 2.8–30.0)

## 2018-03-30 LAB — COMPREHENSIVE METABOLIC PANEL
ALBUMIN: 3.7 g/dL (ref 3.5–5.0)
ALK PHOS: 132 U/L — AB (ref 38–126)
ALT: 28 U/L (ref 0–44)
AST: 21 U/L (ref 15–41)
Anion gap: 9 (ref 5–15)
BUN: 9 mg/dL (ref 6–20)
CO2: 20 mmol/L — ABNORMAL LOW (ref 22–32)
Calcium: 8.5 mg/dL — ABNORMAL LOW (ref 8.9–10.3)
Chloride: 107 mmol/L (ref 98–111)
Creatinine, Ser: 0.79 mg/dL (ref 0.61–1.24)
GFR calc Af Amer: >60 mL/min (ref >60)
GFR calc non Af Amer: >60 mL/min (ref >60)
Glucose, Bld: 244 mg/dL — ABNORMAL HIGH (ref 70–99)
Potassium: 3.6 mmol/L (ref 3.5–5.1)
Sodium: 136 mmol/L (ref 135–145)
TOTAL PROTEIN: 7.1 g/dL (ref 6.5–8.1)
Total Bilirubin: 0.6 mg/dL (ref 0.3–1.2)

## 2018-03-30 LAB — URINE DRUG SCREEN, QUALITATIVE (ARMC ONLY)
Amphetamines, Ur Screen: NOT DETECTED
Barbiturates, Ur Screen: NOT DETECTED
Benzodiazepine, Ur Scrn: NOT DETECTED
CANNABINOID 50 NG, UR ~~LOC~~: NOT DETECTED
Cocaine Metabolite,Ur ~~LOC~~: NOT DETECTED
MDMA (Ecstasy)Ur Screen: NOT DETECTED
Methadone Scn, Ur: NOT DETECTED
Opiate, Ur Screen: NOT DETECTED
Phencyclidine (PCP) Ur S: NOT DETECTED
Tricyclic, Ur Screen: POSITIVE — AB

## 2018-03-30 LAB — ETHANOL: Alcohol, Ethyl (B): <10 mg/dL (ref <10)

## 2018-03-30 LAB — GLUCOSE, CAPILLARY: Glucose-Capillary: 123 mg/dL — ABNORMAL HIGH (ref 70–99)

## 2018-03-30 LAB — ACETAMINOPHEN LEVEL: Acetaminophen (Tylenol), Serum: <10 ug/mL — ABNORMAL LOW (ref 10–30)

## 2018-03-30 LAB — VALPROIC ACID LEVEL: Valproic Acid Lvl: <10 ug/mL — ABNORMAL LOW (ref 50.0–100.0)

## 2018-03-30 MED ORDER — NICOTINE 21 MG/24HR TD PT24
21.0000 mg | MEDICATED_PATCH | Freq: Every day | TRANSDERMAL | Status: DC
  Administered 2018-03-31 – 2018-04-01 (×2): 21 mg via TRANSDERMAL
  Filled 2018-03-30 (×2): qty 1

## 2018-03-30 MED ORDER — QUETIAPINE FUMARATE 200 MG PO TABS
200.0000 mg | ORAL_TABLET | Freq: Two times a day (BID) | ORAL | Status: DC
  Administered 2018-03-31 (×2): 200 mg via ORAL
  Filled 2018-03-30 (×2): qty 1

## 2018-03-30 MED ORDER — ALUM & MAG HYDROXIDE-SIMETH 200-200-20 MG/5ML PO SUSP: 30.0000 mL | ORAL | Status: DC | PRN

## 2018-03-30 MED ORDER — SERTRALINE HCL 20 MG/ML PO CONC
50.0000 mg | Freq: Every day | ORAL | Status: DC
  Filled 2018-03-30 (×2): qty 2.5

## 2018-03-30 MED ORDER — GABAPENTIN 300 MG PO CAPS
900.0000 mg | ORAL_CAPSULE | Freq: Three times a day (TID) | ORAL | Status: DC
  Administered 2018-03-31 – 2018-04-01 (×5): 900 mg via ORAL
  Filled 2018-03-30 (×5): qty 3

## 2018-03-30 MED ORDER — ACETAMINOPHEN 325 MG PO TABS: 650.0000 mg | ORAL_TABLET | Freq: Four times a day (QID) | ORAL | Status: DC | PRN

## 2018-03-30 MED ORDER — HYDROXYZINE HCL 50 MG PO TABS: 50.0000 mg | ORAL_TABLET | ORAL | Status: DC | PRN

## 2018-03-30 MED ORDER — CARVEDILOL 6.25 MG PO TABS
6.2500 mg | ORAL_TABLET | Freq: Two times a day (BID) | ORAL | Status: DC
  Administered 2018-03-31 – 2018-04-01 (×3): 6.25 mg via ORAL
  Filled 2018-03-30 (×3): qty 1

## 2018-03-30 MED ORDER — LORAZEPAM 2 MG PO TABS
2.0000 mg | ORAL_TABLET | Freq: Once | ORAL | Status: AC
  Administered 2018-03-30: 2 mg via ORAL
  Filled 2018-03-30: qty 1

## 2018-03-30 MED ORDER — METFORMIN HCL 500 MG PO TABS: 500.0000 mg | ORAL_TABLET | Freq: Two times a day (BID) | ORAL | Status: DC

## 2018-03-30 MED ORDER — QUETIAPINE FUMARATE 200 MG PO TABS
600.0000 mg | ORAL_TABLET | Freq: Once | ORAL | Status: AC
  Administered 2018-03-31: 600 mg via ORAL
  Filled 2018-03-30: qty 6

## 2018-03-30 MED ORDER — LAMOTRIGINE 100 MG PO TABS
100.0000 mg | ORAL_TABLET | Freq: Every day | ORAL | Status: DC
  Administered 2018-03-31 – 2018-04-01 (×2): 100 mg via ORAL
  Filled 2018-03-30 (×2): qty 1

## 2018-03-30 MED ORDER — LORAZEPAM 2 MG PO TABS
2.0000 mg | ORAL_TABLET | Freq: Four times a day (QID) | ORAL | Status: DC | PRN
  Administered 2018-03-30 (×2): 2 mg via ORAL
  Filled 2018-03-30 (×2): qty 1

## 2018-03-30 MED ORDER — MAGNESIUM HYDROXIDE 400 MG/5ML PO SUSP: 30.0000 mL | Freq: Every day | ORAL | Status: DC | PRN

## 2018-03-30 MED ORDER — METFORMIN HCL 500 MG PO TABS
500.0000 mg | ORAL_TABLET | Freq: Two times a day (BID) | ORAL | Status: DC
  Administered 2018-03-31 – 2018-04-01 (×3): 500 mg via ORAL
  Filled 2018-03-30 (×3): qty 1

## 2018-03-30 NOTE — ED Notes (Signed)
Report given to [facility] machine placed in patients room, pt. Ready.

## 2018-03-30 NOTE — ED Notes (Signed)
Patient observed lying in bed with eyes closed  Even, unlabored respirations observed   NAD pt appears to be sleeping  I will continue to monitor along with every 15 minute visual observations and ongoing security monitoring    

## 2018-03-30 NOTE — ED Triage Notes (Signed)
Pt. Brought in via [employer].  Pt. Making loud outbursts in triage, pt. Brought back to quad room #22.  Pt. States he is depressed with SI idealization.

## 2018-03-30 NOTE — ED Notes (Signed)
Pt had continued yelling intermittently and began slapping himself and then hitting himself with his fist occasionally. Pt would occasionally scream, "Help me." Spoke with EDP, who ordered Ativan 2 mg PRN. Spoke with pt, who said he had insomnia and wanted to go to sleep. Told pt that we don't give sleeping pills at this time of day and he said, "Why aren't y'all going to help me?" Talked with him about what help he wanted and asked him about coping skills, of which he said he had none. Asked him what he had learned during previous inpatient admissions. "Nothing." He said he had been compliant with medications; when it was pointed out that his valproic acid level was low, he said, "I ran out." (Med was last filled 12/7.) Pt was aware that he has been deemed appropriate for inpatient placement, and he asked when that would be. He was encouraged to exhibit appropriate behavior to expedite the process, and to lie down/rest.   Minutes later, pt continued to yell and slap/hit himself. He was not redirectable. PO Ativan was given PRN. Pt is now resting with eyes closed.

## 2018-03-30 NOTE — ED Notes (Addendum)
Pt transferred into ED BHU after plan of care discussed with him and report given to RN in that area  Pt escorted by Edison Nasuti EDT  Pt ambulated with a steady gait during transfer       [facility] referral for inpatient admission   Pt is here voluntarily

## 2018-03-30 NOTE — ED Notes (Signed)
Pt was reminded of need to provide urine specimen.

## 2018-03-30 NOTE — ED Notes (Signed)
Pt. Laying in bed waiting to be admitted downstairs.  Pt. Has no questions or concerns at this time.

## 2018-03-30 NOTE — BH Assessment (Signed)
Patient is to be admitted to [facility] by Dr. Minette Brine.  Attending Physician will be [name].   Patient has been assigned to room 322, by [facility] Charge Nurse Jake Church.   Intake Paper Work has been signed and placed on patient chart.  ER staff is aware of the admission:  Olivia Mackie, ER Secretary    Dr. Archie Balboa, ER MD   Santiago Glad, Patient's Nurse   Deatra Canter, Patient Access.

## 2018-03-30 NOTE — ED Provider Notes (Signed)
[facility] Emergency Department Provider Note  ____________________________________________   First MD Initiated Contact with Patient 03/30/18 0502     (approximate)  I have reviewed the triage vital signs and the nursing notes.   HISTORY  Chief Complaint Behavior Problem   HPI Michael Schmitt is a 48 y.o. male who comes to the emergency department voluntarily with Michael Schmitt police requesting inpatient stay for "suicidal thoughts".  The patient has a longstanding history of psychiatric illness including anxiety and depression.  He says that he came to the emergency department tonight because it is the weekend and he knows that [name] is not working because "[name] will send me home".  He says that earlier today he took a knife and held it to his wrist but he did not cut it.  He says he is never actually tried to hurt himself.  He reports intermittent compliance with his psychiatric medications and says he has not taken his Depakote in quite some time.  His symptoms have been gradual onset slowly progressive are now constant and seem to be worsened by interpersonal conflict and nothing seems to make them better.  He is requesting Ativan by name along with a specific inpatient psychiatric facility that he would like to go to.    Past Medical History:  Diagnosis Date  . Anxiety   . Anxiety   . Arthritis    cerv. stenosis, spondylosis, HNP- lower back , has been followed in pain clinic, has  had injection s in cerv. area  . Blood dyscrasia    told that when he was young he was a" free bleeder"  . CAD (coronary artery disease)   . Cervical spondylosis without myelopathy 07/24/2014  . Cervicogenic headache 07/24/2014  . Chronic kidney disease    renal calculi- passed spontaneously  . Depression   . Diabetes mellitus without complication ([place])   . Fatty liver   . GERD (gastroesophageal reflux disease)   . Headache(784.0)   . Hyperlipidemia   .  Hypertension   . Mental disorder   . MI, old   . RLS (restless legs syndrome)    detected on sleep study  . Shortness of breath   . Ventricular fibrillation ([place]) 04/05/2016    Patient Active Problem List   Diagnosis Date Noted  . Personality disorder ([place]) 12/31/2017  . MDD (major depressive disorder), recurrent episode, severe ([place]) 11/20/2017  . Dysthymia 10/26/2017  . Severe recurrent major depression without psychotic features ([place]) 03/12/2017  . Generalized anxiety disorder 02/05/2017  . Chronic neck pain (Primary Area of Pain) (Left) 11/22/2016  . Cervical facet syndrome (Left) 11/22/2016  . Cervical radiculitis (Left) 11/22/2016  . History of cervical spinal surgery 11/22/2016  . Cervical spondylosis 11/22/2016  . Chronic shoulder pain (Secondary Area of Pain) (Left) 11/22/2016  . Arthropathy of shoulder (Left) 11/22/2016  . Chronic low back pain [facility] Area of Pain) (Bilateral) (L>R) 11/22/2016  . Lumbar facet syndrome (Bilateral) (L>R) 11/22/2016  . Chronic pain syndrome 11/21/2016  . Long term prescription benzodiazepine use 11/21/2016  . Opiate use 11/21/2016  . Uncontrolled type 2 diabetes mellitus without complication, without long-term current use of insulin ([place]) 10/19/2016  . Chronic Suicidal ideation 09/15/2016  . Coronary artery disease 09/12/2016  . HTN (hypertension) 06/26/2016  . GERD (gastroesophageal reflux disease) 06/26/2016  . Alcohol use disorder, severe, 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[place]) 06/26/2016  . Benzodiazepine abuse ([place]) 06/11/2016  . Sedative, hypnotic or anxiolytic use disorder, severe, dependence ([place])  05/12/2016  . Ventricular fibrillation ([place]) 04/05/2016  . Combined hyperlipidemia 01/05/2016  . Cervical spondylosis without myelopathy 07/24/2014  . Severe episode of recurrent major depressive disorder, without psychotic features ([place]) 04/15/2013  . Tobacco use disorder 10/20/2010  . Asthma 10/20/2010    Past Surgical History:  Procedure  Laterality Date  . ANTERIOR CERVICAL DECOMP/DISCECTOMY FUSION  11/13/2011   Procedure: ANTERIOR CERVICAL DECOMPRESSION/DISCECTOMY FUSION 1 LEVEL;  Surgeon: [name], MD;  Location: [place] NEURO ORS;  Service: Neurosurgery;  Laterality: N/A;  Anterior Cervical Decompression/discectomy Fusion. Cervical three-four.  Marland Kitchen CARDIAC CATHETERIZATION N/A 01/01/2015   Procedure: Left Heart Cath and Coronary Angiography;  Surgeon: [name], MD;  Location: [place] CV LAB;  Service: Cardiovascular;  Laterality: N/A;  . CARDIAC CATHETERIZATION N/A 04/05/2016   Procedure: Left Heart Cath and Coronary Angiography;  Surgeon: Belva Crome, MD;  Location: [place] CV LAB;  Service: Cardiovascular;  Laterality: N/A;  . CARDIAC CATHETERIZATION N/A 04/05/2016   Procedure: Coronary Stent Intervention;  Surgeon: Belva Crome, MD;  Location: [place] CV LAB;  Service: Cardiovascular;  Laterality: N/A;  . CARDIAC CATHETERIZATION N/A 04/05/2016   Procedure: Intravascular Ultrasound/IVUS;  Surgeon: Belva Crome, MD;  Location: [place] CV LAB;  Service: Cardiovascular;  Laterality: N/A;  . NASAL SINUS SURGERY     2005    Prior to Admission medications   Medication Sig Start Date End Date Taking? Authorizing Provider  ARIPiprazole (ABILIFY) 30 MG tablet Take 30 mg by mouth at bedtime.    [provider]  aspirin 81 MG EC tablet Take 1 tablet (81 mg total) by mouth daily. For heart health 11/21/17   Lindell Spar I, NP  atorvastatin (LIPITOR) 80 MG tablet Take 1 tablet (80 mg total) by mouth daily at 6 PM. For high cholesterol Patient taking differently: Take 80 mg by mouth daily. For high cholesterol 11/21/17   Nwoko, Herbert Pun I, NP  busPIRone (BUSPAR) 15 MG tablet Take 15 mg by mouth 3 (three) times daily.    [provider]  carbamazepine (TEGRETOL) 200 MG tablet Take 1 tablet (200 mg total) by mouth daily. Patient taking differently: Take 200 mg by mouth 2 (two) times daily.  01/08/18    Davonna Belling, MD  carvedilol (COREG) 6.25 MG tablet Take 1 tablet (6.25 mg total) by mouth 2 (two) times daily with a meal. For high blood pressure 11/21/17   Nwoko, Herbert Pun I, NP  chlorproMAZINE (THORAZINE) 200 MG tablet Take 400 mg by mouth at bedtime.  12/13/17   [provider]  clonazePAM (KLONOPIN) 0.5 MG tablet Take 0.5 mg by mouth 3 (three) times daily as needed for anxiety.    [provider]  clonazePAM (KLONOPIN) 1 MG tablet Take 1 mg by mouth 3 (three) times daily.  12/28/17   [provider]  clopidogrel (PLAVIX) 75 MG tablet Take 1 tablet (75 mg total) by mouth daily. For blood clot prevention 11/22/17   Lindell Spar I, NP  divalproex (DEPAKOTE) 500 MG DR tablet Take 1 tablet (500 mg total) by mouth at bedtime. Patient taking differently: Take 500 mg by mouth 2 (two) times daily.  01/08/18   Davonna Belling, MD  doxepin (SINEQUAN) 50 MG capsule Take 1 capsule (50 mg total) by mouth at bedtime. Patient taking differently: Take 100 mg by mouth at bedtime.  01/08/18   Davonna Belling, MD  escitalopram (LEXAPRO) 10 MG tablet Take 10 mg by mouth daily.    [provider]  gabapentin (NEURONTIN) 300 MG capsule Take 1 capsule (300 mg total) by mouth 3 (three) times daily. For agitation/neuropathic pain Patient taking differently: Take 900 mg by mouth 3 (three) times daily. For agitation/neuropathic pain 11/21/17   Lindell Spar I, NP  gabapentin (NEURONTIN) 600 MG tablet Take 600 mg by mouth 3 (three) times daily.  12/28/17   [provider]  ibuprofen (ADVIL,MOTRIN) 800 MG tablet Take 2,400 mg by mouth 3 (three) times daily as needed.    [provider]  lamoTRIgine (LAMICTAL) 25 MG tablet Take 50 mg by mouth at bedtime.    [provider]  lithium carbonate (ESKALITH) 450 MG CR tablet Take 900 mg by mouth 2 (two) times daily.    [provider]  lurasidone (LATUDA) 80 MG TABS tablet Take 1 tablet (80 mg total) by mouth  at bedtime. Patient not taking: Reported on 03/07/2018 01/08/18   Davonna Belling, MD  Melatonin 3 MG CAPS Take 9 mg by mouth at bedtime.    [provider]  metFORMIN (GLUCOPHAGE) 500 MG tablet Take 1 tablet (500 mg total) by mouth 2 (two) times daily with a meal. For diabetes management 11/21/17   Lindell Spar I, NP  mirtazapine (REMERON) 45 MG tablet Take 45 mg by mouth at bedtime.    [provider]  mupirocin cream (BACTROBAN) 2 % Apply topically 2 (two) times daily. Patient not taking: Reported on 01/04/2018 11/21/17   Lindell Spar I, NP  nicotine (NICODERM CQ - DOSED IN MG/24 HOURS) 21 mg/24hr patch Place 1 patch (21 mg total) onto the skin daily. (May buy from over the counter): For smoking cessation Patient not taking: Reported on 01/04/2018 11/22/17   Lindell Spar I, NP  nitroGLYCERIN (NITROSTAT) 0.4 MG SL tablet Place 1 tablet (0.4 mg total) under the tongue every 5 (five) minutes x 3 doses as needed. For chest pain Patient not taking: Reported on 01/04/2018 11/21/17 11/21/18  Lindell Spar I, NP  OLANZapine (ZYPREXA) 20 MG tablet Take 20 mg by mouth at bedtime.    [provider]  Oxcarbazepine (TRILEPTAL) 300 MG tablet Take 1 tablet (300 mg total) by mouth at bedtime. For mood stabilization Patient not taking: Reported on 03/07/2018 01/08/18   Davonna Belling, MD  QUEtiapine (SEROQUEL) 200 MG tablet Take 400 mg by mouth 2 (two) times daily.    [provider]  QUEtiapine (SEROQUEL) 300 MG tablet Take 600 mg by mouth at bedtime.  11/30/17   [provider]  sertraline (ZOLOFT) 100 MG tablet Take 100 mg by mouth at bedtime.    [provider]  traZODone (DESYREL) 100 MG tablet Take 1 tablet (100 mg total) by mouth at bedtime. Patient not taking: Reported on 03/07/2018 01/08/18   Davonna Belling, MD  TRINTELLIX 10 MG TABS tablet Take 1 tablet (10 mg total) by mouth daily. For depression Patient not taking: Reported on 01/04/2018  11/21/17   Lindell Spar I, NP    Allergies Prednisone; Hydrocodone; Trazodone and nefazodone; and Varenicline  Family History  Problem Relation Age of Onset  . Prostate cancer Father   . Hypertension Mother   . Kidney Stones Mother   . Anxiety disorder Mother   . Depression Mother   . COPD Sister   . Hypertension Sister   . Diabetes Sister   . Depression Sister   . Anxiety disorder Sister   . Seizures Sister   . ADD / ADHD Son   . ADD / ADHD Daughter  Social History Social History   Tobacco Use  . Smoking status: Current Every Day Smoker    Packs/day: 2.00    Years: 17.00    Pack years: 34.00    Types: Cigarettes, Cigars  . Smokeless tobacco: Former Systems developer  . Tobacco comment: occassional snuff   Substance Use Topics  . Alcohol use: No    Alcohol/week: 0.0 standard drinks    Comment: last drinked in 3 months.   . Drug use: No    Review of Systems Constitutional: No fever/chills Eyes: No visual changes. ENT: No sore throat. Cardiovascular: Denies chest pain. Respiratory: Denies shortness of breath. Gastrointestinal: No abdominal pain.  No nausea, no vomiting.  No diarrhea.  No constipation. Genitourinary: Negative for dysuria. Musculoskeletal: Negative for back pain. Skin: Negative for rash. Neurological: Negative for headaches, focal weakness or numbness.   ____________________________________________   PHYSICAL EXAM:  VITAL SIGNS: ED Triage Vitals  Enc Vitals Group     BP      Pulse      Resp      Temp      Temp src      SpO2      Weight      Height      Head Circumference      Peak Flow      Pain Score      Pain Loc      Pain Edu?      Excl. in [place]?     Constitutional: Alert and oriented x4 appears frustrated and intermittently explosive yelling out loud Eyes: PERRL EOMI. midrange and brisk Head: Atraumatic. Nose: No congestion/rhinnorhea. Mouth/Throat: No trismus Neck: No stridor.   Cardiovascular: Normal rate, regular rhythm.  Grossly normal heart sounds.  Good peripheral circulation. Respiratory: Normal respiratory effort.  No retractions. Lungs CTAB and moving good air Gastrointestinal: Soft nontender Musculoskeletal: No lower extremity edema   Neurologic:  Normal speech and language. No gross focal neurologic deficits are appreciated. Skin:  Skin is warm, dry and intact. No rash noted. Psychiatric: Somewhat strange affect and appears frustrated and angry    ____________________________________________   DIFFERENTIAL includes but not limited to  Suicidal ideation, suicide attempt, alcohol intoxication, drug overdose, malingering ____________________________________________   LABS (all labs ordered are listed, but only abnormal results are displayed)  Labs Reviewed  ACETAMINOPHEN LEVEL - Abnormal; Notable for the following components:      Result Value   Acetaminophen (Tylenol), Serum <10 (*)    All other components within normal limits  COMPREHENSIVE METABOLIC PANEL - Abnormal; Notable for the following components:   CO2 20 (*)    Glucose, Bld 244 (*)    Calcium 8.5 (*)    Alkaline Phosphatase 132 (*)    All other components within normal limits  CBC WITH DIFFERENTIAL/PLATELET - Abnormal; Notable for the following components:   RDW 15.9 (*)    All other components within normal limits  VALPROIC ACID LEVEL - Abnormal; Notable for the following components:   Valproic Acid Lvl <10 (*)    All other components within normal limits  ETHANOL  SALICYLATE LEVEL  URINE DRUG SCREEN, QUALITATIVE ([facility] ONLY)    Lab work reviewed by me with no acute disease.  He has slightly increased glucose but no signs of DKA __________________________________________  EKG   ____________________________________________  RADIOLOGY   ____________________________________________   PROCEDURES  Procedure(s) performed: no  Procedures  Critical Care performed:  no  ____________________________________________   INITIAL IMPRESSION / ASSESSMENT AND PLAN /  ED COURSE  Pertinent labs & imaging results that were available during my care of the patient were reviewed by me and considered in my medical decision making (see chart for details).   As part of my medical decision making, I reviewed the following data within the [place] History obtained from family if available, nursing notes, old chart and ekg, as well as notes from prior ED visits.  The patient comes to the emergency department voluntarily requesting inpatient psychiatric admission.  He is saying that he put a knife to his wrist today but did not actually cut himself and I see no evidence of knife wound on his person.  His story is somewhat odd in that he specifically came to the emergency department on a day when [name] was not working.  He is requesting medications by name and psychiatric facilities by name.  He is intermittently screaming and making loud outbursts although overall is relatively well-appearing.  I have contacted the specialist on-call for recommendations but I actually feel the patient is malingering.  He clearly does not require involuntary commitment as his whole goal is to be in the hospital.  His lab work is unremarkable and he is medically stable for psychiatric evaluation.  I have given him 1 mg of oral lorazepam and Dr. Cherylann Banas will follow up on the results of the [facility]  Clinical Course as of Mar 30 732  Sat Mar 30, 2018  0704 Pending [facility] consult.     [SS]    Clinical Course User Index [SS] Arta Silence, MD     ____________________________________________   FINAL CLINICAL IMPRESSION(S) / ED DIAGNOSES  Final diagnoses:  Suicidal ideations  Malingering      NEW MEDICATIONS STARTED DURING THIS VISIT:  New Prescriptions   No medications on file     Note:  This document was prepared using Dragon voice recognition software  and may include unintentional dictation errors.    Darel Hong, MD 03/30/18 [phone]

## 2018-03-30 NOTE — ED Notes (Signed)
Upon transfer to [facility], pt was given water, blanket, and remote control. Pt endorsed SI without a plan, denied HI, and admitted to hearing his own voice telling him to kill himself. He was minimally participatory in conversation and asked that the light be turned off so he could rest. He denied any other needs, concerns, or questions. Will continue to monitor for needs and safety.

## 2018-03-30 NOTE — ED Notes (Signed)
Pt. Asked "Why do you shout out".  Pt. States, "because I am depressed".  Pt. Asked to keep voice down due to other patients sleeping, pt. States he would try.  Patient again asked if the psychiatrist on the tv screen would allow him to be admitted.  Pt. [facility] doctor and ER doctor would determine if you would benefit from inpatient treatment.

## 2018-03-30 NOTE — ED Notes (Signed)
Called pharmacy to due medication review of patient.  Pharmacy stated gap in coverage.  Pharmacy tech would be available in ED after 9 pm.

## 2018-03-30 NOTE — ED Notes (Signed)
Lunch tray handed to pt in room. After RN left room, pt minutes later began yelling out, wordlessly and intermittently. Pt then got up to restroom and returned to his room in no apparent acute distress. Will monitor for needs/safety.

## 2018-03-30 NOTE — ED Notes (Signed)
Pt. Asked "Is [name] working?"   Pt. States can I talk to the psychiatrist on the tv screen.  Pt. Asked "Why would you rather talk to psychiatrist on tv screen than [name]?.  Pt. States "because [name] will send me home".

## 2018-03-30 NOTE — BH Assessment (Signed)
Assessment Note  Michael Schmitt is an 48 y.o. male who presents to the ER due to having thoughts of ending his life by cutting his wrist. He further reports the thoughts of been ongoing and he came to the ER for help. Patient is well known to the ER due to similar presentation. Patient lives with his mother and sister. His two children also live in the home.   Patient denies HI and AV/H.  During the interview, the patient was calm cooperative and pleasant. He was able to provide appropriate answers to the questions. He denies the use of any mind-altering substances. He also denies having a history of violence and aggression towards others.  Diagnosis: Depression  Past Medical History:  Past Medical History:  Diagnosis Date  . Anxiety   . Anxiety   . Arthritis    cerv. stenosis, spondylosis, HNP- lower back , has been followed in pain clinic, has  had injection s in cerv. area  . Blood dyscrasia    told that when he was young he was a" free bleeder"  . CAD (coronary artery disease)   . Cervical spondylosis without myelopathy 07/24/2014  . Cervicogenic headache 07/24/2014  . Chronic kidney disease    renal calculi- passed spontaneously  . Depression   . Diabetes mellitus without complication ([place])   . Fatty liver   . GERD (gastroesophageal reflux disease)   . Headache(784.0)   . Hyperlipidemia   . Hypertension   . Mental disorder   . MI, old   . RLS (restless legs syndrome)    detected on sleep study  . Shortness of breath   . Ventricular fibrillation ([place]) 04/05/2016    Past Surgical History:  Procedure Laterality Date  . ANTERIOR CERVICAL DECOMP/DISCECTOMY FUSION  11/13/2011   Procedure: ANTERIOR CERVICAL DECOMPRESSION/DISCECTOMY FUSION 1 LEVEL;  Surgeon: [name], MD;  Location: [place] [facility];  Service: Neurosurgery;  Laterality: N/A;  Anterior Cervical Decompression/discectomy Fusion. Cervical three-four.  Marland Kitchen CARDIAC CATHETERIZATION N/A 01/01/2015   Procedure: Left  Heart Cath and Coronary Angiography;  Surgeon: [name], MD;  Location: [place] [facility];  Service: Cardiovascular;  Laterality: N/A;  . CARDIAC CATHETERIZATION N/A 04/05/2016   Procedure: Left Heart Cath and Coronary Angiography;  Surgeon: Belva Crome, MD;  Location: [place] [facility];  Service: Cardiovascular;  Laterality: N/A;  . CARDIAC CATHETERIZATION N/A 04/05/2016   Procedure: Coronary Stent Intervention;  Surgeon: Belva Crome, MD;  Location: [facility] [place] [facility];  Service: Cardiovascular;  Laterality: N/A;  . CARDIAC CATHETERIZATION N/A 04/05/2016   Procedure: Intravascular Ultrasound/IVUS;  Surgeon: Belva Crome, MD;  Location: [facility];  Service: Cardiovascular;  Laterality: N/A;  . NASAL SINUS SURGERY     2005    Family History:  Family History  Problem Relation Age of Onset  . Prostate cancer Father   . Hypertension Mother   . Kidney Stones Mother   . Anxiety disorder Mother   . Depression Mother   . COPD Sister   . Hypertension Sister   . Diabetes Sister   . Depression Sister   . Anxiety disorder Sister   . Seizures Sister   . ADD / ADHD Son   . ADD / ADHD Daughter     Social History:  reports that he has been smoking cigarettes and cigars. He has a 34.00 pack-year smoking history. He has quit using smokeless tobacco. He reports that he does not drink alcohol or use drugs.  Additional Social History:  Alcohol / Drug Use Pain Medications: see MAR Prescriptions: see MAR Over the Counter: see MAR History of alcohol / drug use?: No history of alcohol / drug abuse Longest period of sobriety (when/how long): N/A Negative Consequences of Use: Personal relationships, Scientist, research (physical sciences), Financial, Work / School  CIWA: CIWA-Ar BP: (!) 154/92 Pulse Rate: (!) 101 COWS:    Allergies:  Allergies  Allergen Reactions  . Prednisone Other (See Comments)    Hypertension and makes him feel "spacey"  . Hydrocodone Other (See Comments)    Causes headaches and makes the  patient irritable  . Trazodone And Nefazodone Other (See Comments)    Patient stated that this keeps him awake; "has an opposite effect on me"  . Varenicline Other (See Comments)    Suicidal thoughts    Home Medications: (Not in a hospital admission)   OB/GYN Status:  No LMP for male patient.  General Assessment Data Location of Assessment: [facility] ED TTS Assessment: In system Is this a Tele or Face-to-Face Assessment?: Face-to-Face Is this an Initial Assessment or a Re-assessment for this encounter?: Initial Assessment Patient Accompanied by:: N/A Language Other than English: No Living Arrangements: Other (Comment)(Private Home) What gender do you identify as?: Male Marital status: Single Maiden name: n/a Pregnancy Status: No Living Arrangements: Parent, Other relatives Can pt return to current living arrangement?: Yes Admission Status: Voluntary Is patient capable of signing voluntary admission?: Yes Referral Source: Self/Family/Friend Insurance type: [facility] MCR  Medical Screening Exam ([place]) Medical Exam completed: Yes  Crisis Care Plan Living Arrangements: Parent, Other relatives Name of Psychiatrist: Reports of none Name of Therapist: Reports of none  Education Status Is patient currently in school?: No Is the patient employed, unemployed or receiving disability?: Unemployed  Risk to self with the past 6 months Suicidal Ideation: Yes-Currently Present Has patient been a risk to self within the past 6 months prior to admission? : Yes Suicidal Intent: No Has patient had any suicidal intent within the past 6 months prior to admission? : No Is patient at risk for suicide?: Yes Suicidal Plan?: Yes-Currently Present Has patient had any suicidal plan within the past 6 months prior to admission? : Yes Specify Current Suicidal Plan: Cut wrist Access to Means: No What has been your use of drugs/alcohol within the last 12 months?: Reports of none Previous  Attempts/Gestures: No How many times?: 2 Other Self Harm Risks: Reports of none Triggers for Past Attempts: None known Intentional Self Injurious Behavior: None Family Suicide History: No Recent stressful life event(s): Other (Comment) Persecutory voices/beliefs?: No Depression: Yes Depression Symptoms: Feeling worthless/self pity, Guilt, Isolating Substance abuse history and/or treatment for substance abuse?: No Suicide prevention information given to non-admitted patients: Not applicable  Risk to Others within the past 6 months Homicidal Ideation: No Does patient have any lifetime risk of violence toward others beyond the six months prior to admission? : No Thoughts of Harm to Others: No Current Homicidal Intent: No Current Homicidal Plan: No Access to Homicidal Means: No Identified Victim: Reports of none History of harm to others?: No Assessment of Violence: None Noted Violent Behavior Description: Reports of none Does patient have access to weapons?: No Criminal Charges Pending?: No Does patient have a court date: No Is patient on probation?: No  Psychosis Hallucinations: None noted Delusions: None noted  Mental Status Report Appearance/Hygiene: Unremarkable, In scrubs Eye Contact: Fair Motor Activity: Freedom of movement, Unremarkable Speech: Logical/coherent, Unremarkable Level of Consciousness: Alert Mood: Sad, Pleasant,  Depressed Affect: Appropriate to circumstance, Depressed, Sad Anxiety Level: Minimal Thought Processes: Coherent, Relevant Judgement: Unimpaired Orientation: Person, Place, Time, Situation, Appropriate for developmental age Obsessive Compulsive Thoughts/Behaviors: Minimal  Cognitive Functioning Concentration: Normal Memory: Recent Intact, Remote Intact Is patient IDD: No Insight: Fair Impulse Control: Fair Appetite: Fair Have you had any weight changes? : No Change Sleep: No Change Total Hours of Sleep: 6 Vegetative Symptoms:  None  ADLScreening [facility] Assessment Services) Patient's cognitive ability adequate to safely complete daily activities?: Yes Patient able to express need for assistance with ADLs?: Yes Independently performs ADLs?: Yes (appropriate for developmental age)  Prior Inpatient Therapy Prior Inpatient Therapy: Yes Prior Therapy Dates: Multiple Hospitalizations Prior Therapy Facilty/Provider(s): [facility] Reason for Treatment: Depression  Prior Outpatient Therapy Prior Outpatient Therapy: No Does patient have an ACCT team?: No Does patient have Intensive In-House Services?  : No Does patient have [facility] services? : No Does patient have P4CC services?: No  ADL Screening (condition at time of admission) Patient's cognitive ability adequate to safely complete daily activities?: Yes Is the patient deaf or have difficulty hearing?: No Does the patient have difficulty seeing, even when wearing glasses/contacts?: No Does the patient have difficulty concentrating, remembering, or making decisions?: No Patient able to express need for assistance with ADLs?: Yes Does the patient have difficulty dressing or bathing?: No Independently performs ADLs?: Yes (appropriate for developmental age) Does the patient have difficulty walking or climbing stairs?: No Weakness of Legs: None Weakness of Arms/Hands: None  Home Assistive Devices/Equipment Home Assistive Devices/Equipment: None  Therapy Consults (therapy consults require a physician order) PT Evaluation Needed: No OT Evalulation Needed: No SLP Evaluation Needed: No Abuse/Neglect Assessment (Assessment to be complete while patient is alone) Abuse/Neglect Assessment Can Be Completed: Yes Physical Abuse: Denies Verbal Abuse: Denies Sexual Abuse: Denies Exploitation of patient/patient's resources: Denies Self-Neglect: Denies Values / Beliefs Cultural Requests During Hospitalization: None Spiritual Requests During Hospitalization:  None Consults Spiritual Care Consult Needed: No Social Work Consult Needed: No         Child/Adolescent Assessment Running Away Risk: Denies(Patient is an adult)  Disposition:  Disposition Initial Assessment Completed for this Encounter: Yes  On Site Evaluation by:   Reviewed with Physician:    Gunnar Fusi MS, LCAS, [place], [place], CCSI Therapeutic Triage Specialist 03/30/2018 3:54 PM

## 2018-03-31 ENCOUNTER — Other Ambulatory Visit: Payer: Self-pay

## 2018-03-31 LAB — LIPID PANEL
Cholesterol: 305 mg/dL — ABNORMAL HIGH (ref 0–200)
HDL: 29 mg/dL — AB (ref >40)
LDL CALC: UNDETERMINED mg/dL (ref 0–99)
Total CHOL/HDL Ratio: 10.5 RATIO
Triglycerides: 679 mg/dL — ABNORMAL HIGH (ref <150)
VLDL: UNDETERMINED mg/dL (ref 0–40)

## 2018-03-31 LAB — COMPREHENSIVE METABOLIC PANEL
ALT: 34 U/L (ref 0–44)
AST: 23 U/L (ref 15–41)
Albumin: 4.1 g/dL (ref 3.5–5.0)
Alkaline Phosphatase: 153 U/L — ABNORMAL HIGH (ref 38–126)
Anion gap: 8 (ref 5–15)
BILIRUBIN TOTAL: 0.5 mg/dL (ref 0.3–1.2)
BUN: 11 mg/dL (ref 6–20)
CO2: 20 mmol/L — ABNORMAL LOW (ref 22–32)
Calcium: 9.4 mg/dL (ref 8.9–10.3)
Chloride: 106 mmol/L (ref 98–111)
Creatinine, Ser: 0.92 mg/dL (ref 0.61–1.24)
GFR calc Af Amer: >60 mL/min (ref >60)
GFR calc non Af Amer: >60 mL/min (ref >60)
Glucose, Bld: 132 mg/dL — ABNORMAL HIGH (ref 70–99)
POTASSIUM: 3.9 mmol/L (ref 3.5–5.1)
Sodium: 134 mmol/L — ABNORMAL LOW (ref 135–145)
Total Protein: 7.5 g/dL (ref 6.5–8.1)

## 2018-03-31 LAB — URINALYSIS, ROUTINE W REFLEX MICROSCOPIC
BACTERIA UA: NONE SEEN
Bilirubin Urine: NEGATIVE
Glucose, UA: >=500 mg/dL — AB
Hgb urine dipstick: NEGATIVE
Ketones, ur: NEGATIVE mg/dL
Nitrite: NEGATIVE
Protein, ur: NEGATIVE mg/dL
Specific Gravity, Urine: 1.01 (ref 1.005–1.030)
WBC, UA: >50 WBC/hpf — ABNORMAL HIGH (ref 0–5)
pH: 7 (ref 5.0–8.0)

## 2018-03-31 LAB — CBC
HCT: 44.9 % (ref 39.0–52.0)
Hemoglobin: 14.9 g/dL (ref 13.0–17.0)
MCH: 30.1 pg (ref 26.0–34.0)
MCHC: 33.2 g/dL (ref 30.0–36.0)
MCV: 90.7 fL (ref 80.0–100.0)
Platelets: 308 10*3/uL (ref 150–400)
RBC: 4.95 MIL/uL (ref 4.22–5.81)
RDW: 15.8 % — AB (ref 11.5–15.5)
WBC: 7.7 10*3/uL (ref 4.0–10.5)
nRBC: 0 % (ref 0.0–0.2)

## 2018-03-31 LAB — TSH: TSH: 0.78 u[IU]/mL (ref 0.350–4.500)

## 2018-03-31 LAB — HEMOGLOBIN A1C
Hgb A1c MFr Bld: 6.9 % — ABNORMAL HIGH (ref 4.8–5.6)
Mean Plasma Glucose: 151.33 mg/dL

## 2018-03-31 LAB — VALPROIC ACID LEVEL

## 2018-03-31 MED ORDER — SERTRALINE HCL 25 MG PO TABS
50.0000 mg | ORAL_TABLET | Freq: Every day | ORAL | Status: DC
  Administered 2018-03-31: 50 mg via ORAL
  Filled 2018-03-31: qty 2

## 2018-03-31 MED ORDER — LORAZEPAM 1 MG PO TABS
1.0000 mg | ORAL_TABLET | Freq: Once | ORAL | Status: AC
  Administered 2018-03-31: 1 mg via ORAL
  Filled 2018-03-31: qty 1

## 2018-03-31 MED ORDER — SERTRALINE HCL 100 MG PO TABS
100.0000 mg | ORAL_TABLET | Freq: Every day | ORAL | Status: DC
  Administered 2018-04-01: 100 mg via ORAL
  Filled 2018-03-31: qty 1

## 2018-03-31 MED ORDER — QUETIAPINE FUMARATE 200 MG PO TABS: 200.0000 mg | ORAL_TABLET | Freq: Every day | ORAL | Status: DC

## 2018-03-31 MED ORDER — QUETIAPINE FUMARATE 200 MG PO TABS
200.0000 mg | ORAL_TABLET | Freq: Every day | ORAL | Status: DC
  Administered 2018-04-01: 200 mg via ORAL
  Filled 2018-03-31: qty 1

## 2018-03-31 MED ORDER — QUETIAPINE FUMARATE 200 MG PO TABS
600.0000 mg | ORAL_TABLET | Freq: Every day | ORAL | Status: DC
  Administered 2018-03-31: 600 mg via ORAL
  Filled 2018-03-31: qty 3

## 2018-03-31 MED ORDER — OLANZAPINE 5 MG PO TABS
5.0000 mg | ORAL_TABLET | Freq: Once | ORAL | Status: AC
  Administered 2018-03-31: 5 mg via ORAL
  Filled 2018-03-31: qty 1

## 2018-03-31 MED ORDER — ASPIRIN 81 MG PO CHEW
81.0000 mg | CHEWABLE_TABLET | Freq: Every day | ORAL | Status: DC
  Administered 2018-03-31 – 2018-04-01 (×2): 81 mg via ORAL
  Filled 2018-03-31 (×2): qty 1

## 2018-03-31 NOTE — H&P (Signed)
Psychiatric Admission Assessment Adult  Patient Identification: Michael Schmitt MRN:  [id number] Date of Evaluation:  03/31/2018 Chief Complaint:   Principal Diagnosis:  Persistent depressive disorder with suicidal ideations   Diagnosis:  Active Problems:   Depression, major, recurrent, severe with psychosis ([place]) chronic pain Benzodiazapine use disorder Alcohol use disorder HTN GERD Asthma HLD DM II Opiate use History of opiate use Chronic suicidality  History of Present Illness: Patient is a 48 year old Caucasian male peers to have frequent contact with the primary psychiatric team at this hospital.  He was admitted overnight for suicidality and depression.  Per chart review he came into the emergency department requesting medications, asking who the on-call psychiatrist was, and requesting admission to certain psychiatric facilities.  He stated that he has recently "almost cut himself" with knives, stating that at that period of time he felt like he could not "take it anymore".  He reports that his mood has been depressed for over 20 years, he states he cannot remember a period where he was "normal".  He noted that he has several psychosocial stressors that often influence his mood and cause him to have periods of being "ill", which he describes as brief periods of impulsive self-harm and rage.  The attempts are usually self-limited and involve slapping his head with his hands, and screaming out.  He reports that these periods of hitting himself started 3 or 4 years ago, after he "stopped dating".  Currently he reports feeling like hurting himself whenever he feels lonely.  He currently pan denies all substance use, with exception of tobacco. He stated that he is hoping that this admission will lead to a "correct" medication regimen for him, and that he does not want to leave until his chronic suicidality, depression, and pain have been alleviated.   Psychiatric ROS: He endorses poor  sleep, appetite mood, energy, concentration, anhedonia, depressed mood, and suicidality.  He reports these have lasted for 20 years, he denies any length of time of feeling "normal" or having an absence of the symptoms.  He denies any homicidality. He denies any symptoms consistent with mania or hypomania.  He denies any current auditory or visual hallucinations, but reports what appear to be illusions.  He reports having "anxiety", and describes this as a period of being tense which is only relieved with Klonopin.  He denies any past history of traumatic experiences, he denies any obsessional thoughts or compulsions.  He denies, delusional framework such as ideas of reference or thought broadcasting.  Total Time spent with patient: 1.5 hours  Past Psychiatric History: Patient appears to have multiple hospitalization at this hospital and nearby facilities, he appears to struggle with a long history of depressed mood, suicidality, multiple substance use disorders, and multiple medical conditions.  He reports having at least 50-60 suicidal attempts, with the most serious described as pointing a gun at himself.  He denies harming himself in a suicide attempt, but "comes close to it".  He currently reports that the most beneficial medications for him are benzodiazepines.   Is the patient at risk to self? Yes.    Has the patient been a risk to self in the past 6 months? Yes.    Has the patient been a risk to self within the distant past? Yes.    Is the patient a risk to others? No.  Has the patient been a risk to others in the past 6 months? No.  Has the patient been a risk to others within  the distant past? No.   Prior Inpatient Therapy:   Prior Outpatient Therapy:    Alcohol Screening: 1. How often do you have a drink containing alcohol?: Never 2. How many drinks containing alcohol do you have on a typical day when you are drinking?: 1 or 2 3. How often do you have six or more drinks on one occasion?:  Never AUDIT-C Score: 0 4. How often during the last year have you found that you were not able to stop drinking once you had started?: Never 5. How often during the last year have you failed to do what was normally expected from you becasue of drinking?: Never 6. How often during the last year have you needed a first drink in the morning to get yourself going after a heavy drinking session?: Never 7. How often during the last year have you had a feeling of guilt of remorse after drinking?: Never 8. How often during the last year have you been unable to remember what happened the night before because you had been drinking?: Never 9. Have you or someone else been injured as a result of your drinking?: No 10. Has a relative or friend or a doctor or another health worker been concerned about your drinking or suggested you cut down?: No Alcohol Use Disorder Identification Test Final Score (AUDIT): 0 Intervention/Follow-up: Medication Offered/Refused Substance Abuse History in the last 12 months:  Yes.   Consequences of Substance Abuse: Medical Consequences:  worsening health Family Consequences:  difficulty with social obligations Previous Psychotropic Medications: Yes  Psychological Evaluations: Yes  Past Medical History:  Past Medical History:  Diagnosis Date  . Anxiety   . Anxiety   . Arthritis    cerv. stenosis, spondylosis, HNP- lower back , has been followed in pain clinic, has  had injection s in cerv. area  . Blood dyscrasia    told that when he was young he was a" free bleeder"  . CAD (coronary artery disease)   . Cervical spondylosis without myelopathy 07/24/2014  . Cervicogenic headache 07/24/2014  . Chronic kidney disease    renal calculi- passed spontaneously  . Depression   . Diabetes mellitus without complication ([place])   . Fatty liver   . GERD (gastroesophageal reflux disease)   . Headache(784.0)   . Hyperlipidemia   . Hypertension   . Mental disorder   . MI, old   .  RLS (restless legs syndrome)    detected on sleep study  . Shortness of breath   . Ventricular fibrillation ([place]) 04/05/2016    Past Surgical History:  Procedure Laterality Date  . ANTERIOR CERVICAL DECOMP/DISCECTOMY FUSION  11/13/2011   Procedure: ANTERIOR CERVICAL DECOMPRESSION/DISCECTOMY FUSION 1 LEVEL;  Surgeon: [name], MD;  Location: [facility];  Service: Neurosurgery;  Laterality: N/A;  Anterior Cervical Decompression/discectomy Fusion. Cervical three-four.  Marland Kitchen CARDIAC CATHETERIZATION N/A 01/01/2015   Procedure: Left Heart Cath and Coronary Angiography;  Surgeon: [name], MD;  Location: [facility];  Service: Cardiovascular;  Laterality: N/A;  . CARDIAC CATHETERIZATION N/A 04/05/2016   Procedure: Left Heart Cath and Coronary Angiography;  Surgeon: Belva Crome, MD;  Location: [facility];  Service: Cardiovascular;  Laterality: N/A;  . CARDIAC CATHETERIZATION N/A 04/05/2016   Procedure: Coronary Stent Intervention;  Surgeon: Belva Crome, MD;  Location: [facility];  Service: Cardiovascular;  Laterality: N/A;  . CARDIAC CATHETERIZATION N/A 04/05/2016   Procedure: Intravascular Ultrasound/IVUS;  Surgeon: Belva Crome, MD;  Location: [facility];  Service: Cardiovascular;  Laterality: N/A;  . NASAL SINUS SURGERY     2005   Family History:  Family History  Problem Relation Age of Onset  . Prostate cancer Father   . Hypertension Mother   . Kidney Stones Mother   . Anxiety disorder Mother   . Depression Mother   . COPD Sister   . Hypertension Sister   . Diabetes Sister   . Depression Sister   . Anxiety disorder Sister   . Seizures Sister   . ADD / ADHD Son   . ADD / ADHD Daughter    Family Psychiatric  History: "I dont want to talk about it" Tobacco Screening: Have you used any form of tobacco in the last 30 days? (Cigarettes, Smokeless Tobacco, Cigars, and/or Pipes): Yes Tobacco use, Select all that apply: 5 or more cigarettes per day Are  you interested in Tobacco Cessation Medications?: Yes, will notify MD for an order Counseled patient on smoking cessation including recognizing danger situations, developing coping skills and basic information about quitting provided: Yes Social History:  Social History   Substance and Sexual Activity  Alcohol Use No  . Alcohol/week: 0.0 standard drinks   Comment: last drinked in 3 months.      Social History   Substance and Sexual Activity  Drug Use No    Additional Social History:             He reports being born and raised in [place], he dropped out of school in the 11th grade.  He has not had further education since that time.  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  He is currently disabled and collecting SSI past 5 years, he states he receives roughly 1k a month.              Allergies:   Allergies  Allergen Reactions  . Prednisone Other (See Comments)    Hypertension and makes him feel "spacey"  . Hydrocodone Other (See Comments)    Causes headaches and makes the patient irritable  . Trazodone And Nefazodone Other (See Comments)    Patient stated that this keeps him awake; "has an opposite effect on me"  . Varenicline Other (See Comments)    Suicidal thoughts   Lab Results:  Results for orders placed or performed during the hospital encounter of 03/30/18 (from the past 48 hour(s))  Urinalysis, Routine w reflex microscopic     Status: Abnormal   Collection Time: 03/30/18 11:10 AM  Result Value Ref Range   Color, Urine YELLOW (A) YELLOW   APPearance HAZY (A) CLEAR   Specific Gravity, Urine 1.010 1.005 - 1.030   pH 7.0 5.0 - 8.0   Glucose, UA >=500 (A) NEGATIVE mg/dL   Hgb urine dipstick NEGATIVE NEGATIVE   Bilirubin Urine NEGATIVE NEGATIVE   Ketones, ur NEGATIVE NEGATIVE mg/dL   Protein, ur NEGATIVE NEGATIVE mg/dL   Nitrite NEGATIVE NEGATIVE   Leukocytes, UA TRACE (A) NEGATIVE   RBC / HPF 0-5 0 - 5  RBC/hpf   WBC, UA >50 (H) 0 - 5 WBC/hpf   Bacteria, UA NONE SEEN NONE SEEN   Squamous Epithelial / LPF 0-5 0 - 5   Amorphous Crystal PRESENT     Comment: Performed at [facility], [address], [place], [place] [address]  CBC     Status: Abnormal   Collection Time: 03/31/18 10:43 AM  Result Value  Ref Range   WBC 7.7 4.0 - 10.5 K/uL   RBC 4.95 4.22 - 5.81 MIL/uL   Hemoglobin 14.9 13.0 - 17.0 g/dL   HCT 44.9 39.0 - 52.0 %   MCV 90.7 80.0 - 100.0 fL   MCH 30.1 26.0 - 34.0 pg   MCHC 33.2 30.0 - 36.0 g/dL   RDW 15.8 (H) 11.5 - 15.5 %   Platelets 308 150 - 400 K/uL   nRBC 0.0 0.0 - 0.2 %    Comment: Performed at [facility], [address] [place], [place] [address]  Comprehensive metabolic panel     Status: Abnormal   Collection Time: 03/31/18 10:43 AM  Result Value Ref Range   Sodium 134 (L) 135 - 145 mmol/L   Potassium 3.9 3.5 - 5.1 mmol/L   Chloride 106 98 - 111 mmol/L   CO2 20 (L) 22 - 32 mmol/L   Glucose, Bld 132 (H) 70 - 99 mg/dL   BUN 11 6 - 20 mg/dL   Creatinine, Ser 0.92 0.61 - 1.24 mg/dL   Calcium 9.4 8.9 - 10.3 mg/dL   Total Protein 7.5 6.5 - 8.1 g/dL   Albumin 4.1 3.5 - 5.0 g/dL   AST 23 15 - 41 U/L   ALT 34 0 - 44 U/L   Alkaline Phosphatase 153 (H) 38 - 126 U/L   Total Bilirubin 0.5 0.3 - 1.2 mg/dL   GFR calc non Af Amer >60 >60 mL/min   GFR calc Af Amer >60 >60 mL/min   Anion gap 8 5 - 15    Comment: Performed at [facility], [address] [place], [place] [address]  Lipid panel     Status: Abnormal   Collection Time: 03/31/18 10:43 AM  Result Value Ref Range   Cholesterol 305 (H) 0 - 200 mg/dL   Triglycerides 679 (H) <150 mg/dL   HDL 29 (L) >40 mg/dL   Total CHOL/HDL Ratio 10.5 RATIO   VLDL UNABLE TO CALCULATE IF TRIGLYCERIDE OVER 400 mg/dL 0 - 40 mg/dL   LDL Cholesterol UNABLE TO CALCULATE IF TRIGLYCERIDE OVER 400 mg/dL 0 - 99 mg/dL    Comment:        Total Cholesterol/HDL:CHD Risk Coronary Heart Disease Risk Table                      Men   Women  1/2 Average Risk   3.4   3.3  Average Risk       5.0   4.4  2 X Average Risk   9.6   7.1  3 X Average Risk  23.4   11.0        Use the calculated Patient Ratio above and the CHD Risk Table to determine the patient's CHD Risk.        ATP III CLASSIFICATION (LDL):  <100     mg/dL   Optimal  100-129  mg/dL   Near or Above                    Optimal  130-159  mg/dL   Borderline  160-189  mg/dL   High  >190     mg/dL   Very High Performed at [facility], [address] [place], [place] [address]   TSH     Status: None   Collection Time: 03/31/18 10:43 AM  Result Value Ref Range   TSH 0.780 0.350 - 4.500 uIU/mL    Comment: Performed  by a 3rd Generation assay with a functional sensitivity of <=0.01 uIU/mL. Performed at [facility], [address] [place], [place] [address]   Valproic acid level     Status: Abnormal   Collection Time: 03/31/18 10:43 AM  Result Value Ref Range   Valproic Acid Lvl <10 (L) 50.0 - 100.0 ug/mL    Comment: RESULTS VERIFIED SNJ Performed at [facility], [address] [place], [place] [address]     Blood Alcohol level:  Lab Results  Component Value Date   [facility] <10 03/30/2018   ETH <10 51/88/4166    Metabolic Disorder Labs:  Lab Results  Component Value Date   HGBA1C 6.0 (H) 03/13/2017   MPG 126 03/13/2017   MPG 177 09/12/2016   Lab Results  Component Value Date   PROLACTIN 18.1 (H) 09/12/2016   PROLACTIN 6.5 05/27/2016   Lab Results  Component Value Date   CHOL 305 (H) 03/31/2018   TRIG 679 (H) 03/31/2018   HDL 29 (L) 03/31/2018   CHOLHDL 10.5 03/31/2018   VLDL UNABLE TO CALCULATE IF TRIGLYCERIDE OVER 400 mg/dL 03/31/2018   LDLCALC UNABLE TO CALCULATE IF TRIGLYCERIDE OVER 400 mg/dL 03/31/2018   LDLCALC 22 03/13/2017    Current Medications: Current Facility-Administered Medications  Medication Dose Route Frequency Provider Last Rate Last Dose  . acetaminophen  (TYLENOL) tablet 650 mg  650 mg Oral Q6H PRN Patience Musca, MD      . alum & mag hydroxide-simeth (MAALOX/MYLANTA) 200-200-20 MG/5ML suspension 30 mL  30 mL Oral Q4H PRN Abran Richard C, MD      . carvedilol (COREG) tablet 6.25 mg  6.25 mg Oral BID WC Patience Musca, MD   6.25 mg at 03/31/18 0808  . gabapentin (NEURONTIN) capsule 900 mg  900 mg Oral TID Patience Musca, MD   900 mg at 03/31/18 1209  . hydrOXYzine (ATARAX/VISTARIL) tablet 50 mg  50 mg Oral Q4H PRN Patience Musca, MD      . lamoTRIgine (LAMICTAL) tablet 100 mg  100 mg Oral Daily Patience Musca, MD   100 mg at 03/31/18 0630  . magnesium hydroxide (MILK OF MAGNESIA) suspension 30 mL  30 mL Oral Daily PRN Abran Richard C, MD      . metFORMIN (GLUCOPHAGE) tablet 500 mg  500 mg Oral BID WC Patience Musca, MD   500 mg at 03/31/18 0809  . nicotine (NICODERM CQ - dosed in mg/24 hours) patch 21 mg  21 mg Transdermal Q0600 Patience Musca, MD   21 mg at 03/31/18 0550  . QUEtiapine (SEROQUEL) tablet 200 mg  200 mg Oral QHS Solon Palm R, DO      . sertraline (ZOLOFT) tablet 50 mg  50 mg Oral Daily Solon Palm R, DO   50 mg at 03/31/18 0945   PTA Medications: Medications Prior to Admission  Medication Sig Dispense Refill Last Dose  . ARIPiprazole (ABILIFY) 30 MG tablet Take 30 mg by mouth at bedtime.   Not Taking at Unknown time  . aspirin 81 MG EC tablet Take 1 tablet (81 mg total) by mouth daily. For heart health 30 tablet 1 Unknown at Unknown  . atorvastatin (LIPITOR) 80 MG tablet Take 1 tablet (80 mg total) by mouth daily at 6 PM. For high cholesterol (Patient taking differently: Take 80 mg by mouth daily. For high cholesterol) 30 tablet 0 Unknown at Unknown  . busPIRone (BUSPAR) 15 MG tablet Take 15 mg by mouth 3 (  three) times daily.   Unknown at Unknown  . carbamazepine (TEGRETOL) 200 MG tablet Take 1 tablet (200 mg total) by mouth daily. (Patient not taking: Reported on 03/30/2018) 1 tablet 0  Not Taking at Unknown time  . carvedilol (COREG) 6.25 MG tablet Take 1 tablet (6.25 mg total) by mouth 2 (two) times daily with a meal. For high blood pressure 60 tablet 0 Unknown at Unknown  . chlorproMAZINE (THORAZINE) 200 MG tablet Take 400 mg by mouth at bedtime.   0 Unknown at Unknown  . clonazePAM (KLONOPIN) 0.5 MG tablet Take 0.5 mg by mouth 3 (three) times daily as needed for anxiety.   prn at prn  . clonazePAM (KLONOPIN) 1 MG tablet Take 1 mg by mouth 3 (three) times daily.    Unknown at Unknown  . clopidogrel (PLAVIX) 75 MG tablet Take 1 tablet (75 mg total) by mouth daily. For blood clot prevention   Unknown at Unknown  . divalproex (DEPAKOTE) 500 MG DR tablet Take 1 tablet (500 mg total) by mouth at bedtime. (Patient taking differently: Take 500 mg by mouth 2 (two) times daily. ) 1 tablet 0 Unknown at Unknown  . doxepin (SINEQUAN) 100 MG capsule Take 100 mg by mouth at bedtime.   Unknown at Unknown  . doxepin (SINEQUAN) 50 MG capsule Take 1 capsule (50 mg total) by mouth at bedtime. (Patient not taking: Reported on 03/30/2018) 1 capsule 0 Not Taking at Unknown  . escitalopram (LEXAPRO) 10 MG tablet Take 10 mg by mouth daily.   Not Taking at Unknown  . gabapentin (NEURONTIN) 300 MG capsule Take 1 capsule (300 mg total) by mouth 3 (three) times daily. For agitation/neuropathic pain (Patient taking differently: Take 900 mg by mouth 3 (three) times daily. For agitation/neuropathic pain) 90 capsule 0 Unknown at Unknown  . gabapentin (NEURONTIN) 600 MG tablet Take 600 mg by mouth 3 (three) times daily.    Not Taking at Unknown  . ibuprofen (ADVIL,MOTRIN) 800 MG tablet Take 2,400 mg by mouth 3 (three) times daily as needed.   prn at prn  . lamoTRIgine (LAMICTAL) 25 MG tablet Take 50 mg by mouth at bedtime.   Unknown at Unknown  . lithium carbonate (ESKALITH) 450 MG CR tablet Take 900 mg by mouth 2 (two) times daily.   Not Taking at Unknown time  . lurasidone (LATUDA) 80 MG TABS tablet Take 1 tablet  (80 mg total) by mouth at bedtime. (Patient not taking: Reported on 03/07/2018) 1 tablet 0 Not Taking at Unknown time  . Melatonin 3 MG CAPS Take 9 mg by mouth at bedtime.   Unknown at Unknown  . metFORMIN (GLUCOPHAGE) 500 MG tablet Take 1 tablet (500 mg total) by mouth 2 (two) times daily with a meal. For diabetes management   Unknown at Unknown  . mirtazapine (REMERON) 45 MG tablet Take 45 mg by mouth at bedtime.   Unknown at Unknown  . mupirocin cream (BACTROBAN) 2 % Apply topically 2 (two) times daily. (Patient not taking: Reported on 01/04/2018) 15 g 0 Not Taking at Unknown time  . nicotine (NICODERM CQ - DOSED IN MG/24 HOURS) 21 mg/24hr patch Place 1 patch (21 mg total) onto the skin daily. (May buy from over the counter): For smoking cessation (Patient not taking: Reported on 01/04/2018) 28 patch 0 Not Taking at Unknown time  . nitroGLYCERIN (NITROSTAT) 0.4 MG SL tablet Place 1 tablet (0.4 mg total) under the tongue every 5 (five) minutes x 3 doses as needed.  For chest pain (Patient not taking: Reported on 01/04/2018) 10 tablet 0 Not Taking at Unknown time  . OLANZapine (ZYPREXA) 20 MG tablet Take 20 mg by mouth at bedtime.   Unknown at Unknown  . Oxcarbazepine (TRILEPTAL) 300 MG tablet Take 1 tablet (300 mg total) by mouth at bedtime. For mood stabilization (Patient not taking: Reported on 03/07/2018) 1 tablet 0 Not Taking at Unknown time  . QUEtiapine (SEROQUEL) 200 MG tablet Take 400 mg by mouth 2 (two) times daily.   Unknown at Unknown  . QUEtiapine (SEROQUEL) 300 MG tablet Take 600 mg by mouth at bedtime.   0 Unknown at Unknown  . sertraline (ZOLOFT) 100 MG tablet Take 100 mg by mouth at bedtime.   Unknown at Unknown  . traZODone (DESYREL) 100 MG tablet Take 1 tablet (100 mg total) by mouth at bedtime. (Patient not taking: Reported on 03/07/2018) 1 tablet 0 Not Taking at Unknown time  . TRINTELLIX 10 MG TABS tablet Take 1 tablet (10 mg total) by mouth daily. For depression (Patient not  taking: Reported on 01/04/2018) 30 tablet 0 Not Taking at Unknown time    Musculoskeletal: Strength & Muscle Tone: within normal limits Gait & Station: normal Patient leans: N/A  Psychiatric Specialty Exam: Physical Exam  Constitutional: He is oriented to person, place, and time. He appears well-developed and well-nourished.  HENT:  Head: Normocephalic and atraumatic.  Eyes: EOM are normal.  Respiratory: Effort normal.  Musculoskeletal: Normal range of motion.  Neurological: He is alert and oriented to person, place, and time.  Notable oralbuccal movements and titubation consistent with possible TD  Skin: Skin is dry.    Review of Systems  Constitutional: Positive for malaise/fatigue. Negative for fever and weight loss.  HENT: Negative for congestion and sore throat.   Eyes: Positive for blurred vision. Negative for pain and redness.  Respiratory: Negative for cough, shortness of breath and wheezing.   Cardiovascular: Negative for chest pain and palpitations.  Gastrointestinal: Positive for diarrhea. Negative for abdominal pain, blood in stool, melena, nausea and vomiting.  Musculoskeletal: Positive for joint pain and neck pain.  Skin: Negative for itching and rash.  Neurological: Negative for dizziness, tingling, tremors, sensory change, speech change, weakness and headaches.  Psychiatric/Behavioral: Positive for suicidal ideas.    Blood pressure (!) 142/98, pulse 97, temperature 98.1 F (36.7 C), temperature source Oral, resp. rate 18, height 6\' 2"  (1.88 m), weight 96.6 kg, SpO2 97 %.Body mass index is 27.35 kg/m.  General Appearance: Casual and Disheveled  Eye Contact:  Fair  Speech:  Normal Rate  Volume:  Normal  Mood:  "suicidal"  Affect:  Constricted  Thought Process:  Goal Directed and Linear  Orientation:  Full (Time, Place, and Person)  Thought Content:  devoid of delusional material, AH or VH  Suicidal Thoughts:  Yes.  without intent/plan  Homicidal Thoughts:   No  Memory:  Immediate;   Good Recent;   Good  Judgement:  Poor  Insight:  Lacking  Psychomotor Activity:  Normal  Concentration:  Concentration: Good  Recall:  Good  Fund of Knowledge:  Good  Language:  Good  Akathisia:  No  Handed:  Right  AIMS (if indicated):     Assets:  Housing Social Support Transportation  ADL's:  Intact  Cognition:  WNL  Sleep:       Treatment Plan Summary: Daily contact with patient to assess and evaluate symptoms and progress in treatment and Medication management  This is a 48 year old  male patient who appears to have a long history of persistent depressive disorder, among other psychiatric conditions such as substance use and potential personality traits, admitted voluntarily to the inpatient psychiatric ward for safety. On the surface the antipsychotic medications appear to be in place for mood reactivity/impulstivity or potentially as adjunct treatment for his depressive symptoms. Would recommend finding alternative dosing/medication choice considering his TD and lack of criteria for Schizophrenia or bipolar disorder. He appears to be chronically suicidal, thankfully does not have suicide attempts that have led him to significant harm. He appears to be very fixated on medications that have short acting and potent effects, which do not appear to be in this patient's best interest this time. He stated several times that a physician in the emergency room promised to place him on Klonopin. We will have to monitor closely for the potential of acting out in order to receive medications. Otherwise we will continue his known home medications as below.  General Continue admission to the theatric ward for safety and treatment 15-minute safety checks  Depression/Mood stabilization Zoloft 100mg  PO QHS Seroquel 200mg  PO QAM and 600mg  QHS Lamictal 100mg  PO QHS  HTN Coreg 6.5 mg PO Q daily  Previous MI Asa 81mg  po q daily  DMII Metformin 500mg  Bid Gabapentin  900mg  TID  Tobacoo use nicoderm 21mg  patch  Comfort medications  Disposition To return home when stable  Observation Level/Precautions:  15 minute checks  Laboratory:  CBC Chemistry Profile Folic Acid HbAIC UDS Vitamin B-12  Psychotherapy:    Medications:    Consultations:    Discharge Concerns:  safety  Estimated LOS: 2-3 days  Other:     Physician Treatment Plan for Primary Diagnosis: Suicidality  Long Term Goal(s): Improvement in symptoms so as ready for discharge  Short Term Goals: Ability to identify changes in lifestyle to reduce recurrence of condition will improve, Ability to verbalize feelings will improve, Ability to disclose and discuss suicidal ideas, Ability to demonstrate self-control will improve, Ability to identify and develop effective coping behaviors will improve, Ability to maintain clinical measurements within normal limits will improve, Compliance with prescribed medications will improve and Ability to identify triggers associated with substance abuse/mental health issues will improve  I certify that inpatient services furnished can reasonably be expected to improve the patient's condition.    Chong Sicilian, DO 1/5/20203:58 PM

## 2018-03-31 NOTE — [facility] Suicide Risk Assessment (Signed)
[facility] Admission Suicide Risk Assessment   Nursing information obtained from:  Patient Demographic factors:  Male, Caucasian, Unemployed, Low socioeconomic status Current Mental Status:  Suicidal ideation indicated by patient(PASSIVE SUICIDAL THOUGHTS) Loss Factors:  Financial problems / change in socioeconomic status Historical Factors:  Impulsivity Risk Reduction Factors:  Responsible for children under 47 years of age  Total Time spent with patient: 1.5 hours Principal Problem: Suicidal ideation  Diagnosis:  Active Problems:   Depression, major, recurrent, severe with psychosis ([place]) chronic pain Benzodiazapine use disorder Alcohol use disorder HTN GERD Asthma HLD DM II Opiate use History of opiate use Chronic suicidality  Subjective Data: Suicidal ideation  Continued Clinical Symptoms:  Alcohol Use Disorder Identification Test Final Score (AUDIT): 0 The "Alcohol Use Disorders Identification Test", Guidelines for Use in Primary Care, Second Edition.  World Pharmacologist [facility]). Score between 0-7:  no or low risk or alcohol related problems. Score between 8-15:  moderate risk of alcohol related problems. Score between 16-19:  high risk of alcohol related problems. Score 20 or above:  warrants further diagnostic evaluation for alcohol dependence and treatment.   CLINICAL FACTORS:   Depression:   Hopelessness Dysthymia Alcohol/Substance Abuse/Dependencies Chronic Pain More than one psychiatric diagnosis Unstable or Poor Therapeutic Relationship Previous Psychiatric Diagnoses and Treatments Medical Diagnoses and Treatments/Surgeries   Musculoskeletal: Strength & Muscle Tone: within normal limits Gait & Station: normal Patient leans: N/A Psychiatric Specialty Exam: Physical Exam  Constitutional: He is oriented to person, place, and time. He appears well-developed and well-nourished.  HENT:  Head: Normocephalic and atraumatic.  Eyes: EOM are normal.   Respiratory: Effort normal.  Musculoskeletal: Normal range of motion.  Neurological: He is alert and oriented to person, place, and time.  Notable oralbuccal movements and titubation consistent with possible TD  Skin: Skin is dry.    Review of Systems  Constitutional: Positive for malaise/fatigue. Negative for fever and weight loss.  HENT: Negative for congestion and sore throat.   Eyes: Positive for blurred vision. Negative for pain and redness.  Respiratory: Negative for cough, shortness of breath and wheezing.   Cardiovascular: Negative for chest pain and palpitations.  Gastrointestinal: Positive for diarrhea. Negative for abdominal pain, blood in stool, melena, nausea and vomiting.  Musculoskeletal: Positive for joint pain and neck pain.  Skin: Negative for itching and rash.  Neurological: Negative for dizziness, tingling, tremors, sensory change, speech change, weakness and headaches.  Psychiatric/Behavioral: Positive for suicidal ideas.    Blood pressure (!) 142/98, pulse 97, temperature 98.1 F (36.7 C), temperature source Oral, resp. rate 18, height 6\' 2"  (1.88 m), weight 96.6 kg, SpO2 97 %.Body mass index is 27.35 kg/m.  General Appearance: Casual and Disheveled  Eye Contact:  Fair  Speech:  Normal Rate  Volume:  Normal  Mood:  "suicidal"  Affect:  Constricted  Thought Process:  Goal Directed and Linear  Orientation:  Full (Time, Place, and Person)  Thought Content:  devoid of delusional material, AH or VH  Suicidal Thoughts:  Yes.  without intent/plan  Homicidal Thoughts:  No  Memory:  Immediate;   Good Recent;   Good  Judgement:  Poor  Insight:  Lacking  Psychomotor Activity:  Normal  Concentration:  Concentration: Good  Recall:  Good  Fund of Knowledge:  Good  Language:  Good  Akathisia:  No  Handed:  Right  AIMS (if indicated):     Assets:  Housing Social Support Transportation  ADL's:  Intact  Cognition:  WNL  Sleep:  COGNITIVE FEATURES  THAT CONTRIBUTE TO RISK:  Closed-mindedness and Thought constriction (tunnel vision)    SUICIDE RISK:   Moderate:  Frequent suicidal ideation with limited intensity, and duration, some specificity in terms of plans, no associated intent, good self-control, limited dysphoria/symptomatology, some risk factors present, and identifiable protective factors, including available and accessible social support.  PLAN OF CARE: Admit to the locked inpatient psychiatric ward for safety, evaluation and treatment.  Please see the H&P for full plan and medication list  I certify that inpatient services furnished can reasonably be expected to improve the patient's condition.   Chong Sicilian, DO 03/31/2018, 4:50 PM

## 2018-03-31 NOTE — Plan of Care (Signed)
No psychotic symptoms observed this evening. Pt. Complaint with medications when brought to his room. Pt. Monitored for safety on the unit by staff per MD orders. Pt. Reports passive suicidal thoughts, but can contract for safety.    Problem: Education: Goal: Will be free of psychotic symptoms Outcome: Progressing   Problem: Health Behavior/Discharge Planning: Goal: Compliance with prescribed medication regimen will improve Outcome: Progressing   Problem: Safety: Goal: Ability to remain free from injury will improve Outcome: Progressing   Problem: Self-Concept: Goal: Ability to disclose and discuss suicidal ideas will improve Outcome: Progressing

## 2018-03-31 NOTE — [facility] Group Notes (Signed)
LCSW Group Therapy Note   03/31/2018 1:15pm   Type of Therapy and Topic:  Group Therapy:  Positive Affirmations   Participation Level:  Did Not Attend  Description of Group: This group addressed positive affirmation toward self and others. Patients went around the room and identified two positive things about themselves and two positive things about a peer in the room. Patients reflected on how it felt to share something positive with others, to identify positive things about themselves, and to hear positive things from others. Patients were encouraged to have a daily reflection of positive characteristics or circumstances.  Therapeutic Goals 1. Patient will verbalize two of their positive qualities 2. Patient will demonstrate empathy for others by stating two positive qualities about a peer in the group 3. Patient will verbalize their feelings when voicing positive self affirmations and when voicing positive affirmations of others 4. Patients will discuss the potential positive impact on their wellness/recovery of focusing on positive traits of self and others. Summary of Patient Progress:    Therapeutic Modalities Cognitive Behavioral Therapy Motivational Interviewing  Trish Mage, [place] 03/31/2018 1:11 PM

## 2018-03-31 NOTE — Progress Notes (Signed)
D: Pt. Isolative and withdrawn to his room the entire shift. Pt. Is minimal and forwards little. Pt. Reports passive suicidal thoughts, but does not care to express any other feelings. Pt. Able to contract for safety.   A: Q x 15 minute observation checks were completed for safety. Patient was provided with education, but is non-accepting of this. Patient was given/offered medications per orders. Patient  was encourage to attend groups, participate in unit activities and continue with plan of care. Pt. Chart and plans of care reviewed. Pt. Given support and encouragement.   R: Patient is complaint with medication when they are brought to his room, but resistant to care, not willing to complete ecg per MD orders. Will make day shift aware of this need for ecg during hand off report.              Precautionary checks every 15 minutes for safety maintained, room free of safety hazards, patient sustains no injury or falls during this shift. Will endorse care to next shift.

## 2018-03-31 NOTE — Progress Notes (Signed)
Admission Note:  59yr male who presents voluntary commitment  in no acute distress for the treatment of SI and Depression. Patient appears flat and sad, affect is blunted he appears tense, but  was calm and cooperative with admission process. Patient presents with passive SI and contracts for safety upon admission, he denies HI.AVH no bizarre behavior noted.. Patient was noted to have some involuntary head movement. Patient's thoughts are organized and coherent he explained he came to the hospital because he felt suicidal and also worsening depression.  Patient repeatedly told writer during assessment that he was concerned about insomnia because he has not been able to sleep in days. Md on call notified and Seroquel 600mg  once was ordered.   Patient  has Past medical Hx of Depression, DM without complication, HTN, Substance Abuse, asthma and CAD to mention a few. Patient's CBG was 159 upon admission.  Skin was assessed and found to be warm dry and intact, some old scar on his back from past surgeries. Patient was also searched and no contraband found, POC and unit policies explained and understanding verbalized, consent obtained. Food and fluids offered, and fluids accepted.15 minutes safety checks maintained will continue to monitor.

## 2018-03-31 NOTE — Tx Team (Signed)
Initial Treatment Plan 03/31/2018 3:36 AM Michael Schmitt Bensinger [id number]    PATIENT STRESSORS: Financial difficulties Medication change or noncompliance   PATIENT STRENGTHS: Active sense of humor General fund of knowledge   PATIENT IDENTIFIED PROBLEMS: PSYCHOSIS    SUICIDAL IDEATION                   DISCHARGE CRITERIA:  Improved stabilization in mood, thinking, and/or behavior Motivation to continue treatment in a less acute level of care  PRELIMINARY DISCHARGE PLAN: Outpatient therapy  PATIENT/FAMILY INVOLVEMENT: This treatment plan has been presented to and reviewed with the patient, [name],  The patient and family have been given the opportunity to ask questions and make suggestions.  Harl Bowie, RN 03/31/2018, 3:36 AM

## 2018-03-31 NOTE — Plan of Care (Signed)
Patient is mildly agitated during assessment. Patient reports passive SI, without a plan, patient is on 15 min rounds and safety will be maintained. Patient is without complaint of pain at this time. Patient did not complete a daily inventory. Patient had multiple complaints of milieu. Patient states "You will see how I act if I don't get what I want." Patient is reassured multiple times. Patient is adherent with scheduled medications. Patient is mainly in room and is isolative to self.    Problem: Education: Goal: Knowledge of the prescribed therapeutic regimen will improve Outcome: Progressing   Problem: Coping: Goal: Will verbalize feelings Outcome: Progressing   Problem: Health Behavior/Discharge Planning: Goal: Compliance with prescribed medication regimen will improve Outcome: Progressing   Problem: Activity: Goal: Will verbalize the importance of balancing activity with adequate rest periods Outcome: Not Progressing   Problem: Education: Goal: Will be free of psychotic symptoms Outcome: Not Progressing   Problem: Role Relationship: Goal: Ability to interact with others will improve Outcome: Not Progressing

## 2018-03-31 NOTE — Plan of Care (Signed)
  Problem: Activity: Goal: Will verbalize the importance of balancing activity with adequate rest periods Outcome: Progressing  Patient is coping with balancing rest and activity

## 2018-04-01 ENCOUNTER — Other Ambulatory Visit: Payer: Self-pay | Admitting: Psychiatry

## 2018-04-01 DIAGNOSIS — F333 Major depressive disorder, recurrent, severe with psychotic symptoms: Principal | ICD-10-CM

## 2018-04-01 LAB — GLUCOSE, CAPILLARY: GLUCOSE-CAPILLARY: 159 mg/dL — AB (ref 70–99)

## 2018-04-01 MED ORDER — LAMOTRIGINE 100 MG PO TABS: 100.0000 mg | ORAL_TABLET | Freq: Every day | ORAL | 0 refills | Status: DC

## 2018-04-01 MED ORDER — GABAPENTIN 300 MG PO CAPS: 900.0000 mg | ORAL_CAPSULE | Freq: Three times a day (TID) | ORAL | 0 refills | Status: DC

## 2018-04-01 MED ORDER — QUETIAPINE FUMARATE 300 MG PO TABS: 600.0000 mg | ORAL_TABLET | Freq: Every day | ORAL | 0 refills | Status: DC

## 2018-04-01 MED ORDER — HALOPERIDOL 5 MG PO TABS: 10.0000 mg | ORAL_TABLET | Freq: Four times a day (QID) | ORAL | Status: DC | PRN

## 2018-04-01 MED ORDER — DIPHENHYDRAMINE HCL 50 MG/ML IJ SOLN
INTRAMUSCULAR | Status: AC
  Administered 2018-04-01: 50 mg
  Filled 2018-04-01: qty 1

## 2018-04-01 MED ORDER — LORAZEPAM 2 MG PO TABS: 2.0000 mg | ORAL_TABLET | Freq: Four times a day (QID) | ORAL | Status: DC | PRN

## 2018-04-01 MED ORDER — QUETIAPINE FUMARATE 200 MG PO TABS: 200.0000 mg | ORAL_TABLET | Freq: Every day | ORAL | 0 refills | Status: DC

## 2018-04-01 MED ORDER — HALOPERIDOL LACTATE 5 MG/ML IJ SOLN
10.0000 mg | Freq: Four times a day (QID) | INTRAMUSCULAR | Status: DC | PRN
  Administered 2018-04-01: 10 mg via INTRAMUSCULAR

## 2018-04-01 MED ORDER — DIPHENHYDRAMINE HCL 50 MG/ML IJ SOLN
50.0000 mg | Freq: Four times a day (QID) | INTRAMUSCULAR | Status: DC | PRN
  Administered 2018-04-01: 50 mg via INTRAMUSCULAR

## 2018-04-01 MED ORDER — DIPHENHYDRAMINE HCL 25 MG PO CAPS: 50.0000 mg | ORAL_CAPSULE | Freq: Four times a day (QID) | ORAL | Status: DC | PRN

## 2018-04-01 MED ORDER — LORAZEPAM 2 MG/ML IJ SOLN
INTRAMUSCULAR | Status: AC
  Administered 2018-04-01: 2 mg
  Filled 2018-04-01: qty 1

## 2018-04-01 MED ORDER — HALOPERIDOL LACTATE 5 MG/ML IJ SOLN
INTRAMUSCULAR | Status: AC
  Administered 2018-04-01: 09:00:00
  Filled 2018-04-01: qty 2

## 2018-04-01 MED ORDER — LORAZEPAM 2 MG/ML IJ SOLN
2.0000 mg | Freq: Four times a day (QID) | INTRAMUSCULAR | Status: DC | PRN
  Administered 2018-04-01: 2 mg via INTRAMUSCULAR

## 2018-04-01 NOTE — Discharge Summary (Signed)
Physician Discharge Summary Note  Patient:  Michael Schmitt is an 48 y.o., male MRN:  [id number] DOB:  [birth year] Patient phone:  [phone] (home)  Patient address:   [place] [address],  Total Time spent with patient: 45 minutes  Date of Admission:  03/30/2018 Date of Discharge: April 01, 2018  Reason for Admission: Patient admitted through the emergency room where he was reporting homicidal and suicidal ideation chronic depression and anxiety.  Patient was thought at the time to meet criteria for hospitalization  Principal Problem: Depression, major, recurrent, severe with psychosis ([place]) Discharge Diagnoses: Principal Problem:   Depression, major, recurrent, severe with psychosis ([place]) Active Problems:   Chronic Suicidal ideation   Generalized anxiety disorder   Uncontrolled type 2 diabetes mellitus without complication, without long-term current use of insulin (HCC)   Chronic pain syndrome   HTN (hypertension)   Chronic neck pain (Primary Area of Pain) (Left)   Personality disorder (HCC)   Past Psychiatric History: Long history of mental health problems.  Long history of anxiety and depressive symptoms which have been very resistant to treatment.  Partially thought to be related to personality disorder features which have worsened his situation.  Patient has a past history of heavy alcohol abuse but has been keeping that under control recently.  He does have a history of several other chronic medical problems.  He does have a history of prior suicide attempts.  Experience working with the patient has shown that inpatient hospitalization has done nothing to change the overall course of his illness.  Medications have been partially effective.  Patient has often been resistant to follow-up with therapy despite recommendations.  Past Medical History:  Past Medical History:  Diagnosis Date  . Anxiety   . Anxiety   . Arthritis    cerv. stenosis, spondylosis, HNP-  lower back , has been followed in pain clinic, has  had injection s in cerv. area  . Blood dyscrasia    told that when he was young he was a" free bleeder"  . CAD (coronary artery disease)   . Cervical spondylosis without myelopathy 07/24/2014  . Cervicogenic headache 07/24/2014  . Chronic kidney disease    renal calculi- passed spontaneously  . Depression   . Diabetes mellitus without complication ([place])   . Fatty liver   . GERD (gastroesophageal reflux disease)   . Headache(784.0)   . Hyperlipidemia   . Hypertension   . Mental disorder   . MI, old   . RLS (restless legs syndrome)    detected on sleep study  . Shortness of breath   . Ventricular fibrillation ([place]) 04/05/2016    Past Surgical History:  Procedure Laterality Date  . ANTERIOR CERVICAL DECOMP/DISCECTOMY FUSION  11/13/2011   Procedure: ANTERIOR CERVICAL DECOMPRESSION/DISCECTOMY FUSION 1 LEVEL;  Surgeon: [name], MD;  Location: [facility];  Service: Neurosurgery;  Laterality: N/A;  Anterior Cervical Decompression/discectomy Fusion. Cervical three-four.  Marland Kitchen CARDIAC CATHETERIZATION N/A 01/01/2015   Procedure: Left Heart Cath and Coronary Angiography;  Surgeon: [name], MD;  Location: [facility];  Service: Cardiovascular;  Laterality: N/A;  . CARDIAC CATHETERIZATION N/A 04/05/2016   Procedure: Left Heart Cath and Coronary Angiography;  Surgeon: Belva Crome, MD;  Location: [facility];  Service: Cardiovascular;  Laterality: N/A;  . CARDIAC CATHETERIZATION N/A 04/05/2016   Procedure: Coronary Stent Intervention;  Surgeon: Belva Crome, MD;  Location: [facility];  Service: Cardiovascular;  Laterality:  N/A;  . CARDIAC CATHETERIZATION N/A 04/05/2016   Procedure: Intravascular Ultrasound/IVUS;  Surgeon: Belva Crome, MD;  Location: [facility];  Service: Cardiovascular;  Laterality: N/A;  . NASAL SINUS SURGERY     2005   Family History:  Family History  Problem Relation Age of Onset  .  Prostate cancer Father   . Hypertension Mother   . Kidney Stones Mother   . Anxiety disorder Mother   . Depression Mother   . COPD Sister   . Hypertension Sister   . Diabetes Sister   . Depression Sister   . Anxiety disorder Sister   . Seizures Sister   . ADD / ADHD Son   . ADD / ADHD Daughter    Family Psychiatric  History: Anxiety and ADHD Social History:  Social History   Substance and Sexual Activity  Alcohol Use No  . Alcohol/week: 0.0 standard drinks   Comment: last drinked in 3 months.      Social History   Substance and Sexual Activity  Drug Use No    Social History   Socioeconomic History  . Marital status: Widowed    Spouse name: Not on file  . Number of children: 3  . Years of education: 17  . Highest education level: Not on file  Occupational History    Comment: unemployed  Social Needs  . Financial resource strain: Not on file  . Food insecurity:    Worry: Not on file    Inability: Not on file  . Transportation needs:    Medical: Not on file    Non-medical: Not on file  Tobacco Use  . Smoking status: Current Every Day Smoker    Packs/day: 2.00    Years: 17.00    Pack years: 34.00    Types: Cigarettes, Cigars  . Smokeless tobacco: Former Systems developer  . Tobacco comment: occassional snuff   Substance and Sexual Activity  . Alcohol use: No    Alcohol/week: 0.0 standard drinks    Comment: last drinked in 3 months.   . Drug use: No  . Sexual activity: Not Currently  Lifestyle  . Physical activity:    Days per week: Not on file    Minutes per session: Not on file  . Stress: Not on file  Relationships  . Social connections:    Talks on phone: Not on file    Gets together: Not on file    Attends religious service: Not on file    Active member of club or organization: Not on file    Attends meetings of clubs or organizations: Not on file    Relationship status: Not on file  Other Topics Concern  . Not on file  Social History Narrative   Patient  is right handed.   Patient drinks 2 sodas daily.    Hospital Course: Patient was admitted to the hospital.  15-minute checks.  Did not attempt to kill himself.  Was not violent with others.  As is his routine he on occasion would smack himself on the head with his hand and cry out loudly.  Patient says he does this when he gets frustrated.  He has been resistant to trying to work on alternative methods of dealing with his frustration.  Patient did have some medicine changes.  Seroquel dose was increased.  Lamotrigine dose was increased.  Otherwise things were kept about the same.  At this time I have evaluated him and find that the patient appears to be  at his baseline.  Despite some of his more dramatic statements I do not think he is at an elevated risk of hurting himself or anyone else above what is his baseline.  He has chronic suicidal ideation and has chronic risk factors for suicide some of which are not amenable to change and others of which have been addressed or he has resisted attempts to change.  Patient is often disruptive and makes treatment for other patients difficult on the unit.  Further hospitalization is unlikely to be beneficial.  Spent some time doing some encouragement some cognitive therapy and psychoeducation with the patient.  Prescriptions written particularly for those medicines which may have increased or changed in dosage.  Otherwise patient will be discharged today and will follow-up with [facility] health.  Physical Findings: AIMS: Facial and Oral Movements Muscles of Facial Expression: None, normal Lips and Perioral Area: None, normal Jaw: None, normal Tongue: None, normal,Extremity Movements Upper (arms, wrists, hands, fingers): None, normal Lower (legs, knees, ankles, toes): None, normal, Trunk Movements Neck, shoulders, hips: None, normal, Overall Severity Severity of abnormal movements (highest score from questions above): None, normal Incapacitation due  to abnormal movements: None, normal Patient's awareness of abnormal movements (rate only patient's report): No Awareness, Dental Status Current problems with teeth and/or dentures?: No Does patient usually wear dentures?: No  CIWA:  CIWA-Ar Total: 0 COWS:  COWS Total Score: 0  Musculoskeletal: Strength & Muscle Tone: within normal limits Gait & Station: normal Patient leans: N/A  Psychiatric Specialty Exam: Physical Exam  Nursing note and vitals reviewed. Constitutional: He appears well-developed and well-nourished.  HENT:  Head: Normocephalic and atraumatic.  Eyes: Pupils are equal, round, and reactive to light. Conjunctivae are normal.  Neck: Normal range of motion.  Cardiovascular: Regular rhythm and normal heart sounds.  Respiratory: Effort normal. No respiratory distress.  GI: Soft.  Musculoskeletal: Normal range of motion.  Neurological: He is alert.  Skin: Skin is warm and dry.  Psychiatric: His affect is blunt. His speech is delayed. He is slowed. Thought content is not paranoid. Cognition and memory are normal. He expresses impulsivity. He expresses no homicidal and no suicidal ideation.    Review of Systems  Constitutional: Negative.   HENT: Negative.   Eyes: Negative.   Respiratory: Negative.   Cardiovascular: Negative.   Gastrointestinal: Negative.   Musculoskeletal: Negative.   Skin: Negative.   Neurological: Negative.   Psychiatric/Behavioral: Positive for depression. Negative for hallucinations, memory loss, substance abuse and suicidal ideas. The patient is nervous/anxious and has insomnia.     Blood pressure 113/82, pulse 100, temperature 98.1 F (36.7 C), temperature source Oral, resp. rate 18, height 6\' 2"  (1.88 m), weight 96.6 kg, SpO2 99 %.Body mass index is 27.35 kg/m.  General Appearance: Casual  Eye Contact:  Good  Speech:  Clear and Coherent  Volume:  Decreased  Mood:  Dysphoric  Affect:  Congruent  Thought Process:  Coherent  Orientation:   Full (Time, Place, and Person)  Thought Content:  Logical and Rumination  Suicidal Thoughts:  No  Homicidal Thoughts:  No  Memory:  Immediate;   Fair Recent;   Fair Remote;   Fair  Judgement:  Impaired  Insight:  Shallow  Psychomotor Activity:  Decreased  Concentration:  Concentration: Fair  Recall:  [employer] of Knowledge:  Fair  Language:  Fair  Akathisia:  No  Handed:  Right  AIMS (if indicated):     Assets:  Communication Skills Desire for Improvement Housing  Resilience Social Support  ADL's:  Intact  Cognition:  Impaired,  Mild  Sleep:        Have you used any form of tobacco in the last 30 days? (Cigarettes, Smokeless Tobacco, Cigars, and/or Pipes): Yes  Has this patient used any form of tobacco in the last 30 days? (Cigarettes, Smokeless Tobacco, Cigars, and/or Pipes) Yes, Yes, A prescription for an FDA-approved tobacco cessation medication was offered at discharge and the patient refused  Blood Alcohol level:  Lab Results  Component Value Date   [facility] <10 03/30/2018   ETH <10 50/53/9767    Metabolic Disorder Labs:  Lab Results  Component Value Date   HGBA1C 6.9 (H) 03/31/2018   MPG 151.33 03/31/2018   MPG 126 03/13/2017   Lab Results  Component Value Date   PROLACTIN 18.1 (H) 09/12/2016   PROLACTIN 6.5 05/27/2016   Lab Results  Component Value Date   CHOL 305 (H) 03/31/2018   TRIG 679 (H) 03/31/2018   HDL 29 (L) 03/31/2018   CHOLHDL 10.5 03/31/2018   VLDL UNABLE TO CALCULATE IF TRIGLYCERIDE OVER 400 mg/dL 03/31/2018   LDLCALC UNABLE TO CALCULATE IF TRIGLYCERIDE OVER 400 mg/dL 03/31/2018   LDLCALC 22 03/13/2017    See Psychiatric Specialty Exam and Suicide Risk Assessment completed by Attending Physician prior to discharge.  Discharge destination:  Home  Is patient on multiple antipsychotic therapies at discharge:  No   Has Patient had three or more failed trials of antipsychotic monotherapy by history:  No  Recommended Plan for Multiple  Antipsychotic Therapies: NA  Discharge Instructions    Diet - low sodium heart healthy   Complete by:  As directed    Increase activity slowly   Complete by:  As directed      Allergies as of 04/01/2018      Reactions   Prednisone Other (See Comments)   Hypertension and makes him feel "spacey"   Hydrocodone Other (See Comments)   Causes headaches and makes the patient irritable   Trazodone And Nefazodone Other (See Comments)   Patient stated that this keeps him awake; "has an opposite effect on me"   Varenicline Other (See Comments)   Suicidal thoughts      Medication List    STOP taking these medications   ARIPiprazole 30 MG tablet Commonly known as:  ABILIFY   busPIRone 15 MG tablet Commonly known as:  BUSPAR   carbamazepine 200 MG tablet Commonly known as:  TEGRETOL   chlorproMAZINE 200 MG tablet Commonly known as:  THORAZINE   clonazePAM 0.5 MG tablet Commonly known as:  KLONOPIN   clonazePAM 1 MG tablet Commonly known as:  KLONOPIN   clopidogrel 75 MG tablet Commonly known as:  PLAVIX   divalproex 500 MG DR tablet Commonly known as:  DEPAKOTE   doxepin 100 MG capsule Commonly known as:  SINEQUAN   doxepin 50 MG capsule Commonly known as:  SINEQUAN   escitalopram 10 MG tablet Commonly known as:  LEXAPRO   lithium carbonate 450 MG CR tablet Commonly known as:  ESKALITH   lurasidone 80 MG Tabs tablet Commonly known as:  LATUDA   Melatonin 3 MG Caps   mirtazapine 45 MG tablet Commonly known as:  REMERON   mupirocin cream 2 % Commonly known as:  BACTROBAN   nicotine 21 mg/24hr patch Commonly known as:  NICODERM CQ - dosed in mg/24 hours   OLANZapine 20 MG tablet Commonly known as:  ZYPREXA   Oxcarbazepine 300 MG tablet  Commonly known as:  TRILEPTAL   traZODone 100 MG tablet Commonly known as:  DESYREL   TRINTELLIX 10 MG Tabs tablet Generic drug:  vortioxetine HBr     TAKE these medications     Indication  aspirin 81 MG EC  tablet Take 1 tablet (81 mg total) by mouth daily. For heart health  Indication:  Heart health   atorvastatin 80 MG tablet Commonly known as:  LIPITOR Take 1 tablet (80 mg total) by mouth daily at 6 PM. For high cholesterol What changed:  when to take this  Indication:  Inherited Heterozygous Hypercholesterolemia, High Amount of Fats in the Blood   carvedilol 6.25 MG tablet Commonly known as:  COREG Take 1 tablet (6.25 mg total) by mouth 2 (two) times daily with a meal. For high blood pressure  Indication:  High Blood Pressure of Unknown Cause   gabapentin 300 MG capsule Commonly known as:  NEURONTIN Take 3 capsules (900 mg total) by mouth 3 (three) times daily. What changed:    how much to take  additional instructions  Another medication with the same name was removed. Continue taking this medication, and follow the directions you see here.  Indication:  Social Anxiety Disorder   ibuprofen 800 MG tablet Commonly known as:  ADVIL,MOTRIN Take 2,400 mg by mouth 3 (three) times daily as needed.  Indication:  Inflammation   lamoTRIgine 100 MG tablet Commonly known as:  LAMICTAL Take 1 tablet (100 mg total) by mouth daily. Start taking on:  April 02, 2018 What changed:    medication strength  how much to take  when to take this  Indication:  Depressive Phase of Manic-Depression   metFORMIN 500 MG tablet Commonly known as:  GLUCOPHAGE Take 1 tablet (500 mg total) by mouth 2 (two) times daily with a meal. For diabetes management  Indication:  Type 2 Diabetes   nitroGLYCERIN 0.4 MG SL tablet Commonly known as:  NITROSTAT Place 1 tablet (0.4 mg total) under the tongue every 5 (five) minutes x 3 doses as needed. For chest pain  Indication:  Acute Angina Pectoris   QUEtiapine 300 MG tablet Commonly known as:  SEROQUEL Take 2 tablets (600 mg total) by mouth at bedtime. What changed:  Another medication with the same name was changed. Make sure you understand how and  when to take each.  Indication:  Major Depressive Disorder   QUEtiapine 200 MG tablet Commonly known as:  SEROQUEL Take 1 tablet (200 mg total) by mouth daily. Start taking on:  April 02, 2018 What changed:    how much to take  when to take this  Indication:  Major Depressive Disorder   sertraline 100 MG tablet Commonly known as:  ZOLOFT Take 100 mg by mouth at bedtime.  Indication:  Major Depressive Disorder        Follow-up recommendations:  Activity:  Activity as tolerated Diet:  Diabetic diet Other:  Follow-up with [facility]  Comments: Follow-up with outpatient providers.  Strongly praised and encouraged his efforts at remaining sober.  Continue current medicines but follow-up with further concerns at his outpatient appointment which is coming up on the 23rd.  Signed: Alethia Berthold, MD 04/01/2018, 12:36 PM

## 2018-04-01 NOTE — Progress Notes (Signed)
Recreation Therapy Notes  Date: 04/01/2017  Time: 9:30 am   Location: Craft room   Behavioral response: N/A   Intervention Topic: Anger Management  Discussion/Intervention: Patient did not attend group.   Clinical Observations/Feedback:  Patient did not attend group.   Zhane Donlan LRT/CTRS          Semir Brill 04/01/2018 11:34 AM

## 2018-04-01 NOTE — Progress Notes (Signed)
  [facility] Adult Case Management Discharge Plan :  Will you be returning to the same living situation after discharge:  Yes,  own home At discharge, do you have transportation home?: Yes,  sister Do you have the ability to pay for your medications: Yes,  [facility] Medicare  Release of information consent forms completed and in the chart;  Patient's signature needed at discharge.  Patient to Follow up at: Follow-up Information    P[facility], [employer]. Go on 04/10/2018.   Why:  Please attend your medication appt on Wednesday, 04/10/18, at 1230pm.  Please bring photo ID and your hospital discharge paperwork.  Contact information: [place] [phone]           Next level of care provider has access to [place] and Suicide Prevention discussed: No. Pt declined consent.   Have you used any form of tobacco in the last 30 days? (Cigarettes, Smokeless Tobacco, Cigars, and/or Pipes): Yes  Has patient been referred to the Quitline?: Patient refused referral  Patient has been referred for addiction treatment: N/A  Joanne Chars, [place] 04/01/2018, 2:02 PM

## 2018-04-01 NOTE — BHH Suicide Risk Assessment (Signed)
[facility] Discharge Suicide Risk Assessment   Principal Problem: Depression, major, recurrent, severe with psychosis ([place]) Discharge Diagnoses: Principal Problem:   Depression, major, recurrent, severe with psychosis ([place]) Active Problems:   Chronic Suicidal ideation   Generalized anxiety disorder   Uncontrolled type 2 diabetes mellitus without complication, without long-term current use of insulin (HCC)   Chronic pain syndrome   HTN (hypertension)   Chronic neck pain (Primary Area of Pain) (Left)   Personality disorder (HCC)   Total Time spent with patient: 45 minutes  Musculoskeletal: Strength & Muscle Tone: within normal limits Gait & Station: normal Patient leans: N/A  Psychiatric Specialty Exam: Review of Systems  Constitutional: Negative.   HENT: Negative.   Eyes: Negative.   Respiratory: Negative.   Cardiovascular: Negative.   Gastrointestinal: Negative.   Musculoskeletal: Positive for neck pain.  Skin: Negative.   Neurological: Negative.   Psychiatric/Behavioral: Positive for depression. Negative for hallucinations, memory loss, substance abuse and suicidal ideas. The patient is nervous/anxious and has insomnia.     Blood pressure 113/82, pulse 100, temperature 98.1 F (36.7 C), temperature source Oral, resp. rate 18, height 6\' 2"  (1.88 m), weight 96.6 kg, SpO2 99 %.Body mass index is 27.35 kg/m.  General Appearance: Casual  Eye Contact::  Fair  Speech:  Clear and [id number]  Volume:  Normal  Mood:  Dysphoric  Affect:  Congruent  Thought Process:  Coherent  Orientation:  Full (Time, Place, and Person)  Thought Content:  Logical and Rumination  Suicidal Thoughts:  No  Homicidal Thoughts:  No  Memory:  Immediate;   Fair Recent;   Fair Remote;   Fair  Judgement:  Impaired  Insight:  Shallow  Psychomotor Activity:  Decreased  Concentration:  Fair  Recall:  [employer] of Knowledge:Fair  Language: Fair  Akathisia:  No  Handed:  Right  AIMS (if indicated):      Assets:  Desire for Improvement Housing Resilience Social Support  Sleep:     Cognition: Impaired,  Mild  ADL's:  Intact   Mental Status Per Nursing Assessment::   On Admission:  Suicidal ideation indicated by patient(PASSIVE SUICIDAL THOUGHTS)  Demographic Factors:  Male, Divorced or widowed, Caucasian and Low socioeconomic status  Loss Factors: Decrease in vocational status, Decline in physical health and Financial problems/change in socioeconomic status  Historical Factors: Prior suicide attempts, Family history of mental illness or substance abuse and Impulsivity  Risk Reduction Factors:   Responsible for children under 67 years of age, Sense of responsibility to family, Religious beliefs about death, Living with another person, especially a relative, Positive social support and Positive therapeutic relationship  Continued Clinical Symptoms:  Depression:   Anhedonia  Cognitive Features That Contribute To Risk:  Closed-mindedness    Suicide Risk:  Mild:  Suicidal ideation of limited frequency, intensity, duration, and specificity.  There are no identifiable plans, no associated intent, mild dysphoria and related symptoms, good self-control (both objective and subjective assessment), few other risk factors, and identifiable protective factors, including available and accessible social support.    Plan Of Care/Follow-up recommendations:  Activity:  Activity as tolerated Diet:  Diabetic carb limited diet Other:  Follow-up with [facility] as recommended  Alethia Berthold, MD 04/01/2018, 12:28 PM

## 2018-04-01 NOTE — BHH Suicide Risk Assessment (Signed)
[place] INPATIENT:  Family/Significant Other Suicide Prevention Education  Suicide Prevention Education:  Patient Refusal for Family/Significant Other Suicide Prevention Education: The patient Michael Schmitt has refused to provide written consent for family/significant other to be provided Family/Significant Other Suicide Prevention Education during admission and/or prior to discharge.  Physician notified.  Joanne Chars, LCSW 04/01/2018, 12:59 PM

## 2018-04-01 NOTE — BHH Counselor (Signed)
PSA not completed for this pt due to his being on the unit for under 72 hours. Winferd Humphrey, MSW, LCSW Clinical Social Worker 04/01/2018 1:00 PM

## 2018-04-01 NOTE — Progress Notes (Signed)
D: Pt A & O X 4. Presents with blunted affect and labile mood. Denies SI, HI, AVH and pain at this time "not right now, I just need help".  D/C home as ordered. Picked up by his sister. A: D/C instructions reviewed with pt including prescriptions and follow up appointment; compliance encouraged. All belongings from assigned locker given to pt at time of departure. Scheduled and PRN medications given with verbal education and effects monitored. Safety checks maintained without incident till time of d/c.  R: Pt receptive to care. Compliant with medications when offered. Denies adverse drug reactions when assessed. Verbalized understanding related to d/c instructions. Signed belonging sheet in agreement with items received from locker. Ambulatory with a steady gait. Appears to be in no physical distress at time of departure.

## 2018-04-03 DIAGNOSIS — E1165 Type 2 diabetes mellitus with hyperglycemia: Secondary | ICD-10-CM | POA: Diagnosis not present

## 2018-04-03 DIAGNOSIS — E782 Mixed hyperlipidemia: Secondary | ICD-10-CM | POA: Diagnosis not present

## 2018-04-03 DIAGNOSIS — I10 Essential (primary) hypertension: Secondary | ICD-10-CM | POA: Diagnosis not present

## 2018-04-11 DIAGNOSIS — R062 Wheezing: Secondary | ICD-10-CM | POA: Diagnosis not present

## 2018-04-11 DIAGNOSIS — Z789 Other specified health status: Secondary | ICD-10-CM | POA: Diagnosis not present

## 2018-04-17 ENCOUNTER — Other Ambulatory Visit: Payer: Self-pay

## 2018-04-17 ENCOUNTER — Emergency Department
Admission: EM | Admit: 2018-04-17 | Discharge: 2018-04-17 | Disposition: A | Discharge status: Home / Self Care | Payer: Medicare Other | Attending: Emergency Medicine | Admitting: Emergency Medicine

## 2018-04-17 ENCOUNTER — Encounter (HOSPITAL_COMMUNITY): Payer: Self-pay | Admitting: *Deleted

## 2018-04-17 ENCOUNTER — Encounter: Payer: Self-pay | Admitting: Emergency Medicine

## 2018-04-17 ENCOUNTER — Emergency Department (HOSPITAL_COMMUNITY)
Admission: EM | Admit: 2018-04-17 | Discharge: 2018-04-18 | Disposition: A | Discharge status: Home / Self Care | Payer: Medicare Other | Attending: Emergency Medicine | Admitting: Emergency Medicine

## 2018-04-17 DIAGNOSIS — Z79899 Other long term (current) drug therapy: Secondary | ICD-10-CM | POA: Insufficient documentation

## 2018-04-17 DIAGNOSIS — I2510 Atherosclerotic heart disease of native coronary artery without angina pectoris: Secondary | ICD-10-CM | POA: Diagnosis not present

## 2018-04-17 DIAGNOSIS — F32A Depression, unspecified: Secondary | ICD-10-CM

## 2018-04-17 DIAGNOSIS — R440 Auditory hallucinations: Secondary | ICD-10-CM | POA: Diagnosis present

## 2018-04-17 DIAGNOSIS — I252 Old myocardial infarction: Secondary | ICD-10-CM | POA: Diagnosis not present

## 2018-04-17 DIAGNOSIS — Z765 Malingerer [conscious simulation]: Secondary | ICD-10-CM | POA: Diagnosis not present

## 2018-04-17 DIAGNOSIS — Z008 Encounter for other general examination: Secondary | ICD-10-CM | POA: Insufficient documentation

## 2018-04-17 DIAGNOSIS — R45851 Suicidal ideations: Secondary | ICD-10-CM | POA: Insufficient documentation

## 2018-04-17 DIAGNOSIS — F329 Major depressive disorder, single episode, unspecified: Secondary | ICD-10-CM

## 2018-04-17 DIAGNOSIS — I129 Hypertensive chronic kidney disease with stage 1 through stage 4 chronic kidney disease, or unspecified chronic kidney disease: Secondary | ICD-10-CM | POA: Insufficient documentation

## 2018-04-17 DIAGNOSIS — N189 Chronic kidney disease, unspecified: Secondary | ICD-10-CM | POA: Diagnosis not present

## 2018-04-17 DIAGNOSIS — Z7982 Long term (current) use of aspirin: Secondary | ICD-10-CM | POA: Insufficient documentation

## 2018-04-17 DIAGNOSIS — Z7984 Long term (current) use of oral hypoglycemic drugs: Secondary | ICD-10-CM | POA: Insufficient documentation

## 2018-04-17 DIAGNOSIS — E119 Type 2 diabetes mellitus without complications: Secondary | ICD-10-CM | POA: Insufficient documentation

## 2018-04-17 DIAGNOSIS — I10 Essential (primary) hypertension: Secondary | ICD-10-CM | POA: Diagnosis not present

## 2018-04-17 DIAGNOSIS — F17210 Nicotine dependence, cigarettes, uncomplicated: Secondary | ICD-10-CM | POA: Insufficient documentation

## 2018-04-17 DIAGNOSIS — F332 Major depressive disorder, recurrent severe without psychotic features: Secondary | ICD-10-CM | POA: Insufficient documentation

## 2018-04-17 LAB — URINE DRUG SCREEN, QUALITATIVE (ARMC ONLY)
Amphetamines, Ur Screen: NOT DETECTED
BARBITURATES, UR SCREEN: NOT DETECTED
Benzodiazepine, Ur Scrn: NOT DETECTED
Cannabinoid 50 Ng, Ur ~~LOC~~: NOT DETECTED
Cocaine Metabolite,Ur ~~LOC~~: NOT DETECTED
MDMA (Ecstasy)Ur Screen: NOT DETECTED
Methadone Scn, Ur: NOT DETECTED
OPIATE, UR SCREEN: NOT DETECTED
Phencyclidine (PCP) Ur S: NOT DETECTED
Tricyclic, Ur Screen: POSITIVE — AB

## 2018-04-17 LAB — COMPREHENSIVE METABOLIC PANEL
ALT: 39 U/L (ref 0–44)
AST: 33 U/L (ref 15–41)
Albumin: 4.4 g/dL (ref 3.5–5.0)
Alkaline Phosphatase: 156 U/L — ABNORMAL HIGH (ref 38–126)
Anion gap: 10 (ref 5–15)
BILIRUBIN TOTAL: 0.7 mg/dL (ref 0.3–1.2)
BUN: 9 mg/dL (ref 6–20)
CO2: 20 mmol/L — ABNORMAL LOW (ref 22–32)
CREATININE: 0.97 mg/dL (ref 0.61–1.24)
Calcium: 9.3 mg/dL (ref 8.9–10.3)
Chloride: 104 mmol/L (ref 98–111)
GFR calc Af Amer: >60 mL/min (ref >60)
GFR calc non Af Amer: >60 mL/min (ref >60)
Glucose, Bld: 194 mg/dL — ABNORMAL HIGH (ref 70–99)
Potassium: 3.9 mmol/L (ref 3.5–5.1)
Sodium: 134 mmol/L — ABNORMAL LOW (ref 135–145)
Total Protein: 7.9 g/dL (ref 6.5–8.1)

## 2018-04-17 LAB — CBC
HCT: 45.3 % (ref 39.0–52.0)
Hemoglobin: 15.4 g/dL (ref 13.0–17.0)
MCH: 31.2 pg (ref 26.0–34.0)
MCHC: 34 g/dL (ref 30.0–36.0)
MCV: 91.7 fL (ref 80.0–100.0)
Platelets: 290 10*3/uL (ref 150–400)
RBC: 4.94 MIL/uL (ref 4.22–5.81)
RDW: 15.3 % (ref 11.5–15.5)
WBC: 10.5 10*3/uL (ref 4.0–10.5)
nRBC: 0 % (ref 0.0–0.2)

## 2018-04-17 LAB — ETHANOL: Alcohol, Ethyl (B): <10 mg/dL (ref <10)

## 2018-04-17 LAB — ACETAMINOPHEN LEVEL: Acetaminophen (Tylenol), Serum: <10 ug/mL — ABNORMAL LOW (ref 10–30)

## 2018-04-17 LAB — SALICYLATE LEVEL: Salicylate Lvl: <7 mg/dL (ref 2.8–30.0)

## 2018-04-17 MED ORDER — SERTRALINE HCL 100 MG PO TABS
100.0000 mg | ORAL_TABLET | Freq: Every day | ORAL | Status: DC
  Administered 2018-04-17: 100 mg via ORAL
  Filled 2018-04-17: qty 1

## 2018-04-17 MED ORDER — LORAZEPAM 1 MG PO TABS
2.0000 mg | ORAL_TABLET | Freq: Once | ORAL | Status: AC
  Administered 2018-04-17: 2 mg via ORAL
  Filled 2018-04-17: qty 2

## 2018-04-17 MED ORDER — LIDOCAINE VISCOUS HCL 2 % MT SOLN: 15.0000 mL | Freq: Once | OROMUCOSAL | Status: DC

## 2018-04-17 MED ORDER — NICOTINE 21 MG/24HR TD PT24
21.0000 mg | MEDICATED_PATCH | Freq: Every day | TRANSDERMAL | Status: DC
  Administered 2018-04-17 – 2018-04-18 (×2): 21 mg via TRANSDERMAL
  Filled 2018-04-17 (×2): qty 1

## 2018-04-17 MED ORDER — ALBUTEROL SULFATE HFA 108 (90 BASE) MCG/ACT IN AERS
2.0000 | INHALATION_SPRAY | RESPIRATORY_TRACT | Status: DC | PRN
  Administered 2018-04-17 – 2018-04-18 (×2): 2 via RESPIRATORY_TRACT
  Filled 2018-04-17: qty 6.7

## 2018-04-17 MED ORDER — LAMOTRIGINE 100 MG PO TABS
100.0000 mg | ORAL_TABLET | Freq: Every day | ORAL | Status: DC
  Administered 2018-04-17 – 2018-04-18 (×2): 100 mg via ORAL
  Filled 2018-04-17 (×2): qty 1

## 2018-04-17 MED ORDER — GABAPENTIN 300 MG PO CAPS
900.0000 mg | ORAL_CAPSULE | Freq: Three times a day (TID) | ORAL | Status: DC
  Administered 2018-04-17 – 2018-04-18 (×3): 900 mg via ORAL
  Filled 2018-04-17 (×3): qty 3

## 2018-04-17 MED ORDER — CARVEDILOL 12.5 MG PO TABS
6.2500 mg | ORAL_TABLET | Freq: Two times a day (BID) | ORAL | Status: DC
  Administered 2018-04-17 – 2018-04-18 (×2): 6.25 mg via ORAL
  Filled 2018-04-17 (×2): qty 1

## 2018-04-17 MED ORDER — ALUM & MAG HYDROXIDE-SIMETH 200-200-20 MG/5ML PO SUSP: 30.0000 mL | Freq: Once | ORAL | Status: DC

## 2018-04-17 MED ORDER — METFORMIN HCL 500 MG PO TABS
500.0000 mg | ORAL_TABLET | Freq: Two times a day (BID) | ORAL | Status: DC
  Administered 2018-04-17 – 2018-04-18 (×2): 500 mg via ORAL
  Filled 2018-04-17 (×2): qty 1

## 2018-04-17 MED ORDER — ATORVASTATIN CALCIUM 80 MG PO TABS
80.0000 mg | ORAL_TABLET | Freq: Every day | ORAL | Status: DC
  Administered 2018-04-17 – 2018-04-18 (×2): 80 mg via ORAL
  Filled 2018-04-17 (×2): qty 1

## 2018-04-17 MED ORDER — ALUM & MAG HYDROXIDE-SIMETH 200-200-20 MG/5ML PO SUSP: 30.0000 mL | Freq: Four times a day (QID) | ORAL | Status: DC | PRN

## 2018-04-17 MED ORDER — QUETIAPINE FUMARATE 200 MG PO TABS
200.0000 mg | ORAL_TABLET | Freq: Every day | ORAL | Status: DC
  Administered 2018-04-18: 200 mg via ORAL
  Filled 2018-04-17: qty 1

## 2018-04-17 MED ORDER — ASPIRIN EC 81 MG PO TBEC
81.0000 mg | DELAYED_RELEASE_TABLET | Freq: Every day | ORAL | Status: DC
  Administered 2018-04-18: 81 mg via ORAL
  Filled 2018-04-17 (×2): qty 1

## 2018-04-17 MED ORDER — QUETIAPINE FUMARATE 200 MG PO TABS
600.0000 mg | ORAL_TABLET | Freq: Every day | ORAL | Status: DC
  Administered 2018-04-17: 600 mg via ORAL
  Filled 2018-04-17: qty 2

## 2018-04-17 MED ORDER — ACETAMINOPHEN 325 MG PO TABS: 650.0000 mg | ORAL_TABLET | ORAL | Status: DC | PRN

## 2018-04-17 NOTE — ED Provider Notes (Signed)
[facility] Emergency Department Provider Note    ____________________________________________   I have reviewed the triage vital signs and the nursing notes.   HISTORY  Chief Complaint Suicidal   History limited by: Not Limited   HPI Michael Schmitt is a 48 y.o. male who presents to the emergency department today because of concerns for suicidal thoughts.  Patient is well-known to the emergency department.  He appears to be at his baseline from when I have seen him previously.  Patient was evaluated by her psychiatrist earlier this month for similar presentation and behavior.    Per medical record review patient has a history of frequent ER visits for psychiatric issues.   Past Medical History:  Diagnosis Date  . Anxiety   . Anxiety   . Arthritis    cerv. stenosis, spondylosis, HNP- lower back , has been followed in pain clinic, has  had injection s in cerv. area  . Blood dyscrasia    told that when he was young he was a" free bleeder"  . CAD (coronary artery disease)   . Cervical spondylosis without myelopathy 07/24/2014  . Cervicogenic headache 07/24/2014  . Chronic kidney disease    renal calculi- passed spontaneously  . Depression   . Diabetes mellitus without complication ([place])   . Fatty liver   . GERD (gastroesophageal reflux disease)   . Headache(784.0)   . Hyperlipidemia   . Hypertension   . Mental disorder   . MI, old   . RLS (restless legs syndrome)    detected on sleep study  . Shortness of breath   . Ventricular fibrillation ([place]) 04/05/2016    Patient Active Problem List   Diagnosis Date Noted  . Depression, major, recurrent, severe with psychosis ([place]) 03/30/2018  . Personality disorder ([place]) 12/31/2017  . MDD (major depressive disorder), recurrent episode, severe ([place]) 11/20/2017  . Dysthymia 10/26/2017  . Severe recurrent major depression without psychotic features ([place]) 03/12/2017  . Generalized anxiety disorder  02/05/2017  . Chronic neck pain (Primary Area of Pain) (Left) 11/22/2016  . Cervical facet syndrome (Left) 11/22/2016  . Cervical radiculitis (Left) 11/22/2016  . History of cervical spinal surgery 11/22/2016  . Cervical spondylosis 11/22/2016  . Chronic shoulder pain (Secondary Area of Pain) (Left) 11/22/2016  . Arthropathy of shoulder (Left) 11/22/2016  . Chronic low back pain [facility] Area of Pain) (Bilateral) (L>R) 11/22/2016  . Lumbar facet syndrome (Bilateral) (L>R) 11/22/2016  . Chronic pain syndrome 11/21/2016  . Long term prescription benzodiazepine use 11/21/2016  . Opiate use 11/21/2016  . Uncontrolled type 2 diabetes mellitus without complication, without long-term current use of insulin ([place]) 10/19/2016  . Chronic Suicidal ideation 09/15/2016  . Coronary artery disease 09/12/2016  . HTN (hypertension) 06/26/2016  . GERD (gastroesophageal reflux disease) 06/26/2016  . Alcohol use disorder, severe, 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[place]) 06/26/2016  . Benzodiazepine abuse ([place]) 06/11/2016  . Sedative, hypnotic or anxiolytic use disorder, severe, dependence ([place]) 05/12/2016  . Ventricular fibrillation ([place]) 04/05/2016  . Combined hyperlipidemia 01/05/2016  . Cervical spondylosis without myelopathy 07/24/2014  . Severe episode of recurrent major depressive disorder, without psychotic features ([place]) 04/15/2013  . Tobacco use disorder 10/20/2010  . Asthma 10/20/2010    Past Surgical History:  Procedure Laterality Date  . ANTERIOR CERVICAL DECOMP/DISCECTOMY FUSION  11/13/2011   Procedure: ANTERIOR CERVICAL DECOMPRESSION/DISCECTOMY FUSION 1 LEVEL;  Surgeon: [name], MD;  Location: [place] [facility]UR[facility] ORS;  Service: Neurosurgery;  Laterality: N/A;  Anterior Cervical Decompression/discectomy Fusion. Cervical  three-four.  Marland Kitchen CARDIAC CATHETERIZATION N/A 01/01/2015   Procedure: Left Heart Cath and Coronary Angiography;  Surgeon: [name], MD;  Location: [place] CV LAB;  Service: Cardiovascular;   Laterality: N/A;  . CARDIAC CATHETERIZATION N/A 04/05/2016   Procedure: Left Heart Cath and Coronary Angiography;  Surgeon: Belva Crome, MD;  Location: [place] CV LAB;  Service: Cardiovascular;  Laterality: N/A;  . CARDIAC CATHETERIZATION N/A 04/05/2016   Procedure: Coronary Stent Intervention;  Surgeon: Belva Crome, MD;  Location: [place] CV LAB;  Service: Cardiovascular;  Laterality: N/A;  . CARDIAC CATHETERIZATION N/A 04/05/2016   Procedure: Intravascular Ultrasound/IVUS;  Surgeon: Belva Crome, MD;  Location: [place] CV LAB;  Service: Cardiovascular;  Laterality: N/A;  . NASAL SINUS SURGERY     2005    Prior to Admission medications   Medication Sig Start Date End Date Taking? Authorizing Provider  aspirin 81 MG EC tablet Take 1 tablet (81 mg total) by mouth daily. For heart health 11/21/17   Lindell Spar I, NP  atorvastatin (LIPITOR) 80 MG tablet Take 1 tablet (80 mg total) by mouth daily at 6 PM. For high cholesterol Patient taking differently: Take 80 mg by mouth daily. For high cholesterol 11/21/17   Lindell Spar I, NP  carvedilol (COREG) 6.25 MG tablet Take 1 tablet (6.25 mg total) by mouth 2 (two) times daily with a meal. For high blood pressure 11/21/17   Nwoko, Herbert Pun I, NP  gabapentin (NEURONTIN) 300 MG capsule Take 3 capsules (900 mg total) by mouth 3 (three) times daily. 04/01/18   Clapacs, Madie Reno, MD  ibuprofen (ADVIL,MOTRIN) 800 MG tablet Take 2,400 mg by mouth 3 (three) times daily as needed.    [provider]  lamoTRIgine (LAMICTAL) 100 MG tablet Take 1 tablet (100 mg total) by mouth daily. 04/02/18   Clapacs, Madie Reno, MD  metFORMIN (GLUCOPHAGE) 500 MG tablet Take 1 tablet (500 mg total) by mouth 2 (two) times daily with a meal. For diabetes management 11/21/17   Lindell Spar I, NP  nitroGLYCERIN (NITROSTAT) 0.4 MG SL tablet Place 1 tablet (0.4 mg total) under the tongue every 5 (five) minutes x 3 doses as needed. For chest pain Patient not taking: Reported on  01/04/2018 11/21/17 11/21/18  Lindell Spar I, NP  QUEtiapine (SEROQUEL) 200 MG tablet Take 1 tablet (200 mg total) by mouth daily. 04/02/18   Clapacs, Madie Reno, MD  QUEtiapine (SEROQUEL) 300 MG tablet Take 2 tablets (600 mg total) by mouth at bedtime. 04/01/18   Clapacs, Madie Reno, MD  sertraline (ZOLOFT) 100 MG tablet Take 100 mg by mouth at bedtime.    [provider]    Allergies Prednisone; Hydrocodone; Trazodone and nefazodone; and Varenicline  Family History  Problem Relation Age of Onset  . Prostate cancer Father   . Hypertension Mother   . Kidney Stones Mother   . Anxiety disorder Mother   . Depression Mother   . COPD Sister   . Hypertension Sister   . Diabetes Sister   . Depression Sister   . Anxiety disorder Sister   . Seizures Sister   . ADD / ADHD Son   . ADD / ADHD Daughter     Social History Social History   Tobacco Use  . Smoking status: Current Every Day Smoker    Packs/day: 2.00    Years: 17.00    Pack years: 34.00    Types: Cigarettes, Cigars  . Smokeless tobacco: Former Systems developer  .  Tobacco comment: occassional snuff   Substance Use Topics  . Alcohol use: No    Alcohol/week: 0.0 standard drinks    Comment: last drinked in 3 months.   . Drug use: No    Review of Systems Constitutional: No fever/chills Eyes: No visual changes. ENT: No sore throat. Cardiovascular: Denies chest pain. Respiratory: Denies shortness of breath. Gastrointestinal: No abdominal pain.  No nausea, no vomiting.  No diarrhea.   Genitourinary: Negative for dysuria. Musculoskeletal: Negative for back pain.  Skin: Negative for rash. Neurological: Negative for headaches, focal weakness or numbness.  ____________________________________________   PHYSICAL EXAM:  VITAL SIGNS: ED Triage Vitals  Enc Vitals Group     BP 04/17/18 0750 (!) 154/104     Pulse Rate 04/17/18 0750 (!) 118     Resp 04/17/18 0750 20     Temp 04/17/18 0748 98.2 F (36.8 C)     Temp Source 04/17/18 0748  Oral     SpO2 04/17/18 0750 94 %     Weight 04/17/18 0748 213 lb (96.6 kg)     Height 04/17/18 0748 6\' 2"  (1.88 m)   Constitutional: Upset Eyes: Conjunctivae are normal.  ENT      Head: Normocephalic and atraumatic.      Mouth/Throat: Mucous membranes are moist.      Neck: No stridor. Respiratory: Normal respiratory effort without tachypnea nor retractions. Gastrointestinal: Soft and non tender. No rebound. No guarding.  Genitourinary: Deferred Musculoskeletal: Normal range of motion in all extremities.  Neurologic:  Normal speech and language. No gross focal neurologic deficits are appreciated.  Skin:  Skin is warm, dry and intact. No rash noted. Psychiatric: Occasional yelling, slapping himself in the head  ____________________________________________    LABS (pertinent positives/negatives)  Acetaminophen, ethanol, salicylate below threshold CBC wbc 10.5, hgb 15.4, plt 290 CMP na 134, k 3.9, glu 194, cr 0.97  ____________________________________________   EKG  None  ____________________________________________    RADIOLOGY  None  ____________________________________________   PROCEDURES  Procedures  ____________________________________________   INITIAL IMPRESSION / ASSESSMENT AND PLAN / ED COURSE  Pertinent labs & imaging results that were available during my care of the patient were reviewed by me and considered in my medical decision making (see chart for details).   Patient presented to the emergency department.  Patient appears to be at his baseline.  Patient exhibiting behavior similar to what he was exhibiting when psychiatry last saw him and when I have seen him previously.  At this point do think this is a chronic issue.  Did not appear that there is any acute change in the patient's behavior or mentation.  He does have appointment for therapy tomorrow already scheduled.  I discussed with patient importance of following up with this  appointment.  ____________________________________________   FINAL CLINICAL IMPRESSION(S) / ED DIAGNOSES  Final diagnoses:  Depression, unspecified depression type     Note: This dictation was prepared with Dragon dictation. Any transcriptional errors that result from this process are unintentional     Nance Pear, MD 04/17/18 [phone]

## 2018-04-17 NOTE — ED Triage Notes (Signed)
Pt in via [place] [employer] with c/o "wanting to cut his wrists".

## 2018-04-17 NOTE — ED Notes (Signed)
Called pharm to verify meds

## 2018-04-17 NOTE — ED Notes (Signed)
The patient was dressed out into the required purple scrubs. His belongings were placed into a white patient belongings bag and labeled properly. One blue jacket, pair blue jeans, socks, orange stripped shirt, underwear. Patient was very anger during the dress out period and complaining that he never gets any help from anyone. [employer] officer had to placed handcuffs on the patient to get him to calm down. After dress out period he was then walked back to quad room 20 for evaluation. No issues.

## 2018-04-17 NOTE — ED Notes (Signed)
Pt discharged to lobby (sister here to pick up patient). Belongings returned to patient. Discharge paperwork given to patient.  Pt refused VS. Pt refused to sign for discharge.

## 2018-04-17 NOTE — ED Triage Notes (Signed)
Pt in c/o suicidal ideation, aggressive and yelling in lobby

## 2018-04-17 NOTE — BH Assessment (Addendum)
Assessment Note  Michael Schmitt is an 48 y.o. male.  The pt came in after being discharged from [facility] ED.  The pt stated he is suicidal with a plan to cut his wrist.  The pt stated he last cut his wrist "a couple of weeks ago".  He stated he will kill himself if he is sent home.  This morning the pt was punching the walls in his home.  He stated he was upset because his sister would not bring him to the hospital.  The pt requested Ativan and stated that is the only medication that makes him happy.  He has an appointment with [facility] 1/23.  He was discharged from [facility] inpatient  03/31/2018.  The pt lives with his children, mother, and sister.  He reports getting along with everyone at home.  The pt is on disability due to his mental illness.  The pt denies HI.  He hits himself with upset.  The pt denies a history of abuse and hallucinations.  He sleeps about 2-3 hours a day and has a good appetite.  He denies SA and his UDS is negative for all substances.  Pt is dressed in scrubs. He is alert and oriented x4. Pt speaks in a clear tone, at moderate volume and normal pace. Eye contact is good. Pt's mood is irritated. Thought process is coherent and relevant. There is no indication Pt is currently responding to internal stimuli or experiencing delusional thought content.  The pt has tics.?Pt was cooperative throughout assessment.    Diagnosis: F33.2 Major depressive disorder, Recurrent episode, Severe   Past Medical History:  Past Medical History:  Diagnosis Date  . Anxiety   . Anxiety   . Arthritis    cerv. stenosis, spondylosis, HNP- lower back , has been followed in pain clinic, has  had injection s in cerv. area  . Blood dyscrasia    told that when he was young he was a" free bleeder"  . CAD (coronary artery disease)   . Cervical spondylosis without myelopathy 07/24/2014  . Cervicogenic headache 07/24/2014  . Chronic kidney disease    renal calculi- passed spontaneously  . Depression   .  Diabetes mellitus without complication ([place])   . Fatty liver   . GERD (gastroesophageal reflux disease)   . Headache(784.0)   . Hyperlipidemia   . Hypertension   . Mental disorder   . MI, old   . RLS (restless legs syndrome)    detected on sleep study  . Shortness of breath   . Ventricular fibrillation ([place]) 04/05/2016    Past Surgical History:  Procedure Laterality Date  . ANTERIOR CERVICAL DECOMP/DISCECTOMY FUSION  11/13/2011   Procedure: ANTERIOR CERVICAL DECOMPRESSION/DISCECTOMY FUSION 1 LEVEL;  Surgeon: [name], MD;  Location: [place] [facility]UR[facility] ORS;  Service: Neurosurgery;  Laterality: N/A;  Anterior Cervical Decompression/discectomy Fusion. Cervical three-four.  Marland Kitchen CARDIAC CATHETERIZATION N/A 01/01/2015   Procedure: Left Heart Cath and Coronary Angiography;  Surgeon: [name], MD;  Location: [place] CV LAB;  Service: Cardiovascular;  Laterality: N/A;  . CARDIAC CATHETERIZATION N/A 04/05/2016   Procedure: Left Heart Cath and Coronary Angiography;  Surgeon: Belva Crome, MD;  Location: [place] CV LAB;  Service: Cardiovascular;  Laterality: N/A;  . CARDIAC CATHETERIZATION N/A 04/05/2016   Procedure: Coronary Stent Intervention;  Surgeon: Belva Crome, MD;  Location: [place] CV LAB;  Service: Cardiovascular;  Laterality: N/A;  . CARDIAC CATHETERIZATION N/A 04/05/2016   Procedure: Intravascular Ultrasound/IVUS;  Surgeon:  Belva Crome, MD;  Location: [place] CV LAB;  Service: Cardiovascular;  Laterality: N/A;  . NASAL SINUS SURGERY     2005    Family History:  Family History  Problem Relation Age of Onset  . Prostate cancer Father   . Hypertension Mother   . Kidney Stones Mother   . Anxiety disorder Mother   . Depression Mother   . COPD Sister   . Hypertension Sister   . Diabetes Sister   . Depression Sister   . Anxiety disorder Sister   . Seizures Sister   . ADD / ADHD Son   . ADD / ADHD Daughter     Social History:  reports that he has been smoking  cigarettes and cigars. He has a 34.00 pack-year smoking history. He has quit using smokeless tobacco. He reports that he does not drink alcohol or use drugs.  Additional Social History:  Alcohol / Drug Use Pain Medications: See MAR Prescriptions: See MAR Over the Counter: See MAR History of alcohol / drug use?: No history of alcohol / drug abuse Longest period of sobriety (when/how long): NA  CIWA:   COWS:    Allergies:  Allergies  Allergen Reactions  . Prednisone Other (See Comments)    Hypertension and makes him feel "spacey"  . Hydrocodone Other (See Comments)    Causes headaches and makes the patient irritable  . Trazodone And Nefazodone Other (See Comments)    Patient stated that this keeps him awake; "has an opposite effect on me"  . Varenicline Other (See Comments)    Suicidal thoughts    Home Medications: (Not in a hospital admission)   OB/GYN Status:  No LMP for male patient.  General Assessment Data Location of Assessment: [facility] ED TTS Assessment: In system Is this a Tele or Face-to-Face Assessment?: Face-to-Face Is this an Initial Assessment or a Re-assessment for this encounter?: Initial Assessment Patient Accompanied by:: N/A Language Other than English: No Living Arrangements: Other (Comment)(home) What gender do you identify as?: Male Marital status: Widowed Living Arrangements: Other relatives, Children Can pt return to current living arrangement?: Yes Admission Status: Voluntary Is patient capable of signing voluntary admission?: Yes Referral Source: Self/Family/Friend Insurance type: medicare     Crisis Care Plan Living Arrangements: Other relatives, Children Legal Guardian: Other:(self) Name of Psychiatrist: Reports of none Name of Therapist: Malcolm  Education Status Is patient currently in school?: No Is the patient employed, unemployed or receiving disability?: Receiving disability income(for mental illness)  Risk to self with the past 6  months Suicidal Ideation: Yes-Currently Present Has patient been a risk to self within the past 6 months prior to admission? : Yes Suicidal Intent: Yes-Currently Present Has patient had any suicidal intent within the past 6 months prior to admission? : Yes Is patient at risk for suicide?: Yes Suicidal Plan?: Yes-Currently Present Has patient had any suicidal plan within the past 6 months prior to admission? : Yes Specify Current Suicidal Plan: cut wrist or OD on meds Access to Means: Yes Specify Access to Suicidal Means: can get a knife and meds What has been your use of drugs/alcohol within the last 12 months?: none Previous Attempts/Gestures: Yes How many times?: 1 Other Self Harm Risks: pt denies Triggers for Past Attempts: None known Intentional Self Injurious Behavior: None Family Suicide History: No Recent stressful life event(s): Other (Comment)(pt denies any major stressors) Persecutory voices/beliefs?: No Depression: Yes Depression Symptoms: Insomnia, Despondent Substance abuse history and/or treatment for substance abuse?: No  Suicide prevention information given to non-admitted patients: Yes  Risk to Others within the past 6 months Homicidal Ideation: No Does patient have any lifetime risk of violence toward others beyond the six months prior to admission? : No Thoughts of Harm to Others: No Current Homicidal Intent: No Current Homicidal Plan: No Access to Homicidal Means: No Identified Victim: Pt deneis History of harm to others?: No Assessment of Violence: On admission Violent Behavior Description: pt was punchign walls and doors earlier today Does patient have access to weapons?: No Criminal Charges Pending?: No Does patient have a court date: No Is patient on probation?: No  Psychosis Hallucinations: None noted Delusions: None noted  Mental Status Report Appearance/Hygiene: In scrubs, Unremarkable Eye Contact: Good Motor Activity: Freedom of movement,  Tics Speech: Logical/coherent Level of Consciousness: Alert Mood: Depressed Affect: Appropriate to circumstance Anxiety Level: None Thought Processes: Coherent, Relevant Judgement: Impaired Orientation: Person, Time, Place, Situation Obsessive Compulsive Thoughts/Behaviors: None  Cognitive Functioning Concentration: Normal Memory: Recent Intact, Remote Intact Is patient IDD: No Insight: Poor Impulse Control: Poor Appetite: Good Have you had any weight changes? : No Change Sleep: Decreased Total Hours of Sleep: 5 Vegetative Symptoms: None  ADLScreening [facility] Assessment Services) Patient's cognitive ability adequate to safely complete daily activities?: Yes Patient able to express need for assistance with ADLs?: No Independently performs ADLs?: Yes (appropriate for developmental age)  Prior Inpatient Therapy Prior Inpatient Therapy: Yes Prior Therapy Dates: Multiple Hospitalizations, most recent 03/2018 Prior Therapy Facilty/Provider(s): [facility] Reason for Treatment: Depression  Prior Outpatient Therapy Prior Outpatient Therapy: Yes Prior Therapy Dates: current Prior Therapy Facilty/Provider(s): [place] Reason for Treatment: depression Does patient have an ACCT team?: No Does patient have Intensive In-House Services?  : No Does patient have [facility] services? : No Does patient have P4CC services?: No  ADL Screening (condition at time of admission) Patient's cognitive ability adequate to safely complete daily activities?: Yes Patient able to express need for assistance with ADLs?: No Independently performs ADLs?: Yes (appropriate for developmental age)       Abuse/Neglect Assessment (Assessment to be complete while patient is alone) Abuse/Neglect Assessment Can Be Completed: Yes Physical Abuse: Denies Verbal Abuse: Denies Sexual Abuse: Denies Exploitation of patient/patient's resources: Denies Self-Neglect: Denies Values / Beliefs Cultural Requests  During Hospitalization: None Spiritual Requests During Hospitalization: None Consults Spiritual Care Consult Needed: No Social Work Consult Needed: No            Disposition:  Disposition Initial Assessment Completed for this Encounter: Yes  NP Jinny Blossom recommends the pt be observed overnight for safety and stabilization.  The pt is to be reassessed by psychiatry in the morning.  MD Ellender Hose was made aware of the recommendations.   On Site Evaluation by:   Reviewed with Physician:    Enzo Montgomery 04/17/2018 2:30 PM

## 2018-04-17 NOTE — ED Notes (Signed)
Pt dressing for discharge. Pt only given clothing.   Maintained on 15 minute checks and observation by security  for safety.

## 2018-04-17 NOTE — ED Notes (Signed)
Pt called sister for a ride. Sister talked with this tech to make sure pt was being discharged and ask me why because they can't do anything with him. I ask pt sister how long before she could come and she said she had things to do and it would be at least 30 mins. Pt informed. Pt given sprite and continues to hollar out "help".  This tech encouraged pt to follow up with his doctor.

## 2018-04-17 NOTE — ED Notes (Signed)
In person TTS at bedside. Calling charge to find placement since pt is in progression bed.

## 2018-04-17 NOTE — ED Notes (Signed)
Called pharmacy again to get pt meds verified.

## 2018-04-17 NOTE — ED Notes (Signed)
Pt yelling and hitting himself in the head while EDP attempting to asses patient.    Pt to be discharged from ER.   Maintained on 15 minute checks and observation by security for safety.

## 2018-04-17 NOTE — ED Provider Notes (Signed)
[facility] EMERGENCY DEPARTMENT Provider Note   CSN: [id number] Arrival date & time: 04/17/18  1043     History   Chief Complaint Chief Complaint  Patient presents with  . Suicidal    HPI Michael Schmitt is a 48 y.o. male.  HPI   48 yo M with h/o CAD, CKD, HTN, DM, depression w/ psychosis here with suicidal ideation. Pt was just seen at [facility]. Per his and sister's report, he's had increasing extreme agitation, aggression, and now auditory hallucinations at home for the last week. He became very angry this AM and was damaging his house, so PD was called and he was taken to [place]. Labs unremarkable at [facility] and he was d/c. Pt's sister came to pick him up and he was still actively endorsing severe depression, SI with plan to cut his wrists, and depression. She brought him here for evaluation. He endorses active plan to slit his wrist or OD on medications. He denies any actual attempt. He has h/o similar sx and prior admissions at [facility].  Past Medical History:  Diagnosis Date  . Anxiety   . Anxiety   . Arthritis    cerv. stenosis, spondylosis, HNP- lower back , has been followed in pain clinic, has  had injection s in cerv. area  . Blood dyscrasia    told that when he was young he was a" free bleeder"  . CAD (coronary artery disease)   . Cervical spondylosis without myelopathy 07/24/2014  . Cervicogenic headache 07/24/2014  . Chronic kidney disease    renal calculi- passed spontaneously  . Depression   . Diabetes mellitus without complication ([place])   . Fatty liver   . GERD (gastroesophageal reflux disease)   . Headache(784.0)   . Hyperlipidemia   . Hypertension   . Mental disorder   . MI, old   . RLS (restless legs syndrome)    detected on sleep study  . Shortness of breath   . Ventricular fibrillation ([place]) 04/05/2016    Patient Active Problem List   Diagnosis Date Noted  . Depression, major, recurrent, severe with psychosis ([place])  03/30/2018  . Personality disorder ([place]) 12/31/2017  . MDD (major depressive disorder), recurrent episode, severe ([place]) 11/20/2017  . Dysthymia 10/26/2017  . Severe recurrent major depression without psychotic features ([place]) 03/12/2017  . Generalized anxiety disorder 02/05/2017  . Chronic neck pain (Primary Area of Pain) (Left) 11/22/2016  . Cervical facet syndrome (Left) 11/22/2016  . Cervical radiculitis (Left) 11/22/2016  . History of cervical spinal surgery 11/22/2016  . Cervical spondylosis 11/22/2016  . Chronic shoulder pain (Secondary Area of Pain) (Left) 11/22/2016  . Arthropathy of shoulder (Left) 11/22/2016  . Chronic low back pain [facility] Area of Pain) (Bilateral) (L>R) 11/22/2016  . Lumbar facet syndrome (Bilateral) (L>R) 11/22/2016  . Chronic pain syndrome 11/21/2016  . Long term prescription benzodiazepine use 11/21/2016  . Opiate use 11/21/2016  . Uncontrolled type 2 diabetes mellitus without complication, without long-term current use of insulin ([place]) 10/19/2016  . Chronic Suicidal ideation 09/15/2016  . Coronary artery disease 09/12/2016  . HTN (hypertension) 06/26/2016  . GERD (gastroesophageal reflux disease) 06/26/2016  . Alcohol use disorder, severe, 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[place]) 06/26/2016  . Benzodiazepine abuse ([place]) 06/11/2016  . Sedative, hypnotic or anxiolytic use disorder, severe, dependence ([place]) 05/12/2016  . Ventricular fibrillation ([place]) 04/05/2016  . Combined hyperlipidemia 01/05/2016  . Cervical spondylosis without myelopathy 07/24/2014  . Severe episode of recurrent major depressive disorder, without psychotic features (  [place]) 04/15/2013  . Tobacco use disorder 10/20/2010  . Asthma 10/20/2010    Past Surgical History:  Procedure Laterality Date  . ANTERIOR CERVICAL DECOMP/DISCECTOMY FUSION  11/13/2011   Procedure: ANTERIOR CERVICAL DECOMPRESSION/DISCECTOMY FUSION 1 LEVEL;  Surgeon: [name], MD;  Location: [place] NEURO ORS;  Service: Neurosurgery;   Laterality: N/A;  Anterior Cervical Decompression/discectomy Fusion. Cervical three-four.  Marland Kitchen CARDIAC CATHETERIZATION N/A 01/01/2015   Procedure: Left Heart Cath and Coronary Angiography;  Surgeon: [name], MD;  Location: [facility] CV LAB;  Service: Cardiovascular;  Laterality: N/A;  . CARDIAC CATHETERIZATION N/A 04/05/2016   Procedure: Left Heart Cath and Coronary Angiography;  Surgeon: Belva Crome, MD;  Location: [place] [facility] CV LAB;  Service: Cardiovascular;  Laterality: N/A;  . CARDIAC CATHETERIZATION N/A 04/05/2016   Procedure: Coronary Stent Intervention;  Surgeon: Belva Crome, MD;  Location: [facility] CV LAB;  Service: Cardiovascular;  Laterality: N/A;  . CARDIAC CATHETERIZATION N/A 04/05/2016   Procedure: Intravascular Ultrasound/IVUS;  Surgeon: Belva Crome, MD;  Location: [facility];  Service: Cardiovascular;  Laterality: N/A;  . NASAL SINUS SURGERY     2005        Home Medications    Prior to Admission medications   Medication Sig Start Date End Date Taking? Authorizing Provider  aspirin 81 MG EC tablet Take 1 tablet (81 mg total) by mouth daily. For heart health 11/21/17   Lindell Spar I, NP  atorvastatin (LIPITOR) 80 MG tablet Take 1 tablet (80 mg total) by mouth daily at 6 PM. For high cholesterol Patient taking differently: Take 80 mg by mouth daily. For high cholesterol 11/21/17   Lindell Spar I, NP  carvedilol (COREG) 6.25 MG tablet Take 1 tablet (6.25 mg total) by mouth 2 (two) times daily with a meal. For high blood pressure 11/21/17   Nwoko, Herbert Pun I, NP  gabapentin (NEURONTIN) 300 MG capsule Take 3 capsules (900 mg total) by mouth 3 (three) times daily. 04/01/18   Clapacs, Madie Reno, MD  ibuprofen (ADVIL,MOTRIN) 800 MG tablet Take 2,400 mg by mouth 3 (three) times daily as needed.    [provider]  lamoTRIgine (LAMICTAL) 100 MG tablet Take 1 tablet (100 mg total) by mouth daily. 04/02/18   Clapacs, Madie Reno, MD  metFORMIN (GLUCOPHAGE) 500 MG tablet Take  1 tablet (500 mg total) by mouth 2 (two) times daily with a meal. For diabetes management 11/21/17   Lindell Spar I, NP  nitroGLYCERIN (NITROSTAT) 0.4 MG SL tablet Place 1 tablet (0.4 mg total) under the tongue every 5 (five) minutes x 3 doses as needed. For chest pain Patient not taking: Reported on 01/04/2018 11/21/17 11/21/18  Lindell Spar I, NP  QUEtiapine (SEROQUEL) 200 MG tablet Take 1 tablet (200 mg total) by mouth daily. 04/02/18   Clapacs, Madie Reno, MD  QUEtiapine (SEROQUEL) 300 MG tablet Take 2 tablets (600 mg total) by mouth at bedtime. 04/01/18   Clapacs, Madie Reno, MD  sertraline (ZOLOFT) 100 MG tablet Take 100 mg by mouth at bedtime.    [provider]    Family History Family History  Problem Relation Age of Onset  . Prostate cancer Father   . Hypertension Mother   . Kidney Stones Mother   . Anxiety disorder Mother   . Depression Mother   . COPD Sister   . Hypertension Sister   . Diabetes Sister   . Depression Sister   . Anxiety disorder Sister   . Seizures Sister   .  ADD / ADHD Son   . ADD / ADHD Daughter     Social History Social History   Tobacco Use  . Smoking status: Current Every Day Smoker    Packs/day: 2.00    Years: 17.00    Pack years: 34.00    Types: Cigarettes, Cigars  . Smokeless tobacco: Former Systems developer  . Tobacco comment: occassional snuff   Substance Use Topics  . Alcohol use: No    Alcohol/week: 0.0 standard drinks    Comment: last drinked in 3 months.   . Drug use: No     Allergies   Prednisone; Hydrocodone; Trazodone and nefazodone; and Varenicline   Review of Systems Review of Systems  Constitutional: Negative for chills, fatigue and fever.  HENT: Negative for congestion and rhinorrhea.   Eyes: Negative for visual disturbance.  Respiratory: Positive for wheezing. Negative for shortness of breath.   Cardiovascular: Negative for chest pain and leg swelling.  Gastrointestinal: Negative for abdominal pain, diarrhea, nausea and vomiting.    Genitourinary: Negative for dysuria and flank pain.  Musculoskeletal: Negative for neck pain and neck stiffness.  Skin: Negative for rash and wound.  Allergic/Immunologic: Negative for immunocompromised state.  Neurological: Negative for syncope, weakness and headaches.  Psychiatric/Behavioral: Positive for agitation, behavioral problems, self-injury and suicidal ideas.  All other systems reviewed and are negative.    Physical Exam Updated Vital Signs There were no vitals taken for this visit.  Physical Exam Vitals signs and nursing note reviewed.  Constitutional:      General: He is not in acute distress.    Appearance: He is well-developed.  HENT:     Head: Normocephalic and atraumatic.  Eyes:     Conjunctiva/sclera: Conjunctivae normal.  Neck:     Musculoskeletal: Neck supple.  Cardiovascular:     Rate and Rhythm: Normal rate and regular rhythm.     Heart sounds: Normal heart sounds. No murmur. No friction rub.  Pulmonary:     Effort: Pulmonary effort is normal. No respiratory distress.     Breath sounds: Normal breath sounds. No wheezing or rales.  Abdominal:     General: There is no distension.     Palpations: Abdomen is soft.     Tenderness: There is no abdominal tenderness.  Skin:    General: Skin is warm.     Capillary Refill: Capillary refill takes less than 2 seconds.  Neurological:     Mental Status: He is alert and oriented to person, place, and time.     Motor: No abnormal muscle tone.  Psychiatric:        Attention and Perception: He perceives auditory hallucinations.        Mood and Affect: Affect is labile and angry.        Behavior: Behavior is agitated and aggressive.        Thought Content: Thought content is paranoid. Thought content includes suicidal ideation. Thought content includes suicidal plan.        Judgment: Judgment is impulsive and inappropriate.      ED Treatments / Results  Labs (all labs ordered are listed, but only abnormal  results are displayed) Labs Reviewed - No data to display  EKG None  Radiology No results found.  Procedures Procedures (including critical care time)  Medications Ordered in ED Medications  albuterol (PROVENTIL HFA;VENTOLIN HFA) 108 (90 Base) MCG/ACT inhaler 2 puff (has no administration in time range)  LORazepam (ATIVAN) tablet 2 mg (2 mg Oral Given 04/17/18 1151)  Initial Impression / Assessment and Plan / ED Course  I have reviewed the triage vital signs and the nursing notes.  Pertinent labs & imaging results that were available during my care of the patient were reviewed by me and considered in my medical decision making (see chart for details).     48 yo M here with ongoing, severe agitation, SI. Just seen at [facility] but taken here by sister 2/2 concern for his own safety. He does appear labile, agitated on exam. I reviewed labs from [facility] - no acute, emergent medical pathology identified. TTS consulted. Ativan given for agitation.  Final Clinical Impressions(s) / ED Diagnoses   Final diagnoses:  Suicidal ideation    ED Discharge Orders    None       Duffy Bruce, MD 04/17/18 1233

## 2018-04-17 NOTE — ED Triage Notes (Signed)
Pt slapping himself in the head in triage.

## 2018-04-17 NOTE — Discharge Instructions (Addendum)
Please seek medical attention and help for any thoughts about wanting to harm yourself, harm others, any concerning change in behavior, severe depression, inappropriate drug use or any other new or concerning symptoms. ° °

## 2018-04-17 NOTE — ED Triage Notes (Signed)
Pt yelling and being aggressive in triage.

## 2018-04-18 ENCOUNTER — Encounter (HOSPITAL_COMMUNITY): Payer: Self-pay | Admitting: Registered Nurse

## 2018-04-18 MED ORDER — LORAZEPAM 1 MG PO TABS
2.0000 mg | ORAL_TABLET | Freq: Once | ORAL | Status: AC
  Administered 2018-04-18: 2 mg via ORAL
  Filled 2018-04-18: qty 2

## 2018-04-18 MED ORDER — LORAZEPAM 0.5 MG PO TABS
0.5000 mg | ORAL_TABLET | Freq: Once | ORAL | Status: AC
  Administered 2018-04-18: 0.5 mg via ORAL
  Filled 2018-04-18: qty 1

## 2018-04-18 NOTE — ED Notes (Addendum)
Pt requesting to be sent home with ativan. Pt states "I do good while I an on that". When asked if he is having suicidal thoughts states "as long as I have the ativan I don't have the thoughts. I just need something to go home with. I have kids at home and I need something". Pt calm and cooperative at this time. Speech is appropriate

## 2018-04-18 NOTE — ED Notes (Signed)
Patient given Ativan 2 mg po for agitation / anxiety when he woke up , assisted to go to toilet to urinate .

## 2018-04-18 NOTE — Discharge Instructions (Addendum)
Follow-up with [facility] outpatient services as scheduled on January 29.

## 2018-04-18 NOTE — ED Provider Notes (Signed)
Chart reviewed, home meds previously ordered, vitals reviewed. Patient in room resting, no further needs at this time.   Tacy Learn, PA-C 04/18/18 4081    Maudie Flakes, MD 04/19/18 [phone]

## 2018-04-18 NOTE — ED Provider Notes (Signed)
Case discussed with behavioral health who has cleared patient for discharge.  Patient is scheduled to follow-up with [facility] outpatient services on January 29.  Patient has been advised he would not be given a prescription for Ativan as requested.   Tacy Learn, PA-C 04/18/18 1208    Isla Pence, MD 04/18/18 1213

## 2018-04-18 NOTE — Consult Note (Signed)
  Tele Assessment   Michael Schmitt, 48 y.o., male patient presented to [facility] with complaints of suicidal ideation and plan to cut his wrist.  Patient was just discharged from [facility] with same complaint.  Patient requesting Ativan stating that it is the only thing that makes him happy and if given a prescription he will no longer be suicidal.  Patient has had an psychiatric hospitalization 03/31/18 and 2 ED visits this week since his discharge.  Patient has an appointment with [facility] (outpatient psychiatric services) today 04/18/18.  Patient seen via telepsych by this provider; chart reviewed and consulted with Dr. Dwyane Dee on 04/18/18.  On evaluation Michael Schmitt reports he came to the hospital because he was having suicidal thought.  Patient states that he lives with his mother/sister/2 kids states that "Ativan is the only thing that calms me down; so I don't think as much.  It's the only thing that works."  Patient states that he has outpatient services at [facility] and has an appointment but unsure of time.  States if he can have an Ativan until he can get there he will be fine.  Patient making abnormal movements of head; when asked about it patient stated "I had neck surgery and it makes it feel better."  Patient states that the movements not involuntary.  Patient then started smacking himself in the face when he was told that he would not be getting a prescription for Ativan; he would need to talk to his psychiatrist about prescribing.  When asked why he was smacking himself he stated "Cause I'm upset."  Patient states that he only need a prescription for Ativan for a short time it doesn't have to be a lot.  Patient denies homicidal ideation, psychosis, and paranoia.  Patient making the suicidal comment to coerce provider into writing a prescription for Ativan  SW checked with [facility] patient missed appointment on 04/10/18 but they rescheduled for 04/24/18 11:59 AM During evaluation Michael Schmitt is  sitting up in bed; he is alert/oriented x 4; calm/cooperative; some irritability when told he was not getting a prescription for Ativan; and began to smack himself in the face.  Patients mood congruent with affect.  Patient is speaking in a clear tone at moderate volume, and normal pace; with good eye contact.  His thought process is coherent and relevant; There is no indication that he is currently responding to internal/external stimuli or experiencing delusional thought content.  Patient denies homicidal ideation, psychosis, and paranoia.  Patient has been very manipulative stating that he is only suicidal if he doesn't get a prescription for Ativan.  Patient encouraged to speak with his outpatient psychiatrist about Ativan "They won't prescribe it to me".  Patient has gone from ED to ED requesting Ativan prescription.  Patient has remained calm throughout assessment and has answered questions appropriately.     Recommendations:  Follow up with [facility] outpatient psychiatric services 04/24/18 11:59 AM  Disposition:  Patient psychiatrically cleared No evidence of imminent risk to self or others at present.   Patient does not meet criteria for psychiatric inpatient admission. Supportive therapy provided about ongoing stressors. Discussed crisis plan, support from social network, calling 911, coming to the Emergency Department, and calling Suicide Hotline.   Spoke with Suella Broad, PA; informed of above recommendation and disposition   Earleen Newport, NP

## 2018-04-20 DIAGNOSIS — Z79899 Other long term (current) drug therapy: Secondary | ICD-10-CM | POA: Diagnosis not present

## 2018-04-20 DIAGNOSIS — I129 Hypertensive chronic kidney disease with stage 1 through stage 4 chronic kidney disease, or unspecified chronic kidney disease: Secondary | ICD-10-CM | POA: Diagnosis not present

## 2018-04-20 DIAGNOSIS — Z9119 Patient's noncompliance with other medical treatment and regimen: Secondary | ICD-10-CM | POA: Diagnosis not present

## 2018-04-20 DIAGNOSIS — Z955 Presence of coronary angioplasty implant and graft: Secondary | ICD-10-CM | POA: Diagnosis not present

## 2018-04-20 DIAGNOSIS — I2510 Atherosclerotic heart disease of native coronary artery without angina pectoris: Secondary | ICD-10-CM | POA: Diagnosis not present

## 2018-04-20 DIAGNOSIS — R451 Restlessness and agitation: Secondary | ICD-10-CM | POA: Diagnosis not present

## 2018-04-20 DIAGNOSIS — Z823 Family history of stroke: Secondary | ICD-10-CM | POA: Diagnosis not present

## 2018-04-20 DIAGNOSIS — E1122 Type 2 diabetes mellitus with diabetic chronic kidney disease: Secondary | ICD-10-CM | POA: Diagnosis not present

## 2018-04-20 DIAGNOSIS — E1165 Type 2 diabetes mellitus with hyperglycemia: Secondary | ICD-10-CM | POA: Diagnosis not present

## 2018-04-20 DIAGNOSIS — N189 Chronic kidney disease, unspecified: Secondary | ICD-10-CM | POA: Diagnosis not present

## 2018-04-20 DIAGNOSIS — Z880 Allergy status to penicillin: Secondary | ICD-10-CM | POA: Diagnosis not present

## 2018-04-20 DIAGNOSIS — Z7982 Long term (current) use of aspirin: Secondary | ICD-10-CM | POA: Diagnosis not present

## 2018-04-20 DIAGNOSIS — I252 Old myocardial infarction: Secondary | ICD-10-CM | POA: Diagnosis not present

## 2018-04-20 DIAGNOSIS — R000 Tachycardia, unspecified: Secondary | ICD-10-CM | POA: Diagnosis not present

## 2018-04-20 DIAGNOSIS — R9431 Abnormal electrocardiogram [ECG] [EKG]: Secondary | ICD-10-CM | POA: Diagnosis not present

## 2018-04-23 DIAGNOSIS — I10 Essential (primary) hypertension: Secondary | ICD-10-CM | POA: Diagnosis not present

## 2018-04-23 DIAGNOSIS — J45998 Other asthma: Secondary | ICD-10-CM | POA: Diagnosis not present

## 2018-04-23 DIAGNOSIS — E119 Type 2 diabetes mellitus without complications: Secondary | ICD-10-CM | POA: Diagnosis not present

## 2018-04-23 DIAGNOSIS — I2510 Atherosclerotic heart disease of native coronary artery without angina pectoris: Secondary | ICD-10-CM | POA: Diagnosis not present

## 2018-05-07 ENCOUNTER — Emergency Department
Admission: EM | Admit: 2018-05-07 | Discharge: 2018-05-08 | Disposition: A | Discharge status: Home / Self Care | Payer: Medicare Other | Attending: Emergency Medicine | Admitting: Emergency Medicine

## 2018-05-07 ENCOUNTER — Other Ambulatory Visit: Payer: Self-pay

## 2018-05-07 ENCOUNTER — Encounter: Payer: Self-pay | Admitting: Emergency Medicine

## 2018-05-07 DIAGNOSIS — F17210 Nicotine dependence, cigarettes, uncomplicated: Secondary | ICD-10-CM | POA: Diagnosis not present

## 2018-05-07 DIAGNOSIS — I2510 Atherosclerotic heart disease of native coronary artery without angina pectoris: Secondary | ICD-10-CM | POA: Diagnosis not present

## 2018-05-07 DIAGNOSIS — F329 Major depressive disorder, single episode, unspecified: Secondary | ICD-10-CM | POA: Insufficient documentation

## 2018-05-07 DIAGNOSIS — R455 Hostility: Secondary | ICD-10-CM

## 2018-05-07 DIAGNOSIS — F419 Anxiety disorder, unspecified: Secondary | ICD-10-CM | POA: Diagnosis not present

## 2018-05-07 DIAGNOSIS — E785 Hyperlipidemia, unspecified: Secondary | ICD-10-CM | POA: Insufficient documentation

## 2018-05-07 DIAGNOSIS — R739 Hyperglycemia, unspecified: Secondary | ICD-10-CM

## 2018-05-07 DIAGNOSIS — F339 Major depressive disorder, recurrent, unspecified: Secondary | ICD-10-CM | POA: Insufficient documentation

## 2018-05-07 DIAGNOSIS — F250 Schizoaffective disorder, bipolar type: Secondary | ICD-10-CM | POA: Diagnosis not present

## 2018-05-07 DIAGNOSIS — R45851 Suicidal ideations: Secondary | ICD-10-CM | POA: Insufficient documentation

## 2018-05-07 DIAGNOSIS — E1165 Type 2 diabetes mellitus with hyperglycemia: Secondary | ICD-10-CM | POA: Diagnosis not present

## 2018-05-07 DIAGNOSIS — I10 Essential (primary) hypertension: Secondary | ICD-10-CM | POA: Diagnosis not present

## 2018-05-07 DIAGNOSIS — Z79899 Other long term (current) drug therapy: Secondary | ICD-10-CM | POA: Diagnosis not present

## 2018-05-07 LAB — URINE DRUG SCREEN, QUALITATIVE (ARMC ONLY)
AMPHETAMINES, UR SCREEN: NOT DETECTED
Barbiturates, Ur Screen: NOT DETECTED
Benzodiazepine, Ur Scrn: NOT DETECTED
Cannabinoid 50 Ng, Ur ~~LOC~~: NOT DETECTED
Cocaine Metabolite,Ur ~~LOC~~: NOT DETECTED
MDMA (Ecstasy)Ur Screen: NOT DETECTED
Methadone Scn, Ur: NOT DETECTED
OPIATE, UR SCREEN: NOT DETECTED
Phencyclidine (PCP) Ur S: NOT DETECTED
Tricyclic, Ur Screen: POSITIVE — AB

## 2018-05-07 LAB — COMPREHENSIVE METABOLIC PANEL
ALT: 38 U/L (ref 0–44)
AST: 36 U/L (ref 15–41)
Albumin: 4.5 g/dL (ref 3.5–5.0)
Alkaline Phosphatase: 105 U/L (ref 38–126)
Anion gap: 13 (ref 5–15)
BUN: 11 mg/dL (ref 6–20)
CO2: 19 mmol/L — ABNORMAL LOW (ref 22–32)
Calcium: 9 mg/dL (ref 8.9–10.3)
Chloride: 102 mmol/L (ref 98–111)
Creatinine, Ser: 1.13 mg/dL (ref 0.61–1.24)
GFR calc Af Amer: >60 mL/min (ref >60)
GFR calc non Af Amer: >60 mL/min (ref >60)
Glucose, Bld: 219 mg/dL — ABNORMAL HIGH (ref 70–99)
Potassium: 3.7 mmol/L (ref 3.5–5.1)
Sodium: 134 mmol/L — ABNORMAL LOW (ref 135–145)
Total Bilirubin: 0.6 mg/dL (ref 0.3–1.2)
Total Protein: 7.8 g/dL (ref 6.5–8.1)

## 2018-05-07 LAB — CBC
HEMATOCRIT: 46.2 % (ref 39.0–52.0)
Hemoglobin: 15.9 g/dL (ref 13.0–17.0)
MCH: 31.5 pg (ref 26.0–34.0)
MCHC: 34.4 g/dL (ref 30.0–36.0)
MCV: 91.7 fL (ref 80.0–100.0)
Platelets: 358 10*3/uL (ref 150–400)
RBC: 5.04 MIL/uL (ref 4.22–5.81)
RDW: 15.2 % (ref 11.5–15.5)
WBC: 11.8 10*3/uL — ABNORMAL HIGH (ref 4.0–10.5)
nRBC: 0.3 % — ABNORMAL HIGH (ref 0.0–0.2)

## 2018-05-07 LAB — ACETAMINOPHEN LEVEL: Acetaminophen (Tylenol), Serum: <10 ug/mL — ABNORMAL LOW (ref 10–30)

## 2018-05-07 LAB — ETHANOL: Alcohol, Ethyl (B): <10 mg/dL (ref <10)

## 2018-05-07 LAB — VALPROIC ACID LEVEL: Valproic Acid Lvl: <10 ug/mL — ABNORMAL LOW (ref 50.0–100.0)

## 2018-05-07 LAB — SALICYLATE LEVEL: Salicylate Lvl: <7 mg/dL (ref 2.8–30.0)

## 2018-05-07 MED ORDER — DIPHENHYDRAMINE HCL 50 MG/ML IJ SOLN: 50.0000 mg | Freq: Four times a day (QID) | INTRAMUSCULAR | Status: DC | PRN

## 2018-05-07 MED ORDER — ATORVASTATIN CALCIUM 20 MG PO TABS
80.0000 mg | ORAL_TABLET | Freq: Every day | ORAL | Status: DC
  Administered 2018-05-07: 80 mg via ORAL
  Filled 2018-05-07: qty 4

## 2018-05-07 MED ORDER — DIPHENHYDRAMINE HCL 25 MG PO CAPS
50.0000 mg | ORAL_CAPSULE | Freq: Four times a day (QID) | ORAL | Status: DC | PRN
  Administered 2018-05-08: 50 mg via ORAL
  Filled 2018-05-07: qty 2

## 2018-05-07 MED ORDER — FLUPHENAZINE HCL 2.5 MG/ML IJ SOLN
2.5000 mg | Freq: Four times a day (QID) | INTRAMUSCULAR | Status: DC | PRN
  Filled 2018-05-07: qty 1

## 2018-05-07 MED ORDER — ASPIRIN EC 81 MG PO TBEC
81.0000 mg | DELAYED_RELEASE_TABLET | Freq: Every day | ORAL | Status: DC
  Administered 2018-05-07 – 2018-05-08 (×2): 81 mg via ORAL
  Filled 2018-05-07 (×2): qty 1

## 2018-05-07 MED ORDER — SERTRALINE HCL 100 MG PO TABS: 100.0000 mg | ORAL_TABLET | Freq: Every day | ORAL | Status: DC

## 2018-05-07 MED ORDER — LORAZEPAM 2 MG PO TABS
2.0000 mg | ORAL_TABLET | Freq: Once | ORAL | Status: AC
  Administered 2018-05-07: 2 mg via ORAL
  Filled 2018-05-07: qty 1

## 2018-05-07 MED ORDER — LORAZEPAM 2 MG/ML IJ SOLN: 2.0000 mg | Freq: Four times a day (QID) | INTRAMUSCULAR | Status: DC | PRN

## 2018-05-07 MED ORDER — HALOPERIDOL 5 MG PO TABS
5.0000 mg | ORAL_TABLET | Freq: Once | ORAL | Status: AC
  Administered 2018-05-07: 5 mg via ORAL
  Filled 2018-05-07: qty 1

## 2018-05-07 MED ORDER — DIPHENHYDRAMINE HCL 25 MG PO CAPS
25.0000 mg | ORAL_CAPSULE | Freq: Once | ORAL | Status: AC
  Administered 2018-05-07: 25 mg via ORAL
  Filled 2018-05-07: qty 1

## 2018-05-07 MED ORDER — DIVALPROEX SODIUM ER 500 MG PO TB24: 1000.0000 mg | ORAL_TABLET | Freq: Every day | ORAL | Status: DC

## 2018-05-07 MED ORDER — CLONIDINE HCL 0.1 MG PO TABS
0.1000 mg | ORAL_TABLET | Freq: Three times a day (TID) | ORAL | Status: DC
  Administered 2018-05-07 – 2018-05-08 (×3): 0.1 mg via ORAL
  Filled 2018-05-07 (×3): qty 1

## 2018-05-07 MED ORDER — METFORMIN HCL 500 MG PO TABS
500.0000 mg | ORAL_TABLET | Freq: Two times a day (BID) | ORAL | Status: DC
  Administered 2018-05-07 – 2018-05-08 (×2): 500 mg via ORAL
  Filled 2018-05-07 (×2): qty 1

## 2018-05-07 MED ORDER — OXYMETAZOLINE HCL 0.05 % NA SOLN: 1.0000 | Freq: Once | NASAL | Status: DC

## 2018-05-07 MED ORDER — QUETIAPINE FUMARATE 200 MG PO TABS
200.0000 mg | ORAL_TABLET | Freq: Every day | ORAL | Status: DC
  Administered 2018-05-07: 200 mg via ORAL
  Filled 2018-05-07: qty 1

## 2018-05-07 MED ORDER — LORAZEPAM 1 MG PO TABS
1.0000 mg | ORAL_TABLET | Freq: Four times a day (QID) | ORAL | Status: DC | PRN
  Filled 2018-05-07: qty 1

## 2018-05-07 MED ORDER — FLUPHENAZINE HCL 5 MG PO TABS
5.0000 mg | ORAL_TABLET | Freq: Two times a day (BID) | ORAL | Status: DC
  Administered 2018-05-07 – 2018-05-08 (×2): 5 mg via ORAL
  Filled 2018-05-07 (×4): qty 1

## 2018-05-07 MED ORDER — FLUVOXAMINE MALEATE 50 MG PO TABS
50.0000 mg | ORAL_TABLET | Freq: Every day | ORAL | Status: DC
  Filled 2018-05-07 (×2): qty 1

## 2018-05-07 MED ORDER — DIVALPROEX SODIUM ER 500 MG PO TB24
1500.0000 mg | ORAL_TABLET | Freq: Every day | ORAL | Status: AC
  Administered 2018-05-07: 1500 mg via ORAL
  Filled 2018-05-07: qty 3

## 2018-05-07 MED ORDER — LAMOTRIGINE 100 MG PO TABS
100.0000 mg | ORAL_TABLET | Freq: Every day | ORAL | Status: DC
  Administered 2018-05-07: 100 mg via ORAL
  Filled 2018-05-07: qty 1

## 2018-05-07 MED ORDER — CARVEDILOL 6.25 MG PO TABS
6.2500 mg | ORAL_TABLET | Freq: Two times a day (BID) | ORAL | Status: DC
  Administered 2018-05-07 – 2018-05-08 (×2): 6.25 mg via ORAL
  Filled 2018-05-07 (×4): qty 1

## 2018-05-07 NOTE — BH Assessment (Signed)
Assessment Note  Michael Schmitt is an 48 y.o. male who presents to ED reporting "I am very depressed. I just want to kill myself. I would slice my wrist. I'm a single parent of 3 and they need me". Pt reports he attempted suicide in the past but trying to shoot himself. Pt has had several inpatient admissions in the past. Pt denied AVH.  Diagnosis: Depression  Past Medical History:  Past Medical History:  Diagnosis Date  . Anxiety   . Anxiety   . Arthritis    cerv. stenosis, spondylosis, HNP- lower back , has been followed in pain clinic, has  had injection s in cerv. area  . Blood dyscrasia    told that when he was young he was a" free bleeder"  . CAD (coronary artery disease)   . Cervical spondylosis without myelopathy 07/24/2014  . Cervicogenic headache 07/24/2014  . Chronic kidney disease    renal calculi- passed spontaneously  . Depression   . Diabetes mellitus without complication ([place])   . Fatty liver   . GERD (gastroesophageal reflux disease)   . Headache(784.0)   . Hyperlipidemia   . Hypertension   . Mental disorder   . MI, old   . RLS (restless legs syndrome)    detected on sleep study  . Shortness of breath   . Ventricular fibrillation ([place]) 04/05/2016    Past Surgical History:  Procedure Laterality Date  . ANTERIOR CERVICAL DECOMP/DISCECTOMY FUSION  11/13/2011   Procedure: ANTERIOR CERVICAL DECOMPRESSION/DISCECTOMY FUSION 1 LEVEL;  Surgeon: [name], MD;  Location: [place] [facility];  Service: Neurosurgery;  Laterality: N/A;  Anterior Cervical Decompression/discectomy Fusion. Cervical three-four.  Marland Kitchen CARDIAC CATHETERIZATION N/A 01/01/2015   Procedure: Left Heart Cath and Coronary Angiography;  Surgeon: [name], MD;  Location: [place] [facility];  Service: Cardiovascular;  Laterality: N/A;  . CARDIAC CATHETERIZATION N/A 04/05/2016   Procedure: Left Heart Cath and Coronary Angiography;  Surgeon: Belva Crome, MD;  Location: [place] [facility];  Service:  Cardiovascular;  Laterality: N/A;  . CARDIAC CATHETERIZATION N/A 04/05/2016   Procedure: Coronary Stent Intervention;  Surgeon: Belva Crome, MD;  Location: [place] [facility];  Service: Cardiovascular;  Laterality: N/A;  . CARDIAC CATHETERIZATION N/A 04/05/2016   Procedure: Intravascular Ultrasound/IVUS;  Surgeon: Belva Crome, MD;  Location: [place] [facility];  Service: Cardiovascular;  Laterality: N/A;  . NASAL SINUS SURGERY     2005    Family History:  Family History  Problem Relation Age of Onset  . Prostate cancer Father   . Hypertension Mother   . Kidney Stones Mother   . Anxiety disorder Mother   . Depression Mother   . COPD Sister   . Hypertension Sister   . Diabetes Sister   . Depression Sister   . Anxiety disorder Sister   . Seizures Sister   . ADD / ADHD Son   . ADD / ADHD Daughter     Social History:  reports that he has been smoking cigarettes and cigars. He has a 34.00 pack-year smoking history. He has quit using smokeless tobacco. He reports that he does not drink alcohol or use drugs.  Additional Social History:  Alcohol / Drug Use Pain Medications: See MAR Prescriptions: See MAR Over the Counter: See MAR History of alcohol / drug use?: No history of alcohol / drug abuse Longest period of sobriety (when/how long): NA Negative Consequences of Use: Personal relationships, Scientist, research (physical sciences), Museum/gallery curator, Work / Government social research officer  Symptoms: Agitation, Irritability  CIWA: CIWA-Ar BP: 119/86 Pulse Rate: (!) 123 COWS:    Allergies:  Allergies  Allergen Reactions  . Prednisone Other (See Comments)    Hypertension and makes him feel "spacey"  . Hydrocodone Other (See Comments)    Causes headaches and makes the patient irritable  . Trazodone And Nefazodone Other (See Comments)    Patient stated that this keeps him awake; "has an opposite effect on me"  . Varenicline Other (See Comments)    Suicidal thoughts    Home Medications: (Not in a hospital  admission)   OB/GYN Status:  No LMP for male patient.  General Assessment Data Location of Assessment: [facility] ED TTS Assessment: In system Is this a Tele or Face-to-Face Assessment?: Face-to-Face Is this an Initial Assessment or a Re-assessment for this encounter?: Initial Assessment Patient Accompanied by:: N/A Language Other than English: No Living Arrangements: Other (Comment) What gender do you identify as?: Male Marital status: Widowed [place] name: n/a Pregnancy Status: No Living Arrangements: Other relatives, Children Can pt return to current living arrangement?: Yes Admission Status: Voluntary Is patient capable of signing voluntary admission?: Yes Referral Source: Self/Family/Friend Insurance type: [facility] MCR  Medical Screening Exam ([place]) Medical Exam completed: Yes  Crisis Care Plan Living Arrangements: Other relatives, Children Legal Guardian: Other:(Self) Name of Psychiatrist: Reports of none Name of Therapist: [facility]  Education Status Is patient currently in school?: No Is the patient employed, unemployed or receiving disability?: Receiving disability income  Risk to self with the past 6 months Suicidal Ideation: Yes-Currently Present Has patient been a risk to self within the past 6 months prior to admission? : Yes Suicidal Intent: Yes-Currently Present Has patient had any suicidal intent within the past 6 months prior to admission? : Yes Is patient at risk for suicide?: Yes Suicidal Plan?: Yes-Currently Present Has patient had any suicidal plan within the past 6 months prior to admission? : Yes Specify Current Suicidal Plan: Cut wrist Access to Means: Yes Specify Access to Suicidal Means: access to sharp objects What has been your use of drugs/alcohol within the last 12 months?: None Reported Previous Attempts/Gestures: Yes How many times?: 1 Other Self Harm Risks: None Reported Triggers for Past Attempts: None known Intentional Self  Injurious Behavior: None Family Suicide History: No Recent stressful life event(s): Other (Comment) Persecutory voices/beliefs?: No Depression: Yes Depression Symptoms: Feeling worthless/self pity, Guilt, Isolating Substance abuse history and/or treatment for substance abuse?: No Suicide prevention information given to non-admitted patients: Not applicable  Risk to Others within the past 6 months Homicidal Ideation: No Does patient have any lifetime risk of violence toward others beyond the six months prior to admission? : No Thoughts of Harm to Others: No Current Homicidal Intent: No Current Homicidal Plan: No Access to Homicidal Means: No Identified Victim: None Reported History of harm to others?: No Assessment of Violence: None Noted Violent Behavior Description: None Reported Does patient have access to weapons?: No Criminal Charges Pending?: No Does patient have a court date: No Is patient on probation?: No  Psychosis Hallucinations: None noted Delusions: None noted  Mental Status Report Appearance/Hygiene: In scrubs, Unremarkable Eye Contact: Fair Motor Activity: Freedom of movement Speech: Logical/coherent Level of Consciousness: Alert Mood: Sad, Depressed Affect: Appropriate to circumstance Anxiety Level: Minimal Thought Processes: Coherent, Relevant Judgement: Unimpaired Orientation: Person, Place, Time, Situation, Appropriate for developmental age Obsessive Compulsive Thoughts/Behaviors: Minimal  Cognitive Functioning Concentration: Normal Memory: Recent Intact, Remote Intact Is patient IDD: No Insight: Fair Impulse  Control: Fair Appetite: Fair Have you had any weight changes? : No Change Sleep: No Change Total Hours of Sleep: 6 Vegetative Symptoms: None  ADLScreening [facility] Assessment Services) Patient's cognitive ability adequate to safely complete daily activities?: Yes Patient able to express need for assistance with ADLs?: Yes Independently  performs ADLs?: Yes (appropriate for developmental age)  Prior Inpatient Therapy Prior Inpatient Therapy: Yes Prior Therapy Dates: Multiple Hospitalizations, most recent 03/2018 Prior Therapy Facilty/Provider(s): [facility] Reason for Treatment: Depression  Prior Outpatient Therapy Prior Outpatient Therapy: Yes Prior Therapy Dates: current Prior Therapy Facilty/Provider(s): [facility] Reason for Treatment: depression Does patient have an ACCT team?: No Does patient have Intensive In-House Services?  : No Does patient have [facility] services? : No Does patient have P4CC services?: No  ADL Screening (condition at time of admission) Patient's cognitive ability adequate to safely complete daily activities?: Yes Patient able to express need for assistance with ADLs?: Yes Independently performs ADLs?: Yes (appropriate for developmental age)       Abuse/Neglect Assessment (Assessment to be complete while patient is alone) Abuse/Neglect Assessment Can Be Completed: Yes Physical Abuse: Denies Verbal Abuse: Denies Sexual Abuse: Denies Exploitation of patient/patient's resources: Denies Self-Neglect: Denies Values / Beliefs Cultural Requests During Hospitalization: None Spiritual Requests During Hospitalization: None Consults Spiritual Care Consult Needed: No Social Work Consult Needed: No Regulatory affairs officer (For Healthcare) Does Patient Have a Medical Advance Directive?: No       Child/Adolescent Assessment Running Away Risk: (Patient is an adult)  Disposition:  Disposition Initial Assessment Completed for this Encounter: Yes Disposition of Patient: (Pending Psych Consult)  On Site Evaluation by:   Reviewed with Physician:    Frederich Cha 05/07/2018 3:32 PM

## 2018-05-07 NOTE — ED Notes (Addendum)
ED Provider at bedside. 

## 2018-05-07 NOTE — ED Notes (Signed)
Hourly rounding reveals patient in room. No complaints, stable, in no acute distress. Q15 minute rounds and monitoring via Security Cameras to continue. 

## 2018-05-07 NOTE — ED Triage Notes (Signed)
Brought by [facility]pd for psych eval.  Patient is loud in triage.  Yelling t o "help me"  Very loud and is disturbing to other patients.  Says he wants to go to long term facility.  Has been taking his meds.  Says he wants meds to calm him now.  [facility].  His was able to sit down for triage, but was pacing and arumentative witht the officer.

## 2018-05-07 NOTE — ED Notes (Signed)
Pt yelling "Help" in random outbursts while in room. Pt asking what doctors are here. States he wants to be medicated. Wants to be admitted. Pt states he has been suicidal for 5 months. Denies plan, actions or intent at this time. Verbally loud. Cooperative when this RN requesting asking him to change into our policy clothing. Contracts for safety.

## 2018-05-07 NOTE — ED Notes (Signed)
Pt expressed concerns for when he goes home "I feel like if I go home I will hurt one of my kids." Pt has two kids ages 31 and 36. Pt asking if there is a place for people like him. This tech made pt aware that it is up to psychiatrist rather he is committed or not. Ally,RN made aware of pt comments.

## 2018-05-07 NOTE — ED Notes (Addendum)
Pt dressed into behvairal cltohing with this RN at bedside. Pt does so cooperatively. Jeans, sneakers, socks, underwear and shirt placed in 1 belonging bag. Secured by this Therapist, sports. Pt states he wants to be admitted. Random outbursts of shouting "help".

## 2018-05-07 NOTE — ED Notes (Signed)
TTS at bedside. 

## 2018-05-07 NOTE — ED Provider Notes (Addendum)
[facility] Emergency Department Provider Note  ____________________________________________  Time seen: Approximately 10:44 AM  I have reviewed the triage vital signs and the nursing notes.   HISTORY  Chief Complaint Psychiatric Evaluation    HPI Michael Schmitt is a 48 y.o. male with a history of major depressive disorder with psychotic features, generalized anxiety, personality disorder, and multiple chronic medical comorbidities, presenting for suicidal ideations.  The patient has a history of chronic suicidality and states that he wants to kill himself.  Upon arrival to the emergency department he is having loud aggressive outbursts.  He states he would like to be admitted to a long-term care facility.  He has not had any acute medical complaints.  Past Medical History:  Diagnosis Date  . Anxiety   . Anxiety   . Arthritis    cerv. stenosis, spondylosis, HNP- lower back , has been followed in pain clinic, has  had injection s in cerv. area  . Blood dyscrasia    told that when he was young he was a" free bleeder"  . CAD (coronary artery disease)   . Cervical spondylosis without myelopathy 07/24/2014  . Cervicogenic headache 07/24/2014  . Chronic kidney disease    renal calculi- passed spontaneously  . Depression   . Diabetes mellitus without complication ([place])   . Fatty liver   . GERD (gastroesophageal reflux disease)   . Headache(784.0)   . Hyperlipidemia   . Hypertension   . Mental disorder   . MI, old   . RLS (restless legs syndrome)    detected on sleep study  . Shortness of breath   . Ventricular fibrillation ([place]) 04/05/2016    Patient Active Problem List   Diagnosis Date Noted  . Depression, major, recurrent, severe with psychosis ([place]) 03/30/2018  . Personality disorder ([place]) 12/31/2017  . MDD (major depressive disorder), recurrent episode, severe ([place]) 11/20/2017  . Dysthymia 10/26/2017  . Severe recurrent major depression  without psychotic features ([place]) 03/12/2017  . Generalized anxiety disorder 02/05/2017  . Chronic neck pain (Primary Area of Pain) (Left) 11/22/2016  . Cervical facet syndrome (Left) 11/22/2016  . Cervical radiculitis (Left) 11/22/2016  . History of cervical spinal surgery 11/22/2016  . Cervical spondylosis 11/22/2016  . Chronic shoulder pain (Secondary Area of Pain) (Left) 11/22/2016  . Arthropathy of shoulder (Left) 11/22/2016  . Chronic low back pain [facility] Area of Pain) (Bilateral) (L>R) 11/22/2016  . Lumbar facet syndrome (Bilateral) (L>R) 11/22/2016  . Chronic pain syndrome 11/21/2016  . Long term prescription benzodiazepine use 11/21/2016  . Opiate use 11/21/2016  . Uncontrolled type 2 diabetes mellitus without complication, without long-term current use of insulin ([place]) 10/19/2016  . Chronic Suicidal ideation 09/15/2016  . Coronary artery disease 09/12/2016  . HTN (hypertension) 06/26/2016  . GERD (gastroesophageal reflux disease) 06/26/2016  . Alcohol use disorder, severe, 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[place]) 06/26/2016  . Benzodiazepine abuse ([place]) 06/11/2016  . Sedative, hypnotic or anxiolytic use disorder, severe, dependence ([place]) 05/12/2016  . Ventricular fibrillation ([place]) 04/05/2016  . Combined hyperlipidemia 01/05/2016  . Cervical spondylosis without myelopathy 07/24/2014  . Severe episode of recurrent major depressive disorder, without psychotic features ([place]) 04/15/2013  . Tobacco use disorder 10/20/2010  . Asthma 10/20/2010    Past Surgical History:  Procedure Laterality Date  . ANTERIOR CERVICAL DECOMP/DISCECTOMY FUSION  11/13/2011   Procedure: ANTERIOR CERVICAL DECOMPRESSION/DISCECTOMY FUSION 1 LEVEL;  Surgeon: [name], MD;  Location: [place] [facility];  Service: Neurosurgery;  Laterality: N/A;  Anterior  Cervical Decompression/discectomy Fusion. Cervical three-four.  Marland Kitchen CARDIAC CATHETERIZATION N/A 01/01/2015   Procedure: Left Heart Cath and Coronary Angiography;  Surgeon:  [name], MD;  Location: [place] [facility];  Service: Cardiovascular;  Laterality: N/A;  . CARDIAC CATHETERIZATION N/A 04/05/2016   Procedure: Left Heart Cath and Coronary Angiography;  Surgeon: Belva Crome, MD;  Location: [place] [facility];  Service: Cardiovascular;  Laterality: N/A;  . CARDIAC CATHETERIZATION N/A 04/05/2016   Procedure: Coronary Stent Intervention;  Surgeon: Belva Crome, MD;  Location: [place] [facility];  Service: Cardiovascular;  Laterality: N/A;  . CARDIAC CATHETERIZATION N/A 04/05/2016   Procedure: Intravascular Ultrasound/IVUS;  Surgeon: Belva Crome, MD;  Location: [place] [facility];  Service: Cardiovascular;  Laterality: N/A;  . NASAL SINUS SURGERY     2005    Current Outpatient Rx  . Order #: [id number] Class: No Print  . Order #: [id number] Class: No Print  . Order #: [id number] Class: No Print  . Order #: [id number] Class: Print  . Order #: [id number] Class: Historical Med  . Order #: [id number] Class: Print  . Order #: [id number] Class: No Print  . Order #: [id number] Class: No Print  . Order #: [id number] Class: Print  . Order #: [id number] Class: Print  . Order #: [id number] Class: Historical Med    Allergies Prednisone; Hydrocodone; Trazodone and nefazodone; and Varenicline  Family History  Problem Relation Age of Onset  . Prostate cancer Father   . Hypertension Mother   . Kidney Stones Mother   . Anxiety disorder Mother   . Depression Mother   . COPD Sister   . Hypertension Sister   . Diabetes Sister   . Depression Sister   . Anxiety disorder Sister   . Seizures Sister   . ADD / ADHD Son   . ADD / ADHD Daughter     Social History Social History   Tobacco Use  . Smoking status: Current Every Day Smoker    Packs/day: 2.00    Years: 17.00    Pack years: 34.00    Types: Cigarettes, Cigars  . Smokeless tobacco: Former Systems developer  . Tobacco comment: occassional snuff   Substance Use Topics  . Alcohol use: No    Alcohol/week: 0.0 standard drinks     Comment: last drinked in 3 months.   . Drug use: No    Review of Systems Constitutional: No fever/chills.  No lightheadedness or syncope. Eyes: No visual changes. ENT: No sore throat. No congestion or rhinorrhea. Cardiovascular: Denies chest pain. Denies palpitations. Respiratory: Denies shortness of breath.  No cough. Gastrointestinal: No abdominal pain.  No nausea, no vomiting.  No diarrhea.  No constipation. Genitourinary: Negative for dysuria. Musculoskeletal: Negative for back pain. Skin: Negative for rash. Neurological: Negative for headaches. No focal numbness, tingling or weakness.  Psychiatric:Positive SI.  No HI or hallucinations.  ____________________________________________   PHYSICAL EXAM:  VITAL SIGNS: ED Triage Vitals  Enc Vitals Group     BP 05/07/18 1032 119/86     Pulse Rate 05/07/18 1032 (!) 123     Resp 05/07/18 1032 18     Temp 05/07/18 1032 98.2 F (36.8 C)     Temp Source 05/07/18 1032 Oral     SpO2 05/07/18 1032 96 %     Weight 05/07/18 1033 211 lb 10.3 oz (96 kg)     Height 05/07/18 1033 6\' 2"  (1.88 m)     Head Circumference --      Peak Flow --      Pain  Score --      Pain Loc --      Pain Edu? --      Excl. in [place]? --     Constitutional: The patient is alert, speech is clear and he is walking normally.  He has intermittent loud outbursts where he is screaming and aggressive.  GCS is 15. Eyes: Conjunctivae are normal.  EOMI. No scleral icterus. Head: Atraumatic. Nose: No congestion/rhinnorhea. Mouth/Throat: Mucous membranes are moist.  Neck: No stridor.  Supple.  Mild JVD.  No meningismus. Cardiovascular: Normal rate, regular rhythm. No murmurs, rubs or gallops.  Respiratory: Normal respiratory effort.  No accessory muscle use or retractions. Lungs CTAB.  No wheezes, rales or ronchi. Musculoskeletal: Moves all extremities well.. Neurologic: Alert.Marland Kitchen  Speech is clear.  Face and smile are symmetric.  EOMI.  Moves all extremities well. Skin:   Skin is warm, dry and intact. No rash noted. Psychiatric: Aggressive mood and affect.  Patient endorses SI on my exam.  Denies HI or hallucinations.  ____________________________________________   LABS (all labs ordered are listed, but only abnormal results are displayed)  Labs Reviewed  COMPREHENSIVE METABOLIC PANEL - Abnormal; Notable for the following components:      Result Value   Sodium 134 (*)    CO2 19 (*)    Glucose, Bld 219 (*)    All other components within normal limits  ACETAMINOPHEN LEVEL - Abnormal; Notable for the following components:   Acetaminophen (Tylenol), Serum <10 (*)    All other components within normal limits  CBC - Abnormal; Notable for the following components:   WBC 11.8 (*)    nRBC 0.3 (*)    All other components within normal limits  URINE DRUG SCREEN, QUALITATIVE ([facility] ONLY) - Abnormal; Notable for the following components:   Tricyclic, Ur Screen POSITIVE (*)    All other components within normal limits  ETHANOL  SALICYLATE LEVEL   ____________________________________________  EKG  Not indicated ____________________________________________  RADIOLOGY  No results found.  ____________________________________________   PROCEDURES  Procedure(s) performed: None  Procedures  Critical Care performed: No ____________________________________________   INITIAL IMPRESSION / ASSESSMENT AND PLAN / ED COURSE  Pertinent labs & imaging results that were available during my care of the patient were reviewed by me and considered in my medical decision making (see chart for details).  48 y.o. male with depression, anxiety and personality disorder, as well as multiple chronic medical comorbidities, presenting with suicidality.  Overall, the patient is tachycardic but he is significantly agitated on my exam.  This is likely the cause of his tachycardia and we will repeat his vital signs once he has become more calm.  He is requesting medication to  help him "calm down," I have ordered oral Ativan, Haldol and Benadryl.  The patient has no acute medical complaints today.  I will plan to consult CTS, psychiatry, and laced the patient under involuntary commitment at this time.  ----------------------------------------- 12:54 PM on 05/07/2018 -----------------------------------------  Patient has some hyperglycemia, and I have reordered his home medications including metformin.  We will continue to monitor this.  He has become more calm after medication, and has been medically cleared for psychiatric position.  ____________________________________________  FINAL CLINICAL IMPRESSION(S) / ED DIAGNOSES  Final diagnoses:  Suicidal ideation  Aggressive outburst  Hyperglycemia         NEW MEDICATIONS STARTED DURING THIS VISIT:  New Prescriptions   No medications on file      Eula Listen, MD 05/07/18 1052  Eula Listen, MD 05/07/18 1254

## 2018-05-07 NOTE — ED Notes (Signed)
NP at bedside to assess patient

## 2018-05-07 NOTE — ED Notes (Signed)
Pt up to bathroom. Continues to ask where psychiatrist is.

## 2018-05-07 NOTE — ED Notes (Signed)
Pt with random outburst of "HELP" in loud voice. Pt states that he "I just want help, are they admitting me or not?". Explained plan of care.

## 2018-05-07 NOTE — Consult Note (Signed)
[facility] Psychiatry Consult   Reason for Consult:  Suicidal Ideation Referring Physician:  Dr. Mariea Clonts Patient Identification: Ariv Penrod MRN:  [id number] Principal Diagnosis: Suicidal Ideation Diagnosis:  Suicidal Ideation  Total Time spent with patient: 1 hour  Subjective: "I can't go home. I need to go to a place where I can get longterm care."  Desjuan Stearns is a 48 y.o. male patient who was seen in the ED for a Psychiatric consult due to him voicing that he wants to kill himself. Patient is alert and oriented x 4. During patient encounter he voiced "what do I have to say to get me to be admitted?" Patient was calm and cooperative. Patient was exhibiting some anxiety due to him asking about what medication is he going to get? The patient does not appear to be responding to internal, external stimuli or delusional thoughts. The patient does admit to having suicidal ideation with a plan. He also admits to having homicidal ideations. He states if anyone bothers him he will hurt them.  Dr. Leverne Humbles met with the patient and made some changes to his medications. Clonidine 0.1 mg three times a day, Prolixin 5 mg Bid, Luvox 50 mg at hs, Depakote 1500 mg at bedtime x1 and Depakote 1000 mg at bedtime on 05/08/18. The patient will be observe overnight and will be assess in the morning.  HPI:  Per Dr. Mariea Clonts; Mylo Choi is a 48 y.o. male with a history of major depressive disorder with psychotic features, generalized anxiety, personality disorder, and multiple chronic medical comorbidities, presenting for suicidal ideations.  The patient has a history of chronic suicidality and states that he wants to kill himself.  Upon arrival to the emergency department he is having loud aggressive outbursts.  He states he would like to be admitted to a long-term care facility.  He has not had any acute medical complaints.  Collaboration: Encounter with the patient sister (Ke[place],  [phone]). The sister states he is "very suicidal, depressed and wants to die." The sister states he needs to be in a long-term facility. The sister also voiced that he can not returned her home. The sister states her brother is very destructive and she can not have him her home due to his violent outburst, Past Psychiatric History:  MDD severe Anxiety d/o Suicidal Ideation  Risk to Self: Suicidal Ideation: Yes-Currently Present Suicidal Intent: Yes-Currently Present Is patient at risk for suicide?: Yes Suicidal Plan?: Yes-Currently Present Specify Current Suicidal Plan: Cut wrist Access to Means: Yes Specify Access to Suicidal Means: access to sharp objects What has been your use of drugs/alcohol within the last 12 months?: None Reported How many times?: 1 Other Self Harm Risks: None Reported Triggers for Past Attempts: None known Intentional Self Injurious Behavior: None Risk to Others: Homicidal Ideation: No Thoughts of Harm to Others: No Current Homicidal Intent: No Current Homicidal Plan: No Access to Homicidal Means: No Identified Victim: None Reported History of harm to others?: No Assessment of Violence: None Noted Violent Behavior Description: None Reported Does patient have access to weapons?: No Criminal Charges Pending?: No Does patient have a court date: No Prior Inpatient Therapy: Prior Inpatient Therapy: Yes Prior Therapy Dates: Multiple Hospitalizations, most recent 03/2018 Prior Therapy Facilty/Provider(s): [facility] Reason for Treatment: Depression Prior Outpatient Therapy: Prior Outpatient Therapy: Yes Prior Therapy Dates: current Prior Therapy Facilty/Provider(s): [facility] Reason for Treatment: depression Does patient have an ACCT team?: No Does patient have Intensive In-House  Services?  : No Does patient have [facility] services? : No Does patient have P4CC services?: No  Past Medical History:  Past Medical History:  Diagnosis Date  .  Anxiety   . Anxiety   . Arthritis    cerv. stenosis, spondylosis, HNP- lower back , has been followed in pain clinic, has  had injection s in cerv. area  . Blood dyscrasia    told that when he was young he was a" free bleeder"  . CAD (coronary artery disease)   . Cervical spondylosis without myelopathy 07/24/2014  . Cervicogenic headache 07/24/2014  . Chronic kidney disease    renal calculi- passed spontaneously  . Depression   . Diabetes mellitus without complication ([place])   . Fatty liver   . GERD (gastroesophageal reflux disease)   . Headache(784.0)   . Hyperlipidemia   . Hypertension   . Mental disorder   . MI, old   . RLS (restless legs syndrome)    detected on sleep study  . Shortness of breath   . Ventricular fibrillation ([place]) 04/05/2016    Past Surgical History:  Procedure Laterality Date  . ANTERIOR CERVICAL DECOMP/DISCECTOMY FUSION  11/13/2011   Procedure: ANTERIOR CERVICAL DECOMPRESSION/DISCECTOMY FUSION 1 LEVEL;  Surgeon: [name], MD;  Location: [facility];  Service: Neurosurgery;  Laterality: N/A;  Anterior Cervical Decompression/discectomy Fusion. Cervical three-four.  Marland Kitchen CARDIAC CATHETERIZATION N/A 01/01/2015   Procedure: Left Heart Cath and Coronary Angiography;  Surgeon: [name], MD;  Location: [facility];  Service: Cardiovascular;  Laterality: N/A;  . CARDIAC CATHETERIZATION N/A 04/05/2016   Procedure: Left Heart Cath and Coronary Angiography;  Surgeon: Belva Crome, MD;  Location: [name];  Service: Cardiovascular;  Laterality: N/A;  . CARDIAC CATHETERIZATION N/A 04/05/2016   Procedure: Coronary Stent Intervention;  Surgeon: Belva Crome, MD;  Location: [facility];  Service: Cardiovascular;  Laterality: N/A;  . CARDIAC CATHETERIZATION N/A 04/05/2016   Procedure: Intravascular Ultrasound/IVUS;  Surgeon: Belva Crome, MD;  Location: [facility];  Service: Cardiovascular;  Laterality: N/A;  . NASAL SINUS SURGERY     2005    Family History:  Family History  Problem Relation Age of Onset  . Prostate cancer Father   . Hypertension Mother   . Kidney Stones Mother   . Anxiety disorder Mother   . Depression Mother   . COPD Sister   . Hypertension Sister   . Diabetes Sister   . Depression Sister   . Anxiety disorder Sister   . Seizures Sister   . ADD / ADHD Son   . ADD / ADHD Daughter    Family Psychiatric  History: Not given Social History:  Social History   Substance and Sexual Activity  Alcohol Use No  . Alcohol/week: 0.0 standard drinks   Comment: last drinked in 3 months.      Social History   Substance and Sexual Activity  Drug Use No    Social History   Socioeconomic History  . Marital status: Single    Spouse name: Not on file  . Number of children: 3  . Years of education: 66  . Highest education level: Not on file  Occupational History    Comment: unemployed  Social Needs  . Financial resource strain: Not on file  . Food insecurity:    Worry: Not on file    Inability: Not on file  . Transportation needs:    Medical: Not on file  Non-medical: Not on file  Tobacco Use  . Smoking status: Current Every Day Smoker    Packs/day: 2.00    Years: 17.00    Pack years: 34.00    Types: Cigarettes, Cigars  . Smokeless tobacco: Former Systems developer  . Tobacco comment: occassional snuff   Substance and Sexual Activity  . Alcohol use: No    Alcohol/week: 0.0 standard drinks    Comment: last drinked in 3 months.   . Drug use: No  . Sexual activity: Not Currently  Lifestyle  . Physical activity:    Days per week: Not on file    Minutes per session: Not on file  . Stress: Not on file  Relationships  . Social connections:    Talks on phone: Not on file    Gets together: Not on file    Attends religious service: Not on file    Active member of club or organization: Not on file    Attends meetings of clubs or organizations: Not on file    Relationship status: Not on file  Other  Topics Concern  . Not on file  Social History Narrative   Patient is right handed.   Patient drinks 2 sodas daily.   Additional Social History:    Allergies:   Allergies  Allergen Reactions  . Prednisone Other (See Comments)    Hypertension and makes him feel "spacey"  . Hydrocodone Other (See Comments)    Causes headaches and makes the patient irritable  . Trazodone And Nefazodone Other (See Comments)    Patient stated that this keeps him awake; "has an opposite effect on me"  . Varenicline Other (See Comments)    Suicidal thoughts    Labs:  Results for orders placed or performed during the hospital encounter of 05/07/18 (from the past 48 hour(s))  Comprehensive metabolic panel     Status: Abnormal   Collection Time: 05/07/18 10:39 AM  Result Value Ref Range   Sodium 134 (L) 135 - 145 mmol/L   Potassium 3.7 3.5 - 5.1 mmol/L   Chloride 102 98 - 111 mmol/L   CO2 19 (L) 22 - 32 mmol/L   Glucose, Bld 219 (H) 70 - 99 mg/dL   BUN 11 6 - 20 mg/dL   Creatinine, Ser 1.13 0.61 - 1.24 mg/dL   Calcium 9.0 8.9 - 10.3 mg/dL   Total Protein 7.8 6.5 - 8.1 g/dL   Albumin 4.5 3.5 - 5.0 g/dL   AST 36 15 - 41 U/L   ALT 38 0 - 44 U/L   Alkaline Phosphatase 105 38 - 126 U/L   Total Bilirubin 0.6 0.3 - 1.2 mg/dL   GFR calc non Af Amer >60 >60 mL/min   GFR calc Af Amer >60 >60 mL/min   Anion gap 13 5 - 15    Comment: Performed at [facility], [address] [place], [place] [address]  Ethanol     Status: None   Collection Time: 05/07/18 10:39 AM  Result Value Ref Range   Alcohol, Ethyl (B) <10 <10 mg/dL    Comment: (NOTE) Lowest detectable limit for serum alcohol is 10 mg/dL. For medical purposes only. Performed at [facility], [address] [place], [place] [address]   Salicylate level     Status: None   Collection Time: 05/07/18 10:39 AM  Result Value Ref Range   Salicylate Lvl <5.0 2.8 - 30.0 mg/dL    Comment: Performed at [facility], [address],  [place], [place] [address]  Acetaminophen level     Status: Abnormal   Collection Time: 05/07/18 10:39 AM  Result Value Ref Range   Acetaminophen (Tylenol), Serum <10 (L) 10 - 30 ug/mL    Comment: (NOTE) Therapeutic concentrations vary significantly. A range of 10-30 ug/mL  may be an effective concentration for many patients. However, some  are best treated at concentrations outside of this range. Acetaminophen concentrations >150 ug/mL at 4 hours after ingestion  and >50 ug/mL at 12 hours after ingestion are often associated with  toxic reactions. Performed at [facility], [address] [place], [place] [address]   cbc     Status: Abnormal   Collection Time: 05/07/18 10:39 AM  Result Value Ref Range   WBC 11.8 (H) 4.0 - 10.5 K/uL   RBC 5.04 4.22 - 5.81 MIL/uL   Hemoglobin 15.9 13.0 - 17.0 g/dL   HCT 46.2 39.0 - 52.0 %   MCV 91.7 80.0 - 100.0 fL   MCH 31.5 26.0 - 34.0 pg   MCHC 34.4 30.0 - 36.0 g/dL   RDW 15.2 11.5 - 15.5 %   Platelets 358 150 - 400 K/uL   nRBC 0.3 (H) 0.0 - 0.2 %    Comment: Performed at [facility], [address] [place], [place] [address]  Valproic acid level     Status: Abnormal   Collection Time: 05/07/18 10:39 AM  Result Value Ref Range   Valproic Acid Lvl <10 (L) 50.0 - 100.0 ug/mL    Comment: Performed at [facility], [address] [place], [place] [address]  Urine Drug Screen, Qualitative     Status: Abnormal   Collection Time: 05/07/18 10:50 AM  Result Value Ref Range   Tricyclic, Ur Screen POSITIVE (A) NONE DETECTED   Amphetamines, Ur Screen NONE DETECTED NONE DETECTED   MDMA (Ecstasy)Ur Screen NONE DETECTED NONE DETECTED   Cocaine Metabolite,Ur [place] NONE DETECTED NONE DETECTED   Opiate, Ur Screen NONE DETECTED NONE DETECTED   Phencyclidine (PCP) Ur S NONE DETECTED NONE DETECTED   Cannabinoid 50 Ng, Ur [place] NONE DETECTED NONE DETECTED   Barbiturates, Ur Screen NONE DETECTED NONE DETECTED    Benzodiazepine, Ur Scrn NONE DETECTED NONE DETECTED   Methadone Scn, Ur NONE DETECTED NONE DETECTED    Comment: (NOTE) Tricyclics + metabolites, urine    Cutoff 1000 ng/mL Amphetamines + metabolites, urine  Cutoff 1000 ng/mL MDMA (Ecstasy), urine              Cutoff 500 ng/mL Cocaine Metabolite, urine          Cutoff 300 ng/mL Opiate + metabolites, urine        Cutoff 300 ng/mL Phencyclidine (PCP), urine         Cutoff 25 ng/mL Cannabinoid, urine                 Cutoff 50 ng/mL Barbiturates + metabolites, urine  Cutoff 200 ng/mL Benzodiazepine, urine              Cutoff 200 ng/mL Methadone, urine                   Cutoff 300 ng/mL The urine drug screen provides only a preliminary, unconfirmed analytical test result and should not be used for non-medical purposes. Clinical consideration and professional judgment should be applied to any positive drug screen result due to possible interfering substances. A more specific alternate chemical method must be used in order to obtain a  confirmed analytical result. Gas chromatography / mass spectrometry (GC/MS) is the preferred confirmat ory method. Performed at [facility], [address] [place], [place] [address]     Current Facility-Administered Medications  Medication Dose Route Frequency Provider Last Rate Last Dose  . aspirin EC tablet 81 mg  81 mg Oral Daily Eula Listen, MD   81 mg at 05/07/18 1326  . atorvastatin (LIPITOR) tablet 80 mg  80 mg Oral q1800 Eula Listen, MD   80 mg at 05/07/18 1826  . carvedilol (COREG) tablet 6.25 mg  6.25 mg Oral BID WC Eula Listen, MD   6.25 mg at 05/07/18 1826  . cloNIDine (CATAPRES) tablet 0.1 mg  0.1 mg Oral TID Lavella Hammock, MD   0.1 mg at 05/07/18 1826  . diphenhydrAMINE (BENADRYL) capsule 50 mg  50 mg Oral Q6H PRN Lavella Hammock, MD       Or  . diphenhydrAMINE (BENADRYL) injection 50 mg  50 mg Intramuscular Q6H PRN Lavella Hammock, MD      .  divalproex (DEPAKOTE ER) 24 hr tablet 1,500 mg  1,500 mg Oral QHS Lavella Hammock, MD       Followed by  . [START ON 05/08/2018] divalproex (DEPAKOTE ER) 24 hr tablet 1,000 mg  1,000 mg Oral QHS Lavella Hammock, MD      . fluPHENAZine (PROLIXIN) injection 2.5 mg  2.5 mg Intramuscular Q6H PRN Lavella Hammock, MD      . fluPHENAZine (PROLIXIN) tablet 5 mg  5 mg Oral BID Lavella Hammock, MD   5 mg at 05/07/18 1903  . fluvoxaMINE (LUVOX) tablet 50 mg  50 mg Oral QHS Lavella Hammock, MD      . LORazepam (ATIVAN) tablet 1 mg  1 mg Oral Q6H PRN Lavella Hammock, MD       Or  . LORazepam (ATIVAN) injection 2 mg  2 mg Intramuscular Q6H PRN Lavella Hammock, MD      . metFORMIN (GLUCOPHAGE) tablet 500 mg  500 mg Oral BID WC Eula Listen, MD   500 mg at 05/07/18 1826   Current Outpatient Medications  Medication Sig Dispense Refill  . aspirin 81 MG EC tablet Take 1 tablet (81 mg total) by mouth daily. For heart health 30 tablet 1  . atorvastatin (LIPITOR) 80 MG tablet Take 1 tablet (80 mg total) by mouth daily at 6 PM. For high cholesterol (Patient taking differently: Take 80 mg by mouth daily. For high cholesterol) 30 tablet 0  . carvedilol (COREG) 6.25 MG tablet Take 1 tablet (6.25 mg total) by mouth 2 (two) times daily with a meal. For high blood pressure 60 tablet 0  . gabapentin (NEURONTIN) 300 MG capsule Take 3 capsules (900 mg total) by mouth 3 (three) times daily. 270 capsule 0  . ibuprofen (ADVIL,MOTRIN) 800 MG tablet Take 2,400 mg by mouth 3 (three) times daily as needed.    . lamoTRIgine (LAMICTAL) 100 MG tablet Take 1 tablet (100 mg total) by mouth daily. 30 tablet 0  . metFORMIN (GLUCOPHAGE) 500 MG tablet Take 1 tablet (500 mg total) by mouth 2 (two) times daily with a meal. For diabetes management    . nitroGLYCERIN (NITROSTAT) 0.4 MG SL tablet Place 1 tablet (0.4 mg total) under the tongue every 5 (five) minutes x 3 doses as needed. For chest pain 10 tablet 0  . QUEtiapine  (SEROQUEL) 200 MG tablet Take 1 tablet (200 mg total) by mouth daily.  30 tablet 0  . QUEtiapine (SEROQUEL) 300 MG tablet Take 2 tablets (600 mg total) by mouth at bedtime. 60 tablet 0  . sertraline (ZOLOFT) 100 MG tablet Take 100 mg by mouth at bedtime.      Musculoskeletal: Strength & Muscle Tone: within normal limits Gait & Station: normal Patient leans: N/A  Psychiatric Specialty Exam: Physical Exam  Vitals reviewed. Constitutional: He is oriented to person, place, and time. He appears well-developed and well-nourished.  Eyes: EOM are normal.  Cardiovascular: Normal rate.  Respiratory: Effort normal.  Musculoskeletal: Normal range of motion.  Neurological: He is alert and oriented to person, place, and time. He has normal reflexes.  Skin: Skin is warm and dry.    Review of Systems  Constitutional: Negative.   HENT: Negative.   Eyes: Negative.   Respiratory: Negative.   Cardiovascular: Negative.   Gastrointestinal: Negative.   Genitourinary: Negative.   Musculoskeletal: Positive for neck pain.  Skin: Negative.   Neurological: Negative.   Endo/Heme/Allergies: Negative.   Psychiatric/Behavioral: Positive for depression and suicidal ideas. Negative for hallucinations and substance abuse. The patient is nervous/anxious. The patient does not have insomnia.   All other systems reviewed and are negative.   Blood pressure 130/85, pulse (!) 109, temperature 98 F (36.7 C), temperature source Oral, resp. rate 18, height '6\' 2"'  (1.88 m), weight 96 kg, SpO2 97 %.Body mass index is 27.17 kg/m.  General Appearance: Fairly Groomed  Eye Contact:  Fair  Speech:  Garbled  Volume:  Increased  Mood:  Anxious  Affect:  Full Range  Thought Process:  Coherent  Orientation:  Full (Time, Place, and Person)  Thought Content:  Obsessions  Suicidal Thoughts:  Yes.  with intent/plan  Homicidal Thoughts:  Yes.  with intent/plan  Memory:  Immediate;   Fair  Judgement:  Impaired  Insight:   Lacking  Psychomotor Activity:  Decreased  Concentration:  Concentration: Fair  Recall:  [employer] of Knowledge:  Fair  Language:  Good  Akathisia:  Negative  Handed:  Right  AIMS (if indicated):     Assets:  Social Support  ADL's:  Intact  Cognition:  WNL  Sleep:   Insomnia     Treatment Plan Summary: Medication management    Disposition: Patient does not meet criteria for psychiatric inpatient admission. Supportive therapy provided about ongoing stressors. Discussed crisis plan, support from social network, calling 911, coming to the Emergency Department, and calling Suicide Hotline.  Patient will be observed overnight and will be re-assess in the morning  Lamont Dowdy, NP 05/07/2018 7:15 PM

## 2018-05-07 NOTE — ED Notes (Signed)
Pt told this Probation officer, "I just want to be happy for my kids."   Pt asking about what medications he will be receiving tonight. RN told patient she would check with the psychiatrist and let him know. Pt accepting and returned to his room. Maintained on 15 minute checks and observation by security camera for safety.

## 2018-05-07 NOTE — ED Notes (Signed)
Given lunch tray.

## 2018-05-07 NOTE — ED Notes (Signed)
Report to Amy, RN

## 2018-05-07 NOTE — ED Notes (Signed)
Pt ambulated to [facility] with Linus Orn, ED Tech and ODS officer. Belongings sent with ED Tech 1 bag.

## 2018-05-07 NOTE — ED Notes (Signed)
IV[facility]/ Consult pending

## 2018-05-07 NOTE — ED Notes (Signed)
Pt calm and cooperative with this RN.  Complaint with medications. Maintained on 15 minute checks and observation by security camera for safety.

## 2018-05-07 NOTE — ED Notes (Signed)
Report to include Situation, Background, Assessment, and Recommendations received from Amy RN. Patient alert and oriented, warm and dry, in no acute distress. Patient denies SI, HI, AVH and pain. Patient made aware of Q15 minute rounds and Rover and Officer presence for their safety. Patient instructed to come to me with needs or concerns.   

## 2018-05-07 NOTE — ED Notes (Signed)
IVC PENDING  CONSULT ?

## 2018-05-07 NOTE — ED Notes (Signed)
Pt  Moved  To  [employer]

## 2018-05-08 ENCOUNTER — Other Ambulatory Visit: Payer: Self-pay | Admitting: Psychiatry

## 2018-05-08 DIAGNOSIS — F250 Schizoaffective disorder, bipolar type: Secondary | ICD-10-CM

## 2018-05-08 MED ORDER — FLUPHENAZINE HCL 5 MG PO TABS: 5.0000 mg | ORAL_TABLET | ORAL | Status: DC

## 2018-05-08 MED ORDER — ROPINIROLE HCL 0.25 MG PO TABS: 0.2500 mg | ORAL_TABLET | Freq: Every day | ORAL | 1 refills | Status: DC

## 2018-05-08 MED ORDER — DIVALPROEX SODIUM ER 500 MG PO TB24: 1500.0000 mg | ORAL_TABLET | Freq: Every day | ORAL | Status: DC

## 2018-05-08 MED ORDER — FLUPHENAZINE HCL 5 MG PO TABS: ORAL_TABLET | ORAL | 0 refills | Status: DC

## 2018-05-08 MED ORDER — FLUVOXAMINE MALEATE 50 MG PO TABS: 50.0000 mg | ORAL_TABLET | Freq: Every day | ORAL | 0 refills | Status: DC

## 2018-05-08 MED ORDER — FLUPHENAZINE HCL 5 MG PO TABS
10.0000 mg | ORAL_TABLET | Freq: Every day | ORAL | Status: DC
  Filled 2018-05-08: qty 2

## 2018-05-08 MED ORDER — DIVALPROEX SODIUM ER 500 MG PO TB24: 1500.0000 mg | ORAL_TABLET | Freq: Every day | ORAL | 0 refills | Status: DC

## 2018-05-08 MED ORDER — CLONIDINE HCL 0.1 MG PO TABS: 0.1000 mg | ORAL_TABLET | Freq: Three times a day (TID) | ORAL | 0 refills | Status: DC

## 2018-05-08 MED ORDER — DIPHENHYDRAMINE HCL 25 MG PO CAPS: 50.0000 mg | ORAL_CAPSULE | Freq: Every day | ORAL | Status: DC

## 2018-05-08 MED ORDER — DIPHENHYDRAMINE HCL 25 MG PO TABS: 50.0000 mg | ORAL_TABLET | Freq: Every evening | ORAL | 0 refills | Status: DC | PRN

## 2018-05-08 NOTE — ED Notes (Signed)
Pt given cup of juice.

## 2018-05-08 NOTE — ED Notes (Signed)
Hourly rounding reveals patient in room. No complaints, stable, in no acute distress. Q15 minute rounds and monitoring via Security Cameras to continue. 

## 2018-05-08 NOTE — ED Notes (Signed)
Patient c/o not getting enough sleep. He requested for his Seroquel. This Probation officer informed the patient that the medication is not on the [facility]. This Probation officer administered 50 mg of Benadryl PRN for sleep per order. Patient tolerated well. No further issues.

## 2018-05-08 NOTE — ED Provider Notes (Addendum)
-----------------------------------------   10:28 AM on 05/08/2018 -----------------------------------------   Blood pressure 111/67, pulse 94, temperature (!) 97.5 F (36.4 C), temperature source Oral, resp. rate 20, height 6\' 2"  (1.88 m), weight 96 kg, SpO2 98 %.  The patient is calm and cooperative at this time.  There have been no acute events since the last update.  Awaiting disposition plan from Behavioral Medicine team.    Harvest Dark, MD 05/08/18 1028   ----------------------------------------- 11:06 AM on 05/08/2018 -----------------------------------------  Patient has been seen and evaluated by psychiatry.  They believe the patient is safe for discharge home.  They have made multiple medication changes, I will prescribe these medications for the patient until he can be reestablished with [facility] 05/15/2018.  Medication changes as recommended by psychiatry,  I will provide 30-day prescriptions for these medications.      Harvest Dark, MD 05/08/18 1106    Harvest Dark, MD 05/08/18 [phone]

## 2018-05-08 NOTE — ED Notes (Signed)
BEHAVIORAL HEALTH ROUNDING Patient sleeping: No. Patient alert and oriented: yes Behavior appropriate: Yes.  ; If no, describe:  Nutrition and fluids offered: yes Toileting and hygiene offered: Yes  Sitter present: q15 minute observations and security monitoring Law enforcement present: Yes    

## 2018-05-08 NOTE — Consult Note (Signed)
[facility] Consult   Reason for Consult:  Suicidal Ideation Referring Physician:  Dr. Mariea Clonts Patient Identification: Michael Schmitt MRN:  [id number] Principal Diagnosis: Suicidal Ideation Diagnosis:  Suicidal Ideation  Total Time spent with patient: 70 min  Subjective: "I'm ready to go home.  I did not get much sleep last night but I feel better about these medicines."  Michael Schmitt is a 48 y.o. male patient who was seen in the ED for a Psychiatric consult due to him voicing that he wants to kill himself. Patient is alert and oriented x 4. During patient encounter he voiced "what do I have to say to get me to be admitted?" Patient was calm and cooperative. Patient was exhibiting some anxiety due to him asking about what medication is he going to get? The patient does not appear to be responding to internal, external stimuli or delusional thoughts. The patient does admit to having suicidal ideation with a plan. He also admits to having homicidal ideations. He states if anyone bothers him he will hurt them.  Dr. Leverne Humbles met with the patient and made some changes to his medications. Clonidine 0.1 mg three tim es a day, Prolixin 5 mg Bid, Luvox 50 mg at hs, Depakote 1500 mg at bedtime x1 and Depakote 1000 mg at bedtime on 05/08/18. The patient will be observe overnight and will be assess in the morning.  HPI:  Per Dr. Mariea Clonts; Michael Schmitt is a 48 y.o. male with a history of major depressive disorder with psychotic features, generalized anxiety, personality disorder, and multiple chronic medical comorbidities, presenting for suicidal ideations.  The patient has a history of chronic suicidality and states that he wants to kill himself.  Upon arrival to the emergency department he is having loud aggressive outbursts.  He states he would like to be admitted to a long-term care facility.  He has not had any acute medical complaints.  Collaboration: Encounter with the patient  sister (Michael Schmitt, [phone]). The sister states he is "very suicidal, depressed and wants to die." The sister states he needs to be in a long-term facility. The sister also voiced that he can not returned her home. The sister states her brother is very destructive and she can not have him her home due to his violent outburst, Past Psychiatric History:  MDD severe Anxiety d/o Suicidal Ideation  Patient is seen.  Nurses report that patient has had no behavioral issues, and no outburst. On evaluation today, patient appears calmer, despite stating he had poor sleep.  Review of nursing records reveals that patient did not get his evening dose of Prolixin as had been ordered, due to his first dose being given after 7 PM.  Patient describes other symptoms of mood and anxiety to be improved.  He is currently denying suicidal and homicidal ideation.  He is denying auditory and visual hallucinations.  Patient is able to contract for safety, and his mother and his sister with whom he lives are called for a family meeting to confirm that patient has a safe discharge plan. A list of medications is provided for the patient due to the changes that were made, as well as the rationale, and follow-up required.  Patient does have an appointment within 5 days with his outpatient psychiatrist at [facility].  Time was provided for patient and family members to ask questions.  Family meeting is held for approximately 40 minutes.  At the end of meeting, patient, family are agreeable to plan  of care.  They state understanding of the medications, and agreed to patient follow-up.  Patient also will engage in psychotherapy, which she has done previously (however has had difficulty with co-pays).   Risk to Self: Suicidal Ideation: Yes-Currently Present Suicidal Intent: Yes-Currently Present Is patient at risk for suicide?: Yes Suicidal Plan?: Yes-Currently Present Specify Current Suicidal Plan: Cut wrist Access to Means:  Yes Specify Access to Suicidal Means: access to sharp objects What has been your use of drugs/alcohol within the last 12 months?: None Reported How many times?: 1 Other Self Harm Risks: None Reported Triggers for Past Attempts: None known Intentional Self Injurious Behavior: None Risk to Others: Homicidal Ideation: No Thoughts of Harm to Others: No Current Homicidal Intent: No Current Homicidal Plan: No Access to Homicidal Means: No Identified Victim: None Reported History of harm to others?: No Assessment of Violence: None Noted Violent Behavior Description: None Reported Does patient have access to weapons?: No Criminal Charges Pending?: No Does patient have a court date: No Prior Inpatient Therapy: Prior Inpatient Therapy: Yes Prior Therapy Dates: Multiple Hospitalizations, most recent 03/2018 Prior Therapy Facilty/Provider(s): [facility] Reason for Treatment: Depression Prior Outpatient Therapy: Prior Outpatient Therapy: Yes Prior Therapy Dates: current Prior Therapy Facilty/Provider(s): [facility] Reason for Treatment: depression Does patient have an ACCT team?: No Does patient have Intensive In-House Services?  : No Does patient have [facility] services? : No Does patient have P4CC services?: No  Past Medical History:  Past Medical History:  Diagnosis Date  . Anxiety   . Anxiety   . Arthritis    cerv. stenosis, spondylosis, HNP- lower back , has been followed in pain clinic, has  had injection s in cerv. area  . Blood dyscrasia    told that when he was young he was a" free bleeder"  . CAD (coronary artery disease)   . Cervical spondylosis without myelopathy 07/24/2014  . Cervicogenic headache 07/24/2014  . Chronic kidney disease    renal calculi- passed spontaneously  . Depression   . Diabetes mellitus without complication ([place])   . Fatty liver   . GERD (gastroesophageal reflux disease)   . Headache(784.0)   . Hyperlipidemia   . Hypertension   . Mental  disorder   . MI, old   . RLS (restless legs syndrome)    detected on sleep study  . Shortness of breath   . Ventricular fibrillation ([place]) 04/05/2016    Past Surgical History:  Procedure Laterality Date  . ANTERIOR CERVICAL DECOMP/DISCECTOMY FUSION  11/13/2011   Procedure: ANTERIOR CERVICAL DECOMPRESSION/DISCECTOMY FUSION 1 LEVEL;  Surgeon: [name], MD;  Location: [facility];  Service: Neurosurgery;  Laterality: N/A;  Anterior Cervical Decompression/discectomy Fusion. Cervical three-four.  Marland Kitchen CARDIAC CATHETERIZATION N/A 01/01/2015   Procedure: Left Heart Cath and Coronary Angiography;  Surgeon: [name], MD;  Location: [facility];  Service: Cardiovascular;  Laterality: N/A;  . CARDIAC CATHETERIZATION N/A 04/05/2016   Procedure: Left Heart Cath and Coronary Angiography;  Surgeon: Belva Crome, MD;  Location: [facility];  Service: Cardiovascular;  Laterality: N/A;  . CARDIAC CATHETERIZATION N/A 04/05/2016   Procedure: Coronary Stent Intervention;  Surgeon: Belva Crome, MD;  Location: [facility];  Service: Cardiovascular;  Laterality: N/A;  . CARDIAC CATHETERIZATION N/A 04/05/2016   Procedure: Intravascular Ultrasound/IVUS;  Surgeon: Belva Crome, MD;  Location: [facility];  Service: Cardiovascular;  Laterality: N/A;  . NASAL SINUS SURGERY  2005   Family History:  Family History  Problem Relation Age of Onset  . Prostate cancer Father   . Hypertension Mother   . Kidney Stones Mother   . Anxiety disorder Mother   . Depression Mother   . COPD Sister   . Hypertension Sister   . Diabetes Sister   . Depression Sister   . Anxiety disorder Sister   . Seizures Sister   . ADD / ADHD Son   . ADD / ADHD Daughter    Family Psychiatric  History: Not given Social History:  Social History   Substance and Sexual Activity  Alcohol Use No  . Alcohol/week: 0.0 standard drinks   Comment: last drinked in 3 months.      Social History   Substance  and Sexual Activity  Drug Use No    Social History   Socioeconomic History  . Marital status: Single    Spouse name: Not on file  . Number of children: 3  . Years of education: 63  . Highest education level: Not on file  Occupational History    Comment: unemployed  Social Needs  . Financial resource strain: Not on file  . Food insecurity:    Worry: Not on file    Inability: Not on file  . Transportation needs:    Medical: Not on file    Non-medical: Not on file  Tobacco Use  . Smoking status: Current Every Day Smoker    Packs/day: 2.00    Years: 17.00    Pack years: 34.00    Types: Cigarettes, Cigars  . Smokeless tobacco: Former Systems developer  . Tobacco comment: occassional snuff   Substance and Sexual Activity  . Alcohol use: No    Alcohol/week: 0.0 standard drinks    Comment: last drinked in 3 months.   . Drug use: No  . Sexual activity: Not Currently  Lifestyle  . Physical activity:    Days per week: Not on file    Minutes per session: Not on file  . Stress: Not on file  Relationships  . Social connections:    Talks on phone: Not on file    Gets together: Not on file    Attends religious service: Not on file    Active member of club or organization: Not on file    Attends meetings of clubs or organizations: Not on file    Relationship status: Not on file  Other Topics Concern  . Not on file  Social History Narrative   Patient is right handed.   Patient drinks 2 sodas daily.   Additional Social History:    Allergies:   Allergies  Allergen Reactions  . Prednisone Other (See Comments)    Hypertension and makes him feel "spacey"  . Hydrocodone Other (See Comments)    Causes headaches and makes the patient irritable  . Trazodone And Nefazodone Other (See Comments)    Patient stated that this keeps him awake; "has an opposite effect on me"  . Varenicline Other (See Comments)    Suicidal thoughts    Labs:  Results for orders placed or performed during the  hospital encounter of 05/07/18 (from the past 48 hour(s))  Comprehensive metabolic panel     Status: Abnormal   Collection Time: 05/07/18 10:39 AM  Result Value Ref Range   Sodium 134 (L) 135 - 145 mmol/L   Potassium 3.7 3.5 - 5.1 mmol/L   Chloride 102 98 - 111 mmol/L   CO2 19 (L) 22 -  32 mmol/L   Glucose, Bld 219 (H) 70 - 99 mg/dL   BUN 11 6 - 20 mg/dL   Creatinine, Ser 1.13 0.61 - 1.24 mg/dL   Calcium 9.0 8.9 - 10.3 mg/dL   Total Protein 7.8 6.5 - 8.1 g/dL   Albumin 4.5 3.5 - 5.0 g/dL   AST 36 15 - 41 U/L   ALT 38 0 - 44 U/L   Alkaline Phosphatase 105 38 - 126 U/L   Total Bilirubin 0.6 0.3 - 1.2 mg/dL   GFR calc non Af Amer >60 >60 mL/min   GFR calc Af Amer >60 >60 mL/min   Anion gap 13 5 - 15    Comment: Performed at [facility], [address] [place], [place] [address]  Ethanol     Status: None   Collection Time: 05/07/18 10:39 AM  Result Value Ref Range   Alcohol, Ethyl (B) <10 <10 mg/dL    Comment: (NOTE) Lowest detectable limit for serum alcohol is 10 mg/dL. For medical purposes only. Performed at [facility], [address] [place], [place] [address]   Salicylate level     Status: None   Collection Time: 05/07/18 10:39 AM  Result Value Ref Range   Salicylate Lvl <0.2 2.8 - 30.0 mg/dL    Comment: Performed at [facility], [address] [place], [place] [address]  Acetaminophen level     Status: Abnormal   Collection Time: 05/07/18 10:39 AM  Result Value Ref Range   Acetaminophen (Tylenol), Serum <10 (L) 10 - 30 ug/mL    Comment: (NOTE) Therapeutic concentrations vary significantly. A range of 10-30 ug/mL  may be an effective concentration for many patients. However, some  are best treated at concentrations outside of this range. Acetaminophen concentrations >150 ug/mL at 4 hours after ingestion  and >50 ug/mL at 12 hours after ingestion are often associated with  toxic reactions. Performed at [facility], [address] [place], [place] [address]   cbc     Status: Abnormal   Collection Time: 05/07/18 10:39 AM  Result Value Ref Range   WBC 11.8 (H) 4.0 - 10.5 K/uL   RBC 5.04 4.22 - 5.81 MIL/uL   Hemoglobin 15.9 13.0 - 17.0 g/dL   HCT 46.2 39.0 - 52.0 %   MCV 91.7 80.0 - 100.0 fL   MCH 31.5 26.0 - 34.0 pg   MCHC 34.4 30.0 - 36.0 g/dL   RDW 15.2 11.5 - 15.5 %   Platelets 358 150 - 400 K/uL   nRBC 0.3 (H) 0.0 - 0.2 %    Comment: Performed at [facility], [address] [place], [place] [address]  Valproic acid level     Status: Abnormal   Collection Time: 05/07/18 10:39 AM  Result Value Ref Range   Valproic Acid Lvl <10 (L) 50.0 - 100.0 ug/mL    Comment: Performed at [facility], [address] [place], [place] [address]  Urine Drug Screen, Qualitative     Status: Abnormal   Collection Time: 05/07/18 10:50 AM  Result Value Ref Range   Tricyclic, Ur Screen POSITIVE (A) NONE DETECTED   Amphetamines, Ur Screen NONE DETECTED NONE DETECTED   MDMA (Ecstasy)Ur Screen NONE DETECTED NONE DETECTED   Cocaine Metabolite,Ur [place] NONE DETECTED NONE DETECTED   Opiate, Ur Screen NONE DETECTED NONE DETECTED   Phencyclidine (PCP) Ur S NONE DETECTED NONE DETECTED   Cannabinoid 50 Ng, Ur [place] NONE DETECTED NONE DETECTED  Barbiturates, Ur Screen NONE DETECTED NONE DETECTED   Benzodiazepine, Ur Scrn NONE DETECTED NONE DETECTED   Methadone Scn, Ur NONE DETECTED NONE DETECTED    Comment: (NOTE) Tricyclics + metabolites, urine    Cutoff 1000 ng/mL Amphetamines + metabolites, urine  Cutoff 1000 ng/mL MDMA (Ecstasy), urine              Cutoff 500 ng/mL Cocaine Metabolite, urine          Cutoff 300 ng/mL Opiate + metabolites, urine        Cutoff 300 ng/mL Phencyclidine (PCP), urine         Cutoff 25 ng/mL Cannabinoid, urine                 Cutoff 50 ng/mL Barbiturates + metabolites, urine  Cutoff 200 ng/mL Benzodiazepine, urine              Cutoff 200 ng/mL Methadone, urine                    Cutoff 300 ng/mL The urine drug screen provides only a preliminary, unconfirmed analytical test result and should not be used for non-medical purposes. Clinical consideration and professional judgment should be applied to any positive drug screen result due to possible interfering substances. A more specific alternate chemical method must be used in order to obtain a confirmed analytical result. Gas chromatography / mass spectrometry (GC/MS) is the preferred confirmat ory method. Performed at [facility], [address] [place], [place] [address]     Current Facility-Administered Medications  Medication Dose Route Frequency Provider Last Rate Last Dose  . aspirin EC tablet 81 mg  81 mg Oral Daily Eula Listen, MD   81 mg at 05/08/18 1012  . atorvastatin (LIPITOR) tablet 80 mg  80 mg Oral q1800 Eula Listen, MD   80 mg at 05/07/18 1826  . carvedilol (COREG) tablet 6.25 mg  6.25 mg Oral BID WC Eula Listen, MD   6.25 mg at 05/08/18 0900  . cloNIDine (CATAPRES) tablet 0.1 mg  0.1 mg Oral TID Lavella Hammock, MD   0.1 mg at 05/08/18 1012  . diphenhydrAMINE (BENADRYL) capsule 50 mg  50 mg Oral Q6H PRN Lavella Hammock, MD   50 mg at 05/08/18 2482   Or  . diphenhydrAMINE (BENADRYL) injection 50 mg  50 mg Intramuscular Q6H PRN Lavella Hammock, MD      . divalproex (DEPAKOTE ER) 24 hr tablet 1,500 mg  1,500 mg Oral QHS Lavella Hammock, MD      . fluPHENAZine (PROLIXIN) injection 2.5 mg  2.5 mg Intramuscular Q6H PRN Lavella Hammock, MD      . fluPHENAZine (PROLIXIN) tablet 5 mg  5 mg Oral BID Lavella Hammock, MD   5 mg at 05/08/18 1012  . fluvoxaMINE (LUVOX) tablet 50 mg  50 mg Oral QHS Lavella Hammock, MD      . LORazepam (ATIVAN) tablet 1 mg  1 mg Oral Q6H PRN Lavella Hammock, MD       Or  . LORazepam (ATIVAN) injection 2 mg  2 mg Intramuscular Q6H PRN Lavella Hammock, MD      . metFORMIN (GLUCOPHAGE) tablet 500 mg  500 mg Oral BID WC  Eula Listen, MD   500 mg at 05/08/18 0800   Current Outpatient Medications  Medication Sig Dispense Refill  . aspirin 81 MG EC tablet Take 1 tablet (81 mg total) by mouth daily. For heart  health 30 tablet 1  . atorvastatin (LIPITOR) 80 MG tablet Take 1 tablet (80 mg total) by mouth daily at 6 PM. For high cholesterol (Patient taking differently: Take 80 mg by mouth daily. For high cholesterol) 30 tablet 0  . carvedilol (COREG) 6.25 MG tablet Take 1 tablet (6.25 mg total) by mouth 2 (two) times daily with a meal. For high blood pressure 60 tablet 0  . gabapentin (NEURONTIN) 300 MG capsule Take 3 capsules (900 mg total) by mouth 3 (three) times daily. 270 capsule 0  . ibuprofen (ADVIL,MOTRIN) 800 MG tablet Take 2,400 mg by mouth 3 (three) times daily as needed.    . lamoTRIgine (LAMICTAL) 100 MG tablet Take 1 tablet (100 mg total) by mouth daily. 30 tablet 0  . metFORMIN (GLUCOPHAGE) 500 MG tablet Take 1 tablet (500 mg total) by mouth 2 (two) times daily with a meal. For diabetes management    . nitroGLYCERIN (NITROSTAT) 0.4 MG SL tablet Place 1 tablet (0.4 mg total) under the tongue every 5 (five) minutes x 3 doses as needed. For chest pain 10 tablet 0  . QUEtiapine (SEROQUEL) 200 MG tablet Take 1 tablet (200 mg total) by mouth daily. 30 tablet 0  . QUEtiapine (SEROQUEL) 300 MG tablet Take 2 tablets (600 mg total) by mouth at bedtime. 60 tablet 0  . sertraline (ZOLOFT) 100 MG tablet Take 100 mg by mouth at bedtime.      Musculoskeletal: Strength & Muscle Tone: within normal limits Gait & Station: normal Patient leans: N/A  Psychiatric Specialty Exam: Physical Exam  Nursing note and vitals reviewed. Constitutional: He is oriented to person, place, and time. He appears well-developed and well-nourished.  Eyes: EOM are normal.  Cardiovascular: Normal rate.  Respiratory: Effort normal.  Musculoskeletal: Normal range of motion.  Neurological: He is alert and oriented to person,  place, and time. He has normal reflexes.  Skin: Skin is warm and dry.    Review of Systems  Constitutional: Negative.   HENT: Negative.   Eyes: Negative.   Respiratory: Negative.   Cardiovascular: Negative.   Gastrointestinal: Negative.   Genitourinary: Negative.   Musculoskeletal: Positive for neck pain.  Skin: Negative.   Neurological: Negative.   Endo/Heme/Allergies: Negative.   Psychiatric/Behavioral: Positive for depression. Negative for hallucinations, substance abuse and suicidal ideas. The patient is nervous/anxious. The patient does not have insomnia.   All other systems reviewed and are negative.   Blood pressure 111/67, pulse 94, temperature (!) 97.5 F (36.4 C), temperature source Oral, resp. rate 20, height '6\' 2"'  (1.88 m), weight 96 kg, SpO2 98 %.Body mass index is 27.17 kg/m.  General Appearance: Fairly Groomed  Eye Contact:  Fair  Speech:  Clear and Coherent  Volume:  Normal  Mood:  Anxious  Affect:  Full Range  Thought Process:  Coherent  Orientation:  Full (Time, Place, and Person)  Thought Content:  Logical  Suicidal Thoughts:  No  Homicidal Thoughts:  No  Memory:  Immediate;   Fair  Judgement:  Impaired  Insight:  Lacking  Psychomotor Activity:  Decreased  Concentration:  Concentration: Fair  Recall:  [employer] of Knowledge:  Fair  Language:  Good  Akathisia:  Negative  Handed:  Right  AIMS (if indicated):     Assets:  Social Support  ADL's:  Intact  Cognition:  WNL  Sleep:   Insomnia     Treatment Plan Summary: Medication management  Clonidine 0.1 mg three times a day for agitation and  ADHD, can be transitioned to 0.1 mg at bedtime with 0.1 mg use twice daily as needed. Prolixin 5 mg in the morning and 10 mg at night for mood stabilization, neck moevements and sleep.  Adjustments can be made by outpatient psychiatrist as indicated.  Patient can also be transitioned to Prolixin decanoate for monthly injections with a p.o. dose maintained at  bedtime. Luvox 50 mg at bedtime for depression, anxiety and impulsivity.  This can be increased by 50 mg every 1 to 2 weeks up to a total of 300 mg daily in divided dosing with larger dose given at evening dose. Depakote 1500 mg at bedtime for mood stabilization, sleep, impulsivity and nerve pain. Recommend trough level lab to be drawn in 5 days. Benadryl 50 mg at night for sleep and to decrease side effect of other medications. Requip 0.25 mg at bedtime for restless leg syndrome.  Patient should follow-up with outpatient primary care doctor for continuation of this medication.  Disposition: Patient does not meet criteria for psychiatric inpatient admission. Supportive therapy provided about ongoing stressors. Discussed crisis plan, support from social network, calling 911, coming to the Emergency Department, and calling Suicide Hotline.   He was able to engage in safety planning including plan to return to nearest emergency department or contact emergency services if he feels unable to maintain his own safety or the safety of others. Patient had no further questions, comments, or concerns. Discharge into care of mother and sister, who agree to maintain patient safety.    Lavella Hammock, MD 05/08/2018 10:46 AM

## 2018-05-08 NOTE — ED Notes (Signed)
Patient hasn't been screaming "help" through out the night while he was in bed sleeping. He woke up twice for bathroom. He was compliant with his HS medication. No issues.

## 2018-05-08 NOTE — Progress Notes (Unsigned)
Added Requip 0.25 mg at HS for RLS.

## 2018-05-15 ENCOUNTER — Other Ambulatory Visit: Payer: Self-pay

## 2018-05-15 ENCOUNTER — Emergency Department
Admission: EM | Admit: 2018-05-15 | Discharge: 2018-05-16 | Disposition: A | Discharge status: Home / Self Care | Payer: Medicare Other | Attending: Emergency Medicine | Admitting: Emergency Medicine

## 2018-05-15 ENCOUNTER — Encounter: Payer: Self-pay | Admitting: Emergency Medicine

## 2018-05-15 DIAGNOSIS — Z7982 Long term (current) use of aspirin: Secondary | ICD-10-CM | POA: Insufficient documentation

## 2018-05-15 DIAGNOSIS — F339 Major depressive disorder, recurrent, unspecified: Secondary | ICD-10-CM | POA: Diagnosis not present

## 2018-05-15 DIAGNOSIS — Z79899 Other long term (current) drug therapy: Secondary | ICD-10-CM | POA: Diagnosis not present

## 2018-05-15 DIAGNOSIS — R451 Restlessness and agitation: Secondary | ICD-10-CM

## 2018-05-15 DIAGNOSIS — E785 Hyperlipidemia, unspecified: Secondary | ICD-10-CM | POA: Insufficient documentation

## 2018-05-15 DIAGNOSIS — I129 Hypertensive chronic kidney disease with stage 1 through stage 4 chronic kidney disease, or unspecified chronic kidney disease: Secondary | ICD-10-CM | POA: Insufficient documentation

## 2018-05-15 DIAGNOSIS — F419 Anxiety disorder, unspecified: Secondary | ICD-10-CM | POA: Diagnosis not present

## 2018-05-15 DIAGNOSIS — I2510 Atherosclerotic heart disease of native coronary artery without angina pectoris: Secondary | ICD-10-CM | POA: Insufficient documentation

## 2018-05-15 DIAGNOSIS — N189 Chronic kidney disease, unspecified: Secondary | ICD-10-CM | POA: Diagnosis not present

## 2018-05-15 DIAGNOSIS — F17210 Nicotine dependence, cigarettes, uncomplicated: Secondary | ICD-10-CM | POA: Insufficient documentation

## 2018-05-15 DIAGNOSIS — F329 Major depressive disorder, single episode, unspecified: Secondary | ICD-10-CM | POA: Diagnosis not present

## 2018-05-15 DIAGNOSIS — R45851 Suicidal ideations: Secondary | ICD-10-CM | POA: Insufficient documentation

## 2018-05-15 DIAGNOSIS — R250 Abnormal head movements: Secondary | ICD-10-CM

## 2018-05-15 DIAGNOSIS — F332 Major depressive disorder, recurrent severe without psychotic features: Secondary | ICD-10-CM | POA: Diagnosis not present

## 2018-05-15 DIAGNOSIS — E119 Type 2 diabetes mellitus without complications: Secondary | ICD-10-CM | POA: Insufficient documentation

## 2018-05-15 MED ORDER — CARVEDILOL 6.25 MG PO TABS
6.2500 mg | ORAL_TABLET | Freq: Two times a day (BID) | ORAL | Status: DC
  Administered 2018-05-16: 6.25 mg via ORAL
  Filled 2018-05-15 (×3): qty 1

## 2018-05-15 MED ORDER — LORAZEPAM 2 MG PO TABS
2.0000 mg | ORAL_TABLET | ORAL | Status: AC
  Administered 2018-05-15: 2 mg via ORAL
  Filled 2018-05-15: qty 1

## 2018-05-15 MED ORDER — FLUPHENAZINE HCL 5 MG PO TABS
10.0000 mg | ORAL_TABLET | Freq: Every day | ORAL | Status: DC
  Filled 2018-05-15: qty 2

## 2018-05-15 MED ORDER — FLUPHENAZINE HCL 5 MG PO TABS
5.0000 mg | ORAL_TABLET | Freq: Every day | ORAL | Status: DC
  Administered 2018-05-16: 5 mg via ORAL
  Filled 2018-05-15: qty 1

## 2018-05-15 MED ORDER — ASPIRIN EC 81 MG PO TBEC
81.0000 mg | DELAYED_RELEASE_TABLET | Freq: Every day | ORAL | Status: DC
  Administered 2018-05-16: 81 mg via ORAL
  Filled 2018-05-15: qty 1

## 2018-05-15 MED ORDER — FLUVOXAMINE MALEATE 50 MG PO TABS: 50.0000 mg | ORAL_TABLET | Freq: Every day | ORAL | Status: DC

## 2018-05-15 MED ORDER — FLUVOXAMINE MALEATE 50 MG PO TABS
100.0000 mg | ORAL_TABLET | Freq: Every day | ORAL | Status: DC
  Administered 2018-05-15: 100 mg via ORAL
  Filled 2018-05-15 (×2): qty 2

## 2018-05-15 MED ORDER — FLUPHENAZINE HCL 5 MG PO TABS: 5.0000 mg | ORAL_TABLET | Freq: Every day | ORAL | Status: DC

## 2018-05-15 MED ORDER — LORAZEPAM 2 MG/ML IJ SOLN
2.0000 mg | Freq: Once | INTRAMUSCULAR | Status: AC
  Administered 2018-05-15: 2 mg via INTRAMUSCULAR
  Filled 2018-05-15: qty 1

## 2018-05-15 MED ORDER — IBUPROFEN 600 MG PO TABS: 600.0000 mg | ORAL_TABLET | Freq: Three times a day (TID) | ORAL | Status: DC | PRN

## 2018-05-15 MED ORDER — DIPHENHYDRAMINE HCL 25 MG PO CAPS
50.0000 mg | ORAL_CAPSULE | Freq: Every evening | ORAL | Status: DC | PRN
  Administered 2018-05-15: 50 mg via ORAL
  Filled 2018-05-15: qty 2

## 2018-05-15 MED ORDER — DIVALPROEX SODIUM ER 500 MG PO TB24
1500.0000 mg | ORAL_TABLET | Freq: Every day | ORAL | Status: DC
  Administered 2018-05-16: 1500 mg via ORAL
  Filled 2018-05-15: qty 3

## 2018-05-15 MED ORDER — CLONIDINE HCL 0.1 MG PO TABS
0.1000 mg | ORAL_TABLET | Freq: Three times a day (TID) | ORAL | Status: DC
  Administered 2018-05-15 – 2018-05-16 (×3): 0.1 mg via ORAL
  Filled 2018-05-15 (×3): qty 1

## 2018-05-15 MED ORDER — ROPINIROLE HCL 0.25 MG PO TABS
0.2500 mg | ORAL_TABLET | Freq: Every day | ORAL | Status: DC
  Administered 2018-05-15: 0.25 mg via ORAL
  Filled 2018-05-15 (×2): qty 1

## 2018-05-15 MED ORDER — ATORVASTATIN CALCIUM 20 MG PO TABS
80.0000 mg | ORAL_TABLET | Freq: Every day | ORAL | Status: DC
  Administered 2018-05-16: 80 mg via ORAL
  Filled 2018-05-15: qty 4

## 2018-05-15 MED ORDER — LAMOTRIGINE 100 MG PO TABS
100.0000 mg | ORAL_TABLET | Freq: Every day | ORAL | Status: DC
  Administered 2018-05-16: 100 mg via ORAL
  Filled 2018-05-15: qty 1

## 2018-05-15 MED ORDER — FLUPHENAZINE HCL 5 MG PO TABS
15.0000 mg | ORAL_TABLET | Freq: Every day | ORAL | Status: DC
  Administered 2018-05-15: 15 mg via ORAL
  Filled 2018-05-15 (×2): qty 3

## 2018-05-15 MED ORDER — CLONIDINE HCL 0.1 MG PO TABS
0.1000 mg | ORAL_TABLET | ORAL | Status: AC
  Administered 2018-05-15: 0.1 mg via ORAL
  Filled 2018-05-15: qty 1

## 2018-05-15 MED ORDER — METFORMIN HCL 500 MG PO TABS
500.0000 mg | ORAL_TABLET | Freq: Two times a day (BID) | ORAL | Status: DC
  Administered 2018-05-16: 500 mg via ORAL
  Filled 2018-05-15: qty 1

## 2018-05-15 MED ORDER — GABAPENTIN 300 MG PO CAPS
900.0000 mg | ORAL_CAPSULE | Freq: Three times a day (TID) | ORAL | Status: DC
  Administered 2018-05-15 – 2018-05-16 (×3): 900 mg via ORAL
  Filled 2018-05-15 (×3): qty 3

## 2018-05-15 NOTE — ED Notes (Signed)
Nurse did administer po medications, Patient still yells out, Nurse will continue to monitor.

## 2018-05-15 NOTE — ED Notes (Addendum)
Per EDP Quale, do not order triage labs until pt has been evaluated by EDP. Quad RN [employer] notified.

## 2018-05-15 NOTE — ED Notes (Signed)
Pt. Transferred to [facility] from ED to room after screening for contraband. Report to include Situation, Background, Assessment and Recommendations from [facility]. Pt. Oriented to unit including Q15 minute rounds as well as the security cameras for their protection. Patient is alert and oriented, warm and dry in no acute distress. Patient reported SI, HI, and AVH. Pt. Encouraged to let me know if needs arise.

## 2018-05-15 NOTE — ED Notes (Signed)
Police escorted Patient over to room 21, He is yelling and screaming" help me" Patient told nurse that he wants to die" Patient encouraged and redirected to calm down, and He would for a few minutes and start back yelling, nurse awaiting orders. Patient did dress in burgundy scrubs.

## 2018-05-15 NOTE — Consult Note (Signed)
[facility] Consult   Reason for Consult:  Suicidal Ideation Referring Physician:  Dr. Mariea Clonts Patient Identification: Michael Schmitt MRN:  [id number] Principal Diagnosis: Suicidal Ideation Diagnosis:  Suicidal Ideation  Total Time spent with patient: 1 hour  Subjective: " I just do not know what to do.  I am scaring my kids and I am scaring my family.  I just want to die."  Juriel Schmitt is a 48 y.o. 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  Patient presented to ED with family member for aggressive behavior. Pt is screaming in lobby, smacking himself in head. Yelling, "I need some help!" [employer] and [employer] officer on standby, attempting verbal de-escalation. Pt seen multiple times in this ED for similar behavior. Requesting Seroquel.  Patient is seen, chart is reviewed.  During last emergency room stay, a family meeting was held and was shared decision making the following medication adjustments were made: Clonidine 0.1 mg three times a day for agitation and ADHD, can be transitioned to 0.1 mg at bedtime with 0.1 mg use twice daily as needed. Prolixin 5 mg in the morning and 10 mg at night for mood stabilization, neck movements and sleep.  Adjustments can be made by outpatient psychiatrist as indicated.  Patient can also be transitioned to Prolixin decanoate for monthly injections with a p.o. dose maintained at bedtime. Luvox 50 mg at bedtime for depression, anxiety and impulsivity.  This can be increased by 50 mg every 1 to 2 weeks up to a total of 300 mg daily in divided dosing with larger dose given at evening dose. Depakote 1500 mg at bedtime for mood stabilization, sleep, impulsivity and nerve pain. Recommend trough level lab to be drawn in 5 days. Benadryl 50 mg at night for sleep and to decrease side effect of other medications. Requip 0.25 mg at bedtime for restless leg syndrome.  Patient  should follow-up with outpatient primary care doctor for continuation of this medication.  On evaluation, patient has already been provided with Ativan, and is resting calmly in bed.  He reports that he has not been sleeping well the last couple of nights, and admits that he has been "overdosing on his fluphenazine in order to try to sleep."   Patient continues to seem anxious and severely depressed.  Reviewed with patient the plan for further medication adjustment, and he states that he was not able to see his psychiatrist today.  He reports he went for his appointment, however there were other people there and he was not seen.  Patient states he wishes he was not always hitting himself in his head and screaming.  He expresses concern that he is caring his children, and affecting them as well as scaring his mother and sister with whom he lives.  Discussed with patient the possibility of moving to a group home, and he wonders if this would be a good idea.  Patient is currently suicidal.  He denies homicidal ideation.  He is denying auditory and visual hallucinations at this time.   Risk to Self:  Yes Risk to Others:  Denies Prior Inpatient Therapy:  Multiple hospitalizations Prior Outpatient Therapy:  [facility] for outpatient psychiatry medication management and therapy.  (Patient does have difficulty with co-pays and making appointments)  Past Medical History:  Past Medical History:  Diagnosis Date  . Anxiety   . Anxiety   . Arthritis    cerv. stenosis, spondylosis, HNP- lower back ,  has been followed in pain clinic, has  had injection s in cerv. area  . Blood dyscrasia    told that when he was young he was a" free bleeder"  . CAD (coronary artery disease)   . Cervical spondylosis without myelopathy 07/24/2014  . Cervicogenic headache 07/24/2014  . Chronic kidney disease    renal calculi- passed spontaneously  . Depression   . Diabetes mellitus without complication ([place])   . Fatty liver   .  GERD (gastroesophageal reflux disease)   . Headache(784.0)   . Hyperlipidemia   . Hypertension   . Mental disorder   . MI, old   . RLS (restless legs syndrome)    detected on sleep study  . Shortness of breath   . Ventricular fibrillation ([place]) 04/05/2016    Past Surgical History:  Procedure Laterality Date  . ANTERIOR CERVICAL DECOMP/DISCECTOMY FUSION  11/13/2011   Procedure: ANTERIOR CERVICAL DECOMPRESSION/DISCECTOMY FUSION 1 LEVEL;  Surgeon: [name], MD;  Location: [facility];  Service: Neurosurgery;  Laterality: N/A;  Anterior Cervical Decompression/discectomy Fusion. Cervical three-four.  Marland Kitchen CARDIAC CATHETERIZATION N/A 01/01/2015   Procedure: Left Heart Cath and Coronary Angiography;  Surgeon: [name], MD;  Location: [facility];  Service: Cardiovascular;  Laterality: N/A;  . CARDIAC CATHETERIZATION N/A 04/05/2016   Procedure: Left Heart Cath and Coronary Angiography;  Surgeon: Belva Crome, MD;  Location: [facility];  Service: Cardiovascular;  Laterality: N/A;  . CARDIAC CATHETERIZATION N/A 04/05/2016   Procedure: Coronary Stent Intervention;  Surgeon: Belva Crome, MD;  Location: [facility];  Service: Cardiovascular;  Laterality: N/A;  . CARDIAC CATHETERIZATION N/A 04/05/2016   Procedure: Intravascular Ultrasound/IVUS;  Surgeon: Belva Crome, MD;  Location: [facility];  Service: Cardiovascular;  Laterality: N/A;  . NASAL SINUS SURGERY     2005   Family History:  Family History  Problem Relation Age of Onset  . Prostate cancer Father   . Hypertension Mother   . Kidney Stones Mother   . Anxiety disorder Mother   . Depression Mother   . COPD Sister   . Hypertension Sister   . Diabetes Sister   . Depression Sister   . Anxiety disorder Sister   . Seizures Sister   . ADD / ADHD Son   . ADD / ADHD Daughter    Family Psychiatric  History: Denies  Social History:  Social History   Substance and Sexual Activity  Alcohol Use No  .  Alcohol/week: 0.0 standard drinks   Comment: last drinked in 3 months.      Social History   Substance and Sexual Activity  Drug Use No    Social History   Socioeconomic History  . Marital status: Single    Spouse name: Not on file  . Number of children: 3  . Years of education: 23  . Highest education level: Not on file  Occupational History    Comment: unemployed  Social Needs  . Financial resource strain: Not on file  . Food insecurity:    Worry: Not on file    Inability: Not on file  . Transportation needs:    Medical: Not on file    Non-medical: Not on file  Tobacco Use  . Smoking status: Current Every Day Smoker    Packs/day: 2.00    Years: 17.00    Pack years: 34.00    Types: Cigarettes, Cigars  . Smokeless tobacco: Former Systems developer  . Tobacco  comment: occassional snuff   Substance and Sexual Activity  . Alcohol use: No    Alcohol/week: 0.0 standard drinks    Comment: last drinked in 3 months.   . Drug use: No  . Sexual activity: Not Currently  Lifestyle  . Physical activity:    Days per week: Not on file    Minutes per session: Not on file  . Stress: Not on file  Relationships  . Social connections:    Talks on phone: Not on file    Gets together: Not on file    Attends religious service: Not on file    Active member of club or organization: Not on file    Attends meetings of clubs or organizations: Not on file    Relationship status: Not on file  Other Topics Concern  . Not on file  Social History Narrative   Patient is right handed.   Patient drinks 2 sodas daily.   Additional Social History:   Lives with mother, sister and patient's children.   Allergies:   Allergies  Allergen Reactions  . Prednisone Other (See Comments)    Hypertension and makes him feel "spacey"  . Hydrocodone Other (See Comments)    Causes headaches and makes the patient irritable  . Trazodone And Nefazodone Other (See Comments)    Patient stated that this keeps him  awake; "has an opposite effect on me"  . Varenicline Other (See Comments)    Suicidal thoughts    Labs:  No results found for this or any previous visit (from the past 48 hour(s)).  Current Facility-Administered Medications  Medication Dose Route Frequency Provider Last Rate Last Dose  . aspirin EC tablet 81 mg  81 mg Oral Daily Lavella Hammock, MD      . atorvastatin (LIPITOR) tablet 80 mg  80 mg Oral Daily Lavella Hammock, MD      . Derrill Memo ON 05/16/2018] carvedilol (COREG) tablet 6.25 mg  6.25 mg Oral BID WC Lavella Hammock, MD      . cloNIDine (CATAPRES) tablet 0.1 mg  0.1 mg Oral TID Lavella Hammock, MD      . diphenhydrAMINE (BENADRYL) tablet 50 mg  50 mg Oral QHS PRN Lavella Hammock, MD      . divalproex (DEPAKOTE ER) 24 hr tablet 1,500 mg  1,500 mg Oral Daily Lavella Hammock, MD      . fluPHENAZine (PROLIXIN) tablet 10 mg  10 mg Oral QHS Lavella Hammock, MD      . Derrill Memo ON 05/16/2018] fluPHENAZine (PROLIXIN) tablet 5 mg  5 mg Oral Daily Lavella Hammock, MD      . fluvoxaMINE (LUVOX) tablet 100 mg  100 mg Oral QHS Lavella Hammock, MD      . gabapentin (NEURONTIN) capsule 900 mg  900 mg Oral TID Lavella Hammock, MD      . ibuprofen (ADVIL,MOTRIN) tablet 600 mg  600 mg Oral TID PRN Lavella Hammock, MD      . lamoTRIgine (LAMICTAL) tablet 100 mg  100 mg Oral Daily Lavella Hammock, MD      . Derrill Memo ON 05/16/2018] metFORMIN (GLUCOPHAGE) tablet 500 mg  500 mg Oral BID WC Lavella Hammock, MD      . rOPINIRole (REQUIP) tablet 0.25 mg  0.25 mg Oral QHS Lavella Hammock, MD       Current Outpatient Medications  Medication Sig Dispense Refill  . aspirin 81 MG EC tablet Take 1  tablet (81 mg total) by mouth daily. For heart health 30 tablet 1  . atorvastatin (LIPITOR) 80 MG tablet Take 1 tablet (80 mg total) by mouth daily at 6 PM. For high cholesterol (Patient taking differently: Take 80 mg by mouth daily. For high cholesterol) 30 tablet 0  . carvedilol (COREG) 6.25 MG tablet Take 1  tablet (6.25 mg total) by mouth 2 (two) times daily with a meal. For high blood pressure 60 tablet 0  . cloNIDine (CATAPRES) 0.1 MG tablet Take 1 tablet (0.1 mg total) by mouth 3 (three) times daily for 30 days. 90 tablet 0  . diphenhydrAMINE (BENADRYL ALLERGY) 25 MG tablet Take 2 tablets (50 mg total) by mouth at bedtime as needed for up to 30 days for sleep. 30 tablet 0  . divalproex (DEPAKOTE ER) 500 MG 24 hr tablet Take 3 tablets (1,500 mg total) by mouth daily. 90 tablet 0  . fluPHENAZine (PROLIXIN) 5 MG tablet Take 5 mg each morning p.o., and 10 mg each night p.o. 90 tablet 0  . fluvoxaMINE (LUVOX) 50 MG tablet Take 1 tablet (50 mg total) by mouth at bedtime for 30 days. 30 tablet 0  . gabapentin (NEURONTIN) 300 MG capsule Take 3 capsules (900 mg total) by mouth 3 (three) times daily. 270 capsule 0  . ibuprofen (ADVIL,MOTRIN) 800 MG tablet Take 2,400 mg by mouth 3 (three) times daily as needed.    . lamoTRIgine (LAMICTAL) 100 MG tablet Take 1 tablet (100 mg total) by mouth daily. 30 tablet 0  . metFORMIN (GLUCOPHAGE) 500 MG tablet Take 1 tablet (500 mg total) by mouth 2 (two) times daily with a meal. For diabetes management    . nitroGLYCERIN (NITROSTAT) 0.4 MG SL tablet Place 1 tablet (0.4 mg total) under the tongue every 5 (five) minutes x 3 doses as needed. For chest pain 10 tablet 0  . QUEtiapine (SEROQUEL) 200 MG tablet Take 1 tablet (200 mg total) by mouth daily. 30 tablet 0  . QUEtiapine (SEROQUEL) 300 MG tablet Take 2 tablets (600 mg total) by mouth at bedtime. 60 tablet 0  . rOPINIRole (REQUIP) 0.25 MG tablet Take 1 tablet (0.25 mg total) by mouth at bedtime for 30 days. 30 tablet 1  . sertraline (ZOLOFT) 100 MG tablet Take 100 mg by mouth at bedtime.      Musculoskeletal: Strength & Muscle Tone: within normal limits Gait & Station: normal Patient leans: N/A  Psychiatric Specialty Exam: Physical Exam  Nursing note and vitals reviewed. Constitutional: He is oriented to person,  place, and time. He appears well-developed and well-nourished.  HENT:  Head: Normocephalic.  Eyes: EOM are normal.  Neck: Normal range of motion.  Twists at neck  Cardiovascular: Normal rate.  Respiratory: Effort normal.  Musculoskeletal: Normal range of motion.  Neurological: He is alert and oriented to person, place, and time. He has normal reflexes.  Skin: Skin is warm and dry.    Review of Systems  Constitutional: Negative.   HENT: Negative.   Eyes: Negative.   Respiratory: Negative.   Cardiovascular: Negative.   Gastrointestinal: Negative.   Genitourinary: Negative.   Musculoskeletal: Positive for neck pain.  Skin: Negative.   Neurological: Positive for headaches.  Endo/Heme/Allergies: Negative.   Psychiatric/Behavioral: Positive for depression and suicidal ideas. Negative for hallucinations and substance abuse. The patient is nervous/anxious and has insomnia.   All other systems reviewed and are negative.   Blood pressure (!) 170/83, pulse 94, temperature 98.1 F (36.7 C), temperature source  Oral, resp. rate 20, height 6\' 2"  (1.88 m), weight 96 kg, SpO2 95 %.Body mass index is 27.17 kg/m.  General Appearance: Fairly Groomed  Eye Contact:  Minimal  Speech:  Clear and Coherent  Volume:  Decreased  Mood:  Anxious, Depressed, Hopeless and Worthless  Affect:  Blunt  Thought Process:  Coherent  Orientation:  Full (Time, Place, and Person)  Thought Content:  Logical  Suicidal Thoughts:  Yes.  without intent/plan  Homicidal Thoughts:  No  Memory:  Immediate;   Fair  Judgement:  Impaired  Insight:  Lacking  Psychomotor Activity:  Decreased  Concentration:  Concentration: Fair  Recall:  [employer] of Knowledge:  Fair  Language:  Good  Akathisia:  Negative  Handed:  Right  AIMS (if indicated):   0  Assets:  Social Support  ADL's:  Intact  Cognition:  WNL  Sleep:   Insomnia     Treatment Plan Summary: Medication management  Clonidine 0.1 mg three times a day for  agitation and ADHD, can be transitioned to 0.1 mg at bedtime with 0.1 mg use twice daily as needed. Change Prolixin 5 mg in the morning and 15 mg at night for mood stabilization, neck movements and sleep.   Adjustments can be made by outpatient psychiatrist as indicated.  Patient can also be transitioned to Prolixin decanoate for monthly injections with a p.o. dose maintained at bedtime. Increase Luvox 100 mg at bedtime for depression, anxiety and impulsivity.  This can be increased by 50 mg every 1 to 2 weeks up to a total of 300 mg daily in divided dosing with larger dose given at evening dose. Depakote ER 1500 mg at bedtime for mood stabilization, sleep, impulsivity and nerve pain. Depakote level today Benadryl 50 mg at night for sleep and to decrease side effect of antipsychotic Requip 0.25 mg at bedtime for restless leg syndrome.  Patient should follow-up with outpatient primary care doctor for continuation of this medication.  Disposition: Supportive therapy provided about ongoing stressors. Discussed crisis plan, support from social network, calling 911, coming to the Emergency Department, and calling Suicide Hotline. Monitor overnight for medication change effectiveness with further medication management in the morning.    Lavella Hammock, MD 05/15/2018 6:25 PM

## 2018-05-15 NOTE — ED Notes (Signed)
Snack and beverage given. 

## 2018-05-15 NOTE — ED Notes (Signed)
Nurse administered IM ativan per Dr. Kayleen Memos, and He said, " turn the lights out and tv off, I hope I can go to sleep" Nurse will continue to monitor, He is safe at this time, q 15 minute checks.

## 2018-05-15 NOTE — ED Notes (Signed)
Psych Dr. Talked with Patient, Patient remained calm.

## 2018-05-15 NOTE — ED Provider Notes (Signed)
[facility] Emergency Department Provider Note   ____________________________________________   First MD Initiated Contact with Patient 05/15/18 1648     (approximate)  I have reviewed the triage vital signs and the nursing notes.   HISTORY  Chief Complaint Aggressive Behavior  EM caveat, patient refused to answer some questions  HPI Michael Schmitt is a 48 y.o. male well-known to myself from previous visits  Patient presents for feeling agitated.  The patient reports that the medication changes that he had last week are not helping him and he wants to be back on his Seroquel.  Denies suicidal ideation to me, but did report this to triage.  Reports he feels very agitated and wants to get medication to calm him.  Patient reports he is on his medications.  Reports he suffered severe depression, medications are not working and he is getting agitated.  Repeatedly asking for a medicine to calm him.  Denies overdose or ingestion.  Denies active suicidal ideation to me.   Past Medical History:  Diagnosis Date  . Anxiety   . Anxiety   . Arthritis    cerv. stenosis, spondylosis, HNP- lower back , has been followed in pain clinic, has  had injection s in cerv. area  . Blood dyscrasia    told that when he was young he was a" free bleeder"  . CAD (coronary artery disease)   . Cervical spondylosis without myelopathy 07/24/2014  . Cervicogenic headache 07/24/2014  . Chronic kidney disease    renal calculi- passed spontaneously  . Depression   . Diabetes mellitus without complication ([place])   . Fatty liver   . GERD (gastroesophageal reflux disease)   . Headache(784.0)   . Hyperlipidemia   . Hypertension   . Mental disorder   . MI, old   . RLS (restless legs syndrome)    detected on sleep study  . Shortness of breath   . Ventricular fibrillation ([place]) 04/05/2016    Patient Active Problem List   Diagnosis Date Noted  . Depression, major, recurrent,  severe with psychosis ([place]) 03/30/2018  . Personality disorder ([place]) 12/31/2017  . MDD (major depressive disorder), recurrent episode, severe ([place]) 11/20/2017  . Dysthymia 10/26/2017  . Severe recurrent major depression without psychotic features ([place]) 03/12/2017  . Generalized anxiety disorder 02/05/2017  . Chronic neck pain (Primary Area of Pain) (Left) 11/22/2016  . Cervical facet syndrome (Left) 11/22/2016  . Cervical radiculitis (Left) 11/22/2016  . History of cervical spinal surgery 11/22/2016  . Cervical spondylosis 11/22/2016  . Chronic shoulder pain (Secondary Area of Pain) (Left) 11/22/2016  . Arthropathy of shoulder (Left) 11/22/2016  . Chronic low back pain [facility] Area of Pain) (Bilateral) (L>R) 11/22/2016  . Lumbar facet syndrome (Bilateral) (L>R) 11/22/2016  . Chronic pain syndrome 11/21/2016  . Long term prescription benzodiazepine use 11/21/2016  . Opiate use 11/21/2016  . Uncontrolled type 2 diabetes mellitus without complication, without long-term current use of insulin ([place]) 10/19/2016  . Chronic Suicidal ideation 09/15/2016  . Coronary artery disease 09/12/2016  . HTN (hypertension) 06/26/2016  . GERD (gastroesophageal reflux disease) 06/26/2016  . Alcohol use disorder, severe, 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[place]) 06/26/2016  . Benzodiazepine abuse ([place]) 06/11/2016  . Sedative, hypnotic or anxiolytic use disorder, severe, dependence ([place]) 05/12/2016  . Ventricular fibrillation ([place]) 04/05/2016  . Combined hyperlipidemia 01/05/2016  . Cervical spondylosis without myelopathy 07/24/2014  . Severe episode of recurrent major depressive disorder, without psychotic features ([place]) 04/15/2013  . Tobacco use disorder  10/20/2010  . Asthma 10/20/2010    Past Surgical History:  Procedure Laterality Date  . ANTERIOR CERVICAL DECOMP/DISCECTOMY FUSION  11/13/2011   Procedure: ANTERIOR CERVICAL DECOMPRESSION/DISCECTOMY FUSION 1 LEVEL;  Surgeon: [name], MD;  Location: [facility];   Service: Neurosurgery;  Laterality: N/A;  Anterior Cervical Decompression/discectomy Fusion. Cervical three-four.  Marland Kitchen CARDIAC CATHETERIZATION N/A 01/01/2015   Procedure: Left Heart Cath and Coronary Angiography;  Surgeon: [name], MD;  Location: [facility] CV [facility];  Service: Cardiovascular;  Laterality: N/A;  . CARDIAC CATHETERIZATION N/A 04/05/2016   Procedure: Left Heart Cath and Coronary Angiography;  Surgeon: Belva Crome, MD;  Location: [place] [facility];  Service: Cardiovascular;  Laterality: N/A;  . CARDIAC CATHETERIZATION N/A 04/05/2016   Procedure: Coronary Stent Intervention;  Surgeon: Belva Crome, MD;  Location: [place] [facility];  Service: Cardiovascular;  Laterality: N/A;  . CARDIAC CATHETERIZATION N/A 04/05/2016   Procedure: Intravascular Ultrasound/IVUS;  Surgeon: Belva Crome, MD;  Location: [place];  Service: Cardiovascular;  Laterality: N/A;  . NASAL SINUS SURGERY     2005    Prior to Admission medications   Medication Sig Start Date End Date Taking? Authorizing Provider  aspirin 81 MG EC tablet Take 1 tablet (81 mg total) by mouth daily. For heart health 11/21/17   Lindell Spar I, NP  atorvastatin (LIPITOR) 80 MG tablet Take 1 tablet (80 mg total) by mouth daily at 6 PM. For high cholesterol Patient taking differently: Take 80 mg by mouth daily. For high cholesterol 11/21/17   Lindell Spar I, NP  carvedilol (COREG) 6.25 MG tablet Take 1 tablet (6.25 mg total) by mouth 2 (two) times daily with a meal. For high blood pressure 11/21/17   Nwoko, Herbert Pun I, NP  cloNIDine (CATAPRES) 0.1 MG tablet Take 1 tablet (0.1 mg total) by mouth 3 (three) times daily for 30 days. 05/08/18 06/07/18  Harvest Dark, MD  diphenhydrAMINE (BENADRYL ALLERGY) 25 MG tablet Take 2 tablets (50 mg total) by mouth at bedtime as needed for up to 30 days for sleep. 05/08/18 06/07/18  Harvest Dark, MD  divalproex (DEPAKOTE ER) 500 MG 24 hr tablet Take 3 tablets (1,500 mg total) by mouth  daily. 05/08/18   Harvest Dark, MD  fluPHENAZine (PROLIXIN) 5 MG tablet Take 5 mg each morning p.o., and 10 mg each night p.o. 05/08/18   Harvest Dark, MD  fluvoxaMINE (LUVOX) 50 MG tablet Take 1 tablet (50 mg total) by mouth at bedtime for 30 days. 05/08/18 06/07/18  Harvest Dark, MD  gabapentin (NEURONTIN) 300 MG capsule Take 3 capsules (900 mg total) by mouth 3 (three) times daily. 04/01/18   Clapacs, Madie Reno, MD  ibuprofen (ADVIL,MOTRIN) 800 MG tablet Take 2,400 mg by mouth 3 (three) times daily as needed.    [provider]  lamoTRIgine (LAMICTAL) 100 MG tablet Take 1 tablet (100 mg total) by mouth daily. 04/02/18   Clapacs, Madie Reno, MD  metFORMIN (GLUCOPHAGE) 500 MG tablet Take 1 tablet (500 mg total) by mouth 2 (two) times daily with a meal. For diabetes management 11/21/17   Lindell Spar I, NP  nitroGLYCERIN (NITROSTAT) 0.4 MG SL tablet Place 1 tablet (0.4 mg total) under the tongue every 5 (five) minutes x 3 doses as needed. For chest pain 11/21/17 11/21/18  Lindell Spar I, NP  QUEtiapine (SEROQUEL) 200 MG tablet Take 1 tablet (200 mg total) by mouth daily. 04/02/18   Clapacs, Madie Reno, MD  QUEtiapine (SEROQUEL)  300 MG tablet Take 2 tablets (600 mg total) by mouth at bedtime. 04/01/18   Clapacs, Madie Reno, MD  rOPINIRole (REQUIP) 0.25 MG tablet Take 1 tablet (0.25 mg total) by mouth at bedtime for 30 days. 05/08/18 06/07/18  Lavella Hammock, MD  sertraline (ZOLOFT) 100 MG tablet Take 100 mg by mouth at bedtime.    [provider]    Allergies Prednisone; Hydrocodone; Trazodone and nefazodone; and Varenicline  Family History  Problem Relation Age of Onset  . Prostate cancer Father   . Hypertension Mother   . Kidney Stones Mother   . Anxiety disorder Mother   . Depression Mother   . COPD Sister   . Hypertension Sister   . Diabetes Sister   . Depression Sister   . Anxiety disorder Sister   . Seizures Sister   . ADD / ADHD Son   . ADD / ADHD Daughter     Social  History Social History   Tobacco Use  . Smoking status: Current Every Day Smoker    Packs/day: 2.00    Years: 17.00    Pack years: 34.00    Types: Cigarettes, Cigars  . Smokeless tobacco: Former Systems developer  . Tobacco comment: occassional snuff   Substance Use Topics  . Alcohol use: No    Alcohol/week: 0.0 standard drinks    Comment: last drinked in 3 months.   . Drug use: No    Review of Systems  Constitutional: [place] were recently seen by psychiatry a week ago and medication changes were made. Eyes: No visual changes. ENT: No sore throat. Cardiovascular: Denies chest pain. Respiratory: Denies shortness of breath. Gastrointestinal: No abdominal pain.   Neurological: Negative for headaches, focal weakness or numbness.   ____________________________________________   PHYSICAL EXAM:  VITAL SIGNS: ED Triage Vitals  Enc Vitals Group     BP 05/15/18 1632 (!) 170/83     Pulse Rate 05/15/18 1632 94     Resp 05/15/18 1632 20     Temp 05/15/18 1632 98.1 F (36.7 C)     Temp Source 05/15/18 1632 Oral     SpO2 05/15/18 1632 95 %     Weight 05/15/18 1633 211 lb 10.3 oz (96 kg)     Height 05/15/18 1633 6\' 2"  (1.88 m)     Head Circumference --      Peak Flow --      Pain Score 05/15/18 1633 0     Pain Loc --      Pain Edu? --      Excl. in [place]? --     Constitutional: Alert and oriented.  Ambulatory.  Speaks calmly to me, but then when I leave the room he began slapping himself in the face states he needs medication to calm him down. Eyes: Conjunctivae are normal. PERRL. EOMI. Head: Atraumatic. Nose: No congestion/rhinnorhea. Mouth/Throat: Mucous membranes are moist.  Oropharynx non-erythematous. Neck: No stridor.   Cardiovascular: Normal rate, regular rhythm. Grossly normal heart sounds.  Good peripheral circulation. Respiratory: Normal respiratory effort.  No retractions. Lungs CTAB. Gastrointestinal: Soft and nontender. No distention. No abdominal bruits. No CVA  tenderness. Musculoskeletal: No lower extremity tenderness nor edema.  No joint effusions. Neurologic:  Normal speech and language. No gross focal neurologic deficits are appreciated. No gait instability. Skin:  Skin is warm, dry and intact. No rash noted. Psychiatric: Mood and affect are calm when spoken to, but when I leave the room he begins slapping himself stating he needs someone to "  help him" and then he needs medicine to "calm him down".  Denies suicidal ideation.  Denies hallucinations.  Of note he did tell triage nurse he had suicidal ideation however.  ____________________________________________   LABS (all labs ordered are listed, but only abnormal results are displayed)  Labs Reviewed  VALPROIC ACID LEVEL   ____________________________________________  EKG   ____________________________________________  RADIOLOGY  ED MD interpretation:    Official radiology report(s): No results found.  ____________________________________________   PROCEDURES  Procedure(s) performed: None  Procedures  Critical Care performed: No  ____________________________________________   INITIAL IMPRESSION / ASSESSMENT AND PLAN / ED COURSE    Patient presents seeking help for agitation.  Has a long track record of presenting to the emergency department.  My overall suspicion is that the patient continues to suffer from major depression and substance abuse, but I also suspect there may be a notable element of some malingering with his recent re-presentations and then slapping himself in the face calling for medication specifically wanting "Seroquel" or calming medications.   Clinical Course as of May 15 2109  Wed May 15, 2018  1709 Patient medicated as he continues to scream out off and on, agitated slapping himself in the face at times.   [MQ]  [id number] Spoke with Dr. Barrington Ellison, she notes the patient does not require IVC by her assessment either. Patient is voluntary and he is presently  agreeable to observation overnight in ED.   [MQ]    Clinical Course User Index [MQ] Delman Kitten, MD   ----------------------------------------- 6:06 PM on 05/15/2018 -----------------------------------------  Patient is calm, he is pleasant.  No longer yelling out or slapping himself in the face.  There is no evidence of trauma on examination.  He is resting much more comfortably now.  Awaiting psychiatry consultation.  I have not placed him under IVC as he denies desire to harm himself or anyone else, has a known history of similar behavior in the past, and is currently under follow-up in treatment for same condition.  Does not appear imminently dangerous to himself or others at this time, but awaiting psych consultation.  ----------------------------------------- 9:11 PM on 05/15/2018 -----------------------------------------  Checked with [place], patient resting comfortably.  Disposition with plan for psychiatric reevaluation tomorrow.  Currently voluntary.  ____________________________________________   FINAL CLINICAL IMPRESSION(S) / ED DIAGNOSES  Final diagnoses:  Agitation     ED Discharge Orders    None       Note:  This document was prepared using Dragon voice recognition software and may include unintentional dictation errors.    Delman Kitten, MD 05/15/18 2111

## 2018-05-15 NOTE — ED Notes (Signed)
Hourly rounding reveals patient in room. No complaints, stable, in no acute distress. Q15 minute rounds and monitoring via Rover and Officer to continue.   

## 2018-05-15 NOTE — ED Notes (Signed)
Hourly rounding reveals patient in room. No complaints, stable, in no acute distress. Q15 minute rounds and monitoring via Security Cameras to continue. 

## 2018-05-15 NOTE — ED Triage Notes (Signed)
Pt presents to ED with family member for aggressive behavior. Pt is screaming in lobby, smacking himself in head. Yelling, "I need some help!" [employer] and [employer] officer on standby, attempting verbal de-escalation. Pt seen multiple times in this ED for similar behavior. Requesting seroquel.

## 2018-05-16 DIAGNOSIS — R250 Abnormal head movements: Secondary | ICD-10-CM | POA: Diagnosis not present

## 2018-05-16 DIAGNOSIS — F332 Major depressive disorder, recurrent severe without psychotic features: Secondary | ICD-10-CM | POA: Diagnosis not present

## 2018-05-16 LAB — VALPROIC ACID LEVEL: VALPROIC ACID LVL: 102 ug/mL — AB (ref 50.0–100.0)

## 2018-05-16 MED ORDER — FLUVOXAMINE MALEATE 100 MG PO TABS: 100.0000 mg | ORAL_TABLET | Freq: Every day | ORAL | 0 refills | Status: DC

## 2018-05-16 MED ORDER — DIVALPROEX SODIUM ER 500 MG PO TB24: 1000.0000 mg | ORAL_TABLET | Freq: Every day | ORAL | 0 refills | Status: DC

## 2018-05-16 MED ORDER — DIPHENHYDRAMINE HCL 25 MG PO TABS: 50.0000 mg | ORAL_TABLET | Freq: Every day | ORAL | 0 refills | Status: DC

## 2018-05-16 MED ORDER — FLUPHENAZINE HCL 5 MG PO TABS: 5.0000 mg | ORAL_TABLET | Freq: Every day | ORAL | 0 refills | Status: DC

## 2018-05-16 MED ORDER — CLONIDINE HCL 0.1 MG PO TABS: 0.1000 mg | ORAL_TABLET | Freq: Three times a day (TID) | ORAL | 0 refills | Status: DC

## 2018-05-16 MED ORDER — FLUPHENAZINE HCL 5 MG PO TABS: 15.0000 mg | ORAL_TABLET | Freq: Every day | ORAL | 0 refills | Status: DC

## 2018-05-16 MED ORDER — DIVALPROEX SODIUM ER 500 MG PO TB24: 1000.0000 mg | ORAL_TABLET | Freq: Every day | ORAL | Status: DC

## 2018-05-16 NOTE — ED Notes (Signed)
Hourly rounding reveals patient in room. No complaints, stable, in no acute distress. Q15 minute rounds and monitoring via Security Cameras to continue. 

## 2018-05-16 NOTE — Discharge Instructions (Signed)
Follow-up with [facility] for you psychiatric needs.

## 2018-05-16 NOTE — Progress Notes (Signed)
Called pt's sister, Olin Hauser, per sister she can't come and pick up the patient because it is snowing and she can't drive in the snow.  She stated that she doesn't have anybody else that can come and pick him up, so that he may have to stay tonight.

## 2018-05-16 NOTE — Consult Note (Signed)
[facility] Psychiatry Consult   Reason for Consult:  Suicidal Ideation Referring Physician:  Dr. Mariea Clonts Patient Identification: Michael Schmitt MRN:  [id number] Principal Diagnosis: Suicidal Ideation Diagnosis:  Suicidal Ideation  Total Time spent with patient: 45 minutes  Subjective: " I slept really well last night.  I feel better."  Michael Schmitt is a 48 y.o. 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  Patient presented to ED with family member for aggressive behavior. Pt is screaming in lobby, smacking himself in head. Yelling, "I need some help!" [employer] and [employer] officer on standby, attempting verbal de-escalation. Pt seen multiple times in this ED for similar behavior. Requesting Seroquel.  Patient is seen, chart is reviewed.  During last emergency room stay, a family meeting was held and was shared decision making the following medication adjustments were made: Clonidine 0.1 mg three times a day for agitation and ADHD, can be transitioned to 0.1 mg at bedtime with 0.1 mg use twice daily as needed. Prolixin 5 mg in the morning and 10 mg at night for mood stabilization, neck movements and sleep.  Adjustments can be made by outpatient psychiatrist as indicated.  Patient can also be transitioned to Prolixin decanoate for monthly injections with a p.o. dose maintained at bedtime. Luvox 50 mg at bedtime for depression, anxiety and impulsivity.  This can be increased by 50 mg every 1 to 2 weeks up to a total of 300 mg daily in divided dosing with larger dose given at evening dose. Depakote 1500 mg at bedtime for mood stabilization, sleep, impulsivity and nerve pain. Recommend trough level lab to be drawn in 5 days. Benadryl 50 mg at night for sleep and to decrease side effect of other medications. Requip 0.25 mg at bedtime for restless leg syndrome.  Patient should follow-up with outpatient primary care  doctor for continuation of this medication.  Medication adjustments made as per discussed that family meeting with last emergency room stay: Medication management  Clonidine 0.1 mg three times a day for agitation and ADHD, can be transitioned to 0.1 mg at bedtime with 0.1 mg use twice daily as needed. Change Prolixin 5 mg in the morning and 15 mg at night for mood stabilization, neck movements and sleep.   Adjustments can be made by outpatient psychiatrist as indicated.  Patient can also be transitioned to Prolixin decanoate for monthly injections with a p.o. dose maintained at bedtime. Increase Luvox 100 mg at bedtime for depression, anxiety and impulsivity.  This can be increased by 50 mg every 1 to 2 weeks up to a total of 300 mg daily in divided dosing with larger dose given at evening dose. Depakote ER 1500 mg at bedtime for mood stabilization, sleep, impulsivity and nerve pain. Depakote level today Benadryl 50 mg at night for sleep and to decrease side effect of antipsychotic Requip 0.25 mg at bedtime for restless leg syndrome.  Patient should follow-up with outpatient primary care doctor for continuation of this medication.  Labs reviewed, valproic acid level was 102.  Nurses report that patient slept overnight, and has continued sleeping through the day, despite agitated patients in the common area.  Patient has had no behavioral outbursts of yelling, no abnormal movements appreciated overnight.  Patient is awakened approximately 1 PM.  He reports that he slept well, and is feeling better.  Of note patient does use nicotine at home, which could account for decreased effectiveness of medication at home.  On evaluation, patient is sedated, but arouses easily.  He is oriented to person place and time.  Patient sits calmly on the bed, answers questions appropriately with appropriate behavior.  He currently is having no desire to harm himself/hit his head.  He is denying suicidal and homicidal  ideation.  Patient has not had any auditory and visual hallucinations.  Patient denies that he damaged any property at his sister's house this occasion.  He continues to state that he wishes he did not have such severe depressed episodes.  He is agreeable to further medication management, and agrees to return to the emergency department should his condition deteriorate again.  Risk to Self:  Yes Risk to Others:  Denies Prior Inpatient Therapy:  Multiple hospitalizations Prior Outpatient Therapy:  [facility] for outpatient psychiatry medication management and therapy.  (Patient does have difficulty with co-pays and making appointments)  Past Medical History:  Past Medical History:  Diagnosis Date  . Anxiety   . Anxiety   . Arthritis    cerv. stenosis, spondylosis, HNP- lower back , has been followed in pain clinic, has  had injection s in cerv. area  . Blood dyscrasia    told that when he was young he was a" free bleeder"  . CAD (coronary artery disease)   . Cervical spondylosis without myelopathy 07/24/2014  . Cervicogenic headache 07/24/2014  . Chronic kidney disease    renal calculi- passed spontaneously  . Depression   . Diabetes mellitus without complication ([place])   . Fatty liver   . GERD (gastroesophageal reflux disease)   . Headache(784.0)   . Hyperlipidemia   . Hypertension   . Mental disorder   . MI, old   . RLS (restless legs syndrome)    detected on sleep study  . Shortness of breath   . Ventricular fibrillation ([place]) 04/05/2016    Past Surgical History:  Procedure Laterality Date  . ANTERIOR CERVICAL DECOMP/DISCECTOMY FUSION  11/13/2011   Procedure: ANTERIOR CERVICAL DECOMPRESSION/DISCECTOMY FUSION 1 LEVEL;  Surgeon: [name], MD;  Location: [facility];  Service: Neurosurgery;  Laterality: N/A;  Anterior Cervical Decompression/discectomy Fusion. Cervical three-four.  Marland Kitchen CARDIAC CATHETERIZATION N/A 01/01/2015   Procedure: Left Heart Cath and Coronary Angiography;   Surgeon: [name], MD;  Location: [facility];  Service: Cardiovascular;  Laterality: N/A;  . CARDIAC CATHETERIZATION N/A 04/05/2016   Procedure: Left Heart Cath and Coronary Angiography;  Surgeon: Belva Crome, MD;  Location: [facility];  Service: Cardiovascular;  Laterality: N/A;  . CARDIAC CATHETERIZATION N/A 04/05/2016   Procedure: Coronary Stent Intervention;  Surgeon: Belva Crome, MD;  Location: [facility];  Service: Cardiovascular;  Laterality: N/A;  . CARDIAC CATHETERIZATION N/A 04/05/2016   Procedure: Intravascular Ultrasound/IVUS;  Surgeon: Belva Crome, MD;  Location: [facility];  Service: Cardiovascular;  Laterality: N/A;  . NASAL SINUS SURGERY     2005   Family History:  Family History  Problem Relation Age of Onset  . Prostate cancer Father   . Hypertension Mother   . Kidney Stones Mother   . Anxiety disorder Mother   . Depression Mother   . COPD Sister   . Hypertension Sister   . Diabetes Sister   . Depression Sister   . Anxiety disorder Sister   . Seizures Sister   . ADD / ADHD Son   . ADD / ADHD Daughter    Family Psychiatric  History: Denies  Social History:  Social History   Substance and Sexual Activity  Alcohol Use No  . Alcohol/week: 0.0 standard drinks   Comment: last drinked in 3 months.      Social History   Substance and Sexual Activity  Drug Use No    Social History   Socioeconomic History  . Marital status: Single    Spouse name: Not on file  . Number of children: 3  . Years of education: 12  . Highest education level: Not on file  Occupational History    Comment: unemployed  Social Needs  . Financial resource strain: Not on file  . Food insecurity:    Worry: Not on file    Inability: Not on file  . Transportation needs:    Medical: Not on file    Non-medical: Not on file  Tobacco Use  . Smoking status: Current Every Day Smoker    Packs/day: 2.00    Years: 17.00    Pack years: 34.00     Types: Cigarettes, Cigars  . Smokeless tobacco: Former Systems developer  . Tobacco comment: occassional snuff   Substance and Sexual Activity  . Alcohol use: No    Alcohol/week: 0.0 standard drinks    Comment: last drinked in 3 months.   . Drug use: No  . Sexual activity: Not Currently  Lifestyle  . Physical activity:    Days per week: Not on file    Minutes per session: Not on file  . Stress: Not on file  Relationships  . Social connections:    Talks on phone: Not on file    Gets together: Not on file    Attends religious service: Not on file    Active member of club or organization: Not on file    Attends meetings of clubs or organizations: Not on file    Relationship status: Not on file  Other Topics Concern  . Not on file  Social History Narrative   Patient is right handed.   Patient drinks 2 sodas daily.   Additional Social History:   Lives with mother, sister and patient's children.   Allergies:   Allergies  Allergen Reactions  . Prednisone Other (See Comments)    Hypertension and makes him feel "spacey"  . Hydrocodone Other (See Comments)    Causes headaches and makes the patient irritable  . Trazodone And Nefazodone Other (See Comments)    Patient stated that this keeps him awake; "has an opposite effect on me"  . Varenicline Other (See Comments)    Suicidal thoughts    Labs:  No results found for this or any previous visit (from the past 48 hour(s)).  Current Facility-Administered Medications  Medication Dose Route Frequency Provider Last Rate Last Dose  . aspirin EC tablet 81 mg  81 mg Oral Daily Lavella Hammock, MD   81 mg at 05/16/18 0233  . atorvastatin (LIPITOR) tablet 80 mg  80 mg Oral Daily Lavella Hammock, MD   80 mg at 05/16/18 4356  . carvedilol (COREG) tablet 6.25 mg  6.25 mg Oral BID WC Lavella Hammock, MD   6.25 mg at 05/16/18 0901  . cloNIDine (CATAPRES) tablet 0.1 mg  0.1 mg Oral TID Lavella Hammock, MD   0.1 mg at 05/16/18 8616  .  diphenhydrAMINE (BENADRYL) capsule 50 mg  50 mg Oral QHS PRN Lavella Hammock, MD   50 mg at 05/15/18 2203  . divalproex (DEPAKOTE ER) 24 hr tablet 1,500 mg  1,500 mg Oral Daily  Lavella Hammock, MD   1,500 mg at 05/16/18 9476  . fluPHENAZine (PROLIXIN) tablet 15 mg  15 mg Oral QHS Lavella Hammock, MD   15 mg at 05/15/18 2204  . fluPHENAZine (PROLIXIN) tablet 5 mg  5 mg Oral Daily Lavella Hammock, MD   5 mg at 05/16/18 5465  . fluvoxaMINE (LUVOX) tablet 100 mg  100 mg Oral QHS Lavella Hammock, MD   100 mg at 05/15/18 2204  . gabapentin (NEURONTIN) capsule 900 mg  900 mg Oral TID Lavella Hammock, MD   900 mg at 05/16/18 0354  . ibuprofen (ADVIL,MOTRIN) tablet 600 mg  600 mg Oral TID PRN Lavella Hammock, MD      . lamoTRIgine (LAMICTAL) tablet 100 mg  100 mg Oral Daily Lavella Hammock, MD   100 mg at 05/16/18 6568  . metFORMIN (GLUCOPHAGE) tablet 500 mg  500 mg Oral BID WC Lavella Hammock, MD   500 mg at 05/16/18 0901  . rOPINIRole (REQUIP) tablet 0.25 mg  0.25 mg Oral QHS Lavella Hammock, MD   0.25 mg at 05/15/18 2203   Current Outpatient Medications  Medication Sig Dispense Refill  . aspirin 81 MG EC tablet Take 1 tablet (81 mg total) by mouth daily. For heart health 30 tablet 1  . atorvastatin (LIPITOR) 80 MG tablet Take 1 tablet (80 mg total) by mouth daily at 6 PM. For high cholesterol (Patient taking differently: Take 80 mg by mouth daily. For high cholesterol) 30 tablet 0  . carvedilol (COREG) 6.25 MG tablet Take 1 tablet (6.25 mg total) by mouth 2 (two) times daily with a meal. For high blood pressure 60 tablet 0  . cloNIDine (CATAPRES) 0.1 MG tablet Take 1 tablet (0.1 mg total) by mouth 3 (three) times daily for 30 days. 90 tablet 0  . diphenhydrAMINE (BENADRYL ALLERGY) 25 MG tablet Take 2 tablets (50 mg total) by mouth at bedtime as needed for up to 30 days for sleep. 30 tablet 0  . divalproex (DEPAKOTE ER) 500 MG 24 hr tablet Take 3 tablets (1,500 mg total) by mouth daily. 90  tablet 0  . fluPHENAZine (PROLIXIN) 5 MG tablet Take 5 mg each morning p.o., and 10 mg each night p.o. 90 tablet 0  . fluvoxaMINE (LUVOX) 50 MG tablet Take 1 tablet (50 mg total) by mouth at bedtime for 30 days. 30 tablet 0  . gabapentin (NEURONTIN) 300 MG capsule Take 3 capsules (900 mg total) by mouth 3 (three) times daily. 270 capsule 0  . ibuprofen (ADVIL,MOTRIN) 800 MG tablet Take 2,400 mg by mouth 3 (three) times daily as needed.    . lamoTRIgine (LAMICTAL) 100 MG tablet Take 1 tablet (100 mg total) by mouth daily. 30 tablet 0  . metFORMIN (GLUCOPHAGE) 500 MG tablet Take 1 tablet (500 mg total) by mouth 2 (two) times daily with a meal. For diabetes management    . nitroGLYCERIN (NITROSTAT) 0.4 MG SL tablet Place 1 tablet (0.4 mg total) under the tongue every 5 (five) minutes x 3 doses as needed. For chest pain 10 tablet 0  . QUEtiapine (SEROQUEL) 200 MG tablet Take 1 tablet (200 mg total) by mouth daily. 30 tablet 0  . QUEtiapine (SEROQUEL) 300 MG tablet Take 2 tablets (600 mg total) by mouth at bedtime. 60 tablet 0  . rOPINIRole (REQUIP) 0.25 MG tablet Take 1 tablet (0.25 mg total) by mouth at bedtime for 30 days. 30 tablet 1  . sertraline (  ZOLOFT) 100 MG tablet Take 100 mg by mouth at bedtime.      Musculoskeletal: Strength & Muscle Tone: within normal limits Gait & Station: normal Patient leans: N/A  Psychiatric Specialty Exam: Physical Exam  Nursing note and vitals reviewed. Constitutional: He is oriented to person, place, and time. He appears well-developed and well-nourished.  HENT:  Head: Normocephalic.  Eyes: EOM are normal.  Neck: Normal range of motion.  Cardiovascular: Normal rate.  Respiratory: Effort normal.  Musculoskeletal: Normal range of motion.  Neurological: He is alert and oriented to person, place, and time. He has normal reflexes.  Skin: Skin is warm and dry.    Review of Systems  Constitutional: Negative.   HENT: Negative.   Eyes: Negative.    Respiratory: Negative.   Cardiovascular: Negative.   Gastrointestinal: Negative.   Genitourinary: Negative.   Musculoskeletal: Positive for neck pain.  Skin: Negative.   Neurological: Negative for headaches.  Endo/Heme/Allergies: Negative.   Psychiatric/Behavioral: Positive for depression. Negative for hallucinations, substance abuse and suicidal ideas. The patient is not nervous/anxious and does not have insomnia.   All other systems reviewed and are negative.   Blood pressure (!) 105/58, pulse 68, temperature 98.6 F (37 C), temperature source Oral, resp. rate 18, height 6' 2" (1.88 m), weight 96 kg, SpO2 98 %.Body mass index is 27.17 kg/m.  General Appearance: Neat  Eye Contact:  Good  Speech:  Clear and Coherent  Volume:  Normal  Mood:  Dysphoric  Affect:  Appropriate and Congruent  Thought Process:  Coherent  Orientation:  Full (Time, Place, and Person)  Thought Content:  Logical  Suicidal Thoughts:  No  Homicidal Thoughts:  No  Memory:  Immediate;   Fair  Judgement:  Fair  Insight:  Fair  Psychomotor Activity:  Normal  Concentration:  Concentration: Fair  Recall:  [employer] of Knowledge:  Fair  Language:  Good  Akathisia:  Negative  Handed:  Right  AIMS (if indicated):   0  Assets:  Social Support  ADL's:  Intact  Cognition:  WNL  Sleep:   improved    Treatment Plan Summary: Medication management  Continue Clonidine 0.1 mg three times a day as needed for agitation and ADHD, can be transitioned to 0.1 mg at bedtime with 0.1 mg use twice daily as needed. Continue Prolixin 5 mg in the morning and 15 mg at night for mood stabilization, neck movements and sleep.   Adjustments can be made by outpatient psychiatrist as indicated.  Patient can also be transitioned to Prolixin decanoate for monthly injections with a p.o. dose maintained at bedtime. Continue Luvox 100 mg at bedtime for depression, anxiety and impulsivity.  This can be increased by 50 mg every 1 to 2  weeks up to a total of 300 mg daily in divided dosing with larger dose given at evening dose. Decrease Depakote ER 1000 mg at bedtime for mood stabilization, sleep, impulsivity and nerve pain. Continue Benadryl 50 mg at night for sleep and to decrease side effect of antipsychotic Continue Requip 0.25 mg at bedtime for restless leg syndrome.  Patient should follow-up with outpatient primary care doctor for continuation of this medication.  Disposition: No evidence of imminent risk to self or others at present.   Patient does not meet criteria for psychiatric inpatient admission. Supportive therapy provided about ongoing stressors. Discussed crisis plan, support from social network, calling 911, coming to the Emergency Department, and calling Suicide Hotline.     Medication instructions written  out.  Met with family and patient to review medication changes.  He was able to engage in safety planning including plan to return to nearest emergency department or contact emergency services if he feels unable to maintain his own safety or the safety of others. Patient had no further questions, comments, or concerns. Discharge into care of his sister, who agrees to maintain patient safety.    Lavella Hammock, MD 05/16/2018 10:18 AM

## 2018-05-16 NOTE — ED Provider Notes (Signed)
-----------------------------------------   8:25 AM on 05/16/2018 -----------------------------------------   Blood pressure (!) 105/58, pulse 68, temperature 98.6 F (37 C), temperature source Oral, resp. rate 18, height 6\' 2"  (1.88 m), weight 96 kg, SpO2 98 %.  The patient is calm and cooperative at this time.  There have been no acute events since the last update.  Awaiting disposition plan from Behavioral Medicine team.    Schuyler Amor, MD 05/16/18 [phone]

## 2018-05-16 NOTE — ED Notes (Signed)
Received call from lobby stating that pt's sister was in the lobby.  MD aware.  Awaiting discharge paperwork at this time.

## 2018-05-17 ENCOUNTER — Inpatient Hospital Stay
Admission: AD | Admit: 2018-05-17 | Discharge: 2018-05-20 | DRG: 885 | Disposition: A | Discharge status: Home / Self Care | Payer: Medicare Other | Source: Intra-hospital | Attending: Psychiatry | Admitting: Psychiatry

## 2018-05-17 ENCOUNTER — Emergency Department (EMERGENCY_DEPARTMENT_HOSPITAL)
Admission: EM | Admit: 2018-05-17 | Discharge: 2018-05-17 | Disposition: A | Discharge status: Other Acute Inpatient Hospital | Payer: Medicare Other | Source: Home / Self Care | Attending: Emergency Medicine | Admitting: Emergency Medicine

## 2018-05-17 ENCOUNTER — Encounter: Payer: Self-pay | Admitting: Emergency Medicine

## 2018-05-17 ENCOUNTER — Other Ambulatory Visit: Payer: Self-pay

## 2018-05-17 DIAGNOSIS — T50902A Poisoning by unspecified drugs, medicaments and biological substances, intentional self-harm, initial encounter: Secondary | ICD-10-CM | POA: Diagnosis not present

## 2018-05-17 DIAGNOSIS — G2581 Restless legs syndrome: Secondary | ICD-10-CM | POA: Diagnosis present

## 2018-05-17 DIAGNOSIS — X838XXA Intentional self-harm by other specified means, initial encounter: Secondary | ICD-10-CM | POA: Insufficient documentation

## 2018-05-17 DIAGNOSIS — E1122 Type 2 diabetes mellitus with diabetic chronic kidney disease: Secondary | ICD-10-CM | POA: Insufficient documentation

## 2018-05-17 DIAGNOSIS — F332 Major depressive disorder, recurrent severe without psychotic features: Secondary | ICD-10-CM

## 2018-05-17 DIAGNOSIS — Z8249 Family history of ischemic heart disease and other diseases of the circulatory system: Secondary | ICD-10-CM | POA: Diagnosis not present

## 2018-05-17 DIAGNOSIS — Y92009 Unspecified place in unspecified non-institutional (private) residence as the place of occurrence of the external cause: Secondary | ICD-10-CM

## 2018-05-17 DIAGNOSIS — Y939 Activity, unspecified: Secondary | ICD-10-CM | POA: Insufficient documentation

## 2018-05-17 DIAGNOSIS — T50901A Poisoning by unspecified drugs, medicaments and biological substances, accidental (unintentional), initial encounter: Secondary | ICD-10-CM

## 2018-05-17 DIAGNOSIS — N189 Chronic kidney disease, unspecified: Secondary | ICD-10-CM

## 2018-05-17 DIAGNOSIS — R45851 Suicidal ideations: Secondary | ICD-10-CM | POA: Diagnosis present

## 2018-05-17 DIAGNOSIS — F4010 Social phobia, unspecified: Secondary | ICD-10-CM | POA: Diagnosis present

## 2018-05-17 DIAGNOSIS — J45909 Unspecified asthma, uncomplicated: Secondary | ICD-10-CM | POA: Insufficient documentation

## 2018-05-17 DIAGNOSIS — Z7982 Long term (current) use of aspirin: Secondary | ICD-10-CM

## 2018-05-17 DIAGNOSIS — I25119 Atherosclerotic heart disease of native coronary artery with unspecified angina pectoris: Secondary | ICD-10-CM | POA: Diagnosis present

## 2018-05-17 DIAGNOSIS — K760 Fatty (change of) liver, not elsewhere classified: Secondary | ICD-10-CM | POA: Diagnosis present

## 2018-05-17 DIAGNOSIS — I129 Hypertensive chronic kidney disease with stage 1 through stage 4 chronic kidney disease, or unspecified chronic kidney disease: Secondary | ICD-10-CM | POA: Diagnosis present

## 2018-05-17 DIAGNOSIS — Z818 Family history of other mental and behavioral disorders: Secondary | ICD-10-CM

## 2018-05-17 DIAGNOSIS — Z825 Family history of asthma and other chronic lower respiratory diseases: Secondary | ICD-10-CM | POA: Diagnosis not present

## 2018-05-17 DIAGNOSIS — K219 Gastro-esophageal reflux disease without esophagitis: Secondary | ICD-10-CM | POA: Diagnosis present

## 2018-05-17 DIAGNOSIS — Z888 Allergy status to other drugs, medicaments and biological substances status: Secondary | ICD-10-CM | POA: Diagnosis not present

## 2018-05-17 DIAGNOSIS — Z833 Family history of diabetes mellitus: Secondary | ICD-10-CM | POA: Diagnosis not present

## 2018-05-17 DIAGNOSIS — Z7984 Long term (current) use of oral hypoglycemic drugs: Secondary | ICD-10-CM

## 2018-05-17 DIAGNOSIS — Z915 Personal history of self-harm: Secondary | ICD-10-CM

## 2018-05-17 DIAGNOSIS — F419 Anxiety disorder, unspecified: Secondary | ICD-10-CM | POA: Diagnosis present

## 2018-05-17 DIAGNOSIS — Y999 Unspecified external cause status: Secondary | ICD-10-CM | POA: Insufficient documentation

## 2018-05-17 DIAGNOSIS — F17210 Nicotine dependence, cigarettes, uncomplicated: Secondary | ICD-10-CM | POA: Diagnosis present

## 2018-05-17 DIAGNOSIS — Z8042 Family history of malignant neoplasm of prostate: Secondary | ICD-10-CM

## 2018-05-17 DIAGNOSIS — Z981 Arthrodesis status: Secondary | ICD-10-CM

## 2018-05-17 DIAGNOSIS — F333 Major depressive disorder, recurrent, severe with psychotic symptoms: Secondary | ICD-10-CM | POA: Diagnosis present

## 2018-05-17 DIAGNOSIS — E785 Hyperlipidemia, unspecified: Secondary | ICD-10-CM | POA: Diagnosis present

## 2018-05-17 DIAGNOSIS — F1310 Sedative, hypnotic or anxiolytic abuse, uncomplicated: Secondary | ICD-10-CM | POA: Diagnosis present

## 2018-05-17 DIAGNOSIS — T43222A Poisoning by selective serotonin reuptake inhibitors, intentional self-harm, initial encounter: Secondary | ICD-10-CM

## 2018-05-17 LAB — COMPREHENSIVE METABOLIC PANEL
ALK PHOS: 107 U/L (ref 38–126)
ALT: 22 U/L (ref 0–44)
AST: 26 U/L (ref 15–41)
Albumin: 4.2 g/dL (ref 3.5–5.0)
Anion gap: 12 (ref 5–15)
BUN: 7 mg/dL (ref 6–20)
CO2: 21 mmol/L — ABNORMAL LOW (ref 22–32)
Calcium: 9.5 mg/dL (ref 8.9–10.3)
Chloride: 100 mmol/L (ref 98–111)
Creatinine, Ser: 0.94 mg/dL (ref 0.61–1.24)
GFR calc Af Amer: >60 mL/min (ref >60)
GFR calc non Af Amer: >60 mL/min (ref >60)
Glucose, Bld: 145 mg/dL — ABNORMAL HIGH (ref 70–99)
Potassium: 4.3 mmol/L (ref 3.5–5.1)
Sodium: 133 mmol/L — ABNORMAL LOW (ref 135–145)
Total Bilirubin: 0.5 mg/dL (ref 0.3–1.2)
Total Protein: 7.3 g/dL (ref 6.5–8.1)

## 2018-05-17 LAB — SALICYLATE LEVEL: Salicylate Lvl: <7 mg/dL (ref 2.8–30.0)

## 2018-05-17 LAB — CBC
HCT: 44.7 % (ref 39.0–52.0)
Hemoglobin: 15.4 g/dL (ref 13.0–17.0)
MCH: 32 pg (ref 26.0–34.0)
MCHC: 34.5 g/dL (ref 30.0–36.0)
MCV: 92.7 fL (ref 80.0–100.0)
Platelets: 253 10*3/uL (ref 150–400)
RBC: 4.82 MIL/uL (ref 4.22–5.81)
RDW: 15.9 % — AB (ref 11.5–15.5)
WBC: 12.8 10*3/uL — ABNORMAL HIGH (ref 4.0–10.5)
nRBC: 0.2 % (ref 0.0–0.2)

## 2018-05-17 LAB — ETHANOL

## 2018-05-17 LAB — GLUCOSE, CAPILLARY: Glucose-Capillary: 159 mg/dL — ABNORMAL HIGH (ref 70–99)

## 2018-05-17 LAB — ACETAMINOPHEN LEVEL: Acetaminophen (Tylenol), Serum: <10 ug/mL — ABNORMAL LOW (ref 10–30)

## 2018-05-17 MED ORDER — DIVALPROEX SODIUM ER 500 MG PO TB24
1000.0000 mg | ORAL_TABLET | Freq: Every day | ORAL | Status: DC
  Administered 2018-05-17 – 2018-05-19 (×3): 1000 mg via ORAL
  Filled 2018-05-17 (×3): qty 2

## 2018-05-17 MED ORDER — DIPHENHYDRAMINE HCL 25 MG PO CAPS
50.0000 mg | ORAL_CAPSULE | Freq: Every day | ORAL | Status: DC
  Administered 2018-05-17 – 2018-05-19 (×3): 50 mg via ORAL
  Filled 2018-05-17 (×3): qty 2

## 2018-05-17 MED ORDER — ASPIRIN EC 81 MG PO TBEC
81.0000 mg | DELAYED_RELEASE_TABLET | Freq: Every day | ORAL | Status: DC
  Administered 2018-05-17 – 2018-05-20 (×4): 81 mg via ORAL
  Filled 2018-05-17 (×4): qty 1

## 2018-05-17 MED ORDER — TRAZODONE HCL 100 MG PO TABS: 100.0000 mg | ORAL_TABLET | Freq: Every evening | ORAL | Status: DC | PRN

## 2018-05-17 MED ORDER — ALUM & MAG HYDROXIDE-SIMETH 200-200-20 MG/5ML PO SUSP: 30.0000 mL | ORAL | Status: DC | PRN

## 2018-05-17 MED ORDER — ACETAMINOPHEN 325 MG PO TABS
650.0000 mg | ORAL_TABLET | Freq: Four times a day (QID) | ORAL | Status: DC | PRN
  Administered 2018-05-17 – 2018-05-20 (×6): 650 mg via ORAL
  Filled 2018-05-17 (×6): qty 2

## 2018-05-17 MED ORDER — FLUVOXAMINE MALEATE 50 MG PO TABS
100.0000 mg | ORAL_TABLET | Freq: Every day | ORAL | Status: DC
  Administered 2018-05-17 – 2018-05-19 (×3): 100 mg via ORAL
  Filled 2018-05-17 (×3): qty 2

## 2018-05-17 MED ORDER — GABAPENTIN 300 MG PO CAPS
900.0000 mg | ORAL_CAPSULE | Freq: Three times a day (TID) | ORAL | Status: DC
  Administered 2018-05-17 – 2018-05-20 (×9): 900 mg via ORAL
  Filled 2018-05-17 (×10): qty 3

## 2018-05-17 MED ORDER — FLUPHENAZINE HCL 5 MG PO TABS
15.0000 mg | ORAL_TABLET | Freq: Every day | ORAL | Status: DC
  Administered 2018-05-17 – 2018-05-19 (×3): 15 mg via ORAL
  Filled 2018-05-17 (×4): qty 3

## 2018-05-17 MED ORDER — FLUPHENAZINE HCL 5 MG PO TABS
5.0000 mg | ORAL_TABLET | Freq: Every day | ORAL | Status: DC
  Administered 2018-05-18 – 2018-05-20 (×3): 5 mg via ORAL
  Filled 2018-05-17 (×3): qty 1

## 2018-05-17 MED ORDER — CARVEDILOL 6.25 MG PO TABS
6.2500 mg | ORAL_TABLET | Freq: Two times a day (BID) | ORAL | Status: DC
  Administered 2018-05-17: 6.25 mg via ORAL
  Filled 2018-05-17 (×2): qty 1

## 2018-05-17 MED ORDER — MAGNESIUM HYDROXIDE 400 MG/5ML PO SUSP
30.0000 mL | Freq: Every day | ORAL | Status: DC | PRN
  Administered 2018-05-19: 30 mL via ORAL
  Filled 2018-05-17: qty 30

## 2018-05-17 MED ORDER — ATORVASTATIN CALCIUM 20 MG PO TABS
80.0000 mg | ORAL_TABLET | Freq: Every day | ORAL | Status: DC
  Administered 2018-05-17 – 2018-05-19 (×3): 80 mg via ORAL
  Filled 2018-05-17 (×4): qty 4

## 2018-05-17 MED ORDER — METFORMIN HCL 500 MG PO TABS
500.0000 mg | ORAL_TABLET | Freq: Two times a day (BID) | ORAL | Status: DC
  Administered 2018-05-17 – 2018-05-20 (×6): 500 mg via ORAL
  Filled 2018-05-17 (×7): qty 1

## 2018-05-17 MED ORDER — CLONIDINE HCL 0.1 MG PO TABS
0.1000 mg | ORAL_TABLET | Freq: Three times a day (TID) | ORAL | Status: DC
  Administered 2018-05-17: 0.1 mg via ORAL
  Filled 2018-05-17: qty 1

## 2018-05-17 MED ORDER — ROPINIROLE HCL 0.25 MG PO TABS
0.2500 mg | ORAL_TABLET | Freq: Every day | ORAL | Status: DC
  Administered 2018-05-17 – 2018-05-19 (×3): 0.25 mg via ORAL
  Filled 2018-05-17 (×4): qty 1

## 2018-05-17 MED ORDER — HYDROXYZINE HCL 50 MG PO TABS
50.0000 mg | ORAL_TABLET | Freq: Three times a day (TID) | ORAL | Status: DC | PRN
  Administered 2018-05-19 – 2018-05-20 (×2): 50 mg via ORAL
  Filled 2018-05-17 (×3): qty 1

## 2018-05-17 MED ORDER — LAMOTRIGINE 100 MG PO TABS
100.0000 mg | ORAL_TABLET | Freq: Every day | ORAL | Status: DC
  Administered 2018-05-17 – 2018-05-20 (×4): 100 mg via ORAL
  Filled 2018-05-17 (×4): qty 1

## 2018-05-17 NOTE — Progress Notes (Signed)
In consultation with ED Personal this worker is advising that there might be a suitable group home for this patient upon DC. The contact person is Jacklynn Ganong of [facility] [phone] and their fax number [phone] In patient [facility] SW should complete assessment and Fl2 if group home needed.  This group home provider is available to come and meet patient during the week and weekend.  [employer] LCSW [phone]

## 2018-05-17 NOTE — Progress Notes (Signed)
Patient is depressed and verbalized suicidal thoughts.Contracts for safety in the unit.C/O headache,medicated with Tylenol. Cooperative during admission assessment. Patient denies HI at this time. Patient denies AVH. Patient informed of fall risk status, fall risk assessed "low" at this time. Patient oriented to unit/staff/room. Patient denies any questions/concerns at this time. Patient safe on unit with Q15 minute checks for safety. Skin assessment done,no contraband found.

## 2018-05-17 NOTE — ED Provider Notes (Signed)
[facility] Emergency Department Provider Note ____________________________________________   First MD Initiated Contact with Patient 05/17/18 1419     (approximate)  I have reviewed the triage vital signs and the nursing notes.   HISTORY  Chief Complaint Suicidal  HPI Michael Schmitt is a 48 y.o. male well-known to our service.  Patient presents for evaluation, he reported out friend that he is "suicidal" but to me he reports he is really here because he thinks he needs a new home.  Reports the last psychiatrist he saw told him he could get into a group home, and he thinks he is going to be evicted by his mother and sister in the near future.  He reports he feels like he needs help, he continues to take his medications and reports that he took a couple extra of Luvox earlier today  but he was not trying to hurt himself.  Reports he continues to battle with anxiety.  He also feels depressed.  Denies any actively want to harm himself at this time.  No recent medical illness.  No nausea vomiting.  He would like to see a psychiatrist.  Reports he does not want to harm himself or anyone else at this time.  Primarily fixated on desire to get into a group home.  Past Medical History:  Diagnosis Date  . Anxiety   . Anxiety   . Arthritis    cerv. stenosis, spondylosis, HNP- lower back , has been followed in pain clinic, has  had injection s in cerv. area  . Blood dyscrasia    told that when he was young he was a" free bleeder"  . CAD (coronary artery disease)   . Cervical spondylosis without myelopathy 07/24/2014  . Cervicogenic headache 07/24/2014  . Chronic kidney disease    renal calculi- passed spontaneously  . Depression   . Diabetes mellitus without complication ([place])   . Fatty liver   . GERD (gastroesophageal reflux disease)   . Headache(784.0)   . Hyperlipidemia   . Hypertension   . Mental disorder   . MI, old   . RLS (restless legs syndrome)      detected on sleep study  . Shortness of breath   . Ventricular fibrillation ([place]) 04/05/2016    Patient Active Problem List   Diagnosis Date Noted  . Depression, major, recurrent, severe with psychosis ([place]) 03/30/2018  . Personality disorder ([place]) 12/31/2017  . MDD (major depressive disorder), recurrent episode, severe ([place]) 11/20/2017  . Dysthymia 10/26/2017  . Severe recurrent major depression without psychotic features ([place]) 03/12/2017  . Generalized anxiety disorder 02/05/2017  . Chronic neck pain (Primary Area of Pain) (Left) 11/22/2016  . Cervical facet syndrome (Left) 11/22/2016  . Cervical radiculitis (Left) 11/22/2016  . History of cervical spinal surgery 11/22/2016  . Cervical spondylosis 11/22/2016  . Chronic shoulder pain (Secondary Area of Pain) (Left) 11/22/2016  . Arthropathy of shoulder (Left) 11/22/2016  . Chronic low back pain [facility] Area of Pain) (Bilateral) (L>R) 11/22/2016  . Lumbar facet syndrome (Bilateral) (L>R) 11/22/2016  . Chronic pain syndrome 11/21/2016  . Long term prescription benzodiazepine use 11/21/2016  . Opiate use 11/21/2016  . Uncontrolled type 2 diabetes mellitus without complication, without long-term current use of insulin ([place]) 10/19/2016  . Chronic Suicidal ideation 09/15/2016  . Coronary artery disease 09/12/2016  . HTN (hypertension) 06/26/2016  . GERD (gastroesophageal reflux disease) 06/26/2016  . Alcohol use disorder, severe, 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[place]) 06/26/2016  . Benzodiazepine abuse (  [place]) 06/11/2016  . Sedative, hypnotic or anxiolytic use disorder, severe, dependence ([place]) 05/12/2016  . Ventricular fibrillation ([place]) 04/05/2016  . Combined hyperlipidemia 01/05/2016  . Cervical spondylosis without myelopathy 07/24/2014  . Severe episode of recurrent major depressive disorder, without psychotic features ([place]) 04/15/2013  . Tobacco use disorder 10/20/2010  . Asthma 10/20/2010    Past Surgical History:  Procedure Laterality  Date  . ANTERIOR CERVICAL DECOMP/DISCECTOMY FUSION  11/13/2011   Procedure: ANTERIOR CERVICAL DECOMPRESSION/DISCECTOMY FUSION 1 LEVEL;  Surgeon: [name], MD;  Location: [place] [facility]URO ORS;  Service: Neurosurgery;  Laterality: N/A;  Anterior Cervical Decompression/discectomy Fusion. Cervical three-four.  Marland Kitchen CARDIAC CATHETERIZATION N/A 01/01/2015   Procedure: Left Heart Cath and Coronary Angiography;  Surgeon: [name], MD;  Location: [place] CV LAB;  Service: Cardiovascular;  Laterality: N/A;  . CARDIAC CATHETERIZATION N/A 04/05/2016   Procedure: Left Heart Cath and Coronary Angiography;  Surgeon: Belva Crome, MD;  Location: [place] CV LAB;  Service: Cardiovascular;  Laterality: N/A;  . CARDIAC CATHETERIZATION N/A 04/05/2016   Procedure: Coronary Stent Intervention;  Surgeon: Belva Crome, MD;  Location: [place] CV LAB;  Service: Cardiovascular;  Laterality: N/A;  . CARDIAC CATHETERIZATION N/A 04/05/2016   Procedure: Intravascular Ultrasound/IVUS;  Surgeon: Belva Crome, MD;  Location: [place] CV LAB;  Service: Cardiovascular;  Laterality: N/A;  . NASAL SINUS SURGERY     2005    Prior to Admission medications   Medication Sig Start Date End Date Taking? Authorizing Provider  aspirin 81 MG EC tablet Take 1 tablet (81 mg total) by mouth daily. For heart health 11/21/17   Lindell Spar I, NP  atorvastatin (LIPITOR) 80 MG tablet Take 1 tablet (80 mg total) by mouth daily at 6 PM. For high cholesterol Patient taking differently: Take 80 mg by mouth daily. For high cholesterol 11/21/17   Lindell Spar I, NP  carvedilol (COREG) 6.25 MG tablet Take 1 tablet (6.25 mg total) by mouth 2 (two) times daily with a meal. For high blood pressure 11/21/17   Nwoko, Herbert Pun I, NP  cloNIDine (CATAPRES) 0.1 MG tablet Take 1 tablet (0.1 mg total) by mouth 3 (three) times daily for 30 days. 05/16/18 06/15/18  Lavella Hammock, MD  diphenhydrAMINE (BENADRYL ALLERGY) 25 MG tablet Take 2 tablets (50 mg total) by  mouth at bedtime for 30 days. 05/16/18 06/15/18  Lavella Hammock, MD  divalproex (DEPAKOTE ER) 500 MG 24 hr tablet Take 2 tablets (1,000 mg total) by mouth at bedtime. 05/16/18   Lavella Hammock, MD  fluPHENAZine (PROLIXIN) 5 MG tablet Take 3 tablets (15 mg total) by mouth at bedtime. 05/16/18   Lavella Hammock, MD  fluPHENAZine (PROLIXIN) 5 MG tablet Take 1 tablet (5 mg total) by mouth daily. In the morning 05/17/18   Lavella Hammock, MD  fluvoxaMINE (LUVOX) 100 MG tablet Take 1 tablet (100 mg total) by mouth at bedtime. 05/16/18   Lavella Hammock, MD  gabapentin (NEURONTIN) 300 MG capsule Take 3 capsules (900 mg total) by mouth 3 (three) times daily. 04/01/18   Clapacs, Madie Reno, MD  ibuprofen (ADVIL,MOTRIN) 800 MG tablet Take 2,400 mg by mouth 3 (three) times daily as needed.    [provider]  lamoTRIgine (LAMICTAL) 100 MG tablet Take 1 tablet (100 mg total) by mouth daily. 04/02/18   Clapacs, Madie Reno, MD  metFORMIN (GLUCOPHAGE) 500 MG tablet Take 1 tablet (500 mg total) by mouth 2 (two) times daily with a  meal. For diabetes management 11/21/17   Lindell Spar I, NP  nitroGLYCERIN (NITROSTAT) 0.4 MG SL tablet Place 1 tablet (0.4 mg total) under the tongue every 5 (five) minutes x 3 doses as needed. For chest pain 11/21/17 11/21/18  Lindell Spar I, NP  rOPINIRole (REQUIP) 0.25 MG tablet Take 1 tablet (0.25 mg total) by mouth at bedtime for 30 days. 05/08/18 06/07/18  Lavella Hammock, MD    Allergies Prednisone; Hydrocodone; Trazodone and nefazodone; and Varenicline  Family History  Problem Relation Age of Onset  . Prostate cancer Father   . Hypertension Mother   . Kidney Stones Mother   . Anxiety disorder Mother   . Depression Mother   . COPD Sister   . Hypertension Sister   . Diabetes Sister   . Depression Sister   . Anxiety disorder Sister   . Seizures Sister   . ADD / ADHD Son   . ADD / ADHD Daughter     Social History Social History   Tobacco Use  . Smoking status: Current  Every Day Smoker    Packs/day: 2.00    Years: 17.00    Pack years: 34.00    Types: Cigarettes, Cigars  . Smokeless tobacco: Former Systems developer  . Tobacco comment: occassional snuff   Substance Use Topics  . Alcohol use: No    Alcohol/week: 0.0 standard drinks    Comment: last drinked in 3 months.   . Drug use: No    Review of Systems Constitutional: No fever/chills Eyes: No visual changes. ENT: No sore throat. Cardiovascular: Denies chest pain. Respiratory: Denies shortness of breath. Gastrointestinal: No abdominal pain.   Genitourinary: Negative for dysuria. Musculoskeletal: Negative for back pain. Skin: Negative for rash. Neurological: Negative for headaches, areas of focal weakness or numbness.    ____________________________________________   PHYSICAL EXAM:  VITAL SIGNS: ED Triage Vitals  Enc Vitals Group     BP 05/17/18 1354 113/63     Pulse Rate 05/17/18 1354 85     Resp 05/17/18 1354 16     Temp 05/17/18 1354 97.9 F (36.6 C)     Temp Source 05/17/18 1354 Oral     SpO2 05/17/18 1354 96 %     Weight 05/17/18 1355 211 lb 10.3 oz (96 kg)     Height 05/17/18 1355 6\' 2"  (1.88 m)     Head Circumference --      Peak Flow --      Pain Score 05/17/18 1355 0     Pain Loc --      Pain Edu? --      Excl. in [place]? --     Constitutional: Alert and oriented. Well appearing and in no acute distress. Eyes: Conjunctivae are normal. Head: Atraumatic. Nose: No congestion/rhinnorhea. Mouth/Throat: Mucous membranes are moist. Neck: No stridor.  Cardiovascular: Normal rate, regular rhythm. Grossly normal heart sounds.  Good peripheral circulation. Respiratory: Normal respiratory effort.  No retractions. Lungs CTAB. Gastrointestinal: Soft and nontender. No distention. Musculoskeletal: No lower extremity tenderness nor edema. Neurologic:  Normal speech and language. No gross focal neurologic deficits are appreciated.  Skin:  Skin is warm, dry and intact. No rash  noted. Psychiatric: Mood and affect are normal. Speech and behavior are normal.  ____________________________________________   LABS (all labs ordered are listed, but only abnormal results are displayed)  Labs Reviewed  COMPREHENSIVE METABOLIC PANEL - Abnormal; Notable for the following components:      Result Value   Sodium 133 (*)  CO2 21 (*)    Glucose, Bld 145 (*)    All other components within normal limits  ACETAMINOPHEN LEVEL - Abnormal; Notable for the following components:   Acetaminophen (Tylenol), Serum <10 (*)    All other components within normal limits  CBC - Abnormal; Notable for the following components:   WBC 12.8 (*)    RDW 15.9 (*)    All other components within normal limits  ETHANOL  SALICYLATE LEVEL  URINE DRUG SCREEN, QUALITATIVE ([facility] ONLY)   ____________________________________________  EKG   ____________________________________________  RADIOLOGY   ____________________________________________   PROCEDURES  Procedure(s) performed: None  Procedures  Critical Care performed: No  ____________________________________________   INITIAL IMPRESSION / ASSESSMENT AND PLAN / ED COURSE  Pertinent labs & imaging results that were available during my care of the patient were reviewed by me and considered in my medical decision making (see chart for details).   Patient presents for voluntary evaluation by psychiatry.  Primarily reports to me that he is looking for group home placement, but also reported chronic suicidal thoughts.  Currently reports he does not want to hurt himself or anyone else but mostly wants to find an appropriate place to be possibly placed in a group home, but does also report intermittently has suicidal thoughts on a regular basis.  He is presently resting comfortably and voluntary.  Discussed with [name], he advises they will admit patient to behavioral unit for further evaluation.  Patient is agreeable.       ____________________________________________   FINAL CLINICAL IMPRESSION(S) / ED DIAGNOSES  Final diagnoses:  Severe episode of recurrent major depressive disorder, without psychotic features ([place])        Note:  This document was prepared using Systems analyst and may include unintentional dictation errors       Delman Kitten, MD 05/17/18 1552

## 2018-05-17 NOTE — BH Assessment (Signed)
Patient is to be admitted to [facility] by [name].  Attending Physician will be [name].   Patient has been assigned to room 313, by [place] Nurse T'Yawn.   ER staff is aware of the admission:  Amy T., Patient's Nurse   Butch Penny, Patient Access.

## 2018-05-17 NOTE — ED Notes (Signed)
When asked by this writer what brought him in to the hospital patient stated, "Same stuff."   Pt reported he acted on his SI today by taking pills.  Denies any pain, nausea or other medical complaints at this time.  Pt given ginger ale and TV remote.

## 2018-05-17 NOTE — Progress Notes (Signed)
Patient is in bed with eyes closed and monitored frequently and every 15 minutes, medication compliant , took his medicines without any issues , patient is responding appropriately to assessments , mood is depresses and affect is blunt , patient had refused his dinner tray this evening , saved his meals for later if needed.  Patient contract for safety of self and others, support and encouragement is provided,  Unit safety and expected behaviors are discussed, denies suicide ideations and states that  I'm still having thoughts but did not disclose any plan, patient is monitored more frequently for safety, no distress.

## 2018-05-17 NOTE — Consult Note (Signed)
[facility] Face-to-Face Psychiatry Consult   Reason for Consult: 48 year old man with chronic mental health problems comes voluntarily to the emergency room because of an overdose Referring Physician: Quale Patient Identification: Michael Schmitt MRN:  [id number] Principal Diagnosis: Overdose Diagnosis:  Principal Problem:   Overdose Active Problems:   Chronic Suicidal ideation   Benzodiazepine abuse ([place])   Depression, major, recurrent, severe with psychosis ([place])   Total Time spent with patient: 1 hour  Subjective:   Michael Schmitt is a 49 y.o. male patient admitted with "I took a bunch of pills".  HPI: Patient seen and chart reviewed.  Patient is a chronic mental health client who presents to the emergency room frequently.  He was here just about 2 days ago.  Comes back today stating that he has taken an overdose of Luvox.  He says he is suicidal.  As usual he describes his mood in disastrous terms talking about not wanting to live hating himself etc.  Patient is not reporting any current hallucinations.  Denies alcohol or drug abuse.  Social history: Patient has been living with his sister and his own children.  He tells me today that he does not have a place to live anymore but is vague about why that is.  Medical history: High blood pressure, diabetes chronic pain  Substance abuse history: Patient has a history of heavy alcohol abuse in the past as well as abuse of benzodiazepines.  Poor insight.  Seems to have cut back or stop the drinking however.  Past Psychiatric History: Patient has a long history of mood instability.  Multiple hospitalizations multiple present [place] to the emergency room.  Chronically suicidal.  Does have a history of suicide attempts in the past.  Chronically noncompliant with recommended treatment plan.  Risk to Self: Suicidal Ideation: Yes-Currently Present Suicidal Intent: Yes-Currently Present Is patient at risk for suicide?: Yes Suicidal Plan?:  Yes-Currently Present Specify Current Suicidal Plan: Overdose Access to Means: Yes Specify Access to Suicidal Means: Overdose on medications What has been your use of drugs/alcohol within the last 12 months?: Reports of none How many times?: 2 Other Self Harm Risks: Reports of none Triggers for Past Attempts: None known Intentional Self Injurious Behavior: None Risk to Others: Homicidal Ideation: No Thoughts of Harm to Others: No Current Homicidal Intent: No Current Homicidal Plan: No Access to Homicidal Means: No Identified Victim: Reports of none History of harm to others?: Yes Assessment of Violence: None Noted Violent Behavior Description: Reports of none Does patient have access to weapons?: No Criminal Charges Pending?: No Does patient have a court date: No Prior Inpatient Therapy: Prior Inpatient Therapy: Yes Prior Therapy Dates: Multiple Hospitalizations, most recent 03/2018 Prior Therapy Facilty/Provider(s): [facility] Reason for Treatment: Depression Prior Outpatient Therapy: Prior Outpatient Therapy: Yes Prior Therapy Dates: current Prior Therapy Facilty/Provider(s): [facility] Reason for Treatment: depression Does patient have an ACCT team?: No Does patient have Intensive In-House Services?  : No Does patient have [facility] services? : No Does patient have P4CC services?: No  Past Medical History:  Past Medical History:  Diagnosis Date  . Anxiety   . Anxiety   . Arthritis    cerv. stenosis, spondylosis, HNP- lower back , has been followed in pain clinic, has  had injection s in cerv. area  . Blood dyscrasia    told that when he was young he was a" free bleeder"  . CAD (coronary artery disease)   . Cervical spondylosis without myelopathy 07/24/2014  .  Cervicogenic headache 07/24/2014  . Chronic kidney disease    renal calculi- passed spontaneously  . Depression   . Diabetes mellitus without complication ([place])   . Fatty liver   . GERD (gastroesophageal  reflux disease)   . Headache(784.0)   . Hyperlipidemia   . Hypertension   . Mental disorder   . MI, old   . RLS (restless legs syndrome)    detected on sleep study  . Shortness of breath   . Ventricular fibrillation ([place]) 04/05/2016    Past Surgical History:  Procedure Laterality Date  . ANTERIOR CERVICAL DECOMP/DISCECTOMY FUSION  11/13/2011   Procedure: ANTERIOR CERVICAL DECOMPRESSION/DISCECTOMY FUSION 1 LEVEL;  Surgeon: [name], MD;  Location: [facility];  Service: Neurosurgery;  Laterality: N/A;  Anterior Cervical Decompression/discectomy Fusion. Cervical three-four.  Marland Kitchen CARDIAC CATHETERIZATION N/A 01/01/2015   Procedure: Left Heart Cath and Coronary Angiography;  Surgeon: [name], MD;  Location: [facility];  Service: Cardiovascular;  Laterality: N/A;  . CARDIAC CATHETERIZATION N/A 04/05/2016   Procedure: Left Heart Cath and Coronary Angiography;  Surgeon: Belva Crome, MD;  Location: [facility];  Service: Cardiovascular;  Laterality: N/A;  . CARDIAC CATHETERIZATION N/A 04/05/2016   Procedure: Coronary Stent Intervention;  Surgeon: Belva Crome, MD;  Location: [facility];  Service: Cardiovascular;  Laterality: N/A;  . CARDIAC CATHETERIZATION N/A 04/05/2016   Procedure: Intravascular Ultrasound/IVUS;  Surgeon: Belva Crome, MD;  Location: [facility];  Service: Cardiovascular;  Laterality: N/A;  . NASAL SINUS SURGERY     2005   Family History:  Family History  Problem Relation Age of Onset  . Prostate cancer Father   . Hypertension Mother   . Kidney Stones Mother   . Anxiety disorder Mother   . Depression Mother   . COPD Sister   . Hypertension Sister   . Diabetes Sister   . Depression Sister   . Anxiety disorder Sister   . Seizures Sister   . ADD / ADHD Son   . ADD / ADHD Daughter    Family Psychiatric  History: Anxiety in some family members Social History:  Social History   Substance and Sexual Activity  Alcohol Use No  .  Alcohol/week: 0.0 standard drinks   Comment: last drinked in 3 months.      Social History   Substance and Sexual Activity  Drug Use No    Social History   Socioeconomic History  . Marital status: Single    Spouse name: Not on file  . Number of children: 3  . Years of education: 25  . Highest education level: Not on file  Occupational History    Comment: unemployed  Social Needs  . Financial resource strain: Not on file  . Food insecurity:    Worry: Not on file    Inability: Not on file  . Transportation needs:    Medical: Not on file    Non-medical: Not on file  Tobacco Use  . Smoking status: Current Every Day Smoker    Packs/day: 2.00    Years: 17.00    Pack years: 34.00    Types: Cigarettes, Cigars  . Smokeless tobacco: Former Systems developer  . Tobacco comment: occassional snuff   Substance and Sexual Activity  . Alcohol use: No    Alcohol/week: 0.0 standard drinks    Comment: last drinked in 3 months.   . Drug use: No  . Sexual activity: Not Currently  Lifestyle  .  Physical activity:    Days per week: Not on file    Minutes per session: Not on file  . Stress: Not on file  Relationships  . Social connections:    Talks on phone: Not on file    Gets together: Not on file    Attends religious service: Not on file    Active member of club or organization: Not on file    Attends meetings of clubs or organizations: Not on file    Relationship status: Not on file  Other Topics Concern  . Not on file  Social History Narrative   Patient is right handed.   Patient drinks 2 sodas daily.   Additional Social History:    Allergies:   Allergies  Allergen Reactions  . Prednisone Other (See Comments)    Hypertension and makes him feel "spacey"  . Hydrocodone Other (See Comments)    Causes headaches and makes the patient irritable  . Trazodone And Nefazodone Other (See Comments)    Patient stated that this keeps him awake; "has an opposite effect on me"  . Varenicline  Other (See Comments)    Suicidal thoughts    Labs:  Results for orders placed or performed during the hospital encounter of 05/17/18 (from the past 48 hour(s))  Comprehensive metabolic panel     Status: Abnormal   Collection Time: 05/17/18  1:59 PM  Result Value Ref Range   Sodium 133 (L) 135 - 145 mmol/L   Potassium 4.3 3.5 - 5.1 mmol/L   Chloride 100 98 - 111 mmol/L   CO2 21 (L) 22 - 32 mmol/L   Glucose, Bld 145 (H) 70 - 99 mg/dL   BUN 7 6 - 20 mg/dL   Creatinine, Ser 0.94 0.61 - 1.24 mg/dL   Calcium 9.5 8.9 - 10.3 mg/dL   Total Protein 7.3 6.5 - 8.1 g/dL   Albumin 4.2 3.5 - 5.0 g/dL   AST 26 15 - 41 U/L   ALT 22 0 - 44 U/L   Alkaline Phosphatase 107 38 - 126 U/L   Total Bilirubin 0.5 0.3 - 1.2 mg/dL   GFR calc non Af Amer >60 >60 mL/min   GFR calc Af Amer >60 >60 mL/min   Anion gap 12 5 - 15    Comment: Performed at [facility], [address], [place], [place] [address]  Ethanol     Status: None   Collection Time: 05/17/18  1:59 PM  Result Value Ref Range   Alcohol, Ethyl (B) <10 <10 mg/dL    Comment: (NOTE) Lowest detectable limit for serum alcohol is 10 mg/dL. For medical purposes only. Performed at [facility], [address] [place], [place] [address]   Salicylate level     Status: None   Collection Time: 05/17/18  1:59 PM  Result Value Ref Range   Salicylate Lvl <0.9 2.8 - 30.0 mg/dL    Comment: Performed at [facility], [address] [place],  [address]  Acetaminophen level     Status: Abnormal   Collection Time: 05/17/18  1:59 PM  Result Value Ref Range   Acetaminophen (Tylenol), Serum <10 (L) 10 - 30 ug/mL    Comment: (NOTE) Therapeutic concentrations vary significantly. A range of 10-30 ug/mL  may be an effective concentration for many patients. However, some  are best treated at concentrations outside of this range. Acetaminophen concentrations >150 ug/mL at 4 hours after ingestion  and >50 ug/mL at 12 hours  after ingestion  are often associated with  toxic reactions. Performed at [facility], [address] [place], [place] [address]   cbc     Status: Abnormal   Collection Time: 05/17/18  1:59 PM  Result Value Ref Range   WBC 12.8 (H) 4.0 - 10.5 K/uL   RBC 4.82 4.22 - 5.81 MIL/uL   Hemoglobin 15.4 13.0 - 17.0 g/dL   HCT 44.7 39.0 - 52.0 %   MCV 92.7 80.0 - 100.0 fL   MCH 32.0 26.0 - 34.0 pg   MCHC 34.5 30.0 - 36.0 g/dL   RDW 15.9 (H) 11.5 - 15.5 %   Platelets 253 150 - 400 K/uL   nRBC 0.2 0.0 - 0.2 %    Comment: Performed at [facility], [address] [place], [place] [address]    No current facility-administered medications for this encounter.    Current Outpatient Medications  Medication Sig Dispense Refill  . aspirin 81 MG EC tablet Take 1 tablet (81 mg total) by mouth daily. For heart health 30 tablet 1  . atorvastatin (LIPITOR) 80 MG tablet Take 1 tablet (80 mg total) by mouth daily at 6 PM. For high cholesterol (Patient taking differently: Take 80 mg by mouth daily. For high cholesterol) 30 tablet 0  . carvedilol (COREG) 6.25 MG tablet Take 1 tablet (6.25 mg total) by mouth 2 (two) times daily with a meal. For high blood pressure 60 tablet 0  . cloNIDine (CATAPRES) 0.1 MG tablet Take 1 tablet (0.1 mg total) by mouth 3 (three) times daily for 30 days. 90 tablet 0  . diphenhydrAMINE (BENADRYL ALLERGY) 25 MG tablet Take 2 tablets (50 mg total) by mouth at bedtime for 30 days. 60 tablet 0  . divalproex (DEPAKOTE ER) 500 MG 24 hr tablet Take 2 tablets (1,000 mg total) by mouth at bedtime. 60 tablet 0  . fluPHENAZine (PROLIXIN) 5 MG tablet Take 3 tablets (15 mg total) by mouth at bedtime. 90 tablet 0  . fluPHENAZine (PROLIXIN) 5 MG tablet Take 1 tablet (5 mg total) by mouth daily. In the morning 30 tablet 0  . fluvoxaMINE (LUVOX) 100 MG tablet Take 1 tablet (100 mg total) by mouth at bedtime. 30 tablet 0  . gabapentin (NEURONTIN) 300 MG capsule Take 3 capsules  (900 mg total) by mouth 3 (three) times daily. 270 capsule 0  . ibuprofen (ADVIL,MOTRIN) 800 MG tablet Take 2,400 mg by mouth 3 (three) times daily as needed.    . lamoTRIgine (LAMICTAL) 100 MG tablet Take 1 tablet (100 mg total) by mouth daily. 30 tablet 0  . metFORMIN (GLUCOPHAGE) 500 MG tablet Take 1 tablet (500 mg total) by mouth 2 (two) times daily with a meal. For diabetes management    . nitroGLYCERIN (NITROSTAT) 0.4 MG SL tablet Place 1 tablet (0.4 mg total) under the tongue every 5 (five) minutes x 3 doses as needed. For chest pain 10 tablet 0  . rOPINIRole (REQUIP) 0.25 MG tablet Take 1 tablet (0.25 mg total) by mouth at bedtime for 30 days. 30 tablet 1    Musculoskeletal: Strength & Muscle Tone: within normal limits Gait & Station: normal Patient leans: N/A  Psychiatric Specialty Exam: Physical Exam  Nursing note and vitals reviewed. Constitutional: He appears well-developed and well-nourished.  HENT:  Head: Normocephalic and atraumatic.  Eyes: Pupils are equal, round, and reactive to light. Conjunctivae are normal.  Neck: Normal range of motion.  Cardiovascular: Regular rhythm and normal heart sounds.  Respiratory: Effort normal.  No respiratory distress.  GI: Soft.  Musculoskeletal: Normal range of motion.  Neurological: He is alert.  Skin: Skin is warm and dry.  Psychiatric: His speech is delayed. He is slowed. Cognition and memory are impaired. He expresses impulsivity. He exhibits a depressed mood. He expresses suicidal ideation. He expresses suicidal plans.    Review of Systems  Constitutional: Negative.   HENT: Negative.   Eyes: Negative.   Respiratory: Negative.   Cardiovascular: Negative.   Gastrointestinal: Negative.   Musculoskeletal: Negative.   Skin: Negative.   Neurological: Negative.   Psychiatric/Behavioral: Positive for depression and suicidal ideas.    Blood pressure 113/63, pulse 85, temperature 97.9 F (36.6 C), temperature source Oral, resp.  rate 16, height 6\' 2"  (1.88 m), weight 96 kg, SpO2 96 %.Body mass index is 27.17 kg/m.  General Appearance: Disheveled  Eye Contact:  Minimal  Speech:  Slow  Volume:  Decreased  Mood:  Depressed  Affect:  Congruent  Thought Process:  Coherent  Orientation:  Full (Time, Place, and Person)  Thought Content:  Logical  Suicidal Thoughts:  Yes.  with intent/plan  Homicidal Thoughts:  No  Memory:  Immediate;   Fair Recent;   Fair Remote;   Fair  Judgement:  Impaired  Insight:  Shallow  Psychomotor Activity:  Decreased  Concentration:  Concentration: Poor  Recall:  Poor  Fund of Knowledge:  Poor  Language:  Poor  Akathisia:  No  Handed:  Right  AIMS (if indicated):     Assets:  Desire for Improvement  ADL's:  Impaired  Cognition:  Impaired,  Mild  Sleep:        Treatment Plan Summary: Daily contact with patient to assess and evaluate symptoms and progress in treatment, Medication management and Plan Patient presents claiming he took an overdose of Luvox.  Does not seem to be in any physical distress.  Lab work-up pretty unremarkable.  As usual he says he is suicidal.  Last time he was in the emergency room the on-call psychiatrist told him that she would put him in a group home if he came back again.  Today he tells me that he wants to be placed in a group home.  Under the circumstances we will admit him to the psychiatric ward at this time with the intention of trying to stabilize him and get him into a safer outpatient living environment.  Disposition: Recommend psychiatric Inpatient admission when medically cleared.  Alethia Berthold, MD 05/17/2018 4:17 PM

## 2018-05-17 NOTE — Plan of Care (Signed)
  Problem: Education: Goal: Knowledge of [facility] Education information/materials will improve Outcome: Progressing Goal: Emotional status will improve Outcome: Progressing Goal: Mental status will improve Outcome: Progressing Goal: Verbalization of understanding the information provided will improve Outcome: Progressing   Problem: Health Behavior/Discharge Planning: Goal: Identification of resources available to assist in meeting health care needs will improve Outcome: Progressing Goal: Compliance with treatment plan for underlying cause of condition will improve Outcome: Progressing   Problem: Coping: Goal: Coping ability will improve Outcome: Progressing Goal: Will verbalize feelings Outcome: Progressing   Problem: Role Relationship: Goal: Will demonstrate positive changes in social behaviors and relationships Outcome: Progressing   Problem: Self-Concept: Goal: Will verbalize positive feelings about self Outcome: Progressing Goal: Level of anxiety will decrease Outcome: Progressing

## 2018-05-17 NOTE — Tx Team (Signed)
Initial Treatment Plan 05/17/2018 6:11 PM Shirl Ludington [id number]    PATIENT STRESSORS: Financial difficulties Marital or family conflict Medication change or noncompliance   PATIENT STRENGTHS: Average or above average intelligence Communication skills Supportive family/friends   PATIENT IDENTIFIED PROBLEMS: Depression 05/17/2018  Suicidal thoughts 05/17/2018                   DISCHARGE CRITERIA:  Ability to meet basic life and health needs Medical problems require only outpatient monitoring Verbal commitment to aftercare and medication compliance  PRELIMINARY DISCHARGE PLAN: Attend aftercare/continuing care group Placement in alternative living arrangements  PATIENT/FAMILY INVOLVEMENT: This treatment plan has been presented to and reviewed with the patient, Michael Schmitt, and/or family member,.  The patient and family have been given the opportunity to ask questions and make suggestions.  Merlene Morse, RN 05/17/2018, 6:11 PM

## 2018-05-17 NOTE — ED Notes (Signed)
Pt occassionally yelling "help" from his room.  Also observed slapping himself in the face with open hand.  Maintained on 15 minute checks.
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
## 2018-05-17 NOTE — Progress Notes (Signed)
LCSW spoke for a bit with patients sister. After much upset his sister reports that the patient is not able to return to the house and states he needs a group home at this time. He calls out for family members through out the night, self abuses and then has many suicide attempts and the mother and sister are fed up.  In discussion with family- patient is attached to  [employer] but has not seen his attached therapist. Sister reported patient does take his medications- however he still hits himself and has episodes of anger and extreme anxiety and yells out for no reason or provocation.   This worker is available this weekend to assist with Group home Provider- refer to previous notes.  [employer] LCSW [phone]

## 2018-05-17 NOTE — ED Triage Notes (Addendum)
ARrives with sister c/o being suicidal x 1 week.  States he took "a bunch of Lovox" today at 1000.  Patient states he can't remember if he took any other pills.  Patient asking who the doctor is today.  Patient states he needs to be put into a group home.  Patient just discharged from ED for same complaint yesterday.  States that the physician who saw him in ED said that if he came back, she would place him in a group home.  Patient is awake and alert. Calm and cooperative at this time.

## 2018-05-17 NOTE — ED Notes (Signed)
BEHAVIORAL HEALTH ROUNDING Patient sleeping: No. Patient alert and oriented: yes Behavior appropriate: Yes.  ; If no, describe:  Nutrition and fluids offered: yes Toileting and hygiene offered: Yes  Sitter present: q15 minute observations and security  monitoring Law enforcement present: Yes  ODS  

## 2018-05-18 ENCOUNTER — Encounter: Payer: Self-pay | Admitting: Psychiatry

## 2018-05-18 DIAGNOSIS — F332 Major depressive disorder, recurrent severe without psychotic features: Principal | ICD-10-CM

## 2018-05-18 LAB — LIPID PANEL
Cholesterol: 131 mg/dL (ref 0–200)
HDL: 22 mg/dL — ABNORMAL LOW (ref >40)
LDL Cholesterol: UNDETERMINED mg/dL (ref 0–99)
Total CHOL/HDL Ratio: 6 RATIO
Triglycerides: 415 mg/dL — ABNORMAL HIGH (ref <150)
VLDL: UNDETERMINED mg/dL (ref 0–40)

## 2018-05-18 LAB — HEMOGLOBIN A1C
Hgb A1c MFr Bld: 7.4 % — ABNORMAL HIGH (ref 4.8–5.6)
Mean Plasma Glucose: 165.68 mg/dL

## 2018-05-18 LAB — GLUCOSE, CAPILLARY: Glucose-Capillary: 170 mg/dL — ABNORMAL HIGH (ref 70–99)

## 2018-05-18 LAB — TSH: TSH: 0.641 u[IU]/mL (ref 0.350–4.500)

## 2018-05-18 MED ORDER — CARVEDILOL 6.25 MG PO TABS
3.1250 mg | ORAL_TABLET | Freq: Two times a day (BID) | ORAL | Status: DC
  Administered 2018-05-18 – 2018-05-20 (×4): 3.125 mg via ORAL
  Filled 2018-05-18 (×3): qty 1
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
## 2018-05-18 NOTE — Progress Notes (Signed)
MD notified of held medications. No new orders.

## 2018-05-18 NOTE — Plan of Care (Signed)
Pt. Is complaint with medications. Pt. Endorses a highly depressed and anxious mood. Pt. Endorses passive suicidal thoughts, but can contract for safety. Pt. Coping not poor.    Problem: Education: Goal: Emotional status will improve Outcome: Not Progressing Goal: Mental status will improve Outcome: Not Progressing   Problem: Coping: Goal: Coping ability will improve Outcome: Not Progressing   Problem: Health Behavior/Discharge Planning: Goal: Compliance with treatment plan for underlying cause of condition will improve Outcome: Progressing

## 2018-05-18 NOTE — Progress Notes (Addendum)
D: Pt during assessments endorses passive suicidal thoughts, but able to contract for safety on the unit. Pt. Reports neck pain that is being addressed utilizing MD orders. Pt. Endorses a highly depressed and anxious mood. Pt. Affect flat.   A: Q x 15 minute observation checks in place safety. Patient was provided with education. Patient was given/offered medications per orders. Patient  was encourage to attend groups, participate in unit activities and continue with plan of care. Pt. Chart and plans of care reviewed. Pt. Given support and encouragement.   R: Patient is complaint with medications and unit procedures this morning. Pt. Appetite fair, not eating a while lot. Medications held and notified weekend MD of this for safety.             Precautionary checks every 15 minutes for safety in place, room free of safety hazards.

## 2018-05-18 NOTE — BHH Group Notes (Signed)
LCSW Group Therapy Note   05/18/2018 1:15pm   Type of Therapy and Topic:  Group Therapy:  Trust and Honesty  Participation Level:  Did Not Attend  Description of Group:    In this group patients will be asked to explore the value of being honest.  Patients will be guided to discuss their thoughts, feelings, and behaviors related to honesty and trusting in others. Patients will process together how trust and honesty relate to forming relationships with peers, family members, and self. Each patient will be challenged to identify and express feelings of being vulnerable. Patients will discuss reasons why people are dishonest and identify alternative outcomes if one was truthful (to self or others). This group will be process-oriented, with patients participating in exploration of their own experiences, giving and receiving support, and processing challenge from other group members.   Therapeutic Goals: 1. Patient will identify why honesty is important to relationships and how honesty overall affects relationships.  2. Patient will identify a situation where they lied or were lied too and the  feelings, thought process, and behaviors surrounding the situation 3. Patient will identify the meaning of being vulnerable, how that feels, and how that correlates to being honest with self and others. 4. Patient will identify situations where they could have told the truth, but instead lied and explain reasons of dishonesty.   Summary of Patient Progress Pt was invited to attend group but chose not to attend. CSW will continue to encourage pt to attend group throughout their admission.     Therapeutic Modalities:   Cognitive Behavioral Therapy Solution Focused Therapy Motivational Interviewing Brief Therapy  Callyn Severtson  CUEBAS-COLON, LCSW 05/18/2018 12:30 PM

## 2018-05-18 NOTE — Progress Notes (Signed)
Patient is medication compliant, stable and alert , up for meals and vital signs , mood is sad and guarded and affect is blunt, but patient contract for safety of self and others , patient is not socializing and not participating in any leisure activities at this time .  Patient is made aware of  unit safety and expected behaviors, support and encouragement is provided as well, appetite is good and sleep is continuous with out requiring any sleep aid. 15 minute rounding is maintained and patient denies any SI/HI/AVH no distress .

## 2018-05-18 NOTE — H&P (Signed)
Psychiatric Admission Assessment Adult  Patient Identification: Michael Schmitt MRN:  [id number] Date of Evaluation:  05/18/2018 Chief Complaint:  Bipolar Principal Diagnosis: Severe recurrent major depression without psychotic features ([place]) Diagnosis:  Active Problems:   Severe recurrent major depression without psychotic features ([place])  History of Present Illness: 48yo  WM with MDD, hx of alcohol use disorder, presented in ED s/p self-reported suicidal gesture by taking "a bunch of pills".  Michael Schmitt was just in our ED a few days ago for similar SI.    Michael Schmitt is seen in his room. Michael Schmitt is irritable.  Michael Schmitt said that Darly Fails has been depressed for a long time, but doesn't want to elaborate.  Michael Schmitt said that Mellody Masri lives with his mom, sister and 2 of his own kids, and stated that Michael Schmitt wants to be placed in a Group Home.  When asked why, Michael Schmitt refused to explained.  When asked if Lister Brizzi has ever lived in a [facility], Michael Schmitt denied.   Of note, Michael Schmitt told Michael Schmitt yesterday that Michael Schmitt could not live with his family anymore, and refuse to elaborate at that time as well.   Regarding his overdose, Michael Schmitt said that Michael Schmitt took it because Michael Schmitt wants to get some sleep, and said that Michael Schmitt did not want to die because "I have 3 kids".   Michael Schmitt is quite guarded with anything stress or triggers at this time.    Michael Schmitt denied alcohol and drug use.   Associated Signs/Symptoms: Depression Symptoms:  depressed mood, anhedonia, (Hypo) Manic Symptoms:  Irritable Mood, Anxiety Symptoms:  Social Anxiety, Psychotic Symptoms:  denied PTSD Symptoms: NA Total Time spent with patient: 45 minutes  Past Psychiatric History: Patient has a long history of mood instability.  Multiple hospitalizations multiple present [place] to the emergency room.  Chronically suicidal.  Does have a history of suicide attempts in the past.  Chronically noncompliant with recommended treatment plan.  Is the patient at risk to self? Yes.    Has the patient been a risk to self in the past 6 months?  Yes.    Has the patient been a risk to self within the distant past? Yes.    Is the patient a risk to others? No.  Has the patient been a risk to others in the past 6 months? No.  Has the patient been a risk to others within the distant past? No.   Prior Inpatient Therapy:   Prior Outpatient Therapy:    Alcohol Screening: 1. How often do you have a drink containing alcohol?: Never 2. How many drinks containing alcohol do you have on a typical day when you are drinking?: 1 or 2 3. How often do you have six or more drinks on one occasion?: Never AUDIT-C Score: 0 4. How often during the last year have you found that you were not able to stop drinking once you had started?: Never 5. How often during the last year have you failed to do what was normally expected from you becasue of drinking?: Never 6. How often during the last year have you needed a first drink in the morning to get yourself going after a heavy drinking session?: Never 7. How often during the last year have you had a feeling of guilt of remorse after drinking?: Never 8. How often during the last year have you been unable to remember what happened the night before because you had been drinking?: Never 9. Have you or someone else been injured as a result of your drinking?:  No 10. Has a relative or friend or a doctor or another health worker been concerned about your drinking or suggested you cut down?: No Alcohol Use Disorder Identification Test Final Score (AUDIT): 0 Alcohol Brief Interventions/Follow-up: AUDIT Score <7 follow-up not indicated Substance Abuse History in the last 12 months:  Yes.   Consequences of Substance Abuse: Negative Previous Psychotropic Medications: Yes  Psychological Evaluations: Yes  Past Medical History:  Past Medical History:  Diagnosis Date  . Anxiety   . Anxiety   . Arthritis    cerv. stenosis, spondylosis, HNP- lower back , has been followed in pain clinic, has  had injection s in cerv. area   . Blood dyscrasia    told that when Shelbee Apgar was young Chloeann Alfred was a" free bleeder"  . CAD (coronary artery disease)   . Cervical spondylosis without myelopathy 07/24/2014  . Cervicogenic headache 07/24/2014  . Chronic kidney disease    renal calculi- passed spontaneously  . Depression   . Diabetes mellitus without complication ([place])   . Fatty liver   . GERD (gastroesophageal reflux disease)   . Headache(784.0)   . Hyperlipidemia   . Hypertension   . Mental disorder   . MI, old   . RLS (restless legs syndrome)    detected on sleep study  . Shortness of breath   . Ventricular fibrillation ([place]) 04/05/2016    Past Surgical History:  Procedure Laterality Date  . ANTERIOR CERVICAL DECOMP/DISCECTOMY FUSION  11/13/2011   Procedure: ANTERIOR CERVICAL DECOMPRESSION/DISCECTOMY FUSION 1 LEVEL;  Surgeon: [name], MD;  Location: [facility];  Service: Neurosurgery;  Laterality: N/A;  Anterior Cervical Decompression/discectomy Fusion. Cervical three-four.  Marland Kitchen CARDIAC CATHETERIZATION N/A 01/01/2015   Procedure: Left Heart Cath and Coronary Angiography;  Surgeon: [name], MD;  Location: [facility];  Service: Cardiovascular;  Laterality: N/A;  . CARDIAC CATHETERIZATION N/A 04/05/2016   Procedure: Left Heart Cath and Coronary Angiography;  Surgeon: Belva Crome, MD;  Location: [facility];  Service: Cardiovascular;  Laterality: N/A;  . CARDIAC CATHETERIZATION N/A 04/05/2016   Procedure: Coronary Stent Intervention;  Surgeon: Belva Crome, MD;  Location: [facility];  Service: Cardiovascular;  Laterality: N/A;  . CARDIAC CATHETERIZATION N/A 04/05/2016   Procedure: Intravascular Ultrasound/IVUS;  Surgeon: Belva Crome, MD;  Location: [facility];  Service: Cardiovascular;  Laterality: N/A;  . NASAL SINUS SURGERY     2005   Family History:  Family History  Problem Relation Age of Onset  . Prostate cancer Father   . Hypertension Mother   . Kidney Stones Mother   .  Anxiety disorder Mother   . Depression Mother   . COPD Sister   . Hypertension Sister   . Diabetes Sister   . Depression Sister   . Anxiety disorder Sister   . Seizures Sister   . ADD / ADHD Son   . ADD / ADHD Daughter    Family Psychiatric  History:  Tobacco Screening: Have you used any form of tobacco in the last 30 days? (Cigarettes, Smokeless Tobacco, Cigars, and/or Pipes): Yes Tobacco use, Select all that apply: 5 or more cigarettes per day Are you interested in Tobacco Cessation Medications?: Yes, will notify MD for an order Counseled patient on smoking cessation including recognizing danger situations, developing coping skills and basic information about quitting provided: Yes Social History:  Social History   Substance and Sexual Activity  Alcohol Use No  . Alcohol/week: 0.0 standard  drinks   Comment: last drinked in 3 months.      Social History   Substance and Sexual Activity  Drug Use No    Additional Social History:  Allergies:   Allergies  Allergen Reactions  . Prednisone Other (See Comments)    Hypertension and makes him feel "spacey"  . Hydrocodone Other (See Comments)    Causes headaches and makes the patient irritable  . Trazodone And Nefazodone Other (See Comments)    Patient stated that this keeps him awake; "has an opposite effect on me"  . Varenicline Other (See Comments)    Suicidal thoughts   Lab Results:  Results for orders placed or performed during the hospital encounter of 05/17/18 (from the past 48 hour(s))  Glucose, capillary     Status: Abnormal   Collection Time: 05/17/18  5:35 PM  Result Value Ref Range   Glucose-Capillary 159 (H) 70 - 99 mg/dL  Lipid panel     Status: Abnormal   Collection Time: 05/18/18  6:55 AM  Result Value Ref Range   Cholesterol 131 0 - 200 mg/dL   Triglycerides 415 (H) <150 mg/dL   HDL 22 (L) >40 mg/dL   Total CHOL/HDL Ratio 6.0 RATIO   VLDL UNABLE TO CALCULATE IF TRIGLYCERIDE OVER 400 mg/dL 0 - 40 mg/dL    LDL Cholesterol UNABLE TO CALCULATE IF TRIGLYCERIDE OVER 400 mg/dL 0 - 99 mg/dL    Comment:        Total Cholesterol/HDL:CHD Risk Coronary Heart Disease Risk Table                     Men   Women  1/2 Average Risk   3.4   3.3  Average Risk       5.0   4.4  2 X Average Risk   9.6   7.1  3 X Average Risk  23.4   11.0        Use the calculated Patient Ratio above and the CHD Risk Table to determine the patient's CHD Risk.        ATP III CLASSIFICATION (LDL):  <100     mg/dL   Optimal  100-129  mg/dL   Near or Above                    Optimal  130-159  mg/dL   Borderline  160-189  mg/dL   High  >190     mg/dL   Very High Performed at [facility], [address] [place], [place] [address]   TSH     Status: None   Collection Time: 05/18/18  6:55 AM  Result Value Ref Range   TSH 0.641 0.350 - 4.500 uIU/mL    Comment: Performed by a 3rd Generation assay with a functional sensitivity of <=0.01 uIU/mL. Performed at [facility], [address] [place], [place] [address]   Glucose, capillary     Status: Abnormal   Collection Time: 05/18/18  7:06 AM  Result Value Ref Range   Glucose-Capillary 170 (H) 70 - 99 mg/dL    Blood Alcohol level:  Lab Results  Component Value Date   ETH <10 05/17/2018   ETH <10 11/94/1740    Metabolic Disorder Labs:  Lab Results  Component Value Date   HGBA1C 6.9 (H) 03/31/2018   MPG 151.33 03/31/2018   MPG 126 03/13/2017   Lab Results  Component Value Date   PROLACTIN 18.1 (H) 09/12/2016   PROLACTIN  6.5 05/27/2016   Lab Results  Component Value Date   CHOL 131 05/18/2018   TRIG 415 (H) 05/18/2018   HDL 22 (L) 05/18/2018   CHOLHDL 6.0 05/18/2018   VLDL UNABLE TO CALCULATE IF TRIGLYCERIDE OVER 400 mg/dL 05/18/2018   LDLCALC UNABLE TO CALCULATE IF TRIGLYCERIDE OVER 400 mg/dL 05/18/2018   LDLCALC UNABLE TO CALCULATE IF TRIGLYCERIDE OVER 400 mg/dL 03/31/2018    Current Medications: Current Facility-Administered  Medications  Medication Dose Route Frequency Provider Last Rate Last Dose  . acetaminophen (TYLENOL) tablet 650 mg  650 mg Oral Q6H PRN Clapacs, Madie Reno, MD   650 mg at 05/18/18 0747  . alum & mag hydroxide-simeth (MAALOX/MYLANTA) 200-200-20 MG/5ML suspension 30 mL  30 mL Oral Q4H PRN Clapacs, John T, MD      . aspirin EC tablet 81 mg  81 mg Oral Schmitt Clapacs, Madie Reno, MD   81 mg at 05/18/18 0747  . atorvastatin (LIPITOR) tablet 80 mg  80 mg Oral q1800 Clapacs, Madie Reno, MD   80 mg at 05/17/18 2142  . carvedilol (COREG) tablet 6.25 mg  6.25 mg Oral BID WC Clapacs, Madie Reno, MD   6.25 mg at 05/17/18 2141  . diphenhydrAMINE (BENADRYL) capsule 50 mg  50 mg Oral QHS Clapacs, Madie Reno, MD   50 mg at 05/17/18 2142  . divalproex (DEPAKOTE ER) 24 hr tablet 1,000 mg  1,000 mg Oral QHS Clapacs, John T, MD   1,000 mg at 05/17/18 2142  . fluPHENAZine (PROLIXIN) tablet 15 mg  15 mg Oral QHS Clapacs, John T, MD   15 mg at 05/17/18 2200  . fluPHENAZine (PROLIXIN) tablet 5 mg  5 mg Oral Schmitt Clapacs, Madie Reno, MD   5 mg at 05/18/18 0748  . fluvoxaMINE (LUVOX) tablet 100 mg  100 mg Oral QHS Clapacs, John T, MD   100 mg at 05/17/18 2142  . gabapentin (NEURONTIN) capsule 900 mg  900 mg Oral TID Clapacs, Madie Reno, MD   900 mg at 05/18/18 1138  . hydrOXYzine (ATARAX/VISTARIL) tablet 50 mg  50 mg Oral TID PRN Clapacs, John T, MD      . lamoTRIgine (LAMICTAL) tablet 100 mg  100 mg Oral Schmitt Clapacs, Madie Reno, MD   100 mg at 05/18/18 0748  . magnesium hydroxide (MILK OF MAGNESIA) suspension 30 mL  30 mL Oral Schmitt PRN Clapacs, John T, MD      . metFORMIN (GLUCOPHAGE) tablet 500 mg  500 mg Oral BID WC Clapacs, Madie Reno, MD   500 mg at 05/18/18 0747  . rOPINIRole (REQUIP) tablet 0.25 mg  0.25 mg Oral QHS Clapacs, John T, MD   0.25 mg at 05/17/18 2200   PTA Medications: Medications Prior to Admission  Medication Sig Dispense Refill Last Dose  . aspirin 81 MG EC tablet Take 1 tablet (81 mg total) by mouth Schmitt. For heart health 30  tablet 1 Unknown at Unknown  . atorvastatin (LIPITOR) 80 MG tablet Take 1 tablet (80 mg total) by mouth Schmitt at 6 PM. For high cholesterol (Patient taking differently: Take 80 mg by mouth Schmitt. For high cholesterol) 30 tablet 0 Unknown at Unknown  . carvedilol (COREG) 6.25 MG tablet Take 1 tablet (6.25 mg total) by mouth 2 (two) times Schmitt with a meal. For high blood pressure 60 tablet 0 Unknown at Unknown  . cloNIDine (CATAPRES) 0.1 MG tablet Take 1 tablet (0.1 mg total) by mouth 3 (three) times Schmitt for 30 days. 90 tablet  0   . diphenhydrAMINE (BENADRYL ALLERGY) 25 MG tablet Take 2 tablets (50 mg total) by mouth at bedtime for 30 days. 60 tablet 0   . divalproex (DEPAKOTE ER) 500 MG 24 hr tablet Take 2 tablets (1,000 mg total) by mouth at bedtime. 60 tablet 0   . fluPHENAZine (PROLIXIN) 5 MG tablet Take 3 tablets (15 mg total) by mouth at bedtime. 90 tablet 0   . fluPHENAZine (PROLIXIN) 5 MG tablet Take 1 tablet (5 mg total) by mouth Schmitt. In the morning 30 tablet 0   . fluvoxaMINE (LUVOX) 100 MG tablet Take 1 tablet (100 mg total) by mouth at bedtime. 30 tablet 0   . gabapentin (NEURONTIN) 300 MG capsule Take 3 capsules (900 mg total) by mouth 3 (three) times Schmitt. 270 capsule 0   . ibuprofen (ADVIL,MOTRIN) 800 MG tablet Take 2,400 mg by mouth 3 (three) times Schmitt as needed.   prn at prn  . lamoTRIgine (LAMICTAL) 100 MG tablet Take 1 tablet (100 mg total) by mouth Schmitt. 30 tablet 0   . metFORMIN (GLUCOPHAGE) 500 MG tablet Take 1 tablet (500 mg total) by mouth 2 (two) times Schmitt with a meal. For diabetes management   Unknown at Unknown  . nitroGLYCERIN (NITROSTAT) 0.4 MG SL tablet Place 1 tablet (0.4 mg total) under the tongue every 5 (five) minutes x 3 doses as needed. For chest pain 10 tablet 0 Not Taking at Unknown time  . rOPINIRole (REQUIP) 0.25 MG tablet Take 1 tablet (0.25 mg total) by mouth at bedtime for 30 days. 30 tablet 1     Musculoskeletal: Strength & Muscle Tone: within  normal limits Gait & Station: normal Patient leans: N/A  Psychiatric Specialty Exam: Physical Exam  ROS  Blood pressure 107/67, pulse (!) 56, temperature 97.7 F (36.5 C), temperature source Oral, resp. rate 18, height 6\' 2"  (1.88 m), weight 88 kg, SpO2 96 %.Body mass index is 24.91 kg/m.  General Appearance: Guarded  Eye Contact:  Minimal  Speech:  Clear and Coherent  Volume:  Normal  Mood:  Depressed  Affect:  Blunt  Thought Process:  Goal Directed  Orientation:  Full (Time, Place, and Person)  Thought Content:  Logical  Suicidal Thoughts:  Yes.  with intent/plan  Homicidal Thoughts:  No  Memory:  Immediate;   Fair  Judgement:  Impaired  Insight:  Lacking  Psychomotor Activity:  Decreased  Concentration:  Concentration: Fair and Attention Span: Fair  Recall:  [employer] of Knowledge:  Fair  Language:  Fair  Akathisia:  Negative  Handed:    AIMS (if indicated):     Assets:  Physical Health  ADL's:  Intact  Cognition:  WNL  Sleep:       Treatment Plan Summary: Schmitt contact with patient to assess and evaluate symptoms and progress in treatment and Medication management   Plan: 48yo WM with likely MDD, remote hx of alcohol and BZD abuse, presented in ED frequently for worsening depression and SI, with vague overdose gestures.  Creg Gilmer likely has housing difficulty with his family, and currently is interested in [facility] placement. However, Elizah Mierzwa knows a little about [facility] and [facility] Boston young and health, with intact cognition, not sure if Deveon Kisiel would actually agree to go to a [facility] if Laquana Villari knows more. For example, Adonys Wildes doesn't know [place] would take almost all of his disability paycheck.  While I do think Jad Johansson is depressed, I also suspect that his poor living situation with his extended family  contribute largely to his SI and seeking for [facility] placement.    # MDD vs. Bipolar -- Continue Depakote ER 1000mg  qhs.  -- Continue Lamictal 100mg . However combining with Depakote, it will decrease the efficacy of Depakote and  increase the risks of Lamictal.  Thus, I would recommend to stop Lamictal and recheck VAP levels. Will defer to primary team to make the final decision.  -- Continue Prolixine 5mg  qam and 15mg  qhs. At this time, it is unclear about the target Sx.  Need to continue to monitor and get collateral information if pt continues to refuse to cooperate.  -- continue Luvox 100mg  Schmitt.   # Anxiety -- continue gabapentin 900mg  TID.  This dose is very high, but pt refuse to reduce.  Most recent research dose indicate the potential of abuse of this medicine with his property of CNS suppressant, especially when combined with opioids.   # Hx of alcohol and BZD abuse.  -- pt denied current use.  Will monitor.   # DM, HTN, and HLD -- continue Metformin 500mg  BID -- continue Lipitor 80mg  Schmitt.  -- continue Coreg 6.25mg  Schmitt.   # Dispo -- defer to primary Team regarding pt's request for Group Home placement.    Observation Level/Precautions:  15 minute checks  Laboratory:  CBC Chemistry Profile Folic Acid  Psychotherapy:    Medications:    Consultations:    Discharge Concerns:    Estimated LOS:  Other:     Physician Treatment Plan for Primary Diagnosis: <principal problem not specified> Long Term Goal(s): Improvement in symptoms so as ready for discharge  Short Term Goals: Ability to identify changes in lifestyle to reduce recurrence of condition will improve, Ability to verbalize feelings will improve, Ability to disclose and discuss suicidal ideas, Ability to demonstrate self-control will improve, Ability to identify and develop effective coping behaviors will improve, Ability to maintain clinical measurements within normal limits will improve, Compliance with prescribed medications will improve and Ability to identify triggers associated with substance abuse/mental health issues will improve  Physician Treatment Plan for Secondary Diagnosis: Active Problems:   Severe recurrent major depression  without psychotic features ([place])  Long Term Goal(s): Improvement in symptoms so as ready for discharge  Short Term Goals: Ability to identify changes in lifestyle to reduce recurrence of condition will improve  I certify that inpatient services furnished can reasonably be expected to improve the patient's condition.    Renji Berwick, MD 2/22/202012:33 PM

## 2018-05-18 NOTE — Progress Notes (Signed)
Patient blood pressure medication held. MD notified.

## 2018-05-18 NOTE — [facility] Suicide Risk Assessment (Signed)
[facility] Admission Suicide Risk Assessment   Nursing information obtained from:  Patient Demographic factors:  Male, Caucasian, Unemployed, Low socioeconomic status Current Mental Status:  Suicidal ideation indicated by patient Loss Factors:  Financial problems / change in socioeconomic status Historical Factors:  Impulsivity Risk Reduction Factors:  Responsible for children under 46 years of age  Total Time spent with patient: 1 hour Principal Problem: Severe recurrent major depression without psychotic features ([place]) Diagnosis:  Principal Problem:   Severe recurrent major depression without psychotic features ([place])  Subjective Data: see HPK  Continued Clinical Symptoms:  Alcohol Use Disorder Identification Test Final Score (AUDIT): 0 The "Alcohol Use Disorders Identification Test", Guidelines for Use in Primary Care, Second Edition.  World Pharmacologist [facility]). Score between 0-7:  no or low risk or alcohol related problems. Score between 8-15:  moderate risk of alcohol related problems. Score between 16-19:  high risk of alcohol related problems. Score 20 or above:  warrants further diagnostic evaluation for alcohol dependence and treatment.   CLINICAL FACTORS:   Depression:   Anhedonia Hopelessness   Musculoskeletal: See HPI  Psychiatric Specialty Exam: See HPI   COGNITIVE FEATURES THAT CONTRIBUTE TO RISK:  Closed-mindedness    SUICIDE RISK:   Mild:  Suicidal ideation of limited frequency, intensity, duration, and specificity.  There are no identifiable plans, no associated intent, mild dysphoria and related symptoms, good self-control (both objective and subjective assessment), few other risk factors, and identifiable protective factors, including available and accessible social support.  PLAN OF CARE: see HPI  I certify that inpatient services furnished can reasonably be expected to improve the patient's condition.   Micha Erck, MD 05/18/2018, 1:15 PM

## 2018-05-19 NOTE — Progress Notes (Signed)
[facility] MD Progress Note  05/19/2018 12:39 PM Michael Schmitt  MRN:  [id number] Subjective:  Pt seen and chart reviewed.  Michael Schmitt continues to report feeling depressed, but not suicidal.  Michael Schmitt said that Michael Schmitt had an argument with his sister right before this admission, which led to his decision of trying a Group home. However, after Michael Schmitt found out that Michael Schmitt might take away most of his Disability check, Michael Schmitt said that Michael Schmitt is not sure if Michael Schmitt wants to go anymore.  It was pointed out to him that Michael Schmitt has been SI frequently recently while living with his family. If Michael Schmitt doesn't want to go to a [facility], Michael Schmitt should try to engage in outpatient treatment, which Michael Schmitt has not done, to prevent his depression gets worse.  Michael Schmitt agreed.   Michael Schmitt is on a polypharmacy regimen, however, Michael Schmitt seems to tolerate so far. Will defer to his primary team and outpatient provider for consolidation.   Principal Problem: Severe recurrent major depression without psychotic features ([place]) Diagnosis: Principal Problem:   Severe recurrent major depression without psychotic features ([place])  Total Time spent with patient: 20 minutes  Past Psychiatric History: Patient has a long history of mood instability. Multiple hospitalizations multiple present [place] to the emergency room. Chronically suicidal. Does have a history of suicide attempts in the past. Chronically noncompliant with recommended treatment plan.  Past Medical History:  Past Medical History:  Diagnosis Date  . Anxiety   . Anxiety   . Arthritis    cerv. stenosis, spondylosis, HNP- lower back , has been followed in pain clinic, has  had injection s in cerv. area  . Blood dyscrasia    told that when Michael Schmitt was young Michael Schmitt was a" free bleeder"  . CAD (coronary artery disease)   . Cervical spondylosis without myelopathy 07/24/2014  . Cervicogenic headache 07/24/2014  . Chronic kidney disease    renal calculi- passed spontaneously  . Depression   . Diabetes mellitus without complication ([place])   . Fatty  liver   . GERD (gastroesophageal reflux disease)   . Headache(784.0)   . Hyperlipidemia   . Hypertension   . Mental disorder   . MI, old   . RLS (restless legs syndrome)    detected on sleep study  . Shortness of breath   . Ventricular fibrillation ([place]) 04/05/2016    Past Surgical History:  Procedure Laterality Date  . ANTERIOR CERVICAL DECOMP/DISCECTOMY FUSION  11/13/2011   Procedure: ANTERIOR CERVICAL DECOMPRESSION/DISCECTOMY FUSION 1 LEVEL;  Surgeon: [name], MD;  Location: [facility];  Service: Neurosurgery;  Laterality: N/A;  Anterior Cervical Decompression/discectomy Fusion. Cervical three-four.  Marland Kitchen CARDIAC CATHETERIZATION N/A 01/01/2015   Procedure: Left Heart Cath and Coronary Angiography;  Surgeon: [name], MD;  Location: [facility];  Service: Cardiovascular;  Laterality: N/A;  . CARDIAC CATHETERIZATION N/A 04/05/2016   Procedure: Left Heart Cath and Coronary Angiography;  Surgeon: Belva Crome, MD;  Location: [facility];  Service: Cardiovascular;  Laterality: N/A;  . CARDIAC CATHETERIZATION N/A 04/05/2016   Procedure: Coronary Stent Intervention;  Surgeon: Belva Crome, MD;  Location: [facility];  Service: Cardiovascular;  Laterality: N/A;  . CARDIAC CATHETERIZATION N/A 04/05/2016   Procedure: Intravascular Ultrasound/IVUS;  Surgeon: Belva Crome, MD;  Location: [facility];  Service: Cardiovascular;  Laterality: N/A;  . NASAL SINUS SURGERY     2005   Family History:  Family History  Problem Relation Age of Onset  .  Prostate cancer Father   . Hypertension Mother   . Kidney Stones Mother   . Anxiety disorder Mother   . Depression Mother   . COPD Sister   . Hypertension Sister   . Diabetes Sister   . Depression Sister   . Anxiety disorder Sister   . Seizures Sister   . ADD / ADHD Son   . ADD / ADHD Daughter    Family Psychiatric  History:  Social History:  Social History   Substance and Sexual Activity  Alcohol Use No   . Alcohol/week: 0.0 standard drinks   Comment: last drinked in 3 months.      Social History   Substance and Sexual Activity  Drug Use No    Social History   Socioeconomic History  . Marital status: Single    Spouse name: Not on file  . Number of children: 3  . Years of education: 43  . Highest education level: Not on file  Occupational History    Comment: unemployed  Social Needs  . Financial resource strain: Not on file  . Food insecurity:    Worry: Not on file    Inability: Not on file  . Transportation needs:    Medical: Not on file    Non-medical: Not on file  Tobacco Use  . Smoking status: Current Every Day Smoker    Packs/day: 2.00    Years: 17.00    Pack years: 34.00    Types: Cigarettes, Cigars  . Smokeless tobacco: Former Systems developer  . Tobacco comment: occassional snuff   Substance and Sexual Activity  . Alcohol use: No    Alcohol/week: 0.0 standard drinks    Comment: last drinked in 3 months.   . Drug use: No  . Sexual activity: Not Currently  Lifestyle  . Physical activity:    Days per week: Not on file    Minutes per session: Not on file  . Stress: Not on file  Relationships  . Social connections:    Talks on phone: Not on file    Gets together: Not on file    Attends religious service: Not on file    Active member of club or organization: Not on file    Attends meetings of clubs or organizations: Not on file    Relationship status: Not on file  Other Topics Concern  . Not on file  Social History Narrative   Patient is right handed.   Patient drinks 2 sodas daily.   Additional Social History:  Michael Schmitt is on disability, and was living with his mom, sister and 2 kids prior to his hospitalization, and expressed interest of going to a [facility] on admission. But changed his mind after Michael Schmitt found that Michael Schmitt would have to use almost all of his Disability check to pay for a [place].   Sleep: Good  Appetite:  Good  Current Medications: Current Facility-Administered  Medications  Medication Dose Route Frequency Provider Last Rate Last Dose  . acetaminophen (TYLENOL) tablet 650 mg  650 mg Oral Q6H PRN Michael Schmitt, Michael Reno, MD   650 mg at 05/19/18 0631  . alum & mag hydroxide-simeth (MAALOX/MYLANTA) 200-200-20 MG/5ML suspension 30 mL  30 mL Oral Q4H PRN Michael Schmitt, John T, MD      . aspirin EC tablet 81 mg  81 mg Oral Daily Michael Schmitt, Michael Reno, MD   81 mg at 05/19/18 7673  . atorvastatin (LIPITOR) tablet 80 mg  80 mg Oral q1800 Michael Schmitt, Michael Reno, MD  80 mg at 05/18/18 1722  . carvedilol (COREG) tablet 3.125 mg  3.125 mg Oral BID WC Kaylamarie Swickard, MD   3.125 mg at 05/19/18 2694  . diphenhydrAMINE (BENADRYL) capsule 50 mg  50 mg Oral QHS Michael Schmitt, Michael Reno, MD   50 mg at 05/18/18 2110  . divalproex (DEPAKOTE ER) 24 hr tablet 1,000 mg  1,000 mg Oral QHS Michael Schmitt, John T, MD   1,000 mg at 05/18/18 2110  . fluPHENAZine (PROLIXIN) tablet 15 mg  15 mg Oral QHS Michael Schmitt, Michael Reno, MD   15 mg at 05/18/18 2112  . fluPHENAZine (PROLIXIN) tablet 5 mg  5 mg Oral Daily Michael Schmitt, Michael Reno, MD   5 mg at 05/19/18 8546  . fluvoxaMINE (LUVOX) tablet 100 mg  100 mg Oral QHS Michael Schmitt, John T, MD   100 mg at 05/18/18 2110  . gabapentin (NEURONTIN) capsule 900 mg  900 mg Oral TID Michael Schmitt, John T, MD   900 mg at 05/19/18 1142  . hydrOXYzine (ATARAX/VISTARIL) tablet 50 mg  50 mg Oral TID PRN Michael Schmitt, Michael Reno, MD   50 mg at 05/19/18 2703  . lamoTRIgine (LAMICTAL) tablet 100 mg  100 mg Oral Daily Michael Schmitt, Michael Reno, MD   100 mg at 05/19/18 0824  . magnesium hydroxide (MILK OF MAGNESIA) suspension 30 mL  30 mL Oral Daily PRN Michael Schmitt, Michael Reno, MD   30 mL at 05/19/18 0823  . metFORMIN (GLUCOPHAGE) tablet 500 mg  500 mg Oral BID WC Michael Schmitt, Michael Reno, MD   500 mg at 05/19/18 0824  . rOPINIRole (REQUIP) tablet 0.25 mg  0.25 mg Oral QHS Michael Schmitt, Michael Reno, MD   0.25 mg at 05/18/18 2116    Lab Results:  Results for orders placed or performed during the hospital encounter of 05/17/18 (from the past 48 hour(s))  Glucose, capillary      Status: Abnormal   Collection Time: 05/17/18  5:35 PM  Result Value Ref Range   Glucose-Capillary 159 (H) 70 - 99 mg/dL  Hemoglobin A1c     Status: Abnormal   Collection Time: 05/18/18  6:55 AM  Result Value Ref Range   Hgb A1c MFr Bld 7.4 (H) 4.8 - 5.6 %    Comment: (NOTE) Pre diabetes:          5.7%-6.4% Diabetes:              >6.4% Glycemic control for   <7.0% adults with diabetes    Mean Plasma Glucose 165.68 mg/dL    Comment: Performed at [facility], [place] [address] [place], [place] [address]  Lipid panel     Status: Abnormal   Collection Time: 05/18/18  6:55 AM  Result Value Ref Range   Cholesterol 131 0 - 200 mg/dL   Triglycerides 415 (H) <150 mg/dL   HDL 22 (L) >40 mg/dL   Total CHOL/HDL Ratio 6.0 RATIO   VLDL UNABLE TO CALCULATE IF TRIGLYCERIDE OVER 400 mg/dL 0 - 40 mg/dL   LDL Cholesterol UNABLE TO CALCULATE IF TRIGLYCERIDE OVER 400 mg/dL 0 - 99 mg/dL    Comment:        Total Cholesterol/HDL:CHD Risk Coronary Heart Disease Risk Table                     Men   Women  1/2 Average Risk   3.4   3.3  Average Risk       5.0   4.4  2 X Average Risk  9.6   7.1  3 X Average Risk  23.4   11.0        Use the calculated Patient Ratio above and the CHD Risk Table to determine the patient's CHD Risk.        ATP III CLASSIFICATION (LDL):  <100     mg/dL   Optimal  100-129  mg/dL   Near or Above                    Optimal  130-159  mg/dL   Borderline  160-189  mg/dL   High  >190     mg/dL   Very High Performed at [facility], [address] [place], [place] [address]   TSH     Status: None   Collection Time: 05/18/18  6:55 AM  Result Value Ref Range   TSH 0.641 0.350 - 4.500 uIU/mL    Comment: Performed by a 3rd Generation assay with a functional sensitivity of <=0.01 uIU/mL. Performed at [facility], [address] [place], [place] [address]   Glucose, capillary     Status: Abnormal   Collection Time: 05/18/18  7:06 AM  Result Value  Ref Range   Glucose-Capillary 170 (H) 70 - 99 mg/dL    Blood Alcohol level:  Lab Results  Component Value Date   ETH <10 05/17/2018   ETH <10 33/54/5625    Metabolic Disorder Labs: Lab Results  Component Value Date   HGBA1C 7.4 (H) 05/18/2018   MPG 165.68 05/18/2018   MPG 151.33 03/31/2018   Lab Results  Component Value Date   PROLACTIN 18.1 (H) 09/12/2016   PROLACTIN 6.5 05/27/2016   Lab Results  Component Value Date   CHOL 131 05/18/2018   TRIG 415 (H) 05/18/2018   HDL 22 (L) 05/18/2018   CHOLHDL 6.0 05/18/2018   VLDL UNABLE TO CALCULATE IF TRIGLYCERIDE OVER 400 mg/dL 05/18/2018   LDLCALC UNABLE TO CALCULATE IF TRIGLYCERIDE OVER 400 mg/dL 05/18/2018   LDLCALC UNABLE TO CALCULATE IF TRIGLYCERIDE OVER 400 mg/dL 03/31/2018    Physical Findings: AIMS:  , ,  ,  ,    CIWA:    COWS:     Musculoskeletal: Strength & Muscle Tone: within normal limits Gait & Station: normal Patient leans: N/A  Psychiatric Specialty Exam: Physical Exam  ROS  Blood pressure 99/71, pulse 63, temperature 97.7 F (36.5 C), temperature source Oral, resp. rate 18, height 6\' 2"  (1.88 m), weight 88 kg, SpO2 99 %.Body mass index is 24.91 kg/m.  General Appearance: Fairly Groomed  Eye Contact:  Fair  Speech:  Clear and Coherent  Volume:  Normal  Mood:  Depressed  Affect:  Appropriate, Congruent, Constricted and Depressed  Thought Process:  Coherent and Goal Directed  Orientation:  Full (Time, Place, and Person)  Thought Content:  Logical  Suicidal Thoughts:  No  Homicidal Thoughts:  No  Memory:  Immediate;   Fair Recent;   Fair  Judgement:  Fair  Insight:  Fair  Psychomotor Activity:  Normal  Concentration:  Concentration: Fair and Attention Span: Fair  Recall:  [employer] of Knowledge:  Fair  Language:  Fair  Akathisia:  No  Handed:   AIMS (if indicated):     Assets:  Communication Skills Physical Health  ADL's:  Intact  Cognition:  WNL  Sleep:        Treatment Plan  Summary: Daily contact with patient to assess and evaluate symptoms and progress in treatment and  Medication management   48yo WM with likely MDD, remote hx of alcohol and BZD abuse, presented in ED frequently for worsening depression and SI, with vague overdose gestures.  Mashayla Lavin likely has housing difficulty with his family, and currently is interested in [facility] placement. However, Suren Payne knows a little about [facility] and Chestina Komatsu young and health, with intact cognition, not sure if Terelle Dobler would actually agree to go to a [facility] if Jaana Brodt knows more. For example, Gaynell Eggleton doesn't know [facility] would take almost all of his disability paycheck.  While I do think Jayanna Kroeger is depressed, I also suspect that his poor living situation with his extended family contribute largely to his SI and seeking for [facility] placement.    # MDD vs. Bipolar -- Continue Depakote ER 1000mg  qhs.  VPA is 102 on 05/16/2018 -- Continue Lamictal 100mg . However combining with Depakote, it will decrease the efficacy of Depakote and increase the risks of Lamictal.  Thus, I would recommend to stop Lamictal and recheck VAP levels. Will defer to primary team to make the final decision.  -- Continue Prolixine 5mg  qam and 15mg  qhs. At this time, it is unclear about the target Sx.  Need to continue to monitor and get collateral information if pt continues to refuse to cooperate.  -- continue Luvox 100mg  daily.   # Anxiety -- continue gabapentin 900mg  TID.  This dose is very high, but pt refuse to reduce.  Most recent research dose indicate the potential of abuse of this medicine with his property of CNS suppressant, especially when combined with opioids.   # Hx of alcohol and BZD abuse.  -- pt denied current use.  no active withdrawal Sx. No indication for CIWA or BZD  # DM, HTN, and HLD -- continue Metformin 500mg  BID -- continue Lipitor 80mg  daily.  -- continue Coreg 6.25mg  daily.   # Dispo -- defer to primary Team regarding pt's request for Group Home placement.   Sondi Desch,  MD 05/19/2018, 12:39 PM

## 2018-05-19 NOTE — [facility] Group Notes (Signed)
LCSW Group Therapy Note 05/19/2018 1:15pm  Type of Therapy and Topic: Group Therapy: Feelings Around Returning Home & Establishing a Supportive Framework and Supporting Oneself When Supports Not Available  Participation Level: Did Not Attend  Description of Group:  Patients first processed thoughts and feelings about upcoming discharge. These included fears of upcoming changes, lack of change, new living environments, judgements and expectations from others and overall stigma of mental health issues. The group then discussed the definition of a supportive framework, what that looks and feels like, and how do to discern it from an unhealthy non-supportive network. The group identified different types of supports as well as what to do when your family/friends are less than helpful or unavailable  Therapeutic Goals  1. Patient will identify one healthy supportive network that they can use at discharge. 2. Patient will identify one factor of a supportive framework and how to tell it from an unhealthy network. 3. Patient able to identify one coping skill to use when they do not have positive supports from others. 4. Patient will demonstrate ability to communicate their needs through discussion and/or role plays.  Summary of Patient Progress:    Therapeutic Modalities Cognitive Behavioral Therapy Motivational Interviewing   [employer], LCSW 05/19/2018 12:20 PM

## 2018-05-19 NOTE — Plan of Care (Signed)
Pt. Complaint with medications and unit procedures.    Problem: Health Behavior/Discharge Planning: Goal: Compliance with treatment plan for underlying cause of condition will improve Outcome: Progressing

## 2018-05-19 NOTE — Plan of Care (Signed)
Patient stated to this writer that he had a better day today than yesterday. Patient says he is glad he is starting to feel better.   Problem: Education: Goal: Emotional status will improve Outcome: Progressing Goal: Mental status will improve Outcome: Progressing

## 2018-05-19 NOTE — Progress Notes (Signed)
D- Patient alert and oriented. Patient presents in a sad/depressed mood on assessment stating that he only slept "half-way" last night and had no major complaints to voice to this writer other than his chronic neck pain, that he rated a "10/10". Patient has already received Tylenol prior to this writer's shift, so he was informed that he had to wait a few more hours to receive something else for pain. Patient rated his depression and anxiety a "10/10", however, he does not know what is making him feel this way. Patient denies SI, HI, AVH, at this time. Patient had no stated goals for today.  A- Scheduled medications administered to patient, per MD orders. Support and encouragement provided.  Routine safety checks conducted every 15 minutes.  Patient informed to notify staff with problems or concerns.  R- No adverse drug reactions noted. Patient contracts for safety at this time. Patient compliant with medications and treatment plan. Patient receptive, calm, and cooperative. Patient interacts well with others on the unit.  Patient remains safe at this time.

## 2018-05-19 NOTE — Progress Notes (Signed)
This Probation officer has received this patient assignment at this time. Will continue care starting at this time.

## 2018-05-19 NOTE — Progress Notes (Signed)
D - Patient was in his room upon arrival to the unit. Patient was pleasant during assessment and medication administration. Patient denies SI/HI/AVH, pain and anxiety. Patient rated his depression 7/10 because he is in here.   A - Patient was compliant with medication administration and procedures on the unit. Patient given education. Patient given support and encouragement to be active in his treatment plan. Patient informed to let staff know if there are any issues or problems on the unit.   R - Patient being monitored Q 15 minutes for safety. Patient remains safe on the unit.

## 2018-05-20 NOTE — Discharge Summary (Signed)
Physician Discharge Summary Note  Patient:  Michael Schmitt is an 48 y.o., male MRN:  [id number] DOB:  January 26, 1971 Patient phone:  [phone] (home)  Patient address:   [address] [place] [address],  Total Time spent with patient: 45 minutes  Date of Admission:  05/17/2018 Date of Discharge: May 20, 2018  Reason for Admission: Patient was admitted through the emergency room where he presented with symptoms of depression and anxiety and suicidal ideation  Principal Problem: Severe recurrent major depression without psychotic features [facility]) Discharge Diagnoses: Principal Problem:   Severe recurrent major depression without psychotic features [facility])   Past Psychiatric History: Patient has a long-standing history of mood instability.  Frequent threats of suicide.  Does have a past history of suicide attempts.  Long history of frustrating noncompliance with recommended treatment interspersed with desperate demands for unrealistic services.  Past Medical History:  Past Medical History:  Diagnosis Date  . Anxiety   . Anxiety   . Arthritis    cerv. stenosis, spondylosis, HNP- lower back , has been followed in pain clinic, has  had injection s in cerv. area  . Blood dyscrasia    told that when he was young he was a" free bleeder"  . CAD (coronary artery disease)   . Cervical spondylosis without myelopathy 07/24/2014  . Cervicogenic headache 07/24/2014  . Chronic kidney disease    renal calculi- passed spontaneously  . Depression   . Diabetes mellitus without complication ([place])   . Fatty liver   . GERD (gastroesophageal reflux disease)   . Headache(784.0)   . Hyperlipidemia   . Hypertension   . Mental disorder   . MI, old   . RLS (restless legs syndrome)    detected on sleep study  . Shortness of breath   . Ventricular fibrillation ([place]) 04/05/2016    Past Surgical History:  Procedure Laterality Date  . ANTERIOR CERVICAL DECOMP/DISCECTOMY FUSION  11/13/2011    Procedure: ANTERIOR CERVICAL DECOMPRESSION/DISCECTOMY FUSION 1 LEVEL;  Surgeon: [name], MD;  Location: [place] [facility]URO ORS;  Service: Neurosurgery;  Laterality: N/A;  Anterior Cervical Decompression/discectomy Fusion. Cervical three-four.  Marland Kitchen CARDIAC CATHETERIZATION N/A 01/01/2015   Procedure: Left Heart Cath and Coronary Angiography;  Surgeon: [name], MD;  Location: [place] CV LAB;  Service: Cardiovascular;  Laterality: N/A;  . CARDIAC CATHETERIZATION N/A 04/05/2016   Procedure: Left Heart Cath and Coronary Angiography;  Surgeon: Belva Crome, MD;  Location: [place] CV LAB;  Service: Cardiovascular;  Laterality: N/A;  . CARDIAC CATHETERIZATION N/A 04/05/2016   Procedure: Coronary Stent Intervention;  Surgeon: Belva Crome, MD;  Location: [place] CV LAB;  Service: Cardiovascular;  Laterality: N/A;  . CARDIAC CATHETERIZATION N/A 04/05/2016   Procedure: Intravascular Ultrasound/IVUS;  Surgeon: Belva Crome, MD;  Location: [place] CV LAB;  Service: Cardiovascular;  Laterality: N/A;  . NASAL SINUS SURGERY     2005   Family History:  Family History  Problem Relation Age of Onset  . Prostate cancer Father   . Hypertension Mother   . Kidney Stones Mother   . Anxiety disorder Mother   . Depression Mother   . COPD Sister   . Hypertension Sister   . Diabetes Sister   . Depression Sister   . Anxiety disorder Sister   . Seizures Sister   . ADD / ADHD Son   . ADD / ADHD Daughter    Family Psychiatric  History: Anxiety in multiple family members Social History:  Social History   Substance and Sexual Activity  Alcohol Use No  . Alcohol/week: 0.0 standard drinks   Comment: last drinked in 3 months.      Social History   Substance and Sexual Activity  Drug Use No    Social History   Socioeconomic History  . Marital status: Single    Spouse name: Not on file  . Number of children: 3  . Years of education: 71  . Highest education level: Not on file  Occupational  History    Comment: unemployed  Social Needs  . Financial resource strain: Not on file  . Food insecurity:    Worry: Not on file    Inability: Not on file  . Transportation needs:    Medical: Not on file    Non-medical: Not on file  Tobacco Use  . Smoking status: Current Every Day Smoker    Packs/day: 2.00    Years: 17.00    Pack years: 34.00    Types: Cigarettes, Cigars  . Smokeless tobacco: Former Systems developer  . Tobacco comment: occassional snuff   Substance and Sexual Activity  . Alcohol use: No    Alcohol/week: 0.0 standard drinks    Comment: last drinked in 3 months.   . Drug use: No  . Sexual activity: Not Currently  Lifestyle  . Physical activity:    Days per week: Not on file    Minutes per session: Not on file  . Stress: Not on file  Relationships  . Social connections:    Talks on phone: Not on file    Gets together: Not on file    Attends religious service: Not on file    Active member of club or organization: Not on file    Attends meetings of clubs or organizations: Not on file    Relationship status: Not on file  Other Topics Concern  . Not on file  Social History Narrative   Patient is right handed.   Patient drinks 2 sodas daily.    Hospital Course: Patient was admitted to the hospital.  15-minute checks in place.  Medications continued in accordance with previous prescriptions.  Patient did not engage in any dangerous or suicidal behavior in the hospital.  Has denied suicidal intent or plan.  Does not appear to be psychotic.  Patient had gotten admitted to the hospital with the understanding that he was agreeing to go to a group home which could interrupt the cycle of his visits to the emergency room.  Once in the hospital he has refused this option saying that his mother needs his money from his check at home instead.  Patient is now denied any suicidal ideation.  Appears to be calm and at baseline.  No new physical problems.  He will be discharged and continue  his scheduled already in place to follow-up wi with [facility].  No new prescriptions written  Physical Findings: AIMS:  , ,  ,  ,    CIWA:    COWS:     Musculoskeletal: Strength & Muscle Tone: within normal limits Gait & Station: normal Patient leans: N/A  Psychiatric Specialty Exam: Physical Exam  Nursing note and vitals reviewed. Constitutional: He appears well-developed and well-nourished.  HENT:  Head: Normocephalic and atraumatic.  Eyes: Pupils are equal, round, and reactive to light. Conjunctivae are normal.  Neck: Normal range of motion.  Cardiovascular: Regular rhythm and normal heart sounds.  Respiratory: Effort normal. No respiratory distress.  GI: Soft.  Musculoskeletal: Normal  range of motion.  Neurological: He is alert.  Skin: Skin is warm and dry.  Psychiatric: Judgment normal. His affect is blunt. His speech is delayed. He is slowed. Thought content is not paranoid. Cognition and memory are normal. He expresses no homicidal and no suicidal ideation.    Review of Systems  Constitutional: Negative.   HENT: Negative.   Eyes: Negative.   Respiratory: Negative.   Cardiovascular: Negative.   Gastrointestinal: Negative.   Musculoskeletal: Negative.   Skin: Negative.   Neurological: Negative.   Psychiatric/Behavioral: Positive for depression. Negative for hallucinations, memory loss, substance abuse and suicidal ideas. The patient is nervous/anxious and has insomnia.     Blood pressure 122/68, pulse 69, temperature 97.7 F (36.5 C), temperature source Oral, resp. rate 15, height 6\' 2"  (1.88 m), weight 88 kg, SpO2 99 %.Body mass index is 24.91 kg/m.  General Appearance: Casual  Eye Contact:  Good  Speech:  Slow  Volume:  Decreased  Mood:  Anxious  Affect:  Constricted  Thought Process:  Goal Directed  Orientation:  Full (Time, Place, and Person)  Thought Content:  Logical  Suicidal Thoughts:  No  Homicidal Thoughts:  No  Memory:  Immediate;   Fair Recent;    Fair Remote;   Fair  Judgement:  Fair  Insight:  Fair  Psychomotor Activity:  Decreased  Concentration:  Concentration: Fair  Recall:  [place] of Knowledge:  Fair  Language:  Fair  Akathisia:  No  Handed:  Right  AIMS (if indicated):     Assets:  Desire for Improvement Housing Physical Health  ADL's:  Intact  Cognition:  WNL  Sleep:        Have you used any form of tobacco in the last 30 days? (Cigarettes, Smokeless Tobacco, Cigars, and/or Pipes): Yes  Has this patient used any form of tobacco in the last 30 days? (Cigarettes, Smokeless Tobacco, Cigars, and/or Pipes) Yes, Yes, A prescription for an FDA-approved tobacco cessation medication was offered at discharge and the patient refused  Blood Alcohol level:  Lab Results  Component Value Date   [facility] <10 05/17/2018   ETH <10 10/93/2355    Metabolic Disorder Labs:  Lab Results  Component Value Date   HGBA1C 7.4 (H) 05/18/2018   MPG 165.68 05/18/2018   MPG 151.33 03/31/2018   Lab Results  Component Value Date   PROLACTIN 18.1 (H) 09/12/2016   PROLACTIN 6.5 05/27/2016   Lab Results  Component Value Date   CHOL 131 05/18/2018   TRIG 415 (H) 05/18/2018   HDL 22 (L) 05/18/2018   CHOLHDL 6.0 05/18/2018   VLDL UNABLE TO CALCULATE IF TRIGLYCERIDE OVER 400 mg/dL 05/18/2018   LDLCALC UNABLE TO CALCULATE IF TRIGLYCERIDE OVER 400 mg/dL 05/18/2018   LDLCALC UNABLE TO CALCULATE IF TRIGLYCERIDE OVER 400 mg/dL 03/31/2018    See Psychiatric Specialty Exam and Suicide Risk Assessment completed by Attending Physician prior to discharge.  Discharge destination:  Home  Is patient on multiple antipsychotic therapies at discharge:  No   Has Patient had three or more failed trials of antipsychotic monotherapy by history:  No  Recommended Plan for Multiple Antipsychotic Therapies: NA  Discharge Instructions    Diet - low sodium heart healthy   Complete by:  As directed    Increase activity slowly   Complete by:  As  directed      Allergies as of 05/20/2018      Reactions   Prednisone Other (See Comments)   Hypertension and  makes him feel "spacey"   Hydrocodone Other (See Comments)   Causes headaches and makes the patient irritable   Trazodone And Nefazodone Other (See Comments)   Patient stated that this keeps him awake; "has an opposite effect on me"   Varenicline Other (See Comments)   Suicidal thoughts      Medication List    TAKE these medications     Indication  aspirin 81 MG EC tablet Take 1 tablet (81 mg total) by mouth daily. For heart health  Indication:  Heart health   atorvastatin 80 MG tablet Commonly known as:  LIPITOR Take 1 tablet (80 mg total) by mouth daily at 6 PM. For high cholesterol What changed:  when to take this  Indication:  Inherited Heterozygous Hypercholesterolemia, High Amount of Fats in the Blood   carvedilol 6.25 MG tablet Commonly known as:  COREG Take 1 tablet (6.25 mg total) by mouth 2 (two) times daily with a meal. For high blood pressure  Indication:  High Blood Pressure of Unknown Cause   cloNIDine 0.1 MG tablet Commonly known as:  CATAPRES Take 1 tablet (0.1 mg total) by mouth 3 (three) times daily for 30 days.  Indication:  agitation   diphenhydrAMINE 25 MG tablet Commonly known as:  BENADRYL ALLERGY Take 2 tablets (50 mg total) by mouth at bedtime for 30 days.  Indication:  Extrapyramidal Reaction   divalproex 500 MG 24 hr tablet Commonly known as:  DEPAKOTE ER Take 2 tablets (1,000 mg total) by mouth at bedtime.  Indication:  Depressive Phase of Manic-Depression   fluPHENAZine 5 MG tablet Commonly known as:  PROLIXIN Take 3 tablets (15 mg total) by mouth at bedtime.  Indication:  Psychosis   fluPHENAZine 5 MG tablet Commonly known as:  PROLIXIN Take 1 tablet (5 mg total) by mouth daily. In the morning  Indication:  schizoaffective and movement disorder   fluvoxaMINE 100 MG tablet Commonly known as:  LUVOX Take 1 tablet (100 mg  total) by mouth at bedtime.  Indication:  Major Depressive Disorder, Obsessive Compulsive Disorder, Panic Disorder   gabapentin 300 MG capsule Commonly known as:  NEURONTIN Take 3 capsules (900 mg total) by mouth 3 (three) times daily.  Indication:  Social Anxiety Disorder   ibuprofen 800 MG tablet Commonly known as:  ADVIL,MOTRIN Take 2,400 mg by mouth 3 (three) times daily as needed.  Indication:  Inflammation   lamoTRIgine 100 MG tablet Commonly known as:  LAMICTAL Take 1 tablet (100 mg total) by mouth daily.  Indication:  Depressive Phase of Manic-Depression   metFORMIN 500 MG tablet Commonly known as:  GLUCOPHAGE Take 1 tablet (500 mg total) by mouth 2 (two) times daily with a meal. For diabetes management  Indication:  Type 2 Diabetes   nitroGLYCERIN 0.4 MG SL tablet Commonly known as:  NITROSTAT Place 1 tablet (0.4 mg total) under the tongue every 5 (five) minutes x 3 doses as needed. For chest pain  Indication:  Acute Angina Pectoris   rOPINIRole 0.25 MG tablet Commonly known as:  REQUIP Take 1 tablet (0.25 mg total) by mouth at bedtime for 30 days.  Indication:  Restless Leg Syndrome      Follow-up Information    [facility] Follow up.   Contact information: [address] [place] [address] [phone]           Follow-up recommendations:  Activity:  As tolerated Diet:  Diabetic carb modified diet Other:  Follow-up with [facility].  Continue  medication and therapy plan  Comments: Patient is at chronic risk for return of suicidal ideation but does not benefit from inpatient hospitalization.  No indication that I can see at this point to change medicine at the time of discharge.  Signed: Alethia Berthold, MD 05/20/2018, 12:03 PM

## 2018-05-20 NOTE — Progress Notes (Signed)
Patient ID: Michael Schmitt, male   DOB: [birth year], 48 y.o.   MRN: [id number]   Discharge Note:  Patient denies SI/HI/AVH at this time. Discharge instructions, AVS, prescriptions and transition record gone over with patient. Patient agrees to comply with medication management, follow-up visit, and outpatient therapy.  Patient belongings returned to patient. Patient questions and concerns addressed and answered. Patient ambulatory off unit. Patient discharged to home with sister.

## 2018-05-20 NOTE — BHH Suicide Risk Assessment (Signed)
[facility] INPATIENT:  Family/Significant Other Suicide Prevention Education  Suicide Prevention Education:  Patient Refusal for Family/Significant Other Suicide Prevention Education: The patient Michael Schmitt has refused to provide written consent for family/significant other to be provided Family/Significant Other Suicide Prevention Education during admission and/or prior to discharge.  Physician notified.  SPE completed with pt, as pt refused to consent to family contact. SPI pamphlet provided to pt and pt was encouraged to share information with support network, ask questions, and talk about any concerns relating to SPE. Pt denies access to guns/firearms and verbalized understanding of information provided. Mobile Crisis information also provided to pt.   Rozann Lesches 05/20/2018, 9:18 AM

## 2018-05-20 NOTE — BHH Suicide Risk Assessment (Signed)
CSW attempted to call the pt's current provider [facility] to schedule a follow up hospital discharge appointment, however, CSW had to leave a voicemail message.   Assunta Curtis, MSW, LCSW 05/20/2018 9:21 AM

## 2018-05-20 NOTE — Progress Notes (Signed)
Recreation Therapy Notes   Date: 05/20/2018  Time: 9:30 am   Location: Craft room   Behavioral response: N/A   Intervention Topic: Goals  Discussion/Intervention: Patient did not attend group.   Clinical Observations/Feedback:  Patient did not attend group.   Paisleigh Maroney LRT/CTRS        Sharniece Gibbon 05/20/2018 11:37 AM

## 2018-05-20 NOTE — Progress Notes (Signed)
  [facility] Adult Case Management Discharge Plan :  Will you be returning to the same living situation after discharge:  Yes,  pt is returning home. At discharge, do you have transportation home?: Yes,  pt sister will provide transportation.  Do you have the ability to pay for your medications: Yes,  [facility] Medicare  Release of information consent forms completed and in the chart;  Patient's signature needed at discharge.  Patient to Follow up at: Follow-up Information    [facility] Follow up.   Why:  Please follow up with your provider.  Contact information: [place] [address] [phone]           Next level of care provider has access to [place] and Suicide Prevention discussed: No. Pt declined family contact at this time.  SPE completed with the patient.    Have you used any form of tobacco in the last 30 days? (Cigarettes, Smokeless Tobacco, Cigars, and/or Pipes): Yes  Has patient been referred to the Quitline?: Patient refused referral  Patient has been referred for addiction treatment: Yes  Rozann Lesches, LCSW 05/20/2018, 1:27 PM

## 2018-05-20 NOTE — BHH Suicide Risk Assessment (Signed)
[facility] Discharge Suicide Risk Assessment   Principal Problem: Severe recurrent major depression without psychotic features [facility]) Discharge Diagnoses: Principal Problem:   Severe recurrent major depression without psychotic features ([place])   Total Time spent with patient: 45 minutes  Musculoskeletal: Strength & Muscle Tone: within normal limits Gait & Station: normal Patient leans: N/A  Psychiatric Specialty Exam: Review of Systems  Constitutional: Negative.   HENT: Negative.   Eyes: Negative.   Respiratory: Negative.   Cardiovascular: Negative.   Gastrointestinal: Negative.   Musculoskeletal: Negative.   Skin: Negative.   Neurological: Negative.   Psychiatric/Behavioral: Positive for depression. Negative for hallucinations, memory loss, substance abuse and suicidal ideas. The patient is nervous/anxious and has insomnia.     Blood pressure 122/68, pulse 69, temperature 97.7 F (36.5 C), temperature source Oral, resp. rate 15, height 6\' 2"  (1.88 m), weight 88 kg, SpO2 99 %.Body mass index is 24.91 kg/m.  General Appearance: Casual  Eye Contact::  Good  Speech:  Normal Rate409  Volume:  Normal  Mood:  Euthymic  Affect:  Congruent  Thought Process:  Goal Directed  Orientation:  Full (Time, Place, and Person)  Thought Content:  Logical  Suicidal Thoughts:  No  Homicidal Thoughts:  No  Memory:  Immediate;   Fair Recent;   Fair Remote;   Fair  Judgement:  Fair  Insight:  Fair  Psychomotor Activity:  Decreased  Concentration:  Fair  Recall:  [employer] of Knowledge:Fair  Language: Fair  Akathisia:  No  Handed:  Right  AIMS (if indicated):     Assets:  Desire for Improvement  Sleep:     Cognition: WNL  ADL's:  Intact   Mental Status Per Nursing Assessment::   On Admission:  Suicidal ideation indicated by patient  Demographic Factors:  Male, Divorced or widowed, Caucasian, Low socioeconomic status and Unemployed  Loss Factors: Loss of significant  relationship  Historical Factors: Prior suicide attempts and Impulsivity  Risk Reduction Factors:   Responsible for children under 66 years of age, Sense of responsibility to family and Positive therapeutic relationship  Continued Clinical Symptoms:  Depression:   Impulsivity  Cognitive Features That Contribute To Risk:  Loss of executive function    Suicide Risk:  Minimal: No identifiable suicidal ideation.  Patients presenting with no risk factors but with morbid ruminations; may be classified as minimal risk based on the severity of the depressive symptoms  Follow-up Information    [facility] Follow up.   Contact information: [place] [address] [phone]           Plan Of Care/Follow-up recommendations:  Activity:  Activity as tolerated Diet:  Regular diet Other:  Follow-up by going to [facility] and staying on your medication and getting involved with therapy  Alethia Berthold, MD 05/20/2018, 11:59 AM

## 2018-05-20 NOTE — Progress Notes (Signed)
D- Patient alert and oriented. Patient presents in a pleasant mood on assessment reporting that he didn't sleep good "I just couldn't sleep". Patient rated his depression and anxiety an "8/10", but he could not verbalize what is making him feel this way. Patient denies SI, HI, AVH, at this time. Patient's goal for today is to go home.  A- Scheduled medications administered to patient, per MD orders. Support and encouragement provided.  Routine safety checks conducted every 15 minutes.  Patient informed to notify staff with problems or concerns.  R- No adverse drug reactions noted. Patient contracts for safety at this time. Patient compliant with medications and treatment plan. Patient receptive, calm, and cooperative. Patient interacts well with others on the unit.  Patient remains safe at this time.

## 2018-05-20 NOTE — BHH Counselor (Signed)
CSW notes that patient was discharged prior to treatment team.    Assunta Curtis, MSW, LCSW 05/20/2018 1:27 PM

## 2018-05-21 ENCOUNTER — Ambulatory Visit: Payer: Self-pay | Admitting: Psychology

## 2018-05-22 ENCOUNTER — Emergency Department
Admission: EM | Admit: 2018-05-22 | Discharge: 2018-05-23 | Disposition: A | Discharge status: Home / Self Care | Payer: Medicare Other | Attending: Emergency Medicine | Admitting: Emergency Medicine

## 2018-05-22 ENCOUNTER — Encounter: Payer: Self-pay | Admitting: Emergency Medicine

## 2018-05-22 ENCOUNTER — Other Ambulatory Visit: Payer: Self-pay

## 2018-05-22 DIAGNOSIS — F17210 Nicotine dependence, cigarettes, uncomplicated: Secondary | ICD-10-CM | POA: Insufficient documentation

## 2018-05-22 DIAGNOSIS — F329 Major depressive disorder, single episode, unspecified: Secondary | ICD-10-CM | POA: Diagnosis not present

## 2018-05-22 DIAGNOSIS — N189 Chronic kidney disease, unspecified: Secondary | ICD-10-CM | POA: Insufficient documentation

## 2018-05-22 DIAGNOSIS — Z79899 Other long term (current) drug therapy: Secondary | ICD-10-CM | POA: Insufficient documentation

## 2018-05-22 DIAGNOSIS — R45851 Suicidal ideations: Secondary | ICD-10-CM | POA: Insufficient documentation

## 2018-05-22 DIAGNOSIS — Z7982 Long term (current) use of aspirin: Secondary | ICD-10-CM | POA: Diagnosis not present

## 2018-05-22 DIAGNOSIS — F332 Major depressive disorder, recurrent severe without psychotic features: Secondary | ICD-10-CM | POA: Diagnosis not present

## 2018-05-22 DIAGNOSIS — I2510 Atherosclerotic heart disease of native coronary artery without angina pectoris: Secondary | ICD-10-CM | POA: Diagnosis not present

## 2018-05-22 DIAGNOSIS — F17290 Nicotine dependence, other tobacco product, uncomplicated: Secondary | ICD-10-CM | POA: Diagnosis not present

## 2018-05-22 DIAGNOSIS — E1122 Type 2 diabetes mellitus with diabetic chronic kidney disease: Secondary | ICD-10-CM | POA: Diagnosis not present

## 2018-05-22 DIAGNOSIS — J45909 Unspecified asthma, uncomplicated: Secondary | ICD-10-CM | POA: Diagnosis not present

## 2018-05-22 DIAGNOSIS — I129 Hypertensive chronic kidney disease with stage 1 through stage 4 chronic kidney disease, or unspecified chronic kidney disease: Secondary | ICD-10-CM | POA: Diagnosis not present

## 2018-05-22 DIAGNOSIS — F32A Depression, unspecified: Secondary | ICD-10-CM

## 2018-05-22 DIAGNOSIS — Z7984 Long term (current) use of oral hypoglycemic drugs: Secondary | ICD-10-CM | POA: Diagnosis not present

## 2018-05-22 DIAGNOSIS — Z046 Encounter for general psychiatric examination, requested by authority: Secondary | ICD-10-CM | POA: Diagnosis present

## 2018-05-22 LAB — CBC
HCT: 45.6 % (ref 39.0–52.0)
Hemoglobin: 15.6 g/dL (ref 13.0–17.0)
MCH: 31.6 pg (ref 26.0–34.0)
MCHC: 34.2 g/dL (ref 30.0–36.0)
MCV: 92.3 fL (ref 80.0–100.0)
Platelets: 223 10*3/uL (ref 150–400)
RBC: 4.94 MIL/uL (ref 4.22–5.81)
RDW: 15.9 % — ABNORMAL HIGH (ref 11.5–15.5)
WBC: 9.6 10*3/uL (ref 4.0–10.5)
nRBC: 0 % (ref 0.0–0.2)

## 2018-05-22 LAB — ACETAMINOPHEN LEVEL

## 2018-05-22 LAB — COMPREHENSIVE METABOLIC PANEL
ALT: 28 U/L (ref 0–44)
AST: 35 U/L (ref 15–41)
Albumin: 4.6 g/dL (ref 3.5–5.0)
Alkaline Phosphatase: 115 U/L (ref 38–126)
Anion gap: 12 (ref 5–15)
BUN: 8 mg/dL (ref 6–20)
CO2: 19 mmol/L — ABNORMAL LOW (ref 22–32)
Calcium: 9.4 mg/dL (ref 8.9–10.3)
Chloride: 102 mmol/L (ref 98–111)
Creatinine, Ser: 1.03 mg/dL (ref 0.61–1.24)
GFR calc Af Amer: >60 mL/min (ref >60)
GFR calc non Af Amer: >60 mL/min (ref >60)
Glucose, Bld: 232 mg/dL — ABNORMAL HIGH (ref 70–99)
Potassium: 3.9 mmol/L (ref 3.5–5.1)
SODIUM: 133 mmol/L — AB (ref 135–145)
Total Bilirubin: 0.6 mg/dL (ref 0.3–1.2)
Total Protein: 7.5 g/dL (ref 6.5–8.1)

## 2018-05-22 LAB — SALICYLATE LEVEL: Salicylate Lvl: <7 mg/dL (ref 2.8–30.0)

## 2018-05-22 LAB — VALPROIC ACID LEVEL: Valproic Acid Lvl: 18 ug/mL — ABNORMAL LOW (ref 50.0–100.0)

## 2018-05-22 LAB — ETHANOL: Alcohol, Ethyl (B): <10 mg/dL (ref <10)

## 2018-05-22 MED ORDER — FLUPHENAZINE HCL 5 MG PO TABS
15.0000 mg | ORAL_TABLET | Freq: Every day | ORAL | Status: DC
  Administered 2018-05-22: 15 mg via ORAL
  Filled 2018-05-22 (×2): qty 3

## 2018-05-22 MED ORDER — LAMOTRIGINE 100 MG PO TABS
100.0000 mg | ORAL_TABLET | Freq: Every day | ORAL | Status: DC
  Administered 2018-05-22 – 2018-05-23 (×2): 100 mg via ORAL
  Filled 2018-05-22 (×2): qty 1

## 2018-05-22 MED ORDER — DIVALPROEX SODIUM 500 MG PO DR TAB
1000.0000 mg | DELAYED_RELEASE_TABLET | Freq: Every day | ORAL | Status: DC
  Administered 2018-05-22: 1000 mg via ORAL
  Filled 2018-05-22: qty 2

## 2018-05-22 MED ORDER — ATORVASTATIN CALCIUM 20 MG PO TABS
80.0000 mg | ORAL_TABLET | Freq: Every day | ORAL | Status: DC
  Administered 2018-05-23: 80 mg via ORAL
  Filled 2018-05-22: qty 4

## 2018-05-22 MED ORDER — DIPHENHYDRAMINE HCL 25 MG PO CAPS
50.0000 mg | ORAL_CAPSULE | Freq: Every day | ORAL | Status: DC
  Administered 2018-05-22: 50 mg via ORAL
  Filled 2018-05-22: qty 2

## 2018-05-22 MED ORDER — DIVALPROEX SODIUM 500 MG PO DR TAB
500.0000 mg | DELAYED_RELEASE_TABLET | ORAL | Status: DC
  Administered 2018-05-22 – 2018-05-23 (×2): 500 mg via ORAL
  Filled 2018-05-22 (×2): qty 1

## 2018-05-22 MED ORDER — FLUVOXAMINE MALEATE 50 MG PO TABS
100.0000 mg | ORAL_TABLET | Freq: Every day | ORAL | Status: DC
  Administered 2018-05-22: 100 mg via ORAL
  Filled 2018-05-22 (×2): qty 2

## 2018-05-22 MED ORDER — CLONAZEPAM 0.5 MG PO TABS
0.5000 mg | ORAL_TABLET | Freq: Two times a day (BID) | ORAL | Status: DC
  Administered 2018-05-22 – 2018-05-23 (×2): 0.5 mg via ORAL
  Filled 2018-05-22 (×2): qty 1

## 2018-05-22 MED ORDER — IBUPROFEN 600 MG PO TABS
600.0000 mg | ORAL_TABLET | Freq: Four times a day (QID) | ORAL | Status: DC | PRN
  Filled 2018-05-22: qty 1

## 2018-05-22 MED ORDER — DIVALPROEX SODIUM ER 250 MG PO TB24: 1000.0000 mg | ORAL_TABLET | Freq: Every day | ORAL | Status: DC

## 2018-05-22 MED ORDER — CLONIDINE HCL 0.1 MG PO TABS
0.1000 mg | ORAL_TABLET | Freq: Three times a day (TID) | ORAL | Status: DC
  Administered 2018-05-22 – 2018-05-23 (×4): 0.1 mg via ORAL
  Filled 2018-05-22 (×4): qty 1

## 2018-05-22 MED ORDER — METFORMIN HCL 500 MG PO TABS
500.0000 mg | ORAL_TABLET | Freq: Two times a day (BID) | ORAL | Status: DC
  Administered 2018-05-23: 500 mg via ORAL
  Filled 2018-05-22: qty 1

## 2018-05-22 MED ORDER — FLUPHENAZINE HCL 5 MG PO TABS
5.0000 mg | ORAL_TABLET | Freq: Every day | ORAL | Status: DC
  Administered 2018-05-23: 5 mg via ORAL
  Filled 2018-05-22 (×3): qty 1

## 2018-05-22 MED ORDER — ROPINIROLE HCL 0.25 MG PO TABS
0.2500 mg | ORAL_TABLET | Freq: Every day | ORAL | Status: DC
  Administered 2018-05-22: 0.25 mg via ORAL
  Filled 2018-05-22 (×2): qty 1

## 2018-05-22 MED ORDER — CARVEDILOL 6.25 MG PO TABS
6.2500 mg | ORAL_TABLET | Freq: Two times a day (BID) | ORAL | Status: DC
  Administered 2018-05-23: 6.25 mg via ORAL
  Filled 2018-05-22: qty 1

## 2018-05-22 MED ORDER — GABAPENTIN 300 MG PO CAPS
900.0000 mg | ORAL_CAPSULE | Freq: Three times a day (TID) | ORAL | Status: DC
  Administered 2018-05-22 – 2018-05-23 (×4): 900 mg via ORAL
  Filled 2018-05-22 (×4): qty 3

## 2018-05-22 MED ORDER — CLONAZEPAM 0.25 MG PO TBDP
0.5000 mg | ORAL_TABLET | Freq: Once | ORAL | Status: AC
  Administered 2018-05-22: 0.5 mg via ORAL

## 2018-05-22 MED ORDER — ASPIRIN EC 81 MG PO TBEC
81.0000 mg | DELAYED_RELEASE_TABLET | Freq: Every day | ORAL | Status: DC
  Administered 2018-05-22 – 2018-05-23 (×2): 81 mg via ORAL
  Filled 2018-05-22 (×2): qty 1

## 2018-05-22 MED ORDER — FLUVOXAMINE MALEATE 50 MG PO TABS
50.0000 mg | ORAL_TABLET | Freq: Every morning | ORAL | Status: DC
  Administered 2018-05-23: 50 mg via ORAL
  Filled 2018-05-22 (×2): qty 1

## 2018-05-22 NOTE — ED Notes (Signed)
Patient brought from triage to room 23 b, Patient yelling " I need help" patient is easily redirected, told nurse that He was supposed to go to group home and that He should have went, and He regrets not going, Nurse will continue to monitor.

## 2018-05-22 NOTE — ED Notes (Signed)
Patient yells out in intervals, his sister did visit for a couple of minutes , Patient remained calm during visit.

## 2018-05-22 NOTE — Consult Note (Addendum)
[facility] Consult   Reason for Consult:  Suicidal Ideation Referring Physician:  Dr. Mariea Clonts Patient Identification: Michael Schmitt MRN:  [id number] Principal Diagnosis: Suicidal Ideation Diagnosis:  Suicidal Ideation  Patient is seen, chart is reviewed.  Patient had recent hospital admission from February 21 through May 20, 2018 for stabilization, however no medication changes made.  Patient has a history of noncompliance with medication.   Total Time spent with patient: 1 hour   Subjective: " I didn't sleep at all last night."  Michael Schmitt is a 48 y.o. 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  Patient presented to ED "for help.  I want to move to a group home."  Patient is screaming in lobby, smacking himself in head. Yelling, "I need some help!"     During last emergency room stay, a family meeting was held and was shared decision making the following medication adjustments were made: Continue Clonidine 0.1 mg three times a day as needed for agitation and ADHD, can be transitioned to 0.1 mg at bedtime with 0.1 mg use twice daily as needed. Continue Prolixin 5 mg in the morning and 15 mg at night for mood stabilization, neck movements and sleep.   Adjustments can be made by outpatient psychiatrist as indicated.  Patient can also be transitioned to Prolixin decanoate for monthly injections with a p.o. dose maintained at bedtime. Continue Luvox 100 mg at bedtime for depression, anxiety and impulsivity.  This can be increased by 50 mg every 1 to 2 weeks up to a total of 300 mg daily in divided dosing with larger dose given at evening dose. Decrease Depakote ER 1000 mg at bedtime for mood stabilization, sleep, impulsivity and nerve pain. Continue Benadryl 50 mg at night for sleep and to decrease side effect of antipsychotic Continue Requip 0.25 mg at bedtime for restless leg  syndrome.  Patient should follow-up with outpatient primary care doctor for continuation of this medication. Of note patient does use nicotine at home, which could account for decreased effectiveness of medication at home.  Labs reviewed, valproic acid level was 18 (low)  On evaluation, patient is calm and cooperative, but has sudden bursts of hitting his head and yelling out.  Patient reports that after the last time he was in the emergency department and his medications were increased, he ran out.  He failed to pick up new prescription at the pharmacy.  Patient reported that his suicidal thoughts returned and that is why he was admitted at from February 21-24.  He reports that he returned home and still had suicidal thoughts, and also argued with his sister.  He reports that things are not going well at home and he thinks that he should move to a group home.  Patient is redirectable from behaviors and is able to sit calmly on the bed.  He is awake alert and oriented x4.  He continues to endorse suicidal thoughts at this time.  He denies HI.  He denies AVH.  Patient has not had outpatient follow-up in many weeks.  Patient reports that he previously took Klonopin 1 to 2 mg 3 times daily, and did not have depressive symptoms or behavioral outbursts.  Risk to Self:  Yes Risk to Others:  Denies Prior Inpatient Therapy:  Multiple hospitalizations Prior Outpatient Therapy:  [facility] for outpatient psychiatry medication management and therapy.  (Patient does have difficulty with co-pays and making appointments)  Past Medical History:  Past Medical History:  Diagnosis Date  . Anxiety   . Anxiety   . Arthritis    cerv. stenosis, spondylosis, HNP- lower back , has been followed in pain clinic, has  had injection s in cerv. area  . Blood dyscrasia    told that when he was young he was a" free bleeder"  . CAD (coronary artery disease)   . Cervical spondylosis without myelopathy 07/24/2014  . Cervicogenic  headache 07/24/2014  . Chronic kidney disease    renal calculi- passed spontaneously  . Depression   . Diabetes mellitus without complication ([place])   . Fatty liver   . GERD (gastroesophageal reflux disease)   . Headache(784.0)   . Hyperlipidemia   . Hypertension   . Mental disorder   . MI, old   . RLS (restless legs syndrome)    detected on sleep study  . Shortness of breath   . Ventricular fibrillation ([place]) 04/05/2016    Past Surgical History:  Procedure Laterality Date  . ANTERIOR CERVICAL DECOMP/DISCECTOMY FUSION  11/13/2011   Procedure: ANTERIOR CERVICAL DECOMPRESSION/DISCECTOMY FUSION 1 LEVEL;  Surgeon: [name], MD;  Location: [facility];  Service: Neurosurgery;  Laterality: N/A;  Anterior Cervical Decompression/discectomy Fusion. Cervical three-four.  Marland Kitchen CARDIAC CATHETERIZATION N/A 01/01/2015   Procedure: Left Heart Cath and Coronary Angiography;  Surgeon: [name], MD;  Location: [facility];  Service: Cardiovascular;  Laterality: N/A;  . CARDIAC CATHETERIZATION N/A 04/05/2016   Procedure: Left Heart Cath and Coronary Angiography;  Surgeon: Belva Crome, MD;  Location: [facility];  Service: Cardiovascular;  Laterality: N/A;  . CARDIAC CATHETERIZATION N/A 04/05/2016   Procedure: Coronary Stent Intervention;  Surgeon: Belva Crome, MD;  Location: [facility];  Service: Cardiovascular;  Laterality: N/A;  . CARDIAC CATHETERIZATION N/A 04/05/2016   Procedure: Intravascular Ultrasound/IVUS;  Surgeon: Belva Crome, MD;  Location: [facility];  Service: Cardiovascular;  Laterality: N/A;  . NASAL SINUS SURGERY     2005   Family History:  Family History  Problem Relation Age of Onset  . Prostate cancer Father   . Hypertension Mother   . Kidney Stones Mother   . Anxiety disorder Mother   . Depression Mother   . COPD Sister   . Hypertension Sister   . Diabetes Sister   . Depression Sister   . Anxiety disorder Sister   . Seizures Sister   .  ADD / ADHD Son   . ADD / ADHD Daughter    Family Psychiatric  History: Denies  Social History:  Social History   Substance and Sexual Activity  Alcohol Use No  . Alcohol/week: 0.0 standard drinks   Comment: last drinked in 3 months.      Social History   Substance and Sexual Activity  Drug Use No    Social History   Socioeconomic History  . Marital status: Single    Spouse name: Not on file  . Number of children: 3  . Years of education: 54  . Highest education level: Not on file  Occupational History    Comment: unemployed  Social Needs  . Financial resource strain: Not on file  . Food insecurity:    Worry: Not on file    Inability: Not on file  . Transportation needs:    Medical: Not on file    Non-medical: Not on file  Tobacco Use  . Smoking status: Current Every Day Smoker    Packs/day:  2.00    Years: 17.00    Pack years: 34.00    Types: Cigarettes, Cigars  . Smokeless tobacco: Former Systems developer  . Tobacco comment: occassional snuff   Substance and Sexual Activity  . Alcohol use: No    Alcohol/week: 0.0 standard drinks    Comment: last drinked in 3 months.   . Drug use: No  . Sexual activity: Not Currently  Lifestyle  . Physical activity:    Days per week: Not on file    Minutes per session: Not on file  . Stress: Not on file  Relationships  . Social connections:    Talks on phone: Not on file    Gets together: Not on file    Attends religious service: Not on file    Active member of club or organization: Not on file    Attends meetings of clubs or organizations: Not on file    Relationship status: Not on file  Other Topics Concern  . Not on file  Social History Narrative   Patient is right handed.   Patient drinks 2 sodas daily.   Additional Social History:   Lives with mother, sister and patient's children.   Allergies:   Allergies  Allergen Reactions  . Prednisone Other (See Comments)    Hypertension and makes him feel "spacey"  .  Hydrocodone Other (See Comments)    Causes headaches and makes the patient irritable  . Trazodone And Nefazodone Other (See Comments)    Patient stated that this keeps him awake; "has an opposite effect on me"  . Varenicline Other (See Comments)    Suicidal thoughts    Labs:  Results for orders placed or performed during the hospital encounter of 05/22/18 (from the past 48 hour(s))  Comprehensive metabolic panel     Status: Abnormal   Collection Time: 05/22/18 11:13 AM  Result Value Ref Range   Sodium 133 (L) 135 - 145 mmol/L   Potassium 3.9 3.5 - 5.1 mmol/L   Chloride 102 98 - 111 mmol/L   CO2 19 (L) 22 - 32 mmol/L   Glucose, Bld 232 (H) 70 - 99 mg/dL   BUN 8 6 - 20 mg/dL   Creatinine, Ser 1.03 0.61 - 1.24 mg/dL   Calcium 9.4 8.9 - 10.3 mg/dL   Total Protein 7.5 6.5 - 8.1 g/dL   Albumin 4.6 3.5 - 5.0 g/dL   AST 35 15 - 41 U/L   ALT 28 0 - 44 U/L   Alkaline Phosphatase 115 38 - 126 U/L   Total Bilirubin 0.6 0.3 - 1.2 mg/dL   GFR calc non Af Amer >60 >60 mL/min   GFR calc Af Amer >60 >60 mL/min   Anion gap 12 5 - 15    Comment: Performed at [facility], [address], [place], [place] [address]  Ethanol     Status: None   Collection Time: 05/22/18 11:13 AM  Result Value Ref Range   Alcohol, Ethyl (B) <10 <10 mg/dL    Comment: (NOTE) Lowest detectable limit for serum alcohol is 10 mg/dL. For medical purposes only. Performed at [facility] [address] [place], [place] [address]   cbc     Status: Abnormal   Collection Time: 05/22/18 11:13 AM  Result Value Ref Range   WBC 9.6 4.0 - 10.5 K/uL   RBC 4.94 4.22 - 5.81 MIL/uL   Hemoglobin 15.6 13.0 - 17.0 g/dL   HCT 45.6 39.0 - 52.0 %   MCV  92.3 80.0 - 100.0 fL   MCH 31.6 26.0 - 34.0 pg   MCHC 34.2 30.0 - 36.0 g/dL   RDW 15.9 (H) 11.5 - 15.5 %   Platelets 223 150 - 400 K/uL   nRBC 0.0 0.0 - 0.2 %    Comment: Performed at [facility], [address] [place], [place] [address]     Current Facility-Administered Medications  Medication Dose Route Frequency Provider Last Rate Last Dose  . aspirin EC tablet 81 mg  81 mg Oral Daily Lavella Hammock, MD      . atorvastatin (LIPITOR) tablet 80 mg  80 mg Oral Daily Lavella Hammock, MD      . carvedilol (COREG) tablet 6.25 mg  6.25 mg Oral BID WC Lavella Hammock, MD      . clonazePAM Bobbye Charleston) disintegrating tablet 0.5 mg  0.5 mg Oral Once Lavella Hammock, MD       Followed by  . clonazePAM Bobbye [place]) tablet 0.5 mg  0.5 mg Oral BID Lavella Hammock, MD      . cloNIDine (CATAPRES) tablet 0.1 mg  0.1 mg Oral TID Lavella Hammock, MD      . diphenhydrAMINE (BENADRYL) tablet 50 mg  50 mg Oral QHS Lavella Hammock, MD      . divalproex (DEPAKOTE ER) 24 hr tablet 1,000 mg  1,000 mg Oral QHS Lavella Hammock, MD      . divalproex (DEPAKOTE) DR tablet 500 mg  500 mg Oral Doroteo Bradford, MD      . fluPHENAZine (PROLIXIN) tablet 15 mg  15 mg Oral QHS Lavella Hammock, MD      . fluPHENAZine (PROLIXIN) tablet 5 mg  5 mg Oral Daily Lavella Hammock, MD      . fluvoxaMINE (LUVOX) tablet 100 mg  100 mg Oral QHS Lavella Hammock, MD      . fluvoxaMINE (LUVOX) tablet 50 mg  50 mg Oral q morning - 10a Lavella Hammock, MD      . gabapentin (NEURONTIN) capsule 900 mg  900 mg Oral TID Lavella Hammock, MD      . ibuprofen (ADVIL,MOTRIN) tablet 600 mg  600 mg Oral Q6H PRN Lavella Hammock, MD      . lamoTRIgine (LAMICTAL) tablet 100 mg  100 mg Oral Daily Lavella Hammock, MD      . metFORMIN (GLUCOPHAGE) tablet 500 mg  500 mg Oral BID WC Lavella Hammock, MD      . rOPINIRole (REQUIP) tablet 0.25 mg  0.25 mg Oral QHS Lavella Hammock, MD       Current Outpatient Medications  Medication Sig Dispense Refill  . aspirin 81 MG EC tablet Take 1 tablet (81 mg total) by mouth daily. For heart health 30 tablet 1  . atorvastatin (LIPITOR) 80 MG tablet Take 1 tablet (80 mg total) by mouth daily at 6 PM. For high cholesterol (Patient  taking differently: Take 80 mg by mouth daily. For high cholesterol) 30 tablet 0  . carvedilol (COREG) 6.25 MG tablet Take 1 tablet (6.25 mg total) by mouth 2 (two) times daily with a meal. For high blood pressure 60 tablet 0  . cloNIDine (CATAPRES) 0.1 MG tablet Take 1 tablet (0.1 mg total) by mouth 3 (three) times daily for 30 days. 90 tablet 0  . diphenhydrAMINE (BENADRYL ALLERGY) 25 MG tablet Take 2 tablets (50 mg total) by mouth at bedtime for 30 days. [place]  tablet 0  . divalproex (DEPAKOTE ER) 500 MG 24 hr tablet Take 2 tablets (1,000 mg total) by mouth at bedtime. 60 tablet 0  . fluPHENAZine (PROLIXIN) 5 MG tablet Take 3 tablets (15 mg total) by mouth at bedtime. 90 tablet 0  . fluPHENAZine (PROLIXIN) 5 MG tablet Take 1 tablet (5 mg total) by mouth daily. In the morning 30 tablet 0  . fluvoxaMINE (LUVOX) 100 MG tablet Take 1 tablet (100 mg total) by mouth at bedtime. 30 tablet 0  . gabapentin (NEURONTIN) 300 MG capsule Take 3 capsules (900 mg total) by mouth 3 (three) times daily. 270 capsule 0  . ibuprofen (ADVIL,MOTRIN) 800 MG tablet Take 2,400 mg by mouth 3 (three) times daily as needed.    . lamoTRIgine (LAMICTAL) 100 MG tablet Take 1 tablet (100 mg total) by mouth daily. 30 tablet 0  . metFORMIN (GLUCOPHAGE) 500 MG tablet Take 1 tablet (500 mg total) by mouth 2 (two) times daily with a meal. For diabetes management    . nitroGLYCERIN (NITROSTAT) 0.4 MG SL tablet Place 1 tablet (0.4 mg total) under the tongue every 5 (five) minutes x 3 doses as needed. For chest pain 10 tablet 0  . rOPINIRole (REQUIP) 0.25 MG tablet Take 1 tablet (0.25 mg total) by mouth at bedtime for 30 days. 30 tablet 1    Musculoskeletal: Strength & Muscle Tone: within normal limits Gait & Station: normal Patient leans: N/A  Psychiatric Specialty Exam: Physical Exam  Nursing note and vitals reviewed. Constitutional: He is oriented to person, place, and time. He appears well-developed and well-nourished.  HENT:   Head: Normocephalic.  Eyes: EOM are normal.  Neck: Normal range of motion.  Cardiovascular: Normal rate.  Respiratory: Effort normal.  Musculoskeletal: Normal range of motion.  Neurological: He is alert and oriented to person, place, and time. He has normal reflexes.  Skin: Skin is warm and dry.    Review of Systems  Constitutional: Negative.   HENT: Negative.   Eyes: Negative.   Respiratory: Negative.   Cardiovascular: Negative.   Gastrointestinal: Negative.   Genitourinary: Negative.   Musculoskeletal: Positive for neck pain.  Skin: Negative.   Neurological: Negative for headaches.  Endo/Heme/Allergies: Negative.   Psychiatric/Behavioral: Positive for depression. Negative for hallucinations, substance abuse and suicidal ideas. The patient is not nervous/anxious and does not have insomnia.   All other systems reviewed and are negative.   Blood pressure (!) 166/105, pulse (!) 107, temperature 98.7 F (37.1 C), temperature source Oral, resp. rate 18, height 6\' 2"  (1.88 m), weight 96 kg, SpO2 97 %.Body mass index is 27.17 kg/m.  General Appearance: Neat  Eye Contact:  Good  Speech:  Clear and Coherent  Volume:  Normal  Mood:  Dysphoric, Hopeless and Irritable  Affect:  Appropriate and Congruent  Thought Process:  Coherent  Orientation:  Full (Time, Place, and Person)  Thought Content:  Logical  Suicidal Thoughts:  Yes.  without intent/plan  Homicidal Thoughts:  No  Memory:  Immediate;   Fair  Judgement:  Fair  Insight:  Fair  Psychomotor Activity:  Normal  Concentration:  Concentration: Fair  Recall:  [employer] of Knowledge:  Fair  Language:  Good  Akathisia:  Negative  Handed:  Right  AIMS (if indicated):   0  Assets:  Social Support  ADL's:  Intact  Cognition:  WNL  Sleep:   improved    Treatment Plan Summary: Medication management  Continue Clonidine 0.1 mg three times a  day as needed for agitation and ADHD, can be transitioned to 0.1 mg at bedtime with  0.1 mg use twice daily as needed. Continue Prolixin 5 mg in the morning and 15 mg at night for mood stabilization, neck movements and sleep.   Adjustments can be made by outpatient psychiatrist as indicated.  Patient can also be transitioned to Prolixin decanoate for monthly injections with a p.o. dose maintained at bedtime. Continue Luvox 100 mg at bedtime for depression, anxiety and impulsivity.   Start Luvox 50 mg daily in the morning.   This can be increased by 50 mg every 1 to 2 weeks up to a total of 300 mg daily in divided dosing with larger dose given at evening dose. Change to Depakote DR 1000 mg at bedtime for mood stabilization, sleep, impulsivity and nerve pain. Add Depakote DR 500 mg daily in the morning. Continue Benadryl 50 mg at night for sleep and to decrease side effect of antipsychotic Start Klonopin 0.5 mg BID for anxiety.  Continue Requip 0.25 mg at bedtime for restless leg syndrome.  Patient should follow-up with outpatient primary care doctor for continuation of this medication.   Disposition: Supportive therapy provided about ongoing stressors. Medication adjustments made as above.  Will monitor overnight to evaluate for effectiveness.    Lavella Hammock, MD 05/22/2018 11:55 AM

## 2018-05-22 NOTE — ED Triage Notes (Signed)
Pt arrives to ER with sister c/o being suicidal. Pt shouting "help" throughout lobby. Pt states that he wants to be placed in a group home and wants medciations to calm him down. Pt cooperative with triage. Pt reports he wants to cut his wrists. Pt hit window prior to arrival with right hand, bruising present. Denies HI.

## 2018-05-22 NOTE — BH Assessment (Addendum)
Assessment Note  Michael Schmitt is an 48 y.o. male who presents to ED endorsing suicidal ideations. Pt reports he has a plan to cut his wrist. He reports having access to sharp objects in his home. He denied HI/AVH. Pt reports medication compliance. Pt has been yelling "help" in the ED; staff has tried to assist pt with his needs.   Diagnosis: Depression  Past Medical History:  Past Medical History:  Diagnosis Date  . Anxiety   . Anxiety   . Arthritis    cerv. stenosis, spondylosis, HNP- lower back , has been followed in pain clinic, has  had injection s in cerv. area  . Blood dyscrasia    told that when he was young he was a" free bleeder"  . CAD (coronary artery disease)   . Cervical spondylosis without myelopathy 07/24/2014  . Cervicogenic headache 07/24/2014  . Chronic kidney disease    renal calculi- passed spontaneously  . Depression   . Diabetes mellitus without complication ([place])   . Fatty liver   . GERD (gastroesophageal reflux disease)   . Headache(784.0)   . Hyperlipidemia   . Hypertension   . Mental disorder   . MI, old   . RLS (restless legs syndrome)    detected on sleep study  . Shortness of breath   . Ventricular fibrillation ([place]) 04/05/2016    Past Surgical History:  Procedure Laterality Date  . ANTERIOR CERVICAL DECOMP/DISCECTOMY FUSION  11/13/2011   Procedure: ANTERIOR CERVICAL DECOMPRESSION/DISCECTOMY FUSION 1 LEVEL;  Surgeon: [name], MD;  Location: [place] [facility];  Service: Neurosurgery;  Laterality: N/A;  Anterior Cervical Decompression/discectomy Fusion. Cervical three-four.  Marland Kitchen CARDIAC CATHETERIZATION N/A 01/01/2015   Procedure: Left Heart Cath and Coronary Angiography;  Surgeon: [name], MD;  Location: [place] [facility];  Service: Cardiovascular;  Laterality: N/A;  . CARDIAC CATHETERIZATION N/A 04/05/2016   Procedure: Left Heart Cath and Coronary Angiography;  Surgeon: Belva Crome, MD;  Location: [place] [facility];  Service:  Cardiovascular;  Laterality: N/A;  . CARDIAC CATHETERIZATION N/A 04/05/2016   Procedure: Coronary Stent Intervention;  Surgeon: Belva Crome, MD;  Location: [place] [facility];  Service: Cardiovascular;  Laterality: N/A;  . CARDIAC CATHETERIZATION N/A 04/05/2016   Procedure: Intravascular Ultrasound/IVUS;  Surgeon: Belva Crome, MD;  Location: [place] [facility];  Service: Cardiovascular;  Laterality: N/A;  . NASAL SINUS SURGERY     2005    Family History:  Family History  Problem Relation Age of Onset  . Prostate cancer Father   . Hypertension Mother   . Kidney Stones Mother   . Anxiety disorder Mother   . Depression Mother   . COPD Sister   . Hypertension Sister   . Diabetes Sister   . Depression Sister   . Anxiety disorder Sister   . Seizures Sister   . ADD / ADHD Son   . ADD / ADHD Daughter     Social History:  reports that he has been smoking cigarettes and cigars. He has a 34.00 pack-year smoking history. He has quit using smokeless tobacco. He reports that he does not drink alcohol or use drugs.  Additional Social History:  Alcohol / Drug Use Pain Medications: See PTA Prescriptions: See PTA Over the Counter: See PTA History of alcohol / drug use?: No history of alcohol / drug abuse Longest period of sobriety (when/how long): Reports of no current use  CIWA: CIWA-Ar BP: (!) 166/105 Pulse Rate: (!) 107 COWS:  Allergies:  Allergies  Allergen Reactions  . Prednisone Other (See Comments)    Hypertension and makes him feel "spacey"  . Hydrocodone Other (See Comments)    Causes headaches and makes the patient irritable  . Trazodone And Nefazodone Other (See Comments)    Patient stated that this keeps him awake; "has an opposite effect on me"  . Varenicline Other (See Comments)    Suicidal thoughts    Home Medications: (Not in a hospital admission)   OB/GYN Status:  No LMP for male patient.  General Assessment Data Location of Assessment: [facility] ED TTS  Assessment: In system Is this a Tele or Face-to-Face Assessment?: Face-to-Face Is this an Initial Assessment or a Re-assessment for this encounter?: Re-Assessment Patient Accompanied by:: N/A Language Other than English: No Living Arrangements: Homeless/Shelter What gender do you identify as?: Male Marital status: Single Maiden name: n/a Pregnancy Status: No Living Arrangements: Parent, Other relatives Can pt return to current living arrangement?: Yes Admission Status: Voluntary Is patient capable of signing voluntary admission?: Yes Referral Source: Self/Family/Friend Insurance type: [facility] MCR  Medical Screening Exam ([place]) Medical Exam completed: Yes  Crisis Care Plan Living Arrangements: Parent, Other relatives Legal Guardian: (self) Name of Psychiatrist: Reports of none Name of Therapist: Julian  Education Status Is patient currently in school?: No Is the patient employed, unemployed or receiving disability?: Receiving disability income, Unemployed  Risk to self with the past 6 months Suicidal Ideation: Yes-Currently Present Has patient been a risk to self within the past 6 months prior to admission? : Yes Suicidal Intent: No Has patient had any suicidal intent within the past 6 months prior to admission? : Yes Is patient at risk for suicide?: Yes Suicidal Plan?: Yes-Currently Present Has patient had any suicidal plan within the past 6 months prior to admission? : Yes Specify Current Suicidal Plan: Cut wrist Access to Means: Yes Specify Access to Suicidal Means: Access to sharp objects What has been your use of drugs/alcohol within the last 12 months?: NOne Reported Previous Attempts/Gestures: No How many times?: 2 Other Self Harm Risks: Reports of none Triggers for Past Attempts: None known Intentional Self Injurious Behavior: None Family Suicide History: No Recent stressful life event(s): Other (Comment) Persecutory voices/beliefs?: No Depression:  Yes Depression Symptoms: Feeling worthless/self pity, Fatigue, Isolating, Guilt Substance abuse history and/or treatment for substance abuse?: No Suicide prevention information given to non-admitted patients: Not applicable  Risk to Others within the past 6 months Homicidal Ideation: No Does patient have any lifetime risk of violence toward others beyond the six months prior to admission? : No Thoughts of Harm to Others: No Current Homicidal Intent: No Current Homicidal Plan: No Access to Homicidal Means: No Identified Victim: Reports of none History of harm to others?: No Assessment of Violence: None Noted Violent Behavior Description: Reports of none Does patient have access to weapons?: No Criminal Charges Pending?: No Does patient have a court date: No Is patient on probation?: No  Psychosis Hallucinations: None noted Delusions: None noted  Mental Status Report Appearance/Hygiene: Unremarkable, In scrubs Eye Contact: Fair Motor Activity: Freedom of movement Speech: Logical/coherent Level of Consciousness: Alert Mood: Sad, Depressed Affect: Anxious, Depressed, Appropriate to circumstance Anxiety Level: Minimal Thought Processes: Coherent, Relevant Judgement: Unimpaired Orientation: Person, Place, Time, Situation, Appropriate for developmental age Obsessive Compulsive Thoughts/Behaviors: Minimal  Cognitive Functioning Concentration: Normal Memory: Recent Intact, Remote Intact Is patient IDD: No Insight: Fair Impulse Control: Fair Appetite: Fair Have you had any weight changes? : No  Change Sleep: No Change Total Hours of Sleep: 6 Vegetative Symptoms: None  ADLScreening [facility] Assessment Services) Patient's cognitive ability adequate to safely complete daily activities?: Yes Patient able to express need for assistance with ADLs?: Yes Independently performs ADLs?: Yes (appropriate for developmental age)  Prior Inpatient Therapy Prior Inpatient Therapy: Yes Prior  Therapy Dates: Multiple Hospitalizations, most recent 03/2018 Prior Therapy Facilty/Provider(s): [facility] Reason for Treatment: Depression  Prior Outpatient Therapy Prior Outpatient Therapy: Yes Prior Therapy Dates: current Prior Therapy Facilty/Provider(s): [facility] Reason for Treatment: depression Does patient have an ACCT team?: No Does patient have Intensive In-House Services?  : No Does patient have [facility] services? : No Does patient have P4CC services?: No  ADL Screening (condition at time of admission) Patient's cognitive ability adequate to safely complete daily activities?: Yes Patient able to express need for assistance with ADLs?: Yes Independently performs ADLs?: Yes (appropriate for developmental age)       Abuse/Neglect Assessment (Assessment to be complete while patient is alone) Abuse/Neglect Assessment Can Be Completed: Yes Physical Abuse: Denies Verbal Abuse: Denies Sexual Abuse: Denies Exploitation of patient/patient's resources: Denies Self-Neglect: Denies Values / Beliefs Cultural Requests During Hospitalization: None Spiritual Requests During Hospitalization: None Consults Spiritual Care Consult Needed: No Social Work Consult Needed: No Regulatory affairs officer (For Healthcare) Does Patient Have a Medical Advance Directive?: No       Child/Adolescent Assessment Running Away Risk: (Patient is an adult)  Disposition:  Disposition Initial Assessment Completed for this Encounter: Yes Disposition of Patient: (Observe overnight)  On Site Evaluation by:   Reviewed with Physician:    Frederich Cha 05/22/2018 7:44 PM

## 2018-05-22 NOTE — ED Notes (Signed)
Pt. Awake watching tv in room.  Pt. Calm and cooperative at this time.  Pt. Requesting light turned off in room.  Pt. Also asked what time it was and if he would be getting his medications tonight.  Pt. Told his medications were put in and he would be getting them around 9-10 this evening.  Pt. Ok will this and just wants to watch tv right now.  Pt. Asked if he wanted snack, pt. Declined for now.

## 2018-05-22 NOTE — ED Provider Notes (Signed)
[facility] Emergency Department Provider Note   ____________________________________________   First MD Initiated Contact with Patient 05/22/18 1119     (approximate)  I have reviewed the triage vital signs and the nursing notes.   HISTORY  Chief Complaint Suicidal    HPI Michael Schmitt is a 48 y.o. male patient comes in complaining of feeling suicidal.  Psychiatry has seen him already.  She is adjusting his medicines and is going to talk to him some.  She has taken out papers on him.  Patient denies any other medical problems headache nausea vomiting chest pain etc.   Past Medical History:  Diagnosis Date  . Anxiety   . Anxiety   . Arthritis    cerv. stenosis, spondylosis, HNP- lower back , has been followed in pain clinic, has  had injection s in cerv. area  . Blood dyscrasia    told that when he was young he was a" free bleeder"  . CAD (coronary artery disease)   . Cervical spondylosis without myelopathy 07/24/2014  . Cervicogenic headache 07/24/2014  . Chronic kidney disease    renal calculi- passed spontaneously  . Depression   . Diabetes mellitus without complication ([place])   . Fatty liver   . GERD (gastroesophageal reflux disease)   . Headache(784.0)   . Hyperlipidemia   . Hypertension   . Mental disorder   . MI, old   . RLS (restless legs syndrome)    detected on sleep study  . Shortness of breath   . Ventricular fibrillation ([place]) 04/05/2016    Patient Active Problem List   Diagnosis Date Noted  . Overdose 05/17/2018  . Depression, major, recurrent, severe with psychosis ([place]) 03/30/2018  . Personality disorder ([place]) 12/31/2017  . MDD (major depressive disorder), recurrent episode, severe ([place]) 11/20/2017  . Dysthymia 10/26/2017  . Severe recurrent major depression without psychotic features ([place]) 03/12/2017  . Generalized anxiety disorder 02/05/2017  . Chronic neck pain (Primary Area of Pain) (Left) 11/22/2016  .  Cervical facet syndrome (Left) 11/22/2016  . Cervical radiculitis (Left) 11/22/2016  . History of cervical spinal surgery 11/22/2016  . Cervical spondylosis 11/22/2016  . Chronic shoulder pain (Secondary Area of Pain) (Left) 11/22/2016  . Arthropathy of shoulder (Left) 11/22/2016  . Chronic low back pain [facility] Area of Pain) (Bilateral) (L>R) 11/22/2016  . Lumbar facet syndrome (Bilateral) (L>R) 11/22/2016  . Chronic pain syndrome 11/21/2016  . Long term prescription benzodiazepine use 11/21/2016  . Opiate use 11/21/2016  . Uncontrolled type 2 diabetes mellitus without complication, without long-term current use of insulin ([place]) 10/19/2016  . Chronic Suicidal ideation 09/15/2016  . Coronary artery disease 09/12/2016  . HTN (hypertension) 06/26/2016  . GERD (gastroesophageal reflux disease) 06/26/2016  . Alcohol use disorder, severe, 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[place]) 06/26/2016  . Benzodiazepine abuse ([place]) 06/11/2016  . Sedative, hypnotic or anxiolytic use disorder, severe, dependence ([place]) 05/12/2016  . Ventricular fibrillation ([place]) 04/05/2016  . Combined hyperlipidemia 01/05/2016  . Cervical spondylosis without myelopathy 07/24/2014  . Severe episode of recurrent major depressive disorder, without psychotic features ([place]) 04/15/2013  . Tobacco use disorder 10/20/2010  . Asthma 10/20/2010    Past Surgical History:  Procedure Laterality Date  . ANTERIOR CERVICAL DECOMP/DISCECTOMY FUSION  11/13/2011   Procedure: ANTERIOR CERVICAL DECOMPRESSION/DISCECTOMY FUSION 1 LEVEL;  Surgeon: [name], MD;  Location: [place] [facility];  Service: Neurosurgery;  Laterality: N/A;  Anterior Cervical Decompression/discectomy Fusion. Cervical three-four.  Marland Kitchen CARDIAC CATHETERIZATION N/A 01/01/2015  Procedure: Left Heart Cath and Coronary Angiography;  Surgeon: [name], MD;  Location: [place] [facility];  Service: Cardiovascular;  Laterality: N/A;  . CARDIAC CATHETERIZATION N/A 04/05/2016   Procedure: Left  Heart Cath and Coronary Angiography;  Surgeon: Belva Crome, MD;  Location: [place] [facility];  Service: Cardiovascular;  Laterality: N/A;  . CARDIAC CATHETERIZATION N/A 04/05/2016   Procedure: Coronary Stent Intervention;  Surgeon: Belva Crome, MD;  Location: [place] [facility];  Service: Cardiovascular;  Laterality: N/A;  . CARDIAC CATHETERIZATION N/A 04/05/2016   Procedure: Intravascular Ultrasound/IVUS;  Surgeon: Belva Crome, MD;  Location: [place] [facility];  Service: Cardiovascular;  Laterality: N/A;  . NASAL SINUS SURGERY     2005    Prior to Admission medications   Medication Sig Start Date End Date Taking? Authorizing Provider  aspirin 81 MG EC tablet Take 1 tablet (81 mg total) by mouth daily. For heart health 11/21/17   Lindell Spar I, NP  atorvastatin (LIPITOR) 80 MG tablet Take 1 tablet (80 mg total) by mouth daily at 6 PM. For high cholesterol Patient taking differently: Take 80 mg by mouth daily. For high cholesterol 11/21/17   Lindell Spar I, NP  carvedilol (COREG) 6.25 MG tablet Take 1 tablet (6.25 mg total) by mouth 2 (two) times daily with a meal. For high blood pressure 11/21/17   Nwoko, Herbert Pun I, NP  cloNIDine (CATAPRES) 0.1 MG tablet Take 1 tablet (0.1 mg total) by mouth 3 (three) times daily for 30 days. 05/16/18 06/15/18  Lavella Hammock, MD  diphenhydrAMINE (BENADRYL ALLERGY) 25 MG tablet Take 2 tablets (50 mg total) by mouth at bedtime for 30 days. 05/16/18 06/15/18  Lavella Hammock, MD  divalproex (DEPAKOTE ER) 500 MG 24 hr tablet Take 2 tablets (1,000 mg total) by mouth at bedtime. 05/16/18   Lavella Hammock, MD  fluPHENAZine (PROLIXIN) 5 MG tablet Take 3 tablets (15 mg total) by mouth at bedtime. 05/16/18   Lavella Hammock, MD  fluPHENAZine (PROLIXIN) 5 MG tablet Take 1 tablet (5 mg total) by mouth daily. In the morning 05/17/18   Lavella Hammock, MD  fluvoxaMINE (LUVOX) 100 MG tablet Take 1 tablet (100 mg total) by mouth at bedtime. 05/16/18   Lavella Hammock, MD    gabapentin (NEURONTIN) 300 MG capsule Take 3 capsules (900 mg total) by mouth 3 (three) times daily. 04/01/18   Clapacs, Madie Reno, MD  ibuprofen (ADVIL,MOTRIN) 800 MG tablet Take 2,400 mg by mouth 3 (three) times daily as needed.    [provider]  lamoTRIgine (LAMICTAL) 100 MG tablet Take 1 tablet (100 mg total) by mouth daily. 04/02/18   Clapacs, Madie Reno, MD  metFORMIN (GLUCOPHAGE) 500 MG tablet Take 1 tablet (500 mg total) by mouth 2 (two) times daily with a meal. For diabetes management 11/21/17   Lindell Spar I, NP  nitroGLYCERIN (NITROSTAT) 0.4 MG SL tablet Place 1 tablet (0.4 mg total) under the tongue every 5 (five) minutes x 3 doses as needed. For chest pain 11/21/17 11/21/18  Lindell Spar I, NP  rOPINIRole (REQUIP) 0.25 MG tablet Take 1 tablet (0.25 mg total) by mouth at bedtime for 30 days. 05/08/18 06/07/18  Lavella Hammock, MD    Allergies Prednisone; Hydrocodone; Trazodone and nefazodone; and Varenicline  Family History  Problem Relation Age of Onset  . Prostate cancer Father   . Hypertension Mother   . Kidney Stones Mother   . Anxiety disorder Mother   .  Depression Mother   . COPD Sister   . Hypertension Sister   . Diabetes Sister   . Depression Sister   . Anxiety disorder Sister   . Seizures Sister   . ADD / ADHD Son   . ADD / ADHD Daughter     Social History Social History   Tobacco Use  . Smoking status: Current Every Day Smoker    Packs/day: 2.00    Years: 17.00    Pack years: 34.00    Types: Cigarettes, Cigars  . Smokeless tobacco: Former Systems developer  . Tobacco comment: occassional snuff   Substance Use Topics  . Alcohol use: No    Alcohol/week: 0.0 standard drinks    Comment: last drinked in 3 months.   . Drug use: No    Review of Systems  Constitutional: No fever/chills Eyes: No visual changes. ENT: No sore throat. Cardiovascular: Denies chest pain. Respiratory: Denies shortness of breath. Gastrointestinal: No abdominal pain.  No nausea, no  vomiting.  No diarrhea.  No constipation. Genitourinary: Negative for dysuria. Musculoskeletal: Negative for back pain. Skin: Negative for rash. Neurological: Negative for headaches, focal weakness   ____________________________________________   PHYSICAL EXAM:  VITAL SIGNS: ED Triage Vitals [05/22/18 1111]  Enc Vitals Group     BP (!) 166/105     Pulse Rate (!) 107     Resp 18     Temp 98.7 F (37.1 C)     Temp Source Oral     SpO2 97 %     Weight 211 lb 10.3 oz (96 kg)     Height 6\' 2"  (1.88 m)     Head Circumference      Peak Flow      Pain Score 8     Pain Loc      Pain Edu?      Excl. in [place]?     Constitutional: Alert and oriented. Well appearing and in no acute distress. Eyes: Conjunctivae are normal.  Head: Atraumatic. Nose: No congestion/rhinnorhea. Mouth/Throat: Mucous membranes are moist.  Oropharynx non-erythematous. Neck: No stridor.  Cardiovascular: Normal rate, regular rhythm. Grossly normal heart sounds.  Good peripheral circulation. Respiratory: Normal respiratory effort.  No retractions. Lungs CTAB. Gastrointestinal: Soft and nontender. No distention. No abdominal bruits. No CVA tenderness. Musculoskeletal: No lower extremity tenderness nor edema.  Neurologic:  Normal speech and language. No gross focal neurologic deficits are appreciated.  Skin:  Skin is warm, dry and intact. No rash noted.   ____________________________________________   LABS (all labs ordered are listed, but only abnormal results are displayed)  Labs Reviewed  COMPREHENSIVE METABOLIC PANEL - Abnormal; Notable for the following components:      Result Value   Sodium 133 (*)    CO2 19 (*)    Glucose, Bld 232 (*)    All other components within normal limits  ACETAMINOPHEN LEVEL - Abnormal; Notable for the following components:   Acetaminophen (Tylenol), Serum <10 (*)    All other components within normal limits  CBC - Abnormal; Notable for the following components:   RDW  15.9 (*)    All other components within normal limits  ETHANOL  SALICYLATE LEVEL  URINE DRUG SCREEN, QUALITATIVE ([facility] ONLY)  VALPROIC ACID LEVEL   ____________________________________________  EKG   ____________________________________________  RADIOLOGY  ED MD interpretation:    Official radiology report(s): No results found.  ____________________________________________   PROCEDURES  Procedure(s) performed (including Critical Care):  Procedures   ____________________________________________   INITIAL IMPRESSION / ASSESSMENT  AND PLAN / ED COURSE        ____________________________________________   FINAL CLINICAL IMPRESSION(S) / ED DIAGNOSES  Final diagnoses:  Depression, unspecified depression type     ED Discharge Orders    None       Note:  This document was prepared using Dragon voice recognition software and may include unintentional dictation errors.    Nena Polio, MD 05/22/18 [phone]

## 2018-05-22 NOTE — ED Notes (Signed)
VOL/ Consult ordered

## 2018-05-22 NOTE — ED Notes (Signed)
TTS talking with Patient at this time.

## 2018-05-22 NOTE — ED Notes (Signed)
Dr. Leverne Humbles is in talking to Patient, Patient is cooperative.

## 2018-05-22 NOTE — ED Notes (Addendum)
Blood taken by Elmyra Ricks, EDT. Pt cooperative. Dressed out by this Therapist, sports and Elmyra Ricks, EDT. Yellow collared short sleeve shirt, black tennis shoes, white socks, jeans and plaid underwear placed in patient belonging bag.  Michael Butts, RN aware of patient arrival and gives room number for patient. Aware of orders of 1:1 SI order placed.

## 2018-05-23 DIAGNOSIS — F332 Major depressive disorder, recurrent severe without psychotic features: Secondary | ICD-10-CM | POA: Diagnosis not present

## 2018-05-23 DIAGNOSIS — R45851 Suicidal ideations: Secondary | ICD-10-CM | POA: Diagnosis not present

## 2018-05-23 MED ORDER — FLUPHENAZINE HCL 5 MG PO TABS: 15.0000 mg | ORAL_TABLET | Freq: Every day | ORAL | 0 refills | Status: DC

## 2018-05-23 MED ORDER — FLUVOXAMINE MALEATE 50 MG PO TABS: 50.0000 mg | ORAL_TABLET | Freq: Every day | ORAL | 0 refills | Status: DC

## 2018-05-23 MED ORDER — DIVALPROEX SODIUM 500 MG PO DR TAB: DELAYED_RELEASE_TABLET | ORAL | 0 refills | Status: DC

## 2018-05-23 MED ORDER — CLONAZEPAM 0.5 MG PO TABS: 0.5000 mg | ORAL_TABLET | Freq: Two times a day (BID) | ORAL | 0 refills | Status: DC

## 2018-05-23 MED ORDER — FLUPHENAZINE DECANOATE 25 MG/ML IJ SOLN: 25.0000 mg | INTRAMUSCULAR | 0 refills | Status: DC

## 2018-05-23 MED ORDER — FLUPHENAZINE DECANOATE 25 MG/ML IJ SOLN
25.0000 mg | INTRAMUSCULAR | Status: DC
  Administered 2018-05-23: 25 mg via INTRAMUSCULAR
  Filled 2018-05-23: qty 1

## 2018-05-23 NOTE — ED Notes (Signed)
Patient walked down hallway to use bathroom due to bathroom being used for shower at this time

## 2018-05-23 NOTE — Progress Notes (Signed)
LCSW spoke to [facility] and faxed out [place] and facesheet to group home provider she will visit with patient today in ED room 23 this afternoon and contact the psychiatrist directly Dr Leverne Humbles  [phone]  Enis Slipper LCSW [phone]

## 2018-05-23 NOTE — ED Notes (Signed)
Patient signed the discharge paper and it was placed in the medical record box

## 2018-05-23 NOTE — Progress Notes (Signed)
Inpatient Diabetes Program Recommendations  AACE/ADA: New Consensus Statement on Inpatient Glycemic Control (2015)  Target Ranges:  Prepandial:   less than 140 mg/dL      Peak postprandial:   less than 180 mg/dL (1-2 hours)      Critically ill patients:  140 - 180 mg/dL   Results for Iwanicki, Coye TOD (MRN [id number]) as of 05/23/2018 09:19  Ref. Range 05/22/2018 11:13  Glucose Latest Ref Range: 70 - 99 mg/dL 232 (H)   Results for Terrien, Hakeem TOD (MRN [id number]) as of 05/23/2018 09:19  Ref. Range 05/18/2018 06:55  Hemoglobin A1C Latest Ref Range: 4.8 - 5.6 % 7.4 (H)    Admit with: Suicidal Ideation  History: DM  Home DM Meds: Metformin 500 mg BID  Current Orders: Metformin 500 mg BID     MD- Please consider placing orders for Novolog Sensitive Correction Scale/ SSI (0-9 units) TID AC + HS while patient awaiting placement  (Use Glycemic Control Order set)     --Will follow patient during hospitalization--  Wyn Quaker RN, MSN, CDE Diabetes Coordinator Inpatient Glycemic Control Team Team Pager: [phone] (8a-5p)

## 2018-05-23 NOTE — ED Notes (Signed)
Patient had a visit with the group home Director Marissa

## 2018-05-23 NOTE — NC FL2 (Signed)
[place] LEVEL OF CARE SCREENING TOOL     IDENTIFICATION  Patient Name: Michael Schmitt Birthdate: April 28, 1970 Sex: male Admission Date (Current Location): 05/22/2018  [place] and [place] Number:  Engineering geologist and Address:  [facility], [address], [place], [place] [address]      Provider Number: [id number]  Attending Physician Name and Address:  No att. providers found  Relative Name and Phone Number:       Current Level of Care: Other (Comment) Recommended Level of Care: Family Care Home Prior Approval Number:    Date Approved/Denied:   PASRR Number:    Discharge Plan: Other (Comment)    Current Diagnoses: Patient Active Problem List   Diagnosis Date Noted  . Overdose 05/17/2018  . Depression, major, recurrent, severe with psychosis ([place]) 03/30/2018  . Personality disorder ([place]) 12/31/2017  . MDD (major depressive disorder), recurrent episode, severe ([place]) 11/20/2017  . Dysthymia 10/26/2017  . Severe recurrent major depression without psychotic features ([place]) 03/12/2017  . Generalized anxiety disorder 02/05/2017  . Chronic neck pain (Primary Area of Pain) (Left) 11/22/2016  . Cervical facet syndrome (Left) 11/22/2016  . Cervical radiculitis (Left) 11/22/2016  . History of cervical spinal surgery 11/22/2016  . Cervical spondylosis 11/22/2016  . Chronic shoulder pain (Secondary Area of Pain) (Left) 11/22/2016  . Arthropathy of shoulder (Left) 11/22/2016  . Chronic low back pain [facility] Area of Pain) (Bilateral) (L>R) 11/22/2016  . Lumbar facet syndrome (Bilateral) (L>R) 11/22/2016  . Chronic pain syndrome 11/21/2016  . Long term prescription benzodiazepine use 11/21/2016  . Opiate use 11/21/2016  . Uncontrolled type 2 diabetes mellitus without complication, without long-term current use of insulin ([place]) 10/19/2016  . Chronic Suicidal ideation 09/15/2016  . Coronary artery disease 09/12/2016  . HTN  (hypertension) 06/26/2016  . GERD (gastroesophageal reflux disease) 06/26/2016  . Alcohol use disorder, severe, 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[place]) 06/26/2016  . Benzodiazepine abuse ([place]) 06/11/2016  . Sedative, hypnotic or anxiolytic use disorder, severe, dependence ([place]) 05/12/2016  . Ventricular fibrillation ([place]) 04/05/2016  . Combined hyperlipidemia 01/05/2016  . Cervical spondylosis without myelopathy 07/24/2014  . Severe episode of recurrent major depressive disorder, without psychotic features ([place]) 04/15/2013  . Tobacco use disorder 10/20/2010  . Asthma 10/20/2010    Orientation RESPIRATION BLADDER Height & Weight     Self, Time, Situation, Place  Normal Continent Weight: 211 lb 10.3 oz (96 kg) Height:  6\' 2"  (188 cm)  BEHAVIORAL SYMPTOMS/MOOD NEUROLOGICAL BOWEL NUTRITION STATUS      Continent Diet(Normal)  AMBULATORY STATUS COMMUNICATION OF NEEDS Skin   Independent   Normal                       Personal Care Assistance Level of Assistance  Bathing, Feeding, Dressing, Total care Bathing Assistance: Independent Feeding assistance: Independent Dressing Assistance: Independent Total Care Assistance: Independent   Functional Limitations Info  Sight, Hearing, Speech Sight Info: Adequate Hearing Info: Adequate Speech Info: Adequate    SPECIAL CARE FACTORS FREQUENCY                       Contractures Contractures Info: Not present    Additional Factors Info  Allergies   Allergies Info: Prednisone, Hydrocodone, Trazodone And Nefazodone, Varenicline           Current Medications (05/23/2018):  This is the current hospital active medication list Current Facility-Administered Medications  Medication Dose Route Frequency Provider Last Rate  Last Dose  . aspirin EC tablet 81 mg  81 mg Oral Daily Lavella Hammock, MD   81 mg at 05/22/18 1222  . atorvastatin (LIPITOR) tablet 80 mg  80 mg Oral Daily Lavella Hammock, MD      . carvedilol (COREG) tablet 6.25 mg  6.25  mg Oral BID WC Lavella Hammock, MD   6.25 mg at 05/23/18 0813  . clonazePAM (KLONOPIN) tablet 0.5 mg  0.5 mg Oral BID Lavella Hammock, MD   0.5 mg at 05/22/18 2117  . cloNIDine (CATAPRES) tablet 0.1 mg  0.1 mg Oral TID Lavella Hammock, MD   0.1 mg at 05/22/18 2117  . diphenhydrAMINE (BENADRYL) capsule 50 mg  50 mg Oral QHS Lavella Hammock, MD   50 mg at 05/22/18 2117  . divalproex (DEPAKOTE) DR tablet 1,000 mg  1,000 mg Oral QHS Lavella Hammock, MD   1,000 mg at 05/22/18 2119  . divalproex (DEPAKOTE) DR tablet 500 mg  500 mg Oral Doroteo Bradford, MD   500 mg at 05/23/18 0800  . fluPHENAZine (PROLIXIN) tablet 15 mg  15 mg Oral QHS Lavella Hammock, MD   15 mg at 05/22/18 2119  . fluPHENAZine (PROLIXIN) tablet 5 mg  5 mg Oral Daily Lavella Hammock, MD      . fluvoxaMINE (LUVOX) tablet 100 mg  100 mg Oral QHS Lavella Hammock, MD   100 mg at 05/22/18 2120  . fluvoxaMINE (LUVOX) tablet 50 mg  50 mg Oral q morning - 10a Lavella Hammock, MD      . gabapentin (NEURONTIN) capsule 900 mg  900 mg Oral TID Lavella Hammock, MD   900 mg at 05/22/18 2116  . ibuprofen (ADVIL,MOTRIN) tablet 600 mg  600 mg Oral Q6H PRN Lavella Hammock, MD      . lamoTRIgine (LAMICTAL) tablet 100 mg  100 mg Oral Daily Lavella Hammock, MD   100 mg at 05/22/18 1236  . metFORMIN (GLUCOPHAGE) tablet 500 mg  500 mg Oral BID WC Lavella Hammock, MD   500 mg at 05/23/18 0813  . rOPINIRole (REQUIP) tablet 0.25 mg  0.25 mg Oral QHS Lavella Hammock, MD   0.25 mg at 05/22/18 2120   Current Outpatient Medications  Medication Sig Dispense Refill  . albuterol (PROVENTIL HFA;VENTOLIN HFA) 108 (90 Base) MCG/ACT inhaler Inhale 2 puffs into the lungs every 6 (six) hours as needed for wheezing.    . clopidogrel (PLAVIX) 75 MG tablet Take 75 mg by mouth daily.    Marland Kitchen gemfibrozil (LOPID) 600 MG tablet Take 600 mg by mouth 2 (two) times daily.    Marland Kitchen lisinopril (PRINIVIL,ZESTRIL) 5 MG tablet Take 5 mg by mouth daily.    Marland Kitchen aspirin 81 MG  EC tablet Take 1 tablet (81 mg total) by mouth daily. For heart health 30 tablet 1  . atorvastatin (LIPITOR) 80 MG tablet Take 1 tablet (80 mg total) by mouth daily at 6 PM. For high cholesterol (Patient taking differently: Take 80 mg by mouth daily. For high cholesterol) 30 tablet 0  . busPIRone (BUSPAR) 15 MG tablet Take 15 mg by mouth daily.    . carvedilol (COREG) 6.25 MG tablet Take 1 tablet (6.25 mg total) by mouth 2 (two) times daily with a meal. For high blood pressure 60 tablet 0  . cloNIDine (CATAPRES) 0.1 MG tablet Take 1 tablet (0.1 mg total) by mouth 3 (three) times daily for 30  days. 90 tablet 0  . diphenhydrAMINE (BENADRYL ALLERGY) 25 MG tablet Take 2 tablets (50 mg total) by mouth at bedtime for 30 days. 60 tablet 0  . divalproex (DEPAKOTE ER) 500 MG 24 hr tablet Take 2 tablets (1,000 mg total) by mouth at bedtime. 60 tablet 0  . escitalopram (LEXAPRO) 10 MG tablet Take 10 mg by mouth daily.    . fluPHENAZine (PROLIXIN) 5 MG tablet Take 3 tablets (15 mg total) by mouth at bedtime. 90 tablet 0  . fluPHENAZine (PROLIXIN) 5 MG tablet Take 1 tablet (5 mg total) by mouth daily. In the morning 30 tablet 0  . fluvoxaMINE (LUVOX) 100 MG tablet Take 1 tablet (100 mg total) by mouth at bedtime. 30 tablet 0  . gabapentin (NEURONTIN) 300 MG capsule Take 3 capsules (900 mg total) by mouth 3 (three) times daily. 270 capsule 0  . ibuprofen (ADVIL,MOTRIN) 800 MG tablet Take 2,400 mg by mouth 3 (three) times daily as needed.    . lamoTRIgine (LAMICTAL) 100 MG tablet Take 1 tablet (100 mg total) by mouth daily. 30 tablet 0  . lamoTRIgine (LAMICTAL) 25 MG tablet Take 25 mg by mouth 2 (two) times daily.    . metFORMIN (GLUCOPHAGE) 500 MG tablet Take 1 tablet (500 mg total) by mouth 2 (two) times daily with a meal. For diabetes management    . nitroGLYCERIN (NITROSTAT) 0.4 MG SL tablet Place 1 tablet (0.4 mg total) under the tongue every 5 (five) minutes x 3 doses as needed. For chest pain 10 tablet 0  .  rOPINIRole (REQUIP) 0.25 MG tablet Take 1 tablet (0.25 mg total) by mouth at bedtime for 30 days. 30 tablet 1     Discharge Medications: Please see discharge summary for a list of discharge medications.  Relevant Imaging Results:  Relevant Lab Results:   Additional Information    Pauletta Browns, [place], [place]

## 2018-05-23 NOTE — ED Notes (Signed)
Patient discharged home with sister, patient received discharge papers and prescription. Patient received belongings and verbalized he has received all of his belongings. Patient appropriate and cooperative, Denies SI/HI AVH. Vital signs taken. NAD noted.

## 2018-05-23 NOTE — Consult Note (Signed)
[facility] Consult   Reason for Consult:  Suicidal Ideation Referring Physician:  Dr. Mariea Clonts Patient Identification: Michael Schmitt MRN:  [id number] Principal Diagnosis: Suicidal Ideation Diagnosis:  Suicidal Ideation  Patient is seen, chart is reviewed.  Patient had recent hospital admission from February 21 through May 20, 2018 for stabilization, however no medication changes made.  Patient has a history of noncompliance with medication.   Total Time spent with patient: 45 minutes   Subjective: " I got some sleep.  I feel like I can go home."  [name] is a 48 y.o. 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  Patient presented to ED "for help.  I want to move to a group home."  Patient is screaming in lobby, smacking himself in head. Yelling, "I need some help!"     During last emergency room stay, a family meeting was held and was shared decision making the following medication adjustments were made: Continue Clonidine 0.1 mg three times a day as needed for agitation and ADHD, can be transitioned to 0.1 mg at bedtime with 0.1 mg use twice daily as needed. Continue Prolixin 5 mg in the morning and 15 mg at night for mood stabilization, neck movements and sleep.   Adjustments can be made by outpatient psychiatrist as indicated.  Patient can also be transitioned to Prolixin decanoate for monthly injections with a p.o. dose maintained at bedtime. Continue Luvox 100 mg at bedtime for depression, anxiety and impulsivity.  This can be increased by 50 mg every 1 to 2 weeks up to a total of 300 mg daily in divided dosing with larger dose given at evening dose. Decrease Depakote ER 1000 mg at bedtime for mood stabilization, sleep, impulsivity and nerve pain. Continue Benadryl 50 mg at night for sleep and to decrease side effect of antipsychotic Continue Requip 0.25 mg at bedtime for  restless leg syndrome.  Patient should follow-up with outpatient primary care doctor for continuation of this medication. Of note patient does use nicotine at home, which could account for decreased effectiveness of medication at home.  Labs reviewed, valproic acid level was 18 (low)  On evaluation, patient is calm and cooperative, he is still having some episodes of head hitting.  Discussed with patient his desire to go to a group home.  Patient is uncertain, but willing to meet with the group home facility which is arranged for this afternoon and meeting is held with patient, patient's sister and group home director.  Patient is appreciative of restarting Klonopin 0.5 mg twice daily.  He does express a desire to have higher doses, but given his history of alcoholism and benzodiazepine dependence, it is reviewed with patient that he would be maintained at low doses.  Patient verbalizes understanding.  Meeting with sister by phone at which time she is informed that patient has not been taking medications as prescribed, and has not been taking Depakote given nearly undetectable level on arrival.  Sister reports that she is going to take over medication management for Michael Schmitt, in which she will keep all of his medications and dispense them as they are due.  Sister and patient have decided to have another try of patient being at home. Patient denies suicidal and homicidal thoughts.  He has not had any auditory or visual hallucinations.  He has not had any behavioral problems while in the emergency department.  Patient and sister are able to contract for safety.  Risk to Self: Suicidal Ideation: Yes-Currently Present Suicidal Intent: No Is patient at risk for suicide?: Yes Suicidal Plan?: Yes-Currently Present Specify Current Suicidal Plan: Cut wrist Access to Means: Yes Specify Access to Suicidal Means: Access to sharp objects What has been your use of drugs/alcohol within the last 12 months?: NOne  Reported How many times?: 2 Other Self Harm Risks: Reports of none Triggers for Past Attempts: None known Intentional Self Injurious Behavior: NoneYes Risk to Others: Homicidal Ideation: No Thoughts of Harm to Others: No Current Homicidal Intent: No Current Homicidal Plan: No Access to Homicidal Means: No Identified Victim: Reports of none History of harm to others?: No Assessment of Violence: None Noted Violent Behavior Description: Reports of none Does patient have access to weapons?: No Criminal Charges Pending?: No Does patient have a court date: NoDenies Prior Inpatient Therapy: Prior Inpatient Therapy: Yes Prior Therapy Dates: Multiple Hospitalizations, most recent 03/2018 Prior Therapy Facilty/Provider(s): [facility] Reason for Treatment: DepressionMultiple hospitalizations Prior Outpatient Therapy: Prior Outpatient Therapy: Yes Prior Therapy Dates: current Prior Therapy Facilty/Provider(s): [facility] Reason for Treatment: depression Does patient have an ACCT team?: No Does patient have Intensive In-House Services?  : No Does patient have [facility] services? : No Does patient have [facility] services?: [facility] for outpatient psychiatry medication management and therapy.  (Patient does have difficulty with co-pays and making appointments)  Past Medical History:  Past Medical History:  Diagnosis Date  . Anxiety   . Anxiety   . Arthritis    cerv. stenosis, spondylosis, HNP- lower back , has been followed in pain clinic, has  had injection s in cerv. area  . Blood dyscrasia    told that when he was young he was a" free bleeder"  . CAD (coronary artery disease)   . Cervical spondylosis without myelopathy 07/24/2014  . Cervicogenic headache 07/24/2014  . Chronic kidney disease    renal calculi- passed spontaneously  . Depression   . Diabetes mellitus without complication ([place])   . Fatty liver   . GERD (gastroesophageal reflux disease)   . Headache(784.0)   .  Hyperlipidemia   . Hypertension   . Mental disorder   . MI, old   . RLS (restless legs syndrome)    detected on sleep study  . Shortness of breath   . Ventricular fibrillation ([place]) 04/05/2016    Past Surgical History:  Procedure Laterality Date  . ANTERIOR CERVICAL DECOMP/DISCECTOMY FUSION  11/13/2011   Procedure: ANTERIOR CERVICAL DECOMPRESSION/DISCECTOMY FUSION 1 LEVEL;  Surgeon: [name], MD;  Location: [facility];  Service: Neurosurgery;  Laterality: N/A;  Anterior Cervical Decompression/discectomy Fusion. Cervical three-four.  Marland Kitchen CARDIAC CATHETERIZATION N/A 01/01/2015   Procedure: Left Heart Cath and Coronary Angiography;  Surgeon: [name], MD;  Location: [facility];  Service: Cardiovascular;  Laterality: N/A;  . CARDIAC CATHETERIZATION N/A 04/05/2016   Procedure: Left Heart Cath and Coronary Angiography;  Surgeon: Belva Crome, MD;  Location: [facility];  Service: Cardiovascular;  Laterality: N/A;  . CARDIAC CATHETERIZATION N/A 04/05/2016   Procedure: Coronary Stent Intervention;  Surgeon: Belva Crome, MD;  Location: [facility];  Service: Cardiovascular;  Laterality: N/A;  . CARDIAC CATHETERIZATION N/A 04/05/2016   Procedure: Intravascular Ultrasound/IVUS;  Surgeon: Belva Crome, MD;  Location: [facility];  Service: Cardiovascular;  Laterality: N/A;  . NASAL SINUS SURGERY     2005   Family History:  Family History  Problem Relation Age of Onset  .  Prostate cancer Father   . Hypertension Mother   . Kidney Stones Mother   . Anxiety disorder Mother   . Depression Mother   . COPD Sister   . Hypertension Sister   . Diabetes Sister   . Depression Sister   . Anxiety disorder Sister   . Seizures Sister   . ADD / ADHD Son   . ADD / ADHD Daughter    Family Psychiatric  History: Denies  Social History:  Social History   Substance and Sexual Activity  Alcohol Use No  . Alcohol/week: 0.0 standard drinks   Comment: last drinked in 3  months.      Social History   Substance and Sexual Activity  Drug Use No    Social History   Socioeconomic History  . Marital status: Single    Spouse name: Not on file  . Number of children: 3  . Years of education: 20  . Highest education level: Not on file  Occupational History    Comment: unemployed  Social Needs  . Financial resource strain: Not on file  . Food insecurity:    Worry: Not on file    Inability: Not on file  . Transportation needs:    Medical: Not on file    Non-medical: Not on file  Tobacco Use  . Smoking status: Current Every Day Smoker    Packs/day: 2.00    Years: 17.00    Pack years: 34.00    Types: Cigarettes, Cigars  . Smokeless tobacco: Former Systems developer  . Tobacco comment: occassional snuff   Substance and Sexual Activity  . Alcohol use: No    Alcohol/week: 0.0 standard drinks    Comment: last drinked in 3 months.   . Drug use: No  . Sexual activity: Not Currently  Lifestyle  . Physical activity:    Days per week: Not on file    Minutes per session: Not on file  . Stress: Not on file  Relationships  . Social connections:    Talks on phone: Not on file    Gets together: Not on file    Attends religious service: Not on file    Active member of club or organization: Not on file    Attends meetings of clubs or organizations: Not on file    Relationship status: Not on file  Other Topics Concern  . Not on file  Social History Narrative   Patient is right handed.   Patient drinks 2 sodas daily.   Additional Social History:   Lives with mother, sister and patient's children.   Allergies:   Allergies  Allergen Reactions  . Prednisone Other (See Comments)    Hypertension and makes him feel "spacey"  . Hydrocodone Other (See Comments)    Causes headaches and makes the patient irritable  . Trazodone And Nefazodone Other (See Comments)    Patient stated that this keeps him awake; "has an opposite effect on me"  . Varenicline Other (See  Comments)    Suicidal thoughts    Labs:  Results for orders placed or performed during the hospital encounter of 05/22/18 (from the past 48 hour(s))  Valproic acid level     Status: Abnormal   Collection Time: 05/22/18 11:12 AM  Result Value Ref Range   Valproic Acid Lvl 18 (L) 50.0 - 100.0 ug/mL    Comment: Performed at [facility], [address], [place], [place] [address]  Comprehensive metabolic panel     Status: Abnormal  Collection Time: 05/22/18 11:13 AM  Result Value Ref Range   Sodium 133 (L) 135 - 145 mmol/L   Potassium 3.9 3.5 - 5.1 mmol/L   Chloride 102 98 - 111 mmol/L   CO2 19 (L) 22 - 32 mmol/L   Glucose, Bld 232 (H) 70 - 99 mg/dL   BUN 8 6 - 20 mg/dL   Creatinine, Ser 1.03 0.61 - 1.24 mg/dL   Calcium 9.4 8.9 - 10.3 mg/dL   Total Protein 7.5 6.5 - 8.1 g/dL   Albumin 4.6 3.5 - 5.0 g/dL   AST 35 15 - 41 U/L   ALT 28 0 - 44 U/L   Alkaline Phosphatase 115 38 - 126 U/L   Total Bilirubin 0.6 0.3 - 1.2 mg/dL   GFR calc non Af Amer >60 >60 mL/min   GFR calc Af Amer >60 >60 mL/min   Anion gap 12 5 - 15    Comment: Performed at [facility], [address] [place], [place] [address]  Ethanol     Status: None   Collection Time: 05/22/18 11:13 AM  Result Value Ref Range   Alcohol, Ethyl (B) <10 <10 mg/dL    Comment: (NOTE) Lowest detectable limit for serum alcohol is 10 mg/dL. For medical purposes only. Performed at [facility], [address] [place], [place] [address]   Salicylate level     Status: None   Collection Time: 05/22/18 11:13 AM  Result Value Ref Range   Salicylate Lvl <6.2 2.8 - 30.0 mg/dL    Comment: Performed at [facility], [address] [place], [place] [address]  Acetaminophen level     Status: Abnormal   Collection Time: 05/22/18 11:13 AM  Result Value Ref Range   Acetaminophen (Tylenol), Serum <10 (L) 10 - 30 ug/mL    Comment: (NOTE) Therapeutic concentrations vary significantly. A range of  10-30 ug/mL  may be an effective concentration for many patients. However, some  are best treated at concentrations outside of this range. Acetaminophen concentrations >150 ug/mL at 4 hours after ingestion  and >50 ug/mL at 12 hours after ingestion are often associated with  toxic reactions. Performed at [facility], [address] [place], [place] [address]   cbc     Status: Abnormal   Collection Time: 05/22/18 11:13 AM  Result Value Ref Range   WBC 9.6 4.0 - 10.5 K/uL   RBC 4.94 4.22 - 5.81 MIL/uL   Hemoglobin 15.6 13.0 - 17.0 g/dL   HCT 45.6 39.0 - 52.0 %   MCV 92.3 80.0 - 100.0 fL   MCH 31.6 26.0 - 34.0 pg   MCHC 34.2 30.0 - 36.0 g/dL   RDW 15.9 (H) 11.5 - 15.5 %   Platelets 223 150 - 400 K/uL   nRBC 0.0 0.0 - 0.2 %    Comment: Performed at [facility], [address], [place], [place] [address]    Current Facility-Administered Medications  Medication Dose Route Frequency Provider Last Rate Last Dose  . aspirin EC tablet 81 mg  81 mg Oral Daily Lavella Hammock, MD   81 mg at 05/22/18 1222  . atorvastatin (LIPITOR) tablet 80 mg  80 mg Oral Daily Lavella Hammock, MD      . carvedilol (COREG) tablet 6.25 mg  6.25 mg Oral BID WC Lavella Hammock, MD   6.25 mg at 05/23/18 0813  . clonazePAM (KLONOPIN) tablet 0.5 mg  0.5 mg Oral BID Lavella Hammock, MD  0.5 mg at 05/22/18 2117  . cloNIDine (CATAPRES) tablet 0.1 mg  0.1 mg Oral TID Lavella Hammock, MD   0.1 mg at 05/22/18 2117  . diphenhydrAMINE (BENADRYL) capsule 50 mg  50 mg Oral QHS Lavella Hammock, MD   50 mg at 05/22/18 2117  . divalproex (DEPAKOTE) DR tablet 1,000 mg  1,000 mg Oral QHS Lavella Hammock, MD   1,000 mg at 05/22/18 2119  . divalproex (DEPAKOTE) DR tablet 500 mg  500 mg Oral Doroteo Bradford, MD   500 mg at 05/23/18 0800  . fluPHENAZine (PROLIXIN) tablet 15 mg  15 mg Oral QHS Lavella Hammock, MD   15 mg at 05/22/18 2119  . fluPHENAZine (PROLIXIN) tablet 5 mg  5 mg Oral Daily  Lavella Hammock, MD      . fluvoxaMINE (LUVOX) tablet 100 mg  100 mg Oral QHS Lavella Hammock, MD   100 mg at 05/22/18 2120  . fluvoxaMINE (LUVOX) tablet 50 mg  50 mg Oral q morning - 10a Lavella Hammock, MD      . gabapentin (NEURONTIN) capsule 900 mg  900 mg Oral TID Lavella Hammock, MD   900 mg at 05/22/18 2116  . ibuprofen (ADVIL,MOTRIN) tablet 600 mg  600 mg Oral Q6H PRN Lavella Hammock, MD      . lamoTRIgine (LAMICTAL) tablet 100 mg  100 mg Oral Daily Lavella Hammock, MD   100 mg at 05/22/18 1236  . metFORMIN (GLUCOPHAGE) tablet 500 mg  500 mg Oral BID WC Lavella Hammock, MD   500 mg at 05/23/18 0813  . rOPINIRole (REQUIP) tablet 0.25 mg  0.25 mg Oral QHS Lavella Hammock, MD   0.25 mg at 05/22/18 2120   Current Outpatient Medications  Medication Sig Dispense Refill  . albuterol (PROVENTIL HFA;VENTOLIN HFA) 108 (90 Base) MCG/ACT inhaler Inhale 2 puffs into the lungs every 6 (six) hours as needed for wheezing.    . clopidogrel (PLAVIX) 75 MG tablet Take 75 mg by mouth daily.    Marland Kitchen gemfibrozil (LOPID) 600 MG tablet Take 600 mg by mouth 2 (two) times daily.    Marland Kitchen lisinopril (PRINIVIL,ZESTRIL) 5 MG tablet Take 5 mg by mouth daily.    Marland Kitchen aspirin 81 MG EC tablet Take 1 tablet (81 mg total) by mouth daily. For heart health 30 tablet 1  . atorvastatin (LIPITOR) 80 MG tablet Take 1 tablet (80 mg total) by mouth daily at 6 PM. For high cholesterol (Patient taking differently: Take 80 mg by mouth daily. For high cholesterol) 30 tablet 0  . busPIRone (BUSPAR) 15 MG tablet Take 15 mg by mouth daily.    . carvedilol (COREG) 6.25 MG tablet Take 1 tablet (6.25 mg total) by mouth 2 (two) times daily with a meal. For high blood pressure 60 tablet 0  . cloNIDine (CATAPRES) 0.1 MG tablet Take 1 tablet (0.1 mg total) by mouth 3 (three) times daily for 30 days. 90 tablet 0  . diphenhydrAMINE (BENADRYL ALLERGY) 25 MG tablet Take 2 tablets (50 mg total) by mouth at bedtime for 30 days. 60 tablet 0  .  divalproex (DEPAKOTE ER) 500 MG 24 hr tablet Take 2 tablets (1,000 mg total) by mouth at bedtime. 60 tablet 0  . escitalopram (LEXAPRO) 10 MG tablet Take 10 mg by mouth daily.    . fluPHENAZine (PROLIXIN) 5 MG tablet Take 3 tablets (15 mg total) by mouth at bedtime. 90 tablet 0  .  fluPHENAZine (PROLIXIN) 5 MG tablet Take 1 tablet (5 mg total) by mouth daily. In the morning 30 tablet 0  . fluvoxaMINE (LUVOX) 100 MG tablet Take 1 tablet (100 mg total) by mouth at bedtime. 30 tablet 0  . gabapentin (NEURONTIN) 300 MG capsule Take 3 capsules (900 mg total) by mouth 3 (three) times daily. 270 capsule 0  . ibuprofen (ADVIL,MOTRIN) 800 MG tablet Take 2,400 mg by mouth 3 (three) times daily as needed.    . lamoTRIgine (LAMICTAL) 100 MG tablet Take 1 tablet (100 mg total) by mouth daily. 30 tablet 0  . lamoTRIgine (LAMICTAL) 25 MG tablet Take 25 mg by mouth 2 (two) times daily.    . metFORMIN (GLUCOPHAGE) 500 MG tablet Take 1 tablet (500 mg total) by mouth 2 (two) times daily with a meal. For diabetes management    . nitroGLYCERIN (NITROSTAT) 0.4 MG SL tablet Place 1 tablet (0.4 mg total) under the tongue every 5 (five) minutes x 3 doses as needed. For chest pain 10 tablet 0  . rOPINIRole (REQUIP) 0.25 MG tablet Take 1 tablet (0.25 mg total) by mouth at bedtime for 30 days. 30 tablet 1    Musculoskeletal: Strength & Muscle Tone: within normal limits Gait & Station: normal Patient leans: N/A  Psychiatric Specialty Exam: Physical Exam  Nursing note and vitals reviewed. Constitutional: He is oriented to person, place, and time. He appears well-developed and well-nourished.  HENT:  Head: Normocephalic.  Eyes: EOM are normal.  Neck: Normal range of motion.  Cardiovascular: Normal rate.  Respiratory: Effort normal.  Musculoskeletal: Normal range of motion.  Neurological: He is alert and oriented to person, place, and time. He has normal reflexes.  Skin: Skin is warm and dry.    Review of Systems   Constitutional: Negative.   HENT: Negative.   Eyes: Negative.   Respiratory: Negative.   Cardiovascular: Negative.   Gastrointestinal: Negative.   Genitourinary: Negative.   Musculoskeletal: Positive for neck pain.  Skin: Negative.   Neurological: Negative for headaches.  Endo/Heme/Allergies: Negative.   Psychiatric/Behavioral: Positive for depression. Negative for hallucinations, substance abuse and suicidal ideas. The patient is not nervous/anxious and does not have insomnia.   All other systems reviewed and are negative.   Blood pressure 129/86, pulse 64, temperature 98.2 F (36.8 C), temperature source Oral, resp. rate 18, height 6\' 2"  (1.88 m), weight 96 kg, SpO2 94 %.Body mass index is 27.17 kg/m.  General Appearance: Neat  Eye Contact:  Good  Speech:  Clear and Coherent  Volume:  Normal  Mood:  Dysphoric, Hopeless and Irritable  Affect:  Appropriate and Congruent  Thought Process:  Coherent  Orientation:  Full (Time, Place, and Person)  Thought Content:  Logical  Suicidal Thoughts:  Yes.  without intent/plan  Homicidal Thoughts:  No  Memory:  Immediate;   Fair  Judgement:  Fair  Insight:  Fair  Psychomotor Activity:  Normal  Concentration:  Concentration: Fair  Recall:  [employer] of Knowledge:  Fair  Language:  Good  Akathisia:  Negative  Handed:  Right  AIMS (if indicated):   0  Assets:  Social Support  ADL's:  Intact  Cognition:  WNL  Sleep:   improved    Treatment Plan Summary: Medication management  Continue Clonidine 0.1 mg three times a day as needed for agitation and ADHD, can be transitioned to 0.1 mg at bedtime with 0.1 mg use twice daily as needed. Was administered fluphenazine decanoate 25 mg IM today.  He should continue fluphenazine 15 mg at night for mood stabilization, neck movements and sleep until his next injection.  Patient is provided with a fluphenazine decanoate prescription in order for patient to receive his next injection with his  outpatient psychiatrist. Further management deferred to outpatient psychiatrist. Continue Luvox 100 mg at bedtime for depression, anxiety and impulsivity, and  Luvox 50 mg daily in the morning.   This can be increased by 50 mg every 1 to 2 weeks up to a total of 300 mg daily in divided dosing with larger dose given at evening dose. Change to Depakote DR 1000 mg at bedtime for mood stabilization, sleep, impulsivity and nerve pain. Add Depakote DR 500 mg daily in the morning. Continue Benadryl 50 mg at night for sleep and to decrease side effect of antipsychotic Start Klonopin 0.5 mg BID for anxiety.  Continue Requip 0.25 mg at bedtime for restless leg syndrome.  Patient should follow-up with outpatient primary care doctor for continuation of this medication.   Disposition: No evidence of imminent risk to self or others at present.   Patient does not meet criteria for psychiatric inpatient admission. Supportive therapy provided about ongoing stressors. Discussed crisis plan, support from social network, calling 911, coming to the Emergency Department, and calling Suicide Hotline. Medication adjustments made as above.     Patient was able to engage in safety planning including plan to return to nearest emergency room or contact emergency services if he feels unable to maintain her own safety or the safety of others. Patient had no further questions, comments, or concerns.  Discharge into care of sister, who agrees to maintain patient safety.      Lavella Hammock, MD 05/23/2018 9:32 AM

## 2018-05-23 NOTE — ED Provider Notes (Signed)
-----------------------------------------   3:21 PM on 05/23/2018 -----------------------------------------   Blood pressure 116/65, pulse 60, temperature 98.2 F (36.8 C), temperature source Oral, resp. rate 18, height 6\' 2"  (1.88 m), weight 96 kg, SpO2 97 %.  The patient is calm and cooperative at this time.  There have been no acute events since the last update.  Patient was evaluated by group home but in conjunction with his sister he is decided he no longer is interested in going to a group home.  Psychiatry has written some prescriptions to help manage his symptoms at home and a sister will take him back home with her.  Medically and psychiatrically stable, not committable.   Carrie Mew, MD 05/23/18 [phone]

## 2018-05-28 ENCOUNTER — Other Ambulatory Visit: Payer: Self-pay | Admitting: Psychiatry

## 2018-06-03 DIAGNOSIS — I10 Essential (primary) hypertension: Secondary | ICD-10-CM | POA: Diagnosis not present

## 2018-06-03 DIAGNOSIS — M25512 Pain in left shoulder: Secondary | ICD-10-CM | POA: Diagnosis not present

## 2018-06-06 DIAGNOSIS — M50222 Other cervical disc displacement at C5-C6 level: Secondary | ICD-10-CM | POA: Diagnosis not present

## 2018-06-06 DIAGNOSIS — M25512 Pain in left shoulder: Secondary | ICD-10-CM | POA: Diagnosis not present

## 2018-06-06 DIAGNOSIS — M542 Cervicalgia: Secondary | ICD-10-CM | POA: Diagnosis not present

## 2018-06-19 ENCOUNTER — Other Ambulatory Visit: Payer: Self-pay

## 2018-06-19 NOTE — Patient Outreach (Signed)
[facility]) Care Management  06/19/2018  Dub Maclellan 1970/05/15 [id number]   Medication Adherence call to Mr. Anastacio Bua patient did not answer and voice mail is full could not leave any messages. Mr. Bertone is showing past due on Lisinopril 5 mg. [facility] said last pick up was on 04/17/18 for a 30 days supply. Mr. Spreen is showing past due under [place].   [facility] Management Direct Dial [phone]  Fax [phone] [email or web address]

## 2018-07-09 ENCOUNTER — Other Ambulatory Visit: Payer: Self-pay

## 2018-07-09 NOTE — Patient Outreach (Signed)
[facility]) Care Management  07/09/2018  Michael Schmitt [birth year] [id number]   Medication Adherence call to [facility] patient did not answer patient is due on Lisinopril 5 mg under [place].   [facility] Management Direct Dial [phone]  Fax [phone] [name]

## 2018-07-26 ENCOUNTER — Other Ambulatory Visit: Payer: Self-pay

## 2018-07-26 NOTE — Patient Outreach (Signed)
[facility]) Care Management  07/26/2018  Michael Schmitt 08/25/1970 [id number]   Medication Adherence call to [name] patient did not answer patient is due on Metformin 500 mg and Atorvastatin 80 under [place].   [facility] Management Direct Dial [phone]  Fax [phone] [email or web address]

## 2018-07-29 NOTE — Progress Notes (Deleted)
Patient's Name: Michael Schmitt  MRN: [id number]  Referring Provider: Leim Fabry, MD  DOB: 05/11/1970  PCP: [facility]  DOS: 07/31/2018  Note by: Gillis Santa, MD  Service setting: Ambulatory outpatient  Specialty: Interventional Pain Management  Location: [facility] Virtual Visit  Visit type: Initial Patient Evaluation  Patient type: New Patient   Pain Management Virtual Encounter Note - Virtual Visit via  ***  Telehealth (real-time audio visits between healthcare provider and patient).  Patient's Phone No.:  [phone] (home); [phone] (mobile); (Preferred) [phone] No e-mail address on record  [address], [place] [address] Phone: [phone] Fax: [place], [address] [place] [address] Phone: [phone] Fax: [phone]   Pre-screening note:  Our staff contacted Mr. Stowers and offered him an "in person", "face-to-face" appointment versus a telephone encounter. He indicated preferring the telephone encounter, at this time.  Primary Reason(s) for Visit: Tele-Encounter for initial evaluation of one or more chronic problems (new to examiner) potentially causing chronic pain, and posing a threat to normal musculoskeletal function. (Level of risk: High) CC: No chief complaint on file.  I contacted Michael Schmitt on 07/31/2018 at 1:11 PM via  ***  and clearly identified myself as Gillis Santa, MD. I verified that I was speaking with the correct person using two identifiers (Name and date of birth: Apr 22, 1970).  Advanced Informed Consent I sought verbal advanced consent from Darcus Pester for virtual visit interactions. I informed Mr. Carriere of possible security and privacy concerns, risks, and limitations associated with  providing "not-in-person" medical evaluation and management services. I also informed Mr. Gehlhausen of the availability of "in-person" appointments. Finally, I informed him that there would be a charge for the virtual visit and that he could be  personally, fully or partially, financially responsible for it. Mr. Porche expressed understanding and agreed to proceed.   HPI  Mr. Maqueda is a 48 y.o. year old, male patient, contacted today for an initial evaluation of his chronic pain. He has Cervical spondylosis without myelopathy; Tobacco use disorder; Ventricular fibrillation ([place]); Sedative, hypnotic or anxiolytic use disorder, severe, dependence ([place]); HTN (hypertension); GERD (gastroesophageal reflux disease); Alcohol use disorder, severe, 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[place]); Asthma; Combined hyperlipidemia; Coronary artery disease; Chronic Suicidal ideation; Benzodiazepine abuse ([place]); Severe episode of recurrent major depressive disorder, without psychotic features ([place]); Uncontrolled type 2 diabetes mellitus without complication, without long-term current use of insulin ([place]); Chronic pain syndrome; Long term prescription benzodiazepine use; Opiate use; Chronic neck pain (Primary Area of Pain) (Left); Cervical facet syndrome (Left); Cervical radiculitis (Left); History of cervical spinal surgery; Cervical spondylosis; Chronic shoulder pain (Secondary Area of Pain) (Left); Arthropathy of shoulder (Left); Chronic low back pain [facility] Area of Pain) (Bilateral) (L>R); Lumbar facet syndrome (Bilateral) (L>R); Generalized anxiety disorder; Severe recurrent major depression without psychotic features ([place]); Dysthymia; MDD (major depressive disorder), recurrent episode, severe ([place]); Personality disorder ([place]); Depression, major, recurrent, severe with psychosis ([place]); and Overdose on their problem list.  Pain Assessment: Location:     Radiating:   Onset:   Duration:   Quality:   Severity:  /10 (subjective,  self-reported pain score)  Effect on ADL:   Timing:   Modifying factors:    Onset  and Duration: {Hx; Onset and Duration:210120511} Cause of pain: {Hx; Cause:210120521} Severity: {Pain Severity:210120502} Timing: {Symptoms; Timing:210120501} Aggravating Factors: {Causes; Aggravating pain factors:210120507} Alleviating Factors: {Causes; Alleviating Factors:210120500} Associated Problems: {Hx; Associated problems:210120515} Quality of Pain: {Hx; Symptom quality or Descriptor:210120531} Previous Examinations or Tests: {Hx; Previous examinations or test:210120529} Previous Treatments: {Hx; Previous Treatment:210120503}  ***  The patient was informed that my practice is divided into two sections: an interventional pain management section, as well as a completely separate and distinct medication management section. I explained that I have procedure days for my interventional therapies, and evaluation days for follow-ups and medication management. Because of the amount of documentation required during both, they are kept separated. This means that there is the possibility that he may be scheduled for a procedure on one day, and medication management the next. I have also informed him that because of staffing and facility limitations, I no longer take patients for medication management only. To illustrate the reasons for this, I gave the patient the example of surgeons, and how inappropriate it would be to refer a patient to his/her care, just to write for the post-surgical antibiotics on a surgery done by a different surgeon.   Because interventional pain management is my board-certified specialty, the patient was informed that joining my practice means that they are open to any and all interventional therapies. I made it clear that this does not mean that they will be forced to have any procedures done. What this means is that I believe interventional therapies to be essential part of the diagnosis and  proper management of chronic pain conditions. Therefore, patients not interested in these interventional alternatives will be better served under the care of a different practitioner.  The patient was also made aware of my Comprehensive Pain Management Safety Guidelines where by joining my practice, they limit all of their nerve blocks and joint injections to those done by our practice, for as long as we are retained to manage their care.   Historic Controlled Substance Pharmacotherapy Review  PMP and historical list of controlled substances: ***  Highest opioid analgesic regimen found: ***  Most recent opioid analgesic: ***  Current opioid analgesics: ***  Highest recorded MME/day: *** mg/day MME/day: *** mg/day Medications: Bottles not available for inspection. Pharmacodynamics: Desired effects: Analgesia: The patient reports >50% benefit. Reported improvement in function: The patient reports medication allows him to accomplish basic ADLs. Clinically meaningful improvement in function (CMIF): Sustained CMIF goals met Perceived effectiveness: Described as relatively effective, allowing for increase in activities of daily living (ADL) Undesirable effects: Side-effects or Adverse reactions: None reported Historical Monitoring: The patient  reports no history of drug use. List of all UDS Test(s): Lab Results  Component Value Date   MDMA NONE DETECTED 05/07/2018   MDMA NONE DETECTED 04/17/2018   MDMA NONE DETECTED 03/30/2018   MDMA NONE DETECTED 03/07/2018   MDMA NONE DETECTED 12/30/2017   MDMA NONE DETECTED 12/29/2017   MDMA NONE DETECTED 11/16/2017   MDMA NONE DETECTED 10/26/2017   MDMA NONE DETECTED 06/22/2017   MDMA NONE DETECTED 04/23/2017   MDMA NONE DETECTED 04/22/2017   MDMA NONE DETECTED 03/12/2017   MDMA NONE DETECTED 03/10/2017   MDMA NONE DETECTED 02/04/2017   MDMA NONE DETECTED 09/16/2016   MDMA NONE DETECTED 09/11/2016   MDMA NONE DETECTED 07/04/2016   MDMA NONE  DETECTED 06/23/2016   MDMA NONE DETECTED 06/10/2016   MDMA NONE DETECTED 06/03/2016   MDMA NONE DETECTED 05/24/2016   MDMA NONE DETECTED  05/14/2016   MDMA NONE DETECTED 05/12/2016   MDMA NONE DETECTED 05/06/2016   MDMA NONE DETECTED 03/26/2016   MDMA NONE DETECTED 09/23/2015   MDMA NONE DETECTED 09/05/2015   MDMA NONE DETECTED 07/31/2015   MDMA NEGATIVE 08/14/2012   MDMA NEGATIVE 03/25/2012   COCAINSCRNUR NONE DETECTED 05/07/2018   COCAINSCRNUR NONE DETECTED 04/17/2018   COCAINSCRNUR NONE DETECTED 03/30/2018   COCAINSCRNUR NONE DETECTED 03/07/2018   COCAINSCRNUR NONE DETECTED 01/04/2018   COCAINSCRNUR NONE DETECTED 12/30/2017   COCAINSCRNUR NONE DETECTED 12/29/2017   COCAINSCRNUR NONE DETECTED 11/19/2017   COCAINSCRNUR NONE DETECTED 11/16/2017   COCAINSCRNUR NONE DETECTED 10/26/2017   COCAINSCRNUR NONE DETECTED 07/08/2017   COCAINSCRNUR NONE DETECTED 06/22/2017   COCAINSCRNUR NONE DETECTED 04/23/2017   COCAINSCRNUR NONE DETECTED 04/22/2017   COCAINSCRNUR NONE DETECTED 03/12/2017   COCAINSCRNUR NONE DETECTED 03/10/2017   COCAINSCRNUR NONE DETECTED 02/04/2017   COCAINSCRNUR NONE DETECTED 09/16/2016   COCAINSCRNUR NONE DETECTED 09/11/2016   COCAINSCRNUR NONE DETECTED 07/04/2016   COCAINSCRNUR NONE DETECTED 06/23/2016   COCAINSCRNUR NONE DETECTED 06/10/2016   COCAINSCRNUR NONE DETECTED 06/03/2016   COCAINSCRNUR NONE DETECTED 05/24/2016   COCAINSCRNUR NONE DETECTED 05/14/2016   COCAINSCRNUR NONE DETECTED 05/12/2016   COCAINSCRNUR NONE DETECTED 05/06/2016   COCAINSCRNUR NONE DETECTED 03/26/2016   COCAINSCRNUR NONE DETECTED 09/23/2015   COCAINSCRNUR NONE DETECTED 09/05/2015   COCAINSCRNUR NONE DETECTED 07/31/2015   COCAINSCRNUR NEGATIVE 08/14/2012   COCAINSCRNUR NEGATIVE 03/25/2012   COCAINSCRNUR NONE DETECTED 03/22/2008   COCAINSCRNUR NONE DETECTED 08/15/2007   PCPSCRNUR NONE DETECTED 05/07/2018   PCPSCRNUR NONE DETECTED 04/17/2018   PCPSCRNUR NONE DETECTED 03/30/2018    PCPSCRNUR NONE DETECTED 03/07/2018   PCPSCRNUR NONE DETECTED 12/30/2017   PCPSCRNUR NONE DETECTED 12/29/2017   PCPSCRNUR NONE DETECTED 11/16/2017   PCPSCRNUR NONE DETECTED 10/26/2017   PCPSCRNUR NONE DETECTED 06/22/2017   PCPSCRNUR NONE DETECTED 04/23/2017   PCPSCRNUR NONE DETECTED 04/22/2017   PCPSCRNUR NONE DETECTED 03/12/2017   PCPSCRNUR NONE DETECTED 03/10/2017   PCPSCRNUR NONE DETECTED 02/04/2017   PCPSCRNUR NONE DETECTED 09/16/2016   PCPSCRNUR NONE DETECTED 09/11/2016   PCPSCRNUR NONE DETECTED 07/04/2016   PCPSCRNUR NONE DETECTED 06/23/2016   PCPSCRNUR NONE DETECTED 06/10/2016   PCPSCRNUR NONE DETECTED 06/03/2016   PCPSCRNUR POSITIVE (A) 05/24/2016   PCPSCRNUR NONE DETECTED 05/14/2016   PCPSCRNUR NONE DETECTED 05/12/2016   PCPSCRNUR NONE DETECTED 05/06/2016   PCPSCRNUR NONE DETECTED 03/26/2016   PCPSCRNUR NONE DETECTED 09/23/2015   PCPSCRNUR NONE DETECTED 09/05/2015   PCPSCRNUR NONE DETECTED 07/31/2015   PCPSCRNUR NEGATIVE 08/14/2012   PCPSCRNUR NEGATIVE 03/25/2012   THCU NONE DETECTED 05/07/2018   THCU NONE DETECTED 04/17/2018   THCU NONE DETECTED 03/30/2018   THCU NONE DETECTED 03/07/2018   THCU NONE DETECTED 01/04/2018   THCU NONE DETECTED 12/30/2017   THCU NONE DETECTED 12/29/2017   THCU NONE DETECTED 11/19/2017   THCU NONE DETECTED 11/16/2017   THCU NONE DETECTED 10/26/2017   THCU NONE DETECTED 07/08/2017   THCU NONE DETECTED 06/22/2017   THCU NONE DETECTED 04/23/2017   THCU NONE DETECTED 04/22/2017   THCU NONE DETECTED 03/12/2017   THCU NONE DETECTED 03/10/2017   THCU NONE DETECTED 02/04/2017   THCU NONE DETECTED 09/16/2016   THCU NONE DETECTED 09/11/2016   THCU NONE DETECTED 07/04/2016   THCU NONE DETECTED 06/23/2016   THCU POSITIVE (A) 06/10/2016   THCU NONE DETECTED 06/03/2016   THCU NONE DETECTED 05/24/2016   THCU NONE DETECTED 05/14/2016   THCU NONE DETECTED 05/12/2016   THCU RESULTS UNAVAILABLE DUE TO INTERFERING SUBSTANCE (A)  05/06/2016   THCU  NONE DETECTED 03/26/2016   THCU NONE DETECTED 09/23/2015   THCU NONE DETECTED 09/05/2015   THCU NONE DETECTED 07/31/2015   THCU NEGATIVE 08/14/2012   THCU NEGATIVE 03/25/2012   THCU NONE DETECTED 03/22/2008   THCU NONE DETECTED 08/15/2007   ETH <10 05/22/2018   ETH <10 05/17/2018   ETH <10 05/07/2018   ETH <10 04/17/2018   ETH <10 03/30/2018   ETH <10 03/07/2018   ETH <10 01/04/2018   ETH <10 12/30/2017   ETH <10 12/29/2017   ETH <10 11/19/2017   ETH <10 11/16/2017   ETH <10 10/26/2017   ETH <10 07/08/2017   ETH <10 06/22/2017   ETH <10 04/23/2017   ETH <10 04/22/2017   ETH 37 (H) 03/12/2017   ETH <10 02/04/2017   ETH <10 01/03/2017   ETH <5 09/16/2016   ETH <5 09/11/2016   ETH <5 08/03/2016   ETH <5 07/04/2016   ETH <5 06/24/2016   ETH <5 06/23/2016   ETH <5 06/10/2016   ETH <5 06/03/2016   ETH <5 05/24/2016   ETH <5 05/14/2016   ETH <5 05/12/2016   ETH <5 05/06/2016   ETH <5 03/26/2016   ETH <5 09/23/2015   ETH <5 09/05/2015   ETH <5 07/31/2015   ETH  03/22/2008    <5        LOWEST DETECTABLE LIMIT FOR SERUM ALCOHOL IS 5 mg/dL FOR MEDICAL PURPOSES ONLY   ETH  08/15/2007    <5        LOWEST DETECTABLE LIMIT FOR SERUM ALCOHOL IS 11 mg/dL FOR MEDICAL PURPOSES ONLY   List of other Serum/Urine Drug Screening Test(s):  Lab Results  Component Value Date   COCAINSCRNUR NONE DETECTED 05/07/2018   COCAINSCRNUR NONE DETECTED 04/17/2018   COCAINSCRNUR NONE DETECTED 03/30/2018   COCAINSCRNUR NONE DETECTED 03/07/2018   COCAINSCRNUR NONE DETECTED 01/04/2018   COCAINSCRNUR NONE DETECTED 12/30/2017   COCAINSCRNUR NONE DETECTED 12/29/2017   COCAINSCRNUR NONE DETECTED 11/19/2017   COCAINSCRNUR NONE DETECTED 11/16/2017   COCAINSCRNUR NONE DETECTED 10/26/2017   COCAINSCRNUR NONE DETECTED 07/08/2017   COCAINSCRNUR NONE DETECTED 06/22/2017   COCAINSCRNUR NONE DETECTED 04/23/2017   COCAINSCRNUR NONE DETECTED 04/22/2017   COCAINSCRNUR NONE DETECTED 03/12/2017    COCAINSCRNUR NONE DETECTED 03/10/2017   COCAINSCRNUR NONE DETECTED 02/04/2017   COCAINSCRNUR NONE DETECTED 09/16/2016   COCAINSCRNUR NONE DETECTED 09/11/2016   COCAINSCRNUR NONE DETECTED 07/04/2016   COCAINSCRNUR NONE DETECTED 06/23/2016   COCAINSCRNUR NONE DETECTED 06/10/2016   COCAINSCRNUR NONE DETECTED 06/03/2016   COCAINSCRNUR NONE DETECTED 05/24/2016   COCAINSCRNUR NONE DETECTED 05/14/2016   COCAINSCRNUR NONE DETECTED 05/12/2016   COCAINSCRNUR NONE DETECTED 05/06/2016   COCAINSCRNUR NONE DETECTED 03/26/2016   COCAINSCRNUR NONE DETECTED 09/23/2015   COCAINSCRNUR NONE DETECTED 09/05/2015   COCAINSCRNUR NONE DETECTED 07/31/2015   COCAINSCRNUR NEGATIVE 08/14/2012   COCAINSCRNUR NEGATIVE 03/25/2012   COCAINSCRNUR NONE DETECTED 03/22/2008   COCAINSCRNUR NONE DETECTED 08/15/2007   THCU NONE DETECTED 05/07/2018   THCU NONE DETECTED 04/17/2018   THCU NONE DETECTED 03/30/2018   THCU NONE DETECTED 03/07/2018   THCU NONE DETECTED 01/04/2018   THCU NONE DETECTED 12/30/2017   THCU NONE DETECTED 12/29/2017   THCU NONE DETECTED 11/19/2017   THCU NONE DETECTED 11/16/2017   THCU NONE DETECTED 10/26/2017   THCU NONE DETECTED 07/08/2017   THCU NONE DETECTED 06/22/2017   THCU NONE DETECTED 04/23/2017   THCU NONE DETECTED 04/22/2017   THCU NONE DETECTED 03/12/2017   THCU  NONE DETECTED 03/10/2017   THCU NONE DETECTED 02/04/2017   THCU NONE DETECTED 09/16/2016   THCU NONE DETECTED 09/11/2016   THCU NONE DETECTED 07/04/2016   THCU NONE DETECTED 06/23/2016   THCU POSITIVE (A) 06/10/2016   THCU NONE DETECTED 06/03/2016   THCU NONE DETECTED 05/24/2016   THCU NONE DETECTED 05/14/2016   THCU NONE DETECTED 05/12/2016   THCU RESULTS UNAVAILABLE DUE TO INTERFERING SUBSTANCE (A) 05/06/2016   THCU NONE DETECTED 03/26/2016   THCU NONE DETECTED 09/23/2015   THCU NONE DETECTED 09/05/2015   THCU NONE DETECTED 07/31/2015   THCU NEGATIVE 08/14/2012   THCU NEGATIVE 03/25/2012   THCU NONE DETECTED  03/22/2008   THCU NONE DETECTED 08/15/2007   ETH <10 05/22/2018   ETH <10 05/17/2018   ETH <10 05/07/2018   ETH <10 04/17/2018   ETH <10 03/30/2018   ETH <10 03/07/2018   ETH <10 01/04/2018   ETH <10 12/30/2017   ETH <10 12/29/2017   ETH <10 11/19/2017   ETH <10 11/16/2017   ETH <10 10/26/2017   ETH <10 07/08/2017   ETH <10 06/22/2017   ETH <10 04/23/2017   ETH <10 04/22/2017   ETH 37 (H) 03/12/2017   ETH <10 02/04/2017   ETH <10 01/03/2017   ETH <5 09/16/2016   ETH <5 09/11/2016   ETH <5 08/03/2016   ETH <5 07/04/2016   ETH <5 06/24/2016   ETH <5 06/23/2016   ETH <5 06/10/2016   ETH <5 06/03/2016   ETH <5 05/24/2016   ETH <5 05/14/2016   ETH <5 05/12/2016   ETH <5 05/06/2016   ETH <5 03/26/2016   ETH <5 09/23/2015   ETH <5 09/05/2015   ETH <5 07/31/2015   ETH  03/22/2008    <5        LOWEST DETECTABLE LIMIT FOR SERUM ALCOHOL IS 5 mg/dL FOR MEDICAL PURPOSES ONLY   ETH  08/15/2007    <5        LOWEST DETECTABLE LIMIT FOR SERUM ALCOHOL IS 11 mg/dL FOR MEDICAL PURPOSES ONLY   Historical Background Evaluation: [place] PMP: PDMP not reviewed this encounter. Six (6) year initial data search conducted.             PMP NARX Score Report:  Narcotic: *** Sedative: *** Stimulant: *** [place] Department of public safety, offender search: Editor, commissioning Information) Non-contributory Risk Assessment Profile: Aberrant behavior: None observed or detected today Risk factors for fatal opioid overdose: None identified today PMP NARX Overdose Risk Score: *** Fatal overdose hazard ratio (HR): Calculation deferred Non-fatal overdose hazard ratio (HR): Calculation deferred Risk of opioid abuse or dependence: 0.7-3.0% with doses ? 36 MME/day and 6.1-26% with doses ? 120 MME/day. Substance use disorder (SUD) risk level: See below Personal History of Substance Abuse (SUD-Substance use disorder):  Alcohol:    Illegal Drugs:    Rx Drugs:    ORT Risk Level calculation:    ORT Scoring  interpretation table:  Score <3 = Low Risk for SUD  Score between 4-7 = Moderate Risk for SUD  Score >8 = High Risk for Opioid Abuse   Pharmacologic Plan: As per protocol, I have not taken over any controlled substance management, pending the results of ordered tests and/or consults.            Initial impression: Pending review of available data and ordered tests.  Meds   Current Outpatient Medications:  .  albuterol (PROVENTIL HFA;VENTOLIN HFA) 108 (90 Base) MCG/ACT inhaler, Inhale 2  puffs into the lungs every 6 (six) hours as needed for wheezing., Disp: , Rfl:  .  aspirin 81 MG EC tablet, Take 1 tablet (81 mg total) by mouth daily. For heart health, Disp: 30 tablet, Rfl: 1 .  atorvastatin (LIPITOR) 80 MG tablet, Take 1 tablet (80 mg total) by mouth daily at 6 PM. For high cholesterol (Patient taking differently: Take 80 mg by mouth daily. For high cholesterol), Disp: 30 tablet, Rfl: 0 .  carvedilol (COREG) 6.25 MG tablet, Take 1 tablet (6.25 mg total) by mouth 2 (two) times daily with a meal. For high blood pressure, Disp: 60 tablet, Rfl: 0 .  clonazePAM (KLONOPIN) 0.5 MG tablet, Take 1 tablet (0.5 mg total) by mouth 2 (two) times daily., Disp: 60 tablet, Rfl: 0 .  cloNIDine (CATAPRES) 0.1 MG tablet, Take 1 tablet (0.1 mg total) by mouth 3 (three) times daily for 30 days., Disp: 90 tablet, Rfl: 0 .  clopidogrel (PLAVIX) 75 MG tablet, Take 75 mg by mouth daily., Disp: , Rfl:  .  diphenhydrAMINE (BENADRYL ALLERGY) 25 MG tablet, Take 2 tablets (50 mg total) by mouth at bedtime for 30 days., Disp: 60 tablet, Rfl: 0 .  divalproex (DEPAKOTE) 500 MG DR tablet, Take 500 mg (1 tablet) in the morning and 1000 mg (2 tablets) at night, Disp: 90 tablet, Rfl: 0 .  fluPHENAZine (PROLIXIN) 5 MG tablet, Take 3 tablets (15 mg total) by mouth at bedtime., Disp: 90 tablet, Rfl: 0 .  fluPHENAZine decanoate (PROLIXIN) 25 MG/ML injection, Inject 1 mL (25 mg total) into the muscle every 21 ( twenty-one) days. To be  given at doctor's office, Disp: 5 mL, Rfl: 0 .  fluvoxaMINE (LUVOX) 100 MG tablet, Take 1 tablet (100 mg total) by mouth at bedtime., Disp: 30 tablet, Rfl: 0 .  fluvoxaMINE (LUVOX) 50 MG tablet, Take 1 tablet (50 mg total) by mouth daily with breakfast., Disp: 30 tablet, Rfl: 0 .  gabapentin (NEURONTIN) 300 MG capsule, Take 3 capsules (900 mg total) by mouth 3 (three) times daily., Disp: 270 capsule, Rfl: 0 .  gemfibrozil (LOPID) 600 MG tablet, Take 600 mg by mouth 2 (two) times daily., Disp: , Rfl:  .  ibuprofen (ADVIL,MOTRIN) 800 MG tablet, Take 2,400 mg by mouth 3 (three) times daily as needed., Disp: , Rfl:  .  lamoTRIgine (LAMICTAL) 100 MG tablet, Take 1 tablet (100 mg total) by mouth daily., Disp: 30 tablet, Rfl: 0 .  lamoTRIgine (LAMICTAL) 25 MG tablet, Take 25 mg by mouth 2 (two) times daily., Disp: , Rfl:  .  lisinopril (PRINIVIL,ZESTRIL) 5 MG tablet, Take 5 mg by mouth daily., Disp: , Rfl:  .  metFORMIN (GLUCOPHAGE) 500 MG tablet, Take 1 tablet (500 mg total) by mouth 2 (two) times daily with a meal. For diabetes management, Disp: , Rfl:  .  nitroGLYCERIN (NITROSTAT) 0.4 MG SL tablet, Place 1 tablet (0.4 mg total) under the tongue every 5 (five) minutes x 3 doses as needed. For chest pain, Disp: 10 tablet, Rfl: 0 .  rOPINIRole (REQUIP) 0.25 MG tablet, Take 1 tablet (0.25 mg total) by mouth at bedtime for 30 days., Disp: 30 tablet, Rfl: 1  ROS  Cardiovascular: {Hx; Cardiovascular History:210120525} Pulmonary or Respiratory: {Hx; Pumonary and/or Respiratory History:210120523} Neurological: {Hx; Neurological:210120504} Review of Past Neurological Studies:  Results for orders placed or performed during the hospital encounter of 03/07/18  CT Head Wo Contrast   Narrative   CLINICAL DATA:  48 year old male with weakness, syncope, abnormal  gait, fall with left eye bruising. On Plavix.  EXAM: CT HEAD WITHOUT CONTRAST  CT MAXILLOFACIAL WITHOUT CONTRAST  CT CERVICAL SPINE WITHOUT  CONTRAST  TECHNIQUE: Multidetector CT imaging of the head, cervical spine, and maxillofacial structures were performed using the standard protocol without intravenous contrast. Multiplanar CT image reconstructions of the cervical spine and maxillofacial structures were also generated.  COMPARISON:  Head CTs 04/22/2017 and earlier. Cervical spine MRI 09/29/2011.  FINDINGS: CT HEAD FINDINGS  Brain: No midline shift, ventriculomegaly, mass effect, evidence of mass lesion, intracranial hemorrhage or evidence of cortically based acute infarction. Gray-white matter differentiation is within normal limits throughout the brain.  Vascular: Calcified atherosclerosis at the skull base. No suspicious intracranial vascular hyperdensity.  Skull: Stable and intact.  Other: Left forehead broad-based scalp hematoma further described in the face section below. No scalp soft tissue gas. Right side and posterior scalp soft tissues are negative.  CT MAXILLOFACIAL FINDINGS  Osseous: Mandible intact. Absent maxillary dentition. Stable appearing mild nasal bone deformities. No maxilla or acute nasal fracture. Intact zygoma and central skull base.  Orbits: Intact orbital walls. Globes are intact. Bilateral intraorbital soft tissues appear symmetric and normal. Superficial left periorbital and left forehead broad-based scalp hematoma measuring up to 9 millimeters in thickness. No soft tissue gas.  Sinuses: Chronic frontal sinus and frontoethmoidal recess mucosal thickening. Previous maxillary antrostomies and ethmoidectomies are well pneumatized. Sphenoid sinuses are clear. Tympanic cavities and mastoids are well pneumatized.  Soft tissues: Negative visible noncontrast larynx, pharynx, parapharyngeal spaces, retropharyngeal space, sublingual space, submandibular spaces, parotid spaces, masticator spaces. Bilateral upper cervical lymph nodes appear symmetric and within normal limits.  CT  CERVICAL SPINE FINDINGS  Alignment: Chronic straightening of cervical lordosis, mildly increased compared to 2013. Cervicothoracic junction alignment is within normal limits. Bilateral posterior element alignment is within normal limits.  Skull base and vertebrae: Visualized skull base is intact. No atlanto-occipital dissociation. No cervical spine fracture.  Soft tissues and spinal canal: No prevertebral fluid or swelling. No visible canal hematoma. Negative noncontrast neck soft tissues.  Disc levels: Chronic left-side facet arthropathy and ankylosis at C3-C4 where ACDF has been performed since the prior MRI and there is solid arthrodesis. No evidence of hardware loosening.  Severe facet arthropathy on the left now at C4-C5 and C5-C6 with vacuum facet and scattered gas containing synovial cysts suspected (sagittal image 38). Superimposed C5-C6 disc and endplate degeneration has progressed since 2013. No definite spinal stenosis.  Upper chest: Visible upper thoracic levels appear intact. Negative lung apices and noncontrast thoracic inlet.  IMPRESSION: 1. Left forehead and periorbital scalp hematoma without underlying fracture or intraorbital injury. 2. Stable and normal noncontrast CT appearance of the brain. 3. No acute osseous abnormality in the cervical spine. 4. C3-C4 ACDF with solid arthrodesis. Chronic left side cervical facet arthropathy has progressed since 2013, now severe at C4-C5 and C5-C6.   Electronically Signed   By: Genevie Ann M.D.   On: 03/07/2018 09:31    Psychological-Psychiatric: {Hx; Psychological-Psychiatric History:210120512} Gastrointestinal: {Hx; Gastrointestinal:210120527} Genitourinary: {Hx; Genitourinary:210120506} Hematological: {Hx; Hematological:210120510} Endocrine: {Hx; Endocrine history:210120509} Rheumatologic: {Hx; Rheumatological:210120530} Musculoskeletal: {Hx; Musculoskeletal:210120528} Work History: {Hx; Work  history:210120514}  Allergies  Mr. Minix is allergic to prednisone; hydrocodone; trazodone and nefazodone; and varenicline.  Laboratory Chemistry  Inflammation Markers (CRP: Acute Phase) (ESR: Chronic Phase) Lab Results  Component Value Date   CRP 2.6 11/22/2016   ESRSEDRATE 2 11/22/2016  Rheumatology Markers Lab Results  Component Value Date   LABURIC 6.5 07/22/2015                        Renal Function Markers Lab Results  Component Value Date   BUN 8 05/22/2018   CREATININE 1.03 05/22/2018   BCR 11 11/22/2016   GFRAA >60 05/22/2018   GFRNONAA >60 05/22/2018                             Hepatic Function Markers Lab Results  Component Value Date   AST 35 05/22/2018   ALT 28 05/22/2018   ALBUMIN 4.6 05/22/2018   ALKPHOS 115 05/22/2018   LIPASE 28 03/10/2017   AMMONIA 31 03/07/2018                        Electrolytes Lab Results  Component Value Date   NA 133 (L) 05/22/2018   K 3.9 05/22/2018   CL 102 05/22/2018   CALCIUM 9.4 05/22/2018   MG 1.8 07/08/2017                        Neuropathy Markers Lab Results  Component Value Date   [id number] 496 11/22/2016   HGBA1C 7.4 (H) 05/18/2018                        CNS Tests No results found for: COLORCSF, APPEARCSF, RBCCOUNTCSF, WBCCSF, POLYSCSF, LYMPHSCSF, EOSCSF, PROTEINCSF, GLUCCSF, JCVIRUS, CSFOLI, IGGCSF, LABACHR, ACETBL                      Bone Pathology Markers Lab Results  Component Value Date   25OHVITD1 29 (L) 11/22/2016   25OHVITD2 <1.0 11/22/2016   25OHVITD3 28 11/22/2016                         Coagulation Parameters Lab Results  Component Value Date   INR 1.00 03/07/2018   LABPROT 13.1 03/07/2018   APTT 26 04/05/2016   PLT 223 05/22/2018                        Cardiovascular Markers Lab Results  Component Value Date   CKTOTAL 65 03/07/2018   TROPONINI <0.03 03/09/2018   HGB 15.6 05/22/2018   HCT 45.6 05/22/2018                         ID Markers No  results found for: LYMEIGGIGMAB, HIV                      CA Markers No results found for: CEA, CA125, LABCA2                      Endocrine Markers Lab Results  Component Value Date   TSH 0.641 05/18/2018   FREET4 0.74 01/02/2015                        Note: Lab results reviewed.  Imaging Review  Cervical Imaging: Cervical MR wo contrast: No results found for this or any previous visit. Cervical MR wo contrast: No procedure found. Cervical MR w/wo contrast: No results found for this or any previous visit. Cervical MR w contrast: No  results found for this or any previous visit. Cervical CT wo contrast:  Results for orders placed during the hospital encounter of 03/07/18  CT Cervical Spine Wo Contrast   Narrative CLINICAL DATA:  48 year old male with weakness, syncope, abnormal gait, fall with left eye bruising. On Plavix.  EXAM: CT HEAD WITHOUT CONTRAST  CT MAXILLOFACIAL WITHOUT CONTRAST  CT CERVICAL SPINE WITHOUT CONTRAST  TECHNIQUE: Multidetector CT imaging of the head, cervical spine, and maxillofacial structures were performed using the standard protocol without intravenous contrast. Multiplanar CT image reconstructions of the cervical spine and maxillofacial structures were also generated.  COMPARISON:  Head CTs 04/22/2017 and earlier. Cervical spine MRI 09/29/2011.  FINDINGS: CT HEAD FINDINGS  Brain: No midline shift, ventriculomegaly, mass effect, evidence of mass lesion, intracranial hemorrhage or evidence of cortically based acute infarction. Gray-white matter differentiation is within normal limits throughout the brain.  Vascular: Calcified atherosclerosis at the skull base. No suspicious intracranial vascular hyperdensity.  Skull: Stable and intact.  Other: Left forehead broad-based scalp hematoma further described in the face section below. No scalp soft tissue gas. Right side and posterior scalp soft tissues are negative.  CT MAXILLOFACIAL  FINDINGS  Osseous: Mandible intact. Absent maxillary dentition. Stable appearing mild nasal bone deformities. No maxilla or acute nasal fracture. Intact zygoma and central skull base.  Orbits: Intact orbital walls. Globes are intact. Bilateral intraorbital soft tissues appear symmetric and normal. Superficial left periorbital and left forehead broad-based scalp hematoma measuring up to 9 millimeters in thickness. No soft tissue gas.  Sinuses: Chronic frontal sinus and frontoethmoidal recess mucosal thickening. Previous maxillary antrostomies and ethmoidectomies are well pneumatized. Sphenoid sinuses are clear. Tympanic cavities and mastoids are well pneumatized.  Soft tissues: Negative visible noncontrast larynx, pharynx, parapharyngeal spaces, retropharyngeal space, sublingual space, submandibular spaces, parotid spaces, masticator spaces. Bilateral upper cervical lymph nodes appear symmetric and within normal limits.  CT CERVICAL SPINE FINDINGS  Alignment: Chronic straightening of cervical lordosis, mildly increased compared to 2013. Cervicothoracic junction alignment is within normal limits. Bilateral posterior element alignment is within normal limits.  Skull base and vertebrae: Visualized skull base is intact. No atlanto-occipital dissociation. No cervical spine fracture.  Soft tissues and spinal canal: No prevertebral fluid or swelling. No visible canal hematoma. Negative noncontrast neck soft tissues.  Disc levels: Chronic left-side facet arthropathy and ankylosis at C3-C4 where ACDF has been performed since the prior MRI and there is solid arthrodesis. No evidence of hardware loosening.  Severe facet arthropathy on the left now at C4-C5 and C5-C6 with vacuum facet and scattered gas containing synovial cysts suspected (sagittal image 38). Superimposed C5-C6 disc and endplate degeneration has progressed since 2013. No definite spinal stenosis.  Upper chest: Visible  upper thoracic levels appear intact. Negative lung apices and noncontrast thoracic inlet.  IMPRESSION: 1. Left forehead and periorbital scalp hematoma without underlying fracture or intraorbital injury. 2. Stable and normal noncontrast CT appearance of the brain. 3. No acute osseous abnormality in the cervical spine. 4. C3-C4 ACDF with solid arthrodesis. Chronic left side cervical facet arthropathy has progressed since 2013, now severe at C4-C5 and C5-C6.   Electronically Signed   By: Genevie Ann M.D.   On: 03/07/2018 09:31    Cervical CT w/wo contrast: No results found for this or any previous visit. Cervical CT w/wo contrast: No results found for this or any previous visit. Cervical CT w contrast: No results found for this or any previous visit. Cervical CT outside: No results found for this or  any previous visit. Cervical DG 1 view:  Results for orders placed during the hospital encounter of 11/13/11  DG Cervical Spine 1 View   Narrative *RADIOLOGY REPORT*  Clinical Data: Neck pain  DG C-ARM 1-60 MIN,DG CERVICAL SPINE - 1 VIEW  Comparison: None.  Findings: C-arm films document C3-4 ACDF.  No adverse features.  IMPRESSION: As above.   Original Report Authenticated By: Staci Righter, M.D.    Cervical DG 2-3 views: No results found for this or any previous visit. Cervical DG F/E views: No results found for this or any previous visit. Cervical DG 2-3 clearing views: No results found for this or any previous visit. Cervical DG Bending/F/E views: No results found for this or any previous visit. Cervical DG complete:  Results for orders placed during the hospital encounter of 08/18/14  DG Cervical Spine Complete   Narrative CLINICAL DATA:  Chronic neck pain since cervical fusion in 2013.  EXAM: CERVICAL SPINE  4+ VIEWS  COMPARISON:  MRI cervical spine 09/29/2011  FINDINGS: Anterior and interbody fusion changes noted at C3-4. No complicating features are  demonstrated. Mild degenerative spurring changes noted at C2-3. Normal alignment of the cervical vertebral bodies. No acute bony findings. The nerve foramen are grossly patent. The left oblique films are slightly over rotated. The C1-2 articulations are maintained. The lung apices are not visualized.  IMPRESSION: Solid fusion changes at C3-4.  No complicating features.  Normal alignment and no acute bony findings or significant degenerative changes.   Electronically Signed   By: Marijo Sanes M.D.   On: 08/18/2014 15:48    Cervical DG Myelogram views: No results found for this or any previous visit. Cervical DG Myelogram views: No results found for this or any previous visit. Cervical Discogram views: No results found for this or any previous visit.  Shoulder Imaging: Shoulder-R MR w contrast: No results found for this or any previous visit. Shoulder-L MR w contrast: No results found for this or any previous visit. Shoulder-R MR w/wo contrast: No results found for this or any previous visit. Shoulder-L MR w/wo contrast: No results found for this or any previous visit. Shoulder-R MR wo contrast: No results found for this or any previous visit. Shoulder-L MR wo contrast: No results found for this or any previous visit. Shoulder-R CT w contrast: No results found for this or any previous visit. Shoulder-L CT w contrast: No results found for this or any previous visit. Shoulder-R CT w/wo contrast: No results found for this or any previous visit. Shoulder-L CT w/wo contrast: No results found for this or any previous visit. Shoulder-R CT wo contrast: No results found for this or any previous visit. Shoulder-L CT wo contrast: No results found for this or any previous visit. Shoulder-R DG Arthrogram: No results found for this or any previous visit. Shoulder-L DG Arthrogram: No results found for this or any previous visit. Shoulder-R DG 1 view: No results found for this or any previous  visit. Shoulder-L DG 1 view: No results found for this or any previous visit. Shoulder-R DG: No results found for this or any previous visit. Shoulder-L DG: No results found for this or any previous visit.  Thoracic Imaging: Thoracic MR wo contrast: No results found for this or any previous visit. Thoracic MR wo contrast: No procedure found. Thoracic MR w/wo contrast: No results found for this or any previous visit. Thoracic MR w contrast: No results found for this or any previous visit. Thoracic CT wo contrast: No  results found for this or any previous visit. Thoracic CT w/wo contrast: No results found for this or any previous visit. Thoracic CT w/wo contrast: No results found for this or any previous visit. Thoracic CT w contrast: No results found for this or any previous visit. Thoracic DG 2-3 views: No results found for this or any previous visit. Thoracic DG 4 views: No results found for this or any previous visit. Thoracic DG: No results found for this or any previous visit. Thoracic DG w/swimmers view: No results found for this or any previous visit. Thoracic DG Myelogram views: No results found for this or any previous visit. Thoracic DG Myelogram views: No results found for this or any previous visit.  Lumbosacral Imaging: Lumbar MR wo contrast: No results found for this or any previous visit. Lumbar MR wo contrast: No procedure found. Lumbar MR w/wo contrast: No results found for this or any previous visit. Lumbar MR w/wo contrast: No results found for this or any previous visit. Lumbar MR w contrast: No results found for this or any previous visit. Lumbar CT wo contrast: No results found for this or any previous visit. Lumbar CT w/wo contrast: No results found for this or any previous visit. Lumbar CT w/wo contrast: No results found for this or any previous visit. Lumbar CT w contrast: No results found for this or any previous visit. Lumbar DG 1V: No results found for this  or any previous visit. Lumbar DG 1V (Clearing): No results found for this or any previous visit. Lumbar DG 2-3V (Clearing): No results found for this or any previous visit. Lumbar DG 2-3 views: No results found for this or any previous visit. Lumbar DG (Complete) 4+V: No results found for this or any previous visit.       Lumbar DG F/E views: No results found for this or any previous visit.       Lumbar DG Bending views: No results found for this or any previous visit.       Lumbar DG Myelogram views: No results found for this or any previous visit. Lumbar DG Myelogram: No results found for this or any previous visit. Lumbar DG Myelogram: No results found for this or any previous visit. Lumbar DG Myelogram: No results found for this or any previous visit. Lumbar DG Myelogram Lumbosacral: No results found for this or any previous visit. Lumbar DG Diskogram views: No results found for this or any previous visit. Lumbar DG Diskogram views: No results found for this or any previous visit. Lumbar DG Epidurogram OP: No results found for this or any previous visit. Lumbar DG Epidurogram IP: No results found for this or any previous visit.  Sacroiliac Joint Imaging: Sacroiliac Joint DG: No results found for this or any previous visit. Sacroiliac Joint MR w/wo contrast: No results found for this or any previous visit. Sacroiliac Joint MR wo contrast: No results found for this or any previous visit.  Spine Imaging: Whole Spine DG Myelogram views: No results found for this or any previous visit. Whole Spine MR Mets screen: No results found for this or any previous visit. Whole Spine MR Mets screen: No results found for this or any previous visit. Whole Spine MR w/wo: No results found for this or any previous visit. MRA Spinal Canal w/ cm: No results found for this or any previous visit. MRA Spinal Canal wo/ cm: No procedure found. MRA Spinal Canal w/wo cm: No results found for this or any previous  visit. Spine  Outside MR Films: No results found for this or any previous visit. Spine Outside CT Films: No results found for this or any previous visit. CT-Guided Biopsy: No results found for this or any previous visit. CT-Guided Needle Placement: No results found for this or any previous visit. DG Spine outside: No results found for this or any previous visit. IR Spine outside: No results found for this or any previous visit. NM Spine outside: No results found for this or any previous visit.  Hip Imaging: Hip-R MR w contrast: No results found for this or any previous visit. Hip-L MR w contrast: No results found for this or any previous visit. Hip-R MR w/wo contrast: No results found for this or any previous visit. Hip-L MR w/wo contrast: No results found for this or any previous visit. Hip-R MR wo contrast: No results found for this or any previous visit. Hip-L MR wo contrast: No results found for this or any previous visit. Hip-R CT w contrast: No results found for this or any previous visit. Hip-L CT w contrast: No results found for this or any previous visit. Hip-R CT w/wo contrast: No results found for this or any previous visit. Hip-L CT w/wo contrast: No results found for this or any previous visit. Hip-R CT wo contrast: No results found for this or any previous visit. Hip-L CT wo contrast: No results found for this or any previous visit. Hip-R DG 2-3 views: No results found for this or any previous visit. Hip-L DG 2-3 views: No results found for this or any previous visit. Hip-R DG Arthrogram: No results found for this or any previous visit. Hip-L DG Arthrogram: No results found for this or any previous visit. Hip-B DG Bilateral: No results found for this or any previous visit.  Knee Imaging: Knee-R MR w contrast: No results found for this or any previous visit. Knee-L MR w contrast: No results found for this or any previous visit. Knee-R MR w/wo contrast: No results found for  this or any previous visit. Knee-L MR w/wo contrast: No results found for this or any previous visit. Knee-R MR wo contrast: No results found for this or any previous visit. Knee-L MR wo contrast: No results found for this or any previous visit. Knee-R CT w contrast: No results found for this or any previous visit. Knee-L CT w contrast: No results found for this or any previous visit. Knee-R CT w/wo contrast: No results found for this or any previous visit. Knee-L CT w/wo contrast: No results found for this or any previous visit. Knee-R CT wo contrast: No results found for this or any previous visit. Knee-L CT wo contrast: No results found for this or any previous visit. Knee-R DG 1-2 views: No results found for this or any previous visit. Knee-L DG 1-2 views: No results found for this or any previous visit. Knee-R DG 3 views: No results found for this or any previous visit. Knee-L DG 3 views: No results found for this or any previous visit. Knee-R DG 4 views: No results found for this or any previous visit. Knee-L DG 4 views: No results found for this or any previous visit. Knee-R DG Arthrogram: No results found for this or any previous visit. Knee-L DG Arthrogram: No results found for this or any previous visit.  Ankle Imaging: Ankle-R DG Complete: No results found for this or any previous visit. Ankle-L DG Complete: No results found for this or any previous visit.  Foot Imaging: Foot-R DG Complete:  No results found for this or any previous visit. Foot-L DG Complete: No results found for this or any previous visit.  Elbow Imaging: Elbow-R DG Complete:  Results for orders placed during the hospital encounter of 07/22/15  DG Elbow Complete Right   Narrative CLINICAL DATA:  Right arm redness, swelling and pain for 3 days.  EXAM: RIGHT ELBOW - COMPLETE 3+ VIEW  COMPARISON:  None.  FINDINGS: There is no evidence of fracture, dislocation, or joint effusion. There is no evidence of  arthropathy or other focal bone abnormality. Soft tissues are unremarkable.  IMPRESSION: No acute fracture or dislocation.   Electronically Signed   By: Abelardo Diesel M.D.   On: 07/22/2015 20:00    Elbow-L DG Complete: No results found for this or any previous visit.  Wrist Imaging: Wrist-R DG Complete: No results found for this or any previous visit. Wrist-L DG Complete: No results found for this or any previous visit.  Hand Imaging: Hand-R DG Complete: No results found for this or any previous visit. Hand-L DG Complete: No results found for this or any previous visit.  Complexity Note: Imaging results reviewed. Results shared with Mr. Humphres, using Layman's terms.                         [place]  Drug: Mr. Meriwether  reports no history of drug use. Alcohol:  reports no history of alcohol use. Tobacco:  reports that he has been smoking cigarettes and cigars. He has a 34.00 pack-year smoking history. He has quit using smokeless tobacco. Medical:  has a past medical history of Anxiety, Anxiety, Arthritis, Blood dyscrasia, CAD (coronary artery disease), Cervical spondylosis without myelopathy (07/24/2014), Cervicogenic headache (07/24/2014), Chronic kidney disease, Depression, Diabetes mellitus without complication ([place]), Fatty liver, GERD (gastroesophageal reflux disease), Headache(784.0), Hyperlipidemia, Hypertension, Mental disorder, MI, old, RLS (restless legs syndrome), Shortness of breath, and Ventricular fibrillation ([place]) (04/05/2016). Family: family history includes ADD / ADHD in his daughter and son; Anxiety disorder in his mother and sister; COPD in his sister; Depression in his mother and sister; Diabetes in his sister; Hypertension in his mother and sister; Kidney Stones in his mother; Prostate cancer in his father; Seizures in his sister.  Past Surgical History:  Procedure Laterality Date  . ANTERIOR CERVICAL DECOMP/DISCECTOMY FUSION  11/13/2011   Procedure: ANTERIOR CERVICAL  DECOMPRESSION/DISCECTOMY FUSION 1 LEVEL;  Surgeon: [name], MD;  Location: [facility];  Service: Neurosurgery;  Laterality: N/A;  Anterior Cervical Decompression/discectomy Fusion. Cervical three-four.  Marland Kitchen CARDIAC CATHETERIZATION N/A 01/01/2015   Procedure: Left Heart Cath and Coronary Angiography;  Surgeon: [name], MD;  Location: [facility];  Service: Cardiovascular;  Laterality: N/A;  . CARDIAC CATHETERIZATION N/A 04/05/2016   Procedure: Left Heart Cath and Coronary Angiography;  Surgeon: Belva Crome, MD;  Location: [facility];  Service: Cardiovascular;  Laterality: N/A;  . CARDIAC CATHETERIZATION N/A 04/05/2016   Procedure: Coronary Stent Intervention;  Surgeon: Belva Crome, MD;  Location: [facility];  Service: Cardiovascular;  Laterality: N/A;  . CARDIAC CATHETERIZATION N/A 04/05/2016   Procedure: Intravascular Ultrasound/IVUS;  Surgeon: Belva Crome, MD;  Location: [facility];  Service: Cardiovascular;  Laterality: N/A;  . NASAL SINUS SURGERY     2005   Active Ambulatory Problems    Diagnosis Date Noted  . Cervical spondylosis without myelopathy 07/24/2014  . Tobacco use disorder 10/20/2010  . Ventricular fibrillation ([place]) 04/05/2016  .  Sedative, hypnotic or anxiolytic use disorder, severe, dependence ([place]) 05/12/2016  . HTN (hypertension) 06/26/2016  . GERD (gastroesophageal reflux disease) 06/26/2016  . Alcohol use disorder, severe, 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[place]) 06/26/2016  . Asthma 10/20/2010  . Combined hyperlipidemia 01/05/2016  . Coronary artery disease 09/12/2016  . Chronic Suicidal ideation 09/15/2016  . Benzodiazepine abuse ([place]) 06/11/2016  . Severe episode of recurrent major depressive disorder, without psychotic features ([place]) 04/15/2013  . Uncontrolled type 2 diabetes mellitus without complication, without long-term current use of insulin ([place]) 10/19/2016  . Chronic pain syndrome 11/21/2016  . Long term prescription  benzodiazepine use 11/21/2016  . Opiate use 11/21/2016  . Chronic neck pain (Primary Area of Pain) (Left) 11/22/2016  . Cervical facet syndrome (Left) 11/22/2016  . Cervical radiculitis (Left) 11/22/2016  . History of cervical spinal surgery 11/22/2016  . Cervical spondylosis 11/22/2016  . Chronic shoulder pain (Secondary Area of Pain) (Left) 11/22/2016  . Arthropathy of shoulder (Left) 11/22/2016  . Chronic low back pain [facility] Area of Pain) (Bilateral) (L>R) 11/22/2016  . Lumbar facet syndrome (Bilateral) (L>R) 11/22/2016  . Generalized anxiety disorder 02/05/2017  . Severe recurrent major depression without psychotic features ([place]) 03/12/2017  . Dysthymia 10/26/2017  . MDD (major depressive disorder), recurrent episode, severe ([place]) 11/20/2017  . Personality disorder ([place]) 12/31/2017  . Depression, major, recurrent, severe with psychosis ([place]) 03/30/2018  . Overdose 05/17/2018   Resolved Ambulatory Problems    Diagnosis Date Noted  . Deflected nasal septum 02/12/2013  . MDD (major depressive disorder), severe ([place]) 09/12/2016  . Depression, major, recurrent, moderate ([place]) 03/10/2017   Past Medical History:  Diagnosis Date  . Anxiety   . Anxiety   . Arthritis   . Blood dyscrasia   . CAD (coronary artery disease)   . Cervicogenic headache 07/24/2014  . Chronic kidney disease   . Depression   . Diabetes mellitus without complication ([place])   . Fatty liver   . Headache(784.0)   . Hyperlipidemia   . Hypertension   . Mental disorder   . MI, old   . RLS (restless legs syndrome)   . Shortness of breath    Assessment  Primary Diagnosis & Pertinent Problem List: There were no encounter diagnoses.  Visit Diagnosis (New problems to examiner): No diagnosis found. Plan of Care (Initial workup plan)  Note: Mr. Lucarelli was reminded that as per protocol, today's visit has been an evaluation only. We have not taken over the patient's controlled substance  management.  Problem-specific plan: No problem-specific Assessment & Plan notes found for this encounter.  Lab Orders  No laboratory test(s) ordered today   Imaging Orders  No imaging studies ordered today   Referral Orders  No referral(s) requested today   Procedure Orders    No procedure(s) ordered today   Pharmacotherapy (current): Medications ordered:  No orders of the defined types were placed in this encounter.  Medications administered during this visit: Karma Lew had no medications administered during this visit.   Pharmacological management options:  Opioid Analgesics: The patient was informed that there is no guarantee that he would be a candidate for opioid analgesics. The decision will be made following CDC guidelines. This decision will be based on the results of diagnostic studies, as well as Mr. Calleros risk profile.   Membrane stabilizer: To be determined at a later time  Muscle relaxant: To be determined at a later time  NSAID: To be determined at a later time  Other analgesic(s): To be  determined at a later time   Interventional management options: Mr. Laurent was informed that there is no guarantee that he would be a candidate for interventional therapies. The decision will be based on the results of diagnostic studies, as well as Mr. Sproles risk profile.  Procedure(s) under consideration:  ***   Provider-requested follow-up: No follow-ups on file.  Future Appointments  Date Time Provider [place]  07/31/2018 11:30 AM Gillis Santa, MD [facility] None    Total duration of non-face-to-face encounter: *** minutes.  Primary Care Physician: Langley Gauss Primary Care Location: [facility] Note by: Gillis Santa, MD Date: 07/31/2018; Time: 1:11 PM

## 2018-07-31 ENCOUNTER — Other Ambulatory Visit: Payer: Self-pay

## 2018-07-31 ENCOUNTER — Ambulatory Visit
Payer: Medicare Other | Attending: Student in an Organized Health Care Education/Training Program | Admitting: Student in an Organized Health Care Education/Training Program

## 2018-08-02 DIAGNOSIS — M542 Cervicalgia: Secondary | ICD-10-CM | POA: Diagnosis not present

## 2018-09-03 ENCOUNTER — Other Ambulatory Visit: Payer: Self-pay

## 2018-09-03 NOTE — Patient Outreach (Signed)
[facility]) Care Management  09/03/2018  Michael Schmitt 04-06-1970 [id number]   Medication Adherence call to Mr. Michael Schmitt patient did not answer pt is past due on Atorvastatin 80 mg under [place].   [place] Management Direct Dial [phone]  Fax [phone] [email or web address]

## 2018-09-20 ENCOUNTER — Emergency Department
Admission: EM | Admit: 2018-09-20 | Discharge: 2018-09-21 | Disposition: A | Discharge status: Home / Self Care | Payer: Medicare Other | Attending: Internal Medicine | Admitting: Internal Medicine

## 2018-09-20 ENCOUNTER — Other Ambulatory Visit: Payer: Self-pay

## 2018-09-20 ENCOUNTER — Encounter: Payer: Self-pay | Admitting: Emergency Medicine

## 2018-09-20 DIAGNOSIS — F1014 Alcohol abuse with alcohol-induced mood disorder: Secondary | ICD-10-CM | POA: Diagnosis present

## 2018-09-20 DIAGNOSIS — E119 Type 2 diabetes mellitus without complications: Secondary | ICD-10-CM | POA: Diagnosis not present

## 2018-09-20 DIAGNOSIS — F419 Anxiety disorder, unspecified: Secondary | ICD-10-CM | POA: Insufficient documentation

## 2018-09-20 DIAGNOSIS — R258 Other abnormal involuntary movements: Secondary | ICD-10-CM | POA: Diagnosis not present

## 2018-09-20 DIAGNOSIS — Z046 Encounter for general psychiatric examination, requested by authority: Secondary | ICD-10-CM

## 2018-09-20 DIAGNOSIS — F331 Major depressive disorder, recurrent, moderate: Secondary | ICD-10-CM | POA: Diagnosis not present

## 2018-09-20 DIAGNOSIS — F1010 Alcohol abuse, uncomplicated: Secondary | ICD-10-CM

## 2018-09-20 DIAGNOSIS — I10 Essential (primary) hypertension: Secondary | ICD-10-CM | POA: Diagnosis not present

## 2018-09-20 DIAGNOSIS — I2510 Atherosclerotic heart disease of native coronary artery without angina pectoris: Secondary | ICD-10-CM | POA: Insufficient documentation

## 2018-09-20 DIAGNOSIS — F17210 Nicotine dependence, cigarettes, uncomplicated: Secondary | ICD-10-CM | POA: Insufficient documentation

## 2018-09-20 DIAGNOSIS — R4689 Other symptoms and signs involving appearance and behavior: Secondary | ICD-10-CM | POA: Diagnosis present

## 2018-09-20 LAB — CBC
HCT: 42.6 % (ref 39.0–52.0)
Hemoglobin: 14.8 g/dL (ref 13.0–17.0)
MCH: 31.6 pg (ref 26.0–34.0)
MCHC: 34.7 g/dL (ref 30.0–36.0)
MCV: 91 fL (ref 80.0–100.0)
Platelets: 211 10*3/uL (ref 150–400)
RBC: 4.68 MIL/uL (ref 4.22–5.81)
RDW: 12.8 % (ref 11.5–15.5)
WBC: 7.8 10*3/uL (ref 4.0–10.5)
nRBC: 0 % (ref 0.0–0.2)

## 2018-09-20 LAB — COMPREHENSIVE METABOLIC PANEL
ALT: 65 U/L — ABNORMAL HIGH (ref 0–44)
AST: 53 U/L — ABNORMAL HIGH (ref 15–41)
Albumin: 4.1 g/dL (ref 3.5–5.0)
Alkaline Phosphatase: 108 U/L (ref 38–126)
Anion gap: 15 (ref 5–15)
BUN: 12 mg/dL (ref 6–20)
CO2: 19 mmol/L — ABNORMAL LOW (ref 22–32)
Calcium: 9.2 mg/dL (ref 8.9–10.3)
Chloride: 102 mmol/L (ref 98–111)
Creatinine, Ser: 0.98 mg/dL (ref 0.61–1.24)
GFR calc Af Amer: >60 mL/min (ref >60)
GFR calc non Af Amer: >60 mL/min (ref >60)
Glucose, Bld: 289 mg/dL — ABNORMAL HIGH (ref 70–99)
Potassium: 3.6 mmol/L (ref 3.5–5.1)
Sodium: 136 mmol/L (ref 135–145)
Total Bilirubin: 0.5 mg/dL (ref 0.3–1.2)
Total Protein: 7.2 g/dL (ref 6.5–8.1)

## 2018-09-20 LAB — ETHANOL: Alcohol, Ethyl (B): 82 mg/dL — ABNORMAL HIGH (ref <10)

## 2018-09-20 NOTE — ED Triage Notes (Signed)
Pt ambulatory to triage with no difficulty. Pt brought to ED by [facility] with IVC papers. Pt has been drinking today and became aggressive with his family.

## 2018-09-20 NOTE — ED Notes (Signed)
Pt. Admits to alcohol consumption today and getting into argument with family.  Pt. Loud and belligerent, MD at bedside.  Pt. Agrees to lay in bed and sleep off alcohol.  Pt. Denies SI/HI at this time.

## 2018-09-20 NOTE — ED Notes (Signed)
Patients bed adjusted and blanket given.

## 2018-09-20 NOTE — ED Provider Notes (Signed)
[facility] Emergency Department Provider Note       Time seen: ----------------------------------------- 10:36 PM on 09/20/2018 -----------------------------------------   I have reviewed the triage vital signs and the nursing notes.  HISTORY   Chief Complaint Medical Clearance and Mental Health Problem    HPI Ritter Helsley is a 48 y.o. male with a history of anxiety, arthritis, coronary artery disease, depression, diabetes, hyper tension, hyperlipidemia who presents to the ED for involuntary commitment.  Patient states his been drinking today and became aggressive with his family.  His discomfort is 7 out of 10 although he cannot describe where this is.  He presents screaming and angry.  Past Medical History:  Diagnosis Date  . Anxiety   . Anxiety   . Arthritis    cerv. stenosis, spondylosis, HNP- lower back , has been followed in pain clinic, has  had injection s in cerv. area  . Blood dyscrasia    told that when he was young he was a" free bleeder"  . CAD (coronary artery disease)   . Cervical spondylosis without myelopathy 07/24/2014  . Cervicogenic headache 07/24/2014  . Chronic kidney disease    renal calculi- passed spontaneously  . Depression   . Diabetes mellitus without complication ([place])   . Fatty liver   . GERD (gastroesophageal reflux disease)   . Headache(784.0)   . Hyperlipidemia   . Hypertension   . Mental disorder   . MI, old   . RLS (restless legs syndrome)    detected on sleep study  . Shortness of breath   . Ventricular fibrillation ([place]) 04/05/2016    Patient Active Problem List   Diagnosis Date Noted  . Overdose 05/17/2018  . Depression, major, recurrent, severe with psychosis ([place]) 03/30/2018  . Personality disorder ([place]) 12/31/2017  . MDD (major depressive disorder), recurrent episode, severe ([place]) 11/20/2017  . Dysthymia 10/26/2017  . Severe recurrent major depression without psychotic features ([place])  03/12/2017  . Generalized anxiety disorder 02/05/2017  . Chronic neck pain (Primary Area of Pain) (Left) 11/22/2016  . Cervical facet syndrome (Left) 11/22/2016  . Cervical radiculitis (Left) 11/22/2016  . History of cervical spinal surgery 11/22/2016  . Cervical spondylosis 11/22/2016  . Chronic shoulder pain (Secondary Area of Pain) (Left) 11/22/2016  . Arthropathy of shoulder (Left) 11/22/2016  . Chronic low back pain [facility] Area of Pain) (Bilateral) (L>R) 11/22/2016  . Lumbar facet syndrome (Bilateral) (L>R) 11/22/2016  . Chronic pain syndrome 11/21/2016  . Long term prescription benzodiazepine use 11/21/2016  . Opiate use 11/21/2016  . Uncontrolled type 2 diabetes mellitus without complication, without long-term current use of insulin ([place]) 10/19/2016  . Chronic Suicidal ideation 09/15/2016  . Coronary artery disease 09/12/2016  . HTN (hypertension) 06/26/2016  . GERD (gastroesophageal reflux disease) 06/26/2016  . Alcohol use disorder, severe, 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[place]) 06/26/2016  . Benzodiazepine abuse ([place]) 06/11/2016  . Sedative, hypnotic or anxiolytic use disorder, severe, dependence ([place]) 05/12/2016  . Ventricular fibrillation ([place]) 04/05/2016  . Combined hyperlipidemia 01/05/2016  . Cervical spondylosis without myelopathy 07/24/2014  . Severe episode of recurrent major depressive disorder, without psychotic features ([place]) 04/15/2013  . Tobacco use disorder 10/20/2010  . Asthma 10/20/2010    Past Surgical History:  Procedure Laterality Date  . ANTERIOR CERVICAL DECOMP/DISCECTOMY FUSION  11/13/2011   Procedure: ANTERIOR CERVICAL DECOMPRESSION/DISCECTOMY FUSION 1 LEVEL;  Surgeon: [name], MD;  Location: [place] NEURO ORS;  Service: Neurosurgery;  Laterality: N/A;  Anterior Cervical Decompression/discectomy Fusion. Cervical three-four.  Marland Kitchen  CARDIAC CATHETERIZATION N/A 01/01/2015   Procedure: Left Heart Cath and Coronary Angiography;  Surgeon: [name], MD;  Location: [place] CV LAB;  Service: Cardiovascular;  Laterality: N/A;  . CARDIAC CATHETERIZATION N/A 04/05/2016   Procedure: Left Heart Cath and Coronary Angiography;  Surgeon: Belva Crome, MD;  Location: [place] CV LAB;  Service: Cardiovascular;  Laterality: N/A;  . CARDIAC CATHETERIZATION N/A 04/05/2016   Procedure: Coronary Stent Intervention;  Surgeon: Belva Crome, MD;  Location: [place] CV LAB;  Service: Cardiovascular;  Laterality: N/A;  . CARDIAC CATHETERIZATION N/A 04/05/2016   Procedure: Intravascular Ultrasound/IVUS;  Surgeon: Belva Crome, MD;  Location: [place] CV LAB;  Service: Cardiovascular;  Laterality: N/A;  . NASAL SINUS SURGERY     2005    Allergies Prednisone, Hydrocodone, Trazodone and nefazodone, and Varenicline  Social History Social History   Tobacco Use  . Smoking status: Current Every Day Smoker    Packs/day: 2.00    Years: 17.00    Pack years: 34.00    Types: Cigarettes, Cigars  . Smokeless tobacco: Former Systems developer  . Tobacco comment: occassional snuff   Substance Use Topics  . Alcohol use: No    Alcohol/week: 0.0 standard drinks    Comment: last drinked in 3 months.   . Drug use: No   Review of Systems Constitutional: Negative for fever. Cardiovascular: Negative for chest pain. Respiratory: Negative for shortness of breath. Gastrointestinal: Negative for abdominal pain, vomiting and diarrhea. Musculoskeletal: Negative for back pain. Skin: Negative for rash. Neurological: Negative for headaches, focal weakness or numbness.  All systems negative/normal/unremarkable except as stated in the HPI  ____________________________________________   PHYSICAL EXAM:  VITAL SIGNS: ED Triage Vitals  Enc Vitals Group     BP 09/20/18 2153 (!) 155/101     Pulse Rate 09/20/18 2153 (!) 111     Resp 09/20/18 2153 20     Temp 09/20/18 2153 98.9 F (37.2 C)     Temp Source 09/20/18 2153 Oral     SpO2 09/20/18 2153 95 %     Weight 09/20/18 2155 217 lb  (98.4 kg)     Height 09/20/18 2155 6\' 2"  (1.88 m)     Head Circumference --      Peak Flow --      Pain Score 09/20/18 2154 7     Pain Loc --      Pain Edu? --      Excl. in [place]? --    Constitutional: Agitated, no distress Eyes: Conjunctivae are normal. Normal extraocular movements. Cardiovascular: Normal rate, regular rhythm. No murmurs, rubs, or gallops. Respiratory: Normal respiratory effort without tachypnea nor retractions. Breath sounds are clear and equal bilaterally. No wheezes/rales/rhonchi. Gastrointestinal: Soft and nontender. Normal bowel sounds Musculoskeletal: Nontender with normal range of motion in extremities. No lower extremity tenderness nor edema. Neurologic:  Normal speech and language. No gross focal neurologic deficits are appreciated.  Skin:  Skin is warm, dry and intact. No rash noted. Psychiatric: Agitated mood ___________________________________________  ED COURSE:  As part of my medical decision making, I reviewed the following data within the [place] History obtained from family if available, nursing notes, old chart and ekg, as well as notes from prior ED visits. Patient presented for involuntary commitment, we will assess with labs as indicated at this time.   Procedures  Reilly Blades was evaluated in Emergency Department on 09/20/2018 for the symptoms described in the history of present illness. He  was evaluated in the context of the global COVID-19 pandemic, which necessitated consideration that the patient might be at risk for infection with the SARS-CoV-2 virus that causes COVID-19. Institutional protocols and algorithms that pertain to the evaluation of patients at risk for COVID-19 are in a state of rapid change based on information released by regulatory bodies including the CDC and federal and state organizations. These policies and algorithms were followed during the patient's care in the ED.   ____________________________________________   LABS (pertinent positives/negatives)  Labs Reviewed  CBC  COMPREHENSIVE METABOLIC PANEL  ETHANOL  URINE DRUG SCREEN, QUALITATIVE ([place])  ____________________________________________   DIFFERENTIAL DIAGNOSIS   Intoxication, depression, substance abuse, chronic pain  FINAL ASSESSMENT AND PLAN  Alcohol intoxication, involuntary commitment   Plan: The patient had presented for involuntary commitment. Patient's labs did reveal alcohol intoxication without other acute process.  He appears medically clear for psychiatric evaluation and disposition.   Laurence Aly, MD    Note: This note was generated in part or whole with voice recognition software. Voice recognition is usually quite accurate but there are transcription errors that can and very often do occur. I apologize for any typographical errors that were not detected and corrected.     Earleen Newport, MD 09/20/18 2237

## 2018-09-20 NOTE — ED Notes (Signed)
Spoke with TTS, advised patient was asleep right now but when patient woke, will proceed to have TTS evaluation completed.

## 2018-09-21 DIAGNOSIS — F1014 Alcohol abuse with alcohol-induced mood disorder: Secondary | ICD-10-CM | POA: Diagnosis present

## 2018-09-21 DIAGNOSIS — F331 Major depressive disorder, recurrent, moderate: Secondary | ICD-10-CM | POA: Diagnosis not present

## 2018-09-21 LAB — URINE DRUG SCREEN, QUALITATIVE (ARMC ONLY)
Amphetamines, Ur Screen: NOT DETECTED
Barbiturates, Ur Screen: NOT DETECTED
Benzodiazepine, Ur Scrn: POSITIVE — AB
Cannabinoid 50 Ng, Ur ~~LOC~~: NOT DETECTED
Cocaine Metabolite,Ur ~~LOC~~: NOT DETECTED
MDMA (Ecstasy)Ur Screen: NOT DETECTED
Methadone Scn, Ur: NOT DETECTED
Opiate, Ur Screen: NOT DETECTED
Phencyclidine (PCP) Ur S: NOT DETECTED
Tricyclic, Ur Screen: POSITIVE — AB

## 2018-09-21 MED ORDER — LORAZEPAM 2 MG/ML IJ SOLN
2.0000 mg | Freq: Once | INTRAMUSCULAR | Status: AC
  Administered 2018-09-21: 2 mg via INTRAMUSCULAR
  Filled 2018-09-21: qty 1

## 2018-09-21 MED ORDER — ZIPRASIDONE MESYLATE 20 MG IM SOLR
20.0000 mg | Freq: Once | INTRAMUSCULAR | Status: DC
  Filled 2018-09-21: qty 20

## 2018-09-21 MED ORDER — DIPHENHYDRAMINE HCL 50 MG/ML IJ SOLN
50.0000 mg | Freq: Once | INTRAMUSCULAR | Status: AC
  Administered 2018-09-21: 50 mg via INTRAMUSCULAR
  Filled 2018-09-21: qty 1

## 2018-09-21 MED ORDER — CALCIUM CARBONATE ANTACID 500 MG PO CHEW
1.0000 | CHEWABLE_TABLET | Freq: Once | ORAL | Status: AC
  Administered 2018-09-21: 200 mg via ORAL
  Filled 2018-09-21: qty 1

## 2018-09-21 NOTE — Consult Note (Addendum)
[facility] Psych ED Discharge  09/21/2018 10:11 AM Earley Grobe  MRN:  [id number] Principal Problem: Alcohol abuse with alcohol-induced mood disorder [facility]) Discharge Diagnoses: Principal Problem:   Alcohol abuse with alcohol-induced mood disorder ([place])  Subjective: "I am ready to go home."  Patient was drinking and called the police as he was having suicidal ideations.  Screaming until he got an Ativan injection and calmed instantly.  Assessed later and he denies suicidal/homicidal ideations, hallucinations, and withdrawal symptoms.  Mild anxiety related to wanting to leave.  His alcohol use increases with stressors and decreases with Ativan.  Patient encouraged to follow up with is provider at [facility].  Stable for discharge once collateral obtained.  Collateral Obtained from his sister, Tabb Croghan.who seemed frustrated he was drinking last night and upset their mother but does not feel he will hurt himself or others at this time.    On admission via TTS:   Haden Suder is an 48 y.o. male. Who presents under involuntary commitment due to aggression. IVC document states that the pts family members feel that that the pt has been aggressive and states that he will hurt him self.Patient well known to the service with multiple admissions. Patient has had multiple visit to the emergency department with the history of alcohol abuse. Pt was intoxicated, screaming and angry upon presentation. At the time of this interview pt presents as cooperative but irritable. He states that he is ready to be discharged home.  Pt presents as oriented X4. Pt. Admits to alcohol consumption today and getting into argument with family although he state that this is his first time drinking alcohol in some time. Pt. denies any suicidal ideation, plan or intent. Pt. denies the presence of any auditory or visual hallucinations at this time. Patient denies any other medical complaints. He reports that he is compliant with  his mediations and active at [facility].   Total Time spent with patient: 1 hour  Past Psychiatric History: depression, substance abuse, anxiety  Past Medical History:  Past Medical History:  Diagnosis Date  . Anxiety   . Anxiety   . Arthritis    cerv. stenosis, spondylosis, HNP- lower back , has been followed in pain clinic, has  had injection s in cerv. area  . Blood dyscrasia    told that when he was young he was a" free bleeder"  . CAD (coronary artery disease)   . Cervical spondylosis without myelopathy 07/24/2014  . Cervicogenic headache 07/24/2014  . Chronic kidney disease    renal calculi- passed spontaneously  . Depression   . Diabetes mellitus without complication ([place])   . Fatty liver   . GERD (gastroesophageal reflux disease)   . Headache(784.0)   . Hyperlipidemia   . Hypertension   . Mental disorder   . MI, old   . RLS (restless legs syndrome)    detected on sleep study  . Shortness of breath   . Ventricular fibrillation ([place]) 04/05/2016    Past Surgical History:  Procedure Laterality Date  . ANTERIOR CERVICAL DECOMP/DISCECTOMY FUSION  11/13/2011   Procedure: ANTERIOR CERVICAL DECOMPRESSION/DISCECTOMY FUSION 1 LEVEL;  Surgeon: [name], MD;  Location: [place] NEURO ORS;  Service: Neurosurgery;  Laterality: N/A;  Anterior Cervical Decompression/discectomy Fusion. Cervical three-four.  Marland Kitchen CARDIAC CATHETERIZATION N/A 01/01/2015   Procedure: Left Heart Cath and Coronary Angiography;  Surgeon: [name], MD;  Location: [place] CV LAB;  Service: Cardiovascular;  Laterality: N/A;  . CARDIAC CATHETERIZATION N/A 04/05/2016  Procedure: Left Heart Cath and Coronary Angiography;  Surgeon: Belva Crome, MD;  Location: [place] CV LAB;  Service: Cardiovascular;  Laterality: N/A;  . CARDIAC CATHETERIZATION N/A 04/05/2016   Procedure: Coronary Stent Intervention;  Surgeon: Belva Crome, MD;  Location: [place] CV LAB;  Service: Cardiovascular;  Laterality: N/A;  .  CARDIAC CATHETERIZATION N/A 04/05/2016   Procedure: Intravascular Ultrasound/IVUS;  Surgeon: Belva Crome, MD;  Location: [place] CV LAB;  Service: Cardiovascular;  Laterality: N/A;  . NASAL SINUS SURGERY     2005   Family History:  Family History  Problem Relation Age of Onset  . Prostate cancer Father   . Hypertension Mother   . Kidney Stones Mother   . Anxiety disorder Mother   . Depression Mother   . COPD Sister   . Hypertension Sister   . Diabetes Sister   . Depression Sister   . Anxiety disorder Sister   . Seizures Sister   . ADD / ADHD Son   . ADD / ADHD Daughter    Family Psychiatric  History: see above Social History:  Social History   Substance and Sexual Activity  Alcohol Use No  . Alcohol/week: 0.0 standard drinks   Comment: last drinked in 3 months.      Social History   Substance and Sexual Activity  Drug Use No    Social History   Socioeconomic History  . Marital status: Single    Spouse name: Not on file  . Number of children: 3  . Years of education: 39  . Highest education level: Not on file  Occupational History    Comment: unemployed  Social Needs  . Financial resource strain: Not on file  . Food insecurity    Worry: Not on file    Inability: Not on file  . Transportation needs    Medical: Not on file    Non-medical: Not on file  Tobacco Use  . Smoking status: Current Every Day Smoker    Packs/day: 2.00    Years: 17.00    Pack years: 34.00    Types: Cigarettes, Cigars  . Smokeless tobacco: Former Systems developer  . Tobacco comment: occassional snuff   Substance and Sexual Activity  . Alcohol use: No    Alcohol/week: 0.0 standard drinks    Comment: last drinked in 3 months.   . Drug use: No  . Sexual activity: Not Currently  Lifestyle  . Physical activity    Days per week: Not on file    Minutes per session: Not on file  . Stress: Not on file  Relationships  . Social Herbalist on phone: Not on file    Gets together:  Not on file    Attends religious service: Not on file    Active member of club or organization: Not on file    Attends meetings of clubs or organizations: Not on file    Relationship status: Not on file  Other Topics Concern  . Not on file  Social History Narrative   Patient is right handed.   Patient drinks 2 sodas daily.    Has this patient used any form of tobacco in the last 30 days? (Cigarettes, Smokeless Tobacco, Cigars, and/or Pipes) A prescription for an FDA-approved tobacco cessation medication was offered at discharge and the patient refused  Current Medications: Current Facility-Administered Medications  Medication Dose Route Frequency Provider Last Rate Last Dose  . ziprasidone (GEODON) injection 20 mg  20  mg Intramuscular Once Delman Kitten, MD   Stopped at 09/21/18 [phone]   Current Outpatient Medications  Medication Sig Dispense Refill  . albuterol (PROVENTIL HFA;VENTOLIN HFA) 108 (90 Base) MCG/ACT inhaler Inhale 2 puffs into the lungs every 6 (six) hours as needed for wheezing.    Marland Kitchen aspirin 81 MG EC tablet Take 1 tablet (81 mg total) by mouth daily. For heart health 30 tablet 1  . atorvastatin (LIPITOR) 80 MG tablet Take 1 tablet (80 mg total) by mouth daily at 6 PM. For high cholesterol (Patient taking differently: Take 80 mg by mouth daily. For high cholesterol) 30 tablet 0  . carvedilol (COREG) 6.25 MG tablet Take 1 tablet (6.25 mg total) by mouth 2 (two) times daily with a meal. For high blood pressure 60 tablet 0  . clonazePAM (KLONOPIN) 0.5 MG tablet Take 1 tablet (0.5 mg total) by mouth 2 (two) times daily. 60 tablet 0  . cloNIDine (CATAPRES) 0.1 MG tablet Take 1 tablet (0.1 mg total) by mouth 3 (three) times daily for 30 days. 90 tablet 0  . clopidogrel (PLAVIX) 75 MG tablet Take 75 mg by mouth daily.    . diphenhydrAMINE (BENADRYL ALLERGY) 25 MG tablet Take 2 tablets (50 mg total) by mouth at bedtime for 30 days. 60 tablet 0  . divalproex (DEPAKOTE) 500 MG DR tablet  Take 500 mg (1 tablet) in the morning and 1000 mg (2 tablets) at night 90 tablet 0  . fluPHENAZine (PROLIXIN) 5 MG tablet Take 3 tablets (15 mg total) by mouth at bedtime. 90 tablet 0  . fluPHENAZine decanoate (PROLIXIN) 25 MG/ML injection Inject 1 mL (25 mg total) into the muscle every 21 ( twenty-one) days. To be given at doctor's office 5 mL 0  . fluvoxaMINE (LUVOX) 100 MG tablet Take 1 tablet (100 mg total) by mouth at bedtime. 30 tablet 0  . fluvoxaMINE (LUVOX) 50 MG tablet Take 1 tablet (50 mg total) by mouth daily with breakfast. 30 tablet 0  . gabapentin (NEURONTIN) 300 MG capsule Take 3 capsules (900 mg total) by mouth 3 (three) times daily. 270 capsule 0  . gemfibrozil (LOPID) 600 MG tablet Take 600 mg by mouth 2 (two) times daily.    Marland Kitchen ibuprofen (ADVIL,MOTRIN) 800 MG tablet Take 2,400 mg by mouth 3 (three) times daily as needed.    . lamoTRIgine (LAMICTAL) 100 MG tablet Take 1 tablet (100 mg total) by mouth daily. 30 tablet 0  . lamoTRIgine (LAMICTAL) 25 MG tablet Take 25 mg by mouth 2 (two) times daily.    Marland Kitchen lisinopril (PRINIVIL,ZESTRIL) 5 MG tablet Take 5 mg by mouth daily.    . metFORMIN (GLUCOPHAGE) 500 MG tablet Take 1 tablet (500 mg total) by mouth 2 (two) times daily with a meal. For diabetes management    . nitroGLYCERIN (NITROSTAT) 0.4 MG SL tablet Place 1 tablet (0.4 mg total) under the tongue every 5 (five) minutes x 3 doses as needed. For chest pain 10 tablet 0  . rOPINIRole (REQUIP) 0.25 MG tablet Take 1 tablet (0.25 mg total) by mouth at bedtime for 30 days. 30 tablet 1   PTA Medications: (Not in a hospital admission)   Musculoskeletal: Strength & Muscle Tone: within normal limits Gait & Station: normal Patient leans: N/A  Psychiatric Specialty Exam: Physical Exam  Nursing note and vitals reviewed. Constitutional: He is oriented to person, place, and time. He appears well-developed and well-nourished.  HENT:  Head: Normocephalic.  Neck: Normal range of motion.  Respiratory: Effort normal.  Musculoskeletal: Normal range of motion.  Neurological: He is alert and oriented to person, place, and time.  Psychiatric: His speech is normal and behavior is normal. Judgment and thought content normal. His mood appears anxious. His affect is blunt. Cognition and memory are normal.    Review of Systems  Psychiatric/Behavioral: Positive for substance abuse. The patient is nervous/anxious.   All other systems reviewed and are negative.   Blood pressure (!) 155/101, pulse (!) 111, temperature 98.9 F (37.2 C), temperature source Oral, resp. rate 20, height 6\' 2"  (1.88 m), weight 98.4 kg, SpO2 95 %.Body mass index is 27.86 kg/m.  General Appearance: Casual  Eye Contact:  Good  Speech:  Normal Rate  Volume:  Normal  Mood:  Anxious, mild  Affect:  Blunt  Thought Process:  Coherent and Descriptions of Associations: Intact  Orientation:  Full (Time, Place, and Person)  Thought Content:  WDL and Logical  Suicidal Thoughts:  No  Homicidal Thoughts:  No  Memory:  Immediate;   Good Recent;   Good Remote;   Good  Judgement:  Fair  Insight:  Fair  Psychomotor Activity:  Normal  Concentration:  Concentration: Good and Attention Span: Good  Recall:  Good  Fund of Knowledge:  Good  Language:  Good  Akathisia:  No  Handed:  Right  AIMS (if indicated):     Assets:  Housing Leisure Time Physical Health Resilience Social Support  ADL's:  Intact  Cognition:  WNL  Sleep:        Demographic Factors:  Male and Caucasian  Loss Factors: NA  Historical Factors: NA  Risk Reduction Factors:   Sense of responsibility to family, Living with another person, especially a relative, Positive social support and Positive therapeutic relationship  Continued Clinical Symptoms:  Mild anxiety  Cognitive Features That Contribute To Risk:  None    Suicide Risk:  Minimal: No identifiable suicidal ideation.  Patients presenting with no risk factors but with morbid  ruminations; may be classified as minimal risk based on the severity of the depressive symptoms   Plan Of Care/Follow-up recommendations:  Alcohol abuse with alcohol induced mood disorder: -Ativan 2 mg once IM with Benadryl 50 mg IM -Continued gabapentin 300 mg TID  Major depressive disorder, recurrent, mild: -Continued Luvox 50 mg in am and 100 mg in pm -Continued Lamictal 125 mg daily   Activity:  as tolerated Diet:  heart healthy diet  Disposition: discharge home after consulting Dr Mallie Darting who reviewed this patient and concurs with the plan. Waylan Boga, NP 09/21/2018, 10:11 AM

## 2018-09-21 NOTE — ED Notes (Signed)
IM Geodon will be held. Pt calm at this time.

## 2018-09-21 NOTE — ED Notes (Signed)
Patient in room yelling I need HELP.... patient states I want to die!!!

## 2018-09-21 NOTE — ED Provider Notes (Signed)
Patient has calm and.  We will hold Geodon order, discussed with nurse Amy.  Patient is now alert, much more compliant with care   Delman Kitten, MD 09/21/18 [phone]

## 2018-09-21 NOTE — ED Notes (Signed)
IM medications ordered. Pt continuing to scream from his room and bathroom. Pt difficult to re-direct. Pt stating he came here for suicide.Maintained on 15 minute checks.

## 2018-09-21 NOTE — ED Notes (Signed)
Patient in room banging on walls and yelling I need help.

## 2018-09-21 NOTE — ED Provider Notes (Signed)
Patient seen by psychiatry.  They advised the patient is cleared to be discharged.  He is now calm, in no distress.  Resting comfortably.  Does not appear to have any criteria to suggest need for IVC or further inpatient psychiatric evaluation.  Patient requesting to be discharged.   Delman Kitten, MD 09/21/18 [id number]

## 2018-09-21 NOTE — ED Notes (Signed)
Pt discharged home with his sister. Refused discharge VS. Pt signed for discharge. All belongings returned to patient.

## 2018-09-21 NOTE — ED Provider Notes (Signed)
Patient screaming, hollering.  He is agitated, slapping the bed.  I have seen this type of behavior for Mr. Busta in the past, and to some extent I question if this is malingering or drug-seeking behavior.  He does however have a history of mood instability as well.  And given his elevating mood, I have ordered a dose of Ativan as well as Benadryl to help calm him.  We are currently awaiting psychiatry consultation   Delman Kitten, MD 09/21/18 [phone]

## 2018-09-21 NOTE — BH Assessment (Signed)
Assessment Note  Michael Schmitt is an 48 y.o. male. Who presents under involuntary commitment due to aggression. IVC document states that the pts family members feel that that the pt has been aggressive and states that he will hurt him self.Patient well known to the service with multiple admissions. Patient has had multiple visit to the emergency department with the history of alcohol abuse. Pt was intoxicated, screaming and angry upon presentation. At the time of this interview pt presents as cooperative but irritable. He states that he is ready to be discharged home.  Pt presents as oriented X4. Pt. Admits to alcohol consumption today and getting into argument with family although he state that this is his first time drinking alcohol in some time. Pt. denies any suicidal ideation, plan or intent. Pt. denies the presence of any auditory or visual hallucinations at this time. Patient denies any other medical complaints. He reports that he is compliant with his mediations and active at [facility].    Diagnosis: Depression   Past Medical History:  Past Medical History:  Diagnosis Date  . Anxiety   . Anxiety   . Arthritis    cerv. stenosis, spondylosis, HNP- lower back , has been followed in pain clinic, has  had injection s in cerv. area  . Blood dyscrasia    told that when he was young he was a" free bleeder"  . CAD (coronary artery disease)   . Cervical spondylosis without myelopathy 07/24/2014  . Cervicogenic headache 07/24/2014  . Chronic kidney disease    renal calculi- passed spontaneously  . Depression   . Diabetes mellitus without complication ([place])   . Fatty liver   . GERD (gastroesophageal reflux disease)   . Headache(784.0)   . Hyperlipidemia   . Hypertension   . Mental disorder   . MI, old   . RLS (restless legs syndrome)    detected on sleep study  . Shortness of breath   . Ventricular fibrillation ([place]) 04/05/2016    Past Surgical History:  Procedure Laterality Date   . ANTERIOR CERVICAL DECOMP/DISCECTOMY FUSION  11/13/2011   Procedure: ANTERIOR CERVICAL DECOMPRESSION/DISCECTOMY FUSION 1 LEVEL;  Surgeon: [name], MD;  Location: [facility];  Service: Neurosurgery;  Laterality: N/A;  Anterior Cervical Decompression/discectomy Fusion. Cervical three-four.  Marland Kitchen CARDIAC CATHETERIZATION N/A 01/01/2015   Procedure: Left Heart Cath and Coronary Angiography;  Surgeon: [name], MD;  Location: [place] CV LAB;  Service: Cardiovascular;  Laterality: N/A;  . CARDIAC CATHETERIZATION N/A 04/05/2016   Procedure: Left Heart Cath and Coronary Angiography;  Surgeon: Belva Crome, MD;  Location: [place] CV LAB;  Service: Cardiovascular;  Laterality: N/A;  . CARDIAC CATHETERIZATION N/A 04/05/2016   Procedure: Coronary Stent Intervention;  Surgeon: Belva Crome, MD;  Location: [place] CV LAB;  Service: Cardiovascular;  Laterality: N/A;  . CARDIAC CATHETERIZATION N/A 04/05/2016   Procedure: Intravascular Ultrasound/IVUS;  Surgeon: Belva Crome, MD;  Location: [place] CV LAB;  Service: Cardiovascular;  Laterality: N/A;  . NASAL SINUS SURGERY     2005    Family History:  Family History  Problem Relation Age of Onset  . Prostate cancer Father   . Hypertension Mother   . Kidney Stones Mother   . Anxiety disorder Mother   . Depression Mother   . COPD Sister   . Hypertension Sister   . Diabetes Sister   . Depression Sister   . Anxiety disorder Sister   . Seizures Sister   .  ADD / ADHD Son   . ADD / ADHD Daughter     Social History:  reports that he has been smoking cigarettes and cigars. He has a 34.00 pack-year smoking history. He has quit using smokeless tobacco. He reports that he does not drink alcohol or use drugs.  Additional Social History:     CIWA: CIWA-Ar BP: (!) 155/101 Pulse Rate: (!) 111 COWS:    Allergies:  Allergies  Allergen Reactions  . Prednisone Other (See Comments)    Hypertension and makes him feel "spacey"  .  Hydrocodone Other (See Comments)    Causes headaches and makes the patient irritable  . Trazodone And Nefazodone Other (See Comments)    Patient stated that this keeps him awake; "has an opposite effect on me"  . Varenicline Other (See Comments)    Suicidal thoughts    Home Medications: (Not in a hospital admission)   OB/GYN Status:  No LMP for male patient.  General Assessment Data Location of Assessment: [facility] ED TTS Assessment: In system Is this a Tele or Face-to-Face Assessment?: Face-to-Face Is this an Initial Assessment or a Re-assessment for this encounter?: Initial Assessment Patient Accompanied by:: N/A Language Other than English: No Living Arrangements: Other (Comment) What gender do you identify as?: Male Living Arrangements: Parent, Other relatives Can pt return to current living arrangement?: Yes Admission Status: Involuntary Petitioner: Police Is patient capable of signing voluntary admission?: No Referral Source: Other  Medical Screening Exam ([place]) Medical Exam completed: Yes  Crisis Care Plan Living Arrangements: Parent, Other relatives Legal Guardian: Other:(None ) Name of Psychiatrist: [facility] Name of Therapist: none   Education Status Is patient currently in school?: No Is the patient employed, unemployed or receiving disability?: Receiving disability income  Risk to self with the past 6 months Suicidal Ideation: No Has patient been a risk to self within the past 6 months prior to admission? : No Suicidal Intent: No Has patient had any suicidal intent within the past 6 months prior to admission? : No Is patient at risk for suicide?: No Suicidal Plan?: No Has patient had any suicidal plan within the past 6 months prior to admission? : No Access to Means: No What has been your use of drugs/alcohol within the last 12 months?: Alcohol use  Previous Attempts/Gestures: No How many times?: 0 Other Self Harm Risks: alcohol  use Intentional Self Injurious Behavior: None Family Suicide History: No Recent stressful life event(s): Conflict (Comment) Persecutory voices/beliefs?: No Depression: No Substance abuse history and/or treatment for substance abuse?: Yes Suicide prevention information given to non-admitted patients: Not applicable  Risk to Others within the past 6 months Homicidal Ideation: No Does patient have any lifetime risk of violence toward others beyond the six months prior to admission? : No Thoughts of Harm to Others: No Current Homicidal Intent: No Current Homicidal Plan: No Identified Victim: none  History of harm to others?: No Assessment of Violence: In past 6-12 months Violent Behavior Description: fighting family members  Does patient have access to weapons?: No Criminal Charges Pending?: No Does patient have a court date: No Is patient on probation?: No  Psychosis Hallucinations: None noted Delusions: None noted  Mental Status Report Appearance/Hygiene: In scrubs Eye Contact: Fair Motor Activity: Freedom of movement Speech: Pressured Level of Consciousness: Alert Mood: Irritable Affect: Angry Anxiety Level: Minimal Thought Processes: Coherent Judgement: Unimpaired Orientation: Place, Situation, Time, Person Obsessive Compulsive Thoughts/Behaviors: None  Cognitive Functioning Concentration: Fair Memory: Remote Intact, Recent Intact Is  patient IDD: No Insight: Fair Impulse Control: Fair Appetite: Fair Have you had any weight changes? : No Change Sleep: No Change Total Hours of Sleep: 6 Vegetative Symptoms: None  ADLScreening [facility] Assessment Services) Patient's cognitive ability adequate to safely complete daily activities?: Yes Patient able to express need for assistance with ADLs?: Yes Independently performs ADLs?: Yes (appropriate for developmental age)  Prior Inpatient Therapy Prior Inpatient Therapy: Yes Prior Therapy Dates: 05/17/18 Prior Therapy  Facilty/Provider(s): [facility] Reason for Treatment: Depression   Prior Outpatient Therapy Prior Outpatient Therapy: Yes Prior Therapy Dates: [place]  Prior Therapy Facilty/Provider(s): [facility]	[place] Reason for Treatment: Depression  Does patient have an ACCT team?: No Does patient have Intensive In-House Services?  : No Does patient have [facility] services? : No Does patient have P4CC services?: No  ADL Screening (condition at time of admission) Patient's cognitive ability adequate to safely complete daily activities?: Yes Patient able to express need for assistance with ADLs?: Yes Independently performs ADLs?: Yes (appropriate for developmental age)       Abuse/Neglect Assessment (Assessment to be complete while patient is alone) Abuse/Neglect Assessment Can Be Completed: Yes Physical Abuse: Denies Verbal Abuse: Denies Sexual Abuse: Denies Exploitation of patient/patient's resources: Denies Self-Neglect: Denies Values / Beliefs Cultural Requests During Hospitalization: None Spiritual Requests During Hospitalization: None Consults Spiritual Care Consult Needed: No Social Work Consult Needed: No   Nutrition Screen- [place] Adult/[facility]/AP Patient's home diet: (patient refused breakfast)        Disposition:  Disposition Initial Assessment Completed for this Encounter: Yes Patient referred to: Other (Comment)(Consult with Psych MD)  On Site Evaluation by:   Reviewed with Physician:    Laretta Alstrom 09/21/2018 7:46 AM

## 2018-09-21 NOTE — ED Provider Notes (Signed)
Psychiatry now present for evaluation.  Patient has escalated over the last hour, screaming out that he wanted medication.  He has been given Ativan and Benadryl, he continues to be highly agitated, elevated requesting to be medicated.  Geodon ordered as recommended by psychiatry team.   Delman Kitten, MD 09/21/18 [phone]

## 2018-09-21 NOTE — Discharge Instructions (Signed)
Alcohol Use Disorder °Alcohol use disorder is when your drinking disrupts your daily life. When you have this condition, you drink too much alcohol and you cannot control your drinking. °Alcohol use disorder can cause serious problems with your physical health. It can affect your brain, heart, liver, pancreas, immune system, stomach, and intestines. Alcohol use disorder can increase your risk for certain cancers and cause problems with your mental health, such as depression, anxiety, psychosis, delirium, and dementia. People with this disorder risk hurting themselves and others. °What are the causes? °This condition is caused by drinking too much alcohol over time. It is not caused by drinking too much alcohol only one or two times. Some people with this condition drink alcohol to cope with or escape from negative life events. Others drink to relieve pain or symptoms of mental illness. °What increases the risk? °You are more likely to develop this condition if: °· You have a family history of alcohol use disorder. °· Your culture encourages drinking to the point of intoxication, or makes alcohol easy to get. °· You had a mood or conduct disorder in childhood. °· You have been a victim of abuse. °· You are an adolescent and: °? You have poor grades or difficulties in school. °? Your caregivers do not talk to you about saying no to alcohol, or supervise your activities. °? You are impulsive or you have trouble with self-control. °What are the signs or symptoms? °Symptoms of this condition include: °· Drinking more than you want to. °· Drinking for longer than you want to. °· Trying several times to drink less or to control your drinking. °· Spending a lot of time getting alcohol, drinking, or recovering from drinking. °· Craving alcohol. °· Having problems at work, at school, or at home due to drinking. °· Having problems in relationships due to drinking. °· Drinking when it is dangerous to drink, such as before  driving a car. °· Continuing to drink even though you know you might have a physical or mental problem related to drinking. °· Needing more and more alcohol to get the same effect you want from the alcohol (building up tolerance). °· Having symptoms of withdrawal when you stop drinking. Symptoms of withdrawal include: °? Fatigue. °? Nightmares. °? Trouble sleeping. °? Depression. °? Anxiety. °? Fever. °? Seizures. °? Severe confusion. °? Feeling or seeing things that are not there (hallucinations). °? Tremors. °? Rapid heart rate. °? Rapid breathing. °? High blood pressure. °· Drinking to avoid symptoms of withdrawal. °How is this diagnosed? °This condition is diagnosed with an assessment. Your health care provider may start the assessment by asking three or four questions about your drinking. °Your health care provider may perform a physical exam or do lab tests to see if you have physical problems resulting from alcohol use. She or he may refer you to a mental health professional for evaluation. °How is this treated? °Some people with alcohol use disorder are able to reduce their alcohol use to low-risk levels. Others need to completely quit drinking alcohol. When necessary, mental health professionals with specialized training in substance use treatment can help. Your health care provider can help you decide how severe your alcohol use disorder is and what type of treatment you need. The following forms of treatment are available: °· Detoxification. Detoxification involves quitting drinking and using prescription medicines within the first week to help lessen withdrawal symptoms. This treatment is important for people who have had withdrawal symptoms before and for heavy drinkers   who are likely to have withdrawal symptoms. Alcohol withdrawal can be dangerous, and in severe cases, it can cause death. Detoxification may be provided in a home, community, or primary care setting, or in a hospital or substance use  treatment facility. °· Counseling. This treatment is also called talk therapy. It is provided by substance use treatment counselors. A counselor can address the reasons you use alcohol and suggest ways to keep you from drinking again or to prevent problem drinking. The goals of talk therapy are to: °? Find healthy activities and ways for you to cope with stress. °? Identify and avoid the things that trigger your alcohol use. °? Help you learn how to handle cravings. °· Medicines. Medicines can help treat alcohol use disorder by: °? Decreasing alcohol cravings. °? Decreasing the positive feeling you have when you drink alcohol. °? Causing an uncomfortable physical reaction when you drink alcohol (aversion therapy). °· Support groups. Support groups are led by people who have quit drinking. They provide emotional support, advice, and guidance. °These forms of treatment are often combined. Some people with this condition benefit from a combination of treatments provided by specialized substance use treatment centers. °Follow these instructions at home: °· Take over-the-counter and prescription medicines only as told by your health care provider. °· Check with your health care provider before starting any new medicines. °· Ask friends and family members not to offer you alcohol. °· Avoid situations where alcohol is served, including gatherings where others are drinking alcohol. °· Create a plan for what to do when you are tempted to use alcohol. °· Find hobbies or activities that you enjoy that do not include alcohol. °· Keep all follow-up visits as told by your health care provider. This is important. °How is this prevented? °· If you drink, limit alcohol intake to no more than 1 drink a day for nonpregnant women and 2 drinks a day for men. One drink equals 12 oz of beer, 5 oz of wine, or 1½ oz of hard liquor. °· If you have a mental health condition, get treatment and support. °· Do not give alcohol to  adolescents. °· If you are an adolescent: °? Do not drink alcohol. °? Do not be afraid to say no if someone offers you alcohol. Speak up about why you do not want to drink. You can be a positive role model for your friends and set a good example for those around you by not drinking alcohol. °? If your friends drink, spend time with others who do not drink alcohol. Make new friends who do not use alcohol. °? Find healthy ways to manage stress and emotions, such as meditation or deep breathing, exercise, spending time in nature, listening to music, or talking with a trusted friend or family member. °Contact a health care provider if: °· You are not able to take your medicines as told. °· Your symptoms get worse. °· You return to drinking alcohol (relapse) and your symptoms get worse. °Get help right away if: °· You have thoughts about hurting yourself or others. °If you ever feel like you may hurt yourself or others, or have thoughts about taking your own life, get help right away. You can go to your nearest emergency department or call: °· Your local emergency services (911 in the [place] °· A suicide crisis helpline, such as the National Suicide Prevention Lifeline at 1-800-273-8255. This is open 24 hours a day. °Summary °· Alcohol use disorder is when your drinking disrupts your daily   life. When you have this condition, you drink too much alcohol and you cannot control your drinking.  Treatment may include detoxification, counseling, medicine, and support groups.  Ask friends and family members not to offer you alcohol. Avoid situations where alcohol is served.  Get help right away if you have thoughts about hurting yourself or others. This information is not intended to replace advice given to you by your health care provider. Make sure you discuss any questions you have with your health care provider. Document Released: 04/20/2004 Document Revised: 02/23/2017 Document Reviewed: 12/09/2015 Elsevier Patient  Education  2020 [employer].  Follow up with [employer]

## 2018-09-21 NOTE — ED Notes (Signed)
Patient in room yelling and running to the walls yelling help.

## 2018-09-21 NOTE — ED Notes (Signed)
TTS consulted with the pts nursetoevaluate pts appropriateness for a assessment. He shares that the pt is drowsy and not alert at this time. Pt to be assessed when appropriate .

## 2018-09-24 ENCOUNTER — Emergency Department
Admission: EM | Admit: 2018-09-24 | Discharge: 2018-09-24 | Disposition: A | Discharge status: Home / Self Care | Payer: Medicare Other | Attending: Emergency Medicine | Admitting: Emergency Medicine

## 2018-09-24 ENCOUNTER — Other Ambulatory Visit: Payer: Self-pay

## 2018-09-24 DIAGNOSIS — Z7982 Long term (current) use of aspirin: Secondary | ICD-10-CM | POA: Insufficient documentation

## 2018-09-24 DIAGNOSIS — I129 Hypertensive chronic kidney disease with stage 1 through stage 4 chronic kidney disease, or unspecified chronic kidney disease: Secondary | ICD-10-CM | POA: Insufficient documentation

## 2018-09-24 DIAGNOSIS — N189 Chronic kidney disease, unspecified: Secondary | ICD-10-CM | POA: Insufficient documentation

## 2018-09-24 DIAGNOSIS — Z7984 Long term (current) use of oral hypoglycemic drugs: Secondary | ICD-10-CM | POA: Diagnosis not present

## 2018-09-24 DIAGNOSIS — I2510 Atherosclerotic heart disease of native coronary artery without angina pectoris: Secondary | ICD-10-CM | POA: Insufficient documentation

## 2018-09-24 DIAGNOSIS — F339 Major depressive disorder, recurrent, unspecified: Secondary | ICD-10-CM

## 2018-09-24 DIAGNOSIS — Z7902 Long term (current) use of antithrombotics/antiplatelets: Secondary | ICD-10-CM | POA: Diagnosis not present

## 2018-09-24 DIAGNOSIS — F919 Conduct disorder, unspecified: Secondary | ICD-10-CM | POA: Diagnosis not present

## 2018-09-24 DIAGNOSIS — F17200 Nicotine dependence, unspecified, uncomplicated: Secondary | ICD-10-CM | POA: Diagnosis not present

## 2018-09-24 DIAGNOSIS — R45851 Suicidal ideations: Secondary | ICD-10-CM | POA: Diagnosis not present

## 2018-09-24 DIAGNOSIS — E1122 Type 2 diabetes mellitus with diabetic chronic kidney disease: Secondary | ICD-10-CM | POA: Diagnosis not present

## 2018-09-24 DIAGNOSIS — I252 Old myocardial infarction: Secondary | ICD-10-CM | POA: Diagnosis not present

## 2018-09-24 DIAGNOSIS — J45909 Unspecified asthma, uncomplicated: Secondary | ICD-10-CM | POA: Insufficient documentation

## 2018-09-24 DIAGNOSIS — Z79899 Other long term (current) drug therapy: Secondary | ICD-10-CM | POA: Diagnosis not present

## 2018-09-24 DIAGNOSIS — F99 Mental disorder, not otherwise specified: Secondary | ICD-10-CM | POA: Diagnosis present

## 2018-09-24 LAB — URINE DRUG SCREEN, QUALITATIVE (ARMC ONLY)
Amphetamines, Ur Screen: NOT DETECTED
Barbiturates, Ur Screen: NOT DETECTED
Benzodiazepine, Ur Scrn: POSITIVE — AB
Cannabinoid 50 Ng, Ur ~~LOC~~: NOT DETECTED
Cocaine Metabolite,Ur ~~LOC~~: NOT DETECTED
MDMA (Ecstasy)Ur Screen: NOT DETECTED
Methadone Scn, Ur: NOT DETECTED
Opiate, Ur Screen: NOT DETECTED
Phencyclidine (PCP) Ur S: NOT DETECTED
Tricyclic, Ur Screen: NOT DETECTED

## 2018-09-24 LAB — CBC
HCT: 46.9 % (ref 39.0–52.0)
Hemoglobin: 16.5 g/dL (ref 13.0–17.0)
MCH: 32 pg (ref 26.0–34.0)
MCHC: 35.2 g/dL (ref 30.0–36.0)
MCV: 91.1 fL (ref 80.0–100.0)
Platelets: 274 10*3/uL (ref 150–400)
RBC: 5.15 MIL/uL (ref 4.22–5.81)
RDW: 12.9 % (ref 11.5–15.5)
WBC: 10.9 10*3/uL — ABNORMAL HIGH (ref 4.0–10.5)
nRBC: 0 % (ref 0.0–0.2)

## 2018-09-24 LAB — COMPREHENSIVE METABOLIC PANEL
ALT: 66 U/L — ABNORMAL HIGH (ref 0–44)
AST: 59 U/L — ABNORMAL HIGH (ref 15–41)
Albumin: 4.6 g/dL (ref 3.5–5.0)
Alkaline Phosphatase: 141 U/L — ABNORMAL HIGH (ref 38–126)
Anion gap: 17 — ABNORMAL HIGH (ref 5–15)
BUN: 11 mg/dL (ref 6–20)
CO2: 19 mmol/L — ABNORMAL LOW (ref 22–32)
Calcium: 9.6 mg/dL (ref 8.9–10.3)
Chloride: 98 mmol/L (ref 98–111)
Creatinine, Ser: 0.95 mg/dL (ref 0.61–1.24)
GFR calc Af Amer: >60 mL/min (ref >60)
GFR calc non Af Amer: >60 mL/min (ref >60)
Glucose, Bld: 362 mg/dL — ABNORMAL HIGH (ref 70–99)
Potassium: 3.9 mmol/L (ref 3.5–5.1)
Sodium: 134 mmol/L — ABNORMAL LOW (ref 135–145)
Total Bilirubin: 0.5 mg/dL (ref 0.3–1.2)
Total Protein: 7.8 g/dL (ref 6.5–8.1)

## 2018-09-24 LAB — SALICYLATE LEVEL: Salicylate Lvl: <7 mg/dL (ref 2.8–30.0)

## 2018-09-24 LAB — ETHANOL: Alcohol, Ethyl (B): <10 mg/dL (ref <10)

## 2018-09-24 LAB — ACETAMINOPHEN LEVEL: Acetaminophen (Tylenol), Serum: <10 ug/mL — ABNORMAL LOW (ref 10–30)

## 2018-09-24 MED ORDER — LAMOTRIGINE 25 MG PO TABS: 25.0000 mg | ORAL_TABLET | Freq: Every morning | ORAL | Status: DC

## 2018-09-24 MED ORDER — GABAPENTIN 600 MG PO TABS: 300.0000 mg | ORAL_TABLET | Freq: Three times a day (TID) | ORAL | Status: DC

## 2018-09-24 MED ORDER — METFORMIN HCL 500 MG PO TABS: 500.0000 mg | ORAL_TABLET | Freq: Two times a day (BID) | ORAL | Status: DC

## 2018-09-24 MED ORDER — DIVALPROEX SODIUM 500 MG PO DR TAB
500.0000 mg | DELAYED_RELEASE_TABLET | Freq: Two times a day (BID) | ORAL | Status: DC
  Filled 2018-09-24: qty 1

## 2018-09-24 MED ORDER — LAMOTRIGINE 100 MG PO TABS: 100.0000 mg | ORAL_TABLET | Freq: Every day | ORAL | Status: DC

## 2018-09-24 MED ORDER — CLOPIDOGREL BISULFATE 75 MG PO TABS
75.0000 mg | ORAL_TABLET | Freq: Every day | ORAL | Status: DC
  Administered 2018-09-24: 75 mg via ORAL
  Filled 2018-09-24: qty 1

## 2018-09-24 MED ORDER — LORAZEPAM 2 MG/ML IJ SOLN
1.0000 mg | Freq: Once | INTRAMUSCULAR | Status: AC
  Administered 2018-09-24: 1 mg via INTRAMUSCULAR
  Filled 2018-09-24: qty 1

## 2018-09-24 MED ORDER — DIVALPROEX SODIUM 500 MG PO DR TAB
500.0000 mg | DELAYED_RELEASE_TABLET | Freq: Two times a day (BID) | ORAL | Status: DC
  Administered 2018-09-24: 500 mg via ORAL

## 2018-09-24 MED ORDER — ASPIRIN EC 81 MG PO TBEC
81.0000 mg | DELAYED_RELEASE_TABLET | Freq: Every day | ORAL | Status: DC
  Administered 2018-09-24: 81 mg via ORAL
  Filled 2018-09-24: qty 1

## 2018-09-24 MED ORDER — DIPHENHYDRAMINE HCL 50 MG/ML IJ SOLN
50.0000 mg | Freq: Once | INTRAMUSCULAR | Status: AC
  Administered 2018-09-24: 50 mg via INTRAMUSCULAR
  Filled 2018-09-24: qty 1

## 2018-09-24 MED ORDER — FLUVOXAMINE MALEATE 50 MG PO TABS
100.0000 mg | ORAL_TABLET | Freq: Every day | ORAL | Status: DC
  Filled 2018-09-24: qty 2

## 2018-09-24 NOTE — ED Notes (Signed)
IVC PENDING  CONSULT ?

## 2018-09-24 NOTE — Discharge Instructions (Signed)
Please seek medical attention and help for any thoughts about wanting to harm yourself, harm others, any concerning change in behavior, severe depression, inappropriate drug use or any other new or concerning symptoms. ° °

## 2018-09-24 NOTE — ED Notes (Signed)
Pt reports he has been feeling like he wants to die. Pt states "Its all I can think about, I just want to die, I wake up every morning yelling at the top of my lungs and swinging my arms"  When asked about nightmares, pt denies any nightmares.  States "I want help this time, I feel like if I go home Im going to hurt myself"  Pt reports he does not have a specific plan for SI at this time.  States " I want to die, but I don't want to because of my kids and Im scared"  Pt reports having 3 children and does not want to hurt himself because he needs to take care of them.  When asked why he left ED during previous visit he states " I wanted to go home and see my kids, but this time I don't want to, I want to stay"  Since initial conversation with patient, he has stopped yelling and hitting himself at this time.

## 2018-09-24 NOTE — ED Notes (Signed)
Pt started yelling "help", "I need help"  Pt expresses concern about admitting process. States " I need long term treatment, I don't want to go home, I want to get help, I want in person long term help"  Pt getting upset because the is unsure of admission status, and does not wish to be discharged.   RN informed pt that psychiatric doctor is currently attempting to admit pt, but no note has been placed at this time regarding process or status.   Pt redirected and has stopped yelling.

## 2018-09-24 NOTE — ED Notes (Addendum)
Pt upset about discharge, pt unwilling to allow vital signs to be taken at this time and is currently yelling for his belongings to be given back. Pt was able to get into contact with his sister for transportation home. Pt given belongings and changed back into personal clothing, transported to lobby by police officer at this time.

## 2018-09-24 NOTE — ED Notes (Signed)
Pt currently yelling and " Help me" unable to redirect pt at this time. Pt hitting garage door and walls in room at this time

## 2018-09-24 NOTE — ED Notes (Signed)
Pt yelling and hitting self in head on arrival to room 23. Pt redirected at this time.

## 2018-09-24 NOTE — Consult Note (Addendum)
[facility] Psych Consult  09/24/2018 2:16 PM Michael Schmitt  MRN:  [id number] Principal Problem: <principal problem not specified> Discharge Diagnoses: Active Problems:   * No active hospital problems. *  Subjective: Patient is a 48 yo male with Alcohol dependence and Mood disorder and frequent visits to the ED, who is brought in by the police today in handcuffs after he threatened suicide.  Patient was just here 2 days ago intoxicated on alcohol and presenting with suicidal symptoms.  However he was able to calm down and requested to go home.  Patient is well-known to this ED and to the inpatient unit and has a long history of noncompliance with his follow-up care. Patient tells this clinician that he does have suicidal thoughts but does not have a specific plan.  States that he wants to go inpatient.  He is asking this clinician if he has a chance that inpatient care. Initially.  And yelling stating that he needs his Ativan. At this time he denies any psychotic symptoms.  He denies use of any alcohol.  He reports that he is compliant with his medications and active at [facility].   Total Time spent with patient: 20 minutes  Past Psychiatric History: depression, substance abuse, anxiety  Past Medical History:  Past Medical History:  Diagnosis Date  . Anxiety   . Anxiety   . Arthritis    cerv. stenosis, spondylosis, HNP- lower back , has been followed in pain clinic, has  had injection s in cerv. area  . Blood dyscrasia    told that when he was young he was a" free bleeder"  . CAD (coronary artery disease)   . Cervical spondylosis without myelopathy 07/24/2014  . Cervicogenic headache 07/24/2014  . Chronic kidney disease    renal calculi- passed spontaneously  . Depression   . Diabetes mellitus without complication ( [place])   . Fatty liver   . GERD (gastroesophageal reflux disease)   . Headache(784.0)   . Hyperlipidemia   . Hypertension   . Mental disorder   . MI, old   . RLS  (restless legs syndrome)    detected on sleep study  . Shortness of breath   . Ventricular fibrillation ([place]) 04/05/2016    Past Surgical History:  Procedure Laterality Date  . ANTERIOR CERVICAL DECOMP/DISCECTOMY FUSION  11/13/2011   Procedure: ANTERIOR CERVICAL DECOMPRESSION/DISCECTOMY FUSION 1 LEVEL;  Surgeon: [name], MD;  Location: [place] [facility]UR[facility] ORS;  Service: Neurosurgery;  Laterality: N/A;  Anterior Cervical Decompression/discectomy Fusion. Cervical three-four.  Marland Kitchen CARDIAC CATHETERIZATION N/A 01/01/2015   Procedure: Left Heart Cath and Coronary Angiography;  Surgeon: [name], MD;  Location: [place] CV LAB;  Service: Cardiovascular;  Laterality: N/A;  . CARDIAC CATHETERIZATION N/A 04/05/2016   Procedure: Left Heart Cath and Coronary Angiography;  Surgeon: Belva Crome, MD;  Location: [place] CV LAB;  Service: Cardiovascular;  Laterality: N/A;  . CARDIAC CATHETERIZATION N/A 04/05/2016   Procedure: Coronary Stent Intervention;  Surgeon: Belva Crome, MD;  Location: [place] CV LAB;  Service: Cardiovascular;  Laterality: N/A;  . CARDIAC CATHETERIZATION N/A 04/05/2016   Procedure: Intravascular Ultrasound/IVUS;  Surgeon: Belva Crome, MD;  Location: [place] CV LAB;  Service: Cardiovascular;  Laterality: N/A;  . NASAL SINUS SURGERY     2005   Family History:  Family History  Problem Relation Age of Onset  . Prostate cancer Father   . Hypertension Mother   . Kidney Stones Mother   . Anxiety disorder  Mother   . Depression Mother   . COPD Sister   . Hypertension Sister   . Diabetes Sister   . Depression Sister   . Anxiety disorder Sister   . Seizures Sister   . ADD / ADHD Son   . ADD / ADHD Daughter    Family Psychiatric  History: see above Social History:  Social History   Substance and Sexual Activity  Alcohol Use No  . Alcohol/week: 0.0 standard drinks   Comment: last drinked in 3 months.      Social History   Substance and Sexual Activity  Drug Use No     Social History   Socioeconomic History  . Marital status: Single    Spouse name: Not on file  . Number of children: 3  . Years of education: 69  . Highest education level: Not on file  Occupational History    Comment: unemployed  Social Needs  . Financial resource strain: Not on file  . Food insecurity    Worry: Not on file    Inability: Not on file  . Transportation needs    Medical: Not on file    Non-medical: Not on file  Tobacco Use  . Smoking status: Current Every Day Smoker    Packs/day: 2.00    Years: 17.00    Pack years: 34.00    Types: Cigarettes, Cigars  . Smokeless tobacco: Former Systems developer  . Tobacco comment: occassional snuff   Substance and Sexual Activity  . Alcohol use: No    Alcohol/week: 0.0 standard drinks    Comment: last drinked in 3 months.   . Drug use: No  . Sexual activity: Not Currently  Lifestyle  . Physical activity    Days per week: Not on file    Minutes per session: Not on file  . Stress: Not on file  Relationships  . Social Herbalist on phone: Not on file    Gets together: Not on file    Attends religious service: Not on file    Active member of club or organization: Not on file    Attends meetings of clubs or organizations: Not on file    Relationship status: Not on file  Other Topics Concern  . Not on file  Social History Narrative   Patient is right handed.   Patient drinks 2 sodas daily.    Has this patient used any form of tobacco in the last 30 days? (Cigarettes, Smokeless Tobacco, Cigars, and/or Pipes) A prescription for an FDA-approved tobacco cessation medication was offered at discharge and the patient refused in his visit 2 days ago.  Current Medications: Current Facility-Administered Medications  Medication Dose Route Frequency Provider Last Rate Last Dose  . aspirin EC tablet 81 mg  81 mg Oral Daily Delman Kitten, MD      . clopidogrel (PLAVIX) tablet 75 mg  75 mg Oral Daily Delman Kitten, MD      .  divalproex (DEPAKOTE) DR tablet 500 mg  500 mg Oral Q12H Sotero Brinkmeyer, MD      . fluvoxaMINE (LUVOX) tablet 100 mg  100 mg Oral QHS Eufemia Prindle, MD      . gabapentin (NEURONTIN) tablet 300 mg  300 mg Oral TID Zyanne Schumm, MD      . metFORMIN (GLUCOPHAGE) tablet 500 mg  500 mg Oral BID WC Delman Kitten, MD       Current Outpatient Medications  Medication Sig Dispense Refill  . albuterol (PROVENTIL HFA;VENTOLIN  HFA) 108 (90 Base) MCG/ACT inhaler Inhale 2 puffs into the lungs every 6 (six) hours as needed for wheezing.    Marland Kitchen aspirin 81 MG EC tablet Take 1 tablet (81 mg total) by mouth daily. For heart health 30 tablet 1  . atorvastatin (LIPITOR) 80 MG tablet Take 1 tablet (80 mg total) by mouth daily at 6 PM. For high cholesterol (Patient taking differently: Take 80 mg by mouth daily. For high cholesterol) 30 tablet 0  . carvedilol (COREG) 6.25 MG tablet Take 1 tablet (6.25 mg total) by mouth 2 (two) times daily with a meal. For high blood pressure 60 tablet 0  . cloNIDine (CATAPRES) 0.1 MG tablet Take 1 tablet (0.1 mg total) by mouth 3 (three) times daily for 30 days. 90 tablet 0  . clopidogrel (PLAVIX) 75 MG tablet Take 75 mg by mouth daily.    . divalproex (DEPAKOTE) 500 MG DR tablet Take 500 mg (1 tablet) in the morning and 1000 mg (2 tablets) at night 90 tablet 0  . fluPHENAZine (PROLIXIN) 5 MG tablet Take 3 tablets (15 mg total) by mouth at bedtime. 90 tablet 0  . fluPHENAZine decanoate (PROLIXIN) 25 MG/ML injection Inject 1 mL (25 mg total) into the muscle every 21 ( twenty-one) days. To be given at doctor's office 5 mL 0  . fluvoxaMINE (LUVOX) 100 MG tablet Take 1 tablet (100 mg total) by mouth at bedtime. 30 tablet 0  . fluvoxaMINE (LUVOX) 50 MG tablet Take 1 tablet (50 mg total) by mouth daily with breakfast. 30 tablet 0  . gabapentin (NEURONTIN) 300 MG capsule Take 3 capsules (900 mg total) by mouth 3 (three) times daily. 270 capsule 0  . gemfibrozil (LOPID) 600 MG tablet Take 600  mg by mouth 2 (two) times daily.    Marland Kitchen ibuprofen (ADVIL,MOTRIN) 800 MG tablet Take 2,400 mg by mouth 3 (three) times daily as needed.    . lamoTRIgine (LAMICTAL) 100 MG tablet Take 1 tablet (100 mg total) by mouth daily. 30 tablet 0  . lamoTRIgine (LAMICTAL) 25 MG tablet Take 25 mg by mouth 2 (two) times daily.    Marland Kitchen lisinopril (PRINIVIL,ZESTRIL) 5 MG tablet Take 5 mg by mouth daily.    . metFORMIN (GLUCOPHAGE) 500 MG tablet Take 1 tablet (500 mg total) by mouth 2 (two) times daily with a meal. For diabetes management    . nitroGLYCERIN (NITROSTAT) 0.4 MG SL tablet Place 1 tablet (0.4 mg total) under the tongue every 5 (five) minutes x 3 doses as needed. For chest pain 10 tablet 0  . rOPINIRole (REQUIP) 0.25 MG tablet Take 1 tablet (0.25 mg total) by mouth at bedtime for 30 days. 30 tablet 1   PTA Medications: (Not in a hospital admission)   Musculoskeletal: Strength & Muscle Tone: within normal limits Gait & Station: normal Patient leans: N/A  Psychiatric Specialty Exam: Physical Exam  Nursing note and vitals reviewed. Constitutional: He is oriented to person, place, and time. He appears well-developed and well-nourished.  HENT:  Head: Normocephalic.  Neck: Normal range of motion.  Respiratory: Effort normal.  Musculoskeletal: Normal range of motion.  Neurological: He is alert and oriented to person, place, and time.  Psychiatric: His speech is normal and behavior is normal. Judgment and thought content normal. His mood appears anxious. His affect is blunt. Cognition and memory are normal.    Review of Systems  Psychiatric/Behavioral: Positive for substance abuse. The patient is nervous/anxious.   All other systems reviewed and are  negative.   Blood pressure 124/72, pulse 76, temperature 98.5 F (36.9 C), temperature source Oral, resp. rate 16, weight 98.4 kg, SpO2 98 %.Body mass index is 27.85 kg/m.  General Appearance: Disheveled  Eye Contact:  Good  Speech:  Normal Rate   Volume:  Normal  Mood: Depressed and irritable  Affect:  Blunt  Thought Process:  Coherent and Descriptions of Associations: Intact  Orientation:  Full (Time, Place, and Person)  Thought Content:  WDL and Logical  Suicidal Thoughts: Yes  Homicidal Thoughts:  No  Memory:  Immediate;   Good Recent;   Good Remote;   Good  Judgement: Impaired  Insight: Limited  Psychomotor Activity:  Normal  Concentration:  Concentration: Good and Attention Span: Good  Recall:  Good  Fund of Knowledge:  Good  Language:  Good  Akathisia:  No  Handed:  Right  AIMS (if indicated):     Assets:  Housing Leisure Time Physical Health Resilience Social Support  ADL's:  Intact  Cognition:  WNL  Sleep:         Treatment plan and summary  Patient is a 48 year old male with a history of alcohol dependence and mood disorder who presents with suicidal thoughts and agitated behavior. He was given Ativan and Benadryl to reduce his agitation.  Patient able to calm down some but continues to express suicidal thoughts and wanting long-term treatment.  TTS will try to obtain a bed for patient.  He will not be accepted at [facility] inpatient behavioral health unit due to multiple hospitalizations.  And the attending there feels like an admission will not help this patient.  Patient will be restarted on his home medications of-   - Gabapentin 300 mg TID - Luvox  100 mg in pm - Lamictal 125 mg daily -Depakote  500mg  2 tablets at bedtime   Disposition-Patient will be admitted to an inpatient behavioral health unit unless he denies any suicidal thoughts and states he would like to go back home and follow-up with outpatient care.     Elvin So, MD 09/24/2018, 2:16 PM

## 2018-09-24 NOTE — ED Provider Notes (Signed)
Patient is at his baseline status per previous ED visits. Per note psychiatry did not feel that patient would benefit from inpatient admission at this hospital. States that if patient felt like going home.  Discussed with psychiatry. They did confirm as was written in their note that he would not be admitted to [facility] inpatient. Patient is at his baseline. Will rescind IVC.  Will discharge.    Nance Pear, MD 09/24/18 [phone]

## 2018-09-24 NOTE — ED Provider Notes (Signed)
[facility] Emergency Department Provider Note   ____________________________________________   First MD Initiated Contact with Patient 09/24/18 1212     (approximate)  I have reviewed the triage vital signs and the nursing notes.   HISTORY  Chief Complaint Suicidal  EM caveat: Agitation  HPI Ken Bonn is a 48 y.o. male history of anxiety, coronary disease, depression, hypertension, hyperlipidemia  Patient presents today, reports he is suicidal.  Reports he does not want I necessarily kill himself because he has 2 kids, but he also wants to kill himself.  Evidently patient having some destructive or self-injurious type behavior prior to arrival as well, brought by the police  Patient reports suicidal ideation.  Patient reports needs something to help calm down.     Past Medical History:  Diagnosis Date  . Anxiety   . Anxiety   . Arthritis    cerv. stenosis, spondylosis, HNP- lower back , has been followed in pain clinic, has  had injection s in cerv. area  . Blood dyscrasia    told that when he was young he was a" free bleeder"  . CAD (coronary artery disease)   . Cervical spondylosis without myelopathy 07/24/2014  . Cervicogenic headache 07/24/2014  . Chronic kidney disease    renal calculi- passed spontaneously  . Depression   . Diabetes mellitus without complication ([place])   . Fatty liver   . GERD (gastroesophageal reflux disease)   . Headache(784.0)   . Hyperlipidemia   . Hypertension   . Mental disorder   . MI, old   . RLS (restless legs syndrome)    detected on sleep study  . Shortness of breath   . Ventricular fibrillation ([place]) 04/05/2016    Patient Active Problem List   Diagnosis Date Noted  . Alcohol abuse with alcohol-induced mood disorder ([place]) 09/21/2018  . Overdose 05/17/2018  . Depression, major, recurrent, severe with psychosis ([place]) 03/30/2018  . Personality disorder ([place]) 12/31/2017  . MDD (major depressive  disorder), recurrent episode, severe ([place]) 11/20/2017  . Dysthymia 10/26/2017  . Severe recurrent major depression without psychotic features ([place]) 03/12/2017  . Generalized anxiety disorder 02/05/2017  . Chronic neck pain (Primary Area of Pain) (Left) 11/22/2016  . Cervical facet syndrome (Left) 11/22/2016  . Cervical radiculitis (Left) 11/22/2016  . History of cervical spinal surgery 11/22/2016  . Cervical spondylosis 11/22/2016  . Chronic shoulder pain (Secondary Area of Pain) (Left) 11/22/2016  . Arthropathy of shoulder (Left) 11/22/2016  . Chronic low back pain [facility] Area of Pain) (Bilateral) (L>R) 11/22/2016  . Lumbar facet syndrome (Bilateral) (L>R) 11/22/2016  . Chronic pain syndrome 11/21/2016  . Long term prescription benzodiazepine use 11/21/2016  . Opiate use 11/21/2016  . Uncontrolled type 2 diabetes mellitus without complication, without long-term current use of insulin ([place]) 10/19/2016  . Chronic Suicidal ideation 09/15/2016  . Coronary artery disease 09/12/2016  . HTN (hypertension) 06/26/2016  . GERD (gastroesophageal reflux disease) 06/26/2016  . Alcohol use disorder, severe, 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[place]) 06/26/2016  . Benzodiazepine abuse ([place]) 06/11/2016  . Sedative, hypnotic or anxiolytic use disorder, severe, dependence ([place]) 05/12/2016  . Ventricular fibrillation ([place]) 04/05/2016  . Combined hyperlipidemia 01/05/2016  . Cervical spondylosis without myelopathy 07/24/2014  . Severe episode of recurrent major depressive disorder, without psychotic features ([place]) 04/15/2013  . Tobacco use disorder 10/20/2010  . Asthma 10/20/2010    Past Surgical History:  Procedure Laterality Date  . ANTERIOR CERVICAL DECOMP/DISCECTOMY FUSION  11/13/2011   Procedure: ANTERIOR  CERVICAL DECOMPRESSION/DISCECTOMY FUSION 1 LEVEL;  Surgeon: [name], MD;  Location: [facility]URO ORS;  Service: Neurosurgery;  Laterality: N/A;  Anterior Cervical Decompression/discectomy Fusion. Cervical  three-four.  Marland Kitchen CARDIAC CATHETERIZATION N/A 01/01/2015   Procedure: Left Heart Cath and Coronary Angiography;  Surgeon: [name], MD;  Location: [place] CV LAB;  Service: Cardiovascular;  Laterality: N/A;  . CARDIAC CATHETERIZATION N/A 04/05/2016   Procedure: Left Heart Cath and Coronary Angiography;  Surgeon: Belva Crome, MD;  Location: [place] CV LAB;  Service: Cardiovascular;  Laterality: N/A;  . CARDIAC CATHETERIZATION N/A 04/05/2016   Procedure: Coronary Stent Intervention;  Surgeon: Belva Crome, MD;  Location: [place] CV LAB;  Service: Cardiovascular;  Laterality: N/A;  . CARDIAC CATHETERIZATION N/A 04/05/2016   Procedure: Intravascular Ultrasound/IVUS;  Surgeon: Belva Crome, MD;  Location: [place] CV LAB;  Service: Cardiovascular;  Laterality: N/A;  . NASAL SINUS SURGERY     2005    Prior to Admission medications   Medication Sig Start Date End Date Taking? Authorizing Provider  albuterol (PROVENTIL HFA;VENTOLIN HFA) 108 (90 Base) MCG/ACT inhaler Inhale 2 puffs into the lungs every 6 (six) hours as needed for wheezing. 04/11/18 04/11/19  [provider]  aspirin 81 MG EC tablet Take 1 tablet (81 mg total) by mouth daily. For heart health 11/21/17   Lindell Spar I, NP  atorvastatin (LIPITOR) 80 MG tablet Take 1 tablet (80 mg total) by mouth daily at 6 PM. For high cholesterol Patient taking differently: Take 80 mg by mouth daily. For high cholesterol 11/21/17   Lindell Spar I, NP  carvedilol (COREG) 6.25 MG tablet Take 1 tablet (6.25 mg total) by mouth 2 (two) times daily with a meal. For high blood pressure 11/21/17   Nwoko, Herbert Pun I, NP  cloNIDine (CATAPRES) 0.1 MG tablet Take 1 tablet (0.1 mg total) by mouth 3 (three) times daily for 30 days. 05/16/18 06/15/18  Lavella Hammock, MD  clopidogrel (PLAVIX) 75 MG tablet Take 75 mg by mouth daily. 03/12/18 03/12/19  [provider]  divalproex (DEPAKOTE) 500 MG DR tablet Take 500 mg (1 tablet) in the morning  and 1000 mg (2 tablets) at night 05/23/18   Lavella Hammock, MD  fluPHENAZine (PROLIXIN) 5 MG tablet Take 3 tablets (15 mg total) by mouth at bedtime. 05/23/18   Lavella Hammock, MD  fluPHENAZine decanoate (PROLIXIN) 25 MG/ML injection Inject 1 mL (25 mg total) into the muscle every 21 ( twenty-one) days. To be given at [place] office 06/13/18   Lavella Hammock, MD  fluvoxaMINE (LUVOX) 100 MG tablet Take 1 tablet (100 mg total) by mouth at bedtime. 05/16/18   Lavella Hammock, MD  fluvoxaMINE (LUVOX) 50 MG tablet Take 1 tablet (50 mg total) by mouth daily with breakfast. 05/23/18   Lavella Hammock, MD  gabapentin (NEURONTIN) 300 MG capsule Take 3 capsules (900 mg total) by mouth 3 (three) times daily. 04/01/18   Clapacs, Madie Reno, MD  gemfibrozil (LOPID) 600 MG tablet Take 600 mg by mouth 2 (two) times daily. 04/04/18 04/04/19  [provider]  ibuprofen (ADVIL,MOTRIN) 800 MG tablet Take 2,400 mg by mouth 3 (three) times daily as needed.    [provider]  lamoTRIgine (LAMICTAL) 100 MG tablet Take 1 tablet (100 mg total) by mouth daily. 04/02/18   Clapacs, Madie Reno, MD  lamoTRIgine (LAMICTAL) 25 MG tablet Take 25 mg by mouth 2 (two) times daily. 04/29/18   [provider]  lisinopril (PRINIVIL,ZESTRIL) 5 MG tablet Take 5 mg by mouth daily. 03/12/18 03/12/19  [provider]  metFORMIN (GLUCOPHAGE) 500 MG tablet Take 1 tablet (500 mg total) by mouth 2 (two) times daily with a meal. For diabetes management 11/21/17   Lindell Spar I, NP  nitroGLYCERIN (NITROSTAT) 0.4 MG SL tablet Place 1 tablet (0.4 mg total) under the tongue every 5 (five) minutes x 3 doses as needed. For chest pain 11/21/17 11/21/18  Lindell Spar I, NP  rOPINIRole (REQUIP) 0.25 MG tablet Take 1 tablet (0.25 mg total) by mouth at bedtime for 30 days. 05/08/18 06/07/18  Lavella Hammock, MD    Allergies Prednisone, Amoxicillin, Hydrocodone, Varenicline, and Trazodone and nefazodone  Family History  Problem Relation  Age of Onset  . Prostate cancer Father   . Hypertension Mother   . Kidney Stones Mother   . Anxiety disorder Mother   . Depression Mother   . COPD Sister   . Hypertension Sister   . Diabetes Sister   . Depression Sister   . Anxiety disorder Sister   . Seizures Sister   . ADD / ADHD Son   . ADD / ADHD Daughter     Social History Social History   Tobacco Use  . Smoking status: Current Every Day Smoker    Packs/day: 2.00    Years: 17.00    Pack years: 34.00    Types: Cigarettes, Cigars  . Smokeless tobacco: Former Systems developer  . Tobacco comment: occassional snuff   Substance Use Topics  . Alcohol use: No    Alcohol/week: 0.0 standard drinks    Comment: last drinked in 3 months.   . Drug use: No    Review of Systems EM caveat   ____________________________________________   PHYSICAL EXAM:  VITAL SIGNS: ED Triage Vitals  Enc Vitals Group     BP 09/24/18 1201 (!) 166/106     Pulse Rate 09/24/18 1201 (!) 114     Resp 09/24/18 1201 16     Temp 09/24/18 1201 98.5 F (36.9 C)     Temp Source 09/24/18 1201 Oral     SpO2 09/24/18 1201 96 %     Weight 09/24/18 1158 216 lb 14.9 oz (98.4 kg)     Height --      Head Circumference --      Peak Flow --      Pain Score 09/24/18 1158 0     Pain Loc --      Pain Edu? --      Excl. in [place]? --     Constitutional: Alert and oriented.  Ambulatory and agitated.  Walked back calmly to his treatment room, then his symptoms he got in his room began screaming out that he needed help and needed something to calm him Eyes: Conjunctivae are normal. Head: Atraumatic. Neck: No stridor.  Cardiovascular: Good peripheral circulation. Respiratory: Normal respiratory effort.  No retractions. Musculoskeletal: Normal strength in all extremities.  Able to ambulate well. Neurologic: No focal deficits.  Elevated mood.  Agitated screaming.   Skin:  Skin is warm, dry and intact. No rash noted. Psychiatric: Mood and affect are anxious, reports  suicidal thoughts and ideations.  Seems slightly disorganized. ____________________________________________   LABS (all labs ordered are listed, but only abnormal results are displayed)  Labs Reviewed  COMPREHENSIVE METABOLIC PANEL - Abnormal; Notable for the following components:      Result Value   Sodium 134 (*)    CO2 19 (*)  Glucose, Bld 362 (*)    AST 59 (*)    ALT 66 (*)    Alkaline Phosphatase 141 (*)    Anion gap 17 (*)    All other components within normal limits  ACETAMINOPHEN LEVEL - Abnormal; Notable for the following components:   Acetaminophen (Tylenol), Serum <10 (*)    All other components within normal limits  CBC - Abnormal; Notable for the following components:   WBC 10.9 (*)    All other components within normal limits  URINE DRUG SCREEN, QUALITATIVE ([facility] ONLY) - Abnormal; Notable for the following components:   Benzodiazepine, Ur Scrn POSITIVE (*)    All other components within normal limits  ETHANOL  SALICYLATE LEVEL  VALPROIC ACID LEVEL   ____________________________________________  EKG   ____________________________________________  RADIOLOGY   ____________________________________________   PROCEDURES  Procedure(s) performed: None  Procedures  Critical Care performed: No  ____________________________________________   INITIAL IMPRESSION / ASSESSMENT AND PLAN / ED COURSE  Pertinent labs & imaging results that were available during my care of the patient were reviewed by me and considered in my medical decision making (see chart for details).   Patient presents for suicidal ideation.  Patient is requesting something to help calm him.  Patient is known to our hospital, placed a consultation to psychiatry, also patient placed under involuntary commitment is a them concerned about his voicing of suicidal thoughts and ideations as well as his presentation with agitation.  I have ordered Ativan and Benadryl to help calm him and discussed  with Dr. Einar Grad for consult.     ----------------------------------------- 1:25 PM on 09/24/2018 -----------------------------------------  Patient is now resting comfortably.  No acute distress.  Lab work reviewed, has slight elevated anion gap, hyperglycemia, but review of previous this appears consistent with prior evaluations as well.  Placed him back on his diabetes medications, he does not appear show acute extremitas, appear toxic, or show clinical evidence to suggest DKA.  ----------------------------------------- 3:22 PM on 09/24/2018 ----------------------------------------- Ongoing care assigned to Dr. Archie Balboa.  Psychiatry consult pending, patient currently under involuntary commitment.  ____________________________________________   FINAL CLINICAL IMPRESSION(S) / ED DIAGNOSES  Final diagnoses:  Disruptive behavior        Note:  This document was prepared using Dragon voice recognition software and may include unintentional dictation errors       Delman Kitten, MD 09/24/18 1622

## 2018-09-24 NOTE — ED Triage Notes (Signed)
Pt to ED in handcuffs but voluntarily for SI. Pt is hitting himself in triage and screaming, "I need help" Pt is easily redirected for a short period of time before hitting self and screaming again. Pt denies drug or alcohol use. Pt denies HI or hallucinations.

## 2018-09-25 ENCOUNTER — Ambulatory Visit: Payer: Medicare Other | Admitting: Urology

## 2018-09-26 DIAGNOSIS — Z955 Presence of coronary angioplasty implant and graft: Secondary | ICD-10-CM | POA: Diagnosis not present

## 2018-09-26 DIAGNOSIS — I10 Essential (primary) hypertension: Secondary | ICD-10-CM | POA: Diagnosis not present

## 2018-09-26 DIAGNOSIS — E119 Type 2 diabetes mellitus without complications: Secondary | ICD-10-CM | POA: Diagnosis not present

## 2018-09-26 DIAGNOSIS — E7849 Other hyperlipidemia: Secondary | ICD-10-CM | POA: Diagnosis not present

## 2018-10-09 DIAGNOSIS — Z79899 Other long term (current) drug therapy: Secondary | ICD-10-CM | POA: Diagnosis not present

## 2018-10-10 ENCOUNTER — Ambulatory Visit (INDEPENDENT_AMBULATORY_CARE_PROVIDER_SITE_OTHER): Payer: Medicare Other | Admitting: Urology

## 2018-10-10 ENCOUNTER — Other Ambulatory Visit: Payer: Self-pay

## 2018-10-10 ENCOUNTER — Encounter: Payer: Self-pay | Admitting: Urology

## 2018-10-10 VITALS — BP 127/87 | HR 103 | Ht 74.0 in | Wt 208.0 lb

## 2018-10-10 DIAGNOSIS — R7989 Other specified abnormal findings of blood chemistry: Secondary | ICD-10-CM

## 2018-10-10 DIAGNOSIS — N529 Male erectile dysfunction, unspecified: Secondary | ICD-10-CM

## 2018-10-10 MED ORDER — SILDENAFIL CITRATE 20 MG PO TABS: 20.0000 mg | ORAL_TABLET | Freq: Every day | ORAL | 0 refills | Status: DC | PRN

## 2018-10-10 NOTE — Patient Instructions (Signed)
Erectile Dysfunction Erectile dysfunction (ED) is the inability to get or keep an erection in order to have sexual intercourse. Erectile dysfunction may include:  Inability to get an erection.  Lack of enough hardness of the erection to allow penetration.  Loss of the erection before sex is finished. What are the causes? This condition may be caused by:  Certain medicines, such as: ? Pain relievers. ? Antihistamines. ? Antidepressants. ? Blood pressure medicines. ? Water pills (diuretics). ? Ulcer medicines. ? Muscle relaxants. ? Drugs.  Excessive drinking.  Psychological causes, such as: ? Anxiety. ? Depression. ? Sadness. ? Exhaustion. ? Performance fear. ? Stress.  Physical causes, such as: ? Artery problems. This may include diabetes, smoking, liver disease, or atherosclerosis. ? High blood pressure. ? Hormonal problems, such as low testosterone. ? Obesity. ? Nerve problems. This may include back or pelvic injuries, diabetes mellitus, multiple sclerosis, or Parkinson disease. What are the signs or symptoms? Symptoms of this condition include:  Inability to get an erection.  Lack of enough hardness of the erection to allow penetration.  Loss of the erection before sex is finished.  Normal erections at some times, but with frequent unsatisfactory episodes.  Low sexual satisfaction in either partner due to erection problems.  A curved penis occurring with erection. The curve may cause pain or the penis may be too curved to allow for intercourse.  Never having nighttime erections. How is this diagnosed? This condition is often diagnosed by:  Performing a physical exam to find other diseases or specific problems with the penis.  Asking you detailed questions about the problem.  Performing blood tests to check for diabetes mellitus or to measure hormone levels.  Performing other tests to check for underlying health conditions.  Performing an ultrasound  exam to check for scarring.  Performing a test to check blood flow to the penis.  Doing a sleep study at home to measure nighttime erections. How is this treated? This condition may be treated by:  Medicine taken by mouth to help you achieve an erection (oral medicine).  Hormone replacement therapy to replace low testosterone levels.  Medicine that is injected into the penis. Your health care provider may instruct you how to give yourself these injections at home.  Vacuum pump. This is a pump with a ring on it. The pump and ring are placed on the penis and used to create pressure that helps the penis become erect.  Penile implant surgery. In this procedure, you may receive: ? An inflatable implant. This consists of cylinders, a pump, and a reservoir. The cylinders can be inflated with a fluid that helps to create an erection, and they can be deflated after intercourse. ? A semi-rigid implant. This consists of two silicone rubber rods. The rods provide some rigidity. They are also flexible, so the penis can both curve downward in its normal position and become straight for sexual intercourse.  Blood vessel surgery, to improve blood flow to the penis. During this procedure, a blood vessel from a different part of the body is placed into the penis to allow blood to flow around (bypass) damaged or blocked blood vessels.  Lifestyle changes, such as exercising more, losing weight, and quitting smoking. Follow these instructions at home: Medicines   Take over-the-counter and prescription medicines only as told by your health care provider. Do not increase the dosage without first discussing it with your health care provider.  If you are using self-injections, perform injections as directed by your   health care provider. Make sure to avoid any veins that are on the surface of the penis. After giving an injection, apply pressure to the injection site for 5 minutes. General instructions   Exercise regularly, as directed by your health care provider. Work with your health care provider to lose weight, if needed.  Do not use any products that contain nicotine or tobacco, such as cigarettes and e-cigarettes. If you need help quitting, ask your health care provider.  Before using a vacuum pump, read the instructions that come with the pump and discuss any questions with your health care provider.  Keep all follow-up visits as told by your health care provider. This is important. Contact a health care provider if:  You feel nauseous.  You vomit. Get help right away if:  You are taking oral or injectable medicines and you have an erection that lasts longer than 4 hours. If your health care provider is unavailable, go to the nearest emergency room for evaluation. An erection that lasts much longer than 4 hours can result in permanent damage to your penis.  You have severe pain in your groin or abdomen.  You develop redness or severe swelling of your penis.  You have redness spreading up into your groin or lower abdomen.  You are unable to urinate.  You experience chest pain or a rapid heart beat (palpitations) after taking oral medicines. Summary  Erectile dysfunction (ED) is the inability to get or keep an erection during sexual intercourse. This problem can usually be treated successfully.  This condition is diagnosed based on a physical exam, your symptoms, and tests to determine the cause. Treatment varies depending on the cause, and may include medicines, hormone therapy, surgery, or vacuum pump.  You may need follow-up visits to make sure that you are using your medicines or devices correctly.  Get help right away if you are taking or injecting medicines and you have an erection that lasts longer than 4 hours. This information is not intended to replace advice given to you by your health care provider. Make sure you discuss any questions you have with your health care  provider. Document Released: 03/10/2000 Document Revised: 02/23/2017 Document Reviewed: 03/29/2016 Elsevier Patient Education  2020 [employer]  

## 2018-10-10 NOTE — Progress Notes (Signed)
10/10/18 11:29 AM   Michael Schmitt 16-Apr-1970 [id number]  Referring provider: [facility] [address],  [place] [address]  CC: Erectile dysfunction  HPI: I saw Michael Schmitt in urology clinic today for evaluation of erectile dysfunction and low testosterone.  He is a 48 year old male with numerous comorbidities including CAD on anticoagulation, diabetes, substance abuse, extensive psychiatric history including anxiety, depression, and suicidal ideation.  He was recently seen in the ED on 09/24/2018 with extreme agitation and suicidal ideation and was ultimately escorted out by police.  He has a history of CAD and multiple cardiac stents, but denies any recent chest pain and has never needed to use nitrates for chest pain.  He reports a multiyear history of erectile dysfunction, and is never tried any medications for this.  He is currently single and minimally bothered by his ED.  A testosterone was recently checked by his PCP and was mildly low at 214.  He denies any gross hematuria or urinary symptoms.  Severity is mild.  He denies any chest pain or shortness of breath with climbing 2 flights of stairs.  PMH: Past Medical History:  Diagnosis Date  . Anxiety   . Anxiety   . Arthritis    cerv. stenosis, spondylosis, HNP- lower back , has been followed in pain clinic, has  had injection s in cerv. area  . Blood dyscrasia    told that when he was young he was a" free bleeder"  . CAD (coronary artery disease)   . Cervical spondylosis without myelopathy 07/24/2014  . Cervicogenic headache 07/24/2014  . Chronic kidney disease    renal calculi- passed spontaneously  . Depression   . Diabetes mellitus without complication ([place])   . Fatty liver   . GERD (gastroesophageal reflux disease)   . Headache(784.0)   . Hyperlipidemia   . Hypertension   . Mental disorder   . MI, old   . RLS (restless legs syndrome)    detected on sleep study  . Shortness of breath   .  Ventricular fibrillation ([place]) 04/05/2016    Surgical History: Past Surgical History:  Procedure Laterality Date  . ANTERIOR CERVICAL DECOMP/DISCECTOMY FUSION  11/13/2011   Procedure: ANTERIOR CERVICAL DECOMPRESSION/DISCECTOMY FUSION 1 LEVEL;  Surgeon: Michael Hoops, MD;  Location: [facility];  Service: Neurosurgery;  Laterality: N/A;  Anterior Cervical Decompression/discectomy Fusion. Cervical three-four.  Marland Kitchen CARDIAC CATHETERIZATION N/A 01/01/2015   Procedure: Left Heart Cath and Coronary Angiography;  Surgeon: Michael Forward, MD;  Location: [facility];  Service: Cardiovascular;  Laterality: N/A;  . CARDIAC CATHETERIZATION N/A 04/05/2016   Procedure: Left Heart Cath and Coronary Angiography;  Surgeon: Michael Crome, MD;  Location: [facility];  Service: Cardiovascular;  Laterality: N/A;  . CARDIAC CATHETERIZATION N/A 04/05/2016   Procedure: Coronary Stent Intervention;  Surgeon: Michael Crome, MD;  Location: [facility];  Service: Cardiovascular;  Laterality: N/A;  . CARDIAC CATHETERIZATION N/A 04/05/2016   Procedure: Intravascular Ultrasound/IVUS;  Surgeon: Michael Crome, MD;  Location: [facility];  Service: Cardiovascular;  Laterality: N/A;  . NASAL SINUS SURGERY     2005   Allergies:  Allergies  Allergen Reactions  . Prednisone Other (See Comments)    Hypertension and makes him feel "spacey"  . Amoxicillin Other (See Comments)    Other Reaction: ELEVATED BP  . Hydrocodone Other (See Comments)    Causes headaches and makes the patient irritable  .  Varenicline Other (See Comments)    Suicidal thoughts  . Trazodone And Nefazodone Anxiety and Other (See Comments)    Patient stated that this keeps him awake; "has an opposite effect on me"    Family History: Family History  Problem Relation Age of Onset  . Prostate cancer Father   . Hypertension Mother   . Kidney Stones Mother   . Anxiety disorder Mother   . Depression Mother   . COPD Sister   .  Hypertension Sister   . Diabetes Sister   . Depression Sister   . Anxiety disorder Sister   . Seizures Sister   . ADD / ADHD Son   . ADD / ADHD Daughter     Social History:  reports that he has been smoking cigarettes and cigars. He has a 34.00 pack-year smoking history. He has quit using smokeless tobacco. He reports that he does not drink alcohol or use drugs.  ROS: Please see flowsheet from today's date for complete review of systems.  Physical Exam: BP 127/87   Pulse (!) 103   Ht 6\' 2"  (1.88 m)   Wt 208 lb (94.3 kg)   BMI 26.71 kg/m    Constitutional:  Alert and oriented, No acute distress. Cardiovascular: No clubbing, cyanosis, or edema. Respiratory: Normal respiratory effort, no increased work of breathing. GI: Abdomen is soft, nontender, nondistended, no abdominal masses GU: No CVA tenderness Lymph: No cervical or inguinal lymphadenopathy. Skin: No rashes, bruises or suspicious lesions. Neurologic: Grossly intact, no focal deficits, moving all 4 extremities.  Laboratory Data: Reviewed, see HPI  Pertinent Imaging: None to review  Assessment & Plan:   In summary, the patient is a 48 year old male with extensive cardiac and psychiatric history who is referred for low testosterone and erectile dysfunction.  I did very long and frank conversation with the patient today with his psychiatric and cardiac history he would not be a good candidate for testosterone replacement therapy.  We also discussed the risks at length of PDE 5 inhibitors for erectile dysfunction and mixing these medications with nitrates for chest pain.  He reports he does not take nitrates for chest pain at all.  We discussed possible risks including an unsafe drop in blood pressure if these medications are taken together.  He would like a prescription for PDE 5 inhibitor in case he becomes sexually active in the future.  We discussed side effects including headache, congestion, muscle pain, and very low risk  of priapism.  Follow-up as needed  Michael Co, MD  [facility] [address] [place], [place] [address] [phone]

## 2018-10-15 ENCOUNTER — Other Ambulatory Visit: Payer: Self-pay | Admitting: Family Medicine

## 2018-10-15 DIAGNOSIS — E119 Type 2 diabetes mellitus without complications: Secondary | ICD-10-CM | POA: Diagnosis not present

## 2018-10-15 DIAGNOSIS — I10 Essential (primary) hypertension: Secondary | ICD-10-CM | POA: Diagnosis not present

## 2018-10-15 DIAGNOSIS — E782 Mixed hyperlipidemia: Secondary | ICD-10-CM | POA: Diagnosis not present

## 2018-10-15 MED ORDER — SILDENAFIL CITRATE 20 MG PO TABS: 20.0000 mg | ORAL_TABLET | Freq: Every day | ORAL | 0 refills | Status: DC | PRN

## 2018-10-18 ENCOUNTER — Telehealth: Payer: Self-pay

## 2018-10-18 NOTE — Telephone Encounter (Signed)
PA started by phone [phone]) for Sildenafil

## 2018-10-23 DIAGNOSIS — Z79899 Other long term (current) drug therapy: Secondary | ICD-10-CM | POA: Diagnosis not present

## 2018-10-24 ENCOUNTER — Emergency Department
Admission: EM | Admit: 2018-10-24 | Discharge: 2018-10-24 | Disposition: A | Discharge status: Home / Self Care | Payer: Medicare Other | Attending: Emergency Medicine | Admitting: Emergency Medicine

## 2018-10-24 DIAGNOSIS — R5381 Other malaise: Secondary | ICD-10-CM | POA: Diagnosis not present

## 2018-10-24 DIAGNOSIS — R079 Chest pain, unspecified: Secondary | ICD-10-CM | POA: Diagnosis not present

## 2018-10-24 DIAGNOSIS — I959 Hypotension, unspecified: Secondary | ICD-10-CM | POA: Diagnosis not present

## 2018-10-24 DIAGNOSIS — E1165 Type 2 diabetes mellitus with hyperglycemia: Secondary | ICD-10-CM | POA: Diagnosis not present

## 2018-10-24 DIAGNOSIS — R069 Unspecified abnormalities of breathing: Secondary | ICD-10-CM | POA: Diagnosis not present

## 2018-10-24 DIAGNOSIS — R0602 Shortness of breath: Secondary | ICD-10-CM | POA: Insufficient documentation

## 2018-10-24 DIAGNOSIS — R0689 Other abnormalities of breathing: Secondary | ICD-10-CM | POA: Diagnosis not present

## 2018-10-24 DIAGNOSIS — Z5321 Procedure and treatment not carried out due to patient leaving prior to being seen by health care provider: Secondary | ICD-10-CM | POA: Diagnosis not present

## 2018-10-24 DIAGNOSIS — E782 Mixed hyperlipidemia: Secondary | ICD-10-CM | POA: Diagnosis not present

## 2018-10-24 NOTE — ED Triage Notes (Signed)
Pt still sitting in [place], reports that he does not want to be seen and has called for a ride to come and get him.

## 2018-10-24 NOTE — ED Triage Notes (Signed)
Pt in via EMS from home with c/o CP and SOB for the last few weeks. Pt reported to EMS that today his heart is beating faster. Pt has appointment at 1:15 today but felt like he needed to come to the ED versus his MD.   102/64, 97.3 t. FSBS 278, 102-110 HR

## 2018-10-24 NOTE — ED Triage Notes (Signed)
Pt walked out of triage room stating "I am not waiting"

## 2018-10-25 DIAGNOSIS — I2510 Atherosclerotic heart disease of native coronary artery without angina pectoris: Secondary | ICD-10-CM | POA: Diagnosis not present

## 2018-10-25 DIAGNOSIS — E782 Mixed hyperlipidemia: Secondary | ICD-10-CM | POA: Diagnosis not present

## 2018-10-25 DIAGNOSIS — I10 Essential (primary) hypertension: Secondary | ICD-10-CM | POA: Diagnosis not present

## 2018-10-25 DIAGNOSIS — R030 Elevated blood-pressure reading, without diagnosis of hypertension: Secondary | ICD-10-CM | POA: Diagnosis not present

## 2018-10-25 DIAGNOSIS — R009 Unspecified abnormalities of heart beat: Secondary | ICD-10-CM | POA: Diagnosis not present

## 2018-11-03 ENCOUNTER — Emergency Department: Payer: Medicare Other

## 2018-11-03 ENCOUNTER — Other Ambulatory Visit: Payer: Self-pay

## 2018-11-03 ENCOUNTER — Emergency Department
Admission: EM | Admit: 2018-11-03 | Discharge: 2018-11-03 | Disposition: A | Discharge status: Home / Self Care | Payer: Medicare Other | Attending: Emergency Medicine | Admitting: Emergency Medicine

## 2018-11-03 ENCOUNTER — Encounter: Payer: Self-pay | Admitting: Intensive Care

## 2018-11-03 DIAGNOSIS — F17210 Nicotine dependence, cigarettes, uncomplicated: Secondary | ICD-10-CM | POA: Insufficient documentation

## 2018-11-03 DIAGNOSIS — J0110 Acute frontal sinusitis, unspecified: Secondary | ICD-10-CM | POA: Insufficient documentation

## 2018-11-03 DIAGNOSIS — I2510 Atherosclerotic heart disease of native coronary artery without angina pectoris: Secondary | ICD-10-CM | POA: Diagnosis not present

## 2018-11-03 DIAGNOSIS — Z79899 Other long term (current) drug therapy: Secondary | ICD-10-CM | POA: Diagnosis not present

## 2018-11-03 DIAGNOSIS — E119 Type 2 diabetes mellitus without complications: Secondary | ICD-10-CM | POA: Insufficient documentation

## 2018-11-03 DIAGNOSIS — Z7902 Long term (current) use of antithrombotics/antiplatelets: Secondary | ICD-10-CM | POA: Diagnosis not present

## 2018-11-03 DIAGNOSIS — I252 Old myocardial infarction: Secondary | ICD-10-CM | POA: Insufficient documentation

## 2018-11-03 DIAGNOSIS — J321 Chronic frontal sinusitis: Secondary | ICD-10-CM | POA: Diagnosis not present

## 2018-11-03 DIAGNOSIS — R51 Headache: Secondary | ICD-10-CM | POA: Diagnosis not present

## 2018-11-03 DIAGNOSIS — Z7982 Long term (current) use of aspirin: Secondary | ICD-10-CM | POA: Insufficient documentation

## 2018-11-03 DIAGNOSIS — I10 Essential (primary) hypertension: Secondary | ICD-10-CM | POA: Insufficient documentation

## 2018-11-03 DIAGNOSIS — R519 Headache, unspecified: Secondary | ICD-10-CM

## 2018-11-03 DIAGNOSIS — Z7984 Long term (current) use of oral hypoglycemic drugs: Secondary | ICD-10-CM | POA: Diagnosis not present

## 2018-11-03 DIAGNOSIS — J45909 Unspecified asthma, uncomplicated: Secondary | ICD-10-CM | POA: Insufficient documentation

## 2018-11-03 MED ORDER — AMOXICILLIN-POT CLAVULANATE 875-125 MG PO TABS: 1.0000 | ORAL_TABLET | Freq: Two times a day (BID) | ORAL | 0 refills | Status: DC

## 2018-11-03 MED ORDER — SODIUM CHLORIDE 0.9 % IV BOLUS
500.0000 mL | Freq: Once | INTRAVENOUS | Status: AC
  Administered 2018-11-03: 13:00:00 500 mL via INTRAVENOUS

## 2018-11-03 MED ORDER — TRAMADOL HCL 50 MG PO TABS: 50.0000 mg | ORAL_TABLET | Freq: Three times a day (TID) | ORAL | 0 refills | Status: AC | PRN

## 2018-11-03 MED ORDER — KETOROLAC TROMETHAMINE 30 MG/ML IJ SOLN
30.0000 mg | Freq: Once | INTRAMUSCULAR | Status: AC
  Administered 2018-11-03: 30 mg via INTRAVENOUS
  Filled 2018-11-03: qty 1

## 2018-11-03 MED ORDER — METOCLOPRAMIDE HCL 5 MG/ML IJ SOLN
10.0000 mg | Freq: Once | INTRAMUSCULAR | Status: AC
  Administered 2018-11-03: 10 mg via INTRAVENOUS
  Filled 2018-11-03: qty 2

## 2018-11-03 MED ORDER — ORPHENADRINE CITRATE 30 MG/ML IJ SOLN
60.0000 mg | INTRAMUSCULAR | Status: AC
  Administered 2018-11-03: 60 mg via INTRAMUSCULAR
  Filled 2018-11-03: qty 2

## 2018-11-03 MED ORDER — KETOROLAC TROMETHAMINE 10 MG PO TABS: 10.0000 mg | ORAL_TABLET | Freq: Three times a day (TID) | ORAL | 0 refills | Status: DC

## 2018-11-03 MED ORDER — DIPHENHYDRAMINE HCL 50 MG/ML IJ SOLN
25.0000 mg | Freq: Once | INTRAMUSCULAR | Status: AC
  Administered 2018-11-03: 13:00:00 25 mg via INTRAVENOUS
  Filled 2018-11-03: qty 1

## 2018-11-03 NOTE — ED Provider Notes (Signed)
[facility] Emergency Department Provider Note ____________________________________________  Time seen: 1225  I have reviewed the triage vital signs and the nursing notes.  HISTORY  Chief Complaint  Headache  HPI Michael Schmitt is a 48 y.o. male presents to the ED for evaluation of persistent headache for the last 2 weeks.  Patient describes a frontal headache that he describes as tight and sharp. He notes blurry vision but denies vertigo, dizziness, photosensitivity or syncope. He has vomited (non-bloody/non-bilious) twice in the last 2 weeks, but blames the [employer] powder chased with soda. He denies fevers, runny nose, cough or SOB. He does note some sinus congestion. He rates his current pain at 10/10. He gives a history of posterior headaches at baseline.   Past Medical History:  Diagnosis Date  . Anxiety   . Anxiety   . Arthritis    cerv. stenosis, spondylosis, HNP- lower back , has been followed in pain clinic, has  had injection s in cerv. area  . Blood dyscrasia    told that when he was young he was a" free bleeder"  . CAD (coronary artery disease)   . Cervical spondylosis without myelopathy 07/24/2014  . Cervicogenic headache 07/24/2014  . Chronic kidney disease    renal calculi- passed spontaneously  . Depression   . Diabetes mellitus without complication ([place])   . Fatty liver   . GERD (gastroesophageal reflux disease)   . Headache(784.0)   . Hyperlipidemia   . Hypertension   . Mental disorder   . MI, old   . RLS (restless legs syndrome)    detected on sleep study  . Shortness of breath   . Ventricular fibrillation ([place]) 04/05/2016    Patient Active Problem List   Diagnosis Date Noted  . Alcohol abuse with alcohol-induced mood disorder ([place]) 09/21/2018  . Overdose 05/17/2018  . Depression, major, recurrent, severe with psychosis ([place]) 03/30/2018  . Personality disorder ([place]) 12/31/2017  . MDD (major depressive disorder), recurrent  episode, severe ([place]) 11/20/2017  . Dysthymia 10/26/2017  . Severe recurrent major depression without psychotic features ([place]) 03/12/2017  . Generalized anxiety disorder 02/05/2017  . Chronic neck pain (Primary Area of Pain) (Left) 11/22/2016  . Cervical facet syndrome (Left) 11/22/2016  . Cervical radiculitis (Left) 11/22/2016  . History of cervical spinal surgery 11/22/2016  . Cervical spondylosis 11/22/2016  . Chronic shoulder pain (Secondary Area of Pain) (Left) 11/22/2016  . Arthropathy of shoulder (Left) 11/22/2016  . Chronic low back pain [facility] Area of Pain) (Bilateral) (L>R) 11/22/2016  . Lumbar facet syndrome (Bilateral) (L>R) 11/22/2016  . Chronic pain syndrome 11/21/2016  . Long term prescription benzodiazepine use 11/21/2016  . Opiate use 11/21/2016  . Uncontrolled type 2 diabetes mellitus without complication, without long-term current use of insulin ([place]) 10/19/2016  . Chronic Suicidal ideation 09/15/2016  . Coronary artery disease 09/12/2016  . HTN (hypertension) 06/26/2016  . GERD (gastroesophageal reflux disease) 06/26/2016  . Alcohol use disorder, severe, 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[place]) 06/26/2016  . Benzodiazepine abuse ([place]) 06/11/2016  . Sedative, hypnotic or anxiolytic use disorder, severe, dependence ([place]) 05/12/2016  . Ventricular fibrillation ([place]) 04/05/2016  . Combined hyperlipidemia 01/05/2016  . Cervical spondylosis without myelopathy 07/24/2014  . Severe episode of recurrent major depressive disorder, without psychotic features ([place]) 04/15/2013  . Tobacco use disorder 10/20/2010  . Asthma 10/20/2010    Past Surgical History:  Procedure Laterality Date  . ANTERIOR CERVICAL DECOMP/DISCECTOMY FUSION  11/13/2011   Procedure: ANTERIOR CERVICAL DECOMPRESSION/DISCECTOMY FUSION 1 LEVEL;  Surgeon: [name], MD;  Location: [facility];  Service: Neurosurgery;  Laterality: N/A;  Anterior Cervical Decompression/discectomy Fusion. Cervical three-four.  Marland Kitchen CARDIAC  CATHETERIZATION N/A 01/01/2015   Procedure: Left Heart Cath and Coronary Angiography;  Surgeon: [name], MD;  Location: [facility];  Service: Cardiovascular;  Laterality: N/A;  . CARDIAC CATHETERIZATION N/A 04/05/2016   Procedure: Left Heart Cath and Coronary Angiography;  Surgeon: Belva Crome, MD;  Location: [facility];  Service: Cardiovascular;  Laterality: N/A;  . CARDIAC CATHETERIZATION N/A 04/05/2016   Procedure: Coronary Stent Intervention;  Surgeon: Belva Crome, MD;  Location: [facility];  Service: Cardiovascular;  Laterality: N/A;  . CARDIAC CATHETERIZATION N/A 04/05/2016   Procedure: Intravascular Ultrasound/IVUS;  Surgeon: Belva Crome, MD;  Location: [facility];  Service: Cardiovascular;  Laterality: N/A;  . NASAL SINUS SURGERY     2005    Prior to Admission medications   Medication Sig Start Date End Date Taking? Authorizing Provider  albuterol (PROVENTIL HFA;VENTOLIN HFA) 108 (90 Base) MCG/ACT inhaler Inhale 2 puffs into the lungs every 6 (six) hours as needed for wheezing. 04/11/18 04/11/19  [provider]  amoxicillin-clavulanate (AUGMENTIN) 875-125 MG tablet Take 1 tablet by mouth 2 (two) times daily. 11/03/18   Astou Lada, Dannielle Karvonen, PA-C  aspirin 81 MG EC tablet Take 1 tablet (81 mg total) by mouth daily. For heart health 11/21/17   Lindell Spar I, NP  atorvastatin (LIPITOR) 80 MG tablet Take 1 tablet (80 mg total) by mouth daily at 6 PM. For high cholesterol Patient taking differently: Take 80 mg by mouth daily. For high cholesterol 11/21/17   Lindell Spar I, NP  carvedilol (COREG) 6.25 MG tablet Take 1 tablet (6.25 mg total) by mouth 2 (two) times daily with a meal. For high blood pressure 11/21/17   Lindell Spar I, NP  clopidogrel (PLAVIX) 75 MG tablet Take 75 mg by mouth daily. 03/12/18 03/12/19  [provider]  gabapentin (NEURONTIN) 300 MG capsule Take 3 capsules (900 mg total) by mouth 3 (three) times daily. 04/01/18    Clapacs, Madie Reno, MD  ibuprofen (ADVIL,MOTRIN) 800 MG tablet Take 2,400 mg by mouth 3 (three) times daily as needed.    [provider]  ketorolac (TORADOL) 10 MG tablet Take 1 tablet (10 mg total) by mouth every 8 (eight) hours. 11/03/18   Seylah Wernert, Dannielle Karvonen, PA-C  lisinopril (PRINIVIL,ZESTRIL) 5 MG tablet Take 5 mg by mouth daily. 03/12/18 03/12/19  [provider]  metFORMIN (GLUCOPHAGE) 500 MG tablet Take 1 tablet (500 mg total) by mouth 2 (two) times daily with a meal. For diabetes management 11/21/17   Lindell Spar I, NP  nitroGLYCERIN (NITROSTAT) 0.4 MG SL tablet Place 1 tablet (0.4 mg total) under the tongue every 5 (five) minutes x 3 doses as needed. For chest pain 11/21/17 11/21/18  Lindell Spar I, NP  sildenafil (REVATIO) 20 MG tablet Take 1 tablet (20 mg total) by mouth daily as needed. 10/15/18   Billey Co, MD  traMADol (ULTRAM) 50 MG tablet Take 1 tablet (50 mg total) by mouth 3 (three) times daily as needed for up to 3 days. 11/03/18 11/06/18  Brittnee Gaetano, Dannielle Karvonen, PA-C    Allergies Prednisone, Amoxicillin, Hydrocodone, Varenicline, and Trazodone and nefazodone  Family History  Problem Relation Age of Onset  . Prostate cancer Father   . Hypertension Mother   . Kidney Stones Mother   .  Anxiety disorder Mother   . Depression Mother   . COPD Sister   . Hypertension Sister   . Diabetes Sister   . Depression Sister   . Anxiety disorder Sister   . Seizures Sister   . ADD / ADHD Son   . ADD / ADHD Daughter     Social History Social History   Tobacco Use  . Smoking status: Current Every Day Smoker    Packs/day: 2.00    Years: 17.00    Pack years: 34.00    Types: Cigarettes, Cigars  . Smokeless tobacco: Former Systems developer  . Tobacco comment: occassional snuff   Substance Use Topics  . Alcohol use: No    Alcohol/week: 0.0 standard drinks    Comment: last drinked in 3 months.   . Drug use: No    Review of Systems  Constitutional: Negative for  fever. Eyes: Negative for visual changes. ENT: Negative for sore throat. Cardiovascular: Negative for chest pain. Respiratory: Negative for shortness of breath. Gastrointestinal: Negative for abdominal pain, vomiting and diarrhea. Genitourinary: Negative for dysuria. Musculoskeletal: Negative for back pain. Skin: Negative for rash. Neurological: Negative for focal weakness or numbness. Reports headache as above ____________________________________________  PHYSICAL EXAM:  VITAL SIGNS: ED Triage Vitals  Enc Vitals Group     BP 11/03/18 1057 (!) 146/105     Pulse Rate 11/03/18 1057 90     Resp 11/03/18 1057 18     Temp 11/03/18 1057 98.3 F (36.8 C)     Temp Source 11/03/18 1057 Oral     SpO2 11/03/18 1057 97 %     Weight 11/03/18 1054 210 lb (95.3 kg)     Height 11/03/18 1054 6\' 2"  (1.88 m)     Head Circumference --      Peak Flow --      Pain Score 11/03/18 1054 10     Pain Loc --      Pain Edu? --      Excl. in [place]? --     Constitutional: Alert and oriented. Well appearing and in no distress. Head: Normocephalic and atraumatic. Eyes: Conjunctivae are normal. PERRL. Normal extraocular movements and fundi Ears: Canals clear. TMs intact bilaterally. Nose: No congestion/rhinorrhea/epistaxis. Mouth/Throat: Mucous membranes are moist. Neck: Supple. Normal ROM without crepitus or rigidity Hematological/Lymphatic/Immunological: No cervical lymphadenopathy. Cardiovascular: Normal rate, regular rhythm. Normal distal pulses. Respiratory: Normal respiratory effort. No wheezes/rales/rhonchi. Gastrointestinal: Soft and nontender. No distention. Musculoskeletal: Nontender with normal range of motion in all extremities.  Neurologic:  Normal gait without ataxia. Normal speech and language. No gross focal neurologic deficits are appreciated. Skin:  Skin is warm, dry and intact. No rash noted. ____________________________________________   RADIOLOGY  CT Head w/o CM  IMPRESSION: 1.  No acute intracranial abnormalities. The appearance of the brain is normal. 2. Mild mucosal thickening in the frontal sinuses. No air-fluid levels to clearly indicate an acute sinusitis at this time. ____________________________________________  PROCEDURES  Procedures Benadryl 25 mg IVP Metoclopramide 10 mg IVP Toradol 30 mg IVP Norflex 60 mg IVP NS 500 ml bolus ____________________________________________  INITIAL IMPRESSION / ASSESSMENT AND PLAN / ED COURSE  Amerigo Orbach was evaluated in Emergency Department on 11/03/2018 for the symptoms described in the history of present illness. He was evaluated in the context of the global COVID-19 pandemic, which necessitated consideration that the patient might be at risk for infection with the SARS-CoV-2 virus that causes COVID-19. Institutional protocols and algorithms that pertain to the evaluation of patients at  risk for COVID-19 are in a state of rapid change based on information released by regulatory bodies including the CDC and federal and state organizations. These policies and algorithms were followed during the patient's care in the ED.  Differential diagnosis includes, but is not limited to, intracranial hemorrhage, meningitis/encephalitis, previous head trauma, cavernous venous thrombosis, tension headache, temporal arteritis, migraine or migraine equivalent, idiopathic intracranial hypertension, and non-specific headache.  Patient with ED evaluation of a 2-week complaint of intermittent, persistent frontal headache.  Patient denies any interim fevers, chills, sweats.  His exam is overall benign reassuring at this time.  CT does not show any acute intracranial process.  He does have some frontal sinus wall thickening which may be indicative of a subacute infection.  Will be treated empirically with amoxicillin.  Prescriptions for ketorolac and Ultram (#6) are also provided for pain relief.  He will follow-up with the primary provider  or return to the ED as needed.  I reviewed the patient's prescription history over the last 12 months in the multi-state controlled substances database(s) that includes [place], [place], [place], [place], [place], [place], [place], [place], [place], [place], [place], [place], [place], and [place].  Results were notable for monthly BZD prescriptions.  ____________________________________________  FINAL CLINICAL IMPRESSION(S) / ED DIAGNOSES  Final diagnoses:  Frontal headache  Acute non-recurrent frontal sinusitis      Carmie End, Dannielle Karvonen, PA-C 11/03/18 1433    Arta Silence, MD 11/03/18 [phone]

## 2018-11-03 NOTE — ED Triage Notes (Signed)
Patient c/o new onset headache X2 weeks.

## 2018-11-03 NOTE — Discharge Instructions (Signed)
Take the prescription meds as directed. Follow-up with your primary provider for ongoing symptoms. Return to the ED as needed.

## 2018-11-04 ENCOUNTER — Emergency Department
Admission: EM | Admit: 2018-11-04 | Discharge: 2018-11-05 | Disposition: A | Discharge status: Home / Self Care | Payer: Medicare Other | Attending: Emergency Medicine | Admitting: Emergency Medicine

## 2018-11-04 DIAGNOSIS — E119 Type 2 diabetes mellitus without complications: Secondary | ICD-10-CM | POA: Diagnosis not present

## 2018-11-04 DIAGNOSIS — Z7984 Long term (current) use of oral hypoglycemic drugs: Secondary | ICD-10-CM | POA: Insufficient documentation

## 2018-11-04 DIAGNOSIS — I252 Old myocardial infarction: Secondary | ICD-10-CM | POA: Insufficient documentation

## 2018-11-04 DIAGNOSIS — R45851 Suicidal ideations: Secondary | ICD-10-CM | POA: Diagnosis not present

## 2018-11-04 DIAGNOSIS — Z7902 Long term (current) use of antithrombotics/antiplatelets: Secondary | ICD-10-CM | POA: Diagnosis not present

## 2018-11-04 DIAGNOSIS — I2510 Atherosclerotic heart disease of native coronary artery without angina pectoris: Secondary | ICD-10-CM | POA: Diagnosis not present

## 2018-11-04 DIAGNOSIS — G43009 Migraine without aura, not intractable, without status migrainosus: Secondary | ICD-10-CM | POA: Diagnosis not present

## 2018-11-04 DIAGNOSIS — I129 Hypertensive chronic kidney disease with stage 1 through stage 4 chronic kidney disease, or unspecified chronic kidney disease: Secondary | ICD-10-CM | POA: Diagnosis not present

## 2018-11-04 DIAGNOSIS — J45909 Unspecified asthma, uncomplicated: Secondary | ICD-10-CM | POA: Insufficient documentation

## 2018-11-04 DIAGNOSIS — N189 Chronic kidney disease, unspecified: Secondary | ICD-10-CM | POA: Diagnosis not present

## 2018-11-04 DIAGNOSIS — R000 Tachycardia, unspecified: Secondary | ICD-10-CM | POA: Diagnosis not present

## 2018-11-04 DIAGNOSIS — Z03818 Encounter for observation for suspected exposure to other biological agents ruled out: Secondary | ICD-10-CM | POA: Diagnosis not present

## 2018-11-04 DIAGNOSIS — Z7982 Long term (current) use of aspirin: Secondary | ICD-10-CM | POA: Diagnosis not present

## 2018-11-04 DIAGNOSIS — G43909 Migraine, unspecified, not intractable, without status migrainosus: Secondary | ICD-10-CM | POA: Diagnosis not present

## 2018-11-04 DIAGNOSIS — R51 Headache: Secondary | ICD-10-CM | POA: Insufficient documentation

## 2018-11-04 DIAGNOSIS — F17290 Nicotine dependence, other tobacco product, uncomplicated: Secondary | ICD-10-CM | POA: Diagnosis not present

## 2018-11-04 DIAGNOSIS — F17210 Nicotine dependence, cigarettes, uncomplicated: Secondary | ICD-10-CM | POA: Insufficient documentation

## 2018-11-04 DIAGNOSIS — Z880 Allergy status to penicillin: Secondary | ICD-10-CM | POA: Diagnosis not present

## 2018-11-04 DIAGNOSIS — Z955 Presence of coronary angioplasty implant and graft: Secondary | ICD-10-CM | POA: Diagnosis not present

## 2018-11-04 DIAGNOSIS — R112 Nausea with vomiting, unspecified: Secondary | ICD-10-CM | POA: Diagnosis not present

## 2018-11-04 DIAGNOSIS — E1122 Type 2 diabetes mellitus with diabetic chronic kidney disease: Secondary | ICD-10-CM | POA: Diagnosis not present

## 2018-11-04 DIAGNOSIS — R0902 Hypoxemia: Secondary | ICD-10-CM | POA: Diagnosis not present

## 2018-11-04 DIAGNOSIS — Z20828 Contact with and (suspected) exposure to other viral communicable diseases: Secondary | ICD-10-CM | POA: Diagnosis not present

## 2018-11-04 DIAGNOSIS — Z79899 Other long term (current) drug therapy: Secondary | ICD-10-CM | POA: Diagnosis not present

## 2018-11-04 DIAGNOSIS — Z823 Family history of stroke: Secondary | ICD-10-CM | POA: Diagnosis not present

## 2018-11-04 DIAGNOSIS — Z825 Family history of asthma and other chronic lower respiratory diseases: Secondary | ICD-10-CM | POA: Diagnosis not present

## 2018-11-04 DIAGNOSIS — R0981 Nasal congestion: Secondary | ICD-10-CM | POA: Diagnosis not present

## 2018-11-04 LAB — CBC
HCT: 45.1 % (ref 39.0–52.0)
Hemoglobin: 15.3 g/dL (ref 13.0–17.0)
MCH: 31.8 pg (ref 26.0–34.0)
MCHC: 33.9 g/dL (ref 30.0–36.0)
MCV: 93.8 fL (ref 80.0–100.0)
Platelets: 200 10*3/uL (ref 150–400)
RBC: 4.81 MIL/uL (ref 4.22–5.81)
RDW: 12.7 % (ref 11.5–15.5)
WBC: 10.9 10*3/uL — ABNORMAL HIGH (ref 4.0–10.5)
nRBC: 0 % (ref 0.0–0.2)

## 2018-11-04 MED ORDER — DIPHENHYDRAMINE HCL 50 MG/ML IJ SOLN
50.0000 mg | Freq: Once | INTRAMUSCULAR | Status: AC
  Administered 2018-11-04: 50 mg via INTRAMUSCULAR

## 2018-11-04 MED ORDER — HALOPERIDOL LACTATE 5 MG/ML IJ SOLN
INTRAMUSCULAR | Status: AC
  Administered 2018-11-04: 5 mg via INTRAMUSCULAR
  Filled 2018-11-04: qty 1

## 2018-11-04 MED ORDER — HALOPERIDOL LACTATE 5 MG/ML IJ SOLN
5.0000 mg | Freq: Once | INTRAMUSCULAR | Status: AC
  Administered 2018-11-04: 5 mg via INTRAMUSCULAR

## 2018-11-04 MED ORDER — LORAZEPAM 2 MG/ML IJ SOLN
INTRAMUSCULAR | Status: AC
  Filled 2018-11-04: qty 1

## 2018-11-04 MED ORDER — DIPHENHYDRAMINE HCL 50 MG/ML IJ SOLN
INTRAMUSCULAR | Status: AC
  Administered 2018-11-04: 50 mg via INTRAMUSCULAR
  Filled 2018-11-04: qty 1

## 2018-11-04 NOTE — ED Provider Notes (Addendum)
[facility] Emergency Department Provider Note  ____________________________________________   First MD Initiated Contact with Patient 11/04/18 2319     (approximate)  I have reviewed the triage vital signs and the nursing notes.   HISTORY  Chief Complaint Psychiatric Evaluation    HPI Michael Schmitt is a 48 y.o. male with CKD, coronary disease who presents with SI and attempted to hurt himself.  Patient was brought in by EMS and is actively trying to hurt himself.  Patient says he has been feeling suicidal for a while.  Denies a plan except for he is actively trying to hurt himself by slapping himself in the face.  This is severe, constant, nothing makes better, nothing makes it worse.  Patient is requesting a shot of Ativan.  Patient says he is taking Ativan at home 3 times a day however is not listed on his medical sheet.  He denies auditory or visual hallucinations.  Denies alcohol or drug use.  Patient says he is been off of his medications for over a week. Pt does endorse headache as well and has been hitting himself.      Past Medical History:  Diagnosis Date  . Anxiety   . Anxiety   . Arthritis    cerv. stenosis, spondylosis, HNP- lower back , has been followed in pain clinic, has  had injection s in cerv. area  . Blood dyscrasia    told that when he was young he was a" free bleeder"  . CAD (coronary artery disease)   . Cervical spondylosis without myelopathy 07/24/2014  . Cervicogenic headache 07/24/2014  . Chronic kidney disease    renal calculi- passed spontaneously  . Depression   . Diabetes mellitus without complication ([place])   . Fatty liver   . GERD (gastroesophageal reflux disease)   . Headache(784.0)   . Hyperlipidemia   . Hypertension   . Mental disorder   . MI, old   . RLS (restless legs syndrome)    detected on sleep study  . Shortness of breath   . Ventricular fibrillation ([place]) 04/05/2016    Patient Active Problem  List   Diagnosis Date Noted  . Alcohol abuse with alcohol-induced mood disorder ([place]) 09/21/2018  . Overdose 05/17/2018  . Depression, major, recurrent, severe with psychosis ([place]) 03/30/2018  . Personality disorder ([place]) 12/31/2017  . MDD (major depressive disorder), recurrent episode, severe ([place]) 11/20/2017  . Dysthymia 10/26/2017  . Severe recurrent major depression without psychotic features ([place]) 03/12/2017  . Generalized anxiety disorder 02/05/2017  . Chronic neck pain (Primary Area of Pain) (Left) 11/22/2016  . Cervical facet syndrome (Left) 11/22/2016  . Cervical radiculitis (Left) 11/22/2016  . History of cervical spinal surgery 11/22/2016  . Cervical spondylosis 11/22/2016  . Chronic shoulder pain (Secondary Area of Pain) (Left) 11/22/2016  . Arthropathy of shoulder (Left) 11/22/2016  . Chronic low back pain [facility] Area of Pain) (Bilateral) (L>R) 11/22/2016  . Lumbar facet syndrome (Bilateral) (L>R) 11/22/2016  . Chronic pain syndrome 11/21/2016  . Long term prescription benzodiazepine use 11/21/2016  . Opiate use 11/21/2016  . Uncontrolled type 2 diabetes mellitus without complication, without long-term current use of insulin ([place]) 10/19/2016  . Chronic Suicidal ideation 09/15/2016  . Coronary artery disease 09/12/2016  . HTN (hypertension) 06/26/2016  . GERD (gastroesophageal reflux disease) 06/26/2016  . Alcohol use disorder, severe, 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[place]) 06/26/2016  . Benzodiazepine abuse ([place]) 06/11/2016  . Sedative, hypnotic or anxiolytic use disorder, severe, dependence ([place]) 05/12/2016  .  Ventricular fibrillation ([place]) 04/05/2016  . Combined hyperlipidemia 01/05/2016  . Cervical spondylosis without myelopathy 07/24/2014  . Severe episode of recurrent major depressive disorder, without psychotic features ([place]) 04/15/2013  . Tobacco use disorder 10/20/2010  . Asthma 10/20/2010    Past Surgical History:  Procedure Laterality Date  . ANTERIOR CERVICAL  DECOMP/DISCECTOMY FUSION  11/13/2011   Procedure: ANTERIOR CERVICAL DECOMPRESSION/DISCECTOMY FUSION 1 LEVEL;  Surgeon: [name], MD;  Location: [employer] NEURO ORS;  Service: Neurosurgery;  Laterality: N/A;  Anterior Cervical Decompression/discectomy Fusion. Cervical three-four.  Marland Kitchen CARDIAC CATHETERIZATION N/A 01/01/2015   Procedure: Left Heart Cath and Coronary Angiography;  Surgeon: [name], MD;  Location: [place] CV LAB;  Service: Cardiovascular;  Laterality: N/A;  . CARDIAC CATHETERIZATION N/A 04/05/2016   Procedure: Left Heart Cath and Coronary Angiography;  Surgeon: Belva Crome, MD;  Location: [place] CV LAB;  Service: Cardiovascular;  Laterality: N/A;  . CARDIAC CATHETERIZATION N/A 04/05/2016   Procedure: Coronary Stent Intervention;  Surgeon: Belva Crome, MD;  Location: [place] CV LAB;  Service: Cardiovascular;  Laterality: N/A;  . CARDIAC CATHETERIZATION N/A 04/05/2016   Procedure: Intravascular Ultrasound/IVUS;  Surgeon: Belva Crome, MD;  Location: [place] CV LAB;  Service: Cardiovascular;  Laterality: N/A;  . NASAL SINUS SURGERY     2005    Prior to Admission medications   Medication Sig Start Date End Date Taking? Authorizing Provider  albuterol (PROVENTIL HFA;VENTOLIN HFA) 108 (90 Base) MCG/ACT inhaler Inhale 2 puffs into the lungs every 6 (six) hours as needed for wheezing. 04/11/18 04/11/19  [provider]  amoxicillin-clavulanate (AUGMENTIN) 875-125 MG tablet Take 1 tablet by mouth 2 (two) times daily. 11/03/18   Menshew, Dannielle Karvonen, PA-C  aspirin 81 MG EC tablet Take 1 tablet (81 mg total) by mouth daily. For heart health 11/21/17   Lindell Spar I, NP  atorvastatin (LIPITOR) 80 MG tablet Take 1 tablet (80 mg total) by mouth daily at 6 PM. For high cholesterol Patient taking differently: Take 80 mg by mouth daily. For high cholesterol 11/21/17   Lindell Spar I, NP  carvedilol (COREG) 6.25 MG tablet Take 1 tablet (6.25 mg total) by mouth 2 (two) times  daily with a meal. For high blood pressure 11/21/17   Lindell Spar I, NP  clopidogrel (PLAVIX) 75 MG tablet Take 75 mg by mouth daily. 03/12/18 03/12/19  [provider]  gabapentin (NEURONTIN) 300 MG capsule Take 3 capsules (900 mg total) by mouth 3 (three) times daily. 04/01/18   Clapacs, Madie Reno, MD  ibuprofen (ADVIL,MOTRIN) 800 MG tablet Take 2,400 mg by mouth 3 (three) times daily as needed.    [provider]  ketorolac (TORADOL) 10 MG tablet Take 1 tablet (10 mg total) by mouth every 8 (eight) hours. 11/03/18   Menshew, Dannielle Karvonen, PA-C  lisinopril (PRINIVIL,ZESTRIL) 5 MG tablet Take 5 mg by mouth daily. 03/12/18 03/12/19  [provider]  metFORMIN (GLUCOPHAGE) 500 MG tablet Take 1 tablet (500 mg total) by mouth 2 (two) times daily with a meal. For diabetes management 11/21/17   Lindell Spar I, NP  nitroGLYCERIN (NITROSTAT) 0.4 MG SL tablet Place 1 tablet (0.4 mg total) under the tongue every 5 (five) minutes x 3 doses as needed. For chest pain 11/21/17 11/21/18  Lindell Spar I, NP  sildenafil (REVATIO) 20 MG tablet Take 1 tablet (20 mg total) by mouth daily as needed. 10/15/18   Billey Co, MD  traMADol (ULTRAM) 50 MG  tablet Take 1 tablet (50 mg total) by mouth 3 (three) times daily as needed for up to 3 days. 11/03/18 11/06/18  Menshew, Dannielle Karvonen, PA-C    Allergies Prednisone, Amoxicillin, Hydrocodone, Varenicline, and Trazodone and nefazodone  Family History  Problem Relation Age of Onset  . Prostate cancer Father   . Hypertension Mother   . Kidney Stones Mother   . Anxiety disorder Mother   . Depression Mother   . COPD Sister   . Hypertension Sister   . Diabetes Sister   . Depression Sister   . Anxiety disorder Sister   . Seizures Sister   . ADD / ADHD Son   . ADD / ADHD Daughter     Social History Social History   Tobacco Use  . Smoking status: Current Every Day Smoker    Packs/day: 2.00    Years: 17.00    Pack years: 34.00    Types:  Cigarettes, Cigars  . Smokeless tobacco: Former Systems developer  . Tobacco comment: occassional snuff   Substance Use Topics  . Alcohol use: No    Alcohol/week: 0.0 standard drinks    Comment: last drinked in 3 months.   . Drug use: No      Review of Systems Constitutional: No fever/chills Eyes: No visual changes. ENT: No sore throat. Cardiovascular: Denies chest pain. Respiratory: Denies shortness of breath. Gastrointestinal: No abdominal pain.  No nausea, no vomiting.  No diarrhea.  No constipation. Genitourinary: Negative for dysuria. Musculoskeletal: Negative for back pain. Skin: Negative for rash. Neurological: Negative for headaches, focal weakness or numbness. Psych: Positive SI positive self-harm All other ROS negative ____________________________________________   PHYSICAL EXAM:  VITAL SIGNS: Blood pressure (!) 149/90, pulse 88, temperature 98.2 F (36.8 C), temperature source Oral, resp. rate 16, height 6\' 2"  (1.88 m), weight 95.3 kg, SpO2 97 %.   Constitutional: Alert and oriented.  Agitated Eyes: Conjunctivae are normal. EOMI. Head: Atraumatic. Nose: No congestion/rhinnorhea. Mouth/Throat: Mucous membranes are moist.   Neck: No stridor. Trachea Midline. FROM Cardiovascular: Normal rate, regular rhythm. Grossly normal heart sounds.  Good peripheral circulation. Respiratory: Normal respiratory effort.  No retractions. Lungs CTAB. Gastrointestinal: Soft and nontender. No distention. No abdominal bruits.  Musculoskeletal: No lower extremity tenderness nor edema.  No joint effusions. Neurologic:  Normal speech and language. No gross focal neurologic deficits are appreciated.  Skin:  Skin is warm, dry and intact. No rash noted. Psychiatric: Patient is endorsing SI and actively slapping himself. GU: Deferred   ____________________________________________   LABS (all labs ordered are listed, but only abnormal results are displayed)  Labs Reviewed  COMPREHENSIVE  METABOLIC PANEL - Abnormal; Notable for the following components:      Result Value   CO2 19 (*)    Glucose, Bld 192 (*)    Calcium 8.6 (*)    ALT 47 (*)    All other components within normal limits  ACETAMINOPHEN LEVEL - Abnormal; Notable for the following components:   Acetaminophen (Tylenol), Serum <10 (*)    All other components within normal limits  CBC - Abnormal; Notable for the following components:   WBC 10.9 (*)    All other components within normal limits  SARS CORONAVIRUS 2 (HOSPITAL ORDER, [place] LAB)  ETHANOL  SALICYLATE LEVEL  URINE DRUG SCREEN, QUALITATIVE ([facility] ONLY)   ____________________________________________  EKG my read is sinus rate of 87, no ST elevation, no T wave version, normal intervals  INITIAL IMPRESSION / ASSESSMENT AND PLAN /  ED COURSE  Loic Comolli was evaluated in Emergency Department on 11/04/2018 for the symptoms described in the history of present illness. He was evaluated in the context of the global COVID-19 pandemic, which necessitated consideration that the patient might be at risk for infection with the SARS-CoV-2 virus that causes COVID-19. Institutional protocols and algorithms that pertain to the evaluation of patients at risk for COVID-19 are in a state of rapid change based on information released by regulatory bodies including the CDC and federal and state organizations. These policies and algorithms were followed during the patient's care in the ED.     Patient is actively slapping himself and requesting Ativan.  I question if there is some malingering going on given he is very adamant about getting Ativan.  Although patient does continue to hurt himself will do IVC and give patient Benadryl and Haldol for now. Will get CT head to rule out ICH given pt has been trying to hurt himself.   Given another 5mg  Haldol.  EKG without evidence of prolong qtc.   CT head negative   Slightly elevated white count,  no other infectious symptoms. Glucose slightly elevated.    No exam findings to suggest medical cause of current presentation. Will order psychiatric screening labs and discuss further w/ psychiatric service.  D/d includes but is not limited to psychiatric disease, behavioral/personality disorder, inadequate socioeconomic support, medical.  Based on HPI, exam, unremarkable labs, no concern for acute medical problem at this time. No rigidity, clonus, hyperthermia, focal neurologic deficit, diaphoresis, tachycardia, meningismus, ataxia, gait abnormality or other finding to suggest this visit represents a non-psychiatric problem. Screening labs reviewed.    Given this, pt medically cleared, to be dispositioned per Psych.  D/w Psych and plan to admit downstairs.   ____________________________________________   FINAL CLINICAL IMPRESSION(S) / ED DIAGNOSES   Final diagnoses:  Suicidal ideation      MEDICATIONS GIVEN DURING THIS VISIT:  Medications  diphenhydrAMINE (BENADRYL) injection 50 mg (50 mg Intramuscular Given 11/04/18 2331)  haloperidol lactate (HALDOL) injection 5 mg (5 mg Intramuscular Given 11/04/18 2331)  haloperidol lactate (HALDOL) injection 5 mg (5 mg Intramuscular Given 11/05/18 0026)     ED Discharge Orders    None       Note:  This document was prepared using Dragon voice recognition software and may include unintentional dictation errors.      Vanessa Gilbert, MD 11/05/18 [phone]

## 2018-11-04 NOTE — ED Triage Notes (Signed)
Patient here with EMS for SI and "hitting himself". Patient asking for a "shot"

## 2018-11-05 ENCOUNTER — Emergency Department: Payer: Medicare Other

## 2018-11-05 ENCOUNTER — Emergency Department
Admission: EM | Admit: 2018-11-05 | Discharge: 2018-11-05 | Discharge status: Against Medical Advice | Payer: Medicare Other | Source: Home / Self Care

## 2018-11-05 DIAGNOSIS — R51 Headache: Secondary | ICD-10-CM | POA: Diagnosis not present

## 2018-11-05 LAB — COMPREHENSIVE METABOLIC PANEL
ALT: 47 U/L — ABNORMAL HIGH (ref 0–44)
AST: 37 U/L (ref 15–41)
Albumin: 4 g/dL (ref 3.5–5.0)
Alkaline Phosphatase: 110 U/L (ref 38–126)
Anion gap: 9 (ref 5–15)
BUN: 9 mg/dL (ref 6–20)
CO2: 19 mmol/L — ABNORMAL LOW (ref 22–32)
Calcium: 8.6 mg/dL — ABNORMAL LOW (ref 8.9–10.3)
Chloride: 109 mmol/L (ref 98–111)
Creatinine, Ser: 0.95 mg/dL (ref 0.61–1.24)
GFR calc Af Amer: >60 mL/min (ref >60)
GFR calc non Af Amer: >60 mL/min (ref >60)
Glucose, Bld: 192 mg/dL — ABNORMAL HIGH (ref 70–99)
Potassium: 3.7 mmol/L (ref 3.5–5.1)
Sodium: 137 mmol/L (ref 135–145)
Total Bilirubin: 0.5 mg/dL (ref 0.3–1.2)
Total Protein: 6.8 g/dL (ref 6.5–8.1)

## 2018-11-05 LAB — SALICYLATE LEVEL: Salicylate Lvl: <7 mg/dL (ref 2.8–30.0)

## 2018-11-05 LAB — ETHANOL: Alcohol, Ethyl (B): <10 mg/dL (ref <10)

## 2018-11-05 LAB — ACETAMINOPHEN LEVEL: Acetaminophen (Tylenol), Serum: <10 ug/mL — ABNORMAL LOW (ref 10–30)

## 2018-11-05 LAB — SARS CORONAVIRUS 2 BY RT PCR (HOSPITAL ORDER, PERFORMED IN ~~LOC~~ HOSPITAL LAB): SARS Coronavirus 2: NEGATIVE

## 2018-11-05 MED ORDER — CARVEDILOL 6.25 MG PO TABS
6.2500 mg | ORAL_TABLET | Freq: Two times a day (BID) | ORAL | Status: DC
  Administered 2018-11-05: 6.25 mg via ORAL
  Filled 2018-11-05: qty 1

## 2018-11-05 MED ORDER — LISINOPRIL 5 MG PO TABS
5.0000 mg | ORAL_TABLET | Freq: Every day | ORAL | Status: DC
  Administered 2018-11-05: 5 mg via ORAL
  Filled 2018-11-05: qty 1

## 2018-11-05 MED ORDER — HALOPERIDOL LACTATE 5 MG/ML IJ SOLN
5.0000 mg | Freq: Once | INTRAMUSCULAR | Status: AC
  Administered 2018-11-05: 5 mg via INTRAMUSCULAR
  Filled 2018-11-05: qty 1

## 2018-11-05 MED ORDER — ATORVASTATIN CALCIUM 20 MG PO TABS: 80.0000 mg | ORAL_TABLET | Freq: Every day | ORAL | Status: DC

## 2018-11-05 MED ORDER — METFORMIN HCL 500 MG PO TABS
500.0000 mg | ORAL_TABLET | Freq: Two times a day (BID) | ORAL | Status: DC
  Administered 2018-11-05: 500 mg via ORAL
  Filled 2018-11-05: qty 1

## 2018-11-05 NOTE — ED Notes (Signed)
Hourly rounding reveals patient sleeping in room. No complaints, stable, in no acute distress. Q15 minute rounds and monitoring via Rover and Officer to continue.  

## 2018-11-05 NOTE — ED Notes (Signed)
Patient to CT with officer and Tech.

## 2018-11-05 NOTE — ED Notes (Signed)
Hourly rounding reveals patient in room. Stable, in no acute distress. Q15 minute rounds and monitoring via Rover and Officer to continue.  

## 2018-11-05 NOTE — Consult Note (Addendum)
Patient reassessed this morning by Probation officer and case discussed with nursing staff. Patient reports improvement since he was able to rest and get his headache under control. When asked why he is no longer suicidal, he states he got relief of his headache. He admits to being suicidal prior to admission to the ED, however reports this was due to him being in pain. He reports that he is ready to go home and identifies several protective factors to include 2 children ( 11/14), and lives with his loved ones. He denies any substance abuse or legal charges at this time. He denies any suicidal ideation, homicidal ideation, and or hallucinations He reports his last suicide attempt was 7-8 years ago. Patient assessed by [facility] this morning. No new additional changes needed. Will discharge home at this time. Patient does not appear to be a threat to himself or others.

## 2018-11-05 NOTE — ED Notes (Signed)
Patient voices understanding of discharge instructions, no signs of distress, called His family to pick him up to go home, all belongings given to Patient, no complaints, number for SI hotline given to Patient.

## 2018-11-05 NOTE — ED Notes (Signed)
Patient sleeping, intermittently yelling out Terete like.

## 2018-11-05 NOTE — ED Notes (Signed)
TTS and Psych NP talked with Patient, He is calm and cooperative, Patient is going to be discharged home.

## 2018-11-05 NOTE — ED Notes (Signed)
TTS consulted with the pts nurse to reevaluate pts appropriateness for psychiatric consult. Pt is somnolent at this time. Pts nurse has agreed to call to arrange telepsyh assessment when pt is alert.

## 2018-11-05 NOTE — ED Notes (Addendum)
Patient took His medication without hesitation, He is oriented, no complaints, will continue to monitor. Patient is now talking to His sister on the phone.

## 2018-11-05 NOTE — ED Provider Notes (Signed)
-----------------------------------------   4:43 AM on 11/05/2018 -----------------------------------------   Blood pressure (!) 149/90, pulse 88, temperature 98.2 F (36.8 C), temperature source Oral, resp. rate 16, height 6\' 2"  (1.88 m), weight 95.3 kg, SpO2 97 %.  The patient is calm and cooperative at this time.  There have been no acute events since the last update.  Awaiting disposition plan from Behavioral Medicine and/or Social Work team(s).  Patient initially received 10 of Haldol and 50 of Benadryl given some agitation.  However patient has been seen by psychiatry who recommended that patient be IVC and admitted.  Patient awaiting plan bed assignment.   Vanessa Porcupine, MD 11/05/18 [phone]

## 2018-11-05 NOTE — ED Notes (Signed)
SOC called. 

## 2018-11-05 NOTE — ED Notes (Signed)
Hourly rounding reveals patient in room. Still yelling out for "help" with his insomnia and headache complaints, stable, in no acute distress. Q15 minute rounds and monitoring via Engineer, drilling to continue.

## 2018-11-06 DIAGNOSIS — Z79899 Other long term (current) drug therapy: Secondary | ICD-10-CM | POA: Diagnosis not present

## 2018-11-20 ENCOUNTER — Emergency Department: Payer: Medicare Other

## 2018-11-20 ENCOUNTER — Emergency Department
Admission: EM | Admit: 2018-11-20 | Discharge: 2018-11-20 | Disposition: A | Discharge status: Home / Self Care | Payer: Medicare Other | Attending: Emergency Medicine | Admitting: Emergency Medicine

## 2018-11-20 ENCOUNTER — Other Ambulatory Visit: Payer: Self-pay

## 2018-11-20 ENCOUNTER — Encounter: Payer: Self-pay | Admitting: Emergency Medicine

## 2018-11-20 DIAGNOSIS — Z7901 Long term (current) use of anticoagulants: Secondary | ICD-10-CM | POA: Diagnosis not present

## 2018-11-20 DIAGNOSIS — Z7982 Long term (current) use of aspirin: Secondary | ICD-10-CM | POA: Insufficient documentation

## 2018-11-20 DIAGNOSIS — R112 Nausea with vomiting, unspecified: Secondary | ICD-10-CM | POA: Diagnosis not present

## 2018-11-20 DIAGNOSIS — I10 Essential (primary) hypertension: Secondary | ICD-10-CM | POA: Diagnosis not present

## 2018-11-20 DIAGNOSIS — F17210 Nicotine dependence, cigarettes, uncomplicated: Secondary | ICD-10-CM | POA: Diagnosis not present

## 2018-11-20 DIAGNOSIS — Z79899 Other long term (current) drug therapy: Secondary | ICD-10-CM | POA: Diagnosis not present

## 2018-11-20 DIAGNOSIS — E119 Type 2 diabetes mellitus without complications: Secondary | ICD-10-CM | POA: Diagnosis not present

## 2018-11-20 DIAGNOSIS — R1084 Generalized abdominal pain: Secondary | ICD-10-CM | POA: Insufficient documentation

## 2018-11-20 DIAGNOSIS — R1110 Vomiting, unspecified: Secondary | ICD-10-CM | POA: Diagnosis present

## 2018-11-20 DIAGNOSIS — R109 Unspecified abdominal pain: Secondary | ICD-10-CM | POA: Diagnosis not present

## 2018-11-20 DIAGNOSIS — I2510 Atherosclerotic heart disease of native coronary artery without angina pectoris: Secondary | ICD-10-CM | POA: Diagnosis not present

## 2018-11-20 LAB — CBC
HCT: 52.6 % — ABNORMAL HIGH (ref 39.0–52.0)
Hemoglobin: 17.6 g/dL — ABNORMAL HIGH (ref 13.0–17.0)
MCH: 31.7 pg (ref 26.0–34.0)
MCHC: 33.5 g/dL (ref 30.0–36.0)
MCV: 94.8 fL (ref 80.0–100.0)
Platelets: 294 10*3/uL (ref 150–400)
RBC: 5.55 MIL/uL (ref 4.22–5.81)
RDW: 12.9 % (ref 11.5–15.5)
WBC: 10.3 10*3/uL (ref 4.0–10.5)
nRBC: 0 % (ref 0.0–0.2)

## 2018-11-20 LAB — URINALYSIS, COMPLETE (UACMP) WITH MICROSCOPIC
Bilirubin Urine: NEGATIVE
Glucose, UA: 50 mg/dL — AB
Hgb urine dipstick: NEGATIVE
Ketones, ur: NEGATIVE mg/dL
Leukocytes,Ua: NEGATIVE
Nitrite: NEGATIVE
Protein, ur: NEGATIVE mg/dL
Specific Gravity, Urine: 1.021 (ref 1.005–1.030)
pH: 5 (ref 5.0–8.0)

## 2018-11-20 LAB — COMPREHENSIVE METABOLIC PANEL
ALT: 31 U/L (ref 0–44)
AST: 26 U/L (ref 15–41)
Albumin: 4 g/dL (ref 3.5–5.0)
Alkaline Phosphatase: 111 U/L (ref 38–126)
Anion gap: 14 (ref 5–15)
BUN: 10 mg/dL (ref 6–20)
CO2: 19 mmol/L — ABNORMAL LOW (ref 22–32)
Calcium: 9.3 mg/dL (ref 8.9–10.3)
Chloride: 101 mmol/L (ref 98–111)
Creatinine, Ser: 0.97 mg/dL (ref 0.61–1.24)
GFR calc Af Amer: >60 mL/min (ref >60)
GFR calc non Af Amer: >60 mL/min (ref >60)
Glucose, Bld: 156 mg/dL — ABNORMAL HIGH (ref 70–99)
Potassium: 4.2 mmol/L (ref 3.5–5.1)
Sodium: 134 mmol/L — ABNORMAL LOW (ref 135–145)
Total Bilirubin: 0.7 mg/dL (ref 0.3–1.2)
Total Protein: 7.2 g/dL (ref 6.5–8.1)

## 2018-11-20 LAB — LIPASE, BLOOD: Lipase: 20 U/L (ref 11–51)

## 2018-11-20 MED ORDER — ONDANSETRON HCL 4 MG/2ML IJ SOLN
4.0000 mg | Freq: Once | INTRAMUSCULAR | Status: AC
  Administered 2018-11-20: 4 mg via INTRAVENOUS
  Filled 2018-11-20: qty 2

## 2018-11-20 MED ORDER — IOHEXOL 300 MG/ML  SOLN
100.0000 mL | Freq: Once | INTRAMUSCULAR | Status: AC | PRN
  Administered 2018-11-20: 100 mL via INTRAVENOUS

## 2018-11-20 MED ORDER — ONDANSETRON 4 MG PO TBDP: 4.0000 mg | ORAL_TABLET | Freq: Three times a day (TID) | ORAL | 0 refills | Status: DC | PRN

## 2018-11-20 MED ORDER — OXYCODONE-ACETAMINOPHEN 5-325 MG PO TABS: 1.0000 | ORAL_TABLET | ORAL | 0 refills | Status: DC | PRN

## 2018-11-20 MED ORDER — ONDANSETRON 4 MG PO TBDP: 4.0000 mg | ORAL_TABLET | Freq: Once | ORAL | Status: DC | PRN

## 2018-11-20 MED ORDER — MORPHINE SULFATE (PF) 4 MG/ML IV SOLN
4.0000 mg | Freq: Once | INTRAVENOUS | Status: AC
  Administered 2018-11-20: 4 mg via INTRAVENOUS
  Filled 2018-11-20: qty 1

## 2018-11-20 MED ORDER — IOHEXOL 240 MG/ML SOLN: 50.0000 mL | Freq: Once | INTRAMUSCULAR | Status: DC | PRN

## 2018-11-20 MED ORDER — SODIUM CHLORIDE 0.9% FLUSH
3.0000 mL | Freq: Once | INTRAVENOUS | Status: AC
  Administered 2018-11-20: 3 mL via INTRAVENOUS

## 2018-11-20 MED ORDER — SODIUM CHLORIDE 0.9 % IV BOLUS
1000.0000 mL | Freq: Once | INTRAVENOUS | Status: AC
  Administered 2018-11-20: 11:00:00 1000 mL via INTRAVENOUS

## 2018-11-20 NOTE — ED Triage Notes (Signed)
Here with vomiting that started a week ago, states he is aching and has a headache.

## 2018-11-20 NOTE — ED Notes (Signed)
Pt states only bile coming up, yellow and thin.

## 2018-11-20 NOTE — Discharge Instructions (Addendum)
Use the Zofran melt on your tongue wafers 1 wafer 3 times a day as needed for the vomiting and take the hydrocodone 1 pill 3 times a day as needed for the pain.  Return for any worsening.  Please follow-up with your regular doctor.
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
## 2018-11-20 NOTE — ED Notes (Signed)
Returned from CT.

## 2018-11-20 NOTE — ED Notes (Signed)
C/o of abs pain all week with n/v. IV hep lock placed from triage. Patient treated for n/v with IV meds and IV bolus ns. Md at bedside to assess. Pain meds ordered. Patient awaiting ct abd. Will monitor.

## 2018-11-20 NOTE — ED Notes (Signed)
Returned from Ct c/o of pain starting to increase. Md aware. Ct results pending. Family at bedside

## 2018-11-20 NOTE — ED Notes (Addendum)
Patient drinking  po contrast for ct abd

## 2018-11-21 ENCOUNTER — Encounter: Payer: Self-pay | Admitting: Emergency Medicine

## 2018-11-21 ENCOUNTER — Other Ambulatory Visit: Payer: Self-pay

## 2018-11-21 ENCOUNTER — Emergency Department
Admission: EM | Admit: 2018-11-21 | Discharge: 2018-11-21 | Disposition: A | Discharge status: Home / Self Care | Payer: Medicare Other | Attending: Student | Admitting: Student

## 2018-11-21 DIAGNOSIS — Z7902 Long term (current) use of antithrombotics/antiplatelets: Secondary | ICD-10-CM | POA: Diagnosis not present

## 2018-11-21 DIAGNOSIS — Z046 Encounter for general psychiatric examination, requested by authority: Secondary | ICD-10-CM | POA: Diagnosis not present

## 2018-11-21 DIAGNOSIS — R462 Strange and inexplicable behavior: Secondary | ICD-10-CM | POA: Diagnosis not present

## 2018-11-21 DIAGNOSIS — M542 Cervicalgia: Secondary | ICD-10-CM | POA: Diagnosis not present

## 2018-11-21 DIAGNOSIS — F17210 Nicotine dependence, cigarettes, uncomplicated: Secondary | ICD-10-CM | POA: Insufficient documentation

## 2018-11-21 DIAGNOSIS — I2510 Atherosclerotic heart disease of native coronary artery without angina pectoris: Secondary | ICD-10-CM | POA: Diagnosis not present

## 2018-11-21 DIAGNOSIS — R45851 Suicidal ideations: Secondary | ICD-10-CM | POA: Diagnosis not present

## 2018-11-21 DIAGNOSIS — Z7984 Long term (current) use of oral hypoglycemic drugs: Secondary | ICD-10-CM | POA: Diagnosis not present

## 2018-11-21 DIAGNOSIS — J45909 Unspecified asthma, uncomplicated: Secondary | ICD-10-CM | POA: Diagnosis not present

## 2018-11-21 DIAGNOSIS — I10 Essential (primary) hypertension: Secondary | ICD-10-CM | POA: Insufficient documentation

## 2018-11-21 DIAGNOSIS — E119 Type 2 diabetes mellitus without complications: Secondary | ICD-10-CM | POA: Diagnosis not present

## 2018-11-21 DIAGNOSIS — Z79899 Other long term (current) drug therapy: Secondary | ICD-10-CM | POA: Diagnosis not present

## 2018-11-21 DIAGNOSIS — Z7982 Long term (current) use of aspirin: Secondary | ICD-10-CM | POA: Diagnosis not present

## 2018-11-21 LAB — CBC
HCT: 47.9 % (ref 39.0–52.0)
Hemoglobin: 17.1 g/dL — ABNORMAL HIGH (ref 13.0–17.0)
MCH: 31.8 pg (ref 26.0–34.0)
MCHC: 35.7 g/dL (ref 30.0–36.0)
MCV: 89.2 fL (ref 80.0–100.0)
Platelets: 336 10*3/uL (ref 150–400)
RBC: 5.37 MIL/uL (ref 4.22–5.81)
RDW: 12.7 % (ref 11.5–15.5)
WBC: 10.5 10*3/uL (ref 4.0–10.5)
nRBC: 0 % (ref 0.0–0.2)

## 2018-11-21 LAB — COMPREHENSIVE METABOLIC PANEL
ALT: 26 U/L (ref 0–44)
AST: 28 U/L (ref 15–41)
Albumin: 4 g/dL (ref 3.5–5.0)
Alkaline Phosphatase: 104 U/L (ref 38–126)
Anion gap: 14 (ref 5–15)
BUN: 6 mg/dL (ref 6–20)
CO2: 18 mmol/L — ABNORMAL LOW (ref 22–32)
Calcium: 9.7 mg/dL (ref 8.9–10.3)
Chloride: 104 mmol/L (ref 98–111)
Creatinine, Ser: 1.02 mg/dL (ref 0.61–1.24)
GFR calc Af Amer: >60 mL/min (ref >60)
GFR calc non Af Amer: >60 mL/min (ref >60)
Glucose, Bld: 169 mg/dL — ABNORMAL HIGH (ref 70–99)
Potassium: 3.6 mmol/L (ref 3.5–5.1)
Sodium: 136 mmol/L (ref 135–145)
Total Bilirubin: 0.5 mg/dL (ref 0.3–1.2)
Total Protein: 7.1 g/dL (ref 6.5–8.1)

## 2018-11-21 LAB — URINE DRUG SCREEN, QUALITATIVE (ARMC ONLY)
Amphetamines, Ur Screen: NOT DETECTED
Barbiturates, Ur Screen: NOT DETECTED
Benzodiazepine, Ur Scrn: POSITIVE — AB
Cannabinoid 50 Ng, Ur ~~LOC~~: NOT DETECTED
Cocaine Metabolite,Ur ~~LOC~~: NOT DETECTED
MDMA (Ecstasy)Ur Screen: NOT DETECTED
Methadone Scn, Ur: NOT DETECTED
Opiate, Ur Screen: POSITIVE — AB
Phencyclidine (PCP) Ur S: NOT DETECTED
Tricyclic, Ur Screen: NOT DETECTED

## 2018-11-21 LAB — ACETAMINOPHEN LEVEL: Acetaminophen (Tylenol), Serum: 10 ug/mL (ref 10–30)

## 2018-11-21 LAB — ETHANOL: Alcohol, Ethyl (B): <10 mg/dL (ref <10)

## 2018-11-21 LAB — SALICYLATE LEVEL: Salicylate Lvl: 12.6 mg/dL (ref 2.8–30.0)

## 2018-11-21 MED ORDER — ACETAMINOPHEN 500 MG PO TABS
1000.0000 mg | ORAL_TABLET | Freq: Once | ORAL | Status: AC
  Administered 2018-11-21: 1000 mg via ORAL
  Filled 2018-11-21: qty 2

## 2018-11-21 MED ORDER — HALOPERIDOL 5 MG PO TABS
5.0000 mg | ORAL_TABLET | Freq: Once | ORAL | Status: AC
  Administered 2018-11-21: 5 mg via ORAL
  Filled 2018-11-21: qty 1

## 2018-11-21 MED ORDER — DIPHENHYDRAMINE HCL 25 MG PO CAPS
25.0000 mg | ORAL_CAPSULE | Freq: Once | ORAL | Status: AC
  Administered 2018-11-21: 25 mg via ORAL
  Filled 2018-11-21: qty 1

## 2018-11-21 MED ORDER — QUETIAPINE FUMARATE 25 MG PO TABS: 50.0000 mg | ORAL_TABLET | Freq: Every day | ORAL | Status: DC

## 2018-11-21 MED ORDER — DIPHENHYDRAMINE HCL 25 MG PO CAPS
25.0000 mg | ORAL_CAPSULE | Freq: Once | ORAL | Status: DC
  Filled 2018-11-21: qty 1

## 2018-11-21 NOTE — ED Provider Notes (Signed)
Patient with bizarre and mostly attention seeking behavior that is consistent for him.  He is not felt to be a threat to himself or anyone else at this time.  He is cleared for discharge.   Earleen Newport, MD 11/21/18 1540
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
## 2018-11-21 NOTE — ED Notes (Signed)
Pt discharged home. Pt refused VS and signature. Belongings returned to patient. Discharge instructions given to patient.

## 2018-11-21 NOTE — ED Triage Notes (Signed)
Patient is fairly coopertive, but does have sudden verbal outbursts that are loud.  Says he is tired of living with the neck pain and does want [place] to help  him

## 2018-11-21 NOTE — ED Notes (Signed)
Pt continuing to yell and be disruptive to other patients. EDP discharged patient.

## 2018-11-21 NOTE — ED Notes (Signed)
Pt told this writer he can no longer live with his neck pain. He is unable to sleep and take care of his kids.  Pt endorses SI. Denies HI and AVH. Pt was cooperative with this Probation officer, but began yelling left alone in his room. Maintained on 15 minute checks.

## 2018-11-21 NOTE — ED Provider Notes (Signed)
[facility] Emergency Department Provider Note  ____________________________________________   First MD Initiated Contact with Patient 11/21/18 1330     (approximate)  I have reviewed the triage vital signs and the nursing notes.  History  Chief Complaint Suicidal    HPI Michael Schmitt is a 47 y.o. male with a history of anxiety, depression, attention seeking behavior who presents to the emergency department stating he is tired of living with chronic neck pain and no longer wants to live because of this.  He states this pain is chronic and unchanged for him.  He has no new associated trauma, weakness, numbness, or tingling.  He is asking for medication.         Past Medical Hx Past Medical History:  Diagnosis Date   Anxiety    Anxiety    Arthritis    cerv. stenosis, spondylosis, HNP- lower back , has been followed in pain clinic, has  had injection s in cerv. area   Blood dyscrasia    told that when he was young he was a" free bleeder"   CAD (coronary artery disease)    Cervical spondylosis without myelopathy 07/24/2014   Cervicogenic headache 07/24/2014   Chronic kidney disease    renal calculi- passed spontaneously   Depression    Diabetes mellitus without complication ([place])    Fatty liver    GERD (gastroesophageal reflux disease)    Headache(784.0)    Hyperlipidemia    Hypertension    Mental disorder    MI, old    RLS (restless legs syndrome)    detected on sleep study   Shortness of breath    Ventricular fibrillation ([place]) 04/05/2016    Problem List Patient Active Problem List   Diagnosis Date Noted   Alcohol abuse with alcohol-induced mood disorder ([place]) 09/21/2018   Overdose 05/17/2018   Depression, major, recurrent, severe with psychosis ([place]) 03/30/2018   Personality disorder ([place]) 12/31/2017   MDD (major depressive disorder), recurrent episode, severe ([place]) 11/20/2017   Dysthymia 10/26/2017    Severe recurrent major depression without psychotic features ([place]) 03/12/2017   Generalized anxiety disorder 02/05/2017   Chronic neck pain (Primary Area of Pain) (Left) 11/22/2016   Cervical facet syndrome (Left) 11/22/2016   Cervical radiculitis (Left) 11/22/2016   History of cervical spinal surgery 11/22/2016   Cervical spondylosis 11/22/2016   Chronic shoulder pain (Secondary Area of Pain) (Left) 11/22/2016   Arthropathy of shoulder (Left) 11/22/2016   Chronic low back pain [facility] Area of Pain) (Bilateral) (L>R) 11/22/2016   Lumbar facet syndrome (Bilateral) (L>R) 11/22/2016   Chronic pain syndrome 11/21/2016   Long term prescription benzodiazepine use 11/21/2016   Opiate use 11/21/2016   Uncontrolled type 2 diabetes mellitus without complication, without long-term current use of insulin ([place]) 10/19/2016   Chronic Suicidal ideation 09/15/2016   Coronary artery disease 09/12/2016   HTN (hypertension) 06/26/2016   GERD (gastroesophageal reflux disease) 06/26/2016   Alcohol use disorder, severe, 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[place]) 06/26/2016   Benzodiazepine abuse ([place]) 06/11/2016   Sedative, hypnotic or anxiolytic use disorder, severe, dependence ([place]) 05/12/2016   Ventricular fibrillation ([place]) 04/05/2016   Combined hyperlipidemia 01/05/2016   Cervical spondylosis without myelopathy 07/24/2014   Severe episode of recurrent major depressive disorder, without psychotic features ([place]) 04/15/2013   Tobacco use disorder 10/20/2010   Asthma 10/20/2010    Past Surgical Hx Past Surgical History:  Procedure Laterality Date   ANTERIOR CERVICAL DECOMP/DISCECTOMY FUSION  11/13/2011   Procedure: ANTERIOR CERVICAL  DECOMPRESSION/DISCECTOMY FUSION 1 LEVEL;  Surgeon: [name], MD;  Location: [place] NEURO ORS;  Service: Neurosurgery;  Laterality: N/A;  Anterior Cervical Decompression/discectomy Fusion. Cervical three-four.   CARDIAC CATHETERIZATION N/A 01/01/2015   Procedure:  Left Heart Cath and Coronary Angiography;  Surgeon: [name], MD;  Location: [place] CV LAB;  Service: Cardiovascular;  Laterality: N/A;   CARDIAC CATHETERIZATION N/A 04/05/2016   Procedure: Left Heart Cath and Coronary Angiography;  Surgeon: Belva Crome, MD;  Location: [place] CV LAB;  Service: Cardiovascular;  Laterality: N/A;   CARDIAC CATHETERIZATION N/A 04/05/2016   Procedure: Coronary Stent Intervention;  Surgeon: Belva Crome, MD;  Location: [place] CV LAB;  Service: Cardiovascular;  Laterality: N/A;   CARDIAC CATHETERIZATION N/A 04/05/2016   Procedure: Intravascular Ultrasound/IVUS;  Surgeon: Belva Crome, MD;  Location: [place] CV LAB;  Service: Cardiovascular;  Laterality: N/A;   NASAL SINUS SURGERY     2005    Medications Prior to Admission medications   Medication Sig Start Date End Date Taking? Authorizing Provider  albuterol (PROVENTIL HFA;VENTOLIN HFA) 108 (90 Base) MCG/ACT inhaler Inhale 2 puffs into the lungs every 6 (six) hours as needed for wheezing. 04/11/18 04/11/19  [provider]  amoxicillin-clavulanate (AUGMENTIN) 875-125 MG tablet Take 1 tablet by mouth 2 (two) times daily. 11/03/18   Menshew, Dannielle Karvonen, PA-C  ARIPiprazole (ABILIFY) 15 MG tablet Take 15 mg by mouth daily. 10/14/18   [provider]  aspirin 81 MG EC tablet Take 1 tablet (81 mg total) by mouth daily. For heart health 11/21/17   Lindell Spar I, NP  atorvastatin (LIPITOR) 10 MG tablet Take 10 mg by mouth daily. 10/25/18 10/25/19  [provider]  busPIRone (BUSPAR) 10 MG tablet Take 10 mg by mouth 3 (three) times daily. 09/16/18   [provider]  carbamazepine (TEGRETOL) 200 MG tablet Take 200 mg by mouth 2 (two) times daily. 09/16/18   [provider]  carvedilol (COREG) 12.5 MG tablet Take 12.5 mg by mouth 2 (two) times daily with a meal. 10/25/18 10/25/19  [provider]  clonazePAM (KLONOPIN) 0.5 MG tablet Take 0.5 mg by mouth  2 (two) times daily. 05/23/18   [provider]  cloNIDine (CATAPRES) 0.1 MG tablet Take 0.1 mg by mouth 3 (three) times daily. 05/16/18   [provider]  clopidogrel (PLAVIX) 75 MG tablet Take 75 mg by mouth daily. 03/12/18 03/12/19  [provider]  DULoxetine (CYMBALTA) 20 MG capsule Take 20 mg by mouth daily. 08/05/18   [provider]  gabapentin (NEURONTIN) 300 MG capsule Take 3 capsules (900 mg total) by mouth 3 (three) times daily. 04/01/18   Clapacs, Madie Reno, MD  glipiZIDE (GLUCOTROL XL) 5 MG 24 hr tablet Take 5 mg by mouth daily. 10/15/18 10/15/19  [provider]  ibuprofen (ADVIL,MOTRIN) 800 MG tablet Take 2,400 mg by mouth 3 (three) times daily as needed.    [provider]  ketorolac (TORADOL) 10 MG tablet Take 1 tablet (10 mg total) by mouth every 8 (eight) hours. Patient not taking: Reported on 11/05/2018 11/03/18   Menshew, Dannielle Karvonen, PA-C  lamoTRIgine (LAMICTAL) 150 MG tablet Take 150 mg by mouth daily. 07/17/18   [provider]  lisinopril (ZESTRIL) 10 MG tablet Take 10 mg by mouth daily. 10/15/18 10/15/19  [provider]  LORazepam (ATIVAN) 2 MG tablet Take 2 mg by mouth 3 (three) times daily. 10/02/18   [provider]  metFORMIN (GLUCOPHAGE) 1000 MG tablet Take 1,000 mg by mouth 2 (two) times daily. 10/02/18   [provider]  mirtazapine (REMERON) 30 MG tablet Take 30 mg by mouth Nightly. 10/14/18   [provider]  nitroGLYCERIN (NITROSTAT) 0.4 MG SL tablet Place 0.4 mg under the tongue as needed. 06/03/18   [provider]  ondansetron (ZOFRAN ODT) 4 MG disintegrating tablet Take 1 tablet (4 mg total) by mouth every 8 (eight) hours as needed for nausea or vomiting. 11/20/18   Nena Polio, MD  oxyCODONE-acetaminophen (PERCOCET) 5-325 MG tablet Take 1 tablet by mouth every 4 (four) hours as needed for severe pain. 11/20/18 11/20/19  Nena Polio, MD  QUEtiapine (SEROQUEL) 400 MG  tablet Take 400 mg by mouth 2 (two) times daily. 10/12/18   [provider]  REXULTI 3 MG TABS Take 3 mg by mouth daily. 09/03/18   [provider]  sildenafil (REVATIO) 20 MG tablet Take 1 tablet (20 mg total) by mouth daily as needed. 10/15/18   Billey Co, MD    Allergies Prednisone, Amoxicillin, Hydrocodone, Varenicline, and Trazodone and nefazodone  Family Hx Family History  Problem Relation Age of Onset   Prostate cancer Father    Hypertension Mother    Kidney Stones Mother    Anxiety disorder Mother    Depression Mother    COPD Sister    Hypertension Sister    Diabetes Sister    Depression Sister    Anxiety disorder Sister    Seizures Sister    ADD / ADHD Son    ADD / ADHD Daughter     Social Hx Social History   Tobacco Use   Smoking status: Current Every Day Smoker    Packs/day: 2.00    Years: 17.00    Pack years: 34.00    Types: Cigarettes, Cigars   Smokeless tobacco: Former Systems developer   Tobacco comment: occassional snuff   Substance Use Topics   Alcohol use: No    Alcohol/week: 0.0 standard drinks    Comment: last drinked in 3 months.    Drug use: No     Review of Systems  Constitutional: Negative for fever. Negative for chills. Eyes: Negative for visual changes. ENT: Negative for sore throat. Cardiovascular: Negative for chest pain. Respiratory: Negative for shortness of breath. Gastrointestinal: Negative for abdominal pain. Negative for nausea. Negative for vomiting. Genitourinary: Negative for dysuria. Musculoskeletal: + chronic neck pain Skin: Negative for rash. Neurological: Negative for for headaches.   Physical Exam  Vital Signs: ED Triage Vitals  Enc Vitals Group     BP 11/21/18 1307 (!) 161/97     Pulse Rate 11/21/18 1307 (!) 132     Resp 11/21/18 1307 18     Temp 11/21/18 1307 98.2 F (36.8 C)     Temp Source 11/21/18 1307 Oral     SpO2 11/21/18 1307 96 %     Weight --      Height 11/21/18 1308  6\' 2"  (1.88 m)     Head Circumference --      Peak Flow --      Pain Score 11/21/18 1308 10     Pain Loc --      Pain Edu? --      Excl. in [place]? --     Constitutional: Alert and oriented.  Eyes: Conjunctivae clear. Sclera anicteric. Head: Normocephalic. Atraumatic. Nose: No congestion. No rhinorrhea. Mouth/Throat: Mucous membranes are moist.  Neck: No stridor.  FROM of  neck without issues.  Cardiovascular: Normal rate, regular rhythm. No murmurs. Extremities well perfused. Respiratory: Normal respiratory effort.  Lungs CTAB. Gastrointestinal: Soft and non-tender. No distention.  Musculoskeletal: No lower extremity edema. Neurologic:  Normal speech and language. No gross focal neurologic deficits are appreciated.  Skin: Skin is warm, dry and intact. No rash noted. Psychiatric: Abrasive, with sudden loud verbal outbursts. Demanding medications.  EKG  N/A    Radiology  N/A   Procedures  Procedure(s) performed (including critical care):  Procedures   Initial Impression / Assessment and Plan / ED Course  48 y.o. male with a history of anxiety, depression, attention seeking behavior who presents to the emergency department stating he is tired of living with chronic neck pain and no longer wants to live because of this.   On exam, he has full range of motion of his neck without issue.  He has no associated neurological findings.  He has no new or acute neck trauma.  Given this, do not feel acute imaging is indicated at this time.  Will attempt symptom control and reassess.  Final Clinical Impression(s) / ED Diagnosis  Final diagnoses:  Suicidal ideation       Note:  This document was prepared using Dragon voice recognition software and may include unintentional dictation errors.   Lilia Pro., MD 11/21/18 [phone]

## 2018-11-21 NOTE — ED Notes (Signed)
Pt asking for medication to help him sleep, but refuses additional dose of Benadryl stating, "It won't help." Maintained on 15 minute checks.

## 2018-11-22 ENCOUNTER — Encounter: Payer: Self-pay | Admitting: Emergency Medicine

## 2018-11-22 ENCOUNTER — Other Ambulatory Visit: Payer: Self-pay

## 2018-11-22 ENCOUNTER — Emergency Department
Admission: EM | Admit: 2018-11-22 | Discharge: 2018-11-22 | Disposition: A | Discharge status: Home / Self Care | Payer: Medicare Other | Attending: Student in an Organized Health Care Education/Training Program | Admitting: Student in an Organized Health Care Education/Training Program

## 2018-11-22 DIAGNOSIS — E1122 Type 2 diabetes mellitus with diabetic chronic kidney disease: Secondary | ICD-10-CM | POA: Diagnosis not present

## 2018-11-22 DIAGNOSIS — Z79899 Other long term (current) drug therapy: Secondary | ICD-10-CM | POA: Insufficient documentation

## 2018-11-22 DIAGNOSIS — F17210 Nicotine dependence, cigarettes, uncomplicated: Secondary | ICD-10-CM | POA: Diagnosis not present

## 2018-11-22 DIAGNOSIS — N189 Chronic kidney disease, unspecified: Secondary | ICD-10-CM | POA: Diagnosis not present

## 2018-11-22 DIAGNOSIS — F1994 Other psychoactive substance use, unspecified with psychoactive substance-induced mood disorder: Secondary | ICD-10-CM | POA: Diagnosis not present

## 2018-11-22 DIAGNOSIS — R45851 Suicidal ideations: Secondary | ICD-10-CM | POA: Diagnosis not present

## 2018-11-22 DIAGNOSIS — F329 Major depressive disorder, single episode, unspecified: Secondary | ICD-10-CM | POA: Insufficient documentation

## 2018-11-22 DIAGNOSIS — I2510 Atherosclerotic heart disease of native coronary artery without angina pectoris: Secondary | ICD-10-CM | POA: Insufficient documentation

## 2018-11-22 DIAGNOSIS — Z7984 Long term (current) use of oral hypoglycemic drugs: Secondary | ICD-10-CM | POA: Diagnosis not present

## 2018-11-22 MED ORDER — QUETIAPINE FUMARATE 200 MG PO TABS: 400.0000 mg | ORAL_TABLET | Freq: Every day | ORAL | Status: DC

## 2018-11-22 MED ORDER — QUETIAPINE FUMARATE 100 MG PO TABS: 100.0000 mg | ORAL_TABLET | Freq: Every day | ORAL | 0 refills | Status: DC

## 2018-11-22 MED ORDER — QUETIAPINE FUMARATE 200 MG PO TABS
400.0000 mg | ORAL_TABLET | ORAL | Status: AC
  Administered 2018-11-22: 11:00:00 400 mg via ORAL
  Filled 2018-11-22: qty 2

## 2018-11-22 NOTE — ED Notes (Signed)
Patient discharged home, patient received discharge papers and prescription for Seroquel sent to his pharmacy. Patient received belongings and verbalized he has received all of his belongings. Patient continues to yell out and appears to be upset due to the fact he thought he was being admitted, Denies SI/HI AVH. Vital signs taken. NAD noted.

## 2018-11-22 NOTE — ED Notes (Signed)
Patient assigned to appropriate care area   Introduced self to pt  Patient oriented to unit/care area: Informed that, for their safety, care areas are designed for safety and visiting and phone hours explained to patient. Patient verbalizes understanding, and verbal contract for safety obtained  Environment secured  Pt talking loudly

## 2018-11-22 NOTE — ED Triage Notes (Signed)
Patient states he hasn't slept in 10 days. States he is having suicidal thoughts.

## 2018-11-22 NOTE — ED Provider Notes (Signed)
[facility] Emergency Department Provider Note    First MD Initiated Contact with Patient 11/22/18 1019     (approximate)  I have reviewed the triage vital signs and the nursing notes.   HISTORY  Chief Complaint Suicidal    HPI Michael Schmitt is a 48 y.o. male   unknown to this facility presents to the ER for evaluation of suicidal ideation and wanting medications.  Patient has a long history of suicidal ideation and is well-known to this facility.  Does not actually have a plan.  He appears at his baseline right now.  States that he wants to be placed back on his Seroquel.  We will give him a dose of that.  States that that was helping him get better sleep and he feels more suicidal when he is not sleeping.  Denies any hallucinations.  Denies any pain.   Past Medical History:  Diagnosis Date  . Anxiety   . Anxiety   . Arthritis    cerv. stenosis, spondylosis, HNP- lower back , has been followed in pain clinic, has  had injection s in cerv. area  . Blood dyscrasia    told that when he was young he was a" free bleeder"  . CAD (coronary artery disease)   . Cervical spondylosis without myelopathy 07/24/2014  . Cervicogenic headache 07/24/2014  . Chronic kidney disease    renal calculi- passed spontaneously  . Depression   . Diabetes mellitus without complication ([place])   . Fatty liver   . GERD (gastroesophageal reflux disease)   . Headache(784.0)   . Hyperlipidemia   . Hypertension   . Mental disorder   . MI, old   . RLS (restless legs syndrome)    detected on sleep study  . Shortness of breath   . Ventricular fibrillation ([place]) 04/05/2016   Family History  Problem Relation Age of Onset  . Prostate cancer Father   . Hypertension Mother   . Kidney Stones Mother   . Anxiety disorder Mother   . Depression Mother   . COPD Sister   . Hypertension Sister   . Diabetes Sister   . Depression Sister   . Anxiety disorder Sister   . Seizures  Sister   . ADD / ADHD Son   . ADD / ADHD Daughter    Past Surgical History:  Procedure Laterality Date  . ANTERIOR CERVICAL DECOMP/DISCECTOMY FUSION  11/13/2011   Procedure: ANTERIOR CERVICAL DECOMPRESSION/DISCECTOMY FUSION 1 LEVEL;  Surgeon: [name], MD;  Location: [place] NEURO ORS;  Service: Neurosurgery;  Laterality: N/A;  Anterior Cervical Decompression/discectomy Fusion. Cervical three-four.  Marland Kitchen CARDIAC CATHETERIZATION N/A 01/01/2015   Procedure: Left Heart Cath and Coronary Angiography;  Surgeon: [name], MD;  Location: [place] CV LAB;  Service: Cardiovascular;  Laterality: N/A;  . CARDIAC CATHETERIZATION N/A 04/05/2016   Procedure: Left Heart Cath and Coronary Angiography;  Surgeon: Belva Crome, MD;  Location: [place] CV LAB;  Service: Cardiovascular;  Laterality: N/A;  . CARDIAC CATHETERIZATION N/A 04/05/2016   Procedure: Coronary Stent Intervention;  Surgeon: Belva Crome, MD;  Location: [place] CV LAB;  Service: Cardiovascular;  Laterality: N/A;  . CARDIAC CATHETERIZATION N/A 04/05/2016   Procedure: Intravascular Ultrasound/IVUS;  Surgeon: Belva Crome, MD;  Location: [place] CV LAB;  Service: Cardiovascular;  Laterality: N/A;  . NASAL SINUS SURGERY     2005   Patient Active Problem List   Diagnosis Date Noted  . Substance induced mood  disorder ([place]) 11/22/2018  . Alcohol abuse with alcohol-induced mood disorder ([place]) 09/21/2018  . Overdose 05/17/2018  . Depression, major, recurrent, severe with psychosis ([place]) 03/30/2018  . Personality disorder ([place]) 12/31/2017  . MDD (major depressive disorder), recurrent episode, severe ([place]) 11/20/2017  . Dysthymia 10/26/2017  . Generalized anxiety disorder 02/05/2017  . Chronic neck pain (Primary Area of Pain) (Left) 11/22/2016  . Cervical facet syndrome (Left) 11/22/2016  . Cervical radiculitis (Left) 11/22/2016  . History of cervical spinal surgery 11/22/2016  . Cervical spondylosis 11/22/2016  . Chronic shoulder  pain (Secondary Area of Pain) (Left) 11/22/2016  . Arthropathy of shoulder (Left) 11/22/2016  . Chronic low back pain [facility] Area of Pain) (Bilateral) (L>R) 11/22/2016  . Lumbar facet syndrome (Bilateral) (L>R) 11/22/2016  . Chronic pain syndrome 11/21/2016  . Long term prescription benzodiazepine use 11/21/2016  . Opiate use 11/21/2016  . Uncontrolled type 2 diabetes mellitus without complication, without long-term current use of insulin ([place]) 10/19/2016  . Chronic Suicidal ideation 09/15/2016  . Coronary artery disease 09/12/2016  . HTN (hypertension) 06/26/2016  . GERD (gastroesophageal reflux disease) 06/26/2016  . Alcohol use disorder, severe, 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[place]) 06/26/2016  . Benzodiazepine abuse ([place]) 06/11/2016  . Sedative, hypnotic or anxiolytic use disorder, severe, dependence ([place]) 05/12/2016  . Ventricular fibrillation ([place]) 04/05/2016  . Combined hyperlipidemia 01/05/2016  . Cervical spondylosis without myelopathy 07/24/2014  . Severe episode of recurrent major depressive disorder, without psychotic features ([place]) 04/15/2013  . Tobacco use disorder 10/20/2010  . Asthma 10/20/2010      Prior to Admission medications   Medication Sig Start Date End Date Taking? Authorizing Provider  albuterol (PROVENTIL HFA;VENTOLIN HFA) 108 (90 Base) MCG/ACT inhaler Inhale 2 puffs into the lungs every 6 (six) hours as needed for wheezing. 04/11/18 04/11/19  [provider]  amoxicillin-clavulanate (AUGMENTIN) 875-125 MG tablet Take 1 tablet by mouth 2 (two) times daily. 11/03/18   Menshew, Dannielle Karvonen, PA-C  ARIPiprazole (ABILIFY) 15 MG tablet Take 15 mg by mouth daily. 10/14/18   [provider]  aspirin 81 MG EC tablet Take 1 tablet (81 mg total) by mouth daily. For heart health 11/21/17   Lindell Spar I, NP  atorvastatin (LIPITOR) 10 MG tablet Take 10 mg by mouth daily. 10/25/18 10/25/19  [provider]  busPIRone (BUSPAR) 10 MG tablet Take 10 mg by mouth  3 (three) times daily. 09/16/18   [provider]  carbamazepine (TEGRETOL) 200 MG tablet Take 200 mg by mouth 2 (two) times daily. 09/16/18   [provider]  carvedilol (COREG) 12.5 MG tablet Take 12.5 mg by mouth 2 (two) times daily with a meal. 10/25/18 10/25/19  [provider]  clonazePAM (KLONOPIN) 0.5 MG tablet Take 0.5 mg by mouth 2 (two) times daily. 05/23/18   [provider]  cloNIDine (CATAPRES) 0.1 MG tablet Take 0.1 mg by mouth 3 (three) times daily. 05/16/18   [provider]  clopidogrel (PLAVIX) 75 MG tablet Take 75 mg by mouth daily. 03/12/18 03/12/19  [provider]  DULoxetine (CYMBALTA) 20 MG capsule Take 20 mg by mouth daily. 08/05/18   [provider]  gabapentin (NEURONTIN) 300 MG capsule Take 3 capsules (900 mg total) by mouth 3 (three) times daily. 04/01/18   Clapacs, Madie Reno, MD  glipiZIDE (GLUCOTROL XL) 5 MG 24 hr tablet Take 5 mg by mouth daily. 10/15/18 10/15/19  [provider]  ibuprofen (ADVIL,MOTRIN) 800 MG tablet Take 2,400 mg by mouth 3 (three)  times daily as needed.    [provider]  ketorolac (TORADOL) 10 MG tablet Take 1 tablet (10 mg total) by mouth every 8 (eight) hours. Patient not taking: Reported on 11/05/2018 11/03/18   Menshew, Dannielle Karvonen, PA-C  lamoTRIgine (LAMICTAL) 150 MG tablet Take 150 mg by mouth daily. 07/17/18   [provider]  lisinopril (ZESTRIL) 10 MG tablet Take 10 mg by mouth daily. 10/15/18 10/15/19  [provider]  LORazepam (ATIVAN) 2 MG tablet Take 2 mg by mouth 3 (three) times daily. 10/02/18   [provider]  metFORMIN (GLUCOPHAGE) 1000 MG tablet Take 1,000 mg by mouth 2 (two) times daily. 10/02/18   [provider]  mirtazapine (REMERON) 30 MG tablet Take 30 mg by mouth Nightly. 10/14/18   [provider]  nitroGLYCERIN (NITROSTAT) 0.4 MG SL tablet Place 0.4 mg under the tongue as needed. 06/03/18   [provider]   ondansetron (ZOFRAN ODT) 4 MG disintegrating tablet Take 1 tablet (4 mg total) by mouth every 8 (eight) hours as needed for nausea or vomiting. 11/20/18   Nena Polio, MD  oxyCODONE-acetaminophen (PERCOCET) 5-325 MG tablet Take 1 tablet by mouth every 4 (four) hours as needed for severe pain. 11/20/18 11/20/19  Nena Polio, MD  QUEtiapine (SEROQUEL) 100 MG tablet Take 1 tablet (100 mg total) by mouth at bedtime. 11/22/18   Merlyn Lot, MD  REXULTI 3 MG TABS Take 3 mg by mouth daily. 09/03/18   [provider]  sildenafil (REVATIO) 20 MG tablet Take 1 tablet (20 mg total) by mouth daily as needed. 10/15/18   Billey Co, MD    Allergies Prednisone, Amoxicillin, Hydrocodone, Varenicline, and Trazodone and nefazodone    Social History Social History   Tobacco Use  . Smoking status: Current Every Day Smoker    Packs/day: 2.00    Years: 17.00    Pack years: 34.00    Types: Cigarettes, Cigars  . Smokeless tobacco: Former Systems developer  . Tobacco comment: occassional snuff   Substance Use Topics  . Alcohol use: No    Alcohol/week: 0.0 standard drinks    Comment: last drinked in 3 months.   . Drug use: No    Review of Systems Patient denies headaches, rhinorrhea, blurry vision, numbness, shortness of breath, chest pain, edema, cough, abdominal pain, nausea, vomiting, diarrhea, dysuria, fevers, rashes or hallucinations unless otherwise stated above in HPI. ____________________________________________   PHYSICAL EXAM:  VITAL SIGNS: Vitals:   11/22/18 1003 11/22/18 1215  BP: (!) 147/107 130/90  Pulse: (!) 123 90  Resp: 20 18  Temp: 99 F (37.2 C) 98 F (36.7 C)  SpO2: 94% 97%    Constitutional: Alert and oriented.  Eyes: Conjunctivae are normal.  Head: Atraumatic. Nose: No congestion/rhinnorhea. Mouth/Throat: Mucous membranes are moist.   Neck: No stridor. Painless ROM.  Cardiovascular: Normal rate, regular rhythm. Grossly normal heart sounds.  Good peripheral  circulation. Respiratory: Normal respiratory effort.  No retractions. Lungs CTAB. Gastrointestinal: Soft and nontender. No distention. No abdominal bruits. No CVA tenderness. Genitourinary:  Musculoskeletal: No lower extremity tenderness nor edema.  No joint effusions. Neurologic:  Normal speech and language. No facial droop Skin:  Skin is warm, dry and intact. No rash noted. Psychiatric:  Speech and behavior are at his baseline  ____________________________________________   LABS (all labs ordered are listed, but only abnormal results are displayed)  No results found for this or any previous visit (from the past 24 hour(s)). ____________________________________________ ____________________________________________  RADIOLOGY   ____________________________________________   PROCEDURES  Procedure(s) performed:  Procedures    Critical Care performed: no ____________________________________________   INITIAL IMPRESSION / ASSESSMENT AND PLAN / ED COURSE  Pertinent labs & imaging results that were available during my care of the patient were reviewed by me and considered in my medical decision making (see chart for details).   DDX: Psychosis, delirium, medication effect, noncompliance, polysubstance abuse, Si, Hi, depression   Michael Schmitt is a 48 y.o. who presents to the ED with suicidal ideation.  Patient appears at his baseline.  Not actually with any plan and actually denies any suicidal ideation to me currently.    Clinical Course as of Nov 22 1543  Fri Nov 22, 2018  1158 Patient resting comfortably.  No SI at this time is currently at his baseline.  He is requesting refill on Seroquel.  Discussed with psychiatry who has evaluated patient at bedside and agrees to plan.  No indication for further inpatient management.   [PR]    Clinical Course User Index [PR] Merlyn Lot, MD    The patient was evaluated in Emergency Department today for the symptoms  described in the history of present illness. He/she was evaluated in the context of the global COVID-19 pandemic, which necessitated consideration that the patient might be at risk for infection with the SARS-CoV-2 virus that causes COVID-19. Institutional protocols and algorithms that pertain to the evaluation of patients at risk for COVID-19 are in a state of rapid change based on information released by regulatory bodies including the CDC and federal and state organizations. These policies and algorithms were followed during the patient's care in the ED.  As part of my medical decision making, I reviewed the following data within the [place] notes reviewed and incorporated, Labs reviewed, notes from prior ED visits and [place] Controlled Substance Database   ____________________________________________   FINAL CLINICAL IMPRESSION(S) / ED DIAGNOSES  Final diagnoses:  Suicidal ideation      NEW MEDICATIONS STARTED DURING THIS VISIT:  Discharge Medication List as of 11/22/2018 12:05 PM       Note:  This document was prepared using Dragon voice recognition software and may include unintentional dictation errors.    Merlyn Lot, MD 11/22/18 [phone]

## 2018-11-22 NOTE — Consult Note (Addendum)
[facility] Psych ED Discharge  11/22/2018 12:30 PM Michael Schmitt  MRN:  [id number] Principal Problem: Substance induced mood disorder [facility]) Discharge Diagnoses: Principal Problem:   Substance induced mood disorder ([place])  Subjective: "I need my Seroquel."  Reports it was recently discontinued but he feels it is the only thing that helped him.  Difficulty sleeping for the past few days.  HPI:  48 yo male presenting with insomnia issues, released from the ED yesterday for yelling unnecessarily with attention speaking behaviors.  Today, he wants Seroquel for sleep.  He feels this is the only thing that works for him.  No suicidal/homicidal ideations, hallucinations.  No withdrawal symptoms and does not feel alcohol or substances are an issue for him despite admissions in the past.  Well known to this ED for similar presentations, yelling for help but no distress.  Appears attention speaking.  Reports it is stressful at home where he lives with his sister and his two children.  Follows up with [employer].  Stable for discharge with Rx for Seroquel.  Total Time spent with patient: 1 hour  Past Psychiatric History: substance abuse, depression, anxiety  Past Medical History:  Past Medical History:  Diagnosis Date  . Anxiety   . Anxiety   . Arthritis    cerv. stenosis, spondylosis, HNP- lower back , has been followed in pain clinic, has  had injection s in cerv. area  . Blood dyscrasia    told that when he was young he was a" free bleeder"  . CAD (coronary artery disease)   . Cervical spondylosis without myelopathy 07/24/2014  . Cervicogenic headache 07/24/2014  . Chronic kidney disease    renal calculi- passed spontaneously  . Depression   . Diabetes mellitus without complication ([place])   . Fatty liver   . GERD (gastroesophageal reflux disease)   . Headache(784.0)   . Hyperlipidemia   . Hypertension   . Mental disorder   . MI, old   . RLS (restless legs syndrome)    detected on  sleep study  . Shortness of breath   . Ventricular fibrillation ([place]) 04/05/2016    Past Surgical History:  Procedure Laterality Date  . ANTERIOR CERVICAL DECOMP/DISCECTOMY FUSION  11/13/2011   Procedure: ANTERIOR CERVICAL DECOMPRESSION/DISCECTOMY FUSION 1 LEVEL;  Surgeon: [name], MD;  Location: [place] NEURO ORS;  Service: Neurosurgery;  Laterality: N/A;  Anterior Cervical Decompression/discectomy Fusion. Cervical three-four.  Marland Kitchen CARDIAC CATHETERIZATION N/A 01/01/2015   Procedure: Left Heart Cath and Coronary Angiography;  Surgeon: [name], MD;  Location: [place] CV LAB;  Service: Cardiovascular;  Laterality: N/A;  . CARDIAC CATHETERIZATION N/A 04/05/2016   Procedure: Left Heart Cath and Coronary Angiography;  Surgeon: Belva Crome, MD;  Location: [place] CV LAB;  Service: Cardiovascular;  Laterality: N/A;  . CARDIAC CATHETERIZATION N/A 04/05/2016   Procedure: Coronary Stent Intervention;  Surgeon: Belva Crome, MD;  Location: [place] CV LAB;  Service: Cardiovascular;  Laterality: N/A;  . CARDIAC CATHETERIZATION N/A 04/05/2016   Procedure: Intravascular Ultrasound/IVUS;  Surgeon: Belva Crome, MD;  Location: [place] CV LAB;  Service: Cardiovascular;  Laterality: N/A;  . NASAL SINUS SURGERY     2005   Family History:  Family History  Problem Relation Age of Onset  . Prostate cancer Father   . Hypertension Mother   . Kidney Stones Mother   . Anxiety disorder Mother   . Depression Mother   . COPD Sister   . Hypertension Sister   .  Diabetes Sister   . Depression Sister   . Anxiety disorder Sister   . Seizures Sister   . ADD / ADHD Son   . ADD / ADHD Daughter    Family Psychiatric  History: see above Social History:  Social History   Substance and Sexual Activity  Alcohol Use No  . Alcohol/week: 0.0 standard drinks   Comment: last drinked in 3 months.      Social History   Substance and Sexual Activity  Drug Use No    Social History   Socioeconomic  History  . Marital status: Single    Spouse name: Not on file  . Number of children: 3  . Years of education: 51  . Highest education level: Not on file  Occupational History    Comment: unemployed  Social Needs  . Financial resource strain: Not on file  . Food insecurity    Worry: Not on file    Inability: Not on file  . Transportation needs    Medical: Not on file    Non-medical: Not on file  Tobacco Use  . Smoking status: Current Every Day Smoker    Packs/day: 2.00    Years: 17.00    Pack years: 34.00    Types: Cigarettes, Cigars  . Smokeless tobacco: Former Systems developer  . Tobacco comment: occassional snuff   Substance and Sexual Activity  . Alcohol use: No    Alcohol/week: 0.0 standard drinks    Comment: last drinked in 3 months.   . Drug use: No  . Sexual activity: Not Currently  Lifestyle  . Physical activity    Days per week: Not on file    Minutes per session: Not on file  . Stress: Not on file  Relationships  . Social Herbalist on phone: Not on file    Gets together: Not on file    Attends religious service: Not on file    Active member of club or organization: Not on file    Attends meetings of clubs or organizations: Not on file    Relationship status: Not on file  Other Topics Concern  . Not on file  Social History Narrative   Patient is right handed.   Patient drinks 2 sodas daily.    Has this patient used any form of tobacco in the last 30 days? (Cigarettes, Smokeless Tobacco, Cigars, and/or Pipes) A prescription for an FDA-approved tobacco cessation medication was offered at discharge and the patient refused  Current Medications: No current facility-administered medications for this encounter.    Current Outpatient Medications  Medication Sig Dispense Refill  . albuterol (PROVENTIL HFA;VENTOLIN HFA) 108 (90 Base) MCG/ACT inhaler Inhale 2 puffs into the lungs every 6 (six) hours as needed for wheezing.    Marland Kitchen amoxicillin-clavulanate  (AUGMENTIN) 875-125 MG tablet Take 1 tablet by mouth 2 (two) times daily. 20 tablet 0  . ARIPiprazole (ABILIFY) 15 MG tablet Take 15 mg by mouth daily.    Marland Kitchen aspirin 81 MG EC tablet Take 1 tablet (81 mg total) by mouth daily. For heart health 30 tablet 1  . atorvastatin (LIPITOR) 10 MG tablet Take 10 mg by mouth daily.    . busPIRone (BUSPAR) 10 MG tablet Take 10 mg by mouth 3 (three) times daily.    . carbamazepine (TEGRETOL) 200 MG tablet Take 200 mg by mouth 2 (two) times daily.    . carvedilol (COREG) 12.5 MG tablet Take 12.5 mg by mouth 2 (two) times daily with  a meal.    . clonazePAM (KLONOPIN) 0.5 MG tablet Take 0.5 mg by mouth 2 (two) times daily.    . cloNIDine (CATAPRES) 0.1 MG tablet Take 0.1 mg by mouth 3 (three) times daily.    . clopidogrel (PLAVIX) 75 MG tablet Take 75 mg by mouth daily.    . DULoxetine (CYMBALTA) 20 MG capsule Take 20 mg by mouth daily.    Marland Kitchen gabapentin (NEURONTIN) 300 MG capsule Take 3 capsules (900 mg total) by mouth 3 (three) times daily. 270 capsule 0  . glipiZIDE (GLUCOTROL XL) 5 MG 24 hr tablet Take 5 mg by mouth daily.    Marland Kitchen ibuprofen (ADVIL,MOTRIN) 800 MG tablet Take 2,400 mg by mouth 3 (three) times daily as needed.    Marland Kitchen ketorolac (TORADOL) 10 MG tablet Take 1 tablet (10 mg total) by mouth every 8 (eight) hours. (Patient not taking: Reported on 11/05/2018) 15 tablet 0  . lamoTRIgine (LAMICTAL) 150 MG tablet Take 150 mg by mouth daily.    Marland Kitchen lisinopril (ZESTRIL) 10 MG tablet Take 10 mg by mouth daily.    Marland Kitchen LORazepam (ATIVAN) 2 MG tablet Take 2 mg by mouth 3 (three) times daily.    . metFORMIN (GLUCOPHAGE) 1000 MG tablet Take 1,000 mg by mouth 2 (two) times daily.    . mirtazapine (REMERON) 30 MG tablet Take 30 mg by mouth Nightly.    . nitroGLYCERIN (NITROSTAT) 0.4 MG SL tablet Place 0.4 mg under the tongue as needed.    . ondansetron (ZOFRAN ODT) 4 MG disintegrating tablet Take 1 tablet (4 mg total) by mouth every 8 (eight) hours as needed for nausea or  vomiting. 20 tablet 0  . oxyCODONE-acetaminophen (PERCOCET) 5-325 MG tablet Take 1 tablet by mouth every 4 (four) hours as needed for severe pain. 4 tablet 0  . QUEtiapine (SEROQUEL) 100 MG tablet Take 1 tablet (100 mg total) by mouth at bedtime. 10 tablet 0  . REXULTI 3 MG TABS Take 3 mg by mouth daily.    . sildenafil (REVATIO) 20 MG tablet Take 1 tablet (20 mg total) by mouth daily as needed. 30 tablet 0   PTA Medications: (Not in a hospital admission)   Musculoskeletal: Strength & Muscle Tone: within normal limits Gait & Station: normal Patient leans: N/A  Psychiatric Specialty Exam: Physical Exam  Nursing note and vitals reviewed. Constitutional: He is oriented to person, place, and time. He appears well-developed and well-nourished.  HENT:  Head: Normocephalic.  Neck: Normal range of motion.  Respiratory: Effort normal.  Musculoskeletal: Normal range of motion.  Neurological: He is alert and oriented to person, place, and time.  Psychiatric: His speech is normal and behavior is normal. Judgment and thought content normal. His mood appears anxious. Cognition and memory are normal. He exhibits a depressed mood.    Review of Systems  Musculoskeletal: Positive for back pain.  Psychiatric/Behavioral: Positive for depression and substance abuse. The patient is nervous/anxious.   All other systems reviewed and are negative.   Blood pressure (!) 147/107, pulse (!) 123, temperature 99 F (37.2 C), temperature source Oral, resp. rate 20, height 6\' 2"  (1.88 m), weight 95.3 kg, SpO2 94 %.Body mass index is 26.96 kg/m.  General Appearance: Casual  Eye Contact:  Good  Speech:  Normal Rate  Volume:  Normal  Mood:  Anxious and Depressed  Affect:  Congruent  Thought Process:  Coherent and Descriptions of Associations: Intact  Orientation:  Full (Time, Place, and Person)  Thought Content:  WDL and Logical  Suicidal Thoughts:  No  Homicidal Thoughts:  No  Memory:  Immediate;    Good Recent;   Good Remote;   Good  Judgement:  Fair  Insight:  Fair  Psychomotor Activity:  Normal  Concentration:  Concentration: Good and Attention Span: Good  Recall:  Good  Fund of Knowledge:  Fair  Language:  Good  Akathisia:  No  Handed:  Right  AIMS (if indicated):     Assets:  Leisure Time Physical Health Resilience Social Support  ADL's:  Intact  Cognition:  WNL  Sleep:        Demographic Factors:  Male and Caucasian  Loss Factors: NA  Historical Factors: NA  Risk Reduction Factors:   Sense of responsibility to family, Living with another person, especially a relative, Positive social support and Positive therapeutic relationship  Continued Clinical Symptoms:  Depression and anxiety, mild  Cognitive Features That Contribute To Risk:  None    Suicide Risk:  Minimal: No identifiable suicidal ideation.  Patients presenting with no risk factors but with morbid ruminations; may be classified as minimal risk based on the severity of the depressive symptoms  Follow-up Information    Schedule an appointment as soon as possible for a visit  with [facility] information: [place] [address] [phone]        Schedule an appointment as soon as possible for a visit  with [facility]   Contact information: [place] [address] [phone]           Plan Of Care/Follow-up recommendations:  Substance abuse: -Referred to [facility], his provider -Recommend AA with a sponsor  Insomnia: -Seroquel 200 mg at bedtime -Rx provided Activity:  as tolerated Diet:  heart healthy diet  Disposition: discharge home Waylan Boga, NP 11/22/2018, 12:30 PM

## 2018-11-22 NOTE — Discharge Instructions (Signed)
Follow up with [employer]

## 2018-11-22 NOTE — ED Notes (Signed)
Pt dressed out. Pt belongings bag:  1 pair of flip flops 1 camo shorts 1 blue shirt 1 black wallet No cash

## 2018-11-27 DIAGNOSIS — Z79899 Other long term (current) drug therapy: Secondary | ICD-10-CM | POA: Diagnosis not present

## 2018-12-02 DIAGNOSIS — Z7982 Long term (current) use of aspirin: Secondary | ICD-10-CM | POA: Diagnosis not present

## 2018-12-02 DIAGNOSIS — I129 Hypertensive chronic kidney disease with stage 1 through stage 4 chronic kidney disease, or unspecified chronic kidney disease: Secondary | ICD-10-CM | POA: Diagnosis not present

## 2018-12-02 DIAGNOSIS — Z79899 Other long term (current) drug therapy: Secondary | ICD-10-CM | POA: Diagnosis not present

## 2018-12-02 DIAGNOSIS — I252 Old myocardial infarction: Secondary | ICD-10-CM | POA: Diagnosis not present

## 2018-12-02 DIAGNOSIS — I2510 Atherosclerotic heart disease of native coronary artery without angina pectoris: Secondary | ICD-10-CM | POA: Diagnosis not present

## 2018-12-02 DIAGNOSIS — R05 Cough: Secondary | ICD-10-CM | POA: Diagnosis not present

## 2018-12-02 DIAGNOSIS — R6883 Chills (without fever): Secondary | ICD-10-CM | POA: Diagnosis not present

## 2018-12-02 DIAGNOSIS — Z823 Family history of stroke: Secondary | ICD-10-CM | POA: Diagnosis not present

## 2018-12-02 DIAGNOSIS — Z880 Allergy status to penicillin: Secondary | ICD-10-CM | POA: Diagnosis not present

## 2018-12-02 DIAGNOSIS — Z888 Allergy status to other drugs, medicaments and biological substances status: Secondary | ICD-10-CM | POA: Diagnosis not present

## 2018-12-02 DIAGNOSIS — Z885 Allergy status to narcotic agent status: Secondary | ICD-10-CM | POA: Diagnosis not present

## 2018-12-02 DIAGNOSIS — Z981 Arthrodesis status: Secondary | ICD-10-CM | POA: Diagnosis not present

## 2018-12-02 DIAGNOSIS — Z825 Family history of asthma and other chronic lower respiratory diseases: Secondary | ICD-10-CM | POA: Diagnosis not present

## 2018-12-02 DIAGNOSIS — N189 Chronic kidney disease, unspecified: Secondary | ICD-10-CM | POA: Diagnosis not present

## 2018-12-02 DIAGNOSIS — K219 Gastro-esophageal reflux disease without esophagitis: Secondary | ICD-10-CM | POA: Diagnosis not present

## 2018-12-02 DIAGNOSIS — G2581 Restless legs syndrome: Secondary | ICD-10-CM | POA: Diagnosis not present

## 2018-12-02 DIAGNOSIS — E1122 Type 2 diabetes mellitus with diabetic chronic kidney disease: Secondary | ICD-10-CM | POA: Diagnosis not present

## 2018-12-02 DIAGNOSIS — Z7902 Long term (current) use of antithrombotics/antiplatelets: Secondary | ICD-10-CM | POA: Diagnosis not present

## 2018-12-05 DIAGNOSIS — I10 Essential (primary) hypertension: Secondary | ICD-10-CM | POA: Diagnosis not present

## 2018-12-05 DIAGNOSIS — I2510 Atherosclerotic heart disease of native coronary artery without angina pectoris: Secondary | ICD-10-CM | POA: Diagnosis not present

## 2018-12-05 DIAGNOSIS — J449 Chronic obstructive pulmonary disease, unspecified: Secondary | ICD-10-CM | POA: Diagnosis not present

## 2018-12-05 DIAGNOSIS — E119 Type 2 diabetes mellitus without complications: Secondary | ICD-10-CM | POA: Diagnosis not present

## 2018-12-05 DIAGNOSIS — M62838 Other muscle spasm: Secondary | ICD-10-CM | POA: Diagnosis not present

## 2018-12-17 ENCOUNTER — Other Ambulatory Visit: Payer: Self-pay

## 2018-12-17 ENCOUNTER — Encounter: Payer: Self-pay | Admitting: Emergency Medicine

## 2018-12-17 ENCOUNTER — Emergency Department
Admission: EM | Admit: 2018-12-17 | Discharge: 2018-12-17 | Disposition: A | Discharge status: Home / Self Care | Payer: Medicare Other | Attending: Emergency Medicine | Admitting: Emergency Medicine

## 2018-12-17 DIAGNOSIS — Z5321 Procedure and treatment not carried out due to patient leaving prior to being seen by health care provider: Secondary | ICD-10-CM | POA: Insufficient documentation

## 2018-12-17 DIAGNOSIS — R000 Tachycardia, unspecified: Secondary | ICD-10-CM | POA: Diagnosis not present

## 2018-12-17 DIAGNOSIS — R55 Syncope and collapse: Secondary | ICD-10-CM | POA: Diagnosis not present

## 2018-12-17 LAB — CBC
HCT: 45.8 % (ref 39.0–52.0)
Hemoglobin: 15.3 g/dL (ref 13.0–17.0)
MCH: 31 pg (ref 26.0–34.0)
MCHC: 33.4 g/dL (ref 30.0–36.0)
MCV: 92.7 fL (ref 80.0–100.0)
Platelets: 252 10*3/uL (ref 150–400)
RBC: 4.94 MIL/uL (ref 4.22–5.81)
RDW: 12.8 % (ref 11.5–15.5)
WBC: 10.6 10*3/uL — ABNORMAL HIGH (ref 4.0–10.5)
nRBC: 0 % (ref 0.0–0.2)

## 2018-12-17 LAB — BASIC METABOLIC PANEL
Anion gap: 11 (ref 5–15)
BUN: 7 mg/dL (ref 6–20)
CO2: 22 mmol/L (ref 22–32)
Calcium: 9.4 mg/dL (ref 8.9–10.3)
Chloride: 103 mmol/L (ref 98–111)
Creatinine, Ser: 0.89 mg/dL (ref 0.61–1.24)
GFR calc Af Amer: >60 mL/min (ref >60)
GFR calc non Af Amer: >60 mL/min (ref >60)
Glucose, Bld: 216 mg/dL — ABNORMAL HIGH (ref 70–99)
Potassium: 3.6 mmol/L (ref 3.5–5.1)
Sodium: 136 mmol/L (ref 135–145)

## 2018-12-17 NOTE — ED Notes (Signed)
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 BG 236. Pt is alert and oriented x4.

## 2018-12-17 NOTE — ED Notes (Addendum)
Pt asking stat desk if he could "go smoke a cigarette" this writer explained to pt that he can not go outside with a IV in (PT came in via EMS). Pt states "I don't care I want to smoke a cigarette because ya'll are taking too long" this writer explained that this is a non-smoking facility and pt replied saying "well i'll walk off the property" this Probation officer then explained that if he leaves the property he will have to check back in and start the process over again. This Probation officer in triage room 1 to remove IV with permission from first [place], pt advised that if he decides to leave then he needs to let the front desk know, pt verbalizes understanding.

## 2018-12-17 NOTE — ED Notes (Signed)
Pt went to sit outside with significant other. IV taken out per NT. This RN told pt to let us know if he leaves. Pt verbalized understanding.

## 2018-12-17 NOTE — ED Triage Notes (Signed)
Patient reports he has been having episodes where he will get dizzy and then pass out. States these have been intermittent over the last week.

## 2018-12-30 DIAGNOSIS — R0781 Pleurodynia: Secondary | ICD-10-CM | POA: Diagnosis not present

## 2018-12-30 DIAGNOSIS — Z825 Family history of asthma and other chronic lower respiratory diseases: Secondary | ICD-10-CM | POA: Diagnosis not present

## 2018-12-30 DIAGNOSIS — Z7982 Long term (current) use of aspirin: Secondary | ICD-10-CM | POA: Diagnosis not present

## 2018-12-30 DIAGNOSIS — N186 End stage renal disease: Secondary | ICD-10-CM | POA: Diagnosis not present

## 2018-12-30 DIAGNOSIS — Z79899 Other long term (current) drug therapy: Secondary | ICD-10-CM | POA: Diagnosis not present

## 2018-12-30 DIAGNOSIS — E1122 Type 2 diabetes mellitus with diabetic chronic kidney disease: Secondary | ICD-10-CM | POA: Diagnosis not present

## 2018-12-30 DIAGNOSIS — Z955 Presence of coronary angioplasty implant and graft: Secondary | ICD-10-CM | POA: Diagnosis not present

## 2018-12-30 DIAGNOSIS — Z981 Arthrodesis status: Secondary | ICD-10-CM | POA: Diagnosis not present

## 2018-12-30 DIAGNOSIS — Z7984 Long term (current) use of oral hypoglycemic drugs: Secondary | ICD-10-CM | POA: Diagnosis not present

## 2018-12-30 DIAGNOSIS — I2510 Atherosclerotic heart disease of native coronary artery without angina pectoris: Secondary | ICD-10-CM | POA: Diagnosis not present

## 2018-12-30 DIAGNOSIS — Z880 Allergy status to penicillin: Secondary | ICD-10-CM | POA: Diagnosis not present

## 2018-12-30 DIAGNOSIS — Z823 Family history of stroke: Secondary | ICD-10-CM | POA: Diagnosis not present

## 2018-12-30 DIAGNOSIS — I252 Old myocardial infarction: Secondary | ICD-10-CM | POA: Diagnosis not present

## 2018-12-30 DIAGNOSIS — Z7902 Long term (current) use of antithrombotics/antiplatelets: Secondary | ICD-10-CM | POA: Diagnosis not present

## 2018-12-30 DIAGNOSIS — I120 Hypertensive chronic kidney disease with stage 5 chronic kidney disease or end stage renal disease: Secondary | ICD-10-CM | POA: Diagnosis not present

## 2018-12-31 DIAGNOSIS — R0781 Pleurodynia: Secondary | ICD-10-CM | POA: Diagnosis not present

## 2019-01-01 DIAGNOSIS — E119 Type 2 diabetes mellitus without complications: Secondary | ICD-10-CM | POA: Diagnosis not present

## 2019-01-01 DIAGNOSIS — I10 Essential (primary) hypertension: Secondary | ICD-10-CM | POA: Diagnosis not present

## 2019-01-01 DIAGNOSIS — K219 Gastro-esophageal reflux disease without esophagitis: Secondary | ICD-10-CM | POA: Diagnosis not present

## 2019-01-15 ENCOUNTER — Emergency Department
Admission: EM | Admit: 2019-01-15 | Discharge: 2019-01-15 | Discharge status: Against Medical Advice | Payer: Medicare Other | Attending: Emergency Medicine | Admitting: Emergency Medicine

## 2019-01-15 ENCOUNTER — Other Ambulatory Visit: Payer: Self-pay

## 2019-01-15 ENCOUNTER — Emergency Department: Payer: Medicare Other

## 2019-01-15 DIAGNOSIS — Z20828 Contact with and (suspected) exposure to other viral communicable diseases: Secondary | ICD-10-CM | POA: Insufficient documentation

## 2019-01-15 DIAGNOSIS — R531 Weakness: Secondary | ICD-10-CM | POA: Diagnosis not present

## 2019-01-15 DIAGNOSIS — S0990XA Unspecified injury of head, initial encounter: Secondary | ICD-10-CM | POA: Diagnosis not present

## 2019-01-15 DIAGNOSIS — T50904A Poisoning by unspecified drugs, medicaments and biological substances, undetermined, initial encounter: Secondary | ICD-10-CM | POA: Diagnosis not present

## 2019-01-15 DIAGNOSIS — Z5321 Procedure and treatment not carried out due to patient leaving prior to being seen by health care provider: Secondary | ICD-10-CM | POA: Insufficient documentation

## 2019-01-15 DIAGNOSIS — T887XXA Unspecified adverse effect of drug or medicament, initial encounter: Secondary | ICD-10-CM | POA: Diagnosis not present

## 2019-01-15 DIAGNOSIS — R0902 Hypoxemia: Secondary | ICD-10-CM | POA: Diagnosis not present

## 2019-01-15 DIAGNOSIS — E860 Dehydration: Secondary | ICD-10-CM

## 2019-01-15 DIAGNOSIS — I959 Hypotension, unspecified: Secondary | ICD-10-CM | POA: Diagnosis not present

## 2019-01-15 DIAGNOSIS — R410 Disorientation, unspecified: Secondary | ICD-10-CM | POA: Diagnosis not present

## 2019-01-15 DIAGNOSIS — R296 Repeated falls: Secondary | ICD-10-CM | POA: Diagnosis not present

## 2019-01-15 DIAGNOSIS — M542 Cervicalgia: Secondary | ICD-10-CM | POA: Diagnosis present

## 2019-01-15 LAB — URINE DRUG SCREEN, QUALITATIVE (ARMC ONLY)
Amphetamines, Ur Screen: POSITIVE — AB
Barbiturates, Ur Screen: NOT DETECTED
Benzodiazepine, Ur Scrn: POSITIVE — AB
Cannabinoid 50 Ng, Ur ~~LOC~~: NOT DETECTED
Cocaine Metabolite,Ur ~~LOC~~: NOT DETECTED
MDMA (Ecstasy)Ur Screen: NOT DETECTED
Methadone Scn, Ur: NOT DETECTED
Opiate, Ur Screen: NOT DETECTED
Phencyclidine (PCP) Ur S: NOT DETECTED
Tricyclic, Ur Screen: POSITIVE — AB

## 2019-01-15 LAB — COMPREHENSIVE METABOLIC PANEL
ALT: 21 U/L (ref 0–44)
AST: 17 U/L (ref 15–41)
Albumin: 3.7 g/dL (ref 3.5–5.0)
Alkaline Phosphatase: 90 U/L (ref 38–126)
Anion gap: 5 (ref 5–15)
BUN: 13 mg/dL (ref 6–20)
CO2: 25 mmol/L (ref 22–32)
Calcium: 8.8 mg/dL — ABNORMAL LOW (ref 8.9–10.3)
Chloride: 107 mmol/L (ref 98–111)
Creatinine, Ser: 1.81 mg/dL — ABNORMAL HIGH (ref 0.61–1.24)
GFR calc Af Amer: 51 mL/min — ABNORMAL LOW (ref >60)
GFR calc non Af Amer: 44 mL/min — ABNORMAL LOW (ref >60)
Glucose, Bld: 117 mg/dL — ABNORMAL HIGH (ref 70–99)
Potassium: 3.6 mmol/L (ref 3.5–5.1)
Sodium: 137 mmol/L (ref 135–145)
Total Bilirubin: 0.6 mg/dL (ref 0.3–1.2)
Total Protein: 6.1 g/dL — ABNORMAL LOW (ref 6.5–8.1)

## 2019-01-15 LAB — CBC
HCT: 39.1 % (ref 39.0–52.0)
Hemoglobin: 12.7 g/dL — ABNORMAL LOW (ref 13.0–17.0)
MCH: 30.7 pg (ref 26.0–34.0)
MCHC: 32.5 g/dL (ref 30.0–36.0)
MCV: 94.4 fL (ref 80.0–100.0)
Platelets: 252 10*3/uL (ref 150–400)
RBC: 4.14 MIL/uL — ABNORMAL LOW (ref 4.22–5.81)
RDW: 13.7 % (ref 11.5–15.5)
WBC: 9.2 10*3/uL (ref 4.0–10.5)
nRBC: 0 % (ref 0.0–0.2)

## 2019-01-15 LAB — LACTIC ACID, PLASMA: Lactic Acid, Venous: 0.8 mmol/L (ref 0.5–1.9)

## 2019-01-15 LAB — AMMONIA: Ammonia: <9 umol/L — ABNORMAL LOW (ref 9–35)

## 2019-01-15 LAB — ETHANOL: Alcohol, Ethyl (B): <10 mg/dL (ref <10)

## 2019-01-15 MED ORDER — SODIUM CHLORIDE 0.9 % IV BOLUS
1000.0000 mL | Freq: Once | INTRAVENOUS | Status: AC
  Administered 2019-01-15: 18:00:00 1000 mL via INTRAVENOUS

## 2019-01-15 NOTE — ED Triage Notes (Signed)
Pt arrives via [facility] from home for frequent falls that began this past Thursday. Pt is poor historian. Ems reports pt was here recently for a mental health stay and was prescribed adderall. PT's bp 88/55 in route and received 751ml NS IV. Pt A&Ox4 and in NAD. Reports neck pain. States he did not hit his head during these falls

## 2019-01-15 NOTE — ED Notes (Addendum)
When I entered the room the pt was standing beside the bed stating "I want to go home". I asked him to please sit back down in the bed and to not get up on his own. Pt states "I am not staying in this damn hospital all night, I am ready to get the fuck out of here". Pt states he has to urinate, advised to use the urinal and pt refuses stating "I am not using that, I am ready to get out of here".Assisted pt to toilet bc he refuses to use the urinal and started to get up out of bed without assistance. Dr. Jacqualine Code informed. Pt refuses to wear BP cuff and O2 sensor

## 2019-01-15 NOTE — ED Notes (Signed)
Pt otf for imaging 

## 2019-01-15 NOTE — ED Notes (Signed)
Pt given bag with shoes and socks in it

## 2019-01-16 LAB — SARS CORONAVIRUS 2 (TAT 6-24 HRS): SARS Coronavirus 2: NEGATIVE

## 2019-01-16 NOTE — ED Provider Notes (Signed)
[facility] Emergency Department Provider Note   ____________________________________________   First MD Initiated Contact with Patient 01/15/19 1739     (approximate)  I have reviewed the triage vital signs and the nursing notes.   HISTORY  Chief Complaint Weakness    HPI Michael Schmitt is a 48 y.o. male here for evaluation for feeling weak and generally fatigued.  Patient reports he recent left the hospital for mental health evaluation, started on new medication including Adderall.  EMS reports they were called out because he been falling frequently and feeling very weak.  Initially hypotensive with EMS responding well to IV fluids.  Patient reports that he is feeling better now on ER arrival.  No recent fevers or chills.  No nausea vomiting.  Denies headache.  Reports he thinks maybe his symptoms are from his new medication.  Denies overdose or desire to harm himself or anyone else.  Reports he would like to be able to get up and start walking around soon.  Would like to speak to his wife.  No chest pain.  No headache.  Denies head injury.  Recently left psychiatric hospital, but reports he is not wanting to harm himself or anyone else.  Denies Covid.  Denies Covid exposure.   Past Medical History:  Diagnosis Date  . Anxiety   . Anxiety   . Arthritis    cerv. stenosis, spondylosis, HNP- lower back , has been followed in pain clinic, has  had injection s in cerv. area  . Blood dyscrasia    told that when he was young he was a" free bleeder"  . CAD (coronary artery disease)   . Cervical spondylosis without myelopathy 07/24/2014  . Cervicogenic headache 07/24/2014  . Chronic kidney disease    renal calculi- passed spontaneously  . Depression   . Diabetes mellitus without complication ([place])   . Fatty liver   . GERD (gastroesophageal reflux disease)   . Headache(784.0)   . Hyperlipidemia   . Hypertension   . Mental disorder   . MI, old   . RLS  (restless legs syndrome)    detected on sleep study  . Shortness of breath   . Ventricular fibrillation ([place]) 04/05/2016    Patient Active Problem List   Diagnosis Date Noted  . Substance induced mood disorder ([place]) 11/22/2018  . Alcohol abuse with alcohol-induced mood disorder ([place]) 09/21/2018  . Overdose 05/17/2018  . Depression, major, recurrent, severe with psychosis ([place]) 03/30/2018  . Personality disorder ([place]) 12/31/2017  . MDD (major depressive disorder), recurrent episode, severe ([place]) 11/20/2017  . Dysthymia 10/26/2017  . Generalized anxiety disorder 02/05/2017  . Chronic neck pain (Primary Area of Pain) (Left) 11/22/2016  . Cervical facet syndrome (Left) 11/22/2016  . Cervical radiculitis (Left) 11/22/2016  . History of cervical spinal surgery 11/22/2016  . Cervical spondylosis 11/22/2016  . Chronic shoulder pain (Secondary Area of Pain) (Left) 11/22/2016  . Arthropathy of shoulder (Left) 11/22/2016  . Chronic low back pain [facility] Area of Pain) (Bilateral) (L>R) 11/22/2016  . Lumbar facet syndrome (Bilateral) (L>R) 11/22/2016  . Chronic pain syndrome 11/21/2016  . Long term prescription benzodiazepine use 11/21/2016  . Opiate use 11/21/2016  . Uncontrolled type 2 diabetes mellitus without complication, without long-term current use of insulin 10/19/2016  . Chronic Suicidal ideation 09/15/2016  . Coronary artery disease 09/12/2016  . HTN (hypertension) 06/26/2016  . GERD (gastroesophageal reflux disease) 06/26/2016  . Alcohol use disorder, severe, 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[place]) 06/26/2016  .  Benzodiazepine abuse ([place]) 06/11/2016  . Sedative, hypnotic or anxiolytic use disorder, severe, dependence ([place]) 05/12/2016  . Ventricular fibrillation ([place]) 04/05/2016  . Combined hyperlipidemia 01/05/2016  . Cervical spondylosis without myelopathy 07/24/2014  . Severe episode of recurrent major depressive disorder, without psychotic features ([place]) 04/15/2013  . Tobacco use disorder  10/20/2010  . Asthma 10/20/2010    Past Surgical History:  Procedure Laterality Date  . ANTERIOR CERVICAL DECOMP/DISCECTOMY FUSION  11/13/2011   Procedure: ANTERIOR CERVICAL DECOMPRESSION/DISCECTOMY FUSION 1 LEVEL;  Surgeon: [name], MD;  Location: [place] [facility];  Service: Neurosurgery;  Laterality: N/A;  Anterior Cervical Decompression/discectomy Fusion. Cervical three-four.  Marland Kitchen CARDIAC CATHETERIZATION N/A 01/01/2015   Procedure: Left Heart Cath and Coronary Angiography;  Surgeon: [name], MD;  Location: [place] [facility];  Service: Cardiovascular;  Laterality: N/A;  . CARDIAC CATHETERIZATION N/A 04/05/2016   Procedure: Left Heart Cath and Coronary Angiography;  Surgeon: Belva Crome, MD;  Location: [place] [facility] LAB;  Service: Cardiovascular;  Laterality: N/A;  . CARDIAC CATHETERIZATION N/A 04/05/2016   Procedure: Coronary Stent Intervention;  Surgeon: Belva Crome, MD;  Location: [place] [facility] LAB;  Service: Cardiovascular;  Laterality: N/A;  . CARDIAC CATHETERIZATION N/A 04/05/2016   Procedure: Intravascular Ultrasound/IVUS;  Surgeon: Belva Crome, MD;  Location: [place] [facility] LAB;  Service: Cardiovascular;  Laterality: N/A;  . NASAL SINUS SURGERY     2005    Prior to Admission medications   Medication Sig Start Date End Date Taking? Authorizing Provider  albuterol (PROVENTIL HFA;VENTOLIN HFA) 108 (90 Base) MCG/ACT inhaler Inhale 2 puffs into the lungs every 6 (six) hours as needed for wheezing. 04/11/18 04/11/19  [provider]  amoxicillin-clavulanate (AUGMENTIN) 875-125 MG tablet Take 1 tablet by mouth 2 (two) times daily. Patient not taking: Reported on 01/15/2019 11/03/18   Menshew, Dannielle Karvonen, PA-C  ARIPiprazole (ABILIFY) 15 MG tablet Take 15 mg by mouth daily. 10/14/18   [provider]  aspirin 81 MG EC tablet Take 1 tablet (81 mg total) by mouth daily. For heart health 11/21/17   Lindell Spar I, NP  atorvastatin (LIPITOR) 10 MG tablet Take 10 mg by  mouth daily. 10/25/18 10/25/19  [provider]  busPIRone (BUSPAR) 10 MG tablet Take 10 mg by mouth 3 (three) times daily. 09/16/18   [provider]  carbamazepine (TEGRETOL) 200 MG tablet Take 200 mg by mouth 2 (two) times daily. 09/16/18   [provider]  carvedilol (COREG) 12.5 MG tablet Take 12.5 mg by mouth 2 (two) times daily with a meal. 10/25/18 10/25/19  [provider]  clonazePAM (KLONOPIN) 0.5 MG tablet Take 0.5 mg by mouth 2 (two) times daily. 05/23/18   [provider]  cloNIDine (CATAPRES) 0.1 MG tablet Take 0.1 mg by mouth 3 (three) times daily. 05/16/18   [provider]  clopidogrel (PLAVIX) 75 MG tablet Take 75 mg by mouth daily. 03/12/18 03/12/19  [provider]  DULoxetine (CYMBALTA) 20 MG capsule Take 20 mg by mouth daily. 08/05/18   [provider]  gabapentin (NEURONTIN) 300 MG capsule Take 3 capsules (900 mg total) by mouth 3 (three) times daily. 04/01/18   Clapacs, Madie Reno, MD  glipiZIDE (GLUCOTROL XL) 5 MG 24 hr tablet Take 5 mg by mouth daily. 10/15/18 10/15/19  [provider]  ibuprofen (ADVIL,MOTRIN) 800 MG tablet Take 2,400 mg by mouth 3 (three) times daily as needed.    [provider]  ketorolac (TORADOL) 10  MG tablet Take 1 tablet (10 mg total) by mouth every 8 (eight) hours. Patient not taking: Reported on 11/05/2018 11/03/18   Menshew, Dannielle Karvonen, PA-C  lamoTRIgine (LAMICTAL) 150 MG tablet Take 150 mg by mouth daily. 07/17/18   [provider]  lisinopril (ZESTRIL) 10 MG tablet Take 10 mg by mouth daily. 10/15/18 10/15/19  [provider]  LORazepam (ATIVAN) 2 MG tablet Take 2 mg by mouth 3 (three) times daily. 10/02/18   [provider]  metFORMIN (GLUCOPHAGE) 1000 MG tablet Take 1,000 mg by mouth 2 (two) times daily. 10/02/18   [provider]  mirtazapine (REMERON) 30 MG tablet Take 30 mg by mouth Nightly. 10/14/18   [provider]   nitroGLYCERIN (NITROSTAT) 0.4 MG SL tablet Place 0.4 mg under the tongue as needed. 06/03/18   [provider]  ondansetron (ZOFRAN ODT) 4 MG disintegrating tablet Take 1 tablet (4 mg total) by mouth every 8 (eight) hours as needed for nausea or vomiting. 11/20/18   Nena Polio, MD  oxyCODONE-acetaminophen (PERCOCET) 5-325 MG tablet Take 1 tablet by mouth every 4 (four) hours as needed for severe pain. 11/20/18 11/20/19  Nena Polio, MD  QUEtiapine (SEROQUEL) 100 MG tablet Take 1 tablet (100 mg total) by mouth at bedtime. 11/22/18   Merlyn Lot, MD  REXULTI 3 MG TABS Take 3 mg by mouth daily. 09/03/18   [provider]  sildenafil (REVATIO) 20 MG tablet Take 1 tablet (20 mg total) by mouth daily as needed. 10/15/18   Billey Co, MD    Allergies Prednisone, Amoxicillin, Hydrocodone, Varenicline, and Trazodone and nefazodone  Family History  Problem Relation Age of Onset  . Prostate cancer Father   . Hypertension Mother   . Kidney Stones Mother   . Anxiety disorder Mother   . Depression Mother   . COPD Sister   . Hypertension Sister   . Diabetes Sister   . Depression Sister   . Anxiety disorder Sister   . Seizures Sister   . ADD / ADHD Son   . ADD / ADHD Daughter     Social History Social History   Tobacco Use  . Smoking status: Current Every Day Smoker    Packs/day: 2.00    Years: 17.00    Pack years: 34.00    Types: Cigarettes, Cigars  . Smokeless tobacco: Former Systems developer  . Tobacco comment: occassional snuff   Substance Use Topics  . Alcohol use: No    Alcohol/week: 0.0 standard drinks    Comment: last drinked in 3 months.   . Drug use: No    Review of Systems Constitutional: No fever/chills just feels fatigued, feeling better after arriving to the hospital Eyes: No visual changes. ENT: No sore throat. Cardiovascular: Denies chest pain. Respiratory: Denies shortness of breath. Gastrointestinal: No abdominal pain.   Musculoskeletal:  Negative for back pain. Neurological: Negative for headaches.  Felt just tired and weak all over.  Seems to getting better.  Does state that he fell several times due to weakness but did not strike his head or neck.  And  ____________________________________________   PHYSICAL EXAM:  VITAL SIGNS: ED Triage Vitals  Enc Vitals Group     BP 01/15/19 1741 90/62     Pulse Rate 01/15/19 1734 77     Resp 01/15/19 1734 20     Temp 01/15/19 1734 97.9 F (36.6 C)     Temp Source 01/15/19 1734 Oral     SpO2  01/15/19 1734 97 %     Weight 01/15/19 1736 209 lb 7 oz (95 kg)     Height 01/15/19 1736 6\' 2"  (1.88 m)     Head Circumference --      Peak Flow --      Pain Score 01/15/19 1735 3     Pain Loc --      Pain Edu? --      Excl. in [place]? --     Constitutional: Alert and oriented. Well appearing and in no acute distress.  He is fully alert and oriented. Eyes: Conjunctivae are normal. Head: Atraumatic. Nose: No congestion/rhinnorhea. Mouth/Throat: Mucous membranes are dry. Neck: No stridor.  He does report some chronic neck pain but denies any fall or injury.  Denies hitting his head. Cardiovascular: Normal rate, regular rhythm. Grossly normal heart sounds.  Good peripheral circulation. Respiratory: Normal respiratory effort.  No retractions. Lungs CTAB. Gastrointestinal: Soft and nontender. No distention. Musculoskeletal: No lower extremity tenderness nor edema.  Moves all extremities well.  There is no pronator drift in any extremity.  Symmetric smile.  Clear speech and tone. Neurologic:  Normal speech and language. No gross focal neurologic deficits are appreciated.  Skin:  Skin is warm, dry and intact. No rash noted. Psychiatric: Mood and affect are somewhat labile, denies desire to harm himself or anyone else.  ____________________________________________   LABS (all labs ordered are listed, but only abnormal results are displayed)  Labs Reviewed  CBC - Abnormal; Notable for  the following components:      Result Value   RBC 4.14 (*)    Hemoglobin 12.7 (*)    All other components within normal limits  COMPREHENSIVE METABOLIC PANEL - Abnormal; Notable for the following components:   Glucose, Bld 117 (*)    Creatinine, Ser 1.81 (*)    Calcium 8.8 (*)    Total Protein 6.1 (*)    GFR calc non Af Amer 44 (*)    GFR calc Af Amer 51 (*)    All other components within normal limits  URINE DRUG SCREEN, QUALITATIVE ([facility] ONLY) - Abnormal; Notable for the following components:   Tricyclic, Ur Screen POSITIVE (*)    Amphetamines, Ur Screen POSITIVE (*)    Benzodiazepine, Ur Scrn POSITIVE (*)    All other components within normal limits  AMMONIA - Abnormal; Notable for the following components:   Ammonia <9 (*)    All other components within normal limits  SARS CORONAVIRUS 2 (TAT 6-24 HRS)  ETHANOL  LACTIC ACID, PLASMA   ____________________________________________  EKG  Reviewed interpreted by me at 1735 Heart rate 80 QRS 100 QTc 460 Normal sinus rhythm, no evidence of acute ischemia ____________________________________________  RADIOLOGY  Ct Head Wo Contrast  Result Date: 01/15/2019 CLINICAL DATA:  Head trauma. Multiple falls over the last several days. Weakness. EXAM: CT HEAD WITHOUT CONTRAST TECHNIQUE: Contiguous axial images were obtained from the base of the skull through the vertex without intravenous contrast. COMPARISON:  CT scan dated 11/05/2018 FINDINGS: Brain: No evidence of acute infarction, hemorrhage, hydrocephalus, extra-axial collection or mass lesion/mass effect. Vascular: No hyperdense vessel or unexpected calcification. Skull: Normal. Negative for fracture or focal lesion. Sinuses/Orbits: Slight chronic mucosal changes in the frontal sinus. Orbits are normal. Other: None IMPRESSION: No significant abnormalities.  No change since the prior study. Electronically Signed   By: Lorriane Shire M.D.   On: 01/15/2019 18:42    CT head reviewed  negative for acute ____________________________________________   PROCEDURES  Procedure(s) performed: None  Procedures  Critical Care performed: No  ____________________________________________   INITIAL IMPRESSION / ASSESSMENT AND PLAN / ED COURSE  Pertinent labs & imaging results that were available during my care of the patient were reviewed by me and considered in my medical decision making (see chart for details).   Patient resents for generalized fatigue, weakness, multiple falls but denies injury.  Initially hypotensive with EMS, reports improvement after receiving IV fluids.  Etiology somewhat unclear, he does appear dry, dry mucous membranes, and he reports improvement with IV fluids.  He also does report starting new medications as recommended during his psychiatric stay, he does have a long history of psychiatric disease, but also has known medical disease as well.  No evidence of acute ST elevation or acute cardiac etiology.  Head CT reassuring.  Neurologic exam nonfocal, overall appears somewhat fatigued but otherwise no acute abnormality noted.  After receiving fluids his blood pressure improved, patient requesting to ambulate, getting up from bed, telling me that he does not wish to stay any longer and that his family is coming to pick him up.  He refused further work-up or care though he was initially planned to be admitted for what appears to be likely dehydration with associated hypotension but the true etiology is not yet clear.  I discussed with him there is risk of leaving, he could become injured, his condition could worsen, risk include all the way up to death. Patient refused further evaluation, he is awake alert fully oriented able to state risk and appears to have capacity to make this decision.  Patient signed out [place].      ____________________________________________   FINAL CLINICAL IMPRESSION(S) / ED DIAGNOSES  Final diagnoses:  Weakness   Dehydration, moderate        Note:  This document was prepared using Dragon voice recognition software and may include unintentional dictation errors       Delman Kitten, MD 01/16/19 1648

## 2019-01-20 DIAGNOSIS — R296 Repeated falls: Secondary | ICD-10-CM | POA: Diagnosis not present

## 2019-01-20 DIAGNOSIS — Z79899 Other long term (current) drug therapy: Secondary | ICD-10-CM | POA: Diagnosis not present

## 2019-01-21 ENCOUNTER — Other Ambulatory Visit: Payer: Self-pay

## 2019-01-21 ENCOUNTER — Emergency Department: Payer: Medicare Other

## 2019-01-21 ENCOUNTER — Emergency Department
Admission: EM | Admit: 2019-01-21 | Discharge: 2019-01-21 | Disposition: A | Discharge status: Home / Self Care | Payer: Medicare Other | Attending: Emergency Medicine | Admitting: Emergency Medicine

## 2019-01-21 DIAGNOSIS — E119 Type 2 diabetes mellitus without complications: Secondary | ICD-10-CM | POA: Diagnosis not present

## 2019-01-21 DIAGNOSIS — Z79899 Other long term (current) drug therapy: Secondary | ICD-10-CM | POA: Insufficient documentation

## 2019-01-21 DIAGNOSIS — Z046 Encounter for general psychiatric examination, requested by authority: Secondary | ICD-10-CM | POA: Insufficient documentation

## 2019-01-21 DIAGNOSIS — J45909 Unspecified asthma, uncomplicated: Secondary | ICD-10-CM | POA: Diagnosis not present

## 2019-01-21 DIAGNOSIS — R7989 Other specified abnormal findings of blood chemistry: Secondary | ICD-10-CM | POA: Diagnosis not present

## 2019-01-21 DIAGNOSIS — I2510 Atherosclerotic heart disease of native coronary artery without angina pectoris: Secondary | ICD-10-CM | POA: Insufficient documentation

## 2019-01-21 DIAGNOSIS — I252 Old myocardial infarction: Secondary | ICD-10-CM | POA: Insufficient documentation

## 2019-01-21 DIAGNOSIS — F17210 Nicotine dependence, cigarettes, uncomplicated: Secondary | ICD-10-CM | POA: Insufficient documentation

## 2019-01-21 DIAGNOSIS — Z7982 Long term (current) use of aspirin: Secondary | ICD-10-CM | POA: Insufficient documentation

## 2019-01-21 DIAGNOSIS — I10 Essential (primary) hypertension: Secondary | ICD-10-CM | POA: Insufficient documentation

## 2019-01-21 DIAGNOSIS — R7889 Finding of other specified substances, not normally found in blood: Secondary | ICD-10-CM | POA: Diagnosis not present

## 2019-01-21 DIAGNOSIS — F329 Major depressive disorder, single episode, unspecified: Secondary | ICD-10-CM | POA: Insufficient documentation

## 2019-01-21 DIAGNOSIS — R000 Tachycardia, unspecified: Secondary | ICD-10-CM | POA: Diagnosis not present

## 2019-01-21 LAB — COMPREHENSIVE METABOLIC PANEL
ALT: 22 U/L (ref 0–44)
AST: 24 U/L (ref 15–41)
Albumin: 4.8 g/dL (ref 3.5–5.0)
Alkaline Phosphatase: 131 U/L — ABNORMAL HIGH (ref 38–126)
Anion gap: 19 — ABNORMAL HIGH (ref 5–15)
BUN: 13 mg/dL (ref 6–20)
CO2: 14 mmol/L — ABNORMAL LOW (ref 22–32)
Calcium: 9.9 mg/dL (ref 8.9–10.3)
Chloride: 103 mmol/L (ref 98–111)
Creatinine, Ser: 1.43 mg/dL — ABNORMAL HIGH (ref 0.61–1.24)
GFR calc Af Amer: >60 mL/min (ref >60)
GFR calc non Af Amer: 58 mL/min — ABNORMAL LOW (ref >60)
Glucose, Bld: 166 mg/dL — ABNORMAL HIGH (ref 70–99)
Potassium: 3.8 mmol/L (ref 3.5–5.1)
Sodium: 136 mmol/L (ref 135–145)
Total Bilirubin: 1.4 mg/dL — ABNORMAL HIGH (ref 0.3–1.2)
Total Protein: 8 g/dL (ref 6.5–8.1)

## 2019-01-21 LAB — CBC WITH DIFFERENTIAL/PLATELET
Abs Immature Granulocytes: 0.1 10*3/uL — ABNORMAL HIGH (ref 0.00–0.07)
Basophils Absolute: 0.1 10*3/uL (ref 0.0–0.1)
Basophils Relative: 1 %
Eosinophils Absolute: 0 10*3/uL (ref 0.0–0.5)
Eosinophils Relative: 0 %
HCT: 43.7 % (ref 39.0–52.0)
Hemoglobin: 14.7 g/dL (ref 13.0–17.0)
Immature Granulocytes: 1 %
Lymphocytes Relative: 10 %
Lymphs Abs: 1.7 10*3/uL (ref 0.7–4.0)
MCH: 31.2 pg (ref 26.0–34.0)
MCHC: 33.6 g/dL (ref 30.0–36.0)
MCV: 92.8 fL (ref 80.0–100.0)
Monocytes Absolute: 1.4 10*3/uL — ABNORMAL HIGH (ref 0.1–1.0)
Monocytes Relative: 8 %
Neutro Abs: 14 10*3/uL — ABNORMAL HIGH (ref 1.7–7.7)
Neutrophils Relative %: 80 %
Platelets: 422 10*3/uL — ABNORMAL HIGH (ref 150–400)
RBC: 4.71 MIL/uL (ref 4.22–5.81)
RDW: 14 % (ref 11.5–15.5)
WBC: 17.3 10*3/uL — ABNORMAL HIGH (ref 4.0–10.5)
nRBC: 0.1 % (ref 0.0–0.2)

## 2019-01-21 LAB — URINALYSIS, COMPLETE (UACMP) WITH MICROSCOPIC
Bacteria, UA: NONE SEEN
Bilirubin Urine: NEGATIVE
Glucose, UA: NEGATIVE mg/dL
Hgb urine dipstick: NEGATIVE
Ketones, ur: 20 mg/dL — AB
Leukocytes,Ua: NEGATIVE
Nitrite: NEGATIVE
Protein, ur: 100 mg/dL — AB
Specific Gravity, Urine: 1.023 (ref 1.005–1.030)
pH: 5 (ref 5.0–8.0)

## 2019-01-21 LAB — CARBAMAZEPINE LEVEL, TOTAL: Carbamazepine Lvl: <2 ug/mL — ABNORMAL LOW (ref 4.0–12.0)

## 2019-01-21 LAB — LITHIUM LEVEL: Lithium Lvl: 1.46 mmol/L — ABNORMAL HIGH (ref 0.60–1.20)

## 2019-01-21 MED ORDER — HALOPERIDOL LACTATE 5 MG/ML IJ SOLN
5.0000 mg | Freq: Once | INTRAMUSCULAR | Status: AC
  Administered 2019-01-21: 5 mg via INTRAMUSCULAR
  Filled 2019-01-21: qty 1

## 2019-01-21 MED ORDER — LORAZEPAM 2 MG/ML IJ SOLN
2.0000 mg | Freq: Once | INTRAMUSCULAR | Status: AC
  Administered 2019-01-21: 15:00:00 2 mg via INTRAMUSCULAR
  Filled 2019-01-21: qty 1

## 2019-01-21 MED ORDER — SODIUM CHLORIDE 0.9 % IV BOLUS
1000.0000 mL | Freq: Once | INTRAVENOUS | Status: AC
  Administered 2019-01-21: 16:00:00 1000 mL via INTRAVENOUS

## 2019-01-21 NOTE — ED Triage Notes (Signed)
Patient from [facility], sent to ed with abnormal lithium levels. Patient with inappropriate behavior loud and non cooperative. Denies pain/ shortness of breath.

## 2019-01-21 NOTE — ED Provider Notes (Signed)
[facility] Emergency Department Provider Note   ____________________________________________   First MD Initiated Contact with Patient 01/21/19 1513     (approximate)  I have reviewed the triage vital signs and the nursing notes.   HISTORY  Chief Complaint Abnormal Lab History limited by altered mental status   HPI Michael Schmitt is a 48 y.o. male sent from presents for evaluation of an abnormal lithium level.  Patient is yelling help help get me out of here.  He is unable to give any good history at all.  He is stable on his feet but his head is bobbing fairly rhythmically         Past Medical History:  Diagnosis Date  . Anxiety   . Anxiety   . Arthritis    cerv. stenosis, spondylosis, HNP- lower back , has been followed in pain clinic, has  had injection s in cerv. area  . Blood dyscrasia    told that when he was young he was a" free bleeder"  . CAD (coronary artery disease)   . Cervical spondylosis without myelopathy 07/24/2014  . Cervicogenic headache 07/24/2014  . Chronic kidney disease    renal calculi- passed spontaneously  . Depression   . Diabetes mellitus without complication ([place])   . Fatty liver   . GERD (gastroesophageal reflux disease)   . Headache(784.0)   . Hyperlipidemia   . Hypertension   . Mental disorder   . MI, old   . RLS (restless legs syndrome)    detected on sleep study  . Shortness of breath   . Ventricular fibrillation ([place]) 04/05/2016    Patient Active Problem List   Diagnosis Date Noted  . Substance induced mood disorder ([place]) 11/22/2018  . Alcohol abuse with alcohol-induced mood disorder ([place]) 09/21/2018  . Overdose 05/17/2018  . Depression, major, recurrent, severe with psychosis ([place]) 03/30/2018  . Personality disorder ([place]) 12/31/2017  . MDD (major depressive disorder), recurrent episode, severe ([place]) 11/20/2017  . Dysthymia 10/26/2017  . Generalized anxiety disorder 02/05/2017  . Chronic  neck pain (Primary Area of Pain) (Left) 11/22/2016  . Cervical facet syndrome (Left) 11/22/2016  . Cervical radiculitis (Left) 11/22/2016  . History of cervical spinal surgery 11/22/2016  . Cervical spondylosis 11/22/2016  . Chronic shoulder pain (Secondary Area of Pain) (Left) 11/22/2016  . Arthropathy of shoulder (Left) 11/22/2016  . Chronic low back pain [facility] Area of Pain) (Bilateral) (L>R) 11/22/2016  . Lumbar facet syndrome (Bilateral) (L>R) 11/22/2016  . Chronic pain syndrome 11/21/2016  . Long term prescription benzodiazepine use 11/21/2016  . Opiate use 11/21/2016  . Uncontrolled type 2 diabetes mellitus without complication, without long-term current use of insulin 10/19/2016  . Chronic Suicidal ideation 09/15/2016  . Coronary artery disease 09/12/2016  . HTN (hypertension) 06/26/2016  . GERD (gastroesophageal reflux disease) 06/26/2016  . Alcohol use disorder, severe, 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[place]) 06/26/2016  . Benzodiazepine abuse ([place]) 06/11/2016  . Sedative, hypnotic or anxiolytic use disorder, severe, dependence ([place]) 05/12/2016  . Ventricular fibrillation ([place]) 04/05/2016  . Combined hyperlipidemia 01/05/2016  . Cervical spondylosis without myelopathy 07/24/2014  . Severe episode of recurrent major depressive disorder, without psychotic features ([place]) 04/15/2013  . Tobacco use disorder 10/20/2010  . Asthma 10/20/2010    Past Surgical History:  Procedure Laterality Date  . ANTERIOR CERVICAL DECOMP/DISCECTOMY FUSION  11/13/2011   Procedure: ANTERIOR CERVICAL DECOMPRESSION/DISCECTOMY FUSION 1 LEVEL;  Surgeon: [name], MD;  Location: [place] NEURO ORS;  Service: Neurosurgery;  Laterality:  N/A;  Anterior Cervical Decompression/discectomy Fusion. Cervical three-four.  Marland Kitchen CARDIAC CATHETERIZATION N/A 01/01/2015   Procedure: Left Heart Cath and Coronary Angiography;  Surgeon: [name], MD;  Location: [place] CV LAB;  Service: Cardiovascular;  Laterality: N/A;  . CARDIAC  CATHETERIZATION N/A 04/05/2016   Procedure: Left Heart Cath and Coronary Angiography;  Surgeon: Belva Crome, MD;  Location: [place] CV LAB;  Service: Cardiovascular;  Laterality: N/A;  . CARDIAC CATHETERIZATION N/A 04/05/2016   Procedure: Coronary Stent Intervention;  Surgeon: Belva Crome, MD;  Location: [place] CV LAB;  Service: Cardiovascular;  Laterality: N/A;  . CARDIAC CATHETERIZATION N/A 04/05/2016   Procedure: Intravascular Ultrasound/IVUS;  Surgeon: Belva Crome, MD;  Location: [place] CV LAB;  Service: Cardiovascular;  Laterality: N/A;  . NASAL SINUS SURGERY     2005    Prior to Admission medications   Medication Sig Start Date End Date Taking? Authorizing Provider  albuterol (PROVENTIL HFA;VENTOLIN HFA) 108 (90 Base) MCG/ACT inhaler Inhale 2 puffs into the lungs every 6 (six) hours as needed for wheezing. 04/11/18 04/11/19  [provider]  amoxicillin-clavulanate (AUGMENTIN) 875-125 MG tablet Take 1 tablet by mouth 2 (two) times daily. Patient not taking: Reported on 01/15/2019 11/03/18   Menshew, Dannielle Karvonen, PA-C  ARIPiprazole (ABILIFY) 15 MG tablet Take 15 mg by mouth daily. 10/14/18   [provider]  aspirin 81 MG EC tablet Take 1 tablet (81 mg total) by mouth daily. For heart health 11/21/17   Lindell Spar I, NP  atorvastatin (LIPITOR) 10 MG tablet Take 10 mg by mouth daily. 10/25/18 10/25/19  [provider]  busPIRone (BUSPAR) 10 MG tablet Take 10 mg by mouth 3 (three) times daily. 09/16/18   [provider]  carbamazepine (TEGRETOL) 200 MG tablet Take 200 mg by mouth 2 (two) times daily. 09/16/18   [provider]  carvedilol (COREG) 12.5 MG tablet Take 12.5 mg by mouth 2 (two) times daily with a meal. 10/25/18 10/25/19  [provider]  clonazePAM (KLONOPIN) 0.5 MG tablet Take 0.5 mg by mouth 2 (two) times daily. 05/23/18   [provider]  cloNIDine (CATAPRES) 0.1 MG tablet Take 0.1 mg by mouth 3 (three)  times daily. 05/16/18   [provider]  clopidogrel (PLAVIX) 75 MG tablet Take 75 mg by mouth daily. 03/12/18 03/12/19  [provider]  DULoxetine (CYMBALTA) 20 MG capsule Take 20 mg by mouth daily. 08/05/18   [provider]  gabapentin (NEURONTIN) 300 MG capsule Take 3 capsules (900 mg total) by mouth 3 (three) times daily. 04/01/18   Clapacs, Madie Reno, MD  glipiZIDE (GLUCOTROL XL) 5 MG 24 hr tablet Take 5 mg by mouth daily. 10/15/18 10/15/19  [provider]  ibuprofen (ADVIL,MOTRIN) 800 MG tablet Take 2,400 mg by mouth 3 (three) times daily as needed.    [provider]  ketorolac (TORADOL) 10 MG tablet Take 1 tablet (10 mg total) by mouth every 8 (eight) hours. Patient not taking: Reported on 11/05/2018 11/03/18   Menshew, Dannielle Karvonen, PA-C  lamoTRIgine (LAMICTAL) 150 MG tablet Take 150 mg by mouth daily. 07/17/18   [provider]  lisinopril (ZESTRIL) 10 MG tablet Take 10 mg by mouth daily. 10/15/18 10/15/19  [provider]  LORazepam (ATIVAN) 2 MG tablet Take 2 mg by mouth 3 (three) times daily. 10/02/18   [provider]  metFORMIN (GLUCOPHAGE) 1000 MG tablet Take 1,000 mg by mouth 2 (two) times daily. 10/02/18  [provider]  mirtazapine (REMERON) 30 MG tablet Take 30 mg by mouth Nightly. 10/14/18   [provider]  nitroGLYCERIN (NITROSTAT) 0.4 MG SL tablet Place 0.4 mg under the tongue as needed. 06/03/18   [provider]  ondansetron (ZOFRAN ODT) 4 MG disintegrating tablet Take 1 tablet (4 mg total) by mouth every 8 (eight) hours as needed for nausea or vomiting. 11/20/18   Nena Polio, MD  oxyCODONE-acetaminophen (PERCOCET) 5-325 MG tablet Take 1 tablet by mouth every 4 (four) hours as needed for severe pain. 11/20/18 11/20/19  Nena Polio, MD  QUEtiapine (SEROQUEL) 100 MG tablet Take 1 tablet (100 mg total) by mouth at bedtime. 11/22/18   Merlyn Lot, MD  REXULTI 3 MG TABS Take 3 mg by  mouth daily. 09/03/18   [provider]  sildenafil (REVATIO) 20 MG tablet Take 1 tablet (20 mg total) by mouth daily as needed. 10/15/18   Billey Co, MD    Allergies Prednisone, Amoxicillin, Hydrocodone, Varenicline, and Trazodone and nefazodone  Family History  Problem Relation Age of Onset  . Prostate cancer Father   . Hypertension Mother   . Kidney Stones Mother   . Anxiety disorder Mother   . Depression Mother   . COPD Sister   . Hypertension Sister   . Diabetes Sister   . Depression Sister   . Anxiety disorder Sister   . Seizures Sister   . ADD / ADHD Son   . ADD / ADHD Daughter     Social History Social History   Tobacco Use  . Smoking status: Current Every Day Smoker    Packs/day: 2.00    Years: 17.00    Pack years: 34.00    Types: Cigarettes, Cigars  . Smokeless tobacco: Former Systems developer  . Tobacco comment: occassional snuff   Substance Use Topics  . Alcohol use: No    Alcohol/week: 0.0 standard drinks    Comment: last drinked in 3 months.   . Drug use: No    Review of Systems Unable to obtain ____________________________________________   PHYSICAL EXAM:  VITAL SIGNS: ED Triage Vitals  Enc Vitals Group     BP 01/21/19 1440 (!) 153/94     Pulse Rate 01/21/19 1440 (!) 112     Resp 01/21/19 1440 20     Temp 01/21/19 1440 98.5 F (36.9 C)     Temp Source 01/21/19 1440 Oral     SpO2 01/21/19 1440 100 %     Weight 01/21/19 1441 198 lb (89.8 kg)     Height 01/21/19 1441 6\' 6"  (1.981 m)     Head Circumference --      Peak Flow --      Pain Score 01/21/19 1440 0     Pain Loc --      Pain Edu? --      Excl. in [place]? --     Constitutional: Alert alert yelling and screaming that he wants to go home Eyes: Conjunctivae are normal.  Head: Atraumatic. Nose: No congestion/rhinnorhea. Mouth/Throat: Mucous membranes are moist.  Oropharynx non-erythematous. Neck: No stridor. Cardiovascular: Normal rate, regular rhythm. Grossly normal heart sounds.   Good peripheral circulation. Respiratory: Normal respiratory effort.  No retractions. Lungs CTAB. Gastrointestinal: Soft and nontender. No distention. No abdominal bruits. No CVA tenderness. Musculoskeletal: No lower extremity tenderness nor edema.  No joint effusions. Neurologic:  Normal speech and language. No gross focal neurologic deficits are appreciated. Skin:  Skin is warm, dry and intact.  No rash noted. Psychiatric: Mood and affect are normal. Speech and behavior are normal. ____________________________________________   LABS (all labs ordered are listed, but only abnormal results are displayed)  Labs Reviewed  COMPREHENSIVE METABOLIC PANEL - Abnormal; Notable for the following components:      Result Value   CO2 14 (*)    Glucose, Bld 166 (*)    Creatinine, Ser 1.43 (*)    Alkaline Phosphatase 131 (*)    Total Bilirubin 1.4 (*)    GFR calc non Af Amer 58 (*)    Anion gap 19 (*)    All other components within normal limits  CBC WITH DIFFERENTIAL/PLATELET - Abnormal; Notable for the following components:   WBC 17.3 (*)    Platelets 422 (*)    Neutro Abs 14.0 (*)    Monocytes Absolute 1.4 (*)    Abs Immature Granulocytes 0.10 (*)    All other components within normal limits  LITHIUM LEVEL - Abnormal; Notable for the following components:   Lithium Lvl 1.46 (*)    All other components within normal limits  CARBAMAZEPINE LEVEL, TOTAL - Abnormal; Notable for the following components:   Carbamazepine Lvl <2.0 (*)    All other components within normal limits  URINALYSIS, COMPLETE (UACMP) WITH MICROSCOPIC   ____________________________________________  EKG  EKG done after he is medicated shows normal sinus rhythm rate of 96 rightward axis poor R wave progression no acute ST-T changes QTC is 492 ms ____________________________________________  RADIOLOGY  ED MD interpretation:    Official radiology report(s): Dg Chest Portable 1 View  Result Date: 01/21/2019  CLINICAL DATA:  Tachycardia and elevated white blood cell count EXAM: PORTABLE CHEST 1 VIEW COMPARISON:  March 09, 2018 FINDINGS: The heart size and mediastinal contours are stable. Both lungs are clear. The visualized skeletal structures are unremarkable. IMPRESSION: No active disease. Electronically Signed   By: Abelardo Diesel M.D.   On: 01/21/2019 17:27    ____________________________________________   PROCEDURES  Procedure(s) performed (including Critical Care):  Procedures   ____________________________________________   INITIAL IMPRESSION / ASSESSMENT AND PLAN / ED COURSE     After patient gets his shot he is much more compliant and able to give a good history.  He says he does take both the lithium and the Tegretol.  He has been getting them in jail he says.  His Tegretol level was 0 ago.  He understands that he is to skip the lithium today and restart it tomorrow.  He will take his lithium and Tegretol tomorrow.  He says he has a doctor to follow-up with and he can live at home.  The jail has let him go.  His chest x-ray is clear his urine is pending but it looks clear but concentrated.  His white count could possibly be because of his agitation.  He has not been febrile.  He does not have any chest pain or shortness of breath or dysuria or anything else.  I believe we can have him skip his lithium today and restart it tomorrow have him drink plenty fluids.  Have it rechecked again in a day or 2.   Ej Kittner was evaluated in Emergency Department on 01/21/2019 for the symptoms described in the history of present illness. He was evaluated in the context of the global COVID-19 pandemic, which necessitated consideration that the patient might be at risk for infection with the SARS-CoV-2 virus that causes COVID-19. Institutional protocols and algorithms that pertain to the evaluation of  patients at risk for COVID-19 are in a state of rapid change based on information released by  regulatory bodies including the CDC and federal and state organizations. These policies and algorithms were followed during the patient's care in the ED.       ____________________________________________   FINAL CLINICAL IMPRESSION(S) / ED DIAGNOSES  Final diagnoses:  Elevated lithium level     ED Discharge Orders    None       Note:  This document was prepared using Dragon voice recognition software and may include unintentional dictation errors.    Nena Polio, MD 01/21/19 [phone]

## 2019-01-21 NOTE — Discharge Instructions (Addendum)
Do not take your lithium today.  Take your Tegretol though.  Tomorrow you can take both the Tegretol and lithium.  Have your doctor check on you tomorrow or the day after which would be Thursday.  Ask them to draw a lithium level on you and a Tegretol level.  Make sure you are drinking plenty of fluids.  Right now your urine looks kind of dark. If you have any trouble getting your doctor to see you in the next 2 days please return here and tell them he needed a lithium level checked.

## 2019-01-21 NOTE — ED Notes (Signed)
Patient released form police custody and IVC'd in ED. Security watch for flight and safety.

## 2019-01-21 NOTE — ED Notes (Signed)
Labs drawn and sent -

## 2019-01-23 LAB — URINE CULTURE: Culture: <10000 — AB

## 2019-01-24 ENCOUNTER — Inpatient Hospital Stay
Admission: EM | Admit: 2019-01-24 | Discharge: 2019-01-26 | DRG: 641 | Disposition: A | Discharge status: Home / Self Care | Payer: Medicare Other | Attending: Internal Medicine | Admitting: Internal Medicine

## 2019-01-24 ENCOUNTER — Other Ambulatory Visit: Payer: Self-pay

## 2019-01-24 DIAGNOSIS — I129 Hypertensive chronic kidney disease with stage 1 through stage 4 chronic kidney disease, or unspecified chronic kidney disease: Secondary | ICD-10-CM | POA: Diagnosis present

## 2019-01-24 DIAGNOSIS — F331 Major depressive disorder, recurrent, moderate: Secondary | ICD-10-CM | POA: Diagnosis not present

## 2019-01-24 DIAGNOSIS — Z888 Allergy status to other drugs, medicaments and biological substances status: Secondary | ICD-10-CM | POA: Diagnosis not present

## 2019-01-24 DIAGNOSIS — Z981 Arthrodesis status: Secondary | ICD-10-CM | POA: Diagnosis not present

## 2019-01-24 DIAGNOSIS — I252 Old myocardial infarction: Secondary | ICD-10-CM

## 2019-01-24 DIAGNOSIS — Z885 Allergy status to narcotic agent status: Secondary | ICD-10-CM

## 2019-01-24 DIAGNOSIS — G4700 Insomnia, unspecified: Secondary | ICD-10-CM | POA: Diagnosis not present

## 2019-01-24 DIAGNOSIS — Z7984 Long term (current) use of oral hypoglycemic drugs: Secondary | ICD-10-CM

## 2019-01-24 DIAGNOSIS — Z825 Family history of asthma and other chronic lower respiratory diseases: Secondary | ICD-10-CM

## 2019-01-24 DIAGNOSIS — Z03818 Encounter for observation for suspected exposure to other biological agents ruled out: Secondary | ICD-10-CM | POA: Diagnosis not present

## 2019-01-24 DIAGNOSIS — Z8249 Family history of ischemic heart disease and other diseases of the circulatory system: Secondary | ICD-10-CM | POA: Diagnosis not present

## 2019-01-24 DIAGNOSIS — Z87442 Personal history of urinary calculi: Secondary | ICD-10-CM

## 2019-01-24 DIAGNOSIS — E871 Hypo-osmolality and hyponatremia: Secondary | ICD-10-CM | POA: Diagnosis not present

## 2019-01-24 DIAGNOSIS — E785 Hyperlipidemia, unspecified: Secondary | ICD-10-CM | POA: Diagnosis present

## 2019-01-24 DIAGNOSIS — T56891A Toxic effect of other metals, accidental (unintentional), initial encounter: Secondary | ICD-10-CM

## 2019-01-24 DIAGNOSIS — Z818 Family history of other mental and behavioral disorders: Secondary | ICD-10-CM

## 2019-01-24 DIAGNOSIS — R45851 Suicidal ideations: Secondary | ICD-10-CM | POA: Diagnosis not present

## 2019-01-24 DIAGNOSIS — Z7902 Long term (current) use of antithrombotics/antiplatelets: Secondary | ICD-10-CM

## 2019-01-24 DIAGNOSIS — R7989 Other specified abnormal findings of blood chemistry: Secondary | ICD-10-CM | POA: Diagnosis not present

## 2019-01-24 DIAGNOSIS — T43591A Poisoning by other antipsychotics and neuroleptics, accidental (unintentional), initial encounter: Secondary | ICD-10-CM | POA: Diagnosis not present

## 2019-01-24 DIAGNOSIS — E118 Type 2 diabetes mellitus with unspecified complications: Secondary | ICD-10-CM | POA: Diagnosis present

## 2019-01-24 DIAGNOSIS — F19188 Other psychoactive substance abuse with other psychoactive substance-induced disorder: Secondary | ICD-10-CM | POA: Diagnosis not present

## 2019-01-24 DIAGNOSIS — Z79899 Other long term (current) drug therapy: Secondary | ICD-10-CM

## 2019-01-24 DIAGNOSIS — E860 Dehydration: Secondary | ICD-10-CM | POA: Diagnosis not present

## 2019-01-24 DIAGNOSIS — I10 Essential (primary) hypertension: Secondary | ICD-10-CM | POA: Diagnosis not present

## 2019-01-24 DIAGNOSIS — K219 Gastro-esophageal reflux disease without esophagitis: Secondary | ICD-10-CM | POA: Diagnosis present

## 2019-01-24 DIAGNOSIS — Z833 Family history of diabetes mellitus: Secondary | ICD-10-CM | POA: Diagnosis not present

## 2019-01-24 DIAGNOSIS — I2510 Atherosclerotic heart disease of native coronary artery without angina pectoris: Secondary | ICD-10-CM | POA: Diagnosis not present

## 2019-01-24 DIAGNOSIS — Z880 Allergy status to penicillin: Secondary | ICD-10-CM

## 2019-01-24 DIAGNOSIS — F17210 Nicotine dependence, cigarettes, uncomplicated: Secondary | ICD-10-CM | POA: Diagnosis present

## 2019-01-24 DIAGNOSIS — G2581 Restless legs syndrome: Secondary | ICD-10-CM | POA: Diagnosis not present

## 2019-01-24 DIAGNOSIS — F419 Anxiety disorder, unspecified: Secondary | ICD-10-CM | POA: Diagnosis present

## 2019-01-24 DIAGNOSIS — Z8042 Family history of malignant neoplasm of prostate: Secondary | ICD-10-CM

## 2019-01-24 DIAGNOSIS — E119 Type 2 diabetes mellitus without complications: Secondary | ICD-10-CM | POA: Diagnosis not present

## 2019-01-24 DIAGNOSIS — Z7982 Long term (current) use of aspirin: Secondary | ICD-10-CM

## 2019-01-24 DIAGNOSIS — Z20828 Contact with and (suspected) exposure to other viral communicable diseases: Secondary | ICD-10-CM | POA: Diagnosis present

## 2019-01-24 DIAGNOSIS — R410 Disorientation, unspecified: Secondary | ICD-10-CM | POA: Diagnosis not present

## 2019-01-24 LAB — LITHIUM LEVEL: Lithium Lvl: 1.28 mmol/L — ABNORMAL HIGH (ref 0.60–1.20)

## 2019-01-24 LAB — COMPREHENSIVE METABOLIC PANEL
ALT: 21 U/L (ref 0–44)
AST: 20 U/L (ref 15–41)
Albumin: 4 g/dL (ref 3.5–5.0)
Alkaline Phosphatase: 117 U/L (ref 38–126)
Anion gap: 11 (ref 5–15)
BUN: 6 mg/dL (ref 6–20)
CO2: 24 mmol/L (ref 22–32)
Calcium: 10.2 mg/dL (ref 8.9–10.3)
Chloride: 91 mmol/L — ABNORMAL LOW (ref 98–111)
Creatinine, Ser: 0.94 mg/dL (ref 0.61–1.24)
GFR calc Af Amer: >60 mL/min (ref >60)
GFR calc non Af Amer: >60 mL/min (ref >60)
Glucose, Bld: 160 mg/dL — ABNORMAL HIGH (ref 70–99)
Potassium: 3.2 mmol/L — ABNORMAL LOW (ref 3.5–5.1)
Sodium: 126 mmol/L — ABNORMAL LOW (ref 135–145)
Total Bilirubin: 1.1 mg/dL (ref 0.3–1.2)
Total Protein: 7.1 g/dL (ref 6.5–8.1)

## 2019-01-24 LAB — URINE DRUG SCREEN, QUALITATIVE (ARMC ONLY)
Amphetamines, Ur Screen: POSITIVE — AB
Barbiturates, Ur Screen: NOT DETECTED
Benzodiazepine, Ur Scrn: NOT DETECTED
Cannabinoid 50 Ng, Ur ~~LOC~~: NOT DETECTED
Cocaine Metabolite,Ur ~~LOC~~: NOT DETECTED
MDMA (Ecstasy)Ur Screen: NOT DETECTED
Methadone Scn, Ur: NOT DETECTED
Opiate, Ur Screen: NOT DETECTED
Phencyclidine (PCP) Ur S: NOT DETECTED
Tricyclic, Ur Screen: NOT DETECTED

## 2019-01-24 LAB — ACETAMINOPHEN LEVEL: Acetaminophen (Tylenol), Serum: <10 ug/mL — ABNORMAL LOW (ref 10–30)

## 2019-01-24 LAB — SARS CORONAVIRUS 2 BY RT PCR (HOSPITAL ORDER, PERFORMED IN ~~LOC~~ HOSPITAL LAB): SARS Coronavirus 2: NEGATIVE

## 2019-01-24 LAB — CBC
HCT: 37 % — ABNORMAL LOW (ref 39.0–52.0)
Hemoglobin: 12.9 g/dL — ABNORMAL LOW (ref 13.0–17.0)
MCH: 31.4 pg (ref 26.0–34.0)
MCHC: 34.9 g/dL (ref 30.0–36.0)
MCV: 90 fL (ref 80.0–100.0)
Platelets: 299 10*3/uL (ref 150–400)
RBC: 4.11 MIL/uL — ABNORMAL LOW (ref 4.22–5.81)
RDW: 13.3 % (ref 11.5–15.5)
WBC: 13.3 10*3/uL — ABNORMAL HIGH (ref 4.0–10.5)
nRBC: 0 % (ref 0.0–0.2)

## 2019-01-24 LAB — CREATININE, URINE, RANDOM: Creatinine, Urine: 53 mg/dL

## 2019-01-24 LAB — SALICYLATE LEVEL: Salicylate Lvl: <7 mg/dL (ref 2.8–30.0)

## 2019-01-24 LAB — OSMOLALITY, URINE: Osmolality, Ur: 94 mOsm/kg — ABNORMAL LOW (ref 300–900)

## 2019-01-24 LAB — ETHANOL: Alcohol, Ethyl (B): <10 mg/dL (ref <10)

## 2019-01-24 LAB — SODIUM, URINE, RANDOM: Sodium, Ur: <10 mmol/L

## 2019-01-24 MED ORDER — ONDANSETRON HCL 4 MG PO TABS: 4.0000 mg | ORAL_TABLET | Freq: Four times a day (QID) | ORAL | Status: DC | PRN

## 2019-01-24 MED ORDER — BREXPIPRAZOLE 1 MG PO TABS
3.0000 mg | ORAL_TABLET | Freq: Every day | ORAL | Status: DC
  Administered 2019-01-25: 3 mg via ORAL
  Filled 2019-01-24 (×2): qty 3

## 2019-01-24 MED ORDER — SODIUM CHLORIDE 0.9 % IV BOLUS
500.0000 mL | Freq: Once | INTRAVENOUS | Status: AC
  Administered 2019-01-24: 19:00:00 500 mL via INTRAVENOUS

## 2019-01-24 MED ORDER — SODIUM CHLORIDE 0.9 % IV SOLN
INTRAVENOUS | Status: DC
  Administered 2019-01-25: 02:00:00 via INTRAVENOUS

## 2019-01-24 MED ORDER — ASPIRIN EC 81 MG PO TBEC
81.0000 mg | DELAYED_RELEASE_TABLET | Freq: Every day | ORAL | Status: DC
  Administered 2019-01-25 – 2019-01-26 (×3): 81 mg via ORAL
  Filled 2019-01-24 (×3): qty 1

## 2019-01-24 MED ORDER — ALBUTEROL SULFATE (2.5 MG/3ML) 0.083% IN NEBU: 2.5000 mg | INHALATION_SOLUTION | Freq: Four times a day (QID) | RESPIRATORY_TRACT | Status: DC | PRN

## 2019-01-24 MED ORDER — POTASSIUM CHLORIDE CRYS ER 20 MEQ PO TBCR
40.0000 meq | EXTENDED_RELEASE_TABLET | Freq: Once | ORAL | Status: AC
  Administered 2019-01-25: 40 meq via ORAL
  Filled 2019-01-24: qty 2

## 2019-01-24 MED ORDER — MIRTAZAPINE 15 MG PO TABS
30.0000 mg | ORAL_TABLET | Freq: Every day | ORAL | Status: DC
  Administered 2019-01-25: 21:00:00 30 mg via ORAL
  Filled 2019-01-24: qty 2

## 2019-01-24 MED ORDER — CLOPIDOGREL BISULFATE 75 MG PO TABS
75.0000 mg | ORAL_TABLET | Freq: Every day | ORAL | Status: DC
  Administered 2019-01-25 – 2019-01-26 (×2): 75 mg via ORAL
  Filled 2019-01-24 (×2): qty 1

## 2019-01-24 MED ORDER — ACETAMINOPHEN 325 MG PO TABS
650.0000 mg | ORAL_TABLET | Freq: Four times a day (QID) | ORAL | Status: DC | PRN
  Administered 2019-01-26: 650 mg via ORAL
  Filled 2019-01-24: qty 2

## 2019-01-24 MED ORDER — ARIPIPRAZOLE 15 MG PO TABS
15.0000 mg | ORAL_TABLET | Freq: Every day | ORAL | Status: DC
  Administered 2019-01-25: 15 mg via ORAL
  Filled 2019-01-24 (×2): qty 1

## 2019-01-24 MED ORDER — ONDANSETRON HCL 4 MG/2ML IJ SOLN
4.0000 mg | Freq: Four times a day (QID) | INTRAMUSCULAR | Status: DC | PRN
  Administered 2019-01-25: 4 mg via INTRAVENOUS
  Filled 2019-01-24: qty 2

## 2019-01-24 MED ORDER — ENOXAPARIN SODIUM 40 MG/0.4ML ~~LOC~~ SOLN
40.0000 mg | Freq: Every day | SUBCUTANEOUS | Status: DC
  Administered 2019-01-25 (×2): 40 mg via SUBCUTANEOUS
  Filled 2019-01-24 (×2): qty 0.4

## 2019-01-24 MED ORDER — ATORVASTATIN CALCIUM 20 MG PO TABS
10.0000 mg | ORAL_TABLET | Freq: Every day | ORAL | Status: DC
  Administered 2019-01-25: 18:00:00 10 mg via ORAL
  Filled 2019-01-24: qty 1

## 2019-01-24 MED ORDER — NITROGLYCERIN 0.4 MG SL SUBL: 0.4000 mg | SUBLINGUAL_TABLET | SUBLINGUAL | Status: DC | PRN

## 2019-01-24 MED ORDER — LISINOPRIL 10 MG PO TABS
10.0000 mg | ORAL_TABLET | Freq: Every day | ORAL | Status: DC
  Administered 2019-01-25 – 2019-01-26 (×2): 10 mg via ORAL
  Filled 2019-01-24 (×2): qty 1

## 2019-01-24 MED ORDER — QUETIAPINE FUMARATE 25 MG PO TABS
100.0000 mg | ORAL_TABLET | Freq: Every day | ORAL | Status: DC
  Administered 2019-01-25 (×2): 100 mg via ORAL
  Filled 2019-01-24 (×2): qty 4

## 2019-01-24 MED ORDER — CARVEDILOL 6.25 MG PO TABS
12.5000 mg | ORAL_TABLET | Freq: Two times a day (BID) | ORAL | Status: DC
  Administered 2019-01-25 – 2019-01-26 (×2): 12.5 mg via ORAL
  Filled 2019-01-24 (×2): qty 2

## 2019-01-24 MED ORDER — DULOXETINE HCL 20 MG PO CPEP
20.0000 mg | ORAL_CAPSULE | Freq: Every day | ORAL | Status: DC
  Administered 2019-01-25: 11:00:00 20 mg via ORAL
  Filled 2019-01-24 (×2): qty 1

## 2019-01-24 MED ORDER — NICOTINE 21 MG/24HR TD PT24
21.0000 mg | MEDICATED_PATCH | Freq: Every day | TRANSDERMAL | Status: DC
  Administered 2019-01-25 (×2): 21 mg via TRANSDERMAL
  Filled 2019-01-24 (×3): qty 1

## 2019-01-24 MED ORDER — LAMOTRIGINE 100 MG PO TABS
150.0000 mg | ORAL_TABLET | Freq: Every day | ORAL | Status: DC
  Administered 2019-01-25 – 2019-01-26 (×2): 150 mg via ORAL
  Filled 2019-01-24 (×2): qty 2

## 2019-01-24 MED ORDER — ACETAMINOPHEN 650 MG RE SUPP: 650.0000 mg | Freq: Four times a day (QID) | RECTAL | Status: DC | PRN

## 2019-01-24 NOTE — H&P (Addendum)
[facility] NAME: Michael Schmitt    MR#:  [id number]  DATE OF BIRTH:  1970-10-01  DATE OF ADMISSION:  01/24/2019  PRIMARY CARE PHYSICIAN: Shari Prows, [facility]   REQUESTING/REFERRING PHYSICIAN: Dr Marjean Donna  CHIEF COMPLAINT:   Chief Complaint  Patient presents with  . Altered Mental Status  . Hallucinations  . Suicidal    HISTORY OF PRESENT ILLNESS:  Michael Schmitt  is a 49 y.o. male coming into the hospital with suicidal ideation.  The patient is not the best historian.  Did not state that he has a plan.  He states that he has had some falls and cut his hand.  He lives with his sister mother and his 2 kids.  I did get permission to speak with his sister on the phone.  She states that he has been walking around naked in the house and she told him he could not do that because he has 2 kids.  She states that he has not been doing well and has been very confused.  Sister states that the patient has been hallucinating and is not himself and he needs help.  In the ER his sodium level was a little low and his lithium level was still little high.  ER physician did put an IVC paperwork in for psychiatric consultation.  PAST MEDICAL HISTORY:   Past Medical History:  Diagnosis Date  . Anxiety   . Anxiety   . Arthritis    cerv. stenosis, spondylosis, HNP- lower back , has been followed in pain clinic, has  had injection s in cerv. area  . Blood dyscrasia    told that when he was young he was a" free bleeder"  . CAD (coronary artery disease)   . Cervical spondylosis without myelopathy 07/24/2014  . Cervicogenic headache 07/24/2014  . Chronic kidney disease    renal calculi- passed spontaneously  . Depression   . Diabetes mellitus without complication ([place])   . Fatty liver   . GERD (gastroesophageal reflux disease)   . Headache(784.0)   . Hyperlipidemia   . Hypertension   . Mental disorder   . MI, old   . RLS  (restless legs syndrome)    detected on sleep study  . Shortness of breath   . Ventricular fibrillation ([place]) 04/05/2016    PAST SURGICAL HISTORY:   Past Surgical History:  Procedure Laterality Date  . ANTERIOR CERVICAL DECOMP/DISCECTOMY FUSION  11/13/2011   Procedure: ANTERIOR CERVICAL DECOMPRESSION/DISCECTOMY FUSION 1 LEVEL;  Surgeon: [name], MD;  Location: [facility];  Service: Neurosurgery;  Laterality: N/A;  Anterior Cervical Decompression/discectomy Fusion. Cervical three-four.  Marland Kitchen CARDIAC CATHETERIZATION N/A 01/01/2015   Procedure: Left Heart Cath and Coronary Angiography;  Surgeon: [name], MD;  Location: [facility];  Service: Cardiovascular;  Laterality: N/A;  . CARDIAC CATHETERIZATION N/A 04/05/2016   Procedure: Left Heart Cath and Coronary Angiography;  Surgeon: Belva Crome, MD;  Location: [facility];  Service: Cardiovascular;  Laterality: N/A;  . CARDIAC CATHETERIZATION N/A 04/05/2016   Procedure: Coronary Stent Intervention;  Surgeon: Belva Crome, MD;  Location: [facility];  Service: Cardiovascular;  Laterality: N/A;  . CARDIAC CATHETERIZATION N/A 04/05/2016   Procedure: Intravascular Ultrasound/IVUS;  Surgeon: Belva Crome, MD;  Location: [facility];  Service: Cardiovascular;  Laterality: N/A;  . NASAL SINUS SURGERY     2005    SOCIAL HISTORY:  Social History   Tobacco Use  . Smoking status: Current Every Day Smoker    Packs/day: 2.00    Years: 17.00    Pack years: 34.00    Types: Cigarettes, Cigars  . Smokeless tobacco: Former Systems developer  . Tobacco comment: occassional snuff   Substance Use Topics  . Alcohol use: No    Alcohol/week: 0.0 standard drinks    Comment: last drinked in 3 months.     FAMILY HISTORY:   Family History  Problem Relation Age of Onset  . Prostate cancer Father   . Hypertension Mother   . Kidney Stones Mother   . Anxiety disorder Mother   . Depression Mother   . COPD Sister   . Hypertension  Sister   . Diabetes Sister   . Depression Sister   . Anxiety disorder Sister   . Seizures Sister   . ADD / ADHD Son   . ADD / ADHD Daughter     DRUG ALLERGIES:   Allergies  Allergen Reactions  . Prednisone Other (See Comments)    Hypertension and makes him feel "spacey"  . Amoxicillin Other (See Comments)    Other Reaction: ELEVATED BP  . Hydrocodone Other (See Comments)    Causes headaches and makes the patient irritable  . Varenicline Other (See Comments)    Suicidal thoughts  . Trazodone And Nefazodone Anxiety and Other (See Comments)    Patient stated that this keeps him awake; "has an opposite effect on me"    REVIEW OF SYSTEMS:  CONSTITUTIONAL: No fever, fatigue or weakness.  EYES: No blurred or double vision.  EARS, NOSE, AND THROAT: No tinnitus or ear pain. No sore throat.  Some dysphagia to liquids RESPIRATORY: No cough, shortness of breath, wheezing or hemoptysis.  CARDIOVASCULAR: No chest pain, orthopnea, edema.  GASTROINTESTINAL: No nausea, vomiting, diarrhea or abdominal pain. No blood in bowel movements GENITOURINARY: No dysuria, hematuria.  ENDOCRINE: No polyuria, nocturia,  HEMATOLOGY: No anemia, easy bruising or bleeding SKIN: No rash or lesion. MUSCULOSKELETAL: No joint pain or arthritis.   NEUROLOGIC: Some weakness and falls PSYCHIATRY: History of anxiety  MEDICATIONS AT HOME:   Prior to Admission medications   Medication Sig Start Date End Date Taking? Authorizing Provider  albuterol (PROVENTIL HFA;VENTOLIN HFA) 108 (90 Base) MCG/ACT inhaler Inhale 2 puffs into the lungs every 6 (six) hours as needed for wheezing. 04/11/18 04/11/19  [provider]  ARIPiprazole (ABILIFY) 15 MG tablet Take 15 mg by mouth daily. 10/14/18   [provider]  aspirin 81 MG EC tablet Take 1 tablet (81 mg total) by mouth daily. For heart health Patient not taking: Reported on 01/24/2019 11/21/17   Lindell Spar I, NP  atorvastatin (LIPITOR) 10 MG tablet Take  10 mg by mouth daily. 10/25/18 10/25/19  [provider]  busPIRone (BUSPAR) 10 MG tablet Take 10 mg by mouth 3 (three) times daily. 09/16/18   [provider]  carbamazepine (TEGRETOL) 200 MG tablet Take 200 mg by mouth 2 (two) times daily. 09/16/18   [provider]  carvedilol (COREG) 12.5 MG tablet Take 12.5 mg by mouth 2 (two) times daily with a meal. 10/25/18 10/25/19  [provider]  cloNIDine (CATAPRES) 0.1 MG tablet Take 0.1 mg by mouth 3 (three) times daily. 05/16/18   [provider]  clopidogrel (PLAVIX) 75 MG tablet Take 75 mg by mouth daily. 03/12/18 03/12/19  [provider]  DULoxetine (CYMBALTA) 20 MG capsule Take 20 mg by mouth daily. 08/05/18  [provider]  gabapentin (NEURONTIN) 300 MG capsule Take 3 capsules (900 mg total) by mouth 3 (three) times daily. 04/01/18   Clapacs, Madie Reno, MD  glipiZIDE (GLUCOTROL XL) 5 MG 24 hr tablet Take 5 mg by mouth daily. 10/15/18 10/15/19  [provider]  lamoTRIgine (LAMICTAL) 150 MG tablet Take 150 mg by mouth daily. 07/17/18   [provider]  lisinopril (ZESTRIL) 10 MG tablet Take 10 mg by mouth daily. 10/15/18 10/15/19  [provider]  LORazepam (ATIVAN) 2 MG tablet Take 2 mg by mouth 3 (three) times daily. 10/02/18   [provider]  metFORMIN (GLUCOPHAGE) 1000 MG tablet Take 1,000 mg by mouth 2 (two) times daily. 10/02/18   [provider]  mirtazapine (REMERON) 30 MG tablet Take 30 mg by mouth Nightly. 10/14/18   [provider]  nitroGLYCERIN (NITROSTAT) 0.4 MG SL tablet Place 0.4 mg under the tongue as needed. 06/03/18   [provider]  QUEtiapine (SEROQUEL) 100 MG tablet Take 1 tablet (100 mg total) by mouth at bedtime. 11/22/18   Merlyn Lot, MD  REXULTI 3 MG TABS Take 3 mg by mouth daily. 09/03/18   [provider]  sildenafil (REVATIO) 20 MG tablet Take 1 tablet (20 mg total) by mouth daily as needed. 10/15/18    Billey Co, MD      VITAL SIGNS:  Blood pressure 136/72, pulse 82, temperature 98 F (36.7 C), temperature source Oral, resp. rate 17, height 6\' 2"  (1.88 m), weight 115.7 kg, SpO2 98 %.  PHYSICAL EXAMINATION:  GENERAL:  48 y.o.-year-old patient sitting in the bed with no acute distress.  EYES: Pupils equal, round, reactive to light and accommodation. No scleral icterus. Extraocular muscles intact.  HEENT: Head atraumatic, normocephalic. Oropharynx and nasopharynx clear.  NECK:  Supple, no jugular venous distention. No thyroid enlargement, no tenderness.  LUNGS: Normal breath sounds bilaterally, no wheezing, rales,rhonchi or crepitation. No use of accessory muscles of respiration.  CARDIOVASCULAR: S1, S2 normal. No murmurs, rubs, or gallops.  ABDOMEN: Soft, nontender, nondistended. Bowel sounds present. No organomegaly or mass.  EXTREMITIES: No pedal edema, cyanosis, or clubbing.  NEUROLOGIC: Cranial nerves II through XII are intact. Muscle strength 5/5 in all extremities. Sensation intact. Gait not checked.  PSYCHIATRIC: The patient is alert and answers some questions.  Keeps on twitching with his head and neck.  Hit himself in the head a couple times while he was thinking of questions. SKIN: Some cuts over his right arm and knuckles on his right hand  LABORATORY PANEL:   CBC Recent Labs  Lab 01/24/19 1656  WBC 13.3*  HGB 12.9*  HCT 37.0*  PLT 299   ------------------------------------------------------------------------------------------------------------------  Chemistries  Recent Labs  Lab 01/24/19 1656  NA 126*  K 3.2*  CL 91*  CO2 24  GLUCOSE 160*  BUN 6  CREATININE 0.94  CALCIUM 10.2  AST 20  ALT 21  ALKPHOS 117  BILITOT 1.1   ------------------------------------------------------------------------------------------------------------------   RADIOLOGY:  Chest x-ray from the other day negative  EKG:   Interpreted by me.  EKG from 01/21/2019 sinus  rhythm 97 bpm, Q waves laterally and inferiorly  IMPRESSION AND PLAN:   1.  Hyponatremia.  Patient denies drinking a lot of fluid.  Will give gentle normal saline and hold carbamazepine for right now.  Hopefully sodium will be better tomorrow. 2.  Slightly elevated lithium level.  Hold lithium.  IV fluid hydration. 3.  Suicidal ideation.  One-to-one watch.  ER  physician did IVC paperwork.  Psychiatric consultation.  Hold the high-dose Ativan.  Anticipate psychiatric admission once sodium better. 4.  Hypertension.  Hold clonidine and continue other medications. 5.  Type 2 diabetes mellitus.  Put on sliding scale and hold oral medications for right now 6.  Hyperlipidemia continue statin. 7.  Tobacco abuse.  Smoking cessation counseling done 4 minutes by me.  Nicotine patch ordered.    All the laboratory data and recent radiological and EKG were are reviewed and case discussed with ED provider. Management plans discussed with the patient, family and they are in agreement.  CODE STATUS: full code  TOTAL TIME TAKING CARE OF THIS PATIENT: 50 minutes.    Loletha Grayer M.D on 01/24/2019 at 7:03 PM  Between 7am to 6pm - Pager - [phone]  After 6pm call admission pager [phone]  [facility]  [phone]  CC: Primary care physician; [facility]

## 2019-01-24 NOTE — ED Notes (Signed)
Dr Wieting at bedside 

## 2019-01-24 NOTE — ED Notes (Signed)
Pt standing in room screaming "GD" every few seconds, this tech in with pt and asking him to please stop yelling and cussing. Pt stating "I got to get the hell out of here" this tech explained to pt that he was being admitted and that he would not be able to leave right now.

## 2019-01-24 NOTE — ED Triage Notes (Signed)
Pt sister reports that pt has altered mental status x2 weeks - he was discharged [facility]14 from [facility]mental hospital" Pt has loose associations, incontinent, auditory and visual hallucinations - pt eating cigarettes, using shoe for telephone, throwing objects across the room

## 2019-01-24 NOTE — ED Notes (Signed)
Pt ambulatory to the restroom.  

## 2019-01-24 NOTE — ED Notes (Signed)
Report given to Dara Lords, RN on [place]. Still awaiting sitter to arrive.She will call when sitter arrives so we can transport pt to the floor.

## 2019-01-24 NOTE — ED Notes (Signed)
Attempt to call report. No sitter available until 11pm. Will call report at that time.

## 2019-01-24 NOTE — ED Notes (Signed)
Pt dressed out. Pt belonings bag  1 pair grey shoes 1 black belt 1 lighter 1 stripped shirt   pants checked and put back on pt.

## 2019-01-24 NOTE — ED Provider Notes (Signed)
[facility] Emergency Department Provider Note  ____________________________________________   First MD Initiated Contact with Patient 01/24/19 1723     (approximate)  I have reviewed the triage vital signs and the nursing notes.   HISTORY  Chief Complaint Altered Mental Status, Hallucinations, and Suicidal    HPI Michael Schmitt is a 48 y.o. male with psychiatric disorder who presents with altered mental status.  Patient here because he has been having SI, denies a plan, constant, onset the past few days, nothing makes it better, nothing makes it worse.  Patient sister brought him in.  Per triage note patient also having hallucinations and has been eating cigarettes using his shoe for the telephone and acting more confused then normal.   Patient was seen on 10/27 and had a slightly elevated lithium level.  Sodium level at that time was 136.          Past Medical History:  Diagnosis Date  . Anxiety   . Anxiety   . Arthritis    cerv. stenosis, spondylosis, HNP- lower back , has been followed in pain clinic, has  had injection s in cerv. area  . Blood dyscrasia    told that when he was young he was a" free bleeder"  . CAD (coronary artery disease)   . Cervical spondylosis without myelopathy 07/24/2014  . Cervicogenic headache 07/24/2014  . Chronic kidney disease    renal calculi- passed spontaneously  . Depression   . Diabetes mellitus without complication ([place])   . Fatty liver   . GERD (gastroesophageal reflux disease)   . Headache(784.0)   . Hyperlipidemia   . Hypertension   . Mental disorder   . MI, old   . RLS (restless legs syndrome)    detected on sleep study  . Shortness of breath   . Ventricular fibrillation ([place]) 04/05/2016    Patient Active Problem List   Diagnosis Date Noted  . Substance induced mood disorder ([place]) 11/22/2018  . Alcohol abuse with alcohol-induced mood disorder ([place]) 09/21/2018  . Overdose 05/17/2018  .  Depression, major, recurrent, severe with psychosis ([place]) 03/30/2018  . Personality disorder ([place]) 12/31/2017  . MDD (major depressive disorder), recurrent episode, severe ([place]) 11/20/2017  . Dysthymia 10/26/2017  . Generalized anxiety disorder 02/05/2017  . Chronic neck pain (Primary Area of Pain) (Left) 11/22/2016  . Cervical facet syndrome (Left) 11/22/2016  . Cervical radiculitis (Left) 11/22/2016  . History of cervical spinal surgery 11/22/2016  . Cervical spondylosis 11/22/2016  . Chronic shoulder pain (Secondary Area of Pain) (Left) 11/22/2016  . Arthropathy of shoulder (Left) 11/22/2016  . Chronic low back pain [facility] Area of Pain) (Bilateral) (L>R) 11/22/2016  . Lumbar facet syndrome (Bilateral) (L>R) 11/22/2016  . Chronic pain syndrome 11/21/2016  . Long term prescription benzodiazepine use 11/21/2016  . Opiate use 11/21/2016  . Uncontrolled type 2 diabetes mellitus without complication, without long-term current use of insulin 10/19/2016  . Chronic Suicidal ideation 09/15/2016  . Coronary artery disease 09/12/2016  . HTN (hypertension) 06/26/2016  . GERD (gastroesophageal reflux disease) 06/26/2016  . Alcohol use disorder, severe, 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[place]) 06/26/2016  . Benzodiazepine abuse ([place]) 06/11/2016  . Sedative, hypnotic or anxiolytic use disorder, severe, dependence ([place]) 05/12/2016  . Ventricular fibrillation ([place]) 04/05/2016  . Combined hyperlipidemia 01/05/2016  . Cervical spondylosis without myelopathy 07/24/2014  . Severe episode of recurrent major depressive disorder, without psychotic features ([place]) 04/15/2013  . Tobacco use disorder 10/20/2010  . Asthma 10/20/2010  Past Surgical History:  Procedure Laterality Date  . ANTERIOR CERVICAL DECOMP/DISCECTOMY FUSION  11/13/2011   Procedure: ANTERIOR CERVICAL DECOMPRESSION/DISCECTOMY FUSION 1 LEVEL;  Surgeon: [name], MD;  Location: [place] NEURO ORS;  Service: Neurosurgery;  Laterality: N/A;  Anterior  Cervical Decompression/discectomy Fusion. Cervical three-four.  Marland Kitchen CARDIAC CATHETERIZATION N/A 01/01/2015   Procedure: Left Heart Cath and Coronary Angiography;  Surgeon: [name], MD;  Location: [place] CV LAB;  Service: Cardiovascular;  Laterality: N/A;  . CARDIAC CATHETERIZATION N/A 04/05/2016   Procedure: Left Heart Cath and Coronary Angiography;  Surgeon: Belva Crome, MD;  Location: [place] CV LAB;  Service: Cardiovascular;  Laterality: N/A;  . CARDIAC CATHETERIZATION N/A 04/05/2016   Procedure: Coronary Stent Intervention;  Surgeon: Belva Crome, MD;  Location: [place] CV LAB;  Service: Cardiovascular;  Laterality: N/A;  . CARDIAC CATHETERIZATION N/A 04/05/2016   Procedure: Intravascular Ultrasound/IVUS;  Surgeon: Belva Crome, MD;  Location: [place] CV LAB;  Service: Cardiovascular;  Laterality: N/A;  . NASAL SINUS SURGERY     2005    Prior to Admission medications   Medication Sig Start Date End Date Taking? Authorizing Provider  albuterol (PROVENTIL HFA;VENTOLIN HFA) 108 (90 Base) MCG/ACT inhaler Inhale 2 puffs into the lungs every 6 (six) hours as needed for wheezing. 04/11/18 04/11/19  [provider]  amoxicillin-clavulanate (AUGMENTIN) 875-125 MG tablet Take 1 tablet by mouth 2 (two) times daily. Patient not taking: Reported on 01/15/2019 11/03/18   Menshew, Dannielle Karvonen, PA-C  ARIPiprazole (ABILIFY) 15 MG tablet Take 15 mg by mouth daily. 10/14/18   [provider]  aspirin 81 MG EC tablet Take 1 tablet (81 mg total) by mouth daily. For heart health 11/21/17   Lindell Spar I, NP  atorvastatin (LIPITOR) 10 MG tablet Take 10 mg by mouth daily. 10/25/18 10/25/19  [provider]  busPIRone (BUSPAR) 10 MG tablet Take 10 mg by mouth 3 (three) times daily. 09/16/18   [provider]  carbamazepine (TEGRETOL) 200 MG tablet Take 200 mg by mouth 2 (two) times daily. 09/16/18   [provider]  carvedilol (COREG) 12.5 MG tablet Take  12.5 mg by mouth 2 (two) times daily with a meal. 10/25/18 10/25/19  [provider]  clonazePAM (KLONOPIN) 0.5 MG tablet Take 0.5 mg by mouth 2 (two) times daily. 05/23/18   [provider]  cloNIDine (CATAPRES) 0.1 MG tablet Take 0.1 mg by mouth 3 (three) times daily. 05/16/18   [provider]  clopidogrel (PLAVIX) 75 MG tablet Take 75 mg by mouth daily. 03/12/18 03/12/19  [provider]  DULoxetine (CYMBALTA) 20 MG capsule Take 20 mg by mouth daily. 08/05/18   [provider]  gabapentin (NEURONTIN) 300 MG capsule Take 3 capsules (900 mg total) by mouth 3 (three) times daily. 04/01/18   Clapacs, Madie Reno, MD  glipiZIDE (GLUCOTROL XL) 5 MG 24 hr tablet Take 5 mg by mouth daily. 10/15/18 10/15/19  [provider]  ibuprofen (ADVIL,MOTRIN) 800 MG tablet Take 2,400 mg by mouth 3 (three) times daily as needed.    [provider]  ketorolac (TORADOL) 10 MG tablet Take 1 tablet (10 mg total) by mouth every 8 (eight) hours. Patient not taking: Reported on 11/05/2018 11/03/18   Menshew, Dannielle Karvonen, PA-C  lamoTRIgine (LAMICTAL) 150 MG tablet Take 150 mg by mouth daily. 07/17/18   [provider]  lisinopril (ZESTRIL) 10 MG tablet Take 10 mg by mouth daily. 10/15/18 10/15/19  [provider]  LORazepam (ATIVAN) 2 MG tablet Take 2 mg by mouth 3 (three) times daily. 10/02/18   [provider]  metFORMIN (GLUCOPHAGE) 1000 MG tablet Take 1,000 mg by mouth 2 (two) times daily. 10/02/18   [provider]  mirtazapine (REMERON) 30 MG tablet Take 30 mg by mouth Nightly. 10/14/18   [provider]  nitroGLYCERIN (NITROSTAT) 0.4 MG SL tablet Place 0.4 mg under the tongue as needed. 06/03/18   [provider]  ondansetron (ZOFRAN ODT) 4 MG disintegrating tablet Take 1 tablet (4 mg total) by mouth every 8 (eight) hours as needed for nausea or vomiting. 11/20/18   Nena Polio, MD  oxyCODONE-acetaminophen (PERCOCET)  5-325 MG tablet Take 1 tablet by mouth every 4 (four) hours as needed for severe pain. 11/20/18 11/20/19  Nena Polio, MD  QUEtiapine (SEROQUEL) 100 MG tablet Take 1 tablet (100 mg total) by mouth at bedtime. 11/22/18   Merlyn Lot, MD  REXULTI 3 MG TABS Take 3 mg by mouth daily. 09/03/18   [provider]  sildenafil (REVATIO) 20 MG tablet Take 1 tablet (20 mg total) by mouth daily as needed. 10/15/18   Billey Co, MD    Allergies Prednisone, Amoxicillin, Hydrocodone, Varenicline, and Trazodone and nefazodone  Family History  Problem Relation Age of Onset  . Prostate cancer Father   . Hypertension Mother   . Kidney Stones Mother   . Anxiety disorder Mother   . Depression Mother   . COPD Sister   . Hypertension Sister   . Diabetes Sister   . Depression Sister   . Anxiety disorder Sister   . Seizures Sister   . ADD / ADHD Son   . ADD / ADHD Daughter     Social History Social History   Tobacco Use  . Smoking status: Current Every Day Smoker    Packs/day: 2.00    Years: 17.00    Pack years: 34.00    Types: Cigarettes, Cigars  . Smokeless tobacco: Former Systems developer  . Tobacco comment: occassional snuff   Substance Use Topics  . Alcohol use: No    Alcohol/week: 0.0 standard drinks    Comment: last drinked in 3 months.   . Drug use: No      Review of Systems Constitutional: No fever/chills Eyes: No visual changes. ENT: No sore throat. Cardiovascular: Denies chest pain. Respiratory: Denies shortness of breath. Gastrointestinal: No abdominal pain.  No nausea, no vomiting.  No diarrhea.  No constipation. Genitourinary: Negative for dysuria. Musculoskeletal: Negative for back pain. Skin: Negative for rash. Neurological: Negative for headaches, focal weakness or numbness. + confusion Psych: SI  All other ROS negative ____________________________________________   PHYSICAL EXAM:  VITAL SIGNS: ED Triage Vitals  Enc Vitals Group     BP 01/24/19 1705  136/72     Pulse Rate 01/24/19 1705 82     Resp 01/24/19 1705 17     Temp 01/24/19 1705 98 F (36.7 C)     Temp Source 01/24/19 1705 Oral     SpO2 01/24/19 1705 98 %     Weight 01/24/19 1644 255 lb (115.7 kg)     Height 01/24/19 1644 6\' 2"  (1.88 m)     Head Circumference --      Peak Flow --      Pain Score 01/24/19 1644 0     Pain Loc --      Pain Edu? --      Excl. in [place]? --  Constitutional: Alert and oriented. Well appearing and in no acute distress. Eyes: Conjunctivae are normal. EOMI. Head: Atraumatic. Nose: No congestion/rhinnorhea. Mouth/Throat: Mucous membranes are moist.   Neck: No stridor. Trachea Midline. FROM Cardiovascular: Normal rate, regular rhythm. Grossly normal heart sounds.  Good peripheral circulation. Respiratory: Normal respiratory effort.  No retractions. Lungs CTAB. Gastrointestinal: Soft and nontender. No distention. No abdominal bruits.  Musculoskeletal: No lower extremity tenderness nor edema.  No joint effusions. Neurologic:  Normal speech and language. No gross focal neurologic deficits are appreciated.  Skin:  Skin is warm, dry and intact. No rash noted. Psychiatric: flat affect + si no hi ah vh  GU: Deferred   ____________________________________________   LABS (all labs ordered are listed, but only abnormal results are displayed)  Labs Reviewed  COMPREHENSIVE METABOLIC PANEL - Abnormal; Notable for the following components:      Result Value   Sodium 126 (*)    Potassium 3.2 (*)    Chloride 91 (*)    Glucose, Bld 160 (*)    All other components within normal limits  ACETAMINOPHEN LEVEL - Abnormal; Notable for the following components:   Acetaminophen (Tylenol), Serum <10 (*)    All other components within normal limits  CBC - Abnormal; Notable for the following components:   WBC 13.3 (*)    RBC 4.11 (*)    Hemoglobin 12.9 (*)    HCT 37.0 (*)    All other components within normal limits  URINE DRUG SCREEN, QUALITATIVE ([facility]  ONLY) - Abnormal; Notable for the following components:   Amphetamines, Ur Screen POSITIVE (*)    All other components within normal limits  LITHIUM LEVEL - Abnormal; Notable for the following components:   Lithium Lvl 1.28 (*)    All other components within normal limits  SARS CORONAVIRUS 2 BY RT PCR (HOSPITAL ORDER, [place] [facility])  ETHANOL  SALICYLATE LEVEL  SODIUM, URINE, RANDOM  OSMOLALITY, URINE  OSMOLALITY  UREA NITROGEN, URINE  CREATININE, URINE, RANDOM   ____________________________________________  INITIAL IMPRESSION / ASSESSMENT AND PLAN / ED COURSE  Michael Schmitt was evaluated in Emergency Department on 01/24/2019 for the symptoms described in the history of present illness. He was evaluated in the context of the global COVID-19 pandemic, which necessitated consideration that the patient might be at risk for infection with the SARS-CoV-2 virus that causes COVID-19. Institutional protocols and algorithms that pertain to the evaluation of patients at risk for COVID-19 are in a state of rapid change based on information released by regulatory bodies including the CDC and federal and state organizations. These policies and algorithms were followed during the patient's care in the ED.    Patient presents with SI.  Will IVC patient for psychiatric evaluation given he is eating cigarettes and having worsening hallucinations.  Will get labs to evaluate for electrolyte abnormalities, AKI, lithium level given recent elevation.  No recent trauma to suggest intracranial injury.   Patient with new hyponatremia over the past 3 days in the setting of recently elevated lithium level.  Na 126.   Lithium level remains slightly elevated 1.28.  Given patient's new hyponatremia we will give 500 cc of normal saline.  Will send urine studies and discussed with the hospital team for admission.  Psychiatric consults were placed for inpatient team.    ________________________________________   FINAL CLINICAL IMPRESSION(S) / ED DIAGNOSES   Final diagnoses:  Suicidal ideation  Hyponatremia  Lithium toxicity, accidental or unintentional, initial encounter  MEDICATIONS GIVEN DURING THIS VISIT:  Medications  sodium chloride 0.9 % bolus 500 mL (500 mLs Intravenous New Bag/Given 01/24/19 1830)     ED Discharge Orders    None       Note:  This document was prepared using Dragon voice recognition software and may include unintentional dictation errors.   Vanessa Logan [place], MD 01/24/19 [phone]

## 2019-01-24 NOTE — ED Notes (Signed)
Pt states that he has been feeling bad x 2 days, pt reports having SI. Pt states that he has not been taking his medication and has not taken them "a while". Pt has small abrasion on his right posterior hand, pt reports that he fell while on the way here. Pt is unsure if he hit his head or not. No visible wounds noted. Jerking and twitching observed by this RN. Pt cooperative at this time.

## 2019-01-25 DIAGNOSIS — R410 Disorientation, unspecified: Secondary | ICD-10-CM | POA: Diagnosis not present

## 2019-01-25 DIAGNOSIS — R45851 Suicidal ideations: Secondary | ICD-10-CM

## 2019-01-25 DIAGNOSIS — G4700 Insomnia, unspecified: Secondary | ICD-10-CM | POA: Diagnosis not present

## 2019-01-25 DIAGNOSIS — I252 Old myocardial infarction: Secondary | ICD-10-CM | POA: Diagnosis not present

## 2019-01-25 DIAGNOSIS — E871 Hypo-osmolality and hyponatremia: Principal | ICD-10-CM

## 2019-01-25 DIAGNOSIS — F419 Anxiety disorder, unspecified: Secondary | ICD-10-CM | POA: Diagnosis present

## 2019-01-25 DIAGNOSIS — Z888 Allergy status to other drugs, medicaments and biological substances status: Secondary | ICD-10-CM | POA: Diagnosis not present

## 2019-01-25 DIAGNOSIS — E785 Hyperlipidemia, unspecified: Secondary | ICD-10-CM | POA: Diagnosis present

## 2019-01-25 DIAGNOSIS — R7989 Other specified abnormal findings of blood chemistry: Secondary | ICD-10-CM | POA: Diagnosis not present

## 2019-01-25 DIAGNOSIS — T43591A Poisoning by other antipsychotics and neuroleptics, accidental (unintentional), initial encounter: Secondary | ICD-10-CM | POA: Diagnosis present

## 2019-01-25 DIAGNOSIS — G2581 Restless legs syndrome: Secondary | ICD-10-CM | POA: Diagnosis present

## 2019-01-25 DIAGNOSIS — Z20828 Contact with and (suspected) exposure to other viral communicable diseases: Secondary | ICD-10-CM | POA: Diagnosis present

## 2019-01-25 DIAGNOSIS — E860 Dehydration: Secondary | ICD-10-CM | POA: Diagnosis present

## 2019-01-25 DIAGNOSIS — E119 Type 2 diabetes mellitus without complications: Secondary | ICD-10-CM | POA: Diagnosis not present

## 2019-01-25 DIAGNOSIS — F19188 Other psychoactive substance abuse with other psychoactive substance-induced disorder: Secondary | ICD-10-CM

## 2019-01-25 DIAGNOSIS — Z833 Family history of diabetes mellitus: Secondary | ICD-10-CM | POA: Diagnosis not present

## 2019-01-25 DIAGNOSIS — Z79899 Other long term (current) drug therapy: Secondary | ICD-10-CM | POA: Diagnosis not present

## 2019-01-25 DIAGNOSIS — F17210 Nicotine dependence, cigarettes, uncomplicated: Secondary | ICD-10-CM | POA: Diagnosis present

## 2019-01-25 DIAGNOSIS — F331 Major depressive disorder, recurrent, moderate: Secondary | ICD-10-CM | POA: Diagnosis present

## 2019-01-25 DIAGNOSIS — Z885 Allergy status to narcotic agent status: Secondary | ICD-10-CM | POA: Diagnosis not present

## 2019-01-25 DIAGNOSIS — E118 Type 2 diabetes mellitus with unspecified complications: Secondary | ICD-10-CM | POA: Diagnosis present

## 2019-01-25 DIAGNOSIS — I2510 Atherosclerotic heart disease of native coronary artery without angina pectoris: Secondary | ICD-10-CM | POA: Diagnosis present

## 2019-01-25 DIAGNOSIS — I10 Essential (primary) hypertension: Secondary | ICD-10-CM | POA: Diagnosis not present

## 2019-01-25 DIAGNOSIS — Z818 Family history of other mental and behavioral disorders: Secondary | ICD-10-CM | POA: Diagnosis not present

## 2019-01-25 DIAGNOSIS — I129 Hypertensive chronic kidney disease with stage 1 through stage 4 chronic kidney disease, or unspecified chronic kidney disease: Secondary | ICD-10-CM | POA: Diagnosis present

## 2019-01-25 DIAGNOSIS — T56891A Toxic effect of other metals, accidental (unintentional), initial encounter: Secondary | ICD-10-CM | POA: Diagnosis not present

## 2019-01-25 DIAGNOSIS — Z8249 Family history of ischemic heart disease and other diseases of the circulatory system: Secondary | ICD-10-CM | POA: Diagnosis not present

## 2019-01-25 DIAGNOSIS — Z825 Family history of asthma and other chronic lower respiratory diseases: Secondary | ICD-10-CM | POA: Diagnosis not present

## 2019-01-25 DIAGNOSIS — Z880 Allergy status to penicillin: Secondary | ICD-10-CM | POA: Diagnosis not present

## 2019-01-25 DIAGNOSIS — K219 Gastro-esophageal reflux disease without esophagitis: Secondary | ICD-10-CM | POA: Diagnosis present

## 2019-01-25 DIAGNOSIS — Z981 Arthrodesis status: Secondary | ICD-10-CM | POA: Diagnosis not present

## 2019-01-25 LAB — CBC
HCT: 38.3 % — ABNORMAL LOW (ref 39.0–52.0)
Hemoglobin: 13.2 g/dL (ref 13.0–17.0)
MCH: 31.2 pg (ref 26.0–34.0)
MCHC: 34.5 g/dL (ref 30.0–36.0)
MCV: 90.5 fL (ref 80.0–100.0)
Platelets: 258 K/uL (ref 150–400)
RBC: 4.23 MIL/uL (ref 4.22–5.81)
RDW: 13.5 % (ref 11.5–15.5)
WBC: 8.2 K/uL (ref 4.0–10.5)
nRBC: 0 % (ref 0.0–0.2)

## 2019-01-25 LAB — BASIC METABOLIC PANEL WITH GFR
Anion gap: 8 (ref 5–15)
BUN: 7 mg/dL (ref 6–20)
CO2: 26 mmol/L (ref 22–32)
Calcium: 9.6 mg/dL (ref 8.9–10.3)
Chloride: 106 mmol/L (ref 98–111)
Creatinine, Ser: 0.89 mg/dL (ref 0.61–1.24)
GFR calc Af Amer: >60 mL/min
GFR calc non Af Amer: >60 mL/min
Glucose, Bld: 155 mg/dL — ABNORMAL HIGH (ref 70–99)
Potassium: 3.7 mmol/L (ref 3.5–5.1)
Sodium: 140 mmol/L (ref 135–145)

## 2019-01-25 LAB — HIV ANTIBODY (ROUTINE TESTING W REFLEX): HIV Screen 4th Generation wRfx: NONREACTIVE

## 2019-01-25 LAB — OSMOLALITY: Osmolality: 290 mOsm/kg (ref 275–295)

## 2019-01-25 NOTE — Progress Notes (Signed)
Confirmed with Waylan Boga, NP, that suicide precautions are to be d/c'd in addition to IVC.

## 2019-01-25 NOTE — Progress Notes (Signed)
[place] [place] NAME: Michael Schmitt    MR#:  [id number]  DATE OF BIRTH:  1970-10-12  SUBJECTIVE:  CHIEF COMPLAINT: Patient is feeling ok .  Reports he has been having these suicidal ideations for several days.  Denies any homicidal ideations.  Cooperative sitter at bedside  REVIEW OF SYSTEMS:  CONSTITUTIONAL: No fever, fatigue or weakness.  EYES: No blurred or double vision.  EARS, NOSE, AND THROAT: No tinnitus or ear pain.  RESPIRATORY: No cough, shortness of breath, wheezing or hemoptysis.  CARDIOVASCULAR: No chest pain, orthopnea, edema.  GASTROINTESTINAL: No nausea, vomiting, diarrhea or abdominal pain.  GENITOURINARY: No dysuria, hematuria.  ENDOCRINE: No polyuria, nocturia,  HEMATOLOGY: No anemia, easy bruising or bleeding SKIN: No rash or lesion. MUSCULOSKELETAL: No joint pain or arthritis.   NEUROLOGIC: No tingling, numbness, weakness.  PSYCHIATRY: Actively depressed with suicidal ideations  DRUG ALLERGIES:   Allergies  Allergen Reactions  . Prednisone Other (See Comments)    Hypertension and makes him feel "spacey"  . Amoxicillin Other (See Comments)    Other Reaction: ELEVATED BP  . Hydrocodone Other (See Comments)    Causes headaches and makes the patient irritable  . Varenicline Other (See Comments)    Suicidal thoughts  . Trazodone And Nefazodone Anxiety and Other (See Comments)    Patient stated that this keeps him awake; "has an opposite effect on me"    VITALS:  Blood pressure 116/77, pulse 81, temperature 98.5 F (36.9 C), temperature source Oral, resp. rate 20, height 6\' 2"  (1.88 m), weight 83.3 kg, SpO2 100 %.  PHYSICAL EXAMINATION:  GENERAL:  48 y.o.-year-old patient lying in the bed with no acute distress.  EYES: Pupils equal, round, reactive to light and accommodation. No scleral icterus. Extraocular muscles intact.  HEENT: Head atraumatic, normocephalic. Oropharynx and nasopharynx clear.   NECK:  Supple, no jugular venous distention. No thyroid enlargement, no tenderness.  LUNGS: Normal breath sounds bilaterally, no wheezing, rales,rhonchi or crepitation. No use of accessory muscles of respiration.  CARDIOVASCULAR: S1, S2 normal. No murmurs, rubs, or gallops.  ABDOMEN: Soft, nontender, nondistended. Bowel sounds present.   EXTREMITIES: No pedal edema, cyanosis, or clubbing.  NEUROLOGIC: Cranial nerves II through XII are intact. Muscle strength 5/5 in all extremities. Sensation intact. Gait not checked.  PSYCHIATRIC: The patient is alert and oriented x 3.  SKIN: No obvious rash, lesion, or ulcer.    LABORATORY PANEL:   CBC Recent Labs  Lab 01/25/19 0710  WBC 8.2  HGB 13.2  HCT 38.3*  PLT 258   ------------------------------------------------------------------------------------------------------------------  Chemistries  Recent Labs  Lab 01/24/19 1656 01/25/19 0710  NA 126* 140  K 3.2* 3.7  CL 91* 106  CO2 24 26  GLUCOSE 160* 155*  BUN 6 7  CREATININE 0.94 0.89  CALCIUM 10.2 9.6  AST 20  --   ALT 21  --   ALKPHOS 117  --   BILITOT 1.1  --    ------------------------------------------------------------------------------------------------------------------  Cardiac Enzymes No results for input(s): TROPONINI in the last 168 hours. ------------------------------------------------------------------------------------------------------------------  RADIOLOGY:  No results found.  EKG:   Orders placed or performed during the hospital encounter of 01/21/19  . ED EKG  . ED EKG  . EKG 12-Lead  . EKG 12-Lead    ASSESSMENT AND PLAN:   1.  Hyponatremia.    From dehydration  Improved with gentle hydration with IV fluids.  Sodium is at 140 today  hold carbamazepine for  right now.   Covid test is negative 2.  Slightly elevated lithium level.  Hold lithium.  IV fluid hydration.  Repeat lithium levels 3.  Suicidal ideation.  One-to-one watch.  ER physician  did IVC paperwork.  Psychiatric consultation is pending. Hold the high-dose Ativan.  Anticipate psychiatric admission after their assessment today 4.  Hypertension.  Hold clonidine and continue other medications. 5.  Type 2 diabetes mellitus.  Put on sliding scale and hold oral medications for right now 6.  Hyperlipidemia continue statin. 7.  Tobacco abuse.  Smoking cessation counseling done by the admitting physician    All the records are reviewed and case discussed with Care Management/Social Workerr. Management plans discussed with the patient, he is in agreement.  CODE STATUS: fc   TOTAL TIME TAKING CARE OF THIS PATIENT:  35 minutes.   POSSIBLE D/C IN 1-2  DAYS, DEPENDING ON CLINICAL CONDITION.  Note: This dictation was prepared with Dragon dictation along with smaller phrase technology. Any transcriptional errors that result from this process are unintentional.   Nicholes Mango M.D on 01/25/2019 at 1:07 PM  Between 7am to 6pm - Pager - [phone] After 6pm go to [email or web address] - password [email or web address] [facility]  [place] [facility]  Office  [phone]  CC: Primary care physician; [facility]

## 2019-01-25 NOTE — Progress Notes (Signed)
Patient is not oriented well enough to answer admission questions. Safety sitter at bedside. Will continue to monitor patient.

## 2019-01-25 NOTE — Consult Note (Signed)
[place] Psychiatry Consult   Reason for Consult:  Confusion, suicidal ideations Referring Physician:  Dr Leslye Peer Patient Identification: Michael Schmitt MRN:  [id number] Principal Diagnosis: hyponatremia  Diagnosis:  Active Problems:   Chronic Suicidal ideation   Major depressive disorder, recurrent episode, moderate ([place])   Hyponatremia  Total Time spent with patient: 1 hour  Subjective:   Michael Schmitt is a 48 y.o. male patient admitted with hyponatremia, consult for suicidal ideations.  "Yea, I have suicidal ideations."  Denies intent or plan.  Patient seen and evaluated by this provider in person.  Long history of depression and substance abuse with chronic suicidal ideations.  He is well-known to the ED for multiple presentations and typically discharged.  He does receive his care at [facility] behavioral and continue there.  No suicide intent or plan, appears to be at his baseline.  HPI per MD:  Michael Schmitt  is a 48 y.o. male coming into the hospital with suicidal ideation.  The patient is not the best historian.  Did not state that he has a plan.  He states that he has had some falls and cut his hand.  He lives with his sister mother and his 2 kids.  I did get permission to speak with his sister on the phone.  She states that he has been walking around naked in the house and she told him he could not do that because he has 2 kids.  She states that he has not been doing well and has been very confused.  Sister states that the patient has been hallucinating and is not himself and he needs help.  In the ER his sodium level was a little low and his lithium level was still little high.  ER physician did put an IVC paperwork in for psychiatric consultation.  Past Psychiatric History: depression, substance abuse  Risk to Self:  none Risk to Others:  none Prior Inpatient Therapy:  multiple Prior Outpatient Therapy:  [facility]  Past Medical History:  Past Medical  History:  Diagnosis Date  . Anxiety   . Anxiety   . Arthritis    cerv. stenosis, spondylosis, HNP- lower back , has been followed in pain clinic, has  had injection s in cerv. area  . Blood dyscrasia    told that when he was young he was a" free bleeder"  . CAD (coronary artery disease)   . Cervical spondylosis without myelopathy 07/24/2014  . Cervicogenic headache 07/24/2014  . Chronic kidney disease    renal calculi- passed spontaneously  . Depression   . Diabetes mellitus without complication ([place])   . Fatty liver   . GERD (gastroesophageal reflux disease)   . Headache(784.0)   . Hyperlipidemia   . Hypertension   . Mental disorder   . MI, old   . RLS (restless legs syndrome)    detected on sleep study  . Shortness of breath   . Ventricular fibrillation ([place]) 04/05/2016    Past Surgical History:  Procedure Laterality Date  . ANTERIOR CERVICAL DECOMP/DISCECTOMY FUSION  11/13/2011   Procedure: ANTERIOR CERVICAL DECOMPRESSION/DISCECTOMY FUSION 1 LEVEL;  Surgeon: [name], MD;  Location: [place] [facility]URO ORS;  Service: Neurosurgery;  Laterality: N/A;  Anterior Cervical Decompression/discectomy Fusion. Cervical three-four.  Marland Kitchen CARDIAC CATHETERIZATION N/A 01/01/2015   Procedure: Left Heart Cath and Coronary Angiography;  Surgeon: [name], MD;  Location: [place] CV LAB;  Service: Cardiovascular;  Laterality: N/A;  . CARDIAC CATHETERIZATION N/A 04/05/2016   Procedure:  Left Heart Cath and Coronary Angiography;  Surgeon: Belva Crome, MD;  Location: [place] CV LAB;  Service: Cardiovascular;  Laterality: N/A;  . CARDIAC CATHETERIZATION N/A 04/05/2016   Procedure: Coronary Stent Intervention;  Surgeon: Belva Crome, MD;  Location: [place] CV LAB;  Service: Cardiovascular;  Laterality: N/A;  . CARDIAC CATHETERIZATION N/A 04/05/2016   Procedure: Intravascular Ultrasound/IVUS;  Surgeon: Belva Crome, MD;  Location: [place] CV LAB;  Service: Cardiovascular;  Laterality: N/A;  .  NASAL SINUS SURGERY     2005   Family History:  Family History  Problem Relation Age of Onset  . Prostate cancer Father   . Hypertension Mother   . Kidney Stones Mother   . Anxiety disorder Mother   . Depression Mother   . COPD Sister   . Hypertension Sister   . Diabetes Sister   . Depression Sister   . Anxiety disorder Sister   . Seizures Sister   . ADD / ADHD Son   . ADD / ADHD Daughter    Family Psychiatric  History: see above Social History:  Social History   Substance and Sexual Activity  Alcohol Use No  . Alcohol/week: 0.0 standard drinks   Comment: last drinked in 3 months.      Social History   Substance and Sexual Activity  Drug Use No    Social History   Socioeconomic History  . Marital status: Single    Spouse name: Not on file  . Number of children: 3  . Years of education: 91  . Highest education level: Not on file  Occupational History    Comment: unemployed  Social Needs  . Financial resource strain: Not on file  . Food insecurity    Worry: Not on file    Inability: Not on file  . Transportation needs    Medical: Not on file    Non-medical: Not on file  Tobacco Use  . Smoking status: Current Every Day Smoker    Packs/day: 2.00    Years: 17.00    Pack years: 34.00    Types: Cigarettes, Cigars  . Smokeless tobacco: Former Systems developer  . Tobacco comment: occassional snuff   Substance and Sexual Activity  . Alcohol use: No    Alcohol/week: 0.0 standard drinks    Comment: last drinked in 3 months.   . Drug use: No  . Sexual activity: Not Currently  Lifestyle  . Physical activity    Days per week: Not on file    Minutes per session: Not on file  . Stress: Not on file  Relationships  . Social Herbalist on phone: Not on file    Gets together: Not on file    Attends religious service: Not on file    Active member of club or organization: Not on file    Attends meetings of clubs or organizations: Not on file    Relationship  status: Not on file  Other Topics Concern  . Not on file  Social History Narrative   Patient is right handed.   Patient drinks 2 sodas daily.   Additional Social History:    Allergies:   Allergies  Allergen Reactions  . Prednisone Other (See Comments)    Hypertension and makes him feel "spacey"  . Amoxicillin Other (See Comments)    Other Reaction: ELEVATED BP  . Hydrocodone Other (See Comments)    Causes headaches and makes the patient irritable  . Varenicline Other (See  Comments)    Suicidal thoughts  . Trazodone And Nefazodone Anxiety and Other (See Comments)    Patient stated that this keeps him awake; "has an opposite effect on me"    Labs:  Results for orders placed or performed during the hospital encounter of 01/24/19 (from the past 48 hour(s))  Comprehensive metabolic panel     Status: Abnormal   Collection Time: 01/24/19  4:56 PM  Result Value Ref Range   Sodium 126 (L) 135 - 145 mmol/L   Potassium 3.2 (L) 3.5 - 5.1 mmol/L   Chloride 91 (L) 98 - 111 mmol/L   CO2 24 22 - 32 mmol/L   Glucose, Bld 160 (H) 70 - 99 mg/dL   BUN 6 6 - 20 mg/dL   Creatinine, Ser 0.94 0.61 - 1.24 mg/dL   Calcium 10.2 8.9 - 10.3 mg/dL   Total Protein 7.1 6.5 - 8.1 g/dL   Albumin 4.0 3.5 - 5.0 g/dL   AST 20 15 - 41 U/L   ALT 21 0 - 44 U/L   Alkaline Phosphatase 117 38 - 126 U/L   Total Bilirubin 1.1 0.3 - 1.2 mg/dL   GFR calc non Af Amer >60 >60 mL/min   GFR calc Af Amer >60 >60 mL/min   Anion gap 11 5 - 15    Comment: Performed at [facility], [address] [place], [place] [address]  Ethanol     Status: None   Collection Time: 01/24/19  4:56 PM  Result Value Ref Range   Alcohol, Ethyl (B) <10 <10 mg/dL    Comment: (NOTE) Lowest detectable limit for serum alcohol is 10 mg/dL. For medical purposes only. Performed at [facility], [address] [place], [place] [id number]   Salicylate level     Status: None   Collection Time: 01/24/19  4:56 PM  Result  Value Ref Range   Salicylate Lvl [id number] 2.8 - 30.0 mg/dL    Comment: Performed at [facility], [address] [place], [place] [address]  Acetaminophen level     Status: Abnormal   Collection Time: 01/24/19  4:56 PM  Result Value Ref Range   Acetaminophen (Tylenol), Serum <10 (L) 10 - 30 ug/mL    Comment: (NOTE) Therapeutic concentrations vary significantly. A range of 10-30 ug/mL  may be an effective concentration for many patients. However, some  are best treated at concentrations outside of this range. Acetaminophen concentrations >150 ug/mL at 4 hours after ingestion  and >50 ug/mL at 12 hours after ingestion are often associated with  toxic reactions. Performed at [facility], [address] [place], [place] [address]   cbc     Status: Abnormal   Collection Time: 01/24/19  4:56 PM  Result Value Ref Range   WBC 13.3 (H) 4.0 - 10.5 K/uL   RBC 4.11 (L) 4.22 - 5.81 MIL/uL   Hemoglobin 12.9 (L) 13.0 - 17.0 g/dL   HCT 37.0 (L) 39.0 - 52.0 %   MCV 90.0 80.0 - 100.0 fL   MCH 31.4 26.0 - 34.0 pg   MCHC 34.9 30.0 - 36.0 g/dL   RDW 13.3 11.5 - 15.5 %   Platelets 299 150 - 400 K/uL   nRBC 0.0 0.0 - 0.2 %    Comment: Performed at [facility], [address], [place], [place] [address]  Urine Drug Screen, Qualitative     Status: Abnormal   Collection Time: 01/24/19  4:56 PM  Result Value Ref Range  Tricyclic, Ur Screen NONE DETECTED NONE DETECTED   Amphetamines, Ur Screen POSITIVE (A) NONE DETECTED   MDMA (Ecstasy)Ur Screen NONE DETECTED NONE DETECTED   Cocaine Metabolite,Ur [place] NONE DETECTED NONE DETECTED   Opiate, Ur Screen NONE DETECTED NONE DETECTED   Phencyclidine (PCP) Ur S NONE DETECTED NONE DETECTED   Cannabinoid 50 Ng, Ur [place] NONE DETECTED NONE DETECTED   Barbiturates, Ur Screen NONE DETECTED NONE DETECTED   Benzodiazepine, Ur Scrn NONE DETECTED NONE DETECTED   Methadone Scn, Ur NONE DETECTED NONE DETECTED    Comment: (NOTE) Tricyclics +  metabolites, urine    Cutoff 1000 ng/mL Amphetamines + metabolites, urine  Cutoff 1000 ng/mL MDMA (Ecstasy), urine              Cutoff 500 ng/mL Cocaine Metabolite, urine          Cutoff 300 ng/mL Opiate + metabolites, urine        Cutoff 300 ng/mL Phencyclidine (PCP), urine         Cutoff 25 ng/mL Cannabinoid, urine                 Cutoff 50 ng/mL Barbiturates + metabolites, urine  Cutoff 200 ng/mL Benzodiazepine, urine              Cutoff 200 ng/mL Methadone, urine                   Cutoff 300 ng/mL The urine drug screen provides only a preliminary, unconfirmed analytical test result and should not be used for non-medical purposes. Clinical consideration and professional judgment should be applied to any positive drug screen result due to possible interfering substances. A more specific alternate chemical method must be used in order to obtain a confirmed analytical result. Gas chromatography / mass spectrometry (GC/MS) is the preferred confirmat ory method. Performed at [facility], [address] [place], [place] [address]   Sodium, urine, random     Status: None   Collection Time: 01/24/19  4:56 PM  Result Value Ref Range   Sodium, Ur <10 mmol/L    Comment: Performed at [facility], [address] [place], [place] [address]  Osmolality, urine     Status: Abnormal   Collection Time: 01/24/19  4:56 PM  Result Value Ref Range   Osmolality, Ur 94 (L) 300 - 900 mOsm/kg    Comment: Performed at [facility], [address] [place], [place] [address]  Creatinine, urine, random     Status: None   Collection Time: 01/24/19  4:56 PM  Result Value Ref Range   Creatinine, Urine 53 mg/dL    Comment: Performed at [facility], [address] [place], [place] [address]  Lithium level     Status: Abnormal   Collection Time: 01/24/19  5:03 PM  Result Value Ref Range   Lithium Lvl 1.28 (H) 0.60 - 1.20 mmol/L    Comment: Performed at [facility], [address], [place], [place] [address]  SARS Coronavirus 2 by RT PCR (hospital order, performed in [place] hospital lab) Nasopharyngeal Nasopharyngeal Swab     Status: None   Collection Time: 01/24/19  6:46 PM   Specimen: Nasopharyngeal Swab  Result Value Ref Range   SARS Coronavirus 2 NEGATIVE NEGATIVE    Comment: (NOTE) If result is NEGATIVE SARS-CoV-2 target nucleic acids are NOT DETECTED. The SARS-CoV-2 RNA is generally detectable in upper and lower  respiratory specimens during the acute phase of infection. The  lowest  concentration of SARS-CoV-2 viral copies this assay can detect is 250  copies / mL. A negative result does not preclude SARS-CoV-2 infection  and should not be used as the sole basis for treatment or other  patient management decisions.  A negative result may occur with  improper specimen collection / handling, submission of specimen other  than nasopharyngeal swab, presence of viral mutation(s) within the  areas targeted by this assay, and inadequate number of viral copies  (<250 copies / mL). A negative result must be combined with clinical  observations, patient history, and epidemiological information. If result is POSITIVE SARS-CoV-2 target nucleic acids are DETECTED. The SARS-CoV-2 RNA is generally detectable in upper and lower  respiratory specimens dur ing the acute phase of infection.  Positive  results are indicative of active infection with SARS-CoV-2.  Clinical  correlation with patient history and other diagnostic information is  necessary to determine patient infection status.  Positive results do  not rule out bacterial infection or co-infection with other viruses. If result is PRESUMPTIVE POSTIVE SARS-CoV-2 nucleic acids MAY BE PRESENT.   A presumptive positive result was obtained on the submitted specimen  and confirmed on repeat testing.  While 2019 novel coronavirus  (SARS-CoV-2) nucleic acids may be present in the  submitted sample  additional confirmatory testing may be necessary for epidemiological  and / or clinical management purposes  to differentiate between  SARS-CoV-2 and other Sarbecovirus currently known to infect humans.  If clinically indicated additional testing with an alternate test  methodology [phone]) is advised. The SARS-CoV-2 RNA is generally  detectable in upper and lower respiratory sp ecimens during the acute  phase of infection. The expected result is Negative. Fact Sheet for Patients:  StrictlyIdeas.no Fact Sheet for Healthcare Providers: BankingDealers.co.za This test is not yet approved or cleared by the [place] FDA and has been authorized for detection and/or diagnosis of SARS-CoV-2 by FDA under an Emergency Use Authorization (EUA).  This EUA will remain in effect (meaning this test can be used) for the duration of the COVID-19 declaration under Section 564(b)(1) of the Act, 21 U.S.C. section 360bbb-3(b)(1), unless the authorization is terminated or revoked sooner. Performed at [facility], [address] [place], [place] [address]   Osmolality     Status: None   Collection Time: 01/25/19  7:10 AM  Result Value Ref Range   Osmolality 290 275 - 295 mOsm/kg    Comment: Performed at [facility], [address] [place], [place] [id number]  Basic metabolic panel     Status: Abnormal   Collection Time: 01/25/19  7:10 AM  Result Value Ref Range   Sodium 140 135 - 145 mmol/L    Comment: RESULTS VERIFIED BY REPEAT TESTING/HKP   Potassium 3.7 3.5 - 5.1 mmol/L   Chloride 106 98 - 111 mmol/L   CO2 26 22 - 32 mmol/L   Glucose, Bld 155 (H) 70 - 99 mg/dL   BUN 7 6 - 20 mg/dL   Creatinine, Ser 0.89 0.61 - 1.24 mg/dL   Calcium 9.6 8.9 - 10.3 mg/dL   GFR calc non Af Amer >60 >60 mL/min   GFR calc Af Amer >60 >60 mL/min   Anion gap 8 5 - 15    Comment: Performed at [facility], [address], [place], [place] [address]  CBC     Status: Abnormal   Collection Time: 01/25/19  7:10 AM  Result Value Ref Range   WBC  8.2 4.0 - 10.5 K/uL   RBC 4.23 4.22 - 5.81 MIL/uL   Hemoglobin 13.2 13.0 - 17.0 g/dL   HCT 38.3 (L) 39.0 - 52.0 %   MCV 90.5 80.0 - 100.0 fL   MCH 31.2 26.0 - 34.0 pg   MCHC 34.5 30.0 - 36.0 g/dL   RDW 13.5 11.5 - 15.5 %   Platelets 258 150 - 400 K/uL   nRBC 0.0 0.0 - 0.2 %    Comment: Performed at [facility], [address] [place], [place] [address]    Current Facility-Administered Medications  Medication Dose Route Frequency Provider Last Rate Last Dose  . acetaminophen (TYLENOL) tablet 650 mg  650 mg Oral Q6H PRN Loletha Grayer, MD       Or  . acetaminophen (TYLENOL) suppository 650 mg  650 mg Rectal Q6H PRN Wieting, Richard, MD      . albuterol (PROVENTIL) (2.5 MG/3ML) 0.083% nebulizer solution 2.5 mg  2.5 mg Inhalation Q6H PRN Wieting, Richard, MD      . ARIPiprazole (ABILIFY) tablet 15 mg  15 mg Oral Daily Loletha Grayer, MD   15 mg at 01/25/19 1109  . aspirin EC tablet 81 mg  81 mg Oral Daily Loletha Grayer, MD   81 mg at 01/25/19 1109  . atorvastatin (LIPITOR) tablet 10 mg  10 mg Oral q1800 Wieting, Richard, MD      . brexpiprazole (REXULTI) tablet 3 mg  3 mg Oral Daily Loletha Grayer, MD   3 mg at 01/25/19 1106  . carvedilol (COREG) tablet 12.5 mg  12.5 mg Oral BID WC Wieting, Richard, MD      . clopidogrel (PLAVIX) tablet 75 mg  75 mg Oral Daily Loletha Grayer, MD   75 mg at 01/25/19 1110  . DULoxetine (CYMBALTA) DR capsule 20 mg  20 mg Oral Daily Loletha Grayer, MD   20 mg at 01/25/19 1108  . enoxaparin (LOVENOX) injection 40 mg  40 mg Subcutaneous QHS Loletha Grayer, MD   40 mg at 01/25/19 0126  . lamoTRIgine (LAMICTAL) tablet 150 mg  150 mg Oral Daily Loletha Grayer, MD   150 mg at 01/25/19 1105  . lisinopril (ZESTRIL) tablet 10 mg  10 mg Oral Daily Loletha Grayer, MD   10 mg at 01/25/19 1110  . mirtazapine (REMERON)  tablet 30 mg  30 mg Oral QHS Wieting, Richard, MD      . nicotine (NICODERM CQ - dosed in mg/24 hours) patch 21 mg  21 mg Transdermal Daily Loletha Grayer, MD   21 mg at 01/25/19 1111  . nitroGLYCERIN (NITROSTAT) SL tablet 0.4 mg  0.4 mg Sublingual PRN Wieting, Richard, MD      . ondansetron [facility]) tablet 4 mg  4 mg Oral Q6H PRN Wieting, Richard, MD       Or  . ondansetron [facility]) injection 4 mg  4 mg Intravenous Q6H PRN Loletha Grayer, MD   4 mg at 01/25/19 0015  . QUEtiapine (SEROQUEL) tablet 100 mg  100 mg Oral QHS Loletha Grayer, MD   100 mg at 01/25/19 0128    Musculoskeletal: Strength & Muscle Tone: decreased Gait & Station: normal Patient leans: N/A  Psychiatric Specialty Exam: Physical Exam  Nursing note and vitals reviewed. Constitutional: He is oriented to person, place, and time. He appears well-developed and well-nourished.  HENT:  Head: Normocephalic.  Neck: Normal range of motion.  Respiratory: Effort normal.  Musculoskeletal: Normal range of motion.  Neurological: He is alert and oriented to  person, place, and time.  Psychiatric: His speech is normal and behavior is normal. Judgment and thought content normal. His affect is blunt. Cognition and memory are normal. He exhibits a depressed mood.    Review of Systems  Neurological: Positive for weakness.  Psychiatric/Behavioral: Positive for depression and substance abuse.  All other systems reviewed and are negative.   Blood pressure 116/77, pulse 81, temperature 98.5 F (36.9 C), temperature source Oral, resp. rate 20, height 6\' 2"  (1.88 m), weight 83.3 kg, SpO2 100 %.Body mass index is 23.58 kg/m.  General Appearance: Casual  Eye Contact:  Good  Speech:  Normal Rate  Volume:  Normal  Mood:  Depressed  Affect:  Blunt  Thought Process:  Coherent and Descriptions of Associations: Intact  Orientation:  Full (Time, Place, and Person)  Thought Content:  WDL and Logical  Suicidal Thoughts:  Yes.  without  intent/plan  Homicidal Thoughts:  No  Memory:  Immediate;   Good Recent;   Good Remote;   Good  Judgement:  Fair  Insight:  Fair  Psychomotor Activity:  Decreased  Concentration:  Concentration: Fair and Attention Span: Fair  Recall:  [employer] of Knowledge:  Fair  Language:  Good  Akathisia:  No  Handed:  Right  AIMS (if indicated):     Assets:  Housing Leisure Time Physical Health Resilience Social Support  ADL's:  Intact  Cognition:  WNL  Sleep:      48 year old male admitted for hyponatremia and confusion, history of depression.  Well-known to this provider in ED for admissions for substance abuse with chronic suicidal ideations.  No intent or plan at this time.  Receives his care at [facility]  Positive for meth amphetamines, denies use.  Slightly elevated lithium level with no lithium noted on his home medications.  Patient is at his baseline.  Treatment Plan Summary: Daily contact with patient to assess and evaluate symptoms and progress in treatment, Medication management and Plan Major depressive disorder recurrent, moderate:  -Continue Cymbalta 20 mg daily -Continue Rexulti 3 mg daily -Continue Abilify 15 mg daily  Insomnia: -Continue Remeron 30 mg at bedtime -Continue Seroquel 100 mg at bedtime  Disposition: Supportive therapy provided about ongoing stressors.  Waylan Boga, NP 01/25/2019 2:50 PM

## 2019-01-26 DIAGNOSIS — T56891A Toxic effect of other metals, accidental (unintentional), initial encounter: Secondary | ICD-10-CM

## 2019-01-26 DIAGNOSIS — I10 Essential (primary) hypertension: Secondary | ICD-10-CM

## 2019-01-26 DIAGNOSIS — E119 Type 2 diabetes mellitus without complications: Secondary | ICD-10-CM

## 2019-01-26 LAB — BASIC METABOLIC PANEL
Anion gap: 9 (ref 5–15)
BUN: 10 mg/dL (ref 6–20)
CO2: 25 mmol/L (ref 22–32)
Calcium: 9 mg/dL (ref 8.9–10.3)
Chloride: 104 mmol/L (ref 98–111)
Creatinine, Ser: 0.8 mg/dL (ref 0.61–1.24)
GFR calc Af Amer: >60 mL/min (ref >60)
GFR calc non Af Amer: >60 mL/min (ref >60)
Glucose, Bld: 134 mg/dL — ABNORMAL HIGH (ref 70–99)
Potassium: 3.5 mmol/L (ref 3.5–5.1)
Sodium: 138 mmol/L (ref 135–145)

## 2019-01-26 LAB — LITHIUM LEVEL: Lithium Lvl: 0.65 mmol/L (ref 0.60–1.20)

## 2019-01-26 LAB — UREA NITROGEN, URINE: Urea Nitrogen, Ur: 126 mg/dL

## 2019-01-26 MED ORDER — QUETIAPINE FUMARATE 100 MG PO TABS: 100.0000 mg | ORAL_TABLET | Freq: Every day | ORAL | 0 refills | Status: DC

## 2019-01-26 MED ORDER — NICOTINE 21 MG/24HR TD PT24: MEDICATED_PATCH | TRANSDERMAL | 0 refills | Status: DC

## 2019-01-26 NOTE — Progress Notes (Signed)
Michael Schmitt to be D/C'd home per MD order.  Discussed prescriptions and follow up appointments with the patient. Prescriptions given to patient, medication list explained in detail. Pt verbalized understanding.  Allergies as of 01/26/2019      Reactions   Prednisone Other (See Comments)   Hypertension and makes him feel "spacey"   Amoxicillin Other (See Comments)   Other Reaction: ELEVATED BP   Hydrocodone Other (See Comments)   Causes headaches and makes the patient irritable   Varenicline Other (See Comments)   Suicidal thoughts   Trazodone And Nefazodone Anxiety, Other (See Comments)   Patient stated that this keeps him awake; "has an opposite effect on me"      Medication List    STOP taking these medications   busPIRone 10 MG tablet Commonly known as: BUSPAR   carbamazepine 200 MG tablet Commonly known as: TEGRETOL   cloNIDine 0.1 MG tablet Commonly known as: CATAPRES   gabapentin 300 MG capsule Commonly known as: NEURONTIN   glipiZIDE 5 MG 24 hr tablet Commonly known as: GLUCOTROL XL   LORazepam 2 MG tablet Commonly known as: ATIVAN     TAKE these medications   albuterol 108 (90 Base) MCG/ACT inhaler Commonly known as: VENTOLIN HFA Inhale 2 puffs into the lungs every 6 (six) hours as needed for wheezing.   ARIPiprazole 15 MG tablet Commonly known as: ABILIFY Take 15 mg by mouth daily.   aspirin 81 MG EC tablet Take 1 tablet (81 mg total) by mouth daily. For heart health   atorvastatin 10 MG tablet Commonly known as: LIPITOR Take 10 mg by mouth daily.   carvedilol 12.5 MG tablet Commonly known as: COREG Take 12.5 mg by mouth 2 (two) times daily with a meal.   clopidogrel 75 MG tablet Commonly known as: PLAVIX Take 75 mg by mouth daily.   DULoxetine 20 MG capsule Commonly known as: CYMBALTA Take 20 mg by mouth daily.   lamoTRIgine 150 MG tablet Commonly known as: LAMICTAL Take 150 mg by mouth daily.   lisinopril 10 MG tablet Commonly  known as: ZESTRIL Take 10 mg by mouth daily.   metFORMIN 1000 MG tablet Commonly known as: GLUCOPHAGE Take 1,000 mg by mouth 2 (two) times daily.   mirtazapine 30 MG tablet Commonly known as: REMERON Take 30 mg by mouth Nightly.   nicotine 21 mg/24hr patch Commonly known as: NICODERM CQ - dosed in mg/24 hours 21 mg chest wall daily (okay to substitute generic)   nitroGLYCERIN 0.4 MG SL tablet Commonly known as: NITROSTAT Place 0.4 mg under the tongue as needed.   QUEtiapine 100 MG tablet Commonly known as: SEROquel Take 1 tablet (100 mg total) by mouth at bedtime.   Rexulti 3 MG Tabs Generic drug: Brexpiprazole Take 3 mg by mouth daily.   sildenafil 20 MG tablet Commonly known as: Revatio Take 1 tablet (20 mg total) by mouth daily as needed.       Vitals:   01/25/19 2015 01/26/19 0436  BP: 121/77 117/75  Pulse: 76 69  Resp: 16 18  Temp: 98.2 F (36.8 C) 97.7 F (36.5 C)  SpO2: 97% 96%    Skin clean, dry and intact without evidence of skin break down, no evidence of skin tears noted. IV catheter discontinued intact. Site without signs and symptoms of complications. Dressing and pressure applied. Pt denies pain at this time. No complaints noted.  An After Visit Summary was printed and given to the patient. Patient escorted via  WC, and D/C home via private auto.  Fuller Mandril, RN

## 2019-01-26 NOTE — Discharge Instructions (Signed)
Hyponatremia Hyponatremia is when the amount of salt (sodium) in your blood is too low. When salt levels are low, your body may take in extra water. This can cause swelling throughout the body. The swelling often affects the brain. What are the causes? This condition may be caused by:  Certain medical problems or conditions.  Vomiting a lot.  Having watery poop (diarrhea) often.  Certain medicines or illegal drugs.  Not having enough water in the body (dehydration).  Drinking too much water.  Eating a diet that is low in salt.  Large burns on your body.  Too much sweating. What increases the risk? You are more likely to get this condition if you:  Have long-term (chronic) kidney disease.  Have heart failure.  Have a medical condition that causes you to have watery poop often.  Do very hard exercises.  Take medicines that affect the amount of salt is in your blood. What are the signs or symptoms? Symptoms of this condition include:  Headache.  Feeling like you may vomit (nausea).  Vomiting.  Being very tired (lethargic).  Muscle weakness and cramps.  Not wanting to eat as much as normal (loss of appetite).  Feeling weak or light-headed. Severe symptoms of this condition include:  Confusion.  Feeling restless (agitation).  Having a fast heart rate.  Passing out (fainting).  Seizures.  Coma. How is this treated? Treatment for this condition depends on the cause. Treatment may include:  Getting fluids through an IV tube that is put into one of your veins.  Taking medicines to fix the salt levels in your blood. If medicines are causing the problem, your medicines will need to be changed.  Limiting how much water or fluid you take in.  Monitoring in the hospital to watch your symptoms. Follow these instructions at home:   Take over-the-counter and prescription medicines only as told by your doctor. Many medicines can make this condition worse.  Talk with your doctor about any medicines that you are taking.  Eat and drink exactly as you are told by your doctor. ? Eat only the foods you are told to eat. ? Limit how much fluid you take.  Do not drink alcohol.  Keep all follow-up visits as told by your doctor. This is important. Contact a doctor if:  You feel more like you may vomit.  You feel more tired.  Your headache gets worse.  You feel more confused.  You feel weaker.  Your symptoms go away and then they come back.  You have trouble following the diet instructions. Get help right away if:  You have a seizure.  You pass out.  You keep having watery poop.  You keep vomiting. Summary  Hyponatremia is when the amount of salt in your blood is too low.  When salt levels are low, you can have swelling throughout the body. The swelling mostly affects the brain.  Treatment depends on the cause. Treatment may include getting IV fluids, medicines, or not drinking as much fluid. This information is not intended to replace advice given to you by your health care provider. Make sure you discuss any questions you have with your health care provider. Document Released: 11/23/2010 Document Revised: 05/30/2018 Document Reviewed: 02/14/2018 Elsevier Patient Education  2020 Elsevier Inc.  

## 2019-01-26 NOTE — Discharge Summary (Signed)
[place] [facility] [place] NAME: Michael Schmitt    MR#:  [id number]  DATE OF BIRTH:  February 09, 1971  DATE OF ADMISSION:  01/24/2019 ADMITTING PHYSICIAN: Loletha Grayer, MD  DATE OF DISCHARGE: 01/26/2019  1:09 PM  PRIMARY CARE PHYSICIAN: [place], [facility]    ADMISSION DIAGNOSIS:  Suicidal ideation [R45.851] Hyponatremia [E87.1] Lithium toxicity, accidental or unintentional, initial encounter [T56.891A]  DISCHARGE DIAGNOSIS:  Active Problems:   Chronic Suicidal ideation   Major depressive disorder, recurrent episode, moderate (HCC)   Hyponatremia   SECONDARY DIAGNOSIS:   Past Medical History:  Diagnosis Date  . Anxiety   . Anxiety   . Arthritis    cerv. stenosis, spondylosis, HNP- lower back , has been followed in pain clinic, has  had injection s in cerv. area  . Blood dyscrasia    told that when he was young he was a" free bleeder"  . CAD (coronary artery disease)   . Cervical spondylosis without myelopathy 07/24/2014  . Cervicogenic headache 07/24/2014  . Chronic kidney disease    renal calculi- passed spontaneously  . Depression   . Diabetes mellitus without complication ([place])   . Fatty liver   . GERD (gastroesophageal reflux disease)   . Headache(784.0)   . Hyperlipidemia   . Hypertension   . Mental disorder   . MI, old   . RLS (restless legs syndrome)    detected on sleep study  . Shortness of breath   . Ventricular fibrillation ([place]) 04/05/2016    HOSPITAL COURSE:   1.  Hyponatremia.  This has improved with IV fluid hydration and holding carbamazepine.  Sodium was 126 upon admission and 138 upon discharge. 2.  Elevated lithium level.  The patient was given IV fluid hydration and lithium was discontinued. 3.  Suicidal ideation on presentation to the emergency room.  The ER physician put in for IVC paperwork.  Appreciate psychiatric consultation.  He is well-known to their service.  They discontinue the IVC  paperwork.  The patient was going to sign out [place] this morning but I went to see him.  The psychiatrist had cleared him to go home.  He has a follow-up appointment tomorrow with Ellender Hose.  The patient states he cannot sleep without his Seroquel. 4.  Hypertension.  Continue to hold clonidine and continue other medications 5.  Type 2 diabetes.  Can go back on Glucophage.  Hold glipizide 6.  Hyperlipidemia continue statin. 7.  Tobacco abuse.  Prescribe nicotine patch upon discharge home.  DISCHARGE CONDITIONS:   Satisfactory  CONSULTS OBTAINED:  Psychiatry  DRUG ALLERGIES:   Allergies  Allergen Reactions  . Prednisone Other (See Comments)    Hypertension and makes him feel "spacey"  . Amoxicillin Other (See Comments)    Other Reaction: ELEVATED BP  . Hydrocodone Other (See Comments)    Causes headaches and makes the patient irritable  . Varenicline Other (See Comments)    Suicidal thoughts  . Trazodone And Nefazodone Anxiety and Other (See Comments)    Patient stated that this keeps him awake; "has an opposite effect on me"    DISCHARGE MEDICATIONS:   Allergies as of 01/26/2019      Reactions   Prednisone Other (See Comments)   Hypertension and makes him feel "spacey"   Amoxicillin Other (See Comments)   Other Reaction: ELEVATED BP   Hydrocodone Other (See Comments)   Causes headaches and makes the patient irritable   Varenicline Other (See  Comments)   Suicidal thoughts   Trazodone And Nefazodone Anxiety, Other (See Comments)   Patient stated that this keeps him awake; "has an opposite effect on me"      Medication List    STOP taking these medications   busPIRone 10 MG tablet Commonly known as: BUSPAR   carbamazepine 200 MG tablet Commonly known as: TEGRETOL   cloNIDine 0.1 MG tablet Commonly known as: CATAPRES   gabapentin 300 MG capsule Commonly known as: NEURONTIN   glipiZIDE 5 MG 24 hr tablet Commonly known as: GLUCOTROL XL   LORazepam  2 MG tablet Commonly known as: ATIVAN     TAKE these medications   albuterol 108 (90 Base) MCG/ACT inhaler Commonly known as: VENTOLIN HFA Inhale 2 puffs into the lungs every 6 (six) hours as needed for wheezing.   ARIPiprazole 15 MG tablet Commonly known as: ABILIFY Take 15 mg by mouth daily.   aspirin 81 MG EC tablet Take 1 tablet (81 mg total) by mouth daily. For heart health   atorvastatin 10 MG tablet Commonly known as: LIPITOR Take 10 mg by mouth daily.   carvedilol 12.5 MG tablet Commonly known as: COREG Take 12.5 mg by mouth 2 (two) times daily with a meal.   clopidogrel 75 MG tablet Commonly known as: PLAVIX Take 75 mg by mouth daily.   DULoxetine 20 MG capsule Commonly known as: CYMBALTA Take 20 mg by mouth daily.   lamoTRIgine 150 MG tablet Commonly known as: LAMICTAL Take 150 mg by mouth daily.   lisinopril 10 MG tablet Commonly known as: ZESTRIL Take 10 mg by mouth daily.   metFORMIN 1000 MG tablet Commonly known as: GLUCOPHAGE Take 1,000 mg by mouth 2 (two) times daily.   mirtazapine 30 MG tablet Commonly known as: REMERON Take 30 mg by mouth Nightly.   nicotine 21 mg/24hr patch Commonly known as: NICODERM CQ - dosed in mg/24 hours 21 mg chest wall daily (okay to substitute generic)   nitroGLYCERIN 0.4 MG SL tablet Commonly known as: NITROSTAT Place 0.4 mg under the tongue as needed.   QUEtiapine 100 MG tablet Commonly known as: SEROquel Take 1 tablet (100 mg total) by mouth at bedtime.   Rexulti 3 MG Tabs Generic drug: Brexpiprazole Take 3 mg by mouth daily.   sildenafil 20 MG tablet Commonly known as: Revatio Take 1 tablet (20 mg total) by mouth daily as needed.        DISCHARGE INSTRUCTIONS:   Follow-up PMD 5 days Follow-up psychiatry team as appointment already scheduled for tomorrow  If you experience worsening of your admission symptoms, develop shortness of breath, life threatening emergency, suicidal or homicidal  thoughts you must seek medical attention immediately by calling 911 or calling your MD immediately  if symptoms less severe.  You Must read complete instructions/literature along with all the possible adverse reactions/side effects for all the Medicines you take and that have been prescribed to you. Take any new Medicines after you have completely understood and accept all the possible adverse reactions/side effects.   Please note  You were cared for by a hospitalist during your hospital stay. If you have any questions about your discharge medications or the care you received while you were in the hospital after you are discharged, you can call the unit and asked to speak with the hospitalist on call if the hospitalist that took care of you is not available. Once you are discharged, your primary care physician will handle any further medical issues.  Please note that NO REFILLS for any discharge medications will be authorized once you are discharged, as it is imperative that you return to your primary care physician (or establish a relationship with a primary care physician if you do not have one) for your aftercare needs so that they can reassess your need for medications and monitor your lab values.    Today   CHIEF COMPLAINT:   Chief Complaint  Patient presents with  . Altered Mental Status  . Hallucinations  . Suicidal    HISTORY OF PRESENT ILLNESS:  Michael Schmitt  is a 48 y.o. male came in with suicidal ideation.  Was found to have low sodium.   VITAL SIGNS:  Blood pressure 117/75, pulse 69, temperature 97.7 F (36.5 C), temperature source Oral, resp. rate 18, height 6\' 2"  (1.88 m), weight 83.3 kg, SpO2 96 %.   PHYSICAL EXAMINATION:  GENERAL:  48 y.o.-year-old patient lying in the bed with no acute distress.  EYES: Pupils equal, round, reactive to light and accommodation. No scleral icterus. Extraocular muscles intact.  HEENT: Head atraumatic, normocephalic. Oropharynx and  nasopharynx clear.  NECK:  Supple, no jugular venous distention. No thyroid enlargement, no tenderness.  LUNGS: Normal breath sounds bilaterally, no wheezing, rales,rhonchi or crepitation. No use of accessory muscles of respiration.  CARDIOVASCULAR: S1, S2 normal. No murmurs, rubs, or gallops.  ABDOMEN: Soft, non-tender, non-distended. Bowel sounds present. No organomegaly or mass.  EXTREMITIES: No pedal edema, cyanosis, or clubbing.  NEUROLOGIC: Cranial nerves II through XII are intact. Muscle strength 5/5 in all extremities. Sensation intact.  Patient ambulated back and forth in the room and he was steady with his gait. PSYCHIATRIC: The patient is alert and oriented x 3.  SKIN: No obvious rash, lesion, or ulcer.   DATA REVIEW:   CBC Recent Labs  Lab 01/25/19 0710  WBC 8.2  HGB 13.2  HCT 38.3*  PLT 258    Chemistries  Recent Labs  Lab 01/24/19 1656  01/26/19 0555  NA 126*   < > 138  K 3.2*   < > 3.5  CL 91*   < > 104  CO2 24   < > 25  GLUCOSE 160*   < > 134*  BUN 6   < > 10  CREATININE 0.94   < > 0.80  CALCIUM 10.2   < > 9.0  AST 20  --   --   ALT 21  --   --   ALKPHOS 117  --   --   BILITOT 1.1  --   --    < > = values in this interval not displayed.     Microbiology Results  Results for orders placed or performed during the hospital encounter of 01/24/19  SARS Coronavirus 2 by RT PCR (hospital order, performed in [facility] hospital lab) Nasopharyngeal Nasopharyngeal Swab     Status: None   Collection Time: 01/24/19  6:46 PM   Specimen: Nasopharyngeal Swab  Result Value Ref Range Status   SARS Coronavirus 2 NEGATIVE NEGATIVE Final    Comment: (NOTE) If result is NEGATIVE SARS-CoV-2 target nucleic acids are NOT DETECTED. The SARS-CoV-2 RNA is generally detectable in upper and lower  respiratory specimens during the acute phase of infection. The lowest  concentration of SARS-CoV-2 viral copies this assay can detect is 250  copies / mL. A negative result does  not preclude SARS-CoV-2 infection  and should not be used as the sole basis for treatment or other  patient management decisions.  A negative result may occur with  improper specimen collection / handling, submission of specimen other  than nasopharyngeal swab, presence of viral mutation(s) within the  areas targeted by this assay, and inadequate number of viral copies  (<250 copies / mL). A negative result must be combined with clinical  observations, patient history, and epidemiological information. If result is POSITIVE SARS-CoV-2 target nucleic acids are DETECTED. The SARS-CoV-2 RNA is generally detectable in upper and lower  respiratory specimens dur ing the acute phase of infection.  Positive  results are indicative of active infection with SARS-CoV-2.  Clinical  correlation with patient history and other diagnostic information is  necessary to determine patient infection status.  Positive results do  not rule out bacterial infection or co-infection with other viruses. If result is PRESUMPTIVE POSTIVE SARS-CoV-2 nucleic acids MAY BE PRESENT.   A presumptive positive result was obtained on the submitted specimen  and confirmed on repeat testing.  While 2019 novel coronavirus  (SARS-CoV-2) nucleic acids may be present in the submitted sample  additional confirmatory testing may be necessary for epidemiological  and / or clinical management purposes  to differentiate between  SARS-CoV-2 and other Sarbecovirus currently known to infect humans.  If clinically indicated additional testing with an alternate test  methodology [phone]) is advised. The SARS-CoV-2 RNA is generally  detectable in upper and lower respiratory sp ecimens during the acute  phase of infection. The expected result is Negative. Fact Sheet for Patients:  StrictlyIdeas.no Fact Sheet for Healthcare Providers: BankingDealers.co.za This test is not yet approved or  cleared by the [place] FDA and has been authorized for detection and/or diagnosis of SARS-CoV-2 by FDA under an Emergency Use Authorization (EUA).  This EUA will remain in effect (meaning this test can be used) for the duration of the COVID-19 declaration under Section 564(b)(1) of the Act, 21 U.S.C. section 360bbb-3(b)(1), unless the authorization is terminated or revoked sooner. Performed at [facility], [address], [place], [place] [address]      Management plans discussed with the patient, family and they are in agreement.  CODE STATUS:     Code Status Orders  (From admission, onward)         Start     Ordered   01/24/19 1858  Full code  Continuous     01/24/19 1858        Code Status History    Date Active Date Inactive Code Status Order ID Comments User Context   05/17/2018 1722 05/20/2018 1604 Full Code [id number]  Gonzella Lex, MD Inpatient   04/17/2018 1233 04/18/2018 1539 Full Code [id number]  Duffy Bruce, MD ED   03/30/2018 2302 04/01/2018 1707 Full Code [id number]  Patience Musca, MD Inpatient   01/04/2018 1258 01/08/2018 1554 Full Code [id number]  Jillyn Ledger, PA-C ED   11/20/2017 1206 11/21/2017 1644 Full Code [id number]  Consuello Closs, NP Inpatient   11/19/2017 1308 11/20/2017 1142 Full Code [id number]  Sherwood Gambler, MD ED   07/08/2017 1833 07/09/2017 1201 Full Code [id number]  Petrucelli, Glynda Jaeger, PA-C ED   04/04/2017 1141 04/16/2017 2045 Full Code [id number]  Gonzella Lex, MD Inpatient   03/12/2017 2052 04/02/2017 1954 Full Code [id number]  Gonzella Lex, MD Inpatient   09/11/2016 2112 09/15/2016 1546 Full Code [id number]  Gonzella Lex, MD Inpatient   08/02/2016 1015 08/02/2016 1136 Full Code [id number]  Lindell Spar I, NP Lesleigh Noe   06/25/2016 2347 07/03/2016 1712  Full Code [id number]  Clovis Fredrickson, MD Inpatient   05/26/2016 2128 06/02/2016 1819 Full Code [id number]  Gonzella Lex, MD Inpatient   04/05/2016 0548 04/07/2016 1703 Full Code  [id number]  Belva Crome, MD Inpatient   04/05/2016 0424 04/05/2016 0548 Full Code [id number]  Cristina Gong, MD Inpatient   06/21/2015 0012 06/21/2015 1748 Full Code [id number]  [name], MD Inpatient   01/01/2015 2320 01/03/2015 1615 Full Code [id number]  [name], MD Inpatient   01/01/2015 2300 01/01/2015 2320 Full Code [id number]  Jay Schlichter, MD Inpatient   Advance Care Planning Activity      TOTAL TIME TAKING CARE OF THIS PATIENT: 35 minutes.    Loletha Grayer M.D on 01/26/2019 at 3:14 PM  Between 7am to 6pm - Pager - [phone]  After 6pm go to [email or web address] - password [email or web address]  [facility]  CC: Primary care physician; [facility]

## 2019-01-26 NOTE — Progress Notes (Signed)
Spoke with patient's sister via phone. Patient is being discharged. Sister was concerned for patient coming home and displaying the same behavior. Stated to the sister that patient has been medically cleared by physician. Discussed discharged instructions over the phone with sister. Per Sister she will be here at noon to pick patient up.   Fuller Mandril, RN

## 2019-02-02 ENCOUNTER — Other Ambulatory Visit: Payer: Self-pay

## 2019-02-02 ENCOUNTER — Emergency Department
Admission: EM | Admit: 2019-02-02 | Discharge: 2019-02-02 | Disposition: A | Discharge status: Home / Self Care | Payer: Medicare Other | Attending: Emergency Medicine | Admitting: Emergency Medicine

## 2019-02-02 ENCOUNTER — Emergency Department: Payer: Medicare Other

## 2019-02-02 DIAGNOSIS — J45909 Unspecified asthma, uncomplicated: Secondary | ICD-10-CM | POA: Insufficient documentation

## 2019-02-02 DIAGNOSIS — S42032D Displaced fracture of lateral end of left clavicle, subsequent encounter for fracture with routine healing: Secondary | ICD-10-CM | POA: Diagnosis not present

## 2019-02-02 DIAGNOSIS — I129 Hypertensive chronic kidney disease with stage 1 through stage 4 chronic kidney disease, or unspecified chronic kidney disease: Secondary | ICD-10-CM | POA: Insufficient documentation

## 2019-02-02 DIAGNOSIS — F17210 Nicotine dependence, cigarettes, uncomplicated: Secondary | ICD-10-CM | POA: Insufficient documentation

## 2019-02-02 DIAGNOSIS — Z79899 Other long term (current) drug therapy: Secondary | ICD-10-CM | POA: Insufficient documentation

## 2019-02-02 DIAGNOSIS — S299XXA Unspecified injury of thorax, initial encounter: Secondary | ICD-10-CM | POA: Diagnosis not present

## 2019-02-02 DIAGNOSIS — Z7984 Long term (current) use of oral hypoglycemic drugs: Secondary | ICD-10-CM | POA: Diagnosis not present

## 2019-02-02 DIAGNOSIS — I2510 Atherosclerotic heart disease of native coronary artery without angina pectoris: Secondary | ICD-10-CM | POA: Diagnosis not present

## 2019-02-02 DIAGNOSIS — E1122 Type 2 diabetes mellitus with diabetic chronic kidney disease: Secondary | ICD-10-CM | POA: Insufficient documentation

## 2019-02-02 DIAGNOSIS — S42032A Displaced fracture of lateral end of left clavicle, initial encounter for closed fracture: Secondary | ICD-10-CM

## 2019-02-02 DIAGNOSIS — W1789XD Other fall from one level to another, subsequent encounter: Secondary | ICD-10-CM | POA: Diagnosis not present

## 2019-02-02 DIAGNOSIS — M545 Low back pain: Secondary | ICD-10-CM | POA: Diagnosis not present

## 2019-02-02 DIAGNOSIS — S3992XA Unspecified injury of lower back, initial encounter: Secondary | ICD-10-CM | POA: Diagnosis not present

## 2019-02-02 DIAGNOSIS — R0781 Pleurodynia: Secondary | ICD-10-CM | POA: Diagnosis not present

## 2019-02-02 DIAGNOSIS — N189 Chronic kidney disease, unspecified: Secondary | ICD-10-CM | POA: Insufficient documentation

## 2019-02-02 MED ORDER — LIDOCAINE 5 % EX PTCH: 1.0000 | MEDICATED_PATCH | CUTANEOUS | 0 refills | Status: DC

## 2019-02-02 MED ORDER — IBUPROFEN 600 MG PO TABS: 600.0000 mg | ORAL_TABLET | Freq: Four times a day (QID) | ORAL | 0 refills | Status: DC | PRN

## 2019-02-02 NOTE — ED Notes (Signed)
Pt fell off of porch 1 week ago - c/o left shoulder pain and lower back pain  Pt reports that he falls frequently (1x week) - he reports that he "blacks out after he starts falling" - he is unable to recall if he hit head - denies blurred vision, dizziness, headache

## 2019-02-02 NOTE — Discharge Instructions (Addendum)
Please wear shoulder sling. Take ibuprofen for pain and inflammation. Please follow up with ortho for recheck.

## 2019-02-02 NOTE — ED Provider Notes (Signed)
[facility] Emergency Department Provider Note  ____________________________________________  Time seen: Approximately 11:00 AM  I have reviewed the triage vital signs and the nursing notes.   HISTORY  Chief Complaint Fall    HPI Michael Schmitt is a 48 y.o. male that presents to emergency department for evaluation of continued left shoulder, left rib, low back pain following a fall 10 days ago.  Patient states that he fell around Oct 27 prior to presenting to the emergency department.  He fell about 2 feet off of a porch.  He remembers the fall.  After the fall he began to start having this pain.  He has a bruise to his shoulder and his low back.  He is having more pain around his left shoulder than his low back.  His low back pain feels superficial just around the bruise and does not feel deeper than this.  No bowel or bladder dysfunction or saddle anesthesias.  He was evaluated in the emergency department on October 27 for an elevated lithium level and again on October 30 for altered mental status and suicidal ideations.  Patient denies any further suicidal ideations.  He has not followed up with psychiatry.  He does not wish to talk to psychiatry today. He is here today with his sister.  Patient denies any falls since that fall in October.  He did not want to come today but his sister made him due to his continued pain.  He has had chronic neck pain after a previous surgery but none following his fall.  He has been taking Goody powders.  No headache, confusion, shortness of breath, chest pain, abdominal pain.  Past Medical History:  Diagnosis Date  . Anxiety   . Anxiety   . Arthritis    cerv. stenosis, spondylosis, HNP- lower back , has been followed in pain clinic, has  had injection s in cerv. area  . Blood dyscrasia    told that when he was young he was a" free bleeder"  . CAD (coronary artery disease)   . Cervical spondylosis without myelopathy 07/24/2014   . Cervicogenic headache 07/24/2014  . Chronic kidney disease    renal calculi- passed spontaneously  . Depression   . Diabetes mellitus without complication ([place])   . Fatty liver   . GERD (gastroesophageal reflux disease)   . Headache(784.0)   . Hyperlipidemia   . Hypertension   . Mental disorder   . MI, old   . RLS (restless legs syndrome)    detected on sleep study  . Shortness of breath   . Ventricular fibrillation ([place]) 04/05/2016    Patient Active Problem List   Diagnosis Date Noted  . Lithium toxicity   . Hyponatremia 01/24/2019  . Substance induced mood disorder ([place]) 11/22/2018  . Alcohol abuse with alcohol-induced mood disorder ([place]) 09/21/2018  . Overdose 05/17/2018  . Depression, major, recurrent, severe with psychosis ([place]) 03/30/2018  . Personality disorder ([place]) 12/31/2017  . MDD (major depressive disorder), recurrent episode, severe ([place]) 11/20/2017  . Dysthymia 10/26/2017  . Major depressive disorder, recurrent episode, moderate ([place]) 03/10/2017  . Generalized anxiety disorder 02/05/2017  . Chronic neck pain (Primary Area of Pain) (Left) 11/22/2016  . Cervical facet syndrome (Left) 11/22/2016  . Cervical radiculitis (Left) 11/22/2016  . History of cervical spinal surgery 11/22/2016  . Cervical spondylosis 11/22/2016  . Chronic shoulder pain (Secondary Area of Pain) (Left) 11/22/2016  . Arthropathy of shoulder (Left) 11/22/2016  . Chronic low back pain (  Tertiary Area of Pain) (Bilateral) (L>R) 11/22/2016  . Lumbar facet syndrome (Bilateral) (L>R) 11/22/2016  . Chronic pain syndrome 11/21/2016  . Long term prescription benzodiazepine use 11/21/2016  . Opiate use 11/21/2016  . Type 2 diabetes mellitus without complication, without long-term current use of insulin ([place]) 10/19/2016  . Chronic Suicidal ideation 09/15/2016  . Coronary artery disease 09/12/2016  . HTN (hypertension) 06/26/2016  . GERD (gastroesophageal reflux disease) 06/26/2016  . Alcohol use  disorder, severe, 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[place]) 06/26/2016  . Benzodiazepine abuse ([place]) 06/11/2016  . Sedative, hypnotic or anxiolytic use disorder, severe, dependence ([place]) 05/12/2016  . Ventricular fibrillation ([place]) 04/05/2016  . Combined hyperlipidemia 01/05/2016  . Cervical spondylosis without myelopathy 07/24/2014  . Severe episode of recurrent major depressive disorder, without psychotic features ([place]) 04/15/2013  . Tobacco use disorder 10/20/2010  . Asthma 10/20/2010    Past Surgical History:  Procedure Laterality Date  . ANTERIOR CERVICAL DECOMP/DISCECTOMY FUSION  11/13/2011   Procedure: ANTERIOR CERVICAL DECOMPRESSION/DISCECTOMY FUSION 1 LEVEL;  Surgeon: [name], MD;  Location: [place] [facility];  Service: Neurosurgery;  Laterality: N/A;  Anterior Cervical Decompression/discectomy Fusion. Cervical three-four.  Marland Kitchen CARDIAC CATHETERIZATION N/A 01/01/2015   Procedure: Left Heart Cath and Coronary Angiography;  Surgeon: [name], MD;  Location: [place] [facility];  Service: Cardiovascular;  Laterality: N/A;  . CARDIAC CATHETERIZATION N/A 04/05/2016   Procedure: Left Heart Cath and Coronary Angiography;  Surgeon: Belva Crome, MD;  Location: [place] [facility];  Service: Cardiovascular;  Laterality: N/A;  . CARDIAC CATHETERIZATION N/A 04/05/2016   Procedure: Coronary Stent Intervention;  Surgeon: Belva Crome, MD;  Location: [place] [facility];  Service: Cardiovascular;  Laterality: N/A;  . CARDIAC CATHETERIZATION N/A 04/05/2016   Procedure: Intravascular Ultrasound/IVUS;  Surgeon: Belva Crome, MD;  Location: [place] [facility];  Service: Cardiovascular;  Laterality: N/A;  . NASAL SINUS SURGERY     2005    Prior to Admission medications   Medication Sig Start Date End Date Taking? Authorizing Provider  albuterol (PROVENTIL HFA;VENTOLIN HFA) 108 (90 Base) MCG/ACT inhaler Inhale 2 puffs into the lungs every 6 (six) hours as needed for wheezing. 04/11/18 04/11/19  [provider]   ARIPiprazole (ABILIFY) 15 MG tablet Take 15 mg by mouth daily. 10/14/18   [provider]  aspirin 81 MG EC tablet Take 1 tablet (81 mg total) by mouth daily. For heart health Patient not taking: Reported on 01/24/2019 11/21/17   Lindell Spar I, NP  atorvastatin (LIPITOR) 10 MG tablet Take 10 mg by mouth daily. 10/25/18 10/25/19  [provider]  carvedilol (COREG) 12.5 MG tablet Take 12.5 mg by mouth 2 (two) times daily with a meal. 10/25/18 10/25/19  [provider]  clopidogrel (PLAVIX) 75 MG tablet Take 75 mg by mouth daily. 03/12/18 03/12/19  [provider]  DULoxetine (CYMBALTA) 20 MG capsule Take 20 mg by mouth daily. 08/05/18   [provider]  ibuprofen (ADVIL) 600 MG tablet Take 1 tablet (600 mg total) by mouth every 6 (six) hours as needed. 02/02/19   Laban Emperor, PA-C  lamoTRIgine (LAMICTAL) 150 MG tablet Take 150 mg by mouth daily. 07/17/18   [provider]  lidocaine (LIDODERM) 5 % Place 1 patch onto the skin daily. Remove & Discard patch within 12 hours or as directed by MD 02/02/19   Laban Emperor, PA-C  lisinopril (ZESTRIL) 10 MG tablet Take 10 mg by mouth daily. 10/15/18 10/15/19  [provider]  metFORMIN (GLUCOPHAGE) 1000 MG tablet Take 1,000 mg by mouth 2 (two) times daily. 10/02/18   [provider]  mirtazapine (REMERON) 30 MG tablet Take 30 mg by mouth Nightly. 10/14/18   [provider]  nicotine (NICODERM CQ - DOSED IN MG/24 HOURS) 21 mg/24hr patch 21 mg chest wall daily (okay to substitute generic) 01/26/19   Loletha Grayer, MD  nitroGLYCERIN (NITROSTAT) 0.4 MG SL tablet Place 0.4 mg under the tongue as needed. 06/03/18   [provider]  QUEtiapine (SEROQUEL) 100 MG tablet Take 1 tablet (100 mg total) by mouth at bedtime. 01/26/19   Wieting, Richard, MD  REXULTI 3 MG TABS Take 3 mg by mouth daily. 09/03/18   [provider]  sildenafil (REVATIO) 20 MG tablet Take 1 tablet (20 mg  total) by mouth daily as needed. 10/15/18   Billey Co, MD    Allergies Prednisone, Amoxicillin, Hydrocodone, Varenicline, and Trazodone and nefazodone  Family History  Problem Relation Age of Onset  . Prostate cancer Father   . Hypertension Mother   . Kidney Stones Mother   . Anxiety disorder Mother   . Depression Mother   . COPD Sister   . Hypertension Sister   . Diabetes Sister   . Depression Sister   . Anxiety disorder Sister   . Seizures Sister   . ADD / ADHD Son   . ADD / ADHD Daughter     Social History Social History   Tobacco Use  . Smoking status: Current Every Day Smoker    Packs/day: 2.00    Years: 17.00    Pack years: 34.00    Types: Cigarettes, Cigars  . Smokeless tobacco: Former Systems developer  . Tobacco comment: occassional snuff   Substance Use Topics  . Alcohol use: No    Alcohol/week: 0.0 standard drinks    Comment: last drinked in 3 months.   . Drug use: No     Review of Systems  Cardiovascular: No chest pain. Respiratory: No cough. No SOB. Gastrointestinal: No abdominal pain.  No nausea, no vomiting.  Musculoskeletal: Positive for shoulder, rib, back pain. Skin: Negative for rash, abrasions, lacerations.  Positive for ecchymosis. Neurological: Negative for headaches, numbness or tingling   ____________________________________________   PHYSICAL EXAM:  VITAL SIGNS: ED Triage Vitals  Enc Vitals Group     BP 02/02/19 1005 (!) 142/92     Pulse Rate 02/02/19 1005 96     Resp 02/02/19 1005 18     Temp 02/02/19 1005 98.2 F (36.8 C)     Temp Source 02/02/19 1005 Oral     SpO2 02/02/19 1005 97 %     Weight 02/02/19 1006 200 lb (90.7 kg)     Height 02/02/19 1006 6\' 2"  (1.88 m)     Head Circumference --      Peak Flow --      Pain Score 02/02/19 1005 7     Pain Loc --      Pain Edu? --      Excl. in [place]? --      Constitutional: Alert and oriented. Well appearing and in no acute distress. Eyes: Conjunctivae are normal. PERRL.  EOMI. Head: Atraumatic. ENT:      Ears:      Nose: No congestion/rhinnorhea.      Mouth/Throat: Mucous membranes are moist.  Neck: No stridor.  No cervical spine tenderness to palpation. Cardiovascular: Normal rate, regular rhythm.  Good peripheral circulation. Respiratory: Normal respiratory effort without tachypnea or  retractions. Lungs CTAB. Good air entry to the bases with no decreased or absent breath sounds. Gastrointestinal: Bowel sounds 4 quadrants. Soft and nontender to palpation. No guarding or rigidity. No palpable masses. No distention.  Musculoskeletal: Full range of motion to all extremities. No gross deformities appreciated.  Full range of motion of shoulder.  Tenderness to palpation to left shoulder, left lateral rib cage, lumbar spine.  Normal gait. Neurologic:  Normal speech and language. No gross focal neurologic deficits are appreciated.  Skin:  Skin is warm, dry.  Ecchymosis to left shoulder and low back. Psychiatric: Mood and affect are normal. Speech and behavior are normal. Patient exhibits appropriate insight and judgement.   ____________________________________________   LABS (all labs ordered are listed, but only abnormal results are displayed)  Labs Reviewed - No data to display ____________________________________________  EKG   ____________________________________________  RADIOLOGY Robinette Haines, personally viewed and evaluated these images (plain radiographs) as part of my medical decision making, as well as reviewing the written report by the radiologist.  Dg Ribs Unilateral W/chest Left  Result Date: 02/02/2019 CLINICAL DATA:  [place] 10(?) days ago, still has upper left shoulder/ribs pain and lower back pain. No bruising to upper chest or shoulder. Bruising across lower back across sacrum area. EXAM: LEFT RIBS AND CHEST - 3+ VIEW COMPARISON:  Chest radiograph 01/21/2019 FINDINGS: No evidence of a displaced left-sided rib fracture. There is no  evidence of pneumothorax or pleural effusion. Both lungs are clear. Heart size and mediastinal contours are within normal limits. Fracture of the distal left clavicle as seen on concurrent shoulder radiographs. IMPRESSION: 1. No evidence of a displaced left-sided rib fracture. 2. No acute cardiopulmonary findings. Electronically Signed   By: Audie Pinto M.D.   On: 02/02/2019 12:19   Dg Lumbar Spine 2-3 Views  Result Date: 02/02/2019 CLINICAL DATA:  [place] 10(?) days ago, still has upper left shoulder/ribs pain and lower back pain. No bruising to upper chest or shoulder. Bruising across lower back across sacrum area. No leg or lower extremity pain or weakness. EXAM: LUMBAR SPINE - 2-3 VIEW COMPARISON:  CT abdomen pelvis 11/20/2018 FINDINGS: Five non-rib-bearing lumbar type vertebral bodies. Normal alignment. Vertebral body heights and intervertebral disc spaces are well maintained. No significant degenerative changes. SI joints are open. Nonobstructive bowel gas pattern. IMPRESSION: Negative lumbar spine radiographs. Please note radiographs are relatively insensitive for the detection of subtle spinal fractures. If there is persistent clinical concern consider obtaining cross-sectional imaging. Electronically Signed   By: Audie Pinto M.D.   On: 02/02/2019 12:22   Dg Shoulder Left  Result Date: 02/02/2019 CLINICAL DATA:  [place] 10(?) days ago, still has upper left shoulder/ribs pain and lower back pain. No bruising to upper chest or shoulder. Bruising across lower back across sacrum area. EXAM: LEFT SHOULDER - 2+ VIEW COMPARISON:  None. FINDINGS: There is a mildly displaced fracture of the distal left clavicle. No evidence of dislocation. Normal glenohumeral alignment. No significant degenerative change. Regional soft tissues are unremarkable. IMPRESSION: Mildly displaced fracture of the distal left clavicle. Electronically Signed   By: Audie Pinto M.D.   On: 02/02/2019 12:17     ____________________________________________    PROCEDURES  Procedure(s) performed:    Procedures    Medications - No data to display   ____________________________________________   INITIAL IMPRESSION / ASSESSMENT AND PLAN / ED COURSE  Pertinent labs & imaging results that were available during my care of the patient were reviewed by me and considered in  my medical decision making (see chart for details).  Review of the [place] CSRS was performed in accordance of the [place] prior to dispensing any controlled drugs.     Patient's diagnosis is consistent with mildly displaced fracture of the left distal clavicle.  Vital signs and exam are reassuring.  X-ray consistent with clavicle fracture.  Shoulder sling was given.  Patient will be discharged home with prescriptions for Motrin and Lidoderm patch. Patient is to follow up with orthopedics as directed. Patient is given ED precautions to return to the ED for any worsening or new symptoms.  Nahsir Iuliano was evaluated in Emergency Department on 02/02/2019 for the symptoms described in the history of present illness. He was evaluated in the context of the global COVID-19 pandemic, which necessitated consideration that the patient might be at risk for infection with the SARS-CoV-2 virus that causes COVID-19. Institutional protocols and algorithms that pertain to the evaluation of patients at risk for COVID-19 are in a state of rapid change based on information released by regulatory bodies including the CDC and federal and state organizations. These policies and algorithms were followed during the patient's care in the ED.   ____________________________________________  FINAL CLINICAL IMPRESSION(S) / ED DIAGNOSES  Final diagnoses:  Closed displaced fracture of acromial end of left clavicle, initial encounter      NEW MEDICATIONS STARTED DURING THIS VISIT:  ED Discharge Orders         Ordered    ibuprofen (ADVIL) 600 MG  tablet  Every 6 hours PRN     02/02/19 1258    lidocaine (LIDODERM) 5 %  Every 24 hours     02/02/19 1258              This chart was dictated using voice recognition software/Dragon. Despite best efforts to proofread, errors can occur which can change the meaning. Any change was purely unintentional.    Laban Emperor, PA-C 02/02/19 1721    Lilia Pro., MD 02/03/19 [phone]

## 2019-02-02 NOTE — ED Triage Notes (Signed)
Pt states he fell off 77ft porch a week ago and is having left rib, shoulder and lower back pain . Pt is ambulatory to triage without difficulty.

## 2019-02-04 DIAGNOSIS — Z7984 Long term (current) use of oral hypoglycemic drugs: Secondary | ICD-10-CM | POA: Diagnosis not present

## 2019-02-04 DIAGNOSIS — Z825 Family history of asthma and other chronic lower respiratory diseases: Secondary | ICD-10-CM | POA: Diagnosis not present

## 2019-02-04 DIAGNOSIS — I252 Old myocardial infarction: Secondary | ICD-10-CM | POA: Diagnosis not present

## 2019-02-04 DIAGNOSIS — I120 Hypertensive chronic kidney disease with stage 5 chronic kidney disease or end stage renal disease: Secondary | ICD-10-CM | POA: Diagnosis not present

## 2019-02-04 DIAGNOSIS — Z79899 Other long term (current) drug therapy: Secondary | ICD-10-CM | POA: Diagnosis not present

## 2019-02-04 DIAGNOSIS — S6991XA Unspecified injury of right wrist, hand and finger(s), initial encounter: Secondary | ICD-10-CM | POA: Diagnosis not present

## 2019-02-04 DIAGNOSIS — Z7982 Long term (current) use of aspirin: Secondary | ICD-10-CM | POA: Diagnosis not present

## 2019-02-04 DIAGNOSIS — E1122 Type 2 diabetes mellitus with diabetic chronic kidney disease: Secondary | ICD-10-CM | POA: Diagnosis not present

## 2019-02-04 DIAGNOSIS — Z981 Arthrodesis status: Secondary | ICD-10-CM | POA: Diagnosis not present

## 2019-02-04 DIAGNOSIS — I2510 Atherosclerotic heart disease of native coronary artery without angina pectoris: Secondary | ICD-10-CM | POA: Diagnosis not present

## 2019-02-04 DIAGNOSIS — Z823 Family history of stroke: Secondary | ICD-10-CM | POA: Diagnosis not present

## 2019-02-04 DIAGNOSIS — G479 Sleep disorder, unspecified: Secondary | ICD-10-CM | POA: Diagnosis not present

## 2019-02-04 DIAGNOSIS — Z7902 Long term (current) use of antithrombotics/antiplatelets: Secondary | ICD-10-CM | POA: Diagnosis not present

## 2019-02-04 DIAGNOSIS — R451 Restlessness and agitation: Secondary | ICD-10-CM | POA: Diagnosis not present

## 2019-02-04 DIAGNOSIS — N186 End stage renal disease: Secondary | ICD-10-CM | POA: Diagnosis not present

## 2019-02-04 DIAGNOSIS — Z880 Allergy status to penicillin: Secondary | ICD-10-CM | POA: Diagnosis not present

## 2019-02-04 DIAGNOSIS — I499 Cardiac arrhythmia, unspecified: Secondary | ICD-10-CM | POA: Diagnosis not present

## 2019-02-04 DIAGNOSIS — J45909 Unspecified asthma, uncomplicated: Secondary | ICD-10-CM | POA: Diagnosis not present

## 2019-02-09 DIAGNOSIS — R451 Restlessness and agitation: Secondary | ICD-10-CM | POA: Diagnosis not present

## 2019-02-09 DIAGNOSIS — G479 Sleep disorder, unspecified: Secondary | ICD-10-CM | POA: Diagnosis not present

## 2019-02-24 DIAGNOSIS — Z79899 Other long term (current) drug therapy: Secondary | ICD-10-CM | POA: Diagnosis not present

## 2019-03-05 DIAGNOSIS — M542 Cervicalgia: Secondary | ICD-10-CM | POA: Diagnosis not present

## 2019-03-08 ENCOUNTER — Other Ambulatory Visit: Payer: Self-pay

## 2019-03-08 ENCOUNTER — Emergency Department
Admission: EM | Admit: 2019-03-08 | Discharge: 2019-03-08 | Disposition: A | Discharge status: Home / Self Care | Payer: Medicare Other | Attending: Emergency Medicine | Admitting: Emergency Medicine

## 2019-03-08 DIAGNOSIS — R519 Headache, unspecified: Secondary | ICD-10-CM | POA: Diagnosis not present

## 2019-03-08 DIAGNOSIS — Z5321 Procedure and treatment not carried out due to patient leaving prior to being seen by health care provider: Secondary | ICD-10-CM | POA: Diagnosis not present

## 2019-03-08 NOTE — ED Notes (Signed)
This tech in triage room with pt to obtain vitals, pt asking about wait time and this tech explained to pt that we were unable to give him a wait time but our longest wait at this moment was 2.5 hours and advised him of how many pt's were in front of him. Pt became angry and told this tech "I don't have time to sit in this shit hole and wait 3 hours" this tech advised pt that we would be happy to see him but if he didn't want to wait then he was free to leave anytime just let the first nurse know. Pt demanding to go "straight back" this tech advised him that he would not be taken straight back to a room. Levada Dy first RN made aware

## 2019-03-08 NOTE — ED Notes (Signed)
First Nurse Note: Pt to ED c/o headaches. Pt is in NAD.

## 2019-03-13 DIAGNOSIS — G8929 Other chronic pain: Secondary | ICD-10-CM | POA: Diagnosis not present

## 2019-03-13 DIAGNOSIS — M542 Cervicalgia: Secondary | ICD-10-CM | POA: Diagnosis not present

## 2019-03-19 DIAGNOSIS — M79642 Pain in left hand: Secondary | ICD-10-CM | POA: Diagnosis not present

## 2019-04-02 DIAGNOSIS — E319 Polyglandular dysfunction, unspecified: Secondary | ICD-10-CM | POA: Diagnosis not present

## 2019-04-02 DIAGNOSIS — I10 Essential (primary) hypertension: Secondary | ICD-10-CM | POA: Diagnosis not present

## 2019-04-02 DIAGNOSIS — E119 Type 2 diabetes mellitus without complications: Secondary | ICD-10-CM | POA: Diagnosis not present

## 2019-04-21 DIAGNOSIS — U071 COVID-19: Secondary | ICD-10-CM | POA: Diagnosis not present

## 2019-04-21 DIAGNOSIS — Z8674 Personal history of sudden cardiac arrest: Secondary | ICD-10-CM | POA: Diagnosis not present

## 2019-04-21 DIAGNOSIS — R7401 Elevation of levels of liver transaminase levels: Secondary | ICD-10-CM | POA: Diagnosis not present

## 2019-04-21 DIAGNOSIS — G2401 Drug induced subacute dyskinesia: Secondary | ICD-10-CM | POA: Diagnosis not present

## 2019-04-21 DIAGNOSIS — I252 Old myocardial infarction: Secondary | ICD-10-CM | POA: Diagnosis not present

## 2019-04-21 DIAGNOSIS — G8929 Other chronic pain: Secondary | ICD-10-CM | POA: Diagnosis not present

## 2019-04-21 DIAGNOSIS — E871 Hypo-osmolality and hyponatremia: Secondary | ICD-10-CM | POA: Diagnosis not present

## 2019-04-21 DIAGNOSIS — Z7982 Long term (current) use of aspirin: Secondary | ICD-10-CM | POA: Diagnosis not present

## 2019-04-21 DIAGNOSIS — I110 Hypertensive heart disease with heart failure: Secondary | ICD-10-CM | POA: Diagnosis not present

## 2019-04-21 DIAGNOSIS — E782 Mixed hyperlipidemia: Secondary | ICD-10-CM | POA: Diagnosis not present

## 2019-04-21 DIAGNOSIS — R000 Tachycardia, unspecified: Secondary | ICD-10-CM | POA: Diagnosis not present

## 2019-04-21 DIAGNOSIS — E119 Type 2 diabetes mellitus without complications: Secondary | ICD-10-CM | POA: Diagnosis not present

## 2019-04-21 DIAGNOSIS — I5032 Chronic diastolic (congestive) heart failure: Secondary | ICD-10-CM | POA: Diagnosis not present

## 2019-04-21 DIAGNOSIS — E861 Hypovolemia: Secondary | ICD-10-CM | POA: Diagnosis not present

## 2019-04-21 DIAGNOSIS — Z9861 Coronary angioplasty status: Secondary | ICD-10-CM | POA: Diagnosis not present

## 2019-04-21 DIAGNOSIS — D72829 Elevated white blood cell count, unspecified: Secondary | ICD-10-CM | POA: Diagnosis not present

## 2019-04-21 DIAGNOSIS — M542 Cervicalgia: Secondary | ICD-10-CM | POA: Diagnosis not present

## 2019-04-21 DIAGNOSIS — R918 Other nonspecific abnormal finding of lung field: Secondary | ICD-10-CM | POA: Diagnosis not present

## 2019-04-21 DIAGNOSIS — R112 Nausea with vomiting, unspecified: Secondary | ICD-10-CM | POA: Diagnosis not present

## 2019-04-21 DIAGNOSIS — I2510 Atherosclerotic heart disease of native coronary artery without angina pectoris: Secondary | ICD-10-CM | POA: Diagnosis not present

## 2019-04-21 DIAGNOSIS — Z7902 Long term (current) use of antithrombotics/antiplatelets: Secondary | ICD-10-CM | POA: Diagnosis not present

## 2019-04-21 DIAGNOSIS — Z79899 Other long term (current) drug therapy: Secondary | ICD-10-CM | POA: Diagnosis not present

## 2019-04-22 DIAGNOSIS — U071 COVID-19: Secondary | ICD-10-CM | POA: Diagnosis not present

## 2019-04-22 DIAGNOSIS — I2510 Atherosclerotic heart disease of native coronary artery without angina pectoris: Secondary | ICD-10-CM | POA: Diagnosis not present

## 2019-04-23 DIAGNOSIS — I2510 Atherosclerotic heart disease of native coronary artery without angina pectoris: Secondary | ICD-10-CM | POA: Diagnosis not present

## 2019-04-23 DIAGNOSIS — U071 COVID-19: Secondary | ICD-10-CM | POA: Diagnosis not present

## 2019-04-23 DIAGNOSIS — E782 Mixed hyperlipidemia: Secondary | ICD-10-CM | POA: Diagnosis not present

## 2019-04-24 DIAGNOSIS — U071 COVID-19: Secondary | ICD-10-CM | POA: Diagnosis not present

## 2019-04-24 DIAGNOSIS — E782 Mixed hyperlipidemia: Secondary | ICD-10-CM | POA: Diagnosis not present

## 2019-04-24 DIAGNOSIS — I2510 Atherosclerotic heart disease of native coronary artery without angina pectoris: Secondary | ICD-10-CM | POA: Diagnosis not present

## 2019-04-25 DIAGNOSIS — U071 COVID-19: Secondary | ICD-10-CM | POA: Diagnosis not present

## 2019-04-25 DIAGNOSIS — I2510 Atherosclerotic heart disease of native coronary artery without angina pectoris: Secondary | ICD-10-CM | POA: Diagnosis not present

## 2019-04-25 DIAGNOSIS — E782 Mixed hyperlipidemia: Secondary | ICD-10-CM | POA: Diagnosis not present

## 2019-04-26 DIAGNOSIS — I2510 Atherosclerotic heart disease of native coronary artery without angina pectoris: Secondary | ICD-10-CM | POA: Diagnosis not present

## 2019-04-26 DIAGNOSIS — U071 COVID-19: Secondary | ICD-10-CM | POA: Diagnosis not present

## 2019-04-27 DIAGNOSIS — I2510 Atherosclerotic heart disease of native coronary artery without angina pectoris: Secondary | ICD-10-CM | POA: Diagnosis not present

## 2019-04-27 DIAGNOSIS — U071 COVID-19: Secondary | ICD-10-CM | POA: Diagnosis not present

## 2019-05-13 DIAGNOSIS — I2510 Atherosclerotic heart disease of native coronary artery without angina pectoris: Secondary | ICD-10-CM | POA: Diagnosis not present

## 2019-05-13 DIAGNOSIS — G4700 Insomnia, unspecified: Secondary | ICD-10-CM | POA: Diagnosis not present

## 2019-05-13 DIAGNOSIS — E119 Type 2 diabetes mellitus without complications: Secondary | ICD-10-CM | POA: Diagnosis not present

## 2019-05-13 DIAGNOSIS — I10 Essential (primary) hypertension: Secondary | ICD-10-CM | POA: Diagnosis not present

## 2019-05-13 DIAGNOSIS — E785 Hyperlipidemia, unspecified: Secondary | ICD-10-CM | POA: Diagnosis not present

## 2019-05-13 DIAGNOSIS — Z7984 Long term (current) use of oral hypoglycemic drugs: Secondary | ICD-10-CM | POA: Diagnosis not present

## 2019-05-14 DIAGNOSIS — I10 Essential (primary) hypertension: Secondary | ICD-10-CM | POA: Diagnosis not present

## 2019-05-14 DIAGNOSIS — E119 Type 2 diabetes mellitus without complications: Secondary | ICD-10-CM | POA: Diagnosis not present

## 2019-07-01 ENCOUNTER — Other Ambulatory Visit: Payer: Self-pay

## 2019-07-01 ENCOUNTER — Emergency Department
Admission: EM | Admit: 2019-07-01 | Discharge: 2019-07-01 | Disposition: A | Discharge status: Home / Self Care | Payer: Medicare Other | Attending: Emergency Medicine | Admitting: Emergency Medicine

## 2019-07-01 DIAGNOSIS — R1110 Vomiting, unspecified: Secondary | ICD-10-CM | POA: Diagnosis not present

## 2019-07-01 DIAGNOSIS — I10 Essential (primary) hypertension: Secondary | ICD-10-CM | POA: Diagnosis not present

## 2019-07-01 DIAGNOSIS — Z7984 Long term (current) use of oral hypoglycemic drugs: Secondary | ICD-10-CM | POA: Diagnosis not present

## 2019-07-01 DIAGNOSIS — Z79899 Other long term (current) drug therapy: Secondary | ICD-10-CM | POA: Insufficient documentation

## 2019-07-01 DIAGNOSIS — F17210 Nicotine dependence, cigarettes, uncomplicated: Secondary | ICD-10-CM | POA: Diagnosis not present

## 2019-07-01 DIAGNOSIS — E119 Type 2 diabetes mellitus without complications: Secondary | ICD-10-CM | POA: Insufficient documentation

## 2019-07-01 DIAGNOSIS — Z8616 Personal history of COVID-19: Secondary | ICD-10-CM | POA: Diagnosis not present

## 2019-07-01 DIAGNOSIS — R197 Diarrhea, unspecified: Secondary | ICD-10-CM | POA: Insufficient documentation

## 2019-07-01 DIAGNOSIS — K529 Noninfective gastroenteritis and colitis, unspecified: Secondary | ICD-10-CM | POA: Diagnosis not present

## 2019-07-01 DIAGNOSIS — R109 Unspecified abdominal pain: Secondary | ICD-10-CM | POA: Diagnosis not present

## 2019-07-01 LAB — COMPREHENSIVE METABOLIC PANEL
ALT: 25 U/L (ref 0–44)
AST: 25 U/L (ref 15–41)
Albumin: 4.6 g/dL (ref 3.5–5.0)
Alkaline Phosphatase: 111 U/L (ref 38–126)
Anion gap: 13 (ref 5–15)
BUN: 8 mg/dL (ref 6–20)
CO2: 22 mmol/L (ref 22–32)
Calcium: 9.6 mg/dL (ref 8.9–10.3)
Chloride: 103 mmol/L (ref 98–111)
Creatinine, Ser: 0.93 mg/dL (ref 0.61–1.24)
GFR calc Af Amer: >60 mL/min (ref >60)
GFR calc non Af Amer: >60 mL/min (ref >60)
Glucose, Bld: 217 mg/dL — ABNORMAL HIGH (ref 70–99)
Potassium: 3.3 mmol/L — ABNORMAL LOW (ref 3.5–5.1)
Sodium: 138 mmol/L (ref 135–145)
Total Bilirubin: 0.7 mg/dL (ref 0.3–1.2)
Total Protein: 7.8 g/dL (ref 6.5–8.1)

## 2019-07-01 LAB — URINALYSIS, COMPLETE (UACMP) WITH MICROSCOPIC
Bacteria, UA: NONE SEEN
Bilirubin Urine: NEGATIVE
Glucose, UA: 50 mg/dL — AB
Hgb urine dipstick: NEGATIVE
Ketones, ur: NEGATIVE mg/dL
Nitrite: NEGATIVE
Protein, ur: 30 mg/dL — AB
Specific Gravity, Urine: 1.025 (ref 1.005–1.030)
pH: 5 (ref 5.0–8.0)

## 2019-07-01 LAB — MAGNESIUM: Magnesium: 1.9 mg/dL (ref 1.7–2.4)

## 2019-07-01 LAB — CBC
HCT: 48.9 % (ref 39.0–52.0)
Hemoglobin: 17 g/dL (ref 13.0–17.0)
MCH: 30.5 pg (ref 26.0–34.0)
MCHC: 34.8 g/dL (ref 30.0–36.0)
MCV: 87.6 fL (ref 80.0–100.0)
Platelets: 318 10*3/uL (ref 150–400)
RBC: 5.58 MIL/uL (ref 4.22–5.81)
RDW: 13.9 % (ref 11.5–15.5)
WBC: 9.3 10*3/uL (ref 4.0–10.5)
nRBC: 0 % (ref 0.0–0.2)

## 2019-07-01 LAB — LIPASE, BLOOD: Lipase: 28 U/L (ref 11–51)

## 2019-07-01 MED ORDER — ONDANSETRON HCL 4 MG/2ML IJ SOLN
INTRAMUSCULAR | Status: AC
  Filled 2019-07-01: qty 2

## 2019-07-01 MED ORDER — LACTATED RINGERS IV BOLUS
1000.0000 mL | Freq: Once | INTRAVENOUS | Status: AC
  Administered 2019-07-01: 1000 mL via INTRAVENOUS

## 2019-07-01 MED ORDER — LOPERAMIDE HCL 2 MG PO CAPS: 2.0000 mg | ORAL_CAPSULE | ORAL | 0 refills | Status: DC | PRN

## 2019-07-01 MED ORDER — ONDANSETRON 4 MG PO TBDP: 4.0000 mg | ORAL_TABLET | Freq: Three times a day (TID) | ORAL | 0 refills | Status: DC | PRN

## 2019-07-01 MED ORDER — MAGNESIUM OXIDE 400 (241.3 MG) MG PO TABS
400.0000 mg | ORAL_TABLET | Freq: Once | ORAL | Status: AC
  Administered 2019-07-01: 400 mg via ORAL
  Filled 2019-07-01: qty 1

## 2019-07-01 MED ORDER — ONDANSETRON HCL 4 MG/2ML IJ SOLN
4.0000 mg | Freq: Once | INTRAMUSCULAR | Status: AC
  Administered 2019-07-01: 4 mg via INTRAVENOUS

## 2019-07-01 MED ORDER — POTASSIUM CHLORIDE CRYS ER 20 MEQ PO TBCR
20.0000 meq | EXTENDED_RELEASE_TABLET | Freq: Once | ORAL | Status: AC
  Administered 2019-07-01: 20 meq via ORAL
  Filled 2019-07-01: qty 1

## 2019-07-01 NOTE — ED Triage Notes (Signed)
Pt arrives to ED c/o abd pain, N&V&D x few days. Pt is diaphoretic. Unsure of fever. A&O, ambulatory. Pt states had COVID in January.

## 2019-07-01 NOTE — ED Provider Notes (Signed)
[facility] Emergency Department Provider Note   ____________________________________________   First MD Initiated Contact with Patient 07/01/19 1546     (approximate)  I have reviewed the triage vital signs and the nursing notes.   HISTORY  Chief Complaint Abdominal Pain, Emesis, and Diarrhea    HPI Michael Schmitt is a 49 y.o. male with past medical history of CAD, ventricular fibrillation, diabetes, chronic benzodiazepine use, and chronic pain presents to the ED for vomiting and diarrhea.  Patient reports that he has had 2 to 3 days of persistent nausea as well as watery diarrhea.  This is been associated with diffuse crampy abdominal pain and he states he has not had much of an appetite.  He has been able to keep down water, but states it is difficult for him to eat.  He denies any fevers, but states he has been feeling sweaty and hot at times.  He denies any recent sick contacts, did test positive for Covid on January 25.  He was sent to the ED by his PCP for further evaluation.        Past Medical History:  Diagnosis Date  . Anxiety   . Anxiety   . Arthritis    cerv. stenosis, spondylosis, HNP- lower back , has been followed in pain clinic, has  had injection s in cerv. area  . Blood dyscrasia    told that when he was young he was a" free bleeder"  . CAD (coronary artery disease)   . Cervical spondylosis without myelopathy 07/24/2014  . Cervicogenic headache 07/24/2014  . Chronic kidney disease    renal calculi- passed spontaneously  . Depression   . Diabetes mellitus without complication ([place])   . Fatty liver   . GERD (gastroesophageal reflux disease)   . Headache(784.0)   . Hyperlipidemia   . Hypertension   . Mental disorder   . MI, old   . RLS (restless legs syndrome)    detected on sleep study  . Shortness of breath   . Ventricular fibrillation ([place]) 04/05/2016    Patient Active Problem List   Diagnosis Date Noted  . Lithium  toxicity   . Hyponatremia 01/24/2019  . Substance induced mood disorder ([place]) 11/22/2018  . Alcohol abuse with alcohol-induced mood disorder ([place]) 09/21/2018  . Overdose 05/17/2018  . Depression, major, recurrent, severe with psychosis ([place]) 03/30/2018  . Personality disorder ([place]) 12/31/2017  . MDD (major depressive disorder), recurrent episode, severe ([employer]) 11/20/2017  . Dysthymia 10/26/2017  . Major depressive disorder, recurrent episode, moderate ([place]) 03/10/2017  . Generalized anxiety disorder 02/05/2017  . Chronic neck pain (Primary Area of Pain) (Left) 11/22/2016  . Cervical facet syndrome (Left) 11/22/2016  . Cervical radiculitis (Left) 11/22/2016  . History of cervical spinal surgery 11/22/2016  . Cervical spondylosis 11/22/2016  . Chronic shoulder pain (Secondary Area of Pain) (Left) 11/22/2016  . Arthropathy of shoulder (Left) 11/22/2016  . Chronic low back pain [facility] Area of Pain) (Bilateral) (L>R) 11/22/2016  . Lumbar facet syndrome (Bilateral) (L>R) 11/22/2016  . Chronic pain syndrome 11/21/2016  . Long term prescription benzodiazepine use 11/21/2016  . Opiate use 11/21/2016  . Type 2 diabetes mellitus without complication, without long-term current use of insulin ([place]) 10/19/2016  . Chronic Suicidal ideation 09/15/2016  . Coronary artery disease 09/12/2016  . HTN (hypertension) 06/26/2016  . GERD (gastroesophageal reflux disease) 06/26/2016  . Alcohol use disorder, severe, 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[place]) 06/26/2016  . Benzodiazepine abuse ([place]) 06/11/2016  .  Sedative, hypnotic or anxiolytic use disorder, severe, dependence ([place]) 05/12/2016  . Ventricular fibrillation ([place]) 04/05/2016  . Combined hyperlipidemia 01/05/2016  . Cervical spondylosis without myelopathy 07/24/2014  . Severe episode of recurrent major depressive disorder, without psychotic features ([place]) 04/15/2013  . Tobacco use disorder 10/20/2010  . Asthma 10/20/2010    Past Surgical History:  Procedure  Laterality Date  . ANTERIOR CERVICAL DECOMP/DISCECTOMY FUSION  11/13/2011   Procedure: ANTERIOR CERVICAL DECOMPRESSION/DISCECTOMY FUSION 1 LEVEL;  Surgeon: [name], MD;  Location: [place] [facility]URO ORS;  Service: Neurosurgery;  Laterality: N/A;  Anterior Cervical Decompression/discectomy Fusion. Cervical three-four.  Marland Kitchen CARDIAC CATHETERIZATION N/A 01/01/2015   Procedure: Left Heart Cath and Coronary Angiography;  Surgeon: [name], MD;  Location: [facility];  Service: Cardiovascular;  Laterality: N/A;  . CARDIAC CATHETERIZATION N/A 04/05/2016   Procedure: Left Heart Cath and Coronary Angiography;  Surgeon: Belva Crome, MD;  Location: [facility];  Service: Cardiovascular;  Laterality: N/A;  . CARDIAC CATHETERIZATION N/A 04/05/2016   Procedure: Coronary Stent Intervention;  Surgeon: Belva Crome, MD;  Location: [facility];  Service: Cardiovascular;  Laterality: N/A;  . CARDIAC CATHETERIZATION N/A 04/05/2016   Procedure: Intravascular Ultrasound/IVUS;  Surgeon: Belva Crome, MD;  Location: [facility];  Service: Cardiovascular;  Laterality: N/A;  . NASAL SINUS SURGERY     2005    Prior to Admission medications   Medication Sig Start Date End Date Taking? Authorizing Provider  ARIPiprazole (ABILIFY) 15 MG tablet Take 15 mg by mouth daily. 10/14/18   [provider]  aspirin 81 MG EC tablet Take 1 tablet (81 mg total) by mouth daily. For heart health Patient not taking: Reported on 01/24/2019 11/21/17   Lindell Spar I, NP  atorvastatin (LIPITOR) 10 MG tablet Take 10 mg by mouth daily. 10/25/18 10/25/19  [provider]  carvedilol (COREG) 12.5 MG tablet Take 12.5 mg by mouth 2 (two) times daily with a meal. 10/25/18 10/25/19  [provider]  DULoxetine (CYMBALTA) 20 MG capsule Take 20 mg by mouth daily. 08/05/18   [provider]  ibuprofen (ADVIL) 600 MG tablet Take 1 tablet (600 mg total) by mouth every 6 (six) hours as needed. 02/02/19    Laban Emperor, PA-C  lamoTRIgine (LAMICTAL) 150 MG tablet Take 150 mg by mouth daily. 07/17/18   [provider]  lidocaine (LIDODERM) 5 % Place 1 patch onto the skin daily. Remove & Discard patch within 12 hours or as directed by MD 02/02/19   Laban Emperor, PA-C  lisinopril (ZESTRIL) 10 MG tablet Take 10 mg by mouth daily. 10/15/18 10/15/19  [provider]  loperamide (IMODIUM) 2 MG capsule Take 1 capsule (2 mg total) by mouth as needed for diarrhea or loose stools. 07/01/19   Blake Divine, MD  metFORMIN (GLUCOPHAGE) 1000 MG tablet Take 1,000 mg by mouth 2 (two) times daily. 10/02/18   [provider]  mirtazapine (REMERON) 30 MG tablet Take 30 mg by mouth Nightly. 10/14/18   [provider]  nicotine (NICODERM CQ - DOSED IN MG/24 HOURS) 21 mg/24hr patch 21 mg chest wall daily (okay to substitute generic) 01/26/19   Loletha Grayer, MD  nitroGLYCERIN (NITROSTAT) 0.4 MG SL tablet Place 0.4 mg under the tongue as needed. 06/03/18   [provider]  ondansetron (ZOFRAN ODT) 4 MG disintegrating tablet Take 1 tablet (4 mg total) by mouth every 8 (eight) hours as needed for nausea or vomiting. 07/01/19  Blake Divine, MD  QUEtiapine (SEROQUEL) 100 MG tablet Take 1 tablet (100 mg total) by mouth at bedtime. 01/26/19   Wieting, Richard, MD  REXULTI 3 MG TABS Take 3 mg by mouth daily. 09/03/18   [provider]  sildenafil (REVATIO) 20 MG tablet Take 1 tablet (20 mg total) by mouth daily as needed. 10/15/18   Billey Co, MD    Allergies Prednisone, Amoxicillin, Hydrocodone, Varenicline, and Trazodone and nefazodone  Family History  Problem Relation Age of Onset  . Prostate cancer Father   . Hypertension Mother   . Kidney Stones Mother   . Anxiety disorder Mother   . Depression Mother   . COPD Sister   . Hypertension Sister   . Diabetes Sister   . Depression Sister   . Anxiety disorder Sister   . Seizures Sister   . ADD / ADHD Son   . ADD /  ADHD Daughter     Social History Social History   Tobacco Use  . Smoking status: Current Every Day Smoker    Packs/day: 2.00    Years: 17.00    Pack years: 34.00    Types: Cigarettes, Cigars  . Smokeless tobacco: Former Systems developer  . Tobacco comment: occassional snuff   Substance Use Topics  . Alcohol use: No    Alcohol/week: 0.0 standard drinks    Comment: last drinked in 3 months.   . Drug use: No    Review of Systems  Constitutional: No fever/chills Eyes: No visual changes. ENT: No sore throat. Cardiovascular: Denies chest pain. Respiratory: Denies shortness of breath. Gastrointestinal: Positive for abdominal pain.  Positive for nausea and vomiting.  Positive for diarrhea.  No constipation. Genitourinary: Negative for dysuria. Musculoskeletal: Negative for back pain. Skin: Negative for rash. Neurological: Negative for headaches, focal weakness or numbness.  ____________________________________________   PHYSICAL EXAM:  VITAL SIGNS: ED Triage Vitals [07/01/19 1458]  Enc Vitals Group     BP (!) 141/107     Pulse Rate 93     Resp 18     Temp 98.4 F (36.9 C)     Temp Source Oral     SpO2 99 %     Weight 195 lb (88.5 kg)     Height 6\' 2"  (1.88 m)     Head Circumference      Peak Flow      Pain Score 6     Pain Loc      Pain Edu?      Excl. in [place]?     Constitutional: Alert and oriented. Eyes: Conjunctivae are normal. Head: Atraumatic. Nose: No congestion/rhinnorhea. Mouth/Throat: Mucous membranes are moist. Neck: Normal ROM Cardiovascular: Normal rate, regular rhythm. Grossly normal heart sounds. Respiratory: Normal respiratory effort.  No retractions. Lungs CTAB. Gastrointestinal: Soft and nontender. No distention. Genitourinary: deferred Musculoskeletal: No lower extremity tenderness nor edema. Neurologic:  Normal speech and language. No gross focal neurologic deficits are appreciated. Skin:  Skin is warm, dry and intact. No rash noted. Psychiatric:  Mood and affect are normal. Speech and behavior are normal.  ____________________________________________   LABS (all labs ordered are listed, but only abnormal results are displayed)  Labs Reviewed  COMPREHENSIVE METABOLIC PANEL - Abnormal; Notable for the following components:      Result Value   Potassium 3.3 (*)    Glucose, Bld 217 (*)    All other components within normal limits  URINALYSIS, COMPLETE (UACMP) WITH MICROSCOPIC - Abnormal; Notable for the following components:  Color, Urine YELLOW (*)    APPearance HAZY (*)    Glucose, UA 50 (*)    Protein, ur 30 (*)    Leukocytes,Ua TRACE (*)    All other components within normal limits  LIPASE, BLOOD  CBC  MAGNESIUM     PROCEDURES  Procedure(s) performed (including Critical Care):  Procedures   ____________________________________________   INITIAL IMPRESSION / ASSESSMENT AND PLAN / ED COURSE       49 year old male with history of CAD, V. fib, diabetes, generalized anxiety disorder, and chronic pain who presents to the ED complaining of a few days of nausea with diarrhea, having difficulty keeping down solids but drinking water without difficulty.  His abdominal exam is reassuring with no focal tenderness and he denies significant pain at this time.  I suspect his symptoms are secondary to a viral gastroenteritis.  It is extremely unlikely he would have redeveloped Covid given he is within 90 days of his positive test in January.  C. difficile also seems unlikely given no recent antibiotic courses and no history of C. difficile.  His lab work is reassuring, no leukocytosis and electrolytes only show mild hypokalemia.  We will replete potassium and check magnesium level.  Also plan to hydrate with IV fluids and treat with IV Zofran.  Patient feeling better here in the ED, able to tolerate electrolyte supplementation without difficulty, no vomiting or diarrhea during his visit here.  Remainder of lab work is reassuring  and I have counseled patient on symptomatic treatment with Zofran and loperamide as needed.  He was counseled to follow-up with his PCP and to return to the ED for new or worsening symptoms.  Patient agrees with plan.      ____________________________________________   FINAL CLINICAL IMPRESSION(S) / ED DIAGNOSES  Final diagnoses:  Gastroenteritis  Vomiting and diarrhea     ED Discharge Orders         Ordered    ondansetron (ZOFRAN ODT) 4 MG disintegrating tablet  Every 8 hours PRN     07/01/19 1726    loperamide (IMODIUM) 2 MG capsule  As needed     07/01/19 1726           Note:  This document was prepared using Dragon voice recognition software and may include unintentional dictation errors.   Blake Divine, MD 07/01/19 [id number]

## 2019-07-10 DIAGNOSIS — E782 Mixed hyperlipidemia: Secondary | ICD-10-CM | POA: Diagnosis not present

## 2019-07-10 DIAGNOSIS — Z720 Tobacco use: Secondary | ICD-10-CM | POA: Diagnosis not present

## 2019-07-10 DIAGNOSIS — L568 Other specified acute skin changes due to ultraviolet radiation: Secondary | ICD-10-CM | POA: Diagnosis not present

## 2019-07-10 DIAGNOSIS — I10 Essential (primary) hypertension: Secondary | ICD-10-CM | POA: Diagnosis not present

## 2019-07-10 DIAGNOSIS — E119 Type 2 diabetes mellitus without complications: Secondary | ICD-10-CM | POA: Diagnosis not present

## 2019-07-10 DIAGNOSIS — L551 Sunburn of second degree: Secondary | ICD-10-CM | POA: Diagnosis not present

## 2019-07-10 DIAGNOSIS — I2510 Atherosclerotic heart disease of native coronary artery without angina pectoris: Secondary | ICD-10-CM | POA: Diagnosis not present

## 2019-07-17 DIAGNOSIS — K219 Gastro-esophageal reflux disease without esophagitis: Secondary | ICD-10-CM | POA: Diagnosis not present

## 2019-07-17 DIAGNOSIS — G4700 Insomnia, unspecified: Secondary | ICD-10-CM | POA: Diagnosis not present

## 2019-07-17 DIAGNOSIS — I252 Old myocardial infarction: Secondary | ICD-10-CM | POA: Diagnosis not present

## 2019-07-17 DIAGNOSIS — I129 Hypertensive chronic kidney disease with stage 1 through stage 4 chronic kidney disease, or unspecified chronic kidney disease: Secondary | ICD-10-CM | POA: Diagnosis not present

## 2019-07-17 DIAGNOSIS — Z79899 Other long term (current) drug therapy: Secondary | ICD-10-CM | POA: Diagnosis not present

## 2019-07-17 DIAGNOSIS — E1122 Type 2 diabetes mellitus with diabetic chronic kidney disease: Secondary | ICD-10-CM | POA: Diagnosis not present

## 2019-07-17 DIAGNOSIS — R1110 Vomiting, unspecified: Secondary | ICD-10-CM | POA: Diagnosis not present

## 2019-07-17 DIAGNOSIS — R197 Diarrhea, unspecified: Secondary | ICD-10-CM | POA: Diagnosis not present

## 2019-07-17 DIAGNOSIS — Z20822 Contact with and (suspected) exposure to covid-19: Secondary | ICD-10-CM | POA: Diagnosis not present

## 2019-07-17 DIAGNOSIS — Z9119 Patient's noncompliance with other medical treatment and regimen: Secondary | ICD-10-CM | POA: Diagnosis not present

## 2019-07-17 DIAGNOSIS — I2510 Atherosclerotic heart disease of native coronary artery without angina pectoris: Secondary | ICD-10-CM | POA: Diagnosis not present

## 2019-07-17 DIAGNOSIS — R112 Nausea with vomiting, unspecified: Secondary | ICD-10-CM | POA: Diagnosis not present

## 2019-07-17 DIAGNOSIS — R6883 Chills (without fever): Secondary | ICD-10-CM | POA: Diagnosis not present

## 2019-07-17 DIAGNOSIS — R109 Unspecified abdominal pain: Secondary | ICD-10-CM | POA: Diagnosis not present

## 2019-07-17 DIAGNOSIS — N189 Chronic kidney disease, unspecified: Secondary | ICD-10-CM | POA: Diagnosis not present

## 2019-07-25 DIAGNOSIS — I10 Essential (primary) hypertension: Secondary | ICD-10-CM | POA: Diagnosis not present

## 2019-07-25 DIAGNOSIS — I259 Chronic ischemic heart disease, unspecified: Secondary | ICD-10-CM | POA: Diagnosis not present

## 2019-07-25 DIAGNOSIS — E119 Type 2 diabetes mellitus without complications: Secondary | ICD-10-CM | POA: Diagnosis not present

## 2019-07-25 DIAGNOSIS — E785 Hyperlipidemia, unspecified: Secondary | ICD-10-CM | POA: Diagnosis not present

## 2019-08-20 DIAGNOSIS — I10 Essential (primary) hypertension: Secondary | ICD-10-CM | POA: Diagnosis not present

## 2019-08-20 DIAGNOSIS — M542 Cervicalgia: Secondary | ICD-10-CM | POA: Diagnosis not present

## 2019-08-20 DIAGNOSIS — R030 Elevated blood-pressure reading, without diagnosis of hypertension: Secondary | ICD-10-CM | POA: Diagnosis not present

## 2019-08-20 DIAGNOSIS — E1165 Type 2 diabetes mellitus with hyperglycemia: Secondary | ICD-10-CM | POA: Diagnosis not present

## 2019-08-28 ENCOUNTER — Ambulatory Visit: Payer: Medicare Other | Admitting: Physical Therapy

## 2019-08-29 ENCOUNTER — Other Ambulatory Visit: Payer: Self-pay | Admitting: Nurse Practitioner

## 2019-08-29 DIAGNOSIS — M542 Cervicalgia: Secondary | ICD-10-CM

## 2019-08-29 DIAGNOSIS — M545 Low back pain, unspecified: Secondary | ICD-10-CM

## 2019-08-29 DIAGNOSIS — M5441 Lumbago with sciatica, right side: Secondary | ICD-10-CM | POA: Diagnosis not present

## 2019-08-29 DIAGNOSIS — G8929 Other chronic pain: Secondary | ICD-10-CM | POA: Diagnosis not present

## 2019-08-29 DIAGNOSIS — M5442 Lumbago with sciatica, left side: Secondary | ICD-10-CM | POA: Diagnosis not present

## 2019-09-05 DIAGNOSIS — I10 Essential (primary) hypertension: Secondary | ICD-10-CM | POA: Diagnosis not present

## 2019-09-06 DIAGNOSIS — K047 Periapical abscess without sinus: Secondary | ICD-10-CM | POA: Diagnosis not present

## 2019-09-13 ENCOUNTER — Ambulatory Visit: Admission: RE | Admit: 2019-09-13 | Payer: Medicare Other | Source: Ambulatory Visit

## 2019-09-13 ENCOUNTER — Ambulatory Visit: Payer: Medicare Other

## 2019-09-23 ENCOUNTER — Ambulatory Visit: Payer: Medicare Other | Admitting: Physical Therapy

## 2019-09-28 DIAGNOSIS — M1990 Unspecified osteoarthritis, unspecified site: Secondary | ICD-10-CM | POA: Diagnosis not present

## 2019-09-28 DIAGNOSIS — E440 Moderate protein-calorie malnutrition: Secondary | ICD-10-CM | POA: Diagnosis not present

## 2019-09-28 DIAGNOSIS — I110 Hypertensive heart disease with heart failure: Secondary | ICD-10-CM | POA: Diagnosis not present

## 2019-09-28 DIAGNOSIS — I10 Essential (primary) hypertension: Secondary | ICD-10-CM | POA: Diagnosis not present

## 2019-09-28 DIAGNOSIS — G8918 Other acute postprocedural pain: Secondary | ICD-10-CM | POA: Diagnosis not present

## 2019-09-28 DIAGNOSIS — Z794 Long term (current) use of insulin: Secondary | ICD-10-CM | POA: Diagnosis not present

## 2019-09-28 DIAGNOSIS — R202 Paresthesia of skin: Secondary | ICD-10-CM | POA: Diagnosis not present

## 2019-09-28 DIAGNOSIS — G5622 Lesion of ulnar nerve, left upper limb: Secondary | ICD-10-CM | POA: Diagnosis not present

## 2019-09-28 DIAGNOSIS — R55 Syncope and collapse: Secondary | ICD-10-CM | POA: Diagnosis not present

## 2019-09-28 DIAGNOSIS — Z7902 Long term (current) use of antithrombotics/antiplatelets: Secondary | ICD-10-CM | POA: Diagnosis not present

## 2019-09-28 DIAGNOSIS — S61401A Unspecified open wound of right hand, initial encounter: Secondary | ICD-10-CM | POA: Diagnosis not present

## 2019-09-28 DIAGNOSIS — I252 Old myocardial infarction: Secondary | ICD-10-CM | POA: Diagnosis not present

## 2019-09-28 DIAGNOSIS — I129 Hypertensive chronic kidney disease with stage 1 through stage 4 chronic kidney disease, or unspecified chronic kidney disease: Secondary | ICD-10-CM | POA: Diagnosis not present

## 2019-09-28 DIAGNOSIS — M87041 Idiopathic aseptic necrosis of right hand: Secondary | ICD-10-CM | POA: Diagnosis not present

## 2019-09-28 DIAGNOSIS — Z20822 Contact with and (suspected) exposure to covid-19: Secondary | ICD-10-CM | POA: Diagnosis not present

## 2019-09-28 DIAGNOSIS — Z955 Presence of coronary angioplasty implant and graft: Secondary | ICD-10-CM | POA: Diagnosis not present

## 2019-09-28 DIAGNOSIS — Z7982 Long term (current) use of aspirin: Secondary | ICD-10-CM | POA: Diagnosis not present

## 2019-09-28 DIAGNOSIS — N189 Chronic kidney disease, unspecified: Secondary | ICD-10-CM | POA: Diagnosis not present

## 2019-09-28 DIAGNOSIS — R569 Unspecified convulsions: Secondary | ICD-10-CM | POA: Diagnosis not present

## 2019-09-28 DIAGNOSIS — G8929 Other chronic pain: Secondary | ICD-10-CM | POA: Diagnosis not present

## 2019-09-28 DIAGNOSIS — Z720 Tobacco use: Secondary | ICD-10-CM | POA: Diagnosis not present

## 2019-09-28 DIAGNOSIS — I499 Cardiac arrhythmia, unspecified: Secondary | ICD-10-CM | POA: Diagnosis not present

## 2019-09-28 DIAGNOSIS — Z880 Allergy status to penicillin: Secondary | ICD-10-CM | POA: Diagnosis not present

## 2019-09-28 DIAGNOSIS — I998 Other disorder of circulatory system: Secondary | ICD-10-CM | POA: Diagnosis not present

## 2019-09-28 DIAGNOSIS — Z981 Arthrodesis status: Secondary | ICD-10-CM | POA: Diagnosis not present

## 2019-09-28 DIAGNOSIS — I96 Gangrene, not elsewhere classified: Secondary | ICD-10-CM | POA: Diagnosis not present

## 2019-09-28 DIAGNOSIS — M79641 Pain in right hand: Secondary | ICD-10-CM | POA: Diagnosis not present

## 2019-09-28 DIAGNOSIS — S51801A Unspecified open wound of right forearm, initial encounter: Secondary | ICD-10-CM | POA: Diagnosis not present

## 2019-09-28 DIAGNOSIS — I7389 Other specified peripheral vascular diseases: Secondary | ICD-10-CM | POA: Diagnosis not present

## 2019-09-28 DIAGNOSIS — Z6823 Body mass index (BMI) 23.0-23.9, adult: Secondary | ICD-10-CM | POA: Diagnosis not present

## 2019-09-28 DIAGNOSIS — I2510 Atherosclerotic heart disease of native coronary artery without angina pectoris: Secondary | ICD-10-CM | POA: Diagnosis not present

## 2019-09-28 DIAGNOSIS — F17210 Nicotine dependence, cigarettes, uncomplicated: Secondary | ICD-10-CM | POA: Diagnosis not present

## 2019-09-28 DIAGNOSIS — S65001A Unspecified injury of ulnar artery at wrist and hand level of right arm, initial encounter: Secondary | ICD-10-CM | POA: Diagnosis not present

## 2019-09-28 DIAGNOSIS — R200 Anesthesia of skin: Secondary | ICD-10-CM | POA: Diagnosis not present

## 2019-09-28 DIAGNOSIS — E1122 Type 2 diabetes mellitus with diabetic chronic kidney disease: Secondary | ICD-10-CM | POA: Diagnosis not present

## 2019-09-28 DIAGNOSIS — I742 Embolism and thrombosis of arteries of the upper extremities: Secondary | ICD-10-CM | POA: Diagnosis not present

## 2019-09-28 DIAGNOSIS — S2249XA Multiple fractures of ribs, unspecified side, initial encounter for closed fracture: Secondary | ICD-10-CM | POA: Diagnosis not present

## 2019-09-28 DIAGNOSIS — R0781 Pleurodynia: Secondary | ICD-10-CM | POA: Diagnosis not present

## 2019-09-28 DIAGNOSIS — I951 Orthostatic hypotension: Secondary | ICD-10-CM | POA: Diagnosis not present

## 2019-09-29 DIAGNOSIS — R569 Unspecified convulsions: Secondary | ICD-10-CM | POA: Diagnosis not present

## 2019-09-29 DIAGNOSIS — R55 Syncope and collapse: Secondary | ICD-10-CM | POA: Insufficient documentation

## 2019-10-10 ENCOUNTER — Other Ambulatory Visit: Payer: Self-pay

## 2019-10-10 ENCOUNTER — Emergency Department
Admission: EM | Admit: 2019-10-10 | Discharge: 2019-10-11 | Disposition: A | Discharge status: Home / Self Care | Payer: Medicare Other | Attending: Emergency Medicine | Admitting: Emergency Medicine

## 2019-10-10 DIAGNOSIS — F17210 Nicotine dependence, cigarettes, uncomplicated: Secondary | ICD-10-CM | POA: Insufficient documentation

## 2019-10-10 DIAGNOSIS — Z7982 Long term (current) use of aspirin: Secondary | ICD-10-CM | POA: Insufficient documentation

## 2019-10-10 DIAGNOSIS — D72829 Elevated white blood cell count, unspecified: Secondary | ICD-10-CM | POA: Diagnosis not present

## 2019-10-10 DIAGNOSIS — R030 Elevated blood-pressure reading, without diagnosis of hypertension: Secondary | ICD-10-CM | POA: Diagnosis not present

## 2019-10-10 DIAGNOSIS — M79641 Pain in right hand: Secondary | ICD-10-CM | POA: Diagnosis present

## 2019-10-10 DIAGNOSIS — Z794 Long term (current) use of insulin: Secondary | ICD-10-CM | POA: Insufficient documentation

## 2019-10-10 DIAGNOSIS — N3000 Acute cystitis without hematuria: Secondary | ICD-10-CM | POA: Diagnosis not present

## 2019-10-10 DIAGNOSIS — R009 Unspecified abnormalities of heart beat: Secondary | ICD-10-CM | POA: Diagnosis not present

## 2019-10-10 DIAGNOSIS — R1084 Generalized abdominal pain: Secondary | ICD-10-CM | POA: Diagnosis not present

## 2019-10-10 DIAGNOSIS — J45909 Unspecified asthma, uncomplicated: Secondary | ICD-10-CM | POA: Insufficient documentation

## 2019-10-10 DIAGNOSIS — Z23 Encounter for immunization: Secondary | ICD-10-CM | POA: Diagnosis not present

## 2019-10-10 DIAGNOSIS — Z79899 Other long term (current) drug therapy: Secondary | ICD-10-CM | POA: Diagnosis not present

## 2019-10-10 DIAGNOSIS — R7989 Other specified abnormal findings of blood chemistry: Secondary | ICD-10-CM

## 2019-10-10 DIAGNOSIS — I70209 Unspecified atherosclerosis of native arteries of extremities, unspecified extremity: Secondary | ICD-10-CM | POA: Diagnosis not present

## 2019-10-10 DIAGNOSIS — E119 Type 2 diabetes mellitus without complications: Secondary | ICD-10-CM | POA: Insufficient documentation

## 2019-10-10 DIAGNOSIS — N189 Chronic kidney disease, unspecified: Secondary | ICD-10-CM | POA: Diagnosis not present

## 2019-10-10 DIAGNOSIS — I2510 Atherosclerotic heart disease of native coronary artery without angina pectoris: Secondary | ICD-10-CM | POA: Insufficient documentation

## 2019-10-10 DIAGNOSIS — I129 Hypertensive chronic kidney disease with stage 1 through stage 4 chronic kidney disease, or unspecified chronic kidney disease: Secondary | ICD-10-CM | POA: Insufficient documentation

## 2019-10-10 DIAGNOSIS — R7402 Elevation of levels of lactic acid dehydrogenase (LDH): Secondary | ICD-10-CM | POA: Diagnosis not present

## 2019-10-10 DIAGNOSIS — E860 Dehydration: Secondary | ICD-10-CM | POA: Diagnosis not present

## 2019-10-10 DIAGNOSIS — I10 Essential (primary) hypertension: Secondary | ICD-10-CM | POA: Diagnosis not present

## 2019-10-10 DIAGNOSIS — I998 Other disorder of circulatory system: Secondary | ICD-10-CM | POA: Diagnosis not present

## 2019-10-10 DIAGNOSIS — M62241 Nontraumatic ischemic infarction of muscle, right hand: Secondary | ICD-10-CM | POA: Diagnosis not present

## 2019-10-10 DIAGNOSIS — R000 Tachycardia, unspecified: Secondary | ICD-10-CM | POA: Diagnosis not present

## 2019-10-10 DIAGNOSIS — R062 Wheezing: Secondary | ICD-10-CM | POA: Diagnosis not present

## 2019-10-10 DIAGNOSIS — A419 Sepsis, unspecified organism: Secondary | ICD-10-CM | POA: Diagnosis not present

## 2019-10-10 HISTORY — DX: Other disorder of circulatory system: I99.8

## 2019-10-10 LAB — COMPREHENSIVE METABOLIC PANEL
ALT: 24 U/L (ref 0–44)
AST: 14 U/L — ABNORMAL LOW (ref 15–41)
Albumin: 3.6 g/dL (ref 3.5–5.0)
Alkaline Phosphatase: 130 U/L — ABNORMAL HIGH (ref 38–126)
Anion gap: 13 (ref 5–15)
BUN: 10 mg/dL (ref 6–20)
CO2: 20 mmol/L — ABNORMAL LOW (ref 22–32)
Calcium: 9 mg/dL (ref 8.9–10.3)
Chloride: 100 mmol/L (ref 98–111)
Creatinine, Ser: 1.06 mg/dL (ref 0.61–1.24)
GFR calc Af Amer: >60 mL/min (ref >60)
GFR calc non Af Amer: >60 mL/min (ref >60)
Glucose, Bld: 191 mg/dL — ABNORMAL HIGH (ref 70–99)
Potassium: 3.6 mmol/L (ref 3.5–5.1)
Sodium: 133 mmol/L — ABNORMAL LOW (ref 135–145)
Total Bilirubin: 0.4 mg/dL (ref 0.3–1.2)
Total Protein: 7.3 g/dL (ref 6.5–8.1)

## 2019-10-10 LAB — CBC WITH DIFFERENTIAL/PLATELET
Abs Immature Granulocytes: 0.11 10*3/uL — ABNORMAL HIGH (ref 0.00–0.07)
Basophils Absolute: 0.1 10*3/uL (ref 0.0–0.1)
Basophils Relative: 0 %
Eosinophils Absolute: 0.2 10*3/uL (ref 0.0–0.5)
Eosinophils Relative: 2 %
HCT: 38 % — ABNORMAL LOW (ref 39.0–52.0)
Hemoglobin: 12.6 g/dL — ABNORMAL LOW (ref 13.0–17.0)
Immature Granulocytes: 1 %
Lymphocytes Relative: 17 %
Lymphs Abs: 2.5 10*3/uL (ref 0.7–4.0)
MCH: 29.4 pg (ref 26.0–34.0)
MCHC: 33.2 g/dL (ref 30.0–36.0)
MCV: 88.6 fL (ref 80.0–100.0)
Monocytes Absolute: 0.7 10*3/uL (ref 0.1–1.0)
Monocytes Relative: 5 %
Neutro Abs: 11.1 10*3/uL — ABNORMAL HIGH (ref 1.7–7.7)
Neutrophils Relative %: 75 %
Platelets: 505 10*3/uL — ABNORMAL HIGH (ref 150–400)
RBC: 4.29 MIL/uL (ref 4.22–5.81)
RDW: 13.9 % (ref 11.5–15.5)
WBC: 14.6 10*3/uL — ABNORMAL HIGH (ref 4.0–10.5)
nRBC: 0 % (ref 0.0–0.2)

## 2019-10-10 LAB — LACTIC ACID, PLASMA: Lactic Acid, Venous: 2.2 mmol/L (ref 0.5–1.9)

## 2019-10-10 LAB — PROTIME-INR
INR: 1 (ref 0.8–1.2)
Prothrombin Time: 12.7 seconds (ref 11.4–15.2)

## 2019-10-10 NOTE — ED Notes (Signed)
Pt states coming in after his PCP told him to come in. Pts right arm noted to be wrapped in gauze. Pts little finger, ring finger and middle finger on the right hand noted to be black in color.

## 2019-10-10 NOTE — ED Triage Notes (Signed)
Patient had blood work done today at PCP. Patient told to come to ED for abnormal labs. Patient does not know which labs were abnormal.  Patient struck a concrete wall 2 weeks ago and damaged right hand/wrist. One of the blood vessels in his right wrist had stent placed Monday to increase blood flow. Patient's 3-5th digits right hand are black distally. Patient was informed that the tips of same fingers would need to be amputated.

## 2019-10-11 ENCOUNTER — Encounter: Payer: Self-pay | Admitting: Emergency Medicine

## 2019-10-11 DIAGNOSIS — M62241 Nontraumatic ischemic infarction of muscle, right hand: Secondary | ICD-10-CM | POA: Diagnosis not present

## 2019-10-11 LAB — LACTIC ACID, PLASMA: Lactic Acid, Venous: 2.1 mmol/L (ref 0.5–1.9)

## 2019-10-11 MED ORDER — SODIUM CHLORIDE 0.9 % IV BOLUS
1000.0000 mL | Freq: Once | INTRAVENOUS | Status: AC
  Administered 2019-10-11: 1000 mL via INTRAVENOUS

## 2019-10-11 NOTE — ED Notes (Signed)
As per ER provider. Dressing placed on right wrist. A petroleum dressing placed, with gauze pad and secured with gauze wrap. Pt states it feels good. Stitches to the area are well approximated.

## 2019-10-11 NOTE — ED Notes (Signed)
Provider cut off dressing to the right arm. As per ER provider, give 1L of NS and repeat LA

## 2019-10-11 NOTE — Discharge Instructions (Signed)
As we discussed, your physical exam is reassuring tonight and I do not believe that you have an acute infection or postoperative complication.  I believe that your abnormal labs are due to your recent surgery and the ongoing process in your arm for which you need to follow-up as scheduled with your plastic surgeon.  I also discussed the case with the plastic surgeon at [facility] and sent a picture of your arm and the on-call plastic surgeon, Dr. Clydene Laming, agreed that it is okay to send you home to follow-up with your plastic surgeon on Tuesday as scheduled.  Continue to take your regular medications and be sure to drink plenty of water, Gatorade, or other clear fluids to stay hydrated.  Return to the emergency department if you develop new or worsening symptoms that concern you.

## 2019-10-11 NOTE — ED Notes (Signed)
Provider aware of Lactic acid of 2.1. Provider states pt is okay to be discharged. And that he will work on the paperwork

## 2019-10-11 NOTE — ED Notes (Signed)
RN talked with pts mother as per pt.

## 2019-10-11 NOTE — ED Notes (Signed)
Pam to [employer] images to [employer].

## 2019-10-11 NOTE — ED Provider Notes (Signed)
[facility] Emergency Department Provider Note  ____________________________________________   First MD Initiated Contact with Patient 10/11/19 0012     (approximate)  I have reviewed the triage vital signs and the nursing notes.   HISTORY  Chief Complaint Abnormal Lab    HPI Michael Schmitt is a 49 y.o. male recently treated at [facility] with surgery for ischemia to his right hand/fingers due to an ulnar artery issue after punching a wall. He presents tonight after going to his primary care doctor for follow-up and being told he had abnormal labs. He said that he feels the same as he has been feeling. He has constant pain in his right hand and arm which is no worse and no better than it was before. His surgery with [facility] was about 5 days ago and he has a follow-up appointment scheduled and 4 days at [facility]. He has been told that he will need to have multiple fingers amputated. He denies fever/chills, sore throat, chest pain, shortness of breath, nausea, vomiting, abdominal pain, and dysuria. He has new numbness nor tingling. He has had no swelling of which she is aware. He has a dressing in place from the surgery and he was told not to remove it until he follows up with them this coming week. He said he feels "weird" and he has had episodes of passing out in the past but he has not passed out recently.         Past Medical History:  Diagnosis Date  . Anxiety   . Anxiety   . Arthritis    cerv. stenosis, spondylosis, HNP- lower back , has been followed in pain clinic, has  had injection s in cerv. area  . Blood dyscrasia    told that when he was young he was a" free bleeder"  . CAD (coronary artery disease)   . Cervical spondylosis without myelopathy 07/24/2014  . Cervicogenic headache 07/24/2014  . Chronic kidney disease    renal calculi- passed spontaneously  . Depression   . Diabetes mellitus without complication ([place])   . Fatty liver   . GERD  (gastroesophageal reflux disease)   . Headache(784.0)   . Hyperlipidemia   . Hypertension   . 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 Mental disorder   . MI, old   . RLS (restless legs syndrome)    detected on sleep study  . Shortness of breath   . Ventricular fibrillation ([place]) 04/05/2016    Patient Active Problem List   Diagnosis Date Noted  . Lithium toxicity   . Hyponatremia 01/24/2019  . Substance induced mood disorder ([place]) 11/22/2018  . Alcohol abuse with alcohol-induced mood disorder ([place]) 09/21/2018  . Overdose 05/17/2018  . Depression, major, recurrent, severe with psychosis ([place]) 03/30/2018  . Personality disorder ([place]) 12/31/2017  . MDD (major depressive disorder), recurrent episode, severe ([place]) 11/20/2017  . Dysthymia 10/26/2017  . Major depressive disorder, recurrent episode, moderate ([place]) 03/10/2017  . Generalized anxiety disorder 02/05/2017  . Chronic neck pain (Primary Area of Pain) (Left) 11/22/2016  . Cervical facet syndrome (Left) 11/22/2016  . Cervical radiculitis (Left) 11/22/2016  . History of cervical spinal surgery 11/22/2016  . Cervical spondylosis 11/22/2016  . Chronic shoulder pain (Secondary Area of Pain) (Left) 11/22/2016  . Arthropathy of shoulder (Left) 11/22/2016  . Chronic low back pain [facility] Area of Pain) (Bilateral) (L>R) 11/22/2016  . Lumbar facet syndrome (Bilateral) (L>R) 11/22/2016  .  Chronic pain syndrome 11/21/2016  . Long term prescription benzodiazepine use 11/21/2016  . Opiate use 11/21/2016  . Type 2 diabetes mellitus without complication, without long-term current use of insulin ([place]) 10/19/2016  . Chronic Suicidal ideation 09/15/2016  . Coronary artery disease 09/12/2016  . HTN (hypertension) 06/26/2016  . GERD (gastroesophageal reflux disease) 06/26/2016  . Alcohol use disorder, severe, 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[place]) 06/26/2016  . Benzodiazepine abuse ([place]) 06/11/2016  . Sedative, hypnotic  or anxiolytic use disorder, severe, dependence ([place]) 05/12/2016  . Ventricular fibrillation ([place]) 04/05/2016  . Combined hyperlipidemia 01/05/2016  . Cervical spondylosis without myelopathy 07/24/2014  . Severe episode of recurrent major depressive disorder, without psychotic features ([place]) 04/15/2013  . Tobacco use disorder 10/20/2010  . Asthma 10/20/2010    Past Surgical History:  Procedure Laterality Date  . ANTERIOR CERVICAL DECOMP/DISCECTOMY FUSION  11/13/2011   Procedure: ANTERIOR CERVICAL DECOMPRESSION/DISCECTOMY FUSION 1 LEVEL;  Surgeon: [name], MD;  Location: [facility];  Service: Neurosurgery;  Laterality: N/A;  Anterior Cervical Decompression/discectomy Fusion. Cervical three-four.  Marland Kitchen CARDIAC CATHETERIZATION N/A 01/01/2015   Procedure: Left Heart Cath and Coronary Angiography;  Surgeon: [name], MD;  Location: [facility];  Service: Cardiovascular;  Laterality: N/A;  . CARDIAC CATHETERIZATION N/A 04/05/2016   Procedure: Left Heart Cath and Coronary Angiography;  Surgeon: Belva Crome, MD;  Location: [facility] LAB;  Service: Cardiovascular;  Laterality: N/A;  . CARDIAC CATHETERIZATION N/A 04/05/2016   Procedure: Coronary Stent Intervention;  Surgeon: Belva Crome, MD;  Location: [facility] CV LAB;  Service: Cardiovascular;  Laterality: N/A;  . CARDIAC CATHETERIZATION N/A 04/05/2016   Procedure: Intravascular Ultrasound/IVUS;  Surgeon: Belva Crome, MD;  Location: [facility] LAB;  Service: Cardiovascular;  Laterality: N/A;  . NASAL SINUS SURGERY     2005    Prior to Admission medications   Medication Sig Start Date End Date Taking? Authorizing Provider  ARIPiprazole (ABILIFY) 15 MG tablet Take 15 mg by mouth daily. 10/14/18   [provider]  aspirin 81 MG EC tablet Take 1 tablet (81 mg total) by mouth daily. For heart health Patient not taking: Reported on 01/24/2019 11/21/17   Lindell Spar I, NP  atorvastatin (LIPITOR) 10 MG tablet Take 10  mg by mouth daily. 10/25/18 10/25/19  [provider]  carvedilol (COREG) 12.5 MG tablet Take 12.5 mg by mouth 2 (two) times daily with a meal. 10/25/18 10/25/19  [provider]  DULoxetine (CYMBALTA) 20 MG capsule Take 20 mg by mouth daily. 08/05/18   [provider]  ibuprofen (ADVIL) 600 MG tablet Take 1 tablet (600 mg total) by mouth every 6 (six) hours as needed. 02/02/19   Laban Emperor, PA-C  lamoTRIgine (LAMICTAL) 150 MG tablet Take 150 mg by mouth daily. 07/17/18   [provider]  lidocaine (LIDODERM) 5 % Place 1 patch onto the skin daily. Remove & Discard patch within 12 hours or as directed by MD 02/02/19   Laban Emperor, PA-C  lisinopril (ZESTRIL) 10 MG tablet Take 10 mg by mouth daily. 10/15/18 10/15/19  [provider]  loperamide (IMODIUM) 2 MG capsule Take 1 capsule (2 mg total) by mouth as needed for diarrhea or loose stools. 07/01/19   Blake Divine, MD  metFORMIN (GLUCOPHAGE) 1000 MG tablet Take 1,000 mg by mouth 2 (two) times daily. 10/02/18   [provider]  mirtazapine (REMERON) 30 MG tablet Take 30 mg by mouth Nightly. 10/14/18   [provider]  nicotine (NICODERM CQ - DOSED IN MG/24 HOURS) 21 mg/24hr patch 21 mg chest wall daily (okay to substitute generic) 01/26/19   Leslye Peer, Richard, MD  nitroGLYCERIN (NITROSTAT) 0.4 MG SL tablet Place 0.4 mg under the tongue as needed. 06/03/18   [provider]  ondansetron (ZOFRAN ODT) 4 MG disintegrating tablet Take 1 tablet (4 mg total) by mouth every 8 (eight) hours as needed for nausea or vomiting. 07/01/19   Blake Divine, MD  QUEtiapine (SEROQUEL) 100 MG tablet Take 1 tablet (100 mg total) by mouth at bedtime. 01/26/19   Wieting, Richard, MD  REXULTI 3 MG TABS Take 3 mg by mouth daily. 09/03/18   [provider]  sildenafil (REVATIO) 20 MG tablet Take 1 tablet (20 mg total) by mouth daily as needed. 10/15/18   Billey Co, MD    Allergies Prednisone,  Amoxicillin, Hydrocodone, Varenicline, and Trazodone and nefazodone  Family History  Problem Relation Age of Onset  . Prostate cancer Father   . Hypertension Mother   . Kidney Stones Mother   . Anxiety disorder Mother   . Depression Mother   . COPD Sister   . Hypertension Sister   . Diabetes Sister   . Depression Sister   . Anxiety disorder Sister   . Seizures Sister   . ADD / ADHD Son   . ADD / ADHD Daughter     Social History Social History   Tobacco Use  . Smoking status: Current Every Day Smoker    Packs/day: 2.00    Years: 17.00    Pack years: 34.00    Types: Cigarettes, Cigars  . Smokeless tobacco: Former Systems developer  . Tobacco comment: occassional snuff   Vaping Use  . Vaping Use: Never used  Substance Use Topics  . Alcohol use: No    Alcohol/week: 0.0 standard drinks    Comment: last drinked in 3 months.   . Drug use: No    Review of Systems Constitutional: No fever/chills Eyes: No visual changes. ENT: No sore throat. Cardiovascular: Denies chest pain. Respiratory: Denies shortness of breath. Gastrointestinal: No abdominal pain.  No nausea, no vomiting.  No diarrhea.  No constipation. Genitourinary: Negative for dysuria. Musculoskeletal: Chronic pain in right hand and arm. Integumentary: Ischemia and necrosis to multiple fingers of the right hand. Neurological: Negative for headaches, focal weakness or numbness.   ____________________________________________   PHYSICAL EXAM:  VITAL SIGNS: ED Triage Vitals  Enc Vitals Group     BP 10/10/19 2236 133/90     Pulse Rate 10/10/19 2236 (!) 103     Resp 10/10/19 2236 18     Temp 10/10/19 2236 97.8 F (36.6 C)     Temp Source 10/10/19 2236 Oral     SpO2 10/10/19 2236 97 %     Weight 10/10/19 2237 88.5 kg (195 lb 1.7 oz)     Height 10/10/19 2237 1.88 m (6\' 2" )     Head Circumference --      Peak Flow --      Pain Score 10/10/19 2238 8     Pain Loc --      Pain Edu? --      Excl. in [place]? --      Constitutional: Alert and oriented.  Eyes: Conjunctivae are normal.  Head: Atraumatic. Nose: No congestion/rhinnorhea. Mouth/Throat: Patient is wearing a mask. Neck: No stridor.  No meningeal signs.   Cardiovascular: Mild tachycardia initially in triage, now resolved, regular rhythm. Good peripheral circulation. Grossly normal heart sounds.  Respiratory: Normal respiratory effort.  No retractions. Gastrointestinal: Soft and nontender. No distention.  Neurologic:  Normal speech and language. No gross focal neurologic deficits are appreciated other than the limited function of his right hand as demonstrated below. Skin:  Skin is warm, dry and intact except for the right hand and forearm. See musculoskeletal exam below. Psychiatric: Mood and affect are normal. Speech and behavior are normal. Musculoskeletal: Necrosis to the 3rd, 4th, and 5th fingers of the right hand. Patient still has some mobility.  After removal of the postoperative dressing, the arm itself appears generally well with no evidence of wound infection nor wound dehiscence.  Compartments are soft and easily compressible.  Minimal tendernes to palpation.     ____________________________________________   LABS (all labs ordered are listed, but only abnormal results are displayed)  Labs Reviewed  COMPREHENSIVE METABOLIC PANEL - Abnormal; Notable for the following components:      Result Value   Sodium 133 (*)    CO2 20 (*)    Glucose, Bld 191 (*)    AST 14 (*)    Alkaline Phosphatase 130 (*)    All other components within normal limits  LACTIC ACID, PLASMA - Abnormal; Notable for the following components:   Lactic Acid, Venous 2.2 (*)    All other components within normal limits  LACTIC ACID, PLASMA - Abnormal; Notable for the following components:   Lactic Acid, Venous 2.1 (*)    All other components within normal limits  CBC WITH DIFFERENTIAL/PLATELET - Abnormal; Notable for the following components:   WBC 14.6  (*)    Hemoglobin 12.6 (*)    HCT 38.0 (*)    Platelets 505 (*)    Neutro Abs 11.1 (*)    Abs Immature Granulocytes 0.11 (*)    All other components within normal limits  CULTURE, BLOOD (ROUTINE X 2)  CULTURE, BLOOD (ROUTINE X 2)  PROTIME-INR   ____________________________________________  EKG  None - EKG not ordered by ED physician ____________________________________________  RADIOLOGY Ursula Alert, personally viewed and evaluated these images (plain radiographs) as part of my medical decision making, as well as reviewing the written report by the radiologist.  ED MD interpretation: Indication for emergent imaging  Official radiology report(s): No results found.  ____________________________________________   PROCEDURES   Procedure(s) performed (including Critical Care):  Procedures   ____________________________________________   INITIAL IMPRESSION / MDM / [place] / ED COURSE  As part of my medical decision making, I reviewed the following data within the [place] notes reviewed and incorporated, Labs reviewed , Old chart reviewed, Discussed with [facility] plastic surgery (Dr. Clydene Laming) and Notes from prior ED visits   Differential diagnosis includes, but is not limited to, postoperative wound infection, worsening necrosis, other unrelated infection, sepsis.  This is a difficult clinical picture because technically speaking the patient meets sepsis criteria.  However he has no worsening symptoms from before, has been stable since his surgery, and has recently had surgery as well as has an ongoing vascular issue with necrotic fingers which could explain his leukocytosis.  I reviewed his medical record and as an outpatient he had a white blood cell count of greater than 20 but for [place] it is about 14.  His labs are otherwise reassuring except for a slightly elevated lactic acid of 2.2 but the patient has not been eating or drinking very  well since his surgery.  I provided 1 L normal saline.  I removed the  dressing on his arm and his wound is well-appearing, no evidence of compartment syndrome, no evidence of cellulitis or wound infection.  I will contact [facility] to discuss whether or not he would benefit from a transfer but at this point I do not think he needs antibiotics or transfer.  Blood cultures are pending.  I discussed all this with the patient and he agrees that he does not want to go to [facility] if it is not necessary to do so.       Clinical Course as of Oct 11 454  Sat Oct 11, 2019  [place] ED secretary is contacting [facility] so I can speak with physician covering for Dr. Drema Balzarine with Plastic Surgery.   [CF]  [phone] I discussed the case by phone with Dr. Clydene Laming with [facility] plastic surgery.  I also sent her a picture of the patient's wound.  Based on the clinical picture and the lateral photo provided to the plastic surgeon, she agrees that the patient does not need transfer to [facility] at this time and that his leukocytosis is likely reactive to his recent surgery and the ongoing process in his arm but not necessarily indicative of acute infection.  The patient's lactic acid came down slightly after a liter of fluids but most importantly did not increase.  Again, he has no worsening pain than baseline and a reassuring physical exam.  I will discharge the patient for close outpatient follow-up with his plastic surgeon as scheduled.  I gave my usual and customary return precautions.   [CF]    Clinical Course User Index [CF] Hinda Kehr, MD     ____________________________________________  FINAL CLINICAL IMPRESSION(S) / ED DIAGNOSES  Final diagnoses:  Limb ischemia  Leukocytosis, unspecified type  Elevated lactic acid level     MEDICATIONS GIVEN DURING THIS VISIT:  Medications  sodium chloride 0.9 % bolus 1,000 mL (0 mLs Intravenous Stopped 10/11/19 0215)     ED Discharge Orders    None      *Please note:  Michael Schmitt  was evaluated in Emergency Department on 10/11/2019 for the symptoms described in the history of present illness. He was evaluated in the context of the global COVID-19 pandemic, which necessitated consideration that the patient might be at risk for infection with the SARS-CoV-2 virus that causes COVID-19. Institutional protocols and algorithms that pertain to the evaluation of patients at risk for COVID-19 are in a state of rapid change based on information released by regulatory bodies including the CDC and federal and state organizations. These policies and algorithms were followed during the patient's care in the ED.  Some ED evaluations and interventions may be delayed as a result of limited staffing during and after the pandemic.*  Note:  This document was prepared using Dragon voice recognition software and may include unintentional dictation errors.   Hinda Kehr, MD 10/11/19 [phone]

## 2019-10-12 DIAGNOSIS — F17220 Nicotine dependence, chewing tobacco, uncomplicated: Secondary | ICD-10-CM | POA: Diagnosis not present

## 2019-10-12 DIAGNOSIS — R197 Diarrhea, unspecified: Secondary | ICD-10-CM | POA: Diagnosis not present

## 2019-10-12 DIAGNOSIS — E1165 Type 2 diabetes mellitus with hyperglycemia: Secondary | ICD-10-CM | POA: Diagnosis not present

## 2019-10-12 DIAGNOSIS — E119 Type 2 diabetes mellitus without complications: Secondary | ICD-10-CM | POA: Diagnosis not present

## 2019-10-12 DIAGNOSIS — I9789 Other postprocedural complications and disorders of the circulatory system, not elsewhere classified: Secondary | ICD-10-CM | POA: Diagnosis not present

## 2019-10-12 DIAGNOSIS — Z992 Dependence on renal dialysis: Secondary | ICD-10-CM | POA: Diagnosis not present

## 2019-10-12 DIAGNOSIS — N3000 Acute cystitis without hematuria: Secondary | ICD-10-CM | POA: Diagnosis not present

## 2019-10-12 DIAGNOSIS — I081 Rheumatic disorders of both mitral and tricuspid valves: Secondary | ICD-10-CM | POA: Diagnosis not present

## 2019-10-12 DIAGNOSIS — T86828 Other complications of skin graft (allograft) (autograft): Secondary | ICD-10-CM | POA: Diagnosis not present

## 2019-10-12 DIAGNOSIS — M87044 Idiopathic aseptic necrosis of right finger(s): Secondary | ICD-10-CM | POA: Diagnosis not present

## 2019-10-12 DIAGNOSIS — N390 Urinary tract infection, site not specified: Secondary | ICD-10-CM | POA: Diagnosis not present

## 2019-10-12 DIAGNOSIS — R1084 Generalized abdominal pain: Secondary | ICD-10-CM | POA: Diagnosis not present

## 2019-10-12 DIAGNOSIS — E1122 Type 2 diabetes mellitus with diabetic chronic kidney disease: Secondary | ICD-10-CM | POA: Diagnosis not present

## 2019-10-12 DIAGNOSIS — I998 Other disorder of circulatory system: Secondary | ICD-10-CM | POA: Diagnosis not present

## 2019-10-12 DIAGNOSIS — G8918 Other acute postprocedural pain: Secondary | ICD-10-CM | POA: Diagnosis not present

## 2019-10-12 DIAGNOSIS — S51801A Unspecified open wound of right forearm, initial encounter: Secondary | ICD-10-CM | POA: Diagnosis not present

## 2019-10-12 DIAGNOSIS — T86821 Skin graft (allograft) (autograft) failure: Secondary | ICD-10-CM | POA: Diagnosis not present

## 2019-10-12 DIAGNOSIS — B9620 Unspecified Escherichia coli [E. coli] as the cause of diseases classified elsewhere: Secondary | ICD-10-CM | POA: Diagnosis not present

## 2019-10-12 DIAGNOSIS — E440 Moderate protein-calorie malnutrition: Secondary | ICD-10-CM | POA: Diagnosis not present

## 2019-10-12 DIAGNOSIS — A419 Sepsis, unspecified organism: Secondary | ICD-10-CM | POA: Diagnosis not present

## 2019-10-12 DIAGNOSIS — M1990 Unspecified osteoarthritis, unspecified site: Secondary | ICD-10-CM | POA: Diagnosis not present

## 2019-10-12 DIAGNOSIS — B9561 Methicillin susceptible Staphylococcus aureus infection as the cause of diseases classified elsewhere: Secondary | ICD-10-CM | POA: Diagnosis not present

## 2019-10-12 DIAGNOSIS — N189 Chronic kidney disease, unspecified: Secondary | ICD-10-CM | POA: Diagnosis not present

## 2019-10-12 DIAGNOSIS — I2510 Atherosclerotic heart disease of native coronary artery without angina pectoris: Secondary | ICD-10-CM | POA: Diagnosis not present

## 2019-10-12 DIAGNOSIS — R05 Cough: Secondary | ICD-10-CM | POA: Diagnosis not present

## 2019-10-12 DIAGNOSIS — R0781 Pleurodynia: Secondary | ICD-10-CM | POA: Diagnosis not present

## 2019-10-12 DIAGNOSIS — R109 Unspecified abdominal pain: Secondary | ICD-10-CM | POA: Diagnosis not present

## 2019-10-12 DIAGNOSIS — R7881 Bacteremia: Secondary | ICD-10-CM | POA: Diagnosis not present

## 2019-10-12 DIAGNOSIS — R7989 Other specified abnormal findings of blood chemistry: Secondary | ICD-10-CM | POA: Diagnosis not present

## 2019-10-12 DIAGNOSIS — E872 Acidosis: Secondary | ICD-10-CM | POA: Diagnosis not present

## 2019-10-12 DIAGNOSIS — R638 Other symptoms and signs concerning food and fluid intake: Secondary | ICD-10-CM | POA: Diagnosis not present

## 2019-10-12 DIAGNOSIS — I129 Hypertensive chronic kidney disease with stage 1 through stage 4 chronic kidney disease, or unspecified chronic kidney disease: Secondary | ICD-10-CM | POA: Diagnosis not present

## 2019-10-12 DIAGNOSIS — I96 Gangrene, not elsewhere classified: Secondary | ICD-10-CM | POA: Diagnosis not present

## 2019-10-12 DIAGNOSIS — E860 Dehydration: Secondary | ICD-10-CM | POA: Diagnosis not present

## 2019-10-12 DIAGNOSIS — M799 Soft tissue disorder, unspecified: Secondary | ICD-10-CM | POA: Diagnosis not present

## 2019-10-12 DIAGNOSIS — I10 Essential (primary) hypertension: Secondary | ICD-10-CM | POA: Diagnosis not present

## 2019-10-13 DIAGNOSIS — I10 Essential (primary) hypertension: Secondary | ICD-10-CM | POA: Diagnosis not present

## 2019-10-13 DIAGNOSIS — R109 Unspecified abdominal pain: Secondary | ICD-10-CM | POA: Diagnosis not present

## 2019-10-13 DIAGNOSIS — R7989 Other specified abnormal findings of blood chemistry: Secondary | ICD-10-CM | POA: Diagnosis not present

## 2019-10-13 DIAGNOSIS — R638 Other symptoms and signs concerning food and fluid intake: Secondary | ICD-10-CM | POA: Diagnosis not present

## 2019-10-14 DIAGNOSIS — I081 Rheumatic disorders of both mitral and tricuspid valves: Secondary | ICD-10-CM | POA: Diagnosis not present

## 2019-10-14 DIAGNOSIS — B9561 Methicillin susceptible Staphylococcus aureus infection as the cause of diseases classified elsewhere: Secondary | ICD-10-CM | POA: Diagnosis not present

## 2019-10-14 DIAGNOSIS — I10 Essential (primary) hypertension: Secondary | ICD-10-CM | POA: Diagnosis not present

## 2019-10-14 DIAGNOSIS — N390 Urinary tract infection, site not specified: Secondary | ICD-10-CM | POA: Diagnosis not present

## 2019-10-14 DIAGNOSIS — R7881 Bacteremia: Secondary | ICD-10-CM | POA: Diagnosis not present

## 2019-10-15 DIAGNOSIS — B9561 Methicillin susceptible Staphylococcus aureus infection as the cause of diseases classified elsewhere: Secondary | ICD-10-CM | POA: Diagnosis not present

## 2019-10-15 DIAGNOSIS — I96 Gangrene, not elsewhere classified: Secondary | ICD-10-CM | POA: Diagnosis not present

## 2019-10-15 DIAGNOSIS — N390 Urinary tract infection, site not specified: Secondary | ICD-10-CM | POA: Diagnosis not present

## 2019-10-15 DIAGNOSIS — I998 Other disorder of circulatory system: Secondary | ICD-10-CM | POA: Diagnosis not present

## 2019-10-15 DIAGNOSIS — I10 Essential (primary) hypertension: Secondary | ICD-10-CM | POA: Diagnosis not present

## 2019-10-15 DIAGNOSIS — G8918 Other acute postprocedural pain: Secondary | ICD-10-CM | POA: Diagnosis not present

## 2019-10-15 DIAGNOSIS — S51801A Unspecified open wound of right forearm, initial encounter: Secondary | ICD-10-CM | POA: Diagnosis not present

## 2019-10-15 DIAGNOSIS — R7881 Bacteremia: Secondary | ICD-10-CM | POA: Diagnosis not present

## 2019-10-15 DIAGNOSIS — B9620 Unspecified Escherichia coli [E. coli] as the cause of diseases classified elsewhere: Secondary | ICD-10-CM | POA: Diagnosis not present

## 2019-10-16 DIAGNOSIS — R7881 Bacteremia: Secondary | ICD-10-CM | POA: Diagnosis not present

## 2019-10-16 DIAGNOSIS — N390 Urinary tract infection, site not specified: Secondary | ICD-10-CM | POA: Diagnosis not present

## 2019-10-16 DIAGNOSIS — I10 Essential (primary) hypertension: Secondary | ICD-10-CM | POA: Diagnosis not present

## 2019-10-16 DIAGNOSIS — B9561 Methicillin susceptible Staphylococcus aureus infection as the cause of diseases classified elsewhere: Secondary | ICD-10-CM | POA: Diagnosis not present

## 2019-10-16 LAB — CULTURE, BLOOD (ROUTINE X 2)
Culture: NO GROWTH
Culture: NO GROWTH
Special Requests: ADEQUATE

## 2019-10-17 DIAGNOSIS — R7881 Bacteremia: Secondary | ICD-10-CM | POA: Diagnosis not present

## 2019-10-17 DIAGNOSIS — B9561 Methicillin susceptible Staphylococcus aureus infection as the cause of diseases classified elsewhere: Secondary | ICD-10-CM | POA: Diagnosis not present

## 2019-10-18 DIAGNOSIS — B9561 Methicillin susceptible Staphylococcus aureus infection as the cause of diseases classified elsewhere: Secondary | ICD-10-CM | POA: Diagnosis not present

## 2019-10-18 DIAGNOSIS — R7881 Bacteremia: Secondary | ICD-10-CM | POA: Diagnosis not present

## 2019-10-19 DIAGNOSIS — I10 Essential (primary) hypertension: Secondary | ICD-10-CM | POA: Diagnosis not present

## 2019-10-19 DIAGNOSIS — R7881 Bacteremia: Secondary | ICD-10-CM | POA: Diagnosis not present

## 2019-10-19 DIAGNOSIS — B9561 Methicillin susceptible Staphylococcus aureus infection as the cause of diseases classified elsewhere: Secondary | ICD-10-CM | POA: Diagnosis not present

## 2019-10-19 DIAGNOSIS — N390 Urinary tract infection, site not specified: Secondary | ICD-10-CM | POA: Diagnosis not present

## 2019-10-19 DIAGNOSIS — B9620 Unspecified Escherichia coli [E. coli] as the cause of diseases classified elsewhere: Secondary | ICD-10-CM | POA: Diagnosis not present

## 2019-10-20 DIAGNOSIS — N390 Urinary tract infection, site not specified: Secondary | ICD-10-CM | POA: Diagnosis not present

## 2019-10-20 DIAGNOSIS — B9561 Methicillin susceptible Staphylococcus aureus infection as the cause of diseases classified elsewhere: Secondary | ICD-10-CM | POA: Diagnosis not present

## 2019-10-20 DIAGNOSIS — R7881 Bacteremia: Secondary | ICD-10-CM | POA: Diagnosis not present

## 2019-10-20 DIAGNOSIS — I10 Essential (primary) hypertension: Secondary | ICD-10-CM | POA: Diagnosis not present

## 2019-10-21 DIAGNOSIS — N390 Urinary tract infection, site not specified: Secondary | ICD-10-CM | POA: Diagnosis not present

## 2019-10-21 DIAGNOSIS — B9561 Methicillin susceptible Staphylococcus aureus infection as the cause of diseases classified elsewhere: Secondary | ICD-10-CM | POA: Diagnosis not present

## 2019-10-21 DIAGNOSIS — R7881 Bacteremia: Secondary | ICD-10-CM | POA: Diagnosis not present

## 2019-10-21 DIAGNOSIS — I10 Essential (primary) hypertension: Secondary | ICD-10-CM | POA: Diagnosis not present

## 2019-10-22 DIAGNOSIS — R7881 Bacteremia: Secondary | ICD-10-CM | POA: Diagnosis not present

## 2019-10-22 DIAGNOSIS — I10 Essential (primary) hypertension: Secondary | ICD-10-CM | POA: Diagnosis not present

## 2019-10-22 DIAGNOSIS — R0781 Pleurodynia: Secondary | ICD-10-CM | POA: Diagnosis not present

## 2019-10-22 DIAGNOSIS — B9561 Methicillin susceptible Staphylococcus aureus infection as the cause of diseases classified elsewhere: Secondary | ICD-10-CM | POA: Diagnosis not present

## 2019-10-23 DIAGNOSIS — I998 Other disorder of circulatory system: Secondary | ICD-10-CM | POA: Diagnosis not present

## 2019-10-23 DIAGNOSIS — R7881 Bacteremia: Secondary | ICD-10-CM | POA: Diagnosis not present

## 2019-10-23 DIAGNOSIS — B9561 Methicillin susceptible Staphylococcus aureus infection as the cause of diseases classified elsewhere: Secondary | ICD-10-CM | POA: Diagnosis not present

## 2019-10-24 DIAGNOSIS — R0781 Pleurodynia: Secondary | ICD-10-CM | POA: Diagnosis not present

## 2019-10-24 DIAGNOSIS — I998 Other disorder of circulatory system: Secondary | ICD-10-CM | POA: Diagnosis not present

## 2019-10-24 DIAGNOSIS — B9561 Methicillin susceptible Staphylococcus aureus infection as the cause of diseases classified elsewhere: Secondary | ICD-10-CM | POA: Diagnosis not present

## 2019-10-24 DIAGNOSIS — R7881 Bacteremia: Secondary | ICD-10-CM | POA: Diagnosis not present

## 2019-10-24 DIAGNOSIS — E1165 Type 2 diabetes mellitus with hyperglycemia: Secondary | ICD-10-CM | POA: Diagnosis not present

## 2019-10-25 DIAGNOSIS — I10 Essential (primary) hypertension: Secondary | ICD-10-CM | POA: Diagnosis not present

## 2019-10-25 DIAGNOSIS — R7881 Bacteremia: Secondary | ICD-10-CM | POA: Diagnosis not present

## 2019-10-25 DIAGNOSIS — E119 Type 2 diabetes mellitus without complications: Secondary | ICD-10-CM | POA: Diagnosis not present

## 2019-10-25 DIAGNOSIS — I2510 Atherosclerotic heart disease of native coronary artery without angina pectoris: Secondary | ICD-10-CM | POA: Diagnosis not present

## 2019-10-25 DIAGNOSIS — B9561 Methicillin susceptible Staphylococcus aureus infection as the cause of diseases classified elsewhere: Secondary | ICD-10-CM | POA: Diagnosis not present

## 2019-10-26 DIAGNOSIS — I2510 Atherosclerotic heart disease of native coronary artery without angina pectoris: Secondary | ICD-10-CM | POA: Diagnosis not present

## 2019-10-26 DIAGNOSIS — B9561 Methicillin susceptible Staphylococcus aureus infection as the cause of diseases classified elsewhere: Secondary | ICD-10-CM | POA: Diagnosis not present

## 2019-10-26 DIAGNOSIS — R7881 Bacteremia: Secondary | ICD-10-CM | POA: Diagnosis not present

## 2019-10-27 DIAGNOSIS — B9561 Methicillin susceptible Staphylococcus aureus infection as the cause of diseases classified elsewhere: Secondary | ICD-10-CM | POA: Diagnosis not present

## 2019-10-27 DIAGNOSIS — R7881 Bacteremia: Secondary | ICD-10-CM | POA: Diagnosis not present

## 2019-10-27 DIAGNOSIS — I2510 Atherosclerotic heart disease of native coronary artery without angina pectoris: Secondary | ICD-10-CM | POA: Diagnosis not present

## 2019-10-28 DIAGNOSIS — R7881 Bacteremia: Secondary | ICD-10-CM | POA: Diagnosis not present

## 2019-10-28 DIAGNOSIS — I10 Essential (primary) hypertension: Secondary | ICD-10-CM | POA: Diagnosis not present

## 2019-10-28 DIAGNOSIS — I2510 Atherosclerotic heart disease of native coronary artery without angina pectoris: Secondary | ICD-10-CM | POA: Diagnosis not present

## 2019-10-28 DIAGNOSIS — B9561 Methicillin susceptible Staphylococcus aureus infection as the cause of diseases classified elsewhere: Secondary | ICD-10-CM | POA: Diagnosis not present

## 2019-10-29 DIAGNOSIS — B9561 Methicillin susceptible Staphylococcus aureus infection as the cause of diseases classified elsewhere: Secondary | ICD-10-CM | POA: Diagnosis not present

## 2019-10-29 DIAGNOSIS — R7881 Bacteremia: Secondary | ICD-10-CM | POA: Diagnosis not present

## 2019-10-30 DIAGNOSIS — R7881 Bacteremia: Secondary | ICD-10-CM | POA: Diagnosis not present

## 2019-10-30 DIAGNOSIS — B9561 Methicillin susceptible Staphylococcus aureus infection as the cause of diseases classified elsewhere: Secondary | ICD-10-CM | POA: Diagnosis not present

## 2019-10-31 DIAGNOSIS — R7881 Bacteremia: Secondary | ICD-10-CM | POA: Diagnosis not present

## 2019-10-31 DIAGNOSIS — B9561 Methicillin susceptible Staphylococcus aureus infection as the cause of diseases classified elsewhere: Secondary | ICD-10-CM | POA: Diagnosis not present

## 2019-11-01 DIAGNOSIS — B9561 Methicillin susceptible Staphylococcus aureus infection as the cause of diseases classified elsewhere: Secondary | ICD-10-CM | POA: Diagnosis not present

## 2019-11-01 DIAGNOSIS — R7881 Bacteremia: Secondary | ICD-10-CM | POA: Diagnosis not present

## 2019-11-02 DIAGNOSIS — N3000 Acute cystitis without hematuria: Secondary | ICD-10-CM | POA: Diagnosis not present

## 2019-11-02 DIAGNOSIS — R7881 Bacteremia: Secondary | ICD-10-CM | POA: Diagnosis not present

## 2019-11-02 DIAGNOSIS — B9561 Methicillin susceptible Staphylococcus aureus infection as the cause of diseases classified elsewhere: Secondary | ICD-10-CM | POA: Diagnosis not present

## 2019-11-03 DIAGNOSIS — R7881 Bacteremia: Secondary | ICD-10-CM | POA: Diagnosis not present

## 2019-11-03 DIAGNOSIS — B9561 Methicillin susceptible Staphylococcus aureus infection as the cause of diseases classified elsewhere: Secondary | ICD-10-CM | POA: Diagnosis not present

## 2019-11-04 DIAGNOSIS — R7881 Bacteremia: Secondary | ICD-10-CM | POA: Diagnosis not present

## 2019-11-04 DIAGNOSIS — B9561 Methicillin susceptible Staphylococcus aureus infection as the cause of diseases classified elsewhere: Secondary | ICD-10-CM | POA: Diagnosis not present

## 2019-11-05 DIAGNOSIS — B9561 Methicillin susceptible Staphylococcus aureus infection as the cause of diseases classified elsewhere: Secondary | ICD-10-CM | POA: Diagnosis not present

## 2019-11-05 DIAGNOSIS — R7881 Bacteremia: Secondary | ICD-10-CM | POA: Diagnosis not present

## 2019-11-06 DIAGNOSIS — B9561 Methicillin susceptible Staphylococcus aureus infection as the cause of diseases classified elsewhere: Secondary | ICD-10-CM | POA: Diagnosis not present

## 2019-11-06 DIAGNOSIS — R7881 Bacteremia: Secondary | ICD-10-CM | POA: Diagnosis not present

## 2019-11-17 DIAGNOSIS — Z09 Encounter for follow-up examination after completed treatment for conditions other than malignant neoplasm: Secondary | ICD-10-CM | POA: Diagnosis not present

## 2019-11-20 ENCOUNTER — Other Ambulatory Visit: Payer: Self-pay

## 2019-11-20 ENCOUNTER — Ambulatory Visit: Payer: Medicare Other | Attending: Plastic and Reconstructive Surgery | Admitting: Occupational Therapy

## 2019-11-20 ENCOUNTER — Encounter: Payer: Self-pay | Admitting: Occupational Therapy

## 2019-11-20 DIAGNOSIS — L905 Scar conditions and fibrosis of skin: Secondary | ICD-10-CM | POA: Diagnosis not present

## 2019-11-20 DIAGNOSIS — R209 Unspecified disturbances of skin sensation: Secondary | ICD-10-CM | POA: Diagnosis not present

## 2019-11-20 DIAGNOSIS — M25641 Stiffness of right hand, not elsewhere classified: Secondary | ICD-10-CM

## 2019-11-20 DIAGNOSIS — S68119D Complete traumatic metacarpophalangeal amputation of unspecified finger, subsequent encounter: Secondary | ICD-10-CM | POA: Diagnosis not present

## 2019-11-20 DIAGNOSIS — M25631 Stiffness of right wrist, not elsewhere classified: Secondary | ICD-10-CM | POA: Diagnosis not present

## 2019-11-20 DIAGNOSIS — M6281 Muscle weakness (generalized): Secondary | ICD-10-CM | POA: Diagnosis not present

## 2019-11-20 DIAGNOSIS — R208 Other disturbances of skin sensation: Secondary | ICD-10-CM

## 2019-11-20 NOTE — Therapy (Signed)
[place] [facility] [address] [place], [address] Phone: [phone]   Fax:  [phone]  Occupational Therapy Evaluation  Patient Details  Name: Michael Schmitt MRN: [id number] Date of Birth: [birth year] Referring Provider (OT): Dr Ashok Pall   Encounter Date: 11/20/2019   OT End of Session - 11/20/19 1707    Visit Number 1    Number of Visits 12    Date for OT Re-Evaluation 01/01/20    OT Start Time 1505    OT Stop Time 1617    OT Time Calculation (min) 72 min    Activity Tolerance Patient tolerated treatment well    Behavior During Therapy [facility] for tasks assessed/performed           Past Medical History:  Diagnosis Date  . Anxiety   . Anxiety   . Arthritis    cerv. stenosis, spondylosis, HNP- lower back , has been followed in pain clinic, has  had injection s in cerv. area  . Blood dyscrasia    told that when he was young he was a" free bleeder"  . CAD (coronary artery disease)   . Cervical spondylosis without myelopathy 07/24/2014  . Cervicogenic headache 07/24/2014  . Chronic kidney disease    renal calculi- passed spontaneously  . Depression   . Diabetes mellitus without complication ([place])   . Fatty liver   . GERD (gastroesophageal reflux disease)   . Headache(784.0)   . Hyperlipidemia   . Hypertension   . 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 Mental disorder   . MI, old   . RLS (restless legs syndrome)    detected on sleep study  . Shortness of breath   . Ventricular fibrillation ([place]) 04/05/2016    Past Surgical History:  Procedure Laterality Date  . ANTERIOR CERVICAL DECOMP/DISCECTOMY FUSION  11/13/2011   Procedure: ANTERIOR CERVICAL DECOMPRESSION/DISCECTOMY FUSION 1 LEVEL;  Surgeon: [name], MD;  Location: [place] [facility];  Service: Neurosurgery;  Laterality: N/A;  Anterior Cervical Decompression/discectomy Fusion. Cervical three-four.  Marland Kitchen  CARDIAC CATHETERIZATION N/A 01/01/2015   Procedure: Left Heart Cath and Coronary Angiography;  Surgeon: [name], MD;  Location: [place] CV LAB;  Service: Cardiovascular;  Laterality: N/A;  . CARDIAC CATHETERIZATION N/A 04/05/2016   Procedure: Left Heart Cath and Coronary Angiography;  Surgeon: Belva Crome, MD;  Location: [place] CV LAB;  Service: Cardiovascular;  Laterality: N/A;  . CARDIAC CATHETERIZATION N/A 04/05/2016   Procedure: Coronary Stent Intervention;  Surgeon: Belva Crome, MD;  Location: [place] CV LAB;  Service: Cardiovascular;  Laterality: N/A;  . CARDIAC CATHETERIZATION N/A 04/05/2016   Procedure: Intravascular Ultrasound/IVUS;  Surgeon: Belva Crome, MD;  Location: [place] [facility] LAB;  Service: Cardiovascular;  Laterality: N/A;  . NASAL SINUS SURGERY     2005    There were no vitals filed for this visit.   Subjective Assessment - 11/20/19 1658    Subjective  My finger tips where they amputated it  really hurts - when I try and reach for something or turn doorknob - and my hand is stiff    Pertinent History Michael Schmitt is a 49 y.o. 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 While admitted, he was evaluated by plastic surgery and underwent partial amputation of right 3rd (at DIP), 4th (at PIP) and fifth (at DIP) fingers on 10/15/19. He presents today for first post-operative evaluation. Patient reports ongoing pain, only managed with oxycodone. He runs out of prescription tomorrow. He was not on any narcotic pain medication prior to accident.    Patient  Stated Goals Want the pain and motion better so I can use my hand again -    Currently in Pain? Yes    Pain Score 7     Pain Location Hand   tips of 2rd thru 5th   Pain Orientation Right    Pain Descriptors / Indicators Tender;Throbbing;Tightness;Burning    Pain Type Acute pain;Surgical pain    Pain Onset More than a month ago    Pain Frequency Constant    Aggravating Factors  when reaching and bumping them             [facility] OT Assessment - 11/20/19 0001      Assessment   Medical Diagnosis R 3rd thru 5th partial amputation , Guyons canal release     Referring Provider (OT) Dr Ashok Pall    Onset Date/Surgical Date 10/15/19    Hand Dominance Right    Next MD Visit --   middle Sept     Home  Environment   Lives With Family      Prior Function   Vocation On disability    Leisure Fish, watch tv,       AROM   Right Wrist Extension 50 Degrees    Right Wrist Flexion 75 Degrees    Left Wrist Extension 70 Degrees    Left Wrist Flexion 88 Degrees      Right Hand AROM   R Thumb Opposition to Index --   Opposition to base of 5th    R Index  MCP 0-90 75 Degrees    R Index PIP 0-100 90 Degrees    R Long  MCP 0-90 65 Degrees    R Long PIP 0-100 75 Degrees   -20   R Ring  MCP 0-90 65 Degrees   -15   R Ring PIP 0-100 --   amputed   R Little  MCP 0-90 50 Degrees   -10   R Little PIP 0-100 30 Degrees   -20          edema:  5th PIP R 6 cm , L 5.5 cm  4th prox phalanges R 7 cm , L  6.4 cm           OT Treatments/Exercises (OP) - 11/20/19 0001      RUE Contrast Bath   Time 9 minutes    Comments prior to soft tissue and ROM - to do at home           Review with pt and sister HEP -  Contrast 2-3 x day  Scar massage to volar wrist scar  cica scar pad for night time use - desensitization to stumps of 3rd and 5th  Rougher textures, massage and tapping on thigh and towel CT spread by sister Can use silicon digi sleeve for 5th and 3rd during day if using hand and  hitting tips of fingers   cont to apply dressing and cover to 4th stump  Tendon glides - with composite fist to 4 cm foam block and then 2-3 cm foam block if no pain Opposition to all digits - pick up 1 cm foam block Stretch for prayer stretch for wrist extention , flexion         OT Education - 11/20/19 1706    Education Details findings of eval and HEP    Person(s) Educated Patient   sister   Methods Explanation;Demonstration;Tactile cues;Verbal cues;Handout    Comprehension Verbal cues required;Returned demonstration;Verbalized understanding            OT Short Term Goals - 11/20/19 1717      OT SHORT TERM GOAL #1   Title Pt to be independent in HEP to tolerate difference textures on stumps and reaching for objects with R hand    Baseline cannot reach - pain 7/10 when touching hard surface, tolerate light massage but not rougher textures or tapping    Time 4    Period Weeks    Status New    Target Date 12/18/19      OT SHORT TERM GOAL #2   Title R digits AROM improve in [place] and PIP's  flexoin to [facility] to grip untencil,  squeez washcloth    Baseline MC's 50-75 degrees, PIP's 30-90 -    Time 4    Period Weeks    Status New    Target Date 12/18/19             OT Long Term Goals - 11/20/19 1720      OT LONG TERM GOAL #1   Title R hand digits extention increase to WNL to put hand in pocket and glove    Baseline extention MC's -10 to -15, PIP's -20    Time 5    Period Weeks    Status New    Target Date 12/18/19      OT LONG TERM GOAL #2   Title R hand grip strength increase to more than 40% compare to L hand to squeeze washcloth , and hold fishing rod    Baseline NT - 5 wks s/p - still open area on 4th stump    Time 6    Period Weeks    Status New    Target Date 01/01/20      OT LONG TERM GOAL #3   Title Pain on PRHWE in R hand decrease with more than 20 points    Baseline at eval PRWHE score for pain 38/50    Time 6    Period Weeks    Status New     Target Date 01/01/20                 Plan - 11/20/19 1708    Clinical Impression Statement Michael Schmitt is a 49 y.o. 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 Overall, healing well bandaid still on 4th stump with 1/2 cm open area - pt present edema , hyper sensitivity on stumps , and decrease ROM in all digits and wrist flexion , ext- decrease strength- limiting his functional use of R domimant hand in ADL's and IADL's    OT  Occupational Profile and History Problem Focused Assessment - Including review of records relating to presenting problem    Occupational performance deficits (Please refer to evaluation for details): ADL's;IADL's;Rest and Sleep;Play;Leisure    Body Structure / Function / Physical Skills ADL;Decreased knowledge of precautions;FMC;Scar mobility;ROM;UE functional use;Flexibility;Sensation;Dexterity;Edema;Skin integrity;Strength;Pain;IADL    Rehab Potential Good    Clinical Decision Making Limited treatment options, no task modification necessary    Comorbidities Affecting Occupational Performance: May have comorbidities impacting occupational performance   extended medical history   Modification or Assistance to Complete Evaluation  No modification of tasks or assist necessary to complete eval    OT Frequency 2x / week    OT Duration 6 weeks    OT Treatment/Interventions Self-care/ADL training;Paraffin;Fluidtherapy;Contrast Bath;DME and/or AE instruction;Therapeutic exercise;Manual Therapy;Passive range of motion;Scar mobilization;Splinting;Patient/family education    Plan assess progress with HEP      OT Home Exercise Plan see pt insturction    Consulted and Agree with Plan of Care Patient           Patient will benefit from skilled therapeutic intervention in order to improve the following deficits and impairments:   Body Structure / Function / Physical Skills: ADL, Decreased knowledge of precautions, FMC, Scar mobility, ROM, UE functional use, Flexibility, Sensation, Dexterity, Edema, Skin integrity, Strength, Pain, IADL       Visit Diagnosis: Amputation of digit of right hand, subsequent encounter - Plan: Ot plan of care cert/re-cert  Scar condition and fibrosis of skin - Plan: Ot plan of care cert/re-cert  Other disturbances of skin sensation - Plan: Ot plan of care cert/re-cert  Stiffness of right hand, not elsewhere classified - Plan: Ot plan of care cert/re-cert  Stiffness of right wrist, not elsewhere classified - Plan: Ot plan of care cert/re-cert  Muscle weakness (generalized) - Plan: Ot plan of care cert/re-cert    Problem List Patient Active Problem List   Diagnosis Date Noted  . Lithium toxicity   . Hyponatremia 01/24/2019  . Substance induced mood disorder ([place]) 11/22/2018  . Alcohol abuse with alcohol-induced mood disorder ([place]) 09/21/2018  . Overdose 05/17/2018  . Depression, major, recurrent, severe with psychosis ([place]) 03/30/2018  . Personality disorder ([place]) 12/31/2017  . MDD (major depressive disorder), recurrent episode, severe ([place]) 11/20/2017  . Dysthymia 10/26/2017  . Major depressive disorder, recurrent episode, moderate ([place]) 03/10/2017  . Generalized anxiety disorder 02/05/2017  . Chronic neck pain (Primary Area of Pain) (Left) 11/22/2016  . Cervical facet syndrome (Left) 11/22/2016  . Cervical radiculitis (Left) 11/22/2016  . History of cervical spinal surgery 11/22/2016  . Cervical spondylosis 11/22/2016  . Chronic shoulder pain (Secondary Area of Pain) (Left) 11/22/2016  . Arthropathy of shoulder (Left) 11/22/2016  . Chronic low  back pain [facility] Area of Pain) (Bilateral) (L>R) 11/22/2016  . Lumbar facet syndrome (Bilateral) (L>R) 11/22/2016  . Chronic pain syndrome 11/21/2016  . Long term prescription benzodiazepine use 11/21/2016  . Opiate use 11/21/2016  . Type 2 diabetes mellitus without complication, without long-term current use of insulin ([place]) 10/19/2016  . Chronic Suicidal ideation 09/15/2016  . Coronary artery disease 09/12/2016  . HTN (hypertension) 06/26/2016  . GERD (gastroesophageal reflux disease) 06/26/2016  . Alcohol use disorder, severe, 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[place]) 06/26/2016  . Benzodiazepine abuse ([place]) 06/11/2016  . Sedative, hypnotic or anxiolytic use disorder, severe, dependence ([place]) 05/12/2016  . Ventricular fibrillation ([place]) 04/05/2016  . Combined hyperlipidemia 01/05/2016  . Cervical spondylosis without myelopathy 07/24/2014  . Severe episode of recurrent major depressive disorder,  without psychotic features ([place]) 04/15/2013  . Tobacco use disorder 10/20/2010  . Asthma 10/20/2010    Rosalyn Gess OTR/L,CLT 11/20/2019, 5:27 PM  [place] [facility] [address], [place], [address] Phone: [phone]   Fax:  [phone]  Name: Michael Schmitt MRN: [id number] Date of Birth: [birth year]

## 2019-11-25 ENCOUNTER — Other Ambulatory Visit: Payer: Self-pay

## 2019-11-25 ENCOUNTER — Ambulatory Visit: Payer: Medicare Other | Admitting: Occupational Therapy

## 2019-11-25 DIAGNOSIS — M25631 Stiffness of right wrist, not elsewhere classified: Secondary | ICD-10-CM | POA: Diagnosis not present

## 2019-11-25 DIAGNOSIS — M6281 Muscle weakness (generalized): Secondary | ICD-10-CM

## 2019-11-25 DIAGNOSIS — M25641 Stiffness of right hand, not elsewhere classified: Secondary | ICD-10-CM

## 2019-11-25 DIAGNOSIS — L905 Scar conditions and fibrosis of skin: Secondary | ICD-10-CM | POA: Diagnosis not present

## 2019-11-25 DIAGNOSIS — R208 Other disturbances of skin sensation: Secondary | ICD-10-CM

## 2019-11-25 DIAGNOSIS — S68119D Complete traumatic metacarpophalangeal amputation of unspecified finger, subsequent encounter: Secondary | ICD-10-CM

## 2019-11-25 DIAGNOSIS — R209 Unspecified disturbances of skin sensation: Secondary | ICD-10-CM | POA: Diagnosis not present

## 2019-11-25 NOTE — Therapy (Signed)
[facility] [address] [place], [address] Phone: [phone]   Fax:  [phone]  Occupational Therapy Treatment  Patient Details  Name: Michael Schmitt MRN: [id number] Date of Birth: [birth year] Referring Provider (OT): Dr Ashok Pall   Encounter Date: 11/25/2019   OT End of Session - 11/25/19 1256    Visit Number 2    Number of Visits 12    Date for OT Re-Evaluation 01/01/20    OT Start Time 1048    OT Stop Time 1136    OT Time Calculation (min) 48 min    Activity Tolerance Patient tolerated treatment well    Behavior During Therapy [facility] for tasks assessed/performed           Past Medical History:  Diagnosis Date  . Anxiety   . Anxiety   . Arthritis    cerv. stenosis, spondylosis, HNP- lower back , has been followed in pain clinic, has  had injection s in cerv. area  . Blood dyscrasia    told that when he was young he was a" free bleeder"  . CAD (coronary artery disease)   . Cervical spondylosis without myelopathy 07/24/2014  . Cervicogenic headache 07/24/2014  . Chronic kidney disease    renal calculi- passed spontaneously  . Depression   . Diabetes mellitus without complication ([place])   . Fatty liver   . GERD (gastroesophageal reflux disease)   . Headache(784.0)   . Hyperlipidemia   . Hypertension   . 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 Mental disorder   . MI, old   . RLS (restless legs syndrome)    detected on sleep study  . Shortness of breath   . Ventricular fibrillation ([place]) 04/05/2016    Past Surgical History:  Procedure Laterality Date  . ANTERIOR CERVICAL DECOMP/DISCECTOMY FUSION  11/13/2011   Procedure: ANTERIOR CERVICAL DECOMPRESSION/DISCECTOMY FUSION 1 LEVEL;  Surgeon: [name], MD;  Location: [place] [facility];  Service: Neurosurgery;  Laterality: N/A;  Anterior Cervical Decompression/discectomy Fusion. Cervical three-four.  Marland Kitchen CARDIAC  CATHETERIZATION N/A 01/01/2015   Procedure: Left Heart Cath and Coronary Angiography;  Surgeon: [name], MD;  Location: [place] [facility];  Service: Cardiovascular;  Laterality: N/A;  . CARDIAC CATHETERIZATION N/A 04/05/2016   Procedure: Left Heart Cath and Coronary Angiography;  Surgeon: Belva Crome, MD;  Location: [place] [facility];  Service: Cardiovascular;  Laterality: N/A;  . CARDIAC CATHETERIZATION N/A 04/05/2016   Procedure: Coronary Stent Intervention;  Surgeon: Belva Crome, MD;  Location: [place] [facility];  Service: Cardiovascular;  Laterality: N/A;  . CARDIAC CATHETERIZATION N/A 04/05/2016   Procedure: Intravascular Ultrasound/IVUS;  Surgeon: Belva Crome, MD;  Location: [place] [facility];  Service: Cardiovascular;  Laterality: N/A;  . NASAL SINUS SURGERY     2005    There were no vitals filed for this visit.   Subjective Assessment - 11/25/19 1102    Subjective  I done my exercises - feels like it is about the same but my finger tips not hurting as bad - those socks helped    Pertinent History Michael Schmitt is a 49 y.o. 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 While admitted, he was evaluated by plastic surgery and underwent partial amputation of right 3rd (at DIP), 4th (at PIP) and fifth (at DIP) fingers on 10/15/19. He presents today for first post-operative evaluation. Patient reports ongoing pain, only managed with oxycodone. He runs out of prescription tomorrow. He was not on any narcotic pain medication prior to accident.    Patient Stated Goals Want the  pain and motion better so I can use my hand again -    Currently in Pain? Yes    Pain Score 5     Pain Location Hand    Pain Orientation Right    Pain Descriptors / Indicators Tender;Throbbing;Tightness    Pain Type Acute pain;Surgical pain    Pain Onset More than a month ago    Pain Frequency Constant              [facility] OT Assessment - 11/25/19 0001      AROM   Right Wrist Extension 60 Degrees    Right Wrist Flexion 81 Degrees      Right Hand AROM   R Index  MCP 0-90 75 Degrees    R Index PIP 0-100 90 Degrees    R Long  MCP 0-90 80 Degrees    R Long PIP 0-100 85 Degrees   -25   R Ring  MCP 0-90 80 Degrees   0 ext   R Little  MCP 0-90 75 Degrees   0 ext   R Little PIP 0-100 50 Degrees   -16 ext          great progress in AROM for R hand - flexion and extention  Pt arrive with silicon sleeves on 3rd and 5th - pain decrease to 5/10 bandaid on 4th  Tolerate massage and rougher textures better on 3rd and 5th - as well as tapping on thigh and towel Cont same - pt ed on doing light coban wrap on 4th over bandaid - 3 hrs am and pm - let it breath when sitting  very light - no pulling - verbalize under standing          OT Treatments/Exercises (OP) - 11/25/19 0001      RUE Contrast Bath   Time 9 minutes    Comments prior to soft tissue , and ROM              Scar massage to volar wrist scar with mini massager and xtractor on volar wrist- done graston tool nr 2 sweeping on volar forearm , scar massage by OT and review again with pt  Issued new cica scar pad for night time use -  Pt's dog got his  desensitization to stumps of 3rd and 5th Rougher textures, massage and tapping on thigh and towel CT spread by sister to cont with - and OT done this date Can use silicon digi sleeve for 5th and 3rd during day if using hand and hitting tips of fingers   PROM for PIP and MC extention done on table -3 x 30 sec Gentle PROM for PIP of 3rd and 5th - 10 reps   Tendon  glides -  MC flexion with PIP extention , blocked intrinsic fist  And  composite fist to 2 cm foam block and no pain Can do composite gentle flexion to 5th to 2 cm  foam blocki Opposition to all digits - pick up 1 cm foam block Stretch for prayer stretch for wrist extention , flexion cont with          OT Education - 11/25/19 1256    Education Details HEP changes    Person(s) Educated Patient    Methods Explanation;Demonstration;Tactile cues;Verbal cues;Handout    Comprehension Verbal cues required;Returned demonstration;Verbalized understanding            OT Short Term Goals - 11/20/19 1717      OT SHORT TERM GOAL #1   Title Pt to be independent in HEP to tolerate difference textures on stumps and reaching for objects with R hand    Baseline cannot reach - pain 7/10 when touching hard surface, tolerate light massage but not rougher textures or tapping    Time 4    Period Weeks    Status New    Target Date 12/18/19      OT SHORT TERM GOAL #2   Title R digits AROM improve in [place] and PIP's  flexoin to [facility] to grip untencil,  squeez washcloth    Baseline MC's 50-75 degrees, PIP's 30-90 -    Time 4    Period Weeks    Status New    Target Date 12/18/19             OT Long Term Goals - 11/20/19 1720      OT LONG TERM GOAL #1   Title R hand digits extention increase to WNL to put hand in pocket and glove    Baseline extention MC's -10 to -15, PIP's -20    Time 5    Period Weeks    Status New    Target Date 12/18/19      OT LONG TERM GOAL #2   Title R hand grip strength increase to more than 40% compare to L hand to squeeze washcloth , and hold fishing rod    Baseline NT - 5 wks s/p - still open area on 4th stump    Time 6    Period Weeks    Status New    Target Date 01/01/20      OT LONG TERM GOAL #3   Title Pain on PRHWE in R hand decrease with more than 20 points    Baseline at eval PRWHE score for pain 38/50    Time 6    Period Weeks    Status New     Target Date 01/01/20                 Plan - 11/25/19 1257    Clinical Impression Statement Pt is 6wks tomorrow partial amputation 3rd and 5th DIP , 4th PIP - pt 4th not healed yet - pt keeping it covered with bandaid - pain decrease over 5th and 3rd to 5/10 - desentitization improved greatly and AROM in extention and lfexion of digits greatly improved - pt can add PROM gentle - but not increase pain and edema - ed on doing light coban wrap on 4th stump over bandaid    OT Occupational Profile and History Problem Focused Assessment - Including review of records relating to presenting problem    Occupational performance deficits (Please refer to evaluation for details): ADL's;IADL's;Rest and Sleep;Play;Leisure    Body Structure / Function / Physical Skills ADL;Decreased knowledge of precautions;FMC;Scar mobility;ROM;UE functional use;Flexibility;Sensation;Dexterity;Edema;Skin integrity;Strength;Pain;IADL    Rehab Potential Good    Clinical Decision Making Limited treatment options, no task modification  necessary    Comorbidities Affecting Occupational Performance: May have comorbidities impacting occupational performance    Modification or Assistance to Complete Evaluation  No modification of tasks or assist necessary to complete eval    OT Frequency 2x / week    OT Duration 6 weeks    OT Treatment/Interventions Self-care/ADL training;Paraffin;Fluidtherapy;Contrast Bath;DME and/or AE instruction;Therapeutic exercise;Manual Therapy;Passive range of motion;Scar mobilization;Splinting;Patient/family education    Plan assess progress with HEP    OT Home Exercise Plan see pt insturction    Consulted and Agree with Plan of Care Patient           Patient will benefit from skilled therapeutic intervention in order to improve the following deficits and impairments:   Body Structure / Function / Physical Skills: ADL, Decreased knowledge of precautions, FMC, Scar mobility, ROM, UE functional use,  Flexibility, Sensation, Dexterity, Edema, Skin integrity, Strength, Pain, IADL       Visit Diagnosis: Amputation of digit of right hand, subsequent encounter  Scar condition and fibrosis of skin  Other disturbances of skin sensation  Stiffness of right hand, not elsewhere classified  Stiffness of right wrist, not elsewhere classified  Muscle weakness (generalized)    Problem List Patient Active Problem List   Diagnosis Date Noted  . Lithium toxicity   . Hyponatremia 01/24/2019  . Substance induced mood disorder ([place]) 11/22/2018  . Alcohol abuse with alcohol-induced mood disorder ([place]) 09/21/2018  . Overdose 05/17/2018  . Depression, major, recurrent, severe with psychosis ([place]) 03/30/2018  . Personality disorder ([place]) 12/31/2017  . MDD (major depressive disorder), recurrent episode, severe ([place]) 11/20/2017  . Dysthymia 10/26/2017  . Major depressive disorder, recurrent episode, moderate ([place]) 03/10/2017  . Generalized anxiety disorder 02/05/2017  . Chronic neck pain (Primary Area of Pain) (Left) 11/22/2016  . Cervical facet syndrome (Left) 11/22/2016  . Cervical radiculitis (Left) 11/22/2016  . History of cervical spinal surgery 11/22/2016  . Cervical spondylosis 11/22/2016  . Chronic shoulder pain (Secondary Area of Pain) (Left) 11/22/2016  . Arthropathy of shoulder (Left) 11/22/2016  . Chronic low back pain [facility] Area of Pain) (Bilateral) (L>R) 11/22/2016  . Lumbar facet syndrome (Bilateral) (L>R) 11/22/2016  . Chronic pain syndrome 11/21/2016  . Long term prescription benzodiazepine use 11/21/2016  . Opiate use 11/21/2016  . Type 2 diabetes mellitus without complication, without long-term current use of insulin ([employer]) 10/19/2016  . Chronic Suicidal ideation 09/15/2016  . Coronary artery disease 09/12/2016  . HTN (hypertension) 06/26/2016  . GERD (gastroesophageal reflux disease) 06/26/2016  . Alcohol use disorder, severe, 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[place]) 06/26/2016   . Benzodiazepine abuse ([place]) 06/11/2016  . Sedative, hypnotic or anxiolytic use disorder, severe, dependence ([place]) 05/12/2016  . Ventricular fibrillation ([place]) 04/05/2016  . Combined hyperlipidemia 01/05/2016  . Cervical spondylosis without myelopathy 07/24/2014  . Severe episode of recurrent major depressive disorder, without psychotic features ([place]) 04/15/2013  . Tobacco use disorder 10/20/2010  . Asthma 10/20/2010    Rosalyn Gess OTR/l,CLT 11/25/2019, 1:01 PM  [facility] [address], [place], [address] Phone: [phone]   Fax:  [phone]  Name: Niccolo Burggraf MRN: [id number] Date of Birth: May 06, 1970

## 2019-11-27 ENCOUNTER — Emergency Department
Admission: EM | Admit: 2019-11-27 | Discharge: 2019-11-27 | Discharge status: Against Medical Advice | Payer: Medicare Other

## 2019-11-27 NOTE — ED Notes (Signed)
No answer when called several times from lobby 

## 2019-11-28 ENCOUNTER — Ambulatory Visit: Payer: Medicare Other | Admitting: Occupational Therapy

## 2019-11-28 DIAGNOSIS — I10 Essential (primary) hypertension: Secondary | ICD-10-CM | POA: Diagnosis not present

## 2019-11-28 DIAGNOSIS — E119 Type 2 diabetes mellitus without complications: Secondary | ICD-10-CM | POA: Diagnosis not present

## 2019-12-08 ENCOUNTER — Other Ambulatory Visit: Payer: Self-pay

## 2019-12-08 ENCOUNTER — Ambulatory Visit: Payer: Medicare Other | Attending: Plastic and Reconstructive Surgery | Admitting: Occupational Therapy

## 2019-12-08 DIAGNOSIS — R209 Unspecified disturbances of skin sensation: Secondary | ICD-10-CM | POA: Insufficient documentation

## 2019-12-08 DIAGNOSIS — M25641 Stiffness of right hand, not elsewhere classified: Secondary | ICD-10-CM | POA: Diagnosis not present

## 2019-12-08 DIAGNOSIS — L905 Scar conditions and fibrosis of skin: Secondary | ICD-10-CM

## 2019-12-08 DIAGNOSIS — S68119D Complete traumatic metacarpophalangeal amputation of unspecified finger, subsequent encounter: Secondary | ICD-10-CM | POA: Diagnosis not present

## 2019-12-08 DIAGNOSIS — M25631 Stiffness of right wrist, not elsewhere classified: Secondary | ICD-10-CM | POA: Insufficient documentation

## 2019-12-08 DIAGNOSIS — M6281 Muscle weakness (generalized): Secondary | ICD-10-CM

## 2019-12-08 DIAGNOSIS — R208 Other disturbances of skin sensation: Secondary | ICD-10-CM

## 2019-12-08 NOTE — Therapy (Signed)
[facility] [address] [place], [address] Phone: [phone]   Fax:  [phone]  Occupational Therapy Treatment  Patient Details  Name: Michael Schmitt MRN: [id number] Date of Birth: [birth year] Referring Provider (OT): Dr Ashok Pall   Encounter Date: 12/08/2019   OT End of Session - 12/08/19 1516    Visit Number 3    Number of Visits 12    Date for OT Re-Evaluation 01/01/20    OT Start Time 1400    OT Stop Time 1450    OT Time Calculation (min) 50 min    Activity Tolerance Patient tolerated treatment well    Behavior During Therapy Michael Schmitt ([facility]) for tasks assessed/performed           Past Medical History:  Diagnosis Date  . Anxiety   . Anxiety   . Arthritis    cerv. stenosis, spondylosis, HNP- lower back , has been followed in pain clinic, has  had injection s in cerv. area  . Blood dyscrasia    told that when he was young he was a" free bleeder"  . CAD (coronary artery disease)   . Cervical spondylosis without myelopathy 07/24/2014  . Cervicogenic headache 07/24/2014  . Chronic kidney disease    renal calculi- passed spontaneously  . Depression   . Diabetes mellitus without complication ([place])   . Fatty liver   . GERD (gastroesophageal reflux disease)   . Headache(784.0)   . Hyperlipidemia   . Hypertension   . 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 Mental disorder   . MI, old   . RLS (restless legs syndrome)    detected on sleep study  . Shortness of breath   . Ventricular fibrillation ([place]) 04/05/2016    Past Surgical History:  Procedure Laterality Date  . ANTERIOR CERVICAL DECOMP/DISCECTOMY FUSION  11/13/2011   Procedure: ANTERIOR CERVICAL DECOMPRESSION/DISCECTOMY FUSION 1 LEVEL;  Surgeon: [name], MD;  Location: [facility];  Service: Neurosurgery;  Laterality: N/A;  Anterior Cervical Decompression/discectomy Fusion. Cervical three-four.  Marland Kitchen CARDIAC  CATHETERIZATION N/A 01/01/2015   Procedure: Left Heart Cath and Coronary Angiography;  Surgeon: [name], MD;  Location: [facility];  Service: Cardiovascular;  Laterality: N/A;  . CARDIAC CATHETERIZATION N/A 04/05/2016   Procedure: Left Heart Cath and Coronary Angiography;  Surgeon: Belva Crome, MD;  Location: [facility];  Service: Cardiovascular;  Laterality: N/A;  . CARDIAC CATHETERIZATION N/A 04/05/2016   Procedure: Coronary Stent Intervention;  Surgeon: Belva Crome, MD;  Location: [facility];  Service: Cardiovascular;  Laterality: N/A;  . CARDIAC CATHETERIZATION N/A 04/05/2016   Procedure: Intravascular Ultrasound/IVUS;  Surgeon: Belva Crome, MD;  Location: [facility];  Service: Cardiovascular;  Laterality: N/A;  . NASAL SINUS SURGERY     2005    There were no vitals filed for this visit.   Subjective Assessment - 12/08/19 1514    Subjective  Sensitivity in fingers getting better - except hitting something hard with the tip of middle finger - ring finger closing up - still hard to do signature    Pertinent History Michael Schmitt is a 49 y.o. 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 While admitted, he was evaluated by plastic surgery and underwent partial amputation of right 3rd (at DIP), 4th (at PIP) and fifth (at DIP) fingers on 10/15/19. He presents today for first post-operative evaluation. Patient reports ongoing pain, only managed with oxycodone. He runs out of prescription tomorrow. He was not on any narcotic pain medication prior to  accident.    Patient Stated Goals Want the pain and motion better so I can use my hand again -    Currently in Pain? No/denies              [facility] OT Assessment - 12/08/19 0001      Strength   Right Hand Lateral Pinch 23 lbs    Right Hand 3 Point Pinch 14 lbs    Left Hand Lateral Pinch 27 lbs    Left Hand 3 Point Pinch 25 lbs      Right Hand AROM   R Index  MCP 0-90 85 Degrees    R Index PIP 0-100 92 Degrees    R Long  MCP 0-90 85 Degrees    R Long PIP 0-100 90 Degrees    R Ring  MCP 0-90 82 Degrees    R Little  MCP 0-90 80 Degrees    R Little PIP 0-100 60 Degrees          cont to make progress in AROM in R hand digits every time pt comes in  Cont to have PIP extention 5th -15 and PIP 3rd -25 degrees          add this date LMB splint for PIP extention  - pt ed on using at home - 3-5 min at the most and during contrast - when using heat -followed by PROM PIP extention and rolling over foam roller   OT Treatments/Exercises (OP) - 12/08/19 0001      RUE Contrast Bath   Time 9 minutes    Comments LMB splint on 5th and 3rd for PIP extention               Scar massage to volar wrist scar with mini massager and xtractor on volar wrist- done graston tool nr 2 sweeping on volar forearm, palm and volar hand /digits  , scar massage by OT and review again with pt  Issued new cica scar pad for night time use -  Pt's dog got his again desensitization to stumps of 3rd and 5th Rougher textures, massage and tapping on thigh and towel tolerating well now CT spread by sister to cont with - and OT done this date Can use silicon digi sleeve for 5th and 3rd at night time- and then issued one for daytime for 4th - but only wear 2 hrs at ime  To keep eye on skin integrity    PROM for PIP and MC extention done on table -3 x 30 sec Gentle PROM for PIP of 3rd and 5th - 10 reps   Tendon glides -   AAROM for MC flexion 90 degrees  And  composite fist to 2 cm foam block and no  pain Can do gentle PROM flexion composite for 2nd , 3rd and 5th to 2 cm foam block MC flexion with PIP extention , blocked intrinsic fist  AROM to 2 cm foam block  Opposition to all  digits - pick up 1 cm foam block Stretch for prayer stretch for wrist extention , flexioncont with   Add this date light blue putty for gripping , 3 point grip  Pain free  12 reps  1 x day -REINFORCE not to over do -or squeeze to hard        OT Education - 12/08/19 1516    Education Details HEP changes    Person(s) Educated Patient    Methods Explanation;Demonstration;Tactile cues;Verbal cues;Handout    Comprehension Verbal cues required;Returned demonstration;Verbalized understanding            OT Short Term Goals - 11/20/19 1717      OT SHORT TERM GOAL #1   Title Pt to be independent in HEP to tolerate difference textures on stumps and reaching for objects with R hand    Baseline cannot reach - pain 7/10 when touching hard surface, tolerate light massage but not rougher textures or tapping    Time 4    Period Weeks    Status New    Target Date 12/18/19      OT SHORT TERM GOAL #2   Title R digits AROM improve in [place] and PIP's  flexoin to [place] Schmitt to grip untencil,  squeez washcloth    Baseline MC's 50-75 degrees, PIP's 30-90 -    Time 4    Period Weeks    Status New    Target Date 12/18/19             OT Long Term Goals - 11/20/19 1720      OT LONG TERM GOAL #1   Title R hand digits extention increase to WNL to put hand in pocket and glove    Baseline extention MC's -10 to -15, PIP's -20    Time 5    Period Weeks    Status New    Target Date 12/18/19      OT LONG TERM GOAL #2   Title R hand grip strength increase to more than 40% compare to L hand to squeeze washcloth , and hold fishing rod    Baseline NT - 5 wks s/p - still open area on 4th stump    Time 6    Period Weeks    Status New    Target Date 01/01/20      OT LONG TERM GOAL #3   Title Pain on PRHWE in R hand  decrease with more than 20 points    Baseline at eval PRWHE score for pain 38/50    Time 6    Period Weeks    Status New    Target Date 01/01/20                 Plan - 12/08/19 1516    Clinical Impression Statement Pt is 7 wks tomorrow partial amputation 3rd  and 5th DIP and 4th PIP - this date did not need bandages on 4th - great progress from [facility] in AROM of digits , edema and hyper sensitivity - pt to cont to work on PIP extention of 3rd adn 5th -and flexion  - grip and prehension strength- did add light putty- reinforce for pt to only do 1 x day and gentle - not over do - will have increase edema and pain - if over doing it    OT Occupational Profile and History Problem Focused Assessment - Including review of records relating to presenting problem    Occupational performance deficits (Please refer to evaluation  for details): ADL's;IADL's;Rest and Sleep;Play;Leisure    Body Structure / Function / Physical Skills ADL;Decreased knowledge of precautions;FMC;Scar mobility;ROM;UE functional use;Flexibility;Sensation;Dexterity;Edema;Skin integrity;Strength;Pain;IADL    Rehab Potential Good    Clinical Decision Making Limited treatment options, no task modification necessary    Comorbidities Affecting Occupational Performance: May have comorbidities impacting occupational performance    Modification or Assistance to Complete Evaluation  No modification of tasks or assist necessary to complete eval    OT Frequency 2x / week    OT Duration 6 weeks    OT Treatment/Interventions Self-care/ADL training;Paraffin;Fluidtherapy;Contrast Bath;DME and/or AE instruction;Therapeutic exercise;Manual Therapy;Passive range of motion;Scar mobilization;Splinting;Patient/family education    Plan assess progress with HEP    OT Home Exercise Plan see pt insturction    Consulted and Agree with Plan of Care Patient           Patient will benefit from skilled therapeutic intervention in order to improve the  following deficits and impairments:   Body Structure / Function / Physical Skills: ADL, Decreased knowledge of precautions, FMC, Scar mobility, ROM, UE functional use, Flexibility, Sensation, Dexterity, Edema, Skin integrity, Strength, Pain, IADL       Visit Diagnosis: Amputation of digit of right hand, subsequent encounter  Scar condition and fibrosis of skin  Other disturbances of skin sensation  Stiffness of right hand, not elsewhere classified  Stiffness of right wrist, not elsewhere classified  Muscle weakness (generalized)    Problem List Patient Active Problem List   Diagnosis Date Noted  . Lithium toxicity   . Hyponatremia 01/24/2019  . Substance induced mood disorder ([place]) 11/22/2018  . Alcohol abuse with alcohol-induced mood disorder ([place]) 09/21/2018  . Overdose 05/17/2018  . Depression, major, recurrent, severe with psychosis ([place]) 03/30/2018  . Personality disorder ([place]) 12/31/2017  . MDD (major depressive disorder), recurrent episode, severe ([place]) 11/20/2017  . Dysthymia 10/26/2017  . Major depressive disorder, recurrent episode, moderate ([place]) 03/10/2017  . Generalized anxiety disorder 02/05/2017  . Chronic neck pain (Primary Area of Pain) (Left) 11/22/2016  . Cervical facet syndrome (Left) 11/22/2016  . Cervical radiculitis (Left) 11/22/2016  . History of cervical spinal surgery 11/22/2016  . Cervical spondylosis 11/22/2016  . Chronic shoulder pain (Secondary Area of Pain) (Left) 11/22/2016  . Arthropathy of shoulder (Left) 11/22/2016  . Chronic low back pain [facility] Area of Pain) (Bilateral) (L>R) 11/22/2016  . Lumbar facet syndrome (Bilateral) (L>R) 11/22/2016  . Chronic pain syndrome 11/21/2016  . Long term prescription benzodiazepine use 11/21/2016  . Opiate use 11/21/2016  . Type 2 diabetes mellitus without complication, without long-term current use of insulin ([place]) 10/19/2016  . Chronic Suicidal ideation 09/15/2016  . Coronary artery disease  09/12/2016  . HTN (hypertension) 06/26/2016  . GERD (gastroesophageal reflux disease) 06/26/2016  . Alcohol use disorder, severe, 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[place]) 06/26/2016  . Benzodiazepine abuse ([place]) 06/11/2016  . Sedative, hypnotic or anxiolytic use disorder, severe, dependence ([place]) 05/12/2016  . Ventricular fibrillation ([place]) 04/05/2016  . Combined hyperlipidemia 01/05/2016  . Cervical spondylosis without myelopathy 07/24/2014  . Severe episode of recurrent major depressive disorder, without psychotic features ([place]) 04/15/2013  . Tobacco use disorder 10/20/2010  . Asthma 10/20/2010    Rosalyn Gess OTR/L,CLT 12/08/2019, 3:21 PM  [facility] [address], [place], [address] Phone: [phone]   Fax:  [phone]  Name: Michael Schmitt MRN: [id number] Date of Birth: [birth year]

## 2019-12-12 ENCOUNTER — Other Ambulatory Visit: Payer: Self-pay

## 2019-12-12 ENCOUNTER — Ambulatory Visit: Payer: Medicare Other | Admitting: Occupational Therapy

## 2019-12-12 DIAGNOSIS — R209 Unspecified disturbances of skin sensation: Secondary | ICD-10-CM | POA: Diagnosis not present

## 2019-12-12 DIAGNOSIS — S68119D Complete traumatic metacarpophalangeal amputation of unspecified finger, subsequent encounter: Secondary | ICD-10-CM

## 2019-12-12 DIAGNOSIS — M25631 Stiffness of right wrist, not elsewhere classified: Secondary | ICD-10-CM | POA: Diagnosis not present

## 2019-12-12 DIAGNOSIS — M6281 Muscle weakness (generalized): Secondary | ICD-10-CM

## 2019-12-12 DIAGNOSIS — M25641 Stiffness of right hand, not elsewhere classified: Secondary | ICD-10-CM | POA: Diagnosis not present

## 2019-12-12 DIAGNOSIS — L905 Scar conditions and fibrosis of skin: Secondary | ICD-10-CM

## 2019-12-12 DIAGNOSIS — R208 Other disturbances of skin sensation: Secondary | ICD-10-CM

## 2019-12-12 NOTE — Therapy (Signed)
[place] [facility] [address] [place], [address] Phone: [phone]   Fax:  [phone]  Occupational Therapy Treatment  Patient Details  Name: Michael Schmitt MRN: [id number] Date of Birth: 1970-08-31 Referring Provider (OT): Dr Ashok Pall   Encounter Date: 12/12/2019   OT End of Session - 12/12/19 1511    Visit Number 4    Number of Visits 12    Date for OT Re-Evaluation 01/01/20    OT Start Time 1135    OT Stop Time 1214    OT Time Calculation (min) 39 min    Activity Tolerance Patient tolerated treatment well    Behavior During Therapy [facility] for tasks assessed/performed           Past Medical History:  Diagnosis Date  . Anxiety   . Anxiety   . Arthritis    cerv. stenosis, spondylosis, HNP- lower back , has been followed in pain clinic, has  had injection s in cerv. area  . Blood dyscrasia    told that when he was young he was a" free bleeder"  . CAD (coronary artery disease)   . Cervical spondylosis without myelopathy 07/24/2014  . Cervicogenic headache 07/24/2014  . Chronic kidney disease    renal calculi- passed spontaneously  . Depression   . Diabetes mellitus without complication ([place])   . Fatty liver   . GERD (gastroesophageal reflux disease)   . Headache(784.0)   . Hyperlipidemia   . Hypertension   . 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 Mental disorder   . MI, old   . RLS (restless legs syndrome)    detected on sleep study  . Shortness of breath   . Ventricular fibrillation ([place]) 04/05/2016    Past Surgical History:  Procedure Laterality Date  . ANTERIOR CERVICAL DECOMP/DISCECTOMY FUSION  11/13/2011   Procedure: ANTERIOR CERVICAL DECOMPRESSION/DISCECTOMY FUSION 1 LEVEL;  Surgeon: [name], MD;  Location: [place] [facility] OR[facility];  Service: Neurosurgery;  Laterality: N/A;  Anterior Cervical Decompression/discectomy Fusion. Cervical three-four.  Marland Kitchen CARDIAC  CATHETERIZATION N/A 01/01/2015   Procedure: Left Heart Cath and Coronary Angiography;  Surgeon: [name], MD;  Location: [place] CV LAB;  Service: Cardiovascular;  Laterality: N/A;  . CARDIAC CATHETERIZATION N/A 04/05/2016   Procedure: Left Heart Cath and Coronary Angiography;  Surgeon: Belva Crome, MD;  Location: [place] CV LAB;  Service: Cardiovascular;  Laterality: N/A;  . CARDIAC CATHETERIZATION N/A 04/05/2016   Procedure: Coronary Stent Intervention;  Surgeon: Belva Crome, MD;  Location: [place] CV LAB;  Service: Cardiovascular;  Laterality: N/A;  . CARDIAC CATHETERIZATION N/A 04/05/2016   Procedure: Intravascular Ultrasound/IVUS;  Surgeon: Belva Crome, MD;  Location: [place] CV LAB;  Service: Cardiovascular;  Laterality: N/A;  . NASAL SINUS SURGERY     2005    There were no vitals filed for this visit.   Subjective Assessment - 12/12/19 1508    Subjective  I did not go to the appt with surgeon - did not had ride - need to reschedule - doing okay -pain only when I hit my fingers on something hard - I did pull the scab off the tip of ring finger - that is why it is open little bit    Pertinent History Michael Schmitt is a 49 y.o. 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 While admitted, he was evaluated by plastic surgery and underwent partial amputation of right 3rd (at DIP), 4th (at PIP) and fifth (at DIP) fingers on 10/15/19. He presents today for first post-operative evaluation. Patient reports ongoing pain, only managed with oxycodone. He runs out of  prescription tomorrow. He was not on any narcotic pain medication prior to accident.    Patient Stated Goals Want the pain and motion better so I can use my hand again -    Currently in Pain? No/denies              [facility] OT Assessment - 12/12/19 0001      AROM   Right Wrist Extension 62 Degrees    Right Wrist Flexion 81 Degrees      Right Hand AROM   R Index  MCP 0-90 90 Degrees    R Index PIP 0-100 100 Degrees    R Long  MCP 0-90 85 Degrees    R Long PIP 0-100 90 Degrees   -20   R Ring  MCP 0-90 85 Degrees   -5   R Little  MCP 0-90 80 Degrees    R Little PIP 0-100 60 Degrees   -15                   OT Treatments/Exercises (OP) - 12/12/19 0001      RUE Contrast Bath   Time 9 minutes    Comments LMB splint on 3rd and 5th PIP - at [facility]            add last time  LMB splins for PIP extention  - pt ed on using at home - report his been using it - ask if can use more than 2 x day - pt can use up to 5 x day - but slight pull  And not increase edema   3-5 min at the most and during contrast - when using heat -followed by PROM PIP extention and rolling over foam roller  Cannot soak hand in water - 4th digit incision open this date again - per pt her pulled scab  Scar massage to volar wrist scar- done graston tool nr 2 sweeping on volar forearm, palm and volar hand /digits   desensitization to stumps of 3rd and 5th - doing very well  Rougher textures, massage and tapping on thigh and towel tolerating well now CT spread by sisterto cont with - and OT done this date Can use silicon digi sleeve for 5th and 3rd at night time-and band aid for 4th    PROM for PIP and MC extention done on table -3 x 30 sec   Tendon glides - AAROM for MC flexion 90 degrees - kept 3 x 30 sec  PROM flexion composite for 2nd , 3rd and 5th to 2 cm foam block MC flexion with PIP extention , blocked intrinsic fist  Andcomposite fist to 2cm foam block andno pain AROM to 2 cm foam block  and palm Opposition to all digits - pick up 1 cm foam block Stretch for prayer stretch for wrist extention , flexioncont with  Cont with  light blue putty for gripping ,  3 point grip  Pain free  12 reps  1 x day -REINFORCE not to over do -or squeeze to hard        OT Education - 12/12/19 1511    Education Details HEP changes    Person(s) Educated Patient    Methods Explanation;Demonstration;Tactile cues;Verbal cues;Handout    Comprehension Verbal cues required;Returned demonstration;Verbalized understanding            OT Short Term Goals - 11/20/19 1717      OT SHORT TERM GOAL #1   Title Pt to be independent in HEP to tolerate difference textures on stumps and reaching for objects with R hand    Baseline cannot reach - pain 7/10 when touching hard surface, tolerate light massage but not rougher textures or tapping    Time 4    Period Weeks    Status New    Target Date 12/18/19      OT SHORT TERM GOAL #2   Title R digits AROM improve in [place] and PIP's  flexoin to [facility] to grip untencil,  squeez washcloth    Baseline MC's 50-75 degrees, PIP's 30-90 -    Time 4    Period Weeks    Status New    Target Date 12/18/19             OT Long Term Goals - 11/20/19 1720      OT LONG TERM GOAL #1   Title R hand digits extention increase to WNL to put hand in pocket and glove    Baseline extention MC's -10 to -15, PIP's -20    Time 5    Period Weeks    Status New    Target Date 12/18/19      OT LONG TERM GOAL #2   Title R hand grip strength increase to more than 40% compare to L hand to squeeze washcloth , and hold fishing rod    Baseline NT - 5 wks s/p - still open area on 4th stump    Time 6    Period Weeks    Status New    Target Date 01/01/20      OT LONG TERM GOAL #3   Title Pain on PRHWE in R hand decrease with more than 20 points    Baseline at eval PRWHE score for pain 38/50    Time 6    Period Weeks    Status New    Target Date 01/01/20                  Plan - 12/12/19 1512    Clinical Impression Statement Pt is about 7 1/2 wks s/p partial amputation of 3rd and 5th DIP and 4th PIP - pt this date with incision open again 1/2 cm on 4th stump - pt report her pulled scab off about 2 days ago - pt cont to show increase AROM in flexion and extention in digits - pt to focus on 5th PIP flexion , extention at 5th and 3rd PIP and let 4th digits incision heals - keep cover while open    OT Occupational Profile and History Problem Focused Assessment - Including review of records relating to presenting problem    Occupational performance deficits (Please refer to evaluation for details): ADL's;IADL's;Rest and Sleep;Play;Leisure    Body Structure / Function / Physical Skills ADL;Decreased knowledge of precautions;FMC;Scar mobility;ROM;UE functional use;Flexibility;Sensation;Dexterity;Edema;Skin integrity;Strength;Pain;IADL    Rehab Potential Good    Clinical Decision Making Limited treatment options, no  task modification necessary    Comorbidities Affecting Occupational Performance: May have comorbidities impacting occupational performance    Modification or Assistance to Complete Evaluation  No modification of tasks or assist necessary to complete eval    OT Frequency 2x / week    OT Duration 6 weeks    OT Treatment/Interventions Self-care/ADL training;Paraffin;Fluidtherapy;Contrast Bath;DME and/or AE instruction;Therapeutic exercise;Manual Therapy;Passive range of motion;Scar mobilization;Splinting;Patient/family education    Plan assess progress with HEP    OT Home Exercise Plan see pt insturction    Consulted and Agree with Plan of Care Patient           Patient will benefit from skilled therapeutic intervention in order to improve the following deficits and impairments:   Body Structure / Function / Physical Skills: ADL, Decreased knowledge of precautions, FMC, Scar mobility, ROM, UE functional use, Flexibility, Sensation, Dexterity,  Edema, Skin integrity, Strength, Pain, IADL       Visit Diagnosis: Amputation of digit of right hand, subsequent encounter  Scar condition and fibrosis of skin  Other disturbances of skin sensation  Stiffness of right hand, not elsewhere classified  Stiffness of right wrist, not elsewhere classified  Muscle weakness (generalized)    Problem List Patient Active Problem List   Diagnosis Date Noted  . Lithium toxicity   . Hyponatremia 01/24/2019  . Substance induced mood disorder ([place]) 11/22/2018  . Alcohol abuse with alcohol-induced mood disorder ([place]) 09/21/2018  . Overdose 05/17/2018  . Depression, major, recurrent, severe with psychosis ([place]) 03/30/2018  . Personality disorder ([place]) 12/31/2017  . MDD (major depressive disorder), recurrent episode, severe ([place]) 11/20/2017  . Dysthymia 10/26/2017  . Major depressive disorder, recurrent episode, moderate ([place]) 03/10/2017  . Generalized anxiety disorder 02/05/2017  . Chronic neck pain (Primary Area of Pain) (Left) 11/22/2016  . Cervical facet syndrome (Left) 11/22/2016  . Cervical radiculitis (Left) 11/22/2016  . History of cervical spinal surgery 11/22/2016  . Cervical spondylosis 11/22/2016  . Chronic shoulder pain (Secondary Area of Pain) (Left) 11/22/2016  . Arthropathy of shoulder (Left) 11/22/2016  . Chronic low back pain [facility] Area of Pain) (Bilateral) (L>R) 11/22/2016  . Lumbar facet syndrome (Bilateral) (L>R) 11/22/2016  . Chronic pain syndrome 11/21/2016  . Long term prescription benzodiazepine use 11/21/2016  . Opiate use 11/21/2016  . Type 2 diabetes mellitus without complication, without long-term current use of insulin ([place]) 10/19/2016  . Chronic Suicidal ideation 09/15/2016  . Coronary artery disease 09/12/2016  . HTN (hypertension) 06/26/2016  . GERD (gastroesophageal reflux disease) 06/26/2016  . Alcohol use disorder, severe, 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[place]) 06/26/2016  . Benzodiazepine abuse ([place])  06/11/2016  . Sedative, hypnotic or anxiolytic use disorder, severe, dependence ([place]) 05/12/2016  . Ventricular fibrillation ([place]) 04/05/2016  . Combined hyperlipidemia 01/05/2016  . Cervical spondylosis without myelopathy 07/24/2014  . Severe episode of recurrent major depressive disorder, without psychotic features ([place]) 04/15/2013  . Tobacco use disorder 10/20/2010  . Asthma 10/20/2010    Rosalyn Gess OTR/L CLT 12/12/2019, 3:16 PM  [place] [facility] [address] [place], [address] Phone: [phone]   Fax:  [phone]  Name: Wister Hoefle MRN: [id number] Date of Birth: 30-Sep-1970

## 2019-12-15 ENCOUNTER — Other Ambulatory Visit: Payer: Self-pay

## 2019-12-15 ENCOUNTER — Ambulatory Visit: Payer: Medicare Other | Admitting: Occupational Therapy

## 2019-12-15 DIAGNOSIS — M25641 Stiffness of right hand, not elsewhere classified: Secondary | ICD-10-CM | POA: Diagnosis not present

## 2019-12-15 DIAGNOSIS — S68119D Complete traumatic metacarpophalangeal amputation of unspecified finger, subsequent encounter: Secondary | ICD-10-CM

## 2019-12-15 DIAGNOSIS — M6281 Muscle weakness (generalized): Secondary | ICD-10-CM

## 2019-12-15 DIAGNOSIS — M25631 Stiffness of right wrist, not elsewhere classified: Secondary | ICD-10-CM

## 2019-12-15 DIAGNOSIS — L905 Scar conditions and fibrosis of skin: Secondary | ICD-10-CM

## 2019-12-15 DIAGNOSIS — R208 Other disturbances of skin sensation: Secondary | ICD-10-CM

## 2019-12-15 DIAGNOSIS — R209 Unspecified disturbances of skin sensation: Secondary | ICD-10-CM | POA: Diagnosis not present

## 2019-12-15 NOTE — Therapy (Signed)
[facility] [address], [place], [address] Phone: [phone]   Fax:  [phone]  Occupational Therapy Treatment  Patient Details  Name: Michael Schmitt MRN: [id number] Date of Birth: 1970/12/13 Referring Provider (OT): Dr Ashok Pall   Encounter Date: 12/15/2019   OT End of Session - 12/15/19 0938    Visit Number 5    Number of Visits 12    Date for OT Re-Evaluation 01/01/20    OT Start Time 0901    OT Stop Time 0934    OT Time Calculation (min) 33 min    Activity Tolerance Patient tolerated treatment well    Behavior During Therapy [facility] for tasks assessed/performed           Past Medical History:  Diagnosis Date  . Anxiety   . Anxiety   . Arthritis    cerv. stenosis, spondylosis, HNP- lower back , has been followed in pain clinic, has  had injection s in cerv. area  . Blood dyscrasia    told that when he was young he was a" free bleeder"  . CAD (coronary artery disease)   . Cervical spondylosis without myelopathy 07/24/2014  . Cervicogenic headache 07/24/2014  . Chronic kidney disease    renal calculi- passed spontaneously  . Depression   . Diabetes mellitus without complication ([place])   . Fatty liver   . GERD (gastroesophageal reflux disease)   . Headache(784.0)   . Hyperlipidemia   . Hypertension   . 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 Mental disorder   . MI, old   . RLS (restless legs syndrome)    detected on sleep study  . Shortness of breath   . Ventricular fibrillation ([place]) 04/05/2016    Past Surgical History:  Procedure Laterality Date  . ANTERIOR CERVICAL DECOMP/DISCECTOMY FUSION  11/13/2011   Procedure: ANTERIOR CERVICAL DECOMPRESSION/DISCECTOMY FUSION 1 LEVEL;  Surgeon: [name], MD;  Location: [place] [facility];  Service: Neurosurgery;  Laterality: N/A;  Anterior Cervical Decompression/discectomy Fusion. Cervical three-four.  Marland Kitchen CARDIAC  CATHETERIZATION N/A 01/01/2015   Procedure: Left Heart Cath and Coronary Angiography;  Surgeon: [name], MD;  Location: [place] [facility];  Service: Cardiovascular;  Laterality: N/A;  . CARDIAC CATHETERIZATION N/A 04/05/2016   Procedure: Left Heart Cath and Coronary Angiography;  Surgeon: Belva Crome, MD;  Location: [place] [facility];  Service: Cardiovascular;  Laterality: N/A;  . CARDIAC CATHETERIZATION N/A 04/05/2016   Procedure: Coronary Stent Intervention;  Surgeon: Belva Crome, MD;  Location: [place] [facility];  Service: Cardiovascular;  Laterality: N/A;  . CARDIAC CATHETERIZATION N/A 04/05/2016   Procedure: Intravascular Ultrasound/IVUS;  Surgeon: Belva Crome, MD;  Location: [place] [facility];  Service: Cardiovascular;  Laterality: N/A;  . NASAL SINUS SURGERY     2005    There were no vitals filed for this visit.   Subjective Assessment - 12/15/19 0936    Subjective  I don't know how to contact MD to reschedule appt that I missed last week - my ring finger still open  - I am  cleaning it and putting on neosporin -I really worked hard on the pinkie    Pertinent History Michael Schmitt is a 49 y.o. 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 ED on 10/12/2019 with chills, abdominal pain, and poor PO intake. While admitted, he was evaluated by plastic surgery and underwent partial amputation of right 3rd (at DIP), 4th (at PIP) and fifth (at DIP) fingers on 10/15/19. He presents today for first post-operative evaluation. Patient reports ongoing pain, only managed with oxycodone. He runs out of prescription tomorrow. He was not on any narcotic  pain medication prior to accident.    Patient Stated Goals Want the pain and motion better so I can use my hand again -    Currently in Pain? No/denies              [facility] OT Assessment - 12/15/19 0001      Right Hand AROM   R Index  MCP 0-90 90 Degrees    R Index PIP 0-100 100 Degrees    R Long  MCP 0-90 90 Degrees    R Long PIP 0-100 95 Degrees   -20   R Ring  MCP 0-90 85 Degrees    R Little  MCP 0-90 85 Degrees    R Little PIP 0-100 65 Degrees   -15        IN SESSION THIS DATE IMPROVE TO 75 degrees at PIP of 5th   DId increase with AROM at 5th [facility] and PIP -  But pt's 4th digit incision still open - reporting his cleaning it and putting on neosporin  Bandaid on stump Pt ed on letting it air some when just sitting watching tv  And keep covered when out and about  To contact surgeon schedule appt- he missed last week - info provided          OT Treatments/Exercises (OP) - 12/15/19 0001      Moist Heat Therapy   Number Minutes Moist Heat 5 Minutes    Moist Heat Location Hand   LMB splint for PIP extention of  3rd           cont with  LMB splins for PIP extention  - pt ed on using at home - report his been using it - ask if can use more than 2 x day - pt can use up to 5 x day - but slight pull  And not increase edema   3-5 min at the most and during contrast - when using heat -followed by PROM PIP extention and rolling over foam roller  Cannot soak hand in water - 4th digit incision open this date again - per pt her pulled scab Pt maintaining extention as flexion is increasing   Scar massage to volar wrist scar- done graston tool nr 2 sweeping on volar forearm, palm and volar hand /digitsby OT  Can use silicon digi sleeve for 5th and 3rdat night time-and band aid for 4th   PROM for PIP and MC extention done on table -3 x 30 sec    AAROM for MC flexion 90 degrees- kept 3 x 30 sec PROMflexioncomposite for 2nd , 3rd and 5th to2 cm foam block MC  flexion with PIP extention , blocked intrinsic fist Andcomposite fist to 2cm foam block andno pain AROM to 2 cm foam blockand palm Opposition to all digits - pick up 1 cm foam block Stretch for prayer stretch for wrist extention , flexioncont with  Cont with  light blue putty for gripping , 3 point grip if it do not bother the 4th  Pain free  12 reps  1 x day -REINFORCE not to over do -or squeeze to hard        OT Education - 12/15/19 0938    Education Details HEP changes    Person(s) Educated Patient    Methods Explanation;Demonstration;Tactile cues;Verbal cues;Handout    Comprehension Verbal cues required;Returned demonstration;Verbalized understanding            OT Short Term Goals - 11/20/19 1717      OT SHORT TERM GOAL #1   Title Pt to be independent in HEP to tolerate difference textures on stumps and reaching for objects with R hand    Baseline cannot reach - pain 7/10 when touching hard surface, tolerate light massage but not rougher textures or tapping    Time 4    Period Weeks    Status New    Target Date 12/18/19      OT SHORT TERM GOAL #2   Title R digits AROM improve in [place] and PIP's  flexoin to [facility] to grip untencil,  squeez washcloth    Baseline MC's 50-75 degrees, PIP's 30-90 -    Time 4    Period Weeks    Status New    Target Date 12/18/19             OT Long Term Goals - 11/20/19 1720      OT LONG TERM GOAL #1   Title R hand digits extention increase to WNL to put hand in pocket and glove    Baseline extention MC's -10 to -15, PIP's -20    Time 5    Period Weeks    Status New    Target Date 12/18/19      OT LONG TERM GOAL #2   Title R hand grip strength increase to more than 40% compare to L hand to squeeze washcloth , and hold fishing rod    Baseline NT - 5 wks s/p - still open area on 4th stump    Time 6    Period Weeks    Status New    Target Date 01/01/20      OT LONG TERM GOAL #3   Title Pain on PRHWE in R hand  decrease with more than 20 points    Baseline at eval PRWHE score for pain 38/50    Time 6    Period Weeks    Status New    Target Date 01/01/20                 Plan - 12/15/19 4332    Clinical Impression Statement Pt is about 8 wks s/p partial amputation of 3rd and 5th DIP and 4th at PIP - pt since last time has incision open again on 4th - pulled scabbs off - pt to contact surgeon for follow up - he missed his appt last week -info provided to make appt - pt to keep covered when out and about - and cont to work on PIP extention of 3rd and flexion at 5th    OT Occupational Profile and History Problem Focused Assessment - Including review of records relating to presenting problem    Occupational performance deficits (Please refer to evaluation for details): ADL's;IADL's;Rest and Sleep;Play;Leisure    Body Structure / Function / Physical Skills ADL;Decreased knowledge of precautions;FMC;Scar mobility;ROM;UE functional use;Flexibility;Sensation;Dexterity;Edema;Skin integrity;Strength;Pain;IADL    Rehab Potential Good  Clinical Decision Making Limited treatment options, no task modification necessary    Comorbidities Affecting Occupational Performance: May have comorbidities impacting occupational performance    Modification or Assistance to Complete Evaluation  No modification of tasks or assist necessary to complete eval    OT Frequency 2x / week    OT Duration 6 weeks    OT Treatment/Interventions Self-care/ADL training;Paraffin;Fluidtherapy;Contrast Bath;DME and/or AE instruction;Therapeutic exercise;Manual Therapy;Passive range of motion;Scar mobilization;Splinting;Patient/family education    Plan assess progress with HEP    OT Home Exercise Plan see pt insturction    Consulted and Agree with Plan of Care Patient           Patient will benefit from skilled therapeutic intervention in order to improve the following deficits and impairments:   Body Structure / Function /  Physical Skills: ADL, Decreased knowledge of precautions, FMC, Scar mobility, ROM, UE functional use, Flexibility, Sensation, Dexterity, Edema, Skin integrity, Strength, Pain, IADL       Visit Diagnosis: Amputation of digit of right hand, subsequent encounter  Scar condition and fibrosis of skin  Other disturbances of skin sensation  Stiffness of right hand, not elsewhere classified  Stiffness of right wrist, not elsewhere classified  Muscle weakness (generalized)    Problem List Patient Active Problem List   Diagnosis Date Noted  . Lithium toxicity   . Hyponatremia 01/24/2019  . Substance induced mood disorder ([place]) 11/22/2018  . Alcohol abuse with alcohol-induced mood disorder ([place]) 09/21/2018  . Overdose 05/17/2018  . Depression, major, recurrent, severe with psychosis ([place]) 03/30/2018  . Personality disorder ([place]) 12/31/2017  . MDD (major depressive disorder), recurrent episode, severe ([place]) 11/20/2017  . Dysthymia 10/26/2017  . Major depressive disorder, recurrent episode, moderate ([place]) 03/10/2017  . Generalized anxiety disorder 02/05/2017  . Chronic neck pain (Primary Area of Pain) (Left) 11/22/2016  . Cervical facet syndrome (Left) 11/22/2016  . Cervical radiculitis (Left) 11/22/2016  . History of cervical spinal surgery 11/22/2016  . Cervical spondylosis 11/22/2016  . Chronic shoulder pain (Secondary Area of Pain) (Left) 11/22/2016  . Arthropathy of shoulder (Left) 11/22/2016  . Chronic low back pain [facility] Area of Pain) (Bilateral) (L>R) 11/22/2016  . Lumbar facet syndrome (Bilateral) (L>R) 11/22/2016  . Chronic pain syndrome 11/21/2016  . Long term prescription benzodiazepine use 11/21/2016  . Opiate use 11/21/2016  . Type 2 diabetes mellitus without complication, without long-term current use of insulin ([place]) 10/19/2016  . Chronic Suicidal ideation 09/15/2016  . Coronary artery disease 09/12/2016  . HTN (hypertension) 06/26/2016  . GERD  (gastroesophageal reflux disease) 06/26/2016  . Alcohol use disorder, severe, 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[place]) 06/26/2016  . Benzodiazepine abuse ([place]) 06/11/2016  . Sedative, hypnotic or anxiolytic use disorder, severe, dependence ([place]) 05/12/2016  . Ventricular fibrillation ([place]) 04/05/2016  . Combined hyperlipidemia 01/05/2016  . Cervical spondylosis without myelopathy 07/24/2014  . Severe episode of recurrent major depressive disorder, without psychotic features ([place]) 04/15/2013  . Tobacco use disorder 10/20/2010  . Asthma 10/20/2010    Rosalyn Gess OTR/l,CLT  12/15/2019, 10:10 AM  [place] [facility] [address] [place], [address] Phone: [phone]   Fax:  [phone]  Name: Michael Schmitt MRN: [id number] Date of Birth: [birth year]

## 2019-12-18 ENCOUNTER — Encounter: Payer: Medicare Other | Admitting: Occupational Therapy

## 2019-12-22 ENCOUNTER — Ambulatory Visit: Payer: Medicare Other | Admitting: Occupational Therapy

## 2019-12-22 ENCOUNTER — Other Ambulatory Visit: Payer: Self-pay

## 2019-12-22 DIAGNOSIS — M25641 Stiffness of right hand, not elsewhere classified: Secondary | ICD-10-CM | POA: Diagnosis not present

## 2019-12-22 DIAGNOSIS — R208 Other disturbances of skin sensation: Secondary | ICD-10-CM

## 2019-12-22 DIAGNOSIS — L905 Scar conditions and fibrosis of skin: Secondary | ICD-10-CM

## 2019-12-22 DIAGNOSIS — S68119D Complete traumatic metacarpophalangeal amputation of unspecified finger, subsequent encounter: Secondary | ICD-10-CM

## 2019-12-22 DIAGNOSIS — M6281 Muscle weakness (generalized): Secondary | ICD-10-CM | POA: Diagnosis not present

## 2019-12-22 DIAGNOSIS — M25631 Stiffness of right wrist, not elsewhere classified: Secondary | ICD-10-CM

## 2019-12-22 DIAGNOSIS — R209 Unspecified disturbances of skin sensation: Secondary | ICD-10-CM | POA: Diagnosis not present

## 2019-12-22 NOTE — Therapy (Signed)
[place] [facility] [address] [place], [address] Phone: [phone]   Fax:  [phone]  Occupational Therapy Treatment  Patient Details  Name: Michael Schmitt MRN: [id number] Date of Birth: [birth year] Referring Provider (OT): Dr Ashok Pall   Encounter Date: 12/22/2019   OT End of Session - 12/22/19 1050    Visit Number 6    Number of Visits 12    Date for OT Re-Evaluation 01/01/20    OT Start Time 1010    OT Stop Time 1048    OT Time Calculation (min) 38 min    Activity Tolerance Patient tolerated treatment well    Behavior During Therapy [facility] for tasks assessed/performed           Past Medical History:  Diagnosis Date  . Anxiety   . Anxiety   . Arthritis    cerv. stenosis, spondylosis, HNP- lower back , has been followed in pain clinic, has  had injection s in cerv. area  . Blood dyscrasia    told that when he was young he was a" free bleeder"  . CAD (coronary artery disease)   . Cervical spondylosis without myelopathy 07/24/2014  . Cervicogenic headache 07/24/2014  . Chronic kidney disease    renal calculi- passed spontaneously  . Depression   . Diabetes mellitus without complication ([place])   . Fatty liver   . GERD (gastroesophageal reflux disease)   . Headache(784.0)   . Hyperlipidemia   . Hypertension   . 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 Mental disorder   . MI, old   . RLS (restless legs syndrome)    detected on sleep study  . Shortness of breath   . Ventricular fibrillation ([place]) 04/05/2016    Past Surgical History:  Procedure Laterality Date  . ANTERIOR CERVICAL DECOMP/DISCECTOMY FUSION  11/13/2011   Procedure: ANTERIOR CERVICAL DECOMPRESSION/DISCECTOMY FUSION 1 LEVEL;  Surgeon: [name], MD;  Location: [place] NEURO ORS;  Service: Neurosurgery;  Laterality: N/A;  Anterior Cervical Decompression/discectomy Fusion. Cervical three-four.  Marland Kitchen CARDIAC  CATHETERIZATION N/A 01/01/2015   Procedure: Left Heart Cath and Coronary Angiography;  Surgeon: [name], MD;  Location: [place] CV LAB;  Service: Cardiovascular;  Laterality: N/A;  . CARDIAC CATHETERIZATION N/A 04/05/2016   Procedure: Left Heart Cath and Coronary Angiography;  Surgeon: Belva Crome, MD;  Location: [place] CV LAB;  Service: Cardiovascular;  Laterality: N/A;  . CARDIAC CATHETERIZATION N/A 04/05/2016   Procedure: Coronary Stent Intervention;  Surgeon: Belva Crome, MD;  Location: [place] CV LAB;  Service: Cardiovascular;  Laterality: N/A;  . CARDIAC CATHETERIZATION N/A 04/05/2016   Procedure: Intravascular Ultrasound/IVUS;  Surgeon: Belva Crome, MD;  Location: [place] CV LAB;  Service: Cardiovascular;  Laterality: N/A;  . NASAL SINUS SURGERY     2005    There were no vitals filed for this visit.   Subjective Assessment - 12/22/19 1048    Subjective  I can push up with my hand , grip object - I used new skin on that ring finger- it wanted to open like you told me - I think it looks better- I don't have ride to the surgeon    Pertinent History Michael Schmitt is a 49 y.o. 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 While admitted, he was evaluated by plastic surgery and underwent partial amputation of right 3rd (at DIP), 4th (at PIP) and fifth (at DIP) fingers on 10/15/19. He presents today for first post-operative evaluation. Patient reports ongoing pain, only managed with oxycodone. He runs out of prescription tomorrow. He was not on any narcotic pain  medication prior to accident.    Patient Stated Goals Want the pain and motion better so I can use my hand again -    Currently in Pain? Yes    Pain Score 2     Pain Location Hand    Pain Orientation Right    Pain Descriptors / Indicators Aching;Tightness    Pain Type Surgical pain    Pain Onset More than a month ago    Pain Frequency Intermittent    Aggravating Factors  making fist - PIP of 3rd and 5th              [facility] OT Assessment - 12/22/19 0001      Strength   Right Hand Grip (lbs) 41    Right Hand Lateral Pinch 28 lbs    Right Hand 3 Point Pinch 20 lbs    Left Hand Grip (lbs) 88    Left Hand Lateral Pinch 30 lbs    Left Hand 3 Point Pinch 28 lbs      Right Hand AROM   R Index  MCP 0-90 90 Degrees    R Index PIP 0-100 100 Degrees    R Long  MCP 0-90 90 Degrees    R Long PIP 0-100 95 Degrees   -12 lateral, flexin in session 95   R Ring  MCP 0-90 85 Degrees   90 in session   R Little  MCP 0-90 --   85 in session   R Little PIP 0-100 --   75 in session , PROM 80          Pt flexion at about the same coming in -and extention staying steady - on lateral side measured for PIP extention - less than -15 degrees But show increase AROM in session   great progress in grip and prehension strength - see flow sheet        OT Treatments/Exercises (OP) - 12/22/19 0001      Moist Heat Therapy   Number Minutes Moist Heat 5 Minutes    Moist Heat Location Hand   prior to PROM and soft tissue            cont with LMB splinsfor PIP extention at home on 5th and 3rd    report his been using it at home - ask if can use more than 2 x day - pt can use up to 5 x day - but slight pull  And not increase edema 3rd digit incision looking better than last time - healing - and closing up - pt says he is using new skin   per pt missed his appt and cannot reschedule - he do not have transportation to [employer]   Scar massage to volar wrist scar- done graston tool nr 2  sweeping on volar forearm, and volar 3rd and 5th digit -prior to ROM  Issued new digi sleeves for 5th  and 3rdat night time-and as needed during day  PROM for PIP  on table -3 x 30 sec    AAROM for MC flexion 90 degrees- kept 3 x 30 sec PROMflexioncomposite for  3rd and 5th 2 x 2 min  Composite fist - flexion place and hold  Upgrade to teal medium putty -  Gripping without 2nd digit - focus on 3rd and 5th,  3 point grip, and then 2 point using 3rd and 5th with thumb - pain free  And only 12 reps  Pain free  2 x day -REINFORCE not to over do -or squeeze  Pt to focus on having 4th digit incision heal         OT Education - 12/22/19 1050    Education Details progress and HEP changes    Person(s) Educated Patient    Methods Explanation;Demonstration;Tactile cues;Verbal cues;Handout    Comprehension Verbal cues required;Returned demonstration;Verbalized understanding            OT Short Term Goals - 12/22/19 1051      OT SHORT TERM GOAL #1   Title Pt to be independent in HEP to tolerate difference textures on stumps and reaching for objects with R hand    Baseline cannot reach - pain 7/10 when touching hard surface, tolerate light massage but not rougher textures or tapping - only pain with bumping into hard surface - ring finger still partially open again    Time 4    Period Weeks    Status On-going    Target Date 01/05/20      OT SHORT TERM GOAL #2   Title R digits AROM improve in [place] and PIP's  flexoin to [facility] to grip untencil,  squeez washcloth    Baseline MC's 50-75 degrees, PIP's 30-90 - great progress but need more flexion at PIP of 3rd and 5th - squeez washcloth    Time 4    Period Weeks    Status On-going    Target Date 01/05/20             OT Long Term Goals - 12/22/19 1053      OT LONG TERM GOAL #1   Title R hand digits extention increase to WNL to put hand in pocket and glove    Baseline MC's WNL , PIP -15 at 3rd and 5th -5    Time 4    Period  Weeks    Status On-going    Target Date 01/05/20      OT LONG TERM GOAL #2   Title R hand grip strength increase to more than 40% compare to L hand to squeeze washcloth , and hold fishing rod    Baseline Grip strength R 41 lbs ,L 88 lbs    Time 4    Period Weeks    Status On-going    Target Date 01/01/20      OT LONG TERM GOAL #3   Title Pain on PRHWE in R hand decrease with more than 20 points    Baseline at eval PRWHE score for pain 38/50 - improve to 16/50    Status Achieved                 Plan - 12/22/19 1051    OT Occupational Profile and History Problem Focused Assessment - Including review of records relating to presenting problem    Occupational performance deficits (Please refer to evaluation for details): ADL's;IADL's;Rest and Sleep;Play;Leisure    Body Structure / Function /  Physical Skills ADL;Decreased knowledge of precautions;FMC;Scar mobility;ROM;UE functional use;Flexibility;Sensation;Dexterity;Edema;Skin integrity;Strength;Pain;IADL    Rehab Potential Good    Clinical Decision Making Limited treatment options, no task modification necessary    Comorbidities Affecting Occupational Performance: May have comorbidities impacting occupational performance    Modification or Assistance to Complete Evaluation  No modification of tasks or assist necessary to complete eval    OT Frequency 1x / week    OT Duration 4 weeks    OT Treatment/Interventions Self-care/ADL training;Paraffin;Fluidtherapy;Contrast Bath;DME and/or AE instruction;Therapeutic exercise;Manual Therapy;Passive range of motion;Scar mobilization;Splinting;Patient/family education    Plan assess progress with HEP    OT Home Exercise Plan see pt instruction    Consulted and Agree with Plan of Care Patient           Patient will benefit from skilled therapeutic intervention in order to improve the following deficits and impairments:   Body Structure / Function / Physical Skills: ADL, Decreased  knowledge of precautions, FMC, Scar mobility, ROM, UE functional use, Flexibility, Sensation, Dexterity, Edema, Skin integrity, Strength, Pain, IADL       Visit Diagnosis: Amputation of digit of right hand, subsequent encounter  Scar condition and fibrosis of skin  Other disturbances of skin sensation  Stiffness of right hand, not elsewhere classified  Stiffness of right wrist, not elsewhere classified  Muscle weakness (generalized)    Problem List Patient Active Problem List   Diagnosis Date Noted  . Lithium toxicity   . Hyponatremia 01/24/2019  . Substance induced mood disorder ([place]) 11/22/2018  . Alcohol abuse with alcohol-induced mood disorder ([place]) 09/21/2018  . Overdose 05/17/2018  . Depression, major, recurrent, severe with psychosis ([place]) 03/30/2018  . Personality disorder ([place]) 12/31/2017  . MDD (major depressive disorder), recurrent episode, severe ([place]) 11/20/2017  . Dysthymia 10/26/2017  . Major depressive disorder, recurrent episode, moderate ([place]) 03/10/2017  . Generalized anxiety disorder 02/05/2017  . Chronic neck pain (Primary Area of Pain) (Left) 11/22/2016  . Cervical facet syndrome (Left) 11/22/2016  . Cervical radiculitis (Left) 11/22/2016  . History of cervical spinal surgery 11/22/2016  . Cervical spondylosis 11/22/2016  . Chronic shoulder pain (Secondary Area of Pain) (Left) 11/22/2016  . Arthropathy of shoulder (Left) 11/22/2016  . Chronic low back pain [facility] Area of Pain) (Bilateral) (L>R) 11/22/2016  . Lumbar facet syndrome (Bilateral) (L>R) 11/22/2016  . Chronic pain syndrome 11/21/2016  . Long term prescription benzodiazepine use 11/21/2016  . Opiate use 11/21/2016  . Type 2 diabetes mellitus without complication, without long-term current use of insulin ([place]) 10/19/2016  . Chronic Suicidal ideation 09/15/2016  . Coronary artery disease 09/12/2016  . HTN (hypertension) 06/26/2016  . GERD (gastroesophageal reflux disease) 06/26/2016   . Alcohol use disorder, severe, 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[place]) 06/26/2016  . Benzodiazepine abuse ([place]) 06/11/2016  . Sedative, hypnotic or anxiolytic use disorder, severe, dependence ([place]) 05/12/2016  . Ventricular fibrillation ([place]) 04/05/2016  . Combined hyperlipidemia 01/05/2016  . Cervical spondylosis without myelopathy 07/24/2014  . Severe episode of recurrent major depressive disorder, without psychotic features ([place]) 04/15/2013  . Tobacco use disorder 10/20/2010  . Asthma 10/20/2010    Rosalyn Gess OTR/L,CLT 12/22/2019, 11:30 AM  [place] [facility] [address] [place], [address] Phone: [phone]   Fax:  [phone]  Name: Michael Schmitt MRN: [id number] Date of Birth: 1971-02-03

## 2019-12-26 ENCOUNTER — Emergency Department
Admission: EM | Admit: 2019-12-26 | Discharge: 2019-12-27 | Disposition: A | Discharge status: Home / Self Care | Payer: Medicare Other | Attending: Student in an Organized Health Care Education/Training Program | Admitting: Student in an Organized Health Care Education/Training Program

## 2019-12-26 ENCOUNTER — Ambulatory Visit: Payer: Medicare Other | Admitting: Occupational Therapy

## 2019-12-26 DIAGNOSIS — Z0279 Encounter for issue of other medical certificate: Secondary | ICD-10-CM | POA: Diagnosis present

## 2019-12-26 DIAGNOSIS — Z955 Presence of coronary angioplasty implant and graft: Secondary | ICD-10-CM | POA: Diagnosis not present

## 2019-12-26 DIAGNOSIS — Z20822 Contact with and (suspected) exposure to covid-19: Secondary | ICD-10-CM | POA: Insufficient documentation

## 2019-12-26 DIAGNOSIS — F39 Unspecified mood [affective] disorder: Secondary | ICD-10-CM | POA: Diagnosis present

## 2019-12-26 DIAGNOSIS — F17210 Nicotine dependence, cigarettes, uncomplicated: Secondary | ICD-10-CM | POA: Insufficient documentation

## 2019-12-26 DIAGNOSIS — F1910 Other psychoactive substance abuse, uncomplicated: Secondary | ICD-10-CM | POA: Insufficient documentation

## 2019-12-26 DIAGNOSIS — E119 Type 2 diabetes mellitus without complications: Secondary | ICD-10-CM | POA: Diagnosis not present

## 2019-12-26 DIAGNOSIS — Z7982 Long term (current) use of aspirin: Secondary | ICD-10-CM | POA: Insufficient documentation

## 2019-12-26 DIAGNOSIS — Z7984 Long term (current) use of oral hypoglycemic drugs: Secondary | ICD-10-CM | POA: Diagnosis not present

## 2019-12-26 DIAGNOSIS — Z79899 Other long term (current) drug therapy: Secondary | ICD-10-CM | POA: Insufficient documentation

## 2019-12-26 DIAGNOSIS — F332 Major depressive disorder, recurrent severe without psychotic features: Secondary | ICD-10-CM | POA: Diagnosis not present

## 2019-12-26 DIAGNOSIS — I129 Hypertensive chronic kidney disease with stage 1 through stage 4 chronic kidney disease, or unspecified chronic kidney disease: Secondary | ICD-10-CM | POA: Diagnosis not present

## 2019-12-26 DIAGNOSIS — N189 Chronic kidney disease, unspecified: Secondary | ICD-10-CM | POA: Diagnosis not present

## 2019-12-26 DIAGNOSIS — I2510 Atherosclerotic heart disease of native coronary artery without angina pectoris: Secondary | ICD-10-CM | POA: Insufficient documentation

## 2019-12-26 DIAGNOSIS — R45851 Suicidal ideations: Secondary | ICD-10-CM | POA: Diagnosis not present

## 2019-12-26 DIAGNOSIS — F1994 Other psychoactive substance use, unspecified with psychoactive substance-induced mood disorder: Secondary | ICD-10-CM | POA: Diagnosis present

## 2019-12-26 DIAGNOSIS — F12988 Cannabis use, unspecified with other cannabis-induced disorder: Secondary | ICD-10-CM | POA: Diagnosis present

## 2019-12-26 LAB — URINE DRUG SCREEN, QUALITATIVE (ARMC ONLY)
Amphetamines, Ur Screen: NOT DETECTED
Barbiturates, Ur Screen: NOT DETECTED
Benzodiazepine, Ur Scrn: NOT DETECTED
Cannabinoid 50 Ng, Ur ~~LOC~~: POSITIVE — AB
Cocaine Metabolite,Ur ~~LOC~~: NOT DETECTED
MDMA (Ecstasy)Ur Screen: NOT DETECTED
Methadone Scn, Ur: NOT DETECTED
Opiate, Ur Screen: NOT DETECTED
Phencyclidine (PCP) Ur S: NOT DETECTED
Tricyclic, Ur Screen: NOT DETECTED

## 2019-12-26 LAB — RESPIRATORY PANEL BY RT PCR (FLU A&B, COVID)
Influenza A by PCR: NEGATIVE
Influenza B by PCR: NEGATIVE
SARS Coronavirus 2 by RT PCR: NEGATIVE

## 2019-12-26 MED ORDER — CLONAZEPAM 0.5 MG PO TABS
2.0000 mg | ORAL_TABLET | Freq: Once | ORAL | Status: AC
  Administered 2019-12-26: 2 mg via ORAL
  Filled 2019-12-26: qty 4

## 2019-12-26 NOTE — ED Notes (Signed)
Pt changed into behavorial clothing by this Probation officer, items placed in a labeled belongings bag and placed at [employer], the following items obtained are as follows....  1 paid of jeans, 1 pair of blue shoes, 1 red shirt, 1 yellow colored necklace

## 2019-12-26 NOTE — ED Notes (Signed)
Patient continues to ask if he is going to "see a psychiatrist tonight or what". Again explained to patient he will have to see TTS.

## 2019-12-26 NOTE — ED Notes (Signed)
Patient tolerated po meds without incident. Now asking to be discharged.

## 2019-12-26 NOTE — ED Notes (Signed)
Pharmacy tech called to reconcile meds.

## 2019-12-26 NOTE — ED Notes (Signed)
Patient asking when psychiatry is going to see him. Advised the patient he would be seen as soon as Dr. Janese Banks could come to see him, I had no precise time.

## 2019-12-26 NOTE — BH Assessment (Signed)
Assessment Note  Michael Schmitt is an 49 y.o. male who presents to the ER due to having increase thoughts of ending his life. He also reports of having A/H due to recent cannabis use. He further reports he has dealt with depression for majority of life and currently having trouble managing it. When asked if he had a plan, he initially stated he didn't and then said, "Well, I can do it many different ways." He currently receives CST Pension scheme manager) with [facility]. He has an upcoming appointment with a psychiatrist. This will be the initial appointment to start medication management.  During the interview the patient was calm, cooperative and pleasant. He was able to provide appropriate answers to the questions. He denies HI and AV/H. He also denies involvement with the legal system.  Diagnosis: Major Depression  Past Medical History:  Past Medical History:  Diagnosis Date   Anxiety    Anxiety    Arthritis    cerv. stenosis, spondylosis, HNP- lower back , has been followed in pain clinic, has  had injection s in cerv. 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   Mental disorder    MI, old    RLS (restless legs syndrome)    detected on sleep study   Shortness of breath    Ventricular fibrillation ([place]) 04/05/2016    Past Surgical History:  Procedure Laterality Date   ANTERIOR CERVICAL DECOMP/DISCECTOMY FUSION  11/13/2011   Procedure: ANTERIOR CERVICAL DECOMPRESSION/DISCECTOMY FUSION 1  LEVEL;  Surgeon: [name], MD;  Location: [place] NEURO ORS;  Service: Neurosurgery;  Laterality: N/A;  Anterior Cervical Decompression/discectomy Fusion. Cervical three-four.   CARDIAC CATHETERIZATION N/A 01/01/2015   Procedure: Left Heart Cath and Coronary Angiography;  Surgeon: [name], MD;  Location: [place] CV LAB;  Service: Cardiovascular;  Laterality: N/A;   CARDIAC CATHETERIZATION N/A 04/05/2016   Procedure: Left Heart Cath and Coronary Angiography;  Surgeon: Belva Crome, MD;  Location: [place] CV LAB;  Service: Cardiovascular;  Laterality: N/A;   CARDIAC CATHETERIZATION N/A 04/05/2016   Procedure: Coronary Stent Intervention;  Surgeon: Belva Crome, MD;  Location: [place] CV LAB;  Service: Cardiovascular;  Laterality: N/A;   CARDIAC CATHETERIZATION N/A 04/05/2016   Procedure: Intravascular Ultrasound/IVUS;  Surgeon: Belva Crome, MD;  Location: [place] CV LAB;  Service: Cardiovascular;  Laterality: N/A;   NASAL SINUS SURGERY     2005    Family History:  Family History  Problem Relation Age of Onset   Prostate cancer Father    Hypertension Mother    Kidney Stones Mother    Anxiety disorder Mother    Depression Mother    COPD Sister    Hypertension Sister    Diabetes Sister    Depression Sister    Anxiety disorder Sister    Seizures Sister    ADD / ADHD Son    ADD / ADHD Daughter  Social History:  reports that he has been smoking cigarettes and cigars. He has a 34.00 pack-year smoking history. He has quit using smokeless tobacco. He reports that he does not drink alcohol and does not use drugs.  Additional Social History:  Alcohol / Drug Use Pain Medications: See PTA Prescriptions: See PTA Over the Counter: See PTA  CIWA: CIWA-Ar BP: (!) 148/97 Pulse Rate: (!) 104 COWS:    Allergies:  Allergies  Allergen Reactions   Prednisone Other (See Comments)    Hypertension and makes him feel "spacey"   Amoxicillin Other (See  Comments)    Other Reaction: ELEVATED BP   Hydrocodone Other (See Comments)    Causes headaches and makes the patient irritable   Varenicline Other (See Comments)    Suicidal thoughts   Trazodone And Nefazodone Anxiety and Other (See Comments)    Patient stated that this keeps him awake; "has an opposite effect on me"    Home Medications: (Not in a hospital admission)   OB/GYN Status:  No LMP for male patient.  General Assessment Data Location of Assessment: [facility] ED TTS Assessment: In system Is this a Tele or Face-to-Face Assessment?: Face-to-Face Is this an Initial Assessment or a Re-assessment for this encounter?: Initial Assessment Patient Accompanied by:: N/A Language Other than English: No Living Arrangements: Other (Comment) (Private Home) What gender do you identify as?: Male Date Telepsych consult ordered in [facility]: 12/26/19 Time Telepsych consult ordered in [facility]: 1245 Marital status: Single Pregnancy Status: No Living Arrangements: Parent Can pt return to current living arrangement?: Yes Admission Status: Involuntary Petitioner: ED Attending Is patient capable of signing voluntary admission?: No (Under IVC) Referral Source: Self/Family/Friend Insurance type: None  Medical Screening Exam [facility] Walk-in ONLY) Medical Exam completed: Yes  Crisis Care Plan Living Arrangements: Parent Legal Guardian: Other: (Self) Name of Psychiatrist: [facility] Name of Therapist: [facility]  Education Status Is patient currently in school?: No Is the patient employed, unemployed or receiving disability?: Unemployed, Receiving disability income  Risk to self with the past 6 months Suicidal Ideation: Yes-Currently Present Has patient been a risk to self within the past 6 months prior to admission? : Yes Suicidal Intent: No Has patient had any suicidal intent within the past 6 months prior to admission? : No Is patient at risk for suicide?: No Suicidal Plan?: No Has patient had any suicidal  plan within the past 6 months prior to admission? : No Access to Means: No What has been your use of drugs/alcohol within the last 12 months?: Alcohol & Cannabis Previous Attempts/Gestures: Yes How many times?: 0 Other Self Harm Risks: Alcohol Abuse Triggers for Past Attempts: Other personal contacts, Other (Comment) Intentional Self Injurious Behavior: None Family Suicide History: Unknown Recent stressful life event(s): Other (Comment) Persecutory voices/beliefs?: No Depression: Yes Depression Symptoms: Isolating, Guilt, Feeling worthless/self pity Substance abuse history and/or treatment for substance abuse?: Yes Suicide prevention information given to non-admitted patients: Not applicable  Risk to Others within the past 6 months Homicidal Ideation: No Does patient have any lifetime risk of violence toward others beyond the six months prior to admission? : No Thoughts of Harm to Others: No Current Homicidal Intent: No Current Homicidal Plan: No Access to Homicidal Means: No Identified Victim: Reports of none  History of harm to others?: No Assessment of Violence: None Noted Violent Behavior Description: Reports of none Does patient have access to weapons?: No Criminal Charges Pending?: No Does patient have a court date: No Is patient on probation?: No  Psychosis Hallucinations: None noted Delusions: None noted  Mental Status Report Appearance/Hygiene: Unremarkable, In scrubs Eye Contact: Fair Motor Activity: Freedom of movement, Unremarkable Speech: Logical/coherent, Unremarkable Level of Consciousness: Alert Mood: Depressed, Helpless, Sad, Pleasant Affect: Appropriate to circumstance, Anxious, Depressed Anxiety Level: Minimal Thought Processes: Coherent, Relevant Judgement: Unimpaired Orientation: Person, Place, Time, Situation, Appropriate for developmental age Obsessive Compulsive Thoughts/Behaviors: None  Cognitive Functioning Concentration: Normal Memory:  Recent Intact, Remote Intact Is patient IDD: No Insight: Fair Impulse Control: Fair Appetite: Good Have you had any weight changes? : No Change Sleep: Decreased (Trouble falling and staying asleep) Total Hours of Sleep: 4 Vegetative Symptoms: None  ADLScreening [facility] Assessment Services) Patient's cognitive ability adequate to safely complete daily activities?: Yes Patient able to express need for assistance with ADLs?: Yes Independently performs ADLs?: Yes (appropriate for developmental age)  Prior Inpatient Therapy Prior Inpatient Therapy: Yes Prior Therapy Dates: Multiple Hospitalizations Prior Therapy Facilty/Provider(s): Multiple Hospitalizations Reason for Treatment: Depression & Alcohol  Prior Outpatient Therapy Prior Outpatient Therapy: Yes Prior Therapy Dates: Current Prior Therapy Facilty/Provider(s): [facility] Reason for Treatment: Mood Disorder Does patient have an ACCT team?: No Does patient have Intensive In-House Services?  : No Does patient have [facility] services? : No Does patient have P4CC services?: No  ADL Screening (condition at time of admission) Patient's cognitive ability adequate to safely complete daily activities?: Yes Is the patient deaf or have difficulty hearing?: No Does the patient have difficulty seeing, even when wearing glasses/contacts?: No Does the patient have difficulty concentrating, remembering, or making decisions?: No Patient able to express need for assistance with ADLs?: Yes Does the patient have difficulty dressing or bathing?: No Independently performs ADLs?: Yes (appropriate for developmental age) Does the patient have difficulty walking or climbing stairs?: No Weakness of Legs: None Weakness of Arms/Hands: None  Home Assistive Devices/Equipment Home Assistive Devices/Equipment: None  Therapy Consults (therapy consults require a physician order) PT Evaluation Needed: No OT Evalulation Needed: No SLP Evaluation Needed:  No Abuse/Neglect Assessment (Assessment to be complete while patient is alone) Abuse/Neglect Assessment Can Be Completed: Yes Physical Abuse: Denies Verbal Abuse: Denies Sexual Abuse: Denies Exploitation of patient/patient's resources: Denies Self-Neglect: Denies Values / Beliefs Cultural Requests During Hospitalization: None Spiritual Requests During Hospitalization: None Consults Spiritual Care Consult Needed: No Transition of Care Team Consult Needed: No  Disposition:  Disposition Initial Assessment Completed for this Encounter: Yes  On Site Evaluation by:   Reviewed with Physician:    Gunnar Fusi MS, LCAS, [facility], [place] Therapeutic Triage Specialist 12/26/2019 5:22 PM

## 2019-12-26 NOTE — ED Notes (Signed)
Pt requested and provided with water

## 2019-12-26 NOTE — ED Notes (Signed)
INVOLUNTARY with all papers on chart, nurse, officer all informed of IVC, awaiting TTS/PSYCH consult

## 2019-12-26 NOTE — ED Provider Notes (Signed)
[facility] Emergency Department Provider Note    First MD Initiated Contact with Patient 12/26/19 1252     (approximate)  I have reviewed the triage vital signs and the nursing notes.   HISTORY  Chief Complaint Medical Clearance    HPI Ford Peddie is a 49 y.o. male below listed past medical history presents to the ER under IVC via [place].  Patient wanted his department and reportedly stating that he is suicidal today with plan to hit himself with a hammer until he is dead.  Does admit to smoking delta 8.   Past Medical History:  Diagnosis Date  . Anxiety   . Anxiety   . Arthritis    cerv. stenosis, spondylosis, HNP- lower back , has been followed in pain clinic, has  had injection s in cerv. area  . Blood dyscrasia    told that when he was young he was a" free bleeder"  . CAD (coronary artery disease)   . Cervical spondylosis without myelopathy 07/24/2014  . Cervicogenic headache 07/24/2014  . Chronic kidney disease    renal calculi- passed spontaneously  . Depression   . Diabetes mellitus without complication ([place])   . Fatty liver   . GERD (gastroesophageal reflux disease)   . Headache(784.0)   . Hyperlipidemia   . Hypertension   . 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 Mental disorder   . MI, old   . RLS (restless legs syndrome)    detected on sleep study  . Shortness of breath   . Ventricular fibrillation ([place]) 04/05/2016   Family History  Problem Relation Age of Onset  . Prostate cancer Father   . Hypertension Mother   . Kidney Stones Mother   . Anxiety disorder Mother   . Depression Mother   . COPD Sister   . Hypertension Sister   . Diabetes Sister   . Depression Sister   . Anxiety disorder Sister   . Seizures Sister   . ADD / ADHD Son   . ADD / ADHD Daughter    Past Surgical History:  Procedure Laterality Date  . ANTERIOR CERVICAL DECOMP/DISCECTOMY  FUSION  11/13/2011   Procedure: ANTERIOR CERVICAL DECOMPRESSION/DISCECTOMY FUSION 1 LEVEL;  Surgeon: [name], MD;  Location: [place] [facility];  Service: Neurosurgery;  Laterality: N/A;  Anterior Cervical Decompression/discectomy Fusion. Cervical three-four.  Marland Kitchen CARDIAC CATHETERIZATION N/A 01/01/2015   Procedure: Left Heart Cath and Coronary Angiography;  Surgeon: [name], MD;  Location: [place] CV LAB;  Service: Cardiovascular;  Laterality: N/A;  . CARDIAC CATHETERIZATION N/A 04/05/2016   Procedure: Left Heart Cath and Coronary Angiography;  Surgeon: Belva Crome, MD;  Location: [place] CV LAB;  Service: Cardiovascular;  Laterality: N/A;  . CARDIAC CATHETERIZATION N/A 04/05/2016   Procedure: Coronary Stent Intervention;  Surgeon: Belva Crome, MD;  Location: [place] CV LAB;  Service: Cardiovascular;  Laterality: N/A;  . CARDIAC CATHETERIZATION N/A 04/05/2016   Procedure: Intravascular Ultrasound/IVUS;  Surgeon: Belva Crome, MD;  Location: [place] CV LAB;  Service: Cardiovascular;  Laterality: N/A;  . NASAL SINUS SURGERY     2005   Patient Active Problem List   Diagnosis Date Noted  . Lithium toxicity   . Hyponatremia 01/24/2019  . Substance induced mood disorder ([place]) 11/22/2018  . Alcohol abuse with alcohol-induced mood disorder ([place]) 09/21/2018  . Overdose 05/17/2018  . Depression, major, recurrent, severe  with psychosis ([place]) 03/30/2018  . Personality disorder ([place]) 12/31/2017  . MDD (major depressive disorder), recurrent episode, severe ([place]) 11/20/2017  . Dysthymia 10/26/2017  . Major depressive disorder, recurrent episode, moderate ([place]) 03/10/2017  . Generalized anxiety disorder 02/05/2017  . Chronic neck pain (Primary Area of Pain) (Left) 11/22/2016  . Cervical facet syndrome (Left) 11/22/2016  . Cervical radiculitis (Left) 11/22/2016  . History of cervical spinal surgery 11/22/2016  . Cervical spondylosis 11/22/2016  . Chronic shoulder pain (Secondary Area  of Pain) (Left) 11/22/2016  . Arthropathy of shoulder (Left) 11/22/2016  . Chronic low back pain [facility] Area of Pain) (Bilateral) (L>R) 11/22/2016  . Lumbar facet syndrome (Bilateral) (L>R) 11/22/2016  . Chronic pain syndrome 11/21/2016  . Long term prescription benzodiazepine use 11/21/2016  . Opiate use 11/21/2016  . Type 2 diabetes mellitus without complication, without long-term current use of insulin ([place]) 10/19/2016  . Chronic Suicidal ideation 09/15/2016  . Coronary artery disease 09/12/2016  . HTN (hypertension) 06/26/2016  . GERD (gastroesophageal reflux disease) 06/26/2016  . Alcohol use disorder, severe, 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[place]) 06/26/2016  . Benzodiazepine abuse ([place]) 06/11/2016  . Sedative, hypnotic or anxiolytic use disorder, severe, dependence ([place]) 05/12/2016  . Ventricular fibrillation ([place]) 04/05/2016  . Combined hyperlipidemia 01/05/2016  . Cervical spondylosis without myelopathy 07/24/2014  . Severe episode of recurrent major depressive disorder, without psychotic features ([place]) 04/15/2013  . Tobacco use disorder 10/20/2010  . Asthma 10/20/2010      Prior to Admission medications   Medication Sig Start Date End Date Taking? Authorizing Provider  ARIPiprazole (ABILIFY) 15 MG tablet Take 15 mg by mouth daily. 10/14/18   [provider]  aspirin 81 MG EC tablet Take 1 tablet (81 mg total) by mouth daily. For heart health Patient not taking: Reported on 01/24/2019 11/21/17   Lindell Spar I, NP  carvedilol (COREG) 12.5 MG tablet Take 12.5 mg by mouth 2 (two) times daily with a meal. 10/25/18 10/25/19  [provider]  chlorproMAZINE (THORAZINE) 200 MG tablet Take 200 mg by mouth 3 (three) times daily.    [provider]  clonazePAM (KLONOPIN) 1 MG tablet Take 1 mg by mouth 2 (two) times daily.    [provider]  DULoxetine (CYMBALTA) 20 MG capsule Take 20 mg by mouth daily. 08/05/18   [provider]  escitalopram (LEXAPRO)  20 MG tablet Take 20 mg by mouth daily.    [provider]  ibuprofen (ADVIL) 600 MG tablet Take 1 tablet (600 mg total) by mouth every 6 (six) hours as needed. 02/02/19   Laban Emperor, PA-C  lamoTRIgine (LAMICTAL) 150 MG tablet Take 150 mg by mouth daily. 07/17/18   [provider]  lidocaine (LIDODERM) 5 % Place 1 patch onto the skin daily. Remove & Discard patch within 12 hours or as directed by MD 02/02/19   Laban Emperor, PA-C  loperamide (IMODIUM) 2 MG capsule Take 1 capsule (2 mg total) by mouth as needed for diarrhea or loose stools. 07/01/19   Blake Divine, MD  metFORMIN (GLUCOPHAGE) 1000 MG tablet Take 1,000 mg by mouth 2 (two) times daily. 10/02/18   [provider]  mirtazapine (REMERON) 30 MG tablet Take 30 mg by mouth Nightly. Patient not taking: Reported on 11/20/2019 10/14/18   [provider]  mirtazapine (REMERON) 45 MG tablet Take 45 mg by mouth at bedtime.    [provider]  nicotine (NICODERM CQ - DOSED IN MG/24 HOURS) 21 mg/24hr patch 21 mg chest  wall daily (okay to substitute generic) 01/26/19   Loletha Grayer, MD  nitroGLYCERIN (NITROSTAT) 0.4 MG SL tablet Place 0.4 mg under the tongue as needed. 06/03/18   [provider]  ondansetron (ZOFRAN ODT) 4 MG disintegrating tablet Take 1 tablet (4 mg total) by mouth every 8 (eight) hours as needed for nausea or vomiting. 07/01/19   Blake Divine, MD  QUEtiapine (SEROQUEL) 100 MG tablet Take 1 tablet (100 mg total) by mouth at bedtime. 01/26/19   Wieting, Richard, MD  REXULTI 3 MG TABS Take 3 mg by mouth daily. 09/03/18   [provider]  sildenafil (REVATIO) 20 MG tablet Take 1 tablet (20 mg total) by mouth daily as needed. 10/15/18   Billey Co, MD    Allergies Prednisone, Amoxicillin, Hydrocodone, Varenicline, and Trazodone and nefazodone    Social History Social History   Tobacco Use  . Smoking status: Current Every Day Smoker    Packs/day: 2.00    Years:  17.00    Pack years: 34.00    Types: Cigarettes, Cigars  . Smokeless tobacco: Former Systems developer  . Tobacco comment: occassional snuff   Vaping Use  . Vaping Use: Never used  Substance Use Topics  . Alcohol use: No    Alcohol/week: 0.0 standard drinks    Comment: last drinked in 3 months.   . Drug use: No    Review of Systems Patient denies headaches, rhinorrhea, blurry vision, numbness, shortness of breath, chest pain, edema, cough, abdominal pain, nausea, vomiting, diarrhea, dysuria, fevers, rashes or hallucinations unless otherwise stated above in HPI. ____________________________________________   PHYSICAL EXAM:  VITAL SIGNS: Vitals:   12/26/19 1257 12/26/19 1259  BP:  (!) 148/97  Pulse:  (!) 104  Resp:  18  Temp: 98.4 F (36.9 C) 98.7 F (37.1 C)  SpO2:  98%    Constitutional: Alert able to ambulate with a steady gait Eyes: Conjunctivae are normal.  Head: Atraumatic. Nose: No congestion/rhinnorhea. Mouth/Throat: Mucous membranes are moist.   Neck: No stridor. Painless ROM.  Cardiovascular: Normal rate, regular rhythm. Grossly normal heart sounds.  Good peripheral circulation. Respiratory: Normal respiratory effort.  No retractions. Lungs CTAB. Gastrointestinal: Soft and nontender. No distention. No abdominal bruits. No CVA tenderness. Genitourinary: deferred Musculoskeletal: No lower extremity tenderness nor edema.  No joint effusions. Neurologic:  Normal speech and language. No gross focal neurologic deficits are appreciated. No facial droop Skin:  Skin is warm, dry and intact. No rash noted. Psychiatric: anxiety, frequently screaming help, is redirectable ____________________________________________   LABS (all labs ordered are listed, but only abnormal results are displayed)  No results found for this or any previous visit (from the past 24  hour(s)). ____________________________________________ ____________________________________________  [id number]   ____________________________________________   PROCEDURES  Procedure(s) performed:  Procedures    Critical Care performed: no ____________________________________________   INITIAL IMPRESSION / ASSESSMENT AND PLAN / ED COURSE  Pertinent labs & imaging results that were available during my care of the patient were reviewed by me and considered in my medical decision making (see chart for details).   DDX: Psychosis, delirium, medication effect, noncompliance, polysubstance abuse, Si, Hi, depression   Windell Arleigh Odowd is a 49 y.o. who presents to the ED with suicidal ideation under IVC by [facility] department.  Patient well-known to this facility.  Appears at his baseline.  Unfortunate does appear the influence of some likely this substance "delta 8 "that he admits to smoking today I think that is causing his agitation.  As he  arrives under IVC with his psychiatric history will continue IVC for observation and psychiatric evaluation. Suspect that this is substance abuse related.  Patient otherwise appears at his baseline.  Denies any other complaints at this time.     The patient was evaluated in Emergency Department today for the symptoms described in the history of present illness. He/she was evaluated in the context of the global COVID-19 pandemic, which necessitated consideration that the patient might be at risk for infection with the SARS-CoV-2 virus that causes COVID-19. Institutional protocols and algorithms that pertain to the evaluation of patients at risk for COVID-19 are in a state of rapid change based on information released by regulatory bodies including the CDC and federal and state organizations. These policies and algorithms were followed during the patient's care in the ED.  As part of my medical decision making, I reviewed the following data within the  [place] notes reviewed and incorporated, Labs reviewed, notes from prior ED visits and [place] Controlled Substance Database   ____________________________________________   FINAL CLINICAL IMPRESSION(S) / ED DIAGNOSES  Final diagnoses:  Polysubstance abuse ([place])      NEW MEDICATIONS STARTED DURING THIS VISIT:  New Prescriptions   No medications on file     Note:  This document was prepared using Dragon voice recognition software and may include unintentional dictation errors.    Merlyn Lot, MD 12/26/19 1349

## 2019-12-26 NOTE — ED Triage Notes (Signed)
Patient to ED via [facility] for suicidal ideation. States he has been smoking Delta 8. Wants something to calm him down specifically Ativan. MD over to evaluate patient during triage process. Patient starts screaming at the top of his lungs. Patient states he does it because "I'm suicidal." Has plans to hit himself in the head with a hammer until he dies. Denies alcohol use or illicit drug use in the form of cocaine or meth.

## 2019-12-26 NOTE — ED Notes (Signed)
Patient again asking about when he will see a psychiatrist. Explained to patient he may not see one tonight. Is again asking to use the phone.

## 2019-12-27 DIAGNOSIS — F39 Unspecified mood [affective] disorder: Secondary | ICD-10-CM | POA: Diagnosis not present

## 2019-12-27 DIAGNOSIS — F332 Major depressive disorder, recurrent severe without psychotic features: Secondary | ICD-10-CM | POA: Diagnosis not present

## 2019-12-27 DIAGNOSIS — F12988 Cannabis use, unspecified with other cannabis-induced disorder: Secondary | ICD-10-CM

## 2019-12-27 LAB — GLUCOSE, CAPILLARY: Glucose-Capillary: 152 mg/dL — ABNORMAL HIGH (ref 70–99)

## 2019-12-27 NOTE — ED Notes (Signed)
Hourly rounding reveals patient sitting in hall bed. No complaints, stable, in no acute distress. Q15 minute rounds and monitoring via Engineer, drilling to continue.

## 2019-12-27 NOTE — Consult Note (Signed)
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  He was walking in the hallway and wanted to leave. Reported using Delta 8 last night and had suicidal ideations, positive for meth.  Denies any side effects today.  No suicidal/homicidal ideations, hallucinations.  Scheduled for a psychiatrist appointment at [facility] on Tuesday.  He has CST and will start IOP soon.  Psychiatrically stable for discharge.  Total Time spent with patient: 45 minutes  Past Psychiatric History: anxiety, substance use d/o, depression  Past Medical History:  Past Medical History:  Diagnosis Date  . Anxiety   . Anxiety   . Arthritis    cerv. stenosis, spondylosis, HNP- lower back , has been followed in pain clinic, has  had injection s in cerv. area  . Blood dyscrasia    told that when he was young he was a" free bleeder"  . CAD (coronary artery disease)   . Cervical spondylosis without myelopathy 07/24/2014  . Cervicogenic headache 07/24/2014  . Chronic kidney disease    renal calculi- passed spontaneously  . Depression   . Diabetes mellitus without complication ([place])   . Fatty liver   . GERD (gastroesophageal reflux disease)   . Headache(784.0)   . Hyperlipidemia   . Hypertension   . 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 Mental disorder   . MI, old   . RLS (restless legs syndrome)    detected on sleep study  . Shortness of breath   . Ventricular fibrillation ([place]) 04/05/2016    Past Surgical History:  Procedure Laterality Date  . ANTERIOR CERVICAL DECOMP/DISCECTOMY FUSION  11/13/2011   Procedure: ANTERIOR CERVICAL DECOMPRESSION/DISCECTOMY  FUSION 1 LEVEL;  Surgeon: [name], MD;  Location: [place] [facility];  Service: Neurosurgery;  Laterality: N/A;  Anterior Cervical Decompression/discectomy Fusion. Cervical three-four.  Marland Kitchen CARDIAC CATHETERIZATION N/A 01/01/2015   Procedure: Left Heart Cath and Coronary Angiography;  Surgeon: [name], MD;  Location: [place] CV LAB;  Service: Cardiovascular;  Laterality: N/A;  . CARDIAC CATHETERIZATION N/A 04/05/2016   Procedure: Left Heart Cath and Coronary Angiography;  Surgeon: Belva Crome, MD;  Location: [place] CV LAB;  Service: Cardiovascular;  Laterality: N/A;  . CARDIAC CATHETERIZATION N/A 04/05/2016   Procedure: Coronary Stent Intervention;  Surgeon: Belva Crome, MD;  Location: [place] CV LAB;  Service: Cardiovascular;  Laterality: N/A;  . CARDIAC CATHETERIZATION N/A 04/05/2016   Procedure: Intravascular Ultrasound/IVUS;  Surgeon: Belva Crome, MD;  Location: [place] CV LAB;  Service: Cardiovascular;  Laterality: N/A;  . NASAL SINUS SURGERY     2005   Family History:  Family History  Problem Relation Age of Onset  . Prostate cancer Father   . Hypertension Mother   . Kidney Stones Mother   . Anxiety disorder Mother   . Depression Mother   . COPD Sister   . Hypertension Sister   . Diabetes Sister   . Depression Sister   . Anxiety disorder Sister   . Seizures Sister   . ADD / ADHD Son   . ADD / ADHD Daughter    Family Psychiatric  History: see above Social History:  Social History   Substance and Sexual Activity  Alcohol Use No  . Alcohol/week: 0.0 standard drinks   Comment: last drinked in 3 months.      Social History   Substance and Sexual Activity  Drug Use No    Social History   Socioeconomic History  . Marital status: Single    Spouse name: Not on file  . Number of children: 3  . Years of education: 44  . Highest education level: Not on file  Occupational History    Comment: unemployed  Tobacco Use  . Smoking status: Current Every  Day Smoker    Packs/day: 2.00    Years: 17.00    Pack years: 34.00    Types: Cigarettes, Cigars  . Smokeless tobacco: Former Systems developer  . Tobacco comment: occassional snuff   Vaping Use  . Vaping Use: Never used  Substance and Sexual Activity  . Alcohol use: No    Alcohol/week: 0.0 standard drinks    Comment: last drinked in 3 months.   . Drug use: No  . Sexual activity: Not Currently  Other Topics Concern  . Not on file  Social History Narrative   Patient is right handed.   Patient drinks 2 sodas daily.   Social Determinants of Health   Financial Resource Strain:   . Difficulty of Paying Living Expenses: Not on file  Food Insecurity:   . Worried About Charity fundraiser in the Last Year: Not on file  . Ran Out of Food in the Last Year: Not on file  Transportation Needs:   . Lack of Transportation (Medical): Not on file  . Lack of Transportation (Non-Medical): Not on file  Physical Activity:   . Days of Exercise per Week: Not on file  . Minutes of Exercise per Session: Not on file  Stress:   . Feeling of Stress : Not on file  Social Connections:   . Frequency of Communication with Friends and Family: Not on file  . Frequency of Social Gatherings with Friends and Family: Not on file  . Attends Religious Services: Not on file  . Active Member of Clubs or Organizations: Not on file  . Attends Archivist Meetings: Not on file  . Marital Status: Not on file    Has this patient used any form of tobacco in the last 30 days? (Cigarettes, Smokeless Tobacco, Cigars, and/or Pipes) A prescription for an FDA-approved tobacco cessation medication was offered at discharge and the patient refused  Current Medications: No current facility-administered medications for this encounter.   Current Outpatient Medications  Medication Sig Dispense Refill  . aspirin 81 MG EC tablet Take 1 tablet (81 mg total) by mouth daily. For heart health 30 tablet 1  . atorvastatin (LIPITOR) 10  MG tablet Take 10 mg by mouth daily.    . carvedilol (COREG) 12.5 MG tablet Take 12.5 mg by mouth 2 (two) times daily with a meal.    . chlorproMAZINE (THORAZINE) 200 MG tablet Take 600 mg by mouth at bedtime.    . clopidogrel (PLAVIX) 75 MG tablet Take 75 mg by mouth daily.    Marland Kitchen escitalopram (LEXAPRO) 20 MG tablet Take 20 mg by mouth daily.    Marland Kitchen lisinopril (ZESTRIL) 10 MG tablet Take 10 mg by mouth daily.    . metFORMIN (GLUCOPHAGE) 1000 MG tablet Take 1,000 mg by mouth 2 (two) times daily.    . mirtazapine (REMERON) 45 MG tablet Take 45 mg by mouth at bedtime.  PTA Medications: (Not in a hospital admission)   Musculoskeletal: Strength & Muscle Tone: within normal limits Gait & Station: normal Patient leans: N/A  Psychiatric Specialty Exam: Physical Exam Vitals and nursing note reviewed.  Constitutional:      Appearance: Normal appearance.  HENT:     Head: Normocephalic.     Nose: Nose normal.  Pulmonary:     Effort: Pulmonary effort is normal.  Musculoskeletal:        General: Normal range of motion.     Cervical back: Normal range of motion.  Neurological:     General: No focal deficit present.     Mental Status: He is alert and oriented to person, place, and time.  Psychiatric:        Attention and Perception: Attention and perception normal.        Mood and Affect: Affect normal. Mood is anxious.        Speech: Speech normal.        Behavior: Behavior normal. Behavior is cooperative.        Thought Content: Thought content normal.        Cognition and Memory: Cognition and memory normal.        Judgment: Judgment normal.     Review of Systems  Psychiatric/Behavioral: The patient is nervous/anxious.   All other systems reviewed and are negative.   Blood pressure (!) 130/93, pulse 90, temperature 97.8 F (36.6 C), temperature source Oral, resp. rate 14, height 6\' 2"  (1.88 m), weight 88.5 kg, SpO2 100 %.Body mass index is 25.04 kg/m.  General Appearance: Casual   Eye Contact:  Good  Speech:  Normal Rate  Volume:  Normal  Mood:  Anxious  Affect:  Congruent  Thought Process:  Coherent and Descriptions of Associations: Intact  Orientation:  Full (Time, Place, and Person)  Thought Content:  WDL and Logical  Suicidal Thoughts:  No  Homicidal Thoughts:  No  Memory:  Immediate;   Good Recent;   Good Remote;   Good  Judgement:  Fair  Insight:  Fair  Psychomotor Activity:  Normal  Concentration:  Concentration: Good and Attention Span: Good  Recall:  Good  Fund of Knowledge:  Good  Language:  Good  Akathisia:  No  Handed:  Right  AIMS (if indicated):     Assets:  Housing Leisure Time Physical Health Resilience Social Support  ADL's:  Intact  Cognition:  WNL  Sleep:        Demographic Factors:  Male and Caucasian  Loss Factors: NA  Historical Factors: NA  Risk Reduction Factors:   Sense of responsibility to family, Living with another person, especially a relative, Positive social support and Positive therapeutic relationship  Continued Clinical Symptoms:  Anxiety, mild  Cognitive Features That Contribute To Risk:  None    Suicide Risk:  Minimal: No identifiable suicidal ideation.  Patients presenting with no risk factors but with morbid ruminations; may be classified as minimal risk based on the severity of the depressive symptoms    Plan Of Care/Follow-up recommendations:  Cannabis induced mood disorder: -Follow up with [facility] and IOP along with CST team  Insomnia: -Continue Remeron 45 mg  Mood d/o: -Continue Thorazine 600 mg at bedtime  Depression: -Continue Lexapro 20 mg daily Activity:  as tolerated Diet:  heart healthy diet  Disposition: discharge home Waylan Boga, NP 12/27/2019, 2:13 PM

## 2019-12-27 NOTE — Consult Note (Addendum)
[facility] Psychiatry Consult   Reason for Consult:  Psych evaluation  Referring Physician:  Dr. Joni Fears Patient Identification: Michael Schmitt MRN:  [id number] Principal Diagnosis: <principal problem not specified> Diagnosis:  Active Problems:   * No active hospital problems. *   Total Time spent with patient: 45 minutes    HPI:  Michael Schmitt is brought in IVC per er nurse Patient to ED via [facility] for suicidal ideation. States he has been smoking Delta 8. Wants something to calm him down specifically Ativan. MD over to evaluate patient during triage process. Patient starts screaming at the top of his lungs. Patient states he does it because "I'm suicidal." Has plans to hit himself in the head with a hammer until he dies. Denies alcohol use or illicit drug use in the form of cocaine or meth.  Per TTS: Michael Schmitt is an 49 y.o. male who presents to the ER due to having increase thoughts of ending his life. He also reports of having A/H due to recent cannabis use. He further reports he has dealt with depression for majority of life and currently having trouble managing it. When asked if he had a plan, he initially stated he didn't and then said, "Well, I can do it many different ways." He currently receives CST Pension scheme manager) with [facility]. He has an upcoming appointment with a psychiatrist. This will be the initial appointment to start medication management.  During the interview the patient was calm, cooperative and pleasant. He was able to provide appropriate answers to the questions. He denies SI, HI and AV/H. He states that he wants to go home and that he was intoxicated when he made the statements.  He is agreeable to being reassessed this am.   Past Psychiatric History: Depression, bipolar   Risk to Self: Suicidal Ideation: Yes-Currently Present Suicidal Intent: No Is patient at risk for suicide?: No Suicidal Plan?: No Access to Means: No What has been  your use of drugs/alcohol within the last 12 months?: Alcohol & Cannabis How many times?: 0 Other Self Harm Risks: Alcohol Abuse Triggers for Past Attempts: Other personal contacts, Other (Comment) Intentional Self Injurious Behavior: None Risk to Others: Homicidal Ideation: No Thoughts of Harm to Others: No Current Homicidal Intent: No Current Homicidal Plan: No Access to Homicidal Means: No Identified Victim: Reports of none  History of harm to others?: No Assessment of Violence: None Noted Violent Behavior Description: Reports of none Does patient have access to weapons?: No Criminal Charges Pending?: No Does patient have a court date: No Prior Inpatient Therapy: Prior Inpatient Therapy: Yes Prior Therapy Dates: Multiple Hospitalizations Prior Therapy Facilty/Provider(s): Multiple Hospitalizations Reason for Treatment: Depression & Alcohol Prior Outpatient Therapy: Prior Outpatient Therapy: Yes Prior Therapy Dates: Current Prior Therapy Facilty/Provider(s): [facility] Reason for Treatment: Mood Disorder Does patient have an ACCT team?: No Does patient have Intensive In-House Services?  : No Does patient have [facility] services? : No Does patient have P4CC services?: No  Past Medical History:  Past Medical History:  Diagnosis Date  . Anxiety   . Anxiety   . Arthritis    cerv. stenosis, spondylosis, HNP- lower back , has been followed in pain clinic, has  had injection s in cerv. area  . Blood dyscrasia    told that when he was young he was a" free bleeder"  . CAD (coronary artery disease)   . Cervical spondylosis without myelopathy 07/24/2014  . Cervicogenic headache 07/24/2014  . Chronic kidney disease  renal calculi- passed spontaneously  . Depression   . Diabetes mellitus without complication ([place])   . Fatty liver   . GERD (gastroesophageal reflux disease)   . Headache(784.0)   . Hyperlipidemia   . Hypertension   . 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 Mental disorder   . MI, old   . RLS (restless legs syndrome)    detected on sleep study  . Shortness of breath   . Ventricular fibrillation ([place]) 04/05/2016    Past Surgical History:  Procedure Laterality Date  . ANTERIOR CERVICAL DECOMP/DISCECTOMY FUSION  11/13/2011   Procedure: ANTERIOR CERVICAL DECOMPRESSION/DISCECTOMY FUSION 1 LEVEL;  Surgeon: [name], MD;  Location: [facility];  Service: Neurosurgery;  Laterality: N/A;  Anterior Cervical Decompression/discectomy Fusion. Cervical three-four.  Marland Kitchen CARDIAC CATHETERIZATION N/A 01/01/2015   Procedure: Left Heart Cath and Coronary Angiography;  Surgeon: [name], MD;  Location: [facility];  Service: Cardiovascular;  Laterality: N/A;  . CARDIAC CATHETERIZATION N/A 04/05/2016   Procedure: Left Heart Cath and Coronary Angiography;  Surgeon: Belva Crome, MD;  Location: [facility];  Service: Cardiovascular;  Laterality: N/A;  . CARDIAC CATHETERIZATION N/A 04/05/2016   Procedure: Coronary Stent Intervention;  Surgeon: Belva Crome, MD;  Location: [facility];  Service: Cardiovascular;  Laterality: N/A;  . CARDIAC CATHETERIZATION N/A 04/05/2016   Procedure: Intravascular Ultrasound/IVUS;  Surgeon: Belva Crome, MD;  Location: [facility];  Service: Cardiovascular;  Laterality: N/A;  . NASAL SINUS SURGERY     2005   Family History:  Family History  Problem Relation Age of Onset  . Prostate cancer Father   . Hypertension Mother   . Kidney Stones Mother   . Anxiety disorder Mother   . Depression Mother   . COPD Sister   . Hypertension Sister   . Diabetes Sister   . Depression Sister   . Anxiety disorder Sister   . Seizures Sister   . ADD / ADHD Son   . ADD / ADHD Daughter    Family Psychiatric  History: unknown Social History:  Social History   Substance and Sexual Activity  Alcohol Use No  . Alcohol/week: 0.0 standard drinks   Comment: last drinked in 3 months.       Social History   Substance and Sexual Activity  Drug Use No    Social History   Socioeconomic History  . Marital status: Single    Spouse name: Not on file  . Number of children: 3  . Years of education: 30  . Highest education level: Not on file  Occupational History    Comment: unemployed  Tobacco Use  . Smoking status: Current Every Day Smoker    Packs/day: 2.00    Years: 17.00    Pack years: 34.00    Types: Cigarettes, Cigars  . Smokeless tobacco: Former Systems developer  . Tobacco comment: occassional snuff   Vaping Use  . Vaping Use: Never used  Substance and Sexual Activity  . Alcohol use: No    Alcohol/week: 0.0 standard drinks    Comment: last drinked in 3 months.   . Drug use: No  . Sexual activity: Not Currently  Other Topics Concern  . Not on file  Social History Narrative   Patient is right handed.   Patient drinks 2 sodas daily.   Social Determinants of Health   Financial Resource Strain:   .  Difficulty of Paying Living Expenses: Not on file  Food Insecurity:   . Worried About Charity fundraiser in the Last Year: Not on file  . Ran Out of Food in the Last Year: Not on file  Transportation Needs:   . Lack of Transportation (Medical): Not on file  . Lack of Transportation (Non-Medical): Not on file  Physical Activity:   . Days of Exercise per Week: Not on file  . Minutes of Exercise per Session: Not on file  Stress:   . Feeling of Stress : Not on file  Social Connections:   . Frequency of Communication with Friends and Family: Not on file  . Frequency of Social Gatherings with Friends and Family: Not on file  . Attends Religious Services: Not on file  . Active Member of Clubs or Organizations: Not on file  . Attends Archivist Meetings: Not on file  . Marital Status: Not on file   Additional Social History:    Allergies:   Allergies  Allergen Reactions  . Prednisone Other (See Comments)    Hypertension and makes him feel "spacey"  .  Amoxicillin Other (See Comments)    Other Reaction: ELEVATED BP  . Hydrocodone Other (See Comments)    Causes headaches and makes the patient irritable  . Varenicline Other (See Comments)    Suicidal thoughts  . Trazodone And Nefazodone Anxiety and Other (See Comments)    Patient stated that this keeps him awake; "has an opposite effect on me"    Labs:  Results for orders placed or performed during the hospital encounter of 12/26/19 (from the past 48 hour(s))  Urine Drug Screen, Qualitative ([facility] only)     Status: Abnormal   Collection Time: 12/26/19  1:28 PM  Result Value Ref Range   Tricyclic, Ur Screen NONE DETECTED NONE DETECTED   Amphetamines, Ur Screen NONE DETECTED NONE DETECTED   MDMA (Ecstasy)Ur Screen NONE DETECTED NONE DETECTED   Cocaine Metabolite,Ur [place] NONE DETECTED NONE DETECTED   Opiate, Ur Screen NONE DETECTED NONE DETECTED   Phencyclidine (PCP) Ur S NONE DETECTED NONE DETECTED   Cannabinoid 50 Ng, Ur [place] POSITIVE (A) NONE DETECTED   Barbiturates, Ur Screen NONE DETECTED NONE DETECTED   Benzodiazepine, Ur Scrn NONE DETECTED NONE DETECTED   Methadone Scn, Ur NONE DETECTED NONE DETECTED    Comment: (NOTE) Tricyclics + metabolites, urine    Cutoff 1000 ng/mL Amphetamines + metabolites, urine  Cutoff 1000 ng/mL MDMA (Ecstasy), urine              Cutoff 500 ng/mL Cocaine Metabolite, urine          Cutoff 300 ng/mL Opiate + metabolites, urine        Cutoff 300 ng/mL Phencyclidine (PCP), urine         Cutoff 25 ng/mL Cannabinoid, urine                 Cutoff 50 ng/mL Barbiturates + metabolites, urine  Cutoff 200 ng/mL Benzodiazepine, urine              Cutoff 200 ng/mL Methadone, urine                   Cutoff 300 ng/mL  The urine drug screen provides only a preliminary, unconfirmed analytical test result and should not be used for non-medical purposes. Clinical consideration and professional judgment should be applied to any positive drug screen result due to  possible interfering substances. A more specific  alternate chemical method must be used in order to obtain a confirmed analytical result. Gas chromatography / mass spectrometry (GC/MS) is the preferred confirm atory method. Performed at [facility], [address] [place], [place] [address]   Respiratory Panel by RT PCR (Flu A&B, Covid) - Nasopharyngeal Swab     Status: None   Collection Time: 12/26/19  8:04 PM   Specimen: Nasopharyngeal Swab  Result Value Ref Range   SARS Coronavirus 2 by RT PCR NEGATIVE NEGATIVE    Comment: (NOTE) SARS-CoV-2 target nucleic acids are NOT DETECTED.  The SARS-CoV-2 RNA is generally detectable in upper respiratoy specimens during the acute phase of infection. The lowest concentration of SARS-CoV-2 viral copies this assay can detect is 131 copies/mL. A negative result does not preclude SARS-Cov-2 infection and should not be used as the sole basis for treatment or other patient management decisions. A negative result may occur with  improper specimen collection/handling, submission of specimen other than nasopharyngeal swab, presence of viral mutation(s) within the areas targeted by this assay, and inadequate number of viral copies (<131 copies/mL). A negative result must be combined with clinical observations, patient history, and epidemiological information. The expected result is Negative.  Fact Sheet for Patients:  PinkCheek.be  Fact Sheet for Healthcare Providers:  GravelBags.it  This test is no t yet approved or cleared by the [place] FDA and  has been authorized for detection and/or diagnosis of SARS-CoV-2 by FDA under an Emergency Use Authorization (EUA). This EUA will remain  in effect (meaning this test can be used) for the duration of the COVID-19 declaration under Section 564(b)(1) of the Act, 21 U.S.C. section 360bbb-3(b)(1), unless the authorization is  terminated or revoked sooner.     Influenza A by PCR NEGATIVE NEGATIVE   Influenza B by PCR NEGATIVE NEGATIVE    Comment: (NOTE) The Xpert Xpress SARS-CoV-2/FLU/RSV assay is intended as an aid in  the diagnosis of influenza from Nasopharyngeal swab specimens and  should not be used as a sole basis for treatment. Nasal washings and  aspirates are unacceptable for Xpert Xpress SARS-CoV-2/FLU/RSV  testing.  Fact Sheet for Patients: PinkCheek.be  Fact Sheet for Healthcare Providers: GravelBags.it  This test is not yet approved or cleared by the [place] FDA and  has been authorized for detection and/or diagnosis of SARS-CoV-2 by  FDA under an Emergency Use Authorization (EUA). This EUA will remain  in effect (meaning this test can be used) for the duration of the  Covid-19 declaration under Section 564(b)(1) of the Act, 21  U.S.C. section 360bbb-3(b)(1), unless the authorization is  terminated or revoked. Performed at [facility], [address] [place], [place] [address]     No current facility-administered medications for this encounter.   Current Outpatient Medications  Medication Sig Dispense Refill  . ARIPiprazole (ABILIFY) 15 MG tablet Take 15 mg by mouth daily.    Marland Kitchen aspirin 81 MG EC tablet Take 1 tablet (81 mg total) by mouth daily. For heart health (Patient not taking: Reported on 01/24/2019) 30 tablet 1  . carvedilol (COREG) 12.5 MG tablet Take 12.5 mg by mouth 2 (two) times daily with a meal.    . chlorproMAZINE (THORAZINE) 200 MG tablet Take 200 mg by mouth 3 (three) times daily.    . clonazePAM (KLONOPIN) 1 MG tablet Take 1 mg by mouth 2 (two) times daily.    . DULoxetine (CYMBALTA) 20 MG capsule Take 20 mg by mouth daily.    Marland Kitchen  escitalopram (LEXAPRO) 20 MG tablet Take 20 mg by mouth daily.    Marland Kitchen ibuprofen (ADVIL) 600 MG tablet Take 1 tablet (600 mg total) by mouth every 6 (six) hours as needed.  30 tablet 0  . lamoTRIgine (LAMICTAL) 150 MG tablet Take 150 mg by mouth daily.    Marland Kitchen lidocaine (LIDODERM) 5 % Place 1 patch onto the skin daily. Remove & Discard patch within 12 hours or as directed by MD 30 patch 0  . loperamide (IMODIUM) 2 MG capsule Take 1 capsule (2 mg total) by mouth as needed for diarrhea or loose stools. 15 capsule 0  . metFORMIN (GLUCOPHAGE) 1000 MG tablet Take 1,000 mg by mouth 2 (two) times daily.    . mirtazapine (REMERON) 30 MG tablet Take 30 mg by mouth Nightly. (Patient not taking: Reported on 11/20/2019)    . mirtazapine (REMERON) 45 MG tablet Take 45 mg by mouth at bedtime.    . nicotine (NICODERM CQ - DOSED IN MG/24 HOURS) 21 mg/24hr patch 21 mg chest wall daily (okay to substitute generic) 28 patch 0  . nitroGLYCERIN (NITROSTAT) 0.4 MG SL tablet Place 0.4 mg under the tongue as needed.    . ondansetron (ZOFRAN ODT) 4 MG disintegrating tablet Take 1 tablet (4 mg total) by mouth every 8 (eight) hours as needed for nausea or vomiting. 12 tablet 0  . QUEtiapine (SEROQUEL) 100 MG tablet Take 1 tablet (100 mg total) by mouth at bedtime. 30 tablet 0  . REXULTI 3 MG TABS Take 3 mg by mouth daily.    . sildenafil (REVATIO) 20 MG tablet Take 1 tablet (20 mg total) by mouth daily as needed. 30 tablet 0    Musculoskeletal: Strength & Muscle Tone: within normal limits Gait & Station: normal Patient leans: N/A  Psychiatric Specialty Exam: Physical Exam Vitals and nursing note reviewed.  Constitutional:      Appearance: Normal appearance.  HENT:     Head: Normocephalic and atraumatic.     Nose: Nose normal.  Eyes:     Pupils: Pupils are equal, round, and reactive to light.  Cardiovascular:     Rate and Rhythm: Normal rate.  Pulmonary:     Effort: Pulmonary effort is normal.     Breath sounds: Normal breath sounds.  Musculoskeletal:        General: Normal range of motion.     Cervical back: Normal range of motion.  Skin:    General: Skin is warm and dry.   Neurological:     Mental Status: He is alert.  Psychiatric:        Attention and Perception: Attention normal.        Mood and Affect: Mood normal.        Speech: Speech normal.        Behavior: Behavior normal. Behavior is cooperative.        Thought Content: Thought content normal.        Cognition and Memory: Cognition and memory normal.        Judgment: Judgment normal.     Review of Systems  Psychiatric/Behavioral: Positive for agitation. Negative for hallucinations, sleep disturbance and suicidal ideas.  All other systems reviewed and are negative.   Blood pressure 127/80, pulse 90, temperature 98 F (36.7 C), temperature source Oral, resp. rate 18, height 6\' 2"  (1.88 m), weight 88.5 kg, SpO2 99 %.Body mass index is 25.04 kg/m.  General Appearance: Casual  Eye Contact:  Fair  Speech:  Clear and Coherent  Volume:  Normal  Mood:  Euphoric  Affect:  Congruent  Thought Process:  Coherent and Descriptions of Associations: Intact  Orientation:  Full (Time, Place, and Person)  Thought Content:  WDL  Suicidal Thoughts:  No  Homicidal Thoughts:  No  Memory:  Immediate;   Fair  Judgement:  Fair  Insight:  Fair  Psychomotor Activity:  Normal  Concentration:  Concentration: Fair  Recall:  Good  Fund of Knowledge:  Fair  Language:  Fair  Akathisia:  Yes  Handed:  Right  AIMS (if indicated):     Assets:  Communication Skills Desire for Improvement Social Support  ADL's:  Intact  Cognition:  WNL  Sleep:        Treatment Plan Summary: Reassessment in the am  Disposition: No evidence of imminent risk to self or others at present.   reassess in the am  Deloria Lair, NP 12/27/2019 1:15 AM

## 2019-12-27 NOTE — ED Notes (Addendum)
Hourly rounding reveals patient pacing in hall. No complaints, stable, in no acute distress. Q15 minute rounds and monitoring via Engineer, drilling to continue.

## 2019-12-27 NOTE — ED Notes (Signed)
Signature pad unavailable.  Discharge paperwork was reviewed with the patient; patient verbalized understanding with no questions.

## 2019-12-27 NOTE — ED Notes (Signed)
Sister called Olin Hauser [phone] pronounced "Sonta").  Pt is on the phone with her at this time, and pt gave permission for staff to share information with her in the future.

## 2019-12-27 NOTE — Discharge Instructions (Signed)
Substance Use Disorder Substance use disorder occurs when a person's repeated use of drugs or alcohol interferes with his or her ability to be productive. This disorder can cause problems with mental and physical health. It can affect your ability to have healthy relationships, and it can keep you from being able to meet your responsibilities at work, home, or school. It can also lead to addiction, which is a condition in which the person cannot stop using the substance consistently for a period of time. Addiction changes the way the brain works. Because of these changes, addiction is a chronic condition. Substance use disorder can be mild, moderate, or severe. The most commonly abused substances include:  Alcohol.  Tobacco.  Marijuana.  Stimulants, such as cocaine and methamphetamine.  Hallucinogens, such as LSD and PCP.  Opioids, such as some prescription pain medicines and heroin. What are the causes? This condition may develop due to many complex social, psychological, or physical reasons, such as:  Stress.  Abuse.  Peer pressure.  Anxiety or depression. What increases the risk? This condition is more likely to develop in people who:  Use substances to cope with stress.  Have been abused.  Have a mental health disorder, such as depression.  Have a family history of substance use disorder. What are the signs or symptoms? Symptoms of this condition include:  Using the substance for longer periods of time or at a higher dosage than what is normal or intended.  Having a lasting desire to use the substance.  Being unable to slow down or stop the use of the substance.  Spending an abnormal amount of time getting the substance, using the substance, or recovering from using the substance.  Using the substance in a way that interferes with work, school, social activities, and personal relationships.  Using the substance even after having negative consequences, such  as: ? Health problems. ? Legal or financial troubles. ? Job loss. ? Relationship problems.  Needing more and more of the substance to get the same effect (developing tolerance).  Experiencing unpleasant symptoms if you do not use the substance (withdrawal).  Using the substance to avoid withdrawal symptoms. How is this diagnosed? This condition may be diagnosed based on:  A physical exam.  Your history of substance use.  Your symptoms. This includes: ? How substance use affects your life. ? Changes in personality, behaviors, and mood. ? Having at least two symptoms of substance use disorder within a 8-month period. ? Health issues related to substance use, such as liver damage, shortness of breath, fatigue, cough, or heart problems.  Blood or urine tests to screen for alcohol and drugs. How is this treated? This condition may be treated by:  Stopping substance use safely. This may require taking medicines and being closely monitored for several days.  Taking part in group and individual counseling from mental health providers who help people with substance use disorder.  Staying at a live-in (residential) treatment center for several days or weeks.  Attending daily counseling sessions at a treatment center.  Taking medicine as told by your health care provider: ? To ease symptoms and prevent complications during withdrawal. ? To treat other mental health issues, such as depression or anxiety. ? To block cravings by causing the same effects as the substance. ? To block the effects of the substance or replace good sensations with unpleasant ones.  Participating in a support group to share your experience with others who are going through the same thing. These  groups are an important part of long-term recovery for many people. Recovery can be a long process. Many people who undergo treatment start using the substance again after stopping (relapse). If you relapse, that does  not mean that treatment will not work. Follow these instructions at home:   Take over-the-counter and prescription medicines only as told by your health care provider.  Do not use any drugs or alcohol.  Avoid temptations or triggers that you associate with your use of the substance.  Learn and practice techniques for managing stress.  Have a plan for vulnerable moments. Get phone numbers of people who are willing to help and who are committed to your recovery.  Attend support groups on a regular basis. These groups include 12-step programs like Alcoholics Anonymous and Narcotics Anonymous.  Keep all follow-up visits as told by your health care providers. This is important. This includes continuing to work with therapists and support groups. Contact a health care provider if:  You cannot take your medicines as told.  Your symptoms get worse.  You have trouble resisting the urge to use drugs or alcohol. Get help right away if you:  Relapse.  Think that you may have taken too much of a drug. The hotline of the [facility] is 7472488980.  Have signs of an overdose. Symptoms include: ? Chest pain. ? Confusion. ? Sleepiness or difficulty staying awake. ? Slowed breathing. ? Nausea or vomiting. ? A seizure.  Have serious thoughts about hurting yourself or someone else. Drug overdose is an emergency. Do not wait to see if the symptoms will go away. Get medical help right away. Call your local emergency services (911 in the [place] Do not drive yourself to the hospital. If you ever feel like you may hurt yourself or others, or have thoughts about taking your own life, get help right away. You can go to your nearest emergency department or call:  Your local emergency services (911 in the [place]  A suicide crisis helpline, such as the [facility] at 501-805-8059. This is open 24 hours a day. Summary  Substance use disorder occurs  when a person's repeated use of drugs or alcohol interferes with his or her ability to be productive.  Taking part in group and individual counseling from mental health providers is a common treatment for people with substance use disorder.  Recovery can be a long process. Many people who undergo treatment start using the substance again after stopping (relapse). A relapse does not mean that treatment will not work.  Attend support groups such as Alcoholics Anonymous and Narcotics Anonymous. These groups are an important part of long-term recovery for many people. This information is not intended to replace advice given to you by your health care provider. Make sure you discuss any questions you have with your health care provider. Document Revised: 07/04/2018 Document Reviewed: 04/24/2017 Elsevier Patient Education  2020 [employer].

## 2019-12-27 NOTE — ED Notes (Signed)
Report to include Situation, Background, Assessment, and Recommendations received from [facility]. Patient alert and oriented, warm and dry, in no acute distress. Patient denies SI, HI, AVH and pain. Patient made aware of Q15 minute rounds and Engineer, drilling presence for their safety. Patient instructed to come to me with needs or concerns.

## 2019-12-29 ENCOUNTER — Other Ambulatory Visit: Payer: Self-pay

## 2019-12-29 ENCOUNTER — Ambulatory Visit: Payer: Medicare Other | Attending: Plastic and Reconstructive Surgery | Admitting: Occupational Therapy

## 2019-12-29 DIAGNOSIS — M25641 Stiffness of right hand, not elsewhere classified: Secondary | ICD-10-CM | POA: Diagnosis not present

## 2019-12-29 DIAGNOSIS — R208 Other disturbances of skin sensation: Secondary | ICD-10-CM | POA: Insufficient documentation

## 2019-12-29 DIAGNOSIS — M6281 Muscle weakness (generalized): Secondary | ICD-10-CM | POA: Diagnosis not present

## 2019-12-29 DIAGNOSIS — M25631 Stiffness of right wrist, not elsewhere classified: Secondary | ICD-10-CM | POA: Insufficient documentation

## 2019-12-29 DIAGNOSIS — S68119D Complete traumatic metacarpophalangeal amputation of unspecified finger, subsequent encounter: Secondary | ICD-10-CM | POA: Insufficient documentation

## 2019-12-29 DIAGNOSIS — L905 Scar conditions and fibrosis of skin: Secondary | ICD-10-CM | POA: Insufficient documentation

## 2019-12-29 NOTE — Therapy (Signed)
[name] [address], [place], [address] Phone: [phone]   Fax:  [phone]  Occupational Therapy Treatment  Patient Details  Name: Michael Schmitt MRN: [id number] Date of Birth: 10-04-1970 Referring Provider (OT): Dr Ashok Pall   Encounter Date: 12/29/2019   OT End of Session - 12/29/19 1353    Visit Number 7    Number of Visits 12    Date for OT Re-Evaluation 01/01/20    OT Start Time 1306    OT Stop Time 1340    OT Time Calculation (min) 34 min    Activity Tolerance Patient tolerated treatment well    Behavior During Therapy [facility] for tasks assessed/performed           Past Medical History:  Diagnosis Date  . Anxiety   . Anxiety   . Arthritis    cerv. stenosis, spondylosis, HNP- lower back , has been followed in pain clinic, has  had injection s in cerv. area  . Blood dyscrasia    told that when he was young he was a" free bleeder"  . CAD (coronary artery disease)   . Cervical spondylosis without myelopathy 07/24/2014  . Cervicogenic headache 07/24/2014  . Chronic kidney disease    renal calculi- passed spontaneously  . Depression   . Diabetes mellitus without complication ([place])   . Fatty liver   . GERD (gastroesophageal reflux disease)   . Headache(784.0)   . Hyperlipidemia   . Hypertension   . 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 Mental disorder   . MI, old   . RLS (restless legs syndrome)    detected on sleep study  . Shortness of breath   . Ventricular fibrillation ([place]) 04/05/2016    Past Surgical History:  Procedure Laterality Date  . ANTERIOR CERVICAL DECOMP/DISCECTOMY FUSION  11/13/2011   Procedure: ANTERIOR CERVICAL DECOMPRESSION/DISCECTOMY FUSION 1 LEVEL;  Surgeon: [name], MD;  Location: [facility];  Service: Neurosurgery;  Laterality: N/A;  Anterior Cervical Decompression/discectomy Fusion. Cervical three-four.  Marland Kitchen CARDIAC  CATHETERIZATION N/A 01/01/2015   Procedure: Left Heart Cath and Coronary Angiography;  Surgeon: [name], MD;  Location: [facility];  Service: Cardiovascular;  Laterality: N/A;  . CARDIAC CATHETERIZATION N/A 04/05/2016   Procedure: Left Heart Cath and Coronary Angiography;  Surgeon: Belva Crome, MD;  Location: [place] CV LAB;  Service: Cardiovascular;  Laterality: N/A;  . CARDIAC CATHETERIZATION N/A 04/05/2016   Procedure: Coronary Stent Intervention;  Surgeon: Belva Crome, MD;  Location: [place] CV LAB;  Service: Cardiovascular;  Laterality: N/A;  . CARDIAC CATHETERIZATION N/A 04/05/2016   Procedure: Intravascular Ultrasound/IVUS;  Surgeon: Belva Crome, MD;  Location: [place] [facility];  Service: Cardiovascular;  Laterality: N/A;  . NASAL SINUS SURGERY     2005    There were no vitals filed for this visit.   Subjective Assessment - 12/29/19 1352    Subjective  My thumb is little numb -I had nervous break down since I have seen you and the police cuffed me - the cuffs was tight on this wrist    Pertinent History Michael Schmitt is a 49 y.o. 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 While admitted, he was evaluated by plastic surgery and underwent partial amputation of right 3rd (at DIP), 4th (at PIP) and fifth (at DIP) fingers on 10/15/19. He presents today for first post-operative evaluation. Patient reports ongoing pain, only managed with oxycodone. He runs out of prescription tomorrow. He was not on any narcotic pain medication prior to accident.    Patient  Stated Goals Want the pain and motion better so I can use my hand again -    Currently in Pain? No/denies              [facility] OT Assessment - 12/29/19 0001      Strength   Right Hand Grip (lbs) 55    Right Hand Lateral Pinch 28 lbs    Right Hand 3 Point Pinch 20 lbs    Left Hand Grip (lbs) 88    Left Hand Lateral Pinch 30 lbs    Left Hand 3 Point Pinch 28 lbs      Right Hand AROM   R Index  MCP 0-90 90 Degrees    R Long  MCP 0-90 85 Degrees    R Long PIP 0-100 90 Degrees   -10   R Ring  MCP 0-90 90 Degrees    R Little  MCP 0-90 85 Degrees    R Little PIP 0-100 65 Degrees   75 AROM in session- 80 PROM           Pt made great progress in grip strength  And extention of 3rd PIP - laterally -10  And 5th 0 degrees Flexion about same  Pt report using new skin on 4th - still not closed - reinforce again for pt to follow up with surgeon          OT Treatments/Exercises (OP) - 12/29/19 0001      Moist Heat Therapy   Number Minutes Moist Heat 6 Minutes    Moist Heat Location Hand   2nd 3 min coban flexion wrap for 3rd and 5th          cont withLMB splinsfor PIP extention at home on 5th and 3rd   report his been using it at home - ask if can use more than 2 x day - pt can use up to 5 x day - but slight pull  And not increase edema 4th digit incision looking better than last time - healing - and closing up - pt says he is using new skin   per pt missed his appt and cannot reschedule - he do not have transportation to [employer]   This date ed pt on use of coban for flexion wrap to 3rd and 5th to increase PIP flexion- but only 3 min at time And then PROM to PIP     AAROM for MC flexion 90 degrees- kept 3 x 30 sec PROMflexioncomposite for  3rd and 5th 2 x 2 min  Composite fist - flexion place and hold  Upgrade and add 1/2 firm green to his teal medium putty -  Gripping  All digits ,  3 point grip, and then 2 point using 3rd and 5th with thumb - pain  free  And only 12 reps  Pain free  2 x day -REINFORCE not to over do -or squeeze  Pt to focus  on having 4th digit incision heal          OT Education - 12/29/19 1353    Education Details progress and HEP changes    Person(s) Educated Patient    Methods Explanation;Demonstration;Tactile cues;Verbal cues;Handout    Comprehension Verbal cues required;Returned demonstration;Verbalized understanding            OT Short Term Goals - 12/22/19 1051      OT SHORT TERM GOAL #1   Title Pt to be independent in HEP to tolerate difference textures on stumps and reaching for objects with R hand    Baseline cannot reach - pain 7/10 when touching hard surface, tolerate light massage but not rougher textures or tapping - only pain with bumping into hard surface - ring finger still partially open again    Time 4    Period Weeks    Status On-going    Target Date 01/05/20      OT SHORT TERM GOAL #2   Title R digits AROM improve in [place] and PIP's  flexoin to [facility] to grip untencil,  squeez washcloth    Baseline MC's 50-75 degrees, PIP's 30-90 - great progress but need more flexion at PIP of 3rd and 5th - squeez washcloth    Time 4    Period Weeks    Status On-going    Target Date 01/05/20             OT Long Term Goals - 12/22/19 1053      OT LONG TERM GOAL #1   Title R hand digits extention increase to WNL to put hand in pocket and glove    Baseline MC's WNL , PIP -15 at 3rd and 5th -5    Time 4    Period Weeks    Status On-going    Target Date 01/05/20      OT LONG TERM GOAL #2   Title R hand grip strength increase to more than 40% compare to L hand to squeeze washcloth , and hold fishing rod    Baseline Grip strength R 41 lbs ,L 88 lbs    Time 4    Period Weeks    Status On-going    Target Date 01/01/20      OT LONG TERM GOAL #3   Title Pain on PRHWE in R hand decrease with more than 20 points    Baseline at eval PRWHE score for pain 38/50 - improve to 16/50    Status  Achieved                 Plan - 12/29/19 1354    Clinical Impression Statement Pt is about 10 wks s/p partial amputaion of 3rd and 5th DIP and 4th PIP - pt cont to have new skin on 4th and report his on 2nd round of amoxicillin because of teeth extraction - 2 days left on it - pt extention of PIP improving and great progress in grip strength- but flexion of 3rd and 5th PIP about same - add this date flexion stretch using coban to HEP - but reinforce only to use 3 min at time -and still focus on extention -and again remind pt to make appt with surgeon for follow up - emotionally pt at his usual baseline today    OT Occupational Profile and History Problem Focused Assessment - Including review of records relating to presenting problem    Occupational performance deficits (Please refer to evaluation for details): ADL's;IADL's;Rest and Sleep;Play;Leisure  Body Structure / Function / Physical Skills ADL;Decreased knowledge of precautions;FMC;Scar mobility;ROM;UE functional use;Flexibility;Sensation;Dexterity;Edema;Skin integrity;Strength;Pain;IADL    Rehab Potential Good    Clinical Decision Making Limited treatment options, no task modification necessary    Comorbidities Affecting Occupational Performance: May have comorbidities impacting occupational performance    Modification or Assistance to Complete Evaluation  No modification of tasks or assist necessary to complete eval    OT Frequency 1x / week    OT Duration 2 weeks    OT Treatment/Interventions Self-care/ADL training;Paraffin;Fluidtherapy;Contrast Bath;DME and/or AE instruction;Therapeutic exercise;Manual Therapy;Passive range of motion;Scar mobilization;Splinting;Patient/family education    Plan assess progress with HEP    OT Home Exercise Plan see pt instruction    Consulted and Agree with Plan of Care Patient           Patient will benefit from skilled therapeutic intervention in order to improve the following deficits and  impairments:   Body Structure / Function / Physical Skills: ADL, Decreased knowledge of precautions, FMC, Scar mobility, ROM, UE functional use, Flexibility, Sensation, Dexterity, Edema, Skin integrity, Strength, Pain, IADL       Visit Diagnosis: Amputation of digit of right hand, subsequent encounter  Scar condition and fibrosis of skin  Other disturbances of skin sensation  Stiffness of right hand, not elsewhere classified  Stiffness of right wrist, not elsewhere classified  Muscle weakness (generalized)    Problem List Patient Active Problem List   Diagnosis Date Noted  . 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 Lithium toxicity   . Hyponatremia 01/24/2019  . Substance induced mood disorder ([place]) 11/22/2018  . Alcohol abuse with alcohol-induced mood disorder ([place]) 09/21/2018  . Overdose 05/17/2018  . Depression, major, recurrent, severe with psychosis ([place]) 03/30/2018  . Personality disorder ([place]) 12/31/2017  . MDD (major depressive disorder), recurrent episode, severe ([place]) 11/20/2017  . Dysthymia 10/26/2017  . Major depressive disorder, recurrent episode, moderate ([place]) 03/10/2017  . Generalized anxiety disorder 02/05/2017  . Chronic neck pain (Primary Area of Pain) (Left) 11/22/2016  . Cervical facet syndrome (Left) 11/22/2016  . Cervical radiculitis (Left) 11/22/2016  . History of cervical spinal surgery 11/22/2016  . Cervical spondylosis 11/22/2016  . Chronic shoulder pain (Secondary Area of Pain) (Left) 11/22/2016  . Arthropathy of shoulder (Left) 11/22/2016  . Chronic low back pain [facility] Area of Pain) (Bilateral) (L>R) 11/22/2016  . Lumbar facet syndrome (Bilateral) (L>R) 11/22/2016  . Chronic pain syndrome 11/21/2016  . Long term prescription benzodiazepine use 11/21/2016  . Opiate use 11/21/2016  . Type 2 diabetes mellitus without complication, without long-term current use of insulin ([place]) 10/19/2016  . Chronic Suicidal ideation 09/15/2016   . Coronary artery disease 09/12/2016  . HTN (hypertension) 06/26/2016  . GERD (gastroesophageal reflux disease) 06/26/2016  . Alcohol use disorder, severe, 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[place]) 06/26/2016  . Benzodiazepine abuse ([place]) 06/11/2016  . Sedative, hypnotic or anxiolytic use disorder, severe, dependence ([place]) 05/12/2016  . Ventricular fibrillation ([place]) 04/05/2016  . Combined hyperlipidemia 01/05/2016  . Cervical spondylosis without myelopathy 07/24/2014  . Tobacco use disorder 10/20/2010  . Asthma 10/20/2010    Rosalyn Gess OTR/L,CLT 12/29/2019, 1:59 PM  [facility] [address], [place], [address] Phone: [phone]   Fax:  [phone]  Name: Michael Schmitt MRN: [id number] Date of Birth: 1970/05/15

## 2020-01-01 ENCOUNTER — Ambulatory Visit: Payer: Medicare Other | Attending: Family Medicine | Admitting: Physical Therapy

## 2020-01-01 DIAGNOSIS — I2510 Atherosclerotic heart disease of native coronary artery without angina pectoris: Secondary | ICD-10-CM | POA: Diagnosis not present

## 2020-01-01 DIAGNOSIS — I10 Essential (primary) hypertension: Secondary | ICD-10-CM | POA: Diagnosis not present

## 2020-01-03 DIAGNOSIS — E119 Type 2 diabetes mellitus without complications: Secondary | ICD-10-CM | POA: Diagnosis not present

## 2020-01-03 DIAGNOSIS — I10 Essential (primary) hypertension: Secondary | ICD-10-CM | POA: Diagnosis not present

## 2020-01-03 DIAGNOSIS — E785 Hyperlipidemia, unspecified: Secondary | ICD-10-CM | POA: Diagnosis not present

## 2020-01-03 DIAGNOSIS — I2510 Atherosclerotic heart disease of native coronary artery without angina pectoris: Secondary | ICD-10-CM | POA: Diagnosis not present

## 2020-01-04 DIAGNOSIS — E785 Hyperlipidemia, unspecified: Secondary | ICD-10-CM | POA: Diagnosis not present

## 2020-01-04 DIAGNOSIS — I2510 Atherosclerotic heart disease of native coronary artery without angina pectoris: Secondary | ICD-10-CM | POA: Diagnosis not present

## 2020-01-04 DIAGNOSIS — I10 Essential (primary) hypertension: Secondary | ICD-10-CM | POA: Diagnosis not present

## 2020-01-04 DIAGNOSIS — E119 Type 2 diabetes mellitus without complications: Secondary | ICD-10-CM | POA: Diagnosis not present

## 2020-01-06 ENCOUNTER — Ambulatory Visit: Payer: Medicare Other | Admitting: Occupational Therapy

## 2020-01-08 DIAGNOSIS — H5213 Myopia, bilateral: Secondary | ICD-10-CM | POA: Diagnosis not present

## 2020-01-13 DIAGNOSIS — I10 Essential (primary) hypertension: Secondary | ICD-10-CM | POA: Diagnosis not present

## 2020-01-13 DIAGNOSIS — E1165 Type 2 diabetes mellitus with hyperglycemia: Secondary | ICD-10-CM | POA: Diagnosis not present

## 2020-01-13 DIAGNOSIS — Z79899 Other long term (current) drug therapy: Secondary | ICD-10-CM | POA: Diagnosis not present

## 2020-01-13 DIAGNOSIS — I2510 Atherosclerotic heart disease of native coronary artery without angina pectoris: Secondary | ICD-10-CM | POA: Diagnosis not present

## 2020-01-13 DIAGNOSIS — F17210 Nicotine dependence, cigarettes, uncomplicated: Secondary | ICD-10-CM | POA: Diagnosis not present

## 2020-01-13 DIAGNOSIS — G8929 Other chronic pain: Secondary | ICD-10-CM | POA: Diagnosis not present

## 2020-01-13 DIAGNOSIS — Z7902 Long term (current) use of antithrombotics/antiplatelets: Secondary | ICD-10-CM | POA: Diagnosis not present

## 2020-01-13 DIAGNOSIS — E782 Mixed hyperlipidemia: Secondary | ICD-10-CM | POA: Diagnosis not present

## 2020-01-13 DIAGNOSIS — M542 Cervicalgia: Secondary | ICD-10-CM | POA: Diagnosis not present

## 2020-01-23 DIAGNOSIS — F32A Depression, unspecified: Secondary | ICD-10-CM | POA: Diagnosis not present

## 2020-01-23 DIAGNOSIS — G4709 Other insomnia: Secondary | ICD-10-CM | POA: Diagnosis not present

## 2020-02-03 ENCOUNTER — Ambulatory Visit: Payer: Medicare Other | Attending: Family Medicine | Admitting: Physical Therapy

## 2020-02-08 DIAGNOSIS — I252 Old myocardial infarction: Secondary | ICD-10-CM | POA: Diagnosis not present

## 2020-02-08 DIAGNOSIS — Z825 Family history of asthma and other chronic lower respiratory diseases: Secondary | ICD-10-CM | POA: Diagnosis not present

## 2020-02-08 DIAGNOSIS — I120 Hypertensive chronic kidney disease with stage 5 chronic kidney disease or end stage renal disease: Secondary | ICD-10-CM | POA: Diagnosis not present

## 2020-02-08 DIAGNOSIS — N186 End stage renal disease: Secondary | ICD-10-CM | POA: Diagnosis not present

## 2020-02-08 DIAGNOSIS — Z888 Allergy status to other drugs, medicaments and biological substances status: Secondary | ICD-10-CM | POA: Diagnosis not present

## 2020-02-08 DIAGNOSIS — Z880 Allergy status to penicillin: Secondary | ICD-10-CM | POA: Diagnosis not present

## 2020-02-08 DIAGNOSIS — Z7982 Long term (current) use of aspirin: Secondary | ICD-10-CM | POA: Diagnosis not present

## 2020-02-08 DIAGNOSIS — Z79899 Other long term (current) drug therapy: Secondary | ICD-10-CM | POA: Diagnosis not present

## 2020-02-08 DIAGNOSIS — I2510 Atherosclerotic heart disease of native coronary artery without angina pectoris: Secondary | ICD-10-CM | POA: Diagnosis not present

## 2020-02-08 DIAGNOSIS — Z7902 Long term (current) use of antithrombotics/antiplatelets: Secondary | ICD-10-CM | POA: Diagnosis not present

## 2020-02-08 DIAGNOSIS — F17210 Nicotine dependence, cigarettes, uncomplicated: Secondary | ICD-10-CM | POA: Diagnosis not present

## 2020-02-08 DIAGNOSIS — Z043 Encounter for examination and observation following other accident: Secondary | ICD-10-CM | POA: Diagnosis not present

## 2020-02-08 DIAGNOSIS — Z7984 Long term (current) use of oral hypoglycemic drugs: Secondary | ICD-10-CM | POA: Diagnosis not present

## 2020-02-08 DIAGNOSIS — Z981 Arthrodesis status: Secondary | ICD-10-CM | POA: Diagnosis not present

## 2020-02-08 DIAGNOSIS — M1990 Unspecified osteoarthritis, unspecified site: Secondary | ICD-10-CM | POA: Diagnosis not present

## 2020-02-08 DIAGNOSIS — Z885 Allergy status to narcotic agent status: Secondary | ICD-10-CM | POA: Diagnosis not present

## 2020-02-08 DIAGNOSIS — E1122 Type 2 diabetes mellitus with diabetic chronic kidney disease: Secondary | ICD-10-CM | POA: Diagnosis not present

## 2020-02-08 DIAGNOSIS — Z823 Family history of stroke: Secondary | ICD-10-CM | POA: Diagnosis not present

## 2020-02-11 DIAGNOSIS — I2510 Atherosclerotic heart disease of native coronary artery without angina pectoris: Secondary | ICD-10-CM | POA: Diagnosis not present

## 2020-02-11 DIAGNOSIS — E319 Polyglandular dysfunction, unspecified: Secondary | ICD-10-CM | POA: Diagnosis not present

## 2020-02-18 DIAGNOSIS — Z79899 Other long term (current) drug therapy: Secondary | ICD-10-CM | POA: Diagnosis not present

## 2020-02-21 DIAGNOSIS — G4700 Insomnia, unspecified: Secondary | ICD-10-CM | POA: Diagnosis not present

## 2020-02-21 DIAGNOSIS — Z7984 Long term (current) use of oral hypoglycemic drugs: Secondary | ICD-10-CM | POA: Diagnosis not present

## 2020-02-21 DIAGNOSIS — M1990 Unspecified osteoarthritis, unspecified site: Secondary | ICD-10-CM | POA: Diagnosis not present

## 2020-02-21 DIAGNOSIS — Z880 Allergy status to penicillin: Secondary | ICD-10-CM | POA: Diagnosis not present

## 2020-02-21 DIAGNOSIS — Z7902 Long term (current) use of antithrombotics/antiplatelets: Secondary | ICD-10-CM | POA: Diagnosis not present

## 2020-02-21 DIAGNOSIS — Z7982 Long term (current) use of aspirin: Secondary | ICD-10-CM | POA: Diagnosis not present

## 2020-02-21 DIAGNOSIS — N186 End stage renal disease: Secondary | ICD-10-CM | POA: Diagnosis not present

## 2020-02-21 DIAGNOSIS — E1122 Type 2 diabetes mellitus with diabetic chronic kidney disease: Secondary | ICD-10-CM | POA: Diagnosis not present

## 2020-02-21 DIAGNOSIS — F17210 Nicotine dependence, cigarettes, uncomplicated: Secondary | ICD-10-CM | POA: Diagnosis not present

## 2020-02-21 DIAGNOSIS — S60512A Abrasion of left hand, initial encounter: Secondary | ICD-10-CM | POA: Diagnosis not present

## 2020-02-21 DIAGNOSIS — I2510 Atherosclerotic heart disease of native coronary artery without angina pectoris: Secondary | ICD-10-CM | POA: Diagnosis not present

## 2020-02-21 DIAGNOSIS — I120 Hypertensive chronic kidney disease with stage 5 chronic kidney disease or end stage renal disease: Secondary | ICD-10-CM | POA: Diagnosis not present

## 2020-02-21 DIAGNOSIS — I252 Old myocardial infarction: Secondary | ICD-10-CM | POA: Diagnosis not present

## 2020-02-21 DIAGNOSIS — Z981 Arthrodesis status: Secondary | ICD-10-CM | POA: Diagnosis not present

## 2020-03-01 DIAGNOSIS — Z79899 Other long term (current) drug therapy: Secondary | ICD-10-CM | POA: Diagnosis not present

## 2020-03-02 DIAGNOSIS — H524 Presbyopia: Secondary | ICD-10-CM | POA: Diagnosis not present

## 2020-03-05 DIAGNOSIS — Z79899 Other long term (current) drug therapy: Secondary | ICD-10-CM | POA: Diagnosis not present

## 2020-03-08 DIAGNOSIS — Z79899 Other long term (current) drug therapy: Secondary | ICD-10-CM | POA: Diagnosis not present

## 2020-03-16 DIAGNOSIS — K2970 Gastritis, unspecified, without bleeding: Secondary | ICD-10-CM | POA: Diagnosis not present

## 2020-03-16 DIAGNOSIS — I2510 Atherosclerotic heart disease of native coronary artery without angina pectoris: Secondary | ICD-10-CM | POA: Diagnosis not present

## 2020-03-16 DIAGNOSIS — Z720 Tobacco use: Secondary | ICD-10-CM | POA: Diagnosis not present

## 2020-03-16 DIAGNOSIS — E119 Type 2 diabetes mellitus without complications: Secondary | ICD-10-CM | POA: Diagnosis not present

## 2020-03-16 DIAGNOSIS — R1110 Vomiting, unspecified: Secondary | ICD-10-CM | POA: Diagnosis not present

## 2020-03-16 DIAGNOSIS — R059 Cough, unspecified: Secondary | ICD-10-CM | POA: Diagnosis not present

## 2020-03-16 DIAGNOSIS — E782 Mixed hyperlipidemia: Secondary | ICD-10-CM | POA: Diagnosis not present

## 2020-03-17 DIAGNOSIS — R109 Unspecified abdominal pain: Secondary | ICD-10-CM | POA: Diagnosis not present

## 2020-06-02 DIAGNOSIS — I998 Other disorder of circulatory system: Secondary | ICD-10-CM | POA: Insufficient documentation

## 2020-08-12 ENCOUNTER — Emergency Department
Admission: EM | Admit: 2020-08-12 | Discharge: 2020-08-12 | Disposition: A | Discharge status: Home / Self Care | Payer: Medicare Other | Attending: Emergency Medicine | Admitting: Emergency Medicine

## 2020-08-12 DIAGNOSIS — Z7982 Long term (current) use of aspirin: Secondary | ICD-10-CM | POA: Diagnosis not present

## 2020-08-12 DIAGNOSIS — Z7902 Long term (current) use of antithrombotics/antiplatelets: Secondary | ICD-10-CM | POA: Diagnosis not present

## 2020-08-12 DIAGNOSIS — I129 Hypertensive chronic kidney disease with stage 1 through stage 4 chronic kidney disease, or unspecified chronic kidney disease: Secondary | ICD-10-CM | POA: Diagnosis not present

## 2020-08-12 DIAGNOSIS — R45851 Suicidal ideations: Secondary | ICD-10-CM | POA: Insufficient documentation

## 2020-08-12 DIAGNOSIS — Z79899 Other long term (current) drug therapy: Secondary | ICD-10-CM | POA: Insufficient documentation

## 2020-08-12 DIAGNOSIS — F609 Personality disorder, unspecified: Secondary | ICD-10-CM | POA: Diagnosis present

## 2020-08-12 DIAGNOSIS — F331 Major depressive disorder, recurrent, moderate: Secondary | ICD-10-CM | POA: Diagnosis present

## 2020-08-12 DIAGNOSIS — F17210 Nicotine dependence, cigarettes, uncomplicated: Secondary | ICD-10-CM | POA: Diagnosis not present

## 2020-08-12 DIAGNOSIS — I2510 Atherosclerotic heart disease of native coronary artery without angina pectoris: Secondary | ICD-10-CM | POA: Diagnosis not present

## 2020-08-12 DIAGNOSIS — N189 Chronic kidney disease, unspecified: Secondary | ICD-10-CM | POA: Insufficient documentation

## 2020-08-12 DIAGNOSIS — J45909 Unspecified asthma, uncomplicated: Secondary | ICD-10-CM | POA: Diagnosis not present

## 2020-08-12 DIAGNOSIS — E1122 Type 2 diabetes mellitus with diabetic chronic kidney disease: Secondary | ICD-10-CM | POA: Insufficient documentation

## 2020-08-12 DIAGNOSIS — Z7984 Long term (current) use of oral hypoglycemic drugs: Secondary | ICD-10-CM | POA: Diagnosis not present

## 2020-08-12 DIAGNOSIS — Z20822 Contact with and (suspected) exposure to covid-19: Secondary | ICD-10-CM | POA: Insufficient documentation

## 2020-08-12 DIAGNOSIS — R451 Restlessness and agitation: Secondary | ICD-10-CM | POA: Diagnosis present

## 2020-08-12 LAB — COMPREHENSIVE METABOLIC PANEL
ALT: 29 U/L (ref 0–44)
AST: 28 U/L (ref 15–41)
Albumin: 3.9 g/dL (ref 3.5–5.0)
Alkaline Phosphatase: 112 U/L (ref 38–126)
Anion gap: 12 (ref 5–15)
BUN: <5 mg/dL — ABNORMAL LOW (ref 6–20)
CO2: 21 mmol/L — ABNORMAL LOW (ref 22–32)
Calcium: 9.5 mg/dL (ref 8.9–10.3)
Chloride: 105 mmol/L (ref 98–111)
Creatinine, Ser: 0.75 mg/dL (ref 0.61–1.24)
GFR, Estimated: >60 mL/min (ref >60)
Glucose, Bld: 141 mg/dL — ABNORMAL HIGH (ref 70–99)
Potassium: 3.5 mmol/L (ref 3.5–5.1)
Sodium: 138 mmol/L (ref 135–145)
Total Bilirubin: 0.5 mg/dL (ref 0.3–1.2)
Total Protein: 7.1 g/dL (ref 6.5–8.1)

## 2020-08-12 LAB — URINE DRUG SCREEN, QUALITATIVE (ARMC ONLY)
Amphetamines, Ur Screen: NOT DETECTED
Barbiturates, Ur Screen: NOT DETECTED
Benzodiazepine, Ur Scrn: NOT DETECTED
Cannabinoid 50 Ng, Ur ~~LOC~~: NOT DETECTED
Cocaine Metabolite,Ur ~~LOC~~: NOT DETECTED
MDMA (Ecstasy)Ur Screen: NOT DETECTED
Methadone Scn, Ur: NOT DETECTED
Opiate, Ur Screen: NOT DETECTED
Phencyclidine (PCP) Ur S: NOT DETECTED
Tricyclic, Ur Screen: POSITIVE — AB

## 2020-08-12 LAB — CBC
HCT: 42.2 % (ref 39.0–52.0)
Hemoglobin: 14.1 g/dL (ref 13.0–17.0)
MCH: 29.8 pg (ref 26.0–34.0)
MCHC: 33.4 g/dL (ref 30.0–36.0)
MCV: 89.2 fL (ref 80.0–100.0)
Platelets: 270 10*3/uL (ref 150–400)
RBC: 4.73 MIL/uL (ref 4.22–5.81)
RDW: 15.1 % (ref 11.5–15.5)
WBC: 8.4 10*3/uL (ref 4.0–10.5)
nRBC: 0 % (ref 0.0–0.2)

## 2020-08-12 LAB — RESP PANEL BY RT-PCR (FLU A&B, COVID) ARPGX2
Influenza A by PCR: NEGATIVE
Influenza B by PCR: NEGATIVE
SARS Coronavirus 2 by RT PCR: NEGATIVE

## 2020-08-12 MED ORDER — HALOPERIDOL LACTATE 5 MG/ML IJ SOLN
10.0000 mg | Freq: Once | INTRAMUSCULAR | Status: AC
  Administered 2020-08-12: 10 mg via INTRAMUSCULAR
  Filled 2020-08-12: qty 2

## 2020-08-12 MED ORDER — LORAZEPAM 2 MG/ML IJ SOLN
2.0000 mg | Freq: Once | INTRAMUSCULAR | Status: AC
  Administered 2020-08-12: 2 mg via INTRAMUSCULAR
  Filled 2020-08-12: qty 1

## 2020-08-12 NOTE — ED Provider Notes (Signed)
[facility] Emergency Department Provider Note   ____________________________________________    I have reviewed the triage vital signs and the nursing notes.   HISTORY  Chief Complaint Suicidal     HPI Michael Schmitt is a 50 y.o. male with history of depression substance abuse, presents with complaints of suicidality.  Patient is agitated and screaming loudly.  This is a frequent type of presentation for him.  He states that he needs something to help him sleep.  History is severely limited  Past Medical History:  Diagnosis Date  . Anxiety   . Anxiety   . Arthritis    cerv. stenosis, spondylosis, HNP- lower back , has been followed in pain clinic, has  had injection s in cerv. area  . Blood dyscrasia    told that when he was young he was a" free bleeder"  . CAD (coronary artery disease)   . Cervical spondylosis without myelopathy 07/24/2014  . Cervicogenic headache 07/24/2014  . Chronic kidney disease    renal calculi- passed spontaneously  . Depression   . Diabetes mellitus without complication ([place])   . Fatty liver   . GERD (gastroesophageal reflux disease)   . Headache(784.0)   . Hyperlipidemia   . Hypertension   . 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 Mental disorder   . MI, old   . RLS (restless legs syndrome)    detected on sleep study  . Shortness of breath   . Ventricular fibrillation ([place]) 04/05/2016    Patient Active Problem List   Diagnosis Date Noted  . 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 Lithium toxicity   . Hyponatremia 01/24/2019  . Substance induced mood disorder ([place]) 11/22/2018  . Alcohol abuse with alcohol-induced mood disorder ([place]) 09/21/2018  . Overdose 05/17/2018  . Depression, major, recurrent, severe with psychosis ([place]) 03/30/2018  . Personality disorder ([place]) 12/31/2017  . MDD (major depressive disorder), recurrent episode, severe ([place]) 11/20/2017   . Dysthymia 10/26/2017  . Major depressive disorder, recurrent episode, moderate ([place]) 03/10/2017  . Generalized anxiety disorder 02/05/2017  . Chronic neck pain (Primary Area of Pain) (Left) 11/22/2016  . Cervical facet syndrome (Left) 11/22/2016  . Cervical radiculitis (Left) 11/22/2016  . History of cervical spinal surgery 11/22/2016  . Cervical spondylosis 11/22/2016  . Chronic shoulder pain (Secondary Area of Pain) (Left) 11/22/2016  . Arthropathy of shoulder (Left) 11/22/2016  . Chronic low back pain [facility] Area of Pain) (Bilateral) (L>R) 11/22/2016  . Lumbar facet syndrome (Bilateral) (L>R) 11/22/2016  . Chronic pain syndrome 11/21/2016  . Long term prescription benzodiazepine use 11/21/2016  . Opiate use 11/21/2016  . Type 2 diabetes mellitus without complication, without long-term current use of insulin ([place]) 10/19/2016  . Chronic Suicidal ideation 09/15/2016  . Coronary artery disease 09/12/2016  . HTN (hypertension) 06/26/2016  . GERD (gastroesophageal reflux disease) 06/26/2016  . Alcohol use disorder, severe, 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[place]) 06/26/2016  . Benzodiazepine abuse ([place]) 06/11/2016  . Sedative, hypnotic or anxiolytic use disorder, severe, dependence ([place]) 05/12/2016  . Ventricular fibrillation ([place]) 04/05/2016  . Combined hyperlipidemia 01/05/2016  . Cervical spondylosis without myelopathy 07/24/2014  . Tobacco use disorder 10/20/2010  . Asthma 10/20/2010    Past Surgical History:  Procedure Laterality Date  . ANTERIOR CERVICAL DECOMP/DISCECTOMY FUSION  11/13/2011   Procedure: ANTERIOR CERVICAL DECOMPRESSION/DISCECTOMY FUSION 1 LEVEL;  Surgeon: [name], MD;  Location: [place];  Service: Neurosurgery;  Laterality: N/A;  Anterior Cervical Decompression/discectomy Fusion. Cervical three-four.  Marland Kitchen CARDIAC CATHETERIZATION N/A 01/01/2015   Procedure: Left Heart Cath and Coronary Angiography;  Surgeon: [name], MD;  Location: [place] [facility];  Service:  Cardiovascular;  Laterality: N/A;  . CARDIAC CATHETERIZATION N/A 04/05/2016   Procedure: Left Heart Cath and Coronary Angiography;  Surgeon: Belva Crome, MD;  Location: [place] [facility];  Service: Cardiovascular;  Laterality: N/A;  . CARDIAC CATHETERIZATION N/A 04/05/2016   Procedure: Coronary Stent Intervention;  Surgeon: Belva Crome, MD;  Location: [place] [facility];  Service: Cardiovascular;  Laterality: N/A;  . CARDIAC CATHETERIZATION N/A 04/05/2016   Procedure: Intravascular Ultrasound/IVUS;  Surgeon: Belva Crome, MD;  Location: [place] [facility];  Service: Cardiovascular;  Laterality: N/A;  . NASAL SINUS SURGERY     2005    Prior to Admission medications   Medication Sig Start Date End Date Taking? Authorizing Provider  aspirin 81 MG EC tablet Take 1 tablet (81 mg total) by mouth daily. For heart health 11/21/17   Lindell Spar I, NP  atorvastatin (LIPITOR) 10 MG tablet Take 10 mg by mouth daily. 12/16/19   [provider]  carvedilol (COREG) 12.5 MG tablet Take 12.5 mg by mouth 2 (two) times daily with a meal. 10/25/18 12/27/19  [provider]  chlorproMAZINE (THORAZINE) 200 MG tablet Take 600 mg by mouth at bedtime. 11/17/19   [provider]  clopidogrel (PLAVIX) 75 MG tablet Take 75 mg by mouth daily. 12/16/19   [provider]  escitalopram (LEXAPRO) 20 MG tablet Take 20 mg by mouth daily.    [provider]  lisinopril (ZESTRIL) 10 MG tablet Take 10 mg by mouth daily. 12/16/19   [provider]  metFORMIN (GLUCOPHAGE) 1000 MG tablet Take 1,000 mg by mouth 2 (two) times daily. 10/02/18   [provider]  mirtazapine (REMERON) 45 MG tablet Take 45 mg by mouth at bedtime.    [provider]     Allergies Prednisone, Amoxicillin, Hydrocodone, Varenicline, and Trazodone and nefazodone  Family History  Problem Relation Age of Onset  . Prostate cancer Father   . Hypertension Mother   . Kidney Stones Mother   .  Anxiety disorder Mother   . Depression Mother   . COPD Sister   . Hypertension Sister   . Diabetes Sister   . Depression Sister   . Anxiety disorder Sister   . Seizures Sister   . ADD / ADHD Son   . ADD / ADHD Daughter     Social History Social History   Tobacco Use  . Smoking status: Current Every Day Smoker    Packs/day: 2.00    Years: 17.00    Pack years: 34.00    Types: Cigarettes, Cigars  . Smokeless tobacco: Former Systems developer  . Tobacco comment: occassional snuff   Vaping Use  . Vaping Use: Never used  Substance Use Topics  . Alcohol use: No    Alcohol/week: 0.0 standard drinks    Comment: last drinked in 3 months.   . Drug use: No    Review of Systems limited   Cardiovascular: Denies chest pain. Respiratory: Denies shortness of breath. Gastrointestinal: No abdominal pain.       ____________________________________________   PHYSICAL EXAM:  VITAL SIGNS: ED Triage Vitals [08/12/20 1236]  Enc Vitals Group     BP (!) 154/102     Pulse Rate 84     Resp 18  Temp 98.1 F (36.7 C)     Temp Source Oral     SpO2 100 %     Weight      Height      Head Circumference      Peak Flow      Pain Score      Pain Loc      Pain Edu?      Excl. in [place]?     Constitutional: Alert and oriented Eyes: Conjunctivae are normal.  Head: Atraumatic. Nose: No congestion/rhinnorhea.  Cardiovascular: Normal rate, regular rhythm.  Good peripheral circulation. Respiratory: Normal respiratory effort.  No retractions. Gastrointestinal: Soft and nontender. No distention.    Musculoskeletal:   Warm and well perfused Neurologic:  Normal speech and language. No gross focal neurologic deficits are appreciated.  Skin:  Skin is warm, dry and intact. No rash noted. Psychiatric: Agitated, complaining of SI  ____________________________________________   LABS (all labs ordered are listed, but only abnormal results are displayed)  Labs Reviewed  COMPREHENSIVE METABOLIC PANEL  - Abnormal; Notable for the following components:      Result Value   CO2 21 (*)    Glucose, Bld 141 (*)    BUN <5 (*)    All other components within normal limits  URINE DRUG SCREEN, QUALITATIVE ([facility] ONLY) - Abnormal; Notable for the following components:   Tricyclic, Ur Screen POSITIVE (*)    All other components within normal limits  RESP PANEL BY RT-PCR (FLU A&B, COVID) ARPGX2  CBC   ____________________________________________  EKG   ____________________________________________  RADIOLOGY   ____________________________________________   PROCEDURES  Procedure(s) performed: No  Procedures   Critical Care performed: No ____________________________________________   INITIAL IMPRESSION / ASSESSMENT AND PLAN / ED COURSE  Pertinent labs & imaging results that were available during my care of the patient were reviewed by me and considered in my medical decision making (see chart for details).  Patient known to our emergency department.  He is screaming loudly, stating that he needs help sleeping.  He is agitated and threatening to the staff.  IM Haldol and IM Ativan given to help patient rest.  Consulted psychiatry and TTS    ____________________________________________   FINAL CLINICAL IMPRESSION(S) / ED DIAGNOSES  Final diagnoses:  Suicidal thoughts        Note:  This document was prepared using Dragon voice recognition software and may include unintentional dictation errors.   Lavonia Drafts, MD 08/12/20 1401

## 2020-08-12 NOTE — ED Notes (Signed)
Pt discharged home. Pt refused VS.  Belongings returned to patient. Discharge instructions given to patients.

## 2020-08-12 NOTE — Discharge Instructions (Addendum)
Please seek medical attention and help for any thoughts about wanting to harm yourself, harm others, any concerning change in behavior, severe depression, inappropriate drug use or any other new or concerning symptoms. ° °

## 2020-08-12 NOTE — ED Triage Notes (Signed)
BIB [facility] from home due to suicidal thoughts. Pt with random outburst of yelling at time of arrival. States "I need help". No SI attempt or plan today. EDP at bedside. VSS with EMS

## 2020-08-12 NOTE — ED Notes (Signed)
Pt given clothes and discharge instructions.  Refused VS.

## 2020-08-12 NOTE — ED Notes (Signed)
Pt asking about his prescriptions.  RN attempted to contact [name], but patient continued to yell and curse loudly in the dayroom.  RN asked security to escort patient to lobby.   *RN spoke with [name] and no prescriptions were going to be sent home with patient.

## 2020-08-12 NOTE — ED Notes (Signed)
Pt given water, graham crackers and PB.

## 2020-08-12 NOTE — ED Notes (Signed)
Pt yelling from the bathroom and his room.

## 2020-08-12 NOTE — Consult Note (Signed)
[facility] Consult   Reason for Consult: Consult for 50 year old man well-known to the psychiatric service who came voluntarily to the emergency room complaining of depression and anxiety Referring Physician: Corky Downs Patient Identification: Michael Schmitt MRN:  [id number] Principal Diagnosis: Personality disorder [facility]) Diagnosis:  Principal Problem:   Personality disorder ([place]) Active Problems:   Major depressive disorder, recurrent episode, moderate ([place])   Total Time spent with patient: 1 hour  Subjective:   Michael Schmitt is a 50 y.o. male patient admitted with "I need help".  HPI: Patient seen chart reviewed.  Patient familiar from previous encounters.  50 year old man with longstanding chronic personality disorder with chronic anxiety and dysphoria.  Comes to the emergency room saying he has run out of his medicine for several days.  He is feeling anxious and agitated.  He endorses passive suicidal ideation but has not acted out to hurt himself and does not have a specific plan.  He denies that he has been using any drugs or alcohol.  Still living with the sister.  Still feeling generally overwhelmed by his medical problems.  He says that he was last hospitalized at [facility] and has a follow-up appointment at [facility] at the end of the month.  Patient says his most recent medications were Seroquel and Effexor and clonazepam but he does not recall dosages.  Past Psychiatric History: Past history of multiple hospitalizations and multiple ER visits.  Chronic anxiety chronic depression and chronic suicidal ideation.  Has a history of self-injury in the past.  History of substance abuse especially with benzodiazepines.  History of repeated noncompliance with recommended treatment.  Risk to Self:   Risk to Others:   Prior Inpatient Therapy:   Prior Outpatient Therapy:    Past Medical History:  Past Medical History:  Diagnosis Date  .  Anxiety   . Anxiety   . Arthritis    cerv. stenosis, spondylosis, HNP- lower back , has been followed in pain clinic, has  had injection s in cerv. area  . Blood dyscrasia    told that when he was young he was a" free bleeder"  . CAD (coronary artery disease)   . Cervical spondylosis without myelopathy 07/24/2014  . Cervicogenic headache 07/24/2014  . Chronic kidney disease    renal calculi- passed spontaneously  . Depression   . Diabetes mellitus without complication ([place])   . Fatty liver   . GERD (gastroesophageal reflux disease)   . Headache(784.0)   . Hyperlipidemia   . Hypertension   . 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 Mental disorder   . MI, old   . RLS (restless legs syndrome)    detected on sleep study  . Shortness of breath   . Ventricular fibrillation ([place]) 04/05/2016    Past Surgical History:  Procedure Laterality Date  . ANTERIOR CERVICAL DECOMP/DISCECTOMY FUSION  11/13/2011   Procedure: ANTERIOR CERVICAL DECOMPRESSION/DISCECTOMY FUSION 1 LEVEL;  Surgeon: [name], MD;  Location: [facility];  Service: Neurosurgery;  Laterality: N/A;  Anterior Cervical Decompression/discectomy Fusion. Cervical three-four.  Marland Kitchen CARDIAC CATHETERIZATION N/A 01/01/2015   Procedure: Left Heart Cath and Coronary Angiography;  Surgeon: [name], MD;  Location: [place] [facility];  Service: Cardiovascular;  Laterality: N/A;  . CARDIAC CATHETERIZATION N/A 04/05/2016   Procedure: Left Heart Cath and Coronary Angiography;  Surgeon: Belva Crome, MD;  Location: [facility] [facility];  Service: Cardiovascular;  Laterality: N/A;  . CARDIAC CATHETERIZATION N/A 04/05/2016   Procedure: Coronary Stent Intervention;  Surgeon: Belva Crome, MD;  Location: [facility];  Service: Cardiovascular;  Laterality: N/A;  . CARDIAC CATHETERIZATION N/A 04/05/2016   Procedure: Intravascular Ultrasound/IVUS;  Surgeon: Belva Crome, MD;  Location: [place] [facility];   Service: Cardiovascular;  Laterality: N/A;  . NASAL SINUS SURGERY     2005   Family History:  Family History  Problem Relation Age of Onset  . Prostate cancer Father   . Hypertension Mother   . Kidney Stones Mother   . Anxiety disorder Mother   . Depression Mother   . COPD Sister   . Hypertension Sister   . Diabetes Sister   . Depression Sister   . Anxiety disorder Sister   . Seizures Sister   . ADD / ADHD Son   . ADD / ADHD Daughter    Family Psychiatric  History: See previous Social History:  Social History   Substance and Sexual Activity  Alcohol Use No  . Alcohol/week: 0.0 standard drinks   Comment: last drinked in 3 months.      Social History   Substance and Sexual Activity  Drug Use No    Social History   Socioeconomic History  . Marital status: Single    Spouse name: Not on file  . Number of children: 3  . Years of education: 26  . Highest education level: Not on file  Occupational History    Comment: unemployed  Tobacco Use  . Smoking status: Current Every Day Smoker    Packs/day: 2.00    Years: 17.00    Pack years: 34.00    Types: Cigarettes, Cigars  . Smokeless tobacco: Former Systems developer  . Tobacco comment: occassional snuff   Vaping Use  . Vaping Use: Never used  Substance and Sexual Activity  . Alcohol use: No    Alcohol/week: 0.0 standard drinks    Comment: last drinked in 3 months.   . Drug use: No  . Sexual activity: Not Currently  Other Topics Concern  . Not on file  Social History Narrative   Patient is right handed.   Patient drinks 2 sodas daily.   Social Determinants of Health   Financial Resource Strain: Not on file  Food Insecurity: Not on file  Transportation Needs: Not on file  Physical Activity: Not on file  Stress: Not on file  Social Connections: Not on file   Additional Social History:    Allergies:   Allergies  Allergen Reactions  . Prednisone Other (See Comments)    Hypertension and makes him feel "spacey"  .  Amoxicillin Other (See Comments)    Other Reaction: ELEVATED BP  . Hydrocodone Other (See Comments)    Causes headaches and makes the patient irritable  . Varenicline Other (See Comments)    Suicidal thoughts  . Trazodone And Nefazodone Anxiety and Other (See Comments)    Patient stated that this keeps him awake; "has an opposite effect on me"    Labs:  Results for orders placed or performed during the hospital encounter of 08/12/20 (from the past 48 hour(s))  Resp Panel by RT-PCR (Flu A&B, Covid) Nasopharyngeal Swab     Status: None   Collection Time: 08/12/20 11:55 AM   Specimen: Nasopharyngeal Swab; Nasopharyngeal(NP) swabs in vial transport medium  Result Value Ref Range   SARS Coronavirus 2 by RT PCR NEGATIVE NEGATIVE    Comment: (NOTE) SARS-CoV-2 target nucleic  acids are NOT DETECTED.  The SARS-CoV-2 RNA is generally detectable in upper respiratory specimens during the acute phase of infection. The lowest concentration of SARS-CoV-2 viral copies this assay can detect is 138 copies/mL. A negative result does not preclude SARS-Cov-2 infection and should not be used as the sole basis for treatment or other patient management decisions. A negative result may occur with  improper specimen collection/handling, submission of specimen other than nasopharyngeal swab, presence of viral mutation(s) within the areas targeted by this assay, and inadequate number of viral copies(<138 copies/mL). A negative result must be combined with clinical observations, patient history, and epidemiological information. The expected result is Negative.  Fact Sheet for Patients:  EntrepreneurPulse.com.au  Fact Sheet for Healthcare Providers:  IncredibleEmployment.be  This test is no t yet approved or cleared by the [place] FDA and  has been authorized for detection and/or diagnosis of SARS-CoV-2 by FDA under an Emergency Use Authorization (EUA). This EUA  will remain  in effect (meaning this test can be used) for the duration of the COVID-19 declaration under Section 564(b)(1) of the Act, 21 U.S.C.section 360bbb-3(b)(1), unless the authorization is terminated  or revoked sooner.       Influenza A by PCR NEGATIVE NEGATIVE   Influenza B by PCR NEGATIVE NEGATIVE    Comment: (NOTE) The Xpert Xpress SARS-CoV-2/FLU/RSV plus assay is intended as an aid in the diagnosis of influenza from Nasopharyngeal swab specimens and should not be used as a sole basis for treatment. Nasal washings and aspirates are unacceptable for Xpert Xpress SARS-CoV-2/FLU/RSV testing.  Fact Sheet for Patients: EntrepreneurPulse.com.au  Fact Sheet for Healthcare Providers: IncredibleEmployment.be  This test is not yet approved or cleared by the [place] FDA and has been authorized for detection and/or diagnosis of SARS-CoV-2 by FDA under an Emergency Use Authorization (EUA). This EUA will remain in effect (meaning this test can be used) for the duration of the COVID-19 declaration under Section 564(b)(1) of the Act, 21 U.S.C. section 360bbb-3(b)(1), unless the authorization is terminated or revoked.  Performed at [facility], [address] [place], [place] [address]   CBC     Status: None   Collection Time: 08/12/20 12:33 PM  Result Value Ref Range   WBC 8.4 4.0 - 10.5 K/uL   RBC 4.73 4.22 - 5.81 MIL/uL   Hemoglobin 14.1 13.0 - 17.0 g/dL   HCT 42.2 39.0 - 52.0 %   MCV 89.2 80.0 - 100.0 fL   MCH 29.8 26.0 - 34.0 pg   MCHC 33.4 30.0 - 36.0 g/dL   RDW 15.1 11.5 - 15.5 %   Platelets 270 150 - 400 K/uL   nRBC 0.0 0.0 - 0.2 %    Comment: Performed at [facility], [address] [place], [place] [address]  Comprehensive metabolic panel     Status: Abnormal   Collection Time: 08/12/20 12:33 PM  Result Value Ref Range   Sodium 138 135 - 145 mmol/L   Potassium 3.5 3.5 - 5.1 mmol/L   Chloride 105  98 - 111 mmol/L   CO2 21 (L) 22 - 32 mmol/L   Glucose, Bld 141 (H) 70 - 99 mg/dL    Comment: Glucose reference range applies only to samples taken after fasting for at least 8 hours.   BUN <5 (L) 6 - 20 mg/dL   Creatinine, Ser 0.75 0.61 - 1.24 mg/dL   Calcium 9.5 8.9 - 10.3 mg/dL   Total Protein 7.1 6.5 - 8.1 g/dL  Albumin 3.9 3.5 - 5.0 g/dL   AST 28 15 - 41 U/L   ALT 29 0 - 44 U/L   Alkaline Phosphatase 112 38 - 126 U/L   Total Bilirubin 0.5 0.3 - 1.2 mg/dL   GFR, Estimated >60 >60 mL/min    Comment: (NOTE) Calculated using the CKD-EPI Creatinine Equation (2021)    Anion gap 12 5 - 15    Comment: Performed at [facility], [address], [place], [place] [address]  Urine Drug Screen, Qualitative ([facility] only)     Status: Abnormal   Collection Time: 08/12/20 12:33 PM  Result Value Ref Range   Tricyclic, Ur Screen POSITIVE (A) NONE DETECTED   Amphetamines, Ur Screen NONE DETECTED NONE DETECTED   MDMA (Ecstasy)Ur Screen NONE DETECTED NONE DETECTED   Cocaine Metabolite,Ur [place] NONE DETECTED NONE DETECTED   Opiate, Ur Screen NONE DETECTED NONE DETECTED   Phencyclidine (PCP) Ur S NONE DETECTED NONE DETECTED   Cannabinoid 50 Ng, Ur [place] NONE DETECTED NONE DETECTED   Barbiturates, Ur Screen NONE DETECTED NONE DETECTED   Benzodiazepine, Ur Scrn NONE DETECTED NONE DETECTED   Methadone Scn, Ur NONE DETECTED NONE DETECTED    Comment: (NOTE) Tricyclics + metabolites, urine    Cutoff 1000 ng/mL Amphetamines + metabolites, urine  Cutoff 1000 ng/mL MDMA (Ecstasy), urine              Cutoff 500 ng/mL Cocaine Metabolite, urine          Cutoff 300 ng/mL Opiate + metabolites, urine        Cutoff 300 ng/mL Phencyclidine (PCP), urine         Cutoff 25 ng/mL Cannabinoid, urine                 Cutoff 50 ng/mL Barbiturates + metabolites, urine  Cutoff 200 ng/mL Benzodiazepine, urine              Cutoff 200 ng/mL Methadone, urine                   Cutoff 300 ng/mL  The urine drug screen  provides only a preliminary, unconfirmed analytical test result and should not be used for non-medical purposes. Clinical consideration and professional judgment should be applied to any positive drug screen result due to possible interfering substances. A more specific alternate chemical method must be used in order to obtain a confirmed analytical result. Gas chromatography / mass spectrometry (GC/MS) is the preferred confirm atory method. Performed at [facility], [address] [place], [place] [address]     No current facility-administered medications for this encounter.   Current Outpatient Medications  Medication Sig Dispense Refill  . aspirin 81 MG EC tablet Take 1 tablet (81 mg total) by mouth daily. For heart health 30 tablet 1  . atorvastatin (LIPITOR) 10 MG tablet Take 10 mg by mouth daily.    . carvedilol (COREG) 12.5 MG tablet Take 12.5 mg by mouth 2 (two) times daily with a meal.    . chlorproMAZINE (THORAZINE) 200 MG tablet Take 600 mg by mouth at bedtime.    . clopidogrel (PLAVIX) 75 MG tablet Take 75 mg by mouth daily.    Marland Kitchen escitalopram (LEXAPRO) 20 MG tablet Take 20 mg by mouth daily.    Marland Kitchen lisinopril (ZESTRIL) 10 MG tablet Take 10 mg by mouth daily.    . metFORMIN (GLUCOPHAGE) 1000 MG tablet Take 1,000 mg by mouth 2 (two) times daily.    . mirtazapine (  REMERON) 45 MG tablet Take 45 mg by mouth at bedtime.      Musculoskeletal: Strength & Muscle Tone: flaccid Gait & Station: normal Patient leans: N/A            Psychiatric Specialty Exam:  Presentation  General Appearance: No data recorded Eye Contact:No data recorded Speech:No data recorded Speech Volume:No data recorded Handedness:No data recorded  Mood and Affect  Mood:No data recorded Affect:No data recorded  Thought Process  Thought Processes:No data recorded Descriptions of Associations:No data recorded Orientation:No data recorded Thought Content:No data  recorded History of Schizophrenia/Schizoaffective disorder:No data recorded Duration of Psychotic Symptoms:No data recorded Hallucinations:No data recorded Ideas of Reference:No data recorded Suicidal Thoughts:No data recorded Homicidal Thoughts:No data recorded  Sensorium  Memory:No data recorded Judgment:No data recorded Insight:No data recorded  Executive Functions  Concentration:No data recorded Attention Span:No data recorded Recall:No data recorded Fund of Knowledge:No data recorded Language:No data recorded  Psychomotor Activity  Psychomotor Activity:No data recorded  Assets  Assets:No data recorded  Sleep  Sleep:No data recorded  Physical Exam: Physical Exam Vitals and nursing note reviewed.  Constitutional:      Appearance: Normal appearance.  HENT:     Head: Normocephalic and atraumatic.     Mouth/Throat:     Pharynx: Oropharynx is clear.  Eyes:     Pupils: Pupils are equal, round, and reactive to light.  Cardiovascular:     Rate and Rhythm: Normal rate and regular rhythm.  Pulmonary:     Effort: Pulmonary effort is normal.     Breath sounds: Normal breath sounds.  Abdominal:     General: Abdomen is flat.     Palpations: Abdomen is soft.  Musculoskeletal:        General: Normal range of motion.  Skin:    General: Skin is warm and dry.  Neurological:     General: No focal deficit present.     Mental Status: He is alert. Mental status is at baseline.  Psychiatric:        Attention and Perception: He is inattentive.        Mood and Affect: Mood normal. Affect is blunt.        Speech: Speech is delayed.        Behavior: Behavior is withdrawn.        Thought Content: Thought content includes suicidal ideation. Thought content does not include suicidal plan.        Cognition and Memory: Memory is impaired.        Judgment: Judgment is impulsive.    Review of Systems  Constitutional: Negative.   HENT: Negative.   Eyes: Negative.   Respiratory:  Negative.   Cardiovascular: Negative.   Gastrointestinal: Negative.   Musculoskeletal: Negative.   Skin: Negative.   Neurological: Negative.   Psychiatric/Behavioral: Positive for depression and suicidal ideas. Negative for hallucinations, memory loss and substance abuse. The patient is nervous/anxious and has insomnia.    Blood pressure (!) 154/102, pulse 84, temperature 98.1 F (36.7 C), temperature source Oral, resp. rate 18, SpO2 100 %. There is no height or weight on file to calculate BMI.  Treatment Plan Summary: Medication management and Plan 50 year old man with chronic mental health issues presents with anxiety and dysphoria.  Agitation with some of his typical acting out behavior in the emergency room.  When I came to see him he was calm and generally cooperative.  Denied acute suicidal intent.  Denied psychotic symptoms.  Patient says his most recent medicines were  Seroquel and Effexor but does not know dosages.  I could not locate those in the chart.  He also however told me that he does not have any money to get medications even if I wrote him prescriptions.  He is to follow up in just another week and a half with [facility].  Supportive counseling and encouragement.  Case reviewed with emergency room physician.  Patient does not meet criteria for inpatient treatment and has a place to live and appropriate outpatient plans.  Disposition: No evidence of imminent risk to self or others at present.   Patient does not meet criteria for psychiatric inpatient admission. Supportive therapy provided about ongoing stressors. Discussed crisis plan, support from social network, calling 911, coming to the Emergency Department, and calling Suicide Hotline.  Alethia Berthold, MD 08/12/2020 4:18 PM

## 2020-08-13 ENCOUNTER — Other Ambulatory Visit: Payer: Self-pay

## 2020-08-13 ENCOUNTER — Emergency Department
Admission: EM | Admit: 2020-08-13 | Discharge: 2020-08-13 | Disposition: A | Discharge status: Home / Self Care | Payer: Medicare Other | Attending: Emergency Medicine | Admitting: Emergency Medicine

## 2020-08-13 DIAGNOSIS — F609 Personality disorder, unspecified: Secondary | ICD-10-CM | POA: Diagnosis present

## 2020-08-13 DIAGNOSIS — Z7984 Long term (current) use of oral hypoglycemic drugs: Secondary | ICD-10-CM | POA: Insufficient documentation

## 2020-08-13 DIAGNOSIS — F411 Generalized anxiety disorder: Secondary | ICD-10-CM | POA: Diagnosis present

## 2020-08-13 DIAGNOSIS — R45851 Suicidal ideations: Secondary | ICD-10-CM | POA: Diagnosis not present

## 2020-08-13 DIAGNOSIS — Z046 Encounter for general psychiatric examination, requested by authority: Secondary | ICD-10-CM | POA: Diagnosis present

## 2020-08-13 DIAGNOSIS — E1122 Type 2 diabetes mellitus with diabetic chronic kidney disease: Secondary | ICD-10-CM | POA: Insufficient documentation

## 2020-08-13 DIAGNOSIS — F17210 Nicotine dependence, cigarettes, uncomplicated: Secondary | ICD-10-CM | POA: Diagnosis not present

## 2020-08-13 DIAGNOSIS — Z79899 Other long term (current) drug therapy: Secondary | ICD-10-CM | POA: Diagnosis not present

## 2020-08-13 DIAGNOSIS — I2510 Atherosclerotic heart disease of native coronary artery without angina pectoris: Secondary | ICD-10-CM | POA: Insufficient documentation

## 2020-08-13 DIAGNOSIS — Z7902 Long term (current) use of antithrombotics/antiplatelets: Secondary | ICD-10-CM | POA: Insufficient documentation

## 2020-08-13 DIAGNOSIS — Z7982 Long term (current) use of aspirin: Secondary | ICD-10-CM | POA: Diagnosis not present

## 2020-08-13 DIAGNOSIS — N189 Chronic kidney disease, unspecified: Secondary | ICD-10-CM | POA: Insufficient documentation

## 2020-08-13 DIAGNOSIS — J45909 Unspecified asthma, uncomplicated: Secondary | ICD-10-CM | POA: Diagnosis not present

## 2020-08-13 DIAGNOSIS — I129 Hypertensive chronic kidney disease with stage 1 through stage 4 chronic kidney disease, or unspecified chronic kidney disease: Secondary | ICD-10-CM | POA: Insufficient documentation

## 2020-08-13 MED ORDER — LORAZEPAM 2 MG/ML IJ SOLN
2.0000 mg | Freq: Once | INTRAMUSCULAR | Status: AC
  Administered 2020-08-13: 2 mg via INTRAMUSCULAR
  Filled 2020-08-13: qty 1

## 2020-08-13 MED ORDER — QUETIAPINE FUMARATE 200 MG PO TABS: 200.0000 mg | ORAL_TABLET | Freq: Every day | ORAL | 1 refills | Status: DC

## 2020-08-13 MED ORDER — VENLAFAXINE HCL ER 75 MG PO CP24: 75.0000 mg | ORAL_CAPSULE | Freq: Every day | ORAL | 1 refills | Status: DC

## 2020-08-13 NOTE — ED Notes (Signed)
Pt changed into wine scrubs. Belongings secured at nurses station.

## 2020-08-13 NOTE — BH Assessment (Signed)
Writer unable to complete TTS consult. Patient was unable to participate with interview because of medications giving to them.

## 2020-08-13 NOTE — ED Notes (Signed)
BHU and quad full at this time

## 2020-08-13 NOTE — ED Provider Notes (Signed)
[facility] Emergency Department Provider Note   ____________________________________________    I have reviewed the triage vital signs and the nursing notes.   HISTORY  Chief Complaint Suicidal     HPI Michael Schmitt is a 50 y.o. male who was seen by me yesterday for suicidal intentions, acting out, etc.  Patient is well-known to our department.  Was seen by psychiatry yesterday and cleared for discharge.  Apparently he went to [place] today, was IVC for similar behavior and brought to the emergency department  Past Medical History:  Diagnosis Date  . Anxiety   . Anxiety   . Arthritis    cerv. stenosis, spondylosis, HNP- lower back , has been followed in pain clinic, has  had injection s in cerv. area  . Blood dyscrasia    told that when he was young he was a" free bleeder"  . CAD (coronary artery disease)   . Cervical spondylosis without myelopathy 07/24/2014  . Cervicogenic headache 07/24/2014  . Chronic kidney disease    renal calculi- passed spontaneously  . Depression   . Diabetes mellitus without complication ([place])   . Fatty liver   . GERD (gastroesophageal reflux disease)   . Headache(784.0)   . Hyperlipidemia   . Hypertension   . 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 Mental disorder   . MI, old   . RLS (restless legs syndrome)    detected on sleep study  . Shortness of breath   . Ventricular fibrillation ([place]) 04/05/2016    Patient Active Problem List   Diagnosis Date Noted  . 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 Lithium toxicity   . Hyponatremia 01/24/2019  . Substance induced mood disorder ([place]) 11/22/2018  . Alcohol abuse with alcohol-induced mood disorder ([place]) 09/21/2018  . Overdose 05/17/2018  . Depression, major, recurrent, severe with psychosis ([place]) 03/30/2018  . Personality disorder ([place]) 12/31/2017  . MDD (major depressive disorder), recurrent episode, severe  ([place]) 11/20/2017  . Dysthymia 10/26/2017  . Major depressive disorder, recurrent episode, moderate ([place]) 03/10/2017  . Generalized anxiety disorder 02/05/2017  . Chronic neck pain (Primary Area of Pain) (Left) 11/22/2016  . Cervical facet syndrome (Left) 11/22/2016  . Cervical radiculitis (Left) 11/22/2016  . History of cervical spinal surgery 11/22/2016  . Cervical spondylosis 11/22/2016  . Chronic shoulder pain (Secondary Area of Pain) (Left) 11/22/2016  . Arthropathy of shoulder (Left) 11/22/2016  . Chronic low back pain [facility] Area of Pain) (Bilateral) (L>R) 11/22/2016  . Lumbar facet syndrome (Bilateral) (L>R) 11/22/2016  . Chronic pain syndrome 11/21/2016  . Long term prescription benzodiazepine use 11/21/2016  . Opiate use 11/21/2016  . Type 2 diabetes mellitus without complication, without long-term current use of insulin ([place]) 10/19/2016  . Chronic Suicidal ideation 09/15/2016  . Coronary artery disease 09/12/2016  . HTN (hypertension) 06/26/2016  . GERD (gastroesophageal reflux disease) 06/26/2016  . Alcohol use disorder, severe, 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[place]) 06/26/2016  . Benzodiazepine abuse ([place]) 06/11/2016  . Sedative, hypnotic or anxiolytic use disorder, severe, dependence ([place]) 05/12/2016  . Ventricular fibrillation ([place]) 04/05/2016  . Combined hyperlipidemia 01/05/2016  . Cervical spondylosis without myelopathy 07/24/2014  . Tobacco use disorder 10/20/2010  . Asthma 10/20/2010    Past Surgical History:  Procedure Laterality Date  . ANTERIOR CERVICAL DECOMP/DISCECTOMY FUSION  11/13/2011   Procedure: ANTERIOR CERVICAL DECOMPRESSION/DISCECTOMY FUSION 1 LEVEL;  Surgeon: [name], MD;  Location: [place] NEURO ORS;  Service: Neurosurgery;  Laterality: N/A;  Anterior Cervical Decompression/discectomy Fusion. Cervical three-four.  Marland Kitchen CARDIAC CATHETERIZATION N/A 01/01/2015   Procedure: Left Heart Cath and Coronary Angiography;  Surgeon: [name], MD;  Location: [place]  CV LAB;  Service: Cardiovascular;  Laterality: N/A;  . CARDIAC CATHETERIZATION N/A 04/05/2016   Procedure: Left Heart Cath and Coronary Angiography;  Surgeon: Belva Crome, MD;  Location: [place] CV LAB;  Service: Cardiovascular;  Laterality: N/A;  . CARDIAC CATHETERIZATION N/A 04/05/2016   Procedure: Coronary Stent Intervention;  Surgeon: Belva Crome, MD;  Location: [place] CV LAB;  Service: Cardiovascular;  Laterality: N/A;  . CARDIAC CATHETERIZATION N/A 04/05/2016   Procedure: Intravascular Ultrasound/IVUS;  Surgeon: Belva Crome, MD;  Location: [place] CV LAB;  Service: Cardiovascular;  Laterality: N/A;  . NASAL SINUS SURGERY     2005    Prior to Admission medications   Medication Sig Start Date End Date Taking? Authorizing Provider  aspirin 81 MG EC tablet Take 1 tablet (81 mg total) by mouth daily. For heart health 11/21/17   Lindell Spar I, NP  atorvastatin (LIPITOR) 10 MG tablet Take 10 mg by mouth daily. 12/16/19   [provider]  carvedilol (COREG) 12.5 MG tablet Take 12.5 mg by mouth 2 (two) times daily with a meal. 10/25/18 12/27/19  [provider]  chlorproMAZINE (THORAZINE) 200 MG tablet Take 600 mg by mouth at bedtime. 11/17/19   [provider]  clopidogrel (PLAVIX) 75 MG tablet Take 75 mg by mouth daily. 12/16/19   [provider]  escitalopram (LEXAPRO) 20 MG tablet Take 20 mg by mouth daily.    [provider]  lisinopril (ZESTRIL) 10 MG tablet Take 10 mg by mouth daily. 12/16/19   [provider]  metFORMIN (GLUCOPHAGE) 1000 MG tablet Take 1,000 mg by mouth 2 (two) times daily. 10/02/18   [provider]  mirtazapine (REMERON) 45 MG tablet Take 45 mg by mouth at bedtime.    [provider]     Allergies Prednisone, Amoxicillin, Hydrocodone, Varenicline, and Trazodone and nefazodone  Family History  Problem Relation Age of Onset  . Prostate cancer Father   . Hypertension Mother   . Kidney  Stones Mother   . Anxiety disorder Mother   . Depression Mother   . COPD Sister   . Hypertension Sister   . Diabetes Sister   . Depression Sister   . Anxiety disorder Sister   . Seizures Sister   . ADD / ADHD Son   . ADD / ADHD Daughter     Social History Social History   Tobacco Use  . Smoking status: Current Every Day Smoker    Packs/day: 2.00    Years: 17.00    Pack years: 34.00    Types: Cigarettes, Cigars  . Smokeless tobacco: Former Systems developer  . Tobacco comment: occassional snuff   Vaping Use  . Vaping Use: Never used  Substance Use Topics  . Alcohol use: No    Alcohol/week: 0.0 standard drinks    Comment: last drinked in 3 months.   . Drug use: No    Review of Systems  Constitutional: No fever/chills Eyes: No visual changes.  ENT: No sore throat. Cardiovascular: Denies chest pain. Respiratory: Denies shortness of breath. Gastrointestinal: No abdominal pain.   Genitourinary: Negative for dysuria. Musculoskeletal: Negative for back pain. Skin: Negative for rash. Neurological: Negative for headaches or weakness   ____________________________________________   PHYSICAL EXAM:  VITAL SIGNS: ED Triage  Vitals  Enc Vitals Group     BP 08/13/20 1228 (!) 154/96     Pulse Rate 08/13/20 1228 87     Resp 08/13/20 1228 16     Temp 08/13/20 1228 98 F (36.7 C)     Temp Source 08/13/20 1228 Oral     SpO2 08/13/20 1228 96 %     Weight --      Height --      Head Circumference --      Peak Flow --      Pain Score 08/13/20 1220 0     Pain Loc --      Pain Edu? --      Excl. in [place]? --     Constitutional: Alert and oriented.   Nose: No congestion/rhinnorhea. Mouth/Throat: Mucous membranes are moist.   Neck:  Painless ROM Cardiovascular: Normal rate, regular rhythm. Grossly normal heart sounds.  Good peripheral circulation. Respiratory: Normal respiratory effort.  No retractions. Lungs CTAB. Gastrointestinal: Soft and nontender. No distention.  No CVA  tenderness. Genitourinary: deferred Musculoskeletal: No lower extremity tenderness nor edema.  Neurologic:  Normal speech and language. No gross focal neurologic deficits are appreciated.  Skin:  Skin is warm, dry and intact. No rash noted. Psychiatric: Mood and affect are normal. Speech and behavior are normal.  ____________________________________________   LABS (all labs ordered are listed, but only abnormal results are displayed)  Labs Reviewed - No data to display ____________________________________________  EKG  None ____________________________________________  RADIOLOGY  None ____________________________________________   PROCEDURES  Procedure(s) performed: No  Procedures   Critical Care performed: No ____________________________________________   INITIAL IMPRESSION / ASSESSMENT AND PLAN / ED COURSE  Pertinent labs & imaging results that were available during my care of the patient were reviewed by me and considered in my medical decision making (see chart for details).  Patient is at his baseline, no indication for labs or additional work-up.  We will give 2 mg of IM Ativan to hopefully calm him  [name] has agreed to see him again for his chronic suicidal ideation    ____________________________________________   FINAL CLINICAL IMPRESSION(S) / ED DIAGNOSES  Final diagnoses:  Suicidal ideation        Note:  This document was prepared using Dragon voice recognition software and may include unintentional dictation errors.   Lavonia Drafts, MD 08/13/20 [phone]

## 2020-08-13 NOTE — ED Triage Notes (Signed)
Pt arrives from [facility] via [place] [employer]. Pt states he is here bc he is "suicidal" and has "been without meds" since the 12th. Pt states he was planning to "take a hammer to my head." Pt is AOX4, NAD noted. Police officer states he was banging his head and yelling on PD arrival, has been calm and cooperative en route.

## 2020-08-13 NOTE — ED Notes (Signed)
Pt states "can I get something to calm me down?" Pt informed by EDP he may have PO medication, pt states "why not a shot?" EDP at bedside explaining plan of care.

## 2020-08-13 NOTE — ED Notes (Signed)
Pt eating

## 2020-08-13 NOTE — ED Notes (Signed)
Pt yelling "help!" Pt told not to yell, states "I need help. I want a psychiatrist." Pt informed of plan of care and that psychiatrist will see him. Pt continues yelling for help. Security aware.

## 2020-08-13 NOTE — ED Notes (Signed)
Pt calling sister, belongings given back to pt

## 2020-08-13 NOTE — ED Notes (Signed)
Pt laying on floor banging head against wall and yelling. MD notified, will medicate.

## 2020-08-15 NOTE — Consult Note (Signed)
[place] Psychiatry Consult   Reason for Consult:   Consult for this 50 year old man well known to [place] who was brought to the emergency room under IVC filed at [facility] Referring Physician: Corky Downs Patient Identification: Michael Schmitt MRN:  [id number] Principal Diagnosis: Personality disorder [facility]) Diagnosis:  Principal Problem:   Personality disorder ([place]) Active Problems:   Chronic Suicidal ideation   Generalized anxiety disorder   Total Time spent with patient: 1 hour  Subjective:   [name] is a 50 y.o. male patient admitted with "help me".  HPI:   Patient seen chart reviewed.  Patient is well known from many previous encounters.  This is a 50 year old man with chronic personality disorder and behavior issues with chronic complaints of depression and anxiety.  He had gone to [facility] for an intake appointment but had told them about his chronic suicidal ideations so they filed commitment papers.  In the emergency room the patient came in initially cooperative but then began showing his usual behaviors in the emergency room of shouting loudly to get attention with very dramatic presentation very immature behavior.  Banged his head at one point.  No other self-injury.  Denies that he has been using drugs or alcohol.  Patient has not been cooperative with all the longstanding recommendations of outpatient mental health treatment for him.  He denies any intent to kill himself denies any homicidal ideation.  Denies any psychotic symptoms.  Past Psychiatric History: Patient has a long history of chronic passive suicidal ideation.  Some past history of self-injury.  Long history of noncompliance with recommended treatment.  Tendency to abuse and become dependent on benzodiazepines.  Risk to Self:   Risk to Others:   Prior Inpatient Therapy:   Prior Outpatient Therapy:    Past Medical History:  Past Medical History:  Diagnosis Date  . Anxiety   . Anxiety   . Arthritis     cerv. stenosis, spondylosis, HNP- lower back , has been followed in pain clinic, has  had injection s in cerv. area  . Blood dyscrasia    told that when he was young he was a" free bleeder"  . CAD (coronary artery disease)   . Cervical spondylosis without myelopathy 07/24/2014  . Cervicogenic headache 07/24/2014  . Chronic kidney disease    renal calculi- passed spontaneously  . Depression   . Diabetes mellitus without complication ([place])   . Fatty liver   . GERD (gastroesophageal reflux disease)   . Headache(784.0)   . Hyperlipidemia   . Hypertension   . 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 Mental disorder   . MI, old   . RLS (restless legs syndrome)    detected on sleep study  . Shortness of breath   . Ventricular fibrillation ([place]) 04/05/2016    Past Surgical History:  Procedure Laterality Date  . ANTERIOR CERVICAL DECOMP/DISCECTOMY FUSION  11/13/2011   Procedure: ANTERIOR CERVICAL DECOMPRESSION/DISCECTOMY FUSION 1 LEVEL;  Surgeon: [name], MD;  Location: [place] NEURO ORS;  Service: Neurosurgery;  Laterality: N/A;  Anterior Cervical Decompression/discectomy Fusion. Cervical three-four.  Marland Kitchen CARDIAC CATHETERIZATION N/A 01/01/2015   Procedure: Left Heart Cath and Coronary Angiography;  Surgeon: [name], MD;  Location: [place] CV LAB;  Service: Cardiovascular;  Laterality: N/A;  . CARDIAC CATHETERIZATION N/A 04/05/2016   Procedure: Left Heart Cath and Coronary Angiography;  Surgeon: Belva Crome, MD;  Location: [place] CV LAB;  Service:  Cardiovascular;  Laterality: N/A;  . CARDIAC CATHETERIZATION N/A 04/05/2016   Procedure: Coronary Stent Intervention;  Surgeon: Belva Crome, MD;  Location: [place] CV LAB;  Service: Cardiovascular;  Laterality: N/A;  . CARDIAC CATHETERIZATION N/A 04/05/2016   Procedure: Intravascular Ultrasound/IVUS;  Surgeon: Belva Crome, MD;  Location: [place] CV LAB;  Service: Cardiovascular;  Laterality: N/A;   . NASAL SINUS SURGERY     2005   Family History:  Family History  Problem Relation Age of Onset  . Prostate cancer Father   . Hypertension Mother   . Kidney Stones Mother   . Anxiety disorder Mother   . Depression Mother   . COPD Sister   . Hypertension Sister   . Diabetes Sister   . Depression Sister   . Anxiety disorder Sister   . Seizures Sister   . ADD / ADHD Son   . ADD / ADHD Daughter    Family Psychiatric  History: None Social History:  Social History   Substance and Sexual Activity  Alcohol Use No  . Alcohol/week: 0.0 standard drinks   Comment: last drinked in 3 months.      Social History   Substance and Sexual Activity  Drug Use No    Social History   Socioeconomic History  . Marital status: Single    Spouse name: Not on file  . Number of children: 3  . Years of education: 31  . Highest education level: Not on file  Occupational History    Comment: unemployed  Tobacco Use  . Smoking status: Current Every Day Smoker    Packs/day: 2.00    Years: 17.00    Pack years: 34.00    Types: Cigarettes, Cigars  . Smokeless tobacco: Former Systems developer  . Tobacco comment: occassional snuff   Vaping Use  . Vaping Use: Never used  Substance and Sexual Activity  . Alcohol use: No    Alcohol/week: 0.0 standard drinks    Comment: last drinked in 3 months.   . Drug use: No  . Sexual activity: Not Currently  Other Topics Concern  . Not on file  Social History Narrative   Patient is right handed.   Patient drinks 2 sodas daily.   Social Determinants of Health   Financial Resource Strain: Not on file  Food Insecurity: Not on file  Transportation Needs: Not on file  Physical Activity: Not on file  Stress: Not on file  Social Connections: Not on file   Additional Social History:    Allergies:   Allergies  Allergen Reactions  . Prednisone Other (See Comments)    Hypertension and makes him feel "spacey"  . Amoxicillin Other (See Comments)    Other  Reaction: ELEVATED BP  . Hydrocodone Other (See Comments)    Causes headaches and makes the patient irritable  . Varenicline Other (See Comments)    Suicidal thoughts  . Trazodone And Nefazodone Anxiety and Other (See Comments)    Patient stated that this keeps him awake; "has an opposite effect on me"    Labs: No results found for this or any previous visit (from the past 48 hour(s)).  No current facility-administered medications for this encounter.   Current Outpatient Medications  Medication Sig Dispense Refill  . QUEtiapine (SEROQUEL) 200 MG tablet Take 1 tablet (200 mg total) by mouth at bedtime. 30 tablet 1  . venlafaxine XR (EFFEXOR XR) 75 MG 24 hr capsule Take 1 capsule (75 mg total) by mouth daily. 30 capsule  1  . aspirin 81 MG EC tablet Take 1 tablet (81 mg total) by mouth daily. For heart health 30 tablet 1  . atorvastatin (LIPITOR) 10 MG tablet Take 10 mg by mouth daily.    . carvedilol (COREG) 12.5 MG tablet Take 12.5 mg by mouth 2 (two) times daily with a meal.    . chlorproMAZINE (THORAZINE) 200 MG tablet Take 600 mg by mouth at bedtime.    . clopidogrel (PLAVIX) 75 MG tablet Take 75 mg by mouth daily.    Marland Kitchen lisinopril (ZESTRIL) 10 MG tablet Take 10 mg by mouth daily.    . metFORMIN (GLUCOPHAGE) 1000 MG tablet Take 1,000 mg by mouth 2 (two) times daily.      Musculoskeletal: Strength & Muscle Tone: within normal limits Gait & Station: normal Patient leans: N/A            Psychiatric Specialty Exam:  Presentation  General Appearance: No data recorded Eye Contact:No data recorded Speech:No data recorded Speech Volume:No data recorded Handedness:No data recorded  Mood and Affect  Mood:No data recorded Affect:No data recorded  Thought Process  Thought Processes:No data recorded Descriptions of Associations:No data recorded Orientation:No data recorded Thought Content:No data recorded History of Schizophrenia/Schizoaffective disorder:No data  recorded Duration of Psychotic Symptoms:No data recorded Hallucinations:No data recorded Ideas of Reference:No data recorded Suicidal Thoughts:No data recorded Homicidal Thoughts:No data recorded  Sensorium  Memory:No data recorded Judgment:No data recorded Insight:No data recorded  Executive Functions  Concentration:No data recorded Attention Span:No data recorded Recall:No data recorded Fund of Knowledge:No data recorded Language:No data recorded  Psychomotor Activity  Psychomotor Activity:No data recorded  Assets  Assets:No data recorded  Sleep  Sleep:No data recorded  Physical Exam: Physical Exam Nursing note reviewed.  Constitutional:      Appearance: Normal appearance.  HENT:     Head: Normocephalic and atraumatic.     Mouth/Throat:     Pharynx: Oropharynx is clear.  Eyes:     Pupils: Pupils are equal, round, and reactive to light.  Cardiovascular:     Rate and Rhythm: Normal rate and regular rhythm.  Pulmonary:     Effort: Pulmonary effort is normal.     Breath sounds: Normal breath sounds.  Abdominal:     General: Abdomen is flat.     Palpations: Abdomen is soft.  Musculoskeletal:        General: Normal range of motion.  Skin:    General: Skin is warm and dry.  Neurological:     General: No focal deficit present.     Mental Status: He is alert. Mental status is at baseline.  Psychiatric:        Attention and Perception: He is inattentive.        Mood and Affect: Mood is anxious. Affect is inappropriate.        Speech: Speech normal.        Behavior: Behavior is agitated. Behavior is not aggressive.        Thought Content: Thought content includes suicidal ideation. Thought content does not include suicidal plan.        Cognition and Memory: Cognition is impaired.        Judgment: Judgment is impulsive.    ROS Blood pressure (!) 154/102, pulse 100, temperature 98 F (36.7 C), temperature source Oral, resp. rate 17, SpO2 95 %. There is no height  or weight on file to calculate BMI.  Treatment Plan Summary: Plan  Supportive counseling and review of overall treatment recommendations.  Patient is well known to [place] and does not benefit from inpatient hospitalization.  Tends to decompensate worse when he is in the hospital or in the presence of providers.  Lots of acting out behavior often apparently with the intention of being prescribed particular medicine.  I gave the patient support and encouragement to continue with outpatient treatment however at this point he is not a good candidate for hospital admission.  Discontinued IVC.  Case reviewed with emergency room physician.  Disposition: No evidence of imminent risk to self or others at present.   Patient does not meet criteria for psychiatric inpatient admission. Supportive therapy provided about ongoing stressors. Discussed crisis plan, support from social network, calling 911, coming to the Emergency Department, and calling Suicide Hotline.  Alethia Berthold, MD 08/13/20 2:07 PM

## 2020-08-21 ENCOUNTER — Encounter (HOSPITAL_COMMUNITY): Payer: Self-pay | Admitting: Emergency Medicine

## 2020-08-21 ENCOUNTER — Emergency Department (HOSPITAL_COMMUNITY)
Admission: EM | Admit: 2020-08-21 | Discharge: 2020-08-22 | Disposition: A | Discharge status: Home / Self Care | Payer: Medicare Other | Attending: Emergency Medicine | Admitting: Emergency Medicine

## 2020-08-21 ENCOUNTER — Other Ambulatory Visit: Payer: Self-pay

## 2020-08-21 DIAGNOSIS — R451 Restlessness and agitation: Secondary | ICD-10-CM | POA: Diagnosis present

## 2020-08-21 DIAGNOSIS — N189 Chronic kidney disease, unspecified: Secondary | ICD-10-CM | POA: Diagnosis not present

## 2020-08-21 DIAGNOSIS — F5104 Psychophysiologic insomnia: Secondary | ICD-10-CM | POA: Diagnosis present

## 2020-08-21 DIAGNOSIS — Z7902 Long term (current) use of antithrombotics/antiplatelets: Secondary | ICD-10-CM | POA: Diagnosis not present

## 2020-08-21 DIAGNOSIS — R45851 Suicidal ideations: Secondary | ICD-10-CM

## 2020-08-21 DIAGNOSIS — Z7984 Long term (current) use of oral hypoglycemic drugs: Secondary | ICD-10-CM | POA: Diagnosis not present

## 2020-08-21 DIAGNOSIS — E1122 Type 2 diabetes mellitus with diabetic chronic kidney disease: Secondary | ICD-10-CM | POA: Insufficient documentation

## 2020-08-21 DIAGNOSIS — Z79899 Other long term (current) drug therapy: Secondary | ICD-10-CM | POA: Insufficient documentation

## 2020-08-21 DIAGNOSIS — G4700 Insomnia, unspecified: Secondary | ICD-10-CM | POA: Diagnosis not present

## 2020-08-21 DIAGNOSIS — F411 Generalized anxiety disorder: Secondary | ICD-10-CM | POA: Diagnosis present

## 2020-08-21 DIAGNOSIS — Z20822 Contact with and (suspected) exposure to covid-19: Secondary | ICD-10-CM | POA: Insufficient documentation

## 2020-08-21 DIAGNOSIS — F17210 Nicotine dependence, cigarettes, uncomplicated: Secondary | ICD-10-CM | POA: Diagnosis not present

## 2020-08-21 DIAGNOSIS — J45909 Unspecified asthma, uncomplicated: Secondary | ICD-10-CM | POA: Insufficient documentation

## 2020-08-21 DIAGNOSIS — I129 Hypertensive chronic kidney disease with stage 1 through stage 4 chronic kidney disease, or unspecified chronic kidney disease: Secondary | ICD-10-CM | POA: Diagnosis not present

## 2020-08-21 DIAGNOSIS — Z7982 Long term (current) use of aspirin: Secondary | ICD-10-CM | POA: Diagnosis not present

## 2020-08-21 DIAGNOSIS — I2510 Atherosclerotic heart disease of native coronary artery without angina pectoris: Secondary | ICD-10-CM | POA: Diagnosis not present

## 2020-08-21 DIAGNOSIS — F1014 Alcohol abuse with alcohol-induced mood disorder: Secondary | ICD-10-CM | POA: Diagnosis present

## 2020-08-21 LAB — COMPREHENSIVE METABOLIC PANEL
ALT: 102 U/L — ABNORMAL HIGH (ref 0–44)
AST: 52 U/L — ABNORMAL HIGH (ref 15–41)
Albumin: 3.8 g/dL (ref 3.5–5.0)
Alkaline Phosphatase: 128 U/L — ABNORMAL HIGH (ref 38–126)
Anion gap: 13 (ref 5–15)
BUN: 5 mg/dL — ABNORMAL LOW (ref 6–20)
CO2: 24 mmol/L (ref 22–32)
Calcium: 9.9 mg/dL (ref 8.9–10.3)
Chloride: 96 mmol/L — ABNORMAL LOW (ref 98–111)
Creatinine, Ser: 0.93 mg/dL (ref 0.61–1.24)
GFR, Estimated: >60 mL/min (ref >60)
Glucose, Bld: 148 mg/dL — ABNORMAL HIGH (ref 70–99)
Potassium: 3.9 mmol/L (ref 3.5–5.1)
Sodium: 133 mmol/L — ABNORMAL LOW (ref 135–145)
Total Bilirubin: 0.6 mg/dL (ref 0.3–1.2)
Total Protein: 6.9 g/dL (ref 6.5–8.1)

## 2020-08-21 LAB — CBC
HCT: 44.3 % (ref 39.0–52.0)
Hemoglobin: 14.4 g/dL (ref 13.0–17.0)
MCH: 29 pg (ref 26.0–34.0)
MCHC: 32.5 g/dL (ref 30.0–36.0)
MCV: 89.1 fL (ref 80.0–100.0)
Platelets: 374 10*3/uL (ref 150–400)
RBC: 4.97 MIL/uL (ref 4.22–5.81)
RDW: 14.3 % (ref 11.5–15.5)
WBC: 10.3 10*3/uL (ref 4.0–10.5)
nRBC: 0 % (ref 0.0–0.2)

## 2020-08-21 LAB — SALICYLATE LEVEL: Salicylate Lvl: <7 mg/dL — ABNORMAL LOW (ref 7.0–30.0)

## 2020-08-21 LAB — ACETAMINOPHEN LEVEL: Acetaminophen (Tylenol), Serum: <10 ug/mL — ABNORMAL LOW (ref 10–30)

## 2020-08-21 LAB — ETHANOL: Alcohol, Ethyl (B): <10 mg/dL (ref <10)

## 2020-08-21 MED ORDER — ACETAMINOPHEN 500 MG PO TABS
1000.0000 mg | ORAL_TABLET | Freq: Once | ORAL | Status: AC
  Administered 2020-08-21: 1000 mg via ORAL
  Filled 2020-08-21: qty 2

## 2020-08-21 MED ORDER — ASENAPINE MALEATE 5 MG SL SUBL
10.0000 mg | SUBLINGUAL_TABLET | Freq: Once | SUBLINGUAL | Status: AC
  Administered 2020-08-21: 10 mg via SUBLINGUAL
  Filled 2020-08-21: qty 2

## 2020-08-21 MED ORDER — LORAZEPAM 1 MG PO TABS: 1.0000 mg | ORAL_TABLET | ORAL | Status: DC | PRN

## 2020-08-21 MED ORDER — ASENAPINE MALEATE 5 MG SL SUBL
10.0000 mg | SUBLINGUAL_TABLET | Freq: Once | SUBLINGUAL | Status: DC
  Filled 2020-08-21: qty 2

## 2020-08-21 MED ORDER — OLANZAPINE 5 MG PO TBDP
5.0000 mg | ORAL_TABLET | Freq: Three times a day (TID) | ORAL | Status: DC | PRN
  Administered 2020-08-21 – 2020-08-22 (×3): 5 mg via ORAL
  Filled 2020-08-21 (×4): qty 1

## 2020-08-21 MED ORDER — ZIPRASIDONE MESYLATE 20 MG IM SOLR
20.0000 mg | INTRAMUSCULAR | Status: AC | PRN
  Administered 2020-08-21: 20 mg via INTRAMUSCULAR
  Filled 2020-08-21: qty 20

## 2020-08-21 NOTE — ED Notes (Signed)
Pt continues to act out  Actuary and security remain at  The bedside

## 2020-08-21 NOTE — BH Assessment (Signed)
16:02 Writer sent secure chat to Medical team to see if patient was ready and cart could be prepared for  TTS consult. TTS has not received a response.

## 2020-08-21 NOTE — ED Notes (Signed)
Sister Derrian Huettner [phone] would like an update

## 2020-08-21 NOTE — ED Triage Notes (Addendum)
Pt having screaming outburst since checking in.  Reports being suicidal with a plan to "take a hammer to my head."  Pt balling up fist and yelling "Help".  Wants to know when he is going to a room.

## 2020-08-21 NOTE — ED Notes (Signed)
[name] called to check on Michael Schmitt's stat and to said her is going home

## 2020-08-21 NOTE — ED Notes (Signed)
Pt moved to a room  tts machine at  The bedside

## 2020-08-21 NOTE — ED Notes (Signed)
TTS in progress 
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
## 2020-08-21 NOTE — ED Notes (Signed)
Outbursts have become less frequent for now
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
## 2020-08-21 NOTE — ED Notes (Signed)
Pt still has outbursts  More controlled in the room.  He is waiting for tts
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
## 2020-08-21 NOTE — ED Notes (Signed)
Pt continues to yell and scream "help". Security at bedside

## 2020-08-22 DIAGNOSIS — G4700 Insomnia, unspecified: Secondary | ICD-10-CM | POA: Diagnosis not present

## 2020-08-22 DIAGNOSIS — F5104 Psychophysiologic insomnia: Secondary | ICD-10-CM | POA: Diagnosis present

## 2020-08-22 LAB — RESP PANEL BY RT-PCR (FLU A&B, COVID) ARPGX2
Influenza A by PCR: NEGATIVE
Influenza B by PCR: NEGATIVE
SARS Coronavirus 2 by RT PCR: NEGATIVE

## 2020-08-22 MED ORDER — BACLOFEN 10 MG PO TABS
10.0000 mg | ORAL_TABLET | Freq: Three times a day (TID) | ORAL | Status: DC
  Administered 2020-08-22: 10 mg via ORAL
  Filled 2020-08-22: qty 1

## 2020-08-22 MED ORDER — QUETIAPINE FUMARATE 200 MG PO TABS: 600.0000 mg | ORAL_TABLET | Freq: Every day | ORAL | Status: DC

## 2020-08-22 MED ORDER — ALBUTEROL SULFATE HFA 108 (90 BASE) MCG/ACT IN AERS: 2.0000 | INHALATION_SPRAY | Freq: Four times a day (QID) | RESPIRATORY_TRACT | Status: DC | PRN

## 2020-08-22 MED ORDER — HYDROXYZINE HCL 50 MG PO TABS
100.0000 mg | ORAL_TABLET | Freq: Every day | ORAL | Status: DC
  Filled 2020-08-22: qty 2

## 2020-08-22 MED ORDER — HALOPERIDOL LACTATE 5 MG/ML IJ SOLN: 10.0000 mg | Freq: Two times a day (BID) | INTRAMUSCULAR | Status: DC | PRN

## 2020-08-22 MED ORDER — CARVEDILOL 3.125 MG PO TABS
6.2500 mg | ORAL_TABLET | Freq: Two times a day (BID) | ORAL | Status: DC
  Administered 2020-08-22: 6.25 mg via ORAL
  Filled 2020-08-22: qty 2

## 2020-08-22 MED ORDER — CLOPIDOGREL BISULFATE 75 MG PO TABS
75.0000 mg | ORAL_TABLET | Freq: Every day | ORAL | Status: DC
  Administered 2020-08-22: 75 mg via ORAL
  Filled 2020-08-22: qty 1

## 2020-08-22 MED ORDER — DIPHENHYDRAMINE HCL 50 MG/ML IJ SOLN: 50.0000 mg | Freq: Two times a day (BID) | INTRAMUSCULAR | Status: DC | PRN

## 2020-08-22 MED ORDER — VENLAFAXINE HCL ER 75 MG PO CP24
75.0000 mg | ORAL_CAPSULE | Freq: Every day | ORAL | Status: DC
  Administered 2020-08-22: 75 mg via ORAL
  Filled 2020-08-22: qty 1

## 2020-08-22 MED ORDER — ACETAMINOPHEN 325 MG PO TABS
650.0000 mg | ORAL_TABLET | Freq: Once | ORAL | Status: AC
  Administered 2020-08-22: 650 mg via ORAL
  Filled 2020-08-22: qty 2

## 2020-08-22 NOTE — Discharge Instructions (Signed)
Psychiatry team have contacted our outpatient pharmacy for a refill of your Seroquel for the next 3 days.  Please follow-up with your provider for further managements of your mental health.

## 2020-08-22 NOTE — ED Notes (Signed)
Per behavioral health NP, pt is psych cleared at this time. Will notify provider.

## 2020-08-22 NOTE — ED Notes (Addendum)
Per Harrie Foreman and Melina Copa MD, okay to DC pt w/ uber arranged by SW. SW arranging uber now. Pt belongings returned to pt

## 2020-08-22 NOTE — ED Notes (Signed)
Patient continues to yell, stands on bed, pulls mattress off bed and puts on floor.  Patient has walked out back door once and redirectable back into the ED.  Patient has been pushing the bed around the room.

## 2020-08-22 NOTE — Consult Note (Addendum)
Telepsych Consultation   Reason for Consult:  Suicidal Ideatios Referring Physician:   Location of Patient:   [facility] Location of Provider: Other: virtual home office  Patient Identification: Michael Schmitt MRN:  [id number] Principal Diagnosis: Insomnia Diagnosis:  Principal Problem:   Insomnia Active Problems:   Chronic Suicidal ideation   Generalized anxiety disorder   Alcohol abuse with alcohol-induced mood disorder ([place])   Total Time spent with patient: 30 minutes  Subjective:   Michael Schmitt is a 50 y.o. male patient admitted with suicidal ideations.  Today the patient tells me," I'd be better if I had some sleep. "  Michael Schmitt, 50 y.o., male patient presented to [facility] emergency department with suicidal ideations.  Patient seen via telepsych by this provider; chart reviewed and consulted with Dr. Dwyane Dee on 08/22/20.  On evaluation Michael Schmitt patient is sitting on the hospital bed, no shirt and no sheets on his bed.  He has an involuntary head movement but states this is chronic.  He is alert and oriented to person, place, time and current situation.  Reports, difficulty sleeping x3 days so decided to come to the ED for med.  States he usually takes seroquel '600mg'$  qhs for sleep, ran out of this medication and has not been able to get refills.  He tells me he uses [facility], they are open on Sundays until 1300 and requests med called in for him.  He endorses suicidal ideation, triggered by his inability to sleep but  no longer relates a plan today.  He resides with his mother, sister, two teen age children whom he report as very supportive of him and serve as protective factors against suicide.  On admission, patient was agitated, yelling and damaging hospital property, requesting medications for sleep, pain.  He was given several prn medications at different intervals, geodon, olanzapine and saphris.  Since then, the  patient was able to rest.  Today he continues to request seroquel but is no longer yelling, damaging property and can be verbally redirected. He is future oriented and relates his plan to follow-up with his outpatient mental health provider, [facility] in [place] on "May 31st at 2pm."     Per records review, the patient has hx for Alcohol Use Disorder, Personality Disorder, Depressive Disorder and other polysubstance abuse dependence, usually has chronic suicidal ideations likely induced by multiple substances and non-adherence to psychiatric treatment.  He was recently seen at [facility] emergency department with similar presentation one week prior.  Yelling and behaving inappropriately in the ED. At that time, [name] did not feel he would benefit from inpatient treatment, due to hx of decompensating behaviors when admitted.  Patient was discharged and recommended for follow-up with outpatient mental health team.  HPI {Per EDP Assessment Notes dated 08/21/2020 Chief Complaint  Patient presents with  . Suicidal   Michael Schmitt is a 50 y.o. male with a pmh of polysubstance abuse, benzodiazepine use, chronic personality disorder, behavioral disorder.  Patient states that he feels agitated and suicidal and wants to be admitted to the hospital.  He has been out of his Klonopin for several days and states that he just need something to calm him down.  He is screaming in the hallway.  He was seen and assessed on 520 for the same issue and at that time was felt to be inappropriate for inpatient hospitalization as he " does not benefit form inpatient hospitalization" per psychiatric  nurse.  He apparently has a history of acting out to get specific medications in the past.  He does endorse "seeing spots" but denies hearing other auditory hallucinations.  He denies homicidal ideation.  He denies alcohol or drug abuse  Past Psychiatric History: Polysubstance abuse, benzodiazepine use,  chronic personality disorder, behavioral disorder  Risk to Self:  no Risk to Others:   Prior Inpatient Therapy:   Prior Outpatient Therapy:  yes  Past Medical History:  Past Medical History:  Diagnosis Date  . Anxiety   . Anxiety   . Arthritis    cerv. stenosis, spondylosis, HNP- lower back , has been followed in pain clinic, has  had injection s in cerv. area  . Blood dyscrasia    told that when he was young he was a" free bleeder"  . CAD (coronary artery disease)   . Cervical spondylosis without myelopathy 07/24/2014  . Cervicogenic headache 07/24/2014  . Chronic kidney disease    renal calculi- passed spontaneously  . Depression   . Diabetes mellitus without complication ([place])   . Fatty liver   . GERD (gastroesophageal reflux disease)   . Headache(784.0)   . Hyperlipidemia   . Hypertension   . 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 Mental disorder   . MI, old   . RLS (restless legs syndrome)    detected on sleep study  . Shortness of breath   . Ventricular fibrillation ([place]) 04/05/2016    Past Surgical History:  Procedure Laterality Date  . ANTERIOR CERVICAL DECOMP/DISCECTOMY FUSION  11/13/2011   Procedure: ANTERIOR CERVICAL DECOMPRESSION/DISCECTOMY FUSION 1 LEVEL;  Surgeon: [name], MD;  Location: [facility];  Service: Neurosurgery;  Laterality: N/A;  Anterior Cervical Decompression/discectomy Fusion. Cervical three-four.  Marland Kitchen CARDIAC CATHETERIZATION N/A 01/01/2015   Procedure: Left Heart Cath and Coronary Angiography;  Surgeon: [name], MD;  Location: [facility];  Service: Cardiovascular;  Laterality: N/A;  . CARDIAC CATHETERIZATION N/A 04/05/2016   Procedure: Left Heart Cath and Coronary Angiography;  Surgeon: Belva Crome, MD;  Location: [facility];  Service: Cardiovascular;  Laterality: N/A;  . CARDIAC CATHETERIZATION N/A 04/05/2016   Procedure: Coronary Stent Intervention;  Surgeon: Belva Crome, MD;   Location: [facility];  Service: Cardiovascular;  Laterality: N/A;  . CARDIAC CATHETERIZATION N/A 04/05/2016   Procedure: Intravascular Ultrasound/IVUS;  Surgeon: Belva Crome, MD;  Location: [facility];  Service: Cardiovascular;  Laterality: N/A;  . NASAL SINUS SURGERY     2005   Family History:  Family History  Problem Relation Age of Onset  . Prostate cancer Father   . Hypertension Mother   . Kidney Stones Mother   . Anxiety disorder Mother   . Depression Mother   . COPD Sister   . Hypertension Sister   . Diabetes Sister   . Depression Sister   . Anxiety disorder Sister   . Seizures Sister   . ADD / ADHD Son   . ADD / ADHD Daughter    Family Psychiatric  History:  Social History:  Social History   Substance and Sexual Activity  Alcohol Use No  . Alcohol/week: 0.0 standard drinks   Comment: last drinked in 3 months.      Social History   Substance and Sexual Activity  Drug Use No    Social History   Socioeconomic History  . Marital status: Single  Spouse name: Not on file  . Number of children: 3  . Years of education: 30  . Highest education level: Not on file  Occupational History    Comment: unemployed  Tobacco Use  . Smoking status: Current Every Day Smoker    Packs/day: 2.00    Years: 17.00    Pack years: 34.00    Types: Cigarettes, Cigars  . Smokeless tobacco: Former Systems developer  . Tobacco comment: occassional snuff   Vaping Use  . Vaping Use: Never used  Substance and Sexual Activity  . Alcohol use: No    Alcohol/week: 0.0 standard drinks    Comment: last drinked in 3 months.   . Drug use: No  . Sexual activity: Not Currently  Other Topics Concern  . Not on file  Social History Narrative   Patient is right handed.   Patient drinks 2 sodas daily.   Social Determinants of Health   Financial Resource Strain: Not on file  Food Insecurity: Not on file  Transportation Needs: Not on file  Physical Activity: Not on file  Stress:  Not on file  Social Connections: Not on file   Additional Social History:    Allergies:   Allergies  Allergen Reactions  . Prednisone Other (See Comments)    Hypertension and makes him feel "spacey"  . Amoxicillin Other (See Comments)    Other Reaction: ELEVATED BP  . Hydrocodone Other (See Comments)    Causes headaches and makes the patient irritable  . Varenicline Other (See Comments)    Suicidal thoughts  . Other Anxiety    Other reaction(s): Other (See Comments) Patient stated that this keeps him awake; "has an opposite effect on me"  . Trazodone And Nefazodone Anxiety and Other (See Comments)    Patient stated that this keeps him awake; "has an opposite effect on me"    Labs:  Results for orders placed or performed during the hospital encounter of 08/21/20 (from the past 48 hour(s))  Comprehensive metabolic panel     Status: Abnormal   Collection Time: 08/21/20  2:08 PM  Result Value Ref Range   Sodium 133 (L) 135 - 145 mmol/L   Potassium 3.9 3.5 - 5.1 mmol/L   Chloride 96 (L) 98 - 111 mmol/L   CO2 24 22 - 32 mmol/L   Glucose, Bld 148 (H) 70 - 99 mg/dL    Comment: Glucose reference range applies only to samples taken after fasting for at least 8 hours.   BUN 5 (L) 6 - 20 mg/dL   Creatinine, Ser 0.93 0.61 - 1.24 mg/dL   Calcium 9.9 8.9 - 10.3 mg/dL   Total Protein 6.9 6.5 - 8.1 g/dL   Albumin 3.8 3.5 - 5.0 g/dL   AST 52 (H) 15 - 41 U/L   ALT 102 (H) 0 - 44 U/L   Alkaline Phosphatase 128 (H) 38 - 126 U/L   Total Bilirubin 0.6 0.3 - 1.2 mg/dL   GFR, Estimated >60 >60 mL/min    Comment: (NOTE) Calculated using the CKD-EPI Creatinine Equation (2021)    Anion gap 13 5 - 15    Comment: Performed at [facility] [address] [place], [place] [address]  Ethanol     Status: None   Collection Time: 08/21/20  2:08 PM  Result Value Ref Range   Alcohol, Ethyl (B) <10 <10 mg/dL    Comment: STAT (NOTE) Lowest detectable limit for serum alcohol is 10  mg/dL.  For medical purposes only.  Performed at [facility], [place] [address], [place], [place] [id number]   Salicylate level     Status: Abnormal   Collection Time: 08/21/20  2:08 PM  Result Value Ref Range   Salicylate Lvl [id number] (L) 7.0 - 30.0 mg/dL    Comment: Performed at [facility] [address], [place], [place] [address]  Acetaminophen level     Status: Abnormal   Collection Time: 08/21/20  2:08 PM  Result Value Ref Range   Acetaminophen (Tylenol), Serum <10 (L) 10 - 30 ug/mL    Comment: (NOTE) Therapeutic concentrations vary significantly. A range of 10-30 ug/mL  may be an effective concentration for many patients. However, some  are best treated at concentrations outside of this range. Acetaminophen concentrations >150 ug/mL at 4 hours after ingestion  and >50 ug/mL at 12 hours after ingestion are often associated with  toxic reactions.  Performed at [facility], [place] [address] [place], [place] [address]   cbc     Status: None   Collection Time: 08/21/20  2:08 PM  Result Value Ref Range   WBC 10.3 4.0 - 10.5 K/uL   RBC 4.97 4.22 - 5.81 MIL/uL   Hemoglobin 14.4 13.0 - 17.0 g/dL   HCT 44.3 39.0 - 52.0 %   MCV 89.1 80.0 - 100.0 fL   MCH 29.0 26.0 - 34.0 pg   MCHC 32.5 30.0 - 36.0 g/dL   RDW 14.3 11.5 - 15.5 %   Platelets 374 150 - 400 K/uL   nRBC 0.0 0.0 - 0.2 %    Comment: Performed at [facility], [place] [address], [place], [place] [address]  Resp Panel by RT-PCR (Flu A&B, Covid) Nasopharyngeal Swab     Status: None   Collection Time: 08/22/20  1:45 AM   Specimen: Nasopharyngeal Swab; Nasopharyngeal(NP) swabs in vial transport medium  Result Value Ref Range   SARS Coronavirus 2 by RT PCR NEGATIVE NEGATIVE    Comment: (NOTE) SARS-CoV-2 target nucleic acids are NOT DETECTED.  The SARS-CoV-2 RNA is generally detectable in upper respiratory specimens during the acute phase of infection. The lowest concentration of SARS-CoV-2 viral copies  this assay can detect is 138 copies/mL. A negative result does not preclude SARS-Cov-2 infection and should not be used as the sole basis for treatment or other patient management decisions. A negative result may occur with  improper specimen collection/handling, submission of specimen other than nasopharyngeal swab, presence of viral mutation(s) within the areas targeted by this assay, and inadequate number of viral copies(<138 copies/mL). A negative result must be combined with clinical observations, patient history, and epidemiological information. The expected result is Negative.  Fact Sheet for Patients:  EntrepreneurPulse.com.au  Fact Sheet for Healthcare Providers:  IncredibleEmployment.be  This test is no t yet approved or cleared by the [place] FDA and  has been authorized for detection and/or diagnosis of SARS-CoV-2 by FDA under an Emergency Use Authorization (EUA). This EUA will remain  in effect (meaning this test can be used) for the duration of the COVID-19 declaration under Section 564(b)(1) of the Act, 21 U.S.C.section 360bbb-3(b)(1), unless the authorization is terminated  or revoked sooner.       Influenza A by PCR NEGATIVE NEGATIVE   Influenza B by PCR NEGATIVE NEGATIVE    Comment: (NOTE) The Xpert Xpress SARS-CoV-2/FLU/RSV plus assay is intended as an aid in the diagnosis of influenza from Nasopharyngeal swab specimens and should not be used as a  sole basis for treatment. Nasal washings and aspirates are unacceptable for Xpert Xpress SARS-CoV-2/FLU/RSV testing.  Fact Sheet for Patients: EntrepreneurPulse.com.au  Fact Sheet for Healthcare Providers: IncredibleEmployment.be  This test is not yet approved or cleared by the [place] FDA and has been authorized for detection and/or diagnosis of SARS-CoV-2 by FDA under an Emergency Use Authorization (EUA). This EUA will  remain in effect (meaning this test can be used) for the duration of the COVID-19 declaration under Section 564(b)(1) of the Act, 21 U.S.C. section 360bbb-3(b)(1), unless the authorization is terminated or revoked.  Performed at [facility], [place] [address] [place], [place] [address]     Medications:  Current Facility-Administered Medications  Medication Dose Route Frequency Provider Last Rate Last Admin  . albuterol (VENTOLIN HFA) 108 (90 Base) MCG/ACT inhaler 2 puff  2 puff Inhalation Q6H PRN Domenic Moras, PA-C      . baclofen (LIORESAL) tablet 10 mg  10 mg Oral TID Domenic Moras, PA-C   10 mg at 08/22/20 0911  . carvedilol (COREG) tablet 6.25 mg  6.25 mg Oral BID WC Domenic Moras, PA-C   6.25 mg at 08/22/20 0911  . clopidogrel (PLAVIX) tablet 75 mg  75 mg Oral Daily Domenic Moras, PA-C   75 mg at 08/22/20 [id number]  . haloperidol lactate (HALDOL) injection 10 mg  10 mg Intramuscular BID PRN Mallie Darting, NP       And  . diphenhydrAMINE (BENADRYL) injection 50 mg  50 mg Intramuscular BID PRN Merlyn Lot E, NP      . hydrOXYzine (ATARAX/VISTARIL) tablet 100 mg  100 mg Oral QHS Domenic Moras, PA-C      . QUEtiapine (SEROQUEL) tablet 600 mg  600 mg Oral QHS Domenic Moras, PA-C      . venlafaxine XR (EFFEXOR-XR) 24 hr capsule 75 mg  75 mg Oral Daily Domenic Moras, PA-C   75 mg at 08/22/20 [id number]   Current Outpatient Medications  Medication Sig Dispense Refill  . albuterol (VENTOLIN HFA) 108 (90 Base) MCG/ACT inhaler Inhale 2 puffs into the lungs every 6 (six) hours as needed.    Marland Kitchen atorvastatin (LIPITOR) 20 MG tablet Take 20 mg by mouth daily.    . baclofen (LIORESAL) 10 MG tablet Take 10 mg by mouth 3 (three) times daily.    . carvedilol (COREG) 6.25 MG tablet Take 6.25 mg by mouth 2 (two) times daily with a meal.    . clopidogrel (PLAVIX) 75 MG tablet Take 75 mg by mouth daily.    Marland Kitchen gabapentin (NEURONTIN) 800 MG tablet Take 800 mg by mouth 3 (three) times daily.    . hydrOXYzine (VISTARIL)  100 MG capsule Take 100 mg by mouth at bedtime.    . INGREZZA 80 MG capsule Take 80 mg by mouth daily.    Marland Kitchen lisinopril (ZESTRIL) 10 MG tablet Take 10 mg by mouth daily.    . metFORMIN (GLUCOPHAGE) 1000 MG tablet Take 1,000 mg by mouth 2 (two) times daily.    . mirtazapine (REMERON) 15 MG tablet Take 7.5 mg by mouth at bedtime.    . pantoprazole (PROTONIX) 40 MG tablet Take 1 tablet by mouth daily.    . QUEtiapine (SEROQUEL) 300 MG tablet Take 600 mg by mouth at bedtime.    Marland Kitchen REXULTI 1 MG TABS tablet Take 1 mg by mouth daily.    Marland Kitchen rOPINIRole (REQUIP) 0.5 MG tablet Take 0.5 mg by mouth at bedtime.    Marland Kitchen venlafaxine XR (EFFEXOR XR)  75 MG 24 hr capsule Take 1 capsule (75 mg total) by mouth daily. 30 capsule 1  . aspirin 81 MG EC tablet Take 1 tablet (81 mg total) by mouth daily. For heart health (Patient not taking: No sig reported) 30 tablet 1  . chlorproMAZINE (THORAZINE) 200 MG tablet Take 600 mg by mouth at bedtime. (Patient not taking: Reported on 08/21/2020)    . clonazePAM (KLONOPIN) 1 MG tablet Take 0.5 mg by mouth 2 (two) times daily. (Patient not taking: 1 tablet (1 mg total). Reported on 08/21/2020)    . desvenlafaxine (PRISTIQ) 100 MG 24 hr tablet Take 100 mg by mouth daily. (Patient not taking: Reported on 08/21/2020)    . DULoxetine (CYMBALTA) 60 MG capsule Take 60 mg by mouth every morning. (Patient not taking: Reported on 08/21/2020)    . QUEtiapine (SEROQUEL) 200 MG tablet Take 1 tablet (200 mg total) by mouth at bedtime. (Patient not taking: No sig reported) 30 tablet 1    Musculoskeletal:  Psychiatric Specialty Exam: Physical Exam Cardiovascular:     Rate and Rhythm: Normal rate.     Pulses: Normal pulses.  Pulmonary:     Effort: Pulmonary effort is normal.  Musculoskeletal:        General: Normal range of motion.     Cervical back: Normal range of motion.  Neurological:     Mental Status: He is alert and oriented to person, place, and time.     Review of Systems   Constitutional: Negative.   HENT: Negative.   Eyes: Negative.   Respiratory: Negative.   Cardiovascular: Negative.   Gastrointestinal: Negative.   Endocrine: Negative.   Genitourinary: Negative.   Musculoskeletal: Negative.   Allergic/Immunologic: Negative.   Neurological: Negative.   Hematological: Negative.   Psychiatric/Behavioral: Positive for agitation, behavioral problems and suicidal ideas (without plan or intent). Negative for confusion, hallucinations and sleep disturbance. The patient is nervous/anxious and is hyperactive.     Blood pressure (!) 154/98, pulse 98, temperature 97.9 F (36.6 C), temperature source Oral, resp. rate 17, SpO2 99 %.There is no height or weight on file to calculate BMI.  General Appearance: Bizarre  Eye Contact:  Good  Speech:  Clear and Coherent and Normal Rate  Volume:  Normal  Mood:  Anxious and Dysphoric  Affect:  Congruent and Constricted  Thought Process:  Coherent, Goal Directed and Descriptions of Associations: Intact  Orientation:  Full (Time, Place, and Person)  Thought Content:  Illogical, Rumination and ruminates on receiving seroquel  Suicidal Thoughts:  Yes.  without intent/plan  Homicidal Thoughts:  No  Memory:  Immediate;   Good Recent;   Good Remote;   Good  Judgement:  Fair, baseline  Insight:  Fair, appears baseline  Psychomotor Activity:  Increased  Concentration:  Concentration: Fair and Attention Span: Fair  Recall:  Good  Fund of Knowledge:  Good  Language:  Good  Akathisia:  No  Handed:  Right  AIMS (if indicated):     Assets:  Communication Skills Social Support  ADL's:  Intact  Cognition:  WNL  Sleep:   fair   Treatment Plan Summary: Plan- As per above assessment, there are no current grounds for involuntary commitment at this time.?  Patient is not currently interested in inpatient services, but expresses agreement to continue outpatient treatment - we have reviewed importance of substance abuse  abstinence, potential negative impact substance abuse can have on his relationships and level of functioning, and importance of medication compliance. ?  As discussed with patient, will call 2 days supply of seroquel to cover him until his appt on 5/31.  SW to reach out to his ACTT and or family members to facilitate who will pick him up for discharge.   Disposition:  No evidence of imminent risk to self or others at present.   Patient does not meet criteria for psychiatric inpatient admission. Supportive therapy provided about ongoing stressors. Discussed crisis plan, support from social network, calling 911, coming to the Emergency Department, and calling Suicide Hotline.   Spoke with Dr. Rona Ravens, [facility] ; informed of above recommendation and disposition  This service was provided via telemedicine using a 2-way, interactive audio and video technology.  Names of all persons participating in this telemedicine service and their role in this encounter. Name: Rosemarie Ax Role: Patient  Name: Merlyn Lot Role: [place], NP 08/22/2020 12:14 PM   SW notes reviewed, in lieu of family member concern, it does not seem he can return home.  Since he does not have familial support, I am not comfortable discharging him to a homeless shelter or hotel where there is no one help him.  Patient would benefit from restarting meds to stabilize prior to discharge.  This was reviewed with PA Rona Ravens and Raquel Sarna, RN.

## 2020-08-22 NOTE — Progress Notes (Signed)
CSW arranged for transportation home via [employer].  Madilyn Fireman, MSW, LCSW Transitions of Care  Clinical Social Worker II [phone]

## 2020-08-22 NOTE — Progress Notes (Signed)
CSW spoke with the sister of the patient in reference to her providing transportion for her brother upon discharge. She advised at time, she would not be able to pick the patient up due to not having any gas to drive to pick him up, not having any money until next Friday and no one in the family having any money to provide her gas money at this time. The sister also advised that the patient ACTT services through [facility] has not started to provide assistance. The sister expressed that the sister has presented at the home with aggressive behaviors and destruction to property. The sister is concern about the patient returning home at this time without medication or means to pay for medications. CSW informed provider of communications with the sister.   Glennie Isle, MSW, [place], LCAS-A Phone: [phone] Disposition/TOC

## 2020-08-22 NOTE — ED Notes (Signed)
Pt requesting seroquel.

## 2020-08-22 NOTE — ED Notes (Signed)
Per Behavioral Health NP, pt can no longer be psych cleared. Pt's sister won't take pt back home. SW has been consulted.

## 2020-08-22 NOTE — ED Notes (Signed)
Pt agitated, breathing heavily and asking if he can go outside and/or walk the halls. Explained to pt that he couldn't go outside at this time and that people were not able to walk the halls at this time d/t covid. Pt agreed to staying in his room at this time. Notified Rona Ravens PA.

## 2020-08-22 NOTE — ED Notes (Signed)
The pt has been screaming and yelling to the top of his lungs for the last hour

## 2020-08-22 NOTE — ED Notes (Signed)
Discharge instructions reviewed w/ pt. Pt calm and cooperative at this time. VSS. Pt informed that we are waiting for [employer] which social work is setting up to get him home.

## 2020-08-22 NOTE — ED Notes (Signed)
Pt yelling and beating on stretcher d/t not having his seroquel at this time. Explained to pt that medication is scheduled for tonight and I will have to ask if there is something else we could give him. Pt verbalized understanding at this time.

## 2020-08-22 NOTE — ED Notes (Signed)
The pt has been quiet for about 30 minutes

## 2020-08-22 NOTE — ED Notes (Signed)
The pt continues to yell and scream loudly he wants something for pain

## 2020-08-22 NOTE — ED Provider Notes (Addendum)
Emergency Medicine Observation Re-evaluation Note  Michael Schmitt is a 50 y.o. male, seen on rounds today.  Pt initially presented to the ED for complaints of Suicidal Currently, the patient is calm.  Physical Exam  BP (!) 154/98 (BP Location: Right Arm)   Pulse 98   Temp 97.9 F (36.6 C) (Oral)   Resp 17   SpO2 99%  Physical Exam General: sitting in bed with his shirt off   Cardiac: RRR Lungs: breathing nonlabored Psych: calm  ED Course / MDM  EKG:   I have reviewed the labs performed to date as well as medications administered while in observation.  Recent changes in the last 24 hours include patient being verbally aggressive, screaming intermittently.  Plan  Current plan is for psych to eval and determine disposition. Patient is not under full IVC at this time.  10:55 AM Psychiatry has evaluated patient and he is psychiatrically cleared.  Patient will be discharged home.  Psychiatry has called his outpatient pharmacy to give him a refill for Seroquel for the next 3 days.  I have also reach out to patient's family to notify it has status.  Patient will be discharge  11:22 AM Shneese, NP from psychiatry just updated that family member does not want to accept him back at home and he does not have a place to stay.  They recommend social worker involvement to identify a place for him to stay and in the mean time psych will continue with their management of his psychiatric needs.     12:10 PM Social worker have been involved, felt that pt's sister is obligated to accept patient back to her home.  Will secure an uber driver to take pt back.  Pt should be able to obtain his psych medication at the pharmacy as initiated by our Psych Team.  Will discharge.     Domenic Moras, PA-C 08/22/20 1211    Horton, Alvin Critchley, DO 08/22/20 1336

## 2020-08-22 NOTE — ED Notes (Signed)
Pt denying SI at this time

## 2020-08-22 NOTE — ED Notes (Addendum)
Pt intermittently yelling in room

## 2020-08-22 NOTE — ED Notes (Signed)
Patient complaining of headache, patient medicated per Dr Leonette Monarch verbal orders for patient.

## 2020-08-24 ENCOUNTER — Encounter: Payer: Self-pay | Admitting: Emergency Medicine

## 2020-08-24 ENCOUNTER — Emergency Department
Admission: EM | Admit: 2020-08-24 | Discharge: 2020-08-24 | Disposition: A | Discharge status: Home / Self Care | Payer: Medicare Other | Attending: Emergency Medicine | Admitting: Emergency Medicine

## 2020-08-24 ENCOUNTER — Other Ambulatory Visit: Payer: Self-pay

## 2020-08-24 DIAGNOSIS — F329 Major depressive disorder, single episode, unspecified: Secondary | ICD-10-CM | POA: Diagnosis not present

## 2020-08-24 DIAGNOSIS — Z046 Encounter for general psychiatric examination, requested by authority: Secondary | ICD-10-CM | POA: Diagnosis present

## 2020-08-24 DIAGNOSIS — Z7982 Long term (current) use of aspirin: Secondary | ICD-10-CM | POA: Diagnosis not present

## 2020-08-24 DIAGNOSIS — F331 Major depressive disorder, recurrent, moderate: Secondary | ICD-10-CM | POA: Diagnosis present

## 2020-08-24 DIAGNOSIS — I2510 Atherosclerotic heart disease of native coronary artery without angina pectoris: Secondary | ICD-10-CM | POA: Insufficient documentation

## 2020-08-24 DIAGNOSIS — R451 Restlessness and agitation: Secondary | ICD-10-CM | POA: Diagnosis not present

## 2020-08-24 DIAGNOSIS — Z7984 Long term (current) use of oral hypoglycemic drugs: Secondary | ICD-10-CM | POA: Diagnosis not present

## 2020-08-24 DIAGNOSIS — J45909 Unspecified asthma, uncomplicated: Secondary | ICD-10-CM | POA: Diagnosis not present

## 2020-08-24 DIAGNOSIS — F609 Personality disorder, unspecified: Secondary | ICD-10-CM | POA: Diagnosis present

## 2020-08-24 DIAGNOSIS — Z79899 Other long term (current) drug therapy: Secondary | ICD-10-CM | POA: Diagnosis not present

## 2020-08-24 DIAGNOSIS — E1122 Type 2 diabetes mellitus with diabetic chronic kidney disease: Secondary | ICD-10-CM | POA: Diagnosis not present

## 2020-08-24 DIAGNOSIS — Z7902 Long term (current) use of antithrombotics/antiplatelets: Secondary | ICD-10-CM | POA: Diagnosis not present

## 2020-08-24 DIAGNOSIS — F17210 Nicotine dependence, cigarettes, uncomplicated: Secondary | ICD-10-CM | POA: Insufficient documentation

## 2020-08-24 DIAGNOSIS — I129 Hypertensive chronic kidney disease with stage 1 through stage 4 chronic kidney disease, or unspecified chronic kidney disease: Secondary | ICD-10-CM | POA: Diagnosis not present

## 2020-08-24 DIAGNOSIS — N189 Chronic kidney disease, unspecified: Secondary | ICD-10-CM | POA: Insufficient documentation

## 2020-08-24 MED ORDER — QUETIAPINE FUMARATE 300 MG PO TABS: 600.0000 mg | ORAL_TABLET | Freq: Every day | ORAL | 1 refills | Status: DC

## 2020-08-24 MED ORDER — LORAZEPAM 2 MG/ML IJ SOLN
2.0000 mg | Freq: Once | INTRAMUSCULAR | Status: AC
  Administered 2020-08-24: 2 mg via INTRAMUSCULAR
  Filled 2020-08-24: qty 1

## 2020-08-24 MED ORDER — VENLAFAXINE HCL ER 75 MG PO CP24: 75.0000 mg | ORAL_CAPSULE | Freq: Every day | ORAL | 1 refills | Status: DC

## 2020-08-24 NOTE — ED Notes (Signed)
Pt was very loud and yelling when first arrived. Pt has now calmed down and is resting in the bed. Pt has his support people/gaurdains with him. Pt is still in street clothes but has been wand ed by security.

## 2020-08-24 NOTE — ED Provider Notes (Addendum)
Pt had declined labs and was not IVCed and just had them done recently. Patient seen by psychiatry and provided prescription and cleared for discharge home     Vanessa Dickens, MD 08/24/20 1638    Vanessa Bath, MD 08/24/20 [phone]

## 2020-08-24 NOTE — ED Notes (Signed)
Patient refusing blood work and to dress out at this time. Patient wanded by security before entering room. MD [facility] aware.

## 2020-08-24 NOTE — ED Notes (Signed)
Vol at this time pending consult

## 2020-08-24 NOTE — ED Triage Notes (Signed)
Pt reports via [place] sheriff voluntarily crying and shouting stating "I need help". Pt does not endorse SI/HI to this RN. Pt states "they're just going to send me home". Pt continually screaming help, MD at bedsides, IM medications ordered and administered as ordered. Patient took medications voluntarily. Patient also stating "I have demons" but will not elaborate.

## 2020-08-24 NOTE — Consult Note (Signed)
[facility] Psychiatry Consult   Reason for Consult: Consult for 50 year old man well-known to the emergency room brought in by  [facility] crisis services Referring Physician: Jari Pigg Patient Identification: Michael Schmitt MRN:  [id number] Principal Diagnosis: Major depressive disorder, recurrent episode, moderate ([place]) Diagnosis:  Principal Problem:   Major depressive disorder, recurrent episode, moderate ([place]) Active Problems:   Personality disorder ([place])   Total Time spent with patient: 1 hour  Subjective:   Michael Schmitt is a 50 y.o. male patient admitted with "I was hollering and screaming".  HPI: Patient seen chart reviewed.  50 year old man very well known to the emergency room brought in by [facility] crisis services.  He reports that he had an appointment today to go to [facility] to get his medicine filled at [facility] but by mid morning today he was screaming and yelling and holding knives to his neck and could not get on the bus.  Patient says he has not slept for 5 days.  He has been out of his medicine.  He was taking the Seroquel that I gave him at a higher dosage of 600 mg at night which he claims is his more normal dosage.  Patient was agitated and screaming as is his usual behavior when he first came to the ER but after getting some Ativan became very sedated and lucid.  He is now denying suicidal intent.  Denies homicidal ideation.  Denies acute psychosis.  Requests to be back on Seroquel 600 mg at night and to continue his Effexor  Past Psychiatric History: Long history of mood symptoms personality disorder behaviors agitation self-injury but no very serious suicide attempts.  Self sabotage of treatment.  Abuse of prescription drugs.  Risk to Self:   Risk to Others:   Prior Inpatient Therapy:   Prior Outpatient Therapy:    Past Medical History:  Past Medical History:  Diagnosis Date  . Anxiety   . Anxiety   . Arthritis    cerv. stenosis,  spondylosis, HNP- lower back , has been followed in pain clinic, has  had injection s in cerv. area  . Blood dyscrasia    told that when he was young he was a" free bleeder"  . CAD (coronary artery disease)   . Cervical spondylosis without myelopathy 07/24/2014  . Cervicogenic headache 07/24/2014  . Chronic kidney disease    renal calculi- passed spontaneously  . Depression   . Diabetes mellitus without complication ([place])   . Fatty liver   . GERD (gastroesophageal reflux disease)   . Headache(784.0)   . Hyperlipidemia   . Hypertension   . 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 Mental disorder   . MI, old   . RLS (restless legs syndrome)    detected on sleep study  . Shortness of breath   . Ventricular fibrillation ([place]) 04/05/2016    Past Surgical History:  Procedure Laterality Date  . ANTERIOR CERVICAL DECOMP/DISCECTOMY FUSION  11/13/2011   Procedure: ANTERIOR CERVICAL DECOMPRESSION/DISCECTOMY FUSION 1 LEVEL;  Surgeon: [name], MD;  Location: [place] [facility];  Service: Neurosurgery;  Laterality: N/A;  Anterior Cervical Decompression/discectomy Fusion. Cervical three-four.  Marland Kitchen CARDIAC CATHETERIZATION N/A 01/01/2015   Procedure: Left Heart Cath and Coronary Angiography;  Surgeon: [name], MD;  Location: [place] CV LAB;  Service: Cardiovascular;  Laterality: N/A;  . CARDIAC CATHETERIZATION N/A 04/05/2016   Procedure: Left Heart Cath and Coronary Angiography;  Surgeon: Lynnell Dike  Tamala Julian, MD;  Location: [place] CV LAB;  Service: Cardiovascular;  Laterality: N/A;  . CARDIAC CATHETERIZATION N/A 04/05/2016   Procedure: Coronary Stent Intervention;  Surgeon: Belva Crome, MD;  Location: [place] CV [facility];  Service: Cardiovascular;  Laterality: N/A;  . CARDIAC CATHETERIZATION N/A 04/05/2016   Procedure: Intravascular Ultrasound/IVUS;  Surgeon: Belva Crome, MD;  Location: [place] CV LAB;  Service: Cardiovascular;  Laterality: N/A;  . NASAL SINUS  SURGERY     2005   Family History:  Family History  Problem Relation Age of Onset  . Prostate cancer Father   . Hypertension Mother   . Kidney Stones Mother   . Anxiety disorder Mother   . Depression Mother   . COPD Sister   . Hypertension Sister   . Diabetes Sister   . Depression Sister   . Anxiety disorder Sister   . Seizures Sister   . ADD / ADHD Son   . ADD / ADHD Daughter    Family Psychiatric  History: None Social History:  Social History   Substance and Sexual Activity  Alcohol Use No  . Alcohol/week: 0.0 standard drinks   Comment: last drinked in 3 months.      Social History   Substance and Sexual Activity  Drug Use No    Social History   Socioeconomic History  . Marital status: Single    Spouse name: Not on file  . Number of children: 3  . Years of education: 16  . Highest education level: Not on file  Occupational History    Comment: unemployed  Tobacco Use  . Smoking status: Current Every Day Smoker    Packs/day: 2.00    Years: 17.00    Pack years: 34.00    Types: Cigarettes, Cigars  . Smokeless tobacco: Former Systems developer  . Tobacco comment: occassional snuff   Vaping Use  . Vaping Use: Never used  Substance and Sexual Activity  . Alcohol use: No    Alcohol/week: 0.0 standard drinks    Comment: last drinked in 3 months.   . Drug use: No  . Sexual activity: Not Currently  Other Topics Concern  . Not on file  Social History Narrative   Patient is right handed.   Patient drinks 2 sodas daily.   Social Determinants of Health   Financial Resource Strain: Not on file  Food Insecurity: Not on file  Transportation Needs: Not on file  Physical Activity: Not on file  Stress: Not on file  Social Connections: Not on file   Additional Social History:    Allergies:   Allergies  Allergen Reactions  . Prednisone Other (See Comments)    Hypertension and makes him feel "spacey"  . Amoxicillin Other (See Comments)    Other Reaction: ELEVATED BP   . Hydrocodone Other (See Comments)    Causes headaches and makes the patient irritable  . Varenicline Other (See Comments)    Suicidal thoughts  . Other Anxiety    Other reaction(s): Other (See Comments) Patient stated that this keeps him awake; "has an opposite effect on me"  . Trazodone And Nefazodone Anxiety and Other (See Comments)    Patient stated that this keeps him awake; "has an opposite effect on me"    Labs: No results found for this or any previous visit (from the past 48 hour(s)).  No current facility-administered medications for this encounter.   Current Outpatient Medications  Medication Sig Dispense Refill  . albuterol (VENTOLIN HFA) 108 (90  Base) MCG/ACT inhaler Inhale 2 puffs into the lungs every 6 (six) hours as needed.    Marland Kitchen aspirin 81 MG EC tablet Take 1 tablet (81 mg total) by mouth daily. For heart health (Patient not taking: No sig reported) 30 tablet 1  . atorvastatin (LIPITOR) 20 MG tablet Take 20 mg by mouth daily.    . baclofen (LIORESAL) 10 MG tablet Take 10 mg by mouth 3 (three) times daily.    . carvedilol (COREG) 6.25 MG tablet Take 6.25 mg by mouth 2 (two) times daily with a meal.    . chlorproMAZINE (THORAZINE) 200 MG tablet Take 600 mg by mouth at bedtime. (Patient not taking: Reported on 08/21/2020)    . clopidogrel (PLAVIX) 75 MG tablet Take 75 mg by mouth daily.    Marland Kitchen desvenlafaxine (PRISTIQ) 100 MG 24 hr tablet Take 100 mg by mouth daily. (Patient not taking: Reported on 08/21/2020)    . DULoxetine (CYMBALTA) 60 MG capsule Take 60 mg by mouth every morning. (Patient not taking: Reported on 08/21/2020)    . gabapentin (NEURONTIN) 800 MG tablet Take 800 mg by mouth 3 (three) times daily.    . hydrOXYzine (VISTARIL) 100 MG capsule Take 100 mg by mouth at bedtime.    . INGREZZA 80 MG capsule Take 80 mg by mouth daily.    Marland Kitchen lisinopril (ZESTRIL) 10 MG tablet Take 10 mg by mouth daily.    . metFORMIN (GLUCOPHAGE) 1000 MG tablet Take 1,000 mg by mouth 2 (two)  times daily.    . mirtazapine (REMERON) 15 MG tablet Take 7.5 mg by mouth at bedtime.    . pantoprazole (PROTONIX) 40 MG tablet Take 1 tablet by mouth daily.    . QUEtiapine (SEROQUEL) 300 MG tablet Take 2 tablets (600 mg total) by mouth at bedtime. 60 tablet 1  . REXULTI 1 MG TABS tablet Take 1 mg by mouth daily.    Marland Kitchen rOPINIRole (REQUIP) 0.5 MG tablet Take 0.5 mg by mouth at bedtime.    Marland Kitchen venlafaxine XR (EFFEXOR XR) 75 MG 24 hr capsule Take 1 capsule (75 mg total) by mouth daily. 30 capsule 1    Musculoskeletal: Strength & Muscle Tone: within normal limits Gait & Station: normal Patient leans: N/A            Psychiatric Specialty Exam:  Presentation  General Appearance: No data recorded Eye Contact:No data recorded Speech:No data recorded Speech Volume:No data recorded Handedness:No data recorded  Mood and Affect  Mood:No data recorded Affect:No data recorded  Thought Process  Thought Processes:No data recorded Descriptions of Associations:No data recorded Orientation:No data recorded Thought Content:No data recorded History of Schizophrenia/Schizoaffective disorder:No data recorded Duration of Psychotic Symptoms:No data recorded Hallucinations:No data recorded Ideas of Reference:No data recorded Suicidal Thoughts:No data recorded Homicidal Thoughts:No data recorded  Sensorium  Memory:No data recorded Judgment:No data recorded Insight:No data recorded  Executive Functions  Concentration:No data recorded Attention Span:No data recorded Recall:No data recorded Fund of Knowledge:No data recorded Language:No data recorded  Psychomotor Activity  Psychomotor Activity:No data recorded  Assets  Assets:No data recorded  Sleep  Sleep:No data recorded  Physical Exam: Physical Exam Vitals and nursing note reviewed.  Constitutional:      Appearance: Normal appearance.  HENT:     Head: Normocephalic and atraumatic.     Mouth/Throat:     Pharynx:  Oropharynx is clear.  Eyes:     Pupils: Pupils are equal, round, and reactive to light.  Cardiovascular:  Rate and Rhythm: Normal rate and regular rhythm.  Pulmonary:     Effort: Pulmonary effort is normal.     Breath sounds: Normal breath sounds.  Abdominal:     General: Abdomen is flat.     Palpations: Abdomen is soft.  Musculoskeletal:        General: Normal range of motion.  Skin:    General: Skin is warm and dry.  Neurological:     General: No focal deficit present.     Mental Status: He is alert. Mental status is at baseline.  Psychiatric:        Attention and Perception: He is inattentive.        Mood and Affect: Mood normal. Affect is blunt.        Speech: Speech is delayed.        Behavior: Behavior is slowed.        Thought Content: Thought content normal. Thought content is not paranoid. Thought content does not include homicidal or suicidal ideation.        Cognition and Memory: Memory is impaired.        Judgment: Judgment is inappropriate.    Review of Systems  Constitutional: Negative.   HENT: Negative.   Eyes: Negative.   Respiratory: Negative.   Cardiovascular: Negative.   Gastrointestinal: Negative.   Musculoskeletal: Negative.   Skin: Negative.   Neurological: Negative.   Psychiatric/Behavioral: Positive for depression. Negative for hallucinations, memory loss, substance abuse and suicidal ideas. The patient is nervous/anxious and has insomnia.    Blood pressure (!) 191/120, pulse (!) 110, temperature 98 F (36.7 C), resp. rate 19, SpO2 98 %. There is no height or weight on file to calculate BMI.  Treatment Plan Summary: Medication management and Plan 50 year old man well known to [place] who has now calm down substantially.  Not reporting psychosis or acute suicidal ideation.  Has a place to live.  No indication for commitment or inpatient hospitalization.  Discussed with patient what he thinks would be helpful and he insists that the 600 mg of Seroquel  he was taking before helped him to sleep adequately and was more tolerable.  I will give him a prescription for 600 mg of Seroquel at night as well as continuing his 75 mg a day of extended release Effexor.  1 refill on this.  Told in no uncertain terms he must go to the outpatient clinic and start getting treatment to stay on his medicine.  Patient can be discharged from the emergency room at this point.  He does not benefit from inpatient treatment.  Disposition: No evidence of imminent risk to self or others at present.   Patient does not meet criteria for psychiatric inpatient admission. Supportive therapy provided about ongoing stressors. Discussed crisis plan, support from social network, calling 911, coming to the Emergency Department, and calling Suicide Hotline.  Alethia Berthold, MD 08/24/2020 4:28 PM

## 2020-08-24 NOTE — ED Provider Notes (Signed)
[facility] Emergency Department Provider Note  ____________________________________________  Time seen: Approximately 2:40 PM  I have reviewed the triage vital signs and the nursing notes.   HISTORY  Chief Complaint Psychiatric Evaluation    Level 5 Caveat: Portions of the History and Physical including HPI and review of systems are unable to be completely obtained due to patient being a poor historian   HPI Michael Schmitt is a 50 y.o. male with a past history of personality disorder, anxiety, suicidal ideation who comes to the ED voluntarily today stating that he does not feel good and requesting help.  States he needs something to calm him down.  He arrives accompanied by staff from [facility] who did a home visit today in advance of him having a first appointment with behavioral medicine outpatient clinic scheduled for 2:00 PM this afternoon.  Reportedly the patient has been off his medication for at least the last 5 days and deteriorating for that same period of time.  Patient denies SI HI or hallucinations but reports having "demons."    Patient reports some minor alcohol use recently, asked about any drug use, he replies "never."   Past Medical History:  Diagnosis Date  . Anxiety   . Anxiety   . Arthritis    cerv. stenosis, spondylosis, HNP- lower back , has been followed in pain clinic, has  had injection s in cerv. area  . Blood dyscrasia    told that when he was young he was a" free bleeder"  . CAD (coronary artery disease)   . Cervical spondylosis without myelopathy 07/24/2014  . Cervicogenic headache 07/24/2014  . Chronic kidney disease    renal calculi- passed spontaneously  . Depression   . Diabetes mellitus without complication ([place])   . Fatty liver   . GERD (gastroesophageal reflux disease)   . Headache(784.0)   . Hyperlipidemia   . Hypertension   . 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  Mental disorder   . MI, old   . RLS (restless legs syndrome)    detected on sleep study  . Shortness of breath   . Ventricular fibrillation ([place]) 04/05/2016     Patient Active Problem List   Diagnosis Date Noted  . Insomnia 08/22/2020  . 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 Lithium toxicity   . Hyponatremia 01/24/2019  . Substance induced mood disorder ([place]) 11/22/2018  . Alcohol abuse with alcohol-induced mood disorder ([place]) 09/21/2018  . Overdose 05/17/2018  . Depression, major, recurrent, severe with psychosis ([place]) 03/30/2018  . Personality disorder ([place]) 12/31/2017  . MDD (major depressive disorder), recurrent episode, severe ([place]) 11/20/2017  . Dysthymia 10/26/2017  . Major depressive disorder, recurrent episode, moderate ([place]) 03/10/2017  . Generalized anxiety disorder 02/05/2017  . Chronic neck pain (Primary Area of Pain) (Left) 11/22/2016  . Cervical facet syndrome (Left) 11/22/2016  . Cervical radiculitis (Left) 11/22/2016  . History of cervical spinal surgery 11/22/2016  . Cervical spondylosis 11/22/2016  . Chronic shoulder pain (Secondary Area of Pain) (Left) 11/22/2016  . Arthropathy of shoulder (Left) 11/22/2016  . Chronic low back pain [facility] Area of Pain) (Bilateral) (L>R) 11/22/2016  . Lumbar facet syndrome (Bilateral) (L>R) 11/22/2016  . Chronic pain syndrome 11/21/2016  . Long term prescription benzodiazepine use 11/21/2016  . Opiate use 11/21/2016  . Type 2 diabetes mellitus without complication, without long-term current use of insulin ([place]) 10/19/2016  . Chronic Suicidal ideation 09/15/2016  .  Coronary artery disease 09/12/2016  . HTN (hypertension) 06/26/2016  . GERD (gastroesophageal reflux disease) 06/26/2016  . Alcohol use disorder, severe, 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[place]) 06/26/2016  . Benzodiazepine abuse ([place]) 06/11/2016  . Sedative, hypnotic or anxiolytic use disorder, severe, dependence ([place]) 05/12/2016  . Ventricular fibrillation  ([place]) 04/05/2016  . Combined hyperlipidemia 01/05/2016  . Cervical spondylosis without myelopathy 07/24/2014  . Tobacco use disorder 10/20/2010  . Asthma 10/20/2010     Past Surgical History:  Procedure Laterality Date  . ANTERIOR CERVICAL DECOMP/DISCECTOMY FUSION  11/13/2011   Procedure: ANTERIOR CERVICAL DECOMPRESSION/DISCECTOMY FUSION 1 LEVEL;  Surgeon: [name], MD;  Location: [facility] NEURO ORS;  Service: Neurosurgery;  Laterality: N/A;  Anterior Cervical Decompression/discectomy Fusion. Cervical three-four.  Marland Kitchen CARDIAC CATHETERIZATION N/A 01/01/2015   Procedure: Left Heart Cath and Coronary Angiography;  Surgeon: [name], MD;  Location: [place] CV LAB;  Service: Cardiovascular;  Laterality: N/A;  . CARDIAC CATHETERIZATION N/A 04/05/2016   Procedure: Left Heart Cath and Coronary Angiography;  Surgeon: Belva Crome, MD;  Location: [place] CV LAB;  Service: Cardiovascular;  Laterality: N/A;  . CARDIAC CATHETERIZATION N/A 04/05/2016   Procedure: Coronary Stent Intervention;  Surgeon: Belva Crome, MD;  Location: [place] CV LAB;  Service: Cardiovascular;  Laterality: N/A;  . CARDIAC CATHETERIZATION N/A 04/05/2016   Procedure: Intravascular Ultrasound/IVUS;  Surgeon: Belva Crome, MD;  Location: [place] CV LAB;  Service: Cardiovascular;  Laterality: N/A;  . NASAL SINUS SURGERY     2005     Prior to Admission medications   Medication Sig Start Date End Date Taking? Authorizing Provider  albuterol (VENTOLIN HFA) 108 (90 Base) MCG/ACT inhaler Inhale 2 puffs into the lungs every 6 (six) hours as needed. 10/10/19 10/09/20  [provider]  aspirin 81 MG EC tablet Take 1 tablet (81 mg total) by mouth daily. For heart health Patient not taking: No sig reported 11/21/17   Lindell Spar I, NP  atorvastatin (LIPITOR) 20 MG tablet Take 20 mg by mouth daily. 12/16/19   [provider]  baclofen (LIORESAL) 10 MG tablet Take 10 mg by mouth 3 (three) times daily. 07/06/20    [provider]  carvedilol (COREG) 6.25 MG tablet Take 6.25 mg by mouth 2 (two) times daily with a meal. 10/25/18 08/21/20  [provider]  chlorproMAZINE (THORAZINE) 200 MG tablet Take 600 mg by mouth at bedtime. Patient not taking: Reported on 08/21/2020 11/17/19   [provider]  clonazePAM (KLONOPIN) 1 MG tablet Take 0.5 mg by mouth 2 (two) times daily. Patient not taking: 1 tablet (1 mg total). Reported on 08/21/2020 06/10/20   [provider]  clopidogrel (PLAVIX) 75 MG tablet Take 75 mg by mouth daily. 12/16/19   [provider]  desvenlafaxine (PRISTIQ) 100 MG 24 hr tablet Take 100 mg by mouth daily. Patient not taking: Reported on 08/21/2020 05/24/20   [provider]  DULoxetine (CYMBALTA) 60 MG capsule Take 60 mg by mouth every morning. Patient not taking: Reported on 08/21/2020 07/06/20   [provider]  gabapentin (NEURONTIN) 800 MG tablet Take 800 mg by mouth 3 (three) times daily. 07/06/20   [provider]  hydrOXYzine (VISTARIL) 100 MG capsule Take 100 mg by mouth at bedtime.    [provider]  INGREZZA 80 MG capsule Take 80 mg by mouth daily. 04/30/20   [provider]  lisinopril (ZESTRIL) 10 MG tablet Take 10 mg by mouth daily. 12/16/19   [provider]  metFORMIN (GLUCOPHAGE) 1000 MG tablet Take 1,000 mg by mouth 2 (two) times daily. 10/02/18   [provider]  mirtazapine (REMERON) 15 MG tablet Take 7.5 mg by mouth at bedtime. 07/06/20   [provider]  pantoprazole (PROTONIX) 40 MG tablet Take 1 tablet by mouth daily. 08/18/20   [provider]  QUEtiapine (SEROQUEL) 200 MG tablet Take 1 tablet (200 mg total) by mouth at bedtime. Patient not taking: No sig reported 08/13/20   Clapacs, Madie Reno, MD  QUEtiapine (SEROQUEL) 300 MG tablet Take 600 mg by mouth at bedtime. 07/07/20   [provider]  REXULTI 1 MG TABS tablet Take 1 mg by mouth daily. 03/26/20    [provider]  rOPINIRole (REQUIP) 0.5 MG tablet Take 0.5 mg by mouth at bedtime. 07/07/20   [provider]  venlafaxine XR (EFFEXOR XR) 75 MG 24 hr capsule Take 1 capsule (75 mg total) by mouth daily. 08/13/20 08/13/21  Clapacs, Madie Reno, MD     Allergies Prednisone, Amoxicillin, Hydrocodone, Varenicline, Other, and Trazodone and nefazodone   Family History  Problem Relation Age of Onset  . Prostate cancer Father   . Hypertension Mother   . Kidney Stones Mother   . Anxiety disorder Mother   . Depression Mother   . COPD Sister   . Hypertension Sister   . Diabetes Sister   . Depression Sister   . Anxiety disorder Sister   . Seizures Sister   . ADD / ADHD Son   . ADD / ADHD Daughter     Social History Social History   Tobacco Use  . Smoking status: Current Every Day Smoker    Packs/day: 2.00    Years: 17.00    Pack years: 34.00    Types: Cigarettes, Cigars  . Smokeless tobacco: Former Systems developer  . Tobacco comment: occassional snuff   Vaping Use  . Vaping Use: Never used  Substance Use Topics  . Alcohol use: No    Alcohol/week: 0.0 standard drinks    Comment: last drinked in 3 months.   . Drug use: No    Review of Systems Level 5 Caveat: Portions of the History and Physical including HPI and review of systems are unable to be completely obtained due to patient being a poor historian   Constitutional:   No known fever.  ENT:   No rhinorrhea. Cardiovascular:   No chest pain or syncope. Respiratory:   No dyspnea or cough. Gastrointestinal:   Negative for abdominal pain, vomiting and diarrhea.  Musculoskeletal:   Negative for focal pain or swelling.  No injuries ____________________________________________   PHYSICAL EXAM:  VITAL SIGNS: ED Triage Vitals  Enc Vitals Group     BP 08/24/20 1249 (!) 191/120     Pulse Rate 08/24/20 1249 (!) 110     Resp 08/24/20 1249 19     Temp 08/24/20 1249 98 F (36.7 C)     Temp src --      SpO2 08/24/20 1249  98 %     Weight --      Height --      Head Circumference --      Peak Flow --      Pain Score 08/24/20 1321 0     Pain Loc --      Pain Edu? --      Excl. in [place]? --     Vital signs reviewed, nursing assessments reviewed.   Constitutional:   Alert and  oriented. Non-toxic appearance. Eyes:   Conjunctivae are normal. EOMI. PERRL. ENT      Head:   Normocephalic and atraumatic.      Nose:   No congestion/rhinnorhea.       Mouth/Throat:   MMM, no pharyngeal erythema. No peritonsillar mass.       Neck:   No meningismus. Full ROM. Hematological/Lymphatic/Immunilogical:   No cervical lymphadenopathy. Cardiovascular:   RRR. Symmetric bilateral radial and DP pulses.  No murmurs. Cap refill less than 2 seconds. Respiratory:   Normal respiratory effort without tachypnea/retractions. Breath sounds are clear and equal bilaterally. No wheezes/rales/rhonchi. Gastrointestinal:   Soft and nontender. Non distended. There is no CVA tenderness.  No rebound, rigidity, or guarding. Genitourinary:   deferred Musculoskeletal:   Normal range of motion in all extremities. No joint effusions.  No lower extremity tenderness.  No edema. Neurologic:   Normal speech and language.  Motor grossly intact. No acute focal neurologic deficits are appreciated.  Skin:    Skin is warm, dry and intact. No rash noted.  No petechiae, purpura, or bullae.  ____________________________________________    LABS (pertinent positives/negatives) (all labs ordered are listed, but only abnormal results are displayed) Labs Reviewed  COMPREHENSIVE METABOLIC PANEL  ACETAMINOPHEN LEVEL  ETHANOL  LIPASE, BLOOD  SALICYLATE LEVEL  CBC WITH DIFFERENTIAL/PLATELET  URINE DRUG SCREEN, QUALITATIVE ([place])   ____________________________________________   EKG    ____________________________________________    RADIOLOGY  No results  found.  ____________________________________________   PROCEDURES Procedures  ____________________________________________    CLINICAL IMPRESSION / ASSESSMENT AND PLAN / ED COURSE  Medications ordered in the ED: Medications  LORazepam (ATIVAN) injection 2 mg (2 mg Intramuscular Given 08/24/20 1324)    Pertinent labs & imaging results that were available during my care of the patient were reviewed by me and considered in my medical decision making (see chart for details).   Michael Schmitt was evaluated in Emergency Department on 08/24/2020 for the symptoms described in the history of present illness. He was evaluated in the context of the global COVID-19 pandemic, which necessitated consideration that the patient might be at risk for infection with the SARS-CoV-2 virus that causes COVID-19. Institutional protocols and algorithms that pertain to the evaluation of patients at risk for COVID-19 are in a state of rapid change based on information released by regulatory bodies including the CDC and federal and state organizations. These policies and algorithms were followed during the patient's care in the ED.   Patient presents with agitation.  He has had multiple presentations to this and other emergency departments for similar symptoms, well-known to [place] and psychiatry service.  This is consistent with his usual behaviors.  No signs of any acute injury.  Presentation may be related to alcohol or stimulant intoxication versus anxiety or malingering.  Will consult psychiatry.  I do not think the patient is a danger to himself or others, not a candidate for IVC at this time.  Patient given 2 mg intramuscular Ativan to help calm his nerves, which he accepted willingly.      ____________________________________________   FINAL CLINICAL IMPRESSION(S) / ED DIAGNOSES    Final diagnoses:  Agitation     ED Discharge Orders    None      Portions of this note were generated with  dragon dictation software. Dictation errors may occur despite best attempts at proofreading.   Carrie Mew, MD 08/24/20 [phone]

## 2020-08-24 NOTE — ED Notes (Signed)
E-signature not working at this time. Pt verbalized understanding of D/C instructions, prescriptions and follow up care with no further questions at this time. Pt in NAD and ambulatory at time of D/C. Pt sister to emergency room to pick up patient.

## 2020-08-27 ENCOUNTER — Other Ambulatory Visit: Payer: Self-pay

## 2020-08-27 ENCOUNTER — Emergency Department
Admission: EM | Admit: 2020-08-27 | Discharge: 2020-08-27 | Disposition: A | Discharge status: Home / Self Care | Payer: Medicare Other | Attending: Emergency Medicine | Admitting: Emergency Medicine

## 2020-08-27 ENCOUNTER — Emergency Department: Payer: Medicare Other

## 2020-08-27 DIAGNOSIS — R519 Headache, unspecified: Secondary | ICD-10-CM | POA: Diagnosis not present

## 2020-08-27 DIAGNOSIS — Z7984 Long term (current) use of oral hypoglycemic drugs: Secondary | ICD-10-CM | POA: Insufficient documentation

## 2020-08-27 DIAGNOSIS — F17210 Nicotine dependence, cigarettes, uncomplicated: Secondary | ICD-10-CM | POA: Diagnosis not present

## 2020-08-27 DIAGNOSIS — I10 Essential (primary) hypertension: Secondary | ICD-10-CM | POA: Diagnosis not present

## 2020-08-27 DIAGNOSIS — Z79899 Other long term (current) drug therapy: Secondary | ICD-10-CM | POA: Diagnosis not present

## 2020-08-27 DIAGNOSIS — Z7982 Long term (current) use of aspirin: Secondary | ICD-10-CM | POA: Insufficient documentation

## 2020-08-27 DIAGNOSIS — R42 Dizziness and giddiness: Secondary | ICD-10-CM | POA: Insufficient documentation

## 2020-08-27 DIAGNOSIS — Z7902 Long term (current) use of antithrombotics/antiplatelets: Secondary | ICD-10-CM | POA: Diagnosis not present

## 2020-08-27 DIAGNOSIS — E119 Type 2 diabetes mellitus without complications: Secondary | ICD-10-CM | POA: Diagnosis not present

## 2020-08-27 DIAGNOSIS — I2510 Atherosclerotic heart disease of native coronary artery without angina pectoris: Secondary | ICD-10-CM | POA: Diagnosis not present

## 2020-08-27 DIAGNOSIS — Z794 Long term (current) use of insulin: Secondary | ICD-10-CM | POA: Insufficient documentation

## 2020-08-27 LAB — URINALYSIS, COMPLETE (UACMP) WITH MICROSCOPIC
Bacteria, UA: NONE SEEN
Bilirubin Urine: NEGATIVE
Glucose, UA: NEGATIVE mg/dL
Hgb urine dipstick: NEGATIVE
Ketones, ur: NEGATIVE mg/dL
Leukocytes,Ua: NEGATIVE
Nitrite: NEGATIVE
Protein, ur: NEGATIVE mg/dL
Specific Gravity, Urine: 1.002 — ABNORMAL LOW (ref 1.005–1.030)
Squamous Epithelial / HPF: NONE SEEN (ref 0–5)
WBC, UA: NONE SEEN WBC/hpf (ref 0–5)
pH: 7 (ref 5.0–8.0)

## 2020-08-27 LAB — CBC
HCT: 39.2 % (ref 39.0–52.0)
Hemoglobin: 12.8 g/dL — ABNORMAL LOW (ref 13.0–17.0)
MCH: 28.9 pg (ref 26.0–34.0)
MCHC: 32.7 g/dL (ref 30.0–36.0)
MCV: 88.5 fL (ref 80.0–100.0)
Platelets: 310 10*3/uL (ref 150–400)
RBC: 4.43 MIL/uL (ref 4.22–5.81)
RDW: 14.7 % (ref 11.5–15.5)
WBC: 8.5 10*3/uL (ref 4.0–10.5)
nRBC: 0 % (ref 0.0–0.2)

## 2020-08-27 LAB — BASIC METABOLIC PANEL
Anion gap: 8 (ref 5–15)
BUN: 9 mg/dL (ref 6–20)
CO2: 23 mmol/L (ref 22–32)
Calcium: 8.5 mg/dL — ABNORMAL LOW (ref 8.9–10.3)
Chloride: 105 mmol/L (ref 98–111)
Creatinine, Ser: 0.77 mg/dL (ref 0.61–1.24)
GFR, Estimated: >60 mL/min (ref >60)
Glucose, Bld: 186 mg/dL — ABNORMAL HIGH (ref 70–99)
Potassium: 3.9 mmol/L (ref 3.5–5.1)
Sodium: 136 mmol/L (ref 135–145)

## 2020-08-27 LAB — CBG MONITORING, ED: Glucose-Capillary: 185 mg/dL — ABNORMAL HIGH (ref 70–99)

## 2020-08-27 MED ORDER — LACTATED RINGERS IV BOLUS
1000.0000 mL | Freq: Once | INTRAVENOUS | Status: AC
  Administered 2020-08-27: 1000 mL via INTRAVENOUS

## 2020-08-27 MED ORDER — MAGNESIUM SULFATE 2 GM/50ML IV SOLN
2.0000 g | Freq: Once | INTRAVENOUS | Status: AC
  Administered 2020-08-27: 2 g via INTRAVENOUS
  Filled 2020-08-27: qty 50

## 2020-08-27 MED ORDER — DIPHENHYDRAMINE HCL 50 MG/ML IJ SOLN
50.0000 mg | Freq: Once | INTRAMUSCULAR | Status: AC
  Administered 2020-08-27: 50 mg via INTRAVENOUS
  Filled 2020-08-27: qty 1

## 2020-08-27 NOTE — ED Triage Notes (Signed)
See first nurse note- Pt to ER reports waking up this morning feeling dizzy with a headache. Denies vision issues. Reports he has been without his metformin for over a week, states they didn't have it available at the pharmacy.

## 2020-08-27 NOTE — ED Provider Notes (Signed)
[facility] Emergency Department Provider Note  ____________________________________________   Event Date/Time   First MD Initiated Contact with Patient 08/27/20 1259     (approximate)  I have reviewed the triage vital signs and the nursing notes.   HISTORY  Chief Complaint Dizziness   HPI Michael Schmitt is a 50 y.o. male with past medical history of arthritis, anxiety, CAD, depression, DM, HTN, HDL, RLS and long history of personality disorders agitation and multiple ED visits for SI and agitation who presents for assessment of some dizziness and headache.  Patient states that the symptoms have been going on for at least a year and are not any different today than usual.  He states he just does not understand what is causing him to feel better.  He denies any recent injuries or falls, vision changes,chest pain, cough, shortness of breath, vomiting, diarrhea, dysuria, rash or any other associated sick symptoms.  States he is taking all his medicines as directed although was unable to get a prescription for his metformin he got prescriptions filled for all his other medicines today.  He denies any illicit drug use or EtOH use.  Denies any other acute concerns at this time.         Past Medical History:  Diagnosis Date  . Anxiety   . Anxiety   . Arthritis    cerv. stenosis, spondylosis, HNP- lower back , has been followed in pain clinic, has  had injection s in cerv. area  . Blood dyscrasia    told that when he was young he was a" free bleeder"  . CAD (coronary artery disease)   . Cervical spondylosis without myelopathy 07/24/2014  . Cervicogenic headache 07/24/2014  . Chronic kidney disease    renal calculi- passed spontaneously  . Depression   . Diabetes mellitus without complication ([place])   . Fatty liver   . GERD (gastroesophageal reflux disease)   . Headache(784.0)   . Hyperlipidemia   . Hypertension   . Limb ischemia    right hand due to  ulnar artery obstruction s/p injury  . Mental disorder   . MI, old   . RLS (restless legs syndrome)    detected on sleep study  . Shortness of breath   . Ventricular fibrillation ([place]) 04/05/2016    Patient Active Problem List   Diagnosis Date Noted  . Insomnia 08/22/2020  . 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 Lithium toxicity   . Hyponatremia 01/24/2019  . Substance induced mood disorder ([place]) 11/22/2018  . Alcohol abuse with alcohol-induced mood disorder ([place]) 09/21/2018  . Overdose 05/17/2018  . Depression, major, recurrent, severe with psychosis ([place]) 03/30/2018  . Personality disorder ([place]) 12/31/2017  . MDD (major depressive disorder), recurrent episode, severe ([place]) 11/20/2017  . Dysthymia 10/26/2017  . Major depressive disorder, recurrent episode, moderate ([place]) 03/10/2017  . Generalized anxiety disorder 02/05/2017  . Chronic neck pain (Primary Area of Pain) (Left) 11/22/2016  . Cervical facet syndrome (Left) 11/22/2016  . Cervical radiculitis (Left) 11/22/2016  . History of cervical spinal surgery 11/22/2016  . Cervical spondylosis 11/22/2016  . Chronic shoulder pain (Secondary Area of Pain) (Left) 11/22/2016  . Arthropathy of shoulder (Left) 11/22/2016  . Chronic low back pain [facility] Area of Pain) (Bilateral) (L>R) 11/22/2016  . Lumbar facet syndrome (Bilateral) (L>R) 11/22/2016  . Chronic pain syndrome 11/21/2016  . Long term prescription benzodiazepine use 11/21/2016  . Opiate use 11/21/2016  . Type 2 diabetes mellitus without complication, without  long-term current use of insulin ([place]) 10/19/2016  . Chronic Suicidal ideation 09/15/2016  . Coronary artery disease 09/12/2016  . HTN (hypertension) 06/26/2016  . GERD (gastroesophageal reflux disease) 06/26/2016  . Alcohol use disorder, severe, 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[place]) 06/26/2016  . Benzodiazepine abuse ([place]) 06/11/2016  . Sedative, hypnotic or anxiolytic use disorder, severe, dependence ([place])  05/12/2016  . Ventricular fibrillation ([place]) 04/05/2016  . Combined hyperlipidemia 01/05/2016  . Cervical spondylosis without myelopathy 07/24/2014  . Tobacco use disorder 10/20/2010  . Asthma 10/20/2010    Past Surgical History:  Procedure Laterality Date  . ANTERIOR CERVICAL DECOMP/DISCECTOMY FUSION  11/13/2011   Procedure: ANTERIOR CERVICAL DECOMPRESSION/DISCECTOMY FUSION 1 LEVEL;  Surgeon: [name], MD;  Location: [place] [facility];  Service: Neurosurgery;  Laterality: N/A;  Anterior Cervical Decompression/discectomy Fusion. Cervical three-four.  Marland Kitchen CARDIAC CATHETERIZATION N/A 01/01/2015   Procedure: Left Heart Cath and Coronary Angiography;  Surgeon: [name], MD;  Location: [place] [facility];  Service: Cardiovascular;  Laterality: N/A;  . CARDIAC CATHETERIZATION N/A 04/05/2016   Procedure: Left Heart Cath and Coronary Angiography;  Surgeon: Belva Crome, MD;  Location: [place] [facility];  Service: Cardiovascular;  Laterality: N/A;  . CARDIAC CATHETERIZATION N/A 04/05/2016   Procedure: Coronary Stent Intervention;  Surgeon: Belva Crome, MD;  Location: [place] [facility];  Service: Cardiovascular;  Laterality: N/A;  . CARDIAC CATHETERIZATION N/A 04/05/2016   Procedure: Intravascular Ultrasound/IVUS;  Surgeon: Belva Crome, MD;  Location: [place] [facility];  Service: Cardiovascular;  Laterality: N/A;  . NASAL SINUS SURGERY     2005    Prior to Admission medications   Medication Sig Start Date End Date Taking? Authorizing Provider  albuterol (VENTOLIN HFA) 108 (90 Base) MCG/ACT inhaler Inhale 2 puffs into the lungs every 6 (six) hours as needed. 10/10/19 10/09/20  [provider]  aspirin 81 MG EC tablet Take 1 tablet (81 mg total) by mouth daily. For heart health Patient not taking: No sig reported 11/21/17   Lindell Spar I, NP  atorvastatin (LIPITOR) 20 MG tablet Take 20 mg by mouth daily. 12/16/19   [provider]  baclofen (LIORESAL) 10 MG tablet Take 10 mg by  mouth 3 (three) times daily. 07/06/20   [provider]  carvedilol (COREG) 6.25 MG tablet Take 6.25 mg by mouth 2 (two) times daily with a meal. 10/25/18 08/21/20  [provider]  chlorproMAZINE (THORAZINE) 200 MG tablet Take 600 mg by mouth at bedtime. Patient not taking: Reported on 08/21/2020 11/17/19   [provider]  clopidogrel (PLAVIX) 75 MG tablet Take 75 mg by mouth daily. 12/16/19   [provider]  desvenlafaxine (PRISTIQ) 100 MG 24 hr tablet Take 100 mg by mouth daily. Patient not taking: Reported on 08/21/2020 05/24/20   [provider]  DULoxetine (CYMBALTA) 60 MG capsule Take 60 mg by mouth every morning. Patient not taking: Reported on 08/21/2020 07/06/20   [provider]  gabapentin (NEURONTIN) 800 MG tablet Take 800 mg by mouth 3 (three) times daily. 07/06/20   [provider]  hydrOXYzine (VISTARIL) 100 MG capsule Take 100 mg by mouth at bedtime.    [provider]  INGREZZA 80 MG capsule Take 80 mg by mouth daily. 04/30/20   [provider]  lisinopril (ZESTRIL) 10 MG tablet Take 10 mg by mouth daily. 12/16/19   [provider]  metFORMIN (GLUCOPHAGE) 1000 MG tablet Take 1,000 mg by mouth 2 (two) times daily. 10/02/18  [provider]  mirtazapine (REMERON) 15 MG tablet Take 7.5 mg by mouth at bedtime. 07/06/20   [provider]  pantoprazole (PROTONIX) 40 MG tablet Take 1 tablet by mouth daily. 08/18/20   [provider]  QUEtiapine (SEROQUEL) 300 MG tablet Take 2 tablets (600 mg total) by mouth at bedtime. 08/24/20   Clapacs, Madie Reno, MD  REXULTI 1 MG TABS tablet Take 1 mg by mouth daily. 03/26/20   [provider]  rOPINIRole (REQUIP) 0.5 MG tablet Take 0.5 mg by mouth at bedtime. 07/07/20   [provider]  venlafaxine XR (EFFEXOR XR) 75 MG 24 hr capsule Take 1 capsule (75 mg total) by mouth daily. 08/24/20 08/24/21  Clapacs, Madie Reno, MD     Allergies Prednisone, Amoxicillin, Hydrocodone, Varenicline, Other, and Trazodone and nefazodone  Family History  Problem Relation Age of Onset  . Prostate cancer Father   . Hypertension Mother   . Kidney Stones Mother   . Anxiety disorder Mother   . Depression Mother   . COPD Sister   . Hypertension Sister   . Diabetes Sister   . Depression Sister   . Anxiety disorder Sister   . Seizures Sister   . ADD / ADHD Son   . ADD / ADHD Daughter     Social History Social History   Tobacco Use  . Smoking status: Current Every Day Smoker    Packs/day: 2.00    Years: 17.00    Pack years: 34.00    Types: Cigarettes, Cigars  . Smokeless tobacco: Former Systems developer  . Tobacco comment: occassional snuff   Vaping Use  . Vaping Use: Never used  Substance Use Topics  . Alcohol use: No    Alcohol/week: 0.0 standard drinks    Comment: last drinked in 3 months.   . Drug use: No    Review of Systems  Review of Systems  Constitutional: Negative for chills and fever.  HENT: Negative for sore throat.   Eyes: Negative for pain.  Respiratory: Negative for cough and stridor.   Cardiovascular: Negative for chest pain.  Gastrointestinal: Negative for vomiting.  Genitourinary: Negative for dysuria.  Musculoskeletal: Negative for myalgias.  Skin: Negative for rash.  Neurological: Positive for dizziness and headaches. Negative for seizures and loss of consciousness.  Psychiatric/Behavioral: Negative for suicidal ideas.  All other systems reviewed and are negative.     ____________________________________________   PHYSICAL EXAM:  VITAL SIGNS: ED Triage Vitals [08/27/20 1151]  Enc Vitals Group     BP (!) 127/102     Pulse Rate 93     Resp 18     Temp 97.9 F (36.6 C)     Temp Source Oral     SpO2 98 %     Weight 190 lb (86.2 kg)     Height '6\' 2"'$  (1.88 m)     Head Circumference      Peak Flow      Pain Score 10     Pain Loc      Pain Edu?      Excl. in [place]?    Vitals:    08/27/20 1304 08/27/20 1312  BP: (!) 135/95   Pulse: 88 82  Resp: 16 17  Temp:    SpO2: 100% 100%   Physical Exam Vitals and nursing note reviewed.  Constitutional:      Appearance: He is well-developed.  HENT:     Head: Normocephalic and atraumatic.     Right Ear: External  ear normal.     Left Ear: External ear normal.     Nose: Nose normal.  Eyes:     Conjunctiva/sclera: Conjunctivae normal.  Cardiovascular:     Rate and Rhythm: Normal rate and regular rhythm.     Heart sounds: No murmur heard.   Pulmonary:     Effort: Pulmonary effort is normal. No respiratory distress.     Breath sounds: Normal breath sounds.  Abdominal:     Palpations: Abdomen is soft.     Tenderness: There is no abdominal tenderness.  Musculoskeletal:     Cervical back: Neck supple.  Skin:    General: Skin is warm and dry.     Capillary Refill: Capillary refill takes less than 2 seconds.  Neurological:     Mental Status: He is alert and oriented to person, place, and time.  Psychiatric:        Mood and Affect: Mood normal.     Cranial nerves II through XII grossly intact.  No pronator drift.  No finger dysmetria.  Symmetric 5/5 strength of all extremities.  Sensation intact to light touch in all extremities.  Unremarkable unassisted gait.  Patient has persistent gyrating movements of his head and his neck.  ____________________________________________   LABS (all labs ordered are listed, but only abnormal results are displayed)  Labs Reviewed  BASIC METABOLIC PANEL - Abnormal; Notable for the following components:      Result Value   Glucose, Bld 186 (*)    Calcium 8.5 (*)    All other components within normal limits  CBC - Abnormal; Notable for the following components:   Hemoglobin 12.8 (*)    All other components within normal limits  CBG MONITORING, ED - Abnormal; Notable for the following components:   Glucose-Capillary 185 (*)    All other components within normal limits   URINALYSIS, COMPLETE (UACMP) WITH MICROSCOPIC   ____________________________________________  EKG  ____________________________________________  RADIOLOGY  ED MD interpretation: No acute intracranial abnormality.  Official radiology report(s): CT Head Wo Contrast  Result Date: 08/27/2020 CLINICAL DATA:  Headache, neck pain and dizziness. EXAM: CT HEAD WITHOUT CONTRAST TECHNIQUE: Contiguous axial images were obtained from the base of the skull through the vertex without intravenous contrast. COMPARISON:  None. FINDINGS: Brain: The brain shows a normal appearance without evidence of malformation, atrophy, old or acute small or large vessel infarction, mass lesion, hemorrhage, hydrocephalus or extra-axial collection. Vascular: No hyperdense vessel. No evidence of atherosclerotic calcification. Skull: Normal.  No traumatic finding.  No focal bone lesion. Sinuses/Orbits: Sinuses are clear. Orbits appear normal. Mastoids are clear. Other: None significant IMPRESSION: Normal head CT Electronically Signed   By: Nelson Chimes M.D.   On: 08/27/2020 14:06    ____________________________________________   PROCEDURES  Procedure(s) performed (including Critical Care):  Procedures   ____________________________________________   INITIAL IMPRESSION / ASSESSMENT AND PLAN / ED COURSE      Patient presents for assessment of chronic dizziness and lightheadedness.  He has not able to further characterize the symptoms although it is clear that there been no changes of today over the last couple weeks and that the symptoms were ongoing for a long time but he is frustrated because he is not sure what is causing them and feels he is getting better.  He has tried over-the-counter pain meds did not help.  On arrival he is afebrile hemodynamically stable.  He does have some diarrhea movements of his head on exam consistent with some choreoathetosis but no  focal deficits.  He is alert oriented and does not  appear psychotic and is not suicidal or homicidal.  Given duration of symptoms and concurrence with ongoing head movements certainly possible that his headache and dizziness are related to this.  There are no history exam features to suggest acute CVA, acute infectious process in the head or neck or recent trauma.  Very low suspicion for venous sinus thrombosis or acute metabolic derangement.  CT head is unremarkable for any evidence of acute intracranial abnormality mass-effect or elevated cranial pressures.  Given stable vitals with nonfocal neuro exam and patient worsening symptoms lasting greater than the past year I think he is safe for discharge with plan for close outpatient follow-up.  Low provide referral for neurology.  Discharged stable condition.  Strict return precautions advised and discussed.       ____________________________________________   FINAL CLINICAL IMPRESSION(S) / ED DIAGNOSES  Final diagnoses:  Dizziness  Nonintractable headache, unspecified chronicity pattern, unspecified headache type    Medications  lactated ringers bolus 1,000 mL (1,000 mLs Intravenous New Bag/Given 08/27/20 1319)  magnesium sulfate IVPB 2 g 50 mL (0 g Intravenous Stopped 08/27/20 1433)  diphenhydrAMINE (BENADRYL) injection 50 mg (50 mg Intravenous Given 08/27/20 1320)     ED Discharge Orders    None       Note:  This document was prepared using Dragon voice recognition software and may include unintentional dictation errors.   Lucrezia Starch, MD 08/27/20 [id number]

## 2020-08-27 NOTE — ED Notes (Signed)
cbg 185

## 2020-08-27 NOTE — ED Notes (Addendum)
Says neck pain--back of neck and dizzy.  Says dizziness is not new.  Says he always has neck pain as well. Since surgery in 2013 He is alert and oriented.  No distress.  Has rolling movements of head continually.

## 2020-08-27 NOTE — ED Triage Notes (Signed)
First Nurse Note:  C/O headache.  Patient has been out of medications for one week.  Per EMS  AAOx3.  Skin warm and dry. NAD

## 2020-10-05 ENCOUNTER — Other Ambulatory Visit: Payer: Self-pay | Admitting: Psychiatry

## 2020-10-18 ENCOUNTER — Emergency Department
Admission: EM | Admit: 2020-10-18 | Discharge: 2020-10-18 | Disposition: A | Discharge status: Home / Self Care | Payer: Medicare Other | Attending: Emergency Medicine | Admitting: Emergency Medicine

## 2020-10-18 ENCOUNTER — Emergency Department: Payer: Medicare Other

## 2020-10-18 ENCOUNTER — Encounter: Payer: Self-pay | Admitting: Radiology

## 2020-10-18 ENCOUNTER — Other Ambulatory Visit: Payer: Self-pay

## 2020-10-18 DIAGNOSIS — R0789 Other chest pain: Secondary | ICD-10-CM | POA: Diagnosis present

## 2020-10-18 DIAGNOSIS — Z7902 Long term (current) use of antithrombotics/antiplatelets: Secondary | ICD-10-CM | POA: Insufficient documentation

## 2020-10-18 DIAGNOSIS — Z7984 Long term (current) use of oral hypoglycemic drugs: Secondary | ICD-10-CM | POA: Insufficient documentation

## 2020-10-18 DIAGNOSIS — Y900 Blood alcohol level of less than 20 mg/100 ml: Secondary | ICD-10-CM | POA: Insufficient documentation

## 2020-10-18 DIAGNOSIS — J45909 Unspecified asthma, uncomplicated: Secondary | ICD-10-CM | POA: Diagnosis not present

## 2020-10-18 DIAGNOSIS — E1122 Type 2 diabetes mellitus with diabetic chronic kidney disease: Secondary | ICD-10-CM | POA: Diagnosis not present

## 2020-10-18 DIAGNOSIS — Z79899 Other long term (current) drug therapy: Secondary | ICD-10-CM | POA: Diagnosis not present

## 2020-10-18 DIAGNOSIS — Z7982 Long term (current) use of aspirin: Secondary | ICD-10-CM | POA: Insufficient documentation

## 2020-10-18 DIAGNOSIS — R45851 Suicidal ideations: Secondary | ICD-10-CM | POA: Diagnosis not present

## 2020-10-18 DIAGNOSIS — I2510 Atherosclerotic heart disease of native coronary artery without angina pectoris: Secondary | ICD-10-CM | POA: Insufficient documentation

## 2020-10-18 DIAGNOSIS — I129 Hypertensive chronic kidney disease with stage 1 through stage 4 chronic kidney disease, or unspecified chronic kidney disease: Secondary | ICD-10-CM | POA: Diagnosis not present

## 2020-10-18 DIAGNOSIS — F17210 Nicotine dependence, cigarettes, uncomplicated: Secondary | ICD-10-CM | POA: Diagnosis not present

## 2020-10-18 DIAGNOSIS — F333 Major depressive disorder, recurrent, severe with psychotic symptoms: Secondary | ICD-10-CM | POA: Insufficient documentation

## 2020-10-18 DIAGNOSIS — N189 Chronic kidney disease, unspecified: Secondary | ICD-10-CM | POA: Insufficient documentation

## 2020-10-18 DIAGNOSIS — R079 Chest pain, unspecified: Secondary | ICD-10-CM

## 2020-10-18 LAB — CBC
HCT: 39 % (ref 39.0–52.0)
Hemoglobin: 13.1 g/dL (ref 13.0–17.0)
MCH: 30.1 pg (ref 26.0–34.0)
MCHC: 33.6 g/dL (ref 30.0–36.0)
MCV: 89.7 fL (ref 80.0–100.0)
Platelets: 218 10*3/uL (ref 150–400)
RBC: 4.35 MIL/uL (ref 4.22–5.81)
RDW: 14.3 % (ref 11.5–15.5)
WBC: 9.9 10*3/uL (ref 4.0–10.5)
nRBC: 0 % (ref 0.0–0.2)

## 2020-10-18 LAB — BASIC METABOLIC PANEL
Anion gap: 9 (ref 5–15)
BUN: 6 mg/dL (ref 6–20)
CO2: 23 mmol/L (ref 22–32)
Calcium: 9 mg/dL (ref 8.9–10.3)
Chloride: 101 mmol/L (ref 98–111)
Creatinine, Ser: 0.75 mg/dL (ref 0.61–1.24)
GFR, Estimated: >60 mL/min (ref >60)
Glucose, Bld: 293 mg/dL — ABNORMAL HIGH (ref 70–99)
Potassium: 4.3 mmol/L (ref 3.5–5.1)
Sodium: 133 mmol/L — ABNORMAL LOW (ref 135–145)

## 2020-10-18 LAB — TROPONIN I (HIGH SENSITIVITY)
Troponin I (High Sensitivity): 5 ng/L (ref <18)
Troponin I (High Sensitivity): 6 ng/L (ref <18)

## 2020-10-18 LAB — ETHANOL: Alcohol, Ethyl (B): <10 mg/dL (ref <10)

## 2020-10-18 MED ORDER — LORAZEPAM 1 MG PO TABS
1.0000 mg | ORAL_TABLET | Freq: Once | ORAL | Status: AC
  Administered 2020-10-18: 1 mg via ORAL
  Filled 2020-10-18: qty 1

## 2020-10-18 NOTE — ED Notes (Signed)
Pt can be heard screaming "goddammit!" while in the room. EDP aware of patient behaviors at this time.

## 2020-10-18 NOTE — ED Notes (Signed)
Patient to xray with security.

## 2020-10-18 NOTE — ED Notes (Signed)
Repeat labs collected by this RN. Pt states that he wants someone to come sit in his room with him and that's why he is screaming. This RN explained unable to provide someone to sit in room with him. Pt states he is hurting, this RN explained that slapping himself and pacing seemed unproductive to preventing pain/assisting patient with sleep. Pt able to have coherent conversation with this RN.

## 2020-10-18 NOTE — ED Notes (Signed)
Patient agreeable to lab work and EKG. Patient educated process and policies for [facility] regarding reports of SI and safety. Patient verbalized understanding.

## 2020-10-18 NOTE — ED Notes (Signed)
Pt noted to be screaming and slapping himself at this time. Pt able to be redirected when this RN comes to bedside. Medication administered per order. Pt calm and cooperative when interacting with staff. Pt ambulatory to the bathroom at this time.

## 2020-10-18 NOTE — ED Notes (Signed)
Pt screaming in lobby. Security next to pt monitoring.

## 2020-10-18 NOTE — ED Notes (Signed)
Patient refusing to dress out or allow RN to obtain vitals or blood pressure without seeing a doctor. This RN notified Dr. Charna Archer at this time.

## 2020-10-18 NOTE — ED Notes (Signed)
Charge and first nurse made aware of patient SI. Security at bedside.

## 2020-10-18 NOTE — ED Notes (Signed)
Per ems pt with SI and chest pain today. Per ems pt with normal 12 lead ekg and vital signs. Per ems pt states he wants to take a hammer to his head and kill himself.

## 2020-10-18 NOTE — ED Notes (Signed)
ED Provider at bedside. 

## 2020-10-18 NOTE — ED Notes (Signed)
E-signature not working at this time. Pt verbalized understanding of D/C instructions, prescriptions and follow up care with no further questions at this time. Pt in NAD and ambulatory at time of D/C.  

## 2020-10-18 NOTE — ED Provider Notes (Addendum)
[facility] Emergency Department Provider Note   ____________________________________________   Event Date/Time   First MD Initiated Contact with Patient 10/18/20 0142     (approximate)  I have reviewed the triage vital signs and the nursing notes.   HISTORY  Chief Complaint Suicidal    HPI Michael Schmitt is a 50 y.o. male with past medical history of hypertension, hyperlipidemia, diabetes, CAD, asthma, and generalized anxiety disorder who presents to the ED complaining of suicidal ideation and chest pain.  Patient reports that he has been increasingly depressed for the past couple of weeks, has now begun to feel suicidal.  He denies any specific plan, but states he needs medication to help him calm down because he is feeling extremely anxious.  He denies any alcohol or drug use.  He does endorse pain in his chest for the past 24 hours, describes it as a constant pressure that is not exacerbated or alleviated by anything in particular.  He has not had any fevers, cough, or shortness of breath.        Past Medical History:  Diagnosis Date   Anxiety    Anxiety    Arthritis    cerv. stenosis, spondylosis, HNP- lower back , has been followed in pain clinic, has  had injection s in cerv. 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   Mental disorder    MI, old    RLS (restless legs syndrome)    detected on sleep study   Shortness of breath    Ventricular fibrillation ([place]) 04/05/2016     Patient Active Problem List   Diagnosis Date Noted   Insomnia 08/22/2020   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   Lithium toxicity    Hyponatremia 01/24/2019   Substance induced mood disorder ([place]) 11/22/2018   Alcohol abuse with alcohol-induced mood disorder ([place]) 09/21/2018   Overdose 05/17/2018   Depression, major, recurrent, severe with psychosis ([place]) 03/30/2018   Personality disorder ([place]) 12/31/2017   MDD (major depressive disorder), recurrent episode, severe ([place]) 11/20/2017   Dysthymia 10/26/2017   Major depressive disorder, recurrent episode, moderate (HCC) 03/10/2017   Generalized anxiety disorder 02/05/2017   Chronic neck pain (Primary Area of Pain) (Left) 11/22/2016   Cervical facet syndrome (Left) 11/22/2016   Cervical radiculitis (Left) 11/22/2016   History of cervical spinal surgery 11/22/2016   Cervical spondylosis 11/22/2016   Chronic shoulder pain (Secondary Area of Pain) (Left) 11/22/2016   Arthropathy of shoulder (Left) 11/22/2016   Chronic low back pain [facility] Area of Pain) (Bilateral) (L>R) 11/22/2016   Lumbar facet syndrome (Bilateral) (L>R) 11/22/2016   Chronic pain syndrome 11/21/2016   Long term prescription benzodiazepine use 11/21/2016   Opiate use 11/21/2016   Type 2 diabetes mellitus without complication, without long-term current use of insulin ([place]) 10/19/2016   Chronic Suicidal ideation 09/15/2016   Coronary artery disease 09/12/2016   HTN (hypertension) 06/26/2016   GERD (gastroesophageal reflux disease)  06/26/2016   Alcohol use disorder, severe, 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[place]) 06/26/2016   Benzodiazepine abuse ([place]) 06/11/2016   Sedative, hypnotic or anxiolytic use disorder, severe, dependence ([place]) 05/12/2016   Ventricular fibrillation ([place]) 04/05/2016   Combined hyperlipidemia 01/05/2016   Cervical spondylosis without myelopathy 07/24/2014   Tobacco use disorder 10/20/2010   Asthma 10/20/2010    Past Surgical History:   Procedure Laterality Date   ANTERIOR CERVICAL DECOMP/DISCECTOMY FUSION  11/13/2011   Procedure: ANTERIOR CERVICAL DECOMPRESSION/DISCECTOMY FUSION 1 LEVEL;  Surgeon: [name], MD;  Location: [place] [facility]UR[facility] ORS;  Service: Neurosurgery;  Laterality: N/A;  Anterior Cervical Decompression/discectomy Fusion. Cervical three-four.   CARDIAC CATHETERIZATION N/A 01/01/2015   Procedure: Left Heart Cath and Coronary Angiography;  Surgeon: [name], MD;  Location: [place] CV LAB;  Service: Cardiovascular;  Laterality: N/A;   CARDIAC CATHETERIZATION N/A 04/05/2016   Procedure: Left Heart Cath and Coronary Angiography;  Surgeon: Belva Crome, MD;  Location: [place] CV LAB;  Service: Cardiovascular;  Laterality: N/A;   CARDIAC CATHETERIZATION N/A 04/05/2016   Procedure: Coronary Stent Intervention;  Surgeon: Belva Crome, MD;  Location: [place] CV LAB;  Service: Cardiovascular;  Laterality: N/A;   CARDIAC CATHETERIZATION N/A 04/05/2016   Procedure: Intravascular Ultrasound/IVUS;  Surgeon: Belva Crome, MD;  Location: [place] CV LAB;  Service: Cardiovascular;  Laterality: N/A;   NASAL SINUS SURGERY     2005    Prior to Admission medications   Medication Sig Start Date End Date Taking? Authorizing Provider  aspirin 81 MG EC tablet Take 1 tablet (81 mg total) by mouth daily. For heart health Patient not taking: No sig reported 11/21/17   Lindell Spar I, NP  atorvastatin (LIPITOR) 20 MG tablet Take 20 mg by mouth daily. 12/16/19   [provider]  baclofen (LIORESAL) 10 MG tablet Take 10 mg by mouth 3 (three) times daily. 07/06/20   [provider]  carvedilol (COREG) 6.25 MG tablet Take 6.25 mg by mouth 2 (two) times daily with a meal. 10/25/18 08/21/20  [provider]  chlorproMAZINE (THORAZINE) 200 MG tablet Take 600 mg by mouth at bedtime. Patient not taking: Reported on 08/21/2020 11/17/19   [provider]  clopidogrel (PLAVIX) 75 MG tablet Take 75 mg by  mouth daily. 12/16/19   [provider]  desvenlafaxine (PRISTIQ) 100 MG 24 hr tablet Take 100 mg by mouth daily. Patient not taking: Reported on 08/21/2020 05/24/20   [provider]  DULoxetine (CYMBALTA) 60 MG capsule Take 60 mg by mouth every morning. Patient not taking: Reported on 08/21/2020 07/06/20   [provider]  gabapentin (NEURONTIN) 800 MG tablet Take 800 mg by mouth 3 (three) times daily. 07/06/20   [provider]  hydrOXYzine (VISTARIL) 100 MG capsule Take 100 mg by mouth at bedtime.    [provider]  INGREZZA 80 MG capsule Take 80 mg by mouth daily. 04/30/20   [provider]  lisinopril (ZESTRIL) 10 MG tablet Take 10 mg by mouth daily. 12/16/19   [provider]  metFORMIN (GLUCOPHAGE) 1000 MG tablet Take 1,000 mg by mouth 2 (two) times daily. 10/02/18   [provider]  mirtazapine (REMERON) 15 MG tablet Take 7.5 mg by mouth at bedtime. 07/06/20   [provider]  pantoprazole (PROTONIX) 40 MG tablet Take 1 tablet by mouth daily. 08/18/20   [provider]  QUEtiapine (SEROQUEL) 300 MG tablet Take 2 tablets (600 mg total) by mouth at bedtime.  08/24/20   Clapacs, Madie Reno, MD  REXULTI 1 MG TABS tablet Take 1 mg by mouth daily. 03/26/20   [provider]  rOPINIRole (REQUIP) 0.5 MG tablet Take 0.5 mg by mouth at bedtime. 07/07/20   [provider]  venlafaxine XR (EFFEXOR XR) 75 MG 24 hr capsule Take 1 capsule (75 mg total) by mouth daily. 08/24/20 08/24/21  Clapacs, Madie Reno, MD    Allergies Prednisone, Amoxicillin, Hydrocodone, Varenicline, Other, and Trazodone and nefazodone  Family History  Problem Relation Age of Onset   Prostate cancer Father    Hypertension Mother    Kidney Stones Mother    Anxiety disorder Mother    Depression Mother    COPD Sister    Hypertension Sister    Diabetes Sister    Depression Sister    Anxiety disorder Sister    Seizures Sister    ADD / ADHD  Son    ADD / ADHD Daughter     Social History Social History   Tobacco Use   Smoking status: Every Day    Packs/day: 2.00    Years: 17.00    Pack years: 34.00    Types: Cigarettes, Cigars   Smokeless tobacco: Former   Tobacco comments:    occassional snuff   Vaping Use   Vaping Use: Never used  Substance Use Topics   Alcohol use: No    Alcohol/week: 0.0 standard drinks    Comment: last drinked in 3 months.    Drug use: No    Review of Systems  Constitutional: No fever/chills Eyes: No visual changes. ENT: No sore throat. Cardiovascular: Positive for chest pain. Respiratory: Denies shortness of breath. Gastrointestinal: No abdominal pain.  No nausea, no vomiting.  No diarrhea.  No constipation. Genitourinary: Negative for dysuria. Musculoskeletal: Negative for back pain. Skin: Negative for rash. Neurological: Negative for headaches, focal weakness or numbness.  Positive for suicidal ideation.  ____________________________________________   PHYSICAL EXAM:  VITAL SIGNS: ED Triage Vitals  Enc Vitals Group     BP 10/18/20 0043 140/86     Pulse Rate 10/18/20 0043 (!) 103     Resp 10/18/20 0043 18     Temp 10/18/20 0043 (!) 97.5 F (36.4 C)     Temp Source 10/18/20 0043 Oral     SpO2 10/18/20 0043 97 %     Weight 10/18/20 0036 200 lb (90.7 kg)     Height 10/18/20 0036 '6\' 2"'$  (1.88 m)     Head Circumference --      Peak Flow --      Pain Score 10/18/20 0036 10     Pain Loc --      Pain Edu? --      Excl. in [place]? --     Constitutional: Alert and oriented. Eyes: Conjunctivae are normal. Head: Atraumatic. Nose: No congestion/rhinnorhea. Mouth/Throat: Mucous membranes are moist. Neck: Normal ROM Cardiovascular: Normal rate, regular rhythm. Grossly normal heart sounds. Respiratory: Normal respiratory effort.  No retractions. Lungs CTAB. Gastrointestinal: Soft and nontender. No distention. Genitourinary: deferred Musculoskeletal: No lower extremity tenderness  nor edema. Neurologic:  Normal speech and language. No gross focal neurologic deficits are appreciated. Skin:  Skin is warm, dry and intact. No rash noted. Psychiatric: Mood and affect are normal. Speech and behavior are normal.  ____________________________________________   LABS (all labs ordered are listed, but only abnormal results are displayed)  Labs Reviewed  BASIC METABOLIC PANEL - Abnormal; Notable for the following components:  Result Value   Sodium 133 (*)    Glucose, Bld 293 (*)    All other components within normal limits  RESP PANEL BY RT-PCR (FLU A&B, COVID) ARPGX2  CBC  ETHANOL  URINE DRUG SCREEN, QUALITATIVE ([facility] ONLY)  TROPONIN I (HIGH SENSITIVITY)  TROPONIN I (HIGH SENSITIVITY)   ____________________________________________  EKG  ED ECG REPORT I, Blake Divine, the attending physician, personally viewed and interpreted this ECG.   Date: 10/18/2020  EKG Time: 00:42  Rate: 106  Rhythm: sinus tachycardia  Axis: RAD  Intervals:none  ST&T Change: None   PROCEDURES  Procedure(s) performed (including Critical Care):  Procedures   ____________________________________________   INITIAL IMPRESSION / ASSESSMENT AND PLAN / ED COURSE      50 year old male with past medical history of hypertension, hyperlipidemia, diabetes, CAD, asthma, and generalized anxiety disorder who presents to the ED complaining of worsening depression associated with suicidal ideation for the past couple of weeks.  Patient is calm and cooperative, seems interested in receiving treatment and we will maintain voluntary status for now.  Plan for further evaluation by psychiatry once patient is medically cleared.  He does complain of chest pain, EKG shows no evidence of arrhythmia or ischemia and initial troponin is negative.  Chest x-ray reviewed by me and shows no infiltrate, edema, or effusion.  Remainder of labs are unremarkable, we will repeat troponin but if this is negative  I doubt ACS.  Low suspicion for PE given reassuring vital signs.  Repeat troponin within normal limits, patient may be a medically cleared for psychiatric disposition.  The patient has been placed in psychiatric observation due to the need to provide a safe environment for the patient while obtaining psychiatric consultation and evaluation, as well as ongoing medical and medication management to treat the patient's condition.  The patient has not been placed under full IVC at this time.  ----------------------------------------- 6:38 AM on 10/18/2020 ----------------------------------------- Patient is now requesting to be discharged home, states "my mom is having an operation today and I want to be there."  He now denies any suicidal or homicidal ideation, does not represent a threat to himself or others.  He is well-known to psychiatry service with numerous presentations for vague suicidal ideation.  Given he states he is not actively suicidal, there is no indication for IVC and he is appropriate for discharge home with outpatient psychiatry follow-up.  Patient counseled to return to the ED for new worsening symptoms, patient agrees with plan.       ____________________________________________   FINAL CLINICAL IMPRESSION(S) / ED DIAGNOSES  Final diagnoses:  Suicidal ideation  Chest pain, unspecified type     ED Discharge Orders     None        Note:  This document was prepared using Dragon voice recognition software and may include unintentional dictation errors.    Blake Divine, MD 10/18/20 [id number]    Blake Divine, MD 10/18/20 [phone]

## 2020-10-18 NOTE — ED Triage Notes (Signed)
Patient arrives to ED via EMS. Patient reports suicidal thoughts "for a while". Patient reports chest pain as well. Patient reports chest pain started today. Patient cooperative in triage, but expressing concern about wait time. Patient encouraged to wait to see a provider.

## 2020-10-18 NOTE — BH Assessment (Signed)
Patient sleep on approach. Patient is well known to this er and er's in the surrounding area with similar presentation.  Based on pt's erratic and aggressive behaviors, writer felt it best to let patient remain sleeping. Per chart review, patient has a hx of acting out in the context of obtaining desired drugs. Tonight he has requested ativan.   Patient should be assessed in the am when fully awake.

## 2020-10-18 NOTE — ED Notes (Signed)
Pt. Belongings:  Red shirt Beige pants Brown belt White socks  Black and blue tennis shoes Pack of cigarettes  Lighter

## 2020-10-18 NOTE — ED Notes (Signed)
VOL/pending psych consult 

## 2020-10-18 NOTE — ED Notes (Signed)
Pt provided with water at this time.

## 2020-11-05 ENCOUNTER — Other Ambulatory Visit: Payer: Self-pay | Admitting: Physical Medicine & Rehabilitation

## 2020-11-05 DIAGNOSIS — M542 Cervicalgia: Secondary | ICD-10-CM

## 2020-11-09 ENCOUNTER — Ambulatory Visit: Payer: Medicare Other

## 2020-11-19 ENCOUNTER — Ambulatory Visit: Admission: RE | Admit: 2020-11-19 | Payer: Medicare Other | Source: Ambulatory Visit

## 2020-11-24 ENCOUNTER — Ambulatory Visit
Admission: RE | Admit: 2020-11-24 | Discharge: 2020-11-24 | Disposition: A | Discharge status: Home / Self Care | Payer: Medicare Other | Source: Ambulatory Visit | Attending: Physical Medicine & Rehabilitation | Admitting: Physical Medicine & Rehabilitation

## 2020-11-24 ENCOUNTER — Other Ambulatory Visit: Payer: Self-pay

## 2020-11-24 DIAGNOSIS — M542 Cervicalgia: Secondary | ICD-10-CM

## 2020-12-08 ENCOUNTER — Other Ambulatory Visit: Payer: Self-pay

## 2020-12-08 ENCOUNTER — Ambulatory Visit
Admission: RE | Admit: 2020-12-08 | Discharge: 2020-12-08 | Disposition: A | Discharge status: Home / Self Care | Payer: Medicare Other | Source: Ambulatory Visit | Attending: Physical Medicine & Rehabilitation | Admitting: Physical Medicine & Rehabilitation

## 2020-12-08 DIAGNOSIS — M542 Cervicalgia: Secondary | ICD-10-CM | POA: Insufficient documentation

## 2020-12-10 ENCOUNTER — Other Ambulatory Visit: Payer: Self-pay | Admitting: Neurology

## 2020-12-10 DIAGNOSIS — R270 Ataxia, unspecified: Secondary | ICD-10-CM

## 2020-12-16 ENCOUNTER — Other Ambulatory Visit: Payer: Self-pay

## 2020-12-16 ENCOUNTER — Ambulatory Visit
Admission: RE | Admit: 2020-12-16 | Discharge: 2020-12-16 | Disposition: A | Discharge status: Home / Self Care | Payer: Medicare Other | Source: Ambulatory Visit | Attending: Neurology | Admitting: Neurology

## 2020-12-16 DIAGNOSIS — R270 Ataxia, unspecified: Secondary | ICD-10-CM | POA: Diagnosis present

## 2021-01-12 ENCOUNTER — Emergency Department
Admission: EM | Admit: 2021-01-12 | Discharge: 2021-01-12 | Disposition: A | Discharge status: Home / Self Care | Payer: Medicare Other | Attending: Emergency Medicine | Admitting: Emergency Medicine

## 2021-01-12 ENCOUNTER — Emergency Department: Payer: Medicare Other

## 2021-01-12 ENCOUNTER — Encounter: Payer: Self-pay | Admitting: *Deleted

## 2021-01-12 ENCOUNTER — Other Ambulatory Visit: Payer: Self-pay

## 2021-01-12 DIAGNOSIS — Z7984 Long term (current) use of oral hypoglycemic drugs: Secondary | ICD-10-CM | POA: Diagnosis not present

## 2021-01-12 DIAGNOSIS — E1122 Type 2 diabetes mellitus with diabetic chronic kidney disease: Secondary | ICD-10-CM | POA: Insufficient documentation

## 2021-01-12 DIAGNOSIS — Z7902 Long term (current) use of antithrombotics/antiplatelets: Secondary | ICD-10-CM | POA: Insufficient documentation

## 2021-01-12 DIAGNOSIS — Y900 Blood alcohol level of less than 20 mg/100 ml: Secondary | ICD-10-CM | POA: Insufficient documentation

## 2021-01-12 DIAGNOSIS — J45909 Unspecified asthma, uncomplicated: Secondary | ICD-10-CM | POA: Diagnosis not present

## 2021-01-12 DIAGNOSIS — R4182 Altered mental status, unspecified: Secondary | ICD-10-CM | POA: Diagnosis present

## 2021-01-12 DIAGNOSIS — Z7982 Long term (current) use of aspirin: Secondary | ICD-10-CM | POA: Diagnosis not present

## 2021-01-12 DIAGNOSIS — I129 Hypertensive chronic kidney disease with stage 1 through stage 4 chronic kidney disease, or unspecified chronic kidney disease: Secondary | ICD-10-CM | POA: Insufficient documentation

## 2021-01-12 DIAGNOSIS — F17210 Nicotine dependence, cigarettes, uncomplicated: Secondary | ICD-10-CM | POA: Insufficient documentation

## 2021-01-12 DIAGNOSIS — N189 Chronic kidney disease, unspecified: Secondary | ICD-10-CM | POA: Diagnosis not present

## 2021-01-12 DIAGNOSIS — I2510 Atherosclerotic heart disease of native coronary artery without angina pectoris: Secondary | ICD-10-CM | POA: Insufficient documentation

## 2021-01-12 DIAGNOSIS — Z79899 Other long term (current) drug therapy: Secondary | ICD-10-CM | POA: Insufficient documentation

## 2021-01-12 LAB — COMPREHENSIVE METABOLIC PANEL
ALT: 14 U/L (ref 0–44)
AST: 20 U/L (ref 15–41)
Albumin: 4.1 g/dL (ref 3.5–5.0)
Alkaline Phosphatase: 58 U/L (ref 38–126)
Anion gap: 9 (ref 5–15)
BUN: 10 mg/dL (ref 6–20)
CO2: 23 mmol/L (ref 22–32)
Calcium: 8.7 mg/dL — ABNORMAL LOW (ref 8.9–10.3)
Chloride: 104 mmol/L (ref 98–111)
Creatinine, Ser: 0.99 mg/dL (ref 0.61–1.24)
GFR, Estimated: >60 mL/min (ref >60)
Glucose, Bld: 141 mg/dL — ABNORMAL HIGH (ref 70–99)
Potassium: 4.3 mmol/L (ref 3.5–5.1)
Sodium: 136 mmol/L (ref 135–145)
Total Bilirubin: 0.9 mg/dL (ref 0.3–1.2)
Total Protein: 6.7 g/dL (ref 6.5–8.1)

## 2021-01-12 LAB — CBC WITH DIFFERENTIAL/PLATELET
Abs Immature Granulocytes: 0.04 10*3/uL (ref 0.00–0.07)
Basophils Absolute: 0.1 10*3/uL (ref 0.0–0.1)
Basophils Relative: 1 %
Eosinophils Absolute: 0.3 10*3/uL (ref 0.0–0.5)
Eosinophils Relative: 4 %
HCT: 38.3 % — ABNORMAL LOW (ref 39.0–52.0)
Hemoglobin: 12.7 g/dL — ABNORMAL LOW (ref 13.0–17.0)
Immature Granulocytes: 1 %
Lymphocytes Relative: 25 %
Lymphs Abs: 1.8 10*3/uL (ref 0.7–4.0)
MCH: 30.1 pg (ref 26.0–34.0)
MCHC: 33.2 g/dL (ref 30.0–36.0)
MCV: 90.8 fL (ref 80.0–100.0)
Monocytes Absolute: 0.5 10*3/uL (ref 0.1–1.0)
Monocytes Relative: 7 %
Neutro Abs: 4.5 10*3/uL (ref 1.7–7.7)
Neutrophils Relative %: 62 %
Platelets: 202 10*3/uL (ref 150–400)
RBC: 4.22 MIL/uL (ref 4.22–5.81)
RDW: 15.2 % (ref 11.5–15.5)
WBC: 7.2 10*3/uL (ref 4.0–10.5)
nRBC: 0 % (ref 0.0–0.2)

## 2021-01-12 LAB — ACETAMINOPHEN LEVEL: Acetaminophen (Tylenol), Serum: <10 ug/mL — ABNORMAL LOW (ref 10–30)

## 2021-01-12 LAB — ETHANOL: Alcohol, Ethyl (B): <10 mg/dL (ref <10)

## 2021-01-12 LAB — CBG MONITORING, ED: Glucose-Capillary: 147 mg/dL — ABNORMAL HIGH (ref 70–99)

## 2021-01-12 LAB — SALICYLATE LEVEL: Salicylate Lvl: <7 mg/dL — ABNORMAL LOW (ref 7.0–30.0)

## 2021-01-12 NOTE — Discharge Instructions (Addendum)
Please seek medical attention for any high fevers, chest pain, shortness of breath, change in behavior, persistent vomiting, bloody stool or any other new or concerning symptoms.  

## 2021-01-12 NOTE — ED Triage Notes (Signed)
Pt brought in via ems from home with altered mental status.  Last well known 1630 today.   Fsbs 161 per ems  iv in place.   On arrival, drowsy, slurred speech noted.  No headache.

## 2021-01-12 NOTE — ED Notes (Signed)
Pt awake and talking  family with pt  no distress noted.

## 2021-01-12 NOTE — ED Provider Notes (Signed)
[facility] Emergency Department Provider Note    ____________________________________________   I have reviewed the triage vital signs and the nursing notes.   HISTORY  Chief Complaint Altered Mental Status   History limited by and level 5 caveat due to: AMS  HPI Cote Sandor is a 50 y.o. male who presents to the emergency department today because of concern for altered mental status. Patient is unable to give significant history but states that since around 130 pm today he felt like he was going to fall out. He denies any pain.   Records reviewed. Per medical record review patient has a history of CAD, depression.   Past Medical History:  Diagnosis Date   Anxiety    Anxiety    Arthritis    cerv. stenosis, spondylosis, HNP- lower back , has been followed in pain clinic, has  had injection s in cerv. area   Blood dyscrasia    told that when he was young he was a" free bleeder"   CAD (coronary artery disease)    Cervical spondylosis without myelopathy 07/24/2014   Cervicogenic headache 07/24/2014   Chronic kidney disease    renal calculi- passed spontaneously   Depression    Diabetes mellitus without complication ([place])    Fatty liver    GERD (gastroesophageal reflux disease)    Headache(784.0)    Hyperlipidemia    Hypertension    Limb ischemia    right hand due to ulnar artery obstruction s/p injury   Mental disorder    MI, old    RLS (restless legs syndrome)    detected on sleep study   Shortness of breath    Ventricular fibrillation ([place]) 04/05/2016    Patient Active Problem List   Diagnosis Date Noted   Insomnia 08/22/2020   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   Lithium toxicity    Hyponatremia 01/24/2019   Substance induced mood disorder ([place]) 11/22/2018   Alcohol abuse with alcohol-induced mood disorder ([place]) 09/21/2018   Overdose 05/17/2018   Depression, major, recurrent, 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/30/2018    Personality disorder ([place]) 12/31/2017   MDD (major depressive disorder), recurrent episode, severe ([place]) 11/20/2017   Dysthymia 10/26/2017   Major depressive disorder, recurrent episode, moderate (HCC) 03/10/2017   Generalized anxiety disorder 02/05/2017   Chronic neck pain (Primary Area of Pain) (Left) 11/22/2016   Cervical facet syndrome (Left) 11/22/2016   Cervical radiculitis (Left) 11/22/2016   History of cervical spinal surgery 11/22/2016   Cervical spondylosis 11/22/2016   Chronic shoulder pain (Secondary Area of Pain) (Left) 11/22/2016   Arthropathy of shoulder (Left) 11/22/2016   Chronic low back pain [facility] Area of Pain) (Bilateral) (L>R) 11/22/2016   Lumbar facet syndrome (Bilateral) (L>R) 11/22/2016   Chronic pain syndrome 11/21/2016   Long term prescription benzodiazepine use 11/21/2016   Opiate use 11/21/2016   Type 2 diabetes mellitus without complication, without long-term current use of insulin ([place]) 10/19/2016   Chronic Suicidal ideation 09/15/2016   Coronary artery disease 09/12/2016   HTN (hypertension) 06/26/2016   GERD (gastroesophageal reflux disease) 06/26/2016   Alcohol use disorder, severe, 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[place]) 06/26/2016   Benzodiazepine abuse ([place]) 06/11/2016   Sedative, hypnotic or anxiolytic use disorder, severe, 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 Cervical three-four.   CARDIAC CATHETERIZATION N/A 01/01/2015   Procedure: Left Heart Cath and  Coronary Angiography;  Surgeon: [name], MD;  Location: [place] CV LAB;  Service: Cardiovascular;  Laterality: N/A;   CARDIAC CATHETERIZATION N/A 04/05/2016   Procedure: Left Heart Cath and Coronary Angiography;  Surgeon: Belva Crome, MD;  Location: [place] CV LAB;  Service: Cardiovascular;  Laterality: N/A;   CARDIAC CATHETERIZATION N/A 04/05/2016   Procedure: Coronary Stent Intervention;  Surgeon: Belva Crome, MD;  Location: [place] CV LAB;  Service: Cardiovascular;  Laterality: N/A;   CARDIAC CATHETERIZATION N/A 04/05/2016   Procedure: Intravascular Ultrasound/IVUS;  Surgeon: Belva Crome, MD;  Location: [place] CV LAB;  Service: Cardiovascular;  Laterality: N/A;   NASAL SINUS SURGERY     2005    Prior to Admission medications   Medication Sig Start Date End Date Taking? Authorizing Provider  aspirin 81 MG EC tablet Take 1 tablet (81 mg total) by mouth daily. For heart health Patient not taking: No sig reported 11/21/17   Lindell Spar I, NP  atorvastatin (LIPITOR) 20 MG tablet Take 20 mg by mouth daily. 12/16/19   [provider]  baclofen (LIORESAL) 10 MG tablet Take 10 mg by mouth 3 (three) times daily. 07/06/20   [provider]  carvedilol (COREG) 6.25 MG tablet Take 6.25 mg by mouth 2 (two) times daily with a meal. 10/25/18 08/21/20  [provider]  chlorproMAZINE (THORAZINE) 200 MG tablet Take 600 mg by mouth at bedtime. Patient not taking: Reported on 08/21/2020 11/17/19   [provider]  clopidogrel (PLAVIX) 75 MG tablet Take 75 mg by mouth daily. 12/16/19   [provider]  desvenlafaxine (PRISTIQ) 100 MG 24 hr tablet Take 100 mg by mouth daily. Patient not taking: Reported on 08/21/2020 05/24/20   [provider]  DULoxetine (CYMBALTA) 60 MG capsule Take 60 mg by mouth every morning. Patient not taking: Reported on 08/21/2020 07/06/20   [provider]  gabapentin (NEURONTIN) 800 MG tablet Take 800 mg  by mouth 3 (three) times daily. 07/06/20   [provider]  hydrOXYzine (VISTARIL) 100 MG capsule Take 100 mg by mouth at bedtime.    [provider]  INGREZZA 80 MG capsule Take 80 mg by mouth daily. 04/30/20   [provider]  lisinopril (ZESTRIL) 10 MG tablet Take 10 mg by mouth daily. 12/16/19   [provider]  metFORMIN (GLUCOPHAGE) 1000 MG tablet Take 1,000 mg by mouth 2 (two) times daily. 10/02/18   [provider]  mirtazapine (REMERON) 15 MG tablet Take 7.5 mg by mouth at bedtime. 07/06/20   [provider]  pantoprazole (PROTONIX) 40 MG tablet Take 1 tablet by mouth daily. 08/18/20   [provider]  QUEtiapine (SEROQUEL) 300 MG tablet Take 2 tablets (600 mg total) by mouth at bedtime. 08/24/20   Clapacs, Madie Reno, MD  REXULTI 1 MG TABS tablet Take 1 mg by mouth daily. 03/26/20   [provider]  rOPINIRole (REQUIP) 0.5 MG tablet Take 0.5 mg by mouth at bedtime. 07/07/20   [provider]  venlafaxine XR (EFFEXOR XR) 75 MG 24 hr capsule Take 1 capsule (75 mg total) by mouth daily. 08/24/20 08/24/21  Clapacs, Madie Reno, MD  Allergies Prednisone, Amoxicillin, Hydrocodone, Varenicline, Other, and Trazodone and nefazodone  Family History  Problem Relation Age of Onset   Prostate cancer Father    Hypertension Mother    Kidney Stones Mother    Anxiety disorder Mother    Depression Mother    COPD Sister    Hypertension Sister    Diabetes Sister    Depression Sister    Anxiety disorder Sister    Seizures Sister    ADD / ADHD Son    ADD / ADHD Daughter     Social History Social History   Tobacco Use   Smoking status: Every Day    Packs/day: 2.00    Years: 17.00    Pack years: 34.00    Types: Cigarettes, Cigars   Smokeless tobacco: Former   Tobacco comments:    occassional snuff   Vaping Use   Vaping Use: Never used  Substance Use Topics   Alcohol use: No    Alcohol/week: 0.0 standard drinks     Comment: last drinked in 3 months.    Drug use: No    Review of Systems Unable to obtain reliable ROS  ____________________________________________   PHYSICAL EXAM:  VITAL SIGNS: ED Triage Vitals  Enc Vitals Group     BP 01/12/21 1807 121/74     Pulse Rate 01/12/21 1807 95     Resp 01/12/21 1807 20     Temp --      Temp Source 01/12/21 1807 Oral     SpO2 01/12/21 1807 97 %     Weight 01/12/21 1804 200 lb (90.7 kg)     Height 01/12/21 1804 '6\' 2"'$  (1.88 m)     Head Circumference --      Peak Flow --      Pain Score 01/12/21 1803 0   Constitutional: somnolent. Eyes: Conjunctivae are normal.  ENT      Head: Normocephalic and atraumatic.      Nose: No congestion/rhinnorhea.      Mouth/Throat: Mucous membranes are moist.      Neck: No stridor. Hematological/Lymphatic/Immunilogical: No cervical lymphadenopathy. Cardiovascular: Normal rate, regular rhythm.  No murmurs, rubs, or gallops.  Respiratory: Normal respiratory effort without tachypnea nor retractions. Breath sounds are clear and equal bilaterally. No wheezes/rales/rhonchi. Gastrointestinal: Soft and non tender. No rebound. No guarding.  Genitourinary: Deferred Musculoskeletal: Normal range of motion in all extremities. No lower extremity edema. Neurologic:  Somnolent. Moving all extremities.  Skin:  Skin is warm, dry and intact. No rash noted. ____________________________________________    LABS (pertinent positives/negatives)  Acetaminophen, salicylate, ethanol below threshold CMP wnl except glu 141, ca 8.7 CBC wbc 7.2, hgb 12.7, plt 202  ____________________________________________   EKG  I, Nance Pear, attending physician, personally viewed and interpreted this EKG  EKG Time: 1806 Rate: 95 Rhythm: sinus rhythm Axis: right axis deviation Intervals: qtc 520 QRS: RBBB, LPFB ST changes: no st elevation Impression: abnormal ekg  ____________________________________________    RADIOLOGY  CT  head No acute abnormality  ____________________________________________   PROCEDURES  Procedures  ____________________________________________   INITIAL IMPRESSION / ASSESSMENT AND PLAN / ED COURSE  Pertinent labs & imaging results that were available during my care of the patient were reviewed by me and considered in my medical decision making (see chart for details).   Patient presented to the emergency department today because of concern for altered mental status. On initial exam patient was somnolent and could not give a good history. Broad work up was initiated for  AMS. Head CT without concerning abnormality. Blood work without concerning abnormality. While here in the emergency department patient did become more awake and alert. Requested discharge. Did ask patient about SI at this time and any unusual ingestions which he denied. Did discuss obtaining urine sample however he again requested discharge and at this time do not feel he meets IVC criteria. Encouraged patient to follow up with PCP.  ____________________________________________   FINAL CLINICAL IMPRESSION(S) / ED DIAGNOSES  Final diagnoses:  Altered mental status, unspecified altered mental status type     Note: This dictation was prepared with Dragon dictation. Any transcriptional errors that result from this process are unintentional     Nance Pear, MD 01/12/21 1942

## 2021-01-12 NOTE — ED Notes (Signed)
Pt up to bathroom.  Pt refuses to given urine sample. States i'm leaving.  Md aware and md in with pt and family.

## 2021-01-12 NOTE — ED Notes (Signed)
Pt brought in via em from home with altered mental status.  Pt sleepy.  Nsr on monitor    iv in place.  Pt denies h/a  no n/v/d  pt states started feeling weak at 1330 today.  Md in with pt on arrival

## 2021-02-24 ENCOUNTER — Encounter (HOSPITAL_COMMUNITY): Payer: Self-pay

## 2021-02-24 ENCOUNTER — Other Ambulatory Visit: Payer: Self-pay

## 2021-02-24 ENCOUNTER — Emergency Department (HOSPITAL_COMMUNITY)
Admission: EM | Admit: 2021-02-24 | Discharge: 2021-02-25 | Disposition: A | Discharge status: Home / Self Care | Payer: Medicare Other | Attending: Emergency Medicine | Admitting: Emergency Medicine

## 2021-02-24 DIAGNOSIS — I2510 Atherosclerotic heart disease of native coronary artery without angina pectoris: Secondary | ICD-10-CM | POA: Insufficient documentation

## 2021-02-24 DIAGNOSIS — E1122 Type 2 diabetes mellitus with diabetic chronic kidney disease: Secondary | ICD-10-CM | POA: Diagnosis not present

## 2021-02-24 DIAGNOSIS — Y900 Blood alcohol level of less than 20 mg/100 ml: Secondary | ICD-10-CM | POA: Diagnosis not present

## 2021-02-24 DIAGNOSIS — I129 Hypertensive chronic kidney disease with stage 1 through stage 4 chronic kidney disease, or unspecified chronic kidney disease: Secondary | ICD-10-CM | POA: Insufficient documentation

## 2021-02-24 DIAGNOSIS — J45909 Unspecified asthma, uncomplicated: Secondary | ICD-10-CM | POA: Insufficient documentation

## 2021-02-24 DIAGNOSIS — N189 Chronic kidney disease, unspecified: Secondary | ICD-10-CM | POA: Insufficient documentation

## 2021-02-24 DIAGNOSIS — R45851 Suicidal ideations: Secondary | ICD-10-CM

## 2021-02-24 DIAGNOSIS — Z7289 Other problems related to lifestyle: Secondary | ICD-10-CM | POA: Diagnosis not present

## 2021-02-24 DIAGNOSIS — Z79899 Other long term (current) drug therapy: Secondary | ICD-10-CM | POA: Diagnosis not present

## 2021-02-24 DIAGNOSIS — Z7984 Long term (current) use of oral hypoglycemic drugs: Secondary | ICD-10-CM | POA: Insufficient documentation

## 2021-02-24 DIAGNOSIS — Z7982 Long term (current) use of aspirin: Secondary | ICD-10-CM | POA: Diagnosis not present

## 2021-02-24 DIAGNOSIS — Z765 Malingerer [conscious simulation]: Secondary | ICD-10-CM

## 2021-02-24 DIAGNOSIS — F17210 Nicotine dependence, cigarettes, uncomplicated: Secondary | ICD-10-CM | POA: Diagnosis not present

## 2021-02-24 DIAGNOSIS — F609 Personality disorder, unspecified: Secondary | ICD-10-CM | POA: Diagnosis present

## 2021-02-24 LAB — ACETAMINOPHEN LEVEL: Acetaminophen (Tylenol), Serum: <10 ug/mL — ABNORMAL LOW (ref 10–30)

## 2021-02-24 LAB — CBC
HCT: 38.7 % — ABNORMAL LOW (ref 39.0–52.0)
Hemoglobin: 12 g/dL — ABNORMAL LOW (ref 13.0–17.0)
MCH: 29.9 pg (ref 26.0–34.0)
MCHC: 31 g/dL (ref 30.0–36.0)
MCV: 96.5 fL (ref 80.0–100.0)
Platelets: 175 10*3/uL (ref 150–400)
RBC: 4.01 MIL/uL — ABNORMAL LOW (ref 4.22–5.81)
RDW: 15.8 % — ABNORMAL HIGH (ref 11.5–15.5)
WBC: 5.3 10*3/uL (ref 4.0–10.5)
nRBC: 0 % (ref 0.0–0.2)

## 2021-02-24 LAB — COMPREHENSIVE METABOLIC PANEL
ALT: 11 U/L (ref 0–44)
AST: 14 U/L — ABNORMAL LOW (ref 15–41)
Albumin: 3.3 g/dL — ABNORMAL LOW (ref 3.5–5.0)
Alkaline Phosphatase: 54 U/L (ref 38–126)
Anion gap: 9 (ref 5–15)
BUN: 7 mg/dL (ref 6–20)
CO2: 22 mmol/L (ref 22–32)
Calcium: 8.8 mg/dL — ABNORMAL LOW (ref 8.9–10.3)
Chloride: 107 mmol/L (ref 98–111)
Creatinine, Ser: 0.77 mg/dL (ref 0.61–1.24)
GFR, Estimated: >60 mL/min (ref >60)
Glucose, Bld: 75 mg/dL (ref 70–99)
Potassium: 3.8 mmol/L (ref 3.5–5.1)
Sodium: 138 mmol/L (ref 135–145)
Total Bilirubin: 0.4 mg/dL (ref 0.3–1.2)
Total Protein: 5.6 g/dL — ABNORMAL LOW (ref 6.5–8.1)

## 2021-02-24 LAB — SALICYLATE LEVEL: Salicylate Lvl: <7 mg/dL — ABNORMAL LOW (ref 7.0–30.0)

## 2021-02-24 LAB — ETHANOL: Alcohol, Ethyl (B): <10 mg/dL (ref <10)

## 2021-02-24 MED ORDER — ONDANSETRON HCL 4 MG PO TABS: 4.0000 mg | ORAL_TABLET | Freq: Three times a day (TID) | ORAL | Status: DC | PRN

## 2021-02-24 MED ORDER — HYDROXYZINE PAMOATE 50 MG PO CAPS: 100.0000 mg | ORAL_CAPSULE | Freq: Every day | ORAL | Status: DC

## 2021-02-24 MED ORDER — ACETAMINOPHEN 325 MG PO TABS
650.0000 mg | ORAL_TABLET | ORAL | Status: DC | PRN
  Administered 2021-02-25: 650 mg via ORAL
  Filled 2021-02-24: qty 2

## 2021-02-24 MED ORDER — ALUM & MAG HYDROXIDE-SIMETH 200-200-20 MG/5ML PO SUSP: 30.0000 mL | Freq: Four times a day (QID) | ORAL | Status: DC | PRN

## 2021-02-24 MED ORDER — HYDROXYZINE HCL 25 MG PO TABS
100.0000 mg | ORAL_TABLET | Freq: Every day | ORAL | Status: DC
  Filled 2021-02-24: qty 4

## 2021-02-24 MED ORDER — NICOTINE 21 MG/24HR TD PT24
21.0000 mg | MEDICATED_PATCH | Freq: Every day | TRANSDERMAL | Status: DC
  Administered 2021-02-25: 21 mg via TRANSDERMAL
  Filled 2021-02-24: qty 1

## 2021-02-24 NOTE — ED Notes (Signed)
TTS complete. Refused taking vistaril.

## 2021-02-24 NOTE — ED Triage Notes (Signed)
Pt arrives via [facility] from a hotel c/o suicidal ideation. Uncooperative with EMS.

## 2021-02-24 NOTE — ED Provider Notes (Signed)
[facility] EMERGENCY DEPARTMENT Provider Note   CSN: [id number] Arrival date & time: 02/24/21  2114     History Chief Complaint  Patient presents with   Suicidal    Michael Schmitt is a 50 y.o. male.  HPI 50 year old male presents with suicidal thoughts.  He states that this has been ongoing for several weeks but worse today.  He cannot tell me what would have caused him to feel worse today.  He states he has chronic neck pain that is unchanged but no other new symptoms or pains.  No fevers.  He denies any plan of how to commit suicide though states he did try several years ago.  He is currently asking for something to calm him down.  He states typically he is given 2 mg of Ativan.  Past Medical History:  Diagnosis Date   Anxiety    Anxiety    Arthritis    cerv. stenosis, spondylosis, HNP- lower back , has been followed in pain clinic, has  had injection s in cerv. area   Blood dyscrasia    told that when he was young he was a" free bleeder"   CAD (coronary artery disease)    Cervical spondylosis without myelopathy 07/24/2014   Cervicogenic headache 07/24/2014   Chronic kidney disease    renal calculi- passed spontaneously   Depression    Diabetes mellitus without complication ([place])    Fatty liver    GERD (gastroesophageal reflux disease)    Headache(784.0)    Hyperlipidemia    Hypertension    Limb ischemia    right hand due to ulnar artery obstruction s/p injury   Mental disorder    MI, old    RLS (restless legs syndrome)    detected on sleep study   Shortness of breath    Ventricular fibrillation ([place]) 04/05/2016    Patient Active Problem List   Diagnosis Date Noted   Insomnia 08/22/2020   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   Lithium toxicity    Hyponatremia 01/24/2019   Substance induced mood disorder ([place]) 11/22/2018   Alcohol abuse with alcohol-induced mood disorder ([place]) 09/21/2018   Overdose 05/17/2018   Depression, major, recurrent,  severe with psychosis ([place]) 03/30/2018   Personality disorder ([place]) 12/31/2017   MDD (major depressive disorder), recurrent episode, severe ([place]) 11/20/2017   Dysthymia 10/26/2017   Major depressive disorder, recurrent episode, moderate (HCC) 03/10/2017   Generalized anxiety disorder 02/05/2017   Chronic neck pain (Primary Area of Pain) (Left) 11/22/2016   Cervical facet syndrome (Left) 11/22/2016   Cervical radiculitis (Left) 11/22/2016   History of cervical spinal surgery 11/22/2016   Cervical spondylosis 11/22/2016   Chronic shoulder pain (Secondary Area of Pain) (Left) 11/22/2016   Arthropathy of shoulder (Left) 11/22/2016   Chronic low back pain [facility] Area of Pain) (Bilateral) (L>R) 11/22/2016   Lumbar facet syndrome (Bilateral) (L>R) 11/22/2016   Chronic pain syndrome 11/21/2016   Long term prescription benzodiazepine use 11/21/2016   Opiate use 11/21/2016   Type 2 diabetes mellitus without complication, without long-term current use of insulin ([place]) 10/19/2016   Chronic Suicidal ideation 09/15/2016   Coronary artery disease 09/12/2016   HTN (hypertension) 06/26/2016   GERD (gastroesophageal reflux disease) 06/26/2016   Alcohol use disorder, severe, 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[place]) 06/26/2016   Benzodiazepine abuse ([place]) 06/11/2016   Sedative, hypnotic or anxiolytic use disorder, severe, 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 Cervical three-four.   CARDIAC CATHETERIZATION N/A  01/01/2015   Procedure: Left Heart Cath and Coronary Angiography;  Surgeon: [name], MD;  Location: [place] CV LAB;  Service: Cardiovascular;  Laterality: N/A;   CARDIAC CATHETERIZATION N/A 04/05/2016   Procedure: Left Heart Cath and Coronary Angiography;  Surgeon: Belva Crome, MD;  Location: [place] CV LAB;  Service: Cardiovascular;  Laterality: N/A;   CARDIAC CATHETERIZATION N/A 04/05/2016   Procedure: Coronary Stent Intervention;  Surgeon: Belva Crome, MD;  Location: [place] CV LAB;  Service: Cardiovascular;  Laterality: N/A;   CARDIAC CATHETERIZATION N/A 04/05/2016   Procedure: Intravascular Ultrasound/IVUS;  Surgeon: Belva Crome, MD;  Location: [place] CV LAB;  Service: Cardiovascular;  Laterality: N/A;   NASAL SINUS SURGERY     2005       Family History  Problem Relation Age of Onset   Prostate cancer Father    Hypertension Mother    Kidney Stones Mother    Anxiety disorder Mother    Depression Mother    COPD Sister    Hypertension Sister    Diabetes Sister    Depression Sister    Anxiety disorder Sister    Seizures Sister    ADD / ADHD Son    ADD / ADHD Daughter     Social History   Tobacco Use   Smoking status: Every Day    Packs/day: 2.00    Years: 17.00    Pack years: 34.00    Types: Cigarettes, Cigars   Smokeless tobacco: Former   Tobacco comments:    occassional snuff   Vaping Use   Vaping Use: Never used  Substance Use Topics   Alcohol use: No    Alcohol/week: 0.0 standard drinks    Comment: last drinked in 3 months.    Drug use: No    Home Medications Prior to Admission medications   Medication Sig Start Date End Date Taking? Authorizing Provider  aspirin 81 MG EC tablet Take 1 tablet (81 mg total) by mouth daily. For heart health Patient not taking: No sig reported 11/21/17   Lindell Spar I, NP  atorvastatin (LIPITOR) 20 MG tablet Take 20 mg by mouth daily. 12/16/19   [provider]  baclofen (LIORESAL) 10 MG  tablet Take 10 mg by mouth 3 (three) times daily. 07/06/20   [provider]  carvedilol (COREG) 6.25 MG tablet Take 6.25 mg by mouth 2 (two) times daily with a meal. 10/25/18 08/21/20  [provider]  chlorproMAZINE (THORAZINE) 200 MG tablet Take 600 mg by mouth at bedtime. Patient not taking: Reported on 08/21/2020 11/17/19   [provider]  clopidogrel (PLAVIX) 75 MG tablet Take 75 mg by mouth daily. 12/16/19   [provider]  desvenlafaxine (PRISTIQ) 100 MG 24 hr tablet Take 100 mg by mouth daily. Patient not taking: Reported on 08/21/2020 05/24/20   [provider]  DULoxetine (CYMBALTA) 60 MG capsule Take 60 mg by mouth every morning. Patient not taking: Reported on 08/21/2020 07/06/20   [provider]  gabapentin (NEURONTIN) 800 MG tablet Take 800 mg by mouth 3 (three) times daily.  07/06/20   [provider]  hydrOXYzine (VISTARIL) 100 MG capsule Take 100 mg by mouth at bedtime.    [provider]  INGREZZA 80 MG capsule Take 80 mg by mouth daily. 04/30/20   [provider]  lisinopril (ZESTRIL) 10 MG tablet Take 10 mg by mouth daily. 12/16/19   [provider]  metFORMIN (GLUCOPHAGE) 1000 MG tablet Take 1,000 mg by mouth 2 (two) times daily. 10/02/18   [provider]  mirtazapine (REMERON) 15 MG tablet Take 7.5 mg by mouth at bedtime. 07/06/20   [provider]  pantoprazole (PROTONIX) 40 MG tablet Take 1 tablet by mouth daily. 08/18/20   [provider]  QUEtiapine (SEROQUEL) 300 MG tablet Take 2 tablets (600 mg total) by mouth at bedtime. 08/24/20   Clapacs, Madie Reno, MD  REXULTI 1 MG TABS tablet Take 1 mg by mouth daily. 03/26/20   [provider]  rOPINIRole (REQUIP) 0.5 MG tablet Take 0.5 mg by mouth at bedtime. 07/07/20   [provider]  venlafaxine XR (EFFEXOR XR) 75 MG 24 hr capsule Take 1 capsule (75 mg total) by mouth daily. 08/24/20 08/24/21  Clapacs, Madie Reno, MD     Allergies    Prednisone, Amoxicillin, Hydrocodone, Varenicline, Other, and Trazodone and nefazodone  Review of Systems   Review of Systems  Constitutional:  Negative for fever.  Respiratory:  Negative for cough.   Cardiovascular:  Negative for chest pain.  Musculoskeletal:  Positive for neck pain.  Neurological:  Negative for headaches.  All other systems reviewed and are negative.  Physical Exam Updated Vital Signs BP 129/76   Pulse 95   Temp 98.2 F (36.8 C) (Oral)   Resp 18   SpO2 100%   Physical Exam Vitals and nursing note reviewed.  Constitutional:      Appearance: He is well-developed.  HENT:     Head: Normocephalic and atraumatic.     Right Ear: External ear normal.     Left Ear: External ear normal.     Nose: Nose normal.  Eyes:     General:        Right eye: No discharge.        Left eye: No discharge.  Cardiovascular:     Rate and Rhythm: Normal rate and regular rhythm.     Heart sounds: Normal heart sounds.  Pulmonary:     Effort: Pulmonary effort is normal.     Breath sounds: Normal breath sounds.  Abdominal:     General: There is no distension.     Palpations: Abdomen is soft.     Tenderness: There is no abdominal tenderness.  Musculoskeletal:     Cervical back: Neck supple.  Skin:    General: Skin is warm and dry.  Neurological:     Mental Status: He is alert.  Psychiatric:        Mood and Affect: Mood is not anxious.        Behavior: Behavior is not agitated.        Thought Content: Thought content includes suicidal ideation. Thought content does not include suicidal plan.     Comments: Patient is laying on the stretcher calmly with no acute agitation or distress    ED Results / Procedures / Treatments   Labs (all labs ordered are listed, but only abnormal results are displayed) Labs Reviewed  COMPREHENSIVE METABOLIC PANEL - Abnormal; Notable for the following components:      Result Value   Calcium  8.8 (*)    Total Protein 5.6 (*)     Albumin 3.3 (*)    AST 14 (*)    All other components within normal limits  CBC - Abnormal; Notable for the following components:   RBC 4.01 (*)    Hemoglobin 12.0 (*)    HCT 38.7 (*)    RDW 15.8 (*)    All other components within normal limits  RESP PANEL BY RT-PCR (FLU A&B, COVID) ARPGX2  ETHANOL  SALICYLATE LEVEL  ACETAMINOPHEN LEVEL  RAPID URINE DRUG SCREEN, HOSP PERFORMED    EKG None  Radiology No results found.  Procedures Procedures   Medications Ordered in ED Medications  acetaminophen (TYLENOL) tablet 650 mg (has no administration in time range)  alum & mag hydroxide-simeth (MAALOX/MYLANTA) 200-200-20 MG/5ML suspension 30 mL (has no administration in time range)  ondansetron (ZOFRAN) tablet 4 mg (has no administration in time range)  nicotine (NICODERM CQ - dosed in mg/24 hours) patch 21 mg (has no administration in time range)  hydrOXYzine (ATARAX) tablet 100 mg (has no administration in time range)    ED Course  I have reviewed the triage vital signs and the nursing notes.  Pertinent labs & imaging results that were available during my care of the patient were reviewed by me and considered in my medical decision making (see chart for details).    MDM Rules/Calculators/A&P                           Patient with recurrent suicidal ideation.  No plan at this time.  He is also complaining of needing something to "calm me down".  However he is not agitated or aggressive.  While he sometimes does act out in the past, I think he just wants benzodiazepines and I do not think this is indicated at this time.  Home meds still need to have med rec.  Otherwise he will get TTS consult.  Initial screening lab work is benign. Final Clinical Impression(s) / ED Diagnoses Final diagnoses:  Suicidal ideation    Rx / DC Orders ED Discharge Orders     None        Sherwood Gambler, MD 02/24/21 2336

## 2021-02-24 NOTE — ED Notes (Signed)
Pt yelling out, demanding to see a MD now. Explained to pt that the EDP will be to see him just as soon as they can. Security called to bedside.

## 2021-02-24 NOTE — BH Assessment (Signed)
Clinician messaged Merri Ray, RN: "Hey. It's Trey with TTS. Is the pt able to be assessed. If so is he under IVC?"   Clinician awaiting response.     Vertell Novak, [place], [facility], [facility] Triage Specialist [phone]

## 2021-02-24 NOTE — ED Notes (Signed)
Pt changed into burgundy scrubs, belongings secured, security wanded pt at this time.

## 2021-02-25 DIAGNOSIS — R45851 Suicidal ideations: Secondary | ICD-10-CM | POA: Diagnosis not present

## 2021-02-25 DIAGNOSIS — F609 Personality disorder, unspecified: Secondary | ICD-10-CM | POA: Diagnosis not present

## 2021-02-25 DIAGNOSIS — Z765 Malingerer [conscious simulation]: Secondary | ICD-10-CM

## 2021-02-25 LAB — RAPID URINE DRUG SCREEN, HOSP PERFORMED
Amphetamines: NOT DETECTED
Barbiturates: NOT DETECTED
Benzodiazepines: NOT DETECTED
Cocaine: NOT DETECTED
Opiates: NOT DETECTED
Tetrahydrocannabinol: NOT DETECTED

## 2021-02-25 LAB — LIPID PANEL
Cholesterol: 106 mg/dL (ref 0–200)
HDL: 28 mg/dL — ABNORMAL LOW (ref >40)
LDL Cholesterol: 35 mg/dL (ref 0–99)
Total CHOL/HDL Ratio: 3.8 RATIO
Triglycerides: 213 mg/dL — ABNORMAL HIGH (ref <150)
VLDL: 43 mg/dL — ABNORMAL HIGH (ref 0–40)

## 2021-02-25 LAB — LITHIUM LEVEL: Lithium Lvl: 0.68 mmol/L (ref 0.60–1.20)

## 2021-02-25 MED ORDER — VALBENAZINE TOSYLATE 40 MG PO CAPS
80.0000 mg | ORAL_CAPSULE | Freq: Every day | ORAL | Status: DC
  Filled 2021-02-25 (×4): qty 2

## 2021-02-25 MED ORDER — DIPHENHYDRAMINE HCL 25 MG PO CAPS
50.0000 mg | ORAL_CAPSULE | Freq: Once | ORAL | Status: AC
  Administered 2021-02-25: 50 mg via ORAL
  Filled 2021-02-25: qty 2

## 2021-02-25 NOTE — BH Assessment (Signed)
Comprehensive Clinical Assessment (CCA) Note  02/25/2021 Michael Schmitt [id number]  Disposition: Lindon Romp, PMHNP recommends inpatient treatment. Per Larose Kells, RN no beds available at [facility]. Disposition CSW to seek placement. Disposition discussed with Merri Ray, RN.   [place] [facility] from 02/24/2021 in [place] [facility] from 01/12/2021 in [place] [facility] from 10/18/2020 in [place] High Risk No Risk High Risk      The patient demonstrates the following risk factors for suicide: Chronic risk factors for suicide include: psychiatric disorder of Major Depressive Disorder, recurrent, severe without psychotic features and previous suicide attempts Pt reports, within the last three years he's tired shooting himself, hanging himself and slitting his throat . Acute risk factors for suicide include:  Pt is actively suicidal . Protective factors for this patient include: positive social support and positive therapeutic relationship. Considering these factors, the overall suicide risk at this point appears to be high. Patient is not appropriate for outpatient follow up.  Michael Schmitt is a 50 year old male who presents voluntary and unaccompanied to [place]. Clinician asked the pt, "what brought you to the hospital?" Pt reports, "suicidal thoughts for a couple of weeks." Pt reports, he doesn't know what's triggering his suicidal thoughts; he just wants to do whatever it takes to kill himself. Pt denies, HI, AVH, self-injurious behaviors and access to weapons.   Pt denies, substance use. Pt's BAL was <10 at 2254. Pt's UDS is negative. Pt reports, two previous inpatient admissions at [facility] and [facility] after suicide attempts.   Pt presents quiet, awake in scrubs with normal speech. Pt's mood, depressed, hopeless. Pt's affect was depressed. Pt's insight was lacking.  Pt's judgement was poor. Pt reports, he's unable to contract for safety if discharged.   Diagnosis: Major Depressive Disorder, recurrent, severe without psychotic features.   *Pt consented for clinician to speak to his sister Michael Schmitt, [phone]) to gather additional information however clinician left a HIPPA compliant voice message with call back number.*  Chief Complaint:  Chief Complaint  Patient presents with   Suicidal   Visit Diagnosis:     CCA Screening, Triage and Referral (STR)  Patient Reported Information How did you hear about [place]? Other (Comment) (Ambulance.)  What Is the Reason for Your Visit/Call Today? Per EDP note: "50 year old male presents with suicidal thoughts.  He states that this has been ongoing for several weeks but worse today. He cannot tell me what would have caused him to feel worse today. He states he has chronic neck pain that is unchanged but no other new symptoms or pains. No fevers. He denies any plan of how to commit suicide though states he did try several years ago. He is currently asking for something to calm him down. He states typically he is given 2 mg of Ativan."  How Long Has This Been Causing You Problems? 1 wk - 1 month  What Do You Feel Would Help You the Most Today? Treatment for Depression or other mood problem   Have You Recently Had Any Thoughts About Hurting Yourself? Yes  Are You Planning to Commit Suicide/Harm Yourself At This time? Yes   Have you Recently Had Thoughts About Hurting Someone [name]? No  Are You Planning to Harm Someone at This Time? No  Explanation: No data recorded  Have You Used Any Alcohol or Drugs in the Past 24 Hours? No (Pt denies.)  How Long Ago Did You Use Drugs or Alcohol? No data recorded What Did You Use and How Much? No data recorded  Do You Currently Have a Therapist/Psychiatrist? Yes  Name of Therapist/Psychiatrist: Pt's psychiatrist and therapist are through [facility].   Have You Been  Recently Discharged From Any Office Practice or Programs? No  Explanation of Discharge From Practice/Program: No data recorded    CCA Screening Triage Referral Assessment Type of Contact: Tele-Assessment  Telemedicine Service Delivery: Telemedicine service delivery: This service was provided via telemedicine using a 2-way, interactive audio and video technology  Is this Initial or Reassessment? Initial Assessment  Date Telepsych consult ordered in [facility]:  02/24/21  Time Telepsych consult ordered in [facility]:  2210  Location of Assessment: [facility]  Provider Location: [facility]   Collateral Involvement: Pt consented for clinician to speak to his sister Barnes Florek, [phone]) to gather additional information however clinician left a HIPPA compliant voice message with call back number.   Does Patient Have a Stage manager Guardian? No data recorded Name and Contact of Legal Guardian: No data recorded If Minor and Not Living with Parent(s), Who has Custody? No data recorded Is CPS involved or ever been involved? Never  Is APS involved or ever been involved? Never   Patient Determined To Be At Risk for Harm To Self or Others Based on Review of Patient Reported Information or Presenting Complaint? Yes, for Self-Harm  Method: No data recorded Availability of Means: No data recorded Intent: No data recorded Notification Required: No data recorded Additional Information for Danger to Others Potential: No data recorded Additional Comments for Danger to Others Potential: No data recorded Are There Guns or Other Weapons in Your Home? No data recorded Types of Guns/Weapons: No data recorded Are These Weapons Safely Secured?                            No data recorded Who Could Verify You Are Able To Have These Secured: No data recorded Do You Have any Outstanding Charges, Pending Court Dates, Parole/Probation? No data recorded Contacted To Inform of Risk of Harm  To Self or Others: No data recorded   Does Patient Present under Involuntary Commitment? No  IVC Papers Initial File Date: No data recorded  [place] of Residence: Other (Comment) (Per pt [place] but has been in [place] (for the pt week).)   Patient Currently Receiving the Following Services: Individual Therapy; Medication Management   Determination of Need: Emergent (2 hours)   Options For Referral: Inpatient Hospitalization; [facility]; Medication Management; Outpatient Therapy     CCA Biopsychosocial Patient Reported Schizophrenia/Schizoaffective Diagnosis in Past: No data recorded  Strengths: Family supports.   Mental Health Symptoms Depression:   Change in energy/activity; Difficulty Concentrating; Hopelessness; Worthlessness; Irritability; Fatigue   Duration of Depressive symptoms:    Mania:   Irritability   Anxiety:    Difficulty concentrating; Irritability; Restlessness; Tension; Worrying   Psychosis:   None   Duration of Psychotic symptoms:    Trauma:   None   Obsessions:   None   Compulsions:   None   Inattention:   Forgetful; Loses things; Disorganized   Hyperactivity/Impulsivity:   Feeling of restlessness; Fidgets with hands/feet   Oppositional/Defiant Behaviors:   None   Emotional Irregularity:   Recurrent suicidal behaviors/gestures/threats   Other Mood/Personality Symptoms:  No data recorded   Mental Status Exam Appearance and self-care  Stature:  Average   Weight:   Average weight   Clothing:   -- (Pt in scrubs.)   Grooming:   Normal   Cosmetic use:   None   Posture/gait:   Normal   Motor activity:   Not Remarkable   Sensorium  Attention:   Normal   Concentration:   Normal   Orientation:   X5   Recall/memory:   Normal   Affect and Mood  Affect:   Depressed   Mood:   Depressed; Hopeless   Relating  Eye contact:   Normal   Facial expression:   Responsive   Attitude toward  examiner:   Cooperative   Thought and Language  Speech flow:  Normal   Thought content:   Appropriate to Mood and Circumstances   Preoccupation:   Suicide   Hallucinations:   None   Organization:  No data recorded  [employer] of Knowledge:   Average   Intelligence:   Average   Abstraction:   Normal   Judgement:   Poor   Reality Testing:   Realistic   Insight:   Lacking   Decision Making:   Impulsive   Social Functioning  Social Maturity:   Impulsive   Social Judgement:   Heedless   Stress  Stressors:   Other (Comment) (Per pt, "bills.")   Coping Ability:   Overwhelmed   Skill Deficits:   Decision making   Supports:   Family     Religion: Religion/Spirituality Are You A Religious Person?: Yes What is Your Religious Affiliation?: International aid/development worker: Leisure / Recreation Do You Have Hobbies?: No  Exercise/Diet: Exercise/Diet Do You Exercise?: Yes What Type of Exercise Do You Do?: Run/Walk How Many Times a Week Do You Exercise?: Daily Do You Follow a Special Diet?: No Do You Have Any Trouble Sleeping?: No   CCA Employment/Education Employment/Work Situation: Employment / Work Situation Employment Situation: On disability Why is Patient on Disability: Per pt, "neck pain and depression." How Long has Patient Been on Disability: Since 2015. Has Patient ever Been in the [place]?: No  Education: Education Is Patient Currently Attending School?: No Last Grade Completed: 11 Did You Attend College?: No   CCA Family/Childhood History Family and Relationship History: Family history Marital status: Single Does patient have children?: Yes How many children?: 3 How is patient's relationship with their children?: Pt reports, two of his children live with him and his sister.  Childhood History:  Childhood History Did patient suffer any verbal/emotional/physical/sexual abuse as a child?: No (Pt  denies.) Did patient suffer from severe childhood neglect?: No (Pt denies.) Has patient ever been sexually abused/assaulted/raped as an adolescent or adult?: No (Pt denies.) Was the patient ever a victim of a crime or a disaster?: No (Pt denies.) Witnessed domestic violence?: No (Pt denies.) Has patient been affected by domestic violence as an adult?:  (NA)  Child/Adolescent Assessment:     CCA Substance Use Alcohol/Drug Use: Alcohol / Drug Use Pain Medications: See MAR Prescriptions: See MAR Over the Counter: See MAR History of alcohol / drug use?: No history of alcohol / drug abuse (Pt denies substance use.)    ASAM's:  Six Dimensions of Multidimensional Assessment  Dimension 1:  Acute Intoxication and/or Withdrawal Potential:      Dimension 2:  Biomedical Conditions and Complications:      Dimension 3:  Emotional, Behavioral, or Cognitive Conditions and Complications:     Dimension 4:  Readiness to Change:     Dimension  5:  Relapse, Continued use, or Continued Problem Potential:     Dimension 6:  Recovery/Living Environment:     ASAM Severity Score:    ASAM Recommended Level of Treatment:     Substance use Disorder (SUD)    Recommendations for Services/Supports/Treatments: Recommendations for Services/Supports/Treatments Recommendations For Services/Supports/Treatments: Inpatient Hospitalization  Discharge Disposition:    DSM5 Diagnoses: Patient Active Problem List   Diagnosis Date Noted   Insomnia 08/22/2020   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   Lithium toxicity    Hyponatremia 01/24/2019   Substance induced mood disorder ([employer]) 11/22/2018   Alcohol abuse with alcohol-induced mood disorder ([place]) 09/21/2018   Overdose 05/17/2018   Depression, major, recurrent, severe with psychosis ([place]) 03/30/2018   Personality disorder ([place]) 12/31/2017   MDD (major depressive disorder), recurrent episode, severe ([place]) 11/20/2017   Dysthymia 10/26/2017   Major  depressive disorder, recurrent episode, moderate (HCC) 03/10/2017   Generalized anxiety disorder 02/05/2017   Chronic neck pain (Primary Area of Pain) (Left) 11/22/2016   Cervical facet syndrome (Left) 11/22/2016   Cervical radiculitis (Left) 11/22/2016   History of cervical spinal surgery 11/22/2016   Cervical spondylosis 11/22/2016   Chronic shoulder pain (Secondary Area of Pain) (Left) 11/22/2016   Arthropathy of shoulder (Left) 11/22/2016   Chronic low back pain [facility] Area of Pain) (Bilateral) (L>R) 11/22/2016   Lumbar facet syndrome (Bilateral) (L>R) 11/22/2016   Chronic pain syndrome 11/21/2016   Long term prescription benzodiazepine use 11/21/2016   Opiate use 11/21/2016   Type 2 diabetes mellitus without complication, without long-term current use of insulin ([place]) 10/19/2016   Chronic Suicidal ideation 09/15/2016   Coronary artery disease 09/12/2016   HTN (hypertension) 06/26/2016   GERD (gastroesophageal reflux disease) 06/26/2016   Alcohol use disorder, severe, 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[place]) 06/26/2016   Benzodiazepine abuse ([place]) 06/11/2016   Sedative, hypnotic or anxiolytic use disorder, severe, 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/12/2016   Ventricular fibrillation ([place]) 04/05/2016   Combined hyperlipidemia 01/05/2016   Cervical spondylosis without myelopathy 07/24/2014   Tobacco use disorder 10/20/2010   Asthma 10/20/2010     Referrals to Alternative Service(s): Referred to Alternative Service(s):   Place:   Date:   Time:    Referred to Alternative Service(s):   Place:   Date:   Time:    Referred to Alternative Service(s):   Place:   Date:   Time:    Referred to Alternative Service(s):   Place:   Date:   Time:     Vertell Novak, [facility] Comprehensive Clinical Assessment (CCA) Screening, Triage and Referral Note  02/25/2021 Michael Schmitt [id number]  Chief Complaint:  Chief Complaint  Patient presents with   Suicidal   Visit Diagnosis:   Patient Reported  Information How did you hear about [place]? Other (Comment) (Ambulance.)  What Is the Reason for Your Visit/Call Today? Per EDP note: "50 year old male presents with suicidal thoughts.  He states that this has been ongoing for several weeks but worse today. He cannot tell me what would have caused him to feel worse today. He states he has chronic neck pain that is unchanged but no other new symptoms or pains. No fevers. He denies any plan of how to commit suicide though states he did try several years ago. He is currently asking for something to calm him down. He states typically he is given 2 mg of Ativan."  How Long Has This Been Causing You Problems? 1 wk - 1 month  What Do You Feel Would Help  You the Most Today? Treatment for Depression or other mood problem   Have You Recently Had Any Thoughts About Hurting Yourself? Yes  Are You Planning to Commit Suicide/Harm Yourself At This time? Yes   Have you Recently Had Thoughts About Hurting Someone [name]? No  Are You Planning to Harm Someone at This Time? No  Explanation: No data recorded  Have You Used Any Alcohol or Drugs in the Past 24 Hours? No (Pt denies.)  How Long Ago Did You Use Drugs or Alcohol? No data recorded What Did You Use and How Much? No data recorded  Do You Currently Have a Therapist/Psychiatrist? Yes  Name of Therapist/Psychiatrist: Pt's psychiatrist and therapist are through [facility].   Have You Been Recently Discharged From Any Office Practice or Programs? No  Explanation of Discharge From Practice/Program: No data recorded   CCA Screening Triage Referral Assessment Type of Contact: Tele-Assessment  Telemedicine Service Delivery: Telemedicine service delivery: This service was provided via telemedicine using a 2-way, interactive audio and video technology  Is this Initial or Reassessment? Initial Assessment  Date Telepsych consult ordered in [facility]:  02/24/21  Time Telepsych consult ordered in [facility]:  2210  Location  of Assessment: [facility]  Provider Location: [facility]   Collateral Involvement: Pt consented for clinician to speak to his sister Davidson Palmieri, [phone]) to gather additional information however clinician left a HIPPA compliant voice message with call back number.   Does Patient Have a Stage manager Guardian? No data recorded Name and Contact of Legal Guardian: No data recorded If Minor and Not Living with Parent(s), Who has Custody? No data recorded Is CPS involved or ever been involved? Never  Is APS involved or ever been involved? Never   Patient Determined To Be At Risk for Harm To Self or Others Based on Review of Patient Reported Information or Presenting Complaint? Yes, for Self-Harm  Method: No data recorded Availability of Means: No data recorded Intent: No data recorded Notification Required: No data recorded Additional Information for Danger to Others Potential: No data recorded Additional Comments for Danger to Others Potential: No data recorded Are There Guns or Other Weapons in Your Home? No data recorded Types of Guns/Weapons: No data recorded Are These Weapons Safely Secured?                            No data recorded Who Could Verify You Are Able To Have These Secured: No data recorded Do You Have any Outstanding Charges, Pending Court Dates, Parole/Probation? No data recorded Contacted To Inform of Risk of Harm To Self or Others: No data recorded  Does Patient Present under Involuntary Commitment? No  IVC Papers Initial File Date: No data recorded  [place] of Residence: Other (Comment) (Per pt [place] but has been in [place] (for the pt week).)   Patient Currently Receiving the Following Services: Individual Therapy; Medication Management   Determination of Need: Emergent (2 hours)   Options For Referral: Inpatient Hospitalization; [facility]; Medication Management; Outpatient Therapy   Discharge  Disposition:     Vertell Novak, [place], [place], [facility], [facility] Triage Specialist [phone]

## 2021-02-25 NOTE — ED Notes (Signed)
Patient noted to have random outbursts of yelling. Upset at this time because MD will not prescribe Ativan/Xanax. Repeatedly asking when he will be "getting out of here."

## 2021-02-25 NOTE — Discharge Instructions (Signed)
Follow-up as recommended by behavioral health. Return for new concerns.

## 2021-02-25 NOTE — Consult Note (Signed)
Telepsych Consultation   Reason for Consult:  psych consult Referring Physician:  Sherwood Gambler, MD Location of Patient:  [facility] [phone] Location of Provider: [place] Department  Patient Identification: Michael Schmitt MRN:  [id number] Principal Diagnosis: Drug-seeking behavior Diagnosis:  Principal Problem:   Drug-seeking behavior Active Problems:   Personality disorder ([place])   Chronic Suicidal ideation   Total Time spent with patient: 20 minutes  Subjective:   Michael Schmitt is a 50 y.o. male patient admitted with suicidal ideations.  On assessment patient presents on the phone with unknown individual stating he's "ready" and demanding they come get him from hospital. Involuntary head movement noted. History of Tardive Dyskinesia; prescription for Ingrezza noted in chart, reordered.   States he is current seeing someone at [facility] for therapy and "spoke to someone the other day"; further states he hasn't had any medications (Lithium, Ingrezza) for 2 or 3 months. He endorses chronic suicidal ideations "for years"; denies any homicidal ideations, auditory or visual hallucinations. Patient states he is able to follow up with [facility] and denies any safety concerns at the time of this assessment.   EDRN note 02/25/21 0908: "Patient noted to have random outbursts of yelling. Upset at this time because MD will not prescribe Ativan/Xanax. Repeatedly asking when he will be "getting out of here."  EDRN note 02/25/21 0949: "Patient screaming "I want the phone." Noted to hit the wall in room. Security and [employer] called to room at this time."  HPI:  Michael Schmitt is a 50 year old male with past history of  Personality disorder, chronic pain syndrome, polysubstance abuse, long-term prescription benzo use, alcohol use disorder, chronic suicidal ideations, GAD who presented to [facility] voluntarily with complaints of suicidal ideations. Per chart review patient has presented to [facility]  02/24/21 (suicidal ideation), [facility] ED 01/13/21 (suicidal ideation), [facility] 01/12/21 (altered mental status), [place] [facility] 01/02/21 (suicidal ideation, [facility] 12/29/20 (neck pain). UDS -, BAL<10. PDMP reviewed, last prescription filled 08/14/20 Clonazepam 1 mg (20).   Past Psychiatric History: Personality disorder, chronic pain syndrome, polysubstance abuse, long-term prescription benzo use, alcohol use disorder, chronic suicidal ideations, GAD  Risk to Self:  pt denies Risk to Others:  pt denies Prior Inpatient Therapy:  yes Prior Outpatient Therapy:  yes  Past Medical History:  Past Medical History:  Diagnosis Date   Anxiety    Anxiety    Arthritis    cerv. stenosis, spondylosis, HNP- lower back , has been followed in pain clinic, has  had injection s in cerv. 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 Cervical three-four.   CARDIAC CATHETERIZATION N/A 01/01/2015   Procedure: Left Heart Cath and Coronary Angiography;  Surgeon: [name], MD;   Location: [place] CV LAB;  Service: Cardiovascular;  Laterality: N/A;   CARDIAC CATHETERIZATION N/A 04/05/2016   Procedure: Left Heart Cath and Coronary Angiography;  Surgeon: Belva Crome, MD;  Location: [place] CV LAB;  Service: Cardiovascular;  Laterality: N/A;   CARDIAC CATHETERIZATION N/A 04/05/2016   Procedure: Coronary Stent Intervention;  Surgeon: Belva Crome, MD;  Location: [place] CV [facility];  Service: Cardiovascular;  Laterality: N/A;   CARDIAC CATHETERIZATION N/A 04/05/2016   Procedure: Intravascular Ultrasound/IVUS;  Surgeon: Belva Crome, MD;  Location: [place] [facility] LAB;  Service: Cardiovascular;  Laterality: N/A;   NASAL SINUS SURGERY     2005   Family History:  Family History  Problem Relation Age of Onset   Prostate cancer Father    Hypertension Mother    Kidney Stones Mother    Anxiety disorder Mother    Depression Mother    COPD Sister    Hypertension Sister    Diabetes Sister    Depression Sister    Anxiety disorder Sister    Seizures Sister    ADD / ADHD Son    ADD / ADHD Daughter    Family Psychiatric  History: not noted   Social History:  Social History   Substance and Sexual Activity  Alcohol Use No   Alcohol/week: 0.0 standard drinks   Comment: last drinked in 3 months.      Social History   Substance and Sexual Activity  Drug Use No    Social History   Socioeconomic History   Marital status: Single    Spouse name: Not on file   Number of children: 3   Years of education: 12   Highest education level: Not on file  Occupational History    Comment: unemployed  Tobacco Use   Smoking status: Every Day    Packs/day: 2.00    Years: 17.00    Pack years: 34.00    Types: Cigarettes, Cigars   Smokeless tobacco: Former   Tobacco comments:    occassional snuff   Vaping Use   Vaping Use: Never used  Substance and Sexual Activity   Alcohol use: No    Alcohol/week: 0.0 standard drinks    Comment: last drinked in 3 months.    Drug  use: No   Sexual activity: Not Currently  Other Topics Concern   Not on file  Social History Narrative   Patient is right handed.   Patient drinks 2 sodas daily.   Social Determinants of Health   Financial Resource Strain: Not on file  Food Insecurity: Not on file  Transportation Needs: Not on file  Physical Activity: Not on file  Stress: Not on file  Social Connections: Not on file   Additional Social History:    Allergies:   Allergies  Allergen Reactions   Prednisone Other (See Comments)    Hypertension and makes him feel "spacey"   Amoxicillin Other (See Comments)    Other Reaction: ELEVATED BP   Hydrocodone Other (See Comments)    Causes headaches and makes the patient irritable   Varenicline Other (See Comments)    Suicidal thoughts   Other Anxiety    Other reaction(s): Other (See Comments) Patient stated that this keeps  him awake; "has an opposite effect on me"   Trazodone And Nefazodone Anxiety and Other (See Comments)    Patient stated that this keeps him awake; "has an opposite effect on me"    Labs:  Results for orders placed or performed during the hospital encounter of 02/24/21 (from the past 48 hour(s))  Comprehensive metabolic panel     Status: Abnormal   Collection Time: 02/24/21 10:54 PM  Result Value Ref Range   Sodium 138 135 - 145 mmol/L   Potassium 3.8 3.5 - 5.1 mmol/L   Chloride 107 98 - 111 mmol/L   CO2 22 22 - 32 mmol/L   Glucose, Bld 75 70 - 99 mg/dL    Comment: Glucose reference range applies only to samples taken after fasting for at least 8 hours.   BUN 7 6 - 20 mg/dL   Creatinine, Ser 0.77 0.61 - 1.24 mg/dL   Calcium 8.8 (L) 8.9 - 10.3 mg/dL   Total Protein 5.6 (L) 6.5 - 8.1 g/dL   Albumin 3.3 (L) 3.5 - 5.0 g/dL   AST 14 (L) 15 - 41 U/L   ALT 11 0 - 44 U/L   Alkaline Phosphatase 54 38 - 126 U/L   Total Bilirubin 0.4 0.3 - 1.2 mg/dL   GFR, Estimated >60 >60 mL/min    Comment: (NOTE) Calculated using the CKD-EPI Creatinine Equation  (2021)    Anion gap 9 5 - 15    Comment: Performed at [facility], [address], [place], [place] [address]  Ethanol     Status: None   Collection Time: 02/24/21 10:54 PM  Result Value Ref Range   Alcohol, Ethyl (B) <10 <10 mg/dL    Comment: (NOTE) Lowest detectable limit for serum alcohol is 10 mg/dL.  For medical purposes only. Performed at [facility], [address] [place], [place] [address]   Salicylate level     Status: Abnormal   Collection Time: 02/24/21 10:54 PM  Result Value Ref Range   Salicylate Lvl <1.5 (L) 7.0 - 30.0 mg/dL    Comment: Performed at [facility], [address] [place], [place] [address]  Acetaminophen level     Status: Abnormal   Collection Time: 02/24/21 10:54 PM  Result Value Ref Range   Acetaminophen (Tylenol), Serum <10 (L) 10 - 30 ug/mL    Comment: (NOTE) Therapeutic concentrations vary significantly. A range of 10-30 ug/mL  may be an effective concentration for many patients. However, some  are best treated at concentrations outside of this range. Acetaminophen concentrations >150 ug/mL at 4 hours after ingestion  and >50 ug/mL at 12 hours after ingestion are often associated with  toxic reactions.  Performed at [facility], [address] [place], [place] [address]   cbc     Status: Abnormal   Collection Time: 02/24/21 10:54 PM  Result Value Ref Range   WBC 5.3 4.0 - 10.5 K/uL   RBC 4.01 (L) 4.22 - 5.81 MIL/uL   Hemoglobin 12.0 (L) 13.0 - 17.0 g/dL   HCT 38.7 (L) 39.0 - 52.0 %   MCV 96.5 80.0 - 100.0 fL   MCH 29.9 26.0 - 34.0 pg   MCHC 31.0 30.0 - 36.0 g/dL   RDW 15.8 (H) 11.5 - 15.5 %   Platelets 175 150 - 400 K/uL   nRBC 0.0 0.0 - 0.2 %    Comment: Performed at [facility], [address], [place],  [address]  Rapid urine drug screen (hospital performed)  Status: None   Collection Time: 02/24/21 11:51 PM  Result Value Ref Range   Opiates NONE DETECTED NONE DETECTED   Cocaine NONE DETECTED NONE DETECTED    Benzodiazepines NONE DETECTED NONE DETECTED   Amphetamines NONE DETECTED NONE DETECTED   Tetrahydrocannabinol NONE DETECTED NONE DETECTED   Barbiturates NONE DETECTED NONE DETECTED    Comment: (NOTE) DRUG SCREEN FOR MEDICAL PURPOSES ONLY.  IF CONFIRMATION IS NEEDED FOR ANY PURPOSE, NOTIFY LAB WITHIN 5 DAYS.  LOWEST DETECTABLE LIMITS FOR URINE DRUG SCREEN Drug Class                     Cutoff (ng/mL) Amphetamine and metabolites    1000 Barbiturate and metabolites    200 Benzodiazepine                 341 Tricyclics and metabolites     300 Opiates and metabolites        300 Cocaine and metabolites        300 THC                            50 Performed at [facility], [address] [place], [place] [address]   Lithium level     Status: None   Collection Time: 02/25/21  8:59 AM  Result Value Ref Range   Lithium Lvl 0.68 0.60 - 1.20 mmol/L    Comment: Performed at [facility], [address] [place], [place] [address]  Lipid panel     Status: Abnormal   Collection Time: 02/25/21  8:59 AM  Result Value Ref Range   Cholesterol 106 0 - 200 mg/dL   Triglycerides 213 (H) <150 mg/dL   HDL 28 (L) >40 mg/dL   Total CHOL/HDL Ratio 3.8 RATIO   VLDL 43 (H) 0 - 40 mg/dL   LDL Cholesterol 35 0 - 99 mg/dL    Comment:        Total Cholesterol/HDL:CHD Risk Coronary Heart Disease Risk Table                     Men   Women  1/2 Average Risk   3.4   3.3  Average Risk       5.0   4.4  2 X Average Risk   9.6   7.1  3 X Average Risk  23.4   11.0        Use the calculated Patient Ratio above and the CHD Risk Table to determine the patient's CHD Risk.        ATP III CLASSIFICATION (LDL):  <100     mg/dL   Optimal  100-129  mg/dL   Near or Above                    Optimal  130-159  mg/dL   Borderline  160-189  mg/dL   High  >190     mg/dL   Very High Performed at [facility], [address], [place], [place] [address]     Medications:  Current Facility-Administered Medications   Medication Dose Route Frequency Provider Last Rate Last Admin   acetaminophen (TYLENOL) tablet 650 mg  650 mg Oral Q4H PRN Sherwood Gambler, MD   650 mg at 02/25/21 0849   alum & mag hydroxide-simeth (MAALOX/MYLANTA) 200-200-20 MG/5ML suspension 30 mL  30 mL Oral Q6H PRN Sherwood Gambler, MD       hydrOXYzine (ATARAX)  tablet 100 mg  100 mg Oral QHS Sherwood Gambler, MD       nicotine (NICODERM CQ - dosed in mg/24 hours) patch 21 mg  21 mg Transdermal Daily Sherwood Gambler, MD   21 mg at 02/25/21 0904   ondansetron (ZOFRAN) tablet 4 mg  4 mg Oral Q8H PRN Sherwood Gambler, MD       valbenazine [facility]) capsule 80 mg  80 mg Oral Daily Leevy-Johnson, Avontae Burkhead A, NP       Current Outpatient Medications  Medication Sig Dispense Refill   aspirin 81 MG EC tablet Take 1 tablet (81 mg total) by mouth daily. For heart health (Patient not taking: No sig reported) 30 tablet 1   atorvastatin (LIPITOR) 20 MG tablet Take 20 mg by mouth daily.     baclofen (LIORESAL) 10 MG tablet Take 10 mg by mouth 3 (three) times daily.     carvedilol (COREG) 6.25 MG tablet Take 6.25 mg by mouth 2 (two) times daily with a meal.     chlorproMAZINE (THORAZINE) 200 MG tablet Take 600 mg by mouth at bedtime. (Patient not taking: Reported on 08/21/2020)     clopidogrel (PLAVIX) 75 MG tablet Take 75 mg by mouth daily.     desvenlafaxine (PRISTIQ) 100 MG 24 hr tablet Take 100 mg by mouth daily. (Patient not taking: Reported on 08/21/2020)     DULoxetine (CYMBALTA) 60 MG capsule Take 60 mg by mouth every morning. (Patient not taking: Reported on 08/21/2020)     gabapentin (NEURONTIN) 800 MG tablet Take 800 mg by mouth 3 (three) times daily.     hydrOXYzine (VISTARIL) 100 MG capsule Take 100 mg by mouth at bedtime.     INGREZZA 80 MG capsule Take 80 mg by mouth daily.     lisinopril (ZESTRIL) 10 MG tablet Take 10 mg by mouth daily.     metFORMIN (GLUCOPHAGE) 1000 MG tablet Take 1,000 mg by mouth 2 (two) times daily.     mirtazapine  (REMERON) 15 MG tablet Take 7.5 mg by mouth at bedtime.     pantoprazole (PROTONIX) 40 MG tablet Take 1 tablet by mouth daily.     QUEtiapine (SEROQUEL) 300 MG tablet Take 2 tablets (600 mg total) by mouth at bedtime. 60 tablet 1   REXULTI 1 MG TABS tablet Take 1 mg by mouth daily.     rOPINIRole (REQUIP) 0.5 MG tablet Take 0.5 mg by mouth at bedtime.     venlafaxine XR (EFFEXOR XR) 75 MG 24 hr capsule Take 1 capsule (75 mg total) by mouth daily. 30 capsule 1    Musculoskeletal: Strength & Muscle Tone: within normal limits Gait & Station: normal Patient leans: N/A  Psychiatric Specialty Exam:  Presentation  General Appearance: Casual Eye Contact:Fair Speech:Clear and Coherent Speech Volume:Normal Handedness:No data recorded  Mood and Affect  Mood:Irritable Affect:Blunt  Thought Process  Thought Processes:Goal Directed Descriptions of Associations:Intact Orientation:Full (Time, Place and Person) Thought Content:Illogical History of Schizophrenia/Schizoaffective disorder:No data recorded Duration of Psychotic Symptoms:No data recorded Hallucinations:Hallucinations: None Ideas of Reference:None Suicidal Thoughts:Suicidal Thoughts: No Homicidal Thoughts:Homicidal Thoughts: No  Sensorium  Memory:Immediate Fair; Recent Fair; Remote Fair Judgment:Poor Insight:Shallow  Executive Functions  Concentration:Fair Attention Span:Fair [place]  Psychomotor Activity  Psychomotor Activity:Psychomotor Activity: Extrapyramidal Side Effects (EPS) Extrapyramidal Side Effects (EPS): Tardive Dyskinesia AIMS Completed?: No  Assets  Assets:Physical Health; Resilience; Social Support  Sleep  Sleep:Sleep: Fair   Physical Exam: Physical Exam Vitals and nursing note reviewed.  Constitutional:  Appearance: He is normal weight.  HENT:     Head: Normocephalic.     Nose: Nose normal.     Mouth/Throat:     Mouth: Mucous membranes are  moist.     Pharynx: Oropharynx is clear.     Comments: TD noted Eyes:     Pupils: Pupils are equal, round, and reactive to light.  Cardiovascular:     Rate and Rhythm: Normal rate.     Pulses: Normal pulses.  Pulmonary:     Effort: Pulmonary effort is normal.  Musculoskeletal:        General: Normal range of motion.     Cervical back: Normal range of motion.     Comments: TD noted; head, mouth.   Skin:    General: Skin is warm and dry.  Neurological:     Mental Status: He is alert. Mental status is at baseline.  Psychiatric:        Attention and Perception: Attention and perception normal.        Mood and Affect: Affect is blunt.        Speech: Speech normal.        Behavior: Behavior is agitated. Behavior is cooperative.        Thought Content: Thought content is not paranoid or delusional. Thought content does not include homicidal or suicidal ideation. Thought content does not include homicidal or suicidal plan.        Cognition and Memory: Cognition and memory normal.        Judgment: Judgment is impulsive.   Review of Systems  Psychiatric/Behavioral:  Negative for hallucinations and suicidal ideas.   All other systems reviewed and are negative. Blood pressure 123/73, pulse 62, temperature 98.4 F (36.9 C), resp. rate 18, SpO2 98 %. There is no height or weight on file to calculate BMI.  Treatment Plan Summary: Plan Discharge patient home with plan to follow-up with outpatient provider for continued care.   Disposition: No evidence of imminent risk to self or others at present.   Patient does not meet criteria for psychiatric inpatient admission. Supportive therapy provided about ongoing stressors. Discussed crisis plan, support from social network, calling 911, coming to the Emergency Department, and calling Suicide Hotline.  This service was provided via telemedicine using a 2-way, interactive audio and video technology.  Names of all persons participating in this  telemedicine service and their role in this encounter. Name: Oneida Alar Role: PMHNP  Name: Hampton Abbot Role: Attending MD  Name: Michael Schmitt Role: patient  Name:  Role:     Inda Merlin, NP 02/25/2021 11:01 AM

## 2021-02-25 NOTE — ED Notes (Signed)
Patient screaming "I want the phone." Noted to hit the wall in room. Security and [employer] called to room at this time.

## 2021-02-25 NOTE — ED Notes (Signed)
Pt meets inpt criteria, no beds at [facility].

## 2021-02-25 NOTE — ED Provider Notes (Signed)
Emergency Medicine Observation Re-evaluation Note  Michael Schmitt is a 50 y.o. male, seen on rounds today.  Pt initially presented to the ED for complaints of Suicidal Currently, the patient is awaiting psychiatric assessment and recommendations..  Physical Exam  BP 123/73 (BP Location: Right Arm)   Pulse 62   Temp 98.4 F (36.9 C)   Resp 18   SpO2 98%  Physical Exam General: Mild agitation Cardiac: Normal heart rate Lungs: Normal work of breathing Psych: Agitated, frustrated, no active suicidal plan.  ED Course / MDM  EKG:   I have reviewed the labs performed to date as well as medications administered while in observation.  Recent changes in the last 24 hours include patient medically clear based on my exam and review of blood work which is unremarkable.  Psychiatry/behavioral health evaluator recommended outpatient follow-up psychiatrically clear per their recommendation..  Plan  Current plan is for discharge with outpatient follow-up and resources.Michael Schmitt is not under involuntary commitment.     Elnora Morrison, MD 02/27/21 2340

## 2021-02-25 NOTE — ED Notes (Signed)
Patient's personal belongings returned to patient at this time.

## 2021-02-25 NOTE — ED Notes (Signed)
Patient being assessed by [facility] at this time.

## 2021-05-17 ENCOUNTER — Encounter: Payer: Self-pay | Admitting: Emergency Medicine

## 2021-05-17 ENCOUNTER — Emergency Department
Admission: EM | Admit: 2021-05-17 | Discharge: 2021-05-17 | Disposition: A | Discharge status: Home / Self Care | Payer: Medicare Other | Attending: Emergency Medicine | Admitting: Emergency Medicine

## 2021-05-17 ENCOUNTER — Other Ambulatory Visit: Payer: Self-pay

## 2021-05-17 DIAGNOSIS — I129 Hypertensive chronic kidney disease with stage 1 through stage 4 chronic kidney disease, or unspecified chronic kidney disease: Secondary | ICD-10-CM | POA: Diagnosis not present

## 2021-05-17 DIAGNOSIS — E1122 Type 2 diabetes mellitus with diabetic chronic kidney disease: Secondary | ICD-10-CM | POA: Insufficient documentation

## 2021-05-17 DIAGNOSIS — F331 Major depressive disorder, recurrent, moderate: Secondary | ICD-10-CM | POA: Diagnosis not present

## 2021-05-17 DIAGNOSIS — Z79899 Other long term (current) drug therapy: Secondary | ICD-10-CM | POA: Diagnosis not present

## 2021-05-17 DIAGNOSIS — F609 Personality disorder, unspecified: Secondary | ICD-10-CM | POA: Diagnosis present

## 2021-05-17 DIAGNOSIS — E119 Type 2 diabetes mellitus without complications: Secondary | ICD-10-CM

## 2021-05-17 DIAGNOSIS — N189 Chronic kidney disease, unspecified: Secondary | ICD-10-CM | POA: Diagnosis not present

## 2021-05-17 DIAGNOSIS — F411 Generalized anxiety disorder: Secondary | ICD-10-CM | POA: Diagnosis present

## 2021-05-17 DIAGNOSIS — J45909 Unspecified asthma, uncomplicated: Secondary | ICD-10-CM | POA: Diagnosis not present

## 2021-05-17 DIAGNOSIS — F17200 Nicotine dependence, unspecified, uncomplicated: Secondary | ICD-10-CM | POA: Diagnosis present

## 2021-05-17 DIAGNOSIS — Z7984 Long term (current) use of oral hypoglycemic drugs: Secondary | ICD-10-CM | POA: Diagnosis not present

## 2021-05-17 DIAGNOSIS — I2510 Atherosclerotic heart disease of native coronary artery without angina pectoris: Secondary | ICD-10-CM | POA: Diagnosis not present

## 2021-05-17 DIAGNOSIS — F1994 Other psychoactive substance use, unspecified with psychoactive substance-induced mood disorder: Secondary | ICD-10-CM | POA: Diagnosis present

## 2021-05-17 DIAGNOSIS — F333 Major depressive disorder, recurrent, severe with psychotic symptoms: Secondary | ICD-10-CM | POA: Diagnosis not present

## 2021-05-17 DIAGNOSIS — T50901A Poisoning by unspecified drugs, medicaments and biological substances, accidental (unintentional), initial encounter: Secondary | ICD-10-CM | POA: Diagnosis present

## 2021-05-17 DIAGNOSIS — R45851 Suicidal ideations: Secondary | ICD-10-CM | POA: Diagnosis present

## 2021-05-17 DIAGNOSIS — Z7901 Long term (current) use of anticoagulants: Secondary | ICD-10-CM | POA: Insufficient documentation

## 2021-05-17 DIAGNOSIS — F332 Major depressive disorder, recurrent severe without psychotic features: Secondary | ICD-10-CM | POA: Diagnosis present

## 2021-05-17 DIAGNOSIS — G4700 Insomnia, unspecified: Secondary | ICD-10-CM

## 2021-05-17 DIAGNOSIS — Z765 Malingerer [conscious simulation]: Secondary | ICD-10-CM

## 2021-05-17 LAB — URINE DRUG SCREEN, QUALITATIVE (ARMC ONLY)
Amphetamines, Ur Screen: NOT DETECTED
Barbiturates, Ur Screen: NOT DETECTED
Benzodiazepine, Ur Scrn: NOT DETECTED
Cannabinoid 50 Ng, Ur ~~LOC~~: NOT DETECTED
Cocaine Metabolite,Ur ~~LOC~~: NOT DETECTED
MDMA (Ecstasy)Ur Screen: NOT DETECTED
Methadone Scn, Ur: NOT DETECTED
Opiate, Ur Screen: NOT DETECTED
Phencyclidine (PCP) Ur S: NOT DETECTED
Tricyclic, Ur Screen: POSITIVE — AB

## 2021-05-17 LAB — CBC
HCT: 43 % (ref 39.0–52.0)
Hemoglobin: 13.4 g/dL (ref 13.0–17.0)
MCH: 30.6 pg (ref 26.0–34.0)
MCHC: 31.2 g/dL (ref 30.0–36.0)
MCV: 98.2 fL (ref 80.0–100.0)
Platelets: 209 10*3/uL (ref 150–400)
RBC: 4.38 MIL/uL (ref 4.22–5.81)
RDW: 13.9 % (ref 11.5–15.5)
WBC: 5.4 10*3/uL (ref 4.0–10.5)
nRBC: 0 % (ref 0.0–0.2)

## 2021-05-17 LAB — SALICYLATE LEVEL
Salicylate Lvl: 19.8 mg/dL (ref 7.0–30.0)
Salicylate Lvl: 13.4 mg/dL (ref 7.0–30.0)

## 2021-05-17 LAB — COMPREHENSIVE METABOLIC PANEL
ALT: 12 U/L (ref 0–44)
AST: 14 U/L — ABNORMAL LOW (ref 15–41)
Albumin: 4.1 g/dL (ref 3.5–5.0)
Alkaline Phosphatase: 63 U/L (ref 38–126)
Anion gap: 10 (ref 5–15)
BUN: 10 mg/dL (ref 6–20)
CO2: 24 mmol/L (ref 22–32)
Calcium: 9.1 mg/dL (ref 8.9–10.3)
Chloride: 107 mmol/L (ref 98–111)
Creatinine, Ser: 0.89 mg/dL (ref 0.61–1.24)
GFR, Estimated: >60 mL/min (ref >60)
Glucose, Bld: 74 mg/dL (ref 70–99)
Potassium: 3.7 mmol/L (ref 3.5–5.1)
Sodium: 141 mmol/L (ref 135–145)
Total Bilirubin: 0.6 mg/dL (ref 0.3–1.2)
Total Protein: 7 g/dL (ref 6.5–8.1)

## 2021-05-17 LAB — ACETAMINOPHEN LEVEL
Acetaminophen (Tylenol), Serum: <10 ug/mL — ABNORMAL LOW (ref 10–30)
Acetaminophen (Tylenol), Serum: <10 ug/mL — ABNORMAL LOW (ref 10–30)

## 2021-05-17 LAB — ETHANOL: Alcohol, Ethyl (B): <10 mg/dL (ref <10)

## 2021-05-17 MED ORDER — MIRTAZAPINE 15 MG PO TABS: 7.5000 mg | ORAL_TABLET | Freq: Every day | ORAL | Status: DC

## 2021-05-17 MED ORDER — QUETIAPINE FUMARATE 300 MG PO TABS: 600.0000 mg | ORAL_TABLET | Freq: Every day | ORAL | Status: DC

## 2021-05-17 MED ORDER — LORAZEPAM 2 MG PO TABS
2.0000 mg | ORAL_TABLET | Freq: Once | ORAL | Status: AC
  Administered 2021-05-17: 2 mg via ORAL
  Filled 2021-05-17: qty 1

## 2021-05-17 MED ORDER — VENLAFAXINE HCL ER 75 MG PO CP24
75.0000 mg | ORAL_CAPSULE | Freq: Every day | ORAL | Status: DC
  Filled 2021-05-17: qty 1

## 2021-05-17 NOTE — ED Triage Notes (Addendum)
Pt to ED voluntarily by by EMS c/o feeling suicidal, states wants to hit himself in the head with a hammer.  Denies drugs or alcohol tonight.  Pt A&Ox4, anxious, cooperative but antsy, denies A/V hallucinations.  Pt dressed into hospital appropriate scrubs by this RN and Lattie Haw, EDT.  Belongings placed into belongings bag and contents include: 1 blue t shirt, 1 pair blue shoes, 1 pair white socks, 1 pair blue jeans, 1 brown belt, 1 lighter, cigarettes, 1 cell phone, 1 black wallet, 1 blue underwear.

## 2021-05-17 NOTE — BH Assessment (Addendum)
Comprehensive Clinical Assessment (CCA) Note  05/17/2021 Michael Schmitt [id number] Recommendations for Services/Supports/Treatments: Consulted with Michael Schmitt., NP, recommended pt be observed overnight and reassessed in the AM. Notified Dr. Beather Arbour and Michael Mercury, RN of disposition recommendation.   Michael Schmitt is a 51 year old, English speaking, Caucasian male with a history of MDD and personality disorder. Pt also has a hx of alcohol, opiate, and cannabis use, OD hx, and substance-induced mood disorder. Pt presented to [facility] with complaints of SI with a plan to hit himself in the head with a hammer. Pt was later placed under IVC by EDP Dr. Beather Arbour due to safety concerns. Upon assessment, the pt. was confronted for hitting himself in the head while awaiting his evaluation. Pt explained, I'm frustrated. Pt reported that he has been suicidal for the last 10 years and he is tired of presenting the way he does in front of his kids. Pt explained that he has 2 kids in the home ages 74 and 68. Pt reported that he also resides with his sister and his mother. Pt identified his main stressors as his struggles with mental illness, bills, and making sure that his children get a quality education. Pt endorsed symptoms of depression such as anhedonia, sleep disturbance, hopelessness and difficulty concentrating. Pt reported that he paces up and down the hall constantly and is unmotivated to even watch TV. Pt endorsed having racing and intrusive thoughts. Pt initially endorsed SI, however as the session progressed the patient's suicidal statements became passive/decreased. Pt reported that he is connected to [facility] and that he is medication compliant. Pt explained that he'd recently ran out of his medicine and did not haVE refills, explaining that was half of his issue. Pt reported that he also has ACT Team. Pt reported that he has an upcoming appointment to receive [facility] on 05/19/21 in [place] and seemed excited about  trying it. Pt reported having a hx of suicide attempts. The pt. had flashes of insight and impaired judgement. Pt had linear and coherent thoughts. The pt. had a depressed mood and affect. Pt had fair insight, lacking judgement, and presented impulsively. Pt denies substance abuse. BAL/UDS are unremarkable. Pt had a normal appearance and made good eye contact. The pt denied SI with actual intent as well as HI, AV/H.    Chief Complaint:  Chief Complaint  Patient presents with   Suicidal   Visit Diagnosis: MDD current episode moderate    CCA Screening, Triage and Referral (STR)  Patient Reported Information How did you hear about [place]? Self  Referral name: Michael Schmitt [phone]  Referral phone number: No data recorded  Whom do you see for routine medical problems? Primary Care  Practice/Facility Name: [facility]  Practice/Facility Phone Number: No data recorded Name of Contact: No data recorded Contact Number: No data recorded Contact Fax Number: No data recorded Prescriber Name: No data recorded Prescriber Address (if known): No data recorded  What Is the Reason for Your Visit/Call Today? Pt to ED voluntarily by by EMS c/o feeling suicidal, states wants to hit himself in the head with a hammer.  How Long Has This Been Causing You Problems? > than 6 months  What Do You Feel Would Help You the Most Today? Treatment for Depression or other mood problem   Have You Recently Been in Any Inpatient Treatment (Hospital/Detox/Crisis Center/28-Day Program)? No  Name/Location of Program/Hospital:No data recorded How Long Were You There? No data recorded When Were You Discharged? No data  recorded  Have You Ever Received Services From [facility] Before? Yes  Who Do You See at [facility]? See Epic   Have You Recently Had Any Thoughts About Hurting Yourself? Yes  Are You Planning to Commit Suicide/Harm Yourself At This time? Yes   Have you Recently Had Thoughts  About Hurting Someone Michael Schmitt? No  Explanation: No data recorded  Have You Used Any Alcohol or Drugs in the Past 24 Hours? No  How Long Ago Did You Use Drugs or Alcohol? No data recorded What Did You Use and How Much? No data recorded  Do You Currently Have a Therapist/Psychiatrist? Yes  Name of Therapist/Psychiatrist: Pt has ACT Team through [place] Recently Discharged From Any Office Practice or Programs? No  Explanation of Discharge From Practice/Program: No data recorded    CCA Screening Triage Referral Assessment Type of Contact: Face-to-Face  Is this Initial or Reassessment? Initial Assessment  Date Telepsych consult ordered in [facility]:  02/24/21  Time Telepsych consult ordered in [facility]:  2210   Patient Reported Information Reviewed? Yes  Patient Left Without Being Seen? No data recorded Reason for Not Completing Assessment: No data recorded  Collateral Involvement: None provided   Does Patient Have a [place]? No data recorded Name and Contact of Legal Guardian: No data recorded If Minor and Not Living with Parent(s), Who has Custody? No data recorded Is CPS involved or ever been involved? Never  Is APS involved or ever been involved? Never   Patient Determined To Be At Risk for Harm To Self or Others Based on Review of Patient Reported Information or Presenting Complaint? Yes, for Self-Harm  Method: No data recorded Availability of Means: No data recorded Intent: No data recorded Notification Required: No data recorded Additional Information for Danger to Others Potential: No data recorded Additional Comments for Danger to Others Potential: No data recorded Are There Guns or Other Weapons in Your Home? No data recorded Types of Guns/Weapons: No data recorded Are These Weapons Safely Secured?                            No data recorded Who Could Verify You Are Able To Have These Secured: No data recorded Do You Have any  Outstanding Charges, Pending Court Dates, Parole/Probation? No data recorded Contacted To Inform of Risk of Harm To Self or Others: No data recorded  Location of Assessment: [facility] ED   Does Patient Present under Involuntary Commitment? No  IVC Papers Initial File Date: No data recorded  [place] of Residence: [place]   Patient Currently Receiving the Following Services: ACTT Architect); Medication Management   Determination of Need: Emergent (2 hours)   Options For Referral: Therapeutic Triage Services; ED Referral     CCA Biopsychosocial Intake/Chief Complaint:  Pt says he has been feeling suicidal for the last week or so.  Does not have any precusor for this feeling.  He says "I feel crazy."  Pt says he has an appointment at [facility] in [place] [employer] on 05/31.  He says he has been out of his medications since 05/12.  He takes Seroquel 600mg  at night; Effexor, Clonopin 1mg  twice daily.  He also has a blood pressure med (Lipitor 20mg ) he has run out of too.  He has not been sleeping or eating.  He is losing some weight from not eating.  Pt is denying any HI or A/V hallucinations.  Current Symptoms/Problems: Depression, feels like killing himself.   Patient Reported Schizophrenia/Schizoaffective Diagnosis in Past: No   Strengths: Family supports; stable housing; has fair insight  Preferences: No data recorded Abilities: No data recorded  Type of Services Patient Feels are Needed: No data recorded  Initial Clinical Notes/Concerns: No data recorded  Mental Health Symptoms Depression:   Change in energy/activity; Difficulty Concentrating; Hopelessness; Worthlessness; Irritability; Fatigue   Duration of Depressive symptoms:  Greater than two weeks   Mania:   Irritability; Racing thoughts   Anxiety:    Difficulty concentrating; Irritability; Restlessness; Tension; Worrying   Psychosis:   None   Duration of Psychotic symptoms: No data  recorded  Trauma:   None   Obsessions:   None   Compulsions:   Intended to reduce stress or prevent another outcome; "Driven" to perform behaviors/acts; Good insight; Intrusive/time consuming; Repeated behaviors/mental acts   Inattention:   None   Hyperactivity/Impulsivity:   None   Oppositional/Defiant Behaviors:   Easily annoyed   Emotional Irregularity:   Recurrent suicidal behaviors/gestures/threats; Potentially harmful impulsivity   Other Mood/Personality Symptoms:  No data recorded   Mental Status Exam Appearance and self-care  Stature:   Tall   Weight:   Average weight   Clothing:   Casual   Grooming:   Normal   Cosmetic use:   None   Posture/gait:   Normal   Motor activity:   Not Remarkable   Sensorium  Attention:   Normal   Concentration:   Normal   Orientation:   X5   Recall/memory:   Normal   Affect and Mood  Affect:   Depressed; Tearful   Mood:   Depressed; Pessimistic   Relating  Eye contact:   Normal   Facial expression:   Sad   Attitude toward examiner:   Cooperative   Thought and Language  Speech flow:  Normal   Thought content:   Appropriate to Mood and Circumstances   Preoccupation:   None   Hallucinations:   None   Organization:  No data recorded  [employer] of Knowledge:   Average   Intelligence:   Average   Abstraction:   Normal   Judgement:   Poor   Reality Testing:   Variable   Insight:   Present; Flashes of insight   Decision Making:   Impulsive   Social Functioning  Social Maturity:   Impulsive   Social Judgement:   Heedless   Stress  Stressors:   Relationship; Other (Comment) (The responsiblity of child care.)   Coping Ability:   Overwhelmed   Skill Deficits:   Activities of daily living; Decision making   Supports:   Family; Friends/Service system     Religion: Religion/Spirituality Are You A Religious Person?: Yes 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   CCA Employment/Education Employment/Work Situation: Employment / Work Situation Employment Situation: On disability Why is Patient on Disability: Per pt, "neck pain and depression." How Long has Patient Been on Disability: Since 2015. Patient's Job has Been Impacted by Current Illness: Yes Describe how Patient's  Job has Been Impacted: Pt is unable to work.  Has Patient ever Been in the [place]?: No  Education: Education Is Patient Currently Attending School?: No Last Grade Completed: 11 Did You Attend College?: No Did You Have An Individualized Education Program (IIEP): No Did You Have Any Difficulty At School?: No Patient's Education Has Been Impacted by Current Illness: No   CCA Family/Childhood History Family and Relationship History: Family history Marital status: Single Does patient have children?: Yes How many children?: 3 How is patient's relationship with their children?: Pt reports, two of his children live with him and his sister and that he has a good relationship with his children.  Childhood History:  Childhood History By whom was/is the patient raised?: Both Schmitt Did patient suffer any verbal/emotional/physical/sexual abuse as a child?: No Did patient suffer from severe childhood neglect?: No Has patient ever been sexually abused/assaulted/raped as an adolescent or adult?: No Was the patient ever a victim of a crime or a disaster?: No Witnessed domestic violence?: No Has patient been  affected by domestic violence as an adult?: No  Child/Adolescent Assessment:     CCA Substance Use Alcohol/Drug Use: Alcohol / Drug Use Pain Medications: See MAR Prescriptions: See MAR Over the Counter: See MAR History of alcohol / drug use?: Yes Longest period of sobriety (when/how long): Reports of no current use Negative Consequences of Use:  (n/a)                         ASAM's:  Six Dimensions of Multidimensional Assessment  Dimension 1:  Acute Intoxication and/or Withdrawal Potential:      Dimension 2:  Biomedical Conditions and Complications:      Dimension 3:  Emotional, Behavioral, or Cognitive Conditions and Complications:     Dimension 4:  Readiness to Change:     Dimension 5:  Relapse, Continued use, or Continued Problem Potential:     Dimension 6:  Recovery/Living Environment:     ASAM Severity Score:    ASAM Recommended Level of Treatment:     Substance use Disorder (SUD)    Recommendations for Services/Supports/Treatments:    DSM5 Diagnoses: Patient Active Problem List   Diagnosis Date Noted   Drug-seeking behavior 02/25/2021   Insomnia 08/22/2020   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   Lithium toxicity    Hyponatremia 01/24/2019   Substance induced mood disorder ([place]) 11/22/2018   Alcohol abuse with alcohol-induced mood disorder ([place]) 09/21/2018   Overdose 05/17/2018   Depression, major, recurrent, severe with psychosis ([place]) 03/30/2018   Personality disorder ([place]) 12/31/2017   MDD (major depressive disorder), recurrent episode, severe ([place]) 11/20/2017   Dysthymia 10/26/2017   Major depressive disorder, recurrent episode, moderate (HCC) 03/10/2017   Generalized anxiety disorder 02/05/2017   Chronic neck pain (Primary Area of Pain) (Left) 11/22/2016   Cervical facet syndrome (Left) 11/22/2016   Cervical radiculitis (Left) 11/22/2016   History of cervical spinal surgery 11/22/2016   Cervical spondylosis 11/22/2016    Chronic shoulder pain (Secondary Area of Pain) (Left) 11/22/2016   Arthropathy of shoulder (Left) 11/22/2016   Chronic low back pain [facility] Area of Pain) (Bilateral) (L>R) 11/22/2016   Lumbar facet syndrome (Bilateral) (L>R) 11/22/2016   Chronic pain syndrome 11/21/2016   Long term prescription benzodiazepine use 11/21/2016   Opiate use 11/21/2016   Type 2 diabetes mellitus without complication, without long-term current use of insulin ([place]) 10/19/2016   Chronic Suicidal ideation 09/15/2016   Coronary artery disease 09/12/2016  HTN (hypertension) 06/26/2016   GERD (gastroesophageal reflux disease) 06/26/2016   Alcohol use disorder, severe, 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[place]) 06/26/2016   Benzodiazepine abuse ([place]) 06/11/2016   Sedative, hypnotic or anxiolytic use disorder, severe, 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

## 2021-05-17 NOTE — Consult Note (Signed)
[facility] Consult   Reason for Consult:  self-harm threats Referring Physician:  EDP Patient Identification: Michael Schmitt MRN:  [id number] Principal Diagnosis: Major depressive disorder, recurrent episode, moderate ([place]) Diagnosis:  Principal Problem:   Major depressive disorder, recurrent episode, moderate ([place]) Active Problems:   Tobacco use disorder   Type 2 diabetes mellitus without complication, without long-term current use of insulin (HCC)   Generalized anxiety disorder   MDD (major depressive disorder), recurrent episode, severe (HCC)   Personality disorder ([place])   Depression, major, recurrent, severe with psychosis ([place])   Overdose   Substance induced mood disorder ([place])   Drug-seeking behavior   Total Time spent with patient: 20 minutes  Subjective:   Michael Schmitt is a 51 y.o. male patient admitted with depression.  HPI:  See previous consult note.  This morning, writer approached this patient as he was sleeping.  He was amenable to interview.  When asked what brought him to the emergency department, patient states that he was "lying there trying to go to sleep and I could not so I called EMS to come here."  Patient states that he got a very good night's sleep and he feels much better today.  He denies any thoughts of suicide today, although he has a chronic underlying passive suicidal ideation.  He states that he is to have [place], starting on Thursday, Feb 23,  at [facility].  He states he is looking forward to this and does not feel that he needs hospitalization at this time.  Patient has no plan or intent to harm himself.  He states that he wants to go home now.  Patient denies homicidal ideation, auditory or visual hallucinations or paranoia.  Patient is not anxious at this time and does not present with any delusional thinking.  Patient is requesting discharge.  Does not meet criteria for involuntary commitment.  Does not require inpatient psychiatric  treatment.  Past Psychiatric History: See previous  Risk to Self:   Risk to Others:   Prior Inpatient Therapy:   Prior Outpatient Therapy:    Past Medical History:  Past Medical History:  Diagnosis Date   Anxiety    Anxiety    Arthritis    cerv. stenosis, spondylosis, HNP- lower back , has been followed in pain clinic, has  had injection s in cerv. 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 Cervical three-four.   CARDIAC CATHETERIZATION N/A 01/01/2015  Procedure: Left Heart Cath and Coronary Angiography;  Surgeon: [name], MD;  Location: [facility];  Service: Cardiovascular;  Laterality: N/A;   CARDIAC CATHETERIZATION N/A 04/05/2016   Procedure: Left Heart Cath and Coronary Angiography;  Surgeon: Belva Crome, MD;  Location: [facility];  Service: Cardiovascular;  Laterality: N/A;   CARDIAC CATHETERIZATION N/A 04/05/2016   Procedure: Coronary Stent Intervention;  Surgeon: Belva Crome, MD;  Location: [facility];   Service: Cardiovascular;  Laterality: N/A;   CARDIAC CATHETERIZATION N/A 04/05/2016   Procedure: Intravascular Ultrasound/IVUS;  Surgeon: Belva Crome, MD;  Location: [facility];  Service: Cardiovascular;  Laterality: N/A;   NASAL SINUS SURGERY     2005   Family History:  Family History  Problem Relation Age of Onset   Prostate cancer Father    Hypertension Mother    Kidney Stones Mother    Anxiety disorder Mother    Depression Mother    COPD Sister    Hypertension Sister    Diabetes Sister    Depression Sister    Anxiety disorder Sister    Seizures Sister    ADD / ADHD Son    ADD / ADHD Daughter    Family Psychiatric  History: See previous Social History:  Social History   Substance and Sexual Activity  Alcohol Use No   Alcohol/week: 0.0 standard drinks   Comment: last drinked in 3 months.      Social History   Substance and Sexual Activity  Drug Use No    Social History   Socioeconomic History   Marital status: Single    Spouse name: Not on file   Number of children: 3   Years of education: 12   Highest education level: Not on file  Occupational History    Comment: unemployed  Tobacco Use   Smoking status: Every Day    Packs/day: 2.00    Years: 17.00    Pack years: 34.00    Types: Cigarettes, Cigars   Smokeless tobacco: Former   Tobacco comments:    occassional snuff   Vaping Use   Vaping Use: Never used  Substance and Sexual Activity   Alcohol use: No    Alcohol/week: 0.0 standard drinks    Comment: last drinked in 3 months.    Drug use: No   Sexual activity: Not Currently  Other Topics Concern   Not on file  Social History Narrative   Patient is right handed.   Patient drinks 2 sodas daily.   Social Determinants of Health   Financial Resource Strain: Not on file  Food Insecurity: Not on file  Transportation Needs: Not on file  Physical Activity: Not on file  Stress: Not on file  Social Connections: Not on file   Additional  Social History:    Allergies:   Allergies  Allergen Reactions   Prednisone Other (See Comments)    Hypertension and makes him feel "spacey"   Amoxicillin Other (See Comments)    Other Reaction: ELEVATED BP   Hydrocodone Other (See Comments)    Causes headaches and makes the patient irritable   Varenicline Other (See Comments)    Suicidal thoughts   Other Anxiety    Other reaction(s): Other (See Comments) Patient stated that this keeps him awake; "has an opposite effect on me"   Trazodone And Nefazodone Anxiety and Other (See Comments)    Patient stated that this keeps him awake; "has an opposite effect on  me"    Labs:  Results for orders placed or performed during the hospital encounter of 05/17/21 (from the past 48 hour(s))  Comprehensive metabolic panel     Status: Abnormal   Collection Time: 05/17/21  1:07 AM  Result Value Ref Range   Sodium 141 135 - 145 mmol/L   Potassium 3.7 3.5 - 5.1 mmol/L   Chloride 107 98 - 111 mmol/L   CO2 24 22 - 32 mmol/L   Glucose, Bld 74 70 - 99 mg/dL    Comment: Glucose reference range applies only to samples taken after fasting for at least 8 hours.   BUN 10 6 - 20 mg/dL   Creatinine, Ser 0.89 0.61 - 1.24 mg/dL   Calcium 9.1 8.9 - 10.3 mg/dL   Total Protein 7.0 6.5 - 8.1 g/dL   Albumin 4.1 3.5 - 5.0 g/dL   AST 14 (L) 15 - 41 U/L   ALT 12 0 - 44 U/L   Alkaline Phosphatase 63 38 - 126 U/L   Total Bilirubin 0.6 0.3 - 1.2 mg/dL   GFR, Estimated >60 >60 mL/min    Comment: (NOTE) Calculated using the CKD-EPI Creatinine Equation (2021)    Anion gap 10 5 - 15    Comment: Performed at [facility], [address] [place], [place] [address]  Ethanol     Status: None   Collection Time: 05/17/21  1:07 AM  Result Value Ref Range   Alcohol, Ethyl (B) <10 <10 mg/dL    Comment: (NOTE) Lowest detectable limit for serum alcohol is 10 mg/dL.  For medical purposes only. Performed at [facility], [address]  [place], [place] [address]   Salicylate level     Status: None   Collection Time: 05/17/21  1:07 AM  Result Value Ref Range   Salicylate Lvl 76.2 7.0 - 30.0 mg/dL    Comment: Performed at [facility], [address] [place], [place] [address]  Acetaminophen level     Status: Abnormal   Collection Time: 05/17/21  1:07 AM  Result Value Ref Range   Acetaminophen (Tylenol), Serum <10 (L) 10 - 30 ug/mL    Comment: (NOTE) Therapeutic concentrations vary significantly. A range of 10-30 ug/mL  may be an effective concentration for many patients. However, some  are best treated at concentrations outside of this range. Acetaminophen concentrations >150 ug/mL at 4 hours after ingestion  and >50 ug/mL at 12 hours after ingestion are often associated with  toxic reactions.  Performed at [facility], [address] [place], [place] [address]   cbc     Status: None   Collection Time: 05/17/21  1:07 AM  Result Value Ref Range   WBC 5.4 4.0 - 10.5 K/uL   RBC 4.38 4.22 - 5.81 MIL/uL   Hemoglobin 13.4 13.0 - 17.0 g/dL   HCT 43.0 39.0 - 52.0 %   MCV 98.2 80.0 - 100.0 fL   MCH 30.6 26.0 - 34.0 pg   MCHC 31.2 30.0 - 36.0 g/dL   RDW 13.9 11.5 - 15.5 %   Platelets 209 150 - 400 K/uL   nRBC 0.0 0.0 - 0.2 %    Comment: Performed at [facility], [address] [place], [place] [address]  Urine Drug Screen, Qualitative     Status: Abnormal   Collection Time: 05/17/21  1:07 AM  Result Value Ref Range   Tricyclic, Ur Screen POSITIVE (A) NONE DETECTED   Amphetamines, Ur Screen NONE DETECTED  NONE DETECTED   MDMA (Ecstasy)Ur Screen NONE DETECTED NONE DETECTED   Cocaine Metabolite,Ur [place] NONE DETECTED NONE DETECTED   Opiate, Ur Screen NONE DETECTED NONE DETECTED   Phencyclidine (PCP) Ur S NONE DETECTED NONE DETECTED   Cannabinoid 50 Ng, Ur [place] NONE DETECTED NONE DETECTED   Barbiturates, Ur Screen NONE DETECTED NONE DETECTED   Benzodiazepine, Ur Scrn NONE DETECTED NONE  DETECTED   Methadone Scn, Ur NONE DETECTED NONE DETECTED    Comment: (NOTE) Tricyclics + metabolites, urine    Cutoff 1000 ng/mL Amphetamines + metabolites, urine  Cutoff 1000 ng/mL MDMA (Ecstasy), urine              Cutoff 500 ng/mL Cocaine Metabolite, urine          Cutoff 300 ng/mL Opiate + metabolites, urine        Cutoff 300 ng/mL Phencyclidine (PCP), urine         Cutoff 25 ng/mL Cannabinoid, urine                 Cutoff 50 ng/mL Barbiturates + metabolites, urine  Cutoff 200 ng/mL Benzodiazepine, urine              Cutoff 200 ng/mL Methadone, urine                   Cutoff 300 ng/mL  The urine drug screen provides only a preliminary, unconfirmed analytical test result and should not be used for non-medical purposes. Clinical consideration and professional judgment should be applied to any positive drug screen result due to possible interfering substances. A more specific alternate chemical method must be used in order to obtain a confirmed analytical result. Gas chromatography / mass spectrometry (GC/MS) is the preferred confirm atory method. Performed at [facility], [address] [place], [place] [address]   Acetaminophen level     Status: Abnormal   Collection Time: 05/17/21  6:15 AM  Result Value Ref Range   Acetaminophen (Tylenol), Serum <10 (L) 10 - 30 ug/mL    Comment: (NOTE) Therapeutic concentrations vary significantly. A range of 10-30 ug/mL  may be an effective concentration for many patients. However, some  are best treated at concentrations outside of this range. Acetaminophen concentrations >150 ug/mL at 4 hours after ingestion  and >50 ug/mL at 12 hours after ingestion are often associated with  toxic reactions.  Performed at [facility], [address] [place], [place] [address]   Salicylate level     Status: None   Collection Time: 05/17/21  6:15 AM  Result Value Ref Range   Salicylate Lvl 66.2 7.0 - 30.0 mg/dL    Comment:  Performed at [facility], [address] [place], [place] [address]    No current facility-administered medications for this encounter.   Current Outpatient Medications  Medication Sig Dispense Refill   aspirin 81 MG EC tablet Take 1 tablet (81 mg total) by mouth daily. For heart health (Patient not taking: No sig reported) 30 tablet 1   atorvastatin (LIPITOR) 20 MG tablet Take 20 mg by mouth daily.     baclofen (LIORESAL) 10 MG tablet Take 10 mg by mouth 3 (three) times daily.     carvedilol (COREG) 6.25 MG tablet Take 6.25 mg by mouth 2 (two) times daily with a meal.     chlorproMAZINE (THORAZINE) 200 MG tablet Take 600 mg by mouth at bedtime. (Patient not taking: Reported on 08/21/2020)     clopidogrel (PLAVIX) 75 MG  tablet Take 75 mg by mouth daily.     desvenlafaxine (PRISTIQ) 100 MG 24 hr tablet Take 100 mg by mouth daily. (Patient not taking: Reported on 08/21/2020)     DULoxetine (CYMBALTA) 60 MG capsule Take 60 mg by mouth every morning. (Patient not taking: Reported on 08/21/2020)     gabapentin (NEURONTIN) 800 MG tablet Take 800 mg by mouth 3 (three) times daily.     hydrOXYzine (VISTARIL) 100 MG capsule Take 100 mg by mouth at bedtime.     INGREZZA 80 MG capsule Take 80 mg by mouth daily.     lisinopril (ZESTRIL) 10 MG tablet Take 10 mg by mouth daily.     metFORMIN (GLUCOPHAGE) 1000 MG tablet Take 1,000 mg by mouth 2 (two) times daily.     mirtazapine (REMERON) 15 MG tablet Take 7.5 mg by mouth at bedtime.     pantoprazole (PROTONIX) 40 MG tablet Take 1 tablet by mouth daily.     QUEtiapine (SEROQUEL) 300 MG tablet Take 2 tablets (600 mg total) by mouth at bedtime. 60 tablet 1   REXULTI 1 MG TABS tablet Take 1 mg by mouth daily.     rOPINIRole (REQUIP) 0.5 MG tablet Take 0.5 mg by mouth at bedtime.     venlafaxine XR (EFFEXOR XR) 75 MG 24 hr capsule Take 1 capsule (75 mg total) by mouth daily. 30 capsule 1    Musculoskeletal: Strength & Muscle Tone: within normal  limits Gait & Station: normal Patient leans: N/A   Psychiatric Specialty Exam:  Presentation  General Appearance: Appropriate for Environment  Eye Contact:Good  Speech:Clear and Coherent  Speech Volume:Normal  Handedness:Right   Mood and Affect  Mood:Euthymic  Affect:Blunt   Thought Process  Thought Processes:Coherent  Descriptions of Associations:Intact  Orientation:Full (Time, Place and Person)  Thought Content:WDL  History of Schizophrenia/Schizoaffective disorder:No  Duration of Psychotic Symptoms:No data recorded Hallucinations:Hallucinations: None  Ideas of Reference:None  Suicidal Thoughts:Suicidal Thoughts: Yes, Passive SI Passive Intent and/or Plan: Without Intent  Homicidal Thoughts:Homicidal Thoughts: No   Sensorium  Memory:Immediate Good  Judgment:Fair  Insight:Poor   Executive Functions  Concentration:Fair  Attention Span:Fair  College   Psychomotor Activity  Psychomotor Activity:Psychomotor Activity: Normal   Assets  Assets:Communication Skills; Desire for Improvement; Physical Health; Resilience; Social Support   Sleep  Sleep:Sleep: Good Number of Hours of Sleep: 6   Physical Exam: Physical Exam ROS Blood pressure 126/89, pulse 67, temperature 98.1 F (36.7 C), temperature source Oral, resp. rate 15, height 6\' 2"  (1.88 m), weight 92.5 kg, SpO2 100 %. Body mass index is 26.19 kg/m.  Treatment Plan Summary: Plan Patient not endorsing SI/HI/AVH . Can d/c  Disposition: No evidence of imminent risk to self or others at present.   Patient does not meet criteria for psychiatric inpatient admission. Discussed crisis plan, support from social network, calling 911, coming to the Emergency Department, and calling Suicide Hotline.  Sherlon Handing, NP 05/17/2021 5:30 PM

## 2021-05-17 NOTE — ED Notes (Signed)
Pt. Alert and oriented, warm and dry, in no distress. Pt. Denies SI currently, HI, and AVH. Pt states earlier tonight was having SI thought to hit self in head with hammer. Patient states he has previous suicide attempt by trying to shoot self with gun but the gun did not go off. Pt. Encouraged to let nursing staff know of any concerns or needs.

## 2021-05-17 NOTE — ED Notes (Signed)
Given all of belongings.  Verified correct patient and correct discharge papers given. Pt alert and oriented X 4, stable for discharge. RR even and unlabored, color WNL. Discussed discharge instructions and follow-up as directed. Discharge medications discussed, when prescribed. Pt had opportunity to ask questions, and RN available to provide patient and/or family education. Sister picking patient up.

## 2021-05-17 NOTE — ED Provider Notes (Addendum)
[facility] Provider Note    Event Date/Time   First MD Initiated Contact with Patient 05/17/21 0113     (approximate)   History   Suicidal   HPI  Michael Schmitt is a 51 y.o. male brought to the ED via EMS with a chief complaint of suicidal ideation.  Patient has a history of major depression, mood disorder who wants to hit himself in the head with a hammer.  Denies HI/AH/VH.  Voices no medical complaints.     Past Medical History   Past Medical History:  Diagnosis Date   Anxiety    Anxiety    Arthritis    cerv. stenosis, spondylosis, HNP- lower back , has been followed in pain clinic, has  had injection s in cerv. area   Blood dyscrasia    told that when he was young he was a" free bleeder"   CAD (coronary artery disease)    Cervical spondylosis without myelopathy 07/24/2014   Cervicogenic headache 07/24/2014   Chronic kidney disease    renal calculi- passed spontaneously   Depression    Diabetes mellitus without complication ([place])    Fatty liver    GERD (gastroesophageal reflux disease)    Headache(784.0)    Hyperlipidemia    Hypertension    Limb ischemia    right hand due to ulnar artery obstruction s/p injury   Mental disorder    MI, old    RLS (restless legs syndrome)    detected on sleep study   Shortness of breath    Ventricular fibrillation ([place]) 04/05/2016     Active Problem List   Patient Active Problem List   Diagnosis Date Noted   Drug-seeking behavior 02/25/2021   Insomnia 08/22/2020   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   Lithium toxicity    Hyponatremia 01/24/2019   Substance induced mood disorder ([place]) 11/22/2018   Alcohol abuse with alcohol-induced mood disorder ([place]) 09/21/2018   Overdose 05/17/2018   Depression, major, recurrent, severe with psychosis ([place]) 03/30/2018   Personality disorder ([place]) 12/31/2017   MDD (major depressive disorder), recurrent episode, severe ([place]) 11/20/2017    Dysthymia 10/26/2017   Major depressive disorder, recurrent episode, moderate (HCC) 03/10/2017   Generalized anxiety disorder 02/05/2017   Chronic neck pain (Primary Area of Pain) (Left) 11/22/2016   Cervical facet syndrome (Left) 11/22/2016   Cervical radiculitis (Left) 11/22/2016   History of cervical spinal surgery 11/22/2016   Cervical spondylosis 11/22/2016   Chronic shoulder pain (Secondary Area of Pain) (Left) 11/22/2016   Arthropathy of shoulder (Left) 11/22/2016   Chronic low back pain [facility] Area of Pain) (Bilateral) (L>R) 11/22/2016   Lumbar facet syndrome (Bilateral) (L>R) 11/22/2016   Chronic pain syndrome 11/21/2016   Long term prescription benzodiazepine use 11/21/2016   Opiate use 11/21/2016   Type 2 diabetes mellitus without complication, without long-term current use of insulin ([place]) 10/19/2016   Chronic Suicidal ideation 09/15/2016   Coronary artery disease 09/12/2016   HTN (hypertension) 06/26/2016   GERD (gastroesophageal reflux disease) 06/26/2016   Alcohol use disorder, severe, 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[place]) 06/26/2016   Benzodiazepine abuse ([place]) 06/11/2016   Sedative, hypnotic or anxiolytic use disorder, severe, 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 Cervical three-four.   CARDIAC CATHETERIZATION N/A 01/01/2015   Procedure: Left Heart Cath and Coronary Angiography;  Surgeon: [name], MD;  Location: [place] [facility];  Service:  Cardiovascular;  Laterality: N/A;   CARDIAC CATHETERIZATION N/A 04/05/2016   Procedure: Left Heart Cath and Coronary Angiography;  Surgeon: Belva Crome, MD;  Location: [place] [facility];  Service: Cardiovascular;  Laterality: N/A;   CARDIAC CATHETERIZATION N/A 04/05/2016   Procedure: Coronary Stent Intervention;  Surgeon: Belva Crome, MD;  Location: [place] [facility];  Service: Cardiovascular;  Laterality: N/A;   CARDIAC CATHETERIZATION N/A 04/05/2016   Procedure: Intravascular Ultrasound/IVUS;  Surgeon: Belva Crome, MD;  Location: [place] [facility];  Service: Cardiovascular;  Laterality: N/A;   NASAL SINUS SURGERY     2005     Home Medications   Prior to Admission medications   Medication Sig Start Date End Date Taking? Authorizing Provider  aspirin 81 MG EC tablet Take 1 tablet (81 mg total) by mouth daily. For heart health Patient not taking: No sig reported 11/21/17   Lindell Spar I, NP  atorvastatin (LIPITOR) 20 MG tablet Take 20 mg by mouth daily. 12/16/19   [provider]  baclofen (LIORESAL) 10 MG tablet Take 10 mg by mouth 3 (three) times daily. 07/06/20   [provider]  carvedilol (COREG) 6.25 MG tablet Take 6.25 mg by mouth 2 (two) times daily with a meal. 10/25/18 08/21/20  [provider]  chlorproMAZINE (THORAZINE) 200 MG tablet Take 600 mg by mouth at bedtime. Patient not taking: Reported on 08/21/2020 11/17/19   [provider]  clopidogrel (PLAVIX) 75 MG tablet Take 75 mg by mouth daily. 12/16/19   [provider]  desvenlafaxine (PRISTIQ) 100 MG 24 hr tablet Take 100 mg by mouth daily. Patient not taking: Reported on 08/21/2020 05/24/20   [provider]  DULoxetine (CYMBALTA) 60 MG capsule Take 60 mg by mouth every morning. Patient not taking: Reported on 08/21/2020 07/06/20   [provider]  gabapentin (NEURONTIN) 800 MG tablet Take 800 mg by mouth 3 (three) times daily. 07/06/20   [provider]   hydrOXYzine (VISTARIL) 100 MG capsule Take 100 mg by mouth at bedtime.    [provider]  INGREZZA 80 MG capsule Take 80 mg by mouth daily. 04/30/20   [provider]  lisinopril (ZESTRIL) 10 MG tablet Take 10 mg by mouth daily. 12/16/19   [provider]  metFORMIN (GLUCOPHAGE) 1000 MG tablet Take 1,000 mg by mouth 2 (two) times daily. 10/02/18   [provider]  mirtazapine (REMERON) 15 MG tablet Take 7.5 mg by mouth at bedtime. 07/06/20   [provider]  pantoprazole (PROTONIX) 40 MG tablet Take 1 tablet by mouth daily. 08/18/20   [provider]  QUEtiapine (SEROQUEL) 300 MG tablet Take 2 tablets (600 mg total) by mouth at bedtime. 08/24/20   Clapacs, Madie Reno, MD  REXULTI 1 MG TABS tablet Take 1 mg by mouth daily. 03/26/20   [provider]  rOPINIRole (REQUIP) 0.5 MG tablet Take 0.5 mg by mouth at bedtime. 07/07/20   [provider]  venlafaxine XR (EFFEXOR XR) 75 MG 24 hr capsule Take 1 capsule (75 mg total) by mouth daily. 08/24/20 08/24/21  Clapacs, Madie Reno, MD  Allergies  Prednisone, Amoxicillin, Hydrocodone, Varenicline, Other, and Trazodone and nefazodone   Family History   Family History  Problem Relation Age of Onset   Prostate cancer Father    Hypertension Mother    Kidney Stones Mother    Anxiety disorder Mother    Depression Mother    COPD Sister    Hypertension Sister    Diabetes Sister    Depression Sister    Anxiety disorder Sister    Seizures Sister    ADD / ADHD Son    ADD / ADHD Daughter      Physical Exam  Triage Vital Signs: ED Triage Vitals [05/17/21 0057]  Enc Vitals Group     BP (!) 162/107     Pulse Rate 84     Resp 16     Temp 98.1 F (36.7 C)     Temp Source Oral     SpO2 98 %     Weight 204 lb (92.5 kg)     Height 6\' 2"  (1.88 m)     Head Circumference      Peak Flow      Pain Score 0     Pain Loc      Pain Edu?      Excl. in [place]?     Updated Vital Signs: BP (!)  162/107 (BP Location: Left Arm)    Pulse 84    Temp 98.1 F (36.7 C) (Oral)    Resp 16    Ht 6\' 2"  (1.88 m)    Wt 92.5 kg    SpO2 98%    BMI 26.19 kg/m    General: Awake, no distress.  CV:  RRR.  Good peripheral perfusion.  Resp:  Normal effort.  CTAB. Abd:  Nontender.  No distention.  Other:  Mildly agitated   ED Results / Procedures / Treatments  Labs (all labs ordered are listed, but only abnormal results are displayed) Labs Reviewed  COMPREHENSIVE METABOLIC PANEL - Abnormal; Notable for the following components:      Result Value   AST 14 (*)    All other components within normal limits  ACETAMINOPHEN LEVEL - Abnormal; Notable for the following components:   Acetaminophen (Tylenol), Serum <10 (*)    All other components within normal limits  URINE DRUG SCREEN, QUALITATIVE ([facility] ONLY) - Abnormal; Notable for the following components:   Tricyclic, Ur Screen POSITIVE (*)    All other components within normal limits  ACETAMINOPHEN LEVEL - Abnormal; Notable for the following components:   Acetaminophen (Tylenol), Serum <10 (*)    All other components within normal limits  ETHANOL  SALICYLATE LEVEL  CBC  SALICYLATE LEVEL     EKG  None   RADIOLOGY None   Official radiology report(s): No results found.   PROCEDURES:  Critical Care performed: No  Procedures   MEDICATIONS ORDERED IN ED: Medications  QUEtiapine (SEROQUEL) tablet 600 mg (has no administration in time range)  mirtazapine (REMERON) tablet 7.5 mg (has no administration in time range)  venlafaxine XR (EFFEXOR-XR) 24 hr capsule 75 mg (has no administration in time range)  LORazepam (ATIVAN) tablet 2 mg (2 mg Oral Given 05/17/21 0139)     IMPRESSION / MDM / ASSESSMENT AND PLAN / ED COURSE  I reviewed the triage vital signs and the nursing notes.                             51 year old  male presenting with suicidal ideation.  Agitated, will administer Ativan.  Will ask pharmacy tech to reconcile  patient's home medications.  Patient still voicing active suicidal ideations, will place under IVC for his safety. The patient has been placed in psychiatric observation due to the need to provide a safe environment for the patient while obtaining psychiatric consultation and evaluation, as well as ongoing medical and medication management to treat the patient's condition.  The patient has been placed under full IVC at this time.  4175 Salicylate level 30.1; will obtain 4 hour level.  0223 Patient seen by psychiatric NP; for reassessment in the morning.  0404 Repeat salicylate level trending down and is within normal limits.  FINAL CLINICAL IMPRESSION(S) / ED DIAGNOSES   Final diagnoses:  Moderate episode of recurrent major depressive disorder ([place])     Rx / DC Orders   ED Discharge Orders     None        Note:  This document was prepared using Dragon voice recognition software and may include unintentional dictation errors.   Paulette Blanch, MD 05/17/21 5913    Paulette Blanch, MD 05/17/21 [phone]

## 2021-05-17 NOTE — ED Notes (Signed)
Refused breakfast.

## 2021-05-17 NOTE — Discharge Instructions (Addendum)
Please continue your psychiatric treatments through [facility] as planned.  Follow-up with your psychiatrist and primary doctor.  You have been seen in the Emergency Department (ED) today for a psychiatric complaint.  You have been evaluated by psychiatry and we believe you are safe to be discharged from the hospital.    Please return to the ED immediately if you have ANY thoughts of hurting yourself or anyone else, so that we may help you.  Please avoid alcohol and drug use.  Follow up with your doctor and/or therapist as soon as possible regarding today's ED visit.   Please follow up any other recommendations and clinic appointments provided by the psychiatry team that saw you in the Emergency Department.

## 2021-05-17 NOTE — Consult Note (Signed)
[facility] Consult   Reason for Consult: Suicidal Referring Physician: Dr. Beather Arbour Patient Identification: Michael Schmitt MRN:  [id number] Principal Diagnosis: <principal problem not specified> Diagnosis:  Active Problems:   Tobacco use disorder   Type 2 diabetes mellitus without complication, without long-term current use of insulin (HCC)   Generalized anxiety disorder   Major depressive disorder, recurrent episode, moderate (HCC)   MDD (major depressive disorder), recurrent episode, severe ([place])   Personality disorder ([facility])   Depression, major, recurrent, severe with psychosis ([place])   Overdose   Substance induced mood disorder ([place])   Drug-seeking behavior   Total Time spent with patient: 1 hour  Subjective: "I want to hit my head with a hammer." Michael Schmitt is a 51 y.o. male patient presented to [facility] ED via EMS c/o feeling suicidal, states wants to hit himself in the head with a hammer. The patient stated that he has been suicidal for 10 years. He shared that nothing works for him. The patient shared that on Thursday 02.23.23 he is schedule to have Transcranial magnetic stimulation (TMS) is a noninvasive procedure that uses magnetic fields to stimulate nerve cells in the brain to improve symptoms of depression. TMS is typically used when other depression treatments haven't been effective. This treatment for depression involves delivering repetitive magnetic pulses, so it's called repetitive Lagrange or rTMS (per [facility] clinic). During the patient assessment, he presents to have attention-seeking behaviors. He is laughing and talking, and at one point, he shared, "I am not going to hit my head with a hammer." This provider saw the patient face-to-face; the chart was reviewed, and consulted with Dr. Beather Arbour on 05/17/2021 due to the patient's care. It was discussed with the EDP that the patient remained under observation overnight and will be reassessed in the a.m. to  determine if he meets the criteria for psychiatric inpatient admission; he could be discharged home. On evaluation, the patient is alert and oriented x 4, anxious, depressed, cooperative, and mood-congruent with affect. The patient does not appear to be responding to internal or external stimuli. The patient is presenting with some delusional thinking. The patient denies auditory or visual hallucinations. The patient admits to chronic suicidal ideation. The patient denies homicidal or self-harm ideations. The patient is not presenting with any psychotic or paranoid behaviors.   HPI: Per Dr. Beather Arbour, Michael Schmitt is a 51 y.o. male brought to the ED via EMS with a chief complaint of suicidal ideation.  Patient has a history of major depression, mood disorder who wants to hit himself in the head with a hammer.  Denies HI/AH/VH.  Voices no medical complaints.  Past Psychiatric History:  Anxiety Depression  Risk to Self:   Risk to Others:   Prior Inpatient Therapy:   Prior Outpatient Therapy:    Past Medical History:  Past Medical History:  Diagnosis Date   Anxiety    Anxiety    Arthritis    cerv. stenosis, spondylosis, HNP- lower back , has been followed in pain clinic, has  had injection s in cerv. 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 Cervical three-four.   CARDIAC CATHETERIZATION N/A 01/01/2015   Procedure: Left Heart Cath and Coronary Angiography;  Surgeon: [name], MD;  Location: [place] [facility];  Service: Cardiovascular;  Laterality: N/A;   CARDIAC CATHETERIZATION N/A 04/05/2016   Procedure: Left Heart Cath and Coronary Angiography;  Surgeon: Belva Crome, MD;  Location: [place] [facility];  Service: Cardiovascular;  Laterality: N/A;   CARDIAC CATHETERIZATION N/A 04/05/2016   Procedure: Coronary Stent Intervention;  Surgeon: Belva Crome, MD;  Location: [place] [facility];  Service: Cardiovascular;  Laterality: N/A;   CARDIAC CATHETERIZATION N/A 04/05/2016   Procedure: Intravascular Ultrasound/IVUS;  Surgeon: Belva Crome, MD;  Location: [place] [facility];  Service: Cardiovascular;  Laterality: N/A;   NASAL SINUS SURGERY     2005   Family History:  Family History  Problem Relation Age of Onset   Prostate cancer Father    Hypertension Mother    Kidney Stones Mother    Anxiety disorder Mother    Depression Mother    COPD Sister    Hypertension Sister    Diabetes Sister    Depression Sister    Anxiety disorder Sister    Seizures Sister    ADD / ADHD Son    ADD / ADHD Daughter    Family Psychiatric  History:  Social History:  Social History   Substance and Sexual Activity  Alcohol Use No   Alcohol/week: 0.0 standard drinks   Comment: last drinked in 3 months.      Social History   Substance and Sexual Activity  Drug Use No    Social History   Socioeconomic History   Marital status: Single    Spouse name: Not on file   Number of children: 3   Years of education: 12   Highest education level:  Not on file  Occupational History    Comment: unemployed  Tobacco Use   Smoking status: Every Day    Packs/day: 2.00    Years: 17.00    Pack years: 34.00    Types: Cigarettes, Cigars   Smokeless tobacco: Former   Tobacco comments:    occassional snuff   Vaping Use   Vaping Use: Never used  Substance and Sexual Activity   Alcohol use: No    Alcohol/week: 0.0 standard drinks    Comment: last drinked in 3 months.    Drug use: No   Sexual activity: Not Currently  Other Topics Concern   Not on file  Social History Narrative   Patient is right handed.   Patient drinks 2 sodas daily.   Social Determinants of Health   Financial Resource Strain: Not on file  Food Insecurity: Not on file  Transportation Needs: Not on file  Physical Activity: Not on file  Stress: Not on file  Social Connections: Not on file  Additional Social History:    Allergies:   Allergies  Allergen Reactions   Prednisone Other (See Comments)    Hypertension and makes him feel "spacey"   Amoxicillin Other (See Comments)    Other Reaction: ELEVATED BP   Hydrocodone Other (See Comments)    Causes headaches and makes the patient irritable   Varenicline Other (See Comments)    Suicidal thoughts   Other Anxiety    Other reaction(s): Other (See Comments) Patient stated that this keeps him awake; "has an opposite effect on me"   Trazodone And Nefazodone Anxiety and Other (See Comments)    Patient stated that this keeps him awake; "has an opposite effect on me"    Labs:  Results for orders placed or performed during the hospital encounter of 05/17/21 (from the past 48 hour(s))  Comprehensive metabolic panel     Status: Abnormal   Collection Time: 05/17/21  1:07 AM  Result Value Ref Range   Sodium 141 135 - 145 mmol/L   Potassium 3.7 3.5 - 5.1 mmol/L   Chloride 107 98 - 111 mmol/L   CO2 24 22 - 32 mmol/L   Glucose, Bld 74 70 - 99 mg/dL    Comment: Glucose reference range applies only to samples  taken after fasting for at least 8 hours.   BUN 10 6 - 20 mg/dL   Creatinine, Ser 0.89 0.61 - 1.24 mg/dL   Calcium 9.1 8.9 - 10.3 mg/dL   Total Protein 7.0 6.5 - 8.1 g/dL   Albumin 4.1 3.5 - 5.0 g/dL   AST 14 (L) 15 - 41 U/L   ALT 12 0 - 44 U/L   Alkaline Phosphatase 63 38 - 126 U/L   Total Bilirubin 0.6 0.3 - 1.2 mg/dL   GFR, Estimated >60 >60 mL/min    Comment: (NOTE) Calculated using the CKD-EPI Creatinine Equation (2021)    Anion gap 10 5 - 15    Comment: Performed at [facility], [address] [place], [place] [address]  Ethanol     Status: None   Collection Time: 05/17/21  1:07 AM  Result Value Ref Range   Alcohol, Ethyl (B) <10 <10 mg/dL    Comment: (NOTE) Lowest detectable limit for serum alcohol is 10 mg/dL.  For medical purposes only. Performed at [facility], [address] [place], [place] [address]   Salicylate level     Status: None   Collection Time: 05/17/21  1:07 AM  Result Value Ref Range   Salicylate Lvl 96.2 7.0 - 30.0 mg/dL    Comment: Performed at [facility], [address] [place], [place] [address]  Acetaminophen level     Status: Abnormal   Collection Time: 05/17/21  1:07 AM  Result Value Ref Range   Acetaminophen (Tylenol), Serum <10 (L) 10 - 30 ug/mL    Comment: (NOTE) Therapeutic concentrations vary significantly. A range of 10-30 ug/mL  may be an effective concentration for many patients. However, some  are best treated at concentrations outside of this range. Acetaminophen concentrations >150 ug/mL at 4 hours after ingestion  and >50 ug/mL at 12 hours after ingestion are often associated with  toxic reactions.  Performed at [facility], [address] [place], [place] [address]   cbc     Status: None   Collection Time: 05/17/21  1:07 AM  Result Value Ref Range   WBC 5.4 4.0 - 10.5 K/uL   RBC 4.38 4.22 - 5.81 MIL/uL  Hemoglobin 13.4 13.0 - 17.0 g/dL   HCT 43.0 39.0 - 52.0 %   MCV 98.2  80.0 - 100.0 fL   MCH 30.6 26.0 - 34.0 pg   MCHC 31.2 30.0 - 36.0 g/dL   RDW 13.9 11.5 - 15.5 %   Platelets 209 150 - 400 K/uL   nRBC 0.0 0.0 - 0.2 %    Comment: Performed at [facility], [address] [place], [place] [address]  Urine Drug Screen, Qualitative     Status: Abnormal   Collection Time: 05/17/21  1:07 AM  Result Value Ref Range   Tricyclic, Ur Screen POSITIVE (A) NONE DETECTED   Amphetamines, Ur Screen NONE DETECTED NONE DETECTED   MDMA (Ecstasy)Ur Screen NONE DETECTED NONE DETECTED   Cocaine Metabolite,Ur [place] NONE DETECTED NONE DETECTED   Opiate, Ur Screen NONE DETECTED NONE DETECTED   Phencyclidine (PCP) Ur S NONE DETECTED NONE DETECTED   Cannabinoid 50 Ng, Ur [place] NONE DETECTED NONE DETECTED   Barbiturates, Ur Screen NONE DETECTED NONE DETECTED   Benzodiazepine, Ur Scrn NONE DETECTED NONE DETECTED   Methadone Scn, Ur NONE DETECTED NONE DETECTED    Comment: (NOTE) Tricyclics + metabolites, urine    Cutoff 1000 ng/mL Amphetamines + metabolites, urine  Cutoff 1000 ng/mL MDMA (Ecstasy), urine              Cutoff 500 ng/mL Cocaine Metabolite, urine          Cutoff 300 ng/mL Opiate + metabolites, urine        Cutoff 300 ng/mL Phencyclidine (PCP), urine         Cutoff 25 ng/mL Cannabinoid, urine                 Cutoff 50 ng/mL Barbiturates + metabolites, urine  Cutoff 200 ng/mL Benzodiazepine, urine              Cutoff 200 ng/mL Methadone, urine                   Cutoff 300 ng/mL  The urine drug screen provides only a preliminary, unconfirmed analytical test result and should not be used for non-medical purposes. Clinical consideration and professional judgment should be applied to any positive drug screen result due to possible interfering substances. A more specific alternate chemical method must be used in order to obtain a confirmed analytical result. Gas chromatography / mass spectrometry (GC/MS) is the preferred confirm atory method. Performed at  [facility], [address] [place], [place] [address]     Current Facility-Administered Medications  Medication Dose Route Frequency Provider Last Rate Last Admin   mirtazapine (REMERON) tablet 7.5 mg  7.5 mg Oral QHS Caroline Sauger, NP       QUEtiapine (SEROQUEL) tablet 600 mg  600 mg Oral QHS Caroline Sauger, NP       venlafaxine XR (EFFEXOR-XR) 24 hr capsule 75 mg  75 mg Oral Daily Caroline Sauger, NP       Current Outpatient Medications  Medication Sig Dispense Refill   aspirin 81 MG EC tablet Take 1 tablet (81 mg total) by mouth daily. For heart health (Patient not taking: No sig reported) 30 tablet 1   atorvastatin (LIPITOR) 20 MG tablet Take 20 mg by mouth daily.     baclofen (LIORESAL) 10 MG tablet Take 10 mg by mouth 3 (three) times daily.     carvedilol (COREG) 6.25 MG tablet Take 6.25 mg by mouth 2 (two) times daily with a meal.  chlorproMAZINE (THORAZINE) 200 MG tablet Take 600 mg by mouth at bedtime. (Patient not taking: Reported on 08/21/2020)     clopidogrel (PLAVIX) 75 MG tablet Take 75 mg by mouth daily.     desvenlafaxine (PRISTIQ) 100 MG 24 hr tablet Take 100 mg by mouth daily. (Patient not taking: Reported on 08/21/2020)     DULoxetine (CYMBALTA) 60 MG capsule Take 60 mg by mouth every morning. (Patient not taking: Reported on 08/21/2020)     gabapentin (NEURONTIN) 800 MG tablet Take 800 mg by mouth 3 (three) times daily.     hydrOXYzine (VISTARIL) 100 MG capsule Take 100 mg by mouth at bedtime.     INGREZZA 80 MG capsule Take 80 mg by mouth daily.     lisinopril (ZESTRIL) 10 MG tablet Take 10 mg by mouth daily.     metFORMIN (GLUCOPHAGE) 1000 MG tablet Take 1,000 mg by mouth 2 (two) times daily.     mirtazapine (REMERON) 15 MG tablet Take 7.5 mg by mouth at bedtime.     pantoprazole (PROTONIX) 40 MG tablet Take 1 tablet by mouth daily.     QUEtiapine (SEROQUEL) 300 MG tablet Take 2 tablets (600 mg total) by mouth at bedtime. 60 tablet 1    REXULTI 1 MG TABS tablet Take 1 mg by mouth daily.     rOPINIRole (REQUIP) 0.5 MG tablet Take 0.5 mg by mouth at bedtime.     venlafaxine XR (EFFEXOR XR) 75 MG 24 hr capsule Take 1 capsule (75 mg total) by mouth daily. 30 capsule 1    Musculoskeletal: Strength & Muscle Tone: within normal limits Gait & Station: normal Patient leans: N/A  Psychiatric Specialty Exam:  Presentation  General Appearance: Casual  Eye Contact:Good  Speech:Clear and Coherent  Speech Volume:Normal  Handedness:Right   Mood and Affect  Mood:Depressed  Affect:Depressed   Thought Process  Thought Processes:Goal Directed  Descriptions of Associations:Intact  Orientation:Full (Time, Place and Person)  Thought Content:Illogical  History of Schizophrenia/Schizoaffective disorder:No data recorded Duration of Psychotic Symptoms:No data recorded Hallucinations:Hallucinations: None  Ideas of Reference:None  Suicidal Thoughts:Suicidal Thoughts: Yes, Passive SI Passive Intent and/or Plan: With Plan; With Means to [place]  Homicidal Thoughts:Homicidal Thoughts: No   Sensorium  Memory:Immediate Good; Recent Good; Remote Good  Judgment:Poor  Insight:Lacking   Executive Functions  Concentration:Fair  Attention Span:Fair  [place]ucar[place]ones   Psychomotor Activity  Psychomotor Activity:Psychomotor Activity: Normal   Assets  Assets:Communication Skills; Desire for Improvement; Physical Health; Resilience; Social Support   Sleep  Sleep:Sleep: Fair Number of Hours of Sleep: 6   Physical Exam: Physical Exam Vitals and nursing note reviewed.  Constitutional:      Appearance: Normal appearance.  HENT:     Head: Normocephalic and atraumatic.     Right Ear: External ear normal.     Left Ear: External ear normal.     Nose: Nose normal.     Mouth/Throat:     Mouth: Mucous membranes are moist.  Cardiovascular:     Rate and Rhythm: Normal rate.      Pulses: Normal pulses.  Pulmonary:     Effort: Pulmonary effort is normal.  Musculoskeletal:        General: Normal range of motion.     Cervical back: Normal range of motion and neck supple.  Neurological:     Mental Status: He is alert and oriented to person, place, and time.  Psychiatric:        Attention  and Perception: Attention and perception normal.        Mood and Affect: Mood is depressed. Affect is blunt and inappropriate.        Speech: Speech is tangential.        Behavior: Behavior is withdrawn. Behavior is cooperative.        Thought Content: Thought content includes suicidal ideation. Thought content includes suicidal plan.        Cognition and Memory: Cognition and memory normal.        Judgment: Judgment is inappropriate.   Review of Systems  Psychiatric/Behavioral:  Positive for depression and suicidal ideas. The patient is nervous/anxious and has insomnia.   All other systems reviewed and are negative. Blood pressure (!) 162/107, pulse 84, temperature 98.1 F (36.7 C), temperature source Oral, resp. rate 16, height 6\' 2"  (1.88 m), weight 92.5 kg, SpO2 98 %. Body mass index is 26.19 kg/m.  Treatment Plan Summary: Daily contact with patient to assess and evaluate symptoms and progress in treatment, Medication management, and Plan The patient remained under observation overnight and will be reassessed in the a.m. to determine if he meets the criteria for psychiatric inpatient admission; he could be discharged home.  Disposition: Supportive therapy provided about ongoing stressors. The patient remained under observation overnight and will be reassessed in the a.m. to determine if he meets the criteria for psychiatric inpatient admission; he could be discharged home.  Caroline Sauger, NP 05/17/2021 2:50 AM

## 2021-05-17 NOTE — ED Notes (Signed)
IVC / pending reassessment in the AM. 

## 2021-05-17 NOTE — ED Provider Notes (Signed)
Vitals:   05/17/21 0057 05/17/21 0913  BP: (!) 162/107 126/89  Pulse: 84 67  Resp: 16 15  Temp: 98.1 F (36.7 C)   SpO2: 98% 100%     Patient has been seen and cleared for discharge with his IVC rescinded by psychiatry nurse practitioner Waldon Merl.  Patient advised his sister will be picking him up to drive him home.  He will continue to follow-up with his treatments and actually has treatment planned with [facility] psychiatry this week  Return precautions and treatment recommendations and provided for the patient who is agreeable with the plan.    Delman Kitten, MD 05/17/21 [phone]

## 2021-06-16 ENCOUNTER — Other Ambulatory Visit: Payer: Self-pay

## 2021-06-16 ENCOUNTER — Emergency Department (HOSPITAL_COMMUNITY)
Admission: EM | Admit: 2021-06-16 | Discharge: 2021-06-18 | Disposition: A | Discharge status: Other Acute Inpatient Hospital | Payer: Medicare Other | Attending: Emergency Medicine | Admitting: Emergency Medicine

## 2021-06-16 ENCOUNTER — Encounter (HOSPITAL_COMMUNITY): Payer: Self-pay | Admitting: Emergency Medicine

## 2021-06-16 DIAGNOSIS — R45851 Suicidal ideations: Secondary | ICD-10-CM | POA: Diagnosis not present

## 2021-06-16 DIAGNOSIS — Z7984 Long term (current) use of oral hypoglycemic drugs: Secondary | ICD-10-CM | POA: Diagnosis not present

## 2021-06-16 DIAGNOSIS — G894 Chronic pain syndrome: Secondary | ICD-10-CM | POA: Diagnosis not present

## 2021-06-16 DIAGNOSIS — Z79899 Other long term (current) drug therapy: Secondary | ICD-10-CM | POA: Insufficient documentation

## 2021-06-16 DIAGNOSIS — Z20822 Contact with and (suspected) exposure to covid-19: Secondary | ICD-10-CM | POA: Insufficient documentation

## 2021-06-16 DIAGNOSIS — Z7982 Long term (current) use of aspirin: Secondary | ICD-10-CM | POA: Insufficient documentation

## 2021-06-16 DIAGNOSIS — Z7902 Long term (current) use of antithrombotics/antiplatelets: Secondary | ICD-10-CM | POA: Insufficient documentation

## 2021-06-16 DIAGNOSIS — Z765 Malingerer [conscious simulation]: Secondary | ICD-10-CM

## 2021-06-16 DIAGNOSIS — F609 Personality disorder, unspecified: Secondary | ICD-10-CM | POA: Diagnosis present

## 2021-06-16 NOTE — ED Notes (Signed)
Pt has been dressed out and all belongings are placed in a bag. Pt has been wanded by security.   ?

## 2021-06-16 NOTE — ED Triage Notes (Signed)
Pt here voluntarily for SI. States he would hit self in head with hammer. Denies hallucinations. Denies ETOH or drug use. ?

## 2021-06-16 NOTE — ED Notes (Signed)
Pt is now having TTS ?

## 2021-06-16 NOTE — ED Notes (Signed)
Pt speaking with TTS at this time.  

## 2021-06-17 DIAGNOSIS — F609 Personality disorder, unspecified: Secondary | ICD-10-CM

## 2021-06-17 LAB — COMPREHENSIVE METABOLIC PANEL
ALT: 11 U/L (ref 0–44)
AST: 12 U/L — ABNORMAL LOW (ref 15–41)
Albumin: 3.7 g/dL (ref 3.5–5.0)
Alkaline Phosphatase: 57 U/L (ref 38–126)
Anion gap: 8 (ref 5–15)
BUN: 14 mg/dL (ref 6–20)
CO2: 23 mmol/L (ref 22–32)
Calcium: 8.7 mg/dL — ABNORMAL LOW (ref 8.9–10.3)
Chloride: 110 mmol/L (ref 98–111)
Creatinine, Ser: 1.11 mg/dL (ref 0.61–1.24)
GFR, Estimated: >60 mL/min (ref >60)
Glucose, Bld: 87 mg/dL (ref 70–99)
Potassium: 3.6 mmol/L (ref 3.5–5.1)
Sodium: 141 mmol/L (ref 135–145)
Total Bilirubin: 0.5 mg/dL (ref 0.3–1.2)
Total Protein: 6.5 g/dL (ref 6.5–8.1)

## 2021-06-17 LAB — ETHANOL: Alcohol, Ethyl (B): <10 mg/dL (ref <10)

## 2021-06-17 LAB — RAPID URINE DRUG SCREEN, HOSP PERFORMED
Amphetamines: NOT DETECTED
Barbiturates: NOT DETECTED
Benzodiazepines: NOT DETECTED
Cocaine: NOT DETECTED
Opiates: NOT DETECTED
Tetrahydrocannabinol: NOT DETECTED

## 2021-06-17 LAB — CBC
HCT: 38.1 % — ABNORMAL LOW (ref 39.0–52.0)
Hemoglobin: 12.1 g/dL — ABNORMAL LOW (ref 13.0–17.0)
MCH: 32.1 pg (ref 26.0–34.0)
MCHC: 31.8 g/dL (ref 30.0–36.0)
MCV: 101.1 fL — ABNORMAL HIGH (ref 80.0–100.0)
Platelets: 213 10*3/uL (ref 150–400)
RBC: 3.77 MIL/uL — ABNORMAL LOW (ref 4.22–5.81)
RDW: 13.5 % (ref 11.5–15.5)
WBC: 7 10*3/uL (ref 4.0–10.5)
nRBC: 0 % (ref 0.0–0.2)

## 2021-06-17 LAB — ACETAMINOPHEN LEVEL: Acetaminophen (Tylenol), Serum: 26 ug/mL (ref 10–30)

## 2021-06-17 LAB — RESP PANEL BY RT-PCR (FLU A&B, COVID) ARPGX2
Influenza A by PCR: NEGATIVE
Influenza B by PCR: NEGATIVE
SARS Coronavirus 2 by RT PCR: NEGATIVE

## 2021-06-17 LAB — SALICYLATE LEVEL: Salicylate Lvl: 13.8 mg/dL (ref 7.0–30.0)

## 2021-06-17 MED ORDER — MIRTAZAPINE 30 MG PO TBDP
30.0000 mg | ORAL_TABLET | Freq: Every day | ORAL | Status: DC
  Administered 2021-06-17: 30 mg via ORAL
  Filled 2021-06-17 (×3): qty 1

## 2021-06-17 MED ORDER — ZOLPIDEM TARTRATE 5 MG PO TABS
5.0000 mg | ORAL_TABLET | Freq: Every evening | ORAL | Status: DC | PRN
  Administered 2021-06-17: 5 mg via ORAL
  Filled 2021-06-17: qty 1

## 2021-06-17 MED ORDER — DOXEPIN HCL 25 MG PO CAPS
25.0000 mg | ORAL_CAPSULE | Freq: Every day | ORAL | Status: DC
  Administered 2021-06-17: 25 mg via ORAL
  Filled 2021-06-17 (×3): qty 1

## 2021-06-17 MED ORDER — VENLAFAXINE HCL ER 75 MG PO CP24
75.0000 mg | ORAL_CAPSULE | Freq: Every day | ORAL | Status: DC
  Administered 2021-06-17 – 2021-06-18 (×2): 75 mg via ORAL
  Filled 2021-06-17 (×5): qty 1

## 2021-06-17 MED ORDER — NICOTINE 21 MG/24HR TD PT24
21.0000 mg | MEDICATED_PATCH | Freq: Every day | TRANSDERMAL | Status: DC
  Administered 2021-06-17 – 2021-06-18 (×2): 21 mg via TRANSDERMAL
  Filled 2021-06-17 (×2): qty 1

## 2021-06-17 MED ORDER — QUETIAPINE FUMARATE 100 MG PO TABS
400.0000 mg | ORAL_TABLET | Freq: Every day | ORAL | Status: DC
  Administered 2021-06-17: 400 mg via ORAL
  Filled 2021-06-17: qty 4

## 2021-06-17 MED ORDER — DIVALPROEX SODIUM 250 MG PO DR TAB
1000.0000 mg | DELAYED_RELEASE_TABLET | Freq: Every day | ORAL | Status: DC
Start: 2021-06-17 — End: 2021-06-18
  Administered 2021-06-17: 1000 mg via ORAL
  Filled 2021-06-17: qty 4

## 2021-06-17 MED ORDER — LORAZEPAM 0.5 MG PO TABS: 0.5000 mg | ORAL_TABLET | Freq: Four times a day (QID) | ORAL | Status: DC | PRN

## 2021-06-17 MED ORDER — AMLODIPINE BESYLATE 5 MG PO TABS
5.0000 mg | ORAL_TABLET | Freq: Every day | ORAL | Status: DC
  Administered 2021-06-17 – 2021-06-18 (×2): 5 mg via ORAL
  Filled 2021-06-17 (×2): qty 1

## 2021-06-17 MED ORDER — LORAZEPAM 1 MG PO TABS
2.0000 mg | ORAL_TABLET | Freq: Four times a day (QID) | ORAL | Status: DC | PRN
Start: 2021-06-17 — End: 2021-06-17
  Administered 2021-06-17: 2 mg via ORAL
  Filled 2021-06-17: qty 2

## 2021-06-17 MED ORDER — IBUPROFEN 400 MG PO TABS
600.0000 mg | ORAL_TABLET | Freq: Three times a day (TID) | ORAL | Status: DC | PRN
  Administered 2021-06-17 – 2021-06-18 (×2): 600 mg via ORAL
  Filled 2021-06-17 (×2): qty 2

## 2021-06-17 MED ORDER — DIVALPROEX SODIUM 250 MG PO DR TAB
500.0000 mg | DELAYED_RELEASE_TABLET | Freq: Every day | ORAL | Status: DC
  Administered 2021-06-17 – 2021-06-18 (×2): 500 mg via ORAL
  Filled 2021-06-17 (×2): qty 2

## 2021-06-17 MED ORDER — ONDANSETRON HCL 4 MG PO TABS: 4.0000 mg | ORAL_TABLET | Freq: Three times a day (TID) | ORAL | Status: DC | PRN

## 2021-06-17 MED ORDER — ALUM & MAG HYDROXIDE-SIMETH 200-200-20 MG/5ML PO SUSP: 30.0000 mL | Freq: Four times a day (QID) | ORAL | Status: DC | PRN

## 2021-06-17 NOTE — Consult Note (Signed)
Telepsych Consultation  ? ?Reason for Consult:  psych consult ?Referring Physician:  Dorise Bullion, MD ?Location of Patient:  [facility] ?Location of Provider: [facility] Department ? ?Patient Identification: Michael Schmitt ?MRN:  [id number] ?Principal Diagnosis: Personality disorder ([place]) ?Diagnosis:  Principal Problem: ?  Personality disorder ([place]) ?Active Problems: ?  Chronic Suicidal ideation ?  Chronic pain syndrome ?  Drug-seeking behavior ? ? ?Total Time spent with patient: 15 minutes ? ?Subjective:   ?Michael Schmitt is a 51 y.o. male patient admitted with suicidal ideations. ? ?Patient presents alert and oriented. Patient reports chronic suicidal ideations. Requesting to be transferred to "place in [place] where I can stay for 30 days to get myself together". Provider attempted to explain to patient that there was no known plan or knowledge of such place. Patient then stated he wanted to go to "[facility]" further stating he was told "last night that I would be going to [facility]". Provider attempted to explain being unaware of such plan when he stopped answering questions and asked to stop assessment.  ? ?Provider notified, patient was accepted to [facility] for 06/18/21 admission at 1138 am today.  ? ?Per chart review patient has extensive history of personality disorder and chronic suicidal ideations. He has presented to [facility] (05/09/21), [facility] (05/17/21), [facility] (05/27/21), [facility] (05/28/21), [facility] (06/01/21), [facility] (06/16/21).  ? ?Per EDP note [facility] 06/01/21:  ?"Michael Schmitt is a 51 y.o., White race, Not Hispanic, Latino/a, or Spanish origin ethnicity, ENGLISH speaking male with a history of MDD, anxiety and unspecified personality disorder , who presents for evaluation of ongoing SI with plan to hit self with a hammer. This is patient's 3rd visit to a local ED in the past week for a similar presentation, in which he endorses SI, with plan and no intent on initial  interview and will later recant SI altogether. Patient is well known to PES. Patient with a remote history of reported possible lethal suicide attempt and will historically notify family prior to low lethality attempts, with the last episode being in 2022. Patient has close follow up with ACTT and [facility]. Given the chronicity of SI and expression of SI in the context of ongoing, last standing psychosocial stressors, patient would benefit from continued close follow up with current outpatient providers for coping skill development and distress tolerance. Patient was encouraged to utilized available crisis services in his community and services provided by ACTT.  ? ?A thorough psychiatric evaluation has been completed including evaluation of the patient, collecting collateral history from ACTT, reviewing available medical/clinic records, evaluating his unique risk and protective factors, and discussing treatment recommendations".  ? ?Collateral: [facility] ACT[facility] [phone] ([place]) [phone] ?States patient is currently being followed by team and was last seen by staff (psychiatrist) in office on yesterday, 06/14/21. Spoke with staff on phone yesterday, worked with staff on Monday (personal centered plan). States patient is actively engaged in services. Discussed patient's recent presentation to [facility] where patient reported to have had SI with plan (hammer), states patient had family stressors (sister in hospital, mom has some dementia); further stated once patient did not receive Ativan he told staff he was no longer suicidal and wanted to go home. Says patient had been requesting Ativan from providers and has been explained several times that it would not be prescribed. Patient currently receiving [facility] in [facility]; last received yesterday, missed today due to current ED visit. States yesterday pt was "pill hunting" for which medication  was "going to make him feel better" since the provider would not prescribe him Ativan.  Expressed ongoing stressors in the home related to sister and mother's health, poor coping.  ? ?HPI:  Michael Schmitt is a 51 year old male patient with a past psychiatric history of polysubstance abuse, drug seeking behavior, multiple psychiatric admissions. Currently being followed by [facility] ACT[facility] Team. UDS-, BAL<10.  ? ?Past Psychiatric History: anxiety, depression, polysubstance abuse, drug seeking behavior, multiple psychiatric admissions ? ?Risk to Self:   ?Risk to Others:   ?Prior Inpatient Therapy:   ?Prior Outpatient Therapy:   ? ?Past Medical History:  ?Past Medical History:  ?Diagnosis Date  ? Anxiety   ? Anxiety   ? Arthritis   ? cerv. stenosis, spondylosis, HNP- lower back , has been followed in pain clinic, has  had injection s in cerv. area  ? Blood dyscrasia   ? told that when he was young he was a" free bleeder"  ? CAD (coronary artery disease)   ? Cervical spondylosis without myelopathy 07/24/2014  ? Cervicogenic headache 07/24/2014  ? Chronic kidney disease   ? renal calculi- passed spontaneously  ? Depression   ? Diabetes mellitus without complication ([place])   ? Fatty liver   ? GERD (gastroesophageal reflux disease)   ? Headache(784.0)   ? Hyperlipidemia   ? Hypertension   ? Limb ischemia   ? 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 Mental disorder   ? MI, old   ? RLS (restless legs syndrome)   ? detected on sleep study  ? Shortness of breath   ? Ventricular fibrillation ([place]) 04/05/2016  ?  ?Past Surgical History:  ?Procedure Laterality Date  ? ANTERIOR CERVICAL DECOMP/DISCECTOMY FUSION  11/13/2011  ? Procedure: ANTERIOR CERVICAL DECOMPRESSION/DISCECTOMY FUSION 1 LEVEL;  Surgeon: [name], MD;  Location: [facility];  Service: Neurosurgery;  Laterality: N/A;  Anterior Cervical Decompression/discectomy Fusion. Cervical three-four.  ? CARDIAC CATHETERIZATION N/A 01/01/2015  ? Procedure: Left Heart Cath and Coronary Angiography;  Surgeon: [name], MD;  Location: [facility] CV  LAB;  Service: Cardiovascular;  Laterality: N/A;  ? CARDIAC CATHETERIZATION N/A 04/05/2016  ? Procedure: Left Heart Cath and Coronary Angiography;  Surgeon: Belva Crome, MD;  Location: [facility] CV LAB;  Service: Cardiovascular;  Laterality: N/A;  ? CARDIAC CATHETERIZATION N/A 04/05/2016  ? Procedure: Coronary Stent Intervention;  Surgeon: Belva Crome, MD;  Location: [facility] LAB;  Service: Cardiovascular;  Laterality: N/A;  ? CARDIAC CATHETERIZATION N/A 04/05/2016  ? Procedure: Intravascular Ultrasound/IVUS;  Surgeon: Belva Crome, MD;  Location: [facility] LAB;  Service: Cardiovascular;  Laterality: N/A;  ? NASAL SINUS SURGERY    ? 2005  ? ?Family History:  ?Family History  ?Problem Relation Age of Onset  ? Prostate cancer Father   ? Hypertension Mother   ? Kidney Stones Mother   ? Anxiety disorder Mother   ? Depression Mother   ? COPD Sister   ? Hypertension Sister   ? Diabetes Sister   ? Depression Sister   ? Anxiety disorder Sister   ? Seizures Sister   ? ADD / ADHD Son   ? ADD / ADHD Daughter   ? ?Family Psychiatric  History: not noted.  ?Social History:  ?Social History  ? ?Substance and Sexual Activity  ?Alcohol Use No  ? Alcohol/week: 0.0 standard drinks  ? Comment: last drinked in 3 months.   ?   ?  Social History  ? ?Substance and Sexual Activity  ?Drug Use No  ?  ?Social History  ? ?Socioeconomic History  ? Marital status: Single  ?  Spouse name: Not on file  ? Number of children: 3  ? Years of education: 80  ? Highest education level: Not on file  ?Occupational History  ?  Comment: unemployed  ?Tobacco Use  ? Smoking status: Every Day  ?  Packs/day: 2.00  ?  Years: 17.00  ?  Pack years: 34.00  ?  Types: Cigarettes, Cigars  ? Smokeless tobacco: Former  ? Tobacco comments:  ?  occassional snuff   ?Vaping Use  ? Vaping Use: Never used  ?Substance and Sexual Activity  ? Alcohol use: No  ?  Alcohol/week: 0.0 standard drinks  ?  Comment: last drinked in 3 months.   ? Drug use: No  ? Sexual  activity: Not Currently  ?Other Topics Concern  ? Not on file  ?Social History Narrative  ? Patient is right handed.  ? Patient drinks 2 sodas daily.  ? ?Social Determinants of Health  ? ?Financial Resource Strain:

## 2021-06-17 NOTE — ED Notes (Signed)
Spoke with pharmacy regarding Effexor not available, pharmacy stated will send medication to ED ?

## 2021-06-17 NOTE — ED Notes (Signed)
Received call from Earle Gell with [facility] who states pt is part of their ACT program. They have an updated list of pt's medications that our [facility] NP was requesting as pt's meds are not up to date in epic system with [facility]. He requested that a medical record release form be signed and faxed to them so they can send updated med list. Pt agreed for med rec release and signed forms, sent to [facility] per request. Secretary notified to be on look out for fax with updated medication list from [facility]. Pharmacy states to give list to pharm tech rounding this evening and she will update med list in epic.  ?

## 2021-06-17 NOTE — BH Assessment (Signed)
Comprehensive Clinical Assessment (CCA) Note ? ?06/17/2021 ?Michael Schmitt ?[id number] ? ?DISPOSITION: Gave clinical report to Michael John, PA-C who determined Pt meets criteria for inpatient psychiatric treatment. Notified Dr. Corene Cornea Schmitt and Michael Leaver, RN of recommendation. ? ?The patient demonstrates the following risk factors for suicide: Chronic risk factors for suicide include: psychiatric disorder of major depressive disorder, substance use disorder, previous suicide attempts by putting a loaded gun to his head, previous self-harm by hitting himself in the head, medical illness chronic neck pain, and chronic pain. Acute risk factors for suicide include: family or marital conflict and social withdrawal/isolation. Protective factors for this patient include: positive social support, positive therapeutic relationship, and responsibility to others (children, family). Considering these factors, the overall suicide risk at this point appears to be high. Patient is not appropriate for outpatient follow up. ? ?[facility] ED from 06/16/2021 in [facility] ED from 05/17/2021 in [name] from 02/24/2021 in [facility]  ?C-SSRS RISK CATEGORY High Risk High Risk High Risk  ? ?  ? ?Pt is a 51 year old male who presents unaccompanied to [facility] reporting suicidal ideation with plan to hit himself in the head with a hammer. He has a diagnosis of major depressive disorder and personality disorder. He reports he has attempted suicide in the past by putting a loaded gun to his head and pulling the trigger but the gun did not fire. During assessment, Pt hit himself in the head with his fists. Pt's medical record indicates a history of hitting himself. Pt describes his mood as depressed and  Pt acknowledges symptoms including social withdrawal, loss of interest in usual pleasures, fatigue, irritability, decreased concentration,  decreased sleep, and feelings of hopelessness. Pt endorsed having racing and intrusive thoughts. He denies current homicidal ideation. Medical record indicates a history of aggressive behavior but not recently. He denies auditory or visual hallucinations.  ? ?Pt denies recent substance use. Pt's medical record indicates he has a history of alcohol abuse and a history of benzodiazepine dependence. In the past, he has been on high doses of benzodiazepines with excessive use as well as a history of alcohol use in the past. Has a history of presentation frequently to the hospital asking for more sedative medicines. During assessment, Pt was asking for medication for neck pain. ? ?Pt describe his mental health symptoms as his primary stressor. He says he lives with his mother, sister, and Pt's two children, ages 22 and 80. He is on disability due to mental health and neck problems. He denies history of abuse. He reports he is currently on probation. He denies access to firearms. ? ?Pt is currently receiving ACTT services through [facility]. He says he spoke with them today but says they did not help and he still wants to kill himself. He states he is taking medications as prescribed. He has been psychiatrically hospitalized several times in the past and was help for 24 hour observation at [facility] one month ago after presenting with symptoms similar to tonight. He states his last psychiatric admission was at [facility] approximately nine months ago. ? ?Pt gives permission to contact his sister, Michael Schmitt ([phone]), for collateral information. Call went directly to unidentified voicemail and HIPAA-compliant message was recorded. ? ?Pt is casually dressed, alert and oriented x4. Pt speaks in a mumbled tone, at moderate volume and normal pace. Motor behavior appears slightly restless and at times Pt hit himself in the head  with his fists. Eye contact is fair. Pt's mood is depressed and affect is irritable. Thought process is  coherent and relevant. There is no indication Pt is currently responding to internal stimuli or experiencing delusional thought content. Pt was minimally cooperative during assessment and said he did not like answering questions. He says he is willing to sign voluntarily into a psychiatric facility if recommended. ? ? ?Chief Complaint:  ?Chief Complaint  ?Patient presents with  ? V70.1  ? ?Visit Diagnosis: F33.2 Major depressive disorder, Recurrent episode, Severe ? ? ?CCA Screening, Triage and Referral (STR) ? ?Patient Reported Information ?How did you hear about [place]? Self ? ?What Is the Reason for Your Visit/Call Today? Pt has a diagnosis of major depressive disorder and personality disorder. He report current suicidal ideation with plan to hit himself in the head with a hammer. He is unable to contract for safety at this time. ? ?How Long Has This Been Causing You Problems? > than 6 months ? ?What Do You Feel Would Help You the Most Today? Treatment for Depression or other mood problem; Medication(s) ? ? ?Have You Recently Had Any Thoughts About Hurting Yourself? Yes ? ?Are You Planning to Commit Suicide/Harm Yourself At This time? Yes ? ? ?Have you Recently Had Thoughts About [place]? No ? ?Are You Planning to Harm Someone at This Time? No ? ?Explanation: No data recorded ? ?Have You Used Any Alcohol or Drugs in the Past 24 Hours? No ? ?How Long Ago Did You Use Drugs or Alcohol? No data recorded ?What Did You Use and How Much? No data recorded ? ?Do You Currently Have a Therapist/Psychiatrist? Yes ? ?Name of Therapist/Psychiatrist: ACTT services through [facility]. ? ? ?Have You Been Recently Discharged From Any Office Practice or Programs? No ? ?Explanation of Discharge From Practice/Program: No data recorded ? ?  ?CCA Screening Triage Referral Assessment ?Type of Contact: Tele-Assessment ? ?Telemedicine Service Delivery: Telemedicine service delivery: This service was provided via telemedicine using a  2-way, interactive audio and video technology ? ?Is this Initial or Reassessment? Initial Assessment ? ?Date Telepsych consult ordered in [facility]:  06/16/21 ? ?Time Telepsych consult ordered in [facility]:  2328 ? ?Location of Assessment: [facility] ? ?Provider Location: [facility] Assessment Services ? ? ?Collateral Involvement: Medical record ? ? ?Does Patient Have a Stage manager Guardian? No data recorded ?Name and Contact of Legal Guardian: No data recorded ?If Minor and Not Living with Parent(s), Who has Custody? Schmitt ? ?Is CPS involved or ever been involved? Never ? ?Is APS involved or ever been involved? Never ? ? ?Patient Determined To Be At Risk for Harm To Self or Others Based on Review of Patient Reported Information or Presenting Complaint? Yes, for Self-Harm ? ?Method: No data recorded ?Availability of Means: No data recorded ?Intent: No data recorded ?Notification Required: No data recorded ?Additional Information for Danger to Others Potential: No data recorded ?Additional Comments for Danger to Others Potential: No data recorded ?Are There Guns or Other Weapons in [place]? No data recorded ?Types of Guns/Weapons: No data recorded ?Are These Weapons Safely Secured?                            No data recorded ?Who Could Verify You Are Able To Have These Secured: No data recorded ?Do You Have any Outstanding Charges, Pending Court Dates, Parole/Probation? No data recorded ?Contacted To Inform of Risk of Harm To  Self or Others: Unable to Contact: ? ? ? ?Does Patient Present under Involuntary Commitment? No ? ?IVC Papers Initial File Date: No data recorded ? ?[place] of Residence: [place] ? ? ?Patient Currently Receiving the Following Services: ACTT Designer, fashion/clothing Treatment); Medication Management ? ? ?Determination of Need: Emergent (2 hours) ? ? ?Options For Referral: Inpatient Hospitalization; Outpatient Therapy; Medication Management ? ? ? ? ?CCA Biopsychosocial ?Patient Reported  Schizophrenia/Schizoaffective Diagnosis in Past: No ? ? ?Strengths: Family supports; stable housing; has fair insight ? ? ?Mental Health Symptoms ?Depression:   ?Change in energy/activity; Difficulty Concentrating; Hopelessness; W

## 2021-06-17 NOTE — Progress Notes (Signed)
[facility] LCSW Progress Note ?  ?06/17/2021    11:38 AM ? ?Michael Schmitt  ? ?[id number]  ? ?Type of Contact and Topic:  Psychiatric Bed Placement  ? ?Pt accepted to [facility]    ? ?Patient meets inpatient criteria per [name], PA-C ? ?The attending provider will be Jonelle Sports, MD  ? ?Call report to [phone] or [phone] ? ?[place] Welch, RN @ [facility] ED notified.    ? ?Pt scheduled  to arrive at [facility]. ? ?Mariea Clonts, MSW, LCSW-A  ?11:40 AM 06/17/2021   ?  ? ?  ?  ? ? ? ? ?  ?

## 2021-06-17 NOTE — Progress Notes (Signed)
Patient has been faxed out due to no beds available. Patient meets [facility] inpatient criteria per [name], PA-C. Patient has been faxed out to the following facilities:  ? ?[facility]  [address] [place] [address] [phone]  ?[facility]  [address], [place] [address] [phone]  ?[facility]  [address], [place] [address] [phone]  ?[place] [place] [address] [phone]  ?[facility]  [place], [place] [address] [phone]  ?[facility]  [address], [place] [address] [phone]  ?[facility]  [address] [place] [address] [phone]  ?[facility] [address] [place] [address] [phone]  ?[facility]  [address], [place] [address] [phone]  ?[facility]  [address] [place] [address] [phone]  ? ?Mariea Clonts, MSW, LCSW-A  ?10:37 AM 06/17/2021   ?

## 2021-06-17 NOTE — ED Provider Notes (Signed)
?[facility] ?Provider Note ? ? ?CSN: [id number] ?Arrival date & time: 06/16/21  2258 ? ?  ? ?History ?Chief Complaint  ?Patient presents with  ? V70.1  ? ? ?Michael Schmitt is a 51 y.o. male. ? ?Suicidal ideation with a plan. Worsening sleep, social interactions and other risk factors. H/o suicide attempt in the past. Denies ETOH/drugs.  ? ? ? ?  ? ?Home Medications ?Prior to Admission medications   ?Medication Sig Start Date End Date Taking? Authorizing Provider  ?aspirin 81 MG EC tablet Take 1 tablet (81 mg total) by mouth daily. For heart health ?Patient not taking: No sig reported 11/21/17   Lindell Spar I, NP  ?atorvastatin (LIPITOR) 20 MG tablet Take 20 mg by mouth daily. 12/16/19   [provider]  ?baclofen (LIORESAL) 10 MG tablet Take 10 mg by mouth 3 (three) times daily. 07/06/20   [provider]  ?carvedilol (COREG) 6.25 MG tablet Take 6.25 mg by mouth 2 (two) times daily with a meal. 10/25/18 08/21/20  [provider]  ?chlorproMAZINE (THORAZINE) 200 MG tablet Take 600 mg by mouth at bedtime. ?Patient not taking: Reported on 08/21/2020 11/17/19   [provider]  ?clopidogrel (PLAVIX) 75 MG tablet Take 75 mg by mouth daily. 12/16/19   [provider]  ?desvenlafaxine (PRISTIQ) 100 MG 24 hr tablet Take 100 mg by mouth daily. ?Patient not taking: Reported on 08/21/2020 05/24/20   [provider]  ?DULoxetine (CYMBALTA) 60 MG capsule Take 60 mg by mouth every morning. ?Patient not taking: Reported on 08/21/2020 07/06/20   [provider]  ?gabapentin (NEURONTIN) 800 MG tablet Take 800 mg by mouth 3 (three) times daily. 07/06/20   [provider]  ?hydrOXYzine (VISTARIL) 100 MG capsule Take 100 mg by mouth at bedtime.    [provider]  ?INGREZZA 80 MG capsule Take 80 mg by mouth daily. 04/30/20   [provider]  ?lisinopril (ZESTRIL) 10 MG tablet Take 10 mg by mouth daily. 12/16/19   [provider]  ?metFORMIN (GLUCOPHAGE) 1000 MG tablet Take 1,000 mg by mouth 2 (two) times daily. 10/02/18   [provider]  ?mirtazapine (REMERON) 15 MG tablet Take 7.5 mg by mouth at bedtime. 07/06/20   [provider]  ?pantoprazole (PROTONIX) 40 MG tablet Take 1 tablet by mouth daily. 08/18/20   [provider]  ?QUEtiapine (SEROQUEL) 300 MG tablet Take 2 tablets (600 mg total) by mouth at bedtime. 08/24/20   Clapacs, Madie Reno, MD  ?REXULTI 1 MG TABS tablet Take 1 mg by mouth daily. 03/26/20   [provider]  ?rOPINIRole (REQUIP) 0.5 MG tablet Take 0.5 mg by mouth at bedtime. 07/07/20   [provider]  ?venlafaxine XR (EFFEXOR XR) 75 MG 24 hr capsule Take 1 capsule (75 mg total) by mouth daily. 08/24/20 08/24/21  Clapacs, Madie Reno, MD  ?   ? ?Allergies    ?Prednisone, Amoxicillin, Hydrocodone, Varenicline, Other, and Trazodone and nefazodone   ? ?Review of Systems   ?Review of Systems ? ?Physical Exam ?Updated Vital Signs ?BP 137/90 (BP Location: Right Arm)   Pulse 76   Temp 98.1 ?F (36.7 ?C) (Oral)   Resp 20   Ht 6\' 2"  (1.88 m)   Wt 93 kg   SpO2 100%   BMI 26.32 kg/m?  ?Physical Exam ?Vitals and nursing note reviewed.  ?Constitutional:   ?   Appearance: He is well-developed.  ?HENT:  ?   Head:  Normocephalic and atraumatic.  ?   Mouth/Throat:  ?   Mouth: Mucous membranes are moist.  ?   Pharynx: Oropharynx is clear.  ?Eyes:  ?   Pupils: Pupils are equal, round, and reactive to light.  ?Cardiovascular:  ?   Rate and Rhythm: Normal rate.  ?Pulmonary:  ?   Effort: Pulmonary effort is normal. No respiratory distress.  ?Abdominal:  ?   General: Abdomen is flat. There is no distension.  ?Musculoskeletal:     ?   General: No swelling or tenderness. Normal range of motion.  ?   Cervical back: Normal range of motion.  ?Skin: ?   General: Skin is warm and dry.  ?Neurological:  ?   General: No focal deficit present.  ?   Mental Status: He is alert.  ? ? ?ED Results / Procedures / Treatments    ?Labs ?(all labs ordered are listed, but only abnormal results are displayed) ?Labs Reviewed  ?COMPREHENSIVE METABOLIC PANEL - Abnormal; Notable for the following components:  ?    Result Value  ? Calcium 8.7 (*)   ? AST 12 (*)   ? All other components within normal limits  ?CBC - Abnormal; Notable for the following components:  ? RBC 3.77 (*)   ? Hemoglobin 12.1 (*)   ? HCT 38.1 (*)   ? MCV 101.1 (*)   ? All other components within normal limits  ?ETHANOL  ?SALICYLATE LEVEL  ?ACETAMINOPHEN LEVEL  ?RAPID URINE DRUG SCREEN, HOSP PERFORMED  ? ? ?EKG ?None ? ?Radiology ?No results found. ? ?Procedures ?Procedures  ? ? ?Medications Ordered in ED ?Medications - No data to display ? ?ED Course/ Medical Decision Making/ A&P ?  ?                        ?Medical Decision Making ?Amount and/or Complexity of Data Reviewed ?Labs: ordered. ? ? ?Suicidal with plan. Medically cleared for TTS consultation and ultimate recommendations. Would probably IVC if he tries to leave prior to workup/evaluation.  ? ?Final Clinical Impression(s) / ED Diagnoses ?Final diagnoses:  ?None  ? ? ?Rx / DC Orders ?ED Discharge Orders   ? ? None  ? ?  ? ? ?  ?Merrily Pew, MD ?06/17/21 0205 ? ?

## 2021-06-17 NOTE — ED Notes (Signed)
Spoke with sister Olin Hauser, updates given regarding current status. ?

## 2021-06-17 NOTE — ED Notes (Signed)
Patient has been calm all morning, became more agitated and upset after TTS screening. EDP notified ?

## 2021-06-18 LAB — CBG MONITORING, ED: Glucose-Capillary: 150 mg/dL — ABNORMAL HIGH (ref 70–99)

## 2021-06-18 MED ORDER — LORAZEPAM 1 MG PO TABS
2.0000 mg | ORAL_TABLET | Freq: Once | ORAL | Status: AC
  Administered 2021-06-18: 2 mg via ORAL
  Filled 2021-06-18: qty 2

## 2021-06-18 MED ORDER — LORAZEPAM 0.5 MG PO TABS
0.5000 mg | ORAL_TABLET | Freq: Once | ORAL | Status: AC
  Administered 2021-06-18: 0.5 mg via ORAL
  Filled 2021-06-18: qty 1

## 2021-06-18 NOTE — ED Provider Notes (Signed)
Emergency Medicine Observation Re-evaluation Note ? ?Michael Schmitt is a 51 y.o. male, seen on rounds today.  Pt initially presented to the ED for complaints of psychiatric evaluation. Pt resting comfortably. No new c/o this Am.  ? ?Physical Exam  ?BP 111/76 (BP Location: Right Arm)   Pulse 79   Temp 98.4 ?F (36.9 ?C) (Oral)   Resp 20   Ht 1.88 m (6\' 2" )   Wt 93 kg   SpO2 95%   BMI 26.32 kg/m?  ?Physical Exam ?General: resting.  ?Cardiac: regular rate. ?Lungs: breathing comfortably. ?Psych: calm, resting.  ? ?ED Course / MDM  ? ? ?I have reviewed the labs performed to date as well as medications administered while in observation.  Recent changes in the last 24 hours include ED obs, med management, BH reassessment.  ? ?Plan  ?[facility] team indicates patient accepted to [facility] in transfer, Dr Selinda Flavin: ?BHH/BMU LCSW Progress Note ?  ?06/17/2021    11:38 AM ?  ?Michael Schmitt  ?  ?[id number]  ?  ?Type of Contact and Topic:  Psychiatric Bed Placement  ?  ?Pt accepted to [facility]    ?  ?Patient meets inpatient criteria per [name], PA-C ?  ?The attending provider will be Jonelle Sports, MD  ?  ?Call report to [phone] or [phone] ?  ?Burundi Welch, RN @ [facility] ED notified.    ?  ?Pt scheduled  to arrive at [place]. ? ? ? ? ?Pt currently appears stable for transfer/transport per [facility] plan.  ? ? ?  ?Lajean Saver, MD ?06/18/21 [phone] ? ?

## 2021-06-18 NOTE — ED Notes (Signed)
Pt departs ED via Safe Transport to [facility]. VSS. Belongings given to transporter  ?

## 2021-06-18 NOTE — ED Notes (Signed)
Pt states he is getting anxious. This RN spoke with Dr. Roderic Palau. Order for ativan placed ? ?

## 2021-06-18 NOTE — Discharge Instructions (Addendum)
Transfer to [place] 

## 2021-07-22 ENCOUNTER — Emergency Department
Admission: EM | Admit: 2021-07-22 | Discharge: 2021-07-23 | Disposition: A | Discharge status: Home / Self Care | Payer: Medicare Other | Attending: Emergency Medicine | Admitting: Emergency Medicine

## 2021-07-22 ENCOUNTER — Other Ambulatory Visit: Payer: Self-pay

## 2021-07-22 DIAGNOSIS — F331 Major depressive disorder, recurrent, moderate: Secondary | ICD-10-CM | POA: Diagnosis not present

## 2021-07-22 DIAGNOSIS — F1994 Other psychoactive substance use, unspecified with psychoactive substance-induced mood disorder: Secondary | ICD-10-CM | POA: Diagnosis not present

## 2021-07-22 DIAGNOSIS — R45851 Suicidal ideations: Secondary | ICD-10-CM | POA: Insufficient documentation

## 2021-07-22 DIAGNOSIS — Z046 Encounter for general psychiatric examination, requested by authority: Secondary | ICD-10-CM | POA: Diagnosis present

## 2021-07-22 DIAGNOSIS — Z20822 Contact with and (suspected) exposure to covid-19: Secondary | ICD-10-CM | POA: Insufficient documentation

## 2021-07-22 DIAGNOSIS — F419 Anxiety disorder, unspecified: Secondary | ICD-10-CM

## 2021-07-22 LAB — URINE DRUG SCREEN, QUALITATIVE (ARMC ONLY)
Amphetamines, Ur Screen: NOT DETECTED
Barbiturates, Ur Screen: NOT DETECTED
Benzodiazepine, Ur Scrn: NOT DETECTED
Cannabinoid 50 Ng, Ur ~~LOC~~: NOT DETECTED
Cocaine Metabolite,Ur ~~LOC~~: NOT DETECTED
MDMA (Ecstasy)Ur Screen: NOT DETECTED
Methadone Scn, Ur: NOT DETECTED
Opiate, Ur Screen: NOT DETECTED
Phencyclidine (PCP) Ur S: NOT DETECTED
Tricyclic, Ur Screen: POSITIVE — AB

## 2021-07-22 LAB — CBC WITH DIFFERENTIAL/PLATELET
Abs Immature Granulocytes: 0.07 10*3/uL (ref 0.00–0.07)
Basophils Absolute: 0.1 10*3/uL (ref 0.0–0.1)
Basophils Relative: 0 %
Eosinophils Absolute: 0.1 10*3/uL (ref 0.0–0.5)
Eosinophils Relative: 1 %
HCT: 42.3 % (ref 39.0–52.0)
Hemoglobin: 13.5 g/dL (ref 13.0–17.0)
Immature Granulocytes: 1 %
Lymphocytes Relative: 16 %
Lymphs Abs: 1.9 10*3/uL (ref 0.7–4.0)
MCH: 30.3 pg (ref 26.0–34.0)
MCHC: 31.9 g/dL (ref 30.0–36.0)
MCV: 94.8 fL (ref 80.0–100.0)
Monocytes Absolute: 0.6 10*3/uL (ref 0.1–1.0)
Monocytes Relative: 5 %
Neutro Abs: 8.9 10*3/uL — ABNORMAL HIGH (ref 1.7–7.7)
Neutrophils Relative %: 77 %
Platelets: 261 10*3/uL (ref 150–400)
RBC: 4.46 MIL/uL (ref 4.22–5.81)
RDW: 13.6 % (ref 11.5–15.5)
WBC: 11.7 10*3/uL — ABNORMAL HIGH (ref 4.0–10.5)
nRBC: 0 % (ref 0.0–0.2)

## 2021-07-22 LAB — COMPREHENSIVE METABOLIC PANEL
ALT: 13 U/L (ref 0–44)
AST: 17 U/L (ref 15–41)
Albumin: 4.5 g/dL (ref 3.5–5.0)
Alkaline Phosphatase: 78 U/L (ref 38–126)
Anion gap: 8 (ref 5–15)
BUN: 19 mg/dL (ref 6–20)
CO2: 24 mmol/L (ref 22–32)
Calcium: 9.2 mg/dL (ref 8.9–10.3)
Chloride: 104 mmol/L (ref 98–111)
Creatinine, Ser: 1.07 mg/dL (ref 0.61–1.24)
GFR, Estimated: >60 mL/min (ref >60)
Glucose, Bld: 133 mg/dL — ABNORMAL HIGH (ref 70–99)
Potassium: 4.2 mmol/L (ref 3.5–5.1)
Sodium: 136 mmol/L (ref 135–145)
Total Bilirubin: 0.5 mg/dL (ref 0.3–1.2)
Total Protein: 7.4 g/dL (ref 6.5–8.1)

## 2021-07-22 LAB — RESP PANEL BY RT-PCR (FLU A&B, COVID) ARPGX2
Influenza A by PCR: NEGATIVE
Influenza B by PCR: NEGATIVE
SARS Coronavirus 2 by RT PCR: NEGATIVE

## 2021-07-22 LAB — ETHANOL: Alcohol, Ethyl (B): <10 mg/dL (ref <10)

## 2021-07-22 MED ORDER — ATORVASTATIN CALCIUM 20 MG PO TABS
20.0000 mg | ORAL_TABLET | Freq: Every day | ORAL | Status: DC
  Administered 2021-07-22 – 2021-07-23 (×2): 20 mg via ORAL
  Filled 2021-07-22 (×2): qty 1

## 2021-07-22 MED ORDER — MIRTAZAPINE 15 MG PO TABS
7.5000 mg | ORAL_TABLET | Freq: Every day | ORAL | Status: DC
  Administered 2021-07-22: 7.5 mg via ORAL
  Filled 2021-07-22: qty 1

## 2021-07-22 MED ORDER — QUETIAPINE FUMARATE 300 MG PO TABS: 600.0000 mg | ORAL_TABLET | Freq: Every day | ORAL | Status: DC

## 2021-07-22 MED ORDER — METFORMIN HCL 500 MG PO TABS: 1000.0000 mg | ORAL_TABLET | Freq: Two times a day (BID) | ORAL | Status: DC

## 2021-07-22 MED ORDER — PROPRANOLOL HCL 10 MG PO TABS
10.0000 mg | ORAL_TABLET | Freq: Two times a day (BID) | ORAL | Status: DC
  Administered 2021-07-22 – 2021-07-23 (×2): 10 mg via ORAL
  Filled 2021-07-22 (×2): qty 1

## 2021-07-22 MED ORDER — ESCITALOPRAM OXALATE 10 MG PO TABS
10.0000 mg | ORAL_TABLET | Freq: Every day | ORAL | Status: DC
  Administered 2021-07-22 – 2021-07-23 (×2): 10 mg via ORAL
  Filled 2021-07-22 (×2): qty 1

## 2021-07-22 MED ORDER — METFORMIN HCL 500 MG PO TABS
500.0000 mg | ORAL_TABLET | Freq: Two times a day (BID) | ORAL | Status: DC
  Administered 2021-07-23: 500 mg via ORAL
  Filled 2021-07-22: qty 1

## 2021-07-22 MED ORDER — PANTOPRAZOLE SODIUM 40 MG PO TBEC
40.0000 mg | DELAYED_RELEASE_TABLET | Freq: Every day | ORAL | Status: DC
  Administered 2021-07-22 – 2021-07-23 (×2): 40 mg via ORAL
  Filled 2021-07-22 (×2): qty 1

## 2021-07-22 MED ORDER — HALOPERIDOL LACTATE 5 MG/ML IJ SOLN
5.0000 mg | Freq: Once | INTRAMUSCULAR | Status: AC
  Administered 2021-07-22: 5 mg via INTRAMUSCULAR
  Filled 2021-07-22: qty 1

## 2021-07-22 MED ORDER — QUETIAPINE FUMARATE 200 MG PO TABS
800.0000 mg | ORAL_TABLET | Freq: Every day | ORAL | Status: DC
  Administered 2021-07-22: 800 mg via ORAL
  Filled 2021-07-22: qty 1

## 2021-07-22 MED ORDER — NALTREXONE HCL 50 MG PO TABS
50.0000 mg | ORAL_TABLET | Freq: Every day | ORAL | Status: DC
  Administered 2021-07-22 – 2021-07-23 (×2): 50 mg via ORAL
  Filled 2021-07-22 (×2): qty 1

## 2021-07-22 MED ORDER — DIPHENHYDRAMINE HCL 50 MG/ML IJ SOLN
50.0000 mg | Freq: Once | INTRAMUSCULAR | Status: AC
  Administered 2021-07-22: 50 mg via INTRAMUSCULAR
  Filled 2021-07-22: qty 1

## 2021-07-22 MED ORDER — CARVEDILOL 3.125 MG PO TABS
6.2500 mg | ORAL_TABLET | Freq: Two times a day (BID) | ORAL | Status: DC
  Administered 2021-07-22 – 2021-07-23 (×2): 6.25 mg via ORAL
  Filled 2021-07-22: qty 1
  Filled 2021-07-22: qty 2

## 2021-07-22 MED ORDER — CHLORPROMAZINE HCL 25 MG PO TABS
25.0000 mg | ORAL_TABLET | Freq: Three times a day (TID) | ORAL | Status: DC
  Administered 2021-07-22 – 2021-07-23 (×2): 25 mg via ORAL
  Filled 2021-07-22 (×2): qty 1

## 2021-07-22 MED ORDER — CLOPIDOGREL BISULFATE 75 MG PO TABS
75.0000 mg | ORAL_TABLET | Freq: Every day | ORAL | Status: DC
  Administered 2021-07-22 – 2021-07-23 (×2): 75 mg via ORAL
  Filled 2021-07-22 (×2): qty 1

## 2021-07-22 NOTE — ED Notes (Signed)
Spoke with pharm tech who stated he'll come to complete medrec soon.  ?

## 2021-07-22 NOTE — ED Notes (Signed)
IVC re-evaluate in am ?

## 2021-07-22 NOTE — Consult Note (Signed)
[place] Psychiatry Consult   Reason for Consult:  depression, suicidal ideations Referring Physician:  EDP Patient Identification: Michael Schmitt MRN:  [id number] Principal Diagnosis: Major depressive disorder, recurrent, moderate Diagnosis:  Major depressive disorder, recurrent, moderate   Total Time spent with patient: 45 minutes  Subjective:   Michael Schmitt is a 51 y.o. male patient admitted with depression and anxiety.  "Ativan is the only thing that helps me."  HPI:  51 yo male well-known to the ED for substance abuse, anxiety, depression.  Today he reports his depression is moderate, chronic suicidal ideations with no plan or intent.  He was at [facility] earlier this month and follows up with [facility].  He was there earlier today and got upset with threats to self harm, calm in the ED with no threats.  He requests Ativan as it "is the only thing that helps me."  Politely explained that would not be ordered and provided alternatives, he decided on propranolol and agreeable to naltrexone as he could not afford his Campral for alcohol cravings.  No homicidal ideations, hallucinations, or recent substance abuse-per client, labs pending.    Past Psychiatric History: substance abuse, depression, anxiety  Risk to Self:  none Risk to Others:  none Prior Inpatient Therapy:  multiple Prior Outpatient Therapy:  [facility]  Past Medical History:  Past Medical History:  Diagnosis Date   Anxiety    Anxiety    Arthritis    cerv. stenosis, spondylosis, HNP- lower back , has been followed in pain clinic, has  had injection s in cerv. 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 Cervical three-four.   CARDIAC CATHETERIZATION N/A 01/01/2015   Procedure: Left Heart Cath and Coronary Angiography;  Surgeon: [name], MD;  Location: [place] [facility];  Service: Cardiovascular;  Laterality: N/A;   CARDIAC CATHETERIZATION N/A 04/05/2016   Procedure: Left Heart Cath and Coronary Angiography;  Surgeon: Belva Crome, MD;  Location: [place] CV LAB;  Service: Cardiovascular;  Laterality: N/A;   CARDIAC CATHETERIZATION N/A 04/05/2016   Procedure: Coronary Stent Intervention;  Surgeon: Belva Crome, MD;  Location: [place] CV LAB;  Service: Cardiovascular;  Laterality: N/A;   CARDIAC CATHETERIZATION N/A 04/05/2016   Procedure: Intravascular Ultrasound/IVUS;  Surgeon: Belva Crome, MD;  Location: [place] CV LAB;  Service: Cardiovascular;  Laterality: N/A;   NASAL SINUS SURGERY     2005   Family History:  Family  History  Problem Relation Age of Onset   Prostate cancer Father    Hypertension Mother    Kidney Stones Mother    Anxiety disorder Mother    Depression Mother    COPD Sister    Hypertension Sister    Diabetes Sister    Depression Sister    Anxiety disorder Sister    Seizures Sister    ADD / ADHD Son    ADD / ADHD Daughter    Family Psychiatric  History: see above Social History:  Social History    Substance and Sexual Activity  Alcohol Use No   Alcohol/week: 0.0 standard drinks   Comment: last drinked in 3 months.      Social History   Substance and Sexual Activity  Drug Use No    Social History   Socioeconomic History   Marital status: Single    Spouse name: Not on file   Number of children: 3   Years of education: 12   Highest education level: Not on file  Occupational History    Comment: unemployed  Tobacco Use   Smoking status: Every Day    Packs/day: 2.00    Years: 17.00    Pack years: 34.00    Types: Cigarettes, Cigars   Smokeless tobacco: Former   Tobacco comments:    occassional snuff   Vaping Use   Vaping Use: Never used  Substance and Sexual Activity   Alcohol use: No    Alcohol/week: 0.0 standard drinks    Comment: last drinked in 3 months.    Drug use: No   Sexual activity: Not Currently  Other Topics Concern   Not on file  Social History Narrative   Patient is right handed.   Patient drinks 2 sodas daily.   Social Determinants of Health   Financial Resource Strain: Not on file  Food Insecurity: Not on file  Transportation Needs: Not on file  Physical Activity: Not on file  Stress: Not on file  Social Connections: Not on file   Additional Social History:    Allergies:   Allergies  Allergen Reactions   Prednisone Other (See Comments)    Hypertension and makes him feel "spacey"   Amoxicillin Other (See Comments)    Other Reaction: ELEVATED BP   Hydrocodone Other (See Comments)    Causes headaches and makes the patient irritable   Varenicline Other (See Comments)    Suicidal thoughts   Other Anxiety    Other reaction(s): Other (See Comments) Patient stated that this keeps him awake; "has an opposite effect on me"   Trazodone And Nefazodone Anxiety and Other (See Comments)    Patient stated that this keeps him awake; "has an opposite effect on me"    Labs:  Results for orders placed or performed during the hospital encounter  of 07/22/21 (from the past 48 hour(s))  Comprehensive metabolic panel     Status: Abnormal   Collection Time: 07/22/21  4:05 PM  Result Value Ref Range   Sodium 136 135 - 145 mmol/L   Potassium 4.2 3.5 - 5.1 mmol/L   Chloride 104 98 - 111 mmol/L   CO2 24 22 - 32 mmol/L   Glucose, Bld 133 (H) 70 - 99 mg/dL    Comment: Glucose reference range applies only to samples taken after fasting for at least 8 hours.   BUN 19 6 - 20 mg/dL   Creatinine, Ser 1.07 0.61 - 1.24 mg/dL   Calcium 9.2 8.9 - 10.3 mg/dL  Total Protein 7.4 6.5 - 8.1 g/dL   Albumin 4.5 3.5 - 5.0 g/dL   AST 17 15 - 41 U/L   ALT 13 0 - 44 U/L   Alkaline Phosphatase 78 38 - 126 U/L   Total Bilirubin 0.5 0.3 - 1.2 mg/dL   GFR, Estimated >60 >60 mL/min    Comment: (NOTE) Calculated using the CKD-EPI Creatinine Equation (2021)    Anion gap 8 5 - 15    Comment: Performed at [facility], [address] [place], [place] [address]  Urine Drug Screen, Qualitative     Status: Abnormal   Collection Time: 07/22/21  4:05 PM  Result Value Ref Range   Tricyclic, Ur Screen POSITIVE (A) NONE DETECTED   Amphetamines, Ur Screen NONE DETECTED NONE DETECTED   MDMA (Ecstasy)Ur Screen NONE DETECTED NONE DETECTED   Cocaine Metabolite,Ur Crane NONE DETECTED NONE DETECTED   Opiate, Ur Screen NONE DETECTED NONE DETECTED   Phencyclidine (PCP) Ur S NONE DETECTED NONE DETECTED   Cannabinoid 50 Ng, Ur  NONE DETECTED NONE DETECTED   Barbiturates, Ur Screen NONE DETECTED NONE DETECTED   Benzodiazepine, Ur Scrn NONE DETECTED NONE DETECTED   Methadone Scn, Ur NONE DETECTED NONE DETECTED    Comment: (NOTE) Tricyclics + metabolites, urine    Cutoff 1000 ng/mL Amphetamines + metabolites, urine  Cutoff 1000 ng/mL MDMA (Ecstasy), urine              Cutoff 500 ng/mL Cocaine Metabolite, urine          Cutoff 300 ng/mL Opiate + metabolites, urine        Cutoff 300 ng/mL Phencyclidine (PCP), urine         Cutoff 25 ng/mL Cannabinoid, urine                  Cutoff 50 ng/mL Barbiturates + metabolites, urine  Cutoff 200 ng/mL Benzodiazepine, urine              Cutoff 200 ng/mL Methadone, urine                   Cutoff 300 ng/mL  The urine drug screen provides only a preliminary, unconfirmed analytical test result and should not be used for non-medical purposes. Clinical consideration and professional judgment should be applied to any positive drug screen result due to possible interfering substances. A more specific alternate chemical method must be used in order to obtain a confirmed analytical result. Gas chromatography / mass spectrometry (GC/MS) is the preferred confirm atory method. Performed at [facility], [address] [place], [place] [address]   CBC with Diff     Status: Abnormal   Collection Time: 07/22/21  4:05 PM  Result Value Ref Range   WBC 11.7 (H) 4.0 - 10.5 K/uL   RBC 4.46 4.22 - 5.81 MIL/uL   Hemoglobin 13.5 13.0 - 17.0 g/dL   HCT 42.3 39.0 - 52.0 %   MCV 94.8 80.0 - 100.0 fL   MCH 30.3 26.0 - 34.0 pg   MCHC 31.9 30.0 - 36.0 g/dL   RDW 13.6 11.5 - 15.5 %   Platelets 261 150 - 400 K/uL   nRBC 0.0 0.0 - 0.2 %   Neutrophils Relative % 77 %   Neutro Abs 8.9 (H) 1.7 - 7.7 K/uL   Lymphocytes Relative 16 %   Lymphs Abs 1.9 0.7 - 4.0 K/uL   Monocytes Relative 5 %   Monocytes Absolute 0.6 0.1 - 1.0 K/uL  Eosinophils Relative 1 %   Eosinophils Absolute 0.1 0.0 - 0.5 K/uL   Basophils Relative 0 %   Basophils Absolute 0.1 0.0 - 0.1 K/uL   Immature Granulocytes 1 %   Abs Immature Granulocytes 0.07 0.00 - 0.07 K/uL    Comment: Performed at [facility], [address] [place], [place] [address]    Current Facility-Administered Medications  Medication Dose Route Frequency Provider Last Rate Last Admin   mirtazapine (REMERON) tablet 7.5 mg  7.5 mg Oral QHS Patrecia Pour, NP       naltrexone (DEPADE) tablet 50 mg  50 mg Oral Daily Patrecia Pour, NP       propranolol (INDERAL) tablet  10 mg  10 mg Oral BID Patrecia Pour, NP       QUEtiapine (SEROQUEL) tablet 600 mg  600 mg Oral QHS Patrecia Pour, NP       Current Outpatient Medications  Medication Sig Dispense Refill   aspirin 81 MG EC tablet Take 1 tablet (81 mg total) by mouth daily. For heart health (Patient not taking: No sig reported) 30 tablet 1   atorvastatin (LIPITOR) 20 MG tablet Take 20 mg by mouth daily.     baclofen (LIORESAL) 10 MG tablet Take 10 mg by mouth 3 (three) times daily.     carvedilol (COREG) 6.25 MG tablet Take 6.25 mg by mouth 2 (two) times daily with a meal.     chlorproMAZINE (THORAZINE) 200 MG tablet Take 600 mg by mouth at bedtime. (Patient not taking: Reported on 08/21/2020)     clopidogrel (PLAVIX) 75 MG tablet Take 75 mg by mouth daily.     desvenlafaxine (PRISTIQ) 100 MG 24 hr tablet Take 100 mg by mouth daily. (Patient not taking: Reported on 08/21/2020)     DULoxetine (CYMBALTA) 60 MG capsule Take 60 mg by mouth every morning. (Patient not taking: Reported on 08/21/2020)     gabapentin (NEURONTIN) 800 MG tablet Take 800 mg by mouth 3 (three) times daily. (Patient not taking: Reported on 06/17/2021)     hydrOXYzine (VISTARIL) 100 MG capsule Take 100 mg by mouth at bedtime. (Patient not taking: Reported on 06/17/2021)     INGREZZA 80 MG capsule Take 80 mg by mouth daily. (Patient not taking: Reported on 06/17/2021)     lisinopril (ZESTRIL) 10 MG tablet Take 10 mg by mouth daily. (Patient not taking: Reported on 06/17/2021)     metFORMIN (GLUCOPHAGE) 1000 MG tablet Take 1,000 mg by mouth 2 (two) times daily.     mirtazapine (REMERON) 15 MG tablet Take 7.5 mg by mouth at bedtime.     pantoprazole (PROTONIX) 40 MG tablet Take 1 tablet by mouth daily.     QUEtiapine (SEROQUEL) 300 MG tablet Take 2 tablets (600 mg total) by mouth at bedtime. 60 tablet 1   REXULTI 1 MG TABS tablet Take 1 mg by mouth daily. (Patient not taking: Reported on 06/17/2021)     rOPINIRole (REQUIP) 0.5 MG tablet Take 0.5 mg  by mouth at bedtime. (Patient not taking: Reported on 06/17/2021)     venlafaxine XR (EFFEXOR XR) 75 MG 24 hr capsule Take 1 capsule (75 mg total) by mouth daily. 30 capsule 1    Musculoskeletal: Strength & Muscle Tone: within normal limits Gait & Station: normal Patient leans: N/A  Psychiatric Specialty Exam: Physical Exam Vitals and nursing note reviewed.  Constitutional:      Appearance: Normal appearance.  HENT:     Head:  Normocephalic.     Nose: Nose normal.  Pulmonary:     Effort: Pulmonary effort is normal.  Musculoskeletal:        General: Normal range of motion.     Cervical back: Normal range of motion.  Neurological:     General: No focal deficit present.     Mental Status: He is alert and oriented to person, place, and time.  Psychiatric:        Attention and Perception: Attention and perception normal.        Mood and Affect: Mood is anxious and depressed.        Speech: Speech normal.        Behavior: Behavior normal. Behavior is cooperative.        Thought Content: Thought content normal.        Cognition and Memory: Cognition and memory normal.        Judgment: Judgment normal.    Review of Systems  Musculoskeletal:  Positive for back pain.  Psychiatric/Behavioral:  Positive for depression. The patient is nervous/anxious.   All other systems reviewed and are negative.  Blood pressure (!) 156/112, pulse 97, temperature 97.9 F (36.6 C), temperature source Oral, resp. rate 20, height 6\' 2"  (1.88 m), weight 90.3 kg, SpO2 97 %.Body mass index is 25.55 kg/m.  General Appearance: Casual  Eye Contact:  Good  Speech:  Normal Rate  Volume:  Normal  Mood:  Anxious and Depressed  Affect:  Blunt  Thought Process:  Coherent and Descriptions of Associations: Intact  Orientation:  Full (Time, Place, and Person)  Thought Content:  WDL and Logical  Suicidal Thoughts:  No  Homicidal Thoughts:  No  Memory:  Immediate;   Good Recent;   Good Remote;   Good  Judgement:   Fair  Insight:  Fair  Psychomotor Activity:  Normal  Concentration:  Concentration: Good and Attention Span: Good  Recall:  Good  Fund of Knowledge:  Fair  Language:  Good  Akathisia:  No  Handed:  Right  AIMS (if indicated):     Assets:  Housing Leisure Time Physical Health Resilience Social Support  ADL's:  Intact  Cognition:  WNL  Sleep:        Physical Exam: Physical Exam Vitals and nursing note reviewed.  Constitutional:      Appearance: Normal appearance.  HENT:     Head: Normocephalic.     Nose: Nose normal.  Pulmonary:     Effort: Pulmonary effort is normal.  Musculoskeletal:        General: Normal range of motion.     Cervical back: Normal range of motion.  Neurological:     General: No focal deficit present.     Mental Status: He is alert and oriented to person, place, and time.  Psychiatric:        Attention and Perception: Attention and perception normal.        Mood and Affect: Mood is anxious and depressed.        Speech: Speech normal.        Behavior: Behavior normal. Behavior is cooperative.        Thought Content: Thought content normal.        Cognition and Memory: Cognition and memory normal.        Judgment: Judgment normal.   Review of Systems  Musculoskeletal:  Positive for back pain.  Psychiatric/Behavioral:  Positive for depression. The patient is nervous/anxious.   All other systems reviewed and are  negative. Blood pressure (!) 156/112, pulse 97, temperature 97.9 F (36.6 C), temperature source Oral, resp. rate 20, height 6\' 2"  (1.88 m), weight 90.3 kg, SpO2 97 %. Body mass index is 25.55 kg/m.  Treatment Plan Summary: Major depressive disorder, recurrent, moderate: Started Lexapro 10 mg daily  Alcohol cravings: Started naltrexone 50 mg daily  General anxiety disorder: Started propranolol 10 mg BID Restarted thorazine at 25 mg TID, notes do not reflect him taking this medication, restarted at the starting  dose  Insomnia: Restarted Seroquel 800 mg daily at bedtime Restarted Remeron 7.5 mg daily at bedtime  Disposition: Patient does not meet criteria for psychiatric inpatient admission. Discharge client in the am if he remains stable with no side effects from new medications  Waylan Boga, NP 07/22/2021 5:10 PM

## 2021-07-22 NOTE — ED Notes (Signed)
Pharm tech at bedside completing medrec.  ?

## 2021-07-22 NOTE — ED Notes (Signed)
Pt is pacing back and forth in his room and occasionally yelling "help".  ?

## 2021-07-22 NOTE — ED Notes (Addendum)
Pt received snack and soda.  ?

## 2021-07-22 NOTE — ED Notes (Signed)
Messaged pharm about verifying pt's meds and pt anxious to receive his scheduled meds.  ?

## 2021-07-22 NOTE — ED Notes (Signed)
Messaged pharm again in relation to pt's meds as he is anxious to receive them.  ?

## 2021-07-22 NOTE — ED Triage Notes (Signed)
Pt BI [facility]PD from [facility] for suicidal thoughts. Pt was at [facility] screaming, saying threatening things towards himself while at [facility]. Pt has hx of the same. Pt endorses suicidal thoughts. Pt has had thoughts of hitting himself in the head with a hammer.  ?

## 2021-07-22 NOTE — ED Notes (Signed)
Pt. Transferred to [facility] from ED to room 3 after screening for contraband. Report to include Situation, Background, Assessment and Recommendations from [facility]. Pt. Oriented to unit including Q15 minute rounds as well as the security cameras for their protection. Patient is alert and oriented, warm and dry in no acute distress. Patient denied SI, HI, and AVH. Pt. Encouraged to let me know if needs arise. ? ?

## 2021-07-22 NOTE — ED Notes (Signed)
Will provide pt with all of his scheduled meds once verified and received by pharm.  ?

## 2021-07-22 NOTE — ED Provider Notes (Signed)
? ?[facility] ?Provider Note ? ? ? Event Date/Time  ? First MD Initiated Contact with Patient 07/22/21 1627   ?  (approximate) ? ? ?History  ? ?IVC ? ? ?HPI ? ?Michael Schmitt is a 51 y.o. male who comes in from [place] for suicidal thoughts.  Patient was screaming and states that he thinks he wants to hurt himself. Patient history of the same.  Patient had a plan of hitting himself in the head with a hammer patient denies actually making any attempts.  He does deny alcohol, drugs.  Does report feeling anxious and requesting Ativan.  Denies any other medical concerns. ? ? ? ?Physical Exam  ? ?Triage Vital Signs: ?ED Triage Vitals  ?Enc Vitals Group  ?   BP 07/22/21 1602 (!) 156/112  ?   Pulse Rate 07/22/21 1602 97  ?   Resp 07/22/21 1602 20  ?   Temp 07/22/21 1602 97.9 ?F (36.6 ?C)  ?   Temp Source 07/22/21 1602 Oral  ?   SpO2 07/22/21 1602 97 %  ?   Weight 07/22/21 1600 199 lb (90.3 kg)  ?   Height 07/22/21 1600 6\' 2"  (1.88 m)  ?   Head Circumference --   ?   Peak Flow --   ?   Pain Score 07/22/21 1559 8  ?   Pain Loc --   ?   Pain Edu? --   ?   Excl. in [place]? --   ? ? ?Most recent vital signs: ?Vitals:  ? 07/22/21 1602  ?BP: (!) 156/112  ?Pulse: 97  ?Resp: 20  ?Temp: 97.9 ?F (36.6 ?C)  ?SpO2: 97%  ? ? ? ?General: Awake, no distress.  ?CV:  Good peripheral perfusion.  ?Resp:  Normal effort.  ?Abd:  No distention.  ?Other:  Patient reporting SI ? ? ?ED Results / Procedures / Treatments  ? ?Labs ?(all labs ordered are listed, but only abnormal results are displayed) ?Labs Reviewed  ?RESP PANEL BY RT-PCR (FLU A&B, COVID) ARPGX2  ?COMPREHENSIVE METABOLIC PANEL  ?ETHANOL  ?URINE DRUG SCREEN, QUALITATIVE ([facility] ONLY)  ?CBC WITH DIFFERENTIAL/PLATELET  ? ? ?MEDICATIONS ORDERED IN ED: ?Medications - No data to display ? ? ?IMPRESSION / MDM / ASSESSMENT AND PLAN / ED COURSE  ?I reviewed the triage vital signs and the nursing notes. ? ?Pt is without any acute medical complaints. No exam findings to  suggest medical cause of current presentation. Will order psychiatric screening labs and discuss further w/ psychiatric service. ? ?D/d includes but is not limited to psychiatric disease, behavioral/personality disorder, inadequate socioeconomic support, medical. ? ?Based on HPI, exam, unremarkable labs, no concern for acute medical problem at this time. No rigidity, clonus, hyperthermia, focal neurologic deficit, diaphoresis, tachycardia, meningismus, ataxia, gait abnormality or other finding to suggest this visit represents a non-psychiatric problem. Screening labs reviewed.   ? ?Given this, pt medically cleared, to be dispositioned per Psych. ? ?CBC slightly elevated white count but no other infectious symptoms.  CBC reassuring.  Tylenol negative. ? ?The patient has been placed in psychiatric observation due to the need to provide a safe environment for the patient while obtaining psychiatric consultation and evaluation, as well as ongoing medical and medication management to treat the patient's condition.  The patient has been placed under full IVC at this time.  ? ? ? ? ? ?The patient is on the cardiac monitor to evaluate for evidence of arrhythmia and/or significant heart rate changes. ? ?  ? ? ?  FINAL CLINICAL IMPRESSION(S) / ED DIAGNOSES  ? ?Final diagnoses:  ?Suicide ideation  ? ? ? ?Rx / DC Orders  ? ?ED Discharge Orders   ? ? None  ? ?  ? ? ? ?Note:  This document was prepared using Dragon voice recognition software and may include unintentional dictation errors. ?  ?Vanessa Terral, MD ?07/22/21 1708 ? ?

## 2021-07-22 NOTE — ED Notes (Signed)
Pt given beverage 

## 2021-07-23 DIAGNOSIS — F331 Major depressive disorder, recurrent, moderate: Secondary | ICD-10-CM | POA: Diagnosis not present

## 2021-07-23 DIAGNOSIS — F1994 Other psychoactive substance use, unspecified with psychoactive substance-induced mood disorder: Secondary | ICD-10-CM

## 2021-07-23 MED ORDER — QUETIAPINE FUMARATE 400 MG PO TABS: 800.0000 mg | ORAL_TABLET | Freq: Every day | ORAL | 2 refills | Status: DC

## 2021-07-23 MED ORDER — PROPRANOLOL HCL 10 MG PO TABS: 10.0000 mg | ORAL_TABLET | Freq: Two times a day (BID) | ORAL | 2 refills | Status: DC

## 2021-07-23 MED ORDER — NALTREXONE HCL 50 MG PO TABS: 50.0000 mg | ORAL_TABLET | Freq: Every day | ORAL | 2 refills | Status: DC

## 2021-07-23 MED ORDER — MIRTAZAPINE 7.5 MG PO TABS: 7.5000 mg | ORAL_TABLET | Freq: Every day | ORAL | 2 refills | Status: DC

## 2021-07-23 MED ORDER — CHLORPROMAZINE HCL 25 MG PO TABS: 25.0000 mg | ORAL_TABLET | Freq: Three times a day (TID) | ORAL | 2 refills | Status: DC

## 2021-07-23 MED ORDER — HYDROXYZINE HCL 25 MG PO TABS
50.0000 mg | ORAL_TABLET | Freq: Four times a day (QID) | ORAL | Status: DC | PRN
Start: 2021-07-23 — End: 2021-07-23
  Administered 2021-07-23: 50 mg via ORAL
  Filled 2021-07-23: qty 2

## 2021-07-23 MED ORDER — ESCITALOPRAM OXALATE 10 MG PO TABS: 10.0000 mg | ORAL_TABLET | Freq: Every day | ORAL | 2 refills | Status: DC

## 2021-07-23 MED ORDER — ONDANSETRON 4 MG PO TBDP
4.0000 mg | ORAL_TABLET | Freq: Once | ORAL | Status: AC
  Administered 2021-07-23: 4 mg via ORAL
  Filled 2021-07-23: qty 1

## 2021-07-23 NOTE — ED Notes (Signed)
Pt pacing on unit and yelling "help."   ?

## 2021-07-23 NOTE — ED Notes (Signed)
VOL/pending reassessment in the AM 

## 2021-07-23 NOTE — ED Notes (Signed)
Pt. Received breakfast with a drink. ?

## 2021-07-23 NOTE — ED Provider Notes (Signed)
----------------------------------------- ?  9:59 AM on 07/23/2021 ?----------------------------------------- ?We have no psychiatry personnel in the emergency department today.  I read psychiatry note from yesterday they believe the patient could be discharged as long as he is stable on his medications.  I spoke to the patient and informed him that there is no psychiatry is here today and the patient does wish to go home.  We will restart the patient's medications as described in his psychiatry note.  Patient has repeatedly requested Ativan for anxiety.  We will hold off on any benzodiazepines.  I informed the patient of this.  I will send the patient's prescriptions to [facility] as requested by the patient and we will discharge home. ?  ?Harvest Dark, MD ?07/23/21 1000 ? ?

## 2021-07-23 NOTE — ED Notes (Signed)
Medications given early per patient request to help with anxiety.  ?

## 2021-07-23 NOTE — ED Notes (Signed)
Pt discharged home.  VS stable. All belongings (wallet, cell phone) returned to patient. Pt denies SI. ?

## 2021-07-23 NOTE — ED Notes (Signed)
PRN medication given for anxiety.   ?

## 2021-09-22 ENCOUNTER — Ambulatory Visit: Payer: Medicare Other | Attending: Neurology

## 2021-09-30 ENCOUNTER — Encounter (HOSPITAL_COMMUNITY): Payer: Self-pay | Admitting: Emergency Medicine

## 2021-09-30 ENCOUNTER — Ambulatory Visit: Payer: Medicare Other | Attending: Neurology

## 2021-09-30 ENCOUNTER — Other Ambulatory Visit: Payer: Self-pay

## 2021-09-30 ENCOUNTER — Emergency Department (HOSPITAL_COMMUNITY)
Admission: EM | Admit: 2021-09-30 | Discharge: 2021-10-02 | Disposition: A | Discharge status: Home / Self Care | Payer: Medicare Other | Attending: Emergency Medicine | Admitting: Emergency Medicine

## 2021-09-30 DIAGNOSIS — F332 Major depressive disorder, recurrent severe without psychotic features: Secondary | ICD-10-CM | POA: Insufficient documentation

## 2021-09-30 DIAGNOSIS — Z20822 Contact with and (suspected) exposure to covid-19: Secondary | ICD-10-CM | POA: Diagnosis not present

## 2021-09-30 DIAGNOSIS — R7309 Other abnormal glucose: Secondary | ICD-10-CM | POA: Diagnosis not present

## 2021-09-30 DIAGNOSIS — Z7982 Long term (current) use of aspirin: Secondary | ICD-10-CM | POA: Diagnosis not present

## 2021-09-30 DIAGNOSIS — M542 Cervicalgia: Secondary | ICD-10-CM | POA: Insufficient documentation

## 2021-09-30 DIAGNOSIS — Z7902 Long term (current) use of antithrombotics/antiplatelets: Secondary | ICD-10-CM | POA: Insufficient documentation

## 2021-09-30 DIAGNOSIS — Z79899 Other long term (current) drug therapy: Secondary | ICD-10-CM | POA: Diagnosis not present

## 2021-09-30 DIAGNOSIS — R45851 Suicidal ideations: Secondary | ICD-10-CM | POA: Insufficient documentation

## 2021-09-30 HISTORY — DX: Bipolar disorder, unspecified: F31.9

## 2021-09-30 LAB — CBC
HCT: 37 % — ABNORMAL LOW (ref 39.0–52.0)
Hemoglobin: 11.6 g/dL — ABNORMAL LOW (ref 13.0–17.0)
MCH: 32 pg (ref 26.0–34.0)
MCHC: 31.4 g/dL (ref 30.0–36.0)
MCV: 102.2 fL — ABNORMAL HIGH (ref 80.0–100.0)
Platelets: 229 10*3/uL (ref 150–400)
RBC: 3.62 MIL/uL — ABNORMAL LOW (ref 4.22–5.81)
RDW: 14.5 % (ref 11.5–15.5)
WBC: 6.8 10*3/uL (ref 4.0–10.5)
nRBC: 0 % (ref 0.0–0.2)

## 2021-09-30 LAB — COMPREHENSIVE METABOLIC PANEL
ALT: 15 U/L (ref 0–44)
AST: 15 U/L (ref 15–41)
Albumin: 3.3 g/dL — ABNORMAL LOW (ref 3.5–5.0)
Alkaline Phosphatase: 66 U/L (ref 38–126)
Anion gap: 9 (ref 5–15)
BUN: 14 mg/dL (ref 6–20)
CO2: 20 mmol/L — ABNORMAL LOW (ref 22–32)
Calcium: 8.7 mg/dL — ABNORMAL LOW (ref 8.9–10.3)
Chloride: 106 mmol/L (ref 98–111)
Creatinine, Ser: 1.17 mg/dL (ref 0.61–1.24)
GFR, Estimated: >60 mL/min (ref >60)
Glucose, Bld: 262 mg/dL — ABNORMAL HIGH (ref 70–99)
Potassium: 4.7 mmol/L (ref 3.5–5.1)
Sodium: 135 mmol/L (ref 135–145)
Total Bilirubin: 0.2 mg/dL — ABNORMAL LOW (ref 0.3–1.2)
Total Protein: 6.9 g/dL (ref 6.5–8.1)

## 2021-09-30 LAB — RAPID URINE DRUG SCREEN, HOSP PERFORMED
Amphetamines: NOT DETECTED
Barbiturates: NOT DETECTED
Benzodiazepines: POSITIVE — AB
Cocaine: NOT DETECTED
Opiates: NOT DETECTED
Tetrahydrocannabinol: NOT DETECTED

## 2021-09-30 LAB — SALICYLATE LEVEL: Salicylate Lvl: 9.5 mg/dL (ref 7.0–30.0)

## 2021-09-30 LAB — ETHANOL: Alcohol, Ethyl (B): <10 mg/dL (ref <10)

## 2021-09-30 LAB — ACETAMINOPHEN LEVEL: Acetaminophen (Tylenol), Serum: <10 ug/mL — ABNORMAL LOW (ref 10–30)

## 2021-09-30 MED ORDER — ALBUTEROL SULFATE (2.5 MG/3ML) 0.083% IN NEBU
2.5000 mg | INHALATION_SOLUTION | Freq: Four times a day (QID) | RESPIRATORY_TRACT | Status: DC | PRN
Start: 2021-09-30 — End: 2021-10-02
  Administered 2021-10-01: 2.5 mg via RESPIRATORY_TRACT
  Filled 2021-09-30: qty 3

## 2021-09-30 MED ORDER — PANTOPRAZOLE SODIUM 40 MG PO TBEC
40.0000 mg | DELAYED_RELEASE_TABLET | Freq: Every day | ORAL | Status: DC
  Administered 2021-09-30 – 2021-10-01 (×2): 40 mg via ORAL
  Filled 2021-09-30 (×2): qty 1

## 2021-09-30 MED ORDER — ALPRAZOLAM 0.5 MG PO TABS
1.0000 mg | ORAL_TABLET | Freq: Once | ORAL | Status: AC
  Administered 2021-09-30: 1 mg via ORAL
  Filled 2021-09-30: qty 2

## 2021-09-30 MED ORDER — DIVALPROEX SODIUM ER 500 MG PO TB24
500.0000 mg | ORAL_TABLET | Freq: Every day | ORAL | Status: DC
  Administered 2021-10-01: 500 mg via ORAL
  Filled 2021-09-30: qty 1

## 2021-09-30 MED ORDER — METFORMIN HCL 500 MG PO TABS
1000.0000 mg | ORAL_TABLET | Freq: Two times a day (BID) | ORAL | Status: DC
  Administered 2021-10-01 (×2): 1000 mg via ORAL
  Filled 2021-09-30 (×2): qty 2

## 2021-09-30 MED ORDER — CARVEDILOL 3.125 MG PO TABS
6.2500 mg | ORAL_TABLET | Freq: Two times a day (BID) | ORAL | Status: DC
  Administered 2021-10-01 (×2): 6.25 mg via ORAL
  Filled 2021-09-30 (×2): qty 2

## 2021-09-30 MED ORDER — GLIPIZIDE ER 5 MG PO TB24
10.0000 mg | ORAL_TABLET | Freq: Every day | ORAL | Status: DC
  Administered 2021-10-01: 10 mg via ORAL
  Filled 2021-09-30: qty 2

## 2021-09-30 MED ORDER — METFORMIN HCL 500 MG PO TABS: 1000.0000 mg | ORAL_TABLET | Freq: Two times a day (BID) | ORAL | Status: DC

## 2021-09-30 MED ORDER — ATORVASTATIN CALCIUM 10 MG PO TABS
20.0000 mg | ORAL_TABLET | Freq: Every day | ORAL | Status: DC
  Administered 2021-09-30 – 2021-10-01 (×2): 20 mg via ORAL
  Filled 2021-09-30 (×2): qty 2

## 2021-09-30 MED ORDER — QUETIAPINE FUMARATE 100 MG PO TABS
800.0000 mg | ORAL_TABLET | Freq: Every day | ORAL | Status: DC
  Administered 2021-09-30 – 2021-10-01 (×2): 800 mg via ORAL
  Filled 2021-09-30 (×2): qty 8

## 2021-09-30 MED ORDER — ACETAMINOPHEN 325 MG PO TABS
650.0000 mg | ORAL_TABLET | Freq: Once | ORAL | Status: AC
  Administered 2021-09-30: 650 mg via ORAL
  Filled 2021-09-30: qty 2

## 2021-09-30 MED ORDER — DOXEPIN HCL 25 MG PO CAPS
25.0000 mg | ORAL_CAPSULE | Freq: Every day | ORAL | Status: DC
Start: 2021-09-30 — End: 2021-10-02
  Administered 2021-09-30 – 2021-10-01 (×2): 25 mg via ORAL
  Filled 2021-09-30 (×5): qty 1

## 2021-09-30 MED ORDER — DIVALPROEX SODIUM ER 500 MG PO TB24
2000.0000 mg | ORAL_TABLET | Freq: Every day | ORAL | Status: DC
  Administered 2021-09-30: 2000 mg via ORAL
  Filled 2021-09-30: qty 4

## 2021-09-30 MED ORDER — DIVALPROEX SODIUM ER 500 MG PO TB24
1500.0000 mg | ORAL_TABLET | Freq: Every day | ORAL | Status: DC
  Administered 2021-10-01: 1500 mg via ORAL
  Filled 2021-09-30: qty 3

## 2021-09-30 MED ORDER — ESCITALOPRAM OXALATE 10 MG PO TABS
10.0000 mg | ORAL_TABLET | Freq: Every day | ORAL | Status: DC
  Administered 2021-09-30 – 2021-10-01 (×2): 10 mg via ORAL
  Filled 2021-09-30 (×2): qty 1

## 2021-09-30 MED ORDER — ALBUTEROL SULFATE HFA 108 (90 BASE) MCG/ACT IN AERS: 1.0000 | INHALATION_SPRAY | Freq: Four times a day (QID) | RESPIRATORY_TRACT | Status: DC | PRN

## 2021-09-30 NOTE — ED Notes (Signed)
Provided pt with a [place] sandwich, chips and soda pop.

## 2021-09-30 NOTE — ED Provider Notes (Signed)
[facility] EMERGENCY DEPARTMENT Provider Note   CSN: [id number] Arrival date & time: 09/30/21  1315     History Chief Complaint  Patient presents with   Suicidal   Neck Pain    Michael Schmitt is a 51 y.o. male with history of severe depression and anxiety who presents to the emergency department today for further evaluation of suicidal ideations.  Patient states she suffers from suicidal ideations off and on for several years but this particular episode got worse over the last couple of weeks.  Patient has no specific plan at this time and when asked she does states that he would do it however the way he can.  He has no homicidal ideations.  He has been taking all his medications and has not missed any doses.  His only complaint today is neck pain which is been going on since 2013.  He does have history of cervical fusion.  Denies any other somatic complaint at this time.   Neck Pain      Home Medications Prior to Admission medications   Medication Sig Start Date End Date Taking? Authorizing Provider  acamprosate (CAMPRAL) 333 MG tablet Take 666 mg by mouth 3 (three) times daily with meals.    [provider]  albuterol (VENTOLIN HFA) 108 (90 Base) MCG/ACT inhaler Inhale into the lungs every 6 (six) hours as needed for wheezing or shortness of breath.    [provider]  aspirin 81 MG EC tablet Take 1 tablet (81 mg total) by mouth daily. For heart health Patient not taking: Reported on 08/21/2020 11/21/17   Lindell Spar I, NP  atorvastatin (LIPITOR) 20 MG tablet Take 20 mg by mouth daily. 12/16/19   [provider]  carvedilol (COREG) 6.25 MG tablet Take 6.25 mg by mouth 2 (two) times daily with a meal. 10/25/18 07/22/21  [provider]  chlorproMAZINE (THORAZINE) 25 MG tablet Take 1 tablet (25 mg total) by mouth 3 (three) times daily. 07/23/21   Harvest Dark, MD  clopidogrel (PLAVIX) 75 MG tablet Take 75 mg by mouth daily. 12/16/19    [provider]  divalproex (DEPAKOTE ER) 500 MG 24 hr tablet Take 2,000 mg by mouth daily. Take 500mg  in the morning Take 1,500mg  at bedtime    [provider]  doxepin (SINEQUAN) 25 MG capsule Take 25 mg by mouth.    [provider]  escitalopram (LEXAPRO) 10 MG tablet Take 1 tablet (10 mg total) by mouth daily. 07/23/21 10/21/21  Harvest Dark, MD  glipiZIDE (GLUCOTROL XL) 10 MG 24 hr tablet Take 10 mg by mouth daily with breakfast.    [provider]  haloperidol (HALDOL) 2 MG tablet Take 2 mg by mouth 2 (two) times daily.    [provider]  meloxicam (MOBIC) 7.5 MG tablet Take 7.5 mg by mouth daily.    [provider]  metFORMIN (GLUCOPHAGE) 500 MG tablet Take 1,000 mg by mouth 2 (two) times daily. 10/02/18   [provider]  mirtazapine (REMERON) 7.5 MG tablet Take 1 tablet (7.5 mg total) by mouth at bedtime. 07/23/21   Harvest Dark, MD  naltrexone (DEPADE) 50 MG tablet Take 1 tablet (50 mg total) by mouth daily. 07/23/21   Harvest Dark, MD  pantoprazole (PROTONIX) 40 MG tablet Take 1 tablet by mouth daily. 08/18/20   [provider]  propranolol (INDERAL) 10 MG tablet Take 1 tablet (10 mg total) by mouth 2 (two) times daily. 07/23/21   Harvest Dark,  MD  QUEtiapine (SEROQUEL) 400 MG tablet Take 2 tablets (800 mg total) by mouth at bedtime. 07/23/21   Harvest Dark, MD      Allergies    Prednisone, Amoxicillin, Hydrocodone, Varenicline, Other, and Trazodone and nefazodone    Review of Systems   Review of Systems  Musculoskeletal:  Positive for neck pain.  All other systems reviewed and are negative.   Physical Exam Updated Vital Signs BP (!) 143/93 (BP Location: Right Arm)   Pulse 81   Temp 98.1 F (36.7 C) (Oral)   Resp 18   SpO2 98%  Physical Exam Vitals and nursing note reviewed.  Constitutional:      General: He is not in acute distress.    Appearance: Normal appearance.  HENT:      Head: Normocephalic and atraumatic.  Eyes:     General:        Right eye: No discharge.        Left eye: No discharge.  Cardiovascular:     Comments: Regular rate and rhythm.  S1/S2 are distinct without any evidence of murmur, rubs, or gallops.  Radial pulses are 2+ bilaterally.  Dorsalis pedis pulses are 2+ bilaterally.  No evidence of pedal edema. Pulmonary:     Comments: Clear to auscultation bilaterally.  Normal effort.  No respiratory distress.  No evidence of wheezes, rales, or rhonchi heard throughout. Abdominal:     General: Abdomen is flat. Bowel sounds are normal. There is no distension.     Tenderness: There is no abdominal tenderness. There is no guarding or rebound.  Musculoskeletal:        General: Normal range of motion.     Cervical back: Neck supple.  Skin:    General: Skin is warm and dry.     Findings: No rash.  Neurological:     General: No focal deficit present.     Mental Status: He is alert.  Psychiatric:        Mood and Affect: Mood normal.        Behavior: Behavior normal.     ED Results / Procedures / Treatments   Labs (all labs ordered are listed, but only abnormal results are displayed) Labs Reviewed  COMPREHENSIVE METABOLIC PANEL  ETHANOL  SALICYLATE LEVEL  ACETAMINOPHEN LEVEL  CBC  RAPID URINE DRUG SCREEN, HOSP PERFORMED    EKG None  Radiology No results found.  Procedures Procedures    Medications Ordered in ED Medications - No data to display  ED Course/ Medical Decision Making/ A&P Clinical Course as of 09/30/21 1807  Fri Sep 30, 2021  1803 cbc(!) No evidence of leukocytosis and there is mild evidence of anemia in comparison to previous results. [CF]  1804 Comprehensive metabolic panel(!) CMP reveals elevated glucose and lower bicarb but otherwise normal. [CF]  0932 Salicylate level Within normal limits. [CF]  1804 Acetaminophen level(!) Negative. [CF]  6712 Rapid urine drug screen (hospital performed)(!) Positive for  benzodiazepines. [CF]  1804 Ethanol Negative. [CF]    Clinical Course User Index [CF] Hendricks Limes, PA-C                           Medical Decision Making Michael Schmitt is a 51 y.o. male patient who presents to the emerged from today for further evaluation of suicidal ideations.  Patient has been seen evaluated numerous times for this in the past.  Patient seems to express chronic suicidality and chronic depression.  Patient states has been taking all of his medications.  We will medically clear him and have him talk with psychiatry.  Do not feel that IVC would be of benefit at this time considering that patient has been seen evaluated recently for similar symptoms and ultimately psych cleared.  Patient will be voluntary but he is compliant.   Amount and/or Complexity of Data Reviewed Labs: ordered. Decision-making details documented in ED Course. ECG/medicine tests: ordered and independent interpretation performed.  Risk Prescription drug management. Risk Details: Patient medically cleared.  Labs highlighted in ED course.  I plugged in his home medications.  Disposition to be made by oncoming provider pending psychiatric evaluation and recommendations.   Final Clinical Impression(s) / ED Diagnoses Final diagnoses:  None    Rx / DC Orders ED Discharge Orders     None         Cherrie Gauze 09/30/21 Lillie Columbia, MD 09/30/21 [phone]

## 2021-09-30 NOTE — ED Triage Notes (Signed)
Pt c/o being suicidal "for years" every day, worse today, c/o back of neck hurting "for years" every day as well. When asked if he had a plan to kill self pt states "whatever I can". Pt cooperative/calm. Nad. C/o being homicidal with no certain person in mind. Denies avh.

## 2021-09-30 NOTE — ED Notes (Signed)
Pt dressed out into burg scrubs and has been wanded by security.

## 2021-10-01 DIAGNOSIS — F332 Major depressive disorder, recurrent severe without psychotic features: Secondary | ICD-10-CM | POA: Diagnosis not present

## 2021-10-01 LAB — SARS CORONAVIRUS 2 BY RT PCR: SARS Coronavirus 2 by RT PCR: NEGATIVE

## 2021-10-01 MED ORDER — LORAZEPAM 1 MG PO TABS
1.0000 mg | ORAL_TABLET | Freq: Once | ORAL | Status: AC
  Administered 2021-10-01: 1 mg via ORAL
  Filled 2021-10-01: qty 1

## 2021-10-01 MED ORDER — ZIPRASIDONE MESYLATE 20 MG IM SOLR
10.0000 mg | Freq: Once | INTRAMUSCULAR | Status: AC | PRN
  Administered 2021-10-01: 10 mg via INTRAMUSCULAR
  Filled 2021-10-01: qty 20

## 2021-10-01 MED ORDER — ACETAMINOPHEN 325 MG PO TABS
650.0000 mg | ORAL_TABLET | Freq: Four times a day (QID) | ORAL | Status: DC | PRN
  Administered 2021-10-01: 650 mg via ORAL
  Filled 2021-10-01: qty 2

## 2021-10-01 MED ORDER — HYDROXYZINE HCL 25 MG PO TABS
50.0000 mg | ORAL_TABLET | Freq: Four times a day (QID) | ORAL | Status: DC | PRN
  Administered 2021-10-01: 50 mg via ORAL
  Filled 2021-10-01: qty 2

## 2021-10-01 MED ORDER — ZIPRASIDONE MESYLATE 20 MG IM SOLR: 20.0000 mg | INTRAMUSCULAR | Status: DC | PRN

## 2021-10-01 MED ORDER — OLANZAPINE 5 MG PO TBDP
10.0000 mg | ORAL_TABLET | Freq: Three times a day (TID) | ORAL | Status: DC | PRN
  Administered 2021-10-01: 10 mg via ORAL
  Filled 2021-10-01: qty 2

## 2021-10-01 MED ORDER — LORAZEPAM 1 MG PO TABS: 1.0000 mg | ORAL_TABLET | ORAL | Status: DC | PRN

## 2021-10-01 NOTE — ED Notes (Signed)
Pt still hitting himself in the head, progressively getting more aggressive as well. Both sitters present attempting to redirect pt with no resolution.

## 2021-10-01 NOTE — ED Notes (Signed)
Pt provided with blank paper and crayons to occupy mind upon request to 'ease pt anxiety'

## 2021-10-01 NOTE — ED Notes (Signed)
Pt. In room stating he is scared, wants to kill himself. NT sitter talking to patient right now.

## 2021-10-01 NOTE — ED Notes (Signed)
Pt constantly yelling HELP. When asked what pt needs pt just mumbling incoherently.

## 2021-10-01 NOTE — BH Assessment (Signed)
Comprehensive Clinical Assessment (CCA) Note  10/01/2021 Michael Schmitt [id number]  DISPOSITION: Gave clinical report to Quintella Reichert, NP who determined Pt meets criteria for inpatient psychiatric treatment. Appropriate facilities will be contacted for placement. Notified Dr Joseph Berkshire and Carron Curie, RN of recommendation via secure message.  The patient demonstrates the following risk factors for suicide: Chronic risk factors for suicide include: psychiatric disorder of major depressive disorder and previous suicide attempts by hanging himself . Acute risk factors for suicide include: social withdrawal/isolation and recent discharge from inpatient psychiatry. Protective factors for this patient include: positive therapeutic relationship and responsibility to others (children, family). Considering these factors, the overall suicide risk at this point appears to be high. Patient is not appropriate for outpatient follow up.  [facility] from 09/30/2021 in [place] [facility] from 07/22/2021 in [facility] from 06/16/2021 in [facility] High Risk High Risk High Risk      Pt is a 51 year old male who presents unaccompanied to [name] ED reporting suicidal ideation with plan to hang himself. He is minimally cooperative and repeatedly says, "I just need help." He has a diagnosis of major depressive disorder and personality disorder. He reports he has attempted suicide in the past by putting a loaded gun to his head and pulling the trigger but the gun did not fire. He says he has also attempted to hang himself in the past. He reports thoughts of harming others with no specific victim or plan. Pt describes his mood as depressed and  Pt acknowledges symptoms including social withdrawal, loss of interest in usual pleasures, fatigue, irritability, decreased concentration, decreased sleep, and  feelings of hopelessness. Medical record indicates a history of aggressive behavior but not recently. He denies auditory or visual hallucinations.   Pt denies recent substance use. Pt's medical record indicates he has a history of alcohol abuse and a history of benzodiazepine dependence. In the past, he has been on high doses of benzodiazepines with excessive use as well as a history of alcohol use in the past. Has a history of presentation frequently to the hospital asking for more sedative medicines.  Pt describe his mental health symptoms as his primary stressor. He says he lives with his mother, sister, and Pt's two children, ages 67 and 52. He is on disability due to mental health and neck problems. He denies history of abuse. He reports he is currently on probation. He denies access to firearms.   Pt is currently receiving ACTT services through [facility]. He says he spoke with them recently but says they did not help and he still wants to kill himself. He states he is taking medications as prescribed. He has been psychiatrically hospitalized several times in the past, most recently at [facility] in March 2023.  Pt is dressed in hospital scrubs, drowsy, and oriented x4. Pt speaks in a mumbled tone, at moderate volume and normal pace. Motor behavior appears normal. Eye contact is minimal. Pt's mood is depressed and affect is congruent with mood. Thought process is coherent and relevant. There is no indication Pt is currently responding to internal stimuli or experiencing delusional thought content. He is requesting inpatient psychiatric treatment.  Chief Complaint:  Chief Complaint  Patient presents with   Suicidal   Neck Pain   Visit Diagnosis: F33.2 Major depressive disorder, Recurrent episode, Severe   CCA Screening, Triage and Referral (STR)  Patient Reported Information How did  you hear about [place]? Self  What Is the Reason for Your Visit/Call Today? Pt has a diagnosis of major depressive  disorder and personality disorder. He report current suicidal ideation with plan to hang himself. He also reports thoughts of harming others with no identified victim or plan. He is unable to contract for safety at this time.  How Long Has This Been Causing You Problems? > than 6 months  What Do You Feel Would Help You the Most Today? Treatment for Depression or other mood problem; Medication(s)   Have You Recently Had Any Thoughts About Hurting Yourself? Yes  Are You Planning to Commit Suicide/Harm Yourself At This time? Yes   Have you Recently Had Thoughts About Hurting Someone [name]? Yes  Are You Planning to Harm Someone at This Time? No  Explanation: No data recorded  Have You Used Any Alcohol or Drugs in the Past 24 Hours? No  How Long Ago Did You Use Drugs or Alcohol? No data recorded What Did You Use and How Much? No data recorded  Do You Currently Have a Therapist/Psychiatrist? Yes  Name of Therapist/Psychiatrist: ACTT services through [facility].   Have You Been Recently Discharged From Any Office Practice or Programs? Yes  Explanation of Discharge From Practice/Program: Discharged from [facility] in March 2023     CCA Screening Triage Referral Assessment Type of Contact: Tele-Assessment  Telemedicine Service Delivery: Telemedicine service delivery: This service was provided via telemedicine using a 2-way, interactive audio and video technology  Is this Initial or Reassessment? Initial Assessment  Date Telepsych consult ordered in [facility]:  09/30/21  Time Telepsych consult ordered in [facility]:  1810  Location of Assessment: [facility]  Provider Location: [facility] Assessment Services   Collateral Involvement: None   Does Patient Have a Rosamond? No data recorded Name and Contact of Legal Guardian: No data recorded If Minor and Not Living with Parent(s), Who has Custody? NA  Is CPS involved or ever been involved? Never  Is APS involved or ever been  involved? Never   Patient Determined To Be At Risk for Harm To Self or Others Based on Review of Patient Reported Information or Presenting Complaint? Yes, for Self-Harm  Method: No data recorded Availability of Means: No data recorded Intent: No data recorded Notification Required: No data recorded Additional Information for Danger to Others Potential: No data recorded Additional Comments for Danger to Others Potential: No data recorded Are There Guns or Other Weapons in Your Home? No data recorded Types of Guns/Weapons: No data recorded Are These Weapons Safely Secured?                            No data recorded Who Could Verify You Are Able To Have These Secured: No data recorded Do You Have any Outstanding Charges, Pending Court Dates, Parole/Probation? No data recorded Contacted To Inform of Risk of Harm To Self or Others: Unable to Contact:    Does Patient Present under Involuntary Commitment? No  IVC Papers Initial File Date: No data recorded  [place] of Residence: [place]   Patient Currently Receiving the Following Services: ACTT Architect); Medication Management   Determination of Need: Emergent (2 hours)   Options For Referral: Inpatient Hospitalization; Outpatient Therapy; Medication Management     CCA Biopsychosocial Patient Reported Schizophrenia/Schizoaffective Diagnosis in Past: No   Strengths: Family supports; stable housing; has fair insight   Mental Health Symptoms Depression:  Change in energy/activity; Difficulty Concentrating; Hopelessness; Worthlessness; Irritability; Fatigue; Sleep (too much or little)   Duration of Depressive symptoms:  Duration of Depressive Symptoms: Greater than two weeks   Mania:   None   Anxiety:    Difficulty concentrating; Irritability; Restlessness; Tension; Worrying   Psychosis:   None   Duration of Psychotic symptoms:    Trauma:   None   Obsessions:   None   Compulsions:    None   Inattention:   None   Hyperactivity/Impulsivity:   None   Oppositional/Defiant Behaviors:   Easily annoyed   Emotional Irregularity:   Recurrent suicidal behaviors/gestures/threats; Potentially harmful impulsivity   Other Mood/Personality Symptoms:   Pt's medical record indicates diagnosis of personality disorder    Mental Status Exam Appearance and self-care  Stature:   Tall   Weight:   Average weight   Clothing:   -- (Scrubs)   Grooming:   Normal   Cosmetic use:   None   Posture/gait:   Normal   Motor activity:   Not Remarkable   Sensorium  Attention:   Normal   Concentration:   Normal   Orientation:   X5   Recall/memory:   Normal   Affect and Mood  Affect:   Depressed; Negative   Mood:   Depressed; Irritable; Negative   Relating  Eye contact:   Fleeting   Facial expression:   Depressed   Attitude toward examiner:   Cooperative; Irritable   Thought and Language  Speech flow:  Other (Comment) ([place])   Thought content:   Appropriate to Mood and Circumstances   Preoccupation:   None   Hallucinations:   None   Organization:  No data recorded  [employer] of Knowledge:   Average   Intelligence:   Average   Abstraction:   Normal   Judgement:   Fair   Reality Testing:   Adequate   Insight:   Fair   Decision Making:   Impulsive   Social Functioning  Social Maturity:   Impulsive   Social Judgement:   Heedless   Stress  Stressors:   Other (Comment) (Pt cannot identify stressors)   Coping Ability:   Exhausted   Skill Deficits:   Decision making   Supports:   Family; Friends/Service system     Religion: Religion/Spirituality Are You A Religious Person?: Yes What is Your Religious Affiliation?: Christian How Might This Affect Treatment?: NA  Leisure/Recreation: Leisure / Recreation Do You Have Hobbies?: No  Exercise/Diet: Exercise/Diet Do You Exercise?: Yes What  Type of Exercise Do You Do?: Run/Walk How Many Times a Week Do You Exercise?: 1-3 times a week Have You Gained or Lost A Significant Amount of Weight in the Past Six Months?: No Do You Follow a Special Diet?: No Do You Have Any Trouble Sleeping?: No   CCA Employment/Education Employment/Work Situation: Employment / Work Situation Employment Situation: On disability Why is Patient on Disability: Per pt, "neck pain and depression." How Long has Patient Been on Disability: Since 2015. Patient's Job has Been Impacted by Current Illness: Yes Describe how Patient's Job has Been Impacted: Pt is unable to work.  Has Patient ever Been in the [employer]?: No  Education: Education Is Patient Currently Attending School?: No Last Grade Completed: 11 Did You Attend College?: No Did You Have An Individualized Education Program (IIEP): No Did You Have Any Difficulty At School?: No Patient's Education Has Been Impacted by Current Illness: No   CCA Family/Childhood History Family  and Relationship History: Family history Does patient have children?: Yes How many children?: 3 How is patient's relationship with their children?: Pt reports, two of his children live with him and his sister and that he has a good relationship with his children.  Childhood History:  Childhood History By whom was/is the patient raised?: Both parents Did patient suffer any verbal/emotional/physical/sexual abuse as a child?: No Did patient suffer from severe childhood neglect?: No Has patient ever been sexually abused/assaulted/raped as an adolescent or adult?: No Was the patient ever a victim of a crime or a disaster?: No Witnessed domestic violence?: No Has patient been affected by domestic violence as an adult?: No  Child/Adolescent Assessment:     CCA Substance Use Alcohol/Drug Use: Alcohol / Drug Use Pain Medications: See MAR Prescriptions: See MAR Over the Counter: See MAR History of alcohol / drug  use?: Yes (Pt has history of substance abuse in the past.) Longest period of sobriety (when/how long): Reports of no current use                         ASAM's:  Six Dimensions of Multidimensional Assessment  Dimension 1:  Acute Intoxication and/or Withdrawal Potential:      Dimension 2:  Biomedical Conditions and Complications:      Dimension 3:  Emotional, Behavioral, or Cognitive Conditions and Complications:     Dimension 4:  Readiness to Change:     Dimension 5:  Relapse, Continued use, or Continued Problem Potential:     Dimension 6:  Recovery/Living Environment:     ASAM Severity Score:    ASAM Recommended Level of Treatment:     Substance use Disorder (SUD)    Recommendations for Services/Supports/Treatments:    Discharge Disposition: Discharge Disposition Medical Exam completed: Yes  DSM5 Diagnoses: Patient Active Problem List   Diagnosis Date Noted   Drug-seeking behavior 02/25/2021   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   Hyponatremia 01/24/2019   Substance induced mood disorder ([place]) 11/22/2018   Alcohol abuse with alcohol-induced mood disorder ([place]) 09/21/2018   Personality disorder ([place]) 12/31/2017   Major depressive disorder, recurrent episode, moderate ([place]) 03/10/2017   Generalized anxiety disorder 02/05/2017   Chronic neck pain (Primary Area of Pain) (Left) 11/22/2016   Long term prescription benzodiazepine use 11/21/2016   Opiate use 11/21/2016   Type 2 diabetes mellitus without complication, without long-term current use of insulin ([place]) 10/19/2016   Chronic Suicidal ideation 09/15/2016   Alcohol use disorder, severe, 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

## 2021-10-01 NOTE — ED Notes (Signed)
Pt screaming out "help" ask what wrong Pt. Does not answer question, pt beating self in top of head, redirected to stop this behavior. Pt says "shut up"

## 2021-10-01 NOTE — Progress Notes (Signed)
Per Quintella Reichert, NP, patient meets criteria for inpatient treatment. There are no available beds at [facility] today. CSW faxed referrals to the following facilities for review:  [facility] [address] [place] [address] [phone] --  [facility] N/A [address], [place] [address] [phone] --  [address] [place] [address] [phone] --  [facility]  Pending - Request Sent N/A [address] [place] [address] [phone] --  [place] N/A [place], [place] [address] [phone] --  [facility] [address] [place], [place] [address] [phone] --  [place] [address] [place] [address] [phone] --  [facility]  Pending - Request Sent N/A [address], [place] [address] [address], [place] [address] [phone] --  [facility]  Pending - Request Sent N/A [address] [place] [address] [phone] --  [facility]  Pending - Request Sent N/A [address], [place] [address] [phone] --  [facility]  Pending - Request Sent N/A [place], [place] [address] [phone] --  [facility]  Pending - Request Sent N/A [address] [place] [address] [phone] --  [facility]  Pending - Request Sent N/A [address] [place] [address] [phone] --  [facility]  Pending - Request Sent N/A [address] [place] [address] [phone] --   TTS will continue to seek bed placement.  Glennie [place], MSW, [facility] [place] Phone: [phone] Disposition/TOC

## 2021-10-01 NOTE — ED Notes (Signed)
Breakfast at bedside.

## 2021-10-01 NOTE — ED Notes (Signed)
PT states he is having bad anxiety and asking for some meds.

## 2021-10-01 NOTE — Progress Notes (Signed)
Per Olivia Mackie [facility] admissions, pt has been accepted to [facility], main campus. Accepting provider is Dr. Mallie Darting. Patient can arrive 10/02/2021 after 8:00am. Number for report is [phone]) [phone] or [phone]. Please fax COVID results to [phone] before calling report.    Glennie Isle, MSW, LCSW-A Phone: [phone] Disposition/TOC

## 2021-10-01 NOTE — ED Provider Notes (Signed)
Emergency Medicine Observation Re-evaluation Note  Michael Schmitt is a 51 y.o. male, seen on rounds today.  Pt initially presented to the ED for complaints of Suicidal and Neck Pain Currently, the patient is complaining of anxiety.  Physical Exam  BP 104/62   Pulse (!) 54   Temp (!) 97.4 F (36.3 C) (Oral)   Resp 18   SpO2 97%  Physical Exam General: alert Cardiac: regular rate Lungs: breathing easily Psych: feeling anxious  ED Course / MDM  EKG:   I have reviewed the labs performed to date as well as medications administered while in observation.  Recent changes in the last 24 hours include complaining of increased anxiety..  Plan  Current plan is for we will start on hydroxyzine as needed to help with his anxiety.  Continue his other psychiatric medications, plan is for inpatient treatment.Alger Simons is not under involuntary commitment.     Dorie Rank, MD 10/01/21 [phone]

## 2021-10-02 DIAGNOSIS — F332 Major depressive disorder, recurrent severe without psychotic features: Secondary | ICD-10-CM | POA: Diagnosis not present

## 2021-10-02 NOTE — ED Provider Notes (Signed)
Emergency Medicine Observation Re-evaluation Note  Irineo Gaulin is a 51 y.o. male, seen on rounds today.  Pt initially presented to the ED for complaints of Suicidal and Neck Pain Currently, the patient is sleeping.  Physical Exam  BP 116/70 (BP Location: Right Arm)   Pulse (!) 58   Temp 98.6 F (37 C) (Oral)   Resp 17   SpO2 99%  Physical Exam General: Sleeping Cardiac: Extremities perfused Lungs: Breathing is unlabored Psych: Deferred  ED Course / MDM  EKG:   I have reviewed the labs performed to date as well as medications administered while in observation.  Recent changes in the last 24 hours include agitation and anxiety yesterday morning.  He has been accepted to [facility] with plan for transfer this morning.  Plan  Current plan is for admission to [facility].  Alger Simons is not under involuntary commitment.     Godfrey Pick, MD 10/02/21 [phone]

## 2021-10-02 NOTE — ED Notes (Signed)
Called from personal cell phone, staff answered phone and transferred call to Supervisor's VM which states that a message cannot be left. Explained to staff answering phone that nurse to nurse report must be called prior to patient leaving. Call disconnected.

## 2021-10-02 NOTE — ED Notes (Signed)
Attempted to call report multiple times to [facility] x2 numbers listed in chart without success. Upon staff answering phone once, staff gave me another number to call report which turns out to be a fax number.

## 2021-10-02 NOTE — ED Notes (Signed)
[facility] called 

## 2021-10-13 ENCOUNTER — Emergency Department: Payer: Medicare Other

## 2021-10-13 ENCOUNTER — Emergency Department
Admission: EM | Admit: 2021-10-13 | Discharge: 2021-10-14 | Disposition: A | Discharge status: Home / Self Care | Payer: Medicare Other | Attending: Emergency Medicine | Admitting: Emergency Medicine

## 2021-10-13 ENCOUNTER — Other Ambulatory Visit: Payer: Self-pay

## 2021-10-13 DIAGNOSIS — F1021 Alcohol dependence, in remission: Secondary | ICD-10-CM

## 2021-10-13 DIAGNOSIS — S50312A Abrasion of left elbow, initial encounter: Secondary | ICD-10-CM | POA: Diagnosis not present

## 2021-10-13 DIAGNOSIS — F12188 Cannabis abuse with other cannabis-induced disorder: Secondary | ICD-10-CM | POA: Diagnosis not present

## 2021-10-13 DIAGNOSIS — F17210 Nicotine dependence, cigarettes, uncomplicated: Secondary | ICD-10-CM | POA: Diagnosis not present

## 2021-10-13 DIAGNOSIS — F1090 Alcohol use, unspecified, uncomplicated: Secondary | ICD-10-CM | POA: Diagnosis not present

## 2021-10-13 DIAGNOSIS — R45850 Homicidal ideations: Secondary | ICD-10-CM | POA: Insufficient documentation

## 2021-10-13 DIAGNOSIS — R45851 Suicidal ideations: Secondary | ICD-10-CM | POA: Insufficient documentation

## 2021-10-13 DIAGNOSIS — I129 Hypertensive chronic kidney disease with stage 1 through stage 4 chronic kidney disease, or unspecified chronic kidney disease: Secondary | ICD-10-CM | POA: Insufficient documentation

## 2021-10-13 DIAGNOSIS — F609 Personality disorder, unspecified: Secondary | ICD-10-CM | POA: Diagnosis present

## 2021-10-13 DIAGNOSIS — W19XXXA Unspecified fall, initial encounter: Secondary | ICD-10-CM | POA: Diagnosis not present

## 2021-10-13 DIAGNOSIS — F331 Major depressive disorder, recurrent, moderate: Secondary | ICD-10-CM | POA: Diagnosis present

## 2021-10-13 DIAGNOSIS — F1910 Other psychoactive substance abuse, uncomplicated: Secondary | ICD-10-CM | POA: Insufficient documentation

## 2021-10-13 DIAGNOSIS — Z20822 Contact with and (suspected) exposure to covid-19: Secondary | ICD-10-CM | POA: Insufficient documentation

## 2021-10-13 DIAGNOSIS — S50311A Abrasion of right elbow, initial encounter: Secondary | ICD-10-CM | POA: Insufficient documentation

## 2021-10-13 DIAGNOSIS — F1994 Other psychoactive substance use, unspecified with psychoactive substance-induced mood disorder: Secondary | ICD-10-CM | POA: Diagnosis not present

## 2021-10-13 DIAGNOSIS — N189 Chronic kidney disease, unspecified: Secondary | ICD-10-CM | POA: Diagnosis not present

## 2021-10-13 DIAGNOSIS — I2510 Atherosclerotic heart disease of native coronary artery without angina pectoris: Secondary | ICD-10-CM | POA: Diagnosis not present

## 2021-10-13 DIAGNOSIS — S40212A Abrasion of left shoulder, initial encounter: Secondary | ICD-10-CM | POA: Diagnosis not present

## 2021-10-13 DIAGNOSIS — Z7289 Other problems related to lifestyle: Secondary | ICD-10-CM | POA: Insufficient documentation

## 2021-10-13 DIAGNOSIS — S40211A Abrasion of right shoulder, initial encounter: Secondary | ICD-10-CM | POA: Diagnosis not present

## 2021-10-13 DIAGNOSIS — Y900 Blood alcohol level of less than 20 mg/100 ml: Secondary | ICD-10-CM | POA: Diagnosis not present

## 2021-10-13 DIAGNOSIS — E1122 Type 2 diabetes mellitus with diabetic chronic kidney disease: Secondary | ICD-10-CM | POA: Insufficient documentation

## 2021-10-13 DIAGNOSIS — F411 Generalized anxiety disorder: Secondary | ICD-10-CM | POA: Diagnosis present

## 2021-10-13 DIAGNOSIS — F1310 Sedative, hypnotic or anxiolytic abuse, uncomplicated: Secondary | ICD-10-CM | POA: Diagnosis present

## 2021-10-13 DIAGNOSIS — F12988 Cannabis use, unspecified with other cannabis-induced disorder: Secondary | ICD-10-CM | POA: Diagnosis present

## 2021-10-13 DIAGNOSIS — Z23 Encounter for immunization: Secondary | ICD-10-CM | POA: Diagnosis not present

## 2021-10-13 DIAGNOSIS — F39 Unspecified mood [affective] disorder: Secondary | ICD-10-CM | POA: Diagnosis present

## 2021-10-13 DIAGNOSIS — S0091XA Abrasion of unspecified part of head, initial encounter: Secondary | ICD-10-CM | POA: Insufficient documentation

## 2021-10-13 DIAGNOSIS — S0990XA Unspecified injury of head, initial encounter: Secondary | ICD-10-CM | POA: Insufficient documentation

## 2021-10-13 DIAGNOSIS — S4991XA Unspecified injury of right shoulder and upper arm, initial encounter: Secondary | ICD-10-CM | POA: Diagnosis present

## 2021-10-13 DIAGNOSIS — Z765 Malingerer [conscious simulation]: Secondary | ICD-10-CM

## 2021-10-13 LAB — COMPREHENSIVE METABOLIC PANEL
ALT: 15 U/L (ref 0–44)
AST: 26 U/L (ref 15–41)
Albumin: 3.7 g/dL (ref 3.5–5.0)
Alkaline Phosphatase: 78 U/L (ref 38–126)
Anion gap: 7 (ref 5–15)
BUN: 14 mg/dL (ref 6–20)
CO2: 27 mmol/L (ref 22–32)
Calcium: 8.9 mg/dL (ref 8.9–10.3)
Chloride: 103 mmol/L (ref 98–111)
Creatinine, Ser: 1.16 mg/dL (ref 0.61–1.24)
GFR, Estimated: >60 mL/min (ref >60)
Glucose, Bld: 176 mg/dL — ABNORMAL HIGH (ref 70–99)
Potassium: 3.7 mmol/L (ref 3.5–5.1)
Sodium: 137 mmol/L (ref 135–145)
Total Bilirubin: 0.4 mg/dL (ref 0.3–1.2)
Total Protein: 6.9 g/dL (ref 6.5–8.1)

## 2021-10-13 LAB — CBC
HCT: 37.7 % — ABNORMAL LOW (ref 39.0–52.0)
Hemoglobin: 11.7 g/dL — ABNORMAL LOW (ref 13.0–17.0)
MCH: 31.1 pg (ref 26.0–34.0)
MCHC: 31 g/dL (ref 30.0–36.0)
MCV: 100.3 fL — ABNORMAL HIGH (ref 80.0–100.0)
Platelets: 224 10*3/uL (ref 150–400)
RBC: 3.76 MIL/uL — ABNORMAL LOW (ref 4.22–5.81)
RDW: 13.9 % (ref 11.5–15.5)
WBC: 7 10*3/uL (ref 4.0–10.5)
nRBC: 0 % (ref 0.0–0.2)

## 2021-10-13 LAB — URINE DRUG SCREEN, QUALITATIVE (ARMC ONLY)
Amphetamines, Ur Screen: NOT DETECTED
Barbiturates, Ur Screen: NOT DETECTED
Benzodiazepine, Ur Scrn: NOT DETECTED
Cannabinoid 50 Ng, Ur ~~LOC~~: NOT DETECTED
Cocaine Metabolite,Ur ~~LOC~~: NOT DETECTED
MDMA (Ecstasy)Ur Screen: NOT DETECTED
Methadone Scn, Ur: NOT DETECTED
Opiate, Ur Screen: NOT DETECTED
Phencyclidine (PCP) Ur S: NOT DETECTED
Tricyclic, Ur Screen: POSITIVE — AB

## 2021-10-13 LAB — SALICYLATE LEVEL: Salicylate Lvl: <7 mg/dL — ABNORMAL LOW (ref 7.0–30.0)

## 2021-10-13 LAB — RESP PANEL BY RT-PCR (FLU A&B, COVID) ARPGX2
Influenza A by PCR: NEGATIVE
Influenza B by PCR: NEGATIVE
SARS Coronavirus 2 by RT PCR: NEGATIVE

## 2021-10-13 LAB — ACETAMINOPHEN LEVEL: Acetaminophen (Tylenol), Serum: <10 ug/mL — ABNORMAL LOW (ref 10–30)

## 2021-10-13 LAB — VALPROIC ACID LEVEL: Valproic Acid Lvl: 49 ug/mL — ABNORMAL LOW (ref 50.0–100.0)

## 2021-10-13 LAB — ETHANOL: Alcohol, Ethyl (B): <10 mg/dL (ref <10)

## 2021-10-13 LAB — TROPONIN I (HIGH SENSITIVITY): Troponin I (High Sensitivity): 5 ng/L (ref <18)

## 2021-10-13 MED ORDER — TETANUS-DIPHTH-ACELL PERTUSSIS 5-2.5-18.5 LF-MCG/0.5 IM SUSY
0.5000 mL | PREFILLED_SYRINGE | Freq: Once | INTRAMUSCULAR | Status: AC
  Administered 2021-10-13: 0.5 mL via INTRAMUSCULAR
  Filled 2021-10-13: qty 0.5

## 2021-10-13 MED ORDER — LORAZEPAM 1 MG PO TABS
1.0000 mg | ORAL_TABLET | Freq: Once | ORAL | Status: AC
  Administered 2021-10-13: 1 mg via ORAL
  Filled 2021-10-13: qty 1

## 2021-10-13 NOTE — Consult Note (Signed)
1640: Patient is unable/unwilling to participate in evaluation. Keeps eyes closed, laying down. He will only whisper "I'm suicidal." When asked if he has a plan he says "I could hang myself with a rope." He declines further explanation. He touches back of head and has some blood on his finger He speaks. He is mumbling, sleepy, as if he is under the influence of substances. EDP made aware. Unable to adequately evaluate at this time. Patient presents to multiple hospital EDs rather frequently with similar complaints. He was transferred to outside psychiatric hospital from this ED on 10/02/21.   Sherlon Handing, PMHNP

## 2021-10-13 NOTE — ED Notes (Signed)
Patient was given PB & crackers for snack.

## 2021-10-13 NOTE — ED Triage Notes (Addendum)
BIB [facility] from home after pt called for help. Pt family reports pt had multiple syncopal episodes today. Pt does not recall any syncope today, denies injury. Pt did state that he fell this AM, scabbed abrasion to right elbow noted. Scabbing noted to top of head. Pt also reports he feels depressed and suicidal. Pt states he took an unprescribed subaxone today. VSS with EMS, CBG WNL.

## 2021-10-13 NOTE — ED Notes (Signed)
Meal provided 

## 2021-10-13 NOTE — ED Notes (Signed)
VOLUNTARY awaiting TTS/PSYCH consult 

## 2021-10-13 NOTE — BH Assessment (Signed)
Comprehensive Clinical Assessment (CCA) Note  10/13/2021 Michael Schmitt [id number]  Chief Complaint: Patient is a 51 year old male presenting to [facility] ED voluntarily. Per triage note BIB [facility] from home after pt called for help. Pt family reports pt had multiple syncopal episodes today. Pt does not recall any syncope today, denies injury. Pt did state that he fell this AM, scabbed abrasion to right elbow noted. Scabbing noted to top of head. Pt also reports he feels depressed and suicidal. Pt states he took an unprescribed subaxone today. During assessment patient appears alert and oriented x4, calm and cooperative. Patient continues to report SI with a plan "to hang myself."   Collateral information was obtained from the patient's ACT team with [place]. ACT team staff reports that patient was recently admitted to [facility] "he was fine for a couple days after he is released but then he goes back into the same behaviors." ACT team staff reports that patient presents to the ED on many occasions "he comes there and demands to get a certain type of medication and when he doesn't get it he threatens suicide." ACT team staff reports that they have no knowledge of the patient actually attempting suicide but will report it. ACT team staff reports that they will follow up with the patient tomorrow 10/14/21.  Per Psyc NP Ysidro Evert patient is psyc cleared Chief Complaint  Patient presents with   Suicidal   Loss of Consciousness   Visit Diagnosis: Depression, Personality disorder    CCA Screening, Triage and Referral (STR)  Patient Reported Information How did you hear about [place]? Self  Referral name: No data recorded Referral phone number: No data recorded  Whom do you see for routine medical problems? No data recorded Practice/Facility Name: No data recorded Practice/Facility Phone Number: No data recorded Name of Contact: No data recorded Contact Number: No data recorded Contact  Fax Number: No data recorded Prescriber Name: No data recorded Prescriber Address (if known): No data recorded  What Is the Reason for Your Visit/Call Today? Patient presents voluntarily due to depression and SI  How Long Has This Been Causing You Problems? > than 6 months  What Do You Feel Would Help You the Most Today? Treatment for Depression or other mood problem   Have You Recently Been in Any Inpatient Treatment (Hospital/Detox/Crisis Center/28-Day Program)? No data recorded Name/Location of Program/Hospital:No data recorded How Long Were You There? No data recorded When Were You Discharged? No data recorded  Have You Ever Received Services From [facility] Before? No data recorded Who Do You See at [facility]? No data recorded  Have You Recently Had Any Thoughts About Hurting Yourself? Yes  Are You Planning to Commit Suicide/Harm Yourself At This time? Yes   Have you Recently Had Thoughts About Hurting Someone [name]? No  Explanation: No data recorded  Have You Used Any Alcohol or Drugs in the Past 24 Hours? No  How Long Ago Did You Use Drugs or Alcohol? No data recorded What Did You Use and How Much? No data recorded  Do You Currently Have a Therapist/Psychiatrist? Yes  Name of Therapist/Psychiatrist: [facility] ACT team   Have You Been Recently Discharged From Any Office Practice or Programs? No  Explanation of Discharge From Practice/Program: Discharged from [facility] in March 2023     CCA Screening Triage Referral Assessment Type of Contact: Face-to-Face  Is this Initial or Reassessment? Initial Assessment  Date Telepsych consult ordered in [facility]:  09/30/21  Time Telepsych consult ordered in [facility]:  1810   Patient Reported Information Reviewed? No data recorded Patient Left Without Being Seen? No data recorded Reason for Not Completing Assessment: No data recorded  Collateral Involvement: None   Does Patient Have a Court Appointed Legal Guardian? No data  recorded Name and Contact of Legal Guardian: No data recorded If Minor and Not Living with Parent(s), Who has Custody? NA  Is CPS involved or ever been involved? Never  Is APS involved or ever been involved? Never   Patient Determined To Be At Risk for Harm To Self or Others Based on Review of Patient Reported Information or Presenting Complaint? No  Method: No data recorded Availability of Means: No data recorded Intent: No data recorded Notification Required: No data recorded Additional Information for Danger to Others Potential: No data recorded Additional Comments for Danger to Others Potential: No data recorded Are There Guns or Other Weapons in Your Home? No data recorded Types of Guns/Weapons: No data recorded Are These Weapons Safely Secured?                            No data recorded Who Could Verify You Are Able To Have These Secured: No data recorded Do You Have any Outstanding Charges, Pending Court Dates, Parole/Probation? No data recorded Contacted To Inform of Risk of Harm To Self or Others: Unable to Contact:   Location of Assessment: [facility] ED   Does Patient Present under Involuntary Commitment? No  IVC Papers Initial File Date: No data recorded  [place] of Residence: [place]   Patient Currently Receiving the Following Services: ACTT Architect)   Determination of Need: Emergent (2 hours)   Options For Referral: Inpatient Hospitalization; Outpatient Therapy; Medication Management     CCA Biopsychosocial Intake/Chief Complaint:  No data recorded Current Symptoms/Problems: No data recorded  Patient Reported Schizophrenia/Schizoaffective Diagnosis in Past: No   Strengths: Family supports; stable housing; has fair insight  Preferences: No data recorded Abilities: No data recorded  Type of Services Patient Feels are Needed: No data recorded  Initial Clinical Notes/Concerns: No data recorded  Mental Health  Symptoms Depression:   Change in energy/activity; Difficulty Concentrating; Hopelessness; Worthlessness; Irritability; Fatigue; Sleep (too much or little)   Duration of Depressive symptoms:  Greater than two weeks   Mania:   None   Anxiety:    Difficulty concentrating; Irritability; Restlessness; Tension; Worrying   Psychosis:   None   Duration of Psychotic symptoms: No data recorded  Trauma:   None   Obsessions:   None   Compulsions:   None   Inattention:   None   Hyperactivity/Impulsivity:   None   Oppositional/Defiant Behaviors:   Easily annoyed; Argumentative   Emotional Irregularity:   Recurrent suicidal behaviors/gestures/threats; Potentially harmful impulsivity   Other Mood/Personality Symptoms:   Pt's medical record indicates diagnosis of personality disorder    Mental Status Exam Appearance and self-care  Stature:   Tall   Weight:   Average weight   Clothing:   Disheveled ([place]rubs)   Grooming:   Neglected   Cosmetic use:   None   Posture/gait:   Normal   Motor activity:   Not Remarkable   Sensorium  Attention:   Normal   Concentration:   Normal   Orientation:   X5   Recall/memory:   Normal   Affect and Mood  Affect:   Depressed; Negative   Mood:   Depressed; Irritable;  Negative   Relating  Eye contact:   Fleeting   Facial expression:   Depressed   Attitude toward examiner:   Cooperative; Irritable   Thought and Language  Speech flow:  Other (Comment) ([place])   Thought content:   Appropriate to Mood and Circumstances   Preoccupation:   None   Hallucinations:   None   Organization:  No data recorded  [employer] of Knowledge:   Average   Intelligence:   Average   Abstraction:   Normal   Judgement:   Fair   Reality Testing:   Adequate   Insight:   Fair   Decision Making:   Impulsive   Social Functioning  Social Maturity:   Impulsive   Social Judgement:    Heedless   Stress  Stressors:   Other (Comment) (Pt cannot identify stressors)   Coping Ability:   Exhausted   Skill Deficits:   Decision making   Supports:   Family; Friends/Service system     Religion: Religion/Spirituality Are You A Religious Person?: Yes What is Your Religious Affiliation?: Christian How Might This Affect Treatment?: NA  Leisure/Recreation: Leisure / Recreation Do You Have Hobbies?: No  Exercise/Diet: Exercise/Diet Do You Exercise?: Yes What Type of Exercise Do You Do?: Run/Walk How Many Times a Week Do You Exercise?: 1-3 times a week Have You Gained or Lost A Significant Amount of Weight in the Past Six Months?: No Do You Follow a Special Diet?: No Do You Have Any Trouble Sleeping?: No   CCA Employment/Education Employment/Work Situation: Employment / Work Situation Employment Situation: On disability Why is Patient on Disability: Per pt, "neck pain and depression." How Long has Patient Been on Disability: Since 2015. Patient's Job has Been Impacted by Current Illness: Yes Describe how Patient's Job has Been Impacted: Pt is unable to work.  Has Patient ever Been in the [employer]?: No  Education: Education Last Grade Completed: 11 Did [place]?: No Did You Have An Individualized Education Program (IIEP): No Did You Have Any Difficulty At School?: No   CCA Family/Childhood History Family and Relationship History: Family history Marital status: Single Does patient have children?: Yes How many children?: 3 How is patient's relationship with their children?: Pt reports, two of his children live with him and his sister and that he has a good relationship with his children.  Childhood History:  Childhood History By whom was/is the patient raised?: Both parents Did patient suffer any verbal/emotional/physical/sexual abuse as a child?: No Did patient suffer from severe childhood neglect?: No Has patient ever been sexually  abused/assaulted/raped as an adolescent or adult?: No Was the patient ever a victim of a crime or a disaster?: No Witnessed domestic violence?: No Has patient been affected by domestic violence as an adult?: No  Child/Adolescent Assessment:     CCA Substance Use Alcohol/Drug Use: Alcohol / Drug Use Pain Medications: See MAR Prescriptions: See MAR Over the Counter: See MAR History of alcohol / drug use?:  (Patient has a history of substance use)                         ASAM's:  Six Dimensions of Multidimensional Assessment  Dimension 1:  Acute Intoxication and/or Withdrawal Potential:      Dimension 2:  Biomedical Conditions and Complications:      Dimension 3:  Emotional, Behavioral, or Cognitive Conditions and Complications:     Dimension 4:  Readiness to Change:  Dimension 5:  Relapse, Continued use, or Continued Problem Potential:     Dimension 6:  Recovery/Living Environment:     ASAM Severity Score:    ASAM Recommended Level of Treatment:     Substance use Disorder (SUD)    Recommendations for Services/Supports/Treatments: Recommendations for Services/Supports/Treatments Recommendations For Services/Supports/Treatments: ACCTT (Assertive Community Treatment), Medication Management  DSM5 Diagnoses: Patient Active Problem List   Diagnosis Date Noted   Drug-seeking behavior 02/25/2021   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   Hyponatremia 01/24/2019   Substance induced mood disorder ([place]) 11/22/2018   Alcohol abuse with alcohol-induced mood disorder ([place]) 09/21/2018   Personality disorder ([place]) 12/31/2017   Major depressive disorder, recurrent episode, moderate (HCC) 03/10/2017   Generalized anxiety disorder 02/05/2017   Chronic neck pain (Primary Area of Pain) (Left) 11/22/2016   Long term prescription benzodiazepine use 11/21/2016   Opiate use 11/21/2016   Type 2 diabetes mellitus without complication, without long-term current use of  insulin ([place]) 10/19/2016   Chronic Suicidal ideation 09/15/2016   Alcohol use disorder, severe, 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

## 2021-10-13 NOTE — ED Provider Notes (Signed)
[facility] Provider Note    Event Date/Time   First MD Initiated Contact with Patient 10/13/21 1349     (approximate)   History   Suicidal and Loss of Consciousness   HPI  Michael Schmitt is a 51 y.o. male  who comes in with SI and HI. He wants to kill self with rope. He does report having a fall this morning and has some scabs on his bilateral knees. He denies hitting his head. He took suboxone this morning. He reports he took it for pain. No alcohol use. He denies any passing out.  The nurse got a call from the family who reported that he had a syncopal episode today unclear if he hit his head but there was concern that patient's Suboxone that he took was not prescribed to him.  He himself however is declining and having the syncopal episodes   Physical Exam   Triage Vital Signs: Blood pressure (!) 149/79, pulse 84, temperature 98 F (36.7 C), temperature source Oral, resp. rate 16, height 6\' 2"  (1.88 m), weight 90.3 kg, SpO2 99 %.  Most recent vital signs: Vitals:   10/13/21 1409  BP: (!) 149/79  Pulse: 84  Resp: 16  Temp: 98 F (36.7 C)  SpO2: 99%     General: Awake, no distress.  CV:  Good peripheral perfusion.  Resp:  Normal effort.  Abd:  No distention.  Other:  Patient has some abrasions on his shoulders and elbows but full range of motion.  No chest wall pain.  No abdominal tenderness.  He is got some abrasions on his head which he reports was on when he fell.   ED Results / Procedures / Treatments   Labs (all labs ordered are listed, but only abnormal results are displayed) Labs Reviewed  COMPREHENSIVE METABOLIC PANEL  ETHANOL  SALICYLATE LEVEL  ACETAMINOPHEN LEVEL  CBC  URINE DRUG SCREEN, QUALITATIVE ([facility] ONLY)  TROPONIN I (HIGH SENSITIVITY)     EKG  My interpretation of EKG:  Normal sinus, no ST elevation, T wave inversion in V3, right bundle branch block  RADIOLOGY I have reviewed the CT head personally and  intrepretted and no evidence of any intracranial hemorrhage  IMPRESSION: 1. 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 This overlies the left frontal and left parietal lobes. Compared with the report from [facility] dated 09/13/2021 this does not appear significantly changed in volume. 2. Mild mass effect upon the underlying brain parenchyma with approximately 3 mm of left-to-right midline shift. Similar finding was reported on CT from 09/13/2021 from [facility] of care. 3. Chronic microvascular disease and brain atrophy. 4. No evidence for acute cervical spine fracture or subluxation. 5. Anterior wedging of the C7 vertebral body with associated sclerosis indicative of chronicity. However, superimposed acute compression fracture cannot be excluded. Of note, this finding was reported on CT from [facility] dated 08/07/2021.    PROCEDURES:  Critical Care performed: No  Procedures   MEDICATIONS ORDERED IN ED: Medications - No data to display   IMPRESSION / MDM / [place] / ED COURSE  I reviewed the triage vital signs and the nursing notes.   Patient's presentation is most consistent with acute presentation with potential threat to life or bodily function.   Pt is without any acute medical complaints. No exam findings to suggest medical cause of current presentation. Will order psychiatric screening labs and discuss further w/ psychiatric service.  For syncope  will get EKG, cardiac marker by suspect that is related to the Suboxone.  Given patient's not the best historian will get CT imaging to make sure no evidence of intercranial hemorrhage, cervical fracture.  We will update patient's tetanus for some of the abrasions.  Attempted to call patient's sister to get some more collateral information but nobody answered  CT imaging reassuring, COVID-negative.  Labs including EtOH, salicylate, Tylenol, CBC, CMP reassuring, troponin  negative.  Suspect that this fall could be most likely been more related to substances including the Suboxone therefore patient medically cleared to be disposition by psych  D/d includes but is not limited to psychiatric disease, behavioral/personality disorder, inadequate socioeconomic support, medical.  Based on HPI, exam, unremarkable labs, no concern for acute medical problem at this time. No rigidity, clonus, hyperthermia, focal neurologic deficit, diaphoresis, tachycardia, meningismus, ataxia, gait abnormality or other finding to suggest this visit represents a non-psychiatric problem. Screening labs reviewed.    Given this, pt medically cleared, to be dispositioned per Psych.    The patient has been placed in psychiatric observation due to the need to provide a safe environment for the patient while obtaining psychiatric consultation and evaluation, as well as ongoing medical and medication management to treat the patient's condition.  The patient has not been placed under full IVC at this time.    The patient is on the cardiac monitor to evaluate for evidence of arrhythmia and/or significant heart rate changes.      FINAL CLINICAL IMPRESSION(S) / ED DIAGNOSES   Final diagnoses:  None     Rx / DC Orders   ED Discharge Orders     None        Note:  This document was prepared using Dragon voice recognition software and may include unintentional dictation errors.   Vanessa Dawson, MD 10/13/21 [phone]

## 2021-10-13 NOTE — Consult Note (Signed)
[place] [facility] Consult   Reason for Consult: Suicidal and Loss of Consciousness Referring Physician: Dr. 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 male patient presented to [facility] ED via [facility] from home after the patient called for help. The patient is here in the ED voluntarily. The patient gives this Probation officer the to speak with his sister Yoshi Mancillas at [phone] with no answer and no voice mail setup to leave a HIPPA-appropriate message.  Per the ED triage nurses note, Pt family reports pt had multiple syncopal episodes today. Pt does not recall any syncope today, denies injury. Pt did state that he fell this AM, scabbed abrasion to right elbow noted. Scabbing noted to top of head. Pt also reports he feels depressed and suicidal. Pt states he took an unprescribed subaxone today.  This provider saw the patient face-to-face; the chart was reviewed and consulted with Dr. Starleen Blue on 10/13/2021 due to the patient's care. It was discussed with the EDP that the patient does not meet the criteria for psychiatric inpatient admission. On evaluation, the patient is alert and oriented x 4, anxious, depressed, cooperative, and mood-congruent with affect. The patient does not appear to be responding to internal or external stimuli. The patient is presenting with some delusional thinking. The patient denies auditory or visual hallucinations. The patient admits to chronic suicidal  ideation. The patient denies homicidal or self-harm ideations. The patient is not presenting with any psychotic or paranoid behaviors.  Collateral was obtained from [facility] staff, who said that the patient is chronically suicidal when he does not get the medications he wants. The staff stated that the patient has never attempted suicide. He continuously verbalized SI. The patient lives at home with his mom, two kids, sister, and the sister's boyfriend. [facility] staff shared that the patient is in a safe environment. She shared that they will follow up with the patient tomorrow, 07.21.23.  Plan: The patient is psychiatrically cleared  HPI: Per Dr. Jari Pigg, Michael Schmitt is a 51 y.o. male  who comes in with SI and HI. He wants to kill self with rope. He does report having a fall this morning and has some scabs on his bilateral knees. He denies hitting his head. He took suboxone this morning. He reports he took it for pain. No alcohol use. He denies any passing out.  The nurse got a call from the family who reported that he had a syncopal episode today unclear if he hit his head but there was concern that patient's Suboxone that he took was not prescribed to him.  He himself however is declining and having the syncopal episodes  Past Psychiatric History:  Anxiety Bipolar 1 disorder (HCC) Depression  Risk to Self:   Risk to Others:   Prior Inpatient Therapy:   Prior Outpatient Therapy:    Past Medical History:  Past Medical History:  Diagnosis Date   Anxiety    Anxiety    Arthritis    cerv. stenosis, spondylosis, HNP- lower back , has  been followed in pain clinic, has  had injection s in cerv. 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 Cervical three-four.   CARDIAC CATHETERIZATION N/A 01/01/2015   Procedure: Left Heart Cath and Coronary Angiography;  Surgeon: [name], MD;  Location: [facility];  Service: Cardiovascular;  Laterality: N/A;   CARDIAC CATHETERIZATION N/A 04/05/2016   Procedure: Left Heart Cath and Coronary Angiography;  Surgeon: Belva Crome, MD;  Location: [facility];  Service: Cardiovascular;  Laterality: N/A;   CARDIAC CATHETERIZATION N/A 04/05/2016   Procedure: Coronary Stent Intervention;  Surgeon: Belva Crome, MD;  Location: [facility];  Service: Cardiovascular;  Laterality: N/A;   CARDIAC CATHETERIZATION N/A 04/05/2016   Procedure: Intravascular Ultrasound/IVUS;  Surgeon: Belva Crome, MD;  Location: [facility];  Service: Cardiovascular;  Laterality: N/A;   NASAL SINUS SURGERY     2005   Family History:  Family History  Problem Relation Age of Onset   Prostate cancer Father    Hypertension Mother    Kidney Stones Mother    Anxiety disorder Mother    Depression Mother    COPD Sister    Hypertension Sister    Diabetes Sister    Depression Sister    Anxiety disorder Sister    Seizures Sister    ADD / ADHD Son    ADD / ADHD Daughter     Family Psychiatric  History:  Social History:  Social History   Substance and Sexual Activity  Alcohol Use No   Alcohol/week: 0.0 standard drinks of alcohol   Comment: last drinked in 3 months.      Social History   Substance and Sexual Activity  Drug Use No    Social History   Socioeconomic History   Marital status: Single    Spouse name: Not on file   Number of children: 3   Years of education: 12   Highest education level: Not on file  Occupational History    Comment: unemployed  Tobacco Use   Smoking status: Every Day    Packs/day: 2.00    Years: 17.00    Total pack years: 34.00    Types: Cigarettes, Cigars   Smokeless tobacco: Former   Tobacco comments:    occassional snuff   Vaping Use   Vaping Use: Never used  Substance and Sexual Activity   Alcohol use: No  Alcohol/week: 0.0 standard drinks of alcohol    Comment: last drinked in 3 months.    Drug use: No   Sexual activity: Not Currently  Other Topics Concern   Not on file  Social History Narrative   Patient is right handed.   Patient drinks 2 sodas daily.   Social Determinants of Health   Financial Resource Strain: Not on file  Food Insecurity: Not on file  Transportation Needs: Not on file  Physical Activity: Not on file  Stress: Not on file  Social Connections: Not on file   Additional Social History:    Allergies:   Allergies  Allergen Reactions   Prednisone Other (See Comments)    Hypertension and makes him feel "spacey"   Amoxicillin Other (See Comments)    Other Reaction: ELEVATED BP   Hydrocodone Other (See Comments)    Causes headaches and makes the patient irritable   Varenicline Other (See Comments)    Suicidal thoughts   Other Anxiety    Other reaction(s): Other (See Comments) Patient stated that this keeps him awake; "has an opposite effect on me"   Trazodone And Nefazodone Anxiety and Other (See Comments)    Patient stated that this keeps him awake; "has an opposite  effect on me"    Labs:  Results for orders placed or performed during the hospital encounter of 10/13/21 (from the past 48 hour(s))  Comprehensive metabolic panel     Status: Abnormal   Collection Time: 10/13/21  2:00 PM  Result Value Ref Range   Sodium 137 135 - 145 mmol/L   Potassium 3.7 3.5 - 5.1 mmol/L   Chloride 103 98 - 111 mmol/L   CO2 27 22 - 32 mmol/L   Glucose, Bld 176 (H) 70 - 99 mg/dL    Comment: Glucose reference range applies only to samples taken after fasting for at least 8 hours.   BUN 14 6 - 20 mg/dL   Creatinine, Ser 1.16 0.61 - 1.24 mg/dL   Calcium 8.9 8.9 - 10.3 mg/dL   Total Protein 6.9 6.5 - 8.1 g/dL   Albumin 3.7 3.5 - 5.0 g/dL   AST 26 15 - 41 U/L   ALT 15 0 - 44 U/L   Alkaline Phosphatase 78 38 - 126 U/L   Total Bilirubin 0.4 0.3 - 1.2 mg/dL   GFR, Estimated >60 >60 mL/min    Comment: (NOTE) Calculated using the CKD-EPI Creatinine Equation (2021)    Anion gap 7 5 - 15    Comment: Performed at [facility], [address] [place], [place] [address]  Ethanol     Status: None   Collection Time: 10/13/21  2:00 PM  Result Value Ref Range   Alcohol, Ethyl (B) <10 <10 mg/dL    Comment: (NOTE) Lowest detectable limit for serum alcohol is 10 mg/dL.  For medical purposes only. Performed at [facility], [place], [place] [address]   Salicylate level     Status: Abnormal   Collection Time: 10/13/21  2:00 PM  Result Value Ref Range   Salicylate Lvl <1.6 (L) 7.0 - 30.0 mg/dL    Comment: Performed at [facility], [address] [place], [place] [address]  Acetaminophen level     Status: Abnormal   Collection Time: 10/13/21  2:00 PM  Result Value Ref Range   Acetaminophen (Tylenol), Serum <10 (L) 10 - 30 ug/mL    Comment: (NOTE) Therapeutic concentrations vary significantly. A range of 10-30 ug/mL  may be an effective concentration for many patients. However, some  are best treated at concentrations outside  of this range. Acetaminophen concentrations >150 ug/mL at 4 hours after ingestion  and >50 ug/mL at 12 hours after ingestion are often associated with  toxic reactions.  Performed at [facility], [address] [place], [place] [address]   cbc     Status: Abnormal   Collection Time: 10/13/21  2:00 PM  Result Value Ref Range   WBC 7.0 4.0 - 10.5 K/uL   RBC 3.76 (L) 4.22 - 5.81 MIL/uL   Hemoglobin 11.7 (L) 13.0 - 17.0 g/dL   HCT 37.7 (L) 39.0 - 52.0 %   MCV 100.3 (H) 80.0 - 100.0 fL   MCH 31.1 26.0 - 34.0 pg   MCHC 31.0 30.0 - 36.0 g/dL   RDW 13.9 11.5 - 15.5 %   Platelets 224 150 - 400 K/uL   nRBC 0.0 0.0 - 0.2 %    Comment: Performed at [facility], [address], [place], [place] [address]  Troponin I (High Sensitivity)     Status: None   Collection Time: 10/13/21  2:00 PM  Result Value Ref Range   Troponin I (High Sensitivity) 5 <18 ng/L    Comment: (NOTE) Elevated high sensitivity troponin I (hsTnI) values and significant  changes across serial measurements may suggest ACS but many other  chronic and acute conditions are known to elevate hsTnI results.  Refer to the "Links" section for chest pain algorithms and additional  guidance. Performed at [facility], [address] [place], [place] [address]   Valproic acid level     Status: Abnormal   Collection Time: 10/13/21  2:13 PM  Result Value Ref Range   Valproic Acid Lvl 49 (L) 50.0 - 100.0 ug/mL    Comment: Performed at [facility], [address] [place], [place] [address]  Resp Panel by RT-PCR (Flu A&B, Covid) Anterior Nasal Swab     Status: None   Collection Time: 10/13/21  2:21 PM   Specimen: Anterior Nasal Swab  Result Value Ref Range   SARS Coronavirus 2 by RT PCR NEGATIVE NEGATIVE    Comment: (NOTE) SARS-CoV-2 target nucleic acids are NOT DETECTED.  The SARS-CoV-2 RNA is generally detectable in upper respiratory specimens during the acute phase of infection.  The lowest concentration of SARS-CoV-2 viral copies this assay can detect is 138 copies/mL. A negative result does not preclude SARS-Cov-2 infection and should not be used as the sole basis for treatment or other patient management decisions. A negative result may occur with  improper specimen collection/handling, submission of specimen other than nasopharyngeal swab, presence of viral mutation(s) within the areas targeted by this assay, and inadequate number of viral copies(<138 copies/mL). A negative result must be combined with clinical observations, patient history, and epidemiological information. The expected result is Negative.  Fact Sheet for Patients:  EntrepreneurPulse.com.au  Fact Sheet for Healthcare Providers:  IncredibleEmployment.be  This test is no t yet approved or cleared by the [place] FDA and  has been authorized for detection and/or diagnosis of SARS-CoV-2 by FDA under an Emergency Use Authorization (EUA). This EUA will remain  in effect (meaning this test can be used) for the duration of the COVID-19 declaration under Section 564(b)(1) of the Act, 21 U.S.C.section 360bbb-3(b)(1), unless the authorization is terminated  or revoked sooner.       Influenza A by PCR NEGATIVE NEGATIVE   Influenza B by PCR  NEGATIVE NEGATIVE    Comment: (NOTE) The Xpert Xpress SARS-CoV-2/FLU/RSV plus assay is intended as an aid in the diagnosis of influenza from Nasopharyngeal swab specimens and should not be used as a sole basis for treatment. Nasal washings and aspirates are unacceptable for Xpert Xpress SARS-CoV-2/FLU/RSV testing.  Fact Sheet for Patients: EntrepreneurPulse.com.au  Fact Sheet for Healthcare Providers: IncredibleEmployment.be  This test is not yet approved or cleared by the [place] FDA and has been authorized for detection and/or diagnosis of SARS-CoV-2 by FDA under an  Emergency Use Authorization (EUA). This EUA will remain in effect (meaning this test can be used) for the duration of the COVID-19 declaration under Section 564(b)(1) of the Act, 21 U.S.C. section 360bbb-3(b)(1), unless the authorization is terminated or revoked.  Performed at [facility], [address] [place], [place] [address]   Urine Drug Screen, Qualitative     Status: Abnormal   Collection Time: 10/13/21  2:25 PM  Result Value Ref Range   Tricyclic, Ur Screen POSITIVE (A) NONE DETECTED   Amphetamines, Ur Screen NONE DETECTED NONE DETECTED   MDMA (Ecstasy)Ur Screen NONE DETECTED NONE DETECTED   Cocaine Metabolite,Ur [place] NONE DETECTED NONE DETECTED   Opiate, Ur Screen NONE DETECTED NONE DETECTED   Phencyclidine (PCP) Ur S NONE DETECTED NONE DETECTED   Cannabinoid 50 Ng, Ur [place] NONE DETECTED NONE DETECTED   Barbiturates, Ur Screen NONE DETECTED NONE DETECTED   Benzodiazepine, Ur Scrn NONE DETECTED NONE DETECTED   Methadone Scn, Ur NONE DETECTED NONE DETECTED    Comment: (NOTE) Tricyclics + metabolites, urine    Cutoff 1000 ng/mL Amphetamines + metabolites, urine  Cutoff 1000 ng/mL MDMA (Ecstasy), urine              Cutoff 500 ng/mL Cocaine Metabolite, urine          Cutoff 300 ng/mL Opiate + metabolites, urine        Cutoff 300 ng/mL Phencyclidine (PCP), urine         Cutoff 25 ng/mL Cannabinoid, urine                 Cutoff 50 ng/mL Barbiturates + metabolites, urine  Cutoff 200 ng/mL Benzodiazepine, urine              Cutoff 200 ng/mL Methadone, urine                   Cutoff 300 ng/mL  The urine drug screen provides only a preliminary, unconfirmed analytical test result and should not be used for non-medical purposes. Clinical consideration and professional judgment should be applied to any positive drug screen result due to possible interfering substances. A more specific alternate chemical method must be used in order to obtain a confirmed analytical  result. Gas chromatography / mass spectrometry (GC/MS) is the preferred confirm atory method. Performed at [facility], [address] [place], [place] [address]     No current facility-administered medications for this encounter.   Current Outpatient Medications  Medication Sig Dispense Refill   acamprosate (CAMPRAL) 333 MG tablet Take 666 mg by mouth 3 (three) times daily with meals.     amLODipine (NORVASC) 5 MG tablet Take 5 mg by mouth daily.     atorvastatin (LIPITOR) 20 MG tablet Take 20 mg by mouth daily.     carvedilol (COREG) 6.25 MG tablet Take 6.25 mg by mouth 2 (two) times daily with a meal.     chlorproMAZINE (THORAZINE) 25 MG tablet Take 1  tablet (25 mg total) by mouth 3 (three) times daily. 90 tablet 2   clopidogrel (PLAVIX) 75 MG tablet Take 75 mg by mouth daily.     divalproex (DEPAKOTE ER) 500 MG 24 hr tablet Take 2,000 mg by mouth daily. Take 500mg  in the morning Take 1,500mg  at bedtime     doxepin (SINEQUAN) 25 MG capsule Take 25 mg by mouth.     escitalopram (LEXAPRO) 10 MG tablet Take 1 tablet (10 mg total) by mouth daily. 30 tablet 2   glipiZIDE (GLUCOTROL XL) 10 MG 24 hr tablet Take 10 mg by mouth daily with breakfast.     haloperidol (HALDOL) 5 MG tablet Take 5 mg by mouth 2 (two) times daily.     metFORMIN (GLUCOPHAGE) 500 MG tablet Take 1,000 mg by mouth 2 (two) times daily.     mirtazapine (REMERON) 30 MG tablet Take 60 mg by mouth at bedtime.     pantoprazole (PROTONIX) 40 MG tablet Take 1 tablet by mouth daily.     propranolol (INDERAL) 10 MG tablet Take 1 tablet (10 mg total) by mouth 2 (two) times daily. 60 tablet 2   QUEtiapine (SEROQUEL) 400 MG tablet Take 2 tablets (800 mg total) by mouth at bedtime. 30 tablet 2   albuterol (VENTOLIN HFA) 108 (90 Base) MCG/ACT inhaler Inhale into the lungs every 6 (six) hours as needed for wheezing or shortness of breath.     cyclobenzaprine (FLEXERIL) 10 MG tablet Take 10 mg by mouth at bedtime.      meloxicam (MOBIC) 7.5 MG tablet Take 7.5 mg by mouth daily.     naltrexone (DEPADE) 50 MG tablet Take 1 tablet (50 mg total) by mouth daily. 30 tablet 2   pregabalin (LYRICA) 75 MG capsule Take 75 mg by mouth 2 (two) times daily.      Musculoskeletal: Strength & Muscle Tone: within normal limits Gait & Station: normal Patient leans: N/A  Psychiatric Specialty Exam:  Presentation  General Appearance: Bizarre  Eye Contact:Fair  Speech:Clear and Coherent  Speech Volume:Normal  Handedness:Right   Mood and Affect  Mood:Euthymic  Affect:Blunt   Thought Process  Thought Processes:Coherent  Descriptions of Associations:Intact  Orientation:Full (Time, Place and Person)  Thought Content:WDL  History of Schizophrenia/Schizoaffective disorder:No  Duration of Psychotic Symptoms:No data recorded Hallucinations:Hallucinations: None  Ideas of Reference:None  Suicidal Thoughts:Suicidal Thoughts: Yes, Passive SI Passive Intent and/or Plan: Without Intent; Without Plan; Without Access to Means  Homicidal Thoughts:Homicidal Thoughts: No   Sensorium  Memory:Immediate Good; Recent Good; Remote Good  Judgment:Fair  Insight:Lacking   Executive Functions  Concentration:Fair  Attention Span:Fair  [place]   Psychomotor Activity  Psychomotor Activity:Psychomotor Activity: Normal   Assets  Assets:Communication Skills; Desire for Improvement; Physical Health; Resilience; Social Support   Sleep  Sleep:Sleep: Good   Physical Exam: Physical Exam Vitals and nursing note reviewed.  Constitutional:      Appearance: Normal appearance.  HENT:     Head: Normocephalic and atraumatic.     Right Ear: External ear normal.     Left Ear: External ear normal.     Nose: Nose normal.     Mouth/Throat:     Mouth: Mucous membranes are moist.  Cardiovascular:     Rate and Rhythm: Normal rate.     Pulses: Normal pulses.   Pulmonary:     Effort: Pulmonary effort is normal.  Musculoskeletal:        General: Normal range of motion.  Cervical back: Normal range of motion and neck supple.  Neurological:     General: No focal deficit present.     Mental Status: He is alert and oriented to person, place, and time.  Psychiatric:        Attention and Perception: Attention and perception normal.        Mood and Affect: Mood is depressed. Affect is blunt.        Speech: Speech is delayed.        Behavior: Behavior normal. Behavior is cooperative.        Thought Content: Thought content includes suicidal ideation.        Cognition and Memory: Cognition and memory normal.        Judgment: Judgment normal.    Review of Systems  Psychiatric/Behavioral:  Positive for depression and suicidal ideas.   All other systems reviewed and are negative.  Blood pressure 114/75, pulse 74, temperature 98.4 F (36.9 C), temperature source Oral, resp. rate 16, height 6\' 2"  (1.88 m), weight 90.3 kg, SpO2 92 %. Body mass index is 25.56 kg/m.  Treatment Plan Summary: Plan Patient does not meet criteria for psychiatric inpatient admission  Disposition: No evidence of imminent risk to self or others at present.   Patient does not meet criteria for psychiatric inpatient admission. Supportive therapy provided about ongoing stressors. Discussed crisis plan, support from social network, calling 911, coming to the Emergency Department, and calling Suicide Hotline.  Caroline Sauger, NP 10/13/2021 10:31 PM

## 2021-10-13 NOTE — ED Notes (Signed)
Yelling, hitting himself in head. EDP notified.

## 2021-10-13 NOTE — ED Notes (Signed)
Pt aware of need for urine sample; has ambulated to bathroom times without successful urine collection

## 2021-10-14 ENCOUNTER — Encounter (HOSPITAL_COMMUNITY): Payer: Self-pay | Admitting: Emergency Medicine

## 2021-10-14 ENCOUNTER — Emergency Department (HOSPITAL_COMMUNITY)
Admission: EM | Admit: 2021-10-14 | Discharge: 2021-10-15 | Disposition: A | Discharge status: Home / Self Care | Payer: Medicare Other | Attending: Emergency Medicine | Admitting: Emergency Medicine

## 2021-10-14 ENCOUNTER — Other Ambulatory Visit: Payer: Self-pay

## 2021-10-14 DIAGNOSIS — F1319 Sedative, hypnotic or anxiolytic abuse with unspecified sedative, hypnotic or anxiolytic-induced disorder: Secondary | ICD-10-CM | POA: Diagnosis not present

## 2021-10-14 DIAGNOSIS — F319 Bipolar disorder, unspecified: Secondary | ICD-10-CM

## 2021-10-14 DIAGNOSIS — Z79899 Other long term (current) drug therapy: Secondary | ICD-10-CM | POA: Insufficient documentation

## 2021-10-14 DIAGNOSIS — F1994 Other psychoactive substance use, unspecified with psychoactive substance-induced mood disorder: Secondary | ICD-10-CM | POA: Diagnosis present

## 2021-10-14 DIAGNOSIS — Z765 Malingerer [conscious simulation]: Secondary | ICD-10-CM

## 2021-10-14 DIAGNOSIS — R45851 Suicidal ideations: Secondary | ICD-10-CM

## 2021-10-14 DIAGNOSIS — T43625A Adverse effect of amphetamines, initial encounter: Secondary | ICD-10-CM | POA: Diagnosis not present

## 2021-10-14 DIAGNOSIS — T887XXA Unspecified adverse effect of drug or medicament, initial encounter: Secondary | ICD-10-CM | POA: Diagnosis not present

## 2021-10-14 DIAGNOSIS — F1590 Other stimulant use, unspecified, uncomplicated: Secondary | ICD-10-CM | POA: Diagnosis present

## 2021-10-14 DIAGNOSIS — F1310 Sedative, hypnotic or anxiolytic abuse, uncomplicated: Secondary | ICD-10-CM | POA: Diagnosis present

## 2021-10-14 DIAGNOSIS — F609 Personality disorder, unspecified: Secondary | ICD-10-CM | POA: Diagnosis present

## 2021-10-14 LAB — ETHANOL: Alcohol, Ethyl (B): <10 mg/dL (ref <10)

## 2021-10-14 LAB — COMPREHENSIVE METABOLIC PANEL
ALT: 15 U/L (ref 0–44)
AST: 25 U/L (ref 15–41)
Albumin: 3.5 g/dL (ref 3.5–5.0)
Alkaline Phosphatase: 84 U/L (ref 38–126)
Anion gap: 8 (ref 5–15)
BUN: 11 mg/dL (ref 6–20)
CO2: 25 mmol/L (ref 22–32)
Calcium: 8.8 mg/dL — ABNORMAL LOW (ref 8.9–10.3)
Chloride: 103 mmol/L (ref 98–111)
Creatinine, Ser: 0.93 mg/dL (ref 0.61–1.24)
GFR, Estimated: >60 mL/min (ref >60)
Glucose, Bld: 140 mg/dL — ABNORMAL HIGH (ref 70–99)
Potassium: 3.4 mmol/L — ABNORMAL LOW (ref 3.5–5.1)
Sodium: 136 mmol/L (ref 135–145)
Total Bilirubin: 0.4 mg/dL (ref 0.3–1.2)
Total Protein: 6.4 g/dL — ABNORMAL LOW (ref 6.5–8.1)

## 2021-10-14 LAB — ACETAMINOPHEN LEVEL: Acetaminophen (Tylenol), Serum: <10 ug/mL — ABNORMAL LOW (ref 10–30)

## 2021-10-14 LAB — RAPID URINE DRUG SCREEN, HOSP PERFORMED
Amphetamines: POSITIVE — AB
Barbiturates: NOT DETECTED
Benzodiazepines: NOT DETECTED
Cocaine: NOT DETECTED
Opiates: NOT DETECTED
Tetrahydrocannabinol: NOT DETECTED

## 2021-10-14 LAB — CBC
HCT: 34.3 % — ABNORMAL LOW (ref 39.0–52.0)
Hemoglobin: 11.3 g/dL — ABNORMAL LOW (ref 13.0–17.0)
MCH: 32.1 pg (ref 26.0–34.0)
MCHC: 32.9 g/dL (ref 30.0–36.0)
MCV: 97.4 fL (ref 80.0–100.0)
Platelets: 230 10*3/uL (ref 150–400)
RBC: 3.52 MIL/uL — ABNORMAL LOW (ref 4.22–5.81)
RDW: 14 % (ref 11.5–15.5)
WBC: 4.5 10*3/uL (ref 4.0–10.5)
nRBC: 0 % (ref 0.0–0.2)

## 2021-10-14 LAB — SALICYLATE LEVEL: Salicylate Lvl: <7 mg/dL — ABNORMAL LOW (ref 7.0–30.0)

## 2021-10-14 MED ORDER — LORAZEPAM 1 MG PO TABS
2.0000 mg | ORAL_TABLET | Freq: Once | ORAL | Status: AC
  Administered 2021-10-14: 2 mg via ORAL
  Filled 2021-10-14: qty 2

## 2021-10-14 MED ORDER — HYDROXYZINE HCL 25 MG PO TABS
50.0000 mg | ORAL_TABLET | Freq: Three times a day (TID) | ORAL | Status: DC | PRN
  Administered 2021-10-15 (×2): 50 mg via ORAL
  Filled 2021-10-14 (×2): qty 2

## 2021-10-14 MED ORDER — GABAPENTIN 300 MG PO CAPS
300.0000 mg | ORAL_CAPSULE | Freq: Three times a day (TID) | ORAL | Status: DC
Start: 2021-10-14 — End: 2021-10-15
  Administered 2021-10-14 – 2021-10-15 (×4): 300 mg via ORAL
  Filled 2021-10-14 (×4): qty 1

## 2021-10-14 MED ORDER — DIVALPROEX SODIUM ER 500 MG PO TB24
2000.0000 mg | ORAL_TABLET | Freq: Every day | ORAL | Status: DC
  Administered 2021-10-14 – 2021-10-15 (×2): 2000 mg via ORAL
  Filled 2021-10-14 (×2): qty 4

## 2021-10-14 MED ORDER — CHLORPROMAZINE HCL 25 MG PO TABS
25.0000 mg | ORAL_TABLET | Freq: Three times a day (TID) | ORAL | Status: DC
  Administered 2021-10-15 (×3): 25 mg via ORAL
  Filled 2021-10-14 (×3): qty 1

## 2021-10-14 MED ORDER — TRIPLE ANTIBIOTIC 3.5-400-5000 EX OINT
1.0000 | TOPICAL_OINTMENT | Freq: Once | CUTANEOUS | Status: AC
  Administered 2021-10-14: 1 via CUTANEOUS

## 2021-10-14 MED ORDER — LORAZEPAM 1 MG PO TABS: 1.0000 mg | ORAL_TABLET | Freq: Once | ORAL | Status: DC

## 2021-10-14 MED ORDER — BACITRACIN ZINC 500 UNIT/GM EX OINT
TOPICAL_OINTMENT | CUTANEOUS | Status: AC
  Filled 2021-10-14: qty 2.7

## 2021-10-14 NOTE — Discharge Instructions (Signed)

## 2021-10-14 NOTE — ED Notes (Signed)
Pt requesting to stay to "stay and sleep until the morning" this RN and Charge RN austin told pt about disposition and pt is unable to stay in room to sleep once he has been medically and psychiatrically cleared. Charge RN austin made aware of situation and pt to stay in lobby.

## 2021-10-14 NOTE — ED Notes (Signed)
Pt discharge signature placed in medical record bin.  ?

## 2021-10-14 NOTE — ED Provider Notes (Signed)
[facility] EMERGENCY DEPARTMENT Provider Note   CSN: [id number] Arrival date & time: 10/14/21  1026     History  Chief Complaint  Patient presents with   V70.1    Michael Schmitt is a 51 y.o. male.  Patient is a 51 year old male with past medical history anxiety, bipolar 1 disorder, mental disorder, and restless leg syndrome presenting for suicidal ideation.  Review demonstrates patient was at [facility] last night where he was seen in the emergency department for suicidal ideation and evaluated by psychiatric services and recommended safe for discharge home.  Currently has a plan for suicide stating he is going to hang himself.  The history is provided by the patient. No language interpreter was used.       Home Medications Prior to Admission medications   Medication Sig Start Date End Date Taking? Authorizing Provider  acamprosate (CAMPRAL) 333 MG tablet Take 666 mg by mouth 3 (three) times daily with meals.   Yes [provider]  amLODipine (NORVASC) 5 MG tablet Take 5 mg by mouth daily. 09/17/21  Yes [provider]  atorvastatin (LIPITOR) 20 MG tablet Take 20 mg by mouth daily. 12/16/19  Yes [provider]  carvedilol (COREG) 6.25 MG tablet Take 6.25 mg by mouth 2 (two) times daily with a meal. 10/25/18  Yes [provider]  chlorproMAZINE (THORAZINE) 25 MG tablet Take 1 tablet (25 mg total) by mouth 3 (three) times daily. 07/23/21  Yes Harvest Dark, MD  clopidogrel (PLAVIX) 75 MG tablet Take 75 mg by mouth daily. 12/16/19  Yes [provider]  divalproex (DEPAKOTE ER) 500 MG 24 hr tablet Take 2,000 mg by mouth daily. Take 500mg  in the morning Take 1,500mg  at bedtime   Yes [provider]  doxepin (SINEQUAN) 25 MG capsule Take 25 mg by mouth.   Yes [provider]  escitalopram (LEXAPRO) 10 MG tablet Take 1 tablet (10 mg total) by mouth daily. 07/23/21 10/21/21 Yes Paduchowski, Lennette Bihari, MD   glipiZIDE (GLUCOTROL XL) 10 MG 24 hr tablet Take 10 mg by mouth daily with breakfast.   Yes [provider]  haloperidol (HALDOL) 5 MG tablet Take 5 mg by mouth 2 (two) times daily. 09/01/21  Yes [provider]  metFORMIN (GLUCOPHAGE) 500 MG tablet Take 1,000 mg by mouth 2 (two) times daily. 10/02/18  Yes [provider]  mirtazapine (REMERON) 30 MG tablet Take 60 mg by mouth at bedtime. 08/01/21  Yes [provider]  pantoprazole (PROTONIX) 40 MG tablet Take 1 tablet by mouth daily. 08/18/20  Yes [provider]  propranolol (INDERAL) 10 MG tablet Take 1 tablet (10 mg total) by mouth 2 (two) times daily. 07/23/21  Yes Harvest Dark, MD  QUEtiapine (SEROQUEL) 400 MG tablet Take 2 tablets (800 mg total) by mouth at bedtime. 07/23/21  Yes Harvest Dark, MD  albuterol (VENTOLIN HFA) 108 (90 Base) MCG/ACT inhaler Inhale into the lungs every 6 (six) hours as needed for wheezing or shortness of breath. Patient not taking: Reported on 10/14/2021    [provider]  cyclobenzaprine (FLEXERIL) 10 MG tablet Take 10 mg by mouth at bedtime. Patient not taking: Reported on 10/14/2021 08/22/21   [provider]  meloxicam (MOBIC) 7.5 MG tablet Take 7.5 mg by mouth daily. Patient not taking: Reported on 10/14/2021    [provider]  naltrexone (DEPADE) 50 MG tablet Take 1 tablet (50 mg total) by mouth daily. Patient not taking: Reported on 10/14/2021  07/23/21   Harvest Dark, MD  pregabalin (LYRICA) 75 MG capsule Take 75 mg by mouth 2 (two) times daily. Patient not taking: Reported on 10/14/2021 08/22/21   [provider]      Allergies    Prednisone, Amoxicillin, Hydrocodone, Varenicline, Other, and Trazodone and nefazodone    Review of Systems   Review of Systems  Constitutional:  Negative for chills and fever.  HENT:  Negative for ear pain and sore throat.   Eyes:  Negative for pain and visual disturbance.  Respiratory:   Negative for cough and shortness of breath.   Cardiovascular:  Negative for chest pain and palpitations.  Gastrointestinal:  Negative for abdominal pain and vomiting.  Genitourinary:  Negative for dysuria and hematuria.  Musculoskeletal:  Negative for arthralgias and back pain.  Skin:  Negative for color change and rash.  Neurological:  Negative for seizures and syncope.  Psychiatric/Behavioral:  Positive for suicidal ideas.   All other systems reviewed and are negative.   Physical Exam Updated Vital Signs BP (!) 126/91 (BP Location: Left Arm)   Pulse 86   Temp 98.2 F (36.8 C)   Resp 18   Ht 6\' 2"  (1.88 m)   Wt 90.3 kg   SpO2 95%   BMI 25.56 kg/m  Physical Exam Vitals and nursing note reviewed.  Constitutional:      General: He is not in acute distress.    Appearance: He is well-developed.  HENT:     Head: Normocephalic and atraumatic.  Eyes:     Conjunctiva/sclera: Conjunctivae normal.  Cardiovascular:     Rate and Rhythm: Normal rate and regular rhythm.     Heart sounds: No murmur heard. Pulmonary:     Effort: Pulmonary effort is normal. No respiratory distress.     Breath sounds: Normal breath sounds.  Abdominal:     Palpations: Abdomen is soft.     Tenderness: There is no abdominal tenderness.  Musculoskeletal:        General: No swelling.     Cervical back: Neck supple.  Skin:    General: Skin is warm and dry.     Capillary Refill: Capillary refill takes less than 2 seconds.  Neurological:     Mental Status: He is alert.  Psychiatric:        Mood and Affect: Mood is anxious.        Behavior: Behavior is hyperactive.        Thought Content: Thought content includes suicidal ideation. Thought content includes suicidal plan.     ED Results / Procedures / Treatments   Labs (all labs ordered are listed, but only abnormal results are displayed) Labs Reviewed  CBC - Abnormal; Notable for the following components:      Result Value   RBC 3.52 (*)     Hemoglobin 11.3 (*)    HCT 34.3 (*)    All other components within normal limits  COMPREHENSIVE METABOLIC PANEL  ETHANOL  SALICYLATE LEVEL  ACETAMINOPHEN LEVEL  RAPID URINE DRUG SCREEN, HOSP PERFORMED    EKG None  Radiology CT HEAD WO CONTRAST (5MM)  Result Date: 10/13/2021 CLINICAL DATA:  Ataxia, cervical trauma; Head trauma, abnormal mental status (Age 44-64y). Multiple syncopal episodes today. EXAM: CT HEAD WITHOUT CONTRAST CT CERVICAL SPINE WITHOUT CONTRAST TECHNIQUE: Multidetector CT imaging of the head and cervical spine was performed following the standard protocol without intravenous contrast. Multiplanar CT image reconstructions of the cervical spine were also generated. RADIATION DOSE REDUCTION: This exam was  performed according to the departmental dose-optimization program which includes automated exposure control, adjustment of the mA and/or kV according to patient size and/or use of iterative reconstruction technique. COMPARISON:  Head CT 01/12/2021. Head MRI 12/16/2020. Cervical spine MRI 12/08/2020. Cervical spine CT 03/07/2018. FINDINGS: CT HEAD FINDINGS Brain: There is no evidence of an acute infarct, intracranial hemorrhage, mass, midline shift, or extra-axial fluid collection. The ventricles and sulci are normal. Vascular: No hyperdense vessel. Skull: No fracture or suspicious osseous lesion. Sinuses/Orbits: Prior sinus surgery with minimal scattered mucosal thickening. Clear mastoid air cells. Unremarkable orbits. Other: None. CT CERVICAL SPINE FINDINGS Alignment: Normal. Skull base and vertebrae: No acute fracture or suspicious osseous lesion. Soft tissues and spinal canal: No prevertebral fluid or swelling. No visible canal hematoma. Disc levels: C3-4 ACDF with solid interbody arthrodesis as well as bilateral facet ankylosis. Asymmetrically severe left facet arthrosis from C2-3 through C5-6 resulting in neural foraminal stenosis which is severe on the left at C4-5 and C5-6.  Upper chest: Clear lung apices. Other: None. IMPRESSION: 1. No evidence of acute intracranial abnormality. 2. No evidence of acute cervical spine fracture or subluxation. 3. Solid C3-4 ACDF.  Severe multilevel left-sided facet arthrosis. Electronically Signed   By: Logan Bores M.D.   On: 10/13/2021 14:59   CT Cervical Spine Wo Contrast  Result Date: 10/13/2021 CLINICAL DATA:  Ataxia, cervical trauma; Head trauma, abnormal mental status (Age 83-64y). Multiple syncopal episodes today. EXAM: CT HEAD WITHOUT CONTRAST CT CERVICAL SPINE WITHOUT CONTRAST TECHNIQUE: Multidetector CT imaging of the head and cervical spine was performed following the standard protocol without intravenous contrast. Multiplanar CT image reconstructions of the cervical spine were also generated. RADIATION DOSE REDUCTION: This exam was performed according to the departmental dose-optimization program which includes automated exposure control, adjustment of the mA and/or kV according to patient size and/or use of iterative reconstruction technique. COMPARISON:  Head CT 01/12/2021. Head MRI 12/16/2020. Cervical spine MRI 12/08/2020. Cervical spine CT 03/07/2018. FINDINGS: CT HEAD FINDINGS Brain: There is no evidence of an acute infarct, intracranial hemorrhage, mass, midline shift, or extra-axial fluid collection. The ventricles and sulci are normal. Vascular: No hyperdense vessel. Skull: No fracture or suspicious osseous lesion. Sinuses/Orbits: Prior sinus surgery with minimal scattered mucosal thickening. Clear mastoid air cells. Unremarkable orbits. Other: None. CT CERVICAL SPINE FINDINGS Alignment: Normal. Skull base and vertebrae: No acute fracture or suspicious osseous lesion. Soft tissues and spinal canal: No prevertebral fluid or swelling. No visible canal hematoma. Disc levels: C3-4 ACDF with solid interbody arthrodesis as well as bilateral facet ankylosis. Asymmetrically severe left facet arthrosis from C2-3 through C5-6 resulting in  neural foraminal stenosis which is severe on the left at C4-5 and C5-6. Upper chest: Clear lung apices. Other: None. IMPRESSION: 1. No evidence of acute intracranial abnormality. 2. No evidence of acute cervical spine fracture or subluxation. 3. Solid C3-4 ACDF.  Severe multilevel left-sided facet arthrosis. Electronically Signed   By: Logan Bores M.D.   On: 10/13/2021 14:59    Procedures Procedures    Medications Ordered in ED Medications  LORazepam (ATIVAN) tablet 2 mg (has no administration in time range)    ED Course/ Medical Decision Making/ A&P                           Medical Decision Making Amount and/or Complexity of Data Reviewed Labs: ordered.  Risk Prescription drug management.   19:32 PM 51 year old male with past medical history anxiety, bipolar  1 disorder, mental disorder, and restless leg syndrome presenting for suicidal ideation.  Patient alert and oriented x2, afebrile, stable vital signs.  Admitting to suicidal ideation with plan of suicide to hang himself.  Patient has history of multiple psychiatric disorders and has been evaluated several times including patient placement.  Suicide precautions placed. Medically cleared at this time and safe for patient by TTS.  Signed out to oncoming provider while awaiting recommendations.        Final Clinical Impression(s) / ED Diagnoses Final diagnoses:  Suicidal ideation  Bipolar affective disorder, remission status unspecified ([place])    Rx / DC Orders ED Discharge Orders     None         Lianne Cure, DO 12/78/71 1214

## 2021-10-14 NOTE — ED Provider Notes (Signed)
-----------------------------------------   12:35 AM on 10/14/2021 -----------------------------------------   Blood pressure 114/75, pulse 74, temperature 98.4 F (36.9 C), temperature source Oral, resp. rate 16, height 1.88 m (6\' 2" ), weight 90.3 kg, SpO2 92 %.  Patient is calm and cooperative.  The psychiatry and TTS personnel consulted on the patient in the emergency department and discussed the case with me.  They evaluated the patient and feel that he is safe for discharge back to his home.  No additional care needed at this time and outpatient follow-up is appropriate.  Discharging as per psychiatry recommendations.   Hinda Kehr, MD 10/14/21 [phone]

## 2021-10-14 NOTE — ED Notes (Signed)
Pt is asleep, will get vs when awake

## 2021-10-14 NOTE — ED Notes (Signed)
Pt yelling " Help" because he wants the doctor to give him nerve medications. Staff redirected pt to let him know the doctor will see him.

## 2021-10-14 NOTE — ED Triage Notes (Signed)
Pt to the ED with complaints of SI.  Patient states he wants help and tried to hang himself a few weeks ago.  Pt was in the waiting room yelling for help to top of his lungs.  Pt states he was at [facility] last night but did not receive any help. Pt states he had a recent psychiatric hospitalization at [facility].

## 2021-10-14 NOTE — ED Notes (Signed)
One bag put in locker phone and watch included and pt in scrubs.

## 2021-10-14 NOTE — Consult Note (Cosign Needed Addendum)
Telepsych Consultation   Reason for Consult:  psych consult Referring Physician:  Lianne Cure, DO Location of Patient:  [facility] Location of Provider: [place] Department  Patient Identification: Michael Schmitt MRN:  [id number] Principal Diagnosis: Substance induced mood disorder ([place]) Diagnosis:  Principal Problem:   Substance induced mood disorder ([place]) Active Problems:   Drug-seeking behavior   Amphetamine use   Benzodiazepine abuse ([place])   Total Time spent with patient: 20 minutes  Subjective:   Nuno Brubacher is a 51 y.o. male patient admitted with psych consult.  "I took an overdose on some Suboxone. Last night. 3 of them about 8'o clock (at night). Yeah I'd like to go back to [place]". Has [facility] ACTT services; states he "no longer has them, I cursed them out today. I need to go back to [facility]".   Patient presents alert and oriented. mood elevated and labile. Tangential. Provider attempted to explain to pt that he was at the hospital at 8 pm last night and UDS did not show any opiates; pt only responded "Oh. Well what did I test positive for? And if you discharge me today I'm going to kill myself". Once provider mentioned speaking with ACTT Team pt ended assessment and walked away.   Of note pt presented to [facility] 10/13/21 where collateral was obtained and confirmed pt safe for discharge.   Per Provider note 10/13/21 2231: "Collateral was obtained from [facility] staff, who said that the patient is chronically suicidal when he does not get the medications he wants. The staff stated that the patient has never attempted suicide. He continuously verbalized SI. The patient lives at home with his mom, two kids, sister, and the sister's boyfriend. [facility] staff shared that the patient is in a safe environment. She shared that they will follow up with the patient tomorrow, 07.21.23".   Collateral: [facility] ACTT Team [phone] ext [phone]  [id number] pt called team earlier today for ride to [place] and became upset when no one was available. Patient is medication and hospital seeking. In process of finding pt group home, pt has since refused due to him not wanting to split check. Denies any safety concerns, however didn't want to exclude any due to pt history.   Darius [facility] ACTT Team lead  [phone] Team aware of pt being at [facility]. Doctors saw pt on Wednesday and saw marks on his arm where they suspected meth use. Pt was positive for amphetamines; team states this is new for pt. Pt called this morning to be taken to [facility] and team was unable to transport; pt claimed to have overdosed. Says pt never expressed he was going to hang himself. Denies any recent SI other than last night and team felt pt was safe to return home. Discharged from hospital 09/10/21. Believes pt is possibly secondary gain. Pt is his own guardian; team possibly seeking other options.   HPI:  Michael Schmitt is a 51 year old male patient with past psychiatric history of substance induced mood disorder, drug seeking behavior, amphetamine use, benzodiazepine abuse, chronic suicidal ideation, personality disorder, alcohol use disorder severe, GAD, cannabis induced mood disorder, MDD recurrent episode who presented voluntarily to [facility] with c/o suicidal ideation with plan to hang himself. Patient presented to [facility] day prior with similar presentation where pt was deemed safe and discharged home. Patient is a resident of [facility] where he lives with his  mother, currently receives ACTT services via [place]. UDS +amphetamines, BAL<10. PDMP reviewed, no active prescriptions noted.   Past Psychiatric History: substance induced mood disorder, drug seeking behavior, amphetamine use, benzodiazepine abuse, chronic suicidal ideation, personality disorder, alcohol use disorder severe, GAD, cannabis induced mood disorder, MDD recurrent episode  Risk to  Self:   Risk to Others:   Prior Inpatient Therapy:   Prior Outpatient Therapy:    Past Medical History:  Past Medical History:  Diagnosis Date   Anxiety    Anxiety    Arthritis    cerv. stenosis, spondylosis, HNP- lower back , has been followed in pain clinic, has  had injection s in cerv. 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 Cervical three-four.   CARDIAC CATHETERIZATION N/A 01/01/2015   Procedure: Left Heart Cath and Coronary Angiography;  Surgeon: [name], MD;  Location: [place] CV LAB;  Service: Cardiovascular;  Laterality: N/A;   CARDIAC CATHETERIZATION N/A 04/05/2016   Procedure: Left Heart Cath and Coronary Angiography;  Surgeon: Belva Crome, MD;  Location: [place] CV LAB;  Service: Cardiovascular;  Laterality: N/A;   CARDIAC CATHETERIZATION N/A 04/05/2016   Procedure: Coronary Stent Intervention;  Surgeon: Belva Crome, MD;  Location: [place] CV LAB;  Service: Cardiovascular;   Laterality: N/A;   CARDIAC CATHETERIZATION N/A 04/05/2016   Procedure: Intravascular Ultrasound/IVUS;  Surgeon: Belva Crome, MD;  Location: [place] CV LAB;  Service: Cardiovascular;  Laterality: N/A;   NASAL SINUS SURGERY     2005   Family History:  Family History  Problem Relation Age of Onset   Prostate cancer Father    Hypertension Mother    Kidney Stones Mother    Anxiety disorder Mother    Depression Mother    COPD Sister    Hypertension Sister    Diabetes Sister    Depression Sister    Anxiety disorder Sister    Seizures Sister    ADD / ADHD Son    ADD / ADHD Daughter    Family Psychiatric  History: not noted Social History:  Social History   Substance and Sexual Activity  Alcohol Use No   Alcohol/week: 0.0 standard drinks of alcohol   Comment: last drinked in 3 months.      Social History   Substance and Sexual Activity  Drug Use No    Social History   Socioeconomic History   Marital  status: Single    Spouse name: Not on file   Number of children: 3   Years of education: 12   Highest education level: Not on file  Occupational History    Comment: unemployed  Tobacco Use   Smoking status: Every Day    Packs/day: 2.00    Years: 17.00    Total pack years: 34.00    Types: Cigarettes, Cigars   Smokeless tobacco: Former   Tobacco comments:    occassional snuff   Vaping Use   Vaping Use: Never used  Substance and Sexual Activity   Alcohol use: No    Alcohol/week: 0.0 standard drinks of alcohol    Comment: last drinked in 3 months.    Drug use: No   Sexual activity: Not Currently  Other Topics Concern   Not on file  Social History Narrative   Patient is right handed.   Patient drinks 2 sodas daily.   Social Determinants of Health   Financial Resource Strain: Not on file  Food Insecurity: Not on file  Transportation Needs: Not on file  Physical Activity: Not on file  Stress: Not on file  Social Connections: Not on file   Additional  Social History:    Allergies:   Allergies  Allergen Reactions   Prednisone Other (See Comments)    Hypertension and makes him feel "spacey"   Amoxicillin Other (See Comments)    Other Reaction: ELEVATED BP   Hydrocodone Other (See Comments)    Causes headaches and makes the patient irritable   Varenicline Other (See Comments)    Suicidal thoughts   Other Anxiety    Other reaction(s): Other (See Comments) Patient stated that this keeps him awake; "has an opposite effect on me"   Trazodone And Nefazodone Anxiety and Other (See Comments)    Patient stated that this keeps him awake; "has an opposite effect on me"    Labs:  Results for orders placed or performed during the hospital encounter of 10/14/21 (from the past 48 hour(s))  Rapid urine drug screen (hospital performed)     Status: Abnormal   Collection Time: 10/14/21 11:37 AM  Result Value Ref Range   Opiates NONE DETECTED NONE DETECTED   Cocaine NONE DETECTED NONE DETECTED   Benzodiazepines NONE DETECTED NONE DETECTED   Amphetamines POSITIVE (A) NONE DETECTED   Tetrahydrocannabinol NONE DETECTED NONE DETECTED   Barbiturates NONE DETECTED NONE DETECTED    Comment: (NOTE) DRUG SCREEN FOR MEDICAL PURPOSES ONLY.  IF CONFIRMATION IS NEEDED FOR ANY PURPOSE, NOTIFY LAB WITHIN 5 DAYS.  LOWEST DETECTABLE LIMITS FOR URINE DRUG SCREEN Drug Class                     Cutoff (ng/mL) Amphetamine and metabolites    1000 Barbiturate and metabolites    200 Benzodiazepine                 824 Tricyclics and metabolites     300 Opiates and metabolites        300 Cocaine and metabolites        300 THC                            50 Performed at [facility], [address], [place], [place] [address]   Comprehensive metabolic panel     Status: Abnormal   Collection Time: 10/14/21 11:45 AM  Result Value Ref Range   Sodium 136 135 -  145 mmol/L   Potassium 3.4 (L) 3.5 - 5.1 mmol/L   Chloride 103 98 - 111 mmol/L   CO2 25 22 - 32 mmol/L    Glucose, Bld 140 (H) 70 - 99 mg/dL    Comment: Glucose reference range applies only to samples taken after fasting for at least 8 hours.   BUN 11 6 - 20 mg/dL   Creatinine, Ser 0.93 0.61 - 1.24 mg/dL   Calcium 8.8 (L) 8.9 - 10.3 mg/dL   Total Protein 6.4 (L) 6.5 - 8.1 g/dL   Albumin 3.5 3.5 - 5.0 g/dL   AST 25 15 - 41 U/L   ALT 15 0 - 44 U/L   Alkaline Phosphatase 84 38 - 126 U/L   Total Bilirubin 0.4 0.3 - 1.2 mg/dL   GFR, Estimated >60 >60 mL/min    Comment: (NOTE) Calculated using the CKD-EPI Creatinine Equation (2021)    Anion gap 8 5 - 15    Comment: Performed at [facility], [address] [place], [place] [address]  cbc     Status: Abnormal   Collection Time: 10/14/21 11:45 AM  Result Value Ref Range   WBC 4.5 4.0 - 10.5 K/uL   RBC 3.52 (L) 4.22 - 5.81 MIL/uL   Hemoglobin 11.3 (L) 13.0 - 17.0 g/dL   HCT 34.3 (L) 39.0 - 52.0 %   MCV 97.4 80.0 - 100.0 fL   MCH 32.1 26.0 - 34.0 pg   MCHC 32.9 30.0 - 36.0 g/dL   RDW 14.0 11.5 - 15.5 %   Platelets 230 150 - 400 K/uL   nRBC 0.0 0.0 - 0.2 %    Comment: Performed at [facility], [address] [place], [place] [address]  Ethanol     Status: None   Collection Time: 10/14/21 11:46 AM  Result Value Ref Range   Alcohol, Ethyl (B) <10 <10 mg/dL    Comment: (NOTE) Lowest detectable limit for serum alcohol is 10 mg/dL.  For medical purposes only. Performed at [facility], [address], [place], [place] [address]   Salicylate level     Status: Abnormal   Collection Time: 10/14/21 11:46 AM  Result Value Ref Range   Salicylate Lvl <2.5 (L) 7.0 - 30.0 mg/dL    Comment: Performed at [facility], [address] [place], [place] [address]  Acetaminophen level     Status: Abnormal   Collection Time: 10/14/21 11:46 AM  Result Value Ref Range   Acetaminophen (Tylenol), Serum <10 (L) 10 - 30 ug/mL    Comment: (NOTE) Therapeutic concentrations vary significantly. A range of 10-30 ug/mL  may be an effective concentration for  many patients. However, some  are best treated at concentrations outside of this range. Acetaminophen concentrations >150 ug/mL at 4 hours after ingestion  and >50 ug/mL at 12 hours after ingestion are often associated with  toxic reactions.  Performed at [facility], [address], [place], [place] [address]     Medications:  Current Facility-Administered Medications  Medication Dose Route Frequency Provider Last Rate Last Admin   chlorproMAZINE (THORAZINE) tablet 25 mg  25 mg Oral TID Leevy-Johnson, Samai Corea A, NP       divalproex (DEPAKOTE ER) 24 hr tablet 2,000 mg  2,000 mg Oral Daily Leevy-Johnson, Romelo Sciandra A, NP       gabapentin (NEURONTIN) capsule 300 mg  300 mg Oral TID Leevy-Johnson, Drevon Plog A, NP       hydrOXYzine (ATARAX) tablet 50 mg  50 mg Oral TID  PRN Leevy-Johnson, Blaine Hamper, NP       Current Outpatient Medications  Medication Sig Dispense Refill   acamprosate (CAMPRAL) 333 MG tablet Take 666 mg by mouth 3 (three) times daily with meals.     amLODipine (NORVASC) 5 MG tablet Take 5 mg by mouth daily.     atorvastatin (LIPITOR) 20 MG tablet Take 20 mg by mouth daily.     carvedilol (COREG) 6.25 MG tablet Take 6.25 mg by mouth 2 (two) times daily with a meal.     chlorproMAZINE (THORAZINE) 25 MG tablet Take 1 tablet (25 mg total) by mouth 3 (three) times daily. 90 tablet 2   clopidogrel (PLAVIX) 75 MG tablet Take 75 mg by mouth daily.     divalproex (DEPAKOTE ER) 500 MG 24 hr tablet Take 2,000 mg by mouth daily. Take 500mg  in the morning Take 1,500mg  at bedtime     doxepin (SINEQUAN) 25 MG capsule Take 25 mg by mouth.     escitalopram (LEXAPRO) 10 MG tablet Take 1 tablet (10 mg total) by mouth daily. 30 tablet 2   glipiZIDE (GLUCOTROL XL) 10 MG 24 hr tablet Take 10 mg by mouth daily with breakfast.     haloperidol (HALDOL) 5 MG tablet Take 5 mg by mouth 2 (two) times daily.     metFORMIN (GLUCOPHAGE) 500 MG tablet Take 1,000 mg by mouth 2 (two) times daily.     mirtazapine  (REMERON) 30 MG tablet Take 60 mg by mouth at bedtime.     pantoprazole (PROTONIX) 40 MG tablet Take 1 tablet by mouth daily.     propranolol (INDERAL) 10 MG tablet Take 1 tablet (10 mg total) by mouth 2 (two) times daily. 60 tablet 2   QUEtiapine (SEROQUEL) 400 MG tablet Take 2 tablets (800 mg total) by mouth at bedtime. 30 tablet 2   albuterol (VENTOLIN HFA) 108 (90 Base) MCG/ACT inhaler Inhale into the lungs every 6 (six) hours as needed for wheezing or shortness of breath. (Patient not taking: Reported on 10/14/2021)     cyclobenzaprine (FLEXERIL) 10 MG tablet Take 10 mg by mouth at bedtime. (Patient not taking: Reported on 10/14/2021)     meloxicam (MOBIC) 7.5 MG tablet Take 7.5 mg by mouth daily. (Patient not taking: Reported on 10/14/2021)     naltrexone (DEPADE) 50 MG tablet Take 1 tablet (50 mg total) by mouth daily. (Patient not taking: Reported on 10/14/2021) 30 tablet 2   pregabalin (LYRICA) 75 MG capsule Take 75 mg by mouth 2 (two) times daily. (Patient not taking: Reported on 10/14/2021)      Musculoskeletal: Strength & Muscle Tone: within normal limits Gait & Station: normal Patient leans: N/A  Psychiatric Specialty Exam:  Presentation  General Appearance: Casual  Eye Contact:Fair  Speech:Clear and Coherent  Speech Volume:Normal  Handedness:Right   Mood and Affect  Mood:Angry  Affect:Blunt   Thought Process  Thought Processes:Goal Directed  Descriptions of Associations:Intact  Orientation:Full (Time, Place and Person)  Thought Content:Tangential  History of Schizophrenia/Schizoaffective disorder:No  Duration of Psychotic Symptoms:No data recorded Hallucinations:Hallucinations: None  Ideas of Reference:None  Suicidal Thoughts:Suicidal Thoughts: Yes, Passive SI Passive Intent and/or Plan: Without Intent  Homicidal Thoughts:Homicidal Thoughts: No   Sensorium  Memory:Immediate Fair; Recent Fair; Remote  [place]   Executive Functions  Concentration:Fair  Attention Span:Fair  [place]   Psychomotor Activity  Psychomotor Activity:Psychomotor Activity: Mannerisms   Assets  Assets:Resilience; Data processing manager; Physical Health  Sleep  Sleep:Sleep: Good    Physical Exam: Physical Exam Vitals and nursing note reviewed.  Constitutional:      Appearance: He is normal weight.  HENT:     Head: Normocephalic.     Nose: Nose normal.     Mouth/Throat:     Mouth: Mucous membranes are moist.     Pharynx: Oropharynx is clear.  Eyes:     Pupils: Pupils are equal, round, and reactive to light.  Cardiovascular:     Rate and Rhythm: Normal rate.     Pulses: Normal pulses.  Pulmonary:     Effort: Pulmonary effort is normal.  Abdominal:     Palpations: Abdomen is soft.  Musculoskeletal:        General: Normal range of motion.     Cervical back: Normal range of motion.  Skin:    General: Skin is warm and dry.  Neurological:     Mental Status: He is alert and oriented to person, place, and time.  Psychiatric:        Attention and Perception: Attention and perception normal.        Mood and Affect: Affect is labile and angry.        Speech: Speech is rapid and pressured.        Behavior: Behavior is uncooperative.        Thought Content: Thought content includes suicidal ideation.        Judgment: Judgment is impulsive.    Review of Systems  Psychiatric/Behavioral:  Positive for substance abuse and suicidal ideas.   All other systems reviewed and are negative.  Blood pressure (!) 126/91, pulse 86, temperature 98.2 F (36.8 C), resp. rate 18, height 6\' 2"  (1.88 m), weight 90.3 kg, SpO2 95 %. Body mass index is 25.56 kg/m.  Treatment Plan Summary: Daily contact with patient to assess and evaluate symptoms and progress in treatment, Medication management, and Plan restart medications, pt to remain on  continuous observation and will be re-evaluated by psychiatry in the morning.   Disposition: Patient does not meet criteria for psychiatric inpatient admission. Supportive therapy provided about ongoing stressors. Discussed crisis plan, support from social network, calling 911, coming to the Emergency Department, and calling Suicide Hotline.  This service was provided via telemedicine using a 2-way, interactive audio and video technology.  Names of all persons participating in this telemedicine service and their role in this encounter. Name: Oneida Alar Role: PMHNP  Name: Hampton Abbot Role: Attending MD  Name: Rosemarie Ax Role: patient  Name: Darius Role: [facility] ACTT Team    Inda Merlin, NP 10/14/2021 1:19 PM

## 2021-10-15 DIAGNOSIS — F1994 Other psychoactive substance use, unspecified with psychoactive substance-induced mood disorder: Secondary | ICD-10-CM | POA: Diagnosis not present

## 2021-10-15 NOTE — ED Notes (Signed)
Pt arguing with mother on the phone about being d/c home and needing a ride. Pt now off the phone with mother and very agitated demeanor.

## 2021-10-15 NOTE — ED Provider Notes (Signed)
Emergency Medicine Observation Re-evaluation Note  Michael Schmitt is a 51 y.o. male, seen on rounds today.  Pt initially presented to the ED for complaints of V70.1 Currently, the patient is asleep.  Physical Exam  BP 106/60 (BP Location: Right Arm)   Pulse 83   Temp 98.5 F (36.9 C) (Oral)   Resp 16   Ht 6\' 2"  (1.88 m)   Wt 90.3 kg   SpO2 97%   BMI 25.56 kg/m  Physical Exam General: asleep Cardiac: deferred Lungs: deferred Psych: deferred  ED Course / MDM  EKG:   I have reviewed the labs performed to date as well as medications administered while in observation.  Recent changes in the last 24 hours include psychiatric medicines ordered.  Plan  Current plan is for reassessment in AM.  Michael Schmitt is not under involuntary commitment.     Sherwood Gambler, MD 10/15/21 [phone]

## 2021-10-15 NOTE — Consult Note (Signed)
Telepsych Consultation    Reason for Consult: Substance abuse mood disorder Referring Physician:  Kennith Center  Location of Patient: A15 Location of Provider: Other: [facility] urgent care   Patient Identification: Michael Schmitt MRN:  [id number] Principal Diagnosis: Substance induced mood disorder ([place]) Diagnosis:  Principal Problem:   Substance induced mood disorder ([place])     Total Time spent with patient: 15 minutes   Subjective:   Michael Schmitt is a 51 y.o. male was seen and evaluated via telemetry assessment.  He is awake, alert and oriented x3.  Currently denying suicidal or homicidal ideations.  Denies auditory visual hallucinations.  States he was feeling depressed and suicidal a few days ago and attempted to seek help at [facility] however states he was not provided with any relief.     He reports he needs a longer treatment program" more than 5 days."  Discussed following up with [facility] for residential treatment services.  He was receptive to plan.  Patient asked for transportation to that facility.     Discussed that this provider will follow-up with social worker for additional outpatient resources for additional substance abuse programs.     Chart reviewed UDS positive for amphetamines 7/7 he was +benzodiazepine.  Patient reports he was recently discharged from [facility].  States he has been taking his medication as indicated.Michael Schmitt reports he is followed by [facility] where he is prescribed Lexapro, Depakote and Remeron.  Reports he has been medication compliant.   HPI: Per admission assessment note- "Michael Schmitt is a 51 y.o. male patient admitted with psych consult."I took an overdose on some Suboxone. Last night. 3 of them about 8'o clock (at night). Yeah I'd like to go back to [place]". Has [facility] ACTT services; states he "no longer has them, I cursed them out today. I need to go back to [facility]".        Past  Psychiatric History:   Risk to Self:   Risk to Others:   Prior Inpatient Therapy:   Prior Outpatient Therapy:     Past Medical History:      Past Medical History:  Diagnosis Date   Anxiety     Anxiety     Arthritis      cerv. stenosis, spondylosis, HNP- lower back , has been followed in pain clinic, has  had injection s in cerv. 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 Cervical three-four.   CARDIAC CATHETERIZATION N/A 01/01/2015    Procedure: Left Heart Cath and Coronary Angiography;  Surgeon: [name], MD;  Location: [place] CV LAB;  Service: Cardiovascular;  Laterality: N/A;   CARDIAC CATHETERIZATION N/A 04/05/2016    Procedure: Left Heart Cath and Coronary Angiography;  Surgeon: Belva Crome, MD;  Location: [place] CV LAB;  Service: Cardiovascular;  Laterality: N/A;   CARDIAC CATHETERIZATION N/A 04/05/2016    Procedure: Coronary Stent Intervention;  Surgeon: Belva Crome, MD;  Location: [place] CV LAB;  Service: Cardiovascular;  Laterality: N/A;   CARDIAC CATHETERIZATION N/A 04/05/2016    Procedure: Intravascular Ultrasound/IVUS;  Surgeon: Belva Crome, MD;  Location: [place] CV LAB;  Service: Cardiovascular;  Laterality: N/A;   NASAL SINUS SURGERY        2005    Family History:       Family History  Problem Relation Age of Onset   Prostate cancer Father     Hypertension Mother     Kidney Stones Mother     Anxiety disorder Mother     Depression Mother     COPD Sister     Hypertension Sister     Diabetes Sister     Depression Sister     Anxiety disorder Sister     Seizures Sister     ADD / ADHD Son     ADD / ADHD Daughter      Family Psychiatric  History:  Social History:  Social History        Substance and Sexual Activity  Alcohol Use No   Alcohol/week: 0.0 standard drinks of alcohol    Comment: last drinked in 3 months.      Social History       Substance and Sexual Activity  Drug Use No    Social History         Socioeconomic History   Marital status: Single      Spouse name: Not on file   Number of children: 3   Years of education: 12   Highest education level: Not on file  Occupational History      Comment: unemployed  Tobacco Use   Smoking status: Every Day      Packs/day: 2.00      Years: 17.00      Total pack years: 34.00      Types: Cigarettes, Cigars   Smokeless tobacco: Former   Tobacco comments:      occassional snuff   Vaping Use   Vaping Use: Never used  Substance and Sexual Activity   Alcohol use: No      Alcohol/week: 0.0 standard drinks of alcohol      Comment: last drinked in 3 months.    Drug use: No   Sexual activity: Not Currently  Other Topics Concern   Not on file  Social History Narrative    Patient is right handed.    Patient drinks 2 sodas daily.    Social Determinants of Health    Financial Resource Strain: Not on file  Food Insecurity: Not on file  Transportation  Needs: Not on file  Physical Activity: Not on file  Stress: Not on file  Social Connections: Not on file    Additional Social History:  Allergies:        Allergies  Allergen Reactions   Prednisone Other (See Comments)      Hypertension and makes him feel "spacey"   Amoxicillin Other (See Comments)      Other Reaction: ELEVATED BP   Hydrocodone Other (See Comments)      Causes headaches and makes the patient irritable   Varenicline Other (See Comments)      Suicidal thoughts   Other Anxiety      Other reaction(s): Other (See Comments) Patient stated that this keeps him awake; "has an opposite effect on me"   Trazodone And Nefazodone Anxiety and Other (See Comments)      Patient stated that this keeps him awake; "has an opposite effect on me"      Labs: No results found for this or any previous visit (from the past 48 hour(s)).   Medications:  No current facility-administered medications for this encounter.          Current Outpatient Medications  Medication Sig Dispense Refill   acamprosate (CAMPRAL) 333 MG tablet Take 666 mg by mouth 3 (three) times daily with meals.       atorvastatin (LIPITOR) 20 MG tablet Take 20 mg by mouth daily.       carvedilol (COREG) 6.25 MG tablet Take 6.25 mg by mouth 2 (two) times daily with a meal.       doxepin (SINEQUAN) 25 MG capsule Take 25 mg by mouth.       escitalopram (LEXAPRO) 10 MG tablet Take 1 tablet (10 mg total) by mouth daily. 30 tablet 2   glipiZIDE (GLUCOTROL XL) 10 MG 24 hr tablet Take 10 mg by mouth daily with breakfast.       metFORMIN (GLUCOPHAGE) 500 MG tablet Take 1,000 mg by mouth 2 (two) times daily.       pantoprazole (PROTONIX) 40 MG tablet Take 1 tablet by mouth daily.       propranolol (INDERAL) 10 MG tablet Take 1 tablet (10 mg total) by mouth 2 (two) times daily. 60 tablet 2   QUEtiapine (SEROQUEL) 400 MG tablet Take 2 tablets (800 mg total) by mouth at bedtime. 30 tablet 2   amLODipine (NORVASC) 5 MG tablet  Take 5 mg by mouth daily.       chlorproMAZINE (THORAZINE) 200 MG tablet Take 1 tablet by mouth in the morning, at noon, and at bedtime.       clopidogrel (PLAVIX) 75 MG tablet Take 75 mg by mouth daily.       divalproex (DEPAKOTE) 500 MG DR tablet Take 1,500 mg by mouth at bedtime.       gabapentin (NEURONTIN) 300 MG capsule Take 300 mg by mouth 3 (three) times daily.       haloperidol (HALDOL) 5 MG tablet Take 5 mg by mouth 2 (two) times daily.       mirtazapine (REMERON) 15 MG tablet Take 15 mg by mouth at bedtime.                 Facility-Administered Medications Ordered in Other Encounters  Medication Dose Route Frequency Provider Last Rate Last Admin   chlorproMAZINE (THORAZINE) tablet 25 mg  25 mg Oral TID Leevy-Johnson, Brooke A, NP   25 mg at 10/15/21 0934   divalproex (DEPAKOTE ER) 24 hr tablet 2,000 mg  2,000 mg Oral Daily Leevy-Johnson, Brooke A, NP   2,000 mg at 10/15/21 0935   gabapentin (NEURONTIN) capsule 300 mg  300 mg Oral  TID Leevy-Johnson, Brooke A, NP   300 mg at 10/15/21 0935   hydrOXYzine (ATARAX) tablet 50 mg  50 mg Oral TID PRN Leevy-Johnson, Brooke A, NP   50 mg at 10/15/21 0947      Musculoskeletal: Strength & Muscle Tone: within normal limits Gait & Station: normal Patient leans: N/A                   Psychiatric Specialty Exam:   Presentation  General Appearance: Casual   Eye Contact:Fair   Speech:Clear and Coherent   Speech Volume:Normal   Handedness:Right     Mood and Affect  Mood:Angry   Affect:Blunt     Thought Process  Thought Processes:Goal Directed   Descriptions of Associations:Intact   Orientation:Full (Time, Place and Person)   Thought Content:Tangential   History of Schizophrenia/Schizoaffective disorder:No   Duration of Psychotic Symptoms:No data recorded Hallucinations:Hallucinations: None   Ideas of Reference:None   Suicidal Thoughts:Suicidal Thoughts: Yes, Passive SI Passive Intent and/or Plan: Without  Intent   Homicidal Thoughts:Homicidal Thoughts: No     Sensorium  Memory:Immediate Fair; Recent Fair; Remote [place]     Executive Functions  Concentration:Fair   Attention Span:Fair   [place]     Psychomotor Activity  Psychomotor Activity:Psychomotor Activity: Mannerisms     Assets  Assets:Resilience; Social Support; Physical Health     Sleep  Sleep:Sleep: Good       Physical Exam: Physical Exam Vitals and nursing note reviewed.  Cardiovascular:     Rate and Rhythm: Normal rate and regular rhythm.     Pulses: Normal pulses.  Musculoskeletal:        General: Normal range of motion.  Psychiatric:        Attention and Perception: Attention normal.        Mood and Affect: Mood normal.        Speech: Speech normal.        Behavior: Behavior normal.        Thought Content: Thought content normal.        Cognition and Memory: Cognition normal.        Judgment: Judgment normal.      Review of Systems  Psychiatric/Behavioral:  Positive for depression and substance abuse. The patient is nervous/anxious.   All other systems reviewed and are negative.   Blood pressure (!) 146/80, pulse 82, temperature 97.9 F (36.6 C), temperature source Oral, resp. rate 20, height 6\' 2"  (1.88 m), weight 90.3 kg, SpO2 97 %. Body mass index is 25.55 kg/m.   Treatment Plan Summary: Daily contact with patient to assess and evaluate symptoms and progress in treatment and Medication management   Patient is seeking long-term residential treatment services Discussed following up with [facility] for 28-day program   Disposition: No evidence of imminent risk to self or others at present.   Patient does not meet criteria for psychiatric inpatient admission. Supportive therapy provided about ongoing stressors. Refer to IOP. Discussed crisis plan, support from social network, calling 911, coming to the  Emergency Department, and calling Suicide Hotline.   This service was provided via telemedicine using a 2-way, interactive audio and video technology.   Names of all persons participating in this telemedicine service and their role in this encounter. Name: Montel Vanderhoof Role: patient   Name: Ricky Ala  Role: NP  Derrill Center, NP 10/15/2021 2:33 PM

## 2021-10-15 NOTE — Consult Note (Cosign Needed Addendum)
Telepsych Consultation   Reason for Consult: Substance abuse mood disorder Referring Physician:  Kennith Center  Location of Patient: A15 Location of Provider: Other: [facility] urgent care  Patient Identification: Michael Schmitt MRN:  [id number] Principal Diagnosis: Substance induced mood disorder ([place]) Diagnosis:  Principal Problem:   Substance induced mood disorder ([place])   Total Time spent with patient: 15 minutes  Subjective:   Michael Schmitt is a 51 y.o. male was seen and evaluated via telemetry assessment.  He is awake, alert and oriented x3.  Currently denying suicidal or homicidal ideations.  Denies auditory visual hallucinations.  States he was feeling depressed and suicidal a few days ago and attempted to seek help at [facility] however states he was not provided with any relief.    He reports he needs a longer treatment program" more than 5 days."  Discussed following up with [facility] for residential treatment services.  He was receptive to plan.  Patient asked for transportation to that facility.    Discussed that this provider will follow-up with social worker for additional outpatient resources for additional substance abuse programs.    Chart reviewed UDS positive for amphetamines 7/7 he was +benzodiazepine.  Patient reports he was recently discharged from [facility].  States he has been taking his medication as indicated.[name] reports he is followed by [facility] where he is prescribed Lexapro, Depakote and Remeron.  Reports he has been medication compliant.  HPI: Per admission assessment note- "Michael Schmitt is a 51 y.o. male patient admitted with psych consult."I took an overdose on some Suboxone. Last night. 3 of them about 8'o clock (at night). Yeah I'd like to go back to [place]". Has [facility] ACTT services; states he "no longer has them, I cursed them out today. I need to go back to [facility]".      Past Psychiatric  History:  Risk to Self:   Risk to Others:   Prior Inpatient Therapy:   Prior Outpatient Therapy:    Past Medical History:  Past Medical History:  Diagnosis Date   Anxiety    Anxiety    Arthritis    cerv. stenosis, spondylosis, HNP- lower back , has been followed in pain clinic, has  had injection s in cerv. 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 Cervical three-four.   CARDIAC CATHETERIZATION N/A 01/01/2015   Procedure: Left Heart  Cath and Coronary Angiography;  Surgeon: [name], MD;  Location: [place] CV LAB;  Service: Cardiovascular;  Laterality: N/A;   CARDIAC CATHETERIZATION N/A 04/05/2016   Procedure: Left Heart Cath and Coronary Angiography;  Surgeon: Belva Crome, MD;  Location: [place] CV LAB;  Service: Cardiovascular;  Laterality: N/A;   CARDIAC CATHETERIZATION N/A 04/05/2016   Procedure: Coronary Stent Intervention;  Surgeon: Belva Crome, MD;  Location: [place] CV LAB;  Service:  Cardiovascular;  Laterality: N/A;   CARDIAC CATHETERIZATION N/A 04/05/2016   Procedure: Intravascular Ultrasound/IVUS;  Surgeon: Belva Crome, MD;  Location: [place] CV LAB;  Service: Cardiovascular;  Laterality: N/A;   NASAL SINUS SURGERY     2005   Family History:  Family History  Problem Relation Age of Onset   Prostate cancer Father    Hypertension Mother    Kidney Stones Mother    Anxiety disorder Mother    Depression Mother    COPD Sister    Hypertension Sister    Diabetes Sister    Depression Sister    Anxiety disorder Sister    Seizures Sister    ADD / ADHD Son    ADD / ADHD Daughter    Family Psychiatric  History:  Social History:  Social History   Substance and Sexual Activity  Alcohol Use No   Alcohol/week: 0.0 standard drinks of alcohol   Comment: last drinked in 3 months.      Social History   Substance and Sexual Activity  Drug Use No    Social History   Socioeconomic History   Marital status: Single    Spouse name: Not on file   Number of children: 3   Years of education: 12   Highest education level: Not on file  Occupational History    Comment: unemployed  Tobacco Use   Smoking status: Every Day    Packs/day: 2.00    Years: 17.00    Total pack years: 34.00    Types: Cigarettes, Cigars   Smokeless tobacco: Former   Tobacco comments:    occassional snuff   Vaping Use   Vaping Use: Never used  Substance and Sexual Activity   Alcohol use: No    Alcohol/week: 0.0 standard drinks of alcohol    Comment: last drinked in 3 months.    Drug use: No   Sexual activity: Not Currently  Other Topics Concern   Not on file  Social History Narrative   Patient is right handed.   Patient drinks 2 sodas daily.   Social Determinants of Health   Financial Resource Strain: Not on file  Food Insecurity: Not on file  Transportation Needs: Not on file  Physical Activity: Not on file  Stress: Not on file  Social Connections: Not on file    Additional Social History:    Allergies:   Allergies  Allergen Reactions   Prednisone Other (See Comments)    Hypertension and makes him feel "spacey"   Amoxicillin Other (See Comments)    Other Reaction: ELEVATED BP   Hydrocodone Other (See Comments)    Causes headaches and makes the patient irritable   Varenicline Other (See Comments)    Suicidal thoughts   Other Anxiety    Other reaction(s): Other (See Comments) Patient stated that this keeps him awake; "has an opposite effect on me"   Trazodone And Nefazodone Anxiety and Other (See Comments)    Patient stated that this keeps him awake; "has an opposite effect  on me"    Labs: No results found for this or any previous visit (from the past 48 hour(s)).  Medications:  No current facility-administered medications for this encounter.   Current Outpatient Medications  Medication Sig Dispense Refill   acamprosate (CAMPRAL) 333 MG tablet Take 666 mg by mouth 3 (three) times daily with meals.     atorvastatin (LIPITOR) 20 MG tablet Take 20 mg by mouth daily.     carvedilol (COREG) 6.25 MG tablet Take 6.25 mg by mouth 2 (two) times daily with a meal.     doxepin (SINEQUAN) 25 MG capsule Take 25 mg by mouth.     escitalopram (LEXAPRO) 10 MG tablet Take 1 tablet (10 mg total) by mouth daily. 30 tablet 2   glipiZIDE (GLUCOTROL XL) 10 MG 24 hr tablet Take 10 mg by mouth daily with breakfast.     metFORMIN (GLUCOPHAGE) 500 MG tablet Take 1,000 mg by mouth 2 (two) times daily.     pantoprazole (PROTONIX) 40 MG tablet Take 1 tablet by mouth daily.     propranolol (INDERAL) 10 MG tablet Take 1 tablet (10 mg total) by mouth 2 (two) times daily. 60 tablet 2   QUEtiapine (SEROQUEL) 400 MG tablet Take 2 tablets (800 mg total) by mouth at bedtime. 30 tablet 2   amLODipine (NORVASC) 5 MG tablet Take 5 mg by mouth daily.     chlorproMAZINE (THORAZINE) 200 MG tablet Take 1 tablet by mouth in the morning, at noon, and at bedtime.     clopidogrel  (PLAVIX) 75 MG tablet Take 75 mg by mouth daily.     divalproex (DEPAKOTE) 500 MG DR tablet Take 1,500 mg by mouth at bedtime.     gabapentin (NEURONTIN) 300 MG capsule Take 300 mg by mouth 3 (three) times daily.     haloperidol (HALDOL) 5 MG tablet Take 5 mg by mouth 2 (two) times daily.     mirtazapine (REMERON) 15 MG tablet Take 15 mg by mouth at bedtime.     Facility-Administered Medications Ordered in Other Encounters  Medication Dose Route Frequency Provider Last Rate Last Admin   chlorproMAZINE (THORAZINE) tablet 25 mg  25 mg Oral TID Leevy-Johnson, Brooke A, NP   25 mg at 10/15/21 0934   divalproex (DEPAKOTE ER) 24 hr tablet 2,000 mg  2,000 mg Oral Daily Leevy-Johnson, Brooke A, NP   2,000 mg at 10/15/21 0935   gabapentin (NEURONTIN) capsule 300 mg  300 mg Oral TID Leevy-Johnson, Brooke A, NP   300 mg at 10/15/21 0935   hydrOXYzine (ATARAX) tablet 50 mg  50 mg Oral TID PRN Leevy-Johnson, Brooke A, NP   50 mg at 10/15/21 2637    Musculoskeletal: Strength & Muscle Tone: within normal limits Gait & Station: normal Patient leans: N/A          Psychiatric Specialty Exam:  Presentation  General Appearance: Casual  Eye Contact:Fair  Speech:Clear and Coherent  Speech Volume:Normal  Handedness:Right   Mood and Affect  Mood:Angry  Affect:Blunt   Thought Process  Thought Processes:Goal Directed  Descriptions of Associations:Intact  Orientation:Full (Time, Place and Person)  Thought Content:Tangential  History of Schizophrenia/Schizoaffective disorder:No  Duration of Psychotic Symptoms:No data recorded Hallucinations:Hallucinations: None  Ideas of Reference:None  Suicidal Thoughts:Suicidal Thoughts: Yes, Passive SI Passive Intent and/or Plan: Without Intent  Homicidal Thoughts:Homicidal Thoughts: No   Sensorium  Memory:Immediate Fair; Recent Fair; Remote [place]  Judgment:Fair  Insight:Lacking   Executive Functions   Concentration:Fair  Attention Span:Fair  Recall:Fair  Fund of Knowledge:Fair  Language:Fair   Psychomotor Activity  Psychomotor Activity:Psychomotor Activity: Mannerisms   Assets  Assets:Resilience; Social Support; Physical Health   Sleep  Sleep:Sleep: Good    Physical Exam: Physical Exam Vitals and nursing note reviewed.  Cardiovascular:     Rate and Rhythm: Normal rate and regular rhythm.     Pulses: Normal pulses.  Musculoskeletal:        General: Normal range of motion.  Psychiatric:        Attention and Perception: Attention normal.        Mood and Affect: Mood normal.        Speech: Speech normal.        Behavior: Behavior normal.        Thought Content: Thought content normal.        Cognition and Memory: Cognition normal.        Judgment: Judgment normal.    Review of Systems  Psychiatric/Behavioral:  Positive for depression and substance abuse. The patient is nervous/anxious.   All other systems reviewed and are negative.  Blood pressure (!) 146/80, pulse 82, temperature 97.9 F (36.6 C), temperature source Oral, resp. rate 20, height 6\' 2"  (1.88 m), weight 90.3 kg, SpO2 97 %. Body mass index is 25.55 kg/m.  Treatment Plan Summary: Daily contact with patient to assess and evaluate symptoms and progress in treatment and Medication management  Patient is seeking long-term residential treatment services Discussed following up with [facility] for 28-day program  Disposition: No evidence of imminent risk to self or others at present.   Patient does not meet criteria for psychiatric inpatient admission. Supportive therapy provided about ongoing stressors. Refer to IOP. Discussed crisis plan, support from social network, calling 911, coming to the Emergency Department, and calling Suicide Hotline.  This service was provided via telemedicine using a 2-way, interactive audio and video technology.  Names of all persons participating in this telemedicine  service and their role in this encounter. Name: Montravious Weigelt Role: patient   Name: Ricky Ala  Role: NP           Derrill Center, NP 10/15/2021 2:33 PM

## 2021-10-15 NOTE — ED Notes (Signed)
Spoke with sister, Olin Hauser, about transportation home upon d/c. Sister became angry on the phone and stated pt admitted to overdosing on his medication at home prior to coming to the ER. Sister stated there was still numerous pills in the bottles that the pt stated he overdosed on. I encouraged her to count the pills to see how many he had taken, she said she would "in a little while". She stated there was no way for him to get home and we needed to keep him here. I explained he did not meet admission criteria and could not stay in the ER when discharged. She stated if he was to leave and hurt himself, she was going to sue. Will try to find another type of transportation when d/c

## 2021-10-15 NOTE — ED Notes (Signed)
Waiting on the consult note for discharge for this patient. Note was placed in the chart and will discharge promptly. [facility] called and is currently not available to transport patient.

## 2021-10-15 NOTE — ED Notes (Signed)
At pt request, called son Braden to see if he can borrow money from a friend to get gas to come get him, or have a friend bring him to ER and pick up pt. Braden stated "I will see what I can do". Pt informed of conversation. Braden told pt would be waiting in ER lobby. Pt d/c to lobby

## 2021-10-15 NOTE — ED Notes (Signed)
Pt became anxious after speaking with Dr about being d/c home. Pt pacing in room and saying he needs help. Speaking with sitters states that if he goes home he does not feel safe with himself in home. States that if he goes home he feels as if he would hurt himself. Sitter asked pt why he did not inform the Dr of these feelings when Dr was present in the room- pt could not give a coherent answer. RN notified.

## 2021-10-15 NOTE — ED Provider Notes (Signed)
I discussed patient with Ricky Ala. She has cleared him psychiatrically. Patient appears stable. Is not psychotic. Will d/c home   Sherwood Gambler, MD 10/15/21 1419

## 2022-01-31 ENCOUNTER — Encounter: Payer: Self-pay | Admitting: Emergency Medicine

## 2022-01-31 ENCOUNTER — Emergency Department
Admission: EM | Admit: 2022-01-31 | Discharge: 2022-02-01 | Disposition: A | Discharge status: Other Acute Inpatient Hospital | Payer: Medicare Other | Attending: Emergency Medicine | Admitting: Emergency Medicine

## 2022-01-31 ENCOUNTER — Other Ambulatory Visit: Payer: Self-pay

## 2022-01-31 DIAGNOSIS — F12280 Cannabis dependence with cannabis-induced anxiety disorder: Secondary | ICD-10-CM | POA: Insufficient documentation

## 2022-01-31 DIAGNOSIS — F1914 Other psychoactive substance abuse with psychoactive substance-induced mood disorder: Secondary | ICD-10-CM | POA: Insufficient documentation

## 2022-01-31 DIAGNOSIS — F609 Personality disorder, unspecified: Secondary | ICD-10-CM | POA: Diagnosis not present

## 2022-01-31 DIAGNOSIS — Z7289 Other problems related to lifestyle: Secondary | ICD-10-CM | POA: Insufficient documentation

## 2022-01-31 DIAGNOSIS — F331 Major depressive disorder, recurrent, moderate: Secondary | ICD-10-CM | POA: Insufficient documentation

## 2022-01-31 DIAGNOSIS — F1310 Sedative, hypnotic or anxiolytic abuse, uncomplicated: Secondary | ICD-10-CM | POA: Diagnosis not present

## 2022-01-31 DIAGNOSIS — Y906 Blood alcohol level of 120-199 mg/100 ml: Secondary | ICD-10-CM | POA: Insufficient documentation

## 2022-01-31 DIAGNOSIS — R45851 Suicidal ideations: Secondary | ICD-10-CM | POA: Diagnosis not present

## 2022-01-31 DIAGNOSIS — Z1152 Encounter for screening for COVID-19: Secondary | ICD-10-CM | POA: Insufficient documentation

## 2022-01-31 DIAGNOSIS — F1010 Alcohol abuse, uncomplicated: Secondary | ICD-10-CM

## 2022-01-31 DIAGNOSIS — F1020 Alcohol dependence, uncomplicated: Secondary | ICD-10-CM | POA: Diagnosis present

## 2022-01-31 LAB — RESP PANEL BY RT-PCR (FLU A&B, COVID) ARPGX2
Influenza A by PCR: NEGATIVE
Influenza B by PCR: NEGATIVE
SARS Coronavirus 2 by RT PCR: NEGATIVE

## 2022-01-31 LAB — COMPREHENSIVE METABOLIC PANEL
ALT: 43 U/L (ref 0–44)
AST: 37 U/L (ref 15–41)
Albumin: 4.1 g/dL (ref 3.5–5.0)
Alkaline Phosphatase: 138 U/L — ABNORMAL HIGH (ref 38–126)
Anion gap: 10 (ref 5–15)
BUN: <5 mg/dL — ABNORMAL LOW (ref 6–20)
CO2: 21 mmol/L — ABNORMAL LOW (ref 22–32)
Calcium: 9.4 mg/dL (ref 8.9–10.3)
Chloride: 109 mmol/L (ref 98–111)
Creatinine, Ser: 0.99 mg/dL (ref 0.61–1.24)
GFR, Estimated: >60 mL/min (ref >60)
Glucose, Bld: 167 mg/dL — ABNORMAL HIGH (ref 70–99)
Potassium: 3.8 mmol/L (ref 3.5–5.1)
Sodium: 140 mmol/L (ref 135–145)
Total Bilirubin: 0.7 mg/dL (ref 0.3–1.2)
Total Protein: 7.2 g/dL (ref 6.5–8.1)

## 2022-01-31 LAB — SALICYLATE LEVEL: Salicylate Lvl: <7 mg/dL — ABNORMAL LOW (ref 7.0–30.0)

## 2022-01-31 LAB — ACETAMINOPHEN LEVEL: Acetaminophen (Tylenol), Serum: <10 ug/mL — ABNORMAL LOW (ref 10–30)

## 2022-01-31 LAB — CBC
HCT: 45.2 % (ref 39.0–52.0)
Hemoglobin: 15.3 g/dL (ref 13.0–17.0)
MCH: 30.8 pg (ref 26.0–34.0)
MCHC: 33.8 g/dL (ref 30.0–36.0)
MCV: 90.9 fL (ref 80.0–100.0)
Platelets: 320 10*3/uL (ref 150–400)
RBC: 4.97 MIL/uL (ref 4.22–5.81)
RDW: 14.5 % (ref 11.5–15.5)
WBC: 6 10*3/uL (ref 4.0–10.5)
nRBC: 0 % (ref 0.0–0.2)

## 2022-01-31 LAB — ETHANOL: Alcohol, Ethyl (B): 153 mg/dL — ABNORMAL HIGH (ref <10)

## 2022-01-31 MED ORDER — LORAZEPAM 2 MG PO TABS
0.0000 mg | ORAL_TABLET | Freq: Four times a day (QID) | ORAL | Status: DC
  Administered 2022-02-01: 1 mg via ORAL
  Filled 2022-01-31: qty 1

## 2022-01-31 MED ORDER — THIAMINE HCL 100 MG/ML IJ SOLN: 100.0000 mg | Freq: Every day | INTRAMUSCULAR | Status: DC

## 2022-01-31 MED ORDER — LORAZEPAM 1 MG PO TABS
1.0000 mg | ORAL_TABLET | Freq: Once | ORAL | Status: AC
  Administered 2022-01-31: 1 mg via ORAL
  Filled 2022-01-31: qty 1

## 2022-01-31 MED ORDER — THIAMINE MONONITRATE 100 MG PO TABS
100.0000 mg | ORAL_TABLET | Freq: Every day | ORAL | Status: DC
  Administered 2022-01-31 – 2022-02-01 (×2): 100 mg via ORAL
  Filled 2022-01-31 (×2): qty 1

## 2022-01-31 MED ORDER — LORAZEPAM 2 MG PO TABS: 0.0000 mg | ORAL_TABLET | Freq: Two times a day (BID) | ORAL | Status: DC

## 2022-01-31 MED ORDER — LORAZEPAM 2 MG/ML IJ SOLN: 0.0000 mg | Freq: Four times a day (QID) | INTRAMUSCULAR | Status: DC

## 2022-01-31 MED ORDER — LORAZEPAM 2 MG/ML IJ SOLN: 0.0000 mg | Freq: Two times a day (BID) | INTRAMUSCULAR | Status: DC

## 2022-01-31 MED ORDER — IBUPROFEN 600 MG PO TABS
600.0000 mg | ORAL_TABLET | Freq: Three times a day (TID) | ORAL | Status: DC | PRN
  Administered 2022-01-31: 600 mg via ORAL
  Filled 2022-01-31: qty 1

## 2022-01-31 NOTE — BH Assessment (Signed)
Patient has been accepted to [facility].  Patient assigned to [facility] Accepting physician is Dr. Jonelle Sports.  Call report to [phone].  Representative was [employer].   ER Staff is aware of it:  Carlene, ER Secretary  Dr. Starleen Blue, ER MD  Amy Patient's Nurse     Patient can arrive anytime after 8 AM 02/01/22.

## 2022-01-31 NOTE — ED Notes (Signed)
Pt asleep, VS not obtained at this time.

## 2022-01-31 NOTE — BH Assessment (Addendum)
Comprehensive Clinical Assessment (CCA) Note  01/31/2022 Michael Schmitt [id number]  Chief Complaint:  Recommendations for Services/Supports/Treatments: Consulted with Paris Lore D., NP, who explained that the pt. meets psychiatric inpatient criteria. Notified Dr. Starleen Blue and Amy, RN of disposition recommendation.  Michael Schmitt is a 51 year old., Caucasian, Non-Hispanic, English speaking male with a history of MDD, GAD, and Personality Disorder. Pt also has hx of polysubstance abuse. Per triage note: Pt to ER via EMS with c/o suicidal.  When asked if he has a plan pt states "yes", when asked what his plan is pt. states "I don't know".  This RN asks again if there is a plan and pt. states "I will hang myself."  When asked what is prompting his SI, pt. states "I don't know."  Pt continues to repeat, "I need help, how long is this going to take".  Upon assessment, Pt was adamant about having SI, explaining that his plan is to hang himself. Pt stated that he has been experiencing SI for over 2 weeks and has previously attempted. Pt identified his main stressor as chronic neck pain. When asked about his alcohol consumption, pt admitted to drinking daily. Pt rambled, "I don't know" when asked about the amount of alcohol he drinks. Pt reported that he lives with family and things are going ok. Pt has [facility] ACT [facility]. Pt had slurred, speech. Pt was guarded and not forthcoming throughout the assessment. Pt. had an angry irritable mood and a congruent affect. Pt had restless psychomotor activity. Pt denied HI/AV/H. Pt's BAL is 153; UDS pending.   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 Self  Referral name: No data recorded Referral phone number: No data recorded  Whom do you see for routine medical problems? No data recorded Practice/Facility Name: No data recorded Practice/Facility Phone Number: No data recorded Name of Contact: No data recorded Contact Number: No data recorded Contact Fax Number: No data recorded Prescriber Name: No data recorded Prescriber Address (if known): No data recorded  What Is the Reason for Your Visit/Call Today? Pt to ER via EMS with c/o suicidal.  When asked if he has a plan pt states "yes", when asked what his plan is pt states "I don't know".  This RN asks again if there is a plan and pt states "I will hang myself."  When asked what is prompting his SI, pt states "I don't know."  Pt continues to repeat, "I need help, how long is this going to take".  How Long Has This Been Causing You Problems? > than 6 months  What Do You Feel Would Help You the Most Today? Treatment for Depression or other mood problem   Have You Recently Been in Any Inpatient Treatment (Hospital/Detox/Crisis Center/28-Day Program)? No data recorded Name/Location of Program/Hospital:No data recorded How Long Were You There? No data recorded When Were You Discharged? No data recorded  Have You Ever Received Services From [facility] Before? No data recorded Who Do You See at [facility]? No data recorded  Have You Recently Had Any Thoughts About Hurting Yourself? Yes  Are You Planning to Commit Suicide/Harm Yourself At This time? Yes  Have you Recently Had Thoughts About [place]? No  Explanation: n/a   Have You Used Any Alcohol or Drugs in the Past 24 Hours? No  How Long Ago Did You Use Drugs or Alcohol? No data recorded What Did You Use and How Much? n/a   Do You Currently Have a Therapist/Psychiatrist? Yes  Name of Therapist/Psychiatrist: [facility]   Have You Been Recently  Discharged From Any Office Practice or Programs? No  Explanation of Discharge From Practice/Program: Discharged from [facility] in March 2023     CCA Screening Triage Referral Assessment Type of Contact: Face-to-Face  Is this Initial or Reassessment? Initial Assessment  Date Telepsych consult ordered in [facility]:  09/30/21  Time Telepsych consult ordered in [facility]:  1810   Patient Reported Information Reviewed? No data recorded Patient Left Without Being Seen? No data recorded Reason for Not Completing Assessment: No data recorded  Collateral Involvement: None   Does Patient Have a Court Appointed Legal Guardian? No data recorded Name and Contact of Legal Guardian: No data recorded If Minor and Not Living with Parent(s), Who has Custody? NA  Is CPS involved or ever been involved? Never  Is APS involved or ever been involved? Never   Patient Determined To Be At Risk for Harm To Self or Others Based on Review of Patient Reported Information or Presenting Complaint? Yes, for Self-Harm  Method: No Plan  Availability of Means: No access or NA  Intent: Clearly intends on inflicting harm that could cause death  Notification Required: No need or identified person  Additional Information for Danger to Others Potential: Previous attempts  Additional Comments for Danger to Others Potential: No data recorded Are There Guns or Other Weapons in Your Home? No  Types of Guns/Weapons: No data recorded Are These Weapons Safely Secured?                            No  Who Could Verify You Are Able To Have These Secured: No data recorded Do You Have any Outstanding Charges, Pending Court Dates, Parole/Probation? No data recorded Contacted To Inform of Risk of Harm To Self or Others: Other: Comment   Location of Assessment: [facility] ED   Does Patient Present under Involuntary Commitment? No  IVC Papers Initial File Date: No data recorded  [place] of Residence: [place]   Patient  Currently Receiving the Following Services: ACTT Architect); Medication Management   Determination of Need: Emergent (2 hours)   Options For Referral: Inpatient Hospitalization; Outpatient Therapy; Medication Management     CCA Biopsychosocial Intake/Chief Complaint:  No data recorded Current Symptoms/Problems: No data recorded  Patient Reported Schizophrenia/Schizoaffective Diagnosis in Past: No   Strengths: Family supports; stable housing; has fair insight  Preferences: No data recorded Abilities: No data recorded  Type of Services Patient Feels are Needed: No data recorded  Initial Clinical Notes/Concerns: No data recorded  Mental Health Symptoms Depression:   Change in energy/activity; Difficulty Concentrating; Hopelessness; Worthlessness; Irritability; Fatigue; Sleep (too much or little)   Duration of Depressive symptoms:  Greater than two weeks   Mania:   None   Anxiety:    Difficulty concentrating; Irritability; Restlessness; Tension; Worrying   Psychosis:   None   Duration of Psychotic symptoms: No data recorded  Trauma:   None   Obsessions:   None   Compulsions:   "Driven" to perform behaviors/acts; Good insight; Intended to reduce stress or prevent  another outcome; Disrupts with routine/functioning; Repeated behaviors/mental acts; Intrusive/time consuming   Inattention:   None   Hyperactivity/Impulsivity:   None   Oppositional/Defiant Behaviors:   Easily annoyed; Argumentative   Emotional Irregularity:   Recurrent suicidal behaviors/gestures/threats; Potentially harmful impulsivity   Other Mood/Personality Symptoms:   Pt's medical record indicates diagnosis of personality disorder    Mental Status Exam Appearance and self-care  Stature:   Tall   Weight:   Average weight   Clothing:   Casual   Grooming:   Neglected   Cosmetic use:   None   Posture/gait:   Normal   Motor activity:   Restless    Sensorium  Attention:   Normal   Concentration:   Anxiety interferes   Orientation:   X5   Recall/memory:   Normal   Affect and Mood  Affect:   Depressed; Negative   Mood:   Depressed; Irritable; Negative   Relating  Eye contact:   Fleeting   Facial expression:   Depressed   Attitude toward examiner:   Cooperative; Irritable   Thought and Language  Speech flow:  Other (Comment)   Thought content:   Appropriate to Mood and Circumstances   Preoccupation:   None   Hallucinations:   None   Organization:  No data recorded  [employer] of Knowledge:   Average   Intelligence:   Average   Abstraction:   Normal   Judgement:   Fair   Reality Testing:   Adequate   Insight:   Fair   Decision Making:   Impulsive   Social Functioning  Social Maturity:   Impulsive   Social Judgement:   Heedless   Stress  Stressors:   Other (Comment)   Coping Ability:   Exhausted   Skill Deficits:   Decision making   Supports:   Family; Friends/Service system     Religion: Religion/Spirituality Are You A Religious Person?: Yes What is Your Religious Affiliation?: Christian How Might This Affect Treatment?: NA  Leisure/Recreation: Leisure / Recreation Do You Have Hobbies?: No  Exercise/Diet: Exercise/Diet Do You Exercise?: Yes What Type of Exercise Do You Do?: Run/Walk How Many Times a Week Do You Exercise?: 1-3 times a week Have You Gained or Lost A Significant Amount of Weight in the Past Six Months?: No Do You Follow a Special Diet?: No Do You Have Any Trouble Sleeping?: No   CCA Employment/Education Employment/Work Situation: Employment / Work Situation Employment Situation: On disability Why is Patient on Disability: Per pt, "neck pain and depression." How Long has Patient Been on Disability: Since 2015. Patient's Job has Been Impacted by Current Illness: Yes Describe how Patient's Job has Been Impacted: Pt is  unable to work.  Has Patient ever Been in the [place]?: No  Education: Education Is Patient Currently Attending School?: No Last Grade Completed: 11 Did You Attend College?: No Did You Have An Individualized Education Program (IIEP): No Did You Have Any Difficulty At School?: No Patient's Education Has Been Impacted by Current Illness: No   CCA Family/Childhood History Family and Relationship History: Family history Marital status: Single Does patient have children?: Yes How many children?: 3 How is patient's relationship with their children?: Pt reports, two of his children live with him and his sister and that he has a good relationship with his children.  Childhood History:  Childhood History By whom was/is the patient raised?: Both parents Did patient suffer any verbal/emotional/physical/sexual abuse as a child?: No Did patient suffer  from severe childhood neglect?: No Has patient ever been sexually abused/assaulted/raped as an adolescent or adult?: No Was the patient ever a victim of a crime or a disaster?: No Witnessed domestic violence?: No Has patient been affected by domestic violence as an adult?: No  Child/Adolescent Assessment:     CCA Substance Use Alcohol/Drug Use: Alcohol / Drug Use Pain Medications: See MAR Prescriptions: See MAR Over the Counter: See MAR History of alcohol / drug use?: Yes Longest period of sobriety (when/how long): Unknown Withdrawal Symptoms: None                         ASAM's:  Six Dimensions of Multidimensional Assessment  Dimension 1:  Acute Intoxication and/or Withdrawal Potential:   Dimension 1:  Description of individual's past and current experiences of substance use and withdrawal: Pt reported daily alcohol use; amount unknown  Dimension 2:  Biomedical Conditions and Complications:      Dimension 3:  Emotional, Behavioral, or Cognitive Conditions and Complications:     Dimension 4:  Readiness to Change:      Dimension 5:  Relapse, Continued use, or Continued Problem Potential:     Dimension 6:  Recovery/Living Environment:     ASAM Severity Score: ASAM's Severity Rating Score: 16  ASAM Recommended Level of Treatment: ASAM Recommended Level of Treatment: Level II Intensive Outpatient Treatment   Substance use Disorder (SUD) Substance Use Disorder (SUD)  Checklist Symptoms of Substance Use: Continued use despite having a persistent/recurrent physical/psychological problem caused/exacerbated by use  Recommendations for Services/Supports/Treatments: Recommendations for Services/Supports/Treatments Recommendations For Services/Supports/Treatments: ACCTT (Assertive Community Treatment), Medication Management  DSM5 Diagnoses: Patient Active Problem List   Diagnosis Date Noted   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   Drug-seeking behavior 02/25/2021   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[place]) 12/27/2019   Hyponatremia 01/24/2019   Substance induced mood disorder ([place]) 11/22/2018   Alcohol abuse with alcohol-induced mood disorder ([place]) 09/21/2018   Personality disorder ([place]) 12/31/2017   Major depressive disorder, recurrent episode, moderate (HCC) 03/10/2017   Generalized anxiety disorder 02/05/2017   Chronic neck pain (Primary Area of Pain) (Left) 11/22/2016   Long term prescription benzodiazepine use 11/21/2016   Opiate use 11/21/2016   Type 2 diabetes mellitus without complication, without long-term current use of insulin ([place]) 10/19/2016   Chronic Suicidal ideation 09/15/2016   Alcohol use disorder, severe, 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[place]) 06/26/2016   Benzodiazepine abuse ([place]) 06/11/2016   Michael Schmitt, LCAS

## 2022-01-31 NOTE — ED Notes (Signed)
First Nurse Note: Pt to ED via [facility], pt has not had his medication since Friday. Pt has a HA and told EMS when he got to triage that he was having suicidal thoughts.

## 2022-01-31 NOTE — ED Provider Notes (Signed)
[facility] Provider Note    None    (approximate)   History   Suicidal   HPI  Jacorion Klem is a 51 y.o. male past medical history of anxiety, bipolar disorder, restless leg syndrome who presents with SI.  Patient tells me he has a plan to hang himself.  Denies any specific stressors that are causing him to feel this way.  Tells me he takes Seroquel and Cymbalta for anxiety.  He drinks alcohol daily.  Last drink was today he cannot tell me exactly when and also cannot quantify exactly how much he is drinking per day.  Denies other drug use.  He endorses feeling restless and agitated and would like something to calm down.     Past Medical History:  Diagnosis Date   Anxiety    Anxiety    Arthritis    cerv. stenosis, spondylosis, HNP- lower back , has been followed in pain clinic, has  had injection s in cerv. 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   Mental disorder    MI, old    RLS (restless legs syndrome)    detected on sleep study   Shortness of breath    Ventricular fibrillation ([place]) 04/05/2016    Patient Active Problem List   Diagnosis Date Noted   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   Drug-seeking behavior 02/25/2021   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[place]) 12/27/2019   Hyponatremia 01/24/2019   Substance induced mood disorder ([place]) 11/22/2018   Alcohol abuse with alcohol-induced mood disorder ([place]) 09/21/2018   Personality disorder ([place])  12/31/2017   Major depressive disorder, recurrent episode, moderate ([place]) 03/10/2017   Generalized anxiety disorder 02/05/2017   Chronic neck pain (Primary Area of Pain) (Left) 11/22/2016   Long term prescription benzodiazepine use 11/21/2016   Opiate use 11/21/2016   Type 2 diabetes mellitus without complication, without long-term current use of insulin ([place]) 10/19/2016   Chronic Suicidal ideation 09/15/2016   Alcohol use disorder, severe, 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[place]) 06/26/2016   Benzodiazepine abuse ([place]) 06/11/2016     Physical Exam  Triage Vital Signs: ED Triage Vitals  Enc Vitals Group     BP 01/31/22 1736 (!) 153/83     Pulse Rate 01/31/22 1736 (!) 121     Resp 01/31/22 1736 18     Temp 01/31/22 1736 98.2 F (36.8 C)     Temp Source 01/31/22 1736 Oral     SpO2 01/31/22 1736 97 %     Weight 01/31/22 1737 197 lb (89.4 kg)     Height 01/31/22 1737 6\' 2"  (1.88 m)     Head Circumference --      Peak Flow --      Pain Score 01/31/22 1736 10     Pain Loc --      Pain Edu? --      Excl. in [place]? --  Most recent vital signs: Vitals:   01/31/22 1736 01/31/22 1755  BP: (!) 153/83 (!) 153/83  Pulse: (!) 121 (!) 121  Resp: 18   Temp: 98.2 F (36.8 C)   SpO2: 97%      General: Awake, patient is mildly agitated CV:  Good peripheral perfusion.  Resp:  Normal effort.  Abd:  No distention.  Neuro:             Awake, Alert, Oriented x 3  Other:  Patient is somewhat agitated frequently fidgeting and moving around, frequent lip movements Affect is flat, does not appear to be responding to internal stimuli   ED Results / Procedures / Treatments  Labs (all labs ordered are listed, but only abnormal results are displayed) Labs Reviewed  COMPREHENSIVE METABOLIC PANEL - Abnormal; Notable for the following components:      Result Value   CO2 21 (*)    Glucose, Bld 167 (*)    BUN <5 (*)    Alkaline Phosphatase 138 (*)    All other components within normal limits  RESP PANEL  BY RT-PCR (FLU A&B, COVID) ARPGX2  CBC  ETHANOL  SALICYLATE LEVEL  ACETAMINOPHEN LEVEL  URINE DRUG SCREEN, QUALITATIVE ([facility] ONLY)     EKG     RADIOLOGY    PROCEDURES:  Critical Care performed: No  Procedures   MEDICATIONS ORDERED IN ED: Medications  LORazepam (ATIVAN) injection 0-4 mg (0 mg Intravenous Hold 01/31/22 1756)    Or  LORazepam (ATIVAN) tablet 0-4 mg ( Oral See Alternative 01/31/22 1756)  LORazepam (ATIVAN) injection 0-4 mg (has no administration in time range)    Or  LORazepam (ATIVAN) tablet 0-4 mg (has no administration in time range)  thiamine (VITAMIN B1) tablet 100 mg (100 mg Oral Given 01/31/22 1805)    Or  thiamine (VITAMIN B1) injection 100 mg ( Intravenous See Alternative 01/31/22 1805)  ibuprofen (ADVIL) tablet 600 mg (has no administration in time range)  LORazepam (ATIVAN) tablet 1 mg (1 mg Oral Given 01/31/22 1805)     IMPRESSION / MDM / ASSESSMENT AND PLAN / ED COURSE  I reviewed the triage vital signs and the nursing notes.                              Patient's presentation is most consistent with acute presentation with potential threat to life or bodily function.  Differential diagnosis includes, but is not limited to, alcohol intoxication, major depressive episode, substance-induced mood disorder  Patient is a 51 year old male with history of bipolar disorder anxiety who presents because of suicidal ideation.  He does endorse significant alcohol use daily drinking but cannot quantify how much last drink was today sometime.  On arrival patient is tachycardic mildly hypertensive vitals are otherwise reassuring.  He looks somewhat agitated he is fidgeting frequently moving his mouth frequently.  Tells me he is suicidal and has a plan to hang himself.  He also complains of chronic neck and head pain but this is not an acute issue today.  Labs including CBC and CMP are reassuring.  Plan to give a dose of p.o. Ativan now because patient is  agitated.  Will place on CIWA.  I have consulted psychiatry and TTS.  We will keep voluntary as patient brought himself here and would like help we will have a low threshold to IVC if patient tries to leave.  The patient has been placed in psychiatric observation  due to the need to provide a safe environment for the patient while obtaining psychiatric consultation and evaluation, as well as ongoing medical and medication management to treat the patient's condition.  The patient has not been placed under full IVC at this time.        FINAL CLINICAL IMPRESSION(S) / ED DIAGNOSES   Final diagnoses:  Suicidal ideation  Alcohol abuse     Rx / DC Orders   ED Discharge Orders     None        Note:  This document was prepared using Dragon voice recognition software and may include unintentional dictation errors.   Rada Hay, MD 01/31/22 [phone]

## 2022-01-31 NOTE — ED Notes (Signed)
VOL/  PENDING  CONSULT 

## 2022-01-31 NOTE — BH Assessment (Addendum)
Referral information for Psychiatric Hospitalization faxed to;   [facility] [phone]),  [address] [phone]),   [facility] [phone] -or- [phone]),   [facility] [phone])  [facility] [phone])  [facility] [phone])

## 2022-01-31 NOTE — ED Triage Notes (Signed)
Pt to ER via EMS with c/o suicidal.  When asked if he has a plan pt states "yes", when asked what his plan is pt states "I don't know".  This RN asks again if there is a plan and pt states "I will hang myself."  When asked what is prompting his SI, pt states "I don't know."  Pt continues to repeat, "I need help, how long is this going to take".  Pt is extremely irritable and uncooperative with staff interview at this time.

## 2022-01-31 NOTE — ED Notes (Addendum)
Belongings [place] with $44 cash in pocket, wallet, blue underwear and tshirt. All belongings verified with Evelena Peat, [place] and transferred to [place], Therapist, sports in [employer]

## 2022-02-01 DIAGNOSIS — F1020 Alcohol dependence, uncomplicated: Secondary | ICD-10-CM | POA: Diagnosis not present

## 2022-02-01 LAB — URINE DRUG SCREEN, QUALITATIVE (ARMC ONLY)
Amphetamines, Ur Screen: NOT DETECTED
Barbiturates, Ur Screen: NOT DETECTED
Benzodiazepine, Ur Scrn: POSITIVE — AB
Cannabinoid 50 Ng, Ur ~~LOC~~: POSITIVE — AB
Cocaine Metabolite,Ur ~~LOC~~: NOT DETECTED
MDMA (Ecstasy)Ur Screen: NOT DETECTED
Methadone Scn, Ur: NOT DETECTED
Opiate, Ur Screen: NOT DETECTED
Phencyclidine (PCP) Ur S: NOT DETECTED
Tricyclic, Ur Screen: POSITIVE — AB

## 2022-02-01 LAB — CBG MONITORING, ED: Glucose-Capillary: 189 mg/dL — ABNORMAL HIGH (ref 70–99)

## 2022-02-01 MED ORDER — LIDOCAINE VISCOUS HCL 2 % MT SOLN
15.0000 mL | Freq: Once | OROMUCOSAL | Status: AC
  Administered 2022-02-01: 15 mL via ORAL
  Filled 2022-02-01: qty 15

## 2022-02-01 MED ORDER — GLIPIZIDE ER 10 MG PO TB24
10.0000 mg | ORAL_TABLET | Freq: Every day | ORAL | Status: DC
  Filled 2022-02-01: qty 1

## 2022-02-01 MED ORDER — ALUM & MAG HYDROXIDE-SIMETH 200-200-20 MG/5ML PO SUSP
30.0000 mL | Freq: Once | ORAL | Status: AC
  Administered 2022-02-01: 30 mL via ORAL
  Filled 2022-02-01: qty 30

## 2022-02-01 MED ORDER — ONDANSETRON 4 MG PO TBDP
4.0000 mg | ORAL_TABLET | Freq: Once | ORAL | Status: AC
  Administered 2022-02-01: 4 mg via ORAL
  Filled 2022-02-01: qty 1

## 2022-02-01 MED ORDER — METFORMIN HCL 500 MG PO TABS
1000.0000 mg | ORAL_TABLET | Freq: Two times a day (BID) | ORAL | Status: DC
  Administered 2022-02-01: 1000 mg via ORAL
  Filled 2022-02-01: qty 2

## 2022-02-01 MED ORDER — AMLODIPINE BESYLATE 5 MG PO TABS
5.0000 mg | ORAL_TABLET | Freq: Every day | ORAL | Status: DC
  Administered 2022-02-01: 5 mg via ORAL
  Filled 2022-02-01: qty 1

## 2022-02-01 NOTE — ED Notes (Signed)
Attempt to give report, at [phone]. Left HIPAA complaint return message.

## 2022-02-01 NOTE — ED Provider Notes (Signed)
Emergency Medicine Observation Re-evaluation Note  Michael Schmitt is a 51 y.o. male, seen on rounds today.  Pt initially presented to the ED for complaints of Suicidal  Currently, the patient is calm, no acute complaints.  Physical Exam  Blood pressure 125/81, pulse 99, temperature 98.2 F (36.8 C), temperature source Oral, resp. rate 16, height 6\' 2"  (1.88 m), weight 89.4 kg, SpO2 99 %. Physical Exam General: NAD Lungs: CTAB Psych: not agitated  ED Course / MDM  EKG:    I have reviewed the labs performed to date as well as medications administered while in observation.  Recent changes in the last 24 hours include no acute events overnight.  Seen by psychiatry, recommended for inpatient treatment and accepted to [facility] for transfer  Plan  Current plan is for transfer to inpatient psychiatry. Patient is not under full IVC at this time.   Carrie Mew, MD 02/01/22 [phone]

## 2022-02-01 NOTE — ED Notes (Signed)
Called [facility] to transport patient to [place]

## 2022-02-01 NOTE — ED Notes (Signed)
Sent 2 bags of belongings with transport service.

## 2022-02-01 NOTE — ED Notes (Signed)
Hospital meal provided.  100% consumed, pt tolerated w/o complaints.  Waste discarded appropriately.   

## 2022-02-01 NOTE — ED Notes (Signed)
Cancelled transport until report is called and verified patient accepted

## 2022-02-01 NOTE — ED Notes (Signed)
Called for transport by [facility] to [facility]

## 2022-02-01 NOTE — ED Notes (Signed)
Pt sleeping. 

## 2022-02-01 NOTE — ED Notes (Signed)
Attempt to call report at [phone].

## 2022-02-01 NOTE — ED Notes (Signed)
1 blue bracelet, 1 yellow bracelet, 1 black watch removed from patient and placed into belongings bag.

## 2022-02-01 NOTE — ED Notes (Signed)
Attempt to give report, at [phone]. Left HIPAA complaint return message.

## 2022-02-11 ENCOUNTER — Emergency Department: Payer: Medicare Other

## 2022-02-11 ENCOUNTER — Emergency Department
Admission: EM | Admit: 2022-02-11 | Discharge: 2022-02-11 | Disposition: A | Discharge status: Home / Self Care | Payer: Medicare Other | Attending: Emergency Medicine | Admitting: Emergency Medicine

## 2022-02-11 DIAGNOSIS — F1910 Other psychoactive substance abuse, uncomplicated: Secondary | ICD-10-CM | POA: Insufficient documentation

## 2022-02-11 DIAGNOSIS — Y900 Blood alcohol level of less than 20 mg/100 ml: Secondary | ICD-10-CM | POA: Insufficient documentation

## 2022-02-11 DIAGNOSIS — R569 Unspecified convulsions: Secondary | ICD-10-CM

## 2022-02-11 DIAGNOSIS — S0091XA Abrasion of unspecified part of head, initial encounter: Secondary | ICD-10-CM | POA: Insufficient documentation

## 2022-02-11 DIAGNOSIS — X58XXXA Exposure to other specified factors, initial encounter: Secondary | ICD-10-CM | POA: Insufficient documentation

## 2022-02-11 DIAGNOSIS — F1310 Sedative, hypnotic or anxiolytic abuse, uncomplicated: Secondary | ICD-10-CM | POA: Insufficient documentation

## 2022-02-11 LAB — COMPREHENSIVE METABOLIC PANEL
ALT: 23 U/L (ref 0–44)
AST: 24 U/L (ref 15–41)
Albumin: 3.8 g/dL (ref 3.5–5.0)
Alkaline Phosphatase: 96 U/L (ref 38–126)
Anion gap: 10 (ref 5–15)
BUN: 11 mg/dL (ref 6–20)
CO2: 19 mmol/L — ABNORMAL LOW (ref 22–32)
Calcium: 9.1 mg/dL (ref 8.9–10.3)
Chloride: 111 mmol/L (ref 98–111)
Creatinine, Ser: 1.27 mg/dL — ABNORMAL HIGH (ref 0.61–1.24)
GFR, Estimated: >60 mL/min (ref >60)
Glucose, Bld: 96 mg/dL (ref 70–99)
Potassium: 4.2 mmol/L (ref 3.5–5.1)
Sodium: 140 mmol/L (ref 135–145)
Total Bilirubin: 0.6 mg/dL (ref 0.3–1.2)
Total Protein: 6.6 g/dL (ref 6.5–8.1)

## 2022-02-11 LAB — URINALYSIS, ROUTINE W REFLEX MICROSCOPIC
Bilirubin Urine: NEGATIVE
Glucose, UA: NEGATIVE mg/dL
Hgb urine dipstick: NEGATIVE
Ketones, ur: NEGATIVE mg/dL
Leukocytes,Ua: NEGATIVE
Nitrite: NEGATIVE
Protein, ur: 30 mg/dL — AB
Specific Gravity, Urine: 1.016 (ref 1.005–1.030)
pH: 5 (ref 5.0–8.0)

## 2022-02-11 LAB — SALICYLATE LEVEL: Salicylate Lvl: 23.5 mg/dL (ref 7.0–30.0)

## 2022-02-11 LAB — CBC WITH DIFFERENTIAL/PLATELET
Abs Immature Granulocytes: 0.09 10*3/uL — ABNORMAL HIGH (ref 0.00–0.07)
Basophils Absolute: 0.1 10*3/uL (ref 0.0–0.1)
Basophils Relative: 1 %
Eosinophils Absolute: 0.1 10*3/uL (ref 0.0–0.5)
Eosinophils Relative: 2 %
HCT: 38 % — ABNORMAL LOW (ref 39.0–52.0)
Hemoglobin: 12.7 g/dL — ABNORMAL LOW (ref 13.0–17.0)
Immature Granulocytes: 1 %
Lymphocytes Relative: 20 %
Lymphs Abs: 1.4 10*3/uL (ref 0.7–4.0)
MCH: 31 pg (ref 26.0–34.0)
MCHC: 33.4 g/dL (ref 30.0–36.0)
MCV: 92.7 fL (ref 80.0–100.0)
Monocytes Absolute: 0.5 10*3/uL (ref 0.1–1.0)
Monocytes Relative: 7 %
Neutro Abs: 4.7 10*3/uL (ref 1.7–7.7)
Neutrophils Relative %: 69 %
Platelets: 205 10*3/uL (ref 150–400)
RBC: 4.1 MIL/uL — ABNORMAL LOW (ref 4.22–5.81)
RDW: 14.4 % (ref 11.5–15.5)
WBC: 6.8 10*3/uL (ref 4.0–10.5)
nRBC: 0 % (ref 0.0–0.2)

## 2022-02-11 LAB — URINE DRUG SCREEN, QUALITATIVE (ARMC ONLY)
Amphetamines, Ur Screen: NOT DETECTED
Barbiturates, Ur Screen: NOT DETECTED
Benzodiazepine, Ur Scrn: POSITIVE — AB
Cannabinoid 50 Ng, Ur ~~LOC~~: NOT DETECTED
Cocaine Metabolite,Ur ~~LOC~~: NOT DETECTED
MDMA (Ecstasy)Ur Screen: NOT DETECTED
Methadone Scn, Ur: NOT DETECTED
Opiate, Ur Screen: NOT DETECTED
Phencyclidine (PCP) Ur S: NOT DETECTED
Tricyclic, Ur Screen: POSITIVE — AB

## 2022-02-11 LAB — ACETAMINOPHEN LEVEL: Acetaminophen (Tylenol), Serum: <10 ug/mL — ABNORMAL LOW (ref 10–30)

## 2022-02-11 LAB — ETHANOL: Alcohol, Ethyl (B): <10 mg/dL (ref <10)

## 2022-02-11 MED ORDER — LORAZEPAM 1 MG PO TABS: 1.0000 mg | ORAL_TABLET | Freq: Two times a day (BID) | ORAL | 0 refills | Status: DC

## 2022-02-11 NOTE — ED Notes (Signed)
Received a call from a family member checking on pt. She stated pt was sitting outside in a chair, he "started shaking" and fell backwards, striking his head on the ground. She did not have a timeframe for the seizure like activity.

## 2022-02-11 NOTE — Discharge Instructions (Addendum)
You had a seizure today.  Stop the Wellbutrin.  Go to [facility] as soon as they open.  Let them know that the ER doctor told you not to take the Wellbutrin anymore.  Tell them you need something else.  Please follow-up with neurology.  I have given you the names and contact information for both Dr. Melrose Nakayama and Dr. Manuella Ghazi.  Give them a call and let them know you had your first seizure.  Please do not drive or operate hazardous machinery or climb ladders or take baths or go swimming.  Showers are fine. I have given you a prescription for Ativan 1 mg.  I am only giving 2 pills so you do not get addicted to it.  Take 1 before bed tonight and tomorrow. Please return for any further problems or seizures.

## 2022-02-11 NOTE — ED Provider Notes (Signed)
[facility] Provider Note    Event Date/Time   First MD Initiated Contact with Patient 02/11/22 1618     (approximate)   History   Seizures (Witnessed by family)   HPI  Michael Schmitt is a 51 y.o. male who had 7 or 8 drinks earlier yesterday I understand.  He does not remember the name of what he was drinking.  He says he drinks heavily but not every day.  He has never had a seizure before.  The seizure was not witnessed tonic-clonic type seizure.  Afterwards he became combative and EMS had to give him Versed 10 IM 5 in each arm.  They also had to restrain him briefly.      Physical Exam   Triage Vital Signs: ED Triage Vitals  Enc Vitals Group     BP 02/11/22 1627 101/66     Pulse Rate 02/11/22 1627 99     Resp 02/11/22 1627 20     Temp 02/11/22 1627 97.7 F (36.5 C)     Temp Source 02/11/22 1627 Oral     SpO2 02/11/22 1626 95 %     Weight 02/11/22 1627 198 lb 6.6 oz (90 kg)     Height 02/11/22 1627 6\' 2"  (1.88 m)     Head Circumference --      Peak Flow --      Pain Score --      Pain Loc --      Pain Edu? --      Excl. in [place]? --     Most recent vital signs: Vitals:   02/11/22 1700 02/11/22 1932  BP: 106/77 (!) 149/78  Pulse: 91 89  Resp: 19 18  Temp:  98.6 F (37 C)  SpO2: 96% 98%     General: Patient is sleepy but arousable.  He is oriented follows commands Head there are some superficial abrasions but no obvious major trauma Neck patient denies any pain CV:  Good peripheral perfusion.  Heart regular rate and rhythm no audible murmurs Resp:  Normal effort.  Lungs sound clear Chest is not tender Abd:  No distention.  Abdomen does not appear to be tender Patient will move all extremities and follow commands as I said.   ED Results / Procedures / Treatments   Labs (all labs ordered are listed, but only abnormal results are displayed) Labs Reviewed  CBC WITH DIFFERENTIAL/PLATELET - Abnormal; Notable for the  following components:      Result Value   RBC 4.10 (*)    Hemoglobin 12.7 (*)    HCT 38.0 (*)    Abs Immature Granulocytes 0.09 (*)    All other components within normal limits  COMPREHENSIVE METABOLIC PANEL - Abnormal; Notable for the following components:   CO2 19 (*)    Creatinine, Ser 1.27 (*)    All other components within normal limits  ACETAMINOPHEN LEVEL - Abnormal; Notable for the following components:   Acetaminophen (Tylenol), Serum <10 (*)    All other components within normal limits  URINE DRUG SCREEN, QUALITATIVE ([facility] ONLY) - Abnormal; Notable for the following components:   Tricyclic, Ur Screen POSITIVE (*)    Benzodiazepine, Ur Scrn POSITIVE (*)    All other components within normal limits  URINALYSIS, ROUTINE W REFLEX MICROSCOPIC - Abnormal; Notable for the following components:   Color, Urine YELLOW (*)    APPearance HAZY (*)    Protein, ur 30 (*)    Bacteria, UA RARE (*)  All other components within normal limits  ETHANOL  SALICYLATE LEVEL     EKG  EKG read interpreted by me shows sinus tach rate of 102 rightward axis right bundle branch block no acute ST-T changes is similar to 1 from August.   RADIOLOGY CT of the head and neck done because of possibility of injury with a seizure shows no acute disease radiology read films I reviewed and interpreted them Chest x-ray read by radiology reviewed and interpreted by me as negative  PROCEDURES:  Critical Care performed:   Procedures   MEDICATIONS ORDERED IN ED: Medications - No data to display   IMPRESSION / MDM / [place] / ED COURSE  I reviewed the triage vital signs and the nursing notes.  Differential diagnosis includes, but is not limited to, seizure from alcohol intoxication or withdrawal, seizure from Wellbutrin which is the most likely diagnosis, seizure from brain trauma which is unlikely driven there is no new trauma on the CT seizure from some other problem.  Patient's  presentation is most consistent with acute presentation with potential threat to life or bodily function.  The patient is on the cardiac monitor to evaluate for evidence of arrhythmia and/or significant heart rate changes.  None have been seen ----------------------------------------- 11:43 PM on 02/11/2022 ----------------------------------------- Patient was started on Wellbutrin earlier this week at [facility] he says.  He has never had a seizure before never been on Wellbutrin before and today he had a seizure.  He did drink heavily yesterday.  He does not drink every day and occasionally drinks heavily and is never had a seizure.  I think most likely he had a seizure from combination of the alcohol and the Wellbutrin.  I have told him to stop the Wellbutrin and to cut back on the alcohol considerably have him follow-up with his doctor.  I told him to go to [facility] to let them know he has been taken off Wellbutrin.  FINAL CLINICAL IMPRESSION(S) / ED DIAGNOSES   Final diagnoses:  Seizure ([place])     Rx / DC Orders   ED Discharge Orders          Ordered    LORazepam (ATIVAN) 1 MG tablet  2 times daily        02/11/22 1920             Note:  This document was prepared using Dragon voice recognition software and may include unintentional dictation errors.   Nena Polio, MD 02/11/22 [phone]

## 2022-02-11 NOTE — ED Notes (Signed)
DC instructions given verbally and in writing, understanding voiced, signature obtained.  Pt left ambulatory in stable condition to [facility] to await ride.

## 2022-02-11 NOTE — ED Triage Notes (Signed)
Per EMS, pt had a witnessed seizure, unknown duration. EMS found pt post-ictal. Enroute pt became aggressive, attempting to punch and kick EMS.m EMS gave 5 mg versed IM in both upper extremities and placed pt in 4 point restraints.

## 2022-02-24 ENCOUNTER — Other Ambulatory Visit: Payer: Self-pay

## 2022-02-24 ENCOUNTER — Encounter: Payer: Self-pay | Admitting: Emergency Medicine

## 2022-02-24 ENCOUNTER — Emergency Department
Admission: EM | Admit: 2022-02-24 | Discharge: 2022-02-25 | Disposition: A | Discharge status: Other Acute Inpatient Hospital | Payer: Medicare Other | Attending: Emergency Medicine | Admitting: Emergency Medicine

## 2022-02-24 DIAGNOSIS — F17210 Nicotine dependence, cigarettes, uncomplicated: Secondary | ICD-10-CM | POA: Insufficient documentation

## 2022-02-24 DIAGNOSIS — F1014 Alcohol abuse with alcohol-induced mood disorder: Secondary | ICD-10-CM | POA: Insufficient documentation

## 2022-02-24 DIAGNOSIS — E1122 Type 2 diabetes mellitus with diabetic chronic kidney disease: Secondary | ICD-10-CM | POA: Diagnosis not present

## 2022-02-24 DIAGNOSIS — N189 Chronic kidney disease, unspecified: Secondary | ICD-10-CM | POA: Insufficient documentation

## 2022-02-24 DIAGNOSIS — F17290 Nicotine dependence, other tobacco product, uncomplicated: Secondary | ICD-10-CM | POA: Diagnosis not present

## 2022-02-24 DIAGNOSIS — Y900 Blood alcohol level of less than 20 mg/100 ml: Secondary | ICD-10-CM | POA: Insufficient documentation

## 2022-02-24 DIAGNOSIS — I2510 Atherosclerotic heart disease of native coronary artery without angina pectoris: Secondary | ICD-10-CM | POA: Diagnosis not present

## 2022-02-24 DIAGNOSIS — Z79899 Other long term (current) drug therapy: Secondary | ICD-10-CM | POA: Diagnosis not present

## 2022-02-24 DIAGNOSIS — F10129 Alcohol abuse with intoxication, unspecified: Secondary | ICD-10-CM | POA: Diagnosis not present

## 2022-02-24 DIAGNOSIS — Z9151 Personal history of suicidal behavior: Secondary | ICD-10-CM | POA: Insufficient documentation

## 2022-02-24 DIAGNOSIS — F1010 Alcohol abuse, uncomplicated: Secondary | ICD-10-CM | POA: Diagnosis present

## 2022-02-24 DIAGNOSIS — R45851 Suicidal ideations: Secondary | ICD-10-CM | POA: Insufficient documentation

## 2022-02-24 DIAGNOSIS — Z1152 Encounter for screening for COVID-19: Secondary | ICD-10-CM | POA: Insufficient documentation

## 2022-02-24 DIAGNOSIS — I129 Hypertensive chronic kidney disease with stage 1 through stage 4 chronic kidney disease, or unspecified chronic kidney disease: Secondary | ICD-10-CM | POA: Diagnosis not present

## 2022-02-24 LAB — COMPREHENSIVE METABOLIC PANEL
ALT: 24 U/L (ref 0–44)
AST: 33 U/L (ref 15–41)
Albumin: 4.2 g/dL (ref 3.5–5.0)
Alkaline Phosphatase: 190 U/L — ABNORMAL HIGH (ref 38–126)
Anion gap: 13 (ref 5–15)
BUN: 7 mg/dL (ref 6–20)
CO2: 20 mmol/L — ABNORMAL LOW (ref 22–32)
Calcium: 9.5 mg/dL (ref 8.9–10.3)
Chloride: 105 mmol/L (ref 98–111)
Creatinine, Ser: 0.86 mg/dL (ref 0.61–1.24)
GFR, Estimated: >60 mL/min (ref >60)
Glucose, Bld: 147 mg/dL — ABNORMAL HIGH (ref 70–99)
Potassium: 3.7 mmol/L (ref 3.5–5.1)
Sodium: 138 mmol/L (ref 135–145)
Total Bilirubin: 0.8 mg/dL (ref 0.3–1.2)
Total Protein: 7.5 g/dL (ref 6.5–8.1)

## 2022-02-24 LAB — URINE DRUG SCREEN, QUALITATIVE (ARMC ONLY)
Amphetamines, Ur Screen: NOT DETECTED
Barbiturates, Ur Screen: NOT DETECTED
Benzodiazepine, Ur Scrn: NOT DETECTED
Cannabinoid 50 Ng, Ur ~~LOC~~: NOT DETECTED
Cocaine Metabolite,Ur ~~LOC~~: NOT DETECTED
MDMA (Ecstasy)Ur Screen: NOT DETECTED
Methadone Scn, Ur: NOT DETECTED
Opiate, Ur Screen: NOT DETECTED
Phencyclidine (PCP) Ur S: NOT DETECTED
Tricyclic, Ur Screen: POSITIVE — AB

## 2022-02-24 LAB — ACETAMINOPHEN LEVEL: Acetaminophen (Tylenol), Serum: <10 ug/mL — ABNORMAL LOW (ref 10–30)

## 2022-02-24 LAB — SALICYLATE LEVEL: Salicylate Lvl: <7 mg/dL — ABNORMAL LOW (ref 7.0–30.0)

## 2022-02-24 LAB — CBC
HCT: 44.3 % (ref 39.0–52.0)
Hemoglobin: 14.7 g/dL (ref 13.0–17.0)
MCH: 30.3 pg (ref 26.0–34.0)
MCHC: 33.2 g/dL (ref 30.0–36.0)
MCV: 91.3 fL (ref 80.0–100.0)
Platelets: 286 10*3/uL (ref 150–400)
RBC: 4.85 MIL/uL (ref 4.22–5.81)
RDW: 13.7 % (ref 11.5–15.5)
WBC: 7 10*3/uL (ref 4.0–10.5)
nRBC: 0 % (ref 0.0–0.2)

## 2022-02-24 LAB — ETHANOL: Alcohol, Ethyl (B): <10 mg/dL (ref <10)

## 2022-02-24 MED ORDER — GABAPENTIN 100 MG PO CAPS
100.0000 mg | ORAL_CAPSULE | Freq: Three times a day (TID) | ORAL | Status: DC
  Administered 2022-02-24: 100 mg via ORAL
  Filled 2022-02-24 (×2): qty 1

## 2022-02-24 MED ORDER — LORAZEPAM 1 MG PO TABS: 1.0000 mg | ORAL_TABLET | Freq: Four times a day (QID) | ORAL | Status: DC | PRN

## 2022-02-24 MED ORDER — HYDROXYZINE HCL 25 MG PO TABS
25.0000 mg | ORAL_TABLET | Freq: Once | ORAL | Status: AC
  Administered 2022-02-24: 25 mg via ORAL
  Filled 2022-02-24: qty 1

## 2022-02-24 NOTE — BH Assessment (Signed)
Patient referred out. Pending bed with [facility]. Waiting on COVID results.

## 2022-02-24 NOTE — ED Notes (Signed)
Pt comes via EMs from [facility] lodge with c/o alcohol intoxication and SI thoughts. VSS

## 2022-02-24 NOTE — ED Notes (Signed)
Pt Belongings:  [employer] manager cell phone [employer]  $40ish in cash Black watch

## 2022-02-24 NOTE — ED Provider Notes (Addendum)
[facility] Provider Note    Event Date/Time   First MD Initiated Contact with Patient 02/24/22 1206     (approximate)   History   Suicidal   HPI  Michael Schmitt is a 51 y.o. male   Past medical history of bipolar, anxiety, CAD, CKD, diabetes, hypertension and hyperlipidemia who presents to the emergency department with suicidal ideation and alcohol intoxication.   That he has been increasingly depressed has passive suicidal ideation.  He has no plan.  He states that he drank alcohol just before coming to the emergency department but denies drug use or other ingestions.  Denies self-harm today.  He wants a medication for anxiety and is requesting Ativan specifically.  History was obtained via the patient. Also reviewed external medical notes including an ED note dated 01/31/2022 when he came to the emergency department with alcohol use and suicidal ideation and he was observed and seen by behavioral health     Physical Exam   Triage Vital Signs: ED Triage Vitals  Enc Vitals Group     BP 02/24/22 1137 (!) 158/113     Pulse Rate 02/24/22 1137 (!) 109     Resp 02/24/22 1137 20     Temp 02/24/22 1219 98 F (36.7 C)     Temp Source 02/24/22 1219 Oral     SpO2 02/24/22 1137 95 %     Weight 02/24/22 1154 198 lb 6.6 oz (90 kg)     Height 02/24/22 1154 6\' 2"  (1.88 m)     Head Circumference --      Peak Flow --      Pain Score 02/24/22 1154 3     Pain Loc --      Pain Edu? --      Excl. in [place]? --     Most recent vital signs: Vitals:   02/24/22 1137 02/24/22 1219  BP: (!) 158/113   Pulse: (!) 109   Resp: 20   Temp:  98 F (36.7 C)  SpO2: 95%     General: Awake, no distress.  CV:  Good peripheral perfusion.  Resp:  Normal effort.  Abd:  No distention.  Other:  Awake, alert, oriented and cooperative, no signs of trauma, abdomen soft and nontender, lungs clear.   ED Results / Procedures / Treatments   Labs (all labs ordered are  listed, but only abnormal results are displayed) Labs Reviewed  COMPREHENSIVE METABOLIC PANEL - Abnormal; Notable for the following components:      Result Value   CO2 20 (*)    Glucose, Bld 147 (*)    Alkaline Phosphatase 190 (*)    All other components within normal limits  SALICYLATE LEVEL - Abnormal; Notable for the following components:   Salicylate Lvl <9.8 (*)    All other components within normal limits  ACETAMINOPHEN LEVEL - Abnormal; Notable for the following components:   Acetaminophen (Tylenol), Serum <10 (*)    All other components within normal limits  URINE DRUG SCREEN, QUALITATIVE ([facility] ONLY) - Abnormal; Notable for the following components:   Tricyclic, Ur Screen POSITIVE (*)    All other components within normal limits  ETHANOL  CBC     I reviewed labs and they are notable for he has a blood glucose level of 147.  Positive for tricyclics, consistent with prior utoxs   PROCEDURES:  Critical Care performed: No  Procedures   MEDICATIONS ORDERED IN ED: Medications  gabapentin (NEURONTIN) capsule 100 mg (  has no administration in time range)  hydrOXYzine (ATARAX) tablet 25 mg (25 mg Oral Given 02/24/22 1225)     IMPRESSION / MDM / ASSESSMENT AND PLAN / ED COURSE  I reviewed the triage vital signs and the nursing notes.                              Differential diagnosis includes, but is not limited to, alcohol intoxication, suicidal ideation, decompensated psychiatric illness, toxidrome  MDM: Basic labs and toxicologic labs, patient with passive suicidal ideation and alcohol use.  He otherwise appears well.  He is here voluntarily asking for psychiatric help.   Patient's presentation is most consistent with acute presentation with potential threat to life or bodily function.       FINAL CLINICAL IMPRESSION(S) / ED DIAGNOSES   Final diagnoses:  Suicidal ideation  Alcohol abuse     Rx / DC Orders   ED Discharge Orders     None         Note:  This document was prepared using Dragon voice recognition software and may include unintentional dictation errors.    Lucillie Garfinkel, MD 02/24/22 2395    Lucillie Garfinkel, MD 02/24/22 [phone]

## 2022-02-24 NOTE — ED Notes (Signed)
vol/psych consult ordered/pending.. 

## 2022-02-24 NOTE — ED Notes (Signed)
Declined lunch.

## 2022-02-24 NOTE — ED Notes (Signed)
Tired to get blood x2 and unable to obtain a sample.

## 2022-02-24 NOTE — Consult Note (Signed)
[facility] Consult   Reason for Consult:  alcohol abuse with suicidal ideations Referring Physician:  EDP Patient Identification: Michael Schmitt MRN:  [id number] Principal Diagnosis: Alcohol abuse with alcohol-induced mood disorder ([place]) Diagnosis:  Principal Problem:   Alcohol abuse with alcohol-induced mood disorder ([place])   Total Time spent with patient: 45 minutes  Subjective:   Michael Schmitt is a 51 y.o. male patient admitted with alcohol abuse with suicidal ideations."If you discharge me, I don't know what I'm going to do"   HPI:  Michael Schmitt is a 51 y.o. male admitted with alcohol abuse. Patient reports being discharged from [facility] 3 weeks ago because he felt ready to leave and no longer wanted to be there. States all medication was discontinued at [facility] after he had a seizure and so he was discharged . Since discharge he has been self-medicating with a case of beer per night and states he is suicidal with a plan to "slice my wrists." Patient reports "a bunch" of suicide attempts in the past including an attempt to hang himself and "if you discharge me, I don't know what I'm going to." Patient continues to report he needs help, the only medication that works for him is Ativan. Is requesting to return to [facility] or go to [facility]. Denies homicidal and audio and/or visual hallucinations.  He is well-known to this ED for similar presentations, often housing is the bigger issue for him and lack of social support.  Past Psychiatric History: polysubstance use d/o, depression, anxiety  Risk to Self:  none Risk to Others:  none Prior Inpatient Therapy:  multiple Prior Outpatient Therapy: yes  Past Medical History:  Past Medical History:  Diagnosis Date   Anxiety    Anxiety    Arthritis    cerv. stenosis, spondylosis, HNP- lower back , has been followed in pain clinic, has  had injection s in cerv. 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 Cervical three-four.   CARDIAC CATHETERIZATION N/A 01/01/2015   Procedure: Left Heart Cath and Coronary Angiography;  Surgeon: [name], MD;  Location: [facility];  Service: Cardiovascular;  Laterality: N/A;   CARDIAC CATHETERIZATION N/A 04/05/2016   Procedure: Left Heart Cath and Coronary Angiography;  Surgeon: Belva Crome, MD;  Location: [place] [facility];  Service: Cardiovascular;  Laterality: N/A;   CARDIAC CATHETERIZATION N/A 04/05/2016  Procedure: Coronary Stent Intervention;  Surgeon: Belva Crome, MD;  Location: [place] [place];  Service: Cardiovascular;  Laterality: N/A;   CARDIAC CATHETERIZATION N/A 04/05/2016   Procedure: Intravascular Ultrasound/IVUS;  Surgeon: Belva Crome, MD;  Location: [place] [facility];  Service: Cardiovascular;  Laterality: N/A;   NASAL SINUS SURGERY     2005   Family History:  Family History  Problem Relation Age of Onset   Prostate cancer Father     Hypertension Mother    Kidney Stones Mother    Anxiety disorder Mother    Depression Mother    COPD Sister    Hypertension Sister    Diabetes Sister    Depression Sister    Anxiety disorder Sister    Seizures Sister    ADD / ADHD Son    ADD / ADHD Daughter    Family Psychiatric  History: see above Social History:  Social History   Substance and Sexual Activity  Alcohol Use No   Alcohol/week: 0.0 standard drinks of alcohol   Comment: last drinked in 3 months.      Social History   Substance and Sexual Activity  Drug Use No    Social History   Socioeconomic History   Marital status: Single    Spouse name: Not on file   Number of children: 3   Years of education: 12   Highest education level: Not on file  Occupational History    Comment: unemployed  Tobacco Use   Smoking status: Every Day    Packs/day: 2.00    Years: 17.00    Total pack years: 34.00    Types: Cigarettes, Cigars   Smokeless tobacco: Former   Tobacco comments:    occassional snuff   Vaping Use   Vaping Use: Never used  Substance and Sexual Activity   Alcohol use: No    Alcohol/week: 0.0 standard drinks of alcohol    Comment: last drinked in 3 months.    Drug use: No   Sexual activity: Not Currently  Other Topics Concern   Not on file  Social History Narrative   Patient is right handed.   Patient drinks 2 sodas daily.   Social Determinants of Health   Financial Resource Strain: Not on file  Food Insecurity: Not on file  Transportation Needs: Not on file  Physical Activity: Not on file  Stress: Not on file  Social Connections: Not on file   Additional Social History:    Allergies:   Allergies  Allergen Reactions   Prednisone Other (See Comments)    Hypertension and makes him feel "spacey"   Amoxicillin Other (See Comments)    Other Reaction: ELEVATED BP   Hydrocodone Other (See Comments)    Causes headaches and makes the patient irritable   Varenicline Other (See Comments)     Suicidal thoughts   Other Anxiety    Other reaction(s): Other (See Comments) Patient stated that this keeps him awake; "has an opposite effect on me"   Trazodone And Nefazodone Anxiety and Other (See Comments)    Patient stated that this keeps him awake; "has an opposite effect on me"    Labs:  Results for orders placed or performed during the hospital encounter of 02/24/22 (from the past 48 hour(s))  Comprehensive metabolic panel     Status: Abnormal   Collection Time: 02/24/22 12:00 PM  Result Value Ref Range   Sodium 138 135 - 145 mmol/L   Potassium 3.7 3.5 - 5.1  mmol/L   Chloride 105 98 - 111 mmol/L   CO2 20 (L) 22 - 32 mmol/L   Glucose, Bld 147 (H) 70 - 99 mg/dL    Comment: Glucose reference range applies only to samples taken after fasting for at least 8 hours.   BUN 7 6 - 20 mg/dL   Creatinine, Ser 0.86 0.61 - 1.24 mg/dL   Calcium 9.5 8.9 - 10.3 mg/dL   Total Protein 7.5 6.5 - 8.1 g/dL   Albumin 4.2 3.5 - 5.0 g/dL   AST 33 15 - 41 U/L   ALT 24 0 - 44 U/L   Alkaline Phosphatase 190 (H) 38 - 126 U/L   Total Bilirubin 0.8 0.3 - 1.2 mg/dL   GFR, Estimated >60 >60 mL/min    Comment: (NOTE) Calculated using the CKD-EPI Creatinine Equation (2021)    Anion gap 13 5 - 15    Comment: Performed at [facility], [address] [place], [place] [address]  Ethanol     Status: None   Collection Time: 02/24/22 12:00 PM  Result Value Ref Range   Alcohol, Ethyl (B) <10 <10 mg/dL    Comment: (NOTE) Lowest detectable limit for serum alcohol is 10 mg/dL.  For medical purposes only. Performed at [facility], [address] [place], [place] [address]   Salicylate level     Status: Abnormal   Collection Time: 02/24/22 12:00 PM  Result Value Ref Range   Salicylate Lvl <6.6 (L) 7.0 - 30.0 mg/dL    Comment: Performed at [facility], [address] [place], [place] [address]  Acetaminophen level     Status: Abnormal   Collection Time: 02/24/22 12:00 PM   Result Value Ref Range   Acetaminophen (Tylenol), Serum <10 (L) 10 - 30 ug/mL    Comment: (NOTE) Therapeutic concentrations vary significantly. A range of 10-30 ug/mL  may be an effective concentration for many patients. However, some  are best treated at concentrations outside of this range. Acetaminophen concentrations >150 ug/mL at 4 hours after ingestion  and >50 ug/mL at 12 hours after ingestion are often associated with  toxic reactions.  Performed at [facility], [address] [place], [place] [address]   cbc     Status: None   Collection Time: 02/24/22 12:00 PM  Result Value Ref Range   WBC 7.0 4.0 - 10.5 K/uL   RBC 4.85 4.22 - 5.81 MIL/uL   Hemoglobin 14.7 13.0 - 17.0 g/dL   HCT 44.3 39.0 - 52.0 %   MCV 91.3 80.0 - 100.0 fL   MCH 30.3 26.0 - 34.0 pg   MCHC 33.2 30.0 - 36.0 g/dL   RDW 13.7 11.5 - 15.5 %   Platelets 286 150 - 400 K/uL   nRBC 0.0 0.0 - 0.2 %    Comment: Performed at [facility], [address] [place],  [address]  Urine Drug Screen, Qualitative     Status: Abnormal   Collection Time: 02/24/22 12:10 PM  Result Value Ref Range   Tricyclic, Ur Screen POSITIVE (A) NONE DETECTED   Amphetamines, Ur Screen NONE DETECTED NONE DETECTED   MDMA (Ecstasy)Ur Screen NONE DETECTED NONE DETECTED   Cocaine Metabolite,Ur [place] NONE DETECTED NONE DETECTED   Opiate, Ur Screen NONE DETECTED NONE DETECTED   Phencyclidine (PCP) Ur S NONE DETECTED NONE DETECTED   Cannabinoid 50 Ng, Ur [place] NONE DETECTED NONE DETECTED   Barbiturates, Ur Screen NONE DETECTED NONE DETECTED   Benzodiazepine, Ur  Scrn NONE DETECTED NONE DETECTED   Methadone Scn, Ur NONE DETECTED NONE DETECTED    Comment: (NOTE) Tricyclics + metabolites, urine    Cutoff 1000 ng/mL Amphetamines + metabolites, urine  Cutoff 1000 ng/mL MDMA (Ecstasy), urine              Cutoff 500 ng/mL Cocaine Metabolite, urine          Cutoff 300 ng/mL Opiate + metabolites, urine        Cutoff 300  ng/mL Phencyclidine (PCP), urine         Cutoff 25 ng/mL Cannabinoid, urine                 Cutoff 50 ng/mL Barbiturates + metabolites, urine  Cutoff 200 ng/mL Benzodiazepine, urine              Cutoff 200 ng/mL Methadone, urine                   Cutoff 300 ng/mL  The urine drug screen provides only a preliminary, unconfirmed analytical test result and should not be used for non-medical purposes. Clinical consideration and professional judgment should be applied to any positive drug screen result due to possible interfering substances. A more specific alternate chemical method must be used in order to obtain a confirmed analytical result. Gas chromatography / mass spectrometry (GC/MS) is the preferred confirm atory method. Performed at [facility], [address] [place], [place] [address]     Current Facility-Administered Medications  Medication Dose Route Frequency Provider Last Rate Last Admin   gabapentin (NEURONTIN) capsule 100 mg  100 mg Oral TID Patrecia Pour, NP       LORazepam (ATIVAN) tablet 1 mg  1 mg Oral Q6H PRN Patrecia Pour, NP       Current Outpatient Medications  Medication Sig Dispense Refill   acamprosate (CAMPRAL) 333 MG tablet Take 666 mg by mouth 3 (three) times daily with meals.     amLODipine (NORVASC) 5 MG tablet Take 5 mg by mouth daily.     atorvastatin (LIPITOR) 20 MG tablet Take 20 mg by mouth daily.     carvedilol (COREG) 6.25 MG tablet Take 6.25 mg by mouth 2 (two) times daily with a meal.     chlorproMAZINE (THORAZINE) 200 MG tablet Take 1 tablet by mouth in the morning, at noon, and at bedtime.     clopidogrel (PLAVIX) 75 MG tablet Take 75 mg by mouth daily.     cyclobenzaprine (FLEXERIL) 10 MG tablet Take 10 mg by mouth 3 (three) times daily as needed for muscle spasms. (Patient not taking: Reported on 02/01/2022)     divalproex (DEPAKOTE) 500 MG DR tablet Take 1,500 mg by mouth at bedtime.     doxepin (SINEQUAN) 25 MG capsule Take  25 mg by mouth.     DULoxetine (CYMBALTA) 60 MG capsule Take 60 mg by mouth at bedtime.     escitalopram (LEXAPRO) 10 MG tablet Take 1 tablet (10 mg total) by mouth daily. 30 tablet 2   gabapentin (NEURONTIN) 300 MG capsule Take 300 mg by mouth 3 (three) times daily.     glipiZIDE (GLUCOTROL XL) 10 MG 24 hr tablet Take 10 mg by mouth daily with breakfast. (Patient not taking: Reported on 02/01/2022)     haloperidol (HALDOL) 5 MG tablet Take 5 mg by mouth 2 (two) times daily.     LORazepam (ATIVAN) 1 MG tablet Take 1 tablet (1 mg total) by mouth  2 (two) times daily. 2 tablet 0   metFORMIN (GLUCOPHAGE) 500 MG tablet Take 1,000 mg by mouth 2 (two) times daily.     methocarbamol (ROBAXIN) 500 MG tablet Take 500 mg by mouth every 6 (six) hours as needed for muscle spasms.     methylPREDNISolone (MEDROL DOSEPAK) 4 MG TBPK tablet Take by mouth as directed. (Patient not taking: Reported on 02/01/2022)     mirtazapine (REMERON) 15 MG tablet Take 15 mg by mouth at bedtime. (Patient not taking: Reported on 02/01/2022)     pantoprazole (PROTONIX) 40 MG tablet Take 1 tablet by mouth daily.     propranolol (INDERAL) 10 MG tablet Take 1 tablet (10 mg total) by mouth 2 (two) times daily. (Patient not taking: Reported on 02/01/2022) 60 tablet 2   QUEtiapine (SEROQUEL) 400 MG tablet Take 2 tablets (800 mg total) by mouth at bedtime. 30 tablet 2    Musculoskeletal: Strength & Muscle Tone: within normal limits Gait & Station: normal Patient leans: N/A  Psychiatric Specialty Exam: Physical Exam Vitals and nursing note reviewed.  Constitutional:      Appearance: Normal appearance.  HENT:     Head: Normocephalic.     Nose: Nose normal.  Pulmonary:     Effort: Pulmonary effort is normal.  Musculoskeletal:        General: Normal range of motion.     Cervical back: Normal range of motion.  Neurological:     General: No focal deficit present.     Mental Status: He is alert and oriented to person, place, and  time.  Psychiatric:        Attention and Perception: Attention and perception normal.        Mood and Affect: Mood is anxious and depressed.        Speech: Speech normal.        Behavior: Behavior normal. Behavior is cooperative.        Cognition and Memory: Cognition and memory normal.        Judgment: Judgment normal.     Review of Systems  Psychiatric/Behavioral:  Positive for substance abuse. The patient is nervous/anxious.   All other systems reviewed and are negative.   Blood pressure (!) 158/113, pulse (!) 109, temperature 98 F (36.7 C), temperature source Oral, resp. rate 20, height 6\' 2"  (1.88 m), weight 90 kg, SpO2 95 %.Body mass index is 25.47 kg/m.  General Appearance: Casual  Eye Contact:  Good  Speech:  Normal Rate  Volume:  Normal  Mood:  Anxious and Depressed  Affect:  Blunt  Thought Process:  Coherent  Orientation:  Full (Time, Place, and Person)  Thought Content:  WDL  Suicidal Thoughts:  Yes.  with intent/plan  Homicidal Thoughts:  No  Memory:  Immediate;   Good Recent;   Good Remote;   Good  Judgement:  Fair  Insight:  Fair  Psychomotor Activity:  Normal  Concentration:  Concentration: Good and Attention Span: Good  Recall:  Good  Fund of Knowledge:  Fair  Language:  Good  Akathisia:  No  Handed:  Right  AIMS (if indicated):     Assets:  Communication Skills Desire for Improvement Social Support  ADL's:  Intact  Cognition:  WNL  Sleep:        Physical Exam: Physical Exam Vitals and nursing note reviewed.  Constitutional:      Appearance: Normal appearance.  HENT:     Head: Normocephalic.     Nose: Nose normal.  Pulmonary:     Effort: Pulmonary effort is normal.  Musculoskeletal:        General: Normal range of motion.     Cervical back: Normal range of motion.  Neurological:     General: No focal deficit present.     Mental Status: He is alert and oriented to person, place, and time.  Psychiatric:        Attention and Perception:  Attention and perception normal.        Mood and Affect: Mood is anxious and depressed.        Speech: Speech normal.        Behavior: Behavior normal. Behavior is cooperative.        Cognition and Memory: Cognition and memory normal.        Judgment: Judgment normal.    Review of Systems  Psychiatric/Behavioral:  Positive for substance abuse. The patient is nervous/anxious.   All other systems reviewed and are negative.  Blood pressure (!) 158/113, pulse (!) 109, temperature 98 F (36.7 C), temperature source Oral, resp. rate 20, height 6\' 2"  (1.88 m), weight 90 kg, SpO2 95 %. Body mass index is 25.47 kg/m.  Treatment Plan Summary: Alcohol abuse with alcohol induced mood disorder: CIWA implemented Ativan PRN ordered for withdrawal symptoms  Anxiety: Gabapentin 100 mg TID   Disposition: Recommend psychiatric Inpatient admission when medically cleared. Re-evaluate in the morning  Waylan Boga, NP 02/24/2022 1:45 PM

## 2022-02-24 NOTE — ED Notes (Signed)
Vol/consult done/recommends psychiatric inpatient admission when medically cleared/re-evaluate in the morning.

## 2022-02-24 NOTE — ED Triage Notes (Signed)
Patient reports suicidal ideations and etoh use. Denies any drug use. Pt reports being at [facility] 3 weeks ago and started on meds that caused seizure medicine so was then taken off meds. Repots he has not been taking his other prescribed meds due to wanting to give up and die.

## 2022-02-25 DIAGNOSIS — F1014 Alcohol abuse with alcohol-induced mood disorder: Secondary | ICD-10-CM | POA: Diagnosis not present

## 2022-02-25 LAB — SARS CORONAVIRUS 2 BY RT PCR: SARS Coronavirus 2 by RT PCR: NEGATIVE

## 2022-02-25 MED ORDER — CLOPIDOGREL BISULFATE 75 MG PO TABS: 75.0000 mg | ORAL_TABLET | Freq: Every day | ORAL | Status: DC

## 2022-02-25 MED ORDER — CHLORPROMAZINE HCL 100 MG PO TABS
200.0000 mg | ORAL_TABLET | Freq: Three times a day (TID) | ORAL | Status: DC
  Filled 2022-02-25: qty 2

## 2022-02-25 MED ORDER — GLIPIZIDE ER 10 MG PO TB24
10.0000 mg | ORAL_TABLET | Freq: Every day | ORAL | Status: DC
  Filled 2022-02-25: qty 1

## 2022-02-25 MED ORDER — METFORMIN HCL 500 MG PO TABS: 1000.0000 mg | ORAL_TABLET | Freq: Two times a day (BID) | ORAL | Status: DC

## 2022-02-25 MED ORDER — PROPRANOLOL HCL 20 MG PO TABS: 10.0000 mg | ORAL_TABLET | Freq: Two times a day (BID) | ORAL | Status: DC

## 2022-02-25 MED ORDER — CYCLOBENZAPRINE HCL 10 MG PO TABS: 10.0000 mg | ORAL_TABLET | Freq: Three times a day (TID) | ORAL | Status: DC | PRN

## 2022-02-25 MED ORDER — METHOCARBAMOL 500 MG PO TABS: 500.0000 mg | ORAL_TABLET | Freq: Four times a day (QID) | ORAL | Status: DC | PRN

## 2022-02-25 MED ORDER — QUETIAPINE FUMARATE 300 MG PO TABS
800.0000 mg | ORAL_TABLET | Freq: Once | ORAL | Status: AC
  Administered 2022-02-25: 800 mg via ORAL
  Filled 2022-02-25: qty 1

## 2022-02-25 MED ORDER — DIVALPROEX SODIUM 500 MG PO DR TAB: 1500.0000 mg | DELAYED_RELEASE_TABLET | Freq: Every day | ORAL | Status: DC

## 2022-02-25 MED ORDER — MIRTAZAPINE 15 MG PO TABS: 15.0000 mg | ORAL_TABLET | Freq: Every day | ORAL | Status: DC

## 2022-02-25 MED ORDER — ATORVASTATIN CALCIUM 20 MG PO TABS: 20.0000 mg | ORAL_TABLET | Freq: Every day | ORAL | Status: DC

## 2022-02-25 MED ORDER — QUETIAPINE FUMARATE 300 MG PO TABS: 800.0000 mg | ORAL_TABLET | Freq: Every day | ORAL | Status: DC

## 2022-02-25 MED ORDER — CARVEDILOL 6.25 MG PO TABS: 6.2500 mg | ORAL_TABLET | Freq: Two times a day (BID) | ORAL | Status: DC

## 2022-02-25 MED ORDER — LORAZEPAM 1 MG PO TABS: 1.0000 mg | ORAL_TABLET | Freq: Two times a day (BID) | ORAL | Status: DC

## 2022-02-25 MED ORDER — AMLODIPINE BESYLATE 5 MG PO TABS: 5.0000 mg | ORAL_TABLET | Freq: Every day | ORAL | Status: DC

## 2022-02-25 MED ORDER — HALOPERIDOL 5 MG PO TABS: 5.0000 mg | ORAL_TABLET | Freq: Two times a day (BID) | ORAL | Status: DC

## 2022-02-25 MED ORDER — ACAMPROSATE CALCIUM 333 MG PO TBEC
666.0000 mg | DELAYED_RELEASE_TABLET | Freq: Three times a day (TID) | ORAL | Status: DC
  Filled 2022-02-25 (×2): qty 2

## 2022-02-25 MED ORDER — DULOXETINE HCL 60 MG PO CPEP: 60.0000 mg | ORAL_CAPSULE | Freq: Every day | ORAL | Status: DC

## 2022-02-25 MED ORDER — GABAPENTIN 300 MG PO CAPS: 300.0000 mg | ORAL_CAPSULE | Freq: Three times a day (TID) | ORAL | Status: DC

## 2022-02-25 MED ORDER — PANTOPRAZOLE SODIUM 40 MG PO TBEC: 40.0000 mg | DELAYED_RELEASE_TABLET | Freq: Every day | ORAL | Status: DC

## 2022-02-25 MED ORDER — DOXEPIN HCL 25 MG PO CAPS: 25.0000 mg | ORAL_CAPSULE | Freq: Every day | ORAL | Status: DC

## 2022-02-25 NOTE — BH Assessment (Signed)
Patient has been accepted to [facility].  Patient assigned to room [facility] Accepting physician is Dr. Kathlene Cote.  Call report to [phone].  Representative was [place].   ER Staff is aware of it:  Ronnie, ER Secretary  Dr. Quentin Cornwall, ER MD  Rush Landmark, Patient's Nurse

## 2022-02-25 NOTE — ED Notes (Signed)
Lab was called d/t pending COVID swab that is currently not in process. Per lab technician, swab is now in process.

## 2022-02-25 NOTE — ED Notes (Signed)
Pt began to yell. RN, NT and security to bedside. Pt states he had been asking for his Seroquel all night. Returned to bedside informed him a doctor has been requested to review his medications and that it will take time to get these ordered. Since then pt has yelled out 3 times. Screaming "help."

## 2022-02-25 NOTE — ED Notes (Addendum)
Attempted too call report - asked to call back in 15 minutes. Nurse made aware that transport was here and pt was fixing to be on the way. Nurse stated that that was ok, report would not change.

## 2022-02-25 NOTE — ED Provider Notes (Signed)
Emergency Medicine Observation Re-evaluation Note  Michael Schmitt is a 51 y.o. male, seen on rounds today.  Pt initially presented to the ED for complaints of Suicidal Currently, the patient is resting.  Physical Exam  BP 123/75   Pulse 85   Temp 98 F (36.7 C) (Oral)   Resp 16   Ht 1.88 m (6\' 2" )   Wt 90 kg   SpO2 98%   BMI 25.47 kg/m  Physical Exam Gen:  No acute distress Resp:  Breathing easily and comfortably, no accessory muscle usage Neuro:  Moving all four extremities, no gross focal neuro deficits Psych:  Resting currently, calm when awake  ED Course / MDM  EKG:   I have reviewed the labs performed to date as well as medications administered while in observation.  Recent changes in the last 24 hours include initial EDP evaluation and psychiatry evaluation..  Plan  Current plan is for psych admission.    Hinda Kehr, MD 02/25/22 [phone]

## 2022-02-25 NOTE — ED Notes (Signed)
Attempted to call report to [facility]  [phone]) no answer

## 2022-04-18 ENCOUNTER — Other Ambulatory Visit: Payer: Self-pay

## 2022-04-18 ENCOUNTER — Encounter: Payer: Self-pay | Admitting: Emergency Medicine

## 2022-04-18 ENCOUNTER — Emergency Department
Admission: EM | Admit: 2022-04-18 | Discharge: 2022-04-18 | Disposition: A | Discharge status: Home / Self Care | Payer: 59 | Attending: Emergency Medicine | Admitting: Emergency Medicine

## 2022-04-18 ENCOUNTER — Emergency Department: Payer: 59

## 2022-04-18 DIAGNOSIS — E119 Type 2 diabetes mellitus without complications: Secondary | ICD-10-CM | POA: Insufficient documentation

## 2022-04-18 DIAGNOSIS — I10 Essential (primary) hypertension: Secondary | ICD-10-CM | POA: Insufficient documentation

## 2022-04-18 DIAGNOSIS — M79632 Pain in left forearm: Secondary | ICD-10-CM | POA: Diagnosis present

## 2022-04-18 DIAGNOSIS — L03114 Cellulitis of left upper limb: Secondary | ICD-10-CM | POA: Insufficient documentation

## 2022-04-18 DIAGNOSIS — R569 Unspecified convulsions: Secondary | ICD-10-CM | POA: Insufficient documentation

## 2022-04-18 DIAGNOSIS — W19XXXA Unspecified fall, initial encounter: Secondary | ICD-10-CM | POA: Insufficient documentation

## 2022-04-18 LAB — COMPREHENSIVE METABOLIC PANEL
ALT: 13 U/L (ref 0–44)
AST: 16 U/L (ref 15–41)
Albumin: 3.9 g/dL (ref 3.5–5.0)
Alkaline Phosphatase: 91 U/L (ref 38–126)
Anion gap: 9 (ref 5–15)
BUN: 11 mg/dL (ref 6–20)
CO2: 20 mmol/L — ABNORMAL LOW (ref 22–32)
Calcium: 8.7 mg/dL — ABNORMAL LOW (ref 8.9–10.3)
Chloride: 106 mmol/L (ref 98–111)
Creatinine, Ser: 1.04 mg/dL (ref 0.61–1.24)
GFR, Estimated: >60 mL/min (ref >60)
Glucose, Bld: 217 mg/dL — ABNORMAL HIGH (ref 70–99)
Potassium: 4 mmol/L (ref 3.5–5.1)
Sodium: 135 mmol/L (ref 135–145)
Total Bilirubin: 0.3 mg/dL (ref 0.3–1.2)
Total Protein: 7.9 g/dL (ref 6.5–8.1)

## 2022-04-18 LAB — CBC WITH DIFFERENTIAL/PLATELET
Abs Immature Granulocytes: 0.06 10*3/uL (ref 0.00–0.07)
Basophils Absolute: 0 10*3/uL (ref 0.0–0.1)
Basophils Relative: 0 %
Eosinophils Absolute: 0.1 10*3/uL (ref 0.0–0.5)
Eosinophils Relative: 1 %
HCT: 40.2 % (ref 39.0–52.0)
Hemoglobin: 12.3 g/dL — ABNORMAL LOW (ref 13.0–17.0)
Immature Granulocytes: 1 %
Lymphocytes Relative: 8 %
Lymphs Abs: 0.9 10*3/uL (ref 0.7–4.0)
MCH: 29.1 pg (ref 26.0–34.0)
MCHC: 30.6 g/dL (ref 30.0–36.0)
MCV: 95.3 fL (ref 80.0–100.0)
Monocytes Absolute: 0.6 10*3/uL (ref 0.1–1.0)
Monocytes Relative: 5 %
Neutro Abs: 9.5 10*3/uL — ABNORMAL HIGH (ref 1.7–7.7)
Neutrophils Relative %: 85 %
Platelets: 291 10*3/uL (ref 150–400)
RBC: 4.22 MIL/uL (ref 4.22–5.81)
RDW: 13.8 % (ref 11.5–15.5)
WBC: 11.1 10*3/uL — ABNORMAL HIGH (ref 4.0–10.5)
nRBC: 0 % (ref 0.0–0.2)

## 2022-04-18 LAB — LACTIC ACID, PLASMA: Lactic Acid, Venous: 1.3 mmol/L (ref 0.5–1.9)

## 2022-04-18 LAB — URINALYSIS, ROUTINE W REFLEX MICROSCOPIC
Bacteria, UA: NONE SEEN
Bilirubin Urine: NEGATIVE
Glucose, UA: >=500 mg/dL — AB
Hgb urine dipstick: NEGATIVE
Ketones, ur: NEGATIVE mg/dL
Leukocytes,Ua: NEGATIVE
Nitrite: NEGATIVE
Protein, ur: NEGATIVE mg/dL
Specific Gravity, Urine: 1.01 (ref 1.005–1.030)
pH: 6 (ref 5.0–8.0)

## 2022-04-18 MED ORDER — SODIUM CHLORIDE 0.9 % IV SOLN
2.0000 g | Freq: Once | INTRAVENOUS | Status: AC
  Administered 2022-04-18: 2 g via INTRAVENOUS
  Filled 2022-04-18: qty 20

## 2022-04-18 MED ORDER — CEPHALEXIN 500 MG PO CAPS: 500.0000 mg | ORAL_CAPSULE | Freq: Three times a day (TID) | ORAL | 0 refills | Status: AC

## 2022-04-18 MED ORDER — KETOROLAC TROMETHAMINE 30 MG/ML IJ SOLN
30.0000 mg | Freq: Once | INTRAMUSCULAR | Status: AC
  Administered 2022-04-18: 30 mg via INTRAVENOUS
  Filled 2022-04-18: qty 1

## 2022-04-18 MED ORDER — OXYCODONE-ACETAMINOPHEN 7.5-325 MG PO TABS: 1.0000 | ORAL_TABLET | Freq: Three times a day (TID) | ORAL | 0 refills | Status: DC | PRN

## 2022-04-18 MED ORDER — SULFAMETHOXAZOLE-TRIMETHOPRIM 800-160 MG PO TABS: 1.0000 | ORAL_TABLET | Freq: Two times a day (BID) | ORAL | 0 refills | Status: AC

## 2022-04-18 NOTE — ED Provider Notes (Signed)
[facility] Provider Note    Event Date/Time   First MD Initiated Contact with Patient 04/18/22 1243     (approximate)   History   Fall   HPI  Michael Schmitt is a 52 y.o. male with significant past medical history of alcohol use, benzodiazepine abuse, type 2 diabetes, opiate use, hypertension, anxiety, and as listed in EMR presents to the emergency department for treatment and evaluation of pain to the left forearm and elbow.  Patient states that he had seizure-like activity about 6 days ago that caused him to fall out of his car and onto the concrete landing on his left elbow.  He states that during this episode he did not completely lose consciousness and was not incontinent of urine.  He was able to prevent himself from striking his head on the concrete.  Since then pain in the left elbow has continued to worsen and over the past couple of days redness and swelling has significantly increased.  No alleviating measures attempted prior to arrival.  Patient has had 1 episode of seizure-like activity a couple of months ago but was not prescribed any type of seizure medication.      Physical Exam   Triage Vital Signs: ED Triage Vitals  Enc Vitals Group     BP 04/18/22 1031 122/87     Pulse Rate 04/18/22 1031 (!) 101     Resp 04/18/22 1031 18     Temp 04/18/22 1031 98.6 F (37 C)     Temp Source 04/18/22 1031 Oral     SpO2 04/18/22 1031 100 %     Weight 04/18/22 1322 198 lb 6.6 oz (90 kg)     Height 04/18/22 1322 6\' 2"  (1.88 m)     Head Circumference --      Peak Flow --      Pain Score 04/18/22 1039 10     Pain Loc --      Pain Edu? --      Excl. in [place]? --     Most recent vital signs: Vitals:   04/18/22 1330 04/18/22 1434  BP: 122/76 (!) 120/59  Pulse: 89 88  Resp: 18 18  Temp:  98.3 F (36.8 C)  SpO2: 96% 97%    General: Awake, no distress.  CV:  Good peripheral perfusion.  Resp:  Normal effort.  Abd:  No distention.   Other:  Left forearm and elbow is edematous and erythematous.  Patient has an open, scabbed wound over the area of the olecranon.  There is no obvious abscess or area of fluctuance.  Area is indurated.  Patient is still able to flex and extend the elbow.   ED Results / Procedures / Treatments   Labs (all labs ordered are listed, but only abnormal results are displayed) Labs Reviewed  COMPREHENSIVE METABOLIC PANEL - Abnormal; Notable for the following components:      Result Value   CO2 20 (*)    Glucose, Bld 217 (*)    Calcium 8.7 (*)    All other components within normal limits  CBC WITH DIFFERENTIAL/PLATELET - Abnormal; Notable for the following components:   WBC 11.1 (*)    Hemoglobin 12.3 (*)    Neutro Abs 9.5 (*)    All other components within normal limits  URINALYSIS, ROUTINE W REFLEX MICROSCOPIC - Abnormal; Notable for the following components:   Color, Urine STRAW (*)    APPearance CLEAR (*)    Glucose, UA >=500 (*)  All other components within normal limits  LACTIC ACID, PLASMA     EKG  Not indicated   RADIOLOGY  Image of the left elbow shows no acute fracture or subluxation.  There is no obvious joint effusion.  No radiopaque foreign body or soft tissue gas noted.  Image and radiology report reviewed and interpreted by me. Radiology report consistent with the same.    PROCEDURES:  Critical Care performed: No  Procedures   MEDICATIONS ORDERED IN ED:  Medications  cefTRIAXone (ROCEPHIN) 2 g in sodium chloride 0.9 % 100 mL IVPB (0 g Intravenous Stopped 04/18/22 1402)  ketorolac (TORADOL) 30 MG/ML injection 30 mg (30 mg Intravenous Given 04/18/22 1331)     IMPRESSION / MDM / ASSESSMENT AND PLAN / ED COURSE   I have reviewed the triage note.  Differential diagnosis includes, but is not limited to, cellulitis, retained foreign body, open fracture, abscess, joint effusion, septic joint  Patient's presentation is most consistent with acute illness /  injury with system symptoms.  52 year old male presenting to the emergency department for treatment and evaluation of the left arm pain, redness, and swelling that has been ongoing for about 6 days.  See HPI for further details.  Labs show very mild leukocytosis at 11.1.  CMP shows a mild hyperglycemia at 217.  Lactic acid is normal.  Urinalysis shows greater than 500 glucose but is otherwise negative.  Labs are overall reassuring.  Patient is still able to fully flex and extend the elbow, white count is only mildly elevated, he is afebrile and not tachycardic.  Do not think that he has a septic joint or is septic in general.  Plan will be to give him IV antibiotics here and then treat him with Bactrim and Keflex as outpatient.  Arm does appear to be painful.  Patient states that he has not had any alcohol in over a month and is not currently using any illicit drugs.  He will be provided with a few tablets of Percocet until antibiotics become effective.  He was advised to use these sparingly and only if over-the-counter medications do not help.  Patient is aware and agreeable to this plan.  He was advised that he needs to be reevaluated in 2 to 3 days for recheck.  He was strongly encouraged to call his primary care provider's office tomorrow to request an appointment.  If he develops any symptoms of worsening or new concerns he is to not to wait to see primary care and return to the emergency department.      FINAL CLINICAL IMPRESSION(S) / ED DIAGNOSES   Final diagnoses:  Cellulitis of left upper extremity     Rx / DC Orders   ED Discharge Orders          Ordered    sulfamethoxazole-trimethoprim (BACTRIM DS) 800-160 MG tablet  2 times daily        04/18/22 1424    cephALEXin (KEFLEX) 500 MG capsule  3 times daily        04/18/22 1424    oxyCODONE-acetaminophen (PERCOCET) 7.5-325 MG tablet  Every 8 hours PRN        04/18/22 1424             Note:  This document was prepared using  Dragon voice recognition software and may include unintentional dictation errors.   Victorino Dike, FNP 04/18/22 1845    Harvest Dark, MD 04/21/22 1545

## 2022-04-18 NOTE — ED Triage Notes (Signed)
Patient to ED for fall that occurred on Wednesday. Patient states the fall occurred when he had a seizure (states he "had 2 in his life".) States he fell onto left elbow. Elbow noted to be red and warm to touch.

## 2022-04-18 NOTE — Discharge Instructions (Signed)
Please take the antibiotic as prescribed.  Call and schedule a follow up appointment with your primary care provider for a recheck in 2 days.  Return to the ER for symptoms that change or worsen if unable to schedule an appointment if you are getting worse.

## 2022-05-10 ENCOUNTER — Encounter: Payer: Self-pay | Admitting: Emergency Medicine

## 2022-05-10 ENCOUNTER — Emergency Department
Admission: EM | Admit: 2022-05-10 | Discharge: 2022-05-10 | Disposition: A | Discharge status: Home / Self Care | Payer: 59 | Attending: Emergency Medicine | Admitting: Emergency Medicine

## 2022-05-10 DIAGNOSIS — I10 Essential (primary) hypertension: Secondary | ICD-10-CM | POA: Insufficient documentation

## 2022-05-10 DIAGNOSIS — I2510 Atherosclerotic heart disease of native coronary artery without angina pectoris: Secondary | ICD-10-CM | POA: Diagnosis not present

## 2022-05-10 DIAGNOSIS — F411 Generalized anxiety disorder: Secondary | ICD-10-CM | POA: Diagnosis not present

## 2022-05-10 DIAGNOSIS — G8929 Other chronic pain: Secondary | ICD-10-CM | POA: Insufficient documentation

## 2022-05-10 DIAGNOSIS — E119 Type 2 diabetes mellitus without complications: Secondary | ICD-10-CM | POA: Diagnosis not present

## 2022-05-10 DIAGNOSIS — R45851 Suicidal ideations: Secondary | ICD-10-CM | POA: Diagnosis not present

## 2022-05-10 DIAGNOSIS — M542 Cervicalgia: Secondary | ICD-10-CM | POA: Insufficient documentation

## 2022-05-10 DIAGNOSIS — E039 Hypothyroidism, unspecified: Secondary | ICD-10-CM | POA: Insufficient documentation

## 2022-05-10 DIAGNOSIS — Z79899 Other long term (current) drug therapy: Secondary | ICD-10-CM

## 2022-05-10 DIAGNOSIS — Z87891 Personal history of nicotine dependence: Secondary | ICD-10-CM | POA: Diagnosis not present

## 2022-05-10 LAB — CBC
HCT: 41.5 % (ref 39.0–52.0)
Hemoglobin: 13.1 g/dL (ref 13.0–17.0)
MCH: 29 pg (ref 26.0–34.0)
MCHC: 31.6 g/dL (ref 30.0–36.0)
MCV: 92 fL (ref 80.0–100.0)
Platelets: 300 10*3/uL (ref 150–400)
RBC: 4.51 MIL/uL (ref 4.22–5.81)
RDW: 14.7 % (ref 11.5–15.5)
WBC: 7.2 10*3/uL (ref 4.0–10.5)
nRBC: 0 % (ref 0.0–0.2)

## 2022-05-10 LAB — COMPREHENSIVE METABOLIC PANEL
ALT: 17 U/L (ref 0–44)
AST: 21 U/L (ref 15–41)
Albumin: 4.3 g/dL (ref 3.5–5.0)
Alkaline Phosphatase: 115 U/L (ref 38–126)
Anion gap: 10 (ref 5–15)
BUN: 16 mg/dL (ref 6–20)
CO2: 21 mmol/L — ABNORMAL LOW (ref 22–32)
Calcium: 9.1 mg/dL (ref 8.9–10.3)
Chloride: 106 mmol/L (ref 98–111)
Creatinine, Ser: 0.89 mg/dL (ref 0.61–1.24)
GFR, Estimated: >60 mL/min (ref >60)
Glucose, Bld: 169 mg/dL — ABNORMAL HIGH (ref 70–99)
Potassium: 4 mmol/L (ref 3.5–5.1)
Sodium: 137 mmol/L (ref 135–145)
Total Bilirubin: 0.8 mg/dL (ref 0.3–1.2)
Total Protein: 7.2 g/dL (ref 6.5–8.1)

## 2022-05-10 LAB — URINE DRUG SCREEN, QUALITATIVE (ARMC ONLY)
Amphetamines, Ur Screen: NOT DETECTED
Barbiturates, Ur Screen: NOT DETECTED
Benzodiazepine, Ur Scrn: NOT DETECTED
Cannabinoid 50 Ng, Ur ~~LOC~~: POSITIVE — AB
Cocaine Metabolite,Ur ~~LOC~~: NOT DETECTED
MDMA (Ecstasy)Ur Screen: NOT DETECTED
Methadone Scn, Ur: NOT DETECTED
Opiate, Ur Screen: NOT DETECTED
Phencyclidine (PCP) Ur S: NOT DETECTED
Tricyclic, Ur Screen: POSITIVE — AB

## 2022-05-10 LAB — ACETAMINOPHEN LEVEL: Acetaminophen (Tylenol), Serum: <10 ug/mL — ABNORMAL LOW (ref 10–30)

## 2022-05-10 LAB — ETHANOL: Alcohol, Ethyl (B): <10 mg/dL (ref <10)

## 2022-05-10 LAB — SALICYLATE LEVEL: Salicylate Lvl: <7 mg/dL — ABNORMAL LOW (ref 7.0–30.0)

## 2022-05-10 MED ORDER — LORAZEPAM 1 MG PO TABS
1.0000 mg | ORAL_TABLET | Freq: Once | ORAL | Status: AC
  Administered 2022-05-10: 1 mg via ORAL
  Filled 2022-05-10: qty 1

## 2022-05-10 NOTE — ED Notes (Signed)
Declined breakfast when offered. Pt with intermittent outbursts of yelling. States, " I need help, where is the doctor?"

## 2022-05-10 NOTE — ED Triage Notes (Signed)
Pt arrives via EMS with chronic neck pain and depression. Pt reports SI due to the pain and a hx of trying to kill himself about 8-9 years ago. Denies etoh or drug use.

## 2022-05-10 NOTE — ED Provider Notes (Signed)
[facility] Provider Note   Event Date/Time   First MD Initiated Contact with Patient 05/10/22 0830     (approximate) History  Suicidal  HPI Teoman Zupon is a 52 y.o. male with a past medical history of chronic neck pain, alcohol abuse, type 2 diabetes, and generalized anxiety disorder who presents complaining of chronic neck pain and depression.  Patient endorses suicidal ideation with a plan to overdose on medications.  Patient states that he is suicidal secondary to the neck pain and also feels that "my head is not the same".  Patient currently denies any homicidal ideation or auditory/visual hallucinations.  Patient states that symptoms began after stopping his Klonopin approximately 1 week prior to arrival. ROS: Patient currently denies any vision changes, tinnitus, difficulty speaking, facial droop, sore throat, chest pain, shortness of breath, abdominal pain, nausea/vomiting/diarrhea, dysuria, or weakness/numbness/paresthesias in any extremity   Physical Exam  Triage Vital Signs: ED Triage Vitals  Enc Vitals Group     BP 05/10/22 0819 (!) 144/118     Pulse Rate 05/10/22 0819 91     Resp 05/10/22 0819 18     Temp 05/10/22 0819 97.8 F (36.6 C)     Temp src --      SpO2 05/10/22 0818 97 %     Weight 05/10/22 0820 198 lb 6.6 oz (90 kg)     Height 05/10/22 0820 6' 2"$  (1.88 m)     Head Circumference --      Peak Flow --      Pain Score 05/10/22 0820 10     Pain Loc --      Pain Edu? --      Excl. in [place]? --    Most recent vital signs: Vitals:   05/10/22 0818 05/10/22 0819  BP:  (!) 144/118  Pulse:  91  Resp:  18  Temp:  97.8 F (36.6 C)  SpO2: 97% 93%   General: Awake, oriented x4. CV:  Good peripheral perfusion.  Resp:  Normal effort.  Abd:  No distention.  Other:  Middle-aged Caucasian male laying in bed in no acute distress ED Results / Procedures / Treatments  Labs (all labs ordered are listed, but only abnormal results are  displayed) Labs Reviewed  COMPREHENSIVE METABOLIC PANEL - Abnormal; Notable for the following components:      Result Value   CO2 21 (*)    Glucose, Bld 169 (*)    All other components within normal limits  SALICYLATE LEVEL - Abnormal; Notable for the following components:   Salicylate Lvl [id number] (*)    All other components within normal limits  ACETAMINOPHEN LEVEL - Abnormal; Notable for the following components:   Acetaminophen (Tylenol), Serum <10 (*)    All other components within normal limits  ETHANOL  CBC  URINE DRUG SCREEN, QUALITATIVE ([facility] ONLY)   PROCEDURES: Critical Care performed: No Procedures MEDICATIONS ORDERED IN ED: Medications  LORazepam (ATIVAN) tablet 1 mg (1 mg Oral Given 05/10/22 0930)   IMPRESSION / MDM / ASSESSMENT AND PLAN / ED COURSE  I reviewed the triage vital signs and the nursing notes.                             The patient is on the cardiac monitor to evaluate for evidence of arrhythmia and/or significant heart rate changes. Patient's presentation is most consistent with acute presentation with potential threat to life or bodily  function. Thoughts are linear and organized, and patient has no AH, VH, or HI. Prior suicide attempt 8-9 years ago Prior Psychiatric Hospitalizations: Multiple  Clinically patient displays no overt toxidrome; they are well appearing, with low suspicion for toxic ingestion given history and exam. Thoughts unlikely 2/2 anemia, hypothyroidism, infection, or ICH.  Consult: Psychiatry to evaluate patient for potential hold for danger to self.  [Seen by both teams] Discharge. Patient evaluated by both emergency and psychiatric professionals and deemed safe at this time for discharge. They have a plan for psychiatric follow up and a safe place to stay with access to physical and emotional support.  FINAL CLINICAL IMPRESSION(S) / ED DIAGNOSES   Final diagnoses:  Suicidal ideation  Chronic neck pain   Rx / DC Orders   ED  Discharge Orders     None      Note:  This document was prepared using Dragon voice recognition software and may include unintentional dictation errors.   Naaman Plummer, MD 05/10/22 [phone]

## 2022-05-10 NOTE — ED Notes (Signed)
Aware of need for urine sample, pt understands. Given blanket and water. Contracts for safety in the ED.

## 2022-05-10 NOTE — Consult Note (Signed)
[facility] Consult   Reason for Consult: Suicidal  Referring Physician:  Dr. Cheri Fowler  Patient Identification: Michael Schmitt MRN:  [id number] Principal Diagnosis: Suicidal ideation Diagnosis:  Principal Problem:   Chronic Suicidal ideation Active Problems:   Long term prescription benzodiazepine use   Generalized anxiety disorder   Total Time spent with patient: 45 minutes  Subjective:   Michael Schmitt is a 52 y.o. male patient admitted with chronic neck pain and depression.  Chart reviewed and patient was seen face-to-face. Upon evaluation, patient was observed pacing throughout assigned room. Patient reports he woke up this morning feeling suicidal and in pain. Patient reports that he was "weaned off" Klonopin a month or 2 ago, and he has not had any pain relief since then. Patient denies experiencing withdrawal symptoms but states his "head does not feel right". Patient reports he is unable to sleep well due to neck cramping and pain. Patient endorses passive suicidal ideation, without intent. He reports living with his mother, 2 children, and sister. There are no weapons reported in the home.  Patient reports he is currently on multiple medications for his mental health, and reports he has been taking these medications regularly. The patient reports that he was "doing fine until I started being weaned off of Klonopin". He reports he has an appointment tomorrow to see a therapist virtually, and reports that he has regular visits with [facility], in [place] with his last appointment being 2 weeks ago. Patient denies homicidal ideations, and auditory or visual hallucinations. He does not appear to be responding to internal or external stimuli, nor any evidence of delusional thinking noted on encounter. Patient reports his primary concern is neck pain, which has been ongoing since 2013, and is unsure if resolving neck pain would alleviate his other symptoms. Patient  denies alcohol or illicit drug use. UDS (+) TSH. ETOH <10.    HPI:  Per Dr. Cheri Fowler, Michael Schmitt is a 52 y.o. male with a past medical history of chronic neck pain, alcohol abuse, type 2 diabetes, and generalized anxiety disorder who presents complaining of chronic neck pain and depression. Patient endorses suicidal ideation with a plan to overdose on medications. Patient states that he is suicidal secondary to the neck pain and also feels that "my head is not the same".  Patient currently denies any homicidal ideation or auditory/visual hallucinations.  Patient states that symptoms began after stopping his Klonopin approximately 1 week prior to arrival. ROS: Patient currently denies any vision changes, tinnitus, difficulty speaking, facial droop, sore throat, chest pain, shortness of breath, abdominal pain, nausea/vomiting/diarrhea, dysuria, or weakness/numbness/paresthesias in any extremity   Past Psychiatric History:  Anxiety  Bipolar 1 disorder Depression   Risk to Self:  No  Risk to Others:  No  Prior Inpatient Therapy:  Yes  Prior Outpatient Therapy:  Yes   Past Medical History:  Past Medical History:  Diagnosis Date   Anxiety    Anxiety    Arthritis    cerv. stenosis, spondylosis, HNP- lower back , has been followed in pain clinic, has  had injection s in cerv. 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 Cervical three-four.   CARDIAC CATHETERIZATION N/A 01/01/2015   Procedure: Left Heart Cath and Coronary Angiography;  Surgeon: [name], MD;  Location: [facility];  Service: Cardiovascular;  Laterality: N/A;   CARDIAC CATHETERIZATION N/A 04/05/2016   Procedure: Left Heart Cath and Coronary Angiography;  Surgeon: Belva Crome, MD;  Location: [facility];  Service: Cardiovascular;  Laterality: N/A;   CARDIAC CATHETERIZATION N/A 04/05/2016   Procedure: Coronary Stent Intervention;  Surgeon: Belva Crome, MD;  Location: [facility];  Service: Cardiovascular;  Laterality: N/A;   CARDIAC CATHETERIZATION N/A 04/05/2016   Procedure: Intravascular Ultrasound/IVUS;  Surgeon: Belva Crome, MD;  Location: [facility];  Service: Cardiovascular;  Laterality: N/A;   NASAL SINUS SURGERY     2005   Family History:  Family History  Problem Relation Age of Onset   Prostate cancer Father    Hypertension Mother    Kidney Stones Mother    Anxiety disorder Mother    Depression Mother    COPD Sister    Hypertension Sister    Diabetes Sister    Depression Sister    Anxiety disorder Sister    Seizures Sister    ADD / ADHD Son    ADD / ADHD Daughter    Family Psychiatric  History: Unknown  Social History:  Social History   Substance and Sexual Activity  Alcohol Use No   Alcohol/week: 0.0 standard drinks of alcohol   Comment: last drinked in 3 months.      Social History   Substance and Sexual Activity  Drug Use No    Social History   Socioeconomic History    Marital status: Single    Spouse name: Not on file   Number of children: 3   Years of education: 12   Highest education level: Not on file  Occupational History    Comment: unemployed  Tobacco Use   Smoking status: Every Day    Packs/day: 2.00    Years: 17.00    Total pack years: 34.00    Types: Cigarettes, Cigars   Smokeless tobacco: Former   Tobacco comments:    occassional snuff   Vaping Use   Vaping Use: Never used  Substance and Sexual Activity   Alcohol use: No    Alcohol/week: 0.0 standard drinks of alcohol    Comment: last drinked in 3 months.    Drug use: No   Sexual activity: Not Currently  Other Topics Concern   Not on file  Social History Narrative   Patient is right handed.   Patient drinks 2 sodas daily.   Social Determinants of Health   Financial Resource Strain: Not on file  Food Insecurity: Not on file  Transportation Needs: Not on file  Physical Activity: Not on file  Stress: Not on file  Social Connections: Not on file   Additional Social History:    Allergies:   Allergies  Allergen Reactions   Prednisone Other (See Comments)    Hypertension and makes him feel "spacey"   Amoxicillin Other (See Comments)    Other Reaction: ELEVATED BP   Hydrocodone Other (See Comments)    Causes headaches and makes the patient irritable   Varenicline Other (See Comments)    Suicidal thoughts   Wellbutrin [Bupropion]     Seizures    Other Anxiety    Other reaction(s): Other (See Comments) Patient stated that this keeps him awake; "has an opposite effect on me"   Trazodone And Nefazodone Anxiety and Other (See Comments)    Patient stated that this keeps him awake; "has an opposite effect on me"    Labs:  Results for orders placed or performed during the hospital encounter of 05/10/22 (from the past 48 hour(s))  Comprehensive metabolic panel     Status: Abnormal   Collection Time: 05/10/22  8:24 AM  Result Value Ref Range   Sodium 137 135 - 145 mmol/L    Potassium 4.0 3.5 - 5.1 mmol/L   Chloride 106 98 - 111 mmol/L   CO2 21 (L) 22 - 32 mmol/L   Glucose, Bld 169 (H) 70 - 99 mg/dL    Comment: Glucose reference range applies only to samples taken after fasting for at least 8 hours.   BUN 16 6 - 20 mg/dL   Creatinine, Ser 0.89 0.61 - 1.24 mg/dL   Calcium 9.1 8.9 - 10.3 mg/dL   Total Protein 7.2 6.5 - 8.1 g/dL   Albumin 4.3 3.5 - 5.0 g/dL   AST 21 15 - 41 U/L   ALT 17 0 - 44 U/L   Alkaline Phosphatase 115 38 - 126 U/L   Total Bilirubin 0.8 0.3 - 1.2 mg/dL   GFR, Estimated >60 >60 mL/min    Comment: (NOTE) Calculated using the CKD-EPI Creatinine Equation (2021)    Anion gap 10 5 - 15    Comment: Performed at [facility], [address] [place], [place] [address]  Ethanol     Status: None   Collection Time: 05/10/22  8:24 AM  Result Value Ref Range   Alcohol, Ethyl (B) <10 <10 mg/dL    Comment: (NOTE) Lowest detectable limit for serum alcohol is 10 mg/dL.  For medical purposes only. Performed at [facility], [address] [place], [place] [id number]   Salicylate level     Status: Abnormal   Collection Time: 05/10/22  8:24 AM  Result Value Ref Range   Salicylate Lvl [id number] (L) 7.0 - 30.0 mg/dL    Comment: Performed at [facility], [address] [place], [place] [address]  Acetaminophen level     Status: Abnormal   Collection Time: 05/10/22  8:24 AM  Result Value Ref Range   Acetaminophen (Tylenol), Serum <10 (L) 10 - 30 ug/mL    Comment: (NOTE) Therapeutic concentrations vary significantly. A range of 10-30 ug/mL  may be an effective concentration for many patients. However, some  are best treated at concentrations outside of this range. Acetaminophen concentrations >150 ug/mL at 4 hours after ingestion  and >50 ug/mL at 12 hours after ingestion are often associated with  toxic reactions.  Performed at [facility], [address], [place], [place] [address]   cbc  Status:  None   Collection Time: 05/10/22  8:24 AM  Result Value Ref Range   WBC 7.2 4.0 - 10.5 K/uL   RBC 4.51 4.22 - 5.81 MIL/uL   Hemoglobin 13.1 13.0 - 17.0 g/dL   HCT 41.5 39.0 - 52.0 %   MCV 92.0 80.0 - 100.0 fL   MCH 29.0 26.0 - 34.0 pg   MCHC 31.6 30.0 - 36.0 g/dL   RDW 14.7 11.5 - 15.5 %   Platelets 300 150 - 400 K/uL   nRBC 0.0 0.0 - 0.2 %    Comment: Performed at [facility], [address] [place], [place] [address]  Urine Drug Screen, Qualitative     Status: Abnormal   Collection Time: 05/10/22  8:27 AM  Result Value Ref Range   Tricyclic, Ur Screen POSITIVE (A) NONE DETECTED   Amphetamines, Ur Screen NONE DETECTED NONE DETECTED   MDMA (Ecstasy)Ur Screen NONE DETECTED NONE DETECTED   Cocaine Metabolite,Ur [place] NONE DETECTED NONE DETECTED   Opiate, Ur Screen NONE DETECTED NONE DETECTED   Phencyclidine (PCP) Ur S NONE DETECTED NONE DETECTED   Cannabinoid 50 Ng, Ur [place] POSITIVE (A) NONE DETECTED   Barbiturates, Ur Screen NONE DETECTED NONE DETECTED   Benzodiazepine, Ur Scrn NONE DETECTED NONE DETECTED   Methadone Scn, Ur NONE DETECTED NONE DETECTED    Comment: (NOTE) Tricyclics + metabolites, urine    Cutoff 1000 ng/mL Amphetamines + metabolites, urine  Cutoff 1000 ng/mL MDMA (Ecstasy), urine              Cutoff 500 ng/mL Cocaine Metabolite, urine          Cutoff 300 ng/mL Opiate + metabolites, urine        Cutoff 300 ng/mL Phencyclidine (PCP), urine         Cutoff 25 ng/mL Cannabinoid, urine                 Cutoff 50 ng/mL Barbiturates + metabolites, urine  Cutoff 200 ng/mL Benzodiazepine, urine              Cutoff 200 ng/mL Methadone, urine                   Cutoff 300 ng/mL  The urine drug screen provides only a preliminary, unconfirmed analytical test result and should not be used for non-medical purposes. Clinical consideration and professional judgment should be applied to any positive drug screen result due to possible interfering substances. A more  specific alternate chemical method must be used in order to obtain a confirmed analytical result. Gas chromatography / mass spectrometry (GC/MS) is the preferred confirm atory method. Performed at [facility], [address] [place], [place] [address]     No current facility-administered medications for this encounter.   Current Outpatient Medications  Medication Sig Dispense Refill   acamprosate (CAMPRAL) 333 MG tablet Take 666 mg by mouth 3 (three) times daily with meals.     amLODipine (NORVASC) 5 MG tablet Take 5 mg by mouth daily.     atorvastatin (LIPITOR) 20 MG tablet Take 20 mg by mouth daily.     buPROPion (WELLBUTRIN SR) 150 MG 12 hr tablet Take 150 mg by mouth daily. (Patient not taking: Reported on 02/24/2022)     carvedilol (COREG) 6.25 MG tablet Take 6.25 mg by mouth 2 (two) times daily with a meal.     chlorproMAZINE (THORAZINE) 200 MG tablet Take 1 tablet by mouth in the morning, at noon, and  at bedtime.     clopidogrel (PLAVIX) 75 MG tablet Take 75 mg by mouth daily.     cyclobenzaprine (FLEXERIL) 10 MG tablet Take 10 mg by mouth 3 (three) times daily as needed for muscle spasms.     divalproex (DEPAKOTE) 500 MG DR tablet Take 1,500 mg by mouth at bedtime.     doxepin (SINEQUAN) 25 MG capsule Take 25 mg by mouth.     DULoxetine (CYMBALTA) 60 MG capsule Take 60 mg by mouth at bedtime.     escitalopram (LEXAPRO) 10 MG tablet Take 1 tablet (10 mg total) by mouth daily. 30 tablet 2   gabapentin (NEURONTIN) 300 MG capsule Take 300 mg by mouth 3 (three) times daily.     glipiZIDE (GLUCOTROL XL) 10 MG 24 hr tablet Take 10 mg by mouth daily with breakfast.     haloperidol (HALDOL) 5 MG tablet Take 5 mg by mouth 2 (two) times daily.     LORazepam (ATIVAN) 1 MG tablet Take 1 tablet (1 mg total) by mouth 2 (two) times daily. 2 tablet 0   metFORMIN (GLUCOPHAGE) 500 MG tablet Take 1,000 mg by mouth 2 (two) times daily.     methocarbamol (ROBAXIN) 500 MG tablet Take 500 mg  by mouth every 6 (six) hours as needed for muscle spasms.     methylPREDNISolone (MEDROL DOSEPAK) 4 MG TBPK tablet Take by mouth as directed. (Patient not taking: Reported on 02/24/2022)     mirtazapine (REMERON) 15 MG tablet Take 15 mg by mouth at bedtime.     oxyCODONE-acetaminophen (PERCOCET) 7.5-325 MG tablet Take 1 tablet by mouth every 8 (eight) hours as needed for severe pain. 12 tablet 0   pantoprazole (PROTONIX) 40 MG tablet Take 1 tablet by mouth daily.     propranolol (INDERAL) 10 MG tablet Take 1 tablet (10 mg total) by mouth 2 (two) times daily. 60 tablet 2   QUEtiapine (SEROQUEL) 400 MG tablet Take 2 tablets (800 mg total) by mouth at bedtime. 30 tablet 2    Musculoskeletal: Strength & Muscle Tone: within normal limits Gait & Station: normal Patient leans: N/A            Psychiatric Specialty Exam:  Presentation  General Appearance:  Casual  Eye Contact: Good  Speech: Clear and Coherent  Speech Volume: Normal  Handedness: Right   Mood and Affect  Mood: Anxious  Affect: Blunt   Thought Process  Thought Processes: Coherent  Descriptions of Associations:Intact  Orientation:Full (Time, Place and Person)  Thought Content:Logical  History of Schizophrenia/Schizoaffective disorder:No  Duration of Psychotic Symptoms:No data recorded Hallucinations:Hallucinations: None  Ideas of Reference:None  Suicidal Thoughts:Suicidal Thoughts: Yes, Passive SI Passive Intent and/or Plan: Without Intent  Homicidal Thoughts:No data recorded  Sensorium  Memory: Immediate Good  Judgment: Good  Insight: Good   Executive Functions  Concentration: Good  Attention Span: Good  Recall: Good  Fund of Knowledge: Good  Language: Good   Psychomotor Activity  Psychomotor Activity:Psychomotor Activity: Restlessness   Assets  Assets: Financial Resources/Insurance; Housing; Resilience; Social Support   Sleep  Sleep:Sleep:  Good   Physical Exam: Physical Exam Constitutional:      Appearance: Normal appearance.  HENT:     Head: Normocephalic and atraumatic.     Nose: Nose normal.  Pulmonary:     Effort: Pulmonary effort is normal.  Musculoskeletal:        General: Normal range of motion.     Cervical back: Normal range of motion.  Neurological:  General: No focal deficit present.     Mental Status: He is alert and oriented to person, place, and time.  Psychiatric:        Mood and Affect: Mood normal.    Review of Systems  Constitutional: Negative.   HENT: Negative.    Respiratory: Negative.    Psychiatric/Behavioral:  Positive for depression and substance abuse. The patient is nervous/anxious.    Blood pressure (!) 141/96, pulse 74, temperature 98 F (36.7 C), temperature source Oral, resp. rate 16, height 6' 2"$  (1.88 m), weight 90 kg, SpO2 98 %. Body mass index is 25.47 kg/m.  Treatment Plan Summary: Plan the patient is not a safety risk to himself or others and does not require psychiatric inpatient admission for stabilization and treatment. The patient encouraged to continue current medication regimen, and follow-up with [facility] regarding concerns for restarting Klonopin therapy. Reviewed with Dr. Cheri Fowler, EDP   Disposition:  No evidence of imminent risk to self or others at present.   Patient does not meet criteria for psychiatric inpatient admission. Supportive therapy provided about ongoing stressors. Discussed crisis plan, support from social network, calling 911, coming to the Emergency Department, and calling Suicide Hotline.  Ronny Flurry, NP 05/10/2022 3:39 PM

## 2022-05-10 NOTE — ED Notes (Addendum)
Lunch tray given to pt. Pt refused tray at this time. Tray left at bedside for pt

## 2022-05-10 NOTE — BH Assessment (Signed)
Comprehensive Clinical Assessment (CCA) Screening, Triage and Referral Note  05/10/2022 Michael Schmitt [id number]  Michael Schmitt, 52 year old male who presents to [facility] ED voluntarily for treatment. Per triage note, Pt arrives via EMS with chronic neck pain and depression. Pt reports SI due to the pain and a hx of trying to kill himself about 8-9 years ago. Denies etoh or drug use.   During TTS assessment pt presents alert and oriented x 4, restless but cooperative, and mood-congruent with affect. The pt does not appear to be responding to internal or external stimuli. Neither is the pt presenting with any delusional thinking. Pt verified the information provided to triage RN.   Pt identifies his main complaint to be that he has chronic neck pain since 2013. Patient reports he woke up screaming and his roommate called 911. Patient reports the pain has worsened since coming off Klonopin 2 months ago. Patient states he is not able to sleep because of the pain and wants some type of relief. Patient endorsing passive SI. Patient states he is compliant with his psych medications and takes them as prescribed. Patient reports he has a virtual appointment on tomm to see a therapist. Patient states he is being followed by providers at [facility] in [place], [place]. "I just want to feel better so I can be with my kids." Pt denies current HI/AH/VH. Patient denies using any illicit substances and alcohol.   Per Christal, NP, pt does not meet criteria for inpatient psychiatric admission.   Chief Complaint:  Chief Complaint  Patient presents with   Suicidal   Visit Diagnosis: Suicidal ideation  Patient Reported Information How did you hear about [place]? -- (EMS)  What Is the Reason for Your Visit/Call Today? Patient was brought to the ED due to chronic neck pain.  How Long Has This Been Causing You Problems? > than 6 months  What Do You Feel Would Help You the Most Today? Medication(s)   Have You  Recently Had Any Thoughts About Hurting Yourself? Yes  Are You Planning to Commit Suicide/Harm Yourself At This time? No   Have you Recently Had Thoughts About [place]? No  Are You Planning to Harm Someone at This Time? No  Explanation: n/a   Have You Used Any Alcohol or Drugs in the Past 24 Hours? No  How Long Ago Did You Use Drugs or Alcohol? No data recorded What Did You Use and How Much? n/a   Do You Currently Have a Therapist/Psychiatrist? Yes  Name of Therapist/Psychiatrist: [facility]   Have You Been Recently Discharged From Any Office Practice or Programs? No  Explanation of Discharge From Practice/Program: Discharged from [facility] in March 2023    CCA Screening Triage Referral Assessment Type of Contact: Face-to-Face  Telemedicine Service Delivery:   Is this Initial or Reassessment?   Date Telepsych consult ordered in [facility]:    Time Telepsych consult ordered in [facility]:    Location of Assessment: [facility] ED  Provider Location: [facility] ED    Collateral Involvement: None   Does Patient Have a [place]? No data recorded Name and Contact of Legal Guardian: No data recorded If Minor and Not Living with Parent(s), Who has Custody? NA  Is CPS involved or ever been involved? Never  Is APS involved or ever been involved? Never   Patient Determined To Be At Risk for Harm To Self or Others Based on Review of Patient Reported Information or Presenting Complaint? No  Method:  No Plan  Availability of Means: No access or NA  Intent: Clearly intends on inflicting harm that could cause death  Notification Required: No need or identified person  Additional Information for Danger to Others Potential: Previous attempts  Additional Comments for Danger to Others Potential: No data recorded Are There Guns or Other Weapons in Your Home? No  Types of Guns/Weapons: No data recorded Are These Weapons Safely Secured?                             No  Who Could Verify You Are Able To Have These Secured: No data recorded Do You Have any Outstanding Charges, Pending Court Dates, Parole/Probation? No data recorded Contacted To Inform of Risk of Harm To Self or Others: Other: Comment   Does Patient Present under Involuntary Commitment? No    [place] of Residence: [place]   Patient Currently Receiving the Following Services: Medication Management; Individual Therapy   Determination of Need: Emergent (2 hours)   Options For Referral: ED Visit; Outpatient Therapy; Medication Management   Discharge Disposition:     Eula Fried, Counselor, LCAS-A

## 2022-05-10 NOTE — ED Notes (Signed)
Verified correct patient and correct discharge papers given. Pt alert and oriented X 4, stable for discharge. RR even and unlabored, color WNL. Discussed discharge instructions and follow-up as directed. Discharge medications discussed, when prescribed. Pt had opportunity to ask questions, and RN available to provide patient and/or family education. Left with all of belongings.

## 2022-05-11 DIAGNOSIS — F13939 Sedative, hypnotic or anxiolytic use, unspecified with withdrawal, unspecified: Secondary | ICD-10-CM | POA: Insufficient documentation

## 2022-06-10 ENCOUNTER — Emergency Department: Payer: 59

## 2022-06-10 ENCOUNTER — Other Ambulatory Visit: Payer: Self-pay

## 2022-06-10 ENCOUNTER — Emergency Department
Admission: EM | Admit: 2022-06-10 | Discharge: 2022-06-10 | Disposition: A | Discharge status: Home / Self Care | Payer: 59 | Attending: Emergency Medicine | Admitting: Emergency Medicine

## 2022-06-10 DIAGNOSIS — I2510 Atherosclerotic heart disease of native coronary artery without angina pectoris: Secondary | ICD-10-CM | POA: Insufficient documentation

## 2022-06-10 DIAGNOSIS — I4891 Unspecified atrial fibrillation: Secondary | ICD-10-CM | POA: Insufficient documentation

## 2022-06-10 DIAGNOSIS — E1122 Type 2 diabetes mellitus with diabetic chronic kidney disease: Secondary | ICD-10-CM | POA: Insufficient documentation

## 2022-06-10 DIAGNOSIS — I129 Hypertensive chronic kidney disease with stage 1 through stage 4 chronic kidney disease, or unspecified chronic kidney disease: Secondary | ICD-10-CM | POA: Diagnosis not present

## 2022-06-10 DIAGNOSIS — N189 Chronic kidney disease, unspecified: Secondary | ICD-10-CM | POA: Diagnosis not present

## 2022-06-10 DIAGNOSIS — G459 Transient cerebral ischemic attack, unspecified: Secondary | ICD-10-CM | POA: Diagnosis not present

## 2022-06-10 DIAGNOSIS — R4781 Slurred speech: Secondary | ICD-10-CM | POA: Insufficient documentation

## 2022-06-10 DIAGNOSIS — R42 Dizziness and giddiness: Secondary | ICD-10-CM | POA: Diagnosis present

## 2022-06-10 LAB — HEPATIC FUNCTION PANEL
ALT: 16 U/L (ref 0–44)
AST: 17 U/L (ref 15–41)
Albumin: 3.7 g/dL (ref 3.5–5.0)
Alkaline Phosphatase: 93 U/L (ref 38–126)
Bilirubin, Direct: <0.1 mg/dL (ref 0.0–0.2)
Total Bilirubin: 0.3 mg/dL (ref 0.3–1.2)
Total Protein: 6.8 g/dL (ref 6.5–8.1)

## 2022-06-10 LAB — CBC
HCT: 39.8 % (ref 39.0–52.0)
Hemoglobin: 12.4 g/dL — ABNORMAL LOW (ref 13.0–17.0)
MCH: 28.3 pg (ref 26.0–34.0)
MCHC: 31.2 g/dL (ref 30.0–36.0)
MCV: 90.9 fL (ref 80.0–100.0)
Platelets: 293 10*3/uL (ref 150–400)
RBC: 4.38 MIL/uL (ref 4.22–5.81)
RDW: 14.9 % (ref 11.5–15.5)
WBC: 7.3 10*3/uL (ref 4.0–10.5)
nRBC: 0 % (ref 0.0–0.2)

## 2022-06-10 LAB — TROPONIN I (HIGH SENSITIVITY): Troponin I (High Sensitivity): 4 ng/L (ref <18)

## 2022-06-10 LAB — BASIC METABOLIC PANEL
Anion gap: 6 (ref 5–15)
BUN: 12 mg/dL (ref 6–20)
CO2: 23 mmol/L (ref 22–32)
Calcium: 8.8 mg/dL — ABNORMAL LOW (ref 8.9–10.3)
Chloride: 107 mmol/L (ref 98–111)
Creatinine, Ser: 1 mg/dL (ref 0.61–1.24)
GFR, Estimated: >60 mL/min (ref >60)
Glucose, Bld: 214 mg/dL — ABNORMAL HIGH (ref 70–99)
Potassium: 4.1 mmol/L (ref 3.5–5.1)
Sodium: 136 mmol/L (ref 135–145)

## 2022-06-10 MED ORDER — AMLODIPINE BESYLATE 5 MG PO TABS: 10.0000 mg | ORAL_TABLET | Freq: Every day | ORAL | 0 refills | Status: DC

## 2022-06-10 NOTE — Discharge Instructions (Signed)
Follow up with your regular doctor  Increase your blood pressure medication; amlodipine 10 mg per day Ask for referral to neurology

## 2022-06-10 NOTE — ED Triage Notes (Signed)
Pt to ED via POV from home. Pt reports he starting having slurred speech, dizziness and frequent falls two days ago. Pt called his PCP and referred him to come in for stroke work up. Pt denies hx of stroke. Pt on plavix. Pt with hx MI.   Pt denies any symptoms at this time.

## 2022-06-10 NOTE — ED Provider Notes (Signed)
[facility] Provider Note    Event Date/Time   First MD Initiated Contact with Patient 06/10/22 1209     (approximate)   History   Dizziness and Aphasia (2 days ago )   HPI  Kashmir Sonderegger is a 52 y.o. male with history of diabetes, CKD, A-fib, CAD, MI, hypertension, and bipolar disorder presents emergency department complaining of slurred speech 2 days ago.  States he felt real dizzy 2 days ago.  Called his doctor and they wanted him to come the emergency department with because they think he may have had a stroke.  States he is not having slurred speech now.  Still has a little dizziness but states it does feel better.  No vomiting or diarrhea.  No chest pain or shortness of breath.      Physical Exam   Triage Vital Signs: ED Triage Vitals  Enc Vitals Group     BP 06/10/22 1203 (!) 151/96     Pulse Rate 06/10/22 1203 91     Resp 06/10/22 1203 18     Temp 06/10/22 1203 98 F (36.7 C)     Temp Source 06/10/22 1203 Oral     SpO2 06/10/22 1203 99 %     Weight --      Height --      Head Circumference --      Peak Flow --      Pain Score 06/10/22 1200 0     Pain Loc --      Pain Edu? --      Excl. in [place]? --     Most recent vital signs: Vitals:   06/10/22 1203  BP: (!) 151/96  Pulse: 91  Resp: 18  Temp: 98 F (36.7 C)  SpO2: 99%     General: Awake, no distress.   CV:  Good peripheral perfusion. regular rate and  rhythm Resp:  Normal effort. Lungs cta Abd:  No distention.   Other:  Cranial nerves II through XII grossly intact, neuro exam does not show any deficiencies   ED Results / Procedures / Treatments   Labs (all labs ordered are listed, but only abnormal results are displayed) Labs Reviewed  BASIC METABOLIC PANEL - Abnormal; Notable for the following components:      Result Value   Glucose, Bld 214 (*)    Calcium 8.8 (*)    All other components within normal limits  CBC - Abnormal; Notable for the following  components:   Hemoglobin 12.4 (*)    All other components within normal limits  HEPATIC FUNCTION PANEL  URINALYSIS, ROUTINE W REFLEX MICROSCOPIC  TROPONIN I (HIGH SENSITIVITY)  TROPONIN I (HIGH SENSITIVITY)     EKG  EKG  RADIOLOGY Ct head    PROCEDURES:   Procedures   MEDICATIONS ORDERED IN ED: Medications - No data to display   IMPRESSION / MDM / [place] / ED COURSE  I reviewed the triage vital signs and the nursing notes.                              Differential diagnosis includes, but is not limited to, CVA, TIA, dizziness, anemia  Patient's presentation is most consistent with acute presentation with potential threat to life or bodily function.   EKG shows normal sinus rhythm, right bundle branch block, ventricular rate of 92 bpm, PR interval 154, QRS is 122, when compared with the  last EKG appears to be very little deviation.  Labs and imaging ordered   Labs reassuring CT head, independently reviewed and interpreted by me as being negative  I did explain all findings to the patient.  He is concerned that his blood pressures been high.  He can add an additional amlodipine to increase this dose to 10 mg daily.  Due to his multiple medications for psych I feel that he should follow-up with his psychiatrist and neurology.  Could be that the medications caused him to have slurred speech.  He is in agreement treatment plan.  Did caution this could have been a TIA.  He could also see neurology for this.  He was discharged stable condition.   FINAL CLINICAL IMPRESSION(S) / ED DIAGNOSES   Final diagnoses:  Dizziness  TIA (transient ischemic attack)     Rx / DC Orders   ED Discharge Orders          Ordered    amLODipine (NORVASC) 5 MG tablet  Daily        06/10/22 1352             Note:  This document was prepared using Dragon voice recognition software and may include unintentional dictation errors.    Versie Starks, PA-C 06/10/22  [id number]    Blake Divine, MD 06/10/22 [phone]

## 2022-06-23 ENCOUNTER — Other Ambulatory Visit: Payer: Self-pay

## 2022-06-23 ENCOUNTER — Emergency Department
Admission: EM | Admit: 2022-06-23 | Discharge: 2022-06-23 | Disposition: A | Discharge status: Home / Self Care | Payer: 59 | Attending: Emergency Medicine | Admitting: Emergency Medicine

## 2022-06-23 DIAGNOSIS — F918 Other conduct disorders: Secondary | ICD-10-CM | POA: Diagnosis not present

## 2022-06-23 DIAGNOSIS — G8929 Other chronic pain: Secondary | ICD-10-CM | POA: Diagnosis not present

## 2022-06-23 DIAGNOSIS — N189 Chronic kidney disease, unspecified: Secondary | ICD-10-CM | POA: Insufficient documentation

## 2022-06-23 DIAGNOSIS — I129 Hypertensive chronic kidney disease with stage 1 through stage 4 chronic kidney disease, or unspecified chronic kidney disease: Secondary | ICD-10-CM | POA: Insufficient documentation

## 2022-06-23 DIAGNOSIS — F4389 Other reactions to severe stress: Secondary | ICD-10-CM | POA: Diagnosis not present

## 2022-06-23 DIAGNOSIS — I2510 Atherosclerotic heart disease of native coronary artery without angina pectoris: Secondary | ICD-10-CM | POA: Diagnosis not present

## 2022-06-23 DIAGNOSIS — M542 Cervicalgia: Secondary | ICD-10-CM | POA: Insufficient documentation

## 2022-06-23 DIAGNOSIS — F419 Anxiety disorder, unspecified: Secondary | ICD-10-CM | POA: Diagnosis not present

## 2022-06-23 DIAGNOSIS — R45851 Suicidal ideations: Secondary | ICD-10-CM | POA: Insufficient documentation

## 2022-06-23 DIAGNOSIS — E1122 Type 2 diabetes mellitus with diabetic chronic kidney disease: Secondary | ICD-10-CM | POA: Diagnosis not present

## 2022-06-23 DIAGNOSIS — F17210 Nicotine dependence, cigarettes, uncomplicated: Secondary | ICD-10-CM | POA: Insufficient documentation

## 2022-06-23 DIAGNOSIS — F430 Acute stress reaction: Secondary | ICD-10-CM | POA: Diagnosis present

## 2022-06-23 DIAGNOSIS — F4325 Adjustment disorder with mixed disturbance of emotions and conduct: Secondary | ICD-10-CM | POA: Insufficient documentation

## 2022-06-23 LAB — ACETAMINOPHEN LEVEL: Acetaminophen (Tylenol), Serum: 10 ug/mL (ref 10–30)

## 2022-06-23 LAB — URINE DRUG SCREEN, QUALITATIVE (ARMC ONLY)
Amphetamines, Ur Screen: NOT DETECTED
Barbiturates, Ur Screen: NOT DETECTED
Benzodiazepine, Ur Scrn: NOT DETECTED
Cannabinoid 50 Ng, Ur ~~LOC~~: NOT DETECTED
Cocaine Metabolite,Ur ~~LOC~~: NOT DETECTED
MDMA (Ecstasy)Ur Screen: NOT DETECTED
Methadone Scn, Ur: NOT DETECTED
Opiate, Ur Screen: NOT DETECTED
Phencyclidine (PCP) Ur S: NOT DETECTED
Tricyclic, Ur Screen: POSITIVE — AB

## 2022-06-23 LAB — CBC
HCT: 44.2 % (ref 39.0–52.0)
Hemoglobin: 14.2 g/dL (ref 13.0–17.0)
MCH: 28.5 pg (ref 26.0–34.0)
MCHC: 32.1 g/dL (ref 30.0–36.0)
MCV: 88.6 fL (ref 80.0–100.0)
Platelets: 317 10*3/uL (ref 150–400)
RBC: 4.99 MIL/uL (ref 4.22–5.81)
RDW: 15.2 % (ref 11.5–15.5)
WBC: 10.2 10*3/uL (ref 4.0–10.5)
nRBC: 0 % (ref 0.0–0.2)

## 2022-06-23 LAB — COMPREHENSIVE METABOLIC PANEL
ALT: 19 U/L (ref 0–44)
AST: 25 U/L (ref 15–41)
Albumin: 4.6 g/dL (ref 3.5–5.0)
Alkaline Phosphatase: 114 U/L (ref 38–126)
Anion gap: 13 (ref 5–15)
BUN: 13 mg/dL (ref 6–20)
CO2: 22 mmol/L (ref 22–32)
Calcium: 9.7 mg/dL (ref 8.9–10.3)
Chloride: 100 mmol/L (ref 98–111)
Creatinine, Ser: 0.87 mg/dL (ref 0.61–1.24)
GFR, Estimated: >60 mL/min (ref >60)
Glucose, Bld: 204 mg/dL — ABNORMAL HIGH (ref 70–99)
Potassium: 4.2 mmol/L (ref 3.5–5.1)
Sodium: 135 mmol/L (ref 135–145)
Total Bilirubin: 0.5 mg/dL (ref 0.3–1.2)
Total Protein: 8.1 g/dL (ref 6.5–8.1)

## 2022-06-23 LAB — SALICYLATE LEVEL: Salicylate Lvl: 15 mg/dL (ref 7.0–30.0)

## 2022-06-23 LAB — ETHANOL: Alcohol, Ethyl (B): <10 mg/dL (ref <10)

## 2022-06-23 MED ORDER — HYDROXYZINE HCL 25 MG PO TABS: 25.0000 mg | ORAL_TABLET | Freq: Three times a day (TID) | ORAL | Status: DC | PRN

## 2022-06-23 MED ORDER — GABAPENTIN 300 MG PO CAPS
300.0000 mg | ORAL_CAPSULE | Freq: Three times a day (TID) | ORAL | Status: DC
  Administered 2022-06-23: 300 mg via ORAL
  Filled 2022-06-23: qty 1

## 2022-06-23 MED ORDER — LIDOCAINE 5 % EX PTCH
1.0000 | MEDICATED_PATCH | Freq: Once | CUTANEOUS | Status: DC
  Administered 2022-06-23: 1 via TRANSDERMAL
  Filled 2022-06-23: qty 1

## 2022-06-23 MED ORDER — HYDROXYZINE HCL 25 MG PO TABS
25.0000 mg | ORAL_TABLET | Freq: Once | ORAL | Status: AC
  Administered 2022-06-23: 25 mg via ORAL
  Filled 2022-06-23: qty 1

## 2022-06-23 MED ORDER — LIDOCAINE 5 % EX PTCH: 1.0000 | MEDICATED_PATCH | CUTANEOUS | 0 refills | Status: AC

## 2022-06-23 MED ORDER — HYDROXYZINE HCL 25 MG PO TABS: 25.0000 mg | ORAL_TABLET | Freq: Three times a day (TID) | ORAL | 0 refills | Status: AC | PRN

## 2022-06-23 NOTE — ED Notes (Signed)
IVC rescinded  PER  Roselyn Reef  NP  informed  Careers adviser

## 2022-06-23 NOTE — ED Notes (Signed)
Snacks given 

## 2022-06-23 NOTE — ED Notes (Signed)
PT  VOL °

## 2022-06-23 NOTE — ED Notes (Signed)
IVC rescinded  informed  Environmental education officer

## 2022-06-23 NOTE — ED Notes (Signed)
Pt refused dinner tray at this time. Pt states he is not hungry.

## 2022-06-23 NOTE — ED Notes (Addendum)
Pt dressed out with this RN, Raquel Sarna NT and officer. Belongings include: Armed forces training and education officer Jeans White shoes Blue watch Orange bracelet [employer] boxers Film/video editor with meds

## 2022-06-23 NOTE — ED Provider Notes (Signed)
[facility] Provider Note    Event Date/Time   First MD Initiated Contact with Patient 06/23/22 1236     (approximate)   History   IVC   HPI  Michael Schmitt is a 52 y.o. male who comes in under IVC patient reporting suicidal ideations.  He reports chronic neck pain.  Did review note where he has been seen by Ortho on 06/21/2022 for cervical spondylolisthesis.  Patient reports having suicidal thoughts secondary to his chronic neck pain.  He reports that his neck pain is similar to prior and that he was followed up with orthopedics but they stated that they would not do surgery on him so he now feels more suicidal given that he realizes that this is going to be still he has to live with.  He denies any new numbness, weakness.  Denies any new falls hitting his head.  He does report that yesterday he was tapping his forehead head but he did not hit it very severely.  Denies any headaches, confusion.  Patient just reports that his nerves are acting up and states that he needs some Ativan to help calm him down.  He denies any urinating or defecating on himself.       Physical Exam   Triage Vital Signs: ED Triage Vitals  Enc Vitals Group     BP 06/23/22 1223 (!) 146/104     Pulse Rate 06/23/22 1223 87     Resp 06/23/22 1223 20     Temp 06/23/22 1223 (!) 97.2 F (36.2 C)     Temp src --      SpO2 06/23/22 1223 97 %     Weight 06/23/22 1226 204 lb (92.5 kg)     Height 06/23/22 1226 6\' 2"  (1.88 m)     Head Circumference --      Peak Flow --      Pain Score 06/23/22 1225 10     Pain Loc --      Pain Edu? --      Excl. in [place]? --     Most recent vital signs: Vitals:   06/23/22 1223  BP: (!) 146/104  Pulse: 87  Resp: 20  Temp: (!) 97.2 F (36.2 C)  SpO2: 97%     General: Awake, no distress.  CV:  Good peripheral perfusion.  Resp:  Normal effort.  Abd:  No distention.  Other:  No signs of trauma to the head.  He reports chronic pain of his  neck.  No weakness noted.  No numbness or tingling   ED Results / Procedures / Treatments   Labs (all labs ordered are listed, but only abnormal results are displayed) Labs Reviewed  CBC  COMPREHENSIVE METABOLIC PANEL  ETHANOL  SALICYLATE LEVEL  ACETAMINOPHEN LEVEL  URINE DRUG SCREEN, QUALITATIVE ([place])    PROCEDURES:  Critical Care performed: No  Procedures   MEDICATIONS ORDERED IN ED: Medications - No data to display   IMPRESSION / MDM / [place] / ED COURSE  I reviewed the triage vital signs and the nursing notes.   Patient's presentation is most consistent with acute presentation with potential threat to life or bodily function.   Patient comes in with concerns for SI chronic neck pain.  Did report hitting his head yesterday but reports it was lately and it has been over 24 hours since this happened and he is at no signs of trauma on examination doubt intracranial hemorrhage.  Pt  is without any acute medical complaints. No exam findings to suggest medical cause of current presentation. Will order psychiatric screening labs and discuss further w/ psychiatric service.  D/d includes but is not limited to psychiatric disease, behavioral/personality disorder, inadequate socioeconomic support, medical.  CBC reassuring CMP shows slightly elevated glucose similar to prior EtOH negative [place] negative, Tylenol slightly elevated we will get a 4-hour although to me he reports that he took [facility] powder.  For his neck pain.  Based on HPI, exam, unremarkable labs, no concern for acute medical problem at this time. No rigidity, clonus, hyperthermia, focal neurologic deficit, diaphoresis, tachycardia, meningismus, ataxia, gait abnormality or other finding to suggest this visit represents a non-psychiatric problem. Screening labs reviewed.    Given this, pt medically cleared, to be dispositioned per Psych.    The patient has been placed in psychiatric observation due  to the need to provide a safe environment for the patient while obtaining psychiatric consultation and evaluation, as well as ongoing medical and medication management to treat the patient's condition.  The patient has been placed under full IVC at this time.    The patient is on the cardiac monitor to evaluate for evidence of arrhythmia and/or significant heart rate changes.      FINAL CLINICAL IMPRESSION(S) / ED DIAGNOSES   Final diagnoses:  Chronic neck pain  Suicide ideation     Rx / DC Orders   ED Discharge Orders     None        Note:  This document was prepared using Dragon voice recognition software and may include unintentional dictation errors.   Vanessa Mead, MD 06/23/22 [phone]

## 2022-06-23 NOTE — BH Assessment (Signed)
Comprehensive Clinical Assessment (CCA) Screening, Triage and Referral Note  06/23/2022 Rodolph Patillo RO:[id number]  Chief Complaint:  Chief Complaint  Patient presents with   IVC   Visit Diagnosis: Stress reaction disorder  Swanson Kostoff is 52 year old male who presents to the ER because he was having SI due to neck pain. He states, he will be doing better once the pains improve.  During the interview the patient was calm, cooperative and pleasant. He was able to provide appropriate answers to the questions. During the interview, he denied SI/HI and AV/H.  Patient Reported Information How did you hear about [place]? -- (EMS)  What Is the Reason for Your Visit/Call Today? Patient voicing SI due to neck pain.  How Long Has This Been Causing You Problems? 1 wk - 1 month  What Do You Feel Would Help You the Most Today? Treatment for Depression or other mood problem   Have You Recently Had Any Thoughts About Hurting Yourself? Yes  Are You Planning to Commit Suicide/Harm Yourself At This time? No   Have you Recently Had Thoughts About [place]? No  Are You Planning to Harm Someone at This Time? No  Explanation: n/a   Have You Used Any Alcohol or Drugs in the Past 24 Hours? No  How Long Ago Did You Use Drugs or Alcohol? No data recorded What Did You Use and How Much? n/a   Do You Currently Have a Therapist/Psychiatrist? Yes  Name of Therapist/Psychiatrist: [facility]   Have You Been Recently Discharged From Any Office Practice or Programs? No  Explanation of Discharge From Practice/Program: Discharged from [facility] in March 2023    CCA Screening Triage Referral Assessment Type of Contact: Face-to-Face  Telemedicine Service Delivery:   Is this Initial or Reassessment?   Date Telepsych consult ordered in [facility]:    Time Telepsych consult ordered in [facility]:    Location of Assessment: [facility] ED  Provider Location: [facility] ED    Collateral  Involvement: None   Does Patient Have a [place]? No data recorded Name and Contact of Legal Guardian: No data recorded If Minor and Not Living with Parent(s), Who has Custody? NA  Is CPS involved or ever been involved? Never  Is APS involved or ever been involved? Never   Patient Determined To Be At Risk for Harm To Self or Others Based on Review of Patient Reported Information or Presenting Complaint? No  Method: No Plan  Availability of Means: No access or NA  Intent: Clearly intends on inflicting harm that could cause death  Notification Required: No need or identified person  Additional Information for Danger to Others Potential: Previous attempts  Additional Comments for Danger to Others Potential: No data recorded Are There Guns or Other Weapons in Your Home? No  Types of Guns/Weapons: No data recorded Are These Weapons Safely Secured?                            No  Who Could Verify You Are Able To Have These Secured: No data recorded Do You Have any Outstanding Charges, Pending Court Dates, Parole/Probation? No data recorded Contacted To Inform of Risk of Harm To Self or Others: Other: Comment   Does Patient Present under Involuntary Commitment? Yes   [place] of Residence:    Patient Currently Receiving the Following Services: Medication Management   Determination of Need: Emergent (2 hours)   Options  For Referral: ED Visit   Discharge Disposition:    Gunnar Fusi MS, LCAS, [facility], [facility] Therapeutic Triage Specialist 06/23/2022 3:18 PM

## 2022-06-23 NOTE — ED Notes (Signed)
Pt given belonging back. Pt changing into street clothes at this time in room.
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
## 2022-06-23 NOTE — ED Notes (Signed)
Pt states he's going to call ride from ED lobby, pt escorted to lobby at this time.

## 2022-06-23 NOTE — ED Notes (Signed)
IVC rescinded at this time per secretary, pt status voluntary

## 2022-06-23 NOTE — ED Notes (Signed)
Pt refusing blood work, states he wants to leave. EDP notified, Awaiting D/C paperwork.
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
## 2022-07-15 ENCOUNTER — Other Ambulatory Visit (HOSPITAL_COMMUNITY): Payer: Self-pay | Admitting: Psychiatry

## 2022-08-03 ENCOUNTER — Emergency Department: Admission: EM | Admit: 2022-08-03 | Discharge: 2022-08-03 | Discharge status: Against Medical Advice | Payer: 59

## 2022-08-03 NOTE — ED Notes (Signed)
No answer when called several times from lobby 

## 2022-08-26 NOTE — Progress Notes (Signed)
Pt came in under IVC- so needed beh med eval.

## 2022-09-11 ENCOUNTER — Encounter: Payer: Self-pay | Admitting: *Deleted

## 2022-09-12 ENCOUNTER — Encounter: Admission: RE | Payer: Self-pay | Source: Home / Self Care

## 2022-09-12 ENCOUNTER — Encounter: Payer: Self-pay | Admitting: Anesthesiology

## 2022-09-12 ENCOUNTER — Ambulatory Visit: Admission: RE | Admit: 2022-09-12 | Payer: 59 | Source: Home / Self Care

## 2022-09-12 SURGERY — COLONOSCOPY WITH PROPOFOL
Anesthesia: General

## 2022-10-07 ENCOUNTER — Encounter: Payer: Self-pay | Admitting: Emergency Medicine

## 2022-10-07 ENCOUNTER — Other Ambulatory Visit: Payer: Self-pay

## 2022-10-07 DIAGNOSIS — Z79899 Other long term (current) drug therapy: Secondary | ICD-10-CM | POA: Insufficient documentation

## 2022-10-07 DIAGNOSIS — E119 Type 2 diabetes mellitus without complications: Secondary | ICD-10-CM | POA: Diagnosis not present

## 2022-10-07 DIAGNOSIS — F3132 Bipolar disorder, current episode depressed, moderate: Secondary | ICD-10-CM | POA: Diagnosis not present

## 2022-10-07 DIAGNOSIS — F609 Personality disorder, unspecified: Secondary | ICD-10-CM | POA: Insufficient documentation

## 2022-10-07 DIAGNOSIS — F1310 Sedative, hypnotic or anxiolytic abuse, uncomplicated: Secondary | ICD-10-CM | POA: Insufficient documentation

## 2022-10-07 DIAGNOSIS — Z7289 Other problems related to lifestyle: Secondary | ICD-10-CM | POA: Insufficient documentation

## 2022-10-07 DIAGNOSIS — R45851 Suicidal ideations: Secondary | ICD-10-CM | POA: Insufficient documentation

## 2022-10-07 DIAGNOSIS — I2510 Atherosclerotic heart disease of native coronary artery without angina pectoris: Secondary | ICD-10-CM | POA: Diagnosis not present

## 2022-10-07 DIAGNOSIS — I129 Hypertensive chronic kidney disease with stage 1 through stage 4 chronic kidney disease, or unspecified chronic kidney disease: Secondary | ICD-10-CM | POA: Diagnosis not present

## 2022-10-07 DIAGNOSIS — R456 Violent behavior: Secondary | ICD-10-CM | POA: Diagnosis present

## 2022-10-07 DIAGNOSIS — N189 Chronic kidney disease, unspecified: Secondary | ICD-10-CM | POA: Insufficient documentation

## 2022-10-07 DIAGNOSIS — Z955 Presence of coronary angioplasty implant and graft: Secondary | ICD-10-CM | POA: Insufficient documentation

## 2022-10-07 DIAGNOSIS — Z7984 Long term (current) use of oral hypoglycemic drugs: Secondary | ICD-10-CM | POA: Insufficient documentation

## 2022-10-07 DIAGNOSIS — F4325 Adjustment disorder with mixed disturbance of emotions and conduct: Secondary | ICD-10-CM | POA: Diagnosis not present

## 2022-10-07 NOTE — ED Notes (Addendum)
Patient changed into blue hospital scrubs.  1 pair of blue shorts, 1 dark color t shirt, one pair of boxers, 1 pair of tennis shoes, 1 watch, 1 pack of cigarettes, 1 wallet, no money  Patient states "I want something to help me calm down so I won't start yelling again."

## 2022-10-07 NOTE — ED Triage Notes (Signed)
Patient walked into triage calm and cooperative and asked "when am I going to get a bed?"  Patient educated on the triage process and that we would get him a bed as soon as possible.  Patient then stated that he was SI "for a while" and then stood up and started screaming "I NEED HELP."  Patient started pacing with fists balled, security in triage with patient.  Patient also c/o klonopin withdrawal because he is out.

## 2022-10-08 ENCOUNTER — Emergency Department
Admission: EM | Admit: 2022-10-08 | Discharge: 2022-10-08 | Disposition: A | Discharge status: Home / Self Care | Payer: 59 | Attending: Emergency Medicine | Admitting: Emergency Medicine

## 2022-10-08 DIAGNOSIS — F3132 Bipolar disorder, current episode depressed, moderate: Secondary | ICD-10-CM | POA: Diagnosis not present

## 2022-10-08 DIAGNOSIS — F430 Acute stress reaction: Secondary | ICD-10-CM | POA: Diagnosis present

## 2022-10-08 DIAGNOSIS — R45851 Suicidal ideations: Secondary | ICD-10-CM

## 2022-10-08 DIAGNOSIS — Z79899 Other long term (current) drug therapy: Secondary | ICD-10-CM

## 2022-10-08 DIAGNOSIS — F1310 Sedative, hypnotic or anxiolytic abuse, uncomplicated: Secondary | ICD-10-CM | POA: Diagnosis not present

## 2022-10-08 DIAGNOSIS — Z765 Malingerer [conscious simulation]: Secondary | ICD-10-CM

## 2022-10-08 DIAGNOSIS — F609 Personality disorder, unspecified: Secondary | ICD-10-CM | POA: Diagnosis present

## 2022-10-08 LAB — CBC
HCT: 43.2 % (ref 39.0–52.0)
Hemoglobin: 14.2 g/dL (ref 13.0–17.0)
MCH: 28.8 pg (ref 26.0–34.0)
MCHC: 32.9 g/dL (ref 30.0–36.0)
MCV: 87.6 fL (ref 80.0–100.0)
Platelets: 248 10*3/uL (ref 150–400)
RBC: 4.93 MIL/uL (ref 4.22–5.81)
RDW: 14.9 % (ref 11.5–15.5)
WBC: 8.5 10*3/uL (ref 4.0–10.5)
nRBC: 0 % (ref 0.0–0.2)

## 2022-10-08 LAB — COMPREHENSIVE METABOLIC PANEL
ALT: 18 U/L (ref 0–44)
AST: 18 U/L (ref 15–41)
Albumin: 4.5 g/dL (ref 3.5–5.0)
Alkaline Phosphatase: 94 U/L (ref 38–126)
Anion gap: 8 (ref 5–15)
BUN: 15 mg/dL (ref 6–20)
CO2: 23 mmol/L (ref 22–32)
Calcium: 9.2 mg/dL (ref 8.9–10.3)
Chloride: 107 mmol/L (ref 98–111)
Creatinine, Ser: 0.83 mg/dL (ref 0.61–1.24)
GFR, Estimated: >60 mL/min (ref >60)
Glucose, Bld: 132 mg/dL — ABNORMAL HIGH (ref 70–99)
Potassium: 3.7 mmol/L (ref 3.5–5.1)
Sodium: 138 mmol/L (ref 135–145)
Total Bilirubin: 0.6 mg/dL (ref 0.3–1.2)
Total Protein: 7.3 g/dL (ref 6.5–8.1)

## 2022-10-08 LAB — ETHANOL: Alcohol, Ethyl (B): <10 mg/dL (ref <10)

## 2022-10-08 LAB — ACETAMINOPHEN LEVEL: Acetaminophen (Tylenol), Serum: <10 ug/mL — ABNORMAL LOW (ref 10–30)

## 2022-10-08 LAB — SALICYLATE LEVEL: Salicylate Lvl: <7 mg/dL — ABNORMAL LOW (ref 7.0–30.0)

## 2022-10-08 MED ORDER — LORAZEPAM 2 MG PO TABS
2.0000 mg | ORAL_TABLET | Freq: Once | ORAL | Status: AC
  Administered 2022-10-08: 2 mg via ORAL
  Filled 2022-10-08: qty 1

## 2022-10-08 NOTE — ED Provider Notes (Signed)
[facility] Provider Note    Event Date/Time   First MD Initiated Contact with Patient 10/08/22 0014     (approximate)   History   Suicidal   HPI  Michael Schmitt is a 52 y.o. male brought to the ED by [facility] police voluntarily for behavioral medicine evaluation.  Patient with a history of bipolar disorder who endorses suicidal ideation without plan.  Denies HI/AH/VH.  Feels restless and shaky because he ran out of his Klonopin.  Denies headache, vision changes, chest pain, shortness of breath, abdominal pain, nausea, vomiting or dizziness.     Past Medical History   Past Medical History:  Diagnosis Date   Anxiety    Anxiety    Arthritis    cerv. stenosis, spondylosis, HNP- lower back , has been followed in pain clinic, has  had injection s in cerv. 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   Mental disorder    MI, old    RLS (restless legs syndrome)    detected on sleep study   Shortness of breath    Ventricular fibrillation (HCC) 04/05/2016     Active Problem List   Patient Active Problem List   Diagnosis Date Noted   Stress reaction causing mixed disturbance of emotion and conduct 06/23/2022   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   Drug-seeking behavior 02/25/2021   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   Alcohol abuse with alcohol-induced mood disorder (HCC) 09/21/2018   Personality disorder (HCC) 12/31/2017   Generalized anxiety  disorder 02/05/2017   Long term prescription benzodiazepine use 11/21/2016   Opiate use 11/21/2016   Type 2 diabetes mellitus without complication, without long-term current use of insulin (HCC) 10/19/2016   Chronic Suicidal ideation 09/15/2016   Alcohol use disorder, severe, 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HCC) 06/26/2016   Benzodiazepine abuse (HCC) 06/11/2016     Past Surgical History   Past Surgical History:  Procedure Laterality Date   ANTERIOR CERVICAL DECOMP/DISCECTOMY FUSION  11/13/2011   Procedure: ANTERIOR CERVICAL DECOMPRESSION/DISCECTOMY FUSION 1 LEVEL;  Surgeon: Mariam Dollar, MD;  Location: [facility] NEURO ORS;  Service: Neurosurgery;  Laterality: N/A;  Anterior Cervical Decompression/discectomy Fusion. Cervical three-four.   CARDIAC CATHETERIZATION N/A 01/01/2015   Procedure: Left Heart Cath and Coronary Angiography;  Surgeon: Rinaldo Cloud, MD;  Location: [facility] INVASIVE CV LAB;  Service: Cardiovascular;  Laterality: N/A;   CARDIAC CATHETERIZATION N/A 04/05/2016   Procedure: Left Heart Cath and Coronary Angiography;  Surgeon: Lyn Records, MD;  Location: [facility] INVASIVE CV LAB;  Service: Cardiovascular;  Laterality: N/A;   CARDIAC CATHETERIZATION N/A 04/05/2016   Procedure: Coronary Stent Intervention;  Surgeon: Lyn Records, MD;  Location: [facility] INVASIVE CV LAB;  Service: Cardiovascular;  Laterality: N/A;   CARDIAC CATHETERIZATION N/A 04/05/2016   Procedure: Intravascular Ultrasound/IVUS;  Surgeon: Lyn Records, MD;  Location: [facility] INVASIVE CV LAB;  Service: Cardiovascular;  Laterality: N/A;   NASAL  SINUS SURGERY     2005     Home Medications   Prior to Admission medications   Medication Sig Start Date End Date Taking? Authorizing Provider  acamprosate (CAMPRAL) 333 MG tablet Take 666 mg by mouth 3 (three) times daily with meals.    [provider]  amLODipine (NORVASC) 5 MG tablet Take 2 tablets (10 mg total) by mouth daily. 06/10/22   Fisher, Roselyn Bering, PA-C  atorvastatin (LIPITOR) 20 MG  tablet Take 20 mg by mouth daily. 12/16/19   [provider]  buPROPion (WELLBUTRIN SR) 150 MG 12 hr tablet Take 150 mg by mouth daily. Patient not taking: Reported on 02/24/2022 02/09/22   [provider]  carvedilol (COREG) 6.25 MG tablet Take 6.25 mg by mouth 2 (two) times daily with a meal. 10/25/18   [provider]  chlorproMAZINE (THORAZINE) 200 MG tablet Take 1 tablet by mouth in the morning, at noon, and at bedtime. 10/10/21   [provider]  clopidogrel (PLAVIX) 75 MG tablet Take 75 mg by mouth daily. 12/16/19   [provider]  cyclobenzaprine (FLEXERIL) 10 MG tablet Take 10 mg by mouth 3 (three) times daily as needed for muscle spasms. 01/05/22   [provider]  divalproex (DEPAKOTE) 500 MG DR tablet Take 1,500 mg by mouth at bedtime. 10/10/21   [provider]  doxepin (SINEQUAN) 25 MG capsule Take 25 mg by mouth.    [provider]  DULoxetine (CYMBALTA) 60 MG capsule Take 60 mg by mouth at bedtime. 01/18/22   [provider]  escitalopram (LEXAPRO) 10 MG tablet Take 1 tablet (10 mg total) by mouth daily. 07/23/21 10/21/21  Minna Antis, MD  gabapentin (NEURONTIN) 300 MG capsule Take 300 mg by mouth 3 (three) times daily. 10/06/21   [provider]  glipiZIDE (GLUCOTROL XL) 10 MG 24 hr tablet Take 10 mg by mouth daily with breakfast.    [provider]  haloperidol (HALDOL) 5 MG tablet Take 5 mg by mouth 2 (two) times daily. 09/01/21   [provider]  LORazepam (ATIVAN) 1 MG tablet Take 1 tablet (1 mg total) by mouth 2 (two) times daily. 02/11/22 02/11/23  Arnaldo Natal, MD  metFORMIN (GLUCOPHAGE) 500 MG tablet Take 1,000 mg by mouth 2 (two) times daily. 10/02/18   [provider]  methocarbamol (ROBAXIN) 500 MG tablet Take 500 mg by mouth every 6 (six) hours as needed for muscle spasms. 01/24/22   [provider]  methylPREDNISolone (MEDROL DOSEPAK) 4 MG TBPK  tablet Take by mouth as directed. Patient not taking: Reported on 02/24/2022 01/26/22   [provider]  mirtazapine (REMERON) 15 MG tablet Take 15 mg by mouth at bedtime. 10/06/21   [provider]  oxyCODONE-acetaminophen (PERCOCET) 7.5-325 MG tablet Take 1 tablet by mouth every 8 (eight) hours as needed for severe pain. 04/18/22 04/18/23  Triplett, Rulon Eisenmenger B, FNP  pantoprazole (PROTONIX) 40 MG tablet Take 1 tablet by mouth daily. 08/18/20   [provider]  propranolol (INDERAL) 10 MG tablet Take 1 tablet (10 mg total) by mouth 2 (two) times daily. 07/23/21   Minna Antis, MD  QUEtiapine (SEROQUEL) 400 MG tablet Take 2 tablets (800 mg total) by mouth at bedtime. 07/23/21   Minna Antis, MD     Allergies  Prednisone, Amoxicillin, Hydrocodone, Varenicline, Wellbutrin [bupropion], Other, and Trazodone and nefazodone   Family History   Family History  Problem Relation Age of Onset   Prostate cancer Father  Hypertension Mother    Kidney Stones Mother    Anxiety disorder Mother    Depression Mother    COPD Sister    Hypertension Sister    Diabetes Sister    Depression Sister    Anxiety disorder Sister    Seizures Sister    ADD / ADHD Son    ADD / ADHD Daughter      Physical Exam  Triage Vital Signs: ED Triage Vitals  Encounter Vitals Group     BP 10/07/22 2349 139/87     Systolic BP Percentile --      Diastolic BP Percentile --      Pulse Rate 10/07/22 2349 95     Resp 10/07/22 2349 18     Temp 10/07/22 2349 (!) 97.5 F (36.4 C)     Temp Source 10/07/22 2349 Oral     SpO2 10/07/22 2349 96 %     Weight 10/07/22 2350 200 lb (90.7 kg)     Height 10/07/22 2350 6\' 2"  (1.88 m)     Head Circumference --      Peak Flow --      Pain Score 10/07/22 2350 10     Pain Loc --      Pain Education --      Exclude from Growth Chart --     Updated Vital Signs: BP 139/87 (BP Location: Left Arm)   Pulse 95   Temp (!) 97.5 F (36.4 C) (Oral)   Resp  18   Ht 6\' 2"  (1.88 m)   Wt 90.7 kg   SpO2 96%   BMI 25.68 kg/m    General: Awake, no distress.  CV:  RRR.  Good peripheral perfusion.  Resp:  Normal effort.  CTAB. Abd:  Nontender.  No distention.  Other:  Restless.   ED Results / Procedures / Treatments  Labs (all labs ordered are listed, but only abnormal results are displayed) Labs Reviewed  CBC  COMPREHENSIVE METABOLIC PANEL  ETHANOL  SALICYLATE LEVEL  ACETAMINOPHEN LEVEL  URINE DRUG SCREEN, QUALITATIVE ([facility] ONLY)     EKG  None   RADIOLOGY None   Official radiology report(s): No results found.   PROCEDURES:  Critical Care performed: No  Procedures   MEDICATIONS ORDERED IN ED: Medications  LORazepam (ATIVAN) tablet 2 mg (has no administration in time range)     IMPRESSION / MDM / ASSESSMENT AND PLAN / ED COURSE  I reviewed the triage vital signs and the nursing notes.                             52 year old male presenting with depression with suicidal ideation.  Contracts for safety while in the emergency department.  Will administer oral Ativan for withdrawal symptoms.  Patient's presentation is most consistent with exacerbation of chronic illness.  The patient has been placed in psychiatric observation due to the need to provide a safe environment for the patient while obtaining psychiatric consultation and evaluation, as well as ongoing medical and medication management to treat the patient's condition.  The patient has not been placed under full IVC at this time.   Clinical Course as of 10/08/22 0340  Wynelle Link Oct 08, 2022  0131 Patient seen and cleared by overnight psychiatric team who deems him psychiatrically stable for discharge home with outpatient follow-up.  Strict return precautions given.  Patient verbalizes understanding and agrees with plan of care. [JS]    Clinical Course  User Index [JS] Irean Hong, MD     FINAL CLINICAL IMPRESSION(S) / ED DIAGNOSES   Final diagnoses:   Bipolar affective disorder, currently depressed, moderate (HCC)     Rx / DC Orders   ED Discharge Orders     None        Note:  This document was prepared using Dragon voice recognition software and may include unintentional dictation errors.   Irean Hong, MD 10/08/22 [phone]

## 2022-10-08 NOTE — ED Notes (Signed)
Patient reports suicidal for approximately the past month, but no plan.  Also reports he is withdrawing from klonopin because he ran out.

## 2022-10-08 NOTE — Consult Note (Signed)
[facility] Face-to-Face Psychiatry Consult   Reason for Consult:  psych evaluation Referring Physician:  Dr. Dolores Frame Patient Identification: Michael Schmitt MRN:  [id number] Principal Diagnosis: <principal problem not specified> Diagnosis:  Active Problems:   Chronic Suicidal ideation   Benzodiazepine abuse (HCC)   Long term prescription benzodiazepine use   Personality disorder (HCC)   Drug-seeking behavior   Stress reaction causing mixed disturbance of emotion and conduct   Total Time spent with patient: 45 minutes  Subjective:     HPI:  Psych Assessment  Michael Schmitt, 52 y.o., male patient seen by TTS and this provider; chart reviewed and consulted with Dr. Dolores Frame on 10/08/22.  On evaluation Michael Schmitt reports that he is out of klonopin and is therefore feeling suicidal.  Per chart review, patient has been see in this er 6x in the past 6 months.  And has been seen at [facility] ER as recent as 6/10.  Per chart review, on 7/10 [facility] last outpatient appointment, notes states, "The patient reports experiencing depression and anxiety. I saw him for this problem about a week ago. At the time the patient wanted refills of clonazepam, Lunesta, because of the nature of the controlled substance and the patient having a previous history of polypharmacy and substance abuse I asked him to be seen by his psychiatrist.. He consulted his psychiatrist who referred him to [facility], but was never seen. He notes [name] did not answer the door when he arrived, nor did they answer any of his calls. He denies current suicidal ideation but expresses a need for additional medication. He sought medical attention at [facility] last Friday 09/29/2022, where he was prescribed Ativan for a total of 30 tablets. However, he expressed the Ativan was not working and he gave the medication to his sister. The patient is requesting a prescription for Klonopin. His Klonopin prescription was previously  prescribed by Dr. Jonathon Bellows."  [name] has been weaning patient off of Klonpin.    Per chart review [facility] states pt has been endorsing  chronic SI, "for years." Denies plan or intent or recent unsafe behavior. Pt has demonstrated pattern of seeking help when in crisis rather than acting on SI. Pt has long history of low lethality suicidal gestures w high probability of rescue. Pt is at chronically elevated risk of self harm, current risk is not significantly increased from baseline. No signs or symptoms of mania or psychosis.  Though the patient presented to the ED for SI, based on chart review, it appears likely that the behaviors being displayed by patient are active when patient is at/near his clinical baseline. Given the chronicity of SI, acute inpatient psychiatric hospitalization would be unlikely to decrease risk of harm to self. Since risk is chronic, pt does not meet criteria for IVC. Outpatient mental health follow up would be the best way to mitigate risk. As such, it is recommended that pt have close follow up w [name], or [facility].    Although this patient presented to the ED for SI, they do not appear to be at imminent risk of dangerousness to self and dangerousness to others at this time. While future psychiatric events cannot be accurately predicted, the patient does not necessitate nor desire further acute inpatient psychiatric care and it is not felt that inpatient hospitalization would be therapeutic or beneficial for the patient at this time. The patient and patient's support system weighed the risks and benefits of an inpatient admission and voiced understanding of the  risks involved in returning to the community. The patient and patient's support system responded well to supportive and problem solving therapeutic interventions.   While this patient presents with risk factors that include current diagnosis of depression, suicidal ideation or threats without a plan, chronic mental illness  > 5 years, chronic mood lability , chronic impulsivity, and rigid thinking male gender and agitation, these are mitigated by protective factors which include lack of active SI/HI, no know access to weapons or firearms, motivation for treatment, supportive family, presence of an available support system, safe housing, support system in agreement with treatment recommendations, and presence of a safety plan with follow-up care.  A thorough psychiatric evaluation has been completed including evaluation of the patient, collecting collateral history reviewing available medical/clinic records, evaluating the patient's unique risk and protective factors, and discussing treatment recommendations.   Diagnoses:  Active Problems: Depression, unspecified Drug-seeking behavior Moderate benzodiazepine use disorder, dependence (CMS-HCC) Personality disorder (CMS-HCC)  Stressors: frequent ED utilization   Plan  -- Disposition:  The Psychiatry Emergency Service treatment team recommends discharge to home/self-care. A phone number for the on-call mental health provider at [facility] was provided, in the event that urgent psychiatric concerns arise in the future. The Psychiatry Emergency Service consultant recommends returning to the nearest ED or calling 911 if the patient develops any intent or plan of harming self or others.     Disposition:  Patient is psychiatrically cleared  Spoke with Dr. Dolores Frame informed of above recommendation and disposition  Past Psychiatric History:    Chronic Suicidal ideation   Benzodiazepine abuse (HCC)   Long term prescription benzodiazepine use   Personality disorder (HCC)   Drug-seeking behavior   Stress reaction causing mixed disturbance of emotion and conduct  Risk to Self:   Risk to Others:   Prior Inpatient Therapy:   Prior Outpatient Therapy:    Past Medical History:  Past Medical History:  Diagnosis Date   Anxiety    Anxiety    Arthritis    cerv. stenosis,  spondylosis, HNP- lower back , has been followed in pain clinic, has  had injection s in cerv. 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 Cervical three-four.   CARDIAC CATHETERIZATION N/A 01/01/2015   Procedure: Left Heart Cath and Coronary Angiography;  Surgeon: Rinaldo Cloud, MD;  Location: [facility] INVASIVE CV LAB;  Service: Cardiovascular;  Laterality: N/A;   CARDIAC CATHETERIZATION N/A 04/05/2016  Procedure: Left Heart Cath and Coronary Angiography;  Surgeon: Lyn Records, MD;  Location: [facility] INVASIVE CV LAB;  Service: Cardiovascular;  Laterality: N/A;   CARDIAC CATHETERIZATION N/A 04/05/2016   Procedure: Coronary Stent Intervention;  Surgeon: Lyn Records, MD;  Location: [facility] INVASIVE CV LAB;  Service: Cardiovascular;  Laterality: N/A;   CARDIAC CATHETERIZATION N/A 04/05/2016   Procedure: Intravascular Ultrasound/IVUS;  Surgeon: Lyn Records, MD;  Location: [facility] INVASIVE CV LAB;  Service: Cardiovascular;  Laterality: N/A;    NASAL SINUS SURGERY     2005   Family History:  Family History  Problem Relation Age of Onset   Prostate cancer Father    Hypertension Mother    Kidney Stones Mother    Anxiety disorder Mother    Depression Mother    COPD Sister    Hypertension Sister    Diabetes Sister    Depression Sister    Anxiety disorder Sister    Seizures Sister    ADD / ADHD Son    ADD / ADHD Daughter    Family Psychiatric  History: unknwn Social History:  Social History   Substance and Sexual Activity  Alcohol Use No   Alcohol/week: 0.0 standard drinks of alcohol   Comment: last drinked in 3 months.      Social History   Substance and Sexual Activity  Drug Use No    Social History   Socioeconomic History   Marital status: Widowed    Spouse name: Not on file   Number of children: 3   Years of education: 16   Highest education level: Not on file  Occupational History    Comment: unemployed  Tobacco Use   Smoking status: Every Day    Current packs/day: 2.00    Average packs/day: 2.0 packs/day for 17.0 years (34.0 ttl pk-yrs)    Types: Cigarettes, Cigars   Smokeless tobacco: Former   Tobacco comments:    occassional snuff   Vaping Use   Vaping status: Never Used  Substance and Sexual Activity   Alcohol use: No    Alcohol/week: 0.0 standard drinks of alcohol    Comment: last drinked in 3 months.    Drug use: No   Sexual activity: Not Currently  Other Topics Concern   Not on file  Social History Narrative   Patient is right handed.   Patient drinks 2 sodas daily.   Social Determinants of Health   Financial Resource Strain: Medium Risk (08/22/2022)   Received from [facility], [facility]   Overall Financial Resource Strain (CARDIA)    Difficulty of Paying Living Expenses: Somewhat hard  Food Insecurity: No Food Insecurity (08/22/2022)   Received from [facility], [facility]   Hunger Vital Sign    Worried About Running Out of Food in the Last Year: Never true     Ran Out of Food in the Last Year: Never true  Transportation Needs: No Transportation Needs (08/22/2022)   Received from [facility], [facility]   [facility] - Transportation    Lack of Transportation (Medical): No    Lack of Transportation (Non-Medical): No  Physical Activity: Not on file  Stress: Not on file  Social Connections: Not on file   Additional Social History:    Allergies:   Allergies  Allergen Reactions   Prednisone Other (See Comments)    Hypertension and makes him feel "spacey"   Amoxicillin Other (See Comments)    Other Reaction: ELEVATED BP  Hydrocodone Other (See Comments)    Causes headaches and makes the patient irritable   Varenicline Other (See Comments)    Suicidal thoughts   Wellbutrin [Bupropion]     Seizures    Other Anxiety    Other reaction(s): Other (See Comments) Patient stated that this keeps him awake; "has an opposite effect on me"   Trazodone And Nefazodone Anxiety and Other (See Comments)    Patient stated that this keeps him awake; "has an opposite effect on me"    Labs:  Results for orders placed or performed during the hospital encounter of 10/08/22 (from the past 48 hour(s))  Comprehensive metabolic panel     Status: Abnormal   Collection Time: 10/07/22 11:53 PM  Result Value Ref Range   Sodium 138 135 - 145 mmol/L   Potassium 3.7 3.5 - 5.1 mmol/L   Chloride 107 98 - 111 mmol/L   CO2 23 22 - 32 mmol/L   Glucose, Bld 132 (H) 70 - 99 mg/dL    Comment: Glucose reference range applies only to samples taken after fasting for at least 8 hours.   BUN 15 6 - 20 mg/dL   Creatinine, Ser 1.61 0.61 - 1.24 mg/dL   Calcium 9.2 8.9 - 09.6 mg/dL   Total Protein 7.3 6.5 - 8.1 g/dL   Albumin 4.5 3.5 - 5.0 g/dL   AST 18 15 - 41 U/L   ALT 18 0 - 44 U/L   Alkaline Phosphatase 94 38 - 126 U/L   Total Bilirubin 0.6 0.3 - 1.2 mg/dL   GFR, Estimated >04 >54 mL/min    Comment: (NOTE) Calculated using the CKD-EPI Creatinine Equation  (2021)    Anion gap 8 5 - 15    Comment: Performed at [facility], [address] [place], [place] [address]  Ethanol     Status: None   Collection Time: 10/07/22 11:53 PM  Result Value Ref Range   Alcohol, Ethyl (B) <10 <10 mg/dL    Comment: (NOTE) Lowest detectable limit for serum alcohol is 10 mg/dL.  For medical purposes only. Performed at [facility], [address] [place], [place] [address]   Salicylate level     Status: Abnormal   Collection Time: 10/07/22 11:53 PM  Result Value Ref Range   Salicylate Lvl <7.0 (L) 7.0 - 30.0 mg/dL    Comment: Performed at [facility], [address] [place], [place] [address]  Acetaminophen level     Status: Abnormal   Collection Time: 10/07/22 11:53 PM  Result Value Ref Range   Acetaminophen (Tylenol), Serum <10 (L) 10 - 30 ug/mL    Comment: (NOTE) Therapeutic concentrations vary significantly. A range of 10-30 ug/mL  may be an effective concentration for many patients. However, some  are best treated at concentrations outside of this range. Acetaminophen concentrations >150 ug/mL at 4 hours after ingestion  and >50 ug/mL at 12 hours after ingestion are often associated with  toxic reactions.  Performed at [facility], [address] [place], [place] [address]   cbc     Status: None   Collection Time: 10/07/22 11:53 PM  Result Value Ref Range   WBC 8.5 4.0 - 10.5 K/uL   RBC 4.93 4.22 - 5.81 MIL/uL   Hemoglobin 14.2 13.0 - 17.0 g/dL   HCT 57.8 46.9 - 62.9 %   MCV 87.6 80.0 - 100.0 fL   MCH 28.8 26.0 - 34.0 pg   MCHC 32.9 30.0 - 36.0  g/dL   RDW 41.6 60.6 - 30.1 %   Platelets 248 150 - 400 K/uL   nRBC 0.0 0.0 - 0.2 %    Comment: Performed at [facility], [address] [place], [place] [address]    No current facility-administered medications for this encounter.   Current Outpatient Medications  Medication Sig Dispense Refill   acamprosate (CAMPRAL) 333 MG tablet  Take 666 mg by mouth 3 (three) times daily with meals.     amLODipine (NORVASC) 5 MG tablet Take 2 tablets (10 mg total) by mouth daily. 180 tablet 0   atorvastatin (LIPITOR) 20 MG tablet Take 20 mg by mouth daily.     buPROPion (WELLBUTRIN SR) 150 MG 12 hr tablet Take 150 mg by mouth daily. (Patient not taking: Reported on 02/24/2022)     carvedilol (COREG) 6.25 MG tablet Take 6.25 mg by mouth 2 (two) times daily with a meal.     chlorproMAZINE (THORAZINE) 200 MG tablet Take 1 tablet by mouth in the morning, at noon, and at bedtime.     clopidogrel (PLAVIX) 75 MG tablet Take 75 mg by mouth daily.     cyclobenzaprine (FLEXERIL) 10 MG tablet Take 10 mg by mouth 3 (three) times daily as needed for muscle spasms.     divalproex (DEPAKOTE) 500 MG DR tablet Take 1,500 mg by mouth at bedtime.     doxepin (SINEQUAN) 25 MG capsule Take 25 mg by mouth.     DULoxetine (CYMBALTA) 60 MG capsule Take 60 mg by mouth at bedtime.     escitalopram (LEXAPRO) 10 MG tablet Take 1 tablet (10 mg total) by mouth daily. 30 tablet 2   gabapentin (NEURONTIN) 300 MG capsule Take 300 mg by mouth 3 (three) times daily.     glipiZIDE (GLUCOTROL XL) 10 MG 24 hr tablet Take 10 mg by mouth daily with breakfast.     haloperidol (HALDOL) 5 MG tablet Take 5 mg by mouth 2 (two) times daily.     LORazepam (ATIVAN) 1 MG tablet Take 1 tablet (1 mg total) by mouth 2 (two) times daily. 2 tablet 0   metFORMIN (GLUCOPHAGE) 500 MG tablet Take 1,000 mg by mouth 2 (two) times daily.     methocarbamol (ROBAXIN) 500 MG tablet Take 500 mg by mouth every 6 (six) hours as needed for muscle spasms.     methylPREDNISolone (MEDROL DOSEPAK) 4 MG TBPK tablet Take by mouth as directed. (Patient not taking: Reported on 02/24/2022)     mirtazapine (REMERON) 15 MG tablet Take 15 mg by mouth at bedtime.     oxyCODONE-acetaminophen (PERCOCET) 7.5-325 MG tablet Take 1 tablet by mouth every 8 (eight) hours as needed for severe pain. 12 tablet 0   pantoprazole  (PROTONIX) 40 MG tablet Take 1 tablet by mouth daily.     propranolol (INDERAL) 10 MG tablet Take 1 tablet (10 mg total) by mouth 2 (two) times daily. 60 tablet 2   QUEtiapine (SEROQUEL) 400 MG tablet Take 2 tablets (800 mg total) by mouth at bedtime. 30 tablet 2    Musculoskeletal: Strength & Muscle Tone: within normal limits Gait & Station: normal Patient leans: N/A Psychiatric Specialty Exam:  Presentation  General Appearance:  Appropriate for Environment  Eye Contact: Fair  Speech: Clear and Coherent  Speech Volume: Normal  Handedness: Right   Mood and Affect  Mood: Irritable  Affect: Congruent   Thought Process  Thought Processes: Goal Directed; Coherent  Descriptions of Associations:Intact  Orientation:Full (Time,  Place and Person)  Thought Content:Logical; WDL  History of Schizophrenia/Schizoaffective disorder:No  Duration of Psychotic Symptoms:No data recorded Hallucinations:Hallucinations: None  Ideas of Reference:None  Suicidal Thoughts:Suicidal Thoughts: Yes, Passive SI Passive Intent and/or Plan: Without Intent; Without Plan  Homicidal Thoughts:Homicidal Thoughts: No   Sensorium  Memory: Immediate Fair  Judgment: Fair  Insight: Fair   Chartered certified accountant: Fair  Attention Span: Fair  Recall: [employer] of Knowledge: Fair  Language: Fair   Psychomotor Activity  Psychomotor Activity:Psychomotor Activity: Normal   Assets  Assets: Manufacturing systems engineer; Desire for Improvement; Financial Resources/Insurance; Housing; Social Support; Resilience   Sleep  Sleep:Sleep: Fair   Physical Exam: Physical Exam Vitals and nursing note reviewed.  HENT:     Head: Normocephalic and atraumatic.     Nose: Nose normal.  Eyes:     Pupils: Pupils are equal, round, and reactive to light.  Pulmonary:     Effort: Pulmonary effort is normal.  Musculoskeletal:        General: Normal range of motion.      Cervical back: Normal range of motion.  Skin:    General: Skin is dry.  Neurological:     Mental Status: He is alert and oriented to person, place, and time.  Psychiatric:        Attention and Perception: Attention and perception normal.        Mood and Affect: Affect is flat.        Speech: Speech normal.        Behavior: Behavior is cooperative.        Thought Content: Thought content includes suicidal ideation. Thought content does not include suicidal plan.        Cognition and Memory: Cognition normal.        Judgment: Judgment is impulsive and inappropriate.    Review of Systems  Psychiatric/Behavioral:  Positive for substance abuse and suicidal ideas. Negative for depression and memory loss. The patient is not nervous/anxious and does not have insomnia.   All other systems reviewed and are negative.  Blood pressure 130/88, pulse 95, temperature 97.8 F (36.6 C), temperature source Oral, resp. rate 16, height 6\' 2"  (1.88 m), weight 90.7 kg, SpO2 97%. Body mass index is 25.68 kg/m.   Disposition: No evidence of imminent risk to self or others at present.   Patient does not meet criteria for psychiatric inpatient admission. Discussed crisis plan, support from social network, calling 911, coming to the Emergency Department, and calling Suicide Hotline.  Jearld Lesch, NP 10/08/2022 2:31 AM

## 2022-10-08 NOTE — ED Notes (Signed)
[facility] called for transport

## 2022-10-08 NOTE — ED Notes (Signed)
TTS and Psych NP speaking with patient. 

## 2022-10-08 NOTE — Discharge Instructions (Signed)
Return to the ER for recurrent or worsening symptoms, feelings of hurting yourself or others, or other concerns. 

## 2022-10-25 ENCOUNTER — Emergency Department (HOSPITAL_COMMUNITY)
Admission: EM | Admit: 2022-10-25 | Discharge: 2022-10-25 | Disposition: A | Discharge status: Other Acute Inpatient Hospital | Payer: 59 | Source: Home / Self Care | Attending: Emergency Medicine | Admitting: Emergency Medicine

## 2022-10-25 ENCOUNTER — Other Ambulatory Visit: Payer: Self-pay

## 2022-10-25 ENCOUNTER — Inpatient Hospital Stay
Admission: RE | Admit: 2022-10-25 | Discharge: 2022-11-01 | DRG: 885 | Disposition: A | Discharge status: Home / Self Care | Payer: 59 | Source: Intra-hospital | Attending: Psychiatry | Admitting: Psychiatry

## 2022-10-25 DIAGNOSIS — Z9151 Personal history of suicidal behavior: Secondary | ICD-10-CM

## 2022-10-25 DIAGNOSIS — I129 Hypertensive chronic kidney disease with stage 1 through stage 4 chronic kidney disease, or unspecified chronic kidney disease: Secondary | ICD-10-CM | POA: Insufficient documentation

## 2022-10-25 DIAGNOSIS — E785 Hyperlipidemia, unspecified: Secondary | ICD-10-CM | POA: Diagnosis present

## 2022-10-25 DIAGNOSIS — N189 Chronic kidney disease, unspecified: Secondary | ICD-10-CM | POA: Diagnosis present

## 2022-10-25 DIAGNOSIS — E8881 Metabolic syndrome: Secondary | ICD-10-CM | POA: Diagnosis present

## 2022-10-25 DIAGNOSIS — G8929 Other chronic pain: Secondary | ICD-10-CM | POA: Diagnosis present

## 2022-10-25 DIAGNOSIS — R45851 Suicidal ideations: Secondary | ICD-10-CM | POA: Insufficient documentation

## 2022-10-25 DIAGNOSIS — G2581 Restless legs syndrome: Secondary | ICD-10-CM | POA: Diagnosis present

## 2022-10-25 DIAGNOSIS — F1910 Other psychoactive substance abuse, uncomplicated: Secondary | ICD-10-CM | POA: Diagnosis present

## 2022-10-25 DIAGNOSIS — Z8249 Family history of ischemic heart disease and other diseases of the circulatory system: Secondary | ICD-10-CM | POA: Diagnosis not present

## 2022-10-25 DIAGNOSIS — E1122 Type 2 diabetes mellitus with diabetic chronic kidney disease: Secondary | ICD-10-CM | POA: Diagnosis present

## 2022-10-25 DIAGNOSIS — Z818 Family history of other mental and behavioral disorders: Secondary | ICD-10-CM | POA: Diagnosis not present

## 2022-10-25 DIAGNOSIS — I252 Old myocardial infarction: Secondary | ICD-10-CM

## 2022-10-25 DIAGNOSIS — I2510 Atherosclerotic heart disease of native coronary artery without angina pectoris: Secondary | ICD-10-CM | POA: Insufficient documentation

## 2022-10-25 DIAGNOSIS — Z79899 Other long term (current) drug therapy: Secondary | ICD-10-CM | POA: Diagnosis not present

## 2022-10-25 DIAGNOSIS — F5105 Insomnia due to other mental disorder: Secondary | ICD-10-CM | POA: Diagnosis present

## 2022-10-25 DIAGNOSIS — F411 Generalized anxiety disorder: Secondary | ICD-10-CM | POA: Insufficient documentation

## 2022-10-25 DIAGNOSIS — Z7984 Long term (current) use of oral hypoglycemic drugs: Secondary | ICD-10-CM | POA: Diagnosis not present

## 2022-10-25 DIAGNOSIS — Z5986 Financial insecurity: Secondary | ICD-10-CM

## 2022-10-25 DIAGNOSIS — F3132 Bipolar disorder, current episode depressed, moderate: Secondary | ICD-10-CM | POA: Diagnosis present

## 2022-10-25 DIAGNOSIS — F332 Major depressive disorder, recurrent severe without psychotic features: Secondary | ICD-10-CM | POA: Diagnosis present

## 2022-10-25 DIAGNOSIS — F17210 Nicotine dependence, cigarettes, uncomplicated: Secondary | ICD-10-CM | POA: Diagnosis present

## 2022-10-25 LAB — URINE DRUG SCREEN, QUALITATIVE (ARMC ONLY)
Amphetamines, Ur Screen: NOT DETECTED
Barbiturates, Ur Screen: NOT DETECTED
Benzodiazepine, Ur Scrn: NOT DETECTED
Cannabinoid 50 Ng, Ur ~~LOC~~: NOT DETECTED
Cocaine Metabolite,Ur ~~LOC~~: NOT DETECTED
MDMA (Ecstasy)Ur Screen: NOT DETECTED
Methadone Scn, Ur: NOT DETECTED
Opiate, Ur Screen: NOT DETECTED
Phencyclidine (PCP) Ur S: NOT DETECTED
Tricyclic, Ur Screen: POSITIVE — AB

## 2022-10-25 LAB — COMPREHENSIVE METABOLIC PANEL
ALT: 20 U/L (ref 0–44)
AST: 18 U/L (ref 15–41)
Albumin: 4.3 g/dL (ref 3.5–5.0)
Alkaline Phosphatase: 99 U/L (ref 38–126)
Anion gap: 11 (ref 5–15)
BUN: 19 mg/dL (ref 6–20)
CO2: 21 mmol/L — ABNORMAL LOW (ref 22–32)
Calcium: 9.3 mg/dL (ref 8.9–10.3)
Chloride: 108 mmol/L (ref 98–111)
Creatinine, Ser: 0.88 mg/dL (ref 0.61–1.24)
GFR, Estimated: >60 mL/min (ref >60)
Glucose, Bld: 145 mg/dL — ABNORMAL HIGH (ref 70–99)
Potassium: 4.1 mmol/L (ref 3.5–5.1)
Sodium: 140 mmol/L (ref 135–145)
Total Bilirubin: 0.6 mg/dL (ref 0.3–1.2)
Total Protein: 7.2 g/dL (ref 6.5–8.1)

## 2022-10-25 LAB — CBC
HCT: 44.4 % (ref 39.0–52.0)
Hemoglobin: 14.2 g/dL (ref 13.0–17.0)
MCH: 28.5 pg (ref 26.0–34.0)
MCHC: 32 g/dL (ref 30.0–36.0)
MCV: 89 fL (ref 80.0–100.0)
Platelets: 291 10*3/uL (ref 150–400)
RBC: 4.99 MIL/uL (ref 4.22–5.81)
RDW: 14.6 % (ref 11.5–15.5)
WBC: 5.6 10*3/uL (ref 4.0–10.5)
nRBC: 0 % (ref 0.0–0.2)

## 2022-10-25 LAB — ETHANOL: Alcohol, Ethyl (B): <10 mg/dL (ref <10)

## 2022-10-25 LAB — ACETAMINOPHEN LEVEL: Acetaminophen (Tylenol), Serum: <10 ug/mL — ABNORMAL LOW (ref 10–30)

## 2022-10-25 LAB — SALICYLATE LEVEL: Salicylate Lvl: <7 mg/dL — ABNORMAL LOW (ref 7.0–30.0)

## 2022-10-25 MED ORDER — ENSURE ENLIVE PO LIQD
237.0000 mL | Freq: Two times a day (BID) | ORAL | Status: DC
  Administered 2022-10-26 – 2022-11-01 (×12): 237 mL via ORAL

## 2022-10-25 MED ORDER — ROPINIROLE HCL 0.25 MG PO TABS
0.5000 mg | ORAL_TABLET | Freq: Every day | ORAL | Status: DC
  Filled 2022-10-25: qty 2

## 2022-10-25 MED ORDER — ROPINIROLE HCL 0.25 MG PO TABS
0.5000 mg | ORAL_TABLET | Freq: Every day | ORAL | Status: DC
  Administered 2022-10-25 – 2022-10-31 (×7): 0.5 mg via ORAL
  Filled 2022-10-25 (×7): qty 2

## 2022-10-25 MED ORDER — HYDROXYZINE HCL 25 MG PO TABS: 50.0000 mg | ORAL_TABLET | Freq: Three times a day (TID) | ORAL | Status: DC | PRN

## 2022-10-25 MED ORDER — DIPHENHYDRAMINE HCL 50 MG/ML IJ SOLN
50.0000 mg | Freq: Three times a day (TID) | INTRAMUSCULAR | Status: DC | PRN
  Administered 2022-10-27: 50 mg via INTRAMUSCULAR
  Filled 2022-10-25: qty 1

## 2022-10-25 MED ORDER — DIPHENHYDRAMINE HCL 25 MG PO CAPS
50.0000 mg | ORAL_CAPSULE | Freq: Three times a day (TID) | ORAL | Status: DC | PRN
  Administered 2022-10-26 – 2022-10-27 (×4): 50 mg via ORAL
  Filled 2022-10-25 (×4): qty 2

## 2022-10-25 MED ORDER — LORAZEPAM 2 MG PO TABS
2.0000 mg | ORAL_TABLET | Freq: Three times a day (TID) | ORAL | Status: DC | PRN
  Administered 2022-10-26: 2 mg via ORAL
  Filled 2022-10-25 (×2): qty 1

## 2022-10-25 MED ORDER — HYDROXYZINE HCL 50 MG PO TABS
50.0000 mg | ORAL_TABLET | Freq: Three times a day (TID) | ORAL | Status: DC | PRN
  Administered 2022-10-26 – 2022-10-30 (×7): 50 mg via ORAL
  Filled 2022-10-25 (×7): qty 1

## 2022-10-25 MED ORDER — ACETAMINOPHEN 325 MG PO TABS
650.0000 mg | ORAL_TABLET | Freq: Four times a day (QID) | ORAL | Status: DC | PRN
  Administered 2022-10-27 – 2022-10-30 (×7): 650 mg via ORAL
  Filled 2022-10-25 (×7): qty 2

## 2022-10-25 MED ORDER — MIRTAZAPINE 15 MG PO TABS
15.0000 mg | ORAL_TABLET | Freq: Every day | ORAL | Status: DC
  Administered 2022-10-25 – 2022-10-26 (×2): 15 mg via ORAL
  Filled 2022-10-25 (×3): qty 1

## 2022-10-25 MED ORDER — MAGNESIUM HYDROXIDE 400 MG/5ML PO SUSP: 30.0000 mL | Freq: Every day | ORAL | Status: DC | PRN

## 2022-10-25 MED ORDER — NICOTINE POLACRILEX 2 MG MT GUM
2.0000 mg | CHEWING_GUM | OROMUCOSAL | Status: DC | PRN
  Administered 2022-10-26 – 2022-10-31 (×3): 2 mg via ORAL
  Filled 2022-10-25 (×3): qty 1

## 2022-10-25 MED ORDER — HALOPERIDOL 5 MG PO TABS
5.0000 mg | ORAL_TABLET | Freq: Three times a day (TID) | ORAL | Status: DC | PRN
  Administered 2022-10-26 – 2022-10-27 (×4): 5 mg via ORAL
  Filled 2022-10-25 (×4): qty 1

## 2022-10-25 MED ORDER — QUETIAPINE FUMARATE 200 MG PO TABS: 800.0000 mg | ORAL_TABLET | Freq: Every day | ORAL | Status: DC

## 2022-10-25 MED ORDER — HYDROXYZINE HCL 25 MG PO TABS
25.0000 mg | ORAL_TABLET | Freq: Once | ORAL | Status: AC
  Administered 2022-10-25: 25 mg via ORAL
  Filled 2022-10-25: qty 1

## 2022-10-25 MED ORDER — DOXEPIN HCL 25 MG PO CAPS: 25.0000 mg | ORAL_CAPSULE | Freq: Every day | ORAL | Status: DC

## 2022-10-25 MED ORDER — HALOPERIDOL LACTATE 5 MG/ML IJ SOLN
5.0000 mg | Freq: Three times a day (TID) | INTRAMUSCULAR | Status: DC | PRN
  Administered 2022-10-27: 5 mg via INTRAMUSCULAR
  Filled 2022-10-25: qty 1

## 2022-10-25 MED ORDER — MIRTAZAPINE 15 MG PO TABS: 15.0000 mg | ORAL_TABLET | Freq: Every day | ORAL | Status: DC

## 2022-10-25 MED ORDER — ALUM & MAG HYDROXIDE-SIMETH 200-200-20 MG/5ML PO SUSP
30.0000 mL | ORAL | Status: DC | PRN
  Administered 2022-10-31 (×2): 30 mL via ORAL
  Filled 2022-10-25 (×3): qty 30

## 2022-10-25 MED ORDER — LORAZEPAM 2 MG/ML IJ SOLN: 2.0000 mg | Freq: Three times a day (TID) | INTRAMUSCULAR | Status: DC | PRN

## 2022-10-25 NOTE — BH Assessment (Signed)
Comprehensive Clinical Assessment (CCA) Note  10/25/2022 Michael Schmitt [id number]  Michael Schmitt, 52 year old male who presents to [facility] ED voluntarily for treatment. Per triage note, Pt to ED for SI, states already tried to hang self this am with extension cord. No bruising noted. Pt repeatedly asking when he is going to get a bed and if he can have something for anxiety.   During TTS assessment pt presents alert and oriented x 4, restless but cooperative, and mood-congruent with affect. The pt does not appear to be responding to internal or external stimuli. Neither is the pt presenting with any delusional thinking. Pt verified the information provided to triage RN.   Pt identifies his main complaint to be that he is suicidal and tried to hang himself this morning; however, he stopped. Patient reports he is severely depressed and has been for 15 years. Patient reports worsening symptoms of increased anxiety, hopelessness, worthlessness, and insomnia within the past couple of days. Patient states he will do whatever it takes to harm himself. Patient reports a prior attempt with a gun where he pulled the trigger, but the gun did not "go off". Patient denies using any illicit substances and occasional alcohol use. Pt reports INPT hx at [facility] about a month ago where he was prescribed Klonopin in which he has since ran out. Patient is not currently being followed by a provider but has an appointment at [facility] on 11/13/22. Patient lives with his mother, sister and 2 children. Patient receives disability. Pt denies current HI/AH/VH.    Per Dr. Valentino Hue, NP, pt is recommended for inpatient psychiatric admission.  Chief Complaint:  Chief Complaint  Patient presents with   Suicidal   Visit Diagnosis: Major depressive disorder    CCA Screening, Triage and Referral (STR)  Patient Reported Information How did you hear about [place]? Self  Referral name: No data recorded Referral phone number:  No data recorded  Whom do you see for routine medical problems? No data recorded Practice/Facility Name: No data recorded Practice/Facility Phone Number: No data recorded Name of Contact: No data recorded Contact Number: No data recorded Contact Fax Number: No data recorded Prescriber Name: No data recorded Prescriber Address (if known): No data recorded  What Is the Reason for Your Visit/Call Today? Patient came to ED due to suicide attempt. Patient reports he tried to hang himself.  How Long Has This Been Causing You Problems? > than 6 months  What Do You Feel Would Help You the Most Today? Treatment for Depression or other mood problem; Medication(s)   Have You Recently Been in Any Inpatient Treatment (Hospital/Detox/Crisis Center/28-Day Program)? No data recorded Name/Location of Program/Hospital:No data recorded How Long Were You There? No data recorded When Were You Discharged? No data recorded  Have You Ever Received Services From [facility] Before? No data recorded Who Do You See at [facility]? No data recorded  Have You Recently Had Any Thoughts About Hurting Yourself? Yes  Are You Planning to Commit Suicide/Harm Yourself At This time? No   Have you Recently Had Thoughts About Hurting Someone Karolee Ohs? No  Explanation: n/a   Have You Used Any Alcohol or Drugs in the Past 24 Hours? No  How Long Ago Did You Use Drugs or Alcohol? No data recorded What Did You Use and How Much? n/a   Do You Currently Have a Therapist/Psychiatrist? No  Name of Therapist/Psychiatrist: [facility]   Have You Been Recently Discharged From Any Office Practice or Programs?  Yes  Explanation of Discharge From Practice/Program: [facility]- July 2024     CCA Screening Triage Referral Assessment Type of Contact: Face-to-Face  Is this Initial or Reassessment? No data recorded Date Telepsych consult ordered in [facility]:  No data recorded Time Telepsych consult ordered in [facility]:   No data recorded  Patient Reported Information Reviewed? No data recorded Patient Left Without Being Seen? No data recorded Reason for Not Completing Assessment: No data recorded  Collateral Involvement: None provided   Does Patient Have a Court Appointed Legal Guardian? No data recorded Name and Contact of Legal Guardian: No data recorded If Minor and Not Living with Parent(s), Who has Custody? No data recorded Is CPS involved or ever been involved? Never  Is APS involved or ever been involved? Never   Patient Determined To Be At Risk for Harm To Self or Others Based on Review of Patient Reported Information or Presenting Complaint? Yes, for Self-Harm  Method: No Plan  Availability of Means: No access or NA  Intent: Clearly intends on inflicting harm that could cause death  Notification Required: No need or identified person  Additional Information for Danger to Others Potential: Previous attempts  Additional Comments for Danger to Others Potential: No data recorded Are There Guns or Other Weapons in Your Home? No  Types of Guns/Weapons: No data recorded Are These Weapons Safely Secured?                            No  Who Could Verify You Are Able To Have These Secured: No data recorded Do You Have any Outstanding Charges, Pending Court Dates, Parole/Probation? No data recorded Contacted To Inform of Risk of Harm To Self or Others: Other: Comment   Location of Assessment: [facility] ED   Does Patient Present under Involuntary Commitment? No  IVC Papers Initial File Date: No data recorded  [place] of Residence: [place]   Patient Currently Receiving the Following Services: Not Receiving Services   Determination of Need: Emergent (2 hours)   Options For Referral: ED Visit; Inpatient Hospitalization; Outpatient Therapy; Medication Management     CCA Biopsychosocial Intake/Chief Complaint:  No data recorded Current Symptoms/Problems: No data recorded  Patient  Reported Schizophrenia/Schizoaffective Diagnosis in Past: No   Strengths: Family supports; stable housing; has fair insight  Preferences: No data recorded Abilities: No data recorded  Type of Services Patient Feels are Needed: No data recorded  Initial Clinical Notes/Concerns: No data recorded  Mental Health Symptoms Depression:   Change in energy/activity; Difficulty Concentrating; Hopelessness; Worthlessness; Irritability; Fatigue; Sleep (too much or little)   Duration of Depressive symptoms:  Greater than two weeks   Mania:   None   Anxiety:    Difficulty concentrating; Irritability; Restlessness; Tension; Worrying   Psychosis:   None   Duration of Psychotic symptoms: No data recorded  Trauma:   N/A   Obsessions:   N/A   Compulsions:   "Driven" to perform behaviors/acts; Good insight; Intended to reduce stress or prevent another outcome; Disrupts with routine/functioning; Repeated behaviors/mental acts; Intrusive/time consuming   Inattention:   N/A   Hyperactivity/Impulsivity:   Feeling of restlessness   Oppositional/Defiant Behaviors:   N/A   Emotional Irregularity:   Recurrent suicidal behaviors/gestures/threats; Potentially harmful impulsivity   Other Mood/Personality Symptoms:   Pt's medical record indicates diagnosis of personality disorder    Mental Status Exam Appearance and self-care  Stature:   Tall   Weight:   Average  weight   Clothing:   Disheveled   Grooming:   Neglected   Cosmetic use:   None   Posture/gait:   Slumped; Tense   Motor activity:   Restless; Tremor   Sensorium  Attention:   Normal   Concentration:   Anxiety interferes   Orientation:   X5   Recall/memory:   Normal   Affect and Mood  Affect:   Depressed; Negative   Mood:   Depressed; Irritable; Negative   Relating  Eye contact:   Normal   Facial expression:   Depressed   Attitude toward examiner:   Cooperative; Irritable   Thought and  Language  Speech flow:  Pressured   Thought content:   Appropriate to Mood and Circumstances   Preoccupation:   None   Hallucinations:   None   Organization:  No data recorded  [employer] of Knowledge:   Average   Intelligence:   Average   Abstraction:   Normal   Judgement:   Fair   Dance movement psychotherapist:   Adequate   Insight:   Fair   Decision Making:   Impulsive   Social Functioning  Social Maturity:   Impulsive   Social Judgement:   Heedless   Stress  Stressors:   Illness   Coping Ability:   Contractor Deficits:   Decision making   Supports:   Family; Friends/Service system     Religion:    Leisure/Recreation:    Exercise/Diet: Exercise/Diet Do You Follow a Special Diet?: No Do You Have Any Trouble Sleeping?: No   CCA Employment/Education Employment/Work Situation: Employment / Work Situation Employment Situation: On disability Why is Patient on Disability: Per pt, "neck pain and depression." How Long has Patient Been on Disability: Since 2015. Patient's Job has Been Impacted by Current Illness: Yes Describe how Patient's Job has Been Impacted: Pt is unable to work.  Has Patient ever Been in the Military?: No  Education:     CCA Family/Childhood History Family and Relationship History: Family history Marital status: Widowed Widowed, when?: "A long time ago. I don't even remember. We weren't together when she died."  Does patient have children?: Yes How many children?: 3 How is patient's relationship with their children?: Pt reports, two of his children live with him and his sister and that he has a good relationship with his children.  Childhood History:     Child/Adolescent Assessment:     CCA Substance Use Alcohol/Drug Use: Alcohol / Drug Use Pain Medications: See MAR Prescriptions: See MAR Over the Counter: See MAR History of alcohol / drug use?: No history of alcohol / drug abuse Longest  period of sobriety (when/how long): Unknown                         ASAM's:  Six Dimensions of Multidimensional Assessment  Dimension 1:  Acute Intoxication and/or Withdrawal Potential:      Dimension 2:  Biomedical Conditions and Complications:      Dimension 3:  Emotional, Behavioral, or Cognitive Conditions and Complications:     Dimension 4:  Readiness to Change:     Dimension 5:  Relapse, Continued use, or Continued Problem Potential:     Dimension 6:  Recovery/Living Environment:     ASAM Severity Score:    ASAM Recommended Level of Treatment:     Substance use Disorder (SUD)    Recommendations for Services/Supports/Treatments:    DSM5 Diagnoses: Patient Active Problem  List   Diagnosis Date Noted   Bipolar affective disorder, currently depressed, moderate (HCC) 10/08/2022   Stress reaction causing mixed disturbance of emotion and conduct 06/23/2022   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   Drug-seeking behavior 02/25/2021   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   Alcohol abuse with alcohol-induced mood disorder (HCC) 09/21/2018   Personality disorder (HCC) 12/31/2017   Generalized anxiety disorder 02/05/2017   Long term prescription benzodiazepine use 11/21/2016   Opiate use 11/21/2016   Type 2 diabetes mellitus without complication, without long-term current use of insulin (HCC) 10/19/2016   Chronic Suicidal ideation 09/15/2016   Alcohol use disorder, severe, 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HCC) 06/26/2016   Benzodiazepine abuse (HCC) 06/11/2016   Severe episode of recurrent major depressive disorder, without psychotic features (HCC) 04/15/2013    Patient Centered Plan: Patient is on the following Treatment Plan(s):  Anxiety and Depression   Referrals to Alternative Service(s): Referred to Alternative Service(s):   Place:   Date:   Time:    Referred to Alternative Service(s):   Place:   Date:   Time:    Referred to Alternative Service(s):   Place:   Date:   Time:     Referred to Alternative Service(s):   Place:   Date:   Time:      @BHCOLLABOFCARE @  Zera Markwardt R Theatre manager, Counselor, LCAS-A

## 2022-10-25 NOTE — ED Triage Notes (Signed)
Pt to ED for SI, states already tried to hang self this am with extension cord. No bruising noted. Pt repeatedly asking when he is going to get a bed and if he can have something for anxiety.

## 2022-10-25 NOTE — BH Assessment (Signed)
Patient is to be admitted to [facility] tonight 10/25/22 at 8:00pm by Dr.  Marval Regal .  Attending Physician will be Dr.  Marval Regal .   Patient has been assigned to room 306, by [facility] Charge Nurse Gigi.    ER staff is aware of the admission: Drinda Butts, ER Secretary   Dr. Roxan Hockey, ER MD  Amy, Patient's Nurse

## 2022-10-25 NOTE — ED Notes (Signed)
NT offered snack, pt refused.

## 2022-10-25 NOTE — Group Note (Signed)
Date:  10/25/2022 Time:  8:44 PM  Group Topic/Focus:  Wrap-Up Group:   The focus of this group is to help patients review their daily goal of treatment and discuss progress on daily workbooks.    Participation Level:  Active  Participation Quality:  Appropriate, Sharing, and Supportive  Affect:  Appropriate  Cognitive:  Appropriate  Insight: Appropriate and Good  Engagement in Group:  Engaged and Supportive  Modes of Intervention:  Support  Additional Comments:     Belva Crome 10/25/2022, 8:44 PM

## 2022-10-25 NOTE — ED Notes (Signed)
VOL  CONSULT  DONE  PENDING  PLACEMENT 

## 2022-10-25 NOTE — Progress Notes (Signed)
.  nsn  10/25/22 2300  Psych Admission Type (Psych Patients Only)  Admission Status Voluntary  Psychosocial Assessment  Patient Complaints Anxiety;Depression;Self-harm thoughts  Eye Contact Fair  Facial Expression Sad  Affect Sad  Speech Logical/coherent  Interaction Assertive  Motor Activity Slow  Appearance/Hygiene Disheveled;Body odor  Behavior Characteristics Anxious  Mood Anxious;Apathetic  Thought Process  Coherency WDL  Content Blaming others;Blaming self  Delusions None reported or observed  Perception WDL  Hallucination None reported or observed  Judgment WDL  Confusion WDL  Danger to Self  Current suicidal ideation? Passive  Agreement Not to Harm Self Yes  Danger to Others  Danger to Others None reported or observed

## 2022-10-25 NOTE — Plan of Care (Signed)
Patient new to the unit tonight, hasn't had time to progress  Problem: Education: Goal: Knowledge of General Education information will improve Description: Including pain rating scale, medication(s)/side effects and non-pharmacologic comfort measures Outcome: Not Progressing   Problem: Health Behavior/Discharge Planning: Goal: Ability to manage health-related needs will improve Outcome: Not Progressing   Problem: Clinical Measurements: Goal: Ability to maintain clinical measurements within normal limits will improve Outcome: Not Progressing Goal: Will remain free from infection Outcome: Not Progressing Goal: Diagnostic test results will improve Outcome: Not Progressing Goal: Respiratory complications will improve Outcome: Not Progressing Goal: Cardiovascular complication will be avoided Outcome: Not Progressing   Problem: Activity: Goal: Risk for activity intolerance will decrease Outcome: Not Progressing   Problem: Nutrition: Goal: Adequate nutrition will be maintained Outcome: Not Progressing   Problem: Coping: Goal: Level of anxiety will decrease Outcome: Not Progressing   Problem: Elimination: Goal: Will not experience complications related to bowel motility Outcome: Not Progressing Goal: Will not experience complications related to urinary retention Outcome: Not Progressing   Problem: Pain Managment: Goal: General experience of comfort will improve Outcome: Not Progressing   Problem: Safety: Goal: Ability to remain free from injury will improve Outcome: Not Progressing   Problem: Skin Integrity: Goal: Risk for impaired skin integrity will decrease Outcome: Not Progressing   Problem: Education: Goal: Knowledge of [place] General Education information/materials will improve Outcome: Not Progressing Goal: Emotional status will improve Outcome: Not Progressing Goal: Mental status will improve Outcome: Not Progressing Goal: Verbalization of  understanding the information provided will improve Outcome: Not Progressing   Problem: Activity: Goal: Interest or engagement in activities will improve Outcome: Not Progressing Goal: Sleeping patterns will improve Outcome: Not Progressing   Problem: Coping: Goal: Ability to verbalize frustrations and anger appropriately will improve Outcome: Not Progressing Goal: Ability to demonstrate self-control will improve Outcome: Not Progressing   Problem: Health Behavior/Discharge Planning: Goal: Identification of resources available to assist in meeting health care needs will improve Outcome: Not Progressing Goal: Compliance with treatment plan for underlying cause of condition will improve Outcome: Not Progressing   Problem: Physical Regulation: Goal: Ability to maintain clinical measurements within normal limits will improve Outcome: Not Progressing   Problem: Safety: Goal: Periods of time without injury will increase Outcome: Not Progressing
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
## 2022-10-25 NOTE — ED Notes (Signed)
Trash removed from pts room prior to pt receiving snack. Snack provided and brought to pts room for consumption.

## 2022-10-25 NOTE — ED Notes (Signed)
Dinner meal provided.  Pt states he doesn't want to talk with anyone by phone today.  Pt reports he does not have a wife or girlfriend. Pt has received several phone calls today but doesn't want to talk to anyone.

## 2022-10-25 NOTE — Progress Notes (Signed)
Pt admitted to BMU.  Literature detailing the patient's rights, responsibilities, and visitor guidelines provided. Skin search unremarkable with no signs or symptoms of infection. Pt expresses continual SI and a recent attempt by hanging with an extension cord.  At time of skin check no visible signs of trauma.  Pt attributes his decline to difficulty seeing provider and getting klonopin, which was helping.  Pt goals are to "get better" and "get over depression."  Pt was oriented to room and to unit. Pt stated no remaining questions or concerns at present. Pt wants seroquel and klonopin.  Pt was given scheduled PM medication and encouraged to discuss medication needs with provider in AM.      10/25/22 2300  Psych Admission Type (Psych Patients Only)  Admission Status Voluntary  Psychosocial Assessment  Patient Complaints Anxiety;Depression;Self-harm thoughts  Eye Contact Fair  Facial Expression Sad  Affect Sad  Speech Logical/coherent  Interaction Assertive  Motor Activity Slow  Appearance/Hygiene Disheveled;Body odor  Behavior Characteristics Anxious  Mood Anxious;Apathetic  Thought Process  Coherency WDL  Content Blaming others;Blaming self  Delusions None reported or observed  Perception WDL  Hallucination None reported or observed  Judgment WDL  Confusion WDL  Danger to Self  Current suicidal ideation? Passive  Agreement Not to Harm Self Yes  Danger to Others  Danger to Others None reported or observed

## 2022-10-25 NOTE — ED Provider Notes (Addendum)
[facility] Provider Note    Event Date/Time   First MD Initiated Contact with Patient 10/25/22 [phone]     (approximate)   History   Suicidal   HPI  Michael Schmitt is a 52 y.o. male past medical history significant for anxiety, CAD, depression, who presents to the emergency department with suicidal ideation.  States that he had a plan to kill himself by hanging.  States that he grabbed a rope but did not wrapped around his neck.  Prior suicide attempt in the past with a firearm.  No longer has firearms at home.  Lives at home with his mother and 2 children.  States that he has not been sleeping and is feeling very restless and shaky and wanting something for anxiety.     Physical Exam   Triage Vital Signs: ED Triage Vitals  Encounter Vitals Group     BP 10/25/22 0719 131/76     Systolic BP Percentile --      Diastolic BP Percentile --      Pulse Rate 10/25/22 0719 86     Resp 10/25/22 0719 18     Temp 10/25/22 0719 97.7 F (36.5 C)     Temp src --      SpO2 10/25/22 0719 100 %     Weight 10/25/22 0721 200 lb (90.7 kg)     Height 10/25/22 0721 6\' 2"  (1.88 m)     Head Circumference --      Peak Flow --      Pain Score 10/25/22 0721 5     Pain Loc --      Pain Education --      Exclude from Growth Chart --     Most recent vital signs: Vitals:   10/25/22 0719  BP: 131/76  Pulse: 86  Resp: 18  Temp: 97.7 F (36.5 C)  SpO2: 100%    Physical Exam Constitutional:      Appearance: He is well-developed.  HENT:     Head: Atraumatic.  Eyes:     Conjunctiva/sclera: Conjunctivae normal.  Cardiovascular:     Rate and Rhythm: Regular rhythm.  Pulmonary:     Effort: No respiratory distress.  Musculoskeletal:     Cervical back: Normal range of motion.  Skin:    General: Skin is warm.  Neurological:     Mental Status: He is alert. Mental status is at baseline.  Psychiatric:        Attention and Perception: Attention normal.        Mood  and Affect: Mood is anxious.        Speech: Speech normal.        Behavior: Behavior is agitated.        Thought Content: Thought content includes suicidal ideation. Thought content includes suicidal plan.     IMPRESSION / MDM / ASSESSMENT AND PLAN / ED COURSE  I reviewed the triage vital signs and the nursing notes.  Differential diagnosis including suicidal ideation secondary to depression, bipolar disorder with acute mania, intoxication, secondary gain   LABS (all labs ordered are listed, but only abnormal results are displayed) Labs interpreted as -    Labs Reviewed  COMPREHENSIVE METABOLIC PANEL - Abnormal; Notable for the following components:      Result Value   CO2 21 (*)    Glucose, Bld 145 (*)    All other components within normal limits  CBC  SALICYLATE LEVEL  ACETAMINOPHEN LEVEL  URINE DRUG SCREEN,  QUALITATIVE ([facility] ONLY)  ETHANOL    MDM    Lab work overall reassuring.  No concern for injury from hanging given that the patient did not tie the rope around his neck.  Consulted psychiatry for further evaluation.  Given Atarax for anxiety.  Psychiatry recommended inpatient placement.  Currently waiting for inpatient bed.  Patient will be admitted to inpatient psychiatric care   The patient has been placed in psychiatric observation due to the need to provide a safe environment for the patient while obtaining psychiatric consultation and evaluation, as well as ongoing medical and medication management to treat the patient's condition.  The patient has not been placed under full IVC at this time.   PROCEDURES:  Critical Care performed: No  Procedures  Patient's presentation is most consistent with acute presentation with potential threat to life or bodily function.   MEDICATIONS ORDERED IN ED: Medications - No data to display  FINAL CLINICAL IMPRESSION(S) / ED DIAGNOSES   Final diagnoses:  Suicidal ideation     Rx / DC Orders   ED Discharge  Orders     None        Note:  This document was prepared using Dragon voice recognition software and may include unintentional dictation errors.   Corena Herter, MD 10/25/22 9604    Corena Herter, MD 10/25/22 1025    Corena Herter, MD 10/25/22 5409

## 2022-10-25 NOTE — ED Notes (Addendum)
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

## 2022-10-25 NOTE — ED Notes (Signed)
Snack trash removed from pts room by pt. Hospital dinner provided with soda of choice. Pt made aware no other drinks other than water will be provided. Meal fully consumed and disposed of by pt.

## 2022-10-25 NOTE — ED Notes (Signed)
Report called to donna rn in BMU.

## 2022-10-26 DIAGNOSIS — F332 Major depressive disorder, recurrent severe without psychotic features: Secondary | ICD-10-CM | POA: Diagnosis not present

## 2022-10-26 MED ORDER — DULOXETINE HCL 20 MG PO CPEP
20.0000 mg | ORAL_CAPSULE | Freq: Two times a day (BID) | ORAL | Status: DC
  Administered 2022-10-26 – 2022-10-27 (×2): 20 mg via ORAL
  Filled 2022-10-26 (×3): qty 1

## 2022-10-26 MED ORDER — CLOPIDOGREL BISULFATE 75 MG PO TABS
75.0000 mg | ORAL_TABLET | Freq: Every day | ORAL | Status: DC
  Administered 2022-10-26 – 2022-11-01 (×7): 75 mg via ORAL
  Filled 2022-10-26 (×7): qty 1

## 2022-10-26 MED ORDER — ATORVASTATIN CALCIUM 20 MG PO TABS
20.0000 mg | ORAL_TABLET | Freq: Every day | ORAL | Status: DC
  Administered 2022-10-26 – 2022-11-01 (×7): 20 mg via ORAL
  Filled 2022-10-26 (×7): qty 1

## 2022-10-26 MED ORDER — ADULT MULTIVITAMIN W/MINERALS CH
1.0000 | ORAL_TABLET | Freq: Every day | ORAL | Status: DC
  Administered 2022-10-26 – 2022-11-01 (×7): 1 via ORAL
  Filled 2022-10-26 (×7): qty 1

## 2022-10-26 MED ORDER — AMLODIPINE BESYLATE 5 MG PO TABS
5.0000 mg | ORAL_TABLET | Freq: Every day | ORAL | Status: DC
  Administered 2022-10-26 – 2022-11-01 (×7): 5 mg via ORAL
  Filled 2022-10-26 (×7): qty 1

## 2022-10-26 MED ORDER — METFORMIN HCL 500 MG PO TABS
1000.0000 mg | ORAL_TABLET | Freq: Two times a day (BID) | ORAL | Status: DC
  Administered 2022-10-26 – 2022-11-01 (×12): 1000 mg via ORAL
  Filled 2022-10-26 (×13): qty 2

## 2022-10-26 MED ORDER — GLIPIZIDE ER 5 MG PO TB24
10.0000 mg | ORAL_TABLET | Freq: Every day | ORAL | Status: DC
  Administered 2022-10-27 – 2022-11-01 (×6): 10 mg via ORAL
  Filled 2022-10-26 (×7): qty 2

## 2022-10-26 MED ORDER — NICOTINE 21 MG/24HR TD PT24
21.0000 mg | MEDICATED_PATCH | Freq: Every day | TRANSDERMAL | Status: DC
  Filled 2022-10-26 (×6): qty 1

## 2022-10-26 MED ORDER — CARVEDILOL 6.25 MG PO TABS
6.2500 mg | ORAL_TABLET | Freq: Two times a day (BID) | ORAL | Status: DC
  Administered 2022-10-26 – 2022-11-01 (×12): 6.25 mg via ORAL
  Filled 2022-10-26 (×13): qty 1

## 2022-10-26 MED ORDER — GABAPENTIN 300 MG PO CAPS
300.0000 mg | ORAL_CAPSULE | Freq: Three times a day (TID) | ORAL | Status: DC
  Administered 2022-10-26 – 2022-10-28 (×7): 300 mg via ORAL
  Filled 2022-10-26 (×7): qty 1

## 2022-10-26 MED ORDER — CYCLOBENZAPRINE HCL 10 MG PO TABS
10.0000 mg | ORAL_TABLET | Freq: Three times a day (TID) | ORAL | Status: DC | PRN
  Administered 2022-10-26 – 2022-10-30 (×7): 10 mg via ORAL
  Filled 2022-10-26 (×7): qty 1

## 2022-10-26 NOTE — Plan of Care (Signed)
  Problem: Education: Goal: Knowledge of General Education information will improve Description: Including pain rating scale, medication(s)/side effects and non-pharmacologic comfort measures Outcome: Progressing   Problem: Coping: Goal: Level of anxiety will decrease Outcome: Not Progressing   Problem: Safety: Goal: Ability to remain free from injury will improve Outcome: Progressing

## 2022-10-26 NOTE — Group Note (Signed)
Date:  10/26/2022 Time:  10:21 AM  Group Topic/Focus:  Crisis Planning:   The purpose of this group is to help patients create a crisis plan for use upon discharge or in the future, as needed.    Participation Level:  Minimal  Participation Quality:  Appropriate  Affect:  Appropriate  Cognitive:  Alert and Appropriate  Insight: Appropriate  Engagement in Group:  Limited  Modes of Intervention:  Exploration  Additional Comments:    Michael Schmitt 10/26/2022, 10:21 AM

## 2022-10-26 NOTE — Progress Notes (Signed)
   10/26/22 1000  Psych Admission Type (Psych Patients Only)  Admission Status Voluntary  Psychosocial Assessment  Patient Complaints Anxiety;Depression;Self-harm thoughts  Eye Contact Fair  Facial Expression Sad  Affect Anxious;Sad  Speech Logical/coherent  Interaction Assertive  Motor Activity Slow;Tremors  Appearance/Hygiene In scrubs;Disheveled  Behavior Characteristics Anxious  Mood Anxious;Apathetic  Thought Process  Coherency WDL  Content Blaming others  Delusions None reported or observed  Perception WDL  Hallucination None reported or observed  Judgment WDL  Confusion WDL  Danger to Self  Current suicidal ideation? Passive  Agreement Not to Harm Self Yes  Description of Agreement Verbal  Danger to Others  Danger to Others None reported or observed   Patient is alert and oriented X4.  Patient with tremors.  Patient denies HI/AVH.  Patient states he is depressed.  Patient given Ativan, Haldol, and Benadryl for anxiety and agitation with good results.  Patient cooperative and compliant and took his meds as ordered.  Patient continues on Q 15 minute checks.
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
## 2022-10-26 NOTE — Progress Notes (Signed)
Pt yelled "help" from his room.  Writer and other staff member responded to pt.  Pt endorsed SI but no plan.  Pt stated "I don't want to be here like this."  Pt asked about medications to assist.  Pt exhibited increasing agitation related to his emotional condition.  Pt was given PRN medications and education was provided.  Pt was encouraged to notify staff of any further concerns.  Pt encouraged to drink Ensure to supplement nutrition.  Pt verbalized understanding and contracted for safety.

## 2022-10-26 NOTE — Group Note (Signed)
Date:  10/26/2022 Time:  10:48 AM  Group Topic/Focus:  OUTDOOR RECREATION    Participation Level:  Minimal  Participation Quality:  Appropriate  Affect:  Appropriate  Cognitive:  Alert  Insight: Appropriate  Engagement in Group:  Limited  Modes of Intervention:  Activity   Shawntrice Salle 10/26/2022, 10:48 AM

## 2022-10-26 NOTE — Group Note (Signed)
Recreation Therapy Group Note   Group Topic:Healthy Support Systems  Group Date: 10/26/2022 Start Time: 1010 End Time: 1100 Facilitators: Rosina Lowenstein, LRT, CTRS Location:  Craft Room  Group Description: Straw Bridge. Individually, patients were given 10 plastic drinking straws and an equal length of masking tape. Using the materials provided, patients were instructed to build a free-standing bridge-like structure to suspend an everyday item (ex: puzzle box or deck of cards) off the floor or table surface. All materials were required to be used in Secondary school teacher. LRT facilitated post-activity discussion reviewing how we, humans, are like the structure we built; when things get too heavy in our life and we do not have adequate supports/coping skills, then we will fall just like the straw-built structure will. LRT focused on how having a "base" or structure on the bottom was necessary for the object to stand, meaning we must be secure and stable first before building on ourselves or others. Patients were encouraged to name 2 healthy supports in their life and reflect on how the skills used in this activity can be generalized to daily life post discharge.   Goal Area(s) Addressed:  Patient will identify two healthy support systems in their life. Patient will work on Product manager. Patient will verbalize the importance of having a strong and steady "base".  Patient will follow multi-step directions. Patients will engage in creativity and use all provided materials.   Affect/Mood: Flat and Irritable   Participation Level: Minimal   Participation Quality: Independent   Behavior: Nonchalant   Speech/Thought Process: Coherent   Insight: Limited   Judgement: Limited   Modes of Intervention: Activity   Patient Response to Interventions:  Receptive   Education Outcome:  In group clarification offered    Clinical Observations/Individualized Feedback: Ollen was minimally  active in their participation of session activities and group discussion. Pt identified "my mom and kids" as his healthy supports. Pt was very fidgety and kept moving his head around in a circle while opening and closing his mouth. Pt did not seem interested in activity or discussion.    Plan: Continue to engage patient in RT group sessions 2-3x/week.   Rosina Lowenstein, LRT, CTRS 10/26/2022 11:20 AM
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
## 2022-10-26 NOTE — H&P (Addendum)
Psychiatric Admission Assessment Adult  Patient Identification: Michael Schmitt MRN:  [id number] Date of Evaluation:  10/26/2022 Chief Complaint:  Major depressive disorder, recurrent episode, severe (HCC) [F33.2] Principal Diagnosis: Major depressive disorder, recurrent episode, severe (HCC) Diagnosis:  Principal Problem:   Major depressive disorder, recurrent episode, severe (HCC)  History of Present Illness: Pt is a 52 y/o male w/ history of polysubstance abuse, major depressive disorder, history of presentation to ED for secondary gain. Per chart review, he has presented to the ED several times with complaints of SI and attempts to hang himself. Last month, he has presented to [facility] twice and [facility] once. He presented to [facility] on 10/07/22 for suicidal ideation and klonopin withdrawal; was seen by psychiatry on 10/08/22 and told psychiatry that he was out of klonopin and is therefore feeling suicidal; was psychiatrically cleared and discharged on 10/08/22. Appears he then presented to [facility] ED the same day, on 10/08/22, with c/o having tried to hang himself the night prior; he was seen by psychiatry and per note from 10/09/22 "Once informed he would not be receiving benzodiazapienes while in the hospital due to not being beneficial for his mental health, Michael Schmitt requested to be discharged home. Reported his recent self reported suicide attempt was contrived in attempt to appear mentally unwell to obtain benzodaipenes." [facility] notes also report that "Michael Schmitt is currently on a [facility] behavioral care plan due to verbal aggression towards RN staff, extreme drug seeking behaviors, malingered reporting, and general lack of benefit receiving inpatient services in the past and is currently not appropraite for [facility] admission." From note on 09/05/22 at [facility], "Pt admits that he was holding rope and having SI. Indicates he knew family was home and would intervene. States he did this 2/2 distress over panic attacks.  Adamantly denying SI now. States he would return to ED if he has SI again. Has demonstrated pattern of seeking help when in crisis rather than acting on SI. Pt has long history of low lethality suicidal gestures w high probability of rescue." Pt presented to [facility] on 10/25/22 for suicidal ideation, and reported to triage that he already tried to hang self this am with extension cord; was seen by psychiatry and recommended for inpatient psychiatric admission.  On assessment today, pt reports chronic history of depression for the past "15 years". He reports chronic suicidal ideations "a while, for a couple of years". He states he cannot sleep at night because of "weird thoughts in my mind that say kill yourself". Denies these are auditory hallucinations. Denies history of auditory visual hallucinations. He tells me he was admitted this time because "the other week I tried to cut myself, the morning before coming to the ED I tried to hang myself". Initially he states he did attempt to hang himself. Later changes his narrative and states he had the cord and was having thoughts but did not attempt to because though of his children. Pt reports history of "quite a few, so many I don't know" suicide attempts. Pt reports history of non suicidal self injurious behavior, hitting himself, reports he last did this yesterday. Pt reports history of multiple inpatient psychiatric hospitalizations. He states his last inpatient psychiatric hospitalization was at [facility] about 1.5 months ago for about 1 week. Pt endorses current passive suicidal ideations. He denies homicidal ideations.   Pt reports multiple medication trials, including trials of antidepressants, mood stabilizers, and antipsychotics. He states he cannot recall all of the medications he has been on but he  recalls lexapro, zoloft, lithium, depakote, zyprexa, thorazine, seroquel. It's unclear if he has had adequate trials of the medications he has been on as  he states his medications are changed often from hospitalizations. He tells me he has been prescribed seroquel and thorazine for sleep and that he likes to change between them when they stop working. He states he feels klonopin is the best for sleep. I have made it clear to the pt he will not be receiving klonopin during his admission. At this, pt appears visibly frustrated.   Pt reports he is on disability "for my neck and depression". He reports chronic pain. He receives $1300 a month. He receives his money on the 3rd of each month.   Pt lives with his mother, sister, and 2 children (24 y/o, 17 y/o).   Pt reports he was in jail last week because he missed his court date for assault of a male.   Pt reports using cigarettes, 3 packs/day. He denies use of alcohol, marijuana, crack/cocaine, methamphetamines, other substances.  Pt reports past psychiatric diagnoses of depression and bipolar disorder. He states he was told at [facility] he doesn't have bipolar disorder. We discussed the criteria for bipolar disorder. He reports he does have periods of irritable mood. He denies periods of inflated self-esteem or grandiosity; decreased need for sleep; more talkative than usual or pressured speech; flight of ideas; distractibility; increase in goal directed activity or psychomotor agitation; indiscretion. He reports he has only experienced these symptoms when using substances. He reports he does have racing thoughts when anxious.   Discussed with pt starting cymbalta 20mg  BID and gabapentin 300mg  TID. Pt in agreement. Pt has oral TD. I discussed with him that this may be related to his history of antipsychotic use. Discussed possibility of starting Ingrezza. He states oral TD does not bother him and he will think about it. He states he has tried Ingrezza in the past and cannot recall if it was helpful.  Associated Signs/Symptoms: Depression Symptoms:  depressed mood, insomnia, fatigue, feelings of  worthlessness/guilt, difficulty concentrating, hopelessness, recurrent thoughts of death, anxiety, panic attacks, disturbed sleep, (Hypo) Manic Symptoms: Irritable Mood, Anxiety Symptoms:  Panic Symptoms, Psychotic Symptoms:   NA PTSD Symptoms: NA Total Time spent with patient: 1 hour  Past Psychiatric History: polysubstance abuse, major depressive disorder  Is the patient at risk to self? Yes.    Has the patient been a risk to self in the past 6 months? Yes.    Has the patient been a risk to self within the distant past? Yes.    Is the patient a risk to others? No.  Has the patient been a risk to others in the past 6 months? Yes.    Has the patient been a risk to others within the distant past? No.   [place] Scale:  Flowsheet Row Admission (Current) from 10/25/2022 in [facility] INPATIENT BEHAVIORAL MEDICINE Most recent reading at 10/25/2022 11:00 PM ED from 10/25/2022 in [facility] Emergency Department at [facility] Most recent reading at 10/25/2022  7:22 AM ED from 10/08/2022 in [facility] Emergency Department at [facility] Most recent reading at 10/07/2022 11:51 PM  C-SSRS RISK CATEGORY High Risk High Risk Low Risk        Prior Inpatient Therapy: Yes.    Prior Outpatient Therapy: Yes.     Alcohol Screening: 1. How often do you have a drink containing alcohol?: Monthly or less 2. How many drinks containing alcohol do you have on a  typical day when you are drinking?: 1 or 2 3. How often do you have six or more drinks on one occasion?: Never AUDIT-C Score: 1 4. How often during the last year have you found that you were not able to stop drinking once you had started?: Never 5. How often during the last year have you failed to do what was normally expected from you because of drinking?: Never 6. How often during the last year have you needed a first drink in the morning to get yourself going after a heavy drinking session?: Never 7. How often during the last year have  you had a feeling of guilt of remorse after drinking?: Never 8. How often during the last year have you been unable to remember what happened the night before because you had been drinking?: Never 9. Have you or someone else been injured as a result of your drinking?: No 10. Has a relative or friend or a doctor or another health worker been concerned about your drinking or suggested you cut down?: No Alcohol Use Disorder Identification Test Final Score (AUDIT): 1 Alcohol Brief Interventions/Follow-up: Alcohol education/Brief advice Substance Abuse History in the last 12 months:  Yes.   Consequences of Substance Abuse: Repeated presentations to ED Previous Psychotropic Medications: Yes  Psychological Evaluations:  Unknown Past Medical History:  Past Medical History:  Diagnosis Date   Anxiety    Anxiety    Arthritis    cerv. stenosis, spondylosis, HNP- lower back , has been followed in pain clinic, has  had injection s in cerv. 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 Cervical three-four.   CARDIAC CATHETERIZATION N/A 01/01/2015   Procedure: Left Heart Cath and Coronary Angiography;  Surgeon: Rinaldo Cloud, MD;  Location: [facility] INVASIVE CV LAB;  Service: Cardiovascular;  Laterality: N/A;   CARDIAC CATHETERIZATION N/A 04/05/2016   Procedure: Left Heart Cath and Coronary Angiography;  Surgeon: Lyn Records, MD;  Location: [facility] INVASIVE CV LAB;  Service: Cardiovascular;  Laterality: N/A;   CARDIAC CATHETERIZATION N/A 04/05/2016   Procedure: Coronary Stent Intervention;  Surgeon: Lyn Records, MD;  Location: [facility] INVASIVE CV LAB;  Service: Cardiovascular;  Laterality: N/A;   CARDIAC CATHETERIZATION N/A 04/05/2016   Procedure: Intravascular Ultrasound/IVUS;  Surgeon: Lyn Records, MD;  Location: [facility] INVASIVE CV LAB;  Service: Cardiovascular;  Laterality: N/A;   NASAL SINUS SURGERY     2005  Family History:  Family History  Problem Relation Age of Onset   Prostate cancer Father    Hypertension Mother    Kidney Stones Mother    Anxiety disorder Mother    Depression Mother    COPD Sister    Hypertension Sister    Diabetes Sister    Depression Sister    Anxiety disorder Sister    Seizures Sister    ADD / ADHD Son    ADD / ADHD Daughter    Family Psychiatric  History: see above Tobacco Screening:  Social History   Tobacco Use  Smoking Status Every Day   Current packs/day: 2.00   Average packs/day: 2.0 packs/day for 17.0 years (34.0 ttl pk-yrs)   Types: Cigarettes, Cigars  Smokeless Tobacco Former  Tobacco Comments   occassional snuff     BH Tobacco Counseling     Are you interested in Tobacco Cessation Medications?  Yes, implement Nicotene Replacement Protocol Counseled patient on smoking cessation:  Yes Reason Tobacco Screening Not Completed: No value filed.       Social History:  Social History   Substance and Sexual Activity  Alcohol Use No   Alcohol/week: 0.0 standard drinks of  alcohol   Comment: last drinked in 3 months.      Social History   Substance and Sexual Activity  Drug Use No    Additional Social History:                           Allergies:   Allergies  Allergen Reactions   Prednisone Other (See Comments)    Hypertension and makes him feel "spacey"   Amoxicillin Other (See Comments)    Other Reaction: ELEVATED BP   Hydrocodone Other (See Comments)    Causes headaches and makes the patient irritable   Varenicline Other (See Comments)    Suicidal thoughts   Wellbutrin [Bupropion]     Seizures    Other Anxiety    Other reaction(s): Other (See Comments) Patient stated that this keeps him awake; "has an opposite effect on me"   Trazodone And Nefazodone Anxiety and Other (See Comments)    Patient stated that this keeps him awake; "has an opposite effect on me"   Lab Results:  Results for orders placed or performed during the hospital encounter of 10/25/22 (from the past 48 hour(s))  Comprehensive metabolic panel     Status: Abnormal   Collection Time: 10/25/22  7:23 AM  Result Value Ref Range   Sodium 140 135 - 145 mmol/L   Potassium 4.1 3.5 - 5.1 mmol/L   Chloride 108 98 - 111 mmol/L   CO2 21 (L) 22 - 32 mmol/L   Glucose, Bld 145 (H) 70 - 99 mg/dL    Comment: Glucose reference range applies only to samples taken after fasting for at least 8 hours.   BUN 19 6 - 20 mg/dL   Creatinine, Ser 6.29 0.61 - 1.24 mg/dL   Calcium 9.3 8.9 - 52.8 mg/dL   Total Protein 7.2 6.5 - 8.1 g/dL   Albumin 4.3 3.5 - 5.0 g/dL   AST 18 15 - 41 U/L   ALT 20 0 - 44 U/L   Alkaline Phosphatase 99 38 - 126 U/L   Total Bilirubin 0.6 0.3 - 1.2 mg/dL   GFR, Estimated >41 >32 mL/min    Comment: (NOTE) Calculated using the CKD-EPI Creatinine Equation (2021)    Anion gap 11 5 - 15  Comment: Performed at [facility], [address] [place], [place] [address]  Salicylate level     Status: Abnormal   Collection Time: 10/25/22  7:23 AM   Result Value Ref Range   Salicylate Lvl <7.0 (L) 7.0 - 30.0 mg/dL    Comment: Performed at [facility], [address] [place], [place] [address]  Acetaminophen level     Status: Abnormal   Collection Time: 10/25/22  7:23 AM  Result Value Ref Range   Acetaminophen (Tylenol), Serum <10 (L) 10 - 30 ug/mL    Comment: (NOTE) Therapeutic concentrations vary significantly. A range of 10-30 ug/mL  may be an effective concentration for many patients. However, some  are best treated at concentrations outside of this range. Acetaminophen concentrations >150 ug/mL at 4 hours after ingestion  and >50 ug/mL at 12 hours after ingestion are often associated with  toxic reactions.  Performed at [facility], [address] [place], [place] [address]   cbc     Status: None   Collection Time: 10/25/22  7:23 AM  Result Value Ref Range   WBC 5.6 4.0 - 10.5 K/uL   RBC 4.99 4.22 - 5.81 MIL/uL   Hemoglobin 14.2 13.0 - 17.0 g/dL   HCT 21.3 08.6 - 57.8 %   MCV 89.0 80.0 - 100.0 fL   MCH 28.5 26.0 - 34.0 pg   MCHC 32.0 30.0 - 36.0 g/dL   RDW 46.9 62.9 - 52.8 %   Platelets 291 150 - 400 K/uL   nRBC 0.0 0.0 - 0.2 %    Comment: Performed at [facility], [address], [place], [place] [address]  Ethanol     Status: None   Collection Time: 10/25/22  7:23 AM  Result Value Ref Range   Alcohol, Ethyl (B) <10 <10 mg/dL    Comment: (NOTE) Lowest detectable limit for serum alcohol is 10 mg/dL.  For medical purposes only. Performed at [facility], [address] [place], [place] [address]   Urine Drug Screen, Qualitative     Status: Abnormal   Collection Time: 10/25/22  7:24 AM  Result Value Ref Range   Tricyclic, Ur Screen POSITIVE (A) NONE DETECTED   Amphetamines, Ur Screen NONE DETECTED NONE DETECTED   MDMA (Ecstasy)Ur Screen NONE DETECTED NONE DETECTED   Cocaine Metabolite,Ur [place] NONE DETECTED NONE DETECTED   Opiate, Ur Screen NONE DETECTED NONE  DETECTED   Phencyclidine (PCP) Ur S NONE DETECTED NONE DETECTED   Cannabinoid 50 Ng, Ur [place] NONE DETECTED NONE DETECTED   Barbiturates, Ur Screen NONE DETECTED NONE DETECTED   Benzodiazepine, Ur Scrn NONE DETECTED NONE DETECTED   Methadone Scn, Ur NONE DETECTED NONE DETECTED    Comment: (NOTE) Tricyclics + metabolites, urine    Cutoff 1000 ng/mL Amphetamines + metabolites, urine  Cutoff 1000 ng/mL MDMA (Ecstasy), urine              Cutoff 500 ng/mL Cocaine Metabolite, urine          Cutoff 300 ng/mL Opiate + metabolites, urine        Cutoff 300 ng/mL Phencyclidine (PCP), urine         Cutoff 25 ng/mL Cannabinoid, urine                 Cutoff 50 ng/mL Barbiturates + metabolites, urine  Cutoff 200 ng/mL Benzodiazepine, urine              Cutoff 200 ng/mL Methadone, urine  Cutoff 300 ng/mL  The urine drug screen provides only a preliminary, unconfirmed analytical test result and should not be used for non-medical purposes. Clinical consideration and professional judgment should be applied to any positive drug screen result due to possible interfering substances. A more specific alternate chemical method must be used in order to obtain a confirmed analytical result. Gas chromatography / mass spectrometry (GC/MS) is the preferred confirm atory method. Performed at [facility], [address], [place], [place] [address]     Blood Alcohol level:  Lab Results  Component Value Date   [facility] <10 10/25/2022   ETH <10 10/07/2022    Metabolic Disorder Labs:  Lab Results  Component Value Date   HGBA1C 7.4 (H) 05/18/2018   MPG 165.68 05/18/2018   MPG 151.33 03/31/2018   Lab Results  Component Value Date   PROLACTIN 18.1 (H) 09/12/2016   PROLACTIN 6.5 05/27/2016   Lab Results  Component Value Date   CHOL 106 02/25/2021   TRIG 213 (H) 02/25/2021   HDL 28 (L) 02/25/2021   CHOLHDL 3.8 02/25/2021   VLDL 43 (H) 02/25/2021   LDLCALC 35 02/25/2021   LDLCALC  UNABLE TO CALCULATE IF TRIGLYCERIDE OVER 400 mg/dL 62/13/0865    Current Medications: Current Facility-Administered Medications  Medication Dose Route Frequency Provider Last Rate Last Admin   acetaminophen (TYLENOL) tablet 650 mg  650 mg Oral Q6H PRN Bennett, Christal H, NP       alum & mag hydroxide-simeth (MAALOX/MYLANTA) 200-200-20 MG/5ML suspension 30 mL  30 mL Oral Q4H PRN Bennett, Christal H, NP       amLODipine (NORVASC) tablet 5 mg  5 mg Oral Daily Lauree Chandler, NP   5 mg at 10/26/22 1247   atorvastatin (LIPITOR) tablet 20 mg  20 mg Oral Daily Lauree Chandler, NP   20 mg at 10/26/22 1243   carvedilol (COREG) tablet 6.25 mg  6.25 mg Oral BID WC Lauree Chandler, NP       clopidogrel (PLAVIX) tablet 75 mg  75 mg Oral Daily Lauree Chandler, NP   75 mg at 10/26/22 1442   cyclobenzaprine (FLEXERIL) tablet 10 mg  10 mg Oral TID PRN Lauree Chandler, NP       diphenhydrAMINE (BENADRYL) capsule 50 mg  50 mg Oral TID PRN Willeen Cass, Christal H, NP   50 mg at 10/26/22 1238   Or   diphenhydrAMINE (BENADRYL) injection 50 mg  50 mg Intramuscular TID PRN Bennett, Christal H, NP       DULoxetine (CYMBALTA) DR capsule 20 mg  20 mg Oral BID Lauree Chandler, NP       feeding supplement (ENSURE ENLIVE / ENSURE PLUS) liquid 237 mL  237 mL Oral BID BM Sarina Ill, DO   237 mL at 10/26/22 1444   gabapentin (NEURONTIN) capsule 300 mg  300 mg Oral TID Lauree Chandler, NP   300 mg at 10/26/22 1242   [START ON 10/27/2022] glipiZIDE (GLUCOTROL XL) 24 hr tablet 10 mg  10 mg Oral Q breakfast Lauree Chandler, NP       haloperidol (HALDOL) tablet 5 mg  5 mg Oral TID PRN Willeen Cass, Christal H, NP   5 mg at 10/26/22 1239   Or   haloperidol lactate (HALDOL) injection 5 mg  5 mg Intramuscular TID PRN Bennett, Christal H, NP       hydrOXYzine (ATARAX) tablet 50 mg  50 mg Oral TID PRN Hampton Abbot, NP  magnesium hydroxide (MILK OF MAGNESIA) suspension 30 mL  30 mL Oral  Daily PRN Bennett, Christal H, NP       metFORMIN (GLUCOPHAGE) tablet 1,000 mg  1,000 mg Oral BID WC Lauree Chandler, NP       mirtazapine (REMERON) tablet 15 mg  15 mg Oral QHS Bennett, Christal H, NP   15 mg at 10/25/22 2152   multivitamin with minerals tablet 1 tablet  1 tablet Oral Daily Sarina Ill, DO   1 tablet at 10/26/22 1032   nicotine polacrilex (NICORETTE) gum 2 mg  2 mg Oral PRN Sarina Ill, DO       rOPINIRole (REQUIP) tablet 0.5 mg  0.5 mg Oral QHS Bennett, Christal H, NP   0.5 mg at 10/25/22 2152   PTA Medications: Medications Prior to Admission  Medication Sig Dispense Refill Last Dose   albuterol (VENTOLIN HFA) 108 (90 Base) MCG/ACT inhaler Inhale 2 puffs into the lungs every 6 (six) hours as needed for wheezing or shortness of breath.      amLODipine (NORVASC) 5 MG tablet Take 2 tablets (10 mg total) by mouth daily. (Patient taking differently: Take 5 mg by mouth daily.) 180 tablet 0    atorvastatin (LIPITOR) 20 MG tablet Take 20 mg by mouth daily.      carvedilol (COREG) 6.25 MG tablet Take 6.25 mg by mouth 2 (two) times daily with a meal.      clopidogrel (PLAVIX) 75 MG tablet Take 75 mg by mouth daily.      cyclobenzaprine (FLEXERIL) 10 MG tablet Take 10 mg by mouth 3 (three) times daily as needed for muscle spasms.      doxepin (SINEQUAN) 25 MG capsule Take 25 mg by mouth at bedtime.      DULoxetine (CYMBALTA) 60 MG capsule Take 60 mg by mouth at bedtime. (Patient not taking: Reported on 10/25/2022)      escitalopram (LEXAPRO) 10 MG tablet Take 1 tablet (10 mg total) by mouth daily. 30 tablet 2    glipiZIDE (GLUCOTROL XL) 10 MG 24 hr tablet Take 10 mg by mouth daily with breakfast.      LORazepam (ATIVAN) 1 MG tablet Take 1 tablet (1 mg total) by mouth 2 (two) times daily. (Patient taking differently: Take 0.5 mg by mouth 2 (two) times daily as needed for anxiety.) 2 tablet 0    metFORMIN (GLUCOPHAGE) 500 MG tablet Take 1,000 mg by mouth 2 (two)  times daily.      mirtazapine (REMERON) 15 MG tablet Take 15 mg by mouth at bedtime.      oxyCODONE-acetaminophen (PERCOCET) 7.5-325 MG tablet Take 1 tablet by mouth every 8 (eight) hours as needed for severe pain. (Patient not taking: Reported on 10/25/2022) 12 tablet 0    pantoprazole (PROTONIX) 40 MG tablet Take 40 mg by mouth 2 (two) times daily.      QUEtiapine (SEROQUEL) 400 MG tablet Take 2 tablets (800 mg total) by mouth at bedtime. 30 tablet 2    rOPINIRole (REQUIP) 0.5 MG tablet Take 0.5 mg by mouth at bedtime.      traZODone (DESYREL) 50 MG tablet Take 50 mg by mouth at bedtime.       Musculoskeletal: Strength & Muscle Tone: within normal limits Gait & Station: normal Patient leans: N/A            Psychiatric Specialty Exam:  Presentation  General Appearance:  Appropriate for Environment  Eye Contact: Good  Speech: Clear and Coherent;  Normal Rate  Speech Volume: Normal  Handedness: Right   Mood and Affect  Mood: Depressed; Dysphoric; Anxious  Affect: Congruent; Blunt   Thought Process  Thought Processes: Coherent; Goal Directed; Linear  Duration of Psychotic Symptoms: NA Past Diagnosis of Schizophrenia or Psychoactive disorder: No  Descriptions of Associations:Intact  Orientation:Full (Time, Place and Person)  Thought Content:Logical  Hallucinations:Hallucinations: None  Ideas of Reference:None  Suicidal Thoughts:Suicidal Thoughts: Yes, Passive  Homicidal Thoughts:Homicidal Thoughts: No   Sensorium  Memory: Immediate Good  Judgment: Intact  Insight: Present   Executive Functions  Concentration: Fair  Attention Span: Fair  Recall: Fair  Fund of Knowledge: Fair  Language: Fair   Psychomotor Activity  Psychomotor Activity: Psychomotor Activity: Other (comment); Restlessness (oral TD noted)   Assets  Assets: Communication Skills; Desire for Improvement; Financial Resources/Insurance; Housing; Leisure  Time; Resilience; Social Support   Sleep  Sleep: Sleep: Poor    Physical Exam: Physical Exam Constitutional:      General: He is not in acute distress.    Appearance: He is not ill-appearing, toxic-appearing or diaphoretic.  Eyes:     General: No scleral icterus. Cardiovascular:     Rate and Rhythm: Normal rate.  Pulmonary:     Effort: Pulmonary effort is normal. No respiratory distress.  Neurological:     Mental Status: He is alert and oriented to person, place, and time.  Psychiatric:        Attention and Perception: Attention and perception normal.        Mood and Affect: Mood is anxious and depressed. Affect is blunt.        Speech: Speech normal.        Behavior: Behavior normal. Behavior is cooperative.        Thought Content: Thought content includes suicidal ideation.        Cognition and Memory: Cognition and memory normal.    Review of Systems  Constitutional:  Negative for chills and fever.  Respiratory:  Negative for shortness of breath.   Cardiovascular:  Negative for chest pain and palpitations.  Gastrointestinal:  Negative for abdominal pain.  Neurological:  Negative for headaches.  Psychiatric/Behavioral:  Positive for depression and suicidal ideas. The patient is nervous/anxious.    Blood pressure 125/78, pulse 79, temperature 97.8 F (36.6 C), temperature source Oral, resp. rate 18, height 6\' 2"  (1.88 m), weight 83 kg, SpO2 99%. Body mass index is 23.5 kg/m.  Treatment Plan Summary: Daily contact with patient to assess and evaluate symptoms and progress in treatment, Medication management, and Plan    Cymbalta 20mg  BID for depression, anxiety and chronic pain Gabapentin 300mg  TID for anxiety, chronic pain  Observation Level/Precautions:  15 minute checks  Laboratory:   no new labs  Psychotherapy:    Medications:    Consultations:    Discharge Concerns:    Estimated LOS:  Other:     Physician Treatment Plan for Primary Diagnosis: Major  depressive disorder, recurrent episode, severe (HCC) Long Term Goal(s): Improvement in symptoms so as ready for discharge  Short Term Goals: Ability to identify changes in lifestyle to reduce recurrence of condition will improve, Ability to verbalize feelings will improve, Ability to demonstrate self-control will improve, and Ability to identify triggers associated with substance abuse/mental health issues will improve  Physician Treatment Plan for Secondary Diagnosis: Principal Problem:   Major depressive disorder, recurrent episode, severe (HCC)  Long Term Goal(s): Improvement in symptoms so as ready for discharge  Short Term Goals: Ability to identify  changes in lifestyle to reduce recurrence of condition will improve, Ability to verbalize feelings will improve, Ability to demonstrate self-control will improve, and Ability to identify triggers associated with substance abuse/mental health issues will improve  I certify that inpatient services furnished can reasonably be expected to improve the patient's condition.    Lauree Chandler, NP 8/1/20243:35 PM

## 2022-10-26 NOTE — Progress Notes (Signed)
Suicide Risk Assessment  Admission Assessment    [facility] Admission Suicide Risk Assessment   Nursing information obtained from:  Patient Demographic factors:  Male, Caucasian, Low socioeconomic status Current Mental Status:  Suicidal ideation indicated by patient, Self-harm behaviors Loss Factors:  NA, Financial problems / change in socioeconomic status Historical Factors:  Prior suicide attempts Risk Reduction Factors:  Sense of responsibility to family, Living with another person, especially a relative  Total Time spent with patient: 1 hour Principal Problem: Major depressive disorder, recurrent episode, severe (HCC) Diagnosis:  Principal Problem:   Major depressive disorder, recurrent episode, severe (HCC)  Subjective Data: 52 y/o male admitted after initial presentation to the ED reporting suicide attempt.  Continued Clinical Symptoms:  Alcohol Use Disorder Identification Test Final Score (AUDIT): 1 The "Alcohol Use Disorders Identification Test", Guidelines for Use in Primary Care, Second Edition.  World Science writer [facility]). Score between 0-7:  no or low risk or alcohol related problems. Score between 8-15:  moderate risk of alcohol related problems. Score between 16-19:  high risk of alcohol related problems. Score 20 or above:  warrants further diagnostic evaluation for alcohol dependence and treatment.   CLINICAL FACTORS:   Severe Anxiety and/or Agitation Depression:   Insomnia Previous Psychiatric Diagnoses and Treatments   Musculoskeletal: Strength & Muscle Tone: within normal limits Gait & Station: normal Patient leans: N/A  Psychiatric Specialty Exam:  Presentation  General Appearance:  Appropriate for Environment  Eye Contact: Good  Speech: Clear and Coherent; Normal Rate  Speech Volume: Normal  Handedness: Right   Mood and Affect  Mood: Depressed; Dysphoric; Anxious  Affect: Congruent; Blunt   Thought Process  Thought  Processes: Coherent; Goal Directed; Linear  Descriptions of Associations:Intact  Orientation:Full (Time, Place and Person)  Thought Content:Logical  History of Schizophrenia/Schizoaffective disorder:No  Duration of Psychotic Symptoms:No data recorded Hallucinations:Hallucinations: None  Ideas of Reference:None  Suicidal Thoughts:Suicidal Thoughts: Yes, Passive SI Active Intent and/or Plan: With Intent; With Plan  Homicidal Thoughts:Homicidal Thoughts: No   Sensorium  Memory: Immediate Good  Judgment: Intact  Insight: Present   Executive Functions  Concentration: Fair  Attention Span: Fair  Recall: [employer] of Knowledge: Fair  Language: Fair   Psychomotor Activity  Psychomotor Activity: Psychomotor Activity: Other (comment); Restlessness (oral TD noted)   Assets  Assets: Communication Skills; Desire for Improvement; Financial Resources/Insurance; Housing; Leisure Time; Resilience; Social Support   Sleep  Sleep: Sleep: Poor    Physical Exam: Physical Exam see admission ROS see admission Blood pressure 125/78, pulse 79, temperature 97.8 F (36.6 C), temperature source Oral, resp. rate 18, height 6\' 2"  (1.88 m), weight 83 kg, SpO2 99%. Body mass index is 23.5 kg/m.   COGNITIVE FEATURES THAT CONTRIBUTE TO RISK:  None    SUICIDE RISK:   Moderate:  Frequent suicidal ideation with limited intensity, and duration, some specificity in terms of plans, no associated intent, good self-control, limited dysphoria/symptomatology, some risk factors present, and identifiable protective factors, including available and accessible social support.  PLAN OF CARE:  Daily contact with patient to assess and evaluate symptoms and progress in treatment Medication management  I certify that inpatient services furnished can reasonably be expected to improve the patient's condition.   Lauree Chandler, NP 10/26/2022, 3:42 PM

## 2022-10-26 NOTE — Plan of Care (Signed)
  Problem: Safety: Goal: Ability to remain free from injury will improve Outcome: Progressing   

## 2022-10-27 DIAGNOSIS — F332 Major depressive disorder, recurrent severe without psychotic features: Secondary | ICD-10-CM | POA: Diagnosis not present

## 2022-10-27 MED ORDER — QUETIAPINE FUMARATE 200 MG PO TABS
200.0000 mg | ORAL_TABLET | Freq: Every day | ORAL | Status: DC
  Administered 2022-10-27: 200 mg via ORAL
  Filled 2022-10-27: qty 1

## 2022-10-27 MED ORDER — OLANZAPINE 10 MG IM SOLR
10.0000 mg | Freq: Once | INTRAMUSCULAR | Status: AC
  Administered 2022-10-27: 10 mg via INTRAMUSCULAR
  Filled 2022-10-27: qty 10

## 2022-10-27 MED ORDER — LITHIUM CARBONATE ER 300 MG PO TBCR
300.0000 mg | EXTENDED_RELEASE_TABLET | ORAL | Status: DC
  Administered 2022-10-27 – 2022-10-29 (×4): 300 mg via ORAL
  Filled 2022-10-27 (×4): qty 1

## 2022-10-27 MED ORDER — DULOXETINE HCL 30 MG PO CPEP
30.0000 mg | ORAL_CAPSULE | Freq: Two times a day (BID) | ORAL | Status: DC
  Administered 2022-10-27 – 2022-11-01 (×10): 30 mg via ORAL
  Filled 2022-10-27 (×10): qty 1

## 2022-10-27 NOTE — Group Note (Signed)
Recreation Therapy Group Note   Group Topic:Leisure Education  Group Date: 10/27/2022 Start Time: 1030 End Time: 1130 Facilitators: Rosina Lowenstein, LRT, CTRS Location:  Craft Room  Group Description: Leisure. Patients were given the option to choose from coloring, singing karaoke, making origami, or playing cards. LRT and pts discussed the meaning of leisure, the importance of participating in leisure during their free time/when they're outside of the hospital, as well as how our leisure interests can also serve as coping skills. Pt identified two leisure interests and shared with the group.   Goal Area(s) Addressed:  Patient will identify a current leisure interest.  Patient will learn the definition of "leisure". Patient will practice making a positive decision. Patient will have the opportunity to try a new leisure activity. Patient will communicate with peers and LRT.    Affect/Mood: N/A   Participation Level: Did not attend    Clinical Observations/Individualized Feedback: Michael Schmitt did not attend group.  Plan: Continue to engage patient in RT group sessions 2-3x/week.   Rosina Lowenstein, LRT, CTRS 10/27/2022 12:36 PM

## 2022-10-27 NOTE — [facility] Suicide Risk Assessment (Signed)
[facility] INPATIENT:  Family/Significant Other Suicide Prevention Education  Suicide Prevention Education:  Education Completed; Iann Rodier, sister, [phone],  (name of family member/significant other) has been identified by the patient as the family member/significant other with whom the patient will be residing, and identified as the person(s) who will aid the patient in the event of a mental health crisis (suicidal ideations/suicide attempt).  With written consent from the patient, the family member/significant other has been provided the following suicide prevention education, prior to the and/or following the discharge of the patient.  The suicide prevention education provided includes the following: Suicide risk factors Suicide prevention and interventions National Suicide Hotline telephone number [facility] assessment telephone number [facility] Emergency Assistance 911 [facility] and/or Residential Mobile Crisis Unit telephone number  Request made of family/significant other to: Remove weapons (e.g., guns, rifles, knives), all items previously/currently identified as safety concern.   Remove drugs/medications (over-the-counter, prescriptions, illicit drugs), all items previously/currently identified as a safety concern.  The family member/significant other verbalizes understanding of the suicide prevention education information provided.  The family member/significant other agrees to remove the items of safety concern listed above.   Sister reports that "he's been depressed".  She reports that she is unsure of the circumstances to the patient's admission, stating that she "all I know is that I was woken up and asked to call 911".  She reports that the patient has a history of suicidal comments and gestures, she reports that she is unsure if patient could be a danger to self or others.  She reports that patient does not have access to weapons.    Harden Mo 10/27/2022, 1:02 PM

## 2022-10-27 NOTE — Progress Notes (Signed)
   10/27/22 9629  Psych Admission Type (Psych Patients Only)  Admission Status Voluntary  Psychosocial Assessment  Patient Complaints Anxiety;Depression;Crying spells  Eye Contact Fair  Facial Expression Angry;Sad  Affect Angry;Anxious  Speech Logical/coherent  Interaction Demanding;Manipulative  Motor Activity Fidgety;Slow  Appearance/Hygiene In scrubs  Behavior Characteristics Anxious;Irritable  Mood Depressed;Anxious  Thought Process  Coherency WDL  Content Blaming others;Preoccupation  Delusions None reported or observed  Perception WDL  Hallucination None reported or observed  Judgment WDL  Confusion WDL  Danger to Self  Current suicidal ideation? Passive  Agreement Not to Harm Self Yes  Description of Agreement Verbal  Danger to Others  Danger to Others None reported or observed   Patient is alert and oriented X4.  Patient states he is having SI. Patient denies HI/AVH.  Patient with statements concerning anxiety and pain.  Patient medicated with PRN meds as ordered.  1420 Patient stated to tech that he was having chest pain.  When this nurse saw the patient he states the pain was in his head and neck and he needed stronger pain meds than he has been given.  Patient refused VS assessment and EKG.  Patient given heat pack and more Tylenol.  Then given oral Benadryl and Haldol.  Incomplete relief.  Patient remains on Q 15 minute checks.

## 2022-10-27 NOTE — Plan of Care (Signed)
  Problem: Education: Goal: Knowledge of [facility] Education information/materials will improve Outcome: Progressing   Problem: Activity: Goal: Interest or engagement in activities will improve Outcome: Progressing   

## 2022-10-27 NOTE — Progress Notes (Signed)
[facility] MD Progress Note  10/27/2022 2:17 PM Michael Schmitt  MRN:  [id number] Subjective: Michael Schmitt is seen on rounds.  He is very depressed and having suicidal thoughts.  He complains of not sleeping.  He is very cluster B and wrapped a cord around his neck in the emergency room.  He is looking for benzodiazepines.  I talked to him about lithium and he thought that might be a good idea for his suicidality. Principal Problem: Major depressive disorder, recurrent episode, severe (HCC) Diagnosis: Principal Problem:   Major depressive disorder, recurrent episode, severe (HCC)  Total Time spent with patient: 15 minutes  Past Psychiatric History: Extensive history of depression and polysubstance abuse  Past Medical History:  Past Medical History:  Diagnosis Date   Anxiety    Anxiety    Arthritis    cerv. stenosis, spondylosis, HNP- lower back , has been followed in pain clinic, has  had injection s in cerv. 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 Cervical three-four.   CARDIAC CATHETERIZATION N/A 01/01/2015   Procedure: Left Heart Cath and Coronary Angiography;  Surgeon: Rinaldo Cloud, MD;  Location: [facility] INVASIVE CV LAB;  Service: Cardiovascular;  Laterality: N/A;   CARDIAC CATHETERIZATION N/A 04/05/2016   Procedure: Left Heart Cath and Coronary Angiography;  Surgeon: Lyn Records, MD;  Location: [facility] INVASIVE CV LAB;  Service: Cardiovascular;  Laterality: N/A;   CARDIAC CATHETERIZATION N/A 04/05/2016   Procedure: Coronary Stent Intervention;  Surgeon: Lyn Records, MD;  Location: [facility] INVASIVE CV LAB;  Service: Cardiovascular;  Laterality: N/A;   CARDIAC CATHETERIZATION N/A 04/05/2016   Procedure: Intravascular Ultrasound/IVUS;  Surgeon: Lyn Records, MD;  Location: [facility] INVASIVE CV LAB;  Service: Cardiovascular;  Laterality: N/A;   NASAL SINUS SURGERY     2005   Family History:  Family History  Problem Relation Age of Onset   Prostate cancer Father    Hypertension Mother    Kidney Stones Mother    Anxiety disorder Mother    Depression Mother    COPD Sister    Hypertension Sister    Diabetes Sister    Depression Sister    Anxiety disorder Sister    Seizures Sister    ADD / ADHD Son    ADD / ADHD Daughter    Family Psychiatric  History: Unremarkable Social History:  Social History   Substance and Sexual Activity  Alcohol Use No  Alcohol/week: 0.0 standard drinks of alcohol   Comment: last drinked in 3 months.      Social History   Substance and Sexual Activity  Drug Use No    Social History   Socioeconomic History   Marital status: Widowed    Spouse name: Not on file   Number of children: 3   Years of education: 81   Highest education level: Not on file  Occupational History    Comment: unemployed  Tobacco Use   Smoking status: Every Day    Current packs/day: 2.00    Average packs/day: 2.0 packs/day for 17.0 years (34.0 ttl pk-yrs)    Types: Cigarettes, Cigars   Smokeless  tobacco: Former   Tobacco comments:    occassional snuff   Vaping Use   Vaping status: Never Used  Substance and Sexual Activity   Alcohol use: No    Alcohol/week: 0.0 standard drinks of alcohol    Comment: last drinked in 3 months.    Drug use: No   Sexual activity: Not Currently  Other Topics Concern   Not on file  Social History Narrative   Patient is right handed.   Patient drinks 2 sodas daily.   Social Determinants of Health   Financial Resource Strain: Medium Risk (08/22/2022)   Received from [facility]   Overall Financial Resource Strain (CARDIA)    Difficulty of Paying Living Expenses: Somewhat hard  Food Insecurity: Patient Declined (10/25/2022)   Hunger Vital Sign    Worried About Running Out of Food in the Last Year: Patient declined    Ran Out of Food in the Last Year: Patient declined  Transportation Needs: No Transportation Needs (10/25/2022)   PRAPARE - Administrator, Civil Service (Medical): No    Lack of Transportation (Non-Medical): No  Physical Activity: Not on file  Stress: Not on file  Social Connections: Not on file   Additional Social History:                         Sleep: Good  Appetite:  Good  Current Medications: Current Facility-Administered Medications  Medication Dose Route Frequency Provider Last Rate Last Admin   acetaminophen (TYLENOL) tablet 650 mg  650 mg Oral Q6H PRN Bennett, Christal H, NP   650 mg at 10/27/22 0804   alum & mag hydroxide-simeth (MAALOX/MYLANTA) 200-200-20 MG/5ML suspension 30 mL  30 mL Oral Q4H PRN Bennett, Christal H, NP       amLODipine (NORVASC) tablet 5 mg  5 mg Oral Daily Lauree Chandler, NP   5 mg at 10/27/22 0805   atorvastatin (LIPITOR) tablet 20 mg  20 mg Oral Daily Lauree Chandler, NP   20 mg at 10/27/22 0801   carvedilol (COREG) tablet 6.25 mg  6.25 mg Oral BID WC Lauree Chandler, NP   6.25 mg at 10/27/22 0801   clopidogrel (PLAVIX) tablet 75 mg  75  mg Oral Daily Lauree Chandler, NP   75 mg at 10/27/22 0801   cyclobenzaprine (FLEXERIL) tablet 10 mg  10 mg Oral TID PRN Lauree Chandler, NP   10 mg at 10/27/22 1325   diphenhydrAMINE (BENADRYL) capsule 50 mg  50 mg Oral TID PRN Thurston Hole H, NP   50 mg at 10/26/22 2254   Or   diphenhydrAMINE (BENADRYL) injection 50 mg  50 mg Intramuscular TID PRN Willeen Cass, Christal H, NP   50 mg at 10/27/22  6213   DULoxetine (CYMBALTA) DR capsule 30 mg  30 mg Oral BID Sarina Ill, DO       feeding supplement (ENSURE ENLIVE / ENSURE PLUS) liquid 237 mL  237 mL Oral BID BM Sarina Ill, DO   237 mL at 10/27/22 0940   gabapentin (NEURONTIN) capsule 300 mg  300 mg Oral TID Lauree Chandler, NP   300 mg at 10/27/22 1202   glipiZIDE (GLUCOTROL XL) 24 hr tablet 10 mg  10 mg Oral Q breakfast Lauree Chandler, NP   10 mg at 10/27/22 0805   haloperidol (HALDOL) tablet 5 mg  5 mg Oral TID PRN Thurston Hole H, NP   5 mg at 10/26/22 2254   Or   haloperidol lactate (HALDOL) injection 5 mg  5 mg Intramuscular TID PRN Thurston Hole H, NP   5 mg at 10/27/22 0940   hydrOXYzine (ATARAX) tablet 50 mg  50 mg Oral TID PRN Thurston Hole H, NP   50 mg at 10/27/22 1325   lithium carbonate (LITHOBID) ER tablet 300 mg  300 mg Oral [id number] ,  Ramon Dredge, DO       magnesium hydroxide (MILK OF MAGNESIA) suspension 30 mL  30 mL Oral Daily PRN Bennett, Christal H, NP       metFORMIN (GLUCOPHAGE) tablet 1,000 mg  1,000 mg Oral BID WC Lauree Chandler, NP   1,000 mg at 10/27/22 0801   multivitamin with minerals tablet 1 tablet  1 tablet Oral Daily Sarina Ill, DO   1 tablet at 10/27/22 0800   nicotine (NICODERM CQ - dosed in mg/24 hours) patch 21 mg  21 mg Transdermal Daily Lauree Chandler, NP       nicotine polacrilex (NICORETTE) gum 2 mg  2 mg Oral PRN Sarina Ill, DO   2 mg at 10/26/22 1622   QUEtiapine (SEROQUEL) tablet 200 mg  200 mg Oral QHS  Sarina Ill, DO       rOPINIRole (REQUIP) tablet 0.5 mg  0.5 mg Oral QHS Bennett, Christal H, NP   0.5 mg at 10/26/22 2255    Lab Results: No results found for this or any previous visit (from the past 48 hour(s)).  Blood Alcohol level:  Lab Results  Component Value Date   ETH <10 10/25/2022   ETH <10 10/07/2022    Metabolic Disorder Labs: Lab Results  Component Value Date   HGBA1C 7.4 (H) 05/18/2018   MPG 165.68 05/18/2018   MPG 151.33 03/31/2018   Lab Results  Component Value Date   PROLACTIN 18.1 (H) 09/12/2016   PROLACTIN 6.5 05/27/2016   Lab Results  Component Value Date   CHOL 106 02/25/2021   TRIG 213 (H) 02/25/2021   HDL 28 (L) 02/25/2021   CHOLHDL 3.8 02/25/2021   VLDL 43 (H) 02/25/2021   LDLCALC 35 02/25/2021   LDLCALC UNABLE TO CALCULATE IF TRIGLYCERIDE OVER 400 mg/dL 08/65/7846    Physical Findings: AIMS:  , ,  ,  ,    CIWA:    COWS:     Musculoskeletal: Strength & Muscle Tone: within normal limits Gait & Station: normal Patient leans: N/A  Psychiatric Specialty Exam:  Presentation  General Appearance:  Appropriate for Environment  Eye Contact: Good  Speech: Clear and Coherent; Normal Rate  Speech Volume: Normal  Handedness: Right   Mood and Affect  Mood: Depressed; Dysphoric; Anxious  Affect: Congruent; Blunt   Thought Process  Thought Processes: Coherent; Goal Directed;  Linear  Descriptions of Associations:Intact  Orientation:Full (Time, Place and Person)  Thought Content:Logical  History of Schizophrenia/Schizoaffective disorder:No  Duration of Psychotic Symptoms:No data recorded Hallucinations:Hallucinations: None  Ideas of Reference:None  Suicidal Thoughts:Suicidal Thoughts: Yes, Passive  Homicidal Thoughts:Homicidal Thoughts: No   Sensorium  Memory: Immediate Good  Judgment: Intact  Insight: Present   Executive Functions  Concentration: Fair  Attention  Span: Fair  Recall: Fair  Fund of Knowledge: Fair  Language: Fair   Psychomotor Activity  Psychomotor Activity: Psychomotor Activity: Other (comment); Restlessness (oral TD noted)   Assets  Assets: Communication Skills; Desire for Improvement; Financial Resources/Insurance; Housing; Leisure Time; Resilience; Social Support   Sleep  Sleep: Sleep: Poor     Blood pressure (!) 117/90, pulse 75, temperature 98 F (36.7 C), temperature source Oral, resp. rate 19, height 6\' 2"  (1.88 m), weight 83 kg, SpO2 98%. Body mass index is 23.5 kg/m.   Treatment Plan Summary: Daily contact with patient to assess and evaluate symptoms and progress in treatment, Medication management, and Plan start lithium ER 300 twice a day, Seroquel 200 at bedtime discontinue Neurontin and increase Cymbalta to 30 mg/day.  Sarina Ill, DO 10/27/2022, 2:17 PM
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
## 2022-10-27 NOTE — BH IP Treatment Plan (Signed)
Interdisciplinary Treatment and Diagnostic Plan Update  10/27/2022 Time of Session: 9:31 AM  Michael Schmitt MRN: [id number]  Principal Diagnosis: Major depressive disorder, recurrent episode, severe (HCC)  Secondary Diagnoses: Principal Problem:   Major depressive disorder, recurrent episode, severe (HCC)   Current Medications:  Current Facility-Administered Medications  Medication Dose Route Frequency Provider Last Rate Last Admin   acetaminophen (TYLENOL) tablet 650 mg  650 mg Oral Q6H PRN Bennett, Christal H, NP   650 mg at 10/27/22 0804   alum & mag hydroxide-simeth (MAALOX/MYLANTA) 200-200-20 MG/5ML suspension 30 mL  30 mL Oral Q4H PRN Bennett, Christal H, NP       amLODipine (NORVASC) tablet 5 mg  5 mg Oral Daily Lauree Chandler, NP   5 mg at 10/27/22 0805   atorvastatin (LIPITOR) tablet 20 mg  20 mg Oral Daily Lauree Chandler, NP   20 mg at 10/27/22 0801   carvedilol (COREG) tablet 6.25 mg  6.25 mg Oral BID WC Lauree Chandler, NP   6.25 mg at 10/27/22 0801   clopidogrel (PLAVIX) tablet 75 mg  75 mg Oral Daily Lauree Chandler, NP   75 mg at 10/27/22 0801   cyclobenzaprine (FLEXERIL) tablet 10 mg  10 mg Oral TID PRN Lauree Chandler, NP   10 mg at 10/26/22 2254   diphenhydrAMINE (BENADRYL) capsule 50 mg  50 mg Oral TID PRN Thurston Hole H, NP   50 mg at 10/26/22 2254   Or   diphenhydrAMINE (BENADRYL) injection 50 mg  50 mg Intramuscular TID PRN Willeen Cass, Christal H, NP   50 mg at 10/27/22 0939   DULoxetine (CYMBALTA) DR capsule 20 mg  20 mg Oral BID Lauree Chandler, NP   20 mg at 10/27/22 0802   feeding supplement (ENSURE ENLIVE / ENSURE PLUS) liquid 237 mL  237 mL Oral BID BM Sarina Ill, DO   237 mL at 10/27/22 0940   gabapentin (NEURONTIN) capsule 300 mg  300 mg Oral TID Lauree Chandler, NP   300 mg at 10/27/22 0803   glipiZIDE (GLUCOTROL XL) 24 hr tablet 10 mg  10 mg Oral Q breakfast Lauree Chandler, NP   10 mg at 10/27/22 0805    haloperidol (HALDOL) tablet 5 mg  5 mg Oral TID PRN Thurston Hole H, NP   5 mg at 10/26/22 2254   Or   haloperidol lactate (HALDOL) injection 5 mg  5 mg Intramuscular TID PRN Thurston Hole H, NP   5 mg at 10/27/22 0940   hydrOXYzine (ATARAX) tablet 50 mg  50 mg Oral TID PRN Thurston Hole H, NP   50 mg at 10/27/22 0425   magnesium hydroxide (MILK OF MAGNESIA) suspension 30 mL  30 mL Oral Daily PRN Bennett, Christal H, NP       metFORMIN (GLUCOPHAGE) tablet 1,000 mg  1,000 mg Oral BID WC Lauree Chandler, NP   1,000 mg at 10/27/22 0801   mirtazapine (REMERON) tablet 15 mg  15 mg Oral QHS Bennett, Christal H, NP   15 mg at 10/26/22 2254   multivitamin with minerals tablet 1 tablet  1 tablet Oral Daily Sarina Ill, DO   1 tablet at 10/27/22 0800   nicotine (NICODERM CQ - dosed in mg/24 hours) patch 21 mg  21 mg Transdermal Daily Lauree Chandler, NP       nicotine polacrilex (NICORETTE) gum 2 mg  2 mg Oral PRN Sarina Ill, DO   2 mg  at 10/26/22 1622   rOPINIRole (REQUIP) tablet 0.5 mg  0.5 mg Oral QHS Bennett, Christal H, NP   0.5 mg at 10/26/22 2255   PTA Medications: Medications Prior to Admission  Medication Sig Dispense Refill Last Dose   albuterol (VENTOLIN HFA) 108 (90 Base) MCG/ACT inhaler Inhale 2 puffs into the lungs every 6 (six) hours as needed for wheezing or shortness of breath.      amLODipine (NORVASC) 5 MG tablet Take 2 tablets (10 mg total) by mouth daily. (Patient taking differently: Take 5 mg by mouth daily.) 180 tablet 0    atorvastatin (LIPITOR) 20 MG tablet Take 20 mg by mouth daily.      carvedilol (COREG) 6.25 MG tablet Take 6.25 mg by mouth 2 (two) times daily with a meal.      clopidogrel (PLAVIX) 75 MG tablet Take 75 mg by mouth daily.      cyclobenzaprine (FLEXERIL) 10 MG tablet Take 10 mg by mouth 3 (three) times daily as needed for muscle spasms.      doxepin (SINEQUAN) 25 MG capsule Take 25 mg by mouth at bedtime.       DULoxetine (CYMBALTA) 60 MG capsule Take 60 mg by mouth at bedtime. (Patient not taking: Reported on 10/25/2022)      escitalopram (LEXAPRO) 10 MG tablet Take 1 tablet (10 mg total) by mouth daily. 30 tablet 2    glipiZIDE (GLUCOTROL XL) 10 MG 24 hr tablet Take 10 mg by mouth daily with breakfast.      LORazepam (ATIVAN) 1 MG tablet Take 1 tablet (1 mg total) by mouth 2 (two) times daily. (Patient taking differently: Take 0.5 mg by mouth 2 (two) times daily as needed for anxiety.) 2 tablet 0    metFORMIN (GLUCOPHAGE) 500 MG tablet Take 1,000 mg by mouth 2 (two) times daily.      mirtazapine (REMERON) 15 MG tablet Take 15 mg by mouth at bedtime.      oxyCODONE-acetaminophen (PERCOCET) 7.5-325 MG tablet Take 1 tablet by mouth every 8 (eight) hours as needed for severe pain. (Patient not taking: Reported on 10/25/2022) 12 tablet 0    pantoprazole (PROTONIX) 40 MG tablet Take 40 mg by mouth 2 (two) times daily.      QUEtiapine (SEROQUEL) 400 MG tablet Take 2 tablets (800 mg total) by mouth at bedtime. 30 tablet 2    rOPINIRole (REQUIP) 0.5 MG tablet Take 0.5 mg by mouth at bedtime.      traZODone (DESYREL) 50 MG tablet Take 50 mg by mouth at bedtime.       Patient Stressors:    Patient Strengths:    Treatment Modalities: Medication Management, Group therapy, Case management,  1 to 1 session with clinician, Psychoeducation, Recreational therapy.   Physician Treatment Plan for Primary Diagnosis: Major depressive disorder, recurrent episode, severe (HCC) Long Term Goal(s): Improvement in symptoms so as ready for discharge   Short Term Goals: Ability to identify changes in lifestyle to reduce recurrence of condition will improve Ability to verbalize feelings will improve Ability to demonstrate self-control will improve Ability to identify triggers associated with substance abuse/mental health issues will improve  Medication Management: Evaluate patient's response, side effects, and tolerance of  medication regimen.  Therapeutic Interventions: 1 to 1 sessions, Unit Group sessions and Medication administration.  Evaluation of Outcomes: Progressing  Physician Treatment Plan for Secondary Diagnosis: Principal Problem:   Major depressive disorder, recurrent episode, severe (HCC)  Long Term Goal(s): Improvement in symptoms so as ready  for discharge   Short Term Goals: Ability to identify changes in lifestyle to reduce recurrence of condition will improve Ability to verbalize feelings will improve Ability to demonstrate self-control will improve Ability to identify triggers associated with substance abuse/mental health issues will improve     Medication Management: Evaluate patient's response, side effects, and tolerance of medication regimen.  Therapeutic Interventions: 1 to 1 sessions, Unit Group sessions and Medication administration.  Evaluation of Outcomes: Progressing   RN Treatment Plan for Primary Diagnosis: Major depressive disorder, recurrent episode, severe (HCC) Long Term Goal(s): Knowledge of disease and therapeutic regimen to maintain health will improve  Short Term Goals: Ability to remain free from injury will improve, Ability to verbalize frustration and anger appropriately will improve, Ability to demonstrate self-control, Ability to participate in decision making will improve, Ability to verbalize feelings will improve, Ability to disclose and discuss suicidal ideas, Ability to identify and develop effective coping behaviors will improve, and Compliance with prescribed medications will improve  Medication Management: RN will administer medications as ordered by provider, will assess and evaluate patient's response and provide education to patient for prescribed medication. RN will report any adverse and/or side effects to prescribing provider.  Therapeutic Interventions: 1 on 1 counseling sessions, Psychoeducation, Medication administration, Evaluate responses to  treatment, Monitor vital signs and CBGs as ordered, Perform/monitor CIWA, COWS, AIMS and Fall Risk screenings as ordered, Perform wound care treatments as ordered.  Evaluation of Outcomes: Progressing   LCSW Treatment Plan for Primary Diagnosis: Major depressive disorder, recurrent episode, severe (HCC) Long Term Goal(s): Safe transition to appropriate next level of care at discharge, Engage patient in therapeutic group addressing interpersonal concerns.  Short Term Goals: Engage patient in aftercare planning with referrals and resources, Increase social support, Increase ability to appropriately verbalize feelings, Increase emotional regulation, Facilitate acceptance of mental health diagnosis and concerns, and Increase skills for wellness and recovery  Therapeutic Interventions: Assess for all discharge needs, 1 to 1 time with Social worker, Explore available resources and support systems, Assess for adequacy in community support network, Educate family and significant other(s) on suicide prevention, Complete Psychosocial Assessment, Interpersonal group therapy.  Evaluation of Outcomes: Progressing   Progress in Treatment: Attending groups: Yes. Participating in groups: Yes. Taking medication as prescribed: Yes. Toleration medication: Yes. Family/Significant other contact made: No, will contact:  CSW will contact if given permission  Patient understands diagnosis: Yes. Discussing patient identified problems/goals with staff: Yes. Medical problems stabilized or resolved: Yes. Denies suicidal/homicidal ideation: Yes. Issues/concerns per patient self-inventory: No. Other: None   New problem(s) identified: No, Describe:  None   New Short Term/Long Term Goal(s):  elimination of symptoms of psychosis, medication management for mood stabilization; elimination of SI thoughts; development of comprehensive mental wellness plan.   Patient Goals:  "To get better"  Discharge Plan or Barriers:  CSW will assist with appropriate discharge planning   Reason for Continuation of Hospitalization: Depression Medication stabilization  Estimated Length of Stay:  Last 3 [place] Suicide Severity Risk Score: Flowsheet Row Admission (Current) from 10/25/2022 in [facility] INPATIENT BEHAVIORAL MEDICINE Most recent reading at 10/25/2022 11:00 PM ED from 10/25/2022 in [facility] Emergency Department at [facility] Most recent reading at 10/25/2022  7:22 AM ED from 10/08/2022 in [facility] Emergency Department at [facility] Most recent reading at 10/07/2022 11:51 PM  C-SSRS RISK CATEGORY High Risk High Risk Low Risk       Last PHQ 2/9 Scores:    11/22/2016   10:53 AM  04/20/2016    3:56 PM  Depression screen PHQ 2/9  Decreased Interest 0 3  Down, Depressed, Hopeless 0 3  PHQ - 2 Score 0 6  Altered sleeping  3  Tired, decreased energy  3  Change in appetite  0  Feeling bad or failure about yourself   3  Trouble concentrating  3  Moving slowly or fidgety/restless  3  Suicidal thoughts  3  PHQ-9 Score  24  Difficult doing work/chores  Extremely dIfficult    Scribe for Treatment Team: Elza Rafter, Theresia Majors 10/27/2022 11:56 AM
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
## 2022-10-27 NOTE — Progress Notes (Signed)
Patient yelling down hall, Help. Frequenting nurses station holding head and twisting neck stating "I can't take this" Please call and get some help. "I need something, I can't take it. Writer continues to re-direct patient. Patient increasingly agitated.

## 2022-10-27 NOTE — Progress Notes (Signed)
Patient continues to yell out in night. Requests prn to help with anxiety and agitation. Patient states he gets really agitated when he can't sleep. Patient reports medications provided did not help. Writer reached out to on call NP, declined giving any medications. Patient did sleep a good portion of shift but is visibly agitated.

## 2022-10-27 NOTE — Progress Notes (Signed)
Patient yelling in hall and twisting his neck and hitting himself in the head. Offered IM injection for anxiety and behavior- he stated "give me anything". Benadryl 50 mg IM right deltoid and Haldol 5 mg administered left deltoid. Follow up ,he was in his room lying in bed with curtains opened looking outside quietly. States he feels "better".

## 2022-10-28 DIAGNOSIS — F332 Major depressive disorder, recurrent severe without psychotic features: Secondary | ICD-10-CM | POA: Diagnosis not present

## 2022-10-28 MED ORDER — GABAPENTIN 300 MG PO CAPS
600.0000 mg | ORAL_CAPSULE | Freq: Three times a day (TID) | ORAL | Status: DC
  Administered 2022-10-28 – 2022-11-01 (×12): 600 mg via ORAL
  Filled 2022-10-28 (×13): qty 2

## 2022-10-28 MED ORDER — QUETIAPINE FUMARATE 200 MG PO TABS
800.0000 mg | ORAL_TABLET | Freq: Every day | ORAL | Status: DC
  Administered 2022-10-28 – 2022-10-31 (×4): 800 mg via ORAL
  Filled 2022-10-28 (×4): qty 4

## 2022-10-28 MED ORDER — MELOXICAM 7.5 MG PO TABS
7.5000 mg | ORAL_TABLET | Freq: Every day | ORAL | Status: DC | PRN
  Administered 2022-10-28 – 2022-10-29 (×2): 7.5 mg via ORAL
  Filled 2022-10-28 (×2): qty 1

## 2022-10-28 NOTE — Group Note (Signed)
LCSW Group Therapy Note  Group Date: 10/28/2022 Start Time: 1310 End Time: 1400   Type of Therapy and Topic:  Group Therapy - Coping Skills  Participation Level:  Did Not Attend   Description of Group The focus of this group was to determine what unhealthy coping techniques typically are used by group members and what healthy coping techniques would be helpful in coping with various problems. Patients were guided in becoming aware of the differences between healthy and unhealthy coping techniques. Patients were asked to identify 2-3 healthy coping skills they would like to learn to use more effectively.  Therapeutic Goals Patients learned that coping is what human beings do all day long to deal with various situations in their lives Patients defined and discussed healthy vs unhealthy coping techniques Patients identified their preferred coping techniques and identified whether these were healthy or unhealthy Patients determined 2-3 healthy coping skills they would like to become more familiar with and use more often. Patients provided support and ideas to each other   Summary of Patient Progress:  The patient did not attend group.     Marshell Levan, LCSWA 10/28/2022  2:26 PM

## 2022-10-28 NOTE — Plan of Care (Signed)
D- Patient alert and oriented. Affect/mood. Denies SI, HI, AVH, Pt is irritable Scheduled medications administered to patient, per MD orders. Support and encouragement provided.  Routine safety checks conducted every 15 minutes.  Patient informed to notify staff with problems or concerns. R- No adverse drug reactions noted. Patient contracts for safety at this time. Patient compliant with medications and treatment plan. Patient receptive, calm, and cooperative. Patient interacts well with others on the unit.  Patient remains safe at this time.

## 2022-10-28 NOTE — Group Note (Unsigned)
Date:  10/28/2022 Time:  8:53 PM  Group Topic/Focus:  Dimensions of Wellness:   The focus of this group is to introduce the topic of wellness and discuss the role each dimension of wellness plays in total health.     Participation Level:  {BHH PARTICIPATION NWGNF:62130}  Participation Quality:  {BHH PARTICIPATION QUALITY:22265}  Affect:  {BHH AFFECT:22266}  Cognitive:  {BHH COGNITIVE:22267}  Insight: {BHH Insight2:20797}  Engagement in Group:  {BHH ENGAGEMENT IN QMVHQ:46962}  Modes of Intervention:  {BHH MODES OF INTERVENTION:22269}  Additional Comments:  ***    10/28/2022, 8:53 PM

## 2022-10-28 NOTE — Group Note (Signed)
Date:  10/28/2022 Time:  1:52 AM  Group Topic/Focus:  Goals Group:   The focus of this group is to help patients establish daily goals to achieve during treatment and discuss how the patient can incorporate goal setting into their daily lives to aide in recovery.    Participation Level:  Active  Participation Quality:  Appropriate  Affect:  Appropriate  Cognitive:  Appropriate  Insight: Good  Engagement in Group:  Engaged  Modes of Intervention:  Discussion  Additional Comments:    Lenore Cordia 10/28/2022, 1:52 AM

## 2022-10-28 NOTE — Group Note (Signed)
Date:  10/28/2022 Time:  11:00 PM  Group Topic/Focus:  Dimensions of Wellness:   The focus of this group is to introduce the topic of wellness and discuss the role each dimension of wellness plays in total health.    Participation Level:  Did Not Attend  Participation Quality:   pt  did not attend  Affect:   pt did not attend  Cognitive:   pt did not attend  Insight: None  Engagement in Group:   pt did not attend  Modes of Intervention:   pt did not attend  Additional Comments:       10/28/2022, 11:00 PM

## 2022-10-28 NOTE — Group Note (Unsigned)
Date:  10/28/2022 Time:  9:57 PM  Group Topic/Focus:  Dimensions of Wellness:   The focus of this group is to introduce the topic of wellness and discuss the role each dimension of wellness plays in total health.     Participation Level:  {BHH PARTICIPATION JXBJY:78295}  Participation Quality:  {BHH PARTICIPATION QUALITY:22265}  Affect:  {BHH AFFECT:22266}  Cognitive:  {BHH COGNITIVE:22267}  Insight: {BHH Insight2:20797}  Engagement in Group:  {BHH ENGAGEMENT IN AOZHY:86578}  Modes of Intervention:  {BHH MODES OF INTERVENTION:22269}  Additional Comments:  ***    10/28/2022, 9:57 PM

## 2022-10-28 NOTE — Progress Notes (Signed)
[facility] MD Progress Note  10/28/2022 1:17 PM Michael Schmitt  MRN:  [id number] Subjective:  Michael Schmitt still having a lot of anxiety and depression.  Still has suicidal thoughts.He was started on lithium with no problems.  He had trouble sleeping last night. Principal Problem: Major depressive disorder, recurrent episode, severe (HCC) Diagnosis: Principal Problem:   Major depressive disorder, recurrent episode, severe (HCC)  Total Time spent with patient: 15 minutes  Past Psychiatric History: Depression and polysubstance abuse  Past Medical History:  Past Medical History:  Diagnosis Date   Anxiety    Anxiety    Arthritis    cerv. stenosis, spondylosis, HNP- lower back , has been followed in pain clinic, has  had injection s in cerv. 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 Cervical three-four.   CARDIAC CATHETERIZATION N/A 01/01/2015   Procedure: Left Heart Cath and Coronary Angiography;  Surgeon: Rinaldo Cloud, MD;   Location: [facility] INVASIVE CV LAB;  Service: Cardiovascular;  Laterality: N/A;   CARDIAC CATHETERIZATION N/A 04/05/2016   Procedure: Left Heart Cath and Coronary Angiography;  Surgeon: Lyn Records, MD;  Location: [facility] INVASIVE CV LAB;  Service: Cardiovascular;  Laterality: N/A;   CARDIAC CATHETERIZATION N/A 04/05/2016   Procedure: Coronary Stent Intervention;  Surgeon: Lyn Records, MD;  Location: [facility] INVASIVE CV LAB;  Service: Cardiovascular;  Laterality: N/A;   CARDIAC CATHETERIZATION N/A 04/05/2016   Procedure: Intravascular Ultrasound/IVUS;  Surgeon: Lyn Records, MD;  Location: [facility] INVASIVE CV LAB;  Service: Cardiovascular;  Laterality: N/A;   NASAL SINUS SURGERY     2005   Family History:  Family History  Problem Relation Age of Onset   Prostate cancer Father    Hypertension Mother    Kidney Stones Mother    Anxiety disorder Mother    Depression Mother    COPD Sister    Hypertension Sister    Diabetes Sister    Depression Sister    Anxiety disorder Sister    Seizures Sister    ADD / ADHD Son    ADD / ADHD Daughter    Family Psychiatric  History: Unremarkable Social History:  Social History   Substance and Sexual Activity  Alcohol Use No   Alcohol/week: 0.0 standard drinks of alcohol   Comment: last drinked in 3 months.      Social History   Substance and Sexual Activity  Drug Use No  Social History   Socioeconomic History   Marital status: Widowed    Spouse name: Not on file   Number of children: 3   Years of education: 11   Highest education level: Not on file  Occupational History    Comment: unemployed  Tobacco Use   Smoking status: Every Day    Current packs/day: 2.00    Average packs/day: 2.0 packs/day for 17.0 years (34.0 ttl pk-yrs)    Types: Cigarettes, Cigars   Smokeless tobacco: Former   Tobacco comments:    occassional snuff   Vaping Use   Vaping status: Never Used  Substance and Sexual Activity   Alcohol use: No    Alcohol/week: 0.0 standard  drinks of alcohol    Comment: last drinked in 3 months.    Drug use: No   Sexual activity: Not Currently  Other Topics Concern   Not on file  Social History Narrative   Patient is right handed.   Patient drinks 2 sodas daily.   Social Determinants of Health   Financial Resource Strain: Medium Risk (08/22/2022)   Received from [facility]   Overall Financial Resource Strain (CARDIA)    Difficulty of Paying Living Expenses: Somewhat hard  Food Insecurity: Patient Declined (10/25/2022)   Hunger Vital Sign    Worried About Running Out of Food in the Last Year: Patient declined    Ran Out of Food in the Last Year: Patient declined  Transportation Needs: No Transportation Needs (10/25/2022)   PRAPARE - Administrator, Civil Service (Medical): No    Lack of Transportation (Non-Medical): No  Physical Activity: Not on file  Stress: Not on file  Social Connections: Not on file   Additional Social History:                         Sleep: Poor  Appetite:  Fair  Current Medications: Current Facility-Administered Medications  Medication Dose Route Frequency Provider Last Rate Last Admin   acetaminophen (TYLENOL) tablet 650 mg  650 mg Oral Q6H PRN Bennett, Christal H, NP   650 mg at 10/28/22 1203   alum & mag hydroxide-simeth (MAALOX/MYLANTA) 200-200-20 MG/5ML suspension 30 mL  30 mL Oral Q4H PRN Bennett, Christal H, NP       amLODipine (NORVASC) tablet 5 mg  5 mg Oral Daily Lauree Chandler, NP   5 mg at 10/28/22 0835   atorvastatin (LIPITOR) tablet 20 mg  20 mg Oral Daily Lauree Chandler, NP   20 mg at 10/28/22 0835   carvedilol (COREG) tablet 6.25 mg  6.25 mg Oral BID WC Lauree Chandler, NP   6.25 mg at 10/28/22 0051   clopidogrel (PLAVIX) tablet 75 mg  75 mg Oral Daily Lauree Chandler, NP   75 mg at 10/28/22 1021   cyclobenzaprine (FLEXERIL) tablet 10 mg  10 mg Oral TID PRN Lauree Chandler, NP   10 mg at 10/28/22 1203    diphenhydrAMINE (BENADRYL) capsule 50 mg  50 mg Oral TID PRN Thurston Hole H, NP   50 mg at 10/27/22 1742   Or   diphenhydrAMINE (BENADRYL) injection 50 mg  50 mg Intramuscular TID PRN Willeen Cass, Christal H, NP   50 mg at 10/27/22 0939   DULoxetine (CYMBALTA) DR capsule 30 mg  30 mg Oral BID Sarina Ill, DO   30 mg at 10/28/22 0835   feeding supplement (ENSURE ENLIVE / ENSURE PLUS)  liquid 237 mL  237 mL Oral BID BM Sarina Ill, DO   237 mL at 10/28/22 1000   gabapentin (NEURONTIN) capsule 300 mg  300 mg Oral TID Lauree Chandler, NP   300 mg at 10/28/22 1204   glipiZIDE (GLUCOTROL XL) 24 hr tablet 10 mg  10 mg Oral Q breakfast Lauree Chandler, NP   10 mg at 10/28/22 0836   haloperidol (HALDOL) tablet 5 mg  5 mg Oral TID PRN Thurston Hole H, NP   5 mg at 10/27/22 1743   Or   haloperidol lactate (HALDOL) injection 5 mg  5 mg Intramuscular TID PRN Thurston Hole H, NP   5 mg at 10/27/22 0940   hydrOXYzine (ATARAX) tablet 50 mg  50 mg Oral TID PRN Thurston Hole H, NP   50 mg at 10/27/22 2116   lithium carbonate (LITHOBID) ER tablet 300 mg  300 mg Oral [id number] Sarina Ill, DO   300 mg at 10/28/22 4098   magnesium hydroxide (MILK OF MAGNESIA) suspension 30 mL  30 mL Oral Daily PRN Bennett, Christal H, NP       metFORMIN (GLUCOPHAGE) tablet 1,000 mg  1,000 mg Oral BID WC Lauree Chandler, NP   1,000 mg at 10/28/22 1191   multivitamin with minerals tablet 1 tablet  1 tablet Oral Daily Sarina Ill, DO   1 tablet at 10/28/22 4782   nicotine (NICODERM CQ - dosed in mg/24 hours) patch 21 mg  21 mg Transdermal Daily Lauree Chandler, NP       nicotine polacrilex (NICORETTE) gum 2 mg  2 mg Oral PRN Sarina Ill, DO   2 mg at 10/26/22 1622   QUEtiapine (SEROQUEL) tablet 200 mg  200 mg Oral QHS Sarina Ill, DO   200 mg at 10/27/22 2116   rOPINIRole (REQUIP) tablet 0.5 mg  0.5 mg Oral QHS Bennett, Christal H, NP   0.5  mg at 10/27/22 2116    Lab Results: No results found for this or any previous visit (from the past 48 hour(s)).  Blood Alcohol level:  Lab Results  Component Value Date   ETH <10 10/25/2022   ETH <10 10/07/2022    Metabolic Disorder Labs: Lab Results  Component Value Date   HGBA1C 7.4 (H) 05/18/2018   MPG 165.68 05/18/2018   MPG 151.33 03/31/2018   Lab Results  Component Value Date   PROLACTIN 18.1 (H) 09/12/2016   PROLACTIN 6.5 05/27/2016   Lab Results  Component Value Date   CHOL 106 02/25/2021   TRIG 213 (H) 02/25/2021   HDL 28 (L) 02/25/2021   CHOLHDL 3.8 02/25/2021   VLDL 43 (H) 02/25/2021   LDLCALC 35 02/25/2021   LDLCALC UNABLE TO CALCULATE IF TRIGLYCERIDE OVER 400 mg/dL 95/62/1308    Physical Findings: AIMS:  , ,  ,  ,    CIWA:    COWS:     Musculoskeletal: Strength & Muscle Tone: within normal limits Gait & Station: normal Patient leans: N/A  Psychiatric Specialty Exam:  Presentation  General Appearance:  Appropriate for Environment  Eye Contact: Good  Speech: Clear and Coherent; Normal Rate  Speech Volume: Normal  Handedness: Right   Mood and Affect  Mood: Depressed; Dysphoric; Anxious  Affect: Congruent; Blunt   Thought Process  Thought Processes: Coherent; Goal Directed; Linear  Descriptions of Associations:Intact  Orientation:Full (Time, Place and Person)  Thought Content:Logical  History of Schizophrenia/Schizoaffective disorder:No  Duration of Psychotic Symptoms:No data  recorded Hallucinations:No data recorded Ideas of Reference:None  Suicidal Thoughts:No data recorded Homicidal Thoughts:No data recorded  Sensorium  Memory: Immediate Good  Judgment: Intact  Insight: Present   Executive Functions  Concentration: Fair  Attention Span: Fair  Recall: [employer] of Knowledge: Fair  Language: Fair   Psychomotor Activity  Psychomotor Activity:No data recorded  Assets   Assets: Communication Skills; Desire for Improvement; Financial Resources/Insurance; Housing; Leisure Time; Resilience; Social Support   Sleep  Sleep:No data recorded    Blood pressure 93/83, pulse 63, temperature 98 F (36.7 C), temperature source Oral, resp. rate 12, height 6\' 2"  (1.88 m), weight 83 kg, SpO2 98%. Body mass index is 23.5 kg/m.   Treatment Plan Summary: Daily contact with patient to assess and evaluate symptoms and progress in treatment, Medication management, and Plan Mobic as needed.  He Neurontin 603 times a day Seroquel 800 at bedtime  Sarina Ill, DO 10/28/2022, 1:17 PM

## 2022-10-28 NOTE — Group Note (Signed)
Date:  10/28/2022 Time:  1:29 PM  Group Topic/Focus:  Goals Group:   The focus of this group is to help patients establish daily goals to achieve during treatment and discuss how the patient can incorporate goal setting into their daily lives to aide in recovery.    Participation Level:  Minimal  Participation Quality:  Appropriate  Affect:  Appropriate  Cognitive:  Appropriate  Insight: Appropriate  Engagement in Group:  Engaged  Modes of Intervention:  Activity, Discussion, and Education  Additional Comments:    Wilford Corner 10/28/2022, 1:29 PM

## 2022-10-29 DIAGNOSIS — F332 Major depressive disorder, recurrent severe without psychotic features: Secondary | ICD-10-CM | POA: Diagnosis not present

## 2022-10-29 MED ORDER — MELOXICAM 7.5 MG PO TABS
7.5000 mg | ORAL_TABLET | Freq: Two times a day (BID) | ORAL | Status: DC
  Administered 2022-10-29 – 2022-11-01 (×6): 7.5 mg via ORAL
  Filled 2022-10-29 (×6): qty 1

## 2022-10-29 MED ORDER — OLANZAPINE 5 MG PO TABS
5.0000 mg | ORAL_TABLET | ORAL | Status: DC
  Administered 2022-10-29 – 2022-10-30 (×3): 5 mg via ORAL
  Filled 2022-10-29 (×3): qty 1

## 2022-10-29 NOTE — Plan of Care (Signed)
  Problem: Education: Goal: Knowledge of General Education information will improve Description: Including pain rating scale, medication(s)/side effects and non-pharmacologic comfort measures Outcome: Progressing   Problem: Health Behavior/Discharge Planning: Goal: Ability to manage health-related needs will improve Outcome: Progressing   Problem: Clinical Measurements: Goal: Ability to maintain clinical measurements within normal limits will improve Outcome: Progressing Goal: Will remain free from infection Outcome: Progressing Goal: Diagnostic test results will improve Outcome: Progressing Goal: Respiratory complications will improve Outcome: Progressing Goal: Cardiovascular complication will be avoided Outcome: Progressing   Problem: Activity: Goal: Risk for activity intolerance will decrease Outcome: Progressing   Problem: Nutrition: Goal: Adequate nutrition will be maintained Outcome: Progressing   Problem: Coping: Goal: Level of anxiety will decrease Outcome: Progressing   Problem: Elimination: Goal: Will not experience complications related to bowel motility Outcome: Progressing Goal: Will not experience complications related to urinary retention Outcome: Progressing   Problem: Pain Managment: Goal: General experience of comfort will improve Outcome: Progressing   Problem: Safety: Goal: Ability to remain free from injury will improve Outcome: Progressing   Problem: Skin Integrity: Goal: Risk for impaired skin integrity will decrease Outcome: Progressing   Problem: Education: Goal: Knowledge of [facility] Education information/materials will improve Outcome: Progressing Goal: Emotional status will improve Outcome: Progressing Goal: Mental status will improve Outcome: Progressing Goal: Verbalization of understanding the information provided will improve Outcome: Progressing   Problem: Activity: Goal: Interest or engagement in activities will  improve Outcome: Progressing Goal: Sleeping patterns will improve Outcome: Progressing   Problem: Coping: Goal: Ability to verbalize frustrations and anger appropriately will improve Outcome: Progressing Goal: Ability to demonstrate self-control will improve Outcome: Progressing   Problem: Health Behavior/Discharge Planning: Goal: Identification of resources available to assist in meeting health care needs will improve Outcome: Progressing Goal: Compliance with treatment plan for underlying cause of condition will improve Outcome: Progressing   Problem: Physical Regulation: Goal: Ability to maintain clinical measurements within normal limits will improve Outcome: Progressing   Problem: Safety: Goal: Periods of time without injury will increase Outcome: Progressing   

## 2022-10-29 NOTE — Progress Notes (Signed)
[facility] MD Progress Note  10/29/2022 11:37 AM Michael Schmitt  MRN:  [id number] Subjective: Michael Schmitt is seen on rounds.  He is not sleeping very well.  He wants to go on Goody's powders for his back pain.  I put him on Mobic and since he is taking it talked to him about taking him off lithium and starting Zyprexa.  He was agreeable to that.  He is able to contract for safety in the hospital. Principal Problem: Major depressive disorder, recurrent episode, severe (HCC) Diagnosis: Principal Problem:   Major depressive disorder, recurrent episode, severe (HCC)  Total Time spent with patient: 15 minutes  Past Psychiatric History: Depression  Past Medical History:  Past Medical History:  Diagnosis Date   Anxiety    Anxiety    Arthritis    cerv. stenosis, spondylosis, HNP- lower back , has been followed in pain clinic, has  had injection s in cerv. 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 Cervical three-four.   CARDIAC  CATHETERIZATION N/A 01/01/2015   Procedure: Left Heart Cath and Coronary Angiography;  Surgeon: Rinaldo Cloud, MD;  Location: [facility] INVASIVE CV LAB;  Service: Cardiovascular;  Laterality: N/A;   CARDIAC CATHETERIZATION N/A 04/05/2016   Procedure: Left Heart Cath and Coronary Angiography;  Surgeon: Lyn Records, MD;  Location: [facility] INVASIVE CV LAB;  Service: Cardiovascular;  Laterality: N/A;   CARDIAC CATHETERIZATION N/A 04/05/2016   Procedure: Coronary Stent Intervention;  Surgeon: Lyn Records, MD;  Location: [facility] INVASIVE CV LAB;  Service: Cardiovascular;  Laterality: N/A;   CARDIAC CATHETERIZATION N/A 04/05/2016   Procedure: Intravascular Ultrasound/IVUS;  Surgeon: Lyn Records, MD;  Location: [facility] INVASIVE CV LAB;  Service: Cardiovascular;  Laterality: N/A;   NASAL SINUS SURGERY     2005   Family History:  Family History  Problem Relation Age of Onset   Prostate cancer Father    Hypertension Mother    Kidney Stones Mother    Anxiety disorder Mother    Depression Mother    COPD Sister    Hypertension Sister    Diabetes Sister    Depression Sister    Anxiety disorder Sister    Seizures Sister    ADD / ADHD Son    ADD / ADHD Daughter    Family Psychiatric  History: Unremarkable Social History:  Social History   Substance and Sexual Activity  Alcohol Use No   Alcohol/week: 0.0  standard drinks of alcohol   Comment: last drinked in 3 months.      Social History   Substance and Sexual Activity  Drug Use No    Social History   Socioeconomic History   Marital status: Widowed    Spouse name: Not on file   Number of children: 3   Years of education: 82   Highest education level: Not on file  Occupational History    Comment: unemployed  Tobacco Use   Smoking status: Every Day    Current packs/day: 2.00    Average packs/day: 2.0 packs/day for 17.0 years (34.0 ttl pk-yrs)    Types: Cigarettes, Cigars   Smokeless tobacco: Former   Tobacco comments:    occassional snuff   Vaping  Use   Vaping status: Never Used  Substance and Sexual Activity   Alcohol use: No    Alcohol/week: 0.0 standard drinks of alcohol    Comment: last drinked in 3 months.    Drug use: No   Sexual activity: Not Currently  Other Topics Concern   Not on file  Social History Narrative   Patient is right handed.   Patient drinks 2 sodas daily.   Social Determinants of Health   Financial Resource Strain: Medium Risk (08/22/2022)   Received from [facility]   Overall Financial Resource Strain (CARDIA)    Difficulty of Paying Living Expenses: Somewhat hard  Food Insecurity: Patient Declined (10/25/2022)   Hunger Vital Sign    Worried About Running Out of Food in the Last Year: Patient declined    Ran Out of Food in the Last Year: Patient declined  Transportation Needs: No Transportation Needs (10/25/2022)   PRAPARE - Administrator, Civil Service (Medical): No    Lack of Transportation (Non-Medical): No  Physical Activity: Not on file  Stress: Not on file  Social Connections: Not on file   Additional Social History:                         Sleep: poor  Appetite: Good  Current Medications: Current Facility-Administered Medications  Medication Dose Route Frequency Provider Last Rate Last Admin   acetaminophen (TYLENOL) tablet 650 mg  650 mg Oral Q6H PRN Bennett, Christal H, NP   650 mg at 10/29/22 0630   alum & mag hydroxide-simeth (MAALOX/MYLANTA) 200-200-20 MG/5ML suspension 30 mL  30 mL Oral Q4H PRN Bennett, Christal H, NP       amLODipine (NORVASC) tablet 5 mg  5 mg Oral Daily Lauree Chandler, NP   5 mg at 10/29/22 0900   atorvastatin (LIPITOR) tablet 20 mg  20 mg Oral Daily Lauree Chandler, NP   20 mg at 10/29/22 0900   carvedilol (COREG) tablet 6.25 mg  6.25 mg Oral BID WC Lauree Chandler, NP   6.25 mg at 10/29/22 0900   clopidogrel (PLAVIX) tablet 75 mg  75 mg Oral Daily Lauree Chandler, NP   75 mg at 10/29/22 0900    cyclobenzaprine (FLEXERIL) tablet 10 mg  10 mg Oral TID PRN Lauree Chandler, NP   10 mg at 10/29/22 0905   diphenhydrAMINE (BENADRYL) capsule 50 mg  50 mg Oral TID PRN Thurston Hole H, NP   50 mg at 10/27/22 1742   Or   diphenhydrAMINE (BENADRYL) injection 50 mg  50 mg Intramuscular TID PRN Willeen Cass, Christal H, NP   50 mg at 10/27/22 [phone]  DULoxetine (CYMBALTA) DR capsule 30 mg  30 mg Oral BID Sarina Ill, DO   30 mg at 10/29/22 0900   feeding supplement (ENSURE ENLIVE / ENSURE PLUS) liquid 237 mL  237 mL Oral BID BM Sarina Ill, DO   237 mL at 10/29/22 8295   gabapentin (NEURONTIN) capsule 600 mg  600 mg Oral TID Sarina Ill, DO   600 mg at 10/29/22 0900   glipiZIDE (GLUCOTROL XL) 24 hr tablet 10 mg  10 mg Oral Q breakfast Lauree Chandler, NP   10 mg at 10/29/22 0900   haloperidol (HALDOL) tablet 5 mg  5 mg Oral TID PRN Thurston Hole H, NP   5 mg at 10/27/22 1743   Or   haloperidol lactate (HALDOL) injection 5 mg  5 mg Intramuscular TID PRN Thurston Hole H, NP   5 mg at 10/27/22 0940   hydrOXYzine (ATARAX) tablet 50 mg  50 mg Oral TID PRN Thurston Hole H, NP   50 mg at 10/28/22 2145   magnesium hydroxide (MILK OF MAGNESIA) suspension 30 mL  30 mL Oral Daily PRN Bennett, Christal H, NP       meloxicam (MOBIC) tablet 7.5 mg  7.5 mg Oral BID Sarina Ill, DO       metFORMIN (GLUCOPHAGE) tablet 1,000 mg  1,000 mg Oral BID WC Lauree Chandler, NP   1,000 mg at 10/29/22 0900   multivitamin with minerals tablet 1 tablet  1 tablet Oral Daily Sarina Ill, DO   1 tablet at 10/29/22 0901   nicotine (NICODERM CQ - dosed in mg/24 hours) patch 21 mg  21 mg Transdermal Daily Lauree Chandler, NP       nicotine polacrilex (NICORETTE) gum 2 mg  2 mg Oral PRN Sarina Ill, DO   2 mg at 10/26/22 1622   OLANZapine (ZYPREXA) tablet 5 mg  5 mg Oral [id number] ,  Edward, DO       QUEtiapine (SEROQUEL) tablet  800 mg  800 mg Oral QHS Sarina Ill, DO   800 mg at 10/28/22 2145   rOPINIRole (REQUIP) tablet 0.5 mg  0.5 mg Oral QHS Bennett, Christal H, NP   0.5 mg at 10/28/22 2145    Lab Results: No results found for this or any previous visit (from the past 48 hour(s)).  Blood Alcohol level:  Lab Results  Component Value Date   ETH <10 10/25/2022   ETH <10 10/07/2022    Metabolic Disorder Labs: Lab Results  Component Value Date   HGBA1C 7.4 (H) 05/18/2018   MPG 165.68 05/18/2018   MPG 151.33 03/31/2018   Lab Results  Component Value Date   PROLACTIN 18.1 (H) 09/12/2016   PROLACTIN 6.5 05/27/2016   Lab Results  Component Value Date   CHOL 106 02/25/2021   TRIG 213 (H) 02/25/2021   HDL 28 (L) 02/25/2021   CHOLHDL 3.8 02/25/2021   VLDL 43 (H) 02/25/2021   LDLCALC 35 02/25/2021   LDLCALC UNABLE TO CALCULATE IF TRIGLYCERIDE OVER 400 mg/dL 62/13/0865    Physical Findings: AIMS:  , ,  ,  ,    CIWA:    COWS:     Musculoskeletal: Strength & Muscle Tone: Good Gait & Station: Good Patient leans: N/A  Psychiatric Specialty Exam:  Presentation  General Appearance:  Appropriate for Environment  Eye Contact: Good  Speech: Clear and Coherent; Normal Rate  Speech Volume: Normal  Handedness: Right   Mood and Affect  Mood: Depressed; Dysphoric; Anxious  Affect: Congruent; Blunt   Thought Process  Thought Processes: Coherent; Goal Directed; Linear  Descriptions of Associations:Intact  Orientation:Full (Time, Place and Person)  Thought Content:Logical  History of Schizophrenia/Schizoaffective disorder:No  Duration of Psychotic Symptoms:No data recorded Hallucinations:No data recorded Ideas of Reference:None  Suicidal Thoughts:No data recorded Homicidal Thoughts:No data recorded  Sensorium  Memory: Immediate Good  Judgment: Intact  Insight: Present   Executive Functions  Concentration: Fair  Attention  Span: Fair  Recall: [employer] of Knowledge: Fair  Language: Fair   Psychomotor Activity  Psychomotor Activity:No data recorded  Assets  Assets: Communication Skills; Desire for Improvement; Financial Resources/Insurance; Housing; Leisure Time; Resilience; Social Support   Sleep  Sleep:No data recorded    Blood pressure 125/77, pulse (!) 107, temperature (!) 97.4 F (36.3 C), resp. rate 20, height 6\' 2"  (1.88 m), weight 83 kg, SpO2 99%. Body mass index is 23.5 kg/m.   Treatment Plan Summary: Daily contact with patient to assess and evaluate symptoms and progress in treatment, Medication management, and Plan discontinue lithium and start Zyprexa 5 mg twice a day and increase Mobic to twice a day.  Sarina Ill, DO 10/29/2022, 11:37 AM

## 2022-10-29 NOTE — Group Note (Signed)
Date:  10/29/2022 Time:  9:48 PM  Group Topic/Focus:   Wrap-Up Group:   The focus of this group is to help patients review their daily goal of treatment and discuss progress on daily workbooks.    Participation Level:  Active  Participation Quality:  Appropriate and Attentive  Affect:  Appropriate  Cognitive:  Appropriate  Insight: Appropriate and Good  Engagement in Group:  Supportive  Modes of Intervention:  Support  Additional Comments:     Belva Crome 10/29/2022, 9:48 PM

## 2022-10-29 NOTE — Group Note (Signed)
[facility] LCSW Group Therapy Note   Group Date: 10/29/2022 Start Time: 1300 End Time: 1400   Type of Therapy/Topic:  Group Therapy:  Balance in Life  Participation Level:  Did Not Attend   Description of Group:    This group will address the concept of balance and how it feels and looks when one is unbalanced. Patients will be encouraged to process areas in their lives that are out of balance, and identify reasons for remaining unbalanced. Facilitators will guide patients utilizing problem- solving interventions to address and correct the stressor making their life unbalanced. Understanding and applying boundaries will be explored and addressed for obtaining  and maintaining a balanced life. Patients will be encouraged to explore ways to assertively make their unbalanced needs known to significant others in their lives, using other group members and facilitator for support and feedback.  Therapeutic Goals: Patient will identify two or more emotions or situations they have that consume much of in their lives. Patient will identify signs/triggers that life has become out of balance:  Patient will identify two ways to set boundaries in order to achieve balance in their lives:  Patient will demonstrate ability to communicate their needs through discussion and/or role plays  Summary of Patient Progress:   The patient did not attend.      Marshell Levan, LCSW

## 2022-10-29 NOTE — Progress Notes (Signed)
Pt reports continued pain in his neck, and passive SI.  Pt discussed with MD and received updated medication treatment which Pt states works well.  Pt affect is still sad but his interaction and activity are markedly improved over his date of admission.  Continued monitoring for safety.    10/29/22 1700  Psych Admission Type (Psych Patients Only)  Admission Status Voluntary  Psychosocial Assessment  Patient Complaints Depression  Eye Contact Fair  Facial Expression Sad  Affect Depressed  Speech Logical/coherent  Interaction Attention-seeking  Motor Activity Slow  Appearance/Hygiene In scrubs  Behavior Characteristics Calm  Mood Sad  Thought Process  Coherency WDL  Content Preoccupation  Delusions None reported or observed  Perception WDL  Hallucination None reported or observed  Judgment WDL  Confusion WDL  Danger to Self  Current suicidal ideation? Passive  Danger to Others  Danger to Others None reported or observed

## 2022-10-30 DIAGNOSIS — F332 Major depressive disorder, recurrent severe without psychotic features: Secondary | ICD-10-CM | POA: Diagnosis not present

## 2022-10-30 MED ORDER — OLANZAPINE 5 MG PO TABS
7.5000 mg | ORAL_TABLET | ORAL | Status: DC
  Administered 2022-10-30 – 2022-11-01 (×4): 7.5 mg via ORAL
  Filled 2022-10-30 (×5): qty 2

## 2022-10-30 NOTE — Progress Notes (Signed)
[facility] MD Progress Note  10/30/2022 11:02 AM Michael Schmitt  MRN:  [id number] Subjective: Michael Schmitt is seen on rounds.  He is still having negative thoughts and suicidal ideation.  He says that the Zyprexa is helping and asks if we can go up on it.  He has been tolerating his medications without any problems.  He is more relaxed and goal directed in his thought process. Principal Problem: Major depressive disorder, recurrent episode, severe (HCC) Diagnosis: Principal Problem:   Major depressive disorder, recurrent episode, severe (HCC)  Total Time spent with patient: 15 minutes  Past Psychiatric History: Major depressive disorder versus bipolar  Past Medical History:  Past Medical History:  Diagnosis Date   Anxiety    Anxiety    Arthritis    cerv. stenosis, spondylosis, HNP- lower back , has been followed in pain clinic, has  had injection s in cerv. 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 Cervical three-four.    CARDIAC CATHETERIZATION N/A 01/01/2015   Procedure: Left Heart Cath and Coronary Angiography;  Surgeon: Rinaldo Cloud, MD;  Location: [facility] INVASIVE CV LAB;  Service: Cardiovascular;  Laterality: N/A;   CARDIAC CATHETERIZATION N/A 04/05/2016   Procedure: Left Heart Cath and Coronary Angiography;  Surgeon: Lyn Records, MD;  Location: [facility] INVASIVE CV LAB;  Service: Cardiovascular;  Laterality: N/A;   CARDIAC CATHETERIZATION N/A 04/05/2016   Procedure: Coronary Stent Intervention;  Surgeon: Lyn Records, MD;  Location: [facility] INVASIVE CV LAB;  Service: Cardiovascular;  Laterality: N/A;   CARDIAC CATHETERIZATION N/A 04/05/2016   Procedure: Intravascular Ultrasound/IVUS;  Surgeon: Lyn Records, MD;  Location: [facility] INVASIVE CV LAB;  Service: Cardiovascular;  Laterality: N/A;   NASAL SINUS SURGERY     2005   Family History:  Family History  Problem Relation Age of Onset   Prostate cancer Father    Hypertension Mother    Kidney Stones Mother    Anxiety disorder Mother    Depression Mother    COPD Sister    Hypertension Sister    Diabetes Sister    Depression Sister    Anxiety disorder Sister    Seizures Sister    ADD / ADHD Son    ADD / ADHD Daughter    Family Psychiatric  History: Unremarkable Social History:  Social History   Substance and Sexual Activity  Alcohol Use No   Alcohol/week: 0.0 standard drinks of alcohol  Comment: last drinked in 3 months.      Social History   Substance and Sexual Activity  Drug Use No    Social History   Socioeconomic History   Marital status: Widowed    Spouse name: Not on file   Number of children: 3   Years of education: 21   Highest education level: Not on file  Occupational History    Comment: unemployed  Tobacco Use   Smoking status: Every Day    Current packs/day: 2.00    Average packs/day: 2.0 packs/day for 17.0 years (34.0 ttl pk-yrs)    Types: Cigarettes, Cigars   Smokeless tobacco: Former   Tobacco comments:    occassional snuff    Vaping Use   Vaping status: Never Used  Substance and Sexual Activity   Alcohol use: No    Alcohol/week: 0.0 standard drinks of alcohol    Comment: last drinked in 3 months.    Drug use: No   Sexual activity: Not Currently  Other Topics Concern   Not on file  Social History Narrative   Patient is right handed.   Patient drinks 2 sodas daily.   Social Determinants of Health   Financial Resource Strain: Medium Risk (08/22/2022)   Received from [facility], [facility]   Overall Financial Resource Strain (CARDIA)    Difficulty of Paying Living Expenses: Somewhat hard  Food Insecurity: Patient Declined (10/25/2022)   Hunger Vital Sign    Worried About Running Out of Food in the Last Year: Patient declined    Ran Out of Food in the Last Year: Patient declined  Transportation Needs: No Transportation Needs (10/25/2022)   PRAPARE - Administrator, Civil Service (Medical): No    Lack of Transportation (Non-Medical): No  Physical Activity: Not on file  Stress: Not on file  Social Connections: Not on file   Additional Social History:                         Sleep: Good  Appetite:  Good  Current Medications: Current Facility-Administered Medications  Medication Dose Route Frequency Provider Last Rate Last Admin   acetaminophen (TYLENOL) tablet 650 mg  650 mg Oral Q6H PRN Bennett, Christal H, NP   650 mg at 10/30/22 0642   alum & mag hydroxide-simeth (MAALOX/MYLANTA) 200-200-20 MG/5ML suspension 30 mL  30 mL Oral Q4H PRN Bennett, Christal H, NP       amLODipine (NORVASC) tablet 5 mg  5 mg Oral Daily Lauree Chandler, NP   5 mg at 10/30/22 1610   atorvastatin (LIPITOR) tablet 20 mg  20 mg Oral Daily Lauree Chandler, NP   20 mg at 10/30/22 9604   carvedilol (COREG) tablet 6.25 mg  6.25 mg Oral BID WC Lauree Chandler, NP   6.25 mg at 10/30/22 5409   clopidogrel (PLAVIX) tablet 75 mg  75 mg Oral Daily Lauree Chandler, NP   75 mg at 10/30/22  8119   cyclobenzaprine (FLEXERIL) tablet 10 mg  10 mg Oral TID PRN Lauree Chandler, NP   10 mg at 10/30/22 1478   diphenhydrAMINE (BENADRYL) capsule 50 mg  50 mg Oral TID PRN Thurston Hole H, NP   50 mg at 10/27/22 1742   Or   diphenhydrAMINE (BENADRYL) injection 50 mg  50 mg Intramuscular TID PRN Bennett, Christal H, NP   50 mg at 10/27/22 0939   DULoxetine (CYMBALTA) DR capsule 30  mg  30 mg Oral BID Sarina Ill, DO   30 mg at 10/30/22 3295   feeding supplement (ENSURE ENLIVE / ENSURE PLUS) liquid 237 mL  237 mL Oral BID BM Sarina Ill, DO   237 mL at 10/29/22 1515   gabapentin (NEURONTIN) capsule 600 mg  600 mg Oral TID Sarina Ill, DO   600 mg at 10/30/22 1884   glipiZIDE (GLUCOTROL XL) 24 hr tablet 10 mg  10 mg Oral Q breakfast Lauree Chandler, NP   10 mg at 10/30/22 1660   haloperidol (HALDOL) tablet 5 mg  5 mg Oral TID PRN Thurston Hole H, NP   5 mg at 10/27/22 1743   Or   haloperidol lactate (HALDOL) injection 5 mg  5 mg Intramuscular TID PRN Thurston Hole H, NP   5 mg at 10/27/22 0940   hydrOXYzine (ATARAX) tablet 50 mg  50 mg Oral TID PRN Thurston Hole H, NP   50 mg at 10/30/22 6301   magnesium hydroxide (MILK OF MAGNESIA) suspension 30 mL  30 mL Oral Daily PRN Bennett, Christal H, NP       meloxicam (MOBIC) tablet 7.5 mg  7.5 mg Oral BID Sarina Ill, DO   7.5 mg at 10/30/22 6010   metFORMIN (GLUCOPHAGE) tablet 1,000 mg  1,000 mg Oral BID WC Lauree Chandler, NP   1,000 mg at 10/30/22 9323   multivitamin with minerals tablet 1 tablet  1 tablet Oral Daily Sarina Ill, DO   1 tablet at 10/30/22 5573   nicotine (NICODERM CQ - dosed in mg/24 hours) patch 21 mg  21 mg Transdermal Daily Lauree Chandler, NP       nicotine polacrilex (NICORETTE) gum 2 mg  2 mg Oral PRN Sarina Ill, DO   2 mg at 10/29/22 1241   OLANZapine (ZYPREXA) tablet 5 mg  5 mg Oral [id number] Sarina Ill, DO   5  mg at 10/30/22 2202   QUEtiapine (SEROQUEL) tablet 800 mg  800 mg Oral QHS Sarina Ill, DO   800 mg at 10/29/22 2140   rOPINIRole (REQUIP) tablet 0.5 mg  0.5 mg Oral QHS Bennett, Christal H, NP   0.5 mg at 10/29/22 2140    Lab Results: No results found for this or any previous visit (from the past 48 hour(s)).  Blood Alcohol level:  Lab Results  Component Value Date   ETH <10 10/25/2022   ETH <10 10/07/2022    Metabolic Disorder Labs: Lab Results  Component Value Date   HGBA1C 7.4 (H) 05/18/2018   MPG 165.68 05/18/2018   MPG 151.33 03/31/2018   Lab Results  Component Value Date   PROLACTIN 18.1 (H) 09/12/2016   PROLACTIN 6.5 05/27/2016   Lab Results  Component Value Date   CHOL 106 02/25/2021   TRIG 213 (H) 02/25/2021   HDL 28 (L) 02/25/2021   CHOLHDL 3.8 02/25/2021   VLDL 43 (H) 02/25/2021   LDLCALC 35 02/25/2021   LDLCALC UNABLE TO CALCULATE IF TRIGLYCERIDE OVER 400 mg/dL 54/27/0623    Physical Findings: AIMS:  , ,  ,  ,    CIWA:    COWS:     Musculoskeletal: Strength & Muscle Tone: within normal limits Gait & Station: normal Patient leans: N/A  Psychiatric Specialty Exam:  Presentation  General Appearance:  Appropriate for Environment  Eye Contact: Good  Speech: Clear and Coherent; Normal Rate  Speech Volume: Normal  Handedness: Right  Mood and Affect  Mood: Depressed; Dysphoric; Anxious  Affect: Congruent; Blunt   Thought Process  Thought Processes: Coherent; Goal Directed; Linear  Descriptions of Associations:Intact  Orientation:Full (Time, Place and Person)  Thought Content:Logical  History of Schizophrenia/Schizoaffective disorder:No  Duration of Psychotic Symptoms:No data recorded Hallucinations:No data recorded Ideas of Reference:None  Suicidal Thoughts:No data recorded Homicidal Thoughts:No data recorded  Sensorium  Memory: Immediate Good  Judgment: Intact  Insight: Present   Executive  Functions  Concentration: Fair  Attention Span: Fair  Recall: [employer] of Knowledge: Fair  Language: Fair   Psychomotor Activity  Psychomotor Activity:No data recorded  Assets  Assets: Communication Skills; Desire for Improvement; Financial Resources/Insurance; Housing; Leisure Time; Resilience; Social Support   Sleep  Sleep:No data recorded    Blood pressure 132/67, pulse 74, temperature 97.6 F (36.4 C), temperature source Oral, resp. rate 18, height 6\' 2"  (1.88 m), weight 83 kg, SpO2 97%. Body mass index is 23.5 kg/m.   Treatment Plan Summary: Daily contact with patient to assess and evaluate symptoms and progress in treatment, Medication management, and Plan increase Zyprexa to 7.5 mg twice a day.   Tresea Mall, DO 10/30/2022, 11:02 AM

## 2022-10-30 NOTE — Progress Notes (Signed)
   10/30/22 2000  Psych Admission Type (Psych Patients Only)  Admission Status Voluntary  Psychosocial Assessment  Patient Complaints Anxiety  Eye Contact Fair  Facial Expression Flat  Affect Depressed;Appropriate to circumstance  Speech Logical/coherent  Interaction Assertive  Motor Activity Slow  Appearance/Hygiene Unremarkable  Behavior Characteristics Cooperative;Appropriate to situation  Mood Pleasant  Thought Process  Coherency WDL  Content WDL  Delusions None reported or observed  Perception WDL  Hallucination None reported or observed  Judgment Poor  Confusion None  Danger to Self  Current suicidal ideation? Denies  Self-Injurious Behavior No self-injurious ideation or behavior indicators observed or expressed   Agreement Not to Harm Self Yes  Description of Agreement Verbal  Danger to Others  Danger to Others None reported or observed

## 2022-10-30 NOTE — Progress Notes (Signed)
Pt rated his anxiety and depression an 8 on a scale of 0-10 (10 being the worst). Pt said that he is unable to identify his stressors. His coping skills include walking and reading. He reports sleeping well after taking his seroquel at bedtime. He reports that his appetite has been improving. He also reported that the addition of meloxicam to his medication regimen has helped with his neck pain. He stays to himself on the unit and will walk around the unit. He denies experiencing any side effects from his medications. Pt denies SI/HI and AVH. Active listening, reassurance, and support provided. Q 15 min safety checks continue. Pt's safety has been maintained.   10/29/22 2143  Psych Admission Type (Psych Patients Only)  Admission Status Voluntary  Psychosocial Assessment  Patient Complaints Anxiety;Depression  Eye Contact Fair  Facial Expression Flat;Sad  Affect Depressed  Speech Logical/coherent  Interaction Assertive  Motor Activity Slow  Appearance/Hygiene Improved  Behavior Characteristics Appropriate to situation;Cooperative  Mood Depressed;Sad;Pleasant  Thought Process  Coherency WDL  Content Preoccupation  Delusions None reported or observed  Perception WDL  Hallucination None reported or observed  Judgment Limited  Confusion None  Danger to Self  Current suicidal ideation? Denies  Self-Injurious Behavior No self-injurious ideation or behavior indicators observed or expressed   Agreement Not to Harm Self Yes  Description of Agreement verbally contracts for safety  Danger to Others  Danger to Others None reported or observed

## 2022-10-30 NOTE — Group Note (Signed)
Date:  10/30/2022 Time:  11:07 PM  Group Topic/Focus:  Developing a Wellness Toolbox:   The focus of this group is to help patients develop a "wellness toolbox" with skills and strategies to promote recovery upon discharge.    Participation Level:  Active  Participation Quality:  Appropriate  Affect:  Appropriate  Cognitive:  Appropriate  Insight: Good  Engagement in Group:  Engaged  Modes of Intervention:  Discussion  Additional Comments:    Lenore Cordia 10/30/2022, 11:07 PM

## 2022-10-30 NOTE — Group Note (Signed)
Recreation Therapy Group Note   Group Topic:Coping Skills  Group Date: 10/30/2022 Start Time: 1000 End Time: 1100 Facilitators: Rosina Lowenstein, LRT, CTRS Location: Craft Room  Group Description: Mind Map.  Patient was provided a blank template of a diagram with 32 blank boxes in a tiered system, branching from the center (similar to a bubble chart). LRT directed patients to label the middle of the diagram "Coping Skills". LRT and patients then came up with 8 different coping skills as examples. Pt were directed to record their coping skills in the 2nd tier boxes closest to the center.  Patients would then share their coping skills with the group as LRT wrote them out. LRT gave a handout of 99 different coping skills at the end of group.    Goal Area(s) Addressed: Patients will be able to define "coping skills". Patient will identify new coping skills.  Patient will increase communication.   Affect/Mood: N/A   Participation Level: Did not attend    Clinical Observations/Individualized Feedback: Michael Schmitt did not attend group.  Plan: Continue to engage patient in RT group sessions 2-3x/week.   Rosina Lowenstein, LRT, CTRS 10/30/2022 11:41 AM

## 2022-10-30 NOTE — Plan of Care (Signed)
Patient rated his depression and anxiety 8/10. Verbalized suicidal thoughts. Patient contracts for safety.Patient stated that his pain is better today that brings down his anxiety. Patient visible in the milieu. Appropriate with staff & peers. Denies HI and AVH. Appetite and energy level good. Support and encouragement given.

## 2022-10-30 NOTE — Progress Notes (Signed)
   10/30/22 0555  15 Minute Checks  Location Bedroom  Visual Appearance Calm  Behavior Sleeping  Sleep (Behavioral Health Patients Only)  Calculate sleep? (Click Yes once per 24 hr at 0600 safety check) Yes  Documented sleep last 24 hours 9.25

## 2022-10-30 NOTE — Group Note (Signed)
[facility] LCSW Group Therapy Note    Group Date: 10/30/2022 Start Time: 1330 End Time: 1430  Type of Therapy and Topic:  Group Therapy:  Overcoming Obstacles  Participation Level:  [facility] PARTICIPATION LEVEL: None  Mood:  Description of Group:   In this group patients will be encouraged to explore what they see as obstacles to their own wellness and recovery. They will be guided to discuss their thoughts, feelings, and behaviors related to these obstacles. The group will process together ways to cope with barriers, with attention given to specific choices patients can make. Each patient will be challenged to identify changes they are motivated to make in order to overcome their obstacles. This group will be process-oriented, with patients participating in exploration of their own experiences as well as giving and receiving support and challenge from other group members.  Therapeutic Goals: 1. Patient will identify personal and current obstacles as they relate to admission. 2. Patient will identify barriers that currently interfere with their wellness or overcoming obstacles.  3. Patient will identify feelings, thought process and behaviors related to these barriers. 4. Patient will identify two changes they are willing to make to overcome these obstacles:    Summary of Patient Progress Patient left group early and did not engage in group discussion.   Therapeutic Modalities:   Cognitive Behavioral Therapy Solution Focused Therapy Motivational Interviewing Relapse Prevention Therapy   Harden Mo, LCSW

## 2022-10-30 NOTE — Group Note (Signed)
Date:  10/30/2022 Time:  10:02 AM  Group Topic/Focus:  OUTDOOR RECREATION AND RAPPORT BUILDING.    Participation Level:  Minimal  Participation Quality:  Appropriate  Affect:  Appropriate  Cognitive:  Appropriate  Insight: Appropriate  Engagement in Group:  Limited  Modes of Intervention:  Activity  Additional Comments:       10/30/2022, 10:02 AM

## 2022-10-31 DIAGNOSIS — F3132 Bipolar disorder, current episode depressed, moderate: Principal | ICD-10-CM

## 2022-10-31 LAB — LIPID PANEL
Cholesterol: 122 mg/dL (ref 0–200)
HDL: 36 mg/dL — ABNORMAL LOW
LDL Cholesterol: 39 mg/dL (ref 0–99)
Total CHOL/HDL Ratio: 3.4 ratio
Triglycerides: 236 mg/dL — ABNORMAL HIGH
VLDL: 47 mg/dL — ABNORMAL HIGH (ref 0–40)

## 2022-10-31 LAB — HEMOGLOBIN A1C
Hgb A1c MFr Bld: 6.8 % — ABNORMAL HIGH (ref 4.8–5.6)
Mean Plasma Glucose: 148.46 mg/dL

## 2022-10-31 NOTE — Group Note (Signed)
Recreation Therapy Group Note   Group Topic:Self-Esteem  Group Date: 10/31/2022 Start Time: 1005 End Time: 1105 Facilitators: Rosina Lowenstein, LRT, CTRS Location:  Craft Room   Group Description: My strengths and Qualities. Patients and LRT discussed the importance of self-love/self-esteem and things that cause it to fluctuate. Pt completed a worksheet that helps them identify 24 different strengths and qualities about themselves. Pt encouraged to read aloud at least 3 off their sheet to the group. LRT and pts discussed how this can be applied to daily life post-discharge.  Pt's then played "Positive Affirmation Bingo" afterwards, with stress balls as prizes.   Goal Area(s) Addressed: Patient will identify positive qualities about themselves. Patient will learn new positive affirmations.  Patient will recite positive qualities and affirmations aloud to the group.  Patient will increase communication.  Affect/Mood: Appropriate and Flat   Participation Level: Moderate   Participation Quality: Independent   Behavior: Appropriate and Calm   Speech/Thought Process: Coherent   Insight: Fair   Judgement: Good   Modes of Intervention: Activity and Worksheet   Patient Response to Interventions:  Disengaged   Education Outcome:  In group clarification offered    Clinical Observations/Individualized Feedback: Michael Schmitt was mostly active in their participation of session activities and group discussion. Pt left group early after filling out most of his sheet. Pt left group early and minimally interacted with LRT and peers.    Plan: Continue to engage patient in RT group sessions 2-3x/week.   Rosina Lowenstein, LRT, CTRS 10/31/2022 12:02 PM

## 2022-10-31 NOTE — Plan of Care (Signed)
  Problem: Education: Goal: Knowledge of General Education information will improve Description: Including pain rating scale, medication(s)/side effects and non-pharmacologic comfort measures Outcome: Progressing   Problem: Health Behavior/Discharge Planning: Goal: Ability to manage health-related needs will improve Outcome: Progressing   Problem: Clinical Measurements: Goal: Ability to maintain clinical measurements within normal limits will improve Outcome: Progressing Goal: Will remain free from infection Outcome: Progressing Goal: Diagnostic test results will improve Outcome: Progressing Goal: Respiratory complications will improve Outcome: Progressing Goal: Cardiovascular complication will be avoided Outcome: Progressing   Problem: Activity: Goal: Risk for activity intolerance will decrease Outcome: Progressing   Problem: Nutrition: Goal: Adequate nutrition will be maintained Outcome: Progressing   Problem: Coping: Goal: Level of anxiety will decrease Outcome: Progressing   Problem: Elimination: Goal: Will not experience complications related to bowel motility Outcome: Progressing Goal: Will not experience complications related to urinary retention Outcome: Progressing   Problem: Pain Managment: Goal: General experience of comfort will improve Outcome: Progressing   Problem: Safety: Goal: Ability to remain free from injury will improve Outcome: Progressing   Problem: Skin Integrity: Goal: Risk for impaired skin integrity will decrease Outcome: Progressing   Problem: Education: Goal: Knowledge of [facility] Education information/materials will improve Outcome: Progressing Goal: Emotional status will improve Outcome: Progressing Goal: Mental status will improve Outcome: Progressing Goal: Verbalization of understanding the information provided will improve Outcome: Progressing   Problem: Activity: Goal: Interest or engagement in activities will  improve Outcome: Progressing Goal: Sleeping patterns will improve Outcome: Progressing   Problem: Coping: Goal: Ability to verbalize frustrations and anger appropriately will improve Outcome: Progressing Goal: Ability to demonstrate self-control will improve Outcome: Progressing   Problem: Health Behavior/Discharge Planning: Goal: Identification of resources available to assist in meeting health care needs will improve Outcome: Progressing Goal: Compliance with treatment plan for underlying cause of condition will improve Outcome: Progressing   Problem: Physical Regulation: Goal: Ability to maintain clinical measurements within normal limits will improve Outcome: Progressing   Problem: Safety: Goal: Periods of time without injury will increase Outcome: Progressing   

## 2022-10-31 NOTE — Progress Notes (Signed)
[facility] MD Progress Note  10/31/2022  Michael Schmitt  MRN:  [id number]  Subjective: Case discussed with RN and Child psychotherapist.  Patient seen during rounds.  Patient reports he is tolerating medication well without any side effects.  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  Patient reports he wants to continue the medicine.as it has helped him with symptoms.  Patient denies auditory or visual donations.  Patient denies thoughts of self or others.  He feels at his baseline, asking for discharge tomorrow. Sw consulted to help with a safe discharge plan.  Principal Problem: Major depressive disorder, recurrent episode, severe (HCC) Diagnosis: Principal Problem:   Major depressive disorder, recurrent episode, severe (HCC)   Past Psychiatric History: Major depressive disorder versus bipolar  Past Medical History:  Past Medical History:  Diagnosis Date   Anxiety    Anxiety    Arthritis    cerv. stenosis, spondylosis, HNP- lower back , has been followed in pain clinic, has  had injection s in cerv. 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 Cervical three-four.   CARDIAC CATHETERIZATION N/A 01/01/2015   Procedure: Left Heart Cath and Coronary Angiography;  Surgeon: Rinaldo Cloud, MD;  Location: [facility] INVASIVE CV LAB;  Service: Cardiovascular;  Laterality: N/A;   CARDIAC CATHETERIZATION N/A 04/05/2016   Procedure: Left Heart Cath and Coronary Angiography;  Surgeon: Lyn Records, MD;  Location: [facility] INVASIVE CV LAB;  Service: Cardiovascular;  Laterality: N/A;   CARDIAC CATHETERIZATION N/A 04/05/2016   Procedure: Coronary Stent Intervention;  Surgeon: Lyn Records, MD;  Location: [facility] INVASIVE CV LAB;  Service: Cardiovascular;  Laterality: N/A;   CARDIAC CATHETERIZATION N/A 04/05/2016   Procedure: Intravascular Ultrasound/IVUS;  Surgeon: Lyn Records, MD;  Location: [facility] INVASIVE CV LAB;  Service: Cardiovascular;  Laterality: N/A;   NASAL SINUS SURGERY     2005   Family History:  Family History  Problem Relation Age of Onset   Prostate cancer Father    Hypertension Mother    Kidney Stones Mother    Anxiety disorder Mother    Depression Mother    COPD Sister    Hypertension Sister    Diabetes Sister    Depression Sister    Anxiety disorder Sister    Seizures Sister    ADD / ADHD Son    ADD / ADHD Daughter    Family Psychiatric  History: Unremarkable Social History:  Social History   Substance and Sexual Activity  Alcohol Use No   Alcohol/week: 0.0 standard drinks of alcohol   Comment: last drinked in 3 months.      Social History   Substance and Sexual Activity  Drug Use No    Social History   Socioeconomic History   Marital status: Widowed    Spouse name: Not on file   Number of children: 3   Years of education: 66   Highest education level: Not on file  Occupational History    Comment: unemployed  Tobacco Use   Smoking status: Every Day    Current packs/day: 2.00     Average packs/day: 2.0 packs/day for 17.0 years (34.0 ttl pk-yrs)    Types: Cigarettes, Cigars   Smokeless tobacco: Former   Tobacco comments:    occassional snuff   Vaping Use   Vaping status: Never Used  Substance and Sexual Activity   Alcohol use: No    Alcohol/week: 0.0 standard drinks of alcohol    Comment: last drinked in 3 months.    Drug use: No   Sexual activity: Not Currently  Other Topics Concern   Not on file  Social History Narrative   Patient is right handed.   Patient drinks 2 sodas daily.   Social Determinants of Health   Financial Resource Strain: Medium Risk (08/22/2022)   [facility]   Overall Financial Resource Strain (CARDIA)    Difficulty of Paying Living Expenses: Somewhat hard  Food Insecurity: Patient Declined (10/25/2022)   Hunger Vital Sign    Worried About Running Out of Food in the Last Year: Patient declined    Ran Out of Food in the Last Year: Patient declined  Transportation Needs: No Transportation Needs (10/25/2022)   PRAPARE - Administrator, Civil Service (Medical): No    Lack of Transportation (Non-Medical): No  Physical Activity: Not on file  Stress: Not on file  Social Connections: Not on file   Additional Social History:                         Sleep: Good  Appetite:  Good  Current Medications: Current Facility-Administered Medications  Medication Dose Route Frequency Provider Last Rate Last Admin   acetaminophen (TYLENOL) tablet 650 mg  650 mg Oral Q6H PRN Bennett, Christal H, NP   650 mg at 10/30/22 0642   alum & mag hydroxide-simeth (MAALOX/MYLANTA) 200-200-20 MG/5ML suspension 30 mL  30 mL Oral Q4H PRN Bennett, Christal H, NP   30 mL at 10/31/22 1359   amLODipine (NORVASC) tablet 5 mg  5 mg Oral Daily Lauree Chandler, NP   5 mg at 10/31/22 0736   atorvastatin (LIPITOR) tablet 20 mg  20 mg Oral Daily Lauree Chandler, NP   20 mg at 10/31/22 0736   carvedilol  (COREG) tablet 6.25 mg  6.25 mg Oral BID WC Lauree Chandler, NP   6.25 mg at 10/31/22 1614   clopidogrel (PLAVIX) tablet 75 mg  75 mg Oral Daily Lauree Chandler, NP   75 mg at 10/31/22 0942   cyclobenzaprine (FLEXERIL) tablet 10 mg  10 mg Oral TID PRN Lauree Chandler, NP   10 mg at 10/30/22 8119   diphenhydrAMINE (BENADRYL) capsule 50 mg  50 mg Oral TID PRN Thurston Hole H, NP   50 mg at 10/27/22 1742   Or   diphenhydrAMINE (BENADRYL) injection 50  mg  50 mg Intramuscular TID PRN Willeen Cass, Christal H, NP   50 mg at 10/27/22 0939   DULoxetine (CYMBALTA) DR capsule 30 mg  30 mg Oral BID Sarina Ill, DO   30 mg at 10/31/22 0736   feeding supplement (ENSURE ENLIVE / ENSURE PLUS) liquid 237 mL  237 mL Oral BID BM Sarina Ill, DO   237 mL at 10/31/22 1615   gabapentin (NEURONTIN) capsule 600 mg  600 mg Oral TID Sarina Ill, DO   600 mg at 10/31/22 1233   glipiZIDE (GLUCOTROL XL) 24 hr tablet 10 mg  10 mg Oral Q breakfast Lauree Chandler, NP   10 mg at 10/31/22 1610   haloperidol (HALDOL) tablet 5 mg  5 mg Oral TID PRN Thurston Hole H, NP   5 mg at 10/27/22 1743   Or   haloperidol lactate (HALDOL) injection 5 mg  5 mg Intramuscular TID PRN Thurston Hole H, NP   5 mg at 10/27/22 0940   hydrOXYzine (ATARAX) tablet 50 mg  50 mg Oral TID PRN Thurston Hole H, NP   50 mg at 10/30/22 9604   magnesium hydroxide (MILK OF MAGNESIA) suspension 30 mL  30 mL Oral Daily PRN Willeen Cass, Christal H, NP       meloxicam (MOBIC) tablet 7.5 mg  7.5 mg Oral BID Sarina Ill, DO   7.5 mg at 10/31/22 0736   metFORMIN (GLUCOPHAGE) tablet 1,000 mg  1,000 mg Oral BID WC Lauree Chandler, NP   1,000 mg at 10/31/22 5409   multivitamin with minerals tablet 1 tablet  1 tablet Oral Daily Sarina Ill, DO   1 tablet at 10/31/22 8119   nicotine (NICODERM CQ - dosed in mg/24 hours) patch 21 mg  21 mg Transdermal Daily Lauree Chandler, NP        nicotine polacrilex (NICORETTE) gum 2 mg  2 mg Oral PRN Sarina Ill, DO   2 mg at 10/31/22 1359   OLANZapine (ZYPREXA) tablet 7.5 mg  7.5 mg Oral [id number] Sarina Ill, DO   7.5 mg at 10/31/22 1614   QUEtiapine (SEROQUEL) tablet 800 mg  800 mg Oral QHS Sarina Ill, DO   800 mg at 10/30/22 2056   rOPINIRole (REQUIP) tablet 0.5 mg  0.5 mg Oral QHS Bennett, Christal H, NP   0.5 mg at 10/30/22 2056    Lab Results:  Results for orders placed or performed during the hospital encounter of 10/25/22 (from the past 48 hour(s))  Lipid panel     Status: Abnormal   Collection Time: 10/31/22  7:33 AM  Result Value Ref Range   Cholesterol 122 0 - 200 mg/dL   Triglycerides 147 (H) <150 mg/dL   HDL 36 (L) >82 mg/dL   Total CHOL/HDL Ratio 3.4 RATIO   VLDL 47 (H) 0 - 40 mg/dL   LDL Cholesterol 39 0 - 99 mg/dL    Comment:        Total Cholesterol/HDL:CHD Risk Coronary Heart Disease Risk Table                     Men   Women  1/2 Average Risk   3.4   3.3  Average Risk       5.0   4.4  2 X Average Risk   9.6   7.1  3 X Average Risk  23.4   11.0        Use the  calculated Patient Ratio above and the CHD Risk Table to determine the patient's CHD Risk.        ATP III CLASSIFICATION (LDL):  <100     mg/dL   Optimal  784-696  mg/dL   Near or Above                    Optimal  130-159  mg/dL   Borderline  295-284  mg/dL   High  >132     mg/dL   Very High Performed at [facility], [address] [place], [place] [address]   Hemoglobin A1c     Status: Abnormal   Collection Time: 10/31/22  7:33 AM  Result Value Ref Range   Hgb A1c MFr Bld 6.8 (H) 4.8 - 5.6 %    Comment: (NOTE) Pre diabetes:          5.7%-6.4%  Diabetes:              >6.4%  Glycemic control for   <7.0% adults with diabetes    Mean Plasma Glucose 148.46 mg/dL    Comment: Performed at [facility], [address] [place], [place] [address]    Blood Alcohol level:  Lab Results   Component Value Date   [facility] <10 10/25/2022   ETH <10 10/07/2022    Metabolic Disorder Labs: Lab Results  Component Value Date   HGBA1C 6.8 (H) 10/31/2022   MPG 148.46 10/31/2022   MPG 165.68 05/18/2018   Lab Results  Component Value Date   PROLACTIN 18.1 (H) 09/12/2016   PROLACTIN 6.5 05/27/2016   Lab Results  Component Value Date   CHOL 122 10/31/2022   TRIG 236 (H) 10/31/2022   HDL 36 (L) 10/31/2022   CHOLHDL 3.4 10/31/2022   VLDL 47 (H) 10/31/2022   LDLCALC 39 10/31/2022   LDLCALC 35 02/25/2021    Physical Findings: AIMS:  , ,  ,  ,    CIWA:    COWS:     Musculoskeletal: Strength & Muscle Tone: within normal limits Gait & Station: normal Patient leans: N/A  Psychiatric Specialty Exam:  Presentation  General Appearance:  Appropriate for Environment  Eye Contact: Good  Speech: Clear and Coherent; Normal Rate  Speech Volume: Normal  Handedness: Right   Mood and Affect  Mood: Good  Affect: Congruent   Thought Process  Thought Processes: Coherent; Goal Directed; Linear  Descriptions of Associations:Intact  Orientation:Full (Time, Place and Person)  Thought Content:Logical  Hallucinations:Denies Ideas of Reference:None  Suicidal Thoughts:Denies Homicidal Thoughts:Denies  Sensorium  Memory: Immediate Good  Judgment: Intact  Insight: Present   Executive Functions  Concentration: Fair  Attention Span: Fair  Recall: [employer] of Knowledge: Fair  Language: Fair   Psychomotor Activity  Psychomotor Activity:Normal  Assets  Assets: Manufacturing systems engineer; Desire for Improvement; Financial Resources/Insurance; Housing; Leisure Time; Resilience; Social Support   Sleep  Sleep:Improved    Blood pressure (!) 146/96, pulse 81, temperature 97.9 F (36.6 C), resp. rate 16, height 6\' 2"  (1.88 m), weight 83 kg, SpO2 97%. Body mass index is 23.5 kg/m.   Treatment Plan Summary: Daily contact with patient to  assess and evaluate symptoms and progress in treatment and Medication management  Lewanda Rife, MD

## 2022-10-31 NOTE — Group Note (Signed)
Date:  10/31/2022 Time:  6:41 PM  Group Topic/Focus:  ACTIVITY GROUP AND WELLNESS SUPPORT    Participation Level:  Minimal  Participation Quality:  Appropriate  Affect:  Appropriate  Cognitive:  Appropriate  Insight: Appropriate  Engagement in Group:  Developing/Improving  Modes of Intervention:  Activity  Additional Comments:       10/31/2022, 6:41 PM

## 2022-10-31 NOTE — Plan of Care (Signed)

## 2022-10-31 NOTE — BHH Counselor (Signed)
CSW sat with pt during interaction with CPS worker, Manfred Arch. Pt was informed that they still have an open case and speaks about pt's daughter having witnessed him trying to hang himself. Pt stated that he did not try to hang himself and that he just said that because he wanted to get accepted at the hospital. He denied any suicide attempts in the last eight years. Pt shared that he was being seen through [facility] in the past but that he would like to reconnect with services through [facility].  Pt shared that he only came here because of chronic depression, inability to sleep, and sadness. Lelon Mast shared that they were following up because of comments that pt made in front of his child. She asked if he had a break down or episode of yelling in front of his child. Pt denies this. Pt and CPS worker discussed him taking his medication. He reported that he manages his own medication. CPS worker clarified some information around pt getting medication from the person that he lives with. Pt stressed that the person that he lives with gives him medication to help him with his anxiety. He was unable to identify what medication he received from the person that he lives with. Pt and CPS worker briefly discuss pt's plans post discharge. Continued inpatient treatment discussed briefly. Pt does not appear to be interested in this type of treatment facility moving forward. No other concerns expressed. Contact ended without incident.   CSW spoke with Lelon Mast S briefly regarding pt's aftercare plans. She was informed that he may potentially be discharged tomorrow. CSW also shared that appointment for follow up would be scheduled for pt prior to discharge. She asked if continued inpatient treatment had been discussed with/recommended for the pt. CSW shared that this had been brought up during treatment meeting with the interdisciplinary treatment team but pt had declined. CSW took down information to contact CPS worker once pt is  discharged Madelynn Done [phone]). No other concerns expressed. Contact ended without incident.   Vilma Meckel. Algis Greenhouse, MSW, LCSW, LCAS 10/31/2022 4:02 PM

## 2022-10-31 NOTE — Group Note (Signed)
[facility] LCSW Group Therapy Note   Group Date: 10/31/2022 Start Time: 1311 End Time: 1415  Type of Therapy/Topic:  Group Therapy:  Feelings about Diagnosis  Participation Level:  Did Not Attend    Description of Group:    This group will allow patients to explore their thoughts and feelings about diagnoses they have received. Patients will be guided to explore their level of understanding and acceptance of these diagnoses. Facilitator will encourage patients to process their thoughts and feelings about the reactions of others to their diagnosis, and will guide patients in identifying ways to discuss their diagnosis with significant others in their lives. This group will be process-oriented, with patients participating in exploration of their own experiences as well as giving and receiving support and challenge from other group members.   Therapeutic Goals: 1. Patient will demonstrate understanding of diagnosis as evidence by identifying two or more symptoms of the disorder:  2. Patient will be able to express two feelings regarding the diagnosis 3. Patient will demonstrate ability to communicate their needs through discussion and/or role plays  Summary of Patient Progress: X   Therapeutic Modalities:   Cognitive Behavioral Therapy Brief Therapy Feelings Identification    Glenis Smoker, LCSW

## 2022-10-31 NOTE — Group Note (Signed)
Date:  10/31/2022 Time:  2:39 PM  Group Topic/Focus:  Goals Group:   The focus of this group is to help patients establish daily goals to achieve during treatment and discuss how the patient can incorporate goal setting into their daily lives to aide in recovery.    Participation Level:  Minimal  Participation Quality:  Appropriate  Affect:  Appropriate  Cognitive:  Appropriate  Insight: Limited  Engagement in Group:  Limited  Modes of Intervention:  Discussion and Education  Additional Comments:    Wilford Corner 10/31/2022, 2:39 PM

## 2022-10-31 NOTE — Group Note (Signed)
Date:  10/31/2022 Time:  10:15 PM  Group Topic/Focus:  Developing a Wellness Toolbox:   The focus of this group is to help patients develop a "wellness toolbox" with skills and strategies to promote recovery upon discharge.    Participation Level:  Did Not Attend   Lenore Cordia 10/31/2022, 10:15 PM

## 2022-11-01 MED ORDER — MELOXICAM 7.5 MG PO TABS: 7.5000 mg | ORAL_TABLET | Freq: Two times a day (BID) | ORAL | 0 refills | Status: DC

## 2022-11-01 MED ORDER — GLIPIZIDE ER 10 MG PO TB24: 10.0000 mg | ORAL_TABLET | Freq: Every day | ORAL | 0 refills | Status: DC

## 2022-11-01 MED ORDER — NICOTINE POLACRILEX 2 MG MT GUM: 2.0000 mg | CHEWING_GUM | OROMUCOSAL | 0 refills | Status: DC | PRN

## 2022-11-01 MED ORDER — AMLODIPINE BESYLATE 5 MG PO TABS: 5.0000 mg | ORAL_TABLET | Freq: Every day | ORAL | 0 refills | Status: DC

## 2022-11-01 MED ORDER — DULOXETINE HCL 30 MG PO CPEP: 30.0000 mg | ORAL_CAPSULE | Freq: Two times a day (BID) | ORAL | 0 refills | Status: DC

## 2022-11-01 MED ORDER — CARVEDILOL 6.25 MG PO TABS: 6.2500 mg | ORAL_TABLET | Freq: Two times a day (BID) | ORAL | 0 refills | Status: DC

## 2022-11-01 MED ORDER — METFORMIN HCL 1000 MG PO TABS: 1000.0000 mg | ORAL_TABLET | Freq: Two times a day (BID) | ORAL | 0 refills | Status: AC

## 2022-11-01 MED ORDER — CLOPIDOGREL BISULFATE 75 MG PO TABS: 75.0000 mg | ORAL_TABLET | Freq: Every day | ORAL | 0 refills | Status: AC

## 2022-11-01 MED ORDER — ATORVASTATIN CALCIUM 20 MG PO TABS: 20.0000 mg | ORAL_TABLET | Freq: Every day | ORAL | 0 refills | Status: DC

## 2022-11-01 MED ORDER — QUETIAPINE FUMARATE 400 MG PO TABS: 800.0000 mg | ORAL_TABLET | Freq: Every day | ORAL | 0 refills | Status: DC

## 2022-11-01 MED ORDER — OLANZAPINE 5 MG PO TABS: 5.0000 mg | ORAL_TABLET | Freq: Three times a day (TID) | ORAL | 0 refills | Status: DC

## 2022-11-01 MED ORDER — HYDROXYZINE HCL 50 MG PO TABS: 50.0000 mg | ORAL_TABLET | Freq: Three times a day (TID) | ORAL | 0 refills | Status: DC | PRN

## 2022-11-01 MED ORDER — CYCLOBENZAPRINE HCL 10 MG PO TABS: 10.0000 mg | ORAL_TABLET | Freq: Three times a day (TID) | ORAL | 0 refills | Status: DC | PRN

## 2022-11-01 MED ORDER — ONDANSETRON HCL 4 MG PO TABS
4.0000 mg | ORAL_TABLET | Freq: Three times a day (TID) | ORAL | Status: DC | PRN
  Administered 2022-11-01: 4 mg via ORAL
  Filled 2022-11-01: qty 1

## 2022-11-01 MED ORDER — GABAPENTIN 300 MG PO CAPS: 600.0000 mg | ORAL_CAPSULE | Freq: Three times a day (TID) | ORAL | 0 refills | Status: DC

## 2022-11-01 MED ORDER — ROPINIROLE HCL 0.5 MG PO TABS: 0.5000 mg | ORAL_TABLET | Freq: Every day | ORAL | 0 refills | Status: DC

## 2022-11-01 NOTE — Progress Notes (Signed)
  [facility] Adult Case Management Discharge Plan :  Will you be returning to the same living situation after discharge:  Yes,  pt plans to return home upon discharge. At discharge, do you have transportation home?: Yes,  pt support system to provide transportation home. Do you have the ability to pay for your medications: Yes,  [employer].  Release of information consent forms completed and in the chart;  Patient's signature needed at discharge.  Patient to Follow up at:  Follow-up Information     [facility] Follow up.   Why: Your appointment is scheduled for Monday, 11/13/22 at 10:45AM. Thanks! Contact information: [address]  [address] [place] [address] [phone]         [facility] [place] [employer]. Follow up.   Why: Referral was made for ACTT services. Follow up with them directly regarding decision. Thanks! Contact information: [address] [place] [address] [place] [address] [phone]                 Next level of care provider has access to [facility] Link:no  Safety Planning and Suicide Prevention discussed: Yes,  SPE completed with sister, Benyam Sproul.     Has patient been referred to the Quitline?: Patient refused referral for treatment  Patient has been referred for addiction treatment: Yes, referral information given but appointment not made [employer] ACTT services (list facility).  Glenis Smoker, LCSW 11/01/2022, 2:02 PM

## 2022-11-01 NOTE — Progress Notes (Signed)
Took over care of patient at 0100.Patient up most of shift pacing floor with different request. Medication given with good relief. Patient did not sleep well last pm.

## 2022-11-01 NOTE — Group Note (Signed)
Date:  11/01/2022 Time:  10:31 AM  Group Topic/Focus:  Structured Activity Group    Participation Level:  Minimal  Participation Quality:  Appropriate  Affect:  Appropriate  Cognitive:  Appropriate  Insight: Appropriate  Engagement in Group:  Engaged  Modes of Intervention:  Discussion and Education  Additional Comments:     A  11/01/2022, 10:31 AM

## 2022-11-01 NOTE — Care Management Important Message (Signed)
Important Message  Patient Details  Name: Michael Schmitt MRN: [id number] Date of Birth: 1970-05-26   Medicare Important Message Given:  Yes  IM was reviewed with pt. He denied any interest in appealing discharge decision. CSW informed pt that the paperwork would be attached to his discharge paperwork. No other concerns expressed. Contact ended without incident.    Glenis Smoker, LCSW 11/01/2022, 2:01 PM

## 2022-11-01 NOTE — Plan of Care (Signed)
D- Patient alert and oriented. Affect/mood. Denies SI, HI, AVH, and pain. Pt is looking forward to discharge later today. Affect is bright and pt is future oriented  A- Scheduled medications administered to patient, per MD orders. Support and encouragement provided.  Routine safety checks conducted every 15 minutes.  Patient informed to notify staff with problems or concerns. R- No adverse drug reactions noted. Patient contracts for safety at this time. Patient compliant with medications and treatment plan. Patient receptive, calm, and cooperative. Patient interacts well with others on the unit.  Patient remains safe at this time.

## 2022-11-01 NOTE — Discharge Summary (Signed)
Physician Discharge Summary Note  Patient:  Michael Schmitt is an 52 y.o., male MRN:  [id number] DOB:  January 25, 1971 Patient phone:  [phone] (home)  Patient address:   [address] [place] [address],    Date of Admission:  10/25/2022 Date of Discharge: 11/01/22  Reason for Admission:  Per chart review, he has presented to the ED several times with complaints of SI and attempts to hang himself. Last month, he has presented to [facility] twice and [facility] once. He presented to [facility] on 10/07/22 for suicidal ideation and klonopin withdrawal; was seen by psychiatry on 10/08/22 and told psychiatry that he was out of klonopin and is therefore feeling suicidal; was psychiatrically cleared and discharged on 10/08/22. Appears he then presented to [facility] ED the same day, on 10/08/22, with c/o having tried to hang himself the night prior; he was seen by psychiatry and per note from 10/09/22 "Once informed he would not be receiving benzodiazapienes while in the hospital due to not being beneficial for his mental health, [name] requested to be discharged home.   Principal Problem: Bipolar affective disorder, currently depressed, moderate (HCC) Discharge Diagnoses: Principal Problem:   Bipolar affective disorder, currently depressed, moderate (HCC) Active Problems:   Major depressive disorder, recurrent episode, severe (HCC)   Past Psychiatric History: Bipolar affective disorder, polysubstance abuse   Past Medical History:  Past Medical History:  Diagnosis Date   Anxiety    Anxiety    Arthritis    cerv. stenosis, spondylosis, HNP- lower back , has been followed in pain clinic, has  had injection s in cerv. 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 Cervical three-four.   CARDIAC CATHETERIZATION N/A 01/01/2015   Procedure: Left Heart Cath and Coronary Angiography;  Surgeon: Rinaldo Cloud, MD;  Location: [facility] INVASIVE CV LAB;  Service: Cardiovascular;  Laterality: N/A;   CARDIAC CATHETERIZATION N/A 04/05/2016   Procedure: Left Heart Cath and Coronary Angiography;  Surgeon: Lyn Records, MD;  Location: [facility] INVASIVE CV LAB;  Service: Cardiovascular;  Laterality: N/A;   CARDIAC CATHETERIZATION N/A 04/05/2016   Procedure: Coronary Stent Intervention;  Surgeon: Lyn Records, MD;  Location: [facility] INVASIVE CV LAB;  Service: Cardiovascular;  Laterality: N/A;   CARDIAC CATHETERIZATION N/A 04/05/2016   Procedure: Intravascular Ultrasound/IVUS;  Surgeon: Lyn Records, MD;  Location: [facility] INVASIVE CV LAB;  Service: Cardiovascular;  Laterality: N/A;   NASAL SINUS SURGERY  2005   Family History:  Family History  Problem Relation Age of Onset   Prostate cancer Father    Hypertension Mother    Kidney Stones Mother    Anxiety disorder Mother    Depression Mother    COPD Sister    Hypertension Sister    Diabetes Sister    Depression Sister    Anxiety disorder Sister    Seizures Sister    ADD / ADHD Son    ADD / ADHD Daughter      Social History:  Social History   Substance and Sexual Activity  Alcohol Use No   Alcohol/week: 0.0 standard drinks of alcohol   Comment: last drinked in 3 months.      Social History   Substance and Sexual Activity  Drug Use No    Social History   Socioeconomic History   Marital status: Widowed    Spouse name: Not on file   Number of children: 3   Years of education: 71   Highest education level: Not on file  Occupational History    Comment: unemployed  Tobacco Use   Smoking status: Every Day    Current packs/day: 2.00    Average packs/day: 2.0 packs/day for 17.0 years (34.0 ttl pk-yrs)    Types: Cigarettes, Cigars   Smokeless tobacco: Former   Tobacco comments:    occassional snuff   Vaping Use   Vaping status: Never Used  Substance and Sexual Activity   Alcohol use: No    Alcohol/week: 0.0 standard drinks of alcohol    Comment: last drinked in 3 months.    Drug use: No   Sexual activity: Not Currently  Other Topics Concern   Not on file  Social History Narrative   Patient is right handed.   Patient drinks 2 sodas daily.   Social Determinants of Health   Financial Resource Strain: Medium Risk (08/22/2022)   [facility]   Overall Financial Resource Strain (CARDIA)    Difficulty of Paying Living Expenses: Somewhat hard  Food Insecurity: Patient Declined (10/25/2022)   Hunger Vital Sign    Worried About Running Out of Food in the Last Year: Patient declined    Ran Out of Food in the Last Year: Patient declined  Transportation Needs: No Transportation Needs (10/25/2022)   PRAPARE - Administrator, Civil Service (Medical): No    Lack of Transportation (Non-Medical): No  Physical Activity: Not on file  Stress: Not on file  Social Connections: Not on file    Hospital Course:  The patient was admitted to Inpatient psychiatric treatment for stabilization of depression and suicidal thoughts. Patient was placed on  suicidal precautions. The patient was evaluated and treated by the multidisciplinary treatment team including physicians, nurses, social workers and therapists. All medications were presented to the patient and the Patient gave consent to all the medications that they were given, as well as was explained the risks, benefits, side effects and alternatives of all medication therapies. The patient was integrated into the general milieu on the ward and encouraged to attend to his ADLs and participate in all groups and activities. During hospital course the Patient attended coping skill groups, music therapy and activity therapy groups. Patient was counseled on cognitive techniques/skills by multiple staff members and given support care by the staff.  Patient's medication regimen was evaluated and titrated to therapeutic levels to better Patient's overall daily functioning. Specifically, the patient was started on  Seroquel and Zyprexa by Dr. Marlou Porch.  Patient tolerated the medication well with no significant side effects. Patient was educated about side effects of these medicines.  Patient was informed about metabolic syndrome and weight gain caused by these antipsychotics.  Given history of diabetes patient was encouraged to taper off and discontinue Zyprexa.  Patient said that he will discuss this with his outpatient provider.  Patient was encouraged to eat healthy and do moderate exercise as tolerated.  During the hospitalization, the patient demonstrated a stabilization of mood and depression with improved sleep and appetite. At the time of discharge, the patient denied any suicidal ideation/homicidal ideation and was not overtly depressed, manic or psychotic. The Patient was interacting well in groups and on the unit with their peers. Patient was able to identify a safety plan to include speaking with family, contacting outpatient provider or calling 911 if hallucinations/delusions returned or worsened or  thoughts of self-harm or suicide return. Patient was counselled on outpatient follow-up that was arranged prior to discharge.  Musculoskeletal: Strength & Muscle Tone: within normal limits Gait & Station: normal Patient leans: N/A   Psychiatric Specialty Exam:   Presentation  General Appearance:  Appropriate for Environment   Eye Contact: Good   Speech: Clear and Coherent; Normal Rate   Speech Volume: Normal   Handedness: Right     Mood and Affect  Mood: Good   Affect: Congruent     Thought Process  Thought Processes: Coherent; Goal Directed; Linear   Descriptions of Associations:Intact   Orientation:Full (Time, Place and Person)   Thought Content:Logical   Hallucinations:Denies Ideas of Reference:None   Suicidal Thoughts:Denies Homicidal Thoughts:Denies   Sensorium  Memory: Immediate Good   Judgment: Intact   Insight: Present     Executive Functions  Concentration: Fair   Attention Span: Fair   Recall: [employer] of Knowledge: Fair   Language: Fair      Psychomotor Activity:Normal    Assets: Manufacturing systems engineer; Desire for Improvement; Financial Resources/Insurance; Housing; Leisure Time; Resilience; Social Support     Sleep  Sleep:Improved  Physical Exam Constitutional:      General: He is not in acute distress.    Appearance: He is not ill-appearing, toxic-appearing or diaphoretic.  Eyes:     General: No scleral icterus. Cardiovascular:     Rate and Rhythm: Normal rate.  Pulmonary:     Effort: Pulmonary effort is normal. No respiratory distress.  Neurological:     Mental Status: He is alert and oriented to person, place, and time.      Review of Systems  Constitutional:  Negative for chills and fever.  Respiratory:  Negative for shortness of breath.   Cardiovascular:  Negative for chest pain and palpitations.  Gastrointestinal:  Negative for abdominal pain.  Neurological:  Negative for headaches.    Blood pressure 125/88, pulse 77, temperature (!) 97.3 F (36.3 C), resp. rate 17, height 6\' 2"  (1.88 m), weight 83 kg, SpO2 97%. Body mass index is 23.5 kg/m.   Social History   Tobacco Use  Smoking Status Every Day   Current packs/day: 2.00   Average packs/day: 2.0 packs/day for 17.0 years (34.0 ttl pk-yrs)   Types: Cigarettes, Cigars  Smokeless Tobacco Former  Tobacco Comments   occassional snuff    Tobacco Cessation:  A prescription for an FDA-approved tobacco cessation medication provided at discharge   Blood Alcohol level:  Lab Results  Component Value Date   ETH <10 10/25/2022   ETH <10  10/07/2022    Metabolic Disorder Labs:  Lab Results  Component Value Date   HGBA1C 6.8 (H) 10/31/2022   MPG 148.46 10/31/2022   MPG 165.68 05/18/2018   Lab Results  Component Value Date   PROLACTIN 18.1 (H) 09/12/2016   PROLACTIN 6.5 05/27/2016   Lab Results  Component Value Date   CHOL 122 10/31/2022   TRIG 236 (H) 10/31/2022   HDL 36 (L) 10/31/2022   CHOLHDL 3.4 10/31/2022   VLDL 47 (H) 10/31/2022   LDLCALC 39 10/31/2022   LDLCALC 35 02/25/2021    See Psychiatric Specialty Exam and Suicide Risk Assessment completed by Attending Physician prior to discharge.  Discharge destination:  Home  Is patient on multiple antipsychotic therapies at discharge:  Yes,   Do you recommend tapering to monotherapy for antipsychotics?  Yes   Recommended Plan for Multiple Antipsychotic Therapies: Patient's medications are in the process of a cross-taper;  medications include:  Taper off Olanzapine and Continue Quetiapine   Allergies as of 11/01/2022       Reactions   Prednisone Other (See Comments)   Hypertension and makes him feel "spacey"   Amoxicillin Other (See Comments)   Other Reaction: ELEVATED BP   Hydrocodone Other (See Comments)   Causes headaches and makes the patient irritable   Varenicline Other (See Comments)   Suicidal thoughts   Wellbutrin [bupropion]     Seizures    Other Anxiety   Other reaction(s): Other (See Comments) Patient stated that this keeps him awake; "has an opposite effect on me"   Trazodone And Nefazodone Anxiety, Other (See Comments)   Patient stated that this keeps him awake; "has an opposite effect on me"        Medication List     STOP taking these medications    clonazePAM 0.5 MG tablet Commonly known as: KLONOPIN   doxepin 25 MG capsule Commonly known as: SINEQUAN   escitalopram 10 MG tablet Commonly known as: Lexapro   escitalopram 20 MG tablet Commonly known as: LEXAPRO   gabapentin 800 MG tablet Commonly known as: NEURONTIN Replaced by: gabapentin 300 MG capsule   LORazepam 0.5 MG tablet Commonly known as: ATIVAN   LORazepam 1 MG tablet Commonly known as: Ativan   mirtazapine 15 MG tablet Commonly known as: REMERON   oxyCODONE-acetaminophen 7.5-325 MG tablet Commonly known as: Percocet   pantoprazole 40 MG tablet Commonly known as: PROTONIX   sucralfate 1 g tablet Commonly known as: CARAFATE   traZODone 50 MG tablet Commonly known as: DESYREL       TAKE these medications      Indication  albuterol 108 (90 Base) MCG/ACT inhaler Commonly known as: VENTOLIN HFA Inhale 2 puffs into the lungs every 6 (six) hours as needed for wheezing or shortness of breath.  Indication: Spasm of Lung Air Passages   amLODipine 5 MG tablet Commonly known as: NORVASC Take 1 tablet (5 mg total) by mouth daily. Start taking on: November 02, 2022  Indication: High Blood Pressure Disorder   atorvastatin 20 MG tablet Commonly known as: LIPITOR Take 1 tablet (20 mg total) by mouth daily.  Indication: High Amount of Fats in the Blood   carvedilol 6.25 MG tablet Commonly known as: COREG Take 1 tablet (6.25 mg total) by mouth 2 (two) times daily with a meal.  Indication: High Blood Pressure Disorder   clopidogrel 75 MG tablet Commonly known as: PLAVIX Take 1 tablet (75 mg total) by mouth  daily. Start taking on: November 02, 2022  Indication: Disease of the Peripheral Arteries   cyclobenzaprine 10 MG tablet Commonly known as: FLEXERIL Take 1 tablet (10 mg total) by mouth 3 (three) times daily as needed for muscle spasms.  Indication: Muscle Spasm   DULoxetine 30 MG capsule Commonly known as: CYMBALTA Take 1 capsule (30 mg total) by mouth 2 (two) times daily. What changed:  medication strength how much to take when to take this Another medication with the same name was removed. Continue taking this medication, and follow the directions you see here.  Indication: Major Depressive Disorder   gabapentin 300 MG capsule Commonly known as: NEURONTIN Take 2 capsules (600 mg total) by mouth 3 (three) times daily. Replaces: gabapentin 800 MG tablet  Indication: Peripheral Nerve Disease   glipiZIDE 10 MG 24 hr tablet Commonly known as: GLUCOTROL XL Take 1 tablet (10 mg total) by mouth daily with breakfast. Start taking on: November 02, 2022  Indication: Type 2 Diabetes   hydrOXYzine 50 MG tablet Commonly known as: ATARAX Take 1 tablet (50 mg total) by mouth 3 (three) times daily as needed for anxiety.  Indication: Feeling Anxious   meloxicam 7.5 MG tablet Commonly known as: MOBIC Take 1 tablet (7.5 mg total) by mouth 2 (two) times daily. What changed:  how much to take when to take this  Indication: Pain   metFORMIN 1000 MG tablet Commonly known as: GLUCOPHAGE Take 1 tablet (1,000 mg total) by mouth 2 (two) times daily with a meal. What changed:  medication strength when to take this  Indication: Type 2 Diabetes   nicotine polacrilex 2 MG gum Commonly known as: NICORETTE Take 1 each (2 mg total) by mouth as needed for smoking cessation.  Indication: Nicotine Addiction   nitroGLYCERIN 0.4 MG SL tablet Commonly known as: NITROSTAT Place 0.4 mg under the tongue every 5 (five) minutes as needed.  Indication: Acute Angina Pectoris   OLANZapine 5 MG  tablet Commonly known as: ZyPREXA Take 1 tablet (5 mg total) by mouth in the morning, at noon, and at bedtime. What changed:  medication strength how much to take when to take this  Indication: Manic Phase of Manic-Depression   QUEtiapine 400 MG tablet Commonly known as: SEROQUEL Take 2 tablets (800 mg total) by mouth at bedtime. What changed: Another medication with the same name was removed. Continue taking this medication, and follow the directions you see here.  Indication: Manic Phase of Manic-Depression   rOPINIRole 0.5 MG tablet Commonly known as: REQUIP Take 1 tablet (0.5 mg total) by mouth at bedtime.  Indication: Restless Leg Syndrome       PATIENTS CONDITION AT DISCHARGE:  Stable  TOBACCO CESSATION SCREENING  Patient was screened and counselled on smoking cessation at time of discharge.   PRESCRIPTION ARE LOCATED:  On Chart  DISCHARGE INSTRUCTIONS:  1. Diet: Cardiac and diabetic  2. Activity: As tolerated  3. Take medications as prescribed and not to make any changes without first consulting with the outpatient provider.  4. Patient was advised to avoid any illicit drugs or alcohol due to negative impact on physical and mental health.  5. Patient should keep all follow up appointments.  TIME SPENT ON DISCHARGE: Over 35 minutes were spent on this patients discharge including a face to face encounter, patient counseling and preparation of discharge materials.   Signed: Lewanda Rife, MD

## 2022-11-01 NOTE — BHH Suicide Risk Assessment (Signed)
[facility] Discharge Suicide Risk Assessment   Principal Problem: Bipolar affective disorder, currently depressed, moderate (HCC) Discharge Diagnoses: Principal Problem:   Bipolar affective disorder, currently depressed, moderate (HCC) Active Problems:   Major depressive disorder, recurrent episode, severe (HCC)   Musculoskeletal: Strength & Muscle Tone: within normal limits Gait & Station: normal Patient leans: N/A   Psychiatric Specialty Exam:   Presentation  General Appearance:  Appropriate for Environment   Eye Contact: Good   Speech: Clear and Coherent; Normal Rate   Speech Volume: Normal   Handedness: Right     Mood and Affect  Mood: Good   Affect: Congruent     Thought Process  Thought Processes: Coherent; Goal Directed; Linear   Descriptions of Associations:Intact   Orientation:Full (Time, Place and Person)   Thought Content:Logical   Hallucinations:Denies Ideas of Reference:None   Suicidal Thoughts:Denies Homicidal Thoughts:Denies   Sensorium  Memory: Immediate Good   Judgment: Intact   Insight: Present     Executive Functions  Concentration: Fair   Attention Span: Fair   Recall: [employer] of Knowledge: Fair   Language: Fair     Psychomotor Activity  Psychomotor Activity:Normal   Assets  Assets: Manufacturing systems engineer; Desire for Improvement; Financial Resources/Insurance; Housing; Leisure Time; Resilience; Social Support     Sleep  Sleep:Improved  Physical Exam Constitutional:      General: He is not in acute distress.    Appearance: He is not ill-appearing, toxic-appearing or diaphoretic.  Eyes:     General: No scleral icterus. Cardiovascular:     Rate and Rhythm: Normal rate.  Pulmonary:     Effort: Pulmonary effort is normal. No respiratory distress.  Neurological:     Mental Status: He is alert and oriented to person, place, and time.      Review of Systems  Constitutional:  Negative for chills and  fever.  Respiratory:  Negative for shortness of breath.   Cardiovascular:  Negative for chest pain and palpitations.  Gastrointestinal:  Negative for abdominal pain.  Neurological:  Negative for headaches.     Blood pressure 125/88, pulse 77, temperature (!) 97.3 F (36.3 C), resp. rate 17, height 6\' 2"  (1.88 m), weight 83 kg, SpO2 97%. Body mass index is 23.5 kg/m.    Demographic Factors:  Male, Low socioeconomic status, and Unemployed  Loss Factors: Decrease in vocational status, Decline in physical health, and Financial problems/change in socioeconomic status  Historical Factors: Impulsivity  Risk Reduction Factors:   Positive social support, Positive therapeutic relationship, and Positive coping skills or problem solving skills  Continued Clinical Symptoms:  Previous Psychiatric Diagnoses and Treatments Medical Diagnoses and Treatments/Surgeries   Suicide Risk:  Minimal: No identifiable suicidal ideation.    Plan Of Care/Follow-up recommendations:  Per Discharge summary  Lewanda Rife, MD 11/01/2022, 10:12 AM

## 2022-11-01 NOTE — Group Note (Signed)
Recreation Therapy Group Note   Group Topic:Goal Setting  Group Date: 11/01/2022 Start Time: 1015 End Time: 1115 Facilitators: Rosina Lowenstein, LRT, CTRS Location:  Craft Room  Group description: Now Future Wall. Patients were given a sheet of paper and asked to fold it into 3 sections, like a pamphlet. Top section, patients were encouraged to write what they are feeling or experiencing "now". The bottom section, patients were asked to fill out how they want to feel or things they want to experience in the "future".  In the middle section, patients were encouraged to fill out any "walls" or barriers that are getting in the way of them reaching their "future". On the back of the sheet, patients were encouraged to write positive coping skills that will help them get over or though the walls they experience. LRT and patients discussed each of the sections and shared them aloud in group. Patients are encouraged to keep this paper with them as a guide/plan post discharge.   Goal Area(s) Addressed:  Patients will identify walls/triggers. Patients will identify and list coping skills to use post-discharge. Patients will work on goal setting, short or long-term. Patients will work on communication by reading aloud to group and engaging in post activity discussion.   Affect/Mood: N/A   Participation Level: Did not attend    Clinical Observations/Individualized Feedback: Michael Schmitt did not attend group.  Plan: Continue to engage patient in RT group sessions 2-3x/week.   Rosina Lowenstein, LRT, CTRS 11/01/2022 12:05 PM

## 2022-11-01 NOTE — Progress Notes (Signed)
Veteran requesting medication for nausea, message sent to on NP, awaiting new orders.

## 2022-11-01 NOTE — Plan of Care (Signed)
  Problem: Education: Goal: Emotional status will improve Outcome: Progressing   Problem: Activity: Goal: Sleeping patterns will improve Outcome: Not Progressing   Problem: Safety: Goal: Ability to remain free from injury will improve Outcome: Progressing

## 2022-11-07 ENCOUNTER — Other Ambulatory Visit: Payer: Self-pay

## 2022-11-07 ENCOUNTER — Emergency Department
Admission: EM | Admit: 2022-11-07 | Discharge: 2022-11-07 | Disposition: A | Discharge status: Home / Self Care | Payer: 59 | Attending: Emergency Medicine | Admitting: Emergency Medicine

## 2022-11-07 DIAGNOSIS — E119 Type 2 diabetes mellitus without complications: Secondary | ICD-10-CM | POA: Diagnosis not present

## 2022-11-07 DIAGNOSIS — Y92003 Bedroom of unspecified non-institutional (private) residence as the place of occurrence of the external cause: Secondary | ICD-10-CM | POA: Diagnosis not present

## 2022-11-07 DIAGNOSIS — Z760 Encounter for issue of repeat prescription: Secondary | ICD-10-CM | POA: Insufficient documentation

## 2022-11-07 DIAGNOSIS — R531 Weakness: Secondary | ICD-10-CM | POA: Insufficient documentation

## 2022-11-07 DIAGNOSIS — W19XXXA Unspecified fall, initial encounter: Secondary | ICD-10-CM

## 2022-11-07 DIAGNOSIS — W1839XA Other fall on same level, initial encounter: Secondary | ICD-10-CM | POA: Insufficient documentation

## 2022-11-07 LAB — BASIC METABOLIC PANEL
Anion gap: 10 (ref 5–15)
BUN: 16 mg/dL (ref 6–20)
CO2: 21 mmol/L — ABNORMAL LOW (ref 22–32)
Calcium: 9 mg/dL (ref 8.9–10.3)
Chloride: 106 mmol/L (ref 98–111)
Creatinine, Ser: 0.74 mg/dL (ref 0.61–1.24)
GFR, Estimated: >60 mL/min (ref >60)
Glucose, Bld: 93 mg/dL (ref 70–99)
Potassium: 3.8 mmol/L (ref 3.5–5.1)
Sodium: 137 mmol/L (ref 135–145)

## 2022-11-07 LAB — URINALYSIS, ROUTINE W REFLEX MICROSCOPIC
Bilirubin Urine: NEGATIVE
Glucose, UA: NEGATIVE mg/dL
Hgb urine dipstick: NEGATIVE
Ketones, ur: NEGATIVE mg/dL
Nitrite: NEGATIVE
Protein, ur: NEGATIVE mg/dL
Specific Gravity, Urine: 1.006 (ref 1.005–1.030)
pH: 6 (ref 5.0–8.0)

## 2022-11-07 LAB — CBC
HCT: 40.5 % (ref 39.0–52.0)
Hemoglobin: 13.1 g/dL (ref 13.0–17.0)
MCH: 29 pg (ref 26.0–34.0)
MCHC: 32.3 g/dL (ref 30.0–36.0)
MCV: 89.6 fL (ref 80.0–100.0)
Platelets: 233 10*3/uL (ref 150–400)
RBC: 4.52 MIL/uL (ref 4.22–5.81)
RDW: 14.5 % (ref 11.5–15.5)
WBC: 7.6 10*3/uL (ref 4.0–10.5)
nRBC: 0 % (ref 0.0–0.2)

## 2022-11-07 MED ORDER — HYDROXYZINE HCL 25 MG PO TABS
50.0000 mg | ORAL_TABLET | Freq: Once | ORAL | Status: AC
  Administered 2022-11-07: 50 mg via ORAL
  Filled 2022-11-07: qty 2

## 2022-11-07 MED ORDER — HYDROXYZINE HCL 50 MG PO TABS: 50.0000 mg | ORAL_TABLET | Freq: Three times a day (TID) | ORAL | 0 refills | Status: AC | PRN

## 2022-11-07 NOTE — Discharge Instructions (Addendum)
Your behavioral medicine team in the hospital planned the following follow ups: Follow-Ups  Follow up with Michael Schmitt; Your appointment is scheduled for Monday, 11/13/22 at 10:45AM. Thanks! Follow up with Michael Schmitt [facility] [place] [employer] Referral was made for [employer]. Follow up with them directly regarding decision. Thanks!

## 2022-11-07 NOTE — ED Provider Notes (Signed)
[facility] Provider Note    Event Date/Time   First MD Initiated Contact with Patient 11/07/22 2017     (approximate)   History   Chief Complaint: Weakness   HPI  Michael Schmitt is a 52 y.o. male with a history of substance abuse, anxiety, diabetes who comes ED complaining of a fall at home.  States that he fell onto his bed in his bedroom without injury.  He did not lose consciousness.  No urinary incontinence or confusion afterward.  Denies any pain, reports eating and drinking normally.  No exertional symptoms.  Otherwise feels fine.  Requesting refill of hydroxyzine  Was recently hospitalized with behavioral medicine.  Outside records reviewed noting a change in his medication regimen including Seroquel, Zyprexa, Requip, Vistaril, Flexeril, Cymbalta.     Physical Exam   Triage Vital Signs: ED Triage Vitals  Encounter Vitals Group     BP 11/07/22 1833 (!) 124/93     Systolic BP Percentile --      Diastolic BP Percentile --      Pulse Rate 11/07/22 1833 96     Resp 11/07/22 1833 18     Temp 11/07/22 1833 98 F (36.7 C)     Temp Source 11/07/22 1833 Oral     SpO2 11/07/22 1833 95 %     Weight 11/07/22 1837 195 lb (88.5 kg)     Height 11/07/22 1837 6\' 2"  (1.88 m)     Head Circumference --      Peak Flow --      Pain Score 11/07/22 1836 0     Pain Loc --      Pain Education --      Exclude from Growth Chart --     Most recent vital signs: Vitals:   11/07/22 1833  BP: (!) 124/93  Pulse: 96  Resp: 18  Temp: 98 F (36.7 C)  SpO2: 95%    General: Awake, no distress.  CV:  Good peripheral perfusion.  Regular rate and rhythm Resp:  Normal effort.  Clear to auscultation bilaterally Abd:  No distention.  Soft nontender Other:  Cranial nerves III through XII intact.  Moving all extremities, normal coordination and balance.    ED Results / Procedures / Treatments   Labs (all labs ordered are listed, but only abnormal results are  displayed) Labs Reviewed  BASIC METABOLIC PANEL - Abnormal; Notable for the following components:      Result Value   CO2 21 (*)    All other components within normal limits  URINALYSIS, ROUTINE W REFLEX MICROSCOPIC - Abnormal; Notable for the following components:   Color, Urine STRAW (*)    APPearance CLEAR (*)    Leukocytes,Ua TRACE (*)    Bacteria, UA RARE (*)    All other components within normal limits  CBC  CBG MONITORING, ED     EKG Interpreted by me Normal sinus rhythm rate of 100.  Right axis, right bundle branch block.  No acute ischemic changes.   RADIOLOGY    PROCEDURES:  Procedures   MEDICATIONS ORDERED IN ED: Medications - No data to display   IMPRESSION / MDM / ASSESSMENT AND PLAN / ED COURSE  I reviewed the triage vital signs and the nursing notes.  DDx: Anxiety, EPS versus TD, electrolyte abnormality, anemia, fatigue, dehydration.  Patient's presentation is most consistent with acute presentation with potential threat to life or bodily function.  Patient presents with a fall and feeling fatigued.  Other acute symptoms, nothing focal on symptomatology or exam.  Vital signs are normal.  Labs are normal.  He is nontoxic alert and oriented.  Doubt stroke or acute traumatic injury.  Doubt neuroleptic malignant syndrome or serotonin syndrome.  Will refill hydroxyzine.  I note that he has referrals to [facility] with an appointment in 6 days and referral for ACT team as well.  Encourage patient to continue follow-up.       FINAL CLINICAL IMPRESSION(S) / ED DIAGNOSES   Final diagnoses:  Fall, initial encounter     Rx / DC Orders   ED Discharge Orders          Ordered    hydrOXYzine (ATARAX) 50 MG tablet  3 times daily PRN        11/07/22 2031             Note:  This document was prepared using Dragon voice recognition software and may include unintentional dictation errors.   Sharman Cheek, MD 11/07/22 2037

## 2022-11-07 NOTE — ED Triage Notes (Signed)
Pt presents to ED with c/o of weakness. Pt states started today. Pt is A&Ox4. NAD noted.

## 2022-11-07 NOTE — ED Notes (Signed)
Pt stated he had to go to the bathroom--urine cup was given and pt stated he could not urinate. Pt movements/ambulation and speech are erratic

## 2022-11-26 ENCOUNTER — Other Ambulatory Visit: Payer: Self-pay

## 2022-11-26 ENCOUNTER — Encounter: Payer: Self-pay | Admitting: Emergency Medicine

## 2022-11-26 ENCOUNTER — Emergency Department
Admission: EM | Admit: 2022-11-26 | Discharge: 2022-11-26 | Discharge status: Against Medical Advice | Payer: 59 | Attending: Emergency Medicine | Admitting: Emergency Medicine

## 2022-11-26 DIAGNOSIS — R519 Headache, unspecified: Secondary | ICD-10-CM | POA: Diagnosis not present

## 2022-11-26 DIAGNOSIS — F1310 Sedative, hypnotic or anxiolytic abuse, uncomplicated: Secondary | ICD-10-CM | POA: Diagnosis not present

## 2022-11-26 DIAGNOSIS — Z5321 Procedure and treatment not carried out due to patient leaving prior to being seen by health care provider: Secondary | ICD-10-CM | POA: Diagnosis not present

## 2022-11-26 DIAGNOSIS — Y900 Blood alcohol level of less than 20 mg/100 ml: Secondary | ICD-10-CM | POA: Diagnosis not present

## 2022-11-26 DIAGNOSIS — R45851 Suicidal ideations: Secondary | ICD-10-CM | POA: Diagnosis not present

## 2022-11-26 DIAGNOSIS — R112 Nausea with vomiting, unspecified: Secondary | ICD-10-CM | POA: Insufficient documentation

## 2022-11-26 DIAGNOSIS — R109 Unspecified abdominal pain: Secondary | ICD-10-CM | POA: Diagnosis not present

## 2022-11-26 DIAGNOSIS — F1010 Alcohol abuse, uncomplicated: Secondary | ICD-10-CM | POA: Insufficient documentation

## 2022-11-26 LAB — CBC
HCT: 40.7 % (ref 39.0–52.0)
Hemoglobin: 13 g/dL (ref 13.0–17.0)
MCH: 28.1 pg (ref 26.0–34.0)
MCHC: 31.9 g/dL (ref 30.0–36.0)
MCV: 88.1 fL (ref 80.0–100.0)
Platelets: 280 10*3/uL (ref 150–400)
RBC: 4.62 MIL/uL (ref 4.22–5.81)
RDW: 14.8 % (ref 11.5–15.5)
WBC: 7.6 10*3/uL (ref 4.0–10.5)
nRBC: 0 % (ref 0.0–0.2)

## 2022-11-26 LAB — URINE DRUG SCREEN, QUALITATIVE (ARMC ONLY)
Amphetamines, Ur Screen: NOT DETECTED
Barbiturates, Ur Screen: NOT DETECTED
Benzodiazepine, Ur Scrn: NOT DETECTED
Cannabinoid 50 Ng, Ur ~~LOC~~: NOT DETECTED
Cocaine Metabolite,Ur ~~LOC~~: NOT DETECTED
MDMA (Ecstasy)Ur Screen: NOT DETECTED
Methadone Scn, Ur: NOT DETECTED
Opiate, Ur Screen: NOT DETECTED
Phencyclidine (PCP) Ur S: NOT DETECTED
Tricyclic, Ur Screen: POSITIVE — AB

## 2022-11-26 LAB — COMPREHENSIVE METABOLIC PANEL
ALT: 19 U/L (ref 0–44)
AST: 20 U/L (ref 15–41)
Albumin: 4.2 g/dL (ref 3.5–5.0)
Alkaline Phosphatase: 91 U/L (ref 38–126)
Anion gap: 10 (ref 5–15)
BUN: 11 mg/dL (ref 6–20)
CO2: 20 mmol/L — ABNORMAL LOW (ref 22–32)
Calcium: 8.7 mg/dL — ABNORMAL LOW (ref 8.9–10.3)
Chloride: 107 mmol/L (ref 98–111)
Creatinine, Ser: 0.71 mg/dL (ref 0.61–1.24)
GFR, Estimated: >60 mL/min (ref >60)
Glucose, Bld: 261 mg/dL — ABNORMAL HIGH (ref 70–99)
Potassium: 3.9 mmol/L (ref 3.5–5.1)
Sodium: 137 mmol/L (ref 135–145)
Total Bilirubin: 0.4 mg/dL (ref 0.3–1.2)
Total Protein: 7.2 g/dL (ref 6.5–8.1)

## 2022-11-26 LAB — SALICYLATE LEVEL: Salicylate Lvl: <7 mg/dL — ABNORMAL LOW (ref 7.0–30.0)

## 2022-11-26 LAB — ETHANOL: Alcohol, Ethyl (B): <10 mg/dL (ref <10)

## 2022-11-26 LAB — ACETAMINOPHEN LEVEL: Acetaminophen (Tylenol), Serum: <10 ug/mL — ABNORMAL LOW (ref 10–30)

## 2022-11-26 NOTE — ED Notes (Signed)
Patient calm at this time

## 2022-11-26 NOTE — ED Notes (Addendum)
Patient took belongings and put his personal clothes back on. Patient verbalizing he is going to leave. MD Vicente Masson, charge RN made aware. Patient using phone in lobby at this time to call ride

## 2022-11-26 NOTE — ED Notes (Signed)
Patient had episode of yelling and pacing in middle of triage. Patient turning red and flexing arms while speaking loudly. Patient voiced he is ready to be in a room. Security called.

## 2022-11-26 NOTE — ED Triage Notes (Signed)
Pt in via [facility], states, "I have been drinking since about 3 oclock yesterday."  Complaints of headache, abdominal pain w/ N/V, and suicidal ideation since the binging.   Cooperative in triage at this time, NAD noted.

## 2022-11-26 NOTE — ED Notes (Addendum)
Pt dressed out in triage the following items are in his pt belonging bag:   Blue shoes Blue jean shorts  Brown belt [employer]  Camo watch 1 green pack of cigs called 24/7 3 dollars in change  2 $20 dollar bill and 2 $1.00 bills total of $42.00

## 2023-03-07 ENCOUNTER — Other Ambulatory Visit: Payer: Self-pay | Admitting: Physical Medicine & Rehabilitation

## 2023-03-07 DIAGNOSIS — M5412 Radiculopathy, cervical region: Secondary | ICD-10-CM

## 2023-03-29 NOTE — Discharge Instructions (Signed)
 Post Procedure Spinal Discharge Instruction Sheet  You may resume a regular diet and any medications that you routinely take (including pain medications) unless otherwise noted by MD.  No driving day of procedure.  Light activity throughout the rest of the day.  Do not do any strenuous work, exercise, bending or lifting.  The day following the procedure, you can resume normal physical activity but you should refrain from exercising or physical therapy for at least three days thereafter.  You may apply ice to the injection site, 20 minutes on, 20 minutes off, as needed. Do not apply ice directly to skin.    Common Side Effects:  Headaches- take your usual medications as directed by your physician.  Increase your fluid intake.  Caffeinated beverages may be helpful.  Lie flat in bed until your headache resolves.  Restlessness or inability to sleep- you may have trouble sleeping for the next few days.  Ask your referring physician if you need any medication for sleep.  Facial flushing or redness- should subside within a few days.  Increased pain- a temporary increase in pain a day or two following your procedure is not unusual.  Take your pain medication as prescribed by your referring physician.  Leg cramps  Please contact our office at [phone] for the following symptoms: Fever greater than 100 degrees. Headaches unresolved with medication after 2-3 days. Increased swelling, pain, or redness at injection site.  May resume immediately after injection. Thank you for visiting [facility] Today!

## 2023-03-30 ENCOUNTER — Ambulatory Visit
Admission: RE | Admit: 2023-03-30 | Discharge: 2023-03-30 | Disposition: A | Discharge status: Home / Self Care | Payer: 59 | Source: Ambulatory Visit | Attending: Physical Medicine & Rehabilitation | Admitting: Physical Medicine & Rehabilitation

## 2023-03-30 DIAGNOSIS — M5412 Radiculopathy, cervical region: Secondary | ICD-10-CM

## 2023-03-30 MED ORDER — IOPAMIDOL (ISOVUE-M 300) INJECTION 61%
1.0000 mL | Freq: Once | INTRAMUSCULAR | Status: AC | PRN
  Administered 2023-03-30: 1 mL via EPIDURAL

## 2023-03-30 MED ORDER — TRIAMCINOLONE ACETONIDE 40 MG/ML IJ SUSP (RADIOLOGY)
60.0000 mg | Freq: Once | INTRAMUSCULAR | Status: AC
  Administered 2023-03-30: 60 mg via EPIDURAL

## 2023-04-25 ENCOUNTER — Other Ambulatory Visit: Payer: Self-pay | Admitting: Physical Medicine & Rehabilitation

## 2023-04-25 DIAGNOSIS — M5412 Radiculopathy, cervical region: Secondary | ICD-10-CM

## 2023-05-02 ENCOUNTER — Ambulatory Visit
Admission: RE | Admit: 2023-05-02 | Discharge: 2023-05-02 | Disposition: A | Discharge status: Home / Self Care | Payer: 59 | Source: Ambulatory Visit | Attending: Physical Medicine & Rehabilitation | Admitting: Physical Medicine & Rehabilitation

## 2023-05-02 ENCOUNTER — Emergency Department
Admission: EM | Admit: 2023-05-02 | Discharge: 2023-05-02 | Discharge status: Against Medical Advice | Payer: Medicare HMO

## 2023-05-02 DIAGNOSIS — M5412 Radiculopathy, cervical region: Secondary | ICD-10-CM

## 2023-05-02 MED ORDER — IOPAMIDOL (ISOVUE-M 300) INJECTION 61%
1.0000 mL | Freq: Once | INTRAMUSCULAR | Status: AC | PRN
  Administered 2023-05-02: 1 mL via EPIDURAL

## 2023-05-02 MED ORDER — TRIAMCINOLONE ACETONIDE 40 MG/ML IJ SUSP (RADIOLOGY)
60.0000 mg | Freq: Once | INTRAMUSCULAR | Status: AC
  Administered 2023-05-02: 60 mg via EPIDURAL

## 2023-05-02 NOTE — ED Notes (Signed)
 No answer when called several times from lobby
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
## 2023-05-02 NOTE — Discharge Instructions (Signed)
Post Procedure Spinal Discharge Instruction Sheet  You may resume a regular diet and any medications that you routinely take (including pain medications) unless otherwise noted by MD.  No driving day of procedure.  Light activity throughout the rest of the day.  Do not do any strenuous work, exercise, bending or lifting.  The day following the procedure, you can resume normal physical activity but you should refrain from exercising or physical therapy for at least three days thereafter.  You may apply ice to the injection site, 20 minutes on, 20 minutes off, as needed. Do not apply ice directly to skin.    Common Side Effects:  Headaches- take your usual medications as directed by your physician.  Increase your fluid intake.  Caffeinated beverages may be helpful.  Lie flat in bed until your headache resolves.  Restlessness or inability to sleep- you may have trouble sleeping for the next few days.  Ask your referring physician if you need any medication for sleep.  Facial flushing or redness- should subside within a few days.  Increased pain- a temporary increase in pain a day or two following your procedure is not unusual.  Take your pain medication as prescribed by your referring physician.  Leg cramps  Please contact our office at [phone] for the following symptoms: Fever greater than 100 degrees. Headaches unresolved with medication after 2-3 days. Increased swelling, pain, or redness at injection site.   Thank you for visiting [facility] today.   You may resume your plavix anytime after procedure today

## 2023-05-10 ENCOUNTER — Other Ambulatory Visit: Payer: Self-pay

## 2023-05-10 ENCOUNTER — Emergency Department
Admission: EM | Admit: 2023-05-10 | Discharge: 2023-05-10 | Disposition: A | Discharge status: Home / Self Care | Payer: Medicare HMO | Attending: Emergency Medicine | Admitting: Emergency Medicine

## 2023-05-10 DIAGNOSIS — I129 Hypertensive chronic kidney disease with stage 1 through stage 4 chronic kidney disease, or unspecified chronic kidney disease: Secondary | ICD-10-CM | POA: Insufficient documentation

## 2023-05-10 DIAGNOSIS — E119 Type 2 diabetes mellitus without complications: Secondary | ICD-10-CM | POA: Insufficient documentation

## 2023-05-10 DIAGNOSIS — N189 Chronic kidney disease, unspecified: Secondary | ICD-10-CM | POA: Insufficient documentation

## 2023-05-10 DIAGNOSIS — M542 Cervicalgia: Secondary | ICD-10-CM | POA: Diagnosis present

## 2023-05-10 DIAGNOSIS — I2510 Atherosclerotic heart disease of native coronary artery without angina pectoris: Secondary | ICD-10-CM | POA: Insufficient documentation

## 2023-05-10 MED ORDER — MELOXICAM 15 MG PO TABS: 15.0000 mg | ORAL_TABLET | Freq: Every day | ORAL | 0 refills | Status: AC

## 2023-05-10 MED ORDER — LIDOCAINE 5 % EX PTCH
1.0000 | MEDICATED_PATCH | CUTANEOUS | Status: DC
  Administered 2023-05-10: 1 via TRANSDERMAL
  Filled 2023-05-10: qty 1

## 2023-05-10 MED ORDER — KETOROLAC TROMETHAMINE 30 MG/ML IJ SOLN
30.0000 mg | Freq: Once | INTRAMUSCULAR | Status: AC
  Administered 2023-05-10: 30 mg via INTRAMUSCULAR
  Filled 2023-05-10: qty 1

## 2023-05-10 NOTE — ED Triage Notes (Signed)
Pt brought in via [facility] from home with neck pain.  Pt reports fusion in 2015.  Pt continues to have pain in neck.  Pt reports falling this am.  Pt alert

## 2023-05-10 NOTE — ED Notes (Signed)
First Nurse Note: Pt to ED via [facility] from home for neck pain and dizziness. Pt states that he has been drinking for the last 4 days. Pt states that he had a fall 3 hours ago. Pt also c/o anxiety.

## 2023-05-10 NOTE — Discharge Instructions (Addendum)
Alternate taking Tylenol and meloxicam.  Apply a heated pad or blanket over the affected area.  Follow-up with neurology for your scheduled appointment in March, call for sooner visit.

## 2023-05-10 NOTE — ED Notes (Signed)
See triage notes. Patient c/o neck pain. Patient had a fusion in 2015. Patient reports a fall this morning.

## 2023-05-10 NOTE — ED Provider Notes (Signed)
[facility] Emergency Department Provider Note     Event Date/Time   First MD Initiated Contact with Patient 05/10/23 1812     (approximate)   History   Neck Pain   HPI  Michael Schmitt is a 53 y.o. male with a history of HTN, cervicogenic headache, HLD, diabetes, CAD and CKD presents to the ED for evaluation of right sided neck pain x 2 days.  Patient has chronic neck pain.  He reports the pain begins at the base of his neck and radiates up to his forehead.  Denies vomiting, visual changes or any injury.  Patient has tried [place] powder and Tylenol with no relief.     Physical Exam   Triage Vital Signs: ED Triage Vitals [05/10/23 1559]  Encounter Vitals Group     BP (!) 154/99     Systolic BP Percentile      Diastolic BP Percentile      Pulse Rate 90     Resp 20     Temp 98.6 F (37 C)     Temp Source Oral     SpO2 97 %     Weight 193 lb (87.5 kg)     Height 6\' 2"  (1.88 m)     Head Circumference      Peak Flow      Pain Score 8     Pain Loc      Pain Education      Exclude from Growth Chart     Most recent vital signs: Vitals:   05/10/23 1559  BP: (!) 154/99  Pulse: 90  Resp: 20  Temp: 98.6 F (37 C)  SpO2: 97%    General: Alert and oriented. INAD.  Skin:  Warm, dry and intact. No rashes or lesions noted.     Head:  NCAT.  Eyes:  PERRLA. EOMI.  Neck:   No cervical spine tenderness to palpation. Full ROM without difficulty. (-) Spurling's test. CV:  Good peripheral perfusion. RRR.  RESP:  Normal effort. LCTAB.  NEURO: Cranial nerves II-XII intact. No focal deficits. Sensation and motor function intact. 5/5 muscle strength of UE & LE. Gait is steady.   ED Results / Procedures / Treatments   Labs (all labs ordered are listed, but only abnormal results are displayed) Labs Reviewed - No data to display  No results found.  PROCEDURES:  Critical Care performed: No  Procedures   MEDICATIONS ORDERED IN ED: Medications   lidocaine (LIDODERM) 5 % 1 patch (1 patch Transdermal Patch Applied 05/10/23 1931)  ketorolac (TORADOL) 30 MG/ML injection 30 mg (30 mg Intramuscular Given 05/10/23 1931)     IMPRESSION / MDM / ASSESSMENT AND PLAN / ED COURSE  I reviewed the triage vital signs and the nursing notes.                               53 y.o. male presents to the emergency department for evaluation and treatment of acute on chronic neck pain without any injury. See HPI for further details.   Differential diagnosis includes, but is not limited to torticollis, radiculopathy, strain  Patient's presentation is most consistent with acute complicated illness / injury requiring diagnostic workup.  Patient is alert and oriented.  He is hemodynamically stable.  Physical exam findings are stated above.  Neuroexam no indication for imaging at this time.  No back red flag signs.  ED management with Toradol  and lidocaine patch.  Patient has an appointment with neurology in March for further evaluation.  Patient is requesting for a stronger medication.  He is requesting Percocet but discussed with the patient that I did not feel comfortable prescribing a narcotic for his presentation today.  I did advise him to follow-up with his primary and neurologist for further evaluation.  Will prescribe meloxicam.  The patient is in stable and satisfactory condition for discharge home.  ED return precautions discussed.  FINAL CLINICAL IMPRESSION(S) / ED DIAGNOSES   Final diagnoses:  Neck pain   Rx / DC Orders   ED Discharge Orders          Ordered    meloxicam (MOBIC) 15 MG tablet  Daily        05/10/23 1928             Note:  This document was prepared using Dragon voice recognition software and may include unintentional dictation errors.    Romeo Apple, Dollene Mallery A, PA-C 05/10/23 2253    Minna Antis, MD 05/10/23 2300

## 2023-07-31 ENCOUNTER — Ambulatory Visit: Admitting: Pediatrics

## 2023-09-13 ENCOUNTER — Other Ambulatory Visit: Payer: Self-pay | Admitting: Physical Medicine & Rehabilitation

## 2023-09-13 DIAGNOSIS — M5412 Radiculopathy, cervical region: Secondary | ICD-10-CM

## 2023-09-14 NOTE — Discharge Instructions (Signed)

## 2023-09-17 ENCOUNTER — Inpatient Hospital Stay
Admission: RE | Admit: 2023-09-17 | Discharge: 2023-09-17 | Disposition: A | Discharge status: Home / Self Care | Source: Ambulatory Visit | Attending: Physical Medicine & Rehabilitation | Admitting: Physical Medicine & Rehabilitation

## 2023-09-17 NOTE — Discharge Instructions (Signed)

## 2023-09-18 ENCOUNTER — Ambulatory Visit
Admission: RE | Admit: 2023-09-18 | Discharge: 2023-09-18 | Disposition: A | Discharge status: Home / Self Care | Source: Ambulatory Visit | Attending: Physical Medicine & Rehabilitation | Admitting: Physical Medicine & Rehabilitation

## 2023-09-18 DIAGNOSIS — M5412 Radiculopathy, cervical region: Secondary | ICD-10-CM

## 2023-09-18 MED ORDER — IOPAMIDOL (ISOVUE-M 300) INJECTION 61%
1.0000 mL | Freq: Once | INTRAMUSCULAR | Status: AC | PRN
  Administered 2023-09-18: 1 mL via EPIDURAL

## 2023-09-18 MED ORDER — TRIAMCINOLONE ACETONIDE 40 MG/ML IJ SUSP (RADIOLOGY)
60.0000 mg | Freq: Once | INTRAMUSCULAR | Status: AC
Start: 2023-09-18 — End: 2023-09-18
  Administered 2023-09-18: 60 mg via EPIDURAL

## 2023-09-20 ENCOUNTER — Other Ambulatory Visit

## 2023-10-04 ENCOUNTER — Ambulatory Visit (INDEPENDENT_AMBULATORY_CARE_PROVIDER_SITE_OTHER): Admitting: Pediatrics

## 2023-10-04 ENCOUNTER — Other Ambulatory Visit: Payer: Self-pay

## 2023-10-04 ENCOUNTER — Encounter: Payer: Self-pay | Admitting: Pediatrics

## 2023-10-04 ENCOUNTER — Telehealth: Payer: Self-pay | Admitting: Pediatrics

## 2023-10-04 VITALS — BP 118/70 | HR 64 | Temp 97.9°F | Ht 73.0 in | Wt 183.8 lb

## 2023-10-04 DIAGNOSIS — R195 Other fecal abnormalities: Secondary | ICD-10-CM

## 2023-10-04 DIAGNOSIS — F5104 Psychophysiologic insomnia: Secondary | ICD-10-CM

## 2023-10-04 DIAGNOSIS — R110 Nausea: Secondary | ICD-10-CM

## 2023-10-04 DIAGNOSIS — Z7689 Persons encountering health services in other specified circumstances: Secondary | ICD-10-CM

## 2023-10-04 DIAGNOSIS — G894 Chronic pain syndrome: Secondary | ICD-10-CM | POA: Diagnosis not present

## 2023-10-04 DIAGNOSIS — M792 Neuralgia and neuritis, unspecified: Secondary | ICD-10-CM | POA: Diagnosis not present

## 2023-10-04 DIAGNOSIS — E119 Type 2 diabetes mellitus without complications: Secondary | ICD-10-CM | POA: Diagnosis not present

## 2023-10-04 DIAGNOSIS — F332 Major depressive disorder, recurrent severe without psychotic features: Secondary | ICD-10-CM

## 2023-10-04 DIAGNOSIS — Z1330 Encounter for screening examination for mental health and behavioral disorders, unspecified: Secondary | ICD-10-CM

## 2023-10-04 MED ORDER — ESZOPICLONE 1 MG PO TABS: 1.0000 mg | ORAL_TABLET | Freq: Every evening | ORAL | 2 refills | Status: DC | PRN

## 2023-10-04 MED ORDER — PREGABALIN 25 MG PO CAPS: 25.0000 mg | ORAL_CAPSULE | Freq: Two times a day (BID) | ORAL | 1 refills | Status: DC

## 2023-10-04 MED ORDER — ONDANSETRON HCL 4 MG PO TABS: 4.0000 mg | ORAL_TABLET | Freq: Three times a day (TID) | ORAL | 0 refills | Status: DC | PRN

## 2023-10-04 MED ORDER — AMITRIPTYLINE HCL 50 MG PO TABS: 150.0000 mg | ORAL_TABLET | Freq: Every day | ORAL | 2 refills | Status: DC

## 2023-10-04 NOTE — Telephone Encounter (Signed)
 Resent rx from today  Hadassah SHAUNNA Nett, MD

## 2023-10-04 NOTE — Telephone Encounter (Signed)
 Copied from CRM [id number]. Topic: Clinical - Prescription Issue >> Oct 04, 2023  1:22 PM Santiya F wrote: Reason for CRM: Patient is calling in because his prescription ondansetron  (ZOFRAN ) 4 MG tablet [[id number]] was supposed to be sent to [facility] on [address] and it was sent to [facility], he is requesting it be transferred to [facility].

## 2023-10-04 NOTE — Patient Instructions (Addendum)
 For sleep and pain: amitriptyline  50mg , lunesta  1mg   For pain: lyrica  25mg  twice daily  I sent zofran  for nausea
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
## 2023-10-05 ENCOUNTER — Ambulatory Visit: Payer: Self-pay | Admitting: *Deleted

## 2023-10-05 LAB — CBC WITH DIFFERENTIAL/PLATELET
Basophils Absolute: 0 x10E3/uL (ref 0.0–0.2)
Basos: 0 %
EOS (ABSOLUTE): 0.1 x10E3/uL (ref 0.0–0.4)
Eos: 1 %
Hematocrit: 38.7 % (ref 37.5–51.0)
Hemoglobin: 12.6 g/dL — ABNORMAL LOW (ref 13.0–17.7)
Immature Grans (Abs): 0.1 x10E3/uL (ref 0.0–0.1)
Immature Granulocytes: 1 %
Lymphocytes Absolute: 1.8 x10E3/uL (ref 0.7–3.1)
Lymphs: 20 %
MCH: 27.9 pg (ref 26.6–33.0)
MCHC: 32.6 g/dL (ref 31.5–35.7)
MCV: 86 fL (ref 79–97)
Monocytes Absolute: 0.5 x10E3/uL (ref 0.1–0.9)
Monocytes: 5 %
Neutrophils Absolute: 6.5 x10E3/uL (ref 1.4–7.0)
Neutrophils: 73 %
Platelets: 212 x10E3/uL (ref 150–450)
RBC: 4.52 x10E6/uL (ref 4.14–5.80)
RDW: 14.8 % (ref 11.6–15.4)
WBC: 8.9 x10E3/uL (ref 3.4–10.8)

## 2023-10-05 LAB — COMPREHENSIVE METABOLIC PANEL WITH GFR
ALT: 13 IU/L (ref 0–44)
AST: 17 IU/L (ref 0–40)
Albumin: 3.8 g/dL — ABNORMAL LOW (ref 4.1–5.1)
Alkaline Phosphatase: 125 IU/L — ABNORMAL HIGH (ref 44–121)
BUN/Creatinine Ratio: 18 (ref 9–20)
BUN: 16 mg/dL (ref 6–24)
Bilirubin Total: <0.2 mg/dL (ref 0.0–1.2)
CO2: 18 mmol/L — ABNORMAL LOW (ref 20–29)
Calcium: 8.9 mg/dL (ref 8.7–10.2)
Chloride: 106 mmol/L (ref 96–106)
Creatinine, Ser: 0.9 mg/dL (ref 0.76–1.27)
Globulin, Total: 2.1 g/dL (ref 1.5–4.5)
Glucose: 132 mg/dL — ABNORMAL HIGH (ref 70–99)
Potassium: 4.3 mmol/L (ref 3.5–5.2)
Sodium: 139 mmol/L (ref 134–144)
Total Protein: 5.9 g/dL — ABNORMAL LOW (ref 6.0–8.5)
eGFR: 104 mL/min/1.73 (ref >59)

## 2023-10-05 LAB — LIPID PANEL
Chol/HDL Ratio: 4.2 ratio (ref 0.0–5.0)
Cholesterol, Total: 168 mg/dL (ref 100–199)
HDL: 40 mg/dL (ref >39)
LDL Chol Calc (NIH): 94 mg/dL (ref 0–99)
Triglycerides: 201 mg/dL — ABNORMAL HIGH (ref 0–149)
VLDL Cholesterol Cal: 34 mg/dL (ref 5–40)

## 2023-10-05 LAB — HEMOGLOBIN A1C
Est. average glucose Bld gHb Est-mCnc: 180 mg/dL
Hgb A1c MFr Bld: 7.9 % — ABNORMAL HIGH (ref 4.8–5.6)

## 2023-10-05 NOTE — Telephone Encounter (Signed)
 FYI Only or Action Required?: Action required by provider: update on patient condition.  Patient was last seen in primary care on 10/04/2023 by Herold Hadassah SQUIBB, MD.  Called Nurse Triage reporting Altered Mental Status.  Symptoms began yesterday.  Interventions attempted: Other: EMS was called - did not go to ED.  Symptoms are: stable.  Triage Disposition: See HCP Within 4 Hours (Or PCP Triage)  Patient/caregiver understands and will follow disposition?: Attempted to call patient on both numbers listed to schedule appointment- no answer- unable to leave call back message. Attempted to call sister back - no answer- unable to leave call back message  Reason for Disposition  [1] Acting confused (e.g., disoriented, slurred speech) AND [2] brief (now gone)  Answer Assessment - Initial Assessment Questions Patient's sister , Skanta(DPR) is calling to report possible medication reaction last night: She is not sure what medication he took- so I will try to reach out to the patient. She will let him know to expect call.   1. LEVEL OF CONSCIOUSNESS: How are they (the patient) acting right now? (e.g., alert-oriented, confused, lethargic, stuporous, comatose)     This morning- alert/oriented 2. ONSET: When did the confusion start?  (e.g., minutes, hours, days)     Yesterday- last night- EMS was called 3. PATTERN: Does this come and go, or has it been constant since it started?  Is it present now?     Last night lasted - several hours 4. ALCOHOL or DRUGS: Have they been drinking alcohol or taking any drugs?      no 5. NARCOTIC MEDICINES: Have they been receiving any narcotic medications? (e.g., morphine , Vicodin)     No pain medication- but is on control medications 6. CAUSE: What do you think is causing the confusion?      Medication reaction- sleeping medication- not sure what he took 7. OTHER SYMPTOMS: Are there any other symptoms? (e.g., difficulty breathing, fever, headache,  weakness)     Pacing, taking to himself, violent behavior, undressing  Protocols used: Confusion - Delirium-A-AH   Copied from CRM [id number]. Topic: Clinical - Red Word Triage >> Oct 05, 2023 11:44 AM Tobias CROME wrote: Red Word that prompted transfer to Nurse Triage: Patient had reaction to medication yesterday. Sister unsure which medication patient possibly reacted to. Patient was assessed by EMS and sister informed to call PCP.

## 2023-10-05 NOTE — Telephone Encounter (Signed)
 Called and spoke to the patient. Patient states that he took his Lunesta  last night and doesn't really know what happened. States he has taken it in the past and did not have any issues. Patient states that he does not think that it was this causing the issue. Patient states that he is feeling good right now. Advised patient to please be checked out at [facility] or the ER if something like this happens again. Patient verbalized understanding.

## 2023-10-06 ENCOUNTER — Encounter: Payer: Self-pay | Admitting: Pediatrics

## 2023-10-06 DIAGNOSIS — E1140 Type 2 diabetes mellitus with diabetic neuropathy, unspecified: Secondary | ICD-10-CM | POA: Insufficient documentation

## 2023-10-06 DIAGNOSIS — M792 Neuralgia and neuritis, unspecified: Secondary | ICD-10-CM | POA: Insufficient documentation

## 2023-10-06 NOTE — Assessment & Plan Note (Signed)
 He has depression and anxiety with a history of suicidal ideation. 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 Currently uses hydroxyzine . Previous psychiatric care was disrupted. Refer to psychiatry and explore local psychiatric services.

## 2023-10-06 NOTE — Assessment & Plan Note (Signed)
 He experiences chronic pain following neck surgery with persistent nerve pain. Previous treatments have been ineffective. Lyrica  and amitriptyline  were discussed for pain and sleep management. Prescribe Lyrica  and amitriptyline . Refer to a pm&r and discuss alternative therapies. May also benefit from shock wave therapy, dry needling, cupping, etc.

## 2023-10-06 NOTE — Assessment & Plan Note (Signed)
 Diabetes management is complicated by irregular eating due to pain and nausea. Glipizide  previously caused hypoglycemia. Order blood tests for blood sugar and discontinue glipizide .

## 2023-10-08 ENCOUNTER — Ambulatory Visit: Payer: Self-pay | Admitting: Pediatrics

## 2023-10-08 ENCOUNTER — Other Ambulatory Visit: Payer: Self-pay | Admitting: Pediatrics

## 2023-10-08 DIAGNOSIS — E119 Type 2 diabetes mellitus without complications: Secondary | ICD-10-CM

## 2023-10-08 MED ORDER — ATORVASTATIN CALCIUM 20 MG PO TABS: 20.0000 mg | ORAL_TABLET | Freq: Every day | ORAL | 3 refills | Status: DC

## 2023-10-10 ENCOUNTER — Other Ambulatory Visit: Payer: Self-pay | Admitting: Physical Medicine & Rehabilitation

## 2023-10-10 DIAGNOSIS — M5412 Radiculopathy, cervical region: Secondary | ICD-10-CM

## 2023-10-15 ENCOUNTER — Telehealth: Payer: Self-pay | Admitting: Pediatrics

## 2023-10-15 LAB — CDIFF NAA+O+P+STOOL CULTURE: Toxigenic C. Difficile by PCR: NEGATIVE

## 2023-10-15 NOTE — Telephone Encounter (Signed)
 Copied from CRM [id number]. Topic: General - Other >> Oct 15, 2023 11:08 AM Selinda RAMAN wrote: Reason for CRM: Wilbern with [facility] called in checking on the status of a blood thinner form she faxed the provider last Wednesday July 16th. The patient is on Plavix  so the provider needs to sign off on this stating it will be ok for the injection. He is wanting to schedule this as soon as possible so she needs this form before she can schedule him. I called and spoke with Cleatis and she said the provider has been out and ask if she would re send the fax. I told Whitely and she will re fax the form back over. Please assist further so she can get the patient scheduled as soon as possible.

## 2023-10-17 NOTE — Telephone Encounter (Signed)
 Paperwork has been completed

## 2023-10-19 ENCOUNTER — Telehealth: Payer: Self-pay | Admitting: Pediatrics

## 2023-10-19 ENCOUNTER — Other Ambulatory Visit: Payer: Self-pay | Admitting: Pediatrics

## 2023-10-19 DIAGNOSIS — F5104 Psychophysiologic insomnia: Secondary | ICD-10-CM

## 2023-10-19 MED ORDER — ESZOPICLONE 2 MG PO TABS
2.0000 mg | ORAL_TABLET | Freq: Every evening | ORAL | 3 refills | Status: DC | PRN
Start: 2023-10-19 — End: 2023-12-19

## 2023-10-19 NOTE — Telephone Encounter (Signed)
   Copied from CRM [id number]. Topic: Clinical - Prescription Issue >> Oct 19, 2023 10:57 AM Vena H wrote: Reason for CRM: Pt states the eszopiclone  (LUNESTA ) 1 MG TABS tablet only works if he takes two at night and he is wanting to know if he can have a refill due to the fact that he is about to run out since he is doubling up on them. Please reach out to pt  Please advise?

## 2023-10-19 NOTE — Telephone Encounter (Signed)
 Copied from CRM [id number]. Topic: Clinical - Prescription Issue >> Oct 19, 2023  2:09 PM Zebedee SAUNDERS wrote: Reason for CRM: Pt stated he is out of his anxiety medication but does not know the name. Pt would like a call back at [phone]

## 2023-10-19 NOTE — Telephone Encounter (Signed)
Informed patient.  Voiced understanding 

## 2023-10-19 NOTE — Progress Notes (Signed)
 Updated lunesta  dose to 2mg   Michael SHAUNNA Nett, MD

## 2023-10-19 NOTE — Telephone Encounter (Signed)
 Called and spoke with patient he is requesting a refill on hydroxyzine  that was previously filled by his psychiatrist that he is no longer seeing he hasn't been able to find a new and is needing refills on his meds please advise?

## 2023-10-19 NOTE — Telephone Encounter (Signed)
 Copied from CRM [id number]. Topic: Clinical - Medication Refill >> Oct 19, 2023  2:05 PM Zebedee SAUNDERS wrote: Medication: eszopiclone  (LUNESTA ) 2 MG TABS tablet  Has the patient contacted their pharmacy? Yes (Agent: If no, request that the patient contact the pharmacy for the refill. If patient does not wish to contact the pharmacy document the reason why and proceed with request.) (Agent: If yes, when and what did the pharmacy advise?)  This is the patient's preferred pharmacy:   [facility] [address] ([address]), [place] - [address] [place]) [place] [address] Phone: [phone] Fax: [phone]  Is this the correct pharmacy for this prescription? Yes If no, delete pharmacy and type the correct one.   Has the prescription been filled recently? Yes  Is the patient out of the medication? Yes  Has the patient been seen for an appointment in the last year OR does the patient have an upcoming appointment? Yes  Can we respond through MyChart? Yes  Agent: Please be advised that Rx refills may take up to 3 business days. We ask that you follow-up with your pharmacy.

## 2023-10-22 NOTE — Telephone Encounter (Signed)
 Requested medications are due for refill today.  no  Requested medications are on the active medications list.  yes  Last refill. 10/19/2023 #90 3 rf  Future visit scheduled.   yes  Notes to clinic.  Pt wants rx to go to a different pharmacy.    Requested Prescriptions  Pending Prescriptions Disp Refills   eszopiclone  (LUNESTA ) 2 MG TABS tablet 90 tablet 3    Sig: Take 1 tablet (2 mg total) by mouth at bedtime as needed for sleep. Take immediately before bedtime     Not Delegated - Psychiatry:  Anxiolytics/Hypnotics Failed - 10/22/2023 11:58 AM      Failed - This refill cannot be delegated      Passed - Urine Drug Screen completed in last 360 days      Passed - Valid encounter within last 6 months    Recent Outpatient Visits           2 weeks ago Chronic pain syndrome   [place] [facility] Herold Hadassah SQUIBB, MD

## 2023-10-23 ENCOUNTER — Other Ambulatory Visit: Payer: Self-pay | Admitting: Pediatrics

## 2023-10-23 DIAGNOSIS — F332 Major depressive disorder, recurrent severe without psychotic features: Secondary | ICD-10-CM

## 2023-10-23 DIAGNOSIS — F3132 Bipolar disorder, current episode depressed, moderate: Secondary | ICD-10-CM

## 2023-10-23 MED ORDER — HYDROXYZINE HCL 50 MG PO TABS
50.0000 mg | ORAL_TABLET | Freq: Three times a day (TID) | ORAL | 2 refills | Status: DC | PRN
Start: 2023-10-23 — End: 2024-01-18

## 2023-10-23 NOTE — Telephone Encounter (Signed)
 Noted

## 2023-11-06 ENCOUNTER — Other Ambulatory Visit

## 2023-11-13 ENCOUNTER — Telehealth: Payer: Self-pay

## 2023-11-13 NOTE — Telephone Encounter (Signed)
 Copied from CRM #[id number]. Topic: Clinical - Medical Advice >> Nov 13, 2023  2:37 PM Amy B wrote: Reason for CRM:  Misplaced medications.  Would like to know what he needs to do to get them replaced. He is currently living in a motel and cannot find his meds.  He requests a call back to discuss.

## 2023-11-20 ENCOUNTER — Ambulatory Visit: Admitting: Pediatrics

## 2023-11-20 NOTE — Telephone Encounter (Signed)
 Attempted to reach pt , no vm setup  Okay for E2C2 to speak with pt if call is returned to clarify which meds are needing refills

## 2023-11-27 ENCOUNTER — Ambulatory Visit: Payer: Self-pay

## 2023-11-27 NOTE — Telephone Encounter (Signed)
 FYI Only or Action Required?: Action required by provider: request for appointment.  Patient was last seen in primary care on 10/04/2023 by Herold Hadassah SQUIBB, MD.  Called Nurse Triage reporting Dizziness.  Symptoms began a week ago.  Interventions attempted: Nothing.  Symptoms are: unchanged.  Triage Disposition: See PCP When Office is Open (Within 3 Days)  Patient/caregiver understands and will follow disposition?: Yes       Copied from CRM #[id number]. Topic: Clinical - Red Word Triage >> Nov 27, 2023 11:11 AM Marissa P wrote: [facility] that prompted transfer to Nurse Triage: Yesterday some pain in knees, when he stands now legs feel like jelly and he falls down. When standing he feels confused Reason for Disposition  [1] Weakness of arm / hand, or leg / foot AND [2] is a chronic symptom (recurrent or ongoing AND present > 4 weeks)    Pt reports legs feel like going to give out on him; comes and goes, been going on for years. Denies at this time  Answer Assessment - Initial Assessment Questions Patient requesting video visit and does not have transportation for appt 11/29/23. Advised ED if symptoms worsen.  Denies HA, pain, blurred vision, dizziness, weakness/numbness, difficulty speech and ambulation at this time.  Patient reports dizziness, feeling like my legs are going to give out on me, shaking when holding things, short term memory and slurred speech; all symptoms come and go, been going on for years but getting worse. Denies any of these symptoms at this time.   Patient reports: I keep having the fear of falling, I feel like I'm going to fall. I'm scared to walk. When I'm in shower I feel like about to fall, from my legs.  Last fall 3 months ago, from tripping over puppy. When walking gets dizzy, feel like I'm about to fall. In my mind, I feel like my legs are going to go out on me.  Hx restless leg syndrome, neck surgery, MI x3, blood thinners, bp meds Requip  med,but haven't  taken in a while   1. SYMPTOM: What is the main symptom you are concerned about? (e.g., weakness, numbness)     Denies weakness, numbness, dizziness When I'm reaching for something my hand start shaking, feels like I'm going to drop this drink 2. ONSET: When did this start? (e.g., minutes, hours, days; while sleeping)     Couple years 3. PATTERN Does this come and go, or has it been constant since it started?  Is it present now?     Comes and goes 4. CARDIAC SYMPTOMS: Have you had any of the following symptoms: chest pain, difficulty breathing, palpitations?     denies 5. NEUROLOGIC SYMPTOMS: Have you had any of the following symptoms: headache, dizziness, vision loss, double vision, changes in speech, unsteady on your feet?     denies 6. OTHER SYMPTOMS: Do you have any other symptoms?     When first waking up looks like black bugs moving around all over ground, then 15 min later it's gone.  I'm been having slurred speech and short term memory coming and going My hand trembling just holding the phone, not sure if nerves in neck causing this.Denies problems with bowel and bladder.  Protocols used: Neurologic Deficit-A-AH

## 2023-11-27 NOTE — Telephone Encounter (Signed)
 FYI Only or Action Required?: FYI only for provider.  Patient was last seen in primary care on 10/04/2023 by Herold Hadassah SQUIBB, MD.  Called Nurse Triage reporting Appointment.  Symptoms began several days ago.  Interventions attempted: Nothing.  Symptoms are: unchanged.  Triage Disposition: See PCP When Office is Open (Within 3 Days)  Patient/caregiver understands and will follow disposition?: Yes   Would like a virtual appointment on Thursday   Copied from CRM #[id number]. Topic: Clinical - Red Word Triage >> Nov 27, 2023  3:38 PM Roselie BROCKS wrote: [facility] that prompted transfer to Nurse Triage: Patient shows in notes he spoke to NT this morning. Patient states he was told to give NT a call back. Reason for Disposition  Nursing judgment or information in reference  Answer Assessment - Initial Assessment Questions 1. REASON FOR CALL: What is your main concern right now?     States doesn't have a car and would like apt to be virtual on Thursday; please call patient back.  Protocols used: No Guideline Available-A-AH

## 2023-11-27 NOTE — Telephone Encounter (Signed)
 Attempted to reach pt unsuccessful no vm setup okay for E2C2 to speak with patient if call is returned

## 2023-11-27 NOTE — Telephone Encounter (Signed)
 Unsuccessful attempting to reach patient

## 2023-11-28 NOTE — Telephone Encounter (Addendum)
 Patient returned call, says doesn't want to use transportation with medicare, doesn't ike to use it and wants to know can he to virtualn

## 2023-11-29 ENCOUNTER — Ambulatory Visit: Admitting: Pediatrics

## 2023-12-17 ENCOUNTER — Telehealth: Payer: Self-pay

## 2023-12-17 NOTE — Telephone Encounter (Unsigned)
 Copied from CRM [id number]. Topic: Clinical - Medication Question >> Dec 17, 2023 11:20 AM Carlyon D wrote: Reason for CRM: Pt is calling in regards to medication eszopiclone  (LUNESTA ) 2 MG TABS tablet. Pt states the 2 mg is not working he is reaching ut to see if he can be put on 3 MG dosage if you can up dosage please send new script to   [facility] [address] ([address]), [place] - [address]) [place] [address] Phone: [phone] Fax: [phone]  please reach out to pt on status update.

## 2023-12-18 ENCOUNTER — Ambulatory Visit: Admitting: Pediatrics

## 2023-12-19 ENCOUNTER — Other Ambulatory Visit: Payer: Self-pay | Admitting: Pediatrics

## 2023-12-19 DIAGNOSIS — F5104 Psychophysiologic insomnia: Secondary | ICD-10-CM

## 2023-12-19 MED ORDER — ESZOPICLONE 3 MG PO TABS: 3.0000 mg | ORAL_TABLET | Freq: Every evening | ORAL | 2 refills | Status: AC | PRN

## 2023-12-19 NOTE — Progress Notes (Signed)
 Changing lunesta  dose to 3mg  at bedtime.  Hadassah SHAUNNA Nett, MD

## 2023-12-21 NOTE — Telephone Encounter (Signed)
 noted

## 2023-12-27 ENCOUNTER — Ambulatory Visit: Admitting: Pediatrics

## 2024-01-03 ENCOUNTER — Ambulatory Visit (INDEPENDENT_AMBULATORY_CARE_PROVIDER_SITE_OTHER): Admitting: Pediatrics

## 2024-01-03 VITALS — BP 130/85 | HR 67 | Temp 97.5°F | Wt 198.8 lb

## 2024-01-03 DIAGNOSIS — F332 Major depressive disorder, recurrent severe without psychotic features: Secondary | ICD-10-CM | POA: Diagnosis not present

## 2024-01-03 DIAGNOSIS — E119 Type 2 diabetes mellitus without complications: Secondary | ICD-10-CM

## 2024-01-03 DIAGNOSIS — E1122 Type 2 diabetes mellitus with diabetic chronic kidney disease: Secondary | ICD-10-CM | POA: Insufficient documentation

## 2024-01-03 DIAGNOSIS — Z23 Encounter for immunization: Secondary | ICD-10-CM | POA: Diagnosis not present

## 2024-01-03 DIAGNOSIS — F5104 Psychophysiologic insomnia: Secondary | ICD-10-CM

## 2024-01-03 DIAGNOSIS — G894 Chronic pain syndrome: Secondary | ICD-10-CM | POA: Diagnosis not present

## 2024-01-03 DIAGNOSIS — R42 Dizziness and giddiness: Secondary | ICD-10-CM

## 2024-01-03 DIAGNOSIS — Z7984 Long term (current) use of oral hypoglycemic drugs: Secondary | ICD-10-CM

## 2024-01-03 LAB — MICROALBUMIN, URINE WAIVED
Creatinine, Urine Waived: 100 mg/dL (ref 10–300)
Microalb, Ur Waived: 80 mg/L — ABNORMAL HIGH (ref 0–19)

## 2024-01-03 MED ORDER — PREGABALIN 25 MG PO CAPS: 25.0000 mg | ORAL_CAPSULE | Freq: Two times a day (BID) | ORAL | 1 refills | Status: DC

## 2024-01-03 NOTE — Patient Instructions (Addendum)
 I am sending lyrica  to help with the pain 25mg  twice daily. Let me know if this helps with the pain.

## 2024-01-03 NOTE — Progress Notes (Signed)
 Office Visit  BP 130/85   Pulse 67   Temp (!) 97.5 F (36.4 C) (Oral)   Wt 198 lb 12.8 oz (90.2 kg)   SpO2 97%   BMI 26.23 kg/m    Subjective:    Patient ID: Michael Schmitt, male    DOB: 1970-04-03, 53 y.o.   MRN: [id number]  HPI: Michael Schmitt is a 53 y.o. male  Chief Complaint  Patient presents with   Dizziness    Ongoing for years     Discussed the use of AI scribe software for clinical note transcription with the patient, who gave verbal consent to proceed.  History of Present Illness   Michael Schmitt is a 53 year old male who presents for follow-up on chronic pain and dizziness.  He has been experiencing chronic pain managed with injections, providing temporary relief for about two weeks. He has undergone these injections three or four times. Due to transportation issues, he has not seen the physical medicine team recently but plans to return now that he has a car. He previously discussed with an orthopedic specialist the possibility of receiving another injection or considering a stimulator if the pain persists.  He experiences dizziness that has been present for several years. He describes his legs feeling like 'jello' and a sensation of potentially falling, requiring him to grab onto something for stability. Looking up at the sky exacerbates his dizziness, causing his head to spin. He has not been formally diagnosed with vertigo but has seen a doctor for these symptoms in the past. He notes that his blood sugar levels have been higher than desired.  He has not been taking Lyrica  (pregabalin ) as he does not recall receiving it, although it was prescribed for pain management. He currently takes Lunesta  at a dose of 3 mg for sleep, which is the highest dose, but still experiences difficulty staying asleep. He also takes amitriptyline , three pills at bedtime, for pain management. He reports taking six to twelve beauty packs a day for pain relief.  He has a history of  seizures triggered by medications such as Zoloft , Wellbutrin, and Cymbalta , and is cautious about medications that could affect his mental health. He uses an app called TextNow for communication as his phone is not activated, but he has access to the internet where he resides.     Relevant past medical, surgical, family and social history reviewed and updated as indicated. Interim medical history since our last visit reviewed. Allergies and medications reviewed and updated.  ROS per HPI unless specifically indicated above     Objective:    BP 130/85   Pulse 67   Temp (!) 97.5 F (36.4 C) (Oral)   Wt 198 lb 12.8 oz (90.2 kg)   SpO2 97%   BMI 26.23 kg/m   Wt Readings from Last 3 Encounters:  01/03/24 198 lb 12.8 oz (90.2 kg)  10/04/23 183 lb 12.8 oz (83.4 kg)  05/10/23 193 lb (87.5 kg)     Physical Exam Constitutional:      Appearance: Normal appearance.  Pulmonary:     Effort: Pulmonary effort is normal.  Musculoskeletal:        General: Normal range of motion.  Skin:    Comments: Normal skin color  Neurological:     General: No focal deficit present.     Mental Status: He is alert. Mental status is at baseline.  Psychiatric:        Mood and Affect: Mood normal.  Behavior: Behavior normal.        Thought Content: Thought content normal.         01/03/2024   10:48 AM 10/04/2023   11:12 AM 11/22/2016   10:53 AM 04/20/2016    3:56 PM  Depression screen PHQ 2/9  Decreased Interest 1 0 0 3  Down, Depressed, Hopeless 3 0 0 3  PHQ - 2 Score 4 0 0 6  Altered sleeping 3 0  3  Tired, decreased energy 0 0  3  Change in appetite 0 0  0  Feeling bad or failure about yourself  0 0  3  Trouble concentrating 0 0  3  Moving slowly or fidgety/restless 0 0  3  Suicidal thoughts 0 0  3   PHQ-9 Score 7 0  24  Difficult doing work/chores Somewhat difficult Somewhat difficult  Extremely dIfficult     Data saved with a previous flowsheet row definition       01/03/2024    10:48 AM 10/04/2023   11:14 AM  GAD 7 : Generalized Anxiety Score  Nervous, Anxious, on Edge 3 1  Control/stop worrying 3 3  Worry too much - different things 3 3  Trouble relaxing 3 3  Restless 3 3  Easily annoyed or irritable 3 1  Afraid - awful might happen 3 2  Total GAD 7 Score 21 16  Anxiety Difficulty Somewhat difficult Somewhat difficult       Assessment & Plan:  Assessment & Plan   Type 2 diabetes mellitus with diabetic chronic kidney disease, unspecified CKD stage, unspecified whether long term insulin  use (HCC) Diabetes mellitus treated with oral medication (HCC) Assessment & Plan: Due for repeat A1c. On metformin .  Orders: -     Hemoglobin A1c -     Comprehensive metabolic panel with GFR -     Microalbumin, Urine Waived  Severe episode of recurrent major depressive disorder, without psychotic features (HCC) Assessment & Plan: Given complex psych hx, referred to psychiatry at last visit but has not yet heard from them. Will inquire about referral with team and provide number ot call for patient.   Chronic pain syndrome Assessment & Plan: Chronic pain with inadequate relief from injections, potential nerve-related pain. Lyrica  may help. - Resend Lyrica  prescription to [facility] pharmacy. - Instruct to take Lyrica  twice daily. - Message neurologist regarding symptoms and imaging. - Consider spinal cord stimulator if further injections are ineffective.  Orders: -     TSH -     Pregabalin ; Take 1 capsule (25 mg total) by mouth 2 (two) times daily.  Dispense: 60 capsule; Refill: 1  Dizziness Chronic dizziness with leg weakness and head spinning. Suspect multifactorial. No new or worsened symptoms though I do consider if lyrica  is affecting dizziness. He is unable to tell me if he is taken med or not but has picked it up. Reviewed return precauations  Chronic insomnia Assessment & Plan: Sleep disturbance with Lunesta  use. Lyrica  may improve sleep by alleviating  pain. - Try Lyrica  for pain and potential sleep improvement. - Monitor response to Lyrica  and adjust dose if necessary.  Immunization due -     Flu vaccine trivalent PF, 6mos and older(Flulaval,Afluria,Fluarix,Fluzone)   Follow up plan: Return in about 3 months (around 04/04/2024).  Hadassah SHAUNNA Nett, MD

## 2024-01-04 LAB — COMPREHENSIVE METABOLIC PANEL WITH GFR
ALT: 17 IU/L (ref 0–44)
AST: 17 IU/L (ref 0–40)
Albumin: 4.5 g/dL (ref 3.8–4.9)
Alkaline Phosphatase: 121 IU/L (ref 47–123)
BUN/Creatinine Ratio: 14 (ref 9–20)
BUN: 11 mg/dL (ref 6–24)
Bilirubin Total: <0.2 mg/dL (ref 0.0–1.2)
CO2: 23 mmol/L (ref 20–29)
Calcium: 9.4 mg/dL (ref 8.7–10.2)
Chloride: 100 mmol/L (ref 96–106)
Creatinine, Ser: 0.81 mg/dL (ref 0.76–1.27)
Globulin, Total: 1.8 g/dL (ref 1.5–4.5)
Glucose: 158 mg/dL — ABNORMAL HIGH (ref 70–99)
Potassium: 4.8 mmol/L (ref 3.5–5.2)
Sodium: 138 mmol/L (ref 134–144)
Total Protein: 6.3 g/dL (ref 6.0–8.5)
eGFR: 107 mL/min/1.73 (ref >59)

## 2024-01-04 LAB — HEMOGLOBIN A1C
Est. average glucose Bld gHb Est-mCnc: 189 mg/dL
Hgb A1c MFr Bld: 8.2 % — ABNORMAL HIGH (ref 4.8–5.6)

## 2024-01-04 LAB — TSH: TSH: 1.4 u[IU]/mL (ref 0.450–4.500)

## 2024-01-08 ENCOUNTER — Encounter: Payer: Self-pay | Admitting: Pediatrics

## 2024-01-08 ENCOUNTER — Telehealth: Payer: Self-pay | Admitting: Pediatrics

## 2024-01-08 NOTE — Assessment & Plan Note (Signed)
 Sleep disturbance with Lunesta  use. Lyrica  may improve sleep by alleviating pain. - Try Lyrica  for pain and potential sleep improvement. - Monitor response to Lyrica  and adjust dose if necessary.

## 2024-01-08 NOTE — Telephone Encounter (Signed)
 Called patient but was not able to leave a message. Patient needs this number so that he can call and make a psychiatry appointment. Ok for E2C2 to give the patient this phone number if he calls back [facility]  [phone]

## 2024-01-08 NOTE — Assessment & Plan Note (Signed)
 Given complex psych hx, referred to psychiatry at last visit but has not yet heard from them. Will inquire about referral with team and provide number ot call for patient.

## 2024-01-08 NOTE — Progress Notes (Signed)
 Called patient but was not able to leave a message. Patient needs this number so that he can call and make a psychiatry appointment. Ok for E2C2 to give the patient this phone number if he calls back [facility]  [phone]

## 2024-01-08 NOTE — Assessment & Plan Note (Signed)
 Due for repeat A1c. On metformin .

## 2024-01-08 NOTE — Assessment & Plan Note (Signed)
 Chronic pain with inadequate relief from injections, potential nerve-related pain. Lyrica  may help. - Resend Lyrica  prescription to [facility] pharmacy. - Instruct to take Lyrica  twice daily. - Message neurologist regarding symptoms and imaging. - Consider spinal cord stimulator if further injections are ineffective.

## 2024-01-10 ENCOUNTER — Other Ambulatory Visit: Payer: Self-pay | Admitting: Pediatrics

## 2024-01-10 DIAGNOSIS — F5104 Psychophysiologic insomnia: Secondary | ICD-10-CM

## 2024-01-10 DIAGNOSIS — G894 Chronic pain syndrome: Secondary | ICD-10-CM

## 2024-01-11 NOTE — Telephone Encounter (Signed)
 Requested Prescriptions  Pending Prescriptions Disp Refills   amitriptyline  (ELAVIL ) 50 MG tablet [Pharmacy Med Name: Amitriptyline  HCl 50 MG Oral Tablet] 90 tablet 0    Sig: TAKE 3 TABLETS BY MOUTH AT BEDTIME     Psychiatry:  Antidepressants - Heterocyclics (TCAs) Passed - 01/11/2024  3:14 PM      Passed - Completed PHQ-2 or PHQ-9 in the last 360 days      Passed - Valid encounter within last 6 months    Recent Outpatient Visits           1 week ago Type 2 diabetes mellitus with diabetic chronic kidney disease, unspecified CKD stage, unspecified whether long term insulin  use (HCC)   [place] [facility] Herold Hadassah SQUIBB, MD   3 months ago Chronic pain syndrome   [place] [facility] Herold Hadassah SQUIBB, MD

## 2024-01-15 ENCOUNTER — Ambulatory Visit: Payer: Self-pay | Admitting: Pediatrics

## 2024-01-16 ENCOUNTER — Other Ambulatory Visit: Payer: Self-pay | Admitting: Pediatrics

## 2024-01-16 DIAGNOSIS — F332 Major depressive disorder, recurrent severe without psychotic features: Secondary | ICD-10-CM

## 2024-01-16 DIAGNOSIS — F3132 Bipolar disorder, current episode depressed, moderate: Secondary | ICD-10-CM

## 2024-01-18 NOTE — Telephone Encounter (Signed)
 Requested Prescriptions  Pending Prescriptions Disp Refills   hydrOXYzine  (ATARAX ) 50 MG tablet [Pharmacy Med Name: HYDROXYZINE  HCL 50 MG TABLET] 270 tablet 0    Sig: TAKE 1 TABLET BY MOUTH 3 TIMES DAILY AS NEEDED FOR ANXIETY.     Ear, Nose, and Throat:  Antihistamines 2 Passed - 01/18/2024 10:42 AM      Passed - Cr in normal range and within 360 days    Creatinine  Date Value Ref Range Status  03/18/2014 1.02 0.60 - 1.30 mg/dL Final   Creatinine, Ser  Date Value Ref Range Status  01/03/2024 0.81 0.76 - 1.27 mg/dL Final   Creatinine, Urine  Date Value Ref Range Status  01/24/2019 53 mg/dL Final    Comment:    Performed at [facility], [address], [place], [place] [address]         Passed - Valid encounter within last 12 months    Recent Outpatient Visits           2 weeks ago Type 2 diabetes mellitus with diabetic chronic kidney disease, unspecified CKD stage, unspecified whether long term insulin  use (HCC)   [place] [facility] Herold Hadassah SQUIBB, MD   3 months ago Chronic pain syndrome   [place] [facility] Herold Hadassah SQUIBB, MD

## 2024-01-30 ENCOUNTER — Ambulatory Visit: Payer: Self-pay

## 2024-01-30 ENCOUNTER — Telehealth: Payer: Self-pay | Admitting: Pediatrics

## 2024-01-30 NOTE — Telephone Encounter (Signed)
 Called patient to get his appointment changed to come on sooner to see Dr Herold.

## 2024-01-30 NOTE — Telephone Encounter (Signed)
 This is not my patient.  He is Educational Psychologist patient.  She has appointments that day. Please reschedule with PCP

## 2024-01-30 NOTE — Telephone Encounter (Signed)
 FYI Only or Action Required?: FYI only for provider: appointment scheduled on 11.13.25.  Patient was last seen in primary care on 01/03/2024 by Herold Hadassah SQUIBB, MD.  Called Nurse Triage reporting Pain.  Symptoms began several weeks ago.  Interventions attempted: OTC medications: bc powder and Prescription medications: lyrica .  Symptoms are: unchanged.  Triage Disposition: See PCP Within 2 Weeks  Patient/caregiver understands and will follow disposition?: Yes   Copied from CRM [id number]. Topic: Clinical - Red Word Triage >> Jan 30, 2024 11:18 AM Michael Schmitt wrote: Red Word that prompted transfer to Nurse Triage:  Patient states he is in a lot of pain, rating his pain at 8 and his BP has been up, last night it was 147/101. Reason for Disposition  Neck pain is a chronic symptom (recurrent or ongoing AND present > 4 weeks)  Answer Assessment - Initial Assessment Questions PT states that he had neck surgery and then fell and broke his shoulder a few years ago. He states that he used to take percocet but that he messed up and was going through a rough time at home and wanted them to keep me so I told them I overdosed on the percocet. But I didn't actually, I just said I did and now they won't let me have any.  Lyrica  is not touching the pain. States he has been taking BC powder and that helps too.    1. ONSET: When did the pain begin?      Years ago 2. LOCATION: Where does it hurt?      Neck down both sides and left shoulder 3. PATTERN Does the pain come and go, or has it been constant since it started?      Worse when the weather gets colder 4. SEVERITY: How bad is the pain?  (Scale 0-10; or none or slight stiffness, mild, moderate, severe)     moderate 5. RADIATION: Does the pain go anywhere else, shoot into your arms?     Into shoulders  6. CORD SYMPTOMS: Any weakness or numbness of the arms or legs?     Little numbness down left side 7. CAUSE: What do you think is  causing the neck pain?     Thinks its from the other surgery 8. NECK OVERUSE: Any recent activities that involved turning or twisting the neck?     no 9. OTHER SYMPTOMS: Do you have any other symptoms? (e.g., headache, fever, chest pain, difficulty breathing, neck swelling)     no  Protocols used: Neck Pain or Stiffness-A-AH

## 2024-01-31 ENCOUNTER — Other Ambulatory Visit: Payer: Self-pay | Admitting: Physical Medicine & Rehabilitation

## 2024-01-31 DIAGNOSIS — M5412 Radiculopathy, cervical region: Secondary | ICD-10-CM

## 2024-02-05 ENCOUNTER — Ambulatory Visit: Admitting: Pediatrics

## 2024-02-05 ENCOUNTER — Ambulatory Visit: Payer: Self-pay

## 2024-02-05 ENCOUNTER — Ambulatory Visit

## 2024-02-05 NOTE — Telephone Encounter (Signed)
 Sister calling stating that she is calling to cancel appt today and reschedule as pt does not have transportation to the appt. RN confirmed that pt is not having s/s at this time, caller denies. Pt AWV was rescheduled.   Copied from CRM [id number]. Topic: Clinical - Red Word Triage >> Feb 05, 2024  8:14 AM Larissa RAMAN wrote: [facility] that prompted transfer to Nurse Triage: neck pain, LT arm numbness   ----------------------------------------------------------------------- From previous Reason for Contact - Scheduling: Patient/patient representative is calling to schedule an appointment. Refer to attachments for appointment information.

## 2024-02-06 ENCOUNTER — Ambulatory Visit: Payer: Self-pay

## 2024-02-06 NOTE — Telephone Encounter (Signed)
 FYI Only or Action Required?: FYI only for provider: appointment scheduled on 11.19.25.  Patient was last seen in primary care on 01/03/2024 by Michael Hadassah SQUIBB, MD.  Called Nurse Triage reporting No chief complaint on file..  Symptoms began several years ago.  Interventions attempted: Prescription medications: lyrica  doesn't work and English as a second language teacher.  Symptoms are: unchanged.  Triage Disposition: See PCP Within 2 Weeks  Patient/caregiver understands and will follow disposition?: Yes     Copied from CRM [id number]. Topic: Clinical - Red Word Triage >> Feb 06, 2024  1:59 PM Michael Schmitt wrote: Red Word that prompted transfer to Nurse Triage: patient called back to reschedule appt for 11/13. He was triaged for pain and left arm numb. I was transferring him and he had me on hold for 5 mins. >> Feb 06, 2024  3:03 PM Michael Schmitt wrote: Pt is calling back to make an appt for neck pain and numbness in left arm   Reason for Disposition  Neck pain is a chronic symptom (recurrent or ongoing AND present > 4 weeks)  Answer Assessment - Initial Assessment Questions PT states pain has been ongoing. He has not been able to make it to his appts either due to rides or the pain. He states pain is in his neck constantly. He states lyrica  is not working. States he has not been able to get pain medicine because he messed up and lied to an ER once and stated he was going to OD on them only to get them to keep him but now no one will give them to him. He states he needs that ex sponged from his record. RN advised he will have to contact a lawyer for that. PT scheduled for next week. He requested a Tuesday or Thursday afternoon appt due to his sister wanting to be at the appt and she does dialysis on MWF.       1. ONSET: When did the pain begin?      Years ago 2. LOCATION: Where does it hurt?      neck 3. PATTERN Does the pain come and go, or has it been constant since it started?       constant 4. SEVERITY: How bad is the pain?  (Scale 0-10; or none or slight stiffness, mild, moderate, severe)     severe 5. RADIATION: Does the pain go anywhere else, shoot into your arms?     Left arm, numbness 6. CORD SYMPTOMS: Any weakness or numbness of the arms or legs?     Left arm 7. CAUSE: What do you think is causing the neck pain?     Neck surgery and a fall 8. NECK OVERUSE: Any recent activities that involved turning or twisting the neck?     no 9. OTHER SYMPTOMS: Do you have any other symptoms? (e.g., headache, fever, chest pain, difficulty breathing, neck swelling)     no  Protocols used: Neck Pain or Stiffness-A-AH

## 2024-02-07 ENCOUNTER — Ambulatory Visit: Admitting: Nurse Practitioner

## 2024-02-12 ENCOUNTER — Encounter: Payer: Self-pay | Admitting: Pediatrics

## 2024-02-12 ENCOUNTER — Other Ambulatory Visit: Payer: Self-pay | Admitting: Pediatrics

## 2024-02-12 ENCOUNTER — Ambulatory Visit: Admitting: Pediatrics

## 2024-02-12 ENCOUNTER — Telehealth: Payer: Self-pay

## 2024-02-12 VITALS — BP 139/91 | HR 83 | Ht 74.0 in | Wt 203.4 lb

## 2024-02-12 DIAGNOSIS — Z658 Other specified problems related to psychosocial circumstances: Secondary | ICD-10-CM

## 2024-02-12 DIAGNOSIS — F5104 Psychophysiologic insomnia: Secondary | ICD-10-CM

## 2024-02-12 DIAGNOSIS — K120 Recurrent oral aphthae: Secondary | ICD-10-CM

## 2024-02-12 DIAGNOSIS — E119 Type 2 diabetes mellitus without complications: Secondary | ICD-10-CM

## 2024-02-12 DIAGNOSIS — I10 Essential (primary) hypertension: Secondary | ICD-10-CM

## 2024-02-12 DIAGNOSIS — G894 Chronic pain syndrome: Secondary | ICD-10-CM

## 2024-02-12 MED ORDER — BLOOD GLUCOSE MONITORING SUPPL DEVI: 1.0000 | 0 refills | Status: AC

## 2024-02-12 MED ORDER — CHLORHEXIDINE GLUCONATE 0.12 % MT SOLN: 15.0000 mL | Freq: Two times a day (BID) | OROMUCOSAL | 0 refills | Status: AC

## 2024-02-12 MED ORDER — LANCET DEVICE MISC: 1.0000 | 0 refills | Status: AC

## 2024-02-12 MED ORDER — BLOOD GLUCOSE TEST VI STRP: 1.0000 | ORAL_STRIP | 0 refills | Status: AC

## 2024-02-12 MED ORDER — LANCETS MISC: 1.0000 | 0 refills | Status: AC

## 2024-02-12 MED ORDER — AMLODIPINE BESYLATE 10 MG PO TABS
10.0000 mg | ORAL_TABLET | Freq: Every day | ORAL | 3 refills | Status: AC
Start: 2024-02-12 — End: ?

## 2024-02-12 MED ORDER — PREGABALIN 100 MG PO CAPS: 100.0000 mg | ORAL_CAPSULE | Freq: Two times a day (BID) | ORAL | 0 refills | Status: AC

## 2024-02-12 NOTE — Telephone Encounter (Signed)
 Copied from CRM [id number]. Topic: Clinical - Prescription Issue >> Feb 12, 2024 11:59 AM Winona SAUNDERS wrote: [facility] calling to inform the practice the pt has been getting  eszopiclone  3 MG TABS filled by [facility] as well as [facility] Here are a few dates 7/28 2mg   [facility] 09/24 3 mg 30 [facility] 10/17 3 mg 30 days [facility]  10/23 2mg  [facility] days [facility] today 11/18 for 3 mg Pharmacists asked where the 2 mg is because that would have to be destroyed before she can fill the 3 mg and the pt told her he doesn't know where they are and that he he lives in a hotel. Pharmacist told him they can not fill it and called us . Denied-[facility]  Scripts all have the same RX Number and prescriberETTER Hadassah Nett) this is all coming from the state data base.   Pt has an appointment to day in Office at 3:20pm

## 2024-02-12 NOTE — Progress Notes (Signed)
 Office Visit  BP (!) 139/91 (BP Location: Left Arm, Patient Position: Sitting, Cuff Size: Large)   Pulse 83   Ht 6' 2 (1.88 m)   Wt 203 lb 6.4 oz (92.3 kg)   SpO2 95%   BMI 26.12 kg/m    Subjective:    Patient ID: Michael Schmitt, male    DOB: September 30, 1970, 53 y.o.   MRN: [id number]  HPI: Michael Schmitt is a 53 y.o. male  Chief Complaint  Patient presents with   Sore Throat    Feels as if he has sores in the mouth, dry mouth   Medication Management    Would like a machine to test blood sugar, stated lyrica  was ineffective    Neck Pain    Has been in pain for years, pain level today is about an 8    Medication Refill    All medications    Discussed the use of AI scribe software for clinical note transcription with the patient, who gave verbal consent to proceed.  History of Present Illness   Michael Schmitt is a 53 year old male with chronic pain and hypertension who presents with persistent pain and high blood pressure.  He experiences persistent pain that is not adequately managed by his current medication regimen. He is taking Lyrica  at a low dose of 25 mg twice a day, which he finds ineffective. He has previously tried meloxicam  and various muscle relaxers without relief. The pain is contributing to elevated blood pressure.  He is currently experiencing a sore throat and mouth sores, which he describes as 'hurting bad' and 'like little kidneys, like sores.' No systemic infection is present. He attributes these symptoms to stress, noting significant personal stressors including his son's legal issues, his mother's placement in a rest home, and his own housing instability.  He is currently taking amlodipine . He has previously been on lisinopril  and carvedilol , but notes that carvedilol  primarily affects heart rate rather than blood pressure.  He reports significant stress related to his living situation, as he is currently residing in a motel with his daughter and is  financially strained. He is concerned about his mother's well-being in a rest home and the impact of these stressors on his health.  He mentions a history of seizures and is currently on Depakote  500 mg, although he feels it is not effective. He has not seen a specialist for this recently.  He is also experiencing sleep disturbances, waking every two hours despite taking Lunesta . He attributes some of his sleep issues to pain and stress, and has previously undergone a sleep study for suspected sleep apnea.      Relevant past medical, surgical, family and social history reviewed and updated as indicated. Interim medical history since our last visit reviewed. Allergies and medications reviewed and updated.  ROS per HPI unless specifically indicated above     Objective:    BP (!) 139/91 (BP Location: Left Arm, Patient Position: Sitting, Cuff Size: Large)   Pulse 83   Ht 6' 2 (1.88 m)   Wt 203 lb 6.4 oz (92.3 kg)   SpO2 95%   BMI 26.12 kg/m   Wt Readings from Last 3 Encounters:  02/12/24 203 lb 6.4 oz (92.3 kg)  01/03/24 198 lb 12.8 oz (90.2 kg)  10/04/23 183 lb 12.8 oz (83.4 kg)     Physical Exam Constitutional:      Appearance: Normal appearance.  Pulmonary:     Effort: Pulmonary effort is normal.  Musculoskeletal:  General: Normal range of motion.  Skin:    Comments: Normal skin color  Neurological:     General: No focal deficit present.     Mental Status: He is alert. Mental status is at baseline.  Psychiatric:        Mood and Affect: Mood normal.        Behavior: Behavior normal.        Thought Content: Thought content normal.         02/12/2024    3:35 PM 01/03/2024   10:48 AM 10/04/2023   11:12 AM 11/22/2016   10:53 AM 04/20/2016    3:56 PM  Depression screen PHQ 2/9  Decreased Interest 1 1 0 0 3  Down, Depressed, Hopeless 0 3 0 0 3  PHQ - 2 Score 1 4 0 0 6  Altered sleeping 3 3 0  3  Tired, decreased energy 1 0 0  3  Change in appetite 0 0 0  0   Feeling bad or failure about yourself  0 0 0  3  Trouble concentrating 0 0 0  3  Moving slowly or fidgety/restless 0 0 0  3  Suicidal thoughts 0 0 0  3   PHQ-9 Score 5 7  0   24   Difficult doing work/chores  Somewhat difficult Somewhat difficult  Extremely dIfficult     Data saved with a previous flowsheet row definition       02/12/2024    3:35 PM 01/03/2024   10:48 AM 10/04/2023   11:14 AM  GAD 7 : Generalized Anxiety Score  Nervous, Anxious, on Edge 0 3 1  Control/stop worrying 3 3 3   Worry too much - different things 3 3 3   Trouble relaxing 3 3 3   Restless 3 3 3   Easily annoyed or irritable 3 3 1   Afraid - awful might happen 1 3 2   Total GAD 7 Score 16 21 16   Anxiety Difficulty  Somewhat difficult Somewhat difficult       Assessment & Plan:  Assessment & Plan   Type 2 diabetes mellitus without complication, without long-term current use of insulin  (HCC) Assessment & Plan: Diabetes management requires monitoring. Previous glucose check satisfactory, but home monitoring equipment lost. - Ordered glucometer and supplies for home glucose monitoring.  Orders: -     Blood Glucose Monitoring Suppl; 1 each by Does not apply route as directed. Dispense based on patient and insurance preference. Use up to four times daily as directed. (FOR ICD-10 E10.9, E11.9).  Dispense: 1 each; Refill: 0 -     Blood Glucose Test; 1 each by Does not apply route as directed. Dispense based on patient and insurance preference. Use up to four times daily as directed. (FOR ICD-10 E10.9, E11.9).  Dispense: 100 strip; Refill: 0 -     Lancet Device; 1 each by Does not apply route as directed. Dispense based on patient and insurance preference. Use up to four times daily as directed. (FOR ICD-10 E10.9, E11.9).  Dispense: 1 each; Refill: 0 -     Lancets; 1 each by Does not apply route as directed. Dispense based on patient and insurance preference. Use up to four times daily as directed. (FOR ICD-10 E10.9,  E11.9).  Dispense: 100 each; Refill: 0  Primary hypertension Assessment & Plan: Hypertension likely worsened by chronic pain. Current regimen includes amlodipine  and carvedilol , with carvedilol  primarily affecting heart rate. - Increased amlodipine  to 10 mg daily.  Orders: -  amLODIPine  Besylate; Take 1 tablet (10 mg total) by mouth daily.  Dispense: 90 tablet; Refill: 3  Chronic pain syndrome Assessment & Plan: Inadequate response to Lyrica . Previous treatments ineffective. Pain affects blood pressure and sleep. - Increased Lyrica  to 50 mg twice daily, plan to increase to 100 mg twice daily if tolerated. - Consider spinal cord stimulator if pain persists. - Referred to social work for housing assistance.  Orders: -     Pregabalin ; Take 1 capsule (100 mg total) by mouth 2 (two) times daily.  Dispense: 60 capsule; Refill: 0  Aphthous ulcer Assessment & Plan: Sore throat and oral sores, possibly stress-related, no infection. Smoking may exacerbate symptoms. - Prescribed chlorhexidine  wash for oral sores and sore throat.  Orders: -     Chlorhexidine  Gluconate; Use as directed 15 mLs in the mouth or throat 2 (two) times daily.  Dispense: 120 mL; Refill: 0  Chronic insomnia Assessment & Plan: Chronic insomnia with frequent awakenings, possibly related to pain and stress. Lunesta  provides limited relief. - Increased Lyrica  to aid sleep. - Referred to sleep lab for further evaluation.  Orders: -     Ambulatory referral to Sleep Studies  Psychosocial stressors Significant stressors including housing instability, family issues, and financial strain affecting health and pain management. - Referred to social work for housing and financial resources assistance.  -     AMB Referral [facility] Care Management   Follow up plan: Return in about 4 weeks (around 03/11/2024) for Chronic illness f/u.  Michael SHAUNNA Nett, MD

## 2024-02-14 ENCOUNTER — Telehealth: Payer: Self-pay

## 2024-02-14 ENCOUNTER — Other Ambulatory Visit: Payer: Self-pay | Admitting: Pediatrics

## 2024-02-14 DIAGNOSIS — R110 Nausea: Secondary | ICD-10-CM

## 2024-02-14 DIAGNOSIS — F5104 Psychophysiologic insomnia: Secondary | ICD-10-CM

## 2024-02-14 NOTE — Telephone Encounter (Signed)
 Sucralfate  not on med list to pend

## 2024-02-14 NOTE — Telephone Encounter (Unsigned)
 Copied from CRM #[id number]. Topic: Clinical - Medication Refill >> Feb 14, 2024 12:53 PM Zebedee SAUNDERS wrote: Medication: eszopiclone  3 MG TABS, Zofran , Sucralfate   Has the patient contacted their pharmacy? Yes (Agent: If no, request that the patient contact the pharmacy for the refill. If patient does not wish to contact the pharmacy document the reason why and proceed with request.) (Agent: If yes, when and what did the pharmacy advise?)Pharmacy need script  This is the patient's preferred pharmacy:  [facility] [address] ([address]), [place] - [address] [place]) [place] [address] Phone: [phone] Fax: [phone]  Is this the correct pharmacy for this prescription? Yes If no, delete pharmacy and type the correct one.   Has the prescription been filled recently? Yes  Is the patient out of the medication? Yes  Has the patient been seen for an appointment in the last year OR does the patient have an upcoming appointment? Yes  Can we respond through MyChart? Yes  Agent: Please be advised that Rx refills may take up to 3 business days. We ask that you follow-up with your pharmacy.

## 2024-02-14 NOTE — Telephone Encounter (Signed)
 Requested Prescriptions  Pending Prescriptions Disp Refills   amitriptyline  (ELAVIL ) 50 MG tablet [Pharmacy Med Name: Amitriptyline  HCl 50 MG Oral Tablet] 90 tablet 2    Sig: TAKE 3 TABLETS BY MOUTH AT BEDTIME     Psychiatry:  Antidepressants - Heterocyclics (TCAs) Passed - 02/14/2024  4:34 PM      Passed - Completed PHQ-2 or PHQ-9 in the last 360 days      Passed - Valid encounter within last 6 months    Recent Outpatient Visits           2 days ago Type 2 diabetes mellitus without complication, without long-term current use of insulin  [facility])   [facility] Herold Hadassah SQUIBB, MD   1 month ago Type 2 diabetes mellitus with diabetic chronic kidney disease, unspecified CKD stage, unspecified whether long term insulin  use (HCC)   [facility] Herold Hadassah SQUIBB, MD   4 months ago Chronic pain syndrome   [facility] Herold Hadassah SQUIBB, MD

## 2024-02-14 NOTE — Progress Notes (Signed)
 Complex Care Management Note  Care Guide Note 02/14/2024 Name: Michael Schmitt MRN: [id number] DOB: January 16, 1971  Michael Schmitt is a 53 y.o. year old male who sees Herold, Hadassah SQUIBB, MD for primary care. I reached out to [employer] by phone today to offer complex care management services.  Michael Schmitt was given information about Complex Care Management services today including:   The Complex Care Management services include support from the care team which includes your Nurse Care Manager, Clinical Social Worker, or Pharmacist.  The Complex Care Management team is here to help remove barriers to the health concerns and goals most important to you. Complex Care Management services are voluntary, and the patient may decline or stop services at any time by request to their care team member.   Complex Care Management Consent Status: Patient agreed to services and verbal consent obtained.   Follow up plan:  Telephone appointment with complex care management team member scheduled for:  BSW 02/19/2024 RNCM 02/26/2024  Encounter Outcome:  Patient Scheduled  Jeoffrey Buffalo , RMA     [place]  [facility] Guide  Direct Dial: [phone]  Website: [email or web address]

## 2024-02-16 NOTE — Telephone Encounter (Signed)
 Requested medication (s) are due for refill today: yes  Requested medication (s) are on the active medication list: yes  Last refill:  10/04/23  Future visit scheduled: yes  Notes to clinic:  Unable to refill per protocol, cannot delegate.      Requested Prescriptions  Pending Prescriptions Disp Refills   ondansetron  (ZOFRAN ) 4 MG tablet [Pharmacy Med Name: Ondansetron  HCl 4 MG Oral Tablet] 60 tablet 0    Sig: TAKE 1 TABLET BY MOUTH EVERY 8 HOURS AS NEEDED FOR NAUSEA OR VOMITING     Not Delegated - Gastroenterology: Antiemetics - ondansetron  Failed - 02/16/2024  8:40 AM      Failed - This refill cannot be delegated      Passed - AST in normal range and within 360 days    AST  Date Value Ref Range Status  01/03/2024 17 0 - 40 IU/L Final   SGOT(AST)  Date Value Ref Range Status  03/18/2014 44 (H) 15 - 37 Unit/L Final         Passed - ALT in normal range and within 360 days    ALT  Date Value Ref Range Status  01/03/2024 17 0 - 44 IU/L Final   SGPT (ALT)  Date Value Ref Range Status  03/18/2014 75 (H) U/L Final    Comment:    14-63 NOTE: New Reference Range 10/14/13          Passed - Valid encounter within last 6 months    Recent Outpatient Visits           4 days ago Type 2 diabetes mellitus without complication, without long-term current use of insulin  (HCC)   [place] [facility] Herold Hadassah SQUIBB, MD   1 month ago Type 2 diabetes mellitus with diabetic chronic kidney disease, unspecified CKD stage, unspecified whether long term insulin  use [facility])   [facility] Herold Hadassah SQUIBB, MD   4 months ago Chronic pain syndrome   [place] [facility] Herold Hadassah SQUIBB, MD

## 2024-02-16 NOTE — Telephone Encounter (Signed)
 Requested medication (s) are due for refill today: yes  Requested medication (s) are on the active medication list: yes  Last refill:  12/19/23  Future visit scheduled: yes  Notes to clinic:  Unable to refill per protocol, cannot delegate.      Requested Prescriptions  Pending Prescriptions Disp Refills   Eszopiclone  3 MG TABS 30 tablet 2    Sig: Take 1 tablet (3 mg total) by mouth at bedtime as needed. Take immediately before bedtime     Not Delegated - Psychiatry:  Anxiolytics/Hypnotics Failed - 02/16/2024  8:58 AM      Failed - This refill cannot be delegated      Failed - Urine Drug Screen completed in last 360 days      Passed - Valid encounter within last 6 months    Recent Outpatient Visits           4 days ago Type 2 diabetes mellitus without complication, without long-term current use of insulin  [facility])   [facility] Herold Hadassah SQUIBB, MD   1 month ago Type 2 diabetes mellitus with diabetic chronic kidney disease, unspecified CKD stage, unspecified whether long term insulin  use (HCC)   [place] [facility] Herold Hadassah SQUIBB, MD   4 months ago Chronic pain syndrome   [facility] Herold Hadassah SQUIBB, MD               ondansetron  (ZOFRAN ) 4 MG tablet 60 tablet 0    Sig: Take 1 tablet (4 mg total) by mouth every 8 (eight) hours as needed for nausea or vomiting.     Not Delegated - Gastroenterology: Antiemetics - ondansetron  Failed - 02/16/2024  8:58 AM      Failed - This refill cannot be delegated      Passed - AST in normal range and within 360 days    AST  Date Value Ref Range Status  01/03/2024 17 0 - 40 IU/L Final   SGOT(AST)  Date Value Ref Range Status  03/18/2014 44 (H) 15 - 37 Unit/L Final         Passed - ALT in normal range and within 360 days    ALT  Date Value Ref Range Status  01/03/2024 17 0 - 44 IU/L Final   SGPT (ALT)  Date Value Ref Range Status  03/18/2014 75 (H) U/L Final     Comment:    14-63 NOTE: New Reference Range 10/14/13          Passed - Valid encounter within last 6 months    Recent Outpatient Visits           4 days ago Type 2 diabetes mellitus without complication, without long-term current use of insulin  [facility])   [place] [facility] Herold Hadassah SQUIBB, MD   1 month ago Type 2 diabetes mellitus with diabetic chronic kidney disease, unspecified CKD stage, unspecified whether long term insulin  use [facility])   [place] [facility] Herold Hadassah SQUIBB, MD   4 months ago Chronic pain syndrome   [facility] Herold Hadassah SQUIBB, MD

## 2024-02-19 ENCOUNTER — Telehealth: Payer: Self-pay | Admitting: Pediatrics

## 2024-02-19 ENCOUNTER — Telehealth: Payer: Self-pay

## 2024-02-19 NOTE — Telephone Encounter (Signed)
 Copied from CRM #[id number]. Topic: Clinical - Prescription Issue >> Feb 19, 2024  4:19 PM Tobias CROME wrote: Reason for CRM: [facility] with [facility] states patient just picked up the 3mg  of the lunesta  on 01/11/24 for a 30 day supply and [facility]17[facility] day supply. Patient reported he threw away, the 2mg  supply.   [facility] states they will not be able to fill the 3mg  due to the record of him picking up the 2mg . They are wanting to confirm which dosage he should be on.   Best callback number: [phone]

## 2024-02-19 NOTE — Patient Instructions (Signed)
 [name] - I am sorry I was unable to reach you today for our scheduled appointment. I work with Herold, Hadassah SQUIBB, MD and am calling to support your healthcare needs. Please contact me at [phone] at your earliest convenience. I look forward to speaking with you soon.   Thank you,  Thersia Hoar, BSW, MHA [place]  [facility] Social Worker, Population Health [phone]

## 2024-02-20 ENCOUNTER — Telehealth: Payer: Self-pay

## 2024-02-20 NOTE — Telephone Encounter (Signed)
 Routing to provider to advise. Which dose of Lunesta  is the patient supposed to be on? The 2 mg or the 3 mg?

## 2024-02-20 NOTE — Telephone Encounter (Signed)
 Copied from CRM #[id number]. Topic: General - Other >> Feb 20, 2024  2:33 PM Amy B wrote: Reason for CRM: Patient requests a document stating that his dog is a administrator, civil service.  Is there a form that needs to be filled out?  He requests a call back to discuss.  [phone]

## 2024-02-20 NOTE — Telephone Encounter (Signed)
 Called pharmacy and clarified the dose as requested.

## 2024-02-25 ENCOUNTER — Encounter: Payer: Self-pay | Admitting: Pediatrics

## 2024-02-25 ENCOUNTER — Telehealth: Payer: Self-pay | Admitting: Pediatrics

## 2024-02-25 ENCOUNTER — Ambulatory Visit: Payer: Self-pay

## 2024-02-25 DIAGNOSIS — K120 Recurrent oral aphthae: Secondary | ICD-10-CM | POA: Insufficient documentation

## 2024-02-25 NOTE — Telephone Encounter (Unsigned)
 Copied from CRM #[id number]. Topic: Clinical - Prescription Issue >> Feb 25, 2024 12:49 PM Delon DASEN wrote: Reason for CRM: Eszopiclone  3 MG TABS- pharmacy refused to fill until they speak with doctor

## 2024-02-25 NOTE — Telephone Encounter (Signed)
 Forwarding to PCP for review.  Lunesta  was increased from 2 mg to 3 mg and then discontinued due to sleep disturbance. Then switched to Lyrica  for pain management and potential to assist with insomnia.

## 2024-02-25 NOTE — Telephone Encounter (Signed)
 Is this something you can do for the patient?

## 2024-02-25 NOTE — Telephone Encounter (Unsigned)
 Copied from CRM #[id number]. Topic: General - Other >> Feb 25, 2024 12:50 PM Delon T wrote: Reason for CRM: Patient needs a letter stating his dog is a metallurgist for the motel he lives in, they are charging him $100- [phone]

## 2024-02-25 NOTE — Assessment & Plan Note (Signed)
 Chronic insomnia with frequent awakenings, possibly related to pain and stress. Lunesta  provides limited relief. - Increased Lyrica  to aid sleep. - Referred to sleep lab for further evaluation.

## 2024-02-25 NOTE — Telephone Encounter (Signed)
 LVM with live person at phone number listed.  This person states that patient is not available and that there is not another phone number to reach him  Copied from CRM #[id number]. Topic: Clinical - Red Word Triage >> Feb 25, 2024 11:45 AM Ivette P wrote: Red Word that prompted transfer to Nurse Triage: Pt has high blood pressure and causing pain. Says medication is not working.    Pt also wanting Lunesta  medication for sleep. Says pharmacy wont fill. Hast slept in over a week.    Pt call dropped while waiting for NT. Could not get pt back on line, called number on file and was told is not with pt.   This is the number called from  Number [phone] - could not get pt on line.

## 2024-02-25 NOTE — Assessment & Plan Note (Signed)
 Diabetes management requires monitoring. Previous glucose check satisfactory, but home monitoring equipment lost. - Ordered glucometer and supplies for home glucose monitoring.

## 2024-02-25 NOTE — Assessment & Plan Note (Signed)
 Inadequate response to Lyrica . Previous treatments ineffective. Pain affects blood pressure and sleep. - Increased Lyrica  to 50 mg twice daily, plan to increase to 100 mg twice daily if tolerated. - Consider spinal cord stimulator if pain persists. - Referred to social work for housing assistance.

## 2024-02-25 NOTE — Assessment & Plan Note (Signed)
 Hypertension likely worsened by chronic pain. Current regimen includes amlodipine  and carvedilol , with carvedilol  primarily affecting heart rate. - Increased amlodipine  to 10 mg daily.

## 2024-02-25 NOTE — Assessment & Plan Note (Signed)
 Sore throat and oral sores, possibly stress-related, no infection. Smoking may exacerbate symptoms. - Prescribed chlorhexidine  wash for oral sores and sore throat.

## 2024-02-26 ENCOUNTER — Telehealth: Payer: Self-pay | Admitting: Pediatrics

## 2024-02-26 ENCOUNTER — Other Ambulatory Visit: Payer: Self-pay

## 2024-02-26 NOTE — Patient Instructions (Signed)
 Visit Information  Thank you for taking time to visit with me today. Please don't hesitate to contact me if I can be of assistance to you before our next scheduled appointment.  Our next appointment is by telephone on 03/11/24 at 1:00pm Please call the care guide team at [phone] if you need to cancel or reschedule your appointment.   Following is a copy of your care plan:   Goals Addressed             This Visit's Progress    VBCI RN Care Plan       Problems:  Chronic Disease Management support and education needs related to DM, HTN Knowledge Deficits related to disease management  Non-adherence to prescribed medication regimen  Goal: Over the next 90 days the Patient will continue to work with RN Care Manager and/or Social Worker to address care management and care coordination needs related to HTN, DM as evidenced by adherence to care management team scheduled appointments     demonstrate Improved health management independence as evidenced by limiting carbohydrates and simple sugars. 1 soda a day or switch to diet soda        take all medications exactly as prescribed and will call provider for medication related questions as evidenced by adhearance to all medications     Interventions:   Diabetes Interventions: Assessed patient's understanding of A1c goal: <6.5% Provided education to patient about basic DM disease process Reviewed medications with patient and discussed importance of medication adherence Counseled on importance of regular laboratory monitoring as prescribed Discussed plans with patient for ongoing care management follow up and provided patient with direct contact information for care management team Provided patient with written educational materials related to hypo and hyperglycemia and importance of correct treatment Advised patient, providing education and rationale, to check cbg 3 x's a day  and record, calling PCP for findings outside established  parameters Screening for signs and symptoms of depression related to chronic disease state  Lab Results  Component Value Date   HGBA1C 8.2 (H) 01/03/2024    Hypertension Interventions: Last practice recorded BP readings:  BP Readings from Last 3 Encounters:  02/12/24 (!) 139/91  01/03/24 130/85  10/04/23 118/70   Most recent eGFR/CrCl:  Lab Results  Component Value Date   EGFR 107 01/03/2024    No components found for: CRCL  Evaluation of current treatment plan related to hypertension self management and patient's adherence to plan as established by provider Reviewed medications with patient and discussed importance of compliance Advised patient to discuss increased blood pressure and symptoms with provider  Patient Self-Care Activities:  check blood sugar at prescribed times: before meals and at bedtime eat fish at least once per week limit fast food meals to no more than 1 per week switch to sugar-free drinks wear comfortable, well-fitting shoes  Plan:  The patient has been provided with contact information for the care management team and has been advised to call with any health related questions or concerns.              Please call the Suicide and Crisis Lifeline: 988 call the [place]  National Suicide Prevention Lifeline: (707)256-4340 or TTY: 430-327-3198 TTY 408-796-5233) to talk to a trained counselor call 1-800-273-TALK (toll free, 24 hour hotline) if you are experiencing a Mental Health or Behavioral Health Crisis or need someone to talk to.  Patient verbalized understanding of Care plan and visit instructions communicated this visit  Audrielle Vankuren RN RN Care Manager  [facility] [phone]

## 2024-02-26 NOTE — Patient Outreach (Signed)
 Complex Care Management   Visit Note  02/26/2024  Name:  Michael Schmitt MRN: [id number] DOB: 09-21-1970  Situation: Referral received for Complex Care Management related to Diabetes with Complications and HTN I obtained verbal consent from Patient.  Visit completed with Patient  on the phone  Background:   Past Medical History:  Diagnosis Date   Alcohol dependence, in remission (HCC) 06/11/2016   Formatting of this note might be different from the original. 2 DUIs in 2017     Anxiety    Anxiety    Arthritis    cerv. stenosis, spondylosis, HNP- lower back , has been followed in pain clinic, has  had injection s in cerv. area   Benzodiazepine withdrawal (HCC) 05/11/2022   withdrawing from Klonopin  (stopping on Friday 05/05/2022 after taking it regularly for some time), Continue CIWA.      CIWA from 05/07/22 1930 to 05/12/22 1930        Date and Time CIWA-Ar Total User    05/11/22 0200 0 TJ    05/11/22 0402 0 TJ    05/11/22 0800 8 CKA    05/11/22 1200 3 CKA    05/11/22 1600 1 CKA    05/11/22 2020 8 JJ    05/12/22 0114 0 JJ    05/12/22 0424 6 JJ    05/12/22 0833 9 MNV    Bipolar 1 disorder (HCC)    Blood dyscrasia    told that when he was young he was a free bleeder   CAD (coronary artery disease)    Cervical spondylosis without myelopathy 07/24/2014   Cervicogenic headache 07/24/2014   Chronic kidney disease    renal calculi- passed spontaneously   Chronic Suicidal ideation 09/15/2016   Depression    Diabetes mellitus without complication (HCC)    Drug-seeking behavior 06/11/2016   Follow Psychiatry recommendations.     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   Long term prescription benzodiazepine use 11/21/2016   Mental disorder     MI, old    Myocardial infarct (HCC) 07/05/2016   03/2016 s/p 4 stents     RLS (restless legs syndrome)    detected on sleep study   Shortness of breath    Syncope 09/29/2019   Ventricular fibrillation (HCC) 04/05/2016    Assessment: Patient Reported Symptoms:  Cognitive Cognitive Status: Confused or disoriented   Health Maintenance Behaviors: Annual physical exam Healing Pattern: Slow Health Facilitated by: Pain control  Neurological Neurological Review of Symptoms: Dizziness, Headaches Neurological Management Strategies: Routine screening Neurological Self-Management Outcome: 4 (good)  HEENT HEENT Symptoms Reported: No symptoms reported HEENT Management Strategies: Coping strategies, Routine screening HEENT Self-Management Outcome: 4 (good)    Cardiovascular Cardiovascular Symptoms Reported: No symptoms reported Does patient have uncontrolled Hypertension?: Yes Is patient checking Blood Pressure at home?: Yes Patient's Recent BP reading at home: 161/100 02/25/24 Cardiovascular Management Strategies: Adequate rest, Medication therapy, Routine screening Weight: 203 lb (92.1 kg) Cardiovascular Self-Management Outcome: 3 (uncertain)  Respiratory Respiratory Symptoms Reported: No symptoms reported Respiratory Management Strategies: Routine screening Respiratory Self-Management Outcome: 4 (good)  Endocrine Endocrine Symptoms Reported: No symptoms reported Is patient diabetic?: Yes Is patient checking blood sugars at home?: Yes Endocrine Self-Management Outcome: 3 (uncertain)  Gastrointestinal Gastrointestinal Symptoms Reported: No symptoms reported Gastrointestinal Self-Management Outcome: 3 (uncertain)    Genitourinary Genitourinary Symptoms Reported: Difficulty initiating stream Genitourinary Self-Management Outcome: 4 (good)  Integumentary Integumentary Symptoms Reported: No symptoms reported, Sweating Skin Management Strategies: Routine screening Skin Self-Management  Outcome: 3 (uncertain) Skin Comment: increased sweating  Musculoskeletal Musculoskelatal Symptoms Reviewed: No symptoms reported Musculoskeletal Management Strategies: Routine screening Musculoskeletal Self-Management Outcome: 4 (good) Falls in the past year?: Yes Number of falls in past year: 1 or less Was there an injury with Fall?: No Fall Risk Category Calculator: 1 Patient Fall Risk Level: Low Fall Risk Patient at Risk for Falls Due to: No Fall Risks Fall risk Follow up: Falls evaluation completed, Education provided, Falls prevention discussed  Psychosocial Psychosocial Symptoms Reported: No symptoms reported Behavioral Management Strategies: Abstinence from substances Behavioral Health Self-Management Outcome: 3 (uncertain) Major Change/Loss/Stressor/Fears (CP): Denies Techniques to Cope with Loss/Stress/Change: Not applicable Quality of Family Relationships: involved, supportive Do you feel physically threatened by others?: No    02/26/2024    PHQ2-9 Depression Screening   Little interest or pleasure in doing things Not at all  Feeling down, depressed, or hopeless Not at all  PHQ-2 - Total Score 0  Trouble falling or staying asleep, or sleeping too much Not at all  Feeling tired or having little energy Not at all  Poor appetite or overeating  Not at all  Feeling bad about yourself - or that you are a failure or have let yourself or your family down Not at all  Trouble concentrating on things, such as reading the newspaper or watching television Not at all  Moving or speaking so slowly that other people could have noticed.  Or the opposite - being so fidgety or restless that you have been moving around a lot more than usual Not at all  Thoughts that you would be better off dead, or hurting yourself in some way Not at all  PHQ2-9 Total Score 0  If you checked off any problems, how difficult have these problems made it for you to do your work, take care of things at home, or get  along with other people    Depression Interventions/Treatment      Today's Vitals   02/26/24 1315  Weight: 203 lb (92.1 kg)   Pain Scale: 0-10 Pain Score: 7  Pain Type: Chronic pain Pain Location: Neck Pain Orientation: Posterior Pain Descriptors / Indicators: Aching, Constant  Medications Reviewed Today     Reviewed by Nivia, Nicholis Stepanek , RN (Registered Nurse) on 02/26/24 at 1309  Med List Status: <None>   Medication Order Taking? Sig Documenting Provider Last Dose Status Informant  albuterol  (VENTOLIN  HFA) 108 (90 Base) MCG/ACT inhaler [id number]  Inhale 2 puffs into the lungs every 6 (six) hours as needed for wheezing or shortness of breath.  Patient not taking: Reported on 02/26/2024   [provider]  Active Pharmacy Records  amitriptyline  (ELAVIL ) 50 MG tablet [id number] Yes TAKE 3 TABLETS BY MOUTH AT BEDTIME Herold Hadassah SQUIBB, MD  Active   amLODipine  (NORVASC ) 10 MG tablet [id number] Yes Take 1 tablet (10 mg total) by mouth daily. Herold Hadassah SQUIBB, MD  Active   atorvastatin  (LIPITOR ) 20 MG tablet [id number] Yes Take 1 tablet (20 mg total) by mouth daily. Herold Hadassah SQUIBB, MD  Active   Blood Glucose Monitoring Suppl DEVI [id number] Yes 1 each by Does not apply route as directed. Dispense based on patient and insurance preference. Use up to four times daily as directed. (  FOR ICD-10 E10.9, E11.9). Herold Hadassah SQUIBB, MD  Active   carvedilol  (COREG ) 6.25 MG tablet [id number] Yes Take 1 tablet (6.25 mg total) by mouth 2 (two) times daily with a meal. Victoria Ruts, MD  Active   chlorhexidine  (PERIDEX ) 0.12 % solution [id number]  Use as directed 15 mLs in the mouth or throat 2 (two) times daily.  Patient not taking: Reported on 02/26/2024   Herold Hadassah SQUIBB, MD  Active   clopidogrel  (PLAVIX ) 75 MG tablet [id number]  Take 1 tablet (75 mg total) by mouth daily.  Patient not taking: Reported on 02/26/2024   Victoria Ruts, MD  Active   divalproex  (DEPAKOTE  ER) 500 MG 24 hr tablet [id number]   Take 500 mg by mouth 3 (three) times daily.  Patient not taking: Reported on 02/26/2024   [provider]  Active   eszopiclone  3 MG TABS [id number] Yes Take 1 tablet (3 mg total) by mouth at bedtime as needed for sleep. Take immediately before bedtime Herold Hadassah SQUIBB, MD  Active   Glucose Blood (BLOOD GLUCOSE TEST STRIPS) STRP [id number] Yes 1 each by Does not apply route as directed. Dispense based on patient and insurance preference. Use up to four times daily as directed. (FOR ICD-10 E10.9, E11.9). Herold Hadassah SQUIBB, MD  Active   hydrOXYzine  (ATARAX ) 50 MG tablet [id number]  TAKE 1 TABLET BY MOUTH 3 TIMES DAILY AS NEEDED FOR ANXIETY.  Patient not taking: Reported on 02/26/2024   Herold Hadassah SQUIBB, MD  Active   Lancet Device MISC [id number] Yes 1 each by Does not apply route as directed. Dispense based on patient and insurance preference. Use up to four times daily as directed. (FOR ICD-10 E10.9, E11.9). Herold Hadassah SQUIBB, MD  Active   Lancets MISC [id number] Yes 1 each by Does not apply route as directed. Dispense based on patient and insurance preference. Use up to four times daily as directed. (FOR ICD-10 E10.9, E11.9). Herold Hadassah SQUIBB, MD  Active   metFORMIN  (GLUCOPHAGE ) 1000 MG tablet [id number] Yes Take 1 tablet (1,000 mg total) by mouth 2 (two) times daily with a meal. Victoria Ruts, MD  Active   ondansetron  (ZOFRAN ) 4 MG tablet [id number] Yes TAKE 1 TABLET BY MOUTH EVERY 8 HOURS AS NEEDED FOR NAUSEA OR VOMITING Herold Hadassah SQUIBB, MD  Active   pregabalin  (LYRICA ) 100 MG capsule [id number] Yes Take 1 capsule (100 mg total) by mouth 2 (two) times daily. Herold Hadassah SQUIBB, MD  Active             Recommendation:   Continue Current Plan of Care  Follow Up Plan:   Telephone follow-up 2 weeks  Hipolito Martinezlopez RN RN Care Manager [facility] [phone]

## 2024-02-26 NOTE — Telephone Encounter (Signed)
 Called patient to let him know that the letter is ready to be picked up.

## 2024-02-26 NOTE — Progress Notes (Signed)
 Emotional support letter written for patient to pick up.  Michael SHAUNNA Nett, MD

## 2024-02-27 ENCOUNTER — Other Ambulatory Visit: Payer: Self-pay | Admitting: Pediatrics

## 2024-02-27 DIAGNOSIS — F5104 Psychophysiologic insomnia: Secondary | ICD-10-CM

## 2024-02-27 NOTE — Telephone Encounter (Signed)
 Called and notified patient's sister that the letter was printed and ready to be picked up. DPR verified to speak to patient's sister.

## 2024-02-27 NOTE — Telephone Encounter (Signed)
 Letter placed in bin for patient pick up. Will call and let patient know.

## 2024-02-27 NOTE — Telephone Encounter (Signed)
 Called [facility] and discussed prescription. Advised pharmacy that patient is no longer taking the 2 mg Lunesta  and is currently taking 3 mg. Pharmacy is now getting the 3 mg ready for the patient.   Called and notified patients sister of the above. DPR verified to speak to patient's sister.

## 2024-02-28 ENCOUNTER — Ambulatory Visit: Admitting: Nurse Practitioner

## 2024-02-28 ENCOUNTER — Inpatient Hospital Stay: Admission: RE | Admit: 2024-02-28 | Source: Ambulatory Visit

## 2024-02-28 ENCOUNTER — Ambulatory Visit: Payer: Self-pay

## 2024-02-28 NOTE — Telephone Encounter (Signed)
 FYI Only or Action Required?: FYI only for provider: refused appointment.  Patient was last seen in primary care on 02/12/2024 by Michael Hadassah SQUIBB, MD.  Called Nurse Triage reporting Cough.  Symptoms began yesterday.  Interventions attempted: OTC medications: Tylenol .  Symptoms are: unchanged.  Triage Disposition: See HCP Within 4 Hours (Or PCP Triage)  Patient/caregiver understands and will follow disposition?: No   Copied from CRM #[id number]. Topic: Clinical - Red Word Triage >> Feb 28, 2024  9:17 AM [name] wrote: Pt has a fever of 101.5 , he is hot and cold, one minute he's burning up the next he's having cold chills. When he coughs it feels like his head is bursting open. He say he's not going to his appt today bc he doesn't want to get anyone sick. Reason for Disposition  [1] Fever > 100 F (37.8 C) AND [2] diabetes mellitus or weak immune system (e.g., HIV positive, cancer chemo, splenectomy, organ transplant, chronic steroids)  Answer Assessment - Initial Assessment Questions Additional info: Called to cancel scheduled appointment today due to illness. See below for details. PAS let this writer know she has already sent a message to clinic informing them patient will not be in for scheduled visit today.   1. ONSET: When did the cough begin?      Yesterday  2. SEVERITY: How bad is the cough today?      Makes head feel like it will explode  3. SPUTUM: Describe the color of your sputum (e.g., none, dry cough; clear, white, yellow, green)     dry 4. HEMOPTYSIS: Are you coughing up any blood? If Yes, ask: How much? (e.g., flecks, streaks, tablespoons, etc.)     denies 5. DIFFICULTY BREATHING: Are you having difficulty breathing? If Yes, ask: How bad is it? (e.g., mild, moderate, severe)      denies 6. FEVER: Do you have a fever? If Yes, ask: What is your temperature, how was it measured, and when did it start?     Denies  7. CARDIAC HISTORY: Do you have any  history of heart disease? (e.g., heart attack, congestive heart failure)       8. LUNG HISTORY: Do you have any history of lung disease?  (e.g., pulmonary embolus, asthma, emphysema)      9. PE RISK FACTORS: Do you have a history of blood clots? (or: recent major surgery, recent prolonged travel, bedridden)      10. OTHER SYMPTOMS: Do you have any other symptoms? (e.g., runny nose, wheezing, chest pain)       Fever 101.5 11. PREGNANCY: Is there any chance you are pregnant? When was your last menstrual period?        12. TRAVEL: Have you traveled out of the country in the last month? (e.g., travel history, exposures)  Protocols used: Cough - Acute Non-Productive-A-AH

## 2024-02-28 NOTE — Progress Notes (Deleted)
   There were no vitals taken for this visit.   Subjective:    Patient ID: Michael Schmitt, male    DOB: 03-08-1971, 53 y.o.   MRN: [id number]  HPI: Michael Schmitt is a 53 y.o. male  No chief complaint on file.  HYPERTENSION {Blank single:19197::without,with} Chronic Kidney Disease Hypertension status: {Blank single:19197::controlled,uncontrolled,better,worse,exacerbated,stable}  Satisfied with current treatment? {Blank single:19197::yes,no} Duration of hypertension: {Blank single:19197::chronic,months,years} BP monitoring frequency:  {Blank single:19197::not checking,rarely,daily,weekly,monthly,a few times a day,a few times a week,a few times a month} BP range:  BP medication side effects:  {Blank single:19197::yes,no} Medication compliance: {Blank single:19197::excellent compliance,good compliance,fair compliance,poor compliance} Previous BP meds:{Blank multiple:19196::none,amlodipine ,amlodipine /benazepril,atenolol ,benazepril,benazepril/HCTZ,bisoprolol (bystolic),carvedilol ,chlorthalidone,clonidine ,diltiazem,exforge HCT,HCTZ,irbesartan (avapro),labetalol ,lisinopril ,lisinopril -HCTZ,losartan (cozaar),methyldopa,nifedipine,olmesartan (benicar),olmesartan-HCTZ,quinapril,ramipril,spironalactone,tekturna,valsartan,valsartan-HCTZ,verapamil } Aspirin : {Blank single:19197::yes,no} Recurrent headaches: {Blank single:19197::yes,no} Visual changes: {Blank single:19197::yes,no} Palpitations: {Blank single:19197::yes,no} Dyspnea: {Blank single:19197::yes,no} Chest pain: {Blank single:19197::yes,no} Lower extremity edema: {Blank single:19197::yes,no} Dizzy/lightheaded: {Blank single:19197::yes,no}  Relevant past medical, surgical, family and social history reviewed and updated as indicated. Interim medical history since our last visit reviewed. Allergies and  medications reviewed and updated.  Review of Systems  Per HPI unless specifically indicated above     Objective:    There were no vitals taken for this visit.  Wt Readings from Last 3 Encounters:  02/26/24 203 lb (92.1 kg)  02/12/24 203 lb 6.4 oz (92.3 kg)  01/03/24 198 lb 12.8 oz (90.2 kg)    Physical Exam  Results for orders placed or performed in visit on 01/03/24  Microalbumin, Urine Waived (STAT)   Collection Time: 01/03/24 11:08 AM  Result Value Ref Range   Microalb, Ur Waived 80 (H) 0 - 19 mg/L   Creatinine, Urine Waived 100 10 - 300 mg/dL   Microalb/Creat Ratio 30-300 (H) <30 mg/g  TSH   Collection Time: 01/03/24 11:17 AM  Result Value Ref Range   TSH 1.400 0.450 - 4.500 uIU/mL  HgB A1c   Collection Time: 01/03/24 11:17 AM  Result Value Ref Range   Hgb A1c MFr Bld 8.2 (H) 4.8 - 5.6 %   Est. average glucose Bld gHb Est-mCnc 189 mg/dL  Comp Met (CMET)   Collection Time: 01/03/24 11:17 AM  Result Value Ref Range   Glucose 158 (H) 70 - 99 mg/dL   BUN 11 6 - 24 mg/dL   Creatinine, Ser 9.18 0.76 - 1.27 mg/dL   eGFR 892 >40 fO/fpw/8.26   BUN/Creatinine Ratio 14 9 - 20   Sodium 138 134 - 144 mmol/L   Potassium 4.8 3.5 - 5.2 mmol/L   Chloride 100 96 - 106 mmol/L   CO2 23 20 - 29 mmol/L   Calcium  9.4 8.7 - 10.2 mg/dL   Total Protein 6.3 6.0 - 8.5 g/dL   Albumin 4.5 3.8 - 4.9 g/dL   Globulin, Total 1.8 1.5 - 4.5 g/dL   Bilirubin Total <9.7 0.0 - 1.2 mg/dL   Alkaline Phosphatase 121 47 - 123 IU/L   AST 17 0 - 40 IU/L   ALT 17 0 - 44 IU/L      Assessment & Plan:   Problem List Items Addressed This Visit   None    Follow up plan: No follow-ups on file.

## 2024-02-29 NOTE — Telephone Encounter (Signed)
 Patient has been called and a message left for them to return the call to the office. Ok for E2C2 to review if/when they return the call. Please do not transfer to CAL rather send a CRM if needed only.    Please review with patient that pending provider sending refill he should be allowed to pickup the Lunesta .

## 2024-03-03 NOTE — Discharge Instructions (Signed)

## 2024-03-03 NOTE — Telephone Encounter (Signed)
 DPR verified to speak to patient's sister and number provided for calling on referral.

## 2024-03-04 ENCOUNTER — Inpatient Hospital Stay
Admission: RE | Admit: 2024-03-04 | Discharge: 2024-03-04 | Attending: Physical Medicine & Rehabilitation | Admitting: Physical Medicine & Rehabilitation

## 2024-03-04 ENCOUNTER — Other Ambulatory Visit

## 2024-03-05 NOTE — Discharge Instructions (Signed)

## 2024-03-06 ENCOUNTER — Inpatient Hospital Stay
Admission: RE | Admit: 2024-03-06 | Discharge: 2024-03-06 | Attending: Physical Medicine & Rehabilitation | Admitting: Physical Medicine & Rehabilitation

## 2024-03-06 DIAGNOSIS — M5412 Radiculopathy, cervical region: Secondary | ICD-10-CM

## 2024-03-07 ENCOUNTER — Telehealth: Payer: Self-pay | Admitting: Pediatrics

## 2024-03-07 DIAGNOSIS — F5104 Psychophysiologic insomnia: Secondary | ICD-10-CM

## 2024-03-07 NOTE — Telephone Encounter (Signed)
 Copied from CRM #[id number]. Topic: Clinical - Medication Question >> Mar 07, 2024  4:29 PM Lauren C wrote: Reason for CRM: Pt said his blood pressure medication that starts with an A was increased from 5 mg at his last visit and his blood pressure has continued to be high (He says ~150/90). He wants to know if his medication should be changed/increased again. #[id number]

## 2024-03-07 NOTE — Telephone Encounter (Signed)
 Copied from CRM #[id number]. Topic: Referral - Question >> Mar 07, 2024  4:26 PM Lauren C wrote: Reason for CRM: Pt called for sleep study referral and I provided info for [facility]. He says that [place] is too far from him and he thought there would be a way to do a sleep study at the hospital here in [place]. Requesting the referral be transferred to closer location. Please return pts call once complete at [phone]

## 2024-03-11 ENCOUNTER — Other Ambulatory Visit: Payer: Self-pay

## 2024-03-11 NOTE — Telephone Encounter (Signed)
 Patient understands the below from provider. Will bring his home machine to check accuracy.   He also then advises that he can not do the sleep study testing as he has no transportation to [place]. Advised I will ask referral team for assistance to either schedule closer to home or a home study.

## 2024-03-13 ENCOUNTER — Other Ambulatory Visit: Payer: Self-pay | Admitting: Pediatrics

## 2024-03-13 ENCOUNTER — Ambulatory Visit: Admitting: Pediatrics

## 2024-03-13 DIAGNOSIS — G894 Chronic pain syndrome: Secondary | ICD-10-CM

## 2024-03-13 DIAGNOSIS — F5104 Psychophysiologic insomnia: Secondary | ICD-10-CM

## 2024-03-13 NOTE — Telephone Encounter (Unsigned)
 Copied from CRM #[id number]. Topic: Clinical - Medication Refill >> Mar 13, 2024  9:30 AM Darshell M wrote: Medication:  eszopiclone  3 MG TABS  amitriptyline  (ELAVIL ) 50 MG tablet   Has the patient contacted their pharmacy? Yes (Agent: If no, request that the patient contact the pharmacy for the refill. If patient does not wish to contact the pharmacy document the reason why and proceed with request.) (Agent: If yes, when and what did the pharmacy advise?)  This is the patient's preferred pharmacy:  [facility] [address]), [place] - [address] [place]) [place] [address] Phone: [phone] Fax: [phone]  Is this the correct pharmacy for this prescription? Yes If no, delete pharmacy and type the correct one.   Has the prescription been filled recently? Yes  Is the patient out of the medication? No  Has the patient been seen for an appointment in the last year OR does the patient have an upcoming appointment? Yes  Can we respond through MyChart? No  Agent: Please be advised that Rx refills may take up to 3 business days. We ask that you follow-up with your pharmacy.

## 2024-03-17 NOTE — Telephone Encounter (Signed)
 Too soon for refill.  Requested Prescriptions  Pending Prescriptions Disp Refills   Eszopiclone  3 MG TABS 30 tablet 2    Sig: Take 1 tablet (3 mg total) by mouth at bedtime as needed. Take immediately before bedtime     Not Delegated - Psychiatry:  Anxiolytics/Hypnotics Failed - 03/17/2024 12:22 PM      Failed - This refill cannot be delegated      Failed - Urine Drug Screen completed in last 360 days      Passed - Valid encounter within last 6 months    Recent Outpatient Visits           1 month ago Type 2 diabetes mellitus without complication, without long-term current use of insulin  [facility])   [place] [facility] Herold Hadassah SQUIBB, MD   2 months ago Type 2 diabetes mellitus with diabetic chronic kidney disease, unspecified CKD stage, unspecified whether long term insulin  use [facility])   [place] [facility] Herold Hadassah SQUIBB, MD   5 months ago Chronic pain syndrome   [place] [facility] Herold Hadassah SQUIBB, MD               amitriptyline  (ELAVIL ) 50 MG tablet 90 tablet 2    Sig: Take 3 tablets (150 mg total) by mouth at bedtime.     Psychiatry:  Antidepressants - Heterocyclics (TCAs) Passed - 03/17/2024 12:22 PM      Passed - Completed PHQ-2 or PHQ-9 in the last 360 days      Passed - Valid encounter within last 6 months    Recent Outpatient Visits           1 month ago Type 2 diabetes mellitus without complication, without long-term current use of insulin  [facility])   [place] [facility] Herold Hadassah SQUIBB, MD   2 months ago Type 2 diabetes mellitus with diabetic chronic kidney disease, unspecified CKD stage, unspecified whether long term insulin  use [facility])   [place] [facility] Herold Hadassah SQUIBB, MD   5 months ago Chronic pain syndrome    [facility] Herold Hadassah SQUIBB, MD

## 2024-03-18 NOTE — Patient Instructions (Incomplete)
 Insomnia Insomnia is a sleep disorder that makes it difficult to fall asleep or stay asleep. Insomnia can cause fatigue, low energy, difficulty concentrating, mood swings, and poor performance at work or school. There are three different ways to classify insomnia: Difficulty falling asleep. Difficulty staying asleep. Waking up too early in the morning. Any type of insomnia can be long-term (chronic) or short-term (acute). Both are common. Short-term insomnia usually lasts for 3 months or less. Chronic insomnia occurs at least three times a week for longer than 3 months. What are the causes? Insomnia may be caused by another condition, situation, or substance, such as: Having certain mental health conditions, such as anxiety and depression. Using caffeine, alcohol , tobacco, or drugs. Having gastrointestinal conditions, such as gastroesophageal reflux disease (GERD). Having certain medical conditions. These include: Asthma. Alzheimer's disease. Stroke. Chronic pain. An overactive thyroid  gland (hyperthyroidism). Other sleep disorders, such as restless legs syndrome and sleep apnea. Menopause. Sometimes, the cause of insomnia may not be known. What increases the risk? Risk factors for insomnia include: Gender. Females are affected more often than males. Age. Insomnia is more common as people get older. Stress and certain medical and mental health conditions. Lack of exercise. Having an irregular work schedule. This may include working night shifts and traveling between different time zones. What are the signs or symptoms? If you have insomnia, the main symptom is having trouble falling asleep or having trouble staying asleep. This may lead to other symptoms, such as: Feeling tired or having low energy. Feeling nervous about going to sleep. Not feeling rested in the morning. Having trouble concentrating. Feeling irritable, anxious, or depressed. How is this diagnosed? This condition  may be diagnosed based on: Your symptoms and medical history. Your health care provider may ask about: Your sleep habits. Any medical conditions you have. Your mental health. A physical exam. How is this treated? Treatment for insomnia depends on the cause. Treatment may focus on treating an underlying condition that is causing the insomnia. Treatment may also include: Medicines to help you sleep. Counseling or therapy. Lifestyle adjustments to help you sleep better. Follow these instructions at home: Eating and drinking  Limit or avoid alcohol , caffeinated beverages, and products that contain nicotine and tobacco, especially close to bedtime. These can disrupt your sleep. Do not eat a large meal or eat spicy foods right before bedtime. This can lead to digestive discomfort that can make it hard for you to sleep. Sleep habits  Keep a sleep diary to help you and your health care provider figure out what could be causing your insomnia. Write down: When you sleep. When you wake up during the night. How well you sleep and how rested you feel the next day. Any side effects of medicines you are taking. What you eat and drink. Make your bedroom a dark, comfortable place where it is easy to fall asleep. Put up shades or blackout curtains to block light from outside. Use a white noise machine to block noise. Keep the temperature cool. Limit screen use before bedtime. This includes: Not watching TV. Not using your smartphone, tablet, or computer. Stick to a routine that includes going to bed and waking up at the same times every day and night. This can help you fall asleep faster. Consider making a quiet activity, such as reading, part of your nighttime routine. Try to avoid taking naps during the day so that you sleep better at night. Get out of bed if you are still awake after  15 minutes of trying to sleep. Keep the lights down, but try reading or doing a quiet activity. When you feel  sleepy, go back to bed. General instructions Take over-the-counter and prescription medicines only as told by your health care provider. Exercise regularly as told by your health care provider. However, avoid exercising in the hours right before bedtime. Use relaxation techniques to manage stress. Ask your health care provider to suggest some techniques that may work well for you. These may include: Breathing exercises. Routines to release muscle tension. Visualizing peaceful scenes. Make sure that you drive carefully. Do not drive if you feel very sleepy. Keep all follow-up visits. This is important. Contact a health care provider if: You are tired throughout the day. You have trouble in your daily routine due to sleepiness. You continue to have sleep problems, or your sleep problems get worse. Get help right away if: You have thoughts about hurting yourself or someone else. Get help right away if you feel like you may hurt yourself or others, or have thoughts about taking your own life. Go to your nearest emergency room or: Call 911. Call the National Suicide Prevention Lifeline at 2232757840 or 988. This is open 24 hours a day. Text the Crisis Text Line at 657-529-4371. Summary Insomnia is a sleep disorder that makes it difficult to fall asleep or stay asleep. Insomnia can be long-term (chronic) or short-term (acute). Treatment for insomnia depends on the cause. Treatment may focus on treating an underlying condition that is causing the insomnia. Keep a sleep diary to help you and your health care provider figure out what could be causing your insomnia. This information is not intended to replace advice given to you by your health care provider. Make sure you discuss any questions you have with your health care provider. Document Revised: 02/21/2021 Document Reviewed: 02/21/2021 Elsevier Patient Education  2024 [employer].

## 2024-03-18 NOTE — Telephone Encounter (Signed)
 Copied from CRM [id number]. Topic: Referral - Status >> Mar 18, 2024  9:24 AM Jeoffrey H wrote: Reason for CRM:  Patient wanted to check the status of his home sleep study.    [phone]

## 2024-03-18 NOTE — Telephone Encounter (Unsigned)
 Copied from CRM [id number]. Topic: Clinical - Prescription Issue >> Mar 18, 2024  9:22 AM Amber H wrote: Reason for CRM: Patient stated eszopiclone  3 MG TABS and amitriptyline  (ELAVIL ) 50 MG tablet is not working for him anymore. Stated he use to take Seroquel  800MG S in the past also worked for a little while and then stopped. Wants to know if the he could prescribed something else. He has not slept in one week.    [phone]

## 2024-03-19 ENCOUNTER — Ambulatory Visit: Admitting: Nurse Practitioner

## 2024-03-19 ENCOUNTER — Telehealth: Payer: Self-pay

## 2024-03-19 NOTE — Telephone Encounter (Signed)
 New referral in.Marland Kitchen

## 2024-03-19 NOTE — Telephone Encounter (Signed)
 Patient had MyChart we attempted to send a link, patient never logged on. Tried to call the patient multiple times with no success.

## 2024-03-19 NOTE — Telephone Encounter (Unsigned)
 Copied from CRM #[id number]. Topic: Appointments - Scheduling Inquiry for Clinic >> Mar 19, 2024 10:11 AM Shanda MATSU wrote: Reason for CRM: Patient called in due to missing virtual visit he had scheduled for today because he does not have access to Mychart, patient stated he did not recv phone calls that were made to him either, I offered patient next avail appt but patient stated he can only come on Tuesdays and Thursdays which next avail appt for one of those days is not until 04/22/2024, patient is wanting to know if he can somehow be seen sooner. Patient also doesn't understand why provider needs to see him to be prescribed Seroquel  as this is a med he has taken before, is req a call back.

## 2024-03-19 NOTE — Telephone Encounter (Signed)
 Attempted to reach patient to get him added to the schedule for today at 11am, That is the only opening we have available. If patient calls back after that time. He will need to reschedule his acute appt for another day.

## 2024-04-02 ENCOUNTER — Telehealth: Payer: Self-pay

## 2024-04-02 ENCOUNTER — Other Ambulatory Visit: Payer: Self-pay | Admitting: Pediatrics

## 2024-04-02 DIAGNOSIS — E119 Type 2 diabetes mellitus without complications: Secondary | ICD-10-CM

## 2024-04-02 NOTE — Telephone Encounter (Signed)
 Copied from CRM [id number]. Topic: Clinical - Medication Refill >> Apr 02, 2024  2:02 PM Yolanda T wrote: Medication: atorvastatin  (LIPITOR ) 20 MG tablet and carvedilol  (COREG ) 6.25 MG tablet  Has the patient contacted their pharmacy? No  This is the patient's preferred pharmacy:  [facility] [address] ([address]), [place] - [address] [place]) [place] [address] Phone: [phone] Fax: [phone]  Is this the correct pharmacy for this prescription? Yes  Has the prescription been filled recently? Yes  Is the patient out of the medication? Yes  Has the patient been seen for an appointment in the last year OR does the patient have an upcoming appointment? Yes  Can we respond through MyChart? No  Agent: Please be advised that Rx refills may take up to 3 business days. We ask that you follow-up with your pharmacy.

## 2024-04-02 NOTE — Telephone Encounter (Signed)
 Referral has already been sent to [facility] so that he can complete his needed sleep study at home.

## 2024-04-02 NOTE — Telephone Encounter (Signed)
 Copied from CRM #[id number]. Topic: Referral - Status >> Apr 02, 2024  1:59 PM Myrick T wrote: Reason for CRM: patient said he can not go to [facility] for the sleep study due to transportation. Patient is requesting a referral for sleep in [place].

## 2024-04-02 NOTE — Telephone Encounter (Signed)
 Copied from CRM #[id number]. Topic: Appointments - Appointment Scheduling >> Apr 02, 2024  1:46 PM Myrick T wrote: Patient/patient representative is calling to schedule an appointment. Refer to attachments for appointment information. Patient said he was on Lunesta  months ago and he has not been able to get the refill.  He says he has not slept in over 2 weeks. Patient does not understand why he would need an appt to come in to tell someone he can't sleep. Please f/u with patient

## 2024-04-03 ENCOUNTER — Other Ambulatory Visit: Payer: Self-pay | Admitting: Pediatrics

## 2024-04-03 DIAGNOSIS — F5104 Psychophysiologic insomnia: Secondary | ICD-10-CM

## 2024-04-03 MED ORDER — CARVEDILOL 6.25 MG PO TABS: 6.2500 mg | ORAL_TABLET | Freq: Two times a day (BID) | ORAL | 0 refills | Status: AC

## 2024-04-03 MED ORDER — ATORVASTATIN CALCIUM 20 MG PO TABS: 20.0000 mg | ORAL_TABLET | Freq: Every day | ORAL | 1 refills | Status: AC

## 2024-04-03 NOTE — Telephone Encounter (Signed)
 Copied from CRM [id number]. Topic: Clinical - Medication Refill >> Apr 03, 2024  2:22 PM Delon DASEN wrote: Medication: eszopiclone  3 MG TABS  Has the patient contacted their pharmacy? Yes (Agent: If no, request that the patient contact the pharmacy for the refill. If patient does not wish to contact the pharmacy document the reason why and proceed with request.) (Agent: If yes, when and what did the pharmacy advise?)  This is the patient's preferred pharmacy:  [facility] [address] ([address]), [place] - [address] [place]) [place] [address] Phone: [phone] Fax: [phone]  Is this the correct pharmacy for this prescription? Yes If no, delete pharmacy and type the correct one.   Has the prescription been filled recently? Yes  Is the patient out of the medication? Yes  Has the patient been seen for an appointment in the last year OR does the patient have an upcoming appointment? Yes  Can we respond through MyChart? Yes  Agent: Please be advised that Rx refills may take up to 3 business days. We ask that you follow-up with your pharmacy.

## 2024-04-03 NOTE — Telephone Encounter (Signed)
 Patient has been called and a message left for them to return the call to the office. Ok for E2C2 to review if/when they return the call. Please do not transfer to CAL rather send a CRM if needed only.   Please review message from J. Cannady with patient.

## 2024-04-03 NOTE — Telephone Encounter (Signed)
 Requested medications are due for refill today.  yes  Requested medications are on the active medications list.  yes  Last refill. 11/01/2022 #60 0 rf  Future visit scheduled.   yes  Notes to clinic.  Rx signed by Dr. [place]. Rx is more than 52 year old.    Requested Prescriptions  Pending Prescriptions Disp Refills   carvedilol  (COREG ) 6.25 MG tablet 60 tablet 0    Sig: Take 1 tablet (6.25 mg total) by mouth 2 (two) times daily with a meal.     Cardiovascular: Beta Blockers 3 Failed - 04/03/2024  1:55 PM      Failed - Last BP in normal range    BP Readings from Last 1 Encounters:  02/12/24 (!) 139/91         Passed - Cr in normal range and within 360 days    Creatinine  Date Value Ref Range Status  03/18/2014 1.02 0.60 - 1.30 mg/dL Final   Creatinine, Ser  Date Value Ref Range Status  01/03/2024 0.81 0.76 - 1.27 mg/dL Final   Creatinine, Urine  Date Value Ref Range Status  01/24/2019 53 mg/dL Final    Comment:    Performed at [facility], [address] [place], [place] [address]         Passed - AST in normal range and within 360 days    AST  Date Value Ref Range Status  01/03/2024 17 0 - 40 IU/L Final   SGOT(AST)  Date Value Ref Range Status  03/18/2014 44 (H) 15 - 37 Unit/L Final         Passed - ALT in normal range and within 360 days    ALT  Date Value Ref Range Status  01/03/2024 17 0 - 44 IU/L Final   SGPT (ALT)  Date Value Ref Range Status  03/18/2014 75 (H) U/L Final    Comment:    14-63 NOTE: New Reference Range 10/14/13          Passed - Last Heart Rate in normal range    Pulse Readings from Last 1 Encounters:  02/12/24 83         Passed - Valid encounter within last 6 months    Recent Outpatient Visits           1 month ago Type 2 diabetes mellitus without complication, without long-term current use of insulin  (HCC)   [place] [facility] Herold Hadassah SQUIBB, MD   3 months ago Type 2 diabetes mellitus with  diabetic chronic kidney disease, unspecified CKD stage, unspecified whether long term insulin  use [facility])   [place] [facility] Herold Hadassah SQUIBB, MD   6 months ago Chronic pain syndrome   [place] [facility] Herold Hadassah SQUIBB, MD              Signed Prescriptions Disp Refills   atorvastatin  (LIPITOR ) 20 MG tablet 90 tablet 1    Sig: Take 1 tablet (20 mg total) by mouth daily.     Cardiovascular:  Antilipid - Statins Failed - 04/03/2024  1:55 PM      Failed - Lipid Panel in normal range within the last 12 months    Cholesterol, Total  Date Value Ref Range Status  10/04/2023 168 100 - 199 mg/dL Final   LDL Chol Calc (NIH)  Date Value Ref Range Status  10/04/2023 94 0 - 99 mg/dL Final   HDL  Date Value Ref Range Status  10/04/2023 40 >39 mg/dL Final   Triglycerides  Date Value Ref Range Status  10/04/2023 201 (H) 0 - 149 mg/dL Final         Passed - Patient is not pregnant      Passed - Valid encounter within last 12 months    Recent Outpatient Visits           1 month ago Type 2 diabetes mellitus without complication, without long-term current use of insulin  (HCC)   [place] [facility] Herold Hadassah SQUIBB, MD   3 months ago Type 2 diabetes mellitus with diabetic chronic kidney disease, unspecified CKD stage, unspecified whether long term insulin  use [facility])   [place] [facility] Herold Hadassah SQUIBB, MD   6 months ago Chronic pain syndrome   [place] [facility] Herold Hadassah SQUIBB, MD

## 2024-04-03 NOTE — Telephone Encounter (Signed)
 Requested Prescriptions  Pending Prescriptions Disp Refills   atorvastatin  (LIPITOR ) 20 MG tablet 90 tablet 1    Sig: Take 1 tablet (20 mg total) by mouth daily.     Cardiovascular:  Antilipid - Statins Failed - 04/03/2024  1:55 PM      Failed - Lipid Panel in normal range within the last 12 months    Cholesterol, Total  Date Value Ref Range Status  10/04/2023 168 100 - 199 mg/dL Final   LDL Chol Calc (NIH)  Date Value Ref Range Status  10/04/2023 94 0 - 99 mg/dL Final   HDL  Date Value Ref Range Status  10/04/2023 40 >39 mg/dL Final   Triglycerides  Date Value Ref Range Status  10/04/2023 201 (H) 0 - 149 mg/dL Final         Passed - Patient is not pregnant      Passed - Valid encounter within last 12 months    Recent Outpatient Visits           1 month ago Type 2 diabetes mellitus without complication, without long-term current use of insulin  (HCC)   [place] [facility] Herold Hadassah SQUIBB, MD   3 months ago Type 2 diabetes mellitus with diabetic chronic kidney disease, unspecified CKD stage, unspecified whether long term insulin  use (HCC)   [place] [facility] Herold Hadassah SQUIBB, MD   6 months ago Chronic pain syndrome   [place] [facility] Herold Hadassah SQUIBB, MD               carvedilol  (COREG ) 6.25 MG tablet 60 tablet 0    Sig: Take 1 tablet (6.25 mg total) by mouth 2 (two) times daily with a meal.     Cardiovascular: Beta Blockers 3 Failed - 04/03/2024  1:55 PM      Failed - Last BP in normal range    BP Readings from Last 1 Encounters:  02/12/24 (!) 139/91         Passed - Cr in normal range and within 360 days    Creatinine  Date Value Ref Range Status  03/18/2014 1.02 0.60 - 1.30 mg/dL Final   Creatinine, Ser  Date Value Ref Range Status  01/03/2024 0.81 0.76 - 1.27 mg/dL Final   Creatinine, Urine  Date Value Ref Range Status  01/24/2019 53 mg/dL Final    Comment:    Performed at [facility],  [address] [place], [place] [address]         Passed - AST in normal range and within 360 days    AST  Date Value Ref Range Status  01/03/2024 17 0 - 40 IU/L Final   SGOT(AST)  Date Value Ref Range Status  03/18/2014 44 (H) 15 - 37 Unit/L Final         Passed - ALT in normal range and within 360 days    ALT  Date Value Ref Range Status  01/03/2024 17 0 - 44 IU/L Final   SGPT (ALT)  Date Value Ref Range Status  03/18/2014 75 (H) U/L Final    Comment:    14-63 NOTE: New Reference Range 10/14/13          Passed - Last Heart Rate in normal range    Pulse Readings from Last 1 Encounters:  02/12/24 83         Passed - Valid encounter within last 6 months    Recent Outpatient Visits  1 month ago Type 2 diabetes mellitus without complication, without long-term current use of insulin  [facility])   [place] [facility] Herold Hadassah SQUIBB, MD   3 months ago Type 2 diabetes mellitus with diabetic chronic kidney disease, unspecified CKD stage, unspecified whether long term insulin  use [facility])   [place] [facility] Herold Hadassah SQUIBB, MD   6 months ago Chronic pain syndrome   [facility] Herold Hadassah SQUIBB, MD

## 2024-04-03 NOTE — Telephone Encounter (Unsigned)
 Copied from CRM [id number]. Topic: General - Other >> Apr 03, 2024  2:23 PM Delon DASEN wrote: Reason for CRM: patient returned call, advised of referral for sleep study

## 2024-04-04 ENCOUNTER — Telehealth: Payer: Self-pay | Admitting: Pediatrics

## 2024-04-04 NOTE — Telephone Encounter (Signed)
 Requested medication (s) are due for refill today: yes  Requested medication (s) are on the active medication list: yes  Last refill:  12/19/23  Future visit scheduled: {Yes  Notes to clinic:  Unable to refill per protocol, cannot delegate.      Requested Prescriptions  Pending Prescriptions Disp Refills   Eszopiclone  3 MG TABS 30 tablet 2    Sig: Take 1 tablet (3 mg total) by mouth at bedtime as needed. Take immediately before bedtime     Not Delegated - Psychiatry:  Anxiolytics/Hypnotics Failed - 04/04/2024 11:47 AM      Failed - This refill cannot be delegated      Failed - Urine Drug Screen completed in last 360 days      Passed - Valid encounter within last 6 months    Recent Outpatient Visits           1 month ago Type 2 diabetes mellitus without complication, without long-term current use of insulin  [facility])   [place] [facility] Herold Hadassah SQUIBB, MD   3 months ago Type 2 diabetes mellitus with diabetic chronic kidney disease, unspecified CKD stage, unspecified whether long term insulin  use [facility])   [place] [facility] Herold Hadassah SQUIBB, MD   6 months ago Chronic pain syndrome   [place] [facility] Herold Hadassah SQUIBB, MD

## 2024-04-04 NOTE — Telephone Encounter (Signed)
 Me    04/03/24  1:54 PM Note Patient has been called and a message left for them to return the call to the office. Ok for E2C2 to review if/when they return the call. Please do not transfer to CAL rather send a CRM if needed only.     Please review message from J. Cannady with patient.        Cannady, Jolene T, NP to Me     04/02/24  8:14 PM Please alert Michael Schmitt that it is because this is a controlled substance and other providers in office have not met Michael Schmitt, so will need visit for refills. FYI he is scheduled in office 04/17/24, but he has also canceled or no showed multiple visits since 02/08/24.

## 2024-04-04 NOTE — Telephone Encounter (Signed)
 [facility] Requests   Eszopiclone  3 MG TABS Taking as Needed Taking Differently Not Taking Unknown           3 mg, At bedtime PRN

## 2024-04-07 ENCOUNTER — Other Ambulatory Visit: Payer: Self-pay

## 2024-04-07 DIAGNOSIS — F5104 Psychophysiologic insomnia: Secondary | ICD-10-CM

## 2024-04-08 ENCOUNTER — Other Ambulatory Visit: Payer: Self-pay

## 2024-04-08 ENCOUNTER — Ambulatory Visit: Admitting: Pediatrics

## 2024-04-08 ENCOUNTER — Encounter: Admitting: Nurse Practitioner

## 2024-04-08 NOTE — Patient Instructions (Signed)
 Juliane DASEN Seabury - I have attempted to call you three times but have been unsuccessful in reaching you. I work with Herold, Hadassah SQUIBB, MD (Inactive) and am calling to support your healthcare needs. If I can be of assistance to you, please contact me at [phone].     Thank you,  Joe Gee  Administrator, [employer] [phone]

## 2024-04-08 NOTE — Patient Outreach (Deleted)
 Complex Care Management   Visit Note  04/08/2024  Name:  Michael Schmitt MRN: [id number] DOB: 02-24-1971  Situation: Referral received for Complex Care Management related to {Criteria:32550} I obtained verbal consent from {CHL AMB Patient/Caregiver:28184}.  Visit completed with {CHL AMB Patient/Caregiver:28184}  {VISIT LOCATION:32553}  Background:   Past Medical History:  Diagnosis Date   Alcohol dependence, in remission (HCC) 06/11/2016   Formatting of this note might be different from the original. 2 DUIs in 2017     Anxiety    Anxiety    Arthritis    cerv. stenosis, spondylosis, HNP- lower back , has been followed in pain clinic, has  had injection s in cerv. area   Benzodiazepine withdrawal (HCC) 05/11/2022   withdrawing from Klonopin  (stopping on Friday 05/05/2022 after taking it regularly for some time), Continue CIWA.      CIWA from 05/07/22 1930 to 05/12/22 1930        Date and Time CIWA-Ar Total User    05/11/22 0200 0 TJ    05/11/22 0402 0 TJ    05/11/22 0800 8 CKA    05/11/22 1200 3 CKA    05/11/22 1600 1 CKA    05/11/22 2020 8 JJ    05/12/22 0114 0 JJ    05/12/22 0424 6 JJ    05/12/22 0833 9 MNV    Bipolar 1 disorder (HCC)    Blood dyscrasia    told that when he was young he was a free bleeder   CAD (coronary artery disease)    Cervical spondylosis without myelopathy 07/24/2014   Cervicogenic headache 07/24/2014   Chronic kidney disease    renal calculi- passed spontaneously   Chronic Suicidal ideation 09/15/2016   Depression    Diabetes mellitus without complication (HCC)    Drug-seeking behavior 06/11/2016   Follow Psychiatry recommendations.     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   Long term prescription  benzodiazepine use 11/21/2016   Mental disorder    MI, old    Myocardial infarct (HCC) 07/05/2016   03/2016 s/p 4 stents     RLS (restless legs syndrome)    detected on sleep study   Shortness of breath    Syncope 09/29/2019   Ventricular fibrillation (HCC) 04/05/2016    Assessment: Patient Reported Symptoms:  Cognitive        Neurological      HEENT        Cardiovascular      Respiratory      Endocrine      Gastrointestinal        Genitourinary      Integumentary      Musculoskeletal          Psychosocial            04/08/2024    PHQ2-9 Depression Screening   Little interest or pleasure in doing things    Feeling down, depressed, or hopeless    PHQ-2 - Total Score    Trouble falling or staying asleep, or sleeping too much    Feeling tired or having little energy    Poor appetite or overeating     Feeling bad about yourself - or that you are  a failure or have let yourself or your family down    Trouble concentrating on things, such as reading the newspaper or watching television    Moving or speaking so slowly that other people could have noticed.  Or the opposite - being so fidgety or restless that you have been moving around a lot more than usual    Thoughts that you would be better off dead, or hurting yourself in some way    PHQ2-9 Total Score    If you checked off any problems, how difficult have these problems made it for you to do your work, take care of things at home, or get along with other people    Depression Interventions/Treatment      There were no vitals filed for this visit.    Medications Reviewed Today   Medications were not reviewed in this encounter     Recommendation:   {RECOMMENDATONS:32554}  Follow Up Plan:   {FOLLOWUP:32559}  SIG ***

## 2024-04-13 DIAGNOSIS — E1169 Type 2 diabetes mellitus with other specified complication: Secondary | ICD-10-CM | POA: Insufficient documentation

## 2024-04-13 DIAGNOSIS — E538 Deficiency of other specified B group vitamins: Secondary | ICD-10-CM | POA: Insufficient documentation

## 2024-04-13 NOTE — Patient Instructions (Incomplete)
 Healthy Eating, Adult Healthy eating may help you get and keep a healthy body weight, reduce the risk of chronic disease, and live a long and productive life. It is important to follow a healthy eating pattern. Your nutritional and calorie needs should be met mainly by different nutrient-rich foods. What are tips for following this plan? Reading food labels Read labels and choose the following: Reduced or low sodium products. Juices with 100% fruit juice. Foods with low saturated fats (<3 g per serving) and high polyunsaturated and monounsaturated fats. Foods with whole grains, such as whole wheat, cracked wheat, brown rice, and wild rice. Whole grains that are fortified with folic acid . This is recommended for females who are pregnant or who want to become pregnant. Read labels and do not eat or drink the following: Foods or drinks with added sugars. These include foods that contain brown sugar, corn sweetener, corn syrup, dextrose , fructose, glucose, high-fructose corn syrup, honey, invert sugar, lactose, malt syrup, maltose, molasses, raw sugar, sucrose, trehalose, or turbinado sugar. Limit your intake of added sugars to less than 10% of your total daily calories. Do not eat more than the following amounts of added sugar per day: 6 teaspoons (25 g) for females. 9 teaspoons (38 g) for males. Foods that contain processed or refined starches and grains. Refined grain products, such as white flour, degermed cornmeal, white bread, and white rice. Shopping Choose nutrient-rich snacks, such as vegetables, whole fruits, and nuts. Avoid high-calorie and high-sugar snacks, such as potato chips, fruit snacks, and candy. Use oil-based dressings and spreads on foods instead of solid fats such as butter, margarine, sour cream, or cream cheese. Limit pre-made sauces, mixes, and instant products such as flavored rice, instant noodles, and ready-made pasta. Try more plant-protein sources, such as tofu,  tempeh, black beans, edamame, lentils, nuts, and seeds. Explore eating plans such as the Mediterranean diet or vegetarian diet. Try heart-healthy dips made with beans and healthy fats like hummus and guacamole. Vegetables go great with these. Cooking Use oil to saut or stir-fry foods instead of solid fats such as butter, margarine, or lard. Try baking, boiling, grilling, or broiling instead of frying. Remove the fatty part of meats before cooking. Steam vegetables in water  or broth. Meal planning  At meals, imagine dividing your plate into fourths: One-half of your plate is fruits and vegetables. One-fourth of your plate is whole grains. One-fourth of your plate is protein, especially lean meats, poultry, eggs, tofu, beans, or nuts. Include low-fat dairy as part of your daily diet. Lifestyle Choose healthy options in all settings, including home, work, school, restaurants, or stores. Prepare your food safely: Wash your hands after handling raw meats. Where you prepare food, keep surfaces clean by regularly washing with hot, soapy water . Keep raw meats separate from ready-to-eat foods, such as fruits and vegetables. Cook seafood, meat, poultry, and eggs to the recommended temperature. Get a food thermometer. Store foods at safe temperatures. In general: Keep cold foods at 34F (4.4C) or below. Keep hot foods at 134F (60C) or above. Keep your freezer at [facility] (-17.8C) or below. Foods are not safe to eat if they have been between the temperatures of 40-134F (4.4-60C) for more than 2 hours. What foods should I eat? Fruits Aim to eat 1-2 cups of fresh, canned (in natural juice), or frozen fruits each day. One cup of fruit equals 1 small apple, 1 large banana, 8 large strawberries, 1 cup (237 g) canned fruit,  cup (82 g) dried fruit,  or 1 cup (240 mL) 100% juice. Vegetables Aim to eat 2-4 cups of fresh and frozen vegetables each day, including different varieties and colors. One cup  of vegetables equals 1 cup (91 g) broccoli or cauliflower florets, 2 medium carrots, 2 cups (150 g) raw, leafy greens, 1 large tomato, 1 large bell pepper, 1 large sweet potato, or 1 medium white potato. Grains Aim to eat 5-10 ounce-equivalents of whole grains each day. Examples of 1 ounce-equivalent of grains include 1 slice of bread, 1 cup (40 g) ready-to-eat cereal, 3 cups (24 g) popcorn, or  cup (93 g) cooked rice. Meats and other proteins Try to eat 5-7 ounce-equivalents of protein each day. Examples of 1 ounce-equivalent of protein include 1 egg,  oz nuts (12 almonds, 24 pistachios, or 7 walnut halves), 1/4 cup (90 g) cooked beans, 6 tablespoons (90 g) hummus or 1 tablespoon (16 g) peanut butter. A cut of meat or fish that is the size of a deck of cards is about 3-4 ounce-equivalents (85 g). Of the protein you eat each week, try to have at least 8 sounce (227 g) of seafood. This is about 2 servings per week. This includes salmon, trout, herring, sardines, and anchovies. Dairy Aim to eat 3 cup-equivalents of fat-free or low-fat dairy each day. Examples of 1 cup-equivalent of dairy include 1 cup (240 mL) milk, 8 ounces (250 g) yogurt, 1 ounces (44 g) natural cheese, or 1 cup (240 mL) fortified soy milk. Fats and oils Aim for about 5 teaspoons (21 g) of fats and oils per day. Choose monounsaturated fats, such as canola and olive oils, mayonnaise made with olive oil or avocado oil, avocados, peanut butter, and most nuts, or polyunsaturated fats, such as sunflower, corn, and soybean oils, walnuts, pine nuts, sesame seeds, sunflower seeds, and flaxseed. Beverages Aim for 6 eight-ounce glasses of water  per day. Limit coffee to 3-5 eight-ounce cups per day. Limit caffeinated beverages that have added calories, such as soda and energy drinks. If you drink alcohol: Limit how much you have to: 0-1 drink a day if you are male. 0-2 drinks a day if you are male. Know how much alcohol is in your drink.  In the [place] one drink is one 12 oz bottle of beer (355 mL), one 5 oz glass of wine (148 mL), or one 1 oz glass of hard liquor (44 mL). Seasoning and other foods Try not to add too much salt to your food. Try using herbs and spices instead of salt. Try not to add sugar to food. This information is based on [place] nutrition guidelines. To learn more, visit [employer]. Exact amounts may vary. You may need different amounts. This information is not intended to replace advice given to you by your health care provider. Make sure you discuss any questions you have with your health care provider. Document Revised: 12/12/2021 Document Reviewed: 12/12/2021 Elsevier Patient Education  2024 [employer].

## 2024-04-17 ENCOUNTER — Encounter: Admitting: Nurse Practitioner

## 2024-04-17 DIAGNOSIS — F39 Unspecified mood [affective] disorder: Secondary | ICD-10-CM

## 2024-04-17 DIAGNOSIS — F3132 Bipolar disorder, current episode depressed, moderate: Secondary | ICD-10-CM

## 2024-04-17 DIAGNOSIS — E1169 Type 2 diabetes mellitus with other specified complication: Secondary | ICD-10-CM

## 2024-04-17 DIAGNOSIS — G894 Chronic pain syndrome: Secondary | ICD-10-CM

## 2024-04-17 DIAGNOSIS — E1159 Type 2 diabetes mellitus with other circulatory complications: Secondary | ICD-10-CM

## 2024-04-17 DIAGNOSIS — F5104 Psychophysiologic insomnia: Secondary | ICD-10-CM

## 2024-04-17 DIAGNOSIS — E1140 Type 2 diabetes mellitus with diabetic neuropathy, unspecified: Secondary | ICD-10-CM

## 2024-04-17 DIAGNOSIS — F1014 Alcohol abuse with alcohol-induced mood disorder: Secondary | ICD-10-CM

## 2024-04-17 DIAGNOSIS — F1310 Sedative, hypnotic or anxiolytic abuse, uncomplicated: Secondary | ICD-10-CM

## 2024-04-17 DIAGNOSIS — E538 Deficiency of other specified B group vitamins: Secondary | ICD-10-CM

## 2024-04-18 ENCOUNTER — Encounter: Admitting: Nurse Practitioner

## 2024-04-22 ENCOUNTER — Ambulatory Visit

## 2024-04-22 ENCOUNTER — Telehealth: Payer: Self-pay | Admitting: Emergency Medicine

## 2024-04-22 ENCOUNTER — Telehealth: Payer: Self-pay | Admitting: Pediatrics

## 2024-04-22 NOTE — Telephone Encounter (Signed)
 Called patient to get him scheduled for [facility], Patient was upset that he couldn't be seen before June. I explained to him that he has missed 3 previous TOC appts, so we could schedule next available in June. He disconnected call

## 2024-04-22 NOTE — Telephone Encounter (Signed)
 Called patient to complete Medicare AWV. Patient had questions about medications and not being able to get refills.  He states he has called the office several times.  I advised that Dr. Herold had left and he needs to see new provider. (Has no showed the last 3 TOC appointments) I was trying to explain this to patient.   He said he would find another doctor and hung up on me.   Medicare AWV cancelled.

## 2024-04-22 NOTE — Telephone Encounter (Signed)
 Copied from CRM #[id number]. Topic: Appointments - Scheduling Inquiry for Clinic >> Apr 22, 2024  1:40 PM [name] wrote: Reason for CRM: The patient is currently homeless and needs to schedule a TOC appointment with Darice. The patient has missed several previous TOC appointments. Please follow up with the patient at [phone]
